# Patient Record
Sex: Male | Born: 1945
Health system: Southern US, Community
[De-identification: ages and names within clinical notes are randomized; demographics above are authoritative.]

## PROBLEM LIST (undated history)

## (undated) DIAGNOSIS — G629 Polyneuropathy, unspecified: Secondary | ICD-10-CM

## (undated) DIAGNOSIS — C50919 Malignant neoplasm of unspecified site of unspecified female breast: Secondary | ICD-10-CM

## (undated) DIAGNOSIS — K219 Gastro-esophageal reflux disease without esophagitis: Secondary | ICD-10-CM

## (undated) DIAGNOSIS — I10 Essential (primary) hypertension: Secondary | ICD-10-CM

## (undated) DIAGNOSIS — R413 Other amnesia: Secondary | ICD-10-CM

## (undated) DIAGNOSIS — J40 Bronchitis, not specified as acute or chronic: Secondary | ICD-10-CM

## (undated) DIAGNOSIS — F419 Anxiety disorder, unspecified: Secondary | ICD-10-CM

## (undated) DIAGNOSIS — R519 Headache, unspecified: Secondary | ICD-10-CM

## (undated) DIAGNOSIS — C9000 Multiple myeloma not having achieved remission: Secondary | ICD-10-CM

## (undated) DIAGNOSIS — J189 Pneumonia, unspecified organism: Secondary | ICD-10-CM

## (undated) DIAGNOSIS — Z9289 Personal history of other medical treatment: Secondary | ICD-10-CM

## (undated) DIAGNOSIS — H3530 Unspecified macular degeneration: Secondary | ICD-10-CM

## (undated) DIAGNOSIS — C7951 Secondary malignant neoplasm of bone: Secondary | ICD-10-CM

## (undated) DIAGNOSIS — R059 Cough, unspecified: Secondary | ICD-10-CM

## (undated) DIAGNOSIS — C50929 Malignant neoplasm of unspecified site of unspecified male breast: Secondary | ICD-10-CM

## (undated) HISTORY — PX: OTHER SURGICAL HISTORY: SHX169

## (undated) HISTORY — DX: Gastro-esophageal reflux disease without esophagitis: K21.9

## (undated) HISTORY — DX: Essential (primary) hypertension: I10

## (undated) HISTORY — DX: Secondary malignant neoplasm of bone: C79.51

## (undated) HISTORY — DX: Malignant neoplasm of unspecified site of unspecified female breast: C50.919

## (undated) HISTORY — DX: Anxiety disorder, unspecified: F41.9

## (undated) HISTORY — PX: BACK SURGERY: SHX140

## (undated) HISTORY — PX: BREAST BIOPSY: SHX20

## (undated) HISTORY — PX: HERNIA REPAIR: SHX51

## (undated) HISTORY — DX: Malignant neoplasm of unspecified site of unspecified male breast: C50.929

## (undated) HISTORY — DX: Unspecified macular degeneration: H35.30

## (undated) HISTORY — DX: Multiple myeloma not having achieved remission: C90.00

## (undated) HISTORY — PX: COLONOSCOPY: SHX174

---

## 2001-06-17 ENCOUNTER — Encounter: Payer: Self-pay | Admitting: Neurosurgery

## 2001-06-17 ENCOUNTER — Inpatient Hospital Stay (HOSPITAL_COMMUNITY): Admission: RE | Admit: 2001-06-17 | Discharge: 2001-06-17 | Payer: Self-pay | Admitting: Neurosurgery

## 2001-06-28 ENCOUNTER — Encounter: Payer: Self-pay | Admitting: Neurosurgery

## 2001-06-28 ENCOUNTER — Encounter: Admission: RE | Admit: 2001-06-28 | Discharge: 2001-06-28 | Payer: Self-pay | Admitting: Neurosurgery

## 2001-08-11 ENCOUNTER — Encounter: Payer: Self-pay | Admitting: Neurosurgery

## 2001-08-11 ENCOUNTER — Encounter: Admission: RE | Admit: 2001-08-11 | Discharge: 2001-08-11 | Payer: Self-pay | Admitting: Neurosurgery

## 2009-12-28 DIAGNOSIS — C50929 Malignant neoplasm of unspecified site of unspecified male breast: Secondary | ICD-10-CM

## 2009-12-28 DIAGNOSIS — C50919 Malignant neoplasm of unspecified site of unspecified female breast: Secondary | ICD-10-CM

## 2009-12-28 HISTORY — DX: Malignant neoplasm of unspecified site of unspecified male breast: C50.929

## 2009-12-28 HISTORY — DX: Malignant neoplasm of unspecified site of unspecified female breast: C50.919

## 2010-01-21 ENCOUNTER — Other Ambulatory Visit: Payer: Self-pay | Admitting: Orthopedic Surgery

## 2010-01-21 ENCOUNTER — Encounter (INDEPENDENT_AMBULATORY_CARE_PROVIDER_SITE_OTHER): Payer: Self-pay | Admitting: Orthopedic Surgery

## 2010-01-21 ENCOUNTER — Inpatient Hospital Stay (HOSPITAL_COMMUNITY): Admission: RE | Admit: 2010-01-21 | Discharge: 2010-01-22 | Payer: Self-pay | Admitting: Orthopedic Surgery

## 2010-01-27 ENCOUNTER — Ambulatory Visit: Admission: RE | Admit: 2010-01-27 | Discharge: 2010-03-14 | Payer: Self-pay | Admitting: Radiation Oncology

## 2010-03-09 ENCOUNTER — Ambulatory Visit: Payer: Self-pay | Admitting: Cardiology

## 2010-10-07 ENCOUNTER — Ambulatory Visit: Payer: Self-pay | Admitting: Internal Medicine

## 2010-10-07 DIAGNOSIS — K5900 Constipation, unspecified: Secondary | ICD-10-CM | POA: Insufficient documentation

## 2010-10-07 DIAGNOSIS — K219 Gastro-esophageal reflux disease without esophagitis: Secondary | ICD-10-CM | POA: Insufficient documentation

## 2010-10-07 DIAGNOSIS — K625 Hemorrhage of anus and rectum: Secondary | ICD-10-CM

## 2010-10-08 ENCOUNTER — Encounter: Payer: Self-pay | Admitting: Internal Medicine

## 2010-10-27 ENCOUNTER — Ambulatory Visit (HOSPITAL_COMMUNITY)
Admission: RE | Admit: 2010-10-27 | Discharge: 2010-10-27 | Payer: Self-pay | Source: Home / Self Care | Admitting: Internal Medicine

## 2010-10-27 ENCOUNTER — Ambulatory Visit: Payer: Self-pay | Admitting: Internal Medicine

## 2011-01-27 NOTE — Assessment & Plan Note (Signed)
Summary: rectal bleeding,consult for tcs/ss   Visit Type:  Initial Consult Referring Elfrieda Espino:  [name] Wavie Hashimi:  [name]  CC:  rectal bleeding.  History of Present Illness: Johnny Lucas is a pleasant 65 year old male who presents today as a new patient at the request of Dr. [name] due to rectal bleeding. He was recently diagnosed with breast cancer in January of 2011, and he states this has metastasized to the bone. He reports he experienced an episode of severe constipation one month ago; he woke up nauseated at 1am. He states he finally had a BM later that day but noted large amount of bright red blood in stool. He states he had abdominal pain with this episode. He experienced two more incidences of brbpr, yet he has had none since. Reports no prior history of blood in stool. States he has dealt with constipation "all my life". Reports taking miralax once per week. BM every other day, reports hard stools. Denies abdominal pain currently. No change in weight, no loss of appetite. c/o nocturnal GERD that awakens him from sleeping almost every night. uses tums 2-3x week. No prior colon/EGD in past. Current labs from PCP wnl.   Current Problems (verified): 1)  Constipation  (ICD-564.00) 2)  Gerd  (ICD-530.81) 3)  Rectal Bleeding  (ICD-569.3)  Current Medications (verified): 1)  Metoprolol Tartrate 100 Mg Tabs (Metoprolol Tartrate) .... Take 1 Tablet By Mouth Once A Day 2)  Tamoxifen Citrate 20 Mg Tabs (Tamoxifen Citrate) .... Take 1 Tablet By Mouth Once A Day 3)  Aspirin 325 Mg Tabs (Aspirin) .... Take 1 Tablet By Mouth Once A Day 4)  Occuvite .... Take 1 Tablet By Mouth Once A Day 5)  Vitamin C  1000 Mg .... Once Daily 6)  Sustenex .... Once Daily 7)  Hydromet 5-1.5 Mg/37ml Syrp (Hydrocodone-Homatropine) .... As Needed 8)  Ventolin Hfa 108 (90 Base) Mcg/act Aers (Albuterol Sulfate) .... As Needed 9)  Zometa  Iv .... Once Monthly 10)  Nexium 40 Mg Cpdr  (Esomeprazole Magnesium) .Marland Kitchen.. 1 By Mouth Daily For Acid Reflux  Allergies (verified): No Known Drug Allergies  Past History:  Past Medical History: GERD Hypertension Chronic bronchitis Breast Cancer, metastasis to bone (Jan 2011)  Past Surgical History: Humerus pin C2-3 fusion right inguinal hernia repair with mesh  Family History: Mother: healthy, deceased Father: healthy, deceased siblings: healthy No FH of Colon Cancer:  Social History: Patient has never smoked.  Alcohol Use - yes 2-3 beers per month  Patient does not get regular exercise.  Smoking Status:  never Does Patient Exercise:  no  Review of Systems General:  Denies fever, chills, and anorexia. Eyes:  Denies blurring, irritation, and discharge. ENT:  Denies sore throat, hoarseness, and difficulty swallowing. CV:  Denies chest pains and dyspnea on exertion. Resp:  Denies dyspnea at rest and wheezing. GI:  Complains of nausea, indigestion/heartburn, and bloody BM's; denies difficulty swallowing, pain on swallowing, and abdominal pain; one episode of nausea with constipation, . GU:  Denies urinary burning, urinary frequency, and nocturnal urination. MS:  Denies joint pain / LOM, joint stiffness, and joint deformity. Derm:  Denies rash, itching, and dry skin. Neuro:  Denies weakness, difficulty walking, and headache. Psych:  Denies depression, anxiety, and memory loss.  Vital Signs:  Patient profile:   65 year old male Height:      69 inches Weight:      210 pounds BMI:     31.12 Temp:  98.0 degrees F oral Pulse rate:   66 / minute BP sitting:   130 / 82  (left arm) Cuff size:   large  Vitals Entered By: Johnny Merl LPN (October 11, 624THL 11:18 AM)  Physical Exam  General:  Well developed, well nourished, no acute distress. Eyes:  conjuctiva clear, non-icteric Nose:  No deformity, discharge,  or lesions. Mouth:  No deformity or lesions, dentition normal. Lungs:  Clear throughout to  auscultation. Heart:  Regular rate and rhythm; no murmurs, rubs,  or bruits. Abdomen:  normal bowel sounds, without guarding, without rebound, no tenderness, and no masses.   Msk:  Symmetrical with no gross deformities. Normal posture. Pulses:  Normal pulses noted. Extremities:  No clubbing, cyanosis, edema or deformities noted. Neurologic:  Alert and  oriented x4;  grossly normal neurologically.  Impression & Recommendations:  Problem # 1:  RECTAL BLEEDING (ICD-29.91) 65 year old male with new onset of hematochezia approximately one month ago associated with abdominal pain, +nausea, constipation. Experienced three episodes yet has had no further bleeding.   EGD and colonscopy to be performed by Dr. Bridgette Habermann in the near future.  I have discussed risks and benefits which include, but are not limited to, bleeding, infection, perforation, or medication reaction.  The patient agrees with this plan and consent will be obtained.  Orders: Consultation Level III [id number]) Consultation Level IV [id number])  Problem # 2:  GERD (ICD-57.16) 65 year old male with nocturnal GERD, uses TUMS 3x/week. No prior EGD performed. Nexium 40 mg daily, disp#30  to [facility]. 3 sample boxes given to patient.  See #1  Problem # 3:  CONSTIPATION (ICD-564.00)  history of constipation, currently has BM every other day.   Continue current regimen, increase fluid intake. Colonoscopy to be performed in near future.   Orders: Consultation Level IV [id number]) Prescriptions: NEXIUM 40 MG CPDR (ESOMEPRAZOLE MAGNESIUM) 1 by mouth daily for acid reflux  #31 x 5   Entered and Authorized by:   Andria Meuse FNP-BC   Signed by:   Andria Meuse FNP-BC on 10/07/2010   Method used:   Electronically to        [facility] (retail)       [address]       [place], [place]  [address]       Ph: [phone]       Fax: [phone]   RxID:   [id number]  #3 boxes of samples given

## 2011-01-27 NOTE — Letter (Signed)
Summary: TCS/EGD ORDER  TCS/EGD ORDER   Imported By: Sofie Rower 10/07/2010 12:09:43  _____________________________________________________________________  External Attachment:    Type:   Image     Comment:   External Document

## 2011-01-27 NOTE — Letter (Signed)
Summary: REFERRAL FROM DR Nadara Mustard  REFERRAL FROM DR Nadara Mustard   Imported By: Hoy Morn 10/08/2010 09:15:23  _____________________________________________________________________  External Attachment:    Type:   Image     Comment:   External Document

## 2011-03-15 LAB — DIFFERENTIAL
Basophils Absolute: 0 10*3/uL (ref 0.0–0.1)
Lymphocytes Relative: 28 % (ref 12–46)
Monocytes Absolute: 0.4 10*3/uL (ref 0.1–1.0)
Monocytes Relative: 8 % (ref 3–12)
Neutro Abs: 3.1 10*3/uL (ref 1.7–7.7)

## 2011-03-15 LAB — CBC
HCT: 39 % (ref 39.0–52.0)
MCHC: 35.3 g/dL (ref 30.0–36.0)
MCV: 97.6 fL (ref 78.0–100.0)
Platelets: 165 10*3/uL (ref 150–400)
RDW: 14.3 % (ref 11.5–15.5)

## 2011-03-15 LAB — COMPREHENSIVE METABOLIC PANEL
Albumin: 3.9 g/dL (ref 3.5–5.2)
BUN: 11 mg/dL (ref 6–23)
Creatinine, Ser: 0.97 mg/dL (ref 0.4–1.5)
Total Bilirubin: 0.6 mg/dL (ref 0.3–1.2)
Total Protein: 6.3 g/dL (ref 6.0–8.3)

## 2011-03-15 LAB — APTT: aPTT: 28 seconds (ref 24–37)

## 2011-05-15 NOTE — H&P (Signed)
[place]  Patient:    Johnny Lucas, Johnny Lucas                       MRN: [id number] Adm. Date:  [id number] Attending:  Minda Meo                         History and Physical  HISTORY OF PRESENT ILLNESS:  Mr. Braff is a gentleman who was seen by me in my office on two occasions because of neck pain with radiation down to the left shoulder and numbness involving the index, middle and ring fingers.  He was quite miserable.  He was seen by me, we started conservative treatment and in view of no improvement, we decided about surgery.  He was fairly reluctant, nevertheless, he did not know what to do.  His wife was present also and was of the opinion that it was up to him.  Finally, the patient realized that it is getting worse, the weakness is not any better and he is quite miserable. He denies any problem with the right upper extremity.  He denies any problem with bladder or bowel.  PAST MEDICAL HISTORY:  Inguinal hernia in 1994.  SOCIAL HISTORY:  He does not smoke.  He drinks socially.  FAMILY HISTORY:  Parents are 89 and 1 years old, in good condition.  REVIEW OF SYSTEMS:  The patient has macular degeneration which makes him legally blind.  He complains of back pain and some ringing in the ears.  PHYSICAL EXAMINATION:  HEENT:  Normal.  NECK:  He is able to flex but extension and lateralization produce pain that goes to the left shoulder.  LUNGS:  Clear.  HEART:  Heart sounds normal.  ABDOMEN:  Normal.  EXTREMITIES:  Normal pulses.  NEUROLOGIC:  Mental status normal.  Cranial nerves normal.  Strength 5/5 except that the left triceps is like 1/5 with hypotonia of the pectoralis major and weakening of the hypothenar muscle.  Reflexes are present and symmetrical, 2+, with absence of the left triceps.  Coordination normal.  Gait normal.  IMAGING STUDY:  The x-ray showed that indeed he has a large herniated disk at the level of  C6-C7 with displacement of the thecal sac and displacement of the C7 nerve root.  There is some spondylosis at the level of 4-5.  CLINICAL IMPRESSION:  Left C7 radiculopathy secondary to herniated disk.  RECOMMENDATION:  The patient is being admitted for further.  Procedure will be anterior cervical diskectomy using bone bank and a plate.  He knows about the risks such as infection, CSF leak, worsening of the pain, paralysis, failure of the graft, failure of the plate and need for further surgery. DD:  06/17/01 TD:  06/17/01 Job: [id number]

## 2011-05-15 NOTE — Op Note (Signed)
[place]  Patient:    Johnny Lucas, Johnny Lucas                       MRN: [id number] Proc. Date: 06/17/01 Adm. Date:  [id number] Attending:  Minda Meo                           Operative Report  PREOPERATIVE DIAGNOSIS:  Left C7 radiculopathy secondary to herniated disk at C6-7.  POSTOPERATIVE DIAGNOSIS:  Left C7 radiculopathy secondary to herniated disk at C6-7.  PROCEDURE: 1. Anterior C6-7 diskectomy, removal of a large fragment on the left side. 2. Microscope. 3. Iliac crest bone graft 7 mm, followed by Premier plate.  SURGEON:  Zigmund Daniel. Joya Salm, M.D.  ASSISTANT:  _____.  CLINICAL HISTORY:  Mr. Ferrin is a gentleman complaining of neck and left upper extremity pain for several weeks.  He already has atrophy of the left side secondary thoracic grade 1 over 5 with hypotonia of the pectoralis major.  MRI showed a large herniated disk at the level of C6-7 central and to to the left.  The patient failed with conservative treatment.  In view of no improvement and worsening of the pain, he decided to go ahead with surgery.  DESCRIPTION OF PROCEDURE:  The patient was taken to the OR and after intubation, the left side of the neck was prepped with Betadine.  A transverse incision was made through the skin, and the dissection was carried down all the way to through the platysma, down to the cervical spine.  Because of big shoulders, we used two needles.  One was at the level of 5-6, the other one was at the level of 6-7.  We opened the anterior ligament at the level of C6-7, and we brought the microscope into the area.  With the microcurette, with the drill, we removed quite a bit of degenerative disk disease.  Then there was an opening in the posterior ligament, and there were like three or four pieces of disk going into the left side.  Decompression of the C7 nerve root as well as decompression of the right side as well as the spinal cord  was made.  Then the end plates were drilled, and a 7 mm bone graft was inserted. This was followed by a Premier plate using four screws.  Lateral C-spine showed good position of the graft and the plate.  From then on, the area was irrigated and closed with Vicryl and Steri-Strips. DD:  06/17/01 TD:  06/17/01 Job: [id number]

## 2011-08-13 ENCOUNTER — Encounter: Payer: Self-pay | Admitting: Gastroenterology

## 2011-08-13 ENCOUNTER — Other Ambulatory Visit: Payer: Self-pay | Admitting: Gastroenterology

## 2011-08-13 DIAGNOSIS — K219 Gastro-esophageal reflux disease without esophagitis: Secondary | ICD-10-CM

## 2011-08-13 MED ORDER — OMEPRAZOLE 20 MG PO CPDR: 20.0000 mg | DELAYED_RELEASE_CAPSULE | Freq: Every day | ORAL | Status: DC

## 2011-08-13 NOTE — Progress Notes (Signed)
  Insurance will only cover Prilosec or Protonix. Will start with Prilosec.

## 2012-05-10 ENCOUNTER — Encounter: Payer: Self-pay | Admitting: Internal Medicine

## 2012-07-05 ENCOUNTER — Encounter: Payer: Self-pay | Admitting: Internal Medicine

## 2012-07-05 DIAGNOSIS — C50929 Malignant neoplasm of unspecified site of unspecified male breast: Secondary | ICD-10-CM

## 2012-10-17 ENCOUNTER — Encounter: Payer: 59 | Admitting: Internal Medicine

## 2012-10-17 DIAGNOSIS — Z23 Encounter for immunization: Secondary | ICD-10-CM

## 2012-10-17 DIAGNOSIS — C50929 Malignant neoplasm of unspecified site of unspecified male breast: Secondary | ICD-10-CM

## 2012-10-17 DIAGNOSIS — C7951 Secondary malignant neoplasm of bone: Secondary | ICD-10-CM

## 2012-10-17 DIAGNOSIS — C7952 Secondary malignant neoplasm of bone marrow: Secondary | ICD-10-CM

## 2013-01-12 ENCOUNTER — Encounter: Payer: 59 | Admitting: Internal Medicine

## 2013-01-12 DIAGNOSIS — C7951 Secondary malignant neoplasm of bone: Secondary | ICD-10-CM

## 2013-01-12 DIAGNOSIS — C7952 Secondary malignant neoplasm of bone marrow: Secondary | ICD-10-CM

## 2013-01-12 DIAGNOSIS — C50929 Malignant neoplasm of unspecified site of unspecified male breast: Secondary | ICD-10-CM

## 2013-04-13 ENCOUNTER — Encounter: Payer: 59 | Admitting: Internal Medicine

## 2013-04-13 DIAGNOSIS — C7951 Secondary malignant neoplasm of bone: Secondary | ICD-10-CM

## 2013-04-13 DIAGNOSIS — C7952 Secondary malignant neoplasm of bone marrow: Secondary | ICD-10-CM

## 2013-04-13 DIAGNOSIS — C50929 Malignant neoplasm of unspecified site of unspecified male breast: Secondary | ICD-10-CM

## 2013-07-06 DIAGNOSIS — C50929 Malignant neoplasm of unspecified site of unspecified male breast: Secondary | ICD-10-CM

## 2013-07-06 DIAGNOSIS — C7951 Secondary malignant neoplasm of bone: Secondary | ICD-10-CM

## 2013-07-06 DIAGNOSIS — C7952 Secondary malignant neoplasm of bone marrow: Secondary | ICD-10-CM

## 2013-07-18 DIAGNOSIS — C7952 Secondary malignant neoplasm of bone marrow: Secondary | ICD-10-CM

## 2013-07-18 DIAGNOSIS — C50929 Malignant neoplasm of unspecified site of unspecified male breast: Secondary | ICD-10-CM

## 2013-07-18 DIAGNOSIS — C7951 Secondary malignant neoplasm of bone: Secondary | ICD-10-CM

## 2013-09-28 DIAGNOSIS — R232 Flushing: Secondary | ICD-10-CM

## 2013-09-28 DIAGNOSIS — C50929 Malignant neoplasm of unspecified site of unspecified male breast: Secondary | ICD-10-CM

## 2013-09-28 DIAGNOSIS — Z23 Encounter for immunization: Secondary | ICD-10-CM

## 2013-09-28 DIAGNOSIS — C7952 Secondary malignant neoplasm of bone marrow: Secondary | ICD-10-CM

## 2013-09-28 DIAGNOSIS — C7951 Secondary malignant neoplasm of bone: Secondary | ICD-10-CM

## 2013-11-12 DIAGNOSIS — G459 Transient cerebral ischemic attack, unspecified: Secondary | ICD-10-CM | POA: Insufficient documentation

## 2014-01-03 ENCOUNTER — Telehealth: Payer: Self-pay | Admitting: Genetic Counselor

## 2014-01-03 NOTE — Telephone Encounter (Signed)
LVOM FOR PT TO RETURN CALL IN RE TO REFERRAL.  °

## 2014-01-11 ENCOUNTER — Other Ambulatory Visit: Payer: 59

## 2014-01-11 ENCOUNTER — Encounter (INDEPENDENT_AMBULATORY_CARE_PROVIDER_SITE_OTHER): Payer: Self-pay

## 2014-01-11 ENCOUNTER — Encounter: Payer: Self-pay | Admitting: Genetic Counselor

## 2014-01-11 ENCOUNTER — Ambulatory Visit (HOSPITAL_BASED_OUTPATIENT_CLINIC_OR_DEPARTMENT_OTHER): Payer: Medicare Other | Admitting: Genetic Counselor

## 2014-01-11 DIAGNOSIS — IMO0002 Reserved for concepts with insufficient information to code with codable children: Secondary | ICD-10-CM

## 2014-01-11 DIAGNOSIS — C7951 Secondary malignant neoplasm of bone: Secondary | ICD-10-CM

## 2014-01-11 DIAGNOSIS — C50929 Malignant neoplasm of unspecified site of unspecified male breast: Secondary | ICD-10-CM

## 2014-01-11 DIAGNOSIS — C7952 Secondary malignant neoplasm of bone marrow: Secondary | ICD-10-CM

## 2014-01-11 NOTE — Progress Notes (Signed)
Dr.  Audree Camel requested a consultation for genetic counseling and risk assessment for Johnny Lucas, a 68 y.o. male, for discussion of his personal history of breast cancer.  He presents to clinic today to discuss the possibility of a genetic predisposition to cancer, and to further clarify his risks, as well as his family members' risks for cancer.   HISTORY OF PRESENT ILLNESS: In 2011, at the age of 65, Johnny Lucas was diagnosed with stave IV breast cancer. This was treated with radiation to the bone metastasis in his arm and tamoxifen.  His tumor was ER+.   Past Medical History  Diagnosis Date  . Breast cancer 2011    Stave IV breast cancer; radiation and tamoxifen    No past surgical history on file.  History   Social History  . Marital Status: Married    Spouse Name: N/A    Number of Children: 3  . Years of Education: N/A   Social History Main Topics  . Smoking status: Not on file  . Smokeless tobacco: Not on file  . Alcohol Use: Not on file  . Drug Use: Not on file  . Sexual Activity: Not on file   Other Topics Concern  . Not on file   Social History Narrative  . No narrative on file    FAMILY HISTORY:  We obtained a detailed, 4-generation family history.  Significant diagnoses are listed below: Family History  Problem Relation Age of Onset  . Stroke Mother   . Cancer Maternal Aunt     cancer NOS; died in her 75s  . Lung cancer Maternal Uncle     smoker  [place] has two siblings he does not have contact with.  None of his cousins on his mother or father's side of the family have cancer that he is aware of.  Patient's maternal ancestors are of American [place] descent, and paternal ancestors are of unknown descent. There is no reported Ashkenazi Jewish ancestry. There is no known consanguinity.  GENETIC COUNSELING ASSESSMENT: Johnny Lucas is a 68 y.o. male with a personal history of breast cancer which somewhat suggestive of a hereditary breast and  ovarian cancer syndrome and predisposition to cancer. We, therefore, discussed and recommended the following at today's visit.   DISCUSSION: We reviewed the characteristics, features and inheritance patterns of hereditary cancer syndromes. We also discussed genetic testing, including the appropriate family members to test, the process of testing, insurance coverage and turn-around-time for results. We discussed hereditary breast cancer syndromes and genes that increase the risk for male breast cancer.  This included BRC1, BRCA2, PALB2 and CHEK2 mutations.  Johnny Lucas is concerned about genetic discrimination so we discussed the Genetic Information Nondiscrimination Act (GINA) and that insurance companies would rather pay for testing and preventive care than for cancer. His wife, son and grandson were in attendance and all of their questions were answered.  PLAN: After considering the risks, benefits, and limitations, Johnny Lucas provided informed consent to pursue genetic testing and the blood sample will be sent to [facility] for analysis of the Breast/Ovarian Cancer Panel. We discussed the implications of a positive, negative and/ or variant of uncertain significance genetic test result. Results should be available within approximately 3 weeks' time, at which point they will be disclosed by telephone to Johnny Lucas, as will any additional recommendations warranted by these results. Johnny Lucas will receive a summary of his genetic counseling visit and a copy of  his results once available. This information will also be available in Epic. We encouraged Johnny Lucas to remain in contact with cancer genetics annually so that we can continuously update the family history and inform him of any changes in cancer genetics and testing that may be of benefit for his family. Johnny Lucas's questions were answered to his satisfaction today. Our contact information was provided should additional  questions or concerns arise.  The patient was seen for a total of 60 minutes, greater than 50% of which was spent face-to-face counseling.  This note will also be sent to the referring provider via the electronic medical record. The patient will be supplied with a summary of this genetic counseling discussion as well as educational information on the discussed hereditary cancer syndromes following the conclusion of their visit.   Patient was discussed with Dr. Marcy Lucas.   _______________________________________________________________________ For Office Staff:  Number of people involved in session: 4 Was an Intern/ student involved with case: no

## 2014-01-24 ENCOUNTER — Encounter: Payer: Self-pay | Admitting: Genetic Counselor

## 2014-01-24 ENCOUNTER — Telehealth: Payer: Self-pay | Admitting: Genetic Counselor

## 2014-01-24 NOTE — Telephone Encounter (Signed)
Left message with wife that we had his results back and to have him call.

## 2014-01-24 NOTE — Telephone Encounter (Signed)
Johnny Lucas returned my call.  Revealed negative genetic test results but that he has a PTEN VUS.  Discussed that he is eligible for a genetics study through [facility] where he would not need to travel, but he may be asked to complete survey's.  I will email him at [email or web address] the information about the study.

## 2014-04-09 DIAGNOSIS — C50929 Malignant neoplasm of unspecified site of unspecified male breast: Secondary | ICD-10-CM | POA: Diagnosis not present

## 2014-10-03 ENCOUNTER — Telehealth: Payer: Self-pay | Admitting: Genetic Counselor

## 2014-10-03 NOTE — Telephone Encounter (Signed)
Johnny Lucas called to get another copy of his genetic test report.  I printed it out and sent it to the volunteers for mailing.

## 2014-12-26 DIAGNOSIS — C50922 Malignant neoplasm of unspecified site of left male breast: Secondary | ICD-10-CM | POA: Diagnosis not present

## 2014-12-26 DIAGNOSIS — C7951 Secondary malignant neoplasm of bone: Secondary | ICD-10-CM | POA: Diagnosis not present

## 2015-03-27 DIAGNOSIS — C50022 Malignant neoplasm of nipple and areola, left male breast: Secondary | ICD-10-CM | POA: Diagnosis not present

## 2015-03-27 DIAGNOSIS — C7951 Secondary malignant neoplasm of bone: Secondary | ICD-10-CM | POA: Diagnosis not present

## 2015-03-27 DIAGNOSIS — C50929 Malignant neoplasm of unspecified site of unspecified male breast: Secondary | ICD-10-CM | POA: Diagnosis not present

## 2015-03-27 DIAGNOSIS — Z79810 Long term (current) use of selective estrogen receptor modulators (SERMs): Secondary | ICD-10-CM | POA: Diagnosis not present

## 2015-07-26 DIAGNOSIS — C7951 Secondary malignant neoplasm of bone: Secondary | ICD-10-CM | POA: Diagnosis not present

## 2015-07-26 DIAGNOSIS — C50022 Malignant neoplasm of nipple and areola, left male breast: Secondary | ICD-10-CM | POA: Diagnosis not present

## 2015-10-21 DIAGNOSIS — Z853 Personal history of malignant neoplasm of breast: Secondary | ICD-10-CM | POA: Diagnosis not present

## 2015-10-21 DIAGNOSIS — C50929 Malignant neoplasm of unspecified site of unspecified male breast: Secondary | ICD-10-CM | POA: Diagnosis not present

## 2015-10-21 DIAGNOSIS — C7951 Secondary malignant neoplasm of bone: Secondary | ICD-10-CM | POA: Diagnosis not present

## 2015-10-23 DIAGNOSIS — C50929 Malignant neoplasm of unspecified site of unspecified male breast: Secondary | ICD-10-CM | POA: Diagnosis not present

## 2015-10-23 DIAGNOSIS — K769 Liver disease, unspecified: Secondary | ICD-10-CM | POA: Diagnosis not present

## 2015-10-23 DIAGNOSIS — C7951 Secondary malignant neoplasm of bone: Secondary | ICD-10-CM | POA: Diagnosis not present

## 2015-10-25 DIAGNOSIS — C50921 Malignant neoplasm of unspecified site of right male breast: Secondary | ICD-10-CM | POA: Diagnosis not present

## 2015-10-25 DIAGNOSIS — Z79810 Long term (current) use of selective estrogen receptor modulators (SERMs): Secondary | ICD-10-CM | POA: Insufficient documentation

## 2015-10-25 DIAGNOSIS — C7951 Secondary malignant neoplasm of bone: Secondary | ICD-10-CM | POA: Diagnosis not present

## 2015-10-25 DIAGNOSIS — C50922 Malignant neoplasm of unspecified site of left male breast: Secondary | ICD-10-CM | POA: Diagnosis not present

## 2015-10-25 DIAGNOSIS — Z23 Encounter for immunization: Secondary | ICD-10-CM | POA: Diagnosis not present

## 2016-01-21 DIAGNOSIS — C7951 Secondary malignant neoplasm of bone: Secondary | ICD-10-CM | POA: Diagnosis not present

## 2016-01-21 DIAGNOSIS — C50922 Malignant neoplasm of unspecified site of left male breast: Secondary | ICD-10-CM | POA: Diagnosis not present

## 2016-01-22 DIAGNOSIS — C50922 Malignant neoplasm of unspecified site of left male breast: Secondary | ICD-10-CM | POA: Diagnosis not present

## 2016-01-22 DIAGNOSIS — C50921 Malignant neoplasm of unspecified site of right male breast: Secondary | ICD-10-CM | POA: Diagnosis not present

## 2016-01-22 DIAGNOSIS — Z79810 Long term (current) use of selective estrogen receptor modulators (SERMs): Secondary | ICD-10-CM | POA: Diagnosis not present

## 2016-01-22 DIAGNOSIS — K5900 Constipation, unspecified: Secondary | ICD-10-CM | POA: Diagnosis not present

## 2016-01-22 DIAGNOSIS — C7951 Secondary malignant neoplasm of bone: Secondary | ICD-10-CM | POA: Diagnosis not present

## 2016-01-22 DIAGNOSIS — R42 Dizziness and giddiness: Secondary | ICD-10-CM | POA: Diagnosis not present

## 2016-01-23 DIAGNOSIS — K5900 Constipation, unspecified: Secondary | ICD-10-CM | POA: Diagnosis not present

## 2016-01-23 DIAGNOSIS — C50122 Malignant neoplasm of central portion of left male breast: Secondary | ICD-10-CM | POA: Diagnosis not present

## 2016-04-04 DIAGNOSIS — J209 Acute bronchitis, unspecified: Secondary | ICD-10-CM | POA: Diagnosis not present

## 2016-04-04 DIAGNOSIS — R05 Cough: Secondary | ICD-10-CM | POA: Diagnosis not present

## 2016-04-15 DIAGNOSIS — M7701 Medial epicondylitis, right elbow: Secondary | ICD-10-CM | POA: Diagnosis not present

## 2016-04-23 ENCOUNTER — Other Ambulatory Visit (HOSPITAL_COMMUNITY): Payer: Self-pay | Admitting: Internal Medicine

## 2016-04-23 DIAGNOSIS — Z79810 Long term (current) use of selective estrogen receptor modulators (SERMs): Secondary | ICD-10-CM | POA: Diagnosis not present

## 2016-04-23 DIAGNOSIS — C50122 Malignant neoplasm of central portion of left male breast: Secondary | ICD-10-CM | POA: Diagnosis not present

## 2016-04-23 DIAGNOSIS — C50922 Malignant neoplasm of unspecified site of left male breast: Secondary | ICD-10-CM

## 2016-04-23 DIAGNOSIS — C50929 Malignant neoplasm of unspecified site of unspecified male breast: Secondary | ICD-10-CM | POA: Diagnosis not present

## 2016-04-23 DIAGNOSIS — C7951 Secondary malignant neoplasm of bone: Secondary | ICD-10-CM | POA: Diagnosis not present

## 2016-04-23 DIAGNOSIS — F419 Anxiety disorder, unspecified: Secondary | ICD-10-CM | POA: Diagnosis not present

## 2016-05-13 DIAGNOSIS — M7702 Medial epicondylitis, left elbow: Secondary | ICD-10-CM | POA: Diagnosis not present

## 2016-05-13 DIAGNOSIS — M7701 Medial epicondylitis, right elbow: Secondary | ICD-10-CM | POA: Diagnosis not present

## 2016-06-09 ENCOUNTER — Ambulatory Visit (HOSPITAL_COMMUNITY)
Admission: RE | Admit: 2016-06-09 | Discharge: 2016-06-09 | Disposition: A | Discharge status: Home / Self Care | Payer: Medicare Other | Source: Ambulatory Visit | Attending: Internal Medicine | Admitting: Internal Medicine

## 2016-06-09 DIAGNOSIS — C7951 Secondary malignant neoplasm of bone: Secondary | ICD-10-CM | POA: Insufficient documentation

## 2016-06-09 DIAGNOSIS — C50922 Malignant neoplasm of unspecified site of left male breast: Secondary | ICD-10-CM | POA: Insufficient documentation

## 2016-06-09 DIAGNOSIS — R938 Abnormal findings on diagnostic imaging of other specified body structures: Secondary | ICD-10-CM | POA: Insufficient documentation

## 2016-06-09 DIAGNOSIS — Z79810 Long term (current) use of selective estrogen receptor modulators (SERMs): Secondary | ICD-10-CM | POA: Insufficient documentation

## 2016-06-09 DIAGNOSIS — C50122 Malignant neoplasm of central portion of left male breast: Secondary | ICD-10-CM | POA: Diagnosis not present

## 2016-06-09 DIAGNOSIS — I2510 Atherosclerotic heart disease of native coronary artery without angina pectoris: Secondary | ICD-10-CM | POA: Diagnosis not present

## 2016-06-09 LAB — GLUCOSE, CAPILLARY: GLUCOSE-CAPILLARY: 103 mg/dL — AB (ref 65–99)

## 2016-06-09 MED ORDER — FLUDEOXYGLUCOSE F - 18 (FDG) INJECTION: 11.8000 | Freq: Once | INTRAVENOUS | Status: DC | PRN

## 2016-06-10 DIAGNOSIS — Z170 Estrogen receptor positive status [ER+]: Secondary | ICD-10-CM | POA: Diagnosis not present

## 2016-06-10 DIAGNOSIS — F419 Anxiety disorder, unspecified: Secondary | ICD-10-CM | POA: Diagnosis not present

## 2016-06-10 DIAGNOSIS — C7951 Secondary malignant neoplasm of bone: Secondary | ICD-10-CM | POA: Diagnosis not present

## 2016-06-10 DIAGNOSIS — Z79810 Long term (current) use of selective estrogen receptor modulators (SERMs): Secondary | ICD-10-CM | POA: Diagnosis not present

## 2016-06-10 DIAGNOSIS — C50122 Malignant neoplasm of central portion of left male breast: Secondary | ICD-10-CM | POA: Diagnosis not present

## 2016-06-10 DIAGNOSIS — C50921 Malignant neoplasm of unspecified site of right male breast: Secondary | ICD-10-CM | POA: Diagnosis not present

## 2016-06-10 DIAGNOSIS — Z9012 Acquired absence of left breast and nipple: Secondary | ICD-10-CM | POA: Diagnosis not present

## 2016-06-17 DIAGNOSIS — M7702 Medial epicondylitis, left elbow: Secondary | ICD-10-CM | POA: Diagnosis not present

## 2016-07-22 ENCOUNTER — Ambulatory Visit (HOSPITAL_COMMUNITY): Payer: Medicare Other

## 2016-07-25 DIAGNOSIS — Z6830 Body mass index (BMI) 30.0-30.9, adult: Secondary | ICD-10-CM | POA: Diagnosis not present

## 2016-07-25 DIAGNOSIS — M545 Low back pain: Secondary | ICD-10-CM | POA: Diagnosis not present

## 2016-07-25 DIAGNOSIS — M5416 Radiculopathy, lumbar region: Secondary | ICD-10-CM | POA: Diagnosis not present

## 2016-08-20 DIAGNOSIS — B351 Tinea unguium: Secondary | ICD-10-CM | POA: Diagnosis not present

## 2016-08-20 DIAGNOSIS — M79671 Pain in right foot: Secondary | ICD-10-CM | POA: Diagnosis not present

## 2016-08-20 DIAGNOSIS — M79672 Pain in left foot: Secondary | ICD-10-CM | POA: Diagnosis not present

## 2016-08-28 DIAGNOSIS — M5416 Radiculopathy, lumbar region: Secondary | ICD-10-CM | POA: Diagnosis not present

## 2016-08-28 DIAGNOSIS — I10 Essential (primary) hypertension: Secondary | ICD-10-CM | POA: Diagnosis not present

## 2016-08-28 DIAGNOSIS — J300 Vasomotor rhinitis: Secondary | ICD-10-CM | POA: Diagnosis not present

## 2016-08-28 DIAGNOSIS — J209 Acute bronchitis, unspecified: Secondary | ICD-10-CM | POA: Diagnosis not present

## 2016-08-28 DIAGNOSIS — R971 Elevated cancer antigen 125 [CA 125]: Secondary | ICD-10-CM | POA: Diagnosis not present

## 2016-08-28 DIAGNOSIS — M545 Low back pain: Secondary | ICD-10-CM | POA: Diagnosis not present

## 2016-09-02 DIAGNOSIS — Z6829 Body mass index (BMI) 29.0-29.9, adult: Secondary | ICD-10-CM | POA: Diagnosis not present

## 2016-09-02 DIAGNOSIS — C50929 Malignant neoplasm of unspecified site of unspecified male breast: Secondary | ICD-10-CM | POA: Diagnosis not present

## 2016-09-02 DIAGNOSIS — M5416 Radiculopathy, lumbar region: Secondary | ICD-10-CM | POA: Diagnosis not present

## 2016-09-02 DIAGNOSIS — M545 Low back pain: Secondary | ICD-10-CM | POA: Diagnosis not present

## 2016-09-04 DIAGNOSIS — Z23 Encounter for immunization: Secondary | ICD-10-CM | POA: Diagnosis not present

## 2016-09-10 ENCOUNTER — Encounter (HOSPITAL_COMMUNITY): Payer: Self-pay | Admitting: Hematology & Oncology

## 2016-09-10 ENCOUNTER — Encounter (HOSPITAL_BASED_OUTPATIENT_CLINIC_OR_DEPARTMENT_OTHER): Payer: Medicare Other

## 2016-09-10 ENCOUNTER — Encounter (HOSPITAL_COMMUNITY): Payer: Medicare Other | Attending: Hematology & Oncology | Admitting: Hematology & Oncology

## 2016-09-10 VITALS — BP 146/84 | HR 72 | Temp 98.9°F | Resp 16 | Ht 68.0 in | Wt 208.6 lb

## 2016-09-10 DIAGNOSIS — C50122 Malignant neoplasm of central portion of left male breast: Secondary | ICD-10-CM

## 2016-09-10 DIAGNOSIS — Z79899 Other long term (current) drug therapy: Secondary | ICD-10-CM

## 2016-09-10 DIAGNOSIS — C7951 Secondary malignant neoplasm of bone: Secondary | ICD-10-CM | POA: Insufficient documentation

## 2016-09-10 DIAGNOSIS — Z801 Family history of malignant neoplasm of trachea, bronchus and lung: Secondary | ICD-10-CM

## 2016-09-10 DIAGNOSIS — Z809 Family history of malignant neoplasm, unspecified: Secondary | ICD-10-CM

## 2016-09-10 DIAGNOSIS — M1990 Unspecified osteoarthritis, unspecified site: Secondary | ICD-10-CM | POA: Insufficient documentation

## 2016-09-10 DIAGNOSIS — Z5181 Encounter for therapeutic drug level monitoring: Secondary | ICD-10-CM

## 2016-09-10 DIAGNOSIS — H3500 Unspecified background retinopathy: Secondary | ICD-10-CM | POA: Insufficient documentation

## 2016-09-10 DIAGNOSIS — Z79810 Long term (current) use of selective estrogen receptor modulators (SERMs): Secondary | ICD-10-CM

## 2016-09-10 HISTORY — DX: Secondary malignant neoplasm of bone: C79.51

## 2016-09-10 MED ORDER — DENOSUMAB 120 MG/1.7ML ~~LOC~~ SOLN
120.0000 mg | Freq: Once | SUBCUTANEOUS | Status: AC
  Administered 2016-09-10: 120 mg via SUBCUTANEOUS
  Filled 2016-09-10: qty 1.7

## 2016-09-10 NOTE — Progress Notes (Signed)
Johnny Lucas presents today for injection per MD orders. Xgeva  120mg  administer in the left Upper Arm. Administration without incident. Patient tolerated well.

## 2016-09-10 NOTE — Patient Instructions (Addendum)
[place] at [facility] Discharge Instructions  RECOMMENDATIONS MADE BY THE CONSULTANT AND ANY TEST RESULTS WILL BE SENT TO YOUR REFERRING PHYSICIAN.  You saw Dr. Whitney Muse today. We will get radiation and path records from [place]. You will be scheduled for PET scan. You will be scheduled for Xgeva every 3 months- starting asap. Follow up after PET scan.  Thank you for choosing [place] at [facility] to provide your oncology and hematology care.  To afford each patient quality time with our provider, please arrive at least 15 minutes before your scheduled appointment time.   Beginning January 23rd 2017 lab work for the [facility] will be done in the  Main lab at [facility] on 1st floor. If you have a lab appointment with the [facility] please come in thru the  Main Entrance and check in at the main information desk  You need to re-schedule your appointment should you arrive 10 or more minutes late.  We strive to give you quality time with our providers, and arriving late affects you and other patients whose appointments are after yours.  Also, if you no show three or more times for appointments you may be dismissed from the clinic at the providers discretion.     Again, thank you for choosing [facility].  Our hope is that these requests will decrease the amount of time that you wait before being seen by our physicians.       _____________________________________________________________  Should you have questions after your visit to [facility], please contact our office at [phone] between the hours of 8:30 a.m. and 4:30 p.m.  Voicemails left after 4:30 p.m. will not be returned until the following business day.  For prescription refill requests, have your pharmacy contact our office.         Resources For Cancer Patients and their Caregivers ? American Cancer Society: Can assist with transportation,  wigs, general needs, runs Look Good Feel Better.        631 400 5952 ? Cancer Care: Provides financial assistance, online support groups, medication/co-pay assistance.  1-800-813-HOPE 478-039-7316) ? [place] Assists [place] cancer patients and their families through emotional , educational and financial support.  217-446-5300 ? [place] DSS Where to apply for food stamps, Medicaid and utility assistance. (502) 563-3629 ? [facility]: Transportation to medical appointments. (561) 841-9502 ? Social Security Administration: May apply for disability if have a Stage IV cancer. 5484910289 534 468 1513 ? [employer], Disability and Transit Services: Assists with nutrition, care and transit needs. [place] Support Programs: @10RELATIVEDAYS @ > Cancer Support Group  2nd Tuesday of the month 1pm-2pm, Journey Room  > Creative Journey  3rd Tuesday of the month 1130am-1pm, Journey Room  > Look Good Feel Better  1st Wednesday of the month 10am-12 noon, Journey Room (Call [place] to register [phone])

## 2016-09-10 NOTE — Progress Notes (Signed)
[place] NOTE  Patient Care Team: Johnny Percy, MD as PCP - General (Family Medicine)  CHIEF COMPLAINTS/PURPOSE OF CONSULTATION:    Breast cancer, male (Johnny Lucas)   12/31/2009 Initial Biopsy    Biopsy of L breast       12/31/2009 Pathology Results    Invasive ductal carcinoma, ER/PR+, HER 2 negative      12/31/2009 Imaging    Ultrasound showing a 2.43 x 1.85 x 3 cm hypoechoic spiculated mass in the 12 o clock L breast retroareolar region      01/01/2010 -  Anti-estrogen oral therapy    Tamoxifen 20 mg daily      01/06/2010 Imaging    Bone scan abnormal uptake in the diaphysis of the R humerus, abnormal in the R third, fifth and sixth ribs, lesion also noted in the sternum.      02/03/2010 Surgery    Rod placement and fixation of R humerus by Johnny Lucas      02/05/2010 - 02/18/2010 Radiation Therapy    30Gy in 10 fractions of 3 Gy per fraction to R pathologic fracture      03/11/2010 -  Chemotherapy    Denosumab monthly, now every 3 months. Started at [facility]       06/09/2016 Imaging    Three hypermetabolic osseous lesions in the sternum, left ilium and right ilium, as discussed above, likely represent osseous metastases. At this time, these are not recognizable on the CT images. 2. No extra skeletal metastatic disease identified in the neck, chest, abdomen or pelvis.       HISTORY OF PRESENTING ILLNESS:  Johnny Lucas 69 y.o. male is here for consultation of stage IV breast cancer with known bone metastases. (note I do not have any documentation of biopsy proven metastatic disease)  Patient is accompanied by wife. He was diagnosed with breast cancer in 2011. He never had a mastectomy or radiation, but did have a lumpectomy and is now on Tamoxifen. He also had to "have a nail" put in his right arm. Johnny Lucas said his arm was never biopsied, but an x-ray "revealed a black spot." He had radiation. He does have regular mammograms.  Patient had a PET scan in  June. Johnny Lucas informed him that the scan revealed a benign lesion on his liver and kidney. It also revealed areas in his sternum and hip; however, CT scan appears normal. Patient says he has been experiencing some hip pain, but Johnny Lucas told him it was likely due to aging. He denies pain in sternal area.  Johnny Lucas also reports recent nerve pain. He was pulling 400 pound hay and the next day his leg was numb. He went to see Johnny Lucas and was given a shot of steroids. Johnny Lucas says the steroid alleviated his pain but the numbness is still mildly present.   He also reports leg cramps every morning when he wakes up, but that subsides throughout the day.   Patient's appetite is normal. Energy is fairly good. He takes his tamoxifen daily. He denies any difficulty with the medication.   He is due for denosamab injection and needs a refill on Tamoxifen.  He denies any dental problems.   MEDICAL HISTORY:  Past Medical History:  Diagnosis Date  . Anxiety   . Breast cancer (Johnny Lucas) 2011   Stave IV breast cancer; radiation and tamoxifen  . GERD (gastroesophageal reflux disease)   . Hypertension   . Macular degeneration  SURGICAL HISTORY: Past Surgical History:  Procedure Laterality Date  . BACK SURGERY    . HERNIA REPAIR    . right arm surgery      SOCIAL HISTORY: Social History   Social History  . Marital status: Married    Spouse name: N/A  . Number of children: 3  . Years of education: N/A   Occupational History  . Not on file.   Social History Main Topics  . Smoking status: Never Smoker  . Smokeless tobacco: Former Neurosurgeon  . Alcohol use 14.4 oz/week    24 Cans of beer per week  . Drug use: No  . Sexual activity: Not on file   Other Topics Concern  . Not on file   Social History Narrative  . No narrative on file  Married for 50 years.  He has 3 children and 5 grandchildren.  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 He has never  smoked. He drinks beer.   FAMILY HISTORY: Family History  Problem Relation Age of Onset  . Stroke Mother   . Cancer Maternal Aunt     cancer NOS; died in her 31s  . Lung cancer Maternal Uncle     smoker  Both parents deceased at 39 and 76. Mom died of stroke and dad of old age.   ALLERGIES:  has No Known Allergies.  MEDICATIONS:  Current Outpatient Prescriptions  Medication Sig Dispense Refill  . ALPRAZolam (XANAX) 0.5 MG tablet TAKE 1 TABLET AT BEDTIME AS NEEDED FOR SLEEP    . aspirin EC 81 MG tablet Take by mouth.    . ASTRAGALUS PO Take by mouth.    . Calcium Carbonate-Vitamin D 600-400 MG-UNIT tablet Take by mouth.    . metoprolol (LOPRESSOR) 50 MG tablet Take by mouth.    . Multiple Vitamins-Minerals (OCUVITE PO) Take by mouth.    . pantoprazole (PROTONIX) 40 MG tablet Take by mouth.    . sildenafil (VIAGRA) 25 MG tablet Take by mouth.    . tamoxifen (NOLVADEX) 20 MG tablet TAKE 1 TABLET EVERY DAY    . UNABLE TO FIND Take by mouth.    Marland Kitchen UNABLE TO FIND Take by mouth.    . vitamin B-12 (CYANOCOBALAMIN) 1000 MCG tablet Take by mouth.     No current facility-administered medications for this visit.     Review of Systems  Constitutional: Negative.   HENT: Negative.   Eyes: Negative.   Respiratory: Negative.   Cardiovascular: Negative.   Gastrointestinal: Negative.   Genitourinary: Negative.   Musculoskeletal: Negative.        Leg cramps upon waking Hip pain  Skin: Negative.   Neurological: Positive for tingling.       Nerve pain/numbness  Endo/Heme/Allergies: Negative.   Psychiatric/Behavioral: Negative.   All other systems reviewed and are negative. 14 point ROS was done and is otherwise as detailed above or in HPI   PHYSICAL EXAMINATION: ECOG PERFORMANCE STATUS: 0 - Asymptomatic  Vitals:   09/10/16 1307  BP: (!) 146/84  Pulse: 72  Resp: 16  Temp: 98.9 F (37.2 C)   Filed Weights   09/10/16 1307  Weight: 208 lb 9.6 oz (94.6 kg)     Physical Exam   Constitutional: He is oriented to person, place, and time and well-developed, well-nourished, and in no distress.  HENT:  Head: Normocephalic and atraumatic.  Nose: Nose normal.  Mouth/Throat: Oropharynx is clear and moist. No oropharyngeal exudate.  Eyes: Conjunctivae and EOM are normal. Pupils are equal, round, and reactive to light. Right eye exhibits no discharge. Left eye exhibits no discharge. No scleral icterus.  Neck: Normal range of motion. Neck supple. No tracheal deviation present. No thyromegaly present.  Cardiovascular: Normal rate, regular rhythm and normal heart sounds.  Exam reveals no gallop and no friction rub.   No murmur heard. Pulmonary/Chest: Effort normal and breath sounds normal. He has no wheezes. He has no rales.    Abdominal: Soft. Bowel sounds are normal. He exhibits no distension and no mass. There is no tenderness. There is no rebound and no guarding.  Musculoskeletal: Normal range of motion. He exhibits no edema.  Lymphadenopathy:    He has no cervical adenopathy.  Neurological: He is alert and oriented to person, place, and time. He has normal reflexes. No cranial nerve deficit. Gait normal. Coordination normal.  Skin: Skin is warm and dry. No rash noted.  Psychiatric: Mood, memory, affect and judgment normal.  Nursing note and vitals reviewed.   LABORATORY DATA:  I have reviewed the data as listed Lab Results  Component Value Date   WBC 5.2 01/17/2010   HGB 13.8 01/17/2010   HCT 39.0 01/17/2010   MCV 97.6 01/17/2010   PLT 165 01/17/2010   CMP     Component Value Date/Time   NA 141 01/17/2010 0945   K 4.1 01/17/2010 0945   CL 108 01/17/2010 0945   CO2 27 01/17/2010 0945   GLUCOSE 112 (H) 01/17/2010 0945   BUN 11 01/17/2010 0945   CREATININE 0.97 01/17/2010 0945   CALCIUM 9.0 01/17/2010 0945   PROT 6.3 01/17/2010 0945   ALBUMIN 3.9 01/17/2010 0945   AST 24 01/17/2010 0945   ALT 25 01/17/2010 0945   ALKPHOS 45 01/17/2010 0945   BILITOT  0.6 01/17/2010 0945   GFRNONAA >60 01/17/2010 0945   GFRAA  01/17/2010 0945    >60        The eGFR has been calculated using the MDRD equation. This calculation has not been validated in all clinical situations. eGFR's persistently <60 mL/min signify possible Chronic Kidney Disease.      RADIOGRAPHIC STUDIES: I have personally reviewed the radiological images as listed and agreed with the findings in the report. No results found.  Study Result   CLINICAL DATA:  Subsequent treatment strategy for breast cancer with possible bone metastasis.  EXAM: NUCLEAR MEDICINE PET SKULL BASE TO THIGH  TECHNIQUE: 11.8 mCi F-18 FDG was injected intravenously. Full-ring PET imaging was performed from the skull base to thigh after the radiotracer. CT data was obtained and used for attenuation correction and anatomic localization.  FASTING BLOOD GLUCOSE:  Value: 103 mg/dl  COMPARISON:  CT the chest, abdomen and pelvis 10/22/2015. Bone scan 10/21/2015. Multiple other prior examinations.  FINDINGS: NECK  No hypermetabolic lymph nodes in the neck.  CHEST  No hypermetabolic mediastinal or hilar nodes. No suspicious pulmonary nodules on the CT scan. Heart size is mildly enlarged. There is no significant pericardial fluid, thickening or pericardial calcification. There is atherosclerosis of the thoracic aorta, the great vessels of the mediastinum and the coronary arteries, including calcified atherosclerotic plaque in the left anterior descending coronary arteries. Esophagus is unremarkable in appearance. No axillary lymphadenopathy. Postoperative changes of lumpectomy are noted in the subareolar aspect of the left breast tissue. No acute consolidative airspace disease. No pleural effusions. Dependent subsegmental atelectasis in the lower lobes of the lungs bilaterally.  ABDOMEN/PELVIS  No focal hypermetabolic lesions  in the liver, pancreas, spleen or either adrenal  gland. No enlarged hypermetabolic lymph nodes in the abdomen or pelvis. Several sub cm low-attenuation lesions are noted in the liver, too small to characterize, but similar to prior study 10/23/2015, likely cysts. 3.3 cm low-attenuation lesion in the posterior aspect of the interpolar region of the left kidney, previously characterized as a simple cyst. No significant volume of ascites. No pneumoperitoneum. No pathologic distention of small bowel or colon. Postoperative changes of right inguinal herniorrhaphy are noted.  SKELETON  Focal areas of skeletal hypermetabolism are noted in the right side of the sternum (SUVmax = 5.5), in the superior aspect of the left ilium just lateral to the left sacroiliac joint (SUVmax = 18.5), and in the right ilium just above the right acetabulum (SUVmax = 6.0). At none of these locations is there a clearly identifiable lesion by CT imaging. Orthopedic fixation hardware in the cervical spine. Old healed fracture of the right clavicle with posttraumatic deformity. Intramedullary rod in the right humerus.  IMPRESSION: 1. Three hypermetabolic osseous lesions in the sternum, left ilium and right ilium, as discussed above, likely represent osseous metastases. At this time, these are not recognizable on the CT images. 2. No extra skeletal metastatic disease identified in the neck, chest, abdomen or pelvis. 3. Atherosclerosis, including left anterior descending coronary artery disease. Please note that although the presence of coronary artery calcium documents the presence of coronary artery disease, the severity of this disease and any potential stenosis cannot be assessed on this non-gated CT examination. Assessment for potential risk factor modification, dietary therapy or pharmacologic therapy may be warranted, if clinically indicated. 4. Additional incidental findings, as above.   Electronically Signed   By: Vinnie Langton M.D.   On:  06/09/2016 16:43    ASSESSMENT & PLAN:  Stage IV infiltrating ductal carcinoma L breast ER+ HER 2 - Bone metastases Tamoxifen 20 mg  Denosumab, now Q 3 months  Stage IV infiltrating ductal carcinoma of breast, on tamoxifen and on denosumab now every 3 months. Last PET reviewed, activity noted on PET imaging but not recognized on CT.  He is overall doing well. Excellent appetite and energy. He is compliant with his tamoxifen. He is to continue this daily. This was refilled for him today.  I have recommended repeat PET imaging in several months to rule out the possibility of progressive disease. Note that I do not have docmentation of biopsy proven bone metastases, although at presentation he had a pathologic fracture in the R humerus treated with ORIF and XRT.   He is agreeable with the plan. Cont. With XGEVA.  All questions were answered. The patient knows to call the clinic with any problems, questions or concerns.  ORDERS PLACED FOR THIS ENCOUNTER: Orders Placed This Encounter  Procedures  . NM PET Image Restag (PS) Skull Base To Thigh  . SCHEDULING COMMUNICATION    MEDICATIONS PRESCRIBED THIS ENCOUNTER: Meds ordered this encounter  Medications  . ALPRAZolam (XANAX) 0.5 MG tablet    Sig: TAKE 1 TABLET AT BEDTIME AS NEEDED FOR SLEEP  . aspirin EC 81 MG tablet    Sig: Take by mouth.  . ASTRAGALUS PO    Sig: Take by mouth.  . Calcium Carbonate-Vitamin D 600-400 MG-UNIT tablet    Sig: Take by mouth.  . metoprolol (LOPRESSOR) 50 MG tablet    Sig: Take by mouth.  . Multiple Vitamins-Minerals (OCUVITE PO)    Sig: Take by mouth.  . pantoprazole (PROTONIX) 40  MG tablet    Sig: Take by mouth.  . sildenafil (VIAGRA) 25 MG tablet    Sig: Take by mouth.  . tamoxifen (NOLVADEX) 20 MG tablet    Sig: TAKE 1 TABLET EVERY DAY  . UNABLE TO FIND    Sig: Take by mouth.  Marland Kitchen UNABLE TO FIND    Sig: Take by mouth.  . vitamin B-12 (CYANOCOBALAMIN) 1000 MCG tablet    Sig: Take by mouth.    . denosumab (XGEVA) injection 120 mg    This document serves as a record of services personally performed by Ancil Linsey, MD. It was created on her behalf by Elmyra Ricks, a trained medical scribe. The creation of this record is based on the scribe's personal observations and the provider's statements to them. This document has been checked and approved by the attending provider.  I have reviewed the above documentation for accuracy and completeness, and I agree with the above.  This note was electronically signed.    Molli Hazard, MD  09/10/2016 4:04 PM

## 2016-10-13 ENCOUNTER — Ambulatory Visit (HOSPITAL_COMMUNITY)
Admission: RE | Admit: 2016-10-13 | Discharge: 2016-10-13 | Disposition: A | Discharge status: Home / Self Care | Payer: Medicare Other | Source: Ambulatory Visit | Attending: Hematology & Oncology | Admitting: Hematology & Oncology

## 2016-10-13 DIAGNOSIS — J320 Chronic maxillary sinusitis: Secondary | ICD-10-CM | POA: Insufficient documentation

## 2016-10-13 DIAGNOSIS — C7951 Secondary malignant neoplasm of bone: Secondary | ICD-10-CM

## 2016-10-13 DIAGNOSIS — I517 Cardiomegaly: Secondary | ICD-10-CM | POA: Insufficient documentation

## 2016-10-13 DIAGNOSIS — I2510 Atherosclerotic heart disease of native coronary artery without angina pectoris: Secondary | ICD-10-CM | POA: Diagnosis not present

## 2016-10-13 DIAGNOSIS — I700 Atherosclerosis of aorta: Secondary | ICD-10-CM | POA: Insufficient documentation

## 2016-10-13 DIAGNOSIS — C50122 Malignant neoplasm of central portion of left male breast: Secondary | ICD-10-CM | POA: Diagnosis not present

## 2016-10-13 DIAGNOSIS — C419 Malignant neoplasm of bone and articular cartilage, unspecified: Secondary | ICD-10-CM | POA: Diagnosis not present

## 2016-10-13 LAB — GLUCOSE, CAPILLARY: GLUCOSE-CAPILLARY: 101 mg/dL — AB (ref 65–99)

## 2016-10-13 MED ORDER — FLUDEOXYGLUCOSE F - 18 (FDG) INJECTION
10.5200 | Freq: Once | INTRAVENOUS | Status: AC | PRN
  Administered 2016-10-13: 10.52 via INTRAVENOUS

## 2016-10-14 ENCOUNTER — Encounter (HOSPITAL_COMMUNITY): Payer: Medicare Other | Attending: Hematology & Oncology | Admitting: Hematology & Oncology

## 2016-10-14 ENCOUNTER — Encounter (HOSPITAL_COMMUNITY): Payer: Self-pay | Admitting: Hematology & Oncology

## 2016-10-14 VITALS — BP 142/74 | HR 62 | Temp 98.3°F | Resp 18 | Wt 211.8 lb

## 2016-10-14 DIAGNOSIS — Z79810 Long term (current) use of selective estrogen receptor modulators (SERMs): Secondary | ICD-10-CM

## 2016-10-14 DIAGNOSIS — M25552 Pain in left hip: Secondary | ICD-10-CM

## 2016-10-14 DIAGNOSIS — C50122 Malignant neoplasm of central portion of left male breast: Secondary | ICD-10-CM | POA: Diagnosis not present

## 2016-10-14 DIAGNOSIS — Z79899 Other long term (current) drug therapy: Secondary | ICD-10-CM

## 2016-10-14 DIAGNOSIS — C7951 Secondary malignant neoplasm of bone: Secondary | ICD-10-CM | POA: Diagnosis not present

## 2016-10-14 DIAGNOSIS — Z170 Estrogen receptor positive status [ER+]: Secondary | ICD-10-CM | POA: Insufficient documentation

## 2016-10-14 DIAGNOSIS — Z5181 Encounter for therapeutic drug level monitoring: Secondary | ICD-10-CM

## 2016-10-14 NOTE — Patient Instructions (Signed)
[place] at [facility] Discharge Instructions  RECOMMENDATIONS MADE BY THE CONSULTANT AND ANY TEST RESULTS WILL BE SENT TO YOUR REFERRING PHYSICIAN.  Return to clinic for follow up after PET scan.   Please see Amy as you leave today for PET and follow up appointment dates and times.    Thank you for choosing [place] at [facility] to provide your oncology and hematology care.  To afford each patient quality time with our provider, please arrive at least 15 minutes before your scheduled appointment time.   Beginning January 23rd 2017 lab work for the [facility] will be done in the  Main lab at [facility] on 1st floor. If you have a lab appointment with the [facility] please come in thru the  Main Entrance and check in at the main information desk  You need to re-schedule your appointment should you arrive 10 or more minutes late.  We strive to give you quality time with our providers, and arriving late affects you and other patients whose appointments are after yours.  Also, if you no show three or more times for appointments you may be dismissed from the clinic at the providers discretion.     Again, thank you for choosing [facility]  Our hope is that these requests will decrease the amount of time that you wait before being seen by our physicians.       _____________________________________________________________  Should you have questions after your visit to [facility], please contact our office at [phone] between the hours of 8:30 a.m. and 4:30 p.m.  Voicemails left after 4:30 p.m. will not be returned until the following business day.  For prescription refill requests, have your pharmacy contact our office.         Resources For Cancer Patients and their Caregivers ? American Cancer Society: Can assist with transportation, wigs, general needs, runs Look Good Feel Better.         (719)316-1133 ? Cancer Care: Provides financial assistance, online support groups, medication/co-pay assistance.  1-800-813-HOPE (250) 586-4522) ? [facility] Assists [place] cancer patients and their families through emotional , educational and financial support.  506-004-3476 ? [place] DSS Where to apply for food stamps, Medicaid and utility assistance. 218-110-8717 ? [facility]: Transportation to medical appointments. 9897332514 ? Social Security Administration: May apply for disability if have a Stage IV cancer. 713-630-5660 410-168-2339 ? [employer], Disability and Transit Services: Assists with nutrition, care and transit needs. [place] Support Programs: @10RELATIVEDAYS @ > Cancer Support Group  2nd Tuesday of the month 1pm-2pm, Journey Room  > Creative Journey  3rd Tuesday of the month 1130am-1pm, Journey Room  > Look Good Feel Better  1st Wednesday of the month 10am-12 noon, Journey Room (Call Amanda to register [phone])

## 2016-10-14 NOTE — Progress Notes (Signed)
Ama NOTE  Patient Care Team: [name], MD as PCP - General (Family Medicine)  CHIEF COMPLAINTS/PURPOSE OF CONSULTATION:    Breast cancer, male ([place])   12/31/2009 Initial Biopsy    Biopsy of L breast       12/31/2009 Pathology Results    Invasive ductal carcinoma, ER/PR+, HER 2 negative      12/31/2009 Imaging    Ultrasound showing a 2.43 x 1.85 x 3 cm hypoechoic spiculated mass in the 12 o clock L breast retroareolar region      01/01/2010 -  Anti-estrogen oral therapy    Tamoxifen 20 mg daily      01/06/2010 Imaging    Bone scan abnormal uptake in the diaphysis of the R humerus, abnormal in the R third, fifth and sixth ribs, lesion also noted in the sternum.      02/03/2010 Surgery    Rod placement and fixation of R humerus by Dr. [name]      02/05/2010 - 02/18/2010 Radiation Therapy    30Gy in 10 fractions of 3 Gy per fraction to R pathologic fracture      03/11/2010 -  Chemotherapy    Denosumab monthly, now every 3 months. Started at [facility]       06/09/2016 Imaging    Three hypermetabolic osseous lesions in the sternum, left ilium and right ilium, as discussed above, likely represent osseous metastases. At this time, these are not recognizable on the CT images. 2. No extra skeletal metastatic disease identified in the neck, chest, abdomen or pelvis.      10/13/2016 Progression    PET shows various new and enlarging osseous metastatic lesions with no definite extra osseous metastatic disease currently identified.        HISTORY OF PRESENTING ILLNESS:  Johnny Lucas 70 y.o. male is here for ongoing follow-up of stage IV breast cancer with known bone metastases. (note I do not have any documentation of biopsy proven metastatic disease)  Patient is accompanied by wife. He was diagnosed with breast cancer in 2011. He never had a mastectomy or radiation, but did have a lumpectomy and is now on Tamoxifen. He also had to "have a nail" put  in his right arm. Arleigh said his arm was never biopsied, but an x-ray "revealed a black spot." He had radiation. He does have regular mammograms.  I personally reviewed and went over PET results with the patient and his wife. We reviewed his progression of disease through Tamoxifen. I spoke with him about switching to Xeloda.  He remembers there were spots on his ribs on previous scans. He also speaks of being in an accident where several of his bones were injured. The patient states he is not 100% totally convinced he has bone cancer. "If it ain't broke, don't fix it". His bones have never been biopsied.  He likes the idea of continuing on Tamoxifen and having a repeat PET scan. He is open to a bone biopsy in the future, just not now.   He complains of pain in his right hip. "It's not a big deal pain". Describes it as "An old age pain". This pain does not wake him at night. Later in our visit, he reports having back pain as well.   His wife remarks he mentioned he pulled a muscle yesterday in the shower. He reports this is better today. He went walking today.  His appetite has been the same.  The patient brought his  genetic testing results. I have made a photocopy of this to put in his chart.  He has already received a flu shot this season.   MEDICAL HISTORY:  Past Medical History:  Diagnosis Date  . Anxiety   . Breast cancer ([place]) 2011   Stave IV breast cancer; radiation and tamoxifen  . GERD (gastroesophageal reflux disease)   . Hypertension   . Macular degeneration     SURGICAL HISTORY: Past Surgical History:  Procedure Laterality Date  . BACK SURGERY    . HERNIA REPAIR    . right arm surgery      SOCIAL HISTORY: Social History   Social History  . Marital status: Married    Spouse name: N/A  . Number of children: 3  . Years of education: N/A   Occupational History  . Not on file.   Social History Main Topics  . Smoking status: Never Smoker  . Smokeless tobacco:  Former Systems developer  . Alcohol use 14.4 oz/week    24 Cans of beer per week  . Drug use: No  . Sexual activity: Not on file   Other Topics Concern  . Not on file   Social History Narrative  . No narrative on file  Married for 50 years.  He has 3 children and 5 grandchildren.  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 He has never smoked. He drinks beer.   FAMILY HISTORY: Family History  Problem Relation Age of Onset  . Stroke Mother   . Cancer Maternal Aunt     cancer NOS; died in her 17s  . Lung cancer Maternal Uncle     smoker  Both parents deceased at 21 and 69. Mom died of stroke and dad of old age.   ALLERGIES:  has No Known Allergies.  MEDICATIONS:  Current Outpatient Prescriptions  Medication Sig Dispense Refill  . aspirin EC 81 MG tablet Take by mouth.    . ASTRAGALUS PO Take by mouth.    . Calcium Carbonate-Vitamin D 600-400 MG-UNIT tablet Take by mouth.    . metoprolol (LOPRESSOR) 50 MG tablet Take by mouth.    . Multiple Vitamin (THERA) TABS Take by mouth.    . Multiple Vitamins-Minerals (OCUVITE PO) Take by mouth.    . pantoprazole (PROTONIX) 40 MG tablet Take by mouth.    . sildenafil (VIAGRA) 25 MG tablet Take by mouth.    . tamoxifen (NOLVADEX) 20 MG tablet TAKE 1 TABLET EVERY DAY    . UNABLE TO FIND Take by mouth.    Marland Kitchen UNABLE TO FIND Take by mouth.    . vitamin B-12 (CYANOCOBALAMIN) 1000 MCG tablet Take by mouth.    . ALPRAZolam (XANAX) 0.5 MG tablet Take 1 tablet (0.5 mg total) by mouth at bedtime as needed. for sleep 30 tablet 2   No current facility-administered medications for this visit.     Review of Systems  Constitutional: Negative.   HENT: Negative.   Eyes: Negative.   Respiratory: Negative.   Cardiovascular: Negative.   Gastrointestinal: Negative.   Genitourinary: Negative.   Musculoskeletal: Positive for back pain and joint pain.       Right hip pain  Skin: Negative.   Neurological: Positive for  tingling.       Nerve pain/numbness  Endo/Heme/Allergies: Negative.   Psychiatric/Behavioral: Negative.   All other systems reviewed and are negative. 14 point ROS was done and is otherwise as detailed above or in  HPI   PHYSICAL EXAMINATION: ECOG PERFORMANCE STATUS: 0 - Asymptomatic  Vitals:   10/14/16 1350  BP: (!) 142/74  Pulse: 62  Resp: 18  Temp: 98.3 F (36.8 C)   Filed Weights   10/14/16 1350  Weight: 211 lb 12.8 oz (96.1 kg)     Physical Exam  Constitutional: He is oriented to person, place, and time and well-developed, well-nourished, and in no distress.  HENT:  Head: Normocephalic and atraumatic.  Nose: Nose normal.  Mouth/Throat: Oropharynx is clear and moist. No oropharyngeal exudate.  Eyes: Conjunctivae and EOM are normal. Pupils are equal, round, and reactive to light. Right eye exhibits no discharge. Left eye exhibits no discharge. No scleral icterus.  Neck: Normal range of motion. Neck supple. No tracheal deviation present. No thyromegaly present.  Cardiovascular: Normal rate, regular rhythm and normal heart sounds.  Exam reveals no gallop and no friction rub.   No murmur heard. Pulmonary/Chest: Effort normal and breath sounds normal. He has no wheezes. He has no rales.    Abdominal: Soft. Bowel sounds are normal. He exhibits no distension and no mass. There is no tenderness. There is no rebound and no guarding.  Musculoskeletal: Normal range of motion. He exhibits no edema.  Lymphadenopathy:    He has no cervical adenopathy.  Neurological: He is alert and oriented to person, place, and time. He has normal reflexes. No cranial nerve deficit. Gait normal. Coordination normal.  Skin: Skin is warm and dry. No rash noted.  Psychiatric: Mood, memory, affect and judgment normal.  Nursing note and vitals reviewed.   LABORATORY DATA:  I have reviewed the data as listed Lab Results  Component Value Date   WBC 5.2 01/17/2010   HGB 13.8 01/17/2010   HCT 39.0  01/17/2010   MCV 97.6 01/17/2010   PLT 165 01/17/2010   CMP     Component Value Date/Time   NA 141 01/17/2010 0945   K 4.1 01/17/2010 0945   CL 108 01/17/2010 0945   CO2 27 01/17/2010 0945   GLUCOSE 112 (H) 01/17/2010 0945   BUN 11 01/17/2010 0945   CREATININE 0.97 01/17/2010 0945   CALCIUM 9.0 01/17/2010 0945   PROT 6.3 01/17/2010 0945   ALBUMIN 3.9 01/17/2010 0945   AST 24 01/17/2010 0945   ALT 25 01/17/2010 0945   ALKPHOS 45 01/17/2010 0945   BILITOT 0.6 01/17/2010 0945   GFRNONAA >60 01/17/2010 0945   GFRAA  01/17/2010 0945    >60        The eGFR has been calculated using the MDRD equation. This calculation has not been validated in all clinical situations. eGFR's persistently <60 mL/min signify possible Chronic Kidney Disease.      RADIOGRAPHIC STUDIES: I have personally reviewed the radiological images as listed and agreed with the findings in the report. No results found. Study Result   CLINICAL DATA:  Subsequent treatment strategy for male breast cancer with osseous metastatic disease.  EXAM: NUCLEAR MEDICINE PET SKULL BASE TO THIGH  TECHNIQUE: 10.5 mCi F-18 FDG was injected intravenously. Full-ring PET imaging was performed from the skull base to thigh after the radiotracer. CT data was obtained and used for attenuation correction and anatomic localization.  Fasting blood sugar:  101 mg per dL  COMPARISON:  06/09/2016  FINDINGS: NECK  Chronic right maxillary sinusitis. Postoperative findings in the right humerus and lower cervical spine.  No hypermetabolic adenopathy in the neck.  CHEST  No hypermetabolic mediastinal or hilar nodes. No suspicious pulmonary nodules  on the CT data. Left anterior descending coronary artery atherosclerosis with mild cardiomegaly.  ABDOMEN/PELVIS  No abnormal hypermetabolic activity within the liver, pancreas, adrenal glands, or spleen. No hypermetabolic lymph nodes in the abdomen or pelvis.  Small hypodense lesions in the liver, spleen, and left kidney do not appear hypermetabolic, and are likely benign.  Aortoiliac atherosclerotic vascular disease.  SKELETON  Small new focus of hypermetabolic activity in the right acromion, maximum SUV 8.7.  Right medial sixth rib lesion appears to be new and has a maximum standard uptake value of 7.2. I do not see a well-defined corresponding CT abnormality.  Heterogeneous sternal manubrium with generally low metabolic activity, but there is higher activity in the sternal body with maximum SUV of 13.9 focally in the right lower sternal body, previously 5.5.  In the right T3 vertebral body posteriorly there is a small new hypermetabolic focus with maximum SUV 6.8.  In the left posterior ninth rib there is a small new hypermetabolic focus with maximum SUV 7.2.  Posteriorly in the L3 vertebral body there is a new small hypermetabolic focus with maximum SUV 7.2.  In the left iliac bone, there is an enlarging metastatic focus with hypermetabolic activity at maximum SUV of 34.3, formerly 18.5.  In the right upper acetabulum, there is an enlarging hypermetabolic focus with maximum SUV 22.8, formerly 6.0.  Faint hypermetabolic focus in the anterior inferior wall of the right acetabulum, maximum SUV 5.6.  Old fractures the right clavicle and multiple right ribs. Some of the sclerotic lesions associated with metastatic disease, including the sternal manubrium lesion in the posterior right iliac crest lesion, are not overtly hypermetabolic on today's exam.  IMPRESSION: 1. Various new and enlarging osseous metastatic lesions as detailed above. No definite extra osseous metastatic disease is currently identified. 2. Chronic right maxillary sinusitis. 3. Coronary atherosclerosis with mild cardiomegaly. 4. Several suspected cysts in the liver, spleen, and left kidney. 5.  Aortoiliac atherosclerotic vascular  disease.   Electronically Signed   By: Van Clines M.D.   On: 10/13/2016 15:03     ASSESSMENT & PLAN:  Stage IV infiltrating ductal carcinoma L breast ER+ HER 2 - Bone metastases Tamoxifen 20 mg  Denosumab, now Q 3 months  Stage IV infiltrating ductal carcinoma of breast, on tamoxifen and on denosumab now every 3 months. .  10/13/2016 PET reviewed. There were various new and enlarging osseous metastatic lesions seen with no definite extra osseous metastatic disease identified.   The patient was given copies of PET scans from yesterday and June.   We reviewed his progression of disease through Tamoxifen. I spoke with him about switching to Xeloda. He has never had a bone biopsy. He is simply not open to the idea that he may have progressive disease. He is not interested in biopsy, he is not interested in changing therapy.  The patient will continue on Tamoxifen for now.  I contact Dr. Laurence Ferrari to discuss whether any of these bone metastases are able to be biopsied.   We will repeat PET after Christmas. If this shows further progression, we will discuss future treatment plans. He will contact our clinic if his hip pain worsens or if he develops any other issues.   The patient brought his genetic testing results. I have made a photocopy of this to put in his chart.  He does not need any refills at this time. He has already received a flu shot this season.   He will return for follow up  post PET to discuss these results.   All questions were answered. The patient knows to call the clinic with any problems, questions or concerns.  ORDERS PLACED FOR THIS ENCOUNTER: Orders Placed This Encounter  Procedures  . NM PET Image Restag (PS) Skull Base To Thigh    MEDICATIONS PRESCRIBED THIS ENCOUNTER: Meds ordered this encounter  Medications  . Multiple Vitamin (THERA) TABS    Sig: Take by mouth.  . Calcium Carbonate-Vitamin D 600-400 MG-UNIT tablet    Sig: Take by mouth.     This document serves as a record of services personally performed by Ancil Linsey, MD. It was created on her behalf by Arlyce Harman, a trained medical scribe. The creation of this record is based on the scribe's personal observations and the provider's statements to them. This document has been checked and approved by the attending provider.  I have reviewed the above documentation for accuracy and completeness, and I agree with the above.  This note was electronically signed.    Molli Hazard, MD  11/07/2016 6:34 PM

## 2016-10-28 ENCOUNTER — Other Ambulatory Visit (HOSPITAL_COMMUNITY): Payer: Self-pay | Admitting: Oncology

## 2016-10-28 DIAGNOSIS — F419 Anxiety disorder, unspecified: Secondary | ICD-10-CM

## 2016-10-28 MED ORDER — ALPRAZOLAM 0.5 MG PO TABS: 0.5000 mg | ORAL_TABLET | Freq: Every evening | ORAL | 2 refills | Status: DC | PRN

## 2016-11-07 ENCOUNTER — Encounter (HOSPITAL_COMMUNITY): Payer: Self-pay | Admitting: Hematology & Oncology

## 2016-12-10 ENCOUNTER — Encounter (HOSPITAL_COMMUNITY): Payer: Medicare Other

## 2016-12-10 ENCOUNTER — Encounter (HOSPITAL_COMMUNITY): Payer: Medicare Other | Attending: Hematology & Oncology

## 2016-12-10 VITALS — BP 157/75 | HR 60 | Temp 98.4°F | Resp 18

## 2016-12-10 DIAGNOSIS — C50122 Malignant neoplasm of central portion of left male breast: Secondary | ICD-10-CM | POA: Diagnosis not present

## 2016-12-10 DIAGNOSIS — C7951 Secondary malignant neoplasm of bone: Secondary | ICD-10-CM | POA: Diagnosis not present

## 2016-12-10 DIAGNOSIS — Z170 Estrogen receptor positive status [ER+]: Secondary | ICD-10-CM | POA: Insufficient documentation

## 2016-12-10 LAB — CBC WITH DIFFERENTIAL/PLATELET
BASOS ABS: 0 10*3/uL (ref 0.0–0.1)
Basophils Relative: 1 %
Eosinophils Absolute: 0.2 10*3/uL (ref 0.0–0.7)
Eosinophils Relative: 4 %
HEMATOCRIT: 34.2 % — AB (ref 39.0–52.0)
Hemoglobin: 11.4 g/dL — ABNORMAL LOW (ref 13.0–17.0)
LYMPHS ABS: 2.1 10*3/uL (ref 0.7–4.0)
LYMPHS PCT: 37 %
MCH: 32.8 pg (ref 26.0–34.0)
MCHC: 33.3 g/dL (ref 30.0–36.0)
MCV: 98.3 fL (ref 78.0–100.0)
MONO ABS: 0.5 10*3/uL (ref 0.1–1.0)
MONOS PCT: 8 %
NEUTROS ABS: 2.9 10*3/uL (ref 1.7–7.7)
Neutrophils Relative %: 50 %
Platelets: 151 10*3/uL (ref 150–400)
RBC: 3.48 MIL/uL — ABNORMAL LOW (ref 4.22–5.81)
RDW: 13.1 % (ref 11.5–15.5)
WBC: 5.7 10*3/uL (ref 4.0–10.5)

## 2016-12-10 LAB — COMPREHENSIVE METABOLIC PANEL
ALBUMIN: 3.6 g/dL (ref 3.5–5.0)
ALT: 20 U/L (ref 17–63)
ANION GAP: 4 — AB (ref 5–15)
AST: 27 U/L (ref 15–41)
Alkaline Phosphatase: 25 U/L — ABNORMAL LOW (ref 38–126)
BUN: 13 mg/dL (ref 6–20)
CHLORIDE: 106 mmol/L (ref 101–111)
CO2: 25 mmol/L (ref 22–32)
Calcium: 9 mg/dL (ref 8.9–10.3)
Creatinine, Ser: 1.16 mg/dL (ref 0.61–1.24)
GFR calc Af Amer: >60 mL/min (ref >60)
GFR calc non Af Amer: >60 mL/min (ref >60)
GLUCOSE: 91 mg/dL (ref 65–99)
POTASSIUM: 4.3 mmol/L (ref 3.5–5.1)
SODIUM: 135 mmol/L (ref 135–145)
TOTAL PROTEIN: 7.7 g/dL (ref 6.5–8.1)
Total Bilirubin: 0.6 mg/dL (ref 0.3–1.2)

## 2016-12-10 MED ORDER — DENOSUMAB 120 MG/1.7ML ~~LOC~~ SOLN
120.0000 mg | Freq: Once | SUBCUTANEOUS | Status: AC
  Administered 2016-12-10: 120 mg via SUBCUTANEOUS
  Filled 2016-12-10: qty 1.7

## 2016-12-10 NOTE — Patient Instructions (Signed)
[facility] Discharge Instructions  RECOMMENDATIONS MADE BY THE CONSULTANT AND ANY TEST RESULTS WILL BE SENT TO YOUR REFERRING PHYSICIAN.  Received Xgeva injection today. Follow-up as scheduled. Call clinic for any questions or concerns  Thank you for choosing  Cancer Center at [facility] to provide your oncology and hematology care.  To afford each patient quality time with our provider, please arrive at least 15 minutes before your scheduled appointment time.   Beginning January 23rd 2017 lab work for the Cancer Center will be done in the  Main lab at [facility] on 1st floor. If you have a lab appointment with the Cancer Center please come in thru the  Main Entrance and check in at the main information desk  You need to re-schedule your appointment should you arrive 10 or more minutes late.  We strive to give you quality time with our providers, and arriving late affects you and other patients whose appointments are after yours.  Also, if you no show three or more times for appointments you may be dismissed from the clinic at the providers discretion.     Again, thank you for choosing [facility].  Our hope is that these requests will decrease the amount of time that you wait before being seen by our physicians.       _____________________________________________________________  Should you have questions after your visit to [facility], please contact our office at [phone] between the hours of 8:30 a.m. and 4:30 p.m.  Voicemails left after 4:30 p.m. will not be returned until the following business day.  For prescription refill requests, have your pharmacy contact our office.         Resources For Cancer Patients and their Caregivers ? American Cancer Society: Can assist with transportation, wigs, general needs, runs Look Good Feel Better.        1-888-227-6333 ? Cancer Care: Provides financial  assistance, online support groups, medication/co-pay assistance.  1-800-813-HOPE (4673) ? [facility] Assists [place] cancer patients and their families through emotional , educational and financial support.  336-427-4357 ? [place] DSS Where to apply for food stamps, Medicaid and utility assistance. 336-342-1394 ? [facility]: Transportation to medical appointments. 336-347-2287 ? Social Security Administration: May apply for disability if have a Stage IV cancer. 336-342-7796 1-800-772-1213 ? [place] Aging, Disability and Transit Services: Assists with nutrition, care and transit needs. 336-349-2343  Cancer Center Support Programs: @10RELATIVEDAYS@ > Cancer Support Group  2nd Tuesday of the month 1pm-2pm, Journey Room  > Creative Journey  3rd Tuesday of the month 1130am-1pm, Journey Room  > Look Good Feel Better  1st Wednesday of the month 10am-12 noon, Journey Room (Call American Cancer Society to register 1-800-395-5775)   

## 2016-12-10 NOTE — Progress Notes (Signed)
Johnny Lucas tolerated Xgeva well without complaints or incident. Labs reviewed with Calcium WNL and pt denied any tooth,jaw or leg pain prior to administering Xgeva. VSS Pt discharged self ambulatory in satisfactory condition accompanied by his wife

## 2016-12-31 ENCOUNTER — Ambulatory Visit (HOSPITAL_COMMUNITY)
Admission: RE | Admit: 2016-12-31 | Discharge: 2016-12-31 | Disposition: A | Discharge status: Home / Self Care | Payer: Medicare Other | Source: Ambulatory Visit | Attending: Hematology & Oncology | Admitting: Hematology & Oncology

## 2016-12-31 DIAGNOSIS — C7951 Secondary malignant neoplasm of bone: Secondary | ICD-10-CM | POA: Diagnosis not present

## 2016-12-31 DIAGNOSIS — I700 Atherosclerosis of aorta: Secondary | ICD-10-CM | POA: Insufficient documentation

## 2016-12-31 DIAGNOSIS — J329 Chronic sinusitis, unspecified: Secondary | ICD-10-CM | POA: Diagnosis not present

## 2016-12-31 DIAGNOSIS — I2510 Atherosclerotic heart disease of native coronary artery without angina pectoris: Secondary | ICD-10-CM | POA: Insufficient documentation

## 2016-12-31 DIAGNOSIS — C50922 Malignant neoplasm of unspecified site of left male breast: Secondary | ICD-10-CM | POA: Diagnosis not present

## 2016-12-31 DIAGNOSIS — C50122 Malignant neoplasm of central portion of left male breast: Secondary | ICD-10-CM | POA: Diagnosis not present

## 2016-12-31 DIAGNOSIS — Z170 Estrogen receptor positive status [ER+]: Secondary | ICD-10-CM | POA: Diagnosis not present

## 2016-12-31 LAB — GLUCOSE, CAPILLARY: GLUCOSE-CAPILLARY: 99 mg/dL (ref 65–99)

## 2016-12-31 MED ORDER — FLUDEOXYGLUCOSE F - 18 (FDG) INJECTION
10.5800 | Freq: Once | INTRAVENOUS | Status: AC | PRN
  Administered 2016-12-31: 10.58 via INTRAVENOUS

## 2017-01-04 ENCOUNTER — Encounter (HOSPITAL_COMMUNITY): Payer: Medicare Other | Attending: Hematology & Oncology | Admitting: Hematology & Oncology

## 2017-01-04 ENCOUNTER — Encounter (HOSPITAL_COMMUNITY): Payer: Self-pay | Admitting: Hematology & Oncology

## 2017-01-04 VITALS — BP 158/80 | HR 66 | Temp 98.6°F | Resp 18 | Wt 214.8 lb

## 2017-01-04 DIAGNOSIS — C7951 Secondary malignant neoplasm of bone: Secondary | ICD-10-CM | POA: Diagnosis not present

## 2017-01-04 DIAGNOSIS — C50122 Malignant neoplasm of central portion of left male breast: Secondary | ICD-10-CM | POA: Diagnosis not present

## 2017-01-04 DIAGNOSIS — Z170 Estrogen receptor positive status [ER+]: Secondary | ICD-10-CM | POA: Insufficient documentation

## 2017-01-04 DIAGNOSIS — Z5181 Encounter for therapeutic drug level monitoring: Secondary | ICD-10-CM

## 2017-01-04 DIAGNOSIS — Z79810 Long term (current) use of selective estrogen receptor modulators (SERMs): Secondary | ICD-10-CM

## 2017-01-04 MED ORDER — CALCIUM CARB-CHOLECALCIFEROL 500-600 MG-UNIT PO TABS: 2.0000 | ORAL_TABLET | Freq: Every day | ORAL | 6 refills | Status: DC

## 2017-01-04 NOTE — Patient Instructions (Addendum)
[place]   CHEMOTHERAPY INSTRUCTIONS  You have Stage 4 breast cancer.  We are switching your treatment to Xeloda.  You will take this for 7 days on and 7 days off.  This treatment is with palliative intent, which means you are treatable but not curable.  You will see the doctor regularly throughout treatment.  We monitor your lab work prior to every treatment.  The doctor monitors your response to treatment by the way you are feeling, your blood work, and scans periodically.   POTENTIAL SIDE EFFECTS OF TREATMENT:  Xeloda (capecitabine) dose is  2150 mg twice a day.  You will take this for 7 days on and 7 days off.  Call me Anderson [place] Nurse Navigator) at [phone], if you start to get any of the side effects from taking this medication.  Take Xeloda with water 30 minutes after breakfast and 30 minutes after your evening meal.  No pregnant, child bearing age people, or animals should come into contact with this drug. Even though this is in pill form - it is still very powerful!!! If you should be instructed to discontinue taking this drug, bring the drug into the [facility] and we will dispose of it in a safe manner. Chemotherapy is a biohazard and must be disposed of properly. Do not touch this pill much. It would be best if the caregiver wore gloves while handling. Do not put this pill in with the rest of your pills in a pill box. Keep them in a separate pill box.  Precautions: . Before starting capecitabine treatment, make sure you tell your doctor about any other medications you are taking (including prescription, over-the-counter, vitamins, herbal remedies, etc.). Do not take aspirin, products containing aspirin unless your doctor specifically permits this.  . Avoid use of antacids within 2 hours of taking capecitabine.  . If you are on warfarin (Coumadin) as a blood-thinner, adjustments may need to be made to your dose based on blood work.   . Capecitabine may be inadvisable if you have had a hypersensitivity (allergic) reaction to fluorouracil.  . Do not receive any kind of immunization or vaccination without your doctor's approval while taking capecitabine.  . Inform your health care professional if you are pregnant or may be pregnant prior to starting this treatment. Pregnancy category D (capecitabine may be hazardous to the fetus. Women who are pregnant or become pregnant must be advised of the potential hazard to the fetus).  . For both men and women: Do not conceive a child (get pregnant) while taking capecitabine. Barrier methods of contraception, such as condoms, are recommended. Discuss with your doctor when you may safely become pregnant or conceive a child after therapy.  . Do not breast feed while taking capecitabine.   POTENTIAL SIDE EFFECTS OF TREATMENT:  Xeloda - diarrhea, hand-foot syndrome (hands/feet can get red/tender/and skin can peel). Avoid friction and hot environments on hands/feet. Wear cotton socks. Lotion twice a day to hands/fingers/feet/toes with Udder cream. Mucositis (inflammation of any mucosal membrane can develop -this can occur in the throat/mouth). Mouth sores, nausea/vomiting, anemia, fatigue can also develop.    The following side effects are common (occurring in greater than 30%) for patients taking capecitabine: . Low white blood cell count (This can put you at increased risk for infection)  . Low red blood cell count (anemia)  . Hand-foot syndrome (Palmar-plantar erythrodysesthesia or PPE) - skin rash, swelling, redness, pain and/or peeling of the skin on the  palms of hands and soles of feet. Usually mild, has started as early as 2 weeks after start of treatment. May require reductions in the dose of the medication)  . Diarrhea  . Elevated liver enzymes (increased bilirubin levels) (see liver problems)  . Fatigue  . Nausea and vomiting  . Rash & itching  . Abdominal pain These side effects  are less common side effects (occurring in about 10-29%) of patients receiving capecitabine: . Poor appetite  . Low platelet count (This can put you at increased risk for bleeding)  . Mouth sores  . Fever  . Swelling of the feet and ankles  . Eye irritation  . Constipation  . Back, muscle, joint, bone pane (see pain)  . Headache  . GI Motility disorder  . Numbness or tingling (hands or feet) Not all side effects are listed above. Some that are rare (occurring in less than 10% of patients) are not listed here. However, you should always inform your health care provider if you experience any unusual symptoms.   When To Contact Your Doctor or Health Care Provider: Contact your health care provider immediately, day or night, if you should experience any of the following symptoms: . Fever of 100.5 F (38 C) or higher, chills (possible signs of infection) The following symptoms require medical attention, but are not an emergency. Contact your health care provider within 24 hours of noticing any of the following: . Nausea (interferes with ability to eat and unrelieved with prescribed medication).  . Vomiting (vomiting more than 4-5 times in a 24 hour period)  . Diarrhea (4-6 episodes in a 24-hour period)  . Unusual bleeding or bruising  . Black or tarry stools, or blood in your stools or urine  . Constipation  . Extreme fatigue (unable to carry on self-care activities)  . Mouth sores (painful redness, swelling or ulcers)  . Swelling, redness and/or pain in one leg or arm and not the other  . Yellowing of the skin or eyes  . Tingling or burning, redness, swelling of the palms of the hands or soles of the feet.  . Confusion, loss of balance, excessive sleepiness Always inform your health care provider if you experience any unusual symptoms.   Self-Care Tips: . Drink at least two to three quarts of fluid every 24 hours, unless you are instructed otherwise.  . You may be at risk of  infection so try to avoid crowds or people with colds, and report fever or any other signs of infection immediately to you health care provider.  Wendee Copp your hands often.  . To help treat/prevent mouth sores, use a soft toothbrush, and rinse three times a day with 1/2 to 1 teaspoon of baking soda and/or 1/2 to 1 teaspoon of salt mixed with 8 ounces of water.  . Use an electric razor and a soft toothbrush to minimize bleeding.  . Avoid contact sports or activities that could cause injury.  . To reduce nausea, take anti-nausea medications as prescribed by your doctor, and eat small, frequent meals.  . Prevention of hand-foot syndrome. Modification of normal activities of daily living to reduce friction and heat exposure to hands and feet, as much as possible during treatment with capecitabine. (for more information see - Managing side effects: hand foot syndrome).  Marland Kitchen Keeps palms of hands and soles of feet moist using emollients such as Aveeno, Udder cream, Lubriderm or Bag Balm.  . Follow regimen of anti-diarrhea medication as prescribed by your health  care professional.  . Eat foods that may help reduce diarrhea (see managing side effects - diarrhea).  . Avoid sun exposure. Wear SPF 42 (or higher) sunblock and protective clothing.  . You may experience drowsiness or dizziness; avoid driving or engaging in tasks that require alertness until your response to the drug is known.  . In general, drinking alcoholic beverages should be kept to a minimum or avoided completely. You should discuss this with your doctor.  . Get plenty of rest.  . Maintain good nutrition.  . If you experience symptoms or side effects, be sure to discuss them with your health care team. They can prescribe medications and/or offer other suggestions that are effective in managing such problems.       EDUCATIONAL MATERIALS GIVEN AND REVIEWED: Chemotherapy and you book.  Nutrition book given.  Information on Xeloda.      SELF CARE ACTIVITIES WHILE ON CHEMOTHERAPY: Hydration Increase your fluid intake 48 hours prior to treatment and drink at least 8 to 12 cups (64 ounces) of water/decaff beverages per day after treatment. You can still have your cup of coffee or soda but these beverages do not count as part of your 8 to 12 cups that you need to drink daily. No alcohol intake.  Medications Continue taking your normal prescription medication as prescribed.  If you start any new herbal or new supplements please let us know first to make sure it is safe.  Mouth Care Have teeth cleaned professionally before starting treatment. Keep dentures and partial plates clean. Use soft toothbrush and do not use mouthwashes that contain alcohol. Biotene is a good mouthwash that is available at most pharmacies or may be ordered by calling [phone]. Use warm salt water gargles (1 teaspoon salt per 1 quart warm water) before and after meals and at bedtime. Or you may rinse with 2 tablespoons of three-percent hydrogen peroxide mixed in eight ounces of water. If you are still having problems with your mouth or sores in your mouth please call the clinic. If you need dental work, please let Dr. Whitney Muse know before you go for your appointment so that we can coordinate the best possible time for you in regards to your chemo regimen. You need to also let your dentist know that you are actively taking chemo. We may need to do labs prior to your dental appointment.   Skin Care Always use sunscreen that has not expired and with SPF (Sun Protection Factor) of 50 or higher. Wear hats to protect your head from the sun. Remember to use sunscreen on your hands, ears, face, & feet.  Use good moisturizing lotions such as udder cream, eucerin, or even Vaseline. Some chemotherapies can cause dry skin, color changes in your skin and nails.    . Avoid long, hot showers or baths. . Use gentle, fragrance-free soaps and laundry detergent. . Use  moisturizers, preferably creams or ointments rather than lotions because the thicker consistency is better at preventing skin dehydration. Apply the cream or ointment within 15 minutes of showering. Reapply moisturizer at night, and moisturize your hands every time after you wash them.  Hair Loss (if your doctor says your hair will fall out)  . If your doctor says that your hair is likely to fall out, decide before you begin chemo whether you want to wear a wig. You may want to shop before treatment to match your hair color. . Hats, turbans, and scarves can also camouflage hair loss, although some  people prefer to leave their heads uncovered. If you go bare-headed outdoors, be sure to use sunscreen on your scalp. . Cut your hair short. It eases the inconvenience of shedding lots of hair, but it also can reduce the emotional impact of watching your hair fall out. . Don't perm or color your hair during chemotherapy. Those chemical treatments are already damaging to hair and can enhance hair loss. Once your chemo treatments are done and your hair has grown back, it's OK to resume dyeing or perming hair. With chemotherapy, hair loss is almost always temporary. But when it grows back, it may be a different color or texture. In older adults who still had hair color before chemotherapy, the new growth may be completely gray.  Often, new hair is very fine and soft.  Infection Prevention Please wash your hands for at least 30 seconds using warm soapy water. Handwashing is the #1 way to prevent the spread of germs. Stay away from sick people or people who are getting over a cold. If you develop respiratory systems such as green/yellow mucus production or productive cough or persistent cough let us know and we will see if you need an antibiotic. It is a good idea to keep a pair of gloves on when going into grocery stores/[employer] to decrease your risk of coming into contact with germs on the carts, etc. Carry alcohol  hand gel with you at all times and use it frequently if out in public. If your temperature reaches 100.5 or higher please call the clinic and let us know.  If it is after hours or on the weekend please go to the ER if your temperature is over 100.5.  Please have your own personal thermometer at home to use.    Sex and bodily fluids If you are going to have sex, a condom must be used to protect the person that isn't taking chemotherapy. Chemo can decrease your libido (sex drive). For a few days after chemotherapy, chemotherapy can be excreted through your bodily fluids.  When using the toilet please close the lid and flush the toilet twice.  Do this for a few day after you have had chemotherapy.     Effects of chemotherapy on your sex life Some changes are simple and won't last long. They won't affect your sex life permanently. Sometimes you may feel: . too tired . not strong enough to be very active . sick or sore  . not in the mood . anxious or low Your anxiety might not seem related to sex. For example, you may be worried about the cancer and how your treatment is going. Or you may be worried about money, or about how you family are coping with your illness. These things can cause stress, which can affect your interest in sex. It's important to talk to your partner about how you feel. Remember - the changes to your sex life don't usually last long. There's usually no medical reason to stop having sex during chemo. The drugs won't have any long term physical effects on your performance or enjoyment of sex. Cancer can't be passed on to your partner during sex  Contraception It's important to use reliable contraception during treatment. Avoid getting pregnant while you or your partner are having chemotherapy. This is because the drugs may harm the baby. Sometimes chemotherapy drugs can leave a man or woman infertile.  This means you would not be able to have children in the future. You might want  to talk to someone about permanent infertility. It can be very difficult to learn that you may no longer be able to have children. Some people find counselling helpful. There might be ways to preserve your fertility, although this is easier for men than for women. You may want to speak to a fertility expert. You can talk about sperm banking or harvesting your eggs. You can also ask about other fertility options, such as donor eggs. If you have or have had breast cancer, your doctor might advise you not to take the contraceptive pill. This is because the hormones in it might affect the cancer.  It is not known for sure whether or not chemotherapy drugs can be passed on through semen or secretions from the vagina. Because of this some doctors advise people to use a barrier method if you have sex during treatment. This applies to vaginal, anal or oral sex. Generally, doctors advise a barrier method only for the time you are actually having the treatment and for about a week after your treatment. Advice like this can be worrying, but this does not mean that you have to avoid being intimate with your partner. You can still have close contact with your partner and continue to enjoy sex.  Animals If you have cats or birds we just ask that you not change the litter or change the cage.  Please have someone else do this for you while you are on chemotherapy.   Food Safety During and After Cancer Treatment Food safety is important for people both during and after cancer treatment. Cancer and cancer treatments, such as chemotherapy, radiation therapy, and stem cell/bone marrow transplantation, often weaken the immune system. This makes it harder for your body to protect itself from foodborne illness, also called food poisoning. Foodborne illness is caused by eating food that contains harmful bacteria, parasites, or viruses.  Foods to avoid Some foods have a higher risk of becoming tainted with bacteria. These  include: Marland Kitchen Unwashed fresh fruit and vegetables, especially leafy vegetables that can hide dirt and other contaminants . Raw sprouts, such as alfalfa sprouts . Raw or undercooked beef, especially ground beef, or other raw or undercooked meat and poultry . Fatty, fried, or spicy foods immediately before or after treatment.  These can sit heavy on your stomach and make you feel nauseous. . Raw or undercooked shellfish, such as oysters. . Sushi and sashimi, which often contain raw fish.  . Unpasteurized beverages, such as unpasteurized fruit juices, raw milk, raw yogurt, or cider . Undercooked eggs, such as soft boiled, over easy, and poached; raw, unpasteurized eggs; or foods made with raw egg, such as homemade raw cookie dough and homemade mayonnaise Simple steps for food safety Shop smart. . Do not buy food stored or displayed in an unclean area. . Do not buy bruised or damaged fruits or vegetables. . Do not buy cans that have cracks, dents, or bulges. . Pick up foods that can spoil at the end of your shopping trip and store them in a cooler on the way home. Prepare and clean up foods carefully. . Rinse all fresh fruits and vegetables under running water, and dry them with a clean towel or paper towel. . Clean the top of cans before opening them. . After preparing food, wash your hands for 20 seconds with hot water and soap. Pay special attention to areas between fingers and under nails. . Clean your utensils and dishes with hot water and soap. Marland Kitchen Disinfect  your kitchen and cutting boards using 1 teaspoon of liquid, unscented bleach mixed into 1 quart of water.   Dispose of old food. . Eat canned and packaged food before its expiration date (the "use by" or "best before" date). . Consume refrigerated leftovers within 3 to 4 days. After that time, throw out the food. Even if the food does not smell or look spoiled, it still may be unsafe. Some bacteria, such as Listeria, can grow even on foods  stored in the refrigerator if they are kept for too long. Take precautions when eating out. . At restaurants, avoid buffets and salad bars where food sits out for a long time and comes in contact with many people. Food can become contaminated when someone with a virus, often a norovirus, or another "bug" handles it. . Put any leftover food in a "to-go" container yourself, rather than having the server do it. And, refrigerate leftovers as soon as you get home. . Choose restaurants that are clean and that are willing to prepare your food as you order it cooked.      MEDICATIONS:                                                                                                       Zofran/Ondansetron 8mg  tablet. Take 1 tablet every 8 hours as needed for nausea/vomiting. (#1 nausea med to take, this can constipate)  Compazine/Prochlorperazine 10mg  tablet. Take 1 tablet every 6 hours as needed for nausea/vomiting. (#2 nausea med to take, this can make you sleepy)   EMLA cream. Apply a quarter size amount to port site 1 hour prior to chemo. Do not rub in. Cover with plastic wrap.   Over-the-Counter Meds:  Miralax 1 capful in 8 oz of fluid daily. May increase to two times a day if needed. This is a stool softener. If this doesn't work proceed you can add:  Senokot S-start with 1 tablet two times a day and increase to 4 tablets two times a day if needed. (total of 8 tablets in a 24 hour period). This is a stimulant laxative.   Call us if this does not help your bowels move.   Imodium 2mg  capsule. Take 2 capsules after the 1st loose stool and then 1 capsule every 2 hours until you go a total of 12 hours without having a loose stool. Call the [facility] if loose stools continue. If diarrhea occurs @ bedtime, take 2 capsules @ bedtime. Then take 2 capsules every 4 hours until morning. Call [facility].     Diarrhea Sheet  If you are having loose stools/diarrhea, please purchase Imodium and  begin taking as outlined:  At the first sign of poorly formed or loose stools you should begin taking Imodium(loperamide) 2 mg capsules.  Take two caplets (4mg ) followed by one caplet (2mg ) every 2 hours until you have had no diarrhea for 12 hours.  During the night take two caplets (4mg ) at bedtime and continue every 4 hours during the night until the morning.  Stop taking Imodium only after there is  no sign of diarrhea for 12 hours.    Always call the [facility] if you are having loose stools/diarrhea that you can't get under control.  Loose stools/disrrhea leads to dehydration (loss of water) in your body.  We have other options of trying to get the loose stools/diarrhea to stopped but you must let us know!     Constipation Sheet *Miralax in 8 oz of fluid daily.  May increase to two times a day if needed.  This is a stool softener.  If this not enough to keep your bowel regular:  You can add:  *Senokot S, start with one tablet twice a day and can increase to 4 tablets twice a day if needed.  This is a stimulant laxative.   Sometimes when you take pain medication you need BOTH a medicine to keep your stool soft and a medicine to help your bowel push it out!  Please call if the above does not work for you.   Do not go more than 2 days without a bowel movement.  It is very important that you do not become constipated.  It will make you feel sick to your stomach (nausea) and can cause abdominal pain and vomiting.     Nausea Sheet  Zofran/Ondansetron 8mg  tablet. Take 1 tablet every 8 hours as needed for nausea/vomiting. (#1 nausea med to take, this can constipate)  Compazine/Prochlorperazine 10mg  tablet. Take 1 tablet every 6 hours as needed for nausea/vomiting. (#2 nausea med to take, this can make you sleepy)  You can take these medications together or separately.  We would first like for you to try the Ondansetron by itself and then take the Prochloperizine if needed. But you are  allowed to take both medications at the same time if your nausea is that severe.  If you are having persistent nausea (nausea that does not stop) please take these medications on a staggered schedule so that the nausea medication stays in your body.  Please call the [facility] and let us know the amount of nausea that you are experiencing.  If you begin to vomit, you need to call the [facility] and if it is the weekend and you have vomited more than one time and cant get it to stop-go to the Emergency Room.  Persistent nausea/vomiting can lead to dehydration (loss of fluid in your body) and will make you feel terrible.   Ice chips, sips of clear liquids, foods that are @ room temperature, crackers, and toast tend to be better tolerated.     SYMPTOMS TO REPORT AS SOON AS POSSIBLE AFTER TREATMENT:  FEVER GREATER THAN 100.5 F  CHILLS WITH OR WITHOUT FEVER  NAUSEA AND VOMITING THAT IS NOT CONTROLLED WITH YOUR NAUSEA MEDICATION  UNUSUAL SHORTNESS OF BREATH  UNUSUAL BRUISING OR BLEEDING  TENDERNESS IN MOUTH AND THROAT WITH OR WITHOUT PRESENCE OF ULCERS  URINARY PROBLEMS  BOWEL PROBLEMS  UNUSUAL RASH    Wear comfortable clothing and clothing appropriate for easy access to any Portacath or PICC line. Let us know if there is anything that we can do to make your therapy better!    What to do if you need assistance after hours or on the weekends: CALL [phone].  HOLD on the line, do not hang up.  You will hear multiple messages but at the end you will be connected with a nurse triage line.  They will contact Dr Whitney Muse if necessary.  Most of the time they will be able  to assist you.   Do not call the hospital operator.  Dr Whitney Muse will not answer phone calls received by them.     I have been informed and understand all of the instructions given to me and have received a copy. I have been instructed to call the clinic [phone] or my family physician as soon as possible for  continued medical care, if indicated. I do not have any more questions at this time but understand that I may call the [facility] or the Patient Navigator at [phone] during office hours should I have questions or need assistance in obtaining follow-up care.

## 2017-01-04 NOTE — Patient Instructions (Addendum)
[place] at [facility] Discharge Instructions  RECOMMENDATIONS MADE BY THE CONSULTANT AND ANY TEST RESULTS WILL BE SENT TO YOUR REFERRING PHYSICIAN.  You were seen today by Dr. Whitney Muse  Change to Bishop [place] (Navigator) to teach about Xeloda   [phone])  Biopsy in Interventional Radiology  Return to clinic after getting/starting Xeloda   Thank you for choosing [place] at [facility] to provide your oncology and hematology care.  To afford each patient quality time with our provider, please arrive at least 15 minutes before your scheduled appointment time.    If you have a lab appointment with the [facility] please come in thru the  Main Entrance and check in at the main information desk  You need to re-schedule your appointment should you arrive 10 or more minutes late.  We strive to give you quality time with our providers, and arriving late affects you and other patients whose appointments are after yours.  Also, if you no show three or more times for appointments you may be dismissed from the clinic at the providers discretion.     Again, thank you for choosing [facility].  Our hope is that these requests will decrease the amount of time that you wait before being seen by our physicians.       _____________________________________________________________  Should you have questions after your visit to [facility], please contact our office at [phone] between the hours of 8:30 a.m. and 4:30 p.m.  Voicemails left after 4:30 p.m. will not be returned until the following business day.  For prescription refill requests, have your pharmacy contact our office.       Resources For Cancer Patients and their Caregivers ? American Cancer Society: Can assist with transportation, wigs, general needs, runs Look Good Feel Better.        (325)456-1747 ? Cancer Care: Provides financial assistance,  online support groups, medication/co-pay assistance.  1-800-813-HOPE (239)358-9423) ? [facility] Assists [place] cancer patients and their families through emotional , educational and financial support.  508-019-7599 ? [place] DSS Where to apply for food stamps, Medicaid and utility assistance. 323-465-4791 ? [employer]: Transportation to medical appointments. 878-615-8945 ? Social Security Administration: May apply for disability if have a Stage IV cancer. 864-093-3779 343-556-6783 ? [employer], Disability and Transit Services: Assists with nutrition, care and transit needs. [facility] Support Programs: @10RELATIVEDAYS @ > Cancer Support Group  2nd Tuesday of the month 1pm-2pm, Journey Room  > Creative Journey  3rd Tuesday of the month 1130am-1pm, Journey Room  > Look Good Feel Better  1st Wednesday of the month 10am-12 noon, Journey Room (Call [place] to register [phone])

## 2017-01-04 NOTE — Progress Notes (Signed)
[facility]  PROGRESS NOTE  Patient Care Team: [name], MD as PCP - General (Family Medicine)  CHIEF COMPLAINTS/PURPOSE OF CONSULTATION:    Breast cancer, male ([place])   12/31/2009 Initial Biopsy    Biopsy of L breast       12/31/2009 Pathology Results    Invasive ductal carcinoma, ER/PR+, HER 2 negative      12/31/2009 Imaging    Ultrasound showing a 2.43 x 1.85 x 3 cm hypoechoic spiculated mass in the 12 o clock L breast retroareolar region      01/01/2010 -  Anti-estrogen oral therapy    Tamoxifen 20 mg daily      01/06/2010 Imaging    Bone scan abnormal uptake in the diaphysis of the R humerus, abnormal in the R third, fifth and sixth ribs, lesion also noted in the sternum.      02/03/2010 Surgery    Rod placement and fixation of R humerus by Dr. [name]      02/05/2010 - 02/18/2010 Radiation Therapy    30Gy in 10 fractions of 3 Gy per fraction to R pathologic fracture      03/11/2010 -  Chemotherapy    Denosumab monthly, now every 3 months. Started at [facility]       06/09/2016 Imaging    Three hypermetabolic osseous lesions in the sternum, left ilium and right ilium, as discussed above, likely represent osseous metastases. At this time, these are not recognizable on the CT images. 2. No extra skeletal metastatic disease identified in the neck, chest, abdomen or pelvis.      10/13/2016 Progression    PET shows various new and enlarging osseous metastatic lesions with no definite extra osseous metastatic disease currently identified.        HISTORY OF PRESENTING ILLNESS:  Johnny Lucas 71 y.o. male is here for ongoing follow-up of stage IV breast cancer with known bone metastases. (note I do not have any documentation of biopsy proven metastatic disease)  He was diagnosed with breast cancer in 2011. He never had a mastectomy or radiation, but did have a lumpectomy and has been on Tamoxifen. He also had to "have a nail" put in his right arm. Laramie said  his arm was never biopsied, but an x-ray "revealed a black spot." He had radiation. He does have regular mammograms.  Mr. Leichter presents to the cancer center today accompanied by his wife. I personally reviewed and went over PET results with the patient. He has been reluctant to consider the possibility that his cancer has "spread"   He is doing well. He has a lot of questions about his cancer. He still has some back "discomfort" which he takes Tylenol for. He notes that Tylenol alleviates his pain.  Denies any other complaints today.  He is willing to consider changing therapy.  No headaches, blurry vision, dizziness.   MEDICAL HISTORY:  Past Medical History:  Diagnosis Date  . Anxiety   . Breast cancer ([place]) 2011   Stave IV breast cancer; radiation and tamoxifen  . GERD (gastroesophageal reflux disease)   . Hypertension   . Macular degeneration     SURGICAL HISTORY: Past Surgical History:  Procedure Laterality Date  . BACK SURGERY    . HERNIA REPAIR    . right arm surgery      SOCIAL HISTORY: Social History   Social History  . Marital status: Married    Spouse name: N/A  . Number of children: 3  .  Years of education: N/A   Occupational History  . Not on file.   Social History Main Topics  . Smoking status: Never Smoker  . Smokeless tobacco: Former Systems developer  . Alcohol use 14.4 oz/week    24 Cans of beer per week  . Drug use: No  . Sexual activity: Not on file   Other Topics Concern  . Not on file   Social History Narrative  . No narrative on file  Married for 50 years.  He has 3 children and 5 grandchildren.  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 He has never smoked. He drinks beer.   FAMILY HISTORY: Family History  Problem Relation Age of Onset  . Stroke Mother   . Cancer Maternal Aunt     cancer NOS; died in her 6s  . Lung cancer Maternal Uncle     smoker  Both parents deceased at 69 and 45. Mom died of stroke  and dad of old age.   ALLERGIES:  has No Known Allergies.  MEDICATIONS:  Current Outpatient Prescriptions  Medication Sig Dispense Refill  . ALPRAZolam (XANAX) 0.5 MG tablet Take 1 tablet (0.5 mg total) by mouth at bedtime as needed. for sleep 30 tablet 2  . aspirin EC 81 MG tablet Take by mouth.    . ASTRAGALUS PO Take by mouth.    . Calcium Carb-Cholecalciferol (CALCIUM 500 + D3) 500-600 MG-UNIT TABS Take 2 tablets by mouth daily. 60 tablet 6  . Calcium Carbonate-Vitamin D 600-400 MG-UNIT tablet Take by mouth.    . capecitabine (XELODA) 150 MG tablet Take 1 tablet twice a day for total of 2150 mg daily for 7 days on and 7 days off 28 tablet 0  . capecitabine (XELODA) 500 MG tablet Take 4 tablets twice daily for total of 2150 mg for 7 days on and 7 days off 112 tablet 0  . metoprolol (LOPRESSOR) 50 MG tablet Take by mouth.    . metoprolol succinate (TOPROL-XL) 100 MG 24 hr tablet     . Multiple Vitamin (THERA) TABS Take by mouth.    . Multiple Vitamins-Minerals (OCUVITE PO) Take by mouth.    . ondansetron (ZOFRAN) 8 MG tablet Take 1 tablet (8 mg total) by mouth every 8 (eight) hours as needed for nausea or vomiting. 30 tablet 2  . pantoprazole (PROTONIX) 40 MG tablet Take by mouth.    . prochlorperazine (COMPAZINE) 10 MG tablet Take 1 tablet (10 mg total) by mouth every 6 (six) hours as needed for nausea or vomiting. 30 tablet 2  . sildenafil (VIAGRA) 25 MG tablet Take 2 tablets (50 mg total) by mouth as needed for erectile dysfunction. 15 tablet 3  . tamoxifen (NOLVADEX) 20 MG tablet TAKE 1 TABLET EVERY DAY    . vitamin B-12 (CYANOCOBALAMIN) 1000 MCG tablet Take by mouth.     No current facility-administered medications for this visit.     Review of Systems  Constitutional: Negative.   HENT: Negative.   Eyes: Negative.   Respiratory: Negative.   Cardiovascular: Negative.   Gastrointestinal: Negative.   Genitourinary: Negative.   Musculoskeletal: Positive for back pain  ("discomfort").  Skin: Negative.   Neurological: Negative.   Endo/Heme/Allergies: Negative.   Psychiatric/Behavioral: Negative.   All other systems reviewed and are negative. 14 point ROS was done and is otherwise as detailed above or in HPI   PHYSICAL EXAMINATION: ECOG PERFORMANCE STATUS: 0 - Asymptomatic  Vitals:  01/04/17 1310  BP: (!) 158/80  Pulse: 66  Resp: 18  Temp: 98.6 F (37 C)   Filed Weights   01/04/17 1310  Weight: 214 lb 12.8 oz (97.4 kg)    Physical Exam  Constitutional: He is oriented to person, place, and time and well-developed, well-nourished, and in no distress.  Pt was able to get on exam table without assistance.   HENT:  Head: Normocephalic and atraumatic.  Nose: Nose normal.  Mouth/Throat: Oropharynx is clear and moist. No oropharyngeal exudate.  Eyes: Conjunctivae and EOM are normal. Pupils are equal, round, and reactive to light. Right eye exhibits no discharge. Left eye exhibits no discharge. No scleral icterus.  Neck: Normal range of motion. Neck supple. No tracheal deviation present. No thyromegaly present.  Cardiovascular: Normal rate, regular rhythm and normal heart sounds.  Exam reveals no gallop and no friction rub.   No murmur heard. Pulmonary/Chest: Effort normal and breath sounds normal. He has no wheezes. He has no rales.    Abdominal: Soft. Bowel sounds are normal. He exhibits no distension and no mass. There is no tenderness. There is no rebound and no guarding.  Musculoskeletal: Normal range of motion. He exhibits no edema.  Lymphadenopathy:    He has no cervical adenopathy.  Neurological: He is alert and oriented to person, place, and time. He has normal reflexes. No cranial nerve deficit. Gait normal. Coordination normal.  Skin: Skin is warm and dry. No rash noted.  Psychiatric: Mood, memory, affect and judgment normal.  Nursing note and vitals reviewed.   LABORATORY DATA:  I have reviewed the data as listed Lab Results    Component Value Date   WBC 6.7 01/08/2017   HGB 11.3 (L) 01/08/2017   HCT 32.8 (L) 01/08/2017   MCV 95.9 01/08/2017   PLT 180 01/08/2017   CMP     Component Value Date/Time   NA 136 01/08/2017 1029   K 4.3 01/08/2017 1029   CL 107 01/08/2017 1029   CO2 27 01/08/2017 1029   GLUCOSE 102 (H) 01/08/2017 1029   BUN 15 01/08/2017 1029   CREATININE 1.16 01/08/2017 1029   CALCIUM 8.7 (L) 01/08/2017 1029   PROT 8.4 (H) 01/08/2017 1029   ALBUMIN 3.5 01/08/2017 1029   AST 28 01/08/2017 1029   ALT 20 01/08/2017 1029   ALKPHOS 27 (L) 01/08/2017 1029   BILITOT 0.3 01/08/2017 1029   GFRNONAA >60 01/08/2017 1029   GFRAA >60 01/08/2017 1029    RADIOGRAPHIC STUDIES: I have personally reviewed the radiological images as listed and agreed with the findings in the report. No results found.   ASSESSMENT & PLAN:  Stage IV infiltrating ductal carcinoma L breast ER+ HER 2 - Bone metastases Tamoxifen 20 mg  Denosumab, now Q 3 months  Stage IV infiltrating ductal carcinoma of breast, on tamoxifen and on denosumab now every 3 months. .  12/31/2016 PET reviewed. There were various new and enlarging osseous metastatic lesions seen with no definite extra osseous metastatic disease identified.   We reviewed his progression of disease through Tamoxifen. I spoke with him about switching to Xeloda. He has never had a bone biopsy. He is agreeable to biopsy and XELODA. We will change his prolia to XGEVA. We discussed bone directed therapy.  We discussed the benefits of biopsy, one is to recheck prognostic markers.   I will write a referral for him to see Dr. Alinda Money at [facility] in the near future.    Give him a copy of  his PET scan.   He will return for a follow up after his biopsy.   All questions were answered. The patient knows to call the clinic with any problems, questions or concerns.  ORDERS PLACED FOR THIS ENCOUNTER: Orders Placed This Encounter  Procedures  . CT Biopsy    MEDICATIONS  PRESCRIBED THIS ENCOUNTER: Meds ordered this encounter  Medications  . metoprolol succinate (TOPROL-XL) 100 MG 24 hr tablet  . Calcium Carb-Cholecalciferol (CALCIUM 500 + D3) 500-600 MG-UNIT TABS    Sig: Take 2 tablets by mouth daily.    Dispense:  60 tablet    Refill:  6  . DISCONTD: capecitabine (XELODA) 500 MG tablet    Sig: Take 2150 mg twice daily for 7 days on and 7 days off    Dispense:  112 tablet    Refill:  0    Cycle 1  . DISCONTD: capecitabine (XELODA) 150 MG tablet    Sig: Take 2150 mg twice daily 7 days on and 7 days off    Dispense:  28 tablet    Refill:  0    Cycle 1  . capecitabine (XELODA) 150 MG tablet    Sig: Take 1 tablet twice a day for total of 2150 mg daily for 7 days on and 7 days off    Dispense:  28 tablet    Refill:  0    Cycle 1 Weight: 214 lbs Height: 5'8"  . capecitabine (XELODA) 500 MG tablet    Sig: Take 4 tablets twice daily for total of 2150 mg for 7 days on and 7 days off    Dispense:  112 tablet    Refill:  0    Cycle 1 Weight:  214 lbs Height: 5'8"   This document serves as a record of services personally performed by Ancil Linsey, MD. It was created on her behalf by Martinique Casey, a trained medical scribe. The creation of this record is based on the scribe's personal observations and the provider's statements to them. This document has been checked and approved by the attending provider..  I have reviewed the above documentation for accuracy and completeness, and I agree with the above.  This note was electronically signed.    Molli Hazard, MD  01/10/2017 4:57 PM

## 2017-01-05 MED ORDER — CAPECITABINE 150 MG PO TABS: ORAL_TABLET | ORAL | 0 refills | Status: DC

## 2017-01-05 MED ORDER — CAPECITABINE 500 MG PO TABS: ORAL_TABLET | ORAL | 0 refills | Status: DC

## 2017-01-05 MED ORDER — ONDANSETRON HCL 8 MG PO TABS: 8.0000 mg | ORAL_TABLET | Freq: Three times a day (TID) | ORAL | 2 refills | Status: DC | PRN

## 2017-01-05 MED ORDER — PROCHLORPERAZINE MALEATE 10 MG PO TABS: 10.0000 mg | ORAL_TABLET | Freq: Four times a day (QID) | ORAL | 2 refills | Status: DC | PRN

## 2017-01-06 ENCOUNTER — Encounter (HOSPITAL_COMMUNITY): Payer: Medicare Other

## 2017-01-06 ENCOUNTER — Other Ambulatory Visit (HOSPITAL_COMMUNITY): Payer: Self-pay | Admitting: Oncology

## 2017-01-06 DIAGNOSIS — N529 Male erectile dysfunction, unspecified: Secondary | ICD-10-CM | POA: Insufficient documentation

## 2017-01-06 DIAGNOSIS — Z170 Estrogen receptor positive status [ER+]: Secondary | ICD-10-CM

## 2017-01-06 DIAGNOSIS — C50922 Malignant neoplasm of unspecified site of left male breast: Secondary | ICD-10-CM

## 2017-01-06 DIAGNOSIS — C7951 Secondary malignant neoplasm of bone: Secondary | ICD-10-CM

## 2017-01-06 MED ORDER — SILDENAFIL CITRATE 25 MG PO TABS: 50.0000 mg | ORAL_TABLET | ORAL | 3 refills | Status: DC | PRN

## 2017-01-06 NOTE — Progress Notes (Signed)
Consent signed for Xeloda  

## 2017-01-07 ENCOUNTER — Encounter (HOSPITAL_COMMUNITY): Payer: Self-pay | Admitting: Emergency Medicine

## 2017-01-07 NOTE — Progress Notes (Signed)
Chemotherapy education pulled together. 

## 2017-01-08 ENCOUNTER — Encounter (HOSPITAL_COMMUNITY): Payer: Medicare Other

## 2017-01-08 DIAGNOSIS — C50122 Malignant neoplasm of central portion of left male breast: Secondary | ICD-10-CM | POA: Diagnosis not present

## 2017-01-08 DIAGNOSIS — Z170 Estrogen receptor positive status [ER+]: Secondary | ICD-10-CM

## 2017-01-08 DIAGNOSIS — C7951 Secondary malignant neoplasm of bone: Secondary | ICD-10-CM | POA: Diagnosis not present

## 2017-01-08 DIAGNOSIS — C50922 Malignant neoplasm of unspecified site of left male breast: Secondary | ICD-10-CM

## 2017-01-08 LAB — COMPREHENSIVE METABOLIC PANEL
ALT: 20 U/L (ref 17–63)
AST: 28 U/L (ref 15–41)
Albumin: 3.5 g/dL (ref 3.5–5.0)
Alkaline Phosphatase: 27 U/L — ABNORMAL LOW (ref 38–126)
BUN: 15 mg/dL (ref 6–20)
CO2: 27 mmol/L (ref 22–32)
Calcium: 8.7 mg/dL — ABNORMAL LOW (ref 8.9–10.3)
Chloride: 107 mmol/L (ref 101–111)
Creatinine, Ser: 1.16 mg/dL (ref 0.61–1.24)
GFR calc Af Amer: >60 mL/min (ref >60)
GFR calc non Af Amer: >60 mL/min (ref >60)
Glucose, Bld: 102 mg/dL — ABNORMAL HIGH (ref 65–99)
Potassium: 4.3 mmol/L (ref 3.5–5.1)
Sodium: 136 mmol/L (ref 135–145)
Total Bilirubin: 0.3 mg/dL (ref 0.3–1.2)
Total Protein: 8.4 g/dL — ABNORMAL HIGH (ref 6.5–8.1)

## 2017-01-08 LAB — CBC WITH DIFFERENTIAL/PLATELET
Basophils Absolute: 0 10*3/uL (ref 0.0–0.1)
Basophils Relative: 0 %
Eosinophils Absolute: 0.3 10*3/uL (ref 0.0–0.7)
Eosinophils Relative: 4 %
HCT: 32.8 % — ABNORMAL LOW (ref 39.0–52.0)
Hemoglobin: 11.3 g/dL — ABNORMAL LOW (ref 13.0–17.0)
Lymphocytes Relative: 33 %
Lymphs Abs: 2.2 10*3/uL (ref 0.7–4.0)
MCH: 33 pg (ref 26.0–34.0)
MCHC: 34.5 g/dL (ref 30.0–36.0)
MCV: 95.9 fL (ref 78.0–100.0)
Monocytes Absolute: 0.5 10*3/uL (ref 0.1–1.0)
Monocytes Relative: 7 %
Neutro Abs: 3.8 10*3/uL (ref 1.7–7.7)
Neutrophils Relative %: 56 %
Platelets: 180 10*3/uL (ref 150–400)
RBC: 3.42 MIL/uL — ABNORMAL LOW (ref 4.22–5.81)
RDW: 13.1 % (ref 11.5–15.5)
WBC: 6.7 10*3/uL (ref 4.0–10.5)

## 2017-01-10 ENCOUNTER — Encounter (HOSPITAL_COMMUNITY): Payer: Self-pay | Admitting: Hematology & Oncology

## 2017-01-11 ENCOUNTER — Ambulatory Visit (HOSPITAL_COMMUNITY): Payer: Medicare Other

## 2017-01-11 ENCOUNTER — Other Ambulatory Visit: Payer: Self-pay | Admitting: Student

## 2017-01-11 ENCOUNTER — Telehealth (HOSPITAL_COMMUNITY): Payer: Self-pay

## 2017-01-11 ENCOUNTER — Other Ambulatory Visit (HOSPITAL_COMMUNITY): Payer: Medicare Other

## 2017-01-11 NOTE — Telephone Encounter (Signed)
See telephone encounter notes 

## 2017-01-11 NOTE — Progress Notes (Deleted)
Labs reviewed from 01/08/17 and Calcium was 8.7. Corrected Calcium was too low as well per Cheral Marker, so Xgeva injection was held today. Reviewed with pt to continue to take his Calcium 2 tabs daily. Pt verbalized understanding. Future appts given to pt.

## 2017-01-12 ENCOUNTER — Ambulatory Visit (HOSPITAL_COMMUNITY)
Admission: RE | Admit: 2017-01-12 | Discharge: 2017-01-12 | Disposition: A | Discharge status: Home / Self Care | Payer: Medicare Other | Source: Ambulatory Visit | Attending: Hematology & Oncology | Admitting: Hematology & Oncology

## 2017-01-12 ENCOUNTER — Encounter (HOSPITAL_COMMUNITY): Payer: Self-pay

## 2017-01-12 DIAGNOSIS — Z170 Estrogen receptor positive status [ER+]: Secondary | ICD-10-CM

## 2017-01-12 DIAGNOSIS — F419 Anxiety disorder, unspecified: Secondary | ICD-10-CM | POA: Insufficient documentation

## 2017-01-12 DIAGNOSIS — Z923 Personal history of irradiation: Secondary | ICD-10-CM | POA: Diagnosis not present

## 2017-01-12 DIAGNOSIS — C50122 Malignant neoplasm of central portion of left male breast: Secondary | ICD-10-CM

## 2017-01-12 DIAGNOSIS — C7951 Secondary malignant neoplasm of bone: Secondary | ICD-10-CM | POA: Diagnosis not present

## 2017-01-12 DIAGNOSIS — H3530 Unspecified macular degeneration: Secondary | ICD-10-CM | POA: Insufficient documentation

## 2017-01-12 DIAGNOSIS — I10 Essential (primary) hypertension: Secondary | ICD-10-CM | POA: Insufficient documentation

## 2017-01-12 DIAGNOSIS — Z79899 Other long term (current) drug therapy: Secondary | ICD-10-CM | POA: Diagnosis not present

## 2017-01-12 DIAGNOSIS — Z823 Family history of stroke: Secondary | ICD-10-CM | POA: Diagnosis not present

## 2017-01-12 DIAGNOSIS — Z79810 Long term (current) use of selective estrogen receptor modulators (SERMs): Secondary | ICD-10-CM | POA: Insufficient documentation

## 2017-01-12 DIAGNOSIS — Z7982 Long term (current) use of aspirin: Secondary | ICD-10-CM | POA: Insufficient documentation

## 2017-01-12 DIAGNOSIS — K219 Gastro-esophageal reflux disease without esophagitis: Secondary | ICD-10-CM | POA: Diagnosis not present

## 2017-01-12 DIAGNOSIS — Z853 Personal history of malignant neoplasm of breast: Secondary | ICD-10-CM | POA: Insufficient documentation

## 2017-01-12 DIAGNOSIS — M899 Disorder of bone, unspecified: Secondary | ICD-10-CM | POA: Insufficient documentation

## 2017-01-12 DIAGNOSIS — M8589 Other specified disorders of bone density and structure, multiple sites: Secondary | ICD-10-CM | POA: Diagnosis not present

## 2017-01-12 DIAGNOSIS — D492 Neoplasm of unspecified behavior of bone, soft tissue, and skin: Secondary | ICD-10-CM | POA: Diagnosis not present

## 2017-01-12 LAB — CBC
HCT: 33.5 % — ABNORMAL LOW (ref 39.0–52.0)
Hemoglobin: 11.4 g/dL — ABNORMAL LOW (ref 13.0–17.0)
MCH: 32.4 pg (ref 26.0–34.0)
MCHC: 34 g/dL (ref 30.0–36.0)
MCV: 95.2 fL (ref 78.0–100.0)
Platelets: 182 10*3/uL (ref 150–400)
RBC: 3.52 MIL/uL — ABNORMAL LOW (ref 4.22–5.81)
RDW: 13.5 % (ref 11.5–15.5)
WBC: 5.3 10*3/uL (ref 4.0–10.5)

## 2017-01-12 LAB — BASIC METABOLIC PANEL
Anion gap: 6 (ref 5–15)
BUN: 13 mg/dL (ref 6–20)
CALCIUM: 9 mg/dL (ref 8.9–10.3)
CO2: 23 mmol/L (ref 22–32)
CREATININE: 1.2 mg/dL (ref 0.61–1.24)
Chloride: 107 mmol/L (ref 101–111)
GFR calc Af Amer: >60 mL/min (ref >60)
GFR calc non Af Amer: >60 mL/min (ref >60)
GLUCOSE: 102 mg/dL — AB (ref 65–99)
Potassium: 4 mmol/L (ref 3.5–5.1)
Sodium: 136 mmol/L (ref 135–145)

## 2017-01-12 LAB — PROTIME-INR
INR: 1.13
PROTHROMBIN TIME: 14.6 s (ref 11.4–15.2)

## 2017-01-12 LAB — APTT: APTT: 30 s (ref 24–36)

## 2017-01-12 MED ORDER — MIDAZOLAM HCL 2 MG/2ML IJ SOLN
INTRAMUSCULAR | Status: AC | PRN
  Administered 2017-01-12 (×2): 0.5 mg via INTRAVENOUS
  Administered 2017-01-12 (×2): 1 mg via INTRAVENOUS
  Administered 2017-01-12 (×2): 0.5 mg via INTRAVENOUS

## 2017-01-12 MED ORDER — SODIUM CHLORIDE 0.9 % IV SOLN
INTRAVENOUS | Status: DC
  Administered 2017-01-12: 10:00:00 via INTRAVENOUS

## 2017-01-12 MED ORDER — MIDAZOLAM HCL 2 MG/2ML IJ SOLN
INTRAMUSCULAR | Status: AC
  Filled 2017-01-12: qty 6

## 2017-01-12 MED ORDER — FENTANYL CITRATE (PF) 100 MCG/2ML IJ SOLN
INTRAMUSCULAR | Status: AC | PRN
  Administered 2017-01-12 (×3): 50 ug via INTRAVENOUS

## 2017-01-12 MED ORDER — FENTANYL CITRATE (PF) 100 MCG/2ML IJ SOLN
INTRAMUSCULAR | Status: AC
  Filled 2017-01-12: qty 6

## 2017-01-12 NOTE — Discharge Instructions (Signed)
Needle Biopsy of the Bone, Care After °Introduction °Refer to this sheet in the next few weeks. These instructions provide you with information about caring for yourself after your procedure. Your health care provider may also give you more specific instructions. Your treatment has been planned according to current medical practices, but problems sometimes occur. Call your health care provider if you have any problems or questions after your procedure. °What can I expect after the procedure? °After your procedure, it is common to have soreness or tenderness at the puncture site. °Follow these instructions at home: °· Take over-the-counter and prescription medicines only as told by your health care provider. °· Bathe and shower as told by your health care provider. °· Follow instructions from your health care provider about: °¨ How to take care of your puncture site. °¨ When and how you should change your bandage (dressing). °¨ When you should remove your dressing. °· Check your puncture site every day for signs of infection. Watch for: °¨ Redness, swelling, or worsening pain. °¨ Fluid, blood, or pus. °· Return to your normal activities as told by your health care provider. °· Keep all follow-up visits as told by your health care provider. This is important. °Contact a health care provider if: °· You have redness, swelling, or worsening pain at the site of your puncture. °· You have fluid, blood, or pus coming from your puncture site. °· You have a fever. °· You have persistent nausea or vomiting. °Get help right away if: °· You develop a rash. °· You have difficulty breathing. °This information is not intended to replace advice given to you by your health care provider. Make sure you discuss any questions you have with your health care provider. °Document Released: 07/03/2005 Document Revised: 05/21/2016 Document Reviewed: 01/21/2015 °© 2017 Elsevier °Moderate Conscious Sedation, Adult, Care After °These  instructions provide you with information about caring for yourself after your procedure. Your health care provider may also give you more specific instructions. Your treatment has been planned according to current medical practices, but problems sometimes occur. Call your health care provider if you have any problems or questions after your procedure. °What can I expect after the procedure? °After your procedure, it is common: °· To feel sleepy for several hours. °· To feel clumsy and have poor balance for several hours. °· To have poor judgment for several hours. °· To vomit if you eat too soon. °Follow these instructions at home: °For at least 24 hours after the procedure:  °· Do not: °¨ Participate in activities where you could fall or become injured. °¨ Drive. °¨ Use heavy machinery. °¨ Drink alcohol. °¨ Take sleeping pills or medicines that cause drowsiness. °¨ Make important decisions or sign legal documents. °¨ Take care of children on your own. °· Rest. °Eating and drinking °· Follow the diet recommended by your health care provider. °· If you vomit: °¨ Drink water, juice, or soup when you can drink without vomiting. °¨ Make sure you have little or no nausea before eating solid foods. °General instructions °· Have a responsible adult stay with you until you are awake and alert. °· Take over-the-counter and prescription medicines only as told by your health care provider. °· If you smoke, do not smoke without supervision. °· Keep all follow-up visits as told by your health care provider. This is important. °Contact a health care provider if: °· You keep feeling nauseous or you keep vomiting. °· You feel light-headed. °· You develop a rash. °·   You have a fever. °Get help right away if: °· You have trouble breathing. °This information is not intended to replace advice given to you by your health care provider. Make sure you discuss any questions you have with your health care provider. °Document Released:  10/04/2013 Document Revised: 05/18/2016 Document Reviewed: 04/04/2016 °Elsevier Interactive Patient Education © 2017 [employer]. ° °

## 2017-01-12 NOTE — Consult Note (Signed)
Chief Complaint: Patient was seen in consultation today for CT-guided left iliac bone lesion biopsy  Referring Physician(s): Penland,Shannon K  Supervising Physician: Sandi Mariscal  Patient Status: [place]  History of Present Illness: Johnny Lucas is a 71 y.o. male with history of left breast cancer diagnosed in 2011, status post lumpectomy, tamoxifen and radiation therapy.Recent PET scan on 12/31/16 revealed: 1. Multifocal hypermetabolic osseous metastases throughout the axial and proximal appendicular skeleton, which are increased in size, number and metabolism since 10/13/2016 PET-CT. 2. 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 3. No additional sites of hypermetabolic metastatic disease. 4. Chronic right mastoid sinusitis. 5. Aortic atherosclerosis.  One vessel coronary atherosclerosis. Request now received for CT guided biopsy of a hypermetabolic left iliac bone lesion for further evaluation.   Past Medical History:  Diagnosis Date  . Anxiety   . Breast cancer ([place]) 2011   Stave IV breast cancer; radiation and tamoxifen  . GERD (gastroesophageal reflux disease)   . Hypertension   . Macular degeneration     Past Surgical History:  Procedure Laterality Date  . BACK SURGERY    . HERNIA REPAIR    . right arm surgery      Allergies: Patient has no known allergies.  Medications: Prior to Admission medications   Medication Sig Start Date End Date Taking? Authorizing Provider  ALPRAZolam Duanne Moron) 0.5 MG tablet Take 1 tablet (0.5 mg total) by mouth at bedtime as needed. for sleep 10/28/16  Yes Manon Hilding Kefalas, PA-C  ASTRAGALUS PO Take by mouth.   Yes Historical Provider, MD  Calcium Carb-Cholecalciferol (CALCIUM 500 + D3) 500-600 MG-UNIT TABS Take 2 tablets by mouth daily. 01/04/17  Yes Patrici Ranks, MD  Calcium Carbonate-Vitamin D 600-400  MG-UNIT tablet Take by mouth. 06/10/16  Yes Historical Provider, MD  metoprolol succinate (TOPROL-XL) 100 MG 24 hr tablet  12/25/16  Yes Historical Provider, MD  Multiple Vitamin (THERA) TABS Take by mouth. 06/10/16  Yes Historical Provider, MD  Multiple Vitamins-Minerals (OCUVITE PO) Take by mouth.   Yes Historical Provider, MD  pantoprazole (PROTONIX) 40 MG tablet Take by mouth. 07/21/15  Yes Historical Provider, MD  sildenafil (VIAGRA) 25 MG tablet Take 2 tablets (50 mg total) by mouth as needed for erectile dysfunction. 01/06/17  Yes Baird Cancer, PA-C  tamoxifen (NOLVADEX) 20 MG tablet TAKE 1 TABLET EVERY DAY 10/30/15  Yes Historical Provider, MD  vitamin B-12 (CYANOCOBALAMIN) 1000 MCG tablet Take by mouth.   Yes Historical Provider, MD  aspirin EC 81 MG tablet Take by mouth.    Historical Provider, MD  capecitabine (XELODA) 150 MG tablet Take 1 tablet twice a day for total of 2150 mg daily for 7 days on and 7 days off 01/05/17   Patrici Ranks, MD  capecitabine (XELODA) 500 MG tablet Take 4 tablets twice daily for total of 2150 mg for 7 days on and 7 days off 01/05/17   Patrici Ranks, MD  metoprolol (LOPRESSOR) 50 MG tablet Take by mouth. 01/22/16 01/21/17  Historical Provider, MD  ondansetron (ZOFRAN) 8 MG tablet Take 1 tablet (8 mg total) by mouth every 8 (eight) hours as needed for nausea or vomiting. 01/05/17   Patrici Ranks, MD  prochlorperazine (COMPAZINE) 10 MG tablet Take 1 tablet (10 mg total) by mouth every 6 (six) hours as needed for nausea or vomiting. 01/05/17   Larene Beach  Elio Forget, MD     Family History  Problem Relation Age of Onset  . Stroke Mother   . Cancer Maternal Aunt     cancer NOS; died in her 48s  . Lung cancer Maternal Uncle     smoker    Social History   Social History  . Marital status: Married    Spouse name: N/A  . Number of children: 3  . Years of education: N/A   Social History Main Topics  . Smoking status: Never Smoker  . Smokeless tobacco:  Former Systems developer  . Alcohol use 14.4 oz/week    24 Cans of beer per week  . Drug use: No  . Sexual activity: Not Asked   Other Topics Concern  . None   Social History Narrative  . None      Review of Systems  Currently denies fever, headache, chest pain, dyspnea, cough, abdominal/back pain, nausea, vomiting or abnormal bleeding. Vital Signs: BP (!) 161/92 (BP Location: Right Arm)   Pulse 71   Temp 98.2 F (36.8 C) (Oral)   Resp 18   SpO2 99%   Physical Exam Awake, alert. Chest clear to auscultation bilaterally. Heart with regular rate and rhythm. Abdomen soft, positive bowel sounds, nontender, Lower extremities- no edema  Mallampati Score:     Imaging: Nm Pet Image Restag (ps) Skull Base To Thigh  Result Date: 12/31/2016 CLINICAL DATA:  Subsequent treatment strategy for stage IV left breast cancer with bone metastases, initially diagnosed in 2011, presenting for restaging with ongoing denosumab and tamoxifen therapy. EXAM: NUCLEAR MEDICINE PET SKULL BASE TO THIGH TECHNIQUE: 10.6 mCi F-18 FDG was injected intravenously. Full-ring PET imaging was performed from the skull base to thigh after the radiotracer. CT data was obtained and used for attenuation correction and anatomic localization. FASTING BLOOD GLUCOSE:  Value: 99 mg/dl COMPARISON:  10/13/2016 PET-CT. FINDINGS: NECK No hypermetabolic lymph nodes in the neck. New focal hypermetabolism in the upper left thyroid cartilage with max SUV 10.2 with associated subtle sclerotic change on the CT images (series 4/ image 41). Stable complete opacification of the right mastoid sinus without hypermetabolism. CHEST Top-normal heart size. Left anterior descending coronary atherosclerosis. Mildly atherosclerotic nonaneurysmal thoracic aorta. No hypermetabolic axillary, mediastinal or hilar lymph nodes. No pleural effusions. No pneumothorax. No acute consolidative airspace disease, lung masses or significant pulmonary nodules. Stable surgical clip in  the left breast. ABDOMEN/PELVIS No abnormal hypermetabolic activity within the liver, pancreas, adrenal glands, or spleen. No hypermetabolic lymph nodes in the abdomen or pelvis. Simple 1.3 cm right liver lobe cyst. 2 subcentimeter hypodense left liver lobe lesions are too small to characterize and appears stable suggesting benign liver lesions. Simple 3.4 cm posterior interpolar left renal cyst. Atherosclerotic nonaneurysmal abdominal aorta. Prior right inguinal hernia repair. SKELETON Numerous hypermetabolic skeletal lesions throughout the spine, bilateral clavicles, bilateral shoulder girdles, bilateral ribs, sternum, sacrum, bilateral iliac bones and right proximal femoral shaft, which appear increased in size, number and metabolism, and are largely occult on the CT images. For example, a right proximal femoral chest hypermetabolic osseous lesion with max SUV 24.7 appears new. A supra-acetabular right iliac bone hypermetabolic osseous lesion with max SUV 26.8 appears increased in size and metabolism, previous max SUV 22.8. A right upper sacral hypermetabolic osseous lesion with max SUV 16.4 appears new. A right posterior sixth rib hypermetabolic osseous lesion with max SUV 12.5 is increased in size and metabolism, previous max SUV 7.2. L3 vertebral body hypermetabolic osseous lesion with max SUV  25.6 is increased from max SUV 7.2. IMPRESSION: 1. Multifocal hypermetabolic osseous metastases throughout the axial and proximal appendicular skeleton, which are increased in size, number and metabolism since 10/13/2016 PET-CT. 2. 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 3. No additional sites of hypermetabolic metastatic disease. 4. Chronic right mastoid sinusitis. 5. Aortic atherosclerosis.  One vessel coronary atherosclerosis. Electronically Signed   By: Ilona Sorrel M.D.   On: 12/31/2016 10:57     Labs:  CBC:  Recent Labs  12/10/16 1215 01/08/17 1029  WBC 5.7 6.7  HGB 11.4* 11.3*  HCT 34.2* 32.8*  PLT 151 180    COAGS: No results for input(s): INR, APTT in the last 8760 hours.  BMP:  Recent Labs  12/10/16 1215 01/08/17 1029  NA 135 136  K 4.3 4.3  CL 106 107  CO2 25 27  GLUCOSE 91 102*  BUN 13 15  CALCIUM 9.0 8.7*  CREATININE 1.16 1.16  GFRNONAA >60 >60  GFRAA >60 >60    LIVER FUNCTION TESTS:  Recent Labs  12/10/16 1215 01/08/17 1029  BILITOT 0.6 0.3  AST 27 28  ALT 20 20  ALKPHOS 25* 27*  PROT 7.7 8.4*  ALBUMIN 3.6 3.5    TUMOR MARKERS: No results for input(s): AFPTM, CEA, CA199, CHROMGRNA in the last 8760 hours.  Assessment and Plan: Patient with history of left breast cancer initially diagnosed in 2011, status post lumpectomy, tamoxifen and radiation therapy.Recent PET scan has revealed multifocal hypermetabolic osseous metastasis throughout the axial and proximal appendicular skeleton increased in size since prior PET from October 2017 as well as new focal hypermetabolism in the upper left thyroid cartilage.He presents today for CT-guided biopsy of a left iliac bone lesion for further evaluation.Risks and benefits discussed with the patient/wife including, but not limited to bleeding, infection, damage to adjacent structures or low yield requiring additional tests. All of the patient's questions were answered, patient is agreeable to proceed. Consent signed and in chart. Labs pending.     Thank you for this interesting consult.  I greatly enjoyed meeting Johnny Lucas and look forward to participating in their care.  A copy of this report was sent to the requesting provider on this date.  Electronically Signed: D. Rowe Robert 01/12/2017, 9:28 AM   I spent a total of 25 minutes in face to face in clinical consultation, greater than 50% of which was counseling/coordinating care for CT-guided left iliac bone lesion  biopsy

## 2017-01-12 NOTE — Procedures (Signed)
Technically successful CT guided biopsy of ill defined hypermetabolic lesion within the posterior aspect of the left ilium.  EBL: None.  No immediate post procedural complications.   Ronny Bacon, MD Pager #: [phone]

## 2017-01-13 ENCOUNTER — Telehealth (HOSPITAL_COMMUNITY): Payer: Self-pay | Admitting: Emergency Medicine

## 2017-01-13 NOTE — Telephone Encounter (Signed)
Pt called to let me know that he started taking his Xeloda this morning.  01/14/2016

## 2017-01-15 DIAGNOSIS — C9030 Solitary plasmacytoma not having achieved remission: Secondary | ICD-10-CM | POA: Insufficient documentation

## 2017-01-18 ENCOUNTER — Encounter (HOSPITAL_COMMUNITY): Payer: Medicare Other

## 2017-01-18 DIAGNOSIS — Z170 Estrogen receptor positive status [ER+]: Secondary | ICD-10-CM | POA: Diagnosis not present

## 2017-01-18 DIAGNOSIS — C7951 Secondary malignant neoplasm of bone: Secondary | ICD-10-CM | POA: Diagnosis not present

## 2017-01-18 DIAGNOSIS — C9030 Solitary plasmacytoma not having achieved remission: Secondary | ICD-10-CM

## 2017-01-18 DIAGNOSIS — C50122 Malignant neoplasm of central portion of left male breast: Secondary | ICD-10-CM | POA: Diagnosis not present

## 2017-01-18 LAB — CBC WITH DIFFERENTIAL/PLATELET
BASOS ABS: 0 10*3/uL (ref 0.0–0.1)
Basophils Relative: 0 %
Eosinophils Absolute: 0.2 10*3/uL (ref 0.0–0.7)
Eosinophils Relative: 4 %
HCT: 32.8 % — ABNORMAL LOW (ref 39.0–52.0)
Hemoglobin: 11.1 g/dL — ABNORMAL LOW (ref 13.0–17.0)
LYMPHS ABS: 2.1 10*3/uL (ref 0.7–4.0)
LYMPHS PCT: 40 %
MCH: 32.4 pg (ref 26.0–34.0)
MCHC: 33.8 g/dL (ref 30.0–36.0)
MCV: 95.6 fL (ref 78.0–100.0)
MONO ABS: 0.3 10*3/uL (ref 0.1–1.0)
MONOS PCT: 6 %
NEUTROS ABS: 2.6 10*3/uL (ref 1.7–7.7)
Neutrophils Relative %: 50 %
Platelets: 158 10*3/uL (ref 150–400)
RBC: 3.43 MIL/uL — ABNORMAL LOW (ref 4.22–5.81)
RDW: 13 % (ref 11.5–15.5)
WBC: 5.1 10*3/uL (ref 4.0–10.5)

## 2017-01-18 LAB — COMPREHENSIVE METABOLIC PANEL
ALT: 25 U/L (ref 17–63)
AST: 31 U/L (ref 15–41)
Albumin: 3.5 g/dL (ref 3.5–5.0)
Alkaline Phosphatase: 23 U/L — ABNORMAL LOW (ref 38–126)
Anion gap: 4 — ABNORMAL LOW (ref 5–15)
BILIRUBIN TOTAL: 0.6 mg/dL (ref 0.3–1.2)
BUN: 13 mg/dL (ref 6–20)
CO2: 24 mmol/L (ref 22–32)
CREATININE: 1.11 mg/dL (ref 0.61–1.24)
Calcium: 8.7 mg/dL — ABNORMAL LOW (ref 8.9–10.3)
Chloride: 108 mmol/L (ref 101–111)
GFR calc Af Amer: >60 mL/min (ref >60)
Glucose, Bld: 99 mg/dL (ref 65–99)
POTASSIUM: 3.8 mmol/L (ref 3.5–5.1)
Sodium: 136 mmol/L (ref 135–145)
TOTAL PROTEIN: 8.4 g/dL — AB (ref 6.5–8.1)

## 2017-01-19 LAB — PROTEIN ELECTROPHORESIS, SERUM
A/G Ratio: 0.8 (ref 0.7–1.7)
ALPHA-2-GLOBULIN: 0.6 g/dL (ref 0.4–1.0)
Albumin ELP: 3.5 g/dL (ref 2.9–4.4)
Alpha-1-Globulin: 0.2 g/dL (ref 0.0–0.4)
BETA GLOBULIN: 0.9 g/dL (ref 0.7–1.3)
Gamma Globulin: 2.7 g/dL — ABNORMAL HIGH (ref 0.4–1.8)
Globulin, Total: 4.5 g/dL — ABNORMAL HIGH (ref 2.2–3.9)
M-SPIKE, %: 2.3 g/dL — AB
PDF: 0
Total Protein ELP: 8 g/dL (ref 6.0–8.5)

## 2017-01-19 LAB — IGG, IGA, IGM
IGG (IMMUNOGLOBIN G), SERUM: 3225 mg/dL — AB (ref 700–1600)
IGM, SERUM: 26 mg/dL (ref 20–172)
IgA: 94 mg/dL (ref 61–437)

## 2017-01-19 LAB — KAPPA/LAMBDA LIGHT CHAINS
KAPPA, LAMDA LIGHT CHAIN RATIO: 272.96 — AB (ref 0.26–1.65)
Kappa free light chain: 2129.1 mg/L — ABNORMAL HIGH (ref 3.3–19.4)
Lambda free light chains: 7.8 mg/L (ref 5.7–26.3)

## 2017-01-19 LAB — BETA 2 MICROGLOBULIN, SERUM: BETA 2 MICROGLOBULIN: 3.3 mg/L — AB (ref 0.6–2.4)

## 2017-01-20 ENCOUNTER — Other Ambulatory Visit (HOSPITAL_COMMUNITY)
Admission: RE | Admit: 2017-01-20 | Discharge: 2017-01-20 | Disposition: A | Discharge status: Home / Self Care | Payer: Medicare Other | Source: Ambulatory Visit | Attending: Hematology & Oncology | Admitting: Hematology & Oncology

## 2017-01-20 DIAGNOSIS — C9030 Solitary plasmacytoma not having achieved remission: Secondary | ICD-10-CM

## 2017-01-20 LAB — IMMUNOFIXATION ELECTROPHORESIS
IGM, SERUM: 25 mg/dL (ref 20–172)
IgA: 97 mg/dL (ref 61–437)
IgG (Immunoglobin G), Serum: 3234 mg/dL — ABNORMAL HIGH (ref 700–1600)
TOTAL PROTEIN ELP: 8.1 g/dL (ref 6.0–8.5)

## 2017-01-21 LAB — UIFE/LIGHT CHAINS/TP QN, 24-HR UR
% BETA, Urine: 15.9 %
ALPHA 1 URINE: 4.3 %
Albumin, U: 22.2 %
Alpha 2, Urine: 10.9 %
FREE LT CHN EXCR RATE: 622 mg/L — AB (ref 1.35–24.19)
Free Kappa/Lambda Ratio: 172.3 — ABNORMAL HIGH (ref 2.04–10.37)
Free Lambda Lt Chains,Ur: 3.61 mg/L (ref 0.24–6.66)
GAMMA GLOBULIN URINE: 46.8 %
M-SPIKE %, Urine: 20.1 % — ABNORMAL HIGH
M-SPIKE, MG/24 HR: 52 mg/(24.h) — AB
PDF: 0
TOTAL PROTEIN, URINE-UR/DAY: 257 mg/(24.h) — AB (ref 30–150)
TOTAL VOLUME: 1725
Total Protein, Urine: 14.9 mg/dL

## 2017-01-21 LAB — IMMUNOFIXATION, URINE

## 2017-01-28 ENCOUNTER — Encounter (HOSPITAL_COMMUNITY): Payer: Medicare Other | Attending: Oncology | Admitting: Oncology

## 2017-01-28 ENCOUNTER — Encounter (HOSPITAL_COMMUNITY): Payer: Medicare Other

## 2017-01-28 ENCOUNTER — Encounter (HOSPITAL_COMMUNITY): Payer: Self-pay

## 2017-01-28 VITALS — BP 145/76 | HR 75 | Temp 98.5°F | Resp 18 | Wt 213.9 lb

## 2017-01-28 DIAGNOSIS — Z170 Estrogen receptor positive status [ER+]: Secondary | ICD-10-CM | POA: Diagnosis not present

## 2017-01-28 DIAGNOSIS — C7951 Secondary malignant neoplasm of bone: Secondary | ICD-10-CM

## 2017-01-28 DIAGNOSIS — C9000 Multiple myeloma not having achieved remission: Secondary | ICD-10-CM

## 2017-01-28 DIAGNOSIS — C50922 Malignant neoplasm of unspecified site of left male breast: Secondary | ICD-10-CM

## 2017-01-28 DIAGNOSIS — C50122 Malignant neoplasm of central portion of left male breast: Secondary | ICD-10-CM

## 2017-01-28 LAB — CBC WITH DIFFERENTIAL/PLATELET
Basophils Absolute: 0 10*3/uL (ref 0.0–0.1)
Basophils Relative: 1 %
Eosinophils Absolute: 0.2 10*3/uL (ref 0.0–0.7)
Eosinophils Relative: 5 %
HEMATOCRIT: 32.2 % — AB (ref 39.0–52.0)
HEMOGLOBIN: 10.9 g/dL — AB (ref 13.0–17.0)
LYMPHS ABS: 2 10*3/uL (ref 0.7–4.0)
LYMPHS PCT: 37 %
MCH: 32.6 pg (ref 26.0–34.0)
MCHC: 33.9 g/dL (ref 30.0–36.0)
MCV: 96.4 fL (ref 78.0–100.0)
MONOS PCT: 8 %
Monocytes Absolute: 0.4 10*3/uL (ref 0.1–1.0)
NEUTROS ABS: 2.6 10*3/uL (ref 1.7–7.7)
NEUTROS PCT: 49 %
Platelets: 162 10*3/uL (ref 150–400)
RBC: 3.34 MIL/uL — AB (ref 4.22–5.81)
RDW: 13.2 % (ref 11.5–15.5)
WBC: 5.3 10*3/uL (ref 4.0–10.5)

## 2017-01-28 LAB — COMPREHENSIVE METABOLIC PANEL
ALK PHOS: 27 U/L — AB (ref 38–126)
ALT: 30 U/L (ref 17–63)
AST: 34 U/L (ref 15–41)
Albumin: 3.5 g/dL (ref 3.5–5.0)
Anion gap: 6 (ref 5–15)
BILIRUBIN TOTAL: 0.5 mg/dL (ref 0.3–1.2)
BUN: 15 mg/dL (ref 6–20)
CALCIUM: 9.1 mg/dL (ref 8.9–10.3)
CO2: 27 mmol/L (ref 22–32)
CREATININE: 1.24 mg/dL (ref 0.61–1.24)
Chloride: 103 mmol/L (ref 101–111)
GFR calc Af Amer: >60 mL/min (ref >60)
GFR calc non Af Amer: 58 mL/min — ABNORMAL LOW (ref >60)
Glucose, Bld: 129 mg/dL — ABNORMAL HIGH (ref 65–99)
Potassium: 4.1 mmol/L (ref 3.5–5.1)
SODIUM: 136 mmol/L (ref 135–145)
TOTAL PROTEIN: 8.5 g/dL — AB (ref 6.5–8.1)

## 2017-01-28 NOTE — Progress Notes (Signed)
[place]  PROGRESS NOTE  Patient Care Team: [name], MD as PCP - General (Family Medicine)  CHIEF COMPLAINTS/PURPOSE OF CONSULTATION:    Breast cancer, male ([place])   12/31/2009 Initial Biopsy    Biopsy of L breast       12/31/2009 Pathology Results    Invasive ductal carcinoma, ER/PR+, HER 2 negative      12/31/2009 Imaging    Ultrasound showing a 2.43 x 1.85 x 3 cm hypoechoic spiculated mass in the 12 o clock L breast retroareolar region      01/01/2010 -  Anti-estrogen oral therapy    Tamoxifen 20 mg daily      01/06/2010 Imaging    Bone scan abnormal uptake in the diaphysis of the R humerus, abnormal in the R third, fifth and sixth ribs, lesion also noted in the sternum.      02/03/2010 Surgery    Rod placement and fixation of R humerus by Dr. [name]      02/05/2010 - 02/18/2010 Radiation Therapy    30Gy in 10 fractions of 3 Gy per fraction to R pathologic fracture      03/11/2010 -  Chemotherapy    Denosumab monthly, now every 3 months. Started at [facility]       06/09/2016 Imaging    Three hypermetabolic osseous lesions in the sternum, left ilium and right ilium, as discussed above, likely represent osseous metastases. At this time, these are not recognizable on the CT images. 2. No extra skeletal metastatic disease identified in the neck, chest, abdomen or pelvis.      10/13/2016 Progression    PET shows various new and enlarging osseous metastatic lesions with no definite extra osseous metastatic disease currently identified.       12/31/2016 Progression    1. Multifocal hypermetabolic osseous metastases throughout the axial and proximal appendicular skeleton, which are increased in size, number and metabolism since 10/13/2016 PET-CT. 2. 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 3. No additional sites of hypermetabolic metastatic  disease. 4. Chronic right mastoid sinusitis. 5. Aortic atherosclerosis.  One vessel coronary atherosclerosis.       HISTORY OF PRESENTING ILLNESS:  Johnny Lucas 71 y.o. male is here for ongoing follow-up of stage IV breast cancer with known bone metastases. (note I do not have any documentation of biopsy proven metastatic disease)  He was diagnosed with breast cancer in 2011. He never had a mastectomy or radiation, but did have a lumpectomy and has been on Tamoxifen. He also had to "have a nail" put in his right arm. Johnny Lucas said his arm was never biopsied, but an x-ray "revealed a black spot." He had radiation. He does have regular mammograms.  Johnny Lucas presents to the cancer center today wife and family continued follow up   He underwent posterior left ilium  bone biopsy Jan 12, 2017 demonstrating plasma cell neoplasm, with sections of bone marrow sheath replaced by aptypical palsma cells.   Multiple myleoma work up on Jan 18 2017, demonstrated a SPEP with a monoclonal spike in the gamma region, 2.3 g/dL. Immunofixation showed an IgG monoclonal protein with kappa  light chains specificity with monoclonal light chains (Bence Jones protein) Free kappa light were 2,129, free gamma light 7.8, kappa, gamma light ratio was 272.96. Beta 2 micro-globulin was 3.3. CMP performed today showed creatine 1.24, total protein 8.5 calcium level 9.1. Serum  immunoglobulin taken Janurary 22,18 serum IgG 3225, serum IgA  97, serum IgM is 25.  CBC performed today: wbc 5.3k, hemoglobin 10.9g/dL, hematocrit 32.2%, platelet 162k.  States he took 2 days of xeloda for initially suspected metastatic breast cancer with bone involvement however once he found out about the bone plasma cell neoplasm, he received a call from [place] to stop taking his xeloda.   States he feels well. He does not have any other complaints at this time.    MEDICAL HISTORY:  Past Medical History:  Diagnosis Date  . Anxiety   . Breast cancer  ([employer]) 2011   Stave IV breast cancer; radiation and tamoxifen  . GERD (gastroesophageal reflux disease)   . Hypertension   . Macular degeneration     SURGICAL HISTORY: Past Surgical History:  Procedure Laterality Date  . BACK SURGERY    . HERNIA REPAIR    . right arm surgery      SOCIAL HISTORY: Social History   Social History  . Marital status: Married    Spouse name: N/A  . Number of children: 3  . Years of education: N/A   Occupational History  . Not on file.   Social History Main Topics  . Smoking status: Never Smoker  . Smokeless tobacco: Former Systems developer  . Alcohol use 14.4 oz/week    24 Cans of beer per week  . Drug use: No  . Sexual activity: Not on file   Other Topics Concern  . Not on file   Social History Narrative  . No narrative on file  Married for 50 years.  He has 3 children and 5 grandchildren.  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 He has never smoked. He drinks beer.   FAMILY HISTORY: Family History  Problem Relation Age of Onset  . Stroke Mother   . Cancer Maternal Aunt     cancer NOS; died in her 15s  . Lung cancer Maternal Uncle     smoker  Both parents deceased at 68 and 9. Mom died of stroke and dad of old age.   ALLERGIES:  has No Known Allergies.  MEDICATIONS:  Current Outpatient Prescriptions  Medication Sig Dispense Refill  . Calcium Carb-Cholecalciferol 500-600 MG-UNIT TABS Take 2 tablets by mouth daily.    . calcium carbonate (TUMS - DOSED IN MG ELEMENTAL CALCIUM) 500 MG chewable tablet Chew 2 tablets by mouth daily.    Marland Kitchen aspirin EC 81 MG tablet Take by mouth.    . metoprolol succinate (TOPROL-XL) 100 MG 24 hr tablet     . Multiple Vitamins-Minerals (OCUVITE PO) Take by mouth.    . vitamin B-12 (CYANOCOBALAMIN) 1000 MCG tablet Take by mouth.     No current facility-administered medications for this visit.     Review of Systems  Constitutional: Negative.        Feeling well.     HENT: Negative.   Eyes: Negative.   Respiratory: Negative.   Cardiovascular: Negative.   Gastrointestinal: Negative.   Genitourinary: Negative.   Musculoskeletal: Negative.   Skin: Negative.   Neurological: Negative.   Endo/Heme/Allergies: Negative.   Psychiatric/Behavioral: Negative.   All other systems reviewed and are negative. 14 point ROS was done and is otherwise as detailed above or in HPI   PHYSICAL EXAMINATION: ECOG PERFORMANCE STATUS: 0 - Asymptomatic  Vitals:   01/28/17 0916  BP: (!) 145/76  Pulse: 75  Resp: 18  Temp: 98.5 F (  36.9 C)   Filed Weights   01/28/17 0916  Weight: 213 lb 14.4 oz (97 kg)     Physical Exam  Constitutional: He is oriented to person, place, and time and well-developed, well-nourished, and in no distress.  HENT:  Head: Normocephalic and atraumatic.  Nose: Nose normal.  Mouth/Throat: Oropharynx is clear and moist. No oropharyngeal exudate.  Eyes: Conjunctivae and EOM are normal. Pupils are equal, round, and reactive to light. Right eye exhibits no discharge. Left eye exhibits no discharge. No scleral icterus.  Neck: Normal range of motion. Neck supple. No tracheal deviation present. No thyromegaly present.  Cardiovascular: Normal rate, regular rhythm and normal heart sounds.  Exam reveals no gallop and no friction rub.   No murmur heard. Pulmonary/Chest: Effort normal and breath sounds normal. He has no wheezes. He has no rales.  Abdominal: Soft. Bowel sounds are normal. He exhibits no distension and no mass. There is no tenderness. There is no rebound and no guarding.  Musculoskeletal: Normal range of motion. He exhibits no edema.  Lymphadenopathy:    He has no cervical adenopathy.  Neurological: He is alert and oriented to person, place, and time. He has normal reflexes. No cranial nerve deficit. Gait normal. Coordination normal.  Skin: Skin is warm and dry. No rash noted.  Psychiatric: Mood, memory, affect and judgment normal.   Nursing note and vitals reviewed.   LABORATORY DATA:  I have reviewed the data as listed Lab Results  Component Value Date   WBC 5.3 01/28/2017   HGB 10.9 (L) 01/28/2017   HCT 32.2 (L) 01/28/2017   MCV 96.4 01/28/2017   PLT 162 01/28/2017   CMP     Component Value Date/Time   NA 136 01/28/2017 0844   K 4.1 01/28/2017 0844   CL 103 01/28/2017 0844   CO2 27 01/28/2017 0844   GLUCOSE 129 (H) 01/28/2017 0844   BUN 15 01/28/2017 0844   CREATININE 1.24 01/28/2017 0844   CALCIUM 9.1 01/28/2017 0844   PROT 8.5 (H) 01/28/2017 0844   ALBUMIN 3.5 01/28/2017 0844   AST 34 01/28/2017 0844   ALT 30 01/28/2017 0844   ALKPHOS 27 (L) 01/28/2017 0844   BILITOT 0.5 01/28/2017 0844   GFRNONAA 58 (L) 01/28/2017 0844   GFRAA >60 01/28/2017 0844    RADIOGRAPHIC STUDIES: I have personally reviewed the radiological images as listed and agreed with the findings in the report. No results found.  Study Results  CT-GUIDED BONE MARROW BIOPSY AND ASPIRATION 01/12/2017  IMPRESSION: Technically successful CT guided biopsy of poorly well-defined area of abnormal hypermetabolic activity within the posterosuperior aspect of the left ilium.  PATHOLOGY:    ASSESSMENT & PLAN:  Stage IV infiltrating ductal carcinoma L breast ER+ HER 2 - Bone metastases due to underlying plasma neoplasm. Suspect IgG kappa multiple myeloma with light chains. Tamoxifen 20 mg  Denosumab, now Q 3 months  Stage IV infiltrating ductal carcinoma of breast, on tamoxifen and on denosumab now every 3 months. .  I have dicussed in detail his lab results for his suspected multiple myleoma with him and his family today. Also reviewed his bone biopsy scans which  demonstrated a plasma cell neoplasm.  I will send patient for a bone marrow biopsy and aspirate and send for surgical path, flow cytometry, and cytogenetics.   I have told him that his bone disease is due to multiple myeloma. His bone disease is not due to  metastatic spread from his breast cancer. Resume tamoxifen.  RTC in  2-3 weeks for review of his bone marrow biopsy results and further treatment.       ORDERS PLACED FOR THIS ENCOUNTER: Orders Placed This Encounter  Procedures  . CT Biopsy    MEDICATIONS PRESCRIBED THIS ENCOUNTER: Meds ordered this encounter  Medications  . Calcium Carb-Cholecalciferol 500-600 MG-UNIT TABS    Sig: Take 2 tablets by mouth daily.  . calcium carbonate (TUMS - DOSED IN MG ELEMENTAL CALCIUM) 500 MG chewable tablet    Sig: Chew 2 tablets by mouth daily.   This document serves as a record of services personally performed by Twana First, MD. It was created on her behalf by Shirlean Mylar, a trained medical scribe. The creation of this record is based on the scribe's personal observations and the provider's statements to them. This document has been checked and approved by the attending provider.  I have reviewed the above documentation for accuracy and completeness, and I agree with the above.  This note was electronically signed.    Mikey [employer]  01/28/2017 10:49 AM

## 2017-01-28 NOTE — Patient Instructions (Signed)
[place] at [facility] Discharge Instructions  RECOMMENDATIONS MADE BY THE CONSULTANT AND ANY TEST RESULTS WILL BE SENT TO YOUR REFERRING PHYSICIAN.  You were seen today by Dr. Barron Schmid We will get you set up for bone marrow biopsy Follow up in 2 weeks  Thank you for choosing [place] at [facility] to provide your oncology and hematology care.  To afford each patient quality time with our provider, please arrive at least 15 minutes before your scheduled appointment time.    If you have a lab appointment with the [place] please come in thru the  Main Entrance and check in at the main information desk  You need to re-schedule your appointment should you arrive 10 or more minutes late.  We strive to give you quality time with our providers, and arriving late affects you and other patients whose appointments are after yours.  Also, if you no show three or more times for appointments you may be dismissed from the clinic at the providers discretion.     Again, thank you for choosing [facility]  Our hope is that these requests will decrease the amount of time that you wait before being seen by our physicians.       _____________________________________________________________  Should you have questions after your visit to [facility], please contact our office at [phone] between the hours of 8:30 a.m. and 4:30 p.m.  Voicemails left after 4:30 p.m. will not be returned until the following business day.  For prescription refill requests, have your pharmacy contact our office.       Resources For Cancer Patients and their Caregivers ? American Cancer Society: Can assist with transportation, wigs, general needs, runs Look Good Feel Better.        302-649-5608 ? Cancer Care: Provides financial assistance, online support groups, medication/co-pay assistance.  1-800-813-HOPE 9367605040) ? [facility] Assists [place] cancer patients and their families through emotional , educational and financial support.  (614)119-1972 ? [place] DSS Where to apply for food stamps, Medicaid and utility assistance. 6623335370 ? [facility]: Transportation to medical appointments. 719 160 2679 ? Social Security Administration: May apply for disability if have a Stage IV cancer. 539-835-9644 814-122-1256 ? [employer], Disability and Transit Services: Assists with nutrition, care and transit needs. [place] Support Programs: @10RELATIVEDAYS @ > Cancer Support Group  2nd Tuesday of the month 1pm-2pm, Journey Room  > Creative Journey  3rd Tuesday of the month 1130am-1pm, Journey Room  > Look Good Feel Better  1st Wednesday of the month 10am-12 noon, Journey Room (Call [place] to register [phone])

## 2017-01-29 ENCOUNTER — Other Ambulatory Visit (HOSPITAL_COMMUNITY): Payer: Medicare Other

## 2017-01-29 ENCOUNTER — Ambulatory Visit (HOSPITAL_COMMUNITY): Payer: Medicare Other | Admitting: Hematology & Oncology

## 2017-02-01 ENCOUNTER — Other Ambulatory Visit: Payer: Self-pay | Admitting: General Surgery

## 2017-02-02 ENCOUNTER — Ambulatory Visit (HOSPITAL_COMMUNITY)
Admission: RE | Admit: 2017-02-02 | Discharge: 2017-02-02 | Disposition: A | Discharge status: Home / Self Care | Payer: Medicare Other | Source: Ambulatory Visit | Attending: Oncology | Admitting: Oncology

## 2017-02-02 ENCOUNTER — Other Ambulatory Visit (HOSPITAL_COMMUNITY): Payer: Self-pay | Admitting: Oncology

## 2017-02-02 ENCOUNTER — Encounter (HOSPITAL_COMMUNITY): Payer: Self-pay

## 2017-02-02 DIAGNOSIS — Z79810 Long term (current) use of selective estrogen receptor modulators (SERMs): Secondary | ICD-10-CM | POA: Insufficient documentation

## 2017-02-02 DIAGNOSIS — Z853 Personal history of malignant neoplasm of breast: Secondary | ICD-10-CM | POA: Insufficient documentation

## 2017-02-02 DIAGNOSIS — D649 Anemia, unspecified: Secondary | ICD-10-CM | POA: Insufficient documentation

## 2017-02-02 DIAGNOSIS — Z823 Family history of stroke: Secondary | ICD-10-CM | POA: Diagnosis not present

## 2017-02-02 DIAGNOSIS — Z923 Personal history of irradiation: Secondary | ICD-10-CM | POA: Insufficient documentation

## 2017-02-02 DIAGNOSIS — I10 Essential (primary) hypertension: Secondary | ICD-10-CM | POA: Diagnosis not present

## 2017-02-02 DIAGNOSIS — Z79899 Other long term (current) drug therapy: Secondary | ICD-10-CM | POA: Diagnosis not present

## 2017-02-02 DIAGNOSIS — C9000 Multiple myeloma not having achieved remission: Secondary | ICD-10-CM

## 2017-02-02 DIAGNOSIS — K219 Gastro-esophageal reflux disease without esophagitis: Secondary | ICD-10-CM | POA: Diagnosis not present

## 2017-02-02 DIAGNOSIS — H3530 Unspecified macular degeneration: Secondary | ICD-10-CM | POA: Insufficient documentation

## 2017-02-02 DIAGNOSIS — F419 Anxiety disorder, unspecified: Secondary | ICD-10-CM | POA: Insufficient documentation

## 2017-02-02 DIAGNOSIS — Z7982 Long term (current) use of aspirin: Secondary | ICD-10-CM | POA: Diagnosis not present

## 2017-02-02 DIAGNOSIS — M899 Disorder of bone, unspecified: Secondary | ICD-10-CM | POA: Diagnosis present

## 2017-02-02 LAB — APTT: APTT: 30 s (ref 24–36)

## 2017-02-02 LAB — CBC
HEMATOCRIT: 31.4 % — AB (ref 39.0–52.0)
Hemoglobin: 10.5 g/dL — ABNORMAL LOW (ref 13.0–17.0)
MCH: 31.4 pg (ref 26.0–34.0)
MCHC: 33.4 g/dL (ref 30.0–36.0)
MCV: 94 fL (ref 78.0–100.0)
PLATELETS: 172 10*3/uL (ref 150–400)
RBC: 3.34 MIL/uL — AB (ref 4.22–5.81)
RDW: 13.3 % (ref 11.5–15.5)
WBC: 5.5 10*3/uL (ref 4.0–10.5)

## 2017-02-02 LAB — PROTIME-INR
INR: 1.15
Prothrombin Time: 14.8 seconds (ref 11.4–15.2)

## 2017-02-02 MED ORDER — NALOXONE HCL 0.4 MG/ML IJ SOLN
INTRAMUSCULAR | Status: AC
  Filled 2017-02-02: qty 1

## 2017-02-02 MED ORDER — SODIUM CHLORIDE 0.9 % IV SOLN
INTRAVENOUS | Status: DC
  Administered 2017-02-02: 08:00:00 via INTRAVENOUS

## 2017-02-02 MED ORDER — FENTANYL CITRATE (PF) 100 MCG/2ML IJ SOLN
INTRAMUSCULAR | Status: AC
  Filled 2017-02-02: qty 4

## 2017-02-02 MED ORDER — MIDAZOLAM HCL 2 MG/2ML IJ SOLN
INTRAMUSCULAR | Status: AC
  Filled 2017-02-02: qty 6

## 2017-02-02 MED ORDER — MIDAZOLAM HCL 2 MG/2ML IJ SOLN
INTRAMUSCULAR | Status: AC | PRN
  Administered 2017-02-02 (×2): 1 mg via INTRAVENOUS

## 2017-02-02 MED ORDER — FENTANYL CITRATE (PF) 100 MCG/2ML IJ SOLN
INTRAMUSCULAR | Status: AC | PRN
  Administered 2017-02-02 (×2): 50 ug via INTRAVENOUS

## 2017-02-02 MED ORDER — FLUMAZENIL 0.5 MG/5ML IV SOLN
INTRAVENOUS | Status: AC
  Filled 2017-02-02: qty 5

## 2017-02-02 NOTE — Discharge Instructions (Signed)
Bone Marrow Aspiration and Bone Marrow Biopsy, Adult, Care After This sheet gives you information about how to care for yourself after your procedure. Your health care provider may also give you more specific instructions. If you have problems or questions, contact your health care provider. What can I expect after the procedure? After the procedure, it is common to have:  Mild pain and tenderness.  Swelling.  Bruising. Follow these instructions at home:  Take over-the-counter or prescription medicines only as told by your health care provider.  Do not take baths, swim, or use a hot tub until your health care provider approves. Ask if you can take a shower or have a sponge bath.  Follow instructions from your health care provider about how to take care of the puncture site. Make sure you:  Wash your hands with soap and water before you change your bandage (dressing). If soap and water are not available, use hand sanitizer.  Change your dressing as told by your health care provider.  Check your puncture siteevery day for signs of infection. Check for:  More redness, swelling, or pain.  More fluid or blood.  Warmth.  Pus or a bad smell.  Return to your normal activities as told by your health care provider. Ask your health care provider what activities are safe for you.  Do not drive for 24 hours if you were given a medicine to help you relax (sedative).  Keep all follow-up visits as told by your health care provider. This is important. Contact a health care provider if:  You have more redness, swelling, or pain around the puncture site.  You have more fluid or blood coming from the puncture site.  Your puncture site feels warm to the touch.  You have pus or a bad smell coming from the puncture site.  You have a fever.  Your pain is not controlled with medicine. This information is not intended to replace advice given to you by your health care provider. Make sure you  discuss any questions you have with your health care provider. Document Released: 07/03/2005 Document Revised: 07/03/2016 Document Reviewed: 05/27/2016 Elsevier Interactive Patient Education  2017 [place].   Moderate Conscious Sedation, Adult, Care After These instructions provide you with information about caring for yourself after your procedure. Your health care provider may also give you more specific instructions. Your treatment has been planned according to current medical practices, but problems sometimes occur. Call your health care provider if you have any problems or questions after your procedure. What can I expect after the procedure? After your procedure, it is common:  To feel sleepy for several hours.  To feel clumsy and have poor balance for several hours.  To have poor judgment for several hours.  To vomit if you eat too soon. Follow these instructions at home: For at least 24 hours after the procedure:   Do not:  Participate in activities where you could fall or become injured.  Drive.  Use heavy machinery.  Drink alcohol.  Take sleeping pills or medicines that cause drowsiness.  Make important decisions or sign legal documents.  Take care of children on your own.  Rest. Eating and drinking  Follow the diet recommended by your health care provider.  If you vomit:  Drink water, juice, or soup when you can drink without vomiting.  Make sure you have little or no nausea before eating solid foods. General instructions  Have a responsible adult stay with you until you are  awake and alert.  Take over-the-counter and prescription medicines only as told by your health care provider.  If you smoke, do not smoke without supervision.  Keep all follow-up visits as told by your health care provider. This is important. Contact a health care provider if:  You keep feeling nauseous or you keep vomiting.  You feel light-headed.  You develop a  rash.  You have a fever. Get help right away if:  You have trouble breathing. This information is not intended to replace advice given to you by your health care provider. Make sure you discuss any questions you have with your health care provider. Document Released: 10/04/2013 Document Revised: 05/18/2016 Document Reviewed: 04/04/2016 Elsevier Interactive Patient Education  2017 [employer].

## 2017-02-02 NOTE — H&P (Signed)
Chief Complaint: Patient was seen in consultation today for bone marrow biopsy at the request of Zhou,Louise  Referring Physician(s): Zhou,Louise  Supervising Physician: Arne Cleveland  Patient Status: Johnny Lucas  History of Present Illness: Johnny Lucas is a 71 y.o. male with hx of breast cancer. Found to have bony lesions. Now is process of workup for multiple myeloma. He underwent bx of left iliac bone lesion a couple weeks ago c/w plasma cell neoplasm. He is now referred for bone marrow biopsy. PMHx, meds, labs, allergies reviewed. He has been NPO this am Family at bedside  Past Medical History:  Diagnosis Date  . Anxiety   . Breast cancer ([place]) 2011   Stave IV breast cancer; radiation and tamoxifen  . GERD (gastroesophageal reflux disease)   . Hypertension   . Macular degeneration     Past Surgical History:  Procedure Laterality Date  . BACK SURGERY    . HERNIA REPAIR    . right arm surgery      Allergies: Patient has no known allergies.  Medications: Prior to Admission medications   Medication Sig Start Date End Date Taking? Authorizing Provider  aspirin EC 81 MG tablet Take by mouth.   Yes Historical Provider, MD  ASTRAGALUS PO Take 200 mLs by mouth.   Yes Historical Provider, MD  Calcium Carb-Cholecalciferol 500-600 MG-UNIT TABS Take 2 tablets by mouth daily.   Yes Historical Provider, MD  calcium carbonate (TUMS - DOSED IN MG ELEMENTAL CALCIUM) 500 MG chewable tablet Chew 2 tablets by mouth daily.   Yes Historical Provider, MD  guaiFENesin (MUCINEX) 600 MG 12 hr tablet Take 600 mg by mouth 2 (two) times daily.   Yes Historical Provider, MD  metoprolol succinate (TOPROL-XL) 100 MG 24 hr tablet  12/25/16  Yes Historical Provider, MD  Multiple Vitamins-Minerals (OCUVITE PO) Take by mouth.   Yes Historical Provider, MD  tamoxifen (NOLVADEX) 20 MG tablet Take 20 mg by mouth daily.   Yes Historical Provider, MD  vitamin B-12 (CYANOCOBALAMIN) 1000 MCG  tablet Take by mouth.   Yes Historical Provider, MD     Family History  Problem Relation Age of Onset  . Stroke Mother   . Cancer Maternal Aunt     cancer NOS; died in her 23s  . Lung cancer Maternal Uncle     smoker    Social History   Social History  . Marital status: Married    Spouse name: N/A  . Number of children: 3  . Years of education: N/A   Social History Main Topics  . Smoking status: Never Smoker  . Smokeless tobacco: Former Systems developer  . Alcohol use 14.4 oz/week    24 Cans of beer per week  . Drug use: No  . Sexual activity: Not Asked   Other Topics Concern  . None   Social History Narrative  . None    Review of Systems: A 12 point ROS discussed and pertinent positives are indicated in the HPI above.  All other systems are negative.  Review of Systems  Vital Signs: BP (!) 148/79   Pulse 61   Temp 98.3 F (36.8 C) (Oral)   Resp 20   SpO2 100%   Physical Exam  Constitutional: He is oriented to person, place, and time. He appears well-developed and well-nourished. No distress.  HENT:  Head: Normocephalic.  Mouth/Throat: Oropharynx is clear and moist.  Neck: Normal range of motion. No JVD present. No tracheal deviation present.  Cardiovascular:  Normal rate, regular rhythm and normal heart sounds.   Pulmonary/Chest: Effort normal and breath sounds normal. No respiratory distress.  Abdominal: Soft. He exhibits no distension. There is no tenderness.  Neurological: He is alert and oriented to person, place, and time.  Skin: Skin is warm and dry.  Psychiatric: He has a normal mood and affect. Judgment normal.    Mallampati Score:  MD Evaluation Airway: WNL Heart: WNL Abdomen: WNL Chest/ Lungs: WNL ASA  Classification: 2 Mallampati/Airway Score: One  Imaging: Ct Biopsy  Result Date: 01/12/2017 INDICATION: History of breast cancer, now with multiple hypermetabolic osseous lesions worrisome for osseous metastatic disease. Please perform CT-guided  bone lesion biopsy for tissue diagnostic purposes. EXAM: CT-GUIDED BONE MARROW BIOPSY AND ASPIRATION MEDICATIONS: PET-CT - 12/31/2016 ANESTHESIA/SEDATION: Fentanyl 150 mcg IV; Versed 4 mg IV Sedation Time: 15 minutes; The patient was continuously monitored during the procedure by the interventional radiology nurse under my direct supervision. COMPLICATIONS: None immediate. PROCEDURE: Informed consent was obtained from the patient following an explanation of the procedure, risks, benefits and alternatives. The patient understands, agrees and consents for the procedure. All questions were addressed. A time out was performed prior to the initiation of the procedure. The patient was positioned prone and non-contrast localization CT was performed of the pelvis to demonstrate the iliac marrow spaces. The operative site was prepped and draped in the usual sterile fashion. Under sterile conditions and local anesthesia, a 22 gauge spinal needle was utilized for procedural planning. Next, an 4 gauge coaxial bone biopsy needle was advanced into location of the ill well-defined area of abnormal hypermetabolic activity within the posterosuperior aspect of the left iliac crest (representative image 11, series 2). Needle position was confirmed with CT imaging. Next, 2 core needle biopsies were obtained with an inner 13 gauge core biopsy device. Appropriate positioning was confirmed and an additional core biopsy was obtained with the 11 gauge outer bone biopsy device. The 11 gauge bone biopsy device was advanced into a slightly different location within the posterosuperior aspect of the left ilium. Appropriate positioning was confirmed and an additional core needle biopsy was obtained at this location. The needle was removed intact. Superficial hemostasis was obtained with compression and a dressing was placed. The patient tolerated the procedure well without immediate post procedural complication. IMPRESSION: Technically  successful CT guided biopsy of poorly well-defined area of abnormal hypermetabolic activity within the posterosuperior aspect of the left ilium. Electronically Signed   By: Ronny Bacon M.D.   On: 01/12/2017 12:25    Labs:  CBC:  Recent Labs  01/12/17 0926 01/18/17 0828 01/28/17 0844 02/02/17 0721  WBC 5.3 5.1 5.3 5.5  HGB 11.4* 11.1* 10.9* 10.5*  HCT 33.5* 32.8* 32.2* 31.4*  PLT 182 158 162 172    COAGS:  Recent Labs  01/12/17 0926 02/02/17 0721  INR 1.13 1.15  APTT 30 30    BMP:  Recent Labs  01/08/17 1029 01/12/17 0926 01/18/17 0828 01/28/17 0844  NA 136 136 136 136  K 4.3 4.0 3.8 4.1  CL 107 107 108 103  CO2 27 23 24 27   GLUCOSE 102* 102* 99 129*  BUN 15 13 13 15   CALCIUM 8.7* 9.0 8.7* 9.1  CREATININE 1.16 1.20 1.11 1.24  GFRNONAA >60 >60 >60 58*  GFRAA >60 >60 >60 >60    LIVER FUNCTION TESTS:  Recent Labs  12/10/16 1215 01/08/17 1029 01/18/17 0828 01/28/17 0844  BILITOT 0.6 0.3 0.6 0.5  AST 27 28 31  34  ALT 20 20 25 30   ALKPHOS 25* 27* 23* 27*  PROT 7.7 8.4* 8.4* 8.5*  ALBUMIN 3.6 3.5 3.5 3.5    TUMOR MARKERS: No results for input(s): AFPTM, CEA, CA199, CHROMGRNA in the last 8760 hours.  Assessment and Plan: Multiple myeloma For CT guided bone marrow biopsy Labs ok Risks and Benefits discussed with the patient including, but not limited to bleeding, infection, damage to adjacent structures or low yield requiring additional tests. All of the patient's questions were answered, patient is agreeable to proceed. Consent signed and in chart.    Thank you for this interesting consult.  I greatly enjoyed meeting Johnny Lucas and look forward to participating in their care.  A copy of this report was sent to the requesting provider on this date.  Electronically Signed: Ascencion Dike 02/02/2017, 8:46 AM   I spent a total of 20 minutes in face to face in clinical consultation, greater than 50% of which was counseling/coordinating care for  bone marrow biopsy

## 2017-02-02 NOTE — Procedures (Signed)
CT-guided LEFT iliac bone marrow aspiration and core biopsy No complication No blood loss. See complete dictation in [facility]  

## 2017-02-08 LAB — BONE MARROW EXAM

## 2017-02-09 ENCOUNTER — Telehealth: Payer: Self-pay | Admitting: Medical Oncology

## 2017-02-09 NOTE — Telephone Encounter (Signed)
faxed cytogenics results to [facility] -[address] Cage.

## 2017-02-10 ENCOUNTER — Other Ambulatory Visit (HOSPITAL_COMMUNITY): Payer: Medicare Other

## 2017-02-10 LAB — TISSUE HYBRIDIZATION (BONE MARROW)-NCBH

## 2017-02-10 LAB — CHROMOSOME ANALYSIS, BONE MARROW

## 2017-02-11 ENCOUNTER — Encounter (HOSPITAL_BASED_OUTPATIENT_CLINIC_OR_DEPARTMENT_OTHER): Payer: Medicare Other

## 2017-02-11 ENCOUNTER — Ambulatory Visit (HOSPITAL_COMMUNITY): Payer: Medicare Other

## 2017-02-11 ENCOUNTER — Other Ambulatory Visit (HOSPITAL_COMMUNITY): Payer: Medicare Other

## 2017-02-11 ENCOUNTER — Encounter (HOSPITAL_COMMUNITY): Payer: Medicare Other

## 2017-02-11 VITALS — BP 139/78 | HR 69 | Temp 98.7°F | Resp 18

## 2017-02-11 DIAGNOSIS — C50922 Malignant neoplasm of unspecified site of left male breast: Secondary | ICD-10-CM | POA: Diagnosis not present

## 2017-02-11 DIAGNOSIS — C7951 Secondary malignant neoplasm of bone: Secondary | ICD-10-CM | POA: Diagnosis present

## 2017-02-11 DIAGNOSIS — C50122 Malignant neoplasm of central portion of left male breast: Secondary | ICD-10-CM | POA: Diagnosis not present

## 2017-02-11 DIAGNOSIS — Z170 Estrogen receptor positive status [ER+]: Secondary | ICD-10-CM | POA: Diagnosis not present

## 2017-02-11 LAB — COMPREHENSIVE METABOLIC PANEL
ALBUMIN: 3.4 g/dL — AB (ref 3.5–5.0)
ALK PHOS: 28 U/L — AB (ref 38–126)
ALT: 19 U/L (ref 17–63)
ANION GAP: 3 — AB (ref 5–15)
AST: 23 U/L (ref 15–41)
BILIRUBIN TOTAL: 0.4 mg/dL (ref 0.3–1.2)
BUN: 16 mg/dL (ref 6–20)
CALCIUM: 8.5 mg/dL — AB (ref 8.9–10.3)
CO2: 24 mmol/L (ref 22–32)
CREATININE: 1.15 mg/dL (ref 0.61–1.24)
Chloride: 107 mmol/L (ref 101–111)
GFR calc Af Amer: >60 mL/min (ref >60)
GFR calc non Af Amer: >60 mL/min (ref >60)
GLUCOSE: 97 mg/dL (ref 65–99)
Potassium: 4.5 mmol/L (ref 3.5–5.1)
Sodium: 134 mmol/L — ABNORMAL LOW (ref 135–145)
TOTAL PROTEIN: 8.9 g/dL — AB (ref 6.5–8.1)

## 2017-02-11 LAB — CBC WITH DIFFERENTIAL/PLATELET
BASOS PCT: 0 %
Basophils Absolute: 0 10*3/uL (ref 0.0–0.1)
Eosinophils Absolute: 0.2 10*3/uL (ref 0.0–0.7)
Eosinophils Relative: 4 %
HEMATOCRIT: 29.8 % — AB (ref 39.0–52.0)
HEMOGLOBIN: 10 g/dL — AB (ref 13.0–17.0)
Lymphocytes Relative: 40 %
Lymphs Abs: 2.3 10*3/uL (ref 0.7–4.0)
MCH: 32.2 pg (ref 26.0–34.0)
MCHC: 33.6 g/dL (ref 30.0–36.0)
MCV: 95.8 fL (ref 78.0–100.0)
MONOS PCT: 11 %
Monocytes Absolute: 0.6 10*3/uL (ref 0.1–1.0)
NEUTROS ABS: 2.6 10*3/uL (ref 1.7–7.7)
NEUTROS PCT: 45 %
Platelets: 180 10*3/uL (ref 150–400)
RBC: 3.11 MIL/uL — ABNORMAL LOW (ref 4.22–5.81)
RDW: 13.3 % (ref 11.5–15.5)
WBC: 5.8 10*3/uL (ref 4.0–10.5)

## 2017-02-11 MED ORDER — DENOSUMAB 120 MG/1.7ML ~~LOC~~ SOLN
120.0000 mg | Freq: Once | SUBCUTANEOUS | Status: AC
  Administered 2017-02-11: 120 mg via SUBCUTANEOUS
  Filled 2017-02-11: qty 1.7

## 2017-02-11 NOTE — Patient Instructions (Signed)
[facility] Discharge Instructions  RECOMMENDATIONS MADE BY THE CONSULTANT AND ANY TEST RESULTS WILL BE SENT TO YOUR REFERRING PHYSICIAN.  Xgeva injection today. Return as scheduled.   Thank you for choosing  [facility] Center at [facility] to provide your oncology and hematology care.  To afford each patient quality time with our provider, please arrive at least 15 minutes before your scheduled appointment time.    If you have a lab appointment with the Cancer Center please come in thru the  Main Entrance and check in at the main information desk  You need to re-schedule your appointment should you arrive 10 or more minutes late.  We strive to give you quality time with our providers, and arriving late affects you and other patients whose appointments are after yours.  Also, if you no show three or more times for appointments you may be dismissed from the clinic at the providers discretion.     Again, thank you for choosing [facility].  Our hope is that these requests will decrease the amount of time that you wait before being seen by our physicians.       _____________________________________________________________  Should you have questions after your visit to  Cancer Center, please contact our office at [phone] between the hours of 8:30 a.m. and 4:30 p.m.  Voicemails left after 4:30 p.m. will not be returned until the following business day.  For prescription refill requests, have your pharmacy contact our office.       Resources For Cancer Patients and their Caregivers ? American Cancer Society: Can assist with transportation, wigs, general needs, runs Look Good Feel Better.        1-888-227-6333 ? Cancer Care: Provides financial assistance, online support groups, medication/co-pay assistance.  1-800-813-HOPE (4673) ? [facility] Assists [place] cancer patients and  their families through emotional , educational and financial support.  336-427-4357 ? [place] DSS Where to apply for food stamps, Medicaid and utility assistance. 336-342-1394 ? [facility]: Transportation to medical appointments. 336-347-2287 ? Social Security Administration: May apply for disability if have a Stage IV cancer. 336-342-7796 1-800-772-1213 ? [place] Aging, Disability and Transit Services: Assists with nutrition, care and transit needs. 336-349-2343  Cancer Center Support Programs: @10RELATIVEDAYS@ > Cancer Support Group  2nd Tuesday of the month 1pm-2pm, Journey Room  > Creative Journey  3rd Tuesday of the month 1130am-1pm, Journey Room  > Look Good Feel Better  1st Wednesday of the month 10am-12 noon, Journey Room (Call American Cancer Society to register 1-800-395-5775)   

## 2017-02-11 NOTE — Progress Notes (Signed)
Corrected calcium 8.98.  KEMO SPRUCE presents today for injection per the provider's orders.  Xgeva administration without incident; see MAR for injection details.  Patient tolerated procedure well and without incident.  No questions or complaints noted at this time.

## 2017-02-15 ENCOUNTER — Encounter (HOSPITAL_COMMUNITY): Payer: Self-pay

## 2017-02-18 ENCOUNTER — Encounter (HOSPITAL_BASED_OUTPATIENT_CLINIC_OR_DEPARTMENT_OTHER): Payer: Medicare Other | Admitting: Oncology

## 2017-02-18 ENCOUNTER — Encounter (HOSPITAL_COMMUNITY): Payer: Self-pay

## 2017-02-18 VITALS — BP 125/71 | HR 98 | Temp 98.5°F | Resp 18 | Wt 212.6 lb

## 2017-02-18 DIAGNOSIS — C50122 Malignant neoplasm of central portion of left male breast: Secondary | ICD-10-CM

## 2017-02-18 DIAGNOSIS — C7951 Secondary malignant neoplasm of bone: Secondary | ICD-10-CM | POA: Diagnosis not present

## 2017-02-18 DIAGNOSIS — C9000 Multiple myeloma not having achieved remission: Secondary | ICD-10-CM

## 2017-02-18 HISTORY — DX: Multiple myeloma not having achieved remission: C90.00

## 2017-02-18 MED ORDER — ONDANSETRON HCL 8 MG PO TABS: 8.0000 mg | ORAL_TABLET | Freq: Two times a day (BID) | ORAL | 1 refills | Status: DC | PRN

## 2017-02-18 MED ORDER — ALPRAZOLAM 0.5 MG PO TABS: 0.5000 mg | ORAL_TABLET | Freq: Every evening | ORAL | 1 refills | Status: DC | PRN

## 2017-02-18 MED ORDER — DEXAMETHASONE 4 MG PO TABS: ORAL_TABLET | ORAL | 3 refills | Status: DC

## 2017-02-18 MED ORDER — LORAZEPAM 0.5 MG PO TABS: 0.5000 mg | ORAL_TABLET | Freq: Four times a day (QID) | ORAL | 0 refills | Status: DC | PRN

## 2017-02-18 MED ORDER — LIDOCAINE-PRILOCAINE 2.5-2.5 % EX CREA: TOPICAL_CREAM | CUTANEOUS | 3 refills | Status: DC

## 2017-02-18 MED ORDER — LENALIDOMIDE 25 MG PO CAPS
ORAL_CAPSULE | ORAL | 3 refills | Status: DC
Start: 2017-02-18 — End: 2017-02-19

## 2017-02-18 MED ORDER — PROCHLORPERAZINE MALEATE 10 MG PO TABS: 10.0000 mg | ORAL_TABLET | Freq: Four times a day (QID) | ORAL | 1 refills | Status: DC | PRN

## 2017-02-18 MED ORDER — ACYCLOVIR 400 MG PO TABS: 400.0000 mg | ORAL_TABLET | Freq: Two times a day (BID) | ORAL | 3 refills | Status: DC

## 2017-02-18 NOTE — Progress Notes (Signed)
[name]  PROGRESS NOTE  Patient Care Team: Johnny Percy, MD as PCP - General (Family Medicine)  CHIEF COMPLAINTS/PURPOSE OF CONSULTATION:    Breast cancer, male (Johnny Lucas)   12/31/2009 Initial Biopsy    Biopsy of L breast       12/31/2009 Pathology Results    Invasive ductal carcinoma, ER/PR+, HER 2 negative      12/31/2009 Imaging    Ultrasound showing a 2.43 x 1.85 x 3 cm hypoechoic spiculated mass in the 12 o clock L breast retroareolar region      01/01/2010 -  Anti-estrogen oral therapy    Tamoxifen 20 mg daily      01/06/2010 Imaging    Bone scan abnormal uptake in the diaphysis of the R humerus, abnormal in the R third, fifth and sixth ribs, lesion also noted in the sternum.      02/03/2010 Surgery    Rod placement and fixation of R humerus by Dr. Amedeo Lucas      02/05/2010 - 02/18/2010 Radiation Therapy    30Gy in 10 fractions of 3 Gy per fraction to R pathologic fracture      03/11/2010 -  Chemotherapy    Denosumab monthly, now every 3 months. Started at [facility]       06/09/2016 Imaging    Three hypermetabolic osseous lesions in the sternum, left ilium and right ilium, as discussed above, likely represent osseous metastases. At this time, these are not recognizable on the CT images. 2. No extra skeletal metastatic disease identified in the neck, chest, abdomen or pelvis.      10/13/2016 Progression    PET shows various new and enlarging osseous metastatic lesions with no definite extra osseous metastatic disease currently identified.       12/31/2016 Progression    1. Multifocal hypermetabolic osseous metastases throughout the axial and proximal appendicular skeleton, which are increased in size, number and metabolism since 10/13/2016 PET-CT. 2. 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 3. No additional sites of hypermetabolic metastatic  disease. 4. Chronic right mastoid sinusitis. 5. Aortic atherosclerosis.  One vessel coronary atherosclerosis.       HISTORY OF PRESENTING ILLNESS:  Johnny Lucas 71 y.o. male is here for ongoing follow-up of breast cancer and multiple myeloma.  He was diagnosed with breast cancer in 2011. He never had a mastectomy or radiation, but did have a lumpectomy and has been on Tamoxifen. He also had to "have a nail" put in his right arm. Johnny Lucas said his arm was never biopsied, but an x-ray "revealed a black spot." He had radiation. He does have regular mammograms.  Patient has metastatic bone disease which was initially presumed to be metastatic disease from his breast cancer, however he was noted to have a IgG kappa monoclonal paraprotein.  Multiple myeloma work up on Jan 18 2017, demonstrated a SPEP with a monoclonal spike in the gamma region, 2.3 g/dL. Immunofixation showed an IgG monoclonal protein with kappa  light chains specificity with monoclonal light chains (Bence Jones protein) Free kappa light were 2,129, free gamma light 7.8, kappa, gamma light ratio was 272.96. Beta 2 micro-globulin was 3.3. CMP performed today showed creatine 1.24, total protein 8.5 calcium level 9.1. Serum immunoglobulin taken Janurary 22,18 serum IgG 3225, serum IgA  97, serum IgM is 25.  Bone marrow biopsy and aspirate 02/02/2017 demonstrating multiple myeloma. The marrow was variably cellular  with large peritrabecular aggregates of kappa restricted plasma cells (66% by aspirate, 30% by Cd138). Cytogenetics +11.  Johnny Lucas presents to the clinic today accompanied by his wife and 3 sons for review of bone marrow biopsy results. He has no new complaints today. He did not experience any complications post bone marrow biopsy.       MEDICAL HISTORY:  Past Medical History:  Diagnosis Date  . Anxiety   . Breast cancer ([place]) 2011   Stave IV breast cancer; radiation and tamoxifen  . GERD (gastroesophageal reflux  disease)   . Hypertension   . Macular degeneration     SURGICAL HISTORY: Past Surgical History:  Procedure Laterality Date  . BACK SURGERY    . HERNIA REPAIR    . right arm surgery      SOCIAL HISTORY: Social History   Social History  . Marital status: Married    Spouse name: N/A  . Number of children: 3  . Years of education: N/A   Occupational History  . Not on file.   Social History Main Topics  . Smoking status: Never Smoker  . Smokeless tobacco: Former Systems developer  . Alcohol use 14.4 oz/week    24 Cans of beer per week  . Drug use: No  . Sexual activity: Not on file   Other Topics Concern  . Not on file   Social History Narrative  . No narrative on file  Married for 50 years.  He has 3 children and 5 grandchildren.  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 He has never smoked. He drinks beer.   FAMILY HISTORY: Family History  Problem Relation Age of Onset  . Stroke Mother   . Cancer Maternal Aunt     cancer NOS; died in her 6s  . Lung cancer Maternal Uncle     smoker  Both parents deceased at 36 and 26. Mom died of stroke and dad of old age.   ALLERGIES:  has No Known Allergies.  MEDICATIONS:  Current Outpatient Prescriptions  Medication Sig Dispense Refill  . aspirin EC 81 MG tablet Take by mouth.    . ASTRAGALUS PO Take 200 mLs by mouth.    . Calcium Carb-Cholecalciferol 500-600 MG-UNIT TABS Take 2 tablets by mouth daily.    . calcium carbonate (TUMS - DOSED IN MG ELEMENTAL CALCIUM) 500 MG chewable tablet Chew 2 tablets by mouth daily.    Marland Kitchen guaiFENesin (MUCINEX) 600 MG 12 hr tablet Take 600 mg by mouth 2 (two) times daily.    . metoprolol succinate (TOPROL-XL) 100 MG 24 hr tablet     . Multiple Vitamins-Minerals (OCUVITE PO) Take by mouth.    . tamoxifen (NOLVADEX) 20 MG tablet Take 20 mg by mouth daily.    . vitamin B-12 (CYANOCOBALAMIN) 1000 MCG tablet Take by mouth.     No current facility-administered  medications for this visit.     Review of Systems  Constitutional: Negative.  Negative for chills and fever.  HENT: Negative.  Negative for hearing loss, sore throat and tinnitus.   Eyes: Negative.  Negative for blurred vision, photophobia and discharge.  Respiratory: Negative.  Negative for cough, hemoptysis, shortness of breath and wheezing.   Cardiovascular: Negative.  Negative for chest pain, palpitations, orthopnea, claudication and leg swelling.  Gastrointestinal: Negative.  Negative for abdominal pain, constipation, diarrhea, melena, nausea and vomiting.  Genitourinary: Negative.  Negative for dysuria and hematuria.  Musculoskeletal: Negative.  Negative for back pain, joint pain and myalgias.  Skin: Negative.  Negative for itching and rash.  Neurological: Negative.  Negative for dizziness, weakness and headaches.  Endo/Heme/Allergies: Negative.  Negative for environmental allergies and polydipsia. Does not bruise/bleed easily.  Psychiatric/Behavioral: Negative.  Negative for depression. The patient is not nervous/anxious and does not have insomnia.   All other systems reviewed and are negative. 14 point ROS was done and is otherwise as detailed above or in HPI   PHYSICAL EXAMINATION: ECOG PERFORMANCE STATUS: 0 - Asymptomatic  Vitals:   02/18/17 1514  BP: 125/71  Pulse: 98  Resp: 18  Temp: 98.5 F (36.9 C)    Physical Exam  Constitutional: He is oriented to person, place, and time and well-developed, well-nourished, and in no distress. No distress.  HENT:  Head: Normocephalic and atraumatic.  Nose: Nose normal.  Mouth/Throat: Oropharynx is clear and moist. No oropharyngeal exudate.  Eyes: Conjunctivae and EOM are normal. Pupils are equal, round, and reactive to light. Right eye exhibits no discharge. Left eye exhibits no discharge. No scleral icterus.  Neck: Normal range of motion. Neck supple. No JVD present. No tracheal deviation present. No thyromegaly present.    Cardiovascular: Normal rate, regular rhythm and normal heart sounds.  Exam reveals no gallop and no friction rub.   No murmur heard. Pulmonary/Chest: Effort normal and breath sounds normal. No respiratory distress. He has no wheezes. He has no rales.  Abdominal: Soft. Bowel sounds are normal. He exhibits no distension and no mass. There is no tenderness. There is no rebound and no guarding.  Musculoskeletal: Normal range of motion. He exhibits no edema or tenderness.  Lymphadenopathy:    He has no cervical adenopathy.  Neurological: He is alert and oriented to person, place, and time. He has normal reflexes. No cranial nerve deficit. Gait normal. Coordination normal.  Skin: Skin is warm and dry. No rash noted. No erythema. No pallor.  Psychiatric: Mood, memory, affect and judgment normal.  Nursing note and vitals reviewed.   LABORATORY DATA:  I have reviewed the data as listed Lab Results  Component Value Date   WBC 5.8 02/11/2017   HGB 10.0 (L) 02/11/2017   HCT 29.8 (L) 02/11/2017   MCV 95.8 02/11/2017   PLT 180 02/11/2017   CMP     Component Value Date/Time   NA 134 (L) 02/11/2017 1253   K 4.5 02/11/2017 1253   CL 107 02/11/2017 1253   CO2 24 02/11/2017 1253   GLUCOSE 97 02/11/2017 1253   BUN 16 02/11/2017 1253   CREATININE 1.15 02/11/2017 1253   CALCIUM 8.5 (L) 02/11/2017 1253   PROT 8.9 (H) 02/11/2017 1253   ALBUMIN 3.4 (L) 02/11/2017 1253   AST 23 02/11/2017 1253   ALT 19 02/11/2017 1253   ALKPHOS 28 (L) 02/11/2017 1253   BILITOT 0.4 02/11/2017 1253   GFRNONAA >60 02/11/2017 1253   GFRAA >60 02/11/2017 1253    RADIOGRAPHIC STUDIES: I have personally reviewed the radiological images as listed and agreed with the findings in the report. No results found.  Study Results  CT-GUIDED BONE MARROW BIOPSY AND ASPIRATION 01/12/2017  IMPRESSION: Technically successful CT guided biopsy of poorly well-defined area of abnormal hypermetabolic activity within the  posterosuperior aspect of the left ilium.   CT GUIDED DEEP ILIAC BONE ASPIRATION AND CORE BIOPSY 02/02/2017  IMPRESSION: 1. Technically successful CT guided left iliac bone core and aspiration biopsy.  PATHOLOGY:     ASSESSMENT & PLAN:  Stage IV  infiltrating ductal carcinoma L breast ER+ HER 2 - on tamoxifen. ISS Stage II IgG kappa multiple myeloma (66% plasma cells in bone marrow) with bone metastasis  PLAN: I have reviewed bone marrow biopsy results in detail with the patient and his family in detail. Discussed starting treatment with revlimid, velcade, dexamethasone (RVD) q21 days. Administration, dosage, and side effects reviewed in detail with the patient and his family. I will have our nurse navigator go over treatment plan with him in detail again. Start xgeva q4weeks for bone mets. RTC in 4 weeks for followup with repeat CBC, CMP, SPEP, serum free light chains, beta2 microglobulin, serum immunoglobulins. Total of 40 min spent face to face with patient and family in discussion of his disease and plan of care.    This document serves as a record of services personally performed by Twana First, MD. It was created on her behalf by Shirlean Mylar, a trained medical scribe. The creation of this record is based on the scribe's personal observations and the provider's statements to them. This document has been checked and approved by the attending provider.  I have reviewed the above documentation for accuracy and completeness, and I agree with the above.  This note was electronically signed.    [employer]  02/18/2017 10:18 AM

## 2017-02-18 NOTE — Patient Instructions (Signed)
[place] at [facility] Discharge Instructions  RECOMMENDATIONS MADE BY THE CONSULTANT AND ANY TEST RESULTS WILL BE SENT TO YOUR REFERRING PHYSICIAN.  You were seen today by Dr. Twana First Follow up in 4 weeks with lab work We refilled your xanax Shellia Carwin, RN will meet with you today to talk about new treatment See Amy up front for appointments   Thank you for choosing [place] at [facility] to provide your oncology and hematology care.  To afford each patient quality time with our provider, please arrive at least 15 minutes before your scheduled appointment time.    If you have a lab appointment with the [facility] please come in thru the  Main Entrance and check in at the main information desk  You need to re-schedule your appointment should you arrive 10 or more minutes late.  We strive to give you quality time with our providers, and arriving late affects you and other patients whose appointments are after yours.  Also, if you no show three or more times for appointments you may be dismissed from the clinic at the providers discretion.     Again, thank you for choosing [facility].  Our hope is that these requests will decrease the amount of time that you wait before being seen by our physicians.       _____________________________________________________________  Should you have questions after your visit to [facility], please contact our office at [phone] between the hours of 8:30 a.m. and 4:30 p.m.  Voicemails left after 4:30 p.m. will not be returned until the following business day.  For prescription refill requests, have your pharmacy contact our office.       Resources For Cancer Patients and their Caregivers ? American Cancer Society: Can assist with transportation, wigs, general needs, runs Look Good Feel Better.        609-068-4426 ? Cancer Care: Provides financial  assistance, online support groups, medication/co-pay assistance.  1-800-813-HOPE (905) 017-6874) ? [employer] Assists [place] cancer patients and their families through emotional , educational and financial support.  346-269-5295 ? [place] DSS Where to apply for food stamps, Medicaid and utility assistance. (506)002-1365 ? [facility]: Transportation to medical appointments. (865)325-7373 ? Social Security Administration: May apply for disability if have a Stage IV cancer. 217-311-5135 863-615-1516 ? [employer], Disability and Transit Services: Assists with nutrition, care and transit needs. [facility] Support Programs: @10RELATIVEDAYS @ > Cancer Support Group  2nd Tuesday of the month 1pm-2pm, Journey Room  > Creative Journey  3rd Tuesday of the month 1130am-1pm, Journey Room  > Look Good Feel Better  1st Wednesday of the month 10am-12 noon, Journey Room (Call [place] to register [phone])

## 2017-02-18 NOTE — Progress Notes (Signed)
Met with pt during follow up appt.  Chemo information given on RVD and chemo schedule explained.  Pt is to call me when he has received his revlimid and we are going to start him on a Monday.  This will be easier for him to follow his schedule.  We will start RVD all together.   

## 2017-02-19 ENCOUNTER — Other Ambulatory Visit (HOSPITAL_COMMUNITY): Payer: Self-pay | Admitting: Oncology

## 2017-02-19 DIAGNOSIS — C9000 Multiple myeloma not having achieved remission: Secondary | ICD-10-CM

## 2017-02-19 MED ORDER — LENALIDOMIDE 25 MG PO CAPS: ORAL_CAPSULE | ORAL | 3 refills | Status: DC

## 2017-02-22 ENCOUNTER — Telehealth (HOSPITAL_COMMUNITY): Payer: Self-pay | Admitting: Emergency Medicine

## 2017-02-22 NOTE — Telephone Encounter (Signed)
Pt wanted to verify when he would start chemo.  I explained that when he received his chemo pill that he needs to call me and we would get his RVD set up for a Monday.  He verbalized understanding.

## 2017-02-23 ENCOUNTER — Encounter (HOSPITAL_COMMUNITY): Payer: Self-pay | Admitting: Emergency Medicine

## 2017-02-23 NOTE — Patient Instructions (Signed)
[facility]   CHEMOTHERAPY INSTRUCTIONS  You have been diagnosed with multiple myeloma.  We are going to treat you with velcade, dexamethasone, and revlimid.  You will take revlimid days 1-14 every 21 days.  So 2 weeks on and 1 week off.  Dexamethasone you will take weekly with the velcade.  You will see the doctor regularly throughout treatment.  We monitor your lab work prior to every treatment.  The doctor monitors your response to treatment by the way you are feeling, your blood work, and scans periodically.   POTENTIAL SIDE EFFECTS OF TREATMENT:  Bortezomib (Velcade) . About This Drug Bortezomib is used to treat cancer. It is given in the vein (IV) or by a shot under the skin (subcutaneously)  Possible Side Effects . Bone marrow depression. Decrease in the number of white blood cells, red blood cells, and platelets. This may raise your risk of infection, make you tired and weak (fatigue), and raise your risk of bleeding. . Nausea and vomiting (throwing up) . Decreased appetite (decreased hunger) . Constipation (not able to move bowels) . Loose bowel movements (diarrhea) . Neuropathy. Effects on the nerves are called peripheral neuropathy. You may feel numbness, tingling, or pain in your hands and feet. It may be hard for you to button your clothes, open jars, or walk as usual. The effect on the nerves may get worse with more doses of the drug. These effects get better in some people after the drug is stopped but it does not get better in all people. . Tiredness . Rash . Fever Note: Each of the side effects above was reported in 20% or greater of patients treated with bortezomib. Not all possible side effects are included above.  Warnings and Precautions . Low blood pressure . Congestive heart failure. You may be short of breath. Your arms, hands, legs and feet may swell. . Inflammation (swelling) of the lungs. You may have a dry cough or  trouble breathing. . A partial or complete blockage of your small and/or large intestine. . Changes in your central nervous system can happen. The central nervous system is made up of your brain and spinal cord. You could feel extreme tiredness, agitation, confusion, hallucinations (see or hear things that are not there), trouble understanding or speaking, loss of control of your bowels or bladder, eyesight changes, numbness or lack of strength to your arms, legs, face, or body, and coma. If you start to have any of these symptoms let your doctor know right away. . Tumor lysis: This drug may act on the cancer cells very quickly. This may affect how your kidneys work. . Changes in your liver function  Important Information . This drug may be present in the saliva, tears, sweat, urine, stool, vomit, semen, and vaginal secretions. Talk to your doctor and/or your nurse about the necessary precautions to take during this time. . This drug may impair your ability to drive or use machinery. Use caution and tell your nurse or doctor if you feel dizzy, very sleepy, and/or experience low blood pressure.  Treating Side Effects . Manage tiredness by pacing your activities for the day. . Be sure to include periods of rest between energy-draining activities. Reyes Ivan your hands regularly. . Avoid close contact with people who have a cold, the flu, or other infections. . Use a soft toothbrush. Check with your nurse before using dental floss. . Be very careful when using knives or tools. . Use an  electric shaver instead of a razor. . Ask your doctor or nurse about medicines that are available to help stop or lessen constipation. . If you are not able to move your bowels, check with your doctor or nurse before you use enemas, laxatives, or suppositories. . Drink plenty of fluids (a minimum of eight glasses per day is recommended). . If you throw up or have loose bowel movements, you should drink more  fluids so that you do not become dehydrated (lack water in the body from losing too much fluid). . If you get diarrhea, eat low-fiber foods that are high in protein and calories and avoid foods that can irritate your digestive tracts or lead to cramping. . Ask your nurse or doctor about medicine that can lessen or stop your diarrhea. . To help with nausea and vomiting, eat small, frequent meals instead of three large meals a day. Choose foods and drinks that are at room temperature. Ask your nurse or doctor about other helpful tips and medicine that is available to help or stop lessen these symptoms. . To help with decreased appetite, eat small, frequent meals. . Eat high caloric food such as pudding, ice cream, yogurt and milkshakes. . If you have numbness and tingling in your hands and feet, be careful when cooking, walking, and handling sharp objects and hot liquids. . If you get a rash do not put anything on it unless your doctor or nurse says you may. Keep the area around the rash clean and dry. Ask your doctor for medicine if your rash bothers you. . Signs of tumor lysis: Confusion or agitation, decreased urine, nausea/vomiting, diarrhea, muscle cramping, numbness and/or tingling, seizures.  Food and Drug Interactions . There are no known interactions of bortezomib with food. . Check with your doctor or pharmacist about all other prescription medicines and dietary supplements you are taking before starting this medicine as there are a lot of known drug interactions with bortezemib. Also, check with your doctor or pharmacist before starting any new prescription or over-the-counter medicines, or dietary supplement to make sure that there are no interactions. . Avoid the use of St. John's Wort with bortezomib as this may lower the levels of the drug in your body, which can make it less effective.  When to Call the Doctor Call your doctor or nurse if you have any of these symptoms  and/or any new or unusual symptoms: . Fever of 100.5 F (38 C) or higher . Chills . Fatigue that interferes with your daily activities . Feeling dizzy or lightheaded . Feeling that your heart is beating in a fast or not normal way (palpitations) . Trouble breathing . Swelling of legs, ankles, or feet . Weight gain of 5 pounds in one week (fluid retention) . Easy bleeding or bruising . Confusion and/or agitation . Hallucinations . Trouble understanding or speaking . Blurry vision or changes in your eyesight . Numbness or lack of strength to your arms, legs, face, or body . No bowel movement in 3 days or when you feel uncomfortable. . Abdominal pain that does not go away . Loose bowel movements (diarrhea) 4 times a day or loose bowel movements with lack of strength or a feeling of being dizzy . Nausea that stops you from eating or drinking and/or is not relieved by prescribed medicines . Throwing up more than 3 times a day . Numbness, tingling, or pain your hands and feet . New rash and/or itching . Rash that is not relieved  by prescribed medicines . Lasting loss of appetite or rapid weight loss of five pounds in a week . Signs of tumor lysis: Confusion or agitation, decreased urine, nausea/vomiting, diarrhea, muscle cramping, numbness and/or tingling, seizures. . Signs of possible liver problems: dark urine, pale bowel movements, bad stomach pain, feeling very tired and weak, unusual itching, or yellowing of the eyes or skin . If you think you are pregnant or may have impregnated your partner  Reproduction Warnings . Pregnancy warning: This drug can have harmful effects on the unborn baby. Women of child bearing potential should use effective methods of birth control during your cancer treatment and for at least 2 months after treatment. Men with male partners of child bearing potential should use effective methods of birth control during your cancer treatment and for at least 2  months after your cancer treatment. Let your doctor know right away if you think you may be pregnant or may have impregnated your partner. . Breastfeeding warning: Women should not breast feed during treatment and for at least 2 months month after treatment because this drug could enter the breast milk and cause harm to a breast feeding baby. . Fertility warning: In men and women both, this drug may affect your ability to have children in the future. Talk with your doctor or nurse if you plan to have children. Ask for information on sperm or egg banking.   Dexamethasone (Generic Name) Other Names: Decadron  About This Drug Dexamethasone is used to treat cancer. This drug can be given in the vein (IV), by mouth, or as an eye drop.  Possible Side Effects (More Common) . Increased appetite (increased hunger) and weight gain . Skin and tissue irritation may involve pain, redness, or swelling at the site of the IV injection. Marland Kitchen Headache . Increase growth of facial hair . Muscle weakness that interferes with your daily activities . Increase in sweating . Aggravation of stomach ulcers; increase in stomach pain or burning . Swelling of hands, feet, face, or trunk . Changes in mood, which may include depression or a feeling of extreme well-being . High blood sugar. Your blood glucose level may be checked as needed. . Impaired or slower wound healing . Increased risk of infection . High blood pressure. Your doctor will check your blood pressure as needed. . Blurred vision (especially if using the eye drops) . Feeling dizzy . Feeling restless, nervous, or irritable . Trouble sleeping, nightmares  Possible Side Effects (Less Common) . Low platelet count. This can raise your risk of bleeding. You may also have bruising. . Nausea and throwing up (vomiting) . Loose bowel movements (diarrhea) . Rapid heartbeat . Blood clots. A blood clot in your leg may cause your leg to swell, appear red  or warm, and/or cause pain. A blood clot in your lungs may cause trouble breathing, and/or chest pain. Marland Kitchen Urinating more often or in greater amounts . Seizures . Changes in liver enzymes. Your doctor will check your liver function as needed. . Signs of liver problems: dark urine, pale bowel movements, bad stomach pain, feeling very tired or weak, unusual itching, or yellowing of the eyes or skin.  Reproduction Concerns . Pregnancy warning: It is not known if this drug may harm an unborn child. For this reason, be sure to talk with your doctor if you are pregnant or plan to become pregnant while getting this drug. . Breast feeding warning: It is not known if this drug passes into breast milk. For  this reason, women should talk to their doctor about the risks and benefits of breast feeding during treatment because this drug may enter the breast milk and badly harm a breast feeding baby. Infertility Sexual problems and reproduction concerns may happen. In both men and women, this drug may affect your ability to have children. This cannot be determined before your therapy. Talk with your doctor or nurse if you plan to have children. Ask for information on sperm or egg banking. In men, this drug may interfere with your ability to make sperm, but it should not change your ability to have sexual relations. In women, menstrual bleeding may become irregular or stop while you are getting this drug. Do not assume that you cannot become pregnant if you do not have a menstrual period. Women may go through signs of menopause (change of life) like vaginal dryness or itching. Vaginal lubricants can be used to lessen vaginal dryness, itching, and pain during sexual relations. Genetic counseling is available for you to talk about the effects of this drug therapy on future pregnancies. Also, a genetic counselor can look at the possible risk of problems in the unborn baby due to this medicine if an exposure  happens during pregnancy.  Treating Side Effects . Drink 6-8 cups of fluids every day unless your doctor has told you to limit your fluid intake due to some other health problem. A cup is 8 ounces of fluid. If you throw up or have loose bowel movements, you should drink more fluids so that you do not become dehydrated (lack water in the body from losing too much fluid). . Ask your doctor or nurse about medicine that is available to stop or lessen nausea, throwing up, loose bowel movements, headache, stomach pain or burning. . Do not put anything on a rash unless your doctor or nurse says you may. Keep the area around the rash clean and dry. Ask your doctor for medicine if your rash bothers you. . While getting this medicine in your vein (IV infusion), tell your nurse right away if you have pain, redness, or swelling at the site of the IV infusion. . If you are dizzy, get up slowly after sitting or lying down. . Talk with your doctor or nurse if you feel you need help with changes in your moods. . Wear dark sun glasses when in the sun or bright lights.  Food and Drug Interactions There are no known interactions of dexamethasone with food. This drug may interact with other medicines. Tell your doctor and pharmacist about all the medicines and dietary supplements (vitamins, minerals, herbs and others) that you are taking at this time. The safety and use of dietary supplements and alternative diets are often not known. Using these might affect your cancer or interfere with your treatment. Until more is known, you should not use dietary supplements or alternative diets without your cancer doctor's advice.  Important Information (Oral Medicine Instructions) Take this medicine with or without food. If you have stomach irritation or pain, take it with food. If you miss a dose, take it as soon as you remember. Do not take it if it is close to your next dose. Just take the dose at your normal time.  Do not take more than 1 dose at a time. If you are using the eye drop form of this medicine, be sure to remove contact lenses and wash your hands before putting the drops in your eyes.  Allergic Reactions Serious allergic reactions  including anaphylaxis are rare. While you are getting this drug in your vein (IV), tell your nurse right away if you have any of these symptoms of an allergic reaction: . Trouble catching your breath . Feeling like your tongue or throat are swelling . Feeling your heart beat quickly or in a not normal way (palpitations) . Feeling dizzy or lightheaded . Flushing, itching, rash, and/or hives  When to Call the Doctor Call your doctor or nurse right away if you have any of these symptoms: . Temperature of 100.5 F (38 C) or above . Chills . Wheezing or trouble breathing . Rash or itching . Feeling dizzy or lightheaded . Feeling that your heart is beating in a fast or not normal way (palpitations) . Bleeding or bruising that is not usual . Nausea that stops you from eating or drinking . Loose bowel movements (diarrhea) more than 4 times a day or loose bowel movements with weakness or feeling lightheaded. . Throwing up more than three times in one day . Feeling dizzy . Feeling confused, agitated, or if you see, hear, or feel things that are not there (hallucinations) . Seizures . Throwing up blood, or fluid that looks like coffee grounds . Blood in your bowel movements Call your doctor or nurse as soon as possible if you have any of these symptoms: . Extreme weakness that interferes with normal activities . Nausea not relieved by prescribed medicines . Pain in arms or legs not relieved by prescribed medicine . Headache not relieved by prescribed medicine . Yellowing of skin or eyes . Unusual thirst or passing urine often . Blurred vision or other changes in eyesight . Stomach or abdominal pain or burning . Rash that is not relieved by prescribed  medicines . Swelling of legs, ankles or feet . Heartburn or indigestion . Weight gain of five pounds in one week (fluid retention)   Lenalidomide (Generic Name) Other Names: Revlimid  About this drug Lenalidomide is used to treat cancer. It is given by mouth.  Possible side effects (more common) . Bone marrow depression: This medicine can cause low numbers of white blood cells, red blood cells, and platelets. This is called bone marrow depression. This can happen 1-2 weeks after you start the medicine. This can raise your risk for infection, make you tired and weak (fatigue), and raise your risk of bleeding. . Changes in bowel movements; some people get loose bowel movements (diarrhea), and others have trouble having a bowel movement (constipation) . Nausea and throwing up . Rash or itching . Swelling (fluid retention) in the legs, ankles, or feet . Feeling tired or weak (fatigue) . Headache . Pain in your joints or muscles . Blurred vision . Feeling dizzy . Fever  Possible side effects (less common) . Blood clots: A blood clot in your leg may cause your leg to swell, appear red and warm, and/or cause pain. A blood clot in your lungs may cause trouble breathing, pain when breathing, and/or chest pain. . Changes in the taste of food and drinks. . Loss of appetite (less hungry)  Treating side effects . Talk to your doctor or nurse about medicines to help control joint or muscle pain, headache, nausea, throwing up, or loose bowel movements. . If you get a rash do not put anything on it unless your doctor or nurse says you may. Keep the area around the rash clean and dry. . If you cannot move your bowels (constipated), talk your doctor or nurse about what  you can do. They may have medicines or diet ideas that may help you. Do not use enemas, laxatives, or suppositories without checking with your doctor or nurse.  Important information . Take this drug with water and swallow  the capsule whole. Do not break, chew, or open the capsule. . Store this medicine in its original bottle. Do not let it get too hot or too cold. . Do not donate blood while you are taking this drug. Do not donate blood until at least 4 weeks after you stop taking this drug. Marland Kitchen You will need to sign up for a special program called Revlamid REMS when you start taking this drug. The doctor's staff will help you get started. . Missed dose: If you miss a dose of lenalidomide, and it has been less than 12 hours since your normal timedose was due, take the dose as soon as you remember. If it has been more than 12 hours since your dose was due, skip the missed dose. Do not take 2 doses at the same time. Do not take extra doses.  Food and drug interactions . There are no known interactions of lenalidomide with food. This drug may interact with other medicines. Tell your doctor and pharmacist about all the medicines and dietary supplements (vitamins, minerals, herbs and others) that you are taking at this time. The safety and use of dietary supplements and alternative diets are often not known. Using these might affect your cancer or interfere with your treatment. Until more is known, you should not use dietary supplements or alternative diets without your cancer doctor's help.  When to call the doctor Call your doctor or nurse right away if you have any of these symptoms: . Fever over 100.4# F (38.0# C) or chills . Easy bruising or bleeding . Chest pain or trouble breathing . Pain, redness, or swelling of your arms or legs . Nausea that stops you from eating or drinking . Throwing up more than 3 times in one day . Loose bowel movements (diarrhea) more than 5 or 6 times in one day, or diarrhea with weakness . Severe headache or you feel dizzy Call your doctor or nurse as soon as possible if you have any of these symptoms: . A rash that bothers you . Swelling (fluid retention) in the legs, feet,  or ankles . No bowel movement for 3 days . Joint or muscle pain, nausea, throwing up, or loose bowel movements that are not relieved by prescribed medicines. . Feel really tired and weak or if you are not able to do things you normally do.  Infertility concerns . Sexual problems and reproduction concerns may happen. In both men and women, this drug may affect your ability to have children. This cannot be determined before your treatment. Talk with your doctor or nurse if you plan to have children. Ask for information on sperm or egg banking. . In men, this drug may interfere with your ability to make sperm, but it should not change your ability to have sexual relations. . In women, menstrual bleeding may become irregular or stop while you are getting this drug. Do not assume that you cannot become pregnant if you do not have a menstrual period. . Women may go through signs of menopause (change of life) like vaginal dryness or itching. Vaginal lubricants can be used to lessen vaginal dryness, itching, and pain during sexual relations. . Genetic counseling is available for you to talk about the effects of this drug  therapy on future pregnancies. Also, a genetic counselor can look at the possible risk of problems in the unborn baby due to this medicine if an exposure happens during pregnancy.  Reproduction concerns . Pregnancy warnings: . This drug may cause very harmful effects on an unborn child. Lenalidomide should never be used by women who are pregnant or who could become pregnant while taking the drug. Even 1 dose taken by a pregnant woman can cause these very harmful effects, including death of the unborn child. Your healthcare team will talk to you and give you written information about this risk. . Two negative pregnancy tests are required to be able to take this drug if you are at an age that you can get pregnant. . You will need to have routine pregnancy tests while you are  taking this drug. Marland Kitchen To prevent pregnancy, two methods of reliable birth control must be used by you and your partner for 4 weeks before you take this drug, while you are taking this drug, and for 4 weeks after your last dose of this drug. . Women who are taking this drug or a woman whose partner is taking this drug need to call their doctor right away if they think they are pregnant or if they get pregnant. . Breast feeding warning: It is not known if this drug passes into breast milk. For this reason, women should talk to their doctor about the risks and benefits of breast feeding during treatment with this drug because this drug may enter the breast milk and badly harm a breast feeding baby.    SELF CARE ACTIVITIES WHILE ON CHEMOTHERAPY: Hydration Increase your fluid intake 48 hours prior to treatment and drink at least 8 to 12 cups (64 ounces) of water/decaff beverages per day after treatment. You can still have your cup of coffee or soda but these beverages do not count as part of your 8 to 12 cups that you need to drink daily. No alcohol intake.  Medications Continue taking your normal prescription medication as prescribed.  If you start any new herbal or new supplements please let us know first to make sure it is safe.  Mouth Care Have teeth cleaned professionally before starting treatment. Keep dentures and partial plates clean. Use soft toothbrush and do not use mouthwashes that contain alcohol. Biotene is a good mouthwash that is available at most pharmacies or may be ordered by calling [phone]. Use warm salt water gargles (1 teaspoon salt per 1 quart warm water) before and after meals and at bedtime. Or you may rinse with 2 tablespoons of three-percent hydrogen peroxide mixed in eight ounces of water. If you are still having problems with your mouth or sores in your mouth please call the clinic. If you need dental work, please let the doctor know before you go for your  appointment so that we can coordinate the best possible time for you in regards to your chemo regimen. You need to also let your dentist know that you are actively taking chemo. We may need to do labs prior to your dental appointment.   Skin Care Always use sunscreen that has not expired and with SPF (Sun Protection Factor) of 50 or higher. Wear hats to protect your head from the sun. Remember to use sunscreen on your hands, ears, face, & feet.  Use good moisturizing lotions such as udder cream, eucerin, or even Vaseline. Some chemotherapies can cause dry skin, color changes in your skin and nails.    Marland Kitchen  Avoid long, hot showers or baths. . Use gentle, fragrance-free soaps and laundry detergent. . Use moisturizers, preferably creams or ointments rather than lotions because the thicker consistency is better at preventing skin dehydration. Apply the cream or ointment within 15 minutes of showering. Reapply moisturizer at night, and moisturize your hands every time after you wash them.  Hair Loss (if your doctor says your hair will fall out)  . If your doctor says that your hair is likely to fall out, decide before you begin chemo whether you want to wear a wig. You may want to shop before treatment to match your hair color. . Hats, turbans, and scarves can also camouflage hair loss, although some people prefer to leave their heads uncovered. If you go bare-headed outdoors, be sure to use sunscreen on your scalp. . Cut your hair short. It eases the inconvenience of shedding lots of hair, but it also can reduce the emotional impact of watching your hair fall out. . Don't perm or color your hair during chemotherapy. Those chemical treatments are already damaging to hair and can enhance hair loss. Once your chemo treatments are done and your hair has grown back, it's OK to resume dyeing or perming hair. With chemotherapy, hair loss is almost always temporary. But when it grows back, it may be a different  color or texture. In older adults who still had hair color before chemotherapy, the new growth may be completely gray.  Often, new hair is very fine and soft.  Infection Prevention Please wash your hands for at least 30 seconds using warm soapy water. Handwashing is the #1 way to prevent the spread of germs. Stay away from sick people or people who are getting over a cold. If you develop respiratory systems such as green/yellow mucus production or productive cough or persistent cough let us know and we will see if you need an antibiotic. It is a good idea to keep a pair of gloves on when going into grocery stores/[employer] to decrease your risk of coming into contact with germs on the carts, etc. Carry alcohol hand gel with you at all times and use it frequently if out in public. If your temperature reaches 100.5 or higher please call the clinic and let us know.  If it is after hours or on the weekend please go to the ER if your temperature is over 100.5.  Please have your own personal thermometer at home to use.    Sex and bodily fluids If you are going to have sex, a condom must be used to protect the person that isn't taking chemotherapy. Chemo can decrease your libido (sex drive). For a few days after chemotherapy, chemotherapy can be excreted through your bodily fluids.  When using the toilet please close the lid and flush the toilet twice.  Do this for a few day after you have had chemotherapy.     Effects of chemotherapy on your sex life Some changes are simple and won't last long. They won't affect your sex life permanently. Sometimes you may feel: . too tired . not strong enough to be very active . sick or sore  . not in the mood . anxious or low Your anxiety might not seem related to sex. For example, you may be worried about the cancer and how your treatment is going. Or you may be worried about money, or about how you family are coping with your illness. These things can cause stress,  which can affect your  interest in sex. It's important to talk to your partner about how you feel. Remember - the changes to your sex life don't usually last long. There's usually no medical reason to stop having sex during chemo. The drugs won't have any long term physical effects on your performance or enjoyment of sex. Cancer can't be passed on to your partner during sex  Contraception It's important to use reliable contraception during treatment. Avoid getting pregnant while you or your partner are having chemotherapy. This is because the drugs may harm the baby. Sometimes chemotherapy drugs can leave a man or woman infertile.  This means you would not be able to have children in the future. You might want to talk to someone about permanent infertility. It can be very difficult to learn that you may no longer be able to have children. Some people find counselling helpful. There might be ways to preserve your fertility, although this is easier for men than for women. You may want to speak to a fertility expert. You can talk about sperm banking or harvesting your eggs. You can also ask about other fertility options, such as donor eggs. If you have or have had breast cancer, your doctor might advise you not to take the contraceptive pill. This is because the hormones in it might affect the cancer.  It is not known for sure whether or not chemotherapy drugs can be passed on through semen or secretions from the vagina. Because of this some doctors advise people to use a barrier method if you have sex during treatment. This applies to vaginal, anal or oral sex. Generally, doctors advise a barrier method only for the time you are actually having the treatment and for about a week after your treatment. Advice like this can be worrying, but this does not mean that you have to avoid being intimate with your partner. You can still have close contact with your partner and continue to enjoy sex.  Animals If you  have cats or birds we just ask that you not change the litter or change the cage.  Please have someone else do this for you while you are on chemotherapy.   Food Safety During and After Cancer Treatment Food safety is important for people both during and after cancer treatment. Cancer and cancer treatments, such as chemotherapy, radiation therapy, and stem cell/bone marrow transplantation, often weaken the immune system. This makes it harder for your body to protect itself from foodborne illness, also called food poisoning. Foodborne illness is caused by eating food that contains harmful bacteria, parasites, or viruses.  Foods to avoid Some foods have a higher risk of becoming tainted with bacteria. These include: Marland Kitchen Unwashed fresh fruit and vegetables, especially leafy vegetables that can hide dirt and other contaminants . Raw sprouts, such as alfalfa sprouts . Raw or undercooked beef, especially ground beef, or other raw or undercooked meat and poultry . Fatty, fried, or spicy foods immediately before or after treatment.  These can sit heavy on your stomach and make you feel nauseous. . Raw or undercooked shellfish, such as oysters. . Sushi and sashimi, which often contain raw fish.  . Unpasteurized beverages, such as unpasteurized fruit juices, raw milk, raw yogurt, or cider . Undercooked eggs, such as soft boiled, over easy, and poached; raw, unpasteurized eggs; or foods made with raw egg, such as homemade raw cookie dough and homemade mayonnaise Simple steps for food safety Shop smart. . Do not buy food stored or displayed in an  unclean area. . Do not buy bruised or damaged fruits or vegetables. . Do not buy cans that have cracks, dents, or bulges. . Pick up foods that can spoil at the end of your shopping trip and store them in a cooler on the way home. Prepare and clean up foods carefully. . Rinse all fresh fruits and vegetables under running water, and dry them with a clean towel or  paper towel. . Clean the top of cans before opening them. . After preparing food, wash your hands for 20 seconds with hot water and soap. Pay special attention to areas between fingers and under nails. . Clean your utensils and dishes with hot water and soap. Marland Kitchen Disinfect your kitchen and cutting boards using 1 teaspoon of liquid, unscented bleach mixed into 1 quart of water.   Dispose of old food. . Eat canned and packaged food before its expiration date (the "use by" or "best before" date). . Consume refrigerated leftovers within 3 to 4 days. After that time, throw out the food. Even if the food does not smell or look spoiled, it still may be unsafe. Some bacteria, such as Listeria, can grow even on foods stored in the refrigerator if they are kept for too long. Take precautions when eating out. . At restaurants, avoid buffets and salad bars where food sits out for a long time and comes in contact with many people. Food can become contaminated when someone with a virus, often a norovirus, or another "bug" handles it. . Put any leftover food in a "to-go" container yourself, rather than having the server do it. And, refrigerate leftovers as soon as you get home. . Choose restaurants that are clean and that are willing to prepare your food as you order it cooked.    MEDICATIONS:  Dexamethasone '4mg'$  tablet. Take 10 tablets (40 mg) on days 1, 8, and 15 of chemo. Repeat every 21 days.  Take with food.                                                                                                                                                                 Zofran/Ondansetron '8mg'$  tablet. Take 1 tablet every 8 hours as needed for nausea/vomiting. (#1 nausea med to take, this can constipate)  Compazine/Prochlorperazine '10mg'$  tablet. Take 1 tablet every 6 hours as needed for nausea/vomiting. (#2 nausea med to take, this can make you sleepy)   Over-the-Counter Meds:  Miralax 1 capful in 8 oz of fluid  daily. May increase to two times a day if needed. This is a stool softener. If this doesn't work proceed you can add:  Senokot S-start with 1 tablet two times a day and increase to 4 tablets two times a day if needed. (total of 8 tablets  in a 24 hour period). This is a stimulant laxative.   Call us if this does not help your bowels move.   Imodium '2mg'$  capsule. Take 2 capsules after the 1st loose stool and then 1 capsule every 2 hours until you go a total of 12 hours without having a loose stool. Call the [facility] if loose stools continue. If diarrhea occurs @ bedtime, take 2 capsules @ bedtime. Then take 2 capsules every 4 hours until morning. Call [facility].     Nausea Sheet  Zofran/Ondansetron '8mg'$  tablet. Take 1 tablet every 8 hours as needed for nausea/vomiting. (#1 nausea med to take, this can constipate)  Compazine/Prochlorperazine '10mg'$  tablet. Take 1 tablet every 6 hours as needed for nausea/vomiting. (#2 nausea med to take, this can make you sleepy)  You can take these medications together or separately.  We would first like for you to try the Ondansetron by itself and then take the Prochloperizine if needed. But you are allowed to take both medications at the same time if your nausea is that severe.  If you are having persistent nausea (nausea that does not stop) please take these medications on a staggered schedule so that the nausea medication stays in your body.  Please call the [facility] and let us know the amount of nausea that you are experiencing.  If you begin to vomit, you need to call the [facility] and if it is the weekend and you have vomited more than one time and cant get it to stop-go to the Emergency Room.  Persistent nausea/vomiting can lead to dehydration (loss of fluid in your body) and will make you feel terrible.   Ice chips, sips of clear liquids, foods that are @ room temperature, crackers, and toast tend to be better tolerated.     Diarrhea Sheet   If you are having loose stools/diarrhea, please purchase Imodium and begin taking as outlined:  At the first sign of poorly formed or loose stools you should begin taking Imodium(loperamide) 2 mg capsules.  Take two caplets ('4mg'$ ) followed by one caplet ('2mg'$ ) every 2 hours until you have had no diarrhea for 12 hours.  During the night take two caplets ('4mg'$ ) at bedtime and continue every 4 hours during the night until the morning.  Stop taking Imodium only after there is no sign of diarrhea for 12 hours.    Always call the [facility] if you are having loose stools/diarrhea that you can't get under control.  Loose stools/disrrhea leads to dehydration (loss of water) in your body.  We have other options of trying to get the loose stools/diarrhea to stopped but you must let us know!    Constipation Sheet *Miralax in 8 oz of fluid daily.  May increase to two times a day if needed.  This is a stool softener.  If this not enough to keep your bowel regular:  You can add:  *Senokot S, start with one tablet twice a day and can increase to 4 tablets twice a day if needed.  This is a stimulant laxative.   Sometimes when you take pain medication you need BOTH a medicine to keep your stool soft and a medicine to help your bowel push it out!  Please call if the above does not work for you.   Do not go more than 2 days without a bowel movement.  It is very important that you do not become constipated.  It will make you feel sick to  your stomach (nausea) and can cause abdominal pain and vomiting.     SYMPTOMS TO REPORT AS SOON AS POSSIBLE AFTER TREATMENT:  FEVER GREATER THAN 100.5 F  CHILLS WITH OR WITHOUT FEVER  NAUSEA AND VOMITING THAT IS NOT CONTROLLED WITH YOUR NAUSEA MEDICATION  UNUSUAL SHORTNESS OF BREATH  UNUSUAL BRUISING OR BLEEDING  TENDERNESS IN MOUTH AND THROAT WITH OR WITHOUT PRESENCE OF ULCERS  URINARY PROBLEMS  BOWEL PROBLEMS  UNUSUAL RASH    Wear comfortable clothing and  clothing appropriate for easy access to any Portacath or PICC line. Let us know if there is anything that we can do to make your therapy better!    What to do if you need assistance after hours or on the weekends: CALL [phone].  HOLD on the line, do not hang up.  You will hear multiple messages but at the end you will be connected with a nurse triage line.  They will contact the doctor if necessary.  Most of the time they will be able to assist you.  Do not call the hospital operator.      I have been informed and understand all of the instructions given to me and have received a copy. I have been instructed to call the clinic [phone] or my family physician as soon as possible for continued medical care, if indicated. I do not have any more questions at this time but understand that I may call the [facility] or the Patient Navigator at [phone] during office hours should I have questions or need assistance in obtaining follow-up care.

## 2017-02-23 NOTE — Progress Notes (Signed)
Chemotherapy teaching pulled together. 

## 2017-02-24 ENCOUNTER — Telehealth (HOSPITAL_COMMUNITY): Payer: Self-pay | Admitting: Oncology

## 2017-02-24 NOTE — Telephone Encounter (Signed)
REVLIMID APPROVED BY [employer]. COPAY AMT  AND DELIVERY PENDING

## 2017-03-01 ENCOUNTER — Encounter (HOSPITAL_COMMUNITY): Payer: Medicare Other

## 2017-03-01 ENCOUNTER — Encounter (HOSPITAL_BASED_OUTPATIENT_CLINIC_OR_DEPARTMENT_OTHER): Payer: Medicare Other

## 2017-03-01 ENCOUNTER — Encounter (HOSPITAL_COMMUNITY): Payer: Medicare Other | Attending: Oncology

## 2017-03-01 VITALS — BP 134/71 | HR 71 | Temp 98.1°F | Resp 18

## 2017-03-01 DIAGNOSIS — Z170 Estrogen receptor positive status [ER+]: Secondary | ICD-10-CM | POA: Insufficient documentation

## 2017-03-01 DIAGNOSIS — C7951 Secondary malignant neoplasm of bone: Secondary | ICD-10-CM | POA: Diagnosis not present

## 2017-03-01 DIAGNOSIS — C9000 Multiple myeloma not having achieved remission: Secondary | ICD-10-CM | POA: Diagnosis not present

## 2017-03-01 DIAGNOSIS — C50922 Malignant neoplasm of unspecified site of left male breast: Secondary | ICD-10-CM | POA: Diagnosis not present

## 2017-03-01 DIAGNOSIS — Z5112 Encounter for antineoplastic immunotherapy: Secondary | ICD-10-CM | POA: Diagnosis present

## 2017-03-01 LAB — COMPREHENSIVE METABOLIC PANEL
ALBUMIN: 3.3 g/dL — AB (ref 3.5–5.0)
ALK PHOS: 25 U/L — AB (ref 38–126)
ALT: 21 U/L (ref 17–63)
ANION GAP: 4 — AB (ref 5–15)
AST: 27 U/L (ref 15–41)
BILIRUBIN TOTAL: 0.4 mg/dL (ref 0.3–1.2)
BUN: 16 mg/dL (ref 6–20)
CALCIUM: 9.6 mg/dL (ref 8.9–10.3)
CO2: 26 mmol/L (ref 22–32)
Chloride: 103 mmol/L (ref 101–111)
Creatinine, Ser: 1.39 mg/dL — ABNORMAL HIGH (ref 0.61–1.24)
GFR calc non Af Amer: 51 mL/min — ABNORMAL LOW (ref >60)
GFR, EST AFRICAN AMERICAN: 59 mL/min — AB (ref >60)
GLUCOSE: 105 mg/dL — AB (ref 65–99)
Potassium: 4.2 mmol/L (ref 3.5–5.1)
Sodium: 133 mmol/L — ABNORMAL LOW (ref 135–145)
TOTAL PROTEIN: 9 g/dL — AB (ref 6.5–8.1)

## 2017-03-01 LAB — CBC WITH DIFFERENTIAL/PLATELET
Basophils Absolute: 0 10*3/uL (ref 0.0–0.1)
Basophils Relative: 1 %
Eosinophils Absolute: 0.2 10*3/uL (ref 0.0–0.7)
Eosinophils Relative: 4 %
HEMATOCRIT: 27.8 % — AB (ref 39.0–52.0)
HEMOGLOBIN: 9.4 g/dL — AB (ref 13.0–17.0)
LYMPHS ABS: 2.4 10*3/uL (ref 0.7–4.0)
Lymphocytes Relative: 45 %
MCH: 32.5 pg (ref 26.0–34.0)
MCHC: 33.8 g/dL (ref 30.0–36.0)
MCV: 96.2 fL (ref 78.0–100.0)
MONO ABS: 0.6 10*3/uL (ref 0.1–1.0)
MONOS PCT: 11 %
NEUTROS ABS: 2.1 10*3/uL (ref 1.7–7.7)
Neutrophils Relative %: 39 %
Platelets: 175 10*3/uL (ref 150–400)
RBC: 2.89 MIL/uL — ABNORMAL LOW (ref 4.22–5.81)
RDW: 13.7 % (ref 11.5–15.5)
WBC: 5.3 10*3/uL (ref 4.0–10.5)

## 2017-03-01 MED ORDER — BORTEZOMIB CHEMO SQ INJECTION 3.5 MG (2.5MG/ML)
1.3000 mg/m2 | Freq: Once | INTRAMUSCULAR | Status: AC
  Administered 2017-03-01: 2.75 mg via SUBCUTANEOUS
  Filled 2017-03-01: qty 2.75

## 2017-03-01 MED ORDER — PROCHLORPERAZINE MALEATE 10 MG PO TABS
10.0000 mg | ORAL_TABLET | Freq: Once | ORAL | Status: AC
  Administered 2017-03-01: 10 mg via ORAL

## 2017-03-01 MED ORDER — PROCHLORPERAZINE MALEATE 10 MG PO TABS
ORAL_TABLET | ORAL | Status: AC
  Filled 2017-03-01: qty 1

## 2017-03-01 NOTE — Patient Instructions (Signed)
[facility] Discharge Instructions for Patients Receiving Chemotherapy   Beginning January 23rd 2017 lab work for the [facility] will be done in the  Main lab at [facility] on 1st floor. If you have a lab appointment with the [facility] please come in thru the  Main Entrance and check in at the main information desk   Today you received the following chemotherapy agents velcade injection.  To help prevent nausea and vomiting after your treatment, we encourage you to take your nausea medication as instructed.   If you develop nausea and vomiting, or diarrhea that is not controlled by your medication, call the clinic.  The clinic phone number is [phone]. Office hours are Monday-Friday 8:30am-5:00pm.  BELOW ARE SYMPTOMS THAT SHOULD BE REPORTED IMMEDIATELY:  *FEVER GREATER THAN 101.0 F  *CHILLS WITH OR WITHOUT FEVER  NAUSEA AND VOMITING THAT IS NOT CONTROLLED WITH YOUR NAUSEA MEDICATION  *UNUSUAL SHORTNESS OF BREATH  *UNUSUAL BRUISING OR BLEEDING  TENDERNESS IN MOUTH AND THROAT WITH OR WITHOUT PRESENCE OF ULCERS  *URINARY PROBLEMS  *BOWEL PROBLEMS  UNUSUAL RASH Items with * indicate a potential emergency and should be followed up as soon as possible. If you have an emergency after office hours please contact your primary care physician or go to the nearest emergency department.  Please call the clinic during office hours if you have any questions or concerns.   You may also contact the Patient Navigator at [phone] should you have any questions or need assistance in obtaining follow up care.      Resources For Cancer Patients and their Caregivers ? American Cancer Society: Can assist with transportation, wigs, general needs, runs Look Good Feel Better.        ([phone] 669-1186 ? Cancer Care: Provides financial assistance, online support groups, medication/co-pay assistance.  1-800-813-HOPE (220)238-3587) ? [place] Assists [place] cancer patients and their families through emotional , educational and financial support.  334-749-5568 ? [place] DSS Where to apply for food stamps, Medicaid and utility assistance. 985-541-1158 ? [facility]: Transportation to medical appointments. 252 530 4785 ? Social Security Administration: May apply for disability if have a Stage IV cancer. 772-182-8528 870-602-2392 ? [employer], Disability and Transit Services: Assists with nutrition, care and transit needs. [phone]

## 2017-03-01 NOTE — Progress Notes (Signed)
Chemotherapy teaching completed.  Calender given to help pt understand tx days.  Medication written down by morning and night of what he is suppose to take.  Extensive teaching packet given.  Consent signed.

## 2017-03-01 NOTE — Progress Notes (Signed)
Velcade injection given as ordered. Stable on discharge home with wife.

## 2017-03-04 ENCOUNTER — Ambulatory Visit (HOSPITAL_COMMUNITY): Payer: Medicare Other

## 2017-03-04 ENCOUNTER — Other Ambulatory Visit (HOSPITAL_COMMUNITY): Payer: Medicare Other

## 2017-03-08 ENCOUNTER — Encounter (HOSPITAL_COMMUNITY): Payer: Medicare Other

## 2017-03-08 ENCOUNTER — Encounter (HOSPITAL_BASED_OUTPATIENT_CLINIC_OR_DEPARTMENT_OTHER): Payer: Medicare Other

## 2017-03-08 ENCOUNTER — Encounter (HOSPITAL_COMMUNITY): Payer: Self-pay

## 2017-03-08 VITALS — BP 126/70 | HR 63 | Temp 98.4°F | Resp 18 | Wt 208.6 lb

## 2017-03-08 DIAGNOSIS — Z5112 Encounter for antineoplastic immunotherapy: Secondary | ICD-10-CM

## 2017-03-08 DIAGNOSIS — C9000 Multiple myeloma not having achieved remission: Secondary | ICD-10-CM

## 2017-03-08 DIAGNOSIS — C7951 Secondary malignant neoplasm of bone: Secondary | ICD-10-CM | POA: Diagnosis not present

## 2017-03-08 DIAGNOSIS — Z170 Estrogen receptor positive status [ER+]: Secondary | ICD-10-CM | POA: Diagnosis not present

## 2017-03-08 DIAGNOSIS — C50922 Malignant neoplasm of unspecified site of left male breast: Secondary | ICD-10-CM | POA: Diagnosis not present

## 2017-03-08 DIAGNOSIS — J42 Unspecified chronic bronchitis: Secondary | ICD-10-CM

## 2017-03-08 LAB — CBC WITH DIFFERENTIAL/PLATELET
Basophils Absolute: 0 10*3/uL (ref 0.0–0.1)
Basophils Relative: 0 %
EOS PCT: 1 %
Eosinophils Absolute: 0.1 10*3/uL (ref 0.0–0.7)
HCT: 28 % — ABNORMAL LOW (ref 39.0–52.0)
HEMOGLOBIN: 9.6 g/dL — AB (ref 13.0–17.0)
Lymphocytes Relative: 14 %
Lymphs Abs: 0.9 10*3/uL (ref 0.7–4.0)
MCH: 32.9 pg (ref 26.0–34.0)
MCHC: 34.3 g/dL (ref 30.0–36.0)
MCV: 95.9 fL (ref 78.0–100.0)
MONO ABS: 0.3 10*3/uL (ref 0.1–1.0)
MONOS PCT: 4 %
NEUTROS PCT: 81 %
Neutro Abs: 5 10*3/uL (ref 1.7–7.7)
Platelets: 151 10*3/uL (ref 150–400)
RBC: 2.92 MIL/uL — AB (ref 4.22–5.81)
RDW: 13.9 % (ref 11.5–15.5)
WBC: 6.3 10*3/uL (ref 4.0–10.5)

## 2017-03-08 LAB — COMPREHENSIVE METABOLIC PANEL
ALK PHOS: 25 U/L — AB (ref 38–126)
ALT: 47 U/L (ref 17–63)
AST: 42 U/L — AB (ref 15–41)
Albumin: 3.4 g/dL — ABNORMAL LOW (ref 3.5–5.0)
Anion gap: 5 (ref 5–15)
BUN: 16 mg/dL (ref 6–20)
CALCIUM: 9.3 mg/dL (ref 8.9–10.3)
CO2: 25 mmol/L (ref 22–32)
CREATININE: 1.45 mg/dL — AB (ref 0.61–1.24)
Chloride: 105 mmol/L (ref 101–111)
GFR calc non Af Amer: 48 mL/min — ABNORMAL LOW (ref >60)
GFR, EST AFRICAN AMERICAN: 56 mL/min — AB (ref >60)
GLUCOSE: 116 mg/dL — AB (ref 65–99)
Potassium: 4.3 mmol/L (ref 3.5–5.1)
SODIUM: 135 mmol/L (ref 135–145)
Total Bilirubin: 0.4 mg/dL (ref 0.3–1.2)
Total Protein: 8.3 g/dL — ABNORMAL HIGH (ref 6.5–8.1)

## 2017-03-08 MED ORDER — PROCHLORPERAZINE MALEATE 10 MG PO TABS
10.0000 mg | ORAL_TABLET | Freq: Once | ORAL | Status: AC
  Administered 2017-03-08: 10 mg via ORAL
  Filled 2017-03-08: qty 1

## 2017-03-08 MED ORDER — BORTEZOMIB CHEMO SQ INJECTION 3.5 MG (2.5MG/ML)
1.3000 mg/m2 | Freq: Once | INTRAMUSCULAR | Status: AC
  Administered 2017-03-08: 2.75 mg via SUBCUTANEOUS
  Filled 2017-03-08: qty 1.4

## 2017-03-08 MED ORDER — ALBUTEROL SULFATE HFA 108 (90 BASE) MCG/ACT IN AERS: 2.0000 | INHALATION_SPRAY | Freq: Four times a day (QID) | RESPIRATORY_TRACT | 0 refills | Status: DC | PRN

## 2017-03-08 NOTE — Progress Notes (Signed)
Johnny Lucas tolerated Velcade injection well without complaints or incident. VSS Labs reviewed prior to administering chemotherapy injection. Pt continues to take Revlimid as prescribed without any problems. Pt discharged self ambulatory in satisfactory condition accompanied by his family

## 2017-03-08 NOTE — Patient Instructions (Signed)
[facility] Discharge Instructions for Patients Receiving Chemotherapy   Beginning January 23rd 2017 lab work for the Cancer Center will be done in the  Main lab at [facility] on 1st floor. If you have a lab appointment with the Cancer Center please come in thru the  Main Entrance and check in at the main information desk   Today you received the following chemotherapy agents Velcade injection. Follow-up as scheduled. Call clinic for any questions or concerns  To help prevent nausea and vomiting after your treatment, we encourage you to take your nausea medication   If you develop nausea and vomiting, or diarrhea that is not controlled by your medication, call the clinic.  The clinic phone number is [phone]. Office hours are Monday-Friday 8:30am-5:00pm.  BELOW ARE SYMPTOMS THAT SHOULD BE REPORTED IMMEDIATELY:  *FEVER GREATER THAN 101.0 F  *CHILLS WITH OR WITHOUT FEVER  NAUSEA AND VOMITING THAT IS NOT CONTROLLED WITH YOUR NAUSEA MEDICATION  *UNUSUAL SHORTNESS OF BREATH  *UNUSUAL BRUISING OR BLEEDING  TENDERNESS IN MOUTH AND THROAT WITH OR WITHOUT PRESENCE OF ULCERS  *URINARY PROBLEMS  *BOWEL PROBLEMS  UNUSUAL RASH Items with * indicate a potential emergency and should be followed up as soon as possible. If you have an emergency after office hours please contact your primary care physician or go to the nearest emergency department.  Please call the clinic during office hours if you have any questions or concerns.   You may also contact the Patient Navigator at [phone] should you have any questions or need assistance in obtaining follow up care.      Resources For Cancer Patients and their Caregivers ? American Cancer Society: Can assist with transportation, wigs, general needs, runs Look Good Feel Better.        1-888-227-6333 ? Cancer Care: Provides financial assistance, online support groups, medication/co-pay assistance.   1-800-813-HOPE (4673) ? [facility] Assists [place] cancer patients and their families through emotional , educational and financial support.  336-427-4357 ? [place] DSS Where to apply for food stamps, Medicaid and utility assistance. 336-342-1394 ? [facility]: Transportation to medical appointments. 336-347-2287 ? Social Security Administration: May apply for disability if have a Stage IV cancer. 336-342-7796 1-800-772-1213 ? [place] Aging, Disability and Transit Services: Assists with nutrition, care and transit needs. 336-349-2343         

## 2017-03-11 ENCOUNTER — Other Ambulatory Visit (HOSPITAL_COMMUNITY): Payer: Medicare Other

## 2017-03-11 ENCOUNTER — Ambulatory Visit (HOSPITAL_COMMUNITY): Payer: Medicare Other

## 2017-03-15 ENCOUNTER — Telehealth (HOSPITAL_COMMUNITY): Payer: Self-pay | Admitting: Oncology

## 2017-03-15 ENCOUNTER — Encounter (HOSPITAL_COMMUNITY): Payer: Medicare Other

## 2017-03-15 ENCOUNTER — Inpatient Hospital Stay (HOSPITAL_COMMUNITY): Payer: Medicare Other

## 2017-03-15 ENCOUNTER — Encounter (HOSPITAL_BASED_OUTPATIENT_CLINIC_OR_DEPARTMENT_OTHER): Payer: Medicare Other

## 2017-03-15 ENCOUNTER — Encounter (HOSPITAL_COMMUNITY): Payer: Self-pay | Admitting: Adult Health

## 2017-03-15 ENCOUNTER — Encounter (HOSPITAL_BASED_OUTPATIENT_CLINIC_OR_DEPARTMENT_OTHER): Payer: Medicare Other | Admitting: Adult Health

## 2017-03-15 VITALS — BP 109/64 | HR 69 | Temp 98.0°F | Resp 16 | Ht 68.0 in | Wt 208.0 lb

## 2017-03-15 DIAGNOSIS — C7951 Secondary malignant neoplasm of bone: Secondary | ICD-10-CM

## 2017-03-15 DIAGNOSIS — D649 Anemia, unspecified: Secondary | ICD-10-CM

## 2017-03-15 DIAGNOSIS — C9000 Multiple myeloma not having achieved remission: Secondary | ICD-10-CM

## 2017-03-15 DIAGNOSIS — C50122 Malignant neoplasm of central portion of left male breast: Secondary | ICD-10-CM | POA: Diagnosis not present

## 2017-03-15 DIAGNOSIS — C50922 Malignant neoplasm of unspecified site of left male breast: Secondary | ICD-10-CM | POA: Diagnosis not present

## 2017-03-15 DIAGNOSIS — R21 Rash and other nonspecific skin eruption: Secondary | ICD-10-CM

## 2017-03-15 DIAGNOSIS — Z170 Estrogen receptor positive status [ER+]: Secondary | ICD-10-CM | POA: Diagnosis not present

## 2017-03-15 DIAGNOSIS — D696 Thrombocytopenia, unspecified: Secondary | ICD-10-CM | POA: Diagnosis not present

## 2017-03-15 DIAGNOSIS — Z5112 Encounter for antineoplastic immunotherapy: Secondary | ICD-10-CM

## 2017-03-15 LAB — CBC WITH DIFFERENTIAL/PLATELET
BASOS PCT: 0 %
Basophils Absolute: 0 10*3/uL (ref 0.0–0.1)
EOS ABS: 0.2 10*3/uL (ref 0.0–0.7)
EOS PCT: 4 %
HCT: 27.9 % — ABNORMAL LOW (ref 39.0–52.0)
Hemoglobin: 9 g/dL — ABNORMAL LOW (ref 13.0–17.0)
LYMPHS ABS: 0.8 10*3/uL (ref 0.7–4.0)
Lymphocytes Relative: 16 %
MCH: 31.7 pg (ref 26.0–34.0)
MCHC: 32.3 g/dL (ref 30.0–36.0)
MCV: 98.2 fL (ref 78.0–100.0)
MONOS PCT: 7 %
Monocytes Absolute: 0.4 10*3/uL (ref 0.1–1.0)
NEUTROS PCT: 73 %
Neutro Abs: 3.6 10*3/uL (ref 1.7–7.7)
PLATELETS: 139 10*3/uL — AB (ref 150–400)
RBC: 2.84 MIL/uL — ABNORMAL LOW (ref 4.22–5.81)
RDW: 13.8 % (ref 11.5–15.5)
WBC: 4.9 10*3/uL (ref 4.0–10.5)

## 2017-03-15 LAB — COMPREHENSIVE METABOLIC PANEL
ALBUMIN: 3.4 g/dL — AB (ref 3.5–5.0)
ALT: 38 U/L (ref 17–63)
ANION GAP: 5 (ref 5–15)
AST: 24 U/L (ref 15–41)
Alkaline Phosphatase: 26 U/L — ABNORMAL LOW (ref 38–126)
BUN: 16 mg/dL (ref 6–20)
CALCIUM: 8.8 mg/dL — AB (ref 8.9–10.3)
CHLORIDE: 105 mmol/L (ref 101–111)
CO2: 24 mmol/L (ref 22–32)
Creatinine, Ser: 1.37 mg/dL — ABNORMAL HIGH (ref 0.61–1.24)
GFR calc non Af Amer: 51 mL/min — ABNORMAL LOW (ref >60)
GFR, EST AFRICAN AMERICAN: 60 mL/min — AB (ref >60)
GLUCOSE: 115 mg/dL — AB (ref 65–99)
POTASSIUM: 3.7 mmol/L (ref 3.5–5.1)
SODIUM: 134 mmol/L — AB (ref 135–145)
Total Bilirubin: 0.5 mg/dL (ref 0.3–1.2)
Total Protein: 7.2 g/dL (ref 6.5–8.1)

## 2017-03-15 LAB — LACTATE DEHYDROGENASE: LDH: 113 U/L (ref 98–192)

## 2017-03-15 MED ORDER — PROCHLORPERAZINE MALEATE 10 MG PO TABS
10.0000 mg | ORAL_TABLET | Freq: Once | ORAL | Status: AC
  Administered 2017-03-15: 10 mg via ORAL

## 2017-03-15 MED ORDER — DENOSUMAB 120 MG/1.7ML ~~LOC~~ SOLN
120.0000 mg | Freq: Once | SUBCUTANEOUS | Status: AC
  Administered 2017-03-15: 120 mg via SUBCUTANEOUS
  Filled 2017-03-15: qty 1.7

## 2017-03-15 MED ORDER — PROCHLORPERAZINE MALEATE 10 MG PO TABS
ORAL_TABLET | ORAL | Status: AC
  Filled 2017-03-15: qty 1

## 2017-03-15 MED ORDER — BORTEZOMIB CHEMO SQ INJECTION 3.5 MG (2.5MG/ML)
1.3000 mg/m2 | Freq: Once | INTRAMUSCULAR | Status: AC
  Administered 2017-03-15: 2.75 mg via SUBCUTANEOUS
  Filled 2017-03-15: qty 2.75

## 2017-03-15 MED ORDER — LENALIDOMIDE 25 MG PO CAPS: ORAL_CAPSULE | ORAL | 3 refills | Status: DC

## 2017-03-15 MED ORDER — DEXAMETHASONE 4 MG PO TABS: ORAL_TABLET | ORAL | 3 refills | Status: DC

## 2017-03-15 NOTE — Telephone Encounter (Signed)
FAXED REV [facility]

## 2017-03-15 NOTE — Progress Notes (Addendum)
[place], [place] [address]   CLINIC:  Medical Oncology/Hematology  PCP:  [name], MD [place] [address] [phone]   REASON FOR VISIT:  Follow-up for Stage IV breast cancer with bone mets AND Stage II multiple myeloma   CURRENT THERAPY: Revlimid/Velcade/Dexamethasone every 21 days with Delton See every 28 days   BRIEF ONCOLOGIC HISTORY:    Breast cancer, male ([place])   12/31/2009 Initial Biopsy    Biopsy of L breast       12/31/2009 Pathology Results    Invasive ductal carcinoma, ER/PR+, HER 2 negative      12/31/2009 Imaging    Ultrasound showing a 2.43 x 1.85 x 3 cm hypoechoic spiculated mass in the 12 o clock L breast retroareolar region      01/01/2010 -  Anti-estrogen oral therapy    Tamoxifen 20 mg daily      01/06/2010 Imaging    Bone scan abnormal uptake in the diaphysis of the R humerus, abnormal in the R third, fifth and sixth ribs, lesion also noted in the sternum.      02/03/2010 Surgery    Rod placement and fixation of R humerus by Dr. [name]      02/05/2010 - 02/18/2010 Radiation Therapy    30Gy in 10 fractions of 3 Gy per fraction to R pathologic fracture      03/11/2010 -  Chemotherapy    Denosumab monthly, now every 3 months. [facility]ed at [facility]       06/09/2016 Imaging    Three hypermetabolic osseous lesions in the sternum, left ilium and right ilium, as discussed above, likely represent osseous metastases. At this time, these are not recognizable on the CT images. 2. No extra skeletal metastatic disease identified in the neck, chest, abdomen or pelvis.      10/13/2016 Progression    PET shows various new and enlarging osseous metastatic lesions with no definite extra osseous metastatic disease currently identified.       12/31/2016 Progression    1. Multifocal hypermetabolic osseous metastases throughout the axial and proximal appendicular skeleton, which are increased in size, number and  metabolism since 10/13/2016 PET-CT. 2. 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 3. No additional sites of hypermetabolic metastatic disease. 4. Chronic right mastoid sinusitis. 5. Aortic atherosclerosis.  One vessel coronary atherosclerosis.       Multiple myeloma ([place])   02/12/2017 Bone Marrow Biopsy    The marrow was variably cellular with large peritrabecular aggregates of kappa restricted plasma cells (66% by aspirate, 30% by Cd138). Cytogenetics +11.         HISTORY OF PRESENT ILLNESS:  (From Dr. Laverle Patter last note on 02/18/17)     INTERVAL HISTORY:  Mr. Ostermiller 71 y.o. male here for routine follow-up for multiple myeloma and Stage IV metastatic breast cancer.   Today is cycle #1, day 15 of Revlimid/Velcade/Decadron for multiple myeloma. Her tolerated his first doses of Velcade very well. Remains on Revlimid and Decadron.  His appetite is "okay"; endorses occasional dysgeusia.  His energy levels are okay as well, "but I have to push myself." He is exercising regularly by walking and light weightlifting.  Denies any new bone pains; endorses occasional arthralgias to his left ankle with cramping sensation.    He feels weak and "swimmy headed" at times. He is trying to drink plenty of  water.  His bowels are moving well; denies any dysuria or hematuria.    He and his wife have questions today about what to expect going forward and "how do we know if the medicines are working for the myeloma."  Otherwise, he is largely without complaints today.     REVIEW OF SYSTEMS:  Review of Systems  Constitutional: Positive for fatigue. Negative for appetite change, chills and fever.  HENT:  Negative.  Negative for lump/mass and nosebleeds.   Eyes: Negative.   Respiratory: Negative.  Negative for cough and shortness of breath.   Cardiovascular: Negative.  Negative for chest  pain and leg swelling.  Gastrointestinal: Negative.  Negative for abdominal pain, blood in stool, constipation, diarrhea, nausea and vomiting.  Endocrine: Negative.   Genitourinary: Negative.  Negative for dysuria and hematuria.   Musculoskeletal: Positive for arthralgias.  Skin: Negative.  Negative for rash.  Neurological: Positive for dizziness. Negative for headaches.  Hematological: Negative.  Negative for adenopathy. Does not bruise/bleed easily.  Psychiatric/Behavioral: Negative.  Negative for depression and sleep disturbance. The patient is not nervous/anxious.      PAST MEDICAL/SURGICAL HISTORY:  Past Medical History:  Diagnosis Date  . Anxiety   . Breast cancer ([place]) 2011   Stave IV breast cancer; radiation and tamoxifen  . GERD (gastroesophageal reflux disease)   . Hypertension   . Macular degeneration    Past Surgical History:  Procedure Laterality Date  . BACK SURGERY    . HERNIA REPAIR    . right arm surgery       SOCIAL HISTORY:  Social History   Social History  . Marital status: Married    Spouse name: N/A  . Number of children: 3  . Years of education: N/A   Occupational History  . Not on file.   Social History Main Topics  . Smoking status: Never Smoker  . Smokeless tobacco: Former Systems developer  . Alcohol use 14.4 oz/week    24 Cans of beer per week  . Drug use: No  . Sexual activity: Not on file   Other Topics Concern  . Not on file   Social History Narrative  . No narrative on file    FAMILY HISTORY:  Family History  Problem Relation Age of Onset  . Stroke Mother   . Cancer Maternal Aunt     cancer NOS; died in her 82s  . Lung cancer Maternal Uncle     smoker    CURRENT MEDICATIONS:  Outpatient Encounter Prescriptions as of 03/15/2017  Medication Sig Note  . acyclovir (ZOVIRAX) 400 MG tablet Take 1 tablet (400 mg total) by mouth 2 (two) times daily.   Marland Kitchen albuterol (PROVENTIL HFA;VENTOLIN HFA) 108 (90 Base) MCG/ACT inhaler Inhale 2 puffs  into the lungs every 6 (six) hours as needed for wheezing or shortness of breath.   . ALPRAZolam (XANAX) 0.5 MG tablet Take 1 tablet (0.5 mg total) by mouth at bedtime as needed for anxiety.   Marland Kitchen aspirin EC 81 MG tablet Take by mouth. 09/10/2016: Received from: [place]: Take 162 mg by mouth daily.  . bortezomib IV (VELCADE) 3.5 MG injection Inject 1.3 mg/m2 into the vein once.   . Calcium Carb-Cholecalciferol 500-600 MG-UNIT TABS Take 2 tablets by mouth daily.   . calcium carbonate (TUMS - DOSED IN MG ELEMENTAL CALCIUM) 500 MG chewable tablet Chew 2 tablets by mouth daily.   Marland Kitchen dexamethasone (DECADRON) 4 MG tablet Take 10 tablets (40 mg) on days 1,  8, and 15 of chemo. Repeat every 21 days.   Marland Kitchen lenalidomide (REVLIMID) 25 MG capsule Take one capsule daily on days 1-14 every 21 days.   . metoprolol succinate (TOPROL-XL) 100 MG 24 hr tablet  01/04/2017: Received from: External Pharmacy  . Multiple Vitamins-Minerals (OCUVITE PO) Take by mouth. 09/10/2016: Received from: [place]: Take 1 tablet by mouth daily.  . pantoprazole (PROTONIX) 40 MG tablet Take 40 mg by mouth daily.   . tamoxifen (NOLVADEX) 20 MG tablet Take 20 mg by mouth daily.   . vitamin B-12 (CYANOCOBALAMIN) 1000 MCG tablet Take by mouth. 09/10/2016: Received from: [place]: Take 1,000 mcg by mouth daily.  . [DISCONTINUED] ASTRAGALUS PO Take 20 mg by mouth daily.   . [DISCONTINUED] dexamethasone (DECADRON) 4 MG tablet Take 10 tablets (40 mg) on days 1, 8, and 15 of chemo. Repeat every 21 days.   . [DISCONTINUED] lenalidomide (REVLIMID) 25 MG capsule Take one capsule daily on days 1-14 every 21 days.   Marland Kitchen lidocaine-prilocaine (EMLA) cream Apply to affected area once (Patient not taking: Reported on 03/15/2017)   . LORazepam (ATIVAN) 0.5 MG tablet Take 1 tablet (0.5 mg total) by mouth every 6 (six) hours as needed (Nausea or vomiting). (Patient not taking: Reported on 03/15/2017)   . ondansetron  (ZOFRAN) 8 MG tablet Take 1 tablet (8 mg total) by mouth 2 (two) times daily as needed (Nausea or vomiting). (Patient not taking: Reported on 03/15/2017)   . prochlorperazine (COMPAZINE) 10 MG tablet Take 1 tablet (10 mg total) by mouth every 6 (six) hours as needed (Nausea or vomiting). (Patient not taking: Reported on 03/15/2017)    No facility-administered encounter medications on file as of 03/15/2017.     ALLERGIES:  No Known Allergies   PHYSICAL EXAM:  ECOG Performance status: 1 - Symptomatic, but largely independent.   Vitals:   03/15/17 0937  BP: 109/64  Pulse: 69  Resp: 16  Temp: 98 F (36.7 C)   Filed Weights   03/15/17 0937  Weight: 208 lb (94.3 kg)    Physical Exam  Constitutional: He is oriented to person, place, and time and well-developed, well-nourished, and in no distress.  HENT:  Head: Normocephalic.  Mouth/Throat: Oropharynx is clear and moist. No oropharyngeal exudate.  Eyes: Conjunctivae are normal. Pupils are equal, round, and reactive to light. No scleral icterus.  Neck: Normal range of motion. Neck supple.  Cardiovascular: Normal rate, regular rhythm and normal heart sounds.   Pulmonary/Chest: Effort normal and breath sounds normal. No respiratory distress.  Abdominal: Soft. Bowel sounds are normal. There is no tenderness.  Musculoskeletal: Normal range of motion. He exhibits no edema.  Lymphadenopathy:    He has no cervical adenopathy.    He has no axillary adenopathy.       Right: No supraclavicular adenopathy present.       Left: No supraclavicular adenopathy present.  Neurological: He is alert and oriented to person, place, and time. No cranial nerve deficit. Gait normal.  Skin: Skin is warm and dry. Rash (Very mild rash noted to abdomen and lower back with associated excoriation from recent scratching; skin very dry. ) noted.  Psychiatric: Mood, memory, affect and judgment normal.  Nursing note and vitals reviewed.    LABORATORY DATA:  I  have reviewed the labs as listed.  CBC    Component Value Date/Time   WBC 4.9 03/15/2017 0854   RBC 2.84 (L) 03/15/2017 0854   HGB 9.0 (L)  03/15/2017 0854   HCT 27.9 (L) 03/15/2017 0854   PLT 139 (L) 03/15/2017 0854   MCV 98.2 03/15/2017 0854   MCH 31.7 03/15/2017 0854   MCHC 32.3 03/15/2017 0854   RDW 13.8 03/15/2017 0854   LYMPHSABS 0.8 03/15/2017 0854   MONOABS 0.4 03/15/2017 0854   EOSABS 0.2 03/15/2017 0854   BASOSABS 0.0 03/15/2017 0854   CMP Latest Ref Rng & Units 03/15/2017 03/08/2017 03/01/2017  Glucose 65 - 99 mg/dL 115(H) 116(H) 105(H)  BUN 6 - 20 mg/dL _0 Creatinine 0.61 - 1.24 mg/dL 1.37(H) 1.45(H) 1.39(H)  Sodium 135 - 145 mmol/L 134(L) 135 133(L)  Potassium 3.5 - 5.1 mmol/L 3.7 4.3 4.2  Chloride 101 - 111 mmol/L 105 105 103  CO2 22 - 32 mmol/L _1 Calcium 8.9 - 10.3 mg/dL 8.8(L) 9.3 9.6  Total Protein 6.5 - 8.1 g/dL 7.2 8.3(H) 9.0(H)  Total Bilirubin 0.3 - 1.2 mg/dL 0.5 0.4 0.4  Alkaline Phos 38 - 126 U/L 26(L) 25(L) 25(L)  AST 15 - 41 U/L 24 42(H) 27  ALT 17 - 63 U/L 38 47 21    PENDING LABS:    DIAGNOSTIC IMAGING:  PET scan: 12/31/16     PATHOLOGY:  Genetics report: 01/22/14    Bone marrow biopsy: 02/02/17   Cytogenetics report: 02/10/17    ASSESSMENT & PLAN:   IgG kappa multiple myeloma:  -SPEP in 12/2016 revealed M-spike 2.3% with elevated IgG monoclonal protein (3,255 mg/dL) and elevated kappa light chains (2,129.1 mg/L) & kappa/lambda light chain ratio (272.96).  -Bone marrow biopsy on 02/02/17 demonstrated multiple myeloma; evidence of large peritravecular aggregates with kappa restricted plasma cells (66% by aspirate, 30% by Cd138). Cytogenetics +11.   -SPEP/IFE pending for today. We will notify patient with results.  -Started Revlimid/Velcade/Decadron ([facility]) on 03/01/17.  Patient and his wife are confused about his Revlimid schedule.  New treatment calendars printed for patient and reviewed with patient and his wife in detail with  nurse.  -Reviewed multiple myeloma recommendations. Discussed multiple myeloma diagnosis and monitoring of treatment with SPEP going forward. He understands our goal will be to gain measurable control of the myeloma for him to proceed to stem cell transplant.  We talked quite a bit about stem cell transplant process as well.  They are considering [facility] vs [facility] for the transplant. Today, they were were given patient education materials from the [facility], as well as the NCCN Guidelines Patient Packet for myeloma.  -Continue Revlimid/Velcade/Decadron as scheduled.  -Return to cancer center in 1 month for continued follow-up with repeat SPEP/IFE; orders placed.   Anemia/Mild thrombocytopenia:  -Likely secondary to treatment. Platelet count 139,000 today. No need for dose-reduction in treatment regimen; we will continue to monitor.  -Anemia largely stable; hemoglobin 9.0 today.   Bone mets:  -Xgeva monthly. Corrected calcium 8.9 mg/dL today. Delton See given today by nursing.   Viral prophylaxis:  -Continue Acyclovir.   Mild rash noted to trunk/low back:  -Present prior to Revlimid/Velcade. Clinically, does not appear to be a drug rash, but more likely secondary to dry skin. Recommended he apply OTC lotions/ointments with vitamin E liberally.    Male left breast cancer:  -Apparently patient has not had mammogram since he was diagnosed with breast cancer several years ago. I will make arrangements for him to get mammogram done here at [facility] sometime within the next month.  Unclear if he truly has bone mets from breast cancer vs smoldering  myeloma at the time of breast cancer diagnosis.  -Genetics report dated 01/11/14: Variant of Unknown Significance, "[id number]."  -Continue Tamoxifen.  -He will need breast exam at next follow-up visit and ensure subsequent mammograms are ordered/scheduled.    Dispo:  -Return as scheduled for treatment.  -Mammogram sometime within the next 1  month; orders placed today.  -Return to cancer center for follow-up in 1 month with repeat SPEP/IFE; orders placed today.    All questions were answered to patient's stated satisfaction. Encouraged patient to call with any new concerns or questions before his next visit to the cancer center and we can certain see him sooner, if needed.      Orders placed this encounter:  Orders Placed This Encounter  Procedures  . MM DIAG BREAST TOMO BILATERAL  . CBC with Differential/Platelet  . Comprehensive metabolic panel  . IgG, IgA, IgM  . Immunofixation electrophoresis  . Protein electrophoresis, serum      Mike Craze, NP [place] [phone]

## 2017-03-15 NOTE — Progress Notes (Signed)
Corrected calcium 9.28 per B.[place].  Johnny Lucas presents today for injection per MD orders. Xgeva 120 mg administered SQ in right Abdomen. Administration without incident. Patient tolerated well.  Johnny Lucas presents today for injection per MD orders. Velcade administered SQ in left Abdomen. Administration without incident. Patient tolerated well.  Patient stable and ambulatory on discharge home with wife.

## 2017-03-15 NOTE — Patient Instructions (Addendum)
[place] at [facility] Discharge Instructions  RECOMMENDATIONS MADE BY THE CONSULTANT AND ANY TEST RESULTS WILL BE SENT TO YOUR REFERRING PHYSICIAN.  You were seen today by Mike Craze NP. Continue xgeva and velcade. Return in 1 months for labs and follow up.   Thank you for choosing [place] at [facility] to provide your oncology and hematology care.  To afford each patient quality time with our provider, please arrive at least 15 minutes before your scheduled appointment time.    If you have a lab appointment with the [facility] please come in thru the  Main Entrance and check in at the main information desk  You need to re-schedule your appointment should you arrive 10 or more minutes late.  We strive to give you quality time with our providers, and arriving late affects you and other patients whose appointments are after yours.  Also, if you no show three or more times for appointments you may be dismissed from the clinic at the providers discretion.     Again, thank you for choosing [facility].  Our hope is that these requests will decrease the amount of time that you wait before being seen by our physicians.       _____________________________________________________________  Should you have questions after your visit to [facility], please contact our office at [phone] between the hours of 8:30 a.m. and 4:30 p.m.  Voicemails left after 4:30 p.m. will not be returned until the following business day.  For prescription refill requests, have your pharmacy contact our office.       Resources For Cancer Patients and their Caregivers ? American Cancer Society: Can assist with transportation, wigs, general needs, runs Look Good Feel Better.        901-222-4900 ? Cancer Care: Provides financial assistance, online support groups, medication/co-pay assistance.  1-800-813-HOPE 216-497-8155) ? [place] Assists [place] cancer patients and their families through emotional , educational and financial support.  (337)706-6422 ? [place] DSS Where to apply for food stamps, Medicaid and utility assistance. 218-067-3080 ? RCATS: Transportation to medical appointments. 604-013-6281 ? Social Security Administration: May apply for disability if have a Stage IV cancer. (312) [phone] ? [employer], Disability and Transit Services: Assists with nutrition, care and transit needs. [place] Support Programs: @10RELATIVEDAYS @ > Cancer Support Group  2nd Tuesday of the month 1pm-2pm, Journey Room  > Creative Journey  3rd Tuesday of the month 1130am-1pm, Journey Room  > Look Good Feel Better  1st Wednesday of the month 10am-12 noon, Journey Room (Call [facility] to register [phone])

## 2017-03-16 ENCOUNTER — Telehealth (HOSPITAL_COMMUNITY): Payer: Self-pay | Admitting: *Deleted

## 2017-03-16 LAB — PROTEIN ELECTROPHORESIS, SERUM
A/G RATIO SPE: 0.9 (ref 0.7–1.7)
ALPHA-1-GLOBULIN: 0.2 g/dL (ref 0.0–0.4)
ALPHA-2-GLOBULIN: 0.6 g/dL (ref 0.4–1.0)
Albumin ELP: 3.3 g/dL (ref 2.9–4.4)
Beta Globulin: 0.8 g/dL (ref 0.7–1.3)
GLOBULIN, TOTAL: 3.7 g/dL (ref 2.2–3.9)
Gamma Globulin: 2 g/dL — ABNORMAL HIGH (ref 0.4–1.8)
M-SPIKE, %: 1.7 g/dL — AB
TOTAL PROTEIN ELP: 7 g/dL (ref 6.0–8.5)

## 2017-03-16 LAB — IGG, IGA, IGM
IGA: 68 mg/dL (ref 61–437)
IGG (IMMUNOGLOBIN G), SERUM: 2012 mg/dL — AB (ref 700–1600)
IgM, Serum: 18 mg/dL — ABNORMAL LOW (ref 20–172)

## 2017-03-16 LAB — BETA 2 MICROGLOBULIN, SERUM: Beta-2 Microglobulin: 4.1 mg/L — ABNORMAL HIGH (ref 0.6–2.4)

## 2017-03-16 LAB — KAPPA/LAMBDA LIGHT CHAINS
KAPPA, LAMDA LIGHT CHAIN RATIO: 141.49 — AB (ref 0.26–1.65)
Kappa free light chain: 1231 mg/L — ABNORMAL HIGH (ref 3.3–19.4)
LAMDA FREE LIGHT CHAINS: 8.7 mg/L (ref 5.7–26.3)

## 2017-03-16 NOTE — Telephone Encounter (Signed)
-----   Message from Holley Bouche, NP sent at 03/16/2017  7:01 AM EDT ----- Regarding: One last thing... So sorry to bug you with this!   Do you mind helping me call Johnny Lucas to see when his last mammogram was?  He has a history of male breast cancer and was apparently getting his mammograms at [facility], but of course I can't see the results.  I just want to make sure he's getting them done.   Thank you boo!  gwd

## 2017-03-16 NOTE — Addendum Note (Signed)
Addended by: Holley Bouche on: 03/16/2017 02:29 PM   Modules accepted: Orders

## 2017-03-17 NOTE — Telephone Encounter (Signed)
Pt aware of Mammogram appointment.

## 2017-03-19 ENCOUNTER — Ambulatory Visit (HOSPITAL_COMMUNITY): Payer: Medicare Other | Admitting: Oncology

## 2017-03-22 ENCOUNTER — Other Ambulatory Visit (HOSPITAL_COMMUNITY): Payer: Medicare Other

## 2017-03-22 ENCOUNTER — Encounter (HOSPITAL_COMMUNITY): Payer: Medicare Other

## 2017-03-22 ENCOUNTER — Other Ambulatory Visit: Payer: Self-pay | Admitting: Adult Health

## 2017-03-22 ENCOUNTER — Encounter (HOSPITAL_BASED_OUTPATIENT_CLINIC_OR_DEPARTMENT_OTHER): Payer: Medicare Other

## 2017-03-22 ENCOUNTER — Encounter (HOSPITAL_COMMUNITY): Payer: Self-pay

## 2017-03-22 VITALS — BP 110/71 | HR 65 | Temp 97.9°F | Resp 18 | Wt 208.0 lb

## 2017-03-22 DIAGNOSIS — C9000 Multiple myeloma not having achieved remission: Secondary | ICD-10-CM

## 2017-03-22 DIAGNOSIS — Z853 Personal history of malignant neoplasm of breast: Secondary | ICD-10-CM

## 2017-03-22 DIAGNOSIS — C7951 Secondary malignant neoplasm of bone: Secondary | ICD-10-CM | POA: Diagnosis not present

## 2017-03-22 DIAGNOSIS — Z5112 Encounter for antineoplastic immunotherapy: Secondary | ICD-10-CM | POA: Diagnosis present

## 2017-03-22 DIAGNOSIS — Z170 Estrogen receptor positive status [ER+]: Secondary | ICD-10-CM | POA: Diagnosis not present

## 2017-03-22 DIAGNOSIS — C50922 Malignant neoplasm of unspecified site of left male breast: Secondary | ICD-10-CM | POA: Diagnosis not present

## 2017-03-22 LAB — COMPREHENSIVE METABOLIC PANEL
ALBUMIN: 3.4 g/dL — AB (ref 3.5–5.0)
ALK PHOS: 27 U/L — AB (ref 38–126)
ALT: 31 U/L (ref 17–63)
ANION GAP: 4 — AB (ref 5–15)
AST: 26 U/L (ref 15–41)
BUN: 17 mg/dL (ref 6–20)
CO2: 23 mmol/L (ref 22–32)
Calcium: 8.8 mg/dL — ABNORMAL LOW (ref 8.9–10.3)
Chloride: 107 mmol/L (ref 101–111)
Creatinine, Ser: 1.39 mg/dL — ABNORMAL HIGH (ref 0.61–1.24)
GFR calc Af Amer: 59 mL/min — ABNORMAL LOW (ref >60)
GFR calc non Af Amer: 51 mL/min — ABNORMAL LOW (ref >60)
GLUCOSE: 121 mg/dL — AB (ref 65–99)
POTASSIUM: 4.2 mmol/L (ref 3.5–5.1)
SODIUM: 134 mmol/L — AB (ref 135–145)
Total Bilirubin: 0.6 mg/dL (ref 0.3–1.2)
Total Protein: 7.1 g/dL (ref 6.5–8.1)

## 2017-03-22 LAB — CBC WITH DIFFERENTIAL/PLATELET
BASOS ABS: 0 10*3/uL (ref 0.0–0.1)
Basophils Relative: 0 %
Eosinophils Absolute: 0 10*3/uL (ref 0.0–0.7)
Eosinophils Relative: 0 %
HEMATOCRIT: 28.6 % — AB (ref 39.0–52.0)
HEMOGLOBIN: 9.6 g/dL — AB (ref 13.0–17.0)
LYMPHS ABS: 0.5 10*3/uL — AB (ref 0.7–4.0)
Lymphocytes Relative: 16 %
MCH: 32.3 pg (ref 26.0–34.0)
MCHC: 33.6 g/dL (ref 30.0–36.0)
MCV: 96.3 fL (ref 78.0–100.0)
MONOS PCT: 4 %
Monocytes Absolute: 0.1 10*3/uL (ref 0.1–1.0)
NEUTROS ABS: 2.5 10*3/uL (ref 1.7–7.7)
NEUTROS PCT: 80 %
Platelets: 131 10*3/uL — ABNORMAL LOW (ref 150–400)
RBC: 2.97 MIL/uL — ABNORMAL LOW (ref 4.22–5.81)
RDW: 14.4 % (ref 11.5–15.5)
WBC: 3.1 10*3/uL — AB (ref 4.0–10.5)

## 2017-03-22 MED ORDER — PROCHLORPERAZINE MALEATE 10 MG PO TABS
10.0000 mg | ORAL_TABLET | Freq: Once | ORAL | Status: AC
  Administered 2017-03-22: 10 mg via ORAL

## 2017-03-22 MED ORDER — PROCHLORPERAZINE MALEATE 10 MG PO TABS
ORAL_TABLET | ORAL | Status: AC
  Filled 2017-03-22: qty 1

## 2017-03-22 MED ORDER — BORTEZOMIB CHEMO SQ INJECTION 3.5 MG (2.5MG/ML)
1.3000 mg/m2 | Freq: Once | INTRAMUSCULAR | Status: AC
  Administered 2017-03-22: 2.75 mg via SUBCUTANEOUS
  Filled 2017-03-22: qty 2.75

## 2017-03-22 NOTE — Progress Notes (Signed)
[name] presents today for injection per MD orders. Velcade administered SQ in right Abdomen. Administration without incident. Patient tolerated well.

## 2017-03-22 NOTE — Patient Instructions (Signed)
[facility] Discharge Instructions for Patients Receiving Chemotherapy   Beginning January 23rd 2017 lab work for the [facility] will be done in the  Main lab at [facility] on 1st floor. If you have a lab appointment with the [facility] please come in thru the  Main Entrance and check in at the main information desk   Today you received the following chemotherapy agent: Velcade.     If you develop nausea and vomiting, or diarrhea that is not controlled by your medication, call the clinic.  The clinic phone number is [phone]. Office hours are Monday-Friday 8:30am-5:00pm.  BELOW ARE SYMPTOMS THAT SHOULD BE REPORTED IMMEDIATELY:  *FEVER GREATER THAN 101.0 F  *CHILLS WITH OR WITHOUT FEVER  NAUSEA AND VOMITING THAT IS NOT CONTROLLED WITH YOUR NAUSEA MEDICATION  *UNUSUAL SHORTNESS OF BREATH  *UNUSUAL BRUISING OR BLEEDING  TENDERNESS IN MOUTH AND THROAT WITH OR WITHOUT PRESENCE OF ULCERS  *URINARY PROBLEMS  *BOWEL PROBLEMS  UNUSUAL RASH Items with * indicate a potential emergency and should be followed up as soon as possible. If you have an emergency after office hours please contact your primary care physician or go to the nearest emergency department.  Please call the clinic during office hours if you have any questions or concerns.   You may also contact the Patient Navigator at [phone] should you have any questions or need assistance in obtaining follow up care.      Resources For Cancer Patients and their Caregivers ? American Cancer Society: Can assist with transportation, wigs, general needs, runs Look Good Feel Better.        930-094-0709 ? Cancer Care: Provides financial assistance, online support groups, medication/co-pay assistance.  1-800-813-HOPE 850-525-5896) ? [place] Assists [place] cancer patients and their families through emotional , educational and financial support.   870-278-4219 ? [place] DSS Where to apply for food stamps, Medicaid and utility assistance. 705-614-3497 ? RCATS: Transportation to medical appointments. 773-543-4444 ? Social Security Administration: May apply for disability if have a Stage IV cancer. 570-093-1809 402-747-4575 ? [employer], Disability and Transit Services: Assists with nutrition, care and transit needs. [phone]

## 2017-03-29 ENCOUNTER — Encounter (HOSPITAL_COMMUNITY): Payer: Medicare Other

## 2017-03-29 ENCOUNTER — Encounter (HOSPITAL_COMMUNITY): Payer: Medicare Other | Attending: Oncology

## 2017-03-29 ENCOUNTER — Other Ambulatory Visit (HOSPITAL_COMMUNITY): Payer: Self-pay | Admitting: Oncology

## 2017-03-29 VITALS — BP 107/72 | HR 74 | Temp 98.5°F | Resp 20 | Wt 210.0 lb

## 2017-03-29 DIAGNOSIS — C9000 Multiple myeloma not having achieved remission: Secondary | ICD-10-CM

## 2017-03-29 DIAGNOSIS — Z170 Estrogen receptor positive status [ER+]: Secondary | ICD-10-CM | POA: Insufficient documentation

## 2017-03-29 DIAGNOSIS — C50922 Malignant neoplasm of unspecified site of left male breast: Secondary | ICD-10-CM | POA: Diagnosis not present

## 2017-03-29 DIAGNOSIS — Z5112 Encounter for antineoplastic immunotherapy: Secondary | ICD-10-CM | POA: Diagnosis present

## 2017-03-29 DIAGNOSIS — C7951 Secondary malignant neoplasm of bone: Secondary | ICD-10-CM | POA: Insufficient documentation

## 2017-03-29 LAB — CBC WITH DIFFERENTIAL/PLATELET
BASOS ABS: 0 10*3/uL (ref 0.0–0.1)
BASOS PCT: 0 %
EOS ABS: 0 10*3/uL (ref 0.0–0.7)
EOS PCT: 0 %
HCT: 27.2 % — ABNORMAL LOW (ref 39.0–52.0)
Hemoglobin: 8.9 g/dL — ABNORMAL LOW (ref 13.0–17.0)
LYMPHS PCT: 10 %
Lymphs Abs: 0.4 10*3/uL — ABNORMAL LOW (ref 0.7–4.0)
MCH: 32.2 pg (ref 26.0–34.0)
MCHC: 32.7 g/dL (ref 30.0–36.0)
MCV: 98.6 fL (ref 78.0–100.0)
Monocytes Absolute: 0 10*3/uL — ABNORMAL LOW (ref 0.1–1.0)
Monocytes Relative: 1 %
Neutro Abs: 3.5 10*3/uL (ref 1.7–7.7)
Neutrophils Relative %: 89 %
PLATELETS: 105 10*3/uL — AB (ref 150–400)
RBC: 2.76 MIL/uL — AB (ref 4.22–5.81)
RDW: 14.7 % (ref 11.5–15.5)
WBC: 3.9 10*3/uL — AB (ref 4.0–10.5)

## 2017-03-29 LAB — COMPREHENSIVE METABOLIC PANEL
ALBUMIN: 3.4 g/dL — AB (ref 3.5–5.0)
ALT: 29 U/L (ref 17–63)
AST: 31 U/L (ref 15–41)
Alkaline Phosphatase: 25 U/L — ABNORMAL LOW (ref 38–126)
Anion gap: 4 — ABNORMAL LOW (ref 5–15)
BUN: 14 mg/dL (ref 6–20)
CHLORIDE: 103 mmol/L (ref 101–111)
CO2: 23 mmol/L (ref 22–32)
CREATININE: 1.41 mg/dL — AB (ref 0.61–1.24)
Calcium: 8.5 mg/dL — ABNORMAL LOW (ref 8.9–10.3)
GFR calc Af Amer: 58 mL/min — ABNORMAL LOW (ref >60)
GFR calc non Af Amer: 50 mL/min — ABNORMAL LOW (ref >60)
Glucose, Bld: 168 mg/dL — ABNORMAL HIGH (ref 65–99)
Potassium: 4.4 mmol/L (ref 3.5–5.1)
SODIUM: 130 mmol/L — AB (ref 135–145)
Total Bilirubin: 0.6 mg/dL (ref 0.3–1.2)
Total Protein: 6.9 g/dL (ref 6.5–8.1)

## 2017-03-29 MED ORDER — PROCHLORPERAZINE MALEATE 10 MG PO TABS
ORAL_TABLET | ORAL | Status: AC
  Filled 2017-03-29: qty 1

## 2017-03-29 MED ORDER — CALCIUM CARB-CHOLECALCIFEROL 500-600 MG-UNIT PO TABS: 2.0000 | ORAL_TABLET | Freq: Every day | ORAL | 6 refills | Status: DC

## 2017-03-29 MED ORDER — LENALIDOMIDE 25 MG PO CAPS: ORAL_CAPSULE | ORAL | 0 refills | Status: DC

## 2017-03-29 MED ORDER — BORTEZOMIB CHEMO SQ INJECTION 3.5 MG (2.5MG/ML)
1.3000 mg/m2 | Freq: Once | INTRAMUSCULAR | Status: AC
  Administered 2017-03-29: 2.75 mg via SUBCUTANEOUS
  Filled 2017-03-29: qty 2.75

## 2017-03-29 MED ORDER — PROCHLORPERAZINE MALEATE 10 MG PO TABS
10.0000 mg | ORAL_TABLET | Freq: Once | ORAL | Status: AC
Start: 2017-03-29 — End: 2017-03-29
  Administered 2017-03-29: 10 mg via ORAL

## 2017-03-29 NOTE — Progress Notes (Signed)
Pt currently taking Revlimid and Dexamethasone. Reports that he has not missed any doses of Revlimid or Dexamethasone but does report weakness, change in taste, and some dizziness. Orthostatic vitals taken. Tom notified. Patient instructed to changed positions slowly to decrease the dizziness. Patient instructed to take 2 calcium tablets and 3 tums per day to help increase blood calcium levels. Patient verbalized understanding of all instructions. Patient asked for [place] to schedule XGEVA on days separate from chemo injection due to cancer policy. I will ask Amy to schedule him on different days. Copy of labs given to patient.

## 2017-03-29 NOTE — Progress Notes (Signed)
Patient reports red area at site of last velcade injection. Noted area to right lower abdomen of red induration 5 cm x 8 cm. Patient reports area was slightly itchy at first. Denies any blisters. No other areas noted.  Johnny Lucas presents today for injection per MD orders. Velcade administered SQ in left Abdomen. Administration without incident. Patient tolerated well.

## 2017-03-29 NOTE — Patient Instructions (Signed)
[facility] Discharge Instructions for Patients Receiving Chemotherapy   Beginning January 23rd 2017 lab work for the [facility] will be done in the  Main lab at [facility] on 1st floor. If you have a lab appointment with the [place] please come in thru the  Main Entrance and check in at the main information desk   Today you received the following chemotherapy agents velcade injection.  If you develop nausea and vomiting, or diarrhea that is not controlled by your medication, call the clinic.  The clinic phone number is [phone]. Office hours are Monday-Friday 8:30am-5:00pm.  BELOW ARE SYMPTOMS THAT SHOULD BE REPORTED IMMEDIATELY:  *FEVER GREATER THAN 101.0 F  *CHILLS WITH OR WITHOUT FEVER  NAUSEA AND VOMITING THAT IS NOT CONTROLLED WITH YOUR NAUSEA MEDICATION  *UNUSUAL SHORTNESS OF BREATH  *UNUSUAL BRUISING OR BLEEDING  TENDERNESS IN MOUTH AND THROAT WITH OR WITHOUT PRESENCE OF ULCERS  *URINARY PROBLEMS  *BOWEL PROBLEMS  UNUSUAL RASH Items with * indicate a potential emergency and should be followed up as soon as possible. If you have an emergency after office hours please contact your primary care physician or go to the nearest emergency department.  Please call the clinic during office hours if you have any questions or concerns.   You may also contact the Patient Navigator at [phone] should you have any questions or need assistance in obtaining follow up care.      Resources For Cancer Patients and their Caregivers ? American Cancer Society: Can assist with transportation, wigs, general needs, runs Look Good Feel Better.        (505)666-6630 ? Cancer Care: Provides financial assistance, online support groups, medication/co-pay assistance.  1-800-813-HOPE (567) 258-3059) ? [place] Assists [place] cancer patients and their families through emotional , educational and financial support.   806-621-6545 ? [place] DSS Where to apply for food stamps, Medicaid and utility assistance. 510-842-3361 ? [facility]: Transportation to medical appointments. 234-106-9803 ? Social Security Administration: May apply for disability if have a Stage IV cancer. 843-185-4051 458-770-8470 ? [employer], Disability and Transit Services: Assists with nutrition, care and transit needs. ([phone] [phone]

## 2017-03-30 ENCOUNTER — Encounter (HOSPITAL_COMMUNITY): Payer: Medicare Other

## 2017-04-01 ENCOUNTER — Telehealth (HOSPITAL_COMMUNITY): Payer: Self-pay

## 2017-04-01 NOTE — Telephone Encounter (Signed)
Patients wife called stating patient had a rash again from his velcade injection this past Monday. The rash came up on Tuesday. She states the rash is half the size it was last time. It is red and itching but no blisters. They have been using hydrocortisone cream to treat it. Reviewed with Dr. Talbert Cage who said to keep using the hydrocortisone cream. He may have a reaction every time. Called wife back and explained what Dr. Talbert Cage said with understanding verbalized.

## 2017-04-05 ENCOUNTER — Encounter (HOSPITAL_COMMUNITY): Payer: Medicare Other

## 2017-04-05 ENCOUNTER — Encounter (HOSPITAL_BASED_OUTPATIENT_CLINIC_OR_DEPARTMENT_OTHER): Payer: Medicare Other

## 2017-04-05 ENCOUNTER — Encounter (HOSPITAL_COMMUNITY): Payer: Self-pay

## 2017-04-05 VITALS — BP 105/57 | HR 67 | Temp 98.6°F | Resp 18 | Wt 209.6 lb

## 2017-04-05 DIAGNOSIS — Z170 Estrogen receptor positive status [ER+]: Secondary | ICD-10-CM | POA: Diagnosis not present

## 2017-04-05 DIAGNOSIS — Z5112 Encounter for antineoplastic immunotherapy: Secondary | ICD-10-CM | POA: Diagnosis present

## 2017-04-05 DIAGNOSIS — C9000 Multiple myeloma not having achieved remission: Secondary | ICD-10-CM

## 2017-04-05 DIAGNOSIS — C7951 Secondary malignant neoplasm of bone: Secondary | ICD-10-CM | POA: Diagnosis not present

## 2017-04-05 DIAGNOSIS — C50922 Malignant neoplasm of unspecified site of left male breast: Secondary | ICD-10-CM | POA: Diagnosis not present

## 2017-04-05 LAB — COMPREHENSIVE METABOLIC PANEL
ALBUMIN: 3.5 g/dL (ref 3.5–5.0)
ALT: 31 U/L (ref 17–63)
AST: 29 U/L (ref 15–41)
Alkaline Phosphatase: 28 U/L — ABNORMAL LOW (ref 38–126)
Anion gap: 7 (ref 5–15)
BUN: 19 mg/dL (ref 6–20)
CHLORIDE: 104 mmol/L (ref 101–111)
CO2: 23 mmol/L (ref 22–32)
Calcium: 8.8 mg/dL — ABNORMAL LOW (ref 8.9–10.3)
Creatinine, Ser: 1.45 mg/dL — ABNORMAL HIGH (ref 0.61–1.24)
GFR calc Af Amer: 56 mL/min — ABNORMAL LOW (ref >60)
GFR calc non Af Amer: 48 mL/min — ABNORMAL LOW (ref >60)
GLUCOSE: 147 mg/dL — AB (ref 65–99)
Potassium: 4.2 mmol/L (ref 3.5–5.1)
Sodium: 134 mmol/L — ABNORMAL LOW (ref 135–145)
Total Bilirubin: 0.3 mg/dL (ref 0.3–1.2)
Total Protein: 7 g/dL (ref 6.5–8.1)

## 2017-04-05 LAB — CBC WITH DIFFERENTIAL/PLATELET
BASOS ABS: 0 10*3/uL (ref 0.0–0.1)
Basophils Relative: 1 %
EOS ABS: 0 10*3/uL (ref 0.0–0.7)
Eosinophils Relative: 1 %
HCT: 29.1 % — ABNORMAL LOW (ref 39.0–52.0)
Hemoglobin: 9.8 g/dL — ABNORMAL LOW (ref 13.0–17.0)
LYMPHS ABS: 0.5 10*3/uL — AB (ref 0.7–4.0)
Lymphocytes Relative: 14 %
MCH: 32.9 pg (ref 26.0–34.0)
MCHC: 33.7 g/dL (ref 30.0–36.0)
MCV: 97.7 fL (ref 78.0–100.0)
Monocytes Absolute: 0.1 10*3/uL (ref 0.1–1.0)
Monocytes Relative: 2 %
NEUTROS ABS: 3.2 10*3/uL (ref 1.7–7.7)
Neutrophils Relative %: 82 %
PLATELETS: 92 10*3/uL — AB (ref 150–400)
RBC: 2.98 MIL/uL — ABNORMAL LOW (ref 4.22–5.81)
RDW: 15.5 % (ref 11.5–15.5)
WBC: 3.8 10*3/uL — ABNORMAL LOW (ref 4.0–10.5)

## 2017-04-05 MED ORDER — PROCHLORPERAZINE MALEATE 10 MG PO TABS
10.0000 mg | ORAL_TABLET | Freq: Once | ORAL | Status: AC
  Administered 2017-04-05: 10 mg via ORAL
  Filled 2017-04-05: qty 1

## 2017-04-05 MED ORDER — BORTEZOMIB CHEMO SQ INJECTION 3.5 MG (2.5MG/ML)
1.3000 mg/m2 | Freq: Once | INTRAMUSCULAR | Status: AC
  Administered 2017-04-05: 2.75 mg via SUBCUTANEOUS
  Filled 2017-04-05: qty 2.75

## 2017-04-05 NOTE — Progress Notes (Signed)
1400 Labs including Platelets of 92 reviewed with Dr. Talbert Cage as well as site inspection from last weeks injection and pt was approved for Velcade injection today per MD.Pt again experienced redness around last injection site on his left lower abdominal region with small red patches tracking above and below the larger area of redness.Dr Talbert Cage assessed these areas and pt to continue to apply Hydrocortisone cream. The previous site from 2 weeks ago on the right side is fading.Pt instructed to call if these areas get worse and verbalized understanding. Pt discharged self ambulatory in satisfactory condition accompanied by his wife

## 2017-04-05 NOTE — Patient Instructions (Signed)
[facility] Discharge Instructions for Patients Receiving Chemotherapy   Beginning January 23rd 2017 lab work for the Cancer Center will be done in the  Main lab at [place] on 1st floor. If you have a lab appointment with the Cancer Center please come in thru the  Main Entrance and check in at the main information desk   Today you received the following chemotherapy agents Velcade injection. Follow-up as scheduled. Call clinic for any questions or concerns  To help prevent nausea and vomiting after your treatment, we encourage you to take your nausea medication   If you develop nausea and vomiting, or diarrhea that is not controlled by your medication, call the clinic.  The clinic phone number is [phone]. Office hours are Monday-Friday 8:30am-5:00pm.  BELOW ARE SYMPTOMS THAT SHOULD BE REPORTED IMMEDIATELY:  *FEVER GREATER THAN 101.0 F  *CHILLS WITH OR WITHOUT FEVER  NAUSEA AND VOMITING THAT IS NOT CONTROLLED WITH YOUR NAUSEA MEDICATION  *UNUSUAL SHORTNESS OF BREATH  *UNUSUAL BRUISING OR BLEEDING  TENDERNESS IN MOUTH AND THROAT WITH OR WITHOUT PRESENCE OF ULCERS  *URINARY PROBLEMS  *BOWEL PROBLEMS  UNUSUAL RASH Items with * indicate a potential emergency and should be followed up as soon as possible. If you have an emergency after office hours please contact your primary care physician or go to the nearest emergency department.  Please call the clinic during office hours if you have any questions or concerns.   You may also contact the Patient Navigator at [phone] should you have any questions or need assistance in obtaining follow up care.      Resources For Cancer Patients and their Caregivers ? American Cancer Society: Can assist with transportation, wigs, general needs, runs Look Good Feel Better.        1-888-227-6333 ? Cancer Care: Provides financial assistance, online support groups, medication/co-pay assistance.   1-800-813-HOPE (4673) ? [facility] Assists [place] cancer patients and their families through emotional , educational and financial support.  336-427-4357 ? [place] DSS Where to apply for food stamps, Medicaid and utility assistance. 336-342-1394 ? [facility]: Transportation to medical appointments. 336-347-2287 ? Social Security Administration: May apply for disability if have a Stage IV cancer. 336-342-7796 1-800-772-1213 ? [place] Aging, Disability and Transit Services: Assists with nutrition, care and transit needs. 336-349-2343         

## 2017-04-05 NOTE — Progress Notes (Signed)
[place] continues to take his Revlimid as prescribed without missing any doses and his only complaint is fatigue

## 2017-04-06 ENCOUNTER — Ambulatory Visit (HOSPITAL_COMMUNITY)
Admission: RE | Admit: 2017-04-06 | Discharge: 2017-04-06 | Disposition: A | Discharge status: Home / Self Care | Payer: Medicare Other | Source: Ambulatory Visit | Attending: Adult Health | Admitting: Adult Health

## 2017-04-06 DIAGNOSIS — Z170 Estrogen receptor positive status [ER+]: Secondary | ICD-10-CM | POA: Insufficient documentation

## 2017-04-06 DIAGNOSIS — C9000 Multiple myeloma not having achieved remission: Secondary | ICD-10-CM | POA: Diagnosis not present

## 2017-04-06 DIAGNOSIS — Z853 Personal history of malignant neoplasm of breast: Secondary | ICD-10-CM

## 2017-04-06 DIAGNOSIS — N6489 Other specified disorders of breast: Secondary | ICD-10-CM | POA: Diagnosis not present

## 2017-04-06 DIAGNOSIS — R928 Other abnormal and inconclusive findings on diagnostic imaging of breast: Secondary | ICD-10-CM | POA: Diagnosis not present

## 2017-04-06 DIAGNOSIS — N62 Hypertrophy of breast: Secondary | ICD-10-CM | POA: Insufficient documentation

## 2017-04-06 DIAGNOSIS — Z79810 Long term (current) use of selective estrogen receptor modulators (SERMs): Secondary | ICD-10-CM | POA: Insufficient documentation

## 2017-04-06 DIAGNOSIS — C50922 Malignant neoplasm of unspecified site of left male breast: Secondary | ICD-10-CM | POA: Diagnosis not present

## 2017-04-08 ENCOUNTER — Other Ambulatory Visit (HOSPITAL_COMMUNITY): Payer: Medicare Other

## 2017-04-08 ENCOUNTER — Ambulatory Visit (HOSPITAL_COMMUNITY): Payer: Medicare Other

## 2017-04-09 ENCOUNTER — Encounter (HOSPITAL_COMMUNITY): Payer: Self-pay | Admitting: Adult Health

## 2017-04-09 NOTE — Progress Notes (Signed)
I spoke with Dr. Lyndon Code with pathology re: Accession: [id number].    This was a humerus bone biopsy performed in 2011, which initially revealed metastatic carcinoma. We asked that Dr. Lyndon Code re-review the slides now in the setting of new multiple myeloma/plasmacytoma diagnosis and treatment.  We also asked that prognostic indicators be run on this bone sample to better understand if we are dealing with a metastatic breast cancer vs smoldering myeloma that would not have been clinically apparent back in 2011.  Of course, this information will help guide treatment decisions for this patient if he has metastatic breast cancer with bone mets vs smoldering myeloma with synchronous early stage breast cancer.      Dr. Lyndon Code performed staining on bone biopsy sample from 2011; it is strongly ER+, suggesting a breast primary.  This confirms metastatic breast cancer.  There was not enough sample to run PR/HER2.  Hematologic Pathology also reviewed the slide and they do not feel that this bone lesion represents a myeloma.      John[place] Craze, NP [place] [phone]

## 2017-04-12 ENCOUNTER — Encounter (HOSPITAL_COMMUNITY): Payer: Medicare Other

## 2017-04-12 ENCOUNTER — Encounter (HOSPITAL_COMMUNITY): Payer: Self-pay

## 2017-04-12 ENCOUNTER — Encounter (HOSPITAL_BASED_OUTPATIENT_CLINIC_OR_DEPARTMENT_OTHER): Payer: Medicare Other | Admitting: Oncology

## 2017-04-12 ENCOUNTER — Encounter (HOSPITAL_BASED_OUTPATIENT_CLINIC_OR_DEPARTMENT_OTHER): Payer: Medicare Other

## 2017-04-12 VITALS — BP 127/73 | HR 71 | Temp 98.9°F | Resp 18

## 2017-04-12 DIAGNOSIS — C9000 Multiple myeloma not having achieved remission: Secondary | ICD-10-CM

## 2017-04-12 DIAGNOSIS — C7951 Secondary malignant neoplasm of bone: Secondary | ICD-10-CM | POA: Diagnosis not present

## 2017-04-12 DIAGNOSIS — C50122 Malignant neoplasm of central portion of left male breast: Secondary | ICD-10-CM | POA: Diagnosis not present

## 2017-04-12 DIAGNOSIS — G629 Polyneuropathy, unspecified: Secondary | ICD-10-CM | POA: Diagnosis not present

## 2017-04-12 DIAGNOSIS — Z170 Estrogen receptor positive status [ER+]: Secondary | ICD-10-CM

## 2017-04-12 DIAGNOSIS — C50922 Malignant neoplasm of unspecified site of left male breast: Secondary | ICD-10-CM | POA: Diagnosis not present

## 2017-04-12 DIAGNOSIS — C50921 Malignant neoplasm of unspecified site of right male breast: Secondary | ICD-10-CM

## 2017-04-12 DIAGNOSIS — Z5112 Encounter for antineoplastic immunotherapy: Secondary | ICD-10-CM

## 2017-04-12 LAB — COMPREHENSIVE METABOLIC PANEL
ALT: 22 U/L (ref 17–63)
AST: 23 U/L (ref 15–41)
Albumin: 3.5 g/dL (ref 3.5–5.0)
Alkaline Phosphatase: 25 U/L — ABNORMAL LOW (ref 38–126)
Anion gap: 4 — ABNORMAL LOW (ref 5–15)
BUN: 15 mg/dL (ref 6–20)
CHLORIDE: 106 mmol/L (ref 101–111)
CO2: 25 mmol/L (ref 22–32)
Calcium: 9 mg/dL (ref 8.9–10.3)
Creatinine, Ser: 1.27 mg/dL — ABNORMAL HIGH (ref 0.61–1.24)
GFR, EST NON AFRICAN AMERICAN: 56 mL/min — AB (ref >60)
Glucose, Bld: 151 mg/dL — ABNORMAL HIGH (ref 65–99)
POTASSIUM: 4.1 mmol/L (ref 3.5–5.1)
SODIUM: 135 mmol/L (ref 135–145)
Total Bilirubin: 0.3 mg/dL (ref 0.3–1.2)
Total Protein: 7 g/dL (ref 6.5–8.1)

## 2017-04-12 LAB — CBC WITH DIFFERENTIAL/PLATELET
BASOS ABS: 0 10*3/uL (ref 0.0–0.1)
BASOS PCT: 1 %
EOS ABS: 0 10*3/uL (ref 0.0–0.7)
Eosinophils Relative: 1 %
HCT: 29.1 % — ABNORMAL LOW (ref 39.0–52.0)
Hemoglobin: 9.9 g/dL — ABNORMAL LOW (ref 13.0–17.0)
LYMPHS ABS: 0.5 10*3/uL — AB (ref 0.7–4.0)
Lymphocytes Relative: 20 %
MCH: 33 pg (ref 26.0–34.0)
MCHC: 34 g/dL (ref 30.0–36.0)
MCV: 97 fL (ref 78.0–100.0)
MONOS PCT: 3 %
Monocytes Absolute: 0.1 10*3/uL (ref 0.1–1.0)
NEUTROS ABS: 1.7 10*3/uL (ref 1.7–7.7)
NEUTROS PCT: 76 %
PLATELETS: 110 10*3/uL — AB (ref 150–400)
RBC: 3 MIL/uL — AB (ref 4.22–5.81)
RDW: 15.4 % (ref 11.5–15.5)
WBC: 2.3 10*3/uL — AB (ref 4.0–10.5)

## 2017-04-12 MED ORDER — PROCHLORPERAZINE MALEATE 10 MG PO TABS
10.0000 mg | ORAL_TABLET | Freq: Once | ORAL | Status: AC
  Administered 2017-04-12: 10 mg via ORAL
  Filled 2017-04-12: qty 1

## 2017-04-12 MED ORDER — BORTEZOMIB CHEMO SQ INJECTION 3.5 MG (2.5MG/ML)
1.3000 mg/m2 | Freq: Once | INTRAMUSCULAR | Status: AC
  Administered 2017-04-12: 2.75 mg via SUBCUTANEOUS
  Filled 2017-04-12: qty 2.75

## 2017-04-12 NOTE — Progress Notes (Signed)
[place] presents today for injection per the provider's orders.  Velcade administration without incident; see MAR for injection details.  Patient tolerated procedure well and without incident.  No questions or complaints noted at this time.  Discharged ambulatory in c/o spouse.

## 2017-04-12 NOTE — Progress Notes (Signed)
[place]  PROGRESS NOTE  Patient Care Team: Johnny Percy, MD as PCP - General (Family Medicine)  CHIEF COMPLAINTS/PURPOSE OF CONSULTATION:    Breast cancer, male ([place])   12/31/2009 Initial Biopsy    Biopsy of L breast       12/31/2009 Pathology Results    Invasive ductal carcinoma, ER/PR+, HER 2 negative      12/31/2009 Imaging    Ultrasound showing a 2.43 x 1.85 x 3 cm hypoechoic spiculated mass in the 12 o clock L breast retroareolar region      01/01/2010 -  Anti-estrogen oral therapy    Tamoxifen 20 mg daily      01/06/2010 Imaging    Bone scan abnormal uptake in the diaphysis of the R humerus, abnormal in the R third, fifth and sixth ribs, lesion also noted in the sternum.      02/03/2010 Surgery    Rod placement and fixation of R humerus by Dr. Amedeo [place]      02/05/2010 - 02/18/2010 Radiation Therapy    30Gy in 10 fractions of 3 Gy per fraction to R pathologic fracture      03/11/2010 -  Chemotherapy    Denosumab monthly, now every 3 months. Started at [place]       06/09/2016 Imaging    Three hypermetabolic osseous lesions in the sternum, left ilium and right ilium, as discussed above, likely represent osseous metastases. At this time, these are not recognizable on the CT images. 2. No extra skeletal metastatic disease identified in the neck, chest, abdomen or pelvis.      10/13/2016 Progression    PET shows various new and enlarging osseous metastatic lesions with no definite extra osseous metastatic disease currently identified.       12/31/2016 Progression    1. Multifocal hypermetabolic osseous metastases throughout the axial and proximal appendicular skeleton, which are increased in size, number and metabolism since 10/13/2016 PET-CT. 2. 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 3. No additional sites of hypermetabolic metastatic  disease. 4. Chronic right mastoid sinusitis. 5. Aortic atherosclerosis.  One vessel coronary atherosclerosis.       Multiple myeloma ([place])   02/12/2017 Bone Marrow Biopsy    The marrow was variably cellular with large peritrabecular aggregates of kappa restricted plasma cells (66% by aspirate, 30% by Cd138). Cytogenetics +11.        HISTORY OF PRESENTING ILLNESS:  [place] 71 y.o. male is here for ongoing follow-up of breast cancer and multiple myeloma.  He was diagnosed with breast cancer in 2011. He never had a mastectomy or radiation, but did have a lumpectomy and has been on Tamoxifen. He also had to "have a nail" put in his right arm. [place] said his arm was never biopsied, but an x-ray "revealed a black spot." He had radiation. He does have regular mammograms.  Patient has metastatic bone disease which was initially presumed to be metastatic disease from his breast cancer, however he was noted to have a IgG kappa monoclonal paraprotein.  Multiple myeloma work up on Jan 18 2017, demonstrated a SPEP with a monoclonal spike in the gamma region, 2.3 g/dL. Immunofixation showed an IgG monoclonal protein with kappa  light chains specificity with monoclonal light chains (Bence Jones protein) Free kappa light were 2,129, free gamma light 7.8, kappa, gamma light ratio was 272.96. Beta 2 micro-globulin was 3.3. CMP performed  today showed creatine 1.24, total protein 8.5 calcium level 9.1. Serum immunoglobulin taken Janurary 22,18 serum IgG 3225, serum IgA  97, serum IgM is 25.  Bone marrow biopsy and aspirate 02/02/2017 demonstrating multiple myeloma. The marrow was variably cellular with large peritrabecular aggregates of kappa restricted plasma cells (66% by aspirate, 30% by Cd138). Cytogenetics +11.  INTERVAL HISTORY: [name] presents to the clinic today accompanied by his wife. Hgb at 9.9 today. On review of systems, the patient reports his prior rash at the site of velcade injections  has mostly subsided. He reports his fatigue is decreasing and he feels almost back to normal. He denies any recent infections. He denies neuropathy in fingertips, but reports some very mild neuropathy in his toes. The patient reports a good appetite. The patient is continuing to take acyclovir to prevent shingles outbreak.    MEDICAL HISTORY:  Past Medical History:  Diagnosis Date  . Anxiety   . Breast cancer ([place]) 2011   Stave IV breast cancer; radiation and tamoxifen  . GERD (gastroesophageal reflux disease)   . Hypertension   . Macular degeneration     SURGICAL HISTORY: Past Surgical History:  Procedure Laterality Date  . BACK SURGERY    . HERNIA REPAIR    . right arm surgery      SOCIAL HISTORY: Social History   Social History  . Marital status: Married    Spouse name: N/A  . Number of children: 3  . Years of education: N/A   Occupational History  . Not on file.   Social History Main Topics  . Smoking status: Never Smoker  . Smokeless tobacco: Former Systems developer  . Alcohol use 14.4 oz/week    24 Cans of beer per week  . Drug use: No  . Sexual activity: Not on file   Other Topics Concern  . Not on file   Social History Narrative  . No narrative on file  Married for 50 years.  He has 3 children and 5 grandchildren.  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 He has never smoked. He drinks beer.   FAMILY HISTORY: Family History  Problem Relation Age of Onset  . Stroke Mother   . Cancer Maternal Aunt     cancer NOS; died in her 16s  . Lung cancer Maternal Uncle     smoker  Both parents deceased at 55 and 42. Mom died of stroke and dad of old age.   ALLERGIES:  has No Known Allergies.  MEDICATIONS:  Current Outpatient Prescriptions  Medication Sig Dispense Refill  . acyclovir (ZOVIRAX) 400 MG tablet Take 1 tablet (400 mg total) by mouth 2 (two) times daily. 60 tablet 3  . albuterol (PROVENTIL HFA;VENTOLIN HFA) 108 (90  Base) MCG/ACT inhaler Inhale 2 puffs into the lungs every 6 (six) hours as needed for wheezing or shortness of breath. 1 Inhaler 0  . ALPRAZolam (XANAX) 0.5 MG tablet Take 1 tablet (0.5 mg total) by mouth at bedtime as needed for anxiety. 30 tablet 1  . aspirin EC 81 MG tablet Take 162 mg by mouth daily.     . bortezomib IV (VELCADE) 3.5 MG injection Inject 1.3 mg/m2 into the vein once.    . Calcium Carb-Cholecalciferol (CALCIUM 500 + D3) 500-600 MG-UNIT TABS Take 2 tablets by mouth daily. 60 tablet 6  . calcium carbonate (TUMS - DOSED IN MG ELEMENTAL CALCIUM) 500 MG chewable tablet Chew 3 tablets by mouth  daily.     . dexamethasone (DECADRON) 4 MG tablet Take 10 tablets (40 mg) on days 1, 8, and 15 of chemo. Repeat every 21 days. 30 tablet 3  . lenalidomide (REVLIMID) 25 MG capsule Take one capsule daily on days 1-14 every 21 days. 14 capsule 0  . lidocaine-prilocaine (EMLA) cream Apply to affected area once 30 g 3  . LORazepam (ATIVAN) 0.5 MG tablet Take 1 tablet (0.5 mg total) by mouth every 6 (six) hours as needed (Nausea or vomiting). 30 tablet 0  . metoprolol succinate (TOPROL-XL) 100 MG 24 hr tablet Take 100 mg by mouth daily.     . Multiple Vitamins-Minerals (OCUVITE PO) Take by mouth daily.     . ondansetron (ZOFRAN) 8 MG tablet Take 1 tablet (8 mg total) by mouth 2 (two) times daily as needed (Nausea or vomiting). 30 tablet 1  . pantoprazole (PROTONIX) 40 MG tablet Take 40 mg by mouth daily.    . polyethylene glycol (MIRALAX / GLYCOLAX) packet Take 17 g by mouth daily as needed.    . prochlorperazine (COMPAZINE) 10 MG tablet Take 1 tablet (10 mg total) by mouth every 6 (six) hours as needed (Nausea or vomiting). 30 tablet 1  . tamoxifen (NOLVADEX) 20 MG tablet Take 20 mg by mouth daily.    . vitamin B-12 (CYANOCOBALAMIN) 1000 MCG tablet Take 1,000 mcg by mouth daily.      No current facility-administered medications for this visit.     Review of Systems  Constitutional: Negative.   Negative for fever and malaise/fatigue (Fatigue is subsiding and patient reports he feels nearly back to normal.).       Reports a good appetite.  HENT: Negative.   Eyes: Negative.   Cardiovascular: Negative.   Gastrointestinal: Negative.  Negative for diarrhea.  Genitourinary: Negative.  Negative for hematuria.  Musculoskeletal: Negative.   Skin: Negative.  Negative for rash (Previous rash has mostly subsided.).  Neurological: Positive for tingling (Very mild in his toes.). Negative for weakness.  Endo/Heme/Allergies: Negative.   Psychiatric/Behavioral: Negative.   All other systems reviewed and are negative. 14 point ROS was done and is otherwise as detailed above or in HPI   PHYSICAL EXAMINATION: ECOG PERFORMANCE STATUS: 0 - Asymptomatic  Vitals:   04/12/17 1353  BP: 127/73  Pulse: 71  Resp: 18  Temp: 98.9 F (37.2 C)    Physical Exam  Constitutional: He is oriented to person, place, and time and well-developed, well-nourished, and in no distress. No distress.  HENT:  Head: Normocephalic and atraumatic.  Nose: Nose normal.  Mouth/Throat: Oropharynx is clear and moist.  Eyes: Conjunctivae and EOM are normal. Pupils are equal, round, and reactive to light.  Neck: Normal range of motion. Neck supple.  Cardiovascular: Normal rate, regular rhythm and normal heart sounds.  Exam reveals no gallop and no friction rub.   No murmur heard. Pulmonary/Chest: Effort normal and breath sounds normal. No respiratory distress. He has no wheezes. He has no rales. He exhibits no tenderness.  Abdominal: Soft. Bowel sounds are normal. He exhibits no distension and no mass. There is no tenderness. There is no rebound and no guarding.  Musculoskeletal: Normal range of motion.  Neurological: He is alert and oriented to person, place, and time. He has normal reflexes. No cranial nerve deficit. Gait normal. Coordination normal.  Skin: Skin is warm and dry. No rash noted. No erythema. No pallor.    Psychiatric: Mood, memory, affect and judgment normal.  Nursing note and  vitals reviewed.   LABORATORY DATA:  I have reviewed the data as listed Lab Results  Component Value Date   WBC 2.3 (L) 04/12/2017   HGB 9.9 (L) 04/12/2017   HCT 29.1 (L) 04/12/2017   MCV 97.0 04/12/2017   PLT 110 (L) 04/12/2017   CMP     Component Value Date/Time   NA 135 04/12/2017 1303   K 4.1 04/12/2017 1303   CL 106 04/12/2017 1303   CO2 25 04/12/2017 1303   GLUCOSE 151 (H) 04/12/2017 1303   BUN 15 04/12/2017 1303   CREATININE 1.27 (H) 04/12/2017 1303   CALCIUM 9.0 04/12/2017 1303   PROT 7.0 04/12/2017 1303   ALBUMIN 3.5 04/12/2017 1303   AST 23 04/12/2017 1303   ALT 22 04/12/2017 1303   ALKPHOS 25 (L) 04/12/2017 1303   BILITOT 0.3 04/12/2017 1303   GFRNONAA 56 (L) 04/12/2017 1303   GFRAA >60 04/12/2017 1303    RADIOGRAPHIC STUDIES: I have personally reviewed the radiological images as listed and agreed with the findings in the report. No results found.  Study Results  CT-GUIDED BONE MARROW BIOPSY AND ASPIRATION 01/12/2017  IMPRESSION: Technically successful CT guided biopsy of poorly well-defined area of abnormal hypermetabolic activity within the posterosuperior aspect of the left ilium.   CT GUIDED DEEP ILIAC BONE ASPIRATION AND CORE BIOPSY 02/02/2017  IMPRESSION: 1. Technically successful CT guided left iliac bone core and aspiration biopsy.  PATHOLOGY:     Mammogram 04/06/17: IMPRESSION: 1. Significant decrease in size of the biopsy proven malignancy in the 12 o'clock retroareolar left breast, consistent with positive response to antiestrogen therapy with tamoxifen since 2011. On ultrasound, the mass currently measures 0.9 x 0.7 x 1.1 cm. The wing shaped biopsy clip placed at the time of the ultrasound biopsy in 2011 remains within the mass. Please note that the patient's left breast surgery was prior to the diagnosis of left breast cancer, and the biopsied mass has  not been surgically removed. 2. No evidence of malignancy in the right breast. Mild right gynecomastia, which has decreased in prominence since the prior mammogram of 2011.  ASSESSMENT & PLAN:  Stage IV infiltrating ductal carcinoma L breast ER+ HER 2 - on tamoxifen. ISS Stage II IgG kappa multiple myeloma (66% plasma cells in bone marrow) with bone metastasis  PLAN: - I have previously reviewed bone marrow biopsy results in detail with the patient and his family in detail. - We previously discussed starting treatment with revlimid, velcade, dexamethasone (RVD) q21 days. Administration, dosage, and side effects reviewed in detail with the patient and his family. I will have our nurse navigator go over treatment plan with him in detail again. - Proceed with RVD cycle 3 today. - Continue to take acyclovir as directed to prevent reactivation of shingles. Continue to take 2 aspirin daily to prevent thromboembolic events while on revlimid. - Patient states he's been hypotensive so he's cut his effexor dose in half to 50 mg. However he did not tell his PCP. I encouraged the patient to continue to follow with his PCP regularly. - Continue Xgeva every 4 weeks for bone metastases.  - Mammogram in 12 months. Continue tamoxifen. - RTC on 05/03/17 for follow up with cycle 4 RVD and repeat labs.  Total of 25  min spent face to face with patient and family in discussion of his disease and plan of care.   This document serves as a record of services personally performed by Twana First, MD. It was created  on her behalf by Maryla Morrow, a trained medical scribe. The creation of this record is based on the scribe's personal observations and the provider's statements to them. This document has been checked and approved by the attending provider.  I have reviewed the above documentation for accuracy and completeness, and I agree with the above.  This note was electronically signed by:   Twana First,  MD 04/12/2017 2:10 PM

## 2017-04-12 NOTE — Patient Instructions (Signed)
[place] at [facility] Discharge Instructions  RECOMMENDATIONS MADE BY THE CONSULTANT AND ANY TEST RESULTS WILL BE SENT TO YOUR REFERRING PHYSICIAN.  You were seen today by Dr. Twana First Follow up with next chemotherapy cycle with lab work See Amy up front for appointments  Thank you for choosing [place] at [facility] to provide your oncology and hematology care.  To afford each patient quality time with our provider, please arrive at least 15 minutes before your scheduled appointment time.    If you have a lab appointment with the [place] [facility] please come in thru the  Main Entrance and check in at the main information desk  You need to re-schedule your appointment should you arrive 10 or more minutes late.  We strive to give you quality time with our providers, and arriving late affects you and other patients whose appointments are after yours.  Also, if you no show three or more times for appointments you may be dismissed from the clinic at the providers discretion.     Again, thank you for choosing [facility].  Our hope is that these requests will decrease the amount of time that you wait before being seen by our physicians.       _____________________________________________________________  Should you have questions after your visit to [facility], please contact our office at [phone] between the hours of 8:30 a.m. and 4:30 p.m.  Voicemails left after 4:30 p.m. will not be returned until the following business day.  For prescription refill requests, have your pharmacy contact our office.       Resources For Cancer Patients and their Caregivers ? American Cancer Society: Can assist with transportation, wigs, general needs, runs Look Good Feel Better.        ([phone] 655-4344 ? Cancer Care: Provides financial assistance, online support groups, medication/co-pay assistance.  1-800-813-HOPE  (928) 518-6333) ? [place] Assists [place] cancer patients and their families through emotional , educational and financial support.  671-534-9551 ? [place] DSS Where to apply for food stamps, Medicaid and utility assistance. 973-524-4588 ? RCATS: Transportation to medical appointments. 270-178-9497 ? Social Security Administration: May apply for disability if have a Stage IV cancer. 303-286-3461 (337) 748-7901 ? [employer], Disability and Transit Services: Assists with nutrition, care and transit needs. [place] Support Programs: @10RELATIVEDAYS @ > Cancer Support Group  2nd Tuesday of the month 1pm-2pm, Journey Room  > Creative Journey  3rd Tuesday of the month 1130am-1pm, Journey Room  > Look Good Feel Better  1st Wednesday of the month 10am-12 noon, Journey Room (Call [place] to register [phone])

## 2017-04-13 ENCOUNTER — Encounter (HOSPITAL_BASED_OUTPATIENT_CLINIC_OR_DEPARTMENT_OTHER): Payer: Medicare Other

## 2017-04-13 VITALS — BP 114/62 | HR 73 | Temp 98.5°F | Resp 18

## 2017-04-13 DIAGNOSIS — C9000 Multiple myeloma not having achieved remission: Secondary | ICD-10-CM | POA: Diagnosis present

## 2017-04-13 DIAGNOSIS — C7951 Secondary malignant neoplasm of bone: Secondary | ICD-10-CM | POA: Diagnosis not present

## 2017-04-13 DIAGNOSIS — C50122 Malignant neoplasm of central portion of left male breast: Secondary | ICD-10-CM

## 2017-04-13 LAB — PROTEIN ELECTROPHORESIS, SERUM
A/G RATIO SPE: 1.3 (ref 0.7–1.7)
ALBUMIN ELP: 3.7 g/dL (ref 2.9–4.4)
Alpha-1-Globulin: 0.2 g/dL (ref 0.0–0.4)
Alpha-2-Globulin: 0.6 g/dL (ref 0.4–1.0)
BETA GLOBULIN: 0.8 g/dL (ref 0.7–1.3)
GLOBULIN, TOTAL: 2.9 g/dL (ref 2.2–3.9)
Gamma Globulin: 1.3 g/dL (ref 0.4–1.8)
M-Spike, %: 1 g/dL — ABNORMAL HIGH
Total Protein ELP: 6.6 g/dL (ref 6.0–8.5)

## 2017-04-13 LAB — IGG, IGA, IGM
IgA: 23 mg/dL — ABNORMAL LOW (ref 61–437)
IgG (Immunoglobin G), Serum: 1263 mg/dL (ref 700–1600)
IgM, Serum: 17 mg/dL — ABNORMAL LOW (ref 20–172)

## 2017-04-13 MED ORDER — DENOSUMAB 120 MG/1.7ML ~~LOC~~ SOLN
120.0000 mg | Freq: Once | SUBCUTANEOUS | Status: AC
  Administered 2017-04-13: 120 mg via SUBCUTANEOUS
  Filled 2017-04-13: qty 1.7

## 2017-04-13 NOTE — Patient Instructions (Signed)
[facility] at [facility] Discharge Instructions  RECOMMENDATIONS MADE BY THE CONSULTANT AND ANY TEST RESULTS WILL BE SENT TO YOUR REFERRING PHYSICIAN.  Received Xgeva injection today. Follow-up as scheduled. Call clinic for any questions or concerns  Thank you for choosing  [facility] at [facility] to provide your oncology and hematology care.  To afford each patient quality time with our provider, please arrive at least 15 minutes before your scheduled appointment time.    If you have a lab appointment with the Cancer Center please come in thru the  Main Entrance and check in at the main information desk  You need to re-schedule your appointment should you arrive 10 or more minutes late.  We strive to give you quality time with our providers, and arriving late affects you and other patients whose appointments are after yours.  Also, if you no show three or more times for appointments you may be dismissed from the clinic at the providers discretion.     Again, thank you for choosing [facility].  Our hope is that these requests will decrease the amount of time that you wait before being seen by our physicians.       _____________________________________________________________  Should you have questions after your visit to [facility], please contact our office at [phone] between the hours of 8:30 a.m. and 4:30 p.m.  Voicemails left after 4:30 p.m. will not be returned until the following business day.  For prescription refill requests, have your pharmacy contact our office.       Resources For Cancer Patients and their Caregivers ? American Cancer Society: Can assist with transportation, wigs, general needs, runs Look Good Feel Better.        1-888-227-6333 ? Cancer Care: Provides financial assistance, online support groups, medication/co-pay assistance.  1-800-813-HOPE (4673) ? [facility] Assists [place] cancer patients and their families through emotional , educational and financial support.  336-427-4357 ? [place] DSS Where to apply for food stamps, Medicaid and utility assistance. 336-342-1394 ? RCATS: Transportation to medical appointments. 336-347-2287 ? Social Security Administration: May apply for disability if have a Stage IV cancer. 336-342-7796 1-800-772-1213 ? [place] Aging, Disability and Transit Services: Assists with nutrition, care and transit needs. 336-349-2343  Cancer Center Support Programs: @10RELATIVEDAYS@ > Cancer Support Group  2nd Tuesday of the month 1pm-2pm, Journey Room  > Creative Journey  3rd Tuesday of the month 1130am-1pm, Journey Room  > Look Good Feel Better  1st Wednesday of the month 10am-12 noon, Journey Room (Call American Cancer Society to register 1-800-395-5775)   

## 2017-04-13 NOTE — Progress Notes (Signed)
[name] tolerated Xgeva injection well without complaints or incident. Calcium 9 from yesterday's labs.VSS Pt denied any tooth.jaw or leg pain prior to administering Xgeva. Pt discharged self ambulatory in satisfactory condition accompanied by his wife

## 2017-04-16 LAB — IMMUNOFIXATION ELECTROPHORESIS
IGG (IMMUNOGLOBIN G), SERUM: 1308 mg/dL (ref 700–1600)
IGM, SERUM: 18 mg/dL — AB (ref 20–172)
IgA: 24 mg/dL — ABNORMAL LOW (ref 61–437)
Total Protein ELP: 6.5 g/dL (ref 6.0–8.5)

## 2017-04-19 ENCOUNTER — Encounter (HOSPITAL_BASED_OUTPATIENT_CLINIC_OR_DEPARTMENT_OTHER): Payer: Medicare Other | Admitting: Oncology

## 2017-04-19 ENCOUNTER — Encounter (HOSPITAL_COMMUNITY): Payer: Medicare Other

## 2017-04-19 ENCOUNTER — Encounter (HOSPITAL_BASED_OUTPATIENT_CLINIC_OR_DEPARTMENT_OTHER): Payer: Medicare Other

## 2017-04-19 VITALS — BP 112/61 | HR 67 | Temp 98.2°F | Resp 18 | Wt 210.4 lb

## 2017-04-19 DIAGNOSIS — C9000 Multiple myeloma not having achieved remission: Secondary | ICD-10-CM | POA: Diagnosis not present

## 2017-04-19 DIAGNOSIS — L270 Generalized skin eruption due to drugs and medicaments taken internally: Secondary | ICD-10-CM | POA: Diagnosis not present

## 2017-04-19 DIAGNOSIS — C50922 Malignant neoplasm of unspecified site of left male breast: Secondary | ICD-10-CM | POA: Diagnosis not present

## 2017-04-19 DIAGNOSIS — C7951 Secondary malignant neoplasm of bone: Secondary | ICD-10-CM | POA: Diagnosis not present

## 2017-04-19 DIAGNOSIS — Z170 Estrogen receptor positive status [ER+]: Secondary | ICD-10-CM | POA: Diagnosis not present

## 2017-04-19 DIAGNOSIS — Z5112 Encounter for antineoplastic immunotherapy: Secondary | ICD-10-CM

## 2017-04-19 LAB — COMPREHENSIVE METABOLIC PANEL
ALT: 21 U/L (ref 17–63)
ANION GAP: 5 (ref 5–15)
AST: 21 U/L (ref 15–41)
Albumin: 3.4 g/dL — ABNORMAL LOW (ref 3.5–5.0)
Alkaline Phosphatase: 23 U/L — ABNORMAL LOW (ref 38–126)
BUN: 15 mg/dL (ref 6–20)
CHLORIDE: 105 mmol/L (ref 101–111)
CO2: 24 mmol/L (ref 22–32)
Calcium: 8.5 mg/dL — ABNORMAL LOW (ref 8.9–10.3)
Creatinine, Ser: 1.24 mg/dL (ref 0.61–1.24)
GFR, EST NON AFRICAN AMERICAN: 58 mL/min — AB (ref >60)
Glucose, Bld: 146 mg/dL — ABNORMAL HIGH (ref 65–99)
Potassium: 4.2 mmol/L (ref 3.5–5.1)
SODIUM: 134 mmol/L — AB (ref 135–145)
Total Bilirubin: 0.3 mg/dL (ref 0.3–1.2)
Total Protein: 6.5 g/dL (ref 6.5–8.1)

## 2017-04-19 LAB — CBC WITH DIFFERENTIAL/PLATELET
Basophils Absolute: 0 10*3/uL (ref 0.0–0.1)
Basophils Relative: 0 %
EOS ABS: 0 10*3/uL (ref 0.0–0.7)
EOS PCT: 0 %
HCT: 27.1 % — ABNORMAL LOW (ref 39.0–52.0)
Hemoglobin: 9.1 g/dL — ABNORMAL LOW (ref 13.0–17.0)
LYMPHS ABS: 0.4 10*3/uL — AB (ref 0.7–4.0)
Lymphocytes Relative: 15 %
MCH: 33 pg (ref 26.0–34.0)
MCHC: 33.6 g/dL (ref 30.0–36.0)
MCV: 98.2 fL (ref 78.0–100.0)
MONO ABS: 0.1 10*3/uL (ref 0.1–1.0)
MONOS PCT: 3 %
Neutro Abs: 1.8 10*3/uL (ref 1.7–7.7)
Neutrophils Relative %: 82 %
PLATELETS: 133 10*3/uL — AB (ref 150–400)
RBC: 2.76 MIL/uL — ABNORMAL LOW (ref 4.22–5.81)
RDW: 16.2 % — AB (ref 11.5–15.5)
WBC: 2.3 10*3/uL — AB (ref 4.0–10.5)

## 2017-04-19 MED ORDER — BORTEZOMIB CHEMO SQ INJECTION 3.5 MG (2.5MG/ML)
1.3000 mg/m2 | Freq: Once | INTRAMUSCULAR | Status: AC
  Administered 2017-04-19: 2.75 mg via SUBCUTANEOUS
  Filled 2017-04-19: qty 2.75

## 2017-04-19 MED ORDER — LENALIDOMIDE 25 MG PO CAPS: ORAL_CAPSULE | ORAL | 0 refills | Status: DC

## 2017-04-19 MED ORDER — PROCHLORPERAZINE MALEATE 10 MG PO TABS
ORAL_TABLET | ORAL | Status: AC
  Filled 2017-04-19: qty 1

## 2017-04-19 MED ORDER — PROCHLORPERAZINE MALEATE 10 MG PO TABS
10.0000 mg | ORAL_TABLET | Freq: Once | ORAL | Status: AC
  Administered 2017-04-19: 10 mg via ORAL

## 2017-04-19 NOTE — Progress Notes (Signed)
Patient is seen as a work-in today for a post-velcade injection rash.  He denies any pruritis of the rash.  He reports that it typically resolves prior to his next injection.  He is using OTC hydrocortisone for this issue with improvement.     I have recommended ongoing hydrocortisone.  Options moving forward include an antihistamine the day prior, the day of and the day following Velcade injection.  Another option is for pharmacy to dilute Velcade injection.  He will get his scheduled injection as planned today.    Robynn Pane, PA-C 04/19/2017 4:57 PM

## 2017-04-19 NOTE — Progress Notes (Signed)
Large red area induration noted left lower abdomen at last velcade injection site. Patient reports this is the largest area so far. Reports area begins to get red and itch 2 days post injection. T.Kefalas,PA examined area and took pictures. Will consult with MD. Labs reviewed with Dr.Zhou. Give injection as ordered today. Patient denies any other complaints and reports compliance with medications at home.  Johnny Lucas presents today for injection per MD orders. Velcade administered SQ in right Abdomen. Administration without incident. Patient tolerated well.  Stable and ambulatory on discharge home with wife.

## 2017-04-19 NOTE — Addendum Note (Signed)
Addended by: Baird Cancer on: 04/19/2017 05:35 PM   Modules accepted: Orders

## 2017-04-19 NOTE — Patient Instructions (Signed)
[facility] Discharge Instructions for Patients Receiving Chemotherapy   Beginning January 23rd 2017 lab work for the [facility] will be done in the  Main lab at [facility] on 1st floor. If you have a lab appointment with the [place] please come in thru the  Main Entrance and check in at the main information desk   Today you received the following chemotherapy agents velcade injection.  If you develop nausea and vomiting, or diarrhea that is not controlled by your medication, call the clinic.  The clinic phone number is [phone]. Office hours are Monday-Friday 8:30am-5:00pm.  BELOW ARE SYMPTOMS THAT SHOULD BE REPORTED IMMEDIATELY:  *FEVER GREATER THAN 101.0 F  *CHILLS WITH OR WITHOUT FEVER  NAUSEA AND VOMITING THAT IS NOT CONTROLLED WITH YOUR NAUSEA MEDICATION  *UNUSUAL SHORTNESS OF BREATH  *UNUSUAL BRUISING OR BLEEDING  TENDERNESS IN MOUTH AND THROAT WITH OR WITHOUT PRESENCE OF ULCERS  *URINARY PROBLEMS  *BOWEL PROBLEMS  UNUSUAL RASH Items with * indicate a potential emergency and should be followed up as soon as possible. If you have an emergency after office hours please contact your primary care physician or go to the nearest emergency department.  Please call the clinic during office hours if you have any questions or concerns.   You may also contact the Patient Navigator at [phone] should you have any questions or need assistance in obtaining follow up care.      Resources For Cancer Patients and their Caregivers ? American Cancer Society: Can assist with transportation, wigs, general needs, runs Look Good Feel Better.        765-444-6608 ? Cancer Care: Provides financial assistance, online support groups, medication/co-pay assistance.  1-800-813-HOPE 606-776-5706) ? [place] Assists [place] cancer patients and their families through emotional , educational and financial support.   361-062-0856 ? [place] DSS Where to apply for food stamps, Medicaid and utility assistance. 367-778-3325 ? RCATS: Transportation to medical appointments. 825-135-1786 ? Social Security Administration: May apply for disability if have a Stage IV cancer. 3467543624 (239)074-3170 ? [employer], Disability and Transit Services: Assists with nutrition, care and transit needs. 579-769-1276

## 2017-04-21 ENCOUNTER — Telehealth (HOSPITAL_COMMUNITY): Payer: Self-pay | Admitting: *Deleted

## 2017-04-21 NOTE — Telephone Encounter (Signed)
Spoke with patient per recommendations for antihistamine prior to velcade injections. Verbalized understanding. States injection from Monday hasn't flared yet but is usually does by [place] or [employer]. Patient will call if any additional questions/concerns.

## 2017-04-21 NOTE — Telephone Encounter (Signed)
-----   Message from Baird Cancer, PA-C sent at 04/19/2017  4:57 PM EDT ----- Please let patient know that I did discuss his case with [facility].  They recommended an OTC anti-histamine (Claritin, Zyrtec, Allegra, etc) the day prior, the day of, and the day after injection to see if that helps.  Another option that provided is to have pharmacy dilute the Velcade prior to administration.  Please let the patient know that he can try the antihistamine option for his next injections.  TK

## 2017-04-23 DIAGNOSIS — Z6830 Body mass index (BMI) 30.0-30.9, adult: Secondary | ICD-10-CM | POA: Diagnosis not present

## 2017-04-23 DIAGNOSIS — C9000 Multiple myeloma not having achieved remission: Secondary | ICD-10-CM | POA: Diagnosis not present

## 2017-04-23 DIAGNOSIS — C50929 Malignant neoplasm of unspecified site of unspecified male breast: Secondary | ICD-10-CM | POA: Diagnosis not present

## 2017-04-23 DIAGNOSIS — I10 Essential (primary) hypertension: Secondary | ICD-10-CM | POA: Diagnosis not present

## 2017-04-26 ENCOUNTER — Encounter (HOSPITAL_BASED_OUTPATIENT_CLINIC_OR_DEPARTMENT_OTHER): Payer: Medicare Other

## 2017-04-26 ENCOUNTER — Encounter (HOSPITAL_COMMUNITY): Payer: Self-pay

## 2017-04-26 ENCOUNTER — Encounter (HOSPITAL_COMMUNITY): Payer: Medicare Other

## 2017-04-26 VITALS — BP 119/65 | HR 69 | Resp 18 | Wt 207.0 lb

## 2017-04-26 DIAGNOSIS — C50922 Malignant neoplasm of unspecified site of left male breast: Secondary | ICD-10-CM | POA: Diagnosis not present

## 2017-04-26 DIAGNOSIS — C9000 Multiple myeloma not having achieved remission: Secondary | ICD-10-CM

## 2017-04-26 DIAGNOSIS — C7951 Secondary malignant neoplasm of bone: Secondary | ICD-10-CM | POA: Diagnosis not present

## 2017-04-26 DIAGNOSIS — Z5112 Encounter for antineoplastic immunotherapy: Secondary | ICD-10-CM | POA: Diagnosis present

## 2017-04-26 DIAGNOSIS — Z170 Estrogen receptor positive status [ER+]: Secondary | ICD-10-CM | POA: Diagnosis not present

## 2017-04-26 LAB — CBC WITH DIFFERENTIAL/PLATELET
Basophils Absolute: 0 10*3/uL (ref 0.0–0.1)
Basophils Relative: 1 %
Eosinophils Absolute: 0 10*3/uL (ref 0.0–0.7)
Eosinophils Relative: 2 %
HEMATOCRIT: 28.6 % — AB (ref 39.0–52.0)
HEMOGLOBIN: 9.7 g/dL — AB (ref 13.0–17.0)
LYMPHS PCT: 16 %
Lymphs Abs: 0.3 10*3/uL — ABNORMAL LOW (ref 0.7–4.0)
MCH: 33.4 pg (ref 26.0–34.0)
MCHC: 33.9 g/dL (ref 30.0–36.0)
MCV: 98.6 fL (ref 78.0–100.0)
MONOS PCT: 4 %
Monocytes Absolute: 0.1 10*3/uL (ref 0.1–1.0)
NEUTROS ABS: 1.6 10*3/uL — AB (ref 1.7–7.7)
NEUTROS PCT: 77 %
Platelets: 127 10*3/uL — ABNORMAL LOW (ref 150–400)
RBC: 2.9 MIL/uL — AB (ref 4.22–5.81)
RDW: 16.2 % — ABNORMAL HIGH (ref 11.5–15.5)
WBC: 2.1 10*3/uL — ABNORMAL LOW (ref 4.0–10.5)

## 2017-04-26 LAB — COMPREHENSIVE METABOLIC PANEL
ALK PHOS: 26 U/L — AB (ref 38–126)
ALT: 23 U/L (ref 17–63)
ANION GAP: 4 — AB (ref 5–15)
AST: 26 U/L (ref 15–41)
Albumin: 3.5 g/dL (ref 3.5–5.0)
BILIRUBIN TOTAL: 0.5 mg/dL (ref 0.3–1.2)
BUN: 14 mg/dL (ref 6–20)
CALCIUM: 8.7 mg/dL — AB (ref 8.9–10.3)
CO2: 25 mmol/L (ref 22–32)
CREATININE: 1.42 mg/dL — AB (ref 0.61–1.24)
Chloride: 107 mmol/L (ref 101–111)
GFR calc non Af Amer: 49 mL/min — ABNORMAL LOW (ref >60)
GFR, EST AFRICAN AMERICAN: 57 mL/min — AB (ref >60)
GLUCOSE: 133 mg/dL — AB (ref 65–99)
Potassium: 4.1 mmol/L (ref 3.5–5.1)
Sodium: 136 mmol/L (ref 135–145)
TOTAL PROTEIN: 6.6 g/dL (ref 6.5–8.1)

## 2017-04-26 MED ORDER — BORTEZOMIB CHEMO SQ INJECTION 3.5 MG (2.5MG/ML)
1.3000 mg/m2 | Freq: Once | INTRAMUSCULAR | Status: AC
  Administered 2017-04-26: 2.75 mg via SUBCUTANEOUS
  Filled 2017-04-26: qty 2.75

## 2017-04-26 MED ORDER — PROCHLORPERAZINE MALEATE 10 MG PO TABS
10.0000 mg | ORAL_TABLET | Freq: Once | ORAL | Status: AC
  Administered 2017-04-26: 10 mg via ORAL
  Filled 2017-04-26: qty 1

## 2017-04-26 MED ORDER — LORATADINE 10 MG PO TABS: 10.0000 mg | ORAL_TABLET | Freq: Every day | ORAL | 3 refills | Status: DC

## 2017-04-26 NOTE — Progress Notes (Signed)
Johnny Lucas presents today for injection per MD orders. Velcade 2.75mg  administered SQ in left Abdomen. Administration without incident. Patient tolerated well. Vitals stable and discharged home from clinic ambulatory. Follow up as scheduled.

## 2017-04-26 NOTE — Patient Instructions (Signed)
[facility] Discharge Instructions for Patients Receiving Chemotherapy   Beginning January 23rd 2017 lab work for the Cancer Center will be done in the  Main lab at [place] on 1st floor. If you have a lab appointment with the Cancer Center please come in thru the  Main Entrance and check in at the main information desk   Today you received the following chemotherapy agents   To help prevent nausea and vomiting after your treatment, we encourage you to take your nausea medication     If you develop nausea and vomiting, or diarrhea that is not controlled by your medication, call the clinic.  The clinic phone number is [phone]. Office hours are Monday-Friday 8:30am-5:00pm.  BELOW ARE SYMPTOMS THAT SHOULD BE REPORTED IMMEDIATELY:  *FEVER GREATER THAN 101.0 F  *CHILLS WITH OR WITHOUT FEVER  NAUSEA AND VOMITING THAT IS NOT CONTROLLED WITH YOUR NAUSEA MEDICATION  *UNUSUAL SHORTNESS OF BREATH  *UNUSUAL BRUISING OR BLEEDING  TENDERNESS IN MOUTH AND THROAT WITH OR WITHOUT PRESENCE OF ULCERS  *URINARY PROBLEMS  *BOWEL PROBLEMS  UNUSUAL RASH Items with * indicate a potential emergency and should be followed up as soon as possible. If you have an emergency after office hours please contact your primary care physician or go to the nearest emergency department.  Please call the clinic during office hours if you have any questions or concerns.   You may also contact the Patient Navigator at [phone] should you have any questions or need assistance in obtaining follow up care.      Resources For Cancer Patients and their Caregivers ? American Cancer Society: Can assist with transportation, wigs, general needs, runs Look Good Feel Better.        1-888-227-6333 ? Cancer Care: Provides financial assistance, online support groups, medication/co-pay assistance.  1-800-813-HOPE (4673) ? [facility] Assists [place] cancer  patients and their families through emotional , educational and financial support.  336-427-4357 ? [place] DSS Where to apply for food stamps, Medicaid and utility assistance. 336-342-1394 ? [employer]: Transportation to medical appointments. 336-347-2287 ? Social Security Administration: May apply for disability if have a Stage IV cancer. 336-342-7796 1-800-772-1213 ? [place] Aging, Disability and Transit Services: Assists with nutrition, care and transit needs. 336-349-2343         

## 2017-05-03 ENCOUNTER — Encounter (HOSPITAL_COMMUNITY): Payer: Self-pay | Admitting: Oncology

## 2017-05-03 ENCOUNTER — Encounter (HOSPITAL_COMMUNITY): Payer: Medicare Other | Attending: Oncology | Admitting: Oncology

## 2017-05-03 ENCOUNTER — Encounter (HOSPITAL_BASED_OUTPATIENT_CLINIC_OR_DEPARTMENT_OTHER): Payer: Medicare Other

## 2017-05-03 ENCOUNTER — Encounter (HOSPITAL_COMMUNITY): Payer: Medicare Other

## 2017-05-03 VITALS — BP 119/68 | HR 67 | Temp 98.8°F | Resp 18 | Ht 68.0 in | Wt 206.0 lb

## 2017-05-03 DIAGNOSIS — C9000 Multiple myeloma not having achieved remission: Secondary | ICD-10-CM

## 2017-05-03 DIAGNOSIS — C7951 Secondary malignant neoplasm of bone: Secondary | ICD-10-CM | POA: Insufficient documentation

## 2017-05-03 DIAGNOSIS — Z170 Estrogen receptor positive status [ER+]: Secondary | ICD-10-CM | POA: Diagnosis not present

## 2017-05-03 DIAGNOSIS — C50122 Malignant neoplasm of central portion of left male breast: Secondary | ICD-10-CM

## 2017-05-03 DIAGNOSIS — C50922 Malignant neoplasm of unspecified site of left male breast: Secondary | ICD-10-CM | POA: Diagnosis not present

## 2017-05-03 DIAGNOSIS — Z5112 Encounter for antineoplastic immunotherapy: Secondary | ICD-10-CM | POA: Diagnosis present

## 2017-05-03 LAB — CBC WITH DIFFERENTIAL/PLATELET
BASOS ABS: 0 10*3/uL (ref 0.0–0.1)
BASOS PCT: 1 %
EOS PCT: 1 %
Eosinophils Absolute: 0 10*3/uL (ref 0.0–0.7)
HCT: 28.2 % — ABNORMAL LOW (ref 39.0–52.0)
Hemoglobin: 9.5 g/dL — ABNORMAL LOW (ref 13.0–17.0)
LYMPHS PCT: 16 %
Lymphs Abs: 0.4 10*3/uL — ABNORMAL LOW (ref 0.7–4.0)
MCH: 32.9 pg (ref 26.0–34.0)
MCHC: 33.7 g/dL (ref 30.0–36.0)
MCV: 97.6 fL (ref 78.0–100.0)
MONO ABS: 0.1 10*3/uL (ref 0.1–1.0)
Monocytes Relative: 4 %
Neutro Abs: 1.8 10*3/uL (ref 1.7–7.7)
Neutrophils Relative %: 78 %
Platelets: 143 10*3/uL — ABNORMAL LOW (ref 150–400)
RBC: 2.89 MIL/uL — ABNORMAL LOW (ref 4.22–5.81)
RDW: 16.2 % — ABNORMAL HIGH (ref 11.5–15.5)
WBC: 2.3 10*3/uL — ABNORMAL LOW (ref 4.0–10.5)

## 2017-05-03 LAB — COMPREHENSIVE METABOLIC PANEL
ALK PHOS: 25 U/L — AB (ref 38–126)
ALT: 27 U/L (ref 17–63)
ANION GAP: 4 — AB (ref 5–15)
AST: 30 U/L (ref 15–41)
Albumin: 3.5 g/dL (ref 3.5–5.0)
BILIRUBIN TOTAL: 0.3 mg/dL (ref 0.3–1.2)
BUN: 18 mg/dL (ref 6–20)
CALCIUM: 9.2 mg/dL (ref 8.9–10.3)
CO2: 25 mmol/L (ref 22–32)
CREATININE: 1.4 mg/dL — AB (ref 0.61–1.24)
Chloride: 107 mmol/L (ref 101–111)
GFR calc non Af Amer: 50 mL/min — ABNORMAL LOW (ref >60)
GFR, EST AFRICAN AMERICAN: 58 mL/min — AB (ref >60)
Glucose, Bld: 124 mg/dL — ABNORMAL HIGH (ref 65–99)
Potassium: 4.2 mmol/L (ref 3.5–5.1)
Sodium: 136 mmol/L (ref 135–145)
Total Protein: 6.6 g/dL (ref 6.5–8.1)

## 2017-05-03 LAB — C-REACTIVE PROTEIN

## 2017-05-03 LAB — LACTATE DEHYDROGENASE: LDH: 154 U/L (ref 98–192)

## 2017-05-03 LAB — SEDIMENTATION RATE: SED RATE: 20 mm/h — AB (ref 0–16)

## 2017-05-03 MED ORDER — BORTEZOMIB CHEMO SQ INJECTION 3.5 MG (2.5MG/ML)
1.3000 mg/m2 | Freq: Once | INTRAMUSCULAR | Status: AC
  Administered 2017-05-03: 2.75 mg via SUBCUTANEOUS
  Filled 2017-05-03: qty 1.4

## 2017-05-03 MED ORDER — PROCHLORPERAZINE MALEATE 10 MG PO TABS
10.0000 mg | ORAL_TABLET | Freq: Once | ORAL | Status: AC
  Administered 2017-05-03: 10 mg via ORAL
  Filled 2017-05-03: qty 1

## 2017-05-03 NOTE — Progress Notes (Unsigned)
Patient referred to Dr. Norma Fredrickson at [name] for bone marrow transplant consideration.

## 2017-05-03 NOTE — Assessment & Plan Note (Addendum)
Multiple myeloma with bone marrow aspiration and biopsy on 02/02/2017 demonstrating 66% plasma cells (on aspirate and 30% on CD 138) after a left ilium bone biopsy demonstrated a plasma cell neoplasm (performed due to concern for progression of disease of breast cancer).  Now on RVD therapy beginning on 03/01/2017.  Oncology history updated.  Labs today: CBC diff, CMET, LDH, ESR, CRP, SPEP+IFE, light chain assay, B2M.  I personally reviewed and went over laboratory results with the patient.  The results are noted within this dictation.  Labs satisfy treatment parameters today.  He is to continue with ASA daily to reduce the risk of VTE (Revlimid-induced). He is to continue with Acyclovir to reduce the risk of zoster reactivation.  While getting therapy today, nursing will monitor patient closely for any acute toxicities in accordance with chemotherapy protocol.  I personally reviewed and went over laboratory results with the patient.  The results are noted within this dictation.  M-Spike has declined from 2.3 to 1.0.  IgG from 3225 to 1308.  Kappa free light chain and ratio have declined as well.  All of the abovementioned lab work is ordered today as well for ongoing comparison.  I personally reviewed and went over radiographic studies with the patient.  The results are noted within this dictation.  I personally reviewed the images in PACS.  Multiple bony lesions are noted.  Continue with RVD.  He has been experiencing a Velcade-induced rash.  I have discussed with Dr. Norma Fredrickson in the past about this and he recommended Claritin the day before, day of, and day after Velcade to see if this helps.  Patient notified of this recommendation and he has tried this today.  Given his response to therapy, will refer the patient to Dr. Norma Fredrickson for consideration of BMT.  I do not see that this referral has been made yet.  He is starting cycle #4 today.  This is my first time formally meeting Johnny Lucas who is  very nice and pleasant.  I reviewed the role of BMT in his multiple myeloma care and treatment.  Return in 3-4 weeks for ongoing follow-up and treatment.  45 minutes was spent in face-to-face encounter with the patient and his wife discussing role of BMT and referral to [facility] for consultation.  More than 50% of the time spent with the patient was utilized for counseling and coordination of care.

## 2017-05-03 NOTE — Progress Notes (Signed)
Johnny Percy, MD [place] [address]  Multiple myeloma not having achieved remission [facility]) - Plan: Sedimentation rate, C-reactive protein, IgG, IgA, IgM, Immunofixation electrophoresis, Protein electrophoresis, serum  Malignant neoplasm of left breast in male, estrogen receptor positive, unspecified site of breast ([place])  Bone metastases ([place])  CURRENT THERAPY: RVD beginning on 03/01/2017.  Tamoxifen for breast cancer treatment.  Delton See for bone mets  INTERVAL HISTORY: Johnny Lucas 71 y.o. male returns for followup of multiple myeloma with bone marrow aspiration and biopsy on 02/02/2017 demonstrating 66% plasma cells (on aspirate and 30% on CD 138) after a left ilium bone biopsy demonstrated a plasma cell neoplasm (performed due to concern for progression of disease of breast cancer).  Now on RVD therapy beginning on 03/01/2017. AND Stage IV infiltrating ductal carcinoma of left breast, ER+, HER2 negative, on Tamoxifen, diagnosed and managed at [facility] by Dr. Jacquiline Doe in [place], [place].  At time of Dx, bone scan demonstrated multiple boney lesions requiring rod placement in R humerus by Dr. [name] at [facility] ortho with right humerus canal curettage positive for ER+ metastatic breast cancer in 2011.  He has never had lumpectomy/mastectomy to date. AND Bone lesions, right humerus confirmed to be from breast cancer (2011), left ileal bone lesion biopsy proven to be multiple myeloma (2018).  On Xgeva to reduce SRE.    Breast cancer, male ([place])   12/31/2009 Initial Biopsy    Biopsy of L breast       12/31/2009 Pathology Results    Invasive ductal carcinoma, ER/PR+, HER 2 negative      12/31/2009 Imaging    Ultrasound showing a 2.43 x 1.85 x 3 cm hypoechoic spiculated mass in the 12 o clock L breast retroareolar region      01/01/2010 -  Anti-estrogen oral therapy    Tamoxifen 20 mg daily      01/06/2010 Imaging    Bone scan abnormal uptake in the diaphysis of the R humerus, abnormal in  the R third, fifth and sixth ribs, lesion also noted in the sternum.      02/03/2010 Surgery    Rod placement and fixation of R humerus by Dr. [name]      02/05/2010 - 02/18/2010 Radiation Therapy    30Gy in 10 fractions of 3 Gy per fraction to R pathologic fracture      03/11/2010 -  Chemotherapy    Denosumab monthly, now every 3 months. Started at [facility]       06/09/2016 Imaging    Three hypermetabolic osseous lesions in the sternum, left ilium and right ilium, as discussed above, likely represent osseous metastases. At this time, these are not recognizable on the CT images. 2. No extra skeletal metastatic disease identified in the neck, chest, abdomen or pelvis.      10/13/2016 Progression    PET shows various new and enlarging osseous metastatic lesions with no definite extra osseous metastatic disease currently identified.       12/31/2016 Progression    1. Multifocal hypermetabolic osseous metastases throughout the axial and proximal appendicular skeleton, which are increased in size, number and metabolism since 10/13/2016 PET-CT. 2. 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 3. No additional sites of hypermetabolic metastatic disease. 4. Chronic right mastoid sinusitis. 5. Aortic atherosclerosis.  One vessel coronary atherosclerosis.       Multiple myeloma ([place])  02/12/2017 Bone Marrow Biopsy    The marrow was variably cellular with large peritrabecular aggregates of kappa restricted plasma cells (66% by aspirate, 30% by Cd138). Cytogenetics +11.       03/01/2017 -  Chemotherapy    RVD         HPI Elements Multiple myeloma  Location: Bone marrow and bone lesions  Quality: IgG kappa multiple myeloma  Severity: ISS- Stage II  Duration: Dx on Jan 2018  Context: In setting of Stage IV male breast cancer, ER+, HER-  Timing:   Modifying Factors: S/P  left breast biopsy without lumpectomy/mastectomy in 2011.  Stage IV disease biopsy proven with R humerus rod placement and curettage of humeral bone canal demonstrating metastatic breast cancer (ER+).  Associated Signs & Symptoms:    HPI Elements Left breast cancer  Location: Left breast  Quality: ER+, HER2 NEGATIVE  Severity: Stage IV  Duration: Dx in 2011  Context: Multiple bony lesions, Right humerus requiring rod placement  Timing:   Modifying Factors:   Associated Signs & Symptoms:    He is doing well and denies any complaints today.  We spent a significant amount time discussing the role of bone marrow transplant in his multiple myeloma care.  He has an excellent performance status and I think it is worth his while to learn more about this therapy modality in an attempt to deepen his remission.  He is agreeable to this plan moving forward.  He provided a significant amount of elementary education regarding bone marrow transplantation.  His post Velcade abdominal rash is resolved.  Review of Systems  Constitutional: Negative.  Negative for chills, fever and weight loss.  HENT: Negative.   Eyes: Negative.   Respiratory: Negative.  Negative for cough.   Cardiovascular: Negative.  Negative for chest pain.  Gastrointestinal: Negative.  Negative for blood in stool, constipation, diarrhea, melena, nausea and vomiting.  Genitourinary: Negative.   Musculoskeletal: Negative.   Skin: Negative.   Neurological: Negative.  Negative for weakness.  Endo/Heme/Allergies: Negative.   Psychiatric/Behavioral: Negative.     Past Medical History:  Diagnosis Date  . Anxiety   . Bone metastases ([place]) 09/10/2016  . Breast cancer ([place]) 2011   Stave IV breast cancer; radiation and tamoxifen  . Breast cancer, male ([place])    Stave IV breast cancer; radiation and tamoxifen  Overview:  Left breast ca with mets bone  Overview:  METS TO BONE  . GERD (gastroesophageal reflux disease)   . Hypertension     . Macular degeneration   . Multiple myeloma ([place]) 02/18/2017    Past Surgical History:  Procedure Laterality Date  . BACK SURGERY    . HERNIA REPAIR    . right arm surgery      Family History  Problem Relation Age of Onset  . Stroke Mother   . Cancer Maternal Aunt     cancer NOS; died in her 92s  . Lung cancer Maternal Uncle     smoker    Social History   Social History  . Marital status: Married    Spouse name: N/A  . Number of children: 3  . Years of education: N/A   Social History Main Topics  . Smoking status: Never Smoker  . Smokeless tobacco: Former Systems developer  . Alcohol use 14.4 oz/week    24 Cans of beer per week  . Drug use: No  . Sexual activity: Not Asked   Other Topics Concern  . None   Social History  Narrative  . None     PHYSICAL EXAMINATION  ECOG PERFORMANCE STATUS: 1 - Symptomatic but completely ambulatory  Vitals:   05/03/17 1420  BP: 119/68  Pulse: 67  Resp: 18  Temp: 98.8 F (37.1 C)    GENERAL:alert, no distress, well nourished, well developed, comfortable, cooperative, obese, smiling and accompanied by wife. SKIN: skin color, texture, turgor are normal, no rashes or significant lesions HEAD: Normocephalic, No masses, lesions, tenderness or abnormalities EYES: normal, EOMI, Conjunctiva are pink and non-injected EARS: External ears normal OROPHARYNX:lips, buccal mucosa, and tongue normal and mucous membranes are moist  NECK: supple, no adenopathy, trachea midline LYMPH:  no palpable lymphadenopathy BREAST:not examined LUNGS: clear to auscultation  HEART: regular rate & rhythm, no murmurs and no gallops ABDOMEN:abdomen soft, normal bowel sounds and resolved rash from Velcade noted. BACK: Back symmetric, no curvature., No CVA tenderness EXTREMITIES:less then 2 second capillary refill, no joint deformities, effusion, or inflammation, no skin discoloration, no cyanosis  NEURO: alert & oriented x 3 with fluent speech, no focal  motor/sensory deficits, gait normal   LABORATORY DATA: CBC    Component Value Date/Time   WBC 2.3 (L) 05/03/2017 1336   RBC 2.89 (L) 05/03/2017 1336   HGB 9.5 (L) 05/03/2017 1336   HCT 28.2 (L) 05/03/2017 1336   PLT 143 (L) 05/03/2017 1336   MCV 97.6 05/03/2017 1336   MCH 32.9 05/03/2017 1336   MCHC 33.7 05/03/2017 1336   RDW 16.2 (H) 05/03/2017 1336   LYMPHSABS 0.4 (L) 05/03/2017 1336   MONOABS 0.1 05/03/2017 1336   EOSABS 0.0 05/03/2017 1336   BASOSABS 0.0 05/03/2017 1336      Chemistry      Component Value Date/Time   NA 136 05/03/2017 1336   K 4.2 05/03/2017 1336   CL 107 05/03/2017 1336   CO2 25 05/03/2017 1336   BUN 18 05/03/2017 1336   CREATININE 1.40 (H) 05/03/2017 1336      Component Value Date/Time   CALCIUM 9.2 05/03/2017 1336   ALKPHOS 25 (L) 05/03/2017 1336   AST 30 05/03/2017 1336   ALT 27 05/03/2017 1336   BILITOT 0.3 05/03/2017 1336     Lab Results  Component Value Date   PROT 6.6 05/03/2017   ALBUMINELP 3.7 04/12/2017   A1GS 0.2 04/12/2017   A2GS 0.6 04/12/2017   BETS 0.8 04/12/2017   GAMS 1.3 04/12/2017   MSPIKE 1.0 (H) 04/12/2017   SPEI Comment 04/12/2017   SPECOM Comment 04/12/2017   IGGSERUM 1,263 04/12/2017   IGGSERUM 1,308 04/12/2017   IGA 23 (L) 04/12/2017   IGA 24 (L) 04/12/2017   IGMSERUM 17 (L) 04/12/2017   IGMSERUM 18 (L) 04/12/2017   KPAFRELGTCHN 1,231.0 (H) 03/15/2017   LAMBDASER 8.7 03/15/2017   KAPLAMBRATIO 141.49 (H) 03/15/2017     PENDING LABS:   RADIOGRAPHIC STUDIES:  US Breast Ltd Uni Left Inc Axilla  Result Date: 04/06/2017 CLINICAL DATA:  71 year old male with an history of biopsy proven left breast cancer presents for bilateral mammogram. Recent progress note from the patient's visit at the Medical Oncology/Hematology clinic at [facility] dated 03/15/2017 was reviewed. Prior bilateral mammogram performed at [facility] in [place], [place] on 12/31/2009 an a post biopsy clip  mammogram of the left breast dated 12/31/2009 are available and reviewed. The patient tells me that prior to his breast cancer diagnosis on the left he was evaluated by a surgeon for a tender retroareolar left breast lump and subsequently had surgery,  with benign results. In discussion with the patient, it sounds as this may have been surgery for symptomatic relief of gynecomastia. Please note that the patient reports that he did not have a surgery to remove the biopsy-proven left breast cancer after its was diagnosed. The patient has been taking tamoxifen since 2011. The patient has been diagnosed with multiple myeloma following bone biopsy. EXAM: 2D DIGITAL DIAGNOSTIC BILATERAL MAMMOGRAM WITH CAD AND ADJUNCT TOMO ULTRASOUND LEFT BREAST COMPARISON:  12/31/2009 ACR Breast Density Category b: There are scattered areas of fibroglandular density. FINDINGS: There is mild retroareolar right gynecomastia. The gynecomastia has decreased in prominence since the mammogram of 2011. There is no mass, architectural distortion, or suspicious microcalcification in the right breast to suggest malignancy. In the left breast, there is surgical scarring superior to the nipple in the central left breast consistent with reported history of prior benign surgical biopsy of the left breast. Deep to this scar is a spiculated mass containing a wing shaped biopsy clip. This wing shaped biopsy clip was placed in January 2011 at the time of the ultrasound-guided biopsy and appears unchanged in position compared to the post clip mammogram of January 2011. The spiculated mass in the the 11 to 12 o'clock retroareolar left breast has significantly decreased in size and density since the mammogram of 2011 consistent with interval antiestrogen therapy. Mammographic images were processed with CAD. On physical exam, there is a healed surgical scar in the superior left breast (from prior benign surgical biopsy, performed before the patient's diagnosis  of left breast cancer, from my discussion with the patient). I do not palpate a firm or suspicious mass in the retroareolar left breast. Targeted ultrasound is performed, showing a hypoechoic irregular mass is seen in the retroareolar left breast at 12 o'clock position measuring 0.9 x 0.7 x 1.1 cm. On the prior ultrasound of 2011, per records in [facility], the mass measured 2.4 x 1.9 x 3.0 cm. No discrete vascular flow is identified within the hypoechoic mass. There is some flow peripheral to the mass. Ultrasound of the left axilla is negative for lymphadenopathy. IMPRESSION: 1. Significant decrease in size of the biopsy proven malignancy in the 12 o'clock retroareolar left breast, consistent with positive response to antiestrogen therapy with tamoxifen since 2011. On ultrasound, the mass currently measures 0.9 x 0.7 x 1.1 cm. The wing shaped biopsy clip placed at the time of the ultrasound biopsy in 2011 remains within the mass. Please note that the patient's left breast surgery was prior to the diagnosis of left breast cancer, and the biopsied mass has not been surgically removed. 2. No evidence of malignancy in the right breast. Mild right gynecomastia, which has decreased in prominence since the prior mammogram of 2011. RECOMMENDATION: Bilateral diagnostic mammogram is recommended in 1 year, or earlier as clinically indicated. I have discussed the findings and recommendations with the patient. Results were also provided in writing at the conclusion of the visit. If applicable, a reminder letter will be sent to the patient regarding the next appointment. BI-RADS CATEGORY  6: Known biopsy-proven malignancy. Electronically Signed   By: Curlene Dolphin M.D.   On: 04/06/2017 16:24   Mm Diag Breast Tomo Bilateral  Result Date: 04/06/2017 CLINICAL DATA:  71 year old male with an history of biopsy proven left breast cancer presents for bilateral mammogram. Recent progress note from the patient's visit at the Medical  Oncology/Hematology clinic at [facility] dated 03/15/2017 was reviewed. Prior bilateral mammogram performed at [facility]  in [place], [place] on 12/31/2009 an a post biopsy clip mammogram of the left breast dated 12/31/2009 are available and reviewed. The patient tells me that prior to his breast cancer diagnosis on the left he was evaluated by a surgeon for a tender retroareolar left breast lump and subsequently had surgery, with benign results. In discussion with the patient, it sounds as this may have been surgery for symptomatic relief of gynecomastia. Please note that the patient reports that he did not have a surgery to remove the biopsy-proven left breast cancer after its was diagnosed. The patient has been taking tamoxifen since 2011. The patient has been diagnosed with multiple myeloma following bone biopsy. EXAM: 2D DIGITAL DIAGNOSTIC BILATERAL MAMMOGRAM WITH CAD AND ADJUNCT TOMO ULTRASOUND LEFT BREAST COMPARISON:  12/31/2009 ACR Breast Density Category b: There are scattered areas of fibroglandular density. FINDINGS: There is mild retroareolar right gynecomastia. The gynecomastia has decreased in prominence since the mammogram of 2011. There is no mass, architectural distortion, or suspicious microcalcification in the right breast to suggest malignancy. In the left breast, there is surgical scarring superior to the nipple in the central left breast consistent with reported history of prior benign surgical biopsy of the left breast. Deep to this scar is a spiculated mass containing a wing shaped biopsy clip. This wing shaped biopsy clip was placed in January 2011 at the time of the ultrasound-guided biopsy and appears unchanged in position compared to the post clip mammogram of January 2011. The spiculated mass in the the 11 to 12 o'clock retroareolar left breast has significantly decreased in size and density since the mammogram of 2011 consistent with interval  antiestrogen therapy. Mammographic images were processed with CAD. On physical exam, there is a healed surgical scar in the superior left breast (from prior benign surgical biopsy, performed before the patient's diagnosis of left breast cancer, from my discussion with the patient). I do not palpate a firm or suspicious mass in the retroareolar left breast. Targeted ultrasound is performed, showing a hypoechoic irregular mass is seen in the retroareolar left breast at 12 o'clock position measuring 0.9 x 0.7 x 1.1 cm. On the prior ultrasound of 2011, per records in [facility], the mass measured 2.4 x 1.9 x 3.0 cm. No discrete vascular flow is identified within the hypoechoic mass. There is some flow peripheral to the mass. Ultrasound of the left axilla is negative for lymphadenopathy. IMPRESSION: 1. Significant decrease in size of the biopsy proven malignancy in the 12 o'clock retroareolar left breast, consistent with positive response to antiestrogen therapy with tamoxifen since 2011. On ultrasound, the mass currently measures 0.9 x 0.7 x 1.1 cm. The wing shaped biopsy clip placed at the time of the ultrasound biopsy in 2011 remains within the mass. Please note that the patient's left breast surgery was prior to the diagnosis of left breast cancer, and the biopsied mass has not been surgically removed. 2. No evidence of malignancy in the right breast. Mild right gynecomastia, which has decreased in prominence since the prior mammogram of 2011. RECOMMENDATION: Bilateral diagnostic mammogram is recommended in 1 year, or earlier as clinically indicated. I have discussed the findings and recommendations with the patient. Results were also provided in writing at the conclusion of the visit. If applicable, a reminder letter will be sent to the patient regarding the next appointment. BI-RADS CATEGORY  6: Known biopsy-proven malignancy. Electronically Signed   By: Curlene Dolphin M.D.   On: 04/06/2017 16:24     PATHOLOGY:  ASSESSMENT AND PLAN:  Multiple myeloma ([place]) Multiple myeloma with bone marrow aspiration and biopsy on 02/02/2017 demonstrating 66% plasma cells (on aspirate and 30% on CD 138) after a left ilium bone biopsy demonstrated a plasma cell neoplasm (performed due to concern for progression of disease of breast cancer).  Now on RVD therapy beginning on 03/01/2017.  Oncology history updated.  Labs today: CBC diff, CMET, LDH, ESR, CRP, SPEP+IFE, light chain assay, B2M.  I personally reviewed and went over laboratory results with the patient.  The results are noted within this dictation.  Labs satisfy treatment parameters today.  He is to continue with ASA daily to reduce the risk of VTE (Revlimid-induced). He is to continue with Acyclovir to reduce the risk of zoster reactivation.  While getting therapy today, nursing will monitor patient closely for any acute toxicities in accordance with chemotherapy protocol.  I personally reviewed and went over laboratory results with the patient.  The results are noted within this dictation.  M-Spike has declined from 2.3 to 1.0.  IgG from 3225 to 1308.  Kappa free light chain and ratio have declined as well.  All of the abovementioned lab work is ordered today as well for ongoing comparison.  I personally reviewed and went over radiographic studies with the patient.  The results are noted within this dictation.  I personally reviewed the images in PACS.  Multiple bony lesions are noted.  Continue with RVD.  He has been experiencing a Velcade-induced rash.  I have discussed with Dr. Norma Fredrickson in the past about this and he recommended Claritin the day before, day of, and day after Velcade to see if this helps.  Patient notified of this recommendation and he has tried this today.  Given his response to therapy, will refer the patient to Dr. Norma Fredrickson for consideration of BMT.  I do not see that this referral has been made yet.  He is starting cycle #4 today.  This is  my first time formally meeting Johnny Lucas who is very nice and pleasant.  I reviewed the role of BMT in his multiple myeloma care and treatment.  Return in 3-4 weeks for ongoing follow-up and treatment.  45 minutes was spent in face-to-face encounter with the patient and his wife discussing role of BMT and referral to [facility] for consultation.  More than 50% of the time spent with the patient was utilized for counseling and coordination of care.   Breast cancer, male ([place]) Stage IV infiltrating ductal carcinoma of left breast, ER+, HER2 negative, on Tamoxifen, diagnosed and managed at [facility] by Dr. Jacquiline Doe in [place], [place].  At time of Dx, bone scan demonstrated multiple boney lesions requiring rod placement in R humerus by Dr. [name] at [facility] ortho with right humerus canal curettage positive for ER+ metastatic breast cancer in 2011.  He has never had lumpectomy/mastectomy to date.  Oncology history is up to date.  Labs today: CBC diff, CMET.  I personally reviewed and went over laboratory results with the patient.  The results are noted within this dictation.  Ongoing compliance with Tamoxifen is recommended.  Risks, benefits, alternatives, and side effects of Tamoxifen reviewed: arthralgias, myalgias, hot flashes, increased risk for VTE.  Bone metastases ([place]) Bone lesions, right humerus confirmed to be from breast cancer (2011), left ileal bone lesion biopsy proven to be multiple myeloma (2018).  On Xgeva to reduce SRE.  Due for Xgeva next week, if labs permit.  Continue with Calcium and Vit D supplementation.  Number of Diagnoses or Treatment Options- Section A:      Problems to Exam Physician Problem(s) Number x Points= Results  Self-limited or minor (stable, improved, or worsening)  Max=2  1   Est. Problem (to examiner); stable, improved   1 2  Est. Problem (to examiner); worsening   2   New problem (to examiner); no additional work-up planned  Max=1 3   New problem (to examiner); add.  work-up planned   4      Total: 2    Amount and/or Complexity of Data to be Reviewed- Section B    Data to be reviewed: Points    Review and/or order of clinical lab tests 1  '[x]'$    Review and/or order of tests in the radiology section of CPT (includes nuclear med & other except cardiac cath & ECG) 1  '[]'$    Review and/or order of tests in the medicine section of CPT (e.g. EKG, cardiac cath, non-invasive vascular studies, pulmonary function studies) 1  '[]'$    Discussion of test results with performing physician 1  '[]'$    Decision to obtain old records and/or obtaining history from someone other than patient 1  '[]'$    Review and summarization of old records and/or obtaining history from someone other than patient and/or discussion of case with another health care provider 2  '[x]'$    Independent visualization of image, tracing, or specimen itself (not simply review report) 2  '[]'$    Total:  3     Risk of  complications and/or Morbidity or Mortality- Section C  Level of Risk: Presenting Problem(s) Diagnostic Procedure(s) Ordered Management Options Selected  Minimal One self-limited or minor problem (eg cold, insect bite, tinea corporis)  Lab test requiring venipuncture  Chest xray  EKG/EEG  [place] or Echo  KOH prep  Urinalysis  Rest  Gargles  Elastic bandages  Superficial dressings  Low  Two or more self-limited or minor problems  One stable chronic illness (well-controlled HTN, non-insulin dependent diabetes, cataract, BPH)  Acute uncomplicated illness or injury (cystitis, allergic rhinitis, simple sprain)  Physiologic test not under stress (pulm fnx tests)  Non-cardiovascular imaging studies with contrast (barium enema)  Superficial needle biopsy  Clinical laboratory tests requiring arterial puncture  Skin biopsies  OTC drugs  Minor surgery with no identified risk factors  Physical therapy  Occupational therapy  IV fluids without additives  Moderate  One or more chronic  illnesses with mild exacerbation, progression, or side effects of treatment  Two or more stable chronic illnesses  Undiagnosed new problem with uncertain prognosis (lump in breast)  Acute illness with systemic symptoms (pyelonephritis, pneumonitis, colitis)  Acute complicated injury (head injury with brief loss of consciousness)  Physiologic test under stress (cardiac stress test, fetal contraction stress test)  Diagnostic endoscopies with no identified risk factors  Deep needle or incisional biopsy  Cardiovascular imaging studies with contrast and no identified risk factors (arteriogram, cardiac cath)  Obtain fluid from body cavity (LP, thoracentesis, culdocentesis)  Minor surgery with identified risk factors  Elective surgery (open, percutaneous, or endoscopic) with no identified risk factors  Prescription drug management  Therapeutic nuclear medicine  IV fluids with additives  Closed treatment of fracture of dislocation without manipulation  High  One or more chronic illnesses with severe exacerbation, progression, or side effects of treatment.  Acute or chronic illnesses or injuries that may pose a threat to life or bodily function (multiple trauma, acute MI, PE, severe respiratory distress, progressive severe rheumatoid arthritis, psychiatric  illness with potential threat to self or others, peritonitis, acute renal failure)  An abrupt change in neurological status (seizure, TIA, weakness, sensory loss)  Cardiovascular imaging studies with contrast with identified risk factors  Cardiac electrophysiological tests  Diagnostic endoscopies with identified risk factors  Discography   Elective major surgery (open, percutaneous, or endoscopic) with identified risk factor  Emergency major surgery (open, percutaneous, or endoscopic)  Parental controlled substances  Drug therapy requiring intensive monitoring for toxicity  Decision not to resuscitate or deescalate care  because of poor prognosis     Final Result of Complexity      Choose decision making level with 2 or 3 checks OR choose the decision making level on Section B       A Number of diagnoses or treatment options  '[]'$   </= 1 Minimal  '[x]'$   2 Limited  '[]'$   3 Multiple  '[]'$   >/= 4 Extensive  B Amount and complexity of data  '[]'$   </= 1 Minimal or low  '[]'$   2 Limited  '[x]'$   3  Moderate  '[]'$   >/= 4 Extensive  C Highest risk  '[]'$   Minimal  '[]'$   Low  '[]'$   Moderate  '[x]'$   High   Type of decision making  '[]'$   Straight-forward  '[]'$   Low Complexity  '[x]'$   Moderate- Complexity  '[]'$   High- Complexity     ORDERS PLACED FOR THIS ENCOUNTER: Orders Placed This Encounter  Procedures  . Sedimentation rate  . C-reactive protein  . IgG, IgA, IgM  . Immunofixation electrophoresis  . Protein electrophoresis, serum    MEDICATIONS PRESCRIBED THIS ENCOUNTER: Meds ordered this encounter  Medications  . sildenafil (REVATIO) 20 MG tablet    Sig: TAKE UP TO FIVE TABLETS BY MOUTH EVERY DAY AS NEEDED    Refill:  0    THERAPY PLAN:  Continue with RVD as planned.  Will refer to [facility] for consideration of bone marrow transplant.  All questions were answered. The patient knows to call the clinic with any problems, questions or concerns. We can certainly see the patient much sooner if necessary.  Patient and plan discussed with Dr. Twana First and she is in agreement with the aforementioned.   This note is electronically signed by: Doy Mince 05/03/2017 5:19 PM

## 2017-05-03 NOTE — Patient Instructions (Signed)
[facility] Discharge Instructions for Patients Receiving Chemotherapy   Beginning January 23rd 2017 lab work for the Cancer Center will be done in the  Main lab at [place] on 1st floor. If you have a lab appointment with the Cancer Center please come in thru the  Main Entrance and check in at the main information desk   Today you received the following chemotherapy agents Velcade injection. Follow-up as scheduled. Call clinic for any questions or concerns  To help prevent nausea and vomiting after your treatment, we encourage you to take your nausea medication   If you develop nausea and vomiting, or diarrhea that is not controlled by your medication, call the clinic.  The clinic phone number is [phone]. Office hours are Monday-Friday 8:30am-5:00pm.  BELOW ARE SYMPTOMS THAT SHOULD BE REPORTED IMMEDIATELY:  *FEVER GREATER THAN 101.0 F  *CHILLS WITH OR WITHOUT FEVER  NAUSEA AND VOMITING THAT IS NOT CONTROLLED WITH YOUR NAUSEA MEDICATION  *UNUSUAL SHORTNESS OF BREATH  *UNUSUAL BRUISING OR BLEEDING  TENDERNESS IN MOUTH AND THROAT WITH OR WITHOUT PRESENCE OF ULCERS  *URINARY PROBLEMS  *BOWEL PROBLEMS  UNUSUAL RASH Items with * indicate a potential emergency and should be followed up as soon as possible. If you have an emergency after office hours please contact your primary care physician or go to the nearest emergency department.  Please call the clinic during office hours if you have any questions or concerns.   You may also contact the Patient Navigator at [phone] should you have any questions or need assistance in obtaining follow up care.      Resources For Cancer Patients and their Caregivers ? American Cancer Society: Can assist with transportation, wigs, general needs, runs Look Good Feel Better.        1-888-227-6333 ? Cancer Care: Provides financial assistance, online support groups, medication/co-pay assistance.   1-800-813-HOPE (4673) ? [facility] Assists [place] cancer patients and their families through emotional , educational and financial support.  336-427-4357 ? [place] DSS Where to apply for food stamps, Medicaid and utility assistance. 336-342-1394 ? RCATS: Transportation to medical appointments. 336-347-2287 ? Social Security Administration: May apply for disability if have a Stage IV cancer. 336-342-7796 1-800-772-1213 ? [place] Aging, Disability and Transit Services: Assists with nutrition, care and transit needs. 336-349-2343         

## 2017-05-03 NOTE — Assessment & Plan Note (Signed)
Bone lesions, right humerus confirmed to be from breast cancer (2011), left ileal bone lesion biopsy proven to be multiple myeloma (2018).  On Xgeva to reduce SRE.  Due for Xgeva next week, if labs permit.  Continue with Calcium and Vit D supplementation.

## 2017-05-03 NOTE — Progress Notes (Signed)
Pt currently taking Revlimid. Pt states that he is taking the medication correctly and according to his calendar. The pt denies any problems or side effects from medication.

## 2017-05-03 NOTE — Assessment & Plan Note (Addendum)
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 Jacquiline Doe in [place], [place].  At time of Dx, bone scan demonstrated multiple boney lesions requiring rod placement in R humerus by Dr. [name] at [facility] ortho with right humerus canal curettage positive for ER+ metastatic breast cancer in 2011.  He has never had lumpectomy/mastectomy to date.  Oncology history is up to date.  Labs today: CBC diff, CMET.  I personally reviewed and went over laboratory results with the patient.  The results are noted within this dictation.  Ongoing compliance with Tamoxifen is recommended.  Risks, benefits, alternatives, and side effects of Tamoxifen reviewed: arthralgias, myalgias, hot flashes, increased risk for VTE.

## 2017-05-03 NOTE — Patient Instructions (Addendum)
[place] at [facility] Discharge Instructions  RECOMMENDATIONS MADE BY THE CONSULTANT AND ANY TEST RESULTS WILL BE SENT TO YOUR REFERRING PHYSICIAN.  You were seen today by Kirby Crigler PA-C. Referring you to [facility] to see Dr. Norma Fredrickson. Velcade as scheduled. Continue taking Revlimid and Dexamethasone as scheduled. Continue weekly labs.  Return in 3 weeks for follow up.   Thank you for choosing [place] at [facility] to provide your oncology and hematology care.  To afford each patient quality time with our provider, please arrive at least 15 minutes before your scheduled appointment time.    If you have a lab appointment with the [facility] please come in thru the  Main Entrance and check in at the main information desk  You need to re-schedule your appointment should you arrive 10 or more minutes late.  We strive to give you quality time with our providers, and arriving late affects you and other patients whose appointments are after yours.  Also, if you no show three or more times for appointments you may be dismissed from the clinic at the providers discretion.     Again, thank you for choosing [facility].  Our hope is that these requests will decrease the amount of time that you wait before being seen by our physicians.       _____________________________________________________________  Should you have questions after your visit to [facility], please contact our office at [phone] between the hours of 8:30 a.m. and 4:30 p.m.  Voicemails left after 4:30 p.m. will not be returned until the following business day.  For prescription refill requests, have your pharmacy contact our office.       Resources For Cancer Patients and their Caregivers ? American Cancer Society: Can assist with transportation, wigs, general needs, runs Look Good Feel Better.        (240) 602-0578 ? Cancer Care: Provides  financial assistance, online support groups, medication/co-pay assistance.  1-800-813-HOPE 941-139-8488) ? [place] Assists [place] cancer patients and their families through emotional , educational and financial support.  267 408 5801 ? [place] DSS Where to apply for food stamps, Medicaid and utility assistance. 253-808-1959 ? RCATS: Transportation to medical appointments. (989)658-5095 ? Social Security Administration: May apply for disability if have a Stage IV cancer. 5198719568 [phone] ? [employer], Disability and Transit Services: Assists with nutrition, care and transit needs. [name] Support Programs: @10RELATIVEDAYS @ > Cancer Support Group  2nd Tuesday of the month 1pm-2pm, Journey Room  > Creative Journey  3rd Tuesday of the month 1130am-1pm, Journey Room  > Look Good Feel Better  1st Wednesday of the month 10am-12 noon, Journey Room (Call [place] to register [phone])

## 2017-05-03 NOTE — Progress Notes (Signed)
Johnny Lucas tolerated Velcade injection well without complaints or incident.Labs reviewed prior to administering Velcade Pt discharged self ambulatory in satisfactory condition accompanied by his wife

## 2017-05-04 LAB — PROTEIN ELECTROPHORESIS, SERUM
A/G RATIO SPE: 1.3 (ref 0.7–1.7)
ALPHA-2-GLOBULIN: 0.6 g/dL (ref 0.4–1.0)
Albumin ELP: 3.5 g/dL (ref 2.9–4.4)
Alpha-1-Globulin: 0.2 g/dL (ref 0.0–0.4)
Beta Globulin: 0.8 g/dL (ref 0.7–1.3)
GLOBULIN, TOTAL: 2.6 g/dL (ref 2.2–3.9)
Gamma Globulin: 1 g/dL (ref 0.4–1.8)
M-SPIKE, %: 0.8 g/dL — AB
TOTAL PROTEIN ELP: 6.1 g/dL (ref 6.0–8.5)

## 2017-05-04 LAB — KAPPA/LAMBDA LIGHT CHAINS
KAPPA FREE LGHT CHN: 696.7 mg/L — AB (ref 3.3–19.4)
Kappa, lambda light chain ratio: 92.89 — ABNORMAL HIGH (ref 0.26–1.65)
Lambda free light chains: 7.5 mg/L (ref 5.7–26.3)

## 2017-05-04 LAB — IGG, IGA, IGM
IgA: 20 mg/dL — ABNORMAL LOW (ref 61–437)
IgG (Immunoglobin G), Serum: 1002 mg/dL (ref 700–1600)
IgM, Serum: 16 mg/dL — ABNORMAL LOW (ref 20–172)

## 2017-05-04 LAB — BETA 2 MICROGLOBULIN, SERUM: BETA 2 MICROGLOBULIN: 3.2 mg/L — AB (ref 0.6–2.4)

## 2017-05-06 ENCOUNTER — Ambulatory Visit (HOSPITAL_COMMUNITY): Payer: Medicare Other

## 2017-05-06 ENCOUNTER — Other Ambulatory Visit (HOSPITAL_COMMUNITY): Payer: Medicare Other

## 2017-05-10 ENCOUNTER — Encounter (HOSPITAL_COMMUNITY): Payer: Medicare Other

## 2017-05-10 ENCOUNTER — Encounter (HOSPITAL_COMMUNITY): Payer: Self-pay

## 2017-05-10 ENCOUNTER — Encounter (HOSPITAL_BASED_OUTPATIENT_CLINIC_OR_DEPARTMENT_OTHER): Payer: Medicare Other

## 2017-05-10 ENCOUNTER — Other Ambulatory Visit (HOSPITAL_COMMUNITY): Payer: Self-pay | Admitting: Oncology

## 2017-05-10 VITALS — BP 119/69 | HR 67 | Temp 98.2°F | Wt 208.0 lb

## 2017-05-10 DIAGNOSIS — C50922 Malignant neoplasm of unspecified site of left male breast: Secondary | ICD-10-CM | POA: Diagnosis not present

## 2017-05-10 DIAGNOSIS — C9000 Multiple myeloma not having achieved remission: Secondary | ICD-10-CM | POA: Diagnosis not present

## 2017-05-10 DIAGNOSIS — Z5112 Encounter for antineoplastic immunotherapy: Secondary | ICD-10-CM | POA: Diagnosis present

## 2017-05-10 DIAGNOSIS — C7951 Secondary malignant neoplasm of bone: Secondary | ICD-10-CM | POA: Diagnosis not present

## 2017-05-10 DIAGNOSIS — Z170 Estrogen receptor positive status [ER+]: Secondary | ICD-10-CM | POA: Diagnosis not present

## 2017-05-10 LAB — CBC WITH DIFFERENTIAL/PLATELET
BASOS ABS: 0 10*3/uL (ref 0.0–0.1)
BASOS PCT: 0 %
Eosinophils Absolute: 0.1 10*3/uL (ref 0.0–0.7)
Eosinophils Relative: 2 %
HEMATOCRIT: 27.2 % — AB (ref 39.0–52.0)
Hemoglobin: 9.2 g/dL — ABNORMAL LOW (ref 13.0–17.0)
Lymphocytes Relative: 10 %
Lymphs Abs: 0.5 10*3/uL — ABNORMAL LOW (ref 0.7–4.0)
MCH: 33.3 pg (ref 26.0–34.0)
MCHC: 33.8 g/dL (ref 30.0–36.0)
MCV: 98.6 fL (ref 78.0–100.0)
MONO ABS: 0.1 10*3/uL (ref 0.1–1.0)
Monocytes Relative: 2 %
NEUTROS ABS: 3.9 10*3/uL (ref 1.7–7.7)
Neutrophils Relative %: 86 %
PLATELETS: 138 10*3/uL — AB (ref 150–400)
RBC: 2.76 MIL/uL — ABNORMAL LOW (ref 4.22–5.81)
RDW: 16.6 % — AB (ref 11.5–15.5)
WBC: 4.6 10*3/uL (ref 4.0–10.5)

## 2017-05-10 LAB — COMPREHENSIVE METABOLIC PANEL
ALBUMIN: 3.5 g/dL (ref 3.5–5.0)
ALT: 23 U/L (ref 17–63)
ANION GAP: 4 — AB (ref 5–15)
AST: 18 U/L (ref 15–41)
Alkaline Phosphatase: 22 U/L — ABNORMAL LOW (ref 38–126)
BILIRUBIN TOTAL: 0.5 mg/dL (ref 0.3–1.2)
BUN: 15 mg/dL (ref 6–20)
CO2: 26 mmol/L (ref 22–32)
Calcium: 8.8 mg/dL — ABNORMAL LOW (ref 8.9–10.3)
Chloride: 106 mmol/L (ref 101–111)
Creatinine, Ser: 1.24 mg/dL (ref 0.61–1.24)
GFR calc Af Amer: >60 mL/min (ref >60)
GFR calc non Af Amer: 58 mL/min — ABNORMAL LOW (ref >60)
GLUCOSE: 123 mg/dL — AB (ref 65–99)
POTASSIUM: 4.1 mmol/L (ref 3.5–5.1)
SODIUM: 136 mmol/L (ref 135–145)
TOTAL PROTEIN: 6.3 g/dL — AB (ref 6.5–8.1)

## 2017-05-10 LAB — IMMUNOFIXATION ELECTROPHORESIS
IGG (IMMUNOGLOBIN G), SERUM: 924 mg/dL (ref 700–1600)
IgA: 20 mg/dL — ABNORMAL LOW (ref 61–437)
IgM, Serum: 15 mg/dL — ABNORMAL LOW (ref 20–172)
TOTAL PROTEIN ELP: 6.1 g/dL (ref 6.0–8.5)

## 2017-05-10 MED ORDER — PROCHLORPERAZINE MALEATE 10 MG PO TABS
ORAL_TABLET | ORAL | Status: AC
  Filled 2017-05-10: qty 1

## 2017-05-10 MED ORDER — ALPRAZOLAM 0.5 MG PO TABS: 0.5000 mg | ORAL_TABLET | Freq: Every evening | ORAL | 1 refills | Status: DC | PRN

## 2017-05-10 MED ORDER — BORTEZOMIB CHEMO SQ INJECTION 3.5 MG (2.5MG/ML)
1.3000 mg/m2 | Freq: Once | INTRAMUSCULAR | Status: AC
  Administered 2017-05-10: 2.75 mg via SUBCUTANEOUS
  Filled 2017-05-10: qty 2.75

## 2017-05-10 MED ORDER — PROCHLORPERAZINE MALEATE 10 MG PO TABS
10.0000 mg | ORAL_TABLET | Freq: Once | ORAL | Status: AC
  Administered 2017-05-10: 10 mg via ORAL

## 2017-05-10 MED ORDER — LENALIDOMIDE 25 MG PO CAPS: ORAL_CAPSULE | ORAL | 0 refills | Status: DC

## 2017-05-10 NOTE — Progress Notes (Signed)
Johnny Lucas presents today for injection per MD orders. Velcade 2.75mg   administered SQ in left Abdomen. Administration without incident. Patient tolerated well. Vitals stable and discharged home from clinic ambulatory, follow up as scheduled.

## 2017-05-10 NOTE — Patient Instructions (Signed)
[facility] Discharge Instructions for Patients Receiving Chemotherapy   Beginning January 23rd 2017 lab work for the Cancer Center will be done in the  Main lab at [place] on 1st floor. If you have a lab appointment with the Cancer Center please come in thru the  Main Entrance and check in at the main information desk   Today you received the following chemotherapy agents   To help prevent nausea and vomiting after your treatment, we encourage you to take your nausea medication     If you develop nausea and vomiting, or diarrhea that is not controlled by your medication, call the clinic.  The clinic phone number is [phone]. Office hours are Monday-Friday 8:30am-5:00pm.  BELOW ARE SYMPTOMS THAT SHOULD BE REPORTED IMMEDIATELY:  *FEVER GREATER THAN 101.0 F  *CHILLS WITH OR WITHOUT FEVER  NAUSEA AND VOMITING THAT IS NOT CONTROLLED WITH YOUR NAUSEA MEDICATION  *UNUSUAL SHORTNESS OF BREATH  *UNUSUAL BRUISING OR BLEEDING  TENDERNESS IN MOUTH AND THROAT WITH OR WITHOUT PRESENCE OF ULCERS  *URINARY PROBLEMS  *BOWEL PROBLEMS  UNUSUAL RASH Items with * indicate a potential emergency and should be followed up as soon as possible. If you have an emergency after office hours please contact your primary care physician or go to the nearest emergency department.  Please call the clinic during office hours if you have any questions or concerns.   You may also contact the Patient Navigator at [phone] should you have any questions or need assistance in obtaining follow up care.      Resources For Cancer Patients and their Caregivers ? American Cancer Society: Can assist with transportation, wigs, general needs, runs Look Good Feel Better.        1-888-227-6333 ? Cancer Care: Provides financial assistance, online support groups, medication/co-pay assistance.  1-800-813-HOPE (4673) ? [facility] Assists [place] cancer  patients and their families through emotional , educational and financial support.  336-427-4357 ? [place] DSS Where to apply for food stamps, Medicaid and utility assistance. 336-342-1394 ? RCATS: Transportation to medical appointments. 336-347-2287 ? Social Security Administration: May apply for disability if have a Stage IV cancer. 336-342-7796 1-800-772-1213 ? [place] Aging, Disability and Transit Services: Assists with nutrition, care and transit needs. [phone]         

## 2017-05-11 ENCOUNTER — Ambulatory Visit (HOSPITAL_COMMUNITY): Payer: Medicare Other

## 2017-05-11 ENCOUNTER — Encounter (HOSPITAL_COMMUNITY): Payer: Medicare Other

## 2017-05-11 ENCOUNTER — Other Ambulatory Visit (HOSPITAL_COMMUNITY): Payer: Medicare Other

## 2017-05-11 NOTE — Progress Notes (Signed)
Patient not to get injection today r/t lab results per Robynn Pane, PA-C.  Patient aware and rescheduled for 28 days to have labs drawn again and Xgeva if calcium levels have corrected.

## 2017-05-17 ENCOUNTER — Encounter (HOSPITAL_BASED_OUTPATIENT_CLINIC_OR_DEPARTMENT_OTHER): Payer: Medicare Other

## 2017-05-17 ENCOUNTER — Encounter (HOSPITAL_COMMUNITY): Payer: Self-pay

## 2017-05-17 ENCOUNTER — Encounter (HOSPITAL_COMMUNITY): Payer: Medicare Other

## 2017-05-17 VITALS — BP 115/61 | HR 74 | Temp 98.1°F | Resp 18

## 2017-05-17 DIAGNOSIS — C9000 Multiple myeloma not having achieved remission: Secondary | ICD-10-CM

## 2017-05-17 DIAGNOSIS — Z5112 Encounter for antineoplastic immunotherapy: Secondary | ICD-10-CM

## 2017-05-17 DIAGNOSIS — C50922 Malignant neoplasm of unspecified site of left male breast: Secondary | ICD-10-CM | POA: Diagnosis not present

## 2017-05-17 DIAGNOSIS — C7951 Secondary malignant neoplasm of bone: Secondary | ICD-10-CM | POA: Diagnosis not present

## 2017-05-17 DIAGNOSIS — Z170 Estrogen receptor positive status [ER+]: Secondary | ICD-10-CM | POA: Diagnosis not present

## 2017-05-17 LAB — CBC WITH DIFFERENTIAL/PLATELET
Basophils Absolute: 0 10*3/uL (ref 0.0–0.1)
Basophils Relative: 0 %
EOS ABS: 0.1 10*3/uL (ref 0.0–0.7)
EOS PCT: 1 %
HCT: 27.5 % — ABNORMAL LOW (ref 39.0–52.0)
HEMOGLOBIN: 9.2 g/dL — AB (ref 13.0–17.0)
LYMPHS ABS: 0.5 10*3/uL — AB (ref 0.7–4.0)
Lymphocytes Relative: 8 %
MCH: 33.1 pg (ref 26.0–34.0)
MCHC: 33.5 g/dL (ref 30.0–36.0)
MCV: 98.9 fL (ref 78.0–100.0)
MONO ABS: 0.2 10*3/uL (ref 0.1–1.0)
MONOS PCT: 4 %
Neutro Abs: 4.7 10*3/uL (ref 1.7–7.7)
Neutrophils Relative %: 87 %
Platelets: 135 10*3/uL — ABNORMAL LOW (ref 150–400)
RBC: 2.78 MIL/uL — ABNORMAL LOW (ref 4.22–5.81)
RDW: 16.6 % — AB (ref 11.5–15.5)
WBC: 5.5 10*3/uL (ref 4.0–10.5)

## 2017-05-17 LAB — COMPREHENSIVE METABOLIC PANEL
ALK PHOS: 24 U/L — AB (ref 38–126)
ALT: 32 U/L (ref 17–63)
ANION GAP: 4 — AB (ref 5–15)
AST: 25 U/L (ref 15–41)
Albumin: 3.4 g/dL — ABNORMAL LOW (ref 3.5–5.0)
BUN: 13 mg/dL (ref 6–20)
CALCIUM: 8.5 mg/dL — AB (ref 8.9–10.3)
CO2: 26 mmol/L (ref 22–32)
Chloride: 107 mmol/L (ref 101–111)
Creatinine, Ser: 1.14 mg/dL (ref 0.61–1.24)
GFR calc non Af Amer: >60 mL/min (ref >60)
Glucose, Bld: 132 mg/dL — ABNORMAL HIGH (ref 65–99)
Potassium: 3.7 mmol/L (ref 3.5–5.1)
SODIUM: 137 mmol/L (ref 135–145)
TOTAL PROTEIN: 6.3 g/dL — AB (ref 6.5–8.1)
Total Bilirubin: 0.3 mg/dL (ref 0.3–1.2)

## 2017-05-17 MED ORDER — BORTEZOMIB CHEMO SQ INJECTION 3.5 MG (2.5MG/ML)
1.3000 mg/m2 | Freq: Once | INTRAMUSCULAR | Status: AC
  Administered 2017-05-17: 2.75 mg via SUBCUTANEOUS
  Filled 2017-05-17: qty 2.75

## 2017-05-17 MED ORDER — PROCHLORPERAZINE MALEATE 10 MG PO TABS
10.0000 mg | ORAL_TABLET | Freq: Once | ORAL | Status: AC
  Administered 2017-05-17: 10 mg via ORAL
  Filled 2017-05-17: qty 1

## 2017-05-17 NOTE — Progress Notes (Signed)
Johnny Lucas presents today for injection per the provider's orders.  Velcade administration without incident; see MAR for injection details.  Patient tolerated procedure well and without incident.  No questions or complaints noted at this time.  Discharged ambulatory.

## 2017-05-17 NOTE — Patient Instructions (Signed)
[facility] Discharge Instructions for Patients Receiving Chemotherapy   Beginning January 23rd 2017 lab work for the [facility] will be done in the  Main lab at [facility] on 1st floor. If you have a lab appointment with the [place] please come in thru the  Main Entrance and check in at the main information desk   Today you received the following chemotherapy agents:  Velcade  If you develop nausea and vomiting, or diarrhea that is not controlled by your medication, call the clinic.  The clinic phone number is [phone]. Office hours are Monday-Friday 8:30am-5:00pm.  BELOW ARE SYMPTOMS THAT SHOULD BE REPORTED IMMEDIATELY:  *FEVER GREATER THAN 101.0 F  *CHILLS WITH OR WITHOUT FEVER  NAUSEA AND VOMITING THAT IS NOT CONTROLLED WITH YOUR NAUSEA MEDICATION  *UNUSUAL SHORTNESS OF BREATH  *UNUSUAL BRUISING OR BLEEDING  TENDERNESS IN MOUTH AND THROAT WITH OR WITHOUT PRESENCE OF ULCERS  *URINARY PROBLEMS  *BOWEL PROBLEMS  UNUSUAL RASH Items with * indicate a potential emergency and should be followed up as soon as possible. If you have an emergency after office hours please contact your primary care physician or go to the nearest emergency department.  Please call the clinic during office hours if you have any questions or concerns.   You may also contact the Patient Navigator at [phone] should you have any questions or need assistance in obtaining follow up care.      Resources For Cancer Patients and their Caregivers ? American Cancer Society: Can assist with transportation, wigs, general needs, runs Look Good Feel Better.        [phone] ? Cancer Care: Provides financial assistance, online support groups, medication/co-pay assistance.  1-800-813-HOPE (334)590-4796) ? [facility] Assists [place] cancer patients and their families through emotional , educational and financial support.   651-375-1178 ? [place] DSS Where to apply for food stamps, Medicaid and utility assistance. (564) 075-0326 ? RCATS: Transportation to medical appointments. (647)257-9983 ? Social Security Administration: May apply for disability if have a Stage IV cancer. [phone] ? [employer], Disability and Transit Services: Assists with nutrition, care and transit needs. [phone]

## 2017-05-22 ENCOUNTER — Other Ambulatory Visit (HOSPITAL_COMMUNITY): Payer: Self-pay | Admitting: Adult Health

## 2017-05-22 DIAGNOSIS — C9000 Multiple myeloma not having achieved remission: Secondary | ICD-10-CM

## 2017-05-25 ENCOUNTER — Encounter (HOSPITAL_COMMUNITY): Payer: Medicare Other

## 2017-05-25 ENCOUNTER — Encounter (HOSPITAL_BASED_OUTPATIENT_CLINIC_OR_DEPARTMENT_OTHER): Payer: Medicare Other

## 2017-05-25 VITALS — BP 122/62 | HR 75 | Temp 98.5°F | Resp 18 | Wt 208.8 lb

## 2017-05-25 DIAGNOSIS — C9000 Multiple myeloma not having achieved remission: Secondary | ICD-10-CM

## 2017-05-25 DIAGNOSIS — C50922 Malignant neoplasm of unspecified site of left male breast: Secondary | ICD-10-CM | POA: Diagnosis not present

## 2017-05-25 DIAGNOSIS — C7951 Secondary malignant neoplasm of bone: Secondary | ICD-10-CM | POA: Diagnosis not present

## 2017-05-25 DIAGNOSIS — Z5112 Encounter for antineoplastic immunotherapy: Secondary | ICD-10-CM

## 2017-05-25 DIAGNOSIS — Z170 Estrogen receptor positive status [ER+]: Secondary | ICD-10-CM | POA: Diagnosis not present

## 2017-05-25 LAB — CBC WITH DIFFERENTIAL/PLATELET
Basophils Absolute: 0 10*3/uL (ref 0.0–0.1)
Basophils Relative: 0 %
EOS PCT: 0 %
Eosinophils Absolute: 0 10*3/uL (ref 0.0–0.7)
HEMATOCRIT: 26.9 % — AB (ref 39.0–52.0)
Hemoglobin: 8.9 g/dL — ABNORMAL LOW (ref 13.0–17.0)
LYMPHS PCT: 9 %
Lymphs Abs: 0.7 10*3/uL (ref 0.7–4.0)
MCH: 33.1 pg (ref 26.0–34.0)
MCHC: 33.1 g/dL (ref 30.0–36.0)
MCV: 100 fL (ref 78.0–100.0)
MONO ABS: 1.7 10*3/uL — AB (ref 0.1–1.0)
MONOS PCT: 21 %
NEUTROS ABS: 5.7 10*3/uL (ref 1.7–7.7)
Neutrophils Relative %: 70 %
PLATELETS: 151 10*3/uL (ref 150–400)
RBC: 2.69 MIL/uL — ABNORMAL LOW (ref 4.22–5.81)
RDW: 16 % — AB (ref 11.5–15.5)
WBC: 8.2 10*3/uL (ref 4.0–10.5)

## 2017-05-25 LAB — COMPREHENSIVE METABOLIC PANEL
ALT: 16 U/L — ABNORMAL LOW (ref 17–63)
ANION GAP: 7 (ref 5–15)
AST: 17 U/L (ref 15–41)
Albumin: 3.4 g/dL — ABNORMAL LOW (ref 3.5–5.0)
Alkaline Phosphatase: 22 U/L — ABNORMAL LOW (ref 38–126)
BILIRUBIN TOTAL: 0.3 mg/dL (ref 0.3–1.2)
BUN: 20 mg/dL (ref 6–20)
CO2: 24 mmol/L (ref 22–32)
Calcium: 8.8 mg/dL — ABNORMAL LOW (ref 8.9–10.3)
Chloride: 107 mmol/L (ref 101–111)
Creatinine, Ser: 1.2 mg/dL (ref 0.61–1.24)
GFR calc Af Amer: >60 mL/min (ref >60)
GFR, EST NON AFRICAN AMERICAN: 60 mL/min — AB (ref >60)
Glucose, Bld: 119 mg/dL — ABNORMAL HIGH (ref 65–99)
POTASSIUM: 4.4 mmol/L (ref 3.5–5.1)
Sodium: 138 mmol/L (ref 135–145)
TOTAL PROTEIN: 6.3 g/dL — AB (ref 6.5–8.1)

## 2017-05-25 MED ORDER — PROCHLORPERAZINE MALEATE 10 MG PO TABS
10.0000 mg | ORAL_TABLET | Freq: Once | ORAL | Status: AC
  Administered 2017-05-25: 10 mg via ORAL

## 2017-05-25 MED ORDER — BORTEZOMIB CHEMO SQ INJECTION 3.5 MG (2.5MG/ML)
1.3000 mg/m2 | Freq: Once | INTRAMUSCULAR | Status: AC
  Administered 2017-05-25: 2.75 mg via SUBCUTANEOUS
  Filled 2017-05-25: qty 2.75

## 2017-05-25 NOTE — Patient Instructions (Signed)
[facility] Discharge Instructions for Patients Receiving Chemotherapy   Beginning January 23rd 2017 lab work for the [facility] will be done in the  Main lab at [facility] on 1st floor. If you have a lab appointment with the [facility] please come in thru the  Main Entrance and check in at the main information desk   Today you received the following chemotherapy agents velcade injection.  To help prevent nausea and vomiting after your treatment, we encourage you to take your nausea medication as instructed.   If you develop nausea and vomiting, or diarrhea that is not controlled by your medication, call the clinic.  The clinic phone number is [phone]. Office hours are Monday-Friday 8:30am-5:00pm.  BELOW ARE SYMPTOMS THAT SHOULD BE REPORTED IMMEDIATELY:  *FEVER GREATER THAN 101.0 F  *CHILLS WITH OR WITHOUT FEVER  NAUSEA AND VOMITING THAT IS NOT CONTROLLED WITH YOUR NAUSEA MEDICATION  *UNUSUAL SHORTNESS OF BREATH  *UNUSUAL BRUISING OR BLEEDING  TENDERNESS IN MOUTH AND THROAT WITH OR WITHOUT PRESENCE OF ULCERS  *URINARY PROBLEMS  *BOWEL PROBLEMS  UNUSUAL RASH Items with * indicate a potential emergency and should be followed up as soon as possible. If you have an emergency after office hours please contact your primary care physician or go to the nearest emergency department.  Please call the clinic during office hours if you have any questions or concerns.   You may also contact the Patient Navigator at [phone] should you have any questions or need assistance in obtaining follow up care.      Resources For Cancer Patients and their Caregivers ? American Cancer Society: Can assist with transportation, wigs, general needs, runs Look Good Feel Better.        (337)653-6951 ? Cancer Care: Provides financial assistance, online support groups, medication/co-pay assistance.  1-800-813-HOPE 818-797-2928) ? [place] Assists [place] cancer patients and their families through emotional , educational and financial support.  509-842-4021 ? [place] DSS Where to apply for food stamps, Medicaid and utility assistance. 416-755-5908 ? RCATS: Transportation to medical appointments. 204-154-6612 ? Social Security Administration: May apply for disability if have a Stage IV cancer. 617-650-3131 985-802-7422 ? [employer], Disability and Transit Services: Assists with nutrition, care and transit needs. [phone]

## 2017-05-25 NOTE — Progress Notes (Signed)
Johnny Lucas presents today for injection per MD orders. Velcade administered SQ in left Abdomen. Administration without incident. Patient tolerated well.

## 2017-05-26 DIAGNOSIS — C9000 Multiple myeloma not having achieved remission: Secondary | ICD-10-CM | POA: Diagnosis not present

## 2017-05-27 DIAGNOSIS — Z8579 Personal history of other malignant neoplasms of lymphoid, hematopoietic and related tissues: Secondary | ICD-10-CM | POA: Diagnosis not present

## 2017-05-31 ENCOUNTER — Other Ambulatory Visit (HOSPITAL_COMMUNITY): Payer: Self-pay | Admitting: Emergency Medicine

## 2017-05-31 DIAGNOSIS — C9000 Multiple myeloma not having achieved remission: Secondary | ICD-10-CM

## 2017-05-31 MED ORDER — LENALIDOMIDE 25 MG PO CAPS: ORAL_CAPSULE | ORAL | 0 refills | Status: DC

## 2017-06-01 ENCOUNTER — Encounter (HOSPITAL_COMMUNITY): Payer: Self-pay | Admitting: Adult Health

## 2017-06-01 ENCOUNTER — Encounter (HOSPITAL_BASED_OUTPATIENT_CLINIC_OR_DEPARTMENT_OTHER): Payer: Medicare Other | Admitting: Adult Health

## 2017-06-01 ENCOUNTER — Encounter (HOSPITAL_COMMUNITY): Payer: Medicare Other

## 2017-06-01 ENCOUNTER — Encounter (HOSPITAL_COMMUNITY): Payer: Medicare Other | Attending: Oncology

## 2017-06-01 VITALS — BP 117/69 | HR 67 | Resp 16 | Ht 68.0 in | Wt 205.0 lb

## 2017-06-01 DIAGNOSIS — C50922 Malignant neoplasm of unspecified site of left male breast: Secondary | ICD-10-CM | POA: Insufficient documentation

## 2017-06-01 DIAGNOSIS — C9000 Multiple myeloma not having achieved remission: Secondary | ICD-10-CM | POA: Diagnosis not present

## 2017-06-01 DIAGNOSIS — C7951 Secondary malignant neoplasm of bone: Secondary | ICD-10-CM | POA: Insufficient documentation

## 2017-06-01 DIAGNOSIS — D696 Thrombocytopenia, unspecified: Secondary | ICD-10-CM

## 2017-06-01 DIAGNOSIS — Z170 Estrogen receptor positive status [ER+]: Secondary | ICD-10-CM | POA: Diagnosis not present

## 2017-06-01 DIAGNOSIS — D649 Anemia, unspecified: Secondary | ICD-10-CM | POA: Diagnosis not present

## 2017-06-01 DIAGNOSIS — Z5112 Encounter for antineoplastic immunotherapy: Secondary | ICD-10-CM | POA: Diagnosis present

## 2017-06-01 DIAGNOSIS — R21 Rash and other nonspecific skin eruption: Secondary | ICD-10-CM | POA: Diagnosis not present

## 2017-06-01 LAB — CBC WITH DIFFERENTIAL/PLATELET
BASOS ABS: 0 10*3/uL (ref 0.0–0.1)
BASOS PCT: 0 %
EOS PCT: 0 %
Eosinophils Absolute: 0 10*3/uL (ref 0.0–0.7)
HCT: 26.5 % — ABNORMAL LOW (ref 39.0–52.0)
Hemoglobin: 8.9 g/dL — ABNORMAL LOW (ref 13.0–17.0)
Lymphocytes Relative: 11 %
Lymphs Abs: 0.7 10*3/uL (ref 0.7–4.0)
MCH: 33 pg (ref 26.0–34.0)
MCHC: 33.6 g/dL (ref 30.0–36.0)
MCV: 98.1 fL (ref 78.0–100.0)
MONO ABS: 0.4 10*3/uL (ref 0.1–1.0)
Monocytes Relative: 7 %
Neutro Abs: 5.3 10*3/uL (ref 1.7–7.7)
Neutrophils Relative %: 82 %
PLATELETS: 118 10*3/uL — AB (ref 150–400)
RBC: 2.7 MIL/uL — ABNORMAL LOW (ref 4.22–5.81)
RDW: 16.9 % — ABNORMAL HIGH (ref 11.5–15.5)
WBC: 6.5 10*3/uL (ref 4.0–10.5)

## 2017-06-01 LAB — COMPREHENSIVE METABOLIC PANEL
ALBUMIN: 3.4 g/dL — AB (ref 3.5–5.0)
ALT: 19 U/L (ref 17–63)
ANION GAP: 5 (ref 5–15)
AST: 19 U/L (ref 15–41)
Alkaline Phosphatase: 19 U/L — ABNORMAL LOW (ref 38–126)
BILIRUBIN TOTAL: 0.6 mg/dL (ref 0.3–1.2)
BUN: 16 mg/dL (ref 6–20)
CO2: 25 mmol/L (ref 22–32)
Calcium: 8.3 mg/dL — ABNORMAL LOW (ref 8.9–10.3)
Chloride: 108 mmol/L (ref 101–111)
Creatinine, Ser: 1.2 mg/dL (ref 0.61–1.24)
GFR calc Af Amer: >60 mL/min (ref >60)
GFR calc non Af Amer: 60 mL/min — ABNORMAL LOW (ref >60)
GLUCOSE: 100 mg/dL — AB (ref 65–99)
POTASSIUM: 3.9 mmol/L (ref 3.5–5.1)
SODIUM: 138 mmol/L (ref 135–145)
TOTAL PROTEIN: 5.9 g/dL — AB (ref 6.5–8.1)

## 2017-06-01 MED ORDER — BORTEZOMIB CHEMO SQ INJECTION 3.5 MG (2.5MG/ML)
1.3000 mg/m2 | Freq: Once | INTRAMUSCULAR | Status: AC
  Administered 2017-06-01: 2.75 mg via SUBCUTANEOUS
  Filled 2017-06-01: qty 2.75

## 2017-06-01 MED ORDER — PROCHLORPERAZINE MALEATE 10 MG PO TABS
10.0000 mg | ORAL_TABLET | Freq: Once | ORAL | Status: AC
  Administered 2017-06-01: 10 mg via ORAL
  Filled 2017-06-01: qty 1

## 2017-06-01 NOTE — Patient Instructions (Addendum)
[facility] at [facility] Discharge Instructions  RECOMMENDATIONS MADE BY THE CONSULTANT AND ANY TEST RESULTS WILL BE SENT TO YOUR REFERRING PHYSICIAN.  You were seen today by Gretchen Dawson NP. Return as scheduled.   Thank you for choosing [facility] at [facility] to provide your oncology and hematology care.  To afford each patient quality time with our provider, please arrive at least 15 minutes before your scheduled appointment time.    If you have a lab appointment with the Cancer Center please come in thru the  Main Entrance and check in at the main information desk  You need to re-schedule your appointment should you arrive 10 or more minutes late.  We strive to give you quality time with our providers, and arriving late affects you and other patients whose appointments are after yours.  Also, if you no show three or more times for appointments you may be dismissed from the clinic at the providers discretion.     Again, thank you for choosing [facility].  Our hope is that these requests will decrease the amount of time that you wait before being seen by our physicians.       _____________________________________________________________  Should you have questions after your visit to [facility], please contact our office at [phone] between the hours of 8:30 a.m. and 4:30 p.m.  Voicemails left after 4:30 p.m. will not be returned until the following business day.  For prescription refill requests, have your pharmacy contact our office.       Resources For Cancer Patients and their Caregivers ? American Cancer Society: Can assist with transportation, wigs, general needs, runs Look Good Feel Better.        1-888-227-6333 ? Cancer Care: Provides financial assistance, online support groups, medication/co-pay assistance.  1-800-813-HOPE (4673) ? [facility] Assists [place]  cancer patients and their families through emotional , educational and financial support.  336-427-4357 ? [place] DSS Where to apply for food stamps, Medicaid and utility assistance. 336-342-1394 ? RCATS: Transportation to medical appointments. 336-347-2287 ? Social Security Administration: May apply for disability if have a Stage IV cancer. 336-342-7796 1-800-772-1213 ? [place] Aging, Disability and Transit Services: Assists with nutrition, care and transit needs. 336-349-2343  Cancer Center Support Programs: @10RELATIVEDAYS@ > Cancer Support Group  2nd Tuesday of the month 1pm-2pm, Journey Room  > Creative Journey  3rd Tuesday of the month 1130am-1pm, Journey Room  > Look Good Feel Better  1st Wednesday of the month 10am-12 noon, Journey Room (Call [employer] to register 1-800-395-5775)    

## 2017-06-01 NOTE — Progress Notes (Signed)
[place], [place] [address]   CLINIC:  Medical Oncology/Hematology  PCP:  [name], MD [place] [address] [phone]   REASON FOR VISIT:  Follow-up for Stage II multiple myeloma AND Stage IV breast cancer with bone mets   CURRENT THERAPY: Revlimid/Velcade/Dexamethasone every 21 days, beginning 03/01/17 with Delton See every 28 days AND Tamoxifen daily    BRIEF ONCOLOGIC HISTORY:    Breast cancer, male ([place])   12/31/2009 Initial Biopsy    Biopsy of L breast       12/31/2009 Pathology Results    Invasive ductal carcinoma, ER/PR+, HER 2 negative      12/31/2009 Imaging    Ultrasound showing a 2.43 x 1.85 x 3 cm hypoechoic spiculated mass in the 12 o clock L breast retroareolar region      01/01/2010 -  Anti-estrogen oral therapy    Tamoxifen 20 mg daily      01/06/2010 Imaging    Bone scan abnormal uptake in the diaphysis of the R humerus, abnormal in the R third, fifth and sixth ribs, lesion also noted in the sternum.      02/03/2010 Surgery    Rod placement and fixation of R humerus by Dr. [name]      02/05/2010 - 02/18/2010 Radiation Therapy    30Gy in 10 fractions of 3 Gy per fraction to R pathologic fracture      03/11/2010 -  Chemotherapy    Denosumab monthly, now every 3 months. Started at [facility]       06/09/2016 Imaging    Three hypermetabolic osseous lesions in the sternum, left ilium and right ilium, as discussed above, likely represent osseous metastases. At this time, these are not recognizable on the CT images. 2. No extra skeletal metastatic disease identified in the neck, chest, abdomen or pelvis.      10/13/2016 Progression    PET shows various new and enlarging osseous metastatic lesions with no definite extra osseous metastatic disease currently identified.       12/31/2016 Progression    1. Multifocal hypermetabolic osseous metastases throughout the axial and proximal appendicular skeleton, which are  increased in size, number and metabolism since 10/13/2016 PET-CT. 2. 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 3. No additional sites of hypermetabolic metastatic disease. 4. Chronic right mastoid sinusitis. 5. Aortic atherosclerosis.  One vessel coronary atherosclerosis.       Multiple myeloma ([place])   02/12/2017 Bone Marrow Biopsy    The marrow was variably cellular with large peritrabecular aggregates of kappa restricted plasma cells (66% by aspirate, 30% by Cd138). Cytogenetics +11.       03/01/2017 -  Chemotherapy    RVD         HISTORY OF PRESENT ILLNESS:  (From Dr. Laverle Patter note on 02/18/17)     INTERVAL HISTORY:  Johnny Lucas 71 y.o. male here for routine follow-up for multiple myeloma and Stage IV metastatic breast cancer.   He is here today with his wife. Overall, he tells me he has been feeling pretty well. His appetite is 75%; energy levels are 50%. Endorses continued pain to his lower back and the back of his legs at 2/10; occasionally requires Aleve "about once per week."  He is currently in cycle #5 of Revlimid/Velcade/Dexamethasone. He is tolerating his chemotherapy quite well. He was struggling with a rash, but this  improved with starting Claritin.    Remains on calcium and vitamin D supplementation. Reports taking about 1000 mg of calcium per day. His last dose of Xgeva was held d/t hypocalcemia.   He is scheduled to see Dr. Norma Fredrickson at [facility] tomorrow for initial consultation for stem cell transplant consideration. He continues to have further questions about his initial bone biopsy in 2011 which showed metastatic breast cancer, as well as his current pathology results revealing multiple myeloma. He and his wife expressed some confusion about the differences between these 2 diseases.  Remains on Tamoxifen with good  tolerance.   Otherwise, he is largely without complaints today.   REVIEW OF SYSTEMS:  Review of Systems  Constitutional: Positive for appetite change (decreased appetite) and fatigue. Negative for chills and fever.  HENT:  Negative.  Negative for lump/mass and nosebleeds.   Eyes: Negative.   Respiratory: Negative.  Negative for cough and shortness of breath.   Cardiovascular: Negative.  Negative for chest pain and leg swelling.  Gastrointestinal: Negative.  Negative for abdominal pain, blood in stool, constipation, diarrhea, nausea and vomiting.       Intermittent loose stools, but denies diarrhea.   Endocrine: Negative.   Genitourinary: Negative.  Negative for dysuria and hematuria.   Musculoskeletal: Positive for arthralgias, back pain and myalgias.  Skin: Negative.  Negative for rash.  Neurological: Negative.  Negative for dizziness and headaches.  Hematological: Negative.  Negative for adenopathy. Does not bruise/bleed easily.  Psychiatric/Behavioral: Negative.  Negative for depression and sleep disturbance. The patient is not nervous/anxious.      PAST MEDICAL/SURGICAL HISTORY:  Past Medical History:  Diagnosis Date  . Anxiety   . Bone metastases ([place]) 09/10/2016  . Breast cancer ([place]) 2011   Stave IV breast cancer; radiation and tamoxifen  . Breast cancer, male ([place])    Stave IV breast cancer; radiation and tamoxifen  Overview:  Left breast ca with mets bone  Overview:  METS TO BONE  . GERD (gastroesophageal reflux disease)   . Hypertension   . Macular degeneration   . Multiple myeloma ([place]) 02/18/2017   Past Surgical History:  Procedure Laterality Date  . BACK SURGERY    . HERNIA REPAIR    . right arm surgery       SOCIAL HISTORY:  Social History   Social History  . Marital status: Married    Spouse name: N/A  . Number of children: 3  . Years of education: N/A   Occupational History  . Not on file.   Social History Main Topics  . Smoking status: Never  Smoker  . Smokeless tobacco: Former Systems developer  . Alcohol use 14.4 oz/week    24 Cans of beer per week  . Drug use: No  . Sexual activity: Not on file   Other Topics Concern  . Not on file   Social History Narrative  . No narrative on file    FAMILY HISTORY:  Family History  Problem Relation Age of Onset  . Stroke Mother   . Cancer Maternal Aunt        cancer NOS; died in her 69s  . Lung cancer Maternal Uncle        smoker    CURRENT MEDICATIONS:  Outpatient Encounter Prescriptions as of 06/01/2017  Medication Sig Note  . acyclovir (ZOVIRAX) 400 MG tablet Take 1 tablet (400 mg total) by mouth 2 (two) times daily.   Marland Kitchen albuterol (PROVENTIL HFA;VENTOLIN HFA) 108 (90 Base)  MCG/ACT inhaler Inhale 2 puffs into the lungs every 6 (six) hours as needed for wheezing or shortness of breath.   . ALPRAZolam (XANAX) 0.5 MG tablet Take 1 tablet (0.5 mg total) by mouth at bedtime as needed for anxiety.   Marland Kitchen aspirin EC 81 MG tablet Take 162 mg by mouth daily.  09/10/2016: Received from: [facility]: Take 162 mg by mouth daily.  . bortezomib IV (VELCADE) 3.5 MG injection Inject 1.3 mg/m2 into the vein once.   . Calcium Carb-Cholecalciferol (CALCIUM 500 + D3) 500-600 MG-UNIT TABS Take 2 tablets by mouth daily.   . calcium carbonate (TUMS - DOSED IN MG ELEMENTAL CALCIUM) 500 MG chewable tablet Chew 3 tablets by mouth daily.    . calcium-vitamin D (OSCAL WITH D) 500-200 MG-UNIT TABS tablet Take 2 tablets by mouth daily.   Marland Kitchen dexamethasone (DECADRON) 4 MG tablet TAKE 10 TABLETS (40 MG) BY MOUTH ON DAYS 1, 8, AND 15 OF CHEMO. REPEAT EVERY 21 DAYS.   Marland Kitchen lenalidomide (REVLIMID) 25 MG capsule Take one capsule daily on days 1-14 every 21 days.   Marland Kitchen lidocaine-prilocaine (EMLA) cream Apply to affected area once   . loratadine (CLARITIN) 10 MG tablet Take 1 tablet (10 mg total) by mouth daily.   Marland Kitchen LORazepam (ATIVAN) 0.5 MG tablet Take 1 tablet (0.5 mg total) by mouth every 6 (six) hours as needed (Nausea  or vomiting).   . metoprolol succinate (TOPROL-XL) 100 MG 24 hr tablet Take 100 mg by mouth daily.  01/04/2017: Received from: External Pharmacy  . Multiple Vitamins-Minerals (OCUVITE PO) Take by mouth daily.  09/10/2016: Received from: [facility]: Take 1 tablet by mouth daily.  . ondansetron (ZOFRAN) 8 MG tablet Take 1 tablet (8 mg total) by mouth 2 (two) times daily as needed (Nausea or vomiting).   . pantoprazole (PROTONIX) 40 MG tablet Take 40 mg by mouth daily. 03/29/2017: Takes prn  . polyethylene glycol (MIRALAX / GLYCOLAX) packet Take 17 g by mouth daily as needed.   . prochlorperazine (COMPAZINE) 10 MG tablet Take 1 tablet (10 mg total) by mouth every 6 (six) hours as needed (Nausea or vomiting).   . sildenafil (REVATIO) 20 MG tablet TAKE UP TO FIVE TABLETS BY MOUTH EVERY DAY AS NEEDED   . tamoxifen (NOLVADEX) 20 MG tablet Take 20 mg by mouth daily.   . vitamin B-12 (CYANOCOBALAMIN) 1000 MCG tablet Take 1,000 mcg by mouth daily.  09/10/2016: Received from: [facility]: Take 1,000 mcg by mouth daily.  . [EXPIRED] bortezomib SQ (VELCADE) chemo injection 2.75 mg    . prochlorperazine (COMPAZINE) tablet 10 mg     No facility-administered encounter medications on file as of 06/01/2017.     ALLERGIES:  No Known Allergies   PHYSICAL EXAM:  ECOG Performance status: 1 - Symptomatic, but largely independent.   Vitals:   06/01/17 1342  BP: 117/69  Pulse: 67  Resp: 16   Filed Weights   06/01/17 1342  Weight: 205 lb (93 kg)    Physical Exam  Constitutional: He is oriented to person, place, and time and well-developed, well-nourished, and in no distress.  HENT:  Head: Normocephalic.  Mouth/Throat: Oropharynx is clear and moist. No oropharyngeal exudate.  Eyes: Conjunctivae are normal. Pupils are equal, round, and reactive to light. No scleral icterus.  Neck: Normal range of motion. Neck supple.  Cardiovascular: Normal rate and regular rhythm.     Pulmonary/Chest: Effort normal and breath sounds normal. No respiratory  distress. He has no wheezes. He has no rales.  Abdominal: Soft. Bowel sounds are normal. There is no tenderness. There is no rebound and no guarding.  Musculoskeletal: Normal range of motion. He exhibits no edema.  Lymphadenopathy:    He has no cervical adenopathy.       Right: No supraclavicular adenopathy present.       Left: No supraclavicular adenopathy present.  Neurological: He is alert and oriented to person, place, and time. No cranial nerve deficit. Gait normal.  Skin: Skin is warm and dry.  Skin very dry. No visible rash  Psychiatric: Mood, memory, affect and judgment normal.  Nursing note and vitals reviewed.    LABORATORY DATA:  I have reviewed the labs as listed.  CBC    Component Value Date/Time   WBC 6.5 06/01/2017 1240   RBC 2.70 (L) 06/01/2017 1240   HGB 8.9 (L) 06/01/2017 1240   HCT 26.5 (L) 06/01/2017 1240   PLT 118 (L) 06/01/2017 1240   MCV 98.1 06/01/2017 1240   MCH 33.0 06/01/2017 1240   MCHC 33.6 06/01/2017 1240   RDW 16.9 (H) 06/01/2017 1240   LYMPHSABS 0.7 06/01/2017 1240   MONOABS 0.4 06/01/2017 1240   EOSABS 0.0 06/01/2017 1240   BASOSABS 0.0 06/01/2017 1240   CMP Latest Ref Rng & Units 06/01/2017 05/25/2017 05/17/2017  Glucose 65 - 99 mg/dL 100(H) 119(H) 132(H)  BUN 6 - 20 mg/dL 16 20 13   Creatinine 0.61 - 1.24 mg/dL 1.20 1.20 1.14  Sodium 135 - 145 mmol/L 138 138 137  Potassium 3.5 - 5.1 mmol/L 3.9 4.4 3.7  Chloride 101 - 111 mmol/L 108 107 107  CO2 22 - 32 mmol/L 25 24 26   Calcium 8.9 - 10.3 mg/dL 8.3(L) 8.8(L) 8.5(L)  Total Protein 6.5 - 8.1 g/dL 5.9(L) 6.3(L) 6.3(L)  Total Bilirubin 0.3 - 1.2 mg/dL 0.6 0.3 0.3  Alkaline Phos 38 - 126 U/L 19(L) 22(L) 24(L)  AST 15 - 41 U/L 19 17 25   ALT 17 - 63 U/L 19 16(L) 32    PENDING LABS:    DIAGNOSTIC IMAGING:  *Images and radiologic reports reviewed independently and agree with finds as listed below.  PET scan:  12/31/16      Mammogram 04/06/17       PATHOLOGY:  (R) humerus bone path: 01/21/10 (re-evaluated on 04/09/17)     Genetics report: 01/22/14    Bone marrow biopsy: 02/02/17   Cytogenetics report: 02/10/17       ASSESSMENT & PLAN:   IgG kappa multiple myeloma:  -SPEP in 12/2016 revealed M-spike 2.3% with elevated IgG monoclonal protein (3,255 mg/dL) and elevated kappa light chains (2,129.1 mg/L) & kappa/lambda light chain ratio (272.96).  -Bone marrow biopsy on 02/02/17 demonstrated multiple myeloma; evidence of large peritravecular aggregates with kappa restricted plasma cells (66% by aspirate, 30% by Cd138). Cytogenetics +11.   -Started therapy with Revlimid/Velcade/Decadron ([facility]) on 03/01/17. He has reportedly been compliant with oral chemotherapy. Labs reviewed and are adequate for Velcade injection today as scheduled.  -M-spike, kappa light chain, and kappa/lambda light chain ratio has been decreasing, as expected, suggesting good response to therapy thus far.   -Discussed the role that stem cell transplant plays in the management of multiple myeloma.  He has consultation scheduled with Dr. Norma Fredrickson at [facility] tomorrow, 06/02/17.  -We reviewed his previous PET scan imaging from 12/2016 as requested by the patient and his wife. They remain with quite a bit of confusion regarding the difference between breast cancer  with bone mets and multiple myeloma, which also affects the bones.  We spent quite a bit of time today discussing the difference.   -Return to cancer center as scheduled for Xgeva injection next week; then return for follow-up visit in 3-4 weeks for continued follow-up. Will order repeat myeloma labs for follow-up visit in 3-4 weeks as well.   Anemia/Mild thrombocytopenia:  -Likely secondary to treatment. Platelet count 118,000 today. No need for dose-reduction in treatment regimen; we will continue to monitor.  -Anemia largely stable; hemoglobin 8.9 today. No intervention  needed at this time.   Bone mets:  -Previous scheduled dose was held d/t hypocalcemia (see below).  -Continue monthly Xgeva to help prevent skeletal related events.   Hypocalcemia:  -Currently taking 1000 mg calcium per day. Recommended he increase to 2000 mg daily.  -I will also add on serum vitamin D level to be checked next week, as vitamin D deficiency could be contributing to his hypocalcemia.   Viral prophylaxis:  -Continue Acyclovir.   Mild rash noted to trunk/low back:  -Present prior to Revlimid/Velcade. Clinically, does not appear to be a drug rash, but more likely secondary to dry skin. Recommended he apply OTC lotions/ointments with vitamin E liberally.    Male left breast cancer:  -Mammogram in 03/2017 with reduced size of initial mass in left breast (originally 3 cm, now 1.1 cm). Since his last visit with me, I had our pathology team re-review his (R) humerus bone pathology from 2011. Staining was done which was ER+; there was not enough tissue to do HER2. However, ER+ staining supports metastatic breast cancer. -Genetics report dated 01/11/14: Variant of Unknown Significance, "[id number]."  -Reviewed with him that while metastatic breast cancer is not curable, it remains treatable.  Currently, his multiple myeloma is the more pressing issue in terms of treatment.  -Continue Tamoxifen.     Dispo:  -Return in 3-4 weeks for continued follow-up.   All questions were answered to patient's stated satisfaction. Encouraged patient to call with any new concerns or questions before his next visit to the cancer center and we can certain see him sooner, if needed.    A total of 30 minutes was spent in face-to-face care of this patient, with greater than 50% of that time spent in counseling and care-coordination.    Orders placed this encounter:  Orders Placed This Encounter  Procedures  . VITAMIN D 25 Hydroxy (Vit-D Deficiency, Fractures)  . CBC with Differential/Platelet  .  Comprehensive metabolic panel  . IgG, IgA, IgM  . Kappa/lambda light chains  . Protein electrophoresis, serum      Mike Craze, NP [place] [phone]

## 2017-06-01 NOTE — Progress Notes (Deleted)
[address], [place] [address]   CLINIC:  Medical Oncology/Hematology  PCP:  [name], MD [place] [address] [phone]   REASON FOR VISIT:  Follow-up for ***  CURRENT THERAPY: ***  BRIEF ONCOLOGIC HISTORY:    Breast cancer, male ([place])   12/31/2009 Initial Biopsy    Biopsy of L breast       12/31/2009 Pathology Results    Invasive ductal carcinoma, ER/PR+, HER 2 negative      12/31/2009 Imaging    Ultrasound showing a 2.43 x 1.85 x 3 cm hypoechoic spiculated mass in the 12 o clock L breast retroareolar region      01/01/2010 -  Anti-estrogen oral therapy    Tamoxifen 20 mg daily      01/06/2010 Imaging    Bone scan abnormal uptake in the diaphysis of the R humerus, abnormal in the R third, fifth and sixth ribs, lesion also noted in the sternum.      02/03/2010 Surgery    Rod placement and fixation of R humerus by Dr. [name]      02/05/2010 - 02/18/2010 Radiation Therapy    30Gy in 10 fractions of 3 Gy per fraction to R pathologic fracture      03/11/2010 -  Chemotherapy    Denosumab monthly, now every 3 months. Started at [facility]       06/09/2016 Imaging    Three hypermetabolic osseous lesions in the sternum, left ilium and right ilium, as discussed above, likely represent osseous metastases. At this time, these are not recognizable on the CT images. 2. No extra skeletal metastatic disease identified in the neck, chest, abdomen or pelvis.      10/13/2016 Progression    PET shows various new and enlarging osseous metastatic lesions with no definite extra osseous metastatic disease currently identified.       12/31/2016 Progression    1. Multifocal hypermetabolic osseous metastases throughout the axial and proximal appendicular skeleton, which are increased in size, number and metabolism since 10/13/2016 PET-CT. 2. 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 3. No additional sites of hypermetabolic metastatic disease. 4. Chronic right mastoid sinusitis. 5. Aortic atherosclerosis.  One vessel coronary atherosclerosis.       Multiple myeloma ([place])   02/12/2017 Bone Marrow Biopsy    The marrow was variably cellular with large peritrabecular aggregates of kappa restricted plasma cells (66% by aspirate, 30% by Cd138). Cytogenetics +11.       03/01/2017 -  Chemotherapy    RVD         HISTORY OF PRESENT ILLNESS:     INTERVAL HISTORY:    REVIEW OF SYSTEMS:  Review of Systems - Oncology   PAST MEDICAL/SURGICAL HISTORY:  Past Medical History:  Diagnosis Date  . Anxiety   . Bone metastases ([place]) 09/10/2016  . Breast cancer ([place]) 2011   Stave IV breast cancer; radiation and tamoxifen  . Breast cancer, male ([place])    Stave IV breast cancer; radiation and tamoxifen  Overview:  Left breast ca with mets bone  Overview:  METS TO BONE  . GERD (gastroesophageal reflux disease)   . Hypertension   . Macular degeneration   . Multiple myeloma ([place]) 02/18/2017   Past Surgical History:  Procedure Laterality Date  . BACK SURGERY    . HERNIA REPAIR    . right  arm surgery       SOCIAL HISTORY:  Social History   Social History  . Marital status: Married    Spouse name: N/A  . Number of children: 3  . Years of education: N/A   Occupational History  . Not on file.   Social History Main Topics  . Smoking status: Never Smoker  . Smokeless tobacco: Former Systems developer  . Alcohol use 14.4 oz/week    24 Cans of beer per week  . Drug use: No  . Sexual activity: Not on file   Other Topics Concern  . Not on file   Social History Narrative  . No narrative on file    FAMILY HISTORY:  Family History  Problem Relation Age of Onset  . Stroke Mother   . Cancer Maternal Aunt        cancer NOS; died in her 34s  . Lung cancer Maternal Uncle        smoker    CURRENT  MEDICATIONS:  Outpatient Encounter Prescriptions as of 06/01/2017  Medication Sig Note  . acyclovir (ZOVIRAX) 400 MG tablet Take 1 tablet (400 mg total) by mouth 2 (two) times daily.   Marland Kitchen albuterol (PROVENTIL HFA;VENTOLIN HFA) 108 (90 Base) MCG/ACT inhaler Inhale 2 puffs into the lungs every 6 (six) hours as needed for wheezing or shortness of breath.   . ALPRAZolam (XANAX) 0.5 MG tablet Take 1 tablet (0.5 mg total) by mouth at bedtime as needed for anxiety.   Marland Kitchen aspirin EC 81 MG tablet Take 162 mg by mouth daily.  09/10/2016: Received from: [facility]: Take 162 mg by mouth daily.  . bortezomib IV (VELCADE) 3.5 MG injection Inject 1.3 mg/m2 into the vein once.   . Calcium Carb-Cholecalciferol (CALCIUM 500 + D3) 500-600 MG-UNIT TABS Take 2 tablets by mouth daily.   . calcium carbonate (TUMS - DOSED IN MG ELEMENTAL CALCIUM) 500 MG chewable tablet Chew 3 tablets by mouth daily.    . calcium-vitamin D (OSCAL WITH D) 500-200 MG-UNIT TABS tablet Take 2 tablets by mouth daily.   Marland Kitchen dexamethasone (DECADRON) 4 MG tablet TAKE 10 TABLETS (40 MG) BY MOUTH ON DAYS 1, 8, AND 15 OF CHEMO. REPEAT EVERY 21 DAYS.   Marland Kitchen lenalidomide (REVLIMID) 25 MG capsule Take one capsule daily on days 1-14 every 21 days.   Marland Kitchen lidocaine-prilocaine (EMLA) cream Apply to affected area once   . loratadine (CLARITIN) 10 MG tablet Take 1 tablet (10 mg total) by mouth daily.   Marland Kitchen LORazepam (ATIVAN) 0.5 MG tablet Take 1 tablet (0.5 mg total) by mouth every 6 (six) hours as needed (Nausea or vomiting).   . metoprolol succinate (TOPROL-XL) 100 MG 24 hr tablet Take 100 mg by mouth daily.  01/04/2017: Received from: External Pharmacy  . Multiple Vitamins-Minerals (OCUVITE PO) Take by mouth daily.  09/10/2016: Received from: [place]: Take 1 tablet by mouth daily.  . ondansetron (ZOFRAN) 8 MG tablet Take 1 tablet (8 mg total) by mouth 2 (two) times daily as needed (Nausea or vomiting).   . pantoprazole (PROTONIX) 40 MG  tablet Take 40 mg by mouth daily. 03/29/2017: Takes prn  . polyethylene glycol (MIRALAX / GLYCOLAX) packet Take 17 g by mouth daily as needed.   . prochlorperazine (COMPAZINE) 10 MG tablet Take 1 tablet (10 mg total) by mouth every 6 (six) hours as needed (Nausea or vomiting).   . sildenafil (REVATIO) 20 MG tablet TAKE UP TO FIVE TABLETS BY MOUTH EVERY  DAY AS NEEDED   . tamoxifen (NOLVADEX) 20 MG tablet Take 20 mg by mouth daily.   . vitamin B-12 (CYANOCOBALAMIN) 1000 MCG tablet Take 1,000 mcg by mouth daily.  09/10/2016: Received from: [facility]: Take 1,000 mcg by mouth daily.  . [EXPIRED] bortezomib SQ (VELCADE) chemo injection 2.75 mg    . prochlorperazine (COMPAZINE) tablet 10 mg     No facility-administered encounter medications on file as of 06/01/2017.     ALLERGIES:  No Known Allergies   PHYSICAL EXAM:  ECOG Performance status: ***  Vitals:   06/01/17 1342  BP: 117/69  Pulse: 67  Resp: 16   Filed Weights   06/01/17 1342  Weight: 205 lb (93 kg)    Physical Exam   LABORATORY DATA:  I have reviewed the labs as listed.  CBC    Component Value Date/Time   WBC 6.5 06/01/2017 1240   RBC 2.70 (L) 06/01/2017 1240   HGB 8.9 (L) 06/01/2017 1240   HCT 26.5 (L) 06/01/2017 1240   PLT 118 (L) 06/01/2017 1240   MCV 98.1 06/01/2017 1240   MCH 33.0 06/01/2017 1240   MCHC 33.6 06/01/2017 1240   RDW 16.9 (H) 06/01/2017 1240   LYMPHSABS 0.7 06/01/2017 1240   MONOABS 0.4 06/01/2017 1240   EOSABS 0.0 06/01/2017 1240   BASOSABS 0.0 06/01/2017 1240   CMP Latest Ref Rng & Units 06/01/2017 05/25/2017 05/17/2017  Glucose 65 - 99 mg/dL 100(H) 119(H) 132(H)  BUN 6 - 20 mg/dL '16 20 13  '$ Creatinine 0.61 - 1.24 mg/dL 1.20 1.20 1.14  Sodium 135 - 145 mmol/L 138 138 137  Potassium 3.5 - 5.1 mmol/L 3.9 4.4 3.7  Chloride 101 - 111 mmol/L 108 107 107  CO2 22 - 32 mmol/L '25 24 26  '$ Calcium 8.9 - 10.3 mg/dL 8.3(L) 8.8(L) 8.5(L)  Total Protein 6.5 - 8.1 g/dL 5.9(L) 6.3(L) 6.3(L)    Total Bilirubin 0.3 - 1.2 mg/dL 0.6 0.3 0.3  Alkaline Phos 38 - 126 U/L 19(L) 22(L) 24(L)  AST 15 - 41 U/L '19 17 25  '$ ALT 17 - 63 U/L 19 16(L) 32    PENDING LABS:    DIAGNOSTIC IMAGING:  *The following radiologic images and reports have been reviewed independently and agree with below findings.  ***  PATHOLOGY:  ***   ASSESSMENT & PLAN:         Dispo:  -    All questions were answered to patient's stated satisfaction. Encouraged patient to call with any new concerns or questions before his next visit to the cancer center and we can certain see him sooner, if needed.    Plan of care discussed with Dr. Talbert Cage, who agrees with the above aforementioned.    Orders placed this encounter:  Orders Placed This Encounter  Procedures  . VITAMIN D 25 Hydroxy (Vit-D Deficiency, Fractures)      Mike Craze, NP [place] [phone]

## 2017-06-01 NOTE — Progress Notes (Signed)
Johnny Lucas presents today for injection per the provider's orders.  Velcade administration without incident; see MAR for injection details.  Patient tolerated procedure well and without incident.  No questions or complaints noted at this time.  Discharged ambulatory.

## 2017-06-02 DIAGNOSIS — C9000 Multiple myeloma not having achieved remission: Secondary | ICD-10-CM | POA: Diagnosis not present

## 2017-06-08 ENCOUNTER — Encounter (HOSPITAL_BASED_OUTPATIENT_CLINIC_OR_DEPARTMENT_OTHER): Payer: Medicare Other

## 2017-06-08 ENCOUNTER — Encounter (HOSPITAL_COMMUNITY): Payer: Medicare Other

## 2017-06-08 ENCOUNTER — Encounter (HOSPITAL_COMMUNITY): Payer: Self-pay

## 2017-06-08 VITALS — BP 125/66 | HR 66 | Temp 97.7°F | Resp 18 | Wt 207.2 lb

## 2017-06-08 DIAGNOSIS — C7951 Secondary malignant neoplasm of bone: Secondary | ICD-10-CM | POA: Diagnosis not present

## 2017-06-08 DIAGNOSIS — C9000 Multiple myeloma not having achieved remission: Secondary | ICD-10-CM | POA: Diagnosis not present

## 2017-06-08 DIAGNOSIS — Z5112 Encounter for antineoplastic immunotherapy: Secondary | ICD-10-CM | POA: Diagnosis present

## 2017-06-08 DIAGNOSIS — Z170 Estrogen receptor positive status [ER+]: Secondary | ICD-10-CM | POA: Diagnosis not present

## 2017-06-08 DIAGNOSIS — C50922 Malignant neoplasm of unspecified site of left male breast: Secondary | ICD-10-CM | POA: Diagnosis not present

## 2017-06-08 LAB — COMPREHENSIVE METABOLIC PANEL
ALK PHOS: 20 U/L — AB (ref 38–126)
ALT: 30 U/L (ref 17–63)
ANION GAP: 7 (ref 5–15)
AST: 35 U/L (ref 15–41)
Albumin: 3.5 g/dL (ref 3.5–5.0)
BILIRUBIN TOTAL: 0.9 mg/dL (ref 0.3–1.2)
BUN: 18 mg/dL (ref 6–20)
CALCIUM: 8.8 mg/dL — AB (ref 8.9–10.3)
CO2: 25 mmol/L (ref 22–32)
CREATININE: 1.17 mg/dL (ref 0.61–1.24)
Chloride: 103 mmol/L (ref 101–111)
Glucose, Bld: 104 mg/dL — ABNORMAL HIGH (ref 65–99)
Potassium: 4.3 mmol/L (ref 3.5–5.1)
Sodium: 135 mmol/L (ref 135–145)
TOTAL PROTEIN: 6 g/dL — AB (ref 6.5–8.1)

## 2017-06-08 LAB — CBC WITH DIFFERENTIAL/PLATELET
Basophils Absolute: 0 10*3/uL (ref 0.0–0.1)
Basophils Relative: 0 %
EOS ABS: 0 10*3/uL (ref 0.0–0.7)
Eosinophils Relative: 0 %
HCT: 26.8 % — ABNORMAL LOW (ref 39.0–52.0)
HEMOGLOBIN: 8.8 g/dL — AB (ref 13.0–17.0)
LYMPHS ABS: 0.6 10*3/uL — AB (ref 0.7–4.0)
LYMPHS PCT: 8 %
MCH: 33 pg (ref 26.0–34.0)
MCHC: 32.8 g/dL (ref 30.0–36.0)
MCV: 100.4 fL — AB (ref 78.0–100.0)
MONOS PCT: 13 %
Monocytes Absolute: 1 10*3/uL (ref 0.1–1.0)
NEUTROS PCT: 79 %
Neutro Abs: 6.1 10*3/uL (ref 1.7–7.7)
Platelets: 87 10*3/uL — ABNORMAL LOW (ref 150–400)
RBC: 2.67 MIL/uL — AB (ref 4.22–5.81)
RDW: 15.6 % — ABNORMAL HIGH (ref 11.5–15.5)
WBC: 7.7 10*3/uL (ref 4.0–10.5)

## 2017-06-08 MED ORDER — BORTEZOMIB CHEMO SQ INJECTION 3.5 MG (2.5MG/ML)
1.3000 mg/m2 | Freq: Once | INTRAMUSCULAR | Status: AC
  Administered 2017-06-08: 2.75 mg via SUBCUTANEOUS
  Filled 2017-06-08: qty 2.75

## 2017-06-08 MED ORDER — PROCHLORPERAZINE MALEATE 10 MG PO TABS
ORAL_TABLET | ORAL | Status: AC
  Filled 2017-06-08: qty 1

## 2017-06-08 MED ORDER — PROCHLORPERAZINE MALEATE 10 MG PO TABS
10.0000 mg | ORAL_TABLET | Freq: Once | ORAL | Status: AC
  Administered 2017-06-08: 10 mg via ORAL

## 2017-06-08 NOTE — Progress Notes (Signed)
Labs reviewed by MD, proceed with treatment  Johnny Lucas presents today for injection per MD orders. Velcade 2.75mg  administered SQ in left Abdomen. Administration without incident. Patient tolerated well. Vitals stable and discharged home from clinic ambulatory. Follow up as scheduled.  xgeva scheduled for tomorrow.

## 2017-06-09 ENCOUNTER — Encounter (HOSPITAL_BASED_OUTPATIENT_CLINIC_OR_DEPARTMENT_OTHER): Payer: Medicare Other

## 2017-06-09 ENCOUNTER — Other Ambulatory Visit (HOSPITAL_COMMUNITY): Payer: Self-pay

## 2017-06-09 VITALS — BP 115/58 | HR 67 | Temp 98.5°F | Resp 18

## 2017-06-09 DIAGNOSIS — C9000 Multiple myeloma not having achieved remission: Secondary | ICD-10-CM

## 2017-06-09 DIAGNOSIS — C7951 Secondary malignant neoplasm of bone: Secondary | ICD-10-CM

## 2017-06-09 DIAGNOSIS — C50122 Malignant neoplasm of central portion of left male breast: Secondary | ICD-10-CM | POA: Diagnosis not present

## 2017-06-09 MED ORDER — DENOSUMAB 120 MG/1.7ML ~~LOC~~ SOLN
120.0000 mg | Freq: Once | SUBCUTANEOUS | Status: AC
  Administered 2017-06-09: 120 mg via SUBCUTANEOUS
  Filled 2017-06-09: qty 1.7

## 2017-06-09 MED ORDER — TAMOXIFEN CITRATE 20 MG PO TABS: 20.0000 mg | ORAL_TABLET | Freq: Every day | ORAL | 1 refills | Status: DC

## 2017-06-09 NOTE — Telephone Encounter (Signed)
Patient stated he needed someone to call [facility] mail order pharmacy because he could not get his tamoxifen. Called pharmacy and spoke with pharmacy tech who said he prescription had no more refills. She states they had sent the refill request to Dr. Jacquiline Doe. Explained that patient does not see him anymore and had moved his care to [facility].  Reviewed with provider, chart checked and prescription e-scribed to [facility] mail order in [place] , [place].

## 2017-06-09 NOTE — Progress Notes (Signed)
Johnny Lucas presents today for injection per MD orders. xgeva 120mg  administered SQ in left Upper Arm. Administration without incident. Patient tolerated well. Vitals stable and discharged home from clinic ambulatory. Follow up as scheduled.

## 2017-06-09 NOTE — Patient Instructions (Signed)
[facility] Discharge Instructions  RECOMMENDATIONS MADE BY THE CONSULTANT AND ANY TEST RESULTS WILL BE SENT TO YOUR REFERRING PHYSICIAN.  xgeva given  Follow up as scheduled.  Thank you for choosing [facility] at [facility] to provide your oncology and hematology care.  To afford each patient quality time with our provider, please arrive at least 15 minutes before your scheduled appointment time.    If you have a lab appointment with the Cancer Center please come in thru the  Main Entrance and check in at the main information desk  You need to re-schedule your appointment should you arrive 10 or more minutes late.  We strive to give you quality time with our providers, and arriving late affects you and other patients whose appointments are after yours.  Also, if you no show three or more times for appointments you may be dismissed from the clinic at the providers discretion.     Again, thank you for choosing [facility].  Our hope is that these requests will decrease the amount of time that you wait before being seen by our physicians.       _____________________________________________________________  Should you have questions after your visit to [facility], please contact our office at [phone] between the hours of 8:30 a.m. and 4:30 p.m.  Voicemails left after 4:30 p.m. will not be returned until the following business day.  For prescription refill requests, have your pharmacy contact our office.       Resources For Cancer Patients and their Caregivers ? American Cancer Society: Can assist with transportation, wigs, general needs, runs Look Good Feel Better.        1-888-227-6333 ? Cancer Care: Provides financial assistance, online support groups, medication/co-pay assistance.  1-800-813-HOPE (4673) ? [facility] Assists [place] cancer patients and their  families through emotional , educational and financial support.  336-427-4357 ? [place] DSS Where to apply for food stamps, Medicaid and utility assistance. 336-342-1394 ? [facility]: Transportation to medical appointments. 336-347-2287 ? Social Security Administration: May apply for disability if have a Stage IV cancer. 336-342-7796 1-800-772-1213 ? [place] Aging, Disability and Transit Services: Assists with nutrition, care and transit needs. 336-349-2343  Cancer Center Support Programs: @[email or web address]@ > Cancer Support Group  2nd Tuesday of the month 1pm-2pm, Journey Room  > Creative Journey  3rd Tuesday of the month 1130am-1pm, Journey Room  > Look Good Feel Better  1st Wednesday of the month 10am-12 noon, Journey Room (Call American Cancer Society to register 1-800-395-5775)   

## 2017-06-14 ENCOUNTER — Other Ambulatory Visit (HOSPITAL_COMMUNITY): Payer: Self-pay | Admitting: Emergency Medicine

## 2017-06-14 DIAGNOSIS — C9000 Multiple myeloma not having achieved remission: Secondary | ICD-10-CM

## 2017-06-14 MED ORDER — LENALIDOMIDE 25 MG PO CAPS: ORAL_CAPSULE | ORAL | 0 refills | Status: DC

## 2017-06-14 NOTE — Progress Notes (Signed)
revlimid refilled

## 2017-06-15 ENCOUNTER — Encounter (HOSPITAL_BASED_OUTPATIENT_CLINIC_OR_DEPARTMENT_OTHER): Payer: Medicare Other

## 2017-06-15 ENCOUNTER — Encounter (HOSPITAL_COMMUNITY): Payer: Medicare Other

## 2017-06-15 ENCOUNTER — Other Ambulatory Visit (HOSPITAL_COMMUNITY): Payer: Self-pay

## 2017-06-15 VITALS — BP 121/64 | HR 67 | Temp 98.6°F | Resp 18 | Wt 206.0 lb

## 2017-06-15 DIAGNOSIS — Z5112 Encounter for antineoplastic immunotherapy: Secondary | ICD-10-CM

## 2017-06-15 DIAGNOSIS — Z170 Estrogen receptor positive status [ER+]: Secondary | ICD-10-CM | POA: Diagnosis not present

## 2017-06-15 DIAGNOSIS — C9000 Multiple myeloma not having achieved remission: Secondary | ICD-10-CM

## 2017-06-15 DIAGNOSIS — C7951 Secondary malignant neoplasm of bone: Secondary | ICD-10-CM | POA: Diagnosis not present

## 2017-06-15 DIAGNOSIS — C50922 Malignant neoplasm of unspecified site of left male breast: Secondary | ICD-10-CM

## 2017-06-15 LAB — COMPREHENSIVE METABOLIC PANEL
ALBUMIN: 3.6 g/dL (ref 3.5–5.0)
ALK PHOS: 19 U/L — AB (ref 38–126)
ALT: 31 U/L (ref 17–63)
AST: 21 U/L (ref 15–41)
Anion gap: 6 (ref 5–15)
BILIRUBIN TOTAL: 0.5 mg/dL (ref 0.3–1.2)
BUN: 20 mg/dL (ref 6–20)
CALCIUM: 8.4 mg/dL — AB (ref 8.9–10.3)
CO2: 24 mmol/L (ref 22–32)
CREATININE: 1.12 mg/dL (ref 0.61–1.24)
Chloride: 106 mmol/L (ref 101–111)
GFR calc Af Amer: >60 mL/min (ref >60)
GFR calc non Af Amer: >60 mL/min (ref >60)
GLUCOSE: 122 mg/dL — AB (ref 65–99)
POTASSIUM: 4.2 mmol/L (ref 3.5–5.1)
Sodium: 136 mmol/L (ref 135–145)
TOTAL PROTEIN: 5.9 g/dL — AB (ref 6.5–8.1)

## 2017-06-15 LAB — CBC WITH DIFFERENTIAL/PLATELET
BASOS ABS: 0 10*3/uL (ref 0.0–0.1)
Basophils Relative: 0 %
Eosinophils Absolute: 0 10*3/uL (ref 0.0–0.7)
Eosinophils Relative: 0 %
HEMATOCRIT: 26.9 % — AB (ref 39.0–52.0)
Hemoglobin: 9.2 g/dL — ABNORMAL LOW (ref 13.0–17.0)
LYMPHS PCT: 16 %
Lymphs Abs: 0.8 10*3/uL (ref 0.7–4.0)
MCH: 33.6 pg (ref 26.0–34.0)
MCHC: 34.2 g/dL (ref 30.0–36.0)
MCV: 98.2 fL (ref 78.0–100.0)
MONO ABS: 1.2 10*3/uL — AB (ref 0.1–1.0)
MONOS PCT: 22 %
NEUTROS ABS: 3.4 10*3/uL (ref 1.7–7.7)
Neutrophils Relative %: 63 %
Platelets: 82 10*3/uL — ABNORMAL LOW (ref 150–400)
RBC: 2.74 MIL/uL — ABNORMAL LOW (ref 4.22–5.81)
RDW: 16.1 % — AB (ref 11.5–15.5)
WBC: 5.3 10*3/uL (ref 4.0–10.5)

## 2017-06-15 MED ORDER — BORTEZOMIB CHEMO SQ INJECTION 3.5 MG (2.5MG/ML)
1.3000 mg/m2 | Freq: Once | INTRAMUSCULAR | Status: AC
  Administered 2017-06-15: 2.75 mg via SUBCUTANEOUS
  Filled 2017-06-15: qty 2.75

## 2017-06-15 MED ORDER — ACYCLOVIR 400 MG PO TABS: 400.0000 mg | ORAL_TABLET | Freq: Two times a day (BID) | ORAL | 3 refills | Status: DC

## 2017-06-15 MED ORDER — PROCHLORPERAZINE MALEATE 10 MG PO TABS
10.0000 mg | ORAL_TABLET | Freq: Once | ORAL | Status: AC
  Administered 2017-06-15: 10 mg via ORAL
  Filled 2017-06-15: qty 1

## 2017-06-15 NOTE — Telephone Encounter (Signed)
Received refill request from patients pharmacy for Acyclovir. Reviewed with provider, chart checked and refilled.

## 2017-06-15 NOTE — Treatment Plan (Signed)
Ok to treat today with Velcade per Dr Talbert Cage 82k platelets today

## 2017-06-15 NOTE — Progress Notes (Signed)
Johnny Lucas presents today for injection per MD orders. velcade 2.75 mg administered SQ in right Abdomen. Administration without incident. Patient tolerated well.  vitals stable and discharged home from clinic ambulatory, follow up as scheduled.

## 2017-06-15 NOTE — Patient Instructions (Signed)
[facility] Discharge Instructions for Patients Receiving Chemotherapy   Beginning January 23rd 2017 lab work for the [facility] will be done in the  Main lab at [facility] on 1st floor. If you have a lab appointment with the [facility] please come in thru the  Main Entrance and check in at the main information desk   Today you received the following chemotherapy agents velcade injection.  Return as scheduled.   If you develop nausea and vomiting, or diarrhea that is not controlled by your medication, call the clinic.  The clinic phone number is [phone]. Office hours are Monday-Friday 8:30am-5:00pm.  BELOW ARE SYMPTOMS THAT SHOULD BE REPORTED IMMEDIATELY:  *FEVER GREATER THAN 101.0 F  *CHILLS WITH OR WITHOUT FEVER  NAUSEA AND VOMITING THAT IS NOT CONTROLLED WITH YOUR NAUSEA MEDICATION  *UNUSUAL SHORTNESS OF BREATH  *UNUSUAL BRUISING OR BLEEDING  TENDERNESS IN MOUTH AND THROAT WITH OR WITHOUT PRESENCE OF ULCERS  *URINARY PROBLEMS  *BOWEL PROBLEMS  UNUSUAL RASH Items with * indicate a potential emergency and should be followed up as soon as possible. If you have an emergency after office hours please contact your primary care physician or go to the nearest emergency department.  Please call the clinic during office hours if you have any questions or concerns.   You may also contact the Patient Navigator at [phone] should you have any questions or need assistance in obtaining follow up care.      Resources For Cancer Patients and their Caregivers ? American Cancer Society: Can assist with transportation, wigs, general needs, runs Look Good Feel Better.        (828)244-8391 ? Cancer Care: Provides financial assistance, online support groups, medication/co-pay assistance.  1-800-813-HOPE 586-716-0545) ? [facility] Assists [place] cancer patients and their families through emotional , educational and financial  support.  (248)151-0369 ? [place] DSS Where to apply for food stamps, Medicaid and utility assistance. 647 520 3199 ? [facility]: Transportation to medical appointments. (403)321-9729 ? Social Security Administration: May apply for disability if have a Stage IV cancer. 364-189-0132 9393132661 ? [employer], Disability and Transit Services: Assists with nutrition, care and transit needs. 339-069-0676

## 2017-06-16 LAB — VITAMIN D 25 HYDROXY (VIT D DEFICIENCY, FRACTURES): Vit D, 25-Hydroxy: 31.4 ng/mL (ref 30.0–100.0)

## 2017-06-22 ENCOUNTER — Encounter (HOSPITAL_COMMUNITY): Payer: Self-pay

## 2017-06-22 ENCOUNTER — Encounter (HOSPITAL_BASED_OUTPATIENT_CLINIC_OR_DEPARTMENT_OTHER): Payer: Medicare Other

## 2017-06-22 ENCOUNTER — Encounter (HOSPITAL_COMMUNITY): Payer: Medicare Other

## 2017-06-22 ENCOUNTER — Encounter (HOSPITAL_BASED_OUTPATIENT_CLINIC_OR_DEPARTMENT_OTHER): Payer: Medicare Other | Admitting: Oncology

## 2017-06-22 VITALS — BP 140/61 | HR 61 | Temp 97.7°F | Resp 18 | Wt 204.2 lb

## 2017-06-22 DIAGNOSIS — Z170 Estrogen receptor positive status [ER+]: Secondary | ICD-10-CM | POA: Diagnosis not present

## 2017-06-22 DIAGNOSIS — C50122 Malignant neoplasm of central portion of left male breast: Secondary | ICD-10-CM

## 2017-06-22 DIAGNOSIS — C9000 Multiple myeloma not having achieved remission: Secondary | ICD-10-CM

## 2017-06-22 DIAGNOSIS — D696 Thrombocytopenia, unspecified: Secondary | ICD-10-CM

## 2017-06-22 DIAGNOSIS — Z5112 Encounter for antineoplastic immunotherapy: Secondary | ICD-10-CM | POA: Diagnosis present

## 2017-06-22 DIAGNOSIS — D649 Anemia, unspecified: Secondary | ICD-10-CM | POA: Diagnosis not present

## 2017-06-22 DIAGNOSIS — C7951 Secondary malignant neoplasm of bone: Secondary | ICD-10-CM | POA: Diagnosis not present

## 2017-06-22 DIAGNOSIS — C50922 Malignant neoplasm of unspecified site of left male breast: Secondary | ICD-10-CM | POA: Diagnosis not present

## 2017-06-22 DIAGNOSIS — C50021 Malignant neoplasm of nipple and areola, right male breast: Secondary | ICD-10-CM

## 2017-06-22 LAB — COMPREHENSIVE METABOLIC PANEL
ALK PHOS: 19 U/L — AB (ref 38–126)
ALT: 17 U/L (ref 17–63)
AST: 18 U/L (ref 15–41)
Albumin: 3.6 g/dL (ref 3.5–5.0)
Anion gap: 6 (ref 5–15)
BUN: 20 mg/dL (ref 6–20)
CO2: 26 mmol/L (ref 22–32)
CREATININE: 1.19 mg/dL (ref 0.61–1.24)
Calcium: 9.1 mg/dL (ref 8.9–10.3)
Chloride: 103 mmol/L (ref 101–111)
GFR calc non Af Amer: >60 mL/min (ref >60)
Glucose, Bld: 107 mg/dL — ABNORMAL HIGH (ref 65–99)
Potassium: 4.1 mmol/L (ref 3.5–5.1)
SODIUM: 135 mmol/L (ref 135–145)
Total Bilirubin: 0.6 mg/dL (ref 0.3–1.2)
Total Protein: 6.1 g/dL — ABNORMAL LOW (ref 6.5–8.1)

## 2017-06-22 LAB — CBC WITH DIFFERENTIAL/PLATELET
Basophils Absolute: 0 10*3/uL (ref 0.0–0.1)
Basophils Relative: 0 %
EOS ABS: 0 10*3/uL (ref 0.0–0.7)
Eosinophils Relative: 0 %
HCT: 27.4 % — ABNORMAL LOW (ref 39.0–52.0)
HEMOGLOBIN: 9.2 g/dL — AB (ref 13.0–17.0)
LYMPHS ABS: 0.6 10*3/uL — AB (ref 0.7–4.0)
LYMPHS PCT: 12 %
MCH: 33.1 pg (ref 26.0–34.0)
MCHC: 33.6 g/dL (ref 30.0–36.0)
MCV: 98.6 fL (ref 78.0–100.0)
Monocytes Absolute: 0.3 10*3/uL (ref 0.1–1.0)
Monocytes Relative: 7 %
Neutro Abs: 4.1 10*3/uL (ref 1.7–7.7)
Neutrophils Relative %: 82 %
Platelets: 118 10*3/uL — ABNORMAL LOW (ref 150–400)
RBC: 2.78 MIL/uL — AB (ref 4.22–5.81)
RDW: 16.1 % — ABNORMAL HIGH (ref 11.5–15.5)
WBC: 5 10*3/uL (ref 4.0–10.5)

## 2017-06-22 MED ORDER — PROCHLORPERAZINE MALEATE 10 MG PO TABS
10.0000 mg | ORAL_TABLET | Freq: Once | ORAL | Status: AC
  Administered 2017-06-22: 10 mg via ORAL

## 2017-06-22 MED ORDER — PROCHLORPERAZINE MALEATE 10 MG PO TABS
ORAL_TABLET | ORAL | Status: AC
  Filled 2017-06-22: qty 1

## 2017-06-22 MED ORDER — FERROUS SULFATE 325 (65 FE) MG PO TBEC: 325.0000 mg | DELAYED_RELEASE_TABLET | Freq: Two times a day (BID) | ORAL | 3 refills | Status: DC

## 2017-06-22 MED ORDER — BORTEZOMIB CHEMO SQ INJECTION 3.5 MG (2.5MG/ML)
1.3000 mg/m2 | Freq: Once | INTRAMUSCULAR | Status: AC
  Administered 2017-06-22: 2.75 mg via SUBCUTANEOUS
  Filled 2017-06-22: qty 2.75

## 2017-06-22 NOTE — Progress Notes (Signed)
[place],  [id number]   CLINIC:  Medical Oncology/Hematology  PCP:  [name], MD [place] [address] [phone]   REASON FOR VISIT:  Follow-up for Stage II multiple myeloma AND Stage IV breast cancer with bone mets   CURRENT THERAPY: Revlimid/Velcade/Dexamethasone every 21 days, beginning 03/01/17 with Delton See every 28 days AND Tamoxifen daily    BRIEF ONCOLOGIC HISTORY:    Breast cancer, male ([place])   12/31/2009 Initial Biopsy    Biopsy of L breast       12/31/2009 Pathology Results    Invasive ductal carcinoma, ER/PR+, HER 2 negative      12/31/2009 Imaging    Ultrasound showing a 2.43 x 1.85 x 3 cm hypoechoic spiculated mass in the 12 o clock L breast retroareolar region      01/01/2010 -  Anti-estrogen oral therapy    Tamoxifen 20 mg daily      01/06/2010 Imaging    Bone scan abnormal uptake in the diaphysis of the R humerus, abnormal in the R third, fifth and sixth ribs, lesion also noted in the sternum.      02/03/2010 Surgery    Rod placement and fixation of R humerus by Dr. [name]      02/05/2010 - 02/18/2010 Radiation Therapy    30Gy in 10 fractions of 3 Gy per fraction to R pathologic fracture      03/11/2010 -  Chemotherapy    Denosumab monthly, now every 3 months. Started at [facility]       06/09/2016 Imaging    Three hypermetabolic osseous lesions in the sternum, left ilium and right ilium, as discussed above, likely represent osseous metastases. At this time, these are not recognizable on the CT images. 2. No extra skeletal metastatic disease identified in the neck, chest, abdomen or pelvis.      10/13/2016 Progression    PET shows various new and enlarging osseous metastatic lesions with no definite extra osseous metastatic disease currently identified.       12/31/2016 Progression    1. Multifocal hypermetabolic osseous metastases throughout the axial and proximal appendicular skeleton, which are  increased in size, number and metabolism since 10/13/2016 PET-CT. 2. 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 3. No additional sites of hypermetabolic metastatic disease. 4. Chronic right mastoid sinusitis. 5. Aortic atherosclerosis.  One vessel coronary atherosclerosis.       Multiple myeloma ([place])   02/12/2017 Bone Marrow Biopsy    The marrow was variably cellular with large peritrabecular aggregates of kappa restricted plasma cells (66% by aspirate, 30% by Cd138). Cytogenetics +11.       03/01/2017 -  Chemotherapy    RVD       05/26/2017 Bone Marrow Biopsy    Performed at [facility]:  Plasma cell myeloma in a 30% cellularmarrow with decreased trilineage hematopoiesis and 42% kappalight chain restricted plasma cells on the aspirate smears andlarge aggregates on the core biopsy.         HISTORY OF PRESENT ILLNESS:       INTERVAL HISTORY:  Johnny Lucas 71 y.o. male here for routine follow-up for multiple myeloma and Stage IV metastatic breast cancer.   He is here today for follow-up. He went see Dr. Norma Fredrickson at [facility] on 06/02/17 for evaluation for transplant for his multiple myeloma. At the time of  his evaluation his M spike was 0.6, serum free light chain ratio of 117.69 consistent with partial response. He was encouraged by Dr. Norma Fredrickson to increase his aerobic exercise. Patient states that he has been exercising more and he feels like his energy level has improved. Overall he states that he has not felt this well in a very long time. He denies any bone pain. Denies any chest pain, shortness breath, abdominal pain, focal weakness.   REVIEW OF SYSTEMS:  Review of Systems  Constitutional: Negative for appetite change, chills, fatigue and fever.  HENT:  Negative.  Negative for lump/mass and nosebleeds.   Eyes: Negative.   Respiratory:  Negative.  Negative for cough and shortness of breath.   Cardiovascular: Negative.  Negative for chest pain and leg swelling.  Gastrointestinal: Negative.  Negative for abdominal pain, blood in stool, constipation, diarrhea, nausea and vomiting.       Intermittent loose stools, but denies diarrhea.   Endocrine: Negative.   Genitourinary: Negative.  Negative for dysuria and hematuria.   Musculoskeletal: Negative for arthralgias, back pain and myalgias.  Skin: Negative.  Negative for rash.  Neurological: Negative.  Negative for dizziness and headaches.  Hematological: Negative.  Negative for adenopathy. Does not bruise/bleed easily.  Psychiatric/Behavioral: Negative.  Negative for depression and sleep disturbance. The patient is not nervous/anxious.      PAST MEDICAL/SURGICAL HISTORY:  Past Medical History:  Diagnosis Date  . Anxiety   . Bone metastases ([place]) 09/10/2016  . Breast cancer ([place]) 2011   Stave IV breast cancer; radiation and tamoxifen  . Breast cancer, male ([place])    Stave IV breast cancer; radiation and tamoxifen  Overview:  Left breast ca with mets bone  Overview:  METS TO BONE  . GERD (gastroesophageal reflux disease)   . Hypertension   . Macular degeneration   . Multiple myeloma ([place]) 02/18/2017   Past Surgical History:  Procedure Laterality Date  . BACK SURGERY    . HERNIA REPAIR    . right arm surgery       SOCIAL HISTORY:  Social History   Social History  . Marital status: Married    Spouse name: N/A  . Number of children: 3  . Years of education: N/A   Occupational History  . Not on file.   Social History Main Topics  . Smoking status: Never Smoker  . Smokeless tobacco: Former Systems developer  . Alcohol use 14.4 oz/week    24 Cans of beer per week  . Drug use: No  . Sexual activity: Not on file   Other Topics Concern  . Not on file   Social History Narrative  . No narrative on file    FAMILY HISTORY:  Family History  Problem Relation Age of Onset    . Stroke Mother   . Cancer Maternal Aunt        cancer NOS; died in her 12s  . Lung cancer Maternal Uncle        smoker    CURRENT MEDICATIONS:  Outpatient Encounter Prescriptions as of 06/22/2017  Medication Sig Note  . acyclovir (ZOVIRAX) 400 MG tablet Take 1 tablet (400 mg total) by mouth 2 (two) times daily.   Marland Kitchen albuterol (PROVENTIL HFA;VENTOLIN HFA) 108 (90 Base) MCG/ACT inhaler Inhale 2 puffs into the lungs every 6 (six) hours as needed for wheezing or shortness of breath.   . ALPRAZolam (XANAX) 0.5 MG tablet Take 1 tablet (0.5 mg total) by mouth at bedtime as needed for anxiety.   Marland Kitchen  aspirin EC 81 MG tablet Take 162 mg by mouth daily.  09/10/2016: Received from: [facility]: Take 162 mg by mouth daily.  . bortezomib IV (VELCADE) 3.5 MG injection Inject 1.3 mg/m2 into the vein once.   . Calcium Carb-Cholecalciferol (CALCIUM 500 + D3) 500-600 MG-UNIT TABS Take 2 tablets by mouth daily.   . calcium carbonate (TUMS - DOSED IN MG ELEMENTAL CALCIUM) 500 MG chewable tablet Chew 3 tablets by mouth daily.    . calcium-vitamin D (OSCAL WITH D) 500-200 MG-UNIT TABS tablet Take 2 tablets by mouth daily.   Marland Kitchen dexamethasone (DECADRON) 4 MG tablet TAKE 10 TABLETS (40 MG) BY MOUTH ON DAYS 1, 8, AND 15 OF CHEMO. REPEAT EVERY 21 DAYS.   Marland Kitchen lenalidomide (REVLIMID) 25 MG capsule Take one capsule daily on days 1-14 every 21 days.   Marland Kitchen lidocaine-prilocaine (EMLA) cream Apply to affected area once   . loratadine (CLARITIN) 10 MG tablet Take 1 tablet (10 mg total) by mouth daily.   Marland Kitchen LORazepam (ATIVAN) 0.5 MG tablet Take 1 tablet (0.5 mg total) by mouth every 6 (six) hours as needed (Nausea or vomiting).   . metoprolol succinate (TOPROL-XL) 100 MG 24 hr tablet Take 100 mg by mouth daily.  01/04/2017: Received from: External Pharmacy  . Multiple Vitamins-Minerals (OCUVITE PO) Take by mouth daily.  09/10/2016: Received from: [facility]: Take 1 tablet by mouth daily.  . ondansetron  (ZOFRAN) 8 MG tablet Take 1 tablet (8 mg total) by mouth 2 (two) times daily as needed (Nausea or vomiting).   . pantoprazole (PROTONIX) 40 MG tablet Take 40 mg by mouth daily. 03/29/2017: Takes prn  . polyethylene glycol (MIRALAX / GLYCOLAX) packet Take 17 g by mouth daily as needed.   . prochlorperazine (COMPAZINE) 10 MG tablet Take 1 tablet (10 mg total) by mouth every 6 (six) hours as needed (Nausea or vomiting).   . sildenafil (REVATIO) 20 MG tablet TAKE UP TO FIVE TABLETS BY MOUTH EVERY DAY AS NEEDED   . tamoxifen (NOLVADEX) 20 MG tablet Take 1 tablet (20 mg total) by mouth daily.   . vitamin B-12 (CYANOCOBALAMIN) 1000 MCG tablet Take 1,000 mcg by mouth daily.  09/10/2016: Received from: [facility]: Take 1,000 mcg by mouth daily.   No facility-administered encounter medications on file as of 06/22/2017.     ALLERGIES:  No Known Allergies   PHYSICAL EXAM:  ECOG Performance status: 1 - Symptomatic, but largely independent.   There were no vitals filed for this visit. There were no vitals filed for this visit.  Physical Exam  Constitutional: He is oriented to person, place, and time and well-developed, well-nourished, and in no distress. No distress.  HENT:  Head: Normocephalic.  Mouth/Throat: Oropharynx is clear and moist. No oropharyngeal exudate.  Eyes: Conjunctivae are normal. Pupils are equal, round, and reactive to light. No scleral icterus.  Neck: Normal range of motion. Neck supple.  Cardiovascular: Normal rate and regular rhythm.   No murmur heard. Pulmonary/Chest: Effort normal and breath sounds normal. No respiratory distress. He has no wheezes. He has no rales.  Abdominal: Soft. Bowel sounds are normal. There is no tenderness. There is no rebound and no guarding.  Musculoskeletal: Normal range of motion. He exhibits no edema.  Lymphadenopathy:    He has no cervical adenopathy.       Right: No supraclavicular adenopathy present.       Left: No  supraclavicular adenopathy present.  Neurological: He is  alert and oriented to person, place, and time. No cranial nerve deficit. Gait normal.  Skin: Skin is warm and dry.  Psychiatric: Mood, memory, affect and judgment normal.  Nursing note and vitals reviewed.    LABORATORY DATA:  I have reviewed the labs as listed.  CBC    Component Value Date/Time   WBC 5.0 06/22/2017 1301   RBC 2.78 (L) 06/22/2017 1301   HGB 9.2 (L) 06/22/2017 1301   HCT 27.4 (L) 06/22/2017 1301   PLT 118 (L) 06/22/2017 1301   MCV 98.6 06/22/2017 1301   MCH 33.1 06/22/2017 1301   MCHC 33.6 06/22/2017 1301   RDW 16.1 (H) 06/22/2017 1301   LYMPHSABS 0.6 (L) 06/22/2017 1301   MONOABS 0.3 06/22/2017 1301   EOSABS 0.0 06/22/2017 1301   BASOSABS 0.0 06/22/2017 1301   CMP Latest Ref Rng & Units 06/15/2017 06/08/2017 06/01/2017  Glucose 65 - 99 mg/dL 122(H) 104(H) 100(H)  BUN 6 - 20 mg/dL '20 18 16  '$ Creatinine 0.61 - 1.24 mg/dL 1.12 1.17 1.20  Sodium 135 - 145 mmol/L 136 135 138  Potassium 3.5 - 5.1 mmol/L 4.2 4.3 3.9  Chloride 101 - 111 mmol/L 106 103 108  CO2 22 - 32 mmol/L '24 25 25  '$ Calcium 8.9 - 10.3 mg/dL 8.4(L) 8.8(L) 8.3(L)  Total Protein 6.5 - 8.1 g/dL 5.9(L) 6.0(L) 5.9(L)  Total Bilirubin 0.3 - 1.2 mg/dL 0.5 0.9 0.6  Alkaline Phos 38 - 126 U/L 19(L) 20(L) 19(L)  AST 15 - 41 U/L 21 35 19  ALT 17 - 63 U/L '31 30 19    '$ PENDING LABS:    DIAGNOSTIC IMAGING:  *Images and radiologic reports reviewed independently and agree with finds as listed below.  PET scan: 12/31/16      Mammogram 04/06/17       PATHOLOGY:  (R) humerus bone path: 01/21/10 (re-evaluated on 04/09/17)     Genetics report: 01/22/14    Bone marrow biopsy: 02/02/17   Cytogenetics report: 02/10/17       ASSESSMENT & PLAN:   IgG kappa multiple myeloma:  -SPEP in 12/2016 revealed M-spike 2.3% with elevated IgG monoclonal protein (3,255 mg/dL) and elevated kappa light chains (2,129.1 mg/L) & kappa/lambda light chain ratio  (272.96).  -Bone marrow biopsy on 02/02/17 demonstrated multiple myeloma; evidence of large peritravecular aggregates with kappa restricted plasma cells (66% by aspirate, 30% by Cd138). Cytogenetics +11.   -Started therapy with Revlimid/Velcade/Decadron (RVD) on 03/01/17. To date he has completed up to cycle 6 date 8 with a partial response. -He has seen Dr. Norma Fredrickson for bone marrow transplant evaluation. Dr. Norma Fredrickson recommended changing his regimen to carfilzomib, cyclophosphamide, dexamethasone in an attempt to get a deeper response. A bone marrow biopsy after 2 additional cycles (if his light chain ratio improves) is recommended to assess cellularity. At that point a PET scan would be indicated to assess for FDG avid lesions and further plan of action. A DEXA scan is recommended to assess for osteoporosis. He will be seeing Dr. Norma Fredrickson again in late August to further discuss transplant.  Anemia/Mild thrombocytopenia:  -Likely secondary to treatment.  -He had iron studies performed at [facility] as well which demonstrated had low iron saturation and serum iron. Therefore I have started patient on ferrous sulfate 325 mg by mouth twice a day with meals. We'll continue to monitor his iron levels. I recommended for him to take a stool softener should he get constipated due to the iron tablets.  Bone mets:  -Continue  monthly Xgeva to help prevent skeletal related events.  -I will order a DEXA scan on his next visit to assess for osteoporosis.  Hypocalcemia:  -Currently taking 2000 mg calcium with vitamin D.   Viral prophylaxis:  -Continue Acyclovir.   Male left breast cancer:  -Mammogram in 03/2017 with reduced size of initial mass in left breast (originally 3 cm, now 1.1 cm). Since his last visit with me, I had our pathology team re-review his (R) humerus bone pathology from 2011. Staining was done which was ER+; there was not enough tissue to do HER2. However, ER+ staining supports metastatic  breast cancer. -Genetics report dated 01/11/14: Variant of Unknown Significance, "[id number]."  -Reviewed with him that while metastatic breast cancer is not curable, it remains treatable.  Currently, his multiple myeloma is the more pressing issue in terms of treatment.  -Continue Tamoxifen.     Dispo:  -Return on 06/28/17 for cycle 6 day 15 of treatment and also for follow up visit to discuss changing his treatment plan.  All questions were answered to patient's stated satisfaction. Encouraged patient to call with any new concerns or questions before his next visit to the cancer center and we can certain see him sooner, if needed.    Twana First, MD

## 2017-06-22 NOTE — Progress Notes (Signed)
Velcade injection given as ordered. Patient stable and ambulatory on discharge home to self.

## 2017-06-22 NOTE — Patient Instructions (Signed)
[facility] Discharge Instructions for Patients Receiving Chemotherapy   Beginning January 23rd 2017 lab work for the [facility] will be done in the  Main lab at [facility] on 1st floor. If you have a lab appointment with the [facility] please come in thru the  Main Entrance and check in at the main information desk   Today you received the following chemotherapy agents velcade.  If you develop nausea and vomiting, or diarrhea that is not controlled by your medication, call the clinic.  The clinic phone number is [phone]. Office hours are Monday-Friday 8:30am-5:00pm.  BELOW ARE SYMPTOMS THAT SHOULD BE REPORTED IMMEDIATELY:  *FEVER GREATER THAN 101.0 F  *CHILLS WITH OR WITHOUT FEVER  NAUSEA AND VOMITING THAT IS NOT CONTROLLED WITH YOUR NAUSEA MEDICATION  *UNUSUAL SHORTNESS OF BREATH  *UNUSUAL BRUISING OR BLEEDING  TENDERNESS IN MOUTH AND THROAT WITH OR WITHOUT PRESENCE OF ULCERS  *URINARY PROBLEMS  *BOWEL PROBLEMS  UNUSUAL RASH Items with * indicate a potential emergency and should be followed up as soon as possible. If you have an emergency after office hours please contact your primary care physician or go to the nearest emergency department.  Please call the clinic during office hours if you have any questions or concerns.   You may also contact the Patient Navigator at [phone] should you have any questions or need assistance in obtaining follow up care.      Resources For Cancer Patients and their Caregivers ? American Cancer Society: Can assist with transportation, wigs, general needs, runs Look Good Feel Better.        551-021-3882 ? Cancer Care: Provides financial assistance, online support groups, medication/co-pay assistance.  1-800-813-HOPE 505-637-1539) ? [place] Assists [place] cancer patients and their families through emotional , educational and financial support.   (807) 422-6483 ? [place] DSS Where to apply for food stamps, Medicaid and utility assistance. (978) 790-1161 ? [facility]: Transportation to medical appointments. 985-021-2786 ? Social Security Administration: May apply for disability if have a Stage IV cancer. 716-694-1681 (845) 372-2101 ? [employer], Disability and Transit Services: Assists with nutrition, care and transit needs. 951-514-0579

## 2017-06-29 ENCOUNTER — Encounter (HOSPITAL_COMMUNITY): Payer: Medicare Other | Attending: Oncology

## 2017-06-29 ENCOUNTER — Encounter (HOSPITAL_COMMUNITY): Payer: Self-pay

## 2017-06-29 ENCOUNTER — Encounter (HOSPITAL_BASED_OUTPATIENT_CLINIC_OR_DEPARTMENT_OTHER): Payer: Medicare Other | Admitting: Oncology

## 2017-06-29 ENCOUNTER — Encounter (HOSPITAL_COMMUNITY): Payer: Medicare Other

## 2017-06-29 VITALS — BP 121/64 | HR 72 | Resp 18 | Ht 68.0 in | Wt 204.4 lb

## 2017-06-29 DIAGNOSIS — C7951 Secondary malignant neoplasm of bone: Secondary | ICD-10-CM | POA: Diagnosis not present

## 2017-06-29 DIAGNOSIS — I10 Essential (primary) hypertension: Secondary | ICD-10-CM | POA: Diagnosis not present

## 2017-06-29 DIAGNOSIS — Z79810 Long term (current) use of selective estrogen receptor modulators (SERMs): Secondary | ICD-10-CM | POA: Insufficient documentation

## 2017-06-29 DIAGNOSIS — C50922 Malignant neoplasm of unspecified site of left male breast: Secondary | ICD-10-CM | POA: Insufficient documentation

## 2017-06-29 DIAGNOSIS — Z9221 Personal history of antineoplastic chemotherapy: Secondary | ICD-10-CM | POA: Diagnosis not present

## 2017-06-29 DIAGNOSIS — Z5112 Encounter for antineoplastic immunotherapy: Secondary | ICD-10-CM

## 2017-06-29 DIAGNOSIS — D509 Iron deficiency anemia, unspecified: Secondary | ICD-10-CM | POA: Diagnosis not present

## 2017-06-29 DIAGNOSIS — Z7982 Long term (current) use of aspirin: Secondary | ICD-10-CM | POA: Insufficient documentation

## 2017-06-29 DIAGNOSIS — Z923 Personal history of irradiation: Secondary | ICD-10-CM | POA: Insufficient documentation

## 2017-06-29 DIAGNOSIS — K219 Gastro-esophageal reflux disease without esophagitis: Secondary | ICD-10-CM | POA: Diagnosis not present

## 2017-06-29 DIAGNOSIS — C9000 Multiple myeloma not having achieved remission: Secondary | ICD-10-CM

## 2017-06-29 DIAGNOSIS — D696 Thrombocytopenia, unspecified: Secondary | ICD-10-CM | POA: Insufficient documentation

## 2017-06-29 DIAGNOSIS — D649 Anemia, unspecified: Secondary | ICD-10-CM | POA: Diagnosis not present

## 2017-06-29 DIAGNOSIS — Z8673 Personal history of transient ischemic attack (TIA), and cerebral infarction without residual deficits: Secondary | ICD-10-CM | POA: Diagnosis not present

## 2017-06-29 DIAGNOSIS — H3530 Unspecified macular degeneration: Secondary | ICD-10-CM | POA: Insufficient documentation

## 2017-06-29 DIAGNOSIS — Z79899 Other long term (current) drug therapy: Secondary | ICD-10-CM | POA: Insufficient documentation

## 2017-06-29 DIAGNOSIS — Z170 Estrogen receptor positive status [ER+]: Secondary | ICD-10-CM | POA: Insufficient documentation

## 2017-06-29 LAB — CBC WITH DIFFERENTIAL/PLATELET
Basophils Absolute: 0 10*3/uL (ref 0.0–0.1)
Basophils Relative: 0 %
Eosinophils Absolute: 0 10*3/uL (ref 0.0–0.7)
Eosinophils Relative: 0 %
HCT: 27.3 % — ABNORMAL LOW (ref 39.0–52.0)
HEMOGLOBIN: 9.4 g/dL — AB (ref 13.0–17.0)
LYMPHS ABS: 0.4 10*3/uL — AB (ref 0.7–4.0)
LYMPHS PCT: 12 %
MCH: 33.7 pg (ref 26.0–34.0)
MCHC: 34.4 g/dL (ref 30.0–36.0)
MCV: 97.8 fL (ref 78.0–100.0)
MONOS PCT: 18 %
Monocytes Absolute: 0.6 10*3/uL (ref 0.1–1.0)
NEUTROS PCT: 70 %
Neutro Abs: 2.5 10*3/uL (ref 1.7–7.7)
Platelets: 111 10*3/uL — ABNORMAL LOW (ref 150–400)
RBC: 2.79 MIL/uL — AB (ref 4.22–5.81)
RDW: 15.7 % — ABNORMAL HIGH (ref 11.5–15.5)
WBC: 3.5 10*3/uL — AB (ref 4.0–10.5)

## 2017-06-29 LAB — COMPREHENSIVE METABOLIC PANEL
ALK PHOS: 22 U/L — AB (ref 38–126)
ALT: 18 U/L (ref 17–63)
ANION GAP: 9 (ref 5–15)
AST: 20 U/L (ref 15–41)
Albumin: 3.5 g/dL (ref 3.5–5.0)
BILIRUBIN TOTAL: 0.6 mg/dL (ref 0.3–1.2)
BUN: 16 mg/dL (ref 6–20)
CALCIUM: 9.3 mg/dL (ref 8.9–10.3)
CO2: 23 mmol/L (ref 22–32)
CREATININE: 1.17 mg/dL (ref 0.61–1.24)
Chloride: 103 mmol/L (ref 101–111)
GFR calc Af Amer: >60 mL/min (ref >60)
Glucose, Bld: 133 mg/dL — ABNORMAL HIGH (ref 65–99)
Potassium: 3.9 mmol/L (ref 3.5–5.1)
Sodium: 135 mmol/L (ref 135–145)
TOTAL PROTEIN: 5.9 g/dL — AB (ref 6.5–8.1)

## 2017-06-29 MED ORDER — PROCHLORPERAZINE MALEATE 10 MG PO TABS
10.0000 mg | ORAL_TABLET | Freq: Once | ORAL | Status: AC
  Administered 2017-06-29: 10 mg via ORAL
  Filled 2017-06-29: qty 1

## 2017-06-29 MED ORDER — LIDOCAINE-PRILOCAINE 2.5-2.5 % EX CREA: TOPICAL_CREAM | CUTANEOUS | 3 refills | Status: DC

## 2017-06-29 MED ORDER — BORTEZOMIB CHEMO SQ INJECTION 3.5 MG (2.5MG/ML)
1.3000 mg/m2 | Freq: Once | INTRAMUSCULAR | Status: AC
Start: 2017-06-29 — End: 2017-06-29
  Administered 2017-06-29: 2.75 mg via SUBCUTANEOUS
  Filled 2017-06-29: qty 2.75

## 2017-06-29 MED ORDER — DEXAMETHASONE 4 MG PO TABS: ORAL_TABLET | ORAL | 4 refills | Status: DC

## 2017-06-29 MED ORDER — ACYCLOVIR 400 MG PO TABS: 400.0000 mg | ORAL_TABLET | Freq: Every day | ORAL | 3 refills | Status: DC

## 2017-06-29 NOTE — Progress Notes (Signed)
[address] [place], [place] [address]   CLINIC:  Medical Oncology/Hematology  PCP:  [name], MD [place] [address] [phone]   REASON FOR VISIT:  Follow-up for Stage II multiple myeloma AND Stage IV breast cancer with bone mets   CURRENT THERAPY: Revlimid/Velcade/Dexamethasone every 21 days, beginning 03/01/17 with Delton See every 28 days AND Tamoxifen daily    BRIEF ONCOLOGIC HISTORY:    Breast cancer, male ([place])   12/31/2009 Initial Biopsy    Biopsy of L breast       12/31/2009 Pathology Results    Invasive ductal carcinoma, ER/PR+, HER 2 negative      12/31/2009 Imaging    Ultrasound showing a 2.43 x 1.85 x 3 cm hypoechoic spiculated mass in the 12 o clock L breast retroareolar region      01/01/2010 -  Anti-estrogen oral therapy    Tamoxifen 20 mg daily      01/06/2010 Imaging    Bone scan abnormal uptake in the diaphysis of the R humerus, abnormal in the R third, fifth and sixth ribs, lesion also noted in the sternum.      02/03/2010 Surgery    Rod placement and fixation of R humerus by Dr. [name]      02/05/2010 - 02/18/2010 Radiation Therapy    30Gy in 10 fractions of 3 Gy per fraction to R pathologic fracture      03/11/2010 -  Chemotherapy    Denosumab monthly, now every 3 months. Started at [facility]       06/09/2016 Imaging    Three hypermetabolic osseous lesions in the sternum, left ilium and right ilium, as discussed above, likely represent osseous metastases. At this time, these are not recognizable on the CT images. 2. No extra skeletal metastatic disease identified in the neck, chest, abdomen or pelvis.      10/13/2016 Progression    PET shows various new and enlarging osseous metastatic lesions with no definite extra osseous metastatic disease currently identified.       12/31/2016 Progression    1. Multifocal hypermetabolic osseous metastases throughout the axial and proximal appendicular skeleton, which are  increased in size, number and metabolism since 10/13/2016 PET-CT. 2. 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 3. No additional sites of hypermetabolic metastatic disease. 4. Chronic right mastoid sinusitis. 5. Aortic atherosclerosis.  One vessel coronary atherosclerosis.       Multiple myeloma ([place])   02/12/2017 Bone Marrow Biopsy    The marrow was variably cellular with large peritrabecular aggregates of kappa restricted plasma cells (66% by aspirate, 30% by Cd138). Cytogenetics +11.       03/01/2017 -  Chemotherapy    RVD       05/26/2017 Bone Marrow Biopsy    Performed at [facility]:  Plasma cell myeloma in a 30% cellularmarrow with decreased trilineage hematopoiesis and 42% kappalight chain restricted plasma cells on the aspirate smears andlarge aggregates on the core biopsy.         HISTORY OF PRESENT ILLNESS:       INTERVAL HISTORY:  [name] 71 y.o. male here for routine follow-up for multiple myeloma and Stage IV metastatic breast cancer.   He is here today for follow-up to discuss change in therapy for his multiple myeloma.  Patient is here for cycle 6 day 15 of his velcade treatment.  He  has no new complaints since his last visit. He denies any bone pain. Denies any chest pain, shortness breath, abdominal pain, focal weakness.   REVIEW OF SYSTEMS:  Review of Systems  Constitutional: Negative for appetite change, chills, fatigue and fever.  HENT:  Negative.  Negative for lump/mass and nosebleeds.   Eyes: Negative.   Respiratory: Negative.  Negative for cough and shortness of breath.   Cardiovascular: Negative.  Negative for chest pain and leg swelling.  Gastrointestinal: Negative.  Negative for abdominal pain, blood in stool, constipation, diarrhea, nausea and vomiting.       Intermittent loose stools, but denies diarrhea.     Endocrine: Negative.   Genitourinary: Negative.  Negative for dysuria and hematuria.   Musculoskeletal: Negative for arthralgias, back pain and myalgias.  Skin: Negative.  Negative for rash.  Neurological: Negative.  Negative for dizziness and headaches.  Hematological: Negative.  Negative for adenopathy. Does not bruise/bleed easily.  Psychiatric/Behavioral: Negative.  Negative for depression and sleep disturbance. The patient is not nervous/anxious.      PAST MEDICAL/SURGICAL HISTORY:  Past Medical History:  Diagnosis Date  . Anxiety   . Bone metastases ([place]) 09/10/2016  . Breast cancer ([place]) 2011   Stave IV breast cancer; radiation and tamoxifen  . Breast cancer, male ([place])    Stave IV breast cancer; radiation and tamoxifen  Overview:  Left breast ca with mets bone  Overview:  METS TO BONE  . GERD (gastroesophageal reflux disease)   . Hypertension   . Macular degeneration   . Multiple myeloma ([place]) 02/18/2017   Past Surgical History:  Procedure Laterality Date  . BACK SURGERY    . HERNIA REPAIR    . right arm surgery       SOCIAL HISTORY:  Social History   Social History  . Marital status: Married    Spouse name: N/A  . Number of children: 3  . Years of education: N/A   Occupational History  . Not on file.   Social History Main Topics  . Smoking status: Never Smoker  . Smokeless tobacco: Former Systems developer  . Alcohol use 14.4 oz/week    24 Cans of beer per week  . Drug use: No  . Sexual activity: Not on file   Other Topics Concern  . Not on file   Social History Narrative  . No narrative on file    FAMILY HISTORY:  Family History  Problem Relation Age of Onset  . Stroke Mother   . Cancer Maternal Aunt        cancer NOS; died in her 24s  . Lung cancer Maternal Uncle        smoker    CURRENT MEDICATIONS:  Outpatient Encounter Prescriptions as of 06/29/2017  Medication Sig Note  . acyclovir (ZOVIRAX) 400 MG tablet Take 1 tablet (400 mg total) by mouth 2  (two) times daily.   Marland Kitchen albuterol (PROVENTIL HFA;VENTOLIN HFA) 108 (90 Base) MCG/ACT inhaler Inhale 2 puffs into the lungs every 6 (six) hours as needed for wheezing or shortness of breath.   . ALPRAZolam (XANAX) 0.5 MG tablet Take 1 tablet (0.5 mg total) by mouth at bedtime as needed for anxiety.   Marland Kitchen aspirin EC 81 MG tablet Take 162 mg by mouth daily.  09/10/2016: Received from: [place]: Take 162 mg by mouth daily.  . bortezomib IV (VELCADE) 3.5 MG injection Inject 1.3 mg/m2 into the vein once.   . Calcium Carb-Cholecalciferol (CALCIUM 500 + D3) 500-600  MG-UNIT TABS Take 2 tablets by mouth daily.   . calcium carbonate (TUMS - DOSED IN MG ELEMENTAL CALCIUM) 500 MG chewable tablet Chew 3 tablets by mouth daily.    . calcium-vitamin D (OSCAL WITH D) 500-200 MG-UNIT TABS tablet Take 2 tablets by mouth daily.   Marland Kitchen dexamethasone (DECADRON) 4 MG tablet TAKE 10 TABLETS (40 MG) BY MOUTH ON DAYS 1, 8, AND 15 OF CHEMO. REPEAT EVERY 21 DAYS.   . ferrous sulfate 325 (65 FE) MG EC tablet Take 1 tablet (325 mg total) by mouth 2 (two) times daily after a meal.   . lenalidomide (REVLIMID) 25 MG capsule Take one capsule daily on days 1-14 every 21 days.   Marland Kitchen lidocaine-prilocaine (EMLA) cream Apply to affected area once   . loratadine (CLARITIN) 10 MG tablet Take 1 tablet (10 mg total) by mouth daily.   Marland Kitchen LORazepam (ATIVAN) 0.5 MG tablet Take 1 tablet (0.5 mg total) by mouth every 6 (six) hours as needed (Nausea or vomiting).   . metoprolol succinate (TOPROL-XL) 100 MG 24 hr tablet Take 100 mg by mouth daily.  01/04/2017: Received from: External Pharmacy  . Multiple Vitamins-Minerals (OCUVITE PO) Take by mouth daily.  09/10/2016: Received from: [place]: Take 1 tablet by mouth daily.  . ondansetron (ZOFRAN) 8 MG tablet Take 1 tablet (8 mg total) by mouth 2 (two) times daily as needed (Nausea or vomiting).   . pantoprazole (PROTONIX) 40 MG tablet Take 40 mg by mouth daily. 03/29/2017: Takes  prn  . polyethylene glycol (MIRALAX / GLYCOLAX) packet Take 17 g by mouth daily as needed.   . prochlorperazine (COMPAZINE) 10 MG tablet Take 1 tablet (10 mg total) by mouth every 6 (six) hours as needed (Nausea or vomiting).   . sildenafil (REVATIO) 20 MG tablet TAKE UP TO FIVE TABLETS BY MOUTH EVERY DAY AS NEEDED   . tamoxifen (NOLVADEX) 20 MG tablet Take 1 tablet (20 mg total) by mouth daily.   . vitamin B-12 (CYANOCOBALAMIN) 1000 MCG tablet Take 1,000 mcg by mouth daily.  09/10/2016: Received from: [place]: Take 1,000 mcg by mouth daily.   No facility-administered encounter medications on file as of 06/29/2017.     ALLERGIES:  No Known Allergies   PHYSICAL EXAM:  ECOG Performance status: 1 - Symptomatic, but largely independent.   Vitals:   06/29/17 1300  BP: 121/64  Pulse: 72  Resp: 18   Filed Weights   06/29/17 1300  Weight: 204 lb 6.4 oz (92.7 kg)    Physical Exam  Constitutional: He is oriented to person, place, and time and well-developed, well-nourished, and in no distress. No distress.  HENT:  Head: Normocephalic.  Mouth/Throat: Oropharynx is clear and moist. No oropharyngeal exudate.  Eyes: Conjunctivae are normal. Pupils are equal, round, and reactive to light. No scleral icterus.  Neck: Normal range of motion. Neck supple.  Cardiovascular: Normal rate and regular rhythm.   No murmur heard. Pulmonary/Chest: Effort normal and breath sounds normal. No respiratory distress. He has no wheezes. He has no rales.  Abdominal: Soft. Bowel sounds are normal. There is no tenderness. There is no rebound and no guarding.  Musculoskeletal: Normal range of motion. He exhibits no edema.  Lymphadenopathy:    He has no cervical adenopathy.       Right: No supraclavicular adenopathy present.       Left: No supraclavicular adenopathy present.  Neurological: He is alert and oriented to person, place, and time. No  cranial nerve deficit. Gait normal.  Skin: Skin is  warm and dry.  Psychiatric: Mood, memory, affect and judgment normal.  Nursing note and vitals reviewed.    LABORATORY DATA:  I have reviewed the labs as listed.  CBC    Component Value Date/Time   WBC 3.5 (L) 06/29/2017 1231   RBC 2.79 (L) 06/29/2017 1231   HGB 9.4 (L) 06/29/2017 1231   HCT 27.3 (L) 06/29/2017 1231   PLT PENDING 06/29/2017 1231   MCV 97.8 06/29/2017 1231   MCH 33.7 06/29/2017 1231   MCHC 34.4 06/29/2017 1231   RDW 15.7 (H) 06/29/2017 1231   LYMPHSABS 0.4 (L) 06/29/2017 1231   MONOABS 0.6 06/29/2017 1231   EOSABS 0.0 06/29/2017 1231   BASOSABS 0.0 06/29/2017 1231   CMP Latest Ref Rng & Units 06/22/2017 06/15/2017 06/08/2017  Glucose 65 - 99 mg/dL 107(H) 122(H) 104(H)  BUN 6 - 20 mg/dL _0 Creatinine 0.61 - 1.24 mg/dL 1.19 1.12 1.17  Sodium 135 - 145 mmol/L 135 136 135  Potassium 3.5 - 5.1 mmol/L 4.1 4.2 4.3  Chloride 101 - 111 mmol/L 103 106 103  CO2 22 - 32 mmol/L _1 Calcium 8.9 - 10.3 mg/dL 9.1 8.4(L) 8.8(L)  Total Protein 6.5 - 8.1 g/dL 6.1(L) 5.9(L) 6.0(L)  Total Bilirubin 0.3 - 1.2 mg/dL 0.6 0.5 0.9  Alkaline Phos 38 - 126 U/L 19(L) 19(L) 20(L)  AST 15 - 41 U/L 18 21 35  ALT 17 - 63 U/L _2 PENDING LABS:    DIAGNOSTIC IMAGING:  *Images and radiologic reports reviewed independently and agree with finds as listed below.  PET scan: 12/31/16      Mammogram 04/06/17       PATHOLOGY:  (R) humerus bone path: 01/21/10 (re-evaluated on 04/09/17)     Genetics report: 01/22/14    Bone marrow biopsy: 02/02/17   Cytogenetics report: 02/10/17       ASSESSMENT & PLAN:   IgG kappa multiple myeloma:  -SPEP in 12/2016 revealed M-spike 2.3% with elevated IgG monoclonal protein (3,255 mg/dL) and elevated kappa light chains (2,129.1 mg/L) & kappa/lambda light chain ratio (272.96).  -Bone marrow biopsy on 02/02/17 demonstrated multiple myeloma; evidence of large peritravecular aggregates with kappa restricted plasma cells (66%  by aspirate, 30% by Cd138). Cytogenetics +11.   -Started therapy with Revlimid/Velcade/Decadron (RVD) on 03/01/17. To date he has completed up to cycle 6 day 15 with a partial response. -He has seen Dr. Norma Fredrickson for bone marrow transplant evaluation. Dr. Norma Fredrickson recommended changing his regimen to carfilzomib, cyclophosphamide, dexamethasone in an attempt to get a deeper response. A bone marrow biopsy after 2 additional cycles (if his light chain ratio improves) is recommended to assess cellularity. At that point a PET scan would be indicated to assess for FDG avid lesions and further plan of action. A DEXA scan is recommended to assess for osteoporosis.  - I have discussed treatment plan with carfilzomib, cyclophosphamide, dexamethasone in detail with the patient and discussed side effects and I have given him reading material on cyclophosphamide and carfizolmib. He is willing to proceed. Schedule cycle 1 for 07/12/17. Discussed patient's case with Dr. Norma Fredrickson today and he will be moving out the patient's repeat bone marrow biopsy and f/u appointment to after patient completes the 2 cycles. We will repeat the bone scan and PET after he completes the 2 cycles.  Anemia/Mild thrombocytopenia:  -Likely secondary to treatment.  -He had iron studies  performed at [facility] as well which demonstrated had low iron saturation and serum iron. Therefore I have started patient on ferrous sulfate 325 mg by mouth twice a day with meals. We'll continue to monitor his iron levels. I recommended for him to take a stool softener should he get constipated due to the iron tablets.  Bone mets:  -Continue monthly Xgeva to help prevent skeletal related events.  -I will order a DEXA scan on his next visit to assess for osteoporosis.  Hypocalcemia:  -Currently taking 2000 mg calcium with vitamin D.   Viral prophylaxis:  -Continue Acyclovir.   Male left breast cancer:  -Mammogram in 03/2017 with reduced size of  initial mass in left breast (originally 3 cm, now 1.1 cm). Since his last visit with me, I had our pathology team re-review his (R) humerus bone pathology from 2011. Staining was done which was ER+; there was not enough tissue to do HER2. However, ER+ staining supports metastatic breast cancer. -Genetics report dated 01/11/14: Variant of Unknown Significance, "[id number]."  -Reviewed with him that while metastatic breast cancer is not curable, it remains treatable.  Currently, his multiple myeloma is the more pressing issue in terms of treatment.  -Continue Tamoxifen.     Dispo:  -Return on 07/12/17 for cycle 1 of carfizolmib/cyclophosphamide/dexamethasone q28 days. Total of 2 cycles planned.  All questions were answered to patient's stated satisfaction. Encouraged patient to call with any new concerns or questions before his next visit to the cancer center and we can certain see him sooner, if needed.    Twana First, MD

## 2017-06-29 NOTE — Patient Instructions (Signed)
[facility] Discharge Instructions for Patients Receiving Chemotherapy   Beginning January 23rd 2017 lab work for the [facility] will be done in the  Main lab at [facility] on 1st floor. If you have a lab appointment with the [facility] please come in thru the  Main Entrance and check in at the main information desk   Today you received the following chemotherapy agents Velcade injection. Follow-up as scheduled. Call clinic for any questions or concerns  To help prevent nausea and vomiting after your treatment, we encourage you to take your nausea medications   If you develop nausea and vomiting, or diarrhea that is not controlled by your medication, call the clinic.  The clinic phone number is [phone]. Office hours are Monday-Friday 8:30am-5:00pm.  BELOW ARE SYMPTOMS THAT SHOULD BE REPORTED IMMEDIATELY:  *FEVER GREATER THAN 101.0 F  *CHILLS WITH OR WITHOUT FEVER  NAUSEA AND VOMITING THAT IS NOT CONTROLLED WITH YOUR NAUSEA MEDICATION  *UNUSUAL SHORTNESS OF BREATH  *UNUSUAL BRUISING OR BLEEDING  TENDERNESS IN MOUTH AND THROAT WITH OR WITHOUT PRESENCE OF ULCERS  *URINARY PROBLEMS  *BOWEL PROBLEMS  UNUSUAL RASH Items with * indicate a potential emergency and should be followed up as soon as possible. If you have an emergency after office hours please contact your primary care physician or go to the nearest emergency department.  Please call the clinic during office hours if you have any questions or concerns.   You may also contact the Patient Navigator at [phone] should you have any questions or need assistance in obtaining follow up care.      Resources For Cancer Patients and their Caregivers ? American Cancer Society: Can assist with transportation, wigs, general needs, runs Look Good Feel Better.        (260) 354-3293 ? Cancer Care: Provides financial assistance, online support groups, medication/co-pay assistance.   1-800-813-HOPE 815-006-1390) ? [facility] Assists [place] cancer patients and their families through emotional , educational and financial support.  (501) 611-7352 ? [place] DSS Where to apply for food stamps, Medicaid and utility assistance. 204-817-7983 ? [facility]: Transportation to medical appointments. 541-291-2547 ? Social Security Administration: May apply for disability if have a Stage IV cancer. 908-786-0777 6073065089 ? [employer], Disability and Transit Services: Assists with nutrition, care and transit needs. ([phone]

## 2017-06-29 NOTE — Progress Notes (Signed)
Johnny Lucas tolerated Velcade injection well without complaints or incident.Labs reviewed with Dr. Talbert Cage prior to administering this medication Pt discharged self ambulatory in satisfactory condition accompanied by his wife

## 2017-07-05 ENCOUNTER — Encounter (HOSPITAL_COMMUNITY): Payer: Self-pay | Admitting: Emergency Medicine

## 2017-07-05 NOTE — Progress Notes (Signed)
Spoke with Wells Guiles from [facility] transplant and she is going to get pt set up for BMBX and to see Dr Janice Norrie at the end of cycle 2.  She will call me back with these appts.

## 2017-07-06 ENCOUNTER — Inpatient Hospital Stay (HOSPITAL_COMMUNITY): Payer: Medicare Other

## 2017-07-06 ENCOUNTER — Other Ambulatory Visit (HOSPITAL_COMMUNITY): Payer: Medicare Other

## 2017-07-06 ENCOUNTER — Encounter (HOSPITAL_COMMUNITY): Payer: Self-pay | Admitting: Emergency Medicine

## 2017-07-06 MED ORDER — PROCHLORPERAZINE MALEATE 10 MG PO TABS: 10.0000 mg | ORAL_TABLET | Freq: Four times a day (QID) | ORAL | 2 refills | Status: DC | PRN

## 2017-07-06 MED ORDER — ONDANSETRON HCL 8 MG PO TABS: 8.0000 mg | ORAL_TABLET | Freq: Three times a day (TID) | ORAL | 2 refills | Status: DC | PRN

## 2017-07-06 NOTE — Progress Notes (Signed)
Chemotherapy teaching pulled together and appts made. 

## 2017-07-06 NOTE — Patient Instructions (Addendum)
[facility]   CHEMOTHERAPY INSTRUCTIONS  You have multiple myeloma.  We are going to treat you with 2 cycles of carfilzomib and cyclophosphamide.  You will then see Dr Tiana Loft at [facility] will see the doctor regularly throughout treatment.  We monitor your lab work prior to every treatment.  The doctor monitors your response to treatment by the way you are feeling, your blood work, and scans periodically. There will be wait times during your treatment.  It will take about 30 minutes to 1 hour for your lab work to result.  There will be wait time while your medications are mixed in the pharmacy.     You will receive premedications prior to your treatment: Premeds: Aloxi - high powered nausea/vomiting prevention medication used for chemotherapy patients. Dexamethasone - steroid - given to reduce the risk of you having an allergic type reaction to the chemotherapy. Dex can cause you to feel energized, nervous/anxious/jittery, make you have trouble sleeping, and/or make you feel hot/flushed in the face/neck and/or look pink/red in the face/neck. These side effects will pass as the Dex wears off. (takes 20 minutes to infuse)   POTENTIAL SIDE EFFECTS OF TREATMENT:  Carfilzomib (Kyprolis)  About This Drug Carfilzomib is used to treat cancer. It is given in the vein (IV). This will take 30 minutes to infuse.   You will have 250 mL of fluid before you get this drug.  This will help protect your kidneys.  Possible Side Effects . A decrease in the number of red blood cells. This may make you tired and weak. . A decrease in the number of platelets. This may raise your risk of bleeding. . Tiredness . Nausea . Loose bowel movements (diarrhea) . Fever . Cough and trouble breathing . Headache . Swelling of your legs, ankles and/or feet Note: Each of the side effects above was reported in 20% or greater of patients treated with carfilzomib. Not all possible  side effects are included above.  Warnings and Precautions . Congestive heart failure and/or heart attack which rarely could be life-threatening. You may be short of breath. Your arms, hands, legs and feet may swell. . Changes in your kidney function which can very rarely cause kidney failure . Tumor lysis: This drug may act on the cancer cells very quickly. This may affect how your kidneys work. Tumor lysis can be life-threatening. . Inflammation (swelling) of the lungs or fluid build-up in your lungs. In rare cases respiratory failure has resulted, which can be life-threatening. You may have a dry cough or trouble breathing which may be severe. . High blood pressure in arteries of the lungs can very rarely occur. . High blood pressure, which can very rarely be life-threatening . Blood clots and events such as heart attack. A blood clot in your leg may cause your leg to swell, appear red and warm, and/or cause pain. A blood clot in your lungs may cause trouble breathing, pain when breathing, and/or chest pain. Women should discuss choice of contraception and risk of blood clots with their medical team. . While you are getting this drug in your vein (IV), you may have a reaction to the drug. Sometimes you may be given medication to stop or lessen these side effects. Your nurse will check you closely for these signs: fever or shaking chills, flushing, facial swelling, feeling dizzy, headache, trouble breathing, rash, itching, chest tightness, or chest pain. These reactions may happen after your infusion. If  this happens, call 911 for emergency care. . Abnormal bleeding which can very rarely be life-threatening- symptoms may be coughing up blood, throwing up blood (may look like coffee grounds), red or black tarry bowel movements, abnormally heavy menstrual flow, nosebleeds or any other unusual bleeding. . Severe decrease in platelets which can increase your risk of bleeding. . Rare blood  vessels disorder called thrombotic microangiopathy which can affect some of your organs in your body such as your kidneys. This can rarely be life-threatening. . Changes in your liver function which can very rarely cause liver failure which can rarely be lifethreatening. . Increased risk of toxicity or life-threatening events when used in combination with specific chemotherapy agents when used in patients who are not eligible for transplant. . Changes in your central nervous system can happen. The central nervous system is made up of your brain and spinal cord. You could feel extreme tiredness, agitation, confusion, hallucinations (see or hear things that are not there), trouble understanding or speaking, loss of control of your bowels or bladder, eyesight changes, numbness or lack of strength to your arms, legs, face, or body, and coma. If you start to have any of these symptoms let your doctor know right away. Note: Some of the side effects above are very rare. If you have concerns and/or questions, please discuss them with your medical team.  Important Information . This drug may be present in the saliva, tears, sweat, urine, stool, vomit, semen, and vaginal secretions. Talk to your doctor and/or your nurse about the necessary precautions to take during this time.  Treating Side Effects . Manage tiredness by pacing your activities for the day. . Be sure to include periods of rest between energy-draining activities. . Take your temperature as your doctor or nurse tells you, and whenever you feel like you may have a fever. . To help decrease bleeding, use a soft toothbrush. Check with your nurse before using dental floss. . Be very careful when using knives or tools. . Use an electric shaver instead of a razor. . Drink plenty of fluids (a minimum of eight glasses per day is recommended). . If you throw up or have loose bowel movements, you should drink more fluids so that you do  not become dehydrated (lack water in the body from losing too much fluid). . To help with nausea and vomiting, eat small, frequent meals instead of three large meals a day. Choose foods and drinks that are at room temperature. Ask your nurse or doctor about other helpful tips and medicine that is available to help or stop lessen these symptoms. . If you get diarrhea, eat low-fiber foods that are high in protein and calories and avoid foods that can irritate your digestive tracts or lead to cramping. . Ask your nurse or doctor about medicine that can lessen or stop your diarrhea. Marland Kitchen Keeping your pain under control is important to your well-being. Please tell your doctor or nurse if you are experiencing pain. . Infusion reactions may occur after your infusion. If this happens, call 911 for emergency care.  Food and Drug Interactions . There are no known interactions of carfilzomib with food and with other medications. . Tell your doctor and pharmacist about all the medicines and dietary supplements (vitamins, minerals, herbs and others) that you are taking at this time. The safety and use of dietary supplements and alternative diets are often not known. Using these might affect your cancer or interfere with your treatment. Until more is  known, you should not use dietary supplements or alternative diets without your cancer doctor's help.  When to Call the Doctor Call your doctor or nurse if you have any of these symptoms and/or any new or unusual symptoms: . Fever of 100.5 F (38 C) or higher . Chills . Fatigue that interferes with your daily activities . Feeling dizzy or lightheaded . Easy bleeding or bruising . Coughing up yellow, green, or bloody mucus . Wheezing or trouble breathing . Chest pain or symptoms of a heart attack. Most heart attacks involve pain in the center of the chest that lasts more than a few minutes. The pain may go away and come back or it can be constant. It can  feel like pressure, squeezing, fullness, or pain. Sometimes pain is felt in one or both arms, the back, neck, jaw, or stomach. If any of these symptoms last 2 minutes, call 911. . Blood in your urine, vomit (bright red or coffee-ground) and/or stools ( bright red, or black/tarry) . Coughing up blood . A headache that does not go away . Blurry vision or other changes in eyesight . Your leg or arm is swollen, red, warm and/or painful . Confusion and/or agitation . Hallucinations . Trouble understanding or speaking . Numbness or lack of strength to your arms, legs, face, or body . Signs of infusion reaction: fever or shaking chills, flushing, facial swelling, feeling dizzy, headache, trouble breathing, rash, itching, chest tightness, or chest pain. . Signs of possible liver problems: dark urine, pale bowel movements, bad stomach pain, feeling very tired and weak, unusual itching, or yellowing of the eyes or skin . Decreased urine, or very dark urine . Nausea that stops you from eating or drinking and/or is not relieved by prescribed medicines . Throwing up more than 3 times a day . Loose bowel movements (diarrhea) 4 times a day or loose bowel movements with lack of strength or a feeling of being dizzy . Weight gain of 5 pounds in one week (fluid retention) . Pain that does not go away or is not relieved by prescribed medicine . If you think you may be pregnant or may have impregnated your partner  Reproduction Warnings . Pregnancy warning: This drug can have harmful effects on the unborn baby. Women of child bearing potential should use effective methods of birth control during your cancer treatment and for at least 1 month after treatment. Men with male partners of child bearing potential should use effective methods of birth control during your cancer treatment and for at least 3 months after your cancer treatment. Let your doctor know right away if you think you may be pregnant or may  have impregnated your partner. . Breastfeeding warning: It is not known if this drug passes into breast milk. For this reason, women should talk to their doctor about the risks and benefits of breast feeding during treatment with this drug because this drug may enter the breast milk and cause harm to a breast feeding baby . Fertility warning: This drug is not expected to affect your ability to have children in the future. Human fertility studies have not been done with this drug.    Cyclophosphamide (Cytoxan)  About This Drug Cyclophosphamide is used to treat cancer. It is given orally (by mouth) and in the vein (IV).  This takes 30 minutes to infuse.  Possible Side Effects . A decrease in the number of white blood cells which may raise your risk of infection . Infection .  Fever . Fever in the setting of decreased white blood cells, which is a serious condition that can be lifethreatening . Nausea and throwing up (vomiting) . Loose bowel movements (diarrhea) . Hair loss. Hair loss is often temporary, although with certain medicine, hair loss can sometimes be permanent. Hair loss may happen suddenly or gradually. If you lose hair, you may lose it from your head, face, armpits, pubic area, chest, and/or legs. You may also notice your hair getting thin. Note: Not all possible side effects are included above.  Warnings and Precautions . Severe bone marrow depression, which can be life-threatening. This is a decrease in the number of white blood cells, red blood cells, and platelets. This may raise your risk of infection, make you tired and weak (fatigue), and raise your risk of bleeding. . Abnormal heart beat and/or changes in the tissue of the heart, which can be life-threatening. Some changes may happen that can cause your heart to have less ability to pump blood. . Effects on the bladder and kidneys that may be life-threatening. This drug may cause inflammation (swelling),  irritation and bleeding in the bladder and/or kidneys. You may have blood in your urine. . Changes in your liver function and blockage of small veins in the liver, which can cause liver failure and be life-threatening . Inflammation (swelling) of the lungs, and changes to the small vesels of your lungs. You may have a dry cough or trouble breathing. . This drug may cause slow wound healing . Electrolyte changes, especially a decrease in sodium which can be life-threatening . This drug may raise your risk of getting a second cancer . These side effects may be more severe if you are receiving high doses of this medication included in pre-transplant chemotherapy Note: Some of the side effects above are very rare. If you have concerns and/or questions, please discuss them with your medical team.  Important Information . If you must have emergency surgery or have an accident that results in a wound, tell the doctor that you are on cyclophosphamide.  Treating Side Effects . Manage tiredness by pacing your activities for the day. . Be sure to include periods of rest between energy-draining activities. . To decrease infection, wash your hands regularly. . Avoid close contact with people who have a cold, the flu, or other infections. . Take your temperature as your doctor or nurse tells you, and whenever you feel like you may have a fever. . To help decrease bleeding, use a soft toothbrush. Check with your nurse before using dental floss. . Be very careful when using knives or tools. . Use an electric shaver instead of a razor. Marland Kitchen Keeping your pain under control is important to your well-being. Please tell your doctor or nurse if you are experiencing pain. . Drink plenty of fluids (a minimum of eight glasses per day is recommended). . If you throw up or have loose bowel movements, you should drink more fluids so that you do not become dehydrated (lack water in the body from losing too much  fluid). . If you get diarrhea, eat low-fiber foods that are high in protein and calories and avoid foods that can irritate your digestive tracts or lead to cramping. . Ask your nurse or doctor about medicine that can lessen or stop your diarrhea. . To help with nausea and vomiting, eat small, frequent meals instead of three large meals a day. Choose foods and drinks that are at room temperature. Ask your nurse or  doctor about other helpful tips and medicine that is available to help or stop lessen these symptoms. . To help with hair loss, wash with a mild shampoo and avoid washing your hair every day. . Avoid rubbing your scalp, pat your hair or scalp dry. . Avoid coloring your hair. . Limit your use of hair spray, electric curlers, blow dryers, and curling irons. . If you are interested in getting a wig, talk to your nurse. You can also call the [facility] at 800-ACS-2345 to find out information about the "Look Good, Feel Better" program close to where you live. It is a free program where women getting chemotherapy can learn about wigs, turbans and scarves as well as makeup techniques and skin and nail care.  Food and Drug Interactions . There are no known interactions of cyclophosphamide with food. . Check with your doctor or pharmacist about all other prescription medicines and over-the-counter medicines and dietary supplements (vitamins, minerals, herbs and others) you are taking before starting this medicine as there are known drug interactions with cyclophosphamide Also, check with your doctor or pharmacist before starting any new prescription or over-the-counter medicines, or dietary supplement to make sure that there are no interactions.  When to Call the Doctor Call your doctor or nurse if you have any of these symptoms and/or any new or unusual symptoms: . Fever of 100.5 F (38 C) or higher . Chills . Fatigue that interferes with your daily activities . Feeling  dizzy or lightheaded . Easy bleeding or bruising . Dry cough . Trouble breathing . Feeling that your heart is beating in a fast or not normal way (palpitations) . Pain in your chest . Pain in your mouth or throat that makes it hard to eat or drink . Nausea that stops you from eating or drinking and/or is not relieved by prescribed medicines . Throwing up more than 3 times a day . Loose bowel movements (diarrhea) 4 times a day or loose bowel movements with lack of strength or a feeling of being dizzy . Decreased urine, or very dark urine . Pain when passing urine; blood in urine . Decreased urine or difficulty urinating . Signs of possible liver problems: dark urine, pale bowel movements, bad stomach pain, feeling very tired and weak, unusual itching, or yellowing of the eyes or skin . If you think you may be pregnant or may have impregnated your partner  Reproduction Warnings . Pregnancy warning: This drug can have harmful effects on the unborn baby. Women of childbearing potential should use effective methods of birth control during your cancer treatment and for at least 1 year after treatment. Men with male partners who are pregnant or may become pregnant should use a condom during your cancer treatment and for at least 4 months after your cancer treatment. Let your doctor know right away if you think you may be pregnant or may have impregnated your partner. . Breastfeeding warning: This drug passes into breast milk. For this reason, women should talk to their doctor about the risks and benefits of breast feeding during treatment with this drug because this drug may enter the breast milk and cause harm to a breast feeding baby. . Fertility warning: In men and women both, this drug may affect your ability to have children in the future. Talk with your doctor or nurse if you plan to have children. Ask for information on sperm or egg banking.   SELF CARE ACTIVITIES WHILE ON  CHEMOTHERAPY: Hydration Increase  your fluid intake 48 hours prior to treatment and drink at least 8 to 12 cups (64 ounces) of water/decaff beverages per day after treatment. You can still have your cup of coffee or soda but these beverages do not count as part of your 8 to 12 cups that you need to drink daily. No alcohol intake.  Medications Continue taking your normal prescription medication as prescribed.  If you start any new herbal or new supplements please let us know first to make sure it is safe.  Mouth Care Have teeth cleaned professionally before starting treatment. Keep dentures and partial plates clean. Use soft toothbrush and do not use mouthwashes that contain alcohol. Biotene is a good mouthwash that is available at most pharmacies or may be ordered by calling [phone]. Use warm salt water gargles (1 teaspoon salt per 1 quart warm water) before and after meals and at bedtime. Or you may rinse with 2 tablespoons of three-percent hydrogen peroxide mixed in eight ounces of water. If you are still having problems with your mouth or sores in your mouth please call the clinic. If you need dental work, please let the doctor know before you go for your appointment so that we can coordinate the best possible time for you in regards to your chemo regimen. You need to also let your dentist know that you are actively taking chemo. We may need to do labs prior to your dental appointment.   Skin Care Always use sunscreen that has not expired and with SPF (Sun Protection Factor) of 50 or higher. Wear hats to protect your head from the sun. Remember to use sunscreen on your hands, ears, face, & feet.  Use good moisturizing lotions such as udder cream, eucerin, or even Vaseline. Some chemotherapies can cause dry skin, color changes in your skin and nails.    . Avoid long, hot showers or baths. . Use gentle, fragrance-free soaps and laundry detergent. . Use moisturizers, preferably creams or  ointments rather than lotions because the thicker consistency is better at preventing skin dehydration. Apply the cream or ointment within 15 minutes of showering. Reapply moisturizer at night, and moisturize your hands every time after you wash them.  Hair Loss (if your doctor says your hair will fall out)  . If your doctor says that your hair is likely to fall out, decide before you begin chemo whether you want to wear a wig. You may want to shop before treatment to match your hair color. . Hats, turbans, and scarves can also camouflage hair loss, although some people prefer to leave their heads uncovered. If you go bare-headed outdoors, be sure to use sunscreen on your scalp. . Cut your hair short. It eases the inconvenience of shedding lots of hair, but it also can reduce the emotional impact of watching your hair fall out. . Don't perm or color your hair during chemotherapy. Those chemical treatments are already damaging to hair and can enhance hair loss. Once your chemo treatments are done and your hair has grown back, it's OK to resume dyeing or perming hair. With chemotherapy, hair loss is almost always temporary. But when it grows back, it may be a different color or texture. In older adults who still had hair color before chemotherapy, the new growth may be completely gray.  Often, new hair is very fine and soft.  Infection Prevention Please wash your hands for at least 30 seconds using warm soapy water. Handwashing is the #1 way to prevent  the spread of germs. Stay away from sick people or people who are getting over a cold. If you develop respiratory systems such as green/yellow mucus production or productive cough or persistent cough let us know and we will see if you need an antibiotic. It is a good idea to keep a pair of gloves on when going into grocery stores/[employer] to decrease your risk of coming into contact with germs on the carts, etc. Carry alcohol hand gel with you at all times and  use it frequently if out in public. If your temperature reaches 100.5 or higher please call the clinic and let us know.  If it is after hours or on the weekend please go to the ER if your temperature is over 100.5.  Please have your own personal thermometer at home to use.    Sex and bodily fluids If you are going to have sex, a condom must be used to protect the person that isn't taking chemotherapy. Chemo can decrease your libido (sex drive). For a few days after chemotherapy, chemotherapy can be excreted through your bodily fluids.  When using the toilet please close the lid and flush the toilet twice.  Do this for a few day after you have had chemotherapy.     Effects of chemotherapy on your sex life Some changes are simple and won't last long. They won't affect your sex life permanently. Sometimes you may feel: . too tired . not strong enough to be very active . sick or sore  . not in the mood . anxious or low Your anxiety might not seem related to sex. For example, you may be worried about the cancer and how your treatment is going. Or you may be worried about money, or about how you family are coping with your illness. These things can cause stress, which can affect your interest in sex. It's important to talk to your partner about how you feel. Remember - the changes to your sex life don't usually last long. There's usually no medical reason to stop having sex during chemo. The drugs won't have any long term physical effects on your performance or enjoyment of sex. Cancer can't be passed on to your partner during sex  Contraception It's important to use reliable contraception during treatment. Avoid getting pregnant while you or your partner are having chemotherapy. This is because the drugs may harm the baby. Sometimes chemotherapy drugs can leave a man or woman infertile.  This means you would not be able to have children in the future. You might want to talk to someone about permanent  infertility. It can be very difficult to learn that you may no longer be able to have children. Some people find counselling helpful. There might be ways to preserve your fertility, although this is easier for men than for women. You may want to speak to a fertility expert. You can talk about sperm banking or harvesting your eggs. You can also ask about other fertility options, such as donor eggs. If you have or have had breast cancer, your doctor might advise you not to take the contraceptive pill. This is because the hormones in it might affect the cancer.  It is not known for sure whether or not chemotherapy drugs can be passed on through semen or secretions from the vagina. Because of this some doctors advise people to use a barrier method if you have sex during treatment. This applies to vaginal, anal or oral sex. Generally, doctors advise a  barrier method only for the time you are actually having the treatment and for about a week after your treatment. Advice like this can be worrying, but this does not mean that you have to avoid being intimate with your partner. You can still have close contact with your partner and continue to enjoy sex.  Animals If you have cats or birds we just ask that you not change the litter or change the cage.  Please have someone else do this for you while you are on chemotherapy.   Food Safety During and After Cancer Treatment Food safety is important for people both during and after cancer treatment. Cancer and cancer treatments, such as chemotherapy, radiation therapy, and stem cell/bone marrow transplantation, often weaken the immune system. This makes it harder for your body to protect itself from foodborne illness, also called food poisoning. Foodborne illness is caused by eating food that contains harmful bacteria, parasites, or viruses.  Foods to avoid Some foods have a higher risk of becoming tainted with bacteria. These include: Marland Kitchen Unwashed fresh fruit and  vegetables, especially leafy vegetables that can hide dirt and other contaminants . Raw sprouts, such as alfalfa sprouts . Raw or undercooked beef, especially ground beef, or other raw or undercooked meat and poultry . Fatty, fried, or spicy foods immediately before or after treatment.  These can sit heavy on your stomach and make you feel nauseous. . Raw or undercooked shellfish, such as oysters. . Sushi and sashimi, which often contain raw fish.  . Unpasteurized beverages, such as unpasteurized fruit juices, raw milk, raw yogurt, or cider . Undercooked eggs, such as soft boiled, over easy, and poached; raw, unpasteurized eggs; or foods made with raw egg, such as homemade raw cookie dough and homemade mayonnaise Simple steps for food safety Shop smart. . Do not buy food stored or displayed in an unclean area. . Do not buy bruised or damaged fruits or vegetables. . Do not buy cans that have cracks, dents, or bulges. . Pick up foods that can spoil at the end of your shopping trip and store them in a cooler on the way home. Prepare and clean up foods carefully. . Rinse all fresh fruits and vegetables under running water, and dry them with a clean towel or paper towel. . Clean the top of cans before opening them. . After preparing food, wash your hands for 20 seconds with hot water and soap. Pay special attention to areas between fingers and under nails. . Clean your utensils and dishes with hot water and soap. Marland Kitchen Disinfect your kitchen and cutting boards using 1 teaspoon of liquid, unscented bleach mixed into 1 quart of water.   Dispose of old food. . Eat canned and packaged food before its expiration date (the "use by" or "best before" date). . Consume refrigerated leftovers within 3 to 4 days. After that time, throw out the food. Even if the food does not smell or look spoiled, it still may be unsafe. Some bacteria, such as Listeria, can grow even on foods stored in the refrigerator if they are  kept for too long. Take precautions when eating out. . At restaurants, avoid buffets and salad bars where food sits out for a long time and comes in contact with many people. Food can become contaminated when someone with a virus, often a norovirus, or another "bug" handles it. . Put any leftover food in a "to-go" container yourself, rather than having the server do it. And, refrigerate leftovers as  soon as you get home. . Choose restaurants that are clean and that are willing to prepare your food as you order it cooked.    MEDICATIONS: Acyclovir (ZOVIRAX) 400 MG tablet:  Take 1 tablet (400 mg total) by mouth 2 (two) times daily.   Dexamethasone 85m tablet. Take 10 tablets (473m on days 1,8,15, and 22 of chemo. Repeat every 28 days. Take with food.                                                                                                                                                               Zofran/Ondansetron 46m39mablet. Take 1 tablet every 8 hours as needed for nausea/vomiting. (#1 nausea med to take, this can constipate)  Compazine/Prochlorperazine 68m50mblet. Take 1 tablet every 6 hours as needed for nausea/vomiting. (#2 nausea med to take, this can make you sleepy)    Over-the-Counter Meds:  Miralax 1 capful in 8 oz of fluid daily. May increase to two times a day if needed. This is a stool softener. If this doesn't work proceed you can add:  Senokot S-start with 1 tablet two times a day and increase to 4 tablets two times a day if needed. (total of 8 tablets in a 24 hour period). This is a stimulant laxative.   Call us [place] does not help your bowels move.   Imodium 2mg 4msule. Take 2 capsules after the 1st loose stool and then 1 capsule every 2 hours until you go a total of 12 hours without having a loose stool. Call the [place] continue. If diarrhea occurs @ bedtime, take 2 capsules @ bedtime. Then take 2 capsules every 4 hours until morning.  Call [place] Constipation Sheet *Miralax in 8 oz of fluid daily.  May increase to two times a day if needed.  This is a stool softener.  If this not enough to keep your bowel regular:  You can add:  *Senokot S, start with one tablet twice a day and can increase to 4 tablets twice a day if needed.  This is a stimulant laxative.   Sometimes when you take pain medication you need BOTH a medicine to keep your stool soft and a medicine to help your bowel push it out!  Please call if the above does not work for you.   Do not go more than 2 days without a bowel movement.  It is very important that you do not become constipated.  It will make you feel sick to your stomach (nausea) and can cause abdominal pain and vomiting.    Diarrhea Sheet  If you are having loose stools/diarrhea, please purchase Imodium and begin taking as outlined:  At the first sign of poorly formed or  loose stools you should begin taking Imodium(loperamide) 2 mg capsules.  Take two caplets (53m) followed by one caplet (227m every 2 hours until you have had no diarrhea for 12 hours.  During the night take two caplets (62m22mat bedtime and continue every 4 hours during the night until the morning.  Stop taking Imodium only after there is no sign of diarrhea for 12 hours.    Always call the [place] you are having loose stools/diarrhea that you can't get under control.  Loose stools/disrrhea leads to dehydration (loss of water) in your body.  We have other options of trying to get the loose stools/diarrhea to stopped but you must let us [place]!    Nausea Sheet  Zofran/Ondansetron 8mg72mblet. Take 1 tablet every 8 hours as needed for nausea/vomiting. (#1 nausea med to take, this can constipate)  Compazine/Prochlorperazine 10mg25mlet. Take 1 tablet every 6 hours as needed for nausea/vomiting. (#2 nausea med to take, this can make you sleepy)  You can take these medications together or separately.  We would  first like for you to try the Ondansetron by itself and then take the Prochloperizine if needed. But you are allowed to take both medications at the same time if your nausea is that severe.  If you are having persistent nausea (nausea that does not stop) please take these medications on a staggered schedule so that the nausea medication stays in your body.  Please call the [place] the amount of nausea that you are experiencing.  If you begin to vomit, you need to call the [facility] [place] it is the weekend and you have vomited more than one time and cant get it to stop-go to the Emergency Room.  Persistent nausea/vomiting can lead to dehydration (loss of fluid in your body) and will make you feel terrible.   Ice chips, sips of clear liquids, foods that are @ room temperature, crackers, and toast tend to be better tolerated.    SYMPTOMS TO REPORT AS SOON AS POSSIBLE AFTER TREATMENT:  FEVER GREATER THAN 100.5 F  CHILLS WITH OR WITHOUT FEVER  NAUSEA AND VOMITING THAT IS NOT CONTROLLED WITH YOUR NAUSEA MEDICATION  UNUSUAL SHORTNESS OF BREATH  UNUSUAL BRUISING OR BLEEDING  TENDERNESS IN MOUTH AND THROAT WITH OR WITHOUT PRESENCE OF ULCERS  URINARY PROBLEMS  BOWEL PROBLEMS  UNUSUAL RASH    Wear comfortable clothing and clothing appropriate for easy access to any Portacath or PICC line. Let us [place] if there is anything that we can do to make your therapy better!   What to do if you need assistance after hours or on the weekends: CALL [phone] on the line, do not hang up.  You will hear multiple messages but at the end you will be connected with a nurse triage line.  They will contact the doctor if necessary.  Most of the time they will be able to assist you.  Do not call the hospital operator.      I have been informed and understand all of the instructions given to me and have received a copy. I have been instructed to call the clinic [phone] family physician as soon as possible for continued medical care, if indicated. I do not have any more questions at this time but understand that I may call the [place] Patient Navigator at [phone] office hours should I have questions or need  assistance in obtaining follow-up care.

## 2017-07-12 ENCOUNTER — Encounter (HOSPITAL_COMMUNITY): Payer: Self-pay | Admitting: Oncology

## 2017-07-12 ENCOUNTER — Encounter (HOSPITAL_COMMUNITY): Payer: Medicare Other

## 2017-07-12 ENCOUNTER — Other Ambulatory Visit (HOSPITAL_COMMUNITY): Payer: Self-pay | Admitting: Oncology

## 2017-07-12 ENCOUNTER — Encounter (HOSPITAL_COMMUNITY): Payer: Self-pay

## 2017-07-12 ENCOUNTER — Encounter (HOSPITAL_BASED_OUTPATIENT_CLINIC_OR_DEPARTMENT_OTHER): Payer: Medicare Other

## 2017-07-12 VITALS — BP 124/66 | HR 70 | Temp 97.7°F | Resp 18 | Wt 203.0 lb

## 2017-07-12 DIAGNOSIS — C50922 Malignant neoplasm of unspecified site of left male breast: Secondary | ICD-10-CM | POA: Diagnosis not present

## 2017-07-12 DIAGNOSIS — C7951 Secondary malignant neoplasm of bone: Secondary | ICD-10-CM

## 2017-07-12 DIAGNOSIS — C9000 Multiple myeloma not having achieved remission: Secondary | ICD-10-CM

## 2017-07-12 DIAGNOSIS — Z5111 Encounter for antineoplastic chemotherapy: Secondary | ICD-10-CM

## 2017-07-12 DIAGNOSIS — Z5112 Encounter for antineoplastic immunotherapy: Secondary | ICD-10-CM | POA: Diagnosis present

## 2017-07-12 DIAGNOSIS — Z923 Personal history of irradiation: Secondary | ICD-10-CM | POA: Diagnosis not present

## 2017-07-12 DIAGNOSIS — Z79810 Long term (current) use of selective estrogen receptor modulators (SERMs): Secondary | ICD-10-CM | POA: Diagnosis not present

## 2017-07-12 DIAGNOSIS — Z79899 Other long term (current) drug therapy: Secondary | ICD-10-CM | POA: Diagnosis not present

## 2017-07-12 DIAGNOSIS — Z170 Estrogen receptor positive status [ER+]: Secondary | ICD-10-CM

## 2017-07-12 LAB — COMPREHENSIVE METABOLIC PANEL
ALBUMIN: 3.6 g/dL (ref 3.5–5.0)
ALT: 17 U/L (ref 17–63)
AST: 16 U/L (ref 15–41)
Alkaline Phosphatase: 25 U/L — ABNORMAL LOW (ref 38–126)
Anion gap: 6 (ref 5–15)
BILIRUBIN TOTAL: 0.6 mg/dL (ref 0.3–1.2)
BUN: 14 mg/dL (ref 6–20)
CHLORIDE: 106 mmol/L (ref 101–111)
CO2: 27 mmol/L (ref 22–32)
Calcium: 9 mg/dL (ref 8.9–10.3)
Creatinine, Ser: 1.14 mg/dL (ref 0.61–1.24)
GFR calc Af Amer: >60 mL/min (ref >60)
GFR calc non Af Amer: >60 mL/min (ref >60)
GLUCOSE: 105 mg/dL — AB (ref 65–99)
POTASSIUM: 4.2 mmol/L (ref 3.5–5.1)
SODIUM: 139 mmol/L (ref 135–145)
Total Protein: 6.2 g/dL — ABNORMAL LOW (ref 6.5–8.1)

## 2017-07-12 LAB — CBC WITH DIFFERENTIAL/PLATELET
BASOS ABS: 0 10*3/uL (ref 0.0–0.1)
BASOS PCT: 1 %
Eosinophils Absolute: 0 10*3/uL (ref 0.0–0.7)
Eosinophils Relative: 1 %
HEMATOCRIT: 29.4 % — AB (ref 39.0–52.0)
Hemoglobin: 10.1 g/dL — ABNORMAL LOW (ref 13.0–17.0)
Lymphocytes Relative: 13 %
Lymphs Abs: 0.5 10*3/uL — ABNORMAL LOW (ref 0.7–4.0)
MCH: 34 pg (ref 26.0–34.0)
MCHC: 34.4 g/dL (ref 30.0–36.0)
MCV: 99 fL (ref 78.0–100.0)
MONO ABS: 0.1 10*3/uL (ref 0.1–1.0)
Monocytes Relative: 2 %
NEUTROS ABS: 3.6 10*3/uL (ref 1.7–7.7)
Neutrophils Relative %: 83 %
PLATELETS: 207 10*3/uL (ref 150–400)
RBC: 2.97 MIL/uL — ABNORMAL LOW (ref 4.22–5.81)
RDW: 15.9 % — AB (ref 11.5–15.5)
WBC: 4.2 10*3/uL (ref 4.0–10.5)

## 2017-07-12 MED ORDER — SODIUM CHLORIDE 0.9 % IV SOLN
Freq: Once | INTRAVENOUS | Status: AC
  Administered 2017-07-12: 12:00:00 via INTRAVENOUS

## 2017-07-12 MED ORDER — SODIUM CHLORIDE 0.9 % IV SOLN
300.0000 mg/m2 | Freq: Once | INTRAVENOUS | Status: AC
  Administered 2017-07-12: 640 mg via INTRAVENOUS
  Filled 2017-07-12: qty 32

## 2017-07-12 MED ORDER — DEXAMETHASONE SODIUM PHOSPHATE 10 MG/ML IJ SOLN
10.0000 mg | Freq: Once | INTRAMUSCULAR | Status: AC
  Administered 2017-07-12: 10 mg via INTRAVENOUS
  Filled 2017-07-12: qty 1

## 2017-07-12 MED ORDER — PALONOSETRON HCL INJECTION 0.25 MG/5ML
0.2500 mg | Freq: Once | INTRAVENOUS | Status: AC
  Administered 2017-07-12: 0.25 mg via INTRAVENOUS
  Filled 2017-07-12: qty 5

## 2017-07-12 MED ORDER — SODIUM CHLORIDE 0.9 % IV SOLN
Freq: Once | INTRAVENOUS | Status: AC
  Administered 2017-07-12: 13:00:00 via INTRAVENOUS

## 2017-07-12 MED ORDER — DEXTROSE 5 % IV SOLN
20.0000 mg/m2 | Freq: Once | INTRAVENOUS | Status: AC
  Administered 2017-07-12: 42 mg via INTRAVENOUS
  Filled 2017-07-12: qty 21

## 2017-07-12 MED ORDER — SODIUM CHLORIDE 0.9 % IV SOLN: 10.0000 mg | Freq: Once | INTRAVENOUS | Status: DC

## 2017-07-12 NOTE — Progress Notes (Signed)
Johnny Lucas tolerated chemo tx well without complaints or incident.Labs reviewed with Dr. Talbert Cage prior to administering these medications as well as chemo teaching and permit signed by [facility] RN. Peripheral IV flushed,saline locked and wrapped with gauze for use tomorrow. VSS upon discharge. Pt discharged self ambulatory in satisfactory condition accompanied by his wife.

## 2017-07-12 NOTE — Progress Notes (Signed)
Consent obtained.  Chemotherapy teaching completed.  New scheduled made.  Extensive teaching packet given.

## 2017-07-12 NOTE — Patient Instructions (Signed)
[facility] Discharge Instructions for Patients Receiving Chemotherapy   Beginning January 23rd 2017 lab work for the [facility] will be done in the  Main lab at [facility] on 1st floor. If you have a lab appointment with the [facility] please come in thru the  Main Entrance and check in at the main information desk   Today you received the following chemotherapy agents Cytoxan and Kyprolis. Follow-up as scheduled. Call clinic for any questions or concerns  To help prevent nausea and vomiting after your treatment, we encourage you to take your nausea medication   If you develop nausea and vomiting, or diarrhea that is not controlled by your medication, call the clinic.  The clinic phone number is [phone]. Office hours are Monday-Friday 8:30am-5:00pm.  BELOW ARE SYMPTOMS THAT SHOULD BE REPORTED IMMEDIATELY:  *FEVER GREATER THAN 101.0 F  *CHILLS WITH OR WITHOUT FEVER  NAUSEA AND VOMITING THAT IS NOT CONTROLLED WITH YOUR NAUSEA MEDICATION  *UNUSUAL SHORTNESS OF BREATH  *UNUSUAL BRUISING OR BLEEDING  TENDERNESS IN MOUTH AND THROAT WITH OR WITHOUT PRESENCE OF ULCERS  *URINARY PROBLEMS  *BOWEL PROBLEMS  UNUSUAL RASH Items with * indicate a potential emergency and should be followed up as soon as possible. If you have an emergency after office hours please contact your primary care physician or go to the nearest emergency department.  Please call the clinic during office hours if you have any questions or concerns.   You may also contact the Patient Navigator at [phone] should you have any questions or need assistance in obtaining follow up care.      Resources For Cancer Patients and their Caregivers ? American Cancer Society: Can assist with transportation, wigs, general needs, runs Look Good Feel Better.        778-114-4859 ? Cancer Care: Provides financial assistance, online support groups, medication/co-pay assistance.   1-800-813-HOPE (431)785-1204) ? [place] Assists [place] cancer patients and their families through emotional , educational and financial support.  509-779-7840 ? [place] DSS Where to apply for food stamps, Medicaid and utility assistance. 7076843089 ? [facility]: Transportation to medical appointments. 845-838-3252 ? Social Security Administration: May apply for disability if have a Stage IV cancer. 313-464-1460 515-439-6214 ? [employer], Disability and Transit Services: Assists with nutrition, care and transit needs. 610-747-0388

## 2017-07-13 ENCOUNTER — Other Ambulatory Visit (HOSPITAL_COMMUNITY): Payer: Self-pay | Admitting: Oncology

## 2017-07-13 ENCOUNTER — Other Ambulatory Visit (HOSPITAL_COMMUNITY): Payer: Medicare Other

## 2017-07-13 ENCOUNTER — Inpatient Hospital Stay (HOSPITAL_COMMUNITY): Payer: Medicare Other

## 2017-07-13 ENCOUNTER — Ambulatory Visit (HOSPITAL_COMMUNITY): Payer: Medicare Other

## 2017-07-13 ENCOUNTER — Encounter (HOSPITAL_BASED_OUTPATIENT_CLINIC_OR_DEPARTMENT_OTHER): Payer: Medicare Other

## 2017-07-13 VITALS — BP 137/64 | HR 63 | Temp 98.6°F | Resp 16 | Wt 204.2 lb

## 2017-07-13 DIAGNOSIS — C9000 Multiple myeloma not having achieved remission: Secondary | ICD-10-CM | POA: Diagnosis not present

## 2017-07-13 DIAGNOSIS — Z5112 Encounter for antineoplastic immunotherapy: Secondary | ICD-10-CM

## 2017-07-13 MED ORDER — SODIUM CHLORIDE 0.9 % IV SOLN
Freq: Once | INTRAVENOUS | Status: AC
  Administered 2017-07-13: 11:00:00 via INTRAVENOUS

## 2017-07-13 MED ORDER — DEXTROSE 5 % IV SOLN
20.0000 mg/m2 | Freq: Once | INTRAVENOUS | Status: AC
  Administered 2017-07-13: 42 mg via INTRAVENOUS
  Filled 2017-07-13: qty 21

## 2017-07-13 MED ORDER — DEXAMETHASONE SODIUM PHOSPHATE 10 MG/ML IJ SOLN
10.0000 mg | Freq: Once | INTRAMUSCULAR | Status: AC
  Administered 2017-07-13: 10 mg via INTRAVENOUS
  Filled 2017-07-13: qty 1

## 2017-07-13 MED ORDER — SODIUM CHLORIDE 0.9 % IV SOLN: 10.0000 mg | Freq: Once | INTRAVENOUS | Status: DC

## 2017-07-13 NOTE — Progress Notes (Signed)
Tolerated infusion w/o adverse reaction.  Alert, in no distress.  VSS.  Discharged ambulatory in c/o spouse.  

## 2017-07-14 ENCOUNTER — Telehealth (HOSPITAL_COMMUNITY): Payer: Self-pay

## 2017-07-14 ENCOUNTER — Other Ambulatory Visit (HOSPITAL_COMMUNITY): Payer: Self-pay | Admitting: Pharmacist

## 2017-07-14 ENCOUNTER — Ambulatory Visit (HOSPITAL_COMMUNITY): Payer: Medicare Other

## 2017-07-14 NOTE — Telephone Encounter (Signed)
See telephone encounter note.

## 2017-07-19 ENCOUNTER — Encounter (HOSPITAL_BASED_OUTPATIENT_CLINIC_OR_DEPARTMENT_OTHER): Payer: Medicare Other

## 2017-07-19 ENCOUNTER — Encounter (HOSPITAL_BASED_OUTPATIENT_CLINIC_OR_DEPARTMENT_OTHER): Payer: Medicare Other | Admitting: Oncology

## 2017-07-19 ENCOUNTER — Encounter (HOSPITAL_COMMUNITY): Payer: Self-pay

## 2017-07-19 ENCOUNTER — Encounter (HOSPITAL_COMMUNITY): Payer: Medicare Other

## 2017-07-19 ENCOUNTER — Ambulatory Visit (HOSPITAL_COMMUNITY): Payer: Medicare Other

## 2017-07-19 VITALS — BP 142/65 | HR 65 | Temp 97.6°F | Resp 18 | Wt 202.8 lb

## 2017-07-19 DIAGNOSIS — Z5111 Encounter for antineoplastic chemotherapy: Secondary | ICD-10-CM

## 2017-07-19 DIAGNOSIS — C9000 Multiple myeloma not having achieved remission: Secondary | ICD-10-CM

## 2017-07-19 DIAGNOSIS — D696 Thrombocytopenia, unspecified: Secondary | ICD-10-CM | POA: Diagnosis not present

## 2017-07-19 DIAGNOSIS — Z79810 Long term (current) use of selective estrogen receptor modulators (SERMs): Secondary | ICD-10-CM

## 2017-07-19 DIAGNOSIS — C50122 Malignant neoplasm of central portion of left male breast: Secondary | ICD-10-CM | POA: Diagnosis not present

## 2017-07-19 DIAGNOSIS — Z5112 Encounter for antineoplastic immunotherapy: Secondary | ICD-10-CM

## 2017-07-19 DIAGNOSIS — C7951 Secondary malignant neoplasm of bone: Secondary | ICD-10-CM

## 2017-07-19 DIAGNOSIS — R432 Parageusia: Secondary | ICD-10-CM | POA: Diagnosis not present

## 2017-07-19 DIAGNOSIS — Z923 Personal history of irradiation: Secondary | ICD-10-CM | POA: Diagnosis not present

## 2017-07-19 DIAGNOSIS — Z170 Estrogen receptor positive status [ER+]: Secondary | ICD-10-CM

## 2017-07-19 DIAGNOSIS — Z79899 Other long term (current) drug therapy: Secondary | ICD-10-CM | POA: Diagnosis not present

## 2017-07-19 DIAGNOSIS — C50922 Malignant neoplasm of unspecified site of left male breast: Secondary | ICD-10-CM | POA: Diagnosis not present

## 2017-07-19 DIAGNOSIS — D649 Anemia, unspecified: Secondary | ICD-10-CM

## 2017-07-19 DIAGNOSIS — C50021 Malignant neoplasm of nipple and areola, right male breast: Secondary | ICD-10-CM

## 2017-07-19 LAB — CBC WITH DIFFERENTIAL/PLATELET
Basophils Absolute: 0 10*3/uL (ref 0.0–0.1)
Basophils Relative: 0 %
EOS ABS: 0.1 10*3/uL (ref 0.0–0.7)
Eosinophils Relative: 2 %
HEMATOCRIT: 29.2 % — AB (ref 39.0–52.0)
HEMOGLOBIN: 9.9 g/dL — AB (ref 13.0–17.0)
LYMPHS ABS: 1.2 10*3/uL (ref 0.7–4.0)
Lymphocytes Relative: 19 %
MCH: 33.7 pg (ref 26.0–34.0)
MCHC: 33.9 g/dL (ref 30.0–36.0)
MCV: 99.3 fL (ref 78.0–100.0)
MONOS PCT: 5 %
Monocytes Absolute: 0.3 10*3/uL (ref 0.1–1.0)
NEUTROS ABS: 4.8 10*3/uL (ref 1.7–7.7)
NEUTROS PCT: 74 %
Platelets: 153 10*3/uL (ref 150–400)
RBC: 2.94 MIL/uL — AB (ref 4.22–5.81)
RDW: 15.3 % (ref 11.5–15.5)
WBC: 6.6 10*3/uL (ref 4.0–10.5)

## 2017-07-19 LAB — COMPREHENSIVE METABOLIC PANEL
ALBUMIN: 3.7 g/dL (ref 3.5–5.0)
ALK PHOS: 24 U/L — AB (ref 38–126)
ALT: 15 U/L — AB (ref 17–63)
AST: 16 U/L (ref 15–41)
Anion gap: 5 (ref 5–15)
BILIRUBIN TOTAL: 0.8 mg/dL (ref 0.3–1.2)
BUN: 17 mg/dL (ref 6–20)
CALCIUM: 9 mg/dL (ref 8.9–10.3)
CO2: 28 mmol/L (ref 22–32)
CREATININE: 1.33 mg/dL — AB (ref 0.61–1.24)
Chloride: 103 mmol/L (ref 101–111)
GFR calc non Af Amer: 53 mL/min — ABNORMAL LOW (ref >60)
GLUCOSE: 105 mg/dL — AB (ref 65–99)
Potassium: 4.1 mmol/L (ref 3.5–5.1)
SODIUM: 136 mmol/L (ref 135–145)
TOTAL PROTEIN: 6 g/dL — AB (ref 6.5–8.1)

## 2017-07-19 MED ORDER — DEXAMETHASONE SODIUM PHOSPHATE 10 MG/ML IJ SOLN
10.0000 mg | Freq: Once | INTRAMUSCULAR | Status: AC
  Administered 2017-07-19: 10 mg via INTRAVENOUS

## 2017-07-19 MED ORDER — CYCLOPHOSPHAMIDE CHEMO INJECTION 1 GM
300.0000 mg/m2 | Freq: Once | INTRAMUSCULAR | Status: AC
  Administered 2017-07-19: 640 mg via INTRAVENOUS
  Filled 2017-07-19: qty 32

## 2017-07-19 MED ORDER — CARFILZOMIB CHEMO INJECTION 60 MG
57.0000 mg/m2 | Freq: Once | INTRAVENOUS | Status: AC
  Administered 2017-07-19: 120 mg via INTRAVENOUS
  Filled 2017-07-19: qty 60

## 2017-07-19 MED ORDER — SODIUM CHLORIDE 0.9 % IV SOLN
10.0000 mg | Freq: Once | INTRAVENOUS | Status: DC
  Filled 2017-07-19: qty 1

## 2017-07-19 MED ORDER — SODIUM CHLORIDE 0.9 % IV SOLN
Freq: Once | INTRAVENOUS | Status: AC
  Administered 2017-07-19: 12:00:00 via INTRAVENOUS

## 2017-07-19 MED ORDER — PALONOSETRON HCL INJECTION 0.25 MG/5ML
0.2500 mg | Freq: Once | INTRAVENOUS | Status: AC
  Administered 2017-07-19: 0.25 mg via INTRAVENOUS

## 2017-07-19 MED ORDER — PALONOSETRON HCL INJECTION 0.25 MG/5ML
INTRAVENOUS | Status: AC
  Filled 2017-07-19: qty 5

## 2017-07-19 MED ORDER — DEXAMETHASONE SODIUM PHOSPHATE 10 MG/ML IJ SOLN
INTRAMUSCULAR | Status: AC
  Filled 2017-07-19: qty 1

## 2017-07-19 NOTE — Progress Notes (Signed)
Labs reviewed with Dr. Talbert Cage prior to administering chemotherapy                                                                      Johnny Lucas tolerated chemo tx well without complaints or incident. VSS upon discharge. Peripheral IV saline locked,flushed with NS and wrapped with gauze for use tomorrow. Pt discharged self ambulatory in satisfactory condition

## 2017-07-19 NOTE — Progress Notes (Signed)
Johnny Lucas, [place] [address]   CLINIC:  Medical Oncology/Hematology  PCP:  Johnny Percy, MD [place] [address] [phone]   REASON FOR VISIT:  Follow-up for Stage II multiple myeloma AND Stage IV breast cancer with bone mets   CURRENT THERAPY: carfizolmib/cytoxan/dex and Xgeva every 28 days AND Tamoxifen daily    BRIEF ONCOLOGIC HISTORY:    Breast cancer, male ([place])   12/31/2009 Initial Biopsy    Biopsy of L breast       12/31/2009 Pathology Results    Invasive ductal carcinoma, ER/PR+, HER 2 negative      12/31/2009 Imaging    Ultrasound showing a 2.43 x 1.85 x 3 cm hypoechoic spiculated mass in the 12 o clock L breast retroareolar region      01/01/2010 -  Anti-estrogen oral therapy    Tamoxifen 20 mg daily      01/06/2010 Imaging    Bone scan abnormal uptake in the diaphysis of the R humerus, abnormal in the R third, fifth and sixth ribs, lesion also noted in the sternum.      02/03/2010 Surgery    Rod placement and fixation of R humerus by Dr. Amedeo Lucas      02/05/2010 - 02/18/2010 Radiation Therapy    30Gy in 10 fractions of 3 Gy per fraction to R pathologic fracture      03/11/2010 -  Chemotherapy    Denosumab monthly, now every 3 months. Started at [facility]       06/09/2016 Imaging    Three hypermetabolic osseous lesions in the sternum, left ilium and right ilium, as discussed above, likely represent osseous metastases. At this time, these are not recognizable on the CT images. 2. No extra skeletal metastatic disease identified in the neck, chest, abdomen or pelvis.      10/13/2016 Progression    PET shows various new and enlarging osseous metastatic lesions with no definite extra osseous metastatic disease currently identified.       12/31/2016 Progression    1. Multifocal hypermetabolic osseous metastases throughout the axial and proximal appendicular skeleton, which are increased in size, number and  metabolism since 10/13/2016 PET-CT. 2. 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 3. No additional sites of hypermetabolic metastatic disease. 4. Chronic right mastoid sinusitis. 5. Aortic atherosclerosis.  One vessel coronary atherosclerosis.       Multiple myeloma (Johnny Lucas)   02/12/2017 Bone Marrow Biopsy    The marrow was variably cellular with large peritrabecular aggregates of kappa restricted plasma cells (66% by aspirate, 30% by Cd138). Cytogenetics +11.       03/01/2017 -  Chemotherapy    RVD       05/26/2017 Bone Marrow Biopsy    Performed at [facility]:  Plasma cell myeloma in a 30% cellularmarrow with decreased trilineage hematopoiesis and 42% kappalight chain restricted plasma cells on the aspirate smears andlarge aggregates on the core biopsy.       07/12/2017 -  Chemotherapy    Cycle 1 of carfizolmib/cyclophosphamide/dexamethasone          HISTORY OF PRESENT ILLNESS:       INTERVAL HISTORY:  Johnny Lucas 71 y.o. male here for routine follow-up for multiple myeloma and Stage IV metastatic breast cancer.   Patient presents today for cycle 1 day 8 of kyprolis/cytoxan/dex. Patient tolerated day 1 and day 2  well. He stated that on days 4-5 he started having achy muscle pains in his legs and headaches for which he took aleve and his symptoms resolved. He also started noting that he is having dysgeusia. He denies any nausea, vomiting, diarrhea. Denies any chest pain or shortness of breath.   REVIEW OF SYSTEMS:  Review of Systems  Constitutional: Negative for appetite change, chills, fatigue and fever.  HENT:  Negative.  Negative for lump/mass and nosebleeds.   Eyes: Negative.   Respiratory: Negative.  Negative for cough and shortness of breath.   Cardiovascular: Negative.  Negative for chest pain and leg swelling.  Gastrointestinal: Negative.   Negative for abdominal pain, blood in stool, constipation, diarrhea, nausea and vomiting.  Endocrine: Negative.   Genitourinary: Negative.  Negative for dysuria and hematuria.   Musculoskeletal: Negative for arthralgias, back pain and myalgias.  Skin: Negative.  Negative for rash.  Neurological: Negative.  Negative for dizziness and headaches.  Hematological: Negative.  Negative for adenopathy. Does not bruise/bleed easily.  Psychiatric/Behavioral: Negative.  Negative for depression and sleep disturbance. The patient is not nervous/anxious.      PAST MEDICAL/SURGICAL HISTORY:  Past Medical History:  Diagnosis Date  . Anxiety   . Bone metastases ([place]) 09/10/2016  . Breast cancer ([place]) 2011   Stave IV breast cancer; radiation and tamoxifen  . Breast cancer, male ([place])    Stave IV breast cancer; radiation and tamoxifen  Overview:  Left breast ca with mets bone  Overview:  METS TO BONE  . GERD (gastroesophageal reflux disease)   . Hypertension   . Macular degeneration   . Multiple myeloma ([place]) 02/18/2017   Past Surgical History:  Procedure Laterality Date  . BACK SURGERY    . HERNIA REPAIR    . right arm surgery       SOCIAL HISTORY:  Social History   Social History  . Marital status: Married    Spouse name: N/A  . Number of children: 3  . Years of education: N/A   Occupational History  . Not on file.   Social History Main Topics  . Smoking status: Never Smoker  . Smokeless tobacco: Former Systems developer  . Alcohol use 14.4 oz/week    24 Cans of beer per week  . Drug use: No  . Sexual activity: Not on file   Other Topics Concern  . Not on file   Social History Narrative  . No narrative on file    FAMILY HISTORY:  Family History  Problem Relation Age of Onset  . Stroke Mother   . Cancer Maternal Aunt        cancer NOS; died in her 46s  . Lung cancer Maternal Uncle        smoker    CURRENT MEDICATIONS:  Outpatient Encounter Prescriptions as of 07/19/2017    Medication Sig Note  . acyclovir (ZOVIRAX) 400 MG tablet Take 1 tablet (400 mg total) by mouth daily.   Marland Kitchen albuterol (PROVENTIL HFA;VENTOLIN HFA) 108 (90 Base) MCG/ACT inhaler Inhale 2 puffs into the lungs every 6 (six) hours as needed for wheezing or shortness of breath.   . ALPRAZolam (XANAX) 0.5 MG tablet Take 1 tablet (0.5 mg total) by mouth at bedtime as needed for anxiety.   Marland Kitchen aspirin EC 81 MG tablet Take 162 mg by mouth daily.  09/10/2016: Received from: [place]: Take 162 mg by mouth daily.  . Calcium Carb-Cholecalciferol (CALCIUM 500 + D3) 500-600 MG-UNIT TABS Take 2 tablets  by mouth daily.   . calcium carbonate (TUMS - DOSED IN MG ELEMENTAL CALCIUM) 500 MG chewable tablet Chew 3 tablets by mouth daily.    . calcium-vitamin D (OSCAL WITH D) 500-200 MG-UNIT TABS tablet Take 2 tablets by mouth daily.   . Carfilzomib (KYPROLIS IV) Inject into the vein. Day 1 and 2, day 8 and 9, day 15 and 16 every 28 days   . CYCLOPHOSPHAMIDE IV Inject into the vein. Days 1,8,15 every 28 days   . denosumab (XGEVA) 120 MG/1.7ML SOLN injection Inject 120 mg into the skin once. Every 28 days   . dexamethasone (DECADRON) 4 MG tablet Take 10 tablets ('40mg'$ ) on days 1,8,15, and 22 of chemo. Repeat every 28 days.   . ferrous sulfate 325 (65 FE) MG EC tablet Take 1 tablet (325 mg total) by mouth 2 (two) times daily after a meal.   . loratadine (CLARITIN) 10 MG tablet Take 1 tablet (10 mg total) by mouth daily. (Patient taking differently: Take 10 mg by mouth daily. Takes as needed.)   . metoprolol succinate (TOPROL-XL) 100 MG 24 hr tablet Take 100 mg by mouth daily.  01/04/2017: Received from: External Pharmacy  . Multiple Vitamins-Minerals (OCUVITE PO) Take by mouth daily.  09/10/2016: Received from: [place]: Take 1 tablet by mouth daily.  . ondansetron (ZOFRAN) 8 MG tablet Take 1 tablet (8 mg total) by mouth every 8 (eight) hours as needed for nausea or vomiting.   . pantoprazole  (PROTONIX) 40 MG tablet Take 40 mg by mouth daily. 03/29/2017: Takes prn  . polyethylene glycol (MIRALAX / GLYCOLAX) packet Take 17 g by mouth daily as needed.   . prochlorperazine (COMPAZINE) 10 MG tablet Take 1 tablet (10 mg total) by mouth every 6 (six) hours as needed for nausea or vomiting.   . sildenafil (REVATIO) 20 MG tablet TAKE UP TO FIVE TABLETS BY MOUTH EVERY DAY AS NEEDED   . tamoxifen (NOLVADEX) 20 MG tablet Take 1 tablet (20 mg total) by mouth daily.    No facility-administered encounter medications on file as of 07/19/2017.     ALLERGIES:  No Known Allergies   PHYSICAL EXAM:  ECOG Performance status: 1 - Symptomatic, but largely independent.   There were no vitals filed for this visit. There were no vitals filed for this visit.  Physical Exam  Constitutional: He is oriented to person, place, and time and well-developed, well-nourished, and in no distress. No distress.  HENT:  Head: Normocephalic.  Mouth/Throat: Oropharynx is clear and moist. No oropharyngeal exudate.  Eyes: Pupils are equal, round, and reactive to light. Conjunctivae are normal. No scleral icterus.  Neck: Normal range of motion. Neck supple.  Cardiovascular: Normal rate and regular rhythm.   No murmur heard. Pulmonary/Chest: Effort normal and breath sounds normal. No respiratory distress. He has no wheezes. He has no rales.  Abdominal: Soft. Bowel sounds are normal. There is no tenderness. There is no rebound and no guarding.  Musculoskeletal: Normal range of motion. He exhibits no edema.  Lymphadenopathy:    He has no cervical adenopathy.       Right: No supraclavicular adenopathy present.       Left: No supraclavicular adenopathy present.  Neurological: He is alert and oriented to person, place, and time. No cranial nerve deficit. Gait normal.  Skin: Skin is warm and dry.  Psychiatric: Mood, memory, affect and judgment normal.  Nursing note and vitals reviewed.    LABORATORY DATA:  I have  reviewed the labs as listed.  CBC    Component Value Date/Time   WBC 6.6 07/19/2017 1030   RBC 2.94 (L) 07/19/2017 1030   HGB 9.9 (L) 07/19/2017 1030   HCT 29.2 (L) 07/19/2017 1030   PLT 153 07/19/2017 1030   MCV 99.3 07/19/2017 1030   MCH 33.7 07/19/2017 1030   MCHC 33.9 07/19/2017 1030   RDW 15.3 07/19/2017 1030   LYMPHSABS 1.2 07/19/2017 1030   MONOABS 0.3 07/19/2017 1030   EOSABS 0.1 07/19/2017 1030   BASOSABS 0.0 07/19/2017 1030   CMP Latest Ref Rng & Units 07/19/2017 07/12/2017 06/29/2017  Glucose 65 - 99 mg/dL 105(H) 105(H) 133(H)  BUN 6 - 20 mg/dL '17 14 16  '$ Creatinine 0.61 - 1.24 mg/dL 1.33(H) 1.14 1.17  Sodium 135 - 145 mmol/L 136 139 135  Potassium 3.5 - 5.1 mmol/L 4.1 4.2 3.9  Chloride 101 - 111 mmol/L 103 106 103  CO2 22 - 32 mmol/L '28 27 23  '$ Calcium 8.9 - 10.3 mg/dL 9.0 9.0 9.3  Total Protein 6.5 - 8.1 g/dL 6.0(L) 6.2(L) 5.9(L)  Total Bilirubin 0.3 - 1.2 mg/dL 0.8 0.6 0.6  Alkaline Phos 38 - 126 U/L 24(L) 25(L) 22(L)  AST 15 - 41 U/L '16 16 20  '$ ALT 17 - 63 U/L 15(L) 17 18    PENDING LABS:    DIAGNOSTIC IMAGING:  *Images and radiologic reports reviewed independently and agree with finds as listed below.  PET scan: 12/31/16      Mammogram 04/06/17       PATHOLOGY:  (R) humerus bone path: 01/21/10 (re-evaluated on 04/09/17)     Genetics report: 01/22/14    Bone marrow biopsy: 02/02/17   Cytogenetics report: 02/10/17       ASSESSMENT & PLAN:   IgG kappa multiple myeloma:  -SPEP in 12/2016 revealed M-spike 2.3% with elevated IgG monoclonal protein (3,255 mg/dL) and elevated kappa light chains (2,129.1 mg/L) & kappa/lambda light chain ratio (272.96).  -Bone marrow biopsy on 02/02/17 demonstrated multiple myeloma; evidence of large peritravecular aggregates with kappa restricted plasma cells (66% by aspirate, 30% by Cd138). Cytogenetics +11.   -Started therapy with Revlimid/Velcade/Decadron (RVD) on 03/01/17. To date he has completed up to cycle 6 day  15 with a partial response. -He has seen Dr. Norma Fredrickson for bone marrow transplant evaluation. Dr. Norma Fredrickson recommended changing his regimen to carfilzomib, cyclophosphamide, dexamethasone in an attempt to get a deeper response. A bone marrow biopsy after 2 additional cycles (if his light chain ratio improves) is recommended to assess cellularity. At that point a PET scan would be indicated to assess for FDG avid lesions and further plan of action. A DEXA scan is recommended to assess for osteoporosis.  - Proceed with cycle 1 day 8 of carfilzomib, cyclophosphamide, dexamethasone. Repeat MM labs after he completes cycle 2. - We will repeat the bone scan and PET after he completes the 2 cycles.  Anemia/Mild thrombocytopenia:  -Likely secondary to treatment.  -He had iron studies performed at [facility] as well which demonstrated had low iron saturation and serum iron. Continue ferrous sulfate 325 mg by mouth twice a day with meals. We'll continue to monitor his iron levels. I recommended for him to take a stool softener should he get constipated due to the iron tablets.  Bone mets:  -Continue monthly Xgeva to help prevent skeletal related events.  -I will order a DEXA scan on his next visit to assess for osteoporosis.  Hypocalcemia:  -Currently taking 2000 mg  calcium with vitamin D.   Male left breast cancer:  -Mammogram in 03/2017 with reduced size of initial mass in left breast (originally 3 cm, now 1.1 cm). Since his last visit with me, I had our pathology team re-review his (R) humerus bone pathology from 2011. Staining was done which was ER+; there was not enough tissue to do HER2. However, ER+ staining supports metastatic breast cancer. -Genetics report dated 01/11/14: Variant of Unknown Significance, "[id number]."  -Reviewed with him that while metastatic breast cancer is not curable, it remains treatable.  Currently, his multiple myeloma is the more pressing issue in terms of treatment.    -Continue Tamoxifen.     Dispo:  -carfizolmib/cyclophosphamide/dexamethasone q28 days. Total of 2 cycles planned.  All questions were answered to patient's stated satisfaction. Encouraged patient to call with any new concerns or questions before his next visit to the cancer center and we can certain see him sooner, if needed.    Twana First, MD

## 2017-07-19 NOTE — Patient Instructions (Signed)
[facility] Discharge Instructions for Patients Receiving Chemotherapy   Beginning January 23rd 2017 lab work for the [facility] will be done in the  Main lab at [facility] on 1st floor. If you have a lab appointment with the [facility] please come in thru the  Main Entrance and check in at the main information desk   Today you received the following chemotherapy agents Cytoxan and Kyprolis. Follow-up as scheduled. Call clinic for any questions or concerns  To help prevent nausea and vomiting after your treatment, we encourage you to take your nausea medication   If you develop nausea and vomiting, or diarrhea that is not controlled by your medication, call the clinic.  The clinic phone number is [phone]. Office hours are Monday-Friday 8:30am-5:00pm.  BELOW ARE SYMPTOMS THAT SHOULD BE REPORTED IMMEDIATELY:  *FEVER GREATER THAN 101.0 F  *CHILLS WITH OR WITHOUT FEVER  NAUSEA AND VOMITING THAT IS NOT CONTROLLED WITH YOUR NAUSEA MEDICATION  *UNUSUAL SHORTNESS OF BREATH  *UNUSUAL BRUISING OR BLEEDING  TENDERNESS IN MOUTH AND THROAT WITH OR WITHOUT PRESENCE OF ULCERS  *URINARY PROBLEMS  *BOWEL PROBLEMS  UNUSUAL RASH Items with * indicate a potential emergency and should be followed up as soon as possible. If you have an emergency after office hours please contact your primary care physician or go to the nearest emergency department.  Please call the clinic during office hours if you have any questions or concerns.   You may also contact the Patient Navigator at [phone] should you have any questions or need assistance in obtaining follow up care.      Resources For Cancer Patients and their Caregivers ? American Cancer Society: Can assist with transportation, wigs, general needs, runs Look Good Feel Better.        743-448-9041 ? Cancer Care: Provides financial assistance, online support groups, medication/co-pay assistance.   1-800-813-HOPE 272 018 4705) ? [place] Assists [place] cancer patients and their families through emotional , educational and financial support.  (919) 302-3574 ? [place] DSS Where to apply for food stamps, Medicaid and utility assistance. 901-506-7171 ? RCATS: Transportation to medical appointments. 507-013-7739 ? Social Security Administration: May apply for disability if have a Stage IV cancer. 503-817-2891 615-280-5883 ? [employer], Disability and Transit Services: Assists with nutrition, care and transit needs. [phone]

## 2017-07-20 ENCOUNTER — Encounter (HOSPITAL_COMMUNITY): Payer: Self-pay

## 2017-07-20 ENCOUNTER — Ambulatory Visit (HOSPITAL_COMMUNITY): Payer: Medicare Other

## 2017-07-20 ENCOUNTER — Encounter (HOSPITAL_BASED_OUTPATIENT_CLINIC_OR_DEPARTMENT_OTHER): Payer: Medicare Other

## 2017-07-20 ENCOUNTER — Other Ambulatory Visit: Payer: Self-pay

## 2017-07-20 ENCOUNTER — Inpatient Hospital Stay (HOSPITAL_COMMUNITY): Payer: Medicare Other

## 2017-07-20 ENCOUNTER — Other Ambulatory Visit (HOSPITAL_COMMUNITY): Payer: Medicare Other

## 2017-07-20 VITALS — BP 117/61 | HR 63 | Temp 97.6°F | Resp 18

## 2017-07-20 DIAGNOSIS — C9000 Multiple myeloma not having achieved remission: Secondary | ICD-10-CM | POA: Diagnosis not present

## 2017-07-20 DIAGNOSIS — Z5112 Encounter for antineoplastic immunotherapy: Secondary | ICD-10-CM

## 2017-07-20 LAB — KAPPA/LAMBDA LIGHT CHAINS
KAPPA FREE LGHT CHN: 154.2 mg/L — AB (ref 3.3–19.4)
Kappa, lambda light chain ratio: 22.35 — ABNORMAL HIGH (ref 0.26–1.65)
Lambda free light chains: 6.9 mg/L (ref 5.7–26.3)

## 2017-07-20 LAB — BETA 2 MICROGLOBULIN, SERUM: BETA 2 MICROGLOBULIN: 2.2 mg/L (ref 0.6–2.4)

## 2017-07-20 MED ORDER — SODIUM CHLORIDE 0.9% FLUSH
3.0000 mL | INTRAVENOUS | Status: DC | PRN
  Administered 2017-07-20: 3 mL via INTRAVENOUS
  Filled 2017-07-20: qty 10

## 2017-07-20 MED ORDER — SODIUM CHLORIDE 0.9 % IV SOLN
Freq: Once | INTRAVENOUS | Status: AC
  Administered 2017-07-20: 11:00:00 via INTRAVENOUS

## 2017-07-20 MED ORDER — DEXTROSE 5 % IV SOLN
57.0000 mg/m2 | Freq: Once | INTRAVENOUS | Status: AC
  Administered 2017-07-20: 120 mg via INTRAVENOUS
  Filled 2017-07-20: qty 60

## 2017-07-20 MED ORDER — DEXAMETHASONE SODIUM PHOSPHATE 10 MG/ML IJ SOLN
10.0000 mg | Freq: Once | INTRAMUSCULAR | Status: AC
Start: 2017-07-20 — End: 2017-07-20
  Administered 2017-07-20: 10 mg via INTRAVENOUS
  Filled 2017-07-20: qty 1

## 2017-07-20 NOTE — Progress Notes (Signed)
Johnny Lucas tolerated Johnny Lucas infusion well. IV site from yesterday started to sting slightly si IV was restarted to complete the infusion.Warm compress applied to the site of the discontinued IV.No redness or swelling noted at site. VSS upon discharge. Pt discharged self ambulatory in satisfactory condition accompanied by his son

## 2017-07-20 NOTE — Patient Instructions (Signed)
[facility] Discharge Instructions for Patients Receiving Chemotherapy   Beginning January 23rd 2017 lab work for the Cancer Center will be done in the  Main lab at [facility] on 1st floor. If you have a lab appointment with the Cancer Center please come in thru the  Main Entrance and check in at the main information desk   Today you received the following chemotherapy agents Kyprolis. Follow-up as scheduled. Call clinic for any questions or concerns  To help prevent nausea and vomiting after your treatment, we encourage you to take your nausea medication   If you develop nausea and vomiting, or diarrhea that is not controlled by your medication, call the clinic.  The clinic phone number is [phone]. Office hours are Monday-Friday 8:30am-5:00pm.  BELOW ARE SYMPTOMS THAT SHOULD BE REPORTED IMMEDIATELY:  *FEVER GREATER THAN 101.0 F  *CHILLS WITH OR WITHOUT FEVER  NAUSEA AND VOMITING THAT IS NOT CONTROLLED WITH YOUR NAUSEA MEDICATION  *UNUSUAL SHORTNESS OF BREATH  *UNUSUAL BRUISING OR BLEEDING  TENDERNESS IN MOUTH AND THROAT WITH OR WITHOUT PRESENCE OF ULCERS  *URINARY PROBLEMS  *BOWEL PROBLEMS  UNUSUAL RASH Items with * indicate a potential emergency and should be followed up as soon as possible. If you have an emergency after office hours please contact your primary care physician or go to the nearest emergency department.  Please call the clinic during office hours if you have any questions or concerns.   You may also contact the Patient Navigator at [phone] should you have any questions or need assistance in obtaining follow up care.      Resources For Cancer Patients and their Caregivers ? American Cancer Society: Can assist with transportation, wigs, general needs, runs Look Good Feel Better.        1-888-227-6333 ? Cancer Care: Provides financial assistance, online support groups, medication/co-pay assistance.  1-800-813-HOPE  (4673) ? [facility] Assists [place] cancer patients and their families through emotional , educational and financial support.  336-427-4357 ? [place] DSS Where to apply for food stamps, Medicaid and utility assistance. 336-342-1394 ? [employer]: Transportation to medical appointments. 336-347-2287 ? Social Security Administration: May apply for disability if have a Stage IV cancer. 336-342-7796 1-800-772-1213 ? [place] Aging, Disability and Transit Services: Assists with nutrition, care and transit needs. [phone]         

## 2017-07-21 ENCOUNTER — Encounter (HOSPITAL_BASED_OUTPATIENT_CLINIC_OR_DEPARTMENT_OTHER): Payer: Medicare Other

## 2017-07-21 ENCOUNTER — Ambulatory Visit (HOSPITAL_COMMUNITY): Payer: Medicare Other

## 2017-07-21 ENCOUNTER — Other Ambulatory Visit (HOSPITAL_COMMUNITY): Payer: Self-pay | Admitting: Oncology

## 2017-07-21 VITALS — BP 143/77 | HR 69 | Temp 98.1°F | Resp 18

## 2017-07-21 DIAGNOSIS — C7951 Secondary malignant neoplasm of bone: Secondary | ICD-10-CM | POA: Diagnosis not present

## 2017-07-21 DIAGNOSIS — C50922 Malignant neoplasm of unspecified site of left male breast: Secondary | ICD-10-CM

## 2017-07-21 DIAGNOSIS — C50122 Malignant neoplasm of central portion of left male breast: Secondary | ICD-10-CM

## 2017-07-21 MED ORDER — DENOSUMAB 120 MG/1.7ML ~~LOC~~ SOLN
120.0000 mg | Freq: Once | SUBCUTANEOUS | Status: AC
  Administered 2017-07-21: 120 mg via SUBCUTANEOUS
  Filled 2017-07-21: qty 1.7

## 2017-07-21 NOTE — Progress Notes (Signed)
Johnny Lucas presents today for injection per the provider's orders.  Xgeva administration without incident; see MAR for injection details.  Patient tolerated procedure well and without incident.  No questions or complaints noted at this time.  Discharged ambulatory in c/o spouse.  

## 2017-07-22 LAB — MULTIPLE MYELOMA PANEL, SERUM
ALBUMIN/GLOB SERPL: 1.3 (ref 0.7–1.7)
Albumin SerPl Elph-Mcnc: 3.2 g/dL (ref 2.9–4.4)
Alpha 1: 0.3 g/dL (ref 0.0–0.4)
Alpha2 Glob SerPl Elph-Mcnc: 0.6 g/dL (ref 0.4–1.0)
B-Globulin SerPl Elph-Mcnc: 0.9 g/dL (ref 0.7–1.3)
Gamma Glob SerPl Elph-Mcnc: 0.7 g/dL (ref 0.4–1.8)
Globulin, Total: 2.5 g/dL (ref 2.2–3.9)
IGA: 23 mg/dL — AB (ref 61–437)
IGM, SERUM: 24 mg/dL (ref 20–172)
IgG (Immunoglobin G), Serum: 649 mg/dL — ABNORMAL LOW (ref 700–1600)
M Protein SerPl Elph-Mcnc: 0.6 g/dL — ABNORMAL HIGH
Total Protein ELP: 5.7 g/dL — ABNORMAL LOW (ref 6.0–8.5)

## 2017-07-26 ENCOUNTER — Encounter (HOSPITAL_COMMUNITY): Payer: Medicare Other

## 2017-07-26 ENCOUNTER — Encounter (HOSPITAL_BASED_OUTPATIENT_CLINIC_OR_DEPARTMENT_OTHER): Payer: Medicare Other

## 2017-07-26 ENCOUNTER — Encounter (HOSPITAL_COMMUNITY): Payer: Self-pay

## 2017-07-26 VITALS — BP 152/71 | HR 61 | Temp 98.1°F | Resp 18 | Wt 201.2 lb

## 2017-07-26 DIAGNOSIS — C9000 Multiple myeloma not having achieved remission: Secondary | ICD-10-CM

## 2017-07-26 DIAGNOSIS — Z5112 Encounter for antineoplastic immunotherapy: Secondary | ICD-10-CM | POA: Diagnosis present

## 2017-07-26 DIAGNOSIS — Z79899 Other long term (current) drug therapy: Secondary | ICD-10-CM | POA: Diagnosis not present

## 2017-07-26 DIAGNOSIS — C7951 Secondary malignant neoplasm of bone: Secondary | ICD-10-CM | POA: Diagnosis not present

## 2017-07-26 DIAGNOSIS — Z5111 Encounter for antineoplastic chemotherapy: Secondary | ICD-10-CM

## 2017-07-26 DIAGNOSIS — Z79810 Long term (current) use of selective estrogen receptor modulators (SERMs): Secondary | ICD-10-CM | POA: Diagnosis not present

## 2017-07-26 DIAGNOSIS — C50922 Malignant neoplasm of unspecified site of left male breast: Secondary | ICD-10-CM | POA: Diagnosis not present

## 2017-07-26 DIAGNOSIS — Z923 Personal history of irradiation: Secondary | ICD-10-CM | POA: Diagnosis not present

## 2017-07-26 LAB — CBC WITH DIFFERENTIAL/PLATELET
Basophils Absolute: 0 10*3/uL (ref 0.0–0.1)
Basophils Relative: 0 %
Eosinophils Absolute: 0.2 10*3/uL (ref 0.0–0.7)
Eosinophils Relative: 4 %
HCT: 25.8 % — ABNORMAL LOW (ref 39.0–52.0)
HEMOGLOBIN: 8.8 g/dL — AB (ref 13.0–17.0)
LYMPHS ABS: 0.5 10*3/uL — AB (ref 0.7–4.0)
LYMPHS PCT: 12 %
MCH: 33.3 pg (ref 26.0–34.0)
MCHC: 34.1 g/dL (ref 30.0–36.0)
MCV: 97.7 fL (ref 78.0–100.0)
Monocytes Absolute: 0.6 10*3/uL (ref 0.1–1.0)
Monocytes Relative: 16 %
NEUTROS ABS: 2.7 10*3/uL (ref 1.7–7.7)
NEUTROS PCT: 69 %
Platelets: 97 10*3/uL — ABNORMAL LOW (ref 150–400)
RBC: 2.64 MIL/uL — AB (ref 4.22–5.81)
RDW: 15.5 % (ref 11.5–15.5)
WBC: 3.9 10*3/uL — AB (ref 4.0–10.5)

## 2017-07-26 LAB — COMPREHENSIVE METABOLIC PANEL
ALK PHOS: 21 U/L — AB (ref 38–126)
ALT: 20 U/L (ref 17–63)
AST: 15 U/L (ref 15–41)
Albumin: 3.3 g/dL — ABNORMAL LOW (ref 3.5–5.0)
Anion gap: 5 (ref 5–15)
BUN: 13 mg/dL (ref 6–20)
CALCIUM: 8.4 mg/dL — AB (ref 8.9–10.3)
CO2: 24 mmol/L (ref 22–32)
CREATININE: 1.09 mg/dL (ref 0.61–1.24)
Chloride: 108 mmol/L (ref 101–111)
Glucose, Bld: 115 mg/dL — ABNORMAL HIGH (ref 65–99)
Potassium: 4.5 mmol/L (ref 3.5–5.1)
Sodium: 137 mmol/L (ref 135–145)
Total Bilirubin: 0.5 mg/dL (ref 0.3–1.2)
Total Protein: 5.5 g/dL — ABNORMAL LOW (ref 6.5–8.1)

## 2017-07-26 MED ORDER — DEXTROSE 5 % IV SOLN
57.0000 mg/m2 | Freq: Once | INTRAVENOUS | Status: DC
  Filled 2017-07-26: qty 60

## 2017-07-26 MED ORDER — PALONOSETRON HCL INJECTION 0.25 MG/5ML
0.2500 mg | Freq: Once | INTRAVENOUS | Status: AC
  Administered 2017-07-26: 0.25 mg via INTRAVENOUS
  Filled 2017-07-26: qty 5

## 2017-07-26 MED ORDER — SODIUM CHLORIDE 0.9 % IV SOLN
Freq: Once | INTRAVENOUS | Status: AC
  Administered 2017-07-26: 12:00:00 via INTRAVENOUS

## 2017-07-26 MED ORDER — CARFILZOMIB CHEMO INJECTION 60 MG
57.0000 mg/m2 | Freq: Once | INTRAVENOUS | Status: AC
  Administered 2017-07-26: 120 mg via INTRAVENOUS
  Filled 2017-07-26: qty 60

## 2017-07-26 MED ORDER — DEXAMETHASONE SODIUM PHOSPHATE 10 MG/ML IJ SOLN
10.0000 mg | Freq: Once | INTRAMUSCULAR | Status: AC
  Administered 2017-07-26: 10 mg via INTRAVENOUS
  Filled 2017-07-26: qty 1

## 2017-07-26 MED ORDER — CYCLOPHOSPHAMIDE CHEMO INJECTION 1 GM
300.0000 mg/m2 | Freq: Once | INTRAMUSCULAR | Status: AC
  Administered 2017-07-26: 640 mg via INTRAVENOUS
  Filled 2017-07-26: qty 32

## 2017-07-26 MED ORDER — DEXTROSE 5 % IV SOLN: 57.0000 mg/m2 | Freq: Once | INTRAVENOUS | Status: DC

## 2017-07-26 NOTE — Progress Notes (Signed)
Platelet count addressed with Dr. Talbert Cage per Rhett Bannister, RN.  Okay to tx today per MD.   Tolerated infusions w/o adverse reaction.  Alert, in no distress.  VSS.  Discharged ambulatory in c/o spouse.

## 2017-07-26 NOTE — Treatment Plan (Signed)
I spoke with Dr Talbert Cage about possibly reducing the Carfilzomib dose from 56 mg/m2. I had in mind 36 mg/m2 which is often used in the pre transplant setting with Cyclophosphamide.  Dr Talbert Cage wishes to maintain dose intensity at this time with the 56 mg/m2 as the patient is pre transplant and only has C1,d15,16 and Cycle 2 (all days) to complete.  She will consider an interval off of treatment to allow recovery as needed before Cycle 2.

## 2017-07-27 ENCOUNTER — Encounter (HOSPITAL_BASED_OUTPATIENT_CLINIC_OR_DEPARTMENT_OTHER): Payer: Medicare Other

## 2017-07-27 ENCOUNTER — Encounter (HOSPITAL_COMMUNITY): Payer: Self-pay

## 2017-07-27 VITALS — BP 140/78 | HR 66 | Temp 98.2°F | Resp 18

## 2017-07-27 DIAGNOSIS — Z5112 Encounter for antineoplastic immunotherapy: Secondary | ICD-10-CM

## 2017-07-27 DIAGNOSIS — C9000 Multiple myeloma not having achieved remission: Secondary | ICD-10-CM

## 2017-07-27 MED ORDER — DEXAMETHASONE SODIUM PHOSPHATE 10 MG/ML IJ SOLN
INTRAMUSCULAR | Status: AC
  Filled 2017-07-27: qty 1

## 2017-07-27 MED ORDER — SODIUM CHLORIDE 0.9 % IV SOLN: Freq: Once | INTRAVENOUS | Status: DC

## 2017-07-27 MED ORDER — DEXTROSE 5 % IV SOLN
57.0000 mg/m2 | Freq: Once | INTRAVENOUS | Status: AC
  Administered 2017-07-27: 120 mg via INTRAVENOUS
  Filled 2017-07-27: qty 60

## 2017-07-27 MED ORDER — SODIUM CHLORIDE 0.9% FLUSH
10.0000 mL | INTRAVENOUS | Status: DC | PRN
  Administered 2017-07-27: 10 mL
  Filled 2017-07-27: qty 10

## 2017-07-27 MED ORDER — DEXAMETHASONE SODIUM PHOSPHATE 10 MG/ML IJ SOLN
10.0000 mg | Freq: Once | INTRAMUSCULAR | Status: AC
  Administered 2017-07-27: 10 mg via INTRAVENOUS

## 2017-07-27 MED ORDER — SODIUM CHLORIDE 0.9 % IV SOLN
Freq: Once | INTRAVENOUS | Status: AC
  Administered 2017-07-27: 11:00:00 via INTRAVENOUS

## 2017-07-27 NOTE — Progress Notes (Signed)
Patient presents today for treatment per MD orders. Patient states that he has had a good day. He denies having any trouble since yesterdays treatment.  Patient tolerated infusion today without incidence. Patient discharged ambulatory and in stable condition from clinic with wife. Patient to follow up as scheduled.

## 2017-07-27 NOTE — Patient Instructions (Signed)
[facility] Discharge Instructions for Patients Receiving Chemotherapy   Beginning January 23rd 2017 lab work for the [facility] will be done in the  Main lab at [facility] on 1st floor. If you have a lab appointment with the [facility] please come in thru the  Main Entrance and check in at the main information desk   Today you received the following chemotherapy agents: kyprolis  To help prevent nausea and vomiting after your treatment, we encourage you to take your nausea medication as prescribed.   If you develop nausea and vomiting, or diarrhea that is not controlled by your medication, call the clinic.  The clinic phone number is [phone]. Office hours are Monday-Friday 8:30am-5:00pm.  BELOW ARE SYMPTOMS THAT SHOULD BE REPORTED IMMEDIATELY:  *FEVER GREATER THAN 101.0 F  *CHILLS WITH OR WITHOUT FEVER  NAUSEA AND VOMITING THAT IS NOT CONTROLLED WITH YOUR NAUSEA MEDICATION  *UNUSUAL SHORTNESS OF BREATH  *UNUSUAL BRUISING OR BLEEDING  TENDERNESS IN MOUTH AND THROAT WITH OR WITHOUT PRESENCE OF ULCERS  *URINARY PROBLEMS  *BOWEL PROBLEMS  UNUSUAL RASH Items with * indicate a potential emergency and should be followed up as soon as possible. If you have an emergency after office hours please contact your primary care physician or go to the nearest emergency department.  Please call the clinic during office hours if you have any questions or concerns.   You may also contact the Patient Navigator at [phone] should you have any questions or need assistance in obtaining follow up care.      Resources For Cancer Patients and their Caregivers ? American Cancer Society: Can assist with transportation, wigs, general needs, runs Look Good Feel Better.        [phone] ? Cancer Care: Provides financial assistance, online support groups, medication/co-pay assistance.  1-800-813-HOPE 609-694-4440) ? [place] Assists  [place] cancer patients and their families through emotional , educational and financial support.  (250) 038-5856 ? [place] DSS Where to apply for food stamps, Medicaid and utility assistance. 762-366-7440 ? [employer]: Transportation to medical appointments. 629-526-0097 ? Social Security Administration: May apply for disability if have a Stage IV cancer. [phone] [phone] ? [employer], Disability and Transit Services: Assists with nutrition, care and transit needs. [phone]

## 2017-08-02 ENCOUNTER — Telehealth (HOSPITAL_COMMUNITY): Payer: Self-pay | Admitting: Emergency Medicine

## 2017-08-02 ENCOUNTER — Other Ambulatory Visit (HOSPITAL_COMMUNITY): Payer: Self-pay | Admitting: Emergency Medicine

## 2017-08-02 MED ORDER — GABAPENTIN 300 MG PO CAPS: ORAL_CAPSULE | ORAL | 2 refills | Status: DC

## 2017-08-02 NOTE — Progress Notes (Signed)
Pt called and stated that he was having really bad neuropathy in his legs and feet.  Sometimes it is getting hard for him to walk.  He is taking alieve and that helps some.  Spoke with Dr Talbert Cage and she order Gabapentin 300 mg twice a day for a few weeks, then increase to 3 times a day.  He can continue to take alieve also.  He verbalized understanding.  Explained to be careful when he started to take this drug could make him sleepy.

## 2017-08-02 NOTE — Telephone Encounter (Signed)
Error

## 2017-08-09 ENCOUNTER — Encounter (HOSPITAL_COMMUNITY): Payer: Medicare Other | Attending: Oncology

## 2017-08-09 ENCOUNTER — Encounter (HOSPITAL_COMMUNITY): Payer: Medicare Other

## 2017-08-09 ENCOUNTER — Other Ambulatory Visit (HOSPITAL_COMMUNITY): Payer: Self-pay | Admitting: Oncology

## 2017-08-09 VITALS — BP 138/67 | HR 66 | Temp 97.7°F | Resp 18 | Wt 197.0 lb

## 2017-08-09 DIAGNOSIS — C9000 Multiple myeloma not having achieved remission: Secondary | ICD-10-CM

## 2017-08-09 DIAGNOSIS — Z5112 Encounter for antineoplastic immunotherapy: Secondary | ICD-10-CM | POA: Diagnosis not present

## 2017-08-09 DIAGNOSIS — Z5111 Encounter for antineoplastic chemotherapy: Secondary | ICD-10-CM

## 2017-08-09 LAB — COMPREHENSIVE METABOLIC PANEL
ALBUMIN: 3.6 g/dL (ref 3.5–5.0)
ALK PHOS: 24 U/L — AB (ref 38–126)
ALT: 23 U/L (ref 17–63)
AST: 15 U/L (ref 15–41)
Anion gap: 8 (ref 5–15)
BUN: 14 mg/dL (ref 6–20)
CALCIUM: 8.9 mg/dL (ref 8.9–10.3)
CO2: 26 mmol/L (ref 22–32)
CREATININE: 1.2 mg/dL (ref 0.61–1.24)
Chloride: 104 mmol/L (ref 101–111)
GFR calc Af Amer: >60 mL/min (ref >60)
GFR calc non Af Amer: 60 mL/min — ABNORMAL LOW (ref >60)
GLUCOSE: 134 mg/dL — AB (ref 65–99)
Potassium: 3.7 mmol/L (ref 3.5–5.1)
SODIUM: 138 mmol/L (ref 135–145)
Total Bilirubin: 0.7 mg/dL (ref 0.3–1.2)
Total Protein: 5.9 g/dL — ABNORMAL LOW (ref 6.5–8.1)

## 2017-08-09 LAB — CBC WITH DIFFERENTIAL/PLATELET
BASOS PCT: 1 %
Basophils Absolute: 0 10*3/uL (ref 0.0–0.1)
EOS ABS: 0.1 10*3/uL (ref 0.0–0.7)
Eosinophils Relative: 3 %
HCT: 27.9 % — ABNORMAL LOW (ref 39.0–52.0)
Hemoglobin: 9.3 g/dL — ABNORMAL LOW (ref 13.0–17.0)
LYMPHS PCT: 12 %
Lymphs Abs: 0.5 10*3/uL — ABNORMAL LOW (ref 0.7–4.0)
MCH: 32.9 pg (ref 26.0–34.0)
MCHC: 33.3 g/dL (ref 30.0–36.0)
MCV: 98.6 fL (ref 78.0–100.0)
Monocytes Absolute: 0.3 10*3/uL (ref 0.1–1.0)
Monocytes Relative: 7 %
Neutro Abs: 2.9 10*3/uL (ref 1.7–7.7)
Neutrophils Relative %: 77 %
Platelets: 198 10*3/uL (ref 150–400)
RBC: 2.83 MIL/uL — AB (ref 4.22–5.81)
RDW: 15 % (ref 11.5–15.5)
WBC: 3.8 10*3/uL — ABNORMAL LOW (ref 4.0–10.5)

## 2017-08-09 MED ORDER — PALONOSETRON HCL INJECTION 0.25 MG/5ML
0.2500 mg | Freq: Once | INTRAVENOUS | Status: AC
  Administered 2017-08-09: 0.25 mg via INTRAVENOUS

## 2017-08-09 MED ORDER — DEXAMETHASONE SODIUM PHOSPHATE 10 MG/ML IJ SOLN
10.0000 mg | Freq: Once | INTRAMUSCULAR | Status: AC
  Administered 2017-08-09: 10 mg via INTRAVENOUS

## 2017-08-09 MED ORDER — CARFILZOMIB CHEMO INJECTION 60 MG
57.0000 mg/m2 | Freq: Once | INTRAVENOUS | Status: AC
  Administered 2017-08-09: 120 mg via INTRAVENOUS
  Filled 2017-08-09: qty 60

## 2017-08-09 MED ORDER — SODIUM CHLORIDE 0.9% FLUSH: 10.0000 mL | INTRAVENOUS | Status: DC | PRN

## 2017-08-09 MED ORDER — HEPARIN SOD (PORK) LOCK FLUSH 100 UNIT/ML IV SOLN: 500.0000 [IU] | Freq: Once | INTRAVENOUS | Status: DC | PRN

## 2017-08-09 MED ORDER — SODIUM CHLORIDE 0.9 % IV SOLN
Freq: Once | INTRAVENOUS | Status: AC
  Administered 2017-08-09: 13:00:00 via INTRAVENOUS

## 2017-08-09 MED ORDER — SODIUM CHLORIDE 0.9% FLUSH
3.0000 mL | INTRAVENOUS | Status: DC | PRN
  Administered 2017-08-09: 3 mL via INTRAVENOUS
  Filled 2017-08-09: qty 10

## 2017-08-09 MED ORDER — PALONOSETRON HCL INJECTION 0.25 MG/5ML
INTRAVENOUS | Status: AC
  Filled 2017-08-09: qty 5

## 2017-08-09 MED ORDER — SODIUM CHLORIDE 0.9 % IV SOLN
300.0000 mg/m2 | Freq: Once | INTRAVENOUS | Status: AC
  Administered 2017-08-09: 640 mg via INTRAVENOUS
  Filled 2017-08-09: qty 32

## 2017-08-09 MED ORDER — SODIUM CHLORIDE 0.9 % IV SOLN
Freq: Once | INTRAVENOUS | Status: AC
  Administered 2017-08-09: 12:00:00 via INTRAVENOUS

## 2017-08-09 MED ORDER — DEXAMETHASONE SODIUM PHOSPHATE 10 MG/ML IJ SOLN
INTRAMUSCULAR | Status: AC
  Filled 2017-08-09: qty 1

## 2017-08-09 NOTE — Progress Notes (Signed)
Johnny Lucas tolerated chemo tx well without complaints or incident.Labs reviewed prior to administering chemotherapy. VSS upon discharge. Pt discharged self ambulatory in satisfactory condition accompanied by his wife

## 2017-08-09 NOTE — Progress Notes (Signed)
[address] [place], [place] [address]   CLINIC:  Medical Oncology/Hematology  PCP:  [name], MD [place] [address] [phone]   REASON FOR VISIT:  Follow-up for Stage II multiple myeloma AND Stage IV breast cancer with bone mets   CURRENT THERAPY: Carfilzomib/Cyclophosphamide/Decadron (as recommended by Dr. Norma Fredrickson at [facility]) AND Tamoxifen daily    BRIEF ONCOLOGIC HISTORY:    Breast cancer, male ([place])   12/31/2009 Initial Biopsy    Biopsy of L breast       12/31/2009 Pathology Results    Invasive ductal carcinoma, ER/PR+, HER 2 negative      12/31/2009 Imaging    Ultrasound showing a 2.43 x 1.85 x 3 cm hypoechoic spiculated mass in the 12 o clock L breast retroareolar region      01/01/2010 -  Anti-estrogen oral therapy    Tamoxifen 20 mg daily      01/06/2010 Imaging    Bone scan abnormal uptake in the diaphysis of the R humerus, abnormal in the R third, fifth and sixth ribs, lesion also noted in the sternum.      02/03/2010 Surgery    Rod placement and fixation of R humerus by Dr. [name]      02/05/2010 - 02/18/2010 Radiation Therapy    30Gy in 10 fractions of 3 Gy per fraction to R pathologic fracture      03/11/2010 -  Chemotherapy    Denosumab monthly, now every 3 months. Started at [facility]       06/09/2016 Imaging    Three hypermetabolic osseous lesions in the sternum, left ilium and right ilium, as discussed above, likely represent osseous metastases. At this time, these are not recognizable on the CT images. 2. No extra skeletal metastatic disease identified in the neck, chest, abdomen or pelvis.      10/13/2016 Progression    PET shows various new and enlarging osseous metastatic lesions with no definite extra osseous metastatic disease currently identified.       12/31/2016 Progression    1. Multifocal hypermetabolic osseous metastases throughout the axial and proximal appendicular skeleton, which are  increased in size, number and metabolism since 10/13/2016 PET-CT. 2. 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 3. No additional sites of hypermetabolic metastatic disease. 4. Chronic right mastoid sinusitis. 5. Aortic atherosclerosis.  One vessel coronary atherosclerosis.       Multiple myeloma ([place])   02/12/2017 Bone Marrow Biopsy    The marrow was variably cellular with large peritrabecular aggregates of kappa restricted plasma cells (66% by aspirate, 30% by Cd138). Cytogenetics +11.       03/01/2017 - 06/29/2017 Chemotherapy    RVD       05/26/2017 Bone Marrow Biopsy    Performed at [facility]:  Plasma cell myeloma in a 30% cellularmarrow with decreased trilineage hematopoiesis and 42% kappalight chain restricted plasma cells on the aspirate smears andlarge aggregates on the core biopsy.       07/12/2017 -  Chemotherapy    Cycle 1 of carfizolmib/cyclophosphamide/dexamethasone          HISTORY OF PRESENT ILLNESS:  (From Dr. Laverle Patter note on 02/18/17)     INTERVAL HISTORY:  Mr. Chir[place]oga 71 y.o. male here for routine follow-up for multiple myeloma and Stage IV metastatic breast cancer.   He is here today for day 2, cycle #2 of  Carfilzomib/Cyclophosphamide.   Reports that he has been tolerating new chemo regimen "pretty well."  Appetite and energy levels 50%.  Remains on Tamoxifen with good tolerance.  Reports occasional fatigue; he struggles with constipation at times.  He is taking gabapentin 300 mg BID for peripheral neuropathy which is helping.  He does have feet and leg pain, which can be severe at times, particularly after chemo.  States that he usually takes Aleve, "but last week my legs hurt so bad that I couldn't get any relief."  He is requesting a possible prescription for pain; states "I hope I won't ever need it, but I don't want to hurt like that  again and not have anything to take for it."    Remains on calcium and vitamin D supplementation. Continues to receive Xgeva injection monthly. (of note, doses have been held in the past for hypocalcemia).    Otherwise, he is largely without complaints today and feels ready for next chemo.    REVIEW OF SYSTEMS:  Review of Systems  Constitutional: Positive for fatigue. Negative for chills and fever.  HENT:  Negative.  Negative for lump/mass and nosebleeds.   Eyes: Negative.   Respiratory: Negative.  Negative for cough and shortness of breath.   Cardiovascular: Negative.  Negative for chest pain and leg swelling.  Gastrointestinal: Positive for constipation. Negative for abdominal pain, blood in stool, diarrhea, nausea and vomiting.  Endocrine: Negative.   Genitourinary: Negative.  Negative for dysuria and hematuria.   Musculoskeletal: Negative.  Negative for arthralgias.  Skin: Negative.  Negative for rash.  Neurological: Positive for numbness. Negative for dizziness and headaches.  Hematological: Negative.  Negative for adenopathy. Does not bruise/bleed easily.  Psychiatric/Behavioral: Negative.  Negative for depression and sleep disturbance. The patient is not nervous/anxious.      PAST MEDICAL/SURGICAL HISTORY:  Past Medical History:  Diagnosis Date  . Anxiety   . Bone metastases ([name] 09/10/2016  . Breast cancer ([name] 2011   Stave IV breast cancer; radiation and tamoxifen  . Breast cancer, male ([name]    Stave IV breast cancer; radiation and tamoxifen  Overview:  Left breast ca with mets bone  Overview:  METS TO BONE  . GERD (gastroesophageal reflux disease)   . Hypertension   . Macular degeneration   . Multiple myeloma ([name] 02/18/2017   Past Surgical History:  Procedure Laterality Date  . BACK SURGERY    . HERNIA REPAIR    . right arm surgery       SOCIAL HISTORY:  Social History   Social History  . Marital status: Married    Spouse name: N/A  . Number of  children: 3  . Years of education: N/A   Occupational History  . Not on file.   Social History Main Topics  . Smoking status: Never Smoker  . Smokeless tobacco: Former Neurosurgeon  . Alcohol use 14.4 oz/week    24 Cans of beer per week  . Drug use: No  . Sexual activity: Not on file   Other Topics Concern  . Not on file   Social History Narrative  . No narrative on file    FAMILY HISTORY:  Family History  Problem Relation Age of Onset  . Stroke Mother   . Cancer Maternal Aunt        cancer NOS; died in her 35s  . Lung cancer Maternal Uncle        smoker    CURRENT MEDICATIONS:  Outpatient Encounter Prescriptions as of  08/10/2017  Medication Sig Note  . acyclovir (ZOVIRAX) 400 MG tablet Take 1 tablet (400 mg total) by mouth daily.   Marland Kitchen albuterol (PROVENTIL HFA;VENTOLIN HFA) 108 (90 Base) MCG/ACT inhaler Inhale 2 puffs into the lungs every 6 (six) hours as needed for wheezing or shortness of breath.   . ALPRAZolam (XANAX) 0.5 MG tablet TAKE ONE TABLET BY MOUTH AT BEDTIME AS NEEDED FOR ANXIETY   . aspirin EC 81 MG tablet Take 162 mg by mouth daily.  09/10/2016: Received from: [facility]: Take 162 mg by mouth daily.  . Calcium Carb-Cholecalciferol (CALCIUM 500 + D3) 500-600 MG-UNIT TABS Take 2 tablets by mouth daily.   . calcium carbonate (TUMS - DOSED IN MG ELEMENTAL CALCIUM) 500 MG chewable tablet Chew 3 tablets by mouth daily.    . calcium-vitamin D (OSCAL WITH D) 500-200 MG-UNIT TABS tablet Take 2 tablets by mouth daily.   . Carfilzomib (KYPROLIS IV) Inject into the vein. Day 1 and 2, day 8 and 9, day 15 and 16 every 28 days   . CYCLOPHOSPHAMIDE IV Inject into the vein. Days 1,8,15 every 28 days   . denosumab (XGEVA) 120 MG/1.7ML SOLN injection Inject 120 mg into the skin once. Every 28 days   . dexamethasone (DECADRON) 4 MG tablet Take 10 tablets ('40mg'$ ) on days 1,8,15, and 22 of chemo. Repeat every 28 days.   . ferrous sulfate 325 (65 FE) MG EC tablet Take 1 tablet  (325 mg total) by mouth 2 (two) times daily after a meal.   . gabapentin (NEURONTIN) 300 MG capsule Take two times a day, then increase to three times a day in a few weeks.   Marland Kitchen loratadine (CLARITIN) 10 MG tablet Take 1 tablet (10 mg total) by mouth daily. (Patient taking differently: Take 10 mg by mouth daily. Takes as needed.)   . metoprolol succinate (TOPROL-XL) 100 MG 24 hr tablet Take 100 mg by mouth daily.  01/04/2017: Received from: External Pharmacy  . Multiple Vitamins-Minerals (OCUVITE PO) Take by mouth daily.  09/10/2016: Received from: [facility]: Take 1 tablet by mouth daily.  . ondansetron (ZOFRAN) 8 MG tablet Take 1 tablet (8 mg total) by mouth every 8 (eight) hours as needed for nausea or vomiting.   . pantoprazole (PROTONIX) 40 MG tablet Take 40 mg by mouth daily. 03/29/2017: Takes prn  . polyethylene glycol (MIRALAX / GLYCOLAX) packet Take 17 g by mouth daily as needed.   . prochlorperazine (COMPAZINE) 10 MG tablet Take 1 tablet (10 mg total) by mouth every 6 (six) hours as needed for nausea or vomiting.   . sildenafil (REVATIO) 20 MG tablet TAKE UP TO FIVE TABLETS BY MOUTH EVERY DAY AS NEEDED   . tamoxifen (NOLVADEX) 20 MG tablet Take 1 tablet (20 mg total) by mouth daily.   . [DISCONTINUED] heparin lock flush 100 unit/mL    . [DISCONTINUED] sodium chloride flush (NS) 0.9 % injection 10 mL    . [DISCONTINUED] sodium chloride flush (NS) 0.9 % injection 3 mL     No facility-administered encounter medications on file as of 08/10/2017.     ALLERGIES:  No Known Allergies   PHYSICAL EXAM:  ECOG Performance status: 1 - Symptomatic, but largely independent.      Physical Exam  Constitutional: He is oriented to person, place, and time and well-developed, well-nourished, and in no distress.  Seen in chemo chair in infusion area   HENT:  Head: Normocephalic.  Mouth/Throat: Oropharynx is clear  and moist.  Eyes: Conjunctivae are normal. No scleral icterus.  Neck:  Normal range of motion. Neck supple.  Cardiovascular: Normal rate and regular rhythm.   Pulmonary/Chest: Effort normal and breath sounds normal. No respiratory distress.  Abdominal: Soft. Bowel sounds are normal. There is no tenderness.  Musculoskeletal: Normal range of motion. He exhibits no edema.  Lymphadenopathy:    He has no cervical adenopathy.       Right: No supraclavicular adenopathy present.       Left: No supraclavicular adenopathy present.  Neurological: He is alert and oriented to person, place, and time. No cranial nerve deficit.  Skin: Skin is warm and dry. No rash noted.  Psychiatric: Mood, memory, affect and judgment normal.  Nursing note and vitals reviewed.    LABORATORY DATA:  I have reviewed the labs as listed.  CBC    Component Value Date/Time   WBC 3.8 (L) 08/09/2017 1042   RBC 2.83 (L) 08/09/2017 1042   HGB 9.3 (L) 08/09/2017 1042   HCT 27.9 (L) 08/09/2017 1042   PLT 198 08/09/2017 1042   MCV 98.6 08/09/2017 1042   MCH 32.9 08/09/2017 1042   MCHC 33.3 08/09/2017 1042   RDW 15.0 08/09/2017 1042   LYMPHSABS 0.5 (L) 08/09/2017 1042   MONOABS 0.3 08/09/2017 1042   EOSABS 0.1 08/09/2017 1042   BASOSABS 0.0 08/09/2017 1042   CMP Latest Ref Rng & Units 08/09/2017 07/26/2017 07/19/2017  Glucose 65 - 99 mg/dL 134(H) 115(H) 105(H)  BUN 6 - 20 mg/dL '14 13 17  '$ Creatinine 0.61 - 1.24 mg/dL 1.20 1.09 1.33(H)  Sodium 135 - 145 mmol/L 138 137 136  Potassium 3.5 - 5.1 mmol/L 3.7 4.5 4.1  Chloride 101 - 111 mmol/L 104 108 103  CO2 22 - 32 mmol/L '26 24 28  '$ Calcium 8.9 - 10.3 mg/dL 8.9 8.4(L) 9.0  Total Protein 6.5 - 8.1 g/dL 5.9(L) 5.5(L) 6.0(L)  Total Bilirubin 0.3 - 1.2 mg/dL 0.7 0.5 0.8  Alkaline Phos 38 - 126 U/L 24(L) 21(L) 24(L)  AST 15 - 41 U/L '15 15 16  '$ ALT 17 - 63 U/L 23 20 15(L)    PENDING LABS:    DIAGNOSTIC IMAGING:  *Images and radiologic reports reviewed independently and agree with finds as listed below.  PET scan: 12/31/16      Mammogram  04/06/17       PATHOLOGY:  (R) humerus bone path: 01/21/10 (re-evaluated on 04/09/17)     Genetics report: 01/22/14    Bone marrow biopsy: 02/02/17   Cytogenetics report: 02/10/17       ASSESSMENT & PLAN:   IgG kappa multiple myeloma:  -SPEP in 12/2016 revealed M-spike 2.3% with elevated IgG monoclonal protein (3,255 mg/dL) and elevated kappa light chains (2,129.1 mg/L) & kappa/lambda light chain ratio (272.96).  -Bone marrow biopsy on 02/02/17 demonstrated multiple myeloma; evidence of large peritravecular aggregates with kappa restricted plasma cells (66% by aspirate, 30% by Cd138). Cytogenetics +11.   -Was previously on Revlimid/Velcade/Decadron from 03/01/17-06/29/17.  SPEP with good response to therapy with decrease seen in M-spike, kappa light chain, and kappa/lambda light chain ratio. -Patient saw Dr. Norma Fredrickson at [facility] for evaluation for stem cell transplant.  He recommended the following, in order to attempt to achieve a deeper response to treatment:   -2 cycles of Carfilzomib/Cyclophosphamide/Decadron   *After 2 cycles, repeat myeloma labs.  If light chain ratio improves, then proceed with repeat bone marrow biopsy.     *After 2 cycles,  obtain PET scan to assess for FDG avid lesions; also obtain DEXA scan to assess for osteoporosis.    -s/p cycle #1 of Carfilzomib/Cyclophosphamide/Decadron on 07/12/17. He tolerated very well.  -Today is day 2, cycle #2 of current treatment. Labs reviewed and adequate for treatment today.  -He will complete cycle #2 of treatment on 08/24/17.  Per patient, he has bone marrow biopsy scheduled for 08/17/17 at [facility]; he then has follow-up visit with Dr. Norma Fredrickson on 08/23/17.  Based on his labs from cycle #1, I think his platelet and WBC counts will be safe for bone marrow biopsy at [facility] as scheduled.   -Will await recommendations from Dr. Norma Fredrickson if we need to proceed with ordering PET scan and DEXA scan based on bone  marrow biopsy results.  -Return to cancer center to follow-up with Dr. Talbert Cage after cycle #2 to discuss additional treatment plans.      Constipation:  -Recommended he try Miralax OTC once daily with stool softeners; may increase to BID if ineffective.   Pain:  -Likely secondary to chemo, pre-existing arthritis, and malignancy.  -He does not desire "anything too strong" for pain management. Discussed use to Tramadol PRN for pain. He agreed to give this a try when needed.  Will give prescription for Tramadol 50 mg; he can take 0.5-1 tab PRN. Encouraged him to use caution when first taking opiates to monitor for drowsiness and increased risk of falls. He voiced understanding.  -Encouraged him to increase bowel regimen when taking pain meds to help with opiate-induced constipation.   Bone mets:  -Continue monthly Xgeva to help prevent skeletal related events. Last dose given 07/21/17.  Oncology Flowsheet 07/21/2017  denosumab (XGEVA)  120 mg    Male left breast cancer:  -Mammogram in 03/2017 with reduced size of initial mass in left breast (originally 3 cm, now 1.1 cm). Since his last visit with me, I had our pathology team re-review his (R) humerus bone pathology from 2011. Staining was done which was ER+; there was not enough tissue to do HER2. However, ER+ staining supports metastatic breast cancer. -Genetics report dated 01/11/14: Variant of Unknown Significance, "[id number]."  -He understands that while metastatic breast cancer is not curable, it remains treatable.  Currently, his multiple myeloma is the more pressing issue in terms of treatment.  -Continue Tamoxifen.     Dispo:  -Return to cancer center for follow-up after cycle #2 to see Dr. Talbert Cage for additional treatment planning.   All questions were answered to patient's stated satisfaction. Encouraged patient to call with any new concerns or questions before his next visit to the cancer center and we can certain see him sooner, if  needed.    A total of 30 minutes was spent in face-to-face care of this patient, with greater than 50% of that time spent in counseling and care-coordination.    Orders placed this encounter:  No orders of the defined types were placed in this encounter.     Mike Craze, NP [place] [phone]

## 2017-08-09 NOTE — Patient Instructions (Signed)
[facility] Discharge Instructions for Patients Receiving Chemotherapy   Beginning January 23rd 2017 lab work for the [facility] will be done in the  Main lab at [facility] on 1st floor. If you have a lab appointment with the [facility] please come in thru the  Main Entrance and check in at the main information desk   Today you received the following chemotherapy agents Cytoxan and Kyprolis. Follow-up as scheduled. Call clinic for any questions or concerns  To help prevent nausea and vomiting after your treatment, we encourage you to take your nausea medication   If you develop nausea and vomiting, or diarrhea that is not controlled by your medication, call the clinic.  The clinic phone number is [phone]. Office hours are Monday-Friday 8:30am-5:00pm.  BELOW ARE SYMPTOMS THAT SHOULD BE REPORTED IMMEDIATELY:  *FEVER GREATER THAN 101.0 F  *CHILLS WITH OR WITHOUT FEVER  NAUSEA AND VOMITING THAT IS NOT CONTROLLED WITH YOUR NAUSEA MEDICATION  *UNUSUAL SHORTNESS OF BREATH  *UNUSUAL BRUISING OR BLEEDING  TENDERNESS IN MOUTH AND THROAT WITH OR WITHOUT PRESENCE OF ULCERS  *URINARY PROBLEMS  *BOWEL PROBLEMS  UNUSUAL RASH Items with * indicate a potential emergency and should be followed up as soon as possible. If you have an emergency after office hours please contact your primary care physician or go to the nearest emergency department.  Please call the clinic during office hours if you have any questions or concerns.   You may also contact the Patient Navigator at [phone] should you have any questions or need assistance in obtaining follow up care.      Resources For Cancer Patients and their Caregivers ? American Cancer Society: Can assist with transportation, wigs, general needs, runs Look Good Feel Better.        808 081 4195 ? Cancer Care: Provides financial assistance, online support groups, medication/co-pay assistance.   1-800-813-HOPE 504-359-7016) ? [facility] Assists [place] cancer patients and their families through emotional , educational and financial support.  (548)345-3703 ? [place] DSS Where to apply for food stamps, Medicaid and utility assistance. 669-667-6394 ? [employer]ATS: Transportation to medical appointments. (304)254-6337 ? Social Security Administration: May apply for disability if have a Stage IV cancer. 502-429-6938 239-690-5208 ? [employer], Disability and Transit Services: Assists with nutrition, care and transit needs. [phone]

## 2017-08-10 ENCOUNTER — Encounter (HOSPITAL_COMMUNITY): Payer: Self-pay | Admitting: Adult Health

## 2017-08-10 ENCOUNTER — Encounter (HOSPITAL_BASED_OUTPATIENT_CLINIC_OR_DEPARTMENT_OTHER): Payer: Medicare Other | Admitting: Adult Health

## 2017-08-10 ENCOUNTER — Encounter (HOSPITAL_BASED_OUTPATIENT_CLINIC_OR_DEPARTMENT_OTHER): Payer: Medicare Other

## 2017-08-10 VITALS — BP 146/77 | HR 68 | Temp 97.9°F | Resp 18 | Wt 197.0 lb

## 2017-08-10 DIAGNOSIS — Z5112 Encounter for antineoplastic immunotherapy: Secondary | ICD-10-CM | POA: Diagnosis not present

## 2017-08-10 DIAGNOSIS — Z170 Estrogen receptor positive status [ER+]: Secondary | ICD-10-CM

## 2017-08-10 DIAGNOSIS — K5900 Constipation, unspecified: Secondary | ICD-10-CM

## 2017-08-10 DIAGNOSIS — C9000 Multiple myeloma not having achieved remission: Secondary | ICD-10-CM

## 2017-08-10 DIAGNOSIS — C7951 Secondary malignant neoplasm of bone: Secondary | ICD-10-CM | POA: Diagnosis not present

## 2017-08-10 DIAGNOSIS — R52 Pain, unspecified: Secondary | ICD-10-CM | POA: Diagnosis not present

## 2017-08-10 DIAGNOSIS — C50122 Malignant neoplasm of central portion of left male breast: Secondary | ICD-10-CM | POA: Diagnosis not present

## 2017-08-10 DIAGNOSIS — C50922 Malignant neoplasm of unspecified site of left male breast: Secondary | ICD-10-CM

## 2017-08-10 MED ORDER — DEXAMETHASONE SODIUM PHOSPHATE 10 MG/ML IJ SOLN
10.0000 mg | Freq: Once | INTRAMUSCULAR | Status: AC
  Administered 2017-08-10: 10 mg via INTRAVENOUS

## 2017-08-10 MED ORDER — SODIUM CHLORIDE 0.9 % IV SOLN
Freq: Once | INTRAVENOUS | Status: AC
  Administered 2017-08-10: 13:00:00 via INTRAVENOUS

## 2017-08-10 MED ORDER — CARFILZOMIB CHEMO INJECTION 60 MG
57.0000 mg/m2 | Freq: Once | INTRAVENOUS | Status: AC
  Administered 2017-08-10: 120 mg via INTRAVENOUS
  Filled 2017-08-10: qty 60

## 2017-08-10 MED ORDER — SODIUM CHLORIDE 0.9 % IV SOLN
Freq: Once | INTRAVENOUS | Status: AC
  Administered 2017-08-10: 12:00:00 via INTRAVENOUS

## 2017-08-10 MED ORDER — DEXAMETHASONE SODIUM PHOSPHATE 10 MG/ML IJ SOLN
INTRAMUSCULAR | Status: AC
  Filled 2017-08-10: qty 1

## 2017-08-10 NOTE — Patient Instructions (Signed)
[facility] Discharge Instructions for Patients Receiving Chemotherapy   Beginning January 23rd 2017 lab work for the [facility] will be done in the  Main lab at [facility] on 1st floor. If you have a lab appointment with the [facility] please come in thru the  Main Entrance and check in at the main information desk   Today you received the following chemotherapy agents   To help prevent nausea and vomiting after your treatment, we encourage you to take your nausea medications   If you develop nausea and vomiting, or diarrhea that is not controlled by your medication, call the clinic.  The clinic phone number is [phone]. Office hours are Monday-Friday 8:30am-5:00pm.  BELOW ARE SYMPTOMS THAT SHOULD BE REPORTED IMMEDIATELY:  *FEVER GREATER THAN 101.0 F  *CHILLS WITH OR WITHOUT FEVER  NAUSEA AND VOMITING THAT IS NOT CONTROLLED WITH YOUR NAUSEA MEDICATION  *UNUSUAL SHORTNESS OF BREATH  *UNUSUAL BRUISING OR BLEEDING  TENDERNESS IN MOUTH AND THROAT WITH OR WITHOUT PRESENCE OF ULCERS  *URINARY PROBLEMS  *BOWEL PROBLEMS  UNUSUAL RASH Items with * indicate a potential emergency and should be followed up as soon as possible. If you have an emergency after office hours please contact your primary care physician or go to the nearest emergency department.  Please call the clinic during office hours if you have any questions or concerns.   You may also contact the Patient Navigator at [phone] should you have any questions or need assistance in obtaining follow up care.      Resources For Cancer Patients and their Caregivers ? American Cancer Society: Can assist with transportation, wigs, general needs, runs Look Good Feel Better.        360-244-4949 ? Cancer Care: Provides financial assistance, online support groups, medication/co-pay assistance.  1-800-813-HOPE (260)705-8259) ? [facility] Assists [place] cancer  patients and their families through emotional , educational and financial support.  236-527-4337 ? [place] DSS Where to apply for food stamps, Medicaid and utility assistance. (706) 728-5925 ? RCATS: Transportation to medical appointments. 4093561237 ? Social Security Administration: May apply for disability if have a Stage IV cancer. 224-276-1723 978-290-9535 ? [employer], Disability and Transit Services: Assists with nutrition, care and transit needs. 605-587-3659

## 2017-08-10 NOTE — Patient Instructions (Signed)
[facility] Discharge Instructions  RECOMMENDATIONS MADE BY THE CONSULTANT AND ANY TEST RESULTS WILL BE SENT TO YOUR REFERRING PHYSICIAN.  You were seen today by Gretchen Dawson NP.   Thank you for choosing [facility] to provide your oncology and hematology care.  To afford each patient quality time with our provider, please arrive at least 15 minutes before your scheduled appointment time.    If you have a lab appointment with the Cancer Center please come in thru the  Main Entrance and check in at the main information desk  You need to re-schedule your appointment should you arrive 10 or more minutes late.  We strive to give you quality time with our providers, and arriving late affects you and other patients whose appointments are after yours.  Also, if you no show three or more times for appointments you may be dismissed from the clinic at the providers discretion.     Again, thank you for choosing [facility].  Our hope is that these requests will decrease the amount of time that you wait before being seen by our physicians.       _____________________________________________________________  Should you have questions after your visit to  Cancer Center, please contact our office at [phone] between the hours of 8:30 a.m. and 4:30 p.m.  Voicemails left after 4:30 p.m. will not be returned until the following business day.  For prescription refill requests, have your pharmacy contact our office.       Resources For Cancer Patients and their Caregivers ? American Cancer Society: Can assist with transportation, wigs, general needs, runs Look Good Feel Better.        1-888-227-6333 ? Cancer Care: Provides financial assistance, online support groups, medication/co-pay assistance.  1-800-813-HOPE (4673) ? [facility] Assists [place] cancer patients and  their families through emotional , educational and financial support.  336-427-4357 ? [place] DSS Where to apply for food stamps, Medicaid and utility assistance. 336-342-1394 ? RCATS: Transportation to medical appointments. 336-347-2287 ? Social Security Administration: May apply for disability if have a Stage IV cancer. 336-342-7796 1-800-772-1213 ? [place] [employer], Disability and Transit Services: Assists with nutrition, care and transit needs. 336-349-2343  Cancer Center Support Programs: [email or web address]@ > Cancer Support Group  2nd Tuesday of the month 1pm-2pm, Journey Room  > Creative Journey  3rd Tuesday of the month 1130am-1pm, Journey Room  > Look Good Feel Better  1st Wednesday of the month 10am-12 noon, Journey Room (Call American Cancer Society to register 1-800-395-5775)    

## 2017-08-10 NOTE — Progress Notes (Signed)
Chemotherapy given today per orders. Patient tolerated it well without problems. Vitals stable and discharged home from clinic ambulatory. Follow up as scheduled. 

## 2017-08-16 ENCOUNTER — Encounter (HOSPITAL_COMMUNITY): Payer: Medicare Other

## 2017-08-16 VITALS — BP 167/80 | HR 64 | Temp 98.3°F | Resp 18 | Wt 202.0 lb

## 2017-08-16 DIAGNOSIS — C9000 Multiple myeloma not having achieved remission: Secondary | ICD-10-CM

## 2017-08-16 LAB — CBC WITH DIFFERENTIAL/PLATELET
BASOS PCT: 0 %
Basophils Absolute: 0 10*3/uL (ref 0.0–0.1)
EOS ABS: 0.2 10*3/uL (ref 0.0–0.7)
EOS PCT: 5 %
HCT: 26 % — ABNORMAL LOW (ref 39.0–52.0)
HEMOGLOBIN: 8.7 g/dL — AB (ref 13.0–17.0)
Lymphocytes Relative: 17 %
Lymphs Abs: 0.6 10*3/uL — ABNORMAL LOW (ref 0.7–4.0)
MCH: 33.1 pg (ref 26.0–34.0)
MCHC: 33.5 g/dL (ref 30.0–36.0)
MCV: 98.9 fL (ref 78.0–100.0)
MONOS PCT: 15 %
Monocytes Absolute: 0.6 10*3/uL (ref 0.1–1.0)
NEUTROS PCT: 63 %
Neutro Abs: 2.4 10*3/uL (ref 1.7–7.7)
PLATELETS: 68 10*3/uL — AB (ref 150–400)
RBC: 2.63 MIL/uL — ABNORMAL LOW (ref 4.22–5.81)
RDW: 14.7 % (ref 11.5–15.5)
WBC: 3.8 10*3/uL — ABNORMAL LOW (ref 4.0–10.5)

## 2017-08-16 LAB — COMPREHENSIVE METABOLIC PANEL
ALK PHOS: 24 U/L — AB (ref 38–126)
ALT: 15 U/L — AB (ref 17–63)
AST: 16 U/L (ref 15–41)
Albumin: 3.6 g/dL (ref 3.5–5.0)
Anion gap: 7 (ref 5–15)
BUN: 16 mg/dL (ref 6–20)
CALCIUM: 9.1 mg/dL (ref 8.9–10.3)
CO2: 29 mmol/L (ref 22–32)
CREATININE: 1.25 mg/dL — AB (ref 0.61–1.24)
Chloride: 103 mmol/L (ref 101–111)
GFR, EST NON AFRICAN AMERICAN: 57 mL/min — AB (ref >60)
Glucose, Bld: 108 mg/dL — ABNORMAL HIGH (ref 65–99)
Potassium: 3.9 mmol/L (ref 3.5–5.1)
Sodium: 139 mmol/L (ref 135–145)
Total Bilirubin: 0.5 mg/dL (ref 0.3–1.2)
Total Protein: 5.7 g/dL — ABNORMAL LOW (ref 6.5–8.1)

## 2017-08-16 MED ORDER — TRAMADOL HCL 50 MG PO TABS: 50.0000 mg | ORAL_TABLET | Freq: Four times a day (QID) | ORAL | 0 refills | Status: DC | PRN

## 2017-08-16 NOTE — Progress Notes (Signed)
Hold tx today per MD for platelet count of 68,000.  MD also notified of pt c/o bilateral lower extremity pain from knees down, rates 5/10.  Order rec'd for Tramadol - Rx printed and given to pt. Instructed pt to call navigator at [facility] and let her know that his tx today was deferred x 1 week, since he is scheduled for bone marrow biopsy tomorrow with Dr. Norma Fredrickson.  Discharged ambulatory in in no distress in c/o spouse.

## 2017-08-17 ENCOUNTER — Ambulatory Visit (HOSPITAL_COMMUNITY): Payer: Medicare Other

## 2017-08-17 ENCOUNTER — Other Ambulatory Visit (HOSPITAL_COMMUNITY): Payer: Self-pay | Admitting: Oncology

## 2017-08-17 ENCOUNTER — Other Ambulatory Visit (HOSPITAL_COMMUNITY): Payer: Self-pay | Admitting: Adult Health

## 2017-08-17 DIAGNOSIS — Z1379 Encounter for other screening for genetic and chromosomal anomalies: Secondary | ICD-10-CM | POA: Diagnosis not present

## 2017-08-17 DIAGNOSIS — C7951 Secondary malignant neoplasm of bone: Secondary | ICD-10-CM

## 2017-08-17 DIAGNOSIS — C50122 Malignant neoplasm of central portion of left male breast: Secondary | ICD-10-CM

## 2017-08-17 DIAGNOSIS — C9000 Multiple myeloma not having achieved remission: Secondary | ICD-10-CM

## 2017-08-17 DIAGNOSIS — D61818 Other pancytopenia: Secondary | ICD-10-CM | POA: Diagnosis not present

## 2017-08-18 ENCOUNTER — Encounter (HOSPITAL_BASED_OUTPATIENT_CLINIC_OR_DEPARTMENT_OTHER): Payer: Medicare Other

## 2017-08-18 VITALS — BP 149/86 | HR 75 | Resp 18

## 2017-08-18 DIAGNOSIS — C7951 Secondary malignant neoplasm of bone: Secondary | ICD-10-CM | POA: Diagnosis not present

## 2017-08-18 DIAGNOSIS — C50122 Malignant neoplasm of central portion of left male breast: Secondary | ICD-10-CM | POA: Diagnosis present

## 2017-08-18 MED ORDER — DENOSUMAB 120 MG/1.7ML ~~LOC~~ SOLN
120.0000 mg | Freq: Once | SUBCUTANEOUS | Status: AC
  Administered 2017-08-18: 120 mg via SUBCUTANEOUS
  Filled 2017-08-18: qty 1.7

## 2017-08-18 NOTE — Progress Notes (Signed)
Johnny Lucas presents today for injection per MD orders. Xgeva 120 mg administered SQ in right Abdomen. Administration without incident. Patient tolerated well. Stable and ambulatory on discharge home with wife.

## 2017-08-18 NOTE — Patient Instructions (Signed)
[place] at [facility] Discharge Instructions  RECOMMENDATIONS MADE BY THE CONSULTANT AND ANY TEST RESULTS WILL BE SENT TO YOUR REFERRING PHYSICIAN.  Xgeva 120 mg injection given as ordered.  Thank you for choosing [place] at [facility] to provide your oncology and hematology care.  To afford each patient quality time with our provider, please arrive at least 15 minutes before your scheduled appointment time.    If you have a lab appointment with the [facility] please come in thru the  Main Entrance and check in at the main information desk  You need to re-schedule your appointment should you arrive 10 or more minutes late.  We strive to give you quality time with our providers, and arriving late affects you and other patients whose appointments are after yours.  Also, if you no show three or more times for appointments you may be dismissed from the clinic at the providers discretion.     Again, thank you for choosing [facility].  Our hope is that these requests will decrease the amount of time that you wait before being seen by our physicians.       _____________________________________________________________  Should you have questions after your visit to [facility], please contact our office at [phone] between the hours of 8:30 a.m. and 4:30 p.m.  Voicemails left after 4:30 p.m. will not be returned until the following business day.  For prescription refill requests, have your pharmacy contact our office.       Resources For Cancer Patients and their Caregivers ? American Cancer Society: Can assist with transportation, wigs, general needs, runs Look Good Feel Better.        501-612-6097 ? Cancer Care: Provides financial assistance, online support groups, medication/co-pay assistance.  1-800-813-HOPE 463-333-4677) ? [place] Assists [place] cancer patients and their  families through emotional , educational and financial support.  (862)870-5306 ? [place] DSS Where to apply for food stamps, Medicaid and utility assistance. 3367508156 ? [employer]: Transportation to medical appointments. 640 087 5069 ? Social Security Administration: May apply for disability if have a Stage IV cancer. 5126056861 629-767-2817 ? [employer], Disability and Transit Services: Assists with nutrition, care and transit needs. [place] Support Programs: @10RELATIVEDAYS @ > Cancer Support Group  2nd Tuesday of the month 1pm-2pm, Journey Room  > Creative Journey  3rd Tuesday of the month 1130am-1pm, Journey Room  > Look Good Feel Better  1st Wednesday of the month 10am-12 noon, Journey Room (Call [place] to register [phone])

## 2017-08-19 ENCOUNTER — Other Ambulatory Visit (HOSPITAL_COMMUNITY): Payer: Self-pay | Admitting: Oncology

## 2017-08-23 ENCOUNTER — Encounter (HOSPITAL_COMMUNITY): Payer: Self-pay

## 2017-08-23 ENCOUNTER — Encounter (HOSPITAL_BASED_OUTPATIENT_CLINIC_OR_DEPARTMENT_OTHER): Payer: Medicare Other

## 2017-08-23 ENCOUNTER — Encounter (HOSPITAL_COMMUNITY): Payer: Medicare Other

## 2017-08-23 ENCOUNTER — Ambulatory Visit (HOSPITAL_COMMUNITY): Payer: Medicare Other

## 2017-08-23 ENCOUNTER — Other Ambulatory Visit (HOSPITAL_COMMUNITY): Payer: Medicare Other

## 2017-08-23 VITALS — BP 142/74 | HR 63 | Temp 97.9°F | Resp 18 | Wt 200.8 lb

## 2017-08-23 DIAGNOSIS — Z5111 Encounter for antineoplastic chemotherapy: Secondary | ICD-10-CM | POA: Diagnosis not present

## 2017-08-23 DIAGNOSIS — C50922 Malignant neoplasm of unspecified site of left male breast: Secondary | ICD-10-CM | POA: Diagnosis not present

## 2017-08-23 DIAGNOSIS — C9000 Multiple myeloma not having achieved remission: Secondary | ICD-10-CM | POA: Diagnosis not present

## 2017-08-23 DIAGNOSIS — Z170 Estrogen receptor positive status [ER+]: Secondary | ICD-10-CM | POA: Diagnosis not present

## 2017-08-23 DIAGNOSIS — G629 Polyneuropathy, unspecified: Secondary | ICD-10-CM | POA: Diagnosis not present

## 2017-08-23 DIAGNOSIS — Z5112 Encounter for antineoplastic immunotherapy: Secondary | ICD-10-CM | POA: Diagnosis not present

## 2017-08-23 LAB — COMPREHENSIVE METABOLIC PANEL
ALBUMIN: 3.4 g/dL — AB (ref 3.5–5.0)
ALT: 10 U/L — AB (ref 17–63)
AST: 16 U/L (ref 15–41)
Alkaline Phosphatase: 22 U/L — ABNORMAL LOW (ref 38–126)
Anion gap: 5 (ref 5–15)
BUN: 14 mg/dL (ref 6–20)
CHLORIDE: 109 mmol/L (ref 101–111)
CO2: 27 mmol/L (ref 22–32)
CREATININE: 1.27 mg/dL — AB (ref 0.61–1.24)
Calcium: 8.8 mg/dL — ABNORMAL LOW (ref 8.9–10.3)
GFR calc Af Amer: >60 mL/min (ref >60)
GFR calc non Af Amer: 56 mL/min — ABNORMAL LOW (ref >60)
GLUCOSE: 99 mg/dL (ref 65–99)
Potassium: 3.8 mmol/L (ref 3.5–5.1)
SODIUM: 141 mmol/L (ref 135–145)
Total Bilirubin: 0.2 mg/dL — ABNORMAL LOW (ref 0.3–1.2)
Total Protein: 5.5 g/dL — ABNORMAL LOW (ref 6.5–8.1)

## 2017-08-23 LAB — CBC WITH DIFFERENTIAL/PLATELET
BASOS ABS: 0 10*3/uL (ref 0.0–0.1)
Basophils Relative: 0 %
EOS PCT: 4 %
Eosinophils Absolute: 0.2 10*3/uL (ref 0.0–0.7)
HCT: 25.9 % — ABNORMAL LOW (ref 39.0–52.0)
HEMOGLOBIN: 8.6 g/dL — AB (ref 13.0–17.0)
LYMPHS ABS: 0.6 10*3/uL — AB (ref 0.7–4.0)
LYMPHS PCT: 12 %
MCH: 33.7 pg (ref 26.0–34.0)
MCHC: 33.2 g/dL (ref 30.0–36.0)
MCV: 101.6 fL — AB (ref 78.0–100.0)
Monocytes Absolute: 0.4 10*3/uL (ref 0.1–1.0)
Monocytes Relative: 8 %
NEUTROS ABS: 3.4 10*3/uL (ref 1.7–7.7)
NEUTROS PCT: 76 %
Platelets: 142 10*3/uL — ABNORMAL LOW (ref 150–400)
RBC: 2.55 MIL/uL — AB (ref 4.22–5.81)
RDW: 14.9 % (ref 11.5–15.5)
WBC: 4.5 10*3/uL (ref 4.0–10.5)

## 2017-08-23 MED ORDER — SODIUM CHLORIDE 0.9 % IV SOLN
Freq: Once | INTRAVENOUS | Status: AC
  Administered 2017-08-23: 10:00:00 via INTRAVENOUS

## 2017-08-23 MED ORDER — PALONOSETRON HCL INJECTION 0.25 MG/5ML
INTRAVENOUS | Status: AC
  Filled 2017-08-23: qty 5

## 2017-08-23 MED ORDER — SODIUM CHLORIDE 0.9% FLUSH
3.0000 mL | INTRAVENOUS | Status: DC | PRN
  Administered 2017-08-23: 3 mL via INTRAVENOUS
  Filled 2017-08-23: qty 10

## 2017-08-23 MED ORDER — PALONOSETRON HCL INJECTION 0.25 MG/5ML
0.2500 mg | Freq: Once | INTRAVENOUS | Status: AC
  Administered 2017-08-23: 0.25 mg via INTRAVENOUS

## 2017-08-23 MED ORDER — DEXAMETHASONE SODIUM PHOSPHATE 10 MG/ML IJ SOLN
INTRAMUSCULAR | Status: AC
  Filled 2017-08-23: qty 1

## 2017-08-23 MED ORDER — SODIUM CHLORIDE 0.9 % IV SOLN
300.0000 mg/m2 | Freq: Once | INTRAVENOUS | Status: AC
  Administered 2017-08-23: 640 mg via INTRAVENOUS
  Filled 2017-08-23: qty 32

## 2017-08-23 MED ORDER — DEXAMETHASONE SODIUM PHOSPHATE 10 MG/ML IJ SOLN
10.0000 mg | Freq: Once | INTRAMUSCULAR | Status: AC
  Administered 2017-08-23: 10 mg via INTRAVENOUS

## 2017-08-23 MED ORDER — SODIUM CHLORIDE 0.9 % IV SOLN
Freq: Once | INTRAVENOUS | Status: AC
  Administered 2017-08-23: 09:00:00 via INTRAVENOUS

## 2017-08-23 MED ORDER — DEXTROSE 5 % IV SOLN
57.0000 mg/m2 | Freq: Once | INTRAVENOUS | Status: AC
  Administered 2017-08-23: 120 mg via INTRAVENOUS
  Filled 2017-08-23: qty 60

## 2017-08-23 NOTE — Patient Instructions (Signed)
[place] Discharge Instructions for Patients Receiving Chemotherapy  Today you received the following chemotherapy agents Cytoxan and Krypolis.   To help prevent nausea and vomiting after your treatment, we encourage you to take your nausea medication.    If you develop nausea and vomiting that is not controlled by your nausea medication, call the clinic.   BELOW ARE SYMPTOMS THAT SHOULD BE REPORTED IMMEDIATELY:  *FEVER GREATER THAN 100.5 F  *CHILLS WITH OR WITHOUT FEVER  NAUSEA AND VOMITING THAT IS NOT CONTROLLED WITH YOUR NAUSEA MEDICATION  *UNUSUAL SHORTNESS OF BREATH  *UNUSUAL BRUISING OR BLEEDING  TENDERNESS IN MOUTH AND THROAT WITH OR WITHOUT PRESENCE OF ULCERS  *URINARY PROBLEMS  *BOWEL PROBLEMS  UNUSUAL RASH Items with * indicate a potential emergency and should be followed up as soon as possible.  Feel free to call the clinic you have any questions or concerns. The clinic phone number is [phone].  Please show the [place] at check-in to the Emergency Department and triage nurse.

## 2017-08-23 NOTE — Progress Notes (Signed)
Patient tolerated chemotherapy with no complaints voiced.  IV site clean and dry with no bruising or swelling noted.  Good blood return noted before and after administration of chemotherapy.  VSS with discharge.  Left ambulatory with wife for appointment in [facility].  No complaints voiced with discharge.

## 2017-08-23 NOTE — Treatment Plan (Signed)
Pt was held 9/20 and 9/ 21 due to thrombocytopenia.  Per Mike Craze keep at 56/m2. ( Dr Norma Fredrickson dosing).

## 2017-08-24 ENCOUNTER — Encounter (HOSPITAL_COMMUNITY): Payer: Self-pay

## 2017-08-24 ENCOUNTER — Encounter (HOSPITAL_BASED_OUTPATIENT_CLINIC_OR_DEPARTMENT_OTHER): Payer: Medicare Other

## 2017-08-24 VITALS — BP 130/64 | HR 66 | Temp 97.6°F | Resp 18 | Wt 205.2 lb

## 2017-08-24 DIAGNOSIS — C9000 Multiple myeloma not having achieved remission: Secondary | ICD-10-CM

## 2017-08-24 DIAGNOSIS — Z5112 Encounter for antineoplastic immunotherapy: Secondary | ICD-10-CM

## 2017-08-24 MED ORDER — SODIUM CHLORIDE 0.9 % IV SOLN
Freq: Once | INTRAVENOUS | Status: AC
  Administered 2017-08-24: 11:00:00 via INTRAVENOUS

## 2017-08-24 MED ORDER — DEXAMETHASONE SODIUM PHOSPHATE 10 MG/ML IJ SOLN
10.0000 mg | Freq: Once | INTRAMUSCULAR | Status: AC
  Administered 2017-08-24: 10 mg via INTRAVENOUS
  Filled 2017-08-24: qty 1

## 2017-08-24 MED ORDER — SODIUM CHLORIDE 0.9% FLUSH
10.0000 mL | INTRAVENOUS | Status: DC | PRN
  Administered 2017-08-24: 10 mL
  Filled 2017-08-24: qty 10

## 2017-08-24 MED ORDER — CARFILZOMIB CHEMO INJECTION 60 MG
57.0000 mg/m2 | Freq: Once | INTRAVENOUS | Status: AC
  Administered 2017-08-24: 120 mg via INTRAVENOUS
  Filled 2017-08-24: qty 60

## 2017-08-24 NOTE — Patient Instructions (Signed)
[place] at [facility] Discharge Instructions  RECOMMENDATIONS MADE BY THE CONSULTANT AND ANY TEST RESULTS WILL BE SENT TO YOUR REFERRING PHYSICIAN.  You received your Kyprolis today Follow up as scheduled.  Thank you for choosing [place] at [facility] to provide your oncology and hematology care.  To afford each patient quality time with our provider, please arrive at least 15 minutes before your scheduled appointment time.    If you have a lab appointment with the [facility] please come in thru the  Main Entrance and check in at the main information desk  You need to re-schedule your appointment should you arrive 10 or more minutes late.  We strive to give you quality time with our providers, and arriving late affects you and other patients whose appointments are after yours.  Also, if you no show three or more times for appointments you may be dismissed from the clinic at the providers discretion.     Again, thank you for choosing [name].  Our hope is that these requests will decrease the amount of time that you wait before being seen by our physicians.       _____________________________________________________________  Should you have questions after your visit to [facility], please contact our office at [phone] between the hours of 8:30 a.m. and 4:30 p.m.  Voicemails left after 4:30 p.m. will not be returned until the following business day.  For prescription refill requests, have your pharmacy contact our office.       Resources For Cancer Patients and their Caregivers ? American Cancer Society: Can assist with transportation, wigs, general needs, runs Look Good Feel Better.        [phone] ? Cancer Care: Provides financial assistance, online support groups, medication/co-pay assistance.  1-800-813-HOPE (407)677-9908) ? [place] Assists [place] cancer  patients and their families through emotional , educational and financial support.  561-562-1931 ? [place] DSS Where to apply for food stamps, Medicaid and utility assistance. 239-512-6371 ? [employer]: Transportation to medical appointments. 208-692-9490 ? Social Security Administration: May apply for disability if have a Stage IV cancer. 931-019-1199 3402745590 ? [employer], Disability and Transit Services: Assists with nutrition, care and transit needs. [place] Support Programs: @10RELATIVEDAYS @ > Cancer Support Group  2nd Tuesday of the month 1pm-2pm, Journey Room  > Creative Journey  3rd Tuesday of the month 1130am-1pm, Journey Room  > Look Good Feel Better  1st Wednesday of the month 10am-12 noon, Journey Room (Call [place] to register [phone])

## 2017-08-24 NOTE — Progress Notes (Signed)
Patient tolerated treatment without incidence. Patient discharged ambulatory and in stable condition from clinic.  Patient to follow up as scheduled. 

## 2017-08-31 ENCOUNTER — Other Ambulatory Visit (HOSPITAL_COMMUNITY): Payer: Self-pay | Admitting: Oncology

## 2017-08-31 ENCOUNTER — Encounter (HOSPITAL_COMMUNITY): Payer: Medicare Other | Attending: Oncology

## 2017-08-31 ENCOUNTER — Encounter (HOSPITAL_COMMUNITY): Payer: Medicare Other

## 2017-08-31 ENCOUNTER — Encounter (HOSPITAL_COMMUNITY): Payer: Self-pay

## 2017-08-31 VITALS — BP 149/71 | HR 72 | Temp 98.3°F | Resp 20 | Wt 203.4 lb

## 2017-08-31 DIAGNOSIS — C9000 Multiple myeloma not having achieved remission: Secondary | ICD-10-CM

## 2017-08-31 DIAGNOSIS — Z5111 Encounter for antineoplastic chemotherapy: Secondary | ICD-10-CM | POA: Diagnosis not present

## 2017-08-31 DIAGNOSIS — Z5112 Encounter for antineoplastic immunotherapy: Secondary | ICD-10-CM

## 2017-08-31 LAB — CBC WITH DIFFERENTIAL/PLATELET
BASOS ABS: 0 10*3/uL (ref 0.0–0.1)
BASOS PCT: 0 %
EOS ABS: 0 10*3/uL (ref 0.0–0.7)
EOS PCT: 0 %
HCT: 25.5 % — ABNORMAL LOW (ref 39.0–52.0)
Hemoglobin: 8.3 g/dL — ABNORMAL LOW (ref 13.0–17.0)
LYMPHS ABS: 0.4 10*3/uL — AB (ref 0.7–4.0)
LYMPHS PCT: 3 %
MCH: 33.5 pg (ref 26.0–34.0)
MCHC: 32.5 g/dL (ref 30.0–36.0)
MCV: 102.8 fL — ABNORMAL HIGH (ref 78.0–100.0)
MONOS PCT: 6 %
Monocytes Absolute: 0.7 10*3/uL (ref 0.1–1.0)
NEUTROS PCT: 91 %
Neutro Abs: 11.9 10*3/uL — ABNORMAL HIGH (ref 1.7–7.7)
PLATELETS: 125 10*3/uL — AB (ref 150–400)
RBC: 2.48 MIL/uL — ABNORMAL LOW (ref 4.22–5.81)
RDW: 14.5 % (ref 11.5–15.5)
WBC: 13 10*3/uL — AB (ref 4.0–10.5)

## 2017-08-31 LAB — COMPREHENSIVE METABOLIC PANEL
ALBUMIN: 3.7 g/dL (ref 3.5–5.0)
ALT: 14 U/L — ABNORMAL LOW (ref 17–63)
ANION GAP: 8 (ref 5–15)
AST: 21 U/L (ref 15–41)
Alkaline Phosphatase: 22 U/L — ABNORMAL LOW (ref 38–126)
BUN: 19 mg/dL (ref 6–20)
CHLORIDE: 106 mmol/L (ref 101–111)
CO2: 23 mmol/L (ref 22–32)
Calcium: 9 mg/dL (ref 8.9–10.3)
Creatinine, Ser: 1.23 mg/dL (ref 0.61–1.24)
GFR calc Af Amer: >60 mL/min (ref >60)
GFR calc non Af Amer: 58 mL/min — ABNORMAL LOW (ref >60)
GLUCOSE: 152 mg/dL — AB (ref 65–99)
POTASSIUM: 4 mmol/L (ref 3.5–5.1)
Sodium: 137 mmol/L (ref 135–145)
Total Bilirubin: 0.6 mg/dL (ref 0.3–1.2)
Total Protein: 5.7 g/dL — ABNORMAL LOW (ref 6.5–8.1)

## 2017-08-31 MED ORDER — SODIUM CHLORIDE 0.9 % IV SOLN
300.0000 mg/m2 | Freq: Once | INTRAVENOUS | Status: DC
Start: 2017-08-31 — End: 2017-08-31

## 2017-08-31 MED ORDER — PALONOSETRON HCL INJECTION 0.25 MG/5ML
0.2500 mg | Freq: Once | INTRAVENOUS | Status: AC
  Administered 2017-08-31: 0.25 mg via INTRAVENOUS

## 2017-08-31 MED ORDER — PALONOSETRON HCL INJECTION 0.25 MG/5ML
INTRAVENOUS | Status: AC
  Filled 2017-08-31: qty 5

## 2017-08-31 MED ORDER — SODIUM CHLORIDE 0.9 % IV SOLN: Freq: Once | INTRAVENOUS | Status: DC

## 2017-08-31 MED ORDER — DEXTROSE 5 % IV SOLN: 57.0000 mg/m2 | Freq: Once | INTRAVENOUS | Status: DC

## 2017-08-31 MED ORDER — DEXAMETHASONE SODIUM PHOSPHATE 10 MG/ML IJ SOLN
10.0000 mg | Freq: Once | INTRAMUSCULAR | Status: AC
  Administered 2017-08-31: 10 mg via INTRAVENOUS

## 2017-08-31 MED ORDER — PALONOSETRON HCL INJECTION 0.25 MG/5ML: 0.2500 mg | Freq: Once | INTRAVENOUS | Status: DC

## 2017-08-31 MED ORDER — SODIUM CHLORIDE 0.9 % IV SOLN
300.0000 mg/m2 | Freq: Once | INTRAVENOUS | Status: AC
  Administered 2017-08-31: 640 mg via INTRAVENOUS
  Filled 2017-08-31: qty 32

## 2017-08-31 MED ORDER — DEXTROSE 5 % IV SOLN
57.0000 mg/m2 | Freq: Once | INTRAVENOUS | Status: AC
  Administered 2017-08-31: 120 mg via INTRAVENOUS
  Filled 2017-08-31: qty 60

## 2017-08-31 MED ORDER — SODIUM CHLORIDE 0.9% FLUSH
10.0000 mL | INTRAVENOUS | Status: DC | PRN
  Administered 2017-08-31: 10 mL
  Filled 2017-08-31: qty 10

## 2017-08-31 MED ORDER — SODIUM CHLORIDE 0.9 % IV SOLN
INTRAVENOUS | Status: DC
  Administered 2017-08-31: 500 mL via INTRAVENOUS

## 2017-08-31 MED ORDER — DEXAMETHASONE SODIUM PHOSPHATE 10 MG/ML IJ SOLN: 10.0000 mg | Freq: Once | INTRAMUSCULAR | Status: DC

## 2017-08-31 MED ORDER — SODIUM CHLORIDE 0.9 % IV SOLN
Freq: Once | INTRAVENOUS | Status: AC
Start: 2017-08-31 — End: 2017-08-31
  Administered 2017-08-31: 11:00:00 via INTRAVENOUS

## 2017-08-31 MED ORDER — DEXAMETHASONE SODIUM PHOSPHATE 10 MG/ML IJ SOLN
INTRAMUSCULAR | Status: AC
  Filled 2017-08-31: qty 1

## 2017-08-31 NOTE — Treatment Plan (Signed)
Clarification  Per Elzie Rings and Dr Talbert Cage. Treat today and tomorrow and then pt will see Dr Norma Fredrickson for bone marrow tx.

## 2017-08-31 NOTE — Patient Instructions (Signed)
[place] Discharge Instructions for Patients Receiving Chemotherapy  Today you received the following chemotherapy agents Cytoxan and kyprolis  To help prevent nausea and vomiting after your treatment, we encourage you to take your nausea medication.    If you develop nausea and vomiting that is not controlled by your nausea medication, call the clinic.   BELOW ARE SYMPTOMS THAT SHOULD BE REPORTED IMMEDIATELY:  *FEVER GREATER THAN 100.5 F  *CHILLS WITH OR WITHOUT FEVER  NAUSEA AND VOMITING THAT IS NOT CONTROLLED WITH YOUR NAUSEA MEDICATION  *UNUSUAL SHORTNESS OF BREATH  *UNUSUAL BRUISING OR BLEEDING  TENDERNESS IN MOUTH AND THROAT WITH OR WITHOUT PRESENCE OF ULCERS  *URINARY PROBLEMS  *BOWEL PROBLEMS  UNUSUAL RASH Items with * indicate a potential emergency and should be followed up as soon as possible.  Feel free to call the clinic you have any questions or concerns. The clinic phone number is [phone].  Please show the [place] at check-in to the Emergency Department and triage nurse.

## 2017-08-31 NOTE — Progress Notes (Signed)
Patient to treatment room and stated appetite ok and managing constipation with miralax and prune jusice.  Stated neuropathy in legs and toes is better and none in fingers.  Able to use buttons and zippers with no difficulty.  Rates pain 3 in lower legs and denied SOB.   Reviewed labs with Dr. Talbert Cage.  Ok to treat today. Patient to receive one more week of treatment per patient and Dr.Rodriguez and to start office visits in [place].   Patient tolerated chemotherapy with no complaints.  IV site clean and dry with no bruising or swelling at site.  Band aid applied.  Patient discharged ambulatory with wife and VSS.

## 2017-08-31 NOTE — Treatment Plan (Signed)
Pt was held on 8/20 and 8/21 for low platelets.  Pt was treated with C2d15 on 8/27 and C2d16 on 8/28.  Treatment orders were dropped today on 09/04 for C3d1 without week off and no adjustment made to days 8,9 and 15,16. Which would give the patient 4 treatments weeks.  Per Dr Talbert Cage. Don't treat today and reschedule for next week 09/11 C3d1

## 2017-09-01 ENCOUNTER — Encounter (HOSPITAL_BASED_OUTPATIENT_CLINIC_OR_DEPARTMENT_OTHER): Payer: Medicare Other

## 2017-09-01 ENCOUNTER — Encounter (HOSPITAL_COMMUNITY): Payer: Self-pay

## 2017-09-01 ENCOUNTER — Encounter (HOSPITAL_BASED_OUTPATIENT_CLINIC_OR_DEPARTMENT_OTHER): Payer: Medicare Other | Admitting: Oncology

## 2017-09-01 VITALS — BP 143/78 | HR 70 | Temp 98.8°F | Resp 18

## 2017-09-01 VITALS — BP 145/71 | HR 76 | Resp 16 | Ht 68.0 in | Wt 206.0 lb

## 2017-09-01 DIAGNOSIS — D649 Anemia, unspecified: Secondary | ICD-10-CM | POA: Diagnosis not present

## 2017-09-01 DIAGNOSIS — C50122 Malignant neoplasm of central portion of left male breast: Secondary | ICD-10-CM

## 2017-09-01 DIAGNOSIS — C9000 Multiple myeloma not having achieved remission: Secondary | ICD-10-CM | POA: Diagnosis not present

## 2017-09-01 DIAGNOSIS — C7951 Secondary malignant neoplasm of bone: Secondary | ICD-10-CM

## 2017-09-01 DIAGNOSIS — D696 Thrombocytopenia, unspecified: Secondary | ICD-10-CM | POA: Diagnosis not present

## 2017-09-01 DIAGNOSIS — Z5112 Encounter for antineoplastic immunotherapy: Secondary | ICD-10-CM

## 2017-09-01 MED ORDER — DEXAMETHASONE SODIUM PHOSPHATE 10 MG/ML IJ SOLN
10.0000 mg | Freq: Once | INTRAMUSCULAR | Status: AC
  Administered 2017-09-01: 10 mg via INTRAVENOUS

## 2017-09-01 MED ORDER — SODIUM CHLORIDE 0.9 % IV SOLN: Freq: Once | INTRAVENOUS | Status: DC

## 2017-09-01 MED ORDER — SODIUM CHLORIDE 0.9 % IV SOLN
Freq: Once | INTRAVENOUS | Status: AC
  Administered 2017-09-01: 10:00:00 via INTRAVENOUS

## 2017-09-01 MED ORDER — DEXTROSE 5 % IV SOLN
42.7500 mg/m2 | Freq: Once | INTRAVENOUS | Status: AC
  Administered 2017-09-01: 90 mg via INTRAVENOUS
  Filled 2017-09-01: qty 30

## 2017-09-01 NOTE — Treatment Plan (Signed)
Per Dr Talbert Cage reduce todays dose by 25 %

## 2017-09-01 NOTE — Progress Notes (Signed)
Tolerated tx w/o adverse reaction.  Alert, in no distress.  VSS.  Discharged ambulatory in c/o spouse.  

## 2017-09-01 NOTE — Progress Notes (Signed)
[address], [place] [address]   CLINIC:  Medical Oncology/Hematology  PCP:  [name], MD [place] [address] [phone]   REASON FOR VISIT:  Follow-up for Stage II multiple myeloma AND Stage IV breast cancer with bone mets   CURRENT THERAPY: carfizolmib/cytoxan/dex and Xgeva every 28 days AND Tamoxifen daily    BRIEF ONCOLOGIC HISTORY:    Breast cancer, male ([place])   12/31/2009 Initial Biopsy    Biopsy of L breast       12/31/2009 Pathology Results    Invasive ductal carcinoma, ER/PR+, HER 2 negative      12/31/2009 Imaging    Ultrasound showing a 2.43 x 1.85 x 3 cm hypoechoic spiculated mass in the 12 o clock L breast retroareolar region      01/01/2010 -  Anti-estrogen oral therapy    Tamoxifen 20 mg daily      01/06/2010 Imaging    Bone scan abnormal uptake in the diaphysis of the R humerus, abnormal in the R third, fifth and sixth ribs, lesion also noted in the sternum.      02/03/2010 Surgery    Rod placement and fixation of R humerus by Dr. [name]      02/05/2010 - 02/18/2010 Radiation Therapy    30Gy in 10 fractions of 3 Gy per fraction to R pathologic fracture      03/11/2010 -  Chemotherapy    Denosumab monthly, now every 3 months. Started at [facility]       06/09/2016 Imaging    Three hypermetabolic osseous lesions in the sternum, left ilium and right ilium, as discussed above, likely represent osseous metastases. At this time, these are not recognizable on the CT images. 2. No extra skeletal metastatic disease identified in the neck, chest, abdomen or pelvis.      10/13/2016 Progression    PET shows various new and enlarging osseous metastatic lesions with no definite extra osseous metastatic disease currently identified.       12/31/2016 Progression    1. Multifocal hypermetabolic osseous metastases throughout the axial and proximal appendicular skeleton, which are increased in size, number and  metabolism since 10/13/2016 PET-CT. 2. 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 3. No additional sites of hypermetabolic metastatic disease. 4. Chronic right mastoid sinusitis. 5. Aortic atherosclerosis.  One vessel coronary atherosclerosis.       Multiple myeloma ([place])   02/12/2017 Bone Marrow Biopsy    The marrow was variably cellular with large peritrabecular aggregates of kappa restricted plasma cells (66% by aspirate, 30% by Cd138). Cytogenetics +11.       03/01/2017 - 06/29/2017 Chemotherapy    RVD       05/26/2017 Bone Marrow Biopsy    Performed at [facility]:  Plasma cell myeloma in a 30% cellularmarrow with decreased trilineage hematopoiesis and 42% kappalight chain restricted plasma cells on the aspirate smears andlarge aggregates on the core biopsy.       07/12/2017 -  Chemotherapy    Cycle 1 of carfizolmib/cyclophosphamide/dexamethasone          HISTORY OF PRESENT ILLNESS:       INTERVAL HISTORY:  Johnny Lucas 71 y.o. male here for routine follow-up for multiple myeloma and Stage IV metastatic breast cancer.   Patient presents today for cycle 3 day 2 of kyprolis/cytoxan/dex. Kyprolis has been dose reduced by 25%  per recommendations from his transplant team for his neuropathy. Patient states his neuropathy in his legs/feet has actually been improving. He continues to take gabapentin. He had a repeat bone marrow biopsy pre-transplant on 08/17/17 at [facility] which demonstrated hypocellular marrow at 20% with plasma cell involvement 5% overall but focally at 20%. His transplant team has been very happy with his response to [facility] [place]. He states that he has been doing well except that he feels tired today after his day 1 of treatment yesterday. He is accompanied by his wife today. He denies any chest pain, shortness of breath, abdominal pain, focal  weakness.  REVIEW OF SYSTEMS:  Review of Systems  Constitutional: Positive for fatigue. Negative for appetite change, chills and fever.  HENT:  Negative.  Negative for lump/mass and nosebleeds.   Eyes: Negative.   Respiratory: Negative.  Negative for cough and shortness of breath.   Cardiovascular: Negative.  Negative for chest pain and leg swelling.  Gastrointestinal: Negative.  Negative for abdominal pain, blood in stool, constipation, diarrhea, nausea and vomiting.  Endocrine: Negative.   Genitourinary: Negative.  Negative for dysuria and hematuria.   Musculoskeletal: Negative for arthralgias, back pain and myalgias.  Skin: Negative.  Negative for rash.  Neurological: Positive for numbness. Negative for dizziness and headaches.  Hematological: Negative.  Negative for adenopathy. Does not bruise/bleed easily.  Psychiatric/Behavioral: Negative.  Negative for depression and sleep disturbance. The patient is not nervous/anxious.      PAST MEDICAL/SURGICAL HISTORY:  Past Medical History:  Diagnosis Date  . Anxiety   . Bone metastases ([place]) 09/10/2016  . Breast cancer ([place]) 2011   [place] IV breast cancer; radiation and tamoxifen  . Breast cancer, male ([place])    Sta[place] IV breast cancer; radiation and tamoxifen  Overview:  Left breast ca with mets bone  Overview:  METS TO BONE  . GERD (gastroesophageal reflux disease)   . Hypertension   . Macular degeneration   . Multiple myeloma ([place]) 02/18/2017   Past Surgical History:  Procedure Laterality Date  . BACK SURGERY    . HERNIA REPAIR    . right arm surgery       SOCIAL HISTORY:  Social History   Social History  . Marital status: Married    Spouse name: N/A  . Number of children: 3  . Years of education: N/A   Occupational History  . Not on file.   Social History Main Topics  . Smoking status: Never Smoker  . Smokeless tobacco: Former Systems developer  . Alcohol use 14.4 oz/week    24 Cans of beer per week  . Drug use: No  . Sexual  activity: Not on file   Other Topics Concern  . Not on file   Social History Narrative  . No narrative on file    FAMILY HISTORY:  Family History  Problem Relation Age of Onset  . Stroke Mother   . Cancer Maternal Aunt        cancer NOS; died in her 63s  . Lung cancer Maternal Uncle        smoker    CURRENT MEDICATIONS:  Outpatient Encounter Prescriptions as of 09/01/2017  Medication Sig Note  . acyclovir (ZOVIRAX) 400 MG tablet Take 1 tablet (400 mg total) by mouth daily.   Marland Kitchen albuterol (PROVENTIL HFA;VENTOLIN HFA) 108 (90 Base) MCG/ACT inhaler Inhale 2 puffs into the lungs every 6 (six) hours as needed for wheezing or shortness of breath.   . ALPRAZolam (XANAX) 0.5 MG tablet  TAKE ONE TABLET BY MOUTH AT BEDTIME AS NEEDED FOR ANXIETY   . aspirin EC 81 MG tablet Take 162 mg by mouth daily.  09/10/2016: Received from: [facility]: Take 162 mg by mouth daily.  . Calcium Carb-Cholecalciferol (CALCIUM 500 + D3) 500-600 MG-UNIT TABS Take 2 tablets by mouth daily.   . calcium carbonate (TUMS - DOSED IN MG ELEMENTAL CALCIUM) 500 MG chewable tablet Chew 3 tablets by mouth daily.    . calcium-vitamin D (OSCAL WITH D) 500-200 MG-UNIT TABS tablet TAKE 2 TABLETS BY MOUTH DAILY.   Marland Kitchen Carfilzomib (KYPROLIS IV) Inject into the vein. Day 1 and 2, day 8 and 9, day 15 and 16 every 28 days   . CYCLOPHOSPHAMIDE IV Inject into the vein. Days 1,8,15 every 28 days   . denosumab (XGEVA) 120 MG/1.7ML SOLN injection Inject 120 mg into the skin once. Every 28 days   . dexamethasone (DECADRON) 4 MG tablet Take 10 tablets (87m) on days 1,8,15, and 22 of chemo. Repeat every 28 days.   . ferrous sulfate 325 (65 FE) MG EC tablet Take 1 tablet (325 mg total) by mouth 2 (two) times daily after a meal.   . gabapentin (NEURONTIN) 300 MG capsule Take two times a day, then increase to three times a day in a few weeks.   .Marland Kitchenloratadine (CLARITIN) 10 MG tablet Take 1 tablet (10 mg total) by mouth daily. (Patient  taking differently: Take 10 mg by mouth daily. Takes as needed.)   . metoprolol succinate (TOPROL-XL) 100 MG 24 hr tablet Take 100 mg by mouth daily.  01/04/2017: Received from: External Pharmacy  . Multiple Vitamins-Minerals (OCUVITE PO) Take by mouth daily.  09/10/2016: [place] Take 1 tablet by mouth daily.  . ondansetron (ZOFRAN) 8 MG tablet Take 1 tablet (8 mg total) by mouth every 8 (eight) hours as needed for nausea or vomiting.   . pantoprazole (PROTONIX) 40 MG tablet Take 40 mg by mouth daily. 03/29/2017: Takes prn  . polyethylene glycol (MIRALAX / GLYCOLAX) packet Take 17 g by mouth daily as needed.   . prochlorperazine (COMPAZINE) 10 MG tablet Take 1 tablet (10 mg total) by mouth every 6 (six) hours as needed for nausea or vomiting.   . sildenafil (REVATIO) 20 MG tablet TAKE UP TO FIVE TABLETS BY MOUTH EVERY DAY AS NEEDED   . tamoxifen (NOLVADEX) 20 MG tablet Take 1 tablet (20 mg total) by mouth daily.   . traMADol (ULTRAM) 50 MG tablet Take 1 tablet (50 mg total) by mouth every 6 (six) hours as needed.    No facility-administered encounter medications on file as of 09/01/2017.     ALLERGIES:  No Known Allergies   PHYSICAL EXAM:  ECOG Performance status: 1 - Symptomatic, but largely independent.   Vitals:   09/01/17 0926  BP: (!) 145/71  Pulse: 76  Resp: 16  SpO2: 94%   Filed Weights   09/01/17 0926  Weight: 206 lb (93.4 kg)    Physical Exam  Constitutional: He is oriented to person, place, and time and well-developed, well-nourished, and in no distress. No distress.  HENT:  Head: Normocephalic.  Mouth/Throat: Oropharynx is clear and moist. No oropharyngeal exudate.  Eyes: Pupils are equal, round, and reactive to light. Conjunctivae are normal. No scleral icterus.  Neck: Normal range of motion. Neck supple.  Cardiovascular: Normal rate and regular rhythm.   No murmur heard. Pulmonary/Chest: Effort normal and breath sounds normal. No  respiratory distress. He has no wheezes. He has no rales.  Abdominal: Soft. Bowel sounds are normal. There is no tenderness. There is no rebound and no guarding.  Musculoskeletal: Normal range of motion. He exhibits no edema.  Lymphadenopathy:    He has no cervical adenopathy.       Right: No supraclavicular adenopathy present.       Left: No supraclavicular adenopathy present.  Neurological: He is alert and oriented to person, place, and time. No cranial nerve deficit. Gait normal.  Skin: Skin is warm and dry.  Psychiatric: Mood, memory, affect and judgment normal.  Nursing note and vitals reviewed.    LABORATORY DATA:  I have reviewed the labs as listed.  CBC    Component Value Date/Time   WBC 13.0 (H) 08/31/2017 1005   RBC 2.48 (L) 08/31/2017 1005   HGB 8.3 (L) 08/31/2017 1005   HCT 25.5 (L) 08/31/2017 1005   PLT 125 (L) 08/31/2017 1005   MCV 102.8 (H) 08/31/2017 1005   MCH 33.5 08/31/2017 1005   MCHC 32.5 08/31/2017 1005   RDW 14.5 08/31/2017 1005   LYMPHSABS 0.4 (L) 08/31/2017 1005   MONOABS 0.7 08/31/2017 1005   EOSABS 0.0 08/31/2017 1005   BASOSABS 0.0 08/31/2017 1005   CMP Latest Ref Rng & Units 08/31/2017 08/23/2017 08/16/2017  Glucose 65 - 99 mg/dL 152(H) 99 108(H)  BUN 6 - 20 mg/dL _0 Creatinine 0.61 - 1.24 mg/dL 1.23 1.27(H) 1.25(H)  Sodium 135 - 145 mmol/L 137 141 139  Potassium 3.5 - 5.1 mmol/L 4.0 3.8 3.9  Chloride 101 - 111 mmol/L 106 109 103  CO2 22 - 32 mmol/L _1 Calcium 8.9 - 10.3 mg/dL 9.0 8.8(L) 9.1  Total Protein 6.5 - 8.1 g/dL 5.7(L) 5.5(L) 5.7(L)  Total Bilirubin 0.3 - 1.2 mg/dL 0.6 0.2(L) 0.5  Alkaline Phos 38 - 126 U/L 22(L) 22(L) 24(L)  AST 15 - 41 U/L _2 ALT 17 - 63 U/L 14(L) 10(L) 15(L)    PENDING LABS:    DIAGNOSTIC IMAGING:  *Images and radiologic reports reviewed independently and agree with finds as listed below.  PET scan: 12/31/16      Mammogram 04/06/17       PATHOLOGY:  (R) humerus bone path:  01/21/10 (re-evaluated on 04/09/17)     Genetics report: 01/22/14    Bone marrow biopsy: 02/02/17   Cytogenetics report: 02/10/17       ASSESSMENT & PLAN:   IgG kappa multiple myeloma:  -SPEP in 12/2016 revealed M-spike 2.3% with elevated IgG monoclonal protein (3,255 mg/dL) and elevated kappa light chains (2,129.1 mg/L) & kappa/lambda light chain ratio (272.96).  -Bone marrow biopsy on 02/02/17 demonstrated multiple myeloma; evidence of large peritravecular aggregates with kappa restricted plasma cells (66% by aspirate, 30% by Cd138). Cytogenetics +11.   -Started therapy with Revlimid/Velcade/Decadron (RVD) on 03/01/17. To date he has completed up to cycle 6 day 15 with a partial response. -He has seen Dr. Norma Fredrickson for bone marrow transplant evaluation. Dr. Norma Fredrickson recommended changing his regimen to carfilzomib, cyclophosphamide, dexamethasone in an attempt to get a deeper response. A bone marrow biopsy after 2 additional cycles (if his light chain ratio improves) is recommended to assess cellularity. At that point a PET scan would be indicated to assess for FDG avid lesions and further plan of action. A DEXA scan is recommended to assess for osteoporosis.  - Labs reviewed. Proceed with cycle 3 day 2 of carfilzomib, cyclophosphamide, dexamethasone.  Kyprolis dose reduced by 25% for neuropathy. Cycle 3 day 1,2 was given per recommendations from Dr. Norma Fredrickson. He will not get any more chemo pre-transplant after this.  - Proceed with bone marrow transplant in October at [facility].   Anemia/Mild thrombocytopenia:  -Likely secondary to treatment. Hemoglobin 8.3 g/dl today. Platelet count 123k today. -He had iron studies performed at [facility] as well which demonstrated had low iron saturation and serum iron. Continue ferrous sulfate 325 mg by mouth twice a day with meals. Bone mets:  -Continue monthly Xgeva to help prevent skeletal related events.   Hypocalcemia:  -Currently taking 2000 mg  calcium with vitamin D.   Male left breast cancer:  -Mammogram in 03/2017 with reduced size of initial mass in left breast (originally 3 cm, now 1.1 cm). Since his last visit with me, I had our pathology team re-review his (R) humerus bone pathology from 2011. Staining was done which was ER+; there was not enough tissue to do HER2. However, ER+ staining supports metastatic breast cancer. -Genetics report dated 01/11/14: Variant of Unknown Significance, "[id number]."  -Reviewed with him that while metastatic breast cancer is not curable, it remains treatable.   -Continue Tamoxifen.   Dispo: RTC in November for follow up after he has completed autotransplant.   All questions were answered to patient's stated satisfaction. Encouraged patient to call with any new concerns or questions before his next visit to the cancer center and we can certain see him sooner, if needed.    Twana First, MD

## 2017-09-07 ENCOUNTER — Telehealth (HOSPITAL_COMMUNITY): Payer: Self-pay | Admitting: Emergency Medicine

## 2017-09-07 ENCOUNTER — Other Ambulatory Visit (HOSPITAL_COMMUNITY): Payer: Self-pay | Admitting: Oncology

## 2017-09-07 NOTE — Telephone Encounter (Signed)
Spoke with Wells Guiles at transplant [facility] and I have cancelled any further appt with [place] until pt has transplant.  We will need to see pt back about 1 week after transplant.  I have made that appt with lab work.  Let the pt know what was going on and why and his new appt would be 10/29/2017 at 2:20 pm.  Pt verbalized understanding.

## 2017-09-14 DIAGNOSIS — Z01818 Encounter for other preprocedural examination: Secondary | ICD-10-CM | POA: Diagnosis not present

## 2017-09-14 DIAGNOSIS — D499 Neoplasm of unspecified behavior of unspecified site: Secondary | ICD-10-CM | POA: Diagnosis not present

## 2017-09-14 DIAGNOSIS — C7989 Secondary malignant neoplasm of other specified sites: Secondary | ICD-10-CM | POA: Diagnosis not present

## 2017-09-14 DIAGNOSIS — Z08 Encounter for follow-up examination after completed treatment for malignant neoplasm: Secondary | ICD-10-CM | POA: Diagnosis not present

## 2017-09-14 DIAGNOSIS — Z01810 Encounter for preprocedural cardiovascular examination: Secondary | ICD-10-CM | POA: Diagnosis not present

## 2017-09-14 DIAGNOSIS — D7289 Other specified disorders of white blood cells: Secondary | ICD-10-CM | POA: Diagnosis not present

## 2017-09-14 DIAGNOSIS — R0689 Other abnormalities of breathing: Secondary | ICD-10-CM | POA: Diagnosis not present

## 2017-09-14 DIAGNOSIS — D6949 Other primary thrombocytopenia: Secondary | ICD-10-CM | POA: Diagnosis not present

## 2017-09-14 DIAGNOSIS — C9000 Multiple myeloma not having achieved remission: Secondary | ICD-10-CM | POA: Diagnosis not present

## 2017-09-14 DIAGNOSIS — Z1159 Encounter for screening for other viral diseases: Secondary | ICD-10-CM | POA: Diagnosis not present

## 2017-09-14 DIAGNOSIS — Z79899 Other long term (current) drug therapy: Secondary | ICD-10-CM | POA: Diagnosis not present

## 2017-09-14 DIAGNOSIS — Z5181 Encounter for therapeutic drug level monitoring: Secondary | ICD-10-CM | POA: Diagnosis not present

## 2017-09-15 ENCOUNTER — Ambulatory Visit (HOSPITAL_COMMUNITY): Payer: Medicare Other

## 2017-09-15 ENCOUNTER — Other Ambulatory Visit (HOSPITAL_COMMUNITY): Payer: Self-pay | Admitting: Oncology

## 2017-09-15 ENCOUNTER — Other Ambulatory Visit (HOSPITAL_COMMUNITY): Payer: Medicare Other

## 2017-09-16 ENCOUNTER — Telehealth (HOSPITAL_COMMUNITY): Payer: Self-pay | Admitting: Adult Health

## 2017-09-16 NOTE — Telephone Encounter (Signed)
Briefly discussed patient's case with Walden Field, NP with the stem cell transplant program at [facility] to establish continuity of care since [place], [place] has left [facility] full time. Gershon Mussel was previously communicating with Dr. Rossie Muskrat team re: mutual transplant patients, so I wanted to establish connection with transplant team for Mr. Yepes in Tom's absence.   We have the transplant planning information from Urban Gibson, RN-transplant coordinator with dates/plans for patient's upcoming transplant.  His outpatient transplant is scheduled for 10/07/17.       Mike Craze, NP [place] [phone]

## 2017-09-20 DIAGNOSIS — Z01818 Encounter for other preprocedural examination: Secondary | ICD-10-CM | POA: Diagnosis not present

## 2017-09-20 DIAGNOSIS — C9000 Multiple myeloma not having achieved remission: Secondary | ICD-10-CM | POA: Diagnosis not present

## 2017-09-20 DIAGNOSIS — R918 Other nonspecific abnormal finding of lung field: Secondary | ICD-10-CM | POA: Diagnosis not present

## 2017-09-22 DIAGNOSIS — Z853 Personal history of malignant neoplasm of breast: Secondary | ICD-10-CM | POA: Diagnosis not present

## 2017-09-22 DIAGNOSIS — C9001 Multiple myeloma in remission: Secondary | ICD-10-CM | POA: Diagnosis not present

## 2017-09-22 DIAGNOSIS — Z87891 Personal history of nicotine dependence: Secondary | ICD-10-CM | POA: Diagnosis not present

## 2017-09-22 DIAGNOSIS — I10 Essential (primary) hypertension: Secondary | ICD-10-CM | POA: Diagnosis not present

## 2017-09-22 DIAGNOSIS — Z923 Personal history of irradiation: Secondary | ICD-10-CM | POA: Diagnosis not present

## 2017-09-22 DIAGNOSIS — Z8673 Personal history of transient ischemic attack (TIA), and cerebral infarction without residual deficits: Secondary | ICD-10-CM | POA: Diagnosis not present

## 2017-09-22 DIAGNOSIS — Z170 Estrogen receptor positive status [ER+]: Secondary | ICD-10-CM | POA: Diagnosis not present

## 2017-09-22 DIAGNOSIS — Z79899 Other long term (current) drug therapy: Secondary | ICD-10-CM | POA: Diagnosis not present

## 2017-09-22 DIAGNOSIS — C9000 Multiple myeloma not having achieved remission: Secondary | ICD-10-CM | POA: Diagnosis not present

## 2017-09-22 DIAGNOSIS — Z9221 Personal history of antineoplastic chemotherapy: Secondary | ICD-10-CM | POA: Diagnosis not present

## 2017-09-24 DIAGNOSIS — C9000 Multiple myeloma not having achieved remission: Secondary | ICD-10-CM | POA: Diagnosis not present

## 2017-09-24 DIAGNOSIS — Z52011 Autologous donor, stem cells: Secondary | ICD-10-CM | POA: Diagnosis not present

## 2017-09-25 DIAGNOSIS — Z52011 Autologous donor, stem cells: Secondary | ICD-10-CM | POA: Diagnosis not present

## 2017-09-25 DIAGNOSIS — C9000 Multiple myeloma not having achieved remission: Secondary | ICD-10-CM | POA: Diagnosis not present

## 2017-09-26 DIAGNOSIS — Z52011 Autologous donor, stem cells: Secondary | ICD-10-CM | POA: Diagnosis not present

## 2017-09-26 DIAGNOSIS — C9000 Multiple myeloma not having achieved remission: Secondary | ICD-10-CM | POA: Diagnosis not present

## 2017-09-27 DIAGNOSIS — Z9484 Stem cells transplant status: Secondary | ICD-10-CM | POA: Diagnosis not present

## 2017-09-27 DIAGNOSIS — C9000 Multiple myeloma not having achieved remission: Secondary | ICD-10-CM | POA: Diagnosis not present

## 2017-09-27 DIAGNOSIS — Z52011 Autologous donor, stem cells: Secondary | ICD-10-CM | POA: Diagnosis not present

## 2017-09-27 DIAGNOSIS — Z79899 Other long term (current) drug therapy: Secondary | ICD-10-CM | POA: Diagnosis not present

## 2017-09-28 DIAGNOSIS — Z79899 Other long term (current) drug therapy: Secondary | ICD-10-CM | POA: Diagnosis not present

## 2017-09-28 DIAGNOSIS — C9000 Multiple myeloma not having achieved remission: Secondary | ICD-10-CM | POA: Diagnosis not present

## 2017-09-28 DIAGNOSIS — Z52011 Autologous donor, stem cells: Secondary | ICD-10-CM | POA: Diagnosis not present

## 2017-09-29 DIAGNOSIS — Z52011 Autologous donor, stem cells: Secondary | ICD-10-CM | POA: Diagnosis not present

## 2017-09-29 DIAGNOSIS — C9000 Multiple myeloma not having achieved remission: Secondary | ICD-10-CM | POA: Diagnosis not present

## 2017-09-30 DIAGNOSIS — Z9221 Personal history of antineoplastic chemotherapy: Secondary | ICD-10-CM | POA: Diagnosis not present

## 2017-09-30 DIAGNOSIS — R5383 Other fatigue: Secondary | ICD-10-CM | POA: Diagnosis not present

## 2017-09-30 DIAGNOSIS — R2689 Other abnormalities of gait and mobility: Secondary | ICD-10-CM | POA: Diagnosis not present

## 2017-09-30 DIAGNOSIS — C9000 Multiple myeloma not having achieved remission: Secondary | ICD-10-CM | POA: Diagnosis not present

## 2017-09-30 DIAGNOSIS — R42 Dizziness and giddiness: Secondary | ICD-10-CM | POA: Diagnosis not present

## 2017-09-30 DIAGNOSIS — Z7682 Awaiting organ transplant status: Secondary | ICD-10-CM | POA: Diagnosis not present

## 2017-09-30 DIAGNOSIS — R202 Paresthesia of skin: Secondary | ICD-10-CM | POA: Diagnosis not present

## 2017-09-30 DIAGNOSIS — Z01818 Encounter for other preprocedural examination: Secondary | ICD-10-CM | POA: Diagnosis not present

## 2017-10-04 ENCOUNTER — Encounter (HOSPITAL_COMMUNITY): Payer: Self-pay

## 2017-10-04 ENCOUNTER — Encounter (HOSPITAL_COMMUNITY): Payer: Medicare Other | Attending: Oncology

## 2017-10-04 DIAGNOSIS — Z452 Encounter for adjustment and management of vascular access device: Secondary | ICD-10-CM

## 2017-10-04 DIAGNOSIS — C50922 Malignant neoplasm of unspecified site of left male breast: Secondary | ICD-10-CM | POA: Insufficient documentation

## 2017-10-04 DIAGNOSIS — C7951 Secondary malignant neoplasm of bone: Secondary | ICD-10-CM | POA: Insufficient documentation

## 2017-10-04 DIAGNOSIS — Z79810 Long term (current) use of selective estrogen receptor modulators (SERMs): Secondary | ICD-10-CM | POA: Insufficient documentation

## 2017-10-04 DIAGNOSIS — Z923 Personal history of irradiation: Secondary | ICD-10-CM | POA: Insufficient documentation

## 2017-10-04 DIAGNOSIS — Z170 Estrogen receptor positive status [ER+]: Secondary | ICD-10-CM | POA: Insufficient documentation

## 2017-10-04 DIAGNOSIS — D696 Thrombocytopenia, unspecified: Secondary | ICD-10-CM | POA: Insufficient documentation

## 2017-10-04 DIAGNOSIS — Z8673 Personal history of transient ischemic attack (TIA), and cerebral infarction without residual deficits: Secondary | ICD-10-CM | POA: Insufficient documentation

## 2017-10-04 DIAGNOSIS — Z79899 Other long term (current) drug therapy: Secondary | ICD-10-CM | POA: Insufficient documentation

## 2017-10-04 DIAGNOSIS — D649 Anemia, unspecified: Secondary | ICD-10-CM | POA: Insufficient documentation

## 2017-10-04 DIAGNOSIS — C9000 Multiple myeloma not having achieved remission: Secondary | ICD-10-CM | POA: Diagnosis not present

## 2017-10-04 DIAGNOSIS — Z9221 Personal history of antineoplastic chemotherapy: Secondary | ICD-10-CM | POA: Insufficient documentation

## 2017-10-04 DIAGNOSIS — Z7982 Long term (current) use of aspirin: Secondary | ICD-10-CM | POA: Insufficient documentation

## 2017-10-04 DIAGNOSIS — H3530 Unspecified macular degeneration: Secondary | ICD-10-CM | POA: Insufficient documentation

## 2017-10-04 DIAGNOSIS — I10 Essential (primary) hypertension: Secondary | ICD-10-CM | POA: Insufficient documentation

## 2017-10-04 DIAGNOSIS — K219 Gastro-esophageal reflux disease without esophagitis: Secondary | ICD-10-CM | POA: Insufficient documentation

## 2017-10-04 MED ORDER — HEPARIN SOD (PORK) LOCK FLUSH 100 UNIT/ML IV SOLN
INTRAVENOUS | Status: AC
  Filled 2017-10-04: qty 5

## 2017-10-04 MED ORDER — SODIUM CHLORIDE 0.9% FLUSH
20.0000 mL | INTRAVENOUS | Status: DC | PRN
  Administered 2017-10-04: 20 mL via INTRAVENOUS
  Filled 2017-10-04: qty 20

## 2017-10-04 MED ORDER — HEPARIN SOD (PORK) LOCK FLUSH 100 UNIT/ML IV SOLN
500.0000 [IU] | Freq: Once | INTRAVENOUS | Status: AC
  Administered 2017-10-04: 500 [IU] via INTRAVENOUS

## 2017-10-04 NOTE — Patient Instructions (Signed)
[place] at [facility] Discharge Instructions  RECOMMENDATIONS MADE BY THE CONSULTANT AND ANY TEST RESULTS WILL BE SENT TO YOUR REFERRING PHYSICIAN.  Central line dressing change done Follow up as scheduled.  Thank you for choosing [place] at [facility] to provide your oncology and hematology care.  To afford each patient quality time with our provider, please arrive at least 15 minutes before your scheduled appointment time.    If you have a lab appointment with the [facility] please come in thru the  Main Entrance and check in at the main information desk  You need to re-schedule your appointment should you arrive 10 or more minutes late.  We strive to give you quality time with our providers, and arriving late affects you and other patients whose appointments are after yours.  Also, if you no show three or more times for appointments you may be dismissed from the clinic at the providers discretion.     Again, thank you for choosing [facility].  Our hope is that these requests will decrease the amount of time that you wait before being seen by our physicians.       _____________________________________________________________  Should you have questions after your visit to [facility], please contact our office at [phone] between the hours of 8:30 a.m. and 4:30 p.m.  Voicemails left after 4:30 p.m. will not be returned until the following business day.  For prescription refill requests, have your pharmacy contact our office.       Resources For Cancer Patients and their Caregivers ? American Cancer Society: Can assist with transportation, wigs, general needs, runs Look Good Feel Better.        6611171452 ? Cancer Care: Provides financial assistance, online support groups, medication/co-pay assistance.  1-800-813-HOPE 514-225-7015) ? [place] Assists [place] cancer  patients and their families through emotional , educational and financial support.  330-416-8657 ? [place] DSS Where to apply for food stamps, Medicaid and utility assistance. 425-833-0221 ? [facility]: Transportation to medical appointments. 8632135660 ? Social Security Administration: May apply for disability if have a Stage IV cancer. 435-615-2006 781-307-7698 ? [employer], Disability and Transit Services: Assists with nutrition, care and transit needs. [place] Support Programs: @10RELATIVEDAYS @ > Cancer Support Group  2nd Tuesday of the month 1pm-2pm, Journey Room  > Creative Journey  3rd Tuesday of the month 1130am-1pm, Journey Room  > Look Good Feel Better  1st Wednesday of the month 10am-12 noon, Journey Room (Call [name] to register [phone])

## 2017-10-04 NOTE — Progress Notes (Signed)
Johnny Lucas presented for central line flush. Right internal jugular triple lumen located right chest Good blood return present in all three lumens.  All three lumens flushed with 74ml NS and 35ml (10units /40ml) per lumen per [facility] protocol. Caps changed x 3. Procedure without incident. Patient tolerated procedure well.  Treatment given per orders. Patient tolerated it well without problems. Vitals stable and discharged home from clinic ambulatory. Follow up as scheduled.

## 2017-10-05 ENCOUNTER — Other Ambulatory Visit (HOSPITAL_COMMUNITY): Payer: Self-pay | Admitting: *Deleted

## 2017-10-05 MED ORDER — FERROUS SULFATE 325 (65 FE) MG PO TBEC: 325.0000 mg | DELAYED_RELEASE_TABLET | Freq: Two times a day (BID) | ORAL | 3 refills | Status: DC

## 2017-10-05 MED ORDER — GABAPENTIN 300 MG PO CAPS: ORAL_CAPSULE | ORAL | 2 refills | Status: DC

## 2017-10-06 DIAGNOSIS — C9000 Multiple myeloma not having achieved remission: Secondary | ICD-10-CM | POA: Diagnosis not present

## 2017-10-07 DIAGNOSIS — G629 Polyneuropathy, unspecified: Secondary | ICD-10-CM | POA: Diagnosis not present

## 2017-10-07 DIAGNOSIS — C9000 Multiple myeloma not having achieved remission: Secondary | ICD-10-CM | POA: Diagnosis not present

## 2017-10-07 DIAGNOSIS — L299 Pruritus, unspecified: Secondary | ICD-10-CM | POA: Diagnosis not present

## 2017-10-07 DIAGNOSIS — D61810 Antineoplastic chemotherapy induced pancytopenia: Secondary | ICD-10-CM | POA: Diagnosis not present

## 2017-10-07 DIAGNOSIS — R110 Nausea: Secondary | ICD-10-CM | POA: Diagnosis not present

## 2017-10-07 DIAGNOSIS — D61818 Other pancytopenia: Secondary | ICD-10-CM | POA: Diagnosis not present

## 2017-10-08 DIAGNOSIS — K5900 Constipation, unspecified: Secondary | ICD-10-CM | POA: Diagnosis not present

## 2017-10-08 DIAGNOSIS — Z79899 Other long term (current) drug therapy: Secondary | ICD-10-CM | POA: Diagnosis not present

## 2017-10-08 DIAGNOSIS — Z8673 Personal history of transient ischemic attack (TIA), and cerebral infarction without residual deficits: Secondary | ICD-10-CM | POA: Diagnosis not present

## 2017-10-08 DIAGNOSIS — C9000 Multiple myeloma not having achieved remission: Secondary | ICD-10-CM | POA: Diagnosis not present

## 2017-10-08 DIAGNOSIS — Z7982 Long term (current) use of aspirin: Secondary | ICD-10-CM | POA: Diagnosis not present

## 2017-10-08 DIAGNOSIS — Z9484 Stem cells transplant status: Secondary | ICD-10-CM | POA: Diagnosis not present

## 2017-10-08 DIAGNOSIS — D61810 Antineoplastic chemotherapy induced pancytopenia: Secondary | ICD-10-CM | POA: Diagnosis not present

## 2017-10-08 DIAGNOSIS — Z853 Personal history of malignant neoplasm of breast: Secondary | ICD-10-CM | POA: Diagnosis not present

## 2017-10-08 DIAGNOSIS — I2510 Atherosclerotic heart disease of native coronary artery without angina pectoris: Secondary | ICD-10-CM | POA: Diagnosis not present

## 2017-10-08 DIAGNOSIS — F419 Anxiety disorder, unspecified: Secondary | ICD-10-CM | POA: Diagnosis not present

## 2017-10-08 DIAGNOSIS — G629 Polyneuropathy, unspecified: Secondary | ICD-10-CM | POA: Diagnosis not present

## 2017-10-08 DIAGNOSIS — I10 Essential (primary) hypertension: Secondary | ICD-10-CM | POA: Diagnosis not present

## 2017-10-08 DIAGNOSIS — Z170 Estrogen receptor positive status [ER+]: Secondary | ICD-10-CM | POA: Diagnosis not present

## 2017-10-09 DIAGNOSIS — D61810 Antineoplastic chemotherapy induced pancytopenia: Secondary | ICD-10-CM | POA: Diagnosis not present

## 2017-10-09 DIAGNOSIS — C9000 Multiple myeloma not having achieved remission: Secondary | ICD-10-CM | POA: Diagnosis not present

## 2017-10-09 DIAGNOSIS — Z9889 Other specified postprocedural states: Secondary | ICD-10-CM | POA: Diagnosis not present

## 2017-10-09 DIAGNOSIS — R112 Nausea with vomiting, unspecified: Secondary | ICD-10-CM | POA: Diagnosis not present

## 2017-10-09 DIAGNOSIS — T451X5A Adverse effect of antineoplastic and immunosuppressive drugs, initial encounter: Secondary | ICD-10-CM | POA: Diagnosis not present

## 2017-10-10 DIAGNOSIS — Z8673 Personal history of transient ischemic attack (TIA), and cerebral infarction without residual deficits: Secondary | ICD-10-CM | POA: Diagnosis not present

## 2017-10-10 DIAGNOSIS — Z853 Personal history of malignant neoplasm of breast: Secondary | ICD-10-CM | POA: Diagnosis not present

## 2017-10-10 DIAGNOSIS — I2510 Atherosclerotic heart disease of native coronary artery without angina pectoris: Secondary | ICD-10-CM | POA: Diagnosis not present

## 2017-10-10 DIAGNOSIS — Z170 Estrogen receptor positive status [ER+]: Secondary | ICD-10-CM | POA: Diagnosis not present

## 2017-10-10 DIAGNOSIS — C9000 Multiple myeloma not having achieved remission: Secondary | ICD-10-CM | POA: Diagnosis not present

## 2017-10-10 DIAGNOSIS — Z79899 Other long term (current) drug therapy: Secondary | ICD-10-CM | POA: Diagnosis not present

## 2017-10-10 DIAGNOSIS — G629 Polyneuropathy, unspecified: Secondary | ICD-10-CM | POA: Diagnosis not present

## 2017-10-10 DIAGNOSIS — F419 Anxiety disorder, unspecified: Secondary | ICD-10-CM | POA: Diagnosis not present

## 2017-10-10 DIAGNOSIS — R112 Nausea with vomiting, unspecified: Secondary | ICD-10-CM | POA: Diagnosis not present

## 2017-10-10 DIAGNOSIS — I10 Essential (primary) hypertension: Secondary | ICD-10-CM | POA: Diagnosis not present

## 2017-10-10 DIAGNOSIS — Z7982 Long term (current) use of aspirin: Secondary | ICD-10-CM | POA: Diagnosis not present

## 2017-10-10 DIAGNOSIS — D61810 Antineoplastic chemotherapy induced pancytopenia: Secondary | ICD-10-CM | POA: Diagnosis not present

## 2017-10-10 DIAGNOSIS — T451X5A Adverse effect of antineoplastic and immunosuppressive drugs, initial encounter: Secondary | ICD-10-CM | POA: Diagnosis not present

## 2017-10-10 DIAGNOSIS — K5900 Constipation, unspecified: Secondary | ICD-10-CM | POA: Diagnosis not present

## 2017-10-11 DIAGNOSIS — C9000 Multiple myeloma not having achieved remission: Secondary | ICD-10-CM | POA: Diagnosis not present

## 2017-10-11 DIAGNOSIS — T451X5A Adverse effect of antineoplastic and immunosuppressive drugs, initial encounter: Secondary | ICD-10-CM | POA: Diagnosis not present

## 2017-10-11 DIAGNOSIS — R112 Nausea with vomiting, unspecified: Secondary | ICD-10-CM | POA: Diagnosis not present

## 2017-10-11 DIAGNOSIS — D61810 Antineoplastic chemotherapy induced pancytopenia: Secondary | ICD-10-CM | POA: Diagnosis not present

## 2017-10-12 DIAGNOSIS — Z8673 Personal history of transient ischemic attack (TIA), and cerebral infarction without residual deficits: Secondary | ICD-10-CM | POA: Diagnosis not present

## 2017-10-12 DIAGNOSIS — D61810 Antineoplastic chemotherapy induced pancytopenia: Secondary | ICD-10-CM | POA: Diagnosis not present

## 2017-10-12 DIAGNOSIS — Z853 Personal history of malignant neoplasm of breast: Secondary | ICD-10-CM | POA: Diagnosis not present

## 2017-10-12 DIAGNOSIS — Z79899 Other long term (current) drug therapy: Secondary | ICD-10-CM | POA: Diagnosis not present

## 2017-10-12 DIAGNOSIS — I10 Essential (primary) hypertension: Secondary | ICD-10-CM | POA: Diagnosis not present

## 2017-10-12 DIAGNOSIS — C9000 Multiple myeloma not having achieved remission: Secondary | ICD-10-CM | POA: Diagnosis not present

## 2017-10-12 DIAGNOSIS — G629 Polyneuropathy, unspecified: Secondary | ICD-10-CM | POA: Diagnosis not present

## 2017-10-12 DIAGNOSIS — F419 Anxiety disorder, unspecified: Secondary | ICD-10-CM | POA: Diagnosis not present

## 2017-10-12 DIAGNOSIS — I2510 Atherosclerotic heart disease of native coronary artery without angina pectoris: Secondary | ICD-10-CM | POA: Diagnosis not present

## 2017-10-14 DIAGNOSIS — Z9484 Stem cells transplant status: Secondary | ICD-10-CM | POA: Diagnosis not present

## 2017-10-14 DIAGNOSIS — C9000 Multiple myeloma not having achieved remission: Secondary | ICD-10-CM | POA: Diagnosis not present

## 2017-10-16 DIAGNOSIS — R630 Anorexia: Secondary | ICD-10-CM | POA: Diagnosis not present

## 2017-10-16 DIAGNOSIS — R110 Nausea: Secondary | ICD-10-CM | POA: Diagnosis not present

## 2017-10-16 DIAGNOSIS — Z170 Estrogen receptor positive status [ER+]: Secondary | ICD-10-CM | POA: Diagnosis not present

## 2017-10-16 DIAGNOSIS — D6959 Other secondary thrombocytopenia: Secondary | ICD-10-CM | POA: Diagnosis present

## 2017-10-16 DIAGNOSIS — R918 Other nonspecific abnormal finding of lung field: Secondary | ICD-10-CM | POA: Diagnosis not present

## 2017-10-16 DIAGNOSIS — I110 Hypertensive heart disease with heart failure: Secondary | ICD-10-CM | POA: Diagnosis not present

## 2017-10-16 DIAGNOSIS — I10 Essential (primary) hypertension: Secondary | ICD-10-CM | POA: Diagnosis present

## 2017-10-16 DIAGNOSIS — R197 Diarrhea, unspecified: Secondary | ICD-10-CM | POA: Diagnosis present

## 2017-10-16 DIAGNOSIS — E876 Hypokalemia: Secondary | ICD-10-CM | POA: Diagnosis not present

## 2017-10-16 DIAGNOSIS — C50922 Malignant neoplasm of unspecified site of left male breast: Secondary | ICD-10-CM | POA: Diagnosis present

## 2017-10-16 DIAGNOSIS — Z9484 Stem cells transplant status: Secondary | ICD-10-CM | POA: Diagnosis not present

## 2017-10-16 DIAGNOSIS — C9000 Multiple myeloma not having achieved remission: Secondary | ICD-10-CM | POA: Diagnosis present

## 2017-10-16 DIAGNOSIS — Z8673 Personal history of transient ischemic attack (TIA), and cerebral infarction without residual deficits: Secondary | ICD-10-CM | POA: Diagnosis not present

## 2017-10-16 DIAGNOSIS — D6481 Anemia due to antineoplastic chemotherapy: Secondary | ICD-10-CM | POA: Diagnosis present

## 2017-10-16 DIAGNOSIS — J90 Pleural effusion, not elsewhere classified: Secondary | ICD-10-CM | POA: Diagnosis not present

## 2017-10-16 DIAGNOSIS — Z452 Encounter for adjustment and management of vascular access device: Secondary | ICD-10-CM | POA: Diagnosis not present

## 2017-10-16 DIAGNOSIS — T865 Complications of stem cell transplant: Secondary | ICD-10-CM | POA: Diagnosis present

## 2017-10-16 DIAGNOSIS — D709 Neutropenia, unspecified: Secondary | ICD-10-CM | POA: Diagnosis present

## 2017-10-16 DIAGNOSIS — R5081 Fever presenting with conditions classified elsewhere: Secondary | ICD-10-CM | POA: Diagnosis present

## 2017-10-16 DIAGNOSIS — T451X5A Adverse effect of antineoplastic and immunosuppressive drugs, initial encounter: Secondary | ICD-10-CM | POA: Diagnosis present

## 2017-10-16 DIAGNOSIS — C9001 Multiple myeloma in remission: Secondary | ICD-10-CM | POA: Diagnosis not present

## 2017-10-22 ENCOUNTER — Other Ambulatory Visit (HOSPITAL_COMMUNITY): Payer: Self-pay | Admitting: *Deleted

## 2017-10-22 DIAGNOSIS — C9000 Multiple myeloma not having achieved remission: Secondary | ICD-10-CM

## 2017-10-25 ENCOUNTER — Encounter (HOSPITAL_COMMUNITY): Payer: Medicare Other

## 2017-10-25 DIAGNOSIS — Z8673 Personal history of transient ischemic attack (TIA), and cerebral infarction without residual deficits: Secondary | ICD-10-CM | POA: Diagnosis not present

## 2017-10-25 DIAGNOSIS — C9000 Multiple myeloma not having achieved remission: Secondary | ICD-10-CM

## 2017-10-25 DIAGNOSIS — D696 Thrombocytopenia, unspecified: Secondary | ICD-10-CM | POA: Diagnosis not present

## 2017-10-25 DIAGNOSIS — K219 Gastro-esophageal reflux disease without esophagitis: Secondary | ICD-10-CM | POA: Diagnosis not present

## 2017-10-25 DIAGNOSIS — Z9221 Personal history of antineoplastic chemotherapy: Secondary | ICD-10-CM | POA: Diagnosis not present

## 2017-10-25 DIAGNOSIS — Z170 Estrogen receptor positive status [ER+]: Secondary | ICD-10-CM | POA: Diagnosis not present

## 2017-10-25 DIAGNOSIS — Z923 Personal history of irradiation: Secondary | ICD-10-CM | POA: Diagnosis not present

## 2017-10-25 DIAGNOSIS — Z79899 Other long term (current) drug therapy: Secondary | ICD-10-CM | POA: Diagnosis not present

## 2017-10-25 DIAGNOSIS — Z7982 Long term (current) use of aspirin: Secondary | ICD-10-CM | POA: Diagnosis not present

## 2017-10-25 DIAGNOSIS — D649 Anemia, unspecified: Secondary | ICD-10-CM | POA: Diagnosis not present

## 2017-10-25 DIAGNOSIS — I10 Essential (primary) hypertension: Secondary | ICD-10-CM | POA: Diagnosis not present

## 2017-10-25 DIAGNOSIS — H3530 Unspecified macular degeneration: Secondary | ICD-10-CM | POA: Diagnosis not present

## 2017-10-25 DIAGNOSIS — Z79810 Long term (current) use of selective estrogen receptor modulators (SERMs): Secondary | ICD-10-CM | POA: Diagnosis not present

## 2017-10-25 DIAGNOSIS — C7951 Secondary malignant neoplasm of bone: Secondary | ICD-10-CM | POA: Diagnosis not present

## 2017-10-25 DIAGNOSIS — C50922 Malignant neoplasm of unspecified site of left male breast: Secondary | ICD-10-CM | POA: Diagnosis not present

## 2017-10-25 LAB — CBC WITH DIFFERENTIAL/PLATELET
BASOS ABS: 0 10*3/uL (ref 0.0–0.1)
BASOS PCT: 1 %
EOS ABS: 0 10*3/uL (ref 0.0–0.7)
EOS PCT: 0 %
HCT: 29.4 % — ABNORMAL LOW (ref 39.0–52.0)
Hemoglobin: 9.8 g/dL — ABNORMAL LOW (ref 13.0–17.0)
Lymphocytes Relative: 17 %
Lymphs Abs: 0.7 10*3/uL (ref 0.7–4.0)
MCH: 31.4 pg (ref 26.0–34.0)
MCHC: 33.3 g/dL (ref 30.0–36.0)
MCV: 94.2 fL (ref 78.0–100.0)
MONO ABS: 0.9 10*3/uL (ref 0.1–1.0)
Monocytes Relative: 25 %
Neutro Abs: 2.2 10*3/uL (ref 1.7–7.7)
Neutrophils Relative %: 58 %
PLATELETS: 59 10*3/uL — AB (ref 150–400)
RBC: 3.12 MIL/uL — AB (ref 4.22–5.81)
RDW: 16.7 % — AB (ref 11.5–15.5)
WBC: 3.8 10*3/uL — AB (ref 4.0–10.5)

## 2017-10-25 LAB — COMPREHENSIVE METABOLIC PANEL
ALBUMIN: 2.9 g/dL — AB (ref 3.5–5.0)
ALT: 17 U/L (ref 17–63)
AST: 26 U/L (ref 15–41)
Alkaline Phosphatase: 32 U/L — ABNORMAL LOW (ref 38–126)
Anion gap: 8 (ref 5–15)
BUN: 13 mg/dL (ref 6–20)
CHLORIDE: 108 mmol/L (ref 101–111)
CO2: 22 mmol/L (ref 22–32)
Calcium: 7.8 mg/dL — ABNORMAL LOW (ref 8.9–10.3)
Creatinine, Ser: 1.15 mg/dL (ref 0.61–1.24)
GFR calc Af Amer: >60 mL/min (ref >60)
GLUCOSE: 123 mg/dL — AB (ref 65–99)
POTASSIUM: 3.9 mmol/L (ref 3.5–5.1)
SODIUM: 138 mmol/L (ref 135–145)
Total Bilirubin: 0.4 mg/dL (ref 0.3–1.2)
Total Protein: 4.9 g/dL — ABNORMAL LOW (ref 6.5–8.1)

## 2017-10-26 ENCOUNTER — Other Ambulatory Visit (HOSPITAL_COMMUNITY): Payer: Medicare Other

## 2017-10-26 LAB — MULTIPLE MYELOMA PANEL, SERUM
ALBUMIN/GLOB SERPL: 1.3 (ref 0.7–1.7)
ALPHA 1: 0.3 g/dL (ref 0.0–0.4)
ALPHA2 GLOB SERPL ELPH-MCNC: 0.7 g/dL (ref 0.4–1.0)
Albumin SerPl Elph-Mcnc: 2.6 g/dL — ABNORMAL LOW (ref 2.9–4.4)
B-GLOBULIN SERPL ELPH-MCNC: 0.6 g/dL — AB (ref 0.7–1.3)
Gamma Glob SerPl Elph-Mcnc: 0.5 g/dL (ref 0.4–1.8)
Globulin, Total: 2.1 g/dL — ABNORMAL LOW (ref 2.2–3.9)
IGG (IMMUNOGLOBIN G), SERUM: 425 mg/dL — AB (ref 700–1600)
IgA: 20 mg/dL — ABNORMAL LOW (ref 61–437)
IgM (Immunoglobulin M), Srm: 36 mg/dL (ref 20–172)
M PROTEIN SERPL ELPH-MCNC: 0.2 g/dL — AB
Total Protein ELP: 4.7 g/dL — ABNORMAL LOW (ref 6.0–8.5)

## 2017-10-26 LAB — KAPPA/LAMBDA LIGHT CHAINS
KAPPA FREE LGHT CHN: 18.3 mg/L (ref 3.3–19.4)
Kappa, lambda light chain ratio: 1.35 (ref 0.26–1.65)
Lambda free light chains: 13.6 mg/L (ref 5.7–26.3)

## 2017-10-26 LAB — BETA 2 MICROGLOBULIN, SERUM: Beta-2 Microglobulin: 4.5 mg/L — ABNORMAL HIGH (ref 0.6–2.4)

## 2017-10-29 ENCOUNTER — Encounter (HOSPITAL_COMMUNITY): Payer: Medicare Other | Attending: Oncology | Admitting: Oncology

## 2017-10-29 ENCOUNTER — Encounter (HOSPITAL_COMMUNITY): Payer: Medicare Other

## 2017-10-29 ENCOUNTER — Other Ambulatory Visit (HOSPITAL_COMMUNITY): Payer: Self-pay

## 2017-10-29 ENCOUNTER — Encounter (HOSPITAL_COMMUNITY): Payer: Self-pay | Admitting: Oncology

## 2017-10-29 VITALS — BP 171/75 | HR 70 | Resp 16 | Ht 68.0 in | Wt 201.0 lb

## 2017-10-29 DIAGNOSIS — C9000 Multiple myeloma not having achieved remission: Secondary | ICD-10-CM | POA: Diagnosis not present

## 2017-10-29 DIAGNOSIS — C50122 Malignant neoplasm of central portion of left male breast: Secondary | ICD-10-CM | POA: Diagnosis not present

## 2017-10-29 DIAGNOSIS — C7951 Secondary malignant neoplasm of bone: Secondary | ICD-10-CM | POA: Diagnosis not present

## 2017-10-29 LAB — COMPREHENSIVE METABOLIC PANEL
ALT: 21 U/L (ref 17–63)
AST: 24 U/L (ref 15–41)
Albumin: 3.3 g/dL — ABNORMAL LOW (ref 3.5–5.0)
Alkaline Phosphatase: 34 U/L — ABNORMAL LOW (ref 38–126)
Anion gap: 8 (ref 5–15)
BILIRUBIN TOTAL: 0.6 mg/dL (ref 0.3–1.2)
BUN: 13 mg/dL (ref 6–20)
CALCIUM: 8.7 mg/dL — AB (ref 8.9–10.3)
CO2: 25 mmol/L (ref 22–32)
CREATININE: 1.21 mg/dL (ref 0.61–1.24)
Chloride: 103 mmol/L (ref 101–111)
GFR, EST NON AFRICAN AMERICAN: 59 mL/min — AB (ref >60)
Glucose, Bld: 105 mg/dL — ABNORMAL HIGH (ref 65–99)
Potassium: 3.7 mmol/L (ref 3.5–5.1)
Sodium: 136 mmol/L (ref 135–145)
TOTAL PROTEIN: 5.1 g/dL — AB (ref 6.5–8.1)

## 2017-10-29 LAB — CBC WITH DIFFERENTIAL/PLATELET
BASOS ABS: 0 10*3/uL (ref 0.0–0.1)
Basophils Relative: 0 %
EOS PCT: 3 %
Eosinophils Absolute: 0.1 10*3/uL (ref 0.0–0.7)
HEMATOCRIT: 32.9 % — AB (ref 39.0–52.0)
Hemoglobin: 10.9 g/dL — ABNORMAL LOW (ref 13.0–17.0)
LYMPHS PCT: 26 %
Lymphs Abs: 1.2 10*3/uL (ref 0.7–4.0)
MCH: 30.9 pg (ref 26.0–34.0)
MCHC: 33.1 g/dL (ref 30.0–36.0)
MCV: 93.2 fL (ref 78.0–100.0)
MONO ABS: 0.5 10*3/uL (ref 0.1–1.0)
Monocytes Relative: 11 %
NEUTROS ABS: 2.7 10*3/uL (ref 1.7–7.7)
Neutrophils Relative %: 60 %
Platelets: 98 10*3/uL — ABNORMAL LOW (ref 150–400)
RBC: 3.53 MIL/uL — AB (ref 4.22–5.81)
RDW: 15.6 % — ABNORMAL HIGH (ref 11.5–15.5)
WBC: 4.6 10*3/uL (ref 4.0–10.5)

## 2017-10-29 NOTE — Progress Notes (Signed)
[address], [place] [address]   CLINIC:  Medical Oncology/Hematology  PCP:  [name], MD [place] [address] [phone]   REASON FOR VISIT:  Follow-up for Stage II multiple myeloma AND Stage IV breast cancer with bone mets   CURRENT THERAPY: carfizolmib/cytoxan/dex and Xgeva every 28 days AND Tamoxifen daily    BRIEF ONCOLOGIC HISTORY:    Breast cancer, male ([place])   12/31/2009 Initial Biopsy    Biopsy of L breast       12/31/2009 Pathology Results    Invasive ductal carcinoma, ER/PR+, HER 2 negative      12/31/2009 Imaging    Ultrasound showing a 2.43 x 1.85 x 3 cm hypoechoic spiculated mass in the 12 o clock L breast retroareolar region      01/01/2010 -  Anti-estrogen oral therapy    Tamoxifen 20 mg daily      01/06/2010 Imaging    Bone scan abnormal uptake in the diaphysis of the R humerus, abnormal in the R third, fifth and sixth ribs, lesion also noted in the sternum.      02/03/2010 Surgery    Rod placement and fixation of R humerus by Dr. [name]      02/05/2010 - 02/18/2010 Radiation Therapy    30Gy in 10 fractions of 3 Gy per fraction to R pathologic fracture      03/11/2010 -  Chemotherapy    Denosumab monthly, now every 3 months. Started at [facility]       06/09/2016 Imaging    Three hypermetabolic osseous lesions in the sternum, left ilium and right ilium, as discussed above, likely represent osseous metastases. At this time, these are not recognizable on the CT images. 2. No extra skeletal metastatic disease identified in the neck, chest, abdomen or pelvis.      10/13/2016 Progression    PET shows various new and enlarging osseous metastatic lesions with no definite extra osseous metastatic disease currently identified.       12/31/2016 Progression    1. Multifocal hypermetabolic osseous metastases throughout the axial and proximal appendicular skeleton, which are increased in size, number and  metabolism since 10/13/2016 PET-CT. 2. 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 3. No additional sites of hypermetabolic metastatic disease. 4. Chronic right mastoid sinusitis. 5. Aortic atherosclerosis.  One vessel coronary atherosclerosis.       Multiple myeloma ([place])   02/12/2017 Bone Marrow Biopsy    The marrow was variably cellular with large peritrabecular aggregates of kappa restricted plasma cells (66% by aspirate, 30% by Cd138). Cytogenetics +11.       03/01/2017 - 06/29/2017 Chemotherapy    RVD       05/26/2017 Bone Marrow Biopsy    Performed at [facility]:  Plasma cell myeloma in a 30% cellularmarrow with decreased trilineage hematopoiesis and 42% kappalight chain restricted plasma cells on the aspirate smears andlarge aggregates on the core biopsy.       07/12/2017 - 09/01/2017 Chemotherapy    3 cycles of carfizolmib/cyclophosphamide/dexamethasone        10/08/2017 Bone Marrow Transplant    Autotransplant at [facility]        HISTORY OF PRESENT ILLNESS:       INTERVAL HISTORY:  Johnny Lucas 71 y.o. male here for routine follow-up for multiple myeloma and Stage IV metastatic breast cancer.  Patient states that he had a very rough time with his autotransplant which he underwent on 11/08/17. He feels very fatigued right now and has no appetite. He states that he is retaining a lot of fluid. He has swelling all over and states he has been urinating a lot. He has not had any recent infections. No recent fevers/chils. He denies any chest pain, shortness of breath, abdominal pain, focal weakness.  REVIEW OF SYSTEMS:  Review of Systems  Constitutional: Positive for fatigue. Negative for appetite change, chills and fever.  HENT:  Negative.  Negative for lump/mass and nosebleeds.   Eyes: Negative.   Respiratory: Negative.   Negative for cough and shortness of breath.   Cardiovascular: Negative.  Negative for chest pain and leg swelling.  Gastrointestinal: Negative.  Negative for abdominal pain, blood in stool, constipation, diarrhea, nausea and vomiting.  Endocrine: Negative.   Genitourinary: Negative.  Negative for dysuria and hematuria.   Musculoskeletal: Negative for arthralgias, back pain and myalgias.  Skin: Negative.  Negative for rash.  Neurological: Positive for numbness. Negative for dizziness and headaches.  Hematological: Negative.  Negative for adenopathy. Does not bruise/bleed easily.  Psychiatric/Behavioral: Negative.  Negative for depression and sleep disturbance. The patient is not nervous/anxious.      PAST MEDICAL/SURGICAL HISTORY:  Past Medical History:  Diagnosis Date  . Anxiety   . Bone metastases ([place]) 09/10/2016  . Breast cancer ([place]) 2011   Stave IV breast cancer; radiation and tamoxifen  . Breast cancer, male ([place])    Stave IV breast cancer; radiation and tamoxifen  Overview:  Left breast ca with mets bone  Overview:  METS TO BONE  . GERD (gastroesophageal reflux disease)   . Hypertension   . Macular degeneration   . Multiple myeloma ([place]) 02/18/2017   Past Surgical History:  Procedure Laterality Date  . BACK SURGERY    . HERNIA REPAIR    . right arm surgery       SOCIAL HISTORY:  Social History   Social History  . Marital status: Married    Spouse name: N/A  . Number of children: 3  . Years of education: N/A   Occupational History  . Not on file.   Social History Main Topics  . Smoking status: Never Smoker  . Smokeless tobacco: Former Systems developer  . Alcohol use 14.4 oz/week    24 Cans of beer per week  . Drug use: No  . Sexual activity: Not on file   Other Topics Concern  . Not on file   Social History Narrative  . No narrative on file    FAMILY HISTORY:  Family History  Problem Relation Age of Onset  . Stroke Mother   . Cancer Maternal Aunt         cancer NOS; died in her 69s  . Lung cancer Maternal Uncle        smoker    CURRENT MEDICATIONS:  Outpatient Encounter Prescriptions as of 10/29/2017  Medication Sig Note  . acyclovir (ZOVIRAX) 400 MG tablet Take 1 tablet (400 mg total) by mouth daily.   Marland Kitchen albuterol (PROVENTIL HFA;VENTOLIN HFA) 108 (90 Base) MCG/ACT inhaler Inhale 2 puffs into the lungs every 6 (six) hours as needed for wheezing or shortness of breath.   . ALPRAZolam (XANAX) 0.5 MG tablet TAKE ONE TABLET BY MOUTH AT BEDTIME AS NEEDED FOR ANXIETY   . ALPRAZolam (XANAX) 0.5 MG tablet 0.5 mg.   . aspirin EC 81 MG tablet Take 162 mg by  mouth daily.  09/10/2016: Received from: [facility]: Take 162 mg by mouth daily.  . Calcium Carb-Cholecalciferol (CALCIUM 500 + D3) 500-600 MG-UNIT TABS Take 2 tablets by mouth daily.   . calcium carbonate (TUMS - DOSED IN MG ELEMENTAL CALCIUM) 500 MG chewable tablet Chew 3 tablets by mouth daily.    . calcium-vitamin D (OSCAL WITH D) 500-200 MG-UNIT TABS tablet TAKE 2 TABLETS BY MOUTH DAILY.   Marland Kitchen Carfilzomib (KYPROLIS IV) Inject into the vein. Day 1 and 2, day 8 and 9, day 15 and 16 every 28 days   . Cholecalciferol (VITAMIN D3) 1000 units CAPS Take by mouth.   . CYCLOPHOSPHAMIDE IV Inject into the vein. Days 1,8,15 every 28 days   . denosumab (XGEVA) 120 MG/1.7ML SOLN injection Inject 120 mg into the skin once. Every 28 days   . dexamethasone (DECADRON) 4 MG tablet Take 10 tablets ('40mg'$ ) on days 1,8,15, and 22 of chemo. Repeat every 28 days.   Marland Kitchen dexamethasone (DECADRON) 4 MG tablet Take 12 mg by mouth.   . ferrous sulfate 325 (65 FE) MG EC tablet Take by mouth.   . ferrous sulfate 325 (65 FE) MG EC tablet Take 1 tablet (325 mg total) by mouth 2 (two) times daily after a meal.   . fluconazole (DIFLUCAN) 200 MG tablet Take 400 mg by mouth.   . gabapentin (NEURONTIN) 300 MG capsule Take two times a day, then increase to three times a day in a few weeks.   Marland Kitchen levofloxacin (LEVAQUIN) 500  MG tablet Take 500 mg by mouth.   . loratadine (CLARITIN) 10 MG tablet Take 1 tablet (10 mg total) by mouth daily. (Patient taking differently: Take 10 mg by mouth daily. Takes as needed.)   . metoprolol succinate (TOPROL-XL) 100 MG 24 hr tablet Take 100 mg by mouth daily.  01/04/2017: Received from: External Pharmacy  . metoprolol succinate (TOPROL-XL) 100 MG 24 hr tablet 50 mg.   . Multiple Vitamins-Minerals (OCUVITE PO) Take by mouth daily.  09/10/2016: Received from: [facility]: Take 1 tablet by mouth daily.  . ondansetron (ZOFRAN) 8 MG tablet Take 1 tablet (8 mg total) by mouth every 8 (eight) hours as needed for nausea or vomiting.   . pantoprazole (PROTONIX) 40 MG tablet Take 40 mg by mouth daily. 03/29/2017: Takes prn  . polyethylene glycol (MIRALAX / GLYCOLAX) packet Take 17 g by mouth daily as needed.   . prochlorperazine (COMPAZINE) 10 MG tablet Take 1 tablet (10 mg total) by mouth every 6 (six) hours as needed for nausea or vomiting.   . sildenafil (REVATIO) 20 MG tablet TAKE UP TO FIVE TABLETS BY MOUTH EVERY DAY AS NEEDED   . tamoxifen (NOLVADEX) 20 MG tablet Take 1 tablet (20 mg total) by mouth daily.   . traMADol (ULTRAM) 50 MG tablet Take 1 tablet (50 mg total) by mouth every 6 (six) hours as needed.    No facility-administered encounter medications on file as of 10/29/2017.     ALLERGIES:  No Known Allergies   PHYSICAL EXAM:  ECOG Performance status: 1 - Symptomatic, but largely independent.   Vitals:   10/29/17 1508  BP: (!) 171/75  Pulse: 70  Resp: 16   Filed Weights   10/29/17 1508  Weight: 201 lb (91.2 kg)    Physical Exam  Constitutional: He is oriented to person, place, and time. No distress.  Looks very weak today. Wearing mask and gloves.  HENT:  Head: Normocephalic.  Mouth/Throat: Oropharynx is clear and moist. No oropharyngeal exudate.  Eyes: Pupils are equal, round, and reactive to light. Conjunctivae are normal. No scleral icterus.    Neck: Normal range of motion. Neck supple.  Cardiovascular: Normal rate and regular rhythm.   No murmur heard. Pulmonary/Chest: Effort normal and breath sounds normal. No respiratory distress. He has no wheezes. He has no rales.  Abdominal: Soft. Bowel sounds are normal. There is no tenderness. There is no rebound and no guarding.  Musculoskeletal: Normal range of motion. He exhibits edema (3+ bilateral LE edema).  Lymphadenopathy:    He has no cervical adenopathy.       Right: No supraclavicular adenopathy present.       Left: No supraclavicular adenopathy present.  Neurological: He is alert and oriented to person, place, and time. No cranial nerve deficit. Gait normal.  Skin: Skin is warm and dry.  Psychiatric: Mood, memory, affect and judgment normal.  Nursing note and vitals reviewed.    LABORATORY DATA:  I have reviewed the labs as listed.  CBC    Component Value Date/Time   WBC 3.8 (L) 10/25/2017 1031   RBC 3.12 (L) 10/25/2017 1031   HGB 9.8 (L) 10/25/2017 1031   HCT 29.4 (L) 10/25/2017 1031   PLT 59 (L) 10/25/2017 1031   MCV 94.2 10/25/2017 1031   MCH 31.4 10/25/2017 1031   MCHC 33.3 10/25/2017 1031   RDW 16.7 (H) 10/25/2017 1031   LYMPHSABS 0.7 10/25/2017 1031   MONOABS 0.9 10/25/2017 1031   EOSABS 0.0 10/25/2017 1031   BASOSABS 0.0 10/25/2017 1031   CMP Latest Ref Rng & Units 10/25/2017 08/31/2017 08/23/2017  Glucose 65 - 99 mg/dL 123(H) 152(H) 99  BUN 6 - 20 mg/dL '13 19 14  '$ Creatinine 0.61 - 1.24 mg/dL 1.15 1.23 1.27(H)  Sodium 135 - 145 mmol/L 138 137 141  Potassium 3.5 - 5.1 mmol/L 3.9 4.0 3.8  Chloride 101 - 111 mmol/L 108 106 109  CO2 22 - 32 mmol/L '22 23 27  '$ Calcium 8.9 - 10.3 mg/dL 7.8(L) 9.0 8.8(L)  Total Protein 6.5 - 8.1 g/dL 4.9(L) 5.7(L) 5.5(L)  Total Bilirubin 0.3 - 1.2 mg/dL 0.4 0.6 0.2(L)  Alkaline Phos 38 - 126 U/L 32(L) 22(L) 22(L)  AST 15 - 41 U/L '26 21 16  '$ ALT 17 - 63 U/L 17 14(L) 10(L)    PENDING LABS:    DIAGNOSTIC IMAGING:  *Images  and radiologic reports reviewed independently and agree with finds as listed below.  PET scan: 12/31/16      Mammogram 04/06/17       PATHOLOGY:  (R) humerus bone path: 01/21/10 (re-evaluated on 04/09/17)     Genetics report: 01/22/14    Bone marrow biopsy: 02/02/17   Cytogenetics report: 02/10/17       ASSESSMENT & PLAN:   IgG kappa multiple myeloma:  -SPEP in 12/2016 revealed M-spike 2.3% with elevated IgG monoclonal protein (3,255 mg/dL) and elevated kappa light chains (2,129.1 mg/L) & kappa/lambda light chain ratio (272.96).  -Bone marrow biopsy on 02/02/17 demonstrated multiple myeloma; evidence of large peritravecular aggregates with kappa restricted plasma cells (66% by aspirate, 30% by Cd138). Cytogenetics +11.   -Started therapy with Revlimid/Velcade/Decadron (RVD) on 03/01/17. To date he has completed up to cycle 6 day 15 with a partial response. He was then switched to carfilzomib, cyclophosphamide, dexamethasone in an attempt to get a deeper response prior to BMT and received 3 cycles of the treatment, and completed cycle 3 day 2 on  09/01/17. Kyprolis was dose reduced by 25% for neuropathy on cycle 3 day 2.  -Underwent auto BMT on 10/08/17.  Post transplant patient continued to have fevers and there was concern for engraftment syndrome and his Prednisone was increased to 40 mg daily x 5 days. -I have reviewed his CBC and CMP today with him. His blood counts are all normalizing.  -Encouraged him to increase PO intake and maintain hydration. -Continue follow up at [facility] [place] as scheduled. -His upcoming visits with [facility] for post transplant follow up is as follows:  11/08/17: with BMT for day +30 eval s/p Autologous stem cell transplant with the following: CDP, CMP, Mg  01/19/18: for day +100 eval s/p Autologous stem cell transplant with the following: Bone Marrow bx -- procedure only with BMT PET scan - Notify Schedulers Labs: SFLC, SPEP, IFIX, Quant Immunoglobulin's, Beta-2  microglobulin, 24 Hr urine, CDP, CMP, Mg, Helper/ Suppressor w/ ratio  01/26/18: with Dr. Rodriguez/ BMT for follow-up visit to discuss test results  04/06/18: with BMT for day +180 eval s/p Autologous stem cell transplant with the following: Labs: SFLC, SPEP, IFIX, Quant Immunoglobulin's, Beta-2 microglobulin, 24 Hr urine, CDP, CMP, Mg, Helper/ Suppressor w/ ratio Tx slot for Vaccines (30 min)  06/29/18: with BMT for day +270 eval s/p Autologous stem cell transplant with the following: Labs: SFLC, SPEP, IFIX, Quant Immunoglobulin's, Beta-2 microglobulin, 24 Hr urine, CDP, CMP, Mg Tx slot for Vaccines (30 min)  10/05/18: with BMT for day 1 year eval s/p Autologous stem cell transplant with the following: Labs: SFLC, SPEP, IFIX, Quant Immunoglobulin's, Beta-2 microglobulin, 24 Hr urine, CDP, CMP, Mg, Helper/ Suppressor w/ ratio Tx slot for Vaccines (30 min)   -Continue all prophylactic antibiotics. Avoid any sick contacts and maintain good hand hygiene. -RTC in 4 weeks for follow up. Standing order for the following labs below q4 weeks.  Orders Placed This Encounter  Procedures  . CBC with Differential    Standing Status:   Standing    Number of Occurrences:   4    Standing Expiration Date:   10/29/2018  . Comprehensive metabolic panel    Standing Status:   Standing    Number of Occurrences:   4    Standing Expiration Date:   10/29/2018  . Multiple Myeloma Panel (SPEP&IFE w/QIG)    Standing Status:   Standing    Number of Occurrences:   4    Standing Expiration Date:   10/29/2018  . Beta 2 microglobuline, serum    Standing Status:   Standing    Number of Occurrences:   4    Standing Expiration Date:   10/29/2018  . Kappa/lambda light chains    Standing Status:   Standing    Number of Occurrences:   4    Standing Expiration Date:   10/29/2018  . Magnesium    Standing Status:   Standing    Number of Occurrences:   4    Standing Expiration Date:   10/29/2018    Hypocalcemia:    -Continue 2000 mg calcium with vitamin D.   Male left breast cancer:  -Mammogram in 03/2017 with reduced size of initial mass in left breast (originally 3 cm, now 1.1 cm). Since his last visit with me, I had our pathology team re-review his (R) humerus bone pathology from 2011. Staining was done which was ER+; there was not enough tissue to do HER2. However, ER+ staining supports metastatic breast cancer. -Genetics report dated 01/11/14: Variant of Unknown Significance, "  [id number]."  -Reviewed with him that while metastatic breast cancer is not curable, it remains treatable.   -He has been off of tamoxifen since his BMT, will re-initiate once ok with his transplant team.    All questions were answered to patient's stated satisfaction. Encouraged patient to call with any new concerns or questions before his next visit to the cancer center and we can certain see him sooner, if needed.    Twana First, MD

## 2017-11-02 ENCOUNTER — Other Ambulatory Visit (HOSPITAL_COMMUNITY): Payer: Self-pay | Admitting: Adult Health

## 2017-11-02 ENCOUNTER — Encounter (HOSPITAL_COMMUNITY): Payer: Medicare Other

## 2017-11-02 DIAGNOSIS — C9000 Multiple myeloma not having achieved remission: Secondary | ICD-10-CM | POA: Diagnosis not present

## 2017-11-02 DIAGNOSIS — C7951 Secondary malignant neoplasm of bone: Secondary | ICD-10-CM | POA: Diagnosis not present

## 2017-11-02 LAB — CBC WITH DIFFERENTIAL/PLATELET
BASOS ABS: 0.1 10*3/uL (ref 0.0–0.1)
BASOS PCT: 2 %
EOS ABS: 0.3 10*3/uL (ref 0.0–0.7)
EOS PCT: 6 %
HCT: 34.3 % — ABNORMAL LOW (ref 39.0–52.0)
Hemoglobin: 11.2 g/dL — ABNORMAL LOW (ref 13.0–17.0)
LYMPHS PCT: 14 %
Lymphs Abs: 0.7 10*3/uL (ref 0.7–4.0)
MCH: 31.3 pg (ref 26.0–34.0)
MCHC: 32.7 g/dL (ref 30.0–36.0)
MCV: 95.8 fL (ref 78.0–100.0)
MONO ABS: 1.1 10*3/uL — AB (ref 0.1–1.0)
Monocytes Relative: 21 %
Neutro Abs: 3 10*3/uL (ref 1.7–7.7)
Neutrophils Relative %: 57 %
PLATELETS: 144 10*3/uL — AB (ref 150–400)
RBC: 3.58 MIL/uL — AB (ref 4.22–5.81)
RDW: 15.8 % — AB (ref 11.5–15.5)
WBC: 5.2 10*3/uL (ref 4.0–10.5)

## 2017-11-02 LAB — COMPREHENSIVE METABOLIC PANEL
ALT: 15 U/L — AB (ref 17–63)
AST: 20 U/L (ref 15–41)
Albumin: 3.4 g/dL — ABNORMAL LOW (ref 3.5–5.0)
Alkaline Phosphatase: 35 U/L — ABNORMAL LOW (ref 38–126)
Anion gap: 9 (ref 5–15)
BUN: 11 mg/dL (ref 6–20)
CHLORIDE: 106 mmol/L (ref 101–111)
CO2: 25 mmol/L (ref 22–32)
Calcium: 8.4 mg/dL — ABNORMAL LOW (ref 8.9–10.3)
Creatinine, Ser: 1.01 mg/dL (ref 0.61–1.24)
GFR calc Af Amer: >60 mL/min (ref >60)
Glucose, Bld: 101 mg/dL — ABNORMAL HIGH (ref 65–99)
POTASSIUM: 3.6 mmol/L (ref 3.5–5.1)
SODIUM: 140 mmol/L (ref 135–145)
Total Bilirubin: 0.5 mg/dL (ref 0.3–1.2)
Total Protein: 5.5 g/dL — ABNORMAL LOW (ref 6.5–8.1)

## 2017-11-02 LAB — MAGNESIUM: MAGNESIUM: 1.8 mg/dL (ref 1.7–2.4)

## 2017-11-03 LAB — KAPPA/LAMBDA LIGHT CHAINS
KAPPA FREE LGHT CHN: 13.4 mg/L (ref 3.3–19.4)
KAPPA, LAMDA LIGHT CHAIN RATIO: 1.54 (ref 0.26–1.65)
Lambda free light chains: 8.7 mg/L (ref 5.7–26.3)

## 2017-11-03 LAB — BETA 2 MICROGLOBULIN, SERUM: BETA 2 MICROGLOBULIN: 1.3 mg/L (ref 0.6–2.4)

## 2017-11-04 ENCOUNTER — Encounter (HOSPITAL_COMMUNITY): Payer: Medicare Other

## 2017-11-04 ENCOUNTER — Other Ambulatory Visit (HOSPITAL_COMMUNITY): Payer: Self-pay

## 2017-11-04 DIAGNOSIS — C9000 Multiple myeloma not having achieved remission: Secondary | ICD-10-CM

## 2017-11-04 DIAGNOSIS — C7951 Secondary malignant neoplasm of bone: Secondary | ICD-10-CM

## 2017-11-04 LAB — COMPREHENSIVE METABOLIC PANEL
ALBUMIN: 3.2 g/dL — AB (ref 3.5–5.0)
ALK PHOS: 34 U/L — AB (ref 38–126)
ALT: 13 U/L — ABNORMAL LOW (ref 17–63)
ANION GAP: 6 (ref 5–15)
AST: 25 U/L (ref 15–41)
BILIRUBIN TOTAL: 0.8 mg/dL (ref 0.3–1.2)
BUN: 9 mg/dL (ref 6–20)
CALCIUM: 8.1 mg/dL — AB (ref 8.9–10.3)
CO2: 25 mmol/L (ref 22–32)
Chloride: 104 mmol/L (ref 101–111)
Creatinine, Ser: 0.95 mg/dL (ref 0.61–1.24)
GFR calc non Af Amer: >60 mL/min (ref >60)
GLUCOSE: 94 mg/dL (ref 65–99)
Potassium: 4 mmol/L (ref 3.5–5.1)
Sodium: 135 mmol/L (ref 135–145)
TOTAL PROTEIN: 5.4 g/dL — AB (ref 6.5–8.1)

## 2017-11-04 LAB — CBC WITH DIFFERENTIAL/PLATELET
Basophils Absolute: 0.1 10*3/uL (ref 0.0–0.1)
Basophils Relative: 1 %
Eosinophils Absolute: 0.3 10*3/uL (ref 0.0–0.7)
Eosinophils Relative: 5 %
HEMATOCRIT: 31.7 % — AB (ref 39.0–52.0)
HEMOGLOBIN: 10.3 g/dL — AB (ref 13.0–17.0)
LYMPHS ABS: 1.1 10*3/uL (ref 0.7–4.0)
LYMPHS PCT: 17 %
MCH: 31.3 pg (ref 26.0–34.0)
MCHC: 32.5 g/dL (ref 30.0–36.0)
MCV: 96.4 fL (ref 78.0–100.0)
MONOS PCT: 18 %
Monocytes Absolute: 1.2 10*3/uL — ABNORMAL HIGH (ref 0.1–1.0)
NEUTROS ABS: 3.8 10*3/uL (ref 1.7–7.7)
NEUTROS PCT: 59 %
Platelets: 119 10*3/uL — ABNORMAL LOW (ref 150–400)
RBC: 3.29 MIL/uL — ABNORMAL LOW (ref 4.22–5.81)
RDW: 15.2 % (ref 11.5–15.5)
WBC: 6.5 10*3/uL (ref 4.0–10.5)

## 2017-11-04 LAB — MAGNESIUM: Magnesium: 1.8 mg/dL (ref 1.7–2.4)

## 2017-11-04 LAB — MULTIPLE MYELOMA PANEL, SERUM
ALBUMIN SERPL ELPH-MCNC: 3.3 g/dL (ref 2.9–4.4)
ALPHA 1: 0.3 g/dL (ref 0.0–0.4)
ALPHA2 GLOB SERPL ELPH-MCNC: 0.6 g/dL (ref 0.4–1.0)
Albumin/Glob SerPl: 1.6 (ref 0.7–1.7)
B-GLOBULIN SERPL ELPH-MCNC: 0.7 g/dL (ref 0.7–1.3)
GAMMA GLOB SERPL ELPH-MCNC: 0.5 g/dL (ref 0.4–1.8)
GLOBULIN, TOTAL: 2.1 g/dL — AB (ref 2.2–3.9)
IGG (IMMUNOGLOBIN G), SERUM: 459 mg/dL — AB (ref 700–1600)
IgA: 23 mg/dL — ABNORMAL LOW (ref 61–437)
IgM (Immunoglobulin M), Srm: 37 mg/dL (ref 20–172)
M PROTEIN SERPL ELPH-MCNC: 0.3 g/dL — AB
Total Protein ELP: 5.4 g/dL — ABNORMAL LOW (ref 6.0–8.5)

## 2017-11-04 MED ORDER — ALPRAZOLAM 0.5 MG PO TABS: 0.5000 mg | ORAL_TABLET | Freq: Every evening | ORAL | 1 refills | Status: DC | PRN

## 2017-11-04 NOTE — Telephone Encounter (Signed)
Received refill request from patients pharmacy for Xanax. Reviewed with provider, chart checked and refilled.

## 2017-11-05 ENCOUNTER — Telehealth (HOSPITAL_COMMUNITY): Payer: Self-pay

## 2017-11-05 LAB — KAPPA/LAMBDA LIGHT CHAINS
KAPPA FREE LGHT CHN: 14.1 mg/L (ref 3.3–19.4)
KAPPA, LAMDA LIGHT CHAIN RATIO: 2.2 — AB (ref 0.26–1.65)
Lambda free light chains: 6.4 mg/L (ref 5.7–26.3)

## 2017-11-05 LAB — BETA 2 MICROGLOBULIN, SERUM: BETA 2 MICROGLOBULIN: 2.2 mg/L (ref 0.6–2.4)

## 2017-11-05 NOTE — Telephone Encounter (Signed)
Nutrition  Patient identified on Malnutrition Screening Report for weight loss and poor appetite  Spoke with patient via phone today and agreeable to seeing RD at next clinic visit on 11/30.  Nutrition appointment made for that day.  Shawnita Krizek B. Zenia Resides, [place], [place] Registered Dietitian [phone] (pager)

## 2017-11-08 DIAGNOSIS — Z9484 Stem cells transplant status: Secondary | ICD-10-CM | POA: Diagnosis not present

## 2017-11-08 DIAGNOSIS — Z170 Estrogen receptor positive status [ER+]: Secondary | ICD-10-CM | POA: Diagnosis not present

## 2017-11-08 DIAGNOSIS — R110 Nausea: Secondary | ICD-10-CM | POA: Diagnosis not present

## 2017-11-08 DIAGNOSIS — C50929 Malignant neoplasm of unspecified site of unspecified male breast: Secondary | ICD-10-CM | POA: Diagnosis not present

## 2017-11-08 DIAGNOSIS — C9000 Multiple myeloma not having achieved remission: Secondary | ICD-10-CM | POA: Diagnosis not present

## 2017-11-09 LAB — MULTIPLE MYELOMA PANEL, SERUM
IgG (Immunoglobin G), Serum: UNDETERMINED mg/dL
TOTAL PROTEIN ELP: 5.1 g/dL — AB (ref 6.0–8.5)

## 2017-11-10 ENCOUNTER — Other Ambulatory Visit (HOSPITAL_COMMUNITY): Payer: Medicare Other

## 2017-11-17 ENCOUNTER — Ambulatory Visit (HOSPITAL_COMMUNITY): Payer: Medicare Other

## 2017-11-26 ENCOUNTER — Encounter (HOSPITAL_COMMUNITY): Payer: Medicare Other

## 2017-11-26 ENCOUNTER — Encounter (HOSPITAL_BASED_OUTPATIENT_CLINIC_OR_DEPARTMENT_OTHER): Payer: Medicare Other | Admitting: Adult Health

## 2017-11-26 ENCOUNTER — Other Ambulatory Visit: Payer: Self-pay

## 2017-11-26 ENCOUNTER — Encounter (HOSPITAL_COMMUNITY): Payer: Self-pay | Admitting: Adult Health

## 2017-11-26 VITALS — BP 135/71 | HR 73 | Resp 18 | Ht 68.0 in | Wt 186.0 lb

## 2017-11-26 DIAGNOSIS — C50122 Malignant neoplasm of central portion of left male breast: Secondary | ICD-10-CM

## 2017-11-26 DIAGNOSIS — G620 Drug-induced polyneuropathy: Secondary | ICD-10-CM

## 2017-11-26 DIAGNOSIS — R197 Diarrhea, unspecified: Secondary | ICD-10-CM | POA: Diagnosis not present

## 2017-11-26 DIAGNOSIS — C9000 Multiple myeloma not having achieved remission: Secondary | ICD-10-CM

## 2017-11-26 DIAGNOSIS — Z9484 Stem cells transplant status: Secondary | ICD-10-CM

## 2017-11-26 DIAGNOSIS — M7989 Other specified soft tissue disorders: Secondary | ICD-10-CM

## 2017-11-26 DIAGNOSIS — Z9884 Bariatric surgery status: Secondary | ICD-10-CM

## 2017-11-26 DIAGNOSIS — C7951 Secondary malignant neoplasm of bone: Secondary | ICD-10-CM | POA: Diagnosis not present

## 2017-11-26 DIAGNOSIS — D649 Anemia, unspecified: Secondary | ICD-10-CM

## 2017-11-26 LAB — COMPREHENSIVE METABOLIC PANEL
ALT: 46 U/L (ref 17–63)
AST: 33 U/L (ref 15–41)
Albumin: 3.6 g/dL (ref 3.5–5.0)
Alkaline Phosphatase: 35 U/L — ABNORMAL LOW (ref 38–126)
Anion gap: 6 (ref 5–15)
BILIRUBIN TOTAL: 0.2 mg/dL — AB (ref 0.3–1.2)
BUN: 12 mg/dL (ref 6–20)
CO2: 25 mmol/L (ref 22–32)
CREATININE: 1.4 mg/dL — AB (ref 0.61–1.24)
Calcium: 8.8 mg/dL — ABNORMAL LOW (ref 8.9–10.3)
Chloride: 106 mmol/L (ref 101–111)
GFR calc Af Amer: 58 mL/min — ABNORMAL LOW (ref >60)
GFR, EST NON AFRICAN AMERICAN: 50 mL/min — AB (ref >60)
Glucose, Bld: 97 mg/dL (ref 65–99)
Potassium: 4 mmol/L (ref 3.5–5.1)
Sodium: 137 mmol/L (ref 135–145)
TOTAL PROTEIN: 6.1 g/dL — AB (ref 6.5–8.1)

## 2017-11-26 LAB — MAGNESIUM: Magnesium: 2 mg/dL (ref 1.7–2.4)

## 2017-11-26 LAB — CBC WITH DIFFERENTIAL/PLATELET
BASOS ABS: 0.1 10*3/uL (ref 0.0–0.1)
Basophils Relative: 2 %
Eosinophils Absolute: 0.3 10*3/uL (ref 0.0–0.7)
Eosinophils Relative: 8 %
HEMATOCRIT: 29.7 % — AB (ref 39.0–52.0)
Hemoglobin: 9.5 g/dL — ABNORMAL LOW (ref 13.0–17.0)
LYMPHS ABS: 0.8 10*3/uL (ref 0.7–4.0)
LYMPHS PCT: 19 %
MCH: 30.7 pg (ref 26.0–34.0)
MCHC: 32 g/dL (ref 30.0–36.0)
MCV: 96.1 fL (ref 78.0–100.0)
MONO ABS: 0.6 10*3/uL (ref 0.1–1.0)
Monocytes Relative: 14 %
NEUTROS ABS: 2.5 10*3/uL (ref 1.7–7.7)
Neutrophils Relative %: 57 %
Platelets: 197 10*3/uL (ref 150–400)
RBC: 3.09 MIL/uL — AB (ref 4.22–5.81)
RDW: 15.5 % (ref 11.5–15.5)
WBC: 4.4 10*3/uL (ref 4.0–10.5)

## 2017-11-26 NOTE — Progress Notes (Signed)
[place], [place] [address]   CLINIC:  Medical Oncology/Hematology  PCP:  [name], MD [place] [address] [phone]   REASON FOR VISIT:  Follow-up for Stage II multiple myeloma AND Stage IV breast cancer with bone mets   CURRENT THERAPY: Post-stem cell transplant care/Surveillance AND Tamoxifen daily    BRIEF ONCOLOGIC HISTORY:    Breast cancer, male ([facility])   12/31/2009 Initial Biopsy    Biopsy of L breast       12/31/2009 Pathology Results    Invasive ductal carcinoma, ER/PR+, HER 2 negative      12/31/2009 Imaging    Ultrasound showing a 2.43 x 1.85 x 3 cm hypoechoic spiculated mass in the 12 o clock L breast retroareolar region      01/01/2010 -  Anti-estrogen oral therapy    Tamoxifen 20 mg daily      01/06/2010 Imaging    Bone scan abnormal uptake in the diaphysis of the R humerus, abnormal in the R third, fifth and sixth ribs, lesion also noted in the sternum.      02/03/2010 Surgery    Rod placement and fixation of R humerus by Dr. [name]      02/05/2010 - 02/18/2010 Radiation Therapy    30Gy in 10 fractions of 3 Gy per fraction to R pathologic fracture      03/11/2010 -  Chemotherapy    Denosumab monthly, now every 3 months. Started at [facility]       06/09/2016 Imaging    Three hypermetabolic osseous lesions in the sternum, left ilium and right ilium, as discussed above, likely represent osseous metastases. At this time, these are not recognizable on the CT images. 2. No extra skeletal metastatic disease identified in the neck, chest, abdomen or pelvis.      10/13/2016 Progression    PET shows various new and enlarging osseous metastatic lesions with no definite extra osseous metastatic disease currently identified.       12/31/2016 Progression    1. Multifocal hypermetabolic osseous metastases throughout the axial and proximal appendicular skeleton, which are increased in size, number and metabolism  since 10/13/2016 PET-CT. 2. 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 3. No additional sites of hypermetabolic metastatic disease. 4. Chronic right mastoid sinusitis. 5. Aortic atherosclerosis.  One vessel coronary atherosclerosis.       Multiple myeloma (Johnny Lucas)   02/12/2017 Bone Marrow Biopsy    The marrow was variably cellular with large peritrabecular aggregates of kappa restricted plasma cells (66% by aspirate, 30% by Cd138). Cytogenetics +11.       03/01/2017 - 06/29/2017 Chemotherapy    RVD       05/26/2017 Bone Marrow Biopsy    Performed at [facility]:  Plasma cell myeloma in a 30% cellularmarrow with decreased trilineage hematopoiesis and 42% kappalight chain restricted plasma cells on the aspirate smears andlarge aggregates on the core biopsy.       07/12/2017 - 09/01/2017 Chemotherapy    3 cycles of carfizolmib/cyclophosphamide/dexamethasone        10/08/2017 Bone Marrow Transplant    Autotransplant at [facility]        HISTORY OF PRESENT ILLNESS:  (From Dr. Laverle Patter note on 02/18/17)      INTERVAL HISTORY:  Johnny Lucas 71 y.o. male here for routine follow-up for multiple myeloma and Stage  IV metastatic breast cancer.   S/p autologous stem cell transplant at [facility] on 10/08/17.  Post-transplant course was complicated by neutropenic fever. Last visit with transplant team was on 11/08/17 with Walden Field, NP.   Here today with his wife.   Overall, he tells me he is feeling "pretty good. I feel like I'm at about 50%."  He tells me that he feels like he is continuing to recover well from transplant. "Everybody keeps telling me that what I'm feeling is normal and what I'm supposed to feel at this point after transplant."  He continues to have some nausea; he is taking Zofran and Compazine, generally  requiring 1 dose per day.  He has some leg swelling; he admittedly tells me that he does not feel like he drinks enough water.  He continues to feel weak at times, but continues to remain as active as possible in rebuilding his strength.  Endorses peripheral neuropathy to his feet and easy bruising.  Reports periodic episodes of bright red blood per rectum after hard stools; he sometimes struggles with constipation "and I have a tear that opens up and bleeds sometimes."  States that this does not happen frequently.    He is taking his prophylactic Bactrim and Valtrex as prescribed by transplant team.  Denies any fever/chills, rash, or diarrhea.  He tells me that he had pretty severe diarrhea immediately after transplant, but this has improved.  His appetite is improving, but he continues to struggle with some foods. He is trying to eat a variety of foods that he can tolerate.  He is scheduled to see our dietitian today as well.   He asks about his flu vaccine; I will touch base with our PharmD to see if flu vaccine can be given at this time.      REVIEW OF SYSTEMS:  Review of Systems  Constitutional: Positive for appetite change and fatigue. Negative for chills and fever.  HENT:  Negative.  Negative for mouth sores.   Eyes: Negative.   Respiratory: Negative.  Negative for cough and shortness of breath.   Cardiovascular: Positive for leg swelling.  Gastrointestinal: Positive for nausea. Negative for abdominal pain, constipation, diarrhea and vomiting.       Intermittent rectal bleeding ("it has been going on for a long time before the transplant")  Endocrine: Negative.   Genitourinary: Negative.  Negative for dysuria and hematuria.   Skin: Negative.  Negative for rash.  Neurological: Positive for extremity weakness and numbness.  Hematological: Bruises/bleeds easily.  Psychiatric/Behavioral: Negative.      PAST MEDICAL/SURGICAL HISTORY:  Past Medical History:  Diagnosis Date  . Anxiety     . Bone metastases ([place]) 09/10/2016  . Breast cancer ([place]) 2011   Stave IV breast cancer; radiation and tamoxifen  . Breast cancer, male ([place])    Stave IV breast cancer; radiation and tamoxifen  Overview:  Left breast ca with mets bone  Overview:  METS TO BONE  . GERD (gastroesophageal reflux disease)   . Hypertension   . Macular degeneration   . Multiple myeloma ([place]) 02/18/2017   Past Surgical History:  Procedure Laterality Date  . BACK SURGERY    . HERNIA REPAIR    . right arm surgery       SOCIAL HISTORY:  Social History   Socioeconomic History  . Marital status: Married    Spouse name: Not on file  . Number of children: 3  . Years of education: Not on  file  . Highest education level: Not on file  Social Needs  . Financial resource strain: Not on file  . Food insecurity - worry: Not on file  . Food insecurity - inability: Not on file  . Transportation needs - medical: Not on file  . Transportation needs - non-medical: Not on file  Occupational History  . Not on file  Tobacco Use  . Smoking status: Never Smoker  . Smokeless tobacco: Former Network engineer and Sexual Activity  . Alcohol use: Yes    Alcohol/week: 14.4 oz    Types: 24 Cans of beer per week  . Drug use: No  . Sexual activity: Not on file  Other Topics Concern  . Not on file  Social History Narrative  . Not on file    FAMILY HISTORY:  Family History  Problem Relation Age of Onset  . Stroke Mother   . Cancer Maternal Aunt        cancer NOS; died in her 51s  . Lung cancer Maternal Uncle        smoker    CURRENT MEDICATIONS:  Outpatient Encounter Medications as of 11/26/2017  Medication Sig Note  . acyclovir (ZOVIRAX) 400 MG tablet Take 1 tablet (400 mg total) by mouth daily.   Marland Kitchen albuterol (PROVENTIL HFA;VENTOLIN HFA) 108 (90 Base) MCG/ACT inhaler Inhale 2 puffs into the lungs every 6 (six) hours as needed for wheezing or shortness of breath.   . ALPRAZolam (XANAX) 0.5 MG tablet Take 1  tablet (0.5 mg total) at bedtime as needed by mouth for anxiety.   Marland Kitchen aspirin EC 81 MG tablet Take 162 mg by mouth daily.  09/10/2016: Received from: [facility]: Take 162 mg by mouth daily.  . calcium-vitamin D (OSCAL WITH D) 500-200 MG-UNIT TABS tablet TAKE 2 TABLETS BY MOUTH DAILY.   . ferrous sulfate 325 (65 FE) MG EC tablet Take 1 tablet (325 mg total) by mouth 2 (two) times daily after a meal.   . folic acid (FOLVITE) 1 MG tablet Take 1 mg by mouth.   . gabapentin (NEURONTIN) 300 MG capsule Take two times a day, then increase to three times a day in a few weeks.   Marland Kitchen loperamide (IMODIUM) 2 MG capsule Take 1 capsule (2 mg total) by mouth after each loose stool (max '16mg'$  per day).   . metoprolol succinate (TOPROL-XL) 100 MG 24 hr tablet 50 mg.   . Multiple Vitamins-Minerals (OCUVITE PO) Take by mouth daily.  09/10/2016: Received from: [facility]: Take 1 tablet by mouth daily.  . ondansetron (ZOFRAN) 8 MG tablet Take 1 tablet (8 mg total) by mouth every 8 (eight) hours as needed for nausea or vomiting.   . pantoprazole (PROTONIX) 40 MG tablet Take 40 mg by mouth daily. 03/29/2017: Takes prn  . polyethylene glycol (MIRALAX / GLYCOLAX) packet Take 17 g by mouth daily as needed.   . prochlorperazine (COMPAZINE) 10 MG tablet Take 1 tablet (10 mg total) by mouth every 6 (six) hours as needed for nausea or vomiting.   . Skin Protectants, Misc. (EUCERIN) cream Apply topically.   . tamoxifen (NOLVADEX) 20 MG tablet TAKE 1 TABLET EVERY DAY    No facility-administered encounter medications on file as of 11/26/2017.     ALLERGIES:  No Known Allergies   PHYSICAL EXAM:  ECOG Performance status: 1 - Symptomatic, but largely independent.   Vitals:   11/26/17 1509  BP: 135/71  Pulse: 73  Resp: 18  SpO2: 97%   Filed Weights   11/26/17 1509  Weight: 186 lb (84.4 kg)      Physical Exam  Constitutional: He is oriented to person, place, and time and well-developed,  well-nourished, and in no distress.  HENT:  Head: Normocephalic.  Mouth/Throat: Oropharynx is clear and moist. No oropharyngeal exudate.  Eyes: Conjunctivae are normal. Pupils are equal, round, and reactive to light. No scleral icterus.  Neck: Normal range of motion. Neck supple.  Cardiovascular: Normal rate and regular rhythm.  Pulmonary/Chest: Effort normal and breath sounds normal. No respiratory distress. He has no wheezes.  Abdominal: Soft. Bowel sounds are normal. There is no tenderness.  Musculoskeletal: Normal range of motion. He exhibits edema (Trace-1+ bilateral ankle pitting edema).  Lymphadenopathy:    He has no cervical adenopathy.       Right: No supraclavicular adenopathy present.       Left: No supraclavicular adenopathy present.  Neurological: He is alert and oriented to person, place, and time. No cranial nerve deficit. Gait normal.  Skin: Skin is warm and dry. No rash noted.  Psychiatric: Mood, memory, affect and judgment normal.  Nursing note and vitals reviewed.    LABORATORY DATA:  I have reviewed the labs as listed.  CBC    Component Value Date/Time   WBC 4.4 11/26/2017 1424   RBC 3.09 (L) 11/26/2017 1424   HGB 9.5 (L) 11/26/2017 1424   HCT 29.7 (L) 11/26/2017 1424   PLT 197 11/26/2017 1424   MCV 96.1 11/26/2017 1424   MCH 30.7 11/26/2017 1424   MCHC 32.0 11/26/2017 1424   RDW 15.5 11/26/2017 1424   LYMPHSABS 0.8 11/26/2017 1424   MONOABS 0.6 11/26/2017 1424   EOSABS 0.3 11/26/2017 1424   BASOSABS 0.1 11/26/2017 1424   CMP Latest Ref Rng & Units 11/26/2017 11/04/2017 11/02/2017  Glucose 65 - 99 mg/dL 97 94 101(H)  BUN 6 - 20 mg/dL '12 9 11  '$ Creatinine 0.61 - 1.24 mg/dL 1.40(H) 0.95 1.01  Sodium 135 - 145 mmol/L 137 135 140  Potassium 3.5 - 5.1 mmol/L 4.0 4.0 3.6  Chloride 101 - 111 mmol/L 106 104 106  CO2 22 - 32 mmol/L '25 25 25  '$ Calcium 8.9 - 10.3 mg/dL 8.8(L) 8.1(L) 8.4(L)  Total Protein 6.5 - 8.1 g/dL 6.1(L) 5.4(L) 5.5(L)  Total Bilirubin 0.3 -  1.2 mg/dL 0.2(L) 0.8 0.5  Alkaline Phos 38 - 126 U/L 35(L) 34(L) 35(L)  AST 15 - 41 U/L 33 25 20  ALT 17 - 63 U/L 46 13(L) 15(L)    PENDING LABS:    DIAGNOSTIC IMAGING:  *Images and radiologic reports reviewed independently and agree with finds as listed below.  PET scan: 12/31/16      Mammogram 04/06/17       PATHOLOGY:  (R) humerus bone path: 01/21/10 (re-evaluated on 04/09/17)     Genetics report: 01/22/14    Bone marrow biopsy: 02/02/17   Cytogenetics report: 02/10/17        ASSESSMENT & PLAN:   IgG kappa multiple myeloma:  -SPEP in 12/2016 revealed M-spike 2.3% with elevated IgG monoclonal protein (3,255 mg/dL) and elevated kappa light chains (2,129.1 mg/L) & kappa/lambda light chain ratio (272.96). Bone marrow biopsy on 02/02/17 demonstrated multiple myeloma; evidence of large peritravecular aggregates with kappa restricted plasma cells (66% by aspirate, 30% by Cd138). Cytogenetics +11.  Was previously on Revlimid/Velcade/Decadron from 03/01/17-06/29/17.  SPEP with good response to therapy with decrease seen in M-spike, kappa light chain, and kappa/lambda light chain  ratio. Patient saw Dr. Norma Fredrickson at [facility] for evaluation for stem cell transplant.  He recommended change of therapy to Carfilzomib/Cyclophosphamide/Decadron to attempt to achieve deeper response before moving forward with stem cell transplant. He completed 2 cycles of this therapy and proceeded with autologous stem cell transplant on 10/08/17.   -Today is day +49 s/p autologous stem cell transplant.  He is scheduled to return to [facility] for day +100 post-transplant evaluation in the coming weeks (mid-January 2019).  -Myeloma labs are pending for today. The values will likely fluctuate in the first few months post-transplant.  -Continue Bactrim 3x/week for PCP prophylaxis; continue Valtrex for varicella zoster/herpes simplex prophylaxis.  -Post-transplant vaccinations will start 6 months after transplant.   Unclear if we can give annual flu vaccine; our PharmD is checking on this for [place]. If he is able, we will administer at his follow-up visit in 1 month.  Explained to Mr. Severns that generally his initial post-transplant childhood vaccines are initially administered at [facility] at his 10-monthfollow-up visit. However, after that time, we are happy to help with the remainder of his vaccinations for pt's convenience, if this is agreeable to his transplant team.  -Hgb 9.5 g/dL today. Clinically, he is feeling well. Denies any shortness of breath or chest pain. He reports very sporadic/intermittent bright red blood per rectum with hard stools, which sounds like a possible anal/rectal fissure.  Encouraged him to let [place] if he continues to have rectal bleeding.  No other reported bleeding episodes noted.  I will add-on anemia panel, including iron studies, to his blood work at next follow-up in 1 month.   -WBCs and platelets are engrafted and normal today.   -Recommended he continue infection prevention precautions as directed by transplant team.   -Return to cancer center in 1 month for follow-up to ensure he is continuing to recover well post-transplant. After that visit, he will see his transplant team in 12/2017 for day +100 evaluation and follow-up. We will remain on stand-by for maintenance therapy recommendations from Dr. RRossie Muskratteam after that time.    Bone mets:  -Based on recommendations from stem cell transplant team, we will plan to resume bisphosphonate therapy after day +60.  Recommend continuation of therapy for 2 years post-transplant as long as kidney function allows.  -Will resume Xgeva monthly injections in 11/2017 at his next follow-up visit. He agrees with this plan.    Metastatic male left breast cancer with bone mets:  -Mammogram in 03/2017 with reduced size of initial mass in left breast (originally 3 cm, now 1.1 cm). (R) humerus bone pathology from 2011. Staining was done which  was ER+; there was not enough tissue to do HER2. However, ER+ staining supports metastatic breast cancer. -Genetics report dated 01/11/14: Variant of Unknown Significance, "[id number]."  -He understands that while metastatic breast cancer is not curable, it remains treatable.   -He has resumed his Tamoxifen (which was recommended he resume 1 month post-transplant). He is tolerating well.    Nutrition:  -Encouraged him to continue to eat a variety of foods, particularly those high in protein.  He will see our dietitian, JJennet Maduro RD, today for additional support and recommendations. Please see her documentation for additional recommendations.       Dispo:  -Return to cancer center in 1 month for follow-up with labs.    All questions were answered to patient's stated satisfaction. Encouraged patient to call with any new concerns or questions before his next visit to the cancer  center and we can certain see him sooner, if needed.     Orders placed this encounter:  Orders Placed This Encounter  Procedures  . Vitamin B12  . Folate  . Iron and TIBC  . Ferritin      Mike Craze, NP [place] [phone]

## 2017-11-26 NOTE — Progress Notes (Signed)
Nutrition Assessment   Reason for Assessment:   Patient identified on Malnutrition Screening report for weight loss and poor appetite  ASSESSMENT:  71 year old male with stage IV breast cancer with bone mets and multiple myeloma s/p stem cell transplant at [facility].  Past medical history of GERD, HTN  Met with patient and wife following clinic visit.  Patient reports that his taste buds have been "off" but he is slowly starting to get them back.  Reports that he has been drinking pasturized eggnog, soup with crackers, cheese and crackers, some meat (fried chicken last night), potatoes and corn.  Usually has cereal or bagel with jelly for breakfast (no taste for eggs lately but likes them).  Also has been eating ice cream.  Has cut back on drinking boost due to drinking eggnog.  Reports takes 1 pil for nausea daily. Reports some issues with constipation but taking stool softners.    Nutrition Focused Physical Exam: deferred, patient fully clothed  Medications: Calcium and Vit D, decadron, ferrous sulfate, folic acid, MVI, zofran, miralax  Labs: reviewed  Anthropometrics:   Height: 68 inches Weight: 186 lb today UBW: 200s BMI: 28  Weight on 11/2 201 pounds, 7% weight loss in the last month, significant  Patient please with weight loss and wants to stay around 180 pounds  Estimated Energy Needs  Kcals: 2500 calories/d Protein: 126 g/d Fluid: 2.5 L/d  NUTRITION DIAGNOSIS: Inadequate oral intake related to cancer related treatment effects as evidenced by 7% weight loss in the last month and eating < 50% of energy needs for > or equal to 5 days   INTERVENTION:   Discussed strategies to increase calories and protein. Fact sheet given to patient and wife Discussed difference in oral nutrition supplements. Samples given along with coupons and recipe book. Reviewed food safety guidelines with patient and wife and they feel comfortable with guidelines and deny needing written  information at this time.     MONITORING, EVALUATION, GOAL: patient will consume adequate calories and protein to meet nutritional needs and prevent further weight loss   NEXT VISIT: December 28 following MD appt  Jobie Popp B. Zenia Resides, [place], [place] Registered Dietitian [phone] (pager)

## 2017-11-26 NOTE — Patient Instructions (Signed)
[facility] Lucas at [facility] Discharge Instructions  RECOMMENDATIONS MADE BY THE CONSULTANT AND ANY TEST RESULTS WILL BE SENT TO YOUR REFERRING PHYSICIAN.  You were seen today by Mike Craze, NP Follow up in 1 month with labs and xgeva injection We will check to see if you can get your flu shot next visit. See schedulers up front for appointments   Thank you for choosing [place] at [facility] to provide your oncology and hematology care.  To afford each patient quality time with our provider, please arrive at least 15 minutes before your scheduled appointment time.    If you have a lab appointment with the [facility] please come in thru the  Main Entrance and check in at the main information desk  You need to re-schedule your appointment should you arrive 10 or more minutes late.  We strive to give you quality time with our providers, and arriving late affects you and other patients whose appointments are after yours.  Also, if you no show three or more times for appointments you may be dismissed from the clinic at the providers discretion.     Again, thank you for choosing [facility].  Our hope is that these requests will decrease the amount of time that you wait before being seen by our physicians.       _____________________________________________________________  Should you have questions after your visit to [facility], please contact our office at [phone] between the hours of 8:30 a.m. and 4:30 p.m.  Voicemails left after 4:30 p.m. will not be returned until the following business day.  For prescription refill requests, have your pharmacy contact our office.       Resources For Cancer Patients and their Caregivers ? American Cancer Society: Can assist with transportation, wigs, general needs, runs Look Good Feel Better.        548-062-2488 ? Cancer Care: Provides financial assistance,  online support groups, medication/co-pay assistance.  1-800-813-HOPE 660-017-5685) ? [place] Assists [place] cancer patients and their families through emotional , educational and financial support.  9567768588 ? [place] DSS Where to apply for food stamps, Medicaid and utility assistance. 918-654-3481 ? [facility]: Transportation to medical appointments. 919-001-3351 ? Social Security Administration: May apply for disability if have a Stage IV cancer. 902-770-0260 (220) [phone] ? [employer], Disability and Transit Services: Assists with nutrition, care and transit needs. [place] Support Programs: @10RELATIVEDAYS @ > Cancer Support Group  2nd Tuesday of the month 1pm-2pm, Journey Room  > Creative Journey  3rd Tuesday of the month 1130am-1pm, Journey Room  > Look Good Feel Better  1st Wednesday of the month 10am-12 noon, Journey Room (Call [place] to register [phone])

## 2017-11-27 LAB — BETA 2 MICROGLOBULIN, SERUM: Beta-2 Microglobulin: 3.3 mg/L — ABNORMAL HIGH (ref 0.6–2.4)

## 2017-11-28 LAB — PROTEIN ELECTROPHORESIS, SERUM
A/G Ratio: 1.3 (ref 0.7–1.7)
ALPHA-1-GLOBULIN: 0.3 g/dL (ref 0.0–0.4)
ALPHA-2-GLOBULIN: 0.7 g/dL (ref 0.4–1.0)
Albumin ELP: 3.1 g/dL (ref 2.9–4.4)
Beta Globulin: 0.8 g/dL (ref 0.7–1.3)
Gamma Globulin: 0.6 g/dL (ref 0.4–1.8)
Globulin, Total: 2.4 g/dL (ref 2.2–3.9)
M-SPIKE, %: 0.2 g/dL — AB
Total Protein ELP: 5.5 g/dL — ABNORMAL LOW (ref 6.0–8.5)

## 2017-11-29 LAB — IMMUNOFIXATION ELECTROPHORESIS
IgA: 24 mg/dL — ABNORMAL LOW (ref 61–437)
IgG (Immunoglobin G), Serum: 617 mg/dL — ABNORMAL LOW (ref 700–1600)
IgM (Immunoglobulin M), Srm: 30 mg/dL (ref 20–172)
TOTAL PROTEIN ELP: 5.8 g/dL — AB (ref 6.0–8.5)

## 2017-11-29 LAB — KAPPA/LAMBDA LIGHT CHAINS
Kappa free light chain: 18.2 mg/L (ref 3.3–19.4)
Kappa, lambda light chain ratio: 2.43 — ABNORMAL HIGH (ref 0.26–1.65)
Lambda free light chains: 7.5 mg/L (ref 5.7–26.3)

## 2017-11-30 LAB — MULTIPLE MYELOMA PANEL, SERUM
Albumin SerPl Elph-Mcnc: 3.3 g/dL (ref 2.9–4.4)
Albumin/Glob SerPl: 1.4 (ref 0.7–1.7)
Alpha 1: 0.3 g/dL (ref 0.0–0.4)
Alpha2 Glob SerPl Elph-Mcnc: 0.7 g/dL (ref 0.4–1.0)
B-GLOBULIN SERPL ELPH-MCNC: 0.9 g/dL (ref 0.7–1.3)
Gamma Glob SerPl Elph-Mcnc: 0.6 g/dL (ref 0.4–1.8)
Globulin, Total: 2.5 g/dL (ref 2.2–3.9)
IGM (IMMUNOGLOBULIN M), SRM: 33 mg/dL (ref 20–172)
IgA: 24 mg/dL — ABNORMAL LOW (ref 61–437)
IgG (Immunoglobin G), Serum: 593 mg/dL — ABNORMAL LOW (ref 700–1600)
M PROTEIN SERPL ELPH-MCNC: 0.2 g/dL — AB
TOTAL PROTEIN ELP: 5.8 g/dL — AB (ref 6.0–8.5)

## 2017-12-16 ENCOUNTER — Other Ambulatory Visit (HOSPITAL_COMMUNITY): Payer: Self-pay | Admitting: *Deleted

## 2017-12-16 ENCOUNTER — Encounter (HOSPITAL_COMMUNITY): Payer: Self-pay | Admitting: Adult Health

## 2017-12-16 ENCOUNTER — Other Ambulatory Visit (HOSPITAL_COMMUNITY): Payer: Self-pay | Admitting: Adult Health

## 2017-12-16 DIAGNOSIS — C9000 Multiple myeloma not having achieved remission: Secondary | ICD-10-CM

## 2017-12-16 MED ORDER — ALPRAZOLAM 0.5 MG PO TABS: 0.5000 mg | ORAL_TABLET | Freq: Every evening | ORAL | 2 refills | Status: DC | PRN

## 2017-12-16 NOTE — Progress Notes (Signed)
Received refill request from pharmacy for Xanax.   [place] Controlled Substance Reporting System reviewed and refill is appropriate on or after 12/16/17. Paper prescription printed & post-dated; will ask nursing to fax Rx to pt's pharmacy.    NCCSRS reviewed:     Mike Craze, NP [place] [phone]

## 2017-12-17 ENCOUNTER — Encounter (HOSPITAL_COMMUNITY): Payer: Self-pay | Admitting: Oncology

## 2017-12-24 ENCOUNTER — Encounter (HOSPITAL_COMMUNITY): Payer: Medicare Other | Attending: Oncology

## 2017-12-24 ENCOUNTER — Ambulatory Visit (HOSPITAL_COMMUNITY): Payer: Medicare Other

## 2017-12-24 ENCOUNTER — Encounter (HOSPITAL_BASED_OUTPATIENT_CLINIC_OR_DEPARTMENT_OTHER): Payer: Medicare Other | Admitting: Adult Health

## 2017-12-24 ENCOUNTER — Encounter (HOSPITAL_COMMUNITY): Payer: Self-pay

## 2017-12-24 ENCOUNTER — Encounter (HOSPITAL_COMMUNITY): Payer: Self-pay | Admitting: Adult Health

## 2017-12-24 ENCOUNTER — Encounter (HOSPITAL_BASED_OUTPATIENT_CLINIC_OR_DEPARTMENT_OTHER): Payer: Medicare Other

## 2017-12-24 ENCOUNTER — Other Ambulatory Visit (HOSPITAL_COMMUNITY): Payer: Self-pay | Admitting: Adult Health

## 2017-12-24 ENCOUNTER — Encounter (HOSPITAL_COMMUNITY): Payer: Medicare Other

## 2017-12-24 ENCOUNTER — Other Ambulatory Visit: Payer: Self-pay

## 2017-12-24 VITALS — BP 124/73 | HR 72 | Temp 98.4°F | Resp 18 | Ht 68.0 in | Wt 187.0 lb

## 2017-12-24 DIAGNOSIS — C9000 Multiple myeloma not having achieved remission: Secondary | ICD-10-CM

## 2017-12-24 DIAGNOSIS — C7951 Secondary malignant neoplasm of bone: Secondary | ICD-10-CM | POA: Diagnosis not present

## 2017-12-24 DIAGNOSIS — D649 Anemia, unspecified: Secondary | ICD-10-CM

## 2017-12-24 DIAGNOSIS — C50122 Malignant neoplasm of central portion of left male breast: Secondary | ICD-10-CM

## 2017-12-24 LAB — CBC WITH DIFFERENTIAL/PLATELET
BASOS PCT: 1 %
Basophils Absolute: 0 10*3/uL (ref 0.0–0.1)
EOS ABS: 0.3 10*3/uL (ref 0.0–0.7)
EOS PCT: 5 %
HEMATOCRIT: 31.1 % — AB (ref 39.0–52.0)
Hemoglobin: 10.2 g/dL — ABNORMAL LOW (ref 13.0–17.0)
LYMPHS ABS: 1 10*3/uL (ref 0.7–4.0)
Lymphocytes Relative: 16 %
MCH: 32 pg (ref 26.0–34.0)
MCHC: 32.8 g/dL (ref 30.0–36.0)
MCV: 97.5 fL (ref 78.0–100.0)
MONOS PCT: 10 %
Monocytes Absolute: 0.6 10*3/uL (ref 0.1–1.0)
NEUTROS PCT: 68 %
Neutro Abs: 4.1 10*3/uL (ref 1.7–7.7)
Platelets: 191 10*3/uL (ref 150–400)
RBC: 3.19 MIL/uL — AB (ref 4.22–5.81)
RDW: 17.1 % — ABNORMAL HIGH (ref 11.5–15.5)
WBC: 6.1 10*3/uL (ref 4.0–10.5)

## 2017-12-24 LAB — COMPREHENSIVE METABOLIC PANEL
ALK PHOS: 28 U/L — AB (ref 38–126)
ALT: 14 U/L — AB (ref 17–63)
AST: 20 U/L (ref 15–41)
Albumin: 3.9 g/dL (ref 3.5–5.0)
Anion gap: 9 (ref 5–15)
BUN: 16 mg/dL (ref 6–20)
CALCIUM: 9 mg/dL (ref 8.9–10.3)
CHLORIDE: 107 mmol/L (ref 101–111)
CO2: 21 mmol/L — AB (ref 22–32)
CREATININE: 1.34 mg/dL — AB (ref 0.61–1.24)
GFR, EST NON AFRICAN AMERICAN: 52 mL/min — AB (ref >60)
Glucose, Bld: 97 mg/dL (ref 65–99)
Potassium: 4.6 mmol/L (ref 3.5–5.1)
Sodium: 137 mmol/L (ref 135–145)
Total Bilirubin: 0.5 mg/dL (ref 0.3–1.2)
Total Protein: 6.4 g/dL — ABNORMAL LOW (ref 6.5–8.1)

## 2017-12-24 LAB — IRON AND TIBC
IRON: 83 ug/dL (ref 45–182)
Saturation Ratios: 26 % (ref 17.9–39.5)
TIBC: 319 ug/dL (ref 250–450)
UIBC: 236 ug/dL

## 2017-12-24 LAB — VITAMIN B12: Vitamin B-12: 318 pg/mL (ref 180–914)

## 2017-12-24 LAB — FOLATE: Folate: 66.9 ng/mL (ref >5.9)

## 2017-12-24 LAB — FERRITIN: Ferritin: 492 ng/mL — ABNORMAL HIGH (ref 24–336)

## 2017-12-24 MED ORDER — DENOSUMAB 120 MG/1.7ML ~~LOC~~ SOLN
120.0000 mg | Freq: Once | SUBCUTANEOUS | Status: AC
  Administered 2017-12-24: 120 mg via SUBCUTANEOUS
  Filled 2017-12-24: qty 1.7

## 2017-12-24 NOTE — Patient Instructions (Signed)
[place] [facility] Discharge Instructions  RECOMMENDATIONS MADE BY THE CONSULTANT AND ANY TEST RESULTS WILL BE SENT TO YOUR REFERRING PHYSICIAN.  Your calcium is 9.0 today. You got your Xgeva injection. Follow up next month as scheduled.  Thank you for choosing [place] at [facility] to provide your oncology and hematology care.  To afford each patient quality time with our provider, please arrive at least 15 minutes before your scheduled appointment time.    If you have a lab appointment with the [facility] please come in thru the  Main Entrance and check in at the main information desk  You need to re-schedule your appointment should you arrive 10 or more minutes late.  We strive to give you quality time with our providers, and arriving late affects you and other patients whose appointments are after yours.  Also, if you no show three or more times for appointments you may be dismissed from the clinic at the providers discretion.     Again, thank you for choosing [facility].  Our hope is that these requests will decrease the amount of time that you wait before being seen by our physicians.       _____________________________________________________________  Should you have questions after your visit to [facility], please contact our office at [phone] between the hours of 8:30 a.m. and 4:30 p.m.  Voicemails left after 4:30 p.m. will not be returned until the following business day.  For prescription refill requests, have your pharmacy contact our office.       Resources For Cancer Patients and their Caregivers ? American Cancer Society: Can assist with transportation, wigs, general needs, runs Look Good Feel Better.        620-251-5305 ? Cancer Care: Provides financial assistance, online support groups, medication/co-pay assistance.  1-800-813-HOPE 705-399-0443) ? [place] Assists [place] cancer patients and their families through emotional , educational and financial support.  305-666-4195 ? [place] DSS Where to apply for food stamps, Medicaid and utility assistance. 778-468-4922 ? RCATS: Transportation to medical appointments. 3364200860 ? Social Security Administration: May apply for disability if have a Stage IV cancer. 630-409-9767 7693254630 ? [employer], Disability and Transit Services: Assists with nutrition, care and transit needs. [place] Support Programs: @10RELATIVEDAYS @ > Cancer Support Group  2nd Tuesday of the month 1pm-2pm, Journey Room  > Creative Journey  3rd Tuesday of the month 1130am-1pm, Journey Room  > Look Good Feel Better  1st Wednesday of the month 10am-12 noon, Journey Room (Call [place] to register [phone])

## 2017-12-24 NOTE — Progress Notes (Signed)
Johnny Lucas presents today for injection per MD orders. Xgeva 120 mg administered SQ in left lower abdomen. Administration without incident. Patient tolerated well. Patient tolerated treatment without incidence. Patient discharged ambulatory and in stable condition from clinic with wife.  Patient to follow up as scheduled.

## 2017-12-24 NOTE — Progress Notes (Signed)
[place] [place], [place] [address]   CLINIC:  Medical Oncology/Hematology  PCP:  [name], MD [place] [address] [phone]   REASON FOR VISIT:  Follow-up for Stage II multiple myeloma AND Stage IV breast cancer with bone mets   CURRENT THERAPY: Post-stem cell transplant care/Surveillance AND Tamoxifen daily    BRIEF ONCOLOGIC HISTORY:    Breast cancer, male ([name])   12/31/2009 Initial Biopsy    Biopsy of L breast       12/31/2009 Pathology Results    Invasive ductal carcinoma, ER/PR+, HER 2 negative      12/31/2009 Imaging    Ultrasound showing a 2.43 x 1.85 x 3 cm hypoechoic spiculated mass in the 12 o clock L breast retroareolar region      01/01/2010 -  Anti-estrogen oral therapy    Tamoxifen 20 mg daily      01/06/2010 Imaging    Bone scan abnormal uptake in the diaphysis of the R humerus, abnormal in the R third, fifth and sixth ribs, lesion also noted in the sternum.      02/03/2010 Surgery    Rod placement and fixation of R humerus by Dr. [name]      02/05/2010 - 02/18/2010 Radiation Therapy    30Gy in 10 fractions of 3 Gy per fraction to R pathologic fracture      03/11/2010 -  Chemotherapy    Denosumab monthly, now every 3 months. Started at [facility]       06/09/2016 Imaging    Three hypermetabolic osseous lesions in the sternum, left ilium and right ilium, as discussed above, likely represent osseous metastases. At this time, these are not recognizable on the CT images. 2. No extra skeletal metastatic disease identified in the neck, chest, abdomen or pelvis.      10/13/2016 Progression    PET shows various new and enlarging osseous metastatic lesions with no definite extra osseous metastatic disease currently identified.       12/31/2016 Progression    1. Multifocal hypermetabolic osseous metastases throughout the axial and proximal appendicular skeleton, which are increased in size, number and metabolism  since 10/13/2016 PET-CT. 2. 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 3. No additional sites of hypermetabolic metastatic disease. 4. Chronic right mastoid sinusitis. 5. Aortic atherosclerosis.  One vessel coronary atherosclerosis.       Multiple myeloma ([place])   02/12/2017 Bone Marrow Biopsy    The marrow was variably cellular with large peritrabecular aggregates of kappa restricted plasma cells (66% by aspirate, 30% by Cd138). Cytogenetics +11.       03/01/2017 - 06/29/2017 Chemotherapy    RVD       05/26/2017 Bone Marrow Biopsy    Performed at [facility]:  Plasma cell myeloma in a 30% cellularmarrow with decreased trilineage hematopoiesis and 42% kappalight chain restricted plasma cells on the aspirate smears andlarge aggregates on the core biopsy.       07/12/2017 - 09/01/2017 Chemotherapy    3 cycles of carfizolmib/cyclophosphamide/dexamethasone        10/08/2017 Bone Marrow Transplant    Autotransplant at [facility]        HISTORY OF PRESENT ILLNESS:  (From Dr. Laverle Patter note on 02/18/17)      INTERVAL HISTORY:  Mr. Yadav 71 y.o. male here for routine follow-up for multiple myeloma and Stage  IV metastatic breast cancer.   S/p autologous stem cell transplant at [facility] on 10/08/17.  Post-transplant course was complicated by neutropenic fever. Last visit with transplant team was on 11/08/17 with Walden Field, NP.   Here today with his wife.   He tells me he is continuing to regain his strength.  Appetite 50%; energy levels 25%. Denies any fever/chills.  He has intermittent dizziness. Peripheral neuropathy to feet remains, but is improved with gabapentin.    Due to return to [facility] in January for repeat imaging and bone marrow biopsy evaluation.    Remains on prophylactic Bactrim and Valtrex as  prescribed by transplant team.    He previously asked about getting flu vaccine. Our pharmD  discussed with transplant team at [facility] and they do not recommend any vaccinations until after day  +120 of transplant.   We are planning on resuming bisphosphonate therapy today with Xgeva monthly per [facility]'s recommendations to start after day +60.     REVIEW OF SYSTEMS:  Review of Systems  Constitutional: Positive for fatigue. Negative for chills and fever.  HENT:  Negative.   Eyes: Negative.   Respiratory: Negative.   Cardiovascular: Positive for leg swelling.  Gastrointestinal: Negative.   Endocrine: Negative.   Genitourinary: Negative.    Musculoskeletal: Negative.   Skin: Negative.   Neurological: Positive for dizziness and numbness.  Hematological: Negative.   Psychiatric/Behavioral: Negative.      PAST MEDICAL/SURGICAL HISTORY:  Past Medical History:  Diagnosis Date  . Anxiety   . Bone metastases ([place]) 09/10/2016  . Breast cancer ([place]) 2011   Stave IV breast cancer; radiation and tamoxifen  . Breast cancer, male ([place])    Stave IV breast cancer; radiation and tamoxifen  Overview:  Left breast ca with mets bone  Overview:  METS TO BONE  . GERD (gastroesophageal reflux disease)   . Hypertension   . Macular degeneration   . Multiple myeloma ([place]) 02/18/2017   Past Surgical History:  Procedure Laterality Date  . BACK SURGERY    . HERNIA REPAIR    . right arm surgery       SOCIAL HISTORY:  Social History   Socioeconomic History  . Marital status: Married    Spouse name: Not on file  . Number of children: 3  . Years of education: Not on file  . Highest education level: Not on file  Social Needs  . Financial resource strain: Not on file  . Food insecurity - worry: Not on file  . Food insecurity - inability: Not on file  . Transportation needs - medical: Not on file  . Transportation needs - non-medical: Not on file  Occupational History  . Not on file    Tobacco Use  . Smoking status: Never Smoker  . Smokeless tobacco: Former Network engineer and Sexual Activity  . Alcohol use: Yes    Alcohol/week: 14.4 oz    Types: 24 Cans of beer per week  . Drug use: No  . Sexual activity: Not on file  Other Topics Concern  . Not on file  Social History Narrative  . Not on file    FAMILY HISTORY:  Family History  Problem Relation Age of Onset  . Stroke Mother   . Cancer Maternal Aunt        cancer NOS; died in her 36s  . Lung cancer Maternal Uncle        smoker    CURRENT MEDICATIONS:  Outpatient Encounter  Medications as of 12/24/2017  Medication Sig Note  . acyclovir (ZOVIRAX) 400 MG tablet Take 1 tablet (400 mg total) by mouth daily.   Marland Kitchen albuterol (PROVENTIL HFA;VENTOLIN HFA) 108 (90 Base) MCG/ACT inhaler Inhale 2 puffs into the lungs every 6 (six) hours as needed for wheezing or shortness of breath.   . ALPRAZolam (XANAX) 0.5 MG tablet Take 1 tablet (0.5 mg total) by mouth at bedtime as needed for anxiety.   Marland Kitchen aspirin EC 81 MG tablet Take 162 mg by mouth daily.  09/10/2016: Received from: [facility]: Take 162 mg by mouth daily.  . calcium-vitamin D (OSCAL WITH D) 500-200 MG-UNIT TABS tablet TAKE 2 TABLETS BY MOUTH DAILY.   . ferrous sulfate 325 (65 FE) MG EC tablet Take 1 tablet (325 mg total) by mouth 2 (two) times daily after a meal.   . folic acid (FOLVITE) 1 MG tablet Take 1 mg by mouth.   . gabapentin (NEURONTIN) 300 MG capsule Take two times a day, then increase to three times a day in a few weeks.   Marland Kitchen loperamide (IMODIUM) 2 MG capsule Take 1 capsule (2 mg total) by mouth after each loose stool (max 25m per day).   . metoprolol succinate (TOPROL-XL) 100 MG 24 hr tablet 50 mg.   . Multiple Vitamins-Minerals (OCUVITE PO) Take by mouth daily.  09/10/2016: Received from: [facility] Take 1 tablet by mouth daily.  . ondansetron (ZOFRAN) 8 MG tablet Take 1 tablet (8 mg total) by mouth every 8 (eight) hours  as needed for nausea or vomiting.   . pantoprazole (PROTONIX) 40 MG tablet Take 40 mg by mouth daily. 03/29/2017: Takes prn  . polyethylene glycol (MIRALAX / GLYCOLAX) packet Take 17 g by mouth daily as needed.   . prochlorperazine (COMPAZINE) 10 MG tablet Take 1 tablet (10 mg total) by mouth every 6 (six) hours as needed for nausea or vomiting.   . Skin Protectants, Misc. (EUCERIN) cream Apply topically.   . tamoxifen (NOLVADEX) 20 MG tablet TAKE 1 TABLET EVERY DAY    No facility-administered encounter medications on file as of 12/24/2017.     ALLERGIES:  No Known Allergies   PHYSICAL EXAM:  ECOG Performance status: 1 - Symptomatic, but largely independent.   Vitals:   12/24/17 0939  BP: 124/73  Pulse: 72  Resp: 18  Temp: 98.4 F (36.9 C)  SpO2: 98%   Filed Weights   12/24/17 0939  Weight: 187 lb (84.8 kg)      Physical Exam  Constitutional: He is oriented to person, place, and time and well-developed, well-nourished, and in no distress.  HENT:  Head: Normocephalic.  Mouth/Throat: Oropharynx is clear and moist. No oropharyngeal exudate.  Eyes: Conjunctivae are normal. Pupils are equal, round, and reactive to light. No scleral icterus.  Neck: Normal range of motion. Neck supple.  Cardiovascular: Normal rate and regular rhythm.  Pulmonary/Chest: Effort normal and breath sounds normal. No respiratory distress.  Abdominal: Soft. Bowel sounds are normal. There is no tenderness.  Musculoskeletal: Normal range of motion. He exhibits no edema.  Lymphadenopathy:    He has no cervical adenopathy.       Right: No supraclavicular adenopathy present.       Left: No supraclavicular adenopathy present.  Neurological: He is alert and oriented to person, place, and time. No cranial nerve deficit. Gait normal.  Skin: Skin is warm and dry. No rash noted.  Psychiatric: Mood, memory, affect and judgment normal.  Nursing note and vitals reviewed.    LABORATORY DATA:  I have reviewed  the labs as listed.  CBC    Component Value Date/Time   WBC 6.1 12/24/2017 1029   RBC 3.19 (L) 12/24/2017 1029   HGB 10.2 (L) 12/24/2017 1029   HCT 31.1 (L) 12/24/2017 1029   PLT 191 12/24/2017 1029   MCV 97.5 12/24/2017 1029   MCH 32.0 12/24/2017 1029   MCHC 32.8 12/24/2017 1029   RDW 17.1 (H) 12/24/2017 1029   LYMPHSABS 1.0 12/24/2017 1029   MONOABS 0.6 12/24/2017 1029   EOSABS 0.3 12/24/2017 1029   BASOSABS 0.0 12/24/2017 1029   CMP Latest Ref Rng & Units 12/24/2017 11/26/2017 11/04/2017  Glucose 65 - 99 mg/dL 97 97 94  BUN 6 - 20 mg/dL _0 Creatinine 0.61 - 1.24 mg/dL 1.34(H) 1.40(H) 0.95  Sodium 135 - 145 mmol/L 137 137 135  Potassium 3.5 - 5.1 mmol/L 4.6 4.0 4.0  Chloride 101 - 111 mmol/L 107 106 104  CO2 22 - 32 mmol/L 21(L) 25 25  Calcium 8.9 - 10.3 mg/dL 9.0 8.8(L) 8.1(L)  Total Protein 6.5 - 8.1 g/dL 6.4(L) 6.1(L) 5.4(L)  Total Bilirubin 0.3 - 1.2 mg/dL 0.5 0.2(L) 0.8  Alkaline Phos 38 - 126 U/L 28(L) 35(L) 34(L)  AST 15 - 41 U/L 20 33 25  ALT 17 - 63 U/L 14(L) 46 13(L)    PENDING LABS:    DIAGNOSTIC IMAGING:  *Images and radiologic reports reviewed independently and agree with finds as listed below.  PET scan: 12/31/16      Mammogram 04/06/17       PATHOLOGY:  (R) humerus bone path: 01/21/10 (re-evaluated on 04/09/17)     Genetics report: 01/22/14    Bone marrow biopsy: 02/02/17   Cytogenetics report: 02/10/17        ASSESSMENT & PLAN:   IgG kappa multiple myeloma:  -SPEP in 12/2016 revealed M-spike 2.3% with elevated IgG monoclonal protein (3,255 mg/dL) and elevated kappa light chains (2,129.1 mg/L) & kappa/lambda light chain ratio (272.96). Bone marrow biopsy on 02/02/17 demonstrated multiple myeloma; evidence of large peritravecular aggregates with kappa restricted plasma cells (66% by aspirate, 30% by Cd138). Cytogenetics +11.  Was previously on Revlimid/Velcade/Decadron from 03/01/17-06/29/17.  SPEP with good response to therapy with  decrease seen in M-spike, kappa light chain, and kappa/lambda light chain ratio. Patient saw Dr. Norma Fredrickson at [facility] for evaluation for stem cell transplant.  He recommended change of therapy to Carfilzomib/Cyclophosphamide/Decadron to attempt to achieve deeper response before moving forward with stem cell transplant. He completed 2 cycles of this therapy and proceeded with autologous stem cell transplant on 10/08/17.   -Today is day +78 s/p autologous stem cell transplant.  He is scheduled to return to [facility] for day +100 post-transplant evaluation in the coming weeks (mid-January 2019).  -Continue Bactrim 3x/week for PCP prophylaxis; continue Valtrex for varicella zoster/herpes simplex prophylaxis.  -Post-transplant vaccinations will start 6 months after transplant.  Unclear if we can give annual flu vaccine; our PharmD is checking on this for [place]. If he is able, we will administer at his follow-up visit in 1 month.  Explained to Mr. Debes that generally his initial post-transplant childhood vaccines are initially administered at [facility] at his 19-monthfollow-up visit. However, after that time, we are happy to help with the remainder of his vaccinations for pt's convenience, if this is agreeable to his transplant team.  -Return to cancer center in ~2 months for follow-up and to  start maintenance treatment regimen after Dr. Rossie Muskrat recommendations are made.    Anemia:  -Likely secondary to continued post-transplant recovery.  Collected anemia panel today (vitamin B12, folate and iron studies); results are pending.   -Hgb slightly improved today at 10.2 g/dL. WBCs and platelets are engrafted and normal.     Bone mets:  -Based on recommendations from stem cell transplant team, we will plan to resume bisphosphonate therapy after day +60.  Recommend continuation of therapy for 2 years post-transplant as long as kidney function allows.  -Serum calcium adequate today. Resume Xgeva today.  -Continue  Xgeva monthly.    Metastatic male left breast cancer with bone mets:  -Mammogram in 03/2017 with reduced size of initial mass in left breast (originally 3 cm, now 1.1 cm). (R) humerus bone pathology from 2011. Staining was done which was ER+; there was not enough tissue to do HER2. However, ER+ staining supports metastatic breast cancer. -Genetics report dated 01/11/14: Variant of Unknown Significance, "[id number]."  -He understands that while metastatic breast cancer is not curable, it remains treatable.   -He has resumed his Tamoxifen (which was recommended he resume 1 month post-transplant). He is tolerating well.     Nutrition:  -Encouraged him to continue to eat a variety of foods, particularly those high in protein.  He will see our dietitian, Jennet Maduro, RD, today for additional support and recommendations. Please see her documentation for additional recommendations.       Dispo:  -Return to cancer center in ~2 month for follow-up with labs.   Delton See monthly.    All questions were answered to patient's stated satisfaction. Encouraged patient to call with any new concerns or questions before his next visit to the cancer center and we can certain see him sooner, if needed.     Orders placed this encounter:  Orders Placed This Encounter  Procedures  . CBC with Differential/Platelet  . Comprehensive metabolic panel  . Beta 2 microglobulin, serum  . CBC with Differential/Platelet  . Comprehensive metabolic panel  . Immunofixation electrophoresis  . IgG, IgA, IgM  . Protein electrophoresis, serum  . Kappa/lambda light chains  . Comprehensive metabolic panel      Mike Craze, NP [place] [phone]

## 2017-12-24 NOTE — Progress Notes (Signed)
Nutrition Follow-up:  Patient with stage IV breast cancer with bone mets and multiple myeloma s/p stem cell transplant at [facility].    Met with patient and wife today following MD appointment.  Patient reports that his appetite is coming back around.  "I have been eating lots of sweets here lately due to the holidays (peanut butter balls, cakes, fudge with nuts, etc)"  Reports that he still has been drinking eggnog.  He also has tried the ensure and boost.  Has been trying to eat a little bit more at meal time and include good source of protein.     Medications: reviewed  Labs: reviewed  Anthropometrics:   Weight increased to 187 lb today from 186 lb on 11/30.    NUTRITION DIAGNOSIS: Inadequate oral intake improved   INTERVENTION:   Provided patient with first complimentary case of ensure plus today.  Encouraged patient to continue to drink 1-2 per day for weight maintenance Encouraged patient to continue to eat high calorie, high protein foods to maintain weight    MONITORING, EVALUATION, GOAL: patient will consume adequate calories and protein to meet nutritional needs and prevent further weight loss   NEXT VISIT: Jan 25   Lois Slagel B. Zenia Resides, [place], [place] Registered Dietitian [phone] (pager)

## 2018-01-04 ENCOUNTER — Other Ambulatory Visit (HOSPITAL_COMMUNITY): Payer: Self-pay | Admitting: Adult Health

## 2018-01-04 ENCOUNTER — Other Ambulatory Visit (HOSPITAL_COMMUNITY): Payer: Self-pay | Admitting: Emergency Medicine

## 2018-01-04 MED ORDER — GABAPENTIN 300 MG PO CAPS: ORAL_CAPSULE | ORAL | 2 refills | Status: DC

## 2018-01-04 NOTE — Progress Notes (Signed)
Gabapentin refilled

## 2018-01-19 DIAGNOSIS — Z1509 Genetic susceptibility to other malignant neoplasm: Secondary | ICD-10-CM | POA: Diagnosis not present

## 2018-01-19 DIAGNOSIS — Z9484 Stem cells transplant status: Secondary | ICD-10-CM | POA: Diagnosis not present

## 2018-01-19 DIAGNOSIS — C9000 Multiple myeloma not having achieved remission: Secondary | ICD-10-CM | POA: Diagnosis not present

## 2018-01-21 ENCOUNTER — Inpatient Hospital Stay (HOSPITAL_COMMUNITY): Payer: Medicare Other

## 2018-01-21 ENCOUNTER — Inpatient Hospital Stay (HOSPITAL_COMMUNITY): Payer: Medicare Other | Attending: Internal Medicine

## 2018-01-21 ENCOUNTER — Other Ambulatory Visit (HOSPITAL_COMMUNITY): Payer: Medicare Other

## 2018-01-21 ENCOUNTER — Encounter (HOSPITAL_COMMUNITY): Payer: Self-pay

## 2018-01-21 VITALS — BP 128/71 | HR 65 | Temp 97.7°F | Resp 20

## 2018-01-21 DIAGNOSIS — C7951 Secondary malignant neoplasm of bone: Secondary | ICD-10-CM

## 2018-01-21 DIAGNOSIS — C9000 Multiple myeloma not having achieved remission: Secondary | ICD-10-CM | POA: Insufficient documentation

## 2018-01-21 DIAGNOSIS — Z79899 Other long term (current) drug therapy: Secondary | ICD-10-CM | POA: Insufficient documentation

## 2018-01-21 LAB — CBC WITH DIFFERENTIAL/PLATELET
BASOS ABS: 0 10*3/uL (ref 0.0–0.1)
BASOS PCT: 0 %
Eosinophils Absolute: 0.2 10*3/uL (ref 0.0–0.7)
Eosinophils Relative: 5 %
HEMATOCRIT: 31.4 % — AB (ref 39.0–52.0)
HEMOGLOBIN: 10.4 g/dL — AB (ref 13.0–17.0)
Lymphocytes Relative: 26 %
Lymphs Abs: 1.3 10*3/uL (ref 0.7–4.0)
MCH: 33.4 pg (ref 26.0–34.0)
MCHC: 33.1 g/dL (ref 30.0–36.0)
MCV: 101 fL — ABNORMAL HIGH (ref 78.0–100.0)
Monocytes Absolute: 0.3 10*3/uL (ref 0.1–1.0)
Monocytes Relative: 7 %
NEUTROS ABS: 3.1 10*3/uL (ref 1.7–7.7)
NEUTROS PCT: 62 %
Platelets: 203 10*3/uL (ref 150–400)
RBC: 3.11 MIL/uL — ABNORMAL LOW (ref 4.22–5.81)
RDW: 15.2 % (ref 11.5–15.5)
WBC: 5 10*3/uL (ref 4.0–10.5)

## 2018-01-21 LAB — COMPREHENSIVE METABOLIC PANEL
ALBUMIN: 3.9 g/dL (ref 3.5–5.0)
ALK PHOS: 25 U/L — AB (ref 38–126)
ALT: 11 U/L — ABNORMAL LOW (ref 17–63)
AST: 20 U/L (ref 15–41)
Anion gap: 10 (ref 5–15)
BILIRUBIN TOTAL: 0.6 mg/dL (ref 0.3–1.2)
BUN: 14 mg/dL (ref 6–20)
CO2: 20 mmol/L — AB (ref 22–32)
Calcium: 9.1 mg/dL (ref 8.9–10.3)
Chloride: 109 mmol/L (ref 101–111)
Creatinine, Ser: 1.28 mg/dL — ABNORMAL HIGH (ref 0.61–1.24)
GFR calc Af Amer: >60 mL/min (ref >60)
GFR calc non Af Amer: 55 mL/min — ABNORMAL LOW (ref >60)
GLUCOSE: 89 mg/dL (ref 65–99)
Potassium: 4.3 mmol/L (ref 3.5–5.1)
SODIUM: 139 mmol/L (ref 135–145)
TOTAL PROTEIN: 6.3 g/dL — AB (ref 6.5–8.1)

## 2018-01-21 MED ORDER — DENOSUMAB 120 MG/1.7ML ~~LOC~~ SOLN
120.0000 mg | Freq: Once | SUBCUTANEOUS | Status: AC
  Administered 2018-01-21: 120 mg via SUBCUTANEOUS
  Filled 2018-01-21: qty 1.7

## 2018-01-21 NOTE — Progress Notes (Signed)
Johnny Lucas tolerated Xgeva injection well without complaints or incident. Calcium 9.1 today and pt denied any tooth,jaw or leg pain and no recent or future dental appts. VSS Pt discharged self ambulatory in satisfactory condition accompanied by his wife

## 2018-01-21 NOTE — Patient Instructions (Signed)
[facility] at [facility] Discharge Instructions  RECOMMENDATIONS MADE BY THE CONSULTANT AND ANY TEST RESULTS WILL BE SENT TO YOUR REFERRING PHYSICIAN.  Received Xgeva injection today. Follow-up as scheduled. Call clinic for any questions or concerns  Thank you for choosing [facility] at [facility] to provide your oncology and hematology care.  To afford each patient quality time with our provider, please arrive at least 15 minutes before your scheduled appointment time.    If you have a lab appointment with the Cancer Center please come in thru the  Main Entrance and check in at the main information desk  You need to re-schedule your appointment should you arrive 10 or more minutes late.  We strive to give you quality time with our providers, and arriving late affects you and other patients whose appointments are after yours.  Also, if you no show three or more times for appointments you may be dismissed from the clinic at the providers discretion.     Again, thank you for choosing [facility].  Our hope is that these requests will decrease the amount of time that you wait before being seen by our physicians.       _____________________________________________________________  Should you have questions after your visit to [facility], please contact our office at [phone] between the hours of 8:30 a.m. and 4:30 p.m.  Voicemails left after 4:30 p.m. will not be returned until the following business day.  For prescription refill requests, have your pharmacy contact our office.       Resources For Cancer Patients and their Caregivers ? American Cancer Society: Can assist with transportation, wigs, general needs, runs Look Good Feel Better.        1-888-227-6333 ? Cancer Care: Provides financial assistance, online support groups, medication/co-pay assistance.  1-800-813-HOPE (4673) ? [facility] Assists [place] cancer patients and their families through emotional , educational and financial support.  336-427-4357 ? [place] DSS Where to apply for food stamps, Medicaid and utility assistance. 336-342-1394 ? RCATS: Transportation to medical appointments. 336-347-2287 ? Social Security Administration: May apply for disability if have a Stage IV cancer. 336-342-7796 1-800-772-1213 ? [place] Aging, Disability and Transit Services: Assists with nutrition, care and transit needs. 336-349-2343  Cancer Center Support Programs: @10RELATIVEDAYS@ > Cancer Support Group  2nd Tuesday of the month 1pm-2pm, Journey Room  > Creative Journey  3rd Tuesday of the month 1130am-1pm, Journey Room  > Look Good Feel Better  1st Wednesday of the month 10am-12 noon, Journey Room (Call [employer] to register 1-800-395-5775)   

## 2018-01-21 NOTE — Progress Notes (Signed)
Nutrition Follow-up:  Patient with stage IV breast cancer with bone mets and multiple myeloma s/p stem cell transplant at [facility]  Met with patient and wife today prior to injection.  Patient reports that appetite is continuing to improve and foods are tasting better.  Patient reports that he has been drinking about 1 ensure plus per day.  "I am out of the case you gave me."  Reports that he has been eating cereal or pancakes or egg, bacon, cheese mcmuffin for breakfast with ensure plus usually.  For lunch usually snacks on peanut butter crackers, peanut M&Ms.  Last night for supper had cube steak and vegetables.  Likes milkshakes too.    Reports that he has gotten back on his treadmill and walking about 1/2 mile per day.  Reports that prior to him getting sick would walk 3 miles per day.  Medications: reviewed  Labs: reviewed  Anthropometrics:   Weight measured today at 190 lb 9 oz increased from 187 lb on 12/28.     NUTRITION DIAGNOSIS: Inadequate oral intake improved   INTERVENTION:   Provided patient with second complimentary case of ensure plus today.  Encouraged patient to continue to drink 1-2 per day. Encouraged patient to continue to consume high calorie, high protein foods and well balanced diet. Patient appreciative of care    MONITORING, EVALUATION, GOAL: patient will consume adequate calories and protein to meet nutritional needs and prevent further weight loss   NEXT VISIT: Feb 22  Amarionna Arca B. Zenia Resides, [place], [place] Registered Dietitian [phone] (pager)

## 2018-01-22 LAB — IGG, IGA, IGM
IGA: 11 mg/dL — AB (ref 61–437)
IGM (IMMUNOGLOBULIN M), SRM: 28 mg/dL (ref 20–172)
IgG (Immunoglobin G), Serum: 636 mg/dL — ABNORMAL LOW (ref 700–1600)

## 2018-01-22 LAB — BETA 2 MICROGLOBULIN, SERUM: Beta-2 Microglobulin: 2.2 mg/L (ref 0.6–2.4)

## 2018-01-24 LAB — KAPPA/LAMBDA LIGHT CHAINS
Kappa free light chain: 15.5 mg/L (ref 3.3–19.4)
Kappa, lambda light chain ratio: 1.37 (ref 0.26–1.65)
Lambda free light chains: 11.3 mg/L (ref 5.7–26.3)

## 2018-01-24 LAB — IMMUNOFIXATION ELECTROPHORESIS
IGA: 11 mg/dL — AB (ref 61–437)
IGG (IMMUNOGLOBIN G), SERUM: 623 mg/dL — AB (ref 700–1600)
IgM (Immunoglobulin M), Srm: 28 mg/dL (ref 20–172)
Total Protein ELP: 5.9 g/dL — ABNORMAL LOW (ref 6.0–8.5)

## 2018-01-25 LAB — PROTEIN ELECTROPHORESIS, SERUM
A/G RATIO SPE: 1.7 (ref 0.7–1.7)
Albumin ELP: 3.8 g/dL (ref 2.9–4.4)
Alpha-1-Globulin: 0.3 g/dL (ref 0.0–0.4)
Alpha-2-Globulin: 0.6 g/dL (ref 0.4–1.0)
Beta Globulin: 0.8 g/dL (ref 0.7–1.3)
GLOBULIN, TOTAL: 2.3 g/dL (ref 2.2–3.9)
Gamma Globulin: 0.6 g/dL (ref 0.4–1.8)
M-SPIKE, %: 0.3 g/dL — AB
TOTAL PROTEIN ELP: 6.1 g/dL (ref 6.0–8.5)

## 2018-01-26 DIAGNOSIS — R110 Nausea: Secondary | ICD-10-CM | POA: Diagnosis not present

## 2018-01-26 DIAGNOSIS — C9000 Multiple myeloma not having achieved remission: Secondary | ICD-10-CM | POA: Diagnosis not present

## 2018-01-26 DIAGNOSIS — Z853 Personal history of malignant neoplasm of breast: Secondary | ICD-10-CM | POA: Diagnosis not present

## 2018-01-26 DIAGNOSIS — K30 Functional dyspepsia: Secondary | ICD-10-CM | POA: Diagnosis not present

## 2018-01-26 DIAGNOSIS — Z9484 Stem cells transplant status: Secondary | ICD-10-CM | POA: Diagnosis not present

## 2018-02-09 ENCOUNTER — Telehealth (HOSPITAL_COMMUNITY): Payer: Self-pay | Admitting: Emergency Medicine

## 2018-02-09 ENCOUNTER — Ambulatory Visit (HOSPITAL_COMMUNITY): Payer: Medicare Other | Admitting: Internal Medicine

## 2018-02-09 ENCOUNTER — Other Ambulatory Visit: Payer: Self-pay

## 2018-02-09 ENCOUNTER — Other Ambulatory Visit (HOSPITAL_COMMUNITY): Payer: Self-pay | Admitting: Pharmacist

## 2018-02-09 ENCOUNTER — Encounter (HOSPITAL_COMMUNITY): Payer: Self-pay | Admitting: Emergency Medicine

## 2018-02-09 ENCOUNTER — Inpatient Hospital Stay (HOSPITAL_COMMUNITY): Payer: Medicare Other | Attending: Internal Medicine | Admitting: Internal Medicine

## 2018-02-09 ENCOUNTER — Encounter (HOSPITAL_COMMUNITY): Payer: Self-pay | Admitting: Internal Medicine

## 2018-02-09 VITALS — BP 127/69 | HR 63 | Temp 98.3°F | Resp 18 | Wt 196.5 lb

## 2018-02-09 DIAGNOSIS — Z9484 Stem cells transplant status: Secondary | ICD-10-CM | POA: Diagnosis not present

## 2018-02-09 DIAGNOSIS — F419 Anxiety disorder, unspecified: Secondary | ICD-10-CM | POA: Insufficient documentation

## 2018-02-09 DIAGNOSIS — N5201 Erectile dysfunction due to arterial insufficiency: Secondary | ICD-10-CM

## 2018-02-09 DIAGNOSIS — M1990 Unspecified osteoarthritis, unspecified site: Secondary | ICD-10-CM | POA: Diagnosis not present

## 2018-02-09 DIAGNOSIS — C50122 Malignant neoplasm of central portion of left male breast: Secondary | ICD-10-CM | POA: Diagnosis not present

## 2018-02-09 DIAGNOSIS — Z79899 Other long term (current) drug therapy: Secondary | ICD-10-CM | POA: Diagnosis not present

## 2018-02-09 DIAGNOSIS — Z8673 Personal history of transient ischemic attack (TIA), and cerebral infarction without residual deficits: Secondary | ICD-10-CM | POA: Diagnosis not present

## 2018-02-09 DIAGNOSIS — K5900 Constipation, unspecified: Secondary | ICD-10-CM | POA: Diagnosis not present

## 2018-02-09 DIAGNOSIS — Z923 Personal history of irradiation: Secondary | ICD-10-CM | POA: Insufficient documentation

## 2018-02-09 DIAGNOSIS — I10 Essential (primary) hypertension: Secondary | ICD-10-CM | POA: Diagnosis not present

## 2018-02-09 DIAGNOSIS — Z5111 Encounter for antineoplastic chemotherapy: Secondary | ICD-10-CM | POA: Insufficient documentation

## 2018-02-09 DIAGNOSIS — Z809 Family history of malignant neoplasm, unspecified: Secondary | ICD-10-CM | POA: Diagnosis not present

## 2018-02-09 DIAGNOSIS — C9000 Multiple myeloma not having achieved remission: Secondary | ICD-10-CM | POA: Insufficient documentation

## 2018-02-09 DIAGNOSIS — Z170 Estrogen receptor positive status [ER+]: Secondary | ICD-10-CM | POA: Insufficient documentation

## 2018-02-09 DIAGNOSIS — K219 Gastro-esophageal reflux disease without esophagitis: Secondary | ICD-10-CM | POA: Insufficient documentation

## 2018-02-09 DIAGNOSIS — C7951 Secondary malignant neoplasm of bone: Secondary | ICD-10-CM | POA: Diagnosis not present

## 2018-02-09 MED ORDER — DEXAMETHASONE 4 MG PO TABS: ORAL_TABLET | ORAL | 3 refills | Status: DC

## 2018-02-09 MED ORDER — DEXAMETHASONE 4 MG PO TABS: ORAL_TABLET | ORAL | 4 refills | Status: DC

## 2018-02-09 NOTE — Progress Notes (Signed)
Information pulled together for pt to review for the re-start of kyprolis and cytoxan.

## 2018-02-09 NOTE — Patient Instructions (Signed)
[facility]   CHEMOTHERAPY INSTRUCTIONS  You have multiple myeloma.  We are going to treat you with 2 cycles of carfilzomib (kyprolis) and cyclophosphamide.  Then you will be on maintenance kyprolis.  You will still follow up with Dr Tiana Loft at [facility] as scheduled.  You will see the doctor regularly throughout treatment.  We monitor your lab work prior to every treatment.  The doctor monitors your response to treatment by the way you are feeling, your blood work, and scans periodically. There will be wait times during your treatment.  It will take about 30 minutes to 1 hour for your lab work to result.  There will be wait time while your medications are mixed in the pharmacy.     You will receive premedications prior to your treatment: Premeds: Aloxi - high powered nausea/vomiting prevention medication used for chemotherapy patients. Dexamethasone - steroid - given to reduce the risk of you having an allergic type reaction to the chemotherapy. Dex can cause you to feel energized, nervous/anxious/jittery, make you have trouble sleeping, and/or make you feel hot/flushed in the face/neck and/or look pink/red in the face/neck. These side effects will pass as the Dex wears off. (takes 20 minutes to infuse)   POTENTIAL SIDE EFFECTS OF TREATMENT:  Carfilzomib (Kyprolis)  About This Drug Carfilzomib is used to treat cancer. It is given in the vein (IV). This will take 30 minutes to infuse.   You will have 250 mL of fluid before you get this drug.  This will help protect your kidneys.   Possible Side Effects . A decrease in the number of red blood cells. This may make you tired and weak. . A decrease in the number of platelets. This may raise your risk of bleeding. . Tiredness . Nausea . Loose bowel movements (diarrhea) . Fever . Cough and trouble breathing . Headache . Swelling of your legs, ankles and/or feet Note: Each of the side effects above was  reported in 20% or greater of patients treated with carfilzomib. Not all possible side effects are included above.  Warnings and Precautions . Congestive heart failure and/or heart attack which rarely could be life-threatening. You may be short of breath. Your arms, hands, legs and feet may swell. . Changes in your kidney function which can very rarely cause kidney failure . Tumor lysis: This drug may act on the cancer cells very quickly. This may affect how your kidneys work. Tumor lysis can be life threatening. . Inflammation (swelling) of the lungs or fluid build-up in your lungs. In rare cases respiratory failure has resulted, which can be life-threatening. You may have a dry cough or trouble breathing which may be severe. . High blood pressure in arteries of the lungs can very rarely occur. . High blood pressure, which can very rarely be life-threatening . Blood clots and events such as heart attack. A blood clot in your leg may cause your leg to swell, appear red and warm, and/or cause pain. A blood clot in your lungs may cause trouble breathing, pain when breathing, and/or chest pain. Women should discuss choice of contraception and risk of blood clots with their medical team. . While you are getting this drug in your vein (IV), you may have a reaction to the drug. Sometimes you may be given medication to stop or lessen these side effects. Your nurse will check you closely for these signs: fever or shaking chills, flushing, facial swelling, feeling dizzy, headache, trouble breathing,  rash, itching, chest tightness, or chest pain. These reactions may happen after your infusion. If this happens, call 911 for emergency care. . Abnormal bleeding which can very rarely be life-threatening- symptoms may be coughing up blood, throwing up blood (may look like coffee grounds), red or black tarry bowel movements, abnormally heavy menstrual flow, nosebleeds or any other unusual bleeding. . Severe decrease  in platelets which can increase your risk of bleeding. . Rare blood vessels disorder called thrombotic microangiopathy which can affect some of your organs in your body such as your kidneys. This can rarely be life-threatening. . Changes in your liver function which can very rarely cause liver failure which can rarely be lifethreatening. . Increased risk of toxicity or life-threatening events when used in combination with specific chemotherapy agents when used in patients who are not eligible for transplant. . Changes in your central nervous system can happen. The central nervous system is made up of your brain and spinal cord. You could feel extreme tiredness, agitation, confusion, hallucinations (see or hear things that are not there), trouble understanding or speaking, loss of control of your bowels or bladder, eyesight changes, numbness or lack of strength to your arms, legs, face, or body, and coma. If you start to have any of these symptoms let your doctor know right away. Note: Some of the side effects above are very rare. If you have concerns and/or questions, please discuss them with your medical team.  Important Information . This drug may be present in the saliva, tears, sweat, urine, stool, vomit, semen, and vaginal secretions. Talk to your doctor and/or your nurse about the necessary precautions to take during this time.  Treating Side Effects . Manage tiredness by pacing your activities for the day. . Be sure to include periods of rest between energy-draining activities. . Take your temperature as your doctor or nurse tells you, and whenever you feel like you may have a fever. . To help decrease bleeding, use a soft toothbrush. Check with your nurse before using dental floss. . Be very careful when using knives or tools. . Use an electric shaver instead of a razor. . Drink plenty of fluids (a minimum of eight glasses per day is recommended). . If you throw up or have loose bowel  movements, you should drink more fluids so that you do not become dehydrated (lack water in the body from losing too much fluid). . To help with nausea and vomiting, eat small, frequent meals instead of three large meals a day. Choose foods and drinks that are at room temperature. Ask your nurse or doctor about other helpful tips and medicine that is available to help or stop lessen these symptoms. . If you get diarrhea, eat low-fiber foods that are high in protein and calories and avoid foods that can irritate your digestive tracts or lead to cramping. . Ask your nurse or doctor about medicine that can lessen or stop your diarrhea. Marland Kitchen Keeping your pain under control is important to your well-being. Please tell your doctor or nurse if you are experiencing pain. . Infusion reactions may occur after your infusion. If this happens, call 911 for emergency care.  Food and Drug Interactions . There are no known interactions of carfilzomib with food and with other medications. . Tell your doctor and pharmacist about all the medicines and dietary supplements (vitamins, minerals, herbs and others) that you are taking at this time. The safety and use of dietary supplements and alternative diets are often not  known. Using these might affect your cancer or interfere with your treatment. Until more is known, you should not use dietary supplements or alternative diets without your cancer doctor's help.  When to Call the Doctor Call your doctor or nurse if you have any of these symptoms and/or any new or unusual symptoms: . Fever of 100.5 F (38 C) or higher . Chills . Fatigue that interferes with your daily activities . Feeling dizzy or lightheaded . Easy bleeding or bruising . Coughing up yellow, green, or bloody mucus . Wheezing or trouble breathing . Chest pain or symptoms of a heart attack. Most heart attacks involve pain in the center of the chest that lasts more than a few minutes. The pain may go away  and come back or it can be constant. It can feel like pressure, squeezing, fullness, or pain. Sometimes pain is felt in one or both arms, the back, neck, jaw, or stomach. If any of these symptoms last 2 minutes, call 911. . Blood in your urine, vomit (bright red or coffee-ground) and/or stools ( bright red, or black/tarry) . Coughing up blood . A headache that does not go away . Blurry vision or other changes in eyesight . Your leg or arm is swollen, red, warm and/or painful . Confusion and/or agitation . Hallucinations . Trouble understanding or speaking . Numbness or lack of strength to your arms, legs, face, or body . Signs of infusion reaction: fever or shaking chills, flushing, facial swelling, feeling dizzy, headache, trouble breathing, rash, itching, chest tightness, or chest pain. . Signs of possible liver problems: dark urine, pale bowel movements, bad stomach pain, feeling very tired and weak, unusual itching, or yellowing of the eyes or skin . Decreased urine, or very dark urine . Nausea that stops you from eating or drinking and/or is not relieved by prescribed medicines . Throwing up more than 3 times a day . Loose bowel movements (diarrhea) 4 times a day or loose bowel movements with lack of strength or a feeling of being dizzy . Weight gain of 5 pounds in one week (fluid retention) . Pain that does not go away or is not relieved by prescribed medicine . If you think you may be pregnant or may have impregnated your partner   Reproduction Warnings . Pregnancy warning: This drug can have harmful effects on the unborn baby. Women of child bearing potential should use effective methods of birth control during your cancer treatment and for at least 1 month after treatment. Men with male partners of child bearing potential should use effective methods of birth control during your cancer treatment and for at least 3 months after your cancer treatment. Let your doctor know right away if  you think you may be pregnant or may have impregnated your partner. . Breastfeeding warning: It is not known if this drug passes into breast milk. For this reason, women should talk to their doctor about the risks and benefits of breast feeding during treatment with this drug because this drug may enter the breast milk and cause harm to a breast feeding baby . Fertility warning: This drug is not expected to affect your ability to have children in the future. Human fertility studies have not been done with this drug.    Cyclophosphamide (Cytoxan)  About This Drug Cyclophosphamide is used to treat cancer. It is given orally (by mouth) and in the vein (IV).  This takes 30 minutes to infuse.  Possible Side Effects . A decrease in  the number of white blood cells which may raise your risk of infection . Infection . Fever . Fever in the setting of decreased white blood cells, which is a serious condition that can be lifethreatening . Nausea and throwing up (vomiting) . Loose bowel movements (diarrhea) . Hair loss. Hair loss is often temporary, although with certain medicine, hair loss can sometimes be permanent. Hair loss may happen suddenly or gradually. If you lose hair, you may lose it from your head, face, armpits, pubic area, chest, and/or legs. You may also notice your hair getting thin. Note: Not all possible side effects are included above.  Warnings and Precautions . Severe bone marrow depression, which can be life-threatening. This is a decrease in the number of white blood cells, red blood cells, and platelets. This may raise your risk of infection, make you tired and weak (fatigue), and raise your risk of bleeding. . Abnormal heart beat and/or changes in the tissue of the heart, which can be life-threatening. Some changes may happen that can cause your heart to have less ability to pump blood. . Effects on the bladder and kidneys that may be life-threatening. This drug may cause  inflammation (swelling), irritation and bleeding in the bladder and/or kidneys. You may have blood in your urine. . Changes in your liver function and blockage of small veins in the liver, which can cause liver failure and be life-threatening . Inflammation (swelling) of the lungs, and changes to the small vesels of your lungs. You may have a dry cough or trouble breathing. . This drug may cause slow wound healing . Electrolyte changes, especially a decrease in sodium which can be life-threatening . This drug may raise your risk of getting a second cancer . These side effects may be more severe if you are receiving high doses of this medication included in pre-transplant chemotherapy Note: Some of the side effects above are very rare. If you have concerns and/or questions, please discuss them with your medical team.  Important Information . If you must have emergency surgery or have an accident that results in a wound, tell the doctor that you are on cyclophosphamide.  Treating Side Effects . Manage tiredness by pacing your activities for the day. . Be sure to include periods of rest between energy-draining activities. . To decrease infection, wash your hands regularly. . Avoid close contact with people who have a cold, the flu, or other infections. . Take your temperature as your doctor or nurse tells you, and whenever you feel like you may have a fever. . To help decrease bleeding, use a soft toothbrush. Check with your nurse before using dental floss. . Be very careful when using knives or tools. . Use an electric shaver instead of a razor. Marland Kitchen Keeping your pain under control is important to your well-being. Please tell your doctor or nurse if you are experiencing pain. . Drink plenty of fluids (a minimum of eight glasses per day is recommended). . If you throw up or have loose bowel movements, you should drink more fluids so that you do not become dehydrated (lack water in the body from  losing too much fluid). . If you get diarrhea, eat low-fiber foods that are high in protein and calories and avoid foods that can irritate your digestive tracts or lead to cramping. . Ask your nurse or doctor about medicine that can lessen or stop your diarrhea. . To help with nausea and vomiting, eat small, frequent meals instead of three large  meals a day. Choose foods and drinks that are at room temperature. Ask your nurse or doctor about other helpful tips and medicine that is available to help or stop lessen these symptoms. . To help with hair loss, wash with a mild shampoo and avoid washing your hair every day. . Avoid rubbing your scalp, pat your hair or scalp dry. . Avoid coloring your hair. . Limit your use of hair spray, electric curlers, blow dryers, and curling irons. . If you are interested in getting a wig, talk to your nurse. You can also call the [facility] at 800-ACS-2345 to find out information about the "Look Good, Feel Better" program close to where you live. It is a free program where women getting chemotherapy can learn about wigs, turbans and scarves as well as makeup techniques and skin and nail care.  Food and Drug Interactions . There are no known interactions of cyclophosphamide with food. . Check with your doctor or pharmacist about all other prescription medicines and over-the-counter medicines and dietary supplements (vitamins, minerals, herbs and others) you are taking before starting this medicine as there are known drug interactions with cyclophosphamide Also, check with your doctor or pharmacist before starting any new prescription or over-the-counter medicines, or dietary supplement to make sure that there are no interactions.  When to Call the Doctor Call your doctor or nurse if you have any of these symptoms and/or any new or unusual symptoms: . Fever of 100.5 F (38 C) or higher . Chills . Fatigue that interferes with your daily activities .  Feeling dizzy or lightheaded . Easy bleeding or bruising . Dry cough . Trouble breathing . Feeling that your heart is beating in a fast or not normal way (palpitations) . Pain in your chest . Pain in your mouth or throat that makes it hard to eat or drink . Nausea that stops you from eating or drinking and/or is not relieved by prescribed medicines . Throwing up more than 3 times a day . Loose bowel movements (diarrhea) 4 times a day or loose bowel movements with lack of strength or a feeling of being dizzy . Decreased urine, or very dark urine . Pain when passing urine; blood in urine . Decreased urine or difficulty urinating . Signs of possible liver problems: dark urine, pale bowel movements, bad stomach pain, feeling very tired and weak, unusual itching, or yellowing of the eyes or skin . If you think you may be pregnant or may have impregnated your partner  Reproduction Warnings . Pregnancy warning: This drug can have harmful effects on the unborn baby. Women of childbearing potential should use effective methods of birth control during your cancer treatment and for at least 1 year after treatment. Men with male partners who are pregnant or may become pregnant should use a condom during your cancer treatment and for at least 4 months after your cancer treatment. Let your doctor know right away if you think you may be pregnant or may have impregnated your partner. . Breastfeeding warning: This drug passes into breast milk. For this reason, women should talk to their doctor about the risks and benefits of breast feeding during treatment with this drug because this drug may enter the breast milk and cause harm to a breast feeding baby. . Fertility warning: In men and women both, this drug may affect your ability to have children in the future. Talk with your doctor or nurse if you plan to have children. Ask for information  on sperm or egg banking.    SELF CARE ACTIVITIES WHILE ON  CHEMOTHERAPY: Hydration Increase your fluid intake 48 hours prior to treatment and drink at least 8 to 12 cups (64 ounces) of water/decaff beverages per day after treatment. You can still have your cup of coffee or soda but these beverages do not count as part of your 8 to 12 cups that you need to drink daily. No alcohol intake.  Medications Continue taking your normal prescription medication as prescribed.  If you start any new herbal or new supplements please let us know first to make sure it is safe.  Mouth Care Have teeth cleaned professionally before starting treatment. Keep dentures and partial plates clean. Use soft toothbrush and do not use mouthwashes that contain alcohol. Biotene is a good mouthwash that is available at most pharmacies or may be ordered by calling [phone]. Use warm salt water gargles (1 teaspoon salt per 1 quart warm water) before and after meals and at bedtime. Or you may rinse with 2 tablespoons of three-percent hydrogen peroxide mixed in eight ounces of water. If you are still having problems with your mouth or sores in your mouth please call the clinic. If you need dental work, please let the doctor know before you go for your appointment so that we can coordinate the best possible time for you in regards to your chemo regimen. You need to also let your dentist know that you are actively taking chemo. We may need to do labs prior to your dental appointment.  Skin Care Always use sunscreen that has not expired and with SPF (Sun Protection Factor) of 50 or higher. Wear hats to protect your head from the sun. Remember to use sunscreen on your hands, ears, face, & feet.  Use good moisturizing lotions such as udder cream, eucerin, or even Vaseline. Some chemotherapies can cause dry skin, color changes in your skin and nails.    . Avoid long, hot showers or baths. . Use gentle, fragrance-free soaps and laundry detergent. . Use moisturizers, preferably creams or  ointments rather than lotions because the thicker consistency is better at preventing skin dehydration. Apply the cream or ointment within 15 minutes of showering. Reapply moisturizer at night, and moisturize your hands every time after you wash them.  Hair Loss (if your doctor says your hair will fall out)  . If your doctor says that your hair is likely to fall out, decide before you begin chemo whether you want to wear a wig. You may want to shop before treatment to match your hair color. . Hats, turbans, and scarves can also camouflage hair loss, although some people prefer to leave their heads uncovered. If you go bare-headed outdoors, be sure to use sunscreen on your scalp. . Cut your hair short. It eases the inconvenience of shedding lots of hair, but it also can reduce the emotional impact of watching your hair fall out. . Don't perm or color your hair during chemotherapy. Those chemical treatments are already damaging to hair and can enhance hair loss. Once your chemo treatments are done and your hair has grown back, it's OK to resume dyeing or perming hair. With chemotherapy, hair loss is almost always temporary. But when it grows back, it may be a different color or texture. In older adults who still had hair color before chemotherapy, the new growth may be completely gray.  Often, new hair is very fine and soft. Infection Prevention Please wash your hands for  at least 30 seconds using warm soapy water. Handwashing is the #1 way to prevent the spread of germs. Stay away from sick people or people who are getting over a cold. If you develop respiratory systems such as green/yellow mucus production or productive cough or persistent cough let us know and we will see if you need an antibiotic. It is a good idea to keep a pair of gloves on when going into grocery stores/[employer] to decrease your risk of coming into contact with germs on the carts, etc. Carry alcohol hand gel with you at all times and  use it frequently if out in public. If your temperature reaches 100.5 or higher please call the clinic and let us know.  If it is after hours or on the weekend please go to the ER if your temperature is over 100.5.  Please have your own personal thermometer at home to use.    Sex and bodily fluids If you are going to have sex, a condom must be used to protect the person that isn't taking chemotherapy. Chemo can decrease your libido (sex drive). For a few days after chemotherapy, chemotherapy can be excreted through your bodily fluids.  When using the toilet please close the lid and flush the toilet twice.  Do this for a few day after you have had chemotherapy.   Effects of chemotherapy on your sex life Some changes are simple and won't last long. They won't affect your sex life permanently. Sometimes you may feel: . too tired . not strong enough to be very active . sick or sore  . not in the mood . anxious or low Your anxiety might not seem related to sex. For example, you may be worried about the cancer and how your treatment is going. Or you may be worried about money, or about how you family are coping with your illness. These things can cause stress, which can affect your interest in sex. It's important to talk to your partner about how you feel. Remember - the changes to your sex life don't usually last long. There's usually no medical reason to stop having sex during chemo. The drugs won't have any long term physical effects on your performance or enjoyment of sex. Cancer can't be passed on to your partner during sex  Contraception It's important to use reliable contraception during treatment. Avoid getting pregnant while you or your partner are having chemotherapy. This is because the drugs may harm the baby. Sometimes chemotherapy drugs can leave a man or woman infertile.  This means you would not be able to have children in the future. You might want to talk to someone about permanent  infertility. It can be very difficult to learn that you may no longer be able to have children. Some people find counselling helpful. There might be ways to preserve your fertility, although this is easier for men than for women. You may want to speak to a fertility expert. You can talk about sperm banking or harvesting your eggs. You can also ask about other fertility options, such as donor eggs. If you have or have had breast cancer, your doctor might advise you not to take the contraceptive pill. This is because the hormones in it might affect the cancer.  It is not known for sure whether or not chemotherapy drugs can be passed on through semen or secretions from the vagina. Because of this some doctors advise people to use a barrier method if you have sex during  treatment. This applies to vaginal, anal or oral sex. Generally, doctors advise a barrier method only for the time you are actually having the treatment and for about a week after your treatment. Advice like this can be worrying, but this does not mean that you have to avoid being intimate with your partner. You can still have close contact with your partner and continue to enjoy sex.  Animals If you have cats or birds we just ask that you not change the litter or change the cage.  Please have someone else do this for you while you are on chemotherapy.   Food Safety During and After Cancer Treatment Food safety is important for people both during and after cancer treatment. Cancer and cancer treatments, such as chemotherapy, radiation therapy, and stem cell/bone marrow transplantation, often weaken the immune system. This makes it harder for your body to protect itself from foodborne illness, also called food poisoning. Foodborne illness is caused by eating food that contains harmful bacteria, parasites, or viruses.  Foods to avoid Some foods have a higher risk of becoming tainted with bacteria. These include: Marland Kitchen Unwashed fresh fruit and  vegetables, especially leafy vegetables that can hide dirt and other contaminants . Raw sprouts, such as alfalfa sprouts . Raw or undercooked beef, especially ground beef, or other raw or undercooked meat and poultry . Fatty, fried, or spicy foods immediately before or after treatment.  These can sit heavy on your stomach and make you feel nauseous. . Raw or undercooked shellfish, such as oysters. . Sushi and sashimi, which often contain raw fish.  . Unpasteurized beverages, such as unpasteurized fruit juices, raw milk, raw yogurt, or cider . Undercooked eggs, such as soft boiled, over easy, and poached; raw, unpasteurized eggs; or foods made with raw egg, such as homemade raw cookie dough and homemade mayonnaise Simple steps for food safety Shop smart. . Do not buy food stored or displayed in an unclean area. . Do not buy bruised or damaged fruits or vegetables. . Do not buy cans that have cracks, dents, or bulges. . Pick up foods that can spoil at the end of your shopping trip and store them in a cooler on the way home. Prepare and clean up foods carefully. . Rinse all fresh fruits and vegetables under running water, and dry them with a clean towel or paper towel. . Clean the top of cans before opening them. . After preparing food, wash your hands for 20 seconds with hot water and soap. Pay special attention to areas between fingers and under nails. . Clean your utensils and dishes with hot water and soap. Marland Kitchen Disinfect your kitchen and cutting boards using 1 teaspoon of liquid, unscented bleach mixed into 1 quart of water.   Dispose of old food. . Eat canned and packaged food before its expiration date (the "use by" or "best before" date). . Consume refrigerated leftovers within 3 to 4 days. After that time, throw out the food. Even if the food does not smell or look spoiled, it still may be unsafe. Some bacteria, such as Listeria, can grow even on foods stored in the refrigerator if they are  kept for too long. Take precautions when eating out. . At restaurants, avoid buffets and salad bars where food sits out for a long time and comes in contact with many people. Food can become contaminated when someone with a virus, often a norovirus, or another "bug" handles it. . Put any leftover food in a "to-go"  container yourself, rather than having the server do it. And, refrigerate leftovers as soon as you get home. . Choose restaurants that are clean and that are willing to prepare your food as you order it cooked.    MEDICATIONS: Acyclovir (ZOVIRAX) 400 MG tablet:  Take 1 tablet (400 mg total) by mouth 2 (two) times daily.   Dexamethasone '4mg'$  tablet. Take 10 tablets ('40mg'$ ) on days 1,8,15, and 22 of chemo. Repeat every 28 days. Take with food.                                                                                                                                                 Zofran/Ondansetron '8mg'$  tablet. Take 1 tablet every 8 hours as needed for nausea/vomiting. (#1 nausea med to take, this can constipate)  Compazine/Prochlorperazine '10mg'$  tablet. Take 1 tablet every 6 hours as needed for nausea/vomiting. (#2 nausea med to take, this can make you sleepy)    Over-the-Counter Meds:  Miralax 1 capful in 8 oz of fluid daily. May increase to two times a day if needed. This is a stool softener. If this doesn't work proceed you can add:  Senokot S-start with 1 tablet two times a day and increase to 4 tablets two times a day if needed. (total of 8 tablets in a 24 hour period). This is a stimulant laxative.   Call us if this does not help your bowels move.   Imodium '2mg'$  capsule. Take 2 capsules after the 1st loose stool and then 1 capsule every 2 hours until you go a total of 12 hours without having a loose stool. Call the [facility] if loose stools continue. If diarrhea occurs @ bedtime, take 2 capsules @ bedtime. Then take 2 capsules every 4 hours until morning. Call [facility].    Constipation Sheet *Miralax in 8 oz of fluid daily.  May increase to two times a day if needed.  This is a stool softener.  If this not enough to keep your bowel regular:  You can add:  *Senokot S, start with one tablet twice a day and can increase to 4 tablets twice a day if needed.  This is a stimulant laxative.   Sometimes when you take pain medication you need BOTH a medicine to keep your stool soft and a medicine to help your bowel push it out!  Please call if the above does not work for you.   Do not go more than 2 days without a bowel movement.  It is very important that you do not become constipated.  It will make you feel sick to your stomach (nausea) and can cause abdominal pain and vomiting.    Diarrhea Sheet  If you are having loose stools/diarrhea, please purchase Imodium and begin taking as outlined:  At the first sign of poorly formed or loose stools  you should begin taking Imodium(loperamide) 2 mg capsules.  Take two caplets ('4mg'$ ) followed by one caplet ('2mg'$ ) every 2 hours until you have had no diarrhea for 12 hours.  During the night take two caplets ('4mg'$ ) at bedtime and continue every 4 hours during the night until the morning.  Stop taking Imodium only after there is no sign of diarrhea for 12 hours.    Always call the [facility] if you are having loose stools/diarrhea that you can't get under control.  Loose stools/disrrhea leads to dehydration (loss of water) in your body.  We have other options of trying to get the loose stools/diarrhea to stopped but you must let us know!    Nausea Sheet  Zofran/Ondansetron '8mg'$  tablet. Take 1 tablet every 8 hours as needed for nausea/vomiting. (#1 nausea med to take, this can constipate)  Compazine/Prochlorperazine '10mg'$  tablet. Take 1 tablet every 6 hours as needed for nausea/vomiting. (#2 nausea med to take, this can make you sleepy)  You can take these medications together or separately.  We would first like for  you to try the Ondansetron by itself and then take the Prochlorperazine if needed. But you are allowed to take both medications at the same time if your nausea is that severe.  If you are having persistent nausea (nausea that does not stop) please take these medications on a staggered schedule so that the nausea medication stays in your body.  Please call the [facility] and let us know the amount of nausea that you are experiencing.  If you begin to vomit, you need to call the [facility] and if it is the weekend and you have vomited more than one time and cant get it to stop-go to the Emergency Room.  Persistent nausea/vomiting can lead to dehydration (loss of fluid in your body) and will make you feel terrible.   Ice chips, sips of clear liquids, foods that are @ room temperature, crackers, and toast tend to be better tolerated.    SYMPTOMS TO REPORT AS SOON AS POSSIBLE AFTER TREATMENT:  FEVER GREATER THAN 100.5 F  CHILLS WITH OR WITHOUT FEVER  NAUSEA AND VOMITING THAT IS NOT CONTROLLED WITH YOUR NAUSEA MEDICATION  UNUSUAL SHORTNESS OF BREATH  UNUSUAL BRUISING OR BLEEDING  TENDERNESS IN MOUTH AND THROAT WITH OR WITHOUT PRESENCE OF ULCERS  URINARY PROBLEMS  BOWEL PROBLEMS  UNUSUAL RASH    Wear comfortable clothing and clothing appropriate for easy access to any Portacath or PICC line. Let us know if there is anything that we can do to make your therapy better!    What to do if you need assistance after hours or on the weekends: CALL [phone].  HOLD on the line, do not hang up.  You will hear multiple messages but at the end you will be connected with a nurse triage line.  They will contact the doctor if necessary.  Most of the time they will be able to assist you.  Do not call the hospital operator.    I have been informed and understand all of the instructions given to me and have received a copy. I have been instructed to call the clinic [phone] or my family  physician as soon as possible for continued medical care, if indicated. I do not have any more questions at this time but understand that I may call the [facility] or the Patient Navigator at [phone] during office hours should I have questions or need assistance in obtaining  follow-up care.

## 2018-02-09 NOTE — Telephone Encounter (Signed)
Called pt to explained how to take his dexamethasone.  He will take 10 tablets (40 mg) weekly.  He can take this every Monday before he comes in for chemotherapy.  He verbalized understanding.

## 2018-02-09 NOTE — Patient Instructions (Addendum)
[facility] at [facility] Discharge Instructions  RECOMMENDATIONS MADE BY THE CONSULTANT AND ANY TEST RESULTS WILL BE SENT TO YOUR REFERRING PHYSICIAN.  You were seen today by Dr. 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  You should tolerate this okay. Dr. Rodriguez will determine when the maintenance dosing will stop  Return to see MD in March with treatment  We will schedule your treatment for Monday and Tuesday of next week/labs same day  Plasma cells are the type of cells that define Multiple Myeloma. In August, your bone marrow bx showed 20-30% plasma cells. The bone marrow bx you recently had only showed 2-3% plasma cells.     Thank you for choosing [facility] at [facility] to provide your oncology and hematology care.  To afford each patient quality time with our provider, please arrive at least 15 minutes before your scheduled appointment time.    If you have a lab appointment with the Cancer Center please come in thru the  Main Entrance and check in at the main information desk  You need to re-schedule your appointment should you arrive 10 or more minutes late.  We strive to give you quality time with our providers, and arriving late affects you and other patients whose appointments are after yours.  Also, if you no show three or more times for appointments you may be dismissed from the clinic at the providers discretion.     Again, thank you for choosing [facility].  Our hope is that these requests will decrease the amount of time that you wait before being seen by our physicians.       _____________________________________________________________  Should you have questions after your visit to [facility], please contact our office at [phone]  [phone] between the hours of 8:30 a.m. and 4:30 p.m.  Voicemails left after 4:30 p.m. will not be returned until the following business day.  For prescription refill requests, have your pharmacy contact our office.       Resources For Cancer Patients and their Caregivers ? American Cancer Society: Can assist with transportation, wigs, general needs, runs Look Good Feel Better.        1-888-227-6333 ? Cancer Care: Provides financial assistance, online support groups, medication/co-pay assistance.  1-800-813-HOPE (4673) ? [facility] Assists [place] cancer patients and their families through emotional , educational and financial support.  336-427-4357 ? [place] DSS Where to apply for food stamps, Medicaid and utility assistance. 336-342-1394 ? RCATS: Transportation to medical appointments. 336-347-2287 ? Social Security Administration: May apply for disability if have a Stage IV cancer. 336-342-7796 1-800-772-1213 ? [place] Aging, Disability and Transit Services: Assists with nutrition, care and transit needs. 336-349-2343  Cancer Center Support Programs: [email or web address]@ > Cancer Support Group  2nd Tuesday of the month 1pm-2pm, Journey Room  > Creative Journey  3rd Tuesday of the month 1130am-1pm, Journey Room  > Look Good Feel Better  1st Wednesday of the month 10am-12 noon, Journey Room (Call American Cancer Society to register 1-800-395-5775)    

## 2018-02-11 ENCOUNTER — Other Ambulatory Visit (HOSPITAL_COMMUNITY): Payer: Self-pay | Admitting: *Deleted

## 2018-02-14 ENCOUNTER — Encounter (HOSPITAL_COMMUNITY): Payer: Self-pay

## 2018-02-14 ENCOUNTER — Inpatient Hospital Stay (HOSPITAL_COMMUNITY): Payer: Medicare Other

## 2018-02-14 ENCOUNTER — Encounter: Payer: Self-pay | Admitting: Oncology

## 2018-02-14 ENCOUNTER — Other Ambulatory Visit (HOSPITAL_COMMUNITY): Payer: Medicare Other

## 2018-02-14 ENCOUNTER — Ambulatory Visit (HOSPITAL_COMMUNITY): Payer: Medicare Other

## 2018-02-14 VITALS — BP 130/72 | HR 65 | Temp 97.9°F | Resp 18 | Wt 200.2 lb

## 2018-02-14 DIAGNOSIS — Z5111 Encounter for antineoplastic chemotherapy: Secondary | ICD-10-CM | POA: Diagnosis not present

## 2018-02-14 DIAGNOSIS — Z170 Estrogen receptor positive status [ER+]: Secondary | ICD-10-CM | POA: Diagnosis not present

## 2018-02-14 DIAGNOSIS — C9000 Multiple myeloma not having achieved remission: Secondary | ICD-10-CM

## 2018-02-14 DIAGNOSIS — C50122 Malignant neoplasm of central portion of left male breast: Secondary | ICD-10-CM | POA: Diagnosis not present

## 2018-02-14 DIAGNOSIS — C7951 Secondary malignant neoplasm of bone: Secondary | ICD-10-CM | POA: Diagnosis not present

## 2018-02-14 DIAGNOSIS — I10 Essential (primary) hypertension: Secondary | ICD-10-CM | POA: Diagnosis not present

## 2018-02-14 LAB — COMPREHENSIVE METABOLIC PANEL
ALBUMIN: 3.8 g/dL (ref 3.5–5.0)
ALK PHOS: 26 U/L — AB (ref 38–126)
ALT: 13 U/L — AB (ref 17–63)
AST: 20 U/L (ref 15–41)
Anion gap: 8 (ref 5–15)
BILIRUBIN TOTAL: 0.1 mg/dL — AB (ref 0.3–1.2)
BUN: 15 mg/dL (ref 6–20)
CALCIUM: 8.8 mg/dL — AB (ref 8.9–10.3)
CO2: 24 mmol/L (ref 22–32)
CREATININE: 1.27 mg/dL — AB (ref 0.61–1.24)
Chloride: 109 mmol/L (ref 101–111)
GFR calc non Af Amer: 56 mL/min — ABNORMAL LOW (ref >60)
GLUCOSE: 101 mg/dL — AB (ref 65–99)
Potassium: 4.2 mmol/L (ref 3.5–5.1)
SODIUM: 141 mmol/L (ref 135–145)
TOTAL PROTEIN: 5.9 g/dL — AB (ref 6.5–8.1)

## 2018-02-14 LAB — CBC WITH DIFFERENTIAL/PLATELET
Basophils Absolute: 0 10*3/uL (ref 0.0–0.1)
Basophils Relative: 0 %
EOS ABS: 0.2 10*3/uL (ref 0.0–0.7)
Eosinophils Relative: 4 %
HEMATOCRIT: 32.1 % — AB (ref 39.0–52.0)
HEMOGLOBIN: 10.6 g/dL — AB (ref 13.0–17.0)
LYMPHS ABS: 1.2 10*3/uL (ref 0.7–4.0)
Lymphocytes Relative: 23 %
MCH: 33.7 pg (ref 26.0–34.0)
MCHC: 33 g/dL (ref 30.0–36.0)
MCV: 101.9 fL — ABNORMAL HIGH (ref 78.0–100.0)
MONOS PCT: 7 %
Monocytes Absolute: 0.4 10*3/uL (ref 0.1–1.0)
Neutro Abs: 3.5 10*3/uL (ref 1.7–7.7)
Neutrophils Relative %: 66 %
Platelets: 162 10*3/uL (ref 150–400)
RBC: 3.15 MIL/uL — AB (ref 4.22–5.81)
RDW: 13.5 % (ref 11.5–15.5)
WBC: 5.3 10*3/uL (ref 4.0–10.5)

## 2018-02-14 LAB — LACTATE DEHYDROGENASE: LDH: 140 U/L (ref 98–192)

## 2018-02-14 MED ORDER — CARFILZOMIB CHEMO INJECTION 60 MG
20.0000 mg/m2 | Freq: Once | INTRAVENOUS | Status: AC
  Administered 2018-02-14: 42 mg via INTRAVENOUS
  Filled 2018-02-14: qty 21

## 2018-02-14 MED ORDER — PALONOSETRON HCL INJECTION 0.25 MG/5ML
0.2500 mg | Freq: Once | INTRAVENOUS | Status: AC
  Administered 2018-02-14: 0.25 mg via INTRAVENOUS
  Filled 2018-02-14: qty 5

## 2018-02-14 MED ORDER — SODIUM CHLORIDE 0.9 % IV SOLN
Freq: Once | INTRAVENOUS | Status: AC
  Administered 2018-02-14: 11:00:00 via INTRAVENOUS

## 2018-02-14 MED ORDER — SODIUM CHLORIDE 0.9 % IV SOLN: 10.0000 mg | Freq: Once | INTRAVENOUS | Status: DC

## 2018-02-14 MED ORDER — SODIUM CHLORIDE 0.9 % IV SOLN
Freq: Once | INTRAVENOUS | Status: AC
  Administered 2018-02-14: 10:00:00 via INTRAVENOUS

## 2018-02-14 MED ORDER — DEXAMETHASONE SODIUM PHOSPHATE 10 MG/ML IJ SOLN
10.0000 mg | Freq: Once | INTRAMUSCULAR | Status: AC
Start: 2018-02-14 — End: 2018-02-14
  Administered 2018-02-14: 10 mg via INTRAVENOUS
  Filled 2018-02-14: qty 1

## 2018-02-14 MED ORDER — SODIUM CHLORIDE 0.9 % IV SOLN
300.0000 mg/m2 | Freq: Once | INTRAVENOUS | Status: AC
  Administered 2018-02-14: 640 mg via INTRAVENOUS
  Filled 2018-02-14: qty 32

## 2018-02-14 NOTE — Patient Instructions (Signed)
[facility] Discharge Instructions for Patients Receiving Chemotherapy   Beginning January 23rd 2017 lab work for the [facility] will be done in the  Main lab at [facility] on 1st floor. If you have a lab appointment with the [facility] please come in thru the  Main Entrance and check in at the main information desk   Today you received the following chemotherapy agents Kyprolis and Cytoxan. Follow-up as scheduled. Call clinic for any questions or concerns  To help prevent nausea and vomiting after your treatment, we encourage you to take your nausea medication    If you develop nausea and vomiting, or diarrhea that is not controlled by your medication, call the clinic.  The clinic phone number is [phone]. Office hours are Monday-Friday 8:30am-5:00pm.  BELOW ARE SYMPTOMS THAT SHOULD BE REPORTED IMMEDIATELY:  *FEVER GREATER THAN 101.0 F  *CHILLS WITH OR WITHOUT FEVER  NAUSEA AND VOMITING THAT IS NOT CONTROLLED WITH YOUR NAUSEA MEDICATION  *UNUSUAL SHORTNESS OF BREATH  *UNUSUAL BRUISING OR BLEEDING  TENDERNESS IN MOUTH AND THROAT WITH OR WITHOUT PRESENCE OF ULCERS  *URINARY PROBLEMS  *BOWEL PROBLEMS  UNUSUAL RASH Items with * indicate a potential emergency and should be followed up as soon as possible. If you have an emergency after office hours please contact your primary care physician or go to the nearest emergency department.  Please call the clinic during office hours if you have any questions or concerns.   You may also contact the Patient Navigator at [phone] should you have any questions or need assistance in obtaining follow up care.      Resources For Cancer Patients and their Caregivers ? American Cancer Society: Can assist with transportation, wigs, general needs, runs Look Good Feel Better.        310 702 7117 ? Cancer Care: Provides financial assistance, online support groups, medication/co-pay assistance.   1-800-813-HOPE 332 871 2164) ? [facility] Assists [place] cancer patients and their families through emotional , educational and financial support.  281-379-9346 ? [place] DSS Where to apply for food stamps, Medicaid and utility assistance. 401-481-1611 ? RCATS: Transportation to medical appointments. 507-098-2654 ? Social Security Administration: May apply for disability if have a Stage IV cancer. 856-195-9593 289-819-5571 ? [employer], Disability and Transit Services: Assists with nutrition, care and transit needs. 862-114-9869

## 2018-02-14 NOTE — Progress Notes (Signed)
Johnny Lucas tolerated chemo tx well without complaints or incident.Labs reviewed with Johnny Harp NP prior to administering chemotherapy.Peripheral IV site checked for blood return prior to and after each medication given VSS upon discharge. Pt discharged self ambulatory in satisfactory condition accompanied by his wife

## 2018-02-14 NOTE — Progress Notes (Signed)
Chemotherapy teaching reiterated.  Extensive teaching packet given.  Went over pts schedule.  Side effects explained and when to call the clinic.  Importance of fevers.  All questions answered.

## 2018-02-15 ENCOUNTER — Encounter (HOSPITAL_COMMUNITY): Payer: Self-pay

## 2018-02-15 ENCOUNTER — Encounter: Payer: Self-pay | Admitting: Oncology

## 2018-02-15 ENCOUNTER — Inpatient Hospital Stay (HOSPITAL_COMMUNITY): Payer: Medicare Other

## 2018-02-15 VITALS — BP 129/60 | HR 58 | Temp 98.2°F | Resp 20 | Wt 203.8 lb

## 2018-02-15 DIAGNOSIS — C9000 Multiple myeloma not having achieved remission: Secondary | ICD-10-CM | POA: Diagnosis not present

## 2018-02-15 DIAGNOSIS — Z5111 Encounter for antineoplastic chemotherapy: Secondary | ICD-10-CM | POA: Diagnosis not present

## 2018-02-15 DIAGNOSIS — Z170 Estrogen receptor positive status [ER+]: Secondary | ICD-10-CM | POA: Diagnosis not present

## 2018-02-15 DIAGNOSIS — C7951 Secondary malignant neoplasm of bone: Secondary | ICD-10-CM | POA: Diagnosis not present

## 2018-02-15 DIAGNOSIS — I10 Essential (primary) hypertension: Secondary | ICD-10-CM | POA: Diagnosis not present

## 2018-02-15 DIAGNOSIS — C50122 Malignant neoplasm of central portion of left male breast: Secondary | ICD-10-CM | POA: Diagnosis not present

## 2018-02-15 LAB — IMMUNOFIXATION ELECTROPHORESIS
IGA: 10 mg/dL — AB (ref 61–437)
IgG (Immunoglobin G), Serum: 577 mg/dL — ABNORMAL LOW (ref 700–1600)
IgM (Immunoglobulin M), Srm: 20 mg/dL (ref 20–172)
Total Protein ELP: 5.6 g/dL — ABNORMAL LOW (ref 6.0–8.5)

## 2018-02-15 LAB — KAPPA/LAMBDA LIGHT CHAINS
KAPPA FREE LGHT CHN: 17.7 mg/L (ref 3.3–19.4)
KAPPA, LAMDA LIGHT CHAIN RATIO: 2.33 — AB (ref 0.26–1.65)
Lambda free light chains: 7.6 mg/L (ref 5.7–26.3)

## 2018-02-15 LAB — PROTEIN ELECTROPHORESIS, SERUM
A/G RATIO SPE: 1.9 — AB (ref 0.7–1.7)
ALBUMIN ELP: 3.7 g/dL (ref 2.9–4.4)
ALPHA-1-GLOBULIN: 0.2 g/dL (ref 0.0–0.4)
ALPHA-2-GLOBULIN: 0.5 g/dL (ref 0.4–1.0)
Beta Globulin: 0.8 g/dL (ref 0.7–1.3)
GLOBULIN, TOTAL: 2 g/dL — AB (ref 2.2–3.9)
Gamma Globulin: 0.5 g/dL (ref 0.4–1.8)
M-Spike, %: 0.2 g/dL — ABNORMAL HIGH
Total Protein ELP: 5.7 g/dL — ABNORMAL LOW (ref 6.0–8.5)

## 2018-02-15 LAB — IGG, IGA, IGM
IGA: 10 mg/dL — AB (ref 61–437)
IGM (IMMUNOGLOBULIN M), SRM: 21 mg/dL (ref 20–172)
IgG (Immunoglobin G), Serum: 584 mg/dL — ABNORMAL LOW (ref 700–1600)

## 2018-02-15 LAB — BETA 2 MICROGLOBULIN, SERUM: Beta-2 Microglobulin: 2.4 mg/L (ref 0.6–2.4)

## 2018-02-15 MED ORDER — HEPARIN SOD (PORK) LOCK FLUSH 100 UNIT/ML IV SOLN: 500.0000 [IU] | Freq: Once | INTRAVENOUS | Status: DC | PRN

## 2018-02-15 MED ORDER — SODIUM CHLORIDE 0.9 % IV SOLN
Freq: Once | INTRAVENOUS | Status: AC
Start: 2018-02-15 — End: 2018-02-15
  Administered 2018-02-15: 11:00:00 via INTRAVENOUS

## 2018-02-15 MED ORDER — DEXAMETHASONE SODIUM PHOSPHATE 10 MG/ML IJ SOLN
10.0000 mg | Freq: Once | INTRAMUSCULAR | Status: AC
  Administered 2018-02-15: 10 mg via INTRAVENOUS
  Filled 2018-02-15: qty 1

## 2018-02-15 MED ORDER — DEXTROSE 5 % IV SOLN
20.0000 mg/m2 | Freq: Once | INTRAVENOUS | Status: AC
  Administered 2018-02-15: 42 mg via INTRAVENOUS
  Filled 2018-02-15: qty 21

## 2018-02-15 MED ORDER — SODIUM CHLORIDE 0.9 % IV SOLN
Freq: Once | INTRAVENOUS | Status: AC
  Administered 2018-02-15: 500 mL via INTRAVENOUS

## 2018-02-15 MED ORDER — SODIUM CHLORIDE 0.9% FLUSH
10.0000 mL | INTRAVENOUS | Status: DC | PRN
  Administered 2018-02-15: 10 mL
  Filled 2018-02-15: qty 10

## 2018-02-15 MED ORDER — SODIUM CHLORIDE 0.9 % IV SOLN: 10.0000 mg | Freq: Once | INTRAVENOUS | Status: DC

## 2018-02-15 NOTE — Progress Notes (Signed)
Patient tolerated chemotherapy with no complaints voiced.  Peripheral IV site clean and dry with no bruising or swelling noted at site.  No complaints of pain at site.  Band aid applied.  VSS with discharge and left ambulatory with wife with no s/s of distress noted.

## 2018-02-15 NOTE — Patient Instructions (Signed)
[facility] Discharge Instructions for Patients Receiving Chemotherapy  Today you received the following chemotherapy agents kyprolis today.    If you develop nausea and vomiting that is not controlled by your nausea medication, call the clinic.   BELOW ARE SYMPTOMS THAT SHOULD BE REPORTED IMMEDIATELY:  *FEVER GREATER THAN 100.5 F  *CHILLS WITH OR WITHOUT FEVER  NAUSEA AND VOMITING THAT IS NOT CONTROLLED WITH YOUR NAUSEA MEDICATION  *UNUSUAL SHORTNESS OF BREATH  *UNUSUAL BRUISING OR BLEEDING  TENDERNESS IN MOUTH AND THROAT WITH OR WITHOUT PRESENCE OF ULCERS  *URINARY PROBLEMS  *BOWEL PROBLEMS  UNUSUAL RASH Items with * indicate a potential emergency and should be followed up as soon as possible.  Feel free to call the clinic should you have any questions or concerns. The clinic phone number is [phone].  Please show the [facility] at check-in to the Emergency Department and triage nurse.

## 2018-02-16 NOTE — Progress Notes (Signed)
Diagnosis Multiple myeloma not having achieved remission ([place]) - Plan: CBC with Differential/Platelet, Comprehensive metabolic panel, Lactate dehydrogenase, Protein electrophoresis, serum, Protein Electrophoresis, Urine Rflx., IgG, IgA, IgM, DISCONTINUED: dexamethasone (DECADRON) 4 MG tablet  Staging Cancer Staging Multiple myeloma ([name] Staging form: Plasma Cell Myeloma and Plasma Cell Disorders, AJCC 8th Edition - Clinical stage from 05/03/2017: Beta-2-microglobulin (mg/L): 3.5, Albumin (g/dL): 3.4, ISS: Stage II, High-risk cytogenetics: Absent, LDH: Not assessed - Signed by Baird Cancer, PA-C on 05/03/2017   Assessment and Plan:   1. Multiple myeloma.  The patient is status post autologous stem cell transplant.  His M spike did not change with transplant however, bone marrow involvement did improve.  He has been seen by Dr. Norma Fredrickson on 01/26/2018 and is recommended that given the persistent M spike he undergo consolidative therapy with Cytoxan, carfilzomib and Decadron for 2 months.  This is the same therapy he received just prior to transplant.  This will be followed by maintenance carfilzomib dosed at 56 mg/m day 1 and day 15 every 28 days.  He will have labs monthly with CBC, CMP, SPEP,  UPEP,  quantitative immunoglobulin and serum free light chains.  This will continue for the remainder of the first year after transplant.  His vaccines will be given at [facility] after his 66-monthvisit.  The patient is recommended to remain on antiviral prophylactic antibiotics for a year.  Bactrim or dapsone should be continued until 6 months after transplant.  Acyclovir will be given for total of 1 year from the day of transplant.  He will continue to be seen every 3 months at [facility] for monitoring of progress.    He will be set up for chemotherapy teaching.   He will be seen monthly as treatment proceeds.  All questions answered he expressed understanding of the information presented.  2.  Breast  cancer.  Hormone therapy is on hold for 1 month from transplant date per Dr. RNorma Fredricksonto reduce risk of VOD or other complications.  3.  Hypertension.  Continue to follow-up with PCP for management.     INTERVAL HISTORY:  Per Dr. ZTalbert Cage  Current Status:  72y.o. male here for follow-up for multiple myeloma and Stage IV metastatic breast cancer.   Patient reported a very rough time with his autotransplant which he underwent on 11/08/17.   [facility].       Breast cancer, male ([place]   12/31/2009 Initial Biopsy    Biopsy of L breast       12/31/2009 Pathology Results    Invasive ductal carcinoma, ER/PR+, HER 2 negative      12/31/2009 Imaging    Ultrasound showing a 2.43 x 1.85 x 3 cm hypoechoic spiculated mass in the 12 o clock L breast retroareolar region      01/01/2010 -  Anti-estrogen oral therapy    Tamoxifen 20 mg daily      01/06/2010 Imaging    Bone scan abnormal uptake in the diaphysis of the R humerus, abnormal in the R third, fifth and sixth ribs, lesion also noted in the sternum.      02/03/2010 Surgery    Rod placement and fixation of R humerus by Dr. GAmedeo Plenty     02/05/2010 - 02/18/2010 Radiation Therapy    30Gy in 10 fractions of 3 Gy per fraction to R pathologic fracture      03/11/2010 -  Chemotherapy    Denosumab monthly, now every 3 months. Started at [facility]       06/09/2016 Imaging    Three hypermetabolic osseous lesions in the sternum, left ilium and right ilium, as discussed above, likely represent osseous metastases. At this time, these are not recognizable on the CT images. 2. No extra skeletal metastatic disease identified in the neck, chest, abdomen or pelvis.      10/13/2016 Progression    PET shows various new and enlarging osseous metastatic lesions with no definite extra osseous metastatic disease currently identified.       12/31/2016 Progression    1. Multifocal  hypermetabolic osseous metastases throughout the axial and proximal appendicular skeleton, which are increased in size, number and metabolism since 10/13/2016 PET-CT. 2. 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 3. No additional sites of hypermetabolic metastatic disease. 4. Chronic right mastoid sinusitis. 5. Aortic atherosclerosis.  One vessel coronary atherosclerosis.       Multiple myeloma ([place])   02/12/2017 Bone Marrow Biopsy    The marrow was variably cellular with large peritrabecular aggregates of kappa restricted plasma cells (66% by aspirate, 30% by Cd138). Cytogenetics +11.       03/01/2017 - 06/29/2017 Chemotherapy    RVD       05/26/2017 Bone Marrow Biopsy    Performed at [facility]:  Plasma cell myeloma in a 30% cellularmarrow with decreased trilineage hematopoiesis and 42% kappalight chain restricted plasma cells on the aspirate smears andlarge aggregates on the core biopsy.       07/12/2017 - 09/01/2017 Chemotherapy    3 cycles of carfizolmib/cyclophosphamide/dexamethasone        10/08/2017 Bone Marrow Transplant    Autotransplant at [facility]       Problem List Patient Active Problem List   Diagnosis Date Noted  . Multiple myeloma not having achieved remission ([place]) [C90.00] 06/29/2017  . Multiple myeloma ([place]) [C90.00] 02/18/2017  . 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 Vasculogenic erectile dysfunction [N52.9] 01/06/2017  . Arthritis [M19.90] 09/10/2016  . Retinopathy [H35.00] 09/10/2016  . Bone metastases ([place]) [C79.51] 09/10/2016  . Use of tamoxifen (Nolvadex) [Z79.810] 10/25/2015  . Breast cancer, male ([place]) [C50.929]   . TIA (transient ischemic attack) [G45.9] 11/12/2013  . GERD [K21.9] 10/07/2010  . CONSTIPATION [K59.00] 10/07/2010  . RECTAL BLEEDING [K62.5] 10/07/2010    Past Medical  History Past Medical History:  Diagnosis Date  . Anxiety   . Bone metastases ([place]) 09/10/2016  . Breast cancer ([place]) 2011   Stave IV breast cancer; radiation and tamoxifen  . Breast cancer, male ([place])    Stave IV breast cancer; radiation and tamoxifen  Overview:  Left breast ca with mets bone  Overview:  METS TO BONE  . GERD (gastroesophageal reflux disease)   . Hypertension   . Macular degeneration   . Multiple myeloma ([place]) 02/18/2017    Past Surgical History Past Surgical History:  Procedure Laterality Date  . BACK SURGERY    . HERNIA REPAIR    . right arm surgery      Family History Family History  Problem Relation Age of Onset  . Stroke Mother   . Cancer Maternal Aunt        cancer NOS; died in her 68s  . Lung cancer Maternal Uncle        smoker     Social History  reports  that  has never smoked. He has quit using smokeless tobacco. He reports that he drinks about 14.4 oz of alcohol per week. He reports that he does not use drugs.  Medications  Current Outpatient Medications:  .  acyclovir (ZOVIRAX) 400 MG tablet, Take 1 tablet (400 mg total) by mouth daily., Disp: 30 tablet, Rfl: 3 .  albuterol (PROVENTIL HFA;VENTOLIN HFA) 108 (90 Base) MCG/ACT inhaler, Inhale 2 puffs into the lungs every 6 (six) hours as needed for wheezing or shortness of breath., Disp: 1 Inhaler, Rfl: 0 .  ALPRAZolam (XANAX) 0.5 MG tablet, Take 1 tablet (0.5 mg total) by mouth at bedtime as needed for anxiety., Disp: 30 tablet, Rfl: 2 .  aspirin EC 81 MG tablet, Take 162 mg by mouth daily. , Disp: , Rfl:  .  calcium-vitamin D (OSCAL WITH D) 500-200 MG-UNIT TABS tablet, TAKE TWO TABLETS BY MOUTH DAILY, Disp: 60 tablet, Rfl: 3 .  Carfilzomib (KYPROLIS IV), Inject into the vein. Day 1 and 2, day 8 and 9, day 15 and 16 every 28 days for 2 months, Disp: , Rfl:  .  cyclophosphamide (CYTOXAN) 2 g chemo injection, Inject into the vein once. Day 1, day 8, day 15, every 28 days for 2 months, Disp: , Rfl:  .   ferrous sulfate 325 (65 FE) MG EC tablet, Take 1 tablet (325 mg total) by mouth 2 (two) times daily after a meal., Disp: 60 tablet, Rfl: 3 .  folic acid (FOLVITE) 1 MG tablet, Take 1 mg by mouth., Disp: , Rfl:  .  gabapentin (NEURONTIN) 300 MG capsule, Take two times a day, then increase to three times a day in a few weeks., Disp: 90 capsule, Rfl: 2 .  loperamide (IMODIUM) 2 MG capsule, Take 1 capsule (2 mg total) by mouth after each loose stool (max '16mg'$  per day)., Disp: , Rfl:  .  metoprolol succinate (TOPROL-XL) 100 MG 24 hr tablet, 50 mg., Disp: , Rfl:  .  Multiple Vitamins-Minerals (OCUVITE PO), Take by mouth daily. , Disp: , Rfl:  .  ondansetron (ZOFRAN) 8 MG tablet, Take 1 tablet (8 mg total) by mouth every 8 (eight) hours as needed for nausea or vomiting., Disp: 30 tablet, Rfl: 2 .  pantoprazole (PROTONIX) 40 MG tablet, Take 40 mg by mouth daily., Disp: , Rfl:  .  polyethylene glycol (MIRALAX / GLYCOLAX) packet, Take 17 g by mouth daily as needed., Disp: , Rfl:  .  prochlorperazine (COMPAZINE) 10 MG tablet, Take 1 tablet (10 mg total) by mouth every 6 (six) hours as needed for nausea or vomiting., Disp: 30 tablet, Rfl: 2 .  Skin Protectants, Misc. (EUCERIN) cream, Apply topically., Disp: , Rfl:  .  sulfamethoxazole-trimethoprim (BACTRIM DS,SEPTRA DS) 800-160 MG tablet, Take 1 tablet by mouth every Monday, Wednesday, and Friday., Disp: , Rfl:  .  tamoxifen (NOLVADEX) 20 MG tablet, TAKE 1 TABLET EVERY DAY, Disp: 90 tablet, Rfl: 1 .  dexamethasone (DECADRON) 4 MG tablet, Take 10 tablets ('40mg'$ ) on days 1,8,15, and 22 of chemo. Repeat every 28 days., Disp: 40 tablet, Rfl: 4  Allergies Patient has no known allergies.  Review of Systems Review of Systems - Oncology ROS as per HPI otherwise 12 point ROS is negative.   Physical Exam  Vitals Wt Readings from Last 3 Encounters:  02/15/18 203 lb 12.8 oz (92.4 kg)  02/14/18 200 lb 3.2 oz (90.8 kg)  02/09/18 196 lb 8 oz (89.1 kg)   Temp  Readings from Last 3  Encounters:  02/15/18 98.2 F (36.8 C) (Oral)  02/14/18 97.9 F (36.6 C) (Oral)  02/09/18 98.3 F (36.8 C) (Oral)   BP Readings from Last 3 Encounters:  02/15/18 129/60  02/14/18 130/72  02/09/18 127/69   Pulse Readings from Last 3 Encounters:  02/15/18 (!) 58  02/14/18 65  02/09/18 63   Constitutional: Well-developed, well-nourished, and in no distress.   HENT: Head: Normocephalic and atraumatic.  Mouth/Throat: No oropharyngeal exudate. Mucosa moist. Eyes: Pupils are equal, round, and reactive to light. Conjunctivae are normal. No scleral icterus.  Neck: Normal range of motion. Neck supple. No JVD present.  Cardiovascular: Normal rate, regular rhythm and normal heart sounds.  Exam reveals no gallop and no friction rub.   No murmur heard. Pulmonary/Chest: Effort normal and breath sounds normal. No respiratory distress. No wheezes.No rales.  Abdominal: Soft. Bowel sounds are normal. No distension. There is no tenderness. There is no guarding.  Musculoskeletal: No edema or tenderness.  Lymphadenopathy: No cervical, axillary or supraclavicular adenopathy.  Neurological: Alert and oriented to person, place, and time. No cranial nerve deficit.  Skin: Skin is warm and dry. No rash noted. No erythema. No pallor.  Psychiatric: Affect and judgment normal.   Labs No visits with results within 3 Day(s) from this visit.  Latest known visit with results is:  Appointment on 01/21/2018  Component Date Value Ref Range Status  . Beta-2 Microglobulin 01/21/2018 2.2  0.6 - 2.4 mg/L Final   Comment: (NOTE) Siemens Immulite 2000 Immunochemiluminometric assay (ICMA) Values obtained with different assay methods or kits cannot be used interchangeably. Results cannot be interpreted as absolute evidence of the presence or absence of malignant disease. Performed At: [facility] [place], [place] [id number] Rush Farmer MD [id number]   . WBC  01/21/2018 5.0  4.0 - 10.5 K/uL Final  . RBC 01/21/2018 3.11* 4.22 - 5.81 MIL/uL Final  . Hemoglobin 01/21/2018 10.4* 13.0 - 17.0 g/dL Final  . HCT 01/21/2018 31.4* 39.0 - 52.0 % Final  . MCV 01/21/2018 101.0* 78.0 - 100.0 fL Final  . MCH 01/21/2018 33.4  26.0 - 34.0 pg Final  . MCHC 01/21/2018 33.1  30.0 - 36.0 g/dL Final  . RDW 01/21/2018 15.2  11.5 - 15.5 % Final  . Platelets 01/21/2018 203  150 - 400 K/uL Final  . Neutrophils Relative % 01/21/2018 62  % Final  . Neutro Abs 01/21/2018 3.1  1.7 - 7.7 K/uL Final  . Lymphocytes Relative 01/21/2018 26  % Final  . Lymphs Abs 01/21/2018 1.3  0.7 - 4.0 K/uL Final  . Monocytes Relative 01/21/2018 7  % Final  . Monocytes Absolute 01/21/2018 0.3  0.1 - 1.0 K/uL Final  . Eosinophils Relative 01/21/2018 5  % Final  . Eosinophils Absolute 01/21/2018 0.2  0.0 - 0.7 K/uL Final  . Basophils Relative 01/21/2018 0  % Final  . Basophils Absolute 01/21/2018 0.0  0.0 - 0.1 K/uL Final  . Sodium 01/21/2018 139  135 - 145 mmol/L Final  . Potassium 01/21/2018 4.3  3.5 - 5.1 mmol/L Final  . Chloride 01/21/2018 109  101 - 111 mmol/L Final  . CO2 01/21/2018 20* 22 - 32 mmol/L Final  . Glucose, Bld 01/21/2018 89  65 - 99 mg/dL Final  . BUN 01/21/2018 14  6 - 20 mg/dL Final  . Creatinine, Ser 01/21/2018 1.28* 0.61 - 1.24 mg/dL Final  . Calcium 01/21/2018 9.1  8.9 - 10.3 mg/dL Final  . Total Protein 01/21/2018 6.3*  6.5 - 8.1 g/dL Final  . Albumin 01/21/2018 3.9  3.5 - 5.0 g/dL Final  . AST 01/21/2018 20  15 - 41 U/L Final  . ALT 01/21/2018 11* 17 - 63 U/L Final  . Alkaline Phosphatase 01/21/2018 25* 38 - 126 U/L Final  . Total Bilirubin 01/21/2018 0.6  0.3 - 1.2 mg/dL Final  . GFR calc non Af Amer 01/21/2018 55* >60 mL/min Final  . GFR calc Af Amer 01/21/2018 >60  >60 mL/min Final   Comment: (NOTE) The eGFR has been calculated using the CKD EPI equation. This calculation has not been validated in all clinical situations. eGFR's persistently <60 mL/min  signify possible Chronic Kidney Disease.   . Anion gap 01/21/2018 10  5 - 15 Final  . Total Protein ELP 01/21/2018 5.9* 6.0 - 8.5 g/dL Final  . IgG (Immunoglobin G), Serum 01/21/2018 623* 700 - 1,600 mg/dL Final  . IgA 01/21/2018 11* 61 - 437 mg/dL Final   Result confirmed on concentration.  . IgM (Immunoglobulin M), Srm 01/21/2018 28  20 - 172 mg/dL Final   Comment: (NOTE) Performed At: [facility] [place], [place] [id number] Rush Farmer MD YJ:[id number]   . Immunofixation Result, Serum 01/21/2018 Comment   Corrected   Comment: (NOTE) Immunofixation shows IgG monoclonal protein with kappa light chain specificity.   . IgG (Immunoglobin G), Serum 01/21/2018 636* 700 - 1,600 mg/dL Final  . IgA 01/21/2018 11* 61 - 437 mg/dL Final   Result confirmed on concentration.  . IgM (Immunoglobulin M), Srm 01/21/2018 28  20 - 172 mg/dL Final   Comment: (NOTE) Performed At: [facility] [place], [place] [id number] Rush Farmer MD YD:[id number]   . Total Protein ELP 01/21/2018 6.1  6.0 - 8.5 g/dL Final  . Albumin ELP 01/21/2018 3.8  2.9 - 4.4 g/dL Final  . Alpha-1-Globulin 01/21/2018 0.3  0.0 - 0.4 g/dL Final  . Alpha-2-Globulin 01/21/2018 0.6  0.4 - 1.0 g/dL Final  . Beta Globulin 01/21/2018 0.8  0.7 - 1.3 g/dL Final  . Gamma Globulin 01/21/2018 0.6  0.4 - 1.8 g/dL Final  . M-Spike, % 01/21/2018 0.3* Not Observed g/dL Final   Comment: An additional m spike was observed at a concentration of 0.1 g/dl   . SPE Interp. 01/21/2018 Comment   Final   Comment: (NOTE) The SPE pattern demonstrates two peaks in the beta-gamma region which may represent monoclonal protein. A biclonal gammopathy may be confirmed by immunofixation, as well as evaluation of the urine for the presence of Bence-Jones protein. Performed At: [facility] [place], [place] [id number] Rush Farmer MD [id number]   . Comment 01/21/2018 Comment    Final   Comment: (NOTE) Protein electrophoresis scan will follow via computer, mail, or courier delivery.   Marland Kitchen GLOBULIN, TOTAL 01/21/2018 2.3  2.2 - 3.9 g/dL Corrected  . A/G Ratio 01/21/2018 1.7  0.7 - 1.7 Corrected  . Kappa free light chain 01/21/2018 15.5  3.3 - 19.4 mg/L Final  . Lamda free light chains 01/21/2018 11.3  5.7 - 26.3 mg/L Final  . Kappa, lamda light chain ratio 01/21/2018 1.37  0.26 - 1.65 Final   Comment: (NOTE) Performed At: [name] [place], [place] [id number] Rush Farmer MD CL:[id number]      Pathology Orders Placed This Encounter  Procedures  . CBC with Differential/Platelet    Standing Status:   Future    Number of Occurrences:   1  Standing Expiration Date:   02/09/2019  . Comprehensive metabolic panel    Standing Status:   Future    Number of Occurrences:   1    Standing Expiration Date:   02/09/2019  . Lactate dehydrogenase    Standing Status:   Future    Number of Occurrences:   1    Standing Expiration Date:   02/09/2019  . Protein electrophoresis, serum    Standing Status:   Future    Number of Occurrences:   1    Standing Expiration Date:   02/09/2019  . Protein Electrophoresis, Urine Rflx.  . IgG, IgA, IgM    Standing Status:   Future    Number of Occurrences:   1    Standing Expiration Date:   02/09/2019       Zoila Shutter MD

## 2018-02-18 ENCOUNTER — Inpatient Hospital Stay (HOSPITAL_COMMUNITY): Payer: Medicare Other

## 2018-02-18 ENCOUNTER — Ambulatory Visit (HOSPITAL_COMMUNITY): Payer: Medicare Other | Admitting: Internal Medicine

## 2018-02-18 ENCOUNTER — Encounter (HOSPITAL_COMMUNITY): Payer: Self-pay

## 2018-02-18 VITALS — BP 145/73 | HR 58 | Temp 98.2°F | Resp 18

## 2018-02-18 DIAGNOSIS — Z5111 Encounter for antineoplastic chemotherapy: Secondary | ICD-10-CM | POA: Diagnosis not present

## 2018-02-18 DIAGNOSIS — C7951 Secondary malignant neoplasm of bone: Secondary | ICD-10-CM | POA: Diagnosis not present

## 2018-02-18 DIAGNOSIS — C50122 Malignant neoplasm of central portion of left male breast: Secondary | ICD-10-CM | POA: Diagnosis not present

## 2018-02-18 DIAGNOSIS — Z170 Estrogen receptor positive status [ER+]: Secondary | ICD-10-CM | POA: Diagnosis not present

## 2018-02-18 DIAGNOSIS — C9000 Multiple myeloma not having achieved remission: Secondary | ICD-10-CM | POA: Diagnosis not present

## 2018-02-18 DIAGNOSIS — I10 Essential (primary) hypertension: Secondary | ICD-10-CM | POA: Diagnosis not present

## 2018-02-18 LAB — COMPREHENSIVE METABOLIC PANEL
ALBUMIN: 3.3 g/dL — AB (ref 3.5–5.0)
ALK PHOS: 22 U/L — AB (ref 38–126)
ALT: 12 U/L — AB (ref 17–63)
ANION GAP: 6 (ref 5–15)
AST: 13 U/L — ABNORMAL LOW (ref 15–41)
BILIRUBIN TOTAL: 0.2 mg/dL — AB (ref 0.3–1.2)
BUN: 25 mg/dL — AB (ref 6–20)
CALCIUM: 9 mg/dL (ref 8.9–10.3)
CO2: 25 mmol/L (ref 22–32)
CREATININE: 1.42 mg/dL — AB (ref 0.61–1.24)
Chloride: 106 mmol/L (ref 101–111)
GFR calc Af Amer: 57 mL/min — ABNORMAL LOW (ref >60)
GFR calc non Af Amer: 49 mL/min — ABNORMAL LOW (ref >60)
GLUCOSE: 91 mg/dL (ref 65–99)
Potassium: 4.5 mmol/L (ref 3.5–5.1)
Sodium: 137 mmol/L (ref 135–145)
TOTAL PROTEIN: 5.4 g/dL — AB (ref 6.5–8.1)

## 2018-02-18 MED ORDER — DENOSUMAB 120 MG/1.7ML ~~LOC~~ SOLN
120.0000 mg | Freq: Once | SUBCUTANEOUS | Status: AC
  Administered 2018-02-18: 120 mg via SUBCUTANEOUS
  Filled 2018-02-18: qty 1.7

## 2018-02-18 NOTE — Progress Notes (Signed)
Nutrition Follow-up:  Patient with stage IV breast cancer with bone mets and multiple myeloma s/p stem cell transplant at [facility]  Patient has started chemotherapy again of kyprolis and cytoxan.    Met with patient and wife today prior to injection.  Patient reports appetite is fairly good following first round of chemotherapy. Patient reports when he was given chemotherapy prior to stem cell transplant tolerated fairly well but had decreased appetite and taste change.  Patient reports he has been eating well including good sources of protein and sweets.  Patient reports that he is drinking 1 sometimes 2 ensure per day.  Patient does not really like a lot of vegetables but trying to eat a well balanced diet.     Medications: reviewed  Labs: reviewed  Anthropometrics:   Weight increased to 203 lb 12.8 oz on 2/19 from 190 lb 9 oz on 1/25.    Reports he lost about 10 lb during last round of chemotherapy.     NUTRITION DIAGNOSIS: Inadequate oral intake improved   INTERVENTION:   Discussed importance of good nutrition during chemotherapy.   Reviewed foods to choose with nausea and vomiting.  Fact sheet given.   Also discussed taste change as anticipated with this chemotherapy regimen.  Fact sheet given as well Patient given 3rd case of ensure plus today.      MONITORING, EVALUATION, GOAL: Patient will consume adequate calories and protein to meet nutritional needs and prevent further weight loss.    NEXT VISIT: March 22   Kahari Critzer B. Zenia Resides, [place], [place] Registered Dietitian [phone] (pager)

## 2018-02-18 NOTE — Patient Instructions (Signed)
[facility]  Discharge Instructions: You received an xgeva shot today.  _______________________________________________________________  Thank you for choosing [facility] at [facility] to provide your oncology and hematology care.  To afford each patient quality time with our providers, please arrive at least 15 minutes before your scheduled appointment.  You need to re-schedule your appointment if you arrive 10 or more minutes late.  We strive to give you quality time with our providers, and arriving late affects you and other patients whose appointments are after yours.  Also, if you no show three or more times for appointments you may be dismissed from the clinic.  Again, thank you for choosing [facility]. Our hope is that these requests will allow you access to exceptional care and in a timely manner. _______________________________________________________________  If you have questions after your visit, please contact our office at [phone] between the hours of 8:30 a.m. and 5:00 p.m. Voicemails left after 4:30 p.m. will not be returned until the following business day. _______________________________________________________________  For prescription refill requests, have your pharmacy contact our office. _______________________________________________________________  Recommendations made by the consultant and any test results will be sent to your referring physician. _______________________________________________________________ 

## 2018-02-18 NOTE — Progress Notes (Signed)
Patient tolerated xgeva shot with no complaints. Taking oystershell calcium tabs x 2 daily and denied tooth, jaw, and leg pain.  Injection site clean and dry with no bruising or swelling noted at site.  Band aid applied.  VSS with discharge and left ambulatory with wife with no s/s of distress noted.

## 2018-02-21 ENCOUNTER — Encounter (HOSPITAL_COMMUNITY): Payer: Self-pay

## 2018-02-21 ENCOUNTER — Inpatient Hospital Stay (HOSPITAL_COMMUNITY): Payer: Medicare Other

## 2018-02-21 VITALS — BP 138/62 | HR 62 | Temp 98.8°F | Resp 18 | Wt 197.4 lb

## 2018-02-21 DIAGNOSIS — C50122 Malignant neoplasm of central portion of left male breast: Secondary | ICD-10-CM | POA: Diagnosis not present

## 2018-02-21 DIAGNOSIS — C7951 Secondary malignant neoplasm of bone: Secondary | ICD-10-CM | POA: Diagnosis not present

## 2018-02-21 DIAGNOSIS — C9000 Multiple myeloma not having achieved remission: Secondary | ICD-10-CM

## 2018-02-21 DIAGNOSIS — Z170 Estrogen receptor positive status [ER+]: Secondary | ICD-10-CM | POA: Diagnosis not present

## 2018-02-21 DIAGNOSIS — I10 Essential (primary) hypertension: Secondary | ICD-10-CM | POA: Diagnosis not present

## 2018-02-21 DIAGNOSIS — Z5111 Encounter for antineoplastic chemotherapy: Secondary | ICD-10-CM | POA: Diagnosis not present

## 2018-02-21 LAB — CBC WITH DIFFERENTIAL/PLATELET
BASOS ABS: 0 10*3/uL (ref 0.0–0.1)
BASOS PCT: 0 %
Eosinophils Absolute: 0.1 10*3/uL (ref 0.0–0.7)
Eosinophils Relative: 2 %
HCT: 30.7 % — ABNORMAL LOW (ref 39.0–52.0)
HEMOGLOBIN: 10.2 g/dL — AB (ref 13.0–17.0)
Lymphocytes Relative: 12 %
Lymphs Abs: 0.7 10*3/uL (ref 0.7–4.0)
MCH: 33.8 pg (ref 26.0–34.0)
MCHC: 33.2 g/dL (ref 30.0–36.0)
MCV: 101.7 fL — ABNORMAL HIGH (ref 78.0–100.0)
MONOS PCT: 2 %
Monocytes Absolute: 0.1 10*3/uL (ref 0.1–1.0)
NEUTROS PCT: 84 %
Neutro Abs: 4.8 10*3/uL (ref 1.7–7.7)
Platelets: 125 10*3/uL — ABNORMAL LOW (ref 150–400)
RBC: 3.02 MIL/uL — ABNORMAL LOW (ref 4.22–5.81)
RDW: 13.4 % (ref 11.5–15.5)
WBC: 5.7 10*3/uL (ref 4.0–10.5)

## 2018-02-21 LAB — COMPREHENSIVE METABOLIC PANEL
ALBUMIN: 3.8 g/dL (ref 3.5–5.0)
ALK PHOS: 26 U/L — AB (ref 38–126)
ALT: 12 U/L — ABNORMAL LOW (ref 17–63)
ANION GAP: 9 (ref 5–15)
AST: 19 U/L (ref 15–41)
BUN: 20 mg/dL (ref 6–20)
CALCIUM: 9.1 mg/dL (ref 8.9–10.3)
CO2: 26 mmol/L (ref 22–32)
Chloride: 105 mmol/L (ref 101–111)
Creatinine, Ser: 1.34 mg/dL — ABNORMAL HIGH (ref 0.61–1.24)
GFR calc Af Amer: >60 mL/min (ref >60)
GFR calc non Af Amer: 52 mL/min — ABNORMAL LOW (ref >60)
GLUCOSE: 106 mg/dL — AB (ref 65–99)
Potassium: 4.5 mmol/L (ref 3.5–5.1)
Sodium: 140 mmol/L (ref 135–145)
Total Bilirubin: 0.4 mg/dL (ref 0.3–1.2)
Total Protein: 6 g/dL — ABNORMAL LOW (ref 6.5–8.1)

## 2018-02-21 LAB — LACTATE DEHYDROGENASE: LDH: 132 U/L (ref 98–192)

## 2018-02-21 MED ORDER — DEXTROSE 5 % IV SOLN
36.0000 mg/m2 | Freq: Once | INTRAVENOUS | Status: AC
  Administered 2018-02-21: 76 mg via INTRAVENOUS
  Filled 2018-02-21: qty 8

## 2018-02-21 MED ORDER — SODIUM CHLORIDE 0.9 % IV SOLN
Freq: Once | INTRAVENOUS | Status: AC
  Administered 2018-02-21: 11:00:00 via INTRAVENOUS

## 2018-02-21 MED ORDER — SODIUM CHLORIDE 0.9 % IV SOLN: 10.0000 mg | Freq: Once | INTRAVENOUS | Status: DC

## 2018-02-21 MED ORDER — DEXAMETHASONE SODIUM PHOSPHATE 10 MG/ML IJ SOLN
10.0000 mg | Freq: Once | INTRAMUSCULAR | Status: AC
  Administered 2018-02-21: 10 mg via INTRAVENOUS
  Filled 2018-02-21: qty 1

## 2018-02-21 MED ORDER — PALONOSETRON HCL INJECTION 0.25 MG/5ML
0.2500 mg | Freq: Once | INTRAVENOUS | Status: AC
  Administered 2018-02-21: 0.25 mg via INTRAVENOUS
  Filled 2018-02-21: qty 5

## 2018-02-21 MED ORDER — SODIUM CHLORIDE 0.9 % IV SOLN
300.0000 mg/m2 | Freq: Once | INTRAVENOUS | Status: AC
  Administered 2018-02-21: 640 mg via INTRAVENOUS
  Filled 2018-02-21: qty 32

## 2018-02-21 NOTE — Progress Notes (Signed)
1110 Labs reviewed with Dr. Walden Field today and pt approved for chemo tx per MD                                       Johnny Lucas tolerated chemo tx well without complaints or incident. Peripheral IV site checked prior to,during and after each medication given with good blood return noted. IV site saline locked and flushed for possible use tomorrow. VSS upon discharge. Pt discharged self ambulatory in satisfactory condition accompanied by his wife

## 2018-02-22 ENCOUNTER — Encounter (HOSPITAL_COMMUNITY): Payer: Self-pay

## 2018-02-22 ENCOUNTER — Inpatient Hospital Stay (HOSPITAL_COMMUNITY): Payer: Medicare Other

## 2018-02-22 VITALS — BP 142/71 | HR 57 | Temp 98.0°F | Resp 18 | Wt 202.8 lb

## 2018-02-22 DIAGNOSIS — C9000 Multiple myeloma not having achieved remission: Secondary | ICD-10-CM

## 2018-02-22 DIAGNOSIS — C7951 Secondary malignant neoplasm of bone: Secondary | ICD-10-CM | POA: Diagnosis not present

## 2018-02-22 DIAGNOSIS — Z170 Estrogen receptor positive status [ER+]: Secondary | ICD-10-CM | POA: Diagnosis not present

## 2018-02-22 DIAGNOSIS — Z5111 Encounter for antineoplastic chemotherapy: Secondary | ICD-10-CM | POA: Diagnosis not present

## 2018-02-22 DIAGNOSIS — C50122 Malignant neoplasm of central portion of left male breast: Secondary | ICD-10-CM | POA: Diagnosis not present

## 2018-02-22 DIAGNOSIS — I10 Essential (primary) hypertension: Secondary | ICD-10-CM | POA: Diagnosis not present

## 2018-02-22 MED ORDER — SODIUM CHLORIDE 0.9 % IV SOLN
Freq: Once | INTRAVENOUS | Status: AC
  Administered 2018-02-22: 11:00:00 via INTRAVENOUS

## 2018-02-22 MED ORDER — SODIUM CHLORIDE 0.9 % IV SOLN
Freq: Once | INTRAVENOUS | Status: AC
  Administered 2018-02-22: 12:00:00 via INTRAVENOUS

## 2018-02-22 MED ORDER — CARFILZOMIB CHEMO INJECTION 60 MG
36.0000 mg/m2 | Freq: Once | INTRAVENOUS | Status: AC
  Administered 2018-02-22: 76 mg via INTRAVENOUS
  Filled 2018-02-22: qty 0

## 2018-02-22 MED ORDER — DEXAMETHASONE SODIUM PHOSPHATE 10 MG/ML IJ SOLN
10.0000 mg | Freq: Once | INTRAMUSCULAR | Status: AC
  Administered 2018-02-22: 10 mg via INTRAVENOUS
  Filled 2018-02-22: qty 1

## 2018-02-22 NOTE — Progress Notes (Signed)
[name] tolerated Kyprolis infusion well without complaints or incident. VSS upon discharge. Pt discharged self ambulatory in satisfactory condition accompanied by his wife

## 2018-02-22 NOTE — Patient Instructions (Signed)
[facility] Discharge Instructions for Patients Receiving Chemotherapy   Beginning January 23rd 2017 lab work for the Cancer Center will be done in the  Main lab at [place] on 1st floor. If you have a lab appointment with the Cancer Center please come in thru the  Main Entrance and check in at the main information desk   Today you received the following chemotherapy agents Kyprolis. Follow-up as scheduled. Call clinic for any questions or concerns  To help prevent nausea and vomiting after your treatment, we encourage you to take your nausea medication   If you develop nausea and vomiting, or diarrhea that is not controlled by your medication, call the clinic.  The clinic phone number is [phone]. Office hours are Monday-Friday 8:30am-5:00pm.  BELOW ARE SYMPTOMS THAT SHOULD BE REPORTED IMMEDIATELY:  *FEVER GREATER THAN 101.0 F  *CHILLS WITH OR WITHOUT FEVER  NAUSEA AND VOMITING THAT IS NOT CONTROLLED WITH YOUR NAUSEA MEDICATION  *UNUSUAL SHORTNESS OF BREATH  *UNUSUAL BRUISING OR BLEEDING  TENDERNESS IN MOUTH AND THROAT WITH OR WITHOUT PRESENCE OF ULCERS  *URINARY PROBLEMS  *BOWEL PROBLEMS  UNUSUAL RASH Items with * indicate a potential emergency and should be followed up as soon as possible. If you have an emergency after office hours please contact your primary care physician or go to the nearest emergency department.  Please call the clinic during office hours if you have any questions or concerns.   You may also contact the Patient Navigator at [phone] should you have any questions or need assistance in obtaining follow up care.      Resources For Cancer Patients and their Caregivers ? American Cancer Society: Can assist with transportation, wigs, general needs, runs Look Good Feel Better.        1-888-227-6333 ? Cancer Care: Provides financial assistance, online support groups, medication/co-pay assistance.  1-800-813-HOPE  (4673) ? [facility] Assists [place] cancer patients and their families through emotional , educational and financial support.  336-427-4357 ? [place] DSS Where to apply for food stamps, Medicaid and utility assistance. 336-342-1394 ? [facility]: Transportation to medical appointments. 336-347-2287 ? Social Security Administration: May apply for disability if have a Stage IV cancer. 336-342-7796 1-800-772-1213 ? [place] Aging, Disability and Transit Services: Assists with nutrition, care and transit needs. 336-349-2343         

## 2018-02-23 MED FILL — Carfilzomib For Inj 60 MG: INTRAVENOUS | Qty: 30 | Status: AC

## 2018-02-23 MED FILL — Carfilzomib For Inj 10 MG: INTRAVENOUS | Qty: 8 | Status: AC

## 2018-02-23 MED FILL — Dextrose Inj 5%: INTRAVENOUS | Qty: 50 | Status: AC

## 2018-02-23 MED FILL — Carfilzomib For Inj 10 MG: INTRAVENOUS | Qty: 2 | Status: AC

## 2018-02-28 ENCOUNTER — Ambulatory Visit (HOSPITAL_COMMUNITY): Payer: Medicare Other

## 2018-02-28 ENCOUNTER — Other Ambulatory Visit: Payer: Self-pay

## 2018-02-28 ENCOUNTER — Inpatient Hospital Stay (HOSPITAL_COMMUNITY): Payer: Medicare Other | Attending: Internal Medicine

## 2018-02-28 ENCOUNTER — Inpatient Hospital Stay (HOSPITAL_COMMUNITY): Payer: Medicare Other

## 2018-02-28 ENCOUNTER — Encounter (HOSPITAL_COMMUNITY): Payer: Self-pay

## 2018-02-28 VITALS — BP 146/82 | HR 62 | Temp 98.2°F | Resp 18 | Wt 196.6 lb

## 2018-02-28 DIAGNOSIS — Z801 Family history of malignant neoplasm of trachea, bronchus and lung: Secondary | ICD-10-CM | POA: Diagnosis not present

## 2018-02-28 DIAGNOSIS — C9000 Multiple myeloma not having achieved remission: Secondary | ICD-10-CM | POA: Insufficient documentation

## 2018-02-28 DIAGNOSIS — I2510 Atherosclerotic heart disease of native coronary artery without angina pectoris: Secondary | ICD-10-CM | POA: Diagnosis not present

## 2018-02-28 DIAGNOSIS — Z5111 Encounter for antineoplastic chemotherapy: Secondary | ICD-10-CM | POA: Diagnosis not present

## 2018-02-28 DIAGNOSIS — Z9481 Bone marrow transplant status: Secondary | ICD-10-CM | POA: Insufficient documentation

## 2018-02-28 DIAGNOSIS — C7951 Secondary malignant neoplasm of bone: Secondary | ICD-10-CM | POA: Diagnosis not present

## 2018-02-28 DIAGNOSIS — Z79899 Other long term (current) drug therapy: Secondary | ICD-10-CM | POA: Insufficient documentation

## 2018-02-28 DIAGNOSIS — C50922 Malignant neoplasm of unspecified site of left male breast: Secondary | ICD-10-CM | POA: Insufficient documentation

## 2018-02-28 DIAGNOSIS — Z809 Family history of malignant neoplasm, unspecified: Secondary | ICD-10-CM | POA: Diagnosis not present

## 2018-02-28 DIAGNOSIS — F419 Anxiety disorder, unspecified: Secondary | ICD-10-CM | POA: Insufficient documentation

## 2018-02-28 DIAGNOSIS — Z923 Personal history of irradiation: Secondary | ICD-10-CM | POA: Insufficient documentation

## 2018-02-28 DIAGNOSIS — Z79811 Long term (current) use of aromatase inhibitors: Secondary | ICD-10-CM | POA: Insufficient documentation

## 2018-02-28 DIAGNOSIS — I10 Essential (primary) hypertension: Secondary | ICD-10-CM | POA: Insufficient documentation

## 2018-02-28 DIAGNOSIS — Z170 Estrogen receptor positive status [ER+]: Secondary | ICD-10-CM | POA: Diagnosis not present

## 2018-02-28 DIAGNOSIS — I700 Atherosclerosis of aorta: Secondary | ICD-10-CM | POA: Insufficient documentation

## 2018-02-28 LAB — CBC WITH DIFFERENTIAL/PLATELET
BASOS ABS: 0 10*3/uL (ref 0.0–0.1)
BASOS PCT: 0 %
Eosinophils Absolute: 0.2 10*3/uL (ref 0.0–0.7)
Eosinophils Relative: 2 %
HCT: 31.6 % — ABNORMAL LOW (ref 39.0–52.0)
Hemoglobin: 10.3 g/dL — ABNORMAL LOW (ref 13.0–17.0)
LYMPHS PCT: 8 %
Lymphs Abs: 0.5 10*3/uL — ABNORMAL LOW (ref 0.7–4.0)
MCH: 33.2 pg (ref 26.0–34.0)
MCHC: 32.6 g/dL (ref 30.0–36.0)
MCV: 101.9 fL — AB (ref 78.0–100.0)
Monocytes Absolute: 0.5 10*3/uL (ref 0.1–1.0)
Monocytes Relative: 7 %
NEUTROS ABS: 5.6 10*3/uL (ref 1.7–7.7)
Neutrophils Relative %: 83 %
PLATELETS: 113 10*3/uL — AB (ref 150–400)
RBC: 3.1 MIL/uL — AB (ref 4.22–5.81)
RDW: 12.6 % (ref 11.5–15.5)
WBC: 6.8 10*3/uL (ref 4.0–10.5)

## 2018-02-28 LAB — COMPREHENSIVE METABOLIC PANEL
ALBUMIN: 3.9 g/dL (ref 3.5–5.0)
ALT: 14 U/L — AB (ref 17–63)
AST: 22 U/L (ref 15–41)
Alkaline Phosphatase: 27 U/L — ABNORMAL LOW (ref 38–126)
Anion gap: 9 (ref 5–15)
BUN: 20 mg/dL (ref 6–20)
CO2: 22 mmol/L (ref 22–32)
CREATININE: 1.37 mg/dL — AB (ref 0.61–1.24)
Calcium: 9 mg/dL (ref 8.9–10.3)
Chloride: 106 mmol/L (ref 101–111)
GFR calc Af Amer: 59 mL/min — ABNORMAL LOW (ref >60)
GFR, EST NON AFRICAN AMERICAN: 51 mL/min — AB (ref >60)
GLUCOSE: 93 mg/dL (ref 65–99)
POTASSIUM: 4.4 mmol/L (ref 3.5–5.1)
Sodium: 137 mmol/L (ref 135–145)
Total Bilirubin: 0.1 mg/dL — ABNORMAL LOW (ref 0.3–1.2)
Total Protein: 6.1 g/dL — ABNORMAL LOW (ref 6.5–8.1)

## 2018-02-28 LAB — LACTATE DEHYDROGENASE: LDH: 140 U/L (ref 98–192)

## 2018-02-28 MED ORDER — SODIUM CHLORIDE 0.9 % IV SOLN
300.0000 mg/m2 | Freq: Once | INTRAVENOUS | Status: AC
  Administered 2018-02-28: 640 mg via INTRAVENOUS
  Filled 2018-02-28: qty 32

## 2018-02-28 MED ORDER — DEXTROSE 5 % IV SOLN
36.0000 mg/m2 | Freq: Once | INTRAVENOUS | Status: AC
  Administered 2018-02-28: 76 mg via INTRAVENOUS
  Filled 2018-02-28: qty 30

## 2018-02-28 MED ORDER — DEXAMETHASONE SODIUM PHOSPHATE 10 MG/ML IJ SOLN
INTRAMUSCULAR | Status: AC
  Filled 2018-02-28: qty 1

## 2018-02-28 MED ORDER — PALONOSETRON HCL INJECTION 0.25 MG/5ML
0.2500 mg | Freq: Once | INTRAVENOUS | Status: AC
  Administered 2018-02-28: 0.25 mg via INTRAVENOUS

## 2018-02-28 MED ORDER — PALONOSETRON HCL INJECTION 0.25 MG/5ML
INTRAVENOUS | Status: AC
  Filled 2018-02-28: qty 5

## 2018-02-28 MED ORDER — SODIUM CHLORIDE 0.9 % IV SOLN
Freq: Once | INTRAVENOUS | Status: AC
  Administered 2018-02-28: 10:00:00 via INTRAVENOUS

## 2018-02-28 MED ORDER — SODIUM CHLORIDE 0.9 % IV SOLN
Freq: Once | INTRAVENOUS | Status: AC
  Administered 2018-02-28: 09:00:00 via INTRAVENOUS

## 2018-02-28 MED ORDER — DEXAMETHASONE SODIUM PHOSPHATE 10 MG/ML IJ SOLN
10.0000 mg | Freq: Once | INTRAMUSCULAR | Status: AC
  Administered 2018-02-28: 10 mg via INTRAVENOUS

## 2018-02-28 NOTE — Progress Notes (Signed)
Labs reviewed by MD. Proceed with treatment today.    Treatment given per orders. Patient tolerated it well without problems. Vitals stable and discharged home from clinic ambulatory. Follow up as scheduled.  

## 2018-02-28 NOTE — Patient Instructions (Signed)
[facility] Discharge Instructions for Patients Receiving Chemotherapy   Beginning January 23rd 2017 lab work for the Cancer Center will be done in the  Main lab at [place] on 1st floor. If you have a lab appointment with the Cancer Center please come in thru the  Main Entrance and check in at the main information desk   Today you received the following chemotherapy agents   To help prevent nausea and vomiting after your treatment, we encourage you to take your nausea medication     If you develop nausea and vomiting, or diarrhea that is not controlled by your medication, call the clinic.  The clinic phone number is [phone]. Office hours are Monday-Friday 8:30am-5:00pm.  BELOW ARE SYMPTOMS THAT SHOULD BE REPORTED IMMEDIATELY:  *FEVER GREATER THAN 101.0 F  *CHILLS WITH OR WITHOUT FEVER  NAUSEA AND VOMITING THAT IS NOT CONTROLLED WITH YOUR NAUSEA MEDICATION  *UNUSUAL SHORTNESS OF BREATH  *UNUSUAL BRUISING OR BLEEDING  TENDERNESS IN MOUTH AND THROAT WITH OR WITHOUT PRESENCE OF ULCERS  *URINARY PROBLEMS  *BOWEL PROBLEMS  UNUSUAL RASH Items with * indicate a potential emergency and should be followed up as soon as possible. If you have an emergency after office hours please contact your primary care physician or go to the nearest emergency department.  Please call the clinic during office hours if you have any questions or concerns.   You may also contact the Patient Navigator at [phone] should you have any questions or need assistance in obtaining follow up care.      Resources For Cancer Patients and their Caregivers ? American Cancer Society: Can assist with transportation, wigs, general needs, runs Look Good Feel Better.        1-888-227-6333 ? Cancer Care: Provides financial assistance, online support groups, medication/co-pay assistance.  1-800-813-HOPE (4673) ? [facility] Assists [place] cancer  patients and their families through emotional , educational and financial support.  336-427-4357 ? [place] DSS Where to apply for food stamps, Medicaid and utility assistance. 336-342-1394 ? [employer]: Transportation to medical appointments. 336-347-2287 ? Social Security Administration: May apply for disability if have a Stage IV cancer. 336-342-7796 1-800-772-1213 ? [place] [facility]ging, Disability and Transit Services: Assists with nutrition, care and transit needs. [phone]         

## 2018-03-01 ENCOUNTER — Inpatient Hospital Stay (HOSPITAL_COMMUNITY): Payer: Medicare Other

## 2018-03-01 ENCOUNTER — Ambulatory Visit (HOSPITAL_COMMUNITY): Payer: Medicare Other

## 2018-03-01 ENCOUNTER — Encounter (HOSPITAL_COMMUNITY): Payer: Self-pay

## 2018-03-01 VITALS — BP 160/81 | HR 85 | Temp 98.5°F | Resp 18

## 2018-03-01 DIAGNOSIS — Z170 Estrogen receptor positive status [ER+]: Secondary | ICD-10-CM | POA: Diagnosis not present

## 2018-03-01 DIAGNOSIS — C50922 Malignant neoplasm of unspecified site of left male breast: Secondary | ICD-10-CM | POA: Diagnosis not present

## 2018-03-01 DIAGNOSIS — Z79899 Other long term (current) drug therapy: Secondary | ICD-10-CM | POA: Diagnosis not present

## 2018-03-01 DIAGNOSIS — C9000 Multiple myeloma not having achieved remission: Secondary | ICD-10-CM | POA: Diagnosis not present

## 2018-03-01 DIAGNOSIS — C7951 Secondary malignant neoplasm of bone: Secondary | ICD-10-CM | POA: Diagnosis not present

## 2018-03-01 DIAGNOSIS — Z5111 Encounter for antineoplastic chemotherapy: Secondary | ICD-10-CM | POA: Diagnosis not present

## 2018-03-01 MED ORDER — SODIUM CHLORIDE 0.9 % IV SOLN
Freq: Once | INTRAVENOUS | Status: AC
  Administered 2018-03-01: 09:00:00 via INTRAVENOUS

## 2018-03-01 MED ORDER — DEXTROSE 5 % IV SOLN
36.0000 mg/m2 | Freq: Once | INTRAVENOUS | Status: AC
  Administered 2018-03-01: 76 mg via INTRAVENOUS
  Filled 2018-03-01: qty 8

## 2018-03-01 MED ORDER — DEXAMETHASONE SODIUM PHOSPHATE 10 MG/ML IJ SOLN
10.0000 mg | Freq: Once | INTRAMUSCULAR | Status: AC
  Administered 2018-03-01: 10 mg via INTRAVENOUS
  Filled 2018-03-01: qty 1

## 2018-03-01 MED ORDER — SODIUM CHLORIDE 0.9% FLUSH: 3.0000 mL | INTRAVENOUS | Status: DC | PRN

## 2018-03-01 NOTE — Progress Notes (Signed)
Johnny Lucas tolerated Kyprolis infusion well without complaints or incident. VSS upon discharge. Pt discharged self ambulatory in satisfactory condition accompanied by his wife

## 2018-03-01 NOTE — Patient Instructions (Signed)
[facility] Discharge Instructions for Patients Receiving Chemotherapy   Beginning January 23rd 2017 lab work for the Cancer Center will be done in the  Main lab at [place] on 1st floor. If you have a lab appointment with the Cancer Center please come in thru the  Main Entrance and check in at the main information desk   Today you received the following chemotherapy agents Kyprolis. Follow-up as scheduled. Call clinic for any questions or concerns  To help prevent nausea and vomiting after your treatment, we encourage you to take your nausea medication   If you develop nausea and vomiting, or diarrhea that is not controlled by your medication, call the clinic.  The clinic phone number is [phone]. Office hours are Monday-Friday 8:30am-5:00pm.  BELOW ARE SYMPTOMS THAT SHOULD BE REPORTED IMMEDIATELY:  *FEVER GREATER THAN 101.0 F  *CHILLS WITH OR WITHOUT FEVER  NAUSEA AND VOMITING THAT IS NOT CONTROLLED WITH YOUR NAUSEA MEDICATION  *UNUSUAL SHORTNESS OF BREATH  *UNUSUAL BRUISING OR BLEEDING  TENDERNESS IN MOUTH AND THROAT WITH OR WITHOUT PRESENCE OF ULCERS  *URINARY PROBLEMS  *BOWEL PROBLEMS  UNUSUAL RASH Items with * indicate a potential emergency and should be followed up as soon as possible. If you have an emergency after office hours please contact your primary care physician or go to the nearest emergency department.  Please call the clinic during office hours if you have any questions or concerns.   You may also contact the Patient Navigator at [phone] should you have any questions or need assistance in obtaining follow up care.      Resources For Cancer Patients and their Caregivers ? American Cancer Society: Can assist with transportation, wigs, general needs, runs Look Good Feel Better.        1-888-227-6333 ? Cancer Care: Provides financial assistance, online support groups, medication/co-pay assistance.  1-800-813-HOPE  (4673) ? [facility] Assists [place] cancer patients and their families through emotional , educational and financial support.  336-427-4357 ? [place] DSS Where to apply for food stamps, Medicaid and utility assistance. 336-342-1394 ? [employer]: Transportation to medical appointments. 336-347-2287 ? Social Security Administration: May apply for disability if have a Stage IV cancer. 336-342-7796 1-800-772-1213 ? [place] [facility]ging, Disability and Transit Services: Assists with nutrition, care and transit needs. [phone]         

## 2018-03-04 ENCOUNTER — Other Ambulatory Visit (HOSPITAL_COMMUNITY): Payer: Self-pay | Admitting: Adult Health

## 2018-03-04 DIAGNOSIS — C9000 Multiple myeloma not having achieved remission: Secondary | ICD-10-CM

## 2018-03-14 ENCOUNTER — Ambulatory Visit (HOSPITAL_COMMUNITY): Payer: Medicare Other

## 2018-03-14 ENCOUNTER — Inpatient Hospital Stay (HOSPITAL_COMMUNITY): Payer: Medicare Other

## 2018-03-14 ENCOUNTER — Other Ambulatory Visit (HOSPITAL_COMMUNITY): Payer: Self-pay | Admitting: Adult Health

## 2018-03-14 ENCOUNTER — Other Ambulatory Visit: Payer: Self-pay

## 2018-03-14 ENCOUNTER — Encounter (HOSPITAL_COMMUNITY): Payer: Self-pay

## 2018-03-14 VITALS — BP 147/66 | HR 60 | Temp 97.6°F | Resp 18 | Wt 195.6 lb

## 2018-03-14 DIAGNOSIS — Z79899 Other long term (current) drug therapy: Secondary | ICD-10-CM | POA: Diagnosis not present

## 2018-03-14 DIAGNOSIS — Z5111 Encounter for antineoplastic chemotherapy: Secondary | ICD-10-CM | POA: Diagnosis not present

## 2018-03-14 DIAGNOSIS — C9000 Multiple myeloma not having achieved remission: Secondary | ICD-10-CM | POA: Diagnosis not present

## 2018-03-14 DIAGNOSIS — C50922 Malignant neoplasm of unspecified site of left male breast: Secondary | ICD-10-CM | POA: Diagnosis not present

## 2018-03-14 DIAGNOSIS — C7951 Secondary malignant neoplasm of bone: Secondary | ICD-10-CM | POA: Diagnosis not present

## 2018-03-14 DIAGNOSIS — Z170 Estrogen receptor positive status [ER+]: Secondary | ICD-10-CM | POA: Diagnosis not present

## 2018-03-14 LAB — CBC WITH DIFFERENTIAL/PLATELET
Basophils Absolute: 0 10*3/uL (ref 0.0–0.1)
Basophils Relative: 0 %
EOS ABS: 0.1 10*3/uL (ref 0.0–0.7)
Eosinophils Relative: 3 %
HCT: 31.7 % — ABNORMAL LOW (ref 39.0–52.0)
HEMOGLOBIN: 10.4 g/dL — AB (ref 13.0–17.0)
LYMPHS ABS: 0.3 10*3/uL — AB (ref 0.7–4.0)
LYMPHS PCT: 6 %
MCH: 33.5 pg (ref 26.0–34.0)
MCHC: 32.8 g/dL (ref 30.0–36.0)
MCV: 102.3 fL — AB (ref 78.0–100.0)
Monocytes Absolute: 0.4 10*3/uL (ref 0.1–1.0)
Monocytes Relative: 9 %
NEUTROS PCT: 82 %
Neutro Abs: 3.9 10*3/uL (ref 1.7–7.7)
Platelets: 206 10*3/uL (ref 150–400)
RBC: 3.1 MIL/uL — ABNORMAL LOW (ref 4.22–5.81)
RDW: 13.4 % (ref 11.5–15.5)
WBC: 4.7 10*3/uL (ref 4.0–10.5)

## 2018-03-14 LAB — COMPREHENSIVE METABOLIC PANEL
ALT: 13 U/L — ABNORMAL LOW (ref 17–63)
ANION GAP: 8 (ref 5–15)
AST: 18 U/L (ref 15–41)
Albumin: 3.6 g/dL (ref 3.5–5.0)
Alkaline Phosphatase: 28 U/L — ABNORMAL LOW (ref 38–126)
BUN: 16 mg/dL (ref 6–20)
CHLORIDE: 107 mmol/L (ref 101–111)
CO2: 24 mmol/L (ref 22–32)
Calcium: 8.7 mg/dL — ABNORMAL LOW (ref 8.9–10.3)
Creatinine, Ser: 1.13 mg/dL (ref 0.61–1.24)
GFR calc non Af Amer: >60 mL/min (ref >60)
Glucose, Bld: 100 mg/dL — ABNORMAL HIGH (ref 65–99)
Potassium: 4 mmol/L (ref 3.5–5.1)
SODIUM: 139 mmol/L (ref 135–145)
Total Bilirubin: 0.3 mg/dL (ref 0.3–1.2)
Total Protein: 5.6 g/dL — ABNORMAL LOW (ref 6.5–8.1)

## 2018-03-14 LAB — LACTATE DEHYDROGENASE: LDH: 124 U/L (ref 98–192)

## 2018-03-14 MED ORDER — CARFILZOMIB CHEMO INJECTION 60 MG
36.0000 mg/m2 | Freq: Once | INTRAVENOUS | Status: AC
  Administered 2018-03-14: 76 mg via INTRAVENOUS
  Filled 2018-03-14: qty 30

## 2018-03-14 MED ORDER — SODIUM CHLORIDE 0.9 % IV SOLN: 10.0000 mg | Freq: Once | INTRAVENOUS | Status: DC

## 2018-03-14 MED ORDER — SODIUM CHLORIDE 0.9 % IV SOLN
300.0000 mg/m2 | Freq: Once | INTRAVENOUS | Status: AC
  Administered 2018-03-14: 640 mg via INTRAVENOUS
  Filled 2018-03-14: qty 32

## 2018-03-14 MED ORDER — PALONOSETRON HCL INJECTION 0.25 MG/5ML
0.2500 mg | Freq: Once | INTRAVENOUS | Status: AC
  Administered 2018-03-14: 0.25 mg via INTRAVENOUS
  Filled 2018-03-14: qty 5

## 2018-03-14 MED ORDER — SODIUM CHLORIDE 0.9 % IV SOLN
Freq: Once | INTRAVENOUS | Status: AC
  Administered 2018-03-14: 10:00:00 via INTRAVENOUS

## 2018-03-14 MED ORDER — DEXAMETHASONE SODIUM PHOSPHATE 10 MG/ML IJ SOLN
10.0000 mg | Freq: Once | INTRAMUSCULAR | Status: AC
  Administered 2018-03-14: 10 mg via INTRAVENOUS
  Filled 2018-03-14: qty 1

## 2018-03-14 NOTE — Patient Instructions (Signed)
[facility] Discharge Instructions for Patients Receiving Chemotherapy   Beginning January 23rd 2017 lab work for the Cancer Center will be done in the  Main lab at [place] on 1st floor. If you have a lab appointment with the Cancer Center please come in thru the  Main Entrance and check in at the main information desk   Today you received the following chemotherapy agents   To help prevent nausea and vomiting after your treatment, we encourage you to take your nausea medication     If you develop nausea and vomiting, or diarrhea that is not controlled by your medication, call the clinic.  The clinic phone number is [phone]. Office hours are Monday-Friday 8:30am-5:00pm.  BELOW ARE SYMPTOMS THAT SHOULD BE REPORTED IMMEDIATELY:  *FEVER GREATER THAN 101.0 F  *CHILLS WITH OR WITHOUT FEVER  NAUSEA AND VOMITING THAT IS NOT CONTROLLED WITH YOUR NAUSEA MEDICATION  *UNUSUAL SHORTNESS OF BREATH  *UNUSUAL BRUISING OR BLEEDING  TENDERNESS IN MOUTH AND THROAT WITH OR WITHOUT PRESENCE OF ULCERS  *URINARY PROBLEMS  *BOWEL PROBLEMS  UNUSUAL RASH Items with * indicate a potential emergency and should be followed up as soon as possible. If you have an emergency after office hours please contact your primary care physician or go to the nearest emergency department.  Please call the clinic during office hours if you have any questions or concerns.   You may also contact the Patient Navigator at [phone] should you have any questions or need assistance in obtaining follow up care.      Resources For Cancer Patients and their Caregivers ? American Cancer Society: Can assist with transportation, wigs, general needs, runs Look Good Feel Better.        1-888-227-6333 ? Cancer Care: Provides financial assistance, online support groups, medication/co-pay assistance.  1-800-813-HOPE (4673) ? [facility] Assists [place] cancer  patients and their families through emotional , educational and financial support.  336-427-4357 ? [place] DSS Where to apply for food stamps, Medicaid and utility assistance. 336-342-1394 ? [employer]: Transportation to medical appointments. 336-347-2287 ? Social Security Administration: May apply for disability if have a Stage IV cancer. [phone] 1-800-772-1213 ? [place] [facility]ging, Disability and Transit Services: Assists with nutrition, care and transit needs. [phone]         

## 2018-03-14 NOTE — Progress Notes (Signed)
Patient is taking tamoxifen and has not missed any doses and reports no side effects at this time.    Labs reviewed by MD today. Proceed with treatment.  Treatment given per orders. Patient tolerated it well without problems. Vitals stable and discharged home from clinic ambulatory. Follow up as scheduled.

## 2018-03-15 ENCOUNTER — Ambulatory Visit (HOSPITAL_COMMUNITY): Payer: Medicare Other

## 2018-03-15 ENCOUNTER — Inpatient Hospital Stay (HOSPITAL_COMMUNITY): Payer: Medicare Other

## 2018-03-15 VITALS — BP 135/67 | HR 61 | Temp 97.4°F | Resp 18

## 2018-03-15 DIAGNOSIS — Z5111 Encounter for antineoplastic chemotherapy: Secondary | ICD-10-CM | POA: Diagnosis not present

## 2018-03-15 DIAGNOSIS — C9000 Multiple myeloma not having achieved remission: Secondary | ICD-10-CM

## 2018-03-15 DIAGNOSIS — Z170 Estrogen receptor positive status [ER+]: Secondary | ICD-10-CM | POA: Diagnosis not present

## 2018-03-15 DIAGNOSIS — C7951 Secondary malignant neoplasm of bone: Secondary | ICD-10-CM | POA: Diagnosis not present

## 2018-03-15 DIAGNOSIS — Z79899 Other long term (current) drug therapy: Secondary | ICD-10-CM | POA: Diagnosis not present

## 2018-03-15 DIAGNOSIS — C50922 Malignant neoplasm of unspecified site of left male breast: Secondary | ICD-10-CM | POA: Diagnosis not present

## 2018-03-15 LAB — PROTEIN ELECTROPHORESIS, SERUM
A/G RATIO SPE: 1.7 (ref 0.7–1.7)
ALPHA-2-GLOBULIN: 0.5 g/dL (ref 0.4–1.0)
Albumin ELP: 3.4 g/dL (ref 2.9–4.4)
Alpha-1-Globulin: 0.2 g/dL (ref 0.0–0.4)
Beta Globulin: 0.8 g/dL (ref 0.7–1.3)
Gamma Globulin: 0.4 g/dL (ref 0.4–1.8)
Globulin, Total: 2 g/dL — ABNORMAL LOW (ref 2.2–3.9)
M-Spike, %: 0.2 g/dL — ABNORMAL HIGH
TOTAL PROTEIN ELP: 5.4 g/dL — AB (ref 6.0–8.5)

## 2018-03-15 LAB — IGG, IGA, IGM
IgG (Immunoglobin G), Serum: 443 mg/dL — ABNORMAL LOW (ref 700–1600)
IgM (Immunoglobulin M), Srm: 14 mg/dL — ABNORMAL LOW (ref 20–172)

## 2018-03-15 LAB — KAPPA/LAMBDA LIGHT CHAINS
Kappa free light chain: 12.7 mg/L (ref 3.3–19.4)
Kappa, lambda light chain ratio: 6.68 — ABNORMAL HIGH (ref 0.26–1.65)
LAMDA FREE LIGHT CHAINS: 1.9 mg/L — AB (ref 5.7–26.3)

## 2018-03-15 LAB — BETA 2 MICROGLOBULIN, SERUM: Beta-2 Microglobulin: 1.9 mg/L (ref 0.6–2.4)

## 2018-03-15 MED ORDER — DEXAMETHASONE SODIUM PHOSPHATE 10 MG/ML IJ SOLN
10.0000 mg | Freq: Once | INTRAMUSCULAR | Status: AC
  Administered 2018-03-15: 10 mg via INTRAVENOUS
  Filled 2018-03-15: qty 1

## 2018-03-15 MED ORDER — SODIUM CHLORIDE 0.9 % IV SOLN
Freq: Once | INTRAVENOUS | Status: AC
  Administered 2018-03-15: 09:00:00 via INTRAVENOUS

## 2018-03-15 MED ORDER — DEXTROSE 5 % IV SOLN
36.0000 mg/m2 | Freq: Once | INTRAVENOUS | Status: AC
  Administered 2018-03-15: 76 mg via INTRAVENOUS
  Filled 2018-03-15: qty 30

## 2018-03-15 MED ORDER — SODIUM CHLORIDE 0.9 % IV SOLN: 10.0000 mg | Freq: Once | INTRAVENOUS | Status: DC

## 2018-03-15 NOTE — Patient Instructions (Signed)
[facility] Discharge Instructions for Patients Receiving Chemotherapy   Beginning January 23rd 2017 lab work for the Cancer Center will be done in the  Main lab at [place] on 1st floor. If you have a lab appointment with the Cancer Center please come in thru the  Main Entrance and check in at the main information desk   Today you received the following chemotherapy agents   To help prevent nausea and vomiting after your treatment, we encourage you to take your nausea medication     If you develop nausea and vomiting, or diarrhea that is not controlled by your medication, call the clinic.  The clinic phone number is [phone]. Office hours are Monday-Friday 8:30am-5:00pm.  BELOW ARE SYMPTOMS THAT SHOULD BE REPORTED IMMEDIATELY:  *FEVER GREATER THAN 101.0 F  *CHILLS WITH OR WITHOUT FEVER  NAUSEA AND VOMITING THAT IS NOT CONTROLLED WITH YOUR NAUSEA MEDICATION  *UNUSUAL SHORTNESS OF BREATH  *UNUSUAL BRUISING OR BLEEDING  TENDERNESS IN MOUTH AND THROAT WITH OR WITHOUT PRESENCE OF ULCERS  *URINARY PROBLEMS  *BOWEL PROBLEMS  UNUSUAL RASH Items with * indicate a potential emergency and should be followed up as soon as possible. If you have an emergency after office hours please contact your primary care physician or go to the nearest emergency department.  Please call the clinic during office hours if you have any questions or concerns.   You may also contact the Patient Navigator at [phone] should you have any questions or need assistance in obtaining follow up care.      Resources For Cancer Patients and their Caregivers ? American Cancer Society: Can assist with transportation, wigs, general needs, runs Look Good Feel Better.        1-888-227-6333 ? Cancer Care: Provides financial assistance, online support groups, medication/co-pay assistance.  1-800-813-HOPE (4673) ? [facility] Assists [place] cancer  patients and their families through emotional , educational and financial support.  336-427-4357 ? [place] DSS Where to apply for food stamps, Medicaid and utility assistance. 336-342-1394 ? [employer]: Transportation to medical appointments. 336-347-2287 ? Social Security Administration: May apply for disability if have a Stage IV cancer. 336-342-7796 1-800-772-1213 ? [place] [facility]ging, Disability and Transit Services: Assists with nutrition, care and transit needs. [phone]         

## 2018-03-15 NOTE — Progress Notes (Signed)
Treatment given per orders. Patient tolerated it well without problems. Vitals stable and discharged home from clinic ambulatory. Follow up as scheduled.  

## 2018-03-16 LAB — IMMUNOFIXATION ELECTROPHORESIS
IGM (IMMUNOGLOBULIN M), SRM: 14 mg/dL — AB (ref 20–172)
IgG (Immunoglobin G), Serum: 443 mg/dL — ABNORMAL LOW (ref 700–1600)
Total Protein ELP: 5.4 g/dL — ABNORMAL LOW (ref 6.0–8.5)

## 2018-03-18 ENCOUNTER — Inpatient Hospital Stay (HOSPITAL_COMMUNITY): Payer: Medicare Other

## 2018-03-18 ENCOUNTER — Encounter (HOSPITAL_COMMUNITY): Payer: Self-pay

## 2018-03-18 ENCOUNTER — Other Ambulatory Visit (HOSPITAL_COMMUNITY): Payer: Medicare Other

## 2018-03-18 VITALS — BP 144/74 | HR 70 | Temp 98.2°F | Resp 18

## 2018-03-18 DIAGNOSIS — C7951 Secondary malignant neoplasm of bone: Secondary | ICD-10-CM

## 2018-03-18 DIAGNOSIS — Z170 Estrogen receptor positive status [ER+]: Secondary | ICD-10-CM | POA: Diagnosis not present

## 2018-03-18 DIAGNOSIS — C50922 Malignant neoplasm of unspecified site of left male breast: Secondary | ICD-10-CM | POA: Diagnosis not present

## 2018-03-18 DIAGNOSIS — Z79899 Other long term (current) drug therapy: Secondary | ICD-10-CM | POA: Diagnosis not present

## 2018-03-18 DIAGNOSIS — C9000 Multiple myeloma not having achieved remission: Secondary | ICD-10-CM

## 2018-03-18 DIAGNOSIS — Z5111 Encounter for antineoplastic chemotherapy: Secondary | ICD-10-CM | POA: Diagnosis not present

## 2018-03-18 MED ORDER — DENOSUMAB 120 MG/1.7ML ~~LOC~~ SOLN
120.0000 mg | Freq: Once | SUBCUTANEOUS | Status: AC
  Administered 2018-03-18: 120 mg via SUBCUTANEOUS
  Filled 2018-03-18: qty 1.7

## 2018-03-18 NOTE — Progress Notes (Signed)
Johnny Lucas tolerated Xgeva injection well without complaints or incident. Labs reviewed with pharmacist prior to administering this medication and pt denied any tooth or jaw pain and no recent or future dental visits. VSS Pt discharged self ambulatory in satisfactory condition accompanied by his wife

## 2018-03-18 NOTE — Patient Instructions (Signed)
[facility] Discharge Instructions  Received Xgeva injection today. Follow-up as scheduled. Call clinic for any questions or concerns   Thank you for choosing [facility] at [facility] to provide your oncology and hematology care.  To afford each patient quality time with our provider, please arrive at least 15 minutes before your scheduled appointment time.   If you have a lab appointment with the Cancer Center please come in thru the  Main Entrance and check in at the main information desk  You need to re-schedule your appointment should you arrive 10 or more minutes late.  We strive to give you quality time with our providers, and arriving late affects you and other patients whose appointments are after yours.  Also, if you no show three or more times for appointments you may be dismissed from the clinic at the providers discretion.     Again, thank you for choosing [facility].  Our hope is that these requests will decrease the amount of time that you wait before being seen by our physicians.       _____________________________________________________________  Should you have questions after your visit to [facility], please contact our office at [phone] between the hours of 8:30 a.m. and 4:30 p.m.  Voicemails left after 4:30 p.m. will not be returned until the following business day.  For prescription refill requests, have your pharmacy contact our office.       Resources For Cancer Patients and their Caregivers ? American Cancer Society: Can assist with transportation, wigs, general needs, runs Look Good Feel Better.        1-888-227-6333 ? Cancer Care: Provides financial assistance, online support groups, medication/co-pay assistance.  1-800-813-HOPE (4673) ? [facility] Assists [place] cancer patients and their families through emotional , educational and  financial support.  336-427-4357 ? [place] DSS Where to apply for food stamps, Medicaid and utility assistance. 336-342-1394 ? [employer]: Transportation to medical appointments. 336-347-2287 ? Social Security Administration: May apply for disability if have a Stage IV cancer. 336-342-7796 1-800-772-1213 ? [place] [facility]ging, Disability and Transit Services: Assists with nutrition, care and transit needs. [phone]  Cancer Center Support Programs:   > Cancer Support Group  2nd Tuesday of the month 1pm-2pm, Journey Room   > Creative Journey  3rd Tuesday of the month 1130am-1pm, Journey Room    

## 2018-03-18 NOTE — Progress Notes (Signed)
Nutrition Follow-up:  Patient with stage IV breast cancer with bone mets and multiple myeloma s/p stem cell transplant at [facility].  Patient currently receiving chemotherapy.  Met with patient and wife prior to receiving xgeva.  Patient reports appetite has decreased a little bit but better than in September when getting chemotherapy.  Patient reports that he continues to drink ensure daily typically in the morning.  Eating as much as he can.  Still loves his sweets.    Reports mild nausea at times but not enough to take medications  Reports taste alterations.   Medications: reviewed  Labs: reviewed  Anthropometrics:   Weight decreased to 195 lb 9.6 oz on 3/18 from 203 lb 12.8 oz on 2/19.      NUTRITION DIAGNOSIS: Inadequate oral intake stable   INTERVENTION:   Reviewed importance of good nutrition during remainder of treatment period.   Encouraged good sources of protein eaten first at meal times.   Discussed weight maintenance during treatment period. Encouraged patient to continue to utilize oral nutrition supplements for added nutrition.    MONITORING, EVALUATION, GOAL: Patient will consume adequate calories and protein to meet nutritional needs and prevent further weight loss.   NEXT VISIT: as needed.  Patient to contact me with questions or concerns  Danah Reinecke B. Zenia Resides, [place], [place] Registered Dietitian [phone] (pager)

## 2018-03-21 ENCOUNTER — Inpatient Hospital Stay (HOSPITAL_BASED_OUTPATIENT_CLINIC_OR_DEPARTMENT_OTHER): Payer: Medicare Other | Admitting: Hematology

## 2018-03-21 ENCOUNTER — Inpatient Hospital Stay (HOSPITAL_COMMUNITY): Payer: Medicare Other

## 2018-03-21 ENCOUNTER — Other Ambulatory Visit: Payer: Self-pay

## 2018-03-21 ENCOUNTER — Encounter (HOSPITAL_COMMUNITY): Payer: Self-pay | Admitting: Hematology

## 2018-03-21 VITALS — BP 120/65 | HR 69 | Temp 97.9°F | Resp 18

## 2018-03-21 DIAGNOSIS — Z9481 Bone marrow transplant status: Secondary | ICD-10-CM

## 2018-03-21 DIAGNOSIS — C9000 Multiple myeloma not having achieved remission: Secondary | ICD-10-CM

## 2018-03-21 DIAGNOSIS — Z809 Family history of malignant neoplasm, unspecified: Secondary | ICD-10-CM

## 2018-03-21 DIAGNOSIS — C7951 Secondary malignant neoplasm of bone: Secondary | ICD-10-CM

## 2018-03-21 DIAGNOSIS — F419 Anxiety disorder, unspecified: Secondary | ICD-10-CM

## 2018-03-21 DIAGNOSIS — Z170 Estrogen receptor positive status [ER+]: Secondary | ICD-10-CM

## 2018-03-21 DIAGNOSIS — Z801 Family history of malignant neoplasm of trachea, bronchus and lung: Secondary | ICD-10-CM

## 2018-03-21 DIAGNOSIS — I10 Essential (primary) hypertension: Secondary | ICD-10-CM | POA: Diagnosis not present

## 2018-03-21 DIAGNOSIS — Z5111 Encounter for antineoplastic chemotherapy: Secondary | ICD-10-CM | POA: Diagnosis not present

## 2018-03-21 DIAGNOSIS — Z79899 Other long term (current) drug therapy: Secondary | ICD-10-CM | POA: Diagnosis not present

## 2018-03-21 DIAGNOSIS — I2510 Atherosclerotic heart disease of native coronary artery without angina pectoris: Secondary | ICD-10-CM

## 2018-03-21 DIAGNOSIS — I700 Atherosclerosis of aorta: Secondary | ICD-10-CM

## 2018-03-21 DIAGNOSIS — C50922 Malignant neoplasm of unspecified site of left male breast: Secondary | ICD-10-CM

## 2018-03-21 DIAGNOSIS — Z79811 Long term (current) use of aromatase inhibitors: Secondary | ICD-10-CM | POA: Diagnosis not present

## 2018-03-21 DIAGNOSIS — Z923 Personal history of irradiation: Secondary | ICD-10-CM

## 2018-03-21 LAB — CBC WITH DIFFERENTIAL/PLATELET
Basophils Absolute: 0 10*3/uL (ref 0.0–0.1)
Basophils Relative: 0 %
EOS PCT: 4 %
Eosinophils Absolute: 0.2 10*3/uL (ref 0.0–0.7)
HCT: 31.2 % — ABNORMAL LOW (ref 39.0–52.0)
Hemoglobin: 10.3 g/dL — ABNORMAL LOW (ref 13.0–17.0)
LYMPHS ABS: 0.3 10*3/uL — AB (ref 0.7–4.0)
LYMPHS PCT: 7 %
MCH: 33.4 pg (ref 26.0–34.0)
MCHC: 33 g/dL (ref 30.0–36.0)
MCV: 101.3 fL — AB (ref 78.0–100.0)
MONOS PCT: 11 %
Monocytes Absolute: 0.5 10*3/uL (ref 0.1–1.0)
Neutro Abs: 3.4 10*3/uL (ref 1.7–7.7)
Neutrophils Relative %: 78 %
PLATELETS: 82 10*3/uL — AB (ref 150–400)
RBC: 3.08 MIL/uL — AB (ref 4.22–5.81)
RDW: 13.1 % (ref 11.5–15.5)
WBC: 4.4 10*3/uL (ref 4.0–10.5)

## 2018-03-21 LAB — COMPREHENSIVE METABOLIC PANEL
ALT: 17 U/L (ref 17–63)
AST: 21 U/L (ref 15–41)
Albumin: 3.5 g/dL (ref 3.5–5.0)
Alkaline Phosphatase: 32 U/L — ABNORMAL LOW (ref 38–126)
Anion gap: 8 (ref 5–15)
BUN: 19 mg/dL (ref 6–20)
CALCIUM: 8.3 mg/dL — AB (ref 8.9–10.3)
CHLORIDE: 106 mmol/L (ref 101–111)
CO2: 23 mmol/L (ref 22–32)
CREATININE: 1.28 mg/dL — AB (ref 0.61–1.24)
GFR, EST NON AFRICAN AMERICAN: 55 mL/min — AB (ref >60)
Glucose, Bld: 107 mg/dL — ABNORMAL HIGH (ref 65–99)
Potassium: 4.1 mmol/L (ref 3.5–5.1)
Sodium: 137 mmol/L (ref 135–145)
Total Bilirubin: 0.6 mg/dL (ref 0.3–1.2)
Total Protein: 5.8 g/dL — ABNORMAL LOW (ref 6.5–8.1)

## 2018-03-21 LAB — LACTATE DEHYDROGENASE: LDH: 130 U/L (ref 98–192)

## 2018-03-21 MED ORDER — DEXAMETHASONE SODIUM PHOSPHATE 10 MG/ML IJ SOLN
10.0000 mg | Freq: Once | INTRAMUSCULAR | Status: AC
  Administered 2018-03-21: 10 mg via INTRAVENOUS
  Filled 2018-03-21: qty 1

## 2018-03-21 MED ORDER — SODIUM CHLORIDE 0.9 % IV SOLN
300.0000 mg/m2 | Freq: Once | INTRAVENOUS | Status: AC
  Administered 2018-03-21: 640 mg via INTRAVENOUS
  Filled 2018-03-21: qty 32

## 2018-03-21 MED ORDER — SODIUM CHLORIDE 0.9 % IV SOLN
Freq: Once | INTRAVENOUS | Status: AC
  Administered 2018-03-21: 12:00:00 via INTRAVENOUS

## 2018-03-21 MED ORDER — PALONOSETRON HCL INJECTION 0.25 MG/5ML
0.2500 mg | Freq: Once | INTRAVENOUS | Status: AC
  Administered 2018-03-21: 0.25 mg via INTRAVENOUS
  Filled 2018-03-21: qty 5

## 2018-03-21 MED ORDER — DEXTROSE 5 % IV SOLN
36.0000 mg/m2 | Freq: Once | INTRAVENOUS | Status: AC
  Administered 2018-03-21: 76 mg via INTRAVENOUS
  Filled 2018-03-21: qty 30

## 2018-03-21 MED ORDER — SODIUM CHLORIDE 0.9 % IV SOLN: 10.0000 mg | Freq: Once | INTRAVENOUS | Status: DC

## 2018-03-21 NOTE — Patient Instructions (Addendum)
[facility] Discharge Instructions for Patients Receiving Chemotherapy   Beginning January 23rd 2017 lab work for the [facility] will be done in the  Main lab at [facility] on 1st floor. If you have a lab appointment with the [place] please come in thru the  Main Entrance and check in at the main information desk   Today you received the following chemotherapy agents Kyprolis and Cytoxan. Follow-up as scheduled. Call clinic for any questions or concerns  To help prevent nausea and vomiting after your treatment, we encourage you to take your nausea medication   If you develop nausea and vomiting, or diarrhea that is not controlled by your medication, call the clinic.  The clinic phone number is [phone]. Office hours are Monday-Friday 8:30am-5:00pm.  BELOW ARE SYMPTOMS THAT SHOULD BE REPORTED IMMEDIATELY:  *FEVER GREATER THAN 101.0 F  *CHILLS WITH OR WITHOUT FEVER  NAUSEA AND VOMITING THAT IS NOT CONTROLLED WITH YOUR NAUSEA MEDICATION  *UNUSUAL SHORTNESS OF BREATH  *UNUSUAL BRUISING OR BLEEDING  TENDERNESS IN MOUTH AND THROAT WITH OR WITHOUT PRESENCE OF ULCERS  *URINARY PROBLEMS  *BOWEL PROBLEMS  UNUSUAL RASH Items with * indicate a potential emergency and should be followed up as soon as possible. If you have an emergency after office hours please contact your primary care physician or go to the nearest emergency department.  Please call the clinic during office hours if you have any questions or concerns.   You may also contact the Patient Navigator at [phone] should you have any questions or need assistance in obtaining follow up care.      Resources For Cancer Patients and their Caregivers ? American Cancer Society: Can assist with transportation, wigs, general needs, runs Look Good Feel Better.        410-234-9864 ? Cancer Care: Provides financial assistance, online support groups, medication/co-pay assistance.   1-800-813-HOPE (386) 285-1616) ? [facility] Assists [place] cancer patients and their families through emotional , educational and financial support.  808-297-3287 ? [place] DSS Where to apply for food stamps, Medicaid and utility assistance. 863-015-5075 ? [employer]: Transportation to medical appointments. (775)720-7826 ? Social Security Administration: May apply for disability if have a Stage IV cancer. [phone])264-9790 737-681-2315 ? [employer], Disability and Transit Services: Assists with nutrition, care and transit needs. ([phone]

## 2018-03-21 NOTE — Progress Notes (Signed)
[place], [place] [address]   CLINIC:  Medical Oncology/Hematology  PCP:  [name], MD [place] [address] [phone]   REASON FOR VISIT:  Follow-up for Multiple myeloma treatment  CURRENT THERAPY: Cycle 2, Day 8 of CCyD  BRIEF ONCOLOGIC HISTORY:    Breast cancer, male ([place])   12/31/2009 Initial Biopsy    Biopsy of L breast       12/31/2009 Pathology Results    Invasive ductal carcinoma, ER/PR+, HER 2 negative      12/31/2009 Imaging    Ultrasound showing a 2.43 x 1.85 x 3 cm hypoechoic spiculated mass in the 12 o clock L breast retroareolar region      01/01/2010 -  Anti-estrogen oral therapy    Tamoxifen 20 mg daily      01/06/2010 Imaging    Bone scan abnormal uptake in the diaphysis of the R humerus, abnormal in the R third, fifth and sixth ribs, lesion also noted in the sternum.      02/03/2010 Surgery    Rod placement and fixation of R humerus by Dr. [name]      02/05/2010 - 02/18/2010 Radiation Therapy    30Gy in 10 fractions of 3 Gy per fraction to R pathologic fracture      03/11/2010 -  Chemotherapy    Denosumab monthly, now every 3 months. Started at [facility]       06/09/2016 Imaging    Three hypermetabolic osseous lesions in the sternum, left ilium and right ilium, as discussed above, likely represent osseous metastases. At this time, these are not recognizable on the CT images. 2. No extra skeletal metastatic disease identified in the neck, chest, abdomen or pelvis.      10/13/2016 Progression    PET shows various new and enlarging osseous metastatic lesions with no definite extra osseous metastatic disease currently identified.       12/31/2016 Progression    1. Multifocal hypermetabolic osseous metastases throughout the axial and proximal appendicular skeleton, which are increased in size, number and metabolism since 10/13/2016 PET-CT. 2. 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 3. No additional sites of hypermetabolic metastatic disease. 4. Chronic right mastoid sinusitis. 5. Aortic atherosclerosis.  One vessel coronary atherosclerosis.       Multiple myeloma ([place])   02/12/2017 Bone Marrow Biopsy    The marrow was variably cellular with large peritrabecular aggregates of kappa restricted plasma cells (66% by aspirate, 30% by Cd138). Cytogenetics +11.       03/01/2017 - 06/29/2017 Chemotherapy    RVD       05/26/2017 Bone Marrow Biopsy    Performed at [facility] [address]:  Plasma cell myeloma in a 30% cellularmarrow with decreased trilineage hematopoiesis and 42% kappalight chain restricted plasma cells on the aspirate smears andlarge aggregates on the core biopsy.       07/12/2017 - 09/01/2017 Chemotherapy    3 cycles of carfizolmib/cyclophosphamide/dexamethasone        10/08/2017 Bone Marrow Transplant    Autotransplant at [place]: Cancer Staging Multiple myeloma [facility]) Staging form: Plasma Cell Myeloma and Plasma Cell Disorders, AJCC 8th Edition - Clinical stage from 05/03/2017: Beta-2-microglobulin (mg/L): 3.5, Albumin (g/dL): 3.4, ISS: Stage II, High-risk cytogenetics: Absent, LDH: Not assessed - Signed by B[facility], PA-C  on 05/03/2017    INTERVAL HISTORY:  Mr. Ladnier 72 y.o. male returns for routine follow-up and consideration for next cycle of chemotherapy.  He is due for day 8 of carfilzomib and cyclophosphamide.  He is tolerating it very well.  He denies any fevers or infections.  He is continuing acyclovir 400 mg twice daily.  He also takes Bactrim 3 times a week.  His tingling and numbness in the toes has been stable.  Denies any new onset pains.  Denies any recent hospitalizations.    REVIEW OF SYSTEMS:  Review of Systems  Constitutional: Negative.   HENT:  Negative.   Eyes:  Negative.   Respiratory: Negative.   Cardiovascular: Negative.   Gastrointestinal: Negative.   Genitourinary: Negative.    Musculoskeletal: Negative.   Skin: Negative.   Neurological: Positive for numbness.  Psychiatric/Behavioral: Negative.      PAST MEDICAL/SURGICAL HISTORY:  Past Medical History:  Diagnosis Date  . Anxiety   . Bone metastases ([place]) 09/10/2016  . Breast cancer ([place]) 2011   Stave IV breast cancer; radiation and tamoxifen  . Breast cancer, male ([place])    Stave IV breast cancer; radiation and tamoxifen  Overview:  Left breast ca with mets bone  Overview:  METS TO BONE  . GERD (gastroesophageal reflux disease)   . Hypertension   . Macular degeneration   . Multiple myeloma ([place]) 02/18/2017   Past Surgical History:  Procedure Laterality Date  . BACK SURGERY    . HERNIA REPAIR    . right arm surgery       SOCIAL HISTORY:  Social History   Socioeconomic History  . Marital status: Married    Spouse name: Not on file  . Number of children: 3  . Years of education: Not on file  . Highest education level: Not on file  Occupational History  . Not on file  Social Needs  . Financial resource strain: Not on file  . Food insecurity:    Worry: Not on file    Inability: Not on file  . Transportation needs:    Medical: Not on file    Non-medical: Not on file  Tobacco Use  . Smoking status: Never Smoker  . Smokeless tobacco: Former Network engineer and Sexual Activity  . Alcohol use: Yes    Alcohol/week: 14.4 oz    Types: 24 Cans of beer per week  . Drug use: No  . Sexual activity: Not on file  Lifestyle  . Physical activity:    Days per week: Not on file    Minutes per session: Not on file  . Stress: Not on file  Relationships  . Social connections:    Talks on phone: Not on file    Gets together: Not on file    Attends religious service: Not on file    Active member of club or organization: Not on file    Attends meetings of clubs or organizations:  Not on file    Relationship status: Not on file  . Intimate partner violence:    Fear of current or ex partner: Not on file    Emotionally abused: Not on file    Physically abused: Not on file    Forced sexual activity: Not on file  Other Topics Concern  . Not on file  Social History Narrative  . Not on file    FAMILY HISTORY:  Family History  Problem Relation Age of Onset  . Stroke Mother   . Cancer  Maternal Aunt        cancer NOS; died in her 57s  . Lung cancer Maternal Uncle        smoker    CURRENT MEDICATIONS:  Outpatient Encounter Medications as of 03/21/2018  Medication Sig Note  . acyclovir (ZOVIRAX) 400 MG tablet Take 1 tablet (400 mg total) by mouth daily.   Marland Kitchen albuterol (PROVENTIL HFA;VENTOLIN HFA) 108 (90 Base) MCG/ACT inhaler Inhale 2 puffs into the lungs every 6 (six) hours as needed for wheezing or shortness of breath.   . ALPRAZolam (XANAX) 0.5 MG tablet TAKE ONE TABLET BY MOUTH AT BEDTIME AS NEEDED FOR ANXIETY   . aspirin EC 81 MG tablet Take 162 mg by mouth daily.  09/10/2016: Received from: [place]: Take 162 mg by mouth daily.  . calcium-vitamin D (OSCAL WITH D) 500-200 MG-UNIT TABS tablet TAKE TWO TABLETS BY MOUTH DAILY 02/18/2018: Terrilee Files   . Carfilzomib (KYPROLIS IV) Inject into the vein. Day 1 and 2, day 8 and 9, day 15 and 16 every 28 days for 2 months   . cyclophosphamide (CYTOXAN) 2 g chemo injection Inject into the vein once. Day 1, day 8, day 15, every 28 days for 2 months   . dexamethasone (DECADRON) 4 MG tablet Take 10 tablets (30m) on days 1,8,15, and 22 of chemo. Repeat every 28 days.   . ferrous sulfate 325 (65 FE) MG EC tablet Take 1 tablet (325 mg total) by mouth 2 (two) times daily after a meal.   . folic acid (FOLVITE) 1 MG tablet Take 1 mg by mouth.   . gabapentin (NEURONTIN) 300 MG capsule Take two times a day, then increase to three times a day in a few weeks.   .Marland Kitchenloperamide (IMODIUM) 2 MG capsule Take 1 capsule (2 mg  total) by mouth after each loose stool (max 157mper day).   . metoprolol succinate (TOPROL-XL) 100 MG 24 hr tablet 50 mg.   . Multiple Vitamins-Minerals (OCUVITE PO) Take by mouth daily.  09[place]2016: [place]  . ondansetron (ZOFRAN) 8 MG tablet Take 1 tablet (8 mg total) by mouth every 8 (eight) hours as needed for nausea or vomiting.   . pantoprazole (PROTONIX) 40 MG tablet Take 40 mg by mouth daily. 03/29/2017: Takes prn  . polyethylene glycol (MIRALAX / GLYCOLAX) packet Take 17 g by mouth daily as needed.   . prochlorperazine (COMPAZINE) 10 MG tablet Take 1 tablet (10 mg total) by mouth every 6 (six) hours as needed for nausea or vomiting.   . Skin Protectants, Misc. (EUCERIN) cream Apply topically.   . sulfamethoxazole-trimethoprim (BACTRIM DS,SEPTRA DS) 800-160 MG tablet Take 1 tablet by mouth every Monday, Wednesday, and Friday.   . tamoxifen (NOLVADEX) 20 MG tablet TAKE 1 TABLET EVERY DAY    No facility-administered encounter medications on file as of 03/21/2018.     ALLERGIES:  No Known Allergies   PHYSICAL EXAM:  ECOG Performance status:1  Vitals:   03/21/18 1030  BP: 129/74  Pulse: 77  Resp: 20  Temp: 98.5 F (36.9 C)  SpO2: 98%   Filed Weights   03/21/18 1030  Weight: 195 lb 9.6 oz (88.7 kg)    Physical Exam  Constitutional: He is well-developed, well-nourished, and in no distress.  Cardiovascular: Normal rate and regular rhythm.  Pulmonary/Chest: Effort normal and breath sounds normal. He has no wheezes.  Psychiatric: Mood, memory, affect and judgment normal.  LABORATORY DATA:  I have reviewed the labs as listed.  CBC    Component Value Date/Time   WBC 4.4 03/21/2018 0956   RBC 3.08 (L) 03/21/2018 0956   HGB 10.3 (L) 03/21/2018 0956   HCT 31.2 (L) 03/21/2018 0956   PLT 82 (L) 03/21/2018 0956   MCV 101.3 (H) 03/21/2018 0956   MCH 33.4 03/21/2018 0956   MCHC 33.0 03/21/2018 0956   RDW  13.1 03/21/2018 0956   LYMPHSABS 0.3 (L) 03/21/2018 0956   MONOABS 0.5 03/21/2018 0956   EOSABS 0.2 03/21/2018 0956   BASOSABS 0.0 03/21/2018 0956   CMP Latest Ref Rng & Units 03/21/2018 03/14/2018 02/28/2018  Glucose 65 - 99 mg/dL 107(H) 100(H) 93  BUN 6 - 20 mg/dL _0 Creatinine 0.61 - 1.24 mg/dL 1.28(H) 1.13 1.37(H)  Sodium 135 - 145 mmol/L 137 139 137  Potassium 3.5 - 5.1 mmol/L 4.1 4.0 4.4  Chloride 101 - 111 mmol/L 106 107 106  CO2 22 - 32 mmol/L _1 Calcium 8.9 - 10.3 mg/dL 8.3(L) 8.7(L) 9.0  Total Protein 6.5 - 8.1 g/dL 5.8(L) 5.6(L) 6.1(L)  Total Bilirubin 0.3 - 1.2 mg/dL 0.6 0.3 0.1(L)  Alkaline Phos 38 - 126 U/L 32(L) 28(L) 27(L)  AST 15 - 41 U/L _2 ALT 17 - 63 U/L 17 13(L) 14(L)       ASSESSMENT & PLAN:   Multiple myeloma ([name] 1.  Stage II IgG kappa multiple myeloma (Dx February 2018): -RVD from 03/01/2017 through 06/29/2017 -Chemotherapy changed to 3 cycles of CCyD from 07/12/2017-04/2017 to obtain deeper response - Status post auto stem cell transplant on 10/08/2017 -Stable M spike after transplant, started on CCyD, cycle 1 on 02/14/2018, cycle 2 on 03/14/2018 -He is tolerating current regimen very well.  His platelet count is 84,000 today.  He will proceed with day 8 and 9 of carfilzomib and day 8 of Cytoxan today.  He has an appointment to see Dr. Norma Fredrickson at [facility] on 04/04/2018.  His M spike after first cycle was stable at 0.2 g/dL.  2.  Metastatic left breast cancer to the bones (Dx 2011): He was initially treated with lumpectomy.  He was initiated on tamoxifen.  Tamoxifen was restarted back 30 days after his bone marrow transplant.  3.  Peripheral neuropathy: He has tingling in the toes which has been stable.  He is taking gabapentin 300 mg twice a day.  4.  Infection prophylaxis: He is continuing acyclovir 400 mg twice daily.  He takes Bactrim DS 1 tablet Monday Wednesday Friday.  5.  Hypertension: He is continuing metoprolol XL 50 mg  daily.  6. He has slight phlebitis on the left hand from prior chemotherapy.  He is reluctant to consider port placement at this time.  He was told to use hot compresses and NSAIDs.    Orders placed this encounter:  No orders of the defined types were placed in this encounter.     Derek Jack, MD [place] [phone]

## 2018-03-21 NOTE — Progress Notes (Signed)
Johnny Lucas tolerated chemo tx well without complaints or incident. Peripheral IV saline locked, flushed and wrapped with gauze for use tomorrow. IV site is WNL.

## 2018-03-21 NOTE — Patient Instructions (Signed)
[facility] at [facility] Discharge Instructions  Today you saw Dr. K.   Thank you for choosing [facility] at [facility] to provide your oncology and hematology care.  To afford each patient quality time with our provider, please arrive at least 15 minutes before your scheduled appointment time.   If you have a lab appointment with the Cancer Center please come in thru the  Main Entrance and check in at the main information desk  You need to re-schedule your appointment should you arrive 10 or more minutes late.  We strive to give you quality time with our providers, and arriving late affects you and other patients whose appointments are after yours.  Also, if you no show three or more times for appointments you may be dismissed from the clinic at the providers discretion.     Again, thank you for choosing [facility].  Our hope is that these requests will decrease the amount of time that you wait before being seen by our physicians.       _____________________________________________________________  Should you have questions after your visit to [facility], please contact our office at [phone] between the hours of 8:30 a.m. and 4:30 p.m.  Voicemails left after 4:30 p.m. will not be returned until the following business day.  For prescription refill requests, have your pharmacy contact our office.       Resources For Cancer Patients and their Caregivers ? American Cancer Society: Can assist with transportation, wigs, general needs, runs Look Good Feel Better.        1-888-227-6333 ? Cancer Care: Provides financial assistance, online support groups, medication/co-pay assistance.  1-800-813-HOPE (4673) ? [facility] Assists [place] cancer patients and their families through emotional , educational and financial support.  336-427-4357 ? [place] DSS Where to apply for food  stamps, Medicaid and utility assistance. 336-342-1394 ? [facility]: Transportation to medical appointments. 336-347-2287 ? Social Security Administration: May apply for disability if have a Stage IV cancer. 336-342-7796 1-800-772-1213 ? [place] Aging, Disability and Transit Services: Assists with nutrition, care and transit needs. 336-349-2343  Cancer Center Support Programs:   > Cancer Support Group  2nd Tuesday of the month 1pm-2pm, Journey Room   > Creative Journey  3rd Tuesday of the month 1130am-1pm, Journey Room    

## 2018-03-21 NOTE — Progress Notes (Signed)
Labs reviewed by MD. Platelets are 82,000. Ok to proceed with treatment per Dr. Delton Coombes.

## 2018-03-21 NOTE — Assessment & Plan Note (Signed)
1.  Stage II IgG kappa multiple myeloma (Dx February 2018): -RVD from 03/01/2017 through 06/29/2017 -Chemotherapy changed to 3 cycles of CCyD from 07/12/2017-04/2017 to obtain deeper response - Status post auto stem cell transplant on 10/08/2017 -Stable M spike after transplant, started on CCyD, cycle 1 on 02/14/2018, cycle 2 on 03/14/2018 -He is tolerating current regimen very well.  His platelet count is 84,000 today.  He will proceed with day 8 and 9 of carfilzomib and day 8 of Cytoxan today.  He has an appointment to see Dr. Norma Fredrickson at [facility] on 04/04/2018.  His M spike after first cycle was stable at 0.2 g/dL.  2.  Metastatic left breast cancer to the bones (Dx 2011): He was initially treated with lumpectomy.  He was initiated on tamoxifen.  Tamoxifen was restarted back 30 days after his bone marrow transplant.  3.  Peripheral neuropathy: He has tingling in the toes which has been stable.  He is taking gabapentin 300 mg twice a day.  4.  Infection prophylaxis: He is continuing acyclovir 400 mg twice daily.  He takes Bactrim DS 1 tablet Monday Wednesday Friday.  5.  Hypertension: He is continuing metoprolol XL 50 mg daily.

## 2018-03-22 ENCOUNTER — Inpatient Hospital Stay (HOSPITAL_COMMUNITY): Payer: Medicare Other

## 2018-03-22 VITALS — BP 129/72 | HR 60 | Temp 98.4°F | Resp 18

## 2018-03-22 DIAGNOSIS — C7951 Secondary malignant neoplasm of bone: Secondary | ICD-10-CM | POA: Diagnosis not present

## 2018-03-22 DIAGNOSIS — Z5111 Encounter for antineoplastic chemotherapy: Secondary | ICD-10-CM | POA: Diagnosis not present

## 2018-03-22 DIAGNOSIS — C50922 Malignant neoplasm of unspecified site of left male breast: Secondary | ICD-10-CM | POA: Diagnosis not present

## 2018-03-22 DIAGNOSIS — C9000 Multiple myeloma not having achieved remission: Secondary | ICD-10-CM | POA: Diagnosis not present

## 2018-03-22 DIAGNOSIS — Z170 Estrogen receptor positive status [ER+]: Secondary | ICD-10-CM | POA: Diagnosis not present

## 2018-03-22 DIAGNOSIS — Z79899 Other long term (current) drug therapy: Secondary | ICD-10-CM | POA: Diagnosis not present

## 2018-03-22 LAB — PROTEIN ELECTROPHORESIS, SERUM
A/G RATIO SPE: 1.5 (ref 0.7–1.7)
ALBUMIN ELP: 3.2 g/dL (ref 2.9–4.4)
Alpha-1-Globulin: 0.3 g/dL (ref 0.0–0.4)
Alpha-2-Globulin: 0.7 g/dL (ref 0.4–1.0)
BETA GLOBULIN: 0.8 g/dL (ref 0.7–1.3)
Gamma Globulin: 0.4 g/dL (ref 0.4–1.8)
Globulin, Total: 2.1 g/dL — ABNORMAL LOW (ref 2.2–3.9)
M-Spike, %: 0.2 g/dL — ABNORMAL HIGH
Total Protein ELP: 5.3 g/dL — ABNORMAL LOW (ref 6.0–8.5)

## 2018-03-22 LAB — IGG, IGA, IGM
IGM (IMMUNOGLOBULIN M), SRM: 11 mg/dL — AB (ref 20–172)
IgA: <5 mg/dL — ABNORMAL LOW (ref 61–437)
IgG (Immunoglobin G), Serum: 409 mg/dL — ABNORMAL LOW (ref 700–1600)

## 2018-03-22 LAB — KAPPA/LAMBDA LIGHT CHAINS
KAPPA FREE LGHT CHN: 15.9 mg/L (ref 3.3–19.4)
KAPPA, LAMDA LIGHT CHAIN RATIO: 8.83 — AB (ref 0.26–1.65)
LAMDA FREE LIGHT CHAINS: 1.8 mg/L — AB (ref 5.7–26.3)

## 2018-03-22 LAB — BETA 2 MICROGLOBULIN, SERUM: Beta-2 Microglobulin: 3 mg/L — ABNORMAL HIGH (ref 0.6–2.4)

## 2018-03-22 MED ORDER — HEPARIN SOD (PORK) LOCK FLUSH 100 UNIT/ML IV SOLN: 500.0000 [IU] | Freq: Once | INTRAVENOUS | Status: DC | PRN

## 2018-03-22 MED ORDER — DEXTROSE 5 % IV SOLN
36.0000 mg/m2 | Freq: Once | INTRAVENOUS | Status: AC
  Administered 2018-03-22: 76 mg via INTRAVENOUS
  Filled 2018-03-22: qty 30

## 2018-03-22 MED ORDER — DEXAMETHASONE SODIUM PHOSPHATE 10 MG/ML IJ SOLN
INTRAMUSCULAR | Status: AC
  Filled 2018-03-22: qty 1

## 2018-03-22 MED ORDER — DEXAMETHASONE SODIUM PHOSPHATE 10 MG/ML IJ SOLN
10.0000 mg | Freq: Once | INTRAMUSCULAR | Status: AC
  Administered 2018-03-22: 10 mg via INTRAVENOUS

## 2018-03-22 MED ORDER — SODIUM CHLORIDE 0.9 % IV SOLN
Freq: Once | INTRAVENOUS | Status: AC
  Administered 2018-03-22: 11:00:00 via INTRAVENOUS

## 2018-03-22 NOTE — Progress Notes (Signed)
Tolerated infusion w/o adverse reaction.  Alert, in no distress.  VSS.  Discharged ambulatory in c/o spouse.  

## 2018-03-23 LAB — IMMUNOFIXATION ELECTROPHORESIS
IGG (IMMUNOGLOBIN G), SERUM: 395 mg/dL — AB (ref 700–1600)
IGM (IMMUNOGLOBULIN M), SRM: 13 mg/dL — AB (ref 20–172)
IgA: <5 mg/dL — ABNORMAL LOW (ref 61–437)
Total Protein ELP: 5.4 g/dL — ABNORMAL LOW (ref 6.0–8.5)

## 2018-03-28 ENCOUNTER — Encounter (HOSPITAL_COMMUNITY): Payer: Self-pay

## 2018-03-28 ENCOUNTER — Ambulatory Visit (HOSPITAL_COMMUNITY): Payer: Medicare Other

## 2018-03-28 ENCOUNTER — Inpatient Hospital Stay (HOSPITAL_COMMUNITY): Payer: Medicare Other | Attending: Internal Medicine

## 2018-03-28 VITALS — BP 149/75 | HR 66 | Temp 98.1°F | Resp 18 | Wt 195.6 lb

## 2018-03-28 DIAGNOSIS — Z5111 Encounter for antineoplastic chemotherapy: Secondary | ICD-10-CM | POA: Diagnosis not present

## 2018-03-28 DIAGNOSIS — Z923 Personal history of irradiation: Secondary | ICD-10-CM | POA: Diagnosis not present

## 2018-03-28 DIAGNOSIS — I10 Essential (primary) hypertension: Secondary | ICD-10-CM | POA: Insufficient documentation

## 2018-03-28 DIAGNOSIS — Z87891 Personal history of nicotine dependence: Secondary | ICD-10-CM | POA: Insufficient documentation

## 2018-03-28 DIAGNOSIS — Z79899 Other long term (current) drug therapy: Secondary | ICD-10-CM | POA: Insufficient documentation

## 2018-03-28 DIAGNOSIS — C9001 Multiple myeloma in remission: Secondary | ICD-10-CM | POA: Insufficient documentation

## 2018-03-28 DIAGNOSIS — G629 Polyneuropathy, unspecified: Secondary | ICD-10-CM | POA: Diagnosis not present

## 2018-03-28 DIAGNOSIS — C9000 Multiple myeloma not having achieved remission: Secondary | ICD-10-CM

## 2018-03-28 DIAGNOSIS — Z9221 Personal history of antineoplastic chemotherapy: Secondary | ICD-10-CM | POA: Insufficient documentation

## 2018-03-28 LAB — CBC WITH DIFFERENTIAL/PLATELET
BASOS ABS: 0 10*3/uL (ref 0.0–0.1)
Basophils Relative: 0 %
EOS PCT: 2 %
Eosinophils Absolute: 0.1 10*3/uL (ref 0.0–0.7)
HCT: 29.8 % — ABNORMAL LOW (ref 39.0–52.0)
Hemoglobin: 9.9 g/dL — ABNORMAL LOW (ref 13.0–17.0)
Lymphocytes Relative: 9 %
Lymphs Abs: 0.5 10*3/uL — ABNORMAL LOW (ref 0.7–4.0)
MCH: 33.7 pg (ref 26.0–34.0)
MCHC: 33.2 g/dL (ref 30.0–36.0)
MCV: 101.4 fL — ABNORMAL HIGH (ref 78.0–100.0)
MONO ABS: 0.2 10*3/uL (ref 0.1–1.0)
Monocytes Relative: 4 %
Neutro Abs: 4.6 10*3/uL (ref 1.7–7.7)
Neutrophils Relative %: 85 %
PLATELETS: 116 10*3/uL — AB (ref 150–400)
RBC: 2.94 MIL/uL — ABNORMAL LOW (ref 4.22–5.81)
RDW: 12.7 % (ref 11.5–15.5)
WBC: 5.4 10*3/uL (ref 4.0–10.5)

## 2018-03-28 LAB — COMPREHENSIVE METABOLIC PANEL
ALBUMIN: 3.6 g/dL (ref 3.5–5.0)
ALK PHOS: 29 U/L — AB (ref 38–126)
ALT: 15 U/L — AB (ref 17–63)
ANION GAP: 9 (ref 5–15)
AST: 17 U/L (ref 15–41)
BILIRUBIN TOTAL: 0.6 mg/dL (ref 0.3–1.2)
BUN: 16 mg/dL (ref 6–20)
CO2: 24 mmol/L (ref 22–32)
CREATININE: 1.09 mg/dL (ref 0.61–1.24)
Calcium: 8.9 mg/dL (ref 8.9–10.3)
Chloride: 105 mmol/L (ref 101–111)
GFR calc Af Amer: >60 mL/min (ref >60)
GFR calc non Af Amer: >60 mL/min (ref >60)
GLUCOSE: 90 mg/dL (ref 65–99)
Potassium: 4.5 mmol/L (ref 3.5–5.1)
Sodium: 138 mmol/L (ref 135–145)
TOTAL PROTEIN: 5.9 g/dL — AB (ref 6.5–8.1)

## 2018-03-28 LAB — LACTATE DEHYDROGENASE: LDH: 140 U/L (ref 98–192)

## 2018-03-28 MED ORDER — DEXTROSE 5 % IV SOLN
36.0000 mg/m2 | Freq: Once | INTRAVENOUS | Status: AC
  Administered 2018-03-28: 76 mg via INTRAVENOUS
  Filled 2018-03-28: qty 8

## 2018-03-28 MED ORDER — SODIUM CHLORIDE 0.9 % IV SOLN
Freq: Once | INTRAVENOUS | Status: AC
  Administered 2018-03-28: 500 mL via INTRAVENOUS

## 2018-03-28 MED ORDER — PALONOSETRON HCL INJECTION 0.25 MG/5ML
0.2500 mg | Freq: Once | INTRAVENOUS | Status: AC
  Administered 2018-03-28: 0.25 mg via INTRAVENOUS
  Filled 2018-03-28: qty 5

## 2018-03-28 MED ORDER — DEXAMETHASONE SODIUM PHOSPHATE 10 MG/ML IJ SOLN
10.0000 mg | Freq: Once | INTRAMUSCULAR | Status: AC
  Administered 2018-03-28: 10 mg via INTRAVENOUS
  Filled 2018-03-28: qty 1

## 2018-03-28 MED ORDER — CYCLOPHOSPHAMIDE CHEMO INJECTION 1 GM
300.0000 mg/m2 | Freq: Once | INTRAMUSCULAR | Status: AC
  Administered 2018-03-28: 640 mg via INTRAVENOUS
  Filled 2018-03-28: qty 32

## 2018-03-28 MED ORDER — SODIUM CHLORIDE 0.9% FLUSH
10.0000 mL | INTRAVENOUS | Status: DC | PRN
Start: 2018-03-28 — End: 2018-03-28
  Administered 2018-03-28: 10 mL
  Filled 2018-03-28: qty 10

## 2018-03-28 NOTE — Progress Notes (Signed)
Patient to treatment room for chemotherapy.  Peripheral IV started with good blood return but IV catheter would not advance as needed.  No complaints of pain with flush.  When rechecking peripheral site for blood return site infiltrated.  New peripheral IV started above original IV site with good blood return with IV catheter advanced completely.  No complaints of pain at site with flush.  Site dressed.    Patient tolerated chemotherapy with no complaints voiced.  Peripheral IV site clean and dry with good blood return noted before and after administration of chemotherapy.  No complaints of pain with flush.  No bruising or swelling noted at site.  Peripheral IV intact and dressing intact and dressed with kerlix for comfort.  VSS with discharge and left ambulatory with wife with no s/s of distress noted.

## 2018-03-28 NOTE — Patient Instructions (Signed)
[facility] Discharge Instructions for Patients Receiving Chemotherapy  Today you received the following chemotherapy agents cytoxan and krypolis.    If you develop nausea and vomiting that is not controlled by your nausea medication, call the clinic.   BELOW ARE SYMPTOMS THAT SHOULD BE REPORTED IMMEDIATELY:  *FEVER GREATER THAN 100.5 F  *CHILLS WITH OR WITHOUT FEVER  NAUSEA AND VOMITING THAT IS NOT CONTROLLED WITH YOUR NAUSEA MEDICATION  *UNUSUAL SHORTNESS OF BREATH  *UNUSUAL BRUISING OR BLEEDING  TENDERNESS IN MOUTH AND THROAT WITH OR WITHOUT PRESENCE OF ULCERS  *URINARY PROBLEMS  *BOWEL PROBLEMS  UNUSUAL RASH Items with * indicate a potential emergency and should be followed up as soon as possible.  Feel free to call the clinic should you have any questions or concerns. The clinic phone number is [phone].  Please show the [facility] at check-in to the Emergency Department and triage nurse.

## 2018-03-29 ENCOUNTER — Inpatient Hospital Stay (HOSPITAL_COMMUNITY): Payer: Medicare Other

## 2018-03-29 ENCOUNTER — Encounter (HOSPITAL_COMMUNITY): Payer: Self-pay

## 2018-03-29 ENCOUNTER — Other Ambulatory Visit (HOSPITAL_COMMUNITY): Payer: Self-pay

## 2018-03-29 ENCOUNTER — Ambulatory Visit (HOSPITAL_COMMUNITY): Payer: Medicare Other

## 2018-03-29 VITALS — BP 150/66 | HR 61 | Temp 97.9°F | Resp 16

## 2018-03-29 DIAGNOSIS — Z5111 Encounter for antineoplastic chemotherapy: Secondary | ICD-10-CM | POA: Diagnosis not present

## 2018-03-29 DIAGNOSIS — C9000 Multiple myeloma not having achieved remission: Secondary | ICD-10-CM

## 2018-03-29 DIAGNOSIS — I10 Essential (primary) hypertension: Secondary | ICD-10-CM | POA: Diagnosis not present

## 2018-03-29 DIAGNOSIS — Z9221 Personal history of antineoplastic chemotherapy: Secondary | ICD-10-CM | POA: Diagnosis not present

## 2018-03-29 DIAGNOSIS — Z923 Personal history of irradiation: Secondary | ICD-10-CM | POA: Diagnosis not present

## 2018-03-29 DIAGNOSIS — C9001 Multiple myeloma in remission: Secondary | ICD-10-CM | POA: Diagnosis not present

## 2018-03-29 DIAGNOSIS — G629 Polyneuropathy, unspecified: Secondary | ICD-10-CM | POA: Diagnosis not present

## 2018-03-29 MED ORDER — DEXTROSE 5 % IV SOLN
36.0000 mg/m2 | Freq: Once | INTRAVENOUS | Status: AC
  Administered 2018-03-29: 76 mg via INTRAVENOUS
  Filled 2018-03-29: qty 8

## 2018-03-29 MED ORDER — DEXAMETHASONE SODIUM PHOSPHATE 10 MG/ML IJ SOLN
INTRAMUSCULAR | Status: AC
  Filled 2018-03-29: qty 1

## 2018-03-29 MED ORDER — SODIUM CHLORIDE 0.9% FLUSH
10.0000 mL | INTRAVENOUS | Status: DC | PRN
Start: 2018-03-29 — End: 2018-03-29
  Administered 2018-03-29: 10 mL
  Filled 2018-03-29: qty 10

## 2018-03-29 MED ORDER — SODIUM CHLORIDE 0.9 % IV SOLN
Freq: Once | INTRAVENOUS | Status: AC
  Administered 2018-03-29: 500 mL via INTRAVENOUS

## 2018-03-29 MED ORDER — ACYCLOVIR 400 MG PO TABS: 400.0000 mg | ORAL_TABLET | Freq: Two times a day (BID) | ORAL | 3 refills | Status: DC

## 2018-03-29 MED ORDER — DEXAMETHASONE SODIUM PHOSPHATE 10 MG/ML IJ SOLN
10.0000 mg | Freq: Once | INTRAMUSCULAR | Status: AC
  Administered 2018-03-29: 10 mg via INTRAVENOUS

## 2018-03-29 NOTE — Progress Notes (Signed)
To treatment area for chemotherapy.  Peripheral IV site intact with no bruising or swelling noted at site.  No complaints of pain with flush.    Patient tolerated chemotherapy with no complaints voiced.  Peripheral IV site clean and dry with no bruising or swelling noted at site.  Denied pain at site.  Band aid applied.  VSS with discharge and left ambulatory with wife.  No s/s of distress noted.

## 2018-03-29 NOTE — Telephone Encounter (Signed)
Received refill request from patients pharmacy for acyclovir. Reviewed with provider, chart checked and refilled.

## 2018-03-29 NOTE — Patient Instructions (Signed)
[facility] Discharge Instructions for Patients Receiving Chemotherapy  Today you received the following chemotherapy agents Kyprolis today.    If you develop nausea and vomiting that is not controlled by your nausea medication, call the clinic.   BELOW ARE SYMPTOMS THAT SHOULD BE REPORTED IMMEDIATELY:  *FEVER GREATER THAN 100.5 F  *CHILLS WITH OR WITHOUT FEVER  NAUSEA AND VOMITING THAT IS NOT CONTROLLED WITH YOUR NAUSEA MEDICATION  *UNUSUAL SHORTNESS OF BREATH  *UNUSUAL BRUISING OR BLEEDING  TENDERNESS IN MOUTH AND THROAT WITH OR WITHOUT PRESENCE OF ULCERS  *URINARY PROBLEMS  *BOWEL PROBLEMS  UNUSUAL RASH Items with * indicate a potential emergency and should be followed up as soon as possible.  Feel free to call the clinic should you have any questions or concerns. The clinic phone number is [phone].  Please show the [place] at check-in to the Emergency Department and triage nurse.

## 2018-04-11 ENCOUNTER — Ambulatory Visit (HOSPITAL_COMMUNITY): Payer: Medicare Other | Admitting: Hematology

## 2018-04-11 DIAGNOSIS — Z9889 Other specified postprocedural states: Secondary | ICD-10-CM | POA: Diagnosis not present

## 2018-04-11 DIAGNOSIS — G629 Polyneuropathy, unspecified: Secondary | ICD-10-CM | POA: Diagnosis not present

## 2018-04-11 DIAGNOSIS — C50929 Malignant neoplasm of unspecified site of unspecified male breast: Secondary | ICD-10-CM | POA: Diagnosis not present

## 2018-04-11 DIAGNOSIS — Z853 Personal history of malignant neoplasm of breast: Secondary | ICD-10-CM | POA: Diagnosis not present

## 2018-04-11 DIAGNOSIS — Z9484 Stem cells transplant status: Secondary | ICD-10-CM | POA: Diagnosis not present

## 2018-04-11 DIAGNOSIS — Z23 Encounter for immunization: Secondary | ICD-10-CM | POA: Diagnosis not present

## 2018-04-11 DIAGNOSIS — Z7983 Long term (current) use of bisphosphonates: Secondary | ICD-10-CM | POA: Diagnosis not present

## 2018-04-11 DIAGNOSIS — Z79899 Other long term (current) drug therapy: Secondary | ICD-10-CM | POA: Diagnosis not present

## 2018-04-11 DIAGNOSIS — C9000 Multiple myeloma not having achieved remission: Secondary | ICD-10-CM | POA: Diagnosis not present

## 2018-04-16 IMAGING — US US BREAST*L* LIMITED INC AXILLA
1 series · 9 of 9 positions shown · non-contrast
Comparison: 12/31/2009

CLINICAL DATA: 70-year-old male with an history of biopsy proven
left breast cancer presents for bilateral mammogram. Recent progress
note from the patient's visit at the Medical Oncology/Hematology
clinic at [HOSPITAL] [HOSPITAL] dated 03/15/2017 was reviewed.

[Series 1: us breast*left* limited inc axilla · 0.07mm/px · 9 of 9 slices shown]
[im 1/9]
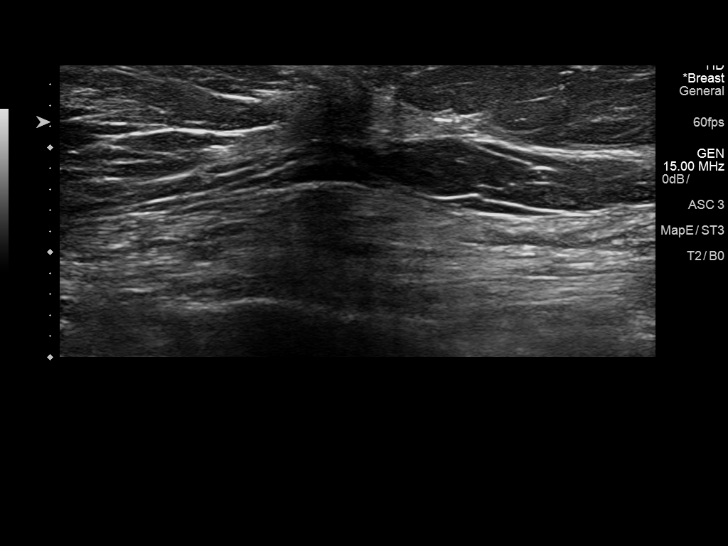
[im 2/9]
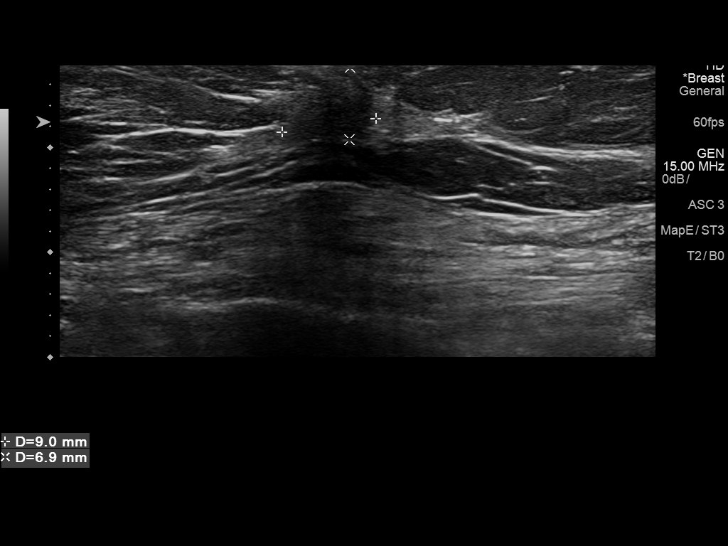
[im 3/9]
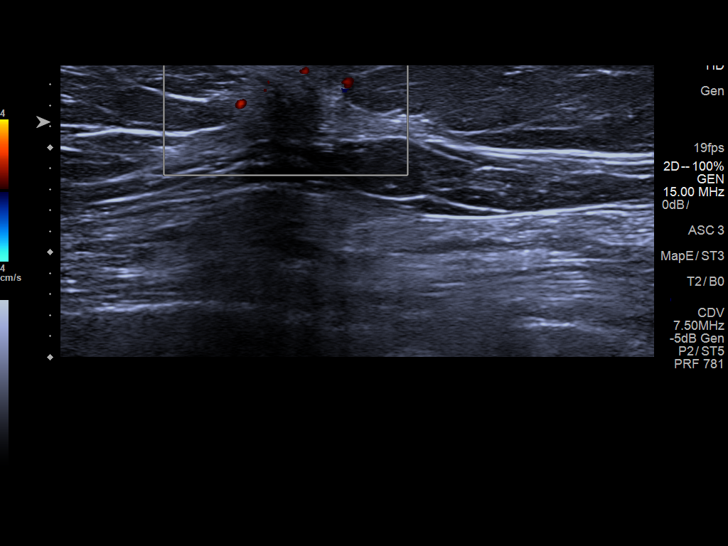
[im 4/9]
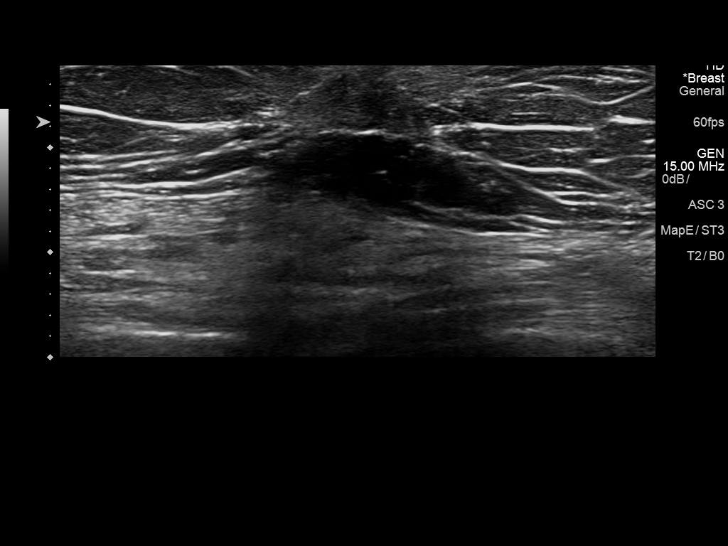
[im 5/9]
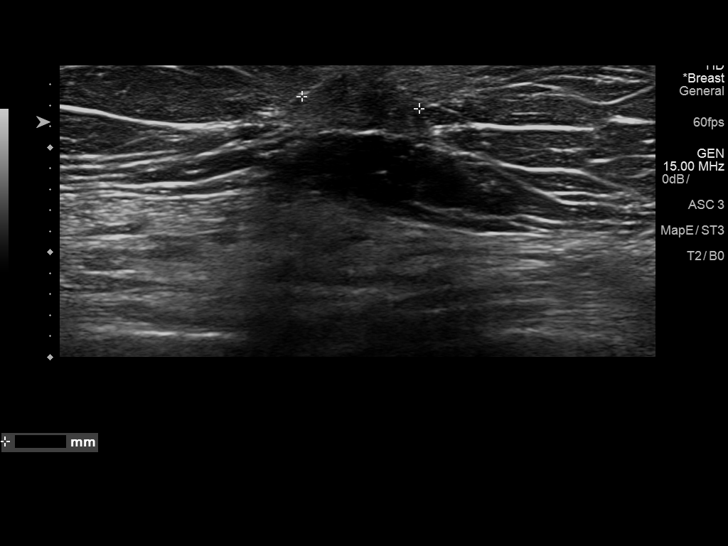
[im 6/9]
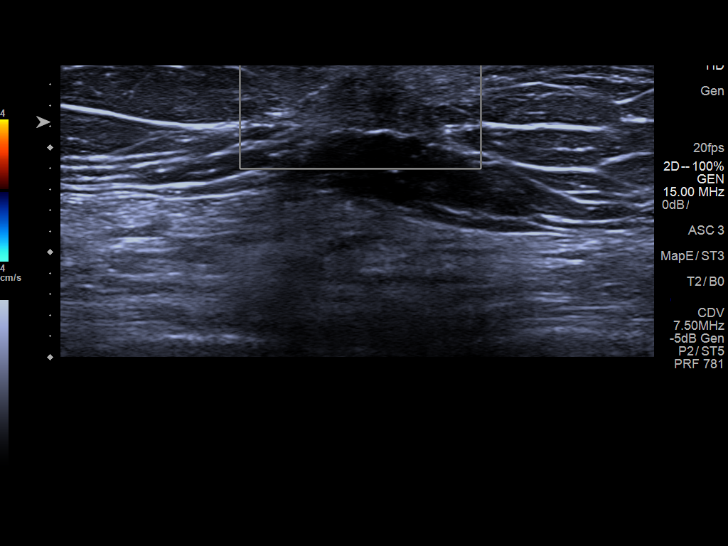
[im 7/9]
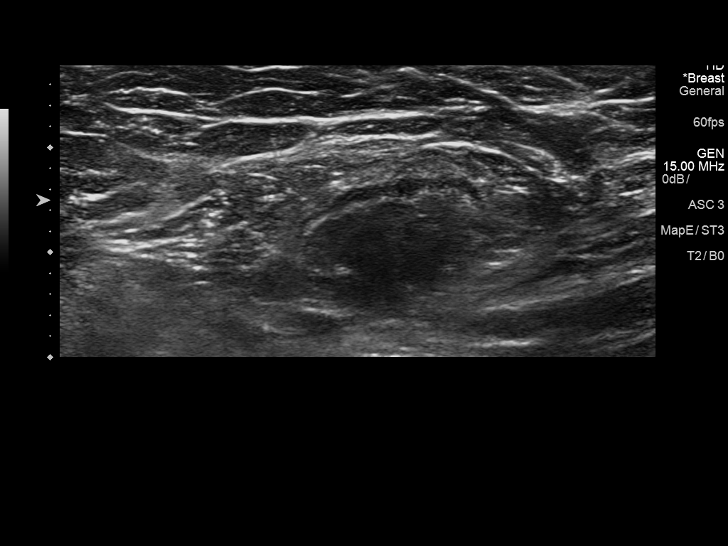
[im 8/9]
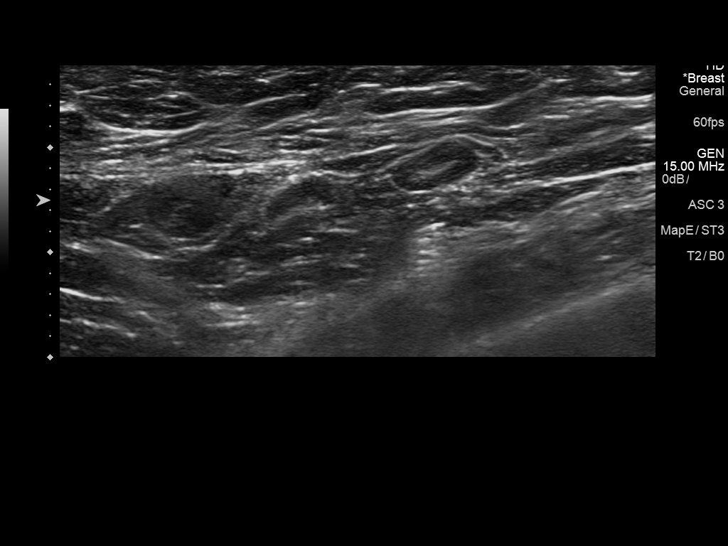
[im 9/9]
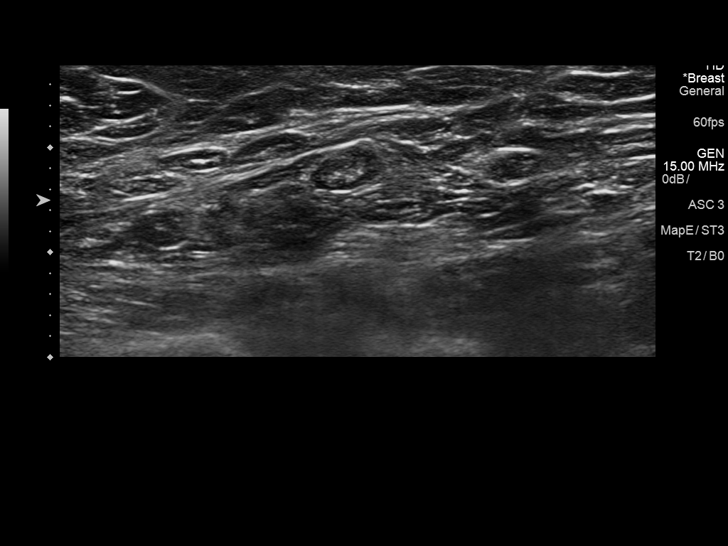

[9 of 9 positions shown; findings below may reference images not displayed]

Prior bilateral mammogram performed at [facility] [HOSPITAL] in
[HOSPITAL], [place] [HOSPITAL] on 12/31/2009 an a post biopsy clip mammogram
of the left breast dated 12/31/2009 are available and reviewed.

The patient tells me that prior to his breast cancer diagnosis on
the left he was evaluated by a surgeon for a tender retroareolar
left breast lump and subsequently had surgery, with benign results.
In discussion with the patient, it sounds as this may have been
surgery for symptomatic relief of gynecomastia. Please note that the
patient reports that he did not have a surgery to remove the
biopsy-proven left breast cancer after its was diagnosed.

The patient has been taking tamoxifen since 5722.

The patient has been diagnosed with multiple myeloma following bone
biopsy.

EXAM:
2D DIGITAL DIAGNOSTIC BILATERAL MAMMOGRAM WITH CAD AND ADJUNCT TOMO

ULTRASOUND LEFT BREAST
ACR Breast Density Category b: There are scattered areas of
fibroglandular density.
FINDINGS: There is mild retroareolar right gynecomastia. The gynecomastia has
decreased in prominence since the mammogram of 5722. There is no
mass, architectural distortion, or suspicious microcalcification in
the right breast to suggest malignancy.

In the left breast, there is surgical scarring superior to the
nipple in the central left breast consistent with reported history
of prior benign surgical biopsy of the left breast. Deep to this
scar is a spiculated mass containing a wing shaped biopsy clip. This
wing shaped biopsy clip was placed in December 2009 at the time of
the ultrasound-guided biopsy and appears unchanged in position
compared to the post clip mammogram December 2009. The spiculated
mass in the the 11 to 12 o'clock retroareolar left breast has
significantly decreased in size and density since the mammogram of
5722 consistent with interval antiestrogen therapy.

Mammographic images were processed with CAD.

On physical exam, there is a healed surgical scar in the superior
left breast (from prior benign surgical biopsy, performed before the
patient's diagnosis of left breast cancer, from my discussion with
the patient). I do not palpate a firm or suspicious mass in the
retroareolar left breast.

Targeted ultrasound is performed, showing a hypoechoic irregular
mass is seen in the retroareolar left breast at 12 o'clock position
measuring 0.9 x 0.7 x 1.1 cm. On the prior ultrasound of 5722, per
records in [REDACTED], the mass measured 2.4 x 1.9 x 3.0 cm. No discrete
vascular flow is identified within the hypoechoic mass. There is
some flow peripheral to the mass. Ultrasound of the left axilla is
negative for lymphadenopathy.
IMPRESSION: 1. Significant decrease in size of the biopsy proven malignancy in
the 12 o'clock retroareolar left breast, consistent with positive
response to antiestrogen therapy with tamoxifen since 5722. On
ultrasound, the mass currently measures 0.9 x 0.7 x 1.1 cm. The wing
shaped biopsy clip placed at the time of the ultrasound biopsy in
5722 remains within the mass. Please note that the patient's left
breast surgery was prior to the diagnosis of left breast cancer, and
the biopsied mass has not been surgically removed.
2. No evidence of malignancy in the right breast. Mild right
gynecomastia, which has decreased in prominence since the prior
mammogram of 5722.

RECOMMENDATION:
Bilateral diagnostic mammogram is recommended in 1 year, or earlier
as clinically indicated.

I have discussed the findings and recommendations with the patient.
Results were also provided in writing at the conclusion of the
visit. If applicable, a reminder letter will be sent to the patient
regarding the next appointment.

BI-RADS CATEGORY  6: Known biopsy-proven malignancy.

## 2018-04-18 ENCOUNTER — Other Ambulatory Visit: Payer: Self-pay

## 2018-04-18 ENCOUNTER — Inpatient Hospital Stay (HOSPITAL_COMMUNITY): Payer: Medicare Other

## 2018-04-18 ENCOUNTER — Inpatient Hospital Stay (HOSPITAL_BASED_OUTPATIENT_CLINIC_OR_DEPARTMENT_OTHER): Payer: Medicare Other | Admitting: Hematology

## 2018-04-18 ENCOUNTER — Encounter (HOSPITAL_COMMUNITY): Payer: Self-pay | Admitting: Hematology

## 2018-04-18 DIAGNOSIS — C9000 Multiple myeloma not having achieved remission: Secondary | ICD-10-CM

## 2018-04-18 DIAGNOSIS — Z87891 Personal history of nicotine dependence: Secondary | ICD-10-CM

## 2018-04-18 DIAGNOSIS — Z923 Personal history of irradiation: Secondary | ICD-10-CM | POA: Diagnosis not present

## 2018-04-18 DIAGNOSIS — C7951 Secondary malignant neoplasm of bone: Secondary | ICD-10-CM

## 2018-04-18 DIAGNOSIS — C9001 Multiple myeloma in remission: Secondary | ICD-10-CM

## 2018-04-18 DIAGNOSIS — Z9001 Acquired absence of eye: Secondary | ICD-10-CM | POA: Diagnosis not present

## 2018-04-18 DIAGNOSIS — I10 Essential (primary) hypertension: Secondary | ICD-10-CM | POA: Diagnosis not present

## 2018-04-18 DIAGNOSIS — R509 Fever, unspecified: Secondary | ICD-10-CM | POA: Diagnosis not present

## 2018-04-18 DIAGNOSIS — D631 Anemia in chronic kidney disease: Secondary | ICD-10-CM | POA: Diagnosis not present

## 2018-04-18 DIAGNOSIS — R531 Weakness: Secondary | ICD-10-CM | POA: Diagnosis not present

## 2018-04-18 DIAGNOSIS — Z79899 Other long term (current) drug therapy: Secondary | ICD-10-CM | POA: Diagnosis not present

## 2018-04-18 DIAGNOSIS — G629 Polyneuropathy, unspecified: Secondary | ICD-10-CM

## 2018-04-18 DIAGNOSIS — Z5111 Encounter for antineoplastic chemotherapy: Secondary | ICD-10-CM | POA: Diagnosis not present

## 2018-04-18 DIAGNOSIS — Z9221 Personal history of antineoplastic chemotherapy: Secondary | ICD-10-CM

## 2018-04-18 DIAGNOSIS — R000 Tachycardia, unspecified: Secondary | ICD-10-CM | POA: Diagnosis not present

## 2018-04-18 DIAGNOSIS — A419 Sepsis, unspecified organism: Secondary | ICD-10-CM | POA: Diagnosis not present

## 2018-04-18 DIAGNOSIS — N179 Acute kidney failure, unspecified: Secondary | ICD-10-CM | POA: Diagnosis not present

## 2018-04-18 LAB — CBC WITH DIFFERENTIAL/PLATELET
BASOS PCT: 1 %
Basophils Absolute: 0 10*3/uL (ref 0.0–0.1)
EOS ABS: 0.1 10*3/uL (ref 0.0–0.7)
EOS PCT: 4 %
HCT: 25.4 % — ABNORMAL LOW (ref 39.0–52.0)
HEMOGLOBIN: 8.3 g/dL — AB (ref 13.0–17.0)
LYMPHS ABS: 0.4 10*3/uL — AB (ref 0.7–4.0)
Lymphocytes Relative: 11 %
MCH: 34.9 pg — ABNORMAL HIGH (ref 26.0–34.0)
MCHC: 32.7 g/dL (ref 30.0–36.0)
MCV: 106.7 fL — ABNORMAL HIGH (ref 78.0–100.0)
Monocytes Absolute: 0.5 10*3/uL (ref 0.1–1.0)
Monocytes Relative: 14 %
NEUTROS PCT: 70 %
Neutro Abs: 2.6 10*3/uL (ref 1.7–7.7)
PLATELETS: 157 10*3/uL (ref 150–400)
RBC: 2.38 MIL/uL — AB (ref 4.22–5.81)
RDW: 15.2 % (ref 11.5–15.5)
WBC: 3.7 10*3/uL — AB (ref 4.0–10.5)

## 2018-04-18 LAB — COMPREHENSIVE METABOLIC PANEL
ALK PHOS: 28 U/L — AB (ref 38–126)
ALT: 14 U/L — AB (ref 17–63)
AST: 20 U/L (ref 15–41)
Albumin: 3.7 g/dL (ref 3.5–5.0)
Anion gap: 9 (ref 5–15)
BUN: 17 mg/dL (ref 6–20)
CO2: 23 mmol/L (ref 22–32)
Calcium: 9 mg/dL (ref 8.9–10.3)
Chloride: 106 mmol/L (ref 101–111)
Creatinine, Ser: 1.26 mg/dL — ABNORMAL HIGH (ref 0.61–1.24)
GFR calc Af Amer: >60 mL/min (ref >60)
GFR calc non Af Amer: 57 mL/min — ABNORMAL LOW (ref >60)
Glucose, Bld: 105 mg/dL — ABNORMAL HIGH (ref 65–99)
Potassium: 3.8 mmol/L (ref 3.5–5.1)
SODIUM: 138 mmol/L (ref 135–145)
TOTAL PROTEIN: 5.7 g/dL — AB (ref 6.5–8.1)
Total Bilirubin: 0.8 mg/dL (ref 0.3–1.2)

## 2018-04-18 LAB — LACTATE DEHYDROGENASE: LDH: 182 U/L (ref 98–192)

## 2018-04-18 MED ORDER — DENOSUMAB 120 MG/1.7ML ~~LOC~~ SOLN
120.0000 mg | Freq: Once | SUBCUTANEOUS | Status: AC
  Administered 2018-04-18: 120 mg via SUBCUTANEOUS
  Filled 2018-04-18: qty 1.7

## 2018-04-18 NOTE — Progress Notes (Signed)
Johnny Lucas, [place] [id number]   CLINIC:  Medical Oncology/Hematology  PCP:  Johnny Percy, MD [place] [phone]   REASON FOR VISIT:  Follow-up for multiple myeloma.  CURRENT THERAPY: Carfilzomib maintenance.  BRIEF ONCOLOGIC HISTORY:    Breast Lucas, male (Johnny Lucas)   12/31/2009 Initial Biopsy    Biopsy of L breast       12/31/2009 Pathology Results    Invasive ductal carcinoma, ER/PR+, HER 2 negative      12/31/2009 Imaging    Ultrasound showing a 2.43 x 1.85 x 3 cm hypoechoic spiculated mass in the 12 o clock L breast retroareolar region      01/01/2010 -  Anti-estrogen oral therapy    Tamoxifen 20 mg daily      01/06/2010 Imaging    Bone scan abnormal uptake in the diaphysis of the R humerus, abnormal in the R third, fifth and sixth ribs, lesion also noted in the sternum.      02/03/2010 Surgery    Rod placement and fixation of R humerus by Dr. Amedeo Lucas      02/05/2010 - 02/18/2010 Radiation Therapy    30Gy in 10 fractions of 3 Gy per fraction to R pathologic fracture      03/11/2010 -  Chemotherapy    Denosumab monthly, now every 3 months. Started at [facility]       06/09/2016 Imaging    Three hypermetabolic osseous lesions in the sternum, left ilium and right ilium, as discussed above, likely represent osseous metastases. At this time, these are not recognizable on the CT images. 2. No extra skeletal metastatic disease identified in the neck, chest, abdomen or pelvis.      10/13/2016 Progression    PET shows various new and enlarging osseous metastatic lesions with no definite extra osseous metastatic disease currently identified.       12/31/2016 Progression    1. Multifocal hypermetabolic osseous metastases throughout the axial and proximal appendicular skeleton, which are increased in size, number and metabolism since 10/13/2016 PET-CT. 2. 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 3. No additional sites of hypermetabolic metastatic disease. 4. Chronic right mastoid sinusitis. 5. Aortic atherosclerosis.  One vessel coronary atherosclerosis.       Multiple myeloma ([name])   02/12/2017 Bone Marrow Biopsy    The marrow was variably cellular with large peritrabecular aggregates of kappa restricted plasma cells (66% by aspirate, 30% by Cd138). Cytogenetics +11.       03/01/2017 - 06/29/2017 Chemotherapy    RVD       05/26/2017 Bone Marrow Biopsy    Performed at [facility]:  Plasma cell myeloma in a 30% cellularmarrow with decreased trilineage hematopoiesis and 42% kappalight chain restricted plasma cells on the aspirate smears andlarge aggregates on the core biopsy.       07/12/2017 - 09/01/2017 Chemotherapy    3 cycles of carfizolmib/cyclophosphamide/dexamethasone        10/08/2017 Bone Marrow Transplant    Autotransplant at [place]: Lucas Staging Multiple myeloma [facility]) Staging form: Plasma Cell Myeloma and Plasma Cell Disorders, AJCC 8th Edition - Clinical stage from 05/03/2017: Beta-2-microglobulin (mg/L): 3.5, Albumin (g/dL): 3.4, ISS: Stage II, High-risk cytogenetics: Absent, LDH: Not assessed - Signed by Johnny Cancer, PA-C on 05/03/2017  INTERVAL HISTORY:  Johnny Lucas 72 y.o. male returns for routine follow-up of multiple myeloma.  He was evaluated by Johnny Lucas last week at [facility].  He finished 2 cycles of carfilzomib, cyclophosphamide and dexamethasone.  He felt tired after those 2 cycles.  He denies any allergic reactions.  Denies any fevers or infections.  Denies any recent hospitalizations.  Numbness in the feet has been stable.  REVIEW OF SYSTEMS:  Review of Systems  Constitutional: Positive for fatigue.  HENT:  Negative.   Respiratory: Negative.   Cardiovascular:  Negative.   Gastrointestinal: Negative.   Genitourinary: Negative.    Musculoskeletal: Negative.   Skin: Negative.   Neurological: Positive for numbness.  Psychiatric/Behavioral: Negative.      PAST MEDICAL/SURGICAL HISTORY:  Past Medical History:  Diagnosis Date  . Anxiety   . Bone metastases ([name]) 09/10/2016  . Breast Lucas ([name]) 2011   Stave IV breast Lucas; radiation and tamoxifen  . Breast Lucas, male ([name])    Stave IV breast Lucas; radiation and tamoxifen  Overview:  Left breast ca with mets bone  Overview:  METS TO BONE  . GERD (gastroesophageal reflux disease)   . Hypertension   . Macular degeneration   . Multiple myeloma ([name]) 02/18/2017   Past Surgical History:  Procedure Laterality Date  . BACK SURGERY    . HERNIA REPAIR    . right arm surgery       SOCIAL HISTORY:  Social History   Socioeconomic History  . Marital status: Married    Spouse name: Not on file  . Number of children: 3  . Years of education: Not on file  . Highest education level: Not on file  Occupational History  . Not on file  Social Needs  . Financial resource strain: Not on file  . Food insecurity:    Worry: Not on file    Inability: Not on file  . Transportation needs:    Medical: Not on file    Non-medical: Not on file  Tobacco Use  . Smoking status: Never Smoker  . Smokeless tobacco: Former Network engineer and Sexual Activity  . Alcohol use: Yes    Alcohol/week: 14.4 oz    Types: 24 Cans of beer per week  . Drug use: No  . Sexual activity: Not on file  Lifestyle  . Physical activity:    Days per week: Not on file    Minutes per session: Not on file  . Stress: Not on file  Relationships  . Social connections:    Talks on phone: Not on file    Gets together: Not on file    Attends religious service: Not on file    Active member of club or organization: Not on file    Attends meetings of clubs or organizations: Not on file    Relationship status: Not on file  .  Intimate partner violence:    Fear of current or ex partner: Not on file    Emotionally abused: Not on file    Physically abused: Not on file    Forced sexual activity: Not on file  Other Topics Concern  . Not on file  Social History Narrative  . Not on file    FAMILY HISTORY:  Family History  Problem Relation Age of Onset  . Stroke Mother   . Lucas Maternal Aunt        Lucas NOS; died in her 20s  . Lung Lucas Maternal Uncle  smoker    CURRENT MEDICATIONS:  Outpatient Encounter Medications as of 04/18/2018  Medication Sig Note  . acyclovir (ZOVIRAX) 400 MG tablet Take 1 tablet (400 mg total) by mouth 2 (two) times daily.   Marland Kitchen albuterol (PROVENTIL HFA;VENTOLIN HFA) 108 (90 Base) MCG/ACT inhaler Inhale 2 puffs into the lungs every 6 (six) hours as needed for wheezing or shortness of breath.   . ALPRAZolam (XANAX) 0.5 MG tablet TAKE ONE TABLET BY MOUTH AT BEDTIME AS NEEDED FOR ANXIETY   . aspirin EC 81 MG tablet Take 162 mg by mouth daily.  09/10/2016: Received from: [place]: Take 162 mg by mouth daily.  . calcium-vitamin D (OSCAL WITH D) 500-200 MG-UNIT TABS tablet TAKE TWO TABLETS BY MOUTH DAILY 02/18/2018: Terrilee Files   . Carfilzomib (KYPROLIS IV) Inject into the vein. Day 1 and 2, day 8 and 9, day 15 and 16 every 28 days for 2 months   . cyclophosphamide (CYTOXAN) 2 g chemo injection Inject into the vein once. Day 1, day 8, day 15, every 28 days for 2 months   . dexamethasone (DECADRON) 4 MG tablet Take 10 tablets (30m) on days 1,8,15, and 22 of chemo. Repeat every 28 days.   . ferrous sulfate 325 (65 FE) MG EC tablet Take 1 tablet (325 mg total) by mouth 2 (two) times daily after a meal.   . folic acid (FOLVITE) 1 MG tablet Take 1 mg by mouth.   . gabapentin (NEURONTIN) 300 MG capsule Take two times a day, then increase to three times a day in a few weeks.   .Marland Kitchenloperamide (IMODIUM) 2 MG capsule Take 1 capsule (2 mg total) by mouth after each loose stool  (max 175mper day).   . metoprolol succinate (TOPROL-XL) 100 MG 24 hr tablet 50 mg.   . Multiple Vitamins-Minerals (OCUVITE PO) Take by mouth daily.  09/10/2016: [place] by mouth daily.  . ondansetron (ZOFRAN) 8 MG tablet Take 1 tablet (8 mg total) by mouth every 8 (eight) hours as needed for nausea or vomiting.   . pantoprazole (PROTONIX) 40 MG tablet Take 40 mg by mouth daily. 03/29/2017: Takes prn  . polyethylene glycol (MIRALAX / GLYCOLAX) packet Take 17 g by mouth daily as needed.   . prochlorperazine (COMPAZINE) 10 MG tablet Take 1 tablet (10 mg total) by mouth every 6 (six) hours as needed for nausea or vomiting.   . Skin Protectants, Misc. (EUCERIN) cream Apply topically.   . sulfamethoxazole-trimethoprim (BACTRIM DS,SEPTRA DS) 800-160 MG tablet Take 1 tablet by mouth every Monday, Wednesday, and Friday.   . tamoxifen (NOLVADEX) 20 MG tablet TAKE 1 TABLET EVERY DAY    No facility-administered encounter medications on file as of 04/18/2018.     ALLERGIES:  No Known Allergies   PHYSICAL EXAM:  ECOG Performance status: 1  Vitals:   04/18/18 0908  BP: (!) 150/73  Pulse: 75  Resp: 18  Temp: 98.8 F (37.1 C)  SpO2: 97%   Filed Weights   04/18/18 0908  Weight: 202 lb 6.4 oz (91.8 kg)      LABORATORY DATA:  I have reviewed the labs as listed.  CBC    Component Value Date/Time   WBC 3.7 (L) 04/18/2018 0822   RBC 2.38 (L) 04/18/2018 0822   HGB 8.3 (L) 04/18/2018 0822   HCT 25.4 (L) 04/18/2018 0822   PLT 157 04/18/2018 0822   MCV 106.7 (H) 04/18/2018 081308  MCH 34.9 (H) 04/18/2018 0822   MCHC 32.7 04/18/2018 0822   RDW 15.2 04/18/2018 0822   LYMPHSABS 0.4 (L) 04/18/2018 0822   MONOABS 0.5 04/18/2018 0822   EOSABS 0.1 04/18/2018 0822   BASOSABS 0.0 04/18/2018 0822   CMP Latest Ref Rng & Units 04/18/2018 03/28/2018 03/21/2018  Glucose 65 - 99 mg/dL 105(H) 90 107(H)  BUN 6 - 20 mg/dL _0 Creatinine 0.61 - 1.24 mg/dL  1.26(H) 1.09 1.28(H)  Sodium 135 - 145 mmol/L 138 138 137  Potassium 3.5 - 5.1 mmol/L 3.8 4.5 4.1  Chloride 101 - 111 mmol/L 106 105 106  CO2 22 - 32 mmol/L _1 Calcium 8.9 - 10.3 mg/dL 9.0 8.9 8.3(L)  Total Protein 6.5 - 8.1 g/dL 5.7(L) 5.9(L) 5.8(L)  Total Bilirubin 0.3 - 1.2 mg/dL 0.8 0.6 0.6  Alkaline Phos 38 - 126 U/L 28(L) 29(L) 32(L)  AST 15 - 41 U/L _2 ALT 17 - 63 U/L 14(L) 15(L) 17       ASSESSMENT & PLAN:   Multiple myeloma ([name] 1.  Stage II IgG kappa multiple myeloma (Dx February 2018): -RVD from 03/01/2017 through 06/29/2017 -Chemotherapy changed to 3 cycles of CCyD from 07/12/2017-04/2017 to obtain deeper response - Status post auto stem cell transplant on 10/08/2017 -Stable M spike after transplant, started on CCyD, cycle 1 on 02/14/2018, cycle 2 on 03/14/2018 -His M spike after 2 cycles was undetectable, when measured at [facility] last week.  He was recommended to have maintenance with carfilzomib 70 mg/m square on days 1 and 15 every 28 days by Johnny Lucas.  He will follow-up with him in 3 months.  We talked about initiating maintenance regimen tomorrow.  He is agreeable to this option.  He will receive denosumab injection today.  2.  Metastatic left breast Lucas to the bones (Dx 2011): He was initially treated with lumpectomy.  He was initiated on tamoxifen.  Tamoxifen was restarted back 30 days after his bone marrow transplant.  3.  Peripheral neuropathy: He has tingling in the toes which has been stable.  He is taking gabapentin 300 mg twice a day.  4.  Infection prophylaxis: His Bactrim was discontinued.  He will continue acyclovir 400 mg twice daily.  5.  Hypertension: He is continuing metoprolol XL 50 mg daily.      Orders placed this encounter:  Orders Placed This Encounter  Procedures  . Protein electrophoresis, serum  . Kappa/lambda light chains  . CBC  . Comprehensive metabolic panel      Derek Jack, MD [place] [phone]

## 2018-04-18 NOTE — Patient Instructions (Signed)
[facility] at [facility] Discharge Instructions  Today you saw Dr. K.   Thank you for choosing [facility] at [facility] to provide your oncology and hematology care.  To afford each patient quality time with our provider, please arrive at least 15 minutes before your scheduled appointment time.   If you have a lab appointment with the Cancer Center please come in thru the  Main Entrance and check in at the main information desk  You need to re-schedule your appointment should you arrive 10 or more minutes late.  We strive to give you quality time with our providers, and arriving late affects you and other patients whose appointments are after yours.  Also, if you no show three or more times for appointments you may be dismissed from the clinic at the providers discretion.     Again, thank you for choosing [facility].  Our hope is that these requests will decrease the amount of time that you wait before being seen by our physicians.       _____________________________________________________________  Should you have questions after your visit to [facility], please contact our office at [phone] between the hours of 8:30 a.m. and 4:30 p.m.  Voicemails left after 4:30 p.m. will not be returned until the following business day.  For prescription refill requests, have your pharmacy contact our office.       Resources For Cancer Patients and their Caregivers ? American Cancer Society: Can assist with transportation, wigs, general needs, runs Look Good Feel Better.        1-888-227-6333 ? Cancer Care: Provides financial assistance, online support groups, medication/co-pay assistance.  1-800-813-HOPE (4673) ? [facility] Assists [place] cancer patients and their families through emotional , educational and financial support.  336-427-4357 ? [place] DSS Where to apply for food  stamps, Medicaid and utility assistance. 336-342-1394 ? RCATS: Transportation to medical appointments. 336-347-2287 ? Social Security Administration: May apply for disability if have a Stage IV cancer. 336-342-7796 1-800-772-1213 ? [place] Aging, Disability and Transit Services: Assists with nutrition, care and transit needs. 336-349-2343  Cancer Center Support Programs:   > Cancer Support Group  2nd Tuesday of the month 1pm-2pm, Journey Room   > Creative Journey  3rd Tuesday of the month 1130am-1pm, Journey Room    

## 2018-04-18 NOTE — Progress Notes (Signed)
Johnny Lucas tolerated Xgeva injection well without complaints or incident. Calcium 9 today and pt denied any tooth or jaw pain and no recent or future dental appts. Pt discharged self ambulatory in satisfactory condition accompanied by family members

## 2018-04-18 NOTE — Assessment & Plan Note (Signed)
1.  Stage II IgG kappa multiple myeloma (Dx February 2018): -RVD from 03/01/2017 through 06/29/2017 -Chemotherapy changed to 3 cycles of CCyD from 07/12/2017-04/2017 to obtain deeper response - Status post auto stem cell transplant on 10/08/2017 -Stable M spike after transplant, started on CCyD, cycle 1 on 02/14/2018, cycle 2 on 03/14/2018 -His M spike after 2 cycles was undetectable, when measured at [facility] last week.  He was recommended to have maintenance with carfilzomib 70 mg/m square on days 1 and 15 every 28 days by Dr. Norma Fredrickson.  He will follow-up with him in 3 months.  We talked about initiating maintenance regimen tomorrow.  He is agreeable to this option.  He will receive denosumab injection today.  2.  Metastatic left breast cancer to the bones (Dx 2011): He was initially treated with lumpectomy.  He was initiated on tamoxifen.  Tamoxifen was restarted back 30 days after his bone marrow transplant.  3.  Peripheral neuropathy: He has tingling in the toes which has been stable.  He is taking gabapentin 300 mg twice a day.  4.  Infection prophylaxis: His Bactrim was discontinued.  He will continue acyclovir 400 mg twice daily.  5.  Hypertension: He is continuing metoprolol XL 50 mg daily.

## 2018-04-18 NOTE — Patient Instructions (Signed)
[facility] at [facility] Discharge Instructions  Received Xgeva injection today. Follow-up as scheduled. Call clinic for any questions or concerns   Thank you for choosing [facility] at [facility] to provide your oncology and hematology care.  To afford each patient quality time with our provider, please arrive at least 15 minutes before your scheduled appointment time.   If you have a lab appointment with the Cancer Center please come in thru the  Main Entrance and check in at the main information desk  You need to re-schedule your appointment should you arrive 10 or more minutes late.  We strive to give you quality time with our providers, and arriving late affects you and other patients whose appointments are after yours.  Also, if you no show three or more times for appointments you may be dismissed from the clinic at the providers discretion.     Again, thank you for choosing [facility].  Our hope is that these requests will decrease the amount of time that you wait before being seen by our physicians.       _____________________________________________________________  Should you have questions after your visit to  Cancer Center, please contact our office at [phone] between the hours of 8:30 a.m. and 4:30 p.m.  Voicemails left after 4:30 p.m. will not be returned until the following business day.  For prescription refill requests, have your pharmacy contact our office.       Resources For Cancer Patients and their Caregivers ? American Cancer Society: Can assist with transportation, wigs, general needs, runs Look Good Feel Better.        1-888-227-6333 ? Cancer Care: Provides financial assistance, online support groups, medication/co-pay assistance.  1-800-813-HOPE (4673) ? [facility] Assists [place] cancer patients and their families through emotional , educational and  financial support.  336-427-4357 ? [place] DSS Where to apply for food stamps, Medicaid and utility assistance. 336-342-1394 ? RCATS: Transportation to medical appointments. 336-347-2287 ? Social Security Administration: May apply for disability if have a Stage IV cancer. 336-342-7796 1-800-772-1213 ? [place] Aging, Disability and Transit Services: Assists with nutrition, care and transit needs. 336-349-2343  Cancer Center Support Programs:   > Cancer Support Group  2nd Tuesday of the month 1pm-2pm, Journey Room   > Creative Journey  3rd Tuesday of the month 1130am-1pm, Journey Room    

## 2018-04-19 LAB — KAPPA/LAMBDA LIGHT CHAINS
KAPPA, LAMDA LIGHT CHAIN RATIO: 4.29 — AB (ref 0.26–1.65)
Kappa free light chain: 10.3 mg/L (ref 3.3–19.4)
Lambda free light chains: 2.4 mg/L — ABNORMAL LOW (ref 5.7–26.3)

## 2018-04-19 LAB — IGG, IGA, IGM
IGG (IMMUNOGLOBIN G), SERUM: 323 mg/dL — AB (ref 700–1600)
IgM (Immunoglobulin M), Srm: 7 mg/dL — ABNORMAL LOW (ref 20–172)

## 2018-04-19 LAB — BETA 2 MICROGLOBULIN, SERUM: BETA 2 MICROGLOBULIN: 2.6 mg/L — AB (ref 0.6–2.4)

## 2018-04-20 ENCOUNTER — Other Ambulatory Visit: Payer: Self-pay

## 2018-04-20 ENCOUNTER — Encounter (HOSPITAL_COMMUNITY): Payer: Self-pay

## 2018-04-20 ENCOUNTER — Encounter (HOSPITAL_COMMUNITY): Payer: Self-pay | Admitting: Emergency Medicine

## 2018-04-20 ENCOUNTER — Emergency Department (HOSPITAL_COMMUNITY): Payer: Medicare Other

## 2018-04-20 ENCOUNTER — Inpatient Hospital Stay (HOSPITAL_COMMUNITY): Payer: Medicare Other

## 2018-04-20 ENCOUNTER — Inpatient Hospital Stay (HOSPITAL_COMMUNITY)
Admission: EM | Admit: 2018-04-20 | Discharge: 2018-04-22 | DRG: 872 | Disposition: A | Discharge status: Home / Self Care | Payer: Medicare Other | Attending: Family Medicine | Admitting: Family Medicine

## 2018-04-20 VITALS — BP 123/69 | HR 63 | Temp 98.0°F | Resp 18

## 2018-04-20 DIAGNOSIS — Z7982 Long term (current) use of aspirin: Secondary | ICD-10-CM

## 2018-04-20 DIAGNOSIS — I129 Hypertensive chronic kidney disease with stage 1 through stage 4 chronic kidney disease, or unspecified chronic kidney disease: Secondary | ICD-10-CM | POA: Diagnosis present

## 2018-04-20 DIAGNOSIS — R000 Tachycardia, unspecified: Secondary | ICD-10-CM | POA: Diagnosis not present

## 2018-04-20 DIAGNOSIS — C9000 Multiple myeloma not having achieved remission: Secondary | ICD-10-CM | POA: Diagnosis present

## 2018-04-20 DIAGNOSIS — R509 Fever, unspecified: Secondary | ICD-10-CM

## 2018-04-20 DIAGNOSIS — C50929 Malignant neoplasm of unspecified site of unspecified male breast: Secondary | ICD-10-CM | POA: Diagnosis present

## 2018-04-20 DIAGNOSIS — Z5111 Encounter for antineoplastic chemotherapy: Secondary | ICD-10-CM | POA: Diagnosis not present

## 2018-04-20 DIAGNOSIS — Z79899 Other long term (current) drug therapy: Secondary | ICD-10-CM

## 2018-04-20 DIAGNOSIS — Z87891 Personal history of nicotine dependence: Secondary | ICD-10-CM

## 2018-04-20 DIAGNOSIS — R402413 Glasgow coma scale score 13-15, at hospital admission: Secondary | ICD-10-CM | POA: Diagnosis present

## 2018-04-20 DIAGNOSIS — I10 Essential (primary) hypertension: Secondary | ICD-10-CM

## 2018-04-20 DIAGNOSIS — Z923 Personal history of irradiation: Secondary | ICD-10-CM

## 2018-04-20 DIAGNOSIS — N179 Acute kidney failure, unspecified: Secondary | ICD-10-CM | POA: Diagnosis present

## 2018-04-20 DIAGNOSIS — K219 Gastro-esophageal reflux disease without esophagitis: Secondary | ICD-10-CM | POA: Diagnosis present

## 2018-04-20 DIAGNOSIS — F419 Anxiety disorder, unspecified: Secondary | ICD-10-CM | POA: Diagnosis present

## 2018-04-20 DIAGNOSIS — A419 Sepsis, unspecified organism: Principal | ICD-10-CM | POA: Diagnosis present

## 2018-04-20 DIAGNOSIS — Z9221 Personal history of antineoplastic chemotherapy: Secondary | ICD-10-CM

## 2018-04-20 DIAGNOSIS — R531 Weakness: Secondary | ICD-10-CM | POA: Diagnosis not present

## 2018-04-20 DIAGNOSIS — J069 Acute upper respiratory infection, unspecified: Secondary | ICD-10-CM | POA: Diagnosis present

## 2018-04-20 DIAGNOSIS — D631 Anemia in chronic kidney disease: Secondary | ICD-10-CM | POA: Diagnosis present

## 2018-04-20 DIAGNOSIS — G629 Polyneuropathy, unspecified: Secondary | ICD-10-CM | POA: Diagnosis present

## 2018-04-20 DIAGNOSIS — C7951 Secondary malignant neoplasm of bone: Secondary | ICD-10-CM | POA: Diagnosis present

## 2018-04-20 DIAGNOSIS — H3530 Unspecified macular degeneration: Secondary | ICD-10-CM | POA: Diagnosis present

## 2018-04-20 DIAGNOSIS — J40 Bronchitis, not specified as acute or chronic: Secondary | ICD-10-CM | POA: Diagnosis present

## 2018-04-20 DIAGNOSIS — N183 Chronic kidney disease, stage 3 (moderate): Secondary | ICD-10-CM | POA: Diagnosis present

## 2018-04-20 LAB — COMPREHENSIVE METABOLIC PANEL
ALBUMIN: 3.6 g/dL (ref 3.5–5.0)
ALT: 13 U/L — ABNORMAL LOW (ref 17–63)
ANION GAP: 13 (ref 5–15)
AST: 22 U/L (ref 15–41)
Alkaline Phosphatase: 27 U/L — ABNORMAL LOW (ref 38–126)
BUN: 18 mg/dL (ref 6–20)
CALCIUM: 9 mg/dL (ref 8.9–10.3)
CO2: 22 mmol/L (ref 22–32)
Chloride: 107 mmol/L (ref 101–111)
Creatinine, Ser: 1.51 mg/dL — ABNORMAL HIGH (ref 0.61–1.24)
GFR calc non Af Amer: 45 mL/min — ABNORMAL LOW (ref >60)
GFR, EST AFRICAN AMERICAN: 52 mL/min — AB (ref >60)
GLUCOSE: 132 mg/dL — AB (ref 65–99)
POTASSIUM: 3.6 mmol/L (ref 3.5–5.1)
SODIUM: 142 mmol/L (ref 135–145)
Total Bilirubin: 0.9 mg/dL (ref 0.3–1.2)
Total Protein: 5.6 g/dL — ABNORMAL LOW (ref 6.5–8.1)

## 2018-04-20 LAB — CBC WITH DIFFERENTIAL/PLATELET
BASOS ABS: 0 10*3/uL (ref 0.0–0.1)
BASOS PCT: 0 %
Eosinophils Absolute: 0.1 10*3/uL (ref 0.0–0.7)
Eosinophils Relative: 1 %
HEMATOCRIT: 24.6 % — AB (ref 39.0–52.0)
HEMOGLOBIN: 8 g/dL — AB (ref 13.0–17.0)
LYMPHS PCT: 4 %
Lymphs Abs: 0.7 10*3/uL (ref 0.7–4.0)
MCH: 35.2 pg — ABNORMAL HIGH (ref 26.0–34.0)
MCHC: 32.5 g/dL (ref 30.0–36.0)
MCV: 108.4 fL — AB (ref 78.0–100.0)
MONO ABS: 0.4 10*3/uL (ref 0.1–1.0)
MONOS PCT: 2 %
NEUTROS ABS: 14.4 10*3/uL (ref 1.7–7.7)
NEUTROS PCT: 93 %
Platelets: 133 10*3/uL — ABNORMAL LOW (ref 150–400)
RBC: 2.27 MIL/uL — ABNORMAL LOW (ref 4.22–5.81)
RDW: 15.7 % — AB (ref 11.5–15.5)
WBC: 15.6 10*3/uL — ABNORMAL HIGH (ref 4.0–10.5)

## 2018-04-20 LAB — PROTEIN ELECTROPHORESIS, SERUM
A/G RATIO SPE: 1.7 (ref 0.7–1.7)
ALPHA-1-GLOBULIN: 0.3 g/dL (ref 0.0–0.4)
ALPHA-2-GLOBULIN: 0.5 g/dL (ref 0.4–1.0)
Albumin ELP: 3.4 g/dL (ref 2.9–4.4)
Beta Globulin: 0.8 g/dL (ref 0.7–1.3)
GLOBULIN, TOTAL: 2 g/dL — AB (ref 2.2–3.9)
Gamma Globulin: 0.3 g/dL — ABNORMAL LOW (ref 0.4–1.8)
M-Spike, %: 0.2 g/dL — ABNORMAL HIGH
Total Protein ELP: 5.4 g/dL — ABNORMAL LOW (ref 6.0–8.5)

## 2018-04-20 LAB — IMMUNOFIXATION ELECTROPHORESIS
IgG (Immunoglobin G), Serum: 333 mg/dL — ABNORMAL LOW (ref 700–1600)
IgM (Immunoglobulin M), Srm: 12 mg/dL — ABNORMAL LOW (ref 20–172)
Total Protein ELP: 5.2 g/dL — ABNORMAL LOW (ref 6.0–8.5)

## 2018-04-20 LAB — I-STAT CG4 LACTIC ACID, ED: LACTIC ACID, VENOUS: 2.8 mmol/L — AB (ref 0.5–1.9)

## 2018-04-20 MED ORDER — PROCHLORPERAZINE MALEATE 10 MG PO TABS
ORAL_TABLET | ORAL | Status: AC
  Filled 2018-04-20: qty 1

## 2018-04-20 MED ORDER — SODIUM CHLORIDE 0.9 % IV BOLUS
1000.0000 mL | Freq: Once | INTRAVENOUS | Status: AC
  Administered 2018-04-20: 1000 mL via INTRAVENOUS

## 2018-04-20 MED ORDER — DEXTROSE 5 % IV SOLN
150.0000 mg | Freq: Once | INTRAVENOUS | Status: AC
  Administered 2018-04-20: 150 mg via INTRAVENOUS
  Filled 2018-04-20: qty 60

## 2018-04-20 MED ORDER — SODIUM CHLORIDE 0.9 % IV SOLN
Freq: Once | INTRAVENOUS | Status: AC
  Administered 2018-04-20: 11:00:00 via INTRAVENOUS

## 2018-04-20 MED ORDER — PROCHLORPERAZINE MALEATE 10 MG PO TABS
10.0000 mg | ORAL_TABLET | Freq: Once | ORAL | Status: AC
  Administered 2018-04-20: 10 mg via ORAL

## 2018-04-20 MED ORDER — IBUPROFEN 400 MG PO TABS
600.0000 mg | ORAL_TABLET | Freq: Once | ORAL | Status: AC
  Administered 2018-04-20: 600 mg via ORAL
  Filled 2018-04-20: qty 2

## 2018-04-20 MED ORDER — ONDANSETRON HCL 4 MG/2ML IJ SOLN
4.0000 mg | Freq: Once | INTRAMUSCULAR | Status: AC
  Administered 2018-04-20: 4 mg via INTRAVENOUS
  Filled 2018-04-20: qty 2

## 2018-04-20 MED ORDER — VANCOMYCIN HCL IN DEXTROSE 750-5 MG/150ML-% IV SOLN
750.0000 mg | Freq: Two times a day (BID) | INTRAVENOUS | Status: DC
  Filled 2018-04-20 (×3): qty 150

## 2018-04-20 MED ORDER — PIPERACILLIN-TAZOBACTAM 3.375 G IVPB
3.3750 g | Freq: Three times a day (TID) | INTRAVENOUS | Status: DC
  Administered 2018-04-21 – 2018-04-22 (×3): 3.375 g via INTRAVENOUS
  Filled 2018-04-20 (×3): qty 50

## 2018-04-20 MED ORDER — VANCOMYCIN HCL IN DEXTROSE 1-5 GM/200ML-% IV SOLN
1000.0000 mg | Freq: Once | INTRAVENOUS | Status: AC
  Administered 2018-04-20: 1000 mg via INTRAVENOUS
  Filled 2018-04-20: qty 200

## 2018-04-20 MED ORDER — PIPERACILLIN-TAZOBACTAM 3.375 G IVPB 30 MIN
3.3750 g | Freq: Once | INTRAVENOUS | Status: AC
  Administered 2018-04-20: 3.375 g via INTRAVENOUS
  Filled 2018-04-20: qty 50

## 2018-04-20 MED ORDER — VANCOMYCIN HCL IN DEXTROSE 1-5 GM/200ML-% IV SOLN: 1000.0000 mg | Freq: Once | INTRAVENOUS | Status: DC

## 2018-04-20 NOTE — Progress Notes (Signed)
Tolerated infusion w/o adverse reaction.  Alert, in no distress.  VSS.  Discharged ambulatory in c/o spouse.  

## 2018-04-20 NOTE — Progress Notes (Signed)
Pharmacy Antibiotic Note  Johnny Lucas is a 72 y.o. male admitted on 04/20/2018 with sepsis.  Pharmacy has been consulted for Vancomycin and Zosyn dosing.  Plan: Vancomcyin 2000 mg IV x 1 dose Vancomycin 750 mg IV every 12 hours.  Goal trough 15-20 mcg/mL. Zosyn 3.375g IV q8h (4 hour infusion).  Monitor labs, c/s, and vanco trough as indicated  Height: 5\' 8"  (172.7 cm) Weight: 200 lb (90.7 kg) IBW/kg (Calculated) : 68.4  Temp (24hrs), Avg:99.2 F (37.3 C), Min:97.9 F (36.6 C), Max:100.5 F (38.1 C)  Recent Labs  Lab 04/18/18 0822 04/20/18 2127  WBC 3.7*  --   CREATININE 1.26*  --   LATICACIDVEN  --  2.80*    Estimated Creatinine Clearance: 59.6 mL/min (A) (by C-G formula based on SCr of 1.26 mg/dL (H)).    No Known Allergies  Antimicrobials this admission: Vanco 4/24 >>  Zosyn 4/24 >>   Dose adjustments this admission: N/A  Microbiology results: 4/24 BCx: pending 4/24 UCx: pending    Thank you for allowing pharmacy to be a part of this patient's care.  Ramond Craver 04/20/2018 10:08 PM

## 2018-04-20 NOTE — ED Triage Notes (Signed)
Pt c/o fever that started this evening. He had chemo treatment today. Pt states he became weak and not feeling well since 1400. Pt states he took 1 tylenol at home at 1730.

## 2018-04-20 NOTE — ED Provider Notes (Signed)
 Emergency Department Provider Note   I have reviewed the triage vital signs and the nursing notes.   HISTORY  Chief Complaint Fever   HPI Johnny Lucas is a 72 y.o. male with PMH of MM, HTN, GERD currently on chemotherapy presents to the emergency department with generalized weakness and fever.  She had an infusion (Carfilzomib) this AM and was feeling well after the infusion.  He returned home where he began to feel generally weak and developed a fever.  He denies any shortness of breath, chest pain, abdominal pain.  He continues to have nausea which is not new for him.  Family gave Tylenol prior to arrival.  He is taking his Compazine for nausea.  No diarrhea. No radiation of symptoms or modifying factors.    Past Medical History:  Diagnosis Date  . Anxiety   . Bone metastases ([name] 09/10/2016  . Breast cancer ([name] 2011   Stave IV breast cancer; radiation and tamoxifen  . Breast cancer, male ([name]    Stave IV breast cancer; radiation and tamoxifen  Overview:  Left breast ca with mets bone  Overview:  METS TO BONE  . GERD (gastroesophageal reflux disease)   . Hypertension   . Macular degeneration   . Multiple myeloma ([name] 02/18/2017    Patient Active Problem List   Diagnosis Date Noted  . Essential hypertension 04/21/2018  . Sepsis ([name] 04/21/2018  . Multiple myeloma not having achieved remission ([name] 06/29/2017  . Multiple myeloma ([name] 02/18/2017  . 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 Vasculogenic erectile dysfunction 01/06/2017  . Arthritis 09/10/2016  . Retinopathy 09/10/2016  . Bone metastases ([name] 09/10/2016  . Use of tamoxifen (Nolvadex) 10/25/2015  . Breast cancer, male ([name]   . TIA (transient ischemic attack) 11/12/2013  . GERD 10/07/2010  . CONSTIPATION 10/07/2010  . RECTAL BLEEDING 10/07/2010    Past Surgical History:  Procedure Laterality Date  . BACK SURGERY    . HERNIA REPAIR    . right arm surgery      Current Outpatient Rx  . Order #:  [id number]: Historical Med  . Order #: [id number]: Normal  . Order #: [id number]: Normal  . Order #: [id number]: Historical Med  . Order #: [id number]: Normal  . Order #: [id number]: Historical Med  . Order #: [id number]: Historical Med  . Order #: [id number]: Historical Med  . Order #: [id number]: Historical Med  . Order #: [id number]: Normal  . Order #: [id number]: Historical Med  . Order #: [id number]: Historical Med  . Order #: [id number]: Historical Med  . Order #: [id number]: Historical Med  . Order #: [id number]: Normal  . Order #: [id number]: Historical Med  . Order #: [id number]: Normal  . Order #: [id number]: Normal    Allergies Patient has no known allergies.  Family History  Problem Relation Age of Onset  . Stroke Mother   . Cancer Maternal Aunt        cancer NOS; died in her 50s  . Lung cancer Maternal Uncle        smoker    Social History Social History   Tobacco Use  . Smoking status: Never Smoker  . Smokeless tobacco: Former User  Substance Use Topics  . Alcohol use: Yes    Alcohol/week: 14.4 oz    Types: 24 Cans of beer per week  . Drug use: No    Review of Systems  Constitutional: Positive fever/chills. Positive generalized weakness.  Eyes: No visual changes. ENT: No   sore throat. Cardiovascular: Denies chest pain. Respiratory: Denies shortness of breath. Gastrointestinal: No abdominal pain.  No nausea, no vomiting.  No diarrhea.  No constipation. Genitourinary: Negative for dysuria. Musculoskeletal: Negative for back pain. Skin: Negative for rash. Neurological: Negative for headaches, focal weakness or numbness.  10-point ROS otherwise negative.  ____________________________________________   PHYSICAL EXAM:  VITAL SIGNS: ED Triage Vitals  Enc Vitals Group     BP 04/20/18 1918 (!) 150/71     Pulse Rate 04/20/18 1918 (!) 101     Resp 04/20/18 1918 16     Temp  04/20/18 1918 (!) 100.5 F (38.1 C)     Temp Source 04/20/18 2026 Oral     SpO2 04/20/18 1918 94 %     Weight 04/20/18 1919 200 lb (90.7 kg)     Height 04/20/18 1919 5' 8" (1.727 m)     Pain Score 04/20/18 1919 0   Constitutional: Alert and oriented. Well appearing and in no acute distress. Eyes: Conjunctivae are normal.  Head: Atraumatic. Nose: No congestion/rhinnorhea. Mouth/Throat: Mucous membranes are slightly dry.  Neck: No stridor.  Cardiovascular: Tachycardia. Good peripheral circulation. Grossly normal heart sounds.   Respiratory: Normal respiratory effort.  No retractions. Lungs CTAB. Gastrointestinal: Soft and nontender. No distention.  Musculoskeletal: No lower extremity tenderness nor edema. No gross deformities of extremities. Neurologic:  Normal speech and language. No gross focal neurologic deficits are appreciated.  Skin:  Skin is warm, dry and intact. No rash noted.  ____________________________________________   LABS (all labs ordered are listed, but only abnormal results are displayed)  Labs Reviewed  COMPREHENSIVE METABOLIC PANEL - Abnormal; Notable for the following components:      Result Value   Glucose, Bld 132 (*)    Creatinine, Ser 1.51 (*)    Total Protein 5.6 (*)    ALT 13 (*)    Alkaline Phosphatase 27 (*)    GFR calc non Af Amer 45 (*)    GFR calc Af Amer 52 (*)    All other components within normal limits  CBC WITH DIFFERENTIAL/PLATELET - Abnormal; Notable for the following components:   WBC 15.6 (*)    RBC 2.27 (*)    Hemoglobin 8.0 (*)    HCT 24.6 (*)    MCV 108.4 (*)    MCH 35.2 (*)    RDW 15.7 (*)    Platelets 133 (*)    All other components within normal limits  I-STAT CG4 LACTIC ACID, ED - Abnormal; Notable for the following components:   Lactic Acid, Venous 2.80 (*)    All other components within normal limits  CULTURE, BLOOD (ROUTINE X 2)  CULTURE, BLOOD (ROUTINE X 2)  URINE CULTURE  URINALYSIS, ROUTINE W REFLEX MICROSCOPIC    CBC  BASIC METABOLIC PANEL  LACTIC ACID, PLASMA  I-STAT CG4 LACTIC ACID, ED   ____________________________________________  EKG   EKG Interpretation  Date/Time:  Wednesday April 20 2018 21:02:41 EDT Ventricular Rate:  103 PR Interval:    QRS Duration: 89 QT Interval:  348 QTC Calculation: 456 R Axis:   96 Text Interpretation:  Sinus tachycardia Probable right ventricular hypertrophy No STEMI.  Confirmed by Nanda Quinton [phone]) on 04/20/2018 9:35:18 PM       ____________________________________________  RADIOLOGY  Dg Chest 2 View  Result Date: 04/20/2018 CLINICAL DATA:  Fever and not feeling well. History of breast cancer, multiple myeloma, bone metastases, chemotherapy today. EXAM: CHEST - 2 VIEW COMPARISON:  None. FINDINGS: Shallow inspiration. Heart size  and pulmonary vascularity are normal for technique. Lungs are clear and expanded. No airspace disease or consolidation. No blunting of costophrenic angles. No pneumothorax. Mediastinal contours appear intact. Degenerative changes in the spine. Heterogeneous sclerosis in the vertebrae may correspond to known metastatic disease. Old healed fracture deformity of the right clavicle. Postoperative intramedullary rod fixation of the right humerus. Multiple old right rib fractures. IMPRESSION: No evidence of active pulmonary disease. Multiple old fracture deformities. Suggestion of heterogeneous sclerosis in some of the thoracic vertebra possibly corresponding to history of metastatic disease. Electronically Signed   By: William  Stevens M.D.   On: 04/20/2018 22:26    ____________________________________________   PROCEDURES  Procedure(s) performed:   .Critical Care Performed by: Long, Joshua G, MD Authorized by: Long, Joshua G, MD   Critical care provider statement:    Critical care time (minutes):  35   Critical care time was exclusive of:  Separately billable procedures and treating other patients and teaching time    Critical care was necessary to treat or prevent imminent or life-threatening deterioration of the following conditions:  Sepsis and dehydration   Critical care was time spent personally by me on the following activities:  Blood draw for specimens, development of treatment plan with patient or surrogate, discussions with consultants, evaluation of patient's response to treatment, examination of patient, obtaining history from patient or surrogate, ordering and performing treatments and interventions, ordering and review of laboratory studies, ordering and review of radiographic studies, pulse oximetry, re-evaluation of patient's condition and review of old charts   I assumed direction of critical care for this patient from another provider in my specialty: no       ____________________________________________   INITIAL IMPRESSION / ASSESSMENT AND PLAN / ED COURSE  Pertinent labs & imaging results that were available during my care of the patient were reviewed by me and considered in my medical decision making (see chart for details).  Patient presents to the emergency department for evaluation of fever.  He received chemotherapy today with no acute reaction.  The dose was larger than is typical for him.  Also status post stem cell transplant.  On arrival to the emergency department the patient has elevated heart rate with fever.  No clear source of infection at this time.  Plan for sepsis labs and discussion with the patient's oncologist.  Case discussed with the patient's oncologist, Dr. Katragadda who agrees with plan for abx and obs admit. Labs and imaging reviewed with no clear source of infection. Vanc/Zosyn ordered with unknown infection. Oncology to follow in consultation. Updated patient and family at bedside regarding labs and plan.   Discussed patient's case with Hospitalist, Dr. Shah to request admission. Patient and family (if present) updated with plan. Care transferred to Hospitalist  service.  I reviewed all nursing notes, vitals, pertinent old records, EKGs, labs, imaging (as available).  ____________________________________________  FINAL CLINICAL IMPRESSION(S) / ED DIAGNOSES  Final diagnoses:  Febrile illness  Patient on antineoplastic chemotherapy regimen     MEDICATIONS GIVEN DURING THIS VISIT:  Medications  piperacillin-tazobactam (ZOSYN) IVPB 3.375 g (has no administration in time range)  vancomycin (VANCOCIN) IVPB 1000 mg/200 mL premix (1,000 mg Intravenous Not Given 04/20/18 2255)  vancomycin (VANCOCIN) IVPB 750 mg/150 ml premix (has no administration in time range)  acyclovir (ZOVIRAX) tablet 400 mg (has no administration in time range)  ALPRAZolam (XANAX) tablet 0.5 mg (has no administration in time range)  aspirin EC tablet 162 mg (has no administration in time range)    calcium-vitamin D (OSCAL WITH D) 500-200 MG-UNIT per tablet 2 tablet (has no administration in time range)  folic acid (FOLVITE) tablet 1 mg (has no administration in time range)  gabapentin (NEURONTIN) capsule 300 mg (has no administration in time range)  guaiFENesin (MUCINEX) 12 hr tablet 600 mg (has no administration in time range)  loratadine (CLARITIN) tablet 10 mg (has no administration in time range)  multivitamin with minerals tablet 1 tablet (has no administration in time range)  pantoprazole (PROTONIX) EC tablet 40 mg (has no administration in time range)  prochlorperazine (COMPAZINE) tablet 10 mg (has no administration in time range)  tamoxifen (NOLVADEX) tablet 20 mg (has no administration in time range)  lactated ringers infusion (has no administration in time range)  acetaminophen (TYLENOL) tablet 650 mg (has no administration in time range)    Or  acetaminophen (TYLENOL) suppository 650 mg (has no administration in time range)  ondansetron (ZOFRAN) tablet 4 mg (has no administration in time range)    Or  ondansetron (ZOFRAN) injection 4 mg (has no administration in  time range)  sodium chloride 0.9 % bolus 1,000 mL (has no administration in time range)  sodium chloride 0.9 % bolus 1,000 mL (0 mLs Intravenous Stopped 04/20/18 2254)  ibuprofen (ADVIL,MOTRIN) tablet 600 mg (600 mg Oral Given 04/20/18 2137)  ondansetron (ZOFRAN) injection 4 mg (4 mg Intravenous Given 04/20/18 2137)  piperacillin-tazobactam (ZOSYN) IVPB 3.375 g (0 g Intravenous Stopped 04/20/18 2213)  vancomycin (VANCOCIN) IVPB 1000 mg/200 mL premix (0 mg Intravenous Stopped 04/20/18 2254)    Note:  This document was prepared using Dragon voice recognition software and may include unintentional dictation errors.  Nanda Quinton, MD Emergency Medicine    Long, Wonda Old[employer], MD 04/21/18 [phone]

## 2018-04-20 NOTE — ED Notes (Signed)
Pt took tyl at 530pm

## 2018-04-21 DIAGNOSIS — Z923 Personal history of irradiation: Secondary | ICD-10-CM | POA: Diagnosis not present

## 2018-04-21 DIAGNOSIS — A419 Sepsis, unspecified organism: Secondary | ICD-10-CM | POA: Diagnosis not present

## 2018-04-21 DIAGNOSIS — R402413 Glasgow coma scale score 13-15, at hospital admission: Secondary | ICD-10-CM | POA: Diagnosis present

## 2018-04-21 DIAGNOSIS — F419 Anxiety disorder, unspecified: Secondary | ICD-10-CM | POA: Diagnosis present

## 2018-04-21 DIAGNOSIS — Z7982 Long term (current) use of aspirin: Secondary | ICD-10-CM | POA: Diagnosis not present

## 2018-04-21 DIAGNOSIS — G629 Polyneuropathy, unspecified: Secondary | ICD-10-CM | POA: Diagnosis present

## 2018-04-21 DIAGNOSIS — D631 Anemia in chronic kidney disease: Secondary | ICD-10-CM | POA: Diagnosis present

## 2018-04-21 DIAGNOSIS — K219 Gastro-esophageal reflux disease without esophagitis: Secondary | ICD-10-CM | POA: Diagnosis not present

## 2018-04-21 DIAGNOSIS — Z79899 Other long term (current) drug therapy: Secondary | ICD-10-CM | POA: Diagnosis not present

## 2018-04-21 DIAGNOSIS — Z9221 Personal history of antineoplastic chemotherapy: Secondary | ICD-10-CM | POA: Diagnosis not present

## 2018-04-21 DIAGNOSIS — N179 Acute kidney failure, unspecified: Secondary | ICD-10-CM | POA: Diagnosis present

## 2018-04-21 DIAGNOSIS — Z87891 Personal history of nicotine dependence: Secondary | ICD-10-CM | POA: Diagnosis not present

## 2018-04-21 DIAGNOSIS — C7951 Secondary malignant neoplasm of bone: Secondary | ICD-10-CM | POA: Diagnosis present

## 2018-04-21 DIAGNOSIS — I10 Essential (primary) hypertension: Secondary | ICD-10-CM

## 2018-04-21 DIAGNOSIS — I129 Hypertensive chronic kidney disease with stage 1 through stage 4 chronic kidney disease, or unspecified chronic kidney disease: Secondary | ICD-10-CM | POA: Diagnosis present

## 2018-04-21 DIAGNOSIS — C50929 Malignant neoplasm of unspecified site of unspecified male breast: Secondary | ICD-10-CM | POA: Diagnosis present

## 2018-04-21 DIAGNOSIS — H3530 Unspecified macular degeneration: Secondary | ICD-10-CM | POA: Diagnosis present

## 2018-04-21 DIAGNOSIS — C50021 Malignant neoplasm of nipple and areola, right male breast: Secondary | ICD-10-CM | POA: Diagnosis not present

## 2018-04-21 DIAGNOSIS — J069 Acute upper respiratory infection, unspecified: Secondary | ICD-10-CM | POA: Diagnosis present

## 2018-04-21 DIAGNOSIS — N183 Chronic kidney disease, stage 3 (moderate): Secondary | ICD-10-CM | POA: Diagnosis present

## 2018-04-21 DIAGNOSIS — C9000 Multiple myeloma not having achieved remission: Secondary | ICD-10-CM | POA: Diagnosis present

## 2018-04-21 DIAGNOSIS — C9001 Multiple myeloma in remission: Secondary | ICD-10-CM | POA: Diagnosis not present

## 2018-04-21 DIAGNOSIS — J40 Bronchitis, not specified as acute or chronic: Secondary | ICD-10-CM | POA: Diagnosis present

## 2018-04-21 LAB — URINALYSIS, ROUTINE W REFLEX MICROSCOPIC
BACTERIA UA: NONE SEEN
Bilirubin Urine: NEGATIVE
GLUCOSE, UA: NEGATIVE mg/dL
Hgb urine dipstick: NEGATIVE
KETONES UR: NEGATIVE mg/dL
Nitrite: NEGATIVE
PROTEIN: NEGATIVE mg/dL
Specific Gravity, Urine: 1.018 (ref 1.005–1.030)
pH: 5 (ref 5.0–8.0)

## 2018-04-21 LAB — CBC
HCT: 20.6 % — ABNORMAL LOW (ref 39.0–52.0)
HEMOGLOBIN: 6.8 g/dL — AB (ref 13.0–17.0)
MCH: 35.4 pg — AB (ref 26.0–34.0)
MCHC: 33 g/dL (ref 30.0–36.0)
MCV: 107.3 fL — AB (ref 78.0–100.0)
PLATELETS: 120 10*3/uL — AB (ref 150–400)
RBC: 1.92 MIL/uL — ABNORMAL LOW (ref 4.22–5.81)
RDW: 16.1 % — ABNORMAL HIGH (ref 11.5–15.5)
WBC: 17 10*3/uL — AB (ref 4.0–10.5)

## 2018-04-21 LAB — BASIC METABOLIC PANEL
ANION GAP: 12 (ref 5–15)
BUN: 22 mg/dL — ABNORMAL HIGH (ref 6–20)
CHLORIDE: 108 mmol/L (ref 101–111)
CO2: 19 mmol/L — ABNORMAL LOW (ref 22–32)
Calcium: 8 mg/dL — ABNORMAL LOW (ref 8.9–10.3)
Creatinine, Ser: 2.41 mg/dL — ABNORMAL HIGH (ref 0.61–1.24)
GFR calc Af Amer: 30 mL/min — ABNORMAL LOW (ref >60)
GFR, EST NON AFRICAN AMERICAN: 26 mL/min — AB (ref >60)
GLUCOSE: 115 mg/dL — AB (ref 65–99)
POTASSIUM: 3.9 mmol/L (ref 3.5–5.1)
SODIUM: 139 mmol/L (ref 135–145)

## 2018-04-21 LAB — MRSA PCR SCREENING: MRSA by PCR: NEGATIVE

## 2018-04-21 LAB — LACTIC ACID, PLASMA: LACTIC ACID, VENOUS: 2.2 mmol/L — AB (ref 0.5–1.9)

## 2018-04-21 LAB — PREPARE RBC (CROSSMATCH)

## 2018-04-21 LAB — ABO/RH: ABO/RH(D): O POS

## 2018-04-21 MED ORDER — TAMOXIFEN CITRATE 10 MG PO TABS
20.0000 mg | ORAL_TABLET | Freq: Every day | ORAL | Status: DC
  Administered 2018-04-21 – 2018-04-22 (×2): 20 mg via ORAL
  Filled 2018-04-21 (×4): qty 2

## 2018-04-21 MED ORDER — ADULT MULTIVITAMIN W/MINERALS CH
1.0000 | ORAL_TABLET | Freq: Every day | ORAL | Status: DC
Start: 2018-04-21 — End: 2018-04-22
  Administered 2018-04-21: 1 via ORAL
  Filled 2018-04-21 (×2): qty 1

## 2018-04-21 MED ORDER — SODIUM CHLORIDE 0.9 % IV SOLN: Freq: Once | INTRAVENOUS | Status: DC

## 2018-04-21 MED ORDER — ONDANSETRON HCL 4 MG/2ML IJ SOLN: 4.0000 mg | Freq: Four times a day (QID) | INTRAMUSCULAR | Status: DC | PRN

## 2018-04-21 MED ORDER — SODIUM CHLORIDE 0.9 % IV BOLUS
1000.0000 mL | Freq: Once | INTRAVENOUS | Status: AC
  Administered 2018-04-21: 1000 mL via INTRAVENOUS

## 2018-04-21 MED ORDER — LACTATED RINGERS IV SOLN
INTRAVENOUS | Status: AC
  Administered 2018-04-21: 03:00:00 via INTRAVENOUS

## 2018-04-21 MED ORDER — ALPRAZOLAM 0.5 MG PO TABS
0.5000 mg | ORAL_TABLET | Freq: Every day | ORAL | Status: DC
  Administered 2018-04-21 (×2): 0.5 mg via ORAL
  Filled 2018-04-21 (×2): qty 1

## 2018-04-21 MED ORDER — ASPIRIN EC 81 MG PO TBEC
162.0000 mg | DELAYED_RELEASE_TABLET | Freq: Every day | ORAL | Status: DC
  Administered 2018-04-21 – 2018-04-22 (×2): 162 mg via ORAL
  Filled 2018-04-21 (×2): qty 2

## 2018-04-21 MED ORDER — ACYCLOVIR 800 MG PO TABS
400.0000 mg | ORAL_TABLET | Freq: Two times a day (BID) | ORAL | Status: DC
  Administered 2018-04-21 – 2018-04-22 (×4): 400 mg via ORAL
  Filled 2018-04-21 (×4): qty 1

## 2018-04-21 MED ORDER — FOLIC ACID 1 MG PO TABS
1.0000 mg | ORAL_TABLET | Freq: Every day | ORAL | Status: DC
  Administered 2018-04-21 – 2018-04-22 (×2): 1 mg via ORAL
  Filled 2018-04-21 (×2): qty 1

## 2018-04-21 MED ORDER — GABAPENTIN 300 MG PO CAPS
300.0000 mg | ORAL_CAPSULE | Freq: Two times a day (BID) | ORAL | Status: DC
  Administered 2018-04-21 – 2018-04-22 (×4): 300 mg via ORAL
  Filled 2018-04-21 (×4): qty 1

## 2018-04-21 MED ORDER — CALCIUM CARBONATE-VITAMIN D 500-200 MG-UNIT PO TABS
2.0000 | ORAL_TABLET | Freq: Two times a day (BID) | ORAL | Status: DC
  Administered 2018-04-21 – 2018-04-22 (×3): 2 via ORAL
  Filled 2018-04-21 (×6): qty 2

## 2018-04-21 MED ORDER — ACETAMINOPHEN 650 MG RE SUPP: 650.0000 mg | Freq: Four times a day (QID) | RECTAL | Status: DC | PRN

## 2018-04-21 MED ORDER — ONDANSETRON HCL 4 MG PO TABS: 4.0000 mg | ORAL_TABLET | Freq: Four times a day (QID) | ORAL | Status: DC | PRN

## 2018-04-21 MED ORDER — ACETAMINOPHEN 325 MG PO TABS: 650.0000 mg | ORAL_TABLET | Freq: Four times a day (QID) | ORAL | Status: DC | PRN

## 2018-04-21 MED ORDER — SODIUM CHLORIDE 0.9 % IV SOLN
INTRAVENOUS | Status: AC
  Administered 2018-04-21 – 2018-04-22 (×3): via INTRAVENOUS

## 2018-04-21 MED ORDER — LORATADINE 10 MG PO TABS
10.0000 mg | ORAL_TABLET | Freq: Every day | ORAL | Status: DC
  Administered 2018-04-21 – 2018-04-22 (×2): 10 mg via ORAL
  Filled 2018-04-21 (×2): qty 1

## 2018-04-21 MED ORDER — PANTOPRAZOLE SODIUM 40 MG PO TBEC
40.0000 mg | DELAYED_RELEASE_TABLET | Freq: Every evening | ORAL | Status: DC
  Administered 2018-04-21: 40 mg via ORAL
  Filled 2018-04-21: qty 1

## 2018-04-21 MED ORDER — PROCHLORPERAZINE MALEATE 5 MG PO TABS
10.0000 mg | ORAL_TABLET | Freq: Four times a day (QID) | ORAL | Status: DC | PRN
Start: 2018-04-21 — End: 2018-04-22

## 2018-04-21 MED ORDER — GUAIFENESIN ER 600 MG PO TB12
600.0000 mg | ORAL_TABLET | Freq: Two times a day (BID) | ORAL | Status: DC | PRN
Start: 2018-04-21 — End: 2018-04-22
  Administered 2018-04-21 – 2018-04-22 (×2): 600 mg via ORAL
  Filled 2018-04-21 (×4): qty 1

## 2018-04-21 NOTE — ED Notes (Signed)
Patient denies pain and is resting comfortably.  

## 2018-04-21 NOTE — Progress Notes (Signed)
04/20/2018  8:18 PM  04/21/2018 8:04 AM  Johnny Lucas was seen and examined.  The H&P by the admitting provider, orders, imaging was reviewed.  Please see new orders.  Will continue to follow.  Added IV Fluids.    Vitals:   04/21/18 0600 04/21/18 0700  BP: (!) 91/56 (!) 82/53  Pulse: 85 89  Resp: 17 17  Temp:    SpO2: 91% 91%   Results for orders placed or performed during the hospital encounter of 04/20/18  Blood Culture (routine x 2)  Result Value Ref Range   Specimen Description BLOOD RIGHT WRIST    Special Requests      BOTTLES DRAWN AEROBIC AND ANAEROBIC Blood Culture adequate volume   Culture      NO GROWTH < 12 HOURS Performed at [facility] [address], [place], [place] [address]    Report Status PENDING   Blood Culture (routine x 2)  Result Value Ref Range   Specimen Description BLOOD RIGHT FOREARM DRAWN BY RN    Special Requests      BOTTLES DRAWN AEROBIC AND ANAEROBIC Blood Culture adequate volume   Culture      NO GROWTH < 12 HOURS Performed at [facility], [address] [place], [place] [address]    Report Status PENDING   MRSA PCR Screening  Result Value Ref Range   MRSA by PCR NEGATIVE NEGATIVE  Comprehensive metabolic panel  Result Value Ref Range   Sodium 142 135 - 145 mmol/L   Potassium 3.6 3.5 - 5.1 mmol/L   Chloride 107 101 - 111 mmol/L   CO2 22 22 - 32 mmol/L   Glucose, Bld 132 (H) 65 - 99 mg/dL   BUN 18 6 - 20 mg/dL   Creatinine, Ser 1.51 (H) 0.61 - 1.24 mg/dL   Calcium 9.0 8.9 - 10.3 mg/dL   Total Protein 5.6 (L) 6.5 - 8.1 g/dL   Albumin 3.6 3.5 - 5.0 g/dL   AST 22 15 - 41 U/L   ALT 13 (L) 17 - 63 U/L   Alkaline Phosphatase 27 (L) 38 - 126 U/L   Total Bilirubin 0.9 0.3 - 1.2 mg/dL   GFR calc non Af Amer 45 (L) >60 mL/min   GFR calc Af Amer 52 (L) >60 mL/min   Anion gap 13 5 - 15  CBC WITH DIFFERENTIAL  Result Value Ref Range   WBC 15.6 (H) 4.0 - 10.5 K/uL   RBC 2.27 (L) 4.22 - 5.81 MIL/uL   Hemoglobin 8.0 (L) 13.0 - 17.0 g/dL    HCT 24.6 (L) 39.0 - 52.0 %   MCV 108.4 (H) 78.0 - 100.0 fL   MCH 35.2 (H) 26.0 - 34.0 pg   MCHC 32.5 30.0 - 36.0 g/dL   RDW 15.7 (H) 11.5 - 15.5 %   Platelets 133 (L) 150 - 400 K/uL   Neutrophils Relative % 93 %   Neutro Abs 14.4 1.7 - 7.7 K/uL   Lymphocytes Relative 4 %   Lymphs Abs 0.7 0.7 - 4.0 K/uL   Monocytes Relative 2 %   Monocytes Absolute 0.4 0.1 - 1.0 K/uL   Eosinophils Relative 1 %   Eosinophils Absolute 0.1 0.0 - 0.7 K/uL   Basophils Relative 0 %   Basophils Absolute 0.0 0.0 - 0.1 K/uL  Urinalysis, Routine w reflex microscopic  Result Value Ref Range   Color, Urine YELLOW YELLOW   APPearance HAZY (A) CLEAR   Specific Gravity, Urine 1.018 1.005 - 1.030  pH 5.0 5.0 - 8.0   Glucose, UA NEGATIVE NEGATIVE mg/dL   Hgb urine dipstick NEGATIVE NEGATIVE   Bilirubin Urine NEGATIVE NEGATIVE   Ketones, ur NEGATIVE NEGATIVE mg/dL   Protein, ur NEGATIVE NEGATIVE mg/dL   Nitrite NEGATIVE NEGATIVE   Leukocytes, UA TRACE (A) NEGATIVE   RBC / HPF 0-5 0 - 5 RBC/hpf   WBC, UA 6-10 0 - 5 WBC/hpf   Bacteria, UA NONE SEEN NONE SEEN   Hyaline Casts, UA PRESENT   CBC  Result Value Ref Range   WBC 17.0 (H) 4.0 - 10.5 K/uL   RBC 1.92 (L) 4.22 - 5.81 MIL/uL   Hemoglobin 6.8 (LL) 13.0 - 17.0 g/dL   HCT 20.6 (L) 39.0 - 52.0 %   MCV 107.3 (H) 78.0 - 100.0 fL   MCH 35.4 (H) 26.0 - 34.0 pg   MCHC 33.0 30.0 - 36.0 g/dL   RDW 16.1 (H) 11.5 - 15.5 %   Platelets 120 (L) 150 - 400 K/uL  Basic metabolic panel  Result Value Ref Range   Sodium 139 135 - 145 mmol/L   Potassium 3.9 3.5 - 5.1 mmol/L   Chloride 108 101 - 111 mmol/L   CO2 19 (L) 22 - 32 mmol/L   Glucose, Bld 115 (H) 65 - 99 mg/dL   BUN 22 (H) 6 - 20 mg/dL   Creatinine, Ser 2.41 (H) 0.61 - 1.24 mg/dL   Calcium 8.0 (L) 8.9 - 10.3 mg/dL   GFR calc non Af Amer 26 (L) >60 mL/min   GFR calc Af Amer 30 (L) >60 mL/min   Anion gap 12 5 - 15  Lactic acid, plasma  Result Value Ref Range   Lactic Acid, Venous 2.2 (HH) 0.5 - 1.9  mmol/L  I-Stat CG4 Lactic Acid, ED  (not at  [facility])  Result Value Ref Range   Lactic Acid, Venous 2.80 (HH) 0.5 - 1.9 mmol/L  Type and screen [facility]  Result Value Ref Range   ABO/RH(D) PENDING    Antibody Screen PENDING    Sample Expiration      04/24/2018 Performed at [facility], [address] [place], [place] [address]   Prepare RBC  Result Value Ref Range   Order Confirmation      ORDER PROCESSED BY BLOOD BANK Performed at [facility], [address], [place], [place] [address]      C. Wynetta Emery, MD [facility]

## 2018-04-21 NOTE — H&P (Signed)
History and Physical    Johnny Lucas:[id number] DOB: 03/07/1946 DOA: 04/20/2018  PCP: Johnny Percy, MD   Patient coming from: Home  Chief Complaint: Fever and weakness  HPI: Johnny Lucas is a 72 y.o. male with medical history significant for stage II IgG kappa multiple myeloma currently on chemotherapy, metastatic left breast cancer to bones, peripheral neuropathy, hypertension, and GERD who received chemotherapy for his multiple myeloma earlier today and shortly after he arrived home he felt quite weak and was noted to have a fever.  He developed some nausea as well but did not have any vomiting.  He takes Compazine for nausea and his family gave him some Tylenol prior to arrival to the ED.  He denies any radiation of his pain or any modifying factors. He denies any chest pain, shortness of breath cough, abdominal pain, or diarrhea.   ED Course: His vital signs demonstrate some tachycardia and soft blood pressure readings, but he appears overall stable.  He has leukocytosis of 15,600 as well as hemoglobin of 8 and platelets of 133.  These values appear to be stable.  His creatinine is 1.51 where he was noted to be 1.09 recently on 03/28/2018.  His lactic acid level is 2.8.  His chest x-ray demonstrates no acute findings.  It appears that a urine analysis was not collected.  Blood cultures have been collected, however and vancomycin and Zosyn have been initiated.  He has also received a 1 L fluid bolus and Dr. Delton Coombes of oncology was called and will see patient in a.m.  Review of Systems: All others reviewed and otherwise negative.  Past Medical History:  Diagnosis Date  . Anxiety   . Bone metastases ([place]) 09/10/2016  . Breast cancer ([place]) 2011   Stave IV breast cancer; radiation and tamoxifen  . Breast cancer, male ([place])    Stave IV breast cancer; radiation and tamoxifen  Overview:  Left breast ca with mets bone  Overview:  METS TO BONE  . GERD (gastroesophageal reflux disease)     . Hypertension   . Macular degeneration   . Multiple myeloma ([place]) 02/18/2017    Past Surgical History:  Procedure Laterality Date  . BACK SURGERY    . HERNIA REPAIR    . right arm surgery       reports that he has never smoked. He has quit using smokeless tobacco. He reports that he drinks about 14.4 oz of alcohol per week. He reports that he does not use drugs.  No Known Allergies  Family History  Problem Relation Age of Onset  . Stroke Mother   . Cancer Maternal Aunt        cancer NOS; died in her 92s  . Lung cancer Maternal Uncle        smoker    Prior to Admission medications   Medication Sig Start Date End Date Taking? Authorizing Provider  acyclovir (ZOVIRAX) 400 MG tablet Take 400 mg by mouth 2 (two) times daily.   Yes [provider]  albuterol (PROVENTIL HFA;VENTOLIN HFA) 108 (90 Base) MCG/ACT inhaler Inhale 2 puffs into the lungs every 6 (six) hours as needed for wheezing or shortness of breath. 03/08/17  Yes Kefalas, Manon Hilding, PA-C  ALPRAZolam (XANAX) 0.5 MG tablet TAKE ONE TABLET BY MOUTH AT BEDTIME AS NEEDED FOR ANXIETY Patient taking differently: TAKE ONE TABLET BY MOUTH AT BEDTIME 03/14/18  Yes Holley Bouche, NP  aspirin EC 81 MG tablet Take 162 mg by mouth  daily.    Yes [provider]  calcium-vitamin D (OSCAL WITH D) 500-200 MG-UNIT TABS tablet TAKE TWO TABLETS BY MOUTH DAILY Patient taking differently: TAKE TWO TABLETS BY MOUTH  TWICE DAILY 01/04/18  Yes Holley Bouche, NP  Carfilzomib (KYPROLIS IV) Inject into the vein. Day 1 and 2, day 8 and 9, day 15 and 16 every 28 days for 2 months   Yes [provider]  cyclophosphamide (CYTOXAN) 2 g chemo injection Inject into the vein once. Day 1, day 8, day 15, every 28 days for 2 months   Yes [provider]  denosumab (XGEVA) 120 MG/1.7ML SOLN injection Inject 120 mg into the skin once.   Yes [provider]  folic acid (FOLVITE) 1 MG tablet Take 1 mg by mouth  daily.  10/22/17  Yes [provider]  gabapentin (NEURONTIN) 300 MG capsule Take two times a day, then increase to three times a day in a few weeks. Patient taking differently: Take 300 mg by mouth 2 (two) times daily.  01/04/18  Yes Holley Bouche, NP  guaiFENesin (MUCINEX) 600 MG 12 hr tablet Take 600 mg by mouth 2 (two) times daily as needed for cough.   Yes [provider]  loratadine (CLARITIN) 10 MG tablet Take 10 mg by mouth daily.   Yes [provider]  metoprolol succinate (TOPROL-XL) 50 MG 24 hr tablet Take 50 mg by mouth daily.  03/30/17  Yes [provider]  Multiple Vitamin (MULTIVITAMIN WITH MINERALS) TABS tablet Take 1 tablet by mouth daily.   Yes [provider]  ondansetron (ZOFRAN) 8 MG tablet Take 1 tablet (8 mg total) by mouth every 8 (eight) hours as needed for nausea or vomiting. 07/06/17  Yes Kefalas, Manon Hilding, PA-C  pantoprazole (PROTONIX) 40 MG tablet Take 40 mg by mouth every evening.    Yes [provider]  prochlorperazine (COMPAZINE) 10 MG tablet Take 1 tablet (10 mg total) by mouth every 6 (six) hours as needed for nausea or vomiting. 07/06/17  Yes Kefalas, Manon Hilding, PA-C  tamoxifen (NOLVADEX) 20 MG tablet TAKE 1 TABLET EVERY DAY 03/15/18  Yes Holley Bouche, NP    Physical Exam: Vitals:   04/20/18 2324 04/20/18 2330 04/21/18 0000 04/21/18 0015  BP: (!) 105/56 108/60 (!) 107/59   Pulse:  (!) 103  100  Resp:  (!) 21  13  Temp:      TempSrc:      SpO2:  90%  94%  Weight:      Height:        Constitutional: NAD, calm, comfortable Vitals:   04/20/18 2324 04/20/18 2330 04/21/18 0000 04/21/18 0015  BP: (!) 105/56 108/60 (!) 107/59   Pulse:  (!) 103  100  Resp:  (!) 21  13  Temp:      TempSrc:      SpO2:  90%  94%  Weight:      Height:       Eyes: lids and conjunctivae normal ENMT: Mucous membranes are moist.  Neck: normal, supple Respiratory: clear to auscultation bilaterally. Normal respiratory  effort. No accessory muscle use.  Currently on 2 L nasal cannula. Cardiovascular: Regular rate and rhythm, no murmurs. No extremity edema. Abdomen: no tenderness, no distention. Bowel sounds positive.  Musculoskeletal:  No joint deformity upper and lower extremities.   Skin: no rashes, lesions, ulcers.  Psychiatric: Normal judgment and insight. Alert and oriented x 3. Normal mood.   Labs on  Admission: I have personally reviewed following labs and imaging studies  CBC: Recent Labs  Lab 04/18/18 0822 04/20/18 2122  WBC 3.7* 15.6*  NEUTROABS 2.6 14.4  HGB 8.3* 8.0*  HCT 25.4* 24.6*  MCV 106.7* 108.4*  PLT 157 102*   Basic Metabolic Panel: Recent Labs  Lab 04/18/18 0822 04/20/18 2122  NA 138 142  K 3.8 3.6  CL 106 107  CO2 23 22  GLUCOSE 105* 132*  BUN 17 18  CREATININE 1.26* 1.51*  CALCIUM 9.0 9.0   GFR: Estimated Creatinine Clearance: 49.8 mL/min (A) (by C-G formula based on SCr of 1.51 mg/dL (H)). Liver Function Tests: Recent Labs  Lab 04/18/18 0822 04/20/18 2122  AST 20 22  ALT 14* 13*  ALKPHOS 28* 27*  BILITOT 0.8 0.9  PROT 5.7* 5.6*  ALBUMIN 3.7 3.6   No results for input(s): LIPASE, AMYLASE in the last 168 hours. No results for input(s): AMMONIA in the last 168 hours. Coagulation Profile: No results for input(s): INR, PROTIME in the last 168 hours. Cardiac Enzymes: No results for input(s): CKTOTAL, CKMB, CKMBINDEX, TROPONINI in the last 168 hours. BNP (last 3 results) No results for input(s): PROBNP in the last 8760 hours. HbA1C: No results for input(s): HGBA1C in the last 72 hours. CBG: No results for input(s): GLUCAP in the last 168 hours. Lipid Profile: No results for input(s): CHOL, HDL, LDLCALC, TRIG, CHOLHDL, LDLDIRECT in the last 72 hours. Thyroid Function Tests: No results for input(s): TSH, T4TOTAL, FREET4, T3FREE, THYROIDAB in the last 72 hours. Anemia Panel: No results for input(s): VITAMINB12, FOLATE, FERRITIN, TIBC, IRON, RETICCTPCT in  the last 72 hours. Urine analysis: No results found for: COLORURINE, APPEARANCEUR, LABSPEC, PHURINE, GLUCOSEU, HGBUR, BILIRUBINUR, KETONESUR, PROTEINUR, UROBILINOGEN, NITRITE, LEUKOCYTESUR  Radiological Exams on Admission: Dg Chest 2 View  Result Date: 04/20/2018 CLINICAL DATA:  Fever and not feeling well. History of breast cancer, multiple myeloma, bone metastases, chemotherapy today. EXAM: CHEST - 2 VIEW COMPARISON:  None. FINDINGS: Shallow inspiration. Heart size and pulmonary vascularity are normal for technique. Lungs are clear and expanded. No airspace disease or consolidation. No blunting of costophrenic angles. No pneumothorax. Mediastinal contours appear intact. Degenerative changes in the spine. Heterogeneous sclerosis in the vertebrae may correspond to known metastatic disease. Old healed fracture deformity of the right clavicle. Postoperative intramedullary rod fixation of the right humerus. Multiple old right rib fractures. IMPRESSION: No evidence of active pulmonary disease. Multiple old fracture deformities. Suggestion of heterogeneous sclerosis in some of the thoracic vertebra possibly corresponding to history of metastatic disease. Electronically Signed   By: Lucienne Capers M.D.   On: 04/20/2018 22:26    EKG: Independently reviewed. ST at 103bpm.  Assessment/Plan Principal Problem:   Sepsis ([place]) Active Problems:   GERD   Breast cancer, male ([place])   Bone metastases ([place])   Multiple myeloma ([place])   Essential hypertension    1. Fever with suspected sepsis.  Follow blood cultures which are currently pending.  I will add urine analysis with reflex urine cultures.  Continue with vancomycin and Zosyn as well as IV fluid and recheck in a.m. lactic acid. Bolus additional 1L IVF now. 2. AKI.  Continue IV fluid and avoid nephrotoxic agents.  Recheck a.m. labs.  Monitor I's and O's closely. 3. Hypertension.  Hold home metoprolol for now due to soft blood pressure readings. Monitor in  SDU for now. 4. GERD.  Continue PPI. 5. Multiple myeloma and breast cancer.  Appreciate oncology consultation and evaluation.  Maintain  on acyclovir.   DVT prophylaxis: SCDs Code Status: Full Family Communication: Wife at bedside Disposition Plan: Further evaluation and treatment of sepsis Consults called: Oncology Dr.Katragadda Admission status: Inpatient, SDU    Darleen Crocker DO [facility] Pager [phone]  If 7PM-7AM, please contact night-coverage [email or web address] Password [facility]  04/21/2018, 12:43 AM

## 2018-04-22 DIAGNOSIS — C9001 Multiple myeloma in remission: Secondary | ICD-10-CM

## 2018-04-22 DIAGNOSIS — I10 Essential (primary) hypertension: Secondary | ICD-10-CM

## 2018-04-22 DIAGNOSIS — C50021 Malignant neoplasm of nipple and areola, right male breast: Secondary | ICD-10-CM

## 2018-04-22 DIAGNOSIS — K219 Gastro-esophageal reflux disease without esophagitis: Secondary | ICD-10-CM

## 2018-04-22 LAB — COMPREHENSIVE METABOLIC PANEL
ALT: 13 U/L — AB (ref 17–63)
ANION GAP: 8 (ref 5–15)
AST: 15 U/L (ref 15–41)
Albumin: 2.8 g/dL — ABNORMAL LOW (ref 3.5–5.0)
Alkaline Phosphatase: 22 U/L — ABNORMAL LOW (ref 38–126)
BUN: 26 mg/dL — ABNORMAL HIGH (ref 6–20)
CHLORIDE: 110 mmol/L (ref 101–111)
CO2: 23 mmol/L (ref 22–32)
CREATININE: 2.53 mg/dL — AB (ref 0.61–1.24)
Calcium: 8.5 mg/dL — ABNORMAL LOW (ref 8.9–10.3)
GFR, EST AFRICAN AMERICAN: 28 mL/min — AB (ref >60)
GFR, EST NON AFRICAN AMERICAN: 24 mL/min — AB (ref >60)
Glucose, Bld: 110 mg/dL — ABNORMAL HIGH (ref 65–99)
Potassium: 3.9 mmol/L (ref 3.5–5.1)
SODIUM: 141 mmol/L (ref 135–145)
Total Bilirubin: 1 mg/dL (ref 0.3–1.2)
Total Protein: 4.8 g/dL — ABNORMAL LOW (ref 6.5–8.1)

## 2018-04-22 LAB — CBC WITH DIFFERENTIAL/PLATELET
Basophils Absolute: 0 K/uL (ref 0.0–0.1)
Basophils Relative: 0 %
Eosinophils Absolute: 0.2 K/uL (ref 0.0–0.7)
Eosinophils Relative: 3 %
HCT: 26.8 % — ABNORMAL LOW (ref 39.0–52.0)
Hemoglobin: 8.6 g/dL — ABNORMAL LOW (ref 13.0–17.0)
Lymphocytes Relative: 5 %
Lymphs Abs: 0.4 K/uL — ABNORMAL LOW (ref 0.7–4.0)
MCH: 32.3 pg (ref 26.0–34.0)
MCHC: 32.1 g/dL (ref 30.0–36.0)
MCV: 100.8 fL — ABNORMAL HIGH (ref 78.0–100.0)
Monocytes Absolute: 0.8 K/uL (ref 0.1–1.0)
Monocytes Relative: 11 %
Neutro Abs: 6.1 K/uL (ref 1.7–7.7)
Neutrophils Relative %: 81 %
Platelets: 85 K/uL — ABNORMAL LOW (ref 150–400)
RBC: 2.66 MIL/uL — ABNORMAL LOW (ref 4.22–5.81)
RDW: 23.5 % — ABNORMAL HIGH (ref 11.5–15.5)
WBC: 7.6 K/uL (ref 4.0–10.5)

## 2018-04-22 LAB — BLOOD CULTURE ID PANEL (REFLEXED)

## 2018-04-22 LAB — TYPE AND SCREEN
ABO/RH(D): O POS
Antibody Screen: NEGATIVE
Unit division: 0

## 2018-04-22 LAB — URINE CULTURE
CULTURE: NO GROWTH
SPECIAL REQUESTS: NORMAL

## 2018-04-22 LAB — BPAM RBC
Blood Product Expiration Date: 201905032359
ISSUE DATE / TIME: 201904251238
Unit Type and Rh: 9500

## 2018-04-22 MED ORDER — DOXYCYCLINE HYCLATE 100 MG PO TABS
100.0000 mg | ORAL_TABLET | Freq: Two times a day (BID) | ORAL | Status: DC
  Administered 2018-04-22: 100 mg via ORAL
  Filled 2018-04-22: qty 1

## 2018-04-22 MED ORDER — DOXYCYCLINE HYCLATE 100 MG PO TABS: 100.0000 mg | ORAL_TABLET | Freq: Two times a day (BID) | ORAL | 0 refills | Status: AC

## 2018-04-22 NOTE — Discharge Summary (Signed)
Physician Discharge Summary  ZECHARIAH BISSONNETTE INO:[id number] DOB: 1946-03-14 DOA: 04/20/2018  PCP: [name], MD Oncologist: Lamonte Richer  Admit date: 04/20/2018 Discharge date: 04/22/2018  Admitted From: Home  Disposition: Home  Recommendations for Outpatient Follow-up:  1. Follow up with PCP in 2 weeks 2. Follow-up with oncologist in 1 week 3. Please obtain BMP/CBC in one week 4. Please follow up on the following pending results: Final Culture Data  Discharge Condition: Stable CODE STATUS: Full  Brief Hospitalization Summary: Please see all hospital notes, images, labs for full details of the hospitalization.  HPI: KOLBEY TEICHERT is a 72 y.o. male with medical history significant for stage II IgG kappa multiple myeloma currently on chemotherapy, metastatic left breast cancer to bones, peripheral neuropathy, hypertension, and GERD who received chemotherapy for his multiple myeloma earlier today and shortly after he arrived home he felt quite weak and was noted to have a fever.  He developed some nausea as well but did not have any vomiting.  He takes Compazine for nausea and his family gave him some Tylenol prior to arrival to the ED.  He denies any radiation of his pain or any modifying factors. He denies any chest pain, shortness of breath cough, abdominal pain, or diarrhea.   ED Course: His vital signs demonstrate some tachycardia and soft blood pressure readings, but he appears overall stable.  He has leukocytosis of 15,600 as well as hemoglobin of 8 and platelets of 133.  These values appear to be stable.  His creatinine is 1.51 where he was noted to be 1.09 recently on 03/28/2018.  His lactic acid level is 2.8.  His chest x-ray demonstrates no acute findings.  It appears that a urine analysis was not collected.  Blood cultures have been collected, however and vancomycin and Zosyn have been initiated.  He has also received a 1 L fluid bolus and Dr. Delton Coombes of oncology was called to see  patient.    1. Fever with suspected sepsis.  Follow blood cultures which are no growth to date.  We suspect that his symptoms are related to an acute upper respiratory illness and bronchitis.  He was treated with broad-spectrum antibiotics with good improvement.  His leukocytosis has trended down to normal.  He feels much better and will be discharged home on oral doxycycline twice daily for 5 additional days.  He will follow-up with his oncologist next week.   Urine analysis and urine culture negative.   Lactate trending down after IV fluid hydration. 2. Anemia of chronic kidney disease-patient was transfused 2 units of packed red blood cells and his hemoglobin has improved to greater than 8 prior to discharge.  Recheck CBC with hematologist next week on follow-up. 3. AKI on CKD stage III.    Some improvement noted with IV fluid hydration. 4. Hypertension.    He can resume his home blood pressure medications at discharge.  His blood pressure is improved considerably with IV fluid hydration. 5. GERD.  Continue PPI. 6. Multiple myeloma and breast cancer.  Appreciate oncology consultation and evaluation.  Maintain on acyclovir.  Follow-up with oncology outpatient.   DVT prophylaxis: SCDs Code Status: Full Family Communication: Wife at bedside Disposition Plan: Home  Consults called: Oncology [place] Admission status: Inpatient, SDU  Discharge Diagnoses:  Principal Problem:   Sepsis ([place]) Active Problems:   GERD   Breast cancer, male ([place])   Bone metastases ([place])   Multiple myeloma ([place])   Essential hypertension    Discharge Instructions: Discharge  Instructions    Call MD for:  difficulty breathing, headache or visual disturbances   Complete by:  As directed    Call MD for:  extreme fatigue   Complete by:  As directed    Call MD for:  persistant dizziness or light-headedness   Complete by:  As directed    Call MD for:  persistant nausea and vomiting   Complete by:  As  directed    Call MD for:  severe uncontrolled pain   Complete by:  As directed    Diet - low sodium heart healthy   Complete by:  As directed    Increase activity slowly   Complete by:  As directed      Allergies as of 04/22/2018   No Known Allergies     Medication List    TAKE these medications   acyclovir 400 MG tablet Commonly known as:  ZOVIRAX Take 400 mg by mouth 2 (two) times daily.   albuterol 108 (90 Base) MCG/ACT inhaler Commonly known as:  PROVENTIL HFA;VENTOLIN HFA Inhale 2 puffs into the lungs every 6 (six) hours as needed for wheezing or shortness of breath.   ALPRAZolam 0.5 MG tablet Commonly known as:  XANAX TAKE ONE TABLET BY MOUTH AT BEDTIME AS NEEDED FOR ANXIETY What changed:  See the new instructions.   aspirin EC 81 MG tablet Take 162 mg by mouth daily.   calcium-vitamin D 500-200 MG-UNIT Tabs tablet Commonly known as:  OSCAL WITH D TAKE TWO TABLETS BY MOUTH DAILY What changed:    how much to take  how to take this  when to take this   cyclophosphamide 2 g chemo injection Commonly known as:  CYTOXAN Inject into the vein once. Day 1, day 8, day 15, every 28 days for 2 months   doxycycline 100 MG tablet Commonly known as:  VIBRA-TABS Take 1 tablet (100 mg total) by mouth every 12 (twelve) hours for 5 days.   folic acid 1 MG tablet Commonly known as:  FOLVITE Take 1 mg by mouth daily.   gabapentin 300 MG capsule Commonly known as:  NEURONTIN Take two times a day, then increase to three times a day in a few weeks. What changed:    how much to take  how to take this  when to take this  additional instructions   guaiFENesin 600 MG 12 hr tablet Commonly known as:  MUCINEX Take 600 mg by mouth 2 (two) times daily as needed for cough.   KYPROLIS IV Inject into the vein. Day 1 and 2, day 8 and 9, day 15 and 16 every 28 days for 2 months   loratadine 10 MG tablet Commonly known as:  CLARITIN Take 10 mg by mouth daily.    metoprolol succinate 50 MG 24 hr tablet Commonly known as:  TOPROL-XL Take 50 mg by mouth daily.   multivitamin with minerals Tabs tablet Take 1 tablet by mouth daily.   ondansetron 8 MG tablet Commonly known as:  ZOFRAN Take 1 tablet (8 mg total) by mouth every 8 (eight) hours as needed for nausea or vomiting.   pantoprazole 40 MG tablet Commonly known as:  PROTONIX Take 40 mg by mouth every evening.   prochlorperazine 10 MG tablet Commonly known as:  COMPAZINE Take 1 tablet (10 mg total) by mouth every 6 (six) hours as needed for nausea or vomiting.   tamoxifen 20 MG tablet Commonly known as:  NOLVADEX TAKE 1 TABLET EVERY DAY  XGEVA 120 MG/1.7ML Soln injection Generic drug:  denosumab Inject 120 mg into the skin once.      Follow-up Information    [name], MD. Schedule an appointment as soon as possible for a visit in 2 week(s).   Specialty:  Family Medicine Contact information: [place] [address] [phone]        Derek Jack, MD. Schedule an appointment as soon as possible for a visit in 1 week(s).   Specialty:  Hematology Why:  Hospital Follow Up  Contact information: [place] [address] [phone]          No Known Allergies Allergies as of 04/22/2018   No Known Allergies     Medication List    TAKE these medications   acyclovir 400 MG tablet Commonly known as:  ZOVIRAX Take 400 mg by mouth 2 (two) times daily.   albuterol 108 (90 Base) MCG/ACT inhaler Commonly known as:  PROVENTIL HFA;VENTOLIN HFA Inhale 2 puffs into the lungs every 6 (six) hours as needed for wheezing or shortness of breath.   ALPRAZolam 0.5 MG tablet Commonly known as:  XANAX TAKE ONE TABLET BY MOUTH AT BEDTIME AS NEEDED FOR ANXIETY What changed:  See the new instructions.   aspirin EC 81 MG tablet Take 162 mg by mouth daily.   calcium-vitamin D 500-200 MG-UNIT Tabs tablet Commonly known as:  OSCAL WITH D TAKE TWO  TABLETS BY MOUTH DAILY What changed:    how much to take  how to take this  when to take this   cyclophosphamide 2 g chemo injection Commonly known as:  CYTOXAN Inject into the vein once. Day 1, day 8, day 15, every 28 days for 2 months   doxycycline 100 MG tablet Commonly known as:  VIBRA-TABS Take 1 tablet (100 mg total) by mouth every 12 (twelve) hours for 5 days.   folic acid 1 MG tablet Commonly known as:  FOLVITE Take 1 mg by mouth daily.   gabapentin 300 MG capsule Commonly known as:  NEURONTIN Take two times a day, then increase to three times a day in a few weeks. What changed:    how much to take  how to take this  when to take this  additional instructions   guaiFENesin 600 MG 12 hr tablet Commonly known as:  MUCINEX Take 600 mg by mouth 2 (two) times daily as needed for cough.   KYPROLIS IV Inject into the vein. Day 1 and 2, day 8 and 9, day 15 and 16 every 28 days for 2 months   loratadine 10 MG tablet Commonly known as:  CLARITIN Take 10 mg by mouth daily.   metoprolol succinate 50 MG 24 hr tablet Commonly known as:  TOPROL-XL Take 50 mg by mouth daily.   multivitamin with minerals Tabs tablet Take 1 tablet by mouth daily.   ondansetron 8 MG tablet Commonly known as:  ZOFRAN Take 1 tablet (8 mg total) by mouth every 8 (eight) hours as needed for nausea or vomiting.   pantoprazole 40 MG tablet Commonly known as:  PROTONIX Take 40 mg by mouth every evening.   prochlorperazine 10 MG tablet Commonly known as:  COMPAZINE Take 1 tablet (10 mg total) by mouth every 6 (six) hours as needed for nausea or vomiting.   tamoxifen 20 MG tablet Commonly known as:  NOLVADEX TAKE 1 TABLET EVERY DAY   XGEVA 120 MG/1.7ML Soln injection Generic drug:  denosumab Inject 120  mg into the skin once.       Procedures/Studies: Dg Chest 2 View  Result Date: 04/20/2018 CLINICAL DATA:  Fever and not feeling well. History of breast cancer, multiple  myeloma, bone metastases, chemotherapy today. EXAM: CHEST - 2 VIEW COMPARISON:  None. FINDINGS: Shallow inspiration. Heart size and pulmonary vascularity are normal for technique. Lungs are clear and expanded. No airspace disease or consolidation. No blunting of costophrenic angles. No pneumothorax. Mediastinal contours appear intact. Degenerative changes in the spine. Heterogeneous sclerosis in the vertebrae may correspond to known metastatic disease. Old healed fracture deformity of the right clavicle. Postoperative intramedullary rod fixation of the right humerus. Multiple old right rib fractures. IMPRESSION: No evidence of active pulmonary disease. Multiple old fracture deformities. Suggestion of heterogeneous sclerosis in some of the thoracic vertebra possibly corresponding to history of metastatic disease. Electronically Signed   By: Lucienne Capers M.D.   On: 04/20/2018 22:26      Subjective: Pt reports that he is feeling much better, cough improving.  Fever resolved.   Discharge Exam: Vitals:   04/22/18 0700 04/22/18 0723  BP: 135/86   Pulse: 84 82  Resp: 18 17  Temp:  99 F (37.2 C)  SpO2: 93% 91%   Vitals:   04/22/18 0500 04/22/18 0600 04/22/18 0700 04/22/18 0723  BP: (!) 148/94 136/81 135/86   Pulse: 84 79 84 82  Resp: (!) _0 Temp:    99 F (37.2 C)  TempSrc:    Oral  SpO2: 98% 98% 93% 91%  Weight:      Height:        General: Pt is alert, awake, not in acute distress Cardiovascular: RRR, S1/S2 +, no rubs, no gallops Respiratory: CTA bilaterally, no wheezing, no rhonchi Abdominal: Soft, NT, ND, bowel sounds + Extremities: no edema, no cyanosis   The results of significant diagnostics from this hospitalization (including imaging, microbiology, ancillary and laboratory) are listed below for reference.     Microbiology: Recent Results (from the past 240 hour(s))  Blood Culture (routine x 2)     Status: None (Preliminary result)   Collection Time: 04/20/18   9:23 PM  Result Value Ref Range Status   Specimen Description   Final    BLOOD RIGHT WRIST Performed at [facility], [address] [place], [place] [address]    Special Requests   Final    BOTTLES DRAWN AEROBIC AND ANAEROBIC Blood Culture adequate volume Performed at [facility], [address], [place], [place] [address]    Culture  Setup Time   Final    Performed at [place] [facility] Stain Report Called to,Read Back By and Verified With: HOWARD,C AT 2155 ON 4.25.2019 BY ISLEY,B Organism ID to follow CRITICAL RESULT CALLED TO, READ BACK BY AND VERIFIED WITHTana Conch RN [phone] 04/22/18 A BROWNING IN BOTH AEROBIC AND ANAEROBIC BOTTLES Performed at [facility], [place] [address], [place], [place] [address]    Culture PENDING  Incomplete   Report Status PENDING  Incomplete  Blood Culture ID Panel (Reflexed)     Status: Abnormal   Collection Time: 04/20/18  9:23 PM  Result Value Ref Range Status   Enterococcus species NOT DETECTED NOT DETECTED Final   Listeria monocytogenes NOT DETECTED NOT DETECTED Final   Staphylococcus species DETECTED (A) NOT DETECTED Final    Comment: Methicillin (oxacillin) resistant coagulase negative staphylococcus. Possible blood culture contaminant (unless isolated from more than one blood  culture draw or clinical case suggests pathogenicity). No antibiotic treatment is indicated for blood  culture contaminants. CRITICAL RESULT CALLED TO, READ BACK BY AND VERIFIED WITH: Tana Conch RN [phone] 04/22/18 A BROWNING    Staphylococcus aureus NOT DETECTED NOT DETECTED Final   Methicillin resistance DETECTED (A) NOT DETECTED Final    Comment: CRITICAL RESULT CALLED TO, READ BACK BY AND VERIFIED WITHTana Conch RN [phone] 04/22/18 A BROWNING    Streptococcus species NOT DETECTED NOT DETECTED Final   Streptococcus agalactiae NOT DETECTED NOT DETECTED Final   Streptococcus pneumoniae NOT DETECTED NOT DETECTED Final   Streptococcus pyogenes NOT  DETECTED NOT DETECTED Final   Acinetobacter baumannii NOT DETECTED NOT DETECTED Final   Enterobacteriaceae species NOT DETECTED NOT DETECTED Final   Enterobacter cloacae complex NOT DETECTED NOT DETECTED Final   Escherichia coli NOT DETECTED NOT DETECTED Final   Klebsiella oxytoca NOT DETECTED NOT DETECTED Final   Klebsiella pneumoniae NOT DETECTED NOT DETECTED Final   Proteus species NOT DETECTED NOT DETECTED Final   Serratia marcescens NOT DETECTED NOT DETECTED Final   Haemophilus influenzae NOT DETECTED NOT DETECTED Final   Neisseria meningitidis NOT DETECTED NOT DETECTED Final   Pseudomonas aeruginosa NOT DETECTED NOT DETECTED Final   Candida albicans NOT DETECTED NOT DETECTED Final   Candida glabrata NOT DETECTED NOT DETECTED Final   Candida krusei NOT DETECTED NOT DETECTED Final   Candida parapsilosis NOT DETECTED NOT DETECTED Final   Candida tropicalis NOT DETECTED NOT DETECTED Final    Comment: Performed at [facility], [place]. [address] [place], [place] [address]  Blood Culture (routine x 2)     Status: None (Preliminary result)   Collection Time: 04/20/18  9:33 PM  Result Value Ref Range Status   Specimen Description BLOOD RIGHT FOREARM DRAWN BY RN  Final   Special Requests   Final    BOTTLES DRAWN AEROBIC AND ANAEROBIC Blood Culture adequate volume   Culture   Final    NO GROWTH 2 DAYS Performed at [facility], [address], [place], [place] [address]    Report Status PENDING  Incomplete  MRSA PCR Screening     Status: None   Collection Time: 04/21/18  1:54 AM  Result Value Ref Range Status   MRSA by PCR NEGATIVE NEGATIVE Final    Comment:        The GeneXpert MRSA Assay (FDA approved for NASAL specimens only), is one component of a comprehensive MRSA colonization surveillance program. It is not intended to diagnose MRSA infection nor to guide or monitor treatment for MRSA infections. Performed at [facility], [address] [place], [place] [address]    Urine culture     Status: None   Collection Time: 04/21/18  2:00 AM  Result Value Ref Range Status   Specimen Description   Final    URINE, CLEAN CATCH Performed at [facility], [address] [place], [place] [address]    Special Requests   Final    Normal Performed at [facility], [address] [place], [place] [address]    Culture   Final    NO GROWTH Performed at [facility], [place] [address], [place], [place] [address]    Report Status 04/22/2018 FINAL  Final     Labs: BNP (last 3 results) No results for input(s): BNP in the last 8760 hours. Basic Metabolic Panel: Recent Labs  Lab 04/18/18 0822 04/20/18 2122 04/21/18 0417 04/22/18 0423  NA 138 142  139 141  K 3.8 3.6 3.9 3.9  CL 106 107 108 110  CO2 23 22 19* 23  GLUCOSE 105* 132* 115* 110*  BUN 17 18 22* 26*  CREATININE 1.26* 1.51* 2.41* 2.53*  CALCIUM 9.0 9.0 8.0* 8.5*   Liver Function Tests: Recent Labs  Lab 04/18/18 0822 04/20/18 2122 04/22/18 0423  AST _0 ALT 14* 13* 13*  ALKPHOS 28* 27* 22*  BILITOT 0.8 0.9 1.0  PROT 5.7* 5.6* 4.8*  ALBUMIN 3.7 3.6 2.8*   No results for input(s): LIPASE, AMYLASE in the last 168 hours. No results for input(s): AMMONIA in the last 168 hours. CBC: Recent Labs  Lab 04/18/18 0822 04/20/18 2122 04/21/18 0417 04/22/18 0423  WBC 3.7* 15.6* 17.0* 7.6  NEUTROABS 2.6 14.4  --  6.1  HGB 8.3* 8.0* 6.8* 8.6*  HCT 25.4* 24.6* 20.6* 26.8*  MCV 106.7* 108.4* 107.3* 100.8*  PLT 157 133* 120* 85*   Cardiac Enzymes: No results for input(s): CKTOTAL, CKMB, CKMBINDEX, TROPONINI in the last 168 hours. BNP: Invalid input(s): POCBNP CBG: No results for input(s): GLUCAP in the last 168 hours. D-Dimer No results for input(s): DDIMER in the last 72 hours. Hgb A1c No results for input(s): HGBA1C in the last 72 hours. Lipid Profile No results for input(s): CHOL, HDL, LDLCALC, TRIG, CHOLHDL, LDLDIRECT in the last 72 hours. Thyroid function studies No results  for input(s): TSH, T4TOTAL, T3FREE, THYROIDAB in the last 72 hours.  Invalid input(s): FREET3 Anemia work up No results for input(s): VITAMINB12, FOLATE, FERRITIN, TIBC, IRON, RETICCTPCT in the last 72 hours. Urinalysis    Component Value Date/Time   COLORURINE YELLOW 04/21/2018 0200   APPEARANCEUR HAZY (A) 04/21/2018 0200   LABSPEC 1.018 04/21/2018 0200   PHURINE 5.0 04/21/2018 0200   GLUCOSEU NEGATIVE 04/21/2018 0200   HGBUR NEGATIVE 04/21/2018 0200   BILIRUBINUR NEGATIVE 04/21/2018 0200   KETONESUR NEGATIVE 04/21/2018 0200   PROTEINUR NEGATIVE 04/21/2018 0200   NITRITE NEGATIVE 04/21/2018 0200   LEUKOCYTESUR TRACE (A) 04/21/2018 0200   Sepsis Labs Invalid input(s): PROCALCITONIN,  WBC,  LACTICIDVEN Microbiology Recent Results (from the past 240 hour(s))  Blood Culture (routine x 2)     Status: None (Preliminary result)   Collection Time: 04/20/18  9:23 PM  Result Value Ref Range Status   Specimen Description   Final    BLOOD RIGHT WRIST Performed at [facility], [address], [place], [place] [address]    Special Requests   Final    BOTTLES DRAWN AEROBIC AND ANAEROBIC Blood Culture adequate volume Performed at [facility], [address] [place], [place] [address]    Culture  Setup Time   Final    Performed at [place] [facility] Stain Report Called to,Read Back By and Verified With: HOWARD,C AT 2155 ON 4.25.2019 BY ISLEY,B Organism ID to follow CRITICAL RESULT CALLED TO, READ BACK BY AND VERIFIED WITHTana Conch RN [phone] 04/22/18 A BROWNING IN BOTH AEROBIC AND ANAEROBIC BOTTLES Performed at [facility], [place] [address] [place], [place] [address]    Culture PENDING  Incomplete   Report Status PENDING  Incomplete  Blood Culture ID Panel (Reflexed)     Status: Abnormal   Collection Time: 04/20/18  9:23 PM  Result Value Ref Range Status   Enterococcus species NOT DETECTED NOT DETECTED Final   Listeria monocytogenes NOT DETECTED NOT  DETECTED Final   Staphylococcus species DETECTED (A) NOT DETECTED Final  Comment: Methicillin (oxacillin) resistant coagulase negative staphylococcus. Possible blood culture contaminant (unless isolated from more than one blood culture draw or clinical case suggests pathogenicity). No antibiotic treatment is indicated for blood  culture contaminants. CRITICAL RESULT CALLED TO, READ BACK BY AND VERIFIED WITH: Tana Conch RN [phone] 04/22/18 A BROWNING    Staphylococcus aureus NOT DETECTED NOT DETECTED Final   Methicillin resistance DETECTED (A) NOT DETECTED Final    Comment: CRITICAL RESULT CALLED TO, READ BACK BY AND VERIFIED WITHTana Conch RN [phone] 04/22/18 A BROWNING    Streptococcus species NOT DETECTED NOT DETECTED Final   Streptococcus agalactiae NOT DETECTED NOT DETECTED Final   Streptococcus pneumoniae NOT DETECTED NOT DETECTED Final   Streptococcus pyogenes NOT DETECTED NOT DETECTED Final   Acinetobacter baumannii NOT DETECTED NOT DETECTED Final   Enterobacteriaceae species NOT DETECTED NOT DETECTED Final   Enterobacter cloacae complex NOT DETECTED NOT DETECTED Final   Escherichia coli NOT DETECTED NOT DETECTED Final   Klebsiella oxytoca NOT DETECTED NOT DETECTED Final   Klebsiella pneumoniae NOT DETECTED NOT DETECTED Final   Proteus species NOT DETECTED NOT DETECTED Final   Serratia marcescens NOT DETECTED NOT DETECTED Final   Haemophilus influenzae NOT DETECTED NOT DETECTED Final   Neisseria meningitidis NOT DETECTED NOT DETECTED Final   Pseudomonas aeruginosa NOT DETECTED NOT DETECTED Final   Candida albicans NOT DETECTED NOT DETECTED Final   Candida glabrata NOT DETECTED NOT DETECTED Final   Candida krusei NOT DETECTED NOT DETECTED Final   Candida parapsilosis NOT DETECTED NOT DETECTED Final   Candida tropicalis NOT DETECTED NOT DETECTED Final    Comment: Performed at [facility], [place]. [address] [place], [place] [address]  Blood Culture (routine x 2)     Status: None  (Preliminary result)   Collection Time: 04/20/18  9:33 PM  Result Value Ref Range Status   Specimen Description BLOOD RIGHT FOREARM DRAWN BY RN  Final   Special Requests   Final    BOTTLES DRAWN AEROBIC AND ANAEROBIC Blood Culture adequate volume   Culture   Final    NO GROWTH 2 DAYS Performed at [facility], [address], [place], [place] [address]    Report Status PENDING  Incomplete  MRSA PCR Screening     Status: None   Collection Time: 04/21/18  1:54 AM  Result Value Ref Range Status   MRSA by PCR NEGATIVE NEGATIVE Final    Comment:        The GeneXpert MRSA Assay (FDA approved for NASAL specimens only), is one component of a comprehensive MRSA colonization surveillance program. It is not intended to diagnose MRSA infection nor to guide or monitor treatment for MRSA infections. Performed at [facility], [address] [place], [place] [address]   Urine culture     Status: None   Collection Time: 04/21/18  2:00 AM  Result Value Ref Range Status   Specimen Description   Final    URINE, CLEAN CATCH Performed at [facility], [address] [place], [place] [address]    Special Requests   Final    Normal Performed at [facility], [address], [place], [place] [address]    Culture   Final    NO GROWTH Performed at [facility], [place] [address], [place], [place] [address]    Report Status 04/22/2018 FINAL  Final    Time coordinating discharge: 34 mins   SIGNED:  Irwin Brakeman, MD  [facility] 04/22/2018, 8:30 AM  Pager [phone]  If 7PM-7AM, please contact night-coverage [email or web address] Password [email or web address]

## 2018-04-22 NOTE — Progress Notes (Signed)
Patient alert and oriented x4. No complaints of pain, shortness of breath, dizziness, chest pain, nausea or vomiting. Patient up out of bed independently, gait steady. Discharge instructions and education gone over with patient and wife. Both expressed full understanding of instructions and education along with follow up with ALL providers dates, times and locations. Patient discharged home with all belongings with wife via car. Patient aware to pick up prescription at drug store in [place].

## 2018-04-22 NOTE — Discharge Instructions (Signed)
Follow with Primary MD  [name], MD  and other consultant's as instructed your Hospitalist MD  Please get a complete blood count and chemistry panel checked by your Primary MD at your next visit, and again as instructed by your Primary MD.  Get Medicines reviewed and adjusted: Please take all your medications with you for your next visit with your Primary MD  Laboratory/radiological data: Please request your Primary MD to go over all hospital tests and procedure/radiological results at the follow up, please ask your Primary MD to get all Hospital records sent to his/her office.  In some cases, they will be blood work, cultures and biopsy results pending at the time of your discharge. Please request that your primary care M.D. follows up on these results.  Also Note the following: If you experience worsening of your admission symptoms, develop shortness of breath, life threatening emergency, suicidal or homicidal thoughts you must seek medical attention immediately by calling 911 or calling your MD immediately  if symptoms less severe.  You must read complete instructions/literature along with all the possible adverse reactions/side effects for all the Medicines you take and that have been prescribed to you. Take any new Medicines after you have completely understood and accpet all the possible adverse reactions/side effects.   Do not drive when taking Pain medications or sleeping medications (Benzodaizepines)  Do not take more than prescribed Pain, Sleep and Anxiety Medications. It is not advisable to combine anxiety,sleep and pain medications without talking with your primary care practitioner  Special Instructions: If you have smoked or chewed Tobacco  in the last 2 yrs please stop smoking, stop any regular Alcohol  and or any Recreational drug use.  Wear Seat belts while driving.  Please note: You were cared for by a hospitalist during your hospital stay. Once you are discharged,  your primary care physician will handle any further medical issues. Please note that NO REFILLS for any discharge medications will be authorized once you are discharged, as it is imperative that you return to your primary care physician (or establish a relationship with a primary care physician if you do not have one) for your post hospital discharge needs so that they can reassess your need for medications and monitor your lab values.      Fever, Adult A fever is an increase in the bodys temperature. It is often defined as a temperature of 100 F (38C) or higher. Short mild or moderate fevers often have no long-term effects. They also often do not need treatment. Moderate or high fevers may make you feel uncomfortable. Sometimes, they can also be a sign of a serious illness or disease. The sweating that may happen with repeated fevers or fevers that last a while may also cause you to not have enough fluid in your body (dehydration). You can take your temperature with a thermometer to see if you have a fever. A measured temperature can change with:  Age.  Time of day.  Where the thermometer is placed: ? Mouth (oral). ? Rectum (rectal). ? Ear (tympanic). ? Underarm (axillary). ? Forehead (temporal).  Follow these instructions at home: Pay attention to any changes in your symptoms. Take these actions to help with your condition:  Take over-the-counter and prescription medicines only as told by your doctor. Follow the dosing instructions carefully.  If you were prescribed an antibiotic medicine, take it as told by your doctor. Do not stop taking the antibiotic even if you start to feel better.  Rest as needed.  Drink enough fluid to keep your pee (urine) clear or pale yellow.  Sponge yourself or bathe with room-temperature water as needed. This helps to lower your body temperature . Do not use ice water.  Do not wear too many blankets or heavy clothes.  Contact a doctor if:  You  throw up (vomit).  You cannot eat or drink without throwing up.  You have watery poop (diarrhea).  It hurts when you pee.  Your symptoms do not get better with treatment.  You have new symptoms.  You feel very weak. Get help right away if:  You are short of breath or have trouble breathing.  You are dizzy or you pass out (faint).  You feel confused.  You have signs of not having enough fluid in your body, such as: ? A dry mouth. ? Peeing less. ? Looking pale.  You have very bad pain in your belly (abdomen).  You keep throwing up or having water poop.  You have a skin rash.  Your symptoms suddenly get worse. This information is not intended to replace advice given to you by your health care provider. Make sure you discuss any questions you have with your health care provider. Document Released: 09/22/2008 Document Revised: 05/21/2016 Document Reviewed: 02/07/2015 Elsevier Interactive Patient Education  2018 [employer].  Anemia Anemia is a condition in which you do not have enough red blood cells or hemoglobin. Hemoglobin is a substance in red blood cells that carries oxygen. When you do not have enough red blood cells or hemoglobin (are anemic), your body cannot get enough oxygen and your organs may not work properly. As a result, you may feel very tired or have other problems. What are the causes? Common causes of anemia include:  Excessive bleeding. Anemia can be caused by excessive bleeding inside or outside the body, including bleeding from the intestine or from periods in women.  Poor nutrition.  Long-lasting (chronic) kidney, thyroid, and liver disease.  Bone marrow disorders.  Cancer and treatments for cancer.  HIV (human immunodeficiency virus) and AIDS (acquired immunodeficiency syndrome).  Treatments for HIV and AIDS.  Spleen problems.  Blood disorders.  Infections, medicines, and autoimmune disorders that destroy red blood cells.  What are  the signs or symptoms? Symptoms of this condition include:  Minor weakness.  Dizziness.  Headache.  Feeling heartbeats that are irregular or faster than normal (palpitations).  Shortness of breath, especially with exercise.  Paleness.  Cold sensitivity.  Indigestion.  Nausea.  Difficulty sleeping.  Difficulty concentrating.  Symptoms may occur suddenly or develop slowly. If your anemia is mild, you may not have symptoms. How is this diagnosed? This condition is diagnosed based on:  Blood tests.  Your medical history.  A physical exam.  Bone marrow biopsy.  Your health care provider may also check your stool (feces) for blood and may do additional testing to look for the cause of your bleeding. You may also have other tests, including:  Imaging tests, such as a CT scan or MRI.  Endoscopy.  Colonoscopy.  How is this treated? Treatment for this condition depends on the cause. If you continue to lose a lot of blood, you may need to be treated at a hospital. Treatment may include:  Taking supplements of iron, vitamin Y19, or folic acid.  Taking a hormone medicine (erythropoietin) that can help to stimulate red blood cell growth.  Having a blood transfusion. This may be needed if you lose a lot  of blood.  Making changes to your diet.  Having surgery to remove your spleen.  Follow these instructions at home:  Take over-the-counter and prescription medicines only as told by your health care provider.  Take supplements only as told by your health care provider.  Follow any diet instructions that you were given.  Keep all follow-up visits as told by your health care provider. This is important. Contact a health care provider if:  You develop new bleeding anywhere in the body. Get help right away if:  You are very weak.  You are short of breath.  You have pain in your abdomen or chest.  You are dizzy or feel faint.  You have trouble  concentrating.  You have bloody or black, tarry stools.  You vomit repeatedly or you vomit up blood. Summary  Anemia is a condition in which you do not have enough red blood cells or enough of a substance in your red blood cells that carries oxygen (hemoglobin).  Symptoms may occur suddenly or develop slowly.  If your anemia is mild, you may not have symptoms.  This condition is diagnosed with blood tests as well as a medical history and physical exam. Other tests may be needed.  Treatment for this condition depends on the cause of the anemia. This information is not intended to replace advice given to you by your health care provider. Make sure you discuss any questions you have with your health care provider. Document Released: 01/21/2005 Document Revised: 01/15/2017 Document Reviewed: 01/15/2017 Elsevier Interactive Patient Education  [employer].

## 2018-04-24 LAB — CULTURE, BLOOD (ROUTINE X 2): SPECIAL REQUESTS: ADEQUATE

## 2018-04-25 ENCOUNTER — Other Ambulatory Visit (HOSPITAL_COMMUNITY): Payer: Self-pay

## 2018-04-25 LAB — CULTURE, BLOOD (ROUTINE X 2)
Culture: NO GROWTH
Special Requests: ADEQUATE

## 2018-04-25 MED ORDER — LORATADINE 10 MG PO TABS: 10.0000 mg | ORAL_TABLET | Freq: Every day | ORAL | 1 refills | Status: DC

## 2018-04-28 ENCOUNTER — Other Ambulatory Visit: Payer: Self-pay

## 2018-04-28 ENCOUNTER — Inpatient Hospital Stay (HOSPITAL_COMMUNITY): Payer: Medicare Other | Attending: Internal Medicine | Admitting: Hematology

## 2018-04-28 ENCOUNTER — Inpatient Hospital Stay (HOSPITAL_COMMUNITY): Payer: Medicare Other

## 2018-04-28 ENCOUNTER — Encounter (HOSPITAL_COMMUNITY): Payer: Self-pay | Admitting: Hematology

## 2018-04-28 VITALS — BP 172/79 | HR 62 | Temp 98.3°F | Resp 18 | Wt 206.4 lb

## 2018-04-28 DIAGNOSIS — Z9484 Stem cells transplant status: Secondary | ICD-10-CM | POA: Insufficient documentation

## 2018-04-28 DIAGNOSIS — Z923 Personal history of irradiation: Secondary | ICD-10-CM | POA: Insufficient documentation

## 2018-04-28 DIAGNOSIS — Z5111 Encounter for antineoplastic chemotherapy: Secondary | ICD-10-CM | POA: Insufficient documentation

## 2018-04-28 DIAGNOSIS — C50922 Malignant neoplasm of unspecified site of left male breast: Secondary | ICD-10-CM | POA: Diagnosis not present

## 2018-04-28 DIAGNOSIS — Z79810 Long term (current) use of selective estrogen receptor modulators (SERMs): Secondary | ICD-10-CM | POA: Diagnosis not present

## 2018-04-28 DIAGNOSIS — C7951 Secondary malignant neoplasm of bone: Secondary | ICD-10-CM | POA: Diagnosis not present

## 2018-04-28 DIAGNOSIS — C9000 Multiple myeloma not having achieved remission: Secondary | ICD-10-CM

## 2018-04-28 DIAGNOSIS — C9001 Multiple myeloma in remission: Secondary | ICD-10-CM

## 2018-04-28 DIAGNOSIS — Z9221 Personal history of antineoplastic chemotherapy: Secondary | ICD-10-CM | POA: Insufficient documentation

## 2018-04-28 DIAGNOSIS — Z79811 Long term (current) use of aromatase inhibitors: Secondary | ICD-10-CM | POA: Diagnosis not present

## 2018-04-28 LAB — COMPREHENSIVE METABOLIC PANEL
ALT: 13 U/L — ABNORMAL LOW (ref 17–63)
AST: 17 U/L (ref 15–41)
Albumin: 3.5 g/dL (ref 3.5–5.0)
Alkaline Phosphatase: 23 U/L — ABNORMAL LOW (ref 38–126)
Anion gap: 9 (ref 5–15)
BUN: 19 mg/dL (ref 6–20)
CHLORIDE: 107 mmol/L (ref 101–111)
CO2: 24 mmol/L (ref 22–32)
Calcium: 9 mg/dL (ref 8.9–10.3)
Creatinine, Ser: 1.58 mg/dL — ABNORMAL HIGH (ref 0.61–1.24)
GFR calc non Af Amer: 43 mL/min — ABNORMAL LOW (ref >60)
GFR, EST AFRICAN AMERICAN: 49 mL/min — AB (ref >60)
Glucose, Bld: 101 mg/dL — ABNORMAL HIGH (ref 65–99)
Potassium: 3.9 mmol/L (ref 3.5–5.1)
SODIUM: 140 mmol/L (ref 135–145)
Total Bilirubin: 0.7 mg/dL (ref 0.3–1.2)
Total Protein: 5.5 g/dL — ABNORMAL LOW (ref 6.5–8.1)

## 2018-04-28 LAB — IRON AND TIBC
Iron: 54 ug/dL (ref 45–182)
SATURATION RATIOS: 18 % (ref 17.9–39.5)
TIBC: 294 ug/dL (ref 250–450)
UIBC: 240 ug/dL

## 2018-04-28 LAB — CBC
HCT: 24.6 % — ABNORMAL LOW (ref 39.0–52.0)
Hemoglobin: 8.3 g/dL — ABNORMAL LOW (ref 13.0–17.0)
MCH: 34.4 pg — AB (ref 26.0–34.0)
MCHC: 33.7 g/dL (ref 30.0–36.0)
MCV: 102.1 fL — ABNORMAL HIGH (ref 78.0–100.0)
PLATELETS: 99 10*3/uL — AB (ref 150–400)
RBC: 2.41 MIL/uL — AB (ref 4.22–5.81)
RDW: 19.3 % — ABNORMAL HIGH (ref 11.5–15.5)
WBC: 5.3 10*3/uL (ref 4.0–10.5)

## 2018-04-28 LAB — FERRITIN: Ferritin: 591 ng/mL — ABNORMAL HIGH (ref 24–336)

## 2018-04-28 NOTE — Patient Instructions (Signed)
[facility] at [facility] Discharge Instructions  Today you saw Dr. K.   Thank you for choosing [facility] at [facility] to provide your oncology and hematology care.  To afford each patient quality time with our provider, please arrive at least 15 minutes before your scheduled appointment time.   If you have a lab appointment with the Cancer Center please come in thru the  Main Entrance and check in at the main information desk  You need to re-schedule your appointment should you arrive 10 or more minutes late.  We strive to give you quality time with our providers, and arriving late affects you and other patients whose appointments are after yours.  Also, if you no show three or more times for appointments you may be dismissed from the clinic at the providers discretion.     Again, thank you for choosing [facility].  Our hope is that these requests will decrease the amount of time that you wait before being seen by our physicians.       _____________________________________________________________  Should you have questions after your visit to [facility], please contact our office at [phone] between the hours of 8:30 a.m. and 4:30 p.m.  Voicemails left after 4:30 p.m. will not be returned until the following business day.  For prescription refill requests, have your pharmacy contact our office.       Resources For Cancer Patients and their Caregivers ? American Cancer Society: Can assist with transportation, wigs, general needs, runs Look Good Feel Better.        1-888-227-6333 ? Cancer Care: Provides financial assistance, online support groups, medication/co-pay assistance.  1-800-813-HOPE (4673) ? [facility] Assists [place] cancer patients and their families through emotional , educational and financial support.  336-427-4357 ? [place] DSS Where to apply for food  stamps, Medicaid and utility assistance. 336-342-1394 ? [facility]: Transportation to medical appointments. 336-347-2287 ? Social Security Administration: May apply for disability if have a Stage IV cancer. 336-342-7796 1-800-772-1213 ? [place] [facility]ging, Disability and Transit Services: Assists with nutrition, care and transit needs. 336-349-2343  Cancer Center Support Programs:   > Cancer Support Group  2nd Tuesday of the month 1pm-2pm, Journey Room   > Creative Journey  3rd Tuesday of the month 1130am-1pm, Journey Room    

## 2018-04-28 NOTE — Assessment & Plan Note (Addendum)
1.  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  He was recommended to have maintenance with carfilzomib 70 mg/m square on days 1 and 15 every 28 days by Dr. Norma Fredrickson. -He received his first carfilzomib high-dose on 04/20/2018, developed fever with chills 3 hours later with weakness in the extremities.  He was admitted to the hospital, received 1 unit of blood transfusion.  Blood cultures turned out to be negative.  He was febrile after hospitalization and was sent home.  Today he feels better and his strength is back.  We have done blood work and his creatinine has come down to his baseline of 1.5.  However he remains anemic with hemoglobin of 8.3.  He also has macrocytosis.  We have ordered ferritin and iron panel.  Will follow up on it to see if he needs any parenteral iron.  We will see him back next week.  I will also contact Dr. Norma Fredrickson to cut back on the dose of carfilzomib.  2.  Metastatic left breast cancer to the bones (Dx 2011): He was initially treated with lumpectomy.  He was initiated on tamoxifen.  Tamoxifen was restarted back 30 days after his bone marrow transplant.  3.  Peripheral neuropathy: He has tingling in the toes which has been stable.  He is taking gabapentin 300 mg twice a day.  4.  Infection prophylaxis: His Bactrim was discontinued.  He will continue acyclovir 400 mg twice daily.  5.  Hypertension: He is continuing metoprolol XL 50 mg daily.

## 2018-04-28 NOTE — Progress Notes (Signed)
[address] [place], [place] [address]   CLINIC:  Medical Oncology/Hematology  PCP:  [name], MD [place] [address] [phone]   REASON FOR VISIT:  Follow-up for Multiple myeloma treatment s/p stem cell transplant   CURRENT THERAPY: Cytoxan/Carfilzomib/Decadron  BRIEF ONCOLOGIC HISTORY:    Breast cancer, male ([place])   12/31/2009 Initial Biopsy    Biopsy of L breast       12/31/2009 Pathology Results    Invasive ductal carcinoma, ER/PR+, HER 2 negative      12/31/2009 Imaging    Ultrasound showing a 2.43 x 1.85 x 3 cm hypoechoic spiculated mass in the 12 o clock L breast retroareolar region      01/01/2010 -  Anti-estrogen oral therapy    Tamoxifen 20 mg daily      01/06/2010 Imaging    Bone scan abnormal uptake in the diaphysis of the R humerus, abnormal in the R third, fifth and sixth ribs, lesion also noted in the sternum.      02/03/2010 Surgery    Rod placement and fixation of R humerus by Dr. [name]      02/05/2010 - 02/18/2010 Radiation Therapy    30Gy in 10 fractions of 3 Gy per fraction to R pathologic fracture      03/11/2010 -  Chemotherapy    Denosumab monthly, now every 3 months. Started at [facility]       06/09/2016 Imaging    Three hypermetabolic osseous lesions in the sternum, left ilium and right ilium, as discussed above, likely represent osseous metastases. At this time, these are not recognizable on the CT images. 2. No extra skeletal metastatic disease identified in the neck, chest, abdomen or pelvis.      10/13/2016 Progression    PET shows various new and enlarging osseous metastatic lesions with no definite extra osseous metastatic disease currently identified.       12/31/2016 Progression    1. Multifocal hypermetabolic osseous metastases throughout the axial and proximal appendicular skeleton, which are increased in size, number and metabolism since 10/13/2016 PET-CT. 2. 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 3. No additional sites of hypermetabolic metastatic disease. 4. Chronic right mastoid sinusitis. 5. Aortic atherosclerosis.  One vessel coronary atherosclerosis.       Multiple myeloma ([place])   02/12/2017 Bone Marrow Biopsy    The marrow was variably cellular with large peritrabecular aggregates of kappa restricted plasma cells (66% by aspirate, 30% by Cd138). Cytogenetics +11.       03/01/2017 - 06/29/2017 Chemotherapy    RVD       05/26/2017 Bone Marrow Biopsy    Performed at [facility]:  Plasma cell myeloma in a 30% cellularmarrow with decreased trilineage hematopoiesis and 42% kappalight chain restricted plasma cells on the aspirate smears andlarge aggregates on the core biopsy.       07/12/2017 - 09/01/2017 Chemotherapy    3 cycles of carfizolmib/cyclophosphamide/dexamethasone        10/08/2017 Bone Marrow Transplant    Autotransplant at [place]: Cancer Staging Multiple myeloma [facility]) Staging form: Plasma Cell Myeloma and Plasma Cell Disorders, AJCC 8th Edition - Clinical stage from 05/03/2017: Beta-2-microglobulin (mg/L): 3.5, Albumin (g/dL): 3.4, ISS: Stage II, High-risk cytogenetics: Absent, LDH: Not assessed - Signed by Baird [facility], PA-C  on 05/03/2017    INTERVAL HISTORY:  Johnny Lucas 72 y.o. male returns for routine follow-up for multiple myeloma and metastatic breast cancer.   Here today with his wife. He is here for post-hospitalization follow-up. He will be due for his next cycle of treatment next week.   After last treatment, he had severe chills and fever; he felt very weak and fatigue at home. He presented to ED on 04/20/18 and was hospitalized.  Received 2 units PRBCs in hospital.   Endorses occasional nausea, "but not bad enough to take a pill for it or anything."  Denies  vomiting or fevers since discharge.  He feels like his strength is improving. Notes mild BLE swelling.       REVIEW OF SYSTEMS:  Review of Systems  Constitutional: Positive for fatigue.  Gastrointestinal: Positive for nausea.  All other systems reviewed and are negative.    PAST MEDICAL/SURGICAL HISTORY:  Past Medical History:  Diagnosis Date  . Anxiety   . Bone metastases ([place]) 09/10/2016  . Breast cancer ([place]) 2011   Stave IV breast cancer; radiation and tamoxifen  . Breast cancer, male ([place])    Stave IV breast cancer; radiation and tamoxifen  Overview:  Left breast ca with mets bone  Overview:  METS TO BONE  . GERD (gastroesophageal reflux disease)   . Hypertension   . Macular degeneration   . Multiple myeloma ([place]) 02/18/2017   Past Surgical History:  Procedure Laterality Date  . BACK SURGERY    . HERNIA REPAIR    . right arm surgery       SOCIAL HISTORY:  Social History   Socioeconomic History  . Marital status: Married    Spouse name: Not on file  . Number of children: 3  . Years of education: Not on file  . Highest education level: Not on file  Occupational History  . Not on file  Social Needs  . Financial resource strain: Not on file  . Food insecurity:    Worry: Not on file    Inability: Not on file  . Transportation needs:    Medical: Not on file    Non-medical: Not on file  Tobacco Use  . Smoking status: Never Smoker  . Smokeless tobacco: Former Network engineer and Sexual Activity  . Alcohol use: Yes    Alcohol/week: 14.4 oz    Types: 24 Cans of beer per week  . Drug use: No  . Sexual activity: Not on file  Lifestyle  . Physical activity:    Days per week: Not on file    Minutes per session: Not on file  . Stress: Not on file  Relationships  . Social connections:    Talks on phone: Not on file    Gets together: Not on file    Attends religious service: Not on file    Active member of club or organization: Not on file    Attends  meetings of clubs or organizations: Not on file    Relationship status: Not on file  . Intimate partner violence:    Fear of current or ex partner: Not on file    Emotionally abused: Not on file    Physically abused: Not on file    Forced sexual activity: Not on file  Other Topics Concern  . Not on file  Social History Narrative  . Not on file    FAMILY HISTORY:  Family History  Problem Relation Age of Onset  . Stroke Mother   .  Cancer Maternal Aunt        cancer NOS; died in her 30s  . Lung cancer Maternal Uncle        smoker    CURRENT MEDICATIONS:  Outpatient Encounter Medications as of 04/28/2018  Medication Sig Note  . acyclovir (ZOVIRAX) 400 MG tablet Take 400 mg by mouth 2 (two) times daily.   Marland Kitchen albuterol (PROVENTIL HFA;VENTOLIN HFA) 108 (90 Base) MCG/ACT inhaler Inhale 2 puffs into the lungs every 6 (six) hours as needed for wheezing or shortness of breath.   . ALPRAZolam (XANAX) 0.5 MG tablet TAKE ONE TABLET BY MOUTH AT BEDTIME AS NEEDED FOR ANXIETY (Patient taking differently: TAKE ONE TABLET BY MOUTH AT BEDTIME)   . aspirin EC 81 MG tablet Take 162 mg by mouth daily.  09/10/2016: Received from: [facility]: Take 162 mg by mouth daily.  . calcium-vitamin D (OSCAL WITH D) 500-200 MG-UNIT TABS tablet TAKE TWO TABLETS BY MOUTH DAILY (Patient taking differently: TAKE TWO TABLETS BY MOUTH  TWICE DAILY) 02/18/2018: Terrilee Files   . Carfilzomib (KYPROLIS IV) Inject into the vein. Day 1 and 2, day 8 and 9, day 15 and 16 every 28 days for 2 months   . cyclophosphamide (CYTOXAN) 2 g chemo injection Inject into the vein once. Day 1, day 8, day 15, every 28 days for 2 months   . denosumab (XGEVA) 120 MG/1.7ML SOLN injection Inject 120 mg into the skin once.   . folic acid (FOLVITE) 1 MG tablet Take 1 mg by mouth daily.    Marland Kitchen gabapentin (NEURONTIN) 300 MG capsule Take two times a day, then increase to three times a day in a few weeks. (Patient taking differently: Take 300 mg  by mouth 2 (two) times daily. )   . guaiFENesin (MUCINEX) 600 MG 12 hr tablet Take 600 mg by mouth 2 (two) times daily as needed for cough.   . loratadine (CLARITIN) 10 MG tablet Take 1 tablet (10 mg total) by mouth daily.   . metoprolol succinate (TOPROL-XL) 50 MG 24 hr tablet Take 50 mg by mouth daily.    . Multiple Vitamin (MULTIVITAMIN WITH MINERALS) TABS tablet Take 1 tablet by mouth daily.   . ondansetron (ZOFRAN) 8 MG tablet Take 1 tablet (8 mg total) by mouth every 8 (eight) hours as needed for nausea or vomiting.   . pantoprazole (PROTONIX) 40 MG tablet Take 40 mg by mouth every evening.    . prochlorperazine (COMPAZINE) 10 MG tablet Take 1 tablet (10 mg total) by mouth every 6 (six) hours as needed for nausea or vomiting.   . tamoxifen (NOLVADEX) 20 MG tablet TAKE 1 TABLET EVERY DAY    No facility-administered encounter medications on file as of 04/28/2018.     ALLERGIES:  No Known Allergies   PHYSICAL EXAM:  ECOG Performance status:1  I have reviewed his vitals today. Physical Exam  Constitutional: He is well-developed, well-nourished, and in no distress.  Cardiovascular: Normal rate and regular rhythm.  Pulmonary/Chest: Effort normal and breath sounds normal. He has no wheezes.  Psychiatric: Mood, memory, affect and judgment normal.     LABORATORY DATA:  I have reviewed the labs as listed.  CBC    Component Value Date/Time   WBC 5.3 04/28/2018 0853   RBC 2.41 (L) 04/28/2018 0853   HGB 8.3 (L) 04/28/2018 0853   HCT 24.6 (L) 04/28/2018 0853   PLT 99 (L) 04/28/2018 0853   MCV 102.1 (H) 04/28/2018 0853   MCH 34.4 (  H) 04/28/2018 0853   MCHC 33.7 04/28/2018 0853   RDW 19.3 (H) 04/28/2018 0853   LYMPHSABS 0.4 (L) 04/22/2018 0423   MONOABS 0.8 04/22/2018 0423   EOSABS 0.2 04/22/2018 0423   BASOSABS 0.0 04/22/2018 0423   CMP Latest Ref Rng & Units 04/28/2018 04/22/2018 04/21/2018  Glucose 65 - 99 mg/dL 101(H) 110(H) 115(H)  BUN 6 - 20 mg/dL 19 26(H) 22(H)  Creatinine  0.61 - 1.24 mg/dL 1.58(H) 2.53(H) 2.41(H)  Sodium 135 - 145 mmol/L 140 141 139  Potassium 3.5 - 5.1 mmol/L 3.9 3.9 3.9  Chloride 101 - 111 mmol/L 107 110 108  CO2 22 - 32 mmol/L 24 23 19(L)  Calcium 8.9 - 10.3 mg/dL 9.0 8.5(L) 8.0(L)  Total Protein 6.5 - 8.1 g/dL 5.5(L) 4.8(L) -  Total Bilirubin 0.3 - 1.2 mg/dL 0.7 1.0 -  Alkaline Phos 38 - 126 U/L 23(L) 22(L) -  AST 15 - 41 U/L 17 15 -  ALT 17 - 63 U/L 13(L) 13(L) -       ASSESSMENT & PLAN:   Multiple myeloma ([name] 1.  Stage II IgG kappa multiple myeloma (Dx February 2018): -RVD from 03/01/2017 through 06/29/2017 -Chemotherapy changed to 3 cycles of CCyD from 07/12/2017-04/2017 to obtain deeper response - Status post auto stem cell transplant on 10/08/2017 -Stable M spike after transplant, started on CCyD, cycle 1 on 02/14/2018, cycle 2 on 03/14/2018 -His M spike after 2 cycles was undetectable, when measured at [facility] last week.  He was recommended to have maintenance with carfilzomib 70 mg/m square on days 1 and 15 every 28 days by Dr. Norma Fredrickson. -He received his first carfilzomib high-dose on 04/20/2018, developed fever with chills 3 hours later with weakness in the extremities.  He was admitted to the hospital, received 1 unit of blood transfusion.  Blood cultures turned out to be negative.  He was febrile after hospitalization and was sent home.  Today he feels better and his strength is back.  We have done blood work and his creatinine has come down to his baseline of 1.5.  However he remains anemic with hemoglobin of 8.3.  He also has macrocytosis.  We have ordered ferritin and iron panel.  Will follow up on it to see if he needs any parenteral iron.  We will see him back next week.  I will also contact Dr. Norma Fredrickson to cut back on the dose of carfilzomib.  2.  Metastatic left breast cancer to the bones (Dx 2011): He was initially treated with lumpectomy.  He was initiated on tamoxifen.  Tamoxifen was restarted back 30 days after his  bone marrow transplant.  3.  Peripheral neuropathy: He has tingling in the toes which has been stable.  He is taking gabapentin 300 mg twice a day.  4.  Infection prophylaxis: His Bactrim was discontinued.  He will continue acyclovir 400 mg twice daily.  5.  Hypertension: He is continuing metoprolol XL 50 mg daily.      Orders placed this encounter:  Orders Placed This Encounter  Procedures  . CBC  . Comprehensive metabolic panel  . Iron and TIBC  . Ferritin     This note includes documentation from Mike Craze, NP, who was present during this patient's office visit and evaluation.  I have reviewed this note for its completeness and accuracy.  I have edited this note accordingly based on my findings and medical opinion.      Derek Jack, MD [place] [phone]

## 2018-05-03 ENCOUNTER — Other Ambulatory Visit (HOSPITAL_COMMUNITY): Payer: Self-pay | Admitting: Adult Health

## 2018-05-04 ENCOUNTER — Other Ambulatory Visit (HOSPITAL_COMMUNITY): Payer: Self-pay

## 2018-05-04 DIAGNOSIS — C7951 Secondary malignant neoplasm of bone: Secondary | ICD-10-CM

## 2018-05-04 DIAGNOSIS — C50922 Malignant neoplasm of unspecified site of left male breast: Secondary | ICD-10-CM

## 2018-05-04 DIAGNOSIS — C9000 Multiple myeloma not having achieved remission: Secondary | ICD-10-CM

## 2018-05-04 DIAGNOSIS — Z170 Estrogen receptor positive status [ER+]: Secondary | ICD-10-CM

## 2018-05-04 DIAGNOSIS — C9001 Multiple myeloma in remission: Secondary | ICD-10-CM

## 2018-05-04 NOTE — ED Notes (Signed)
Date and time results received: 04/21/2018 2144 (use smartphrase ".now" to insert current time)  Test: lactic acid Critical Value: 2.8  Name of Provider Notified: long  Orders Received? Or Actions Taken?: n/a

## 2018-05-05 ENCOUNTER — Inpatient Hospital Stay (HOSPITAL_COMMUNITY): Payer: Medicare Other

## 2018-05-05 ENCOUNTER — Encounter (HOSPITAL_COMMUNITY): Payer: Self-pay

## 2018-05-05 VITALS — BP 145/75 | HR 53 | Temp 98.0°F | Resp 18 | Wt 203.6 lb

## 2018-05-05 DIAGNOSIS — C50922 Malignant neoplasm of unspecified site of left male breast: Secondary | ICD-10-CM | POA: Diagnosis not present

## 2018-05-05 DIAGNOSIS — C7951 Secondary malignant neoplasm of bone: Secondary | ICD-10-CM

## 2018-05-05 DIAGNOSIS — Z9484 Stem cells transplant status: Secondary | ICD-10-CM | POA: Diagnosis not present

## 2018-05-05 DIAGNOSIS — Z923 Personal history of irradiation: Secondary | ICD-10-CM | POA: Diagnosis not present

## 2018-05-05 DIAGNOSIS — C9000 Multiple myeloma not having achieved remission: Secondary | ICD-10-CM

## 2018-05-05 DIAGNOSIS — Z170 Estrogen receptor positive status [ER+]: Secondary | ICD-10-CM

## 2018-05-05 DIAGNOSIS — Z5111 Encounter for antineoplastic chemotherapy: Secondary | ICD-10-CM | POA: Diagnosis not present

## 2018-05-05 LAB — COMPREHENSIVE METABOLIC PANEL
ALT: 14 U/L — AB (ref 17–63)
ANION GAP: 7 (ref 5–15)
AST: 19 U/L (ref 15–41)
Albumin: 3.7 g/dL (ref 3.5–5.0)
Alkaline Phosphatase: 25 U/L — ABNORMAL LOW (ref 38–126)
BUN: 14 mg/dL (ref 6–20)
CALCIUM: 8.9 mg/dL (ref 8.9–10.3)
CHLORIDE: 106 mmol/L (ref 101–111)
CO2: 27 mmol/L (ref 22–32)
CREATININE: 1.42 mg/dL — AB (ref 0.61–1.24)
GFR, EST AFRICAN AMERICAN: 56 mL/min — AB (ref >60)
GFR, EST NON AFRICAN AMERICAN: 49 mL/min — AB (ref >60)
Glucose, Bld: 102 mg/dL — ABNORMAL HIGH (ref 65–99)
Potassium: 4.1 mmol/L (ref 3.5–5.1)
Sodium: 140 mmol/L (ref 135–145)
Total Bilirubin: 0.5 mg/dL (ref 0.3–1.2)
Total Protein: 5.7 g/dL — ABNORMAL LOW (ref 6.5–8.1)

## 2018-05-05 LAB — CBC WITH DIFFERENTIAL/PLATELET
Basophils Absolute: 0 10*3/uL (ref 0.0–0.1)
Basophils Relative: 1 %
EOS PCT: 6 %
Eosinophils Absolute: 0.2 10*3/uL (ref 0.0–0.7)
HCT: 26.1 % — ABNORMAL LOW (ref 39.0–52.0)
Hemoglobin: 8.4 g/dL — ABNORMAL LOW (ref 13.0–17.0)
LYMPHS ABS: 0.6 10*3/uL — AB (ref 0.7–4.0)
LYMPHS PCT: 15 %
MCH: 33.7 pg (ref 26.0–34.0)
MCHC: 32.2 g/dL (ref 30.0–36.0)
MCV: 104.8 fL — ABNORMAL HIGH (ref 78.0–100.0)
MONOS PCT: 11 %
Monocytes Absolute: 0.5 10*3/uL (ref 0.1–1.0)
Neutro Abs: 2.8 10*3/uL (ref 1.7–7.7)
Neutrophils Relative %: 67 %
PLATELETS: 165 10*3/uL (ref 150–400)
RBC: 2.49 MIL/uL — AB (ref 4.22–5.81)
RDW: 16.9 % — ABNORMAL HIGH (ref 11.5–15.5)
WBC: 4.1 10*3/uL (ref 4.0–10.5)

## 2018-05-05 MED ORDER — PROCHLORPERAZINE MALEATE 10 MG PO TABS
10.0000 mg | ORAL_TABLET | Freq: Once | ORAL | Status: AC
  Administered 2018-05-05: 10 mg via ORAL
  Filled 2018-05-05: qty 1

## 2018-05-05 MED ORDER — SODIUM CHLORIDE 0.9 % IV SOLN
Freq: Once | INTRAVENOUS | Status: AC
  Administered 2018-05-05: 12:00:00 via INTRAVENOUS

## 2018-05-05 MED ORDER — DEXTROSE 5 % IV SOLN
34.0000 mg/m2 | Freq: Once | INTRAVENOUS | Status: AC
  Administered 2018-05-05: 70 mg via INTRAVENOUS
  Filled 2018-05-05: qty 5

## 2018-05-05 MED ORDER — SODIUM CHLORIDE 0.9 % IV SOLN
Freq: Once | INTRAVENOUS | Status: AC
  Administered 2018-05-05: 13:00:00 via INTRAVENOUS

## 2018-05-05 NOTE — Progress Notes (Signed)
Johnny Lucas tolerated Carfilzomib infusion well without complaints or incident. Labs reviewed with Dr. Delton Coombes prior to administering this medication. Peripheral IV site checked for blood return prior to, during and after Carfilzomib infusion. VSS upon discharge. Pt discharged self ambulatory in satisfactory condition accompanied by his wife

## 2018-05-05 NOTE — Patient Instructions (Signed)
[facility] Discharge Instructions for Patients Receiving Chemotherapy   Beginning January 23rd 2017 lab work for the [facility] will be done in the  Main lab at [facility] on 1st floor. If you have a lab appointment with the [facility] please come in thru the  Main Entrance and check in at the main information desk   Today you received the following chemotherapy agents Carfilzomib. Follow-up as scheduled. Call clinic for any questions or concerns  To help prevent nausea and vomiting after your treatment, we encourage you to take your nausea medication   If you develop nausea and vomiting, or diarrhea that is not controlled by your medication, call the clinic.  The clinic phone number is [phone]. Office hours are Monday-Friday 8:30am-5:00pm.  BELOW ARE SYMPTOMS THAT SHOULD BE REPORTED IMMEDIATELY:  *FEVER GREATER THAN 101.0 F  *CHILLS WITH OR WITHOUT FEVER  NAUSEA AND VOMITING THAT IS NOT CONTROLLED WITH YOUR NAUSEA MEDICATION  *UNUSUAL SHORTNESS OF BREATH  *UNUSUAL BRUISING OR BLEEDING  TENDERNESS IN MOUTH AND THROAT WITH OR WITHOUT PRESENCE OF ULCERS  *URINARY PROBLEMS  *BOWEL PROBLEMS  UNUSUAL RASH Items with * indicate a potential emergency and should be followed up as soon as possible. If you have an emergency after office hours please contact your primary care physician or go to the nearest emergency department.  Please call the clinic during office hours if you have any questions or concerns.   You may also contact the Patient Navigator at [phone] should you have any questions or need assistance in obtaining follow up care.      Resources For Cancer Patients and their Caregivers ? American Cancer Society: Can assist with transportation, wigs, general needs, runs Look Good Feel Better.        940-802-5739 ? Cancer Care: Provides financial assistance, online support groups, medication/co-pay assistance.  1-800-813-HOPE  (323) 704-1322) ? [facility] Assists [place] cancer patients and their families through emotional , educational and financial support.  626-788-6542 ? [place] DSS Where to apply for food stamps, Medicaid and utility assistance. (807)495-5959 ? [facility]: Transportation to medical appointments. (939)470-3085 ? Social Security Administration: May apply for disability if have a Stage IV cancer. 909-606-1337 606-213-8736 ? [employer], Disability and Transit Services: Assists with nutrition, care and transit needs. (782)631-8605

## 2018-05-16 ENCOUNTER — Inpatient Hospital Stay (HOSPITAL_COMMUNITY): Payer: Medicare Other

## 2018-05-16 ENCOUNTER — Encounter (HOSPITAL_COMMUNITY): Payer: Self-pay

## 2018-05-16 VITALS — BP 150/74 | HR 63 | Temp 98.3°F | Resp 18 | Wt 199.0 lb

## 2018-05-16 DIAGNOSIS — C7951 Secondary malignant neoplasm of bone: Secondary | ICD-10-CM | POA: Diagnosis not present

## 2018-05-16 DIAGNOSIS — Z9484 Stem cells transplant status: Secondary | ICD-10-CM | POA: Diagnosis not present

## 2018-05-16 DIAGNOSIS — C50922 Malignant neoplasm of unspecified site of left male breast: Secondary | ICD-10-CM | POA: Diagnosis not present

## 2018-05-16 DIAGNOSIS — C9000 Multiple myeloma not having achieved remission: Secondary | ICD-10-CM

## 2018-05-16 DIAGNOSIS — Z5111 Encounter for antineoplastic chemotherapy: Secondary | ICD-10-CM | POA: Diagnosis not present

## 2018-05-16 DIAGNOSIS — Z923 Personal history of irradiation: Secondary | ICD-10-CM | POA: Diagnosis not present

## 2018-05-16 DIAGNOSIS — C9001 Multiple myeloma in remission: Secondary | ICD-10-CM

## 2018-05-16 LAB — CBC
HEMATOCRIT: 28.7 % — AB (ref 39.0–52.0)
HEMOGLOBIN: 9.3 g/dL — AB (ref 13.0–17.0)
MCH: 33.7 pg (ref 26.0–34.0)
MCHC: 32.4 g/dL (ref 30.0–36.0)
MCV: 104 fL — ABNORMAL HIGH (ref 78.0–100.0)
Platelets: 149 10*3/uL — ABNORMAL LOW (ref 150–400)
RBC: 2.76 MIL/uL — AB (ref 4.22–5.81)
RDW: 16 % — ABNORMAL HIGH (ref 11.5–15.5)
WBC: 4.9 10*3/uL (ref 4.0–10.5)

## 2018-05-16 LAB — COMPREHENSIVE METABOLIC PANEL
ALBUMIN: 3.8 g/dL (ref 3.5–5.0)
ALK PHOS: 29 U/L — AB (ref 38–126)
ALT: 28 U/L (ref 17–63)
AST: 24 U/L (ref 15–41)
Anion gap: 5 (ref 5–15)
BILIRUBIN TOTAL: 0.5 mg/dL (ref 0.3–1.2)
BUN: 19 mg/dL (ref 6–20)
CALCIUM: 9 mg/dL (ref 8.9–10.3)
CO2: 28 mmol/L (ref 22–32)
Chloride: 106 mmol/L (ref 101–111)
Creatinine, Ser: 1.45 mg/dL — ABNORMAL HIGH (ref 0.61–1.24)
GFR calc Af Amer: 55 mL/min — ABNORMAL LOW (ref >60)
GFR calc non Af Amer: 47 mL/min — ABNORMAL LOW (ref >60)
GLUCOSE: 117 mg/dL — AB (ref 65–99)
Potassium: 4 mmol/L (ref 3.5–5.1)
SODIUM: 139 mmol/L (ref 135–145)
TOTAL PROTEIN: 5.9 g/dL — AB (ref 6.5–8.1)

## 2018-05-16 MED ORDER — DENOSUMAB 120 MG/1.7ML ~~LOC~~ SOLN
120.0000 mg | Freq: Once | SUBCUTANEOUS | Status: AC
  Administered 2018-05-16: 120 mg via SUBCUTANEOUS
  Filled 2018-05-16: qty 1.7

## 2018-05-16 NOTE — Patient Instructions (Signed)
Johnny Lucas at [facility]  Discharge Instructions:  You received xgeva today. _______________________________________________________________  Thank you for choosing [place] at [facility] to provide your oncology and hematology care.  To afford each patient quality time with our providers, please arrive at least 15 minutes before your scheduled appointment.  You need to re-schedule your appointment if you arrive 10 or more minutes late.  We strive to give you quality time with our providers, and arriving late affects you and other patients whose appointments are after yours.  Also, if you no show three or more times for appointments you may be dismissed from the clinic.  Again, thank you for choosing [facility] at [place] hope is that these requests will allow you access to exceptional care and in a timely manner. _______________________________________________________________  If you have questions after your visit, please contact our office at [phone] between the hours of 8:30 a.m. and 5:00 p.m. Voicemails left after 4:30 p.m. will not be returned until the following business day. _______________________________________________________________  For prescription refill requests, have your pharmacy contact our office. _______________________________________________________________  Recommendations made by the consultant and any test results will be sent to your referring physician. _______________________________________________________________

## 2018-05-16 NOTE — Progress Notes (Signed)
Patient for xgeva shot today.  Taking calcium as directed.  Denied tooth, jaw, or leg pain.  Patient tolerated shot with no complaints of pain.  Injectio site clean and dry with no bruising or swelling noted at site.  VSs with discharge and left ambulatory with family.  No s/s of distress noted.

## 2018-05-17 LAB — KAPPA/LAMBDA LIGHT CHAINS
KAPPA FREE LGHT CHN: 13.6 mg/L (ref 3.3–19.4)
Kappa, lambda light chain ratio: 4.39 — ABNORMAL HIGH (ref 0.26–1.65)
Lambda free light chains: 3.1 mg/L — ABNORMAL LOW (ref 5.7–26.3)

## 2018-05-18 LAB — PROTEIN ELECTROPHORESIS, SERUM
A/G Ratio: 2 — ABNORMAL HIGH (ref 0.7–1.7)
Albumin ELP: 3.6 g/dL (ref 2.9–4.4)
Alpha-1-Globulin: 0.3 g/dL (ref 0.0–0.4)
Alpha-2-Globulin: 0.5 g/dL (ref 0.4–1.0)
Beta Globulin: 0.7 g/dL (ref 0.7–1.3)
GAMMA GLOBULIN: 0.3 g/dL — AB (ref 0.4–1.8)
GLOBULIN, TOTAL: 1.8 g/dL — AB (ref 2.2–3.9)
M-SPIKE, %: 0.2 g/dL — AB
TOTAL PROTEIN ELP: 5.4 g/dL — AB (ref 6.0–8.5)

## 2018-05-18 NOTE — Progress Notes (Signed)
[address] [place], [place] [address]   CLINIC:  Medical Oncology/Hematology  PCP:  [name], MD [place] [address] [phone]   REASON FOR VISIT:  Follow-up for Multiple myeloma treatment s/p stem cell transplant   CURRENT THERAPY: Cytoxan/Carfilzomib/Decadron  BRIEF ONCOLOGIC HISTORY:    Breast cancer, male ([place])   12/31/2009 Initial Biopsy    Biopsy of L breast       12/31/2009 Pathology Results    Invasive ductal carcinoma, ER/PR+, HER 2 negative      12/31/2009 Imaging    Ultrasound showing a 2.43 x 1.85 x 3 cm hypoechoic spiculated mass in the 12 o clock L breast retroareolar region      01/01/2010 -  Anti-estrogen oral therapy    Tamoxifen 20 mg daily      01/06/2010 Imaging    Bone scan abnormal uptake in the diaphysis of the R humerus, abnormal in the R third, fifth and sixth ribs, lesion also noted in the sternum.      02/03/2010 Surgery    Rod placement and fixation of R humerus by Dr. [name]      02/05/2010 - 02/18/2010 Radiation Therapy    30Gy in 10 fractions of 3 Gy per fraction to R pathologic fracture      03/11/2010 -  Chemotherapy    Denosumab monthly, now every 3 months. Started at [facility]       06/09/2016 Imaging    Three hypermetabolic osseous lesions in the sternum, left ilium and right ilium, as discussed above, likely represent osseous metastases. At this time, these are not recognizable on the CT images. 2. No extra skeletal metastatic disease identified in the neck, chest, abdomen or pelvis.      10/13/2016 Progression    PET shows various new and enlarging osseous metastatic lesions with no definite extra osseous metastatic disease currently identified.       12/31/2016 Progression    1. Multifocal hypermetabolic osseous metastases throughout the axial and proximal appendicular skeleton, which are increased in size, number and metabolism since 10/13/2016 PET-CT. 2. 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 3. No additional sites of hypermetabolic metastatic disease. 4. Chronic right mastoid sinusitis. 5. Aortic atherosclerosis.  One vessel coronary atherosclerosis.       Multiple myeloma ([place])   02/12/2017 Bone Marrow Biopsy    The marrow was variably cellular with large peritrabecular aggregates of kappa restricted plasma cells (66% by aspirate, 30% by Cd138). Cytogenetics +11.       03/01/2017 - 06/29/2017 Chemotherapy    RVD       05/26/2017 Bone Marrow Biopsy    Performed at [facility]:  Plasma cell myeloma in a 30% cellularmarrow with decreased trilineage hematopoiesis and 42% kappalight chain restricted plasma cells on the aspirate smears andlarge aggregates on the core biopsy.       07/12/2017 - 09/01/2017 Chemotherapy    3 cycles of carfizolmib/cyclophosphamide/dexamethasone        10/08/2017 Bone Marrow Transplant    Autotransplant at [place]: Cancer Staging Multiple myeloma [facility]) Staging form: Plasma Cell Myeloma and Plasma Cell Disorders, AJCC 8th Edition - Clinical stage from 05/03/2017: Beta-2-microglobulin (mg/L): 3.5, Albumin (g/dL): 3.4, ISS: Stage II, High-risk cytogenetics: Absent, LDH: Not assessed - Signed by Baird [facility], PA-C  on 05/03/2017    INTERVAL HISTORY:  Johnny Lucas 72 y.o. male returns for routine follow-up for multiple myeloma and metastatic breast cancer.   Due for day 2, cycle 1 of Carfilzomib today.    With his last cycle of chemo (which was dose-reduced), reports fever up to 100, chills, and fatigue.  This occurred a few hours after chemotherapy; symptoms resolved the following day. He has had no recurrent symptoms since that time. Denies blood in his stools. Continues to have peripheral neuropathy, which is stable.  Remains on gabapentin.      Recommended  he resume vitamin B12 1000 mcg daily.  Continue folic acid.    He is concerned about a "spot" on his left upper inner arm. He noticed the lesion about 1 month ago; it has not healed and is sore.  He does not have a dermatologist.   Remains on Tamoxifen; continues to tolerate well.      REVIEW OF SYSTEMS:  Review of Systems  Constitutional: Positive for chills and fever.  Neurological: Positive for numbness.  All other systems reviewed and are negative.    PAST MEDICAL/SURGICAL HISTORY:  Past Medical History:  Diagnosis Date  . Anxiety   . Bone metastases ([place]) 09/10/2016  . Breast cancer ([place]) 2011   Stave IV breast cancer; radiation and tamoxifen  . Breast cancer, male ([place])    Stave IV breast cancer; radiation and tamoxifen  Overview:  Left breast ca with mets bone  Overview:  METS TO BONE  . GERD (gastroesophageal reflux disease)   . Hypertension   . Macular degeneration   . Multiple myeloma ([place]) 02/18/2017   Past Surgical History:  Procedure Laterality Date  . BACK SURGERY    . HERNIA REPAIR    . right arm surgery       SOCIAL HISTORY:  Social History   Socioeconomic History  . Marital status: Married    Spouse name: Not on file  . Number of children: 3  . Years of education: Not on file  . Highest education level: Not on file  Occupational History  . Not on file  Social Needs  . Financial resource strain: Not on file  . Food insecurity:    Worry: Not on file    Inability: Not on file  . Transportation needs:    Medical: Not on file    Non-medical: Not on file  Tobacco Use  . Smoking status: Never Smoker  . Smokeless tobacco: Former Network engineer and Sexual Activity  . Alcohol use: Yes    Alcohol/week: 14.4 oz    Types: 24 Cans of beer per week  . Drug use: No  . Sexual activity: Not on file  Lifestyle  . Physical activity:    Days per week: Not on file    Minutes per session: Not on file  . Stress: Not on file  Relationships  . Social  connections:    Talks on phone: Not on file    Gets together: Not on file    Attends religious service: Not on file    Active member of club or organization: Not on file    Attends meetings of clubs or organizations: Not on file    Relationship status: Not on file  . Intimate partner violence:    Fear of current or ex partner: Not on file    Emotionally abused: Not on file    Physically abused: Not on file    Forced sexual activity: Not  on file  Other Topics Concern  . Not on file  Social History Narrative  . Not on file    FAMILY HISTORY:  Family History  Problem Relation Age of Onset  . Stroke Mother   . Cancer Maternal Aunt        cancer NOS; died in her 22s  . Lung cancer Maternal Uncle        smoker    CURRENT MEDICATIONS:  Outpatient Encounter Medications as of 05/19/2018  Medication Sig Note  . acyclovir (ZOVIRAX) 400 MG tablet Take 400 mg by mouth 2 (two) times daily.   [place] Kitchen albuterol (PROVENTIL HFA;VENTOLIN HFA) 108 (90 Base) MCG/ACT inhaler Inhale 2 puffs into the lungs every 6 (six) hours as needed for wheezing or shortness of breath.   . ALPRAZolam (XANAX) 0.5 MG tablet TAKE ONE TABLET BY MOUTH AT BEDTIME AS NEEDED FOR ANXIETY (Patient taking differently: TAKE ONE TABLET BY MOUTH AT BEDTIME)   . aspirin EC 81 MG tablet Take 162 mg by mouth daily.  09/10/2016: Received from: [facility]: Take 162 mg by mouth daily.  . calcium-vitamin D (OSCAL WITH D) 500-200 MG-UNIT TABS tablet TAKE TWO TABLETS BY MOUTH DAILY   . Carfilzomib (KYPROLIS IV) Inject into the vein. Day 1 and 2, day 8 and 9, day 15 and 16 every 28 days for 2 months   . cyclophosphamide (CYTOXAN) 2 g chemo injection Inject into the vein once. Day 1, day 8, day 15, every 28 days for 2 months   . denosumab (XGEVA) 120 MG/1.7ML SOLN injection Inject 120 mg into the skin once.   . folic acid (FOLVITE) 1 MG tablet Take 1 mg by mouth daily.    Marland Kitchen gabapentin (NEURONTIN) 300 MG capsule Take two times a  day, then increase to three times a day in a few weeks. (Patient taking differently: Take 300 mg by mouth 2 (two) times daily. )   . guaiFENesin (MUCINEX) 600 MG 12 hr tablet Take 600 mg by mouth 2 (two) times daily as needed for cough.   . loratadine (CLARITIN) 10 MG tablet Take 1 tablet (10 mg total) by mouth daily.   . metoprolol succinate (TOPROL-XL) 50 MG 24 hr tablet Take 50 mg by mouth daily.    . Multiple Vitamin (MULTIVITAMIN WITH MINERALS) TABS tablet Take 1 tablet by mouth daily.   . ondansetron (ZOFRAN) 8 MG tablet Take 1 tablet (8 mg total) by mouth every 8 (eight) hours as needed for nausea or vomiting.   . pantoprazole (PROTONIX) 40 MG tablet Take 40 mg by mouth every evening.    . prochlorperazine (COMPAZINE) 10 MG tablet Take 1 tablet (10 mg total) by mouth every 6 (six) hours as needed for nausea or vomiting.   . tamoxifen (NOLVADEX) 20 MG tablet TAKE 1 TABLET EVERY DAY    No facility-administered encounter medications on file as of 05/19/2018.     ALLERGIES:  No Known Allergies   PHYSICAL EXAM:  ECOG Performance status:1  I have reviewed his vitals today. Physical Exam  deferred LABORATORY DATA:  I have reviewed the labs as listed.  CBC    Component Value Date/Time   WBC 4.9 05/16/2018 0914   RBC 2.76 (L) 05/16/2018 0914   HGB 9.3 (L) 05/16/2018 0914   HCT 28.7 (L) 05/16/2018 0914   PLT 149 (L) 05/16/2018 0914   MCV 104.0 (H) 05/16/2018 0914   MCH 33.7 05/16/2018 0914   MCHC 32.4 05/16/2018 0914   RDW  16.0 (H) 05/16/2018 0914   LYMPHSABS 0.6 (L) 05/05/2018 1026   MONOABS 0.5 05/05/2018 1026   EOSABS 0.2 05/05/2018 1026   BASOSABS 0.0 05/05/2018 1026   CMP Latest Ref Rng & Units 05/16/2018 05/05/2018 04/28/2018  Glucose 65 - 99 mg/dL 117(H) 102(H) 101(H)  BUN 6 - 20 mg/dL '19 14 19  '$ Creatinine 0.61 - 1.24 mg/dL 1.45(H) 1.42(H) 1.58(H)  Sodium 135 - 145 mmol/L 139 140 140  Potassium 3.5 - 5.1 mmol/L 4.0 4.1 3.9  Chloride 101 - 111 mmol/L 106 106 107  CO2 22  - 32 mmol/L '28 27 24  '$ Calcium 8.9 - 10.3 mg/dL 9.0 8.9 9.0  Total Protein 6.5 - 8.1 g/dL 5.9(L) 5.7(L) 5.5(L)  Total Bilirubin 0.3 - 1.2 mg/dL 0.5 0.5 0.7  Alkaline Phos 38 - 126 U/L 29(L) 25(L) 23(L)  AST 15 - 41 U/L '24 19 17  '$ ALT 17 - 63 U/L 28 14(L) 13(L)       ASSESSMENT & PLAN:   Multiple myeloma ([name] 1.  Stage II IgG kappa multiple myeloma (Dx February 2018): -RVD from 03/01/2017 through 06/29/2017 -Chemotherapy changed to 3 cycles of CCyD from 07/12/2017-04/2017 to obtain deeper response - Status post auto stem cell transplant on 10/08/2017 -Stable M spike after transplant, started on CCyD, cycle 1 on 02/14/2018, cycle 2 on 03/14/2018 -His M spike after 2 cycles was undetectable, when measured at [facility] last week.  He was recommended to have maintenance with carfilzomib 70 mg/m square on days 1 and 15 every 28 days by Dr. Norma Fredrickson. -He received his first carfilzomib '70mg'$ /m2 on 04/20/2018, developed fever with chills 3 hours later with weakness in the extremities.  He was admitted to the hospital, received 1 unit of blood transfusion.  Blood cultures turned out to be negative.  He was febrile after hospitalization and was sent home. - He received carfilzomib 35 mg/m 2 weeks ago.  He experienced fever of 100, some chills and nausea and fatigue on the same day.  Next day he felt better.  I have reviewed the results of SPEP which shows 0.2 g/dL of monoclonal protein which is stable.  He will continue the same dose of carfilzomib today.  We will reassess him in 4 weeks.  For his anemia, I have suggested him to start taking B12 daily.  His B12 was borderline at 300 when checked in December 2018.  His ferritin and iron panel was within normal limits.  2.  Metastatic left breast cancer to the bones (Dx 2011): He was initially treated with lumpectomy.  He was initiated on tamoxifen.  Tamoxifen was restarted back 30 days after his bone marrow transplant.  3.  Peripheral neuropathy: He has  tingling in the toes which has been stable.  He is taking gabapentin 300 mg twice a day.  4.  Infection prophylaxis: His Bactrim was discontinued.  He will continue acyclovir 400 mg twice daily.  5.  Hypertension: He is continuing metoprolol XL 50 mg daily.      Orders placed this encounter:  No orders of the defined types were placed in this encounter.    This note includes documentation from Mike Craze, NP, who was present during this patient's office visit and evaluation.  I have reviewed this note for its completeness and accuracy.  I have edited this note accordingly based on my findings and medical opinion.      Derek Jack, MD [place] [phone]

## 2018-05-19 ENCOUNTER — Inpatient Hospital Stay (HOSPITAL_COMMUNITY): Payer: Medicare Other

## 2018-05-19 ENCOUNTER — Encounter (HOSPITAL_COMMUNITY): Payer: Self-pay | Admitting: Hematology

## 2018-05-19 ENCOUNTER — Inpatient Hospital Stay (HOSPITAL_BASED_OUTPATIENT_CLINIC_OR_DEPARTMENT_OTHER): Payer: Medicare Other | Admitting: Hematology

## 2018-05-19 ENCOUNTER — Encounter (HOSPITAL_COMMUNITY): Payer: Self-pay | Admitting: Lab

## 2018-05-19 ENCOUNTER — Other Ambulatory Visit: Payer: Self-pay

## 2018-05-19 VITALS — BP 155/72 | HR 60 | Temp 97.8°F | Resp 18 | Wt 201.6 lb

## 2018-05-19 DIAGNOSIS — C9000 Multiple myeloma not having achieved remission: Secondary | ICD-10-CM

## 2018-05-19 DIAGNOSIS — Z923 Personal history of irradiation: Secondary | ICD-10-CM | POA: Diagnosis not present

## 2018-05-19 DIAGNOSIS — D519 Vitamin B12 deficiency anemia, unspecified: Secondary | ICD-10-CM

## 2018-05-19 DIAGNOSIS — Z9484 Stem cells transplant status: Secondary | ICD-10-CM | POA: Diagnosis not present

## 2018-05-19 DIAGNOSIS — Z9221 Personal history of antineoplastic chemotherapy: Secondary | ICD-10-CM | POA: Diagnosis not present

## 2018-05-19 DIAGNOSIS — C50922 Malignant neoplasm of unspecified site of left male breast: Secondary | ICD-10-CM | POA: Diagnosis not present

## 2018-05-19 DIAGNOSIS — C7951 Secondary malignant neoplasm of bone: Secondary | ICD-10-CM

## 2018-05-19 DIAGNOSIS — Z79811 Long term (current) use of aromatase inhibitors: Secondary | ICD-10-CM | POA: Diagnosis not present

## 2018-05-19 DIAGNOSIS — Z5111 Encounter for antineoplastic chemotherapy: Secondary | ICD-10-CM | POA: Diagnosis not present

## 2018-05-19 MED ORDER — ACETAMINOPHEN 325 MG PO TABS
ORAL_TABLET | ORAL | Status: AC
  Filled 2018-05-19: qty 2

## 2018-05-19 MED ORDER — PROCHLORPERAZINE MALEATE 10 MG PO TABS
ORAL_TABLET | ORAL | Status: AC
  Filled 2018-05-19: qty 1

## 2018-05-19 MED ORDER — CYANOCOBALAMIN 1000 MCG/ML IJ SOLN
1000.0000 ug | Freq: Once | INTRAMUSCULAR | Status: AC
  Administered 2018-05-19: 1000 ug via INTRAMUSCULAR
  Filled 2018-05-19: qty 1

## 2018-05-19 MED ORDER — DEXTROSE 5 % IV SOLN
34.0000 mg/m2 | Freq: Once | INTRAVENOUS | Status: AC
  Administered 2018-05-19: 70 mg via INTRAVENOUS
  Filled 2018-05-19: qty 5

## 2018-05-19 MED ORDER — PROCHLORPERAZINE MALEATE 10 MG PO TABS
10.0000 mg | ORAL_TABLET | Freq: Once | ORAL | Status: AC
  Administered 2018-05-19: 10 mg via ORAL

## 2018-05-19 MED ORDER — SODIUM CHLORIDE 0.9 % IV SOLN: Freq: Once | INTRAVENOUS | Status: DC

## 2018-05-19 MED ORDER — SODIUM CHLORIDE 0.9 % IV SOLN
Freq: Once | INTRAVENOUS | Status: AC
  Administered 2018-05-19: 11:00:00 via INTRAVENOUS

## 2018-05-19 MED ORDER — HEPARIN SOD (PORK) LOCK FLUSH 100 UNIT/ML IV SOLN
500.0000 [IU] | Freq: Once | INTRAVENOUS | Status: DC | PRN
  Filled 2018-05-19: qty 5

## 2018-05-19 MED ORDER — SODIUM CHLORIDE 0.9% FLUSH: 10.0000 mL | INTRAVENOUS | Status: DC | PRN

## 2018-05-19 MED ORDER — ACETAMINOPHEN 325 MG PO TABS
650.0000 mg | ORAL_TABLET | Freq: Once | ORAL | Status: AC
  Administered 2018-05-19: 650 mg via ORAL

## 2018-05-19 NOTE — Progress Notes (Unsigned)
Referral sent to Dr [facility]. Appt 5/29 @930 .  Pt aware

## 2018-05-19 NOTE — Assessment & Plan Note (Signed)
1.  Stage II IgG kappa multiple myeloma (Dx February 2018): -RVD from 03/01/2017 through 06/29/2017 -Chemotherapy changed to 3 cycles of CCyD from 07/12/2017-04/2017 to obtain deeper response - Status post auto stem cell transplant on 10/08/2017 -Stable M spike after transplant, started on CCyD, cycle 1 on 02/14/2018, cycle 2 on 03/14/2018 -His M spike after 2 cycles was undetectable, when measured at [facility] last week.  He was recommended to have maintenance with carfilzomib 70 mg/m square on days 1 and 15 every 28 days by Dr. Norma Fredrickson. -He received his first carfilzomib 66m/m2 on 04/20/2018, developed fever with chills 3 hours later with weakness in the extremities.  He was admitted to the hospital, received 1 unit of blood transfusion.  Blood cultures turned out to be negative.  He was febrile after hospitalization and was sent home. - He received carfilzomib 35 mg/m 2 weeks ago.  He experienced fever of 100, some chills and nausea and fatigue on the same day.  Next day he felt better.  I have reviewed the results of SPEP which shows 0.2 g/dL of monoclonal protein which is stable.  He will continue the same dose of carfilzomib today.  We will reassess him in 4 weeks.  For his anemia, I have suggested him to start taking B12 daily.  His B12 was borderline at 300 when checked in December 2018.  His ferritin and iron panel was within normal limits.  2.  Metastatic left breast cancer to the bones (Dx 2011): He was initially treated with lumpectomy.  He was initiated on tamoxifen.  Tamoxifen was restarted back 30 days after his bone marrow transplant.  3.  Peripheral neuropathy: He has tingling in the toes which has been stable.  He is taking gabapentin 300 mg twice a day.  4.  Infection prophylaxis: His Bactrim was discontinued.  He will continue acyclovir 400 mg twice daily.  5.  Hypertension: He is continuing metoprolol XL 50 mg daily.

## 2018-05-19 NOTE — Progress Notes (Signed)
Treatment given per orders. Patient tolerated it well without problems. Vitals stable and discharged home from clinic ambulatory. Follow up as scheduled.  

## 2018-05-25 DIAGNOSIS — D225 Melanocytic nevi of trunk: Secondary | ICD-10-CM | POA: Diagnosis not present

## 2018-05-25 DIAGNOSIS — L821 Other seborrheic keratosis: Secondary | ICD-10-CM | POA: Diagnosis not present

## 2018-05-25 DIAGNOSIS — X32XXXA Exposure to sunlight, initial encounter: Secondary | ICD-10-CM | POA: Diagnosis not present

## 2018-05-25 DIAGNOSIS — L570 Actinic keratosis: Secondary | ICD-10-CM | POA: Diagnosis not present

## 2018-05-25 DIAGNOSIS — C44629 Squamous cell carcinoma of skin of left upper limb, including shoulder: Secondary | ICD-10-CM | POA: Diagnosis not present

## 2018-06-02 ENCOUNTER — Inpatient Hospital Stay (HOSPITAL_COMMUNITY): Payer: Medicare Other

## 2018-06-02 ENCOUNTER — Other Ambulatory Visit (HOSPITAL_COMMUNITY): Payer: Self-pay | Admitting: Adult Health

## 2018-06-02 ENCOUNTER — Inpatient Hospital Stay (HOSPITAL_COMMUNITY): Payer: Medicare Other | Attending: Hematology

## 2018-06-02 DIAGNOSIS — Z79899 Other long term (current) drug therapy: Secondary | ICD-10-CM | POA: Diagnosis not present

## 2018-06-02 DIAGNOSIS — C9000 Multiple myeloma not having achieved remission: Secondary | ICD-10-CM

## 2018-06-02 DIAGNOSIS — Z5111 Encounter for antineoplastic chemotherapy: Secondary | ICD-10-CM | POA: Insufficient documentation

## 2018-06-02 LAB — COMPREHENSIVE METABOLIC PANEL
ALT: 14 U/L — AB (ref 17–63)
AST: 20 U/L (ref 15–41)
Albumin: 3.8 g/dL (ref 3.5–5.0)
Alkaline Phosphatase: 22 U/L — ABNORMAL LOW (ref 38–126)
Anion gap: 7 (ref 5–15)
BILIRUBIN TOTAL: 0.6 mg/dL (ref 0.3–1.2)
BUN: 16 mg/dL (ref 6–20)
CO2: 28 mmol/L (ref 22–32)
CREATININE: 1.55 mg/dL — AB (ref 0.61–1.24)
Calcium: 9.1 mg/dL (ref 8.9–10.3)
Chloride: 107 mmol/L (ref 101–111)
GFR calc Af Amer: 51 mL/min — ABNORMAL LOW (ref >60)
GFR, EST NON AFRICAN AMERICAN: 44 mL/min — AB (ref >60)
Glucose, Bld: 108 mg/dL — ABNORMAL HIGH (ref 65–99)
Potassium: 4.2 mmol/L (ref 3.5–5.1)
Sodium: 142 mmol/L (ref 135–145)
TOTAL PROTEIN: 5.9 g/dL — AB (ref 6.5–8.1)

## 2018-06-02 LAB — CBC WITH DIFFERENTIAL/PLATELET
BASOS ABS: 0 10*3/uL (ref 0.0–0.1)
Basophils Relative: 0 %
Eosinophils Absolute: 0.2 10*3/uL (ref 0.0–0.7)
Eosinophils Relative: 5 %
HEMATOCRIT: 29.7 % — AB (ref 39.0–52.0)
Hemoglobin: 9.7 g/dL — ABNORMAL LOW (ref 13.0–17.0)
Lymphocytes Relative: 15 %
Lymphs Abs: 0.7 10*3/uL (ref 0.7–4.0)
MCH: 34.6 pg — ABNORMAL HIGH (ref 26.0–34.0)
MCHC: 32.7 g/dL (ref 30.0–36.0)
MCV: 106.1 fL — AB (ref 78.0–100.0)
Monocytes Absolute: 0.4 10*3/uL (ref 0.1–1.0)
Monocytes Relative: 8 %
NEUTROS ABS: 3.6 10*3/uL (ref 1.7–7.7)
Neutrophils Relative %: 72 %
Platelets: 165 10*3/uL (ref 150–400)
RBC: 2.8 MIL/uL — AB (ref 4.22–5.81)
RDW: 14 % (ref 11.5–15.5)
WBC: 5 10*3/uL (ref 4.0–10.5)

## 2018-06-02 NOTE — Progress Notes (Signed)
Johnny Lucas tolerated multiple unsuccessful peripheral IV attempts well without complaints. Dr. Delton Coombes was informed of this as well as pt's recent tick bite to his right neck area which was very swollen for 24 hrs after they removed it. He was also informed about pt's recent N+V and diarrhea that lasted about 5 hrs. Dr. Delton Coombes recommended that Johnny Lucas have a portacath placed and pt agreed. Appts made for port placement and next chemo tx. Pt discharged self ambulatory in satisfactory condition accompanied by his wife

## 2018-06-02 NOTE — Patient Instructions (Signed)
[facility] Discharge Instructions for Patients Receiving Chemotherapy   Beginning January 23rd 2017 lab work for the [facility] will be done in the  Main lab at [facility] on 1st floor. If you have a lab appointment with the [facility] please come in thru the  Main Entrance and check in at the main information desk   Today you received the following chemotherapy agents Carfilzomib. Follow-up as scheduled. Call clinic for any questions or complaints  To help prevent nausea and vomiting after your treatment, we encourage you to take your nausea medication   If you develop nausea and vomiting, or diarrhea that is not controlled by your medication, call the clinic.  The clinic phone number is [phone]. Office hours are Monday-Friday 8:30am-5:00pm.  BELOW ARE SYMPTOMS THAT SHOULD BE REPORTED IMMEDIATELY:  *FEVER GREATER THAN 101.0 F  *CHILLS WITH OR WITHOUT FEVER  NAUSEA AND VOMITING THAT IS NOT CONTROLLED WITH YOUR NAUSEA MEDICATION  *UNUSUAL SHORTNESS OF BREATH  *UNUSUAL BRUISING OR BLEEDING  TENDERNESS IN MOUTH AND THROAT WITH OR WITHOUT PRESENCE OF ULCERS  *URINARY PROBLEMS  *BOWEL PROBLEMS  UNUSUAL RASH Items with * indicate a potential emergency and should be followed up as soon as possible. If you have an emergency after office hours please contact your primary care physician or go to the nearest emergency department.  Please call the clinic during office hours if you have any questions or concerns.   You may also contact the Patient Navigator at [phone] should you have any questions or need assistance in obtaining follow up care.      Resources For Cancer Patients and their Caregivers ? American Cancer Society: Can assist with transportation, wigs, general needs, runs Look Good Feel Better.        (564)616-7432 ? Cancer Care: Provides financial assistance, online support groups, medication/co-pay assistance.  1-800-813-HOPE  567-818-4136) ? [place] Assists [place] cancer patients and their families through emotional , educational and financial support.  270-720-4742 ? [place] DSS Where to apply for food stamps, Medicaid and utility assistance. 903-241-7193 ? [facility]: Transportation to medical appointments. 602 277 2081 ? Social Security Administration: May apply for disability if have a Stage IV cancer. 9080608992 (830) 472-6149 ? [employer], Disability and Transit Services: Assists with nutrition, care and transit needs. [phone]

## 2018-06-06 ENCOUNTER — Inpatient Hospital Stay (HOSPITAL_COMMUNITY): Payer: Medicare Other

## 2018-06-06 ENCOUNTER — Other Ambulatory Visit (HOSPITAL_COMMUNITY): Payer: Medicare Other

## 2018-06-06 ENCOUNTER — Encounter (HOSPITAL_COMMUNITY): Payer: Self-pay

## 2018-06-06 ENCOUNTER — Ambulatory Visit (HOSPITAL_COMMUNITY): Payer: Medicare Other

## 2018-06-06 VITALS — BP 148/77 | HR 68 | Temp 98.0°F | Resp 18 | Wt 203.6 lb

## 2018-06-06 DIAGNOSIS — C9000 Multiple myeloma not having achieved remission: Secondary | ICD-10-CM

## 2018-06-06 DIAGNOSIS — Z79899 Other long term (current) drug therapy: Secondary | ICD-10-CM | POA: Diagnosis not present

## 2018-06-06 DIAGNOSIS — Z5111 Encounter for antineoplastic chemotherapy: Secondary | ICD-10-CM | POA: Diagnosis not present

## 2018-06-06 MED ORDER — SODIUM CHLORIDE 0.9 % IV SOLN
Freq: Once | INTRAVENOUS | Status: AC
Start: 2018-06-06 — End: 2018-06-06
  Administered 2018-06-06: 11:00:00 via INTRAVENOUS

## 2018-06-06 MED ORDER — DEXTROSE 5 % IV SOLN
34.0000 mg/m2 | Freq: Once | INTRAVENOUS | Status: AC
  Administered 2018-06-06: 70 mg via INTRAVENOUS
  Filled 2018-06-06: qty 5

## 2018-06-06 MED ORDER — PROCHLORPERAZINE MALEATE 10 MG PO TABS
ORAL_TABLET | ORAL | Status: AC
  Filled 2018-06-06: qty 1

## 2018-06-06 MED ORDER — ACETAMINOPHEN 325 MG PO TABS
ORAL_TABLET | ORAL | Status: AC
  Filled 2018-06-06: qty 2

## 2018-06-06 MED ORDER — ACETAMINOPHEN 325 MG PO TABS
650.0000 mg | ORAL_TABLET | Freq: Once | ORAL | Status: AC
  Administered 2018-06-06: 650 mg via ORAL

## 2018-06-06 MED ORDER — SODIUM CHLORIDE 0.9 % IV SOLN
Freq: Once | INTRAVENOUS | Status: AC
Start: 2018-06-06 — End: 2018-06-06
  Administered 2018-06-06: 10:00:00 via INTRAVENOUS

## 2018-06-06 MED ORDER — PROCHLORPERAZINE MALEATE 10 MG PO TABS
10.0000 mg | ORAL_TABLET | Freq: Once | ORAL | Status: AC
  Administered 2018-06-06: 10 mg via ORAL

## 2018-06-06 NOTE — Progress Notes (Signed)
0945 Labs reviewed from 06/02/18 and pt OK for Kyprolis infusion today                                                   1040 Hydration infused and peripheral IV site checked for blood return prior to starting his Kyprolis. 1149 Kyprolis was started 1200 Pt c/o burning at IV site  and redness noted directly around the IV site  and about 3-4 inches above the IV site without swelling. Kyprolis was stopped, pharmacy notified and IV site was checked for blood return with 0.8 ml of blood withdrawn from the peripheral IV. Ice applied to this area x 30 minutes per Pharmacist and IV cath was discontinued with band aid applied to site after MD made aware. Pt to have portacath placed this week with Chemo tx the following week per MD. IV site with less redness( approx. 2.5 cm and round) and very minimal discomfort per the pt upon discharge. Pt to call if either gets worse.Pt instructed to use cool compresses as needed. Pt discharged self ambulatory in satisfactory condition accompanied by his wife. We will call pt 06/07/18 for follow-up.

## 2018-06-06 NOTE — Patient Instructions (Addendum)
[facility] Discharge Instructions for Patients Receiving Chemotherapy   Beginning January 23rd 2017 lab work for the [facility] will be done in the  Main lab at [facility] on 1st floor. If you have a lab appointment with the [facility] please come in thru the  Main Entrance and check in at the main information desk   Today you did not receive your chemotherapy agent Kyprolis. Follow-up as scheduled. Call clinic for any questions or concerns  To help prevent nausea and vomiting after your treatment, we encourage you to take your nausea medication   If you develop nausea and vomiting, or diarrhea that is not controlled by your medication, call the clinic.  The clinic phone number is [phone]. Office hours are Monday-Friday 8:30am-5:00pm.  BELOW ARE SYMPTOMS THAT SHOULD BE REPORTED IMMEDIATELY:  *FEVER GREATER THAN 101.0 F  *CHILLS WITH OR WITHOUT FEVER  NAUSEA AND VOMITING THAT IS NOT CONTROLLED WITH YOUR NAUSEA MEDICATION  *UNUSUAL SHORTNESS OF BREATH  *UNUSUAL BRUISING OR BLEEDING  TENDERNESS IN MOUTH AND THROAT WITH OR WITHOUT PRESENCE OF ULCERS  *URINARY PROBLEMS  *BOWEL PROBLEMS  UNUSUAL RASH Items with * indicate a potential emergency and should be followed up as soon as possible. If you have an emergency after office hours please contact your primary care physician or go to the nearest emergency department.  Please call the clinic during office hours if you have any questions or concerns.   You may also contact the Patient Navigator at [phone] should you have any questions or need assistance in obtaining follow up care.      Resources For Cancer Patients and their Caregivers ? American Cancer Society: Can assist with transportation, wigs, general needs, runs Look Good Feel Better.        ([phone] ? Cancer Care: Provides financial assistance, online support groups, medication/co-pay assistance.  1-800-813-HOPE  (769) 030-8210) ? [place] Assists [place] cancer patients and their families through emotional , educational and financial support.  (430)018-2711 ? [place] DSS Where to apply for food stamps, Medicaid and utility assistance. 229-616-6545 ? [facility]: Transportation to medical appointments. 705-170-0334 ? Social Security Administration: May apply for disability if have a Stage IV cancer. 2526025590 936-862-7880 ? [employer], Disability and Transit Services: Assists with nutrition, care and transit needs. [phone]

## 2018-06-07 ENCOUNTER — Telehealth (HOSPITAL_COMMUNITY): Payer: Self-pay

## 2018-06-07 NOTE — Telephone Encounter (Signed)
F/U call from yesterday regarding IV site assessment. Pt denies any redness,edema or discomfort to right lower arm where peripheral IV site was located yesterday. Pt instructed to call if he has any problems and verbalized understanding

## 2018-06-09 ENCOUNTER — Ambulatory Visit (INDEPENDENT_AMBULATORY_CARE_PROVIDER_SITE_OTHER): Payer: Medicare Other | Admitting: General Surgery

## 2018-06-09 ENCOUNTER — Encounter (HOSPITAL_COMMUNITY): Payer: Self-pay

## 2018-06-09 ENCOUNTER — Observation Stay (HOSPITAL_COMMUNITY)
Admission: RE | Admit: 2018-06-09 | Discharge: 2018-06-09 | Disposition: A | Discharge status: Home / Self Care | Payer: Medicare Other | Source: Ambulatory Visit | Attending: General Surgery | Admitting: General Surgery

## 2018-06-09 ENCOUNTER — Encounter: Payer: Self-pay | Admitting: General Surgery

## 2018-06-09 VITALS — BP 155/88 | HR 75 | Temp 98.9°F | Resp 18 | Wt 201.0 lb

## 2018-06-09 DIAGNOSIS — C9000 Multiple myeloma not having achieved remission: Secondary | ICD-10-CM | POA: Diagnosis not present

## 2018-06-09 NOTE — H&P (Signed)
[facility] History and Physical  Reason for Referral: Port a catheter placement  Referring Physician: Dr. Tessie Eke Johnny Lucas is a 72 y.o. male.  HPI: Mr. Merlo is a 72 yo with Multiple myeloma on maintenance therapy twice per month per his report and prior bone marrow transplant at [facility] 09/2017.  He says he is doing fair and has been getting his therapy with a PIV but his veins are no longer tolerating this method. He is here for a port a catheter placement. In the past, he has had a tunneled CVL on the right it sounds like, but no implanted port.  He is right handed and has a right clavicular fracture from the distant past. He says he is otherwise well and has no complaints. He did have breast cancer on the left breast and adamantly denies any lumpectomy, but there is a scar consistent with this at the left breast.  He reports a needle biopsy.  He had no axillary surgery on the left.       Past Medical History:  Diagnosis Date  . Anxiety   . Bone metastases ([place]) 09/10/2016  . Breast cancer ([place]) 2011   Stave IV breast cancer; radiation and tamoxifen  . Breast cancer, male ([place])    Stave IV breast cancer; radiation and tamoxifen  Overview:  Left breast ca with mets bone  Overview:  METS TO BONE  . GERD (gastroesophageal reflux disease)   . Hypertension   . Macular degeneration   . Multiple myeloma ([place]) 02/18/2017         Past Surgical History:  Procedure Laterality Date  . BACK SURGERY    . HERNIA REPAIR    . right arm surgery           Family History  Problem Relation Age of Onset  . Stroke Mother   . Cancer Maternal Aunt        cancer NOS; died in her 30s  . Lung cancer Maternal Uncle        smoker    Social History        Tobacco Use  . Smoking status: Never Smoker  . Smokeless tobacco: Former Network engineer Use Topics  . Alcohol use: Yes    Alcohol/week: 14.4 oz    Types: 24 Cans of beer per  week  . Drug use: No    Medications: I have reviewed the patient's current medications. Allergies as of 06/09/2018   No Known Allergies              Medication List            Accurate as of 06/09/18 10:59 AM. Always use your most recent med list.           acyclovir 400 MG tablet Commonly known as:  ZOVIRAX Take 400 mg by mouth 2 (two) times daily.   albuterol 108 (90 Base) MCG/ACT inhaler Commonly known as:  PROVENTIL HFA;VENTOLIN HFA Inhale 2 puffs into the lungs every 6 (six) hours as needed for wheezing or shortness of breath.   ALPRAZolam 0.5 MG tablet Commonly known as:  XANAX TAKE 1 TABLET BY MOUTH AT BEDTIME AS NEEDED FOR ANXIETY   aspirin EC 81 MG tablet Take 162 mg by mouth daily.   calcium-vitamin D 500-200 MG-UNIT Tabs tablet Commonly known as:  OSCAL WITH D TAKE TWO TABLETS BY MOUTH DAILY   cyclophosphamide 2 g chemo injection Commonly known as:  CYTOXAN  Inject into the vein once. Day 1, day 8, day 15, every 28 days for 2 months   folic acid 1 MG tablet Commonly known as:  FOLVITE Take 1 mg by mouth daily.   gabapentin 300 MG capsule Commonly known as:  NEURONTIN Take two times a day, then increase to three times a day in a few weeks.   guaiFENesin 600 MG 12 hr tablet Commonly known as:  MUCINEX Take 600 mg by mouth 2 (two) times daily as needed for cough.   KYPROLIS IV Inject into the vein. Day 1 and 2, day 8 and 9, day 15 and 16 every 28 days for 2 months   loratadine 10 MG tablet Commonly known as:  CLARITIN Take 1 tablet (10 mg total) by mouth daily.   metoprolol succinate 50 MG 24 hr tablet Commonly known as:  TOPROL-XL Take 50 mg by mouth daily.   multivitamin with minerals Tabs tablet Take 1 tablet by mouth daily.   ondansetron 8 MG tablet Commonly known as:  ZOFRAN Take 1 tablet (8 mg total) by mouth every 8 (eight) hours as needed for nausea or vomiting.   pantoprazole 40 MG tablet Commonly known as:   PROTONIX Take 40 mg by mouth every evening.   prochlorperazine 10 MG tablet Commonly known as:  COMPAZINE Take 1 tablet (10 mg total) by mouth every 6 (six) hours as needed for nausea or vomiting.   tamoxifen 20 MG tablet Commonly known as:  NOLVADEX TAKE 1 TABLET EVERY DAY   XGEVA 120 MG/1.7ML Soln injection Generic drug:  denosumab Inject 120 mg into the skin once.        ROS:  A comprehensive review of systems was negative except for: Eyes: positive for blurry/ double vision Gastrointestinal: positive for reflux symptoms Musculoskeletal: positive for back pain, neck pain and stiff joints Neurological: positive for feet numbness Endocrine: positive for hot/ cold intolerance, and tired Allergic/Immunologic: positive for hay fever  Blood pressure (!) 155/88, pulse 75, temperature 98.9 F (37.2 C), temperature source Temporal, resp. rate 18, weight 201 lb (91.2 kg). Physical Exam  Constitutional: He is oriented to person, place, and time. He appears well-developed and well-nourished.  HENT:  Head: Normocephalic.  Eyes: Pupils are equal, round, and reactive to light. EOM are normal.  Neck: Normal range of motion. Neck supple.  Cardiovascular: Normal rate and regular rhythm.  Pulmonary/Chest: Effort normal and breath sounds normal.  No obvious scars on right chest, left breast scar above nipple, right clavicle with some deformity  Abdominal: Soft. He exhibits no distension. There is no tenderness.  Musculoskeletal: Normal range of motion. He exhibits no edema.  Neurological: He is alert and oriented to person, place, and time.  Skin: Skin is warm and dry.  Psychiatric: He has a normal mood and affect. His behavior is normal. Judgment and thought content normal.  Vitals reviewed.   Results: Results for TYMIER, LINDHOLM (MRN [id number]) as of 06/09/2018 16:34  Ref. Range 06/02/2018 10:35  WBC Latest Ref Range: 4.0 - 10.5 K/uL 5.0  RBC Latest Ref Range: 4.22 - 5.81  MIL/uL 2.80 (L)  Hemoglobin Latest Ref Range: 13.0 - 17.0 g/dL 9.7 (L)  HCT Latest Ref Range: 39.0 - 52.0 % 29.7 (L)  MCV Latest Ref Range: 78.0 - 100.0 fL 106.1 (H)  MCH Latest Ref Range: 26.0 - 34.0 pg 34.6 (H)  MCHC Latest Ref Range: 30.0 - 36.0 g/dL 32.7  RDW Latest Ref Range: 11.5 - 15.5 % 14.0  Platelets Latest Ref Range: 150 - 400 K/uL 165   Assessment & Plan:  SHAQUILL ISEMAN is a 72 y.o. male with multiple myeloma in maintenance therapy who needs a port a catheter for infusions on a twice monthly basis. He is ready now to get a port placed after using PIV for his other treatment and having difficulty with access during his last therapy.  -Platelets were 165 on last labs   All questions were answered to the satisfaction of the patient and family.  The risk and benefits of port a catheter placement were discussed including but not limited to bleeding, infection, pneumothorax, malfunction, need for removal.  After careful consideration, SHAIL URBAS has decided to proceed. Will plan for left side given prior CVL on right, right clavicle deformity and right handed.     Virl Cagey 06/09/2018, 10:59 AM

## 2018-06-09 NOTE — Progress Notes (Signed)
[facility] History and Physical  Reason for Referral: Port a catheter placement  Referring Physician: Dr. Katragadda    Altair F Vanorman is a 72 y.o. male.  HPI: Mr. Gappa is a 72 yo with Multiple myeloma on maintenance therapy twice per month per his report and prior bone marrow transplant at [facility] 09/2017.  He says he is doing fair and has been getting his therapy with a PIV but his veins are no longer tolerating this method. He is here for a port a catheter placement. In the past, he has had a tunneled CVL on the right it sounds like, but no implanted port.  He is right handed and has a right clavicular fracture from the distant past. He says he is otherwise well and has no complaints. He did have breast cancer on the left breast and adamantly denies any lumpectomy, but there is a scar consistent with this at the left breast.  He reports a needle biopsy.  He had no axillary surgery on the left.       Past Medical History:  Diagnosis Date  . Anxiety   . Bone metastases ([name] 09/10/2016  . Breast cancer ([name] 2011   Stave IV breast cancer; radiation and tamoxifen  . Breast cancer, male ([name]    Stave IV breast cancer; radiation and tamoxifen  Overview:  Left breast ca with mets bone  Overview:  METS TO BONE  . GERD (gastroesophageal reflux disease)   . Hypertension   . Macular degeneration   . Multiple myeloma ([name] 02/18/2017         Past Surgical History:  Procedure Laterality Date  . BACK SURGERY    . HERNIA REPAIR    . right arm surgery           Family History  Problem Relation Age of Onset  . Stroke Mother   . Cancer Maternal Aunt        cancer NOS; died in her 50s  . Lung cancer Maternal Uncle        smoker    Social History        Tobacco Use  . Smoking status: Never Smoker  . Smokeless tobacco: Former User  Substance Use Topics  . Alcohol use: Yes    Alcohol/week: 14.4 oz    Types: 24 Cans of beer per  week  . Drug use: No    Medications: I have reviewed the patient's current medications. Allergies as of 06/09/2018   No Known Allergies              Medication List            Accurate as of 06/09/18 10:59 AM. Always use your most recent med list.           acyclovir 400 MG tablet Commonly known as:  ZOVIRAX Take 400 mg by mouth 2 (two) times daily.   albuterol 108 (90 Base) MCG/ACT inhaler Commonly known as:  PROVENTIL HFA;VENTOLIN HFA Inhale 2 puffs into the lungs every 6 (six) hours as needed for wheezing or shortness of breath.   ALPRAZolam 0.5 MG tablet Commonly known as:  XANAX TAKE 1 TABLET BY MOUTH AT BEDTIME AS NEEDED FOR ANXIETY   aspirin EC 81 MG tablet Take 162 mg by mouth daily.   calcium-vitamin D 500-200 MG-UNIT Tabs tablet Commonly known as:  OSCAL WITH D TAKE TWO TABLETS BY MOUTH DAILY   cyclophosphamide 2 g chemo injection Commonly known as:  CYTOXAN   Inject into the vein once. Day 1, day 8, day 15, every 28 days for 2 months   folic acid 1 MG tablet Commonly known as:  FOLVITE Take 1 mg by mouth daily.   gabapentin 300 MG capsule Commonly known as:  NEURONTIN Take two times a day, then increase to three times a day in a few weeks.   guaiFENesin 600 MG 12 hr tablet Commonly known as:  MUCINEX Take 600 mg by mouth 2 (two) times daily as needed for cough.   KYPROLIS IV Inject into the vein. Day 1 and 2, day 8 and 9, day 15 and 16 every 28 days for 2 months   loratadine 10 MG tablet Commonly known as:  CLARITIN Take 1 tablet (10 mg total) by mouth daily.   metoprolol succinate 50 MG 24 hr tablet Commonly known as:  TOPROL-XL Take 50 mg by mouth daily.   multivitamin with minerals Tabs tablet Take 1 tablet by mouth daily.   ondansetron 8 MG tablet Commonly known as:  ZOFRAN Take 1 tablet (8 mg total) by mouth every 8 (eight) hours as needed for nausea or vomiting.   pantoprazole 40 MG tablet Commonly known as:   PROTONIX Take 40 mg by mouth every evening.   prochlorperazine 10 MG tablet Commonly known as:  COMPAZINE Take 1 tablet (10 mg total) by mouth every 6 (six) hours as needed for nausea or vomiting.   tamoxifen 20 MG tablet Commonly known as:  NOLVADEX TAKE 1 TABLET EVERY DAY   XGEVA 120 MG/1.7ML Soln injection Generic drug:  denosumab Inject 120 mg into the skin once.        ROS:  A comprehensive review of systems was negative except for: Eyes: positive for blurry/ double vision Gastrointestinal: positive for reflux symptoms Musculoskeletal: positive for back pain, neck pain and stiff joints Neurological: positive for feet numbness Endocrine: positive for hot/ cold intolerance, and tired Allergic/Immunologic: positive for hay fever  Blood pressure (!) 155/88, pulse 75, temperature 98.9 F (37.2 C), temperature source Temporal, resp. rate 18, weight 201 lb (91.2 kg). Physical Exam  Constitutional: He is oriented to person, place, and time. He appears well-developed and well-nourished.  HENT:  Head: Normocephalic.  Eyes: Pupils are equal, round, and reactive to light. EOM are normal.  Neck: Normal range of motion. Neck supple.  Cardiovascular: Normal rate and regular rhythm.  Pulmonary/Chest: Effort normal and breath sounds normal.  No obvious scars on right chest, left breast scar above nipple, right clavicle with some deformity  Abdominal: Soft. He exhibits no distension. There is no tenderness.  Musculoskeletal: Normal range of motion. He exhibits no edema.  Neurological: He is alert and oriented to person, place, and time.  Skin: Skin is warm and dry.  Psychiatric: He has a normal mood and affect. His behavior is normal. Judgment and thought content normal.  Vitals reviewed.   Results: Results for Scaglione, Eddy F (MRN [id number]) as of 06/09/2018 16:34  Ref. Range 06/02/2018 10:35  WBC Latest Ref Range: 4.0 - 10.5 K/uL 5.0  RBC Latest Ref Range: 4.22 - 5.81  MIL/uL 2.80 (L)  Hemoglobin Latest Ref Range: 13.0 - 17.0 g/dL 9.7 (L)  HCT Latest Ref Range: 39.0 - 52.0 % 29.7 (L)  MCV Latest Ref Range: 78.0 - 100.0 fL 106.1 (H)  MCH Latest Ref Range: 26.0 - 34.0 pg 34.6 (H)  MCHC Latest Ref Range: 30.0 - 36.0 g/dL 32.7  RDW Latest Ref Range: 11.5 - 15.5 % 14.0    Platelets Latest Ref Range: 150 - 400 K/uL 165   Assessment & Plan:  Johnny Lucas is a 72 y.o. male with multiple myeloma in maintenance therapy who needs a port a catheter for infusions on a twice monthly basis. He is ready now to get a port placed after using PIV for his other treatment and having difficulty with access during his last therapy.  -Platelets were 165 on last labs   All questions were answered to the satisfaction of the patient and family.  The risk and benefits of port a catheter placement were discussed including but not limited to bleeding, infection, pneumothorax, malfunction, need for removal.  After careful consideration, Rhone F Gaus has decided to proceed. Will plan for left side given prior CVL on right, right clavicle deformity and right handed.     Analaura Messler C Chiyo Fay 06/09/2018, 10:59 AM    

## 2018-06-09 NOTE — Patient Instructions (Signed)
Implanted Port Home Guide An implanted port is a type of central line that is placed under the skin. Central lines are used to provide IV access when treatment or nutrition needs to be given through a person's veins. Implanted ports are used for long-term IV access. An implanted port may be placed because:  You need IV medicine that would be irritating to the small veins in your hands or arms.  You need long-term IV medicines, such as antibiotics.  You need IV nutrition for a long period.  You need frequent blood draws for lab tests.  You need dialysis.  Implanted ports are usually placed in the chest area, but they can also be placed in the upper arm, the abdomen, or the leg. An implanted port has two main parts:  Reservoir. The reservoir is round and will appear as a small, raised area under your skin. The reservoir is the part where a needle is inserted to give medicines or draw blood.  Catheter. The catheter is a thin, flexible tube that extends from the reservoir. The catheter is placed into a large vein. Medicine that is inserted into the reservoir goes into the catheter and then into the vein.  How will I care for my incision site? Do not get the incision site wet. Bathe or shower as directed by your health care provider. How is my port accessed? Special steps must be taken to access the port:  Before the port is accessed, a numbing cream can be placed on the skin. This helps numb the skin over the port site.  Your health care provider uses a sterile technique to access the port. ? Your health care provider must put on a mask and sterile gloves. ? The skin over your port is cleaned carefully with an antiseptic and allowed to dry. ? The port is gently pinched between sterile gloves, and a needle is inserted into the port.  Only "non-coring" port needles should be used to access the port. Once the port is accessed, a blood return should be checked. This helps ensure that the port  is in the vein and is not clogged.  If your port needs to remain accessed for a constant infusion, a clear (transparent) bandage will be placed over the needle site. The bandage and needle will need to be changed every week, or as directed by your health care provider.  Keep the bandage covering the needle clean and dry. Do not get it wet. Follow your health care provider's instructions on how to take a shower or bath while the port is accessed.  If your port does not need to stay accessed, no bandage is needed over the port.  What is flushing? Flushing helps keep the port from getting clogged. Follow your health care provider's instructions on how and when to flush the port. Ports are usually flushed with saline solution or a medicine called heparin. The need for flushing will depend on how the port is used.  If the port is used for intermittent medicines or blood draws, the port will need to be flushed: ? After medicines have been given. ? After blood has been drawn. ? As part of routine maintenance.  If a constant infusion is running, the port may not need to be flushed.  How long will my port stay implanted? The port can stay in for as long as your health care provider thinks it is needed. When it is time for the port to come out, surgery will be   done to remove it. The procedure is similar to the one performed when the port was put in. When should I seek immediate medical care? When you have an implanted port, you should seek immediate medical care if:  You notice a bad smell coming from the incision site.  You have swelling, redness, or drainage at the incision site.  You have more swelling or pain at the port site or the surrounding area.  You have a fever that is not controlled with medicine.  This information is not intended to replace advice given to you by your health care provider. Make sure you discuss any questions you have with your health care provider. Document  Released: 12/14/2005 Document Revised: 05/21/2016 Document Reviewed: 08/21/2013 Elsevier Interactive Patient Education  2017 [employer]vier Inc[employer]  

## 2018-06-10 ENCOUNTER — Ambulatory Visit (HOSPITAL_COMMUNITY)
Admission: RE | Admit: 2018-06-10 | Discharge: 2018-06-10 | Disposition: A | Discharge status: Home / Self Care | Payer: Medicare Other | Source: Ambulatory Visit | Attending: General Surgery | Admitting: General Surgery

## 2018-06-10 ENCOUNTER — Encounter (HOSPITAL_COMMUNITY): Payer: Self-pay | Admitting: *Deleted

## 2018-06-10 ENCOUNTER — Ambulatory Visit (HOSPITAL_COMMUNITY): Payer: Medicare Other

## 2018-06-10 ENCOUNTER — Ambulatory Visit (HOSPITAL_COMMUNITY): Payer: Medicare Other | Admitting: Anesthesiology

## 2018-06-10 ENCOUNTER — Other Ambulatory Visit: Payer: Self-pay

## 2018-06-10 ENCOUNTER — Encounter (HOSPITAL_COMMUNITY)
Admission: RE | Disposition: A | Discharge status: Home / Self Care | Payer: Self-pay | Source: Ambulatory Visit | Attending: General Surgery

## 2018-06-10 DIAGNOSIS — F419 Anxiety disorder, unspecified: Secondary | ICD-10-CM | POA: Diagnosis not present

## 2018-06-10 DIAGNOSIS — Z853 Personal history of malignant neoplasm of breast: Secondary | ICD-10-CM | POA: Insufficient documentation

## 2018-06-10 DIAGNOSIS — Z79899 Other long term (current) drug therapy: Secondary | ICD-10-CM | POA: Insufficient documentation

## 2018-06-10 DIAGNOSIS — K219 Gastro-esophageal reflux disease without esophagitis: Secondary | ICD-10-CM | POA: Diagnosis not present

## 2018-06-10 DIAGNOSIS — Z95828 Presence of other vascular implants and grafts: Secondary | ICD-10-CM

## 2018-06-10 DIAGNOSIS — I10 Essential (primary) hypertension: Secondary | ICD-10-CM | POA: Insufficient documentation

## 2018-06-10 DIAGNOSIS — C9000 Multiple myeloma not having achieved remission: Secondary | ICD-10-CM | POA: Diagnosis not present

## 2018-06-10 DIAGNOSIS — Z4682 Encounter for fitting and adjustment of non-vascular catheter: Secondary | ICD-10-CM | POA: Diagnosis not present

## 2018-06-10 HISTORY — PX: PORTACATH PLACEMENT: SHX2246

## 2018-06-10 SURGERY — INSERTION, TUNNELED CENTRAL VENOUS DEVICE, WITH PORT
Anesthesia: Monitor Anesthesia Care | Site: Chest | Laterality: Left

## 2018-06-10 MED ORDER — DOCUSATE SODIUM 100 MG PO CAPS: 100.0000 mg | ORAL_CAPSULE | Freq: Two times a day (BID) | ORAL | 2 refills | Status: DC | PRN

## 2018-06-10 MED ORDER — PROPOFOL 500 MG/50ML IV EMUL
INTRAVENOUS | Status: DC | PRN
  Administered 2018-06-10: 75 ug/kg/min via INTRAVENOUS
  Administered 2018-06-10: 11:00:00 via INTRAVENOUS

## 2018-06-10 MED ORDER — CHLORHEXIDINE GLUCONATE CLOTH 2 % EX PADS: 6.0000 | MEDICATED_PAD | Freq: Once | CUTANEOUS | Status: DC

## 2018-06-10 MED ORDER — SODIUM CHLORIDE 0.9 % IV SOLN
INTRAVENOUS | Status: AC | PRN
  Administered 2018-06-10: 500 mL via INTRAMUSCULAR

## 2018-06-10 MED ORDER — HYDROCODONE-ACETAMINOPHEN 5-325 MG PO TABS: 1.0000 | ORAL_TABLET | ORAL | 0 refills | Status: DC | PRN

## 2018-06-10 MED ORDER — LACTATED RINGERS IV SOLN: INTRAVENOUS | Status: DC

## 2018-06-10 MED ORDER — MEPERIDINE HCL 50 MG/ML IJ SOLN: 6.2500 mg | INTRAMUSCULAR | Status: DC | PRN

## 2018-06-10 MED ORDER — LIDOCAINE HCL (PF) 1 % IJ SOLN
INTRAMUSCULAR | Status: AC
Start: 2018-06-10 — End: ?
  Filled 2018-06-10: qty 30

## 2018-06-10 MED ORDER — MIDAZOLAM HCL 2 MG/2ML IJ SOLN
INTRAMUSCULAR | Status: AC
  Filled 2018-06-10: qty 2

## 2018-06-10 MED ORDER — LIDOCAINE HCL (PF) 1 % IJ SOLN
INTRAMUSCULAR | Status: DC | PRN
  Administered 2018-06-10: 14 mL

## 2018-06-10 MED ORDER — HYDROCODONE-ACETAMINOPHEN 7.5-325 MG PO TABS: 1.0000 | ORAL_TABLET | Freq: Once | ORAL | Status: DC | PRN

## 2018-06-10 MED ORDER — PROMETHAZINE HCL 25 MG/ML IJ SOLN: 6.2500 mg | INTRAMUSCULAR | Status: DC | PRN

## 2018-06-10 MED ORDER — HEPARIN SOD (PORK) LOCK FLUSH 100 UNIT/ML IV SOLN
INTRAVENOUS | Status: DC | PRN
  Administered 2018-06-10: 500 [IU] via INTRAVENOUS

## 2018-06-10 MED ORDER — PROPOFOL 10 MG/ML IV BOLUS
INTRAVENOUS | Status: DC | PRN
  Administered 2018-06-10 (×2): 10 mg via INTRAVENOUS
  Administered 2018-06-10 (×2): 20 mg via INTRAVENOUS

## 2018-06-10 MED ORDER — LIDOCAINE HCL (PF) 1 % IJ SOLN
INTRAMUSCULAR | Status: AC
  Filled 2018-06-10: qty 5

## 2018-06-10 MED ORDER — CEFAZOLIN SODIUM-DEXTROSE 2-4 GM/100ML-% IV SOLN
2.0000 g | INTRAVENOUS | Status: AC
  Administered 2018-06-10: 2 g via INTRAVENOUS
  Filled 2018-06-10: qty 100

## 2018-06-10 MED ORDER — PROPOFOL 10 MG/ML IV BOLUS
INTRAVENOUS | Status: AC
  Filled 2018-06-10: qty 40

## 2018-06-10 MED ORDER — MIDAZOLAM HCL 5 MG/5ML IJ SOLN
INTRAMUSCULAR | Status: DC | PRN
  Administered 2018-06-10 (×2): 1 mg via INTRAVENOUS

## 2018-06-10 MED ORDER — LIDOCAINE HCL (PF) 1 % IJ SOLN
INTRAMUSCULAR | Status: AC
  Filled 2018-06-10: qty 30

## 2018-06-10 MED ORDER — HYDROMORPHONE HCL 1 MG/ML IJ SOLN: 0.2500 mg | INTRAMUSCULAR | Status: DC | PRN

## 2018-06-10 MED ORDER — LACTATED RINGERS IV SOLN
INTRAVENOUS | Status: DC
  Administered 2018-06-10: 09:00:00 via INTRAVENOUS

## 2018-06-10 MED ORDER — HEPARIN SOD (PORK) LOCK FLUSH 100 UNIT/ML IV SOLN
INTRAVENOUS | Status: AC
  Filled 2018-06-10: qty 5

## 2018-06-10 SURGICAL SUPPLY — 36 items
APPLIER CLIP 9.375 SM OPEN (CLIP)
BAG DECANTER FOR FLEXI CONT (MISCELLANEOUS) ×3 IMPLANT
CHLORAPREP W/TINT 10.5 ML (MISCELLANEOUS) ×3 IMPLANT
CLIP APPLIE 9.375 SM OPEN (CLIP) IMPLANT
CLOTH BEACON ORANGE TIMEOUT ST (SAFETY) ×3 IMPLANT
COVER LIGHT HANDLE STERIS (MISCELLANEOUS) ×6 IMPLANT
COVER PROBE U/S 5X48 (MISCELLANEOUS) ×6 IMPLANT
DECANTER SPIKE VIAL GLASS SM (MISCELLANEOUS) ×3 IMPLANT
DERMABOND ADVANCED (GAUZE/BANDAGES/DRESSINGS) ×2
DERMABOND ADVANCED .7 DNX12 (GAUZE/BANDAGES/DRESSINGS) ×1 IMPLANT
DRAPE C-ARM FOLDED MOBILE STRL (DRAPES) ×3 IMPLANT
ELECT REM PT RETURN 9FT ADLT (ELECTROSURGICAL) ×3
ELECTRODE REM PT RTRN 9FT ADLT (ELECTROSURGICAL) ×1 IMPLANT
GEL ULTRASOUND 20GR AQUASONIC (MISCELLANEOUS) ×6 IMPLANT
GLOVE BIO SURGEON STRL SZ 6.5 (GLOVE) ×2 IMPLANT
GLOVE BIO SURGEONS STRL SZ 6.5 (GLOVE) ×1
GLOVE BIOGEL PI IND STRL 6.5 (GLOVE) ×1 IMPLANT
GLOVE BIOGEL PI IND STRL 7.0 (GLOVE) ×1 IMPLANT
GLOVE BIOGEL PI INDICATOR 6.5 (GLOVE) ×2
GLOVE BIOGEL PI INDICATOR 7.0 (GLOVE) ×2
GOWN STRL REUS W/TWL LRG LVL3 (GOWN DISPOSABLE) ×6 IMPLANT
IV NS 500ML (IV SOLUTION) ×2
IV NS 500ML BAXH (IV SOLUTION) ×1 IMPLANT
KIT PORT POWER 8FR ISP MRI (Port) ×3 IMPLANT
KIT TURNOVER KIT A (KITS) ×3 IMPLANT
MANIFOLD NEPTUNE II (INSTRUMENTS) ×3 IMPLANT
NEEDLE HYPO 25X1 1.5 SAFETY (NEEDLE) ×3 IMPLANT
PACK MINOR (CUSTOM PROCEDURE TRAY) ×3 IMPLANT
PAD ARMBOARD 7.5X6 YLW CONV (MISCELLANEOUS) ×3 IMPLANT
SET BASIN LINEN APH (SET/KITS/TRAYS/PACK) ×3 IMPLANT
SUT MNCRL AB 4-0 PS2 18 (SUTURE) ×3 IMPLANT
SUT PROLENE 2 0 SH 30 (SUTURE) ×3 IMPLANT
SUT VIC AB 3-0 SH 27 (SUTURE) ×2
SUT VIC AB 3-0 SH 27X BRD (SUTURE) ×1 IMPLANT
SYR 20CC LL (SYRINGE) ×3 IMPLANT
SYR CONTROL 10ML LL (SYRINGE) ×3 IMPLANT

## 2018-06-10 NOTE — Op Note (Addendum)
Operative Note 06/10/18   Preoperative Diagnosis: Multiple Myeloma    Postoperative Diagnosis: Same   Procedure(s) Performed: Port-A-Cath placement, left internal jugular with ultrasound guidance   Surgeon: Lanell Matar. Constance Haw, MD   Assistants: No qualified resident was available   Anesthesia: Monitored anesthesia care   Anesthesiologist: Dr. Currie Paris, MD     Specimens: None   Estimated Blood Loss: Minimal   Fluoroscopy time: 5 minute 30 seconds   Blood Replacement: None    Complications: None    Operative Findings: Unable to get blood back from subclavian vein, internal jugular on left large and without thrombus, catheter placed; challenge getting catheter down the superior vena cava but ultimately achieved after manipulation with the guidewire   Indications: Johnny Lucas is a 72 yo with multiple myeloma who is getting maintenance therapy, and will need this for several months. Prior treatment was given through a PIV but his access is now poor.  We discussed the risk and benefits of port placement including bleeding, infection, risk of malfunction, pneumothorax, and he opted to proceed.   Procedure: The patient was brought into the operating room and monitored anesthesia care was induced.  One percent lidocaine was used for local anesthesia.   The left chest and neck was prepped and draped in the usual sterile fashion.  Preoperative antibiotics were given.  An incision was made below the left clavicle. A subcutaneous pocket was formed. The needle was attempted to be advanced into the left subclavian vein, but this was never successful after multiple attempts and after manipulating his arm and putting him in Trendelenburg.  After several attempts, an ultrasound was used to access the left jugular vein using the Seldinger technique without difficulty.  The wire could be seen within the vein on ultrasound and going into the vena cava/ right atrium on fluoroscopy.  Ectopia was not noted. A  skin incision was made at the guidewire, and an introducer and peel-away sheath were placed over the guidewire.  The catheter was then tunneled from the port pocket over the clavicle to the neck incision. The tunneling device did purse my glove. The catheter was cut with the tunneler. The tunneler was passed immediately off the field. Gloves were changed. The catheter was then inserted through the peel-away sheath and the peel-away sheath was removed.  A spot film was performed to confirm the position. The catheter had curled up across to the right subclavian. The wire was reinserted into the catheter and manipulated to get the catheter and wire back down into the superior vena cava.  Once this was done, the wire was pulled back under fluoroscopy to ensure that the catheter did not slip back into the right subclavian.  The catheter was then attached to the port and the port placed in subcutaneous pocket. Adequate positioning was confirmed again by fluoroscopy. Hemostasis was confirmed, and the port was secured with 2-0 prolene sutures.  Good backflow of blood was noted on aspiration of the port. The port was flushed with heparin flush. Subcutaneous layer was reapproximated using a 3-0 Vicryl interrupted suture. The skin was closed using a 4-0 Vicryl subcuticular suture. Dermabond was applied.  All tape and needle counts were correct at the end of the procedure. The patient was transferred to PACU in stable condition. A chest x-ray will be performed at that time.  Curlene Labrum, MD [facility] [address] [place], [place] [id number] [phone] (office)

## 2018-06-10 NOTE — Interval H&P Note (Signed)
History and Physical Interval Note:  06/10/2018 10:00 AM  Johnny Lucas  has presented today for surgery, with the diagnosis of multiple myloma  The various methods of treatment have been discussed with the patient and family. After consideration of risks, benefits and other options for treatment, the patient has consented to  Procedure(s): INSERTION PORT-A-CATH (N/A) as a surgical intervention .  The patient's history has been reviewed, patient examined, no change in status, stable for surgery.  I have reviewed the patient's chart and labs.  Questions were answered to the patient's satisfaction.    No additional questions. Going for left side port.  Virl Cagey

## 2018-06-10 NOTE — Transfer of Care (Signed)
Immediate Anesthesia Transfer of Care Note  Patient: Johnny Lucas  Procedure(s) Performed: INSERTION PORT-A-CATH (Left Chest)  Patient Location: PACU  Anesthesia Type:MAC  Level of Consciousness: awake and alert   Airway & Oxygen Therapy: Patient Spontanous Breathing  Post-op Assessment: Report given to RN  Post vital signs: Reviewed and stable  Last Vitals:  Vitals Value Taken Time  BP 150/76 06/10/2018 11:37 AM  Temp    Pulse 64 06/10/2018 11:39 AM  Resp 13 06/10/2018 11:39 AM  SpO2 99 % 06/10/2018 11:39 AM  Vitals shown include unvalidated device data.  Last Pain:  Vitals:   06/10/18 0847  TempSrc: Oral  PainSc:          Complications: No apparent anesthesia complications

## 2018-06-10 NOTE — Anesthesia Postprocedure Evaluation (Signed)
Anesthesia Post Note  Patient: Johnny Lucas  Procedure(s) Performed: INSERTION PORT-A-CATH (Left Chest)  Patient location during evaluation: Short Stay Anesthesia Type: MAC Level of consciousness: awake and alert and oriented Pain management: pain level controlled Vital Signs Assessment: post-procedure vital signs reviewed and stable Respiratory status: spontaneous breathing Cardiovascular status: blood pressure returned to baseline and stable Postop Assessment: no apparent nausea or vomiting Anesthetic complications: no Comments: Late entry     Last Vitals:  Vitals:   06/10/18 1200 06/10/18 1215  BP: (!) 148/82 (!) 158/81  Pulse: 67 (!) 59  Resp: 19   Temp:    SpO2: 100% 96%    Last Pain:  Vitals:   06/10/18 1215  TempSrc:   PainSc: 2                  Azayla Polo

## 2018-06-10 NOTE — Discharge Instructions (Signed)
Keep area clean and dry. You can take a shower in 24 hours. Do not submerge in water.  Take tylenol and ibuprofen for pain control and Norco for severe pain.  Implanted Port Insertion, Care After This sheet gives you information about how to care for yourself after your procedure. Your health care provider may also give you more specific instructions. If you have problems or questions, contact your health care provider. What can I expect after the procedure? After your procedure, it is common to have:  Discomfort at the port insertion site.  Bruising on the skin over the port. This should improve over 3-4 days.  Follow these instructions at home: [facility] care  After your port is placed, you will get a manufacturer's information card. The card has information about your port. Keep this card with you at all times.  Take care of the port as told by your health care provider. Ask your health care provider if you or a family member can get training for taking care of the port at home. A home health care nurse may also take care of the port.  Make sure to remember what type of port you have. Incision care  Follow instructions from your health care provider about how to take care of your port insertion site. Make sure you: ? Wash your hands with soap and water before you change your bandage (dressing). If soap and water are not available, use hand sanitizer. ? Change your dressing as told by your health care provider. ? Leave stitches (sutures), skin glue, or adhesive strips in place. These skin closures may need to stay in place for 2 weeks or longer. If adhesive strip edges start to loosen and curl up, you may trim the loose edges. Do not remove adhesive strips completely unless your health care provider tells you to do that.  Check your port insertion site every day for signs of infection. Check for: ? More redness, swelling, or pain. ? More fluid or blood. ? Warmth. ? Pus or a bad  smell. General instructions  Do not take baths, swim, or use a hot tub until your health care provider approves.  Do not lift anything that is heavier than 10 lb (4.5 kg) for a week, or as told by your health care provider.  Ask your health care provider when it is okay to: ? Return to work or school. ? Resume usual physical activities or sports.  Do not drive for 24 hours if you were given a medicine to help you relax (sedative).  Take over-the-counter and prescription medicines only as told by your health care provider.  Wear a medical alert bracelet in case of an emergency. This will tell any health care providers that you have a port.  Keep all follow-up visits as told by your health care provider. This is important. Contact a health care provider if:  You cannot flush your port with saline as directed, or you cannot draw blood from the port.  You have a fever or chills.  You have more redness, swelling, or pain around your port insertion site.  You have more fluid or blood coming from your port insertion site.  Your port insertion site feels warm to the touch.  You have pus or a bad smell coming from the port insertion site. Get help right away if:  You have chest pain or shortness of breath.  You have bleeding from your port that you cannot control. Summary  Take care of  the port as told by your health care provider.  Change your dressing as told by your health care provider.  Keep all follow-up visits as told by your health care provider. This information is not intended to replace advice given to you by your health care provider. Make sure you discuss any questions you have with your health care provider. Document Released: 10/04/2013 Document Revised: 11/04/2016 Document Reviewed: 11/04/2016 Elsevier Interactive Patient Education  2017 [employer].

## 2018-06-10 NOTE — Anesthesia Preprocedure Evaluation (Signed)
Anesthesia Evaluation  Patient identified by MRN, date of birth, ID band Patient awake    Reviewed: Allergy & Precautions, H&P , NPO status , Patient's Chart, lab work & pertinent test results, reviewed documented beta blocker date and time   Airway Mallampati: II  TM Distance: >3 FB Neck ROM: full    Dental no notable dental hx. (+) Teeth Intact, Dental Advidsory Given   Pulmonary neg pulmonary ROS,    Pulmonary exam normal breath sounds clear to auscultation       Cardiovascular Exercise Tolerance: Good hypertension, On Medications negative cardio ROS   Rhythm:regular Rate:Normal     Neuro/Psych negative neurological ROS  negative psych ROS   GI/Hepatic negative GI ROS, Neg liver ROS, GERD  ,  Endo/Other  negative endocrine ROS  Renal/GU negative Renal ROS  negative genitourinary   Musculoskeletal   Abdominal   Peds  Hematology negative hematology ROS (+)   Anesthesia Other Findings Multiple Myeloma on Chemo  H/o metastatic breast ds to bone Note: pt requests "sedation" for procedure to avoid GA  Reproductive/Obstetrics negative OB ROS                             Anesthesia Physical Anesthesia Plan  ASA: IV  Anesthesia Plan: MAC   Post-op Pain Management:    Induction:   PONV Risk Score and Plan:   Airway Management Planned:   Additional Equipment:   Intra-op Plan:   Post-operative Plan:   Informed Consent: I have reviewed the patients History and Physical, chart, labs and discussed the procedure including the risks, benefits and alternatives for the proposed anesthesia with the patient or authorized representative who has indicated his/her understanding and acceptance.   Dental Advisory Given  Plan Discussed with: CRNA and Anesthesiologist  Anesthesia Plan Comments:         Anesthesia Quick Evaluation  

## 2018-06-13 ENCOUNTER — Inpatient Hospital Stay (HOSPITAL_COMMUNITY): Payer: Medicare Other

## 2018-06-13 ENCOUNTER — Encounter (HOSPITAL_COMMUNITY): Payer: Self-pay

## 2018-06-13 ENCOUNTER — Inpatient Hospital Stay (HOSPITAL_COMMUNITY): Payer: Medicare Other | Attending: Hematology

## 2018-06-13 DIAGNOSIS — Z5111 Encounter for antineoplastic chemotherapy: Secondary | ICD-10-CM | POA: Diagnosis not present

## 2018-06-13 DIAGNOSIS — C9000 Multiple myeloma not having achieved remission: Secondary | ICD-10-CM

## 2018-06-13 DIAGNOSIS — Z79899 Other long term (current) drug therapy: Secondary | ICD-10-CM | POA: Diagnosis not present

## 2018-06-13 LAB — COMPREHENSIVE METABOLIC PANEL
ALBUMIN: 3.8 g/dL (ref 3.5–5.0)
ALK PHOS: 28 U/L — AB (ref 38–126)
ALT: 15 U/L — AB (ref 17–63)
AST: 20 U/L (ref 15–41)
Anion gap: 5 (ref 5–15)
BUN: 17 mg/dL (ref 6–20)
CALCIUM: 8.6 mg/dL — AB (ref 8.9–10.3)
CO2: 24 mmol/L (ref 22–32)
CREATININE: 1.4 mg/dL — AB (ref 0.61–1.24)
Chloride: 110 mmol/L (ref 101–111)
GFR calc non Af Amer: 49 mL/min — ABNORMAL LOW (ref >60)
GFR, EST AFRICAN AMERICAN: 57 mL/min — AB (ref >60)
GLUCOSE: 110 mg/dL — AB (ref 65–99)
Potassium: 4.2 mmol/L (ref 3.5–5.1)
SODIUM: 139 mmol/L (ref 135–145)
Total Bilirubin: 0.6 mg/dL (ref 0.3–1.2)
Total Protein: 5.9 g/dL — ABNORMAL LOW (ref 6.5–8.1)

## 2018-06-13 LAB — CBC WITH DIFFERENTIAL/PLATELET
Basophils Absolute: 0 10*3/uL (ref 0.0–0.1)
Basophils Relative: 1 %
EOS ABS: 0.1 10*3/uL (ref 0.0–0.7)
Eosinophils Relative: 4 %
HCT: 31.6 % — ABNORMAL LOW (ref 39.0–52.0)
HEMOGLOBIN: 10.2 g/dL — AB (ref 13.0–17.0)
LYMPHS ABS: 0.5 10*3/uL — AB (ref 0.7–4.0)
Lymphocytes Relative: 15 %
MCH: 34.1 pg — AB (ref 26.0–34.0)
MCHC: 32.3 g/dL (ref 30.0–36.0)
MCV: 105.7 fL — ABNORMAL HIGH (ref 78.0–100.0)
Monocytes Absolute: 0.3 10*3/uL (ref 0.1–1.0)
Monocytes Relative: 10 %
NEUTROS ABS: 2.3 10*3/uL (ref 1.7–7.7)
NEUTROS PCT: 70 %
Platelets: 125 10*3/uL — ABNORMAL LOW (ref 150–400)
RBC: 2.99 MIL/uL — AB (ref 4.22–5.81)
RDW: 13.1 % (ref 11.5–15.5)
WBC: 3.2 10*3/uL — AB (ref 4.0–10.5)

## 2018-06-13 LAB — LACTATE DEHYDROGENASE: LDH: 162 U/L (ref 98–192)

## 2018-06-13 NOTE — Progress Notes (Signed)
Patient for xgeva shot today.  Labs reviewed with Dr. Delton Coombes with symptoms of Bronchitis with sinus congestion since last Wednesday, Fever last night up to 100 degrees, and clear drainage with cough that started this morning and clear drainage from nose.  Patient is taking plain mucinex.  Lungs auscultated bilaterally and clear.  Patients xgeva to be held today and possibly given on Thursday with chemotherapy.  Patient stated he is taking his calcium twice a day and denied tooth, jaw, or leg pain.  No s/s of distress noted.  Patient is going to call his PCP for medication and return on Thursday.

## 2018-06-14 DIAGNOSIS — Z6828 Body mass index (BMI) 28.0-28.9, adult: Secondary | ICD-10-CM | POA: Diagnosis not present

## 2018-06-14 DIAGNOSIS — J189 Pneumonia, unspecified organism: Secondary | ICD-10-CM | POA: Diagnosis not present

## 2018-06-14 LAB — BETA 2 MICROGLOBULIN, SERUM: Beta-2 Microglobulin: 3.4 mg/L — ABNORMAL HIGH (ref 0.6–2.4)

## 2018-06-14 LAB — IGG, IGA, IGM
IgA: 6 mg/dL — ABNORMAL LOW (ref 61–437)
IgG (Immunoglobin G), Serum: 332 mg/dL — ABNORMAL LOW (ref 700–1600)
IgM (Immunoglobulin M), Srm: 17 mg/dL — ABNORMAL LOW (ref 20–172)

## 2018-06-14 LAB — PROTEIN ELECTROPHORESIS, SERUM
A/G Ratio: 2 — ABNORMAL HIGH (ref 0.7–1.7)
ALBUMIN ELP: 3.8 g/dL (ref 2.9–4.4)
Alpha-1-Globulin: 0.3 g/dL (ref 0.0–0.4)
Alpha-2-Globulin: 0.6 g/dL (ref 0.4–1.0)
BETA GLOBULIN: 0.8 g/dL (ref 0.7–1.3)
GAMMA GLOBULIN: 0.3 g/dL — AB (ref 0.4–1.8)
Globulin, Total: 1.9 g/dL — ABNORMAL LOW (ref 2.2–3.9)
M-SPIKE, %: 0.1 g/dL — AB
TOTAL PROTEIN ELP: 5.7 g/dL — AB (ref 6.0–8.5)

## 2018-06-14 LAB — IMMUNOFIXATION ELECTROPHORESIS
IGA: 6 mg/dL — AB (ref 61–437)
IGG (IMMUNOGLOBIN G), SERUM: 326 mg/dL — AB (ref 700–1600)
IGM (IMMUNOGLOBULIN M), SRM: 16 mg/dL — AB (ref 20–172)
TOTAL PROTEIN ELP: 5.6 g/dL — AB (ref 6.0–8.5)

## 2018-06-14 LAB — KAPPA/LAMBDA LIGHT CHAINS
Kappa free light chain: 19.2 mg/L (ref 3.3–19.4)
Kappa, lambda light chain ratio: 2.74 — ABNORMAL HIGH (ref 0.26–1.65)
LAMDA FREE LIGHT CHAINS: 7 mg/L (ref 5.7–26.3)

## 2018-06-15 ENCOUNTER — Telehealth (HOSPITAL_COMMUNITY): Payer: Self-pay

## 2018-06-15 NOTE — Telephone Encounter (Signed)
Called wife back today to check on patient. Patient had been placed on zpack with rocephin and depomedrol injection yesterday after seeing PCP. Also taking mucinex. Patient has been afebrile today, stated he is feeling better. Dr. Delton Coombes notified and will reschedule patient for next week , patient notified. Amy to call tomorrow to reschedule.

## 2018-06-16 ENCOUNTER — Ambulatory Visit (HOSPITAL_COMMUNITY): Payer: Medicare Other

## 2018-06-16 ENCOUNTER — Other Ambulatory Visit (HOSPITAL_COMMUNITY): Payer: Medicare Other

## 2018-06-20 DIAGNOSIS — S20362A Insect bite (nonvenomous) of left front wall of thorax, initial encounter: Secondary | ICD-10-CM | POA: Diagnosis not present

## 2018-06-20 DIAGNOSIS — Z6827 Body mass index (BMI) 27.0-27.9, adult: Secondary | ICD-10-CM | POA: Diagnosis not present

## 2018-06-20 DIAGNOSIS — J189 Pneumonia, unspecified organism: Secondary | ICD-10-CM | POA: Diagnosis not present

## 2018-06-23 ENCOUNTER — Inpatient Hospital Stay (HOSPITAL_COMMUNITY): Payer: Medicare Other

## 2018-06-23 ENCOUNTER — Other Ambulatory Visit: Payer: Self-pay

## 2018-06-23 ENCOUNTER — Encounter (HOSPITAL_COMMUNITY): Payer: Self-pay

## 2018-06-23 VITALS — BP 131/67 | HR 57 | Temp 97.7°F | Resp 18 | Wt 195.4 lb

## 2018-06-23 DIAGNOSIS — C9000 Multiple myeloma not having achieved remission: Secondary | ICD-10-CM

## 2018-06-23 DIAGNOSIS — Z79899 Other long term (current) drug therapy: Secondary | ICD-10-CM | POA: Diagnosis not present

## 2018-06-23 DIAGNOSIS — Z5111 Encounter for antineoplastic chemotherapy: Secondary | ICD-10-CM | POA: Diagnosis not present

## 2018-06-23 LAB — CBC WITH DIFFERENTIAL/PLATELET
Basophils Absolute: 0.1 10*3/uL (ref 0.0–0.1)
Basophils Relative: 1 %
EOS PCT: 5 %
Eosinophils Absolute: 0.4 10*3/uL (ref 0.0–0.7)
HCT: 34.1 % — ABNORMAL LOW (ref 39.0–52.0)
Hemoglobin: 11.1 g/dL — ABNORMAL LOW (ref 13.0–17.0)
LYMPHS ABS: 1.2 10*3/uL (ref 0.7–4.0)
LYMPHS PCT: 15 %
MCH: 33.5 pg (ref 26.0–34.0)
MCHC: 32.6 g/dL (ref 30.0–36.0)
MCV: 103 fL — AB (ref 78.0–100.0)
MONO ABS: 0.6 10*3/uL (ref 0.1–1.0)
Monocytes Relative: 7 %
Neutro Abs: 5.9 10*3/uL (ref 1.7–7.7)
Neutrophils Relative %: 72 %
PLATELETS: 194 10*3/uL (ref 150–400)
RBC: 3.31 MIL/uL — AB (ref 4.22–5.81)
RDW: 13.2 % (ref 11.5–15.5)
WBC: 8.1 10*3/uL (ref 4.0–10.5)

## 2018-06-23 LAB — COMPREHENSIVE METABOLIC PANEL
ALT: 37 U/L (ref 0–44)
AST: 24 U/L (ref 15–41)
Albumin: 4 g/dL (ref 3.5–5.0)
Alkaline Phosphatase: 33 U/L — ABNORMAL LOW (ref 38–126)
Anion gap: 9 (ref 5–15)
BILIRUBIN TOTAL: 0.5 mg/dL (ref 0.3–1.2)
BUN: 16 mg/dL (ref 8–23)
CHLORIDE: 105 mmol/L (ref 98–111)
CO2: 26 mmol/L (ref 22–32)
CREATININE: 1.61 mg/dL — AB (ref 0.61–1.24)
Calcium: 9.4 mg/dL (ref 8.9–10.3)
GFR calc Af Amer: 48 mL/min — ABNORMAL LOW (ref >60)
GFR, EST NON AFRICAN AMERICAN: 42 mL/min — AB (ref >60)
Glucose, Bld: 119 mg/dL — ABNORMAL HIGH (ref 70–99)
Potassium: 4.1 mmol/L (ref 3.5–5.1)
Sodium: 140 mmol/L (ref 135–145)
TOTAL PROTEIN: 6.5 g/dL (ref 6.5–8.1)

## 2018-06-23 MED ORDER — DEXTROSE 5 % IV SOLN
34.0000 mg/m2 | Freq: Once | INTRAVENOUS | Status: AC
  Administered 2018-06-23: 70 mg via INTRAVENOUS
  Filled 2018-06-23: qty 30

## 2018-06-23 MED ORDER — HEPARIN SOD (PORK) LOCK FLUSH 100 UNIT/ML IV SOLN
500.0000 [IU] | Freq: Once | INTRAVENOUS | Status: AC | PRN
  Administered 2018-06-23: 500 [IU]
  Filled 2018-06-23: qty 5

## 2018-06-23 MED ORDER — SODIUM CHLORIDE 0.9 % IV SOLN
Freq: Once | INTRAVENOUS | Status: AC
  Administered 2018-06-23: 11:00:00 via INTRAVENOUS

## 2018-06-23 MED ORDER — SODIUM CHLORIDE 0.9% FLUSH
10.0000 mL | INTRAVENOUS | Status: DC | PRN
  Administered 2018-06-23: 10 mL
  Filled 2018-06-23: qty 10

## 2018-06-23 MED ORDER — ACETAMINOPHEN 325 MG PO TABS
650.0000 mg | ORAL_TABLET | Freq: Once | ORAL | Status: AC
  Administered 2018-06-23: 650 mg via ORAL
  Filled 2018-06-23: qty 2

## 2018-06-23 MED ORDER — PROCHLORPERAZINE MALEATE 10 MG PO TABS
10.0000 mg | ORAL_TABLET | Freq: Once | ORAL | Status: AC
  Administered 2018-06-23: 10 mg via ORAL
  Filled 2018-06-23: qty 1

## 2018-06-23 NOTE — Patient Instructions (Signed)
[facility] Discharge Instructions for Patients Receiving Chemotherapy   Beginning January 23rd 2017 lab work for the Cancer Center will be done in the  Main lab at [place] on 1st floor. If you have a lab appointment with the Cancer Center please come in thru the  Main Entrance and check in at the main information desk   Today you received the following chemotherapy agents   To help prevent nausea and vomiting after your treatment, we encourage you to take your nausea medication     If you develop nausea and vomiting, or diarrhea that is not controlled by your medication, call the clinic.  The clinic phone number is [phone]. Office hours are Monday-Friday 8:30am-5:00pm.  BELOW ARE SYMPTOMS THAT SHOULD BE REPORTED IMMEDIATELY:  *FEVER GREATER THAN 101.0 F  *CHILLS WITH OR WITHOUT FEVER  NAUSEA AND VOMITING THAT IS NOT CONTROLLED WITH YOUR NAUSEA MEDICATION  *UNUSUAL SHORTNESS OF BREATH  *UNUSUAL BRUISING OR BLEEDING  TENDERNESS IN MOUTH AND THROAT WITH OR WITHOUT PRESENCE OF ULCERS  *URINARY PROBLEMS  *BOWEL PROBLEMS  UNUSUAL RASH Items with * indicate a potential emergency and should be followed up as soon as possible. If you have an emergency after office hours please contact your primary care physician or go to the nearest emergency department.  Please call the clinic during office hours if you have any questions or concerns.   You may also contact the Patient Navigator at [phone] should you have any questions or need assistance in obtaining follow up care.      Resources For Cancer Patients and their Caregivers ? American Cancer Society: Can assist with transportation, wigs, general needs, runs Look Good Feel Better.        1-888-227-6333 ? Cancer Care: Provides financial assistance, online support groups, medication/co-pay assistance.  1-800-813-HOPE (4673) ? [facility] Assists [place] cancer  patients and their families through emotional , educational and financial support.  336-427-4357 ? [place] DSS Where to apply for food stamps, Medicaid and utility assistance. 336-342-1394 ? [facility]: Transportation to medical appointments. 336-347-2287 ? Social Security Administration: May apply for disability if have a Stage IV cancer. 336-342-7796 1-800-772-1213 ? [place] Aging, Disability and Transit Services: Assists with nutrition, care and transit needs. 336-349-2343         

## 2018-06-23 NOTE — Progress Notes (Signed)
Labs reviewed by Dr. Delton Coombes, he is aware of his creatinine at 1.61. Proceed with treatment.  Per Dr. Delton Coombes, will give 500 ml of saline before and after treatment, over an hour.  Treatment given per orders. Patient tolerated it well without problems. Vitals stable and discharged home from clinic ambulatory. Follow up as scheduled.

## 2018-06-24 ENCOUNTER — Encounter (HOSPITAL_COMMUNITY): Payer: Self-pay

## 2018-06-24 ENCOUNTER — Other Ambulatory Visit: Payer: Self-pay

## 2018-06-24 ENCOUNTER — Inpatient Hospital Stay (HOSPITAL_COMMUNITY): Payer: Medicare Other

## 2018-06-24 VITALS — BP 141/78 | HR 76 | Temp 97.6°F | Resp 16

## 2018-06-24 DIAGNOSIS — C7951 Secondary malignant neoplasm of bone: Secondary | ICD-10-CM

## 2018-06-24 DIAGNOSIS — C9000 Multiple myeloma not having achieved remission: Secondary | ICD-10-CM

## 2018-06-24 DIAGNOSIS — Z5111 Encounter for antineoplastic chemotherapy: Secondary | ICD-10-CM | POA: Diagnosis not present

## 2018-06-24 DIAGNOSIS — Z79899 Other long term (current) drug therapy: Secondary | ICD-10-CM | POA: Diagnosis not present

## 2018-06-24 MED ORDER — DENOSUMAB 120 MG/1.7ML ~~LOC~~ SOLN
120.0000 mg | Freq: Once | SUBCUTANEOUS | Status: AC
  Administered 2018-06-24: 120 mg via SUBCUTANEOUS
  Filled 2018-06-24 (×2): qty 1.7

## 2018-06-24 NOTE — Progress Notes (Signed)
Pt here today for Xgeva injection. Pt given injection in right arm. Pt tolerated injection well with no complaints. Pt stable and discharged home ambulatory with wife.

## 2018-06-27 DIAGNOSIS — Z23 Encounter for immunization: Secondary | ICD-10-CM | POA: Diagnosis not present

## 2018-06-27 DIAGNOSIS — G629 Polyneuropathy, unspecified: Secondary | ICD-10-CM | POA: Diagnosis not present

## 2018-06-27 DIAGNOSIS — D801 Nonfamilial hypogammaglobulinemia: Secondary | ICD-10-CM | POA: Diagnosis not present

## 2018-06-27 DIAGNOSIS — Z9484 Stem cells transplant status: Secondary | ICD-10-CM | POA: Diagnosis not present

## 2018-06-27 DIAGNOSIS — C9000 Multiple myeloma not having achieved remission: Secondary | ICD-10-CM | POA: Diagnosis not present

## 2018-06-27 DIAGNOSIS — Z853 Personal history of malignant neoplasm of breast: Secondary | ICD-10-CM | POA: Diagnosis not present

## 2018-06-28 DIAGNOSIS — L570 Actinic keratosis: Secondary | ICD-10-CM | POA: Diagnosis not present

## 2018-06-28 DIAGNOSIS — X32XXXD Exposure to sunlight, subsequent encounter: Secondary | ICD-10-CM | POA: Diagnosis not present

## 2018-06-28 DIAGNOSIS — Z85828 Personal history of other malignant neoplasm of skin: Secondary | ICD-10-CM | POA: Diagnosis not present

## 2018-06-28 DIAGNOSIS — Z08 Encounter for follow-up examination after completed treatment for malignant neoplasm: Secondary | ICD-10-CM | POA: Diagnosis not present

## 2018-07-01 ENCOUNTER — Other Ambulatory Visit (HOSPITAL_COMMUNITY): Payer: Medicare Other

## 2018-07-01 ENCOUNTER — Ambulatory Visit (HOSPITAL_COMMUNITY): Payer: Medicare Other

## 2018-07-01 ENCOUNTER — Ambulatory Visit (HOSPITAL_COMMUNITY): Payer: Medicare Other | Admitting: Hematology

## 2018-07-07 ENCOUNTER — Inpatient Hospital Stay (HOSPITAL_BASED_OUTPATIENT_CLINIC_OR_DEPARTMENT_OTHER): Payer: Medicare Other | Admitting: Hematology

## 2018-07-07 ENCOUNTER — Other Ambulatory Visit: Payer: Self-pay

## 2018-07-07 ENCOUNTER — Encounter (HOSPITAL_COMMUNITY): Payer: Self-pay | Admitting: Hematology

## 2018-07-07 ENCOUNTER — Inpatient Hospital Stay (HOSPITAL_COMMUNITY): Payer: Medicare Other

## 2018-07-07 ENCOUNTER — Inpatient Hospital Stay (HOSPITAL_COMMUNITY): Payer: Medicare Other | Attending: Hematology

## 2018-07-07 VITALS — BP 128/37 | HR 65 | Temp 98.0°F | Resp 16 | Wt 193.8 lb

## 2018-07-07 DIAGNOSIS — Z9221 Personal history of antineoplastic chemotherapy: Secondary | ICD-10-CM

## 2018-07-07 DIAGNOSIS — C50922 Malignant neoplasm of unspecified site of left male breast: Secondary | ICD-10-CM

## 2018-07-07 DIAGNOSIS — Z923 Personal history of irradiation: Secondary | ICD-10-CM | POA: Insufficient documentation

## 2018-07-07 DIAGNOSIS — I10 Essential (primary) hypertension: Secondary | ICD-10-CM | POA: Insufficient documentation

## 2018-07-07 DIAGNOSIS — Z79810 Long term (current) use of selective estrogen receptor modulators (SERMs): Secondary | ICD-10-CM | POA: Diagnosis not present

## 2018-07-07 DIAGNOSIS — G629 Polyneuropathy, unspecified: Secondary | ICD-10-CM | POA: Insufficient documentation

## 2018-07-07 DIAGNOSIS — Z9484 Stem cells transplant status: Secondary | ICD-10-CM | POA: Diagnosis not present

## 2018-07-07 DIAGNOSIS — C9000 Multiple myeloma not having achieved remission: Secondary | ICD-10-CM | POA: Diagnosis not present

## 2018-07-07 DIAGNOSIS — C7951 Secondary malignant neoplasm of bone: Secondary | ICD-10-CM | POA: Diagnosis not present

## 2018-07-07 DIAGNOSIS — Z5112 Encounter for antineoplastic immunotherapy: Secondary | ICD-10-CM | POA: Diagnosis not present

## 2018-07-07 LAB — CBC WITH DIFFERENTIAL/PLATELET
Basophils Absolute: 0.1 10*3/uL (ref 0.0–0.1)
Basophils Relative: 1 %
EOS ABS: 0.8 10*3/uL — AB (ref 0.0–0.7)
EOS PCT: 12 %
HCT: 31.2 % — ABNORMAL LOW (ref 39.0–52.0)
Hemoglobin: 10.4 g/dL — ABNORMAL LOW (ref 13.0–17.0)
LYMPHS ABS: 0.9 10*3/uL (ref 0.7–4.0)
LYMPHS PCT: 14 %
MCH: 34 pg (ref 26.0–34.0)
MCHC: 33.3 g/dL (ref 30.0–36.0)
MCV: 102 fL — AB (ref 78.0–100.0)
MONO ABS: 0.6 10*3/uL (ref 0.1–1.0)
Monocytes Relative: 9 %
Neutro Abs: 3.9 10*3/uL (ref 1.7–7.7)
Neutrophils Relative %: 64 %
Platelets: 169 10*3/uL (ref 150–400)
RBC: 3.06 MIL/uL — ABNORMAL LOW (ref 4.22–5.81)
RDW: 13.3 % (ref 11.5–15.5)
WBC: 6.2 10*3/uL (ref 4.0–10.5)

## 2018-07-07 LAB — COMPREHENSIVE METABOLIC PANEL
ALBUMIN: 3.8 g/dL (ref 3.5–5.0)
ALK PHOS: 31 U/L — AB (ref 38–126)
ALT: 13 U/L (ref 0–44)
AST: 17 U/L (ref 15–41)
Anion gap: 8 (ref 5–15)
BILIRUBIN TOTAL: 0.4 mg/dL (ref 0.3–1.2)
BUN: 18 mg/dL (ref 8–23)
CALCIUM: 9.4 mg/dL (ref 8.9–10.3)
CO2: 29 mmol/L (ref 22–32)
Chloride: 105 mmol/L (ref 98–111)
Creatinine, Ser: 1.37 mg/dL — ABNORMAL HIGH (ref 0.61–1.24)
GFR calc Af Amer: 59 mL/min — ABNORMAL LOW (ref >60)
GFR calc non Af Amer: 51 mL/min — ABNORMAL LOW (ref >60)
GLUCOSE: 107 mg/dL — AB (ref 70–99)
POTASSIUM: 3.8 mmol/L (ref 3.5–5.1)
Sodium: 142 mmol/L (ref 135–145)
Total Protein: 6.1 g/dL — ABNORMAL LOW (ref 6.5–8.1)

## 2018-07-07 LAB — LACTATE DEHYDROGENASE: LDH: 136 U/L (ref 98–192)

## 2018-07-07 MED ORDER — SODIUM CHLORIDE 0.9 % IV SOLN: Freq: Once | INTRAVENOUS | Status: AC

## 2018-07-07 MED ORDER — DEXTROSE 5 % IV SOLN
34.0000 mg/m2 | Freq: Once | INTRAVENOUS | Status: AC
  Administered 2018-07-07: 70 mg via INTRAVENOUS
  Filled 2018-07-07: qty 30

## 2018-07-07 MED ORDER — PROCHLORPERAZINE MALEATE 10 MG PO TABS
10.0000 mg | ORAL_TABLET | Freq: Once | ORAL | Status: AC
  Administered 2018-07-07: 10 mg via ORAL
  Filled 2018-07-07: qty 1

## 2018-07-07 MED ORDER — SODIUM CHLORIDE 0.9 % IV SOLN
Freq: Once | INTRAVENOUS | Status: AC
  Administered 2018-07-07: 10:00:00 via INTRAVENOUS

## 2018-07-07 MED ORDER — SODIUM CHLORIDE 0.9 % IV SOLN
Freq: Once | INTRAVENOUS | Status: AC
  Administered 2018-07-07: 11:00:00 via INTRAVENOUS

## 2018-07-07 MED ORDER — HEPARIN SOD (PORK) LOCK FLUSH 100 UNIT/ML IV SOLN
500.0000 [IU] | Freq: Once | INTRAVENOUS | Status: AC | PRN
  Administered 2018-07-07: 500 [IU]
  Filled 2018-07-07: qty 5

## 2018-07-07 MED ORDER — SODIUM CHLORIDE 0.9% FLUSH
10.0000 mL | INTRAVENOUS | Status: DC | PRN
  Administered 2018-07-07: 10 mL
  Filled 2018-07-07: qty 10

## 2018-07-07 MED ORDER — ACETAMINOPHEN 325 MG PO TABS
650.0000 mg | ORAL_TABLET | Freq: Once | ORAL | Status: AC
Start: 2018-07-07 — End: 2018-07-07
  Administered 2018-07-07: 650 mg via ORAL
  Filled 2018-07-07: qty 2

## 2018-07-07 NOTE — Progress Notes (Signed)
[address] [place], [place] [id number]   CLINIC:  Medical Oncology/Hematology  PCP:  [name], MD [place] [address] [phone]   REASON FOR VISIT:  Follow-up for Multiple myeloma s/p stem cell transplant  CURRENT THERAPY: Carfilzomib every 2 weeks.               BRIEF ONCOLOGIC HISTORY:    Breast cancer, male ([place])   12/31/2009 Initial Biopsy    Biopsy of L breast       12/31/2009 Pathology Results    Invasive ductal carcinoma, ER/PR+, HER 2 negative      12/31/2009 Imaging    Ultrasound showing a 2.43 x 1.85 x 3 cm hypoechoic spiculated mass in the 12 o clock L breast retroareolar region      01/01/2010 -  Anti-estrogen oral therapy    Tamoxifen 20 mg daily      01/06/2010 Imaging    Bone scan abnormal uptake in the diaphysis of the R humerus, abnormal in the R third, fifth and sixth ribs, lesion also noted in the sternum.      02/03/2010 Surgery    Rod placement and fixation of R humerus by Dr. [name]      02/05/2010 - 02/18/2010 Radiation Therapy    30Gy in 10 fractions of 3 Gy per fraction to R pathologic fracture      03/11/2010 -  Chemotherapy    Denosumab monthly, now every 3 months. Started at [facility]       06/09/2016 Imaging    Three hypermetabolic osseous lesions in the sternum, left ilium and right ilium, as discussed above, likely represent osseous metastases. At this time, these are not recognizable on the CT images. 2. No extra skeletal metastatic disease identified in the neck, chest, abdomen or pelvis.      10/13/2016 Progression    PET shows various new and enlarging osseous metastatic lesions with no definite extra osseous metastatic disease currently identified.       12/31/2016 Progression    1. Multifocal hypermetabolic osseous metastases throughout the axial and proximal appendicular skeleton, which are increased in size, number and metabolism since 10/13/2016 PET-CT. 2. 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 3. No additional sites of hypermetabolic metastatic disease. 4. Chronic right mastoid sinusitis. 5. Aortic atherosclerosis.  One vessel coronary atherosclerosis.       Multiple myeloma ([place])   02/12/2017 Bone Marrow Biopsy    The marrow was variably cellular with large peritrabecular aggregates of kappa restricted plasma cells (66% by aspirate, 30% by Cd138). Cytogenetics +11.       03/01/2017 - 06/29/2017 Chemotherapy    RVD       05/26/2017 Bone Marrow Biopsy    Performed at [facility]:  Plasma cell myeloma in a 30% cellularmarrow with decreased trilineage hematopoiesis and 42% kappalight chain restricted plasma cells on the aspirate smears andlarge aggregates on the core biopsy.       07/12/2017 - 09/01/2017 Chemotherapy    3 cycles of carfizolmib/cyclophosphamide/dexamethasone        10/08/2017 Bone Marrow Transplant    Autotransplant at [place]: Cancer Staging Multiple myeloma [facility]) Staging form: Plasma Cell Myeloma and Plasma Cell Disorders, AJCC 8th Edition - Clinical stage from 05/03/2017: Beta-2-microglobulin (mg/L): 3.5, Albumin (g/dL): 3.4, ISS: Stage  II, High-risk cytogenetics: Absent, LDH: Not assessed - Signed by Baird Cancer, PA-C on 05/03/2017    INTERVAL HISTORY:  Johnny Lucas 72 y.o. male returns for routine follow-up for multiple myeloma and consideration for next cycle of chemotherapy. Patient is here today with his wife. He has done well since starting treatment and his energy levels are improving. Has slight numbness and tingling in his toes that are stable. Denies nausea, vomiting, or diarrhea. Denies and fevers or infections recently. Denies any new pains. Overall, he tells me he has been feeling pretty well. Energy levels 75%; appetite 75%. He feels ready for next cycle  of chemo today.     REVIEW OF SYSTEMS:  Review of Systems  Constitutional: Positive for fatigue.  HENT:  Negative.   Eyes: Negative.   Respiratory: Negative.   Cardiovascular: Negative.   Gastrointestinal: Negative.   Endocrine: Negative.   Genitourinary: Negative.    Musculoskeletal: Negative.   Skin: Negative.   Neurological: Negative.   Hematological: Negative.   Psychiatric/Behavioral: Negative.      PAST MEDICAL/SURGICAL HISTORY:  Past Medical History:  Diagnosis Date  . Anxiety   . Bone metastases ([place]) 09/10/2016  . Breast cancer ([place]) 2011   Stave IV breast cancer; radiation and tamoxifen  . Breast cancer, male ([place])    Stave IV breast cancer; radiation and tamoxifen  Overview:  Left breast ca with mets bone  Overview:  METS TO BONE  . GERD (gastroesophageal reflux disease)   . Hypertension   . Macular degeneration   . Multiple myeloma ([place]) 02/18/2017   Past Surgical History:  Procedure Laterality Date  . BACK SURGERY    . HERNIA REPAIR    . PORTACATH PLACEMENT Left 06/10/2018   Procedure: INSERTION PORT-A-CATH;  Surgeon: Virl Cagey, MD;  Location: [facility];  Service: General;  Laterality: Left;  . right arm surgery       SOCIAL HISTORY:  Social History   Socioeconomic History  . Marital status: Married    Spouse name: Not on file  . Number of children: 3  . Years of education: Not on file  . Highest education level: Not on file  Occupational History  . Not on file  Social Needs  . Financial resource strain: Not on file  . Food insecurity:    Worry: Not on file    Inability: Not on file  . Transportation needs:    Medical: Not on file    Non-medical: Not on file  Tobacco Use  . Smoking status: Never Smoker  . Smokeless tobacco: Former Network engineer and Sexual Activity  . Alcohol use: Yes    Alcohol/week: 14.4 oz    Types: 24 Cans of beer per week  . Drug use: No  . Sexual activity: Not on file  Lifestyle  . Physical activity:     Days per week: Not on file    Minutes per session: Not on file  . Stress: Not on file  Relationships  . Social connections:    Talks on phone: Not on file    Gets together: Not on file    Attends religious service: Not on file    Active member of club or organization: Not on file    Attends meetings of clubs or organizations: Not on file    Relationship status: Not on file  . Intimate partner violence:    Fear of current or ex partner: Not on file    Emotionally abused: Not on  file    Physically abused: Not on file    Forced sexual activity: Not on file  Other Topics Concern  . Not on file  Social History Narrative  . Not on file    FAMILY HISTORY:  Family History  Problem Relation Age of Onset  . Stroke Mother   . Cancer Maternal Aunt        cancer NOS; died in her 28s  . Lung cancer Maternal Uncle        smoker    CURRENT MEDICATIONS:  Outpatient Encounter Medications as of 07/07/2018  Medication Sig Note  . acyclovir (ZOVIRAX) 400 MG tablet Take 400 mg by mouth 2 (two) times daily.   Marland Kitchen albuterol (PROVENTIL HFA;VENTOLIN HFA) 108 (90 Base) MCG/ACT inhaler Inhale 2 puffs into the lungs every 6 (six) hours as needed for wheezing or shortness of breath.   . ALPRAZolam (XANAX) 0.5 MG tablet TAKE 1 TABLET BY MOUTH AT BEDTIME AS NEEDED FOR ANXIETY   . aspirin EC 81 MG tablet Take 162 mg by mouth daily.  09/10/2016: Received from: [facility]: Take 162 mg by mouth daily.  . calcium-vitamin D (OSCAL WITH D) 500-200 MG-UNIT TABS tablet TAKE TWO TABLETS BY MOUTH DAILY   . Carfilzomib (KYPROLIS IV) Inject into the vein. Day 1 and 2, day 8 and 9, day 15 and 16 every 28 days for 2 months   . denosumab (XGEVA) 120 MG/1.7ML SOLN injection Inject 120 mg into the skin once.   . docusate sodium (COLACE) 100 MG capsule Take 1 capsule (100 mg total) by mouth 2 (two) times daily as needed.   . folic acid (FOLVITE) 1 MG tablet Take 1 mg by mouth daily.    Marland Kitchen gabapentin (NEURONTIN)  300 MG capsule Take two times a day, then increase to three times a day in a few weeks. (Patient taking differently: Take 300 mg by mouth 2 (two) times daily. )   . guaiFENesin (MUCINEX) 600 MG 12 hr tablet Take 600 mg by mouth 2 (two) times daily as needed for cough.   . loratadine (CLARITIN) 10 MG tablet Take 1 tablet (10 mg total) by mouth daily.   . metoprolol succinate (TOPROL-XL) 50 MG 24 hr tablet Take 50 mg by mouth daily.    . Multiple Vitamin (MULTIVITAMIN WITH MINERALS) TABS tablet Take 1 tablet by mouth daily.   . ondansetron (ZOFRAN) 8 MG tablet Take 1 tablet (8 mg total) by mouth every 8 (eight) hours as needed for nausea or vomiting.   . pantoprazole (PROTONIX) 40 MG tablet Take 40 mg by mouth every evening.    . prochlorperazine (COMPAZINE) 10 MG tablet Take 1 tablet (10 mg total) by mouth every 6 (six) hours as needed for nausea or vomiting.   . tamoxifen (NOLVADEX) 20 MG tablet TAKE 1 TABLET EVERY DAY    No facility-administered encounter medications on file as of 07/07/2018.     ALLERGIES:  No Known Allergies   PHYSICAL EXAM:  ECOG Performance status: 1  VITAL SIGNS: BP: 128/37, P: 65, R: 16, REMP: 98.0, 02: 95%  Physical Exam   LABORATORY DATA:  I have reviewed the labs as listed.  CBC    Component Value Date/Time   WBC 6.2 07/07/2018 0837   RBC 3.06 (L) 07/07/2018 0837   HGB 10.4 (L) 07/07/2018 0837   HCT 31.2 (L) 07/07/2018 0837   PLT 169 07/07/2018 0837   MCV 102.0 (H) 07/07/2018 0837   MCH 34.0 07/07/2018  0837   MCHC 33.3 07/07/2018 0837   RDW 13.3 07/07/2018 0837   LYMPHSABS 0.9 07/07/2018 0837   MONOABS 0.6 07/07/2018 0837   EOSABS 0.8 (H) 07/07/2018 0837   BASOSABS 0.1 07/07/2018 0837   CMP Latest Ref Rng & Units 07/07/2018 06/23/2018 06/13/2018  Glucose 70 - 99 mg/dL 107(H) 119(H) 110(H)  BUN 8 - 23 mg/dL '18 16 17  '$ Creatinine 0.61 - 1.24 mg/dL 1.37(H) 1.61(H) 1.40(H)  Sodium 135 - 145 mmol/L 142 140 139  Potassium 3.5 - 5.1 mmol/L 3.8 4.1 4.2    Chloride 98 - 111 mmol/L 105 105 110  CO2 22 - 32 mmol/L '29 26 24  '$ Calcium 8.9 - 10.3 mg/dL 9.4 9.4 8.6(L)  Total Protein 6.5 - 8.1 g/dL 6.1(L) 6.5 5.9(L)  Total Bilirubin 0.3 - 1.2 mg/dL 0.4 0.5 0.6  Alkaline Phos 38 - 126 U/L 31(L) 33(L) 28(L)  AST 15 - 41 U/L '17 24 20  '$ ALT 0 - 44 U/L 13 37 15(L)        ASSESSMENT & PLAN:   Multiple myeloma ([name] 1.  Stage II IgG kappa multiple myeloma (Dx February 2018): -RVD from 03/01/2017 through 06/29/2017 -Chemotherapy changed to 3 cycles of CCyD from 07/12/2017-04/2017 to obtain deeper response - Status post auto stem cell transplant on 10/08/2017 -Stable M spike after transplant, started on CCyD, cycle 1 on 02/14/2018, cycle 2 on 03/14/2018 -His M spike after 2 cycles was undetectable, when measured at [facility] last week.  He was recommended to have maintenance with carfilzomib 70 mg/m square on days 1 and 15 every 28 days by Dr. Norma Fredrickson. -He received his first carfilzomib '70mg'$ /m2 on 04/20/2018, developed fever with chills 3 hours later with weakness in the extremities.  He was admitted to the hospital, received 1 unit of blood transfusion.  Blood cultures turned out to be negative.  He was febrile after hospitalization and was sent home. - He is tolerating reduced dose carfilzomib at 35 mg/m very well.  Mild fatigue is stable.  No fevers or infections were noted.  I have discussed the results of M spike from 06/13/2018 which has come down to 0.1 g/dL.  Kappa by lambda ratio is 2.74, which has also improved.  He will continue carfilzomib every 2 weeks at this time.  I will see him back in 8 weeks and will repeat myeloma panel 2 weeks prior to that.  2.  Metastatic left breast cancer to the bones (Dx 2011): He was initially treated with lumpectomy.  He was initiated on tamoxifen.  Tamoxifen was restarted back 30 days after his bone marrow transplant.  3.  Peripheral neuropathy: His tingling in his toes has been stable.  He is taking gabapentin 300  mg twice a day.  4.  Infection prophylaxis: He was told to continue acyclovir 400 mg twice daily.  5.  Hypertension: He is continuing metoprolol XL 50 mg daily.      Orders placed this encounter:  Orders Placed This Encounter  Procedures  . Protein electrophoresis, serum  . Lactate dehydrogenase, isoenzymes  . Immunofixation electrophoresis  . Kappa/lambda light chains  . CBC with Differential/Platelet  . Comprehensive metabolic panel      Derek Jack, MD [place] [phone]

## 2018-07-07 NOTE — Progress Notes (Signed)
Treatment given per orders. Patient tolerated it well without problems. Vitals stable and discharged home from clinic ambulatory. Follow up as scheduled.  

## 2018-07-07 NOTE — Assessment & Plan Note (Signed)
1.  Stage II IgG kappa multiple myeloma (Dx February 2018): -RVD from 03/01/2017 through 06/29/2017 -Chemotherapy changed to 3 cycles of CCyD from 07/12/2017-04/2017 to obtain deeper response - Status post auto stem cell transplant on 10/08/2017 -Stable M spike after transplant, started on CCyD, cycle 1 on 02/14/2018, cycle 2 on 03/14/2018 -His M spike after 2 cycles was undetectable, when measured at [facility] last week.  He was recommended to have maintenance with carfilzomib 70 mg/m square on days 1 and 15 every 28 days by Dr. Norma Fredrickson. -He received his first carfilzomib 54m/m2 on 04/20/2018, developed fever with chills 3 hours later with weakness in the extremities.  He was admitted to the hospital, received 1 unit of blood transfusion.  Blood cultures turned out to be negative.  He was febrile after hospitalization and was sent home. - He is tolerating reduced dose carfilzomib at 35 mg/m very well.  Mild fatigue is stable.  No fevers or infections were noted.  I have discussed the results of M spike from 06/13/2018 which has come down to 0.1 g/dL.  Kappa by lambda ratio is 2.74, which has also improved.  He will continue carfilzomib every 2 weeks at this time.  I will see him back in 8 weeks and will repeat myeloma panel 2 weeks prior to that.  2.  Metastatic left breast cancer to the bones (Dx 2011): He was initially treated with lumpectomy.  He was initiated on tamoxifen.  Tamoxifen was restarted back 30 days after his bone marrow transplant.  3.  Peripheral neuropathy: His tingling in his toes has been stable.  He is taking gabapentin 300 mg twice a day.  4.  Infection prophylaxis: He was told to continue acyclovir 400 mg twice daily.  5.  Hypertension: He is continuing metoprolol XL 50 mg daily.

## 2018-07-07 NOTE — Patient Instructions (Signed)
[facility] Discharge Instructions for Patients Receiving Chemotherapy   Beginning January 23rd 2017 lab work for the Cancer Center will be done in the  Main lab at [place] on 1st floor. If you have a lab appointment with the Cancer Center please come in thru the  Main Entrance and check in at the main information desk   Today you received the following chemotherapy agents   To help prevent nausea and vomiting after your treatment, we encourage you to take your nausea medication     If you develop nausea and vomiting, or diarrhea that is not controlled by your medication, call the clinic.  The clinic phone number is [phone]. Office hours are Monday-Friday 8:30am-5:00pm.  BELOW ARE SYMPTOMS THAT SHOULD BE REPORTED IMMEDIATELY:  *FEVER GREATER THAN 101.0 F  *CHILLS WITH OR WITHOUT FEVER  NAUSEA AND VOMITING THAT IS NOT CONTROLLED WITH YOUR NAUSEA MEDICATION  *UNUSUAL SHORTNESS OF BREATH  *UNUSUAL BRUISING OR BLEEDING  TENDERNESS IN MOUTH AND THROAT WITH OR WITHOUT PRESENCE OF ULCERS  *URINARY PROBLEMS  *BOWEL PROBLEMS  UNUSUAL RASH Items with * indicate a potential emergency and should be followed up as soon as possible. If you have an emergency after office hours please contact your primary care physician or go to the nearest emergency department.  Please call the clinic during office hours if you have any questions or concerns.   You may also contact the Patient Navigator at [phone] should you have any questions or need assistance in obtaining follow up care.      Resources For Cancer Patients and their Caregivers ? American Cancer Society: Can assist with transportation, wigs, general needs, runs Look Good Feel Better.        1-888-227-6333 ? Cancer Care: Provides financial assistance, online support groups, medication/co-pay assistance.  1-800-813-HOPE (4673) ? [facility] Assists [place] cancer  patients and their families through emotional , educational and financial support.  336-427-4357 ? [place] DSS Where to apply for food stamps, Medicaid and utility assistance. 336-342-1394 ? [employer]: Transportation to medical appointments. 336-347-2287 ? Social Security Administration: May apply for disability if have a Stage IV cancer. 336-342-7796 1-800-772-1213 ? [place] [facility]ging, Disability and Transit Services: Assists with nutrition, care and transit needs. 336-349-2343         

## 2018-07-08 LAB — PROTEIN ELECTROPHORESIS, SERUM
A/G RATIO SPE: 1.7 (ref 0.7–1.7)
ALPHA-2-GLOBULIN: 0.6 g/dL (ref 0.4–1.0)
Albumin ELP: 3.5 g/dL (ref 2.9–4.4)
Alpha-1-Globulin: 0.3 g/dL (ref 0.0–0.4)
Beta Globulin: 0.8 g/dL (ref 0.7–1.3)
GLOBULIN, TOTAL: 2.1 g/dL — AB (ref 2.2–3.9)
Gamma Globulin: 0.4 g/dL (ref 0.4–1.8)
M-Spike, %: 0.2 g/dL — ABNORMAL HIGH
Total Protein ELP: 5.6 g/dL — ABNORMAL LOW (ref 6.0–8.5)

## 2018-07-08 LAB — KAPPA/LAMBDA LIGHT CHAINS
Kappa free light chain: 14.3 mg/L (ref 3.3–19.4)
Kappa, lambda light chain ratio: 1.93 — ABNORMAL HIGH (ref 0.26–1.65)
LAMDA FREE LIGHT CHAINS: 7.4 mg/L (ref 5.7–26.3)

## 2018-07-08 LAB — IGG, IGA, IGM
IGA: 8 mg/dL — AB (ref 61–437)
IgG (Immunoglobin G), Serum: 376 mg/dL — ABNORMAL LOW (ref 700–1600)
IgM (Immunoglobulin M), Srm: 26 mg/dL (ref 20–172)

## 2018-07-08 LAB — BETA 2 MICROGLOBULIN, SERUM: Beta-2 Microglobulin: 2.9 mg/L — ABNORMAL HIGH (ref 0.6–2.4)

## 2018-07-10 LAB — IMMUNOFIXATION ELECTROPHORESIS
IGA: 8 mg/dL — AB (ref 61–437)
IGG (IMMUNOGLOBIN G), SERUM: 366 mg/dL — AB (ref 700–1600)
IGM (IMMUNOGLOBULIN M), SRM: 25 mg/dL (ref 20–172)
Total Protein ELP: 5.7 g/dL — ABNORMAL LOW (ref 6.0–8.5)

## 2018-07-18 ENCOUNTER — Other Ambulatory Visit (HOSPITAL_COMMUNITY): Payer: Self-pay | Admitting: *Deleted

## 2018-07-18 DIAGNOSIS — C9000 Multiple myeloma not having achieved remission: Secondary | ICD-10-CM

## 2018-07-18 MED ORDER — ALPRAZOLAM 0.5 MG PO TABS: ORAL_TABLET | ORAL | 0 refills | Status: DC

## 2018-07-19 ENCOUNTER — Other Ambulatory Visit (HOSPITAL_COMMUNITY): Payer: Self-pay

## 2018-07-19 DIAGNOSIS — C7951 Secondary malignant neoplasm of bone: Secondary | ICD-10-CM

## 2018-07-19 DIAGNOSIS — C9000 Multiple myeloma not having achieved remission: Secondary | ICD-10-CM

## 2018-07-21 ENCOUNTER — Inpatient Hospital Stay (HOSPITAL_COMMUNITY): Payer: Medicare Other

## 2018-07-21 ENCOUNTER — Other Ambulatory Visit (HOSPITAL_COMMUNITY): Payer: Self-pay | Admitting: Pharmacist

## 2018-07-21 ENCOUNTER — Other Ambulatory Visit: Payer: Self-pay

## 2018-07-21 ENCOUNTER — Encounter (HOSPITAL_COMMUNITY): Payer: Self-pay

## 2018-07-21 VITALS — BP 149/78 | HR 65 | Temp 98.0°F | Resp 18 | Wt 194.6 lb

## 2018-07-21 DIAGNOSIS — C9000 Multiple myeloma not having achieved remission: Secondary | ICD-10-CM

## 2018-07-21 DIAGNOSIS — Z5112 Encounter for antineoplastic immunotherapy: Secondary | ICD-10-CM | POA: Diagnosis not present

## 2018-07-21 DIAGNOSIS — C50922 Malignant neoplasm of unspecified site of left male breast: Secondary | ICD-10-CM | POA: Diagnosis not present

## 2018-07-21 DIAGNOSIS — C7951 Secondary malignant neoplasm of bone: Secondary | ICD-10-CM

## 2018-07-21 DIAGNOSIS — Z9484 Stem cells transplant status: Secondary | ICD-10-CM | POA: Diagnosis not present

## 2018-07-21 DIAGNOSIS — G629 Polyneuropathy, unspecified: Secondary | ICD-10-CM | POA: Diagnosis not present

## 2018-07-21 LAB — COMPREHENSIVE METABOLIC PANEL
ALK PHOS: 31 U/L — AB (ref 38–126)
ALT: 10 U/L (ref 0–44)
AST: 17 U/L (ref 15–41)
Albumin: 3.8 g/dL (ref 3.5–5.0)
Anion gap: 7 (ref 5–15)
BUN: 19 mg/dL (ref 8–23)
CALCIUM: 10.2 mg/dL (ref 8.9–10.3)
CHLORIDE: 106 mmol/L (ref 98–111)
CO2: 28 mmol/L (ref 22–32)
CREATININE: 1.39 mg/dL — AB (ref 0.61–1.24)
GFR calc Af Amer: 58 mL/min — ABNORMAL LOW (ref >60)
GFR calc non Af Amer: 50 mL/min — ABNORMAL LOW (ref >60)
Glucose, Bld: 114 mg/dL — ABNORMAL HIGH (ref 70–99)
Potassium: 4.1 mmol/L (ref 3.5–5.1)
SODIUM: 141 mmol/L (ref 135–145)
Total Bilirubin: 0.6 mg/dL (ref 0.3–1.2)
Total Protein: 6.3 g/dL — ABNORMAL LOW (ref 6.5–8.1)

## 2018-07-21 LAB — CBC WITH DIFFERENTIAL/PLATELET
BASOS ABS: 0 10*3/uL (ref 0.0–0.1)
Basophils Relative: 0 %
EOS ABS: 0.7 10*3/uL (ref 0.0–0.7)
EOS PCT: 9 %
HCT: 31.1 % — ABNORMAL LOW (ref 39.0–52.0)
HEMOGLOBIN: 10.4 g/dL — AB (ref 13.0–17.0)
LYMPHS ABS: 0.9 10*3/uL (ref 0.7–4.0)
LYMPHS PCT: 13 %
MCH: 34.1 pg — ABNORMAL HIGH (ref 26.0–34.0)
MCHC: 33.4 g/dL (ref 30.0–36.0)
MCV: 102 fL — AB (ref 78.0–100.0)
Monocytes Absolute: 0.5 10*3/uL (ref 0.1–1.0)
Monocytes Relative: 7 %
NEUTROS PCT: 71 %
Neutro Abs: 4.9 10*3/uL (ref 1.7–7.7)
PLATELETS: 187 10*3/uL (ref 150–400)
RBC: 3.05 MIL/uL — AB (ref 4.22–5.81)
RDW: 14.1 % (ref 11.5–15.5)
WBC: 6.9 10*3/uL (ref 4.0–10.5)

## 2018-07-21 MED ORDER — PROCHLORPERAZINE MALEATE 10 MG PO TABS: 10.0000 mg | ORAL_TABLET | Freq: Once | ORAL | Status: DC

## 2018-07-21 MED ORDER — SODIUM CHLORIDE 0.9% FLUSH
10.0000 mL | INTRAVENOUS | Status: DC | PRN
  Administered 2018-07-21: 10 mL
  Filled 2018-07-21: qty 10

## 2018-07-21 MED ORDER — DEXTROSE 5 % IV SOLN
34.0000 mg/m2 | Freq: Once | INTRAVENOUS | Status: AC
  Administered 2018-07-21: 70 mg via INTRAVENOUS
  Filled 2018-07-21: qty 5

## 2018-07-21 MED ORDER — DEXAMETHASONE SODIUM PHOSPHATE 10 MG/ML IJ SOLN
INTRAMUSCULAR | Status: AC
  Filled 2018-07-21: qty 1

## 2018-07-21 MED ORDER — HEPARIN SOD (PORK) LOCK FLUSH 100 UNIT/ML IV SOLN
500.0000 [IU] | Freq: Once | INTRAVENOUS | Status: AC | PRN
  Administered 2018-07-21: 500 [IU]

## 2018-07-21 MED ORDER — PROCHLORPERAZINE MALEATE 10 MG PO TABS
ORAL_TABLET | ORAL | Status: AC
  Filled 2018-07-21: qty 1

## 2018-07-21 MED ORDER — SODIUM CHLORIDE 0.9 % IV SOLN
Freq: Once | INTRAVENOUS | Status: AC
Start: 2018-07-21 — End: 2018-07-21
  Administered 2018-07-21: 12:00:00 via INTRAVENOUS

## 2018-07-21 MED ORDER — DENOSUMAB 120 MG/1.7ML ~~LOC~~ SOLN: 120.0000 mg | Freq: Once | SUBCUTANEOUS | Status: DC

## 2018-07-21 MED ORDER — DENOSUMAB 120 MG/1.7ML ~~LOC~~ SOLN
120.0000 mg | Freq: Once | SUBCUTANEOUS | Status: DC
  Filled 2018-07-21: qty 1.7

## 2018-07-21 MED ORDER — ACETAMINOPHEN 325 MG PO TABS
ORAL_TABLET | ORAL | Status: AC
  Filled 2018-07-21: qty 2

## 2018-07-21 MED ORDER — DEXAMETHASONE SODIUM PHOSPHATE 10 MG/ML IJ SOLN
8.0000 mg | Freq: Once | INTRAMUSCULAR | Status: AC
  Administered 2018-07-21: 8 mg via INTRAVENOUS

## 2018-07-21 MED ORDER — ACETAMINOPHEN 325 MG PO TABS
650.0000 mg | ORAL_TABLET | Freq: Once | ORAL | Status: AC
  Administered 2018-07-21: 650 mg via ORAL

## 2018-07-21 MED ORDER — SODIUM CHLORIDE 0.9 % IV SOLN
Freq: Once | INTRAVENOUS | Status: AC
  Administered 2018-07-21: 10:00:00 via INTRAVENOUS

## 2018-07-21 NOTE — Patient Instructions (Signed)
[facility] Discharge Instructions for Patients Receiving Chemotherapy   Beginning January 23rd 2017 lab work for the Cancer Center will be done in the  Main lab at [place] on 1st floor. If you have a lab appointment with the Cancer Center please come in thru the  Main Entrance and check in at the main information desk   Today you received the following chemotherapy agents   To help prevent nausea and vomiting after your treatment, we encourage you to take your nausea medication     If you develop nausea and vomiting, or diarrhea that is not controlled by your medication, call the clinic.  The clinic phone number is [phone]. Office hours are Monday-Friday 8:30am-5:00pm.  BELOW ARE SYMPTOMS THAT SHOULD BE REPORTED IMMEDIATELY:  *FEVER GREATER THAN 101.0 F  *CHILLS WITH OR WITHOUT FEVER  NAUSEA AND VOMITING THAT IS NOT CONTROLLED WITH YOUR NAUSEA MEDICATION  *UNUSUAL SHORTNESS OF BREATH  *UNUSUAL BRUISING OR BLEEDING  TENDERNESS IN MOUTH AND THROAT WITH OR WITHOUT PRESENCE OF ULCERS  *URINARY PROBLEMS  *BOWEL PROBLEMS  UNUSUAL RASH Items with * indicate a potential emergency and should be followed up as soon as possible. If you have an emergency after office hours please contact your primary care physician or go to the nearest emergency department.  Please call the clinic during office hours if you have any questions or concerns.   You may also contact the Patient Navigator at [phone] should you have any questions or need assistance in obtaining follow up care.      Resources For Cancer Patients and their Caregivers ? American Cancer Society: Can assist with transportation, wigs, general needs, runs Look Good Feel Better.        1-888-227-6333 ? Cancer Care: Provides financial assistance, online support groups, medication/co-pay assistance.  1-800-813-HOPE (4673) ? [facility] Assists [place] cancer  patients and their families through emotional , educational and financial support.  336-427-4357 ? [place] DSS Where to apply for food stamps, Medicaid and utility assistance. 336-342-1394 ? [employer]: Transportation to medical appointments. 336-347-2287 ? Social Security Administration: May apply for disability if have a Stage IV cancer. 336-342-7796 1-800-772-1213 ? [place] [facility], Dis[employer] and Transit Services: Assists with nutrition, care and transit needs. [phone]         

## 2018-07-21 NOTE — Progress Notes (Signed)
Treatment given per orders. Patient tolerated it well without problems. Vitals stable and discharged home from clinic ambulatory. Follow up as scheduled.  

## 2018-07-22 ENCOUNTER — Other Ambulatory Visit (HOSPITAL_COMMUNITY): Payer: Medicare Other

## 2018-07-22 ENCOUNTER — Encounter (HOSPITAL_COMMUNITY): Payer: Self-pay

## 2018-07-22 ENCOUNTER — Inpatient Hospital Stay (HOSPITAL_COMMUNITY): Payer: Medicare Other

## 2018-07-22 ENCOUNTER — Other Ambulatory Visit: Payer: Self-pay

## 2018-07-22 VITALS — BP 163/83 | HR 74 | Temp 98.1°F | Resp 20

## 2018-07-22 DIAGNOSIS — C7951 Secondary malignant neoplasm of bone: Secondary | ICD-10-CM

## 2018-07-22 DIAGNOSIS — Z9484 Stem cells transplant status: Secondary | ICD-10-CM | POA: Diagnosis not present

## 2018-07-22 DIAGNOSIS — C9000 Multiple myeloma not having achieved remission: Secondary | ICD-10-CM | POA: Diagnosis not present

## 2018-07-22 DIAGNOSIS — Z5112 Encounter for antineoplastic immunotherapy: Secondary | ICD-10-CM | POA: Diagnosis not present

## 2018-07-22 DIAGNOSIS — G629 Polyneuropathy, unspecified: Secondary | ICD-10-CM | POA: Diagnosis not present

## 2018-07-22 DIAGNOSIS — C50922 Malignant neoplasm of unspecified site of left male breast: Secondary | ICD-10-CM | POA: Diagnosis not present

## 2018-07-22 MED ORDER — DENOSUMAB 120 MG/1.7ML ~~LOC~~ SOLN
120.0000 mg | Freq: Once | SUBCUTANEOUS | Status: AC
  Administered 2018-07-22: 120 mg via SUBCUTANEOUS
  Filled 2018-07-22: qty 1.7

## 2018-07-22 NOTE — Progress Notes (Signed)
Johnny Lucas presents today for injection per the provider's orders.  Xgeva administration without incident; see MAR for injection details.  Patient tolerated procedure well and without incident.  No questions or complaints noted at this time.  Discharged ambulatory.

## 2018-08-04 ENCOUNTER — Other Ambulatory Visit: Payer: Self-pay

## 2018-08-04 ENCOUNTER — Inpatient Hospital Stay (HOSPITAL_COMMUNITY): Payer: Medicare Other

## 2018-08-04 ENCOUNTER — Inpatient Hospital Stay (HOSPITAL_COMMUNITY): Payer: Medicare Other | Attending: Hematology

## 2018-08-04 ENCOUNTER — Encounter (HOSPITAL_COMMUNITY): Payer: Self-pay

## 2018-08-04 VITALS — BP 166/70 | HR 63 | Temp 98.6°F | Resp 16 | Wt 199.4 lb

## 2018-08-04 DIAGNOSIS — Z5112 Encounter for antineoplastic immunotherapy: Secondary | ICD-10-CM | POA: Diagnosis not present

## 2018-08-04 DIAGNOSIS — C9000 Multiple myeloma not having achieved remission: Secondary | ICD-10-CM

## 2018-08-04 DIAGNOSIS — Z170 Estrogen receptor positive status [ER+]: Secondary | ICD-10-CM

## 2018-08-04 DIAGNOSIS — Z79899 Other long term (current) drug therapy: Secondary | ICD-10-CM | POA: Diagnosis not present

## 2018-08-04 DIAGNOSIS — C50922 Malignant neoplasm of unspecified site of left male breast: Secondary | ICD-10-CM

## 2018-08-04 DIAGNOSIS — C7951 Secondary malignant neoplasm of bone: Secondary | ICD-10-CM

## 2018-08-04 LAB — CBC WITH DIFFERENTIAL/PLATELET
BASOS ABS: 0 10*3/uL (ref 0.0–0.1)
BASOS PCT: 0 %
EOS ABS: 0.4 10*3/uL (ref 0.0–0.7)
Eosinophils Relative: 7 %
HCT: 30.4 % — ABNORMAL LOW (ref 39.0–52.0)
Hemoglobin: 10 g/dL — ABNORMAL LOW (ref 13.0–17.0)
Lymphocytes Relative: 13 %
Lymphs Abs: 0.9 10*3/uL (ref 0.7–4.0)
MCH: 34.2 pg — ABNORMAL HIGH (ref 26.0–34.0)
MCHC: 32.9 g/dL (ref 30.0–36.0)
MCV: 104.1 fL — ABNORMAL HIGH (ref 78.0–100.0)
MONO ABS: 0.5 10*3/uL (ref 0.1–1.0)
MONOS PCT: 8 %
NEUTROS ABS: 4.7 10*3/uL (ref 1.7–7.7)
NEUTROS PCT: 72 %
Platelets: 185 10*3/uL (ref 150–400)
RBC: 2.92 MIL/uL — ABNORMAL LOW (ref 4.22–5.81)
RDW: 14.8 % (ref 11.5–15.5)
WBC: 6.5 10*3/uL (ref 4.0–10.5)

## 2018-08-04 LAB — COMPREHENSIVE METABOLIC PANEL
ALBUMIN: 3.7 g/dL (ref 3.5–5.0)
ALT: 14 U/L (ref 0–44)
ANION GAP: 6 (ref 5–15)
AST: 14 U/L — AB (ref 15–41)
Alkaline Phosphatase: 28 U/L — ABNORMAL LOW (ref 38–126)
BUN: 14 mg/dL (ref 8–23)
CHLORIDE: 109 mmol/L (ref 98–111)
CO2: 24 mmol/L (ref 22–32)
Calcium: 8.3 mg/dL — ABNORMAL LOW (ref 8.9–10.3)
Creatinine, Ser: 1.18 mg/dL (ref 0.61–1.24)
GFR calc Af Amer: >60 mL/min (ref >60)
GFR calc non Af Amer: >60 mL/min (ref >60)
GLUCOSE: 104 mg/dL — AB (ref 70–99)
POTASSIUM: 3.9 mmol/L (ref 3.5–5.1)
SODIUM: 139 mmol/L (ref 135–145)
Total Bilirubin: 0.6 mg/dL (ref 0.3–1.2)
Total Protein: 5.9 g/dL — ABNORMAL LOW (ref 6.5–8.1)

## 2018-08-04 MED ORDER — ACETAMINOPHEN 325 MG PO TABS
ORAL_TABLET | ORAL | Status: AC
  Filled 2018-08-04: qty 2

## 2018-08-04 MED ORDER — DEXAMETHASONE SODIUM PHOSPHATE 10 MG/ML IJ SOLN
8.0000 mg | Freq: Once | INTRAMUSCULAR | Status: DC
Start: 2018-08-04 — End: 2018-08-04

## 2018-08-04 MED ORDER — ACETAMINOPHEN 325 MG PO TABS
650.0000 mg | ORAL_TABLET | Freq: Once | ORAL | Status: AC
  Administered 2018-08-04: 650 mg via ORAL

## 2018-08-04 MED ORDER — HEPARIN SOD (PORK) LOCK FLUSH 100 UNIT/ML IV SOLN
500.0000 [IU] | Freq: Once | INTRAVENOUS | Status: AC | PRN
  Administered 2018-08-04: 500 [IU]

## 2018-08-04 MED ORDER — SODIUM CHLORIDE 0.9% FLUSH
10.0000 mL | INTRAVENOUS | Status: DC | PRN
  Administered 2018-08-04: 10 mL
  Filled 2018-08-04: qty 10

## 2018-08-04 MED ORDER — DEXTROSE 5 % IV SOLN
34.0000 mg/m2 | Freq: Once | INTRAVENOUS | Status: AC
  Administered 2018-08-04: 70 mg via INTRAVENOUS
  Filled 2018-08-04: qty 30

## 2018-08-04 MED ORDER — TAMOXIFEN CITRATE 20 MG PO TABS: 20.0000 mg | ORAL_TABLET | Freq: Every day | ORAL | 1 refills | Status: DC

## 2018-08-04 MED ORDER — SODIUM CHLORIDE 0.9 % IV SOLN
Freq: Once | INTRAVENOUS | Status: AC
  Administered 2018-08-04: 11:00:00 via INTRAVENOUS

## 2018-08-04 MED ORDER — DEXAMETHASONE SODIUM PHOSPHATE 100 MG/10ML IJ SOLN
8.0000 mg | Freq: Once | INTRAMUSCULAR | Status: AC
  Administered 2018-08-04: 8 mg via INTRAVENOUS
  Filled 2018-08-04: qty 0.8

## 2018-08-04 NOTE — Patient Instructions (Signed)
[facility] Discharge Instructions for Patients Receiving Chemotherapy   Beginning January 23rd 2017 lab work for the Cancer Center will be done in the  Main lab at [place] on 1st floor. If you have a lab appointment with the Cancer Center please come in thru the  Main Entrance and check in at the main information desk   Today you received the following chemotherapy agents   To help prevent nausea and vomiting after your treatment, we encourage you to take your nausea medication     If you develop nausea and vomiting, or diarrhea that is not controlled by your medication, call the clinic.  The clinic phone number is [phone]. Office hours are Monday-Friday 8:30am-5:00pm.  BELOW ARE SYMPTOMS THAT SHOULD BE REPORTED IMMEDIATELY:  *FEVER GREATER THAN 101.0 F  *CHILLS WITH OR WITHOUT FEVER  NAUSEA AND VOMITING THAT IS NOT CONTROLLED WITH YOUR NAUSEA MEDICATION  *UNUSUAL SHORTNESS OF BREATH  *UNUSUAL BRUISING OR BLEEDING  TENDERNESS IN MOUTH AND THROAT WITH OR WITHOUT PRESENCE OF ULCERS  *URINARY PROBLEMS  *BOWEL PROBLEMS  UNUSUAL RASH Items with * indicate a potential emergency and should be followed up as soon as possible. If you have an emergency after office hours please contact your primary care physician or go to the nearest emergency department.  Please call the clinic during office hours if you have any questions or concerns.   You may also contact the Patient Navigator at [phone] should you have any questions or need assistance in obtaining follow up care.      Resources For Cancer Patients and their Caregivers ? American Cancer Society: Can assist with transportation, wigs, general needs, runs Look Good Feel Better.        1-888-227-6333 ? Cancer Care: Provides financial assistance, online support groups, medication/co-pay assistance.  1-800-813-HOPE (4673) ? [facility] Assists [place] cancer  patients and their families through emotional , educational and financial support.  336-427-4357 ? [place] DSS Where to apply for food stamps, Medicaid and utility assistance. 336-342-1394 ? [employer]: Transportation to medical appointments. 336-347-2287 ? Social Security Administration: May apply for disability if have a Stage IV cancer. 336-342-7796 1-800-772-1213 ? [place] [facility]ging, Disability and Transit Services: Assists with nutrition, care and transit needs. [phone]         

## 2018-08-04 NOTE — Progress Notes (Signed)
Labs reviewed by Dr.Katragadda today. Proceed with treatment today.   Treatment given per orders. Patient tolerated it well without problems. Vitals stable and discharged home from clinic ambulatory. Follow up as scheduled.

## 2018-08-16 ENCOUNTER — Other Ambulatory Visit (HOSPITAL_COMMUNITY): Payer: Self-pay | Admitting: Hematology

## 2018-08-18 ENCOUNTER — Inpatient Hospital Stay (HOSPITAL_COMMUNITY): Payer: Medicare Other

## 2018-08-18 ENCOUNTER — Other Ambulatory Visit: Payer: Self-pay

## 2018-08-18 ENCOUNTER — Encounter (HOSPITAL_COMMUNITY): Payer: Self-pay

## 2018-08-18 VITALS — BP 161/72 | HR 59 | Temp 98.2°F | Resp 18 | Wt 198.6 lb

## 2018-08-18 DIAGNOSIS — Z5112 Encounter for antineoplastic immunotherapy: Secondary | ICD-10-CM | POA: Diagnosis not present

## 2018-08-18 DIAGNOSIS — C9000 Multiple myeloma not having achieved remission: Secondary | ICD-10-CM | POA: Diagnosis not present

## 2018-08-18 DIAGNOSIS — Z79899 Other long term (current) drug therapy: Secondary | ICD-10-CM | POA: Diagnosis not present

## 2018-08-18 LAB — CBC WITH DIFFERENTIAL/PLATELET
BASOS ABS: 0 10*3/uL (ref 0.0–0.1)
BASOS PCT: 1 %
Eosinophils Absolute: 0.4 10*3/uL (ref 0.0–0.7)
Eosinophils Relative: 7 %
HEMATOCRIT: 31.7 % — AB (ref 39.0–52.0)
HEMOGLOBIN: 10.5 g/dL — AB (ref 13.0–17.0)
Lymphocytes Relative: 17 %
Lymphs Abs: 0.9 10*3/uL (ref 0.7–4.0)
MCH: 34.9 pg — ABNORMAL HIGH (ref 26.0–34.0)
MCHC: 33.1 g/dL (ref 30.0–36.0)
MCV: 105.3 fL — ABNORMAL HIGH (ref 78.0–100.0)
MONOS PCT: 8 %
Monocytes Absolute: 0.4 10*3/uL (ref 0.1–1.0)
NEUTROS ABS: 3.6 10*3/uL (ref 1.7–7.7)
NEUTROS PCT: 67 %
Platelets: 174 10*3/uL (ref 150–400)
RBC: 3.01 MIL/uL — AB (ref 4.22–5.81)
RDW: 14.1 % (ref 11.5–15.5)
WBC: 5.3 10*3/uL (ref 4.0–10.5)

## 2018-08-18 LAB — COMPREHENSIVE METABOLIC PANEL
ALBUMIN: 4.1 g/dL (ref 3.5–5.0)
ALK PHOS: 30 U/L — AB (ref 38–126)
ALT: 13 U/L (ref 0–44)
AST: 15 U/L (ref 15–41)
Anion gap: 8 (ref 5–15)
BILIRUBIN TOTAL: 0.5 mg/dL (ref 0.3–1.2)
BUN: 13 mg/dL (ref 8–23)
CALCIUM: 9.2 mg/dL (ref 8.9–10.3)
CO2: 28 mmol/L (ref 22–32)
CREATININE: 1.39 mg/dL — AB (ref 0.61–1.24)
Chloride: 105 mmol/L (ref 98–111)
GFR calc Af Amer: 58 mL/min — ABNORMAL LOW (ref >60)
GFR calc non Af Amer: 50 mL/min — ABNORMAL LOW (ref >60)
GLUCOSE: 109 mg/dL — AB (ref 70–99)
Potassium: 4 mmol/L (ref 3.5–5.1)
Sodium: 141 mmol/L (ref 135–145)
TOTAL PROTEIN: 6.2 g/dL — AB (ref 6.5–8.1)

## 2018-08-18 MED ORDER — SODIUM CHLORIDE 0.9 % IV SOLN
Freq: Once | INTRAVENOUS | Status: AC
  Administered 2018-08-18: 11:00:00 via INTRAVENOUS

## 2018-08-18 MED ORDER — HEPARIN SOD (PORK) LOCK FLUSH 100 UNIT/ML IV SOLN
500.0000 [IU] | Freq: Once | INTRAVENOUS | Status: AC | PRN
  Administered 2018-08-18: 500 [IU]
  Filled 2018-08-18 (×2): qty 5

## 2018-08-18 MED ORDER — SODIUM CHLORIDE 0.9% FLUSH
10.0000 mL | INTRAVENOUS | Status: DC | PRN
  Administered 2018-08-18: 10 mL
  Filled 2018-08-18: qty 10

## 2018-08-18 MED ORDER — SODIUM CHLORIDE 0.9 % IV SOLN
Freq: Once | INTRAVENOUS | Status: AC
  Administered 2018-08-18: 12:00:00 via INTRAVENOUS

## 2018-08-18 MED ORDER — DEXAMETHASONE SODIUM PHOSPHATE 10 MG/ML IJ SOLN
8.0000 mg | Freq: Once | INTRAMUSCULAR | Status: AC
  Administered 2018-08-18: 8 mg via INTRAVENOUS
  Filled 2018-08-18: qty 1

## 2018-08-18 MED ORDER — ACETAMINOPHEN 325 MG PO TABS
650.0000 mg | ORAL_TABLET | Freq: Once | ORAL | Status: AC
  Administered 2018-08-18: 650 mg via ORAL
  Filled 2018-08-18: qty 2

## 2018-08-18 MED ORDER — DEXTROSE 5 % IV SOLN
34.0000 mg/m2 | Freq: Once | INTRAVENOUS | Status: AC
  Administered 2018-08-18: 70 mg via INTRAVENOUS
  Filled 2018-08-18: qty 5

## 2018-08-18 NOTE — Progress Notes (Signed)
Labs reviewed with MD. Noted creatinine elevated. Proceed with treatment.  Treatment given per orders. Patient tolerated it well without problems. Vitals stable and discharged home from clinic ambulatory. Follow up as scheduled.

## 2018-08-18 NOTE — Patient Instructions (Signed)
[facility] Discharge Instructions for Patients Receiving Chemotherapy   Beginning January 23rd 2017 lab work for the Cancer Center will be done in the  Main lab at [place] on 1st floor. If you have a lab appointment with the Cancer Center please come in thru the  Main Entrance and check in at the main information desk   Today you received the following chemotherapy agents   To help prevent nausea and vomiting after your treatment, we encourage you to take your nausea medication     If you develop nausea and vomiting, or diarrhea that is not controlled by your medication, call the clinic.  The clinic phone number is [phone]. Office hours are Monday-Friday 8:30am-5:00pm.  BELOW ARE SYMPTOMS THAT SHOULD BE REPORTED IMMEDIATELY:  *FEVER GREATER THAN 101.0 F  *CHILLS WITH OR WITHOUT FEVER  NAUSEA AND VOMITING THAT IS NOT CONTROLLED WITH YOUR NAUSEA MEDICATION  *UNUSUAL SHORTNESS OF BREATH  *UNUSUAL BRUISING OR BLEEDING  TENDERNESS IN MOUTH AND THROAT WITH OR WITHOUT PRESENCE OF ULCERS  *URINARY PROBLEMS  *BOWEL PROBLEMS  UNUSUAL RASH Items with * indicate a potential emergency and should be followed up as soon as possible. If you have an emergency after office hours please contact your primary care physician or go to the nearest emergency department.  Please call the clinic during office hours if you have any questions or concerns.   You may also contact the Patient Navigator at [phone] should you have any questions or need assistance in obtaining follow up care.      Resources For Cancer Patients and their Caregivers ? American Cancer Society: Can assist with transportation, wigs, general needs, runs Look Good Feel Better.        1-888-227-6333 ? Cancer Care: Provides financial assistance, online support groups, medication/co-pay assistance.  1-800-813-HOPE (4673) ? [facility] Assists [place] cancer  patients and their families through emotional , educational and financial support.  336-427-4357 ? [place] DSS Where to apply for food stamps, Medicaid and utility assistance. 336-342-1394 ? [employer]: Transportation to medical appointments. 336-347-2287 ? Social Security Administration: May apply for disability if have a Stage IV cancer. 336-342-7796 1-800-772-1213 ? [place] [facility]ging, Disability and Transit Services: Assists with nutrition, care and transit needs. [phone]         

## 2018-08-19 ENCOUNTER — Inpatient Hospital Stay (HOSPITAL_COMMUNITY): Payer: Medicare Other

## 2018-08-19 ENCOUNTER — Other Ambulatory Visit (HOSPITAL_COMMUNITY): Payer: Medicare Other

## 2018-08-19 VITALS — BP 143/74 | HR 62 | Temp 98.1°F | Resp 18

## 2018-08-19 DIAGNOSIS — C7951 Secondary malignant neoplasm of bone: Secondary | ICD-10-CM

## 2018-08-19 DIAGNOSIS — Z79899 Other long term (current) drug therapy: Secondary | ICD-10-CM | POA: Diagnosis not present

## 2018-08-19 DIAGNOSIS — C9000 Multiple myeloma not having achieved remission: Secondary | ICD-10-CM

## 2018-08-19 DIAGNOSIS — Z5112 Encounter for antineoplastic immunotherapy: Secondary | ICD-10-CM | POA: Diagnosis not present

## 2018-08-19 LAB — PROTEIN ELECTROPHORESIS, SERUM
A/G RATIO SPE: 1.8 — AB (ref 0.7–1.7)
ALPHA-1-GLOBULIN: 0.3 g/dL (ref 0.0–0.4)
Albumin ELP: 3.7 g/dL (ref 2.9–4.4)
Alpha-2-Globulin: 0.6 g/dL (ref 0.4–1.0)
Beta Globulin: 0.9 g/dL (ref 0.7–1.3)
Gamma Globulin: 0.4 g/dL (ref 0.4–1.8)
Globulin, Total: 2.1 g/dL — ABNORMAL LOW (ref 2.2–3.9)
M-Spike, %: 0.2 g/dL — ABNORMAL HIGH
Total Protein ELP: 5.8 g/dL — ABNORMAL LOW (ref 6.0–8.5)

## 2018-08-19 LAB — KAPPA/LAMBDA LIGHT CHAINS
KAPPA, LAMDA LIGHT CHAIN RATIO: 1.2 (ref 0.26–1.65)
Kappa free light chain: 13.8 mg/L (ref 3.3–19.4)
LAMDA FREE LIGHT CHAINS: 11.5 mg/L (ref 5.7–26.3)

## 2018-08-19 MED ORDER — DENOSUMAB 120 MG/1.7ML ~~LOC~~ SOLN
120.0000 mg | Freq: Once | SUBCUTANEOUS | Status: AC
  Administered 2018-08-19: 120 mg via SUBCUTANEOUS
  Filled 2018-08-19: qty 1.7

## 2018-08-19 NOTE — Progress Notes (Signed)
Johnny Lucas tolerated Xgeva injection well without complaints or incident. Calcium 9.2 today and pt denied any tooth or jaw pain and no recent or future dental visits. VSS Pt discharged self ambulatory in satisfactory condition accompanied by his wife

## 2018-08-19 NOTE — Patient Instructions (Signed)
[facility] Discharge Instructions  Received Xgeva injection today. Follow-up as scheduled. Call clinic for any questions or concerns   Thank you for choosing  Cancer Center at [facility] to provide your oncology and hematology care.  To afford each patient quality time with our provider, please arrive at least 15 minutes before your scheduled appointment time.   If you have a lab appointment with the Cancer Center please come in thru the  Main Entrance and check in at the main information desk  You need to re-schedule your appointment should you arrive 10 or more minutes late.  We strive to give you quality time with our providers, and arriving late affects you and other patients whose appointments are after yours.  Also, if you no show three or more times for appointments you may be dismissed from the clinic at the providers discretion.     Again, thank you for choosing [facility].  Our hope is that these requests will decrease the amount of time that you wait before being seen by our physicians.       _____________________________________________________________  Should you have questions after your visit to  Cancer Center, please contact our office at [phone] between the hours of 8:00 a.m. and 4:30 p.m.  Voicemails left after 4:00 p.m. will not be returned until the following business day.  For prescription refill requests, have your pharmacy contact our office and allow 72 hours.    Cancer Center Support Programs:   > Cancer Support Group  2nd Tuesday of the month 1pm-2pm, Journey Room   

## 2018-08-22 LAB — IMMUNOFIXATION ELECTROPHORESIS
IgA: 10 mg/dL — ABNORMAL LOW (ref 61–437)
IgG (Immunoglobin G), Serum: 371 mg/dL — ABNORMAL LOW (ref 700–1600)
IgM (Immunoglobulin M), Srm: 47 mg/dL (ref 20–172)
TOTAL PROTEIN ELP: 6 g/dL (ref 6.0–8.5)

## 2018-08-24 LAB — LACTATE DEHYDROGENASE, ISOENZYMES
LDH 1: 28 % (ref 17–32)
LDH 2: 35 % (ref 25–40)
LDH 3: 22 % (ref 17–27)
LDH 4: 7 % (ref 5–13)
LDH 5: 8 % (ref 4–20)
LDH ISOENZYMES, TOTAL: 192 IU/L (ref 121–224)

## 2018-08-25 ENCOUNTER — Other Ambulatory Visit (HOSPITAL_COMMUNITY): Payer: Medicare Other

## 2018-09-01 ENCOUNTER — Encounter (HOSPITAL_COMMUNITY): Payer: Self-pay | Admitting: Hematology

## 2018-09-01 ENCOUNTER — Inpatient Hospital Stay (HOSPITAL_COMMUNITY): Payer: Medicare Other | Attending: Hematology

## 2018-09-01 ENCOUNTER — Inpatient Hospital Stay (HOSPITAL_BASED_OUTPATIENT_CLINIC_OR_DEPARTMENT_OTHER): Payer: Medicare Other | Admitting: Hematology

## 2018-09-01 ENCOUNTER — Inpatient Hospital Stay (HOSPITAL_COMMUNITY): Payer: Medicare Other

## 2018-09-01 ENCOUNTER — Other Ambulatory Visit: Payer: Self-pay

## 2018-09-01 VITALS — BP 160/78 | HR 66 | Temp 98.5°F | Resp 16 | Wt 202.0 lb

## 2018-09-01 VITALS — BP 159/71 | HR 57 | Temp 98.1°F | Resp 18

## 2018-09-01 DIAGNOSIS — I10 Essential (primary) hypertension: Secondary | ICD-10-CM

## 2018-09-01 DIAGNOSIS — Z79810 Long term (current) use of selective estrogen receptor modulators (SERMs): Secondary | ICD-10-CM | POA: Insufficient documentation

## 2018-09-01 DIAGNOSIS — Z801 Family history of malignant neoplasm of trachea, bronchus and lung: Secondary | ICD-10-CM

## 2018-09-01 DIAGNOSIS — Z9484 Stem cells transplant status: Secondary | ICD-10-CM | POA: Insufficient documentation

## 2018-09-01 DIAGNOSIS — C9000 Multiple myeloma not having achieved remission: Secondary | ICD-10-CM

## 2018-09-01 DIAGNOSIS — C50822 Malignant neoplasm of overlapping sites of left male breast: Secondary | ICD-10-CM | POA: Diagnosis not present

## 2018-09-01 DIAGNOSIS — G629 Polyneuropathy, unspecified: Secondary | ICD-10-CM

## 2018-09-01 DIAGNOSIS — K219 Gastro-esophageal reflux disease without esophagitis: Secondary | ICD-10-CM | POA: Insufficient documentation

## 2018-09-01 DIAGNOSIS — Z923 Personal history of irradiation: Secondary | ICD-10-CM

## 2018-09-01 DIAGNOSIS — Z7982 Long term (current) use of aspirin: Secondary | ICD-10-CM | POA: Diagnosis not present

## 2018-09-01 DIAGNOSIS — C7951 Secondary malignant neoplasm of bone: Secondary | ICD-10-CM | POA: Diagnosis not present

## 2018-09-01 DIAGNOSIS — C9002 Multiple myeloma in relapse: Secondary | ICD-10-CM | POA: Diagnosis not present

## 2018-09-01 DIAGNOSIS — Z5111 Encounter for antineoplastic chemotherapy: Secondary | ICD-10-CM

## 2018-09-01 DIAGNOSIS — Z170 Estrogen receptor positive status [ER+]: Secondary | ICD-10-CM | POA: Insufficient documentation

## 2018-09-01 DIAGNOSIS — Z800 Family history of malignant neoplasm of digestive organs: Secondary | ICD-10-CM | POA: Insufficient documentation

## 2018-09-01 DIAGNOSIS — Z9221 Personal history of antineoplastic chemotherapy: Secondary | ICD-10-CM

## 2018-09-01 DIAGNOSIS — Z79899 Other long term (current) drug therapy: Secondary | ICD-10-CM | POA: Insufficient documentation

## 2018-09-01 LAB — CBC WITH DIFFERENTIAL/PLATELET
BASOS ABS: 0 10*3/uL (ref 0.0–0.1)
BASOS PCT: 1 %
Eosinophils Absolute: 0.4 10*3/uL (ref 0.0–0.7)
Eosinophils Relative: 6 %
HEMATOCRIT: 31.3 % — AB (ref 39.0–52.0)
Hemoglobin: 10.3 g/dL — ABNORMAL LOW (ref 13.0–17.0)
Lymphocytes Relative: 16 %
Lymphs Abs: 1.1 10*3/uL (ref 0.7–4.0)
MCH: 34.7 pg — ABNORMAL HIGH (ref 26.0–34.0)
MCHC: 32.9 g/dL (ref 30.0–36.0)
MCV: 105.4 fL — ABNORMAL HIGH (ref 78.0–100.0)
MONO ABS: 0.6 10*3/uL (ref 0.1–1.0)
MONOS PCT: 9 %
NEUTROS ABS: 4.8 10*3/uL (ref 1.7–7.7)
Neutrophils Relative %: 70 %
PLATELETS: 187 10*3/uL (ref 150–400)
RBC: 2.97 MIL/uL — ABNORMAL LOW (ref 4.22–5.81)
RDW: 13.7 % (ref 11.5–15.5)
WBC: 6.9 10*3/uL (ref 4.0–10.5)

## 2018-09-01 LAB — COMPREHENSIVE METABOLIC PANEL
ALBUMIN: 3.9 g/dL (ref 3.5–5.0)
ALT: 13 U/L (ref 0–44)
ANION GAP: 7 (ref 5–15)
AST: 15 U/L (ref 15–41)
Alkaline Phosphatase: 28 U/L — ABNORMAL LOW (ref 38–126)
BILIRUBIN TOTAL: 0.6 mg/dL (ref 0.3–1.2)
BUN: 18 mg/dL (ref 8–23)
CHLORIDE: 107 mmol/L (ref 98–111)
CO2: 27 mmol/L (ref 22–32)
Calcium: 8.9 mg/dL (ref 8.9–10.3)
Creatinine, Ser: 1.4 mg/dL — ABNORMAL HIGH (ref 0.61–1.24)
GFR calc Af Amer: 57 mL/min — ABNORMAL LOW (ref >60)
GFR, EST NON AFRICAN AMERICAN: 49 mL/min — AB (ref >60)
GLUCOSE: 108 mg/dL — AB (ref 70–99)
POTASSIUM: 4 mmol/L (ref 3.5–5.1)
Sodium: 141 mmol/L (ref 135–145)
TOTAL PROTEIN: 6.1 g/dL — AB (ref 6.5–8.1)

## 2018-09-01 MED ORDER — SODIUM CHLORIDE 0.9 % IV SOLN
10.0000 mg | Freq: Once | INTRAVENOUS | Status: AC
  Administered 2018-09-01: 10 mg via INTRAVENOUS
  Filled 2018-09-01: qty 1

## 2018-09-01 MED ORDER — ACETAMINOPHEN 325 MG PO TABS
650.0000 mg | ORAL_TABLET | Freq: Once | ORAL | Status: AC
  Administered 2018-09-01: 650 mg via ORAL

## 2018-09-01 MED ORDER — DEXTROSE 5 % IV SOLN
58.0000 mg/m2 | Freq: Once | INTRAVENOUS | Status: AC
  Administered 2018-09-01: 120 mg via INTRAVENOUS
  Filled 2018-09-01: qty 30

## 2018-09-01 MED ORDER — ACETAMINOPHEN 325 MG PO TABS
ORAL_TABLET | ORAL | Status: AC
  Filled 2018-09-01: qty 2

## 2018-09-01 MED ORDER — SODIUM CHLORIDE 0.9 % IV SOLN
Freq: Once | INTRAVENOUS | Status: AC
  Administered 2018-09-01: 11:00:00 via INTRAVENOUS

## 2018-09-01 MED ORDER — HEPARIN SOD (PORK) LOCK FLUSH 100 UNIT/ML IV SOLN
500.0000 [IU] | Freq: Once | INTRAVENOUS | Status: AC | PRN
  Administered 2018-09-01: 500 [IU]

## 2018-09-01 MED ORDER — SODIUM CHLORIDE 0.9 % IV SOLN
Freq: Once | INTRAVENOUS | Status: DC
  Administered 2018-09-01: 13:00:00 via INTRAVENOUS

## 2018-09-01 MED ORDER — SODIUM CHLORIDE 0.9% FLUSH
10.0000 mL | INTRAVENOUS | Status: DC | PRN
  Administered 2018-09-01: 10 mL
  Filled 2018-09-01: qty 10

## 2018-09-01 MED ORDER — DEXAMETHASONE SODIUM PHOSPHATE 10 MG/ML IJ SOLN: 10.0000 mg | Freq: Once | INTRAMUSCULAR | Status: DC

## 2018-09-01 NOTE — Progress Notes (Signed)
Labs reviewed in office visit today . Proceed with treatmtent. Dr. Delton Coombes will increase Kyprolis today as well.

## 2018-09-01 NOTE — Assessment & Plan Note (Signed)
1.  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  He was recommended to have maintenance with carfilzomib 70 mg/m square on days 1 and 15 every 28 days by Dr. Norma Fredrickson. -He received his first carfilzomib '70mg'$ /m2 on 04/20/2018, developed fever with chills 3 hours later with weakness in the extremities.  He was admitted to the hospital, received 1 unit of blood transfusion.  Blood cultures turned out to be negative.  He was febrile after hospitalization and was sent home. - He is tolerating reduced dose carfilzomib at 35 mg/m very well.  He has done quite well since we added dexamethasone to premedicine. - I have reviewed results of his myeloma work-up from 08/18/2018.  His M spike was 0.2, stable from July.  Immunofixation was positive for IgG kappa protein.  Free light chain ratio has come down to 1.20.  This was previously 1.93.  He was evaluated by Dr. Norma Fredrickson at [facility] on 06/27/2018. - I have recommended increasing the dose of carfilzomib today to 56 mg/m.  If he continues to tolerate well, I will increase the dose to 70 mg/m in 2 weeks.  He is agreeable to this option.  We will increase the premeds to 10 mg of dexamethasone. -he will continue denosumab monthly.  I will see him back in 8 weeks for follow-up with repeat myeloma panel.  2.  Metastatic left breast cancer to the bones (Dx 2011): He was initially treated with lumpectomy.  He was initiated on tamoxifen.  Tamoxifen was restarted back 30 days after his bone marrow transplant.  3.  Peripheral neuropathy: His neuropathy has been stable.  He is continuing gabapentin  300 mg twice daily.  4.  Infection prophylaxis: He will continue acyclovir 400 mg twice daily.  5.  Hypertension: He is continuing metoprolol XL 50 mg daily.

## 2018-09-01 NOTE — Patient Instructions (Signed)
[facility] at [facility] Discharge Instructions     Thank you for choosing [facility] at [facility] to provide your oncology and hematology care.  To afford each patient quality time with our provider, please arrive at least 15 minutes before your scheduled appointment time.   If you have a lab appointment with the Cancer Center please come in thru the  Main Entrance and check in at the main information desk  You need to re-schedule your appointment should you arrive 10 or more minutes late.  We strive to give you quality time with our providers, and arriving late affects you and other patients whose appointments are after yours.  Also, if you no show three or more times for appointments you may be dismissed from the clinic at the providers discretion.     Again, thank you for choosing [facility].  Our hope is that these requests will decrease the amount of time that you wait before being seen by our physicians.       _____________________________________________________________  Should you have questions after your visit to [facility], please contact our office at [phone] between the hours of 8:00 a.m. and 4:30 p.m.  Voicemails left after 4:00 p.m. will not be returned until the following business day.  For prescription refill requests, have your pharmacy contact our office and allow 72 hours.    Cancer Center Support Programs:   > Cancer Support Group  2nd Tuesday of the month 1pm-2pm, Journey Room    

## 2018-09-01 NOTE — Progress Notes (Signed)
[place], [place] [address]   CLINIC:  Medical Oncology/Hematology  PCP:  [name], MD [employer] [place] [address] [phone]   REASON FOR VISIT: Follow-up for multiple myeloma s/p stem cell transplant   CURRENT THERAPY: Carfilzomib every 2 weeks  BRIEF ONCOLOGIC HISTORY:    Breast cancer, male ([place])   12/31/2009 Initial Biopsy    Biopsy of L breast     12/31/2009 Pathology Results    Invasive ductal carcinoma, ER/PR+, HER 2 negative    12/31/2009 Imaging    Ultrasound showing a 2.43 x 1.85 x 3 cm hypoechoic spiculated mass in the 12 o clock L breast retroareolar region    01/01/2010 -  Anti-estrogen oral therapy    Tamoxifen 20 mg daily    01/06/2010 Imaging    Bone scan abnormal uptake in the diaphysis of the R humerus, abnormal in the R third, fifth and sixth ribs, lesion also noted in the sternum.    02/03/2010 Surgery    Rod placement and fixation of R humerus by Dr. [name]    02/05/2010 - 02/18/2010 Radiation Therapy    30Gy in 10 fractions of 3 Gy per fraction to R pathologic fracture    03/11/2010 -  Chemotherapy    Denosumab monthly, now every 3 months. Started at [facility]     06/09/2016 Imaging    Three hypermetabolic osseous lesions in the sternum, left ilium and right ilium, as discussed above, likely represent osseous metastases. At this time, these are not recognizable on the CT images. 2. No extra skeletal metastatic disease identified in the neck, chest, abdomen or pelvis.    10/13/2016 Progression    PET shows various new and enlarging osseous metastatic lesions with no definite extra osseous metastatic disease currently identified.     12/31/2016 Progression    1. Multifocal hypermetabolic osseous metastases throughout the axial and proximal appendicular skeleton, which are increased in size, number and metabolism since 10/13/2016 PET-CT. 2. 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 3. No additional sites of hypermetabolic metastatic disease. 4. Chronic right mastoid sinusitis. 5. Aortic atherosclerosis.  One vessel coronary atherosclerosis.     Multiple myeloma ([place])   02/12/2017 Bone Marrow Biopsy    The marrow was variably cellular with large peritrabecular aggregates of kappa restricted plasma cells (66% by aspirate, 30% by Cd138). Cytogenetics +11.     03/01/2017 - 06/29/2017 Chemotherapy    RVD     05/26/2017 Bone Marrow Biopsy    Performed at [facility]:  Plasma cell myeloma in a 30% cellularmarrow with decreased trilineage hematopoiesis and 42% kappalight chain restricted plasma cells on the aspirate smears andlarge aggregates on the core biopsy.     07/12/2017 - 09/01/2017 Chemotherapy    3 cycles of carfizolmib/cyclophosphamide/dexamethasone      10/08/2017 Bone Marrow Transplant    Autotransplant at [facility]      CANCER STAGING: Cancer Staging Multiple myeloma [facility]) Staging form: Plasma Cell Myeloma and Plasma Cell Disorders, AJCC 8th Edition - Clinical stage from 05/03/2017: Beta-2-microglobulin (mg/L): 3.5, Albumin (g/dL): 3.4, ISS: Stage II, High-risk cytogenetics: Absent, LDH: Not assessed - Signed by Baird [facility], PA-C on 05/03/2017    INTERVAL HISTORY:  [name] 72 y.o. male returns for routine follow-up for multiple myeloma and consideration for next cycle of chemotherapy. Patient reports he is  feeling better. His severe fatigue has improved.  He denies any nausea, vomiting, or diarrhea. Denies any new pain or lumps. Denies any new cough or SOB. Denies fevers or recent infections.  Patient report appetite is 100% and he is drinking boost daily and maintaining his weight well. His energy level is 75%. He is trying to remain active at home.    REVIEW OF SYSTEMS:  Review of Systems  Constitutional: Positive for  fatigue.  Neurological: Positive for numbness (feet).  Hematological: Bruises/bleeds easily.  All other systems reviewed and are negative.    PAST MEDICAL/SURGICAL HISTORY:  Past Medical History:  Diagnosis Date  . Anxiety   . Bone metastases ([place]) 09/10/2016  . Breast cancer ([place]) 2011   Stave IV breast cancer; radiation and tamoxifen  . Breast cancer, male ([place])    Stave IV breast cancer; radiation and tamoxifen  Overview:  Left breast ca with mets bone  Overview:  METS TO BONE  . GERD (gastroesophageal reflux disease)   . Hypertension   . Macular degeneration   . Multiple myeloma ([place]) 02/18/2017   Past Surgical History:  Procedure Laterality Date  . BACK SURGERY    . HERNIA REPAIR    . PORTACATH PLACEMENT Left 06/10/2018   Procedure: INSERTION PORT-A-CATH;  Surgeon: Virl Cagey, MD;  Location: [facility];  Service: General;  Laterality: Left;  . right arm surgery       SOCIAL HISTORY:  Social History   Socioeconomic History  . Marital status: Married    Spouse name: Not on file  . Number of children: 3  . Years of education: Not on file  . Highest education level: Not on file  Occupational History  . Not on file  Social Needs  . Financial resource strain: Not on file  . Food insecurity:    Worry: Not on file    Inability: Not on file  . Transportation needs:    Medical: Not on file    Non-medical: Not on file  Tobacco Use  . Smoking status: Never Smoker  . Smokeless tobacco: Former Network engineer and Sexual Activity  . Alcohol use: Yes    Alcohol/week: 24.0 standard drinks    Types: 24 Cans of beer per week  . Drug use: No  . Sexual activity: Not on file  Lifestyle  . Physical activity:    Days per week: Not on file    Minutes per session: Not on file  . Stress: Not on file  Relationships  . Social connections:    Talks on phone: Not on file    Gets together: Not on file    Attends religious service: Not on file    Active member of club or  organization: Not on file    Attends meetings of clubs or organizations: Not on file    Relationship status: Not on file  . Intimate partner violence:    Fear of current or ex partner: Not on file    Emotionally abused: Not on file    Physically abused: Not on file    Forced sexual activity: Not on file  Other Topics Concern  . Not on file  Social History Narrative  . Not on file    FAMILY HISTORY:  Family History  Problem Relation Age of Onset  . Stroke Mother   . Cancer Maternal Aunt        cancer NOS; died in her 52s  . Lung cancer Maternal Uncle  smoker    CURRENT MEDICATIONS:  Outpatient Encounter Medications as of 09/01/2018  Medication Sig Note  . acyclovir (ZOVIRAX) 400 MG tablet Take 400 mg by mouth 2 (two) times daily.   Marland Kitchen albuterol (PROVENTIL HFA;VENTOLIN HFA) 108 (90 Base) MCG/ACT inhaler Inhale 2 puffs into the lungs every 6 (six) hours as needed for wheezing or shortness of breath.   . ALPRAZolam (XANAX) 0.5 MG tablet TAKE 1 TABLET BY MOUTH AT BEDTIME AS NEEDED FOR ANXIETY   . aspirin EC 81 MG tablet Take 162 mg by mouth daily.  09/10/2016: Received from: [place]: Take 162 mg by mouth daily.  . calcium-vitamin D (OSCAL WITH D) 500-200 MG-UNIT TABS tablet TAKE TWO TABLETS BY MOUTH DAILY   . Carfilzomib (KYPROLIS IV) Inject into the vein. Day 1 and 2, day 8 and 9, day 15 and 16 every 28 days for 2 months   . denosumab (XGEVA) 120 MG/1.7ML SOLN injection Inject 120 mg into the skin once.   . docusate sodium (COLACE) 100 MG capsule Take 1 capsule (100 mg total) by mouth 2 (two) times daily as needed.   . folic acid (FOLVITE) 1 MG tablet Take 1 mg by mouth daily.    Marland Kitchen gabapentin (NEURONTIN) 300 MG capsule Take two times a day, then increase to three times a day in a few weeks. (Patient taking differently: Take 300 mg by mouth 2 (two) times daily. )   . guaiFENesin (MUCINEX) 600 MG 12 hr tablet Take 600 mg by mouth 2 (two) times daily as needed for  cough.   . loratadine (CLARITIN) 10 MG tablet Take 1 tablet (10 mg total) by mouth daily.   . metoprolol succinate (TOPROL-XL) 50 MG 24 hr tablet Take 50 mg by mouth daily.    . Multiple Vitamin (MULTIVITAMIN WITH MINERALS) TABS tablet Take 1 tablet by mouth daily.   . ondansetron (ZOFRAN) 8 MG tablet Take 1 tablet (8 mg total) by mouth every 8 (eight) hours as needed for nausea or vomiting.   . pantoprazole (PROTONIX) 40 MG tablet Take 40 mg by mouth every evening.    . prochlorperazine (COMPAZINE) 10 MG tablet Take 1 tablet (10 mg total) by mouth every 6 (six) hours as needed for nausea or vomiting.   . tamoxifen (NOLVADEX) 20 MG tablet Take 1 tablet (20 mg total) by mouth daily.    Facility-Administered Encounter Medications as of 09/01/2018  Medication  . [COMPLETED] 0.9 %  sodium chloride infusion  . 0.9 %  sodium chloride infusion  . [COMPLETED] acetaminophen (TYLENOL) tablet 650 mg  . [COMPLETED] carfilzomib (KYPROLIS) 120 mg in dextrose 5 % 100 mL chemo infusion  . [COMPLETED] dexamethasone (DECADRON) 10 mg in sodium chloride 0.9 % 50 mL IVPB  . [COMPLETED] heparin lock flush 100 unit/mL  . sodium chloride flush (NS) 0.9 % injection 10 mL  . [DISCONTINUED] dexamethasone (DECADRON) injection 10 mg    ALLERGIES:  No Known Allergies   PHYSICAL EXAM:  ECOG Performance status: 1  Vitals:   09/01/18 0954  BP: (!) 160/78  Pulse: 66  Resp: 16  Temp: 98.5 F (36.9 C)  SpO2: 99%   Filed Weights   09/01/18 0954  Weight: 202 lb (91.6 kg)    Physical Exam  Constitutional: He is oriented to person, place, and time. He appears well-developed and well-nourished.  Cardiovascular: Normal rate, regular rhythm and normal heart sounds.  Pulmonary/Chest: Effort normal and breath sounds normal.  Neurological: He is alert and  oriented to person, place, and time.  Skin: Skin is warm and dry.  Psychiatric: He has a normal mood and affect. His behavior is normal. Judgment and thought  content normal.     LABORATORY DATA:  I have reviewed the labs as listed.  CBC    Component Value Date/Time   WBC 6.9 09/01/2018 0859   RBC 2.97 (L) 09/01/2018 0859   HGB 10.3 (L) 09/01/2018 0859   HCT 31.3 (L) 09/01/2018 0859   PLT 187 09/01/2018 0859   MCV 105.4 (H) 09/01/2018 0859   MCH 34.7 (H) 09/01/2018 0859   MCHC 32.9 09/01/2018 0859   RDW 13.7 09/01/2018 0859   LYMPHSABS 1.1 09/01/2018 0859   MONOABS 0.6 09/01/2018 0859   EOSABS 0.4 09/01/2018 0859   BASOSABS 0.0 09/01/2018 0859   CMP Latest Ref Rng & Units 09/01/2018 08/18/2018 08/04/2018  Glucose 70 - 99 mg/dL 108(H) 109(H) 104(H)  BUN 8 - 23 mg/dL 18 13 14   Creatinine 0.61 - 1.24 mg/dL 1.40(H) 1.39(H) 1.18  Sodium 135 - 145 mmol/L 141 141 139  Potassium 3.5 - 5.1 mmol/L 4.0 4.0 3.9  Chloride 98 - 111 mmol/L 107 105 109  CO2 22 - 32 mmol/L 27 28 24   Calcium 8.9 - 10.3 mg/dL 8.9 9.2 8.3(L)  Total Protein 6.5 - 8.1 g/dL 6.1(L) 6.2(L) 5.9(L)  Total Bilirubin 0.3 - 1.2 mg/dL 0.6 0.5 0.6  Alkaline Phos 38 - 126 U/L 28(L) 30(L) 28(L)  AST 15 - 41 U/L 15 15 14(L)  ALT 0 - 44 U/L 13 13 14         ASSESSMENT & PLAN:   Multiple myeloma ([name] 1.  Stage II IgG kappa multiple myeloma (Dx February 2018): -RVD from 03/01/2017 through 06/29/2017 -Chemotherapy changed to 3 cycles of CCyD from 07/12/2017-04/2017 to obtain deeper response - Status post auto stem cell transplant on 10/08/2017 -Stable M spike after transplant, started on CCyD, cycle 1 on 02/14/2018, cycle 2 on 03/14/2018 -His M spike after 2 cycles was undetectable, when measured at [facility] last week.  He was recommended to have maintenance with carfilzomib 70 mg/m square on days 1 and 15 every 28 days by Dr. Norma Fredrickson. -He received his first carfilzomib 38m/m2 on 04/20/2018, developed fever with chills 3 hours later with weakness in the extremities.  He was admitted to the hospital, received 1 unit of blood transfusion.  Blood cultures turned out to be negative.  He  was febrile after hospitalization and was sent home. - He is tolerating reduced dose carfilzomib at 35 mg/m very well.  He has done quite well since we added dexamethasone to premedicine. - I have reviewed results of his myeloma work-up from 08/18/2018.  His M spike was 0.2, stable from July.  Immunofixation was positive for IgG kappa protein.  Free light chain ratio has come down to 1.20.  This was previously 1.93.  [facility] - I have recommended increasing the dose of carfilzomib today to 56 mg/m.  If he continues to tolerate well, I will increase the dose to 70 mg/m in 2 weeks.  He is agreeable to this option.  We will increase the premeds to 10 mg of dexamethasone. -he will continue denosumab monthly.  I will see him back in 8 weeks for follow-up with repeat myeloma panel.  2.  Metastatic left breast cancer to the bones (Dx 2011): He was initially treated with lumpectomy.  He was initiated on tamoxifen.  Tamoxifen was restarted back 30 days after his bone marrow transplant.  3.  Peripheral neuropathy: His neuropathy has been stable.  He is continuing gabapentin 300 mg twice daily.  4.  Infection prophylaxis: He will continue acyclovir 400 mg twice daily.  5.  Hypertension: He is continuing metoprolol XL 50 mg daily.      Orders placed this encounter:  Orders Placed This Encounter  Procedures  . Protein electrophoresis, serum  . Immunofixation electrophoresis  . Kappa/lambda light chains  . CBC with Differential/Platelet  . Comprehensive metabolic panel  . Lactate dehydrogenase  . IgG, IgA, IgM  . SCHEDULING COMMUNICATION  . TREATMENT CONDITIONS      Derek Jack, [place] [phone]

## 2018-09-02 ENCOUNTER — Other Ambulatory Visit (HOSPITAL_COMMUNITY): Payer: Self-pay | Admitting: *Deleted

## 2018-09-02 MED ORDER — OYSTER SHELL CALCIUM/D 500-200 MG-UNIT PO TABS: 2.0000 | ORAL_TABLET | Freq: Every day | ORAL | 3 refills | Status: DC

## 2018-09-15 ENCOUNTER — Other Ambulatory Visit (HOSPITAL_COMMUNITY): Payer: Self-pay | Admitting: Nurse Practitioner

## 2018-09-15 ENCOUNTER — Inpatient Hospital Stay (HOSPITAL_COMMUNITY): Payer: Medicare Other

## 2018-09-15 VITALS — BP 152/79 | HR 63 | Temp 97.6°F | Resp 18 | Wt 200.4 lb

## 2018-09-15 DIAGNOSIS — C7951 Secondary malignant neoplasm of bone: Secondary | ICD-10-CM

## 2018-09-15 DIAGNOSIS — C9002 Multiple myeloma in relapse: Secondary | ICD-10-CM | POA: Diagnosis not present

## 2018-09-15 DIAGNOSIS — Z79899 Other long term (current) drug therapy: Secondary | ICD-10-CM | POA: Diagnosis not present

## 2018-09-15 DIAGNOSIS — C50822 Malignant neoplasm of overlapping sites of left male breast: Secondary | ICD-10-CM | POA: Diagnosis not present

## 2018-09-15 DIAGNOSIS — Z5111 Encounter for antineoplastic chemotherapy: Secondary | ICD-10-CM | POA: Diagnosis not present

## 2018-09-15 DIAGNOSIS — C9000 Multiple myeloma not having achieved remission: Secondary | ICD-10-CM

## 2018-09-15 DIAGNOSIS — Z170 Estrogen receptor positive status [ER+]: Secondary | ICD-10-CM | POA: Diagnosis not present

## 2018-09-15 LAB — COMPREHENSIVE METABOLIC PANEL
ALT: 14 U/L (ref 0–44)
ANION GAP: 8 (ref 5–15)
AST: 16 U/L (ref 15–41)
Albumin: 3.8 g/dL (ref 3.5–5.0)
Alkaline Phosphatase: 26 U/L — ABNORMAL LOW (ref 38–126)
BUN: 20 mg/dL (ref 8–23)
CHLORIDE: 106 mmol/L (ref 98–111)
CO2: 27 mmol/L (ref 22–32)
Calcium: 9 mg/dL (ref 8.9–10.3)
Creatinine, Ser: 1.49 mg/dL — ABNORMAL HIGH (ref 0.61–1.24)
GFR calc Af Amer: 53 mL/min — ABNORMAL LOW (ref >60)
GFR, EST NON AFRICAN AMERICAN: 46 mL/min — AB (ref >60)
Glucose, Bld: 120 mg/dL — ABNORMAL HIGH (ref 70–99)
POTASSIUM: 4.1 mmol/L (ref 3.5–5.1)
Sodium: 141 mmol/L (ref 135–145)
TOTAL PROTEIN: 6.2 g/dL — AB (ref 6.5–8.1)
Total Bilirubin: 0.4 mg/dL (ref 0.3–1.2)

## 2018-09-15 LAB — CBC WITH DIFFERENTIAL/PLATELET
BASOS ABS: 0 10*3/uL (ref 0.0–0.1)
BASOS PCT: 0 %
EOS PCT: 6 %
Eosinophils Absolute: 0.3 10*3/uL (ref 0.0–0.7)
HEMATOCRIT: 31.5 % — AB (ref 39.0–52.0)
Hemoglobin: 10.4 g/dL — ABNORMAL LOW (ref 13.0–17.0)
Lymphocytes Relative: 24 %
Lymphs Abs: 1.1 10*3/uL (ref 0.7–4.0)
MCH: 34.7 pg — ABNORMAL HIGH (ref 26.0–34.0)
MCHC: 33 g/dL (ref 30.0–36.0)
MCV: 105 fL — AB (ref 78.0–100.0)
MONO ABS: 0.5 10*3/uL (ref 0.1–1.0)
MONOS PCT: 10 %
NEUTROS ABS: 2.8 10*3/uL (ref 1.7–7.7)
Neutrophils Relative %: 60 %
PLATELETS: 180 10*3/uL (ref 150–400)
RBC: 3 MIL/uL — ABNORMAL LOW (ref 4.22–5.81)
RDW: 14 % (ref 11.5–15.5)
WBC: 4.7 10*3/uL (ref 4.0–10.5)

## 2018-09-15 MED ORDER — SODIUM CHLORIDE 0.9% FLUSH
10.0000 mL | INTRAVENOUS | Status: DC | PRN
  Administered 2018-09-15: 10 mL
  Filled 2018-09-15: qty 10

## 2018-09-15 MED ORDER — DEXTROSE 5 % IV SOLN
58.0000 mg/m2 | Freq: Once | INTRAVENOUS | Status: AC
  Administered 2018-09-15: 120 mg via INTRAVENOUS
  Filled 2018-09-15: qty 60

## 2018-09-15 MED ORDER — ALPRAZOLAM 0.5 MG PO TABS: ORAL_TABLET | ORAL | 0 refills | Status: DC

## 2018-09-15 MED ORDER — SODIUM CHLORIDE 0.9 % IV SOLN
Freq: Once | INTRAVENOUS | Status: AC
  Administered 2018-09-15: 10:00:00 via INTRAVENOUS

## 2018-09-15 MED ORDER — SODIUM CHLORIDE 0.9 % IV SOLN
10.0000 mg | Freq: Once | INTRAVENOUS | Status: AC
  Administered 2018-09-15: 10 mg via INTRAVENOUS
  Filled 2018-09-15: qty 1

## 2018-09-15 MED ORDER — ACETAMINOPHEN 325 MG PO TABS
650.0000 mg | ORAL_TABLET | Freq: Once | ORAL | Status: AC
  Administered 2018-09-15: 650 mg via ORAL

## 2018-09-15 MED ORDER — HEPARIN SOD (PORK) LOCK FLUSH 100 UNIT/ML IV SOLN
500.0000 [IU] | Freq: Once | INTRAVENOUS | Status: AC | PRN
  Administered 2018-09-15: 500 [IU]

## 2018-09-15 MED ORDER — ACETAMINOPHEN 325 MG PO TABS
ORAL_TABLET | ORAL | Status: AC
  Filled 2018-09-15: qty 2

## 2018-09-15 MED ORDER — SODIUM CHLORIDE 0.9 % IV SOLN
Freq: Once | INTRAVENOUS | Status: AC
  Administered 2018-09-15: 12:00:00 via INTRAVENOUS

## 2018-09-15 NOTE — Progress Notes (Signed)
5894 labs reviewed and pt approved for Kyprolis infusion today.   Johnny Lucas tolerated Kyprolis infusion without incident or complaint. VSS upon completion of treatment. Discharged in satisfactory condition in presence of wife and son.

## 2018-09-15 NOTE — Patient Instructions (Signed)
[facility] Discharge Instructions for Patients Receiving Chemotherapy   Beginning January 23rd 2017 lab work for the [facility] will be done in the  Main lab at [facility] on 1st floor. If you have a lab appointment with the [facility] please come in thru the  Main Entrance and check in at the main information desk   Today you received the following chemotherapy agents Kyprolis. Follow up as scheduled. Call clinic for any questions or concerns.    If you develop nausea and vomiting, or diarrhea that is not controlled by your medication, call the clinic.  The clinic phone number is [phone]. Office hours are Monday-Friday 8:30am-5:00pm.  BELOW ARE SYMPTOMS THAT SHOULD BE REPORTED IMMEDIATELY:  *FEVER GREATER THAN 101.0 F  *CHILLS WITH OR WITHOUT FEVER  NAUSEA AND VOMITING THAT IS NOT CONTROLLED WITH YOUR NAUSEA MEDICATION  *UNUSUAL SHORTNESS OF BREATH  *UNUSUAL BRUISING OR BLEEDING  TENDERNESS IN MOUTH AND THROAT WITH OR WITHOUT PRESENCE OF ULCERS  *URINARY PROBLEMS  *BOWEL PROBLEMS  UNUSUAL RASH Items with * indicate a potential emergency and should be followed up as soon as possible. If you have an emergency after office hours please contact your primary care physician or go to the nearest emergency department.  Please call the clinic during office hours if you have any questions or concerns.   You may also contact the Patient Navigator at [phone] should you have any questions or need assistance in obtaining follow up care.      Resources For Cancer Patients and their Caregivers ? American Cancer Society: Can assist with transportation, wigs, general needs, runs Look Good Feel Better.        954-571-3138 ? Cancer Care: Provides financial assistance, online support groups, medication/co-pay assistance.  1-800-813-HOPE 7725360747) ? [facility] Assists [place] cancer patients and their families through  emotional , educational and financial support.  279-277-1478 ? [place] DSS Where to apply for food stamps, Medicaid and utility assistance. 727-464-4899 ? RCATS: Transportation to medical appointments. 516-771-5339 ? Social Security Administration: May apply for disability if have a Stage IV cancer. [phone] [phone] ? [employer], Disability and Transit Services: Assists with nutrition, care and transit needs. 636-313-1810

## 2018-09-16 ENCOUNTER — Inpatient Hospital Stay (HOSPITAL_COMMUNITY): Payer: Medicare Other

## 2018-09-16 ENCOUNTER — Other Ambulatory Visit (HOSPITAL_COMMUNITY): Payer: Medicare Other

## 2018-09-16 VITALS — BP 150/84 | HR 73 | Temp 97.6°F | Resp 18

## 2018-09-16 DIAGNOSIS — C7951 Secondary malignant neoplasm of bone: Secondary | ICD-10-CM

## 2018-09-16 DIAGNOSIS — Z5111 Encounter for antineoplastic chemotherapy: Secondary | ICD-10-CM | POA: Diagnosis not present

## 2018-09-16 DIAGNOSIS — Z170 Estrogen receptor positive status [ER+]: Secondary | ICD-10-CM | POA: Diagnosis not present

## 2018-09-16 DIAGNOSIS — Z79899 Other long term (current) drug therapy: Secondary | ICD-10-CM | POA: Diagnosis not present

## 2018-09-16 DIAGNOSIS — C9002 Multiple myeloma in relapse: Secondary | ICD-10-CM | POA: Diagnosis not present

## 2018-09-16 DIAGNOSIS — C50822 Malignant neoplasm of overlapping sites of left male breast: Secondary | ICD-10-CM | POA: Diagnosis not present

## 2018-09-16 DIAGNOSIS — C9000 Multiple myeloma not having achieved remission: Secondary | ICD-10-CM

## 2018-09-16 MED ORDER — DENOSUMAB 120 MG/1.7ML ~~LOC~~ SOLN
120.0000 mg | Freq: Once | SUBCUTANEOUS | Status: AC
  Administered 2018-09-16: 120 mg via SUBCUTANEOUS
  Filled 2018-09-16: qty 1.7

## 2018-09-16 NOTE — Patient Instructions (Signed)
[place] at [facility] Discharge Instructions  xgeva given today  Follow up as scheduled.   Thank you for choosing [facility] at [facility] to provide your oncology and hematology care.  To afford each patient quality time with our provider, please arrive at least 15 minutes before your scheduled appointment time.   If you have a lab appointment with the [facility] please come in thru the  Main Entrance and check in at the main information desk  You need to re-schedule your appointment should you arrive 10 or more minutes late.  We strive to give you quality time with our providers, and arriving late affects you and other patients whose appointments are after yours.  Also, if you no show three or more times for appointments you may be dismissed from the clinic at the providers discretion.     Again, thank you for choosing [facility].  Our hope is that these requests will decrease the amount of time that you wait before being seen by our physicians.       _____________________________________________________________  Should you have questions after your visit to [facility], please contact our office at [phone] between the hours of 8:00 a.m. and 4:30 p.m.  Voicemails left after 4:00 p.m. will not be returned until the following business day.  For prescription refill requests, have your pharmacy contact our office and allow 72 hours.    Cancer Center Support Programs:   > Cancer Support Group  2nd Tuesday of the month 1pm-2pm, Journey Room

## 2018-09-16 NOTE — Progress Notes (Signed)
Nutrition Follow-up:  Patient with stage IV breast cancer with bone mets and multiple myeloma s/p stem cell transplant at [facility].    Met with patient and wife following injection.  Patient reports appetite is good, "too" good.  "I really need to loose some weight."  Reports that he is eating well and drinking ensure enlive for breakfast usually to take with medications.  Reports that he really has a big sweet tooth.  Also wants to get back to using treadmill more.    Medications: reviewed  Labs: reviewed  Anthropometrics:   Weight 200 lb noted on 9/19 increased from weight of 195 lb 9.6 oz on 03/18/18 last seen by RD.    UBW in 200s  Patient reports wants to lose back down to 190s.  "I think I will feel better."  NUTRITION DIAGNOSIS: Inadequate oral intake resolved   INTERVENTION:  Discussed switching to lower calorie ensure shake.  Coupons given Discussed ways to consume well balanced diet and get to healthy weight. Encouraged exercise as long as approved by MD.     MONITORING, EVALUATION, GOAL: Patient will consume well-balanced diet for adequate nutrition   NEXT VISIT: as needed, patient to contact   Joli B. Allen, RD, LDN Registered Dietitian [phone] (pager)     

## 2018-09-26 ENCOUNTER — Other Ambulatory Visit (HOSPITAL_COMMUNITY): Payer: Self-pay | Admitting: Pharmacist

## 2018-09-26 DIAGNOSIS — Z6828 Body mass index (BMI) 28.0-28.9, adult: Secondary | ICD-10-CM | POA: Diagnosis not present

## 2018-09-26 DIAGNOSIS — L03116 Cellulitis of left lower limb: Secondary | ICD-10-CM | POA: Diagnosis not present

## 2018-09-26 NOTE — Treatment Plan (Signed)
Leave at 67 /m 2 for now until Dr Raliegh Ip has visit with patient and he can assess for infusion related complaints

## 2018-09-28 DIAGNOSIS — Z6828 Body mass index (BMI) 28.0-28.9, adult: Secondary | ICD-10-CM | POA: Diagnosis not present

## 2018-09-28 DIAGNOSIS — Z23 Encounter for immunization: Secondary | ICD-10-CM | POA: Diagnosis not present

## 2018-09-28 DIAGNOSIS — L03116 Cellulitis of left lower limb: Secondary | ICD-10-CM | POA: Diagnosis not present

## 2018-09-29 ENCOUNTER — Inpatient Hospital Stay (HOSPITAL_COMMUNITY): Payer: Medicare Other

## 2018-09-29 ENCOUNTER — Inpatient Hospital Stay (HOSPITAL_COMMUNITY): Payer: Medicare Other | Attending: Hematology

## 2018-09-29 ENCOUNTER — Other Ambulatory Visit: Payer: Self-pay

## 2018-09-29 ENCOUNTER — Encounter (HOSPITAL_COMMUNITY): Payer: Self-pay

## 2018-09-29 VITALS — BP 161/81 | HR 65 | Temp 98.1°F | Resp 18 | Wt 200.4 lb

## 2018-09-29 DIAGNOSIS — K219 Gastro-esophageal reflux disease without esophagitis: Secondary | ICD-10-CM | POA: Insufficient documentation

## 2018-09-29 DIAGNOSIS — Z79810 Long term (current) use of selective estrogen receptor modulators (SERMs): Secondary | ICD-10-CM | POA: Insufficient documentation

## 2018-09-29 DIAGNOSIS — Z170 Estrogen receptor positive status [ER+]: Secondary | ICD-10-CM | POA: Insufficient documentation

## 2018-09-29 DIAGNOSIS — C9000 Multiple myeloma not having achieved remission: Secondary | ICD-10-CM | POA: Diagnosis not present

## 2018-09-29 DIAGNOSIS — Z923 Personal history of irradiation: Secondary | ICD-10-CM | POA: Insufficient documentation

## 2018-09-29 DIAGNOSIS — C7951 Secondary malignant neoplasm of bone: Secondary | ICD-10-CM

## 2018-09-29 DIAGNOSIS — G629 Polyneuropathy, unspecified: Secondary | ICD-10-CM | POA: Insufficient documentation

## 2018-09-29 DIAGNOSIS — C50922 Malignant neoplasm of unspecified site of left male breast: Secondary | ICD-10-CM | POA: Insufficient documentation

## 2018-09-29 DIAGNOSIS — Z5111 Encounter for antineoplastic chemotherapy: Secondary | ICD-10-CM | POA: Diagnosis not present

## 2018-09-29 DIAGNOSIS — Z79899 Other long term (current) drug therapy: Secondary | ICD-10-CM | POA: Insufficient documentation

## 2018-09-29 DIAGNOSIS — I2510 Atherosclerotic heart disease of native coronary artery without angina pectoris: Secondary | ICD-10-CM | POA: Insufficient documentation

## 2018-09-29 DIAGNOSIS — I700 Atherosclerosis of aorta: Secondary | ICD-10-CM | POA: Insufficient documentation

## 2018-09-29 DIAGNOSIS — Z9221 Personal history of antineoplastic chemotherapy: Secondary | ICD-10-CM | POA: Diagnosis not present

## 2018-09-29 DIAGNOSIS — R5383 Other fatigue: Secondary | ICD-10-CM | POA: Insufficient documentation

## 2018-09-29 DIAGNOSIS — I10 Essential (primary) hypertension: Secondary | ICD-10-CM | POA: Diagnosis not present

## 2018-09-29 DIAGNOSIS — F419 Anxiety disorder, unspecified: Secondary | ICD-10-CM | POA: Diagnosis not present

## 2018-09-29 DIAGNOSIS — Z7982 Long term (current) use of aspirin: Secondary | ICD-10-CM | POA: Insufficient documentation

## 2018-09-29 LAB — CBC WITH DIFFERENTIAL/PLATELET
BASOS ABS: 0 10*3/uL (ref 0.0–0.1)
Basophils Relative: 1 %
Eosinophils Absolute: 0.4 10*3/uL (ref 0.0–0.7)
Eosinophils Relative: 7 %
HCT: 30.6 % — ABNORMAL LOW (ref 39.0–52.0)
Hemoglobin: 10 g/dL — ABNORMAL LOW (ref 13.0–17.0)
LYMPHS PCT: 22 %
Lymphs Abs: 1.3 10*3/uL (ref 0.7–4.0)
MCH: 34.4 pg — ABNORMAL HIGH (ref 26.0–34.0)
MCHC: 32.7 g/dL (ref 30.0–36.0)
MCV: 105.2 fL — ABNORMAL HIGH (ref 78.0–100.0)
MONO ABS: 0.5 10*3/uL (ref 0.1–1.0)
Monocytes Relative: 9 %
NEUTROS ABS: 3.6 10*3/uL (ref 1.7–7.7)
NEUTROS PCT: 61 %
PLATELETS: 231 10*3/uL (ref 150–400)
RBC: 2.91 MIL/uL — ABNORMAL LOW (ref 4.22–5.81)
RDW: 13.1 % (ref 11.5–15.5)
WBC: 5.8 10*3/uL (ref 4.0–10.5)

## 2018-09-29 LAB — COMPREHENSIVE METABOLIC PANEL
ALT: 12 U/L (ref 0–44)
AST: 16 U/L (ref 15–41)
Albumin: 3.7 g/dL (ref 3.5–5.0)
Alkaline Phosphatase: 28 U/L — ABNORMAL LOW (ref 38–126)
Anion gap: 6 (ref 5–15)
BUN: 15 mg/dL (ref 8–23)
CHLORIDE: 106 mmol/L (ref 98–111)
CO2: 29 mmol/L (ref 22–32)
Calcium: 9.2 mg/dL (ref 8.9–10.3)
Creatinine, Ser: 1.42 mg/dL — ABNORMAL HIGH (ref 0.61–1.24)
GFR calc non Af Amer: 49 mL/min — ABNORMAL LOW (ref >60)
GFR, EST AFRICAN AMERICAN: 56 mL/min — AB (ref >60)
Glucose, Bld: 101 mg/dL — ABNORMAL HIGH (ref 70–99)
POTASSIUM: 4.4 mmol/L (ref 3.5–5.1)
SODIUM: 141 mmol/L (ref 135–145)
Total Bilirubin: 0.4 mg/dL (ref 0.3–1.2)
Total Protein: 5.9 g/dL — ABNORMAL LOW (ref 6.5–8.1)

## 2018-09-29 MED ORDER — SODIUM CHLORIDE 0.9 % IV SOLN
Freq: Once | INTRAVENOUS | Status: AC
  Administered 2018-09-29: 14:00:00 via INTRAVENOUS

## 2018-09-29 MED ORDER — DEXTROSE 5 % IV SOLN
58.0000 mg/m2 | Freq: Once | INTRAVENOUS | Status: AC
  Administered 2018-09-29: 120 mg via INTRAVENOUS
  Filled 2018-09-29: qty 60

## 2018-09-29 MED ORDER — SODIUM CHLORIDE 0.9 % IV SOLN
Freq: Once | INTRAVENOUS | Status: AC
  Administered 2018-09-29: 16:00:00 via INTRAVENOUS

## 2018-09-29 MED ORDER — SODIUM CHLORIDE 0.9% FLUSH: 10.0000 mL | INTRAVENOUS | Status: DC | PRN

## 2018-09-29 MED ORDER — HEPARIN SOD (PORK) LOCK FLUSH 100 UNIT/ML IV SOLN
500.0000 [IU] | Freq: Once | INTRAVENOUS | Status: DC | PRN
  Filled 2018-09-29: qty 5

## 2018-09-29 MED ORDER — ACETAMINOPHEN 325 MG PO TABS
650.0000 mg | ORAL_TABLET | Freq: Once | ORAL | Status: AC
  Administered 2018-09-29: 650 mg via ORAL
  Filled 2018-09-29: qty 2

## 2018-09-29 MED ORDER — SODIUM CHLORIDE 0.9 % IV SOLN
10.0000 mg | Freq: Once | INTRAVENOUS | Status: AC
  Administered 2018-09-29: 10 mg via INTRAVENOUS
  Filled 2018-09-29: qty 1

## 2018-09-30 ENCOUNTER — Other Ambulatory Visit: Payer: Self-pay

## 2018-09-30 NOTE — Progress Notes (Unsigned)
Late entry-   Treatment given per orders. Patient tolerated it well without problems. Vitals stable and discharged home from clinic ambulatory. Follow up as scheduled.  

## 2018-09-30 NOTE — Patient Instructions (Signed)
[facility] Discharge Instructions for Patients Receiving Chemotherapy   Beginning January 23rd 2017 lab work for the Cancer Center will be done in the  Main lab at [facility] on 1st floor. If you have a lab appointment with the Cancer Center please come in thru the  Main Entrance and check in at the main information desk   Today you received the following chemotherapy agents   To help prevent nausea and vomiting after your treatment, we encourage you to take your nausea medication     If you develop nausea and vomiting, or diarrhea that is not controlled by your medication, call the clinic.  The clinic phone number is [phone]. Office hours are Monday-Friday 8:30am-5:00pm.  BELOW ARE SYMPTOMS THAT SHOULD BE REPORTED IMMEDIATELY:  *FEVER GREATER THAN 101.0 F  *CHILLS WITH OR WITHOUT FEVER  NAUSEA AND VOMITING THAT IS NOT CONTROLLED WITH YOUR NAUSEA MEDICATION  *UNUSUAL SHORTNESS OF BREATH  *UNUSUAL BRUISING OR BLEEDING  TENDERNESS IN MOUTH AND THROAT WITH OR WITHOUT PRESENCE OF ULCERS  *URINARY PROBLEMS  *BOWEL PROBLEMS  UNUSUAL RASH Items with * indicate a potential emergency and should be followed up as soon as possible. If you have an emergency after office hours please contact your primary care physician or go to the nearest emergency department.  Please call the clinic during office hours if you have any questions or concerns.   You may also contact the Patient Navigator at [phone] should you have any questions or need assistance in obtaining follow up care.      Resources For Cancer Patients and their Caregivers ? American Cancer Society: Can assist with transportation, wigs, general needs, runs Look Good Feel Better.        1-888-227-6333 ? Cancer Care: Provides financial assistance, online support groups, medication/co-pay assistance.  1-800-813-HOPE (4673) ? [facility] Assists [place] cancer  patients and their families through emotional , educational and financial support.  336-427-4357 ? [place] DSS Where to apply for food stamps, Medicaid and utility assistance. 336-342-1394 ? RCATS: Transportation to medical appointments. 336-347-2287 ? Social Security Administration: May apply for disability if have a Stage IV cancer. 336-342-7796 1-800-772-1213 ? [place] A[facility], Disability and Transit Services: Assists with nutrition, care and transit needs. 336-349-2343         

## 2018-10-05 DIAGNOSIS — C9000 Multiple myeloma not having achieved remission: Secondary | ICD-10-CM | POA: Diagnosis not present

## 2018-10-05 DIAGNOSIS — D801 Nonfamilial hypogammaglobulinemia: Secondary | ICD-10-CM | POA: Diagnosis not present

## 2018-10-05 DIAGNOSIS — Z853 Personal history of malignant neoplasm of breast: Secondary | ICD-10-CM | POA: Diagnosis not present

## 2018-10-05 DIAGNOSIS — G629 Polyneuropathy, unspecified: Secondary | ICD-10-CM | POA: Diagnosis not present

## 2018-10-05 DIAGNOSIS — Z23 Encounter for immunization: Secondary | ICD-10-CM | POA: Diagnosis not present

## 2018-10-05 DIAGNOSIS — Z9484 Stem cells transplant status: Secondary | ICD-10-CM | POA: Diagnosis not present

## 2018-10-05 DIAGNOSIS — E781 Pure hyperglyceridemia: Secondary | ICD-10-CM | POA: Diagnosis not present

## 2018-10-08 ENCOUNTER — Other Ambulatory Visit (HOSPITAL_COMMUNITY): Payer: Self-pay | Admitting: Nurse Practitioner

## 2018-10-08 DIAGNOSIS — C9000 Multiple myeloma not having achieved remission: Secondary | ICD-10-CM

## 2018-10-13 ENCOUNTER — Inpatient Hospital Stay (HOSPITAL_COMMUNITY): Payer: Medicare Other

## 2018-10-13 VITALS — BP 140/71 | HR 59 | Temp 97.7°F | Resp 18 | Wt 203.2 lb

## 2018-10-13 DIAGNOSIS — C50922 Malignant neoplasm of unspecified site of left male breast: Secondary | ICD-10-CM | POA: Diagnosis not present

## 2018-10-13 DIAGNOSIS — C9000 Multiple myeloma not having achieved remission: Secondary | ICD-10-CM

## 2018-10-13 DIAGNOSIS — Z170 Estrogen receptor positive status [ER+]: Secondary | ICD-10-CM | POA: Diagnosis not present

## 2018-10-13 DIAGNOSIS — Z79810 Long term (current) use of selective estrogen receptor modulators (SERMs): Secondary | ICD-10-CM | POA: Diagnosis not present

## 2018-10-13 DIAGNOSIS — Z5111 Encounter for antineoplastic chemotherapy: Secondary | ICD-10-CM | POA: Diagnosis not present

## 2018-10-13 DIAGNOSIS — G629 Polyneuropathy, unspecified: Secondary | ICD-10-CM | POA: Diagnosis not present

## 2018-10-13 LAB — CBC WITH DIFFERENTIAL/PLATELET
Abs Immature Granulocytes: 0.01 10*3/uL (ref 0.00–0.07)
BASOS ABS: 0.1 10*3/uL (ref 0.0–0.1)
Basophils Relative: 1 %
Eosinophils Absolute: 0.5 10*3/uL (ref 0.0–0.5)
Eosinophils Relative: 9 %
HEMATOCRIT: 31.8 % — AB (ref 39.0–52.0)
HEMOGLOBIN: 10.1 g/dL — AB (ref 13.0–17.0)
IMMATURE GRANULOCYTES: 0 %
LYMPHS ABS: 1.1 10*3/uL (ref 0.7–4.0)
LYMPHS PCT: 19 %
MCH: 34.1 pg — ABNORMAL HIGH (ref 26.0–34.0)
MCHC: 31.8 g/dL (ref 30.0–36.0)
MCV: 107.4 fL — AB (ref 80.0–100.0)
MONOS PCT: 11 %
Monocytes Absolute: 0.6 10*3/uL (ref 0.1–1.0)
NEUTROS PCT: 60 %
Neutro Abs: 3.5 10*3/uL (ref 1.7–7.7)
Platelets: 204 10*3/uL (ref 150–400)
RBC: 2.96 MIL/uL — ABNORMAL LOW (ref 4.22–5.81)
RDW: 12.9 % (ref 11.5–15.5)
WBC: 5.8 10*3/uL (ref 4.0–10.5)
nRBC: 0 % (ref 0.0–0.2)

## 2018-10-13 LAB — COMPREHENSIVE METABOLIC PANEL
ALBUMIN: 3.7 g/dL (ref 3.5–5.0)
ALK PHOS: 28 U/L — AB (ref 38–126)
ALT: 14 U/L (ref 0–44)
ANION GAP: 5 (ref 5–15)
AST: 17 U/L (ref 15–41)
BILIRUBIN TOTAL: 0.4 mg/dL (ref 0.3–1.2)
BUN: 16 mg/dL (ref 8–23)
CALCIUM: 8.8 mg/dL — AB (ref 8.9–10.3)
CO2: 25 mmol/L (ref 22–32)
Chloride: 109 mmol/L (ref 98–111)
Creatinine, Ser: 1.18 mg/dL (ref 0.61–1.24)
GFR calc Af Amer: >60 mL/min (ref >60)
GLUCOSE: 101 mg/dL — AB (ref 70–99)
Potassium: 4.3 mmol/L (ref 3.5–5.1)
Sodium: 139 mmol/L (ref 135–145)
TOTAL PROTEIN: 5.8 g/dL — AB (ref 6.5–8.1)

## 2018-10-13 LAB — LACTATE DEHYDROGENASE: LDH: 145 U/L (ref 98–192)

## 2018-10-13 MED ORDER — HEPARIN SOD (PORK) LOCK FLUSH 100 UNIT/ML IV SOLN
500.0000 [IU] | Freq: Once | INTRAVENOUS | Status: AC | PRN
  Administered 2018-10-13: 500 [IU]

## 2018-10-13 MED ORDER — SODIUM CHLORIDE 0.9% FLUSH
10.0000 mL | INTRAVENOUS | Status: DC | PRN
  Administered 2018-10-13: 10 mL
  Filled 2018-10-13: qty 10

## 2018-10-13 MED ORDER — SODIUM CHLORIDE 0.9 % IV SOLN
Freq: Once | INTRAVENOUS | Status: AC
  Administered 2018-10-13: 15:00:00 via INTRAVENOUS

## 2018-10-13 MED ORDER — ACETAMINOPHEN 325 MG PO TABS
650.0000 mg | ORAL_TABLET | Freq: Once | ORAL | Status: AC
  Administered 2018-10-13: 650 mg via ORAL

## 2018-10-13 MED ORDER — SODIUM CHLORIDE 0.9 % IV SOLN
10.0000 mg | Freq: Once | INTRAVENOUS | Status: AC
  Administered 2018-10-13: 10 mg via INTRAVENOUS
  Filled 2018-10-13: qty 1

## 2018-10-13 MED ORDER — SODIUM CHLORIDE 0.9 % IV SOLN
Freq: Once | INTRAVENOUS | Status: AC
  Administered 2018-10-13: 14:00:00 via INTRAVENOUS

## 2018-10-13 MED ORDER — DEXTROSE 5 % IV SOLN
58.0000 mg/m2 | Freq: Once | INTRAVENOUS | Status: AC
  Administered 2018-10-13: 120 mg via INTRAVENOUS
  Filled 2018-10-13: qty 60

## 2018-10-13 NOTE — Patient Instructions (Signed)
[facility] Discharge Instructions for Patients Receiving Chemotherapy   Beginning January 23rd 2017 lab work for the [facility] will be done in the  Main lab at [facility] on 1st floor. If you have a lab appointment with the [facility] please come in thru the  Main Entrance and check in at the main information desk  Today you received the following chemotherapy agents Kyprolis  To help prevent nausea and vomiting after your treatment, we encourage you to take your nausea medication  If you develop nausea and vomiting, or diarrhea that is not controlled by your medication, call the clinic.  The clinic phone number is [phone]. Office hours are Monday-Friday 8:30am-5:00pm.  BELOW ARE SYMPTOMS THAT SHOULD BE REPORTED IMMEDIATELY:  *FEVER GREATER THAN 101.0 F  *CHILLS WITH OR WITHOUT FEVER  NAUSEA AND VOMITING THAT IS NOT CONTROLLED WITH YOUR NAUSEA MEDICATION  *UNUSUAL SHORTNESS OF BREATH  *UNUSUAL BRUISING OR BLEEDING  TENDERNESS IN MOUTH AND THROAT WITH OR WITHOUT PRESENCE OF ULCERS  *URINARY PROBLEMS  *BOWEL PROBLEMS  UNUSUAL RASH Items with * indicate a potential emergency and should be followed up as soon as possible. If you have an emergency after office hours please contact your primary care physician or go to the nearest emergency department.  Please call the clinic during office hours if you have any questions or concerns.   You may also contact the Patient Navigator at [phone] should you have any questions or need assistance in obtaining follow up care.      Resources For Cancer Patients and their Caregivers ? American Cancer Society: Can assist with transportation, wigs, general needs, runs Look Good Feel Better.        ([phone] ? Cancer Care: Provides financial assistance, online support groups, medication/co-pay assistance.  1-800-813-HOPE 509-608-2066) ? [facility] Assists [place] cancer  patients and their families through emotional , educational and financial support.  830-777-7458 ? [place] DSS Where to apply for food stamps, Medicaid and utility assistance. 737-388-4071 ? [employer]: Transportation to medical appointments. ([phone] ? Social Security Administration: May apply for disability if have a Stage IV cancer. [phone] 502-346-0003 ? [employer], Disability and Transit Services: Assists with nutrition, care and transit needs. [phone]

## 2018-10-13 NOTE — Progress Notes (Signed)
1325 labs reviewed and pt approved for Kyprolis tx today.  Johnny Lucas tolerated Kyprolis and hydration without incident or complaint. VSS upon completion of treatment. Discharged in satisfactory condition in presence of wife.

## 2018-10-14 ENCOUNTER — Ambulatory Visit (HOSPITAL_COMMUNITY): Payer: Medicare Other

## 2018-10-14 ENCOUNTER — Inpatient Hospital Stay (HOSPITAL_COMMUNITY): Payer: Medicare Other

## 2018-10-14 VITALS — BP 133/73 | HR 90 | Temp 97.5°F | Resp 18

## 2018-10-14 DIAGNOSIS — C9000 Multiple myeloma not having achieved remission: Secondary | ICD-10-CM

## 2018-10-14 DIAGNOSIS — C7951 Secondary malignant neoplasm of bone: Secondary | ICD-10-CM

## 2018-10-14 DIAGNOSIS — G629 Polyneuropathy, unspecified: Secondary | ICD-10-CM | POA: Diagnosis not present

## 2018-10-14 DIAGNOSIS — C50922 Malignant neoplasm of unspecified site of left male breast: Secondary | ICD-10-CM | POA: Diagnosis not present

## 2018-10-14 DIAGNOSIS — Z5111 Encounter for antineoplastic chemotherapy: Secondary | ICD-10-CM | POA: Diagnosis not present

## 2018-10-14 DIAGNOSIS — Z79810 Long term (current) use of selective estrogen receptor modulators (SERMs): Secondary | ICD-10-CM | POA: Diagnosis not present

## 2018-10-14 DIAGNOSIS — Z170 Estrogen receptor positive status [ER+]: Secondary | ICD-10-CM | POA: Diagnosis not present

## 2018-10-14 LAB — PROTEIN ELECTROPHORESIS, SERUM
A/G Ratio: 1.9 — ABNORMAL HIGH (ref 0.7–1.7)
ALPHA-2-GLOBULIN: 0.6 g/dL (ref 0.4–1.0)
Albumin ELP: 3.5 g/dL (ref 2.9–4.4)
Alpha-1-Globulin: 0.2 g/dL (ref 0.0–0.4)
BETA GLOBULIN: 0.8 g/dL (ref 0.7–1.3)
GAMMA GLOBULIN: 0.3 g/dL — AB (ref 0.4–1.8)
Globulin, Total: 1.8 g/dL — ABNORMAL LOW (ref 2.2–3.9)
M-SPIKE, %: 0.1 g/dL — AB
Total Protein ELP: 5.3 g/dL — ABNORMAL LOW (ref 6.0–8.5)

## 2018-10-14 LAB — KAPPA/LAMBDA LIGHT CHAINS
Kappa free light chain: 10.9 mg/L (ref 3.3–19.4)
Kappa, lambda light chain ratio: 1.4 (ref 0.26–1.65)
Lambda free light chains: 7.8 mg/L (ref 5.7–26.3)

## 2018-10-14 LAB — IGG, IGA, IGM
IGG (IMMUNOGLOBIN G), SERUM: 339 mg/dL — AB (ref 700–1600)
IgA: 7 mg/dL — ABNORMAL LOW (ref 61–437)
IgM (Immunoglobulin M), Srm: 29 mg/dL (ref 20–172)

## 2018-10-14 MED ORDER — DENOSUMAB 120 MG/1.7ML ~~LOC~~ SOLN
120.0000 mg | Freq: Once | SUBCUTANEOUS | Status: AC
  Administered 2018-10-14: 120 mg via SUBCUTANEOUS
  Filled 2018-10-14: qty 1.7

## 2018-10-14 NOTE — Progress Notes (Signed)
Johnny Lucas presents today for injection per the provider's orders.  Xgeva administration without incident; see MAR for injection details.  Patient tolerated procedure well and without incident.  No questions or complaints noted at this time.  Discharged ambulatory in c/o spouse.

## 2018-10-15 LAB — IMMUNOFIXATION ELECTROPHORESIS
IGG (IMMUNOGLOBIN G), SERUM: 341 mg/dL — AB (ref 700–1600)
IgA: 8 mg/dL — ABNORMAL LOW (ref 61–437)
IgM (Immunoglobulin M), Srm: 30 mg/dL (ref 20–172)
Total Protein ELP: 5.4 g/dL — ABNORMAL LOW (ref 6.0–8.5)

## 2018-10-17 ENCOUNTER — Ambulatory Visit (HOSPITAL_COMMUNITY): Payer: Medicare Other

## 2018-10-17 ENCOUNTER — Other Ambulatory Visit (HOSPITAL_COMMUNITY): Payer: Self-pay | Admitting: *Deleted

## 2018-10-17 DIAGNOSIS — H25043 Posterior subcapsular polar age-related cataract, bilateral: Secondary | ICD-10-CM | POA: Diagnosis not present

## 2018-10-17 DIAGNOSIS — H43813 Vitreous degeneration, bilateral: Secondary | ICD-10-CM | POA: Diagnosis not present

## 2018-10-17 DIAGNOSIS — H35713 Central serous chorioretinopathy, bilateral: Secondary | ICD-10-CM | POA: Diagnosis not present

## 2018-10-17 MED ORDER — GABAPENTIN 300 MG PO CAPS: 300.0000 mg | ORAL_CAPSULE | Freq: Two times a day (BID) | ORAL | 2 refills | Status: DC

## 2018-10-17 NOTE — Telephone Encounter (Signed)
Chart reviewed, gabapentin refilled.

## 2018-10-27 ENCOUNTER — Inpatient Hospital Stay (HOSPITAL_BASED_OUTPATIENT_CLINIC_OR_DEPARTMENT_OTHER): Payer: Medicare Other | Admitting: Hematology

## 2018-10-27 ENCOUNTER — Encounter (HOSPITAL_COMMUNITY): Payer: Self-pay | Admitting: Hematology

## 2018-10-27 ENCOUNTER — Other Ambulatory Visit: Payer: Self-pay

## 2018-10-27 ENCOUNTER — Inpatient Hospital Stay (HOSPITAL_COMMUNITY): Payer: Medicare Other

## 2018-10-27 VITALS — BP 142/67 | HR 70 | Temp 98.6°F | Resp 18 | Wt 207.0 lb

## 2018-10-27 VITALS — BP 147/77 | HR 62 | Temp 98.3°F | Resp 18

## 2018-10-27 DIAGNOSIS — Z79810 Long term (current) use of selective estrogen receptor modulators (SERMs): Secondary | ICD-10-CM

## 2018-10-27 DIAGNOSIS — F419 Anxiety disorder, unspecified: Secondary | ICD-10-CM

## 2018-10-27 DIAGNOSIS — Z170 Estrogen receptor positive status [ER+]: Secondary | ICD-10-CM

## 2018-10-27 DIAGNOSIS — I10 Essential (primary) hypertension: Secondary | ICD-10-CM

## 2018-10-27 DIAGNOSIS — I700 Atherosclerosis of aorta: Secondary | ICD-10-CM

## 2018-10-27 DIAGNOSIS — C50922 Malignant neoplasm of unspecified site of left male breast: Secondary | ICD-10-CM

## 2018-10-27 DIAGNOSIS — Z9221 Personal history of antineoplastic chemotherapy: Secondary | ICD-10-CM

## 2018-10-27 DIAGNOSIS — Z923 Personal history of irradiation: Secondary | ICD-10-CM

## 2018-10-27 DIAGNOSIS — Z5111 Encounter for antineoplastic chemotherapy: Secondary | ICD-10-CM | POA: Diagnosis not present

## 2018-10-27 DIAGNOSIS — R5383 Other fatigue: Secondary | ICD-10-CM | POA: Diagnosis not present

## 2018-10-27 DIAGNOSIS — C9000 Multiple myeloma not having achieved remission: Secondary | ICD-10-CM | POA: Diagnosis not present

## 2018-10-27 DIAGNOSIS — I2510 Atherosclerotic heart disease of native coronary artery without angina pectoris: Secondary | ICD-10-CM

## 2018-10-27 DIAGNOSIS — Z79899 Other long term (current) drug therapy: Secondary | ICD-10-CM

## 2018-10-27 DIAGNOSIS — G629 Polyneuropathy, unspecified: Secondary | ICD-10-CM | POA: Diagnosis not present

## 2018-10-27 DIAGNOSIS — C7951 Secondary malignant neoplasm of bone: Secondary | ICD-10-CM

## 2018-10-27 DIAGNOSIS — Z7982 Long term (current) use of aspirin: Secondary | ICD-10-CM

## 2018-10-27 DIAGNOSIS — K219 Gastro-esophageal reflux disease without esophagitis: Secondary | ICD-10-CM

## 2018-10-27 LAB — COMPREHENSIVE METABOLIC PANEL
ALBUMIN: 3.8 g/dL (ref 3.5–5.0)
ALK PHOS: 27 U/L — AB (ref 38–126)
ALT: 13 U/L (ref 0–44)
ANION GAP: 6 (ref 5–15)
AST: 16 U/L (ref 15–41)
BUN: 14 mg/dL (ref 8–23)
CALCIUM: 8.5 mg/dL — AB (ref 8.9–10.3)
CO2: 26 mmol/L (ref 22–32)
Chloride: 108 mmol/L (ref 98–111)
Creatinine, Ser: 1.27 mg/dL — ABNORMAL HIGH (ref 0.61–1.24)
GFR calc Af Amer: >60 mL/min (ref >60)
GFR calc non Af Amer: 56 mL/min — ABNORMAL LOW (ref >60)
GLUCOSE: 108 mg/dL — AB (ref 70–99)
Potassium: 4.1 mmol/L (ref 3.5–5.1)
Sodium: 140 mmol/L (ref 135–145)
TOTAL PROTEIN: 5.9 g/dL — AB (ref 6.5–8.1)
Total Bilirubin: 0.6 mg/dL (ref 0.3–1.2)

## 2018-10-27 LAB — CBC WITH DIFFERENTIAL/PLATELET
ABS IMMATURE GRANULOCYTES: 0.01 10*3/uL (ref 0.00–0.07)
BASOS ABS: 0.1 10*3/uL (ref 0.0–0.1)
Basophils Relative: 1 %
EOS PCT: 7 %
Eosinophils Absolute: 0.4 10*3/uL (ref 0.0–0.5)
HCT: 31.6 % — ABNORMAL LOW (ref 39.0–52.0)
HEMOGLOBIN: 10.1 g/dL — AB (ref 13.0–17.0)
IMMATURE GRANULOCYTES: 0 %
LYMPHS ABS: 0.9 10*3/uL (ref 0.7–4.0)
LYMPHS PCT: 17 %
MCH: 34.9 pg — ABNORMAL HIGH (ref 26.0–34.0)
MCHC: 32 g/dL (ref 30.0–36.0)
MCV: 109.3 fL — ABNORMAL HIGH (ref 80.0–100.0)
Monocytes Absolute: 0.5 10*3/uL (ref 0.1–1.0)
Monocytes Relative: 9 %
NEUTROS ABS: 3.5 10*3/uL (ref 1.7–7.7)
NRBC: 0 % (ref 0.0–0.2)
Neutrophils Relative %: 66 %
Platelets: 194 10*3/uL (ref 150–400)
RBC: 2.89 MIL/uL — ABNORMAL LOW (ref 4.22–5.81)
RDW: 13.2 % (ref 11.5–15.5)
WBC: 5.4 10*3/uL (ref 4.0–10.5)

## 2018-10-27 MED ORDER — SODIUM CHLORIDE 0.9% FLUSH
10.0000 mL | INTRAVENOUS | Status: DC | PRN
  Administered 2018-10-27: 10 mL
  Filled 2018-10-27: qty 10

## 2018-10-27 MED ORDER — SODIUM CHLORIDE 0.9 % IV SOLN
Freq: Once | INTRAVENOUS | Status: AC
  Administered 2018-10-27: 13:00:00 via INTRAVENOUS

## 2018-10-27 MED ORDER — ACETAMINOPHEN 325 MG PO TABS
650.0000 mg | ORAL_TABLET | Freq: Once | ORAL | Status: AC
  Administered 2018-10-27: 650 mg via ORAL

## 2018-10-27 MED ORDER — DEXTROSE 5 % IV SOLN
68.0000 mg/m2 | Freq: Once | INTRAVENOUS | Status: AC
  Administered 2018-10-27: 140 mg via INTRAVENOUS
  Filled 2018-10-27: qty 60

## 2018-10-27 MED ORDER — SODIUM CHLORIDE 0.9 % IV SOLN
INTRAVENOUS | Status: DC
  Administered 2018-10-27: 11:00:00 via INTRAVENOUS

## 2018-10-27 MED ORDER — ACETAMINOPHEN 325 MG PO TABS
ORAL_TABLET | ORAL | Status: AC
  Filled 2018-10-27: qty 2

## 2018-10-27 MED ORDER — SODIUM CHLORIDE 0.9 % IV SOLN
Freq: Once | INTRAVENOUS | Status: AC
  Administered 2018-10-27: 11:00:00 via INTRAVENOUS

## 2018-10-27 MED ORDER — HEPARIN SOD (PORK) LOCK FLUSH 100 UNIT/ML IV SOLN
500.0000 [IU] | Freq: Once | INTRAVENOUS | Status: AC | PRN
  Administered 2018-10-27: 500 [IU]

## 2018-10-27 MED ORDER — SODIUM CHLORIDE 0.9 % IV SOLN
20.0000 mg | Freq: Once | INTRAVENOUS | Status: AC
  Administered 2018-10-27: 20 mg via INTRAVENOUS
  Filled 2018-10-27: qty 2

## 2018-10-27 NOTE — Progress Notes (Signed)
Patient seen by oncologist with lab review.  Ser Creatinine 1.27 today and ok to treat verbal order Dr. Delton Coombes.   Patient tolerated treatment with no complaints voiced.  Port site clean and dry with good blood return noted before and after administration of chemotherapy.  Band aid applied.  VSS with discharge and left ambulatory with no s/s of distress noted.

## 2018-10-27 NOTE — Patient Instructions (Signed)
[facility] Discharge Instructions for Patients Receiving Chemotherapy  Today you received the following chemotherapy agents kyprolis.    If you develop nausea and vomiting that is not controlled by your nausea medication, call the clinic.   BELOW ARE SYMPTOMS THAT SHOULD BE REPORTED IMMEDIATELY:  *FEVER GREATER THAN 100.5 F  *CHILLS WITH OR WITHOUT FEVER  NAUSEA AND VOMITING THAT IS NOT CONTROLLED WITH YOUR NAUSEA MEDICATION  *UNUSUAL SHORTNESS OF BREATH  *UNUSUAL BRUISING OR BLEEDING  TENDERNESS IN MOUTH AND THROAT WITH OR WITHOUT PRESENCE OF ULCERS  *URINARY PROBLEMS  *BOWEL PROBLEMS  UNUSUAL RASH Items with * indicate a potential emergency and should be followed up as soon as possible.  Feel free to call the clinic should you have any questions or concerns. The clinic phone number is [phone].  Please show the [facility] at check-in to the Emergency Department and triage nurse.

## 2018-10-27 NOTE — Progress Notes (Signed)
[place], [place] [address]   CLINIC:  Medical Oncology/Hematology  PCP:  [name], MD [place] [address] [phone]   REASON FOR VISIT: Follow-up for multiple myeloma S/P stem cell transplant  CURRENT THERAPY: Carfilzomib every 2 weeks  BRIEF ONCOLOGIC HISTORY:    Breast cancer, male ([place])   12/31/2009 Initial Biopsy    Biopsy of L breast     12/31/2009 Pathology Results    Invasive ductal carcinoma, ER/PR+, HER 2 negative    12/31/2009 Imaging    Ultrasound showing a 2.43 x 1.85 x 3 cm hypoechoic spiculated mass in the 12 o clock L breast retroareolar region    01/01/2010 -  Anti-estrogen oral therapy    Tamoxifen 20 mg daily    01/06/2010 Imaging    Bone scan abnormal uptake in the diaphysis of the R humerus, abnormal in the R third, fifth and sixth ribs, lesion also noted in the sternum.    02/03/2010 Surgery    Rod placement and fixation of R humerus by Dr. [name]    02/05/2010 - 02/18/2010 Radiation Therapy    30Gy in 10 fractions of 3 Gy per fraction to R pathologic fracture    03/11/2010 -  Chemotherapy    Denosumab monthly, now every 3 months. Started at [facility]     06/09/2016 Imaging    Three hypermetabolic osseous lesions in the sternum, left ilium and right ilium, as discussed above, likely represent osseous metastases. At this time, these are not recognizable on the CT images. 2. No extra skeletal metastatic disease identified in the neck, chest, abdomen or pelvis.    10/13/2016 Progression    PET shows various new and enlarging osseous metastatic lesions with no definite extra osseous metastatic disease currently identified.     12/31/2016 Progression    1. Multifocal hypermetabolic osseous metastases throughout the axial and proximal appendicular skeleton, which are increased in size, number and metabolism since 10/13/2016 PET-CT. 2. 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 3. No additional sites of hypermetabolic metastatic disease. 4. Chronic right mastoid sinusitis. 5. Aortic atherosclerosis.  One vessel coronary atherosclerosis.     Multiple myeloma ([place])   02/12/2017 Bone Marrow Biopsy    The marrow was variably cellular with large peritrabecular aggregates of kappa restricted plasma cells (66% by aspirate, 30% by Cd138). Cytogenetics +11.     03/01/2017 - 06/29/2017 Chemotherapy    RVD     05/26/2017 Bone Marrow Biopsy    Performed at [facility]:  Plasma cell myeloma in a 30% cellularmarrow with decreased trilineage hematopoiesis and 42% kappalight chain restricted plasma cells on the aspirate smears andlarge aggregates on the core biopsy.     07/12/2017 - 09/01/2017 Chemotherapy    3 cycles of carfizolmib/cyclophosphamide/dexamethasone      10/08/2017 Bone Marrow Transplant    Autotransplant at [facility]      CANCER STAGING: Cancer Staging Multiple myeloma [facility]) Staging form: Plasma Cell Myeloma and Plasma Cell Disorders, AJCC 8th Edition - Clinical stage from 05/03/2017: Beta-2-microglobulin (mg/L): 3.5, Albumin (g/dL): 3.4, ISS: Stage II, High-risk cytogenetics: Absent, LDH: Not assessed - Signed by Baird Cancer, PA-C on 05/03/2017    INTERVAL HISTORY:  Mr. Carlini 72 y.o. male returns for routine follow-up for multiple myeloma. Patient is here today with his wife. He is tolerating treatment well  since the dose increase. He does report fatigue throughout the day but adjusts with frequent rests. He has numbness and tingling in his feet which is stable at this time. He reports his appetite at 75% and drinks ensure daily to help maintain his weight. His energy level is 100%. He denies any nausea, vomiting, or diarrhea. Denies any new pains. Denies any mouth sores or skin rashes. He tries to remain active at home as much as he  can. He performs all his own ADLs.    REVIEW OF SYSTEMS:  Review of Systems  Neurological: Positive for numbness.  Hematological: Bruises/bleeds easily.  All other systems reviewed and are negative.    PAST MEDICAL/SURGICAL HISTORY:  Past Medical History:  Diagnosis Date  . Anxiety   . Bone metastases ([place]) 09/10/2016  . Breast cancer ([place]) 2011   Stave IV breast cancer; radiation and tamoxifen  . Breast cancer, male ([place])    Stave IV breast cancer; radiation and tamoxifen  Overview:  Left breast ca with mets bone  Overview:  METS TO BONE  . GERD (gastroesophageal reflux disease)   . Hypertension   . Macular degeneration   . Multiple myeloma ([place]) 02/18/2017   Past Surgical History:  Procedure Laterality Date  . BACK SURGERY    . HERNIA REPAIR    . PORTACATH PLACEMENT Left 06/10/2018   Procedure: INSERTION PORT-A-CATH;  Surgeon: Virl Cagey, MD;  Location: [facility];  Service: General;  Laterality: Left;  . right arm surgery       SOCIAL HISTORY:  Social History   Socioeconomic History  . Marital status: Married    Spouse name: Not on file  . Number of children: 3  . Years of education: Not on file  . Highest education level: Not on file  Occupational History  . Not on file  Social Needs  . Financial resource strain: Not on file  . Food insecurity:    Worry: Not on file    Inability: Not on file  . Transportation needs:    Medical: Not on file    Non-medical: Not on file  Tobacco Use  . Smoking status: Never Smoker  . Smokeless tobacco: Former Network engineer and Sexual Activity  . Alcohol use: Yes    Alcohol/week: 24.0 standard drinks    Types: 24 Cans of beer per week  . Drug use: No  . Sexual activity: Not on file  Lifestyle  . Physical activity:    Days per week: Not on file    Minutes per session: Not on file  . Stress: Not on file  Relationships  . Social connections:    Talks on phone: Not on file    Gets together: Not on file    Attends  religious service: Not on file    Active member of club or organization: Not on file    Attends meetings of clubs or organizations: Not on file    Relationship status: Not on file  . Intimate partner violence:    Fear of current or ex partner: Not on file    Emotionally abused: Not on file    Physically abused: Not on file    Forced sexual activity: Not on file  Other Topics Concern  . Not on file  Social History Narrative  . Not on file    FAMILY HISTORY:  Family History  Problem Relation Age of Onset  . Stroke Mother   . Cancer Maternal Aunt  cancer NOS; died in her 63s  . Lung cancer Maternal Uncle        smoker    CURRENT MEDICATIONS:  Outpatient Encounter Medications as of 10/27/2018  Medication Sig Note  . acyclovir (ZOVIRAX) 800 MG tablet Take 800 mg by mouth 2 (two) times daily.   Marland Kitchen albuterol (PROVENTIL HFA;VENTOLIN HFA) 108 (90 Base) MCG/ACT inhaler Inhale 2 puffs into the lungs every 6 (six) hours as needed for wheezing or shortness of breath.   . ALPRAZolam (XANAX) 0.5 MG tablet TAKE 1 TABLET BY MOUTH AT BEDTIME AS NEEDED FOR ANXIETY   . aspirin EC 81 MG tablet Take 162 mg by mouth daily.  09/10/2016: Received from: [facility]: Take 162 mg by mouth daily.  . calcium-vitamin D (OSCAL WITH D) 500-200 MG-UNIT TABS tablet Take 2 tablets by mouth daily.   . Carfilzomib (KYPROLIS IV) Inject into the vein. Day 1 and 2, day 8 and 9, day 15 and 16 every 28 days for 2 months   . cephALEXin (KEFLEX) 500 MG capsule Take 500 mg by mouth 3 (three) times daily.   Marland Kitchen denosumab (XGEVA) 120 MG/1.7ML SOLN injection Inject 120 mg into the skin once.   . docusate sodium (COLACE) 100 MG capsule Take 1 capsule (100 mg total) by mouth 2 (two) times daily as needed.   . doxycycline (VIBRA-TABS) 100 MG tablet Take 100 mg by mouth 2 (two) times daily.   . folic acid (FOLVITE) 1 MG tablet Take 1 mg by mouth daily.    Marland Kitchen gabapentin (NEURONTIN) 300 MG capsule Take 1 capsule  (300 mg total) by mouth 2 (two) times daily. Take two times a day, then increase to three times a day in a few weeks.   Marland Kitchen guaiFENesin (MUCINEX) 600 MG 12 hr tablet Take 600 mg by mouth 2 (two) times daily as needed for cough.   . loratadine (CLARITIN) 10 MG tablet Take 1 tablet (10 mg total) by mouth daily.   . metoprolol succinate (TOPROL-XL) 50 MG 24 hr tablet Take 50 mg by mouth daily.    . Multiple Vitamin (MULTIVITAMIN WITH MINERALS) TABS tablet Take 1 tablet by mouth daily.   . ondansetron (ZOFRAN) 8 MG tablet Take 1 tablet (8 mg total) by mouth every 8 (eight) hours as needed for nausea or vomiting.   . pantoprazole (PROTONIX) 40 MG tablet Take 40 mg by mouth every evening.    . prochlorperazine (COMPAZINE) 10 MG tablet Take 1 tablet (10 mg total) by mouth every 6 (six) hours as needed for nausea or vomiting.   . tamoxifen (NOLVADEX) 20 MG tablet Take 1 tablet (20 mg total) by mouth daily.   . [DISCONTINUED] acyclovir (ZOVIRAX) 400 MG tablet Take 400 mg by mouth 2 (two) times daily.    Facility-Administered Encounter Medications as of 10/27/2018  Medication  . heparin lock flush 100 unit/mL  . sodium chloride flush (NS) 0.9 % injection 10 mL    ALLERGIES:  No Known Allergies   PHYSICAL EXAM:  ECOG Performance status: 1  Vitals:   10/27/18 1025  BP: (!) 142/67  Pulse: 70  Resp: 18  Temp: 98.6 F (37 C)  SpO2: 98%   Filed Weights   10/27/18 1025  Weight: 207 lb (93.9 kg)    Physical Exam  Constitutional: He is oriented to person, place, and time. He appears well-developed and well-nourished.  Cardiovascular: Normal rate, regular rhythm and normal heart sounds.  Pulmonary/Chest: Effort normal and breath sounds normal.  Musculoskeletal: Normal range of motion.  Neurological: He is alert and oriented to person, place, and time.  Skin: Skin is warm and dry.  Psychiatric: He has a normal mood and affect. His behavior is normal. Judgment and thought content normal.      LABORATORY DATA:  I have reviewed the labs as listed.  CBC    Component Value Date/Time   WBC 5.4 10/27/2018 0911   RBC 2.89 (L) 10/27/2018 0911   HGB 10.1 (L) 10/27/2018 0911   HCT 31.6 (L) 10/27/2018 0911   PLT 194 10/27/2018 0911   MCV 109.3 (H) 10/27/2018 0911   MCH 34.9 (H) 10/27/2018 0911   MCHC 32.0 10/27/2018 0911   RDW 13.2 10/27/2018 0911   LYMPHSABS 0.9 10/27/2018 0911   MONOABS 0.5 10/27/2018 0911   EOSABS 0.4 10/27/2018 0911   BASOSABS 0.1 10/27/2018 0911   CMP Latest Ref Rng & Units 10/27/2018 10/13/2018 09/29/2018  Glucose 70 - 99 mg/dL 108(H) 101(H) 101(H)  BUN 8 - 23 mg/dL '14 16 15  '$ Creatinine 0.61 - 1.24 mg/dL 1.27(H) 1.18 1.42(H)  Sodium 135 - 145 mmol/L 140 139 141  Potassium 3.5 - 5.1 mmol/L 4.1 4.3 4.4  Chloride 98 - 111 mmol/L 108 109 106  CO2 22 - 32 mmol/L '26 25 29  '$ Calcium 8.9 - 10.3 mg/dL 8.5(L) 8.8(L) 9.2  Total Protein 6.5 - 8.1 g/dL 5.9(L) 5.8(L) 5.9(L)  Total Bilirubin 0.3 - 1.2 mg/dL 0.6 0.4 0.4  Alkaline Phos 38 - 126 U/L 27(L) 28(L) 28(L)  AST 15 - 41 U/L '16 17 16  '$ ALT 0 - 44 U/L '13 14 12         '$ ASSESSMENT & PLAN:   Multiple myeloma ([name] 1.  Stage II IgG kappa multiple myeloma (Dx February 2018): -RVD from 03/01/2017 through 06/29/2017 -Chemotherapy changed to 3 cycles of CCyD from 07/12/2017-04/2017 to obtain deeper response - Status post auto stem cell transplant on 10/08/2017 -Stable M spike after transplant, started on CCyD, cycle 1 on 02/14/2018, cycle 2 on 03/14/2018 -His M spike after 2 cycles was undetectable, when measured at [facility] last week.  He was recommended to have maintenance with carfilzomib 70 mg/m square on days 1 and 15 every 28 days by Dr. Norma Fredrickson. -He received his first carfilzomib '70mg'$ /m2 on 04/20/2018, developed fever with chills 3 hours later with weakness in the extremities.  He was admitted to the hospital, received 1 unit of blood transfusion.  Blood cultures turned out to be negative.  He was  febrile after hospitalization and was sent home. - Subsequently he tolerated carfilzomib and smaller doses very well.  We have increased his doses to 60 mg/m on 09/01/2018 and he tolerated 4 doses very well with the addition of dexamethasone. - He was recently evaluated by Dr. Norma Fredrickson.  On 10 9 M spike at [facility] was too small to quantitate.  On 10/13/2018 serum immunofixation was positive for IgG kappa protein.  Free light chain ratio was 1.4.  M spike was 0.1. - We will try to increase his dose to 70 mg/m today.  Should he have any problems, we will divide the dose over 2 days at next treatment. -She will be reevaluated in 2 months with repeat myeloma blood work.  2.  Metastatic left breast cancer to the bones (Dx 2011): He is status post lumpectomy and was started on tamoxifen.  Tamoxifen was started back to 30 days after his bone marrow transplant.   3.  Peripheral neuropathy:  His neuropathy has been stable.  He is continuing gabapentin 300 mg twice daily.  4.  Infection prophylaxis: He will continue acyclovir 400 mg twice daily.  5.  Hypertension: He is continuing metoprolol XL 50 mg daily.      Orders placed this encounter:  Orders Placed This Encounter  Procedures  . Protein electrophoresis, serum  . Kappa/lambda light chains  . Lactate dehydrogenase  . CBC with Differential/Platelet  . Comprehensive metabolic panel      Derek Jack, MD [place] [phone]

## 2018-10-27 NOTE — Patient Instructions (Signed)
[facility] Discharge Instructions     Thank you for choosing [facility] at [facility] to provide your oncology and hematology care.  To afford each patient quality time with our provider, please arrive at least 15 minutes before your scheduled appointment time.   If you have a lab appointment with the Cancer Center please come in thru the  Main Entrance and check in at the main information desk  You need to re-schedule your appointment should you arrive 10 or more minutes late.  We strive to give you quality time with our providers, and arriving late affects you and other patients whose appointments are after yours.  Also, if you no show three or more times for appointments you may be dismissed from the clinic at the providers discretion.     Again, thank you for choosing [facility].  Our hope is that these requests will decrease the amount of time that you wait before being seen by our physicians.       _____________________________________________________________  Should you have questions after your visit to [facility], please contact our office at [phone] between the hours of 8:00 a.m. and 4:30 p.m.  Voicemails left after 4:00 p.m. will not be returned until the following business day.  For prescription refill requests, have your pharmacy contact our office and allow 72 hours.    Cancer Center Support Programs:   > Cancer Support Group  2nd Tuesday of the month 1pm-2pm, Journey Room    

## 2018-10-27 NOTE — Assessment & Plan Note (Signed)
1.  Stage II IgG kappa multiple myeloma (Dx February 2018): -RVD from 03/01/2017 through 06/29/2017 -Chemotherapy changed to 3 cycles of CCyD from 07/12/2017-04/2017 to obtain deeper response - Status post auto stem cell transplant on 10/08/2017 -Stable M spike after transplant, started on CCyD, cycle 1 on 02/14/2018, cycle 2 on 03/14/2018 -His M spike after 2 cycles was undetectable, when measured at [facility] last week.  He was recommended to have maintenance with carfilzomib 70 mg/m square on days 1 and 15 every 28 days by Dr. Norma Fredrickson. -He received his first carfilzomib '70mg'$ /m2 on 04/20/2018, developed fever with chills 3 hours later with weakness in the extremities.  He was admitted to the hospital, received 1 unit of blood transfusion.  Blood cultures turned out to be negative.  He was febrile after hospitalization and was sent home. - Subsequently he tolerated carfilzomib and smaller doses very well.  We have increased his doses to 60 mg/m on 09/01/2018 and he tolerated 4 doses very well with the addition of dexamethasone. - He was recently evaluated by Dr. Norma Fredrickson.  On 10 9 M spike at [facility] was too small to quantitate.  On 10/13/2018 serum immunofixation was positive for IgG kappa protein.  Free light chain ratio was 1.4.  M spike was 0.1. - We will try to increase his dose to 70 mg/m today.  Should he have any problems, we will divide the dose over 2 days at next treatment. -She will be reevaluated in 2 months with repeat myeloma blood work.  2.  Metastatic left breast cancer to the bones (Dx 2011): He is status post lumpectomy and was started on tamoxifen.  Tamoxifen was started back to 30 days after his bone marrow transplant.   3.  Peripheral neuropathy: His neuropathy has been stable.  He is continuing gabapentin 300 mg twice daily.  4.  Infection prophylaxis: He will continue acyclovir 400 mg twice daily.  5.  Hypertension: He is continuing metoprolol XL 50 mg daily.

## 2018-11-10 ENCOUNTER — Inpatient Hospital Stay (HOSPITAL_COMMUNITY): Payer: Medicare Other

## 2018-11-10 ENCOUNTER — Inpatient Hospital Stay (HOSPITAL_COMMUNITY): Payer: Medicare Other | Attending: Hematology

## 2018-11-10 ENCOUNTER — Ambulatory Visit (HOSPITAL_COMMUNITY): Payer: Medicare Other

## 2018-11-10 ENCOUNTER — Other Ambulatory Visit (HOSPITAL_COMMUNITY): Payer: Medicare Other

## 2018-11-10 VITALS — BP 146/68 | HR 62 | Temp 97.3°F | Resp 18 | Wt 205.0 lb

## 2018-11-10 DIAGNOSIS — Z5111 Encounter for antineoplastic chemotherapy: Secondary | ICD-10-CM | POA: Insufficient documentation

## 2018-11-10 DIAGNOSIS — C7951 Secondary malignant neoplasm of bone: Secondary | ICD-10-CM

## 2018-11-10 DIAGNOSIS — C9000 Multiple myeloma not having achieved remission: Secondary | ICD-10-CM | POA: Insufficient documentation

## 2018-11-10 DIAGNOSIS — Z79899 Other long term (current) drug therapy: Secondary | ICD-10-CM | POA: Diagnosis not present

## 2018-11-10 LAB — COMPREHENSIVE METABOLIC PANEL
ALK PHOS: 28 U/L — AB (ref 38–126)
ALT: 13 U/L (ref 0–44)
AST: 16 U/L (ref 15–41)
Albumin: 4 g/dL (ref 3.5–5.0)
Anion gap: 4 — ABNORMAL LOW (ref 5–15)
BUN: 18 mg/dL (ref 8–23)
CALCIUM: 8.9 mg/dL (ref 8.9–10.3)
CO2: 27 mmol/L (ref 22–32)
Chloride: 107 mmol/L (ref 98–111)
Creatinine, Ser: 1.3 mg/dL — ABNORMAL HIGH (ref 0.61–1.24)
GFR calc Af Amer: >60 mL/min (ref >60)
GFR calc non Af Amer: 54 mL/min — ABNORMAL LOW (ref >60)
Glucose, Bld: 94 mg/dL (ref 70–99)
Potassium: 4 mmol/L (ref 3.5–5.1)
SODIUM: 138 mmol/L (ref 135–145)
TOTAL PROTEIN: 6.2 g/dL — AB (ref 6.5–8.1)
Total Bilirubin: 0.6 mg/dL (ref 0.3–1.2)

## 2018-11-10 LAB — CBC WITH DIFFERENTIAL/PLATELET
Abs Immature Granulocytes: 0.01 10*3/uL (ref 0.00–0.07)
BASOS PCT: 1 %
Basophils Absolute: 0.1 10*3/uL (ref 0.0–0.1)
Eosinophils Absolute: 0.3 10*3/uL (ref 0.0–0.5)
Eosinophils Relative: 6 %
HCT: 33.7 % — ABNORMAL LOW (ref 39.0–52.0)
HEMOGLOBIN: 10.7 g/dL — AB (ref 13.0–17.0)
Immature Granulocytes: 0 %
LYMPHS PCT: 20 %
Lymphs Abs: 1.1 10*3/uL (ref 0.7–4.0)
MCH: 34.6 pg — ABNORMAL HIGH (ref 26.0–34.0)
MCHC: 31.8 g/dL (ref 30.0–36.0)
MCV: 109.1 fL — ABNORMAL HIGH (ref 80.0–100.0)
MONO ABS: 0.6 10*3/uL (ref 0.1–1.0)
Monocytes Relative: 11 %
Neutro Abs: 3.4 10*3/uL (ref 1.7–7.7)
Neutrophils Relative %: 62 %
Platelets: 206 10*3/uL (ref 150–400)
RBC: 3.09 MIL/uL — AB (ref 4.22–5.81)
RDW: 13.4 % (ref 11.5–15.5)
WBC: 5.5 10*3/uL (ref 4.0–10.5)
nRBC: 0 % (ref 0.0–0.2)

## 2018-11-10 MED ORDER — ACETAMINOPHEN 325 MG PO TABS
ORAL_TABLET | ORAL | Status: AC
  Filled 2018-11-10: qty 2

## 2018-11-10 MED ORDER — SODIUM CHLORIDE 0.9 % IV SOLN
20.0000 mg | Freq: Once | INTRAVENOUS | Status: AC
  Administered 2018-11-10: 20 mg via INTRAVENOUS
  Filled 2018-11-10: qty 2

## 2018-11-10 MED ORDER — ACYCLOVIR 800 MG PO TABS: 800.0000 mg | ORAL_TABLET | Freq: Two times a day (BID) | ORAL | 3 refills | Status: DC

## 2018-11-10 MED ORDER — HEPARIN SOD (PORK) LOCK FLUSH 100 UNIT/ML IV SOLN
500.0000 [IU] | Freq: Once | INTRAVENOUS | Status: AC | PRN
  Administered 2018-11-10: 500 [IU]

## 2018-11-10 MED ORDER — SODIUM CHLORIDE 0.9 % IV SOLN
Freq: Once | INTRAVENOUS | Status: AC
  Administered 2018-11-10: 13:00:00 via INTRAVENOUS

## 2018-11-10 MED ORDER — GABAPENTIN 300 MG PO CAPS: 300.0000 mg | ORAL_CAPSULE | Freq: Two times a day (BID) | ORAL | 3 refills | Status: DC

## 2018-11-10 MED ORDER — DEXTROSE 5 % IV SOLN
68.0000 mg/m2 | Freq: Once | INTRAVENOUS | Status: AC
  Administered 2018-11-10: 140 mg via INTRAVENOUS
  Filled 2018-11-10: qty 60

## 2018-11-10 MED ORDER — SODIUM CHLORIDE 0.9% FLUSH
10.0000 mL | INTRAVENOUS | Status: DC | PRN
  Administered 2018-11-10: 10 mL
  Filled 2018-11-10: qty 10

## 2018-11-10 MED ORDER — SODIUM CHLORIDE 0.9 % IV SOLN
Freq: Once | INTRAVENOUS | Status: AC
  Administered 2018-11-10: 11:00:00 via INTRAVENOUS

## 2018-11-10 MED ORDER — ACETAMINOPHEN 325 MG PO TABS
650.0000 mg | ORAL_TABLET | Freq: Once | ORAL | Status: AC
  Administered 2018-11-10: 650 mg via ORAL

## 2018-11-10 NOTE — Patient Instructions (Signed)
[facility] Discharge Instructions for Patients Receiving Chemotherapy   Beginning January 23rd 2017 lab work for the [facility] will be done in the  Main lab at [facility] on 1st floor. If you have a lab appointment with the [facility] please come in thru the  Main Entrance and check in at the main information desk   Today you received the following chemotherapy agents Kyprolis  To help prevent nausea and vomiting after your treatment, we encourage you to take your nausea medication   If you develop nausea and vomiting, or diarrhea that is not controlled by your medication, call the clinic.  The clinic phone number is [phone]. Office hours are Monday-Friday 8:30am-5:00pm.  BELOW ARE SYMPTOMS THAT SHOULD BE REPORTED IMMEDIATELY:  *FEVER GREATER THAN 101.0 F  *CHILLS WITH OR WITHOUT FEVER  NAUSEA AND VOMITING THAT IS NOT CONTROLLED WITH YOUR NAUSEA MEDICATION  *UNUSUAL SHORTNESS OF BREATH  *UNUSUAL BRUISING OR BLEEDING  TENDERNESS IN MOUTH AND THROAT WITH OR WITHOUT PRESENCE OF ULCERS  *URINARY PROBLEMS  *BOWEL PROBLEMS  UNUSUAL RASH Items with * indicate a potential emergency and should be followed up as soon as possible. If you have an emergency after office hours please contact your primary care physician or go to the nearest emergency department.  Please call the clinic during office hours if you have any questions or concerns.   You may also contact the Patient Navigator at [phone] should you have any questions or need assistance in obtaining follow up care.      Resources For Cancer Patients and their Caregivers ? American Cancer Society: Can assist with transportation, wigs, general needs, runs Look Good Feel Better.        956-396-1472 ? Cancer Care: Provides financial assistance, online support groups, medication/co-pay assistance.  1-800-813-HOPE 410-420-9788) ? [facility] Assists [place] cancer  patients and their families through emotional , educational and financial support.  [phone] ? [place] DSS Where to apply for food stamps, Medicaid and utility assistance. (506)483-4014 ? [employer]: Transportation to medical appointments. ([phone]) [phone] ? Social Security Administration: May apply for disability if have a Stage IV cancer. [phone] ? [employer], Disability and Transit Services: Assists with nutrition, care and transit needs. [phone]

## 2018-11-10 NOTE — Progress Notes (Signed)
Johnny Lucas tolerated Kyprolis infusion without incident or complaint. VSS. Pt discharged self ambulatory in satisfactory condition in presence of wife.

## 2018-11-11 ENCOUNTER — Encounter (HOSPITAL_COMMUNITY): Payer: Self-pay

## 2018-11-11 ENCOUNTER — Inpatient Hospital Stay (HOSPITAL_COMMUNITY): Payer: Medicare Other

## 2018-11-11 VITALS — BP 118/68 | HR 70 | Temp 98.5°F | Resp 18

## 2018-11-11 DIAGNOSIS — Z79899 Other long term (current) drug therapy: Secondary | ICD-10-CM | POA: Diagnosis not present

## 2018-11-11 DIAGNOSIS — C9000 Multiple myeloma not having achieved remission: Secondary | ICD-10-CM

## 2018-11-11 DIAGNOSIS — C7951 Secondary malignant neoplasm of bone: Secondary | ICD-10-CM

## 2018-11-11 DIAGNOSIS — Z5111 Encounter for antineoplastic chemotherapy: Secondary | ICD-10-CM | POA: Diagnosis not present

## 2018-11-11 MED ORDER — DENOSUMAB 120 MG/1.7ML ~~LOC~~ SOLN
120.0000 mg | Freq: Once | SUBCUTANEOUS | Status: AC
  Administered 2018-11-11: 120 mg via SUBCUTANEOUS
  Filled 2018-11-11: qty 1.7

## 2018-11-11 NOTE — Patient Instructions (Signed)
[facility] at [facility]  Discharge Instructions:   _______________________________________________________________  Thank you for choosing [facility] at [facility] to provide your oncology and hematology care.  To afford each patient quality time with our providers, please arrive at least 15 minutes before your scheduled appointment.  You need to re-schedule your appointment if you arrive 10 or more minutes late.  We strive to give you quality time with our providers, and arriving late affects you and other patients whose appointments are after yours.  Also, if you no show three or more times for appointments you may be dismissed from the clinic.  Again, thank you for choosing [facility] at [facility]. Our hope is that these requests will allow you access to exceptional care and in a timely manner. _______________________________________________________________  If you have questions after your visit, please contact our office at [phone] between the hours of 8:30 a.m. and 5:00 p.m. Voicemails left after 4:30 p.m. will not be returned until the following business day. _______________________________________________________________  For prescription refill requests, have your pharmacy contact our office. _______________________________________________________________  Recommendations made by the consultant and any test results will be sent to your referring physician. _______________________________________________________________ 

## 2018-11-11 NOTE — Progress Notes (Signed)
Patient taking oyster calcium tabs three times a day.  Denied tooth, jaw, or leg pain.  No recent or upcoming dental visits.    Patient tolerated injection with no complaints voiced.  Site clean and dry with no bruising or swelling noted at site.  VSS with discharge and left ambulatory with family with no s/s of distress noted.

## 2018-11-12 ENCOUNTER — Other Ambulatory Visit (HOSPITAL_COMMUNITY): Payer: Self-pay | Admitting: Nurse Practitioner

## 2018-11-12 DIAGNOSIS — C9000 Multiple myeloma not having achieved remission: Secondary | ICD-10-CM

## 2018-11-16 ENCOUNTER — Other Ambulatory Visit (HOSPITAL_COMMUNITY): Payer: Self-pay | Admitting: *Deleted

## 2018-11-28 ENCOUNTER — Inpatient Hospital Stay (HOSPITAL_BASED_OUTPATIENT_CLINIC_OR_DEPARTMENT_OTHER): Payer: Medicare Other | Admitting: Hematology

## 2018-11-28 ENCOUNTER — Inpatient Hospital Stay (HOSPITAL_COMMUNITY): Payer: Medicare Other | Attending: Hematology

## 2018-11-28 ENCOUNTER — Other Ambulatory Visit: Payer: Self-pay

## 2018-11-28 ENCOUNTER — Inpatient Hospital Stay (HOSPITAL_COMMUNITY): Payer: Medicare Other

## 2018-11-28 ENCOUNTER — Encounter (HOSPITAL_COMMUNITY): Payer: Self-pay | Admitting: Hematology

## 2018-11-28 VITALS — BP 141/77 | HR 74 | Temp 97.8°F | Resp 18 | Wt 203.0 lb

## 2018-11-28 DIAGNOSIS — R05 Cough: Secondary | ICD-10-CM

## 2018-11-28 DIAGNOSIS — Z79810 Long term (current) use of selective estrogen receptor modulators (SERMs): Secondary | ICD-10-CM | POA: Diagnosis not present

## 2018-11-28 DIAGNOSIS — Z9221 Personal history of antineoplastic chemotherapy: Secondary | ICD-10-CM

## 2018-11-28 DIAGNOSIS — R0602 Shortness of breath: Secondary | ICD-10-CM | POA: Diagnosis not present

## 2018-11-28 DIAGNOSIS — R5383 Other fatigue: Secondary | ICD-10-CM

## 2018-11-28 DIAGNOSIS — C7951 Secondary malignant neoplasm of bone: Secondary | ICD-10-CM

## 2018-11-28 DIAGNOSIS — C50922 Malignant neoplasm of unspecified site of left male breast: Secondary | ICD-10-CM

## 2018-11-28 DIAGNOSIS — Z9223 Personal history of estrogen therapy: Secondary | ICD-10-CM

## 2018-11-28 DIAGNOSIS — C9000 Multiple myeloma not having achieved remission: Secondary | ICD-10-CM

## 2018-11-28 DIAGNOSIS — G629 Polyneuropathy, unspecified: Secondary | ICD-10-CM | POA: Diagnosis not present

## 2018-11-28 DIAGNOSIS — R7989 Other specified abnormal findings of blood chemistry: Secondary | ICD-10-CM

## 2018-11-28 DIAGNOSIS — Z79899 Other long term (current) drug therapy: Secondary | ICD-10-CM

## 2018-11-28 DIAGNOSIS — F419 Anxiety disorder, unspecified: Secondary | ICD-10-CM | POA: Diagnosis not present

## 2018-11-28 DIAGNOSIS — Z9484 Stem cells transplant status: Secondary | ICD-10-CM

## 2018-11-28 DIAGNOSIS — I10 Essential (primary) hypertension: Secondary | ICD-10-CM | POA: Diagnosis not present

## 2018-11-28 DIAGNOSIS — Z7982 Long term (current) use of aspirin: Secondary | ICD-10-CM

## 2018-11-28 DIAGNOSIS — K219 Gastro-esophageal reflux disease without esophagitis: Secondary | ICD-10-CM | POA: Diagnosis not present

## 2018-11-28 LAB — COMPREHENSIVE METABOLIC PANEL
ALT: 20 U/L (ref 0–44)
ANION GAP: 6 (ref 5–15)
AST: 19 U/L (ref 15–41)
Albumin: 4.1 g/dL (ref 3.5–5.0)
Alkaline Phosphatase: 26 U/L — ABNORMAL LOW (ref 38–126)
BILIRUBIN TOTAL: 0.6 mg/dL (ref 0.3–1.2)
BUN: 18 mg/dL (ref 8–23)
CO2: 26 mmol/L (ref 22–32)
Calcium: 9.1 mg/dL (ref 8.9–10.3)
Chloride: 106 mmol/L (ref 98–111)
Creatinine, Ser: 1.58 mg/dL — ABNORMAL HIGH (ref 0.61–1.24)
GFR calc Af Amer: 51 mL/min — ABNORMAL LOW (ref >60)
GFR, EST NON AFRICAN AMERICAN: 44 mL/min — AB (ref >60)
Glucose, Bld: 107 mg/dL — ABNORMAL HIGH (ref 70–99)
POTASSIUM: 4 mmol/L (ref 3.5–5.1)
Sodium: 138 mmol/L (ref 135–145)
TOTAL PROTEIN: 6.5 g/dL (ref 6.5–8.1)

## 2018-11-28 LAB — CBC WITH DIFFERENTIAL/PLATELET
Abs Immature Granulocytes: 0.01 10*3/uL (ref 0.00–0.07)
BASOS ABS: 0 10*3/uL (ref 0.0–0.1)
Basophils Relative: 0 %
EOS ABS: 0.1 10*3/uL (ref 0.0–0.5)
Eosinophils Relative: 4 %
HEMATOCRIT: 35.3 % — AB (ref 39.0–52.0)
Hemoglobin: 11.3 g/dL — ABNORMAL LOW (ref 13.0–17.0)
IMMATURE GRANULOCYTES: 0 %
LYMPHS ABS: 0.7 10*3/uL (ref 0.7–4.0)
Lymphocytes Relative: 24 %
MCH: 35.1 pg — ABNORMAL HIGH (ref 26.0–34.0)
MCHC: 32 g/dL (ref 30.0–36.0)
MCV: 109.6 fL — AB (ref 80.0–100.0)
MONOS PCT: 17 %
Monocytes Absolute: 0.5 10*3/uL (ref 0.1–1.0)
NEUTROS PCT: 55 %
NRBC: 0 % (ref 0.0–0.2)
Neutro Abs: 1.5 10*3/uL — ABNORMAL LOW (ref 1.7–7.7)
Platelets: 161 10*3/uL (ref 150–400)
RBC: 3.22 MIL/uL — ABNORMAL LOW (ref 4.22–5.81)
RDW: 13.5 % (ref 11.5–15.5)
WBC: 2.8 10*3/uL — ABNORMAL LOW (ref 4.0–10.5)

## 2018-11-28 MED ORDER — SODIUM CHLORIDE 0.9 % IV SOLN: Freq: Once | INTRAVENOUS | Status: DC

## 2018-11-28 MED ORDER — ACETAMINOPHEN 325 MG PO TABS
650.0000 mg | ORAL_TABLET | Freq: Once | ORAL | Status: AC
  Administered 2018-11-28: 650 mg via ORAL
  Filled 2018-11-28: qty 2

## 2018-11-28 MED ORDER — DEXTROSE 5 % IV SOLN
68.0000 mg/m2 | Freq: Once | INTRAVENOUS | Status: AC
  Administered 2018-11-28: 140 mg via INTRAVENOUS
  Filled 2018-11-28: qty 60

## 2018-11-28 MED ORDER — HEPARIN SOD (PORK) LOCK FLUSH 100 UNIT/ML IV SOLN
500.0000 [IU] | Freq: Once | INTRAVENOUS | Status: AC | PRN
  Administered 2018-11-28: 500 [IU]

## 2018-11-28 MED ORDER — SODIUM CHLORIDE 0.9% FLUSH
10.0000 mL | INTRAVENOUS | Status: DC | PRN
  Administered 2018-11-28: 10 mL
  Filled 2018-11-28: qty 10

## 2018-11-28 MED ORDER — SODIUM CHLORIDE 0.9 % IV SOLN
20.0000 mg | Freq: Once | INTRAVENOUS | Status: AC
  Administered 2018-11-28: 20 mg via INTRAVENOUS
  Filled 2018-11-28: qty 2

## 2018-11-28 MED ORDER — SODIUM CHLORIDE 0.9 % IV SOLN
Freq: Once | INTRAVENOUS | Status: AC
  Administered 2018-11-28: 10:00:00 via INTRAVENOUS

## 2018-11-28 NOTE — Assessment & Plan Note (Signed)
1.  Stage II IgG kappa multiple myeloma (Dx February 2018): -RVD from 03/01/2017 through 06/29/2017 -Chemotherapy changed to 3 cycles of CCyD from 07/12/2017-04/2017 to obtain deeper response - Status post auto stem cell transplant on 10/08/2017 -Stable M spike after transplant, started on CCyD, cycle 1 on 02/14/2018, cycle 2 on 03/14/2018 -His M spike after 2 cycles was undetectable, when measured at [facility] last week.  He was recommended to have maintenance with carfilzomib 70 mg/m square on days 1 and 15 every 28 days by Dr. Norma Fredrickson. -He received his first carfilzomib 43m/m2 on 04/20/2018, developed fever with chills 3 hours later with weakness in the extremities.  He was admitted to the hospital, received 1 unit of blood transfusion.  Blood cultures turned out to be negative.  He was febrile after hospitalization and was sent home. - Subsequently he tolerated carfilzomib and smaller doses very well.  We have increased his doses to 60 mg/m on 09/01/2018 and he tolerated 4 doses very well with the addition of dexamethasone. - We have increased his carfilzomib to 70 mg/m on 10/27/2018.  He tolerated it very well. - We discussed his latest myeloma blood work.  SPEP was 0.1 g/dL.  Free light chain ratio is normal at 1.4.  Immunofixation showed IgG kappa. -There is mild elevation of creatinine to 1.58 which is likely from carfilzomib.  He is receiving hydration pre-and post carfilzomib. - We will closely monitor it.  He may proceed with current treatment.  We will see him back in 4 weeks for follow-up.  2.  Metastatic left breast cancer to the bones (Dx 2011): -he is status post lumpectomy and was started on tamoxifen. -he will continue tamoxifen.  3.  Peripheral neuropathy: His neuropathy has been stable.  He is continuing gabapentin 300 mg twice daily.  4.  Infection prophylaxis: He will continue acyclovir 400 mg twice daily.  5.  Hypertension: He is continuing metoprolol XL 50 mg daily.

## 2018-11-28 NOTE — Progress Notes (Signed)
Pt here today for Kyprolis. VSS. Pt complains of cough with some chest tightness and he's taking over the counter Mucinex. No fevers. Pt's L eye is red with sclera red. Pt states, he was getting out Christmas decorations and got something in his eye.   Treatment given today per MD orders. Tolerated infusion without adverse affects. Vital signs stable. No complaints at this time. Discharged from clinic ambulatory. F/U with [facility] as scheduled.

## 2018-11-28 NOTE — Progress Notes (Signed)
[address] [place], [place] [address]   CLINIC:  Medical Oncology/Hematology  PCP:  [name], MD [place] [address] [phone]   REASON FOR VISIT: Follow-up for multiple myeloma S/P stem cell transplant  CURRENT THERAPY: Carfilzomib every 2 weeks   BRIEF ONCOLOGIC HISTORY:    Breast cancer, male ([place])   12/31/2009 Initial Biopsy    Biopsy of L breast     12/31/2009 Pathology Results    Invasive ductal carcinoma, ER/PR+, HER 2 negative    12/31/2009 Imaging    Ultrasound showing a 2.43 x 1.85 x 3 cm hypoechoic spiculated mass in the 12 o clock L breast retroareolar region    01/01/2010 -  Anti-estrogen oral therapy    Tamoxifen 20 mg daily    01/06/2010 Imaging    Bone scan abnormal uptake in the diaphysis of the R humerus, abnormal in the R third, fifth and sixth ribs, lesion also noted in the sternum.    02/03/2010 Surgery    Rod placement and fixation of R humerus by Dr. [name]    02/05/2010 - 02/18/2010 Radiation Therapy    30Gy in 10 fractions of 3 Gy per fraction to R pathologic fracture    03/11/2010 -  Chemotherapy    Denosumab monthly, now every 3 months. Started at [facility]     06/09/2016 Imaging    Three hypermetabolic osseous lesions in the sternum, left ilium and right ilium, as discussed above, likely represent osseous metastases. At this time, these are not recognizable on the CT images. 2. No extra skeletal metastatic disease identified in the neck, chest, abdomen or pelvis.    10/13/2016 Progression    PET shows various new and enlarging osseous metastatic lesions with no definite extra osseous metastatic disease currently identified.     12/31/2016 Progression    1. Multifocal hypermetabolic osseous metastases throughout the axial and proximal appendicular skeleton, which are increased in size, number and metabolism since 10/13/2016 PET-CT. 2. 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 3. No additional sites of hypermetabolic metastatic disease. 4. Chronic right mastoid sinusitis. 5. Aortic atherosclerosis.  One vessel coronary atherosclerosis.     Multiple myeloma ([place])   02/12/2017 Bone Marrow Biopsy    The marrow was variably cellular with large peritrabecular aggregates of kappa restricted plasma cells (66% by aspirate, 30% by Cd138). Cytogenetics +11.     03/01/2017 - 06/29/2017 Chemotherapy    RVD     05/26/2017 Bone Marrow Biopsy    Performed at [facility]:  Plasma cell myeloma in a 30% cellularmarrow with decreased trilineage hematopoiesis and 42% kappalight chain restricted plasma cells on the aspirate smears andlarge aggregates on the core biopsy.     07/12/2017 - 09/01/2017 Chemotherapy    3 cycles of carfizolmib/cyclophosphamide/dexamethasone      10/08/2017 Bone Marrow Transplant    Autotransplant at [facility]      CANCER STAGING: Cancer Staging Multiple myeloma [facility]) Staging form: Plasma Cell Myeloma and Plasma Cell Disorders, AJCC 8th Edition - Clinical stage from 05/03/2017: Beta-2-microglobulin (mg/L): 3.5, Albumin (g/dL): 3.4, ISS: Stage II, High-risk cytogenetics: Absent, LDH: Not assessed - Signed by Baird [facility], PA-C on 05/03/2017    INTERVAL HISTORY:  Johnny Lucas 72 y.o. male returns for routine follow-up for multiple myeloma. He is here today with his wife and tolerating treatment well.  He does report having bronchitis however his cough is not bad at this time. He is only SOB with exertion. He has subconjunctival hemorrhage to his left eye most likely from coughing. He also reports it feels scratchy. He denies any nausea, vomiting, or diarrhea. Denies any fevers. Denies any new pains. He reprots his appetite at 75% and his energy level at 75%. He is having no problem maintaining his weight at this time.     REVIEW OF  SYSTEMS:  Review of Systems  Constitutional: Positive for fatigue.  HENT:   Positive for sore throat.   Respiratory: Positive for cough and shortness of breath.   Neurological: Positive for dizziness and extremity weakness.  All other systems reviewed and are negative.    PAST MEDICAL/SURGICAL HISTORY:  Past Medical History:  Diagnosis Date  . Anxiety   . Bone metastases ([place]) 09/10/2016  . Breast cancer ([place]) 2011   Stave IV breast cancer; radiation and tamoxifen  . Breast cancer, male ([place])    Stave IV breast cancer; radiation and tamoxifen  Overview:  Left breast ca with mets bone  Overview:  METS TO BONE  . GERD (gastroesophageal reflux disease)   . Hypertension   . Macular degeneration   . Multiple myeloma ([place]) 02/18/2017   Past Surgical History:  Procedure Laterality Date  . BACK SURGERY    . HERNIA REPAIR    . PORTACATH PLACEMENT Left 06/10/2018   Procedure: INSERTION PORT-A-CATH;  Surgeon: Virl Cagey, MD;  Location: [facility];  Service: General;  Laterality: Left;  . right arm surgery       SOCIAL HISTORY:  Social History   Socioeconomic History  . Marital status: Married    Spouse name: Not on file  . Number of children: 3  . Years of education: Not on file  . Highest education level: Not on file  Occupational History  . Not on file  Social Needs  . Financial resource strain: Not on file  . Food insecurity:    Worry: Not on file    Inability: Not on file  . Transportation needs:    Medical: Not on file    Non-medical: Not on file  Tobacco Use  . Smoking status: Never Smoker  . Smokeless tobacco: Former Network engineer and Sexual Activity  . Alcohol use: Yes    Alcohol/week: 24.0 standard drinks    Types: 24 Cans of beer per week  . Drug use: No  . Sexual activity: Not on file  Lifestyle  . Physical activity:    Days per week: Not on file    Minutes per session: Not on file  . Stress: Not on file  Relationships  . Social connections:     Talks on phone: Not on file    Gets together: Not on file    Attends religious service: Not on file    Active member of club or organization: Not on file    Attends meetings of clubs or organizations: Not on file    Relationship status: Not on file  . Intimate partner violence:    Fear of current or ex partner: Not on file    Emotionally abused: Not on file    Physically abused: Not on file    Forced sexual activity: Not on file  Other Topics Concern  . Not on file  Social History Narrative  . Not on file    FAMILY HISTORY:  Family History  Problem Relation Age of Onset  .  Stroke Mother   . Cancer Maternal Aunt        cancer NOS; died in her 64s  . Lung cancer Maternal Uncle        smoker    CURRENT MEDICATIONS:  Outpatient Encounter Medications as of 11/28/2018  Medication Sig Note  . acyclovir (ZOVIRAX) 800 MG tablet Take 1 tablet (800 mg total) by mouth 2 (two) times daily.   Marland Kitchen albuterol (PROVENTIL HFA;VENTOLIN HFA) 108 (90 Base) MCG/ACT inhaler Inhale 2 puffs into the lungs every 6 (six) hours as needed for wheezing or shortness of breath.   . ALPRAZolam (XANAX) 0.5 MG tablet TAKE 1 TABLET BY MOUTH AT BEDTIME AS NEEDED FOR ANXIETY   . aspirin EC 81 MG tablet Take 162 mg by mouth daily.  09/10/2016: Received from: [facility]: Take 162 mg by mouth daily.  . calcium-vitamin D (OSCAL WITH D) 500-200 MG-UNIT TABS tablet Take 2 tablets by mouth daily.   . Carfilzomib (KYPROLIS IV) Inject into the vein. Day 1 and 2, day 8 and 9, day 15 and 16 every 28 days for 2 months   . cephALEXin (KEFLEX) 500 MG capsule Take 500 mg by mouth 3 (three) times daily.   Marland Kitchen denosumab (XGEVA) 120 MG/1.7ML SOLN injection Inject 120 mg into the skin once.   . docusate sodium (COLACE) 100 MG capsule Take 1 capsule (100 mg total) by mouth 2 (two) times daily as needed.   . doxycycline (VIBRA-TABS) 100 MG tablet Take 100 mg by mouth 2 (two) times daily.   . folic acid (FOLVITE) 1 MG tablet  Take 1 mg by mouth daily.    Marland Kitchen gabapentin (NEURONTIN) 300 MG capsule Take 1 capsule (300 mg total) by mouth 2 (two) times daily. Take two times a day, then increase to three times a day in a few weeks.   Marland Kitchen guaiFENesin (MUCINEX) 600 MG 12 hr tablet Take 600 mg by mouth 2 (two) times daily as needed for cough.   . loratadine (CLARITIN) 10 MG tablet Take 1 tablet (10 mg total) by mouth daily.   . metoprolol succinate (TOPROL-XL) 50 MG 24 hr tablet Take 50 mg by mouth daily.    . Multiple Vitamin (MULTIVITAMIN WITH MINERALS) TABS tablet Take 1 tablet by mouth daily.   . ondansetron (ZOFRAN) 8 MG tablet Take 1 tablet (8 mg total) by mouth every 8 (eight) hours as needed for nausea or vomiting.   . pantoprazole (PROTONIX) 40 MG tablet Take 40 mg by mouth every evening.    . prochlorperazine (COMPAZINE) 10 MG tablet Take 1 tablet (10 mg total) by mouth every 6 (six) hours as needed for nausea or vomiting.   . tamoxifen (NOLVADEX) 20 MG tablet Take 1 tablet (20 mg total) by mouth daily.    Facility-Administered Encounter Medications as of 11/28/2018  Medication  . heparin lock flush 100 unit/mL  . sodium chloride flush (NS) 0.9 % injection 10 mL    ALLERGIES:  No Known Allergies   PHYSICAL EXAM:  ECOG Performance status: 1  Vitals:   11/28/18 0901  BP: (!) 141/77  Pulse: 74  Resp: 18  Temp: 97.8 F (36.6 C)  SpO2: 100%   Filed Weights   11/28/18 0901  Weight: 203 lb (92.1 kg)    Physical Exam  Constitutional: He is oriented to person, place, and time. He appears well-developed and well-nourished.  Musculoskeletal: Normal range of motion.  Neurological: He is alert and oriented to person, place, and time.  Skin: Skin is warm and dry.  Psychiatric: He has a normal mood and affect. His behavior is normal. Judgment and thought content normal.     LABORATORY DATA:  I have reviewed the labs as listed.  CBC    Component Value Date/Time   WBC 2.8 (L) 11/28/2018 0832   RBC 3.22 (L)  11/28/2018 0832   HGB 11.3 (L) 11/28/2018 0832   HCT 35.3 (L) 11/28/2018 0832   PLT 161 11/28/2018 0832   MCV 109.6 (H) 11/28/2018 0832   MCH 35.1 (H) 11/28/2018 0832   MCHC 32.0 11/28/2018 0832   RDW 13.5 11/28/2018 0832   LYMPHSABS 0.7 11/28/2018 0832   MONOABS 0.5 11/28/2018 0832   EOSABS 0.1 11/28/2018 0832   BASOSABS 0.0 11/28/2018 0832   CMP Latest Ref Rng & Units 11/28/2018 11/10/2018 10/27/2018  Glucose 70 - 99 mg/dL 107(H) 94 108(H)  BUN 8 - 23 mg/dL _0 Creatinine 0.61 - 1.24 mg/dL 1.58(H) 1.30(H) 1.27(H)  Sodium 135 - 145 mmol/L 138 138 140  Potassium 3.5 - 5.1 mmol/L 4.0 4.0 4.1  Chloride 98 - 111 mmol/L 106 107 108  CO2 22 - 32 mmol/L _1 Calcium 8.9 - 10.3 mg/dL 9.1 8.9 8.5(L)  Total Protein 6.5 - 8.1 g/dL 6.5 6.2(L) 5.9(L)  Total Bilirubin 0.3 - 1.2 mg/dL 0.6 0.6 0.6  Alkaline Phos 38 - 126 U/L 26(L) 28(L) 27(L)  AST 15 - 41 U/L _2 ALT 0 - 44 U/L _3 I have reviewed Francene Finders, NP's note and agree with the documentation.  I personally performed a face-to-face visit, made revisions and my assessment and plan is as follows.      ASSESSMENT & PLAN:   Multiple myeloma ([place]) 1.  Stage II IgG kappa multiple myeloma (Dx February 2018): -RVD from 03/01/2017 through 06/29/2017 -Chemotherapy changed to 3 cycles of CCyD from 07/12/2017-04/2017 to obtain deeper response - Status post auto stem cell transplant on 10/08/2017 -Stable M spike after transplant, started on CCyD, cycle 1 on 02/14/2018, cycle 2 on 03/14/2018 -His M spike after 2 cycles was undetectable, when measured at [facility] last week.  He was recommended to have maintenance with carfilzomib 70 mg/m square on days 1 and 15 every 28 days by Dr. Norma Fredrickson. -He received his first carfilzomib 76m/m2 on 04/20/2018, developed fever with chills 3 hours later with weakness in the extremities.  He was admitted to the hospital, received 1 unit of blood transfusion.  Blood cultures turned out  to be negative.  He was febrile after hospitalization and was sent home. - Subsequently he tolerated carfilzomib and smaller doses very well.  We have increased his doses to 60 mg/m on 09/01/2018 and he tolerated 4 doses very well with the addition of dexamethasone. - We have increased his carfilzomib to 70 mg/m on 10/27/2018.  He tolerated it very well. - We discussed his latest myeloma blood work.  SPEP was 0.1 g/dL.  Free light chain ratio is normal at 1.4.  Immunofixation showed IgG kappa. -There is mild elevation of creatinine to 1.58 which is likely from carfilzomib.  He is receiving hydration pre-and post carfilzomib. - We will closely monitor it.  He may proceed with current treatment.  We will see him back in 4 weeks for follow-up.  2.  Metastatic left breast cancer to the bones (Dx 2011): -he is status post lumpectomy and was started on tamoxifen. -he will continue tamoxifen.  3.  Peripheral neuropathy: His neuropathy has been stable.  He is continuing gabapentin 300 mg twice daily.  4.  Infection prophylaxis: He will continue acyclovir 400 mg twice daily.  5.  Hypertension: He is continuing metoprolol XL 50 mg daily.      Orders placed this encounter:  Orders Placed This Encounter  Procedures  . Lactate dehydrogenase  . Protein electrophoresis, serum  . Kappa/lambda light chains  . CBC with Differential/Platelet  . Comprehensive metabolic panel  . IgG, IgA, IgM      Johnny Jack, MD [place] [phone]

## 2018-11-28 NOTE — Patient Instructions (Signed)
[facility] Discharge Instructions for Patients Receiving Chemotherapy  Today you received the following chemotherapy agents   To help prevent nausea and vomiting after your treatment, we encourage you to take your nausea medication   If you develop nausea and vomiting that is not controlled by your nausea medication, call the clinic.   BELOW ARE SYMPTOMS THAT SHOULD BE REPORTED IMMEDIATELY:  *FEVER GREATER THAN 100.5 F  *CHILLS WITH OR WITHOUT FEVER  NAUSEA AND VOMITING THAT IS NOT CONTROLLED WITH YOUR NAUSEA MEDICATION  *UNUSUAL SHORTNESS OF BREATH  *UNUSUAL BRUISING OR BLEEDING  TENDERNESS IN MOUTH AND THROAT WITH OR WITHOUT PRESENCE OF ULCERS  *URINARY PROBLEMS  *BOWEL PROBLEMS  UNUSUAL RASH Items with * indicate a potential emergency and should be followed up as soon as possible.  Feel free to call the clinic should you have any questions or concerns. The clinic phone number is [phone].  Please show the CHEMO ALERT CARD at check-in to the Emergency Department and triage nurse.   

## 2018-11-28 NOTE — Patient Instructions (Signed)
[facility] at [facility] Discharge Instructions     Thank you for choosing  Cancer Center at [facility] to provide your oncology and hematology care.  To afford each patient quality time with our provider, please arrive at least 15 minutes before your scheduled appointment time.   If you have a lab appointment with the Cancer Center please come in thru the  Main Entrance and check in at the main information desk  You need to re-schedule your appointment should you arrive 10 or more minutes late.  We strive to give you quality time with our providers, and arriving late affects you and other patients whose appointments are after yours.  Also, if you no show three or more times for appointments you may be dismissed from the clinic at the providers discretion.     Again, thank you for choosing [facility].  Our hope is that these requests will decrease the amount of time that you wait before being seen by our physicians.       _____________________________________________________________  Should you have questions after your visit to [facility], please contact our office at [phone] between the hours of 8:00 a.m. and 4:30 p.m.  Voicemails left after 4:00 p.m. will not be returned until the following business day.  For prescription refill requests, have your pharmacy contact our office and allow 72 hours.    Cancer Center Support Programs:   > Cancer Support Group  2nd Tuesday of the month 1pm-2pm, Journey Room    

## 2018-11-29 ENCOUNTER — Emergency Department (HOSPITAL_COMMUNITY): Payer: Medicare Other

## 2018-11-29 ENCOUNTER — Inpatient Hospital Stay (HOSPITAL_COMMUNITY)
Admission: EM | Admit: 2018-11-29 | Discharge: 2018-12-05 | DRG: 871 | Disposition: A | Discharge status: Home / Self Care | Payer: Medicare Other | Attending: Internal Medicine | Admitting: Internal Medicine

## 2018-11-29 ENCOUNTER — Other Ambulatory Visit: Payer: Self-pay

## 2018-11-29 ENCOUNTER — Encounter (HOSPITAL_COMMUNITY): Payer: Self-pay | Admitting: *Deleted

## 2018-11-29 ENCOUNTER — Encounter (HOSPITAL_COMMUNITY): Payer: Self-pay | Admitting: Emergency Medicine

## 2018-11-29 DIAGNOSIS — Z8673 Personal history of transient ischemic attack (TIA), and cerebral infarction without residual deficits: Secondary | ICD-10-CM

## 2018-11-29 DIAGNOSIS — I10 Essential (primary) hypertension: Secondary | ICD-10-CM | POA: Diagnosis not present

## 2018-11-29 DIAGNOSIS — Z87891 Personal history of nicotine dependence: Secondary | ICD-10-CM | POA: Diagnosis not present

## 2018-11-29 DIAGNOSIS — H3530 Unspecified macular degeneration: Secondary | ICD-10-CM | POA: Diagnosis present

## 2018-11-29 DIAGNOSIS — J209 Acute bronchitis, unspecified: Secondary | ICD-10-CM | POA: Diagnosis present

## 2018-11-29 DIAGNOSIS — K219 Gastro-esophageal reflux disease without esophagitis: Secondary | ICD-10-CM | POA: Diagnosis present

## 2018-11-29 DIAGNOSIS — E860 Dehydration: Secondary | ICD-10-CM | POA: Diagnosis present

## 2018-11-29 DIAGNOSIS — Y95 Nosocomial condition: Secondary | ICD-10-CM | POA: Diagnosis present

## 2018-11-29 DIAGNOSIS — Z79899 Other long term (current) drug therapy: Secondary | ICD-10-CM | POA: Diagnosis not present

## 2018-11-29 DIAGNOSIS — E872 Acidosis: Secondary | ICD-10-CM | POA: Diagnosis present

## 2018-11-29 DIAGNOSIS — F419 Anxiety disorder, unspecified: Secondary | ICD-10-CM | POA: Diagnosis present

## 2018-11-29 DIAGNOSIS — T451X5A Adverse effect of antineoplastic and immunosuppressive drugs, initial encounter: Secondary | ICD-10-CM | POA: Diagnosis present

## 2018-11-29 DIAGNOSIS — A419 Sepsis, unspecified organism: Secondary | ICD-10-CM | POA: Diagnosis not present

## 2018-11-29 DIAGNOSIS — R040 Epistaxis: Secondary | ICD-10-CM | POA: Diagnosis not present

## 2018-11-29 DIAGNOSIS — Z809 Family history of malignant neoplasm, unspecified: Secondary | ICD-10-CM | POA: Diagnosis not present

## 2018-11-29 DIAGNOSIS — Z823 Family history of stroke: Secondary | ICD-10-CM

## 2018-11-29 DIAGNOSIS — C9000 Multiple myeloma not having achieved remission: Secondary | ICD-10-CM | POA: Diagnosis not present

## 2018-11-29 DIAGNOSIS — D6481 Anemia due to antineoplastic chemotherapy: Secondary | ICD-10-CM | POA: Diagnosis present

## 2018-11-29 DIAGNOSIS — Z801 Family history of malignant neoplasm of trachea, bronchus and lung: Secondary | ICD-10-CM | POA: Diagnosis not present

## 2018-11-29 DIAGNOSIS — Z7982 Long term (current) use of aspirin: Secondary | ICD-10-CM | POA: Diagnosis not present

## 2018-11-29 DIAGNOSIS — R531 Weakness: Secondary | ICD-10-CM | POA: Diagnosis not present

## 2018-11-29 DIAGNOSIS — N179 Acute kidney failure, unspecified: Secondary | ICD-10-CM | POA: Diagnosis present

## 2018-11-29 DIAGNOSIS — J984 Other disorders of lung: Secondary | ICD-10-CM | POA: Diagnosis not present

## 2018-11-29 DIAGNOSIS — E8809 Other disorders of plasma-protein metabolism, not elsewhere classified: Secondary | ICD-10-CM | POA: Diagnosis present

## 2018-11-29 DIAGNOSIS — H1132 Conjunctival hemorrhage, left eye: Secondary | ICD-10-CM | POA: Diagnosis present

## 2018-11-29 DIAGNOSIS — Z5111 Encounter for antineoplastic chemotherapy: Secondary | ICD-10-CM | POA: Diagnosis not present

## 2018-11-29 DIAGNOSIS — J9811 Atelectasis: Secondary | ICD-10-CM | POA: Diagnosis present

## 2018-11-29 DIAGNOSIS — J42 Unspecified chronic bronchitis: Secondary | ICD-10-CM | POA: Diagnosis not present

## 2018-11-29 DIAGNOSIS — J189 Pneumonia, unspecified organism: Secondary | ICD-10-CM | POA: Diagnosis present

## 2018-11-29 DIAGNOSIS — R509 Fever, unspecified: Secondary | ICD-10-CM | POA: Diagnosis present

## 2018-11-29 DIAGNOSIS — D899 Disorder involving the immune mechanism, unspecified: Secondary | ICD-10-CM | POA: Diagnosis not present

## 2018-11-29 DIAGNOSIS — C7951 Secondary malignant neoplasm of bone: Secondary | ICD-10-CM | POA: Diagnosis not present

## 2018-11-29 DIAGNOSIS — G629 Polyneuropathy, unspecified: Secondary | ICD-10-CM

## 2018-11-29 DIAGNOSIS — R05 Cough: Secondary | ICD-10-CM | POA: Diagnosis not present

## 2018-11-29 DIAGNOSIS — R000 Tachycardia, unspecified: Secondary | ICD-10-CM | POA: Diagnosis not present

## 2018-11-29 DIAGNOSIS — I959 Hypotension, unspecified: Secondary | ICD-10-CM | POA: Diagnosis present

## 2018-11-29 DIAGNOSIS — D696 Thrombocytopenia, unspecified: Secondary | ICD-10-CM | POA: Diagnosis not present

## 2018-11-29 DIAGNOSIS — Z853 Personal history of malignant neoplasm of breast: Secondary | ICD-10-CM

## 2018-11-29 DIAGNOSIS — R7989 Other specified abnormal findings of blood chemistry: Secondary | ICD-10-CM | POA: Diagnosis not present

## 2018-11-29 DIAGNOSIS — B348 Other viral infections of unspecified site: Secondary | ICD-10-CM | POA: Diagnosis not present

## 2018-11-29 DIAGNOSIS — Z79810 Long term (current) use of selective estrogen receptor modulators (SERMs): Secondary | ICD-10-CM

## 2018-11-29 DIAGNOSIS — D849 Immunodeficiency, unspecified: Secondary | ICD-10-CM | POA: Diagnosis present

## 2018-11-29 HISTORY — DX: Polyneuropathy, unspecified: G62.9

## 2018-11-29 LAB — COMPREHENSIVE METABOLIC PANEL
ALT: 22 U/L (ref 0–44)
AST: 49 U/L — ABNORMAL HIGH (ref 15–41)
Albumin: 3.5 g/dL (ref 3.5–5.0)
Alkaline Phosphatase: 26 U/L — ABNORMAL LOW (ref 38–126)
Anion gap: 10 (ref 5–15)
BUN: 31 mg/dL — ABNORMAL HIGH (ref 8–23)
CO2: 21 mmol/L — ABNORMAL LOW (ref 22–32)
Calcium: 8.6 mg/dL — ABNORMAL LOW (ref 8.9–10.3)
Chloride: 105 mmol/L (ref 98–111)
Creatinine, Ser: 2.78 mg/dL — ABNORMAL HIGH (ref 0.61–1.24)
GFR calc non Af Amer: 22 mL/min — ABNORMAL LOW (ref >60)
GFR, EST AFRICAN AMERICAN: 26 mL/min — AB (ref >60)
Glucose, Bld: 117 mg/dL — ABNORMAL HIGH (ref 70–99)
Potassium: 3.3 mmol/L — ABNORMAL LOW (ref 3.5–5.1)
Sodium: 136 mmol/L (ref 135–145)
Total Bilirubin: 0.4 mg/dL (ref 0.3–1.2)
Total Protein: 5.6 g/dL — ABNORMAL LOW (ref 6.5–8.1)

## 2018-11-29 LAB — URINALYSIS, ROUTINE W REFLEX MICROSCOPIC
Bilirubin Urine: NEGATIVE
Glucose, UA: NEGATIVE mg/dL
Hgb urine dipstick: NEGATIVE
Ketones, ur: NEGATIVE mg/dL
Leukocytes, UA: NEGATIVE
Nitrite: NEGATIVE
Protein, ur: NEGATIVE mg/dL
Specific Gravity, Urine: 1.019 (ref 1.005–1.030)
pH: 5 (ref 5.0–8.0)

## 2018-11-29 LAB — CBC WITH DIFFERENTIAL/PLATELET
Abs Immature Granulocytes: 0.05 10*3/uL (ref 0.00–0.07)
Basophils Absolute: 0 10*3/uL (ref 0.0–0.1)
Basophils Relative: 0 %
EOS PCT: 0 %
Eosinophils Absolute: 0 10*3/uL (ref 0.0–0.5)
HEMATOCRIT: 29.9 % — AB (ref 39.0–52.0)
HEMOGLOBIN: 9.7 g/dL — AB (ref 13.0–17.0)
Immature Granulocytes: 1 %
Lymphocytes Relative: 7 %
Lymphs Abs: 0.5 10*3/uL — ABNORMAL LOW (ref 0.7–4.0)
MCH: 34.9 pg — ABNORMAL HIGH (ref 26.0–34.0)
MCHC: 32.4 g/dL (ref 30.0–36.0)
MCV: 107.6 fL — ABNORMAL HIGH (ref 80.0–100.0)
Monocytes Absolute: 0.4 10*3/uL (ref 0.1–1.0)
Monocytes Relative: 5 %
Neutro Abs: 6.5 10*3/uL (ref 1.7–7.7)
Neutrophils Relative %: 87 %
Platelets: 126 10*3/uL — ABNORMAL LOW (ref 150–400)
RBC: 2.78 MIL/uL — ABNORMAL LOW (ref 4.22–5.81)
RDW: 13.7 % (ref 11.5–15.5)
WBC: 7.4 10*3/uL (ref 4.0–10.5)
nRBC: 0 % (ref 0.0–0.2)

## 2018-11-29 LAB — FOLATE: Folate: 99 ng/mL (ref >5.9)

## 2018-11-29 LAB — RESPIRATORY PANEL BY PCR
Adenovirus: NOT DETECTED
BORDETELLA PERTUSSIS-RVPCR: NOT DETECTED
Chlamydophila pneumoniae: NOT DETECTED
Coronavirus 229E: NOT DETECTED
Coronavirus HKU1: NOT DETECTED
Coronavirus NL63: NOT DETECTED
Coronavirus OC43: NOT DETECTED
INFLUENZA A-RVPPCR: NOT DETECTED
Influenza A H1 2009: NOT DETECTED
Influenza A H1: NOT DETECTED
Influenza A H3: NOT DETECTED
Influenza B: NOT DETECTED
Metapneumovirus: NOT DETECTED
Mycoplasma pneumoniae: NOT DETECTED
Parainfluenza Virus 1: DETECTED — AB
Parainfluenza Virus 2: NOT DETECTED
Parainfluenza Virus 3: NOT DETECTED
Parainfluenza Virus 4: NOT DETECTED
Respiratory Syncytial Virus: NOT DETECTED
Rhinovirus / Enterovirus: NOT DETECTED

## 2018-11-29 LAB — LACTIC ACID, PLASMA
Lactic Acid, Venous: 1.8 mmol/L (ref 0.5–1.9)
Lactic Acid, Venous: 1.5 mmol/L (ref 0.5–1.9)

## 2018-11-29 LAB — FERRITIN: Ferritin: 3935 ng/mL — ABNORMAL HIGH (ref 24–336)

## 2018-11-29 LAB — IRON AND TIBC
Iron: 15 ug/dL — ABNORMAL LOW (ref 45–182)
Saturation Ratios: 6 % — ABNORMAL LOW (ref 17.9–39.5)
TIBC: 243 ug/dL — ABNORMAL LOW (ref 250–450)
UIBC: 228 ug/dL

## 2018-11-29 LAB — PROTIME-INR
INR: 1.2
Prothrombin Time: 15.1 seconds (ref 11.4–15.2)

## 2018-11-29 LAB — INFLUENZA PANEL BY PCR (TYPE A & B)
Influenza A By PCR: NEGATIVE
Influenza B By PCR: NEGATIVE

## 2018-11-29 LAB — RETICULOCYTES
Immature Retic Fract: 11.2 % (ref 2.3–15.9)
RBC.: 2.8 MIL/uL — ABNORMAL LOW (ref 4.22–5.81)
Retic Count, Absolute: 16.2 10*3/uL — ABNORMAL LOW (ref 19.0–186.0)
Retic Ct Pct: 0.6 % (ref 0.4–3.1)

## 2018-11-29 LAB — TROPONIN I: Troponin I: 0.03 ng/mL (ref <0.03)

## 2018-11-29 LAB — MRSA PCR SCREENING: MRSA BY PCR: NEGATIVE

## 2018-11-29 LAB — GROUP A STREP BY PCR: Group A Strep by PCR: NOT DETECTED

## 2018-11-29 LAB — VITAMIN B12: Vitamin B-12: 1503 pg/mL — ABNORMAL HIGH (ref 180–914)

## 2018-11-29 MED ORDER — ASPIRIN EC 81 MG PO TBEC
162.0000 mg | DELAYED_RELEASE_TABLET | Freq: Every day | ORAL | Status: DC
  Administered 2018-11-30: 162 mg via ORAL
  Filled 2018-11-29: qty 2

## 2018-11-29 MED ORDER — PANTOPRAZOLE SODIUM 40 MG PO TBEC
40.0000 mg | DELAYED_RELEASE_TABLET | Freq: Every evening | ORAL | Status: DC
  Administered 2018-11-30 – 2018-12-05 (×6): 40 mg via ORAL
  Filled 2018-11-29 (×6): qty 1

## 2018-11-29 MED ORDER — VANCOMYCIN HCL IN DEXTROSE 1-5 GM/200ML-% IV SOLN
1000.0000 mg | Freq: Once | INTRAVENOUS | Status: AC
  Administered 2018-11-29: 1000 mg via INTRAVENOUS
  Filled 2018-11-29: qty 200

## 2018-11-29 MED ORDER — SODIUM CHLORIDE 0.9 % IV SOLN
1000.0000 mL | INTRAVENOUS | Status: DC
  Administered 2018-11-29 – 2018-12-01 (×7): 1000 mL via INTRAVENOUS

## 2018-11-29 MED ORDER — DOCUSATE SODIUM 100 MG PO CAPS: 100.0000 mg | ORAL_CAPSULE | Freq: Two times a day (BID) | ORAL | Status: DC | PRN

## 2018-11-29 MED ORDER — GABAPENTIN 300 MG PO CAPS
300.0000 mg | ORAL_CAPSULE | Freq: Two times a day (BID) | ORAL | Status: DC
  Administered 2018-11-29 – 2018-12-05 (×12): 300 mg via ORAL
  Filled 2018-11-29 (×12): qty 1

## 2018-11-29 MED ORDER — ALBUTEROL SULFATE HFA 108 (90 BASE) MCG/ACT IN AERS: 2.0000 | INHALATION_SPRAY | Freq: Four times a day (QID) | RESPIRATORY_TRACT | Status: DC | PRN

## 2018-11-29 MED ORDER — ALPRAZOLAM 0.5 MG PO TABS
0.5000 mg | ORAL_TABLET | Freq: Every evening | ORAL | Status: DC | PRN
  Administered 2018-12-04: 0.5 mg via ORAL
  Filled 2018-11-29: qty 1

## 2018-11-29 MED ORDER — LORATADINE 10 MG PO TABS
10.0000 mg | ORAL_TABLET | Freq: Every day | ORAL | Status: DC
  Administered 2018-11-30 – 2018-12-05 (×6): 10 mg via ORAL
  Filled 2018-11-29 (×6): qty 1

## 2018-11-29 MED ORDER — ENOXAPARIN SODIUM 30 MG/0.3ML ~~LOC~~ SOLN
30.0000 mg | SUBCUTANEOUS | Status: DC
  Administered 2018-11-29 – 2018-11-30 (×2): 30 mg via SUBCUTANEOUS
  Filled 2018-11-29 (×2): qty 0.3

## 2018-11-29 MED ORDER — CALCIUM CARBONATE-VITAMIN D 500-200 MG-UNIT PO TABS
2.0000 | ORAL_TABLET | Freq: Every day | ORAL | Status: DC
  Administered 2018-11-30 – 2018-12-05 (×6): 2 via ORAL
  Filled 2018-11-29 (×6): qty 2

## 2018-11-29 MED ORDER — TAMOXIFEN CITRATE 10 MG PO TABS
20.0000 mg | ORAL_TABLET | Freq: Every day | ORAL | Status: DC
  Administered 2018-12-01 – 2018-12-05 (×5): 20 mg via ORAL
  Filled 2018-11-29 (×5): qty 2

## 2018-11-29 MED ORDER — SODIUM CHLORIDE 0.9 % IV SOLN
2.0000 g | INTRAVENOUS | Status: AC
  Administered 2018-11-30 – 2018-12-01 (×2): 2 g via INTRAVENOUS
  Filled 2018-11-29 (×3): qty 2

## 2018-11-29 MED ORDER — FOLIC ACID 1 MG PO TABS
1.0000 mg | ORAL_TABLET | Freq: Every day | ORAL | Status: DC
  Administered 2018-11-30 – 2018-12-05 (×6): 1 mg via ORAL
  Filled 2018-11-29 (×6): qty 1

## 2018-11-29 MED ORDER — VANCOMYCIN HCL IN DEXTROSE 1-5 GM/200ML-% IV SOLN: 1000.0000 mg | INTRAVENOUS | Status: DC

## 2018-11-29 MED ORDER — SODIUM CHLORIDE 0.9 % IV BOLUS: 1000.0000 mL | Freq: Once | INTRAVENOUS | Status: DC

## 2018-11-29 MED ORDER — ACETAMINOPHEN 325 MG PO TABS
650.0000 mg | ORAL_TABLET | Freq: Once | ORAL | Status: AC
  Administered 2018-11-29: 650 mg via ORAL
  Filled 2018-11-29: qty 2

## 2018-11-29 MED ORDER — SODIUM CHLORIDE 0.9 % IV SOLN
2.0000 g | Freq: Once | INTRAVENOUS | Status: AC
  Administered 2018-11-29: 2 g via INTRAVENOUS
  Filled 2018-11-29: qty 2

## 2018-11-29 MED ORDER — ALBUTEROL SULFATE (2.5 MG/3ML) 0.083% IN NEBU
2.5000 mg | INHALATION_SOLUTION | Freq: Four times a day (QID) | RESPIRATORY_TRACT | Status: DC | PRN
  Administered 2018-12-03: 2.5 mg via RESPIRATORY_TRACT
  Filled 2018-11-29: qty 3

## 2018-11-29 MED ORDER — ACYCLOVIR 800 MG PO TABS
800.0000 mg | ORAL_TABLET | Freq: Two times a day (BID) | ORAL | Status: DC
  Administered 2018-11-29 – 2018-12-05 (×12): 800 mg via ORAL
  Filled 2018-11-29 (×12): qty 1

## 2018-11-29 MED ORDER — METRONIDAZOLE IN NACL 5-0.79 MG/ML-% IV SOLN
500.0000 mg | Freq: Three times a day (TID) | INTRAVENOUS | Status: DC
  Administered 2018-11-29: 500 mg via INTRAVENOUS
  Filled 2018-11-29: qty 100

## 2018-11-29 MED ORDER — SODIUM CHLORIDE 0.9 % IV BOLUS
30.0000 mL/kg | Freq: Once | INTRAVENOUS | Status: AC
  Administered 2018-11-29: 2763 mL via INTRAVENOUS

## 2018-11-29 MED ORDER — ADULT MULTIVITAMIN W/MINERALS CH
1.0000 | ORAL_TABLET | Freq: Every day | ORAL | Status: DC
  Administered 2018-11-30 – 2018-12-05 (×6): 1 via ORAL
  Filled 2018-11-29 (×6): qty 1

## 2018-11-29 MED ORDER — SODIUM CHLORIDE 0.9 % IV SOLN
INTRAVENOUS | Status: DC | PRN
  Administered 2018-11-29: 1000 mL via INTRAVENOUS

## 2018-11-29 NOTE — H&P (Addendum)
History and Physical  Johnny Lucas OBS:[id number] DOB: 05-25-1946 DOA: 11/29/2018  Referring physician: Thurnell Garbe PCP: [name], MD   Chief Complaint: fever   HPI: Johnny Lucas is a 72 y.o. male who is currently undergoing chemotherapy for multiple myeloma and recently had a infusion done yesterday at the cancer center reportedly has had fever, cough congestion and left eye redness for the past couple of days.  His temperature was up to 102.  He also has been feeling generally weak.  His cough is been nonproductive.  He has had some mild shortness of breath and wheezing.  He denies chest pain.  He denies palpitations.  His wife reports that he had been complaining about chest congestion for a couple of days now.  ED course: Patient was noted to have an oral temperature of 102.  He was noted to be tachycardic with a pulse ox of 90% on room air.  A code sepsis was called and he was empirically started on IV fluids and IV antibiotics.  The patient had an essentially normal lactic acid.  Blood cultures were obtained.  His white blood cell count was noted to be 7.4.  His hemoglobin was noted to be 9.7.  His platelet count was 126.  His lactate was 1.8.  His troponin 0 0.03.  His white blood cell count potassium 3.3 and mild bump in creatinine of 2.78 up from 1.58 yesterday.  He also has an elevated BUN of 31 up from 18 yesterday.  He had a chest x-ray done that was suggestive of bibasilar airspace disease and pneumonia.  Review of Systems: All systems reviewed and apart from history of presenting illness, are negative.  Past Medical History:  Diagnosis Date  . Anxiety   . Bone metastases ([place]) 09/10/2016  . Breast cancer ([place]) 2011   Stave IV breast cancer; radiation and tamoxifen  . Breast cancer, male ([place])    Stave IV breast cancer; radiation and tamoxifen  Overview:  Left breast ca with mets bone  Overview:  METS TO BONE  . GERD (gastroesophageal reflux disease)   . Hypertension   .  Macular degeneration   . Multiple myeloma ([place]) 02/18/2017  . Peripheral neuropathy    Past Surgical History:  Procedure Laterality Date  . BACK SURGERY    . HERNIA REPAIR    . PORTACATH PLACEMENT Left 06/10/2018   Procedure: INSERTION PORT-A-CATH;  Surgeon: Virl Cagey, MD;  Location: [facility];  Service: General;  Laterality: Left;  . right arm surgery     Social History:  reports that he has never smoked. He has quit using smokeless tobacco. He reports that he drinks about 24.0 standard drinks of alcohol per week. He reports that he does not use drugs.  No Known Allergies  Family History  Problem Relation Age of Onset  . Stroke Mother   . Cancer Maternal Aunt        cancer NOS; died in her 37s  . Lung cancer Maternal Uncle        smoker    Prior to Admission medications   Medication Sig Start Date End Date Taking? Authorizing Provider  acyclovir (ZOVIRAX) 800 MG tablet Take 1 tablet (800 mg total) by mouth 2 (two) times daily. 11/10/18   Derek Jack, MD  albuterol (PROVENTIL HFA;VENTOLIN HFA) 108 (90 Base) MCG/ACT inhaler Inhale 2 puffs into the lungs every 6 (six) hours as needed for wheezing or shortness of breath. 03/08/17   Robynn Pane  S, PA-C  ALPRAZolam (XANAX) 0.5 MG tablet TAKE 1 TABLET BY MOUTH AT BEDTIME AS NEEDED FOR ANXIETY 11/14/18   Lockamy, Randi L, NP-C  aspirin EC 81 MG tablet Take 162 mg by mouth daily.     [provider]  calcium-vitamin D (OSCAL WITH D) 500-200 MG-UNIT TABS tablet Take 2 tablets by mouth daily. 09/02/18   Derek Jack, MD  Carfilzomib (KYPROLIS IV) Inject into the vein. Day 1 and 2, day 8 and 9, day 15 and 16 every 28 days for 2 months    [provider]  cephALEXin (KEFLEX) 500 MG capsule Take 500 mg by mouth 3 (three) times daily. 09/28/18   [provider]  denosumab (XGEVA) 120 MG/1.7ML SOLN injection Inject 120 mg into the skin once.    [provider]  docusate sodium (COLACE) 100  MG capsule Take 1 capsule (100 mg total) by mouth 2 (two) times daily as needed. 06/10/18 06/10/19  Virl Cagey, MD  doxycycline (VIBRA-TABS) 100 MG tablet Take 100 mg by mouth 2 (two) times daily. 09/26/18   [provider]  folic acid (FOLVITE) 1 MG tablet Take 1 mg by mouth daily.  10/22/17   [provider]  gabapentin (NEURONTIN) 300 MG capsule Take 1 capsule (300 mg total) by mouth 2 (two) times daily. Take two times a day, then increase to three times a day in a few weeks. 11/10/18   Derek Jack, MD  guaiFENesin (MUCINEX) 600 MG 12 hr tablet Take 600 mg by mouth 2 (two) times daily as needed for cough.    [provider]  guaiFENesin (MUCINEX) 600 MG 12 hr tablet Take 600 mg by mouth 2 (two) times daily.    [provider]  loratadine (CLARITIN) 10 MG tablet Take 1 tablet (10 mg total) by mouth daily. 04/25/18   Holley Bouche, NP  metoprolol succinate (TOPROL-XL) 50 MG 24 hr tablet Take 50 mg by mouth daily.  03/30/17   [provider]  Multiple Vitamin (MULTIVITAMIN WITH MINERALS) TABS tablet Take 1 tablet by mouth daily.    [provider]  ondansetron (ZOFRAN) 8 MG tablet Take 1 tablet (8 mg total) by mouth every 8 (eight) hours as needed for nausea or vomiting. 07/06/17   Baird Cancer, PA-C  pantoprazole (PROTONIX) 40 MG tablet Take 40 mg by mouth every evening.     [provider]  prochlorperazine (COMPAZINE) 10 MG tablet Take 1 tablet (10 mg total) by mouth every 6 (six) hours as needed for nausea or vomiting. 07/06/17   Baird Cancer, PA-C  tamoxifen (NOLVADEX) 20 MG tablet Take 1 tablet (20 mg total) by mouth daily. 08/04/18   Derek Jack, MD   Physical Exam: Vitals:   11/29/18 1030 11/29/18 1038 11/29/18 1051 11/29/18 1100  BP: (!) 88/60 (!) 86/55  (!) 85/58  Pulse: (!) 104  (!) 101 100  Resp: 14 15 (!) 23 (!) 25  Temp:      TempSrc:      SpO2: (!) 89%  93% 92%  Weight:      Height:         General exam: chronically ill appearing and pale, lying comfortably supine on the gurney in no obvious distress.  Head, eyes and ENT: Nontraumatic and normocephalic. Pupils equally reacting to light and accommodation. Oral mucosa dry.  Neck: Supple. No JVD, carotid bruit or thyromegaly.  Lymphatics: No lymphadenopathy.  Respiratory system: bibasilar expiratory wheezes heard and upper anterior  congestion noises. No increased work of breathing.  Cardiovascular system: S1 and S2 heard, tachycardic. No JVD, murmurs, gallops, clicks or pedal edema.  Gastrointestinal system: Abdomen is nondistended, soft and nontender. Normal bowel sounds heard. No organomegaly or masses appreciated.  Central nervous system: Alert and oriented. No focal neurological deficits.  Extremities: trace pretibial edema bilateral LEs. Symmetric 5 x 5 power. Peripheral pulses symmetrically felt.   Skin: No rashes or acute findings.  Musculoskeletal system: Negative exam.  Psychiatry: Pleasant and cooperative.  Labs on Admission:  Basic Metabolic Panel: Recent Labs  Lab 11/28/18 0832 11/29/18 0959  NA 138 136  K 4.0 3.3*  CL 106 105  CO2 26 21*  GLUCOSE 107* 117*  BUN 18 31*  CREATININE 1.58* 2.78*  CALCIUM 9.1 8.6*   Liver Function Tests: Recent Labs  Lab 11/28/18 0832 11/29/18 0959  AST 19 49*  ALT 20 22  ALKPHOS 26* 26*  BILITOT 0.6 0.4  PROT 6.5 5.6*  ALBUMIN 4.1 3.5   No results for input(s): LIPASE, AMYLASE in the last 168 hours. No results for input(s): AMMONIA in the last 168 hours. CBC: Recent Labs  Lab 11/28/18 0832 11/29/18 0959  WBC 2.8* 7.4  NEUTROABS 1.5* 6.5  HGB 11.3* 9.7*  HCT 35.3* 29.9*  MCV 109.6* 107.6*  PLT 161 126*   Cardiac Enzymes: Recent Labs  Lab 11/29/18 0959  TROPONINI 0.03*    BNP (last 3 results) No results for input(s): PROBNP in the last 8760 hours. CBG: No results for input(s): GLUCAP in the last 168 hours.  Radiological Exams on  Admission: Dg Chest 2 View  Result Date: 11/29/2018 CLINICAL DATA:  Fever.  Chemotherapy. EXAM: CHEST - 2 VIEW COMPARISON:  06/14/2018 FINDINGS: Mild patchy bibasilar airspace disease has developed since the prior study. Possible pneumonia given the symptoms. Heart size normal. Negative for heart failure or effusion. Port-A-Cath tip in the SVC Multiple chronic rib fractures on the right. Chronic fracture right humerus and right clavicle. IMPRESSION: Mild bibasilar airspace disease, possible pneumonia. Electronically Signed   By: Franchot Gallo M.D.   On: 11/29/2018 11:28   EKG: Independently reviewed. Sinus tachycardia   Assessment/Plan Principal Problem:   Sepsis due to pneumonia [facility]) Active Problems:   GERD   Bone metastases ([name]   Multiple myeloma ([name]   Multiple myeloma not having achieved remission ([place])   Essential hypertension   HCAP (healthcare-associated pneumonia)   Immunocompromised ([name]   Fever and chills   Generalized weakness   Peripheral neuropathy   Dehydration   Anemia due to chemotherapy   AKI (acute kidney injury) ([place])  1. Sepsis secondary to pneumonia -this patient is immunocompromised and has recently received chemotherapy treatment yesterday.  He has been admitted for broad-spectrum IV antibiotic therapy.  Continue supportive care.  Sepsis protocol started and code sepsis was called.  Continue IV fluid hydration and supportive care. 2. HCAP-he is being treated with cefepime and vancomycin. 3. Acute kidney injury- likely secondary to dehydration he seems prerenal, treating with IV fluids and recheck BMP in the morning. 4. Generalized weakness-multifactorial given recent chemotherapy treatment, dehydration and anemia, treated supportively.  PT evaluation ordered and requested.  5. Hypotension - likely secondary to sepsis, place PICC line, bolus IVFs, start pressors if needed.  Hold home BP meds.  Pt says that he did take his BP medication this morning.    6. Dehydration-treating with IV fluid hydration. 7. Anemia due to chemotherapy-follow CBC closely. 8. Multiple myeloma-currently being treated with chemotherapy.  Follow-up with oncology team after discharge.  DVT Prophylaxis: Lovenox Code Status: Full Family Communication: Wife at bedside Disposition Plan: Inpatient  Critical Care Time spent: 54 minutes  Irwin Brakeman, MD [facility] Pager [phone]  If 7PM-7AM, please contact night-coverage [email or web address] Password [facility] 11/29/2018, 12:17 PM

## 2018-11-29 NOTE — ED Notes (Signed)
Pt had tylenol at 7:40 Am at home

## 2018-11-29 NOTE — Progress Notes (Signed)
Pharmacy Antibiotic Note  Johnny Lucas is a 72 y.o. male admitted on 11/29/2018 with pneumonia.  Pharmacy has been consulted for Vancomycin and Cefepime dosing.  Plan: Vancomycin 2000mg  loading dose, then 1000mg  IV every 24 hours.  Goal trough 15-20 mcg/mL.  Cefepime 2gm IV q24h Also on Flagyl 500mg  IV q8h F/U cxs and clinical progress Monitor V/S, labs, and levels as indicated  Height: 5\' 8"  (172.7 cm) Weight: 203 lb (92.1 kg) IBW/kg (Calculated) : 68.4  Temp (24hrs), Avg:100.8 F (38.2 C), Min:98.7 F (37.1 C), Max:102.9 F (39.4 C)  Recent Labs  Lab 11/28/18 0832 11/29/18 0959  WBC 2.8* 7.4  CREATININE 1.58* 2.78*  LATICACIDVEN  --  1.8    Estimated Creatinine Clearance: 27.2 mL/min (A) (by C-G formula based on SCr of 2.78 mg/dL (H)).    No Known Allergies  Antimicrobials this admission: Vancomycin 12/3 >>  Cefepime 12/3 >>  Metronidazole 12/3>>  Dose adjustments this admission: N/a  Microbiology results: 12/3 BCx: pending 12/3 Grp A strep: not detected   MRSA PCR:   Thank you for allowing pharmacy to be a part of this patient's care.  Isac Sarna, BS Pharm D, BCPS Clinical Pharmacist Pager [phone] 11/29/2018 1:12 PM

## 2018-11-29 NOTE — ED Notes (Signed)
CRITICAL VALUE ALERT  Critical Value:  Troponin 0.03  Date & Time Notied:  8118, 11/29/18  Provider Notified: Dr. Thurnell Garbe  Orders Received/Actions taken: no new orders at this time

## 2018-11-29 NOTE — Progress Notes (Signed)
0900 Patient's wife called clinic stating that Johnny Lucas was running a fever of greater than 102 and was unrelieved with tylenol. He has been c/o congestion for a few days now.  He just received his chemotherapy yesterday.   I advised her to take [name] to the ER for evaluation.

## 2018-11-29 NOTE — ED Provider Notes (Signed)
[facility] EMERGENCY DEPARTMENT Provider Note   CSN: [id number] Arrival date & time: 11/29/18  6203     History   Chief Complaint Chief Complaint  Patient presents with  . Code Sepsis    HPI Johnny Lucas is a 72 y.o. male.  HPI  Pt was seen at 0955. Per pt and his family, c/o gradual onset and persistence of constant cough, runny/stuffy nose, sinus congestion and sore throat for the past 3 days. Has been associated with left eye "redness" that began after coughing. Pt states he was evaluated by his Onc MD yesterday, and received his chemotherapy. Pt states he woke up today with a fever. LD APAP 0740 PTA. Pt also states the [place] called to tell him his WBC count was "low." Denies CP/palpitations, no SOB, no abd pain, no N/V/D, no back pain, no neck pain, no focal motor weakness, no rash, no eye pain, no injury.    Past Medical History:  Diagnosis Date  . Anxiety   . Bone metastases ([place]) 09/10/2016  . Breast cancer ([place]) 2011   Stave IV breast cancer; radiation and tamoxifen  . Breast cancer, male ([place])    Stave IV breast cancer; radiation and tamoxifen  Overview:  Left breast ca with mets bone  Overview:  METS TO BONE  . GERD (gastroesophageal reflux disease)   . Hypertension   . Macular degeneration   . Multiple myeloma ([place]) 02/18/2017  . Peripheral neuropathy     Patient Active Problem List   Diagnosis Date Noted  . HCAP (healthcare-associated pneumonia) 11/29/2018  . Sepsis due to pneumonia ([place]) 11/29/2018  . Immunocompromised ([place]) 11/29/2018  . Fever and chills 11/29/2018  . Generalized weakness 11/29/2018  . Peripheral neuropathy 11/29/2018  . Dehydration 11/29/2018  . Anemia due to chemotherapy 11/29/2018  . Essential hypertension 04/21/2018  . Multiple myeloma not having achieved remission ([place]) 06/29/2017  . Multiple myeloma ([place]) 02/18/2017  . 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 Vasculogenic erectile dysfunction 01/06/2017  . Arthritis 09/10/2016   . Retinopathy 09/10/2016  . Bone metastases ([place]) 09/10/2016  . Use of tamoxifen (Nolvadex) 10/25/2015  . Breast cancer, male ([place])   . TIA (transient ischemic attack) 11/12/2013  . GERD 10/07/2010  . CONSTIPATION 10/07/2010  . RECTAL BLEEDING 10/07/2010    Past Surgical History:  Procedure Laterality Date  . BACK SURGERY    . HERNIA REPAIR    . PORTACATH PLACEMENT Left 06/10/2018   Procedure: INSERTION PORT-A-CATH;  Surgeon: Virl Cagey, MD;  Location: [facility];  Service: General;  Laterality: Left;  . right arm surgery          Home Medications    Prior to Admission medications   Medication Sig Start Date End Date Taking? Authorizing Provider  acyclovir (ZOVIRAX) 800 MG tablet Take 1 tablet (800 mg total) by mouth 2 (two) times daily. 11/10/18   Derek Jack, MD  albuterol (PROVENTIL HFA;VENTOLIN HFA) 108 (90 Base) MCG/ACT inhaler Inhale 2 puffs into the lungs every 6 (six) hours as needed for wheezing or shortness of breath. 03/08/17   Baird Cancer, PA-C  ALPRAZolam Duanne Moron) 0.5 MG tablet TAKE 1 TABLET BY MOUTH AT BEDTIME AS NEEDED FOR ANXIETY 11/14/18   Lockamy, Randi L, NP-C  aspirin EC 81 MG tablet Take 162 mg by mouth daily.     [provider]  calcium-vitamin D (OSCAL WITH D) 500-200 MG-UNIT TABS tablet Take 2 tablets by mouth daily. 09/02/18   Derek Jack, MD  Carfilzomib (  KYPROLIS IV) Inject into the vein. Day 1 and 2, day 8 and 9, day 15 and 16 every 28 days for 2 months    [provider]  cephALEXin (KEFLEX) 500 MG capsule Take 500 mg by mouth 3 (three) times daily. 09/28/18   [provider]  denosumab (XGEVA) 120 MG/1.7ML SOLN injection Inject 120 mg into the skin once.    [provider]  docusate sodium (COLACE) 100 MG capsule Take 1 capsule (100 mg total) by mouth 2 (two) times daily as needed. 06/10/18 06/10/19  Virl Cagey, MD  doxycycline (VIBRA-TABS) 100 MG tablet Take 100 mg by mouth 2 (two)  times daily. 09/26/18   [provider]  folic acid (FOLVITE) 1 MG tablet Take 1 mg by mouth daily.  10/22/17   [provider]  gabapentin (NEURONTIN) 300 MG capsule Take 1 capsule (300 mg total) by mouth 2 (two) times daily. Take two times a day, then increase to three times a day in a few weeks. 11/10/18   Derek Jack, MD  guaiFENesin (MUCINEX) 600 MG 12 hr tablet Take 600 mg by mouth 2 (two) times daily as needed for cough.    [provider]  guaiFENesin (MUCINEX) 600 MG 12 hr tablet Take 600 mg by mouth 2 (two) times daily.    [provider]  loratadine (CLARITIN) 10 MG tablet Take 1 tablet (10 mg total) by mouth daily. 04/25/18   Holley Bouche, NP  metoprolol succinate (TOPROL-XL) 50 MG 24 hr tablet Take 50 mg by mouth daily.  03/30/17   [provider]  Multiple Vitamin (MULTIVITAMIN WITH MINERALS) TABS tablet Take 1 tablet by mouth daily.    [provider]  ondansetron (ZOFRAN) 8 MG tablet Take 1 tablet (8 mg total) by mouth every 8 (eight) hours as needed for nausea or vomiting. 07/06/17   Baird Cancer, PA-C  pantoprazole (PROTONIX) 40 MG tablet Take 40 mg by mouth every evening.     [provider]  prochlorperazine (COMPAZINE) 10 MG tablet Take 1 tablet (10 mg total) by mouth every 6 (six) hours as needed for nausea or vomiting. 07/06/17   Baird Cancer, PA-C  tamoxifen (NOLVADEX) 20 MG tablet Take 1 tablet (20 mg total) by mouth daily. 08/04/18   Derek Jack, MD    Family History Family History  Problem Relation Age of Onset  . Stroke Mother   . Cancer Maternal Aunt        cancer NOS; died in her 43s  . Lung cancer Maternal Uncle        smoker    Social History Social History   Tobacco Use  . Smoking status: Never Smoker  . Smokeless tobacco: Former Network engineer Use Topics  . Alcohol use: Yes    Alcohol/week: 24.0 standard drinks    Types: 24 Cans of beer per week  . Drug use: No      Allergies   Patient has no known allergies.   Review of Systems Review of Systems ROS: Statement: All systems negative except as marked or noted in the HPI; Constitutional: +fever and chills. ; ; Eyes: Negative for eye pain, redness and discharge. ; ; ENMT: Negative for ear pain, hoarseness, +nasal congestion, sinus pressure and sore throat. ; ; Cardiovascular: Negative for chest pain, palpitations, diaphoresis, dyspnea and peripheral edema. ; ; Respiratory: +cough. Negative for wheezing and stridor. ; ; Gastrointestinal: Negative for nausea, vomiting, diarrhea, abdominal pain, blood in stool,  hematemesis, jaundice and rectal bleeding. . ; ; Genitourinary: Negative for dysuria, flank pain and hematuria. ; ; Musculoskeletal: Negative for back pain and neck pain. Negative for swelling and trauma.; ; Skin: Negative for pruritus, rash, abrasions, blisters, bruising and skin lesion.; ; Neuro: Negative for headache, lightheadedness and neck stiffness. Negative for weakness, altered level of consciousness, altered mental status, extremity weakness, paresthesias, involuntary movement, seizure and syncope.       Physical Exam Updated Vital Signs BP (!) 85/58   Pulse 100   Temp (!) 102.9 F (39.4 C) (Oral)   Resp (!) 25   Ht 5' 8"  (1.727 m)   Wt 92.1 kg   SpO2 92%   BMI 30.87 kg/m    Patient Vitals for the past 24 hrs:  BP Temp Temp src Pulse Resp SpO2 Height Weight  11/29/18 1100 (!) 85/58 - - 100 (!) 25 92 % - -  11/29/18 1051 - - - (!) 101 (!) 23 93 % - -  11/29/18 1038 (!) 86/55 - - - 15 - - -  11/29/18 1030 (!) 88/60 - - (!) 104 14 (!) 89 % - -  11/29/18 1000 96/66 - - (!) 108 (!) 24 90 % - -  11/29/18 0948 111/66 (!) 102.9 F (39.4 C) Oral (!) 111 (!) 25 91 % - -  11/29/18 0947 - - - - - - 5' 8"  (1.727 m) 92.1 kg  11/29/18 0945 111/66 - - (!) 110 - 90 % - -     Physical Exam 1000: Physical examination:  Nursing notes reviewed; Vital signs and O2 SAT reviewed;   Constitutional: Well developed, Well nourished, In no acute distress; Head:  Normocephalic, atraumatic; Eyes: EOMI, PERRL, No scleral icterus. +left subconjunctival hemorrhage. No obvious hyphema or hypopyon.; ENMT: Mouth and pharynx normal, Mucous membranes dry; Neck: Supple, Full range of motion, No lymphadenopathy; Cardiovascular: Tachycardic rate and rhythm, No gallop; Respiratory: Breath sounds coarse & equal bilaterally, No wheezes.  Speaking full sentences with ease, Normal respiratory effort/excursion; Chest: Nontender, Movement normal; Abdomen: Soft, Nontender, Nondistended, Normal bowel sounds; Genitourinary: No CVA tenderness; Extremities: Peripheral pulses normal, No tenderness, No edema, No calf edema or asymmetry.; Neuro: AA&Ox3, Major CN grossly intact.  Speech clear. No gross focal motor or sensory deficits in extremities.; Skin: Color normal, Warm, Dry.   ED Treatments / Results  Labs (all labs ordered are listed, but only abnormal results are displayed)   EKG EKG Interpretation  Date/Time:  Tuesday November 29 2018 09:47:21 EST Ventricular Rate:  110 PR Interval:    QRS Duration: 95 QT Interval:  327 QTC Calculation: 443 R Axis:   37 Text Interpretation:  Sinus tachycardia Multiple ventricular premature complexes Borderline T abnormalities, inferior leads When compared with ECG of 04/20/2018 Premature ventricular complexes are now Present Confirmed by Francine Graven [phone]) on 11/29/2018 10:12:27 AM   Radiology   Procedures Procedures (including critical care time)  Medications Ordered in ED Medications  0.9 %  sodium chloride infusion (1,000 mLs Intravenous New Bag/Given 11/29/18 1016)  metroNIDAZOLE (FLAGYL) IVPB 500 mg (500 mg Intravenous New Bag/Given 11/29/18 1024)  0.9 %  sodium chloride infusion (1,000 mLs Intravenous New Bag/Given 11/29/18 1019)  vancomycin (VANCOCIN) IVPB 1000 mg/200 mL premix (has no administration in time range)  acetaminophen (TYLENOL)  tablet 650 mg (has no administration in time range)  ceFEPIme (MAXIPIME) 2 g in sodium chloride 0.9 % 100 mL IVPB ( Intravenous Stopped 11/29/18 1053)  vancomycin (VANCOCIN) IVPB 1000 mg/200 mL  premix (1,000 mg Intravenous New Bag/Given 11/29/18 1105)  sodium chloride 0.9 % bolus 2,763 mL (2,763 mLs Intravenous New Bag/Given 11/29/18 1048)     Initial Impression / Assessment and Plan / ED Course  I have reviewed the triage vital signs and the nursing notes.  Pertinent labs & imaging results that were available during my care of the patient were reviewed by me and considered in my medical decision making (see chart for details).  MDM Reviewed: previous chart, nursing note and vitals Reviewed previous: labs and ECG Interpretation: labs, ECG and x-ray Total time providing critical care: 30-74 minutes. This excludes time spent performing separately reportable procedures and services. Consults: admitting MD   CRITICAL CARE Performed by: Francine Graven Total critical care time: 40 minutes Critical care time was exclusive of separately billable procedures and treating other patients. Critical care was necessary to treat or prevent imminent or life-threatening deterioration. Critical care was time spent personally by me on the following activities: development of treatment plan with patient and/or surrogate as well as nursing, discussions with consultants, evaluation of patient's response to treatment, examination of patient, obtaining history from patient or surrogate, ordering and performing treatments and interventions, ordering and review of laboratory studies, ordering and review of radiographic studies, pulse oximetry and re-evaluation of patient's condition.   Results for orders placed or performed during the hospital encounter of 11/29/18  Culture, blood (Routine x 2)  Result Value Ref Range   Specimen Description BLOOD RIGHT ARM DRAWN BY RN    Special Requests      BOTTLES DRAWN  AEROBIC AND ANAEROBIC Blood Culture adequate volume Performed at [facility], [address], [place], [place] [address]    Culture PENDING    Report Status PENDING   Culture, blood (Routine x 2)  Result Value Ref Range   Specimen Description BLOOD DRAWN BY RN PORTA CATH    Special Requests      BOTTLES DRAWN AEROBIC AND ANAEROBIC Blood Culture adequate volume Performed at [facility], [address], [place], [place] [address]    Culture PENDING    Report Status PENDING   Group A Strep by PCR  Result Value Ref Range   Group A Strep by PCR NOT DETECTED NOT DETECTED  Comprehensive metabolic panel  Result Value Ref Range   Sodium 136 135 - 145 mmol/L   Potassium 3.3 (L) 3.5 - 5.1 mmol/L   Chloride 105 98 - 111 mmol/L   CO2 21 (L) 22 - 32 mmol/L   Glucose, Bld 117 (H) 70 - 99 mg/dL   BUN 31 (H) 8 - 23 mg/dL   Creatinine, Ser 2.78 (H) 0.61 - 1.24 mg/dL   Calcium 8.6 (L) 8.9 - 10.3 mg/dL   Total Protein 5.6 (L) 6.5 - 8.1 g/dL   Albumin 3.5 3.5 - 5.0 g/dL   AST 49 (H) 15 - 41 U/L   ALT 22 0 - 44 U/L   Alkaline Phosphatase 26 (L) 38 - 126 U/L   Total Bilirubin 0.4 0.3 - 1.2 mg/dL   GFR calc non Af Amer 22 (L) >60 mL/min   GFR calc Af Amer 26 (L) >60 mL/min   Anion gap 10 5 - 15  CBC with Differential  Result Value Ref Range   WBC 7.4 4.0 - 10.5 K/uL   RBC 2.78 (L) 4.22 - 5.81 MIL/uL   Hemoglobin 9.7 (L) 13.0 - 17.0 g/dL   HCT 29.9 (L) 39.0 - 52.0 %   MCV 107.6 (H) 80.0 -  100.0 fL   MCH 34.9 (H) 26.0 - 34.0 pg   MCHC 32.4 30.0 - 36.0 g/dL   RDW 13.7 11.5 - 15.5 %   Platelets 126 (L) 150 - 400 K/uL   nRBC 0.0 0.0 - 0.2 %   Neutrophils Relative % 87 %   Neutro Abs 6.5 1.7 - 7.7 K/uL   Lymphocytes Relative 7 %   Lymphs Abs 0.5 (L) 0.7 - 4.0 K/uL   Monocytes Relative 5 %   Monocytes Absolute 0.4 0.1 - 1.0 K/uL   Eosinophils Relative 0 %   Eosinophils Absolute 0.0 0.0 - 0.5 K/uL   Basophils Relative 0 %   Basophils Absolute 0.0 0.0 - 0.1 K/uL   Immature Granulocytes 1 %     Abs Immature Granulocytes 0.05 0.00 - 0.07 K/uL  Protime-INR  Result Value Ref Range   Prothrombin Time 15.1 11.4 - 15.2 seconds   INR 1.20   Lactic acid, plasma  Result Value Ref Range   Lactic Acid, Venous 1.8 0.5 - 1.9 mmol/L  Troponin I - ONCE - STAT  Result Value Ref Range   Troponin I 0.03 (HH) <0.03 ng/mL  Influenza panel by PCR (type A & B)  Result Value Ref Range   Influenza A By PCR NEGATIVE NEGATIVE   Influenza B By PCR NEGATIVE NEGATIVE   Dg Chest 2 View Result Date: 11/29/2018 CLINICAL DATA:  Fever.  Chemotherapy. EXAM: CHEST - 2 VIEW COMPARISON:  06/14/2018 FINDINGS: Mild patchy bibasilar airspace disease has developed since the prior study. Possible pneumonia given the symptoms. Heart size normal. Negative for heart failure or effusion. Port-A-Cath tip in the SVC Multiple chronic rib fractures on the right. Chronic fracture right humerus and right clavicle. IMPRESSION: Mild bibasilar airspace disease, possible pneumonia. Electronically Signed   By: Franchot Gallo M.D.   On: 11/29/2018 11:28     1205:  Code Sepsis called on pt's arrival. IV abx started after [facility] obtained. Initial BP stable, but then started to drop into 80's. IVF NS 38m/kg bolus ordered with slow improvement. BUN/Cr elevated from baseline and pt still unable to urinate; IVF continues. H/H near baseline. Dx and testing d/w pt and family.  Questions answered.  Verb understanding, agreeable to admit. T/C returned from [facility]. JWynetta Emery case discussed, including:  HPI, pertinent PM/SHx, VS/PE, dx testing, ED course and treatment:  Agreeable to admit.     Final Clinical Impressions(s) / ED Diagnoses   Final diagnoses:  Fever in adult  HCAP (healthcare-associated pneumonia)  Dehydration    ED Discharge Orders    None       MFrancine Graven DO 12/03/18 [id number]

## 2018-11-29 NOTE — ED Notes (Signed)
Advised patient we needed urine specimen.  Urinal left at bedside.  °

## 2018-11-29 NOTE — ED Triage Notes (Signed)
Pt had chemo yesterday for MM, Cancer center call to tell him his WBC was low. Pt has fever, cough, congestion, left eye redness.

## 2018-11-30 ENCOUNTER — Inpatient Hospital Stay (HOSPITAL_COMMUNITY): Payer: Medicare Other

## 2018-11-30 DIAGNOSIS — B348 Other viral infections of unspecified site: Secondary | ICD-10-CM | POA: Diagnosis present

## 2018-11-30 DIAGNOSIS — D899 Disorder involving the immune mechanism, unspecified: Secondary | ICD-10-CM

## 2018-11-30 LAB — CBC WITH DIFFERENTIAL/PLATELET
Abs Immature Granulocytes: 0.05 10*3/uL (ref 0.00–0.07)
Basophils Absolute: 0 10*3/uL (ref 0.0–0.1)
Basophils Relative: 0 %
Eosinophils Absolute: 0.2 10*3/uL (ref 0.0–0.5)
Eosinophils Relative: 2 %
HCT: 27.1 % — ABNORMAL LOW (ref 39.0–52.0)
Hemoglobin: 8.4 g/dL — ABNORMAL LOW (ref 13.0–17.0)
Immature Granulocytes: 1 %
Lymphocytes Relative: 11 %
Lymphs Abs: 0.8 10*3/uL (ref 0.7–4.0)
MCH: 33.6 pg (ref 26.0–34.0)
MCHC: 31 g/dL (ref 30.0–36.0)
MCV: 108.4 fL — ABNORMAL HIGH (ref 80.0–100.0)
Monocytes Absolute: 0.3 10*3/uL (ref 0.1–1.0)
Monocytes Relative: 4 %
NRBC: 0 % (ref 0.0–0.2)
Neutro Abs: 6 10*3/uL (ref 1.7–7.7)
Neutrophils Relative %: 82 %
Platelets: 62 10*3/uL — ABNORMAL LOW (ref 150–400)
RBC: 2.5 MIL/uL — ABNORMAL LOW (ref 4.22–5.81)
RDW: 14.3 % (ref 11.5–15.5)
WBC: 7.4 10*3/uL (ref 4.0–10.5)

## 2018-11-30 LAB — COMPREHENSIVE METABOLIC PANEL
ALT: 16 U/L (ref 0–44)
AST: 20 U/L (ref 15–41)
Albumin: 2.7 g/dL — ABNORMAL LOW (ref 3.5–5.0)
Alkaline Phosphatase: 21 U/L — ABNORMAL LOW (ref 38–126)
Anion gap: 6 (ref 5–15)
BILIRUBIN TOTAL: 0.3 mg/dL (ref 0.3–1.2)
BUN: 29 mg/dL — ABNORMAL HIGH (ref 8–23)
CO2: 18 mmol/L — ABNORMAL LOW (ref 22–32)
Calcium: 6.8 mg/dL — ABNORMAL LOW (ref 8.9–10.3)
Chloride: 114 mmol/L — ABNORMAL HIGH (ref 98–111)
Creatinine, Ser: 2.55 mg/dL — ABNORMAL HIGH (ref 0.61–1.24)
GFR calc Af Amer: 28 mL/min — ABNORMAL LOW (ref >60)
GFR calc non Af Amer: 24 mL/min — ABNORMAL LOW (ref >60)
Glucose, Bld: 96 mg/dL (ref 70–99)
POTASSIUM: 3.9 mmol/L (ref 3.5–5.1)
Sodium: 138 mmol/L (ref 135–145)
Total Protein: 4.6 g/dL — ABNORMAL LOW (ref 6.5–8.1)

## 2018-11-30 LAB — STREP PNEUMONIAE URINARY ANTIGEN: Strep Pneumo Urinary Antigen: NEGATIVE

## 2018-11-30 LAB — HIV ANTIBODY (ROUTINE TESTING W REFLEX): HIV SCREEN 4TH GENERATION: NONREACTIVE

## 2018-11-30 LAB — MAGNESIUM: Magnesium: 1.6 mg/dL — ABNORMAL LOW (ref 1.7–2.4)

## 2018-11-30 MED ORDER — ONDANSETRON HCL 4 MG/2ML IJ SOLN
4.0000 mg | Freq: Four times a day (QID) | INTRAMUSCULAR | Status: DC | PRN
  Administered 2018-11-30 – 2018-12-01 (×3): 4 mg via INTRAVENOUS
  Filled 2018-11-30 (×3): qty 2

## 2018-11-30 MED ORDER — MAGNESIUM SULFATE 2 GM/50ML IV SOLN
2.0000 g | Freq: Once | INTRAVENOUS | Status: AC
  Administered 2018-11-30: 2 g via INTRAVENOUS
  Filled 2018-11-30: qty 50

## 2018-11-30 MED ORDER — PROMETHAZINE HCL 25 MG/ML IJ SOLN
12.5000 mg | Freq: Four times a day (QID) | INTRAMUSCULAR | Status: DC | PRN
  Administered 2018-12-02: 12.5 mg via INTRAVENOUS
  Filled 2018-11-30: qty 1

## 2018-11-30 MED ORDER — ONDANSETRON HCL 4 MG PO TABS: 8.0000 mg | ORAL_TABLET | Freq: Three times a day (TID) | ORAL | Status: DC | PRN

## 2018-11-30 MED ORDER — CALCIUM GLUCONATE-NACL 1-0.675 GM/50ML-% IV SOLN
1.0000 g | Freq: Once | INTRAVENOUS | Status: AC
  Administered 2018-11-30: 1000 mg via INTRAVENOUS
  Filled 2018-11-30: qty 50

## 2018-11-30 MED ORDER — IPRATROPIUM-ALBUTEROL 0.5-2.5 (3) MG/3ML IN SOLN
3.0000 mL | Freq: Two times a day (BID) | RESPIRATORY_TRACT | Status: DC
  Administered 2018-12-01: 3 mL via RESPIRATORY_TRACT
  Filled 2018-11-30 (×4): qty 3

## 2018-11-30 MED ORDER — ACETAMINOPHEN 325 MG PO TABS
650.0000 mg | ORAL_TABLET | Freq: Four times a day (QID) | ORAL | Status: DC | PRN
  Administered 2018-11-30 – 2018-12-05 (×2): 650 mg via ORAL
  Filled 2018-11-30 (×3): qty 2

## 2018-11-30 MED ORDER — IPRATROPIUM-ALBUTEROL 0.5-2.5 (3) MG/3ML IN SOLN
3.0000 mL | Freq: Four times a day (QID) | RESPIRATORY_TRACT | Status: DC
  Administered 2018-11-30 (×2): 3 mL via RESPIRATORY_TRACT
  Filled 2018-11-30 (×2): qty 3

## 2018-11-30 NOTE — Evaluation (Signed)
Physical Therapy Evaluation Patient Details Name: Johnny Lucas MRN: [id number] DOB: November 22, 1946 Today's Date: 11/30/2018   History of Present Illness  Johnny Lucas is a 71 y.o. male who is currently undergoing chemotherapy for multiple myeloma and recently had a infusion done yesterday at the cancer center reportedly has had fever, cough congestion and left eye redness for the past couple of days.  His temperature was up to 102.  He also has been feeling generally weak.  His cough is been nonproductive.  He has had some mild shortness of breath and wheezing.  He denies chest pain.  He denies palpitations.  His wife reports that he had been complaining about chest congestion for a couple of days now.    Clinical Impression  Patient functioning near baseline for functional mobility and gait, able to transfer to commode in room and stand at sink to wash hands without problem, ambulated in hallway while on room air with O2 saturations between 90-93%, but desaturated to 88% after returning to room and sitting up in chair - RN notified.  Patient tolerated sitting up in chair after therapy.  Patient discharged to care of nursing for ambulation daily as tolerated for length of stay.     Follow Up Recommendations No PT follow up    Equipment Recommendations  None recommended by PT    Recommendations for Other Services       Precautions / Restrictions Precautions Precautions: None Restrictions Weight Bearing Restrictions: No      Mobility  Bed Mobility Overal bed mobility: Modified Independent             General bed mobility comments: head of bed raised  Transfers Overall transfer level: Modified independent                  Ambulation/Gait Ambulation/Gait assistance: Modified independent (Device/Increase time) Gait Distance (Feet): 150 Feet Assistive device: None Gait Pattern/deviations: WFL(Within Functional Limits) Gait velocity: slightly decreased   General Gait  Details: good return for ambulation in hallway without loss of balance  Stairs            Wheelchair Mobility    Modified Rankin (Stroke Patients Only)       Balance Overall balance assessment: No apparent balance deficits (not formally assessed)                                           Pertinent Vitals/Pain Pain Assessment: 0-10 Pain Score: 4  Pain Location: low back Pain Descriptors / Indicators: Aching Pain Intervention(s): Limited activity within patient's tolerance;Monitored during session    Home Living Family/patient expects to be discharged to:: Private residence Living Arrangements: Spouse/significant other Available Help at Discharge: Family Type of Home: House Home Access: Stairs to enter Entrance Stairs-Rails: Left;Right(to wide to use both) Technical brewer of Steps: 4-5 Home Layout: Two level Home Equipment: Cane - single point;Walker - 2 wheels;Wheelchair - [employer];Shower seat      Prior Function Level of Independence: Independent         Comments: community ambulator, does not drive due to poor vision     Hand Dominance        Extremity/Trunk Assessment   Upper Extremity Assessment Upper Extremity Assessment: Overall WFL for tasks assessed    Lower Extremity Assessment Lower Extremity Assessment: Overall WFL for tasks assessed    Cervical / Trunk Assessment Cervical /  Trunk Assessment: Normal  Communication   Communication: No difficulties  Cognition Arousal/Alertness: Awake/alert Behavior During Therapy: WFL for tasks assessed/performed Overall Cognitive Status: Within Functional Limits for tasks assessed                                        General Comments      Exercises     Assessment/Plan    PT Assessment Patent does not need any further PT services  PT Problem List         PT Treatment Interventions      PT Goals (Current goals can be found in the  Care Plan section)  Acute Rehab PT Goals Patient Stated Goal: return home with family to assist PT Goal Formulation: With patient Time For Goal Achievement: 11/30/18 Potential to Achieve Goals: Good    Frequency     Barriers to discharge        Co-evaluation               AM-PAC PT "6 Clicks" Mobility  Outcome Measure Help needed turning from your back to your side while in a flat bed without using bedrails?: None Help needed moving from lying on your back to sitting on the side of a flat bed without using bedrails?: None Help needed moving to and from a bed to a chair (including a wheelchair)?: None Help needed standing up from a chair using your arms (e.g., wheelchair or bedside chair)?: None Help needed to walk in hospital room?: None Help needed climbing 3-5 steps with a railing? : None 6 Click Score: 24    End of Session   Activity Tolerance: Patient tolerated treatment well Patient left: in chair;with call bell/phone within reach Nurse Communication: Mobility status PT Visit Diagnosis: Unsteadiness on feet (R26.81);Other abnormalities of gait and mobility (R26.89);Muscle weakness (generalized) (M62.81)    Time: 6147-0929 PT Time Calculation (min) (ACUTE ONLY): 22 min   Charges:   PT Evaluation $PT Eval Moderate Complexity: 1 Mod PT Treatments $Therapeutic Activity: 23-37 mins        1:49 PM, 11/30/18 Lonell Grandchild, MPT Physical Therapist with [facility] [phone] office [phone] mobile phone

## 2018-11-30 NOTE — Progress Notes (Signed)
SLP Cancellation Note  Patient Details Name: Johnny Lucas MRN: [id number] DOB: 07-28-1946   Cancelled treatment:       Reason Eval/Treat Not Completed: Patient declined PO trials secondary to nausea. He reports he "cannot eat anything right now". ST will re-attempt later this afternoon as schedule permits. Thank you,  Amelia H. Roddie Mc, CCC-SLP Speech Language Pathologist    Wende Bushy 11/30/2018, 12:15 PM

## 2018-11-30 NOTE — Plan of Care (Signed)
Discussed with patient plan of care for the evening, pain management and with need for zofran increase due to at home medications with some teach back displayed

## 2018-11-30 NOTE — Progress Notes (Signed)
PROGRESS NOTE  Johnny Lucas  JSH:[id number]  DOB: [birth year]  DOA: 11/29/2018 PCP: [name], MD  Brief Admission Hx: 72 y.o. male who is currently undergoing chemotherapy for multiple myeloma and recently had a infusion done yesterday at the cancer center reportedly has had fever, cough congestion and left eye redness for the past couple of days.  His temperature was up to 102.   He was admitted with pneumonia.    MDM/Assessment & Plan:   1. Sepsis secondary to pneumonia -this patient is immunocompromised and has recently received chemotherapy treatment yesterday.  He has been admitted for broad-spectrum IV antibiotic therapy.  Continue supportive care.  Sepsis protocol started and code sepsis was called.  Continue IV fluid hydration and supportive care. 2. Parainfluenza virus - tested positive, treating supportively.  3. HCAP-clinically he is improving, he is being treated with cefepime and vancomycin discontinued as MRSA screen negative. 4. Acute kidney injury- likely secondary to dehydration he seems prerenal, treating with IV fluids and recheck BMP in the morning.  Recheck in AM.  5. Generalized weakness-multifactorial given recent chemotherapy treatment, dehydration and anemia, treated supportively.  PT evaluation ordered and requested.  6. Hypotension - Resolved now.  Likely secondary to sepsis, place PICC line, bolus IVFs, start pressors if needed.  Hold home BP meds.   7. Dehydration-treating with IV fluid hydration. 8. Anemia due to chemotherapy-follow CBC closely. 9. Multiple myeloma-currently being treated with chemotherapy.  Follow-up with oncology team after discharge.  DVT Prophylaxis: Lovenox Code Status: Full Family Communication: Wife at bedside Disposition Plan: transfer to med surg, dc cardiac monitoring  Antimicrobials:  Vancomycin 12/3-12/4  Cefepime 12/3 >  Subjective: Patient says he is feeling much better today.  He is having no chest pain no  shortness of breath no fever.  Objective: Vitals:   11/30/18 0700 11/30/18 0739 11/30/18 0800 11/30/18 1000  BP: 125/74  122/76 139/79  Pulse: 81  73 83  Resp: 18  17 (!) 21  Temp:  98.6 F (37 C)    TempSrc:  Oral    SpO2: 96%  97% 96%  Weight:      Height:        Intake/Output Summary (Last 24 hours) at 11/30/2018 1035 Last data filed at 11/30/2018 1020 Gross per 24 hour  Intake 100 ml  Output 1395 ml  Net -1295 ml   Filed Weights   11/29/18 0947 11/29/18 1331  Weight: 92.1 kg 96.4 kg   REVIEW OF SYSTEMS  As per history otherwise all reviewed and reported negative  Exam:  General exam: awake, alert, cooperative, NAD.  Respiratory system: rales heard, some exp wheezing heard on left,  No increased work of breathing. Cardiovascular system: S1 & S2 heard, RRR. No JVD, murmurs, gallops, clicks or pedal edema. Gastrointestinal system: Abdomen is nondistended, soft and nontender. Normal bowel sounds heard. Central nervous system: Alert and oriented. No focal neurological deficits. Extremities: no cyanosis or clubbing.  Data Reviewed: Basic Metabolic Panel: Recent Labs  Lab 11/28/18 0832 11/29/18 0959 11/30/18 0408  NA 138 136 138  K 4.0 3.3* 3.9  CL 106 105 114*  CO2 26 21* 18*  GLUCOSE 107* 117* 96  BUN 18 31* 29*  CREATININE 1.58* 2.78* 2.55*  CALCIUM 9.1 8.6* 6.8*  MG  --   --  1.6*   Liver Function Tests: Recent Labs  Lab 11/28/18 0832 11/29/18 0959 11/30/18 0408  AST 19 49* 20  ALT '20 22 16  '$ ALKPHOS 26* 26* 21*  BILITOT 0.6 0.4 0.3  PROT 6.5 5.6* 4.6*  ALBUMIN 4.1 3.5 2.7*   No results for input(s): LIPASE, AMYLASE in the last 168 hours. No results for input(s): AMMONIA in the last 168 hours. CBC: Recent Labs  Lab 11/28/18 0832 11/29/18 0959 11/30/18 0408  WBC 2.8* 7.4 7.4  NEUTROABS 1.5* 6.5 6.0  HGB 11.3* 9.7* 8.4*  HCT 35.3* 29.9* 27.1*  MCV 109.6* 107.6* 108.4*  PLT 161 126* 62*   Cardiac Enzymes: Recent Labs  Lab  11/29/18 0959  TROPONINI 0.03*   CBG (last 3)  No results for input(s): GLUCAP in the last 72 hours. Recent Results (from the past 240 hour(s))  Culture, blood (Routine x 2)     Status: None (Preliminary result)   Collection Time: 11/29/18  9:59 AM  Result Value Ref Range Status   Specimen Description BLOOD RIGHT ARM DRAWN BY RN  Final   Special Requests   Final    BOTTLES DRAWN AEROBIC AND ANAEROBIC Blood Culture adequate volume   Culture   Final    NO GROWTH < 24 HOURS Performed at [facility], [address] [place], [place] [address]    Report Status PENDING  Incomplete  Culture, blood (Routine x 2)     Status: None (Preliminary result)   Collection Time: 11/29/18 10:11 AM  Result Value Ref Range Status   Specimen Description BLOOD DRAWN BY RN PORTA CATH  Final   Special Requests   Final    BOTTLES DRAWN AEROBIC AND ANAEROBIC Blood Culture adequate volume   Culture   Final    NO GROWTH < 24 HOURS Performed at [facility], [address], [place], [place] [address]    Report Status PENDING  Incomplete  Group A Strep by PCR     Status: None   Collection Time: 11/29/18 10:23 AM  Result Value Ref Range Status   Group A Strep by PCR NOT DETECTED NOT DETECTED Final    Comment: Performed at [facility], [address] [place], [place] [address]  Respiratory Panel by PCR     Status: Abnormal   Collection Time: 11/29/18 10:23 AM  Result Value Ref Range Status   Adenovirus NOT DETECTED NOT DETECTED Final   Coronavirus 229E NOT DETECTED NOT DETECTED Final   Coronavirus HKU1 NOT DETECTED NOT DETECTED Final   Coronavirus NL63 NOT DETECTED NOT DETECTED Final   Coronavirus OC43 NOT DETECTED NOT DETECTED Final   Metapneumovirus NOT DETECTED NOT DETECTED Final   Rhinovirus / Enterovirus NOT DETECTED NOT DETECTED Final   Influenza A NOT DETECTED NOT DETECTED Final   Influenza A H1 NOT DETECTED NOT DETECTED Final   Influenza A H1 2009 NOT DETECTED NOT DETECTED Final   Influenza A H3  NOT DETECTED NOT DETECTED Final   Influenza B NOT DETECTED NOT DETECTED Final   Parainfluenza Virus 1 DETECTED (A) NOT DETECTED Final   Parainfluenza Virus 2 NOT DETECTED NOT DETECTED Final   Parainfluenza Virus 3 NOT DETECTED NOT DETECTED Final   Parainfluenza Virus 4 NOT DETECTED NOT DETECTED Final   Respiratory Syncytial Virus NOT DETECTED NOT DETECTED Final   Bordetella pertussis NOT DETECTED NOT DETECTED Final   Chlamydophila pneumoniae NOT DETECTED NOT DETECTED Final   Mycoplasma pneumoniae NOT DETECTED NOT DETECTED Final  MRSA PCR Screening     Status: None   Collection Time: 11/29/18  3:15 PM  Result Value Ref Range Status   MRSA by PCR NEGATIVE NEGATIVE Final    Comment:  The GeneXpert MRSA Assay (FDA approved for NASAL specimens only), is one component of a comprehensive MRSA colonization surveillance program. It is not intended to diagnose MRSA infection nor to guide or monitor treatment for MRSA infections. Performed at [facility], [address], [place], [place] [address]      Studies: Dg Chest 2 View  Result Date: 11/29/2018 CLINICAL DATA:  Fever.  Chemotherapy. EXAM: CHEST - 2 VIEW COMPARISON:  06/14/2018 FINDINGS: Mild patchy bibasilar airspace disease has developed since the prior study. Possible pneumonia given the symptoms. Heart size normal. Negative for heart failure or effusion. Port-A-Cath tip in the SVC Multiple chronic rib fractures on the right. Chronic fracture right humerus and right clavicle. IMPRESSION: Mild bibasilar airspace disease, possible pneumonia. Electronically Signed   By: Franchot Gallo M.D.   On: 11/29/2018 11:28     Scheduled Meds: . acyclovir  800 mg Oral BID  . aspirin EC  162 mg Oral Daily  . calcium-vitamin D  2 tablet Oral Daily  . enoxaparin (LOVENOX) injection  30 mg Subcutaneous Q24H  . folic acid  1 mg Oral Daily  . gabapentin  300 mg Oral BID  . loratadine  10 mg Oral Daily  . multivitamin with minerals  1 tablet  Oral Daily  . pantoprazole  40 mg Oral QPM  . [START ON 12/01/2018] tamoxifen  20 mg Oral Daily   Continuous Infusions: . sodium chloride 1,000 mL (11/30/18 1011)  . sodium chloride 1,000 mL (11/29/18 1019)  . calcium gluconate 1,000 mg (11/30/18 1014)  . ceFEPime (MAXIPIME) IV    . magnesium sulfate 1 - 4 g bolus IVPB 2 g (11/30/18 1019)  . sodium chloride      Principal Problem:   Sepsis due to pneumonia [facility]) Active Problems:   GERD   Bone metastases ([name]   Multiple myeloma ([name]   Multiple myeloma not having achieved remission ([name]   Essential hypertension   HCAP (healthcare-associated pneumonia)   Immunocompromised ([name]   Fever and chills   Generalized weakness   Peripheral neuropathy   Dehydration   Anemia due to chemotherapy   AKI (acute kidney injury) ([place])   Hypotension  Critical Care Time spent: 31 minutes    Irwin Brakeman, MD, FAAFP [facility] Pager [phone]  If 7PM-7AM, please contact night-coverage [email or web address] Password [email or web address] 11/30/2018, 10:35 AM    LOS: 1 day

## 2018-12-01 ENCOUNTER — Inpatient Hospital Stay (HOSPITAL_COMMUNITY): Payer: Medicare Other

## 2018-12-01 DIAGNOSIS — C9000 Multiple myeloma not having achieved remission: Secondary | ICD-10-CM

## 2018-12-01 DIAGNOSIS — K219 Gastro-esophageal reflux disease without esophagitis: Secondary | ICD-10-CM

## 2018-12-01 DIAGNOSIS — Z809 Family history of malignant neoplasm, unspecified: Secondary | ICD-10-CM

## 2018-12-01 DIAGNOSIS — R040 Epistaxis: Secondary | ICD-10-CM

## 2018-12-01 DIAGNOSIS — I10 Essential (primary) hypertension: Secondary | ICD-10-CM

## 2018-12-01 DIAGNOSIS — G629 Polyneuropathy, unspecified: Secondary | ICD-10-CM

## 2018-12-01 DIAGNOSIS — Z801 Family history of malignant neoplasm of trachea, bronchus and lung: Secondary | ICD-10-CM

## 2018-12-01 DIAGNOSIS — Z853 Personal history of malignant neoplasm of breast: Secondary | ICD-10-CM

## 2018-12-01 DIAGNOSIS — F419 Anxiety disorder, unspecified: Secondary | ICD-10-CM

## 2018-12-01 DIAGNOSIS — D696 Thrombocytopenia, unspecified: Secondary | ICD-10-CM

## 2018-12-01 LAB — D-DIMER, QUANTITATIVE: D-Dimer, Quant: 1.12 ug/mL-FEU — ABNORMAL HIGH (ref 0.00–0.50)

## 2018-12-01 LAB — CBC WITH DIFFERENTIAL/PLATELET
Abs Immature Granulocytes: 0.04 10*3/uL (ref 0.00–0.07)
Basophils Absolute: 0 10*3/uL (ref 0.0–0.1)
Basophils Relative: 0 %
Eosinophils Absolute: 0 10*3/uL (ref 0.0–0.5)
Eosinophils Relative: 0 %
HCT: 28.1 % — ABNORMAL LOW (ref 39.0–52.0)
Hemoglobin: 8.7 g/dL — ABNORMAL LOW (ref 13.0–17.0)
Immature Granulocytes: 1 %
LYMPHS ABS: 0.5 10*3/uL — AB (ref 0.7–4.0)
Lymphocytes Relative: 8 %
MCH: 34 pg (ref 26.0–34.0)
MCHC: 31 g/dL (ref 30.0–36.0)
MCV: 109.8 fL — ABNORMAL HIGH (ref 80.0–100.0)
Monocytes Absolute: 0.2 10*3/uL (ref 0.1–1.0)
Monocytes Relative: 4 %
Neutro Abs: 4.8 10*3/uL (ref 1.7–7.7)
Neutrophils Relative %: 87 %
Platelets: 21 10*3/uL — CL (ref 150–400)
RBC: 2.56 MIL/uL — ABNORMAL LOW (ref 4.22–5.81)
RDW: 14.3 % (ref 11.5–15.5)
WBC: 5.5 10*3/uL (ref 4.0–10.5)
nRBC: 0 % (ref 0.0–0.2)

## 2018-12-01 LAB — COMPREHENSIVE METABOLIC PANEL
ALBUMIN: 2.8 g/dL — AB (ref 3.5–5.0)
ALT: 15 U/L (ref 0–44)
AST: 23 U/L (ref 15–41)
Alkaline Phosphatase: 23 U/L — ABNORMAL LOW (ref 38–126)
Anion gap: 5 (ref 5–15)
BILIRUBIN TOTAL: 0.5 mg/dL (ref 0.3–1.2)
BUN: 21 mg/dL (ref 8–23)
CO2: 16 mmol/L — ABNORMAL LOW (ref 22–32)
Calcium: 7.2 mg/dL — ABNORMAL LOW (ref 8.9–10.3)
Chloride: 118 mmol/L — ABNORMAL HIGH (ref 98–111)
Creatinine, Ser: 1.78 mg/dL — ABNORMAL HIGH (ref 0.61–1.24)
GFR calc Af Amer: 44 mL/min — ABNORMAL LOW (ref >60)
GFR calc non Af Amer: 38 mL/min — ABNORMAL LOW (ref >60)
GLUCOSE: 97 mg/dL (ref 70–99)
POTASSIUM: 3.9 mmol/L (ref 3.5–5.1)
Sodium: 139 mmol/L (ref 135–145)
Total Protein: 4.8 g/dL — ABNORMAL LOW (ref 6.5–8.1)

## 2018-12-01 LAB — APTT: aPTT: 37 seconds — ABNORMAL HIGH (ref 24–36)

## 2018-12-01 LAB — PROTIME-INR
INR: 1.1
Prothrombin Time: 14.1 seconds (ref 11.4–15.2)

## 2018-12-01 LAB — URINE CULTURE: Culture: NO GROWTH

## 2018-12-01 LAB — FIBRINOGEN: Fibrinogen: 406 mg/dL (ref 210–475)

## 2018-12-01 LAB — MAGNESIUM: Magnesium: 2.1 mg/dL (ref 1.7–2.4)

## 2018-12-01 MED ORDER — DOXYCYCLINE HYCLATE 100 MG PO TABS
100.0000 mg | ORAL_TABLET | Freq: Two times a day (BID) | ORAL | Status: AC
  Administered 2018-12-01 – 2018-12-02 (×2): 100 mg via ORAL
  Filled 2018-12-01 (×2): qty 1

## 2018-12-01 MED ORDER — SODIUM CHLORIDE 0.9 % IV SOLN
1000.0000 mL | INTRAVENOUS | Status: DC
  Administered 2018-12-01: 1000 mL via INTRAVENOUS

## 2018-12-01 MED ORDER — SACCHAROMYCES BOULARDII 250 MG PO CAPS
250.0000 mg | ORAL_CAPSULE | Freq: Two times a day (BID) | ORAL | Status: DC
  Administered 2018-12-01 – 2018-12-05 (×9): 250 mg via ORAL
  Filled 2018-12-01 (×9): qty 1

## 2018-12-01 MED ORDER — TECHNETIUM TC 99M DIETHYLENETRIAME-PENTAACETIC ACID
30.0000 | Freq: Once | INTRAVENOUS | Status: AC | PRN
  Administered 2018-12-01: 31 via RESPIRATORY_TRACT

## 2018-12-01 MED ORDER — HYDRALAZINE HCL 20 MG/ML IJ SOLN
10.0000 mg | Freq: Four times a day (QID) | INTRAMUSCULAR | Status: DC | PRN
  Administered 2018-12-01 – 2018-12-02 (×2): 10 mg via INTRAVENOUS
  Filled 2018-12-01 (×2): qty 1

## 2018-12-01 MED ORDER — TECHNETIUM TO 99M ALBUMIN AGGREGATED
4.0000 | Freq: Once | INTRAVENOUS | Status: AC | PRN
  Administered 2018-12-01: 4.2 via INTRAVENOUS

## 2018-12-01 NOTE — Progress Notes (Signed)
CRITICAL VALUE ALERT  Critical Value:  Platelet 21  Date & Time Notied:  12/01/18 @ 0600  Provider Notified: Dr. Olevia Bowens  Orders Received/Actions taken: Waiting for orders/call back.

## 2018-12-01 NOTE — Evaluation (Signed)
Clinical/Bedside Swallow Evaluation Patient Details  Name: ADRIANO BISCHOF MRN: [id number] Date of Birth: [birth year]  Today's Date: 12/01/2018 Time: SLP Start Time (ACUTE ONLY): 39 SLP Stop Time (ACUTE ONLY): 0940 SLP Time Calculation (min) (ACUTE ONLY): 20 min  Past Medical History:  Past Medical History:  Diagnosis Date  . Anxiety   . Bone metastases ([place]) 09/10/2016  . Breast cancer ([place]) 2011   Stave IV breast cancer; radiation and tamoxifen  . Breast cancer, male ([place])    Stave IV breast cancer; radiation and tamoxifen  Overview:  Left breast ca with mets bone  Overview:  METS TO BONE  . GERD (gastroesophageal reflux disease)   . Hypertension   . Macular degeneration   . Multiple myeloma ([place]) 02/18/2017  . Peripheral neuropathy    Past Surgical History:  Past Surgical History:  Procedure Laterality Date  . BACK SURGERY    . HERNIA REPAIR    . PORTACATH PLACEMENT Left 06/10/2018   Procedure: INSERTION PORT-A-CATH;  Surgeon: Virl Cagey, MD;  Location: [facility];  Service: General;  Laterality: Left;  . right arm surgery     HPI:  HAMED DEBELLA is a 72 y.o. male who is currently undergoing chemotherapy for multiple myeloma and recently had a infusion done yesterday at the cancer center reportedly has had fever, cough congestion and left eye redness for the past couple of days.  His temperature was up to 102.  He also has been feeling generally weak.  His cough is been nonproductive.  He has had some mild shortness of breath and wheezing.  He denies chest pain.  He denies palpitations.  His wife reports that he had been complaining about chest congestion for a couple of days now.  He had a chest x-ray done that was suggestive of bibasilar airspace disease and pneumonia.   Assessment / Plan / Recommendation Clinical Impression  Pt seen at bedside for a clinical swallow evaluation. He was sitting at the edge of the bed self feeding saltines and Coke. Oral motor examination is  WNL, no gross asymmetry. Pt denies difficulty swallowing, stasis, or choking during po. SLP also provided sherbet and puree and Pt exhibits no signs or symptoms of aspiration. He is most bothered by nausea, but is agreable to trying Boost, Ensure, and [employer]. Will notify RD. Recommend regular textures and thin liquids with standard aspiration and reflux precautions. SLP will sign off. Reconsult if indicated.  SLP Visit Diagnosis: Dysphagia, unspecified (R13.10)    Aspiration Risk  No limitations    Diet Recommendation Regular;Thin liquid   Liquid Administration via: Cup;Straw Medication Administration: Whole meds with liquid Supervision: Patient able to self feed Postural Changes: Seated upright at 90 degrees;Remain upright for at least 30 minutes after po intake    Other  Recommendations Oral Care Recommendations: Oral care BID Other Recommendations: Clarify dietary restrictions   Follow up Recommendations None      Frequency and Duration            Prognosis Prognosis for Safe Diet Advancement: Good      Swallow Study   General Date of Onset: 11/29/18 HPI: DEMBA NIGH is a 72 y.o. male who is currently undergoing chemotherapy for multiple myeloma and recently had a infusion done yesterday at the cancer center reportedly has had fever, cough congestion and left eye redness for the past couple of days.  His temperature was up to 102.  He also has been feeling generally weak.  His  cough is been nonproductive.  He has had some mild shortness of breath and wheezing.  He denies chest pain.  He denies palpitations.  His wife reports that he had been complaining about chest congestion for a couple of days now.  He had a chest x-ray done that was suggestive of bibasilar airspace disease and pneumonia. Type of Study: Bedside Swallow Evaluation Previous Swallow Assessment: none in chart Diet Prior to this Study: Regular;Thin liquids Temperature Spikes Noted: No Respiratory Status:  Room air History of Recent Intubation: No Behavior/Cognition: Alert;Cooperative Oral Cavity Assessment: Within Functional Limits Oral Care Completed by SLP: No Oral Cavity - Dentition: Adequate natural dentition Vision: Functional for self-feeding Self-Feeding Abilities: Able to feed self Patient Positioning: (sitting on edge of bed) Baseline Vocal Quality: Normal Volitional Cough: Strong Volitional Swallow: Able to elicit    Oral/Motor/Sensory Function Overall Oral Motor/Sensory Function: Within functional limits   Ice Chips Ice chips: Within functional limits Presentation: Spoon   Thin Liquid Thin Liquid: Within functional limits Presentation: Cup;Self Fed;Straw    Nectar Thick Nectar Thick Liquid: Not tested   Honey Thick Honey Thick Liquid: Not tested   Puree Puree: Within functional limits Presentation: Spoon   Solid     Solid: Within functional limits Presentation: Self Fed     Thank you,  Genene Churn, [place]  Shaquetta Arcos 12/01/2018,11:25 AM

## 2018-12-01 NOTE — Progress Notes (Signed)
PROGRESS NOTE  Johnny Lucas  VZC:[id number]  DOB: [birth year]  DOA: 11/29/2018 PCP: [name], MD  Brief Admission Hx: 72 y.o. male who is currently undergoing chemotherapy for multiple myeloma and recently had a infusion done yesterday at the cancer center reportedly has had fever, cough congestion and left eye redness for the past couple of days.  His temperature was up to 102.   He was admitted with pneumonia.    MDM/Assessment & Plan:   1. Sepsis secondary to pneumonia -this patient is immunocompromised and has recently received chemotherapy treatment yesterday.  He has been admitted for broad-spectrum IV antibiotic therapy.  Continue supportive care.  Sepsis protocol started and code sepsis was called.  Continue IV fluid hydration and supportive care. 2. Parainfluenza virus - tested positive, treating supportively.  3. Thrombocytopenia - acute drop in platelets, no active bleeding, recently had chemo treatment, will consult hem/onc.  Dc lovenox, SCDs.  4. Elevated D dimer - Pt continues to have SOB, will obtain V/Q scan.  5. HCAP-clinically he is improving, he is being treated with cefepime and vancomycin discontinued as MRSA screen negative. De-escalate antibiotics. 6. Acute kidney injury- creatinine is improving, likely secondary to dehydration he seems prerenal, treating with IV fluids and recheck BMP in the morning.  Recheck in AM.  7. Loose stools - likely from antibiotics and recent chemo treatments.  Treat supportively. If persists, do further testing.  Add probiotics.  8. Generalized weakness-multifactorial given recent chemotherapy treatment, dehydration and anemia, treated supportively.  PT evaluation ordered and requested.  9. Hypotension - Resolved now.  Likely secondary to sepsis, place PICC line, bolus IVFs, start pressors if needed.  Hold home BP meds.   10. Dehydration-treating with IV fluid hydration. 11. Anemia due to chemotherapy-follow CBC closely. 12. Multiple  myeloma-currently being treated with chemotherapy.  Follow-up with oncology team after discharge.  I have consulted inpatient oncology team to see patient.   DVT Prophylaxis: Lovenox Code Status: Full Family Communication: Wife at bedside  Antimicrobials:  Vancomycin 12/3-12/4  Cefepime 12/3 >12/5  Doxycycline 12/5  Subjective: Patient says he feels terrible.  He is having loose stools.    Objective: Vitals:   11/30/18 2121 12/01/18 0010 12/01/18 0437 12/01/18 0816  BP: (!) 157/84     Pulse: 95     Resp: 18     Temp: (!) 100.8 F (38.2 C) 99 F (37.2 C) 98.1 F (36.7 C) 98.3 F (36.8 C)  TempSrc: Oral Oral Oral Oral  SpO2: 95%   95%  Weight:   97.9 kg   Height:        Intake/Output Summary (Last 24 hours) at 12/01/2018 0824 Last data filed at 12/01/2018 0437 Gross per 24 hour  Intake 6370.47 ml  Output 1200 ml  Net 5170.47 ml   Filed Weights   11/29/18 0947 11/29/18 1331 12/01/18 0437  Weight: 92.1 kg 96.4 kg 97.9 kg   REVIEW OF SYSTEMS  As per history otherwise all reviewed and reported negative  Exam:  General exam: awake, alert, cooperative, NAD.  Respiratory system: rales heard, some exp wheezing heard on left,  No increased work of breathing. Cardiovascular system: S1 & S2 heard, RRR. No JVD, murmurs, gallops, clicks or pedal edema. Gastrointestinal system: Abdomen is nondistended, soft and nontender. Normal bowel sounds heard. Central nervous system: Alert and oriented. No focal neurological deficits. Extremities: no cyanosis or clubbing.  Data Reviewed: Basic Metabolic Panel: Recent Labs  Lab 11/28/18 1287 11/29/18 8676 11/30/18 0408 12/01/18 0355  NA 138 136 138 139  K 4.0 3.3* 3.9 3.9  CL 106 105 114* 118*  CO2 26 21* 18* 16*  GLUCOSE 107* 117* 96 97  BUN 18 31* 29* 21  CREATININE 1.58* 2.78* 2.55* 1.78*  CALCIUM 9.1 8.6* 6.8* 7.2*  MG  --   --  1.6* 2.1   Liver Function Tests: Recent Labs  Lab 11/28/18 0832 11/29/18 0959  11/30/18 0408 12/01/18 0355  AST 19 49* 20 23  ALT 20 22 16 15   ALKPHOS 26* 26* 21* 23*  BILITOT 0.6 0.4 0.3 0.5  PROT 6.5 5.6* 4.6* 4.8*  ALBUMIN 4.1 3.5 2.7* 2.8*   No results for input(s): LIPASE, AMYLASE in the last 168 hours. No results for input(s): AMMONIA in the last 168 hours. CBC: Recent Labs  Lab 11/28/18 0832 11/29/18 0959 11/30/18 0408 12/01/18 0355  WBC 2.8* 7.4 7.4 5.5  NEUTROABS 1.5* 6.5 6.0 4.8  HGB 11.3* 9.7* 8.4* 8.7*  HCT 35.3* 29.9* 27.1* 28.1*  MCV 109.6* 107.6* 108.4* 109.8*  PLT 161 126* 62* 21*   Cardiac Enzymes: Recent Labs  Lab 11/29/18 0959  TROPONINI 0.03*   CBG (last 3)  No results for input(s): GLUCAP in the last 72 hours. Recent Results (from the past 240 hour(s))  Culture, blood (Routine x 2)     Status: None (Preliminary result)   Collection Time: 11/29/18  9:59 AM  Result Value Ref [place] Status   Specimen Description BLOOD RIGHT ARM DRAWN BY RN  Final   Special Requests   Final    BOTTLES DRAWN AEROBIC AND ANAEROBIC Blood Culture adequate volume   Culture   Final    NO GROWTH 2 DAYS Performed at [facility], [address], [place], [place] [address]    Report Status PENDING  Incomplete  Culture, blood (Routine x 2)     Status: None (Preliminary result)   Collection Time: 11/29/18 10:11 AM  Result Value Ref [place] Status   Specimen Description BLOOD DRAWN BY RN PORTA CATH  Final   Special Requests   Final    BOTTLES DRAWN AEROBIC AND ANAEROBIC Blood Culture adequate volume   Culture   Final    NO GROWTH 2 DAYS Performed at [facility], [address], [place], [place] [address]    Report Status PENDING  Incomplete  Group A Strep by PCR     Status: None   Collection Time: 11/29/18 10:23 AM  Result Value Ref [place] Status   Group A Strep by PCR NOT DETECTED NOT DETECTED Final    Comment: Performed at [facility], [address], [place],  [address]  Respiratory Panel by PCR     Status: Abnormal   Collection Time:  11/29/18 10:23 AM  Result Value Ref [place] Status   Adenovirus NOT DETECTED NOT DETECTED Final   Coronavirus 229E NOT DETECTED NOT DETECTED Final   Coronavirus HKU1 NOT DETECTED NOT DETECTED Final   Coronavirus NL63 NOT DETECTED NOT DETECTED Final   Coronavirus OC43 NOT DETECTED NOT DETECTED Final   Metapneumovirus NOT DETECTED NOT DETECTED Final   Rhinovirus / Enterovirus NOT DETECTED NOT DETECTED Final   Influenza A NOT DETECTED NOT DETECTED Final   Influenza A H1 NOT DETECTED NOT DETECTED Final   Influenza A H1 2009 NOT DETECTED NOT DETECTED Final   Influenza A H3 NOT DETECTED NOT DETECTED Final   Influenza B NOT DETECTED NOT DETECTED Final   Parainfluenza Virus 1 DETECTED (A) NOT DETECTED Final  Parainfluenza Virus 2 NOT DETECTED NOT DETECTED Final   Parainfluenza Virus 3 NOT DETECTED NOT DETECTED Final   Parainfluenza Virus 4 NOT DETECTED NOT DETECTED Final   Respiratory Syncytial Virus NOT DETECTED NOT DETECTED Final   Bordetella pertussis NOT DETECTED NOT DETECTED Final   Chlamydophila pneumoniae NOT DETECTED NOT DETECTED Final   Mycoplasma pneumoniae NOT DETECTED NOT DETECTED Final  MRSA PCR Screening     Status: None   Collection Time: 11/29/18  3:15 PM  Result Value Ref [place] Status   MRSA by PCR NEGATIVE NEGATIVE Final    Comment:        The GeneXpert MRSA Assay (FDA approved for NASAL specimens only), is one component of a comprehensive MRSA colonization surveillance program. It is not intended to diagnose MRSA infection nor to guide or monitor treatment for MRSA infections. Performed at [facility], [address] [place], [place] [address]   Urine culture     Status: None   Collection Time: 11/29/18  3:24 PM  Result Value Ref [place] Status   Specimen Description   Final    URINE, RANDOM Performed at [facility], [address], [place], [place] [address]    Special Requests   Final    NONE Performed at [facility], [address], [place], [place]  [address]    Culture   Final    NO GROWTH Performed at [facility], [place] [address], [place], [place] [address]    Report Status 12/01/2018 FINAL  Final     Studies: Dg Chest 2 View  Result Date: 11/29/2018 CLINICAL DATA:  Fever.  Chemotherapy. EXAM: CHEST - 2 VIEW COMPARISON:  06/14/2018 FINDINGS: Mild patchy bibasilar airspace disease has developed since the prior study. Possible pneumonia given the symptoms. Heart size normal. Negative for heart failure or effusion. Port-A-Cath tip in the SVC Multiple chronic rib fractures on the right. Chronic fracture right humerus and right clavicle. IMPRESSION: Mild bibasilar airspace disease, possible pneumonia. Electronically Signed   By: Franchot Gallo M.D.   On: 11/29/2018 11:28   US Renal  Result Date: 11/30/2018 CLINICAL DATA:  72 year old male with acute renal insufficiency. Metastatic breast cancer. EXAM: RENAL / URINARY TRACT ULTRASOUND COMPLETE COMPARISON:  PET-CT 12/31/2016. FINDINGS: Right Kidney: Renal measurements: 10.9 x 5.5 x 5.9 centimeters = volume: 184 mL. Small simple appearing cortical cyst measuring 14 millimeters. Echogenicity within normal limits. No solid mass or hydronephrosis visualized. Left Kidney: Renal measurements: 13.2 x 6.3 x 6.7 centimeters = volume: 288 mL. Simple appearing upper pole cyst measuring 4.5 centimeters redemonstrated. Echogenicity within normal limits. No solid mass or hydronephrosis visualized. Bladder: Appears normal for degree of bladder distention. Both ureteral jets detected with Doppler. IMPRESSION: No acute renal findings.  Simple renal cysts. Electronically Signed   By: Genevie Ann M.D.   On: 11/30/2018 17:31     Scheduled Meds: . acyclovir  800 mg Oral BID  . calcium-vitamin D  2 tablet Oral Daily  . doxycycline  100 mg Oral Q12H  . folic acid  1 mg Oral Daily  . gabapentin  300 mg Oral BID  . ipratropium-albuterol  3 mL Nebulization BID  . loratadine  10 mg Oral Daily  . multivitamin with  minerals  1 tablet Oral Daily  . pantoprazole  40 mg Oral QPM  . saccharomyces boulardii  250 mg Oral BID  . tamoxifen  20 mg Oral Daily   Continuous Infusions: . sodium chloride 125 mL/hr at 12/01/18 0415  .  sodium chloride 1,000 mL (11/29/18 1019)  . ceFEPime (MAXIPIME) IV 2 g (11/30/18 1115)  . sodium chloride      Principal Problem:   Sepsis due to pneumonia [facility]) Active Problems:   GERD   Bone metastases ([name]   Multiple myeloma ([name]   Multiple myeloma not having achieved remission ([name]   Essential hypertension   HCAP (healthcare-associated pneumonia)   Immunocompromised ([name]   Fever and chills   Generalized weakness   Peripheral neuropathy   Dehydration   Anemia due to chemotherapy   AKI (acute kidney injury) ([place])   Hypotension   Parainfluenza infection  Critical Care Time spent: 31 minutes    Irwin Brakeman, MD, FAAFP [facility] Pager [phone]  If 7PM-7AM, please contact night-coverage [email or web address] Password [email or web address] 12/01/2018, 8:24 AM    LOS: 2 days

## 2018-12-01 NOTE — Final Consult Note (Signed)
[facility] Consultation Oncology  Name: Johnny Lucas      MRN: [id number]    Location: IC01/IC01-01  Date: 12/01/2018 Time:9:20 PM   REFERRING PHYSICIAN: Dr. Wynetta Emery  REASON FOR CONSULT: Severe thrombocytopenia   DIAGNOSIS: Multiple myeloma  HISTORY OF PRESENT ILLNESS: Johnny Lucas is a 72 year old very pleasant white male who is seen in consultation today for further work-up and management of severe thrombocytopenia.  He was admitted to the hospital 11/28/2018 with severe fever of 102.9.  He also had some vague cough.  He came in with a platelet count of 161.  Gradually dropped to 126 the next day 62 on 11/30/2018.  Today the platelet count came down to 21K.  He had episodes of nosebleed this morning.  Eyes any other bleeding.  He was started on cefepime after hospitalization.  Doxycycline was added yesterday.  This morning he did not feel well, but gradually felt better after 2 PM.  His breathing has been stable.  He has developed a good urine output since yesterday.  He is also afebrile since admission.  Denies any headaches or other pains.  PAST MEDICAL HISTORY:   Past Medical History:  Diagnosis Date  . Anxiety   . Bone metastases ([place]) 09/10/2016  . Breast cancer ([place]) 2011   Stave IV breast cancer; radiation and tamoxifen  . Breast cancer, male ([place])    Stave IV breast cancer; radiation and tamoxifen  Overview:  Left breast ca with mets bone  Overview:  METS TO BONE  . GERD (gastroesophageal reflux disease)   . Hypertension   . Macular degeneration   . Multiple myeloma ([place]) 02/18/2017  . Peripheral neuropathy     ALLERGIES: No Known Allergies    MEDICATIONS: I have reviewed the patient's current medications.     PAST SURGICAL HISTORY Past Surgical History:  Procedure Laterality Date  . BACK SURGERY    . HERNIA REPAIR    . PORTACATH PLACEMENT Left 06/10/2018   Procedure: INSERTION PORT-A-CATH;  Surgeon: Virl Cagey, MD;  Location: [facility];  Service: General;   Laterality: Left;  . right arm surgery      FAMILY HISTORY: Family History  Problem Relation Age of Onset  . Stroke Mother   . Cancer Maternal Aunt        cancer NOS; died in her 57s  . Lung cancer Maternal Uncle        smoker    SOCIAL HISTORY:  reports that he has never smoked. He has quit using smokeless tobacco. He reports that he drinks about 24.0 standard drinks of alcohol per week. He reports that he does not use drugs.  PERFORMANCE STATUS: The patient's performance status is 2 - Symptomatic, <50% confined to bed  PHYSICAL EXAM: Most Recent Vital Signs: Blood pressure (!) 157/84, pulse 95, temperature 99.6 F (37.6 C), temperature source Oral, resp. rate 18, height 5' 8" (1.727 m), weight 215 lb 13.3 oz (97.9 kg), SpO2 92 %. BP (!) 157/84 (BP Location: Right Arm)   Pulse 95   Temp 99.6 F (37.6 C) (Oral)   Resp 18   Ht 5' 8" (1.727 m)   Wt 215 lb 13.3 oz (97.9 kg)   SpO2 92%   BMI 32.82 kg/m  General appearance: alert and cooperative Throat: lips, mucosa, and tongue normal; teeth and gums normal Lungs: clear to auscultation bilaterally Heart: regular rate and rhythm Abdomen: soft, non-tender; bowel sounds normal; no masses,  no organomegaly Extremities: no edema, redness or  tenderness in the calves or thighs Skin: Skin color, texture, turgor normal. No rashes or lesions Neurologic: Grossly normal  LABORATORY DATA:  Results for orders placed or performed during the hospital encounter of 11/29/18 (from the past 48 hour(s))  CBC WITH DIFFERENTIAL     Status: Abnormal   Collection Time: 11/30/18  4:08 AM  Result Value Ref [place]   WBC 7.4 4.0 - 10.5 K/uL   RBC 2.50 (L) 4.22 - 5.81 MIL/uL   Hemoglobin 8.4 (L) 13.0 - 17.0 g/dL   HCT 27.1 (L) 39.0 - 52.0 %   MCV 108.4 (H) 80.0 - 100.0 fL   MCH 33.6 26.0 - 34.0 pg   MCHC 31.0 30.0 - 36.0 g/dL   RDW 14.3 11.5 - 15.5 %   Platelets 62 (L) 150 - 400 K/uL    Comment: PLATELET COUNT CONFIRMED BY SMEAR SPECIMEN CHECKED  FOR CLOTS Immature Platelet Fraction may be clinically indicated, consider ordering this additional test [id number]    nRBC 0.0 0.0 - 0.2 %   Neutrophils Relative % 82 %   Neutro Abs 6.0 1.7 - 7.7 K/uL   Lymphocytes Relative 11 %   Lymphs Abs 0.8 0.7 - 4.0 K/uL   Monocytes Relative 4 %   Monocytes Absolute 0.3 0.1 - 1.0 K/uL   Eosinophils Relative 2 %   Eosinophils Absolute 0.2 0.0 - 0.5 K/uL   Basophils Relative 0 %   Basophils Absolute 0.0 0.0 - 0.1 K/uL   Immature Granulocytes 1 %   Abs Immature Granulocytes 0.05 0.00 - 0.07 K/uL    Comment: Performed at [facility], [address] [place], [place] [address]  Comprehensive metabolic panel     Status: Abnormal   Collection Time: 11/30/18  4:08 AM  Result Value Ref [place]   Sodium 138 135 - 145 mmol/L   Potassium 3.9 3.5 - 5.1 mmol/L   Chloride 114 (H) 98 - 111 mmol/L   CO2 18 (L) 22 - 32 mmol/L   Glucose, Bld 96 70 - 99 mg/dL   BUN 29 (H) 8 - 23 mg/dL   Creatinine, Ser 2.55 (H) 0.61 - 1.24 mg/dL   Calcium 6.8 (L) 8.9 - 10.3 mg/dL   Total Protein 4.6 (L) 6.5 - 8.1 g/dL   Albumin 2.7 (L) 3.5 - 5.0 g/dL   AST 20 15 - 41 U/L   ALT 16 0 - 44 U/L   Alkaline Phosphatase 21 (L) 38 - 126 U/L   Total Bilirubin 0.3 0.3 - 1.2 mg/dL   GFR calc non Af Amer 24 (L) >60 mL/min   GFR calc Af Amer 28 (L) >60 mL/min   Anion gap 6 5 - 15    Comment: Performed at [facility], [address] [place], [place] [address]  Magnesium     Status: Abnormal   Collection Time: 11/30/18  4:08 AM  Result Value Ref [place]   Magnesium 1.6 (L) 1.7 - 2.4 mg/dL    Comment: Performed at [facility], [address] [place], [place] [address]  CBC WITH DIFFERENTIAL     Status: Abnormal   Collection Time: 12/01/18  3:55 AM  Result Value Ref [place]   WBC 5.5 4.0 - 10.5 K/uL   RBC 2.56 (L) 4.22 - 5.81 MIL/uL   Hemoglobin 8.7 (L) 13.0 - 17.0 g/dL   HCT 28.1 (L) 39.0 - 52.0 %   MCV 109.8 (H) 80.0 - 100.0 fL   MCH 34.0 26.0 - 34.0 pg   MCHC 31.0 30.0 -  36.0  g/dL   RDW 14.3 11.5 - 15.5 %   Platelets 21 (LL) 150 - 400 K/uL    Comment: REPEATED TO VERIFY SPECIMEN CHECKED FOR CLOTS Immature Platelet Fraction may be clinically indicated, consider ordering this additional test [id number] THIS CRITICAL RESULT HAS VERIFIED AND BEEN CALLED TO A AMBURN,RN _0  [id number] BY MARIE KELLY ON 12 05 2019 AT 0509, AND HAS BEEN READ BACK.     nRBC 0.0 0.0 - 0.2 %   Neutrophils Relative % 87 %   Neutro Abs 4.8 1.7 - 7.7 K/uL   Lymphocytes Relative 8 %   Lymphs Abs 0.5 (L) 0.7 - 4.0 K/uL   Monocytes Relative 4 %   Monocytes Absolute 0.2 0.1 - 1.0 K/uL   Eosinophils Relative 0 %   Eosinophils Absolute 0.0 0.0 - 0.5 K/uL   Basophils Relative 0 %   Basophils Absolute 0.0 0.0 - 0.1 K/uL   Immature Granulocytes 1 %   Abs Immature Granulocytes 0.04 0.00 - 0.07 K/uL    Comment: Performed at [facility], [address], [place], [place] [address]  Comprehensive metabolic panel     Status: Abnormal   Collection Time: 12/01/18  3:55 AM  Result Value Ref [place]   Sodium 139 135 - 145 mmol/L   Potassium 3.9 3.5 - 5.1 mmol/L   Chloride 118 (H) 98 - 111 mmol/L   CO2 16 (L) 22 - 32 mmol/L   Glucose, Bld 97 70 - 99 mg/dL   BUN 21 8 - 23 mg/dL   Creatinine, Ser 1.78 (H) 0.61 - 1.24 mg/dL   Calcium 7.2 (L) 8.9 - 10.3 mg/dL   Total Protein 4.8 (L) 6.5 - 8.1 g/dL   Albumin 2.8 (L) 3.5 - 5.0 g/dL   AST 23 15 - 41 U/L   ALT 15 0 - 44 U/L   Alkaline Phosphatase 23 (L) 38 - 126 U/L   Total Bilirubin 0.5 0.3 - 1.2 mg/dL   GFR calc non Af Amer 38 (L) >60 mL/min   GFR calc Af Amer 44 (L) >60 mL/min   Anion gap 5 5 - 15    Comment: Performed at [facility], [address] [place], [place] [address]  Magnesium     Status: None   Collection Time: 12/01/18  3:55 AM  Result Value Ref Range   Magnesium 2.1 1.7 - 2.4 mg/dL    Comment: Performed at [facility], [address], [place], [place] [address]  Protime-INR     Status: None   Collection Time: 12/01/18  6:12 AM   Result Value Ref Range   Prothrombin Time 14.1 11.4 - 15.2 seconds   INR 1.10     Comment: Performed at [facility], [address] [place], [place] [address]  APTT     Status: Abnormal   Collection Time: 12/01/18  6:12 AM  Result Value Ref Range   aPTT 37 (H) 24 - 36 seconds    Comment:        IF BASELINE aPTT IS ELEVATED, SUGGEST PATIENT RISK ASSESSMENT BE USED TO DETERMINE APPROPRIATE ANTICOAGULANT THERAPY. Performed at [facility], [address] [place], [place] [address]   Fibrinogen     Status: None   Collection Time: 12/01/18  6:12 AM  Result Value Ref Range   Fibrinogen 406 210 - 475 mg/dL    Comment: Performed at [facility], [address], [place], [place] [address]  D-dimer, quantitative (not at [facility])     Status: Abnormal  Collection Time: 12/01/18  6:12 AM  Result Value Ref Range   D-Dimer, Quant 1.12 (H) 0.00 - 0.50 ug/mL-FEU    Comment: (NOTE) At the manufacturer cut-off of 0.50 ug/mL FEU, this assay has been documented to exclude PE with a sensitivity and negative predictive value of 97 to 99%.  At this time, this assay has not been approved by the FDA to exclude DVT/VTE. Results should be correlated with clinical presentation. Performed at [facility], [address] [place], [place] [address]       RADIOGRAPHY: US Renal  Result Date: 11/30/2018 CLINICAL DATA:  72 year old male with acute renal insufficiency. Metastatic breast cancer. EXAM: RENAL / URINARY TRACT ULTRASOUND COMPLETE COMPARISON:  PET-CT 12/31/2016. FINDINGS: Right Kidney: Renal measurements: 10.9 x 5.5 x 5.9 centimeters = volume: 184 mL. Small simple appearing cortical cyst measuring 14 millimeters. Echogenicity within normal limits. No solid mass or hydronephrosis visualized. Left Kidney: Renal measurements: 13.2 x 6.3 x 6.7 centimeters = volume: 288 mL. Simple appearing upper pole cyst measuring 4.5 centimeters redemonstrated. Echogenicity within normal limits. No solid mass or  hydronephrosis visualized. Bladder: Appears normal for degree of bladder distention. Both ureteral jets detected with Doppler. IMPRESSION: No acute renal findings.  Simple renal cysts. Electronically Signed   By: Genevie Ann M.D.   On: 11/30/2018 17:31   Nm Pulmonary Perf And Vent  Result Date: 12/01/2018 CLINICAL DATA:  Suspected.  Positive D-dimer. EXAM: NUCLEAR MEDICINE VENTILATION - PERFUSION LUNG SCAN TECHNIQUE: Ventilation images were obtained in multiple projections using inhaled aerosol Tc-52mDTPA. Perfusion images were obtained in multiple projections after intravenous injection of Tc-928mAA. RADIOPHARMACEUTICALS:  31.0 mCi of Tc-9980mPA aerosol inhalation and 4.2 mCi Tc99m64m IV COMPARISON:  Chest x-ray 11/29/2018. FINDINGS: No ventilation perfusion mismatches noted to suggest pulmonary embolus. This is a low probability scan. IMPRESSION: Low probability for pulmonary embolus. Electronically Signed   By: ThomMarcello Mooresgister   On: 12/01/2018 13:45   Dg Chest Port 1 View  Result Date: 12/01/2018 CLINICAL DATA:  Pneumonia. EXAM: PORTABLE CHEST 1 VIEW COMPARISON:  Radiographs of November 29, 2018. PET scan of December 31, 2016. FINDINGS: The heart size and mediastinal contours are within normal limits. Left internal jugular Port-A-Cath is unchanged in position. No pneumothorax or pleural effusion is noted. Mild left perihilar subsegmental atelectasis is noted. Probable calcified granuloma is seen laterally in left lower lobe. Another nodular density seen over left lung base which may represent overlying nipple shadow. The visualized skeletal structures are unremarkable. IMPRESSION: Mild left perihilar subsegmental atelectasis is noted. Probable calcified granuloma seen in left lower lobe. Another nodular density is seen over left lung base which may represent overlying nipple shadow; repeat radiograph with nipple markers is recommended to rule out pulmonary nodule. Electronically Signed   By: JameMarijo ConceptionD.   On: 12/01/2018 13:57         ASSESSMENT and PLAN:   1.  Severe thrombocytopenia: - He came in with a normal platelet count.  Platelet count gradually dropped over the last couple of days.  This is highly likely related to antibiotics.  He was also diagnosed with parainfluenza virus.  The x-ray was questionable for pneumonia. - I would check PT, PTT, fibrinogen and LDH level.  There is no active bleeding at this time.  Last PTT was mildly elevated at 37 seconds. -I would recommend discontinuing cefepime and consider alternative antibiotic. -We will also review his peripheral smear.  2.  Multiple myeloma: -he has  received carfilzomib on 11/28/2018. - His fever is partly from high-dose carfilzomib.  We will consider dose reducing carfilzomib for next treatment.  All questions were answered. The patient knows to call the clinic with any problems, questions or concerns. We can certainly see the patient much sooner if necessary.    Derek Jack

## 2018-12-01 NOTE — Progress Notes (Signed)
Night shift stepdown coverage note.  I received an alert from the nursing staff in regards to the patient's platelet level.  They had consistently dropped daily due to multifactorial factors including, but may be not limited to sepsis, recent chemotherapy, enoxaparin use, hemodilution from IVF, etc.  Multifactorial thrombocytopenia Discontinue Lovenox and aspirin.   Check coagulation parameters, fibrinogen and d-dimer. Consider hematology consult later today.  Tennis Must, MD [phone]

## 2018-12-02 ENCOUNTER — Inpatient Hospital Stay (HOSPITAL_COMMUNITY): Payer: Medicare Other

## 2018-12-02 DIAGNOSIS — J189 Pneumonia, unspecified organism: Secondary | ICD-10-CM

## 2018-12-02 DIAGNOSIS — A419 Sepsis, unspecified organism: Principal | ICD-10-CM

## 2018-12-02 DIAGNOSIS — B348 Other viral infections of unspecified site: Secondary | ICD-10-CM

## 2018-12-02 LAB — CBC WITH DIFFERENTIAL/PLATELET
Band Neutrophils: 0 %
Basophils Absolute: 0 10*3/uL (ref 0.0–0.1)
Basophils Relative: 0 %
Blasts: 0 %
Eosinophils Absolute: 0 10*3/uL (ref 0.0–0.5)
Eosinophils Relative: 0 %
HCT: 26.7 % — ABNORMAL LOW (ref 39.0–52.0)
HEMOGLOBIN: 8.5 g/dL — AB (ref 13.0–17.0)
Lymphocytes Relative: 14 %
Lymphs Abs: 0.7 10*3/uL (ref 0.7–4.0)
MCH: 33.9 pg (ref 26.0–34.0)
MCHC: 31.8 g/dL (ref 30.0–36.0)
MCV: 106.4 fL — AB (ref 80.0–100.0)
Metamyelocytes Relative: 0 %
Monocytes Absolute: 0.5 10*3/uL (ref 0.1–1.0)
Monocytes Relative: 9 %
Myelocytes: 0 %
NEUTROS PCT: 77 %
NRBC: 0 /100{WBCs}
Neutro Abs: 4 10*3/uL (ref 1.7–7.7)
Other: 0 %
Platelets: 9 10*3/uL — CL (ref 150–400)
Promyelocytes Relative: 0 %
RBC: 2.51 MIL/uL — AB (ref 4.22–5.81)
RDW: 14.5 % (ref 11.5–15.5)
WBC: 5.2 10*3/uL (ref 4.0–10.5)
nRBC: 0 % (ref 0.0–0.2)

## 2018-12-02 LAB — COMPREHENSIVE METABOLIC PANEL
ALK PHOS: 23 U/L — AB (ref 38–126)
ALT: 18 U/L (ref 0–44)
AST: 45 U/L — ABNORMAL HIGH (ref 15–41)
Albumin: 2.9 g/dL — ABNORMAL LOW (ref 3.5–5.0)
Anion gap: 6 (ref 5–15)
BILIRUBIN TOTAL: 0.8 mg/dL (ref 0.3–1.2)
BUN: 21 mg/dL (ref 8–23)
CO2: 15 mmol/L — ABNORMAL LOW (ref 22–32)
Calcium: 7.5 mg/dL — ABNORMAL LOW (ref 8.9–10.3)
Chloride: 121 mmol/L — ABNORMAL HIGH (ref 98–111)
Creatinine, Ser: 1.82 mg/dL — ABNORMAL HIGH (ref 0.61–1.24)
GFR calc Af Amer: 43 mL/min — ABNORMAL LOW (ref >60)
GFR calc non Af Amer: 37 mL/min — ABNORMAL LOW (ref >60)
Glucose, Bld: 110 mg/dL — ABNORMAL HIGH (ref 70–99)
Potassium: 3.6 mmol/L (ref 3.5–5.1)
Sodium: 142 mmol/L (ref 135–145)
TOTAL PROTEIN: 4.8 g/dL — AB (ref 6.5–8.1)

## 2018-12-02 LAB — PROTIME-INR
INR: 1.09
Prothrombin Time: 14 seconds (ref 11.4–15.2)

## 2018-12-02 LAB — LACTATE DEHYDROGENASE: LDH: 443 U/L — ABNORMAL HIGH (ref 98–192)

## 2018-12-02 LAB — SAVE SMEAR(SSMR), FOR PROVIDER SLIDE REVIEW

## 2018-12-02 LAB — FIBRINOGEN: Fibrinogen: 424 mg/dL (ref 210–475)

## 2018-12-02 LAB — APTT: aPTT: 33 seconds (ref 24–36)

## 2018-12-02 LAB — IMMATURE PLATELET FRACTION: Immature Platelet Fraction: 10.9 % — ABNORMAL HIGH (ref 1.2–8.6)

## 2018-12-02 MED ORDER — AMLODIPINE BESYLATE 5 MG PO TABS
5.0000 mg | ORAL_TABLET | Freq: Every day | ORAL | Status: DC
  Administered 2018-12-02 – 2018-12-05 (×4): 5 mg via ORAL
  Filled 2018-12-02 (×5): qty 1

## 2018-12-02 MED ORDER — SODIUM BICARBONATE 650 MG PO TABS
650.0000 mg | ORAL_TABLET | Freq: Two times a day (BID) | ORAL | Status: AC
  Administered 2018-12-02 – 2018-12-03 (×4): 650 mg via ORAL
  Filled 2018-12-02 (×4): qty 1

## 2018-12-02 MED ORDER — METOPROLOL SUCCINATE ER 50 MG PO TB24
50.0000 mg | ORAL_TABLET | Freq: Every day | ORAL | Status: DC
  Administered 2018-12-02 – 2018-12-05 (×4): 50 mg via ORAL
  Filled 2018-12-02 (×4): qty 1

## 2018-12-02 MED ORDER — HYDRALAZINE HCL 25 MG PO TABS
50.0000 mg | ORAL_TABLET | Freq: Three times a day (TID) | ORAL | Status: DC
  Administered 2018-12-03 – 2018-12-05 (×7): 50 mg via ORAL
  Filled 2018-12-02 (×8): qty 2

## 2018-12-02 MED ORDER — HYDRALAZINE HCL 25 MG PO TABS
25.0000 mg | ORAL_TABLET | Freq: Three times a day (TID) | ORAL | Status: DC
  Administered 2018-12-02 (×2): 25 mg via ORAL
  Filled 2018-12-02 (×2): qty 1

## 2018-12-02 MED ORDER — SODIUM CHLORIDE 0.9 % IV SOLN
1000.0000 mL | INTRAVENOUS | Status: DC
  Administered 2018-12-02: 1000 mL via INTRAVENOUS

## 2018-12-02 MED ORDER — IMMUNE GLOBULIN (HUMAN) 20 GM/200ML IV SOLN
1.0000 g/kg | Freq: Once | INTRAVENOUS | Status: AC
  Administered 2018-12-02: 100 g via INTRAVENOUS
  Filled 2018-12-02: qty 1000

## 2018-12-02 MED ORDER — GUAIFENESIN-DM 100-10 MG/5ML PO SYRP
5.0000 mL | ORAL_SOLUTION | ORAL | Status: DC | PRN
  Administered 2018-12-02 – 2018-12-05 (×12): 5 mL via ORAL
  Filled 2018-12-02 (×12): qty 5

## 2018-12-02 NOTE — Progress Notes (Addendum)
PROGRESS NOTE  Johnny Lucas  PHX:[id number]  DOB: 1946-05-13  DOA: 11/29/2018 PCP: [name], MD  Brief Admission Hx: 72 y.o. male who is currently undergoing chemotherapy for multiple myeloma and recently had a infusion done yesterday at the cancer center reportedly has had fever, cough congestion and left eye redness for the past couple of days.  His temperature was up to 102.   He was admitted with pneumonia.    MDM/Assessment & Plan:   1. Sepsis secondary to pneumonia -this patient is immunocompromised and has recently received chemotherapy treatment yesterday.  He has been admitted for broad-spectrum IV antibiotic therapy.  Continue supportive care.  Sepsis protocol started and code sepsis was called.  He is clinically better and antibiotics have been discontinued.   2. Parainfluenza virus - tested positive, treating supportively.  3. Thrombocytopenia - acute drop in platelets, small amount nose bleed, recently had chemo treatment, Consulted hem/onc.  I spoke with Dr. Delton Coombes today and he recommended giving IVIG 1gm/kg x 1 dose and repeat CBC tomorrow.  Call him tomorrow with results to discuss if a repeat IVIG treatment will be needed on 12/7.     4. Elevated D dimer - Pt continues to have SOB, V/Q scan negative for PE.  5. HCAP-clinically he is improved, he was treated with cefepime and vancomycin discontinued as MRSA screen negative. De-escalated antibiotics. 6. Acute kidney injury- creatinine is improving with hydration, likely was secondary to dehydration.  Recheck in AM.  7. Loose stools - likely from antibiotics and recent chemo treatments.  Treat supportively. If persists, do further testing.  Add probiotics.  8. Generalized weakness-He reports feeling better today.  Multifactorial given recent chemotherapy treatment, dehydration and anemia, treated supportively.  PT evaluation ordered and requested.  9. Hypotension - Resolved now.  10. Essential hypertension - suboptimally  controlled, resumed home blood pressure medications, added amlodipine, hydralazine.    11. Metabolic acidosis - sodium bicarbonate tabs added x 2 days.  12. Dehydration-treating with IV fluid hydration. 13. Anemia due to chemotherapy-follow CBC closely. 14. Multiple myeloma-currently being treated with chemotherapy.  Follow-up with oncology team after discharge.  I have consulted inpatient oncology team to see patient.   DVT Prophylaxis: Lovenox Code Status: Full Family Communication: Wife at bedside  Antimicrobials:  Vancomycin 12/3-12/4  Cefepime 12/3 >12/5  Doxycycline 12/5 - 12/6  Subjective: Patient says that he is feeling a little better today.  He did have a small nosebleed this morning but it has completely stopped.  Objective: Vitals:   12/01/18 2310 12/02/18 0000 12/02/18 0646 12/02/18 0750  BP: (!) 170/96 (!) 184/97 (!) 184/103 (!) 165/93  Pulse:   98 98  Resp:    20  Temp:  98.3 F (36.8 C) 99.1 F (37.3 C) 98.4 F (36.9 C)  TempSrc:  Oral Oral Oral  SpO2:   96% 94%  Weight:      Height:        Intake/Output Summary (Last 24 hours) at 12/02/2018 1313 Last data filed at 12/02/2018 0945 Gross per 24 hour  Intake 1180.99 ml  Output -  Net 1180.99 ml   Filed Weights   11/29/18 0947 11/29/18 1331 12/01/18 0437  Weight: 92.1 kg 96.4 kg 97.9 kg   REVIEW OF SYSTEMS  As per history otherwise all reviewed and reported negative  Exam:  General exam: awake, alert, cooperative, NAD.  Respiratory system: rales heard, some exp wheezing heard on left,  No increased work of breathing. Cardiovascular system: S1 &  S2 heard, RRR. No JVD, murmurs, gallops, clicks or pedal edema. Gastrointestinal system: Abdomen is nondistended, soft and nontender. Normal bowel sounds heard. Central nervous system: Alert and oriented. No focal neurological deficits. Extremities: no cyanosis or clubbing.  Data Reviewed: Basic Metabolic Panel: Recent Labs  Lab 11/28/18 0832  11/29/18 0959 11/30/18 0408 12/01/18 0355 12/02/18 0407  NA 138 136 138 139 142  K 4.0 3.3* 3.9 3.9 3.6  CL 106 105 114* 118* 121*  CO2 26 21* 18* 16* 15*  GLUCOSE 107* 117* 96 97 110*  BUN 18 31* 29* 21 21  CREATININE 1.58* 2.78* 2.55* 1.78* 1.82*  CALCIUM 9.1 8.6* 6.8* 7.2* 7.5*  MG  --   --  1.6* 2.1  --    Liver Function Tests: Recent Labs  Lab 11/28/18 0832 11/29/18 0959 11/30/18 0408 12/01/18 0355 12/02/18 0407  AST 19 49* 20 23 45*  ALT _0 ALKPHOS 26* 26* 21* 23* 23*  BILITOT 0.6 0.4 0.3 0.5 0.8  PROT 6.5 5.6* 4.6* 4.8* 4.8*  ALBUMIN 4.1 3.5 2.7* 2.8* 2.9*   No results for input(s): LIPASE, AMYLASE in the last 168 hours. No results for input(s): AMMONIA in the last 168 hours. CBC: Recent Labs  Lab 11/28/18 0832 11/29/18 0959 11/30/18 0408 12/01/18 0355 12/02/18 0407  WBC 2.8* 7.4 7.4 5.5 5.2  NEUTROABS 1.5* 6.5 6.0 4.8 4.0  HGB 11.3* 9.7* 8.4* 8.7* 8.5*  HCT 35.3* 29.9* 27.1* 28.1* 26.7*  MCV 109.6* 107.6* 108.4* 109.8* 106.4*  PLT 161 126* 62* 21* 9*   Cardiac Enzymes: Recent Labs  Lab 11/29/18 0959  TROPONINI 0.03*   CBG (last 3)  No results for input(s): GLUCAP in the last 72 hours. Recent Results (from the past 240 hour(s))  Culture, blood (Routine x 2)     Status: None (Preliminary result)   Collection Time: 11/29/18  9:59 AM  Result Value Ref Range Status   Specimen Description BLOOD RIGHT ARM DRAWN BY RN  Final   Special Requests   Final    BOTTLES DRAWN AEROBIC AND ANAEROBIC Blood Culture adequate volume   Culture   Final    NO GROWTH 3 DAYS Performed at [facility], [address], [place], [place] [address]    Report Status PENDING  Incomplete  Culture, blood (Routine x 2)     Status: None (Preliminary result)   Collection Time: 11/29/18 10:11 AM  Result Value Ref Range Status   Specimen Description BLOOD DRAWN BY RN PORTA CATH  Final   Special Requests   Final    BOTTLES DRAWN AEROBIC AND ANAEROBIC Blood Culture  adequate volume   Culture   Final    NO GROWTH 3 DAYS Performed at [facility], [address], [place], [place] [address]    Report Status PENDING  Incomplete  Group A Strep by PCR     Status: None   Collection Time: 11/29/18 10:23 AM  Result Value Ref Range Status   Group A Strep by PCR NOT DETECTED NOT DETECTED Final    Comment: Performed at [facility], [address] [place],  [address]  Respiratory Panel by PCR     Status: Abnormal   Collection Time: 11/29/18 10:23 AM  Result Value Ref Range Status   Adenovirus NOT DETECTED NOT DETECTED Final   Coronavirus 229E NOT DETECTED NOT DETECTED Final   Coronavirus HKU1 NOT DETECTED NOT DETECTED Final   Coronavirus NL63 NOT DETECTED NOT DETECTED Final  Coronavirus OC43 NOT DETECTED NOT DETECTED Final   Metapneumovirus NOT DETECTED NOT DETECTED Final   Rhinovirus / Enterovirus NOT DETECTED NOT DETECTED Final   Influenza A NOT DETECTED NOT DETECTED Final   Influenza A H1 NOT DETECTED NOT DETECTED Final   Influenza A H1 2009 NOT DETECTED NOT DETECTED Final   Influenza A H3 NOT DETECTED NOT DETECTED Final   Influenza B NOT DETECTED NOT DETECTED Final   Parainfluenza Virus 1 DETECTED (A) NOT DETECTED Final   Parainfluenza Virus 2 NOT DETECTED NOT DETECTED Final   Parainfluenza Virus 3 NOT DETECTED NOT DETECTED Final   Parainfluenza Virus 4 NOT DETECTED NOT DETECTED Final   Respiratory Syncytial Virus NOT DETECTED NOT DETECTED Final   Bordetella pertussis NOT DETECTED NOT DETECTED Final   Chlamydophila pneumoniae NOT DETECTED NOT DETECTED Final   Mycoplasma pneumoniae NOT DETECTED NOT DETECTED Final  MRSA PCR Screening     Status: None   Collection Time: 11/29/18  3:15 PM  Result Value Ref Range Status   MRSA by PCR NEGATIVE NEGATIVE Final    Comment:        The GeneXpert MRSA Assay (FDA approved for NASAL specimens only), is one component of a comprehensive MRSA colonization surveillance program. It is not intended to  diagnose MRSA infection nor to guide or monitor treatment for MRSA infections. Performed at [facility], [address] [place], [place] [address]   Urine culture     Status: None   Collection Time: 11/29/18  3:24 PM  Result Value Ref Range Status   Specimen Description   Final    URINE, RANDOM Performed at [facility], [address] [place], [place] [address]    Special Requests   Final    NONE Performed at [facility], [address], [place], [place] [address]    Culture   Final    NO GROWTH Performed at [facility], [place] [address], [place], [place] [address]    Report Status 12/01/2018 FINAL  Final     Studies: US Renal  Result Date: 11/30/2018 CLINICAL DATA:  72 year old male with acute renal insufficiency. Metastatic breast cancer. EXAM: RENAL / URINARY TRACT ULTRASOUND COMPLETE COMPARISON:  PET-CT 12/31/2016. FINDINGS: Right Kidney: Renal measurements: 10.9 x 5.5 x 5.9 centimeters = volume: 184 mL. Small simple appearing cortical cyst measuring 14 millimeters. Echogenicity within normal limits. No solid mass or hydronephrosis visualized. Left Kidney: Renal measurements: 13.2 x 6.3 x 6.7 centimeters = volume: 288 mL. Simple appearing upper pole cyst measuring 4.5 centimeters redemonstrated. Echogenicity within normal limits. No solid mass or hydronephrosis visualized. Bladder: Appears normal for degree of bladder distention. Both ureteral jets detected with Doppler. IMPRESSION: No acute renal findings.  Simple renal cysts. Electronically Signed   By: Genevie Ann M.D.   On: 11/30/2018 17:31   Nm Pulmonary Perf And Vent  Result Date: 12/01/2018 CLINICAL DATA:  Suspected.  Positive D-dimer. EXAM: NUCLEAR MEDICINE VENTILATION - PERFUSION LUNG SCAN TECHNIQUE: Ventilation images were obtained in multiple projections using inhaled aerosol Tc-53mDTPA. Perfusion images were obtained in multiple projections after intravenous injection of Tc-980mAA. RADIOPHARMACEUTICALS:  31.0 mCi  of Tc-9947mPA aerosol inhalation and 4.2 mCi Tc99m28m IV COMPARISON:  Chest x-ray 11/29/2018. FINDINGS: No ventilation perfusion mismatches noted to suggest pulmonary embolus. This is a low probability scan. IMPRESSION: Low probability for pulmonary embolus. Electronically Signed   By: ThomMarcello Mooresgister   On: 12/01/2018 13:45   Dg Chest Port 1 View[address]  Result Date: 12/02/2018 CLINICAL DATA:  Cough and fever EXAM: PORTABLE CHEST 1 VIEW COMPARISON:  12/01/2018 FINDINGS: Cardiac shadow remains mildly enlarged. Left chest wall port is again noted and stable. Postsurgical changes are seen. Old right clavicular fracture is noted. The lungs are well aerated bilaterally. Previously seen nodular densities on the left are not as well appreciated on today's exam and were likely related to nipple shadows. The markers appear extrinsic to the chest wall on the left on today's image. No new focal abnormality is noted. IMPRESSION: No focal infiltrate is seen. Previously seen nodular changes on the left are not well appreciated on today's exam likely related to nipple shadow. Electronically Signed   By: Inez Catalina M.D.   On: 12/02/2018 06:34   Dg Chest Port 1 View  Result Date: 12/01/2018 CLINICAL DATA:  Pneumonia. EXAM: PORTABLE CHEST 1 VIEW COMPARISON:  Radiographs of November 29, 2018. PET scan of December 31, 2016. FINDINGS: The heart size and mediastinal contours are within normal limits. Left internal jugular Port-A-Cath is unchanged in position. No pneumothorax or pleural effusion is noted. Mild left perihilar subsegmental atelectasis is noted. Probable calcified granuloma is seen laterally in left lower lobe. Another nodular density seen over left lung base which may represent overlying nipple shadow. The visualized skeletal structures are unremarkable. IMPRESSION: Mild left perihilar subsegmental atelectasis is noted. Probable calcified granuloma seen in left lower lobe. Another nodular density is seen over left lung  base which may represent overlying nipple shadow; repeat radiograph with nipple markers is recommended to rule out pulmonary nodule. Electronically Signed   By: Marijo Conception, M.D.   On: 12/01/2018 13:57     Scheduled Meds: . acyclovir  800 mg Oral BID  . amLODipine  5 mg Oral Daily  . calcium-vitamin D  2 tablet Oral Daily  . folic acid  1 mg Oral Daily  . gabapentin  300 mg Oral BID  . ipratropium-albuterol  3 mL Nebulization BID  . loratadine  10 mg Oral Daily  . metoprolol succinate  50 mg Oral Daily  . multivitamin with minerals  1 tablet Oral Daily  . pantoprazole  40 mg Oral QPM  . saccharomyces boulardii  250 mg Oral BID  . sodium bicarbonate  650 mg Oral BID  . tamoxifen  20 mg Oral Daily   Continuous Infusions: . sodium chloride 1,000 mL (11/29/18 1019)  . sodium chloride 1,000 mL (12/02/18 0903)  . Immune Globulin 10%    . sodium chloride      Principal Problem:   Sepsis due to pneumonia [facility]) Active Problems:   GERD   Bone metastases ([name]   Multiple myeloma ([name]   Multiple myeloma not having achieved remission ([name]   Essential hypertension   HCAP (healthcare-associated pneumonia)   Immunocompromised ([name]   Fever and chills   Generalized weakness   Peripheral neuropathy   Dehydration   Anemia due to chemotherapy   AKI (acute kidney injury) ([place])   Hypotension   Parainfluenza infection  Time spent: 25 minutes    Irwin Brakeman, MD, FAAFP [facility] Pager [phone]  If 7PM-7AM, please contact night-coverage [email or web address] Password [email or web address] 12/02/2018, 1:13 PM    LOS: 3 days

## 2018-12-02 NOTE — Progress Notes (Signed)
IVIG administered at 235 mL/hr. Patient denies discomfort or reaction during administration. Vital signs are as follows     12/02/18 1821  Vitals  Temp 98.3 F (36.8 C)  Temp Source Oral  BP (!) 170/92  BP Location Right Arm  BP Method Automatic  Patient Position (if appropriate) Sitting  Pulse Rate 79  Pulse Rate Source Dinamap  Resp 18  Oxygen Therapy  SpO2 100 %  O2 Device Nasal Cannula  O2 Flow Rate (L/min) 2 L/min

## 2018-12-02 NOTE — Progress Notes (Signed)
Platelet count 9 this morning.  Patient is not having nosebleed or other signs of active bleeding at this time. Lovenox was previously discontinued.  Patient was seen by oncology on 12/5 and his thrombocytopenia was thought to be related to antibiotics.  Cefepime was discontinued.  LDH elevated at 443. -Save smear, fibrinogen, APTT, PT-INR pending -Check immature platelet fraction

## 2018-12-02 NOTE — Progress Notes (Signed)
Dr. Delton Coombes Hem/Onc MD, has spoken with patient and his family and they are agreeable to IVIG administration.

## 2018-12-02 NOTE — Progress Notes (Signed)
Patient receiving IVIG patient denies any discomfort or distress at this time. No reaction observed and vital signs are as follows     12/02/18 1836  Vitals  Temp 98.9 F (37.2 C)  Temp Source Oral  BP (!) 165/99  BP Location Right Arm  BP Method Automatic  Patient Position (if appropriate) Sitting  Pulse Rate 76  Pulse Rate Source Dinamap  Resp 18  Oxygen Therapy  SpO2 100 %  O2 Device Nasal Cannula  O2 Flow Rate (L/min) 2 L/min

## 2018-12-02 NOTE — Progress Notes (Signed)
CRITICAL VALUE ALERT  Critical Value:  Platelets 9  Date & Time Notied:  0605 12/02/18  Provider Notified: Ninetta Lights  Orders Received/Actions taken:

## 2018-12-02 NOTE — Progress Notes (Signed)
IVIG administered at rate 58.7. Patient denies any discomfort or reaction. Vitals are as follows     12/02/18 1751  Vitals  Temp 98.1 F (36.7 C)  Temp Source Oral  BP (!) 152/94  BP Location Right Arm  BP Method Automatic  Patient Position (if appropriate) Sitting  Pulse Rate 80  Pulse Rate Source Dinamap  Resp 18  Oxygen Therapy  SpO2 100 %  O2 Device Nasal Cannula  O2 Flow Rate (L/min) 2 L/min   Continuing to monitor patient.

## 2018-12-02 NOTE — Progress Notes (Signed)
Critical lab value platelets 9. Called by lab at 0527. This RN reported value to the patient's RN S.Alveta Heimlich, RN at [phone]. Pt is currently being transferred upstairs to a Med/Surg bed.

## 2018-12-02 NOTE — Progress Notes (Signed)
CRITICAL VALUE ALERT  Critical Value:  Platelets 8  Date & Time Notied:  12/02/18 0700  Provider Notified: Wynetta Emery  Orders Received/Actions taken:

## 2018-12-02 NOTE — Progress Notes (Signed)
IVIG administered at rate 118 mL/hr. Patient denies any discomfort or reaction. Vitals are as follows     12/02/18 1806  Vitals  Temp 98.5 F (36.9 C)  Temp Source Oral  BP (!) 155/90  BP Location Right Arm  BP Method Automatic  Patient Position (if appropriate) Sitting  Pulse Rate 83  Pulse Rate Source Dinamap  Resp 18  Oxygen Therapy  SpO2 100 %  O2 Device Nasal Cannula  O2 Flow Rate (L/min) 2 L/min   Continuing to monitor patient

## 2018-12-02 NOTE — Consult Note (Signed)
[facility] Oncology Progress Note  Name: Johnny Lucas      MRN: [id number]    Location: Z329/J242-68  Date: 12/02/2018 Time:4:36 PM   Subjective: Interval History:Johnny Lucas is feeling about the same today.  He has some shortness of breath on exertion.  He had one minor nosebleed when he blew his nose this morning.  Otherwise no other bleeding was reported.  He remains afebrile.  Objective: Vital signs in last 24 hours: Temp:  [98.3 F (36.8 C)-99.6 F (37.6 C)] 98.9 F (37.2 C) (12/06 1537) Pulse Rate:  [79-98] 79 (12/06 1537) Resp:  [18-20] 18 (12/06 1537) BP: (149-184)/(93-103) 157/96 (12/06 1537) SpO2:  [92 %-99 %] 99 % (12/06 1537)    Intake/Output from previous day: 12/05 0800 - 12/06 0759 In: 1761.7 [I.V.:1661.7] Out: 350 [Urine:350]    Intake/Output this shift: Total I/O In: 477.7 [P.O.:240; I.V.:237.7] Out: -    PHYSICAL EXAM: BP (!) 157/96 (BP Location: Right Arm)   Pulse 79   Temp 98.9 F (37.2 C) (Oral)   Resp 18   Ht _0  (1.727 m)   Wt 215 lb 13.3 oz (97.9 kg)   SpO2 99%   BMI 32.82 kg/m  General appearance: alert and cooperative Eyes: conjunctivae/corneas clear. PERRL, EOM's intact. Fundi benign., Left subconjunctival hemorrhage is improving. Skin: Skin color, texture, turgor normal. No rashes or lesions Neurologic: Grossly normal   Studies/Results: Results for orders placed or performed during the hospital encounter of 11/29/18 (from the past 48 hour(s))  CBC WITH DIFFERENTIAL     Status: Abnormal   Collection Time: 12/01/18  3:55 AM  Result Value Ref Range   WBC 5.5 4.0 - 10.5 K/uL   RBC 2.56 (L) 4.22 - 5.81 MIL/uL   Hemoglobin 8.7 (L) 13.0 - 17.0 g/dL   HCT 28.1 (L) 39.0 - 52.0 %   MCV 109.8 (H) 80.0 - 100.0 fL   MCH 34.0 26.0 - 34.0 pg   MCHC 31.0 30.0 - 36.0 g/dL   RDW 14.3 11.5 - 15.5 %   Platelets 21 (LL) 150 - 400 K/uL    Comment: REPEATED TO VERIFY SPECIMEN CHECKED FOR CLOTS Immature Platelet Fraction may  be clinically indicated, consider ordering this additional test [id number] THIS CRITICAL RESULT HAS VERIFIED AND BEEN CALLED TO A AMBURN,RN _1  [id number] BY MARIE KELLY ON 12 05 2019 AT 0509, AND HAS BEEN READ BACK.     nRBC 0.0 0.0 - 0.2 %   Neutrophils Relative % 87 %   Neutro Abs 4.8 1.7 - 7.7 K/uL   Lymphocytes Relative 8 %   Lymphs Abs 0.5 (L) 0.7 - 4.0 K/uL   Monocytes Relative 4 %   Monocytes Absolute 0.2 0.1 - 1.0 K/uL   Eosinophils Relative 0 %   Eosinophils Absolute 0.0 0.0 - 0.5 K/uL   Basophils Relative 0 %   Basophils Absolute 0.0 0.0 - 0.1 K/uL   Immature Granulocytes 1 %   Abs Immature Granulocytes 0.04 0.00 - 0.07 K/uL    Comment: Performed at [facility], [address], [place], [place] [address]  Comprehensive metabolic panel     Status: Abnormal   Collection Time: 12/01/18  3:55 AM  Result Value Ref Range   Sodium 139 135 - 145 mmol/L   Potassium 3.9 3.5 - 5.1 mmol/L   Chloride 118 (H) 98 - 111 mmol/L   CO2 16 (L) 22 - 32 mmol/L   Glucose, Bld 97 70 - 99 mg/dL   BUN  21 8 - 23 mg/dL   Creatinine, Ser 1.78 (H) 0.61 - 1.24 mg/dL   Calcium 7.2 (L) 8.9 - 10.3 mg/dL   Total Protein 4.8 (L) 6.5 - 8.1 g/dL   Albumin 2.8 (L) 3.5 - 5.0 g/dL   AST 23 15 - 41 U/L   ALT 15 0 - 44 U/L   Alkaline Phosphatase 23 (L) 38 - 126 U/L   Total Bilirubin 0.5 0.3 - 1.2 mg/dL   GFR calc non Af Amer 38 (L) >60 mL/min   GFR calc Af Amer 44 (L) >60 mL/min   Anion gap 5 5 - 15    Comment: Performed at [facility], [address] [place], [place] [address]  Magnesium     Status: None   Collection Time: 12/01/18  3:55 AM  Result Value Ref Range   Magnesium 2.1 1.7 - 2.4 mg/dL    Comment: Performed at [facility], [address] [place], [place] [address]  Protime-INR     Status: None   Collection Time: 12/01/18  6:12 AM  Result Value Ref Range   Prothrombin Time 14.1 11.4 - 15.2 seconds   INR 1.10     Comment: Performed at [facility], [address] [place], [place] [address]   APTT     Status: Abnormal   Collection Time: 12/01/18  6:12 AM  Result Value Ref Range   aPTT 37 (H) 24 - 36 seconds    Comment:        IF BASELINE aPTT IS ELEVATED, SUGGEST PATIENT RISK ASSESSMENT BE USED TO DETERMINE APPROPRIATE ANTICOAGULANT THERAPY. Performed at [facility], [address] [place], [place] [address]   Fibrinogen     Status: None   Collection Time: 12/01/18  6:12 AM  Result Value Ref Range   Fibrinogen 406 210 - 475 mg/dL    Comment: Performed at [facility], [address], [place], [place] [address]  D-dimer, quantitative (not at [facility])     Status: Abnormal   Collection Time: 12/01/18  6:12 AM  Result Value Ref Range   D-Dimer, Quant 1.12 (H) 0.00 - 0.50 ug/mL-FEU    Comment: (NOTE) At the manufacturer cut-off of 0.50 ug/mL FEU, this assay has been documented to exclude PE with a sensitivity and negative predictive value of 97 to 99%.  At this time, this assay has not been approved by the FDA to exclude DVT/VTE. Results should be correlated with clinical presentation. Performed at [facility], [address], [place], [place] [address]   CBC WITH DIFFERENTIAL     Status: Abnormal   Collection Time: 12/02/18  4:07 AM  Result Value Ref Range   WBC 5.2 4.0 - 10.5 K/uL   RBC 2.51 (L) 4.22 - 5.81 MIL/uL   Hemoglobin 8.5 (L) 13.0 - 17.0 g/dL   HCT 26.7 (L) 39.0 - 52.0 %   MCV 106.4 (H) 80.0 - 100.0 fL   MCH 33.9 26.0 - 34.0 pg   MCHC 31.8 30.0 - 36.0 g/dL   RDW 14.5 11.5 - 15.5 %   Platelets 9 (LL) 150 - 400 K/uL    Comment: SPECIMEN CHECKED FOR CLOTS Immature Platelet Fraction may be clinically indicated, consider ordering this additional test [id number] CRITICAL VALUE NOTED.  VALUE IS CONSISTENT WITH PREVIOUSLY REPORTED AND CALLED VALUE. THIS CRITICAL RESULT HAS VERIFIED AND BEEN CALLED TO R WAGONER,RN BY MARIE KELLY ON 12 06 2019 AT 0527, AND HAS BEEN READ BACK.     nRBC 0.0 0.0 - 0.2 %  Smear Review SMEAR STAINED AND AVAILABLE FOR REVIEW     Neutrophils Relative % 77 %   Lymphocytes Relative 14 %   Monocytes Relative 9 %   Eosinophils Relative 0 %   Basophils Relative 0 %   Band Neutrophils 0 %   Metamyelocytes Relative 0 %   Myelocytes 0 %   Promyelocytes Relative 0 %   Blasts 0 %   nRBC 0 0 /100 WBC   Other 0 %   Neutro Abs 4.0 1.7 - 7.7 K/uL   Lymphs Abs 0.7 0.7 - 4.0 K/uL   Monocytes Absolute 0.5 0.1 - 1.0 K/uL   Eosinophils Absolute 0.0 0.0 - 0.5 K/uL   Basophils Absolute 0.0 0.0 - 0.1 K/uL    Comment: Performed at [facility], [address], [place], [place] [address]  Comprehensive metabolic panel     Status: Abnormal   Collection Time: 12/02/18  4:07 AM  Result Value Ref Range   Sodium 142 135 - 145 mmol/L   Potassium 3.6 3.5 - 5.1 mmol/L   Chloride 121 (H) 98 - 111 mmol/L   CO2 15 (L) 22 - 32 mmol/L   Glucose, Bld 110 (H) 70 - 99 mg/dL   BUN 21 8 - 23 mg/dL   Creatinine, Ser 1.82 (H) 0.61 - 1.24 mg/dL   Calcium 7.5 (L) 8.9 - 10.3 mg/dL   Total Protein 4.8 (L) 6.5 - 8.1 g/dL   Albumin 2.9 (L) 3.5 - 5.0 g/dL   AST 45 (H) 15 - 41 U/L   ALT 18 0 - 44 U/L   Alkaline Phosphatase 23 (L) 38 - 126 U/L   Total Bilirubin 0.8 0.3 - 1.2 mg/dL   GFR calc non Af Amer 37 (L) >60 mL/min   GFR calc Af Amer 43 (L) >60 mL/min   Anion gap 6 5 - 15    Comment: Performed at [facility], [address] [place], [place] [address]  Protime-INR     Status: None   Collection Time: 12/02/18  4:07 AM  Result Value Ref Range   Prothrombin Time 14.0 11.4 - 15.2 seconds   INR 1.09     Comment: Performed at [facility], [address], [place], [place] [address]  APTT     Status: None   Collection Time: 12/02/18  4:07 AM  Result Value Ref Range   aPTT 33 24 - 36 seconds    Comment: Performed at [facility], [address], [place], [place] [address]  Fibrinogen     Status: None   Collection Time: 12/02/18  4:07 AM  Result Value Ref Range   Fibrinogen 424 210 - 475 mg/dL    Comment: Performed at [facility], [address], [place], [place] [address]  Lactate dehydrogenase     Status: Abnormal   Collection Time: 12/02/18  4:07 AM  Result Value Ref Range   LDH 443 (H) 98 - 192 U/L    Comment: Performed at [facility], [address], [place], [place] [address]  Save Smear     Status: None   Collection Time: 12/02/18  4:07 AM  Result Value Ref Range   Smear Review SMEAR STAINED AND AVAILABLE FOR REVIEW     Comment: Performed at [facility], [address], [place], [place] [address]  Immature Platelet Fraction     Status: Abnormal   Collection Time: 12/02/18  4:07 AM  Result Value Ref Range   Immature Platelet Fraction 10.9 (H) 1.2 - 8.6 %  Comment:        An elevated IPF indicates increased platelet production. A low platelet count with an elevated IPF may be associated with peripheral platelet destruction (e.g. DIC, ITP) or bone marrow recovery (e.g. after chemotherapy or transplant). A low platelet count with a low or non- elevated IPF is consistent with a platelet production disorder. Performed at [facility], [address] [place], [place] [address]    US Renal  Result Date: 11/30/2018 CLINICAL DATA:  72 year old male with acute renal insufficiency. Metastatic breast cancer. EXAM: RENAL / URINARY TRACT ULTRASOUND COMPLETE COMPARISON:  PET-CT 12/31/2016. FINDINGS: Right Kidney: Renal measurements: 10.9 x 5.5 x 5.9 centimeters = volume: 184 mL. Small simple appearing cortical cyst measuring 14 millimeters. Echogenicity within normal limits. No solid mass or hydronephrosis visualized. Left Kidney: Renal measurements: 13.2 x 6.3 x 6.7 centimeters = volume: 288 mL. Simple appearing upper pole cyst measuring 4.5 centimeters redemonstrated. Echogenicity within normal limits. No solid mass or hydronephrosis visualized. Bladder: Appears normal for degree of bladder distention. Both ureteral jets detected with Doppler. IMPRESSION: No acute renal findings.  Simple renal cysts. Electronically Signed   By:  Genevie Ann M.D.   On: 11/30/2018 17:31   Nm Pulmonary Perf And Vent  Result Date: 12/01/2018 CLINICAL DATA:  Suspected.  Positive D-dimer. EXAM: NUCLEAR MEDICINE VENTILATION - PERFUSION LUNG SCAN TECHNIQUE: Ventilation images were obtained in multiple projections using inhaled aerosol Tc-35mDTPA. Perfusion images were obtained in multiple projections after intravenous injection of Tc-972mAA. RADIOPHARMACEUTICALS:  31.0 mCi of Tc-9926mPA aerosol inhalation and 4.2 mCi Tc99m60m IV COMPARISON:  Chest x-ray 11/29/2018. FINDINGS: No ventilation perfusion mismatches noted to suggest pulmonary embolus. This is a low probability scan. IMPRESSION: Low probability for pulmonary embolus. Electronically Signed   By: ThomMarcello Mooresgister   On: 12/01/2018 13:45   Dg Chest Port 1 View  Result Date: 12/02/2018 CLINICAL DATA:  Cough and fever EXAM: PORTABLE CHEST 1 VIEW COMPARISON:  12/01/2018 FINDINGS: Cardiac shadow remains mildly enlarged. Left chest wall port is again noted and stable. Postsurgical changes are seen. Old right clavicular fracture is noted. The lungs are well aerated bilaterally. Previously seen nodular densities on the left are not as well appreciated on today's exam and were likely related to nipple shadows. The markers appear extrinsic to the chest wall on the left on today's image. No new focal abnormality is noted. IMPRESSION: No focal infiltrate is seen. Previously seen nodular changes on the left are not well appreciated on today's exam likely related to nipple shadow. Electronically Signed   By: MarkInez Catalina.   On: 12/02/2018 06:34   Dg Chest Port 1 View  Result Date: 12/01/2018 CLINICAL DATA:  Pneumonia. EXAM: PORTABLE CHEST 1 VIEW COMPARISON:  Radiographs of November 29, 2018. PET scan of December 31, 2016. FINDINGS: The heart size and mediastinal contours are within normal limits. Left internal jugular Port-A-Cath is unchanged in position. No pneumothorax or pleural effusion is noted. Mild  left perihilar subsegmental atelectasis is noted. Probable calcified granuloma is seen laterally in left lower lobe. Another nodular density seen over left lung base which may represent overlying nipple shadow. The visualized skeletal structures are unremarkable. IMPRESSION: Mild left perihilar subsegmental atelectasis is noted. Probable calcified granuloma seen in left lower lobe. Another nodular density is seen over left lung base which may represent overlying nipple shadow; repeat radiograph with nipple markers is recommended to rule out pulmonary nodule. Electronically Signed   By: JameMarijo Conception  M.D.   On: 12/01/2018 13:57     MEDICATIONS: I have reviewed the patient's current medications.     Assessment/Plan:  1.  Severe thrombocytopenia: - His platelet count dropped to 9000 today. - Did not have any major bleeding.  One episode of minor nosebleed. - Cefepime was discontinued this morning. - I have reviewed his peripheral blood smear.  Occasional giant platelets were seen.  No significant schistocytosis.  No abnormal white cells. - Differential diagnosis includes drug-induced thrombocytopenia versus immune thrombocytopenia. -I have recommended IVIG 1 g/kg x 2 days.  I talked to the patient about side effects including mild hemolysis.  He is agreeable to proceed with the treatment. - If the platelet count improves to more than 20,000 tomorrow, we could potentially hold off on second day of IVIG.  2.  Multiple myeloma: - We would consider decreasing the dose of carfilzomib when he starts his next treatment. -He had a dose of 70 mg/m on 11/28/2018 and came to the hospital with fever.  All questions were answered. The patient knows to call the clinic with any problems, questions or concerns. We can certainly see the patient much sooner if necessary.    Derek Jack

## 2018-12-02 NOTE — Progress Notes (Signed)
IVIG administered at rate 29.4 mL/hr. Patient is tolerating administration and denies any discomfort or reaction. Vital signs are as follows    12/02/18 1736  Vitals  Temp 98.2 F (36.8 C)  Temp Source Oral  BP (!) 174/98  BP Location Right Arm  BP Method Automatic  Patient Position (if appropriate) Sitting  Pulse Rate 86  Pulse Rate Source Dinamap  Resp 18  Oxygen Therapy  SpO2 100 %  O2 Device Nasal Cannula  O2 Flow Rate (L/min) 2 L/min  Continuing to monitor patient

## 2018-12-02 NOTE — Progress Notes (Signed)
IVIG administration has not been performed because family has multiple questions regarding administration. Patient's wife questions the administration of IVIG versus blood administration. MD has been E-Paged. Will not administer medication until family is comfortable with administration and has spoken with MD.

## 2018-12-03 LAB — CBC WITH DIFFERENTIAL/PLATELET
Abs Immature Granulocytes: 0.02 10*3/uL (ref 0.00–0.07)
Basophils Absolute: 0 10*3/uL (ref 0.0–0.1)
Basophils Relative: 0 %
Eosinophils Absolute: 0.1 10*3/uL (ref 0.0–0.5)
Eosinophils Relative: 3 %
HCT: 22.9 % — ABNORMAL LOW (ref 39.0–52.0)
Hemoglobin: 7.4 g/dL — ABNORMAL LOW (ref 13.0–17.0)
Immature Granulocytes: 1 %
Lymphocytes Relative: 17 %
Lymphs Abs: 0.7 10*3/uL (ref 0.7–4.0)
MCH: 34.4 pg — ABNORMAL HIGH (ref 26.0–34.0)
MCHC: 32.3 g/dL (ref 30.0–36.0)
MCV: 106.5 fL — ABNORMAL HIGH (ref 80.0–100.0)
Monocytes Absolute: 0.8 10*3/uL (ref 0.1–1.0)
Monocytes Relative: 20 %
NEUTROS ABS: 2.3 10*3/uL (ref 1.7–7.7)
Neutrophils Relative %: 59 %
PLATELETS: 13 10*3/uL — AB (ref 150–400)
RBC: 2.15 MIL/uL — AB (ref 4.22–5.81)
RDW: 14.9 % (ref 11.5–15.5)
WBC: 3.9 10*3/uL — ABNORMAL LOW (ref 4.0–10.5)
nRBC: 0 % (ref 0.0–0.2)

## 2018-12-03 LAB — RENAL FUNCTION PANEL
Albumin: 2.5 g/dL — ABNORMAL LOW (ref 3.5–5.0)
Anion gap: 3 — ABNORMAL LOW (ref 5–15)
BUN: 26 mg/dL — ABNORMAL HIGH (ref 8–23)
CALCIUM: 7.1 mg/dL — AB (ref 8.9–10.3)
CO2: 17 mmol/L — ABNORMAL LOW (ref 22–32)
Chloride: 119 mmol/L — ABNORMAL HIGH (ref 98–111)
Creatinine, Ser: 1.84 mg/dL — ABNORMAL HIGH (ref 0.61–1.24)
GFR calc Af Amer: 42 mL/min — ABNORMAL LOW (ref >60)
GFR calc non Af Amer: 36 mL/min — ABNORMAL LOW (ref >60)
Glucose, Bld: 98 mg/dL (ref 70–99)
Phosphorus: 1.3 mg/dL — ABNORMAL LOW (ref 2.5–4.6)
Potassium: 3.8 mmol/L (ref 3.5–5.1)
Sodium: 139 mmol/L (ref 135–145)

## 2018-12-03 LAB — MAGNESIUM: Magnesium: 2.2 mg/dL (ref 1.7–2.4)

## 2018-12-03 LAB — PREPARE RBC (CROSSMATCH)

## 2018-12-03 MED ORDER — SODIUM CHLORIDE 0.9% IV SOLUTION: Freq: Once | INTRAVENOUS | Status: DC

## 2018-12-03 MED ORDER — POLYETHYLENE GLYCOL 3350 17 G PO PACK
17.0000 g | PACK | Freq: Every day | ORAL | Status: DC
  Administered 2018-12-04 – 2018-12-05 (×2): 17 g via ORAL
  Filled 2018-12-03 (×2): qty 1

## 2018-12-03 MED ORDER — BUDESONIDE 0.25 MG/2ML IN SUSP
0.2500 mg | Freq: Two times a day (BID) | RESPIRATORY_TRACT | Status: DC
  Administered 2018-12-03 – 2018-12-05 (×4): 0.25 mg via RESPIRATORY_TRACT
  Filled 2018-12-03 (×4): qty 2

## 2018-12-03 MED ORDER — IMMUNE GLOBULIN (HUMAN) 20 GM/200ML IV SOLN
1.0000 g/kg | Freq: Once | INTRAVENOUS | Status: AC
  Administered 2018-12-03: 100 g via INTRAVENOUS
  Filled 2018-12-03: qty 1000

## 2018-12-03 MED ORDER — IPRATROPIUM-ALBUTEROL 0.5-2.5 (3) MG/3ML IN SOLN
3.0000 mL | Freq: Four times a day (QID) | RESPIRATORY_TRACT | Status: DC
  Administered 2018-12-03 – 2018-12-05 (×8): 3 mL via RESPIRATORY_TRACT
  Filled 2018-12-03 (×8): qty 3

## 2018-12-03 MED ORDER — BISACODYL 5 MG PO TBEC
10.0000 mg | DELAYED_RELEASE_TABLET | Freq: Once | ORAL | Status: AC
  Administered 2018-12-03: 10 mg via ORAL
  Filled 2018-12-03: qty 2

## 2018-12-03 MED ORDER — K PHOS MONO-SOD PHOS DI & MONO 155-852-130 MG PO TABS
500.0000 mg | ORAL_TABLET | Freq: Three times a day (TID) | ORAL | Status: DC
  Administered 2018-12-03 – 2018-12-04 (×3): 500 mg via ORAL
  Filled 2018-12-03 (×3): qty 2

## 2018-12-03 NOTE — Progress Notes (Signed)
PROGRESS NOTE  Johnny Lucas  ZGY:[id number]  DOB: Nov 18, 1946  DOA: 11/29/2018 PCP: [name], MD  Brief Admission Hx: 72 y.o. male who is currently undergoing chemotherapy for multiple myeloma and recently had a infusion done yesterday at the cancer center reportedly has had fever, cough congestion and left eye redness for the past couple of days.  His temperature was up to 102.   He was admitted with pneumonia.    MDM/Assessment & Plan:   1. Sepsis secondary to pneumonia -this patient is immunocompromised and has recently received chemotherapy treatment.  Continue supportive care. Sepsis protocol started and code sepsis was called.  He is clinically better and antibiotics have been discontinued.   2. Thrombocytopenia - acute drop in platelets, small amount nose bleed, recently had chemo treatment.  Oncology consulted and started the patient on IVIG.  Differential includes drug-induced thrombocytopenia versus immune mediated thrombocytopenia.     3. Elevated D dimer - Pt continues to have SOB, V/Q scan negative for PE.  4. Acute bronchitis, secondary to parainfluenza.  He does have continued wheezing.  Chest x-ray does not show any obvious pneumonia.  He was briefly treated with antibiotics, but these have since been discontinued.  Continue supportive management. 5. Acute kidney injury- mild improvement of creatinine with hydration.  Continue to monitor.  Hold nephrotoxic agents.  6. Generalized weakness- continues to feel tired and weak.  Had some shortness of breath this morning.  Since hemoglobin is low, he will receive PRBC transfusion.  PT eval.  7. Essential hypertension - suboptimally controlled, continued on home dose of Toprol.  Amlodipine has been added as well as hydralazine.    8. Metabolic acidosis - sodium bicarbonate tabs added x 2 days.  9. Dehydration-treating with IV fluid hydration. 10. Anemia due to chemotherapy- hemoglobin has declined since admission.  Anemia may have  been worsened by IVIG.  Per oncology recommendations, since he is symptomatic, will transfuse 1 unit of PRBC. 11. Multiple myeloma-currently being treated with chemotherapy.  Follow-up with oncology team after discharge.   DVT Prophylaxis: Lovenox Code Status: Full Family Communication: Wife at bedside  Antimicrobials:  Vancomycin 12/3-12/4  Cefepime 12/3 >12/5  Doxycycline 12/5 - 12/6  Subjective: Has been feeling nauseous and has had decreased p.o. intake.  Had vomiting yesterday.  Is having small amounts of bowel movements every day.  Still feels short of breath.  Cough is nonproductive.  He is feeling weak and tired.  Has shortness of breath earlier today.  Objective: Vitals:   12/03/18 1711 12/03/18 1726 12/03/18 1741 12/03/18 1756  BP: (!) 164/93 (!) 180/98 (!) 173/83 (!) 174/94  Pulse: 74 81 81 81  Resp: _0 Temp: 99.4 F (37.4 C) 98.6 F (37 C) 98.9 F (37.2 C) 98.9 F (37.2 C)  TempSrc: Oral Oral Oral Oral  SpO2: 100% 99% 99% 100%  Weight:      Height:        Intake/Output Summary (Last 24 hours) at 12/03/2018 1941 Last data filed at 12/03/2018 1700 Gross per 24 hour  Intake 1994.4 ml  Output 300 ml  Net 1694.4 ml   Filed Weights   11/29/18 0947 11/29/18 1331 12/01/18 0437  Weight: 92.1 kg 96.4 kg 97.9 kg     Exam:  General exam: Alert, awake, oriented x 3 Respiratory system: bilateral wheezes. Respiratory effort normal. Cardiovascular system:RRR. No murmurs, rubs, gallops. Gastrointestinal system: Abdomen is distended, soft and nontender. No organomegaly or masses felt. Normal bowel sounds  heard. Central nervous system: Alert and oriented. No focal neurological deficits. Extremities: 1+ edema bilaterally Skin: No rashes, lesions or ulcers Psychiatry: Judgement and insight appear normal. Mood & affect appropriate.    Data Reviewed: Basic Metabolic Panel: Recent Labs  Lab 11/29/18 0959 11/30/18 0408 12/01/18 0355 12/02/18 0407  12/03/18 0705  NA 136 138 139 142 139  K 3.3* 3.9 3.9 3.6 3.8  CL 105 114* 118* 121* 119*  CO2 21* 18* 16* 15* 17*  GLUCOSE 117* 96 97 110* 98  BUN 31* 29* 21 21 26*  CREATININE 2.78* 2.55* 1.78* 1.82* 1.84*  CALCIUM 8.6* 6.8* 7.2* 7.5* 7.1*  MG  --  1.6* 2.1  --  2.2  PHOS  --   --   --   --  1.3*   Liver Function Tests: Recent Labs  Lab 11/28/18 0832 11/29/18 0959 11/30/18 0408 12/01/18 0355 12/02/18 0407 12/03/18 0705  AST 19 49* 20 23 45*  --   ALT _0 --   ALKPHOS 26* 26* 21* 23* 23*  --   BILITOT 0.6 0.4 0.3 0.5 0.8  --   PROT 6.5 5.6* 4.6* 4.8* 4.8*  --   ALBUMIN 4.1 3.5 2.7* 2.8* 2.9* 2.5*   No results for input(s): LIPASE, AMYLASE in the last 168 hours. No results for input(s): AMMONIA in the last 168 hours. CBC: Recent Labs  Lab 11/29/18 0959 11/30/18 0408 12/01/18 0355 12/02/18 0407 12/03/18 0705  WBC 7.4 7.4 5.5 5.2 3.9*  NEUTROABS 6.5 6.0 4.8 4.0 2.3  HGB 9.7* 8.4* 8.7* 8.5* 7.4*  HCT 29.9* 27.1* 28.1* 26.7* 22.9*  MCV 107.6* 108.4* 109.8* 106.4* 106.5*  PLT 126* 62* 21* 9* 13*   Cardiac Enzymes: Recent Labs  Lab 11/29/18 0959  TROPONINI 0.03*   CBG (last 3)  No results for input(s): GLUCAP in the last 72 hours. Recent Results (from the past 240 hour(s))  Culture, blood (Routine x 2)     Status: None (Preliminary result)   Collection Time: 11/29/18  9:59 AM  Result Value Ref Range Status   Specimen Description BLOOD RIGHT ARM DRAWN BY RN  Final   Special Requests   Final    BOTTLES DRAWN AEROBIC AND ANAEROBIC Blood Culture adequate volume   Culture   Final    NO GROWTH 4 DAYS Performed at [facility], [address] [place], [place] [address]    Report Status PENDING  Incomplete  Culture, blood (Routine x 2)     Status: None (Preliminary result)   Collection Time: 11/29/18 10:11 AM  Result Value Ref Range Status   Specimen Description BLOOD DRAWN BY RN PORTA CATH  Final   Special Requests   Final    BOTTLES DRAWN AEROBIC  AND ANAEROBIC Blood Culture adequate volume   Culture   Final    NO GROWTH 4 DAYS Performed at [facility], [address] [place], [place] [address]    Report Status PENDING  Incomplete  Group A Strep by PCR     Status: None   Collection Time: 11/29/18 10:23 AM  Result Value Ref Range Status   Group A Strep by PCR NOT DETECTED NOT DETECTED Final    Comment: Performed at [facility], [address] [place],  [address]  Respiratory Panel by PCR     Status: Abnormal   Collection Time: 11/29/18 10:23 AM  Result Value Ref Range Status   Adenovirus NOT DETECTED NOT DETECTED Final   Coronavirus  229E NOT DETECTED NOT DETECTED Final   Coronavirus HKU1 NOT DETECTED NOT DETECTED Final   Coronavirus NL63 NOT DETECTED NOT DETECTED Final   Coronavirus OC43 NOT DETECTED NOT DETECTED Final   Metapneumovirus NOT DETECTED NOT DETECTED Final   Rhinovirus / Enterovirus NOT DETECTED NOT DETECTED Final   Influenza A NOT DETECTED NOT DETECTED Final   Influenza A H1 NOT DETECTED NOT DETECTED Final   Influenza A H1 2009 NOT DETECTED NOT DETECTED Final   Influenza A H3 NOT DETECTED NOT DETECTED Final   Influenza B NOT DETECTED NOT DETECTED Final   Parainfluenza Virus 1 DETECTED (A) NOT DETECTED Final   Parainfluenza Virus 2 NOT DETECTED NOT DETECTED Final   Parainfluenza Virus 3 NOT DETECTED NOT DETECTED Final   Parainfluenza Virus 4 NOT DETECTED NOT DETECTED Final   Respiratory Syncytial Virus NOT DETECTED NOT DETECTED Final   Bordetella pertussis NOT DETECTED NOT DETECTED Final   Chlamydophila pneumoniae NOT DETECTED NOT DETECTED Final   Mycoplasma pneumoniae NOT DETECTED NOT DETECTED Final  MRSA PCR Screening     Status: None   Collection Time: 11/29/18  3:15 PM  Result Value Ref Range Status   MRSA by PCR NEGATIVE NEGATIVE Final    Comment:        The GeneXpert MRSA Assay (FDA approved for NASAL specimens only), is one component of a comprehensive MRSA colonization surveillance  program. It is not intended to diagnose MRSA infection nor to guide or monitor treatment for MRSA infections. Performed at [facility], [address] [place], [place] [address]   Urine culture     Status: None   Collection Time: 11/29/18  3:24 PM  Result Value Ref Range Status   Specimen Description   Final    URINE, RANDOM Performed at [facility], [address], [place], [place] [address]    Special Requests   Final    NONE Performed at [facility], [address] [place], [place] [address]    Culture   Final    NO GROWTH Performed at [facility], [place] [address], [place],  [address]    Report Status 12/01/2018 FINAL  Final     Studies: Dg Chest Port 1 View  Result Date: 12/02/2018 CLINICAL DATA:  Cough and fever EXAM: PORTABLE CHEST 1 VIEW COMPARISON:  12/01/2018 FINDINGS: Cardiac shadow remains mildly enlarged. Left chest wall port is again noted and stable. Postsurgical changes are seen. Old right clavicular fracture is noted. The lungs are well aerated bilaterally. Previously seen nodular densities on the left are not as well appreciated on today's exam and were likely related to nipple shadows. The markers appear extrinsic to the chest wall on the left on today's image. No new focal abnormality is noted. IMPRESSION: No focal infiltrate is seen. Previously seen nodular changes on the left are not well appreciated on today's exam likely related to nipple shadow. Electronically Signed   By: Inez Catalina M.D.   On: 12/02/2018 06:34     Scheduled Meds: . sodium chloride   Intravenous Once  . acyclovir  800 mg Oral BID  . amLODipine  5 mg Oral Daily  . bisacodyl  10 mg Oral Once  . budesonide (PULMICORT) nebulizer solution  0.25 mg Nebulization BID  . calcium-vitamin D  2 tablet Oral Daily  . folic acid  1 mg Oral Daily  . gabapentin  300 mg Oral BID  . hydrALAZINE  50 mg Oral Q8H  . ipratropium-albuterol  3  mL Nebulization Q6H  . loratadine  10 mg Oral  Daily  . metoprolol succinate  50 mg Oral Daily  . multivitamin with minerals  1 tablet Oral Daily  . pantoprazole  40 mg Oral QPM  . phosphorus  500 mg Oral TID  . polyethylene glycol  17 g Oral Daily  . saccharomyces boulardii  250 mg Oral BID  . sodium bicarbonate  650 mg Oral BID  . tamoxifen  20 mg Oral Daily   Continuous Infusions: . sodium chloride 1,000 mL (11/29/18 1019)  . sodium chloride 1,000 mL (12/02/18 0903)  . sodium chloride      Principal Problem:   Sepsis due to pneumonia [facility]) Active Problems:   GERD   Bone metastases ([name]   Multiple myeloma ([name]   Multiple myeloma not having achieved remission ([name]   Essential hypertension   HCAP (healthcare-associated pneumonia)   Immunocompromised ([name]   Fever and chills   Generalized weakness   Peripheral neuropathy   Dehydration   Anemia due to chemotherapy   AKI (acute kidney injury) ([place])   Hypotension   Parainfluenza infection  Time spent: 35 minutes    Kathie Dike, MD [facility] Pager [phone]  If 7PM-7AM, please contact night-coverage [email or web address] Password [email or web address] 12/03/2018, 7:41 PM    LOS: 4 days

## 2018-12-03 NOTE — Progress Notes (Signed)
IVIG administered to patient at rate 58.7. Patient denies discomfort or reaction to medication. Vital signs are as follows     12/03/18 1711  Vitals  Temp 99.4 F (37.4 C)  Temp Source Oral  BP (!) 164/93  BP Location Right Arm  BP Method Automatic  Patient Position (if appropriate) Sitting  Pulse Rate 74  Pulse Rate Source Dinamap  Resp 18  Oxygen Therapy  SpO2 100 %  O2 Device Room Air   Continuing to monitor patient

## 2018-12-03 NOTE — Progress Notes (Signed)
IVIG administered to patient at rate 118. Patient denies any discomfort or reaction. Vital signs are as follows     12/03/18 1726  Vitals  Temp 98.6 F (37 C)  Temp Source Oral  BP (!) 180/98  BP Location Right Arm  BP Method Automatic  Patient Position (if appropriate) Sitting  Pulse Rate 81  Pulse Rate Source Dinamap  Resp 18  Oxygen Therapy  SpO2 99 %  O2 Device Room Air   Continuing to monitor patient

## 2018-12-03 NOTE — Progress Notes (Signed)
CRITICAL VALUE ALERT  Critical Value:  Platelets  Date & Time Notied: 12/03/2018, 6161  Provider Notified: Dr. Roderic Palau

## 2018-12-03 NOTE — Progress Notes (Addendum)
IVIG administered to patient at rate 29.4. Patient denies any discomfort or reaction. Vital signs are as follows     12/03/18 1655  Vitals  Temp 98.4 F (36.9 C)  Temp Source Oral  BP (!) 179/91  BP Location Right Arm  BP Method Automatic  Patient Position (if appropriate) Sitting  Pulse Rate 73  Pulse Rate Source Dinamap  Resp 18  Oxygen Therapy  SpO2 100 %  O2 Device Room Air   Continuing to monitor patient

## 2018-12-03 NOTE — Progress Notes (Signed)
IVIG administered at rate 235. Patient denies discomfort or reaction to medication. Vital signs are as follows     12/03/18 1741  Vitals  Temp 98.9 F (37.2 C)  Temp Source Oral  BP (!) 173/83  BP Location Right Arm  BP Method Automatic  Patient Position (if appropriate) Sitting  Pulse Rate 81  Pulse Rate Source Dinamap  Resp 18  Oxygen Therapy  SpO2 99 %  O2 Device Room Air   Continuing to monitor patient

## 2018-12-03 NOTE — Progress Notes (Signed)
Patient receiving IVIG at rate 235. Patient is sitting comfortably in recliner and denies any discomfort or reaction. Vital signs are as follows    12/03/18 1756  Vitals  Temp 98.9 F (37.2 C)  Temp Source Oral  BP (!) 174/94  BP Location Right Arm  BP Method Automatic  Patient Position (if appropriate) Sitting  Pulse Rate 81  Pulse Rate Source Dinamap  Resp 18  Oxygen Therapy  SpO2 100 %  O2 Device Room Air     12/03/18 1756  Vitals  Temp 98.9 F (37.2 C)  Temp Source Oral  BP (!) 174/94  BP Location Right Arm  BP Method Automatic  Patient Position (if appropriate) Sitting  Pulse Rate 81  Pulse Rate Source Dinamap  Resp 18  Oxygen Therapy  SpO2 100 %  O2 Device Room Air   Will continue to monitor patient

## 2018-12-03 NOTE — Progress Notes (Signed)
Patient refused nebs made txs prn

## 2018-12-04 LAB — CBC
HEMATOCRIT: 26.2 % — AB (ref 39.0–52.0)
HEMOGLOBIN: 8.4 g/dL — AB (ref 13.0–17.0)
MCH: 32.3 pg (ref 26.0–34.0)
MCHC: 32.1 g/dL (ref 30.0–36.0)
MCV: 100.8 fL — ABNORMAL HIGH (ref 80.0–100.0)
Platelets: 23 10*3/uL — CL (ref 150–400)
RBC: 2.6 MIL/uL — AB (ref 4.22–5.81)
RDW: 19.8 % — ABNORMAL HIGH (ref 11.5–15.5)
WBC: 3.9 10*3/uL — ABNORMAL LOW (ref 4.0–10.5)
nRBC: 0 % (ref 0.0–0.2)

## 2018-12-04 LAB — TYPE AND SCREEN
ABO/RH(D): O POS
Antibody Screen: NEGATIVE
Unit division: 0

## 2018-12-04 LAB — COMPREHENSIVE METABOLIC PANEL
ALT: 24 U/L (ref 0–44)
AST: 52 U/L — ABNORMAL HIGH (ref 15–41)
Albumin: 2.6 g/dL — ABNORMAL LOW (ref 3.5–5.0)
Alkaline Phosphatase: 21 U/L — ABNORMAL LOW (ref 38–126)
Anion gap: 3 — ABNORMAL LOW (ref 5–15)
BUN: 33 mg/dL — ABNORMAL HIGH (ref 8–23)
CO2: 17 mmol/L — ABNORMAL LOW (ref 22–32)
Calcium: 6.9 mg/dL — ABNORMAL LOW (ref 8.9–10.3)
Chloride: 116 mmol/L — ABNORMAL HIGH (ref 98–111)
Creatinine, Ser: 1.95 mg/dL — ABNORMAL HIGH (ref 0.61–1.24)
GFR calc Af Amer: 39 mL/min — ABNORMAL LOW (ref >60)
GFR calc non Af Amer: 34 mL/min — ABNORMAL LOW (ref >60)
Glucose, Bld: 97 mg/dL (ref 70–99)
Potassium: 3.5 mmol/L (ref 3.5–5.1)
Sodium: 136 mmol/L (ref 135–145)
Total Bilirubin: 1 mg/dL (ref 0.3–1.2)
Total Protein: 7.1 g/dL (ref 6.5–8.1)

## 2018-12-04 LAB — CULTURE, BLOOD (ROUTINE X 2)
CULTURE: NO GROWTH
CULTURE: NO GROWTH
Special Requests: ADEQUATE
Special Requests: ADEQUATE

## 2018-12-04 LAB — PHOSPHORUS: Phosphorus: 2.5 mg/dL (ref 2.5–4.6)

## 2018-12-04 LAB — LACTATE DEHYDROGENASE: LDH: 415 U/L — ABNORMAL HIGH (ref 98–192)

## 2018-12-04 LAB — BILIRUBIN, DIRECT: Bilirubin, Direct: 0.2 mg/dL (ref 0.0–0.2)

## 2018-12-04 LAB — BPAM RBC
Blood Product Expiration Date: 201912272359
ISSUE DATE / TIME: 201912072237
Unit Type and Rh: 5100

## 2018-12-04 MED ORDER — ALBUMIN HUMAN 25 % IV SOLN
25.0000 g | Freq: Once | INTRAVENOUS | Status: AC
  Administered 2018-12-04: 25 g via INTRAVENOUS
  Filled 2018-12-04: qty 50

## 2018-12-04 MED ORDER — PREDNISONE 20 MG PO TABS
40.0000 mg | ORAL_TABLET | Freq: Every day | ORAL | Status: DC
  Administered 2018-12-04 – 2018-12-05 (×2): 40 mg via ORAL
  Filled 2018-12-04 (×2): qty 2

## 2018-12-04 NOTE — Progress Notes (Signed)
CRITICAL VALUE ALERT  Critical Value:  Platelets 23  Date & Time Notied:  12/04/2018 0910  Provider Notified: Roderic Palau, MD  Orders Received/Actions taken: awaiting further instructions at this time

## 2018-12-04 NOTE — Progress Notes (Signed)
PROGRESS NOTE  Johnny Lucas  OJJ:[id number]  DOB: 06-16-1946  DOA: 11/29/2018 PCP: [name], MD  Brief Admission Hx: 72 y.o. male who is currently undergoing chemotherapy for multiple myeloma and recently had a infusion done yesterday at the cancer center reportedly has had fever, cough congestion and left eye redness for the past couple of days.  His temperature was up to 102.   He was admitted with pneumonia.    MDM/Assessment & Plan:   1. Sepsis secondary to bronchitis-this patient is immunocompromised and has recently received chemotherapy treatment.  Continue supportive care. Sepsis protocol started and code sepsis was called.  He is clinically better and antibiotics have been discontinued.   2. Thrombocytopenia - acute drop in platelets, small amount nose bleed, recently had chemo treatment.  Oncology consulted and started the patient on IVIG.  Differential includes drug-induced thrombocytopenia versus immune mediated thrombocytopenia.  Overall platelet count is improving      3. Acute bronchitis, secondary to parainfluenza.  He does have continued wheezing.  Chest x-ray does not show any obvious pneumonia.  He was briefly treated with antibiotics, but these have since been discontinued.  Continue bronchodilators.  Will start short course of prednisone. 4. Acute kidney injury- renal ultrasound did not show any signs of obstruction.  Initial mild improvement of creatinine with hydration.  Since that time, creatinine appears to have stalled.  Currently at 1.9.  Discussed with Dr. Posey Pronto on-call for nephrology.  With patient's hypoalbuminemia, recommendations were for albumin infusion.  Continue monitoring.  If creatinine continues to worsen, can consider formal nephrology consultation. 5. Generalized weakness- patient feels somewhat improved after transfusion of PRBC.  Will request physical therapy evaluation.  6. Essential hypertension - suboptimally controlled, continued on home dose of  Toprol.  Amlodipine has been added as well as hydralazine.    7. Metabolic acidosis - sodium bicarbonate tabs added twice daily 8. Dehydration-treating with IV fluid hydration. 9. Anemia due to chemotherapy- hemoglobin has declined since admission.  Anemia may have been worsened by IVIG.  Per oncology recommendations, since he was symptomatic, he was transfused 1 unit of PRBC.  Follow-up hemoglobin has improved 10. Multiple myeloma-currently being treated with chemotherapy.  Follow-up with oncology team after discharge.   DVT Prophylaxis: Lovenox Code Status: Full Family Communication:  No family present  Antimicrobials:  Vancomycin 12/3-12/4  Cefepime 12/3 >12/5  Doxycycline 12/5 - 12/6  Subjective: Still feels mildly short of breath.  Has had several bowel movements after receiving laxatives.  Nausea is better.  Appetite improving.  No longer having any epistaxis  Objective: Vitals:   12/04/18 0805 12/04/18 0957 12/04/18 1444 12/04/18 1547  BP: (!) 150/88   (!) 159/82  Pulse: 80   87  Resp:    18  Temp:    98.1 F (36.7 C)  TempSrc:    Oral  SpO2:  99% 99% 100%  Weight:      Height:        Intake/Output Summary (Last 24 hours) at 12/04/2018 1658 Last data filed at 12/04/2018 1600 Gross per 24 hour  Intake 1603.53 ml  Output -  Net 1603.53 ml   Filed Weights   11/29/18 0947 11/29/18 1331 12/01/18 0437  Weight: 92.1 kg 96.4 kg 97.9 kg     Exam:  General exam: Alert, awake, oriented x 3 Respiratory system: Bilateral wheezing. Respiratory effort normal. Cardiovascular system:RRR. No murmurs, rubs, gallops. Gastrointestinal system: Abdomen is nondistended, soft and nontender. No organomegaly or masses felt. Normal  bowel sounds heard. Central nervous system: Alert and oriented. No focal neurological deficits. Extremities: 1+ pitting edema bilaterally Skin: No rashes, lesions or ulcers Psychiatry: Judgement and insight appear normal. Mood & affect appropriate.    Data Reviewed: Basic Metabolic Panel: Recent Labs  Lab 11/30/18 0408 12/01/18 0355 12/02/18 0407 12/03/18 0705 12/04/18 0658  NA 138 139 142 139 136  K 3.9 3.9 3.6 3.8 3.5  CL 114* 118* 121* 119* 116*  CO2 18* 16* 15* 17* 17*  GLUCOSE 96 97 110* 98 97  BUN 29* 21 21 26* 33*  CREATININE 2.55* 1.78* 1.82* 1.84* 1.95*  CALCIUM 6.8* 7.2* 7.5* 7.1* 6.9*  MG 1.6* 2.1  --  2.2  --   PHOS  --   --   --  1.3* 2.5   Liver Function Tests: Recent Labs  Lab 11/29/18 0959 11/30/18 0408 12/01/18 0355 12/02/18 0407 12/03/18 0705 12/04/18 0658  AST 49* 20 23 45*  --  52*  ALT _0 --  24  ALKPHOS 26* 21* 23* 23*  --  21*  BILITOT 0.4 0.3 0.5 0.8  --  1.0  PROT 5.6* 4.6* 4.8* 4.8*  --  7.1  ALBUMIN 3.5 2.7* 2.8* 2.9* 2.5* 2.6*   No results for input(s): LIPASE, AMYLASE in the last 168 hours. No results for input(s): AMMONIA in the last 168 hours. CBC: Recent Labs  Lab 11/29/18 0959 11/30/18 0408 12/01/18 0355 12/02/18 0407 12/03/18 0705 12/04/18 0658  WBC 7.4 7.4 5.5 5.2 3.9* 3.9*  NEUTROABS 6.5 6.0 4.8 4.0 2.3  --   HGB 9.7* 8.4* 8.7* 8.5* 7.4* 8.4*  HCT 29.9* 27.1* 28.1* 26.7* 22.9* 26.2*  MCV 107.6* 108.4* 109.8* 106.4* 106.5* 100.8*  PLT 126* 62* 21* 9* 13* 23*   Cardiac Enzymes: Recent Labs  Lab 11/29/18 0959  TROPONINI 0.03*   CBG (last 3)  No results for input(s): GLUCAP in the last 72 hours. Recent Results (from the past 240 hour(s))  Culture, blood (Routine x 2)     Status: None   Collection Time: 11/29/18  9:59 AM  Result Value Ref Range Status   Specimen Description BLOOD RIGHT ARM DRAWN BY RN  Final   Special Requests   Final    BOTTLES DRAWN AEROBIC AND ANAEROBIC Blood Culture adequate volume   Culture   Final    NO GROWTH 5 DAYS Performed at [facility], [address], [place], [place] [address]    Report Status 12/04/2018 FINAL  Final  Culture, blood (Routine x 2)     Status: None   Collection Time: 11/29/18 10:11 AM  Result Value  Ref Range Status   Specimen Description BLOOD DRAWN BY RN PORTA CATH  Final   Special Requests   Final    BOTTLES DRAWN AEROBIC AND ANAEROBIC Blood Culture adequate volume   Culture   Final    NO GROWTH 5 DAYS Performed at [facility], [address] [place], [place] [address]    Report Status 12/04/2018 FINAL  Final  Group A Strep by PCR     Status: None   Collection Time: 11/29/18 10:23 AM  Result Value Ref Range Status   Group A Strep by PCR NOT DETECTED NOT DETECTED Final    Comment: Performed at [facility], [address] [place], [place] [address]  Respiratory Panel by PCR     Status: Abnormal   Collection Time: 11/29/18 10:23 AM  Result Value Ref Range Status   Adenovirus  NOT DETECTED NOT DETECTED Final   Coronavirus 229E NOT DETECTED NOT DETECTED Final   Coronavirus HKU1 NOT DETECTED NOT DETECTED Final   Coronavirus NL63 NOT DETECTED NOT DETECTED Final   Coronavirus OC43 NOT DETECTED NOT DETECTED Final   Metapneumovirus NOT DETECTED NOT DETECTED Final   Rhinovirus / Enterovirus NOT DETECTED NOT DETECTED Final   Influenza A NOT DETECTED NOT DETECTED Final   Influenza A H1 NOT DETECTED NOT DETECTED Final   Influenza A H1 2009 NOT DETECTED NOT DETECTED Final   Influenza A H3 NOT DETECTED NOT DETECTED Final   Influenza B NOT DETECTED NOT DETECTED Final   Parainfluenza Virus 1 DETECTED (A) NOT DETECTED Final   Parainfluenza Virus 2 NOT DETECTED NOT DETECTED Final   Parainfluenza Virus 3 NOT DETECTED NOT DETECTED Final   Parainfluenza Virus 4 NOT DETECTED NOT DETECTED Final   Respiratory Syncytial Virus NOT DETECTED NOT DETECTED Final   Bordetella pertussis NOT DETECTED NOT DETECTED Final   Chlamydophila pneumoniae NOT DETECTED NOT DETECTED Final   Mycoplasma pneumoniae NOT DETECTED NOT DETECTED Final  MRSA PCR Screening     Status: None   Collection Time: 11/29/18  3:15 PM  Result Value Ref Range Status   MRSA by PCR NEGATIVE NEGATIVE Final    Comment:        The  GeneXpert MRSA Assay (FDA approved for NASAL specimens only), is one component of a comprehensive MRSA colonization surveillance program. It is not intended to diagnose MRSA infection nor to guide or monitor treatment for MRSA infections. Performed at [facility], [address] [place], [place] [address]   Urine culture     Status: None   Collection Time: 11/29/18  3:24 PM  Result Value Ref Range Status   Specimen Description   Final    URINE, RANDOM Performed at [facility], [address], [place], [place] [address]    Special Requests   Final    NONE Performed at [facility], [address] [place], [place] [address]    Culture   Final    NO GROWTH Performed at [facility], [place] [address] [place], [place] [address]    Report Status 12/01/2018 FINAL  Final     Studies: No results found.   Scheduled Meds: . sodium chloride   Intravenous Once  . acyclovir  800 mg Oral BID  . amLODipine  5 mg Oral Daily  . budesonide (PULMICORT) nebulizer solution  0.25 mg Nebulization BID  . calcium-vitamin D  2 tablet Oral Daily  . folic acid  1 mg Oral Daily  . gabapentin  300 mg Oral BID  . hydrALAZINE  50 mg Oral Q8H  . ipratropium-albuterol  3 mL Nebulization Q6H  . loratadine  10 mg Oral Daily  . metoprolol succinate  50 mg Oral Daily  . multivitamin with minerals  1 tablet Oral Daily  . pantoprazole  40 mg Oral QPM  . polyethylene glycol  17 g Oral Daily  . predniSONE  40 mg Oral Q breakfast  . saccharomyces boulardii  250 mg Oral BID  . tamoxifen  20 mg Oral Daily   Continuous Infusions: . sodium chloride 1,000 mL (11/29/18 1019)  . sodium chloride      Principal Problem:   Sepsis due to pneumonia [facility]) Active Problems:   GERD   Bone metastases ([place])   Multiple myeloma ([place])   Multiple myeloma not having achieved remission ([place])   Essential hypertension   HCAP (healthcare-associated  pneumonia)   Immunocompromised ([place])   Fever and chills    Generalized weakness   Peripheral neuropathy   Dehydration   Anemia due to chemotherapy   AKI (acute kidney injury) ([place])   Hypotension   Parainfluenza infection  Time spent: 30 minutes    Kathie Dike, MD [facility] Pager [phone]  If 7PM-7AM, please contact night-coverage [email or web address] Password [email or web address] 12/04/2018, 4:58 PM    LOS: 5 days

## 2018-12-05 DIAGNOSIS — J42 Unspecified chronic bronchitis: Secondary | ICD-10-CM

## 2018-12-05 LAB — CBC
HEMATOCRIT: 23.1 % — AB (ref 39.0–52.0)
Hemoglobin: 7.4 g/dL — ABNORMAL LOW (ref 13.0–17.0)
MCH: 32.3 pg (ref 26.0–34.0)
MCHC: 32 g/dL (ref 30.0–36.0)
MCV: 100.9 fL — AB (ref 80.0–100.0)
Platelets: 34 10*3/uL — ABNORMAL LOW (ref 150–400)
RBC: 2.29 MIL/uL — ABNORMAL LOW (ref 4.22–5.81)
RDW: 19.6 % — ABNORMAL HIGH (ref 11.5–15.5)
WBC: 3.5 10*3/uL — ABNORMAL LOW (ref 4.0–10.5)
nRBC: 0 % (ref 0.0–0.2)

## 2018-12-05 LAB — BASIC METABOLIC PANEL
Anion gap: 4 — ABNORMAL LOW (ref 5–15)
BUN: 35 mg/dL — ABNORMAL HIGH (ref 8–23)
CO2: 18 mmol/L — ABNORMAL LOW (ref 22–32)
Calcium: 6.9 mg/dL — ABNORMAL LOW (ref 8.9–10.3)
Chloride: 115 mmol/L — ABNORMAL HIGH (ref 98–111)
Creatinine, Ser: 1.97 mg/dL — ABNORMAL HIGH (ref 0.61–1.24)
GFR calc Af Amer: 39 mL/min — ABNORMAL LOW (ref >60)
GFR calc non Af Amer: 33 mL/min — ABNORMAL LOW (ref >60)
GLUCOSE: 131 mg/dL — AB (ref 70–99)
Potassium: 3.8 mmol/L (ref 3.5–5.1)
Sodium: 137 mmol/L (ref 135–145)

## 2018-12-05 LAB — HAPTOGLOBIN: Haptoglobin: <10 mg/dL — ABNORMAL LOW (ref 34–200)

## 2018-12-05 LAB — PREPARE RBC (CROSSMATCH)

## 2018-12-05 MED ORDER — HEPARIN SOD (PORK) LOCK FLUSH 100 UNIT/ML IV SOLN
500.0000 [IU] | Freq: Once | INTRAVENOUS | Status: DC
  Filled 2018-12-05: qty 5

## 2018-12-05 MED ORDER — POLYETHYLENE GLYCOL 3350 17 G PO PACK: 17.0000 g | PACK | Freq: Every day | ORAL | 0 refills | Status: DC | PRN

## 2018-12-05 MED ORDER — ALBUTEROL SULFATE HFA 108 (90 BASE) MCG/ACT IN AERS: 2.0000 | INHALATION_SPRAY | Freq: Four times a day (QID) | RESPIRATORY_TRACT | 0 refills | Status: DC | PRN

## 2018-12-05 MED ORDER — SODIUM CHLORIDE 0.9% IV SOLUTION
Freq: Once | INTRAVENOUS | Status: AC
  Administered 2018-12-05: 16:00:00 via INTRAVENOUS

## 2018-12-05 MED ORDER — AMLODIPINE BESYLATE 5 MG PO TABS: 5.0000 mg | ORAL_TABLET | Freq: Every day | ORAL | 0 refills | Status: DC

## 2018-12-05 MED ORDER — PREDNISONE 20 MG PO TABS: 40.0000 mg | ORAL_TABLET | Freq: Every day | ORAL | 0 refills | Status: DC

## 2018-12-05 MED ORDER — HYDRALAZINE HCL 50 MG PO TABS: 50.0000 mg | ORAL_TABLET | Freq: Three times a day (TID) | ORAL | 0 refills | Status: DC

## 2018-12-05 NOTE — Progress Notes (Signed)
Was asked to see pt by Dr. Roderic Palau for his CKD.  Pt with myeloma s/p stem cell transplant now on maintenance chemo.   Pt aware that he has CKD- his oncologist has been watching- admitted now with sepsis/thrombocytopenia- improving - crt fairly stable at 1.8-1.9 but this is worse than his usual baseline of 1.3-1.5 - was given IVF and albumin, now with pitting edema but also on norvasc and hydralazine, normally has a tendency toward volume depletion.    I was thinking possibly a diuretic and follow up OP with [place].  Pt says - "ive been so sick, I think it will come back down" and as above has a tendency toward volume depletion so I did not make any changes, pt will cont to follow up with his oncologist and if after the dust settles there are concerns that we can assist with - he would like to follow up with [facility].  Please call us if this is desired  [place]

## 2018-12-05 NOTE — Discharge Summary (Signed)
Physician Discharge Summary  Johnny Lucas:[id number] DOB: 01-02-1946 DOA: 11/29/2018  PCP: [name], MD  Admit date: 11/29/2018 Discharge date: 12/05/2018  Admitted From: Home Disposition: Home  Recommendations for Outpatient Follow-up:  1. Follow up with PCP in 1-2 weeks 2. Please obtain BMP/CBC in one week 3. Patient will follow-up with oncology later this week.   Discharge Condition: Stable CODE STATUS: Full code Diet recommendation: Heart healthy  Brief/Interim Summary: 62. 72 year old male undergoing chemotherapy for multiple myeloma, reported to the hospital with fever, cough, congestion, generalized weakness.  There was concern that he may have sepsis and pneumonia and he was admitted to the hospital.  Discharge Diagnoses:  Principal Problem:   Sepsis due to pneumonia [facility]) Active Problems:   GERD   Bone metastases ([place])   Multiple myeloma ([place])   Multiple myeloma not having achieved remission ([place])   Essential hypertension   HCAP (healthcare-associated pneumonia)   Immunocompromised ([place])   Fever and chills   Generalized weakness   Peripheral neuropathy   Dehydration   Anemia due to chemotherapy   AKI (acute kidney injury) ([place])   Hypotension   Parainfluenza infection  1. Sepsis secondary to bronchitis-patient was treated with intravenous fluids and initially started on intravenous antibiotics.  Chest x-ray did not show any evidence of pneumonia.  Respiratory panel did return positive for parainfluenza infection.  He was treated supportively.  Blood culture did not show any growth.  Hemodynamics have stabilized and fevers have resolved. 2. Thrombocytopenia -patient had an acute drop in platelets.  He was seen by oncology who felt that he likely has drug-induced thrombocytopenia versus immune mediated thrombocytopenia.  He was treated with IVIG and platelets have since started to improve.  We will continue to hold chemotherapy for now until he follows with his  oncologist.     3. Acute bronchitis, secondary to parainfluenza.  Chest x-ray does not show any obvious pneumonia.  He was briefly treated with antibiotics, but these have since been discontinued.  He was treated with bronchodilators and eventually started on a course of prednisone which improved his symptoms.  Wheezing has nearly resolved as has coughing.  Will complete her course of prednisone. 4. Acute kidney injury- renal ultrasound did not show any signs of obstruction.  Initial mild improvement of creatinine with hydration.  Since that time, creatinine appears to have stalled.  Currently at 1.9.    Discussed with Dr. Moshe Cipro, please see her note.  Patient will follow-up with oncology later this week and have repeat renal function checked.. 5. Generalized weakness-  likely related to acute illness.  He feels significantly better since hospitalization. 6. Essential hypertension - suboptimally controlled, continued on home dose of Toprol.  Amlodipine has been added as well as hydralazine.   7. Metabolic acidosis - sodium bicarbonate tabs added twice daily 8. Dehydration-treating with IV fluid hydration. 9. Anemia due to chemotherapy-Anemia may have been worsened by IVIG.  Per oncology recommendations, since he was symptomatic, he was transfused a total of 2 unit of PRBC.    He should have repeat CBC when he follows up with oncology 10. Multiple myeloma- chemotherapy on hold due to renal issues. Follow-up with oncology team after discharge.   Discharge Instructions  Discharge Instructions    Diet - low sodium heart healthy   Complete by:  As directed    Increase activity slowly   Complete by:  As directed      Allergies as of 12/05/2018  Reactions   Cefepime    Suspected severe thrombocytopenia is a result of cefepime induced antigen platelet destruction      Medication List    STOP taking these medications   aspirin EC 81 MG tablet   cephALEXin 500 MG capsule Commonly  known as:  KEFLEX   doxycycline 100 MG tablet Commonly known as:  VIBRA-TABS   KYPROLIS IV     TAKE these medications   acyclovir 800 MG tablet Commonly known as:  ZOVIRAX Take 1 tablet (800 mg total) by mouth 2 (two) times daily.   albuterol 108 (90 Base) MCG/ACT inhaler Commonly known as:  PROVENTIL HFA;VENTOLIN HFA Inhale 2 puffs into the lungs every 6 (six) hours as needed for wheezing or shortness of breath.   ALPRAZolam 0.5 MG tablet Commonly known as:  XANAX TAKE 1 TABLET BY MOUTH AT BEDTIME AS NEEDED FOR ANXIETY   amLODipine 5 MG tablet Commonly known as:  NORVASC Take 1 tablet (5 mg total) by mouth daily. Start taking on:  12/06/2018   calcium-vitamin D 500-200 MG-UNIT Tabs tablet Commonly known as:  OSCAL WITH D Take 2 tablets by mouth daily.   docusate sodium 100 MG capsule Commonly known as:  COLACE Take 1 capsule (100 mg total) by mouth 2 (two) times daily as needed.   folic acid 1 MG tablet Commonly known as:  FOLVITE Take 1 mg by mouth daily.   gabapentin 300 MG capsule Commonly known as:  NEURONTIN Take 1 capsule (300 mg total) by mouth 2 (two) times daily. Take two times a day, then increase to three times a day in a few weeks.   hydrALAZINE 50 MG tablet Commonly known as:  APRESOLINE Take 1 tablet (50 mg total) by mouth every 8 (eight) hours.   loratadine 10 MG tablet Commonly known as:  CLARITIN Take 1 tablet (10 mg total) by mouth daily.   metoprolol succinate 50 MG 24 hr tablet Commonly known as:  TOPROL-XL Take 50 mg by mouth daily.   multivitamin with minerals Tabs tablet Take 1 tablet by mouth daily.   ondansetron 8 MG tablet Commonly known as:  ZOFRAN Take 8 mg by mouth every 8 (eight) hours as needed for nausea or vomiting.   pantoprazole 40 MG tablet Commonly known as:  PROTONIX Take 40 mg by mouth every evening.   polyethylene glycol packet Commonly known as:  MIRALAX / GLYCOLAX Take 17 g by mouth daily as needed for mild  constipation.   predniSONE 20 MG tablet Commonly known as:  DELTASONE Take 2 tablets (40 mg total) by mouth daily with breakfast. Start taking on:  12/06/2018   pseudoephedrine-guaifenesin 60-600 MG 12 hr tablet Commonly known as:  MUCINEX D Take 1 tablet by mouth every 12 (twelve) hours.   tamoxifen 20 MG tablet Commonly known as:  NOLVADEX Take 1 tablet (20 mg total) by mouth daily.   XGEVA 120 MG/1.7ML Soln injection Generic drug:  denosumab Inject 120 mg into the skin once.       Allergies  Allergen Reactions  . Cefepime     Suspected severe thrombocytopenia is a result of cefepime induced antigen platelet destruction    Consultations:  Oncology  Nephrology   Procedures/Studies: Dg Chest 2 View  Result Date: 11/29/2018 CLINICAL DATA:  Fever.  Chemotherapy. EXAM: CHEST - 2 VIEW COMPARISON:  06/14/2018 FINDINGS: Mild patchy bibasilar airspace disease has developed since the prior study. Possible pneumonia given the symptoms. Heart size normal. Negative for heart failure or  effusion. Port-A-Cath tip in the SVC Multiple chronic rib fractures on the right. Chronic fracture right humerus and right clavicle. IMPRESSION: Mild bibasilar airspace disease, possible pneumonia. Electronically Signed   By: Franchot Gallo M.D.   On: 11/29/2018 11:28   US Renal  Result Date: 11/30/2018 CLINICAL DATA:  72 year old male with acute renal insufficiency. Metastatic breast cancer. EXAM: RENAL / URINARY TRACT ULTRASOUND COMPLETE COMPARISON:  PET-CT 12/31/2016. FINDINGS: Right Kidney: Renal measurements: 10.9 x 5.5 x 5.9 centimeters = volume: 184 mL. Small simple appearing cortical cyst measuring 14 millimeters. Echogenicity within normal limits. No solid mass or hydronephrosis visualized. Left Kidney: Renal measurements: 13.2 x 6.3 x 6.7 centimeters = volume: 288 mL. Simple appearing upper pole cyst measuring 4.5 centimeters redemonstrated. Echogenicity within normal limits. No solid mass or  hydronephrosis visualized. Bladder: Appears normal for degree of bladder distention. Both ureteral jets detected with Doppler. IMPRESSION: No acute renal findings.  Simple renal cysts. Electronically Signed   By: Genevie Ann M.D.   On: 11/30/2018 17:31   Nm Pulmonary Perf And Vent  Result Date: 12/01/2018 CLINICAL DATA:  Suspected.  Positive D-dimer. EXAM: NUCLEAR MEDICINE VENTILATION - PERFUSION LUNG SCAN TECHNIQUE: Ventilation images were obtained in multiple projections using inhaled aerosol Tc-55mDTPA. Perfusion images were obtained in multiple projections after intravenous injection of Tc-975mAA. RADIOPHARMACEUTICALS:  31.0 mCi of Tc-9969mPA aerosol inhalation and 4.2 mCi Tc99m70m IV COMPARISON:  Chest x-ray 11/29/2018. FINDINGS: No ventilation perfusion mismatches noted to suggest pulmonary embolus. This is a low probability scan. IMPRESSION: Low probability for pulmonary embolus. Electronically Signed   By: ThomMarcello Mooresgister   On: 12/01/2018 13:45   Dg Chest Port 1 View  Result Date: 12/02/2018 CLINICAL DATA:  Cough and fever EXAM: PORTABLE CHEST 1 VIEW COMPARISON:  12/01/2018 FINDINGS: Cardiac shadow remains mildly enlarged. Left chest wall port is again noted and stable. Postsurgical changes are seen. Old right clavicular fracture is noted. The lungs are well aerated bilaterally. Previously seen nodular densities on the left are not as well appreciated on today's exam and were likely related to nipple shadows. The markers appear extrinsic to the chest wall on the left on today's image. No new focal abnormality is noted. IMPRESSION: No focal infiltrate is seen. Previously seen nodular changes on the left are not well appreciated on today's exam likely related to nipple shadow. Electronically Signed   By: MarkInez Catalina.   On: 12/02/2018 06:34   Dg Chest Port 1 View  Result Date: 12/01/2018 CLINICAL DATA:  Pneumonia. EXAM: PORTABLE CHEST 1 VIEW COMPARISON:  Radiographs of November 29, 2018. PET  scan of December 31, 2016. FINDINGS: The heart size and mediastinal contours are within normal limits. Left internal jugular Port-A-Cath is unchanged in position. No pneumothorax or pleural effusion is noted. Mild left perihilar subsegmental atelectasis is noted. Probable calcified granuloma is seen laterally in left lower lobe. Another nodular density seen over left lung base which may represent overlying nipple shadow. The visualized skeletal structures are unremarkable. IMPRESSION: Mild left perihilar subsegmental atelectasis is noted. Probable calcified granuloma seen in left lower lobe. Another nodular density is seen over left lung base which may represent overlying nipple shadow; repeat radiograph with nipple markers is recommended to rule out pulmonary nodule. Electronically Signed   By: JameMarijo ConceptionD.   On: 12/01/2018 13:57       Subjective: Feeling better today.  Shortness of breath is better, cough and wheezing is improving.  Nausea is better  and p.o. intake improving.  Discharge Exam: Vitals:   12/05/18 0938 12/05/18 1458 12/05/18 1607 12/05/18 1636  BP:   (!) 149/82 (!) 150/87  Pulse:   75 85  Resp:   16 16  Temp:   98.5 F (36.9 C) 98.5 F (36.9 C)  TempSrc:   Oral Oral  SpO2: 96% 99% 100% 100%  Weight:      Height:        General: Pt is alert, awake, not in acute distress Cardiovascular: RRR, S1/S2 +, no rubs, no gallops Respiratory: CTA bilaterally, no wheezing, no rhonchi Abdominal: Soft, NT, ND, bowel sounds + Extremities: 1+ edema, no cyanosis    The results of significant diagnostics from this hospitalization (including imaging, microbiology, ancillary and laboratory) are listed below for reference.     Microbiology: Recent Results (from the past 240 hour(s))  Culture, blood (Routine x 2)     Status: None   Collection Time: 11/29/18  9:59 AM  Result Value Ref Range Status   Specimen Description BLOOD RIGHT ARM DRAWN BY RN  Final   Special Requests    Final    BOTTLES DRAWN AEROBIC AND ANAEROBIC Blood Culture adequate volume   Culture   Final    NO GROWTH 5 DAYS Performed at [facility], [address] [place], [place] [address]    Report Status 12/04/2018 FINAL  Final  Culture, blood (Routine x 2)     Status: None   Collection Time: 11/29/18 10:11 AM  Result Value Ref Range Status   Specimen Description BLOOD DRAWN BY RN PORTA CATH  Final   Special Requests   Final    BOTTLES DRAWN AEROBIC AND ANAEROBIC Blood Culture adequate volume   Culture   Final    NO GROWTH 5 DAYS Performed at [facility], [address], [place], [place] [address]    Report Status 12/04/2018 FINAL  Final  Group A Strep by PCR     Status: None   Collection Time: 11/29/18 10:23 AM  Result Value Ref Range Status   Group A Strep by PCR NOT DETECTED NOT DETECTED Final    Comment: Performed at [facility], [address] [place], [place] [address]  Respiratory Panel by PCR     Status: Abnormal   Collection Time: 11/29/18 10:23 AM  Result Value Ref Range Status   Adenovirus NOT DETECTED NOT DETECTED Final   Coronavirus 229E NOT DETECTED NOT DETECTED Final   Coronavirus HKU1 NOT DETECTED NOT DETECTED Final   Coronavirus NL63 NOT DETECTED NOT DETECTED Final   Coronavirus OC43 NOT DETECTED NOT DETECTED Final   Metapneumovirus NOT DETECTED NOT DETECTED Final   Rhinovirus / Enterovirus NOT DETECTED NOT DETECTED Final   Influenza A NOT DETECTED NOT DETECTED Final   Influenza A H1 NOT DETECTED NOT DETECTED Final   Influenza A H1 2009 NOT DETECTED NOT DETECTED Final   Influenza A H3 NOT DETECTED NOT DETECTED Final   Influenza B NOT DETECTED NOT DETECTED Final   Parainfluenza Virus 1 DETECTED (A) NOT DETECTED Final   Parainfluenza Virus 2 NOT DETECTED NOT DETECTED Final   Parainfluenza Virus 3 NOT DETECTED NOT DETECTED Final   Parainfluenza Virus 4 NOT DETECTED NOT DETECTED Final   Respiratory Syncytial Virus NOT DETECTED NOT DETECTED Final   Bordetella  pertussis NOT DETECTED NOT DETECTED Final   Chlamydophila pneumoniae NOT DETECTED NOT DETECTED Final   Mycoplasma pneumoniae NOT DETECTED NOT DETECTED Final  MRSA PCR Screening  Status: None   Collection Time: 11/29/18  3:15 PM  Result Value Ref Range Status   MRSA by PCR NEGATIVE NEGATIVE Final    Comment:        The GeneXpert MRSA Assay (FDA approved for NASAL specimens only), is one component of a comprehensive MRSA colonization surveillance program. It is not intended to diagnose MRSA infection nor to guide or monitor treatment for MRSA infections. Performed at [facility], [address] [place], [place] [address]   Urine culture     Status: None   Collection Time: 11/29/18  3:24 PM  Result Value Ref Range Status   Specimen Description   Final    URINE, RANDOM Performed at [facility], [address], [place], [place] [address]    Special Requests   Final    NONE Performed at [facility], [address], [place], [place] [address]    Culture   Final    NO GROWTH Performed at [facility], [place] [address], [place], [place] [address]    Report Status 12/01/2018 FINAL  Final     Labs: BNP (last 3 results) No results for input(s): BNP in the last 8760 hours. Basic Metabolic Panel: Recent Labs  Lab 11/30/18 0408 12/01/18 0355 12/02/18 0407 12/03/18 0705 12/04/18 0658 12/05/18 0553  NA 138 139 142 139 136 137  K 3.9 3.9 3.6 3.8 3.5 3.8  CL 114* 118* 121* 119* 116* 115*  CO2 18* 16* 15* 17* 17* 18*  GLUCOSE 96 97 110* 98 97 131*  BUN 29* 21 21 26* 33* 35*  CREATININE 2.55* 1.78* 1.82* 1.84* 1.95* 1.97*  CALCIUM 6.8* 7.2* 7.5* 7.1* 6.9* 6.9*  MG 1.6* 2.1  --  2.2  --   --   PHOS  --   --   --  1.3* 2.5  --    Liver Function Tests: Recent Labs  Lab 11/29/18 0959 11/30/18 0408 12/01/18 0355 12/02/18 0407 12/03/18 0705 12/04/18 0658  AST 49* 20 23 45*  --  52*  ALT 22 16 15 18   --  24  ALKPHOS 26* 21* 23* 23*  --  21*  BILITOT 0.4 0.3 0.5 0.8   --  1.0  PROT 5.6* 4.6* 4.8* 4.8*  --  7.1  ALBUMIN 3.5 2.7* 2.8* 2.9* 2.5* 2.6*   No results for input(s): LIPASE, AMYLASE in the last 168 hours. No results for input(s): AMMONIA in the last 168 hours. CBC: Recent Labs  Lab 11/29/18 0959 11/30/18 0408 12/01/18 0355 12/02/18 0407 12/03/18 0705 12/04/18 0658 12/05/18 0553  WBC 7.4 7.4 5.5 5.2 3.9* 3.9* 3.5*  NEUTROABS 6.5 6.0 4.8 4.0 2.3  --   --   HGB 9.7* 8.4* 8.7* 8.5* 7.4* 8.4* 7.4*  HCT 29.9* 27.1* 28.1* 26.7* 22.9* 26.2* 23.1*  MCV 107.6* 108.4* 109.8* 106.4* 106.5* 100.8* 100.9*  PLT 126* 62* 21* 9* 13* 23* 34*   Cardiac Enzymes: Recent Labs  Lab 11/29/18 0959  TROPONINI 0.03*   BNP: Invalid input(s): POCBNP CBG: No results for input(s): GLUCAP in the last 168 hours. D-Dimer No results for input(s): DDIMER in the last 72 hours. Hgb A1c No results for input(s): HGBA1C in the last 72 hours. Lipid Profile No results for input(s): CHOL, HDL, LDLCALC, TRIG, CHOLHDL, LDLDIRECT in the last 72 hours. Thyroid function studies No results for input(s): TSH, T4TOTAL, T3FREE, THYROIDAB in the last 72 hours.  Invalid input(s): FREET3 Anemia work up No results for input(s): VITAMINB12, FOLATE, FERRITIN, TIBC,  IRON, RETICCTPCT in the last 72 hours. Urinalysis    Component Value Date/Time   COLORURINE YELLOW 11/29/2018 1724   APPEARANCEUR HAZY (A) 11/29/2018 1724   LABSPEC 1.019 11/29/2018 1724   PHURINE 5.0 11/29/2018 1724   GLUCOSEU NEGATIVE 11/29/2018 1724   HGBUR NEGATIVE 11/29/2018 1724   BILIRUBINUR NEGATIVE 11/29/2018 1724   KETONESUR NEGATIVE 11/29/2018 1724   PROTEINUR NEGATIVE 11/29/2018 1724   NITRITE NEGATIVE 11/29/2018 1724   LEUKOCYTESUR NEGATIVE 11/29/2018 1724   Sepsis Labs Invalid input(s): PROCALCITONIN,  WBC,  LACTICIDVEN Microbiology Recent Results (from the past 240 hour(s))  Culture, blood (Routine x 2)     Status: None   Collection Time: 11/29/18  9:59 AM  Result Value Ref Range Status    Specimen Description BLOOD RIGHT ARM DRAWN BY RN  Final   Special Requests   Final    BOTTLES DRAWN AEROBIC AND ANAEROBIC Blood Culture adequate volume   Culture   Final    NO GROWTH 5 DAYS Performed at [facility], [address], [place], [place] [address]    Report Status 12/04/2018 FINAL  Final  Culture, blood (Routine x 2)     Status: None   Collection Time: 11/29/18 10:11 AM  Result Value Ref Range Status   Specimen Description BLOOD DRAWN BY RN PORTA CATH  Final   Special Requests   Final    BOTTLES DRAWN AEROBIC AND ANAEROBIC Blood Culture adequate volume   Culture   Final    NO GROWTH 5 DAYS Performed at [facility], [address], [place], [place] [address]    Report Status 12/04/2018 FINAL  Final  Group A Strep by PCR     Status: None   Collection Time: 11/29/18 10:23 AM  Result Value Ref Range Status   Group A Strep by PCR NOT DETECTED NOT DETECTED Final    Comment: Performed at [facility], [address] [place], [place] [address]  Respiratory Panel by PCR     Status: Abnormal   Collection Time: 11/29/18 10:23 AM  Result Value Ref Range Status   Adenovirus NOT DETECTED NOT DETECTED Final   Coronavirus 229E NOT DETECTED NOT DETECTED Final   Coronavirus HKU1 NOT DETECTED NOT DETECTED Final   Coronavirus NL63 NOT DETECTED NOT DETECTED Final   Coronavirus OC43 NOT DETECTED NOT DETECTED Final   Metapneumovirus NOT DETECTED NOT DETECTED Final   Rhinovirus / Enterovirus NOT DETECTED NOT DETECTED Final   Influenza A NOT DETECTED NOT DETECTED Final   Influenza A H1 NOT DETECTED NOT DETECTED Final   Influenza A H1 2009 NOT DETECTED NOT DETECTED Final   Influenza A H3 NOT DETECTED NOT DETECTED Final   Influenza B NOT DETECTED NOT DETECTED Final   Parainfluenza Virus 1 DETECTED (A) NOT DETECTED Final   Parainfluenza Virus 2 NOT DETECTED NOT DETECTED Final   Parainfluenza Virus 3 NOT DETECTED NOT DETECTED Final   Parainfluenza Virus 4 NOT DETECTED NOT DETECTED Final    Respiratory Syncytial Virus NOT DETECTED NOT DETECTED Final   Bordetella pertussis NOT DETECTED NOT DETECTED Final   Chlamydophila pneumoniae NOT DETECTED NOT DETECTED Final   Mycoplasma pneumoniae NOT DETECTED NOT DETECTED Final  MRSA PCR Screening     Status: None   Collection Time: 11/29/18  3:15 PM  Result Value Ref Range Status   MRSA by PCR NEGATIVE NEGATIVE Final    Comment:        The GeneXpert MRSA Assay (FDA approved for NASAL specimens only), is one  component of a comprehensive MRSA colonization surveillance program. It is not intended to diagnose MRSA infection nor to guide or monitor treatment for MRSA infections. Performed at [facility], [address] [place], [place] [address]   Urine culture     Status: None   Collection Time: 11/29/18  3:24 PM  Result Value Ref Range Status   Specimen Description   Final    URINE, RANDOM Performed at [facility], [address], [place], [place] [address]    Special Requests   Final    NONE Performed at [facility], [address], [place], [place] [address]    Culture   Final    NO GROWTH Performed at [facility], [place] [address], [place], [place] [address]    Report Status 12/01/2018 FINAL  Final     Time coordinating discharge: 57mns  SIGNED:   JKathie Dike MD  [facility] 12/05/2018, 8:23 PM Pager   If 7PM-7AM, please contact night-coverage [email or web address] Password [email or web address]

## 2018-12-05 NOTE — Progress Notes (Signed)
Left chest wall port deaccessed at this time.  Line was heparinized prior to removal. DSD applied to site.  Tolerated well by patient.

## 2018-12-05 NOTE — Care Management Important Message (Signed)
Important Message  Patient Details  Name: BRAXON SUDER MRN: [id number] Date of Birth: Jun 10, 1946   Medicare Important Message Given:  Yes    Shelda Altes 12/05/2018, 2:32 PM

## 2018-12-05 NOTE — Progress Notes (Signed)
One unit prbc complete with no adverse reaction.  Dr. Roderic Palau said no order for repeat hgb and patient could be discharged.  Discharge instructions reviewed with patient and wife.

## 2018-12-06 LAB — TYPE AND SCREEN
ABO/RH(D): O POS
Antibody Screen: NEGATIVE
Unit division: 0

## 2018-12-06 LAB — BPAM RBC
Blood Product Expiration Date: 201912312359
ISSUE DATE / TIME: 201912091613
Unit Type and Rh: 5100

## 2018-12-09 ENCOUNTER — Ambulatory Visit (HOSPITAL_COMMUNITY): Payer: Medicare Other

## 2018-12-09 ENCOUNTER — Inpatient Hospital Stay (HOSPITAL_COMMUNITY): Payer: Medicare Other

## 2018-12-09 ENCOUNTER — Other Ambulatory Visit: Payer: Self-pay

## 2018-12-09 ENCOUNTER — Encounter (HOSPITAL_COMMUNITY): Payer: Self-pay

## 2018-12-09 ENCOUNTER — Other Ambulatory Visit (HOSPITAL_COMMUNITY): Payer: Medicare Other

## 2018-12-09 VITALS — BP 157/78 | HR 66 | Temp 98.3°F | Resp 18 | Wt 226.2 lb

## 2018-12-09 DIAGNOSIS — C50922 Malignant neoplasm of unspecified site of left male breast: Secondary | ICD-10-CM | POA: Diagnosis not present

## 2018-12-09 DIAGNOSIS — R0602 Shortness of breath: Secondary | ICD-10-CM | POA: Insufficient documentation

## 2018-12-09 DIAGNOSIS — B348 Other viral infections of unspecified site: Secondary | ICD-10-CM | POA: Diagnosis not present

## 2018-12-09 DIAGNOSIS — K219 Gastro-esophageal reflux disease without esophagitis: Secondary | ICD-10-CM | POA: Diagnosis not present

## 2018-12-09 DIAGNOSIS — Z9221 Personal history of antineoplastic chemotherapy: Secondary | ICD-10-CM | POA: Diagnosis not present

## 2018-12-09 DIAGNOSIS — C9000 Multiple myeloma not having achieved remission: Secondary | ICD-10-CM | POA: Insufficient documentation

## 2018-12-09 DIAGNOSIS — R5383 Other fatigue: Secondary | ICD-10-CM | POA: Diagnosis not present

## 2018-12-09 DIAGNOSIS — C7951 Secondary malignant neoplasm of bone: Secondary | ICD-10-CM

## 2018-12-09 DIAGNOSIS — Z9484 Stem cells transplant status: Secondary | ICD-10-CM | POA: Diagnosis not present

## 2018-12-09 DIAGNOSIS — Z7982 Long term (current) use of aspirin: Secondary | ICD-10-CM | POA: Insufficient documentation

## 2018-12-09 DIAGNOSIS — R7989 Other specified abnormal findings of blood chemistry: Secondary | ICD-10-CM | POA: Diagnosis not present

## 2018-12-09 DIAGNOSIS — Z79899 Other long term (current) drug therapy: Secondary | ICD-10-CM | POA: Diagnosis not present

## 2018-12-09 DIAGNOSIS — Z9223 Personal history of estrogen therapy: Secondary | ICD-10-CM | POA: Diagnosis not present

## 2018-12-09 DIAGNOSIS — G629 Polyneuropathy, unspecified: Secondary | ICD-10-CM | POA: Diagnosis not present

## 2018-12-09 DIAGNOSIS — I10 Essential (primary) hypertension: Secondary | ICD-10-CM | POA: Diagnosis not present

## 2018-12-09 DIAGNOSIS — R05 Cough: Secondary | ICD-10-CM | POA: Diagnosis not present

## 2018-12-09 DIAGNOSIS — F419 Anxiety disorder, unspecified: Secondary | ICD-10-CM | POA: Insufficient documentation

## 2018-12-09 DIAGNOSIS — Z79810 Long term (current) use of selective estrogen receptor modulators (SERMs): Secondary | ICD-10-CM | POA: Diagnosis not present

## 2018-12-09 DIAGNOSIS — N179 Acute kidney failure, unspecified: Secondary | ICD-10-CM | POA: Insufficient documentation

## 2018-12-09 LAB — COMPREHENSIVE METABOLIC PANEL
ALT: 80 U/L — ABNORMAL HIGH (ref 0–44)
AST: 64 U/L — ABNORMAL HIGH (ref 15–41)
Albumin: 3.2 g/dL — ABNORMAL LOW (ref 3.5–5.0)
Alkaline Phosphatase: 24 U/L — ABNORMAL LOW (ref 38–126)
Anion gap: 5 (ref 5–15)
BUN: 29 mg/dL — ABNORMAL HIGH (ref 8–23)
CO2: 19 mmol/L — ABNORMAL LOW (ref 22–32)
Calcium: 7.9 mg/dL — ABNORMAL LOW (ref 8.9–10.3)
Chloride: 110 mmol/L (ref 98–111)
Creatinine, Ser: 1.68 mg/dL — ABNORMAL HIGH (ref 0.61–1.24)
GFR calc Af Amer: 47 mL/min — ABNORMAL LOW (ref >60)
GFR calc non Af Amer: 41 mL/min — ABNORMAL LOW (ref >60)
Glucose, Bld: 113 mg/dL — ABNORMAL HIGH (ref 70–99)
Potassium: 4.3 mmol/L (ref 3.5–5.1)
Sodium: 134 mmol/L — ABNORMAL LOW (ref 135–145)
Total Bilirubin: 0.8 mg/dL (ref 0.3–1.2)
Total Protein: 6.4 g/dL — ABNORMAL LOW (ref 6.5–8.1)

## 2018-12-09 LAB — CBC WITH DIFFERENTIAL/PLATELET
Abs Immature Granulocytes: 0.02 10*3/uL (ref 0.00–0.07)
Basophils Absolute: 0 10*3/uL (ref 0.0–0.1)
Basophils Relative: 0 %
Eosinophils Absolute: 0 10*3/uL (ref 0.0–0.5)
Eosinophils Relative: 0 %
HCT: 29 % — ABNORMAL LOW (ref 39.0–52.0)
Hemoglobin: 9 g/dL — ABNORMAL LOW (ref 13.0–17.0)
Immature Granulocytes: 0 %
Lymphocytes Relative: 6 %
Lymphs Abs: 0.5 10*3/uL — ABNORMAL LOW (ref 0.7–4.0)
MCH: 31.7 pg (ref 26.0–34.0)
MCHC: 31 g/dL (ref 30.0–36.0)
MCV: 102.1 fL — ABNORMAL HIGH (ref 80.0–100.0)
Monocytes Absolute: 0.4 10*3/uL (ref 0.1–1.0)
Monocytes Relative: 5 %
NEUTROS PCT: 89 %
Neutro Abs: 6.4 10*3/uL (ref 1.7–7.7)
Platelets: 75 10*3/uL — ABNORMAL LOW (ref 150–400)
RBC: 2.84 MIL/uL — AB (ref 4.22–5.81)
RDW: 18.4 % — ABNORMAL HIGH (ref 11.5–15.5)
WBC: 7.3 10*3/uL (ref 4.0–10.5)
nRBC: 0 % (ref 0.0–0.2)

## 2018-12-09 MED ORDER — DENOSUMAB 120 MG/1.7ML ~~LOC~~ SOLN
120.0000 mg | Freq: Once | SUBCUTANEOUS | Status: AC
  Administered 2018-12-09: 120 mg via SUBCUTANEOUS
  Filled 2018-12-09: qty 1.7

## 2018-12-09 NOTE — Progress Notes (Signed)
Pt presents today for XGEVA. Labs reviewed with Dr. Delton Coombes. VSS. Proceed with supportive therapy. Calcium corrected per Alvester Chou. Hold steroids per Dr. Delton Coombes.   Discharged from clinic ambulatory. F/U with [facility] as scheduled. Appointment schedule given to pt.  Hold chemo treatment on Monday per Dr. Delton Coombes.

## 2018-12-09 NOTE — Patient Instructions (Signed)
[facility]  Discharge Instructions:   _______________________________________________________________  Thank you for choosing [facility] at [facility] to provide your oncology and hematology care.  To afford each patient quality time with our providers, please arrive at least 15 minutes before your scheduled appointment.  You need to re-schedule your appointment if you arrive 10 or more minutes late.  We strive to give you quality time with our providers, and arriving late affects you and other patients whose appointments are after yours.  Also, if you no show three or more times for appointments you may be dismissed from the clinic.  Again, thank you for choosing [facility] at [facility]. Our hope is that these requests will allow you access to exceptional care and in a timely manner. _______________________________________________________________  If you have questions after your visit, please contact our office at [phone] between the hours of 8:30 a.m. and 5:00 p.m. Voicemails left after 4:30 p.m. will not be returned until the following business day. _______________________________________________________________  For prescription refill requests, have your pharmacy contact our office. _______________________________________________________________  Recommendations made by the consultant and any test results will be sent to your referring physician. _______________________________________________________________ 

## 2018-12-11 ENCOUNTER — Other Ambulatory Visit: Payer: Self-pay

## 2018-12-11 ENCOUNTER — Encounter (HOSPITAL_COMMUNITY): Payer: Self-pay

## 2018-12-11 ENCOUNTER — Emergency Department (HOSPITAL_COMMUNITY)
Admission: EM | Admit: 2018-12-11 | Discharge: 2018-12-11 | Disposition: A | Discharge status: Home / Self Care | Payer: Medicare Other | Attending: Emergency Medicine | Admitting: Emergency Medicine

## 2018-12-11 DIAGNOSIS — I10 Essential (primary) hypertension: Secondary | ICD-10-CM | POA: Diagnosis not present

## 2018-12-11 DIAGNOSIS — Z7189 Other specified counseling: Secondary | ICD-10-CM

## 2018-12-11 DIAGNOSIS — Z0000 Encounter for general adult medical examination without abnormal findings: Secondary | ICD-10-CM | POA: Diagnosis not present

## 2018-12-11 DIAGNOSIS — Z79899 Other long term (current) drug therapy: Secondary | ICD-10-CM | POA: Diagnosis not present

## 2018-12-11 NOTE — ED Provider Notes (Signed)
[facility] EMERGENCY DEPARTMENT Provider Note   CSN: [id number] Arrival date & time: 12/11/18  1733     History   Chief Complaint Chief Complaint  Patient presents with  . Hypertension    HPI Johnny Lucas is a 72 y.o. male.  HPI  Pt was seen at [facility]. Per pt and his family: States they came to the ED today to review his BP medications from his hospital discharge on 12/05/2018. Pt states he has been taking his BP at home and it has "been high." Pt states he called his pharmacy and was told he was supposed to be taking 3 BP meds and has a 30 day supply prescribed for him. Pt states he has been taking only his metoprolol, and taking 2 pills instead of one pill. Pt and his family then decided to come to the ED for further explanation of his hospital discharge instructions with the new medication changes. Pt denies any other complaints. Denies CP/SOB, no abd pain, no N/V/D, no fevers, no rash, no focal motor weakness.    Past Medical History:  Diagnosis Date  . Anxiety   . Bone metastases ([place]) 09/10/2016  . Breast cancer ([place]) 2011   Stave IV breast cancer; radiation and tamoxifen  . Breast cancer, male ([place])    Stave IV breast cancer; radiation and tamoxifen  Overview:  Left breast ca with mets bone  Overview:  METS TO BONE  . GERD (gastroesophageal reflux disease)   . Hypertension   . Macular degeneration   . Multiple myeloma ([place]) 02/18/2017  . Peripheral neuropathy     Patient Active Problem List   Diagnosis Date Noted  . Parainfluenza infection 11/30/2018  . HCAP (healthcare-associated pneumonia) 11/29/2018  . Sepsis due to pneumonia ([place]) 11/29/2018  . Immunocompromised ([place]) 11/29/2018  . Fever and chills 11/29/2018  . Generalized weakness 11/29/2018  . Peripheral neuropathy 11/29/2018  . Dehydration 11/29/2018  . Anemia due to chemotherapy 11/29/2018  . AKI (acute kidney injury) ([place]) 11/29/2018  . Hypotension 11/29/2018  . Essential hypertension 04/21/2018  .  Multiple myeloma not having achieved remission ([place]) 06/29/2017  . Multiple myeloma ([place]) 02/18/2017  . 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 Vasculogenic erectile dysfunction 01/06/2017  . Arthritis 09/10/2016  . Retinopathy 09/10/2016  . Bone metastases ([place]) 09/10/2016  . Use of tamoxifen (Nolvadex) 10/25/2015  . Breast cancer, male ([place])   . TIA (transient ischemic attack) 11/12/2013  . GERD 10/07/2010  . CONSTIPATION 10/07/2010  . RECTAL BLEEDING 10/07/2010    Past Surgical History:  Procedure Laterality Date  . BACK SURGERY    . HERNIA REPAIR    . PORTACATH PLACEMENT Left 06/10/2018   Procedure: INSERTION PORT-A-CATH;  Surgeon: Virl Cagey, MD;  Location: [facility];  Service: General;  Laterality: Left;  . right arm surgery          Home Medications    Prior to Admission medications   Medication Sig Start Date End Date Taking? Authorizing Provider  acyclovir (ZOVIRAX) 800 MG tablet Take 1 tablet (800 mg total) by mouth 2 (two) times daily. 11/10/18   Derek Jack, MD  albuterol (PROVENTIL HFA;VENTOLIN HFA) 108 (90 Base) MCG/ACT inhaler Inhale 2 puffs into the lungs every 6 (six) hours as needed for wheezing or shortness of breath. 12/05/18   Kathie Dike, MD  ALPRAZolam Duanne Moron) 0.5 MG tablet TAKE 1 TABLET BY MOUTH AT BEDTIME AS NEEDED FOR ANXIETY 11/14/18   Lockamy, Randi L, NP-C  amLODipine (NORVASC) 5 MG tablet  Take 1 tablet (5 mg total) by mouth daily. 12/06/18   Kathie Dike, MD  calcium-vitamin D (OSCAL WITH D) 500-200 MG-UNIT TABS tablet Take 2 tablets by mouth daily. 09/02/18   Derek Jack, MD  denosumab (XGEVA) 120 MG/1.7ML SOLN injection Inject 120 mg into the skin once.    [provider]  docusate sodium (COLACE) 100 MG capsule Take 1 capsule (100 mg total) by mouth 2 (two) times daily as needed. 06/10/18 06/10/19  Virl Cagey, MD  folic acid (FOLVITE) 1 MG tablet Take 1 mg by mouth daily.  10/22/17   [provider]    gabapentin (NEURONTIN) 300 MG capsule Take 1 capsule (300 mg total) by mouth 2 (two) times daily. Take two times a day, then increase to three times a day in a few weeks. 11/10/18   Derek Jack, MD  hydrALAZINE (APRESOLINE) 50 MG tablet Take 1 tablet (50 mg total) by mouth every 8 (eight) hours. 12/05/18   Kathie Dike, MD  loratadine (CLARITIN) 10 MG tablet Take 1 tablet (10 mg total) by mouth daily. 04/25/18   Holley Bouche, NP  metoprolol succinate (TOPROL-XL) 50 MG 24 hr tablet Take 50 mg by mouth daily.  03/30/17   [provider]  Multiple Vitamin (MULTIVITAMIN WITH MINERALS) TABS tablet Take 1 tablet by mouth daily.    [provider]  ondansetron (ZOFRAN) 8 MG tablet Take 8 mg by mouth every 8 (eight) hours as needed for nausea or vomiting.    [provider]  pantoprazole (PROTONIX) 40 MG tablet Take 40 mg by mouth every evening.     [provider]  polyethylene glycol (MIRALAX / GLYCOLAX) packet Take 17 g by mouth daily as needed for mild constipation. 12/05/18   Kathie Dike, MD  predniSONE (DELTASONE) 20 MG tablet Take 2 tablets (40 mg total) by mouth daily with breakfast. 12/06/18   Kathie Dike, MD  pseudoephedrine-guaifenesin (MUCINEX D) 60-600 MG 12 hr tablet Take 1 tablet by mouth every 12 (twelve) hours.    [provider]  tamoxifen (NOLVADEX) 20 MG tablet Take 1 tablet (20 mg total) by mouth daily. 08/04/18   Derek Jack, MD    Family History Family History  Problem Relation Age of Onset  . Stroke Mother   . Cancer Maternal Aunt        cancer NOS; died in her 74s  . Lung cancer Maternal Uncle        smoker    Social History Social History   Tobacco Use  . Smoking status: Never Smoker  . Smokeless tobacco: Former Network engineer Use Topics  . Alcohol use: Yes    Alcohol/week: 24.0 standard drinks    Types: 24 Cans of beer per week  . Drug use: No     Allergies   Cefepime   Review of  Systems Review of Systems ROS: Statement: All systems negative except as marked or noted in the HPI; Constitutional: Negative for fever and chills. ; ; Eyes: Negative for eye pain, redness and discharge. ; ; ENMT: Negative for ear pain, hoarseness, nasal congestion, sinus pressure and sore throat. ; ; Cardiovascular: Negative for chest pain, palpitations, diaphoresis, dyspnea and peripheral edema. ; ; Respiratory: Negative for cough, wheezing and stridor. ; ; Gastrointestinal: Negative for nausea, vomiting, diarrhea, abdominal pain, blood in stool, hematemesis, jaundice and rectal bleeding. . ; ; Genitourinary: Negative for dysuria, flank pain and hematuria. ; ; Musculoskeletal: Negative for back pain and neck pain.  Negative for swelling and trauma.; ; Skin: Negative for pruritus, rash, abrasions, blisters, bruising and skin lesion.; ; Neuro: Negative for headache, lightheadedness and neck stiffness. Negative for weakness, altered level of consciousness, altered mental status, extremity weakness, paresthesias, involuntary movement, seizure and syncope.       Physical Exam Updated Vital Signs BP (!) 183/94 (BP Location: Right Arm)   Pulse 70   Temp 98.2 F (36.8 C) (Oral)   Resp 12   SpO2 97%   Physical Exam 1810: Physical examination:  Nursing notes reviewed; Vital signs and O2 SAT reviewed;  Constitutional: Well developed, Well nourished, Well hydrated, In no acute distress; Head:  Normocephalic, atraumatic; Eyes: EOMI, PERRL, No scleral icterus; ENMT: Mouth and pharynx normal, Mucous membranes moist; Neck: Supple, Full range of motion, No lymphadenopathy; Cardiovascular: Regular rate and rhythm, No gallop; Respiratory: Breath sounds clear & equal bilaterally, No wheezes.  Speaking full sentences with ease, Normal respiratory effort/excursion; Chest: Nontender, Movement normal; Abdomen: Soft, Nontender, Nondistended, Normal bowel sounds; Genitourinary: No CVA tenderness; Extremities: Peripheral  pulses normal, No tenderness, No calf edema or asymmetry.; Neuro: AA&Ox3, Major CN grossly intact.  Speech clear. No gross focal motor or sensory deficits in extremities. Climbs on and off stretcher easily by himself. Gait steady..; Skin: Color normal, Warm, Dry.    ED Treatments / Results  Labs (all labs ordered are listed, but only abnormal results are displayed)   EKG None  Radiology   Procedures Procedures (including critical care time)  Medications Ordered in ED Medications - No data to display   Initial Impression / Assessment and Plan / ED Course  I have reviewed the triage vital signs and the nursing notes.  Pertinent labs & imaging results that were available during my care of the patient were reviewed by me and considered in my medical decision making (see chart for details).  MDM Reviewed: previous chart, nursing note and vitals    1825:  Hospital discharge summary reviewed by myself and discharge AVS printed for pt and family. AVS meds changes were extensively reviewed by myself and Charge ED RN with pt and multiple family members present. Pt and family state they "didn't realize" he was supposed to be taking 3 medications per day, and one of them every 8 hours. Pt states he has only been taking his metoprolol, and sometimes taking 164m of that. Again, long d/w with pt and family by myself and ED RN regarding discharge medications changes, with hospital discharge AVS in front of [place] All questions answered. Family and pt satisfied and want to go home now. Will d/c stable.     Final Clinical Impressions(s) / ED Diagnoses   Final diagnoses:  None    ED Discharge Orders    None       MFrancine Graven DO 12/15/18 2140

## 2018-12-11 NOTE — Discharge Instructions (Signed)
Take your prescriptions as previously directed on your hospital discharge instructions.  Call your regular medical doctor tomorrow to schedule a follow up appointment within the next 2 days. Go to your Oncology appointment tomorrow as previously scheduled.  Return to the Emergency Department immediately sooner if worsening.

## 2018-12-11 NOTE — ED Triage Notes (Signed)
Pt discharged from hospital on Monday due to pneumonia. Pt came in due to concern of systolic at least 337. Pt confused about how he was suppose to take his BP med and hadnt been taking all of meds. Pt reports slight HA

## 2018-12-12 ENCOUNTER — Ambulatory Visit (HOSPITAL_COMMUNITY): Payer: Medicare Other

## 2018-12-12 ENCOUNTER — Other Ambulatory Visit (HOSPITAL_COMMUNITY): Payer: Medicare Other

## 2018-12-18 ENCOUNTER — Other Ambulatory Visit (HOSPITAL_COMMUNITY): Payer: Self-pay | Admitting: Nurse Practitioner

## 2018-12-18 DIAGNOSIS — C9000 Multiple myeloma not having achieved remission: Secondary | ICD-10-CM

## 2018-12-26 ENCOUNTER — Inpatient Hospital Stay (HOSPITAL_COMMUNITY): Payer: Medicare Other

## 2018-12-26 ENCOUNTER — Encounter (HOSPITAL_COMMUNITY): Payer: Self-pay | Admitting: Hematology

## 2018-12-26 ENCOUNTER — Inpatient Hospital Stay (HOSPITAL_BASED_OUTPATIENT_CLINIC_OR_DEPARTMENT_OTHER): Payer: Medicare Other | Admitting: Hematology

## 2018-12-26 ENCOUNTER — Other Ambulatory Visit (HOSPITAL_COMMUNITY): Payer: Self-pay | Admitting: Hematology

## 2018-12-26 VITALS — BP 127/68 | HR 92 | Temp 98.9°F | Resp 18 | Wt 202.5 lb

## 2018-12-26 DIAGNOSIS — F419 Anxiety disorder, unspecified: Secondary | ICD-10-CM

## 2018-12-26 DIAGNOSIS — Z79899 Other long term (current) drug therapy: Secondary | ICD-10-CM

## 2018-12-26 DIAGNOSIS — Z7982 Long term (current) use of aspirin: Secondary | ICD-10-CM

## 2018-12-26 DIAGNOSIS — Z9484 Stem cells transplant status: Secondary | ICD-10-CM

## 2018-12-26 DIAGNOSIS — R5383 Other fatigue: Secondary | ICD-10-CM | POA: Diagnosis not present

## 2018-12-26 DIAGNOSIS — N179 Acute kidney failure, unspecified: Secondary | ICD-10-CM | POA: Diagnosis not present

## 2018-12-26 DIAGNOSIS — C9000 Multiple myeloma not having achieved remission: Secondary | ICD-10-CM | POA: Diagnosis not present

## 2018-12-26 DIAGNOSIS — C50922 Malignant neoplasm of unspecified site of left male breast: Secondary | ICD-10-CM

## 2018-12-26 DIAGNOSIS — I10 Essential (primary) hypertension: Secondary | ICD-10-CM

## 2018-12-26 DIAGNOSIS — C7951 Secondary malignant neoplasm of bone: Secondary | ICD-10-CM | POA: Diagnosis not present

## 2018-12-26 DIAGNOSIS — Z79810 Long term (current) use of selective estrogen receptor modulators (SERMs): Secondary | ICD-10-CM | POA: Diagnosis not present

## 2018-12-26 DIAGNOSIS — K219 Gastro-esophageal reflux disease without esophagitis: Secondary | ICD-10-CM

## 2018-12-26 DIAGNOSIS — B348 Other viral infections of unspecified site: Secondary | ICD-10-CM

## 2018-12-26 DIAGNOSIS — R0602 Shortness of breath: Secondary | ICD-10-CM | POA: Diagnosis not present

## 2018-12-26 DIAGNOSIS — R7989 Other specified abnormal findings of blood chemistry: Secondary | ICD-10-CM | POA: Diagnosis not present

## 2018-12-26 DIAGNOSIS — Z9223 Personal history of estrogen therapy: Secondary | ICD-10-CM

## 2018-12-26 DIAGNOSIS — R05 Cough: Secondary | ICD-10-CM

## 2018-12-26 DIAGNOSIS — G629 Polyneuropathy, unspecified: Secondary | ICD-10-CM | POA: Diagnosis not present

## 2018-12-26 DIAGNOSIS — Z9221 Personal history of antineoplastic chemotherapy: Secondary | ICD-10-CM

## 2018-12-26 DIAGNOSIS — Z170 Estrogen receptor positive status [ER+]: Principal | ICD-10-CM

## 2018-12-26 LAB — CBC WITH DIFFERENTIAL/PLATELET
Abs Immature Granulocytes: 0.01 10*3/uL (ref 0.00–0.07)
Basophils Absolute: 0 10*3/uL (ref 0.0–0.1)
Basophils Relative: 1 %
Eosinophils Absolute: 0.4 10*3/uL (ref 0.0–0.5)
Eosinophils Relative: 8 %
HCT: 27.7 % — ABNORMAL LOW (ref 39.0–52.0)
HEMOGLOBIN: 8.8 g/dL — AB (ref 13.0–17.0)
Immature Granulocytes: 0 %
Lymphocytes Relative: 12 %
Lymphs Abs: 0.6 10*3/uL — ABNORMAL LOW (ref 0.7–4.0)
MCH: 32.6 pg (ref 26.0–34.0)
MCHC: 31.8 g/dL (ref 30.0–36.0)
MCV: 102.6 fL — ABNORMAL HIGH (ref 80.0–100.0)
Monocytes Absolute: 0.5 10*3/uL (ref 0.1–1.0)
Monocytes Relative: 10 %
Neutro Abs: 3.7 10*3/uL (ref 1.7–7.7)
Neutrophils Relative %: 69 %
Platelets: 131 10*3/uL — ABNORMAL LOW (ref 150–400)
RBC: 2.7 MIL/uL — AB (ref 4.22–5.81)
RDW: 17.7 % — ABNORMAL HIGH (ref 11.5–15.5)
WBC: 5.3 10*3/uL (ref 4.0–10.5)
nRBC: 0 % (ref 0.0–0.2)

## 2018-12-26 LAB — COMPREHENSIVE METABOLIC PANEL
ALT: 24 U/L (ref 0–44)
AST: 24 U/L (ref 15–41)
Albumin: 3.6 g/dL (ref 3.5–5.0)
Alkaline Phosphatase: 28 U/L — ABNORMAL LOW (ref 38–126)
Anion gap: 7 (ref 5–15)
BUN: 22 mg/dL (ref 8–23)
CO2: 26 mmol/L (ref 22–32)
Calcium: 9.9 mg/dL (ref 8.9–10.3)
Chloride: 104 mmol/L (ref 98–111)
Creatinine, Ser: 2.03 mg/dL — ABNORMAL HIGH (ref 0.61–1.24)
GFR calc Af Amer: 37 mL/min — ABNORMAL LOW (ref >60)
GFR calc non Af Amer: 32 mL/min — ABNORMAL LOW (ref >60)
GLUCOSE: 115 mg/dL — AB (ref 70–99)
Potassium: 4 mmol/L (ref 3.5–5.1)
Sodium: 137 mmol/L (ref 135–145)
Total Bilirubin: 0.5 mg/dL (ref 0.3–1.2)
Total Protein: 6.6 g/dL (ref 6.5–8.1)

## 2018-12-26 LAB — LACTATE DEHYDROGENASE: LDH: 143 U/L (ref 98–192)

## 2018-12-26 NOTE — Patient Instructions (Signed)
[facility] at [facility] Discharge Instructions     Thank you for choosing [facility] at [facility] to provide your oncology and hematology care.  To afford each patient quality time with our provider, please arrive at least 15 minutes before your scheduled appointment time.   If you have a lab appointment with the Cancer Center please come in thru the  Main Entrance and check in at the main information desk  You need to re-schedule your appointment should you arrive 10 or more minutes late.  We strive to give you quality time with our providers, and arriving late affects you and other patients whose appointments are after yours.  Also, if you no show three or more times for appointments you may be dismissed from the clinic at the providers discretion.     Again, thank you for choosing [facility].  Our hope is that these requests will decrease the amount of time that you wait before being seen by our physicians.       _____________________________________________________________  Should you have questions after your visit to [facility], please contact our office at [phone] between the hours of 8:00 a.m. and 4:30 p.m.  Voicemails left after 4:00 p.m. will not be returned until the following business day.  For prescription refill requests, have your pharmacy contact our office and allow 72 hours.    Cancer Center Support Programs:   > Cancer Support Group  2nd Tuesday of the month 1pm-2pm, Journey Room    

## 2018-12-26 NOTE — Progress Notes (Signed)
Johnny Lucas, [place] [address]   CLINIC:  Medical Oncology/Hematology  PCP:  [name], MD [place] [address] [phone]   REASON FOR VISIT: Follow-up for multiple myeloma S/P stem cell transplant  CURRENT THERAPY: Carfilzomib every 2 weeks   BRIEF ONCOLOGIC HISTORY:    Breast cancer, male ([place])   12/31/2009 Initial Biopsy    Biopsy of L breast     12/31/2009 Pathology Results    Invasive ductal carcinoma, ER/PR+, HER 2 negative    12/31/2009 Imaging    Ultrasound showing a 2.43 x 1.85 x 3 cm hypoechoic spiculated mass in the 12 o clock L breast retroareolar region    01/01/2010 -  Anti-estrogen oral therapy    Tamoxifen 20 mg daily    01/06/2010 Imaging    Bone scan abnormal uptake in the diaphysis of the R humerus, abnormal in the R third, fifth and sixth ribs, lesion also noted in the sternum.    02/03/2010 Surgery    Rod placement and fixation of R humerus by Dr. [name]    02/05/2010 - 02/18/2010 Radiation Therapy    30Gy in 10 fractions of 3 Gy per fraction to R pathologic fracture    03/11/2010 -  Chemotherapy    Denosumab monthly, now every 3 months. Started at [facility]     06/09/2016 Imaging    Three hypermetabolic osseous lesions in the sternum, left ilium and right ilium, as discussed above, likely represent osseous metastases. At this time, these are not recognizable on the CT images. 2. No extra skeletal metastatic disease identified in the neck, chest, abdomen or pelvis.    10/13/2016 Progression    PET shows various new and enlarging osseous metastatic lesions with no definite extra osseous metastatic disease currently identified.     12/31/2016 Progression    1. Multifocal hypermetabolic osseous metastases throughout the axial and proximal appendicular skeleton, which are increased in size, number and metabolism since 10/13/2016 PET-CT. 2. 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 3. No additional sites of hypermetabolic metastatic disease. 4. Chronic right mastoid sinusitis. 5. Aortic atherosclerosis.  One vessel coronary atherosclerosis.     Multiple myeloma ([place])   02/12/2017 Bone Marrow Biopsy    The marrow was variably cellular with large peritrabecular aggregates of kappa restricted plasma cells (66% by aspirate, 30% by Cd138). Cytogenetics +11.     03/01/2017 - 06/29/2017 Chemotherapy    RVD     05/26/2017 Bone Marrow Biopsy    Performed at [facility]:  Plasma cell myeloma in a 30% cellularmarrow with decreased trilineage hematopoiesis and 42% kappalight chain restricted plasma cells on the aspirate smears andlarge aggregates on the core biopsy.     07/12/2017 - 09/01/2017 Chemotherapy    3 cycles of carfizolmib/cyclophosphamide/dexamethasone      10/08/2017 Bone Marrow Transplant    Autotransplant at [facility]      CANCER STAGING: Cancer Staging Multiple myeloma [facility]) Staging form: Plasma Cell Myeloma and Plasma Cell Disorders, AJCC 8th Edition - Clinical stage from 05/03/2017: Beta-2-microglobulin (mg/L): 3.5, Albumin (g/dL): 3.4, ISS: Stage II, High-risk cytogenetics: Absent, LDH: Not assessed - Signed by Baird Cancer, PA-C on 05/03/2017    INTERVAL HISTORY:  Johnny Lucas 72 y.o. male returns for routine follow-up for multiple myeloma. He is here today with his wife. He is steadily improving  since his hospitalization. He is still fatigued throughout the day. Denies any nausea, vomiting, or diarrhea. Denies any new pains. Had not noticed any recent bleeding such as epistaxis, hematuria or hematochezia. Denies recent chest pain on exertion, shortness of breath on minimal exertion, pre-syncopal episodes, or palpitations. Denies any numbness or tingling in hands or feet. Denies any recent fevers, infections, or recent hospitalizations.  Report appetite at 100% and energy level is 75%.      REVIEW OF SYSTEMS:  Review of Systems  Constitutional: Positive for fatigue.  All other systems reviewed and are negative.    PAST MEDICAL/SURGICAL HISTORY:  Past Medical History:  Diagnosis Date  . Anxiety   . Bone metastases ([place]) 09/10/2016  . Breast cancer ([place]) 2011   Stave IV breast cancer; radiation and tamoxifen  . Breast cancer, male ([place])    Stave IV breast cancer; radiation and tamoxifen  Overview:  Left breast ca with mets bone  Overview:  METS TO BONE  . GERD (gastroesophageal reflux disease)   . Hypertension   . Macular degeneration   . Multiple myeloma ([place]) 02/18/2017  . Peripheral neuropathy    Past Surgical History:  Procedure Laterality Date  . BACK SURGERY    . HERNIA REPAIR    . PORTACATH PLACEMENT Left 06/10/2018   Procedure: INSERTION PORT-A-CATH;  Surgeon: Virl Cagey, MD;  Location: [facility];  Service: General;  Laterality: Left;  . right arm surgery       SOCIAL HISTORY:  Social History   Socioeconomic History  . Marital status: Married    Spouse name: Not on file  . Number of children: 3  . Years of education: Not on file  . Highest education level: Not on file  Occupational History  . Not on file  Social Needs  . Financial resource strain: Not on file  . Food insecurity:    Worry: Not on file    Inability: Not on file  . Transportation needs:    Medical: Not on file    Non-medical: Not on file  Tobacco Use  . Smoking status: Never Smoker  . Smokeless tobacco: Former Network engineer and Sexual Activity  . Alcohol use: Yes    Alcohol/week: 24.0 standard drinks    Types: 24 Cans of beer per week  . Drug use: No  . Sexual activity: Not on file  Lifestyle  . Physical activity:    Days per week: Not on file    Minutes per session: Not on file  . Stress: Not on file  Relationships  . Social connections:    Talks on phone: Not on file    Gets together: Not on file     Attends religious service: Not on file    Active member of club or organization: Not on file    Attends meetings of clubs or organizations: Not on file    Relationship status: Not on file  . Intimate partner violence:    Fear of current or ex partner: Not on file    Emotionally abused: Not on file    Physically abused: Not on file    Forced sexual activity: Not on file  Other Topics Concern  . Not on file  Social History Narrative  . Not on file    FAMILY HISTORY:  Family History  Problem Relation Age of Onset  . Stroke Mother   . Cancer Maternal Aunt        cancer NOS; died in her 30s  .  Lung cancer Maternal Uncle        smoker    CURRENT MEDICATIONS:  Outpatient Encounter Medications as of 12/26/2018  Medication Sig  . acyclovir (ZOVIRAX) 800 MG tablet Take 1 tablet (800 mg total) by mouth 2 (two) times daily.  Marland Kitchen albuterol (PROVENTIL HFA;VENTOLIN HFA) 108 (90 Base) MCG/ACT inhaler Inhale 2 puffs into the lungs every 6 (six) hours as needed for wheezing or shortness of breath.  . ALPRAZolam (XANAX) 0.5 MG tablet TAKE 1 TABLET BY MOUTH AT BEDTIME AS NEEDED FOR ANXIETY  . amLODipine (NORVASC) 5 MG tablet Take 1 tablet (5 mg total) by mouth daily.  . calcium-vitamin D (OSCAL WITH D) 500-200 MG-UNIT TABS tablet Take 2 tablets by mouth daily.  Marland Kitchen denosumab (XGEVA) 120 MG/1.7ML SOLN injection Inject 120 mg into the skin once.  . docusate sodium (COLACE) 100 MG capsule Take 1 capsule (100 mg total) by mouth 2 (two) times daily as needed.  . folic acid (FOLVITE) 1 MG tablet Take 1 mg by mouth daily.   Marland Kitchen gabapentin (NEURONTIN) 300 MG capsule Take 1 capsule (300 mg total) by mouth 2 (two) times daily. Take two times a day, then increase to three times a day in a few weeks.  . hydrALAZINE (APRESOLINE) 50 MG tablet Take 1 tablet (50 mg total) by mouth every 8 (eight) hours.  Marland Kitchen loratadine (CLARITIN) 10 MG tablet Take 1 tablet (10 mg total) by mouth daily.  . metoprolol succinate (TOPROL-XL)  50 MG 24 hr tablet Take 100 mg by mouth daily.   . Multiple Vitamin (MULTIVITAMIN WITH MINERALS) TABS tablet Take 1 tablet by mouth daily.  . ondansetron (ZOFRAN) 8 MG tablet Take 8 mg by mouth every 8 (eight) hours as needed for nausea or vomiting.  . pantoprazole (PROTONIX) 40 MG tablet Take 40 mg by mouth every evening.   . polyethylene glycol (MIRALAX / GLYCOLAX) packet Take 17 g by mouth daily as needed for mild constipation.  . pseudoephedrine-guaifenesin (MUCINEX D) 60-600 MG 12 hr tablet Take 1 tablet by mouth every 12 (twelve) hours.  . tamoxifen (NOLVADEX) 20 MG tablet Take 1 tablet (20 mg total) by mouth daily.  . [DISCONTINUED] predniSONE (DELTASONE) 20 MG tablet Take 2 tablets (40 mg total) by mouth daily with breakfast.   Facility-Administered Encounter Medications as of 12/26/2018  Medication  . heparin lock flush 100 unit/mL  . sodium chloride flush (NS) 0.9 % injection 10 mL    ALLERGIES:  Allergies  Allergen Reactions  . Cefepime     Suspected severe thrombocytopenia is a result of cefepime induced antigen platelet destruction     PHYSICAL EXAM:  ECOG Performance status: 1  Vitals:   12/26/18 0918  BP: 127/68  Pulse: 92  Resp: 18  Temp: 98.9 F (37.2 C)  SpO2: 98%   Filed Weights   12/26/18 0918  Weight: 202 lb 8 oz (91.9 kg)    Physical Exam Constitutional:      Appearance: Normal appearance. He is normal weight.  Cardiovascular:     Rate and Rhythm: Normal rate and regular rhythm.     Heart sounds: Normal heart sounds.  Pulmonary:     Effort: Pulmonary effort is normal.     Breath sounds: Normal breath sounds.  Musculoskeletal: Normal range of motion.  Skin:    General: Skin is warm and dry.  Neurological:     Mental Status: He is alert and oriented to person, place, and time. Mental status is at baseline.  Psychiatric:        Mood and Affect: Mood normal.        Behavior: Behavior normal.        Thought Content: Thought content normal.          Judgment: Judgment normal.      LABORATORY DATA:  I have reviewed the labs as listed.  CBC    Component Value Date/Time   WBC 5.3 12/26/2018 0852   RBC 2.70 (L) 12/26/2018 0852   HGB 8.8 (L) 12/26/2018 0852   HCT 27.7 (L) 12/26/2018 0852   PLT 131 (L) 12/26/2018 0852   MCV 102.6 (H) 12/26/2018 0852   MCH 32.6 12/26/2018 0852   MCHC 31.8 12/26/2018 0852   RDW 17.7 (H) 12/26/2018 0852   LYMPHSABS 0.6 (L) 12/26/2018 0852   MONOABS 0.5 12/26/2018 0852   EOSABS 0.4 12/26/2018 0852   BASOSABS 0.0 12/26/2018 0852   CMP Latest Ref Rng & Units 12/26/2018 12/09/2018 12/05/2018  Glucose 70 - 99 mg/dL 115(H) 113(H) 131(H)  BUN 8 - 23 mg/dL 22 29(H) 35(H)  Creatinine 0.61 - 1.24 mg/dL 2.03(H) 1.68(H) 1.97(H)  Sodium 135 - 145 mmol/L 137 134(L) 137  Potassium 3.5 - 5.1 mmol/L 4.0 4.3 3.8  Chloride 98 - 111 mmol/L 104 110 115(H)  CO2 22 - 32 mmol/L 26 19(L) 18(L)  Calcium 8.9 - 10.3 mg/dL 9.9 7.9(L) 6.9(L)  Total Protein 6.5 - 8.1 g/dL 6.6 6.4(L) -  Total Bilirubin 0.3 - 1.2 mg/dL 0.5 0.8 -  Alkaline Phos 38 - 126 U/L 28(L) 24(L) -  AST 15 - 41 U/L 24 64(H) -  ALT 0 - 44 U/L 24 80(H) -       DIAGNOSTIC IMAGING:  I have independently reviewed the scans and discussed with the patient.   I have reviewed Francene Finders, NP's note and agree with the documentation.  I personally performed a face-to-face visit, made revisions and my assessment and plan is as follows.    ASSESSMENT & PLAN:   Multiple myeloma not having achieved remission ([name] 1.  Stage II IgG kappa multiple myeloma (Dx February 2018): -RVD from 03/01/2017 through 06/29/2017 -Chemotherapy changed to 3 cycles of CCyD from 07/12/2017-04/2017 to obtain deeper response - Status post auto stem cell transplant on 10/08/2017 -Stable M spike after transplant, started on CCyD, cycle 1 on 02/14/2018, cycle 2 on 03/14/2018 -His M spike after 2 cycles was undetectable, when measured at [facility] last week.  He was recommended to  have maintenance with carfilzomib 70 mg/m square on days 1 and 15 every 28 days by Dr. Norma Fredrickson. -He received his first carfilzomib 25m/m2 on 04/20/2018, developed fever with chills 3 hours later with weakness in the extremities.  He was admitted to the hospital, received 1 unit of blood transfusion.  Blood cultures turned out to be negative.  He was febrile after hospitalization and was sent home. - Subsequently he tolerated carfilzomib and smaller doses very well.  We have increased his doses to 60 mg/m on 09/01/2018 and he tolerated 4 doses very well with the addition of dexamethasone. - We have increased his carfilzomib to 70 mg/m on 10/27/2018.  He tolerated it well for first 2 treatments. - Last treatment of high-dose carfilzomib was on 11/28/2018. -He was hospitalized on 11/29/2018 through 12/05/2018 with bronchitis, positive for parainfluenza, acute renal failure and severe thrombocytopenia.  His platelet count went down to 9.  We have given IVIG 1 g/kg x 2 doses.  Platelet count has slowly improved. -  I have reviewed literature.  Carfilzomib can cause dose-related thrombotic microangiopathy.  Peripheral blood smear when he was hospitalized did not show significant schistocytes. -I had a discussion with Dr. Norma Fredrickson about these findings.  We have agreed to start him on lower dose of carfilzomib.  He has tolerated lower doses in the past very well. - His cough is completely gone.  I plan to start him back on carfilzomib next week.  2.  Metastatic left breast cancer to the bones (Dx 2011): -he is status post lumpectomy and was started on tamoxifen. -he will continue tamoxifen.  3.  Peripheral neuropathy: His neuropathy has been stable.  He is continuing gabapentin 300 mg twice daily.  4.  Infection prophylaxis: He will continue acyclovir 400 mg twice daily.  5.  Bone strengthening: -He is receiving denosumab.  Last dose was on 12/09/2018.      Orders placed this encounter:  Orders  Placed This Encounter  Procedures  . Lactate dehydrogenase  . Protein electrophoresis, serum  . Kappa/lambda light chains  . CBC with Differential/Platelet  . Comprehensive metabolic panel  . Ferritin  . Iron and TIBC  . Vitamin B12  . Folate  . CBC with Differential/Platelet  . Comprehensive metabolic panel      Derek Jack, MD [place] [phone]

## 2018-12-26 NOTE — Assessment & Plan Note (Addendum)
1.  Stage II IgG kappa multiple myeloma (Dx February 2018): -RVD from 03/01/2017 through 06/29/2017 -Chemotherapy changed to 3 cycles of CCyD from 07/12/2017-04/2017 to obtain deeper response - Status post auto stem cell transplant on 10/08/2017 -Stable M spike after transplant, started on CCyD, cycle 1 on 02/14/2018, cycle 2 on 03/14/2018 -His M spike after 2 cycles was undetectable, when measured at [facility] last week.  He was recommended to have maintenance with carfilzomib 70 mg/m square on days 1 and 15 every 28 days by Dr. Rodriguez. -He received his first carfilzomib 70mg/m2 on 04/20/2018, developed fever with chills 3 hours later with weakness in the extremities.  He was admitted to the hospital, received 1 unit of blood transfusion.  Blood cultures turned out to be negative.  He was febrile after hospitalization and was sent home. - Subsequently he tolerated carfilzomib and smaller doses very well.  We have increased his doses to 60 mg/m on 09/01/2018 and he tolerated 4 doses very well with the addition of dexamethasone. - We have increased his carfilzomib to 70 mg/m on 10/27/2018.  He tolerated it well for first 2 treatments. - Last treatment of high-dose carfilzomib was on 11/28/2018. -He was hospitalized on 11/29/2018 through 12/05/2018 with bronchitis, positive for parainfluenza, acute renal failure and severe thrombocytopenia.  His platelet count went down to 9.  We have given IVIG 1 g/kg x 2 doses.  Platelet count has slowly improved. - I have reviewed literature.  Carfilzomib can cause dose-related thrombotic microangiopathy.  Peripheral blood smear when he was hospitalized did not show significant schistocytes. -I had a discussion with Dr. Rodriguez about these findings.  We have agreed to start him on lower dose of carfilzomib.  He has tolerated lower doses in the past very well. - His cough is completely gone.  I plan to start him back on carfilzomib next week.  2.  Metastatic left breast  cancer to the bones (Dx 2011): -he is status post lumpectomy and was started on tamoxifen. -he will continue tamoxifen.  3.  Peripheral neuropathy: His neuropathy has been stable.  He is continuing gabapentin 300 mg twice daily.  4.  Infection prophylaxis: He will continue acyclovir 400 mg twice daily.  5.  Bone strengthening: -He is receiving denosumab.  Last dose was on 12/09/2018. 

## 2018-12-26 NOTE — Progress Notes (Signed)
Chemo tx held today after MD appt per Dr. Delton Coombes.

## 2018-12-27 LAB — PROTEIN ELECTROPHORESIS, SERUM
A/G Ratio: 1.3 (ref 0.7–1.7)
ALBUMIN ELP: 3.6 g/dL (ref 2.9–4.4)
Alpha-1-Globulin: 0.3 g/dL (ref 0.0–0.4)
Alpha-2-Globulin: 0.6 g/dL (ref 0.4–1.0)
Beta Globulin: 0.8 g/dL (ref 0.7–1.3)
Gamma Globulin: 1 g/dL (ref 0.4–1.8)
Globulin, Total: 2.7 g/dL (ref 2.2–3.9)
Total Protein ELP: 6.3 g/dL (ref 6.0–8.5)

## 2018-12-27 LAB — IGG, IGA, IGM
IgA: 16 mg/dL — ABNORMAL LOW (ref 61–437)
IgG (Immunoglobin G), Serum: 1241 mg/dL (ref 700–1600)
IgM (Immunoglobulin M), Srm: 30 mg/dL (ref 20–172)

## 2018-12-27 LAB — KAPPA/LAMBDA LIGHT CHAINS
Kappa free light chain: 14 mg/L (ref 3.3–19.4)
Kappa, lambda light chain ratio: 2.26 — ABNORMAL HIGH (ref 0.26–1.65)
Lambda free light chains: 6.2 mg/L (ref 5.7–26.3)

## 2019-01-03 ENCOUNTER — Other Ambulatory Visit (HOSPITAL_COMMUNITY): Payer: Self-pay | Admitting: Hematology

## 2019-01-03 NOTE — Progress Notes (Signed)
Carfilzomib changed to 36 mg/m2 for now.  Dr Delton Coombes will escalate to a maximum of 56mg /m2 as patient tolerates. Patient has demonstrated repeated intolerance to higher dosing of Carfilzimib along with comorbid factors presenting as symptoms of hypotension and difficulty breathing severe enough for hospital admissions.

## 2019-01-05 ENCOUNTER — Encounter (HOSPITAL_COMMUNITY): Payer: Self-pay

## 2019-01-05 ENCOUNTER — Inpatient Hospital Stay (HOSPITAL_COMMUNITY): Payer: Medicare Other | Attending: Hematology

## 2019-01-05 ENCOUNTER — Other Ambulatory Visit: Payer: Self-pay

## 2019-01-05 ENCOUNTER — Inpatient Hospital Stay (HOSPITAL_COMMUNITY): Payer: Medicare Other

## 2019-01-05 VITALS — BP 118/69 | HR 70 | Temp 97.9°F | Resp 18 | Wt 202.6 lb

## 2019-01-05 DIAGNOSIS — N179 Acute kidney failure, unspecified: Secondary | ICD-10-CM | POA: Insufficient documentation

## 2019-01-05 DIAGNOSIS — G629 Polyneuropathy, unspecified: Secondary | ICD-10-CM | POA: Diagnosis not present

## 2019-01-05 DIAGNOSIS — C9000 Multiple myeloma not having achieved remission: Secondary | ICD-10-CM | POA: Insufficient documentation

## 2019-01-05 DIAGNOSIS — Z79899 Other long term (current) drug therapy: Secondary | ICD-10-CM | POA: Diagnosis not present

## 2019-01-05 DIAGNOSIS — Z9221 Personal history of antineoplastic chemotherapy: Secondary | ICD-10-CM | POA: Insufficient documentation

## 2019-01-05 DIAGNOSIS — Z7982 Long term (current) use of aspirin: Secondary | ICD-10-CM | POA: Diagnosis not present

## 2019-01-05 DIAGNOSIS — Z9484 Stem cells transplant status: Secondary | ICD-10-CM | POA: Diagnosis not present

## 2019-01-05 DIAGNOSIS — C50922 Malignant neoplasm of unspecified site of left male breast: Secondary | ICD-10-CM | POA: Insufficient documentation

## 2019-01-05 DIAGNOSIS — H35713 Central serous chorioretinopathy, bilateral: Secondary | ICD-10-CM | POA: Diagnosis not present

## 2019-01-05 DIAGNOSIS — Z5111 Encounter for antineoplastic chemotherapy: Secondary | ICD-10-CM | POA: Insufficient documentation

## 2019-01-05 DIAGNOSIS — I10 Essential (primary) hypertension: Secondary | ICD-10-CM | POA: Diagnosis not present

## 2019-01-05 DIAGNOSIS — Z79810 Long term (current) use of selective estrogen receptor modulators (SERMs): Secondary | ICD-10-CM | POA: Insufficient documentation

## 2019-01-05 DIAGNOSIS — Z923 Personal history of irradiation: Secondary | ICD-10-CM | POA: Insufficient documentation

## 2019-01-05 DIAGNOSIS — Z170 Estrogen receptor positive status [ER+]: Secondary | ICD-10-CM | POA: Insufficient documentation

## 2019-01-05 DIAGNOSIS — R531 Weakness: Secondary | ICD-10-CM | POA: Diagnosis not present

## 2019-01-05 DIAGNOSIS — D696 Thrombocytopenia, unspecified: Secondary | ICD-10-CM | POA: Insufficient documentation

## 2019-01-05 DIAGNOSIS — F419 Anxiety disorder, unspecified: Secondary | ICD-10-CM | POA: Insufficient documentation

## 2019-01-05 DIAGNOSIS — D539 Nutritional anemia, unspecified: Secondary | ICD-10-CM | POA: Diagnosis not present

## 2019-01-05 DIAGNOSIS — J40 Bronchitis, not specified as acute or chronic: Secondary | ICD-10-CM | POA: Diagnosis not present

## 2019-01-05 DIAGNOSIS — K219 Gastro-esophageal reflux disease without esophagitis: Secondary | ICD-10-CM | POA: Insufficient documentation

## 2019-01-05 DIAGNOSIS — H25813 Combined forms of age-related cataract, bilateral: Secondary | ICD-10-CM | POA: Diagnosis not present

## 2019-01-05 LAB — COMPREHENSIVE METABOLIC PANEL
ALT: 35 U/L (ref 0–44)
AST: 28 U/L (ref 15–41)
Albumin: 3.8 g/dL (ref 3.5–5.0)
Alkaline Phosphatase: 54 U/L (ref 38–126)
Anion gap: 7 (ref 5–15)
BUN: 21 mg/dL (ref 8–23)
CO2: 26 mmol/L (ref 22–32)
Calcium: 9.1 mg/dL (ref 8.9–10.3)
Chloride: 107 mmol/L (ref 98–111)
Creatinine, Ser: 1.64 mg/dL — ABNORMAL HIGH (ref 0.61–1.24)
GFR calc Af Amer: 48 mL/min — ABNORMAL LOW (ref >60)
GFR, EST NON AFRICAN AMERICAN: 42 mL/min — AB (ref >60)
Glucose, Bld: 89 mg/dL (ref 70–99)
Potassium: 4.5 mmol/L (ref 3.5–5.1)
Sodium: 140 mmol/L (ref 135–145)
Total Bilirubin: 0.4 mg/dL (ref 0.3–1.2)
Total Protein: 7.2 g/dL (ref 6.5–8.1)

## 2019-01-05 LAB — CBC WITH DIFFERENTIAL/PLATELET
Abs Immature Granulocytes: 0.02 10*3/uL (ref 0.00–0.07)
Basophils Absolute: 0 10*3/uL (ref 0.0–0.1)
Basophils Relative: 1 %
EOS ABS: 0.4 10*3/uL (ref 0.0–0.5)
Eosinophils Relative: 6 %
HCT: 28.1 % — ABNORMAL LOW (ref 39.0–52.0)
Hemoglobin: 8.7 g/dL — ABNORMAL LOW (ref 13.0–17.0)
Immature Granulocytes: 0 %
LYMPHS ABS: 1 10*3/uL (ref 0.7–4.0)
Lymphocytes Relative: 16 %
MCH: 31.9 pg (ref 26.0–34.0)
MCHC: 31 g/dL (ref 30.0–36.0)
MCV: 102.9 fL — ABNORMAL HIGH (ref 80.0–100.0)
Monocytes Absolute: 0.7 10*3/uL (ref 0.1–1.0)
Monocytes Relative: 11 %
Neutro Abs: 4.2 10*3/uL (ref 1.7–7.7)
Neutrophils Relative %: 66 %
Platelets: 264 10*3/uL (ref 150–400)
RBC: 2.73 MIL/uL — ABNORMAL LOW (ref 4.22–5.81)
RDW: 17.8 % — AB (ref 11.5–15.5)
WBC: 6.4 10*3/uL (ref 4.0–10.5)
nRBC: 0 % (ref 0.0–0.2)

## 2019-01-05 LAB — LACTATE DEHYDROGENASE: LDH: 131 U/L (ref 98–192)

## 2019-01-05 MED ORDER — SODIUM CHLORIDE 0.9 % IV SOLN
Freq: Once | INTRAVENOUS | Status: AC
  Administered 2019-01-05: 14:00:00 via INTRAVENOUS

## 2019-01-05 MED ORDER — DEXTROSE 5 % IV SOLN
34.0000 mg/m2 | Freq: Once | INTRAVENOUS | Status: AC
  Administered 2019-01-05: 70 mg via INTRAVENOUS
  Filled 2019-01-05: qty 5

## 2019-01-05 MED ORDER — ACETAMINOPHEN 325 MG PO TABS
650.0000 mg | ORAL_TABLET | Freq: Once | ORAL | Status: AC
  Administered 2019-01-05: 650 mg via ORAL
  Filled 2019-01-05: qty 2

## 2019-01-05 MED ORDER — SODIUM CHLORIDE 0.9% FLUSH
10.0000 mL | INTRAVENOUS | Status: DC | PRN
  Administered 2019-02-03 (×2): 3 mL
  Filled 2019-01-05 (×2): qty 10

## 2019-01-05 MED ORDER — SODIUM CHLORIDE 0.9 % IV SOLN
Freq: Once | INTRAVENOUS | Status: AC
  Administered 2019-01-05: 15:00:00 via INTRAVENOUS

## 2019-01-05 MED ORDER — SODIUM CHLORIDE 0.9 % IV SOLN
20.0000 mg | Freq: Once | INTRAVENOUS | Status: AC
  Administered 2019-01-05: 20 mg via INTRAVENOUS
  Filled 2019-01-05: qty 2

## 2019-01-05 MED ORDER — HEPARIN SOD (PORK) LOCK FLUSH 100 UNIT/ML IV SOLN
500.0000 [IU] | Freq: Once | INTRAVENOUS | Status: AC | PRN
  Administered 2019-01-05: 500 [IU]

## 2019-01-05 NOTE — Progress Notes (Unsigned)
Labs within parameters for treatment today. Francene Finders NP made aware of creatinine 1.64. This is better than previous results. No new issues reported by patient today.  Proceed with treatment.  Treatment given per orders. Patient tolerated it well without problems. Vitals stable and discharged home from clinic ambulatory. Follow up as scheduled.

## 2019-01-05 NOTE — Patient Instructions (Signed)
[facility] Discharge Instructions for Patients Receiving Chemotherapy  Today you received the following chemotherapy agents   To help prevent nausea and vomiting after your treatment, we encourage you to take your nausea medication   If you develop nausea and vomiting that is not controlled by your nausea medication, call the clinic.   BELOW ARE SYMPTOMS THAT SHOULD BE REPORTED IMMEDIATELY:  *FEVER GREATER THAN 100.5 F  *CHILLS WITH OR WITHOUT FEVER  NAUSEA AND VOMITING THAT IS NOT CONTROLLED WITH YOUR NAUSEA MEDICATION  *UNUSUAL SHORTNESS OF BREATH  *UNUSUAL BRUISING OR BLEEDING  TENDERNESS IN MOUTH AND THROAT WITH OR WITHOUT PRESENCE OF ULCERS  *URINARY PROBLEMS  *BOWEL PROBLEMS  UNUSUAL RASH Items with * indicate a potential emergency and should be followed up as soon as possible.  Feel free to call the clinic should you have any questions or concerns. The clinic phone number is [phone].  Please show the CHEMO ALERT CARD at check-in to the Emergency Department and triage nurse.   

## 2019-01-06 ENCOUNTER — Inpatient Hospital Stay (HOSPITAL_COMMUNITY): Payer: Medicare Other | Attending: Hematology

## 2019-01-06 VITALS — BP 132/71 | HR 86 | Temp 98.6°F | Resp 16

## 2019-01-06 DIAGNOSIS — C9000 Multiple myeloma not having achieved remission: Secondary | ICD-10-CM | POA: Diagnosis not present

## 2019-01-06 DIAGNOSIS — C7951 Secondary malignant neoplasm of bone: Secondary | ICD-10-CM

## 2019-01-06 DIAGNOSIS — Z79899 Other long term (current) drug therapy: Secondary | ICD-10-CM | POA: Diagnosis not present

## 2019-01-06 LAB — KAPPA/LAMBDA LIGHT CHAINS
KAPPA, LAMDA LIGHT CHAIN RATIO: 1.79 — AB (ref 0.26–1.65)
Kappa free light chain: 14.7 mg/L (ref 3.3–19.4)
Lambda free light chains: 8.2 mg/L (ref 5.7–26.3)

## 2019-01-06 MED ORDER — DENOSUMAB 120 MG/1.7ML ~~LOC~~ SOLN
120.0000 mg | Freq: Once | SUBCUTANEOUS | Status: AC
  Administered 2019-01-06: 120 mg via SUBCUTANEOUS
  Filled 2019-01-06: qty 1.7

## 2019-01-06 NOTE — Progress Notes (Signed)
Johnny Lucas presents today for injection per MD orders. xgeva 120 mg administered SQ in  Upper Arm. Administration without incident. Patient tolerated well.   Vitals stable and discharged home from clinic ambulatory. Follow up as scheduled.

## 2019-01-09 LAB — PROTEIN ELECTROPHORESIS, SERUM
A/G Ratio: 1.1 (ref 0.7–1.7)
Albumin ELP: 3.5 g/dL (ref 2.9–4.4)
Alpha-1-Globulin: 0.4 g/dL (ref 0.0–0.4)
Alpha-2-Globulin: 0.8 g/dL (ref 0.4–1.0)
Beta Globulin: 1 g/dL (ref 0.7–1.3)
Gamma Globulin: 0.9 g/dL (ref 0.4–1.8)
Globulin, Total: 3.1 g/dL (ref 2.2–3.9)
M-Spike, %: 0.1 g/dL — ABNORMAL HIGH
Total Protein ELP: 6.6 g/dL (ref 6.0–8.5)

## 2019-01-18 ENCOUNTER — Telehealth (HOSPITAL_COMMUNITY): Payer: Self-pay | Admitting: *Deleted

## 2019-01-18 NOTE — Telephone Encounter (Signed)
Pt called clinic reporting generalized weakness and fatigue x 3. Denies fever, chills, n/v/d.  States he is able to get up and walk and does not report any falls.  States, "I just have to push myself to do anything.". Pt has blood pressure monitor at home and states his BP has been running 026C systolic. State he has changed the way he takes his hydralazine - he is only taking one pill twice a day versus one pill three times a day. Pt has appt to see Dr. Delton Coombes tomorrow at 1045. Instructed pt to keep his appt with MD and to report to the [facility] ED should his symptoms worsen, or if he develops any new symptoms.  Pt verbalizes understanding and is agreeable to this plan.

## 2019-01-19 ENCOUNTER — Encounter (HOSPITAL_COMMUNITY): Payer: Self-pay | Admitting: Hematology

## 2019-01-19 ENCOUNTER — Inpatient Hospital Stay (HOSPITAL_COMMUNITY): Payer: Medicare Other

## 2019-01-19 ENCOUNTER — Other Ambulatory Visit: Payer: Self-pay

## 2019-01-19 ENCOUNTER — Inpatient Hospital Stay (HOSPITAL_BASED_OUTPATIENT_CLINIC_OR_DEPARTMENT_OTHER): Payer: Medicare Other | Admitting: Hematology

## 2019-01-19 VITALS — BP 144/61 | HR 86 | Temp 98.1°F | Resp 18

## 2019-01-19 VITALS — BP 118/58 | HR 71 | Temp 98.8°F | Resp 16 | Wt 202.0 lb

## 2019-01-19 DIAGNOSIS — Z79810 Long term (current) use of selective estrogen receptor modulators (SERMs): Secondary | ICD-10-CM

## 2019-01-19 DIAGNOSIS — I10 Essential (primary) hypertension: Secondary | ICD-10-CM

## 2019-01-19 DIAGNOSIS — N179 Acute kidney failure, unspecified: Secondary | ICD-10-CM

## 2019-01-19 DIAGNOSIS — Z9221 Personal history of antineoplastic chemotherapy: Secondary | ICD-10-CM

## 2019-01-19 DIAGNOSIS — Z7982 Long term (current) use of aspirin: Secondary | ICD-10-CM

## 2019-01-19 DIAGNOSIS — D696 Thrombocytopenia, unspecified: Secondary | ICD-10-CM | POA: Diagnosis not present

## 2019-01-19 DIAGNOSIS — Z170 Estrogen receptor positive status [ER+]: Secondary | ICD-10-CM | POA: Diagnosis not present

## 2019-01-19 DIAGNOSIS — R531 Weakness: Secondary | ICD-10-CM

## 2019-01-19 DIAGNOSIS — C9000 Multiple myeloma not having achieved remission: Secondary | ICD-10-CM

## 2019-01-19 DIAGNOSIS — J90 Pleural effusion, not elsewhere classified: Secondary | ICD-10-CM

## 2019-01-19 DIAGNOSIS — D539 Nutritional anemia, unspecified: Secondary | ICD-10-CM | POA: Diagnosis not present

## 2019-01-19 DIAGNOSIS — Z9484 Stem cells transplant status: Secondary | ICD-10-CM

## 2019-01-19 DIAGNOSIS — Z923 Personal history of irradiation: Secondary | ICD-10-CM

## 2019-01-19 DIAGNOSIS — C50922 Malignant neoplasm of unspecified site of left male breast: Secondary | ICD-10-CM | POA: Diagnosis not present

## 2019-01-19 DIAGNOSIS — G629 Polyneuropathy, unspecified: Secondary | ICD-10-CM

## 2019-01-19 DIAGNOSIS — K219 Gastro-esophageal reflux disease without esophagitis: Secondary | ICD-10-CM

## 2019-01-19 DIAGNOSIS — Z5111 Encounter for antineoplastic chemotherapy: Secondary | ICD-10-CM | POA: Diagnosis not present

## 2019-01-19 DIAGNOSIS — F419 Anxiety disorder, unspecified: Secondary | ICD-10-CM | POA: Diagnosis not present

## 2019-01-19 DIAGNOSIS — Z79899 Other long term (current) drug therapy: Secondary | ICD-10-CM

## 2019-01-19 LAB — FERRITIN: FERRITIN: 533 ng/mL — AB (ref 24–336)

## 2019-01-19 LAB — IRON AND TIBC
Iron: 78 ug/dL (ref 45–182)
Saturation Ratios: 26 % (ref 17.9–39.5)
TIBC: 303 ug/dL (ref 250–450)
UIBC: 225 ug/dL

## 2019-01-19 LAB — LACTATE DEHYDROGENASE: LDH: 106 U/L (ref 98–192)

## 2019-01-19 LAB — COMPREHENSIVE METABOLIC PANEL
ALT: 13 U/L (ref 0–44)
AST: 14 U/L — ABNORMAL LOW (ref 15–41)
Albumin: 3.7 g/dL (ref 3.5–5.0)
Alkaline Phosphatase: 25 U/L — ABNORMAL LOW (ref 38–126)
Anion gap: 7 (ref 5–15)
BUN: 19 mg/dL (ref 8–23)
CALCIUM: 8.6 mg/dL — AB (ref 8.9–10.3)
CO2: 23 mmol/L (ref 22–32)
Chloride: 108 mmol/L (ref 98–111)
Creatinine, Ser: 1.82 mg/dL — ABNORMAL HIGH (ref 0.61–1.24)
GFR calc Af Amer: 43 mL/min — ABNORMAL LOW (ref >60)
GFR calc non Af Amer: 37 mL/min — ABNORMAL LOW (ref >60)
Glucose, Bld: 111 mg/dL — ABNORMAL HIGH (ref 70–99)
Potassium: 4 mmol/L (ref 3.5–5.1)
SODIUM: 138 mmol/L (ref 135–145)
Total Bilirubin: 0.6 mg/dL (ref 0.3–1.2)
Total Protein: 6.2 g/dL — ABNORMAL LOW (ref 6.5–8.1)

## 2019-01-19 LAB — CBC WITH DIFFERENTIAL/PLATELET
Abs Immature Granulocytes: 0.02 10*3/uL (ref 0.00–0.07)
Basophils Absolute: 0 10*3/uL (ref 0.0–0.1)
Basophils Relative: 0 %
Eosinophils Absolute: 0.4 10*3/uL (ref 0.0–0.5)
Eosinophils Relative: 7 %
HCT: 27.6 % — ABNORMAL LOW (ref 39.0–52.0)
Hemoglobin: 8.6 g/dL — ABNORMAL LOW (ref 13.0–17.0)
Immature Granulocytes: 0 %
Lymphocytes Relative: 14 %
Lymphs Abs: 0.9 10*3/uL (ref 0.7–4.0)
MCH: 32.7 pg (ref 26.0–34.0)
MCHC: 31.2 g/dL (ref 30.0–36.0)
MCV: 104.9 fL — ABNORMAL HIGH (ref 80.0–100.0)
Monocytes Absolute: 0.4 10*3/uL (ref 0.1–1.0)
Monocytes Relative: 7 %
Neutro Abs: 4.5 10*3/uL (ref 1.7–7.7)
Neutrophils Relative %: 72 %
Platelets: 203 10*3/uL (ref 150–400)
RBC: 2.63 MIL/uL — AB (ref 4.22–5.81)
RDW: 18.6 % — ABNORMAL HIGH (ref 11.5–15.5)
WBC: 6.3 10*3/uL (ref 4.0–10.5)
nRBC: 0 % (ref 0.0–0.2)

## 2019-01-19 LAB — VITAMIN B12: Vitamin B-12: 1380 pg/mL — ABNORMAL HIGH (ref 180–914)

## 2019-01-19 LAB — FOLATE: Folate: >100 ng/mL (ref >5.9)

## 2019-01-19 MED ORDER — HEPARIN SOD (PORK) LOCK FLUSH 100 UNIT/ML IV SOLN
500.0000 [IU] | Freq: Once | INTRAVENOUS | Status: AC | PRN
  Administered 2019-01-19: 500 [IU]
  Filled 2019-01-19: qty 5

## 2019-01-19 MED ORDER — SODIUM CHLORIDE 0.9 % IV SOLN
Freq: Once | INTRAVENOUS | Status: AC
  Administered 2019-01-19: 12:00:00 via INTRAVENOUS

## 2019-01-19 MED ORDER — ACETAMINOPHEN 325 MG PO TABS
650.0000 mg | ORAL_TABLET | Freq: Once | ORAL | Status: AC
  Administered 2019-01-19: 650 mg via ORAL
  Filled 2019-01-19: qty 2

## 2019-01-19 MED ORDER — SODIUM CHLORIDE 0.9 % IV SOLN
Freq: Once | INTRAVENOUS | Status: AC
  Administered 2019-01-19: 14:00:00 via INTRAVENOUS

## 2019-01-19 MED ORDER — SODIUM CHLORIDE 0.9 % IV SOLN
INTRAVENOUS | Status: DC
  Administered 2019-01-19: 12:00:00 via INTRAVENOUS

## 2019-01-19 MED ORDER — DEXTROSE 5 % IV SOLN
34.0000 mg/m2 | Freq: Once | INTRAVENOUS | Status: AC
  Administered 2019-01-19: 70 mg via INTRAVENOUS
  Filled 2019-01-19: qty 5

## 2019-01-19 MED ORDER — SODIUM CHLORIDE 0.9% FLUSH
10.0000 mL | INTRAVENOUS | Status: DC | PRN
  Administered 2019-01-19: 10 mL
  Filled 2019-01-19: qty 10

## 2019-01-19 MED ORDER — SODIUM CHLORIDE 0.9 % IV SOLN
20.0000 mg | Freq: Once | INTRAVENOUS | Status: AC
  Administered 2019-01-19: 20 mg via INTRAVENOUS
  Filled 2019-01-19: qty 2

## 2019-01-19 NOTE — Progress Notes (Signed)
[place], [place] [address]   CLINIC:  Medical Oncology/Hematology  PCP:  [name], MD [place] [address] [phone]   REASON FOR VISIT: Follow-up for multiple myeloma S/P stem cell transplant  CURRENT THERAPY:Carfilzomib every 2 weeks  BRIEF ONCOLOGIC HISTORY:    Breast cancer, male ([place])   12/31/2009 Initial Biopsy    Biopsy of L breast     12/31/2009 Pathology Results    Invasive ductal carcinoma, ER/PR+, HER 2 negative    12/31/2009 Imaging    Ultrasound showing a 2.43 x 1.85 x 3 cm hypoechoic spiculated mass in the 12 o clock L breast retroareolar region    01/01/2010 -  Anti-estrogen oral therapy    Tamoxifen 20 mg daily    01/06/2010 Imaging    Bone scan abnormal uptake in the diaphysis of the R humerus, abnormal in the R third, fifth and sixth ribs, lesion also noted in the sternum.    02/03/2010 Surgery    Rod placement and fixation of R humerus by Dr. [name]    02/05/2010 - 02/18/2010 Radiation Therapy    30Gy in 10 fractions of 3 Gy per fraction to R pathologic fracture    03/11/2010 -  Chemotherapy    Denosumab monthly, now every 3 months. Started at [facility]     06/09/2016 Imaging    Three hypermetabolic osseous lesions in the sternum, left ilium and right ilium, as discussed above, likely represent osseous metastases. At this time, these are not recognizable on the CT images. 2. No extra skeletal metastatic disease identified in the neck, chest, abdomen or pelvis.    10/13/2016 Progression    PET shows various new and enlarging osseous metastatic lesions with no definite extra osseous metastatic disease currently identified.     12/31/2016 Progression    1. Multifocal hypermetabolic osseous metastases throughout the axial and proximal appendicular skeleton, which are increased in size, number and metabolism since 10/13/2016 PET-CT. 2. 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 3. No additional sites of hypermetabolic metastatic disease. 4. Chronic right mastoid sinusitis. 5. Aortic atherosclerosis.  One vessel coronary atherosclerosis.     Multiple myeloma ([place])   02/12/2017 Bone Marrow Biopsy    The marrow was variably cellular with large peritrabecular aggregates of kappa restricted plasma cells (66% by aspirate, 30% by Cd138). Cytogenetics +11.     03/01/2017 - 06/29/2017 Chemotherapy    RVD     05/26/2017 Bone Marrow Biopsy    Performed at [facility]:  Plasma cell myeloma in a 30% cellularmarrow with decreased trilineage hematopoiesis and 42% kappalight chain restricted plasma cells on the aspirate smears andlarge aggregates on the core biopsy.     07/12/2017 - 09/01/2017 Chemotherapy    3 cycles of carfizolmib/cyclophosphamide/dexamethasone      10/08/2017 Bone Marrow Transplant    Autotransplant at [facility]      CANCER STAGING: Cancer Staging Multiple myeloma [facility]) Staging form: Plasma Cell Myeloma and Plasma Cell Disorders, AJCC 8th Edition - Clinical stage from 05/03/2017: Beta-2-microglobulin (mg/L): 3.5, Albumin (g/dL): 3.4, ISS: Stage II, High-risk cytogenetics: Absent, LDH: Not assessed - Signed by Baird [facility], PA-C on 05/03/2017    INTERVAL HISTORY:  Johnny Lucas 73 y.o. male returns for routine follow-up for multiple myeloma S/P stem cell transplant. He is here today with his wife. He is  tolerating treatment well. He did experience weakness in his legs for a week after treatment. It has improved at this time. Denies any nausea, vomiting, or diarrhea. Denies any new pains. Had not noticed any recent bleeding such as epistaxis, hematuria or hematochezia. Denies recent chest pain on exertion, shortness of breath on minimal exertion, pre-syncopal episodes, or palpitations. Denies any numbness or tingling in hands or feet.  Denies any recent fevers, infections, or recent hospitalizations. Patient reports appetite at 75% and energy level at 50%.  REVIEW OF SYSTEMS:  Review of Systems  All other systems reviewed and are negative.    PAST MEDICAL/SURGICAL HISTORY:  Past Medical History:  Diagnosis Date  . Anxiety   . Bone metastases ([place]) 09/10/2016  . Breast cancer ([place]) 2011   Stave IV breast cancer; radiation and tamoxifen  . Breast cancer, male ([place])    Stave IV breast cancer; radiation and tamoxifen  Overview:  Left breast ca with mets bone  Overview:  METS TO BONE  . GERD (gastroesophageal reflux disease)   . Hypertension   . Macular degeneration   . Multiple myeloma ([place]) 02/18/2017  . Peripheral neuropathy    Past Surgical History:  Procedure Laterality Date  . BACK SURGERY    . HERNIA REPAIR    . PORTACATH PLACEMENT Left 06/10/2018   Procedure: INSERTION PORT-A-CATH;  Surgeon: Virl Cagey, MD;  Location: [facility];  Service: General;  Laterality: Left;  . right arm surgery       SOCIAL HISTORY:  Social History   Socioeconomic History  . Marital status: Married    Spouse name: Not on file  . Number of children: 3  . Years of education: Not on file  . Highest education level: Not on file  Occupational History  . Not on file  Social Needs  . Financial resource strain: Not on file  . Food insecurity:    Worry: Not on file    Inability: Not on file  . Transportation needs:    Medical: Not on file    Non-medical: Not on file  Tobacco Use  . Smoking status: Never Smoker  . Smokeless tobacco: Former Network engineer and Sexual Activity  . Alcohol use: Yes    Alcohol/week: 24.0 standard drinks    Types: 24 Cans of beer per week  . Drug use: No  . Sexual activity: Not on file  Lifestyle  . Physical activity:    Days per week: Not on file    Minutes per session: Not on file  . Stress: Not on file  Relationships  . Social connections:    Talks on phone: Not on file    Gets  together: Not on file    Attends religious service: Not on file    Active member of club or organization: Not on file    Attends meetings of clubs or organizations: Not on file    Relationship status: Not on file  . Intimate partner violence:    Fear of current or ex partner: Not on file    Emotionally abused: Not on file    Physically abused: Not on file    Forced sexual activity: Not on file  Other Topics Concern  . Not on file  Social History Narrative  . Not on file    FAMILY HISTORY:  Family History  Problem Relation Age of Onset  . Stroke Mother   . Cancer Maternal Aunt        cancer NOS; died  in her 77s  . Lung cancer Maternal Uncle        smoker    CURRENT MEDICATIONS:  Outpatient Encounter Medications as of 01/19/2019  Medication Sig Note  . acyclovir (ZOVIRAX) 800 MG tablet Take 1 tablet (800 mg total) by mouth 2 (two) times daily.   Marland Kitchen ALPRAZolam (XANAX) 0.5 MG tablet TAKE 1 TABLET BY MOUTH AT BEDTIME AS NEEDED FOR ANXIETY (Patient taking differently: Take 0.5 mg by mouth at bedtime as needed for anxiety or sleep. TAKE 1 TABLET BY MOUTH AT BEDTIME AS NEEDED FOR ANXIETY)   . amLODipine (NORVASC) 5 MG tablet Take 1 tablet (5 mg total) by mouth daily.   Marland Kitchen aspirin EC 81 MG tablet Take 81 mg by mouth daily.   . calcium-vitamin D (OSCAL WITH D) 500-200 MG-UNIT TABS tablet Take 2 tablets by mouth daily. (Patient taking differently: Take 1 tablet by mouth 2 (two) times daily. )   . Cholecalciferol (VITAMIN D) 50 MCG (2000 UT) CAPS Take 4,000 Units by mouth 2 (two) times daily.   Marland Kitchen denosumab (XGEVA) 120 MG/1.7ML SOLN injection Inject 120 mg into the skin once. 01/18/2019: [place]   . folic acid (FOLVITE) 1 MG tablet Take 1 mg by mouth daily.    Marland Kitchen gabapentin (NEURONTIN) 300 MG capsule Take 1 capsule (300 mg total) by mouth 2 (two) times daily. Take two times a day, then increase to three times a day in a few weeks. (Patient taking differently: Take 300 mg by mouth 2 (two)  times daily. )   . hydrALAZINE (APRESOLINE) 50 MG tablet Take 1 tablet (50 mg total) by mouth every 8 (eight) hours. (Patient taking differently: Take 50 mg by mouth 2 (two) times daily. )   . loratadine (CLARITIN) 10 MG tablet Take 1 tablet (10 mg total) by mouth daily.   . metoprolol succinate (TOPROL-XL) 100 MG 24 hr tablet Take 100 mg by mouth daily.    . Multiple Vitamin (MULTIVITAMIN WITH MINERALS) TABS tablet Take 1 tablet by mouth daily.   . ondansetron (ZOFRAN) 8 MG tablet Take 8 mg by mouth every 8 (eight) hours as needed for nausea or vomiting.   . pantoprazole (PROTONIX) 40 MG tablet Take 40 mg by mouth every other day.    . polyethylene glycol (MIRALAX / GLYCOLAX) packet Take 17 g by mouth daily as needed for mild constipation.   . pseudoephedrine-guaifenesin (MUCINEX D) 60-600 MG 12 hr tablet Take 1 tablet by mouth 2 (two) times daily as needed for congestion.    . tamoxifen (NOLVADEX) 20 MG tablet Take 1 tablet (20 mg total) by mouth daily.   . vitamin B-12 (CYANOCOBALAMIN) 1000 MCG tablet Take 1,000 mcg by mouth daily.   . [DISCONTINUED] albuterol (PROVENTIL HFA;VENTOLIN HFA) 108 (90 Base) MCG/ACT inhaler Inhale 2 puffs into the lungs every 6 (six) hours as needed for wheezing or shortness of breath.   . [DISCONTINUED] docusate sodium (COLACE) 100 MG capsule Take 1 capsule (100 mg total) by mouth 2 (two) times daily as needed.    Facility-Administered Encounter Medications as of 01/19/2019  Medication  . heparin lock flush 100 unit/mL  . sodium chloride flush (NS) 0.9 % injection 10 mL  . sodium chloride flush (NS) 0.9 % injection 10 mL    ALLERGIES:  Allergies  Allergen Reactions  . Cefepime     Suspected severe thrombocytopenia is a result of cefepime induced antigen platelet destruction     PHYSICAL EXAM:  ECOG Performance status:  1  Vitals:   01/19/19 1000  BP: (!) 118/58  Pulse: 71  Resp: 16  Temp: 98.8 F (37.1 C)  SpO2: 98%   Filed Weights   01/19/19  1000  Weight: 202 lb (91.6 kg)    Physical Exam Constitutional:      Appearance: Normal appearance. He is normal weight.  Cardiovascular:     Rate and Rhythm: Normal rate and regular rhythm.     Heart sounds: Normal heart sounds.  Pulmonary:     Effort: Pulmonary effort is normal.     Breath sounds: Normal breath sounds.  Musculoskeletal: Normal range of motion.  Skin:    General: Skin is warm and dry.  Neurological:     Mental Status: He is alert and oriented to person, place, and time. Mental status is at baseline.  Psychiatric:        Mood and Affect: Mood normal.        Behavior: Behavior normal.        Thought Content: Thought content normal.        Judgment: Judgment normal.      LABORATORY DATA:  I have reviewed the labs as listed.  CBC    Component Value Date/Time   WBC 6.3 01/19/2019 0951   RBC 2.63 (L) 01/19/2019 0951   HGB 8.6 (L) 01/19/2019 0951   HCT 27.6 (L) 01/19/2019 0951   PLT 203 01/19/2019 0951   MCV 104.9 (H) 01/19/2019 0951   MCH 32.7 01/19/2019 0951   MCHC 31.2 01/19/2019 0951   RDW 18.6 (H) 01/19/2019 0951   LYMPHSABS 0.9 01/19/2019 0951   MONOABS 0.4 01/19/2019 0951   EOSABS 0.4 01/19/2019 0951   BASOSABS 0.0 01/19/2019 0951   CMP Latest Ref Rng & Units 01/19/2019 01/05/2019 12/26/2018  Glucose 70 - 99 mg/dL 111(H) 89 115(H)  BUN 8 - 23 mg/dL 19 21 22   Creatinine 0.61 - 1.24 mg/dL 1.82(H) 1.64(H) 2.03(H)  Sodium 135 - 145 mmol/L 138 140 137  Potassium 3.5 - 5.1 mmol/L 4.0 4.5 4.0  Chloride 98 - 111 mmol/L 108 107 104  CO2 22 - 32 mmol/L 23 26 26   Calcium 8.9 - 10.3 mg/dL 8.6(L) 9.1 9.9  Total Protein 6.5 - 8.1 g/dL 6.2(L) 7.2 6.6  Total Bilirubin 0.3 - 1.2 mg/dL 0.6 0.4 0.5  Alkaline Phos 38 - 126 U/L 25(L) 54 28(L)  AST 15 - 41 U/L 14(L) 28 24  ALT 0 - 44 U/L 13 35 24       DIAGNOSTIC IMAGING:  I have independently reviewed the scans and discussed with the patient.   I have reviewed Francene Finders, NP's note and agree with the  documentation.  I personally performed a face-to-face visit, made revisions and my assessment and plan is as follows.    ASSESSMENT & PLAN:   Multiple myeloma ([place]) 1.  Stage II IgG kappa multiple myeloma (Dx February 2018): -RVD from 03/01/2017 through 06/29/2017 -Chemotherapy changed to 3 cycles of CCyD from 07/12/2017-04/2017 to obtain deeper response - Status post auto stem cell transplant on 10/08/2017 -Stable M spike after transplant, started on CCyD, cycle 1 on 02/14/2018, cycle 2 on 03/14/2018 -His M spike after 2 cycles was undetectable, when measured at [facility] last week.  He was recommended to have maintenance with carfilzomib 70 mg/m square on days 1 and 15 every 28 days by Dr. Norma Fredrickson. -He received his first carfilzomib 62m/m2 on 04/20/2018, developed fever with chills 3 hours later with weakness in the extremities.  He was admitted to the hospital, received 1 unit of blood transfusion.  Blood cultures turned out to be negative.  He was febrile after hospitalization and was sent home. - Subsequently he tolerated carfilzomib and smaller doses very well.  We have increased his doses to 60 mg/m on 09/01/2018 and he tolerated 4 doses very well with the addition of dexamethasone. - We have increased his carfilzomib to 70 mg/m on 10/27/2018.  He tolerated it well for first 2 treatments. - Last treatment of high-dose carfilzomib was on 11/28/2018. -He was hospitalized on 11/29/2018 through 12/05/2018 with bronchitis, positive for parainfluenza, acute renal failure and severe thrombocytopenia.  His platelet count went down to 9.  We have given IVIG 1 g/kg x 2 doses.  Platelet count has slowly improved. - I have reviewed literature.  Carfilzomib can cause dose-related thrombotic microangiopathy.  Peripheral blood smear when he was hospitalized did not show significant schistocytes. -I had a discussion with Dr. Norma Fredrickson about these findings.  We have agreed to start him on lower dose of  carfilzomib.  He has tolerated lower doses in the past very well. - Carfilzomib was restarted at a dose of 35 mg/m on 01/05/2019.  Patient tolerated it very well.  He did not have any fevers or infections. - He did feel weak in his extremities for 2 days.  Today he feels much better. -I have reviewed his blood work.  M spike on 01/05/2018 was 0.1 g.  Free light chain ratio has improved to 1.79 from 2.26.  Creatinine is stable around 1.2.  Calcium was normal at 8.6. - We may proceed with his next dose of carfilzomib at the same dose. - He is apparently having cataract surgery on the left eye on 02/03/2019.  We will hold off on his treatment that week. -We will see him back in 3 weeks and initiate his next treatment.  2.  Metastatic left breast cancer to the bones (Dx 2011): -he is status post lumpectomy and was started on tamoxifen. -he will continue tamoxifen.  3.  Peripheral neuropathy: His neuropathy has been stable.  He is continuing gabapentin 300 mg twice daily.  4.  Infection prophylaxis: He will continue acyclovir 400 twice daily.  5.  Bone strengthening: -He will continue receiving denosumab monthly.  6.  Macrocytic anemia: - His hemoglobin is around 8.6.  Baseline hemoglobin is between 10 and 11. -We will check his ferritin, iron panel, M38 and folic acid today. - If they are normal we will consider starting him on Procrit.  We talked about side effects in detail. -If the iron is low, we will give him Feraheme infusion.  We talked about the side effects including anaphylactic reactions.      Orders placed this encounter:  Orders Placed This Encounter  Procedures  . Ferritin  . Iron and TIBC  . Vitamin B12  . Folate      Derek Jack, MD [place] [phone]

## 2019-01-19 NOTE — Assessment & Plan Note (Signed)
1.  Stage II IgG kappa multiple myeloma (Dx February 2018): -RVD from 03/01/2017 through 06/29/2017 -Chemotherapy changed to 3 cycles of CCyD from 07/12/2017-04/2017 to obtain deeper response - Status post auto stem cell transplant on 10/08/2017 -Stable M spike after transplant, started on CCyD, cycle 1 on 02/14/2018, cycle 2 on 03/14/2018 -His M spike after 2 cycles was undetectable, when measured at [facility] last week.  He was recommended to have maintenance with carfilzomib 70 mg/m square on days 1 and 15 every 28 days by Dr. Norma Fredrickson. -He received his first carfilzomib 74m/m2 on 04/20/2018, developed fever with chills 3 hours later with weakness in the extremities.  He was admitted to the hospital, received 1 unit of blood transfusion.  Blood cultures turned out to be negative.  He was febrile after hospitalization and was sent home. - Subsequently he tolerated carfilzomib and smaller doses very well.  We have increased his doses to 60 mg/m on 09/01/2018 and he tolerated 4 doses very well with the addition of dexamethasone. - We have increased his carfilzomib to 70 mg/m on 10/27/2018.  He tolerated it well for first 2 treatments. - Last treatment of high-dose carfilzomib was on 11/28/2018. -He was hospitalized on 11/29/2018 through 12/05/2018 with bronchitis, positive for parainfluenza, acute renal failure and severe thrombocytopenia.  His platelet count went down to 9.  We have given IVIG 1 g/kg x 2 doses.  Platelet count has slowly improved. - I have reviewed literature.  Carfilzomib can cause dose-related thrombotic microangiopathy.  Peripheral blood smear when he was hospitalized did not show significant schistocytes. -I had a discussion with Dr. RNorma Fredricksonabout these findings.  We have agreed to start him on lower dose of carfilzomib.  He has tolerated lower doses in the past very well. - Carfilzomib was restarted at a dose of 35 mg/m on 01/05/2019.  Patient tolerated it very well.  He did not have any  fevers or infections. - He did feel weak in his extremities for 2 days.  Today he feels much better. -I have reviewed his blood work.  M spike on 01/05/2018 was 0.1 g.  Free light chain ratio has improved to 1.79 from 2.26.  Creatinine is stable around 1.2.  Calcium was normal at 8.6. - We may proceed with his next dose of carfilzomib at the same dose. - He is apparently having cataract surgery on the left eye on 02/03/2019.  We will hold off on his treatment that week. -We will see him back in 3 weeks and initiate his next treatment.  2.  Metastatic left breast cancer to the bones (Dx 2011): -he is status post lumpectomy and was started on tamoxifen. -he will continue tamoxifen.  3.  Peripheral neuropathy: His neuropathy has been stable.  He is continuing gabapentin 300 mg twice daily.  4.  Infection prophylaxis: He will continue acyclovir 400 twice daily.  5.  Bone strengthening: -He will continue receiving denosumab monthly.  6.  Macrocytic anemia: - His hemoglobin is around 8.6.  Baseline hemoglobin is between 10 and 11. -We will check his ferritin, iron panel, BA07and folic acid today. - If they are normal we will consider starting him on Procrit.  We talked about side effects in detail. -If the iron is low, we will give him Feraheme infusion.  We talked about the side effects including anaphylactic reactions.

## 2019-01-19 NOTE — Progress Notes (Signed)
Patient is ready for treatment labs were reviewed.

## 2019-01-19 NOTE — Progress Notes (Signed)
Labs reviewed by MD at office visit today. Will proceed with treatment today per MD. Creatinine 1.82 noted.   Late entry- 1430.  Treatment given per orders. Patient tolerated it well without problems. Vitals stable and discharged home from clinic ambulatory. Follow up as scheduled.

## 2019-01-19 NOTE — Patient Instructions (Signed)
[facility] at [facility] Discharge Instructions     Thank you for choosing [facility] at [facility] to provide your oncology and hematology care.  To afford each patient quality time with our provider, please arrive at least 15 minutes before your scheduled appointment time.   If you have a lab appointment with the Cancer Center please come in thru the  Main Entrance and check in at the main information desk  You need to re-schedule your appointment should you arrive 10 or more minutes late.  We strive to give you quality time with our providers, and arriving late affects you and other patients whose appointments are after yours.  Also, if you no show three or more times for appointments you may be dismissed from the clinic at the providers discretion.     Again, thank you for choosing [facility].  Our hope is that these requests will decrease the amount of time that you wait before being seen by our physicians.       _____________________________________________________________  Should you have questions after your visit to [facility], please contact our office at [phone] between the hours of 8:00 a.m. and 4:30 p.m.  Voicemails left after 4:00 p.m. will not be returned until the following business day.  For prescription refill requests, have your pharmacy contact our office and allow 72 hours.    Cancer Center Support Programs:   > Cancer Support Group  2nd Tuesday of the month 1pm-2pm, Journey Room    

## 2019-01-20 LAB — PROTEIN ELECTROPHORESIS, SERUM
A/G Ratio: 1.6 (ref 0.7–1.7)
ALPHA-2-GLOBULIN: 0.6 g/dL (ref 0.4–1.0)
Albumin ELP: 3.4 g/dL (ref 2.9–4.4)
Alpha-1-Globulin: 0.2 g/dL (ref 0.0–0.4)
Beta Globulin: 0.7 g/dL (ref 0.7–1.3)
GLOBULIN, TOTAL: 2.1 g/dL — AB (ref 2.2–3.9)
Gamma Globulin: 0.6 g/dL (ref 0.4–1.8)
M-Spike, %: 0.1 g/dL — ABNORMAL HIGH
Total Protein ELP: 5.5 g/dL — ABNORMAL LOW (ref 6.0–8.5)

## 2019-01-20 LAB — KAPPA/LAMBDA LIGHT CHAINS
Kappa free light chain: 14 mg/L (ref 3.3–19.4)
Kappa, lambda light chain ratio: 2.46 — ABNORMAL HIGH (ref 0.26–1.65)
LAMDA FREE LIGHT CHAINS: 5.7 mg/L (ref 5.7–26.3)

## 2019-01-23 ENCOUNTER — Other Ambulatory Visit (HOSPITAL_COMMUNITY): Payer: Self-pay | Admitting: *Deleted

## 2019-01-23 DIAGNOSIS — C50922 Malignant neoplasm of unspecified site of left male breast: Secondary | ICD-10-CM

## 2019-01-23 DIAGNOSIS — Z170 Estrogen receptor positive status [ER+]: Principal | ICD-10-CM

## 2019-01-23 DIAGNOSIS — C9000 Multiple myeloma not having achieved remission: Secondary | ICD-10-CM

## 2019-01-23 MED ORDER — ALPRAZOLAM 0.5 MG PO TABS: 0.5000 mg | ORAL_TABLET | Freq: Every evening | ORAL | 3 refills | Status: DC | PRN

## 2019-01-23 MED ORDER — TAMOXIFEN CITRATE 20 MG PO TABS: 20.0000 mg | ORAL_TABLET | Freq: Every day | ORAL | 3 refills | Status: DC

## 2019-01-27 DIAGNOSIS — H25812 Combined forms of age-related cataract, left eye: Secondary | ICD-10-CM | POA: Diagnosis not present

## 2019-01-30 ENCOUNTER — Other Ambulatory Visit: Payer: Self-pay

## 2019-01-30 ENCOUNTER — Encounter (HOSPITAL_COMMUNITY)
Admission: RE | Admit: 2019-01-30 | Discharge: 2019-01-30 | Disposition: A | Discharge status: Home / Self Care | Payer: Medicare Other | Source: Ambulatory Visit | Attending: Ophthalmology | Admitting: Ophthalmology

## 2019-01-30 ENCOUNTER — Encounter (HOSPITAL_COMMUNITY): Payer: Self-pay

## 2019-02-02 ENCOUNTER — Ambulatory Visit (HOSPITAL_COMMUNITY): Payer: Medicare Other

## 2019-02-02 ENCOUNTER — Other Ambulatory Visit (HOSPITAL_COMMUNITY): Payer: Medicare Other

## 2019-02-03 ENCOUNTER — Ambulatory Visit (HOSPITAL_COMMUNITY): Payer: Medicare Other | Admitting: Anesthesiology

## 2019-02-03 ENCOUNTER — Encounter (HOSPITAL_COMMUNITY): Payer: Self-pay

## 2019-02-03 ENCOUNTER — Ambulatory Visit (HOSPITAL_COMMUNITY)
Admission: RE | Admit: 2019-02-03 | Discharge: 2019-02-03 | Disposition: A | Discharge status: Home / Self Care | Payer: Medicare Other | Attending: Ophthalmology | Admitting: Ophthalmology

## 2019-02-03 ENCOUNTER — Ambulatory Visit (HOSPITAL_COMMUNITY): Payer: Medicare Other

## 2019-02-03 ENCOUNTER — Encounter (HOSPITAL_COMMUNITY)
Admission: RE | Disposition: A | Discharge status: Home / Self Care | Payer: Self-pay | Source: Home / Self Care | Attending: Ophthalmology

## 2019-02-03 DIAGNOSIS — I10 Essential (primary) hypertension: Secondary | ICD-10-CM | POA: Diagnosis not present

## 2019-02-03 DIAGNOSIS — Z79810 Long term (current) use of selective estrogen receptor modulators (SERMs): Secondary | ICD-10-CM | POA: Diagnosis not present

## 2019-02-03 DIAGNOSIS — H259 Unspecified age-related cataract: Secondary | ICD-10-CM | POA: Diagnosis not present

## 2019-02-03 DIAGNOSIS — C9000 Multiple myeloma not having achieved remission: Secondary | ICD-10-CM | POA: Insufficient documentation

## 2019-02-03 DIAGNOSIS — Z853 Personal history of malignant neoplasm of breast: Secondary | ICD-10-CM | POA: Insufficient documentation

## 2019-02-03 DIAGNOSIS — F419 Anxiety disorder, unspecified: Secondary | ICD-10-CM | POA: Diagnosis not present

## 2019-02-03 DIAGNOSIS — K219 Gastro-esophageal reflux disease without esophagitis: Secondary | ICD-10-CM | POA: Diagnosis not present

## 2019-02-03 DIAGNOSIS — J302 Other seasonal allergic rhinitis: Secondary | ICD-10-CM | POA: Insufficient documentation

## 2019-02-03 DIAGNOSIS — Z7982 Long term (current) use of aspirin: Secondary | ICD-10-CM | POA: Insufficient documentation

## 2019-02-03 DIAGNOSIS — H25812 Combined forms of age-related cataract, left eye: Secondary | ICD-10-CM | POA: Diagnosis not present

## 2019-02-03 DIAGNOSIS — Z79899 Other long term (current) drug therapy: Secondary | ICD-10-CM | POA: Insufficient documentation

## 2019-02-03 HISTORY — PX: CATARACT EXTRACTION W/PHACO: SHX586

## 2019-02-03 SURGERY — PHACOEMULSIFICATION, CATARACT, WITH IOL INSERTION
Anesthesia: Monitor Anesthesia Care | Site: Eye | Laterality: Left

## 2019-02-03 MED ORDER — SODIUM HYALURONATE 23 MG/ML IO SOLN
INTRAOCULAR | Status: DC | PRN
  Administered 2019-02-03: 0.6 mL via INTRAOCULAR

## 2019-02-03 MED ORDER — PROVISC 10 MG/ML IO SOLN
INTRAOCULAR | Status: DC | PRN
  Administered 2019-02-03: 0.85 mL via INTRAOCULAR

## 2019-02-03 MED ORDER — MIDAZOLAM HCL 2 MG/2ML IJ SOLN
INTRAMUSCULAR | Status: AC
  Filled 2019-02-03: qty 2

## 2019-02-03 MED ORDER — EPINEPHRINE PF 1 MG/ML IJ SOLN
INTRAOCULAR | Status: DC | PRN
  Administered 2019-02-03: 500 mL

## 2019-02-03 MED ORDER — MIDAZOLAM HCL 2 MG/2ML IJ SOLN
INTRAMUSCULAR | Status: DC | PRN
  Administered 2019-02-03 (×2): 1 mg via INTRAVENOUS

## 2019-02-03 MED ORDER — POVIDONE-IODINE 5 % OP SOLN
OPHTHALMIC | Status: DC | PRN
  Administered 2019-02-03: 1 via OPHTHALMIC

## 2019-02-03 MED ORDER — LIDOCAINE HCL (PF) 1 % IJ SOLN
INTRAOCULAR | Status: DC | PRN
  Administered 2019-02-03: .9 mL via OPHTHALMIC

## 2019-02-03 MED ORDER — CYCLOPENTOLATE-PHENYLEPHRINE 0.2-1 % OP SOLN
1.0000 [drp] | OPHTHALMIC | Status: AC
  Administered 2019-02-03 (×3): 1 [drp] via OPHTHALMIC

## 2019-02-03 MED ORDER — NEOMYCIN-POLYMYXIN-DEXAMETH 3.5-10000-0.1 OP SUSP
OPHTHALMIC | Status: DC | PRN
  Administered 2019-02-03: 2 [drp] via OPHTHALMIC

## 2019-02-03 MED ORDER — PHENYLEPHRINE HCL 2.5 % OP SOLN
1.0000 [drp] | OPHTHALMIC | Status: AC
  Administered 2019-02-03 (×3): 1 [drp] via OPHTHALMIC

## 2019-02-03 MED ORDER — TETRACAINE HCL 0.5 % OP SOLN
1.0000 [drp] | OPHTHALMIC | Status: AC
  Administered 2019-02-03 (×3): 1 [drp] via OPHTHALMIC

## 2019-02-03 MED ORDER — LIDOCAINE HCL 3.5 % OP GEL
1.0000 "application " | Freq: Once | OPHTHALMIC | Status: AC
  Administered 2019-02-03: 1 via OPHTHALMIC

## 2019-02-03 MED ORDER — BSS IO SOLN
INTRAOCULAR | Status: DC | PRN
  Administered 2019-02-03: 15 mL

## 2019-02-03 SURGICAL SUPPLY — 13 items
CLOTH BEACON ORANGE TIMEOUT ST (SAFETY) ×2 IMPLANT
EYE SHIELD UNIVERSAL CLEAR (GAUZE/BANDAGES/DRESSINGS) ×2 IMPLANT
GLOVE BIOGEL PI IND STRL 7.0 (GLOVE) IMPLANT
GLOVE BIOGEL PI INDICATOR 7.0 (GLOVE) ×2
LENS ALC ACRYL/TECN (Ophthalmic Related) ×2 IMPLANT
NDL HYPO 18GX1.5 BLUNT FILL (NEEDLE) IMPLANT
NEEDLE HYPO 18GX1.5 BLUNT FILL (NEEDLE) ×3 IMPLANT
PAD ARMBOARD 7.5X6 YLW CONV (MISCELLANEOUS) ×2 IMPLANT
SYR TB 1ML LL NO SAFETY (SYRINGE) ×2 IMPLANT
TAPE SURG TRANSPORE 1 IN (GAUZE/BANDAGES/DRESSINGS) IMPLANT
TAPE SURGICAL TRANSPORE 1 IN (GAUZE/BANDAGES/DRESSINGS) ×2
VISCOELASTIC ADDITIONAL (OPHTHALMIC RELATED) ×2 IMPLANT
WATER STERILE IRR 250ML POUR (IV SOLUTION) ×2 IMPLANT

## 2019-02-03 NOTE — H&P (Signed)
The H and P was reviewed and updated. The patient was examined.  No changes were found after exam.  The surgical eye was marked.  

## 2019-02-03 NOTE — Transfer of Care (Signed)
Immediate Anesthesia Transfer of Care Note  Patient: Johnny Lucas  Procedure(s) Performed: CATARACT EXTRACTION PHACO AND INTRAOCULAR LENS PLACEMENT (IOC) (Left Eye)  Patient Location: Short Stay  Anesthesia Type:MAC  Level of Consciousness: awake, alert  and patient cooperative  Airway & Oxygen Therapy: Patient Spontanous Breathing  Post-op Assessment: Report given to RN and Post -op Vital signs reviewed and stable  Post vital signs: Reviewed and stable  Last Vitals:  Vitals Value Taken Time  BP    Temp    Pulse    Resp    SpO2      Last Pain:  Vitals:   02/03/19 0933  TempSrc: Oral  PainSc: 0-No pain      Patients Stated Pain Goal: 7 (30/09/23 3007)  Complications: No apparent anesthesia complications

## 2019-02-03 NOTE — Anesthesia Preprocedure Evaluation (Signed)
Anesthesia Evaluation    Airway Mallampati: I       Dental  (+) Teeth Intact   Pulmonary pneumonia, resolved,    breath sounds clear to auscultation       Cardiovascular hypertension, On Medications  Rhythm:regular     Neuro/Psych Anxiety TIA Neuromuscular disease    GI/Hepatic GERD  ,  Endo/Other    Renal/GU CRF     Musculoskeletal   Abdominal   Peds  Hematology   Anesthesia Other Findings Multiple myeloma   Reproductive/Obstetrics                             Anesthesia Physical Anesthesia Plan  ASA: III  Anesthesia Plan: MAC   Post-op Pain Management:    Induction:   PONV Risk Score and Plan:   Airway Management Planned:   Additional Equipment:   Intra-op Plan:   Post-operative Plan:   Informed Consent:   Plan Discussed with: Anesthesiologist  Anesthesia Plan Comments:         Anesthesia Quick Evaluation  

## 2019-02-03 NOTE — Anesthesia Postprocedure Evaluation (Signed)
Anesthesia Post Note  Patient: Johnny Lucas  Procedure(s) Performed: CATARACT EXTRACTION PHACO AND INTRAOCULAR LENS PLACEMENT ([place]) (Left Eye)  Patient location during evaluation: Short Stay Anesthesia Type: MAC Level of consciousness: awake and alert and patient cooperative Pain management: satisfactory to patient Vital Signs Assessment: post-procedure vital signs reviewed and stable Respiratory status: spontaneous breathing Cardiovascular status: stable Postop Assessment: no apparent nausea or vomiting Anesthetic complications: no     Last Vitals:  Vitals:   02/03/19 0933  BP: 113/73  Pulse: 78  Resp: 16  Temp: 36.6 C  SpO2: 97%    Last Pain:  Vitals:   02/03/19 0933  TempSrc: Oral  PainSc: 0-No pain                 Elynn Patteson

## 2019-02-03 NOTE — Discharge Instructions (Addendum)
Please discharge patient when stable, will follow up today with Dr. Wrzosek at the [facility] office immediately following discharge.  Leave shield in place until visit.  All paperwork with discharge instructions will be given at the office. ° ° ° ° ° °Monitored Anesthesia Care, Care After °These instructions provide you with information about caring for yourself after your procedure. Your health care provider may also give you more specific instructions. Your treatment has been planned according to current medical practices, but problems sometimes occur. Call your health care provider if you have any problems or questions after your procedure. °What can I expect after the procedure? °After your procedure, you may: °· Feel sleepy for several hours. °· Feel clumsy and have poor balance for several hours. °· Feel forgetful about what happened after the procedure. °· Have poor judgment for several hours. °· Feel nauseous or vomit. °· Have a sore throat if you had a breathing tube during the procedure. °Follow these instructions at home: °For at least 24 hours after the procedure: ° °  ° °· Have a responsible adult stay with you. It is important to have someone help care for you until you are awake and alert. °· Rest as needed. °· Do not: °? Participate in activities in which you could fall or become injured. °? Drive. °? Use heavy machinery. °? Drink alcohol. °? Take sleeping pills or medicines that cause drowsiness. °? Make important decisions or sign legal documents. °? Take care of children on your own. °Eating and drinking °· Follow the diet that is recommended by your health care provider. °· If you vomit, drink water, juice, or soup when you can drink without vomiting. °· Make sure you have little or no nausea before eating solid foods. °General instructions °· Take over-the-counter and prescription medicines only as told by your health care provider. °· If you have sleep apnea, surgery and certain  medicines can increase your risk for breathing problems. Follow instructions from your health care provider about wearing your sleep device: °? Anytime you are sleeping, including during daytime naps. °? While taking prescription pain medicines, sleeping medicines, or medicines that make you drowsy. °· If you smoke, do not smoke without supervision. °· Keep all follow-up visits as told by your health care provider. This is important. °Contact a health care provider if: °· You keep feeling nauseous or you keep vomiting. °· You feel light-headed. °· You develop a rash. °· You have a fever. °Get help right away if: °· You have trouble breathing. °Summary °· For several hours after your procedure, you may feel sleepy and have poor judgment. °· Have a responsible adult stay with you for at least 24 hours or until you are awake and alert. °This information is not intended to replace advice given to you by your health care provider. Make sure you discuss any questions you have with your health care provider. °Document Released: 04/05/2016 Document Revised: 07/30/2017 Document Reviewed: 04/05/2016 °Elsevier Interactive Patient Education © 2019 [employer] ° °

## 2019-02-03 NOTE — Op Note (Signed)
Date of procedure: 02/03/19  Pre-operative diagnosis: Visually significant age-related cataract, Left Eye (H25.812)  Post-operative diagnosis: Visually significant age-related cataract, Left Eye  Procedure: Removal of cataract via phacoemulsification and insertion of intra-ocular lens [employer] PCB00  +23.5D into the capsular bag of the Left Eye  Attending surgeon: Gerda Diss. Kindal Ponti, MD, MA  Anesthesia: MAC, Topical Akten  Complications: None  Estimated Blood Loss: <76m (minimal)  Specimens: None  Implants: As above  Indications:  Visually significant age-related cataract, Left Eye  Procedure:  The patient was seen and identified in the pre-operative area. The operative eye was identified and dilated.  The operative eye was marked.  Topical anesthesia was administered to the operative eye.     The patient was then to the operative suite and placed in the supine position.  A timeout was performed confirming the patient, procedure to be performed, and all other relevant information.   The patient's face was prepped and draped in the usual fashion for intra-ocular surgery.  A lid speculum was placed into the operative eye and the surgical microscope moved into place and focused.  An inferotemporal paracentesis was created using a 20 gauge paracentesis blade.  Shugarcaine was injected into the anterior chamber.  Viscoelastic was injected into the anterior chamber.  A temporal clear-corneal main wound incision was created using a 2.410mmicrokeratome.  A continuous curvilinear capsulorrhexis was initiated using an irrigating cystitome and completed using capsulorrhexis forceps.  Hydrodissection and hydrodeliniation were performed.  Viscoelastic was injected into the anterior chamber.  A phacoemulsification handpiece and a chopper as a second instrument were used to remove the nucleus and epinucleus. The irrigation/aspiration handpiece was used to remove any remaining cortical  material.   The capsular bag was reinflated with viscoelastic, checked, and found to be intact.  The intraocular lens was inserted into the capsular bag and dialed into place using a Kuglen hook.  The irrigation/aspiration handpiece was used to remove any remaining viscoelastic.  The clear corneal wound and paracentesis wounds were then hydrated and checked with Weck-Cels to be watertight.  The lid-speculum and drape was removed, and the patient's face was cleaned with a wet and dry 4x4.  Maxitrol was instilled in the eye before a clear shield was taped over the eye. The patient was taken to the post-operative care unit in good condition, having tolerated the procedure well.  Post-Op Instructions: [facility] and will receive all other orders and instructions.

## 2019-02-03 NOTE — Anesthesia Procedure Notes (Signed)
Procedure Name: MAC Date/Time: 02/03/2019 10:42 AM Performed by: Vista Deck, CRNA Pre-anesthesia Checklist: Patient identified, Emergency Drugs available, Suction available, Timeout performed and Patient being monitored Patient Re-evaluated:Patient Re-evaluated prior to induction Oxygen Delivery Method: Nasal Cannula

## 2019-02-06 ENCOUNTER — Encounter (HOSPITAL_COMMUNITY): Payer: Self-pay | Admitting: Ophthalmology

## 2019-02-10 ENCOUNTER — Inpatient Hospital Stay (HOSPITAL_COMMUNITY): Payer: Medicare Other | Attending: Hematology

## 2019-02-10 ENCOUNTER — Inpatient Hospital Stay (HOSPITAL_COMMUNITY): Payer: Medicare Other

## 2019-02-10 ENCOUNTER — Encounter (HOSPITAL_COMMUNITY): Payer: Self-pay | Admitting: *Deleted

## 2019-02-10 ENCOUNTER — Inpatient Hospital Stay (HOSPITAL_BASED_OUTPATIENT_CLINIC_OR_DEPARTMENT_OTHER): Payer: Medicare Other | Admitting: Hematology

## 2019-02-10 ENCOUNTER — Encounter (HOSPITAL_COMMUNITY): Payer: Self-pay | Admitting: Hematology

## 2019-02-10 ENCOUNTER — Other Ambulatory Visit: Payer: Self-pay

## 2019-02-10 VITALS — BP 121/62 | HR 68 | Temp 97.8°F | Resp 18

## 2019-02-10 DIAGNOSIS — D539 Nutritional anemia, unspecified: Secondary | ICD-10-CM | POA: Insufficient documentation

## 2019-02-10 DIAGNOSIS — C50122 Malignant neoplasm of central portion of left male breast: Secondary | ICD-10-CM

## 2019-02-10 DIAGNOSIS — I2510 Atherosclerotic heart disease of native coronary artery without angina pectoris: Secondary | ICD-10-CM | POA: Diagnosis not present

## 2019-02-10 DIAGNOSIS — Z79899 Other long term (current) drug therapy: Secondary | ICD-10-CM | POA: Insufficient documentation

## 2019-02-10 DIAGNOSIS — C50822 Malignant neoplasm of overlapping sites of left male breast: Secondary | ICD-10-CM | POA: Diagnosis not present

## 2019-02-10 DIAGNOSIS — C7951 Secondary malignant neoplasm of bone: Secondary | ICD-10-CM | POA: Diagnosis not present

## 2019-02-10 DIAGNOSIS — K219 Gastro-esophageal reflux disease without esophagitis: Secondary | ICD-10-CM

## 2019-02-10 DIAGNOSIS — Z5111 Encounter for antineoplastic chemotherapy: Secondary | ICD-10-CM | POA: Diagnosis not present

## 2019-02-10 DIAGNOSIS — R5383 Other fatigue: Secondary | ICD-10-CM | POA: Diagnosis not present

## 2019-02-10 DIAGNOSIS — Z7982 Long term (current) use of aspirin: Secondary | ICD-10-CM | POA: Insufficient documentation

## 2019-02-10 DIAGNOSIS — F419 Anxiety disorder, unspecified: Secondary | ICD-10-CM

## 2019-02-10 DIAGNOSIS — I700 Atherosclerosis of aorta: Secondary | ICD-10-CM | POA: Diagnosis not present

## 2019-02-10 DIAGNOSIS — Z923 Personal history of irradiation: Secondary | ICD-10-CM | POA: Insufficient documentation

## 2019-02-10 DIAGNOSIS — Z79810 Long term (current) use of selective estrogen receptor modulators (SERMs): Secondary | ICD-10-CM | POA: Diagnosis not present

## 2019-02-10 DIAGNOSIS — Z170 Estrogen receptor positive status [ER+]: Secondary | ICD-10-CM | POA: Insufficient documentation

## 2019-02-10 DIAGNOSIS — I10 Essential (primary) hypertension: Secondary | ICD-10-CM | POA: Diagnosis not present

## 2019-02-10 DIAGNOSIS — C9000 Multiple myeloma not having achieved remission: Secondary | ICD-10-CM

## 2019-02-10 DIAGNOSIS — G629 Polyneuropathy, unspecified: Secondary | ICD-10-CM | POA: Insufficient documentation

## 2019-02-10 LAB — CBC WITH DIFFERENTIAL/PLATELET
Abs Immature Granulocytes: 0.01 10*3/uL (ref 0.00–0.07)
Basophils Absolute: 0 10*3/uL (ref 0.0–0.1)
Basophils Relative: 1 %
EOS ABS: 0.4 10*3/uL (ref 0.0–0.5)
Eosinophils Relative: 6 %
HCT: 27.3 % — ABNORMAL LOW (ref 39.0–52.0)
Hemoglobin: 8.8 g/dL — ABNORMAL LOW (ref 13.0–17.0)
Immature Granulocytes: 0 %
Lymphocytes Relative: 18 %
Lymphs Abs: 1 10*3/uL (ref 0.7–4.0)
MCH: 34.2 pg — AB (ref 26.0–34.0)
MCHC: 32.2 g/dL (ref 30.0–36.0)
MCV: 106.2 fL — ABNORMAL HIGH (ref 80.0–100.0)
Monocytes Absolute: 0.7 10*3/uL (ref 0.1–1.0)
Monocytes Relative: 13 %
Neutro Abs: 3.5 10*3/uL (ref 1.7–7.7)
Neutrophils Relative %: 62 %
Platelets: 175 10*3/uL (ref 150–400)
RBC: 2.57 MIL/uL — ABNORMAL LOW (ref 4.22–5.81)
RDW: 16.5 % — ABNORMAL HIGH (ref 11.5–15.5)
WBC: 5.5 10*3/uL (ref 4.0–10.5)
nRBC: 0 % (ref 0.0–0.2)

## 2019-02-10 LAB — COMPREHENSIVE METABOLIC PANEL
ALT: 14 U/L (ref 0–44)
AST: 14 U/L — ABNORMAL LOW (ref 15–41)
Albumin: 3.8 g/dL (ref 3.5–5.0)
Alkaline Phosphatase: 24 U/L — ABNORMAL LOW (ref 38–126)
Anion gap: 7 (ref 5–15)
BUN: 28 mg/dL — ABNORMAL HIGH (ref 8–23)
CHLORIDE: 105 mmol/L (ref 98–111)
CO2: 24 mmol/L (ref 22–32)
Calcium: 9.1 mg/dL (ref 8.9–10.3)
Creatinine, Ser: 1.79 mg/dL — ABNORMAL HIGH (ref 0.61–1.24)
GFR calc Af Amer: 44 mL/min — ABNORMAL LOW (ref >60)
GFR calc non Af Amer: 38 mL/min — ABNORMAL LOW (ref >60)
Glucose, Bld: 97 mg/dL (ref 70–99)
POTASSIUM: 4.1 mmol/L (ref 3.5–5.1)
Sodium: 136 mmol/L (ref 135–145)
Total Bilirubin: 0.2 mg/dL — ABNORMAL LOW (ref 0.3–1.2)
Total Protein: 6.2 g/dL — ABNORMAL LOW (ref 6.5–8.1)

## 2019-02-10 MED ORDER — SODIUM CHLORIDE 0.9 % IV SOLN
Freq: Once | INTRAVENOUS | Status: AC
  Administered 2019-02-10: 12:00:00 via INTRAVENOUS

## 2019-02-10 MED ORDER — EPOETIN ALFA 20000 UNIT/ML IJ SOLN
20000.0000 [IU] | Freq: Once | INTRAMUSCULAR | Status: AC
  Administered 2019-02-10: 20000 [IU] via SUBCUTANEOUS
  Filled 2019-02-10: qty 1

## 2019-02-10 MED ORDER — HEPARIN SOD (PORK) LOCK FLUSH 100 UNIT/ML IV SOLN
500.0000 [IU] | Freq: Once | INTRAVENOUS | Status: AC | PRN
  Administered 2019-02-10: 500 [IU]
  Filled 2019-02-10: qty 5

## 2019-02-10 MED ORDER — ACETAMINOPHEN 325 MG PO TABS
650.0000 mg | ORAL_TABLET | Freq: Once | ORAL | Status: AC
  Administered 2019-02-10: 650 mg via ORAL
  Filled 2019-02-10: qty 2

## 2019-02-10 MED ORDER — SODIUM CHLORIDE 0.9% FLUSH
10.0000 mL | INTRAVENOUS | Status: DC | PRN
  Administered 2019-02-10: 10 mL
  Filled 2019-02-10: qty 10

## 2019-02-10 MED ORDER — SODIUM CHLORIDE 0.9 % IV SOLN: 20.0000 mg | Freq: Once | INTRAVENOUS | Status: DC

## 2019-02-10 MED ORDER — DEXTROSE 5 % IV SOLN
34.0000 mg/m2 | Freq: Once | INTRAVENOUS | Status: AC
  Administered 2019-02-10: 70 mg via INTRAVENOUS
  Filled 2019-02-10: qty 30

## 2019-02-10 MED ORDER — SODIUM CHLORIDE 0.9 % IV SOLN
20.0000 mg | Freq: Once | INTRAVENOUS | Status: AC
  Administered 2019-02-10: 20 mg via INTRAVENOUS
  Filled 2019-02-10: qty 20

## 2019-02-10 MED ORDER — SODIUM CHLORIDE 0.9 % IV SOLN
Freq: Once | INTRAVENOUS | Status: AC
  Administered 2019-02-10: 11:00:00 via INTRAVENOUS

## 2019-02-10 NOTE — Assessment & Plan Note (Addendum)
1.  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  He was recommended to have maintenance with carfilzomib 70 mg/m square on days 1 and 15 every 28 days by Dr. Norma Fredrickson. -He received his first carfilzomib 39m/m2 on 04/20/2018, developed fever with chills 3 hours later with weakness in the extremities.  He was admitted to the hospital, received 1 unit of blood transfusion.  Blood cultures turned out to be negative.  He was febrile after hospitalization and was sent home. - Subsequently he tolerated carfilzomib and smaller doses very well.  We have increased his doses to 60 mg/m on 09/01/2018 and he tolerated 4 doses very well with the addition of dexamethasone. - We have increased his carfilzomib to 70 mg/m on 10/27/2018.  He tolerated it well for first 2 treatments. - Last treatment of high-dose carfilzomib was on 11/28/2018. -He was hospitalized on 11/29/2018 through 12/05/2018 with bronchitis, positive for parainfluenza, acute renal failure and severe thrombocytopenia.  His platelet count went down to 9.  We have given IVIG 1 g/kg x 2 doses.  Platelet count has slowly improved. - I have reviewed literature.  Carfilzomib can cause dose-related thrombotic microangiopathy.  Peripheral blood smear when he was hospitalized did not show significant schistocytes. -I had a discussion with Dr. RNorma Fredricksonabout these findings.  We have agreed to start him on lower dose of carfilzomib.  He has tolerated lower doses in the past very well. - Carfilzomib was restarted at a dose of 35 mg per metered square on 01/05/2019.  He tolerated it well.  - Last dose  was on 01/19/2019.  He did not have any fevers or infections after the last dose. -We discussed the myeloma panel from 01/19/2019 which shows M spike of 0.1 g.  This has been stable.  Free light chain ratio was 2.46.  Kappa light chains were 14. -We will continue carfilzomib at the same dose.  I will reevaluate him in 4 weeks to see how he is responding to Procrit.  2.  Metastatic left breast cancer to the bones (Dx 2011): -he is status post lumpectomy and was started on tamoxifen. -he will continue tamoxifen.  3.  Peripheral neuropathy: His neuropathy has been stable.  He is continuing gabapentin 300 mg twice daily.  4.  Infection prophylaxis: -We will continue acyclovir 400 mg twice daily.   5.  Bone strengthening: -He will continue denosumab monthly.  6.  Macrocytic anemia: - Hemoglobin is 8.8.  MCV was elevated at 106. -I have done work-up for microcytic anemia.  BT77and folic acid were normal.  Ferritin and percent saturation were also adequate. -We talked about initiating him on Procrit injections at this time.  Talked about side effects in detail.

## 2019-02-10 NOTE — Progress Notes (Signed)
[place], [place] [address]   CLINIC:  Medical Oncology/Hematology  PCP:  [name], MD [place] [address] [phone]   REASON FOR VISIT: Follow-up for multiple myelomaS/P stem cell transplant  CURRENT THERAPY:Carfilzomib every 2 weeks   BRIEF ONCOLOGIC HISTORY:    Breast cancer, male ([place])   12/31/2009 Initial Biopsy    Biopsy of L breast     12/31/2009 Pathology Results    Invasive ductal carcinoma, ER/PR+, HER 2 negative    12/31/2009 Imaging    Ultrasound showing a 2.43 x 1.85 x 3 cm hypoechoic spiculated mass in the 12 o clock L breast retroareolar region    01/01/2010 -  Anti-estrogen oral therapy    Tamoxifen 20 mg daily    01/06/2010 Imaging    Bone scan abnormal uptake in the diaphysis of the R humerus, abnormal in the R third, fifth and sixth ribs, lesion also noted in the sternum.    02/03/2010 Surgery    Rod placement and fixation of R humerus by Dr. [name]    02/05/2010 - 02/18/2010 Radiation Therapy    30Gy in 10 fractions of 3 Gy per fraction to R pathologic fracture    03/11/2010 -  Chemotherapy    Denosumab monthly, now every 3 months. Started at [facility]     06/09/2016 Imaging    Three hypermetabolic osseous lesions in the sternum, left ilium and right ilium, as discussed above, likely represent osseous metastases. At this time, these are not recognizable on the CT images. 2. No extra skeletal metastatic disease identified in the neck, chest, abdomen or pelvis.    10/13/2016 Progression    PET shows various new and enlarging osseous metastatic lesions with no definite extra osseous metastatic disease currently identified.     12/31/2016 Progression    1. Multifocal hypermetabolic osseous metastases throughout the axial and proximal appendicular skeleton, which are increased in size, number and metabolism since 10/13/2016 PET-CT. 2. 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 3. No additional sites of hypermetabolic metastatic disease. 4. Chronic right mastoid sinusitis. 5. Aortic atherosclerosis.  One vessel coronary atherosclerosis.     Multiple myeloma ([place])   02/12/2017 Bone Marrow Biopsy    The marrow was variably cellular with large peritrabecular aggregates of kappa restricted plasma cells (66% by aspirate, 30% by Cd138). Cytogenetics +11.     03/01/2017 - 06/29/2017 Chemotherapy    RVD     05/26/2017 Bone Marrow Biopsy    Performed at [facility]:  Plasma cell myeloma in a 30% cellularmarrow with decreased trilineage hematopoiesis and 42% kappalight chain restricted plasma cells on the aspirate smears andlarge aggregates on the core biopsy.     07/12/2017 - 09/01/2017 Chemotherapy    3 cycles of carfizolmib/cyclophosphamide/dexamethasone      10/08/2017 Bone Marrow Transplant    Autotransplant at [facility]      CANCER STAGING: Cancer Staging Multiple myeloma [facility]) Staging form: Plasma Cell Myeloma and Plasma Cell Disorders, AJCC 8th Edition - Clinical stage from 05/03/2017: Beta-2-microglobulin (mg/L): 3.5, Albumin (g/dL): 3.4, ISS: Stage II, High-risk cytogenetics: Absent, LDH: Not assessed - Signed by Baird Cancer, PA-C on 05/03/2017    INTERVAL HISTORY:  Johnny Lucas 73 y.o. male returns for routine follow-up for multiple myeloma. He is here today with his wife. He feels fatigued and unable to  to daily activities due to him being tired. Denies any nausea, vomiting, or diarrhea. Denies any new pains. Had not noticed any recent bleeding such as epistaxis, hematuria or hematochezia. Denies recent chest pain on exertion, shortness of breath on minimal exertion, pre-syncopal episodes, or palpitations. Denies any numbness or tingling in hands or feet. Denies any recent fevers, infections, or recent hospitalizations. Patient reports  appetite at 100% and energy level at 50%. He is eating well and maintaining his weight well.    REVIEW OF SYSTEMS:  Review of Systems  Constitutional: Positive for fatigue.  Neurological: Positive for numbness.  All other systems reviewed and are negative.    PAST MEDICAL/SURGICAL HISTORY:  Past Medical History:  Diagnosis Date  . Anxiety   . Bone metastases ([place]) 09/10/2016  . Breast cancer ([place]) 2011   Stave IV breast cancer; radiation and tamoxifen  . Breast cancer, male ([place])    Stave IV breast cancer; radiation and tamoxifen  Overview:  Left breast ca with mets bone  Overview:  METS TO BONE  . GERD (gastroesophageal reflux disease)   . Hypertension   . Macular degeneration   . Multiple myeloma ([place]) 02/18/2017  . Peripheral neuropathy    Past Surgical History:  Procedure Laterality Date  . BACK SURGERY    . CATARACT EXTRACTION W/PHACO Left 02/03/2019   Procedure: CATARACT EXTRACTION PHACO AND INTRAOCULAR LENS PLACEMENT ([place]);  Surgeon: Baruch Goldmann, MD;  Location: [facility];  Service: Ophthalmology;  Laterality: Left;  CDE: 15.89  . HERNIA REPAIR    . PORTACATH PLACEMENT Left 06/10/2018   Procedure: INSERTION PORT-A-CATH;  Surgeon: Virl Cagey, MD;  Location: [facility];  Service: General;  Laterality: Left;  . right arm surgery       SOCIAL HISTORY:  Social History   Socioeconomic History  . Marital status: Married    Spouse name: Not on file  . Number of children: 3  . Years of education: Not on file  . Highest education level: Not on file  Occupational History  . Not on file  Social Needs  . Financial resource strain: Not on file  . Food insecurity:    Worry: Not on file    Inability: Not on file  . Transportation needs:    Medical: Not on file    Non-medical: Not on file  Tobacco Use  . Smoking status: Never Smoker  . Smokeless tobacco: Former Network engineer and Sexual Activity  . Alcohol use: Yes    Alcohol/week: 24.0 standard drinks    Types: 24  Cans of beer per week  . Drug use: No  . Sexual activity: Not on file  Lifestyle  . Physical activity:    Days per week: Not on file    Minutes per session: Not on file  . Stress: Not on file  Relationships  . Social connections:    Talks on phone: Not on file    Gets together: Not on file    Attends religious service: Not on file    Active member of club or organization: Not on file    Attends meetings of clubs or organizations: Not on file    Relationship status: Not on file  . Intimate partner violence:    Fear of current or ex partner: Not on file    Emotionally abused: Not on file    Physically abused: Not on file    Forced sexual activity: Not on file  Other Topics Concern  . Not on  file  Social History Narrative  . Not on file    FAMILY HISTORY:  Family History  Problem Relation Age of Onset  . Stroke Mother   . Cancer Maternal Aunt        cancer NOS; died in her 56s  . Lung cancer Maternal Uncle        smoker    CURRENT MEDICATIONS:  Outpatient Encounter Medications as of 02/10/2019  Medication Sig Note  . acyclovir (ZOVIRAX) 800 MG tablet Take 1 tablet (800 mg total) by mouth 2 (two) times daily.   Marland Kitchen ALPRAZolam (XANAX) 0.5 MG tablet Take 1 tablet (0.5 mg total) by mouth at bedtime as needed for anxiety or sleep. TAKE 1 TABLET BY MOUTH AT BEDTIME AS NEEDED FOR ANXIETY   . amLODipine (NORVASC) 5 MG tablet Take 1 tablet (5 mg total) by mouth daily.   Marland Kitchen aspirin EC 81 MG tablet Take 81 mg by mouth daily.   . calcium-vitamin D (OSCAL WITH D) 500-200 MG-UNIT TABS tablet Take 2 tablets by mouth daily. (Patient taking differently: Take 1 tablet by mouth 2 (two) times daily. )   . denosumab (XGEVA) 120 MG/1.7ML SOLN injection Inject 120 mg into the skin once. 01/18/2019: [place]   . folic acid (FOLVITE) 1 MG tablet Take 1 mg by mouth daily.    Marland Kitchen gabapentin (NEURONTIN) 300 MG capsule Take 1 capsule (300 mg total) by mouth 2 (two) times daily. Take two times a day,  then increase to three times a day in a few weeks. (Patient taking differently: Take 300 mg by mouth 2 (two) times daily. )   . hydrALAZINE (APRESOLINE) 50 MG tablet Take 1 tablet (50 mg total) by mouth every 8 (eight) hours. (Patient taking differently: Take 50 mg by mouth 2 (two) times daily. )   . loratadine (CLARITIN) 10 MG tablet Take 1 tablet (10 mg total) by mouth daily.   . metoprolol succinate (TOPROL-XL) 100 MG 24 hr tablet Take 100 mg by mouth daily.    . Multiple Vitamin (MULTIVITAMIN WITH MINERALS) TABS tablet Take 1 tablet by mouth daily.   . pantoprazole (PROTONIX) 40 MG tablet Take 40 mg by mouth every other day.    . tamoxifen (NOLVADEX) 20 MG tablet Take 1 tablet (20 mg total) by mouth daily.   . vitamin B-12 (CYANOCOBALAMIN) 1000 MCG tablet Take 1,000 mcg by mouth daily.   . Cholecalciferol (VITAMIN D) 50 MCG (2000 UT) CAPS Take 4,000 Units by mouth 2 (two) times daily.   . ondansetron (ZOFRAN) 8 MG tablet Take 8 mg by mouth every 8 (eight) hours as needed for nausea or vomiting.   . polyethylene glycol (MIRALAX / GLYCOLAX) packet Take 17 g by mouth daily as needed for mild constipation. (Patient not taking: Reported on 02/10/2019)   . pseudoephedrine-guaifenesin (MUCINEX D) 60-600 MG 12 hr tablet Take 1 tablet by mouth 2 (two) times daily as needed for congestion.     Facility-Administered Encounter Medications as of 02/10/2019  Medication  . heparin lock flush 100 unit/mL  . sodium chloride flush (NS) 0.9 % injection 10 mL  . sodium chloride flush (NS) 0.9 % injection 10 mL    ALLERGIES:  Allergies  Allergen Reactions  . Cefepime     Suspected severe thrombocytopenia is a result of cefepime induced antigen platelet destruction     PHYSICAL EXAM:  ECOG Performance status: 1  Vitals:   02/10/19 0900  BP: (!) 122/58  Pulse: 80  Resp:  16  Temp: 98.2 F (36.8 C)  SpO2: 100%   Filed Weights   02/10/19 0900  Weight: 208 lb (94.3 kg)    Physical  Exam Constitutional:      Appearance: Normal appearance. He is normal weight.  Cardiovascular:     Rate and Rhythm: Normal rate and regular rhythm.     Heart sounds: Normal heart sounds.  Pulmonary:     Effort: Pulmonary effort is normal.     Breath sounds: Normal breath sounds.  Musculoskeletal: Normal range of motion.  Skin:    General: Skin is warm and dry.  Neurological:     Mental Status: He is alert and oriented to person, place, and time. Mental status is at baseline.  Psychiatric:        Mood and Affect: Mood normal.        Behavior: Behavior normal.        Thought Content: Thought content normal.        Judgment: Judgment normal.      LABORATORY DATA:  I have reviewed the labs as listed.  CBC    Component Value Date/Time   WBC 5.5 02/10/2019 0848   RBC 2.57 (L) 02/10/2019 0848   HGB 8.8 (L) 02/10/2019 0848   HCT 27.3 (L) 02/10/2019 0848   PLT 175 02/10/2019 0848   MCV 106.2 (H) 02/10/2019 0848   MCH 34.2 (H) 02/10/2019 0848   MCHC 32.2 02/10/2019 0848   RDW 16.5 (H) 02/10/2019 0848   LYMPHSABS 1.0 02/10/2019 0848   MONOABS 0.7 02/10/2019 0848   EOSABS 0.4 02/10/2019 0848   BASOSABS 0.0 02/10/2019 0848   CMP Latest Ref Rng & Units 02/10/2019 01/19/2019 01/05/2019  Glucose 70 - 99 mg/dL 97 111(H) 89  BUN 8 - 23 mg/dL 28(H) 19 21  Creatinine 0.61 - 1.24 mg/dL 1.79(H) 1.82(H) 1.64(H)  Sodium 135 - 145 mmol/L 136 138 140  Potassium 3.5 - 5.1 mmol/L 4.1 4.0 4.5  Chloride 98 - 111 mmol/L 105 108 107  CO2 22 - 32 mmol/L 24 23 26   Calcium 8.9 - 10.3 mg/dL 9.1 8.6(L) 9.1  Total Protein 6.5 - 8.1 g/dL 6.2(L) 6.2(L) 7.2  Total Bilirubin 0.3 - 1.2 mg/dL 0.2(L) 0.6 0.4  Alkaline Phos 38 - 126 U/L 24(L) 25(L) 54  AST 15 - 41 U/L 14(L) 14(L) 28  ALT 0 - 44 U/L 14 13 35       DIAGNOSTIC IMAGING:  I have independently reviewed the scans and discussed with the patient.   I have reviewed Francene Finders, NP's note and agree with the documentation.  I personally  performed a face-to-face visit, made revisions and my assessment and plan is as follows.    ASSESSMENT & PLAN:   Multiple myeloma ([place]) 1.  Stage II IgG kappa multiple myeloma (Dx February 2018): -RVD from 03/01/2017 through 06/29/2017 -Chemotherapy changed to 3 cycles of CCyD from 07/12/2017-04/2017 to obtain deeper response - Status post auto stem cell transplant on 10/08/2017 -Stable M spike after transplant, started on CCyD, cycle 1 on 02/14/2018, cycle 2 on 03/14/2018 -His M spike after 2 cycles was undetectable, when measured at [facility] last week.  He was recommended to have maintenance with carfilzomib 70 mg/m square on days 1 and 15 every 28 days by Dr. Norma Fredrickson. -He received his first carfilzomib 54m/m2 on 04/20/2018, developed fever with chills 3 hours later with weakness in the extremities.  He was admitted to the hospital, received 1 unit of blood transfusion.  Blood  cultures turned out to be negative.  He was febrile after hospitalization and was sent home. - Subsequently he tolerated carfilzomib and smaller doses very well.  We have increased his doses to 60 mg/m on 09/01/2018 and he tolerated 4 doses very well with the addition of dexamethasone. - We have increased his carfilzomib to 70 mg/m on 10/27/2018.  He tolerated it well for first 2 treatments. - Last treatment of high-dose carfilzomib was on 11/28/2018. -He was hospitalized on 11/29/2018 through 12/05/2018 with bronchitis, positive for parainfluenza, acute renal failure and severe thrombocytopenia.  His platelet count went down to 9.  We have given IVIG 1 g/kg x 2 doses.  Platelet count has slowly improved. - I have reviewed literature.  Carfilzomib can cause dose-related thrombotic microangiopathy.  Peripheral blood smear when he was hospitalized did not show significant schistocytes. -I had a discussion with Dr. Norma Fredrickson about these findings.  We have agreed to start him on lower dose of carfilzomib.  He has tolerated lower  doses in the past very well. - Carfilzomib was restarted at a dose of 35 mg per metered square on 01/05/2019.  He tolerated it well.  - Last dose was on 01/19/2019.  He did not have any fevers or infections after the last dose. -We discussed the myeloma panel from 01/19/2019 which shows M spike of 0.1 g.  This has been stable.  Free light chain ratio was 2.46.  Kappa light chains were 14. -We will continue carfilzomib at the same dose.  I will reevaluate him in 4 weeks to see how he is responding to Procrit.  2.  Metastatic left breast cancer to the bones (Dx 2011): -he is status post lumpectomy and was started on tamoxifen. -he will continue tamoxifen.  3.  Peripheral neuropathy: His neuropathy has been stable.  He is continuing gabapentin 300 mg twice daily.  4.  Infection prophylaxis: -We will continue acyclovir 400 mg twice daily.   5.  Bone strengthening: -He will continue denosumab monthly.  6.  Macrocytic anemia: - Hemoglobin is 8.8.  MCV was elevated at 106. -I have done work-up for microcytic anemia.  O96 and folic acid were normal.  Ferritin and percent saturation were also adequate. -We talked about initiating him on Procrit injections at this time.  Talked about side effects in detail.       Orders placed this encounter:  Orders Placed This Encounter  Procedures  . [place], [place] [phone]

## 2019-02-10 NOTE — Patient Instructions (Signed)
Epoetin Alfa injection °What is this medicine? °EPOETIN ALFA (e POE e tin AL fa) helps your body make more red blood cells. This medicine is used to treat anemia caused by chronic kidney disease, cancer chemotherapy, or HIV-therapy. It may also be used before surgery if you have anemia. °This medicine may be used for other purposes; ask your health care provider or pharmacist if you have questions. °COMMON BRAND NAME(S): Epogen, Procrit, Retacrit °What should I tell my health care provider before I take this medicine? °They need to know if you have any of these conditions: °-cancer °-heart disease °-high blood pressure °-history of blood clots °-history of stroke °-low levels of folate, iron, or vitamin B12 in the blood °-seizures °-an unusual or allergic reaction to erythropoietin, albumin, benzyl alcohol, hamster proteins, other medicines, foods, dyes, or preservatives °-pregnant or trying to get pregnant °-breast-feeding °How should I use this medicine? °This medicine is for injection into a vein or under the skin. It is usually given by a health care professional in a hospital or clinic setting. °If you get this medicine at home, you will be taught how to prepare and give this medicine. Use exactly as directed. Take your medicine at regular intervals. Do not take your medicine more often than directed. °It is important that you put your used needles and syringes in a special sharps container. Do not put them in a trash can. If you do not have a sharps container, call your pharmacist or healthcare provider to get one. °A special MedGuide will be given to you by the pharmacist with each prescription and refill. Be sure to read this information carefully each time. °Talk to your pediatrician regarding the use of this medicine in children. While this drug may be prescribed for selected conditions, precautions do apply. °Overdosage: If you think you have taken too much of this medicine contact a poison control center  or emergency room at once. °NOTE: This medicine is only for you. Do not share this medicine with others. °What if I miss a dose? °If you miss a dose, take it as soon as you can. If it is almost time for your next dose, take only that dose. Do not take double or extra doses. °What may interact with this medicine? °Interactions have not been studied. °This list may not describe all possible interactions. Give your health care provider a list of all the medicines, herbs, non-prescription drugs, or dietary supplements you use. Also tell them if you smoke, drink alcohol, or use illegal drugs. Some items may interact with your medicine. °What should I watch for while using this medicine? °Your condition will be monitored carefully while you are receiving this medicine. °You may need blood work done while you are taking this medicine. °This medicine may cause a decrease in vitamin B6. You should make sure that you get enough vitamin B6 while you are taking this medicine. Discuss the foods you eat and the vitamins you take with your health care professional. °What side effects may I notice from receiving this medicine? °Side effects that you should report to your doctor or health care professional as soon as possible: °-allergic reactions like skin rash, itching or hives, swelling of the face, lips, or tongue °-seizures °-signs and symptoms of a blood clot such as breathing problems; changes in vision; chest pain; severe, sudden headache; pain, swelling, warmth in the leg; trouble speaking; sudden numbness or weakness of the face, arm or leg °-signs and symptoms of a stroke   like changes in vision; confusion; trouble speaking or understanding; severe headaches; sudden numbness or weakness of the face, arm or leg; trouble walking; dizziness; loss of balance or coordination °Side effects that usually do not require medical attention (report to your doctor or health care professional if they continue or are  bothersome): °-chills °-cough °-dizziness °-fever °-headaches °-joint pain °-muscle cramps °-muscle pain °-nausea, vomiting °-pain, redness, or irritation at site where injected °This list may not describe all possible side effects. Call your doctor for medical advice about side effects. You may report side effects to FDA at 1-800-FDA-1088. °Where should I keep my medicine? °Keep out of the reach of children. °Store in a refrigerator between 2 and 8 degrees C (36 and 46 degrees F). Do not freeze or shake. Throw away any unused portion if using a single-dose vial. Multi-dose vials can be kept in the refrigerator for up to 21 days after the initial dose. Throw away unused medicine. °NOTE: This sheet is a summary. It may not cover all possible information. If you have questions about this medicine, talk to your doctor, pharmacist, or health care provider. °© 2019 Elsevier/Gold Standard (2017-07-23 08:35:19) ° °

## 2019-02-10 NOTE — Progress Notes (Signed)
Patient seen by Dr. Delton Coombes with lab review and ok to treat today verbal order.  Epogen added to plan today and checked for prior authorization.  Information given for review for new medication and all questions asked and answered.    Patient tolerated chemotherapy with no complaints voiced.  Injection for Epogen administered with no complaints of pain  Port site clean and dry with no bruising or swelling noted at site.  Good blood return noted before and after administration of chemotherapy.  Band aids applied.  Patient left ambulatory with VSS and no s/s of distress noted.

## 2019-02-10 NOTE — Patient Instructions (Signed)
[facility] at [facility] Discharge Instructions     Thank you for choosing [facility] at [facility] to provide your oncology and hematology care.  To afford each patient quality time with our provider, please arrive at least 15 minutes before your scheduled appointment time.   If you have a lab appointment with the Cancer Center please come in thru the  Main Entrance and check in at the main information desk  You need to re-schedule your appointment should you arrive 10 or more minutes late.  We strive to give you quality time with our providers, and arriving late affects you and other patients whose appointments are after yours.  Also, if you no show three or more times for appointments you may be dismissed from the clinic at the providers discretion.     Again, thank you for choosing [facility].  Our hope is that these requests will decrease the amount of time that you wait before being seen by our physicians.       _____________________________________________________________  Should you have questions after your visit to [facility], please contact our office at [phone] between the hours of 8:00 a.m. and 4:30 p.m.  Voicemails left after 4:00 p.m. will not be returned until the following business day.  For prescription refill requests, have your pharmacy contact our office and allow 72 hours.    Cancer Center Support Programs:   > Cancer Support Group  2nd Tuesday of the month 1pm-2pm, Journey Room    

## 2019-02-13 ENCOUNTER — Inpatient Hospital Stay (HOSPITAL_COMMUNITY): Payer: Medicare Other

## 2019-02-13 ENCOUNTER — Encounter (HOSPITAL_COMMUNITY): Payer: Self-pay

## 2019-02-13 VITALS — BP 133/71 | HR 84 | Temp 98.4°F | Resp 18

## 2019-02-13 DIAGNOSIS — C7951 Secondary malignant neoplasm of bone: Secondary | ICD-10-CM | POA: Diagnosis not present

## 2019-02-13 DIAGNOSIS — C9000 Multiple myeloma not having achieved remission: Secondary | ICD-10-CM

## 2019-02-13 DIAGNOSIS — Z5111 Encounter for antineoplastic chemotherapy: Secondary | ICD-10-CM | POA: Diagnosis not present

## 2019-02-13 DIAGNOSIS — C50822 Malignant neoplasm of overlapping sites of left male breast: Secondary | ICD-10-CM | POA: Diagnosis not present

## 2019-02-13 DIAGNOSIS — Z79810 Long term (current) use of selective estrogen receptor modulators (SERMs): Secondary | ICD-10-CM | POA: Diagnosis not present

## 2019-02-13 DIAGNOSIS — Z170 Estrogen receptor positive status [ER+]: Secondary | ICD-10-CM | POA: Diagnosis not present

## 2019-02-13 MED ORDER — DENOSUMAB 120 MG/1.7ML ~~LOC~~ SOLN
120.0000 mg | Freq: Once | SUBCUTANEOUS | Status: AC
  Administered 2019-02-13: 120 mg via SUBCUTANEOUS
  Filled 2019-02-13: qty 1.7

## 2019-02-13 NOTE — Progress Notes (Signed)
Johnny Lucas presents today for injection per MD orders. Xgeva 120 mg  administered SQ in left Abdomen. Administration without incident. Patient tolerated well.   Vitals stable and discharged home from clinic ambulatory. Follow up as scheduled.

## 2019-02-13 NOTE — Patient Instructions (Signed)
[facility]  Discharge Instructions:   _______________________________________________________________  Thank you for choosing [facility] to provide your oncology and hematology care.  To afford each patient quality time with our providers, please arrive at least 15 minutes before your scheduled appointment.  You need to re-schedule your appointment if you arrive 10 or more minutes late.  We strive to give you quality time with our providers, and arriving late affects you and other patients whose appointments are after yours.  Also, if you no show three or more times for appointments you may be dismissed from the clinic.  Again, thank you for choosing [facility] at  [facility]. Our hope is that these requests will allow you access to exceptional care and in a timely manner. _______________________________________________________________  If you have questions after your visit, please contact our office at [phone] between the hours of 8:30 a.m. and 5:00 p.m. Voicemails left after 4:30 p.m. will not be returned until the following business day. _______________________________________________________________  For prescription refill requests, have your pharmacy contact our office. _______________________________________________________________  Recommendations made by the consultant and any test results will be sent to your referring physician. _______________________________________________________________ 

## 2019-02-16 ENCOUNTER — Other Ambulatory Visit (HOSPITAL_COMMUNITY): Payer: Medicare Other

## 2019-02-16 ENCOUNTER — Ambulatory Visit (HOSPITAL_COMMUNITY): Payer: Medicare Other

## 2019-02-17 ENCOUNTER — Inpatient Hospital Stay (HOSPITAL_COMMUNITY): Payer: Medicare Other

## 2019-02-17 ENCOUNTER — Encounter (HOSPITAL_COMMUNITY): Payer: Self-pay

## 2019-02-17 VITALS — BP 127/59 | HR 76 | Temp 97.5°F | Resp 18 | Wt 209.6 lb

## 2019-02-17 DIAGNOSIS — Z170 Estrogen receptor positive status [ER+]: Secondary | ICD-10-CM | POA: Diagnosis not present

## 2019-02-17 DIAGNOSIS — C9000 Multiple myeloma not having achieved remission: Secondary | ICD-10-CM

## 2019-02-17 DIAGNOSIS — C7951 Secondary malignant neoplasm of bone: Secondary | ICD-10-CM | POA: Diagnosis not present

## 2019-02-17 DIAGNOSIS — Z79810 Long term (current) use of selective estrogen receptor modulators (SERMs): Secondary | ICD-10-CM | POA: Diagnosis not present

## 2019-02-17 DIAGNOSIS — Z5111 Encounter for antineoplastic chemotherapy: Secondary | ICD-10-CM | POA: Diagnosis not present

## 2019-02-17 DIAGNOSIS — C50822 Malignant neoplasm of overlapping sites of left male breast: Secondary | ICD-10-CM | POA: Diagnosis not present

## 2019-02-17 LAB — COMPREHENSIVE METABOLIC PANEL
ALBUMIN: 3.8 g/dL (ref 3.5–5.0)
ALT: 14 U/L (ref 0–44)
AST: 20 U/L (ref 15–41)
Alkaline Phosphatase: 27 U/L — ABNORMAL LOW (ref 38–126)
Anion gap: 9 (ref 5–15)
BILIRUBIN TOTAL: 0.4 mg/dL (ref 0.3–1.2)
BUN: 19 mg/dL (ref 8–23)
CO2: 19 mmol/L — ABNORMAL LOW (ref 22–32)
Calcium: 8.6 mg/dL — ABNORMAL LOW (ref 8.9–10.3)
Chloride: 112 mmol/L — ABNORMAL HIGH (ref 98–111)
Creatinine, Ser: 1.75 mg/dL — ABNORMAL HIGH (ref 0.61–1.24)
GFR calc Af Amer: 45 mL/min — ABNORMAL LOW (ref >60)
GFR calc non Af Amer: 39 mL/min — ABNORMAL LOW (ref >60)
Glucose, Bld: 116 mg/dL — ABNORMAL HIGH (ref 70–99)
Potassium: 3.8 mmol/L (ref 3.5–5.1)
SODIUM: 140 mmol/L (ref 135–145)
Total Protein: 6.1 g/dL — ABNORMAL LOW (ref 6.5–8.1)

## 2019-02-17 LAB — CBC WITH DIFFERENTIAL/PLATELET
Abs Immature Granulocytes: 0.02 10*3/uL (ref 0.00–0.07)
Basophils Absolute: 0 10*3/uL (ref 0.0–0.1)
Basophils Relative: 1 %
Eosinophils Absolute: 0.3 10*3/uL (ref 0.0–0.5)
Eosinophils Relative: 6 %
HEMATOCRIT: 28.7 % — AB (ref 39.0–52.0)
HEMOGLOBIN: 9.1 g/dL — AB (ref 13.0–17.0)
Immature Granulocytes: 0 %
LYMPHS PCT: 15 %
Lymphs Abs: 0.8 10*3/uL (ref 0.7–4.0)
MCH: 33.8 pg (ref 26.0–34.0)
MCHC: 31.7 g/dL (ref 30.0–36.0)
MCV: 106.7 fL — ABNORMAL HIGH (ref 80.0–100.0)
Monocytes Absolute: 0.5 10*3/uL (ref 0.1–1.0)
Monocytes Relative: 10 %
NEUTROS ABS: 3.7 10*3/uL (ref 1.7–7.7)
Neutrophils Relative %: 68 %
Platelets: 180 10*3/uL (ref 150–400)
RBC: 2.69 MIL/uL — ABNORMAL LOW (ref 4.22–5.81)
RDW: 17.2 % — ABNORMAL HIGH (ref 11.5–15.5)
WBC: 5.5 10*3/uL (ref 4.0–10.5)
nRBC: 0 % (ref 0.0–0.2)

## 2019-02-17 MED ORDER — EPOETIN ALFA 20000 UNIT/ML IJ SOLN
20000.0000 [IU] | Freq: Once | INTRAMUSCULAR | Status: AC
  Administered 2019-02-17: 20000 [IU] via SUBCUTANEOUS
  Filled 2019-02-17: qty 1

## 2019-02-17 NOTE — Progress Notes (Signed)
Pt presents today for Procrit injection. Labs drawn prior to arriving. VSS. MAR reviewed with patient.   Johnny Lucas presents today for injection per MD orders. Procrit 20,000 administered SQ in left Abdomen. Administration without incident. Patient tolerated well.   No complaints at this time. Discharged from clinic ambulatory. F/U with [facility] as scheduled.

## 2019-02-17 NOTE — Patient Instructions (Signed)
[facility] at [facility]  Discharge Instructions:   _______________________________________________________________  Thank you for choosing  [facility] at [facility] to provide your oncology and hematology care.  To afford each patient quality time with our providers, please arrive at least 15 minutes before your scheduled appointment.  You need to re-schedule your appointment if you arrive 10 or more minutes late.  We strive to give you quality time with our providers, and arriving late affects you and other patients whose appointments are after yours.  Also, if you no show three or more times for appointments you may be dismissed from the clinic.  Again, thank you for choosing  [facility] Center at  Hospital. Our hope is that these requests will allow you access to exceptional care and in a timely manner. _______________________________________________________________  If you have questions after your visit, please contact our office at [phone] between the hours of 8:30 a.m. and 5:00 p.m. Voicemails left after 4:30 p.m. will not be returned until the following business day. _______________________________________________________________  For prescription refill requests, have your pharmacy contact our office. _______________________________________________________________  Recommendations made by the consultant and any test results will be sent to your referring physician. _______________________________________________________________ 

## 2019-02-24 ENCOUNTER — Inpatient Hospital Stay (HOSPITAL_COMMUNITY): Payer: Medicare Other

## 2019-02-24 ENCOUNTER — Encounter (HOSPITAL_COMMUNITY): Payer: Self-pay

## 2019-02-24 VITALS — BP 112/58 | HR 63 | Temp 97.7°F | Resp 18 | Wt 208.6 lb

## 2019-02-24 DIAGNOSIS — C50122 Malignant neoplasm of central portion of left male breast: Secondary | ICD-10-CM

## 2019-02-24 DIAGNOSIS — C50822 Malignant neoplasm of overlapping sites of left male breast: Secondary | ICD-10-CM | POA: Diagnosis not present

## 2019-02-24 DIAGNOSIS — C9000 Multiple myeloma not having achieved remission: Secondary | ICD-10-CM | POA: Diagnosis not present

## 2019-02-24 DIAGNOSIS — Z5111 Encounter for antineoplastic chemotherapy: Secondary | ICD-10-CM | POA: Diagnosis not present

## 2019-02-24 DIAGNOSIS — Z79810 Long term (current) use of selective estrogen receptor modulators (SERMs): Secondary | ICD-10-CM | POA: Diagnosis not present

## 2019-02-24 DIAGNOSIS — C7951 Secondary malignant neoplasm of bone: Secondary | ICD-10-CM

## 2019-02-24 DIAGNOSIS — Z170 Estrogen receptor positive status [ER+]: Secondary | ICD-10-CM | POA: Diagnosis not present

## 2019-02-24 LAB — CBC WITH DIFFERENTIAL/PLATELET
ABS IMMATURE GRANULOCYTES: 0.02 10*3/uL (ref 0.00–0.07)
BASOS PCT: 1 %
Basophils Absolute: 0.1 10*3/uL (ref 0.0–0.1)
Eosinophils Absolute: 0.4 10*3/uL (ref 0.0–0.5)
Eosinophils Relative: 6 %
HCT: 31.4 % — ABNORMAL LOW (ref 39.0–52.0)
Hemoglobin: 10.1 g/dL — ABNORMAL LOW (ref 13.0–17.0)
Immature Granulocytes: 0 %
Lymphocytes Relative: 14 %
Lymphs Abs: 1 10*3/uL (ref 0.7–4.0)
MCH: 34.8 pg — ABNORMAL HIGH (ref 26.0–34.0)
MCHC: 32.2 g/dL (ref 30.0–36.0)
MCV: 108.3 fL — ABNORMAL HIGH (ref 80.0–100.0)
Monocytes Absolute: 0.5 10*3/uL (ref 0.1–1.0)
Monocytes Relative: 7 %
Neutro Abs: 5.1 10*3/uL (ref 1.7–7.7)
Neutrophils Relative %: 72 %
Platelets: 232 10*3/uL (ref 150–400)
RBC: 2.9 MIL/uL — ABNORMAL LOW (ref 4.22–5.81)
RDW: 16 % — ABNORMAL HIGH (ref 11.5–15.5)
WBC: 7.1 10*3/uL (ref 4.0–10.5)
nRBC: 0 % (ref 0.0–0.2)

## 2019-02-24 LAB — COMPREHENSIVE METABOLIC PANEL
ALT: 14 U/L (ref 0–44)
ANION GAP: 9 (ref 5–15)
AST: 12 U/L — AB (ref 15–41)
Albumin: 4 g/dL (ref 3.5–5.0)
Alkaline Phosphatase: 27 U/L — ABNORMAL LOW (ref 38–126)
BUN: 21 mg/dL (ref 8–23)
CO2: 26 mmol/L (ref 22–32)
Calcium: 10 mg/dL (ref 8.9–10.3)
Chloride: 103 mmol/L (ref 98–111)
Creatinine, Ser: 1.94 mg/dL — ABNORMAL HIGH (ref 0.61–1.24)
GFR calc Af Amer: 39 mL/min — ABNORMAL LOW (ref >60)
GFR calc non Af Amer: 34 mL/min — ABNORMAL LOW (ref >60)
GLUCOSE: 117 mg/dL — AB (ref 70–99)
Potassium: 3.8 mmol/L (ref 3.5–5.1)
Sodium: 138 mmol/L (ref 135–145)
Total Bilirubin: 0.4 mg/dL (ref 0.3–1.2)
Total Protein: 6.3 g/dL — ABNORMAL LOW (ref 6.5–8.1)

## 2019-02-24 MED ORDER — EPOETIN ALFA 20000 UNIT/ML IJ SOLN
20000.0000 [IU] | Freq: Once | INTRAMUSCULAR | Status: AC
  Administered 2019-02-24: 20000 [IU] via SUBCUTANEOUS
  Filled 2019-02-24: qty 1

## 2019-02-24 MED ORDER — SODIUM CHLORIDE 0.9 % IV SOLN
20.0000 mg | Freq: Once | INTRAVENOUS | Status: AC
  Administered 2019-02-24: 20 mg via INTRAVENOUS
  Filled 2019-02-24: qty 2

## 2019-02-24 MED ORDER — DEXTROSE 5 % IV SOLN
34.0000 mg/m2 | Freq: Once | INTRAVENOUS | Status: AC
  Administered 2019-02-24: 70 mg via INTRAVENOUS
  Filled 2019-02-24: qty 5

## 2019-02-24 MED ORDER — SODIUM CHLORIDE 0.9% FLUSH
10.0000 mL | INTRAVENOUS | Status: DC | PRN
  Administered 2019-02-24: 10 mL
  Filled 2019-02-24: qty 10

## 2019-02-24 MED ORDER — ACETAMINOPHEN 325 MG PO TABS
650.0000 mg | ORAL_TABLET | Freq: Once | ORAL | Status: AC
  Administered 2019-02-24: 650 mg via ORAL
  Filled 2019-02-24: qty 2

## 2019-02-24 MED ORDER — SODIUM CHLORIDE 0.9 % IV SOLN
Freq: Once | INTRAVENOUS | Status: AC
  Administered 2019-02-24: 13:00:00 via INTRAVENOUS

## 2019-02-24 MED ORDER — HEPARIN SOD (PORK) LOCK FLUSH 100 UNIT/ML IV SOLN
500.0000 [IU] | Freq: Once | INTRAVENOUS | Status: AC | PRN
  Administered 2019-02-24: 500 [IU]

## 2019-02-24 MED ORDER — SODIUM CHLORIDE 0.9 % IV SOLN
Freq: Once | INTRAVENOUS | Status: AC
  Administered 2019-02-24: 11:00:00 via INTRAVENOUS

## 2019-02-24 NOTE — Patient Instructions (Signed)
[facility] Discharge Instructions for Patients Receiving Chemotherapy  Today you received the following chemotherapy agents  If you develop nausea and vomiting that is not controlled by your nausea medication, call the clinic.   BELOW ARE SYMPTOMS THAT SHOULD BE REPORTED IMMEDIATELY:  *FEVER GREATER THAN 100.5 F  *CHILLS WITH OR WITHOUT FEVER  NAUSEA AND VOMITING THAT IS NOT CONTROLLED WITH YOUR NAUSEA MEDICATION  *UNUSUAL SHORTNESS OF BREATH  *UNUSUAL BRUISING OR BLEEDING  TENDERNESS IN MOUTH AND THROAT WITH OR WITHOUT PRESENCE OF ULCERS  *URINARY PROBLEMS  *BOWEL PROBLEMS  UNUSUAL RASH Items with * indicate a potential emergency and should be followed up as soon as possible.  Feel free to call the clinic should you have any questions or concerns. The clinic phone number is [phone].  Please show the CHEMO ALERT CARD at check-in to the Emergency Department and triage nurse.   

## 2019-02-24 NOTE — Progress Notes (Signed)
Labs reviewed with Dr. Walden Field with Ser Creat 1.94 with verbal order ok to treat today.   Patient tolerated chemotherapy with no complaints voiced.  Port site clean and dry with no bruising or swelling noted at site.  Good blood return noted before and after administration of chemotherapy.  Band aid applied.  Patient left ambulatory with VSS and no s/s of distress noted.

## 2019-03-03 ENCOUNTER — Inpatient Hospital Stay (HOSPITAL_COMMUNITY): Payer: Medicare Other | Attending: Hematology

## 2019-03-03 ENCOUNTER — Inpatient Hospital Stay (HOSPITAL_COMMUNITY): Payer: Medicare Other

## 2019-03-03 ENCOUNTER — Encounter (HOSPITAL_COMMUNITY): Payer: Self-pay

## 2019-03-03 VITALS — BP 117/68 | HR 66 | Temp 98.1°F | Resp 18

## 2019-03-03 DIAGNOSIS — C50922 Malignant neoplasm of unspecified site of left male breast: Secondary | ICD-10-CM | POA: Insufficient documentation

## 2019-03-03 DIAGNOSIS — G629 Polyneuropathy, unspecified: Secondary | ICD-10-CM | POA: Diagnosis not present

## 2019-03-03 DIAGNOSIS — D649 Anemia, unspecified: Secondary | ICD-10-CM | POA: Diagnosis not present

## 2019-03-03 DIAGNOSIS — C9000 Multiple myeloma not having achieved remission: Secondary | ICD-10-CM | POA: Insufficient documentation

## 2019-03-03 DIAGNOSIS — Z79810 Long term (current) use of selective estrogen receptor modulators (SERMs): Secondary | ICD-10-CM | POA: Diagnosis not present

## 2019-03-03 DIAGNOSIS — N183 Chronic kidney disease, stage 3 (moderate): Secondary | ICD-10-CM | POA: Diagnosis present

## 2019-03-03 DIAGNOSIS — C50122 Malignant neoplasm of central portion of left male breast: Secondary | ICD-10-CM

## 2019-03-03 DIAGNOSIS — D631 Anemia in chronic kidney disease: Secondary | ICD-10-CM | POA: Insufficient documentation

## 2019-03-03 DIAGNOSIS — Z5111 Encounter for antineoplastic chemotherapy: Secondary | ICD-10-CM | POA: Diagnosis not present

## 2019-03-03 DIAGNOSIS — Z9484 Stem cells transplant status: Secondary | ICD-10-CM | POA: Insufficient documentation

## 2019-03-03 DIAGNOSIS — C7951 Secondary malignant neoplasm of bone: Secondary | ICD-10-CM

## 2019-03-03 LAB — CBC WITH DIFFERENTIAL/PLATELET
Abs Immature Granulocytes: 0.01 10*3/uL (ref 0.00–0.07)
Basophils Absolute: 0 10*3/uL (ref 0.0–0.1)
Basophils Relative: 1 %
Eosinophils Absolute: 0.5 10*3/uL (ref 0.0–0.5)
Eosinophils Relative: 9 %
HEMATOCRIT: 32.6 % — AB (ref 39.0–52.0)
Hemoglobin: 10.2 g/dL — ABNORMAL LOW (ref 13.0–17.0)
Immature Granulocytes: 0 %
Lymphocytes Relative: 18 %
Lymphs Abs: 1 10*3/uL (ref 0.7–4.0)
MCH: 33.8 pg (ref 26.0–34.0)
MCHC: 31.3 g/dL (ref 30.0–36.0)
MCV: 107.9 fL — ABNORMAL HIGH (ref 80.0–100.0)
Monocytes Absolute: 0.8 10*3/uL (ref 0.1–1.0)
Monocytes Relative: 14 %
Neutro Abs: 3.4 10*3/uL (ref 1.7–7.7)
Neutrophils Relative %: 58 %
Platelets: 199 10*3/uL (ref 150–400)
RBC: 3.02 MIL/uL — ABNORMAL LOW (ref 4.22–5.81)
RDW: 15.5 % (ref 11.5–15.5)
WBC: 5.7 10*3/uL (ref 4.0–10.5)
nRBC: 0 % (ref 0.0–0.2)

## 2019-03-03 LAB — COMPREHENSIVE METABOLIC PANEL
ALBUMIN: 3.9 g/dL (ref 3.5–5.0)
ALT: 14 U/L (ref 0–44)
AST: 15 U/L (ref 15–41)
Alkaline Phosphatase: 29 U/L — ABNORMAL LOW (ref 38–126)
Anion gap: 6 (ref 5–15)
BILIRUBIN TOTAL: 0.4 mg/dL (ref 0.3–1.2)
BUN: 20 mg/dL (ref 8–23)
CO2: 27 mmol/L (ref 22–32)
Calcium: 9.2 mg/dL (ref 8.9–10.3)
Chloride: 107 mmol/L (ref 98–111)
Creatinine, Ser: 1.72 mg/dL — ABNORMAL HIGH (ref 0.61–1.24)
GFR calc Af Amer: 46 mL/min — ABNORMAL LOW (ref >60)
GFR calc non Af Amer: 39 mL/min — ABNORMAL LOW (ref >60)
Glucose, Bld: 112 mg/dL — ABNORMAL HIGH (ref 70–99)
Potassium: 4.1 mmol/L (ref 3.5–5.1)
Sodium: 140 mmol/L (ref 135–145)
Total Protein: 6.1 g/dL — ABNORMAL LOW (ref 6.5–8.1)

## 2019-03-03 MED ORDER — EPOETIN ALFA 20000 UNIT/ML IJ SOLN
20000.0000 [IU] | Freq: Once | INTRAMUSCULAR | Status: AC
  Administered 2019-03-03: 20000 [IU] via SUBCUTANEOUS
  Filled 2019-03-03: qty 1

## 2019-03-03 NOTE — Patient Instructions (Signed)
[facility] at [facility]  Discharge Instructions:   _______________________________________________________________  Thank you for choosing [facility] at [facility] to provide your oncology and hematology care.  To afford each patient quality time with our providers, please arrive at least 15 minutes before your scheduled appointment.  You need to re-schedule your appointment if you arrive 10 or more minutes late.  We strive to give you quality time with our providers, and arriving late affects you and other patients whose appointments are after yours.  Also, if you no show three or more times for appointments you may be dismissed from the clinic.  Again, thank you for choosing [facility] at [facility]. Our hope is that these requests will allow you access to exceptional care and in a timely manner. _______________________________________________________________  If you have questions after your visit, please contact our office at [phone] between the hours of 8:30 a.m. and 5:00 p.m. Voicemails left after 4:30 p.m. will not be returned until the following business day. _______________________________________________________________  For prescription refill requests, have your pharmacy contact our office. _______________________________________________________________  Recommendations made by the consultant and any test results will be sent to your referring physician. _______________________________________________________________ 

## 2019-03-03 NOTE — Progress Notes (Signed)
Patient tolerated injection with no complaints voiced.  Site clean and dry with no bruising or swelling noted at site.  Band aid applied.  Vss with discharge and left ambulatory with no s/s of distress noted.  

## 2019-03-07 ENCOUNTER — Other Ambulatory Visit (HOSPITAL_COMMUNITY): Payer: Self-pay | Admitting: Hematology

## 2019-03-08 ENCOUNTER — Other Ambulatory Visit (HOSPITAL_COMMUNITY): Payer: Self-pay | Admitting: *Deleted

## 2019-03-08 MED ORDER — ONDANSETRON HCL 8 MG PO TABS: 8.0000 mg | ORAL_TABLET | Freq: Three times a day (TID) | ORAL | 2 refills | Status: DC | PRN

## 2019-03-08 MED ORDER — PROCHLORPERAZINE MALEATE 10 MG PO TABS: 10.0000 mg | ORAL_TABLET | Freq: Four times a day (QID) | ORAL | 2 refills | Status: DC | PRN

## 2019-03-10 ENCOUNTER — Other Ambulatory Visit: Payer: Self-pay

## 2019-03-10 ENCOUNTER — Encounter (HOSPITAL_COMMUNITY): Payer: Self-pay | Admitting: Hematology

## 2019-03-10 ENCOUNTER — Inpatient Hospital Stay (HOSPITAL_BASED_OUTPATIENT_CLINIC_OR_DEPARTMENT_OTHER): Payer: Medicare Other | Admitting: Hematology

## 2019-03-10 ENCOUNTER — Inpatient Hospital Stay (HOSPITAL_COMMUNITY): Payer: Medicare Other

## 2019-03-10 ENCOUNTER — Other Ambulatory Visit (HOSPITAL_COMMUNITY): Payer: Medicare Other

## 2019-03-10 ENCOUNTER — Ambulatory Visit (HOSPITAL_COMMUNITY): Payer: Medicare Other

## 2019-03-10 VITALS — BP 119/64 | HR 71 | Temp 98.2°F | Wt 212.6 lb

## 2019-03-10 VITALS — BP 119/66 | HR 68 | Temp 98.1°F

## 2019-03-10 DIAGNOSIS — G629 Polyneuropathy, unspecified: Secondary | ICD-10-CM

## 2019-03-10 DIAGNOSIS — C50922 Malignant neoplasm of unspecified site of left male breast: Secondary | ICD-10-CM

## 2019-03-10 DIAGNOSIS — C9000 Multiple myeloma not having achieved remission: Secondary | ICD-10-CM

## 2019-03-10 DIAGNOSIS — Z9484 Stem cells transplant status: Secondary | ICD-10-CM

## 2019-03-10 DIAGNOSIS — Z5111 Encounter for antineoplastic chemotherapy: Secondary | ICD-10-CM

## 2019-03-10 DIAGNOSIS — Z79810 Long term (current) use of selective estrogen receptor modulators (SERMs): Secondary | ICD-10-CM

## 2019-03-10 DIAGNOSIS — D649 Anemia, unspecified: Secondary | ICD-10-CM | POA: Diagnosis not present

## 2019-03-10 LAB — CBC WITH DIFFERENTIAL/PLATELET
Abs Immature Granulocytes: 0.02 10*3/uL (ref 0.00–0.07)
Basophils Absolute: 0.1 10*3/uL (ref 0.0–0.1)
Basophils Relative: 1 %
EOS ABS: 0.4 10*3/uL (ref 0.0–0.5)
Eosinophils Relative: 6 %
HCT: 33.9 % — ABNORMAL LOW (ref 39.0–52.0)
Hemoglobin: 11.4 g/dL — ABNORMAL LOW (ref 13.0–17.0)
Immature Granulocytes: 0 %
Lymphocytes Relative: 16 %
Lymphs Abs: 1.1 10*3/uL (ref 0.7–4.0)
MCH: 36.1 pg — ABNORMAL HIGH (ref 26.0–34.0)
MCHC: 33.6 g/dL (ref 30.0–36.0)
MCV: 107.3 fL — ABNORMAL HIGH (ref 80.0–100.0)
Monocytes Absolute: 0.6 10*3/uL (ref 0.1–1.0)
Monocytes Relative: 10 %
Neutro Abs: 4.5 10*3/uL (ref 1.7–7.7)
Neutrophils Relative %: 67 %
Platelets: 233 10*3/uL (ref 150–400)
RBC: 3.16 MIL/uL — AB (ref 4.22–5.81)
RDW: 14.5 % (ref 11.5–15.5)
WBC: 6.7 10*3/uL (ref 4.0–10.5)
nRBC: 0 % (ref 0.0–0.2)

## 2019-03-10 LAB — COMPREHENSIVE METABOLIC PANEL
ALT: 16 U/L (ref 0–44)
AST: 18 U/L (ref 15–41)
Albumin: 3.9 g/dL (ref 3.5–5.0)
Alkaline Phosphatase: 30 U/L — ABNORMAL LOW (ref 38–126)
Anion gap: 11 (ref 5–15)
BUN: 21 mg/dL (ref 8–23)
CALCIUM: 9.8 mg/dL (ref 8.9–10.3)
CO2: 24 mmol/L (ref 22–32)
Chloride: 104 mmol/L (ref 98–111)
Creatinine, Ser: 1.9 mg/dL — ABNORMAL HIGH (ref 0.61–1.24)
GFR, EST AFRICAN AMERICAN: 40 mL/min — AB (ref >60)
GFR, EST NON AFRICAN AMERICAN: 35 mL/min — AB (ref >60)
Glucose, Bld: 118 mg/dL — ABNORMAL HIGH (ref 70–99)
Potassium: 3.9 mmol/L (ref 3.5–5.1)
Sodium: 139 mmol/L (ref 135–145)
Total Bilirubin: 0.5 mg/dL (ref 0.3–1.2)
Total Protein: 5.9 g/dL — ABNORMAL LOW (ref 6.5–8.1)

## 2019-03-10 MED ORDER — ACETAMINOPHEN 325 MG PO TABS
650.0000 mg | ORAL_TABLET | Freq: Once | ORAL | Status: AC
  Administered 2019-03-10: 650 mg via ORAL
  Filled 2019-03-10: qty 2

## 2019-03-10 MED ORDER — SODIUM CHLORIDE 0.9 % IV SOLN
Freq: Once | INTRAVENOUS | Status: AC
  Administered 2019-03-10: 10:00:00 via INTRAVENOUS

## 2019-03-10 MED ORDER — SODIUM CHLORIDE 0.9 % IV SOLN
20.0000 mg | Freq: Once | INTRAVENOUS | Status: AC
  Administered 2019-03-10: 20 mg via INTRAVENOUS
  Filled 2019-03-10: qty 2

## 2019-03-10 MED ORDER — SODIUM CHLORIDE 0.9 % IV SOLN
INTRAVENOUS | Status: DC
  Administered 2019-03-10: 11:00:00 via INTRAVENOUS

## 2019-03-10 MED ORDER — SODIUM CHLORIDE 0.9% FLUSH
10.0000 mL | INTRAVENOUS | Status: DC | PRN
  Administered 2019-03-10 (×2): 10 mL
  Filled 2019-03-10 (×2): qty 10

## 2019-03-10 MED ORDER — HEPARIN SOD (PORK) LOCK FLUSH 100 UNIT/ML IV SOLN
500.0000 [IU] | Freq: Once | INTRAVENOUS | Status: AC | PRN
  Administered 2019-03-10: 500 [IU]

## 2019-03-10 MED ORDER — SODIUM CHLORIDE 0.9 % IV SOLN
Freq: Once | INTRAVENOUS | Status: AC
  Administered 2019-03-10: 12:00:00 via INTRAVENOUS

## 2019-03-10 MED ORDER — DEXTROSE 5 % IV SOLN
34.0000 mg/m2 | Freq: Once | INTRAVENOUS | Status: AC
  Administered 2019-03-10: 70 mg via INTRAVENOUS
  Filled 2019-03-10: qty 5

## 2019-03-10 NOTE — Assessment & Plan Note (Signed)
1.  Stage II IgG kappa multiple myeloma (Dx February 2018): -RVD from 03/01/2017 through 06/29/2017 -Chemotherapy changed to 3 cycles of CCyD from 07/12/2017-04/2017 to obtain deeper response - Status post auto stem cell transplant on 10/08/2017 -Stable M spike after transplant, started on CCyD, cycle 1 on 02/14/2018, cycle 2 on 03/14/2018 -His M spike after 2 cycles was undetectable, when measured at [facility] last week.  He was recommended to have maintenance with carfilzomib 70 mg/m square on days 1 and 15 every 28 days by Dr. Norma Fredrickson. -He received his first carfilzomib '70mg'$ /m2 on 04/20/2018, developed fever with chills 3 hours later with weakness in the extremities.  He was admitted to the hospital, received 1 unit of blood transfusion.  Blood cultures turned out to be negative.  He was febrile after hospitalization and was sent home. - Subsequently he tolerated carfilzomib and smaller doses very well.  We have increased his doses to 60 mg/m on 09/01/2018 and he tolerated 4 doses very well with the addition of dexamethasone. - We have increased his carfilzomib to 70 mg/m on 10/27/2018.  He tolerated it well for first 2 treatments. - Last treatment of high-dose carfilzomib was on 11/28/2018. -He was hospitalized on 11/29/2018 through 12/05/2018 with bronchitis, positive for parainfluenza, acute renal failure and severe thrombocytopenia.  His platelet count went down to 9.  We have given IVIG 1 g/kg x 2 doses.  Platelet count has slowly improved. - I have reviewed literature.  Carfilzomib can cause dose-related thrombotic microangiopathy.  Peripheral blood smear when he was hospitalized did not show significant schistocytes. -I had a discussion with Dr. Norma Fredrickson about these findings.  We have agreed to start him on lower dose of carfilzomib.  He has tolerated lower doses in the past very well. - Carfilzomib was restarted at a dose of 35 mg per metered square on 01/05/2019. -Last dose was on 02/24/2019.  He has  been tolerating it very well.   -We discussed the results of myeloma panel from 01/19/2019 which shows M spike of 0.1 g.  This has been stable.  Free light chain ratio was 2.46.  Kappa free light chains were 14.  -He will continue to receive carfilzomib every 2 weeks.  We will send myeloma panel in 2 weeks.  I will see him back in 4 weeks.  2.  Metastatic left breast cancer to the bones (diagnosis 2011): -He is status post lumpectomy and continues to be on tamoxifen.  3.  Peripheral neuropathy: His neuropathy has been stable.  He is continuing gabapentin 300 mg twice daily.  4.  ID prophylaxis: -He will continue acyclovir 400 mg twice daily.   5.  Bone strengthening: -She will continue monthly denosumab.  His calcium was within normal limits.  6.  Macrocytic anemia: - This is likely from underlying CKD.  E93, folic acid and iron panel were within normal limits. - We will start him on Procrit 20,000 units weekly on 02/10/2019.  His hemoglobin improved from 8.8-11.4. - I will cut back his Procrit to 20,000 units every 2 weeks.

## 2019-03-10 NOTE — Progress Notes (Signed)
Lab draw from port without difficulty.

## 2019-03-10 NOTE — Patient Instructions (Signed)

## 2019-03-10 NOTE — Progress Notes (Signed)
[address] [place], [place] [address]   CLINIC:  Medical Oncology/Hematology  PCP:  [name], MD [place] [address] [phone]   REASON FOR VISIT:  Follow-up for multiple myelomaS/P stem cell transplant  CURRENT THERAPY:Carfilzomib every 2 weeks    BRIEF ONCOLOGIC HISTORY:    Breast cancer, male ([place])   12/31/2009 Initial Biopsy    Biopsy of L breast     12/31/2009 Pathology Results    Invasive ductal carcinoma, ER/PR+, HER 2 negative    12/31/2009 Imaging    Ultrasound showing a 2.43 x 1.85 x 3 cm hypoechoic spiculated mass in the 12 o clock L breast retroareolar region    01/01/2010 -  Anti-estrogen oral therapy    Tamoxifen 20 mg daily    01/06/2010 Imaging    Bone scan abnormal uptake in the diaphysis of the R humerus, abnormal in the R third, fifth and sixth ribs, lesion also noted in the sternum.    02/03/2010 Surgery    Rod placement and fixation of R humerus by Dr. [name]    02/05/2010 - 02/18/2010 Radiation Therapy    30Gy in 10 fractions of 3 Gy per fraction to R pathologic fracture    03/11/2010 -  Chemotherapy    Denosumab monthly, now every 3 months. Started at [facility]     06/09/2016 Imaging    Three hypermetabolic osseous lesions in the sternum, left ilium and right ilium, as discussed above, likely represent osseous metastases. At this time, these are not recognizable on the CT images. 2. No extra skeletal metastatic disease identified in the neck, chest, abdomen or pelvis.    10/13/2016 Progression    PET shows various new and enlarging osseous metastatic lesions with no definite extra osseous metastatic disease currently identified.     12/31/2016 Progression    1. Multifocal hypermetabolic osseous metastases throughout the axial and proximal appendicular skeleton, which are increased in size, number and metabolism since 10/13/2016 PET-CT. 2. 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 3. No additional sites of hypermetabolic metastatic disease. 4. Chronic right mastoid sinusitis. 5. Aortic atherosclerosis.  One vessel coronary atherosclerosis.     Multiple myeloma ([place])   02/12/2017 Bone Marrow Biopsy    The marrow was variably cellular with large peritrabecular aggregates of kappa restricted plasma cells (66% by aspirate, 30% by Cd138). Cytogenetics +11.     03/01/2017 - 06/29/2017 Chemotherapy    RVD     05/26/2017 Bone Marrow Biopsy    Performed at [facility]:  Plasma cell myeloma in a 30% cellularmarrow with decreased trilineage hematopoiesis and 42% kappalight chain restricted plasma cells on the aspirate smears andlarge aggregates on the core biopsy.     07/12/2017 - 09/01/2017 Chemotherapy    3 cycles of carfizolmib/cyclophosphamide/dexamethasone      10/08/2017 Bone Marrow Transplant    Autotransplant at [facility]      CANCER STAGING: Cancer Staging Multiple myeloma [facility]) Staging form: Plasma Cell Myeloma and Plasma Cell Disorders, AJCC 8th Edition - Clinical stage from 05/03/2017: Beta-2-microglobulin (mg/L): 3.5, Albumin (g/dL): 3.4, ISS: Stage II, High-risk cytogenetics: Absent, LDH: Not assessed - Signed by Baird Cancer, PA-C on 05/03/2017    INTERVAL HISTORY:  Mr. See 73 y.o. male returns for routine follow-up and consideration for next cycle of chemotherapy. He is here today with his wife.  He states that he is feeling better, " almost back to normal". He states that he does experience some nausea after his treatment. Denies any  vomiting, or diarrhea. Denies any new pains. Had not noticed any recent bleeding such as epistaxis, hematuria or hematochezia. Denies recent chest pain on exertion, shortness of breath on minimal exertion, pre-syncopal episodes, or palpitations. Denies any numbness or tingling in hands  or feet. Denies any recent fevers, infections, or recent hospitalizations. Patient reports appetite at 100% and energy level at 85%.  Overall, he feels ready for next cycle of chemo today.     REVIEW OF SYSTEMS:  Review of Systems  Gastrointestinal: Positive for nausea.  Skin: Positive for itching.  All other systems reviewed and are negative.    PAST MEDICAL/SURGICAL HISTORY:  Past Medical History:  Diagnosis Date   Anxiety    Bone metastases ([place]) 09/10/2016   Breast cancer ([place]) 2011   Stave IV breast cancer; radiation and tamoxifen   Breast cancer, male ([place])    Stave IV breast cancer; radiation and tamoxifen  Overview:  Left breast ca with mets bone  Overview:  METS TO BONE   GERD (gastroesophageal reflux disease)    Hypertension    Macular degeneration    Multiple myeloma ([place]) 02/18/2017   Peripheral neuropathy    Past Surgical History:  Procedure Laterality Date   BACK SURGERY     CATARACT EXTRACTION W/PHACO Left 02/03/2019   Procedure: CATARACT EXTRACTION PHACO AND INTRAOCULAR LENS PLACEMENT ([place]);  Surgeon: Baruch Goldmann, MD;  Location: [facility];  Service: Ophthalmology;  Laterality: Left;  CDE: 15.89   HERNIA REPAIR     PORTACATH PLACEMENT Left 06/10/2018   Procedure: INSERTION PORT-A-CATH;  Surgeon: Virl Cagey, MD;  Location: [facility];  Service: General;  Laterality: Left;   right arm surgery       SOCIAL HISTORY:  Social History   Socioeconomic History   Marital status: Married    Spouse name: Not on file   Number of children: 3   Years of education: Not on file   Highest education level: Not on file  Occupational History   Not on file  Social Needs   Financial resource strain: Not on file   Food insecurity:    Worry: Not on file    Inability: Not on file   Transportation needs:    Medical: Not on file    Non-medical: Not on file  Tobacco Use   Smoking status: Never Smoker   Smokeless tobacco: Former Systems developer  Substance  and Sexual Activity   Alcohol use: Yes    Alcohol/week: 24.0 standard drinks    Types: 24 Cans of beer per week   Drug use: No   Sexual activity: Not on file  Lifestyle   Physical activity:    Days per week: Not on file    Minutes per session: Not on file   Stress: Not on file  Relationships   Social connections:    Talks on phone: Not on file    Gets together: Not on file    Attends religious service: Not on file    Active member of club or organization: Not on file    Attends meetings of clubs or organizations: Not on file    Relationship status: Not on file   Intimate partner violence:    Fear of current or ex partner: Not on file    Emotionally abused: Not on file    Physically abused: Not on  file    Forced sexual activity: Not on file  Other Topics Concern   Not on file  Social History Narrative   Not on file    FAMILY HISTORY:  Family History  Problem Relation Age of Onset   Stroke Mother    Cancer Maternal Aunt        cancer NOS; died in her 73s   Lung cancer Maternal Uncle        smoker    CURRENT MEDICATIONS:  Outpatient Encounter Medications as of 03/10/2019  Medication Sig Note   acyclovir (ZOVIRAX) 800 MG tablet Take 1 tablet (800 mg total) by mouth 2 (two) times daily.    ALPRAZolam (XANAX) 0.5 MG tablet Take 1 tablet (0.5 mg total) by mouth at bedtime as needed for anxiety or sleep. TAKE 1 TABLET BY MOUTH AT BEDTIME AS NEEDED FOR ANXIETY    amLODipine (NORVASC) 5 MG tablet Take 1 tablet (5 mg total) by mouth daily.    aspirin EC 81 MG tablet Take 81 mg by mouth daily.    calcium-vitamin D (OSCAL WITH D) 500-200 MG-UNIT TABS tablet Take 2 tablets by mouth daily. (Patient taking differently: Take 1 tablet by mouth 2 (two) times daily. )    Cholecalciferol (VITAMIN D) 50 MCG (2000 UT) CAPS Take 4,000 Units by mouth 2 (two) times daily.    denosumab (XGEVA) 120 MG/1.7ML SOLN injection Inject 120 mg into the skin once. 01/18/2019: GIVEN AT  HOSPITAL    folic acid (FOLVITE) 1 MG tablet Take 1 mg by mouth daily.     gabapentin (NEURONTIN) 300 MG capsule Take 1 capsule (300 mg total) by mouth 2 (two) times daily. Take two times a day, then increase to three times a day in a few weeks. (Patient taking differently: Take 300 mg by mouth 2 (two) times daily. )    hydrALAZINE (APRESOLINE) 50 MG tablet Take 1 tablet (50 mg total) by mouth every 8 (eight) hours. (Patient taking differently: Take 50 mg by mouth 2 (two) times daily. )    loratadine (CLARITIN) 10 MG tablet Take 1 tablet (10 mg total) by mouth daily.    metoprolol succinate (TOPROL-XL) 100 MG 24 hr tablet Take 100 mg by mouth daily.     Multiple Vitamin (MULTIVITAMIN WITH MINERALS) TABS tablet Take 1 tablet by mouth daily.    ondansetron (ZOFRAN) 8 MG tablet Take 1 tablet (8 mg total) by mouth every 8 (eight) hours as needed for nausea or vomiting.    pantoprazole (PROTONIX) 40 MG tablet Take 40 mg by mouth every other day.     polyethylene glycol (MIRALAX / GLYCOLAX) packet Take 17 g by mouth daily as needed for mild constipation.    prochlorperazine (COMPAZINE) 10 MG tablet Take 1 tablet (10 mg total) by mouth every 6 (six) hours as needed for nausea or vomiting.    pseudoephedrine-guaifenesin (MUCINEX D) 60-600 MG 12 hr tablet Take 1 tablet by mouth 2 (two) times daily as needed for congestion.     tamoxifen (NOLVADEX) 20 MG tablet Take 1 tablet (20 mg total) by mouth daily.    vitamin B-12 (CYANOCOBALAMIN) 1000 MCG tablet Take 1,000 mcg by mouth daily.    Facility-Administered Encounter Medications as of 03/10/2019  Medication   heparin lock flush 100 unit/mL   sodium chloride flush (NS) 0.9 % injection 10 mL   sodium chloride flush (NS) 0.9 % injection 10 mL    ALLERGIES:  Allergies  Allergen Reactions   Cefepime  Suspected severe thrombocytopenia is a result of cefepime induced antigen platelet destruction     PHYSICAL EXAM:  ECOG Performance  status: 1  Vitals:   03/10/19 0821  BP: 119/64  Pulse: 71  Temp: 98.2 F (36.8 C)  SpO2: 97%   Filed Weights   03/10/19 0821  Weight: 212 lb 9.6 oz (96.4 kg)    Physical Exam Constitutional:      Appearance: Normal appearance.  Cardiovascular:     Rate and Rhythm: Normal rate and regular rhythm.  Pulmonary:     Effort: Pulmonary effort is normal.     Breath sounds: Normal breath sounds.  Abdominal:     General: Bowel sounds are normal. There is no distension.     Palpations: Abdomen is soft.  Musculoskeletal:        General: No swelling.  Skin:    General: Skin is warm.  Neurological:     General: No focal deficit present.     Mental Status: He is alert and oriented to person, place, and time.  Psychiatric:        Mood and Affect: Mood normal.        Behavior: Behavior normal.      LABORATORY DATA:  I have reviewed the labs as listed.  CBC    Component Value Date/Time   WBC 6.7 03/10/2019 0815   RBC 3.16 (L) 03/10/2019 0815   HGB 11.4 (L) 03/10/2019 0815   HCT 33.9 (L) 03/10/2019 0815   PLT 233 03/10/2019 0815   MCV 107.3 (H) 03/10/2019 0815   MCH 36.1 (H) 03/10/2019 0815   MCHC 33.6 03/10/2019 0815   RDW 14.5 03/10/2019 0815   LYMPHSABS 1.1 03/10/2019 0815   MONOABS 0.6 03/10/2019 0815   EOSABS 0.4 03/10/2019 0815   BASOSABS 0.1 03/10/2019 0815   CMP Latest Ref Rng & Units 03/10/2019 03/03/2019 02/24/2019  Glucose 70 - 99 mg/dL 118(H) 112(H) 117(H)  BUN 8 - 23 mg/dL _0 Creatinine 0.61 - 1.24 mg/dL 1.90(H) 1.72(H) 1.94(H)  Sodium 135 - 145 mmol/L 139 140 138  Potassium 3.5 - 5.1 mmol/L 3.9 4.1 3.8  Chloride 98 - 111 mmol/L 104 107 103  CO2 22 - 32 mmol/L _1 Calcium 8.9 - 10.3 mg/dL 9.8 9.2 10.0  Total Protein 6.5 - 8.1 g/dL 5.9(L) 6.1(L) 6.3(L)  Total Bilirubin 0.3 - 1.2 mg/dL 0.5 0.4 0.4  Alkaline Phos 38 - 126 U/L 30(L) 29(L) 27(L)  AST 15 - 41 U/L 18 15 12(L)  ALT 0 - 44 U/L _2 DIAGNOSTIC IMAGING:  I have  independently reviewed the scans and discussed with the patient.   I have reviewed Venita Lick LPN's note and agree with the documentation.  I personally performed a face-to-face visit, made revisions and my assessment and plan is as follows.    ASSESSMENT & PLAN:   Multiple myeloma ([place]) 1.  Stage II IgG kappa multiple myeloma (Dx February 2018): -RVD from 03/01/2017 through 06/29/2017 -Chemotherapy changed to 3 cycles of CCyD from 07/12/2017-04/2017 to obtain deeper response - Status post auto stem cell transplant on 10/08/2017 -Stable M spike after transplant, started on CCyD, cycle 1 on 02/14/2018, cycle 2 on 03/14/2018 -His M spike after 2 cycles was undetectable, when measured at [facility] last week.  He was recommended to have maintenance with carfilzomib 70 mg/m square on days 1 and 15 every 28 days by Dr. Norma Fredrickson. -He received his first  carfilzomib 60m/m2 on 04/20/2018, developed fever with chills 3 hours later with weakness in the extremities.  He was admitted to the hospital, received 1 unit of blood transfusion.  Blood cultures turned out to be negative.  He was febrile after hospitalization and was sent home. - Subsequently he tolerated carfilzomib and smaller doses very well.  We have increased his doses to 60 mg/m on 09/01/2018 and he tolerated 4 doses very well with the addition of dexamethasone. - We have increased his carfilzomib to 70 mg/m on 10/27/2018.  He tolerated it well for first 2 treatments. - Last treatment of high-dose carfilzomib was on 11/28/2018. -He was hospitalized on 11/29/2018 through 12/05/2018 with bronchitis, positive for parainfluenza, acute renal failure and severe thrombocytopenia.  His platelet count went down to 9.  We have given IVIG 1 g/kg x 2 doses.  Platelet count has slowly improved. - I have reviewed literature.  Carfilzomib can cause dose-related thrombotic microangiopathy.  Peripheral blood smear when he was hospitalized did not show significant  schistocytes. -I had a discussion with Dr. RNorma Fredricksonabout these findings.  We have agreed to start him on lower dose of carfilzomib.  He has tolerated lower doses in the past very well. - Carfilzomib was restarted at a dose of 35 mg per metered square on 01/05/2019. -Last dose was on 02/24/2019.  He has been tolerating it very well.   -We discussed the results of myeloma panel from 01/19/2019 which shows M spike of 0.1 g.  This has been stable.  Free light chain ratio was 2.46.  Kappa free light chains were 14.  -He will continue to receive carfilzomib every 2 weeks.  We will send myeloma panel in 2 weeks.  I will see him back in 4 weeks.  2.  Metastatic left breast cancer to the bones (diagnosis 2011): -He is status post lumpectomy and continues to be on tamoxifen.  3.  Peripheral neuropathy: His neuropathy has been stable.  He is continuing gabapentin 300 mg twice daily.  4.  ID prophylaxis: -He will continue acyclovir 400 mg twice daily.   5.  Bone strengthening: -She will continue monthly denosumab.  His calcium was within normal limits.  6.  Macrocytic anemia: - This is likely from underlying CKD.  BP36 folic acid and iron panel were within normal limits. - We will start him on Procrit 20,000 units weekly on 02/10/2019.  His hemoglobin improved from 8.8-11.4. - I will cut back his Procrit to 20,000 units every 2 weeks.      Orders placed this encounter:  Orders Placed This Encounter  Procedures   Protein electrophoresis, serum   Kappa/lambda light chains   Lactate dehydrogenase   CBC with Differential/Platelet   Comprehensive metabolic panel      SDerek Jack MD [phone]

## 2019-03-10 NOTE — Patient Instructions (Addendum)
[place] at [facility] Discharge Instructions You were seen today by Dr. Delton Coombes. He went over your recent lab results. He will see you back in 4 weeks for labs, follow up and treatment.    Thank you for choosing [place] at [facility] to provide your oncology and hematology care.  To afford each patient quality time with our provider, please arrive at least 15 minutes before your scheduled appointment time.   If you have a lab appointment with the [facility] please come in thru the  Main Entrance and check in at the main information desk  You need to re-schedule your appointment should you arrive 10 or more minutes late.  We strive to give you quality time with our providers, and arriving late affects you and other patients whose appointments are after yours.  Also, if you no show three or more times for appointments you may be dismissed from the clinic at the providers discretion.     Again, thank you for choosing [facility].  Our hope is that these requests will decrease the amount of time that you wait before being seen by our physicians.       _____________________________________________________________  Should you have questions after your visit to [facility], please contact our office at [phone] between the hours of 8:00 a.m. and 4:30 p.m.  Voicemails left after 4:00 p.m. will not be returned until the following business day.  For prescription refill requests, have your pharmacy contact our office and allow 72 hours.    Cancer Center Support Programs:   > Cancer Support Group  2nd Tuesday of the month 1pm-2pm, Journey Room

## 2019-03-10 NOTE — Progress Notes (Signed)
Nutrition Follow-up:  Patient with stage IV breast cancer with bone mets and multiple myeloma s/p stem cell transplant.  Patient currently receiving carfilzomib.    Patient seen as add on in clinic today.  Met with patient and wife during infusion.  Patient reports that he is eating well, too much.  Reports that he continues drinking ensure as he really enjoys it.    Denies nutrition impact symptoms at this time   Medications: reviewed  Labs: reviewed  Anthropometrics:   Weight increased to 212 lb 9.6 oz today from 200 lb on 09/15/2018   NUTRITION DIAGNOSIS: Inadequate oral intake resolved   INTERVENTION:  Encouraged well balanced diet including good sources of lean protein, fruits, vegetables and whole grains.   May not need ensure with weight increase and good appetite    MONITORING, EVALUATION, GOAL: Patient will consume well-balanced diet for adequate nutrition   NEXT VISIT: as needed, patient to contact  Lena Fieldhouse B. Zenia Resides, [place], [place] Registered Dietitian [phone] (pager)

## 2019-03-10 NOTE — Progress Notes (Signed)
Labs reviewed with Dr. Delton Coombes. VO to hold Procrit today. Procrit schedule to change to Kyprolis schedule per MD. Tx planned changed. Scheduling notified by Dr. Delton Coombes. Proceed with treatment. Creatinine 1.90. MD aware.   Treatment given today per MD orders. Tolerated infusion without adverse affects. Vital signs stable. No complaints at this time. Discharged from clinic ambulatory. F/U with [facility] as scheduled.

## 2019-03-13 ENCOUNTER — Other Ambulatory Visit: Payer: Self-pay

## 2019-03-13 ENCOUNTER — Inpatient Hospital Stay (HOSPITAL_COMMUNITY): Payer: Medicare Other

## 2019-03-13 VITALS — BP 127/66 | HR 77 | Temp 98.2°F | Resp 18

## 2019-03-13 DIAGNOSIS — G629 Polyneuropathy, unspecified: Secondary | ICD-10-CM | POA: Diagnosis not present

## 2019-03-13 DIAGNOSIS — C9000 Multiple myeloma not having achieved remission: Secondary | ICD-10-CM

## 2019-03-13 DIAGNOSIS — C50922 Malignant neoplasm of unspecified site of left male breast: Secondary | ICD-10-CM | POA: Diagnosis not present

## 2019-03-13 DIAGNOSIS — Z79810 Long term (current) use of selective estrogen receptor modulators (SERMs): Secondary | ICD-10-CM | POA: Diagnosis not present

## 2019-03-13 DIAGNOSIS — C7951 Secondary malignant neoplasm of bone: Secondary | ICD-10-CM

## 2019-03-13 DIAGNOSIS — Z9484 Stem cells transplant status: Secondary | ICD-10-CM | POA: Diagnosis not present

## 2019-03-13 DIAGNOSIS — Z5111 Encounter for antineoplastic chemotherapy: Secondary | ICD-10-CM | POA: Diagnosis not present

## 2019-03-13 MED ORDER — DENOSUMAB 120 MG/1.7ML ~~LOC~~ SOLN
SUBCUTANEOUS | Status: AC
  Filled 2019-03-13: qty 1.7

## 2019-03-13 MED ORDER — DENOSUMAB 120 MG/1.7ML ~~LOC~~ SOLN
120.0000 mg | Freq: Once | SUBCUTANEOUS | Status: AC
  Administered 2019-03-13: 120 mg via SUBCUTANEOUS

## 2019-03-13 NOTE — Progress Notes (Signed)
xgeva given today per orders. . Patient tolerated it well without problems. Vitals stable and discharged home from clinic ambulatory. Follow up as scheduled.

## 2019-03-17 ENCOUNTER — Ambulatory Visit (HOSPITAL_COMMUNITY): Payer: Medicare Other

## 2019-03-17 ENCOUNTER — Other Ambulatory Visit (HOSPITAL_COMMUNITY): Payer: Medicare Other

## 2019-03-24 ENCOUNTER — Ambulatory Visit (HOSPITAL_COMMUNITY): Payer: Medicare Other

## 2019-03-24 ENCOUNTER — Inpatient Hospital Stay (HOSPITAL_COMMUNITY): Payer: Medicare Other

## 2019-03-24 ENCOUNTER — Encounter (HOSPITAL_COMMUNITY): Payer: Self-pay

## 2019-03-24 ENCOUNTER — Other Ambulatory Visit: Payer: Self-pay

## 2019-03-24 ENCOUNTER — Other Ambulatory Visit (HOSPITAL_COMMUNITY): Payer: Medicare Other

## 2019-03-24 VITALS — BP 135/59 | HR 68 | Temp 98.4°F | Resp 18 | Wt 210.8 lb

## 2019-03-24 DIAGNOSIS — C9000 Multiple myeloma not having achieved remission: Secondary | ICD-10-CM

## 2019-03-24 DIAGNOSIS — G629 Polyneuropathy, unspecified: Secondary | ICD-10-CM | POA: Diagnosis not present

## 2019-03-24 DIAGNOSIS — C50922 Malignant neoplasm of unspecified site of left male breast: Secondary | ICD-10-CM | POA: Diagnosis not present

## 2019-03-24 DIAGNOSIS — C7951 Secondary malignant neoplasm of bone: Secondary | ICD-10-CM

## 2019-03-24 DIAGNOSIS — Z9484 Stem cells transplant status: Secondary | ICD-10-CM | POA: Diagnosis not present

## 2019-03-24 DIAGNOSIS — Z79810 Long term (current) use of selective estrogen receptor modulators (SERMs): Secondary | ICD-10-CM | POA: Diagnosis not present

## 2019-03-24 DIAGNOSIS — Z5111 Encounter for antineoplastic chemotherapy: Secondary | ICD-10-CM | POA: Diagnosis not present

## 2019-03-24 LAB — CBC WITH DIFFERENTIAL/PLATELET
Abs Immature Granulocytes: 0.01 10*3/uL (ref 0.00–0.07)
BASOS ABS: 0.1 10*3/uL (ref 0.0–0.1)
Basophils Relative: 1 %
Eosinophils Absolute: 0.4 10*3/uL (ref 0.0–0.5)
Eosinophils Relative: 6 %
HCT: 32.4 % — ABNORMAL LOW (ref 39.0–52.0)
Hemoglobin: 10.7 g/dL — ABNORMAL LOW (ref 13.0–17.0)
Immature Granulocytes: 0 %
Lymphocytes Relative: 18 %
Lymphs Abs: 1.1 10*3/uL (ref 0.7–4.0)
MCH: 34.7 pg — ABNORMAL HIGH (ref 26.0–34.0)
MCHC: 33 g/dL (ref 30.0–36.0)
MCV: 105.2 fL — ABNORMAL HIGH (ref 80.0–100.0)
Monocytes Absolute: 0.6 10*3/uL (ref 0.1–1.0)
Monocytes Relative: 10 %
NEUTROS ABS: 3.8 10*3/uL (ref 1.7–7.7)
Neutrophils Relative %: 65 %
Platelets: 158 10*3/uL (ref 150–400)
RBC: 3.08 MIL/uL — ABNORMAL LOW (ref 4.22–5.81)
RDW: 13 % (ref 11.5–15.5)
WBC: 5.9 10*3/uL (ref 4.0–10.5)
nRBC: 0 % (ref 0.0–0.2)

## 2019-03-24 LAB — COMPREHENSIVE METABOLIC PANEL
ALBUMIN: 3.6 g/dL (ref 3.5–5.0)
ALT: 13 U/L (ref 0–44)
AST: 16 U/L (ref 15–41)
Alkaline Phosphatase: 26 U/L — ABNORMAL LOW (ref 38–126)
Anion gap: 8 (ref 5–15)
BUN: 18 mg/dL (ref 8–23)
CO2: 23 mmol/L (ref 22–32)
Calcium: 9.8 mg/dL (ref 8.9–10.3)
Chloride: 106 mmol/L (ref 98–111)
Creatinine, Ser: 1.69 mg/dL — ABNORMAL HIGH (ref 0.61–1.24)
GFR calc Af Amer: 47 mL/min — ABNORMAL LOW (ref >60)
GFR calc non Af Amer: 40 mL/min — ABNORMAL LOW (ref >60)
Glucose, Bld: 94 mg/dL (ref 70–99)
POTASSIUM: 4 mmol/L (ref 3.5–5.1)
SODIUM: 137 mmol/L (ref 135–145)
Total Bilirubin: 0.3 mg/dL (ref 0.3–1.2)
Total Protein: 5.6 g/dL — ABNORMAL LOW (ref 6.5–8.1)

## 2019-03-24 LAB — LACTATE DEHYDROGENASE: LDH: 113 U/L (ref 98–192)

## 2019-03-24 MED ORDER — EPOETIN ALFA 20000 UNIT/ML IJ SOLN
20000.0000 [IU] | Freq: Once | INTRAMUSCULAR | Status: AC
  Administered 2019-03-24: 20000 [IU] via SUBCUTANEOUS
  Filled 2019-03-24: qty 1

## 2019-03-24 MED ORDER — SODIUM CHLORIDE 0.9 % IV SOLN
Freq: Once | INTRAVENOUS | Status: AC
  Administered 2019-03-24: 10:00:00 via INTRAVENOUS

## 2019-03-24 MED ORDER — DEXTROSE 5 % IV SOLN
34.0000 mg/m2 | Freq: Once | INTRAVENOUS | Status: AC
  Administered 2019-03-24: 70 mg via INTRAVENOUS
  Filled 2019-03-24: qty 30

## 2019-03-24 MED ORDER — ACETAMINOPHEN 325 MG PO TABS
650.0000 mg | ORAL_TABLET | Freq: Once | ORAL | Status: AC
  Administered 2019-03-24: 650 mg via ORAL
  Filled 2019-03-24: qty 2

## 2019-03-24 MED ORDER — SODIUM CHLORIDE 0.9% FLUSH
10.0000 mL | INTRAVENOUS | Status: DC | PRN
  Administered 2019-03-24: 10 mL
  Filled 2019-03-24: qty 10

## 2019-03-24 MED ORDER — SODIUM CHLORIDE 0.9 % IV SOLN: Freq: Once | INTRAVENOUS | Status: DC

## 2019-03-24 MED ORDER — SODIUM CHLORIDE 0.9 % IV SOLN
20.0000 mg | Freq: Once | INTRAVENOUS | Status: AC
  Administered 2019-03-24: 20 mg via INTRAVENOUS
  Filled 2019-03-24: qty 20

## 2019-03-24 MED ORDER — SODIUM CHLORIDE 0.9 % IV SOLN
INTRAVENOUS | Status: DC
  Administered 2019-03-24: 10:00:00 via INTRAVENOUS

## 2019-03-24 MED ORDER — HEPARIN SOD (PORK) LOCK FLUSH 100 UNIT/ML IV SOLN
500.0000 [IU] | Freq: Once | INTRAVENOUS | Status: AC | PRN
  Administered 2019-03-24: 500 [IU]

## 2019-03-24 NOTE — Patient Instructions (Addendum)
[facility] Discharge Instructions for Patients Receiving Chemotherapy   Beginning January 23rd 2017 lab work for the [facility] will be done in the  Main lab at [facility] on 1st floor. If you have a lab appointment with the [place] [facility] please come in thru the  Main Entrance and check in at the main information desk   Today you received the following chemotherapy agents Kyprolis as well as Epogen injection. Follow-up as scheduled. Call clinic for any questions or concerns  To help prevent nausea and vomiting after your treatment, we encourage you to take your nausea medication   If you develop nausea and vomiting, or diarrhea that is not controlled by your medication, call the clinic.  The clinic phone number is [phone]. Office hours are Monday-Friday 8:30am-5:00pm.  BELOW ARE SYMPTOMS THAT SHOULD BE REPORTED IMMEDIATELY:  *FEVER GREATER THAN 101.0 F  *CHILLS WITH OR WITHOUT FEVER  NAUSEA AND VOMITING THAT IS NOT CONTROLLED WITH YOUR NAUSEA MEDICATION  *UNUSUAL SHORTNESS OF BREATH  *UNUSUAL BRUISING OR BLEEDING  TENDERNESS IN MOUTH AND THROAT WITH OR WITHOUT PRESENCE OF ULCERS  *URINARY PROBLEMS  *BOWEL PROBLEMS  UNUSUAL RASH Items with * indicate a potential emergency and should be followed up as soon as possible. If you have an emergency after office hours please contact your primary care physician or go to the nearest emergency department.  Please call the clinic during office hours if you have any questions or concerns.   You may also contact the Patient Navigator at [phone] should you have any questions or need assistance in obtaining follow up care.      Resources For Cancer Patients and their Caregivers ? American Cancer Society: Can assist with transportation, wigs, general needs, runs Look Good Feel Better.        [phone] ? Cancer Care: Provides financial assistance, online support groups, medication/co-pay  assistance.  1-800-813-HOPE 667 377 3314) ? [facility] Assists [place] cancer patients and their families through emotional , educational and financial support.  (443) 050-6585 ? [place] DSS Where to apply for food stamps, Medicaid and utility assistance. 213-073-1622 ? [employer]ATS: Transportation to medical appointments. (928) 648-2927 ? Social Security Administration: May apply for disability if have a Stage IV cancer. 754-606-5593 [phone] ? [employer], Disability and Transit Services: Assists with nutrition, care and transit needs. 805-555-1731

## 2019-03-24 NOTE — Progress Notes (Signed)
0945 CBCD and CMET results reviewed with Dr. Delton Coombes and pt approved for chemo tx today per MD                                                                                     Johnny Lucas tolerated Kyprolis infusion and Epogen injection well without complaints or incident. VSS upon discharge. Pt discharged self ambulatory in satisfactory condition

## 2019-03-24 NOTE — Patient Instructions (Signed)
[facility] at [facility] Discharge Instructions  Labs drawn peripherally today   Thank you for choosing [facility] at [facility] to provide your oncology and hematology care.  To afford each patient quality time with our provider, please arrive at least 15 minutes before your scheduled appointment time.   If you have a lab appointment with the Cancer Center please come in thru the  Main Entrance and check in at the main information desk  You need to re-schedule your appointment should you arrive 10 or more minutes late.  We strive to give you quality time with our providers, and arriving late affects you and other patients whose appointments are after yours.  Also, if you no show three or more times for appointments you may be dismissed from the clinic at the providers discretion.     Again, thank you for choosing [facility].  Our hope is that these requests will decrease the amount of time that you wait before being seen by our physicians.       _____________________________________________________________  Should you have questions after your visit to [facility], please contact our office at [phone] between the hours of 8:00 a.m. and 4:30 p.m.  Voicemails left after 4:00 p.m. will not be returned until the following business day.  For prescription refill requests, have your pharmacy contact our office and allow 72 hours.    Cancer Center Support Programs:   > Cancer Support Group  2nd Tuesday of the month 1pm-2pm, Journey Room   

## 2019-03-27 LAB — PROTEIN ELECTROPHORESIS, SERUM
A/G Ratio: 1.7 (ref 0.7–1.7)
Albumin ELP: 4.1 g/dL (ref 2.9–4.4)
Alpha-1-Globulin: 0.3 g/dL (ref 0.0–0.4)
Alpha-2-Globulin: 0.8 g/dL (ref 0.4–1.0)
BETA GLOBULIN: 0.9 g/dL (ref 0.7–1.3)
Gamma Globulin: 0.5 g/dL (ref 0.4–1.8)
Globulin, Total: 2.4 g/dL (ref 2.2–3.9)
M-Spike, %: 0.1 g/dL — ABNORMAL HIGH
Total Protein ELP: 6.5 g/dL (ref 6.0–8.5)

## 2019-03-27 LAB — KAPPA/LAMBDA LIGHT CHAINS
Kappa free light chain: 20.4 mg/L — ABNORMAL HIGH (ref 3.3–19.4)
Kappa, lambda light chain ratio: 2.4 — ABNORMAL HIGH (ref 0.26–1.65)
Lambda free light chains: 8.5 mg/L (ref 5.7–26.3)

## 2019-04-04 ENCOUNTER — Encounter (HOSPITAL_COMMUNITY): Payer: Self-pay | Admitting: Hematology

## 2019-04-04 ENCOUNTER — Other Ambulatory Visit: Payer: Self-pay

## 2019-04-05 ENCOUNTER — Inpatient Hospital Stay (HOSPITAL_COMMUNITY): Payer: Medicare Other | Attending: Hematology

## 2019-04-05 ENCOUNTER — Encounter (HOSPITAL_COMMUNITY): Payer: Self-pay

## 2019-04-05 VITALS — BP 123/72 | HR 68 | Temp 98.6°F | Resp 16

## 2019-04-05 DIAGNOSIS — I129 Hypertensive chronic kidney disease with stage 1 through stage 4 chronic kidney disease, or unspecified chronic kidney disease: Secondary | ICD-10-CM | POA: Diagnosis not present

## 2019-04-05 DIAGNOSIS — Z23 Encounter for immunization: Secondary | ICD-10-CM | POA: Insufficient documentation

## 2019-04-05 DIAGNOSIS — D649 Anemia, unspecified: Secondary | ICD-10-CM | POA: Diagnosis not present

## 2019-04-05 DIAGNOSIS — D696 Thrombocytopenia, unspecified: Secondary | ICD-10-CM | POA: Diagnosis not present

## 2019-04-05 DIAGNOSIS — Z5111 Encounter for antineoplastic chemotherapy: Secondary | ICD-10-CM | POA: Diagnosis not present

## 2019-04-05 DIAGNOSIS — Z9484 Stem cells transplant status: Secondary | ICD-10-CM | POA: Diagnosis not present

## 2019-04-05 DIAGNOSIS — Z79810 Long term (current) use of selective estrogen receptor modulators (SERMs): Secondary | ICD-10-CM | POA: Diagnosis not present

## 2019-04-05 DIAGNOSIS — C50922 Malignant neoplasm of unspecified site of left male breast: Secondary | ICD-10-CM | POA: Diagnosis not present

## 2019-04-05 DIAGNOSIS — G629 Polyneuropathy, unspecified: Secondary | ICD-10-CM | POA: Insufficient documentation

## 2019-04-05 DIAGNOSIS — N189 Chronic kidney disease, unspecified: Secondary | ICD-10-CM | POA: Insufficient documentation

## 2019-04-05 DIAGNOSIS — C9000 Multiple myeloma not having achieved remission: Secondary | ICD-10-CM | POA: Insufficient documentation

## 2019-04-05 MED ORDER — HEPATITIS B VAC RECOMBINANT 20 MCG/ML IJ SUSP
2.0000 mL | Freq: Once | INTRAMUSCULAR | Status: AC
  Administered 2019-04-05: 10:00:00 40 ug via INTRAMUSCULAR
  Filled 2019-04-05: qty 2

## 2019-04-05 MED ORDER — PNEUMOCOCCAL VAC POLYVALENT 25 MCG/0.5ML IJ INJ
0.5000 mL | INJECTION | Freq: Once | INTRAMUSCULAR | Status: AC
  Administered 2019-04-05: 10:00:00 0.5 mL via INTRAMUSCULAR
  Filled 2019-04-05: qty 0.5

## 2019-04-05 MED ORDER — DTAP-IPV VACCINE IM SUSP
0.5000 mL | Freq: Once | INTRAMUSCULAR | Status: AC
  Administered 2019-04-05: 10:00:00 0.5 mL via INTRAMUSCULAR
  Filled 2019-04-05: qty 0.5

## 2019-04-05 NOTE — Progress Notes (Signed)
Vacinations given per [facility] orders. Patient tolerated it well without problems. Vitals stable and discharged home from clinic ambulatory. Follow up as scheduled.

## 2019-04-05 NOTE — Patient Instructions (Signed)
[facility] at [facility]  Discharge Instructions:   _______________________________________________________________  Thank you for choosing [facility] at [facility] to provide your oncology and hematology care.  To afford each patient quality time with our providers, please arrive at least 15 minutes before your scheduled appointment.  You need to re-schedule your appointment if you arrive 10 or more minutes late.  We strive to give you quality time with our providers, and arriving late affects you and other patients whose appointments are after yours.  Also, if you no show three or more times for appointments you may be dismissed from the clinic.  Again, thank you for choosing [facility] at [facility]. Our hope is that these requests will allow you access to exceptional care and in a timely manner. _______________________________________________________________  If you have questions after your visit, please contact our office at [phone] between the hours of 8:30 a.m. and 5:00 p.m. Voicemails left after 4:30 p.m. will not be returned until the following business day. _______________________________________________________________  For prescription refill requests, have your pharmacy contact our office. _______________________________________________________________  Recommendations made by the consultant and any test results will be sent to your referring physician. _______________________________________________________________ 

## 2019-04-10 ENCOUNTER — Inpatient Hospital Stay (HOSPITAL_COMMUNITY): Payer: Medicare Other

## 2019-04-10 ENCOUNTER — Other Ambulatory Visit (HOSPITAL_COMMUNITY): Payer: Medicare Other

## 2019-04-10 ENCOUNTER — Encounter (HOSPITAL_COMMUNITY): Payer: Self-pay | Admitting: Hematology

## 2019-04-10 ENCOUNTER — Ambulatory Visit (HOSPITAL_COMMUNITY): Payer: Medicare Other

## 2019-04-10 ENCOUNTER — Other Ambulatory Visit: Payer: Self-pay

## 2019-04-10 ENCOUNTER — Inpatient Hospital Stay (HOSPITAL_BASED_OUTPATIENT_CLINIC_OR_DEPARTMENT_OTHER): Payer: Medicare Other | Admitting: Hematology

## 2019-04-10 VITALS — BP 128/60 | HR 63 | Temp 97.7°F | Resp 18

## 2019-04-10 VITALS — BP 120/64 | HR 68 | Temp 98.3°F | Resp 16 | Wt 212.7 lb

## 2019-04-10 DIAGNOSIS — D696 Thrombocytopenia, unspecified: Secondary | ICD-10-CM | POA: Diagnosis not present

## 2019-04-10 DIAGNOSIS — C50922 Malignant neoplasm of unspecified site of left male breast: Secondary | ICD-10-CM | POA: Diagnosis not present

## 2019-04-10 DIAGNOSIS — Z5111 Encounter for antineoplastic chemotherapy: Secondary | ICD-10-CM | POA: Diagnosis not present

## 2019-04-10 DIAGNOSIS — C9000 Multiple myeloma not having achieved remission: Secondary | ICD-10-CM

## 2019-04-10 DIAGNOSIS — Z79810 Long term (current) use of selective estrogen receptor modulators (SERMs): Secondary | ICD-10-CM | POA: Diagnosis not present

## 2019-04-10 DIAGNOSIS — Z79899 Other long term (current) drug therapy: Secondary | ICD-10-CM

## 2019-04-10 DIAGNOSIS — Z23 Encounter for immunization: Secondary | ICD-10-CM | POA: Diagnosis not present

## 2019-04-10 LAB — COMPREHENSIVE METABOLIC PANEL
ALT: 15 U/L (ref 0–44)
AST: 16 U/L (ref 15–41)
Albumin: 3.7 g/dL (ref 3.5–5.0)
Alkaline Phosphatase: 30 U/L — ABNORMAL LOW (ref 38–126)
Anion gap: 7 (ref 5–15)
BUN: 20 mg/dL (ref 8–23)
CO2: 26 mmol/L (ref 22–32)
Calcium: 9.3 mg/dL (ref 8.9–10.3)
Chloride: 105 mmol/L (ref 98–111)
Creatinine, Ser: 1.75 mg/dL — ABNORMAL HIGH (ref 0.61–1.24)
GFR calc Af Amer: 45 mL/min — ABNORMAL LOW (ref >60)
GFR calc non Af Amer: 39 mL/min — ABNORMAL LOW (ref >60)
Glucose, Bld: 100 mg/dL — ABNORMAL HIGH (ref 70–99)
Potassium: 3.8 mmol/L (ref 3.5–5.1)
Sodium: 138 mmol/L (ref 135–145)
Total Bilirubin: 0.5 mg/dL (ref 0.3–1.2)
Total Protein: 5.9 g/dL — ABNORMAL LOW (ref 6.5–8.1)

## 2019-04-10 LAB — CBC WITH DIFFERENTIAL/PLATELET
Abs Immature Granulocytes: 0.02 10*3/uL (ref 0.00–0.07)
Basophils Absolute: 0 10*3/uL (ref 0.0–0.1)
Basophils Relative: 1 %
Eosinophils Absolute: 0.5 10*3/uL (ref 0.0–0.5)
Eosinophils Relative: 9 %
HCT: 33.8 % — ABNORMAL LOW (ref 39.0–52.0)
Hemoglobin: 11.1 g/dL — ABNORMAL LOW (ref 13.0–17.0)
Immature Granulocytes: 0 %
Lymphocytes Relative: 20 %
Lymphs Abs: 1.2 10*3/uL (ref 0.7–4.0)
MCH: 34.9 pg — ABNORMAL HIGH (ref 26.0–34.0)
MCHC: 32.8 g/dL (ref 30.0–36.0)
MCV: 106.3 fL — ABNORMAL HIGH (ref 80.0–100.0)
Monocytes Absolute: 0.6 10*3/uL (ref 0.1–1.0)
Monocytes Relative: 11 %
Neutro Abs: 3.4 10*3/uL (ref 1.7–7.7)
Neutrophils Relative %: 59 %
Platelets: 198 10*3/uL (ref 150–400)
RBC: 3.18 MIL/uL — ABNORMAL LOW (ref 4.22–5.81)
RDW: 13.4 % (ref 11.5–15.5)
WBC: 5.8 10*3/uL (ref 4.0–10.5)
nRBC: 0 % (ref 0.0–0.2)

## 2019-04-10 MED ORDER — DEXTROSE 5 % IV SOLN
34.0000 mg/m2 | Freq: Once | INTRAVENOUS | Status: AC
  Administered 2019-04-10: 70 mg via INTRAVENOUS
  Filled 2019-04-10: qty 5

## 2019-04-10 MED ORDER — SODIUM CHLORIDE 0.9 % IV SOLN
Freq: Once | INTRAVENOUS | Status: AC
  Administered 2019-04-10: 10:00:00 via INTRAVENOUS

## 2019-04-10 MED ORDER — HEPARIN SOD (PORK) LOCK FLUSH 100 UNIT/ML IV SOLN
500.0000 [IU] | Freq: Once | INTRAVENOUS | Status: AC | PRN
  Administered 2019-04-10: 500 [IU]

## 2019-04-10 MED ORDER — SODIUM CHLORIDE 0.9% FLUSH
10.0000 mL | INTRAVENOUS | Status: DC | PRN
  Administered 2019-04-10: 10 mL
  Filled 2019-04-10: qty 10

## 2019-04-10 MED ORDER — SODIUM CHLORIDE 0.9 % IV SOLN
INTRAVENOUS | Status: DC
  Administered 2019-04-10: 11:00:00 via INTRAVENOUS

## 2019-04-10 MED ORDER — SODIUM CHLORIDE 0.9 % IV SOLN
20.0000 mg | Freq: Once | INTRAVENOUS | Status: AC
  Administered 2019-04-10: 20 mg via INTRAVENOUS
  Filled 2019-04-10: qty 20

## 2019-04-10 MED ORDER — ACETAMINOPHEN 325 MG PO TABS
650.0000 mg | ORAL_TABLET | Freq: Once | ORAL | Status: AC
  Administered 2019-04-10: 650 mg via ORAL
  Filled 2019-04-10: qty 2

## 2019-04-10 MED ORDER — SODIUM CHLORIDE 0.9 % IV SOLN
Freq: Once | INTRAVENOUS | Status: AC
  Administered 2019-04-10: 12:00:00 via INTRAVENOUS

## 2019-04-10 NOTE — Patient Instructions (Signed)
[facility] Discharge Instructions for Patients Receiving Chemotherapy   Beginning January 23rd 2017 lab work for the [facility] will be done in the  Main lab at [facility] on 1st floor. If you have a lab appointment with the [facility] please come in thru the  Main Entrance and check in at the main information desk   Today you received the following chemotherapy agents Kyprolis. Follow-up as scheduled. Call clinic for any questions or concerns  To help prevent nausea and vomiting after your treatment, we encourage you to take your nausea medication   If you develop nausea and vomiting, or diarrhea that is not controlled by your medication, call the clinic.  The clinic phone number is [phone]. Office hours are Monday-Friday 8:30am-5:00pm.  BELOW ARE SYMPTOMS THAT SHOULD BE REPORTED IMMEDIATELY:  *FEVER GREATER THAN 101.0 F  *CHILLS WITH OR WITHOUT FEVER  NAUSEA AND VOMITING THAT IS NOT CONTROLLED WITH YOUR NAUSEA MEDICATION  *UNUSUAL SHORTNESS OF BREATH  *UNUSUAL BRUISING OR BLEEDING  TENDERNESS IN MOUTH AND THROAT WITH OR WITHOUT PRESENCE OF ULCERS  *URINARY PROBLEMS  *BOWEL PROBLEMS  UNUSUAL RASH Items with * indicate a potential emergency and should be followed up as soon as possible. If you have an emergency after office hours please contact your primary care physician or go to the nearest emergency department.  Please call the clinic during office hours if you have any questions or concerns.   You may also contact the Patient Navigator at [phone] should you have any questions or need assistance in obtaining follow up care.      Resources For Cancer Patients and their Caregivers ? American Cancer Society: Can assist with transportation, wigs, general needs, runs Look Good Feel Better.        2343953579 ? Cancer Care: Provides financial assistance, online support groups, medication/co-pay assistance.  1-800-813-HOPE  551-270-5430) ? [facility] Assists [place] cancer patients and their families through emotional , educational and financial support.  (205)754-8514 ? [place] DSS Where to apply for food stamps, Medicaid and utility assistance. (304)539-6226 ? [employer]: Transportation to medical appointments. 703-329-7958 ? Social Security Administration: May apply for disability if have a Stage IV cancer. (917)658-2537 414-473-4133 ? [employer], Disability and Transit Services: Assists with nutrition, care and transit needs. 7633837550

## 2019-04-10 NOTE — Progress Notes (Signed)
[place], [place] [address]   CLINIC:  Medical Oncology/Hematology  PCP:  [name], MD [place] [address] [phone]   REASON FOR VISIT:  Follow-up for  multiple myelomaS/P stem cell transplant  CURRENT THERAPY:Carfilzomib every 2 weeks   BRIEF ONCOLOGIC HISTORY:    Breast cancer, male ([place])   12/31/2009 Initial Biopsy    Biopsy of L breast     12/31/2009 Pathology Results    Invasive ductal carcinoma, ER/PR+, HER 2 negative    12/31/2009 Imaging    Ultrasound showing a 2.43 x 1.85 x 3 cm hypoechoic spiculated mass in the 12 o clock L breast retroareolar region    01/01/2010 -  Anti-estrogen oral therapy    Tamoxifen 20 mg daily    01/06/2010 Imaging    Bone scan abnormal uptake in the diaphysis of the R humerus, abnormal in the R third, fifth and sixth ribs, lesion also noted in the sternum.    02/03/2010 Surgery    Rod placement and fixation of R humerus by Dr. [name]    02/05/2010 - 02/18/2010 Radiation Therapy    30Gy in 10 fractions of 3 Gy per fraction to R pathologic fracture    03/11/2010 -  Chemotherapy    Denosumab monthly, now every 3 months. Started at [facility]     06/09/2016 Imaging    Three hypermetabolic osseous lesions in the sternum, left ilium and right ilium, as discussed above, likely represent osseous metastases. At this time, these are not recognizable on the CT images. 2. No extra skeletal metastatic disease identified in the neck, chest, abdomen or pelvis.    10/13/2016 Progression    PET shows various new and enlarging osseous metastatic lesions with no definite extra osseous metastatic disease currently identified.     12/31/2016 Progression    1. Multifocal hypermetabolic osseous metastases throughout the axial and proximal appendicular skeleton, which are increased in size, number and metabolism since 10/13/2016 PET-CT. 2. 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 3. No additional sites of hypermetabolic metastatic disease. 4. Chronic right mastoid sinusitis. 5. Aortic atherosclerosis.  One vessel coronary atherosclerosis.     Multiple myeloma ([place])   02/12/2017 Bone Marrow Biopsy    The marrow was variably cellular with large peritrabecular aggregates of kappa restricted plasma cells (66% by aspirate, 30% by Cd138). Cytogenetics +11.     03/01/2017 - 06/29/2017 Chemotherapy    RVD     05/26/2017 Bone Marrow Biopsy    Performed at [facility]:  Plasma cell myeloma in a 30% cellularmarrow with decreased trilineage hematopoiesis and 42% kappalight chain restricted plasma cells on the aspirate smears andlarge aggregates on the core biopsy.     07/12/2017 - 09/01/2017 Chemotherapy    3 cycles of carfizolmib/cyclophosphamide/dexamethasone      10/08/2017 Bone Marrow Transplant    Autotransplant at [facility]      CANCER STAGING: Cancer Staging Multiple myeloma [facility]) Staging form: Plasma Cell Myeloma and Plasma Cell Disorders, AJCC 8th Edition - Clinical stage from 05/03/2017: Beta-2-microglobulin (mg/L): 3.5, Albumin (g/dL): 3.4, ISS: Stage II, High-risk cytogenetics: Absent, LDH: Not assessed - Signed by Baird Cancer, PA-C on 05/03/2017    INTERVAL HISTORY:  [name] 73 y.o. male returns for routine follow-up and consideration for next cycle of chemotherapy. He is here today alone. He states  that he has been doing good since his last visit. He states that he has been staying at home and not going out in public. Denies any nausea, vomiting, or diarrhea. Denies any new pains. Had not noticed any recent bleeding such as epistaxis, hematuria or hematochezia. Denies recent chest pain on exertion, shortness of breath on minimal exertion, pre-syncopal episodes, or palpitations. Denies any numbness or tingling in hands  or feet. Denies any recent fevers, infections, or recent hospitalizations. Patient reports appetite at 100% and energy level at 75%.    REVIEW OF SYSTEMS:  Review of Systems  Neurological: Positive for numbness.     PAST MEDICAL/SURGICAL HISTORY:  Past Medical History:  Diagnosis Date   Anxiety    Bone metastases ([place]) 09/10/2016   Breast cancer ([place]) 2011   Stave IV breast cancer; radiation and tamoxifen   Breast cancer, male ([place])    Stave IV breast cancer; radiation and tamoxifen  Overview:  Left breast ca with mets bone  Overview:  METS TO BONE   GERD (gastroesophageal reflux disease)    Hypertension    Macular degeneration    Multiple myeloma ([place]) 02/18/2017   Peripheral neuropathy    Past Surgical History:  Procedure Laterality Date   BACK SURGERY     CATARACT EXTRACTION W/PHACO Left 02/03/2019   Procedure: CATARACT EXTRACTION PHACO AND INTRAOCULAR LENS PLACEMENT ([place]);  Surgeon: Baruch Goldmann, MD;  Location: [facility];  Service: Ophthalmology;  Laterality: Left;  CDE: 15.89   HERNIA REPAIR     PORTACATH PLACEMENT Left 06/10/2018   Procedure: INSERTION PORT-A-CATH;  Surgeon: Virl Cagey, MD;  Location: [facility];  Service: General;  Laterality: Left;   right arm surgery       SOCIAL HISTORY:  Social History   Socioeconomic History   Marital status: Married    Spouse name: Not on file   Number of children: 3   Years of education: Not on file   Highest education level: Not on file  Occupational History   Not on file  Social Needs   Financial resource strain: Not on file   Food insecurity:    Worry: Not on file    Inability: Not on file   Transportation needs:    Medical: Not on file    Non-medical: Not on file  Tobacco Use   Smoking status: Never Smoker   Smokeless tobacco: Former Systems developer  Substance and Sexual Activity   Alcohol use: Yes    Alcohol/week: 24.0 standard drinks    Types: 24 Cans of beer per week   Drug use: No    Sexual activity: Not on file  Lifestyle   Physical activity:    Days per week: Not on file    Minutes per session: Not on file   Stress: Not on file  Relationships   Social connections:    Talks on phone: Not on file    Gets together: Not on file    Attends religious service: Not on file    Active member of club or organization: Not on file    Attends meetings of clubs or organizations: Not on file    Relationship status: Not on file   Intimate partner violence:    Fear of current or ex partner: Not on file    Emotionally abused: Not on file    Physically abused: Not on file    Forced sexual activity: Not on file  Other Topics Concern   Not on file  Social History  Narrative   Not on file    FAMILY HISTORY:  Family History  Problem Relation Age of Onset   Stroke Mother    Cancer Maternal Aunt        cancer NOS; died in her 105s   Lung cancer Maternal Uncle        smoker    CURRENT MEDICATIONS:  Outpatient Encounter Medications as of 04/10/2019  Medication Sig Note   acyclovir (ZOVIRAX) 800 MG tablet Take 1 tablet (800 mg total) by mouth 2 (two) times daily.    ALPRAZolam (XANAX) 0.5 MG tablet Take 1 tablet (0.5 mg total) by mouth at bedtime as needed for anxiety or sleep. TAKE 1 TABLET BY MOUTH AT BEDTIME AS NEEDED FOR ANXIETY    amLODipine (NORVASC) 5 MG tablet Take 1 tablet (5 mg total) by mouth daily.    aspirin EC 81 MG tablet Take 81 mg by mouth daily.    calcium-vitamin D (OSCAL WITH D) 500-200 MG-UNIT TABS tablet Take 2 tablets by mouth daily. (Patient taking differently: Take 1 tablet by mouth 2 (two) times daily. )    Cholecalciferol (VITAMIN D) 50 MCG (2000 UT) CAPS Take 4,000 Units by mouth 2 (two) times daily.    denosumab (XGEVA) 120 MG/1.7ML SOLN injection Inject 120 mg into the skin once. 01/18/2019: GIVEN AT HOSPITAL    folic acid (FOLVITE) 1 MG tablet Take 1 mg by mouth daily.     gabapentin (NEURONTIN) 300 MG capsule Take 1 capsule (300 mg  total) by mouth 2 (two) times daily. Take two times a day, then increase to three times a day in a few weeks. (Patient taking differently: Take 300 mg by mouth 2 (two) times daily. )    hydrALAZINE (APRESOLINE) 50 MG tablet Take 1 tablet (50 mg total) by mouth every 8 (eight) hours. (Patient taking differently: Take 50 mg by mouth 2 (two) times daily. )    loratadine (CLARITIN) 10 MG tablet Take 1 tablet (10 mg total) by mouth daily.    metoprolol succinate (TOPROL-XL) 100 MG 24 hr tablet Take 100 mg by mouth daily.     Multiple Vitamin (MULTIVITAMIN WITH MINERALS) TABS tablet Take 1 tablet by mouth daily.    ondansetron (ZOFRAN) 8 MG tablet TAKE ONE TABLET BY MOUTH EVERY 8 HOURS AS NEEDED    ondansetron (ZOFRAN) 8 MG tablet Take 1 tablet (8 mg total) by mouth every 8 (eight) hours as needed for nausea or vomiting.    pantoprazole (PROTONIX) 40 MG tablet Take 40 mg by mouth every other day.     polyethylene glycol (MIRALAX / GLYCOLAX) packet Take 17 g by mouth daily as needed for mild constipation.    prochlorperazine (COMPAZINE) 10 MG tablet TAKE ONE TABLET BY MOUTH EVERY 6 HOURS AS NEEDED    prochlorperazine (COMPAZINE) 10 MG tablet Take 1 tablet (10 mg total) by mouth every 6 (six) hours as needed for nausea or vomiting.    pseudoephedrine-guaifenesin (MUCINEX D) 60-600 MG 12 hr tablet Take 1 tablet by mouth 2 (two) times daily as needed for congestion.     tamoxifen (NOLVADEX) 20 MG tablet Take 1 tablet (20 mg total) by mouth daily.    vitamin B-12 (CYANOCOBALAMIN) 1000 MCG tablet Take 1,000 mcg by mouth daily.    Facility-Administered Encounter Medications as of 04/10/2019  Medication   heparin lock flush 100 unit/mL   sodium chloride flush (NS) 0.9 % injection 10 mL   sodium chloride flush (NS) 0.9 % injection 10  mL    ALLERGIES:  Allergies  Allergen Reactions   Cefepime     Suspected severe thrombocytopenia is a result of cefepime induced antigen platelet destruction      PHYSICAL EXAM:  ECOG Performance status: 1  Vitals:   04/10/19 0835  BP: 120/64  Pulse: 68  Resp: 16  Temp: 98.3 F (36.8 C)  SpO2: 98%   Filed Weights   04/10/19 0835  Weight: 212 lb 11.2 oz (96.5 kg)    Physical Exam Vitals signs reviewed.  Constitutional:      Appearance: Normal appearance.  Cardiovascular:     Rate and Rhythm: Normal rate and regular rhythm.     Heart sounds: Normal heart sounds.  Pulmonary:     Effort: Pulmonary effort is normal.     Breath sounds: Normal breath sounds.  Abdominal:     General: Bowel sounds are normal. There is no distension.     Palpations: Abdomen is soft.     Tenderness: There is no abdominal tenderness.  Musculoskeletal:        General: No swelling.  Neurological:     General: No focal deficit present.     Mental Status: He is alert and oriented to person, place, and time.  Psychiatric:        Mood and Affect: Mood normal.        Behavior: Behavior normal.      LABORATORY DATA:  I have reviewed the labs as listed.  CBC    Component Value Date/Time   WBC 5.8 04/10/2019 0807   RBC 3.18 (L) 04/10/2019 0807   HGB 11.1 (L) 04/10/2019 0807   HCT 33.8 (L) 04/10/2019 0807   PLT 198 04/10/2019 0807   MCV 106.3 (H) 04/10/2019 0807   MCH 34.9 (H) 04/10/2019 0807   MCHC 32.8 04/10/2019 0807   RDW 13.4 04/10/2019 0807   LYMPHSABS 1.2 04/10/2019 0807   MONOABS 0.6 04/10/2019 0807   EOSABS 0.5 04/10/2019 0807   BASOSABS 0.0 04/10/2019 0807   CMP Latest Ref Rng & Units 04/10/2019 03/24/2019 03/10/2019  Glucose 70 - 99 mg/dL 100(H) 94 118(H)  BUN 8 - 23 mg/dL 20 18 21   Creatinine 0.61 - 1.24 mg/dL 1.75(H) 1.69(H) 1.90(H)  Sodium 135 - 145 mmol/L 138 137 139  Potassium 3.5 - 5.1 mmol/L 3.8 4.0 3.9  Chloride 98 - 111 mmol/L 105 106 104  CO2 22 - 32 mmol/L 26 23 24   Calcium 8.9 - 10.3 mg/dL 9.3 9.8 9.8  Total Protein 6.5 - 8.1 g/dL 5.9(L) 5.6(L) 5.9(L)  Total Bilirubin 0.3 - 1.2 mg/dL 0.5 0.3 0.5  Alkaline Phos 38 -  126 U/L 30(L) 26(L) 30(L)  AST 15 - 41 U/L 16 16 18   ALT 0 - 44 U/L 15 13 16        DIAGNOSTIC IMAGING:  I have independently reviewed the scans and discussed with the patient.   I have reviewed Venita Lick LPN's note and agree with the documentation.  I personally performed a face-to-face visit, made revisions and my assessment and plan is as follows.    ASSESSMENT & PLAN:   Multiple myeloma ([place]) 1.  Stage II IgG kappa multiple myeloma (Dx February 2018): -RVD from 03/01/2017 through 06/29/2017 -Chemotherapy changed to 3 cycles of CCyD from 07/12/2017-04/2017 to obtain deeper response - Status post auto stem cell transplant on 10/08/2017 -Stable M spike after transplant, started on CCyD, cycle 1 on 02/14/2018, cycle 2 on 03/14/2018 -His M spike after 2 cycles was undetectable,  when measured at [facility] last week.  He was recommended to have maintenance with carfilzomib 70 mg/m square on days 1 and 15 every 28 days by Dr. Norma Fredrickson. -He received his first carfilzomib 73m/m2 on 04/20/2018, developed fever with chills 3 hours later with weakness in the extremities.  He was admitted to the hospital, received 1 unit of blood transfusion.  Blood cultures turned out to be negative.  He was febrile after hospitalization and was sent home. - Subsequently he tolerated carfilzomib and smaller doses very well.  We have increased his doses to 60 mg/m on 09/01/2018 and he tolerated 4 doses very well with the addition of dexamethasone. - We have increased his carfilzomib to 70 mg/m on 10/27/2018.  He tolerated it well for first 2 treatments. - Last treatment of high-dose carfilzomib was on 11/28/2018. -He was hospitalized on 11/29/2018 through 12/05/2018 with bronchitis, positive for parainfluenza, acute renal failure and severe thrombocytopenia.  His platelet count went down to 9.  We have given IVIG 1 g/kg x 2 doses.  Platelet count has slowly improved. - I have reviewed literature.  Carfilzomib can  cause dose-related thrombotic microangiopathy.  Peripheral blood smear when he was hospitalized did not show significant schistocytes. -Carfilzomib restarted at a dose of 35 mg per metered square on 01/05/2019.  He is tolerating it very well. -We reviewed myeloma panel from 03/24/2019.  M spike was 0.1.  Kappa light chains was 20.4 and light chain ratio of 2.4 which improved from 2.46 prior. -He will continue carfilzomib every 2 weeks.  We will continue to monitor myeloma panel closely.  2.  Metastatic left breast cancer to the bones: -Diagnosed in 2011.  He status post lumpectomy and continues to be on tamoxifen.  3.  Peripheral neuropathy: -His neuropathy has been stable.  He will continue gabapentin 300 mg twice daily.  4.  ID prophylaxis: -We will continue acyclovir 400 mg twice daily.  5.  Bone strengthening: -He is continuing monthly denosumab.  Calcium is within normal limits.  6.  Macrocytic anemia: -Etiology is CKD.  Procrit 20,000 units weekly was started on 02/10/2019, change to every 2 weeks on 03/10/2019. - Hemoglobin today is 11.1.  He will continue Procrit at the same dose.       Orders placed this encounter:  Orders Placed This Encounter  Procedures   Iron and TIBC   Ferritin   CBC with Differential/Platelet   Comprehensive metabolic panel   Protein electrophoresis, serum   Kappa/lambda light chains   Lactate dehydrogenase   Immunofixation electrophoresis      SDerek Jack MD [phone]

## 2019-04-10 NOTE — Progress Notes (Signed)
3414 CBCD and CMET results reviewed with and pt seen by Dr. Delton Coombes and pt approved for Kyprolis infusion today per MD                                                        [name] tolerated Kyprolis infusion well without complaints or incident. Hgb 11.1 today so Procrit injection held per parameters. VSS upon discharge. Pt discharged self ambulatory in satisfactory condition

## 2019-04-10 NOTE — Assessment & Plan Note (Signed)
1.  Stage II IgG kappa multiple myeloma (Dx February 2018): -RVD from 03/01/2017 through 06/29/2017 -Chemotherapy changed to 3 cycles of CCyD from 07/12/2017-04/2017 to obtain deeper response - Status post auto stem cell transplant on 10/08/2017 -Stable M spike after transplant, started on CCyD, cycle 1 on 02/14/2018, cycle 2 on 03/14/2018 -His M spike after 2 cycles was undetectable, when measured at [facility] last week.  He was recommended to have maintenance with carfilzomib 70 mg/m square on days 1 and 15 every 28 days by Dr. Norma Fredrickson. -He received his first carfilzomib '70mg'$ /m2 on 04/20/2018, developed fever with chills 3 hours later with weakness in the extremities.  He was admitted to the hospital, received 1 unit of blood transfusion.  Blood cultures turned out to be negative.  He was febrile after hospitalization and was sent home. - Subsequently he tolerated carfilzomib and smaller doses very well.  We have increased his doses to 60 mg/m on 09/01/2018 and he tolerated 4 doses very well with the addition of dexamethasone. - We have increased his carfilzomib to 70 mg/m on 10/27/2018.  He tolerated it well for first 2 treatments. - Last treatment of high-dose carfilzomib was on 11/28/2018. -He was hospitalized on 11/29/2018 through 12/05/2018 with bronchitis, positive for parainfluenza, acute renal failure and severe thrombocytopenia.  His platelet count went down to 9.  We have given IVIG 1 g/kg x 2 doses.  Platelet count has slowly improved. - I have reviewed literature.  Carfilzomib can cause dose-related thrombotic microangiopathy.  Peripheral blood smear when he was hospitalized did not show significant schistocytes. -Carfilzomib restarted at a dose of 35 mg per metered square on 01/05/2019.  He is tolerating it very well. -We reviewed myeloma panel from 03/24/2019.  M spike was 0.1.  Kappa light chains was 20.4 and light chain ratio of 2.4 which improved from 2.46 prior. -He will continue carfilzomib  every 2 weeks.  We will continue to monitor myeloma panel closely.  2.  Metastatic left breast cancer to the bones: -Diagnosed in 2011.  He status post lumpectomy and continues to be on tamoxifen.  3.  Peripheral neuropathy: -His neuropathy has been stable.  He will continue gabapentin 300 mg twice daily.  4.  ID prophylaxis: -We will continue acyclovir 400 mg twice daily.  5.  Bone strengthening: -He is continuing monthly denosumab.  Calcium is within normal limits.  6.  Macrocytic anemia: -Etiology is CKD.  Procrit 20,000 units weekly was started on 02/10/2019, change to every 2 weeks on 03/10/2019. - Hemoglobin today is 11.1.  He will continue Procrit at the same dose.

## 2019-04-10 NOTE — Patient Instructions (Signed)
[place] at [facility] Discharge Instructions  You were seen today by Dr. Delton Coombes. He went over your recent lab results. He will see you back in 2 weeks for labs treatment and follow up.   Thank you for choosing [place] at [facility] to provide your oncology and hematology care.  To afford each patient quality time with our provider, please arrive at least 15 minutes before your scheduled appointment time.   If you have a lab appointment with the [facility] please come in thru the  Main Entrance and check in at the main information desk  You need to re-schedule your appointment should you arrive 10 or more minutes late.  We strive to give you quality time with our providers, and arriving late affects you and other patients whose appointments are after yours.  Also, if you no show three or more times for appointments you may be dismissed from the clinic at the providers discretion.     Again, thank you for choosing [facility].  Our hope is that these requests will decrease the amount of time that you wait before being seen by our physicians.       _____________________________________________________________  Should you have questions after your visit to [facility], please contact our office at [phone] between the hours of 8:00 a.m. and 4:30 p.m.  Voicemails left after 4:00 p.m. will not be returned until the following business day.  For prescription refill requests, have your pharmacy contact our office and allow 72 hours.    Cancer Center Support Programs:   > Cancer Support Group  2nd Tuesday of the month 1pm-2pm, Journey Room

## 2019-04-14 ENCOUNTER — Other Ambulatory Visit: Payer: Self-pay

## 2019-04-14 ENCOUNTER — Inpatient Hospital Stay (HOSPITAL_COMMUNITY): Payer: Medicare Other | Attending: Hematology

## 2019-04-14 ENCOUNTER — Encounter (HOSPITAL_COMMUNITY): Payer: Self-pay

## 2019-04-14 VITALS — BP 128/78 | HR 69 | Temp 97.6°F | Resp 18 | Wt 212.0 lb

## 2019-04-14 DIAGNOSIS — C9000 Multiple myeloma not having achieved remission: Secondary | ICD-10-CM | POA: Diagnosis not present

## 2019-04-14 DIAGNOSIS — C50122 Malignant neoplasm of central portion of left male breast: Secondary | ICD-10-CM

## 2019-04-14 DIAGNOSIS — Z79899 Other long term (current) drug therapy: Secondary | ICD-10-CM | POA: Diagnosis not present

## 2019-04-14 DIAGNOSIS — C7951 Secondary malignant neoplasm of bone: Secondary | ICD-10-CM

## 2019-04-14 MED ORDER — DENOSUMAB 120 MG/1.7ML ~~LOC~~ SOLN
120.0000 mg | Freq: Once | SUBCUTANEOUS | Status: AC
  Administered 2019-04-14: 120 mg via SUBCUTANEOUS
  Filled 2019-04-14: qty 1.7

## 2019-04-14 NOTE — Progress Notes (Signed)
Johnny Lucas tolerated Xgeva injection well without complaints or incident. Calcium 9.3 on 04/10/19 and pt denied any tooth or jaw pain or any recent or future dental visits prior to administering this injection. VSS Pt discharged self ambulatory in satisfactory condition

## 2019-04-14 NOTE — Patient Instructions (Signed)
[facility] at [facility] Discharge Instructions  Received Xgeva injection today. Follow-up as scheduled. Call clinic for any questions or concerns   Thank you for choosing [facility] at [facility] to provide your oncology and hematology care.  To afford each patient quality time with our provider, please arrive at least 15 minutes before your scheduled appointment time.   If you have a lab appointment with the Cancer Center please come in thru the  Main Entrance and check in at the main information desk  You need to re-schedule your appointment should you arrive 10 or more minutes late.  We strive to give you quality time with our providers, and arriving late affects you and other patients whose appointments are after yours.  Also, if you no show three or more times for appointments you may be dismissed from the clinic at the providers discretion.     Again, thank you for choosing [facility].  Our hope is that these requests will decrease the amount of time that you wait before being seen by our physicians.       _____________________________________________________________  Should you have questions after your visit to  Cancer Center, please contact our office at [phone] between the hours of 8:00 a.m. and 4:30 p.m.  Voicemails left after 4:00 p.m. will not be returned until the following business day.  For prescription refill requests, have your pharmacy contact our office and allow 72 hours.    Cancer Center Support Programs:   > Cancer Support Group  2nd Tuesday of the month 1pm-2pm, Journey Room   

## 2019-04-24 ENCOUNTER — Inpatient Hospital Stay (HOSPITAL_COMMUNITY): Payer: Medicare Other

## 2019-04-24 ENCOUNTER — Other Ambulatory Visit: Payer: Self-pay

## 2019-04-24 ENCOUNTER — Encounter (HOSPITAL_COMMUNITY): Payer: Self-pay | Admitting: Hematology

## 2019-04-24 ENCOUNTER — Inpatient Hospital Stay (HOSPITAL_BASED_OUTPATIENT_CLINIC_OR_DEPARTMENT_OTHER): Payer: Medicare Other | Admitting: Hematology

## 2019-04-24 VITALS — BP 114/65 | HR 61 | Temp 97.7°F

## 2019-04-24 DIAGNOSIS — C9000 Multiple myeloma not having achieved remission: Secondary | ICD-10-CM

## 2019-04-24 DIAGNOSIS — C50922 Malignant neoplasm of unspecified site of left male breast: Secondary | ICD-10-CM | POA: Diagnosis not present

## 2019-04-24 DIAGNOSIS — Z79899 Other long term (current) drug therapy: Secondary | ICD-10-CM

## 2019-04-24 DIAGNOSIS — Z5111 Encounter for antineoplastic chemotherapy: Secondary | ICD-10-CM | POA: Diagnosis not present

## 2019-04-24 DIAGNOSIS — Z79810 Long term (current) use of selective estrogen receptor modulators (SERMs): Secondary | ICD-10-CM | POA: Diagnosis not present

## 2019-04-24 DIAGNOSIS — C7951 Secondary malignant neoplasm of bone: Secondary | ICD-10-CM

## 2019-04-24 DIAGNOSIS — D696 Thrombocytopenia, unspecified: Secondary | ICD-10-CM | POA: Diagnosis not present

## 2019-04-24 DIAGNOSIS — Z23 Encounter for immunization: Secondary | ICD-10-CM | POA: Diagnosis not present

## 2019-04-24 DIAGNOSIS — C50122 Malignant neoplasm of central portion of left male breast: Secondary | ICD-10-CM

## 2019-04-24 LAB — COMPREHENSIVE METABOLIC PANEL
ALT: 15 U/L (ref 0–44)
AST: 15 U/L (ref 15–41)
Albumin: 3.7 g/dL (ref 3.5–5.0)
Alkaline Phosphatase: 27 U/L — ABNORMAL LOW (ref 38–126)
Anion gap: 8 (ref 5–15)
BUN: 25 mg/dL — ABNORMAL HIGH (ref 8–23)
CO2: 24 mmol/L (ref 22–32)
Calcium: 9 mg/dL (ref 8.9–10.3)
Chloride: 105 mmol/L (ref 98–111)
Creatinine, Ser: 1.91 mg/dL — ABNORMAL HIGH (ref 0.61–1.24)
GFR calc Af Amer: 40 mL/min — ABNORMAL LOW (ref >60)
GFR calc non Af Amer: 34 mL/min — ABNORMAL LOW (ref >60)
Glucose, Bld: 100 mg/dL — ABNORMAL HIGH (ref 70–99)
Potassium: 4.1 mmol/L (ref 3.5–5.1)
Sodium: 137 mmol/L (ref 135–145)
Total Bilirubin: 0.3 mg/dL (ref 0.3–1.2)
Total Protein: 5.8 g/dL — ABNORMAL LOW (ref 6.5–8.1)

## 2019-04-24 LAB — CBC WITH DIFFERENTIAL/PLATELET
Abs Immature Granulocytes: 0.02 10*3/uL (ref 0.00–0.07)
Basophils Absolute: 0.1 10*3/uL (ref 0.0–0.1)
Basophils Relative: 1 %
Eosinophils Absolute: 0.5 10*3/uL (ref 0.0–0.5)
Eosinophils Relative: 6 %
HCT: 32.3 % — ABNORMAL LOW (ref 39.0–52.0)
Hemoglobin: 10.5 g/dL — ABNORMAL LOW (ref 13.0–17.0)
Immature Granulocytes: 0 %
Lymphocytes Relative: 14 %
Lymphs Abs: 1.2 10*3/uL (ref 0.7–4.0)
MCH: 34.3 pg — ABNORMAL HIGH (ref 26.0–34.0)
MCHC: 32.5 g/dL (ref 30.0–36.0)
MCV: 105.6 fL — ABNORMAL HIGH (ref 80.0–100.0)
Monocytes Absolute: 0.7 10*3/uL (ref 0.1–1.0)
Monocytes Relative: 9 %
Neutro Abs: 6.1 10*3/uL (ref 1.7–7.7)
Neutrophils Relative %: 70 %
Platelets: 179 10*3/uL (ref 150–400)
RBC: 3.06 MIL/uL — ABNORMAL LOW (ref 4.22–5.81)
RDW: 13.4 % (ref 11.5–15.5)
WBC: 8.6 10*3/uL (ref 4.0–10.5)
nRBC: 0 % (ref 0.0–0.2)

## 2019-04-24 LAB — IRON AND TIBC
Iron: 98 ug/dL (ref 45–182)
Saturation Ratios: 31 % (ref 17.9–39.5)
TIBC: 319 ug/dL (ref 250–450)
UIBC: 221 ug/dL

## 2019-04-24 LAB — FERRITIN: Ferritin: 299 ng/mL (ref 24–336)

## 2019-04-24 LAB — LACTATE DEHYDROGENASE: LDH: 125 U/L (ref 98–192)

## 2019-04-24 MED ORDER — ACETAMINOPHEN 325 MG PO TABS
650.0000 mg | ORAL_TABLET | Freq: Once | ORAL | Status: AC
  Administered 2019-04-24: 650 mg via ORAL
  Filled 2019-04-24: qty 2

## 2019-04-24 MED ORDER — SODIUM CHLORIDE 0.9 % IV SOLN
Freq: Once | INTRAVENOUS | Status: AC
  Administered 2019-04-24: 11:00:00 via INTRAVENOUS

## 2019-04-24 MED ORDER — SODIUM CHLORIDE 0.9 % IV SOLN
Freq: Once | INTRAVENOUS | Status: AC
  Administered 2019-04-24: 14:00:00 via INTRAVENOUS

## 2019-04-24 MED ORDER — DEXTROSE 5 % IV SOLN
34.0000 mg/m2 | Freq: Once | INTRAVENOUS | Status: AC
  Administered 2019-04-24: 70 mg via INTRAVENOUS
  Filled 2019-04-24: qty 5

## 2019-04-24 MED ORDER — SODIUM CHLORIDE 0.9% FLUSH
10.0000 mL | INTRAVENOUS | Status: DC | PRN
  Administered 2019-04-24: 11:00:00 10 mL
  Filled 2019-04-24: qty 10

## 2019-04-24 MED ORDER — EPOETIN ALFA 20000 UNIT/ML IJ SOLN: 20000.0000 [IU] | Freq: Once | INTRAMUSCULAR | Status: DC

## 2019-04-24 MED ORDER — SODIUM CHLORIDE 0.9 % IV SOLN
20.0000 mg | Freq: Once | INTRAVENOUS | Status: AC
  Administered 2019-04-24: 20 mg via INTRAVENOUS
  Filled 2019-04-24: qty 20

## 2019-04-24 MED ORDER — HEPARIN SOD (PORK) LOCK FLUSH 100 UNIT/ML IV SOLN
500.0000 [IU] | Freq: Once | INTRAVENOUS | Status: AC | PRN
  Administered 2019-04-24: 500 [IU]

## 2019-04-24 NOTE — Patient Instructions (Addendum)
[facility] at [facility] Discharge Instructions  You were seen today by Dr. Katragadda. He went over your recent lab results. He will see you back in 4 weeks for labs and follow up.   Thank you for choosing [facility] at [facility] to provide your oncology and hematology care.  To afford each patient quality time with our provider, please arrive at least 15 minutes before your scheduled appointment time.   If you have a lab appointment with the Cancer Center please come in thru the  Main Entrance and check in at the main information desk  You need to re-schedule your appointment should you arrive 10 or more minutes late.  We strive to give you quality time with our providers, and arriving late affects you and other patients whose appointments are after yours.  Also, if you no show three or more times for appointments you may be dismissed from the clinic at the providers discretion.     Again, thank you for choosing [facility].  Our hope is that these requests will decrease the amount of time that you wait before being seen by our physicians.       _____________________________________________________________  Should you have questions after your visit to  Cancer Center, please contact our office at [phone] between the hours of 8:00 a.m. and 4:30 p.m.  Voicemails left after 4:00 p.m. will not be returned until the following business day.  For prescription refill requests, have your pharmacy contact our office and allow 72 hours.    Cancer Center Support Programs:   > Cancer Support Group  2nd Tuesday of the month 1pm-2pm, Journey Room    

## 2019-04-24 NOTE — Progress Notes (Signed)
04/24/19  Cannot receive Procrit today with Hgb 10.5 due to also receiving chemotherapy and does not meet parameters.  Henreitta Leber, PharmD

## 2019-04-24 NOTE — Patient Instructions (Signed)
[facility] Discharge Instructions for Patients Receiving Chemotherapy   Beginning January 23rd 2017 lab work for the [facility] will be done in the  Main lab at [facility] on 1st floor. If you have a lab appointment with the [facility] please come in thru the  Main Entrance and check in at the main information desk   Today you received the following chemotherapy agents Kyprolis.Follow-up as scheduled. Call clinic for any questions or concerns  To help prevent nausea and vomiting after your treatment, we encourage you to take your nausea medication   If you develop nausea and vomiting, or diarrhea that is not controlled by your medication, call the clinic.  The clinic phone number is [phone]. Office hours are Monday-Friday 8:30am-5:00pm.  BELOW ARE SYMPTOMS THAT SHOULD BE REPORTED IMMEDIATELY:  *FEVER GREATER THAN 101.0 F  *CHILLS WITH OR WITHOUT FEVER  NAUSEA AND VOMITING THAT IS NOT CONTROLLED WITH YOUR NAUSEA MEDICATION  *UNUSUAL SHORTNESS OF BREATH  *UNUSUAL BRUISING OR BLEEDING  TENDERNESS IN MOUTH AND THROAT WITH OR WITHOUT PRESENCE OF ULCERS  *URINARY PROBLEMS  *BOWEL PROBLEMS  UNUSUAL RASH Items with * indicate a potential emergency and should be followed up as soon as possible. If you have an emergency after office hours please contact your primary care physician or go to the nearest emergency department.  Please call the clinic during office hours if you have any questions or concerns.   You may also contact the Patient Navigator at [phone] should you have any questions or need assistance in obtaining follow up care.      Resources For Cancer Patients and their Caregivers ? American Cancer Society: Can assist with transportation, wigs, general needs, runs Look Good Feel Better.        (321)782-0330 ? Cancer Care: Provides financial assistance, online support groups, medication/co-pay assistance.  1-800-813-HOPE  810-477-4354) ? [facility] Assists [place] cancer patients and their families through emotional , educational and financial support.  303 503 4411 ? [place] DSS Where to apply for food stamps, Medicaid and utility assistance. 778-028-6303 ? [facility]: Transportation to medical appointments. 684-853-3802 ? Social Security Administration: May apply for disability if have a Stage IV cancer. 325 641 4955 253-684-9387 ? [employer], Disability and Transit Services: Assists with nutrition, care and transit needs. (306) [phone]

## 2019-04-24 NOTE — Progress Notes (Signed)
Johnny Lucas, Johnny Lucas   CLINIC:  Medical Oncology/Hematology  PCP:  Johnny Percy, MD [place] [address] [phone]   REASON FOR VISIT:  Follow-up for multiple myelomaS/P stem cell transplant  CURRENT THERAPY:Carfilzomib every 2 weeks   BRIEF ONCOLOGIC HISTORY:    Breast cancer, male (Johnny Lucas)   12/31/2009 Initial Biopsy    Biopsy of L breast     12/31/2009 Pathology Results    Invasive ductal carcinoma, ER/PR+, HER 2 negative    12/31/2009 Imaging    Ultrasound showing a 2.43 x 1.85 x 3 cm hypoechoic spiculated mass in the 12 o clock L breast retroareolar region    01/01/2010 -  Anti-estrogen oral therapy    Tamoxifen 20 mg daily    01/06/2010 Imaging    Bone scan abnormal uptake in the diaphysis of the R humerus, abnormal in the R third, fifth and sixth ribs, lesion also noted in the sternum.    02/03/2010 Surgery    Rod placement and fixation of R humerus by Dr. [name]    02/05/2010 - 02/18/2010 Radiation Therapy    30Gy in 10 fractions of 3 Gy per fraction to R pathologic fracture    03/11/2010 -  Chemotherapy    Denosumab monthly, now every 3 months. Started at [facility]     06/09/2016 Imaging    Three hypermetabolic osseous lesions in the sternum, left ilium and right ilium, as discussed above, likely represent osseous metastases. At this time, these are not recognizable on the CT images. 2. No extra skeletal metastatic disease identified in the neck, chest, abdomen or pelvis.    10/13/2016 Progression    PET shows various new and enlarging osseous metastatic lesions with no definite extra osseous metastatic disease currently identified.     12/31/2016 Progression    1. Multifocal hypermetabolic osseous metastases throughout the axial and proximal appendicular skeleton, which are increased in size, number and metabolism since 10/13/2016 PET-CT. 2. 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 3. No additional sites of hypermetabolic metastatic disease. 4. Chronic right mastoid sinusitis. 5. Aortic atherosclerosis.  One vessel coronary atherosclerosis.     Multiple myeloma ([place])   02/12/2017 Bone Marrow Biopsy    The marrow was variably cellular with large peritrabecular aggregates of kappa restricted plasma cells (66% by aspirate, 30% by Cd138). Cytogenetics +11.     03/01/2017 - 06/29/2017 Chemotherapy    RVD     05/26/2017 Bone Marrow Biopsy    Performed at [facility]:  Plasma cell myeloma in a 30% cellularmarrow with decreased trilineage hematopoiesis and 42% kappalight chain restricted plasma cells on the aspirate smears andlarge aggregates on the core biopsy.     07/12/2017 - 09/01/2017 Chemotherapy    3 cycles of carfizolmib/cyclophosphamide/dexamethasone      10/08/2017 Bone Marrow Transplant    [facility]otrans[facility] at [facility]      CANCER STAGING: Cancer Staging Multiple myeloma [facility]) Staging form: Plasma Cell Myeloma and Plasma Cell Disorders, AJCC 8th Edition - Clinical stage from 05/03/2017: Beta-2-microglobulin (mg/L): 3.5, Albumin (g/dL): 3.4, ISS: Stage II, High-risk cytogenetics: Absent, LDH: Not assessed - Signed by [facility], PA-C on 05/03/2017    INTERVAL HISTORY:  Johnny Lucas 73 y.o. male returns for routine follow-up and consideration for next cycle of chemotherapy. He is here today alone. He states that  he feels great today and has felt great since his last visit. Denies any nausea, vomiting, or diarrhea. Denies any new pains. Had not noticed any recent bleeding such as epistaxis, hematuria or hematochezia. Denies recent chest pain on exertion, shortness of breath on minimal exertion, pre-syncopal episodes, or palpitations. Denies any numbness or tingling in hands or feet. Denies any recent fevers, infections, or recent  hospitalizations. Patient reports appetite at 100% and energy level at 75%.     REVIEW OF SYSTEMS:  Review of Systems  All other systems reviewed and are negative.    PAST MEDICAL/SURGICAL HISTORY:  Past Medical History:  Diagnosis Date   Anxiety    Bone metastases ([place]) 09/10/2016   Breast cancer ([place]) 2011   Stave IV breast cancer; radiation and tamoxifen   Breast cancer, male ([place])    Stave IV breast cancer; radiation and tamoxifen  Overview:  Left breast ca with mets bone  Overview:  METS TO BONE   GERD (gastroesophageal reflux disease)    Hypertension    Macular degeneration    Multiple myeloma ([place]) 02/18/2017   Peripheral neuropathy    Past Surgical History:  Procedure Laterality Date   BACK SURGERY     CATARACT EXTRACTION W/PHACO Left 02/03/2019   Procedure: CATARACT EXTRACTION PHACO AND INTRAOCULAR LENS PLACEMENT ([place]);  Surgeon: Baruch Goldmann, MD;  Location: [facility];  Service: Ophthalmology;  Laterality: Left;  CDE: 15.89   HERNIA REPAIR     PORTACATH PLACEMENT Left 06/10/2018   Procedure: INSERTION PORT-A-CATH;  Surgeon: Virl Cagey, MD;  Location: [facility];  Service: General;  Laterality: Left;   right arm surgery       SOCIAL HISTORY:  Social History   Socioeconomic History   Marital status: Married    Spouse name: Not on file   Number of children: 3   Years of education: Not on file   Highest education level: Not on file  Occupational History   Not on file  Social Needs   Financial resource strain: Not on file   Food insecurity:    Worry: Not on file    Inability: Not on file   Transportation needs:    Medical: Not on file    Non-medical: Not on file  Tobacco Use   Smoking status: Never Smoker   Smokeless tobacco: Former Systems developer  Substance and Sexual Activity   Alcohol use: Yes    Alcohol/week: 24.0 standard drinks    Types: 24 Cans of beer per week   Drug use: No   Sexual activity: Not on file  Lifestyle    Physical activity:    Days per week: Not on file    Minutes per session: Not on file   Stress: Not on file  Relationships   Social connections:    Talks on phone: Not on file    Gets together: Not on file    Attends religious service: Not on file    Active member of club or organization: Not on file    Attends meetings of clubs or organizations: Not on file    Relationship status: Not on file   Intimate partner violence:    Fear of current or ex partner: Not on file    Emotionally abused: Not on file    Physically abused: Not on file    Forced sexual activity: Not on file  Other Topics Concern   Not on file  Social History Narrative   Not on file    FAMILY  HISTORY:  Family History  Problem Relation Age of Onset   Stroke Mother    Cancer Maternal Aunt        cancer NOS; died in her 59s   Lung cancer Maternal Uncle        smoker    CURRENT MEDICATIONS:  Outpatient Encounter Medications as of 04/24/2019  Medication Sig Note   acyclovir (ZOVIRAX) 800 MG tablet Take 1 tablet (800 mg total) by mouth 2 (two) times daily.    ALPRAZolam (XANAX) 0.5 MG tablet Take 1 tablet (0.5 mg total) by mouth at bedtime as needed for anxiety or sleep. TAKE 1 TABLET BY MOUTH AT BEDTIME AS NEEDED FOR ANXIETY    amLODipine (NORVASC) 5 MG tablet Take 1 tablet (5 mg total) by mouth daily.    aspirin EC 81 MG tablet Take 81 mg by mouth daily.    calcium-vitamin D (OSCAL WITH D) 500-200 MG-UNIT TABS tablet Take 2 tablets by mouth daily. (Patient taking differently: Take 1 tablet by mouth 2 (two) times daily. )    Cholecalciferol (VITAMIN D) 50 MCG (2000 UT) CAPS Take 4,000 Units by mouth 2 (two) times daily.    denosumab (XGEVA) 120 MG/1.7ML SOLN injection Inject 120 mg into the skin once. 01/18/2019: GIVEN AT HOSPITAL    folic acid (FOLVITE) 1 MG tablet Take 1 mg by mouth daily.     gabapentin (NEURONTIN) 300 MG capsule Take 1 capsule (300 mg total) by mouth 2 (two) times daily. Take  two times a day, then increase to three times a day in a few weeks. (Patient taking differently: Take 300 mg by mouth 2 (two) times daily. )    hydrALAZINE (APRESOLINE) 50 MG tablet Take 1 tablet (50 mg total) by mouth every 8 (eight) hours. (Patient taking differently: Take 50 mg by mouth 2 (two) times daily. )    loratadine (CLARITIN) 10 MG tablet Take 1 tablet (10 mg total) by mouth daily.    metoprolol succinate (TOPROL-XL) 100 MG 24 hr tablet Take 100 mg by mouth daily.     Multiple Vitamin (MULTIVITAMIN WITH MINERALS) TABS tablet Take 1 tablet by mouth daily.    ondansetron (ZOFRAN) 8 MG tablet TAKE ONE TABLET BY MOUTH EVERY 8 HOURS AS NEEDED    ondansetron (ZOFRAN) 8 MG tablet Take 1 tablet (8 mg total) by mouth every 8 (eight) hours as needed for nausea or vomiting.    pantoprazole (PROTONIX) 40 MG tablet Take 40 mg by mouth every other day.     polyethylene glycol (MIRALAX / GLYCOLAX) packet Take 17 g by mouth daily as needed for mild constipation.    prochlorperazine (COMPAZINE) 10 MG tablet Take 1 tablet (10 mg total) by mouth every 6 (six) hours as needed for nausea or vomiting.    pseudoephedrine-guaifenesin (MUCINEX D) 60-600 MG 12 hr tablet Take 1 tablet by mouth 2 (two) times daily as needed for congestion.     tamoxifen (NOLVADEX) 20 MG tablet Take 1 tablet (20 mg total) by mouth daily.    vitamin B-12 (CYANOCOBALAMIN) 1000 MCG tablet Take 1,000 mcg by mouth daily.    [DISCONTINUED] prochlorperazine (COMPAZINE) 10 MG tablet TAKE ONE TABLET BY MOUTH EVERY 6 HOURS AS NEEDED    Facility-Administered Encounter Medications as of 04/24/2019  Medication   heparin lock flush 100 unit/mL   sodium chloride flush (NS) 0.9 % injection 10 mL   sodium chloride flush (NS) 0.9 % injection 10 mL    ALLERGIES:  Allergies  Allergen  Reactions   Cefepime     Suspected severe thrombocytopenia is a result of cefepime induced antigen platelet destruction     PHYSICAL EXAM:    ECOG Performance status: 1  Vitals:   04/24/19 0920  BP: 109/64  Pulse: 69  Resp: 18  Temp: 98.2 F (36.8 C)  SpO2: 97%   Filed Weights   04/24/19 0920  Weight: 213 lb (96.6 kg)    Physical Exam Vitals signs reviewed.  Constitutional:      Appearance: Normal appearance.  Cardiovascular:     Rate and Rhythm: Normal rate and regular rhythm.     Heart sounds: Normal heart sounds.  Pulmonary:     Effort: Pulmonary effort is normal.     Breath sounds: Normal breath sounds.  Abdominal:     General: There is no distension.     Palpations: Abdomen is soft. There is no mass.  Skin:    General: Skin is warm.  Neurological:     General: No focal deficit present.     Mental Status: He is alert and oriented to person, place, and time.  Psychiatric:        Mood and Affect: Mood normal.        Behavior: Behavior normal.      LABORATORY DATA:  I have reviewed the labs as listed.  CBC    Component Value Date/Time   WBC 8.6 04/24/2019 0844   RBC 3.06 (L) 04/24/2019 0844   HGB 10.5 (L) 04/24/2019 0844   HCT 32.3 (L) 04/24/2019 0844   PLT 179 04/24/2019 0844   MCV 105.6 (H) 04/24/2019 0844   MCH 34.3 (H) 04/24/2019 0844   MCHC 32.5 04/24/2019 0844   RDW 13.4 04/24/2019 0844   LYMPHSABS 1.2 04/24/2019 0844   MONOABS 0.7 04/24/2019 0844   EOSABS 0.5 04/24/2019 0844   BASOSABS 0.1 04/24/2019 0844   CMP Latest Ref Rng & Units 04/24/2019 04/10/2019 03/24/2019  Glucose 70 - 99 mg/dL 100(H) 100(H) 94  BUN 8 - 23 mg/dL 25(H) 20 18  Creatinine 0.61 - 1.24 mg/dL 1.91(H) 1.75(H) 1.69(H)  Sodium 135 - 145 mmol/L 137 138 137  Potassium 3.5 - 5.1 mmol/L 4.1 3.8 4.0  Chloride 98 - 111 mmol/L 105 105 106  CO2 22 - 32 mmol/L 24 26 23   Calcium 8.9 - 10.3 mg/dL 9.0 9.3 9.8  Total Protein 6.5 - 8.1 g/dL 5.8(L) 5.9(L) 5.6(L)  Total Bilirubin 0.3 - 1.2 mg/dL 0.3 0.5 0.3  Alkaline Phos 38 - 126 U/L 27(L) 30(L) 26(L)  AST 15 - 41 U/L 15 16 16   ALT 0 - 44 U/L 15 15 13         DIAGNOSTIC IMAGING:  I have independently reviewed the scans and discussed with the patient.   I have reviewed Venita Lick LPN's note and agree with the documentation.  I personally performed a face-to-face visit, made revisions and my assessment and plan is as follows.    ASSESSMENT & PLAN:   Multiple myeloma ([place]) 1.  Stage II IgG kappa multiple myeloma (Dx February 2018): -RVD from 03/01/2017 through 06/29/2017 -Chemotherapy changed to 3 cycles of CCyD from 07/12/2017-04/2017 to obtain deeper response - Status post auto stem cell transplant on 10/08/2017 -Stable M spike after transplant, started on CCyD, cycle 1 on 02/14/2018, cycle 2 on 03/14/2018 -His M spike after 2 cycles was undetectable, when measured at [facility] last week.  He was recommended to have maintenance with carfilzomib 70 mg/m square on days 1 and  15 every 28 days by Dr. Norma Fredrickson. -He received his first carfilzomib 17m/m2 on 04/20/2018, developed fever with chills 3 hours later with weakness in the extremities.  He was admitted to the hospital, received 1 unit of blood transfusion.  Blood cultures turned out to be negative.  He was febrile after hospitalization and was sent home. - Subsequently he tolerated carfilzomib and smaller doses very well.  We have increased his doses to 60 mg/m on 09/01/2018 and he tolerated 4 doses very well with the addition of dexamethasone. - We have increased his carfilzomib to 70 mg/m on 10/27/2018.  He tolerated it well for first 2 treatments. - Last treatment of high-dose carfilzomib was on 11/28/2018. -He was hospitalized on 11/29/2018 through 12/05/2018 with bronchitis, positive for parainfluenza, acute renal failure and severe thrombocytopenia.  His platelet count went down to 9.  We have given IVIG 1 g/kg x 2 doses.  Platelet count has slowly improved. - I have reviewed literature.  Carfilzomib can cause dose-related thrombotic microangiopathy.  Peripheral blood smear when he was  hospitalized did not show significant schistocytes. -Carfilzomib restarted at a dose of 35 mg per metered square on 01/05/2019. -We tolerated multiple myeloma panel from 03/24/2019.  M spike is 0.1.  Free light chain ratio is 2.4. -Myeloma panel from today is pending. -I have reviewed all his blood work.  He will proceed with carfilzomib today.  I will see him back in 4 weeks for follow-up. -he will continue acyclovir twice daily.  2.  Metastatic left breast cancer to the bones: -Diagnosed in 2011.  He status post lumpectomy and continues to be on tamoxifen.  3.  Peripheral neuropathy: -His neuropathy has been stable.  He will continue gabapentin 300 mg twice daily.  4.  ID prophylaxis: -We will continue acyclovir 400 mg twice daily.  5.  Bone strengthening: -We will continue monthly denosumab.  He will continue calcium supplements.   6.  Macrocytic anemia: -Etiology is CKD.  He is currently receiving Procrit every other week. -Hemoglobin today is 10.5.  Ferritin is 299% saturation is 31.    Total time spent is 25 minutes with more than 50% of the time spent face-to-face discussing treatment plan, side effects and coordination of care.    Orders placed this encounter:  No orders of the defined types were placed in this encounter.     SDerek Jack MD [phone]

## 2019-04-24 NOTE — Assessment & Plan Note (Signed)
1.  Stage II IgG kappa multiple myeloma (Dx February 2018): -RVD from 03/01/2017 through 06/29/2017 -Chemotherapy changed to 3 cycles of CCyD from 07/12/2017-04/2017 to obtain deeper response - Status post auto stem cell transplant on 10/08/2017 -Stable M spike after transplant, started on CCyD, cycle 1 on 02/14/2018, cycle 2 on 03/14/2018 -His M spike after 2 cycles was undetectable, when measured at [facility] last week.  He was recommended to have maintenance with carfilzomib 70 mg/m square on days 1 and 15 every 28 days by Dr. Norma Fredrickson. -He received his first carfilzomib 40m/m2 on 04/20/2018, developed fever with chills 3 hours later with weakness in the extremities.  He was admitted to the hospital, received 1 unit of blood transfusion.  Blood cultures turned out to be negative.  He was febrile after hospitalization and was sent home. - Subsequently he tolerated carfilzomib and smaller doses very well.  We have increased his doses to 60 mg/m on 09/01/2018 and he tolerated 4 doses very well with the addition of dexamethasone. - We have increased his carfilzomib to 70 mg/m on 10/27/2018.  He tolerated it well for first 2 treatments. - Last treatment of high-dose carfilzomib was on 11/28/2018. -He was hospitalized on 11/29/2018 through 12/05/2018 with bronchitis, positive for parainfluenza, acute renal failure and severe thrombocytopenia.  His platelet count went down to 9.  We have given IVIG 1 g/kg x 2 doses.  Platelet count has slowly improved. - I have reviewed literature.  Carfilzomib can cause dose-related thrombotic microangiopathy.  Peripheral blood smear when he was hospitalized did not show significant schistocytes. -Carfilzomib restarted at a dose of 35 mg per metered square on 01/05/2019. -We tolerated multiple myeloma panel from 03/24/2019.  M spike is 0.1.  Free light chain ratio is 2.4. -Myeloma panel from today is pending. -I have reviewed all his blood work.  He will proceed with carfilzomib  today.  I will see him back in 4 weeks for follow-up. -he will continue acyclovir twice daily.  2.  Metastatic left breast cancer to the bones: -Diagnosed in 2011.  He status post lumpectomy and continues to be on tamoxifen.  3.  Peripheral neuropathy: -His neuropathy has been stable.  He will continue gabapentin 300 mg twice daily.  4.  ID prophylaxis: -We will continue acyclovir 400 mg twice daily.  5.  Bone strengthening: -We will continue monthly denosumab.  He will continue calcium supplements.   6.  Macrocytic anemia: -Etiology is CKD.  He is currently receiving Procrit every other week. -Hemoglobin today is 10.5.  Ferritin is 299% saturation is 31.

## 2019-04-24 NOTE — Progress Notes (Signed)
1115 CBCD and CMET results reviewed with and pt seen by Dr. Delton Coombes and pt approved for chemo tx today per MD                                                                     Johnny Lucas tolerated chemo tx well without complaints or incident. VSS upon discharge. Pt discharged self ambulatory in satisfactory condition

## 2019-04-25 ENCOUNTER — Other Ambulatory Visit: Payer: Self-pay | Admitting: *Deleted

## 2019-04-25 LAB — PROTEIN ELECTROPHORESIS, SERUM
A/G Ratio: 1.6 (ref 0.7–1.7)
Albumin ELP: 3.4 g/dL (ref 2.9–4.4)
Alpha-1-Globulin: 0.2 g/dL (ref 0.0–0.4)
Alpha-2-Globulin: 0.6 g/dL (ref 0.4–1.0)
Beta Globulin: 0.8 g/dL (ref 0.7–1.3)
Gamma Globulin: 0.4 g/dL (ref 0.4–1.8)
Globulin, Total: 2.1 g/dL — ABNORMAL LOW (ref 2.2–3.9)
M-Spike, %: 0.1 g/dL — ABNORMAL HIGH
Total Protein ELP: 5.5 g/dL — ABNORMAL LOW (ref 6.0–8.5)

## 2019-04-25 LAB — KAPPA/LAMBDA LIGHT CHAINS
Kappa free light chain: 22.1 mg/L — ABNORMAL HIGH (ref 3.3–19.4)
Kappa, lambda light chain ratio: 3.75 — ABNORMAL HIGH (ref 0.26–1.65)
Lambda free light chains: 5.9 mg/L (ref 5.7–26.3)

## 2019-04-25 LAB — IMMUNOFIXATION ELECTROPHORESIS
IgA: 9 mg/dL — ABNORMAL LOW (ref 61–437)
IgG (Immunoglobin G), Serum: 450 mg/dL — ABNORMAL LOW (ref 603–1613)
IgM (Immunoglobulin M), Srm: 18 mg/dL (ref 15–143)
Total Protein ELP: 5.5 g/dL — ABNORMAL LOW (ref 6.0–8.5)

## 2019-04-25 NOTE — Patient Outreach (Signed)
[facility]) Care Management  04/25/2019  Johnny Lucas Mar 18, 1946 [id number]   Telephone screening call    Referral source: Insurance plan Referral reason :Tier 5,   Initial outreach call to patient, no answer able to leave a HIPAA compliant message for return call.    Plan Will send unsuccessful outreach letter Will plan return call in the next 4 business days.    Joylene Draft, RN, [place] Management Coordinator  [phone]- Mobile [phone]- Toll Free Main Office

## 2019-04-26 ENCOUNTER — Other Ambulatory Visit: Payer: Self-pay

## 2019-04-27 ENCOUNTER — Encounter (HOSPITAL_COMMUNITY): Payer: Self-pay

## 2019-04-27 ENCOUNTER — Inpatient Hospital Stay (HOSPITAL_COMMUNITY): Payer: Medicare Other | Attending: Hematology

## 2019-04-27 VITALS — BP 117/66 | HR 70 | Temp 98.2°F | Resp 18

## 2019-04-27 DIAGNOSIS — Z79899 Other long term (current) drug therapy: Secondary | ICD-10-CM | POA: Insufficient documentation

## 2019-04-27 DIAGNOSIS — C7951 Secondary malignant neoplasm of bone: Secondary | ICD-10-CM

## 2019-04-27 DIAGNOSIS — C9000 Multiple myeloma not having achieved remission: Secondary | ICD-10-CM | POA: Diagnosis not present

## 2019-04-27 MED ORDER — HEPARIN SOD (PORK) LOCK FLUSH 100 UNIT/ML IV SOLN
500.0000 [IU] | Freq: Once | INTRAVENOUS | Status: AC
  Administered 2019-04-27: 13:00:00 500 [IU] via INTRAVENOUS

## 2019-04-27 MED ORDER — SODIUM CHLORIDE 0.9 % IV SOLN
510.0000 mg | Freq: Once | INTRAVENOUS | Status: AC
  Administered 2019-04-27: 12:00:00 510 mg via INTRAVENOUS
  Filled 2019-04-27: qty 510

## 2019-04-27 MED ORDER — SODIUM CHLORIDE 0.9 % IV SOLN
Freq: Once | INTRAVENOUS | Status: AC
  Administered 2019-04-27: 11:00:00 via INTRAVENOUS

## 2019-04-27 NOTE — Patient Instructions (Signed)
[name]  Discharge Instructions:   _______________________________________________________________  Thank you for choosing [facility] to provide your oncology and hematology care.  To afford each patient quality time with our providers, please arrive at least 15 minutes before your scheduled appointment.  You need to re-schedule your appointment if you arrive 10 or more minutes late.  We strive to give you quality time with our providers, and arriving late affects you and other patients whose appointments are after yours.  Also, if you no show three or more times for appointments you may be dismissed from the clinic.  Again, thank you for choosing [facility] at [facility]. Our hope is that these requests will allow you access to exceptional care and in a timely manner. _______________________________________________________________  If you have questions after your visit, please contact our office at [phone] between the hours of 8:30 a.m. and 5:00 p.m. Voicemails left after 4:30 p.m. will not be returned until the following business day. _______________________________________________________________  For prescription refill requests, have your pharmacy contact our office. _______________________________________________________________  Recommendations made by the consultant and any test results will be sent to your referring physician. _______________________________________________________________ 

## 2019-04-27 NOTE — Progress Notes (Signed)
Feraheme given today per MD orders. Tolerated infusion without adverse affects. Vital signs stable. No complaints at this time. Discharged from clinic ambulatory. F/U with [facility] as scheduled.  

## 2019-04-28 ENCOUNTER — Other Ambulatory Visit: Payer: Self-pay | Admitting: *Deleted

## 2019-04-28 NOTE — Patient Outreach (Signed)
[facility]) Care Management  04/28/2019  GRACIN MCPARTLAND May 29, 1946 [id number]   Telephone screening call #2  Referral source: Insurance referral, Tier 5   Outreach call to patient, no answer able to leave a HIPAA compliant message for return call.   Plan  Will plan return call in the next 4 business days.    Joylene Draft, RN, [place] Management Coordinator  [phone]- Mobile [phone]- Toll Free Main Office

## 2019-05-03 ENCOUNTER — Other Ambulatory Visit: Payer: Self-pay | Admitting: *Deleted

## 2019-05-03 NOTE — Patient Outreach (Addendum)
[facility]) Care Management  05/03/2019  Johnny Lucas 11-28-1946 [id number]   [facility] screening call #3  Referral source: Insurance Plan   Outreach call to patient , no answer able to leave a HIPAA compliant message for return call   Patient returned call , explained reason for the call and [facility] care management services.  Patient discussed feeling pretty good on today, he discussed current medical conditions  Conditions Multiple Myleoma Patient discussed having stem cell transplant and receiving  Chemotherapy every 2 to 4 weeks he see a nurse for injection or at infusion  , at this time he is uncertain how long this will go on .  Hypertension Patient reports that his blood pressure is under good control he is able to monitor at home and it gets check frequently at visit.   Social  Patient lives at home with his wife , he is independent with daily care. Patient does not drive due to macular degeneration, his wife is able to drive to appointments reports about 10 mile trip to appointment.  Patient denies need for use of walker or cane.  Medications Denies medication cost concerns manages his daily medication, wife is able to assist if needed.  Advanced Directive Patient has HCPOA  Consent  Explained and offered [facility] care management service a non cost benefit of his medicare insurance plan,explained at this time services will be telephonic. Patient declines care management service needs at this time discussed his frequent visit with nurse and cancer center .  Plan Will send successful outreach letter.    Joylene Draft, RN, [place] Management Coordinator  [phone]- Mobile [phone]- Toll Free Main Office

## 2019-05-08 ENCOUNTER — Other Ambulatory Visit: Payer: Self-pay | Admitting: *Deleted

## 2019-05-08 ENCOUNTER — Other Ambulatory Visit: Payer: Self-pay

## 2019-05-08 ENCOUNTER — Encounter (HOSPITAL_COMMUNITY): Payer: Self-pay

## 2019-05-08 ENCOUNTER — Inpatient Hospital Stay (HOSPITAL_COMMUNITY): Payer: Medicare Other

## 2019-05-08 ENCOUNTER — Inpatient Hospital Stay (HOSPITAL_COMMUNITY): Payer: Medicare Other | Attending: Hematology

## 2019-05-08 VITALS — BP 119/59 | HR 65 | Temp 97.4°F | Resp 18 | Wt 214.4 lb

## 2019-05-08 DIAGNOSIS — I2510 Atherosclerotic heart disease of native coronary artery without angina pectoris: Secondary | ICD-10-CM | POA: Insufficient documentation

## 2019-05-08 DIAGNOSIS — I129 Hypertensive chronic kidney disease with stage 1 through stage 4 chronic kidney disease, or unspecified chronic kidney disease: Secondary | ICD-10-CM | POA: Insufficient documentation

## 2019-05-08 DIAGNOSIS — C9000 Multiple myeloma not having achieved remission: Secondary | ICD-10-CM

## 2019-05-08 DIAGNOSIS — Z923 Personal history of irradiation: Secondary | ICD-10-CM | POA: Insufficient documentation

## 2019-05-08 DIAGNOSIS — K219 Gastro-esophageal reflux disease without esophagitis: Secondary | ICD-10-CM | POA: Insufficient documentation

## 2019-05-08 DIAGNOSIS — Z170 Estrogen receptor positive status [ER+]: Secondary | ICD-10-CM | POA: Insufficient documentation

## 2019-05-08 DIAGNOSIS — I700 Atherosclerosis of aorta: Secondary | ICD-10-CM | POA: Insufficient documentation

## 2019-05-08 DIAGNOSIS — C7951 Secondary malignant neoplasm of bone: Secondary | ICD-10-CM | POA: Diagnosis not present

## 2019-05-08 DIAGNOSIS — Z9481 Bone marrow transplant status: Secondary | ICD-10-CM | POA: Diagnosis not present

## 2019-05-08 DIAGNOSIS — G629 Polyneuropathy, unspecified: Secondary | ICD-10-CM | POA: Insufficient documentation

## 2019-05-08 DIAGNOSIS — Z7982 Long term (current) use of aspirin: Secondary | ICD-10-CM | POA: Diagnosis not present

## 2019-05-08 DIAGNOSIS — Z79899 Other long term (current) drug therapy: Secondary | ICD-10-CM | POA: Insufficient documentation

## 2019-05-08 DIAGNOSIS — D631 Anemia in chronic kidney disease: Secondary | ICD-10-CM | POA: Diagnosis not present

## 2019-05-08 DIAGNOSIS — Z5111 Encounter for antineoplastic chemotherapy: Secondary | ICD-10-CM | POA: Insufficient documentation

## 2019-05-08 DIAGNOSIS — F419 Anxiety disorder, unspecified: Secondary | ICD-10-CM | POA: Diagnosis not present

## 2019-05-08 DIAGNOSIS — N183 Chronic kidney disease, stage 3 (moderate): Secondary | ICD-10-CM | POA: Diagnosis not present

## 2019-05-08 DIAGNOSIS — Z9221 Personal history of antineoplastic chemotherapy: Secondary | ICD-10-CM | POA: Insufficient documentation

## 2019-05-08 DIAGNOSIS — C50922 Malignant neoplasm of unspecified site of left male breast: Secondary | ICD-10-CM | POA: Diagnosis not present

## 2019-05-08 DIAGNOSIS — Z79810 Long term (current) use of selective estrogen receptor modulators (SERMs): Secondary | ICD-10-CM | POA: Insufficient documentation

## 2019-05-08 LAB — COMPREHENSIVE METABOLIC PANEL
ALT: 16 U/L (ref 0–44)
AST: 17 U/L (ref 15–41)
Albumin: 3.7 g/dL (ref 3.5–5.0)
Alkaline Phosphatase: 27 U/L — ABNORMAL LOW (ref 38–126)
Anion gap: 9 (ref 5–15)
BUN: 23 mg/dL (ref 8–23)
CO2: 23 mmol/L (ref 22–32)
Calcium: 9.2 mg/dL (ref 8.9–10.3)
Chloride: 107 mmol/L (ref 98–111)
Creatinine, Ser: 1.81 mg/dL — ABNORMAL HIGH (ref 0.61–1.24)
GFR calc Af Amer: 43 mL/min — ABNORMAL LOW (ref >60)
GFR calc non Af Amer: 37 mL/min — ABNORMAL LOW (ref >60)
Glucose, Bld: 109 mg/dL — ABNORMAL HIGH (ref 70–99)
Potassium: 3.7 mmol/L (ref 3.5–5.1)
Sodium: 139 mmol/L (ref 135–145)
Total Bilirubin: 0.4 mg/dL (ref 0.3–1.2)
Total Protein: 5.9 g/dL — ABNORMAL LOW (ref 6.5–8.1)

## 2019-05-08 LAB — CBC WITH DIFFERENTIAL/PLATELET
Abs Immature Granulocytes: 0.02 10*3/uL (ref 0.00–0.07)
Basophils Absolute: 0.1 10*3/uL (ref 0.0–0.1)
Basophils Relative: 1 %
Eosinophils Absolute: 0.4 10*3/uL (ref 0.0–0.5)
Eosinophils Relative: 6 %
HCT: 30.5 % — ABNORMAL LOW (ref 39.0–52.0)
Hemoglobin: 10.1 g/dL — ABNORMAL LOW (ref 13.0–17.0)
Immature Granulocytes: 0 %
Lymphocytes Relative: 18 %
Lymphs Abs: 1.1 10*3/uL (ref 0.7–4.0)
MCH: 34.2 pg — ABNORMAL HIGH (ref 26.0–34.0)
MCHC: 33.1 g/dL (ref 30.0–36.0)
MCV: 103.4 fL — ABNORMAL HIGH (ref 80.0–100.0)
Monocytes Absolute: 0.6 10*3/uL (ref 0.1–1.0)
Monocytes Relative: 9 %
Neutro Abs: 4.1 10*3/uL (ref 1.7–7.7)
Neutrophils Relative %: 66 %
Platelets: 197 10*3/uL (ref 150–400)
RBC: 2.95 MIL/uL — ABNORMAL LOW (ref 4.22–5.81)
RDW: 14.3 % (ref 11.5–15.5)
WBC: 6.2 10*3/uL (ref 4.0–10.5)
nRBC: 0 % (ref 0.0–0.2)

## 2019-05-08 MED ORDER — SODIUM CHLORIDE 0.9 % IV SOLN
Freq: Once | INTRAVENOUS | Status: AC
  Administered 2019-05-08: 13:00:00 via INTRAVENOUS

## 2019-05-08 MED ORDER — SODIUM CHLORIDE 0.9 % IV SOLN
20.0000 mg | Freq: Once | INTRAVENOUS | Status: AC
  Administered 2019-05-08: 20 mg via INTRAVENOUS
  Filled 2019-05-08: qty 20

## 2019-05-08 MED ORDER — SODIUM CHLORIDE 0.9% FLUSH
10.0000 mL | INTRAVENOUS | Status: DC | PRN
  Administered 2019-05-08 (×2): 10 mL
  Filled 2019-05-08 (×2): qty 10

## 2019-05-08 MED ORDER — ACETAMINOPHEN 325 MG PO TABS
650.0000 mg | ORAL_TABLET | Freq: Once | ORAL | Status: AC
  Administered 2019-05-08: 650 mg via ORAL
  Filled 2019-05-08: qty 2

## 2019-05-08 MED ORDER — SODIUM CHLORIDE 0.9 % IV SOLN
Freq: Once | INTRAVENOUS | Status: AC
  Administered 2019-05-08: 15:00:00 via INTRAVENOUS

## 2019-05-08 MED ORDER — DEXTROSE 5 % IV SOLN
34.0000 mg/m2 | Freq: Once | INTRAVENOUS | Status: AC
  Administered 2019-05-08: 70 mg via INTRAVENOUS
  Filled 2019-05-08: qty 30

## 2019-05-08 MED ORDER — HEPARIN SOD (PORK) LOCK FLUSH 100 UNIT/ML IV SOLN
500.0000 [IU] | Freq: Once | INTRAVENOUS | Status: AC | PRN
  Administered 2019-05-08: 500 [IU]

## 2019-05-08 NOTE — Patient Outreach (Addendum)
[facility]) Care Management  05/08/2019  Johnny Lucas 05/09/1946 [id number]  This encounter was created in error - please disregard.  Joylene Draft, RN, [place] Management Coordinator  [phone]- Mobile [phone]- Toll Free Main Office

## 2019-05-08 NOTE — Patient Instructions (Signed)
[facility] Discharge Instructions for Patients Receiving Chemotherapy   Beginning January 23rd 2017 lab work for the [facility] will be done in the  Main lab at [facility] on 1st floor. If you have a lab appointment with the [facility] please come in thru the  Main Entrance and check in at the main information desk   Today you received the following chemotherapy agents Kyprolis. Follow-up as scheduled. Call clinic for any questions or concerns  To help prevent nausea and vomiting after your treatment, we encourage you to take your nausea medication   If you develop nausea and vomiting, or diarrhea that is not controlled by your medication, call the clinic.  The clinic phone number is [phone]. Office hours are Monday-Friday 8:30am-5:00pm.  BELOW ARE SYMPTOMS THAT SHOULD BE REPORTED IMMEDIATELY:  *FEVER GREATER THAN 101.0 F  *CHILLS WITH OR WITHOUT FEVER  NAUSEA AND VOMITING THAT IS NOT CONTROLLED WITH YOUR NAUSEA MEDICATION  *UNUSUAL SHORTNESS OF BREATH  *UNUSUAL BRUISING OR BLEEDING  TENDERNESS IN MOUTH AND THROAT WITH OR WITHOUT PRESENCE OF ULCERS  *URINARY PROBLEMS  *BOWEL PROBLEMS  UNUSUAL RASH Items with * indicate a potential emergency and should be followed up as soon as possible. If you have an emergency after office hours please contact your primary care physician or go to the nearest emergency department.  Please call the clinic during office hours if you have any questions or concerns.   You may also contact the Patient Navigator at [phone] should you have any questions or need assistance in obtaining follow up care.      Resources For Cancer Patients and their Caregivers ? American Cancer Society: Can assist with transportation, wigs, general needs, runs Look Good Feel Better.        9400540644 ? Cancer Care: Provides financial assistance, online support groups, medication/co-pay assistance.  1-800-813-HOPE  (412)880-5335) ? [place] Assists [place] cancer patients and their families through emotional , educational and financial support.  (918) 650-2228 ? [place] DSS Where to apply for food stamps, Medicaid and utility assistance. 934 374 7147 ? [facility]: Transportation to medical appointments. 281-783-8362 ? Social Security Administration: May apply for disability if have a Stage IV cancer. ([phone] 433 0389 ? [employer], Disability and Transit Services: Assists with nutrition, care and transit needs. 704-882-9348

## 2019-05-08 NOTE — Progress Notes (Signed)
Treatment given today per MD orders. Tolerated infusion without adverse affects. Vital signs stable. No complaints at this time. Discharged from clinic ambulatory. F/U with [facility] as scheduled.   

## 2019-05-08 NOTE — Progress Notes (Signed)
[place] with Dr. Delton Coombes and pt approved for Kyprolis infusion today per MD

## 2019-05-08 NOTE — Progress Notes (Signed)
05/08/19  Cannot receive Procrit today with Hgb 10.1 due to also receiving chemotherapy and does not meet parameters.   Henreitta Leber, PharmD

## 2019-05-12 ENCOUNTER — Other Ambulatory Visit: Payer: Self-pay

## 2019-05-12 ENCOUNTER — Inpatient Hospital Stay (HOSPITAL_COMMUNITY): Payer: Medicare Other

## 2019-05-12 ENCOUNTER — Encounter (HOSPITAL_COMMUNITY): Payer: Self-pay

## 2019-05-12 VITALS — BP 121/62 | HR 69 | Temp 97.8°F | Resp 18

## 2019-05-12 DIAGNOSIS — C7951 Secondary malignant neoplasm of bone: Secondary | ICD-10-CM | POA: Diagnosis not present

## 2019-05-12 DIAGNOSIS — Z79810 Long term (current) use of selective estrogen receptor modulators (SERMs): Secondary | ICD-10-CM | POA: Diagnosis not present

## 2019-05-12 DIAGNOSIS — C9000 Multiple myeloma not having achieved remission: Secondary | ICD-10-CM

## 2019-05-12 DIAGNOSIS — Z170 Estrogen receptor positive status [ER+]: Secondary | ICD-10-CM | POA: Diagnosis not present

## 2019-05-12 DIAGNOSIS — C50922 Malignant neoplasm of unspecified site of left male breast: Secondary | ICD-10-CM | POA: Diagnosis not present

## 2019-05-12 DIAGNOSIS — C50122 Malignant neoplasm of central portion of left male breast: Secondary | ICD-10-CM

## 2019-05-12 DIAGNOSIS — Z5111 Encounter for antineoplastic chemotherapy: Secondary | ICD-10-CM | POA: Diagnosis not present

## 2019-05-12 MED ORDER — EPOETIN ALFA 20000 UNIT/ML IJ SOLN: 20000.0000 [IU] | Freq: Once | INTRAMUSCULAR | Status: DC

## 2019-05-12 MED ORDER — DENOSUMAB 120 MG/1.7ML ~~LOC~~ SOLN
120.0000 mg | Freq: Once | SUBCUTANEOUS | Status: AC
  Administered 2019-05-12: 120 mg via SUBCUTANEOUS

## 2019-05-12 MED ORDER — DENOSUMAB 120 MG/1.7ML ~~LOC~~ SOLN
SUBCUTANEOUS | Status: AC
  Filled 2019-05-12: qty 1.7

## 2019-05-12 NOTE — Patient Instructions (Signed)
[facility]  Discharge Instructions:   _______________________________________________________________  Thank you for choosing [facility] to provide your oncology and hematology care.  To afford each patient quality time with our providers, please arrive at least 15 minutes before your scheduled appointment.  You need to re-schedule your appointment if you arrive 10 or more minutes late.  We strive to give you quality time with our providers, and arriving late affects you and other patients whose appointments are after yours.  Also, if you no show three or more times for appointments you may be dismissed from the clinic.  Again, thank you for choosing [facility]. Our hope is that these requests will allow you access to exceptional care and in a timely manner. _______________________________________________________________  If you have questions after your visit, please contact our office at [phone] between the hours of 8:30 a.m. and 5:00 p.m. Voicemails left after 4:30 p.m. will not be returned until the following business day. _______________________________________________________________  For prescription refill requests, have your pharmacy contact our office. _______________________________________________________________  Recommendations made by the consultant and any test results will be sent to your referring physician. _______________________________________________________________ 

## 2019-05-12 NOTE — Progress Notes (Signed)
Hgb 10.1 with last lab.  Receiving Xgeva today.  Taking calcium as directed with no jaw, tooth, or leg pain.   No s/s of distress noted.   Ok to give Epogen 20,000 units today verbal order Dr. Delton Coombes. Reviewed hemoglobin and chemotherapy with ok to administer verbal order Dr. Delton Coombes.  Patient is receiving Epogen due to chronic kidney disease.   Reviewed with the patient the orders from Dr. Delton Coombes with explanation and the patient expressed concern over getting the shot due to not receiving the shot in the past if his hemoglobin was above 10.  The patient refused the shot and wants to discuss with his next follow up visit.  Message given to Dr. Delton Coombes.   Patient tolerated injection with no complaints voiced.  Site clean and dry with no bruising or swelling noted at site.  Band aid applied.  Vss with discharge and left ambulatory with no s/s of distress noted.

## 2019-05-23 ENCOUNTER — Encounter (HOSPITAL_COMMUNITY): Payer: Self-pay | Admitting: Hematology

## 2019-05-23 ENCOUNTER — Inpatient Hospital Stay (HOSPITAL_COMMUNITY): Payer: Medicare Other

## 2019-05-23 ENCOUNTER — Inpatient Hospital Stay (HOSPITAL_BASED_OUTPATIENT_CLINIC_OR_DEPARTMENT_OTHER): Payer: Medicare Other | Admitting: Hematology

## 2019-05-23 ENCOUNTER — Other Ambulatory Visit: Payer: Self-pay

## 2019-05-23 VITALS — BP 125/68 | HR 59 | Temp 98.5°F | Resp 18

## 2019-05-23 VITALS — BP 123/67 | HR 70 | Temp 98.5°F | Resp 18 | Wt 214.4 lb

## 2019-05-23 DIAGNOSIS — C7951 Secondary malignant neoplasm of bone: Secondary | ICD-10-CM | POA: Diagnosis not present

## 2019-05-23 DIAGNOSIS — Z7982 Long term (current) use of aspirin: Secondary | ICD-10-CM

## 2019-05-23 DIAGNOSIS — Z9221 Personal history of antineoplastic chemotherapy: Secondary | ICD-10-CM

## 2019-05-23 DIAGNOSIS — D631 Anemia in chronic kidney disease: Secondary | ICD-10-CM

## 2019-05-23 DIAGNOSIS — C50122 Malignant neoplasm of central portion of left male breast: Secondary | ICD-10-CM

## 2019-05-23 DIAGNOSIS — Z170 Estrogen receptor positive status [ER+]: Secondary | ICD-10-CM

## 2019-05-23 DIAGNOSIS — C9000 Multiple myeloma not having achieved remission: Secondary | ICD-10-CM

## 2019-05-23 DIAGNOSIS — Z923 Personal history of irradiation: Secondary | ICD-10-CM

## 2019-05-23 DIAGNOSIS — I700 Atherosclerosis of aorta: Secondary | ICD-10-CM | POA: Diagnosis not present

## 2019-05-23 DIAGNOSIS — C50922 Malignant neoplasm of unspecified site of left male breast: Secondary | ICD-10-CM | POA: Diagnosis not present

## 2019-05-23 DIAGNOSIS — Z9481 Bone marrow transplant status: Secondary | ICD-10-CM

## 2019-05-23 DIAGNOSIS — G629 Polyneuropathy, unspecified: Secondary | ICD-10-CM | POA: Diagnosis not present

## 2019-05-23 DIAGNOSIS — Z5111 Encounter for antineoplastic chemotherapy: Secondary | ICD-10-CM | POA: Diagnosis not present

## 2019-05-23 DIAGNOSIS — N183 Chronic kidney disease, stage 3 (moderate): Secondary | ICD-10-CM | POA: Diagnosis not present

## 2019-05-23 DIAGNOSIS — I129 Hypertensive chronic kidney disease with stage 1 through stage 4 chronic kidney disease, or unspecified chronic kidney disease: Secondary | ICD-10-CM

## 2019-05-23 DIAGNOSIS — Z79810 Long term (current) use of selective estrogen receptor modulators (SERMs): Secondary | ICD-10-CM | POA: Diagnosis not present

## 2019-05-23 DIAGNOSIS — K219 Gastro-esophageal reflux disease without esophagitis: Secondary | ICD-10-CM

## 2019-05-23 DIAGNOSIS — I2510 Atherosclerotic heart disease of native coronary artery without angina pectoris: Secondary | ICD-10-CM | POA: Diagnosis not present

## 2019-05-23 DIAGNOSIS — F419 Anxiety disorder, unspecified: Secondary | ICD-10-CM

## 2019-05-23 DIAGNOSIS — Z79899 Other long term (current) drug therapy: Secondary | ICD-10-CM

## 2019-05-23 LAB — CBC WITH DIFFERENTIAL/PLATELET
Abs Immature Granulocytes: 0.01 10*3/uL (ref 0.00–0.07)
Basophils Absolute: 0 10*3/uL (ref 0.0–0.1)
Basophils Relative: 1 %
Eosinophils Absolute: 0.2 10*3/uL (ref 0.0–0.5)
Eosinophils Relative: 4 %
HCT: 29.5 % — ABNORMAL LOW (ref 39.0–52.0)
Hemoglobin: 9.7 g/dL — ABNORMAL LOW (ref 13.0–17.0)
Immature Granulocytes: 0 %
Lymphocytes Relative: 18 %
Lymphs Abs: 1 10*3/uL (ref 0.7–4.0)
MCH: 34.6 pg — ABNORMAL HIGH (ref 26.0–34.0)
MCHC: 32.9 g/dL (ref 30.0–36.0)
MCV: 105.4 fL — ABNORMAL HIGH (ref 80.0–100.0)
Monocytes Absolute: 0.5 10*3/uL (ref 0.1–1.0)
Monocytes Relative: 9 %
Neutro Abs: 3.9 10*3/uL (ref 1.7–7.7)
Neutrophils Relative %: 68 %
Platelets: 171 10*3/uL (ref 150–400)
RBC: 2.8 MIL/uL — ABNORMAL LOW (ref 4.22–5.81)
RDW: 15.9 % — ABNORMAL HIGH (ref 11.5–15.5)
WBC: 5.7 10*3/uL (ref 4.0–10.5)
nRBC: 0 % (ref 0.0–0.2)

## 2019-05-23 LAB — COMPREHENSIVE METABOLIC PANEL
ALT: 15 U/L (ref 0–44)
AST: 15 U/L (ref 15–41)
Albumin: 3.6 g/dL (ref 3.5–5.0)
Alkaline Phosphatase: 25 U/L — ABNORMAL LOW (ref 38–126)
Anion gap: 7 (ref 5–15)
BUN: 19 mg/dL (ref 8–23)
CO2: 24 mmol/L (ref 22–32)
Calcium: 8.9 mg/dL (ref 8.9–10.3)
Chloride: 109 mmol/L (ref 98–111)
Creatinine, Ser: 1.76 mg/dL — ABNORMAL HIGH (ref 0.61–1.24)
GFR calc Af Amer: 44 mL/min — ABNORMAL LOW (ref >60)
GFR calc non Af Amer: 38 mL/min — ABNORMAL LOW (ref >60)
Glucose, Bld: 112 mg/dL — ABNORMAL HIGH (ref 70–99)
Potassium: 4 mmol/L (ref 3.5–5.1)
Sodium: 140 mmol/L (ref 135–145)
Total Bilirubin: 0.4 mg/dL (ref 0.3–1.2)
Total Protein: 5.8 g/dL — ABNORMAL LOW (ref 6.5–8.1)

## 2019-05-23 MED ORDER — ACETAMINOPHEN 325 MG PO TABS
650.0000 mg | ORAL_TABLET | Freq: Once | ORAL | Status: AC
  Administered 2019-05-23: 650 mg via ORAL
  Filled 2019-05-23: qty 2

## 2019-05-23 MED ORDER — SODIUM CHLORIDE 0.9% FLUSH
10.0000 mL | INTRAVENOUS | Status: DC | PRN
  Administered 2019-05-23: 10 mL
  Filled 2019-05-23: qty 10

## 2019-05-23 MED ORDER — SODIUM CHLORIDE 0.9 % IV SOLN
Freq: Once | INTRAVENOUS | Status: AC
  Administered 2019-05-23: 12:00:00 via INTRAVENOUS

## 2019-05-23 MED ORDER — SODIUM CHLORIDE 0.9 % IV SOLN
20.0000 mg | Freq: Once | INTRAVENOUS | Status: AC
  Administered 2019-05-23: 20 mg via INTRAVENOUS
  Filled 2019-05-23: qty 2

## 2019-05-23 MED ORDER — SODIUM CHLORIDE 0.9 % IV SOLN
Freq: Once | INTRAVENOUS | Status: AC
  Administered 2019-05-23: 15:00:00 via INTRAVENOUS

## 2019-05-23 MED ORDER — DEXTROSE 5 % IV SOLN
34.0000 mg/m2 | Freq: Once | INTRAVENOUS | Status: AC
  Administered 2019-05-23: 70 mg via INTRAVENOUS
  Filled 2019-05-23: qty 5

## 2019-05-23 MED ORDER — HEPARIN SOD (PORK) LOCK FLUSH 100 UNIT/ML IV SOLN
500.0000 [IU] | Freq: Once | INTRAVENOUS | Status: AC | PRN
  Administered 2019-05-23: 500 [IU]

## 2019-05-23 MED ORDER — EPOETIN ALFA 20000 UNIT/ML IJ SOLN
20000.0000 [IU] | Freq: Once | INTRAMUSCULAR | Status: AC
  Administered 2019-05-23: 20000 [IU] via SUBCUTANEOUS
  Filled 2019-05-23: qty 1

## 2019-05-23 NOTE — Progress Notes (Signed)
Johnny Lucas, [place] [address]   CLINIC:  Medical Oncology/Hematology  PCP:  Johnny Percy, MD [address] [place] [address] [phone]   REASON FOR VISIT:  Follow-up for Multiple Myeloma  CURRENT THERAPY: Kyprolis maintenance.  BRIEF ONCOLOGIC HISTORY:    Breast Lucas, male ([facility])   12/31/2009 Initial Biopsy    Biopsy of L breast     12/31/2009 Pathology Results    Invasive ductal carcinoma, ER/PR+, HER 2 negative    12/31/2009 Imaging    Ultrasound showing a 2.43 x 1.85 x 3 cm hypoechoic spiculated mass in the 12 o clock L breast retroareolar region    01/01/2010 -  Anti-estrogen oral therapy    Tamoxifen 20 mg daily    01/06/2010 Imaging    Bone scan abnormal uptake in the diaphysis of the R humerus, abnormal in the R third, fifth and sixth ribs, lesion also noted in the sternum.    02/03/2010 Surgery    Rod placement and fixation of R humerus by Dr. Amedeo Lucas    02/05/2010 - 02/18/2010 Radiation Therapy    30Gy in 10 fractions of 3 Gy per fraction to R pathologic fracture    03/11/2010 -  Chemotherapy    Denosumab monthly, now every 3 months. Started at [facility]     06/09/2016 Imaging    Three hypermetabolic osseous lesions in the sternum, left ilium and right ilium, as discussed above, likely represent osseous metastases. At this time, these are not recognizable on the CT images. 2. No extra skeletal metastatic disease identified in the neck, chest, abdomen or pelvis.    10/13/2016 Progression    PET shows various new and enlarging osseous metastatic lesions with no definite extra osseous metastatic disease currently identified.     12/31/2016 Progression    1. Multifocal hypermetabolic osseous metastases throughout the axial and proximal appendicular skeleton, which are increased in size, number and metabolism since 10/13/2016 PET-CT. 2. 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 3. No additional sites of hypermetabolic metastatic disease. 4. Chronic right mastoid sinusitis. 5. Aortic atherosclerosis.  One vessel coronary atherosclerosis.     Multiple myeloma ([place])   02/12/2017 Bone Marrow Biopsy    The marrow was variably cellular with large peritrabecular aggregates of kappa restricted plasma cells (66% by aspirate, 30% by Cd138). Cytogenetics +11.     03/01/2017 - 06/29/2017 Chemotherapy    RVD     05/26/2017 Bone Marrow Biopsy    Performed at [facility]:  Plasma cell myeloma in a 30% cellularmarrow with decreased trilineage hematopoiesis and 42% kappalight chain restricted plasma cells on the aspirate smears andlarge aggregates on the core biopsy.     07/12/2017 - 09/01/2017 Chemotherapy    3 cycles of carfizolmib/cyclophosphamide/dexamethasone      10/08/2017 Bone Marrow Transplant    [facility]otransplant at [facility]      Lucas STAGING: Lucas Staging Multiple myeloma [facility]) Staging form: Plasma Cell Myeloma and Plasma Cell Disorders, AJCC 8th Edition - Clinical stage from 05/03/2017: Beta-2-microglobulin (mg/L): 3.5, Albumin (g/dL): 3.4, ISS: Stage II, High-risk cytogenetics: Absent, LDH: Not assessed - Signed by Johnny Cancer, PA-C on 05/03/2017    INTERVAL HISTORY:  [name] 73 y.o. male presents today for follow up. Reports overall doing well. He denies any significant fatigue.  He is currently receiving Kyprolis and is tolerating well.  Denies any SOB or chest pain.  Denies any fevers, chills, night sweats. Appetite is stable. Reports participating in adequate physical activity daily. States he is ready for treatment today.  Reports continuing acyclovir.  His neuropathy has been stable.  Appetite is 100%.  Energy levels are 50%.   REVIEW OF SYSTEMS:  Review of Systems  Constitutional: Negative.   HENT:  Negative.   Eyes: Negative.   Respiratory:  Negative.   Cardiovascular: Negative.   Gastrointestinal: Negative.   Endocrine: Negative.   Genitourinary: Negative.    Musculoskeletal: Negative.   Skin: Negative.   Neurological: Negative.   Hematological: Negative.   Psychiatric/Behavioral: Negative.      PAST MEDICAL/SURGICAL HISTORY:  Past Medical History:  Diagnosis Date  . Anxiety   . Bone metastases ([name] 09/10/2016  . Breast Lucas ([name] 2011   Stave IV breast Lucas; radiation and tamoxifen  . Breast Lucas, male ([name]    Stave IV breast Lucas; radiation and tamoxifen  Overview:  Left breast ca with mets bone  Overview:  METS TO BONE  . GERD (gastroesophageal reflux disease)   . Hypertension   . Macular degeneration   . Multiple myeloma ([name] 02/18/2017  . Peripheral neuropathy    Past Surgical History:  Procedure Laterality Date  . BACK SURGERY    . CATARACT EXTRACTION W/PHACO Left 02/03/2019   Procedure: CATARACT EXTRACTION PHACO AND INTRAOCULAR LENS PLACEMENT (IOC);  Surgeon: Wrzosek, James, MD;  Location: [facility];  Service: Ophthalmology;  Laterality: Left;  CDE: 15.89  . HERNIA REPAIR    . PORTACATH PLACEMENT Left 06/10/2018   Procedure: INSERTION PORT-A-CATH;  Surgeon: Bridges, Lindsay C, MD;  Location: [facility];  Service: General;  Laterality: Left;  . right arm surgery       SOCIAL HISTORY:  Social History   Socioeconomic History  . Marital status: Married    Spouse name: Not on file  . Number of children: 3  . Years of education: Not on file  . Highest education level: Not on file  Occupational History  . Not on file  Social Needs  . Financial resource strain: Not on file  . Food insecurity:    Worry: Not on file    Inability: Not on file  . Transportation needs:    Medical: Not on file    Non-medical: Not on file  Tobacco Use  . Smoking status: Never Smoker  . Smokeless tobacco: Former User  Substance and Sexual Activity  . Alcohol use: Yes    Alcohol/week: 24.0 standard drinks    Types:  24 Cans of beer per week  . Drug use: No  . Sexual activity: Not on file  Lifestyle  . Physical activity:    Days per week: Not on file    Minutes per session: Not on file  . Stress: Not on file  Relationships  . Social connections:    Talks on phone: Not on file    Gets together: Not on file    Attends religious service: Not on file    Active member of club or organization: Not on file    Attends meetings of clubs or organizations: Not on file    Relationship status: Not on file  . Intimate partner violence:    Fear of current or ex partner: Not on file    Emotionally abused: Not on file    Physically abused: Not on file    Forced sexual activity: Not on file  Other Topics Concern  .   Not on file  Social History Narrative  . Not on file    FAMILY HISTORY:  Family History  Problem Relation Age of Onset  . Stroke Mother   . Lucas Maternal Aunt        Lucas NOS; died in her 50s  . Lung Lucas Maternal Uncle        smoker    CURRENT MEDICATIONS:  Outpatient Encounter Medications as of 05/23/2019  Medication Sig Note  . acyclovir (ZOVIRAX) 800 MG tablet Take 1 tablet (800 mg total) by mouth 2 (two) times daily.   . ALPRAZolam (XANAX) 0.5 MG tablet Take 1 tablet (0.5 mg total) by mouth at bedtime as needed for anxiety or sleep. TAKE 1 TABLET BY MOUTH AT BEDTIME AS NEEDED FOR ANXIETY   . amLODipine (NORVASC) 5 MG tablet Take 1 tablet (5 mg total) by mouth daily.   . aspirin EC 81 MG tablet Take 81 mg by mouth daily.   . calcium-vitamin D (OSCAL WITH D) 500-200 MG-UNIT TABS tablet Take 2 tablets by mouth daily. (Patient taking differently: Take 1 tablet by mouth 2 (two) times daily. )   . Cholecalciferol (VITAMIN D) 50 MCG (2000 UT) CAPS Take 4,000 Units by mouth 2 (two) times daily.   . denosumab (XGEVA) 120 MG/1.7ML SOLN injection Inject 120 mg into the skin once. 01/18/2019: GIVEN AT HOSPITAL   . folic acid (FOLVITE) 1 MG tablet Take 1 mg by mouth daily.    . gabapentin  (NEURONTIN) 300 MG capsule Take 1 capsule (300 mg total) by mouth 2 (two) times daily. Take two times a day, then increase to three times a day in a few weeks. (Patient taking differently: Take 300 mg by mouth 2 (two) times daily. )   . hydrALAZINE (APRESOLINE) 50 MG tablet Take 1 tablet (50 mg total) by mouth every 8 (eight) hours. (Patient taking differently: Take 50 mg by mouth 2 (two) times daily. )   . loratadine (CLARITIN) 10 MG tablet Take 1 tablet (10 mg total) by mouth daily.   . metoprolol succinate (TOPROL-XL) 100 MG 24 hr tablet Take 100 mg by mouth daily.    . Multiple Vitamin (MULTIVITAMIN WITH MINERALS) TABS tablet Take 1 tablet by mouth daily.   . ondansetron (ZOFRAN) 8 MG tablet TAKE ONE TABLET BY MOUTH EVERY 8 HOURS AS NEEDED   . ondansetron (ZOFRAN) 8 MG tablet Take 1 tablet (8 mg total) by mouth every 8 (eight) hours as needed for nausea or vomiting.   . pantoprazole (PROTONIX) 40 MG tablet Take 40 mg by mouth every other day.    . polyethylene glycol (MIRALAX / GLYCOLAX) packet Take 17 g by mouth daily as needed for mild constipation.   . prochlorperazine (COMPAZINE) 10 MG tablet Take 1 tablet (10 mg total) by mouth every 6 (six) hours as needed for nausea or vomiting.   . pseudoephedrine-guaifenesin (MUCINEX D) 60-600 MG 12 hr tablet Take 1 tablet by mouth 2 (two) times daily as needed for congestion.    . tamoxifen (NOLVADEX) 20 MG tablet Take 1 tablet (20 mg total) by mouth daily.   . vitamin B-12 (CYANOCOBALAMIN) 1000 MCG tablet Take 1,000 mcg by mouth daily.    Facility-Administered Encounter Medications as of 05/23/2019  Medication  . heparin lock flush 100 unit/mL  . sodium chloride flush (NS) 0.9 % injection 10 mL  . sodium chloride flush (NS) 0.9 % injection 10 mL    ALLERGIES:  Allergies  Allergen Reactions  .   Cefepime     Suspected severe thrombocytopenia is a result of cefepime induced antigen platelet destruction     PHYSICAL EXAM:  ECOG Performance  status: 1  Vitals:   05/23/19 1126  BP: 123/67  Pulse: 70  Resp: 18  Temp: 98.5 F (36.9 C)  SpO2: 100%   Filed Weights   05/23/19 1126  Weight: 214 lb 6.4 oz (97.3 kg)    Physical Exam Constitutional:      Appearance: Normal appearance. He is normal weight.  HENT:     Head: Normocephalic.     Nose: Nose normal.     Mouth/Throat:     Mouth: Mucous membranes are moist.     Pharynx: Oropharynx is clear.  Eyes:     Extraocular Movements: Extraocular movements intact.     Conjunctiva/sclera: Conjunctivae normal.  Neck:     Musculoskeletal: Normal range of motion.  Cardiovascular:     Rate and Rhythm: Normal rate and regular rhythm.  Pulmonary:     Effort: Pulmonary effort is normal.     Breath sounds: Normal breath sounds.  Abdominal:     General: Bowel sounds are normal.     Palpations: Abdomen is soft.  Musculoskeletal:     Right lower leg: Edema present.     Left lower leg: Edema present.  Skin:    General: Skin is warm and dry.  Neurological:     General: No focal deficit present.     Mental Status: He is oriented to person, place, and time.  Psychiatric:        Mood and Affect: Mood normal.        Behavior: Behavior normal.        Thought Content: Thought content normal.        Judgment: Judgment normal.      LABORATORY DATA:  I have reviewed the labs as listed.  CBC    Component Value Date/Time   WBC 5.7 05/23/2019 1031   RBC 2.80 (L) 05/23/2019 1031   HGB 9.7 (L) 05/23/2019 1031   HCT 29.5 (L) 05/23/2019 1031   PLT 171 05/23/2019 1031   MCV 105.4 (H) 05/23/2019 1031   MCH 34.6 (H) 05/23/2019 1031   MCHC 32.9 05/23/2019 1031   RDW 15.9 (H) 05/23/2019 1031   LYMPHSABS 1.0 05/23/2019 1031   MONOABS 0.5 05/23/2019 1031   EOSABS 0.2 05/23/2019 1031   BASOSABS 0.0 05/23/2019 1031   CMP Latest Ref Rng & Units 05/23/2019 05/08/2019 04/24/2019  Glucose 70 - 99 mg/dL 112(H) 109(H) 100(H)  BUN 8 - 23 mg/dL 19 23 25(H)  Creatinine 0.61 - 1.24 mg/dL  1.76(H) 1.81(H) 1.91(H)  Sodium 135 - 145 mmol/L 140 139 137  Potassium 3.5 - 5.1 mmol/L 4.0 3.7 4.1  Chloride 98 - 111 mmol/L 109 107 105  CO2 22 - 32 mmol/L 24 23 24  Calcium 8.9 - 10.3 mg/dL 8.9 9.2 9.0  Total Protein 6.5 - 8.1 g/dL 5.8(L) 5.9(L) 5.8(L)  Total Bilirubin 0.3 - 1.2 mg/dL 0.4 0.4 0.3  Alkaline Phos 38 - 126 U/L 25(L) 27(L) 27(L)  AST 15 - 41 U/L 15 17 15  ALT 0 - 44 U/L 15 16 15       DIAGNOSTIC IMAGING:  I have independently reviewed the scans and discussed with the patient.     ASSESSMENT & PLAN:   Multiple myeloma ([name] 1.  Stage II IgG kappa multiple myeloma (Dx February 2018): -RVD from 03/01/2017 through 06/29/2017 -Chemotherapy changed to 3 cycles   of CCyD from 07/12/2017-04/2017 to obtain deeper response - Status post auto stem cell transplant on 10/08/2017 -Stable M spike after transplant, started on CCyD, cycle 1 on 02/14/2018, cycle 2 on 03/14/2018 -His M spike after 2 cycles was undetectable, when measured at [facility] last week.  He was recommended to have maintenance with carfilzomib 70 mg/m square on days 1 and 15 every 28 days by Dr. Norma Fredrickson. -He received his first carfilzomib 74m/m2 on 04/20/2018, developed fever with chills 3 hours later with weakness in the extremities.  He was admitted to the hospital, received 1 unit of blood transfusion.  Blood cultures turned out to be negative.  He was febrile after hospitalization and was sent home. - Subsequently he tolerated carfilzomib and smaller doses very well.  We have increased his doses to 60 mg/m on 09/01/2018 and he tolerated 4 doses very well with the addition of dexamethasone. - Carfilzomib dose was increased to 70 mg per metered square on 10/27/2018.   -He was hospitalized on 11/29/2018 through 12/05/2018 with bronchitis, positive for parainfluenza, acute renal failure and severe thrombocytopenia.  His platelet count went down to 9.  We have given IVIG 1 g/kg x 2 doses.  Platelet count has slowly  improved. - Review of literature showed carfilzomib can cause dose-related thrombotic microangiopathy. -Carfilzomib restarted at reduced dose of 35 mg per metered square on 01/05/2019.   - Myeloma panel on 04/24/2019 shows M spike of 0.1 g/dL.  Free light chain ratio has increased to 3.75.  Previously 2.4.  Kappa light chains were 22.1, previously 20.4. -We will continue carfilzomib at the same dose.  We will reevaluate him in 4 weeks.  2.  Metastatic left breast Lucas to the bones: -Diagnosed in 2011.  He status post lumpectomy.  He will continue tamoxifen.  3.  Peripheral neuropathy: -His neuropathy has been stable.  He will continue gabapentin 300 mg twice daily.  4.  ID prophylaxis: -He will continue acyclovir 400 mg twice daily.  5.  Bone strengthening: -We will continue monthly denosumab.  He will also continue calcium supplements.  6.  Macrocytic anemia: -Etiology is stage III CKD.  Last Feraheme was on 04/27/2019. - Procrit 20,000 units every 2 weeks was started on 02/10/2019.  Did not receive any Procrit in the last 2 months.     Total time spent is 25 minutes with more than 50% of the time spent face-to-face discussing treatment plan and coordination of care.    Orders placed this encounter:  Orders Placed This Encounter  Procedures  . CBC with Differential  . Comprehensive metabolic panel  . Multiple Myeloma Panel (SPEP&IFE w/QIG)  . Kappa/lambda light chains      SDerek Jack [phone]

## 2019-05-23 NOTE — Assessment & Plan Note (Addendum)
1.  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  He was recommended to have maintenance with carfilzomib 70 mg/m square on days 1 and 15 every 28 days by Dr. Norma Fredrickson. -He received his first carfilzomib 51m/m2 on 04/20/2018, developed fever with chills 3 hours later with weakness in the extremities.  He was admitted to the hospital, received 1 unit of blood transfusion.  Blood cultures turned out to be negative.  He was febrile after hospitalization and was sent home. - Subsequently he tolerated carfilzomib and smaller doses very well.  We have increased his doses to 60 mg/m on 09/01/2018 and he tolerated 4 doses very well with the addition of dexamethasone. - Carfilzomib dose was increased to 70 mg per metered square on 10/27/2018.   -He was hospitalized on 11/29/2018 through 12/05/2018 with bronchitis, positive for parainfluenza, acute renal failure and severe thrombocytopenia.  His platelet count went down to 9.  We have given IVIG 1 g/kg x 2 doses.  Platelet count has slowly improved. - Review of literature showed carfilzomib can cause dose-related thrombotic microangiopathy. -Carfilzomib restarted at reduced dose of 35 mg per metered square on 01/05/2019.   - Myeloma panel on 04/24/2019 shows M spike of 0.1 g/dL.  Free light chain ratio has increased to 3.75.  Previously 2.4.  Kappa light chains were 22.1, previously 20.4. -We will continue carfilzomib at the same dose.  We will reevaluate him in 4 weeks with repeat myeloma labs.  2.  Metastatic left breast cancer to the bones: -Diagnosed in 2011.  He status post lumpectomy.  He will  continue tamoxifen.  3.  Peripheral neuropathy: -His neuropathy has been stable.  He will continue gabapentin 300 mg twice daily.  4.  ID prophylaxis: -He will continue acyclovir 400 mg twice daily.  5.  Bone strengthening: -We will continue monthly denosumab.  He will also continue calcium supplements.  6.  Macrocytic anemia: -Etiology is stage III CKD.  Last Feraheme was on 04/27/2019. - Procrit 20,000 units every 2 weeks was started on 02/10/2019.  Did not receive any Procrit in the last 2 months.

## 2019-05-23 NOTE — Progress Notes (Signed)
Treatment given today per MD orders. Tolerated infusion without adverse affects. Vital signs stable. No complaints at this time. Discharged from clinic ambulatory. F/U with [facility] as scheduled.   

## 2019-05-25 ENCOUNTER — Telehealth (HOSPITAL_COMMUNITY): Payer: Self-pay | Admitting: *Deleted

## 2019-05-26 NOTE — Telephone Encounter (Signed)
I talked with patient and advised that the acyclovir he takes is to prevent shingles.  He appreciates the clarification.

## 2019-05-28 ENCOUNTER — Other Ambulatory Visit (HOSPITAL_COMMUNITY): Payer: Self-pay | Admitting: Nurse Practitioner

## 2019-05-28 DIAGNOSIS — C9000 Multiple myeloma not having achieved remission: Secondary | ICD-10-CM

## 2019-06-01 ENCOUNTER — Other Ambulatory Visit (HOSPITAL_COMMUNITY): Payer: Self-pay | Admitting: Hematology

## 2019-06-06 ENCOUNTER — Other Ambulatory Visit: Payer: Self-pay

## 2019-06-06 ENCOUNTER — Inpatient Hospital Stay (HOSPITAL_COMMUNITY): Payer: Medicare Other

## 2019-06-06 ENCOUNTER — Inpatient Hospital Stay (HOSPITAL_COMMUNITY): Payer: Medicare Other | Attending: Hematology

## 2019-06-06 ENCOUNTER — Encounter (HOSPITAL_COMMUNITY): Payer: Self-pay

## 2019-06-06 VITALS — BP 119/57 | HR 61 | Temp 97.7°F | Resp 18 | Wt 212.6 lb

## 2019-06-06 DIAGNOSIS — I129 Hypertensive chronic kidney disease with stage 1 through stage 4 chronic kidney disease, or unspecified chronic kidney disease: Secondary | ICD-10-CM | POA: Diagnosis not present

## 2019-06-06 DIAGNOSIS — G629 Polyneuropathy, unspecified: Secondary | ICD-10-CM | POA: Insufficient documentation

## 2019-06-06 DIAGNOSIS — C9000 Multiple myeloma not having achieved remission: Secondary | ICD-10-CM

## 2019-06-06 DIAGNOSIS — R42 Dizziness and giddiness: Secondary | ICD-10-CM | POA: Diagnosis not present

## 2019-06-06 DIAGNOSIS — Z79810 Long term (current) use of selective estrogen receptor modulators (SERMs): Secondary | ICD-10-CM | POA: Insufficient documentation

## 2019-06-06 DIAGNOSIS — C7951 Secondary malignant neoplasm of bone: Secondary | ICD-10-CM

## 2019-06-06 DIAGNOSIS — Z7982 Long term (current) use of aspirin: Secondary | ICD-10-CM | POA: Diagnosis not present

## 2019-06-06 DIAGNOSIS — D539 Nutritional anemia, unspecified: Secondary | ICD-10-CM | POA: Diagnosis not present

## 2019-06-06 DIAGNOSIS — Z5111 Encounter for antineoplastic chemotherapy: Secondary | ICD-10-CM | POA: Diagnosis not present

## 2019-06-06 DIAGNOSIS — Z170 Estrogen receptor positive status [ER+]: Secondary | ICD-10-CM | POA: Insufficient documentation

## 2019-06-06 DIAGNOSIS — K219 Gastro-esophageal reflux disease without esophagitis: Secondary | ICD-10-CM | POA: Insufficient documentation

## 2019-06-06 DIAGNOSIS — D696 Thrombocytopenia, unspecified: Secondary | ICD-10-CM | POA: Diagnosis not present

## 2019-06-06 DIAGNOSIS — C50222 Malignant neoplasm of upper-inner quadrant of left male breast: Secondary | ICD-10-CM | POA: Insufficient documentation

## 2019-06-06 DIAGNOSIS — C50122 Malignant neoplasm of central portion of left male breast: Secondary | ICD-10-CM

## 2019-06-06 DIAGNOSIS — N183 Chronic kidney disease, stage 3 (moderate): Secondary | ICD-10-CM | POA: Diagnosis not present

## 2019-06-06 DIAGNOSIS — Z923 Personal history of irradiation: Secondary | ICD-10-CM | POA: Insufficient documentation

## 2019-06-06 DIAGNOSIS — Z79899 Other long term (current) drug therapy: Secondary | ICD-10-CM | POA: Insufficient documentation

## 2019-06-06 DIAGNOSIS — D631 Anemia in chronic kidney disease: Secondary | ICD-10-CM | POA: Insufficient documentation

## 2019-06-06 DIAGNOSIS — F419 Anxiety disorder, unspecified: Secondary | ICD-10-CM | POA: Diagnosis not present

## 2019-06-06 LAB — CBC WITH DIFFERENTIAL/PLATELET
Abs Immature Granulocytes: 0.01 10*3/uL (ref 0.00–0.07)
Basophils Absolute: 0.1 10*3/uL (ref 0.0–0.1)
Basophils Relative: 1 %
Eosinophils Absolute: 0.3 10*3/uL (ref 0.0–0.5)
Eosinophils Relative: 5 %
HCT: 28.9 % — ABNORMAL LOW (ref 39.0–52.0)
Hemoglobin: 9.5 g/dL — ABNORMAL LOW (ref 13.0–17.0)
Immature Granulocytes: 0 %
Lymphocytes Relative: 17 %
Lymphs Abs: 1 10*3/uL (ref 0.7–4.0)
MCH: 36 pg — ABNORMAL HIGH (ref 26.0–34.0)
MCHC: 32.9 g/dL (ref 30.0–36.0)
MCV: 109.5 fL — ABNORMAL HIGH (ref 80.0–100.0)
Monocytes Absolute: 0.7 10*3/uL (ref 0.1–1.0)
Monocytes Relative: 11 %
Neutro Abs: 3.7 10*3/uL (ref 1.7–7.7)
Neutrophils Relative %: 66 %
Platelets: 203 10*3/uL (ref 150–400)
RBC: 2.64 MIL/uL — ABNORMAL LOW (ref 4.22–5.81)
RDW: 16.7 % — ABNORMAL HIGH (ref 11.5–15.5)
WBC: 5.7 10*3/uL (ref 4.0–10.5)
nRBC: 0 % (ref 0.0–0.2)

## 2019-06-06 LAB — COMPREHENSIVE METABOLIC PANEL
ALT: 16 U/L (ref 0–44)
AST: 18 U/L (ref 15–41)
Albumin: 3.8 g/dL (ref 3.5–5.0)
Alkaline Phosphatase: 26 U/L — ABNORMAL LOW (ref 38–126)
Anion gap: 10 (ref 5–15)
BUN: 22 mg/dL (ref 8–23)
CO2: 22 mmol/L (ref 22–32)
Calcium: 8.9 mg/dL (ref 8.9–10.3)
Chloride: 108 mmol/L (ref 98–111)
Creatinine, Ser: 1.75 mg/dL — ABNORMAL HIGH (ref 0.61–1.24)
GFR calc Af Amer: 44 mL/min — ABNORMAL LOW (ref >60)
GFR calc non Af Amer: 38 mL/min — ABNORMAL LOW (ref >60)
Glucose, Bld: 96 mg/dL (ref 70–99)
Potassium: 4.5 mmol/L (ref 3.5–5.1)
Sodium: 140 mmol/L (ref 135–145)
Total Bilirubin: 0.6 mg/dL (ref 0.3–1.2)
Total Protein: 6 g/dL — ABNORMAL LOW (ref 6.5–8.1)

## 2019-06-06 MED ORDER — HEPARIN SOD (PORK) LOCK FLUSH 100 UNIT/ML IV SOLN
500.0000 [IU] | Freq: Once | INTRAVENOUS | Status: AC | PRN
  Administered 2019-06-06: 500 [IU]

## 2019-06-06 MED ORDER — SODIUM CHLORIDE 0.9% FLUSH
10.0000 mL | INTRAVENOUS | Status: DC | PRN
  Administered 2019-06-06: 10 mL
  Filled 2019-06-06: qty 10

## 2019-06-06 MED ORDER — SODIUM CHLORIDE 0.9 % IV SOLN
Freq: Once | INTRAVENOUS | Status: AC
  Administered 2019-06-06: 13:00:00 via INTRAVENOUS

## 2019-06-06 MED ORDER — SODIUM CHLORIDE 0.9 % IV SOLN
Freq: Once | INTRAVENOUS | Status: AC
  Administered 2019-06-06: 15:00:00 via INTRAVENOUS

## 2019-06-06 MED ORDER — EPOETIN ALFA 20000 UNIT/ML IJ SOLN
20000.0000 [IU] | Freq: Once | INTRAMUSCULAR | Status: AC
  Administered 2019-06-06: 20000 [IU] via SUBCUTANEOUS
  Filled 2019-06-06: qty 1

## 2019-06-06 MED ORDER — SODIUM CHLORIDE 0.9 % IV SOLN
20.0000 mg | Freq: Once | INTRAVENOUS | Status: AC
  Administered 2019-06-06: 20 mg via INTRAVENOUS
  Filled 2019-06-06: qty 20

## 2019-06-06 MED ORDER — ACETAMINOPHEN 325 MG PO TABS
650.0000 mg | ORAL_TABLET | Freq: Once | ORAL | Status: AC
  Administered 2019-06-06: 13:00:00 650 mg via ORAL
  Filled 2019-06-06: qty 2

## 2019-06-06 MED ORDER — DEXTROSE 5 % IV SOLN
34.0000 mg/m2 | Freq: Once | INTRAVENOUS | Status: AC
  Administered 2019-06-06: 14:00:00 70 mg via INTRAVENOUS
  Filled 2019-06-06: qty 5

## 2019-06-06 NOTE — Progress Notes (Signed)
Pt presents today for treatment only. VSS. Labs drawn on 06/05/19. Creat 1.75. Message sent to [facility] LPN . Creat decreased since the last visit. MD aware.

## 2019-06-06 NOTE — Progress Notes (Signed)
06/06/19  Ok to treat today with creatinine of 1.75 and maintain today's dose at 70 mg despite weight increase.  T.O. Dr Rhys Martini, PharmD

## 2019-06-06 NOTE — Progress Notes (Signed)
Treatment given per orders. Patient tolerated it well without problems. Vitals stable and discharged home from clinic ambulatory. Follow up as scheduled.  

## 2019-06-06 NOTE — Patient Instructions (Signed)
[facility] Discharge Instructions for Patients Receiving Chemotherapy  Today you received the following chemotherapy agents   To help prevent nausea and vomiting after your treatment, we encourage you to take your nausea medication   If you develop nausea and vomiting that is not controlled by your nausea medication, call the clinic.   BELOW ARE SYMPTOMS THAT SHOULD BE REPORTED IMMEDIATELY:  *FEVER GREATER THAN 100.5 F  *CHILLS WITH OR WITHOUT FEVER  NAUSEA AND VOMITING THAT IS NOT CONTROLLED WITH YOUR NAUSEA MEDICATION  *UNUSUAL SHORTNESS OF BREATH  *UNUSUAL BRUISING OR BLEEDING  TENDERNESS IN MOUTH AND THROAT WITH OR WITHOUT PRESENCE OF ULCERS  *URINARY PROBLEMS  *BOWEL PROBLEMS  UNUSUAL RASH Items with * indicate a potential emergency and should be followed up as soon as possible.  Feel free to call the clinic should you have any questions or concerns. The clinic phone number is [phone].  Please show the CHEMO ALERT CARD at check-in to the Emergency Department and triage nurse.   

## 2019-06-07 LAB — KAPPA/LAMBDA LIGHT CHAINS
Kappa free light chain: 19.7 mg/L — ABNORMAL HIGH (ref 3.3–19.4)
Kappa, lambda light chain ratio: 7.3 — ABNORMAL HIGH (ref 0.26–1.65)
Lambda free light chains: 2.7 mg/L — ABNORMAL LOW (ref 5.7–26.3)

## 2019-06-08 ENCOUNTER — Other Ambulatory Visit: Payer: Self-pay

## 2019-06-08 LAB — MULTIPLE MYELOMA PANEL, SERUM
Albumin SerPl Elph-Mcnc: 3.5 g/dL (ref 2.9–4.4)
Albumin/Glob SerPl: 1.8 — ABNORMAL HIGH (ref 0.7–1.7)
Alpha 1: 0.2 g/dL (ref 0.0–0.4)
Alpha2 Glob SerPl Elph-Mcnc: 0.6 g/dL (ref 0.4–1.0)
B-Globulin SerPl Elph-Mcnc: 0.8 g/dL (ref 0.7–1.3)
Gamma Glob SerPl Elph-Mcnc: 0.2 g/dL — ABNORMAL LOW (ref 0.4–1.8)
Globulin, Total: 2 g/dL — ABNORMAL LOW (ref 2.2–3.9)
IgA: 5 mg/dL — ABNORMAL LOW (ref 61–437)
IgG (Immunoglobin G), Serum: 403 mg/dL — ABNORMAL LOW (ref 603–1613)
IgM (Immunoglobulin M), Srm: 15 mg/dL (ref 15–143)
M Protein SerPl Elph-Mcnc: 0.1 g/dL — ABNORMAL HIGH
Total Protein ELP: 5.5 g/dL — ABNORMAL LOW (ref 6.0–8.5)

## 2019-06-09 ENCOUNTER — Encounter (HOSPITAL_COMMUNITY): Payer: Self-pay

## 2019-06-09 ENCOUNTER — Inpatient Hospital Stay (HOSPITAL_COMMUNITY): Payer: Medicare Other

## 2019-06-09 VITALS — BP 135/63 | HR 76 | Temp 97.6°F | Resp 18

## 2019-06-09 DIAGNOSIS — D696 Thrombocytopenia, unspecified: Secondary | ICD-10-CM | POA: Diagnosis not present

## 2019-06-09 DIAGNOSIS — C7951 Secondary malignant neoplasm of bone: Secondary | ICD-10-CM

## 2019-06-09 DIAGNOSIS — C50222 Malignant neoplasm of upper-inner quadrant of left male breast: Secondary | ICD-10-CM | POA: Diagnosis not present

## 2019-06-09 DIAGNOSIS — C9000 Multiple myeloma not having achieved remission: Secondary | ICD-10-CM

## 2019-06-09 DIAGNOSIS — Z79810 Long term (current) use of selective estrogen receptor modulators (SERMs): Secondary | ICD-10-CM | POA: Diagnosis not present

## 2019-06-09 DIAGNOSIS — Z5111 Encounter for antineoplastic chemotherapy: Secondary | ICD-10-CM | POA: Diagnosis not present

## 2019-06-09 DIAGNOSIS — C50122 Malignant neoplasm of central portion of left male breast: Secondary | ICD-10-CM

## 2019-06-09 DIAGNOSIS — Z170 Estrogen receptor positive status [ER+]: Secondary | ICD-10-CM | POA: Diagnosis not present

## 2019-06-09 MED ORDER — DENOSUMAB 120 MG/1.7ML ~~LOC~~ SOLN
SUBCUTANEOUS | Status: AC
  Filled 2019-06-09: qty 1.7

## 2019-06-09 MED ORDER — DENOSUMAB 120 MG/1.7ML ~~LOC~~ SOLN
120.0000 mg | Freq: Once | SUBCUTANEOUS | Status: AC
  Administered 2019-06-09: 120 mg via SUBCUTANEOUS

## 2019-06-09 NOTE — Patient Instructions (Signed)
[facility] at [facility] Discharge Instructions  Received Xgeva injection today. Follow-up as scheduled. Call clinic for any questions or concerns   Thank you for choosing [facility] at [facility] to provide your oncology and hematology care.  To afford each patient quality time with our provider, please arrive at least 15 minutes before your scheduled appointment time.   If you have a lab appointment with the Cancer Center please come in thru the  Main Entrance and check in at the main information desk  You need to re-schedule your appointment should you arrive 10 or more minutes late.  We strive to give you quality time with our providers, and arriving late affects you and other patients whose appointments are after yours.  Also, if you no show three or more times for appointments you may be dismissed from the clinic at the providers discretion.     Again, thank you for choosing [facility].  Our hope is that these requests will decrease the amount of time that you wait before being seen by our physicians.       _____________________________________________________________  Should you have questions after your visit to  Cancer Center, please contact our office at [phone] between the hours of 8:00 a.m. and 4:30 p.m.  Voicemails left after 4:00 p.m. will not be returned until the following business day.  For prescription refill requests, have your pharmacy contact our office and allow 72 hours.    Cancer Center Support Programs:   > Cancer Support Group  2nd Tuesday of the month 1pm-2pm, Journey Room   

## 2019-06-09 NOTE — Progress Notes (Signed)
Johnny Lucas tolerated Xgeva injection well without complaints or incident. Calcium 8.9 today and pt denied any tooth or jaw pain and no recent or future dental visits planned prior to administering this medication.Pt continues to take his Calcium PO as prescribed VSS Pt discharged self ambulatory in satisfactory condition

## 2019-06-21 ENCOUNTER — Inpatient Hospital Stay (HOSPITAL_COMMUNITY): Payer: Medicare Other

## 2019-06-21 ENCOUNTER — Other Ambulatory Visit: Payer: Self-pay

## 2019-06-21 ENCOUNTER — Encounter (HOSPITAL_COMMUNITY): Payer: Self-pay | Admitting: Hematology

## 2019-06-21 ENCOUNTER — Inpatient Hospital Stay (HOSPITAL_BASED_OUTPATIENT_CLINIC_OR_DEPARTMENT_OTHER): Payer: Medicare Other | Admitting: Hematology

## 2019-06-21 VITALS — BP 129/63 | HR 66 | Temp 98.6°F | Resp 16

## 2019-06-21 DIAGNOSIS — Z79810 Long term (current) use of selective estrogen receptor modulators (SERMs): Secondary | ICD-10-CM

## 2019-06-21 DIAGNOSIS — Z5111 Encounter for antineoplastic chemotherapy: Secondary | ICD-10-CM | POA: Diagnosis not present

## 2019-06-21 DIAGNOSIS — Z79899 Other long term (current) drug therapy: Secondary | ICD-10-CM

## 2019-06-21 DIAGNOSIS — C9000 Multiple myeloma not having achieved remission: Secondary | ICD-10-CM

## 2019-06-21 DIAGNOSIS — D696 Thrombocytopenia, unspecified: Secondary | ICD-10-CM

## 2019-06-21 DIAGNOSIS — F419 Anxiety disorder, unspecified: Secondary | ICD-10-CM

## 2019-06-21 DIAGNOSIS — I129 Hypertensive chronic kidney disease with stage 1 through stage 4 chronic kidney disease, or unspecified chronic kidney disease: Secondary | ICD-10-CM

## 2019-06-21 DIAGNOSIS — Z7982 Long term (current) use of aspirin: Secondary | ICD-10-CM

## 2019-06-21 DIAGNOSIS — D631 Anemia in chronic kidney disease: Secondary | ICD-10-CM | POA: Diagnosis not present

## 2019-06-21 DIAGNOSIS — D539 Nutritional anemia, unspecified: Secondary | ICD-10-CM

## 2019-06-21 DIAGNOSIS — K219 Gastro-esophageal reflux disease without esophagitis: Secondary | ICD-10-CM

## 2019-06-21 DIAGNOSIS — C50222 Malignant neoplasm of upper-inner quadrant of left male breast: Secondary | ICD-10-CM | POA: Diagnosis not present

## 2019-06-21 DIAGNOSIS — Z923 Personal history of irradiation: Secondary | ICD-10-CM

## 2019-06-21 DIAGNOSIS — R42 Dizziness and giddiness: Secondary | ICD-10-CM

## 2019-06-21 DIAGNOSIS — Z170 Estrogen receptor positive status [ER+]: Secondary | ICD-10-CM

## 2019-06-21 DIAGNOSIS — N183 Chronic kidney disease, stage 3 (moderate): Secondary | ICD-10-CM | POA: Diagnosis not present

## 2019-06-21 DIAGNOSIS — G629 Polyneuropathy, unspecified: Secondary | ICD-10-CM

## 2019-06-21 LAB — COMPREHENSIVE METABOLIC PANEL
ALT: 15 U/L (ref 0–44)
AST: 15 U/L (ref 15–41)
Albumin: 3.8 g/dL (ref 3.5–5.0)
Alkaline Phosphatase: 28 U/L — ABNORMAL LOW (ref 38–126)
Anion gap: 11 (ref 5–15)
BUN: 24 mg/dL — ABNORMAL HIGH (ref 8–23)
CO2: 25 mmol/L (ref 22–32)
Calcium: 9.1 mg/dL (ref 8.9–10.3)
Chloride: 106 mmol/L (ref 98–111)
Creatinine, Ser: 1.69 mg/dL — ABNORMAL HIGH (ref 0.61–1.24)
GFR calc Af Amer: 46 mL/min — ABNORMAL LOW (ref >60)
GFR calc non Af Amer: 40 mL/min — ABNORMAL LOW (ref >60)
Glucose, Bld: 102 mg/dL — ABNORMAL HIGH (ref 70–99)
Potassium: 3.8 mmol/L (ref 3.5–5.1)
Sodium: 142 mmol/L (ref 135–145)
Total Bilirubin: 0.5 mg/dL (ref 0.3–1.2)
Total Protein: 5.9 g/dL — ABNORMAL LOW (ref 6.5–8.1)

## 2019-06-21 LAB — CBC WITH DIFFERENTIAL/PLATELET
Abs Immature Granulocytes: 0.01 10*3/uL (ref 0.00–0.07)
Basophils Absolute: 0.1 10*3/uL (ref 0.0–0.1)
Basophils Relative: 1 %
Eosinophils Absolute: 0.3 10*3/uL (ref 0.0–0.5)
Eosinophils Relative: 5 %
HCT: 31.6 % — ABNORMAL LOW (ref 39.0–52.0)
Hemoglobin: 10.3 g/dL — ABNORMAL LOW (ref 13.0–17.0)
Immature Granulocytes: 0 %
Lymphocytes Relative: 17 %
Lymphs Abs: 1 10*3/uL (ref 0.7–4.0)
MCH: 36.8 pg — ABNORMAL HIGH (ref 26.0–34.0)
MCHC: 32.6 g/dL (ref 30.0–36.0)
MCV: 112.9 fL — ABNORMAL HIGH (ref 80.0–100.0)
Monocytes Absolute: 0.6 10*3/uL (ref 0.1–1.0)
Monocytes Relative: 10 %
Neutro Abs: 4 10*3/uL (ref 1.7–7.7)
Neutrophils Relative %: 67 %
Platelets: 188 10*3/uL (ref 150–400)
RBC: 2.8 MIL/uL — ABNORMAL LOW (ref 4.22–5.81)
RDW: 14.7 % (ref 11.5–15.5)
WBC: 5.9 10*3/uL (ref 4.0–10.5)
nRBC: 0 % (ref 0.0–0.2)

## 2019-06-21 MED ORDER — SODIUM CHLORIDE 0.9% FLUSH
10.0000 mL | INTRAVENOUS | Status: DC | PRN
  Administered 2019-06-21: 10 mL
  Filled 2019-06-21: qty 10

## 2019-06-21 MED ORDER — DEXTROSE 5 % IV SOLN
34.0000 mg/m2 | Freq: Once | INTRAVENOUS | Status: AC
  Administered 2019-06-21: 12:00:00 70 mg via INTRAVENOUS
  Filled 2019-06-21: qty 30

## 2019-06-21 MED ORDER — SODIUM CHLORIDE 0.9 % IV SOLN
20.0000 mg | Freq: Once | INTRAVENOUS | Status: AC
  Administered 2019-06-21: 20 mg via INTRAVENOUS
  Filled 2019-06-21: qty 20

## 2019-06-21 MED ORDER — HEPARIN SOD (PORK) LOCK FLUSH 100 UNIT/ML IV SOLN
500.0000 [IU] | Freq: Once | INTRAVENOUS | Status: AC | PRN
  Administered 2019-06-21: 500 [IU]

## 2019-06-21 MED ORDER — SODIUM CHLORIDE 0.9 % IV SOLN
Freq: Once | INTRAVENOUS | Status: AC
  Administered 2019-06-21: 13:00:00 via INTRAVENOUS

## 2019-06-21 MED ORDER — ACETAMINOPHEN 325 MG PO TABS
650.0000 mg | ORAL_TABLET | Freq: Once | ORAL | Status: AC
  Administered 2019-06-21: 650 mg via ORAL
  Filled 2019-06-21: qty 2

## 2019-06-21 MED ORDER — SODIUM CHLORIDE 0.9 % IV SOLN
Freq: Once | INTRAVENOUS | Status: AC
  Administered 2019-06-21: 11:00:00 via INTRAVENOUS

## 2019-06-21 NOTE — Progress Notes (Signed)
Ser Creat 1.69 today.  Reviewed patients labs and hydration with Dr. Delton Coombes.  Give the patient NACL 265ml bolus before and after chemotherapy treatment and future treatments verbal order Dr. Delton Coombes.  Pharmacy notified.    Patient tolerated chemotherapy with no complaints voiced.  Port site clean and dry with no bruising or swelling noted at site.  No blood return noted before and after administration of chemotherapy. No complaints of pain with flush.  Band aid applied.  Patient left ambulatory with VSS and no s/s of distress noted.

## 2019-06-21 NOTE — Patient Instructions (Signed)
[facility] at [facility]  Discharge Instructions:  You saw Dr. katragadda today. _______________________________________________________________  Thank you for choosing [facility] at [facility] to provide your oncology and hematology care.  To afford each patient quality time with our providers, please arrive at least 15 minutes before your scheduled appointment.  You need to re-schedule your appointment if you arrive 10 or more minutes late.  We strive to give you quality time with our providers, and arriving late affects you and other patients whose appointments are after yours.  Also, if you no show three or more times for appointments you may be dismissed from the clinic.  Again, thank you for choosing [facility] at [facility]. Our hope is that these requests will allow you access to exceptional care and in a timely manner. _______________________________________________________________  If you have questions after your visit, please contact our office at [phone] between the hours of 8:30 a.m. and 5:00 p.m. Voicemails left after 4:30 p.m. will not be returned until the following business day. _______________________________________________________________  For prescription refill requests, have your pharmacy contact our office. _______________________________________________________________  Recommendations made by the consultant and any test results will be sent to your referring physician. _______________________________________________________________ 

## 2019-06-21 NOTE — Assessment & Plan Note (Addendum)
1.  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  He was recommended to have maintenance with carfilzomib 70 mg/m square on days 1 and 15 every 28 days by Dr. Norma Fredrickson. -He received his first carfilzomib 71m/m2 on 04/20/2018, developed fever with chills 3 hours later with weakness in the extremities.  He was admitted to the hospital, received 1 unit of blood transfusion.  Blood cultures turned out to be negative.  He was febrile after hospitalization and was sent home. - Subsequently he tolerated carfilzomib and smaller doses very well.  We have increased his doses to 60 mg/m on 09/01/2018 and he tolerated 4 doses very well with the addition of dexamethasone. - Carfilzomib dose was increased to 70 mg per metered square on 10/27/2018.   -He was hospitalized on 11/29/2018 through 12/05/2018 with bronchitis, positive for parainfluenza, acute renal failure and severe thrombocytopenia.  His platelet count went down to 9.  We have given IVIG 1 g/kg x 2 doses.  Platelet count has slowly improved. - Review of literature showed carfilzomib can cause dose-related thrombotic microangiopathy. -Carfilzomib restarted at reduced dose of 35 mg per metered square on 01/05/2019.   - Myeloma panel on 06/06/19 shows M spike of 0.1 g/dL.  Free light chain ratio has increased to 7.3,  Previously 3.75.  Kappa light chains were 19.7, previously 22.1 -We will continue carfilzomib at the same dose. Labs are acceptable to proceed with treatment today.  We will reevaluate him in 4 weeks with repeat myeloma labs.  2.  Metastatic left breast cancer to the  bones: -Diagnosed in 2011.  He status post lumpectomy.  He will continue tamoxifen.  3.  Peripheral neuropathy: -He will continue gabapentin 300 mg twice daily.  Neuropathy stable.  4.  ID prophylaxis: -We will continue acyclovir 400 mg twice daily.    5.  Bone strengthening: -We will continue monthly denosumab.  He will continue calcium supplements.  6.  Macrocytic anemia: -Etiology is stage III CKD.  Last Feraheme on 04/27/2019. -Procrit 20,000 units every 2 weeks started on 02/10/2019 with improvement.

## 2019-06-21 NOTE — Progress Notes (Signed)
Pt is taking tamoxifen as prescribed with no side effects.

## 2019-06-21 NOTE — Progress Notes (Signed)
Johnny Lucas, [place] [id number]   CLINIC:  Medical Oncology/Hematology  PCP:  [name], [place] [phone]   REASON FOR VISIT:  Follow-up for Multiple Myeloma  CURRENT THERAPY: Carfilzomib   BRIEF ONCOLOGIC HISTORY:  Oncology History  Breast cancer, male ([place])  12/31/2009 Initial Biopsy   Biopsy of L breast    12/31/2009 Pathology Results   Invasive ductal carcinoma, ER/PR+, HER 2 negative   12/31/2009 Imaging   Ultrasound showing a 2.43 x 1.85 x 3 cm hypoechoic spiculated mass in the 12 o clock L breast retroareolar region   01/01/2010 -  Anti-estrogen oral therapy   Tamoxifen 20 mg daily   01/06/2010 Imaging   Bone scan abnormal uptake in the diaphysis of the R humerus, abnormal in the R third, fifth and sixth ribs, lesion also noted in the sternum.   02/03/2010 Surgery   Rod placement and fixation of R humerus by Dr. [name]   02/05/2010 - 02/18/2010 Radiation Therapy   30Gy in 10 fractions of 3 Gy per fraction to R pathologic fracture   03/11/2010 -  Chemotherapy   Denosumab monthly, now every 3 months. Started at [facility]    06/09/2016 Imaging   Three hypermetabolic osseous lesions in the sternum, left ilium and right ilium, as discussed above, likely represent osseous metastases. At this time, these are not recognizable on the CT images. 2. No extra skeletal metastatic disease identified in the neck, chest, abdomen or pelvis.   10/13/2016 Progression   PET shows various new and enlarging osseous metastatic lesions with no definite extra osseous metastatic disease currently identified.    12/31/2016 Progression   1. Multifocal hypermetabolic osseous metastases throughout the axial and proximal appendicular skeleton, which are increased in size, number and metabolism since 10/13/2016 PET-CT. 2. 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 3. No additional sites of hypermetabolic metastatic disease. 4. Chronic right mastoid sinusitis. 5. Aortic atherosclerosis.  One vessel coronary atherosclerosis.   Multiple myeloma ([place])  02/12/2017 Bone Marrow Biopsy   The marrow was variably cellular with large peritrabecular aggregates of kappa restricted plasma cells (66% by aspirate, 30% by Cd138). Cytogenetics +11.    03/01/2017 - 06/29/2017 Chemotherapy   RVD    05/26/2017 Bone Marrow Biopsy   Performed at [facility]:  Plasma cell myeloma in a 30% cellularmarrow with decreased trilineage hematopoiesis and 42% kappalight chain restricted plasma cells on the aspirate smears andlarge aggregates on the core biopsy.    07/12/2017 - 09/01/2017 Chemotherapy   3 cycles of carfizolmib/cyclophosphamide/dexamethasone     10/08/2017 Bone Marrow Transplant   [facility]otransplant at [facility]      CANCER STAGING: Cancer Staging Multiple myeloma [facility]) Staging form: Plasma Cell Myeloma and Plasma Cell Disorders, AJCC 8th Edition - Clinical stage from 05/03/2017: Beta-2-microglobulin (mg/L): 3.5, Albumin (g/dL): 3.4, ISS: Stage II, High-risk cytogenetics: Absent, LDH: Not assessed - Signed by Baird Cancer, PA-C on 05/03/2017    INTERVAL HISTORY:  Johnny Lucas 73 y.o. male presents today for follow up. Reports overall doing well. Denies any significant fatigue. Denies any new bone pain. Currently on Carfilzomib, tolerating well. Denies any s/s of infection. No fevers, chills, night sweats. He states he is ready to proceed with treatment today.  He denies any bleeding per rectum or melena.  Numbness in the feet  has been stable.  Occasional dizziness when he stands up suddenly.  Denies any ER visits or hospitalizations.   REVIEW OF SYSTEMS:  Review of Systems  Neurological: Positive for dizziness and numbness.  All other systems reviewed and are negative.    PAST  MEDICAL/SURGICAL HISTORY:  Past Medical History:  Diagnosis Date  . Anxiety   . Bone metastases ([place]) 09/10/2016  . Breast cancer ([place]) 2011   Stave IV breast cancer; radiation and tamoxifen  . Breast cancer, male ([place])    Stave IV breast cancer; radiation and tamoxifen  Overview:  Left breast ca with mets bone  Overview:  METS TO BONE  . GERD (gastroesophageal reflux disease)   . Hypertension   . Macular degeneration   . Multiple myeloma ([name]) 02/18/2017  . Peripheral neuropathy    Past Surgical History:  Procedure Laterality Date  . BACK SURGERY    . CATARACT EXTRACTION W/PHACO Left 02/03/2019   Procedure: CATARACT EXTRACTION PHACO AND INTRAOCULAR LENS PLACEMENT ([place]);  Surgeon: Baruch Goldmann, MD;  Location: [facility];  Service: Ophthalmology;  Laterality: Left;  CDE: 15.89  . HERNIA REPAIR    . PORTACATH PLACEMENT Left 06/10/2018   Procedure: INSERTION PORT-A-CATH;  Surgeon: Virl Cagey, MD;  Location: [facility];  Service: General;  Laterality: Left;  . right arm surgery       SOCIAL HISTORY:  Social History   Socioeconomic History  . Marital status: Married    Spouse name: Not on file  . Number of children: 3  . Years of education: Not on file  . Highest education level: Not on file  Occupational History  . Not on file  Social Needs  . Financial resource strain: Not on file  . Food insecurity    Worry: Not on file    Inability: Not on file  . Transportation needs    Medical: Not on file    Non-medical: Not on file  Tobacco Use  . Smoking status: Never Smoker  . Smokeless tobacco: Former Network engineer and Sexual Activity  . Alcohol use: Yes    Alcohol/week: 24.0 standard drinks    Types: 24 Cans of beer per week  . Drug use: No  . Sexual activity: Not on file  Lifestyle  . Physical activity    Days per week: Not on file    Minutes per session: Not on file  . Stress: Not on file  Relationships  . Social Herbalist on phone: Not on file    Gets  together: Not on file    Attends religious service: Not on file    Active member of club or organization: Not on file    Attends meetings of clubs or organizations: Not on file    Relationship status: Not on file  . Intimate partner violence    Fear of current or ex partner: Not on file    Emotionally abused: Not on file    Physically abused: Not on file    Forced sexual activity: Not on file  Other Topics Concern  . Not on file  Social History Narrative  . Not on file    FAMILY HISTORY:  Family History  Problem Relation Age of Onset  . Stroke Mother   . Cancer Maternal Aunt        cancer NOS; died in her 81s  . Lung cancer Maternal Uncle        smoker    CURRENT MEDICATIONS:  Outpatient  Encounter Medications as of 06/21/2019  Medication Sig Note  . CARFILZOMIB IV Inject into the vein.   Marland Kitchen acyclovir (ZOVIRAX) 800 MG tablet Take 1 tablet (800 mg total) by mouth 2 (two) times daily.   Marland Kitchen ALPRAZolam (XANAX) 0.5 MG tablet TAKE 1 TABLET BY MOUTH AT BEDTIME AS NEEDED FOR ANXIETY OR SLEEP   . amLODipine (NORVASC) 5 MG tablet Take 1 tablet (5 mg total) by mouth daily.   Marland Kitchen aspirin EC 81 MG tablet Take 81 mg by mouth daily.   . calcium-vitamin D (OSCAL WITH D) 500-200 MG-UNIT TABS tablet Take 2 tablets by mouth daily. (Patient taking differently: Take 1 tablet by mouth 2 (two) times daily. )   . Cholecalciferol (VITAMIN D) 50 MCG (2000 UT) CAPS Take 4,000 Units by mouth 2 (two) times daily.   Marland Kitchen denosumab (XGEVA) 120 MG/1.7ML SOLN injection Inject 120 mg into the skin once. 01/18/2019: [facility]   . folic acid (FOLVITE) 1 MG tablet TAKE 1 TABLET BY MOUTH DAILY   . gabapentin (NEURONTIN) 300 MG capsule Take 1 capsule (300 mg total) by mouth 2 (two) times daily. Take two times a day, then increase to three times a day in a few weeks. (Patient taking differently: Take 300 mg by mouth 2 (two) times daily. )   . hydrALAZINE (APRESOLINE) 50 MG tablet Take 1 tablet (50 mg total) by mouth  every 8 (eight) hours. (Patient taking differently: Take 50 mg by mouth 2 (two) times daily. )   . loratadine (CLARITIN) 10 MG tablet Take 1 tablet (10 mg total) by mouth daily.   . metoprolol succinate (TOPROL-XL) 100 MG 24 hr tablet Take 100 mg by mouth daily.    . Multiple Vitamin (MULTI-VITAMIN) tablet Take by mouth.   . Multiple Vitamin (MULTIVITAMIN WITH MINERALS) TABS tablet Take 1 tablet by mouth daily.   . ondansetron (ZOFRAN) 8 MG tablet TAKE ONE TABLET BY MOUTH EVERY 8 HOURS AS NEEDED   . ondansetron (ZOFRAN) 8 MG tablet Take 1 tablet (8 mg total) by mouth every 8 (eight) hours as needed for nausea or vomiting.   . pantoprazole (PROTONIX) 40 MG tablet Take 40 mg by mouth every other day.    . polyethylene glycol (MIRALAX / GLYCOLAX) packet Take 17 g by mouth daily as needed for mild constipation.   . prochlorperazine (COMPAZINE) 10 MG tablet Take 1 tablet (10 mg total) by mouth every 6 (six) hours as needed for nausea or vomiting.   . pseudoephedrine-guaifenesin (MUCINEX D) 60-600 MG 12 hr tablet Take 1 tablet by mouth 2 (two) times daily as needed for congestion.    . tamoxifen (NOLVADEX) 20 MG tablet Take 1 tablet (20 mg total) by mouth daily.   . vitamin B-12 (CYANOCOBALAMIN) 1000 MCG tablet Take 1,000 mcg by mouth daily.    Facility-Administered Encounter Medications as of 06/21/2019  Medication  . heparin lock flush 100 unit/mL  . sodium chloride flush (NS) 0.9 % injection 10 mL  . sodium chloride flush (NS) 0.9 % injection 10 mL    ALLERGIES:  Allergies  Allergen Reactions  . Cefepime     Suspected severe thrombocytopenia is a result of cefepime induced antigen platelet destruction     PHYSICAL EXAM:  ECOG Performance status: 1  Vitals:   06/21/19 0948  BP: 131/67  Pulse: 65  Resp: 14  Temp: 98.6 F (37 C)  SpO2: 100%   Filed Weights   06/21/19 0948  Weight: 212 lb 6.4 oz (  96.3 kg)    Physical Exam Constitutional:      Appearance: Normal appearance.   HENT:     Head: Normocephalic.     Nose: Nose normal.     Mouth/Throat:     Mouth: Mucous membranes are moist.     Pharynx: Oropharynx is clear.  Eyes:     Extraocular Movements: Extraocular movements intact.     Conjunctiva/sclera: Conjunctivae normal.  Neck:     Musculoskeletal: Normal range of motion.  Cardiovascular:     Rate and Rhythm: Normal rate and regular rhythm.     Pulses: Normal pulses.     Heart sounds: Normal heart sounds.  Pulmonary:     Effort: Pulmonary effort is normal.     Breath sounds: Normal breath sounds.  Abdominal:     General: Bowel sounds are normal.     Palpations: Abdomen is soft.  Musculoskeletal: Normal range of motion.  Skin:    General: Skin is warm and dry.  Neurological:     General: No focal deficit present.     Mental Status: He is alert and oriented to person, place, and time. Mental status is at baseline.  Psychiatric:        Mood and Affect: Mood normal.        Behavior: Behavior normal.        Thought Content: Thought content normal.        Judgment: Judgment normal.      LABORATORY DATA:  I have reviewed the labs as listed.  CBC    Component Value Date/Time   WBC 5.9 06/21/2019 0933   RBC 2.80 (L) 06/21/2019 0933   HGB 10.3 (L) 06/21/2019 0933   HCT 31.6 (L) 06/21/2019 0933   PLT 188 06/21/2019 0933   MCV 112.9 (H) 06/21/2019 0933   MCH 36.8 (H) 06/21/2019 0933   MCHC 32.6 06/21/2019 0933   RDW 14.7 06/21/2019 0933   LYMPHSABS 1.0 06/21/2019 0933   MONOABS 0.6 06/21/2019 0933   EOSABS 0.3 06/21/2019 0933   BASOSABS 0.1 06/21/2019 0933   CMP Latest Ref Rng & Units 06/21/2019 06/06/2019 05/23/2019  Glucose 70 - 99 mg/dL 102(H) 96 112(H)  BUN 8 - 23 mg/dL 24(H) 22 19  Creatinine 0.61 - 1.24 mg/dL 1.69(H) 1.75(H) 1.76(H)  Sodium 135 - 145 mmol/L 142 140 140  Potassium 3.5 - 5.1 mmol/L 3.8 4.5 4.0  Chloride 98 - 111 mmol/L 106 108 109  CO2 22 - 32 mmol/L _0 Calcium 8.9 - 10.3 mg/dL 9.1 8.9 8.9  Total Protein 6.5  - 8.1 g/dL 5.9(L) 6.0(L) 5.8(L)  Total Bilirubin 0.3 - 1.2 mg/dL 0.5 0.6 0.4  Alkaline Phos 38 - 126 U/L 28(L) 26(L) 25(L)  AST 15 - 41 U/L _1 ALT 0 - 44 U/L _2 Radiology: I have reviewed his previous scans.  ASSESSMENT & PLAN:   Multiple myeloma ([place] 1.  Stage II IgG kappa multiple myeloma (Dx February 2018): -RVD from 03/01/2017 through 06/29/2017 -Chemotherapy changed to 3 cycles of CCyD from 07/12/2017-04/2017 to obtain deeper response - Status post auto stem cell transplant on 10/08/2017 -Stable M spike after transplant, started on CCyD, cycle 1 on 02/14/2018, cycle 2 on 03/14/2018 -His M spike after 2 cycles was undetectable, when measured at [facility] last week.  He was recommended to have maintenance with carfilzomib 70 mg/m square on days 1 and 15 every 28 days by Dr. Norma Fredrickson. -He received his first  carfilzomib 19m/m2 on 04/20/2018, developed fever with chills 3 hours later with weakness in the extremities.  He was admitted to the hospital, received 1 unit of blood transfusion.  Blood cultures turned out to be negative.  He was febrile after hospitalization and was sent home. - Subsequently he tolerated carfilzomib and smaller doses very well.  We have increased his doses to 60 mg/m on 09/01/2018 and he tolerated 4 doses very well with the addition of dexamethasone. - Carfilzomib dose was increased to 70 mg per metered square on 10/27/2018.   -He was hospitalized on 11/29/2018 through 12/05/2018 with bronchitis, positive for parainfluenza, acute renal failure and severe thrombocytopenia.  His platelet count went down to 9.  We have given IVIG 1 g/kg x 2 doses.  Platelet count has slowly improved. - Review of literature showed carfilzomib can cause dose-related thrombotic microangiopathy. -Carfilzomib restarted at reduced dose of 35 mg per metered square on 01/05/2019.   - Myeloma panel on 06/06/19 shows M spike of 0.1 g/dL.  Free light chain ratio has increased to 7.3,   Previously 3.75.  Kappa light chains were 19.7, previously 22.1 -We will continue carfilzomib at the same dose. Labs are acceptable to proceed with treatment today.  We will reevaluate him in 4 weeks with repeat myeloma labs.  2.  Metastatic left breast cancer to the bones: -Diagnosed in 2011.  He status post lumpectomy.  He will continue tamoxifen.  3.  Peripheral neuropathy: -His neuropathy has been stable.  He will continue gabapentin 300 mg twice daily.  4.  ID prophylaxis: -He will continue acyclovir 400 mg twice daily.  5.  Bone strengthening: -We will continue monthly denosumab.  He will also continue calcium supplements.  6.  Macrocytic anemia: -Etiology is stage III CKD.  Last Feraheme was on 04/27/2019. - Procrit 20,000 units every 2 weeks was started on 02/10/2019.      Total time spent is 25 minutes with more than 50% of the time spent face-to-face discussing myeloma labs, further treatment plan, counseling and coordination of care.  Orders placed this encounter:  Orders Placed This Encounter  Procedures  . CBC with Differential  . Comprehensive metabolic panel  . Kappa/lambda light chains  . Multiple Myeloma Panel (SPEP&IFE w/QIG)      SDerek Jack MD [phone]

## 2019-07-05 ENCOUNTER — Other Ambulatory Visit: Payer: Self-pay

## 2019-07-05 ENCOUNTER — Inpatient Hospital Stay (HOSPITAL_COMMUNITY): Payer: Medicare Other

## 2019-07-05 ENCOUNTER — Inpatient Hospital Stay (HOSPITAL_COMMUNITY): Payer: Medicare Other | Attending: Hematology

## 2019-07-05 VITALS — BP 128/58 | HR 60 | Temp 97.4°F | Resp 18 | Wt 214.0 lb

## 2019-07-05 DIAGNOSIS — G629 Polyneuropathy, unspecified: Secondary | ICD-10-CM | POA: Insufficient documentation

## 2019-07-05 DIAGNOSIS — Z5111 Encounter for antineoplastic chemotherapy: Secondary | ICD-10-CM | POA: Diagnosis not present

## 2019-07-05 DIAGNOSIS — Z170 Estrogen receptor positive status [ER+]: Secondary | ICD-10-CM | POA: Diagnosis not present

## 2019-07-05 DIAGNOSIS — Z79810 Long term (current) use of selective estrogen receptor modulators (SERMs): Secondary | ICD-10-CM | POA: Insufficient documentation

## 2019-07-05 DIAGNOSIS — Z9484 Stem cells transplant status: Secondary | ICD-10-CM | POA: Insufficient documentation

## 2019-07-05 DIAGNOSIS — C50921 Malignant neoplasm of unspecified site of right male breast: Secondary | ICD-10-CM | POA: Insufficient documentation

## 2019-07-05 DIAGNOSIS — C9000 Multiple myeloma not having achieved remission: Secondary | ICD-10-CM | POA: Insufficient documentation

## 2019-07-05 DIAGNOSIS — Z79899 Other long term (current) drug therapy: Secondary | ICD-10-CM | POA: Insufficient documentation

## 2019-07-05 DIAGNOSIS — D696 Thrombocytopenia, unspecified: Secondary | ICD-10-CM | POA: Insufficient documentation

## 2019-07-05 DIAGNOSIS — Z9221 Personal history of antineoplastic chemotherapy: Secondary | ICD-10-CM | POA: Diagnosis not present

## 2019-07-05 DIAGNOSIS — N183 Chronic kidney disease, stage 3 (moderate): Secondary | ICD-10-CM | POA: Insufficient documentation

## 2019-07-05 DIAGNOSIS — D649 Anemia, unspecified: Secondary | ICD-10-CM | POA: Insufficient documentation

## 2019-07-05 LAB — COMPREHENSIVE METABOLIC PANEL
ALT: 15 U/L (ref 0–44)
AST: 17 U/L (ref 15–41)
Albumin: 3.9 g/dL (ref 3.5–5.0)
Alkaline Phosphatase: 30 U/L — ABNORMAL LOW (ref 38–126)
Anion gap: 8 (ref 5–15)
BUN: 17 mg/dL (ref 8–23)
CO2: 23 mmol/L (ref 22–32)
Calcium: 8.3 mg/dL — ABNORMAL LOW (ref 8.9–10.3)
Chloride: 111 mmol/L (ref 98–111)
Creatinine, Ser: 1.61 mg/dL — ABNORMAL HIGH (ref 0.61–1.24)
GFR calc Af Amer: 49 mL/min — ABNORMAL LOW (ref >60)
GFR calc non Af Amer: 42 mL/min — ABNORMAL LOW (ref >60)
Glucose, Bld: 116 mg/dL — ABNORMAL HIGH (ref 70–99)
Potassium: 4 mmol/L (ref 3.5–5.1)
Sodium: 142 mmol/L (ref 135–145)
Total Bilirubin: 0.3 mg/dL (ref 0.3–1.2)
Total Protein: 6 g/dL — ABNORMAL LOW (ref 6.5–8.1)

## 2019-07-05 LAB — CBC WITH DIFFERENTIAL/PLATELET
Abs Immature Granulocytes: 0.02 10*3/uL (ref 0.00–0.07)
Basophils Absolute: 0.1 10*3/uL (ref 0.0–0.1)
Basophils Relative: 1 %
Eosinophils Absolute: 0.3 10*3/uL (ref 0.0–0.5)
Eosinophils Relative: 5 %
HCT: 31.5 % — ABNORMAL LOW (ref 39.0–52.0)
Hemoglobin: 10.2 g/dL — ABNORMAL LOW (ref 13.0–17.0)
Immature Granulocytes: 0 %
Lymphocytes Relative: 18 %
Lymphs Abs: 1.2 10*3/uL (ref 0.7–4.0)
MCH: 36.2 pg — ABNORMAL HIGH (ref 26.0–34.0)
MCHC: 32.4 g/dL (ref 30.0–36.0)
MCV: 111.7 fL — ABNORMAL HIGH (ref 80.0–100.0)
Monocytes Absolute: 0.6 10*3/uL (ref 0.1–1.0)
Monocytes Relative: 9 %
Neutro Abs: 4.3 10*3/uL (ref 1.7–7.7)
Neutrophils Relative %: 67 %
Platelets: 184 10*3/uL (ref 150–400)
RBC: 2.82 MIL/uL — ABNORMAL LOW (ref 4.22–5.81)
RDW: 13.8 % (ref 11.5–15.5)
WBC: 6.4 10*3/uL (ref 4.0–10.5)
nRBC: 0 % (ref 0.0–0.2)

## 2019-07-05 MED ORDER — SODIUM CHLORIDE 0.9 % IV SOLN
Freq: Once | INTRAVENOUS | Status: AC
  Administered 2019-07-05: 10:00:00 via INTRAVENOUS

## 2019-07-05 MED ORDER — SODIUM CHLORIDE 0.9 % IV SOLN
20.0000 mg | Freq: Once | INTRAVENOUS | Status: AC
  Administered 2019-07-05: 20 mg via INTRAVENOUS
  Filled 2019-07-05: qty 20

## 2019-07-05 MED ORDER — DEXTROSE 5 % IV SOLN
34.0000 mg/m2 | Freq: Once | INTRAVENOUS | Status: AC
  Administered 2019-07-05: 70 mg via INTRAVENOUS
  Filled 2019-07-05: qty 30

## 2019-07-05 MED ORDER — SODIUM CHLORIDE 0.9% FLUSH
10.0000 mL | INTRAVENOUS | Status: DC | PRN
  Administered 2019-07-05: 10 mL
  Filled 2019-07-05: qty 10

## 2019-07-05 MED ORDER — SODIUM CHLORIDE 0.9 % IV SOLN
Freq: Once | INTRAVENOUS | Status: AC
  Administered 2019-07-05: 12:00:00 via INTRAVENOUS

## 2019-07-05 MED ORDER — ACETAMINOPHEN 325 MG PO TABS
650.0000 mg | ORAL_TABLET | Freq: Once | ORAL | Status: AC
  Administered 2019-07-05: 650 mg via ORAL
  Filled 2019-07-05: qty 2

## 2019-07-05 MED ORDER — HEPARIN SOD (PORK) LOCK FLUSH 100 UNIT/ML IV SOLN
500.0000 [IU] | Freq: Once | INTRAVENOUS | Status: AC | PRN
  Administered 2019-07-05: 500 [IU]

## 2019-07-05 NOTE — Patient Instructions (Signed)
[facility] Discharge Instructions for Patients Receiving Chemotherapy  Today you received the following chemotherapy agents   To help prevent nausea and vomiting after your treatment, we encourage you to take your nausea medication   If you develop nausea and vomiting that is not controlled by your nausea medication, call the clinic.   BELOW ARE SYMPTOMS THAT SHOULD BE REPORTED IMMEDIATELY:  *FEVER GREATER THAN 100.5 F  *CHILLS WITH OR WITHOUT FEVER  NAUSEA AND VOMITING THAT IS NOT CONTROLLED WITH YOUR NAUSEA MEDICATION  *UNUSUAL SHORTNESS OF BREATH  *UNUSUAL BRUISING OR BLEEDING  TENDERNESS IN MOUTH AND THROAT WITH OR WITHOUT PRESENCE OF ULCERS  *URINARY PROBLEMS  *BOWEL PROBLEMS  UNUSUAL RASH Items with * indicate a potential emergency and should be followed up as soon as possible.  Feel free to call the clinic should you have any questions or concerns. The clinic phone number is [phone].  Please show the CHEMO ALERT CARD at check-in to the Emergency Department and triage nurse.   

## 2019-07-05 NOTE — Progress Notes (Signed)
Pt presents today for treatment. VS within parameters for treatment. Pt has no complaints of any changes since the last visit. Labs drawn and pending. MAR reviewed.   Labs reviewed by Dr. Delton Coombes. Creat 1.6. MD aware. VO received proceed with treatment. No Procrit today due to HGB 10.2.   Treatment given today per MD orders. Tolerated infusion without adverse affects. Vital signs stable. No complaints at this time. Discharged from clinic ambulatory. F/U with [facility] as scheduled.

## 2019-07-06 LAB — KAPPA/LAMBDA LIGHT CHAINS
Kappa free light chain: 28.2 mg/L — ABNORMAL HIGH (ref 3.3–19.4)
Kappa, lambda light chain ratio: 2.85 — ABNORMAL HIGH (ref 0.26–1.65)
Lambda free light chains: 9.9 mg/L (ref 5.7–26.3)

## 2019-07-07 ENCOUNTER — Encounter (HOSPITAL_COMMUNITY): Payer: Self-pay

## 2019-07-07 ENCOUNTER — Inpatient Hospital Stay (HOSPITAL_COMMUNITY): Payer: Medicare Other

## 2019-07-07 ENCOUNTER — Other Ambulatory Visit: Payer: Self-pay

## 2019-07-07 VITALS — BP 116/63 | HR 69 | Temp 97.7°F | Resp 18

## 2019-07-07 DIAGNOSIS — C9000 Multiple myeloma not having achieved remission: Secondary | ICD-10-CM | POA: Diagnosis not present

## 2019-07-07 DIAGNOSIS — Z5111 Encounter for antineoplastic chemotherapy: Secondary | ICD-10-CM | POA: Diagnosis not present

## 2019-07-07 DIAGNOSIS — Z79810 Long term (current) use of selective estrogen receptor modulators (SERMs): Secondary | ICD-10-CM | POA: Diagnosis not present

## 2019-07-07 DIAGNOSIS — C50122 Malignant neoplasm of central portion of left male breast: Secondary | ICD-10-CM

## 2019-07-07 DIAGNOSIS — D696 Thrombocytopenia, unspecified: Secondary | ICD-10-CM | POA: Diagnosis not present

## 2019-07-07 DIAGNOSIS — Z170 Estrogen receptor positive status [ER+]: Secondary | ICD-10-CM | POA: Diagnosis not present

## 2019-07-07 DIAGNOSIS — C50921 Malignant neoplasm of unspecified site of right male breast: Secondary | ICD-10-CM | POA: Diagnosis not present

## 2019-07-07 DIAGNOSIS — C7951 Secondary malignant neoplasm of bone: Secondary | ICD-10-CM

## 2019-07-07 LAB — MULTIPLE MYELOMA PANEL, SERUM
Albumin SerPl Elph-Mcnc: 3.6 g/dL (ref 2.9–4.4)
Albumin/Glob SerPl: 1.9 — ABNORMAL HIGH (ref 0.7–1.7)
Alpha 1: 0.2 g/dL (ref 0.0–0.4)
Alpha2 Glob SerPl Elph-Mcnc: 0.6 g/dL (ref 0.4–1.0)
B-Globulin SerPl Elph-Mcnc: 0.8 g/dL (ref 0.7–1.3)
Gamma Glob SerPl Elph-Mcnc: 0.3 g/dL — ABNORMAL LOW (ref 0.4–1.8)
Globulin, Total: 1.9 g/dL — ABNORMAL LOW (ref 2.2–3.9)
IgA: 6 mg/dL — ABNORMAL LOW (ref 61–437)
IgG (Immunoglobin G), Serum: 339 mg/dL — ABNORMAL LOW (ref 603–1613)
IgM (Immunoglobulin M), Srm: 20 mg/dL (ref 15–143)
M Protein SerPl Elph-Mcnc: 0.1 g/dL — ABNORMAL HIGH
Total Protein ELP: 5.5 g/dL — ABNORMAL LOW (ref 6.0–8.5)

## 2019-07-07 MED ORDER — DENOSUMAB 120 MG/1.7ML ~~LOC~~ SOLN
120.0000 mg | Freq: Once | SUBCUTANEOUS | Status: AC
  Administered 2019-07-07: 11:00:00 120 mg via SUBCUTANEOUS
  Filled 2019-07-07: qty 1.7

## 2019-07-07 NOTE — Progress Notes (Signed)
.  Johnny Lucas presents today for injection per the provider's orders. Xgeva administrated without incident; see MAR for injection details.  Patient tolerated procedure well and without incident.  No questions or complaints noted at this time.

## 2019-07-07 NOTE — Progress Notes (Signed)
To treatment area for xgeva shot.  Taking calcium as directed.  Denied tooth, jaw,and leg pain.  No recent or upcoming dental visits.  No s/s of distress noted.

## 2019-07-19 ENCOUNTER — Encounter (HOSPITAL_COMMUNITY): Payer: Self-pay | Admitting: Hematology

## 2019-07-19 ENCOUNTER — Inpatient Hospital Stay (HOSPITAL_COMMUNITY): Payer: Medicare Other

## 2019-07-19 ENCOUNTER — Ambulatory Visit (HOSPITAL_COMMUNITY)
Admission: RE | Admit: 2019-07-19 | Discharge: 2019-07-19 | Disposition: A | Discharge status: Home / Self Care | Payer: Medicare Other | Source: Ambulatory Visit | Attending: Hematology | Admitting: Hematology

## 2019-07-19 ENCOUNTER — Other Ambulatory Visit: Payer: Self-pay

## 2019-07-19 ENCOUNTER — Encounter (HOSPITAL_COMMUNITY): Payer: Self-pay

## 2019-07-19 ENCOUNTER — Inpatient Hospital Stay (HOSPITAL_BASED_OUTPATIENT_CLINIC_OR_DEPARTMENT_OTHER): Payer: Medicare Other | Admitting: Hematology

## 2019-07-19 VITALS — BP 148/68 | HR 58 | Temp 97.1°F | Resp 18 | Wt 214.0 lb

## 2019-07-19 VITALS — BP 116/62 | HR 61 | Temp 97.6°F | Resp 18

## 2019-07-19 DIAGNOSIS — Z79899 Other long term (current) drug therapy: Secondary | ICD-10-CM | POA: Diagnosis not present

## 2019-07-19 DIAGNOSIS — N183 Chronic kidney disease, stage 3 (moderate): Secondary | ICD-10-CM

## 2019-07-19 DIAGNOSIS — Z9484 Stem cells transplant status: Secondary | ICD-10-CM | POA: Diagnosis not present

## 2019-07-19 DIAGNOSIS — Z79810 Long term (current) use of selective estrogen receptor modulators (SERMs): Secondary | ICD-10-CM | POA: Diagnosis not present

## 2019-07-19 DIAGNOSIS — D696 Thrombocytopenia, unspecified: Secondary | ICD-10-CM | POA: Diagnosis not present

## 2019-07-19 DIAGNOSIS — Z5111 Encounter for antineoplastic chemotherapy: Secondary | ICD-10-CM | POA: Diagnosis not present

## 2019-07-19 DIAGNOSIS — Z9221 Personal history of antineoplastic chemotherapy: Secondary | ICD-10-CM

## 2019-07-19 DIAGNOSIS — G629 Polyneuropathy, unspecified: Secondary | ICD-10-CM | POA: Diagnosis not present

## 2019-07-19 DIAGNOSIS — C9000 Multiple myeloma not having achieved remission: Secondary | ICD-10-CM

## 2019-07-19 DIAGNOSIS — Z170 Estrogen receptor positive status [ER+]: Secondary | ICD-10-CM

## 2019-07-19 DIAGNOSIS — C50921 Malignant neoplasm of unspecified site of right male breast: Secondary | ICD-10-CM | POA: Diagnosis not present

## 2019-07-19 DIAGNOSIS — D649 Anemia, unspecified: Secondary | ICD-10-CM

## 2019-07-19 DIAGNOSIS — T82598A Other mechanical complication of other cardiac and vascular devices and implants, initial encounter: Secondary | ICD-10-CM | POA: Diagnosis not present

## 2019-07-19 LAB — CBC WITH DIFFERENTIAL/PLATELET
Abs Immature Granulocytes: 0.01 10*3/uL (ref 0.00–0.07)
Basophils Absolute: 0.1 10*3/uL (ref 0.0–0.1)
Basophils Relative: 1 %
Eosinophils Absolute: 0.3 10*3/uL (ref 0.0–0.5)
Eosinophils Relative: 5 %
HCT: 31.5 % — ABNORMAL LOW (ref 39.0–52.0)
Hemoglobin: 10.3 g/dL — ABNORMAL LOW (ref 13.0–17.0)
Immature Granulocytes: 0 %
Lymphocytes Relative: 19 %
Lymphs Abs: 1.2 10*3/uL (ref 0.7–4.0)
MCH: 36.7 pg — ABNORMAL HIGH (ref 26.0–34.0)
MCHC: 32.7 g/dL (ref 30.0–36.0)
MCV: 112.1 fL — ABNORMAL HIGH (ref 80.0–100.0)
Monocytes Absolute: 0.6 10*3/uL (ref 0.1–1.0)
Monocytes Relative: 9 %
Neutro Abs: 4 10*3/uL (ref 1.7–7.7)
Neutrophils Relative %: 66 %
Platelets: 172 10*3/uL (ref 150–400)
RBC: 2.81 MIL/uL — ABNORMAL LOW (ref 4.22–5.81)
RDW: 13.1 % (ref 11.5–15.5)
WBC: 6.2 10*3/uL (ref 4.0–10.5)
nRBC: 0 % (ref 0.0–0.2)

## 2019-07-19 LAB — COMPREHENSIVE METABOLIC PANEL
ALT: 16 U/L (ref 0–44)
AST: 19 U/L (ref 15–41)
Albumin: 3.9 g/dL (ref 3.5–5.0)
Alkaline Phosphatase: 27 U/L — ABNORMAL LOW (ref 38–126)
Anion gap: 9 (ref 5–15)
BUN: 15 mg/dL (ref 8–23)
CO2: 26 mmol/L (ref 22–32)
Calcium: 9.2 mg/dL (ref 8.9–10.3)
Chloride: 105 mmol/L (ref 98–111)
Creatinine, Ser: 1.76 mg/dL — ABNORMAL HIGH (ref 0.61–1.24)
GFR calc Af Amer: 44 mL/min — ABNORMAL LOW (ref >60)
GFR calc non Af Amer: 38 mL/min — ABNORMAL LOW (ref >60)
Glucose, Bld: 100 mg/dL — ABNORMAL HIGH (ref 70–99)
Potassium: 4.6 mmol/L (ref 3.5–5.1)
Sodium: 140 mmol/L (ref 135–145)
Total Bilirubin: 0.5 mg/dL (ref 0.3–1.2)
Total Protein: 5.8 g/dL — ABNORMAL LOW (ref 6.5–8.1)

## 2019-07-19 MED ORDER — SODIUM CHLORIDE 0.9% FLUSH
10.0000 mL | INTRAVENOUS | Status: DC | PRN
  Administered 2019-07-19: 13:00:00 10 mL
  Filled 2019-07-19: qty 10

## 2019-07-19 MED ORDER — ALTEPLASE 2 MG IJ SOLR
2.0000 mg | Freq: Once | INTRAMUSCULAR | Status: AC | PRN
  Administered 2019-07-19: 12:00:00 2 mg
  Filled 2019-07-19: qty 2

## 2019-07-19 MED ORDER — DEXTROSE 5 % IV SOLN
34.0000 mg/m2 | Freq: Once | INTRAVENOUS | Status: AC
  Administered 2019-07-19: 70 mg via INTRAVENOUS
  Filled 2019-07-19: qty 30

## 2019-07-19 MED ORDER — ACETAMINOPHEN 325 MG PO TABS
650.0000 mg | ORAL_TABLET | Freq: Once | ORAL | Status: AC
  Administered 2019-07-19: 650 mg via ORAL
  Filled 2019-07-19: qty 2

## 2019-07-19 MED ORDER — STERILE WATER FOR INJECTION IJ SOLN
INTRAMUSCULAR | Status: AC
  Filled 2019-07-19: qty 10

## 2019-07-19 MED ORDER — SODIUM CHLORIDE 0.9 % IV SOLN
Freq: Once | INTRAVENOUS | Status: AC
  Administered 2019-07-19: 13:00:00 via INTRAVENOUS

## 2019-07-19 MED ORDER — IOHEXOL 300 MG/ML  SOLN
50.0000 mL | Freq: Once | INTRAMUSCULAR | Status: AC | PRN
  Administered 2019-07-19: 20 mL via INTRAVENOUS

## 2019-07-19 MED ORDER — SODIUM CHLORIDE 0.9 % IV SOLN
20.0000 mg | Freq: Once | INTRAVENOUS | Status: AC
  Administered 2019-07-19: 14:00:00 20 mg via INTRAVENOUS
  Filled 2019-07-19: qty 20

## 2019-07-19 MED ORDER — HEPARIN SOD (PORK) LOCK FLUSH 100 UNIT/ML IV SOLN
500.0000 [IU] | Freq: Once | INTRAVENOUS | Status: AC | PRN
  Administered 2019-07-19: 16:00:00 500 [IU]

## 2019-07-19 MED ORDER — SODIUM CHLORIDE FLUSH 0.9 % IV SOLN
INTRAVENOUS | Status: AC
  Filled 2019-07-19: qty 10

## 2019-07-19 MED ORDER — SODIUM CHLORIDE 0.9 % IV SOLN
Freq: Once | INTRAVENOUS | Status: AC
  Administered 2019-07-19: 15:00:00 via INTRAVENOUS

## 2019-07-19 NOTE — Patient Instructions (Addendum)
[facility] Discharge Instructions  You were seen today by Dr. Katragadda. He went over your recent lab results. He will see you back in 6 weeks for labs and follow up.   Thank you for choosing [facility] at [facility] to provide your oncology and hematology care.  To afford each patient quality time with our provider, please arrive at least 15 minutes before your scheduled appointment time.   If you have a lab appointment with the Cancer Center please come in thru the  Main Entrance and check in at the main information desk  You need to re-schedule your appointment should you arrive 10 or more minutes late.  We strive to give you quality time with our providers, and arriving late affects you and other patients whose appointments are after yours.  Also, if you no show three or more times for appointments you may be dismissed from the clinic at the providers discretion.     Again, thank you for choosing [facility].  Our hope is that these requests will decrease the amount of time that you wait before being seen by our physicians.       _____________________________________________________________  Should you have questions after your visit to [facility], please contact our office at [phone] between the hours of 8:00 a.m. and 4:30 p.m.  Voicemails left after 4:00 p.m. will not be returned until the following business day.  For prescription refill requests, have your pharmacy contact our office and allow 72 hours.    Cancer Center Support Programs:   > Cancer Support Group  2nd Tuesday of the month 1pm-2pm, Journey Room    

## 2019-07-19 NOTE — Progress Notes (Signed)
Patients port would not give blood return after Alteplase.  Alteplase 68ml pulled from port with no complaints of pain.  Port flushed easily with normal saline.  No s/s of distress.

## 2019-07-19 NOTE — Patient Instructions (Signed)
[facility] Discharge Instructions for Patients Receiving Chemotherapy  Today you received the following chemotherapy agents   To help prevent nausea and vomiting after your treatment, we encourage you to take your nausea medication   If you develop nausea and vomiting that is not controlled by your nausea medication, call the clinic.   BELOW ARE SYMPTOMS THAT SHOULD BE REPORTED IMMEDIATELY:  *FEVER GREATER THAN 100.5 F  *CHILLS WITH OR WITHOUT FEVER  NAUSEA AND VOMITING THAT IS NOT CONTROLLED WITH YOUR NAUSEA MEDICATION  *UNUSUAL SHORTNESS OF BREATH  *UNUSUAL BRUISING OR BLEEDING  TENDERNESS IN MOUTH AND THROAT WITH OR WITHOUT PRESENCE OF ULCERS  *URINARY PROBLEMS  *BOWEL PROBLEMS  UNUSUAL RASH Items with * indicate a potential emergency and should be followed up as soon as possible.  Feel free to call the clinic should you have any questions or concerns. The clinic phone number is [phone].  Please show the CHEMO ALERT CARD at check-in to the Emergency Department and triage nurse.   

## 2019-07-19 NOTE — Progress Notes (Signed)
Pt presents today for f/u office visit and treatment. Pt sent to Radiology prior to arrival to the [facility]. Port does not give blood. Dye Study performed. Injection of port under fluoroscopy. Findings noted to be consistent with fibrin sheath. VS within parameters for treatment. Labs pending. Message sent to Dr.Katragadda/ ATravis LPN with findings. Fibrin sheath noted on report.   Message received by ATravis LPN. Per Dr. Delton Coombes administer Alteplase now. Report received from dye study.   Labs reviewed by Dr. Delton Coombes. Message received okay for tx once the Alteplase is completed and blood return noted from port. Reported creatinine of 1.76. MD aware.   VO received from RNester NP. Proceed with tx today. Dye study report reviewed.   Treatment given today per MD orders. Tolerated infusion without adverse affects. Vital signs stable. No complaints at this time. Discharged from clinic ambulatory. F/U with [facility] as scheduled.

## 2019-07-19 NOTE — Progress Notes (Signed)
[place], [place] [address]   CLINIC:  Medical Oncology/Hematology  PCP:  [name], MD [place] [id number] [phone]   REASON FOR VISIT:  Follow-up for Multiple Myeloma  CURRENT THERAPY: Carfilzomib   BRIEF ONCOLOGIC HISTORY:  Oncology History  Breast cancer, male ([place])  12/31/2009 Initial Biopsy   Biopsy of L breast    12/31/2009 Pathology Results   Invasive ductal carcinoma, ER/PR+, HER 2 negative   12/31/2009 Imaging   Ultrasound showing a 2.43 x 1.85 x 3 cm hypoechoic spiculated mass in the 12 o clock L breast retroareolar region   01/01/2010 -  Anti-estrogen oral therapy   Tamoxifen 20 mg daily   01/06/2010 Imaging   Bone scan abnormal uptake in the diaphysis of the R humerus, abnormal in the R third, fifth and sixth ribs, lesion also noted in the sternum.   02/03/2010 Surgery   Rod placement and fixation of R humerus by Dr. [name]   02/05/2010 - 02/18/2010 Radiation Therapy   30Gy in 10 fractions of 3 Gy per fraction to R pathologic fracture   03/11/2010 -  Chemotherapy   Denosumab monthly, now every 3 months. Started at [facility]    06/09/2016 Imaging   Three hypermetabolic osseous lesions in the sternum, left ilium and right ilium, as discussed above, likely represent osseous metastases. At this time, these are not recognizable on the CT images. 2. No extra skeletal metastatic disease identified in the neck, chest, abdomen or pelvis.   10/13/2016 Progression   PET shows various new and enlarging osseous metastatic lesions with no definite extra osseous metastatic disease currently identified.    12/31/2016 Progression   1. Multifocal hypermetabolic osseous metastases throughout the axial and proximal appendicular skeleton, which are increased in size, number and metabolism since 10/13/2016 PET-CT. 2. 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 3. No additional sites of hypermetabolic metastatic disease. 4. Chronic right mastoid sinusitis. 5. Aortic atherosclerosis.  One vessel coronary atherosclerosis.   Multiple myeloma ([place])  02/12/2017 Bone Marrow Biopsy   The marrow was variably cellular with large peritrabecular aggregates of kappa restricted plasma cells (66% by aspirate, 30% by Cd138). Cytogenetics +11.    03/01/2017 - 06/29/2017 Chemotherapy   RVD    05/26/2017 Bone Marrow Biopsy   Performed at [facility]:  Plasma cell myeloma in a 30% cellularmarrow with decreased trilineage hematopoiesis and 42% kappalight chain restricted plasma cells on the aspirate smears andlarge aggregates on the core biopsy.    07/12/2017 - 09/01/2017 Chemotherapy   3 cycles of carfizolmib/cyclophosphamide/dexamethasone     10/08/2017 Bone Marrow Transplant   Autotransplant at [facility]: Cancer Staging Multiple myeloma [facility]) Staging form: Plasma Cell Myeloma and Plasma Cell Disorders, AJCC 8th Edition - Clinical stage from 05/03/2017: Beta-2-microglobulin (mg/L): 3.5, Albumin (g/dL): 3.4, ISS: Stage II, High-risk cytogenetics: Absent, LDH: Not assessed - Signed by Baird Cancer, PA-C on 05/03/2017    INTERVAL HISTORY:  Mr. Hobbins 73 y.o. male seen for follow-up of multiple myeloma.  He is tolerating carfilzomib very well.  Appetite is 100%.  Energy levels are 75%.  Numbness in the toes has been stable.  Denies any fevers or night sweats, weight loss.  Denies any nausea, vomiting, diarrhea or constipation.  Denies ER visits and hospitalizations.  No cough or  hemoptysis reported.   REVIEW OF SYSTEMS:  Review of Systems  Neurological: Positive for numbness.  All other systems reviewed and are negative.    PAST MEDICAL/SURGICAL HISTORY:  Past Medical History:  Diagnosis Date  . Anxiety   . Bone metastases ([place]) 09/10/2016  . Breast  cancer ([place]) 2011   Stave IV breast cancer; radiation and tamoxifen  . Breast cancer, male ([place])    Stave IV breast cancer; radiation and tamoxifen  Overview:  Left breast ca with mets bone  Overview:  METS TO BONE  . GERD (gastroesophageal reflux disease)   . Hypertension   . Macular degeneration   . Multiple myeloma ([place]) 02/18/2017  . Peripheral neuropathy    Past Surgical History:  Procedure Laterality Date  . BACK SURGERY    . CATARACT EXTRACTION W/PHACO Left 02/03/2019   Procedure: CATARACT EXTRACTION PHACO AND INTRAOCULAR LENS PLACEMENT ([place]);  Surgeon: Baruch Goldmann, MD;  Location: [facility];  Service: Ophthalmology;  Laterality: Left;  CDE: 15.89  . HERNIA REPAIR    . PORTACATH PLACEMENT Left 06/10/2018   Procedure: INSERTION PORT-A-CATH;  Surgeon: Virl Cagey, MD;  Location: [facility];  Service: General;  Laterality: Left;  . right arm surgery       SOCIAL HISTORY:  Social History   Socioeconomic History  . Marital status: Married    Spouse name: Not on file  . Number of children: 3  . Years of education: Not on file  . Highest education level: Not on file  Occupational History  . Not on file  Social Needs  . Financial resource strain: Not on file  . Food insecurity    Worry: Not on file    Inability: Not on file  . Transportation needs    Medical: Not on file    Non-medical: Not on file  Tobacco Use  . Smoking status: Never Smoker  . Smokeless tobacco: Former Network engineer and Sexual Activity  . Alcohol use: Yes    Alcohol/week: 24.0 standard drinks    Types: 24 Cans of beer per week  . Drug use: No  . Sexual activity: Not on file  Lifestyle  . Physical activity    Days per week: Not on file    Minutes per session: Not on file  . Stress: Not on file  Relationships  . Social Herbalist on phone: Not on file    Gets together: Not on file    Attends religious service: Not on file    Active member of club or organization: Not on file     Attends meetings of clubs or organizations: Not on file    Relationship status: Not on file  . Intimate partner violence    Fear of current or ex partner: Not on file    Emotionally abused: Not on file    Physically abused: Not on file    Forced sexual activity: Not on file  Other Topics Concern  . Not on file  Social History Narrative  . Not on file    FAMILY HISTORY:  Family History  Problem Relation Age of Onset  . Stroke Mother   . Cancer Maternal Aunt        cancer NOS; died in her 51s  . Lung cancer Maternal Uncle        smoker    CURRENT MEDICATIONS:  Outpatient Encounter Medications as of 07/19/2019  Medication Sig Note  . acyclovir (ZOVIRAX) 800 MG tablet Take 1  tablet (800 mg total) by mouth 2 (two) times daily.   Marland Kitchen ALPRAZolam (XANAX) 0.5 MG tablet TAKE 1 TABLET BY MOUTH AT BEDTIME AS NEEDED FOR ANXIETY OR SLEEP   . amLODipine (NORVASC) 5 MG tablet Take 1 tablet (5 mg total) by mouth daily.   Marland Kitchen aspirin EC 81 MG tablet Take 81 mg by mouth daily.   . calcium-vitamin D (OSCAL WITH D) 500-200 MG-UNIT TABS tablet Take 2 tablets by mouth daily. (Patient taking differently: Take 1 tablet by mouth 2 (two) times daily. )   . CARFILZOMIB IV Inject into the vein.   . Cholecalciferol (VITAMIN D) 50 MCG (2000 UT) CAPS Take 4,000 Units by mouth 2 (two) times daily.   Marland Kitchen denosumab (XGEVA) 120 MG/1.7ML SOLN injection Inject 120 mg into the skin once. 01/18/2019: [place]   . folic acid (FOLVITE) 1 MG tablet TAKE 1 TABLET BY MOUTH DAILY   . gabapentin (NEURONTIN) 300 MG capsule Take 1 capsule (300 mg total) by mouth 2 (two) times daily. Take two times a day, then increase to three times a day in a few weeks. (Patient taking differently: Take 300 mg by mouth 2 (two) times daily. )   . hydrALAZINE (APRESOLINE) 50 MG tablet Take 1 tablet (50 mg total) by mouth every 8 (eight) hours. (Patient taking differently: Take 50 mg by mouth 2 (two) times daily. )   . loratadine (CLARITIN) 10  MG tablet Take 1 tablet (10 mg total) by mouth daily.   . metoprolol succinate (TOPROL-XL) 100 MG 24 hr tablet Take 100 mg by mouth daily.    . Multiple Vitamin (MULTI-VITAMIN) tablet Take by mouth.   . Multiple Vitamin (MULTIVITAMIN WITH MINERALS) TABS tablet Take 1 tablet by mouth daily.   . ondansetron (ZOFRAN) 8 MG tablet TAKE ONE TABLET BY MOUTH EVERY 8 HOURS AS NEEDED   . ondansetron (ZOFRAN) 8 MG tablet Take 1 tablet (8 mg total) by mouth every 8 (eight) hours as needed for nausea or vomiting.   . pantoprazole (PROTONIX) 40 MG tablet Take 40 mg by mouth every other day.    . polyethylene glycol (MIRALAX / GLYCOLAX) packet Take 17 g by mouth daily as needed for mild constipation.   . prochlorperazine (COMPAZINE) 10 MG tablet Take 1 tablet (10 mg total) by mouth every 6 (six) hours as needed for nausea or vomiting.   . pseudoephedrine-guaifenesin (MUCINEX D) 60-600 MG 12 hr tablet Take 1 tablet by mouth 2 (two) times daily as needed for congestion.    . tamoxifen (NOLVADEX) 20 MG tablet Take 1 tablet (20 mg total) by mouth daily.   . vitamin B-12 (CYANOCOBALAMIN) 1000 MCG tablet Take 1,000 mcg by mouth daily.    Facility-Administered Encounter Medications as of 07/19/2019  Medication  . heparin lock flush 100 unit/mL  . sodium chloride flush (NS) 0.9 % injection 10 mL  . sodium chloride flush (NS) 0.9 % injection 10 mL  . sodium chloride flush 0.9 % injection    ALLERGIES:  Allergies  Allergen Reactions  . Cefepime     Suspected severe thrombocytopenia is a result of cefepime induced antigen platelet destruction     PHYSICAL EXAM:  ECOG Performance status: 1  Vitals:   07/19/19 1055  BP: (!) 148/68  Pulse: (!) 58  Resp: 18  Temp: (!) 97.1 F (36.2 C)  SpO2: 100%   Filed Weights   07/19/19 1055  Weight: 214 lb (97.1 kg)    Physical Exam Constitutional:  Appearance: Normal appearance.  HENT:     Head: Normocephalic.     Nose: Nose normal.     Mouth/Throat:      Mouth: Mucous membranes are moist.     Pharynx: Oropharynx is clear.  Eyes:     Extraocular Movements: Extraocular movements intact.     Conjunctiva/sclera: Conjunctivae normal.  Neck:     Musculoskeletal: Normal range of motion.  Cardiovascular:     Rate and Rhythm: Normal rate and regular rhythm.     Pulses: Normal pulses.     Heart sounds: Normal heart sounds.  Pulmonary:     Effort: Pulmonary effort is normal.     Breath sounds: Normal breath sounds.  Abdominal:     General: Bowel sounds are normal.     Palpations: Abdomen is soft.  Musculoskeletal: Normal range of motion.  Skin:    General: Skin is warm and dry.  Neurological:     General: No focal deficit present.     Mental Status: He is alert and oriented to person, place, and time. Mental status is at baseline.  Psychiatric:        Mood and Affect: Mood normal.        Behavior: Behavior normal.        Thought Content: Thought content normal.        Judgment: Judgment normal.      LABORATORY DATA:  I have reviewed the labs as listed.  CBC    Component Value Date/Time   WBC 6.2 07/19/2019 0947   RBC 2.81 (L) 07/19/2019 0947   HGB 10.3 (L) 07/19/2019 0947   HCT 31.5 (L) 07/19/2019 0947   PLT 172 07/19/2019 0947   MCV 112.1 (H) 07/19/2019 0947   MCH 36.7 (H) 07/19/2019 0947   MCHC 32.7 07/19/2019 0947   RDW 13.1 07/19/2019 0947   LYMPHSABS 1.2 07/19/2019 0947   MONOABS 0.6 07/19/2019 0947   EOSABS 0.3 07/19/2019 0947   BASOSABS 0.1 07/19/2019 0947   CMP Latest Ref Rng & Units 07/19/2019 07/05/2019 06/21/2019  Glucose 70 - 99 mg/dL 100(H) 116(H) 102(H)  BUN 8 - 23 mg/dL 15 17 24(H)  Creatinine 0.61 - 1.24 mg/dL 1.76(H) 1.61(H) 1.69(H)  Sodium 135 - 145 mmol/L 140 142 142  Potassium 3.5 - 5.1 mmol/L 4.6 4.0 3.8  Chloride 98 - 111 mmol/L 105 111 106  CO2 22 - 32 mmol/L _0 Calcium 8.9 - 10.3 mg/dL 9.2 8.3(L) 9.1  Total Protein 6.5 - 8.1 g/dL 5.8(L) 6.0(L) 5.9(L)  Total Bilirubin 0.3 - 1.2 mg/dL 0.5  0.3 0.5  Alkaline Phos 38 - 126 U/L 27(L) 30(L) 28(L)  AST 15 - 41 U/L _1 ALT 0 - 44 U/L _2 Radiology: I have reviewed his previous scans.  ASSESSMENT & PLAN:   Multiple myeloma ([place]) 1.  Stage II IgG kappa multiple myeloma (Dx February 2018): -RVD from 03/01/2017 through 06/29/2017 -Chemotherapy changed to 3 cycles of CCyD from 07/12/2017-04/2017 to obtain deeper response - Status post auto stem cell transplant on 10/08/2017 -Stable M spike after transplant, started on CCyD, cycle 1 on 02/14/2018, cycle 2 on 03/14/2018 -His M spike after 2 cycles was undetectable, when measured at [facility] last week.  He was recommended to have maintenance with carfilzomib 70 mg/m square on days 1 and 15 every 28 days by Dr. Norma Fredrickson. -He received his first carfilzomib 42m/m2 on 04/20/2018, developed fever with chills 3 hours later with weakness  in the extremities.  He was admitted to the hospital, received 1 unit of blood transfusion.  Blood cultures turned out to be negative.  He was febrile after hospitalization and was sent home. - Subsequently he tolerated carfilzomib and smaller doses very well.  We have increased his doses to 60 mg/m on 09/01/2018 and he tolerated 4 doses very well with the addition of dexamethasone. - Carfilzomib dose was increased to 70 mg per metered square on 10/27/2018.   -He was hospitalized on 11/29/2018 through 12/05/2018 with bronchitis, positive for parainfluenza, acute renal failure and severe thrombocytopenia.  His platelet count went down to 9.  We have given IVIG 1 g/kg x 2 doses.  Platelet count has slowly improved. - Review of literature showed carfilzomib can cause dose-related thrombotic microangiopathy. -Carfilzomib restarted at reduced dose of 35 mg per metered square on 01/05/2019. -We reviewed myeloma panel from 07/15/2019.  M spike was 0.1 g/dL.  Free light chain ratio has improved to 2.85 from 7.3 previously.  Kappa light chains have improved to 28.2 from  19.7 previously. - He had a portogram done today as it was not giving blood return.  Fibrin sheath at the tip was noted.  We had instilled Cathflo. -We will continue carfilzomib every 2 weeks.  He will have labs in 4 weeks and see me in 6 weeks.  2.  Metastatic left breast cancer to the bones: -Diagnosed in 2011.  He status post lumpectomy.  He will continue tamoxifen.  3.  Peripheral neuropathy: -His neuropathy has been stable.  He will continue gabapentin 300 mg twice daily.  4.  ID prophylaxis: -He will continue acyclovir 400 mg twice daily.  5.  Bone strengthening: -Continue denosumab.  Continue calcium supplements.  6.  Macrocytic anemia: -Etiology is stage III CKD.  Last Feraheme was on 04/27/2019. -He is receiving Procrit 20,000 units as needed. hs.     Total time spent is 25 minutes with more than 50% of the time spent face-to-face discussing myeloma labs, further treatment plan, counseling and coordination of care.  Orders placed this encounter:  Orders Placed This Encounter  Procedures  . CBC with Differential/Platelet  . Comprehensive metabolic panel  . Protein electrophoresis, serum  . Kappa/lambda light chains  . Lactate dehydrogenase      Derek Jack, MD [place] [phone]

## 2019-07-19 NOTE — Assessment & Plan Note (Signed)
1.  Stage II IgG kappa multiple myeloma (Dx February 2018): -RVD from 03/01/2017 through 06/29/2017 -Chemotherapy changed to 3 cycles of CCyD from 07/12/2017-04/2017 to obtain deeper response - Status post auto stem cell transplant on 10/08/2017 -Stable M spike after transplant, started on CCyD, cycle 1 on 02/14/2018, cycle 2 on 03/14/2018 -His M spike after 2 cycles was undetectable, when measured at [facility] last week.  He was recommended to have maintenance with carfilzomib 70 mg/m square on days 1 and 15 every 28 days by Dr. Norma Fredrickson. -He received his first carfilzomib '70mg'$ /m2 on 04/20/2018, developed fever with chills 3 hours later with weakness in the extremities.  He was admitted to the hospital, received 1 unit of blood transfusion.  Blood cultures turned out to be negative.  He was febrile after hospitalization and was sent home. - Subsequently he tolerated carfilzomib and smaller doses very well.  We have increased his doses to 60 mg/m on 09/01/2018 and he tolerated 4 doses very well with the addition of dexamethasone. - Carfilzomib dose was increased to 70 mg per metered square on 10/27/2018.   -He was hospitalized on 11/29/2018 through 12/05/2018 with bronchitis, positive for parainfluenza, acute renal failure and severe thrombocytopenia.  His platelet count went down to 9.  We have given IVIG 1 g/kg x 2 doses.  Platelet count has slowly improved. - Review of literature showed carfilzomib can cause dose-related thrombotic microangiopathy. -Carfilzomib restarted at reduced dose of 35 mg per metered square on 01/05/2019. -We reviewed myeloma panel from 07/15/2019.  M spike was 0.1 g/dL.  Free light chain ratio has improved to 2.85 from 7.3 previously.  Kappa light chains have improved to 28.2 from 19.7 previously. - He had a portogram done today as it was not giving blood return.  Fibrin sheath at the tip was noted.  We had instilled Cathflo. -We will continue carfilzomib every 2 weeks.  He will have  labs in 4 weeks and see me in 6 weeks.  2.  Metastatic left breast cancer to the bones: -Diagnosed in 2011.  He status post lumpectomy.  He will continue tamoxifen.  3.  Peripheral neuropathy: -His neuropathy has been stable.  He will continue gabapentin 300 mg twice daily.  4.  ID prophylaxis: -He will continue acyclovir 400 mg twice daily.  5.  Bone strengthening: -Continue denosumab.  Continue calcium supplements.  6.  Macrocytic anemia: -Etiology is stage III CKD.  Last Feraheme was on 04/27/2019. -He is receiving Procrit 20,000 units as needed. hs.

## 2019-08-03 ENCOUNTER — Ambulatory Visit (HOSPITAL_COMMUNITY): Payer: Medicare Other

## 2019-08-03 ENCOUNTER — Other Ambulatory Visit: Payer: Self-pay

## 2019-08-03 ENCOUNTER — Inpatient Hospital Stay (HOSPITAL_COMMUNITY): Payer: Medicare Other

## 2019-08-03 ENCOUNTER — Inpatient Hospital Stay (HOSPITAL_COMMUNITY): Payer: Medicare Other | Attending: Hematology

## 2019-08-03 ENCOUNTER — Other Ambulatory Visit (HOSPITAL_COMMUNITY): Payer: Medicare Other

## 2019-08-03 VITALS — BP 137/63 | HR 61 | Temp 97.8°F | Resp 18 | Wt 214.8 lb

## 2019-08-03 DIAGNOSIS — Z5111 Encounter for antineoplastic chemotherapy: Secondary | ICD-10-CM | POA: Diagnosis not present

## 2019-08-03 DIAGNOSIS — C9000 Multiple myeloma not having achieved remission: Secondary | ICD-10-CM | POA: Diagnosis not present

## 2019-08-03 LAB — COMPREHENSIVE METABOLIC PANEL
ALT: 18 U/L (ref 0–44)
AST: 17 U/L (ref 15–41)
Albumin: 3.8 g/dL (ref 3.5–5.0)
Alkaline Phosphatase: 28 U/L — ABNORMAL LOW (ref 38–126)
Anion gap: 6 (ref 5–15)
BUN: 20 mg/dL (ref 8–23)
CO2: 24 mmol/L (ref 22–32)
Calcium: 8.7 mg/dL — ABNORMAL LOW (ref 8.9–10.3)
Chloride: 108 mmol/L (ref 98–111)
Creatinine, Ser: 1.62 mg/dL — ABNORMAL HIGH (ref 0.61–1.24)
GFR calc Af Amer: 49 mL/min — ABNORMAL LOW (ref >60)
GFR calc non Af Amer: 42 mL/min — ABNORMAL LOW (ref >60)
Glucose, Bld: 115 mg/dL — ABNORMAL HIGH (ref 70–99)
Potassium: 3.7 mmol/L (ref 3.5–5.1)
Sodium: 138 mmol/L (ref 135–145)
Total Bilirubin: 0.4 mg/dL (ref 0.3–1.2)
Total Protein: 5.8 g/dL — ABNORMAL LOW (ref 6.5–8.1)

## 2019-08-03 LAB — CBC WITH DIFFERENTIAL/PLATELET
Abs Immature Granulocytes: 0.01 10*3/uL (ref 0.00–0.07)
Basophils Absolute: 0.1 10*3/uL (ref 0.0–0.1)
Basophils Relative: 1 %
Eosinophils Absolute: 0.3 10*3/uL (ref 0.0–0.5)
Eosinophils Relative: 4 %
HCT: 32 % — ABNORMAL LOW (ref 39.0–52.0)
Hemoglobin: 10.7 g/dL — ABNORMAL LOW (ref 13.0–17.0)
Immature Granulocytes: 0 %
Lymphocytes Relative: 15 %
Lymphs Abs: 1.1 10*3/uL (ref 0.7–4.0)
MCH: 36.8 pg — ABNORMAL HIGH (ref 26.0–34.0)
MCHC: 33.4 g/dL (ref 30.0–36.0)
MCV: 110 fL — ABNORMAL HIGH (ref 80.0–100.0)
Monocytes Absolute: 0.6 10*3/uL (ref 0.1–1.0)
Monocytes Relative: 8 %
Neutro Abs: 5 10*3/uL (ref 1.7–7.7)
Neutrophils Relative %: 72 %
Platelets: 175 10*3/uL (ref 150–400)
RBC: 2.91 MIL/uL — ABNORMAL LOW (ref 4.22–5.81)
RDW: 12.8 % (ref 11.5–15.5)
WBC: 7 10*3/uL (ref 4.0–10.5)
nRBC: 0 % (ref 0.0–0.2)

## 2019-08-03 MED ORDER — DEXTROSE 5 % IV SOLN
34.0000 mg/m2 | Freq: Once | INTRAVENOUS | Status: AC
  Administered 2019-08-03: 70 mg via INTRAVENOUS
  Filled 2019-08-03: qty 30

## 2019-08-03 MED ORDER — SODIUM CHLORIDE 0.9 % IV SOLN
20.0000 mg | Freq: Once | INTRAVENOUS | Status: AC
  Administered 2019-08-03: 12:00:00 20 mg via INTRAVENOUS
  Filled 2019-08-03: qty 20

## 2019-08-03 MED ORDER — HEPARIN SOD (PORK) LOCK FLUSH 100 UNIT/ML IV SOLN
500.0000 [IU] | Freq: Once | INTRAVENOUS | Status: AC | PRN
  Administered 2019-08-03: 14:00:00 500 [IU]

## 2019-08-03 MED ORDER — SODIUM CHLORIDE 0.9 % IV SOLN
Freq: Once | INTRAVENOUS | Status: AC
  Administered 2019-08-03: 13:00:00 via INTRAVENOUS

## 2019-08-03 MED ORDER — SODIUM CHLORIDE 0.9% FLUSH
10.0000 mL | INTRAVENOUS | Status: DC | PRN
  Administered 2019-08-03 (×2): 10 mL
  Filled 2019-08-03 (×2): qty 10

## 2019-08-03 MED ORDER — SODIUM CHLORIDE 0.9 % IV SOLN
Freq: Once | INTRAVENOUS | Status: AC
  Administered 2019-08-03: 11:00:00 via INTRAVENOUS

## 2019-08-03 MED ORDER — ACETAMINOPHEN 325 MG PO TABS
650.0000 mg | ORAL_TABLET | Freq: Once | ORAL | Status: AC
  Administered 2019-08-03: 650 mg via ORAL
  Filled 2019-08-03: qty 2

## 2019-08-03 NOTE — Patient Instructions (Signed)
[facility] Discharge Instructions for Patients Receiving Chemotherapy  Today you received the following chemotherapy agents   To help prevent nausea and vomiting after your treatment, we encourage you to take your nausea medication   If you develop nausea and vomiting that is not controlled by your nausea medication, call the clinic.   BELOW ARE SYMPTOMS THAT SHOULD BE REPORTED IMMEDIATELY:  *FEVER GREATER THAN 100.5 F  *CHILLS WITH OR WITHOUT FEVER  NAUSEA AND VOMITING THAT IS NOT CONTROLLED WITH YOUR NAUSEA MEDICATION  *UNUSUAL SHORTNESS OF BREATH  *UNUSUAL BRUISING OR BLEEDING  TENDERNESS IN MOUTH AND THROAT WITH OR WITHOUT PRESENCE OF ULCERS  *URINARY PROBLEMS  *BOWEL PROBLEMS  UNUSUAL RASH Items with * indicate a potential emergency and should be followed up as soon as possible.  Feel free to call the clinic should you have any questions or concerns. The clinic phone number is [phone].  Please show the CHEMO ALERT CARD at check-in to the Emergency Department and triage nurse.   

## 2019-08-03 NOTE — Progress Notes (Signed)
Pt presents today for treatment only. VS within parameters for tx. Labs pending. MAR reviewed. Per RNester NP proceed with tx today . Alteplase inserted last visit and dye study performed. No blood return noted. Per NP okay to treat. Documented. See flow sheet.    Labs reviewed with RNester NP. Creat 1.62. Proceed with treatment per NP.   Treatment given today per MD orders. Tolerated infusion without adverse affects. Vital signs stable. No complaints at this time. Discharged from clinic ambulatory. F/U with [facility] as scheduled.

## 2019-08-04 ENCOUNTER — Inpatient Hospital Stay (HOSPITAL_COMMUNITY): Payer: Medicare Other

## 2019-08-04 VITALS — BP 127/72 | HR 66 | Temp 97.7°F | Resp 18

## 2019-08-04 DIAGNOSIS — C50122 Malignant neoplasm of central portion of left male breast: Secondary | ICD-10-CM

## 2019-08-04 DIAGNOSIS — C7951 Secondary malignant neoplasm of bone: Secondary | ICD-10-CM

## 2019-08-04 DIAGNOSIS — C9000 Multiple myeloma not having achieved remission: Secondary | ICD-10-CM

## 2019-08-04 DIAGNOSIS — Z5111 Encounter for antineoplastic chemotherapy: Secondary | ICD-10-CM | POA: Diagnosis not present

## 2019-08-04 MED ORDER — DENOSUMAB 120 MG/1.7ML ~~LOC~~ SOLN
SUBCUTANEOUS | Status: AC
  Filled 2019-08-04: qty 1.7

## 2019-08-04 MED ORDER — DENOSUMAB 120 MG/1.7ML ~~LOC~~ SOLN
120.0000 mg | Freq: Once | SUBCUTANEOUS | Status: AC
  Administered 2019-08-04: 120 mg via SUBCUTANEOUS

## 2019-08-04 NOTE — Patient Instructions (Signed)
[facility] at [facility]  Discharge Instructions:   _______________________________________________________________  Thank you for choosing [facility] at [facility] to provide your oncology and hematology care.  To afford each patient quality time with our providers, please arrive at least 15 minutes before your scheduled appointment.  You need to re-schedule your appointment if you arrive 10 or more minutes late.  We strive to give you quality time with our providers, and arriving late affects you and other patients whose appointments are after yours.  Also, if you no show three or more times for appointments you may be dismissed from the clinic.  Again, thank you for choosing [facility] at [facility]. Our hope is that these requests will allow you access to exceptional care and in a timely manner. _______________________________________________________________  If you have questions after your visit, please contact our office at [phone] between the hours of 8:30 a.m. and 5:00 p.m. Voicemails left after 4:30 p.m. will not be returned until the following business day. _______________________________________________________________  For prescription refill requests, have your pharmacy contact our office. _______________________________________________________________  Recommendations made by the consultant and any test results will be sent to your referring physician. _______________________________________________________________ 

## 2019-08-04 NOTE — Progress Notes (Signed)
Johnny Lucas presents today for injection per MD orders. Pt states he is taking Oscal with Vit D as prescribed.  XGeva administered SQ in right Upper Arm. Administration without incident. Patient tolerated well. Vital signs stable. No complaints at this time. Discharged from clinic ambulatory. F/U with [facility] as scheduled.

## 2019-08-17 ENCOUNTER — Inpatient Hospital Stay (HOSPITAL_COMMUNITY): Payer: Medicare Other

## 2019-08-17 ENCOUNTER — Encounter (HOSPITAL_COMMUNITY): Payer: Self-pay

## 2019-08-17 ENCOUNTER — Other Ambulatory Visit: Payer: Self-pay

## 2019-08-17 VITALS — BP 131/62 | HR 65 | Temp 97.6°F | Resp 18 | Wt 217.2 lb

## 2019-08-17 DIAGNOSIS — C9000 Multiple myeloma not having achieved remission: Secondary | ICD-10-CM

## 2019-08-17 DIAGNOSIS — Z5111 Encounter for antineoplastic chemotherapy: Secondary | ICD-10-CM | POA: Diagnosis not present

## 2019-08-17 LAB — CBC WITH DIFFERENTIAL/PLATELET
Abs Immature Granulocytes: 0.01 10*3/uL (ref 0.00–0.07)
Basophils Absolute: 0.1 10*3/uL (ref 0.0–0.1)
Basophils Relative: 1 %
Eosinophils Absolute: 0.3 10*3/uL (ref 0.0–0.5)
Eosinophils Relative: 4 %
HCT: 31.5 % — ABNORMAL LOW (ref 39.0–52.0)
Hemoglobin: 10.5 g/dL — ABNORMAL LOW (ref 13.0–17.0)
Immature Granulocytes: 0 %
Lymphocytes Relative: 20 %
Lymphs Abs: 1.3 10*3/uL (ref 0.7–4.0)
MCH: 36.3 pg — ABNORMAL HIGH (ref 26.0–34.0)
MCHC: 33.3 g/dL (ref 30.0–36.0)
MCV: 109 fL — ABNORMAL HIGH (ref 80.0–100.0)
Monocytes Absolute: 0.7 10*3/uL (ref 0.1–1.0)
Monocytes Relative: 11 %
Neutro Abs: 4.1 10*3/uL (ref 1.7–7.7)
Neutrophils Relative %: 64 %
Platelets: 173 10*3/uL (ref 150–400)
RBC: 2.89 MIL/uL — ABNORMAL LOW (ref 4.22–5.81)
RDW: 12.9 % (ref 11.5–15.5)
WBC: 6.4 10*3/uL (ref 4.0–10.5)
nRBC: 0 % (ref 0.0–0.2)

## 2019-08-17 LAB — LACTATE DEHYDROGENASE: LDH: 133 U/L (ref 98–192)

## 2019-08-17 LAB — COMPREHENSIVE METABOLIC PANEL
ALT: 17 U/L (ref 0–44)
AST: 16 U/L (ref 15–41)
Albumin: 3.6 g/dL (ref 3.5–5.0)
Alkaline Phosphatase: 29 U/L — ABNORMAL LOW (ref 38–126)
Anion gap: 6 (ref 5–15)
BUN: 19 mg/dL (ref 8–23)
CO2: 25 mmol/L (ref 22–32)
Calcium: 8.9 mg/dL (ref 8.9–10.3)
Chloride: 106 mmol/L (ref 98–111)
Creatinine, Ser: 1.66 mg/dL — ABNORMAL HIGH (ref 0.61–1.24)
GFR calc Af Amer: 47 mL/min — ABNORMAL LOW (ref >60)
GFR calc non Af Amer: 41 mL/min — ABNORMAL LOW (ref >60)
Glucose, Bld: 100 mg/dL — ABNORMAL HIGH (ref 70–99)
Potassium: 4.1 mmol/L (ref 3.5–5.1)
Sodium: 137 mmol/L (ref 135–145)
Total Bilirubin: 0.4 mg/dL (ref 0.3–1.2)
Total Protein: 5.7 g/dL — ABNORMAL LOW (ref 6.5–8.1)

## 2019-08-17 MED ORDER — SODIUM CHLORIDE 0.9 % IV SOLN
Freq: Once | INTRAVENOUS | Status: AC
  Administered 2019-08-17: 09:00:00 via INTRAVENOUS

## 2019-08-17 MED ORDER — SODIUM CHLORIDE 0.9% FLUSH
10.0000 mL | INTRAVENOUS | Status: DC | PRN
  Administered 2019-08-17: 10 mL
  Filled 2019-08-17: qty 10

## 2019-08-17 MED ORDER — SODIUM CHLORIDE 0.9 % IV SOLN
20.0000 mg | Freq: Once | INTRAVENOUS | Status: AC
  Administered 2019-08-17: 09:00:00 20 mg via INTRAVENOUS
  Filled 2019-08-17: qty 20

## 2019-08-17 MED ORDER — ACETAMINOPHEN 325 MG PO TABS
650.0000 mg | ORAL_TABLET | Freq: Once | ORAL | Status: AC
  Administered 2019-08-17: 09:00:00 650 mg via ORAL
  Filled 2019-08-17: qty 2

## 2019-08-17 MED ORDER — SODIUM CHLORIDE 0.9 % IV SOLN
Freq: Once | INTRAVENOUS | Status: AC
  Administered 2019-08-17: 11:00:00 via INTRAVENOUS

## 2019-08-17 MED ORDER — HEPARIN SOD (PORK) LOCK FLUSH 100 UNIT/ML IV SOLN
500.0000 [IU] | Freq: Once | INTRAVENOUS | Status: AC | PRN
  Administered 2019-08-17: 500 [IU]

## 2019-08-17 MED ORDER — DEXTROSE 5 % IV SOLN
34.0000 mg/m2 | Freq: Once | INTRAVENOUS | Status: AC
  Administered 2019-08-17: 70 mg via INTRAVENOUS
  Filled 2019-08-17: qty 30

## 2019-08-17 NOTE — Progress Notes (Signed)
2620 Labs reviewed with Dr. Delton Coombes and pt approved for Kyprolis infusion today per MD                                           Johnny Lucas tolerated Kyprolis infusion well without complaints or incident. Hgb 10.5 today. Epogen injection held per parameters. VSS upon discharge. Pt discharged self ambulatory in satisfactory condition

## 2019-08-17 NOTE — Patient Instructions (Signed)
[facility] Discharge Instructions for Patients Receiving Chemotherapy   Beginning January 23rd 2017 lab work for the Cancer Center will be done in the  Main lab at  on 1st floor. If you have a lab appointment with the Cancer Center please come in thru the  Main Entrance and check in at the main information desk   Today you received the following chemotherapy agents Kyprolis. Follow-up as scheduled. Call clinic for any questions or concerns  To help prevent nausea and vomiting after your treatment, we encourage you to take your nausea medication   If you develop nausea and vomiting, or diarrhea that is not controlled by your medication, call the clinic.  The clinic phone number is [phone]. Office hours are Monday-Friday 8:30am-5:00pm.  BELOW ARE SYMPTOMS THAT SHOULD BE REPORTED IMMEDIATELY:  *FEVER GREATER THAN 101.0 F  *CHILLS WITH OR WITHOUT FEVER  NAUSEA AND VOMITING THAT IS NOT CONTROLLED WITH YOUR NAUSEA MEDICATION  *UNUSUAL SHORTNESS OF BREATH  *UNUSUAL BRUISING OR BLEEDING  TENDERNESS IN MOUTH AND THROAT WITH OR WITHOUT PRESENCE OF ULCERS  *URINARY PROBLEMS  *BOWEL PROBLEMS  UNUSUAL RASH Items with * indicate a potential emergency and should be followed up as soon as possible. If you have an emergency after office hours please contact your primary care physician or go to the nearest emergency department.  Please call the clinic during office hours if you have any questions or concerns.   You may also contact the Patient Navigator at [phone] should you have any questions or need assistance in obtaining follow up care.      Resources For Cancer Patients and their Caregivers ? American Cancer Society: Can assist with transportation, wigs, general needs, runs Look Good Feel Better.        1-888-227-6333 ? Cancer Care: Provides financial assistance, online support groups, medication/co-pay assistance.  1-800-813-HOPE  (4673) ? [facility] Assists [place] cancer patients and their families through emotional , educational and financial support.  336-427-4357 ? [place] DSS Where to apply for food stamps, Medicaid and utility assistance. 336-342-1394 ? [facility]: Transportation to medical appointments. 336-347-2287 ? Social Security Administration: May apply for disability if have a Stage IV cancer. 336-342-7796 1-800-772-1213 ? [place] Aging, Disability and Transit Services: Assists with nutrition, care and transit needs. 336-349-2343         

## 2019-08-18 LAB — KAPPA/LAMBDA LIGHT CHAINS
Kappa free light chain: 32.9 mg/L — ABNORMAL HIGH (ref 3.3–19.4)
Kappa, lambda light chain ratio: 8.23 — ABNORMAL HIGH (ref 0.26–1.65)
Lambda free light chains: 4 mg/L — ABNORMAL LOW (ref 5.7–26.3)

## 2019-08-20 LAB — PROTEIN ELECTROPHORESIS, SERUM
A/G Ratio: 2.1 — ABNORMAL HIGH (ref 0.7–1.7)
Albumin ELP: 3.5 g/dL (ref 2.9–4.4)
Alpha-1-Globulin: 0.2 g/dL (ref 0.0–0.4)
Alpha-2-Globulin: 0.6 g/dL (ref 0.4–1.0)
Beta Globulin: 0.7 g/dL (ref 0.7–1.3)
Gamma Globulin: 0.2 g/dL — ABNORMAL LOW (ref 0.4–1.8)
Globulin, Total: 1.7 g/dL — ABNORMAL LOW (ref 2.2–3.9)
M-Spike, %: 0.1 g/dL — ABNORMAL HIGH
Total Protein ELP: 5.2 g/dL — ABNORMAL LOW (ref 6.0–8.5)

## 2019-08-24 ENCOUNTER — Other Ambulatory Visit (HOSPITAL_COMMUNITY): Payer: Self-pay | Admitting: Hematology

## 2019-08-29 ENCOUNTER — Other Ambulatory Visit (HOSPITAL_COMMUNITY): Payer: Self-pay | Admitting: Nurse Practitioner

## 2019-08-29 DIAGNOSIS — C9000 Multiple myeloma not having achieved remission: Secondary | ICD-10-CM

## 2019-08-30 ENCOUNTER — Other Ambulatory Visit: Payer: Self-pay

## 2019-08-30 ENCOUNTER — Inpatient Hospital Stay (HOSPITAL_COMMUNITY): Payer: Medicare Other | Attending: Hematology | Admitting: Hematology

## 2019-08-30 ENCOUNTER — Encounter (HOSPITAL_COMMUNITY): Payer: Self-pay | Admitting: Hematology

## 2019-08-30 ENCOUNTER — Inpatient Hospital Stay (HOSPITAL_COMMUNITY): Payer: Medicare Other

## 2019-08-30 VITALS — BP 122/68 | HR 62 | Temp 97.6°F | Resp 18

## 2019-08-30 VITALS — BP 135/75 | HR 67 | Temp 97.5°F | Resp 18 | Wt 218.8 lb

## 2019-08-30 DIAGNOSIS — D631 Anemia in chronic kidney disease: Secondary | ICD-10-CM | POA: Diagnosis not present

## 2019-08-30 DIAGNOSIS — C9000 Multiple myeloma not having achieved remission: Secondary | ICD-10-CM | POA: Diagnosis not present

## 2019-08-30 DIAGNOSIS — Z79810 Long term (current) use of selective estrogen receptor modulators (SERMs): Secondary | ICD-10-CM | POA: Insufficient documentation

## 2019-08-30 DIAGNOSIS — C7951 Secondary malignant neoplasm of bone: Secondary | ICD-10-CM | POA: Insufficient documentation

## 2019-08-30 DIAGNOSIS — Z23 Encounter for immunization: Secondary | ICD-10-CM | POA: Diagnosis not present

## 2019-08-30 DIAGNOSIS — C50922 Malignant neoplasm of unspecified site of left male breast: Secondary | ICD-10-CM | POA: Insufficient documentation

## 2019-08-30 DIAGNOSIS — N183 Chronic kidney disease, stage 3 (moderate): Secondary | ICD-10-CM | POA: Insufficient documentation

## 2019-08-30 DIAGNOSIS — G629 Polyneuropathy, unspecified: Secondary | ICD-10-CM | POA: Insufficient documentation

## 2019-08-30 DIAGNOSIS — Z79899 Other long term (current) drug therapy: Secondary | ICD-10-CM | POA: Insufficient documentation

## 2019-08-30 DIAGNOSIS — Z9484 Stem cells transplant status: Secondary | ICD-10-CM | POA: Diagnosis not present

## 2019-08-30 DIAGNOSIS — N179 Acute kidney failure, unspecified: Secondary | ICD-10-CM

## 2019-08-30 DIAGNOSIS — Z5111 Encounter for antineoplastic chemotherapy: Secondary | ICD-10-CM | POA: Diagnosis not present

## 2019-08-30 LAB — CBC WITH DIFFERENTIAL/PLATELET
Abs Immature Granulocytes: 0.01 10*3/uL (ref 0.00–0.07)
Basophils Absolute: 0.1 10*3/uL (ref 0.0–0.1)
Basophils Relative: 1 %
Eosinophils Absolute: 0.4 10*3/uL (ref 0.0–0.5)
Eosinophils Relative: 6 %
HCT: 32.3 % — ABNORMAL LOW (ref 39.0–52.0)
Hemoglobin: 10.5 g/dL — ABNORMAL LOW (ref 13.0–17.0)
Immature Granulocytes: 0 %
Lymphocytes Relative: 24 %
Lymphs Abs: 1.4 10*3/uL (ref 0.7–4.0)
MCH: 36.2 pg — ABNORMAL HIGH (ref 26.0–34.0)
MCHC: 32.5 g/dL (ref 30.0–36.0)
MCV: 111.4 fL — ABNORMAL HIGH (ref 80.0–100.0)
Monocytes Absolute: 0.6 10*3/uL (ref 0.1–1.0)
Monocytes Relative: 10 %
Neutro Abs: 3.5 10*3/uL (ref 1.7–7.7)
Neutrophils Relative %: 59 %
Platelets: 190 10*3/uL (ref 150–400)
RBC: 2.9 MIL/uL — ABNORMAL LOW (ref 4.22–5.81)
RDW: 13.2 % (ref 11.5–15.5)
WBC: 5.9 10*3/uL (ref 4.0–10.5)
nRBC: 0 % (ref 0.0–0.2)

## 2019-08-30 LAB — COMPREHENSIVE METABOLIC PANEL
ALT: 15 U/L (ref 0–44)
AST: 18 U/L (ref 15–41)
Albumin: 3.7 g/dL (ref 3.5–5.0)
Alkaline Phosphatase: 25 U/L — ABNORMAL LOW (ref 38–126)
Anion gap: 3 — ABNORMAL LOW (ref 5–15)
BUN: 19 mg/dL (ref 8–23)
CO2: 26 mmol/L (ref 22–32)
Calcium: 9.1 mg/dL (ref 8.9–10.3)
Chloride: 109 mmol/L (ref 98–111)
Creatinine, Ser: 1.68 mg/dL — ABNORMAL HIGH (ref 0.61–1.24)
GFR calc Af Amer: 47 mL/min — ABNORMAL LOW (ref >60)
GFR calc non Af Amer: 40 mL/min — ABNORMAL LOW (ref >60)
Glucose, Bld: 120 mg/dL — ABNORMAL HIGH (ref 70–99)
Potassium: 4.1 mmol/L (ref 3.5–5.1)
Sodium: 138 mmol/L (ref 135–145)
Total Bilirubin: 0.4 mg/dL (ref 0.3–1.2)
Total Protein: 5.7 g/dL — ABNORMAL LOW (ref 6.5–8.1)

## 2019-08-30 LAB — LACTATE DEHYDROGENASE: LDH: 134 U/L (ref 98–192)

## 2019-08-30 LAB — MAGNESIUM: Magnesium: 2 mg/dL (ref 1.7–2.4)

## 2019-08-30 MED ORDER — SODIUM CHLORIDE 0.9 % IV SOLN
20.0000 mg | Freq: Once | INTRAVENOUS | Status: AC
  Administered 2019-08-30: 20 mg via INTRAVENOUS
  Filled 2019-08-30: qty 20

## 2019-08-30 MED ORDER — SODIUM CHLORIDE 0.9% FLUSH
10.0000 mL | INTRAVENOUS | Status: DC | PRN
  Administered 2019-08-30: 10:00:00 10 mL
  Filled 2019-08-30: qty 10

## 2019-08-30 MED ORDER — SODIUM CHLORIDE 0.9 % IV SOLN
Freq: Once | INTRAVENOUS | Status: AC
  Administered 2019-08-30: 10:00:00 via INTRAVENOUS

## 2019-08-30 MED ORDER — ACETAMINOPHEN 325 MG PO TABS
650.0000 mg | ORAL_TABLET | Freq: Once | ORAL | Status: AC
  Administered 2019-08-30: 10:00:00 650 mg via ORAL
  Filled 2019-08-30: qty 2

## 2019-08-30 MED ORDER — HEPARIN SOD (PORK) LOCK FLUSH 100 UNIT/ML IV SOLN
500.0000 [IU] | Freq: Once | INTRAVENOUS | Status: AC | PRN
  Administered 2019-08-30: 13:00:00 500 [IU]

## 2019-08-30 MED ORDER — DEXTROSE 5 % IV SOLN
34.0000 mg/m2 | Freq: Once | INTRAVENOUS | Status: AC
  Administered 2019-08-30: 12:00:00 70 mg via INTRAVENOUS
  Filled 2019-08-30: qty 30

## 2019-08-30 MED ORDER — SODIUM CHLORIDE 0.9 % IV SOLN
Freq: Once | INTRAVENOUS | Status: AC
  Administered 2019-08-30: 12:00:00 via INTRAVENOUS

## 2019-08-30 NOTE — Progress Notes (Signed)
Pt presents today for treatment and f/u appointment with Dr. Delton Coombes.  MAR reviewed. Labs pending. VS within parameters for treatment.   Pt seen today by Dr. Delton Coombes. Creat 1.68 today. MD aware.   Pt 's HGB 10.5 today. Pt refused Procrit today.   Treatment given today per MD orders. Tolerated infusion without adverse affects. Vital signs stable. No complaints at this time. Discharged from clinic ambulatory. F/U with [facility] as scheduled.

## 2019-08-30 NOTE — Progress Notes (Signed)
08/30/19  Maintain today's dose at 70 mg  Dr Rhys Martini, PharmD

## 2019-08-30 NOTE — Patient Instructions (Signed)
[facility] Discharge Instructions for Patients Receiving Chemotherapy  Today you received the following chemotherapy agents   To help prevent nausea and vomiting after your treatment, we encourage you to take your nausea medication   If you develop nausea and vomiting that is not controlled by your nausea medication, call the clinic.   BELOW ARE SYMPTOMS THAT SHOULD BE REPORTED IMMEDIATELY:  *FEVER GREATER THAN 100.5 F  *CHILLS WITH OR WITHOUT FEVER  NAUSEA AND VOMITING THAT IS NOT CONTROLLED WITH YOUR NAUSEA MEDICATION  *UNUSUAL SHORTNESS OF BREATH  *UNUSUAL BRUISING OR BLEEDING  TENDERNESS IN MOUTH AND THROAT WITH OR WITHOUT PRESENCE OF ULCERS  *URINARY PROBLEMS  *BOWEL PROBLEMS  UNUSUAL RASH Items with * indicate a potential emergency and should be followed up as soon as possible.  Feel free to call the clinic should you have any questions or concerns. The clinic phone number is [phone].  Please show the CHEMO ALERT CARD at check-in to the Emergency Department and triage nurse.   

## 2019-08-30 NOTE — Progress Notes (Signed)
[address] [place], [place] [address]   CLINIC:  Medical Oncology/Hematology  PCP:  Johnny Percy, MD [place] [phone]   REASON FOR VISIT:  Follow-up for Multiple Myeloma  CURRENT THERAPY: Carfilzomib   BRIEF ONCOLOGIC HISTORY:  Oncology History  Breast cancer, male ([place])  12/31/2009 Initial Biopsy   Biopsy of L breast    12/31/2009 Pathology Results   Invasive ductal carcinoma, ER/PR+, HER 2 negative   12/31/2009 Imaging   Ultrasound showing a 2.43 x 1.85 x 3 cm hypoechoic spiculated mass in the 12 o clock L breast retroareolar region   01/01/2010 -  Anti-estrogen oral therapy   Tamoxifen 20 mg daily   01/06/2010 Imaging   Bone scan abnormal uptake in the diaphysis of the R humerus, abnormal in the R third, fifth and sixth ribs, lesion also noted in the sternum.   02/03/2010 Surgery   Rod placement and fixation of R humerus by Dr. [name]   02/05/2010 - 02/18/2010 Radiation Therapy   30Gy in 10 fractions of 3 Gy per fraction to R pathologic fracture   03/11/2010 -  Chemotherapy   Denosumab monthly, now every 3 months. Started at [facility]    06/09/2016 Imaging   Three hypermetabolic osseous lesions in the sternum, left ilium and right ilium, as discussed above, likely represent osseous metastases. At this time, these are not recognizable on the CT images. 2. No extra skeletal metastatic disease identified in the neck, chest, abdomen or pelvis.   10/13/2016 Progression   PET shows various new and enlarging osseous metastatic lesions with no definite extra osseous metastatic disease currently identified.    12/31/2016 Progression   1. Multifocal hypermetabolic osseous metastases throughout the axial and proximal appendicular skeleton, which are increased in size, number and metabolism since 10/13/2016 PET-CT. 2. 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 3. No additional sites of hypermetabolic metastatic disease. 4. Chronic right mastoid sinusitis. 5. Aortic atherosclerosis.  One vessel coronary atherosclerosis.   Multiple myeloma ([place])  02/12/2017 Bone Marrow Biopsy   The marrow was variably cellular with large peritrabecular aggregates of kappa restricted plasma cells (66% by aspirate, 30% by Cd138). Cytogenetics +11.    03/01/2017 - 06/29/2017 Chemotherapy   RVD    05/26/2017 Bone Marrow Biopsy   Performed at [facility]:  Plasma cell myeloma in a 30% cellularmarrow with decreased trilineage hematopoiesis and 42% kappalight chain restricted plasma cells on the aspirate smears andlarge aggregates on the core biopsy.    07/12/2017 - 09/01/2017 Chemotherapy   3 cycles of carfizolmib/cyclophosphamide/dexamethasone     10/08/2017 Bone Marrow Transplant   Autotransplant at [facility]: Cancer Staging Multiple myeloma [facility]) Staging form: Plasma Cell Myeloma and Plasma Cell Disorders, AJCC 8th Edition - Clinical stage from 05/03/2017: Beta-2-microglobulin (mg/L): 3.5, Albumin (g/dL): 3.4, ISS: Stage II, High-risk cytogenetics: Absent, LDH: Not assessed - Signed by Baird Cancer, PA-C on 05/03/2017    INTERVAL HISTORY:  Johnny Lucas 73 y.o. male seen for follow-up of multiple myeloma.  He is tolerating carfilzomib very well.  Appetite is 100%.  Energy levels are 75%.  Denies any signs or symptoms of PND or orthopnea.  No pain is reported.  Numbness in the feet has been stable.  Denies any nausea, vomiting or diarrhea or constipation.  No fevers  or chills noted.   REVIEW OF SYSTEMS:  Review of Systems  Neurological: Positive for numbness.  All other systems reviewed and are negative.    PAST MEDICAL/SURGICAL HISTORY:  Past Medical History:  Diagnosis Date  . Anxiety   . Bone metastases ([place]) 09/10/2016  . Breast cancer ([place]) 2011    Stave IV breast cancer; radiation and tamoxifen  . Breast cancer, male ([place])    Stave IV breast cancer; radiation and tamoxifen  Overview:  Left breast ca with mets bone  Overview:  METS TO BONE  . GERD (gastroesophageal reflux disease)   . Hypertension   . Macular degeneration   . Multiple myeloma ([place]) 02/18/2017  . Peripheral neuropathy    Past Surgical History:  Procedure Laterality Date  . BACK SURGERY    . CATARACT EXTRACTION W/PHACO Left 02/03/2019   Procedure: CATARACT EXTRACTION PHACO AND INTRAOCULAR LENS PLACEMENT ([place]);  Surgeon: Baruch Goldmann, MD;  Location: [facility];  Service: Ophthalmology;  Laterality: Left;  CDE: 15.89  . HERNIA REPAIR    . PORTACATH PLACEMENT Left 06/10/2018   Procedure: INSERTION PORT-A-CATH;  Surgeon: Virl Cagey, MD;  Location: [facility] ORS;  Service: General;  Laterality: Left;  . right arm surgery       SOCIAL HISTORY:  Social History   Socioeconomic History  . Marital status: Married    Spouse name: Not on file  . Number of children: 3  . Years of education: Not on file  . Highest education level: Not on file  Occupational History  . Not on file  Social Needs  . Financial resource strain: Not on file  . Food insecurity    Worry: Not on file    Inability: Not on file  . Transportation needs    Medical: Not on file    Non-medical: Not on file  Tobacco Use  . Smoking status: Never Smoker  . Smokeless tobacco: Former Network engineer and Sexual Activity  . Alcohol use: Yes    Alcohol/week: 24.0 standard drinks    Types: 24 Cans of beer per week  . Drug use: No  . Sexual activity: Not on file  Lifestyle  . Physical activity    Days per week: Not on file    Minutes per session: Not on file  . Stress: Not on file  Relationships  . Social Herbalist on phone: Not on file    Gets together: Not on file    Attends religious service: Not on file    Active member of club or organization: Not on file    Attends meetings of  clubs or organizations: Not on file    Relationship status: Not on file  . Intimate partner violence    Fear of current or ex partner: Not on file    Emotionally abused: Not on file    Physically abused: Not on file    Forced sexual activity: Not on file  Other Topics Concern  . Not on file  Social History Narrative  . Not on file    FAMILY HISTORY:  Family History  Problem Relation Age of Onset  . Stroke Mother   . Cancer Maternal Aunt        cancer NOS; died in her 90s  . Lung cancer Maternal Uncle        smoker    CURRENT MEDICATIONS:  Outpatient Encounter Medications as of 08/30/2019  Medication Sig Note  . ALPRAZolam (XANAX) 0.5 MG tablet TAKE  1 TABLET BY MOUTH AT BEDTIME AS NEEDED FOR ANXIETY OR SLEEP   . amLODipine (NORVASC) 5 MG tablet Take 1 tablet (5 mg total) by mouth daily.   Marland Kitchen aspirin EC 81 MG tablet Take 81 mg by mouth daily.   . calcium-vitamin D (OSCAL WITH D) 500-200 MG-UNIT TABS tablet Take 1 tablet by mouth 2 (two) times daily.   Marland Kitchen CARFILZOMIB IV Inject into the vein.   . Cholecalciferol (VITAMIN D) 50 MCG (2000 UT) CAPS Take 4,000 Units by mouth 2 (two) times daily.   Marland Kitchen denosumab (XGEVA) 120 MG/1.7ML SOLN injection Inject 120 mg into the skin once. 01/18/2019: [employer]   . folic acid (FOLVITE) 1 MG tablet TAKE 1 TABLET BY MOUTH DAILY   . gabapentin (NEURONTIN) 300 MG capsule Take 1 capsule (300 mg total) by mouth 2 (two) times daily. Take two times a day, then increase to three times a day in a few weeks. (Patient taking differently: Take 300 mg by mouth 2 (two) times daily. )   . hydrALAZINE (APRESOLINE) 50 MG tablet Take 1 tablet (50 mg total) by mouth every 8 (eight) hours. (Patient taking differently: Take 50 mg by mouth 2 (two) times daily. )   . metoprolol succinate (TOPROL-XL) 100 MG 24 hr tablet Take 100 mg by mouth daily.    . Multiple Vitamin (MULTI-VITAMIN) tablet Take by mouth.   . Multiple Vitamin (MULTIVITAMIN WITH MINERALS) TABS tablet  Take 1 tablet by mouth daily.   . tamoxifen (NOLVADEX) 20 MG tablet Take 1 tablet (20 mg total) by mouth daily.   . vitamin B-12 (CYANOCOBALAMIN) 1000 MCG tablet Take 1,000 mcg by mouth daily.   Marland Kitchen acyclovir (ZOVIRAX) 800 MG tablet Take 1 tablet (800 mg total) by mouth 2 (two) times daily.   Marland Kitchen loratadine (CLARITIN) 10 MG tablet Take 1 tablet (10 mg total) by mouth daily. (Patient not taking: Reported on 08/30/2019)   . ondansetron (ZOFRAN) 8 MG tablet TAKE ONE TABLET BY MOUTH EVERY 8 HOURS AS NEEDED (Patient not taking: Reported on 08/30/2019)   . pantoprazole (PROTONIX) 40 MG tablet Take 40 mg by mouth every other day.    . polyethylene glycol (MIRALAX / GLYCOLAX) packet Take 17 g by mouth daily as needed for mild constipation. (Patient not taking: Reported on 08/30/2019)   . prochlorperazine (COMPAZINE) 10 MG tablet Take 1 tablet (10 mg total) by mouth every 6 (six) hours as needed for nausea or vomiting. (Patient not taking: Reported on 08/30/2019)   . pseudoephedrine-guaifenesin (MUCINEX D) 60-600 MG 12 hr tablet Take 1 tablet by mouth 2 (two) times daily as needed for congestion.    . [DISCONTINUED] ondansetron (ZOFRAN) 8 MG tablet Take 1 tablet (8 mg total) by mouth every 8 (eight) hours as needed for nausea or vomiting.    Facility-Administered Encounter Medications as of 08/30/2019  Medication  . heparin lock flush 100 unit/mL  . sodium chloride flush (NS) 0.9 % injection 10 mL  . sodium chloride flush (NS) 0.9 % injection 10 mL    ALLERGIES:  Allergies  Allergen Reactions  . Cefepime     Suspected severe thrombocytopenia is a result of cefepime induced antigen platelet destruction     PHYSICAL EXAM:  ECOG Performance status: 1  Vitals:   08/30/19 0841  BP: 135/75  Pulse: 67  Resp: 18  Temp: (!) 97.5 F (36.4 C)  SpO2: 98%   Filed Weights   08/30/19 0841  Weight: 218 lb 12.8 oz (99.2  kg)    Physical Exam Constitutional:      Appearance: Normal appearance.  HENT:     Head:  Normocephalic.     Nose: Nose normal.     Mouth/Throat:     Mouth: Mucous membranes are moist.     Pharynx: Oropharynx is clear.  Eyes:     Extraocular Movements: Extraocular movements intact.     Conjunctiva/sclera: Conjunctivae normal.  Neck:     Musculoskeletal: Normal range of motion.  Cardiovascular:     Rate and Rhythm: Normal rate and regular rhythm.     Pulses: Normal pulses.     Heart sounds: Normal heart sounds.  Pulmonary:     Effort: Pulmonary effort is normal.     Breath sounds: Normal breath sounds.  Abdominal:     General: Bowel sounds are normal.     Palpations: Abdomen is soft.  Musculoskeletal: Normal range of motion.  Skin:    General: Skin is warm and dry.  Neurological:     General: No focal deficit present.     Mental Status: He is alert and oriented to person, place, and time. Mental status is at baseline.  Psychiatric:        Mood and Affect: Mood normal.        Behavior: Behavior normal.        Thought Content: Thought content normal.        Judgment: Judgment normal.      LABORATORY DATA:  I have reviewed the labs as listed.  CBC    Component Value Date/Time   WBC 5.9 08/30/2019 0810   RBC 2.90 (L) 08/30/2019 0810   HGB 10.5 (L) 08/30/2019 0810   HCT 32.3 (L) 08/30/2019 0810   PLT 190 08/30/2019 0810   MCV 111.4 (H) 08/30/2019 0810   MCH 36.2 (H) 08/30/2019 0810   MCHC 32.5 08/30/2019 0810   RDW 13.2 08/30/2019 0810   LYMPHSABS 1.4 08/30/2019 0810   MONOABS 0.6 08/30/2019 0810   EOSABS 0.4 08/30/2019 0810   BASOSABS 0.1 08/30/2019 0810   CMP Latest Ref Rng & Units 08/30/2019 08/17/2019 08/03/2019  Glucose 70 - 99 mg/dL 120(H) 100(H) 115(H)  BUN 8 - 23 mg/dL 19 19 20   Creatinine 0.61 - 1.24 mg/dL 1.68(H) 1.66(H) 1.62(H)  Sodium 135 - 145 mmol/L 138 137 138  Potassium 3.5 - 5.1 mmol/L 4.1 4.1 3.7  Chloride 98 - 111 mmol/L 109 106 108  CO2 22 - 32 mmol/L 26 25 24   Calcium 8.9 - 10.3 mg/dL 9.1 8.9 8.7(L)  Total Protein 6.5 - 8.1 g/dL  5.7(L) 5.7(L) 5.8(L)  Total Bilirubin 0.3 - 1.2 mg/dL 0.4 0.4 0.4  Alkaline Phos 38 - 126 U/L 25(L) 29(L) 28(L)  AST 15 - 41 U/L 18 16 17   ALT 0 - 44 U/L 15 17 18     Radiology: I have reviewed his previous scans.  ASSESSMENT & PLAN:   Multiple myeloma ([place]) 1.  Stage II IgG kappa multiple myeloma (Dx February 2018): -RVD from 03/01/2017 through 06/29/2017 -Chemotherapy changed to 3 cycles of CCyD from 07/12/2017-04/2017 to obtain deeper response - Status post auto stem cell transplant on 10/08/2017 -Stable M spike after transplant, started on CCyD, cycle 1 on 02/14/2018, cycle 2 on 03/14/2018 -His M spike after 2 cycles was undetectable, when measured at [facility] last week.  He was recommended to have maintenance with carfilzomib 70 mg/m square on days 1 and 15 every 28 days by Dr. Norma Fredrickson. -He received his first carfilzomib  74m/m2 on 04/20/2018, developed fever with chills 3 hours later with weakness in the extremities.  He was admitted to the hospital, received 1 unit of blood transfusion.  Blood cultures turned out to be negative.  He was febrile after hospitalization and was sent home. - Subsequently he tolerated carfilzomib and smaller doses very well.  We have increased his doses to 60 mg/m on 09/01/2018 and he tolerated 4 doses very well with the addition of dexamethasone. - Carfilzomib dose was increased to 70 mg per metered square on 10/27/2018.   -He was hospitalized on 11/29/2018 through 12/05/2018 with bronchitis, positive for parainfluenza, acute renal failure and severe thrombocytopenia.  His platelet count went down to 9.  We have given IVIG 1 g/kg x 2 doses.  Platelet count has slowly improved. - Review of literature showed carfilzomib can cause dose-related thrombotic microangiopathy. -Carfilzomib restarted at reduced dose of 35 mg per metered square on 01/05/2019. -We reviewed myeloma panel from 08/17/2019.  M spike is 0.1 g/dL.  Kappa light chains are 32.9.  Lambda light chains  decreased to 4.  Hence ratio has increased to 8.23.  I do not think it is a true increase. -Hence we will continue carfilzomib at the time.  We will plan to repeat myeloma panel in 4 weeks and see him back in 6 weeks for follow-up.   2.  Metastatic left breast cancer to the bones: -Diagnosed in 2011.  He status post lumpectomy.  He will continue tamoxifen.  3.  Peripheral neuropathy: -His neuropathy has been stable.  He will continue gabapentin 300 mg twice daily.  4.  ID prophylaxis: -He will continue acyclovir 400 mg twice daily.  5.  Bone strengthening: -Continue denosumab.  Continue calcium supplements.  6.  Macrocytic anemia: -Etiology is stage III CKD.  Last Feraheme was on 04/27/2019. -He has not received Procrit since 06/06/2019. -Hemoglobin is 10.5 and staying about 10.     Total time spent is 25 minutes with more than 50% of the time spent face-to-face discussing myeloma labs, further treatment plan, counseling and coordination of care.  Orders placed this encounter:  Orders Placed This Encounter  Procedures  . Protein electrophoresis, serum  . Kappa/lambda light chains  . Lactate dehydrogenase  . CBC with Differential/Platelet  . Comprehensive metabolic panel  . Iron and TIBC  . Ferritin  . Vitamin B12  . Folate      SDerek Jack MD [phone]

## 2019-08-30 NOTE — Patient Instructions (Addendum)
[facility] at [facility] Discharge Instructions  You were seen today by Dr. Katragadda. He went over your recent lab results. He will see you back in 6 weeks for labs and follow up.   Thank you for choosing [facility] at [facility] to provide your oncology and hematology care.  To afford each patient quality time with our provider, please arrive at least 15 minutes before your scheduled appointment time.   If you have a lab appointment with the Cancer Center please come in thru the  Main Entrance and check in at the main information desk  You need to re-schedule your appointment should you arrive 10 or more minutes late.  We strive to give you quality time with our providers, and arriving late affects you and other patients whose appointments are after yours.  Also, if you no show three or more times for appointments you may be dismissed from the clinic at the providers discretion.     Again, thank you for choosing [facility].  Our hope is that these requests will decrease the amount of time that you wait before being seen by our physicians.       _____________________________________________________________  Should you have questions after your visit to [facility], please contact our office at [phone] between the hours of 8:00 a.m. and 4:30 p.m.  Voicemails left after 4:00 p.m. will not be returned until the following business day.  For prescription refill requests, have your pharmacy contact our office and allow 72 hours.    Cancer Center Support Programs:   > Cancer Support Group  2nd Tuesday of the month 1pm-2pm, Journey Room    

## 2019-09-01 ENCOUNTER — Encounter (HOSPITAL_COMMUNITY): Payer: Self-pay

## 2019-09-01 ENCOUNTER — Inpatient Hospital Stay (HOSPITAL_COMMUNITY): Payer: Medicare Other

## 2019-09-01 ENCOUNTER — Other Ambulatory Visit: Payer: Self-pay

## 2019-09-01 VITALS — BP 128/69 | HR 66 | Temp 97.5°F | Resp 16

## 2019-09-01 DIAGNOSIS — C50922 Malignant neoplasm of unspecified site of left male breast: Secondary | ICD-10-CM | POA: Diagnosis not present

## 2019-09-01 DIAGNOSIS — C9000 Multiple myeloma not having achieved remission: Secondary | ICD-10-CM | POA: Diagnosis not present

## 2019-09-01 DIAGNOSIS — C50122 Malignant neoplasm of central portion of left male breast: Secondary | ICD-10-CM

## 2019-09-01 DIAGNOSIS — C7951 Secondary malignant neoplasm of bone: Secondary | ICD-10-CM | POA: Diagnosis not present

## 2019-09-01 DIAGNOSIS — Z5111 Encounter for antineoplastic chemotherapy: Secondary | ICD-10-CM | POA: Diagnosis not present

## 2019-09-01 DIAGNOSIS — Z23 Encounter for immunization: Secondary | ICD-10-CM | POA: Diagnosis not present

## 2019-09-01 DIAGNOSIS — Z79899 Other long term (current) drug therapy: Secondary | ICD-10-CM | POA: Diagnosis not present

## 2019-09-01 MED ORDER — DENOSUMAB 120 MG/1.7ML ~~LOC~~ SOLN
120.0000 mg | Freq: Once | SUBCUTANEOUS | Status: AC
  Administered 2019-09-01: 120 mg via SUBCUTANEOUS

## 2019-09-01 MED ORDER — DENOSUMAB 120 MG/1.7ML ~~LOC~~ SOLN
SUBCUTANEOUS | Status: AC
  Filled 2019-09-01: qty 1.7

## 2019-09-01 NOTE — Patient Instructions (Signed)
[place] at [facility] Discharge Instructions Received Delton See injection today. Follow-up as scheduled. Call clinic for any questions or concerns   Thank you for choosing [place] at [facility] to provide your oncology and hematology care.  To afford each patient quality time with our provider, please arrive at least 15 minutes before your scheduled appointment time.   If you have a lab appointment with the [place] please come in thru the Main Entrance and check in at the main information desk.  You need to re-schedule your appointment should you arrive 10 or more minutes late.  We strive to give you quality time with our providers, and arriving late affects you and other patients whose appointments are after yours.  Also, if you no show three or more times for appointments you may be dismissed from the clinic at the providers discretion.     Again, thank you for choosing [facility].  Our hope is that these requests will decrease the amount of time that you wait before being seen by our physicians.       _____________________________________________________________  Should you have questions after your visit to [facility], please contact our office at [phone] between the hours of 8:00 a.m. and 4:30 p.m.  Voicemails left after 4:00 p.m. will not be returned until the following business day.  For prescription refill requests, have your pharmacy contact our office and allow 72 hours.    Due to Covid, you will need to wear a mask upon entering the hospital. If you do not have a mask, a mask will be given to you at the Main Entrance upon arrival. For doctor visits, patients may have 1 support person with them. For treatment visits, patients can not have anyone with them due to social distancing guidelines and our immunocompromised population.

## 2019-09-01 NOTE — Progress Notes (Signed)
Johnny Lucas tolerated Xgeva injection well without complaints or incident. Calcium 9.1 today and pt denied any tooth or jaw pain and no recent or future dental visits prior to administering this medication. VSS Pt discharged sself ambulatory in satisfactory condition

## 2019-09-02 NOTE — Assessment & Plan Note (Signed)
1.  Stage II IgG kappa multiple myeloma (Dx February 2018): -RVD from 03/01/2017 through 06/29/2017 -Chemotherapy changed to 3 cycles of CCyD from 07/12/2017-04/2017 to obtain deeper response - Status post auto stem cell transplant on 10/08/2017 -Stable M spike after transplant, started on CCyD, cycle 1 on 02/14/2018, cycle 2 on 03/14/2018 -His M spike after 2 cycles was undetectable, when measured at [facility] last week.  He was recommended to have maintenance with carfilzomib 70 mg/m square on days 1 and 15 every 28 days by Dr. Norma Fredrickson. -He received his first carfilzomib 17m/m2 on 04/20/2018, developed fever with chills 3 hours later with weakness in the extremities.  He was admitted to the hospital, received 1 unit of blood transfusion.  Blood cultures turned out to be negative.  He was febrile after hospitalization and was sent home. - Subsequently he tolerated carfilzomib and smaller doses very well.  We have increased his doses to 60 mg/m on 09/01/2018 and he tolerated 4 doses very well with the addition of dexamethasone. - Carfilzomib dose was increased to 70 mg per metered square on 10/27/2018.   -He was hospitalized on 11/29/2018 through 12/05/2018 with bronchitis, positive for parainfluenza, acute renal failure and severe thrombocytopenia.  His platelet count went down to 9.  We have given IVIG 1 g/kg x 2 doses.  Platelet count has slowly improved. - Review of literature showed carfilzomib can cause dose-related thrombotic microangiopathy. -Carfilzomib restarted at reduced dose of 35 mg per metered square on 01/05/2019. -We reviewed myeloma panel from 08/17/2019.  M spike is 0.1 g/dL.  Kappa light chains are 32.9.  Lambda light chains decreased to 4.  Hence ratio has increased to 8.23.  I do not think it is a true increase. -Hence we will continue carfilzomib at the time.  We will plan to repeat myeloma panel in 4 weeks and see him back in 6 weeks for follow-up.   2.  Metastatic left breast cancer to  the bones: -Diagnosed in 2011.  He status post lumpectomy.  He will continue tamoxifen.  3.  Peripheral neuropathy: -His neuropathy has been stable.  He will continue gabapentin 300 mg twice daily.  4.  ID prophylaxis: -He will continue acyclovir 400 mg twice daily.  5.  Bone strengthening: -Continue denosumab.  Continue calcium supplements.  6.  Macrocytic anemia: -Etiology is stage III CKD.  Last Feraheme was on 04/27/2019. -He has not received Procrit since 06/06/2019. -Hemoglobin is 10.5 and staying about 10.

## 2019-09-12 ENCOUNTER — Other Ambulatory Visit (HOSPITAL_COMMUNITY): Payer: Self-pay

## 2019-09-12 DIAGNOSIS — C9000 Multiple myeloma not having achieved remission: Secondary | ICD-10-CM

## 2019-09-13 ENCOUNTER — Encounter (HOSPITAL_COMMUNITY): Payer: Self-pay | Admitting: *Deleted

## 2019-09-13 ENCOUNTER — Inpatient Hospital Stay (HOSPITAL_COMMUNITY): Payer: Medicare Other

## 2019-09-13 ENCOUNTER — Encounter (HOSPITAL_COMMUNITY): Payer: Self-pay

## 2019-09-13 ENCOUNTER — Other Ambulatory Visit: Payer: Self-pay

## 2019-09-13 VITALS — BP 119/62 | HR 63 | Temp 97.6°F | Resp 18 | Wt 215.4 lb

## 2019-09-13 DIAGNOSIS — Z5111 Encounter for antineoplastic chemotherapy: Secondary | ICD-10-CM | POA: Diagnosis not present

## 2019-09-13 DIAGNOSIS — C50922 Malignant neoplasm of unspecified site of left male breast: Secondary | ICD-10-CM | POA: Diagnosis not present

## 2019-09-13 DIAGNOSIS — Z79899 Other long term (current) drug therapy: Secondary | ICD-10-CM | POA: Diagnosis not present

## 2019-09-13 DIAGNOSIS — C9000 Multiple myeloma not having achieved remission: Secondary | ICD-10-CM | POA: Diagnosis not present

## 2019-09-13 DIAGNOSIS — Z23 Encounter for immunization: Secondary | ICD-10-CM | POA: Diagnosis not present

## 2019-09-13 DIAGNOSIS — C7951 Secondary malignant neoplasm of bone: Secondary | ICD-10-CM | POA: Diagnosis not present

## 2019-09-13 LAB — COMPREHENSIVE METABOLIC PANEL
ALT: 16 U/L (ref 0–44)
AST: 15 U/L (ref 15–41)
Albumin: 3.8 g/dL (ref 3.5–5.0)
Alkaline Phosphatase: 26 U/L — ABNORMAL LOW (ref 38–126)
Anion gap: 9 (ref 5–15)
BUN: 21 mg/dL (ref 8–23)
CO2: 23 mmol/L (ref 22–32)
Calcium: 9 mg/dL (ref 8.9–10.3)
Chloride: 106 mmol/L (ref 98–111)
Creatinine, Ser: 1.66 mg/dL — ABNORMAL HIGH (ref 0.61–1.24)
GFR calc Af Amer: 47 mL/min — ABNORMAL LOW (ref >60)
GFR calc non Af Amer: 41 mL/min — ABNORMAL LOW (ref >60)
Glucose, Bld: 100 mg/dL — ABNORMAL HIGH (ref 70–99)
Potassium: 4.2 mmol/L (ref 3.5–5.1)
Sodium: 138 mmol/L (ref 135–145)
Total Bilirubin: 0.8 mg/dL (ref 0.3–1.2)
Total Protein: 5.9 g/dL — ABNORMAL LOW (ref 6.5–8.1)

## 2019-09-13 LAB — CBC WITH DIFFERENTIAL/PLATELET
Abs Immature Granulocytes: 0.01 10*3/uL (ref 0.00–0.07)
Basophils Absolute: 0 10*3/uL (ref 0.0–0.1)
Basophils Relative: 1 %
Eosinophils Absolute: 0.2 10*3/uL (ref 0.0–0.5)
Eosinophils Relative: 4 %
HCT: 30.9 % — ABNORMAL LOW (ref 39.0–52.0)
Hemoglobin: 10.4 g/dL — ABNORMAL LOW (ref 13.0–17.0)
Immature Granulocytes: 0 %
Lymphocytes Relative: 20 %
Lymphs Abs: 1.2 10*3/uL (ref 0.7–4.0)
MCH: 36.6 pg — ABNORMAL HIGH (ref 26.0–34.0)
MCHC: 33.7 g/dL (ref 30.0–36.0)
MCV: 108.8 fL — ABNORMAL HIGH (ref 80.0–100.0)
Monocytes Absolute: 0.6 10*3/uL (ref 0.1–1.0)
Monocytes Relative: 10 %
Neutro Abs: 4.2 10*3/uL (ref 1.7–7.7)
Neutrophils Relative %: 65 %
Platelets: 200 10*3/uL (ref 150–400)
RBC: 2.84 MIL/uL — ABNORMAL LOW (ref 4.22–5.81)
RDW: 13.5 % (ref 11.5–15.5)
WBC: 6.3 10*3/uL (ref 4.0–10.5)
nRBC: 0 % (ref 0.0–0.2)

## 2019-09-13 MED ORDER — SODIUM CHLORIDE 0.9% FLUSH
10.0000 mL | INTRAVENOUS | Status: DC | PRN
  Administered 2019-09-13: 10 mL
  Filled 2019-09-13: qty 10

## 2019-09-13 MED ORDER — SODIUM CHLORIDE 0.9 % IV SOLN
INTRAVENOUS | Status: DC
  Administered 2019-09-13: 13:00:00 via INTRAVENOUS

## 2019-09-13 MED ORDER — HEPARIN SOD (PORK) LOCK FLUSH 100 UNIT/ML IV SOLN
500.0000 [IU] | Freq: Once | INTRAVENOUS | Status: AC | PRN
  Administered 2019-09-13: 16:00:00 500 [IU]

## 2019-09-13 MED ORDER — DEXTROSE 5 % IV SOLN
34.0000 mg/m2 | Freq: Once | INTRAVENOUS | Status: AC
  Administered 2019-09-13: 15:00:00 70 mg via INTRAVENOUS
  Filled 2019-09-13: qty 30

## 2019-09-13 MED ORDER — SODIUM CHLORIDE 0.9 % IV SOLN
Freq: Once | INTRAVENOUS | Status: AC
  Administered 2019-09-13: 13:00:00 via INTRAVENOUS

## 2019-09-13 MED ORDER — SODIUM CHLORIDE 0.9 % IV SOLN
20.0000 mg | Freq: Once | INTRAVENOUS | Status: AC
  Administered 2019-09-13: 20 mg via INTRAVENOUS
  Filled 2019-09-13: qty 20

## 2019-09-13 MED ORDER — SODIUM CHLORIDE 0.9 % IV SOLN
Freq: Once | INTRAVENOUS | Status: AC
  Administered 2019-09-13: 15:00:00 via INTRAVENOUS

## 2019-09-13 MED ORDER — ACETAMINOPHEN 325 MG PO TABS
650.0000 mg | ORAL_TABLET | Freq: Once | ORAL | Status: AC
  Administered 2019-09-13: 14:00:00 650 mg via ORAL
  Filled 2019-09-13: qty 2

## 2019-09-13 NOTE — Progress Notes (Signed)
Patient tolerated chemotherapy with no complaints voiced.  Port site clean and dry with no bruising or swelling noted at site.  Good blood return noted before and after administration of chemotherapy.  Band aid applied.  Patient left ambulatory with VSS and no s/s of distress noted.  

## 2019-09-13 NOTE — Progress Notes (Addendum)
At the request of the patient, I called to speak to the nurse at [facility] to request orders for his upcoming labs and immunizations.

## 2019-09-13 NOTE — Progress Notes (Signed)
Creatinine 1.66, MD aware. Labs reviewed, proceed per protocol.

## 2019-09-20 ENCOUNTER — Encounter (HOSPITAL_COMMUNITY): Payer: Self-pay | Admitting: *Deleted

## 2019-09-20 ENCOUNTER — Other Ambulatory Visit (HOSPITAL_COMMUNITY): Payer: Self-pay | Admitting: *Deleted

## 2019-09-20 DIAGNOSIS — C9000 Multiple myeloma not having achieved remission: Secondary | ICD-10-CM

## 2019-09-20 NOTE — Progress Notes (Signed)
I received orders for labs and immunizations for patient to receive at his next visit from [facility] need to be faxed to them once they result.

## 2019-09-25 ENCOUNTER — Other Ambulatory Visit (HOSPITAL_COMMUNITY): Payer: Self-pay | Admitting: Nurse Practitioner

## 2019-09-25 DIAGNOSIS — C9000 Multiple myeloma not having achieved remission: Secondary | ICD-10-CM

## 2019-09-27 ENCOUNTER — Inpatient Hospital Stay (HOSPITAL_COMMUNITY): Payer: Medicare Other

## 2019-09-27 ENCOUNTER — Other Ambulatory Visit: Payer: Self-pay

## 2019-09-27 VITALS — BP 129/73 | HR 72 | Temp 97.7°F | Resp 16 | Wt 215.0 lb

## 2019-09-27 DIAGNOSIS — C7951 Secondary malignant neoplasm of bone: Secondary | ICD-10-CM

## 2019-09-27 DIAGNOSIS — Z5111 Encounter for antineoplastic chemotherapy: Secondary | ICD-10-CM | POA: Diagnosis not present

## 2019-09-27 DIAGNOSIS — C50922 Malignant neoplasm of unspecified site of left male breast: Secondary | ICD-10-CM | POA: Diagnosis not present

## 2019-09-27 DIAGNOSIS — C9000 Multiple myeloma not having achieved remission: Secondary | ICD-10-CM

## 2019-09-27 DIAGNOSIS — Z79899 Other long term (current) drug therapy: Secondary | ICD-10-CM | POA: Diagnosis not present

## 2019-09-27 DIAGNOSIS — Z23 Encounter for immunization: Secondary | ICD-10-CM | POA: Diagnosis not present

## 2019-09-27 LAB — COMPREHENSIVE METABOLIC PANEL
ALT: 18 U/L (ref 0–44)
AST: 17 U/L (ref 15–41)
Albumin: 4 g/dL (ref 3.5–5.0)
Alkaline Phosphatase: 27 U/L — ABNORMAL LOW (ref 38–126)
Anion gap: 5 (ref 5–15)
BUN: 23 mg/dL (ref 8–23)
CO2: 26 mmol/L (ref 22–32)
Calcium: 9.2 mg/dL (ref 8.9–10.3)
Chloride: 106 mmol/L (ref 98–111)
Creatinine, Ser: 1.94 mg/dL — ABNORMAL HIGH (ref 0.61–1.24)
GFR calc Af Amer: 39 mL/min — ABNORMAL LOW (ref >60)
GFR calc non Af Amer: 34 mL/min — ABNORMAL LOW (ref >60)
Glucose, Bld: 98 mg/dL (ref 70–99)
Potassium: 4.2 mmol/L (ref 3.5–5.1)
Sodium: 137 mmol/L (ref 135–145)
Total Bilirubin: 0.8 mg/dL (ref 0.3–1.2)
Total Protein: 6 g/dL — ABNORMAL LOW (ref 6.5–8.1)

## 2019-09-27 LAB — CBC WITH DIFFERENTIAL/PLATELET
Abs Immature Granulocytes: 0.01 10*3/uL (ref 0.00–0.07)
Basophils Absolute: 0.1 10*3/uL (ref 0.0–0.1)
Basophils Relative: 1 %
Eosinophils Absolute: 0.2 10*3/uL (ref 0.0–0.5)
Eosinophils Relative: 3 %
HCT: 30.7 % — ABNORMAL LOW (ref 39.0–52.0)
Hemoglobin: 10.1 g/dL — ABNORMAL LOW (ref 13.0–17.0)
Immature Granulocytes: 0 %
Lymphocytes Relative: 20 %
Lymphs Abs: 1.3 10*3/uL (ref 0.7–4.0)
MCH: 36.1 pg — ABNORMAL HIGH (ref 26.0–34.0)
MCHC: 32.9 g/dL (ref 30.0–36.0)
MCV: 109.6 fL — ABNORMAL HIGH (ref 80.0–100.0)
Monocytes Absolute: 0.7 10*3/uL (ref 0.1–1.0)
Monocytes Relative: 10 %
Neutro Abs: 4.3 10*3/uL (ref 1.7–7.7)
Neutrophils Relative %: 66 %
Platelets: 198 10*3/uL (ref 150–400)
RBC: 2.8 MIL/uL — ABNORMAL LOW (ref 4.22–5.81)
RDW: 13.6 % (ref 11.5–15.5)
WBC: 6.5 10*3/uL (ref 4.0–10.5)
nRBC: 0 % (ref 0.0–0.2)

## 2019-09-27 MED ORDER — DENOSUMAB 120 MG/1.7ML ~~LOC~~ SOLN
120.0000 mg | Freq: Once | SUBCUTANEOUS | Status: AC
  Administered 2019-09-27: 14:00:00 120 mg via SUBCUTANEOUS
  Filled 2019-09-27: qty 1.7

## 2019-09-27 MED ORDER — PNEUMOCOCCAL VAC POLYVALENT 25 MCG/0.5ML IJ INJ
0.5000 mL | INJECTION | Freq: Once | INTRAMUSCULAR | Status: AC
  Administered 2019-09-27: 13:00:00 0.5 mL via INTRAMUSCULAR
  Filled 2019-09-27: qty 0.5

## 2019-09-27 MED ORDER — SODIUM CHLORIDE 0.9 % IV SOLN
INTRAVENOUS | Status: DC
  Administered 2019-09-27: 13:00:00 via INTRAVENOUS

## 2019-09-27 MED ORDER — SODIUM CHLORIDE 0.9% FLUSH
10.0000 mL | Freq: Once | INTRAVENOUS | Status: AC
  Administered 2019-09-27: 10 mL via INTRAVENOUS

## 2019-09-27 MED ORDER — HEPARIN SOD (PORK) LOCK FLUSH 100 UNIT/ML IV SOLN
500.0000 [IU] | Freq: Once | INTRAVENOUS | Status: AC
  Administered 2019-09-27: 500 [IU] via INTRAVENOUS

## 2019-09-27 MED ORDER — SODIUM CHLORIDE 0.9 % IV SOLN
Freq: Once | INTRAVENOUS | Status: AC
  Administered 2019-09-27: 13:00:00 via INTRAVENOUS

## 2019-09-27 MED ORDER — DTAP-IPV VACCINE IM SUSP
0.5000 mL | Freq: Once | INTRAMUSCULAR | Status: AC
  Administered 2019-09-27: 0.5 mL via INTRAMUSCULAR
  Filled 2019-09-27: qty 0.5

## 2019-09-27 MED ORDER — HEPATITIS B VAC RECOMBINANT 20 MCG/ML IJ SUSP
2.0000 mL | Freq: Once | INTRAMUSCULAR | Status: AC
  Administered 2019-09-27: 13:00:00 40 ug via INTRAMUSCULAR
  Filled 2019-09-27: qty 2

## 2019-09-27 NOTE — Progress Notes (Signed)
Labs reviewed with MD today. Creatinine was 1.94. Will hold treatment today per MD, will also give 551ml bolus over 30 min per orders.   Will do labs and possible treatment tomorrow per MD.   Vaccinations and xgeva given per orders. Patient tolerated it well without problems. Vitals stable and discharged home from clinic ambulatory.Encouraged patient to increase his fluids.  Follow up as scheduled.

## 2019-09-28 ENCOUNTER — Inpatient Hospital Stay (HOSPITAL_COMMUNITY): Payer: Medicare Other

## 2019-09-28 ENCOUNTER — Inpatient Hospital Stay (HOSPITAL_COMMUNITY): Payer: Medicare Other | Attending: Hematology

## 2019-09-28 ENCOUNTER — Other Ambulatory Visit: Payer: Self-pay

## 2019-09-28 VITALS — BP 120/71 | HR 71 | Temp 97.8°F | Resp 18 | Wt 218.2 lb

## 2019-09-28 DIAGNOSIS — Z853 Personal history of malignant neoplasm of breast: Secondary | ICD-10-CM | POA: Diagnosis not present

## 2019-09-28 DIAGNOSIS — Z79899 Other long term (current) drug therapy: Secondary | ICD-10-CM | POA: Insufficient documentation

## 2019-09-28 DIAGNOSIS — G629 Polyneuropathy, unspecified: Secondary | ICD-10-CM | POA: Diagnosis not present

## 2019-09-28 DIAGNOSIS — C9000 Multiple myeloma not having achieved remission: Secondary | ICD-10-CM

## 2019-09-28 DIAGNOSIS — I129 Hypertensive chronic kidney disease with stage 1 through stage 4 chronic kidney disease, or unspecified chronic kidney disease: Secondary | ICD-10-CM | POA: Insufficient documentation

## 2019-09-28 DIAGNOSIS — Z23 Encounter for immunization: Secondary | ICD-10-CM | POA: Insufficient documentation

## 2019-09-28 DIAGNOSIS — Z5111 Encounter for antineoplastic chemotherapy: Secondary | ICD-10-CM | POA: Insufficient documentation

## 2019-09-28 DIAGNOSIS — N183 Chronic kidney disease, stage 3 unspecified: Secondary | ICD-10-CM | POA: Diagnosis not present

## 2019-09-28 LAB — COMPREHENSIVE METABOLIC PANEL
ALT: 17 U/L (ref 0–44)
AST: 18 U/L (ref 15–41)
Albumin: 3.9 g/dL (ref 3.5–5.0)
Alkaline Phosphatase: 29 U/L — ABNORMAL LOW (ref 38–126)
Anion gap: 11 (ref 5–15)
BUN: 20 mg/dL (ref 8–23)
CO2: 24 mmol/L (ref 22–32)
Calcium: 9.7 mg/dL (ref 8.9–10.3)
Chloride: 103 mmol/L (ref 98–111)
Creatinine, Ser: 1.74 mg/dL — ABNORMAL HIGH (ref 0.61–1.24)
GFR calc Af Amer: 45 mL/min — ABNORMAL LOW (ref >60)
GFR calc non Af Amer: 39 mL/min — ABNORMAL LOW (ref >60)
Glucose, Bld: 123 mg/dL — ABNORMAL HIGH (ref 70–99)
Potassium: 4.1 mmol/L (ref 3.5–5.1)
Sodium: 138 mmol/L (ref 135–145)
Total Bilirubin: 0.6 mg/dL (ref 0.3–1.2)
Total Protein: 6 g/dL — ABNORMAL LOW (ref 6.5–8.1)

## 2019-09-28 LAB — IGG, IGA, IGM
IgA: 13 mg/dL — ABNORMAL LOW (ref 61–437)
IgG (Immunoglobin G), Serum: 372 mg/dL — ABNORMAL LOW (ref 603–1613)
IgM (Immunoglobulin M), Srm: 36 mg/dL (ref 15–143)

## 2019-09-28 MED ORDER — SODIUM CHLORIDE 0.9 % IV SOLN
20.0000 mg | Freq: Once | INTRAVENOUS | Status: AC
  Administered 2019-09-28: 13:00:00 20 mg via INTRAVENOUS
  Filled 2019-09-28: qty 20

## 2019-09-28 MED ORDER — SODIUM CHLORIDE 0.9 % IV SOLN
Freq: Once | INTRAVENOUS | Status: AC
  Administered 2019-09-28: 12:00:00 via INTRAVENOUS

## 2019-09-28 MED ORDER — SODIUM CHLORIDE 0.9 % IV SOLN
Freq: Once | INTRAVENOUS | Status: AC
  Administered 2019-09-28: 14:00:00 via INTRAVENOUS

## 2019-09-28 MED ORDER — HEPARIN SOD (PORK) LOCK FLUSH 100 UNIT/ML IV SOLN
500.0000 [IU] | Freq: Once | INTRAVENOUS | Status: AC | PRN
  Administered 2019-09-28: 15:00:00 500 [IU]

## 2019-09-28 MED ORDER — DEXTROSE 5 % IV SOLN
34.0000 mg/m2 | Freq: Once | INTRAVENOUS | Status: AC
  Administered 2019-09-28: 13:00:00 70 mg via INTRAVENOUS
  Filled 2019-09-28: qty 5

## 2019-09-28 MED ORDER — ACETAMINOPHEN 325 MG PO TABS
650.0000 mg | ORAL_TABLET | Freq: Once | ORAL | Status: AC
  Administered 2019-09-28: 12:00:00 650 mg via ORAL
  Filled 2019-09-28: qty 2

## 2019-09-28 MED ORDER — SODIUM CHLORIDE 0.9% FLUSH: 10.0000 mL | INTRAVENOUS | Status: DC | PRN

## 2019-09-28 NOTE — Progress Notes (Signed)
Labs reviewed with Dr. Delton Coombes today. Will proceed with treatment as planned. Creatinine 1.74 noted.   Treatment given per orders. Patient tolerated it well without problems. Vitals stable and discharged home from clinic ambulatory. Follow up as scheduled.

## 2019-09-28 NOTE — Patient Instructions (Signed)
[facility] Discharge Instructions for Patients Receiving Chemotherapy  Today you received the following chemotherapy agents   To help prevent nausea and vomiting after your treatment, we encourage you to take your nausea medication   If you develop nausea and vomiting that is not controlled by your nausea medication, call the clinic.   BELOW ARE SYMPTOMS THAT SHOULD BE REPORTED IMMEDIATELY:  *FEVER GREATER THAN 100.5 F  *CHILLS WITH OR WITHOUT FEVER  NAUSEA AND VOMITING THAT IS NOT CONTROLLED WITH YOUR NAUSEA MEDICATION  *UNUSUAL SHORTNESS OF BREATH  *UNUSUAL BRUISING OR BLEEDING  TENDERNESS IN MOUTH AND THROAT WITH OR WITHOUT PRESENCE OF ULCERS  *URINARY PROBLEMS  *BOWEL PROBLEMS  UNUSUAL RASH Items with * indicate a potential emergency and should be followed up as soon as possible.  Feel free to call the clinic should you have any questions or concerns. The clinic phone number is [phone].  Please show the CHEMO ALERT CARD at check-in to the Emergency Department and triage nurse.   

## 2019-09-29 ENCOUNTER — Ambulatory Visit (HOSPITAL_COMMUNITY): Payer: Medicare Other

## 2019-09-29 LAB — KAPPA/LAMBDA LIGHT CHAINS
Kappa free light chain: 53.3 mg/L — ABNORMAL HIGH (ref 3.3–19.4)
Kappa free light chain: 57.2 mg/L — ABNORMAL HIGH (ref 3.3–19.4)
Kappa, lambda light chain ratio: 4.44 — ABNORMAL HIGH (ref 0.26–1.65)
Kappa, lambda light chain ratio: 4.69 — ABNORMAL HIGH (ref 0.26–1.65)
Lambda free light chains: 12 mg/L (ref 5.7–26.3)
Lambda free light chains: 12.2 mg/L (ref 5.7–26.3)

## 2019-09-29 LAB — PROTEIN ELECTROPHORESIS, SERUM
A/G Ratio: 1.9 — ABNORMAL HIGH (ref 0.7–1.7)
Albumin ELP: 3.6 g/dL (ref 2.9–4.4)
Alpha-1-Globulin: 0.2 g/dL (ref 0.0–0.4)
Alpha-2-Globulin: 0.7 g/dL (ref 0.4–1.0)
Beta Globulin: 0.7 g/dL (ref 0.7–1.3)
Gamma Globulin: 0.3 g/dL — ABNORMAL LOW (ref 0.4–1.8)
Globulin, Total: 1.9 g/dL — ABNORMAL LOW (ref 2.2–3.9)
M-Spike, %: 0.1 g/dL — ABNORMAL HIGH
Total Protein ELP: 5.5 g/dL — ABNORMAL LOW (ref 6.0–8.5)

## 2019-09-29 LAB — IMMUNOFIXATION ELECTROPHORESIS
IgA: 13 mg/dL — ABNORMAL LOW (ref 61–437)
IgG (Immunoglobin G), Serum: 367 mg/dL — ABNORMAL LOW (ref 603–1613)
IgM (Immunoglobulin M), Srm: 32 mg/dL (ref 15–143)
Total Protein ELP: 5.6 g/dL — ABNORMAL LOW (ref 6.0–8.5)

## 2019-10-02 ENCOUNTER — Encounter (HOSPITAL_COMMUNITY): Payer: Self-pay | Admitting: *Deleted

## 2019-10-02 NOTE — Progress Notes (Signed)
Labs faxed to [facility] per their orders.

## 2019-10-04 ENCOUNTER — Telehealth (HOSPITAL_COMMUNITY): Payer: Self-pay | Admitting: *Deleted

## 2019-10-04 NOTE — Telephone Encounter (Signed)
Patient called today and stated that his root canal appt was scheduled for next Monday. Patient just had XGEVA at the end of September. Patient instructed that he needed to cancel that appt and reschdule the root canal for October 28th or after. I also instructed him that he could not receive XGEVA for 28 to 30 days after the root canal. He and his wife stated that they were writing it down and that they would get the root canal appt rescheduled. Patient not scheduled to return to [place] for [facility] until December 1 in which we can adjust once we know his root canal date.

## 2019-10-05 ENCOUNTER — Other Ambulatory Visit (HOSPITAL_COMMUNITY): Payer: Self-pay | Admitting: *Deleted

## 2019-10-05 DIAGNOSIS — H35713 Central serous chorioretinopathy, bilateral: Secondary | ICD-10-CM | POA: Diagnosis not present

## 2019-10-05 DIAGNOSIS — H25811 Combined forms of age-related cataract, right eye: Secondary | ICD-10-CM | POA: Diagnosis not present

## 2019-10-05 DIAGNOSIS — Z961 Presence of intraocular lens: Secondary | ICD-10-CM | POA: Diagnosis not present

## 2019-10-06 DIAGNOSIS — C9000 Multiple myeloma not having achieved remission: Secondary | ICD-10-CM | POA: Diagnosis not present

## 2019-10-06 DIAGNOSIS — Z9484 Stem cells transplant status: Secondary | ICD-10-CM | POA: Diagnosis not present

## 2019-10-11 ENCOUNTER — Other Ambulatory Visit (HOSPITAL_COMMUNITY): Payer: Medicare Other

## 2019-10-11 ENCOUNTER — Ambulatory Visit (HOSPITAL_COMMUNITY): Payer: Medicare Other | Admitting: Hematology

## 2019-10-11 ENCOUNTER — Ambulatory Visit (HOSPITAL_COMMUNITY): Payer: Medicare Other

## 2019-10-11 ENCOUNTER — Other Ambulatory Visit (HOSPITAL_COMMUNITY): Payer: Self-pay | Admitting: *Deleted

## 2019-10-12 ENCOUNTER — Inpatient Hospital Stay (HOSPITAL_COMMUNITY): Payer: Medicare Other

## 2019-10-12 ENCOUNTER — Encounter (HOSPITAL_COMMUNITY): Payer: Self-pay

## 2019-10-12 ENCOUNTER — Other Ambulatory Visit: Payer: Self-pay

## 2019-10-12 ENCOUNTER — Ambulatory Visit (HOSPITAL_COMMUNITY): Payer: Medicare Other

## 2019-10-12 VITALS — BP 126/63 | HR 62 | Temp 97.6°F | Resp 18 | Wt 219.2 lb

## 2019-10-12 DIAGNOSIS — Z5111 Encounter for antineoplastic chemotherapy: Secondary | ICD-10-CM | POA: Diagnosis not present

## 2019-10-12 DIAGNOSIS — Z23 Encounter for immunization: Secondary | ICD-10-CM | POA: Diagnosis not present

## 2019-10-12 DIAGNOSIS — C9000 Multiple myeloma not having achieved remission: Secondary | ICD-10-CM

## 2019-10-12 DIAGNOSIS — G629 Polyneuropathy, unspecified: Secondary | ICD-10-CM | POA: Diagnosis not present

## 2019-10-12 DIAGNOSIS — N183 Chronic kidney disease, stage 3 unspecified: Secondary | ICD-10-CM | POA: Diagnosis not present

## 2019-10-12 DIAGNOSIS — I129 Hypertensive chronic kidney disease with stage 1 through stage 4 chronic kidney disease, or unspecified chronic kidney disease: Secondary | ICD-10-CM | POA: Diagnosis not present

## 2019-10-12 LAB — CBC WITH DIFFERENTIAL/PLATELET
Abs Immature Granulocytes: 0.01 10*3/uL (ref 0.00–0.07)
Basophils Absolute: 0 10*3/uL (ref 0.0–0.1)
Basophils Relative: 1 %
Eosinophils Absolute: 0.3 10*3/uL (ref 0.0–0.5)
Eosinophils Relative: 5 %
HCT: 31 % — ABNORMAL LOW (ref 39.0–52.0)
Hemoglobin: 10 g/dL — ABNORMAL LOW (ref 13.0–17.0)
Immature Granulocytes: 0 %
Lymphocytes Relative: 19 %
Lymphs Abs: 1.1 10*3/uL (ref 0.7–4.0)
MCH: 35.8 pg — ABNORMAL HIGH (ref 26.0–34.0)
MCHC: 32.3 g/dL (ref 30.0–36.0)
MCV: 111.1 fL — ABNORMAL HIGH (ref 80.0–100.0)
Monocytes Absolute: 0.5 10*3/uL (ref 0.1–1.0)
Monocytes Relative: 9 %
Neutro Abs: 3.8 10*3/uL (ref 1.7–7.7)
Neutrophils Relative %: 66 %
Platelets: 195 10*3/uL (ref 150–400)
RBC: 2.79 MIL/uL — ABNORMAL LOW (ref 4.22–5.81)
RDW: 13.4 % (ref 11.5–15.5)
WBC: 5.8 10*3/uL (ref 4.0–10.5)
nRBC: 0 % (ref 0.0–0.2)

## 2019-10-12 LAB — IRON AND TIBC
Iron: 72 ug/dL (ref 45–182)
Saturation Ratios: 21 % (ref 17.9–39.5)
TIBC: 341 ug/dL (ref 250–450)
UIBC: 269 ug/dL

## 2019-10-12 LAB — COMPREHENSIVE METABOLIC PANEL
ALT: 16 U/L (ref 0–44)
AST: 17 U/L (ref 15–41)
Albumin: 3.7 g/dL (ref 3.5–5.0)
Alkaline Phosphatase: 26 U/L — ABNORMAL LOW (ref 38–126)
Anion gap: 8 (ref 5–15)
BUN: 15 mg/dL (ref 8–23)
CO2: 26 mmol/L (ref 22–32)
Calcium: 9.2 mg/dL (ref 8.9–10.3)
Chloride: 106 mmol/L (ref 98–111)
Creatinine, Ser: 1.63 mg/dL — ABNORMAL HIGH (ref 0.61–1.24)
GFR calc Af Amer: 48 mL/min — ABNORMAL LOW (ref >60)
GFR calc non Af Amer: 42 mL/min — ABNORMAL LOW (ref >60)
Glucose, Bld: 110 mg/dL — ABNORMAL HIGH (ref 70–99)
Potassium: 4.1 mmol/L (ref 3.5–5.1)
Sodium: 140 mmol/L (ref 135–145)
Total Bilirubin: 0.3 mg/dL (ref 0.3–1.2)
Total Protein: 6 g/dL — ABNORMAL LOW (ref 6.5–8.1)

## 2019-10-12 LAB — FERRITIN: Ferritin: 454 ng/mL — ABNORMAL HIGH (ref 24–336)

## 2019-10-12 LAB — VITAMIN B12: Vitamin B-12: 1387 pg/mL — ABNORMAL HIGH (ref 180–914)

## 2019-10-12 LAB — FOLATE: Folate: 94.4 ng/mL (ref >5.9)

## 2019-10-12 LAB — LACTATE DEHYDROGENASE: LDH: 147 U/L (ref 98–192)

## 2019-10-12 MED ORDER — SODIUM CHLORIDE 0.9 % IV SOLN
Freq: Once | INTRAVENOUS | Status: AC
  Administered 2019-10-12: 15:00:00 via INTRAVENOUS

## 2019-10-12 MED ORDER — HEPARIN SOD (PORK) LOCK FLUSH 100 UNIT/ML IV SOLN
500.0000 [IU] | Freq: Once | INTRAVENOUS | Status: AC | PRN
  Administered 2019-10-12: 16:00:00 500 [IU]

## 2019-10-12 MED ORDER — ACETAMINOPHEN 325 MG PO TABS
650.0000 mg | ORAL_TABLET | Freq: Once | ORAL | Status: AC
  Administered 2019-10-12: 14:00:00 650 mg via ORAL
  Filled 2019-10-12: qty 2

## 2019-10-12 MED ORDER — INFLUENZA VAC A&B SA ADJ QUAD 0.5 ML IM PRSY
0.5000 mL | PREFILLED_SYRINGE | Freq: Once | INTRAMUSCULAR | Status: AC
  Administered 2019-10-12: 15:00:00 0.5 mL via INTRAMUSCULAR
  Filled 2019-10-12: qty 0.5

## 2019-10-12 MED ORDER — SODIUM CHLORIDE 0.9 % IV SOLN
20.0000 mg | Freq: Once | INTRAVENOUS | Status: AC
  Administered 2019-10-12: 20 mg via INTRAVENOUS
  Filled 2019-10-12: qty 20

## 2019-10-12 MED ORDER — SODIUM CHLORIDE 0.9 % IV SOLN
Freq: Once | INTRAVENOUS | Status: AC
  Administered 2019-10-12: 13:00:00 via INTRAVENOUS

## 2019-10-12 MED ORDER — SODIUM CHLORIDE 0.9% FLUSH
10.0000 mL | INTRAVENOUS | Status: DC | PRN
  Administered 2019-10-12: 10 mL
  Filled 2019-10-12: qty 10

## 2019-10-12 MED ORDER — DEXTROSE 5 % IV SOLN
34.0000 mg/m2 | Freq: Once | INTRAVENOUS | Status: AC
  Administered 2019-10-12: 15:00:00 70 mg via INTRAVENOUS
  Filled 2019-10-12: qty 30

## 2019-10-12 MED ORDER — SODIUM CHLORIDE 0.9 % IV SOLN
INTRAVENOUS | Status: DC
  Administered 2019-10-12: 14:00:00 via INTRAVENOUS

## 2019-10-12 NOTE — Patient Instructions (Signed)
[facility] Discharge Instructions for Patients Receiving Chemotherapy   Beginning January 23rd 2017 lab work for the [facility] will be done in the  Main lab at [facility] on 1st floor. If you have a lab appointment with the [facility] please come in thru the  Main Entrance and check in at the main information desk   Today you received the following chemotherapy agents Kyprolis as well as Influenza vaccine. Follow-up as scheduled. Call clinic for any questions or concerns  To help prevent nausea and vomiting after your treatment, we encourage you to take your nausea medication   If you develop nausea and vomiting, or diarrhea that is not controlled by your medication, call the clinic.  The clinic phone number is [phone]. Office hours are Monday-Friday 8:30am-5:00pm.  BELOW ARE SYMPTOMS THAT SHOULD BE REPORTED IMMEDIATELY:  *FEVER GREATER THAN 101.0 F  *CHILLS WITH OR WITHOUT FEVER  NAUSEA AND VOMITING THAT IS NOT CONTROLLED WITH YOUR NAUSEA MEDICATION  *UNUSUAL SHORTNESS OF BREATH  *UNUSUAL BRUISING OR BLEEDING  TENDERNESS IN MOUTH AND THROAT WITH OR WITHOUT PRESENCE OF ULCERS  *URINARY PROBLEMS  *BOWEL PROBLEMS  UNUSUAL RASH Items with * indicate a potential emergency and should be followed up as soon as possible. If you have an emergency after office hours please contact your primary care physician or go to the nearest emergency department.  Please call the clinic during office hours if you have any questions or concerns.   You may also contact the Patient Navigator at [phone] should you have any questions or need assistance in obtaining follow up care.      Resources For Cancer Patients and their Caregivers ? American Cancer Society: Can assist with transportation, wigs, general needs, runs Look Good Feel Better.        (586)196-9887 ? Cancer Care: Provides financial assistance, online support groups, medication/co-pay  assistance.  1-800-813-HOPE 302-064-6617) ? [place] Assists [place] cancer patients and their families through emotional , educational and financial support.  816-238-1707 ? [place] DSS Where to apply for food stamps, Medicaid and utility assistance. (682)381-6366 ? [facility]: Transportation to medical appointments. 587-318-0246 ? Social Security Administration: May apply for disability if have a Stage IV cancer. 506-709-8908 870-490-2661 ? [employer], Disability and Transit Services: Assists with nutrition, care and transit needs. 409-075-1404

## 2019-10-12 NOTE — Progress Notes (Signed)
[place] reviewed with Dr. Delton Coombes and pt approved for Kyprolis injection today as well as Influenza vaccine per MD                                                                                            Johnny Lucas tolerated Kyprolis infusion and Influenza vaccine well without complaints or incident. VSS upon discharge. Pt discharged self ambulatory in satisfactory condition

## 2019-10-13 LAB — KAPPA/LAMBDA LIGHT CHAINS
Kappa free light chain: 51.7 mg/L — ABNORMAL HIGH (ref 3.3–19.4)
Kappa, lambda light chain ratio: 4.88 — ABNORMAL HIGH (ref 0.26–1.65)
Lambda free light chains: 10.6 mg/L (ref 5.7–26.3)

## 2019-10-13 LAB — PROTEIN ELECTROPHORESIS, SERUM
A/G Ratio: 2.1 — ABNORMAL HIGH (ref 0.7–1.7)
Albumin ELP: 3.8 g/dL (ref 2.9–4.4)
Alpha-1-Globulin: 0.2 g/dL (ref 0.0–0.4)
Alpha-2-Globulin: 0.7 g/dL (ref 0.4–1.0)
Beta Globulin: 0.7 g/dL (ref 0.7–1.3)
Gamma Globulin: 0.3 g/dL — ABNORMAL LOW (ref 0.4–1.8)
Globulin, Total: 1.8 g/dL — ABNORMAL LOW (ref 2.2–3.9)
M-Spike, %: 0.1 g/dL — ABNORMAL HIGH
Total Protein ELP: 5.6 g/dL — ABNORMAL LOW (ref 6.0–8.5)

## 2019-10-16 ENCOUNTER — Other Ambulatory Visit (HOSPITAL_COMMUNITY): Payer: Self-pay | Admitting: Nurse Practitioner

## 2019-10-16 DIAGNOSIS — C9000 Multiple myeloma not having achieved remission: Secondary | ICD-10-CM

## 2019-10-18 ENCOUNTER — Other Ambulatory Visit (HOSPITAL_COMMUNITY): Payer: Self-pay | Admitting: Hematology

## 2019-10-18 DIAGNOSIS — C9000 Multiple myeloma not having achieved remission: Secondary | ICD-10-CM

## 2019-10-25 ENCOUNTER — Other Ambulatory Visit (HOSPITAL_COMMUNITY): Payer: Self-pay

## 2019-10-25 ENCOUNTER — Other Ambulatory Visit: Payer: Self-pay

## 2019-10-25 DIAGNOSIS — C9000 Multiple myeloma not having achieved remission: Secondary | ICD-10-CM

## 2019-10-25 DIAGNOSIS — C50922 Malignant neoplasm of unspecified site of left male breast: Secondary | ICD-10-CM

## 2019-10-25 DIAGNOSIS — C7951 Secondary malignant neoplasm of bone: Secondary | ICD-10-CM

## 2019-10-25 DIAGNOSIS — D519 Vitamin B12 deficiency anemia, unspecified: Secondary | ICD-10-CM

## 2019-10-25 DIAGNOSIS — Z170 Estrogen receptor positive status [ER+]: Secondary | ICD-10-CM

## 2019-10-26 ENCOUNTER — Inpatient Hospital Stay (HOSPITAL_COMMUNITY): Payer: Medicare Other

## 2019-10-26 ENCOUNTER — Inpatient Hospital Stay (HOSPITAL_BASED_OUTPATIENT_CLINIC_OR_DEPARTMENT_OTHER): Payer: Medicare Other | Admitting: Hematology

## 2019-10-26 VITALS — BP 116/62 | HR 64 | Temp 97.3°F | Resp 16 | Wt 217.6 lb

## 2019-10-26 DIAGNOSIS — Z23 Encounter for immunization: Secondary | ICD-10-CM | POA: Diagnosis not present

## 2019-10-26 DIAGNOSIS — I129 Hypertensive chronic kidney disease with stage 1 through stage 4 chronic kidney disease, or unspecified chronic kidney disease: Secondary | ICD-10-CM | POA: Diagnosis not present

## 2019-10-26 DIAGNOSIS — C9000 Multiple myeloma not having achieved remission: Secondary | ICD-10-CM

## 2019-10-26 DIAGNOSIS — G629 Polyneuropathy, unspecified: Secondary | ICD-10-CM | POA: Diagnosis not present

## 2019-10-26 DIAGNOSIS — N183 Chronic kidney disease, stage 3 unspecified: Secondary | ICD-10-CM | POA: Diagnosis not present

## 2019-10-26 DIAGNOSIS — C7951 Secondary malignant neoplasm of bone: Secondary | ICD-10-CM

## 2019-10-26 DIAGNOSIS — D519 Vitamin B12 deficiency anemia, unspecified: Secondary | ICD-10-CM

## 2019-10-26 DIAGNOSIS — C50922 Malignant neoplasm of unspecified site of left male breast: Secondary | ICD-10-CM

## 2019-10-26 DIAGNOSIS — Z5111 Encounter for antineoplastic chemotherapy: Secondary | ICD-10-CM | POA: Diagnosis not present

## 2019-10-26 LAB — IRON AND TIBC
Iron: 74 ug/dL (ref 45–182)
Saturation Ratios: 21 % (ref 17.9–39.5)
TIBC: 353 ug/dL (ref 250–450)
UIBC: 279 ug/dL

## 2019-10-26 LAB — CBC WITH DIFFERENTIAL/PLATELET
Abs Immature Granulocytes: 0 10*3/uL (ref 0.00–0.07)
Basophils Absolute: 0 10*3/uL (ref 0.0–0.1)
Basophils Relative: 1 %
Eosinophils Absolute: 0.3 10*3/uL (ref 0.0–0.5)
Eosinophils Relative: 6 %
HCT: 31.6 % — ABNORMAL LOW (ref 39.0–52.0)
Hemoglobin: 10.3 g/dL — ABNORMAL LOW (ref 13.0–17.0)
Immature Granulocytes: 0 %
Lymphocytes Relative: 23 %
Lymphs Abs: 1.2 10*3/uL (ref 0.7–4.0)
MCH: 36.4 pg — ABNORMAL HIGH (ref 26.0–34.0)
MCHC: 32.6 g/dL (ref 30.0–36.0)
MCV: 111.7 fL — ABNORMAL HIGH (ref 80.0–100.0)
Monocytes Absolute: 0.5 10*3/uL (ref 0.1–1.0)
Monocytes Relative: 9 %
Neutro Abs: 3.4 10*3/uL (ref 1.7–7.7)
Neutrophils Relative %: 61 %
Platelets: 192 10*3/uL (ref 150–400)
RBC: 2.83 MIL/uL — ABNORMAL LOW (ref 4.22–5.81)
RDW: 13.3 % (ref 11.5–15.5)
WBC: 5.4 10*3/uL (ref 4.0–10.5)
nRBC: 0 % (ref 0.0–0.2)

## 2019-10-26 LAB — COMPREHENSIVE METABOLIC PANEL
ALT: 13 U/L (ref 0–44)
AST: 16 U/L (ref 15–41)
Albumin: 3.8 g/dL (ref 3.5–5.0)
Alkaline Phosphatase: 28 U/L — ABNORMAL LOW (ref 38–126)
Anion gap: 9 (ref 5–15)
BUN: 16 mg/dL (ref 8–23)
CO2: 24 mmol/L (ref 22–32)
Calcium: 8.9 mg/dL (ref 8.9–10.3)
Chloride: 107 mmol/L (ref 98–111)
Creatinine, Ser: 1.65 mg/dL — ABNORMAL HIGH (ref 0.61–1.24)
GFR calc Af Amer: 48 mL/min — ABNORMAL LOW (ref >60)
GFR calc non Af Amer: 41 mL/min — ABNORMAL LOW (ref >60)
Glucose, Bld: 115 mg/dL — ABNORMAL HIGH (ref 70–99)
Potassium: 4.1 mmol/L (ref 3.5–5.1)
Sodium: 140 mmol/L (ref 135–145)
Total Bilirubin: 0.6 mg/dL (ref 0.3–1.2)
Total Protein: 6 g/dL — ABNORMAL LOW (ref 6.5–8.1)

## 2019-10-26 LAB — FOLATE: Folate: 65.2 ng/mL (ref >5.9)

## 2019-10-26 LAB — VITAMIN B12: Vitamin B-12: 1031 pg/mL — ABNORMAL HIGH (ref 180–914)

## 2019-10-26 LAB — LACTATE DEHYDROGENASE: LDH: 137 U/L (ref 98–192)

## 2019-10-26 LAB — FERRITIN: Ferritin: 407 ng/mL — ABNORMAL HIGH (ref 24–336)

## 2019-10-26 MED ORDER — HEPARIN SOD (PORK) LOCK FLUSH 100 UNIT/ML IV SOLN
500.0000 [IU] | Freq: Once | INTRAVENOUS | Status: AC | PRN
  Administered 2019-10-26: 500 [IU]

## 2019-10-26 MED ORDER — SODIUM CHLORIDE 0.9 % IV SOLN
Freq: Once | INTRAVENOUS | Status: AC
  Administered 2019-10-26: 12:00:00 via INTRAVENOUS

## 2019-10-26 MED ORDER — SODIUM CHLORIDE 0.9% FLUSH
10.0000 mL | INTRAVENOUS | Status: DC | PRN
  Administered 2019-10-26: 13:00:00 10 mL
  Filled 2019-10-26: qty 10

## 2019-10-26 MED ORDER — SODIUM CHLORIDE 0.9 % IV SOLN
20.0000 mg | Freq: Once | INTRAVENOUS | Status: AC
  Administered 2019-10-26: 20 mg via INTRAVENOUS
  Filled 2019-10-26: qty 20

## 2019-10-26 MED ORDER — DEXTROSE 5 % IV SOLN
34.0000 mg/m2 | Freq: Once | INTRAVENOUS | Status: AC
  Administered 2019-10-26: 70 mg via INTRAVENOUS
  Filled 2019-10-26: qty 30

## 2019-10-26 MED ORDER — ACETAMINOPHEN 325 MG PO TABS
650.0000 mg | ORAL_TABLET | Freq: Once | ORAL | Status: AC
  Administered 2019-10-26: 650 mg via ORAL
  Filled 2019-10-26: qty 2

## 2019-10-26 MED ORDER — SODIUM CHLORIDE 0.9 % IV SOLN
Freq: Once | INTRAVENOUS | Status: AC
  Administered 2019-10-26: 11:00:00 via INTRAVENOUS

## 2019-10-26 NOTE — Patient Instructions (Addendum)
[place] [facility] Discharge Instructions  You were seen today by Dr. Delton Coombes. He went over your recent lab results. You will come back in 2 weeks for labs and treatment. He will see you back in 4 weeks for labs, treatment and follow up.   Thank you for choosing [place] at [facility] to provide your oncology and hematology care.  To afford each patient quality time with our provider, please arrive at least 15 minutes before your scheduled appointment time.   If you have a lab appointment with the [facility] please come in thru the  Main Entrance and check in at the main information desk  You need to re-schedule your appointment should you arrive 10 or more minutes late.  We strive to give you quality time with our providers, and arriving late affects you and other patients whose appointments are after yours.  Also, if you no show three or more times for appointments you may be dismissed from the clinic at the providers discretion.     Again, thank you for choosing [facility].  Our hope is that these requests will decrease the amount of time that you wait before being seen by our physicians.       _____________________________________________________________  Should you have questions after your visit to [facility], please contact our office at [phone] between the hours of 8:00 a.m. and 4:30 p.m.  Voicemails left after 4:00 p.m. will not be returned until the following business day.  For prescription refill requests, have your pharmacy contact our office and allow 72 hours.    Cancer Center Support Programs:   > Cancer Support Group  2nd Tuesday of the month 1pm-2pm, Journey Room

## 2019-10-26 NOTE — Patient Instructions (Signed)
[facility] Discharge Instructions for Patients Receiving Chemotherapy  Today you received the following chemotherapy agents   To help prevent nausea and vomiting after your treatment, we encourage you to take your nausea medication   If you develop nausea and vomiting that is not controlled by your nausea medication, call the clinic.   BELOW ARE SYMPTOMS THAT SHOULD BE REPORTED IMMEDIATELY:  *FEVER GREATER THAN 100.5 F  *CHILLS WITH OR WITHOUT FEVER  NAUSEA AND VOMITING THAT IS NOT CONTROLLED WITH YOUR NAUSEA MEDICATION  *UNUSUAL SHORTNESS OF BREATH  *UNUSUAL BRUISING OR BLEEDING  TENDERNESS IN MOUTH AND THROAT WITH OR WITHOUT PRESENCE OF ULCERS  *URINARY PROBLEMS  *BOWEL PROBLEMS  UNUSUAL RASH Items with * indicate a potential emergency and should be followed up as soon as possible.  Feel free to call the clinic should you have any questions or concerns. The clinic phone number is [phone].  Please show the CHEMO ALERT CARD at check-in to the Emergency Department and triage nurse.   

## 2019-10-26 NOTE — Progress Notes (Signed)
[address] [place], [place] [address]   CLINIC:  Medical Oncology/Hematology  PCP:  [name], MD [place] [id number] [phone]   REASON FOR VISIT:  Follow-up for Multiple Myeloma  CURRENT THERAPY: Carfilzomib   BRIEF ONCOLOGIC HISTORY:  Oncology History  Breast cancer, male ([place])  12/31/2009 Initial Biopsy   Biopsy of L breast    12/31/2009 Pathology Results   Invasive ductal carcinoma, ER/PR+, HER 2 negative   12/31/2009 Imaging   Ultrasound showing a 2.43 x 1.85 x 3 cm hypoechoic spiculated mass in the 12 o clock L breast retroareolar region   01/01/2010 -  Anti-estrogen oral therapy   Tamoxifen 20 mg daily   01/06/2010 Imaging   Bone scan abnormal uptake in the diaphysis of the R humerus, abnormal in the R third, fifth and sixth ribs, lesion also noted in the sternum.   02/03/2010 Surgery   Rod placement and fixation of R humerus by Dr. [name]   02/05/2010 - 02/18/2010 Radiation Therapy   30Gy in 10 fractions of 3 Gy per fraction to R pathologic fracture   03/11/2010 -  Chemotherapy   Denosumab monthly, now every 3 months. Started at [facility]    06/09/2016 Imaging   Three hypermetabolic osseous lesions in the sternum, left ilium and right ilium, as discussed above, likely represent osseous metastases. At this time, these are not recognizable on the CT images. 2. No extra skeletal metastatic disease identified in the neck, chest, abdomen or pelvis.   10/13/2016 Progression   PET shows various new and enlarging osseous metastatic lesions with no definite extra osseous metastatic disease currently identified.    12/31/2016 Progression   1. Multifocal hypermetabolic osseous metastases throughout the axial and proximal appendicular skeleton, which are increased in size, number and metabolism since 10/13/2016 PET-CT. 2. 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 3. No additional sites of hypermetabolic metastatic disease. 4. Chronic right mastoid sinusitis. 5. Aortic atherosclerosis.  One vessel coronary atherosclerosis.   Multiple myeloma ([place])  02/12/2017 Bone Marrow Biopsy   The marrow was variably cellular with large peritrabecular aggregates of kappa restricted plasma cells (66% by aspirate, 30% by Cd138). Cytogenetics +11.    03/01/2017 - 06/29/2017 Chemotherapy   RVD    05/26/2017 Bone Marrow Biopsy   Performed at [facility]:  Plasma cell myeloma in a 30% cellularmarrow with decreased trilineage hematopoiesis and 42% kappalight chain restricted plasma cells on the aspirate smears andlarge aggregates on the core biopsy.    07/12/2017 - 09/01/2017 Chemotherapy   3 cycles of carfizolmib/cyclophosphamide/dexamethasone     10/08/2017 Bone Marrow Transplant   Autotransplant at [place]: Cancer Staging Multiple myeloma [facility]) Staging form: Plasma Cell Myeloma and Plasma Cell Disorders, AJCC 8th Edition - Clinical stage from 05/03/2017: Beta-2-microglobulin (mg/L): 3.5, Albumin (g/dL): 3.4, ISS: Stage II, High-risk cytogenetics: Absent, LDH: Not assessed - Signed by B[facility] Cancer, PA-C on 05/03/2017    INTERVAL HISTORY:  Mr. Ericsson 73 y.o. male seen for follow-up of multiple myeloma.  He is continuing to tolerate carfilzomib very well.  Appetite is 100%.  Energy levels are 50%.  He had developed some erythematous rash on the left foot dorsum.  It was itching.  He started using antifungal cream which has led to improvement in the rash.  It is  not completely gone yet.  Denies any fevers or chills.  Denies any nausea, vomiting, diarrhea or constipation.  No signs of PND or orthopnea.  Numbness has been stable.  REVIEW OF SYSTEMS:  Review of Systems  Neurological: Positive for numbness.  All other systems reviewed and are negative.    PAST  MEDICAL/SURGICAL HISTORY:  Past Medical History:  Diagnosis Date  . Anxiety   . Bone metastases ([place]) 09/10/2016  . Breast cancer ([place]) 2011   Stave IV breast cancer; radiation and tamoxifen  . Breast cancer, male ([place])    Stave IV breast cancer; radiation and tamoxifen  Overview:  Left breast ca with mets bone  Overview:  METS TO BONE  . GERD (gastroesophageal reflux disease)   . Hypertension   . Macular degeneration   . Multiple myeloma ([place]) 02/18/2017  . Peripheral neuropathy    Past Surgical History:  Procedure Laterality Date  . BACK SURGERY    . CATARACT EXTRACTION W/PHACO Left 02/03/2019   Procedure: CATARACT EXTRACTION PHACO AND INTRAOCULAR LENS PLACEMENT ([place]);  Surgeon: Baruch Goldmann, MD;  Location: [facility];  Service: Ophthalmology;  Laterality: Left;  CDE: 15.89  . HERNIA REPAIR    . PORTACATH PLACEMENT Left 06/10/2018   Procedure: INSERTION PORT-A-CATH;  Surgeon: Virl Cagey, MD;  Location: [facility];  Service: General;  Laterality: Left;  . right arm surgery       SOCIAL HISTORY:  Social History   Socioeconomic History  . Marital status: Married    Spouse name: Not on file  . Number of children: 3  . Years of education: Not on file  . Highest education level: Not on file  Occupational History  . Not on file  Social Needs  . Financial resource strain: Not on file  . Food insecurity    Worry: Not on file    Inability: Not on file  . Transportation needs    Medical: Not on file    Non-medical: Not on file  Tobacco Use  . Smoking status: Never Smoker  . Smokeless tobacco: Former Network engineer and Sexual Activity  . Alcohol use: Yes    Alcohol/week: 24.0 standard drinks    Types: 24 Cans of beer per week  . Drug use: No  . Sexual activity: Not on file  Lifestyle  . Physical activity    Days per week: Not on file    Minutes per session: Not on file  . Stress: Not on file  Relationships  . Social Herbalist on phone: Not on file    Gets  together: Not on file    Attends religious service: Not on file    Active member of club or organization: Not on file    Attends meetings of clubs or organizations: Not on file    Relationship status: Not on file  . Intimate partner violence    Fear of current or ex partner: Not on file    Emotionally abused: Not on file    Physically abused: Not on file    Forced sexual activity: Not on file  Other Topics Concern  . Not on file  Social History Narrative  . Not on file    FAMILY HISTORY:  Family History  Problem Relation Age of Onset  . Stroke Mother   . Cancer Maternal Aunt        cancer NOS; died in her 47s  . Lung cancer Maternal Uncle  smoker    CURRENT MEDICATIONS:  Outpatient Encounter Medications as of 10/26/2019  Medication Sig Note  . acyclovir (ZOVIRAX) 800 MG tablet TAKE 1 TABLET TWICE DAILY   . ALPRAZolam (XANAX) 0.5 MG tablet TAKE 1 TABLET BY MOUTH AT BEDTIME AS NEEDED FOR ANXIETY OR SLEEP   . amLODipine (NORVASC) 5 MG tablet Take 1 tablet (5 mg total) by mouth daily.   Marland Kitchen aspirin EC 81 MG tablet Take 81 mg by mouth daily.   . calcium-vitamin D (OSCAL WITH D) 500-200 MG-UNIT TABS tablet Take 1 tablet by mouth 2 (two) times daily.   Marland Kitchen CARFILZOMIB IV Inject into the vein.   . Cholecalciferol (VITAMIN D) 50 MCG (2000 UT) CAPS Take 4,000 Units by mouth 2 (two) times daily.   Marland Kitchen denosumab (XGEVA) 120 MG/1.7ML SOLN injection Inject 120 mg into the skin once. 01/18/2019: [place]   . folic acid (FOLVITE) 1 MG tablet TAKE 1 TABLET BY MOUTH DAILY   . gabapentin (NEURONTIN) 300 MG capsule Take 1 capsule (300 mg total) by mouth 2 (two) times daily. Take two times a day, then increase to three times a day in a few weeks. (Patient taking differently: Take 300 mg by mouth 2 (two) times daily. )   . hydrALAZINE (APRESOLINE) 50 MG tablet Take 1 tablet (50 mg total) by mouth every 8 (eight) hours. (Patient taking differently: Take 50 mg by mouth 2 (two) times daily. )    . loratadine (CLARITIN) 10 MG tablet Take 1 tablet (10 mg total) by mouth daily.   . metoprolol succinate (TOPROL-XL) 100 MG 24 hr tablet Take 100 mg by mouth daily.    . Multiple Vitamin (MULTI-VITAMIN) tablet Take by mouth.   . Multiple Vitamin (MULTIVITAMIN WITH MINERALS) TABS tablet Take 1 tablet by mouth daily.   . ondansetron (ZOFRAN) 8 MG tablet TAKE ONE TABLET BY MOUTH EVERY 8 HOURS AS NEEDED   . pantoprazole (PROTONIX) 40 MG tablet Take 40 mg by mouth every other day.    . polyethylene glycol (MIRALAX / GLYCOLAX) packet Take 17 g by mouth daily as needed for mild constipation.   . prochlorperazine (COMPAZINE) 10 MG tablet Take 1 tablet (10 mg total) by mouth every 6 (six) hours as needed for nausea or vomiting.   . pseudoephedrine-guaifenesin (MUCINEX D) 60-600 MG 12 hr tablet Take 1 tablet by mouth 2 (two) times daily as needed for congestion.    . tamoxifen (NOLVADEX) 20 MG tablet Take 1 tablet (20 mg total) by mouth daily.   . vitamin B-12 (CYANOCOBALAMIN) 1000 MCG tablet Take 1,000 mcg by mouth daily.   . [DISCONTINUED] ALPRAZolam (XANAX) 0.5 MG tablet TAKE 1 TABLET BY MOUTH AT BEDTIME AS NEEDED FOR ANXIETY OR SLEEP    Facility-Administered Encounter Medications as of 10/26/2019  Medication  . heparin lock flush 100 unit/mL  . sodium chloride flush (NS) 0.9 % injection 10 mL  . sodium chloride flush (NS) 0.9 % injection 10 mL    ALLERGIES:  Allergies  Allergen Reactions  . Cefepime     Suspected severe thrombocytopenia is a result of cefepime induced antigen platelet destruction     PHYSICAL EXAM:  ECOG Performance status: 1  There were no vitals filed for this visit. There were no vitals filed for this visit.  Physical Exam Constitutional:      Appearance: Normal appearance.  HENT:     Head: Normocephalic.     Nose: Nose normal.     Mouth/Throat:  Mouth: Mucous membranes are moist.     Pharynx: Oropharynx is clear.  Eyes:     Extraocular Movements:  Extraocular movements intact.     Conjunctiva/sclera: Conjunctivae normal.  Neck:     Musculoskeletal: Normal range of motion.  Cardiovascular:     Rate and Rhythm: Normal rate and regular rhythm.     Pulses: Normal pulses.     Heart sounds: Normal heart sounds.  Pulmonary:     Effort: Pulmonary effort is normal.     Breath sounds: Normal breath sounds.  Abdominal:     General: Bowel sounds are normal.     Palpations: Abdomen is soft.  Musculoskeletal: Normal range of motion.  Skin:    General: Skin is warm and dry.  Neurological:     General: No focal deficit present.     Mental Status: He is alert and oriented to person, place, and time. Mental status is at baseline.  Psychiatric:        Mood and Affect: Mood normal.        Behavior: Behavior normal.        Thought Content: Thought content normal.        Judgment: Judgment normal.      LABORATORY DATA:  I have reviewed the labs as listed.  CBC    Component Value Date/Time   WBC 5.4 10/26/2019 0914   RBC 2.83 (L) 10/26/2019 0914   HGB 10.3 (L) 10/26/2019 0914   HCT 31.6 (L) 10/26/2019 0914   PLT 192 10/26/2019 0914   MCV 111.7 (H) 10/26/2019 0914   MCH 36.4 (H) 10/26/2019 0914   MCHC 32.6 10/26/2019 0914   RDW 13.3 10/26/2019 0914   LYMPHSABS 1.2 10/26/2019 0914   MONOABS 0.5 10/26/2019 0914   EOSABS 0.3 10/26/2019 0914   BASOSABS 0.0 10/26/2019 0914   CMP Latest Ref Rng & Units 10/26/2019 10/12/2019 09/28/2019  Glucose 70 - 99 mg/dL 115(H) 110(H) 123(H)  BUN 8 - 23 mg/dL _0 Creatinine 0.61 - 1.24 mg/dL 1.65(H) 1.63(H) 1.74(H)  Sodium 135 - 145 mmol/L 140 140 138  Potassium 3.5 - 5.1 mmol/L 4.1 4.1 4.1  Chloride 98 - 111 mmol/L 107 106 103  CO2 22 - 32 mmol/L _1 Calcium 8.9 - 10.3 mg/dL 8.9 9.2 9.7  Total Protein 6.5 - 8.1 g/dL 6.0(L) 6.0(L) 6.0(L)  Total Bilirubin 0.3 - 1.2 mg/dL 0.6 0.3 0.6  Alkaline Phos 38 - 126 U/L 28(L) 26(L) 29(L)  AST 15 - 41 U/L _2 ALT 0 - 44 U/L _3 Radiology: I have reviewed his previous scans.  ASSESSMENT & PLAN:   Multiple myeloma ([place]) 1.  Stage II IgG kappa multiple myeloma (Dx February 2018): -RVD from 03/01/2017 through 06/29/2017 -Chemotherapy changed to 3 cycles of CCyD from 07/12/2017-04/2017 to obtain deeper response - Status post auto stem cell transplant on 10/08/2017 -Stable M spike after transplant, started on CCyD, cycle 1 on 02/14/2018, cycle 2 on 03/14/2018 -His M spike after 2 cycles was undetectable, when measured at [facility] last week.  He was recommended to have maintenance with carfilzomib 70 mg/m square on days 1 and 15 every 28 days by Dr. Norma Fredrickson. -He received his first carfilzomib 15m/m2 on 04/20/2018, developed fever with chills 3 hours later with weakness in the extremities.  He was admitted to the hospital, received 1 unit of blood transfusion.  Blood cultures turned out to be negative.  He was  febrile after hospitalization and was sent home. - Subsequently he tolerated carfilzomib and smaller doses very well.  We have increased his doses to 60 mg/m on 09/01/2018 and he tolerated 4 doses very well with the addition of dexamethasone. - Carfilzomib dose was increased to 70 mg per metered square on 10/27/2018.   -He was hospitalized on 11/29/2018 through 12/05/2018 with bronchitis, positive for parainfluenza, acute renal failure and severe thrombocytopenia.  His platelet count went down to 9.  We have given IVIG 1 g/kg x 2 doses.  Platelet count has slowly improved. - Review of literature showed carfilzomib can cause dose-related thrombotic microangiopathy. -Carfilzomib restarted at reduced dose of 35 mg per metered square on 01/05/2019. -We reviewed lab results from 10/12/2019.  M spike is 0.1 g and stable.  Free light chain ratio is 4.88, previously 4.69.  Kappa free kappa light chains are 51.7, previously 57. -We will proceed with carfilzomib every 2 weeks. -I will plan to see him back in 1 month and monitor myeloma  labs closely.   2.  Metastatic left breast cancer to the bones: -Diagnosed in 2011.  He status post lumpectomy.  He will continue tamoxifen.  3.  Peripheral neuropathy: -His neuropathy has been stable.  He will continue gabapentin 300 mg twice daily.  4.  ID prophylaxis: -He will continue acyclovir 400 mg twice daily.  5.  Bone strengthening: -Continue denosumab.  Continue calcium supplements.  6.  Macrocytic anemia: -Etiology stage III CKD.  Last Feraheme on 04/27/2019. -He has not received Procrit since 06/06/2019 as his insurance is not covering it.  His hemoglobin today is 10.3.  We will continue to monitor his ferritin and iron panel.     Total time spent is 25 minutes with more than 50% of the time spent face-to-face discussing myeloma labs, further treatment plan, counseling and coordination of care.  Orders placed this encounter:  Orders Placed This Encounter  Procedures  . CBC with Differential/Platelet  . Comprehensive metabolic panel  . Magnesium      Derek Jack, MD [place] [phone]

## 2019-10-26 NOTE — Progress Notes (Signed)
Pt presents today for tx and f/u with Dr. Delton Coombes. Labs pending. MAR reviewed. Pt has no complaints of pain. Pt has complaints of a rash on his left foot the his Pharmacist gave him a topical for ringworm. Instructed pt to show Dr. Delton Coombes. Upon assessment L foot is red, circular in shape and scaly.   Creat 1.65. Labs reviewed by Dr. Delton Coombes. MD aware.   Treatment given today per MD orders. Tolerated infusion without adverse affects. Vital signs stable. No complaints at this time. Discharged from clinic ambulatory. F/U with [facility] as scheduled.

## 2019-10-27 ENCOUNTER — Encounter (HOSPITAL_COMMUNITY): Payer: Self-pay | Admitting: Hematology

## 2019-10-27 LAB — PROTEIN ELECTROPHORESIS, SERUM
A/G Ratio: 1.6 (ref 0.7–1.7)
Albumin ELP: 3.6 g/dL (ref 2.9–4.4)
Alpha-1-Globulin: 0.2 g/dL (ref 0.0–0.4)
Alpha-2-Globulin: 0.7 g/dL (ref 0.4–1.0)
Beta Globulin: 0.8 g/dL (ref 0.7–1.3)
Gamma Globulin: 0.4 g/dL (ref 0.4–1.8)
Globulin, Total: 2.2 g/dL (ref 2.2–3.9)
M-Spike, %: 0.2 g/dL — ABNORMAL HIGH
Total Protein ELP: 5.8 g/dL — ABNORMAL LOW (ref 6.0–8.5)

## 2019-10-27 LAB — KAPPA/LAMBDA LIGHT CHAINS
Kappa free light chain: 61.7 mg/L — ABNORMAL HIGH (ref 3.3–19.4)
Kappa, lambda light chain ratio: 7.71 — ABNORMAL HIGH (ref 0.26–1.65)
Lambda free light chains: 8 mg/L (ref 5.7–26.3)

## 2019-10-27 NOTE — Assessment & Plan Note (Signed)
1.  Stage II IgG kappa multiple myeloma (Dx February 2018): -RVD from 03/01/2017 through 06/29/2017 -Chemotherapy changed to 3 cycles of CCyD from 07/12/2017-04/2017 to obtain deeper response - Status post auto stem cell transplant on 10/08/2017 -Stable M spike after transplant, started on CCyD, cycle 1 on 02/14/2018, cycle 2 on 03/14/2018 -His M spike after 2 cycles was undetectable, when measured at [facility] last week.  He was recommended to have maintenance with carfilzomib 70 mg/m square on days 1 and 15 every 28 days by Dr. Norma Fredrickson. -He received his first carfilzomib 40m/m2 on 04/20/2018, developed fever with chills 3 hours later with weakness in the extremities.  He was admitted to the hospital, received 1 unit of blood transfusion.  Blood cultures turned out to be negative.  He was febrile after hospitalization and was sent home. - Subsequently he tolerated carfilzomib and smaller doses very well.  We have increased his doses to 60 mg/m on 09/01/2018 and he tolerated 4 doses very well with the addition of dexamethasone. - Carfilzomib dose was increased to 70 mg per metered square on 10/27/2018.   -He was hospitalized on 11/29/2018 through 12/05/2018 with bronchitis, positive for parainfluenza, acute renal failure and severe thrombocytopenia.  His platelet count went down to 9.  We have given IVIG 1 g/kg x 2 doses.  Platelet count has slowly improved. - Review of literature showed carfilzomib can cause dose-related thrombotic microangiopathy. -Carfilzomib restarted at reduced dose of 35 mg per metered square on 01/05/2019. -We reviewed lab results from 10/12/2019.  M spike is 0.1 g and stable.  Free light chain ratio is 4.88, previously 4.69.  Kappa free kappa light chains are 51.7, previously 57. -We will proceed with carfilzomib every 2 weeks. -I will plan to see him back in 1 month and monitor myeloma labs closely.   2.  Metastatic left breast cancer to the bones: -Diagnosed in 2011.  He status  post lumpectomy.  He will continue tamoxifen.  3.  Peripheral neuropathy: -His neuropathy has been stable.  He will continue gabapentin 300 mg twice daily.  4.  ID prophylaxis: -He will continue acyclovir 400 mg twice daily.  5.  Bone strengthening: -Continue denosumab.  Continue calcium supplements.  6.  Macrocytic anemia: -Etiology stage III CKD.  Last Feraheme on 04/27/2019. -He has not received Procrit since 06/06/2019 as his insurance is not covering it.  His hemoglobin today is 10.3.  We will continue to monitor his ferritin and iron panel.

## 2019-11-09 ENCOUNTER — Ambulatory Visit (HOSPITAL_COMMUNITY): Payer: Medicare Other | Admitting: Hematology

## 2019-11-09 ENCOUNTER — Ambulatory Visit (HOSPITAL_COMMUNITY): Payer: Medicare Other

## 2019-11-09 ENCOUNTER — Inpatient Hospital Stay (HOSPITAL_COMMUNITY): Payer: Medicare Other

## 2019-11-09 ENCOUNTER — Other Ambulatory Visit (HOSPITAL_COMMUNITY): Payer: Medicare Other

## 2019-11-09 ENCOUNTER — Inpatient Hospital Stay (HOSPITAL_COMMUNITY): Payer: Medicare Other | Attending: Hematology

## 2019-11-09 ENCOUNTER — Other Ambulatory Visit: Payer: Self-pay

## 2019-11-09 VITALS — BP 133/72 | HR 74 | Temp 97.7°F | Resp 18 | Wt 220.0 lb

## 2019-11-09 DIAGNOSIS — Z79899 Other long term (current) drug therapy: Secondary | ICD-10-CM | POA: Insufficient documentation

## 2019-11-09 DIAGNOSIS — C9000 Multiple myeloma not having achieved remission: Secondary | ICD-10-CM | POA: Diagnosis not present

## 2019-11-09 DIAGNOSIS — Z5111 Encounter for antineoplastic chemotherapy: Secondary | ICD-10-CM | POA: Diagnosis not present

## 2019-11-09 LAB — CBC WITH DIFFERENTIAL/PLATELET
Abs Immature Granulocytes: 0.01 10*3/uL (ref 0.00–0.07)
Basophils Absolute: 0 10*3/uL (ref 0.0–0.1)
Basophils Relative: 1 %
Eosinophils Absolute: 0.2 10*3/uL (ref 0.0–0.5)
Eosinophils Relative: 4 %
HCT: 32.5 % — ABNORMAL LOW (ref 39.0–52.0)
Hemoglobin: 10.6 g/dL — ABNORMAL LOW (ref 13.0–17.0)
Immature Granulocytes: 0 %
Lymphocytes Relative: 22 %
Lymphs Abs: 1.4 10*3/uL (ref 0.7–4.0)
MCH: 35.9 pg — ABNORMAL HIGH (ref 26.0–34.0)
MCHC: 32.6 g/dL (ref 30.0–36.0)
MCV: 110.2 fL — ABNORMAL HIGH (ref 80.0–100.0)
Monocytes Absolute: 0.6 10*3/uL (ref 0.1–1.0)
Monocytes Relative: 9 %
Neutro Abs: 4.1 10*3/uL (ref 1.7–7.7)
Neutrophils Relative %: 64 %
Platelets: 200 10*3/uL (ref 150–400)
RBC: 2.95 MIL/uL — ABNORMAL LOW (ref 4.22–5.81)
RDW: 13 % (ref 11.5–15.5)
WBC: 6.3 10*3/uL (ref 4.0–10.5)
nRBC: 0 % (ref 0.0–0.2)

## 2019-11-09 LAB — COMPREHENSIVE METABOLIC PANEL
ALT: 17 U/L (ref 0–44)
AST: 16 U/L (ref 15–41)
Albumin: 4 g/dL (ref 3.5–5.0)
Alkaline Phosphatase: 29 U/L — ABNORMAL LOW (ref 38–126)
Anion gap: 10 (ref 5–15)
BUN: 17 mg/dL (ref 8–23)
CO2: 26 mmol/L (ref 22–32)
Calcium: 9.4 mg/dL (ref 8.9–10.3)
Chloride: 105 mmol/L (ref 98–111)
Creatinine, Ser: 1.7 mg/dL — ABNORMAL HIGH (ref 0.61–1.24)
GFR calc Af Amer: 46 mL/min — ABNORMAL LOW (ref >60)
GFR calc non Af Amer: 40 mL/min — ABNORMAL LOW (ref >60)
Glucose, Bld: 99 mg/dL (ref 70–99)
Potassium: 4.2 mmol/L (ref 3.5–5.1)
Sodium: 141 mmol/L (ref 135–145)
Total Bilirubin: 0.5 mg/dL (ref 0.3–1.2)
Total Protein: 6.2 g/dL — ABNORMAL LOW (ref 6.5–8.1)

## 2019-11-09 LAB — MAGNESIUM: Magnesium: 2.3 mg/dL (ref 1.7–2.4)

## 2019-11-09 MED ORDER — ACETAMINOPHEN 325 MG PO TABS
650.0000 mg | ORAL_TABLET | Freq: Once | ORAL | Status: AC
  Administered 2019-11-09: 650 mg via ORAL

## 2019-11-09 MED ORDER — SODIUM CHLORIDE 0.9 % IV SOLN
Freq: Once | INTRAVENOUS | Status: AC
  Administered 2019-11-09: 13:00:00 via INTRAVENOUS

## 2019-11-09 MED ORDER — SODIUM CHLORIDE 0.9% FLUSH
10.0000 mL | INTRAVENOUS | Status: DC | PRN
  Administered 2019-11-09: 10 mL
  Filled 2019-11-09: qty 10

## 2019-11-09 MED ORDER — SODIUM CHLORIDE 0.9 % IV SOLN
Freq: Once | INTRAVENOUS | Status: AC
  Administered 2019-11-09: 15:00:00 via INTRAVENOUS

## 2019-11-09 MED ORDER — HEPARIN SOD (PORK) LOCK FLUSH 100 UNIT/ML IV SOLN
500.0000 [IU] | Freq: Once | INTRAVENOUS | Status: AC | PRN
  Administered 2019-11-09: 500 [IU]

## 2019-11-09 MED ORDER — DEXTROSE 5 % IV SOLN
34.0000 mg/m2 | Freq: Once | INTRAVENOUS | Status: AC
  Administered 2019-11-09: 70 mg via INTRAVENOUS
  Filled 2019-11-09: qty 30

## 2019-11-09 MED ORDER — SODIUM CHLORIDE 0.9 % IV SOLN
20.0000 mg | Freq: Once | INTRAVENOUS | Status: AC
  Administered 2019-11-09: 20 mg via INTRAVENOUS
  Filled 2019-11-09: qty 20

## 2019-11-09 NOTE — Progress Notes (Signed)
Labs reviewed with MD. Proceed as planned. Creatinine noted 1.70  Patient has had a recent root canal, finished his antibiotics on Sunday the 8th of November. Per Dr. Delton Coombes , ok to proceed today. xgeva on hold till December .   Treatment given per orders. Patient tolerated it well without problems. Vitals stable and discharged home from clinic ambulatory. Follow up as scheduled.

## 2019-11-09 NOTE — Patient Instructions (Signed)
[facility] Discharge Instructions for Patients Receiving Chemotherapy  Today you received the following chemotherapy agents   To help prevent nausea and vomiting after your treatment, we encourage you to take your nausea medication   If you develop nausea and vomiting that is not controlled by your nausea medication, call the clinic.   BELOW ARE SYMPTOMS THAT SHOULD BE REPORTED IMMEDIATELY:  *FEVER GREATER THAN 100.5 F  *CHILLS WITH OR WITHOUT FEVER  NAUSEA AND VOMITING THAT IS NOT CONTROLLED WITH YOUR NAUSEA MEDICATION  *UNUSUAL SHORTNESS OF BREATH  *UNUSUAL BRUISING OR BLEEDING  TENDERNESS IN MOUTH AND THROAT WITH OR WITHOUT PRESENCE OF ULCERS  *URINARY PROBLEMS  *BOWEL PROBLEMS  UNUSUAL RASH Items with * indicate a potential emergency and should be followed up as soon as possible.  Feel free to call the clinic should you have any questions or concerns. The clinic phone number is [phone].  Please show the CHEMO ALERT CARD at check-in to the Emergency Department and triage nurse.   

## 2019-11-13 ENCOUNTER — Other Ambulatory Visit (HOSPITAL_COMMUNITY): Payer: Self-pay | Admitting: Hematology

## 2019-11-13 DIAGNOSIS — C9000 Multiple myeloma not having achieved remission: Secondary | ICD-10-CM

## 2019-11-22 ENCOUNTER — Other Ambulatory Visit (HOSPITAL_COMMUNITY): Payer: Self-pay | Admitting: *Deleted

## 2019-11-22 DIAGNOSIS — C9000 Multiple myeloma not having achieved remission: Secondary | ICD-10-CM

## 2019-11-22 DIAGNOSIS — C7951 Secondary malignant neoplasm of bone: Secondary | ICD-10-CM

## 2019-11-27 ENCOUNTER — Other Ambulatory Visit: Payer: Self-pay

## 2019-11-27 ENCOUNTER — Inpatient Hospital Stay (HOSPITAL_COMMUNITY): Payer: Medicare Other

## 2019-11-27 VITALS — BP 135/71 | HR 69 | Temp 97.8°F | Resp 18 | Wt 217.8 lb

## 2019-11-27 DIAGNOSIS — C9000 Multiple myeloma not having achieved remission: Secondary | ICD-10-CM | POA: Diagnosis not present

## 2019-11-27 DIAGNOSIS — C7951 Secondary malignant neoplasm of bone: Secondary | ICD-10-CM

## 2019-11-27 DIAGNOSIS — Z5111 Encounter for antineoplastic chemotherapy: Secondary | ICD-10-CM | POA: Diagnosis not present

## 2019-11-27 DIAGNOSIS — Z79899 Other long term (current) drug therapy: Secondary | ICD-10-CM | POA: Diagnosis not present

## 2019-11-27 LAB — COMPREHENSIVE METABOLIC PANEL
ALT: 20 U/L (ref 0–44)
AST: 24 U/L (ref 15–41)
Albumin: 3.9 g/dL (ref 3.5–5.0)
Alkaline Phosphatase: 27 U/L — ABNORMAL LOW (ref 38–126)
Anion gap: 10 (ref 5–15)
BUN: 18 mg/dL (ref 8–23)
CO2: 25 mmol/L (ref 22–32)
Calcium: 10.2 mg/dL (ref 8.9–10.3)
Chloride: 104 mmol/L (ref 98–111)
Creatinine, Ser: 1.94 mg/dL — ABNORMAL HIGH (ref 0.61–1.24)
GFR calc Af Amer: 39 mL/min — ABNORMAL LOW (ref >60)
GFR calc non Af Amer: 34 mL/min — ABNORMAL LOW (ref >60)
Glucose, Bld: 111 mg/dL — ABNORMAL HIGH (ref 70–99)
Potassium: 4.3 mmol/L (ref 3.5–5.1)
Sodium: 139 mmol/L (ref 135–145)
Total Bilirubin: 0.5 mg/dL (ref 0.3–1.2)
Total Protein: 6 g/dL — ABNORMAL LOW (ref 6.5–8.1)

## 2019-11-27 LAB — CBC WITH DIFFERENTIAL/PLATELET
Abs Immature Granulocytes: 0 10*3/uL (ref 0.00–0.07)
Basophils Absolute: 0 10*3/uL (ref 0.0–0.1)
Basophils Relative: 1 %
Eosinophils Absolute: 0.1 10*3/uL (ref 0.0–0.5)
Eosinophils Relative: 3 %
HCT: 30.8 % — ABNORMAL LOW (ref 39.0–52.0)
Hemoglobin: 10.3 g/dL — ABNORMAL LOW (ref 13.0–17.0)
Immature Granulocytes: 0 %
Lymphocytes Relative: 26 %
Lymphs Abs: 1.2 10*3/uL (ref 0.7–4.0)
MCH: 36.3 pg — ABNORMAL HIGH (ref 26.0–34.0)
MCHC: 33.4 g/dL (ref 30.0–36.0)
MCV: 108.5 fL — ABNORMAL HIGH (ref 80.0–100.0)
Monocytes Absolute: 0.6 10*3/uL (ref 0.1–1.0)
Monocytes Relative: 13 %
Neutro Abs: 2.7 10*3/uL (ref 1.7–7.7)
Neutrophils Relative %: 57 %
Platelets: 172 10*3/uL (ref 150–400)
RBC: 2.84 MIL/uL — ABNORMAL LOW (ref 4.22–5.81)
RDW: 13 % (ref 11.5–15.5)
WBC: 4.7 10*3/uL (ref 4.0–10.5)
nRBC: 0 % (ref 0.0–0.2)

## 2019-11-27 LAB — MAGNESIUM: Magnesium: 1.7 mg/dL (ref 1.7–2.4)

## 2019-11-27 MED ORDER — DENOSUMAB 120 MG/1.7ML ~~LOC~~ SOLN
120.0000 mg | Freq: Once | SUBCUTANEOUS | Status: AC
  Administered 2019-11-27: 120 mg via SUBCUTANEOUS
  Filled 2019-11-27: qty 1.7

## 2019-11-27 MED ORDER — SODIUM CHLORIDE 0.9 % IV SOLN
INTRAVENOUS | Status: DC
  Administered 2019-11-27: 13:00:00 via INTRAVENOUS

## 2019-11-27 MED ORDER — SODIUM CHLORIDE 0.9% FLUSH
10.0000 mL | Freq: Once | INTRAVENOUS | Status: AC
  Administered 2019-11-27: 10 mL via INTRAVENOUS

## 2019-11-27 MED ORDER — HEPARIN SOD (PORK) LOCK FLUSH 100 UNIT/ML IV SOLN
500.0000 [IU] | Freq: Once | INTRAVENOUS | Status: AC
  Administered 2019-11-27: 500 [IU] via INTRAVENOUS

## 2019-11-27 NOTE — Progress Notes (Signed)
Labs reviewed with MD today. Will hold treatment today, give 500 ml fluid bolus per MD. Will do labs again tomorrow and possible treatment.   Treatment given per orders. Patient tolerated it well without problems. Vitals stable and discharged home from clinic ambulatory. Follow up as scheduled.

## 2019-11-28 ENCOUNTER — Ambulatory Visit (HOSPITAL_COMMUNITY): Payer: Medicare Other

## 2019-11-28 ENCOUNTER — Inpatient Hospital Stay (HOSPITAL_COMMUNITY): Payer: Medicare Other

## 2019-11-28 ENCOUNTER — Encounter (HOSPITAL_COMMUNITY): Payer: Self-pay | Admitting: Hematology

## 2019-11-28 ENCOUNTER — Inpatient Hospital Stay (HOSPITAL_COMMUNITY): Payer: Medicare Other | Attending: Hematology | Admitting: Hematology

## 2019-11-28 ENCOUNTER — Ambulatory Visit (HOSPITAL_COMMUNITY): Payer: Medicare Other | Admitting: Hematology

## 2019-11-28 VITALS — BP 125/65 | HR 64 | Temp 97.6°F | Resp 18

## 2019-11-28 VITALS — BP 132/72 | HR 66 | Temp 97.5°F | Resp 18 | Wt 218.8 lb

## 2019-11-28 DIAGNOSIS — Z5111 Encounter for antineoplastic chemotherapy: Secondary | ICD-10-CM | POA: Insufficient documentation

## 2019-11-28 DIAGNOSIS — Z9221 Personal history of antineoplastic chemotherapy: Secondary | ICD-10-CM | POA: Diagnosis not present

## 2019-11-28 DIAGNOSIS — C50921 Malignant neoplasm of unspecified site of right male breast: Secondary | ICD-10-CM | POA: Insufficient documentation

## 2019-11-28 DIAGNOSIS — R944 Abnormal results of kidney function studies: Secondary | ICD-10-CM | POA: Diagnosis not present

## 2019-11-28 DIAGNOSIS — N183 Chronic kidney disease, stage 3 unspecified: Secondary | ICD-10-CM | POA: Insufficient documentation

## 2019-11-28 DIAGNOSIS — G620 Drug-induced polyneuropathy: Secondary | ICD-10-CM | POA: Diagnosis not present

## 2019-11-28 DIAGNOSIS — Z79810 Long term (current) use of selective estrogen receptor modulators (SERMs): Secondary | ICD-10-CM | POA: Diagnosis not present

## 2019-11-28 DIAGNOSIS — C9000 Multiple myeloma not having achieved remission: Secondary | ICD-10-CM | POA: Insufficient documentation

## 2019-11-28 DIAGNOSIS — D631 Anemia in chronic kidney disease: Secondary | ICD-10-CM | POA: Insufficient documentation

## 2019-11-28 LAB — CBC WITH DIFFERENTIAL/PLATELET
Abs Immature Granulocytes: 0 10*3/uL (ref 0.00–0.07)
Basophils Absolute: 0 10*3/uL (ref 0.0–0.1)
Basophils Relative: 1 %
Eosinophils Absolute: 0.2 10*3/uL (ref 0.0–0.5)
Eosinophils Relative: 4 %
HCT: 31.1 % — ABNORMAL LOW (ref 39.0–52.0)
Hemoglobin: 10.3 g/dL — ABNORMAL LOW (ref 13.0–17.0)
Immature Granulocytes: 0 %
Lymphocytes Relative: 25 %
Lymphs Abs: 1 10*3/uL (ref 0.7–4.0)
MCH: 36.3 pg — ABNORMAL HIGH (ref 26.0–34.0)
MCHC: 33.1 g/dL (ref 30.0–36.0)
MCV: 109.5 fL — ABNORMAL HIGH (ref 80.0–100.0)
Monocytes Absolute: 0.5 10*3/uL (ref 0.1–1.0)
Monocytes Relative: 13 %
Neutro Abs: 2.2 10*3/uL (ref 1.7–7.7)
Neutrophils Relative %: 57 %
Platelets: 167 10*3/uL (ref 150–400)
RBC: 2.84 MIL/uL — ABNORMAL LOW (ref 4.22–5.81)
RDW: 12.9 % (ref 11.5–15.5)
WBC: 3.9 10*3/uL — ABNORMAL LOW (ref 4.0–10.5)
nRBC: 0 % (ref 0.0–0.2)

## 2019-11-28 LAB — COMPREHENSIVE METABOLIC PANEL
ALT: 19 U/L (ref 0–44)
AST: 21 U/L (ref 15–41)
Albumin: 3.8 g/dL (ref 3.5–5.0)
Alkaline Phosphatase: 24 U/L — ABNORMAL LOW (ref 38–126)
Anion gap: 7 (ref 5–15)
BUN: 15 mg/dL (ref 8–23)
CO2: 25 mmol/L (ref 22–32)
Calcium: 9.6 mg/dL (ref 8.9–10.3)
Chloride: 108 mmol/L (ref 98–111)
Creatinine, Ser: 1.87 mg/dL — ABNORMAL HIGH (ref 0.61–1.24)
GFR calc Af Amer: 41 mL/min — ABNORMAL LOW (ref >60)
GFR calc non Af Amer: 35 mL/min — ABNORMAL LOW (ref >60)
Glucose, Bld: 105 mg/dL — ABNORMAL HIGH (ref 70–99)
Potassium: 4.1 mmol/L (ref 3.5–5.1)
Sodium: 140 mmol/L (ref 135–145)
Total Bilirubin: 0.5 mg/dL (ref 0.3–1.2)
Total Protein: 6 g/dL — ABNORMAL LOW (ref 6.5–8.1)

## 2019-11-28 MED ORDER — DEXTROSE 5 % IV SOLN
34.0000 mg/m2 | Freq: Once | INTRAVENOUS | Status: AC
  Administered 2019-11-28: 70 mg via INTRAVENOUS
  Filled 2019-11-28: qty 30

## 2019-11-28 MED ORDER — SODIUM CHLORIDE 0.9 % IV SOLN
Freq: Once | INTRAVENOUS | Status: AC
  Administered 2019-11-28: 10:00:00 via INTRAVENOUS

## 2019-11-28 MED ORDER — SODIUM CHLORIDE 0.9% FLUSH: 10.0000 mL | INTRAVENOUS | Status: DC | PRN

## 2019-11-28 MED ORDER — SODIUM CHLORIDE 0.9 % IV SOLN
Freq: Once | INTRAVENOUS | Status: AC
  Administered 2019-11-28: 12:00:00 via INTRAVENOUS

## 2019-11-28 MED ORDER — ACETAMINOPHEN 325 MG PO TABS
650.0000 mg | ORAL_TABLET | Freq: Once | ORAL | Status: AC
  Administered 2019-11-28: 650 mg via ORAL

## 2019-11-28 MED ORDER — SODIUM CHLORIDE 0.9 % IV SOLN
20.0000 mg | Freq: Once | INTRAVENOUS | Status: AC
  Administered 2019-11-28: 20 mg via INTRAVENOUS
  Filled 2019-11-28: qty 20

## 2019-11-28 MED ORDER — HEPARIN SOD (PORK) LOCK FLUSH 100 UNIT/ML IV SOLN
500.0000 [IU] | Freq: Once | INTRAVENOUS | Status: AC | PRN
  Administered 2019-11-28: 500 [IU]

## 2019-11-28 NOTE — Progress Notes (Signed)
[facility], [place] [address]   CLINIC:  Medical Oncology/Hematology  PCP:  Johnny Percy, MD [place] [address] [phone]   REASON FOR VISIT:  Follow-up for Multiple Myeloma  CURRENT THERAPY: Carfilzomib   BRIEF ONCOLOGIC HISTORY:  Oncology History  Breast [facility], male ([facility])  12/31/2009 Initial Biopsy   Biopsy of L breast    12/31/2009 Pathology Results   Invasive ductal carcinoma, ER/PR+, HER 2 negative   12/31/2009 Imaging   Ultrasound showing a 2.43 x 1.85 x 3 cm hypoechoic spiculated mass in the 12 o clock L breast retroareolar region   01/01/2010 -  Anti-estrogen oral therapy   Tamoxifen 20 mg daily   01/06/2010 Imaging   Bone scan abnormal uptake in the diaphysis of the R humerus, abnormal in the R third, fifth and sixth ribs, lesion also noted in the sternum.   02/03/2010 Surgery   Rod placement and fixation of R humerus by Dr. Amedeo [facility]   02/05/2010 - 02/18/2010 Radiation Therapy   30Gy in 10 fractions of 3 Gy per fraction to R pathologic fracture   03/11/2010 -  Chemotherapy   Denosumab monthly, now every 3 months. Started at [facility]    06/09/2016 Imaging   Three hypermetabolic osseous lesions in the sternum, left ilium and right ilium, as discussed above, likely represent osseous metastases. At this time, these are not recognizable on the CT images. 2. No extra skeletal metastatic disease identified in the neck, chest, abdomen or pelvis.   10/13/2016 Progression   PET shows various new and enlarging osseous metastatic lesions with no definite extra osseous metastatic disease currently identified.    12/31/2016 Progression   1. Multifocal hypermetabolic osseous metastases throughout the axial and proximal appendicular skeleton, which are increased in size, number and metabolism since 10/13/2016 PET-CT. 2. 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 3. No additional sites of hypermetabolic metastatic disease. 4. Chronic right mastoid sinusitis. 5. Aortic atherosclerosis.  One vessel coronary atherosclerosis.   Multiple myeloma ([facility])  02/12/2017 Bone Marrow Biopsy   The marrow was variably cellular with large peritrabecular aggregates of kappa restricted plasma cells (66% by aspirate, 30% by Cd138). Cytogenetics +11.    03/01/2017 - 06/29/2017 Chemotherapy   RVD    05/26/2017 Bone Marrow Biopsy   Performed at [facility]:  Plasma cell myeloma in a 30% cellularmarrow with decreased trilineage hematopoiesis and 42% kappalight chain restricted plasma cells on the aspirate smears andlarge aggregates on the core biopsy.    07/12/2017 - 09/01/2017 Chemotherapy   3 cycles of carfizolmib/cyclophosphamide/dexamethasone     10/08/2017 Bone Marrow Transplant   Autotransplant at [facility]      [facility] STAGING: [facility] Staging Multiple myeloma [facility]) Staging form: Plasma Cell Myeloma and Plasma Cell Disorders, AJCC 8th Edition - Clinical stage from 05/03/2017: Beta-2-microglobulin (mg/L): 3.5, Albumin (g/dL): 3.4, ISS: Stage II, High-risk cytogenetics: Absent, LDH: Not assessed - Signed by [facility], PA-C on 05/03/2017    INTERVAL HISTORY:  [name] 73 y.o. male seen for follow-up of myeloma and breast Lucas.  Appetite is 75%.  Energy levels are 100%.  He reportedly took antibiotics for 2 weeks for dental infection.  Numbness in the hands and feet has been stable.  No new onset pains reported.  Erythematous rash on the dorsum of the right foot is stable.  He was recommended to see a dermatology consult.  Denies any fevers, night sweats or weight loss.  Denies any nausea, vomiting, diarrhea or constipation.  REVIEW OF SYSTEMS:  Review of Systems  Skin: Positive for rash.  Neurological: Positive for numbness.  All other systems reviewed and are negative.     PAST MEDICAL/SURGICAL HISTORY:  Past Medical History:  Diagnosis Date  . Anxiety   . Bone metastases ([place]) 09/10/2016  . Breast Lucas ([place]) 2011   Stave IV breast Lucas; radiation and tamoxifen  . Breast Lucas, male ([place])    Stave IV breast Lucas; radiation and tamoxifen  Overview:  Left breast ca with mets bone  Overview:  METS TO BONE  . GERD (gastroesophageal reflux disease)   . Hypertension   . Macular degeneration   . Multiple myeloma ([place]) 02/18/2017  . Peripheral neuropathy    Past Surgical History:  Procedure Laterality Date  . BACK SURGERY    . CATARACT EXTRACTION W/PHACO Left 02/03/2019   Procedure: CATARACT EXTRACTION PHACO AND INTRAOCULAR LENS PLACEMENT ([place]);  Surgeon: Baruch Goldmann, MD;  Location: [facility];  Service: Ophthalmology;  Laterality: Left;  CDE: 15.89  . HERNIA REPAIR    . PORTACATH PLACEMENT Left 06/10/2018   Procedure: INSERTION PORT-A-CATH;  Surgeon: Virl Cagey, MD;  Location: [facility];  Service: General;  Laterality: Left;  . right arm surgery       SOCIAL HISTORY:  Social History   Socioeconomic History  . Marital status: Married    Spouse name: Not on file  . Number of children: 3  . Years of education: Not on file  . Highest education level: Not on file  Occupational History  . Not on file  Social Needs  . Financial resource strain: Not on file  . Food insecurity    Worry: Not on file    Inability: Not on file  . Transportation needs    Medical: Not on file    Non-medical: Not on file  Tobacco Use  . Smoking status: Never Smoker  . Smokeless tobacco: Former Network engineer and Sexual Activity  . Alcohol use: Yes    Alcohol/week: 24.0 standard drinks    Types: 24 Cans of beer per week  . Drug use: No  . Sexual activity: Not on file  Lifestyle  . Physical activity    Days per week: Not on file    Minutes per session: Not on file  . Stress: Not on file  Relationships  . Social Herbalist on phone: Not on file     Gets together: Not on file    Attends religious service: Not on file    Active member of club or organization: Not on file    Attends meetings of clubs or organizations: Not on file    Relationship status: Not on file  . Intimate partner violence    Fear of current or ex partner: Not on file    Emotionally abused: Not on file    Physically abused: Not on file    Forced sexual activity: Not on file  Other Topics Concern  . Not on file  Social History Narrative  . Not on file    FAMILY HISTORY:  Family History  Problem Relation Age of Onset  . Stroke Mother   . [facility] Maternal Aunt        [facility] NOS; died in her 39s  . Lung Lucas Maternal Uncle  smoker    CURRENT MEDICATIONS:  Outpatient Encounter Medications as of 11/28/2019  Medication Sig Note  . acyclovir (ZOVIRAX) 800 MG tablet TAKE 1 TABLET TWICE DAILY   . amLODipine (NORVASC) 5 MG tablet Take 1 tablet (5 mg total) by mouth daily.   Marland Kitchen aspirin EC 81 MG tablet Take 81 mg by mouth daily.   . calcium-vitamin D (OSCAL WITH D) 500-200 MG-UNIT TABS tablet Take 1 tablet by mouth 2 (two) times daily.   Marland Kitchen CARFILZOMIB IV Inject into the vein.   . Cholecalciferol (VITAMIN D) 50 MCG (2000 UT) CAPS Take 4,000 Units by mouth 2 (two) times daily.   Marland Kitchen denosumab (XGEVA) 120 MG/1.7ML SOLN injection Inject 120 mg into the skin once. 01/18/2019: [place]   . diclofenac Sodium (VOLTAREN) 1 % GEL Use 1 to 2 times per day as directed by doctor   . folic acid (FOLVITE) 1 MG tablet TAKE 1 TABLET BY MOUTH DAILY   . gabapentin (NEURONTIN) 300 MG capsule Take 1 capsule (300 mg total) by mouth 2 (two) times daily.   . hydrALAZINE (APRESOLINE) 50 MG tablet Take 1 tablet (50 mg total) by mouth every 8 (eight) hours. (Patient taking differently: Take 50 mg by mouth 2 (two) times daily. )   . loratadine (CLARITIN) 10 MG tablet Take 1 tablet (10 mg total) by mouth daily.   . metoprolol succinate (TOPROL-XL) 100 MG 24 hr tablet Take 100 mg by  mouth daily.    . Multiple Vitamin (MULTI-VITAMIN) tablet Take by mouth.   . Multiple Vitamin (MULTIVITAMIN WITH MINERALS) TABS tablet Take 1 tablet by mouth daily.   . pantoprazole (PROTONIX) 40 MG tablet Take 40 mg by mouth every other day.    . tamoxifen (NOLVADEX) 20 MG tablet Take 1 tablet (20 mg total) by mouth daily.   . vitamin B-12 (CYANOCOBALAMIN) 1000 MCG tablet Take 1,000 mcg by mouth daily.   Marland Kitchen ALPRAZolam (XANAX) 0.5 MG tablet TAKE 1 TABLET BY MOUTH AT BEDTIME AS NEEDED FOR ANXIETY OR SLEEP (Patient not taking: Reported on 11/28/2019)   . ondansetron (ZOFRAN) 8 MG tablet TAKE ONE TABLET BY MOUTH EVERY 8 HOURS AS NEEDED (Patient not taking: Reported on 11/28/2019)   . polyethylene glycol (MIRALAX / GLYCOLAX) packet Take 17 g by mouth daily as needed for mild constipation. (Patient not taking: Reported on 11/28/2019)   . prochlorperazine (COMPAZINE) 10 MG tablet Take 1 tablet (10 mg total) by mouth every 6 (six) hours as needed for nausea or vomiting. (Patient not taking: Reported on 11/28/2019)   . pseudoephedrine-guaifenesin (MUCINEX D) 60-600 MG 12 hr tablet Take 1 tablet by mouth 2 (two) times daily as needed for congestion.    . [DISCONTINUED] ALPRAZolam (XANAX) 0.5 MG tablet TAKE 1 TABLET BY MOUTH AT BEDTIME AS NEEDED FOR ANXIETY OR SLEEP    Facility-Administered Encounter Medications as of 11/28/2019  Medication  . heparin lock flush 100 unit/mL  . sodium chloride flush (NS) 0.9 % injection 10 mL  . sodium chloride flush (NS) 0.9 % injection 10 mL  . [DISCONTINUED] 0.9 %  sodium chloride infusion    ALLERGIES:  Allergies  Allergen Reactions  . Cefepime     Suspected severe thrombocytopenia is a result of cefepime induced antigen platelet destruction     PHYSICAL EXAM:  ECOG Performance status: 1  Vitals:   11/28/19 0833  BP: 132/72  Pulse: 66  Resp: 18  Temp: (!) 97.5 F (36.4 C)  SpO2: 99%  Filed Weights   11/28/19 0833  Weight: 218 lb 12.8 oz (99.2 kg)     Physical Exam Constitutional:      Appearance: Normal appearance.  HENT:     Head: Normocephalic.     Nose: Nose normal.     Mouth/Throat:     Mouth: Mucous membranes are moist.     Pharynx: Oropharynx is clear.  Eyes:     Extraocular Movements: Extraocular movements intact.     Conjunctiva/sclera: Conjunctivae normal.  Neck:     Musculoskeletal: Normal range of motion.  Cardiovascular:     Rate and Rhythm: Normal rate and regular rhythm.     Pulses: Normal pulses.     Heart sounds: Normal heart sounds.  Pulmonary:     Effort: Pulmonary effort is normal.     Breath sounds: Normal breath sounds.  Abdominal:     General: Bowel sounds are normal.     Palpations: Abdomen is soft.  Musculoskeletal: Normal range of motion.  Skin:    General: Skin is warm and dry.  Neurological:     General: No focal deficit present.     Mental Status: He is alert and oriented to person, place, and time. Mental status is at baseline.  Psychiatric:        Mood and Affect: Mood normal.        Behavior: Behavior normal.        Thought Content: Thought content normal.        Judgment: Judgment normal.      LABORATORY DATA:  I have reviewed the labs as listed.  CBC    Component Value Date/Time   WBC 3.9 (L) 11/28/2019 0826   RBC 2.84 (L) 11/28/2019 0826   HGB 10.3 (L) 11/28/2019 0826   HCT 31.1 (L) 11/28/2019 0826   PLT 167 11/28/2019 0826   MCV 109.5 (H) 11/28/2019 0826   MCH 36.3 (H) 11/28/2019 0826   MCHC 33.1 11/28/2019 0826   RDW 12.9 11/28/2019 0826   LYMPHSABS 1.0 11/28/2019 0826   MONOABS 0.5 11/28/2019 0826   EOSABS 0.2 11/28/2019 0826   BASOSABS 0.0 11/28/2019 0826   CMP Latest Ref Rng & Units 11/28/2019 11/27/2019 11/09/2019  Glucose 70 - 99 mg/dL 105(H) 111(H) 99  BUN 8 - 23 mg/dL '15 18 17  '$ Creatinine 0.61 - 1.24 mg/dL 1.87(H) 1.94(H) 1.70(H)  Sodium 135 - 145 mmol/L 140 139 141  Potassium 3.5 - 5.1 mmol/L 4.1 4.3 4.2  Chloride 98 - 111 mmol/L 108 104 105  CO2 22 - 32  mmol/L '25 25 26  '$ Calcium 8.9 - 10.3 mg/dL 9.6 10.2 9.4  Total Protein 6.5 - 8.1 g/dL 6.0(L) 6.0(L) 6.2(L)  Total Bilirubin 0.3 - 1.2 mg/dL 0.5 0.5 0.5  Alkaline Phos 38 - 126 U/L 24(L) 27(L) 29(L)  AST 15 - 41 U/L '21 24 16  '$ ALT 0 - 44 U/L '19 20 17    '$ Radiology: I have reviewed his previous scans.  ASSESSMENT & PLAN:   Multiple myeloma ([place]) 1.  Stage II IgG kappa multiple myeloma (Dx February 2018): -RVD from 03/01/2017 through 06/29/2017 -Chemotherapy changed to 3 cycles of CCyD from 07/12/2017-04/2017 to obtain deeper response - Status post auto stem cell transplant on 10/08/2017 -Stable M spike after transplant, started on CCyD, cycle 1 on 02/14/2018, cycle 2 on 03/14/2018 -His M spike after 2 cycles was undetectable, when measured at [facility] last week.  He was recommended to have maintenance with carfilzomib 70 mg/m square on days  1 and 15 every 28 days by Dr. Norma Fredrickson. -He received his first carfilzomib '70mg'$ /m2 on 04/20/2018, developed fever with chills 3 hours later with weakness in the extremities.  He was admitted to the [facility], received 1 unit of blood transfusion.  Blood cultures turned out to be negative.  He was febrile after hospitalization and was sent home. - Subsequently he tolerated carfilzomib and smaller doses very well.  We have increased his doses to 60 mg/m on 09/01/2018 and he tolerated 4 doses very well with the addition of dexamethasone. - Carfilzomib dose was increased to 70 mg per metered square on 10/27/2018.   -He was hospitalized on 11/29/2018 through 12/05/2018 with bronchitis, positive for parainfluenza, acute renal failure and severe thrombocytopenia.  His platelet count went down to 9.  We have given IVIG 1 g/kg x 2 doses.  Platelet count has slowly improved. - Review of literature showed carfilzomib can cause dose-related thrombotic microangiopathy. -Carfilzomib restarted at reduced dose of 35 mg per metered square on 01/05/2019. -Myeloma panel from 10/26/2019  shows M spike of 0.2 g.  This was 0.1 g previously.  Free light chain ratio was 7.71, up from 4.88 previously.  Kappa light chains have increased to 61.7, from 51.7. -He also had elevated creatinine to 1.94 yesterday.  We held his treatment and give him some fluids.  His creatinine today is 1.87. -Upon further questioning, he reports that he has been on antibiotic for his dental infection for 14 days.  He recollects the antibiotic as "cycling". -Worsening of creatinine could be related to his antibiotic use.  We have sent myeloma panel from today which is pending. -I will review in 2 weeks and see if we need to change his therapy if the myeloma panel continues to get worse.   2.  Metastatic left breast [facility] to the bones: -Weakness in 2011.  He is status post lumpectomy.  He will continue tamoxifen.  3.  Peripheral neuropathy: -His neuropathy has been stable.  He will continue gabapentin 300 mg twice daily.  4.  ID prophylaxis: -He will continue acyclovir 400 mg twice daily.  5.  Bone strengthening: -Continue denosumab.  Continue calcium supplements.  6.  Macrocytic anemia: -Etiology stage III CKD.  Last Feraheme on 04/27/2019. -He has not received Procrit since 06/06/2019 as his insurance is not covering it.  His hemoglobin today is 10.3.  We will continue to monitor his ferritin and iron panel.     Total time spent is 25 minutes with more than 50% of the time spent face-to-face discussing myeloma labs, further treatment plan, counseling and coordination of care.  Orders placed this encounter:  Orders Placed This Encounter  Procedures  . Kappa/lambda light chains  . Protein electrophoresis, serum  . CBC with Differential/Platelet  . Comprehensive metabolic panel  . CBC with Differential/Platelet  . Comprehensive metabolic panel  . Protein electrophoresis, serum  . Kappa/lambda light chains  . Lactate dehydrogenase  . Iron and TIBC  . Ferritin  . Vitamin B12  . Folate       Derek Jack, MD [place] [phone]

## 2019-11-28 NOTE — Progress Notes (Signed)
Patient presents today for treatment and follow up visit with Dr. Delton Coombes. Creat 1.87 today and MD aware. No new orders. Vital signs within parameters for treatment.   Treatment given today per MD orders. Tolerated infusion without adverse affects. Vital signs stable. No complaints at this time. Discharged from clinic ambulatory. F/U with [facility] as scheduled.

## 2019-11-28 NOTE — Patient Instructions (Addendum)
[facility] at [facility] Discharge Instructions  You were seen today by Dr. Katragadda. He went over your recent lab results. He will see you back in 2 weeks for labs and follow up.   Thank you for choosing [facility] at [facility] to provide your oncology and hematology care.  To afford each patient quality time with our provider, please arrive at least 15 minutes before your scheduled appointment time.   If you have a lab appointment with the Cancer Center please come in thru the  Main Entrance and check in at the main information desk  You need to re-schedule your appointment should you arrive 10 or more minutes late.  We strive to give you quality time with our providers, and arriving late affects you and other patients whose appointments are after yours.  Also, if you no show three or more times for appointments you may be dismissed from the clinic at the providers discretion.     Again, thank you for choosing [facility].  Our hope is that these requests will decrease the amount of time that you wait before being seen by our physicians.       _____________________________________________________________  Should you have questions after your visit to  Cancer Center, please contact our office at [phone] between the hours of 8:00 a.m. and 4:30 p.m.  Voicemails left after 4:00 p.m. will not be returned until the following business day.  For prescription refill requests, have your pharmacy contact our office and allow 72 hours.    Cancer Center Support Programs:   > Cancer Support Group  2nd Tuesday of the month 1pm-2pm, Journey Room    

## 2019-11-28 NOTE — Progress Notes (Signed)
11/28/19  Ok to proceed with today's labs - MD aware of scr 1.87  V.O.  Dr Beckey Downing, LPN/Khyron Garno Ronnald Ramp, PharmD

## 2019-11-28 NOTE — Patient Instructions (Signed)
[facility] Discharge Instructions for Patients Receiving Chemotherapy  Today you received the following chemotherapy agents   To help prevent nausea and vomiting after your treatment, we encourage you to take your nausea medication   If you develop nausea and vomiting that is not controlled by your nausea medication, call the clinic.   BELOW ARE SYMPTOMS THAT SHOULD BE REPORTED IMMEDIATELY:  *FEVER GREATER THAN 100.5 F  *CHILLS WITH OR WITHOUT FEVER  NAUSEA AND VOMITING THAT IS NOT CONTROLLED WITH YOUR NAUSEA MEDICATION  *UNUSUAL SHORTNESS OF BREATH  *UNUSUAL BRUISING OR BLEEDING  TENDERNESS IN MOUTH AND THROAT WITH OR WITHOUT PRESENCE OF ULCERS  *URINARY PROBLEMS  *BOWEL PROBLEMS  UNUSUAL RASH Items with * indicate a potential emergency and should be followed up as soon as possible.  Feel free to call the clinic should you have any questions or concerns. The clinic phone number is [phone].  Please show the CHEMO ALERT CARD at check-in to the Emergency Department and triage nurse.   

## 2019-11-28 NOTE — Assessment & Plan Note (Signed)
1.  Stage II IgG kappa multiple myeloma (Dx February 2018): -RVD from 03/01/2017 through 06/29/2017 -Chemotherapy changed to 3 cycles of CCyD from 07/12/2017-04/2017 to obtain deeper response - Status post auto stem cell transplant on 10/08/2017 -Stable M spike after transplant, started on CCyD, cycle 1 on 02/14/2018, cycle 2 on 03/14/2018 -His M spike after 2 cycles was undetectable, when measured at [facility] last week.  He was recommended to have maintenance with carfilzomib 70 mg/m square on days 1 and 15 every 28 days by Dr. Norma Fredrickson. -He received his first carfilzomib 19m/m2 on 04/20/2018, developed fever with chills 3 hours later with weakness in the extremities.  He was admitted to the hospital, received 1 unit of blood transfusion.  Blood cultures turned out to be negative.  He was febrile after hospitalization and was sent home. - Subsequently he tolerated carfilzomib and smaller doses very well.  We have increased his doses to 60 mg/m on 09/01/2018 and he tolerated 4 doses very well with the addition of dexamethasone. - Carfilzomib dose was increased to 70 mg per metered square on 10/27/2018.   -He was hospitalized on 11/29/2018 through 12/05/2018 with bronchitis, positive for parainfluenza, acute renal failure and severe thrombocytopenia.  His platelet count went down to 9.  We have given IVIG 1 g/kg x 2 doses.  Platelet count has slowly improved. - Review of literature showed carfilzomib can cause dose-related thrombotic microangiopathy. -Carfilzomib restarted at reduced dose of 35 mg per metered square on 01/05/2019. -Myeloma panel from 10/26/2019 shows M spike of 0.2 g.  This was 0.1 g previously.  Free light chain ratio was 7.71, up from 4.88 previously.  Kappa light chains have increased to 61.7, from 51.7. -He also had elevated creatinine to 1.94 yesterday.  We held his treatment and give him some fluids.  His creatinine today is 1.87. -Upon further questioning, he reports that he has been on  antibiotic for his dental infection for 14 days.  He recollects the antibiotic as "cycling". -Worsening of creatinine could be related to his antibiotic use.  We have sent myeloma panel from today which is pending. -I will review in 2 weeks and see if we need to change his therapy if the myeloma panel continues to get worse.   2.  Metastatic left breast cancer to the bones: -Weakness in 2011.  He is status post lumpectomy.  He will continue tamoxifen.  3.  Peripheral neuropathy: -His neuropathy has been stable.  He will continue gabapentin 300 mg twice daily.  4.  ID prophylaxis: -He will continue acyclovir 400 mg twice daily.  5.  Bone strengthening: -Continue denosumab.  Continue calcium supplements.  6.  Macrocytic anemia: -Etiology stage III CKD.  Last Feraheme on 04/27/2019. -He has not received Procrit since 06/06/2019 as his insurance is not covering it.  His hemoglobin today is 10.3.  We will continue to monitor his ferritin and iron panel.

## 2019-11-29 ENCOUNTER — Ambulatory Visit (HOSPITAL_COMMUNITY): Payer: Medicare Other

## 2019-11-29 LAB — PROTEIN ELECTROPHORESIS, SERUM
A/G Ratio: 1.8 — ABNORMAL HIGH (ref 0.7–1.7)
Albumin ELP: 3.6 g/dL (ref 2.9–4.4)
Alpha-1-Globulin: 0.2 g/dL (ref 0.0–0.4)
Alpha-2-Globulin: 0.6 g/dL (ref 0.4–1.0)
Beta Globulin: 0.8 g/dL (ref 0.7–1.3)
Gamma Globulin: 0.4 g/dL (ref 0.4–1.8)
Globulin, Total: 2 g/dL — ABNORMAL LOW (ref 2.2–3.9)
M-Spike, %: 0.2 g/dL — ABNORMAL HIGH
Total Protein ELP: 5.6 g/dL — ABNORMAL LOW (ref 6.0–8.5)

## 2019-11-29 LAB — KAPPA/LAMBDA LIGHT CHAINS
Kappa free light chain: 81.4 mg/L — ABNORMAL HIGH (ref 3.3–19.4)
Kappa, lambda light chain ratio: 24.67 — ABNORMAL HIGH (ref 0.26–1.65)
Lambda free light chains: 3.3 mg/L — ABNORMAL LOW (ref 5.7–26.3)

## 2019-12-11 ENCOUNTER — Other Ambulatory Visit: Payer: Self-pay

## 2019-12-12 ENCOUNTER — Other Ambulatory Visit: Payer: Self-pay

## 2019-12-12 ENCOUNTER — Inpatient Hospital Stay (HOSPITAL_COMMUNITY): Payer: Medicare Other

## 2019-12-12 ENCOUNTER — Inpatient Hospital Stay (HOSPITAL_BASED_OUTPATIENT_CLINIC_OR_DEPARTMENT_OTHER): Payer: Medicare Other | Admitting: Hematology

## 2019-12-12 ENCOUNTER — Encounter (HOSPITAL_COMMUNITY): Payer: Self-pay | Admitting: Hematology

## 2019-12-12 VITALS — BP 150/68 | HR 66 | Temp 96.9°F | Resp 18

## 2019-12-12 VITALS — BP 130/72 | HR 66 | Temp 97.8°F | Resp 20 | Wt 220.0 lb

## 2019-12-12 DIAGNOSIS — C9000 Multiple myeloma not having achieved remission: Secondary | ICD-10-CM

## 2019-12-12 DIAGNOSIS — Z5111 Encounter for antineoplastic chemotherapy: Secondary | ICD-10-CM | POA: Diagnosis not present

## 2019-12-12 LAB — CBC WITH DIFFERENTIAL/PLATELET
Abs Immature Granulocytes: 0.01 10*3/uL (ref 0.00–0.07)
Basophils Absolute: 0 10*3/uL (ref 0.0–0.1)
Basophils Relative: 1 %
Eosinophils Absolute: 0.2 10*3/uL (ref 0.0–0.5)
Eosinophils Relative: 3 %
HCT: 32 % — ABNORMAL LOW (ref 39.0–52.0)
Hemoglobin: 10.6 g/dL — ABNORMAL LOW (ref 13.0–17.0)
Immature Granulocytes: 0 %
Lymphocytes Relative: 21 %
Lymphs Abs: 1.2 10*3/uL (ref 0.7–4.0)
MCH: 36.4 pg — ABNORMAL HIGH (ref 26.0–34.0)
MCHC: 33.1 g/dL (ref 30.0–36.0)
MCV: 110 fL — ABNORMAL HIGH (ref 80.0–100.0)
Monocytes Absolute: 0.6 10*3/uL (ref 0.1–1.0)
Monocytes Relative: 10 %
Neutro Abs: 3.8 10*3/uL (ref 1.7–7.7)
Neutrophils Relative %: 65 %
Platelets: 202 10*3/uL (ref 150–400)
RBC: 2.91 MIL/uL — ABNORMAL LOW (ref 4.22–5.81)
RDW: 13 % (ref 11.5–15.5)
WBC: 5.9 10*3/uL (ref 4.0–10.5)
nRBC: 0 % (ref 0.0–0.2)

## 2019-12-12 LAB — COMPREHENSIVE METABOLIC PANEL
ALT: 20 U/L (ref 0–44)
AST: 21 U/L (ref 15–41)
Albumin: 3.7 g/dL (ref 3.5–5.0)
Alkaline Phosphatase: 25 U/L — ABNORMAL LOW (ref 38–126)
Anion gap: 7 (ref 5–15)
BUN: 21 mg/dL (ref 8–23)
CO2: 27 mmol/L (ref 22–32)
Calcium: 9.2 mg/dL (ref 8.9–10.3)
Chloride: 104 mmol/L (ref 98–111)
Creatinine, Ser: 1.72 mg/dL — ABNORMAL HIGH (ref 0.61–1.24)
GFR calc Af Amer: 45 mL/min — ABNORMAL LOW (ref >60)
GFR calc non Af Amer: 39 mL/min — ABNORMAL LOW (ref >60)
Glucose, Bld: 102 mg/dL — ABNORMAL HIGH (ref 70–99)
Potassium: 4.3 mmol/L (ref 3.5–5.1)
Sodium: 138 mmol/L (ref 135–145)
Total Bilirubin: 0.5 mg/dL (ref 0.3–1.2)
Total Protein: 6 g/dL — ABNORMAL LOW (ref 6.5–8.1)

## 2019-12-12 MED ORDER — SODIUM CHLORIDE 0.9 % IV SOLN: Freq: Once | INTRAVENOUS | Status: AC

## 2019-12-12 MED ORDER — SODIUM CHLORIDE 0.9 % IV SOLN: INTRAVENOUS | Status: DC

## 2019-12-12 MED ORDER — SODIUM CHLORIDE 0.9 % IV SOLN: Freq: Once | INTRAVENOUS | Status: DC

## 2019-12-12 MED ORDER — DEXTROSE 5 % IV SOLN
34.0000 mg/m2 | Freq: Once | INTRAVENOUS | Status: AC
  Administered 2019-12-12: 70 mg via INTRAVENOUS
  Filled 2019-12-12: qty 5

## 2019-12-12 MED ORDER — ACETAMINOPHEN 325 MG PO TABS
650.0000 mg | ORAL_TABLET | Freq: Once | ORAL | Status: AC
  Administered 2019-12-12: 650 mg via ORAL
  Filled 2019-12-12: qty 2

## 2019-12-12 MED ORDER — SODIUM CHLORIDE 0.9% FLUSH
10.0000 mL | INTRAVENOUS | Status: DC | PRN
  Administered 2019-12-12 (×2): 10 mL

## 2019-12-12 MED ORDER — HEPARIN SOD (PORK) LOCK FLUSH 100 UNIT/ML IV SOLN
500.0000 [IU] | Freq: Once | INTRAVENOUS | Status: AC | PRN
  Administered 2019-12-12: 500 [IU]

## 2019-12-12 MED ORDER — SODIUM CHLORIDE 0.9 % IV SOLN
20.0000 mg | Freq: Once | INTRAVENOUS | Status: AC
  Administered 2019-12-12: 20 mg via INTRAVENOUS
  Filled 2019-12-12: qty 20

## 2019-12-12 NOTE — Progress Notes (Signed)
Johnny Lucas, [place] [address]   CLINIC:  Medical Oncology/Hematology  PCP:  [name], MD [place] [id number] [phone]   REASON FOR VISIT:  Follow-up for Multiple Myeloma  CURRENT THERAPY: Carfilzomib   BRIEF ONCOLOGIC HISTORY:  Oncology History  Breast cancer, male ([place])  12/31/2009 Initial Biopsy   Biopsy of L breast    12/31/2009 Pathology Results   Invasive ductal carcinoma, ER/PR+, HER 2 negative   12/31/2009 Imaging   Ultrasound showing a 2.43 x 1.85 x 3 cm hypoechoic spiculated mass in the 12 o clock L breast retroareolar region   01/01/2010 -  Anti-estrogen oral therapy   Tamoxifen 20 mg daily   01/06/2010 Imaging   Bone scan abnormal uptake in the diaphysis of the R humerus, abnormal in the R third, fifth and sixth ribs, lesion also noted in the sternum.   02/03/2010 Surgery   Rod placement and fixation of R humerus by Dr. [name]   02/05/2010 - 02/18/2010 Radiation Therapy   30Gy in 10 fractions of 3 Gy per fraction to R pathologic fracture   03/11/2010 -  Chemotherapy   Denosumab monthly, now every 3 months. Started at [facility]    06/09/2016 Imaging   Three hypermetabolic osseous lesions in the sternum, left ilium and right ilium, as discussed above, likely represent osseous metastases. At this time, these are not recognizable on the CT images. 2. No extra skeletal metastatic disease identified in the neck, chest, abdomen or pelvis.   10/13/2016 Progression   PET shows various new and enlarging osseous metastatic lesions with no definite extra osseous metastatic disease currently identified.    12/31/2016 Progression   1. Multifocal hypermetabolic osseous metastases throughout the axial and proximal appendicular skeleton, which are increased in size, number and metabolism since 10/13/2016 PET-CT. 2. 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 3. No additional sites of hypermetabolic metastatic disease. 4. Chronic right mastoid sinusitis. 5. Aortic atherosclerosis.  One vessel coronary atherosclerosis.   Multiple myeloma ([place])  02/12/2017 Bone Marrow Biopsy   The marrow was variably cellular with large peritrabecular aggregates of kappa restricted plasma cells (66% by aspirate, 30% by Cd138). Cytogenetics +11.    03/01/2017 - 06/29/2017 Chemotherapy   RVD    05/26/2017 Bone Marrow Biopsy   Performed at [facility]:  Plasma cell myeloma in a 30% cellularmarrow with decreased trilineage hematopoiesis and 42% kappalight chain restricted plasma cells on the aspirate smears andlarge aggregates on the core biopsy.    07/12/2017 - 09/01/2017 Chemotherapy   3 cycles of carfizolmib/cyclophosphamide/dexamethasone     10/08/2017 Bone Marrow Transplant   [facility]otransplant at [facility]      CANCER STAGING: Cancer Staging Multiple myeloma [name]) Staging form: Plasma Cell Myeloma and Plasma Cell Disorders, AJCC 8th Edition - Clinical stage from 05/03/2017: Beta-2-microglobulin (mg/L): 3.5, Albumin (g/dL): 3.4, ISS: Stage II, High-risk cytogenetics: Absent, LDH: Not assessed - Signed by Baird Cancer, PA-C on 05/03/2017    INTERVAL HISTORY:  Johnny Lucas 73 y.o. male seen for multiple myeloma, breast cancer, anemia.  He is receiving carfilzomib every [redacted] weeks along with dexamethasone.  Appetite is 100%.  Energy levels are 75%.  Occasional lightheadedness is stable.  Numbness in the extremities is also stable.  Denies any fevers or infections.  No recent antibiotic use.  Denies any ER visits or  hospitalizations.  REVIEW OF SYSTEMS:  Review of Systems  Neurological: Positive for dizziness and numbness.  All other systems reviewed and are negative.    PAST MEDICAL/SURGICAL HISTORY:  Past Medical History:  Diagnosis Date  . Anxiety   . Bone metastases ([place])  09/10/2016  . Breast cancer ([place]) 2011   Stave IV breast cancer; radiation and tamoxifen  . Breast cancer, male ([place])    Stave IV breast cancer; radiation and tamoxifen  Overview:  Left breast ca with mets bone  Overview:  METS TO BONE  . GERD (gastroesophageal reflux disease)   . Hypertension   . Macular degeneration   . Multiple myeloma ([place]) 02/18/2017  . Peripheral neuropathy    Past Surgical History:  Procedure Laterality Date  . BACK SURGERY    . CATARACT EXTRACTION W/PHACO Left 02/03/2019   Procedure: CATARACT EXTRACTION PHACO AND INTRAOCULAR LENS PLACEMENT ([place]);  Surgeon: Baruch Goldmann, MD;  Location: [facility];  Service: Ophthalmology;  Laterality: Left;  CDE: 15.89  . HERNIA REPAIR    . PORTACATH PLACEMENT Left 06/10/2018   Procedure: INSERTION PORT-A-CATH;  Surgeon: Virl Cagey, MD;  Location: [facility];  Service: General;  Laterality: Left;  . right arm surgery       SOCIAL HISTORY:  Social History   Socioeconomic History  . Marital status: Married    Spouse name: Not on file  . Number of children: 3  . Years of education: Not on file  . Highest education level: Not on file  Occupational History  . Not on file  Tobacco Use  . Smoking status: Never Smoker  . Smokeless tobacco: Former Network engineer and Sexual Activity  . Alcohol use: Yes    Alcohol/week: 24.0 standard drinks    Types: 24 Cans of beer per week  . Drug use: No  . Sexual activity: Not on file  Other Topics Concern  . Not on file  Social History Narrative  . Not on file   Social Determinants of Health   Financial Resource Strain:   . Difficulty of Paying Living Expenses: Not on file  Food Insecurity:   . Worried About Charity fundraiser in the Last Year: Not on file  . Ran Out of Food in the Last Year: Not on file  Transportation Needs:   . Lack of Transportation (Medical): Not on file  . Lack of Transportation (Non-Medical): Not on file  Physical Activity:   . Days of Exercise per  Week: Not on file  . Minutes of Exercise per Session: Not on file  Stress:   . Feeling of Stress : Not on file  Social Connections:   . Frequency of Communication with Friends and Family: Not on file  . Frequency of Social Gatherings with Friends and Family: Not on file  . Attends Religious Services: Not on file  . Active Member of Clubs or Organizations: Not on file  . Attends Archivist Meetings: Not on file  . Marital Status: Not on file  Intimate Partner Violence:   . Fear of Current or Ex-Partner: Not on file  . Emotionally Abused: Not on file  . Physically Abused: Not on file  . Sexually Abused: Not on file    FAMILY HISTORY:  Family History  Problem Relation Age of Onset  . Stroke Mother   . Cancer Maternal Aunt        cancer NOS; died in her 30s  . Lung cancer Maternal Uncle  smoker    CURRENT MEDICATIONS:  Outpatient Encounter Medications as of 12/12/2019  Medication Sig Note  . acyclovir (ZOVIRAX) 800 MG tablet TAKE 1 TABLET TWICE DAILY   . ALPRAZolam (XANAX) 0.5 MG tablet TAKE 1 TABLET BY MOUTH AT BEDTIME AS NEEDED FOR ANXIETY OR SLEEP   . amLODipine (NORVASC) 5 MG tablet Take 1 tablet (5 mg total) by mouth daily.   Marland Kitchen aspirin EC 81 MG tablet Take 81 mg by mouth daily.   . calcium-vitamin D (OSCAL WITH D) 500-200 MG-UNIT TABS tablet Take 1 tablet by mouth 2 (two) times daily.   Marland Kitchen CARFILZOMIB IV Inject into the vein.   . Cholecalciferol (VITAMIN D) 50 MCG (2000 UT) CAPS Take 4,000 Units by mouth 2 (two) times daily.   Marland Kitchen denosumab (XGEVA) 120 MG/1.7ML SOLN injection Inject 120 mg into the skin once. 01/18/2019: [place]   . diclofenac Sodium (VOLTAREN) 1 % GEL Use 1 to 2 times per day as directed by doctor   . folic acid (FOLVITE) 1 MG tablet TAKE 1 TABLET BY MOUTH DAILY   . gabapentin (NEURONTIN) 300 MG capsule Take 1 capsule (300 mg total) by mouth 2 (two) times daily.   . hydrALAZINE (APRESOLINE) 50 MG tablet Take 1 tablet (50 mg total) by  mouth every 8 (eight) hours. (Patient taking differently: Take 50 mg by mouth 2 (two) times daily. )   . metoprolol succinate (TOPROL-XL) 100 MG 24 hr tablet Take 100 mg by mouth daily.    . Multiple Vitamin (MULTIVITAMIN WITH MINERALS) TABS tablet Take 1 tablet by mouth daily.   . pantoprazole (PROTONIX) 40 MG tablet Take 40 mg by mouth every other day.    . tamoxifen (NOLVADEX) 20 MG tablet Take 1 tablet (20 mg total) by mouth daily.   . vitamin B-12 (CYANOCOBALAMIN) 1000 MCG tablet Take 1,000 mcg by mouth daily.   . [DISCONTINUED] Multiple Vitamin (MULTI-VITAMIN) tablet Take by mouth.   . loratadine (CLARITIN) 10 MG tablet Take 1 tablet (10 mg total) by mouth daily. (Patient not taking: Reported on 12/12/2019)   . ondansetron (ZOFRAN) 8 MG tablet TAKE ONE TABLET BY MOUTH EVERY 8 HOURS AS NEEDED (Patient not taking: Reported on 11/28/2019)   . polyethylene glycol (MIRALAX / GLYCOLAX) packet Take 17 g by mouth daily as needed for mild constipation. (Patient not taking: Reported on 11/28/2019)   . prochlorperazine (COMPAZINE) 10 MG tablet Take 1 tablet (10 mg total) by mouth every 6 (six) hours as needed for nausea or vomiting. (Patient not taking: Reported on 11/28/2019)   . pseudoephedrine-guaifenesin (MUCINEX D) 60-600 MG 12 hr tablet Take 1 tablet by mouth 2 (two) times daily as needed for congestion.    . [DISCONTINUED] ALPRAZolam (XANAX) 0.5 MG tablet TAKE 1 TABLET BY MOUTH AT BEDTIME AS NEEDED FOR ANXIETY OR SLEEP    Facility-Administered Encounter Medications as of 12/12/2019  Medication  . heparin lock flush 100 unit/mL  . sodium chloride flush (NS) 0.9 % injection 10 mL  . sodium chloride flush (NS) 0.9 % injection 10 mL    ALLERGIES:  Allergies  Allergen Reactions  . Cefepime     Suspected severe thrombocytopenia is a result of cefepime induced antigen platelet destruction     PHYSICAL EXAM:  ECOG Performance status: 1  Vitals:   12/12/19 1152  BP: 130/72  Pulse: 66  Resp:  20  Temp: 97.8 F (36.6 C)  SpO2: 97%   Filed Weights   12/12/19 1152  Weight:  220 lb (99.8 kg)    Physical Exam Constitutional:      Appearance: Normal appearance.  HENT:     Head: Normocephalic.     Nose: Nose normal.     Mouth/Throat:     Mouth: Mucous membranes are moist.     Pharynx: Oropharynx is clear.  Eyes:     Extraocular Movements: Extraocular movements intact.     Conjunctiva/sclera: Conjunctivae normal.  Cardiovascular:     Rate and Rhythm: Normal rate and regular rhythm.     Pulses: Normal pulses.     Heart sounds: Normal heart sounds.  Pulmonary:     Effort: Pulmonary effort is normal.     Breath sounds: Normal breath sounds.  Abdominal:     General: Bowel sounds are normal.     Palpations: Abdomen is soft.  Musculoskeletal:        General: Normal range of motion.     Cervical back: Normal range of motion.  Skin:    General: Skin is warm and dry.  Neurological:     General: No focal deficit present.     Mental Status: He is alert and oriented to person, place, and time. Mental status is at baseline.  Psychiatric:        Mood and Affect: Mood normal.        Behavior: Behavior normal.        Thought Content: Thought content normal.        Judgment: Judgment normal.      LABORATORY DATA:  I have reviewed the labs as listed.  CBC    Component Value Date/Time   WBC 5.9 12/12/2019 1302   RBC 2.91 (L) 12/12/2019 1302   HGB 10.6 (L) 12/12/2019 1302   HCT 32.0 (L) 12/12/2019 1302   PLT 202 12/12/2019 1302   MCV 110.0 (H) 12/12/2019 1302   MCH 36.4 (H) 12/12/2019 1302   MCHC 33.1 12/12/2019 1302   RDW 13.0 12/12/2019 1302   LYMPHSABS 1.2 12/12/2019 1302   MONOABS 0.6 12/12/2019 1302   EOSABS 0.2 12/12/2019 1302   BASOSABS 0.0 12/12/2019 1302   CMP Latest Ref Rng & Units 12/12/2019 11/28/2019 11/27/2019  Glucose 70 - 99 mg/dL 102(H) 105(H) 111(H)  BUN 8 - 23 mg/dL 21 15 18   Creatinine 0.61 - 1.24 mg/dL 1.72(H) 1.87(H) 1.94(H)  Sodium 135 -  145 mmol/L 138 140 139  Potassium 3.5 - 5.1 mmol/L 4.3 4.1 4.3  Chloride 98 - 111 mmol/L 104 108 104  CO2 22 - 32 mmol/L 27 25 25   Calcium 8.9 - 10.3 mg/dL 9.2 9.6 10.2  Total Protein 6.5 - 8.1 g/dL 6.0(L) 6.0(L) 6.0(L)  Total Bilirubin 0.3 - 1.2 mg/dL 0.5 0.5 0.5  Alkaline Phos 38 - 126 U/L 25(L) 24(L) 27(L)  AST 15 - 41 U/L 21 21 24   ALT 0 - 44 U/L 20 19 20     Radiology: I have reviewed his previous scans.  ASSESSMENT & PLAN:   Multiple myeloma ([place]) 1.  Stage II IgG kappa multiple myeloma (Dx February 2018): -RVD from 03/01/2017 through 06/29/2017 -Chemotherapy changed to 3 cycles of CCyD from 07/12/2017-04/2017 to obtain deeper response - Status post auto stem cell transplant on 10/08/2017 -Stable M spike after transplant, started on CCyD, cycle 1 on 02/14/2018, cycle 2 on 03/14/2018 -His M spike after 2 cycles was undetectable, when measured at [facility] last week.  He was recommended to have maintenance with carfilzomib 70 mg/m square on days 1 and 15 every 28  days by Dr. Norma Fredrickson. -He received his first carfilzomib 108m/m2 on 04/20/2018, developed fever with chills 3 hours later with weakness in the extremities.  He was admitted to the hospital, received 1 unit of blood transfusion.  Blood cultures turned out to be negative.  He was febrile after hospitalization and was sent home. - Subsequently he tolerated carfilzomib and smaller doses very well.  We have increased his doses to 60 mg/m on 09/01/2018 and he tolerated 4 doses very well with the addition of dexamethasone. - Carfilzomib dose was increased to 70 mg per metered square on 10/27/2018.   -He was hospitalized on 11/29/2018 through 12/05/2018 with bronchitis, positive for parainfluenza, acute renal failure and severe thrombocytopenia.  His platelet count went down to 9.  We have given IVIG 1 g/kg x 2 doses.  Platelet count has slowly improved. - Review of literature showed carfilzomib can cause dose-related thrombotic  microangiopathy. -Carfilzomib restarted at reduced dose of 35 mg per metered square on 01/05/2019. -Myeloma panel on 11/28/2019 reviewed by me showed M spike of 0.2 g which is stable compared to October.  Kappa light chains increased to 81.4, previously 61.  Ratio increased to 24.67, previously 7.71. -He had elevation of creatinine recently which could have contributed to elevated light chains. -His creatinine today is better at 1.72.  He will proceed with carfilzomib today. -I plan to repeat myeloma panel in 2 weeks.  I will see him back in 4 weeks.  If there is any worsening, will consider change in therapy.   2.  Metastatic left breast cancer to the bones: -Diagnosed in 2011.  Status post lumpectomy.  He will continue tamoxifen.  3.  Peripheral neuropathy: -Continue gabapentin 300 mg twice daily.  4.  ID prophylaxis: -He will continue acyclovir 400 mg twice daily.  5.  Bone strengthening: -Continue denosumab.  He will continue calcium and vitamin D supplements.  6.  Macrocytic anemia: -Etiology stage III CKD.  Last Feraheme on 04/27/2019. -He has not received Procrit since 06/06/2019 as his insurance is not covering it.  Hemoglobin today is 10.6.    Total time spent is 25 minutes with more than 50% of the time spent face-to-face discussing myeloma labs, further treatment plan, counseling and coordination of care.  Orders placed this encounter:  Orders Placed This Encounter  Procedures  . CBC with Differential/Platelet  . Comprehensive metabolic panel  . Protein electrophoresis, serum  . Kappa/lambda light chains  . Lactate dehydrogenase      SDerek Jack MD [phone]

## 2019-12-12 NOTE — Patient Instructions (Addendum)
[place] at [facility] Discharge Instructions  You were seen today by Dr. Delton Coombes. He went over your recent lab results. Continue treatment every 2 weeks with labs. He will see you back in 4 weeks for labs, treatment and follow up.   Thank you for choosing [place] at [facility] to provide your oncology and hematology care.  To afford each patient quality time with our provider, please arrive at least 15 minutes before your scheduled appointment time.   If you have a lab appointment with the [facility] please come in thru the  Main Entrance and check in at the main information desk  You need to re-schedule your appointment should you arrive 10 or more minutes late.  We strive to give you quality time with our providers, and arriving late affects you and other patients whose appointments are after yours.  Also, if you no show three or more times for appointments you may be dismissed from the clinic at the providers discretion.     Again, thank you for choosing [facility].  Our hope is that these requests will decrease the amount of time that you wait before being seen by our physicians.       _____________________________________________________________  Should you have questions after your visit to [facility], please contact our office at [phone] between the hours of 8:00 a.m. and 4:30 p.m.  Voicemails left after 4:00 p.m. will not be returned until the following business day.  For prescription refill requests, have your pharmacy contact our office and allow 72 hours.    Cancer Center Support Programs:   > Cancer Support Group  2nd Tuesday of the month 1pm-2pm, Journey Room

## 2019-12-12 NOTE — Progress Notes (Signed)
Treatment given today per MD orders. Tolerated infusion without adverse affects. Vital signs stable. No complaints at this time. Discharged from clinic ambulatory. F/U with [facility] as scheduled.   

## 2019-12-12 NOTE — Progress Notes (Signed)
12/12/19  Ok to proceed with Kyprolis with scr 1.72 today.  T.O. Dr Beckey Downing, LPN/[place] [place] Ronnald Ramp PharmD

## 2019-12-12 NOTE — Patient Instructions (Signed)
[facility] Discharge Instructions for Patients Receiving Chemotherapy  Today you received the following chemotherapy agents   To help prevent nausea and vomiting after your treatment, we encourage you to take your nausea medication   If you develop nausea and vomiting that is not controlled by your nausea medication, call the clinic.   BELOW ARE SYMPTOMS THAT SHOULD BE REPORTED IMMEDIATELY:  *FEVER GREATER THAN 100.5 F  *CHILLS WITH OR WITHOUT FEVER  NAUSEA AND VOMITING THAT IS NOT CONTROLLED WITH YOUR NAUSEA MEDICATION  *UNUSUAL SHORTNESS OF BREATH  *UNUSUAL BRUISING OR BLEEDING  TENDERNESS IN MOUTH AND THROAT WITH OR WITHOUT PRESENCE OF ULCERS  *URINARY PROBLEMS  *BOWEL PROBLEMS  UNUSUAL RASH Items with * indicate a potential emergency and should be followed up as soon as possible.  Feel free to call the clinic should you have any questions or concerns. The clinic phone number is [phone].  Please show the CHEMO ALERT CARD at check-in to the Emergency Department and triage nurse.   

## 2019-12-14 ENCOUNTER — Encounter (HOSPITAL_COMMUNITY): Payer: Self-pay | Admitting: Hematology

## 2019-12-14 NOTE — Assessment & Plan Note (Signed)
1.  Stage II IgG kappa multiple myeloma (Dx February 2018): -RVD from 03/01/2017 through 06/29/2017 -Chemotherapy changed to 3 cycles of CCyD from 07/12/2017-04/2017 to obtain deeper response - Status post auto stem cell transplant on 10/08/2017 -Stable M spike after transplant, started on CCyD, cycle 1 on 02/14/2018, cycle 2 on 03/14/2018 -His M spike after 2 cycles was undetectable, when measured at [facility] last week.  He was recommended to have maintenance with carfilzomib 70 mg/m square on days 1 and 15 every 28 days by Dr. Norma Fredrickson. -He received his first carfilzomib 56m/m2 on 04/20/2018, developed fever with chills 3 hours later with weakness in the extremities.  He was admitted to the hospital, received 1 unit of blood transfusion.  Blood cultures turned out to be negative.  He was febrile after hospitalization and was sent home. - Subsequently he tolerated carfilzomib and smaller doses very well.  We have increased his doses to 60 mg/m on 09/01/2018 and he tolerated 4 doses very well with the addition of dexamethasone. - Carfilzomib dose was increased to 70 mg per metered square on 10/27/2018.   -He was hospitalized on 11/29/2018 through 12/05/2018 with bronchitis, positive for parainfluenza, acute renal failure and severe thrombocytopenia.  His platelet count went down to 9.  We have given IVIG 1 g/kg x 2 doses.  Platelet count has slowly improved. - Review of literature showed carfilzomib can cause dose-related thrombotic microangiopathy. -Carfilzomib restarted at reduced dose of 35 mg per metered square on 01/05/2019. -Myeloma panel on 11/28/2019 reviewed by me showed M spike of 0.2 g which is stable compared to October.  Kappa light chains increased to 81.4, previously 61.  Ratio increased to 24.67, previously 7.71. -He had elevation of creatinine recently which could have contributed to elevated light chains. -His creatinine today is better at 1.72.  He will proceed with carfilzomib today. -I plan  to repeat myeloma panel in 2 weeks.  I will see him back in 4 weeks.  If there is any worsening, will consider change in therapy.   2.  Metastatic left breast cancer to the bones: -Diagnosed in 2011.  Status post lumpectomy.  He will continue tamoxifen.  3.  Peripheral neuropathy: -Continue gabapentin 300 mg twice daily.  4.  ID prophylaxis: -He will continue acyclovir 400 mg twice daily.  5.  Bone strengthening: -Continue denosumab.  He will continue calcium and vitamin D supplements.  6.  Macrocytic anemia: -Etiology stage III CKD.  Last Feraheme on 04/27/2019. -He has not received Procrit since 06/06/2019 as his insurance is not covering it.  Hemoglobin today is 10.6.

## 2019-12-18 ENCOUNTER — Other Ambulatory Visit (HOSPITAL_COMMUNITY): Payer: Self-pay | Admitting: Hematology

## 2019-12-18 DIAGNOSIS — C50922 Malignant neoplasm of unspecified site of left male breast: Secondary | ICD-10-CM

## 2019-12-18 DIAGNOSIS — Z170 Estrogen receptor positive status [ER+]: Secondary | ICD-10-CM

## 2019-12-25 ENCOUNTER — Other Ambulatory Visit: Payer: Self-pay

## 2019-12-25 ENCOUNTER — Inpatient Hospital Stay (HOSPITAL_COMMUNITY): Payer: Medicare Other

## 2019-12-25 ENCOUNTER — Encounter (HOSPITAL_COMMUNITY): Payer: Self-pay

## 2019-12-25 VITALS — HR 73 | Temp 97.1°F | Resp 18

## 2019-12-25 DIAGNOSIS — C7951 Secondary malignant neoplasm of bone: Secondary | ICD-10-CM

## 2019-12-25 DIAGNOSIS — C9000 Multiple myeloma not having achieved remission: Secondary | ICD-10-CM

## 2019-12-25 DIAGNOSIS — Z5111 Encounter for antineoplastic chemotherapy: Secondary | ICD-10-CM | POA: Diagnosis not present

## 2019-12-25 LAB — COMPREHENSIVE METABOLIC PANEL
ALT: 20 U/L (ref 0–44)
AST: 17 U/L (ref 15–41)
Albumin: 3.9 g/dL (ref 3.5–5.0)
Alkaline Phosphatase: 23 U/L — ABNORMAL LOW (ref 38–126)
Anion gap: 12 (ref 5–15)
BUN: 18 mg/dL (ref 8–23)
CO2: 24 mmol/L (ref 22–32)
Calcium: 9.2 mg/dL (ref 8.9–10.3)
Chloride: 104 mmol/L (ref 98–111)
Creatinine, Ser: 1.8 mg/dL — ABNORMAL HIGH (ref 0.61–1.24)
GFR calc Af Amer: 43 mL/min — ABNORMAL LOW (ref >60)
GFR calc non Af Amer: 37 mL/min — ABNORMAL LOW (ref >60)
Glucose, Bld: 115 mg/dL — ABNORMAL HIGH (ref 70–99)
Potassium: 3.6 mmol/L (ref 3.5–5.1)
Sodium: 140 mmol/L (ref 135–145)
Total Bilirubin: 0.6 mg/dL (ref 0.3–1.2)
Total Protein: 6.3 g/dL — ABNORMAL LOW (ref 6.5–8.1)

## 2019-12-25 LAB — IRON AND TIBC
Iron: 74 ug/dL (ref 45–182)
Saturation Ratios: 21 % (ref 17.9–39.5)
TIBC: 345 ug/dL (ref 250–450)
UIBC: 271 ug/dL

## 2019-12-25 LAB — FERRITIN: Ferritin: 503 ng/mL — ABNORMAL HIGH (ref 24–336)

## 2019-12-25 LAB — LACTATE DEHYDROGENASE: LDH: 137 U/L (ref 98–192)

## 2019-12-25 LAB — VITAMIN B12: Vitamin B-12: 1052 pg/mL — ABNORMAL HIGH (ref 180–914)

## 2019-12-25 LAB — FOLATE: Folate: 70.1 ng/mL (ref >5.9)

## 2019-12-25 MED ORDER — DENOSUMAB 120 MG/1.7ML ~~LOC~~ SOLN
120.0000 mg | Freq: Once | SUBCUTANEOUS | Status: AC
  Administered 2019-12-25: 120 mg via SUBCUTANEOUS
  Filled 2019-12-25: qty 1.7

## 2019-12-25 NOTE — Progress Notes (Signed)
Serum Creatinine 1.80 today for xgeva shot.  Reviewed labs with Reynolds Bowl, NP, with verbal order ok to treat today with xgeva.   Patient taking calcium as directed.  Denied tooth, jaw, and leg pain.  No recent or upcoming dental visits.  Patient tolerated injection with no complaints voiced.  Site clean and dry with no bruising or swelling noted at site.  Band aid applied.  Vss with discharge and left ambulatory with no s/s of distress noted.

## 2019-12-26 ENCOUNTER — Inpatient Hospital Stay (HOSPITAL_COMMUNITY): Payer: Medicare Other

## 2019-12-26 VITALS — BP 123/65 | HR 58 | Temp 96.8°F | Resp 18 | Wt 215.0 lb

## 2019-12-26 DIAGNOSIS — C9000 Multiple myeloma not having achieved remission: Secondary | ICD-10-CM

## 2019-12-26 DIAGNOSIS — Z5111 Encounter for antineoplastic chemotherapy: Secondary | ICD-10-CM | POA: Diagnosis not present

## 2019-12-26 LAB — CBC WITH DIFFERENTIAL/PLATELET
Abs Immature Granulocytes: 0.01 10*3/uL (ref 0.00–0.07)
Basophils Absolute: 0.1 10*3/uL (ref 0.0–0.1)
Basophils Relative: 1 %
Eosinophils Absolute: 0.2 10*3/uL (ref 0.0–0.5)
Eosinophils Relative: 4 %
HCT: 31.8 % — ABNORMAL LOW (ref 39.0–52.0)
Hemoglobin: 10.6 g/dL — ABNORMAL LOW (ref 13.0–17.0)
Immature Granulocytes: 0 %
Lymphocytes Relative: 26 %
Lymphs Abs: 1.4 10*3/uL (ref 0.7–4.0)
MCH: 36.2 pg — ABNORMAL HIGH (ref 26.0–34.0)
MCHC: 33.3 g/dL (ref 30.0–36.0)
MCV: 108.5 fL — ABNORMAL HIGH (ref 80.0–100.0)
Monocytes Absolute: 0.6 10*3/uL (ref 0.1–1.0)
Monocytes Relative: 10 %
Neutro Abs: 3.3 10*3/uL (ref 1.7–7.7)
Neutrophils Relative %: 59 %
Platelets: 194 10*3/uL (ref 150–400)
RBC: 2.93 MIL/uL — ABNORMAL LOW (ref 4.22–5.81)
RDW: 12.9 % (ref 11.5–15.5)
WBC: 5.5 10*3/uL (ref 4.0–10.5)
nRBC: 0 % (ref 0.0–0.2)

## 2019-12-26 LAB — KAPPA/LAMBDA LIGHT CHAINS
Kappa free light chain: 101.2 mg/L — ABNORMAL HIGH (ref 3.3–19.4)
Kappa, lambda light chain ratio: 31.63 — ABNORMAL HIGH (ref 0.26–1.65)
Lambda free light chains: 3.2 mg/L — ABNORMAL LOW (ref 5.7–26.3)

## 2019-12-26 LAB — PROTEIN ELECTROPHORESIS, SERUM
A/G Ratio: 1.9 — ABNORMAL HIGH (ref 0.7–1.7)
Albumin ELP: 3.8 g/dL (ref 2.9–4.4)
Alpha-1-Globulin: 0.2 g/dL (ref 0.0–0.4)
Alpha-2-Globulin: 0.6 g/dL (ref 0.4–1.0)
Beta Globulin: 0.8 g/dL (ref 0.7–1.3)
Gamma Globulin: 0.4 g/dL (ref 0.4–1.8)
Globulin, Total: 2 g/dL — ABNORMAL LOW (ref 2.2–3.9)
M-Spike, %: 0.2 g/dL — ABNORMAL HIGH
Total Protein ELP: 5.8 g/dL — ABNORMAL LOW (ref 6.0–8.5)

## 2019-12-26 MED ORDER — SODIUM CHLORIDE 0.9% FLUSH
10.0000 mL | INTRAVENOUS | Status: DC | PRN
  Administered 2019-12-26: 14:00:00 10 mL

## 2019-12-26 MED ORDER — ACETAMINOPHEN 325 MG PO TABS
650.0000 mg | ORAL_TABLET | Freq: Once | ORAL | Status: AC
  Administered 2019-12-26: 650 mg via ORAL
  Filled 2019-12-26: qty 2

## 2019-12-26 MED ORDER — HEPARIN SOD (PORK) LOCK FLUSH 100 UNIT/ML IV SOLN
500.0000 [IU] | Freq: Once | INTRAVENOUS | Status: AC | PRN
  Administered 2019-12-26: 16:00:00 500 [IU]

## 2019-12-26 MED ORDER — SODIUM CHLORIDE 0.9 % IV SOLN
20.0000 mg | Freq: Once | INTRAVENOUS | Status: AC
  Administered 2019-12-26: 20 mg via INTRAVENOUS
  Filled 2019-12-26: qty 20

## 2019-12-26 MED ORDER — SODIUM CHLORIDE 0.9 % IV SOLN: Freq: Once | INTRAVENOUS | Status: AC

## 2019-12-26 MED ORDER — DEXTROSE 5 % IV SOLN
34.0000 mg/m2 | Freq: Once | INTRAVENOUS | Status: AC
  Administered 2019-12-26: 15:00:00 70 mg via INTRAVENOUS
  Filled 2019-12-26: qty 30

## 2019-12-26 NOTE — Progress Notes (Signed)
12/26/19  Ok to proceed with scr 1.8  T.O. Reynolds Bowl, NP-C/Bonnie Allean Found, RN/Taylon Louison Ronnald Ramp, PharmD

## 2019-12-26 NOTE — Progress Notes (Signed)
Patient to treatment room for chemotherapy.  Reviewed serum Creatinine 1.8 with Johnny Bowl, NP, with verbal order ok to treat today.    Patient stated after last treatment he felt more fatigued.  He also stated he had been doing more work to get ready for the holidays.  [name] stated he feels better today and denied any fevers or chills.  No s/s of distress noted.    Patient tolerated chemotherapy with no complaints voiced.  Port site clean and dry with no bruising or swelling noted at site.  Good blood return noted before and after administration of chemotherapy.  Band aid applied.  Patient left ambulatory with VSS and no s/s of distress noted.

## 2020-01-01 DIAGNOSIS — L258 Unspecified contact dermatitis due to other agents: Secondary | ICD-10-CM | POA: Diagnosis not present

## 2020-01-01 DIAGNOSIS — L720 Epidermal cyst: Secondary | ICD-10-CM | POA: Diagnosis not present

## 2020-01-09 ENCOUNTER — Other Ambulatory Visit: Payer: Self-pay

## 2020-01-09 ENCOUNTER — Inpatient Hospital Stay (HOSPITAL_COMMUNITY): Payer: Medicare Other

## 2020-01-09 ENCOUNTER — Encounter (HOSPITAL_COMMUNITY): Payer: Self-pay | Admitting: Hematology

## 2020-01-09 ENCOUNTER — Inpatient Hospital Stay (HOSPITAL_BASED_OUTPATIENT_CLINIC_OR_DEPARTMENT_OTHER): Payer: Medicare Other | Admitting: Hematology

## 2020-01-09 ENCOUNTER — Inpatient Hospital Stay (HOSPITAL_COMMUNITY): Payer: Medicare Other | Attending: Hematology

## 2020-01-09 VITALS — BP 142/72 | HR 69 | Temp 97.7°F | Resp 18 | Wt 216.0 lb

## 2020-01-09 VITALS — BP 126/63 | HR 68 | Temp 97.5°F | Resp 18

## 2020-01-09 DIAGNOSIS — I700 Atherosclerosis of aorta: Secondary | ICD-10-CM | POA: Diagnosis not present

## 2020-01-09 DIAGNOSIS — G629 Polyneuropathy, unspecified: Secondary | ICD-10-CM | POA: Insufficient documentation

## 2020-01-09 DIAGNOSIS — Z923 Personal history of irradiation: Secondary | ICD-10-CM | POA: Insufficient documentation

## 2020-01-09 DIAGNOSIS — I2510 Atherosclerotic heart disease of native coronary artery without angina pectoris: Secondary | ICD-10-CM | POA: Insufficient documentation

## 2020-01-09 DIAGNOSIS — C9000 Multiple myeloma not having achieved remission: Secondary | ICD-10-CM

## 2020-01-09 DIAGNOSIS — I10 Essential (primary) hypertension: Secondary | ICD-10-CM | POA: Diagnosis not present

## 2020-01-09 DIAGNOSIS — M545 Low back pain: Secondary | ICD-10-CM | POA: Insufficient documentation

## 2020-01-09 DIAGNOSIS — K219 Gastro-esophageal reflux disease without esophagitis: Secondary | ICD-10-CM | POA: Insufficient documentation

## 2020-01-09 DIAGNOSIS — C7951 Secondary malignant neoplasm of bone: Secondary | ICD-10-CM | POA: Diagnosis not present

## 2020-01-09 DIAGNOSIS — Z9221 Personal history of antineoplastic chemotherapy: Secondary | ICD-10-CM | POA: Diagnosis not present

## 2020-01-09 DIAGNOSIS — Z79899 Other long term (current) drug therapy: Secondary | ICD-10-CM | POA: Diagnosis not present

## 2020-01-09 DIAGNOSIS — C50922 Malignant neoplasm of unspecified site of left male breast: Secondary | ICD-10-CM | POA: Insufficient documentation

## 2020-01-09 DIAGNOSIS — D539 Nutritional anemia, unspecified: Secondary | ICD-10-CM | POA: Insufficient documentation

## 2020-01-09 DIAGNOSIS — Z5111 Encounter for antineoplastic chemotherapy: Secondary | ICD-10-CM | POA: Diagnosis not present

## 2020-01-09 DIAGNOSIS — G8929 Other chronic pain: Secondary | ICD-10-CM | POA: Diagnosis not present

## 2020-01-09 DIAGNOSIS — Z7982 Long term (current) use of aspirin: Secondary | ICD-10-CM | POA: Diagnosis not present

## 2020-01-09 LAB — CBC WITH DIFFERENTIAL/PLATELET
Abs Immature Granulocytes: 0.02 10*3/uL (ref 0.00–0.07)
Basophils Absolute: 0 10*3/uL (ref 0.0–0.1)
Basophils Relative: 1 %
Eosinophils Absolute: 0.3 10*3/uL (ref 0.0–0.5)
Eosinophils Relative: 4 %
HCT: 30.6 % — ABNORMAL LOW (ref 39.0–52.0)
Hemoglobin: 10 g/dL — ABNORMAL LOW (ref 13.0–17.0)
Immature Granulocytes: 0 %
Lymphocytes Relative: 18 %
Lymphs Abs: 1.2 10*3/uL (ref 0.7–4.0)
MCH: 35.8 pg — ABNORMAL HIGH (ref 26.0–34.0)
MCHC: 32.7 g/dL (ref 30.0–36.0)
MCV: 109.7 fL — ABNORMAL HIGH (ref 80.0–100.0)
Monocytes Absolute: 0.5 10*3/uL (ref 0.1–1.0)
Monocytes Relative: 7 %
Neutro Abs: 4.6 10*3/uL (ref 1.7–7.7)
Neutrophils Relative %: 70 %
Platelets: 183 10*3/uL (ref 150–400)
RBC: 2.79 MIL/uL — ABNORMAL LOW (ref 4.22–5.81)
RDW: 13.2 % (ref 11.5–15.5)
WBC: 6.6 10*3/uL (ref 4.0–10.5)
nRBC: 0 % (ref 0.0–0.2)

## 2020-01-09 LAB — COMPREHENSIVE METABOLIC PANEL
ALT: 16 U/L (ref 0–44)
AST: 15 U/L (ref 15–41)
Albumin: 3.6 g/dL (ref 3.5–5.0)
Alkaline Phosphatase: 26 U/L — ABNORMAL LOW (ref 38–126)
Anion gap: 6 (ref 5–15)
BUN: 17 mg/dL (ref 8–23)
CO2: 26 mmol/L (ref 22–32)
Calcium: 9 mg/dL (ref 8.9–10.3)
Chloride: 107 mmol/L (ref 98–111)
Creatinine, Ser: 1.65 mg/dL — ABNORMAL HIGH (ref 0.61–1.24)
GFR calc Af Amer: 48 mL/min — ABNORMAL LOW (ref >60)
GFR calc non Af Amer: 41 mL/min — ABNORMAL LOW (ref >60)
Glucose, Bld: 103 mg/dL — ABNORMAL HIGH (ref 70–99)
Potassium: 3.9 mmol/L (ref 3.5–5.1)
Sodium: 139 mmol/L (ref 135–145)
Total Bilirubin: 0.4 mg/dL (ref 0.3–1.2)
Total Protein: 5.9 g/dL — ABNORMAL LOW (ref 6.5–8.1)

## 2020-01-09 LAB — LACTATE DEHYDROGENASE: LDH: 125 U/L (ref 98–192)

## 2020-01-09 MED ORDER — SODIUM CHLORIDE 0.9% FLUSH
10.0000 mL | INTRAVENOUS | Status: DC | PRN
  Administered 2020-01-09: 10 mL

## 2020-01-09 MED ORDER — SODIUM CHLORIDE 0.9 % IV SOLN: Freq: Once | INTRAVENOUS | Status: AC

## 2020-01-09 MED ORDER — DEXTROSE 5 % IV SOLN
58.0000 mg/m2 | Freq: Once | INTRAVENOUS | Status: AC
  Administered 2020-01-09: 120 mg via INTRAVENOUS
  Filled 2020-01-09: qty 60

## 2020-01-09 MED ORDER — ACETAMINOPHEN 325 MG PO TABS
650.0000 mg | ORAL_TABLET | Freq: Once | ORAL | Status: AC
  Administered 2020-01-09: 650 mg via ORAL
  Filled 2020-01-09: qty 2

## 2020-01-09 MED ORDER — SODIUM CHLORIDE 0.9 % IV SOLN
20.0000 mg | Freq: Once | INTRAVENOUS | Status: AC
  Administered 2020-01-09: 20 mg via INTRAVENOUS
  Filled 2020-01-09: qty 20

## 2020-01-09 MED ORDER — HEPARIN SOD (PORK) LOCK FLUSH 100 UNIT/ML IV SOLN
500.0000 [IU] | Freq: Once | INTRAVENOUS | Status: AC | PRN
  Administered 2020-01-09: 500 [IU]

## 2020-01-09 NOTE — Progress Notes (Signed)
Message received from [facility] LPN proceed with treatment. Dose increase today per [facility] LPN. Creatinine 1.65. MD aware and labs reviewed prior to proceed with treatment order.   Treatment given today per MD orders. Tolerated infusion without adverse affects. Vital signs stable. No complaints at this time. Discharged from clinic ambulatory. F/U with [facility] as scheduled.

## 2020-01-09 NOTE — Assessment & Plan Note (Signed)
1.  Stage II IgG kappa multiple myeloma (Dx February 2018): -RVD from 03/01/2017 through 06/29/2017 -Chemotherapy changed to 3 cycles of CCyD from 07/12/2017-04/2017 to obtain deeper response - Status post auto stem cell transplant on 10/08/2017 -Stable M spike after transplant, treated with 2 cycles of consolidation with CCyD on 02/14/2018 and 03/14/2018.  -His M spike became undetectable after consolidation.  He was placed on carfilzomib 70 mg/m2 maintenance every 2 weeks on 04/20/2018.  He had admission with fever and chills and required 1 unit of blood transfusion. - Subsequently he tolerated carfilzomib and smaller doses very well.  We have increased his doses to 60 mg/m on 09/01/2018 and he tolerated 4 doses very well with the addition of dexamethasone. - Carfilzomib dose was increased to 70 mg per metered square on 10/27/2018.   -He was hospitalized on 11/29/2018 through 12/05/2018 with bronchitis, positive for parainfluenza, acute renal failure and severe thrombocytopenia.  His platelet count went down to 9.  We have given IVIG 1 g/kg x 2 doses.  Platelet count has slowly improved.  Review of literature showed carfilzomib can cause dose-related microangiopathy. -Carfilzomib restarted at reduced dose of 35 mg/m2 on 01/05/2019. -We reviewed myeloma panel from 12/25/2019.  M spike is stable at 0.2 g.  However kappa light chains increased to 101 from 81 previously.  Ratio has increased to 31.63, previously 24. -I have recommended change in treatment plan at this time.  We discussed various options.  Patient was more comfortable with increasing the dose of carfilzomib at this time rather than changing the whole regimen.  We will increase carfilzomib to 56 mg/m2 starting today.  He is receiving 20 mg of dexamethasone IV on days of carfilzomib.  If needed we can also change to weekly regimen at previous dose.  As evidence previously, he could not tolerate 70 mg/m2 dose. -I will reevaluate him in 4 weeks.  We will  plan to repeat myeloma panel at that time.  We will also evaluate how he is tolerating the increased dose.   2.  Metastatic left breast cancer to the bones: -Diagnosed in 2011, will continue tamoxifen.  3.  Peripheral neuropathy: -He will continue gabapentin 300 mg twice daily.  4.  ID prophylaxis: -He will continue acyclovir 400 mg twice daily.  5.  Myeloma bone disease: -He is on denosumab, last dose on 12/25/2019.  He had a root canal treatment done. -He was evaluated by oral surgeon and was recommended a biopsy in 2 weeks.  I will hold his next dose towards the end of this month.  6.  Macrocytic anemia: -Etiology includes CKD and myeloma treatments. -He is receiving intermittent Procrit.  Hemoglobin today is 10.6.

## 2020-01-09 NOTE — Patient Instructions (Signed)
[facility] Discharge Instructions for Patients Receiving Chemotherapy  Today you received the following chemotherapy agents   To help prevent nausea and vomiting after your treatment, we encourage you to take your nausea medication   If you develop nausea and vomiting that is not controlled by your nausea medication, call the clinic.   BELOW ARE SYMPTOMS THAT SHOULD BE REPORTED IMMEDIATELY:  *FEVER GREATER THAN 100.5 F  *CHILLS WITH OR WITHOUT FEVER  NAUSEA AND VOMITING THAT IS NOT CONTROLLED WITH YOUR NAUSEA MEDICATION  *UNUSUAL SHORTNESS OF BREATH  *UNUSUAL BRUISING OR BLEEDING  TENDERNESS IN MOUTH AND THROAT WITH OR WITHOUT PRESENCE OF ULCERS  *URINARY PROBLEMS  *BOWEL PROBLEMS  UNUSUAL RASH Items with * indicate a potential emergency and should be followed up as soon as possible.  Feel free to call the clinic should you have any questions or concerns. The clinic phone number is [phone].  Please show the CHEMO ALERT CARD at check-in to the Emergency Department and triage nurse.   

## 2020-01-09 NOTE — Patient Instructions (Addendum)
[facility] Discharge Instructions  You were seen today by Dr. Katragadda. He went over your recent lab results. He will see you back in 4 weeks for labs, treatment and follow up.   Thank you for choosing  Can[facility] Center at [facility] to provide your oncology and hematology care.  To afford each patient quality time with our provider, please arrive at least 15 minutes before your scheduled appointment time.   If you have a lab appointment with the Cancer Center please come in thru the  Main Entrance and check in at the main information desk  You need to re-schedule your appointment should you arrive 10 or more minutes late.  We strive to give you quality time with our providers, and arriving late affects you and other patients whose appointments are after yours.  Also, if you no show three or more times for appointments you may be dismissed from the clinic at the providers discretion.     Again, thank you for choosing [facility].  Our hope is that these requests will decrease the amount of time that you wait before being seen by our physicians.       _____________________________________________________________  Should you have questions after your visit to [facility], please contact our office at [phone] between the hours of 8:00 a.m. and 4:30 p.m.  Voicemails left after 4:00 p.m. will not be returned until the following business day.  For prescription refill requests, have your pharmacy contact our office and allow 72 hours.    Cancer Center Support Programs:   > Cancer Support Group  2nd Tuesday of the month 1pm-2pm, Journey Room    

## 2020-01-09 NOTE — Progress Notes (Signed)
[address], [place] [address]   CLINIC:  Medical Oncology/Hematology  PCP:  [name], MD [place] [phone]   REASON FOR VISIT:  Follow-up for Multiple Myeloma  CURRENT THERAPY: Carfilzomib   BRIEF ONCOLOGIC HISTORY:  Oncology History  Breast cancer, male ([place])  12/31/2009 Initial Biopsy   Biopsy of L breast    12/31/2009 Pathology Results   Invasive ductal carcinoma, ER/PR+, HER 2 negative   12/31/2009 Imaging   Ultrasound showing a 2.43 x 1.85 x 3 cm hypoechoic spiculated mass in the 12 o clock L breast retroareolar region   01/01/2010 -  Anti-estrogen oral therapy   Tamoxifen 20 mg daily   01/06/2010 Imaging   Bone scan abnormal uptake in the diaphysis of the R humerus, abnormal in the R third, fifth and sixth ribs, lesion also noted in the sternum.   02/03/2010 Surgery   Rod placement and fixation of R humerus by Dr. [name]   02/05/2010 - 02/18/2010 Radiation Therapy   30Gy in 10 fractions of 3 Gy per fraction to R pathologic fracture   03/11/2010 -  Chemotherapy   Denosumab monthly, now every 3 months. Started at [facility]    06/09/2016 Imaging   Three hypermetabolic osseous lesions in the sternum, left ilium and right ilium, as discussed above, likely represent osseous metastases. At this time, these are not recognizable on the CT images. 2. No extra skeletal metastatic disease identified in the neck, chest, abdomen or pelvis.   10/13/2016 Progression   PET shows various new and enlarging osseous metastatic lesions with no definite extra osseous metastatic disease currently identified.    12/31/2016 Progression   1. Multifocal hypermetabolic osseous metastases throughout the axial and proximal appendicular skeleton, which are increased in size, number and metabolism since 10/13/2016 PET-CT. 2. 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 3. No additional sites of hypermetabolic metastatic disease. 4. Chronic right mastoid sinusitis. 5. Aortic atherosclerosis.  One vessel coronary atherosclerosis.   Multiple myeloma ([place])  02/12/2017 Bone Marrow Biopsy   The marrow was variably cellular with large peritrabecular aggregates of kappa restricted plasma cells (66% by aspirate, 30% by Cd138). Cytogenetics +11.    03/01/2017 - 06/29/2017 Chemotherapy   RVD    05/26/2017 Bone Marrow Biopsy   Performed at [facility]:  Plasma cell myeloma in a 30% cellularmarrow with decreased trilineage hematopoiesis and 42% kappalight chain restricted plasma cells on the aspirate smears andlarge aggregates on the core biopsy.    07/12/2017 - 09/01/2017 Chemotherapy   3 cycles of carfizolmib/cyclophosphamide/dexamethasone     10/08/2017 Bone Marrow Transplant   Autotransplant at [facility]      CANCER STAGING: Cancer Staging Multiple myeloma [facility]) Staging form: Plasma Cell Myeloma and Plasma Cell Disorders, AJCC 8th Edition - Clinical stage from 05/03/2017: Beta-2-microglobulin (mg/L): 3.5, Albumin (g/dL): 3.4, ISS: Stage II, High-risk cytogenetics: Absent, LDH: Not assessed - Signed by Baird Cancer, PA-C on 05/03/2017    INTERVAL HISTORY:  Mr. Walbert 74 y.o. male seen for follow-up of multiple myeloma, breast cancer and anemia.  He is receiving carfilzomib every 2 weeks with dexamethasone.  Appetite is 75%.  Energy levels are 50%.  Chronic back pain in the lower back is stable and rated 8 out of 10.  Numbness in the legs has been stable.  Denies any fevers  or night sweats.  Denies any symptoms of PND or orthopnea.  Mild itching on the legs reported.  REVIEW OF SYSTEMS:  Review of Systems  Skin: Positive for itching.  Neurological: Positive for numbness.  All other systems reviewed and are negative.    PAST MEDICAL/SURGICAL HISTORY:  Past Medical History:    Diagnosis Date   Anxiety    Bone metastases ([place]) 09/10/2016   Breast cancer ([place]) 2011   Stave IV breast cancer; radiation and tamoxifen   Breast cancer, male ([place])    Stave IV breast cancer; radiation and tamoxifen  Overview:  Left breast ca with mets bone  Overview:  METS TO BONE   GERD (gastroesophageal reflux disease)    Hypertension    Macular degeneration    Multiple myeloma ([place]) 02/18/2017   Peripheral neuropathy    Past Surgical History:  Procedure Laterality Date   BACK SURGERY     CATARACT EXTRACTION W/PHACO Left 02/03/2019   Procedure: CATARACT EXTRACTION PHACO AND INTRAOCULAR LENS PLACEMENT ([place]);  Surgeon: Baruch Goldmann, MD;  Location: [facility];  Service: Ophthalmology;  Laterality: Left;  CDE: 15.89   HERNIA REPAIR     PORTACATH PLACEMENT Left 06/10/2018   Procedure: INSERTION PORT-A-CATH;  Surgeon: Virl Cagey, MD;  Location: [facility];  Service: General;  Laterality: Left;   right arm surgery       SOCIAL HISTORY:  Social History   Socioeconomic History   Marital status: Married    Spouse name: Not on file   Number of children: 3   Years of education: Not on file   Highest education level: Not on file  Occupational History   Not on file  Tobacco Use   Smoking status: Never Smoker   Smokeless tobacco: Former Systems developer  Substance and Sexual Activity   Alcohol use: Yes    Alcohol/week: 24.0 standard drinks    Types: 24 Cans of beer per week   Drug use: No   Sexual activity: Not on file  Other Topics Concern   Not on file  Social History Narrative   Not on file   Social Determinants of Health   Financial Resource Strain:    Difficulty of Paying Living Expenses: Not on file  Food Insecurity:    Worried About [place] in the Last Year: Not on file   Ran Out of Food in the Last Year: Not on file  Transportation Needs:    Lack of Transportation (Medical): Not on file   Lack of Transportation (Non-Medical): Not  on file  Physical Activity:    Days of Exercise per Week: Not on file   Minutes of Exercise per Session: Not on file  Stress:    Feeling of Stress : Not on file  Social Connections:    Frequency of Communication with Friends and Family: Not on file   Frequency of Social Gatherings with Friends and Family: Not on file   Attends Religious Services: Not on file   Active Member of Clubs or Organizations: Not on file   Attends Archivist Meetings: Not on file   Marital Status: Not on file  Intimate Partner Violence:    Fear of Current or Ex-Partner: Not on file   Emotionally Abused: Not on file   Physically Abused: Not on file   Sexually Abused: Not on file    FAMILY HISTORY:  Family History  Problem Relation Age of Onset   Stroke Mother    Cancer Maternal Aunt  cancer NOS; died in her 41s   Lung cancer Maternal Uncle        smoker    CURRENT MEDICATIONS:  Outpatient Encounter Medications as of 01/09/2020  Medication Sig Note   acyclovir (ZOVIRAX) 800 MG tablet TAKE 1 TABLET TWICE DAILY    amLODipine (NORVASC) 5 MG tablet Take 1 tablet (5 mg total) by mouth daily.    aspirin EC 81 MG tablet Take 81 mg by mouth daily.    calcium-vitamin D (OSCAL WITH D) 500-200 MG-UNIT TABS tablet Take 1 tablet by mouth 2 (two) times daily.    CARFILZOMIB IV Inject into the vein.    Cholecalciferol (VITAMIN D) 50 MCG (2000 UT) CAPS Take 4,000 Units by mouth 2 (two) times daily.    denosumab (XGEVA) 120 MG/1.7ML SOLN injection Inject 120 mg into the skin once. 01/18/2019: GIVEN AT HOSPITAL    folic acid (FOLVITE) 1 MG tablet TAKE 1 TABLET BY MOUTH DAILY    gabapentin (NEURONTIN) 300 MG capsule Take 1 capsule (300 mg total) by mouth 2 (two) times daily.    hydrALAZINE (APRESOLINE) 50 MG tablet Take 1 tablet (50 mg total) by mouth every 8 (eight) hours. (Patient taking differently: Take 50 mg by mouth 2 (two) times daily. )    loratadine (CLARITIN) 10 MG  tablet Take 1 tablet (10 mg total) by mouth daily.    metoprolol succinate (TOPROL-XL) 100 MG 24 hr tablet Take 100 mg by mouth daily.     Multiple Vitamin (MULTIVITAMIN WITH MINERALS) TABS tablet Take 1 tablet by mouth daily.    pantoprazole (PROTONIX) 40 MG tablet Take 40 mg by mouth every other day.     tamoxifen (NOLVADEX) 20 MG tablet TAKE 1 TABLET (20 MG TOTAL) BY MOUTH DAILY.    vitamin B-12 (CYANOCOBALAMIN) 1000 MCG tablet Take 1,000 mcg by mouth daily.    ALPRAZolam (XANAX) 0.5 MG tablet TAKE 1 TABLET BY MOUTH AT BEDTIME AS NEEDED FOR ANXIETY OR SLEEP (Patient not taking: Reported on 01/09/2020)    augmented betamethasone dipropionate (DIPROLENE-AF) 0.05 % cream Apply topically as needed.     diclofenac Sodium (VOLTAREN) 1 % GEL Use 1 to 2 times per day as directed by doctor    ondansetron (ZOFRAN) 8 MG tablet TAKE ONE TABLET BY MOUTH EVERY 8 HOURS AS NEEDED (Patient not taking: Reported on 01/09/2020)    polyethylene glycol (MIRALAX / GLYCOLAX) packet Take 17 g by mouth daily as needed for mild constipation. (Patient not taking: Reported on 01/09/2020)    prochlorperazine (COMPAZINE) 10 MG tablet Take 1 tablet (10 mg total) by mouth every 6 (six) hours as needed for nausea or vomiting. (Patient not taking: Reported on 01/09/2020)    pseudoephedrine-guaifenesin (MUCINEX D) 60-600 MG 12 hr tablet Take 1 tablet by mouth 2 (two) times daily as needed for congestion.     [DISCONTINUED] ALPRAZolam (XANAX) 0.5 MG tablet TAKE 1 TABLET BY MOUTH AT BEDTIME AS NEEDED FOR ANXIETY OR SLEEP    Facility-Administered Encounter Medications as of 01/09/2020  Medication   heparin lock flush 100 unit/mL   sodium chloride flush (NS) 0.9 % injection 10 mL   sodium chloride flush (NS) 0.9 % injection 10 mL    ALLERGIES:  Allergies  Allergen Reactions   Cefepime     Suspected severe thrombocytopenia is a result of cefepime induced antigen platelet destruction     PHYSICAL EXAM:  ECOG  Performance status: 1  Vitals:   01/09/20 1202  BP: (!) 142/72  Pulse: 69  Resp: 18  Temp: 97.7 F (36.5 C)  SpO2: 98%   Filed Weights   01/09/20 1202  Weight: 216 lb (98 kg)    Physical Exam Constitutional:      Appearance: Normal appearance.  HENT:     Head: Normocephalic.     Nose: Nose normal.     Mouth/Throat:     Mouth: Mucous membranes are moist.     Pharynx: Oropharynx is clear.  Eyes:     Extraocular Movements: Extraocular movements intact.     Conjunctiva/sclera: Conjunctivae normal.  Cardiovascular:     Rate and Rhythm: Normal rate and regular rhythm.     Pulses: Normal pulses.     Heart sounds: Normal heart sounds.  Pulmonary:     Effort: Pulmonary effort is normal.     Breath sounds: Normal breath sounds.  Abdominal:     General: Bowel sounds are normal.     Palpations: Abdomen is soft.  Musculoskeletal:        General: Normal range of motion.     Cervical back: Normal range of motion.  Skin:    General: Skin is warm and dry.  Neurological:     General: No focal deficit present.     Mental Status: He is alert and oriented to person, place, and time. Mental status is at baseline.  Psychiatric:        Mood and Affect: Mood normal.        Behavior: Behavior normal.        Thought Content: Thought content normal.        Judgment: Judgment normal.      LABORATORY DATA:  I have reviewed the labs as listed.  CBC    Component Value Date/Time   WBC 5.5 12/26/2019 1258   RBC 2.93 (L) 12/26/2019 1258   HGB 10.6 (L) 12/26/2019 1258   HCT 31.8 (L) 12/26/2019 1258   PLT 194 12/26/2019 1258   MCV 108.5 (H) 12/26/2019 1258   MCH 36.2 (H) 12/26/2019 1258   MCHC 33.3 12/26/2019 1258   RDW 12.9 12/26/2019 1258   LYMPHSABS 1.4 12/26/2019 1258   MONOABS 0.6 12/26/2019 1258   EOSABS 0.2 12/26/2019 1258   BASOSABS 0.1 12/26/2019 1258   CMP Latest Ref Rng & Units 01/09/2020 12/25/2019 12/12/2019  Glucose 70 - 99 mg/dL 103(H) 115(H) 102(H)  BUN 8 - 23  mg/dL '17 18 21  '$ Creatinine 0.61 - 1.24 mg/dL 1.65(H) 1.80(H) 1.72(H)  Sodium 135 - 145 mmol/L 139 140 138  Potassium 3.5 - 5.1 mmol/L 3.9 3.6 4.3  Chloride 98 - 111 mmol/L 107 104 104  CO2 22 - 32 mmol/L '26 24 27  '$ Calcium 8.9 - 10.3 mg/dL 9.0 9.2 9.2  Total Protein 6.5 - 8.1 g/dL 5.9(L) 6.3(L) 6.0(L)  Total Bilirubin 0.3 - 1.2 mg/dL 0.4 0.6 0.5  Alkaline Phos 38 - 126 U/L 26(L) 23(L) 25(L)  AST 15 - 41 U/L '15 17 21  '$ ALT 0 - 44 U/L '16 20 20    '$ Radiology: I have reviewed his previous scans.  ASSESSMENT & PLAN:   Multiple myeloma ([place]) 1.  Stage II IgG kappa multiple myeloma (Dx February 2018): -RVD from 03/01/2017 through 06/29/2017 -Chemotherapy changed to 3 cycles of CCyD from 07/12/2017-04/2017 to obtain deeper response - Status post auto stem cell transplant on 10/08/2017 -Stable M spike after transplant, treated with 2 cycles of consolidation with CCyD on 02/14/2018 and 03/14/2018.  -His M spike became undetectable after consolidation.  He was placed on carfilzomib 70 mg/m2 maintenance every 2 weeks on 04/20/2018.  He had admission with fever and chills and required 1 unit of blood transfusion. - Subsequently he tolerated carfilzomib and smaller doses very well.  We have increased his doses to 60 mg/m on 09/01/2018 and he tolerated 4 doses very well with the addition of dexamethasone. - Carfilzomib dose was increased to 70 mg per metered square on 10/27/2018.   -He was hospitalized on 11/29/2018 through 12/05/2018 with bronchitis, positive for parainfluenza, acute renal failure and severe thrombocytopenia.  His platelet count went down to 9.  We have given IVIG 1 g/kg x 2 doses.  Platelet count has slowly improved.  Review of literature showed carfilzomib can cause dose-related microangiopathy. -Carfilzomib restarted at reduced dose of 35 mg/m2 on 01/05/2019. -We reviewed myeloma panel from 12/25/2019.  M spike is stable at 0.2 g.  However kappa light chains increased to 101 from 81 previously.   Ratio has increased to 31.63, previously 24. -I have recommended change in treatment plan at this time.  We discussed various options.  Patient was more comfortable with increasing the dose of carfilzomib at this time rather than changing the whole regimen.  We will increase carfilzomib to 56 mg/m2 starting today.  He is receiving 20 mg of dexamethasone IV on days of carfilzomib.  If needed we can also change to weekly regimen at previous dose.  As evidence previously, he could not tolerate 70 mg/m2 dose. -I will reevaluate him in 4 weeks.  We will plan to repeat myeloma panel at that time.  We will also evaluate how he is tolerating the increased dose.   2.  Metastatic left breast cancer to the bones: -Diagnosed in 2011, will continue tamoxifen.  3.  Peripheral neuropathy: -He will continue gabapentin 300 mg twice daily.  4.  ID prophylaxis: -He will continue acyclovir 400 mg twice daily.  5.  Myeloma bone disease: -He is on denosumab, last dose on 12/25/2019.  He had a root canal treatment done. -He was evaluated by oral surgeon and was recommended a biopsy in 2 weeks.  I will hold his next dose towards the end of this month.  6.  Macrocytic anemia: -Etiology includes CKD and myeloma treatments. -He is receiving intermittent Procrit.  Hemoglobin today is 10.6.     Orders placed this encounter:  Orders Placed This Encounter  Procedures   CBC with Differential/Platelet   Comprehensive metabolic panel   Protein electrophoresis, serum   Kappa/lambda light chains   Lactate dehydrogenase      Derek Jack, MD [place] [phone]

## 2020-01-10 ENCOUNTER — Telehealth (HOSPITAL_COMMUNITY): Payer: Self-pay | Admitting: *Deleted

## 2020-01-10 LAB — KAPPA/LAMBDA LIGHT CHAINS
Kappa free light chain: 123.5 mg/L — ABNORMAL HIGH (ref 3.3–19.4)
Kappa, lambda light chain ratio: 16.69 — ABNORMAL HIGH (ref 0.26–1.65)
Lambda free light chains: 7.4 mg/L (ref 5.7–26.3)

## 2020-01-10 LAB — PROTEIN ELECTROPHORESIS, SERUM
A/G Ratio: 2 — ABNORMAL HIGH (ref 0.7–1.7)
Albumin ELP: 3.6 g/dL (ref 2.9–4.4)
Alpha-1-Globulin: 0.2 g/dL (ref 0.0–0.4)
Alpha-2-Globulin: 0.5 g/dL (ref 0.4–1.0)
Beta Globulin: 0.7 g/dL (ref 0.7–1.3)
Gamma Globulin: 0.4 g/dL (ref 0.4–1.8)
Globulin, Total: 1.8 g/dL — ABNORMAL LOW (ref 2.2–3.9)
M-Spike, %: 0.2 g/dL — ABNORMAL HIGH
Total Protein ELP: 5.4 g/dL — ABNORMAL LOW (ref 6.0–8.5)

## 2020-01-10 NOTE — Telephone Encounter (Signed)
Returned patient call for 99.7 fever. I spoke with both the patient and his wife about what happened. The patient stated that he put his coat on and walked to the mailbox, when he got back from the mailbox he kept his coat on and then had a hot flash and got sick feeling. He vomited after taking a nausea pill. He stated that his wife checked his temperature and it was 99.7, she gave him an Advil. The patient stated that he has not thrown up anymore since about 5:00. He states that he thinks that he is fine and that he doesn't think the treatment did it. I advised his wife that per Dr. Delton Coombes, if his temperature got up to 101 to take him to the ER. She verbalized understanding. They were also advised to call us back if anything changed.

## 2020-01-14 IMAGING — CR DG CHEST 1V PORT
1 series · 1 of 1 positions shown · non-contrast
Comparison: April 20, 2018

CLINICAL DATA: Port-A-Cath placement

EXAM:
PORTABLE CHEST 1 VIEW

[portable]
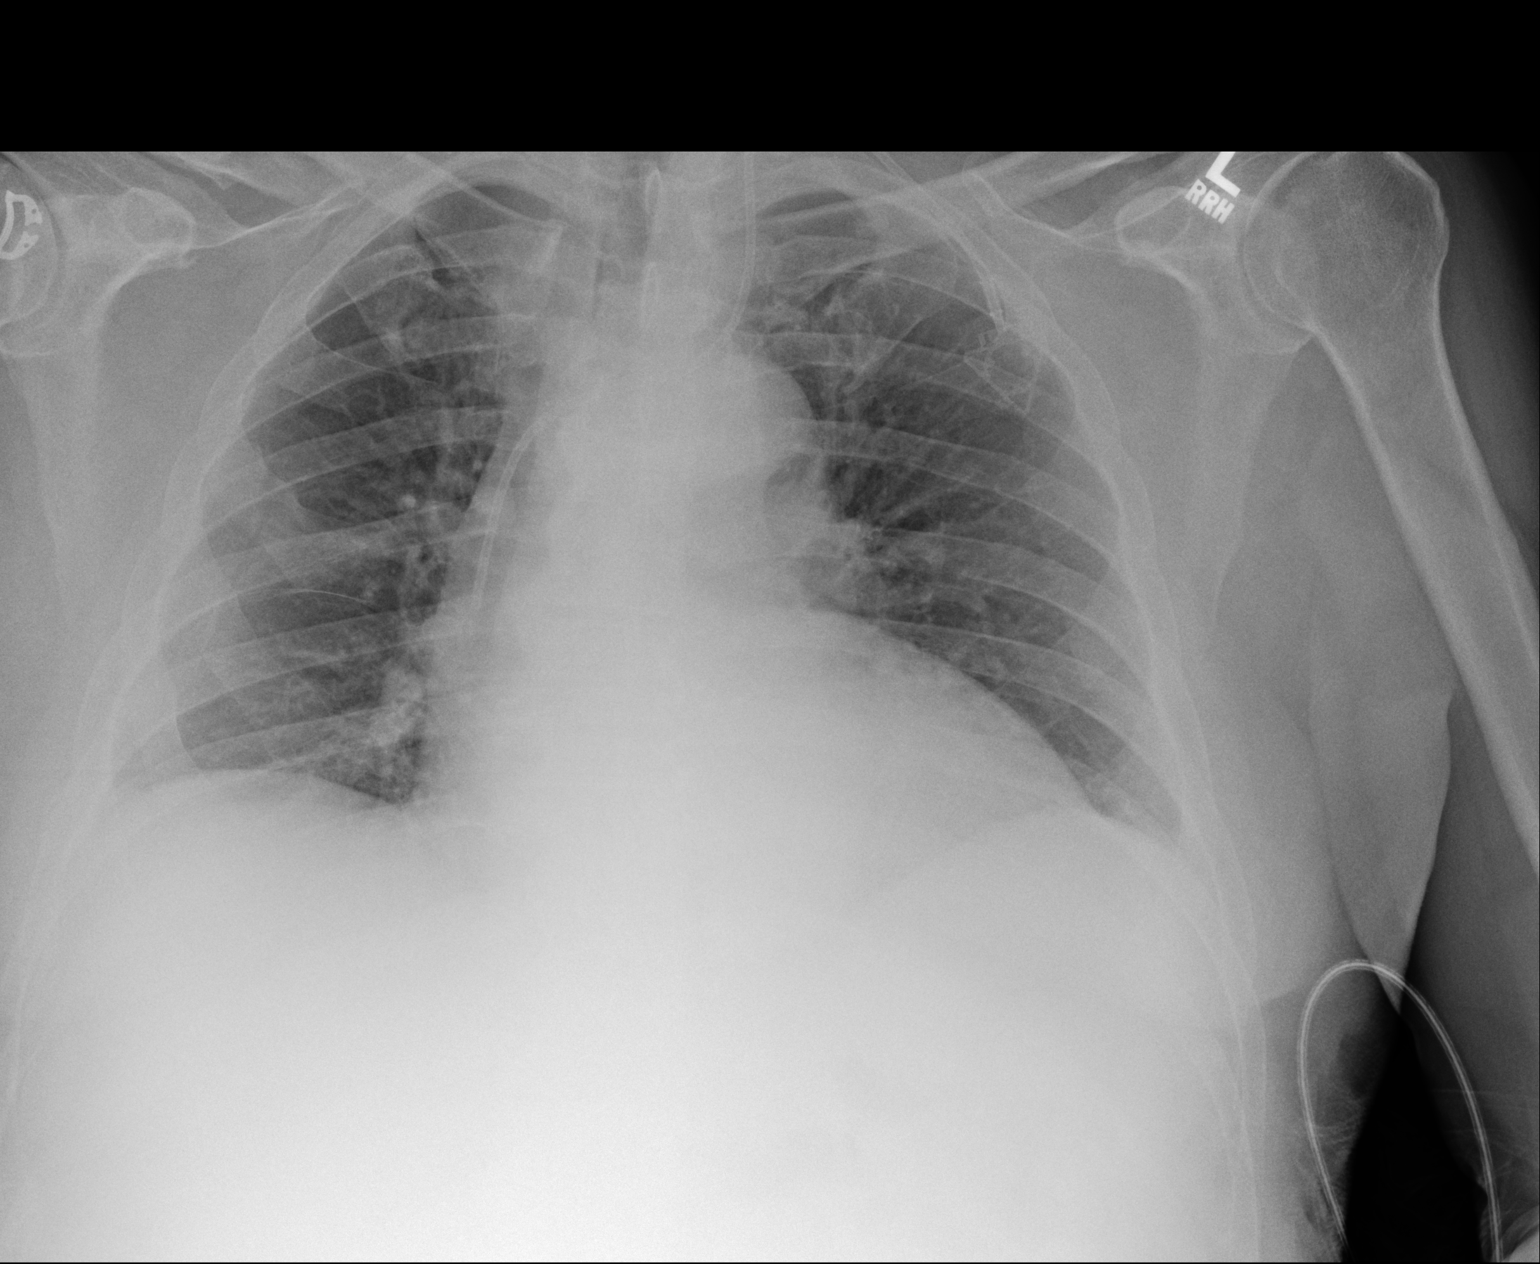

[1 of 1 positions shown; findings below may reference images not displayed]

FINDINGS: Port-A-Cath tip is in the superior vena cava. No pneumothorax. There
is no edema or consolidation. Heart is mildly enlarged with
pulmonary vascularity normal. No adenopathy. There is evidence of an
old healed fracture of the right clavicle. There is postoperative
change in the lower cervical region.
IMPRESSION: Port-A-Cath tip in superior vena cava. No pneumothorax. No edema or
consolidation. Stable cardiomegaly.

## 2020-01-22 ENCOUNTER — Ambulatory Visit (HOSPITAL_COMMUNITY): Payer: Medicare Other

## 2020-01-22 ENCOUNTER — Other Ambulatory Visit (HOSPITAL_COMMUNITY): Payer: Medicare Other

## 2020-01-23 ENCOUNTER — Inpatient Hospital Stay (HOSPITAL_COMMUNITY): Payer: Medicare Other

## 2020-01-23 ENCOUNTER — Other Ambulatory Visit: Payer: Self-pay

## 2020-01-23 ENCOUNTER — Encounter (HOSPITAL_COMMUNITY): Payer: Self-pay

## 2020-01-23 VITALS — BP 129/65 | HR 66 | Temp 96.9°F | Resp 18

## 2020-01-23 DIAGNOSIS — C9000 Multiple myeloma not having achieved remission: Secondary | ICD-10-CM

## 2020-01-23 DIAGNOSIS — C50922 Malignant neoplasm of unspecified site of left male breast: Secondary | ICD-10-CM | POA: Diagnosis not present

## 2020-01-23 DIAGNOSIS — D539 Nutritional anemia, unspecified: Secondary | ICD-10-CM | POA: Diagnosis not present

## 2020-01-23 DIAGNOSIS — C7951 Secondary malignant neoplasm of bone: Secondary | ICD-10-CM | POA: Diagnosis not present

## 2020-01-23 DIAGNOSIS — G629 Polyneuropathy, unspecified: Secondary | ICD-10-CM | POA: Diagnosis not present

## 2020-01-23 DIAGNOSIS — Z5111 Encounter for antineoplastic chemotherapy: Secondary | ICD-10-CM | POA: Diagnosis not present

## 2020-01-23 LAB — COMPREHENSIVE METABOLIC PANEL
ALT: 16 U/L (ref 0–44)
AST: 15 U/L (ref 15–41)
Albumin: 3.7 g/dL (ref 3.5–5.0)
Alkaline Phosphatase: 26 U/L — ABNORMAL LOW (ref 38–126)
Anion gap: 7 (ref 5–15)
BUN: 19 mg/dL (ref 8–23)
CO2: 25 mmol/L (ref 22–32)
Calcium: 8.9 mg/dL (ref 8.9–10.3)
Chloride: 106 mmol/L (ref 98–111)
Creatinine, Ser: 1.52 mg/dL — ABNORMAL HIGH (ref 0.61–1.24)
GFR calc Af Amer: 53 mL/min — ABNORMAL LOW (ref >60)
GFR calc non Af Amer: 45 mL/min — ABNORMAL LOW (ref >60)
Glucose, Bld: 97 mg/dL (ref 70–99)
Potassium: 4 mmol/L (ref 3.5–5.1)
Sodium: 138 mmol/L (ref 135–145)
Total Bilirubin: 0.6 mg/dL (ref 0.3–1.2)
Total Protein: 5.9 g/dL — ABNORMAL LOW (ref 6.5–8.1)

## 2020-01-23 LAB — CBC WITH DIFFERENTIAL/PLATELET
Abs Immature Granulocytes: 0.01 10*3/uL (ref 0.00–0.07)
Basophils Absolute: 0.1 10*3/uL (ref 0.0–0.1)
Basophils Relative: 1 %
Eosinophils Absolute: 0.2 10*3/uL (ref 0.0–0.5)
Eosinophils Relative: 4 %
HCT: 31.1 % — ABNORMAL LOW (ref 39.0–52.0)
Hemoglobin: 10.4 g/dL — ABNORMAL LOW (ref 13.0–17.0)
Immature Granulocytes: 0 %
Lymphocytes Relative: 19 %
Lymphs Abs: 1.2 10*3/uL (ref 0.7–4.0)
MCH: 36.6 pg — ABNORMAL HIGH (ref 26.0–34.0)
MCHC: 33.4 g/dL (ref 30.0–36.0)
MCV: 109.5 fL — ABNORMAL HIGH (ref 80.0–100.0)
Monocytes Absolute: 0.6 10*3/uL (ref 0.1–1.0)
Monocytes Relative: 10 %
Neutro Abs: 4.1 10*3/uL (ref 1.7–7.7)
Neutrophils Relative %: 66 %
Platelets: 225 10*3/uL (ref 150–400)
RBC: 2.84 MIL/uL — ABNORMAL LOW (ref 4.22–5.81)
RDW: 13.5 % (ref 11.5–15.5)
WBC: 6.2 10*3/uL (ref 4.0–10.5)
nRBC: 0 % (ref 0.0–0.2)

## 2020-01-23 LAB — LACTATE DEHYDROGENASE: LDH: 134 U/L (ref 98–192)

## 2020-01-23 MED ORDER — ACETAMINOPHEN 325 MG PO TABS
650.0000 mg | ORAL_TABLET | Freq: Once | ORAL | Status: AC
  Administered 2020-01-23: 650 mg via ORAL
  Filled 2020-01-23: qty 2

## 2020-01-23 MED ORDER — DEXTROSE 5 % IV SOLN
58.0000 mg/m2 | Freq: Once | INTRAVENOUS | Status: AC
  Administered 2020-01-23: 120 mg via INTRAVENOUS
  Filled 2020-01-23: qty 60

## 2020-01-23 MED ORDER — SODIUM CHLORIDE 0.9 % IV SOLN: Freq: Once | INTRAVENOUS | Status: AC

## 2020-01-23 MED ORDER — DENOSUMAB 120 MG/1.7ML ~~LOC~~ SOLN: 120.0000 mg | Freq: Once | SUBCUTANEOUS | Status: DC

## 2020-01-23 MED ORDER — HEPARIN SOD (PORK) LOCK FLUSH 100 UNIT/ML IV SOLN
500.0000 [IU] | Freq: Once | INTRAVENOUS | Status: AC | PRN
  Administered 2020-01-23: 500 [IU]

## 2020-01-23 MED ORDER — SODIUM CHLORIDE 0.9 % IV SOLN
20.0000 mg | Freq: Once | INTRAVENOUS | Status: AC
  Administered 2020-01-23: 20 mg via INTRAVENOUS
  Filled 2020-01-23: qty 20

## 2020-01-23 MED ORDER — SODIUM CHLORIDE 0.9% FLUSH
10.0000 mL | INTRAVENOUS | Status: DC | PRN
  Administered 2020-01-23: 10 mL

## 2020-01-23 NOTE — Progress Notes (Signed)
Labs reviewed today with Dr. Delton Coombes. Patient states he did ok with his last dose, states he got sick on his stomach but thinks it was from what he ate and got to hot outside. Creatinine 1.52 noted. Proceed as planned per MD.   Late entry  01-24-2020:Treatment given per orders. Patient tolerated it well without problems. Vitals stable and discharged home from clinic ambulatory. Follow up as scheduled.

## 2020-01-23 NOTE — Progress Notes (Signed)
01/23/20  Order to hold Xgeva pending biopsy by oral surgeon soon.  T.O. Dr Rhys Martini, PharmD

## 2020-01-24 LAB — PROTEIN ELECTROPHORESIS, SERUM
A/G Ratio: 1.8 — ABNORMAL HIGH (ref 0.7–1.7)
Albumin ELP: 3.7 g/dL (ref 2.9–4.4)
Alpha-1-Globulin: 0.2 g/dL (ref 0.0–0.4)
Alpha-2-Globulin: 0.6 g/dL (ref 0.4–1.0)
Beta Globulin: 0.8 g/dL (ref 0.7–1.3)
Gamma Globulin: 0.4 g/dL (ref 0.4–1.8)
Globulin, Total: 2.1 g/dL — ABNORMAL LOW (ref 2.2–3.9)
M-Spike, %: 0.3 g/dL — ABNORMAL HIGH
Total Protein ELP: 5.8 g/dL — ABNORMAL LOW (ref 6.0–8.5)

## 2020-01-24 LAB — KAPPA/LAMBDA LIGHT CHAINS
Kappa free light chain: 121.4 mg/L — ABNORMAL HIGH (ref 3.3–19.4)
Kappa, lambda light chain ratio: 43.36 — ABNORMAL HIGH (ref 0.26–1.65)
Lambda free light chains: 2.8 mg/L — ABNORMAL LOW (ref 5.7–26.3)

## 2020-01-25 DIAGNOSIS — D3709 Neoplasm of uncertain behavior of other specified sites of the oral cavity: Secondary | ICD-10-CM | POA: Diagnosis not present

## 2020-01-25 DIAGNOSIS — Z8579 Personal history of other malignant neoplasms of lymphoid, hematopoietic and related tissues: Secondary | ICD-10-CM | POA: Diagnosis not present

## 2020-01-25 DIAGNOSIS — C9000 Multiple myeloma not having achieved remission: Secondary | ICD-10-CM | POA: Diagnosis not present

## 2020-01-31 DIAGNOSIS — C091 Malignant neoplasm of tonsillar pillar (anterior) (posterior): Secondary | ICD-10-CM | POA: Diagnosis not present

## 2020-02-05 ENCOUNTER — Other Ambulatory Visit (HOSPITAL_COMMUNITY): Payer: Self-pay

## 2020-02-05 DIAGNOSIS — C9000 Multiple myeloma not having achieved remission: Secondary | ICD-10-CM

## 2020-02-05 DIAGNOSIS — D519 Vitamin B12 deficiency anemia, unspecified: Secondary | ICD-10-CM

## 2020-02-05 DIAGNOSIS — C7951 Secondary malignant neoplasm of bone: Secondary | ICD-10-CM

## 2020-02-06 ENCOUNTER — Encounter (HOSPITAL_COMMUNITY): Payer: Self-pay | Admitting: Hematology

## 2020-02-06 ENCOUNTER — Other Ambulatory Visit: Payer: Self-pay

## 2020-02-06 ENCOUNTER — Inpatient Hospital Stay (HOSPITAL_BASED_OUTPATIENT_CLINIC_OR_DEPARTMENT_OTHER): Payer: Medicare Other | Admitting: Hematology

## 2020-02-06 ENCOUNTER — Inpatient Hospital Stay (HOSPITAL_COMMUNITY): Payer: Medicare Other

## 2020-02-06 ENCOUNTER — Inpatient Hospital Stay (HOSPITAL_COMMUNITY): Payer: Medicare Other | Attending: Hematology

## 2020-02-06 VITALS — BP 133/71 | HR 66 | Temp 96.9°F | Resp 18

## 2020-02-06 VITALS — BP 124/74 | HR 64 | Temp 97.5°F | Resp 18 | Wt 218.2 lb

## 2020-02-06 DIAGNOSIS — D539 Nutritional anemia, unspecified: Secondary | ICD-10-CM | POA: Insufficient documentation

## 2020-02-06 DIAGNOSIS — Z7982 Long term (current) use of aspirin: Secondary | ICD-10-CM | POA: Diagnosis not present

## 2020-02-06 DIAGNOSIS — Z9221 Personal history of antineoplastic chemotherapy: Secondary | ICD-10-CM | POA: Diagnosis not present

## 2020-02-06 DIAGNOSIS — N189 Chronic kidney disease, unspecified: Secondary | ICD-10-CM | POA: Insufficient documentation

## 2020-02-06 DIAGNOSIS — C50922 Malignant neoplasm of unspecified site of left male breast: Secondary | ICD-10-CM | POA: Insufficient documentation

## 2020-02-06 DIAGNOSIS — I10 Essential (primary) hypertension: Secondary | ICD-10-CM | POA: Insufficient documentation

## 2020-02-06 DIAGNOSIS — C9000 Multiple myeloma not having achieved remission: Secondary | ICD-10-CM | POA: Insufficient documentation

## 2020-02-06 DIAGNOSIS — Z923 Personal history of irradiation: Secondary | ICD-10-CM | POA: Insufficient documentation

## 2020-02-06 DIAGNOSIS — Z170 Estrogen receptor positive status [ER+]: Secondary | ICD-10-CM | POA: Insufficient documentation

## 2020-02-06 DIAGNOSIS — C7951 Secondary malignant neoplasm of bone: Secondary | ICD-10-CM

## 2020-02-06 DIAGNOSIS — Z79810 Long term (current) use of selective estrogen receptor modulators (SERMs): Secondary | ICD-10-CM | POA: Diagnosis not present

## 2020-02-06 DIAGNOSIS — F419 Anxiety disorder, unspecified: Secondary | ICD-10-CM | POA: Diagnosis not present

## 2020-02-06 DIAGNOSIS — K219 Gastro-esophageal reflux disease without esophagitis: Secondary | ICD-10-CM | POA: Insufficient documentation

## 2020-02-06 DIAGNOSIS — G629 Polyneuropathy, unspecified: Secondary | ICD-10-CM | POA: Insufficient documentation

## 2020-02-06 DIAGNOSIS — Z79899 Other long term (current) drug therapy: Secondary | ICD-10-CM | POA: Diagnosis not present

## 2020-02-06 DIAGNOSIS — Z801 Family history of malignant neoplasm of trachea, bronchus and lung: Secondary | ICD-10-CM | POA: Diagnosis not present

## 2020-02-06 DIAGNOSIS — I129 Hypertensive chronic kidney disease with stage 1 through stage 4 chronic kidney disease, or unspecified chronic kidney disease: Secondary | ICD-10-CM | POA: Diagnosis not present

## 2020-02-06 DIAGNOSIS — D519 Vitamin B12 deficiency anemia, unspecified: Secondary | ICD-10-CM

## 2020-02-06 DIAGNOSIS — Z5111 Encounter for antineoplastic chemotherapy: Secondary | ICD-10-CM | POA: Diagnosis not present

## 2020-02-06 LAB — COMPREHENSIVE METABOLIC PANEL
ALT: 16 U/L (ref 0–44)
AST: 17 U/L (ref 15–41)
Albumin: 4 g/dL (ref 3.5–5.0)
Alkaline Phosphatase: 26 U/L — ABNORMAL LOW (ref 38–126)
Anion gap: 7 (ref 5–15)
BUN: 18 mg/dL (ref 8–23)
CO2: 27 mmol/L (ref 22–32)
Calcium: 9.4 mg/dL (ref 8.9–10.3)
Chloride: 106 mmol/L (ref 98–111)
Creatinine, Ser: 1.75 mg/dL — ABNORMAL HIGH (ref 0.61–1.24)
GFR calc Af Amer: 44 mL/min — ABNORMAL LOW (ref >60)
GFR calc non Af Amer: 38 mL/min — ABNORMAL LOW (ref >60)
Glucose, Bld: 98 mg/dL (ref 70–99)
Potassium: 4.1 mmol/L (ref 3.5–5.1)
Sodium: 140 mmol/L (ref 135–145)
Total Bilirubin: 0.5 mg/dL (ref 0.3–1.2)
Total Protein: 6.3 g/dL — ABNORMAL LOW (ref 6.5–8.1)

## 2020-02-06 LAB — CBC WITH DIFFERENTIAL/PLATELET
Abs Immature Granulocytes: 0.01 10*3/uL (ref 0.00–0.07)
Basophils Absolute: 0 10*3/uL (ref 0.0–0.1)
Basophils Relative: 1 %
Eosinophils Absolute: 0.2 10*3/uL (ref 0.0–0.5)
Eosinophils Relative: 3 %
HCT: 31.9 % — ABNORMAL LOW (ref 39.0–52.0)
Hemoglobin: 10.5 g/dL — ABNORMAL LOW (ref 13.0–17.0)
Immature Granulocytes: 0 %
Lymphocytes Relative: 21 %
Lymphs Abs: 1.3 10*3/uL (ref 0.7–4.0)
MCH: 36 pg — ABNORMAL HIGH (ref 26.0–34.0)
MCHC: 32.9 g/dL (ref 30.0–36.0)
MCV: 109.2 fL — ABNORMAL HIGH (ref 80.0–100.0)
Monocytes Absolute: 0.6 10*3/uL (ref 0.1–1.0)
Monocytes Relative: 10 %
Neutro Abs: 4 10*3/uL (ref 1.7–7.7)
Neutrophils Relative %: 65 %
Platelets: 247 10*3/uL (ref 150–400)
RBC: 2.92 MIL/uL — ABNORMAL LOW (ref 4.22–5.81)
RDW: 13.3 % (ref 11.5–15.5)
WBC: 6.2 10*3/uL (ref 4.0–10.5)
nRBC: 0 % (ref 0.0–0.2)

## 2020-02-06 LAB — LACTATE DEHYDROGENASE: LDH: 132 U/L (ref 98–192)

## 2020-02-06 MED ORDER — SODIUM CHLORIDE 0.9 % IV SOLN
20.0000 mg | Freq: Once | INTRAVENOUS | Status: AC
  Administered 2020-02-06: 20 mg via INTRAVENOUS
  Filled 2020-02-06: qty 2

## 2020-02-06 MED ORDER — ACETAMINOPHEN 325 MG PO TABS
650.0000 mg | ORAL_TABLET | Freq: Once | ORAL | Status: AC
  Administered 2020-02-06: 650 mg via ORAL
  Filled 2020-02-06: qty 2

## 2020-02-06 MED ORDER — DEXTROSE 5 % IV SOLN
58.0000 mg/m2 | Freq: Once | INTRAVENOUS | Status: AC
  Administered 2020-02-06: 120 mg via INTRAVENOUS
  Filled 2020-02-06: qty 60

## 2020-02-06 MED ORDER — SODIUM CHLORIDE 0.9 % IV SOLN: Freq: Once | INTRAVENOUS | Status: AC

## 2020-02-06 MED ORDER — HEPARIN SOD (PORK) LOCK FLUSH 100 UNIT/ML IV SOLN
500.0000 [IU] | Freq: Once | INTRAVENOUS | Status: AC | PRN
  Administered 2020-02-06: 500 [IU]

## 2020-02-06 MED ORDER — SODIUM CHLORIDE 0.9% FLUSH
10.0000 mL | INTRAVENOUS | Status: DC | PRN
  Administered 2020-02-06: 10 mL

## 2020-02-06 NOTE — Assessment & Plan Note (Signed)
1.  Stage II IgG kappa multiple myeloma (Dx February 2018): -RVD from 03/01/2017 through 06/29/2017 -Chemotherapy changed to 3 cycles of CCyD from 07/12/2017-04/2017 to obtain deeper response - Status post auto stem cell transplant on 10/08/2017 -Stable M spike after transplant, treated with 2 cycles of consolidation with CCyD on 02/14/2018 and 03/14/2018.  -His M spike became undetectable after consolidation.  He was placed on carfilzomib 70 mg/m2 maintenance every 2 weeks on 04/20/2018.  He had admission with fever and chills and required 1 unit of blood transfusion. - Subsequently he tolerated carfilzomib and smaller doses very well.  We have increased his doses to 60 mg/m on 09/01/2018 and he tolerated 4 doses very well with the addition of dexamethasone. - Carfilzomib dose was increased to 70 mg per metered square on 10/27/2018.   -He was hospitalized on 11/29/2018 through 12/05/2018 with bronchitis, positive for parainfluenza, acute renal failure and severe thrombocytopenia.  His platelet count went down to 9.  We have given IVIG 1 g/kg x 2 doses.  Platelet count has slowly improved.  Review of literature showed carfilzomib can cause dose-related microangiopathy. -Carfilzomib restarted at reduced dose of 35 mg/m2 on 01/05/2019. -As his M spike was trending up, we have increased his carfilzomib to 56 mg per metered square on 01/09/2020.  He received second dose on 01/23/2020 and tolerated it well. -We reviewed labs from 01/23/2020.  M spike further increased to 0.3 g.  Free light chain ratio has jumped up from 16.69-43.36.  Kappa light chains are 121.  Lambda light chain is 2.8.  Creatinine is 1.75. -I have recommended restaging with a PET CT scan.  We will also consider bone marrow biopsy.  I have recommended follow-up visit with Dr. Norma Fredrickson at [facility]. -I will see him back after the PET scan.  2.  Metastatic left breast cancer to bones: -Diagnosed in 2011, continue tamoxifen.  3.  Peripheral  neuropathy: -Continue gabapentin 300 mg twice daily.  4.  ID prophylaxis: -Continue acyclovir 400 mg twice daily.  5.  Myeloma bone disease: -He was on denosumab.  The last dose was on 12/25/2019.  He had some kind of biopsy in the upper jaw. -We will continue to hold it until clearance from the oral surgeon.  6.  Macrocytic anemia: -Etiology CKD and myeloma treatments. -He is receiving Procrit if his hemoglobin drops below 9.  Today hemoglobin is 10.5.

## 2020-02-06 NOTE — Progress Notes (Signed)
Patient presents today for treatment and follow up visit with Dr. Delton Coombes. Vital signs within parameters for treatment. MAR reviewed. Creatinine 1.75 today. Labs reviewed by Dr. Delton Coombes. No new orders. Message received from [facility] LPN/ Dr. Delton Coombes. Proceed with treatment. Hold XGeva today due to recent dental work and biopsy.   Treatment given today per MD orders. Tolerated infusion without adverse affects. Vital signs stable. No complaints at this time. Discharged from clinic ambulatory. F/U with [facility] as scheduled.

## 2020-02-06 NOTE — Patient Instructions (Addendum)
[place] at [facility] Discharge Instructions  You were seen today by Dr. Delton Coombes. He went over your recent lab results. Your M-spike increased slightly, but it doesn't mean a lot due to [place] just increasing the dose of your treatment. He will schedule you for a repeat PET scan, and a repeat bone marrow biopsy. He will send you back to [place]. He will see you back in 4 weeks for labs and follow up.   Thank you for choosing [place] at [facility] to provide your oncology and hematology care.  To afford each patient quality time with our provider, please arrive at least 15 minutes before your scheduled appointment time.   If you have a lab appointment with the [facility] please come in thru the  Main Entrance and check in at the main information desk  You need to re-schedule your appointment should you arrive 10 or more minutes late.  We strive to give you quality time with our providers, and arriving late affects you and other patients whose appointments are after yours.  Also, if you no show three or more times for appointments you may be dismissed from the clinic at the providers discretion.     Again, thank you for choosing [facility].  Our hope is that these requests will decrease the amount of time that you wait before being seen by our physicians.       _____________________________________________________________  Should you have questions after your visit to [facility], please contact our office at [phone] between the hours of 8:00 a.m. and 4:30 p.m.  Voicemails left after 4:00 p.m. will not be returned until the following business day.  For prescription refill requests, have your pharmacy contact our office and allow 72 hours.    Cancer Center Support Programs:   > Cancer Support Group  2nd Tuesday of the month 1pm-2pm, Journey Room

## 2020-02-06 NOTE — Patient Instructions (Signed)
[facility] Discharge Instructions for Patients Receiving Chemotherapy  Today you received the following chemotherapy agents   To help prevent nausea and vomiting after your treatment, we encourage you to take your nausea medication   If you develop nausea and vomiting that is not controlled by your nausea medication, call the clinic.   BELOW ARE SYMPTOMS THAT SHOULD BE REPORTED IMMEDIATELY:  *FEVER GREATER THAN 100.5 F  *CHILLS WITH OR WITHOUT FEVER  NAUSEA AND VOMITING THAT IS NOT CONTROLLED WITH YOUR NAUSEA MEDICATION  *UNUSUAL SHORTNESS OF BREATH  *UNUSUAL BRUISING OR BLEEDING  TENDERNESS IN MOUTH AND THROAT WITH OR WITHOUT PRESENCE OF ULCERS  *URINARY PROBLEMS  *BOWEL PROBLEMS  UNUSUAL RASH Items with * indicate a potential emergency and should be followed up as soon as possible.  Feel free to call the clinic should you have any questions or concerns. The clinic phone number is [phone].  Please show the CHEMO ALERT CARD at check-in to the Emergency Department and triage nurse.   

## 2020-02-06 NOTE — Progress Notes (Signed)
Patient has been assessed, vital signs and labs have been reviewed by Dr. Delton Coombes. ANC, Creatinine, LFTs, and Platelets are within treatment parameters per Dr. Delton Coombes. The patient is good to proceed with treatment at this time. HOLD bone injection due to recent dental work and biopsy, per Dr. Delton Coombes.

## 2020-02-06 NOTE — Progress Notes (Signed)
[place], [place] [address]   CLINIC:  Medical Oncology/Hematology  PCP:  [name], MD [place] [id number] [phone]   REASON FOR VISIT:  Follow-up for Multiple Myeloma  CURRENT THERAPY: Carfilzomib   BRIEF ONCOLOGIC HISTORY:  Oncology History  Breast cancer, male ([place])  12/31/2009 Initial Biopsy   Biopsy of L breast    12/31/2009 Pathology Results   Invasive ductal carcinoma, ER/PR+, HER 2 negative   12/31/2009 Imaging   Ultrasound showing a 2.43 x 1.85 x 3 cm hypoechoic spiculated mass in the 12 o clock L breast retroareolar region   01/01/2010 -  Anti-estrogen oral therapy   Tamoxifen 20 mg daily   01/06/2010 Imaging   Bone scan abnormal uptake in the diaphysis of the R humerus, abnormal in the R third, fifth and sixth ribs, lesion also noted in the sternum.   02/03/2010 Surgery   Rod placement and fixation of R humerus by Dr. [name]   02/05/2010 - 02/18/2010 Radiation Therapy   30Gy in 10 fractions of 3 Gy per fraction to R pathologic fracture   03/11/2010 -  Chemotherapy   Denosumab monthly, now every 3 months. Started at [facility]    06/09/2016 Imaging   Three hypermetabolic osseous lesions in the sternum, left ilium and right ilium, as discussed above, likely represent osseous metastases. At this time, these are not recognizable on the CT images. 2. No extra skeletal metastatic disease identified in the neck, chest, abdomen or pelvis.   10/13/2016 Progression   PET shows various new and enlarging osseous metastatic lesions with no definite extra osseous metastatic disease currently identified.    12/31/2016 Progression   1. Multifocal hypermetabolic osseous metastases throughout the axial and proximal appendicular skeleton, which are increased in size, number and metabolism since 10/13/2016 PET-CT. 2. 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 3. No additional sites of hypermetabolic metastatic disease. 4. Chronic right mastoid sinusitis. 5. Aortic atherosclerosis.  One vessel coronary atherosclerosis.   Multiple myeloma ([place])  02/12/2017 Bone Marrow Biopsy   The marrow was variably cellular with large peritrabecular aggregates of kappa restricted plasma cells (66% by aspirate, 30% by Cd138). Cytogenetics +11.    03/01/2017 - 06/29/2017 Chemotherapy   RVD    05/26/2017 Bone Marrow Biopsy   Performed at [facility]:  Plasma cell myeloma in a 30% cellularmarrow with decreased trilineage hematopoiesis and 42% kappalight chain restricted plasma cells on the aspirate smears andlarge aggregates on the core biopsy.    07/12/2017 - 09/01/2017 Chemotherapy   3 cycles of carfizolmib/cyclophosphamide/dexamethasone     10/08/2017 Bone Marrow Transplant   Autotransplant at [facility]      CANCER STAGING: Cancer Staging Multiple myeloma [facility]) Staging form: Plasma Cell Myeloma and Plasma Cell Disorders, AJCC 8th Edition - Clinical stage from 05/03/2017: Beta-2-microglobulin (mg/L): 3.5, Albumin (g/dL): 3.4, ISS: Stage II, High-risk cytogenetics: Absent, LDH: Not assessed - Signed by Baird Cancer, PA-C on 05/03/2017    INTERVAL HISTORY:  [name] 74 y.o. male seen for follow-up of multiple myeloma, breast cancer and anemia.  Does not report any new onset pains.  Appetite is 100%.  Energy levels are 75%.  Tolerated increased dose of carfilzomib very well.  Numbness in the feet has been stable.  Denies any fluid retention or signs of PND or orthopnea.  REVIEW OF  SYSTEMS:  Review of Systems  Neurological: Positive for numbness.  All other systems reviewed and are negative.    PAST MEDICAL/SURGICAL HISTORY:  Past Medical History:  Diagnosis Date  . Anxiety   . Bone metastases ([place]) 09/10/2016  . Breast cancer ([place]) 2011   Stave IV breast cancer;  radiation and tamoxifen  . Breast cancer, male ([place])    Stave IV breast cancer; radiation and tamoxifen  Overview:  Left breast ca with mets bone  Overview:  METS TO BONE  . GERD (gastroesophageal reflux disease)   . Hypertension   . Macular degeneration   . Multiple myeloma ([place]) 02/18/2017  . Peripheral neuropathy    Past Surgical History:  Procedure Laterality Date  . BACK SURGERY    . CATARACT EXTRACTION W/PHACO Left 02/03/2019   Procedure: CATARACT EXTRACTION PHACO AND INTRAOCULAR LENS PLACEMENT ([place]);  Surgeon: Baruch Goldmann, MD;  Location: [facility];  Service: Ophthalmology;  Laterality: Left;  CDE: 15.89  . HERNIA REPAIR    . PORTACATH PLACEMENT Left 06/10/2018   Procedure: INSERTION PORT-A-CATH;  Surgeon: Virl Cagey, MD;  Location: [facility];  Service: General;  Laterality: Left;  . right arm surgery       SOCIAL HISTORY:  Social History   Socioeconomic History  . Marital status: Married    Spouse name: Not on file  . Number of children: 3  . Years of education: Not on file  . Highest education level: Not on file  Occupational History  . Not on file  Tobacco Use  . Smoking status: Never Smoker  . Smokeless tobacco: Former Network engineer and Sexual Activity  . Alcohol use: Yes    Alcohol/week: 24.0 standard drinks    Types: 24 Cans of beer per week  . Drug use: No  . Sexual activity: Not on file  Other Topics Concern  . Not on file  Social History Narrative  . Not on file   Social Determinants of Health   Financial Resource Strain:   . Difficulty of Paying Living Expenses: Not on file  Food Insecurity:   . Worried About Charity fundraiser in the Last Year: Not on file  . Ran Out of Food in the Last Year: Not on file  Transportation Needs:   . Lack of Transportation (Medical): Not on file  . Lack of Transportation (Non-Medical): Not on file  Physical Activity:   . Days of Exercise per Week: Not on file  . Minutes of Exercise per Session: Not on file    Stress:   . Feeling of Stress : Not on file  Social Connections:   . Frequency of Communication with Friends and Family: Not on file  . Frequency of Social Gatherings with Friends and Family: Not on file  . Attends Religious Services: Not on file  . Active Member of Clubs or Organizations: Not on file  . Attends Archivist Meetings: Not on file  . Marital Status: Not on file  Intimate Partner Violence:   . Fear of Current or Ex-Partner: Not on file  . Emotionally Abused: Not on file  . Physically Abused: Not on file  . Sexually Abused: Not on file    FAMILY HISTORY:  Family History  Problem Relation Age of Onset  . Stroke Mother   . Cancer Maternal Aunt        cancer NOS; died in her 1s  . Lung cancer Maternal Uncle        smoker  CURRENT MEDICATIONS:  Outpatient Encounter Medications as of 02/06/2020  Medication Sig Note  . acyclovir (ZOVIRAX) 800 MG tablet TAKE 1 TABLET TWICE DAILY   . amLODipine (NORVASC) 5 MG tablet Take 1 tablet (5 mg total) by mouth daily.   Marland Kitchen aspirin EC 81 MG tablet Take 81 mg by mouth daily.   Marland Kitchen augmented betamethasone dipropionate (DIPROLENE-AF) 0.05 % cream Apply topically as needed.    . calcium-vitamin D (OSCAL WITH D) 500-200 MG-UNIT TABS tablet Take 1 tablet by mouth 2 (two) times daily.   Marland Kitchen CARFILZOMIB IV Inject into the vein.   . Cholecalciferol (VITAMIN D) 50 MCG (2000 UT) CAPS Take 4,000 Units by mouth 2 (two) times daily.   Marland Kitchen denosumab (XGEVA) 120 MG/1.7ML SOLN injection Inject 120 mg into the skin once. 01/18/2019: [place]   . folic acid (FOLVITE) 1 MG tablet TAKE 1 TABLET BY MOUTH DAILY   . gabapentin (NEURONTIN) 300 MG capsule Take 1 capsule (300 mg total) by mouth 2 (two) times daily.   . hydrALAZINE (APRESOLINE) 50 MG tablet Take 1 tablet (50 mg total) by mouth every 8 (eight) hours. (Patient taking differently: Take 50 mg by mouth 2 (two) times daily. )   . loratadine (CLARITIN) 10 MG tablet Take 1 tablet (10 mg  total) by mouth daily.   . metoprolol succinate (TOPROL-XL) 100 MG 24 hr tablet Take 100 mg by mouth daily.    . Multiple Vitamin (MULTIVITAMIN WITH MINERALS) TABS tablet Take 1 tablet by mouth daily.   . pantoprazole (PROTONIX) 40 MG tablet Take 40 mg by mouth every other day.    . tamoxifen (NOLVADEX) 20 MG tablet TAKE 1 TABLET (20 MG TOTAL) BY MOUTH DAILY.   . vitamin B-12 (CYANOCOBALAMIN) 1000 MCG tablet Take 1,000 mcg by mouth daily.   Marland Kitchen ALPRAZolam (XANAX) 0.5 MG tablet TAKE 1 TABLET BY MOUTH AT BEDTIME AS NEEDED FOR ANXIETY OR SLEEP (Patient not taking: Reported on 01/09/2020)   . diclofenac Sodium (VOLTAREN) 1 % GEL Use 1 to 2 times per day as directed by doctor   . ondansetron (ZOFRAN) 8 MG tablet TAKE ONE TABLET BY MOUTH EVERY 8 HOURS AS NEEDED (Patient not taking: Reported on 01/09/2020)   . polyethylene glycol (MIRALAX / GLYCOLAX) packet Take 17 g by mouth daily as needed for mild constipation. (Patient not taking: Reported on 01/09/2020)   . prochlorperazine (COMPAZINE) 10 MG tablet Take 1 tablet (10 mg total) by mouth every 6 (six) hours as needed for nausea or vomiting. (Patient not taking: Reported on 01/09/2020)   . pseudoephedrine-guaifenesin (MUCINEX D) 60-600 MG 12 hr tablet Take 1 tablet by mouth 2 (two) times daily as needed for congestion.    . [DISCONTINUED] ALPRAZolam (XANAX) 0.5 MG tablet TAKE 1 TABLET BY MOUTH AT BEDTIME AS NEEDED FOR ANXIETY OR SLEEP    Facility-Administered Encounter Medications as of 02/06/2020  Medication  . heparin lock flush 100 unit/mL  . sodium chloride flush (NS) 0.9 % injection 10 mL  . sodium chloride flush (NS) 0.9 % injection 10 mL    ALLERGIES:  Allergies  Allergen Reactions  . Cefepime     Suspected severe thrombocytopenia is a result of cefepime induced antigen platelet destruction     PHYSICAL EXAM:  ECOG Performance status: 1  Vitals:   02/06/20 1217  BP: 124/74  Pulse: 64  Resp: 18  Temp: (!) 97.5 F (36.4 C)  SpO2: 99%    Filed Weights   02/06/20 1217  Weight: 218 lb 3.2 oz (99 kg)    Physical Exam Constitutional:      Appearance: Normal appearance.  HENT:     Head: Normocephalic.     Nose: Nose normal.     Mouth/Throat:     Mouth: Mucous membranes are moist.     Pharynx: Oropharynx is clear.  Eyes:     Extraocular Movements: Extraocular movements intact.     Conjunctiva/sclera: Conjunctivae normal.  Cardiovascular:     Rate and Rhythm: Normal rate and regular rhythm.     Pulses: Normal pulses.     Heart sounds: Normal heart sounds.  Pulmonary:     Effort: Pulmonary effort is normal.     Breath sounds: Normal breath sounds.  Abdominal:     General: Bowel sounds are normal.     Palpations: Abdomen is soft.  Musculoskeletal:        General: Normal range of motion.     Cervical back: Normal range of motion.  Skin:    General: Skin is warm and dry.  Neurological:     General: No focal deficit present.     Mental Status: He is alert and oriented to person, place, and time. Mental status is at baseline.  Psychiatric:        Mood and Affect: Mood normal.        Behavior: Behavior normal.        Thought Content: Thought content normal.        Judgment: Judgment normal.      LABORATORY DATA:  I have reviewed the labs as listed.  CBC    Component Value Date/Time   WBC 6.2 02/06/2020 1035   RBC 2.92 (L) 02/06/2020 1035   HGB 10.5 (L) 02/06/2020 1035   HCT 31.9 (L) 02/06/2020 1035   PLT 247 02/06/2020 1035   MCV 109.2 (H) 02/06/2020 1035   MCH 36.0 (H) 02/06/2020 1035   MCHC 32.9 02/06/2020 1035   RDW 13.3 02/06/2020 1035   LYMPHSABS 1.3 02/06/2020 1035   MONOABS 0.6 02/06/2020 1035   EOSABS 0.2 02/06/2020 1035   BASOSABS 0.0 02/06/2020 1035   CMP Latest Ref Rng & Units 02/06/2020 01/23/2020 01/09/2020  Glucose 70 - 99 mg/dL 98 97 103(H)  BUN 8 - 23 mg/dL _0 Creatinine 0.61 - 1.24 mg/dL 1.75(H) 1.52(H) 1.65(H)  Sodium 135 - 145 mmol/L 140 138 139  Potassium 3.5 - 5.1  mmol/L 4.1 4.0 3.9  Chloride 98 - 111 mmol/L 106 106 107  CO2 22 - 32 mmol/L _1 Calcium 8.9 - 10.3 mg/dL 9.4 8.9 9.0  Total Protein 6.5 - 8.1 g/dL 6.3(L) 5.9(L) 5.9(L)  Total Bilirubin 0.3 - 1.2 mg/dL 0.5 0.6 0.4  Alkaline Phos 38 - 126 U/L 26(L) 26(L) 26(L)  AST 15 - 41 U/L _2 ALT 0 - 44 U/L _3 Radiology: I have reviewed his previous scans.  ASSESSMENT & PLAN:   Multiple myeloma ([place]) 1.  Stage II IgG kappa multiple myeloma (Dx February 2018): -RVD from 03/01/2017 through 06/29/2017 -Chemotherapy changed to 3 cycles of CCyD from 07/12/2017-04/2017 to obtain deeper response - Status post auto stem cell transplant on 10/08/2017 -Stable M spike after transplant, treated with 2 cycles of consolidation with CCyD on 02/14/2018 and 03/14/2018.  -His M spike became undetectable after consolidation.  He was placed on carfilzomib 70 mg/m2 maintenance every 2 weeks on 04/20/2018.  He had admission with fever and chills  and required 1 unit of blood transfusion. - Subsequently he tolerated carfilzomib and smaller doses very well.  We have increased his doses to 60 mg/m on 09/01/2018 and he tolerated 4 doses very well with the addition of dexamethasone. - Carfilzomib dose was increased to 70 mg per metered square on 10/27/2018.   -He was hospitalized on 11/29/2018 through 12/05/2018 with bronchitis, positive for parainfluenza, acute renal failure and severe thrombocytopenia.  His platelet count went down to 9.  We have given IVIG 1 g/kg x 2 doses.  Platelet count has slowly improved.  Review of literature showed carfilzomib can cause dose-related microangiopathy. -Carfilzomib restarted at reduced dose of 35 mg/m2 on 01/05/2019. -As his M spike was trending up, we have increased his carfilzomib to 56 mg per metered square on 01/09/2020.  He received second dose on 01/23/2020 and tolerated it well. -We reviewed labs from 01/23/2020.  M spike further increased to 0.3 g.  Free light chain ratio has  jumped up from 16.69-43.36.  Kappa light chains are 121.  Lambda light chain is 2.8.  Creatinine is 1.75. -I have recommended restaging with a PET CT scan.  We will also consider bone marrow biopsy.  I have recommended follow-up visit with Dr. Norma Fredrickson at [facility]. -I will see him back after the PET scan.  2.  Metastatic left breast cancer to bones: -Diagnosed in 2011, continue tamoxifen.  3.  Peripheral neuropathy: -Continue gabapentin 300 mg twice daily.  4.  ID prophylaxis: -Continue acyclovir 400 mg twice daily.  5.  Myeloma bone disease: -He was on denosumab.  The last dose was on 12/25/2019.  He had some kind of biopsy in the upper jaw. -We will continue to hold it until clearance from the oral surgeon.  6.  Macrocytic anemia: -Etiology CKD and myeloma treatments. -He is receiving Procrit if his hemoglobin drops below 9.  Today hemoglobin is 10.5.     Orders placed this encounter:  Orders Placed This Encounter  Procedures  . NM PET Image Restag (PS) Skull Base To Thigh      Derek Jack, MD [place] [phone]

## 2020-02-07 ENCOUNTER — Other Ambulatory Visit (HOSPITAL_COMMUNITY): Payer: Self-pay | Admitting: *Deleted

## 2020-02-07 DIAGNOSIS — C9000 Multiple myeloma not having achieved remission: Secondary | ICD-10-CM

## 2020-02-07 LAB — PROTEIN ELECTROPHORESIS, SERUM
A/G Ratio: 1.9 — ABNORMAL HIGH (ref 0.7–1.7)
Albumin ELP: 3.7 g/dL (ref 2.9–4.4)
Alpha-1-Globulin: 0.2 g/dL (ref 0.0–0.4)
Alpha-2-Globulin: 0.6 g/dL (ref 0.4–1.0)
Beta Globulin: 0.8 g/dL (ref 0.7–1.3)
Gamma Globulin: 0.4 g/dL (ref 0.4–1.8)
Globulin, Total: 2 g/dL — ABNORMAL LOW (ref 2.2–3.9)
M-Spike, %: 0.3 g/dL — ABNORMAL HIGH
Total Protein ELP: 5.7 g/dL — ABNORMAL LOW (ref 6.0–8.5)

## 2020-02-07 LAB — KAPPA/LAMBDA LIGHT CHAINS
Kappa free light chain: 123.1 mg/L — ABNORMAL HIGH (ref 3.3–19.4)
Kappa, lambda light chain ratio: 38.47 — ABNORMAL HIGH (ref 0.26–1.65)
Lambda free light chains: 3.2 mg/L — ABNORMAL LOW (ref 5.7–26.3)

## 2020-02-12 ENCOUNTER — Other Ambulatory Visit: Payer: Self-pay

## 2020-02-12 ENCOUNTER — Encounter (HOSPITAL_COMMUNITY)
Admission: RE | Admit: 2020-02-12 | Discharge: 2020-02-12 | Disposition: A | Discharge status: Home / Self Care | Payer: Medicare Other | Source: Ambulatory Visit | Attending: Hematology | Admitting: Hematology

## 2020-02-12 DIAGNOSIS — C9000 Multiple myeloma not having achieved remission: Secondary | ICD-10-CM | POA: Insufficient documentation

## 2020-02-12 DIAGNOSIS — C7951 Secondary malignant neoplasm of bone: Secondary | ICD-10-CM | POA: Diagnosis not present

## 2020-02-12 MED ORDER — FLUDEOXYGLUCOSE F - 18 (FDG) INJECTION
12.5000 | Freq: Once | INTRAVENOUS | Status: AC | PRN
  Administered 2020-02-12: 12.5 via INTRAVENOUS

## 2020-02-13 ENCOUNTER — Other Ambulatory Visit (HOSPITAL_COMMUNITY): Payer: Self-pay | Admitting: *Deleted

## 2020-02-13 DIAGNOSIS — C9000 Multiple myeloma not having achieved remission: Secondary | ICD-10-CM

## 2020-02-13 MED ORDER — ALPRAZOLAM 0.5 MG PO TABS: ORAL_TABLET | ORAL | 3 refills | Status: DC

## 2020-02-14 DIAGNOSIS — Z9484 Stem cells transplant status: Secondary | ICD-10-CM | POA: Diagnosis not present

## 2020-02-14 DIAGNOSIS — C9000 Multiple myeloma not having achieved remission: Secondary | ICD-10-CM | POA: Diagnosis not present

## 2020-02-14 DIAGNOSIS — Z853 Personal history of malignant neoplasm of breast: Secondary | ICD-10-CM | POA: Diagnosis not present

## 2020-02-14 DIAGNOSIS — C9002 Multiple myeloma in relapse: Secondary | ICD-10-CM | POA: Diagnosis not present

## 2020-02-14 DIAGNOSIS — D801 Nonfamilial hypogammaglobulinemia: Secondary | ICD-10-CM | POA: Diagnosis not present

## 2020-02-14 DIAGNOSIS — G629 Polyneuropathy, unspecified: Secondary | ICD-10-CM | POA: Diagnosis not present

## 2020-02-14 DIAGNOSIS — N189 Chronic kidney disease, unspecified: Secondary | ICD-10-CM | POA: Diagnosis not present

## 2020-02-14 DIAGNOSIS — D6489 Other specified anemias: Secondary | ICD-10-CM | POA: Diagnosis not present

## 2020-02-14 DIAGNOSIS — K1370 Unspecified lesions of oral mucosa: Secondary | ICD-10-CM | POA: Diagnosis not present

## 2020-02-14 DIAGNOSIS — Z79810 Long term (current) use of selective estrogen receptor modulators (SERMs): Secondary | ICD-10-CM | POA: Diagnosis not present

## 2020-02-14 DIAGNOSIS — D539 Nutritional anemia, unspecified: Secondary | ICD-10-CM | POA: Diagnosis not present

## 2020-02-14 DIAGNOSIS — D649 Anemia, unspecified: Secondary | ICD-10-CM | POA: Diagnosis not present

## 2020-02-14 DIAGNOSIS — C787 Secondary malignant neoplasm of liver and intrahepatic bile duct: Secondary | ICD-10-CM | POA: Diagnosis not present

## 2020-02-14 DIAGNOSIS — Z79899 Other long term (current) drug therapy: Secondary | ICD-10-CM | POA: Diagnosis not present

## 2020-02-14 DIAGNOSIS — D531 Other megaloblastic anemias, not elsewhere classified: Secondary | ICD-10-CM | POA: Diagnosis not present

## 2020-02-20 ENCOUNTER — Ambulatory Visit (HOSPITAL_COMMUNITY): Payer: Medicare Other

## 2020-02-20 ENCOUNTER — Encounter (HOSPITAL_COMMUNITY): Payer: Medicare Other | Admitting: Hematology

## 2020-02-20 ENCOUNTER — Other Ambulatory Visit: Payer: Self-pay

## 2020-02-20 ENCOUNTER — Encounter (HOSPITAL_COMMUNITY): Payer: Self-pay | Admitting: Hematology

## 2020-02-20 ENCOUNTER — Other Ambulatory Visit (HOSPITAL_COMMUNITY): Payer: Medicare Other

## 2020-02-20 ENCOUNTER — Inpatient Hospital Stay (HOSPITAL_BASED_OUTPATIENT_CLINIC_OR_DEPARTMENT_OTHER): Payer: Medicare Other | Admitting: Hematology

## 2020-02-20 VITALS — BP 132/64 | HR 73 | Temp 97.8°F | Resp 18 | Wt 219.0 lb

## 2020-02-20 DIAGNOSIS — C9000 Multiple myeloma not having achieved remission: Secondary | ICD-10-CM | POA: Diagnosis not present

## 2020-02-20 DIAGNOSIS — C50922 Malignant neoplasm of unspecified site of left male breast: Secondary | ICD-10-CM | POA: Diagnosis not present

## 2020-02-20 DIAGNOSIS — Z5111 Encounter for antineoplastic chemotherapy: Secondary | ICD-10-CM | POA: Diagnosis not present

## 2020-02-20 DIAGNOSIS — Z170 Estrogen receptor positive status [ER+]: Secondary | ICD-10-CM | POA: Diagnosis not present

## 2020-02-20 DIAGNOSIS — Z79810 Long term (current) use of selective estrogen receptor modulators (SERMs): Secondary | ICD-10-CM | POA: Diagnosis not present

## 2020-02-20 DIAGNOSIS — N189 Chronic kidney disease, unspecified: Secondary | ICD-10-CM | POA: Diagnosis not present

## 2020-02-20 MED ORDER — DEXAMETHASONE 4 MG PO TABS: 20.0000 mg | ORAL_TABLET | ORAL | 1 refills | Status: DC

## 2020-02-20 NOTE — Assessment & Plan Note (Signed)
1.  Relapsed multiple myeloma: -Most recent progression on carfilzomib. -PET scan on 02/13/2020 showed left supraclavicular lymph node at the thoracic inlet.  Multifocal bone lesions largely improved from PET scan from 2018.  However there are residual lesions in the right acromion, left acromion, thyroid cartilage, right sternum, bilateral ribs, thoracolumbar spine, bilateral pelvis. -Bone marrow biopsy on 02/14/2020 at [facility] showed plasma cell myeloma, kappa light chain restricted, sheets of plasma cells representing 60 to 70% of cells.  Myeloma FISH is pending. 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 -We discussed about change in treatment to daratumumab, bortezomib and dexamethasone for 2 cycles.  If there is no improvement, will consider switching it to pomalidomide, daratumumab and dexamethasone. -We discussed about the side effects of daratumumab and bortezomib in detail. -Patient would like to get Covid vaccine this Thursday.  He will start the regimen on Monday.  2.  Metastatic left breast cancer to the bones: -Diagnosed in 2011, he will continue tamoxifen.  3.  ID prophylaxis: -She will continue acyclovir twice daily.  4.  Peripheral neuropathy: -He will continue gabapentin 300 mg twice daily.  5.  Myeloma bone disease: -He is receiving denosumab.  Last given on 12/17/2019.  It is on hold due to biopsy on the upper GI. -We will make sure he does not have ONJ prior to restarting it.

## 2020-02-20 NOTE — Progress Notes (Signed)
[address] [place], [place] [address]   CLINIC:  Medical Oncology/Hematology  PCP:  [name], MD [place] [address] [phone]   REASON FOR VISIT:  Follow-up for Multiple Myeloma  CURRENT THERAPY: Carfilzomib   BRIEF ONCOLOGIC HISTORY:  Oncology History  Breast cancer, male ([place])  12/31/2009 Initial Biopsy   Biopsy of L breast    12/31/2009 Pathology Results   Invasive ductal carcinoma, ER/PR+, HER 2 negative   12/31/2009 Imaging   Ultrasound showing a 2.43 x 1.85 x 3 cm hypoechoic spiculated mass in the 12 o clock L breast retroareolar region   01/01/2010 -  Anti-estrogen oral therapy   Tamoxifen 20 mg daily   01/06/2010 Imaging   Bone scan abnormal uptake in the diaphysis of the R humerus, abnormal in the R third, fifth and sixth ribs, lesion also noted in the sternum.   02/03/2010 Surgery   Rod placement and fixation of R humerus by Dr. [name]   02/05/2010 - 02/18/2010 Radiation Therapy   30Gy in 10 fractions of 3 Gy per fraction to R pathologic fracture   03/11/2010 -  Chemotherapy   Denosumab monthly, now every 3 months. Started at [facility]    06/09/2016 Imaging   Three hypermetabolic osseous lesions in the sternum, left ilium and right ilium, as discussed above, likely represent osseous metastases. At this time, these are not recognizable on the CT images. 2. No extra skeletal metastatic disease identified in the neck, chest, abdomen or pelvis.   10/13/2016 Progression   PET shows various new and enlarging osseous metastatic lesions with no definite extra osseous metastatic disease currently identified.    12/31/2016 Progression   1. Multifocal hypermetabolic osseous metastases throughout the axial and proximal appendicular skeleton, which are increased in size, number and metabolism since 10/13/2016 PET-CT. 2. 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 3. No additional sites of hypermetabolic metastatic disease. 4. Chronic right mastoid sinusitis. 5. Aortic atherosclerosis.  One vessel coronary atherosclerosis.   Multiple myeloma ([place])  02/12/2017 Bone Marrow Biopsy   The marrow was variably cellular with large peritrabecular aggregates of kappa restricted plasma cells (66% by aspirate, 30% by Cd138). Cytogenetics +11.    03/01/2017 - 06/29/2017 Chemotherapy   RVD    05/26/2017 Bone Marrow Biopsy   Performed at [facility]:  Plasma cell myeloma in a 30% cellularmarrow with decreased trilineage hematopoiesis and 42% kappalight chain restricted plasma cells on the aspirate smears andlarge aggregates on the core biopsy.    07/12/2017 - 09/01/2017 Chemotherapy   3 cycles of carfizolmib/cyclophosphamide/dexamethasone     10/08/2017 Bone Marrow Transplant   Autotransplant at [facility]   02/26/2020 -  Chemotherapy   The patient had daratumumab-hyaluronidase-fihj (DARZALEX FASPRO) 1800-30000 MG-UT/15ML chemo SQ injection 1,800 mg, 1,800 mg, Subcutaneous,  Once, 0 of 12 cycles bortezomib SQ (VELCADE) chemo injection 2.75 mg, 1.3 mg/m2, Subcutaneous,  Once, 0 of 8 cycles  for chemotherapy treatment.    Multiple myeloma not having achieved remission ([place])  06/29/2017 Initial Diagnosis   Multiple myeloma not having achieved remission ([place])   02/26/2020 -  Chemotherapy   The patient had daratumumab-hyaluronidase-fihj (DARZALEX FASPRO) 1800-30000 MG-UT/15ML chemo SQ injection 1,800 mg, 1,800 mg, Subcutaneous,  Once, 0 of 12 cycles bortezomib SQ (VELCADE) chemo injection 2.75 mg, 1.3 mg/m2, Subcutaneous,  Once, 0 of 8 cycles  for  chemotherapy treatment.       CANCER STAGING: Cancer Staging Multiple myeloma ([place]) Staging form: Plasma Cell Myeloma and Plasma Cell Disorders, AJCC 8th Edition - Clinical stage from 05/03/2017: Beta-2-microglobulin (mg/L): 3.5, Albumin (g/dL):  3.4, ISS: Stage II, High-risk cytogenetics: Absent, LDH: Not assessed - Signed by Baird Cancer, PA-C on 05/03/2017    INTERVAL HISTORY:  Mr. Reuter 74 y.o. male seen for follow-up of multiple myeloma, breast cancer and anemia.  He had bone marrow biopsy done on 02/14/2020.  He also had PET scan done on 02/13/2020.  Denies any new onset bone pains.  He had biopsy of the soft tissue mass of the left anterior maxillary vestibule done on 01/30/2020.  He is taking tamoxifen without fail.  Numbness in the feet has been stable.  Appetite is 100%.  Energy levels are 50%.  He was also evaluated by Dr. Norma Fredrickson at [facility].  REVIEW OF SYSTEMS:  Review of Systems  Neurological: Positive for numbness.  All other systems reviewed and are negative.    PAST MEDICAL/SURGICAL HISTORY:  Past Medical History:  Diagnosis Date  . Anxiety   . Bone metastases ([place]) 09/10/2016  . Breast cancer ([place]) 2011   Stave IV breast cancer; radiation and tamoxifen  . Breast cancer, male ([place])    Stave IV breast cancer; radiation and tamoxifen  Overview:  Left breast ca with mets bone  Overview:  METS TO BONE  . GERD (gastroesophageal reflux disease)   . Hypertension   . Macular degeneration   . Multiple myeloma ([place]) 02/18/2017  . Peripheral neuropathy    Past Surgical History:  Procedure Laterality Date  . BACK SURGERY    . CATARACT EXTRACTION W/PHACO Left 02/03/2019   Procedure: CATARACT EXTRACTION PHACO AND INTRAOCULAR LENS PLACEMENT ([place]);  Surgeon: Baruch Goldmann, MD;  Location: [facility];  Service: Ophthalmology;  Laterality: Left;  CDE: 15.89  . HERNIA REPAIR    . PORTACATH PLACEMENT Left 06/10/2018   Procedure: INSERTION PORT-A-CATH;  Surgeon: Virl Cagey, MD;  Location: [facility];  Service: General;  Laterality: Left;  . right arm surgery       SOCIAL HISTORY:  Social History   Socioeconomic History  . Marital status: Married    Spouse name: Not on file  . Number of children: 3  .  Years of education: Not on file  . Highest education level: Not on file  Occupational History  . Not on file  Tobacco Use  . Smoking status: Never Smoker  . Smokeless tobacco: Former Network engineer and Sexual Activity  . Alcohol use: Yes    Alcohol/week: 24.0 standard drinks    Types: 24 Cans of beer per week  . Drug use: No  . Sexual activity: Not on file  Other Topics Concern  . Not on file  Social History Narrative  . Not on file   Social Determinants of Health   Financial Resource Strain:   . Difficulty of Paying Living Expenses: Not on file  Food Insecurity:   . Worried About Charity fundraiser in the Last Year: Not on file  . Ran Out of Food in the Last Year: Not on file  Transportation Needs:   . Lack of Transportation (Medical): Not on file  . Lack of Transportation (Non-Medical): Not on file  Physical Activity:   . Days of Exercise per Week: Not on file  . Minutes of Exercise per Session: Not on file  Stress:   . Feeling  of Stress : Not on file  Social Connections:   . Frequency of Communication with Friends and Family: Not on file  . Frequency of Social Gatherings with Friends and Family: Not on file  . Attends Religious Services: Not on file  . Active Member of Clubs or Organizations: Not on file  . Attends Archivist Meetings: Not on file  . Marital Status: Not on file  Intimate Partner Violence:   . Fear of Current or Ex-Partner: Not on file  . Emotionally Abused: Not on file  . Physically Abused: Not on file  . Sexually Abused: Not on file    FAMILY HISTORY:  Family History  Problem Relation Age of Onset  . Stroke Mother   . Cancer Maternal Aunt        cancer NOS; died in her 40s  . Lung cancer Maternal Uncle        smoker    CURRENT MEDICATIONS:  Outpatient Encounter Medications as of 02/20/2020  Medication Sig Note  . acyclovir (ZOVIRAX) 800 MG tablet TAKE 1 TABLET TWICE DAILY   . amLODipine (NORVASC) 5 MG tablet Take 1 tablet (5  mg total) by mouth daily.   Marland Kitchen aspirin EC 81 MG tablet Take 81 mg by mouth daily.   . calcium-vitamin D (OSCAL WITH D) 500-200 MG-UNIT TABS tablet Take 1 tablet by mouth 2 (two) times daily.   . Cholecalciferol (VITAMIN D) 50 MCG (2000 UT) CAPS Take 4,000 Units by mouth 2 (two) times daily.   Marland Kitchen denosumab (XGEVA) 120 MG/1.7ML SOLN injection Inject 120 mg into the skin once. 01/18/2019: [place]   . folic acid (FOLVITE) 1 MG tablet TAKE 1 TABLET BY MOUTH DAILY   . gabapentin (NEURONTIN) 300 MG capsule Take 1 capsule (300 mg total) by mouth 2 (two) times daily.   . hydrALAZINE (APRESOLINE) 50 MG tablet Take 1 tablet (50 mg total) by mouth every 8 (eight) hours. (Patient taking differently: Take 50 mg by mouth 2 (two) times daily. )   . loratadine (CLARITIN) 10 MG tablet Take 1 tablet (10 mg total) by mouth daily.   . metoprolol succinate (TOPROL-XL) 100 MG 24 hr tablet Take 100 mg by mouth daily.    . Multiple Vitamin (MULTIVITAMIN WITH MINERALS) TABS tablet Take 1 tablet by mouth daily.   . pantoprazole (PROTONIX) 40 MG tablet Take 40 mg by mouth every other day.    . tamoxifen (NOLVADEX) 20 MG tablet TAKE 1 TABLET (20 MG TOTAL) BY MOUTH DAILY.   . vitamin B-12 (CYANOCOBALAMIN) 1000 MCG tablet Take 1,000 mcg by mouth daily.   Marland Kitchen ALPRAZolam (XANAX) 0.5 MG tablet TAKE 1 TABLET BY MOUTH AT BEDTIME AS NEEDED FOR ANXIETY OR SLEEP (Patient not taking: Reported on 02/20/2020)   . augmented betamethasone dipropionate (DIPROLENE-AF) 0.05 % cream Apply topically as needed.    Marland Kitchen CARFILZOMIB IV Inject into the vein.   Marland Kitchen dexamethasone (DECADRON) 4 MG tablet Take 5 tablets (20 mg total) by mouth once a week. Take 5 tablets the day of treatment.   . diclofenac Sodium (VOLTAREN) 1 % GEL Use 1 to 2 times per day as directed by doctor   . ondansetron (ZOFRAN) 8 MG tablet TAKE ONE TABLET BY MOUTH EVERY 8 HOURS AS NEEDED (Patient not taking: Reported on 01/09/2020)   . polyethylene glycol (MIRALAX / GLYCOLAX)  packet Take 17 g by mouth daily as needed for mild constipation. (Patient not taking: Reported on 01/09/2020)   . prochlorperazine (COMPAZINE)  10 MG tablet Take 1 tablet (10 mg total) by mouth every 6 (six) hours as needed for nausea or vomiting. (Patient not taking: Reported on 01/09/2020)   . pseudoephedrine-guaifenesin (MUCINEX D) 60-600 MG 12 hr tablet Take 1 tablet by mouth 2 (two) times daily as needed for congestion.    . [DISCONTINUED] ALPRAZolam (XANAX) 0.5 MG tablet TAKE 1 TABLET BY MOUTH AT BEDTIME AS NEEDED FOR ANXIETY OR SLEEP   . [DISCONTINUED] heparin lock flush 100 unit/mL    . [DISCONTINUED] sodium chloride flush (NS) 0.9 % injection 10 mL    . [DISCONTINUED] sodium chloride flush (NS) 0.9 % injection 10 mL     No facility-administered encounter medications on file as of 02/20/2020.    ALLERGIES:  Allergies  Allergen Reactions  . Cefepime     Suspected severe thrombocytopenia is a result of cefepime induced antigen platelet destruction     PHYSICAL EXAM:  ECOG Performance status: 1  Vitals:   02/20/20 1602  BP: 132/64  Pulse: 73  Resp: 18  Temp: 97.8 F (36.6 C)  SpO2: 98%   Filed Weights   02/20/20 1602  Weight: 219 lb (99.3 kg)    Physical Exam Constitutional:      Appearance: Normal appearance.  HENT:     Head: Normocephalic.     Nose: Nose normal.     Mouth/Throat:     Mouth: Mucous membranes are moist.     Pharynx: Oropharynx is clear.  Eyes:     Extraocular Movements: Extraocular movements intact.     Conjunctiva/sclera: Conjunctivae normal.  Cardiovascular:     Rate and Rhythm: Normal rate and regular rhythm.     Pulses: Normal pulses.     Heart sounds: Normal heart sounds.  Pulmonary:     Effort: Pulmonary effort is normal.     Breath sounds: Normal breath sounds.  Abdominal:     General: Bowel sounds are normal.     Palpations: Abdomen is soft.  Musculoskeletal:        General: Normal range of motion.     Cervical back: Normal range  of motion.  Skin:    General: Skin is warm and dry.  Neurological:     General: No focal deficit present.     Mental Status: He is alert and oriented to person, place, and time. Mental status is at baseline.  Psychiatric:        Mood and Affect: Mood normal.        Behavior: Behavior normal.        Thought Content: Thought content normal.        Judgment: Judgment normal.      LABORATORY DATA:  I have reviewed the labs as listed.  CBC    Component Value Date/Time   WBC 6.2 02/06/2020 1035   RBC 2.92 (L) 02/06/2020 1035   HGB 10.5 (L) 02/06/2020 1035   HCT 31.9 (L) 02/06/2020 1035   PLT 247 02/06/2020 1035   MCV 109.2 (H) 02/06/2020 1035   MCH 36.0 (H) 02/06/2020 1035   MCHC 32.9 02/06/2020 1035   RDW 13.3 02/06/2020 1035   LYMPHSABS 1.3 02/06/2020 1035   MONOABS 0.6 02/06/2020 1035   EOSABS 0.2 02/06/2020 1035   BASOSABS 0.0 02/06/2020 1035   CMP Latest Ref Rng & Units 02/06/2020 01/23/2020 01/09/2020  Glucose 70 - 99 mg/dL 98 97 103(H)  BUN 8 - 23 mg/dL '18 19 17  '$ Creatinine 0.61 - 1.24 mg/dL 1.75(H) 1.52(H) 1.65(H)  Sodium 135 -  145 mmol/L 140 138 139  Potassium 3.5 - 5.1 mmol/L 4.1 4.0 3.9  Chloride 98 - 111 mmol/L 106 106 107  CO2 22 - 32 mmol/L '27 25 26  '$ Calcium 8.9 - 10.3 mg/dL 9.4 8.9 9.0  Total Protein 6.5 - 8.1 g/dL 6.3(L) 5.9(L) 5.9(L)  Total Bilirubin 0.3 - 1.2 mg/dL 0.5 0.6 0.4  Alkaline Phos 38 - 126 U/L 26(L) 26(L) 26(L)  AST 15 - 41 U/L '17 15 15  '$ ALT 0 - 44 U/L '16 16 16    '$ Radiology: -I have independently reviewed his PET scan and discussed with the patient.  ASSESSMENT & PLAN:   Multiple myeloma ([place]) 1.  Relapsed multiple myeloma: -Most recent progression on carfilzomib. -PET scan on 02/13/2020 showed left supraclavicular lymph node at the thoracic inlet.  Multifocal bone lesions largely improved from PET scan from 2018.  However there are residual lesions in the right acromion, left acromion, thyroid cartilage, right sternum, bilateral ribs,  thoracolumbar spine, bilateral pelvis. -Bone marrow biopsy on 02/14/2020 at [facility] showed plasma cell myeloma, kappa light chain restricted, sheets of plasma cells representing 60 to 70% of cells.  Myeloma FISH is pending. 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 -We discussed about change in treatment to daratumumab, bortezomib and dexamethasone for 2 cycles.  If there is no improvement, will consider switching it to pomalidomide, daratumumab and dexamethasone. -We discussed about the side effects of daratumumab and bortezomib in detail. -Patient would like to get Covid vaccine this Thursday.  He will start the regimen on Monday.  2.  Metastatic left breast cancer to the bones: -Diagnosed in 2011, he will continue tamoxifen.  3.  ID prophylaxis: -She will continue acyclovir twice daily.  4.  Peripheral neuropathy: -He will continue gabapentin 300 mg twice daily.  5.  Myeloma bone disease: -He is receiving denosumab.  Last given on 12/17/2019.  It is on hold due to biopsy on the upper GI. -We will make sure he does not have ONJ prior to restarting it.   Orders placed this encounter:  Orders Placed This Encounter  Procedures  . Pretreatment RBC phenotype      Derek Jack, MD [place] [phone]

## 2020-02-20 NOTE — Progress Notes (Signed)
START ON PATHWAY REGIMEN - Multiple Myeloma and Other Plasma Cell Dyscrasias     Cycles 1 through 3: A cycle is every 21 days:     Dexamethasone      Bortezomib      Daratumumab and hyaluronidase-fihj    Cycles 4 through 8: A cycle is every 21 days:     Dexamethasone      Bortezomib      Daratumumab and hyaluronidase-fihj    Cycles 9 and beyond: A cycle is every 28 days:     Daratumumab and hyaluronidase-fihj   **Always confirm dose/schedule in your pharmacy ordering system**  Patient Characteristics: Multiple Myeloma, Relapsed / Refractory, Second through Fourth Lines of Therapy Disease Classification: Multiple Myeloma R-ISS Staging: Unknown Therapeutic Status: Relapsed Line of Therapy: Third Line Intent of Therapy: Non-Curative / Palliative Intent, Discussed with Patient 

## 2020-02-20 NOTE — Patient Instructions (Signed)
[place] at [facility] Discharge Instructions  You were seen today by Dr. Delton Coombes. He went over your recent lab results. He discussed your new treatment and the side effects you may experience. He will see you back in 1 week for labs, treatment and follow up.   Thank you for choosing [place] at [facility] to provide your oncology and hematology care.  To afford each patient quality time with our provider, please arrive at least 15 minutes before your scheduled appointment time.   If you have a lab appointment with the [facility] please come in thru the  Main Entrance and check in at the main information desk  You need to re-schedule your appointment should you arrive 10 or more minutes late.  We strive to give you quality time with our providers, and arriving late affects you and other patients whose appointments are after yours.  Also, if you no show three or more times for appointments you may be dismissed from the clinic at the providers discretion.     Again, thank you for choosing [facility].  Our hope is that these requests will decrease the amount of time that you wait before being seen by our physicians.       _____________________________________________________________  Should you have questions after your visit to [facility], please contact our office at [phone] between the hours of 8:00 a.m. and 4:30 p.m.  Voicemails left after 4:00 p.m. will not be returned until the following business day.  For prescription refill requests, have your pharmacy contact our office and allow 72 hours.    Cancer Center Support Programs:   > Cancer Support Group  2nd Tuesday of the month 1pm-2pm, Journey Room

## 2020-02-22 DIAGNOSIS — Z23 Encounter for immunization: Secondary | ICD-10-CM | POA: Diagnosis not present

## 2020-02-27 ENCOUNTER — Other Ambulatory Visit: Payer: Self-pay

## 2020-02-27 ENCOUNTER — Inpatient Hospital Stay (HOSPITAL_COMMUNITY): Payer: Medicare Other | Attending: Hematology

## 2020-02-27 DIAGNOSIS — G629 Polyneuropathy, unspecified: Secondary | ICD-10-CM | POA: Insufficient documentation

## 2020-02-27 DIAGNOSIS — C9000 Multiple myeloma not having achieved remission: Secondary | ICD-10-CM | POA: Diagnosis not present

## 2020-02-27 DIAGNOSIS — Z5112 Encounter for antineoplastic immunotherapy: Secondary | ICD-10-CM | POA: Diagnosis not present

## 2020-02-27 DIAGNOSIS — R0602 Shortness of breath: Secondary | ICD-10-CM | POA: Insufficient documentation

## 2020-02-27 DIAGNOSIS — Z9481 Bone marrow transplant status: Secondary | ICD-10-CM | POA: Diagnosis not present

## 2020-02-27 DIAGNOSIS — C7951 Secondary malignant neoplasm of bone: Secondary | ICD-10-CM | POA: Diagnosis not present

## 2020-02-27 DIAGNOSIS — C50922 Malignant neoplasm of unspecified site of left male breast: Secondary | ICD-10-CM | POA: Diagnosis not present

## 2020-02-27 DIAGNOSIS — I700 Atherosclerosis of aorta: Secondary | ICD-10-CM | POA: Diagnosis not present

## 2020-02-27 DIAGNOSIS — Z79899 Other long term (current) drug therapy: Secondary | ICD-10-CM | POA: Insufficient documentation

## 2020-02-27 DIAGNOSIS — F419 Anxiety disorder, unspecified: Secondary | ICD-10-CM | POA: Diagnosis not present

## 2020-02-27 DIAGNOSIS — Z5111 Encounter for antineoplastic chemotherapy: Secondary | ICD-10-CM | POA: Insufficient documentation

## 2020-02-27 DIAGNOSIS — K219 Gastro-esophageal reflux disease without esophagitis: Secondary | ICD-10-CM | POA: Insufficient documentation

## 2020-02-27 DIAGNOSIS — I2510 Atherosclerotic heart disease of native coronary artery without angina pectoris: Secondary | ICD-10-CM | POA: Diagnosis not present

## 2020-02-27 LAB — CBC WITH DIFFERENTIAL/PLATELET
Abs Immature Granulocytes: 0.01 10*3/uL (ref 0.00–0.07)
Basophils Absolute: 0 10*3/uL (ref 0.0–0.1)
Basophils Relative: 1 %
Eosinophils Absolute: 0.1 10*3/uL (ref 0.0–0.5)
Eosinophils Relative: 3 %
HCT: 32.6 % — ABNORMAL LOW (ref 39.0–52.0)
Hemoglobin: 10.5 g/dL — ABNORMAL LOW (ref 13.0–17.0)
Immature Granulocytes: 0 %
Lymphocytes Relative: 28 %
Lymphs Abs: 1.2 10*3/uL (ref 0.7–4.0)
MCH: 35.5 pg — ABNORMAL HIGH (ref 26.0–34.0)
MCHC: 32.2 g/dL (ref 30.0–36.0)
MCV: 110.1 fL — ABNORMAL HIGH (ref 80.0–100.0)
Monocytes Absolute: 0.6 10*3/uL (ref 0.1–1.0)
Monocytes Relative: 14 %
Neutro Abs: 2.3 10*3/uL (ref 1.7–7.7)
Neutrophils Relative %: 54 %
Platelets: 179 10*3/uL (ref 150–400)
RBC: 2.96 MIL/uL — ABNORMAL LOW (ref 4.22–5.81)
RDW: 13.4 % (ref 11.5–15.5)
WBC: 4.2 10*3/uL (ref 4.0–10.5)
nRBC: 0 % (ref 0.0–0.2)

## 2020-02-27 LAB — COMPREHENSIVE METABOLIC PANEL
ALT: 16 U/L (ref 0–44)
AST: 18 U/L (ref 15–41)
Albumin: 4 g/dL (ref 3.5–5.0)
Alkaline Phosphatase: 26 U/L — ABNORMAL LOW (ref 38–126)
Anion gap: 6 (ref 5–15)
BUN: 19 mg/dL (ref 8–23)
CO2: 26 mmol/L (ref 22–32)
Calcium: 8.7 mg/dL — ABNORMAL LOW (ref 8.9–10.3)
Chloride: 104 mmol/L (ref 98–111)
Creatinine, Ser: 1.65 mg/dL — ABNORMAL HIGH (ref 0.61–1.24)
GFR calc Af Amer: 48 mL/min — ABNORMAL LOW (ref >60)
GFR calc non Af Amer: 41 mL/min — ABNORMAL LOW (ref >60)
Glucose, Bld: 104 mg/dL — ABNORMAL HIGH (ref 70–99)
Potassium: 4 mmol/L (ref 3.5–5.1)
Sodium: 136 mmol/L (ref 135–145)
Total Bilirubin: 0.3 mg/dL (ref 0.3–1.2)
Total Protein: 6.4 g/dL — ABNORMAL LOW (ref 6.5–8.1)

## 2020-02-27 LAB — TYPE AND SCREEN
ABO/RH(D): O POS
Antibody Screen: NEGATIVE

## 2020-02-27 NOTE — Patient Instructions (Addendum)
Daratumumab (Darzalex Faspro)  About This Drug Daratumumab is used to treat cancer. It is given under the skin in your abdomen (subcutaneously).  Possible Side Effects  . You may have a reaction to the drug. Sometimes you may be given medication to stop or lessen these side effects. Your nurse will check you closely for these signs: fever or shaking chills, flushing, facial swelling, feeling dizzy, headache, trouble breathing, rash, itching, chest tightness, or chest pain. These reactions may happen after your infusion. If this happens, call 911 for emergency care.  . Decrease in the number of white blood cells and platelets. This may raise your risk of infection, and raise your risk of bleeding.  . Fever and chills  . Tiredness  . Feeling dizzy  . Trouble sleeping  . Cough and trouble breathing  . Upper respiratory infection  . Nausea and throwing up (vomiting)  . Loose bowel movements (diarrhea)  . Constipation (not able to move bowels)  . Muscle spasms  . Pain in the joints  . Back pain  . Swelling of your legs, ankles and/or feet  . Effects on the nerves are called peripheral neuropathy. You may feel numbness, tingling, or pain in your hands and feet. It may be hard for you to button your clothes, open jars, or walk as usual. The effect on the nerves may get worse with more doses of the drug. These effects get better in some people after the drug is stopped but it does not get better in all people.  Note: Each of the side effects above was reported in 20% or greater of patients treated with daratumumab. Not all possible side effects are included above.  Warnings and Precautions  . Severe decrease in the number of white blood cells and platelets    Severe allergic reaction  . This medication can affect the results of blood tests that match your blood type. Your blood type will be tested before treatment. Be sure to tell all healthcare providers you are taking this  medicine before receiving blood transfusions, even for 6 months after your last dose.  Important Information  . This drug may be present in the saliva, tears, sweat, urine, stool, vomit, semen, and vaginal secretions. Talk to your doctor and/or your nurse about the necessary precautions to take during this time.  Treating Side Effects  . Drink plenty of fluids (a minimum of eight glasses per day is recommended).  . If you throw up or have loose bowel movements, you should drink more fluids so that you do not become dehydrated (lack of water in the body from losing too much fluid).  . To help with nausea and vomiting, eat small, frequent meals instead of three large meals a day. Choose foods and drinks that are at room temperature. Ask your nurse or doctor about other helpful tips and medicine that is available to help stop or lessen these symptoms.  . If you have diarrhea, eat low-fiber foods that are high in protein and calories and avoid foods that can irritate your digestive tracts or lead to cramping.  . Ask your nurse or doctor about medicine that can lessen or stop your diarrhea or constipation.  . If you are not able to move your bowels, check with your doctor or nurse before you use enemas, laxatives, or suppositories.  . Manage tiredness by pacing your activities for the day.  . Be sure to include periods of rest between energy-draining activities.  . To decrease the  risk of infection, wash your hands regularly.  . Avoid close contact with people who have a cold, the flu, or other infections.  . Take your temperature as your doctor or nurse tells you, and whenever you feel like you may have a fever.  . To help decrease the risk of bleeding, use a soft toothbrush. Check with your nurse before using dental floss.  . Be very careful when using knives or tools.  . Use an electric shaver instead of a razor.  Marland Kitchen Keeping your pain under control is important to your well-being.  Please tell your doctor or nurse if you are experiencing pain.  . If you are dizzy, get up slowly after sitting or lying.  . If you are having trouble sleeping, talk to your nurse or doctor on tips to help you sleep better.  . If you have numbness and tingling in your hands and feet, be careful when cooking, walking, and handling sharp objects and hot liquids.  . Infusion reactions may rarely occur after your infusion. If this happens, call 911 for emergency care.  Food and Drug Interactions  . There are no known interactions of daratumumab with food.  . This drug may interact with other medicines. Tell your doctor and pharmacist about all the prescription and over-the-counter medicines and dietary supplements (vitamins, minerals, herbs and others) that you are taking at this time. Also, check with your doctor or pharmacist before starting any new prescription or over-the-counter medicines, or dietary supplements to make sure that there are no interactions.  When to Call the Doctor  Call your doctor or nurse if you have any of these symptoms and/or any new or unusual symptoms:  . Fever of 100.4 F (38 C) or higher  . Chills  . Pain in your chest  . Coughing up yellow, green, or bloody mucus.  . Wheezing or trouble breathing  . Tiredness that interferes with your daily activities  . Trouble falling or staying asleep  . Feeling dizzy or lightheaded  . Easy bleeding or bruising  . Nausea that stops you from eating or drinking and/or is not relieved by prescribed medicines  . Vomiting  . No bowel movement in 3 days or when you feel uncomfortable.  . Loose bowel movements (diarrhea) 4 times a day or loose bowel movements with lack of strength or a feeling of being dizzy  . Weight gain of 5 pounds in one week (fluid retention)  . Swelling of your legs, ankles and/or feet  . Pain that does not go away, or is not relieved by prescribed medicines  . Signs of infusion  reaction: fever or shaking chills, flushing, facial swelling, feeling dizzy, headache, trouble breathing, rash, itching, chest tightness, or chest pain. If this happens, call 911 for emergency care.  . Numbness, tingling, or pain in your hands and feet  . If you think you may be pregnant or may have impregnated your partner  Reproduction Warnings  . Pregnancy warning: This drug may have harmful effects on the unborn baby. Women of childbearing potential should use effective methods of birth control during your cancer treatment and for at least 3 months after treatment. Let your doctor know right away if you think you may be pregnant.  . Breastfeeding warning: It is not known if this drug passes into breast milk. For this reason, women should talk to their doctor about the risks and benefits of breastfeeding during treatment with this drug because this drug may enter the  breast milk and cause harm to a breastfeeding baby.  . Fertility warning: Human fertility studies have not been done with this drug. Talk with your doctor or nurse if you plan to have children. Ask for information on sperm or egg banking.  Bortezomib (Velcade)  About This Drug  Bortezomib is used to treat cancer. It is given in the vein (IV) or by a shot under the skin (subcutaneously).  You will receive this injections under your skin.  Possible Side Effects  . Bone marrow suppression. Decrease in the number of white blood cells, red blood cells, and platelets. This may raise your risk of infection, make you tired and weak (fatigue), and raise your risk of bleeding.  . Nausea and vomiting (throwing up)  . Constipation (not able to move bowels)  . Diarrhea (loose bowel movements)  . Fever  . Tiredness  . Decreased appetite (decreased hunger)  . Effects on the nerves are called peripheral neuropathy. You may feel numbness, tingling, or pain in your hands and feet. It may be hard for you to button your clothes, open  jars, or walk as usual. The effect on the nerves may get worse with more doses of the drug. These effects get better in some people after the drug is stopped but it does not get better in all people.  . Rash  Note: Each of the side effects above was reported in 20% or greater of patients treated with bortezomib. Not all possible side effects are included above.  Warnings and Precautions  . Severe peripheral neuropathy  . Low blood pressure  . Congestive heart failure - your heart has less ability to pump blood properly.  . Trouble breathing because of fluid build-up and/or inflammation in your lungs  . Nausea, vomiting, diarrhea and constipation which sometimes requires treatment to help lessen these side effects. There is also an increased risk of developing a partial or complete blockage of your small and/or large intestine.  . Changes in your central nervous system can happen. The central nervous system is made up of your brain and spinal cord. You could feel extreme tiredness, agitation, confusion, have hallucinations (see or hear things that are not there), trouble understanding or speaking, loss of control of your bowels or bladder, eyesight changes, numbness or lack of strength to your arms, legs, face, or body, seizures or coma. If you start to have any of these symptoms let your doctor know right away.  . Tumor lysis syndrome: This drug may act on the cancer cells very quickly. This may affect how your kidneys work.  . Changes in your liver function   - Increased risk of a syndrome that affects your red blood cells, platelets and blood vessels in your kidneys, which can cause kidney failure and be life-threatening.  Important Information  . This drug may be present in the saliva, tears, sweat, urine, stool, vomit, semen, and vaginal secretions. Talk to your doctor and/or your nurse about the necessary precautions to take during this time.  . This drug may impair your ability to  drive or use machinery. Use caution and tell your nurse or doctor if you feel dizzy, very sleepy, and/or experience low blood pressure.  Treating Side Effects  . Manage tiredness by pacing your activities for the day.  . Be sure to include periods of rest between energy-draining activities.  . To decrease the risk of infection, wash your hands regularly.  . Avoid close contact with people who have a cold,  the flu, or other infections.  . Take your temperature as your doctor or nurse tells you, and whenever you feel like you may have a fever.  . To help decrease the risk of bleeding, use a soft toothbrush. Check with your nurse before using dental floss.  . Be very careful when using knives or tools.  . Use an electric shaver instead of a razor.  . Ask your doctor or nurse about medicines that are available to help stop or lessen constipation.  . If you are not able to move your bowels, check with your doctor or nurse before you use enemas, laxatives, or suppositories.  . Drink plenty of fluids (a minimum of eight glasses per day is recommended).  . If you throw up or have loose bowel movements, you should drink more fluids so that you do not become dehydrated (lack of water in the body from losing too much fluid).  . If you have diarrhea, eat low-fiber foods that are high in protein and calories and avoid foods that can irritate your digestive tracts or lead to cramping.  . Ask your nurse or doctor about medicine that can lessen or stop your diarrhea.  . To help with nausea and vomiting, eat small, frequent meals instead of three large meals a day. Choose foods and drinks that are at room temperature. Ask your nurse or doctor about other helpful tips and medicine that is available to help stop or lessen these symptoms.  . To help with decreased appetite, eat foods high in calories and protein, such as meat, poultry, fish, dry beans, tofu, eggs, nuts, milk, yogurt, cheese, ice  cream, pudding, and nutritional supplements.  . Consider using sauces and spices to increase taste. Daily exercise, with your doctor's approval, may increase your appetite.  . If you have numbness and tingling in your hands and feet, be careful when cooking, walking, and handling sharp objects and hot liquids.  . If you get a rash do not put anything on it unless your doctor or nurse says you may. Keep the area around the rash clean and dry. Ask your doctor for medicine if your rash bothers you.  Food and Drug Interactions  . This drug may interact with grapefruit and grapefruit juice. Talk to your doctor as this could make side effects worse.  . Check with your doctor or pharmacist about all other prescription medicines and over-the-counter medicines and dietary supplements (vitamins, minerals, herbs and others) you are taking before starting this medicine as there are known drug interactions with bortezomib. Also, check with your doctor or pharmacist before starting any new prescription or over-the-counter medicines, or dietary supplements to make sure that there are no interactions.  . Avoid the use of St. John's Wort with bortezomib as this may lower the levels of the drug in your body, which can make it less effective.  When to Call the Doctor  Call your doctor or nurse if you have any of these symptoms and/or any new or unusual symptoms:  . Fever of 100.4 F (38 C) or higher  . Chills  . Tiredness that interferes with your daily activities  . Feeling dizzy or lightheaded  . Feeling that your heart is beating in a fast or not normal way (palpitations)  . Cough  . Wheezing or trouble breathing  . Easy bleeding or bruising  . Confusion and/or agitation  . Hallucinations  . Trouble understanding or speaking  . Blurry vision or changes in your eyesight  .  Numbness or lack of strength to your arms, legs, face, or body  . Symptoms of a seizure such as confusion, blacking  out, passing out, loss of hearing or vision, blurred vision, unusual smells or tastes (such as burning rubber), trouble talking, tremors or shaking in parts or all of the body, repeated body movements, tense muscles that do not relax, and loss of control of urine and bowels. If you or your family member suspects you are having a seizure, call 911 right away.  . Nausea that stops you from eating or drinking and/or is not relieved by prescribed medicines  . Throwing up more than 3 times a day  . Lasting loss of appetite or rapid weight loss of five pounds in a week  . No bowel movement in 3 days or when you feel uncomfortable.  . Abdominal pain that does not go away  . Diarrhea, 4 times in one day or diarrhea with lack of strength or a feeling of being dizzy  . Numbness, tingling, or pain your hands and feet  . Swelling of legs, ankles, and/or feet  . Weight gain of 5 pounds in one week (fluid retention)  . Decreased urine, or very dark urine  . New rash and/or itching  . Rash that is not relieved by prescribed medicines  . Signs of tumor lysis: Confusion or agitation, decreased urine, nausea/vomiting, diarrhea, muscle cramping, numbness and/or tingling, seizures.  . Signs of possible liver problems: dark urine, pale bowel movements, bad stomach pain, feeling very tired and weak, unusual itching, or yellowing of the eyes or skin  . If you think you are pregnant or may have impregnated your partner  Reproduction Warnings  . Pregnancy warning: This drug can have harmful effects on the unborn baby. Women of child bearing potential should use effective methods of birth control during your cancer treatment and for at least 7 months after treatment. Men with male partners of childbearing potential should use effective methods of birth control during your cancer treatment and for at least 4 months after your cancer treatment. Let your doctor know right away if you think you may be  pregnant or may have impregnated your partner.  . Breastfeeding warning: Women should not breastfeed during treatment and for 2 months month after treatment because this drug could enter the breast milk and cause harm to a breastfeeding baby.  . Fertility warning: In men and women both, this drug may affect your ability to have children in the future. Talk with your doctor or nurse if you plan to have children. Ask for information on sperm or egg banking.      SELF CARE ACTIVITIES WHILE ON IMMUNOTHERAPY:  Hydration Increase your fluid intake 48 hours prior to treatment and drink at least 8 to 12 cups (64 ounces) of water/decaffeinated beverages per day after treatment. You can still have your cup of coffee or soda but these beverages do not count as part of your 8 to 12 cups that you need to drink daily. No alcohol intake.  Medications Continue taking your normal prescription medication as prescribed.  If you start any new herbal or new supplements please let us know first to make sure it is safe.  Mouth Care Have teeth cleaned professionally before starting treatment. Keep dentures and partial plates clean. Use soft toothbrush and do not use mouthwashes that contain alcohol. Biotene is a good mouthwash that is available at most pharmacies or may be ordered by calling [phone]. Use warm  salt water gargles (1 teaspoon salt per 1 quart warm water) before and after meals and at bedtime. Or you may rinse with 2 tablespoons of three-percent hydrogen peroxide mixed in eight ounces of water. If you are still having problems with your mouth or sores in your mouth please call the clinic. If you need dental work, please let the doctor know before you go for your appointment so that we can coordinate the best possible time for you in regards to your chemo regimen. You need to also let your dentist know that you are actively taking chemo. We may need to do labs prior to your dental  appointment.  Skin Care Always use sunscreen that has not expired and with SPF (Sun Protection Factor) of 50 or higher. Wear hats to protect your head from the sun. Remember to use sunscreen on your hands, ears, face, & feet.  Use good moisturizing lotions such as udder cream, eucerin, or even Vaseline. Some chemotherapies can cause dry skin, color changes in your skin and nails.    . Avoid long, hot showers or baths. . Use gentle, fragrance-free soaps and laundry detergent. . Use moisturizers, preferably creams or ointments rather than lotions because the thicker consistency is better at preventing skin dehydration. Apply the cream or ointment within 15 minutes of showering. Reapply moisturizer at night, and moisturize your hands every time after you wash them.   Infection Prevention Please wash your hands for at least 30 seconds using warm soapy water. Handwashing is the #1 way to prevent the spread of germs. Stay away from sick people or people who are getting over a cold. If you develop respiratory systems such as green/yellow mucus production or productive cough or persistent cough let us know and we will see if you need an antibiotic. It is a good idea to keep a pair of gloves on when going into grocery stores/[employer] to decrease your risk of coming into contact with germs on the carts, etc. Carry alcohol hand gel with you at all times and use it frequently if out in public. If your temperature reaches 100.5 or higher please call the clinic and let us know.  If it is after hours or on the weekend please go to the ER if your temperature is over 100.4.  Please have your own personal thermometer at home to use.    Sex and bodily fluids If you are going to have sex, a condom must be used to protect the person that isn't taking immunotherapy. For a few days after treatment, immunotherapy can be excreted through your bodily fluids.  When using the toilet please close the lid and flush the toilet twice.   Do this for a few day after you have had immunotherapy.   Contraception It is not known for sure whether or not immunotherapy drugs can be passed on through semen or secretions from the vagina. Because of this some doctors advise people to use a barrier method if you have sex during treatment. This applies to vaginal, anal or oral sex.  Generally, doctors advise a barrier method only for the time you are actually having the treatment and for about a week after your treatment.  Advice like this can be worrying, but this does not mean that you have to avoid being intimate with your partner. You can still have close contact with your partner and continue to enjoy sex.  Animals If you have cats or birds we just ask that you not change the  litter or change the cage.  Please have someone else do this for you while you are on immunotherapy.   Food Safety During and After Cancer Treatment Food safety is important for people both during and after cancer treatment. Cancer and cancer treatments, such as chemotherapy, radiation therapy, and stem cell/bone marrow transplantation, often weaken the immune system. This makes it harder for your body to protect itself from foodborne illness, also called food poisoning. Foodborne illness is caused by eating food that contains harmful bacteria, parasites, or viruses.  Foods to avoid Some foods have a higher risk of becoming tainted with bacteria. These include: Marland Kitchen Unwashed fresh fruit and vegetables, especially leafy vegetables that can hide dirt and other contaminants . Raw sprouts, such as alfalfa sprouts . Raw or undercooked beef, especially ground beef, or other raw or undercooked meat and poultry . Fatty, fried, or spicy foods immediately before or after treatment.  These can sit heavy on your stomach and make you feel nauseous. . Raw or undercooked shellfish, such as oysters. . Sushi and sashimi, which often contain raw fish.  . Unpasteurized beverages,  such as unpasteurized fruit juices, raw milk, raw yogurt, or cider . Undercooked eggs, such as soft boiled, over easy, and poached; raw, unpasteurized eggs; or foods made with raw egg, such as homemade raw cookie dough and homemade mayonnaise  Simple steps for food safety  Shop smart. . Do not buy food stored or displayed in an unclean area. . Do not buy bruised or damaged fruits or vegetables. . Do not buy cans that have cracks, dents, or bulges. . Pick up foods that can spoil at the end of your shopping trip and store them in a cooler on the way home.  Prepare and clean up foods carefully. . Rinse all fresh fruits and vegetables under running water, and dry them with a clean towel or paper towel. . Clean the top of cans before opening them. . After preparing food, wash your hands for 20 seconds with hot water and soap. Pay special attention to areas between fingers and under nails. . Clean your utensils and dishes with hot water and soap. Marland Kitchen Disinfect your kitchen and cutting boards using 1 teaspoon of liquid, unscented bleach mixed into 1 quart of water.    Dispose of old food. . Eat canned and packaged food before its expiration date (the "use by" or "best before" date). . Consume refrigerated leftovers within 3 to 4 days. After that time, throw out the food. Even if the food does not smell or look spoiled, it still may be unsafe. Some bacteria, such as Listeria, can grow even on foods stored in the refrigerator if they are kept for too long.  Take precautions when eating out. . At restaurants, avoid buffets and salad bars where food sits out for a long time and comes in contact with many people. Food can become contaminated when someone with a virus, often a norovirus, or another "bug" handles it. . Put any leftover food in a "to-go" container yourself, rather than having the server do it. And, refrigerate leftovers as soon as you get home. . Choose restaurants that are clean and that  are willing to prepare your food as you order it cooked.    SYMPTOMS TO REPORT AS SOON AS POSSIBLE AFTER TREATMENT:   FEVER GREATER THAN 100.5 F  CHILLS WITH OR WITHOUT FEVER  NAUSEA AND VOMITING THAT IS NOT CONTROLLED WITH YOUR NAUSEA MEDICATION  UNUSUAL SHORTNESS OF BREATH  UNUSUAL BRUISING OR BLEEDING  TENDERNESS IN MOUTH AND THROAT WITH OR WITHOUT PRESENCE OF ULCERS  URINARY PROBLEMS  BOWEL PROBLEMS  UNUSUAL RASH     Wear comfortable clothing and clothing appropriate for easy access to any Portacath or PICC line. Let us know if there is anything that we can do to make your therapy better!   What to do if you need assistance after hours or on the weekends: CALL [phone].  HOLD on the line, do not hang up.  You will hear multiple messages but at the end you will be connected with a nurse triage line.  They will contact the doctor if necessary.  Most of the time they will be able to assist you.  Do not call the hospital operator.    I have been informed and understand all of the instructions given to me and have received a copy. I have been instructed to call the clinic [phone] or my family physician as soon as possible for continued medical care, if indicated. I do not have any more questions at this time but understand that I may call the [place] or the Patient Navigator at [phone] during office hours should I have questions or need assistance in obtaining follow-up care.  [facility] Discharge Instructions for Patients Receiving Chemotherapy   Beginning January 23rd 2017 lab work for the [facility] will be done in the  Main lab at [facility] on 1st floor. If you have a lab appointment with the [facility] please come in thru the  Main Entrance and check in at the main information desk   Today you received the following chemotherapy agents Darzalex and Velcade injections. Follow-up as scheduled. Call clinic for any questions or  concerns  To help prevent nausea and vomiting after your treatment, we encourage you to take your nausea medication   If you develop nausea and vomiting, or diarrhea that is not controlled by your medication, call the clinic.  The clinic phone number is [phone]. Office hours are Monday-Friday 8:30am-5:00pm.  BELOW ARE SYMPTOMS THAT SHOULD BE REPORTED IMMEDIATELY:  *FEVER GREATER THAN 101.0 F  *CHILLS WITH OR WITHOUT FEVER  NAUSEA AND VOMITING THAT IS NOT CONTROLLED WITH YOUR NAUSEA MEDICATION  *UNUSUAL SHORTNESS OF BREATH  *UNUSUAL BRUISING OR BLEEDING  TENDERNESS IN MOUTH AND THROAT WITH OR WITHOUT PRESENCE OF ULCERS  *URINARY PROBLEMS  *BOWEL PROBLEMS  UNUSUAL RASH Items with * indicate a potential emergency and should be followed up as soon as possible. If you have an emergency after office hours please contact your primary care physician or go to the nearest emergency department.  Please call the clinic during office hours if you have any questions or concerns.   You may also contact the Patient Navigator at [phone] should you have any questions or need assistance in obtaining follow up care.      Resources For Cancer Patients and their Caregivers ? American Cancer Society: Can assist with transportation, wigs, general needs, runs Look Good Feel Better.        (608)308-9582 ? Cancer Care: Provides financial assistance, online support groups, medication/co-pay assistance.  1-800-813-HOPE 717-791-3010) ? [place] Assists [place] cancer patients and their families through emotional , educational and financial support.  339-664-8838 ? [place] DSS Where to apply for food stamps, Medicaid and utility assistance. 262-376-5532 ? [facility]: Transportation to medical appointments. 260 623 0686 ? Social Security Administration: May apply for disability if  have a Stage IV cancer. 812-220-6348 479-160-8339 ? [employer],  Disability and Transit Services: Assists with nutrition, care and transit needs. 231-782-7165

## 2020-02-28 ENCOUNTER — Ambulatory Visit (HOSPITAL_COMMUNITY): Payer: Medicare Other

## 2020-02-28 ENCOUNTER — Encounter (HOSPITAL_COMMUNITY): Payer: Self-pay | Admitting: Hematology

## 2020-02-28 ENCOUNTER — Inpatient Hospital Stay (HOSPITAL_COMMUNITY): Payer: Medicare Other

## 2020-02-28 ENCOUNTER — Other Ambulatory Visit: Payer: Self-pay

## 2020-02-28 ENCOUNTER — Inpatient Hospital Stay (HOSPITAL_BASED_OUTPATIENT_CLINIC_OR_DEPARTMENT_OTHER): Payer: Medicare Other | Admitting: Hematology

## 2020-02-28 VITALS — BP 123/60 | HR 69 | Temp 98.4°F | Resp 18

## 2020-02-28 DIAGNOSIS — C9000 Multiple myeloma not having achieved remission: Secondary | ICD-10-CM

## 2020-02-28 DIAGNOSIS — Z5112 Encounter for antineoplastic immunotherapy: Secondary | ICD-10-CM | POA: Diagnosis not present

## 2020-02-28 DIAGNOSIS — C7951 Secondary malignant neoplasm of bone: Secondary | ICD-10-CM | POA: Diagnosis not present

## 2020-02-28 DIAGNOSIS — Z5111 Encounter for antineoplastic chemotherapy: Secondary | ICD-10-CM | POA: Diagnosis not present

## 2020-02-28 DIAGNOSIS — G629 Polyneuropathy, unspecified: Secondary | ICD-10-CM | POA: Diagnosis not present

## 2020-02-28 DIAGNOSIS — C50922 Malignant neoplasm of unspecified site of left male breast: Secondary | ICD-10-CM | POA: Diagnosis not present

## 2020-02-28 LAB — PRETREATMENT RBC PHENOTYPE

## 2020-02-28 MED ORDER — DIPHENHYDRAMINE HCL 25 MG PO CAPS
50.0000 mg | ORAL_CAPSULE | Freq: Once | ORAL | Status: AC
  Administered 2020-02-28: 10:00:00 50 mg via ORAL
  Filled 2020-02-28: qty 2

## 2020-02-28 MED ORDER — DARATUMUMAB-HYALURONIDASE-FIHJ 1800-30000 MG-UT/15ML ~~LOC~~ SOLN
1800.0000 mg | Freq: Once | SUBCUTANEOUS | Status: AC
  Administered 2020-02-28: 11:00:00 1800 mg via SUBCUTANEOUS
  Filled 2020-02-28: qty 15

## 2020-02-28 MED ORDER — BORTEZOMIB CHEMO SQ INJECTION 3.5 MG (2.5MG/ML)
1.3000 mg/m2 | Freq: Once | INTRAMUSCULAR | Status: AC
  Administered 2020-02-28: 11:00:00 2.75 mg via SUBCUTANEOUS
  Filled 2020-02-28: qty 1.1

## 2020-02-28 MED ORDER — ACETAMINOPHEN 325 MG PO TABS
650.0000 mg | ORAL_TABLET | Freq: Once | ORAL | Status: AC
  Administered 2020-02-28: 650 mg via ORAL
  Filled 2020-02-28: qty 2

## 2020-02-28 MED ORDER — MONTELUKAST SODIUM 10 MG PO TABS
10.0000 mg | ORAL_TABLET | Freq: Once | ORAL | Status: AC
  Administered 2020-02-28: 10 mg via ORAL
  Filled 2020-02-28: qty 1

## 2020-02-28 MED ORDER — HEPARIN SOD (PORK) LOCK FLUSH 100 UNIT/ML IV SOLN
500.0000 [IU] | Freq: Once | INTRAVENOUS | Status: AC
  Administered 2020-02-28: 500 [IU] via INTRAVENOUS

## 2020-02-28 MED ORDER — SODIUM CHLORIDE 0.9% FLUSH
10.0000 mL | INTRAVENOUS | Status: DC | PRN
  Administered 2020-02-28: 10 mL via INTRAVENOUS

## 2020-02-28 MED ORDER — DEXAMETHASONE 4 MG PO TABS: 20.0000 mg | ORAL_TABLET | Freq: Once | ORAL | Status: DC

## 2020-02-28 MED ORDER — SODIUM CHLORIDE 0.9 % IV SOLN: INTRAVENOUS | Status: DC

## 2020-02-28 NOTE — Progress Notes (Signed)
0855 Labs reviewed with and pt seen by Dr. Delton Coombes and pt approved for Darzalex and Velcade injections today per MD          [place] tolerated Darzalex and Velcade injections well without complaints or incident. Reviewed side effects of Darzalex injection with drug specific printed information given to pt to take home. Pt verbalized understanding. VSS upon discharge. Pt discharged self ambulatory in satisfactory condition

## 2020-02-28 NOTE — Patient Instructions (Addendum)
[place] at [facility] Discharge Instructions  You were seen today by Dr. Delton Coombes. He went over your recent lab results. He will restart your Xgeva injections. He will see you back in 1 week for labs, treatment and follow up.   Thank you for choosing [place] at [facility] to provide your oncology and hematology care.  To afford each patient quality time with our provider, please arrive at least 15 minutes before your scheduled appointment time.   If you have a lab appointment with the [facility] please come in thru the  Main Entrance and check in at the main information desk  You need to re-schedule your appointment should you arrive 10 or more minutes late.  We strive to give you quality time with our providers, and arriving late affects you and other patients whose appointments are after yours.  Also, if you no show three or more times for appointments you may be dismissed from the clinic at the providers discretion.     Again, thank you for [facility].  Our hope is that these requests will decrease the amount of time that you wait before being seen by our physicians.       _____________________________________________________________  Should you have questions after your visit to [facility], please contact our office at [phone] between the hours of 8:00 a.m. and 4:30 p.m.  Voicemails left after 4:00 p.m. will not be returned until the following business day.  For prescription refill requests, have your pharmacy contact our office and allow 72 hours.    Cancer Center Support Programs:   > Cancer Support Group  2nd Tuesday of the month 1pm-2pm, Journey Room

## 2020-02-28 NOTE — Progress Notes (Signed)
Patient has been assessed, vital signs and labs have been reviewed by Dr. Delton Coombes. ANC, Creatinine, LFTs, and Platelets are within treatment parameters per Dr. Delton Coombes. The patient is good to proceed with treatment at this time. Patient has already taken Dexamethasone. Patient to restart Xgeva please give tomorrow per pt request.

## 2020-02-28 NOTE — Progress Notes (Signed)
[place], [place] [address]   CLINIC:  Medical Oncology/Hematology  PCP:  [name], MD [place] [address] [phone]   REASON FOR VISIT:  Follow-up for Multiple Myeloma  CURRENT THERAPY: Daratumumab, Velcade and dexamethasone.  BRIEF ONCOLOGIC HISTORY:  Oncology History  Breast cancer, male ([place])  12/31/2009 Initial Biopsy   Biopsy of L breast    12/31/2009 Pathology Results   Invasive ductal carcinoma, ER/PR+, HER 2 negative   12/31/2009 Imaging   Ultrasound showing a 2.43 x 1.85 x 3 cm hypoechoic spiculated mass in the 12 o clock L breast retroareolar region   01/01/2010 -  Anti-estrogen oral therapy   Tamoxifen 20 mg daily   01/06/2010 Imaging   Bone scan abnormal uptake in the diaphysis of the R humerus, abnormal in the R third, fifth and sixth ribs, lesion also noted in the sternum.   02/03/2010 Surgery   Rod placement and fixation of R humerus by Dr. [name]   02/05/2010 - 02/18/2010 Radiation Therapy   30Gy in 10 fractions of 3 Gy per fraction to R pathologic fracture   03/11/2010 -  Chemotherapy   Denosumab monthly, now every 3 months. Started at [facility]    06/09/2016 Imaging   Three hypermetabolic osseous lesions in the sternum, left ilium and right ilium, as discussed above, likely represent osseous metastases. At this time, these are not recognizable on the CT images. 2. No extra skeletal metastatic disease identified in the neck, chest, abdomen or pelvis.   10/13/2016 Progression   PET shows various new and enlarging osseous metastatic lesions with no definite extra osseous metastatic disease currently identified.    12/31/2016 Progression   1. Multifocal hypermetabolic osseous metastases throughout the axial and proximal appendicular skeleton, which are increased in size, number and metabolism since 10/13/2016 PET-CT. 2. 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 3. No additional sites of hypermetabolic metastatic disease. 4. Chronic right mastoid sinusitis. 5. Aortic atherosclerosis.  One vessel coronary atherosclerosis.   Multiple myeloma ([place])  02/12/2017 Bone Marrow Biopsy   The marrow was variably cellular with large peritrabecular aggregates of kappa restricted plasma cells (66% by aspirate, 30% by Cd138). Cytogenetics +11.    03/01/2017 - 06/29/2017 Chemotherapy   RVD    05/26/2017 Bone Marrow Biopsy   Performed at [facility]:  Plasma cell myeloma in a 30% cellularmarrow with decreased trilineage hematopoiesis and 42% kappalight chain restricted plasma cells on the aspirate smears andlarge aggregates on the core biopsy.    07/12/2017 - 09/01/2017 Chemotherapy   3 cycles of carfizolmib/cyclophosphamide/dexamethasone     10/08/2017 Bone Marrow Transplant   Autotransplant at [facility]   02/28/2020 -  Chemotherapy   The patient had daratumumab-hyaluronidase-fihj (DARZALEX FASPRO) 1800-30000 MG-UT/15ML chemo SQ injection 1,800 mg, 1,800 mg, Subcutaneous,  Once, 1 of 12 cycles Administration: 1,800 mg (02/28/2020) bortezomib SQ (VELCADE) chemo injection 2.75 mg, 1.3 mg/m2 = 2.75 mg, Subcutaneous,  Once, 1 of 8 cycles Administration: 2.75 mg (02/28/2020)  for chemotherapy treatment.    Multiple myeloma not having achieved remission ([place])  06/29/2017 Initial Diagnosis   Multiple myeloma not having achieved remission ([place])   02/28/2020 -  Chemotherapy   The patient had daratumumab-hyaluronidase-fihj (DARZALEX FASPRO) 1800-30000 MG-UT/15ML chemo SQ injection 1,800 mg, 1,800 mg, Subcutaneous,  Once, 1 of 12 cycles Administration: 1,800 mg (02/28/2020) bortezomib  SQ (VELCADE) chemo injection 2.75 mg, 1.3 mg/m2 = 2.75 mg, Subcutaneous,  Once, 1 of 8 cycles Administration: 2.75 mg (02/28/2020)  for chemotherapy treatment.       CANCER STAGING: Cancer  Staging Multiple myeloma ([place]) Staging form: Plasma Cell Myeloma and Plasma Cell Disorders, AJCC 8th Edition - Clinical stage from 05/03/2017: Beta-2-microglobulin (mg/L): 3.5, Albumin (g/dL): 3.4, ISS: Stage II, High-risk cytogenetics: Absent, LDH: Not assessed - Signed by Baird Cancer, PA-C on 05/03/2017    INTERVAL HISTORY:  Mr. Biven 74 y.o. male seen for follow-up of multiple myeloma, breast cancer, anemia.  Reports stable numbness and tingling in the toes.  Some shortness of breath on exertion is present.  Appetite is 100%.  Energy levels are 75%.  No new pains reported.  Received first injection of COVID-19.  Denies any nausea vomiting diarrhea or constipation.  REVIEW OF SYSTEMS:  Review of Systems  Respiratory: Positive for shortness of breath.   Neurological: Positive for numbness.  All other systems reviewed and are negative.    PAST MEDICAL/SURGICAL HISTORY:  Past Medical History:  Diagnosis Date  . Anxiety   . Bone metastases ([place]) 09/10/2016  . Breast cancer ([place]) 2011   Stave IV breast cancer; radiation and tamoxifen  . Breast cancer, male ([place])    Stave IV breast cancer; radiation and tamoxifen  Overview:  Left breast ca with mets bone  Overview:  METS TO BONE  . GERD (gastroesophageal reflux disease)   . Hypertension   . Macular degeneration   . Multiple myeloma ([place]) 02/18/2017  . Peripheral neuropathy    Past Surgical History:  Procedure Laterality Date  . BACK SURGERY    . CATARACT EXTRACTION W/PHACO Left 02/03/2019   Procedure: CATARACT EXTRACTION PHACO AND INTRAOCULAR LENS PLACEMENT ([place]);  Surgeon: Baruch Goldmann, MD;  Location: [facility];  Service: Ophthalmology;  Laterality: Left;  CDE: 15.89  . HERNIA REPAIR    . PORTACATH PLACEMENT Left 06/10/2018   Procedure: INSERTION PORT-A-CATH;  Surgeon: Virl Cagey, MD;  Location: [facility];  Service: General;  Laterality: Left;  . right arm surgery       SOCIAL HISTORY:  Social History   Socioeconomic  History  . Marital status: Married    Spouse name: Not on file  . Number of children: 3  . Years of education: Not on file  . Highest education level: Not on file  Occupational History  . Not on file  Tobacco Use  . Smoking status: Never Smoker  . Smokeless tobacco: Former Network engineer and Sexual Activity  . Alcohol use: Yes    Alcohol/week: 24.0 standard drinks    Types: 24 Cans of beer per week  . Drug use: No  . Sexual activity: Not on file  Other Topics Concern  . Not on file  Social History Narrative  . Not on file   Social Determinants of Health   Financial Resource Strain:   . Difficulty of Paying Living Expenses: Not on file  Food Insecurity:   . Worried About Charity fundraiser in the Last Year: Not on file  . Ran Out of Food in the Last Year: Not on file  Transportation Needs:   . Lack of Transportation (Medical): Not on file  . Lack of Transportation (Non-Medical): Not on file  Physical Activity:   . Days of Exercise per Week: Not on file  . Minutes of Exercise per Session: Not on file  Stress:   . Feeling of Stress :  Not on file  Social Connections:   . Frequency of Communication with Friends and Family: Not on file  . Frequency of Social Gatherings with Friends and Family: Not on file  . Attends Religious Services: Not on file  . Active Member of Clubs or Organizations: Not on file  . Attends Archivist Meetings: Not on file  . Marital Status: Not on file  Intimate Partner Violence:   . Fear of Current or Ex-Partner: Not on file  . Emotionally Abused: Not on file  . Physically Abused: Not on file  . Sexually Abused: Not on file    FAMILY HISTORY:  Family History  Problem Relation Age of Onset  . Stroke Mother   . Cancer Maternal Aunt        cancer NOS; died in her 89s  . Lung cancer Maternal Uncle        smoker    CURRENT MEDICATIONS:  Outpatient Encounter Medications as of 02/28/2020  Medication Sig Note  . ALPRAZolam (XANAX)  0.5 MG tablet TAKE 1 TABLET BY MOUTH AT BEDTIME AS NEEDED FOR ANXIETY OR SLEEP   . acyclovir (ZOVIRAX) 800 MG tablet TAKE 1 TABLET TWICE DAILY   . amLODipine (NORVASC) 5 MG tablet Take 1 tablet (5 mg total) by mouth daily.   Marland Kitchen aspirin EC 81 MG tablet Take 81 mg by mouth daily.   Marland Kitchen augmented betamethasone dipropionate (DIPROLENE-AF) 0.05 % cream Apply topically as needed.    . calcium-vitamin D (OSCAL WITH D) 500-200 MG-UNIT TABS tablet Take 1 tablet by mouth 2 (two) times daily.   Marland Kitchen CARFILZOMIB IV Inject into the vein.   . Cholecalciferol (VITAMIN D) 50 MCG (2000 UT) CAPS Take 4,000 Units by mouth 2 (two) times daily.   Marland Kitchen denosumab (XGEVA) 120 MG/1.7ML SOLN injection Inject 120 mg into the skin once. 01/18/2019: [place]   . dexamethasone (DECADRON) 4 MG tablet Take 5 tablets (20 mg total) by mouth once a week. Take 5 tablets the day of treatment.   . diclofenac Sodium (VOLTAREN) 1 % GEL Use 1 to 2 times per day as directed by doctor   . folic acid (FOLVITE) 1 MG tablet TAKE 1 TABLET BY MOUTH DAILY   . gabapentin (NEURONTIN) 300 MG capsule Take 1 capsule (300 mg total) by mouth 2 (two) times daily.   . hydrALAZINE (APRESOLINE) 50 MG tablet Take 1 tablet (50 mg total) by mouth every 8 (eight) hours. (Patient taking differently: Take 50 mg by mouth 2 (two) times daily. )   . loratadine (CLARITIN) 10 MG tablet Take 1 tablet (10 mg total) by mouth daily.   . metoprolol succinate (TOPROL-XL) 100 MG 24 hr tablet Take 100 mg by mouth daily.    . Multiple Vitamin (MULTIVITAMIN WITH MINERALS) TABS tablet Take 1 tablet by mouth daily.   . ondansetron (ZOFRAN) 8 MG tablet TAKE ONE TABLET BY MOUTH EVERY 8 HOURS AS NEEDED (Patient not taking: Reported on 01/09/2020)   . pantoprazole (PROTONIX) 40 MG tablet Take 40 mg by mouth every other day.    . polyethylene glycol (MIRALAX / GLYCOLAX) packet Take 17 g by mouth daily as needed for mild constipation. (Patient not taking: Reported on 01/09/2020)   .  prochlorperazine (COMPAZINE) 10 MG tablet Take 1 tablet (10 mg total) by mouth every 6 (six) hours as needed for nausea or vomiting. (Patient not taking: Reported on 01/09/2020)   . pseudoephedrine-guaifenesin (MUCINEX D) 60-600 MG 12 hr tablet Take 1 tablet  by mouth 2 (two) times daily as needed for congestion.    . tamoxifen (NOLVADEX) 20 MG tablet TAKE 1 TABLET (20 MG TOTAL) BY MOUTH DAILY.   . vitamin B-12 (CYANOCOBALAMIN) 1000 MCG tablet Take 1,000 mcg by mouth daily.   . [DISCONTINUED] ALPRAZolam (XANAX) 0.5 MG tablet TAKE 1 TABLET BY MOUTH AT BEDTIME AS NEEDED FOR ANXIETY OR SLEEP    No facility-administered encounter medications on file as of 02/28/2020.    ALLERGIES:  Allergies  Allergen Reactions  . Cefepime     Suspected severe thrombocytopenia is a result of cefepime induced antigen platelet destruction     PHYSICAL EXAM:  ECOG Performance status: 1  Vitals:   02/28/20 0826  BP: 134/68  Pulse: 80  Resp: 18  Temp: (!) 97.5 F (36.4 C)  SpO2: 99%   Filed Weights   02/28/20 0826  Weight: 222 lb (100.7 kg)    Physical Exam Constitutional:      Appearance: Normal appearance.  HENT:     Head: Normocephalic.     Nose: Nose normal.     Mouth/Throat:     Mouth: Mucous membranes are moist.     Pharynx: Oropharynx is clear.  Eyes:     Extraocular Movements: Extraocular movements intact.     Conjunctiva/sclera: Conjunctivae normal.  Cardiovascular:     Rate and Rhythm: Normal rate and regular rhythm.     Pulses: Normal pulses.     Heart sounds: Normal heart sounds.  Pulmonary:     Effort: Pulmonary effort is normal.     Breath sounds: Normal breath sounds.  Abdominal:     General: Bowel sounds are normal.     Palpations: Abdomen is soft.  Musculoskeletal:        General: Normal range of motion.     Cervical back: Normal range of motion.  Skin:    General: Skin is warm and dry.  Neurological:     General: No focal deficit present.     Mental Status: He is  alert and oriented to person, place, and time. Mental status is at baseline.  Psychiatric:        Mood and Affect: Mood normal.        Behavior: Behavior normal.        Thought Content: Thought content normal.        Judgment: Judgment normal.      LABORATORY DATA:  I have reviewed the labs as listed.  CBC    Component Value Date/Time   WBC 4.2 02/27/2020 1439   RBC 2.96 (L) 02/27/2020 1439   HGB 10.5 (L) 02/27/2020 1439   HCT 32.6 (L) 02/27/2020 1439   PLT 179 02/27/2020 1439   MCV 110.1 (H) 02/27/2020 1439   MCH 35.5 (H) 02/27/2020 1439   MCHC 32.2 02/27/2020 1439   RDW 13.4 02/27/2020 1439   LYMPHSABS 1.2 02/27/2020 1439   MONOABS 0.6 02/27/2020 1439   EOSABS 0.1 02/27/2020 1439   BASOSABS 0.0 02/27/2020 1439   CMP Latest Ref Rng & Units 02/27/2020 02/06/2020 01/23/2020  Glucose 70 - 99 mg/dL 104(H) 98 97  BUN 8 - 23 mg/dL _0 Creatinine 0.61 - 1.24 mg/dL 1.65(H) 1.75(H) 1.52(H)  Sodium 135 - 145 mmol/L 136 140 138  Potassium 3.5 - 5.1 mmol/L 4.0 4.1 4.0  Chloride 98 - 111 mmol/L 104 106 106  CO2 22 - 32 mmol/L _1 Calcium 8.9 - 10.3 mg/dL 8.7(L) 9.4 8.9  Total Protein 6.5 -  8.1 g/dL 6.4(L) 6.3(L) 5.9(L)  Total Bilirubin 0.3 - 1.2 mg/dL 0.3 0.5 0.6  Alkaline Phos 38 - 126 U/L 26(L) 26(L) 26(L)  AST 15 - 41 U/L _0 ALT 0 - 44 U/L _1 Radiology: I have independently reviewed scans and discussed with the patient.  ASSESSMENT & PLAN:   Multiple myeloma ([place]) 1.  Relapsed multiple myeloma: -Most recent progression on single agent carfilzomib and dexamethasone. -Bone marrow biopsy on 02/14/2020 showed plasma cell myeloma, kappa light chain restricted, plasma cells 60 to 70%.  FISH pending. 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 -PET scan on 02/13/2020 reviewed by me showed left supraclavicular lymph node.  Multiple bone lesions largely improved from PET scan from 2018.  Residual lesions in the right acromion, left acromion, thyroid  cartilage and right sternum, bilateral ribs and thoracolumbar spine and bilateral pelvis. -We talked about new treatment including daratumumab, bortezomib and dexamethasone for 2 cycles.  If there is no improvement we will consider switching bortezomib to pomalidomide. -Today we reviewed his labs.  They are adequate to proceed with his treatment.  We also reviewed most recent myeloma panel. -We talked about side effects of daratumumab and possible reactions.  We will watch him after his first injection.  He will proceed with cycle 1 day 1 today.  2.  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  3.  ID prophylaxis: -He will continue acyclovir twice daily.  4.  Peripheral neuropathy: -Continue gabapentin 300 mg twice daily.  5.  Myeloma bone disease: -We have held his denosumab for questionable ONJ. -However he does not have any jawbone problem.  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 -Has we will restart him on denosumab.   Orders placed this encounter:  No orders of the defined types were placed in this encounter.     Derek Jack, MD [place] [phone]

## 2020-02-29 ENCOUNTER — Inpatient Hospital Stay (HOSPITAL_COMMUNITY): Payer: Medicare Other

## 2020-02-29 VITALS — BP 130/67 | HR 77 | Temp 97.1°F | Resp 18

## 2020-02-29 DIAGNOSIS — Z5111 Encounter for antineoplastic chemotherapy: Secondary | ICD-10-CM | POA: Diagnosis not present

## 2020-02-29 DIAGNOSIS — C9000 Multiple myeloma not having achieved remission: Secondary | ICD-10-CM

## 2020-02-29 DIAGNOSIS — C50922 Malignant neoplasm of unspecified site of left male breast: Secondary | ICD-10-CM | POA: Diagnosis not present

## 2020-02-29 DIAGNOSIS — C7951 Secondary malignant neoplasm of bone: Secondary | ICD-10-CM

## 2020-02-29 DIAGNOSIS — Z5112 Encounter for antineoplastic immunotherapy: Secondary | ICD-10-CM | POA: Diagnosis not present

## 2020-02-29 DIAGNOSIS — N179 Acute kidney failure, unspecified: Secondary | ICD-10-CM

## 2020-02-29 DIAGNOSIS — G629 Polyneuropathy, unspecified: Secondary | ICD-10-CM | POA: Diagnosis not present

## 2020-02-29 MED ORDER — DENOSUMAB 120 MG/1.7ML ~~LOC~~ SOLN
120.0000 mg | Freq: Once | SUBCUTANEOUS | Status: AC
  Administered 2020-02-29: 120 mg via SUBCUTANEOUS
  Filled 2020-02-29: qty 1.7

## 2020-02-29 NOTE — Patient Instructions (Signed)
[place] at [facility]  Discharge Instructions:  Johnny Lucas injection received today. _______________________________________________________________  Thank you for choosing [place] at [facility] to provide your oncology and hematology care.  To afford each patient quality time with our providers, please arrive at least 15 minutes before your scheduled appointment.  You need to re-schedule your appointment if you arrive 10 or more minutes late.  We strive to give you quality time with our providers, and arriving late affects you and other patients whose appointments are after yours.  Also, if you no show three or more times for appointments you may be dismissed from the clinic.  Again, thank you for choosing [name] at [place] hope is that these requests will allow you access to exceptional care and in a timely manner. _______________________________________________________________  If you have questions after your visit, please contact our office at [phone] between the hours of 8:30 a.m. and 5:00 p.m. Voicemails left after 4:30 p.m. will not be returned until the following business day. _______________________________________________________________  For prescription refill requests, have your pharmacy contact our office. _______________________________________________________________  Recommendations made by the consultant and any test results will be sent to your referring physician. _______________________________________________________________

## 2020-02-29 NOTE — Progress Notes (Signed)
Johnny Lucas presents today for denosumab injection. Lab work reviewed prior to administration. VSS. Pt reports taking Ca and Vit D as instructed. Pt denies tooth/jaw pain and denies recent or future invasive dental work. Injection tolerated well, see MAR for details. Site clean and dry, band aid applied. 24 post chemotherapy, pt states he feels great and has had no side effects or issues overnight. Written information on Daratumumab and Velcade given to patient to review at home. Reminded to call for any questions or concerns. Pt discharged in satisfactory condition with follow up instructions.

## 2020-03-02 NOTE — Assessment & Plan Note (Signed)
1.  Relapsed multiple myeloma: -Most recent progression on single agent carfilzomib and dexamethasone. -Bone marrow biopsy on 02/14/2020 showed plasma cell myeloma, kappa light chain restricted, plasma cells 60 to 70%.  FISH pending. 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 -PET scan on 02/13/2020 reviewed by me showed left supraclavicular lymph node.  Multiple bone lesions largely improved from PET scan from 2018.  Residual lesions in the right acromion, left acromion, thyroid cartilage and right sternum, bilateral ribs and thoracolumbar spine and bilateral pelvis. -We talked about new treatment including daratumumab, bortezomib and dexamethasone for 2 cycles.  If there is no improvement we will consider switching bortezomib to pomalidomide. -Today we reviewed his labs.  They are adequate to proceed with his treatment.  We also reviewed most recent myeloma panel. -We talked about side effects of daratumumab and possible reactions.  We will watch him after his first injection.  He will proceed with cycle 1 day 1 today.  2.  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  3.  ID prophylaxis: -He will continue acyclovir twice daily.  4.  Peripheral neuropathy: -Continue gabapentin 300 mg twice daily.  5.  Myeloma bone disease: -We have held his denosumab for questionable ONJ. -However he does not have any jawbone problem.  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 -Has we will restart him on denosumab.

## 2020-03-04 ENCOUNTER — Other Ambulatory Visit (HOSPITAL_COMMUNITY): Payer: Medicare Other

## 2020-03-05 ENCOUNTER — Ambulatory Visit (HOSPITAL_COMMUNITY): Payer: Medicare Other | Admitting: Hematology

## 2020-03-05 ENCOUNTER — Inpatient Hospital Stay (HOSPITAL_COMMUNITY): Payer: Medicare Other

## 2020-03-05 ENCOUNTER — Inpatient Hospital Stay (HOSPITAL_BASED_OUTPATIENT_CLINIC_OR_DEPARTMENT_OTHER): Payer: Medicare Other | Admitting: Hematology

## 2020-03-05 ENCOUNTER — Ambulatory Visit (HOSPITAL_COMMUNITY): Payer: Medicare Other

## 2020-03-05 ENCOUNTER — Other Ambulatory Visit (HOSPITAL_COMMUNITY): Payer: Medicare Other

## 2020-03-05 ENCOUNTER — Other Ambulatory Visit: Payer: Self-pay

## 2020-03-05 VITALS — BP 118/68 | HR 74 | Temp 97.7°F | Resp 18

## 2020-03-05 DIAGNOSIS — C9000 Multiple myeloma not having achieved remission: Secondary | ICD-10-CM

## 2020-03-05 DIAGNOSIS — Z5111 Encounter for antineoplastic chemotherapy: Secondary | ICD-10-CM | POA: Diagnosis not present

## 2020-03-05 DIAGNOSIS — G629 Polyneuropathy, unspecified: Secondary | ICD-10-CM | POA: Diagnosis not present

## 2020-03-05 DIAGNOSIS — C7951 Secondary malignant neoplasm of bone: Secondary | ICD-10-CM | POA: Diagnosis not present

## 2020-03-05 DIAGNOSIS — Z5112 Encounter for antineoplastic immunotherapy: Secondary | ICD-10-CM | POA: Diagnosis not present

## 2020-03-05 DIAGNOSIS — C50922 Malignant neoplasm of unspecified site of left male breast: Secondary | ICD-10-CM | POA: Diagnosis not present

## 2020-03-05 LAB — COMPREHENSIVE METABOLIC PANEL WITH GFR
ALT: 13 U/L (ref 0–44)
AST: 15 U/L (ref 15–41)
Albumin: 3.7 g/dL (ref 3.5–5.0)
Alkaline Phosphatase: 25 U/L — ABNORMAL LOW (ref 38–126)
Anion gap: 10 (ref 5–15)
BUN: 21 mg/dL (ref 8–23)
CO2: 26 mmol/L (ref 22–32)
Calcium: 9.4 mg/dL (ref 8.9–10.3)
Chloride: 102 mmol/L (ref 98–111)
Creatinine, Ser: 1.81 mg/dL — ABNORMAL HIGH (ref 0.61–1.24)
GFR calc Af Amer: 43 mL/min — ABNORMAL LOW
GFR calc non Af Amer: 37 mL/min — ABNORMAL LOW
Glucose, Bld: 125 mg/dL — ABNORMAL HIGH (ref 70–99)
Potassium: 4 mmol/L (ref 3.5–5.1)
Sodium: 138 mmol/L (ref 135–145)
Total Bilirubin: 0.5 mg/dL (ref 0.3–1.2)
Total Protein: 6.1 g/dL — ABNORMAL LOW (ref 6.5–8.1)

## 2020-03-05 LAB — CBC WITH DIFFERENTIAL/PLATELET
Abs Immature Granulocytes: 0.01 K/uL (ref 0.00–0.07)
Basophils Absolute: 0 K/uL (ref 0.0–0.1)
Basophils Relative: 0 %
Eosinophils Absolute: 0.3 K/uL (ref 0.0–0.5)
Eosinophils Relative: 5 %
HCT: 30.8 % — ABNORMAL LOW (ref 39.0–52.0)
Hemoglobin: 10.1 g/dL — ABNORMAL LOW (ref 13.0–17.0)
Immature Granulocytes: 0 %
Lymphocytes Relative: 15 %
Lymphs Abs: 0.8 K/uL (ref 0.7–4.0)
MCH: 35.1 pg — ABNORMAL HIGH (ref 26.0–34.0)
MCHC: 32.8 g/dL (ref 30.0–36.0)
MCV: 106.9 fL — ABNORMAL HIGH (ref 80.0–100.0)
Monocytes Absolute: 0.5 K/uL (ref 0.1–1.0)
Monocytes Relative: 9 %
Neutro Abs: 3.8 K/uL (ref 1.7–7.7)
Neutrophils Relative %: 71 %
Platelets: 166 K/uL (ref 150–400)
RBC: 2.88 MIL/uL — ABNORMAL LOW (ref 4.22–5.81)
RDW: 13.2 % (ref 11.5–15.5)
WBC: 5.4 K/uL (ref 4.0–10.5)
nRBC: 0 % (ref 0.0–0.2)

## 2020-03-05 LAB — LACTATE DEHYDROGENASE: LDH: 120 U/L (ref 98–192)

## 2020-03-05 MED ORDER — DARATUMUMAB-HYALURONIDASE-FIHJ 1800-30000 MG-UT/15ML ~~LOC~~ SOLN
1800.0000 mg | Freq: Once | SUBCUTANEOUS | Status: AC
  Administered 2020-03-05: 12:00:00 1800 mg via SUBCUTANEOUS
  Filled 2020-03-05: qty 15

## 2020-03-05 MED ORDER — DIPHENHYDRAMINE HCL 25 MG PO CAPS
50.0000 mg | ORAL_CAPSULE | Freq: Once | ORAL | Status: AC
  Administered 2020-03-05: 11:00:00 50 mg via ORAL
  Filled 2020-03-05: qty 2

## 2020-03-05 MED ORDER — DEXAMETHASONE 4 MG PO TABS: 20.0000 mg | ORAL_TABLET | Freq: Once | ORAL | Status: DC

## 2020-03-05 MED ORDER — BORTEZOMIB CHEMO SQ INJECTION 3.5 MG (2.5MG/ML)
1.3000 mg/m2 | Freq: Once | INTRAMUSCULAR | Status: AC
  Administered 2020-03-05: 12:00:00 2.75 mg via SUBCUTANEOUS
  Filled 2020-03-05: qty 1.1

## 2020-03-05 MED ORDER — ACETAMINOPHEN 325 MG PO TABS
650.0000 mg | ORAL_TABLET | Freq: Once | ORAL | Status: AC
  Administered 2020-03-05: 650 mg via ORAL
  Filled 2020-03-05: qty 2

## 2020-03-05 MED ORDER — MONTELUKAST SODIUM 10 MG PO TABS
10.0000 mg | ORAL_TABLET | Freq: Once | ORAL | Status: AC
  Administered 2020-03-05: 10 mg via ORAL
  Filled 2020-03-05: qty 1

## 2020-03-05 NOTE — Patient Instructions (Signed)
[place] at [facility] Discharge Instructions  You were seen today by Dr. Delton Coombes. He went over your recent lab results. He will see you back in 2 week for labs, treatment and follow up.   Thank you for choosing [place] at [facility] to provide your oncology and hematology care.  To afford each patient quality time with our provider, please arrive at least 15 minutes before your scheduled appointment time.   If you have a lab appointment with the [place] please come in thru the  Main Entrance and check in at the main information desk  You need to re-schedule your appointment should you arrive 10 or more minutes late.  We strive to give you quality time with our providers, and arriving late affects you and other patients whose appointments are after yours.  Also, if you no show three or more times for appointments you may be dismissed from the clinic at the providers discretion.     Again, thank you for choosing [facility].  Our hope is that these requests will decrease the amount of time that you wait before being seen by our physicians.       _____________________________________________________________  Should you have questions after your visit to [facility], please contact our office at [phone] between the hours of 8:00 a.m. and 4:30 p.m.  Voicemails left after 4:00 p.m. will not be returned until the following business day.  For prescription refill requests, have your pharmacy contact our office and allow 72 hours.    Cancer Center Support Programs:   > Cancer Support Group  2nd Tuesday of the month 1pm-2pm, Journey Room

## 2020-03-05 NOTE — Progress Notes (Signed)
Patient has been assessed, vital signs and labs have been reviewed by Dr. Katragadda. ANC, Creatinine, LFTs, and Platelets are within treatment parameters per Dr. Katragadda. The patient is good to proceed with treatment at this time.  

## 2020-03-05 NOTE — Progress Notes (Signed)
1115 Labs reviewed with and pt seen by Dr. Delton Coombes and pt approved for Darzalex and Velcade injections today per MD          [place] tolerated Darzalex and Velcade injections well without complaints or incident. VSS upon discharge. Pt discharged self ambulatory in satisfactory condition

## 2020-03-05 NOTE — Patient Instructions (Signed)
[facility] Discharge Instructions for Patients Receiving Chemotherapy   Beginning January 23rd 2017 lab work for the [facility] will be done in the  Main lab at [facility] on 1st floor. If you have a lab appointment with the [facility] please come in thru the  Main Entrance and check in at the main information desk   Today you received the following chemotherapy agents Velcade and Darzalex injections. Follow-up as scheduled. Call clinic for any questions or concerns  To help prevent nausea and vomiting after your treatment, we encourage you to take your nausea medication   If you develop nausea and vomiting, or diarrhea that is not controlled by your medication, call the clinic.  The clinic phone number is [phone]. Office hours are Monday-Friday 8:30am-5:00pm.  BELOW ARE SYMPTOMS THAT SHOULD BE REPORTED IMMEDIATELY:  *FEVER GREATER THAN 101.0 F  *CHILLS WITH OR WITHOUT FEVER  NAUSEA AND VOMITING THAT IS NOT CONTROLLED WITH YOUR NAUSEA MEDICATION  *UNUSUAL SHORTNESS OF BREATH  *UNUSUAL BRUISING OR BLEEDING  TENDERNESS IN MOUTH AND THROAT WITH OR WITHOUT PRESENCE OF ULCERS  *URINARY PROBLEMS  *BOWEL PROBLEMS  UNUSUAL RASH Items with * indicate a potential emergency and should be followed up as soon as possible. If you have an emergency after office hours please contact your primary care physician or go to the nearest emergency department.  Please call the clinic during office hours if you have any questions or concerns.   You may also contact the Patient Navigator at [phone] should you have any questions or need assistance in obtaining follow up care.      Resources For Cancer Patients and their Caregivers ? American Cancer Society: Can assist with transportation, wigs, general needs, runs Look Good Feel Better.        8475242054 ? Cancer Care: Provides financial assistance, online support groups, medication/co-pay assistance.   1-800-813-HOPE 862-702-3037) ? [facility] Assists [place] cancer patients and their families through emotional , educational and financial support.  669-382-6393 ? [place] DSS Where to apply for food stamps, Medicaid and utility assistance. (979)626-7008 ? RCATS: Transportation to medical appointments. 820-591-3719 ? Social Security Administration: May apply for disability if have a Stage IV cancer. 3392347689 [phone] ? [employer], Disability and Transit Services: Assists with nutrition, care and transit needs. [phone]

## 2020-03-05 NOTE — Assessment & Plan Note (Addendum)
1.  Relapsed multiple myeloma: -Cycle 1 of daratumumab, bortezomib and dexamethasone started on 02/28/2020. -PET scan from 02/18/2020 showed left supraclavicular lymph node and largely improved diffuse bone lesions.. -He felt queasy for couple of days but denied any vomiting. -We reviewed his CBC and LFTs today.  Creatinine is 1.81. -He will proceed with his treatment today.  I plan to see him back in 2 weeks prior to start of cycle 2. -We plan to continue this treatment for 2 cycles.  If there is no improvement we will consider changing bortezomib to pomalidomide.  2.  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  3.  ID prophylaxis: -He will continue acyclovir twice daily.  4.  Peripheral neuropathy: -Continue gabapentin 300 mg twice daily.  5.  Myeloma bone disease: -Denosumab started back on 03/02/2020.

## 2020-03-05 NOTE — Progress Notes (Signed)
[place] [address] [place], [place] [address]   CLINIC:  Medical Oncology/Hematology  PCP:  [name], MD [place] [address] [phone]   REASON FOR VISIT:  Follow-up for Multiple Myeloma  CURRENT THERAPY: Daratumumab, Velcade and dexamethasone.  BRIEF ONCOLOGIC HISTORY:  Oncology History  Breast cancer, male ([place])  12/31/2009 Initial Biopsy   Biopsy of L breast    12/31/2009 Pathology Results   Invasive ductal carcinoma, ER/PR+, HER 2 negative   12/31/2009 Imaging   Ultrasound showing a 2.43 x 1.85 x 3 cm hypoechoic spiculated mass in the 12 o clock L breast retroareolar region   01/01/2010 -  Anti-estrogen oral therapy   Tamoxifen 20 mg daily   01/06/2010 Imaging   Bone scan abnormal uptake in the diaphysis of the R humerus, abnormal in the R third, fifth and sixth ribs, lesion also noted in the sternum.   02/03/2010 Surgery   Rod placement and fixation of R humerus by Dr. [name]   02/05/2010 - 02/18/2010 Radiation Therapy   30Gy in 10 fractions of 3 Gy per fraction to R pathologic fracture   03/11/2010 -  Chemotherapy   Denosumab monthly, now every 3 months. Started at [facility]    06/09/2016 Imaging   Three hypermetabolic osseous lesions in the sternum, left ilium and right ilium, as discussed above, likely represent osseous metastases. At this time, these are not recognizable on the CT images. 2. No extra skeletal metastatic disease identified in the neck, chest, abdomen or pelvis.   10/13/2016 Progression   PET shows various new and enlarging osseous metastatic lesions with no definite extra osseous metastatic disease currently identified.    12/31/2016 Progression   1. Multifocal hypermetabolic osseous metastases throughout the axial and proximal appendicular skeleton, which are increased in size, number and metabolism since 10/13/2016 PET-CT. 2. 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 3. No additional sites of hypermetabolic metastatic disease. 4. Chronic right mastoid sinusitis. 5. Aortic atherosclerosis.  One vessel coronary atherosclerosis.   Multiple myeloma ([place])  02/12/2017 Bone Marrow Biopsy   The marrow was variably cellular with large peritrabecular aggregates of kappa restricted plasma cells (66% by aspirate, 30% by Cd138). Cytogenetics +11.    03/01/2017 - 06/29/2017 Chemotherapy   RVD    05/26/2017 Bone Marrow Biopsy   Performed at [facility]:  Plasma cell myeloma in a 30% cellularmarrow with decreased trilineage hematopoiesis and 42% kappalight chain restricted plasma cells on the aspirate smears andlarge aggregates on the core biopsy.    07/12/2017 - 09/01/2017 Chemotherapy   3 cycles of carfizolmib/cyclophosphamide/dexamethasone     10/08/2017 Bone Marrow Transplant   Autotransplant at [facility]   02/28/2020 -  Chemotherapy   The patient had daratumumab-hyaluronidase-fihj (DARZALEX FASPRO) 1800-30000 MG-UT/15ML chemo SQ injection 1,800 mg, 1,800 mg, Subcutaneous,  Once, 1 of 12 cycles Administration: 1,800 mg (02/28/2020), 1,800 mg (03/05/2020) bortezomib SQ (VELCADE) chemo injection 2.75 mg, 1.3 mg/m2 = 2.75 mg, Subcutaneous,  Once, 1 of 8 cycles Administration: 2.75 mg (02/28/2020), 2.75 mg (03/05/2020)  for chemotherapy treatment.    Multiple myeloma not having achieved remission ([place])  06/29/2017 Initial Diagnosis   Multiple myeloma not having achieved remission ([place])   02/28/2020 -  Chemotherapy   The patient had daratumumab-hyaluronidase-fihj (DARZALEX FASPRO) 1800-30000 MG-UT/15ML chemo SQ injection 1,800 mg, 1,800 mg, Subcutaneous,  Once, 1 of 12  cycles Administration: 1,800 mg (02/28/2020), 1,800 mg (03/05/2020) bortezomib SQ (VELCADE) chemo injection 2.75 mg, 1.3 mg/m2 = 2.75 mg, Subcutaneous,  Once, 1 of 8 cycles Administration: 2.75 mg (02/28/2020), 2.75  mg (03/05/2020)  for chemotherapy treatment.       CANCER STAGING: Cancer Staging Multiple myeloma ([place]) Staging form: Plasma Cell Myeloma and Plasma Cell Disorders, AJCC 8th Edition - Clinical stage from 05/03/2017: Beta-2-microglobulin (mg/L): 3.5, Albumin (g/dL): 3.4, ISS: Stage II, High-risk cytogenetics: Absent, LDH: Not assessed - Signed by [facility], PA-C on 05/03/2017    INTERVAL HISTORY:  Johnny Lucas 74 y.o. male seen for follow-up of multiple myeloma, breast cancer, anemia and neuropathy.  He started on new treatment last week.  He felt queasy for couple of days but denied any vomiting.  No diarrhea was reported.  Had some fatigue.  Numbness in the feet has been stable.  Appetite is 100%.  Energy levels are 75%.  Did not report any worsening of numbness.  REVIEW OF SYSTEMS:  Review of Systems  Constitutional: Positive for fatigue.  Neurological: Positive for numbness.  All other systems reviewed and are negative.    PAST MEDICAL/SURGICAL HISTORY:  Past Medical History:  Diagnosis Date  . Anxiety   . Bone metastases ([place]) 09/10/2016  . Breast cancer ([place]) 2011   Stave IV breast cancer; radiation and tamoxifen  . Breast cancer, male ([place])    Stave IV breast cancer; radiation and tamoxifen  Overview:  Left breast ca with mets bone  Overview:  METS TO BONE  . GERD (gastroesophageal reflux disease)   . Hypertension   . Macular degeneration   . Multiple myeloma ([place]) 02/18/2017  . Peripheral neuropathy    Past Surgical History:  Procedure Laterality Date  . BACK SURGERY    . CATARACT EXTRACTION W/PHACO Left 02/03/2019   Procedure: CATARACT EXTRACTION PHACO AND INTRAOCULAR LENS PLACEMENT ([place]);  Surgeon: Baruch Goldmann, MD;  Location: [facility];  Service: Ophthalmology;  Laterality: Left;  CDE: 15.89  . HERNIA REPAIR    . PORTACATH PLACEMENT Left 06/10/2018   Procedure: INSERTION PORT-A-CATH;  Surgeon: Virl Cagey, MD;  Location: [facility];  Service: General;   Laterality: Left;  . right arm surgery       SOCIAL HISTORY:  Social History   Socioeconomic History  . Marital status: Married    Spouse name: Not on file  . Number of children: 3  . Years of education: Not on file  . Highest education level: Not on file  Occupational History  . Not on file  Tobacco Use  . Smoking status: Never Smoker  . Smokeless tobacco: Former Network engineer and Sexual Activity  . Alcohol use: Yes    Alcohol/week: 24.0 standard drinks    Types: 24 Cans of beer per week  . Drug use: No  . Sexual activity: Not on file  Other Topics Concern  . Not on file  Social History Narrative  . Not on file   Social Determinants of Health   Financial Resource Strain:   . Difficulty of Paying Living Expenses: Not on file  Food Insecurity:   . Worried About Charity fundraiser in the Last Year: Not on file  . Ran Out of Food in the Last Year: Not on file  Transportation Needs:   . Lack of Transportation (Medical): Not on file  . Lack of Transportation (Non-Medical): Not on file  Physical Activity:   . Days of Exercise per Week: Not on  file  . Minutes of Exercise per Session: Not on file  Stress:   . Feeling of Stress : Not on file  Social Connections:   . Frequency of Communication with Friends and Family: Not on file  . Frequency of Social Gatherings with Friends and Family: Not on file  . Attends Religious Services: Not on file  . Active Member of Clubs or Organizations: Not on file  . Attends Archivist Meetings: Not on file  . Marital Status: Not on file  Intimate Partner Violence:   . Fear of Current or Ex-Partner: Not on file  . Emotionally Abused: Not on file  . Physically Abused: Not on file  . Sexually Abused: Not on file    FAMILY HISTORY:  Family History  Problem Relation Age of Onset  . Stroke Mother   . Cancer Maternal Aunt        cancer NOS; died in her 46s  . Lung cancer Maternal Uncle        smoker    CURRENT  MEDICATIONS:  Outpatient Encounter Medications as of 03/05/2020  Medication Sig Note  . acyclovir (ZOVIRAX) 800 MG tablet TAKE 1 TABLET TWICE DAILY   . amLODipine (NORVASC) 5 MG tablet Take 1 tablet (5 mg total) by mouth daily.   Marland Kitchen aspirin EC 81 MG tablet Take 81 mg by mouth daily.   . calcium-vitamin D (OSCAL WITH D) 500-200 MG-UNIT TABS tablet Take 1 tablet by mouth 2 (two) times daily.   Marland Kitchen CARFILZOMIB IV Inject into the vein.   . Cholecalciferol (VITAMIN D) 50 MCG (2000 UT) CAPS Take 4,000 Units by mouth 2 (two) times daily.   Marland Kitchen denosumab (XGEVA) 120 MG/1.7ML SOLN injection Inject 120 mg into the skin once. 01/18/2019: [place]   . dexamethasone (DECADRON) 4 MG tablet Take 5 tablets (20 mg total) by mouth once a week. Take 5 tablets the day of treatment.   . folic acid (FOLVITE) 1 MG tablet TAKE 1 TABLET BY MOUTH DAILY   . gabapentin (NEURONTIN) 300 MG capsule Take 1 capsule (300 mg total) by mouth 2 (two) times daily.   . hydrALAZINE (APRESOLINE) 50 MG tablet Take 1 tablet (50 mg total) by mouth every 8 (eight) hours. (Patient taking differently: Take 50 mg by mouth 2 (two) times daily. )   . loratadine (CLARITIN) 10 MG tablet Take 1 tablet (10 mg total) by mouth daily.   . metoprolol succinate (TOPROL-XL) 100 MG 24 hr tablet Take 100 mg by mouth daily.    . Multiple Vitamin (MULTIVITAMIN WITH MINERALS) TABS tablet Take 1 tablet by mouth daily.   . pantoprazole (PROTONIX) 40 MG tablet Take 40 mg by mouth every other day.    . tamoxifen (NOLVADEX) 20 MG tablet TAKE 1 TABLET (20 MG TOTAL) BY MOUTH DAILY.   . vitamin B-12 (CYANOCOBALAMIN) 1000 MCG tablet Take 1,000 mcg by mouth daily.   Marland Kitchen ALPRAZolam (XANAX) 0.5 MG tablet TAKE 1 TABLET BY MOUTH AT BEDTIME AS NEEDED FOR ANXIETY OR SLEEP (Patient not taking: Reported on 03/05/2020)   . augmented betamethasone dipropionate (DIPROLENE-AF) 0.05 % cream Apply topically as needed.    . diclofenac Sodium (VOLTAREN) 1 % GEL Use 1 to 2 times per day  as directed by doctor   . ondansetron (ZOFRAN) 8 MG tablet TAKE ONE TABLET BY MOUTH EVERY 8 HOURS AS NEEDED (Patient not taking: Reported on 01/09/2020)   . polyethylene glycol (MIRALAX / GLYCOLAX) packet Take 17 g by mouth  daily as needed for mild constipation. (Patient not taking: Reported on 01/09/2020)   . prochlorperazine (COMPAZINE) 10 MG tablet Take 1 tablet (10 mg total) by mouth every 6 (six) hours as needed for nausea or vomiting. (Patient not taking: Reported on 01/09/2020)   . pseudoephedrine-guaifenesin (MUCINEX D) 60-600 MG 12 hr tablet Take 1 tablet by mouth 2 (two) times daily as needed for congestion.    . [DISCONTINUED] ALPRAZolam (XANAX) 0.5 MG tablet TAKE 1 TABLET BY MOUTH AT BEDTIME AS NEEDED FOR ANXIETY OR SLEEP    No facility-administered encounter medications on file as of 03/05/2020.    ALLERGIES:  Allergies  Allergen Reactions  . Cefepime     Suspected severe thrombocytopenia is a result of cefepime induced antigen platelet destruction     PHYSICAL EXAM:  ECOG Performance status: 1  Vitals:   03/05/20 1047  BP: (!) 116/59  Pulse: 85  Resp: 18  Temp: (!) 97.5 F (36.4 C)  SpO2: 97%   Filed Weights   03/05/20 1047  Weight: 216 lb (98 kg)    Physical Exam Constitutional:      Appearance: Normal appearance.  HENT:     Head: Normocephalic.     Nose: Nose normal.     Mouth/Throat:     Mouth: Mucous membranes are moist.     Pharynx: Oropharynx is clear.  Eyes:     Extraocular Movements: Extraocular movements intact.     Conjunctiva/sclera: Conjunctivae normal.  Cardiovascular:     Rate and Rhythm: Normal rate and regular rhythm.     Pulses: Normal pulses.     Heart sounds: Normal heart sounds.  Pulmonary:     Effort: Pulmonary effort is normal.     Breath sounds: Normal breath sounds.  Abdominal:     General: Bowel sounds are normal.     Palpations: Abdomen is soft.  Musculoskeletal:        General: Normal range of motion.     Cervical back:  Normal range of motion.  Skin:    General: Skin is warm and dry.  Neurological:     General: No focal deficit present.     Mental Status: He is alert and oriented to person, place, and time. Mental status is at baseline.  Psychiatric:        Mood and Affect: Mood normal.        Behavior: Behavior normal.        Thought Content: Thought content normal.        Judgment: Judgment normal.      LABORATORY DATA:  I have reviewed the labs as listed.  CBC    Component Value Date/Time   WBC 5.4 03/05/2020 1023   RBC 2.88 (L) 03/05/2020 1023   HGB 10.1 (L) 03/05/2020 1023   HCT 30.8 (L) 03/05/2020 1023   PLT 166 03/05/2020 1023   MCV 106.9 (H) 03/05/2020 1023   MCH 35.1 (H) 03/05/2020 1023   MCHC 32.8 03/05/2020 1023   RDW 13.2 03/05/2020 1023   LYMPHSABS 0.8 03/05/2020 1023   MONOABS 0.5 03/05/2020 1023   EOSABS 0.3 03/05/2020 1023   BASOSABS 0.0 03/05/2020 1023   CMP Latest Ref Rng & Units 03/05/2020 02/27/2020 02/06/2020  Glucose 70 - 99 mg/dL 125(H) 104(H) 98  BUN 8 - 23 mg/dL _0 Creatinine 0.61 - 1.24 mg/dL 1.81(H) 1.65(H) 1.75(H)  Sodium 135 - 145 mmol/L 138 136 140  Potassium 3.5 - 5.1 mmol/L 4.0 4.0 4.1  Chloride 98 -  111 mmol/L 102 104 106  CO2 22 - 32 mmol/L _0 Calcium 8.9 - 10.3 mg/dL 9.4 8.7(L) 9.4  Total Protein 6.5 - 8.1 g/dL 6.1(L) 6.4(L) 6.3(L)  Total Bilirubin 0.3 - 1.2 mg/dL 0.5 0.3 0.5  Alkaline Phos 38 - 126 U/L 25(L) 26(L) 26(L)  AST 15 - 41 U/L _1 ALT 0 - 44 U/L _2 Radiology: I have independently reviewed the scans.  ASSESSMENT & PLAN:   Multiple myeloma ([place]) 1.  Relapsed multiple myeloma: -Cycle 1 of daratumumab, bortezomib and dexamethasone started on 02/28/2020. -PET scan from 02/18/2020 showed left supraclavicular lymph node and largely improved diffuse bone lesions.. -He felt queasy for couple of days but denied any vomiting. -We reviewed his CBC and LFTs today.  Creatinine is 1.81. -He will proceed with his treatment  today.  I plan to see him back in 2 weeks prior to start of cycle 2. -We plan to continue this treatment for 2 cycles.  If there is no improvement we will consider changing bortezomib to pomalidomide.  2.  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  3.  ID prophylaxis: -He will continue acyclovir twice daily.  4.  Peripheral neuropathy: -Continue gabapentin 300 mg twice daily.  5.  Myeloma bone disease: -Denosumab started back on 03/02/2020.   Orders placed this encounter:  No orders of the defined types were placed in this encounter.     Derek Jack, MD [place] [phone]

## 2020-03-06 ENCOUNTER — Other Ambulatory Visit (HOSPITAL_COMMUNITY): Payer: Self-pay | Admitting: Hematology

## 2020-03-06 LAB — KAPPA/LAMBDA LIGHT CHAINS
Kappa free light chain: 172.5 mg/L — ABNORMAL HIGH (ref 3.3–19.4)
Lambda free light chains: <1.5 mg/L — ABNORMAL LOW (ref 5.7–26.3)

## 2020-03-06 LAB — PROTEIN ELECTROPHORESIS, SERUM
A/G Ratio: 1.5 (ref 0.7–1.7)
Albumin ELP: 3.4 g/dL (ref 2.9–4.4)
Alpha-1-Globulin: 0.2 g/dL (ref 0.0–0.4)
Alpha-2-Globulin: 0.7 g/dL (ref 0.4–1.0)
Beta Globulin: 0.8 g/dL (ref 0.7–1.3)
Gamma Globulin: 0.5 g/dL (ref 0.4–1.8)
Globulin, Total: 2.3 g/dL (ref 2.2–3.9)
M-Spike, %: 0.4 g/dL — ABNORMAL HIGH
Total Protein ELP: 5.7 g/dL — ABNORMAL LOW (ref 6.0–8.5)

## 2020-03-07 ENCOUNTER — Other Ambulatory Visit (HOSPITAL_COMMUNITY): Payer: Self-pay | Admitting: Hematology

## 2020-03-08 ENCOUNTER — Other Ambulatory Visit: Payer: Self-pay

## 2020-03-08 ENCOUNTER — Inpatient Hospital Stay (HOSPITAL_COMMUNITY): Payer: Medicare Other

## 2020-03-08 VITALS — BP 127/74 | HR 76 | Temp 97.7°F | Resp 18

## 2020-03-08 DIAGNOSIS — C9000 Multiple myeloma not having achieved remission: Secondary | ICD-10-CM | POA: Diagnosis not present

## 2020-03-08 DIAGNOSIS — C7951 Secondary malignant neoplasm of bone: Secondary | ICD-10-CM | POA: Diagnosis not present

## 2020-03-08 DIAGNOSIS — C50922 Malignant neoplasm of unspecified site of left male breast: Secondary | ICD-10-CM | POA: Diagnosis not present

## 2020-03-08 DIAGNOSIS — Z5111 Encounter for antineoplastic chemotherapy: Secondary | ICD-10-CM | POA: Diagnosis not present

## 2020-03-08 DIAGNOSIS — G629 Polyneuropathy, unspecified: Secondary | ICD-10-CM | POA: Diagnosis not present

## 2020-03-08 DIAGNOSIS — Z5112 Encounter for antineoplastic immunotherapy: Secondary | ICD-10-CM | POA: Diagnosis not present

## 2020-03-08 MED ORDER — DEXAMETHASONE 4 MG PO TABS: 20.0000 mg | ORAL_TABLET | Freq: Once | ORAL | Status: DC

## 2020-03-08 MED ORDER — BORTEZOMIB CHEMO SQ INJECTION 3.5 MG (2.5MG/ML)
1.3000 mg/m2 | Freq: Once | INTRAMUSCULAR | Status: AC
  Administered 2020-03-08: 2.75 mg via SUBCUTANEOUS
  Filled 2020-03-08: qty 1.1

## 2020-03-08 NOTE — Progress Notes (Signed)
03/08/20  Ok to treat per Dr Delton Coombes.  V.O. Dr Rhys Martini, PharmD

## 2020-03-08 NOTE — Patient Instructions (Signed)
[facility] Discharge Instructions for Patients Receiving Chemotherapy   Beginning January 23rd 2017 lab work for the Cancer Center will be done in the  Main lab at [facility] on 1st floor. If you have a lab appointment with the Cancer Center please come in thru the  Main Entrance and check in at the main information desk   Today you received the following chemotherapy agents Velcade  To help prevent nausea and vomiting after your treatment, we encourage you to take your nausea medication    If you develop nausea and vomiting, or diarrhea that is not controlled by your medication, call the clinic.  The clinic phone number is [phone]. Office hours are Monday-Friday 8:30am-5:00pm.  BELOW ARE SYMPTOMS THAT SHOULD BE REPORTED IMMEDIATELY:  *FEVER GREATER THAN 101.0 F  *CHILLS WITH OR WITHOUT FEVER  NAUSEA AND VOMITING THAT IS NOT CONTROLLED WITH YOUR NAUSEA MEDICATION  *UNUSUAL SHORTNESS OF BREATH  *UNUSUAL BRUISING OR BLEEDING  TENDERNESS IN MOUTH AND THROAT WITH OR WITHOUT PRESENCE OF ULCERS  *URINARY PROBLEMS  *BOWEL PROBLEMS  UNUSUAL RASH Items with * indicate a potential emergency and should be followed up as soon as possible. If you have an emergency after office hours please contact your primary care physician or go to the nearest emergency department.  Please call the clinic during office hours if you have any questions or concerns.   You may also contact the Patient Navigator at [phone] should you have any questions or need assistance in obtaining follow up care.      Resources For Cancer Patients and their Caregivers ? American Cancer Society: Can assist with transportation, wigs, general needs, runs Look Good Feel Better.        1-888-227-6333 ? Cancer Care: Provides financial assistance, online support groups, medication/co-pay assistance.  1-800-813-HOPE (4673) ? [facility] Assists [place] cancer  patients and their families through emotional , educational and financial support.  336-427-4357 ? [place] DSS Where to apply for food stamps, Medicaid and utility assistance. 336-342-1394 ? [facility]: Transportation to medical appointments. 336-347-2287 ? Social Security Administration: May apply for disability if have a Stage IV cancer. 336-342-7796 1-800-772-1213 ? [place] [employer], Disability and Transit Services: Assists with nutrition, care and transit needs. [phone]          

## 2020-03-08 NOTE — Progress Notes (Signed)
3437- Lab results from 3/9 reviewed and within parameter for treatment. Pt reports since last treatment, he has had increasing fatigue and nausea on day 2 after his injections. He states he slept all day yesterday. Denies SOB, vomiting, diarrhea. He reports that he feels much better today.   [name] tolerated Velcade injections into right abdomen without incident or complaint. VSS. Discharged self ambulatory in satisfactory condition with follow up instructions for next week.

## 2020-03-12 ENCOUNTER — Other Ambulatory Visit: Payer: Self-pay

## 2020-03-12 ENCOUNTER — Ambulatory Visit (HOSPITAL_COMMUNITY): Payer: Medicare Other | Admitting: Hematology

## 2020-03-12 ENCOUNTER — Inpatient Hospital Stay (HOSPITAL_COMMUNITY): Payer: Medicare Other

## 2020-03-12 VITALS — BP 129/72 | HR 72 | Temp 96.9°F | Resp 18 | Wt 214.8 lb

## 2020-03-12 DIAGNOSIS — C9000 Multiple myeloma not having achieved remission: Secondary | ICD-10-CM

## 2020-03-12 DIAGNOSIS — Z5111 Encounter for antineoplastic chemotherapy: Secondary | ICD-10-CM | POA: Diagnosis not present

## 2020-03-12 DIAGNOSIS — C7951 Secondary malignant neoplasm of bone: Secondary | ICD-10-CM | POA: Diagnosis not present

## 2020-03-12 DIAGNOSIS — G629 Polyneuropathy, unspecified: Secondary | ICD-10-CM | POA: Diagnosis not present

## 2020-03-12 DIAGNOSIS — Z5112 Encounter for antineoplastic immunotherapy: Secondary | ICD-10-CM | POA: Diagnosis not present

## 2020-03-12 DIAGNOSIS — C50922 Malignant neoplasm of unspecified site of left male breast: Secondary | ICD-10-CM | POA: Diagnosis not present

## 2020-03-12 LAB — CBC WITH DIFFERENTIAL/PLATELET
Abs Immature Granulocytes: 0.03 10*3/uL (ref 0.00–0.07)
Basophils Absolute: 0 10*3/uL (ref 0.0–0.1)
Basophils Relative: 0 %
Eosinophils Absolute: 0.1 10*3/uL (ref 0.0–0.5)
Eosinophils Relative: 2 %
HCT: 32.5 % — ABNORMAL LOW (ref 39.0–52.0)
Hemoglobin: 10.6 g/dL — ABNORMAL LOW (ref 13.0–17.0)
Immature Granulocytes: 0 %
Lymphocytes Relative: 8 %
Lymphs Abs: 0.7 10*3/uL (ref 0.7–4.0)
MCH: 35.3 pg — ABNORMAL HIGH (ref 26.0–34.0)
MCHC: 32.6 g/dL (ref 30.0–36.0)
MCV: 108.3 fL — ABNORMAL HIGH (ref 80.0–100.0)
Monocytes Absolute: 0.5 10*3/uL (ref 0.1–1.0)
Monocytes Relative: 6 %
Neutro Abs: 7.4 10*3/uL (ref 1.7–7.7)
Neutrophils Relative %: 84 %
Platelets: 145 10*3/uL — ABNORMAL LOW (ref 150–400)
RBC: 3 MIL/uL — ABNORMAL LOW (ref 4.22–5.81)
RDW: 13.2 % (ref 11.5–15.5)
WBC: 8.8 10*3/uL (ref 4.0–10.5)
nRBC: 0 % (ref 0.0–0.2)

## 2020-03-12 LAB — COMPREHENSIVE METABOLIC PANEL
ALT: 15 U/L (ref 0–44)
AST: 15 U/L (ref 15–41)
Albumin: 3.7 g/dL (ref 3.5–5.0)
Alkaline Phosphatase: 25 U/L — ABNORMAL LOW (ref 38–126)
Anion gap: 6 (ref 5–15)
BUN: 24 mg/dL — ABNORMAL HIGH (ref 8–23)
CO2: 26 mmol/L (ref 22–32)
Calcium: 8.8 mg/dL — ABNORMAL LOW (ref 8.9–10.3)
Chloride: 106 mmol/L (ref 98–111)
Creatinine, Ser: 1.75 mg/dL — ABNORMAL HIGH (ref 0.61–1.24)
GFR calc Af Amer: 44 mL/min — ABNORMAL LOW (ref >60)
GFR calc non Af Amer: 38 mL/min — ABNORMAL LOW (ref >60)
Glucose, Bld: 102 mg/dL — ABNORMAL HIGH (ref 70–99)
Potassium: 4.4 mmol/L (ref 3.5–5.1)
Sodium: 138 mmol/L (ref 135–145)
Total Bilirubin: 0.7 mg/dL (ref 0.3–1.2)
Total Protein: 5.8 g/dL — ABNORMAL LOW (ref 6.5–8.1)

## 2020-03-12 MED ORDER — MONTELUKAST SODIUM 10 MG PO TABS
10.0000 mg | ORAL_TABLET | Freq: Once | ORAL | Status: AC
  Administered 2020-03-12: 10 mg via ORAL
  Filled 2020-03-12: qty 1

## 2020-03-12 MED ORDER — DIPHENHYDRAMINE HCL 25 MG PO CAPS
50.0000 mg | ORAL_CAPSULE | Freq: Once | ORAL | Status: AC
  Administered 2020-03-12: 50 mg via ORAL
  Filled 2020-03-12: qty 2

## 2020-03-12 MED ORDER — DARATUMUMAB-HYALURONIDASE-FIHJ 1800-30000 MG-UT/15ML ~~LOC~~ SOLN
1800.0000 mg | Freq: Once | SUBCUTANEOUS | Status: AC
  Administered 2020-03-12: 1800 mg via SUBCUTANEOUS
  Filled 2020-03-12: qty 15

## 2020-03-12 MED ORDER — DEXAMETHASONE 4 MG PO TABS: 20.0000 mg | ORAL_TABLET | Freq: Once | ORAL | Status: DC

## 2020-03-12 MED ORDER — ACETAMINOPHEN 325 MG PO TABS
650.0000 mg | ORAL_TABLET | Freq: Once | ORAL | Status: AC
  Administered 2020-03-12: 650 mg via ORAL
  Filled 2020-03-12: qty 2

## 2020-03-12 NOTE — Progress Notes (Signed)
0945- lab results reviewed including Cr. 1.75, which is trending down. Per MD, ok to proceed with treatment today.   Johnny Lucas tolerated Daratumumab injection into left abdomen without incident or complaint. Discharged self ambulatory in satisfactory condition.

## 2020-03-12 NOTE — Patient Instructions (Signed)
[facility] Discharge Instructions for Patients Receiving Chemotherapy   Beginning January 23rd 2017 lab work for the [facility] will be done in the  Main lab at [facility] on 1st floor. If you have a lab appointment with the [facility] please come in thru the  Main Entrance and check in at the main information desk   Today you received the following chemotherapy agents Daraumumab  To help prevent nausea and vomiting after your treatment, we encourage you to take your nausea medication    If you develop nausea and vomiting, or diarrhea that is not controlled by your medication, call the clinic.  The clinic phone number is [phone]. Office hours are Monday-Friday 8:30am-5:00pm.  BELOW ARE SYMPTOMS THAT SHOULD BE REPORTED IMMEDIATELY:  *FEVER GREATER THAN 101.0 F  *CHILLS WITH OR WITHOUT FEVER  NAUSEA AND VOMITING THAT IS NOT CONTROLLED WITH YOUR NAUSEA MEDICATION  *UNUSUAL SHORTNESS OF BREATH  *UNUSUAL BRUISING OR BLEEDING  TENDERNESS IN MOUTH AND THROAT WITH OR WITHOUT PRESENCE OF ULCERS  *URINARY PROBLEMS  *BOWEL PROBLEMS  UNUSUAL RASH Items with * indicate a potential emergency and should be followed up as soon as possible. If you have an emergency after office hours please contact your primary care physician or go to the nearest emergency department.  Please call the clinic during office hours if you have any questions or concerns.   You may also contact the Patient Navigator at [phone] should you have any questions or need assistance in obtaining follow up care.      Resources For Cancer Patients and their Caregivers ? American Cancer Society: Can assist with transportation, wigs, general needs, runs Look Good Feel Better.        479-561-5960 ? Cancer Care: Provides financial assistance, online support groups, medication/co-pay assistance.  1-800-813-HOPE 331-706-4825) ? [facility] Assists [place]  cancer patients and their families through emotional , educational and financial support.  (563) 523-7846 ? [place] DSS Where to apply for food stamps, Medicaid and utility assistance. (530) 686-9023 ? RCATS: Transportation to medical appointments. [phone] ? Social Security Administration: May apply for disability if have a Stage IV cancer. 229-136-8549 8471409340 ? [employer], Disability and Transit Services: Assists with nutrition, care and transit needs. [phone]

## 2020-03-14 NOTE — Progress Notes (Signed)

## 2020-03-15 ENCOUNTER — Ambulatory Visit (HOSPITAL_COMMUNITY): Payer: Medicare Other

## 2020-03-18 ENCOUNTER — Inpatient Hospital Stay (HOSPITAL_BASED_OUTPATIENT_CLINIC_OR_DEPARTMENT_OTHER): Payer: Medicare Other | Admitting: Hematology

## 2020-03-18 ENCOUNTER — Other Ambulatory Visit: Payer: Self-pay

## 2020-03-18 ENCOUNTER — Inpatient Hospital Stay (HOSPITAL_COMMUNITY): Payer: Medicare Other

## 2020-03-18 ENCOUNTER — Encounter (HOSPITAL_COMMUNITY): Payer: Self-pay | Admitting: Hematology

## 2020-03-18 VITALS — BP 127/68 | HR 73 | Temp 97.5°F | Resp 18 | Wt 217.2 lb

## 2020-03-18 DIAGNOSIS — C9000 Multiple myeloma not having achieved remission: Secondary | ICD-10-CM

## 2020-03-18 DIAGNOSIS — Z5112 Encounter for antineoplastic immunotherapy: Secondary | ICD-10-CM | POA: Diagnosis not present

## 2020-03-18 DIAGNOSIS — G629 Polyneuropathy, unspecified: Secondary | ICD-10-CM | POA: Diagnosis not present

## 2020-03-18 DIAGNOSIS — C50922 Malignant neoplasm of unspecified site of left male breast: Secondary | ICD-10-CM | POA: Diagnosis not present

## 2020-03-18 DIAGNOSIS — C7951 Secondary malignant neoplasm of bone: Secondary | ICD-10-CM | POA: Diagnosis not present

## 2020-03-18 DIAGNOSIS — Z5111 Encounter for antineoplastic chemotherapy: Secondary | ICD-10-CM | POA: Diagnosis not present

## 2020-03-18 LAB — COMPREHENSIVE METABOLIC PANEL
ALT: 16 U/L (ref 0–44)
AST: 15 U/L (ref 15–41)
Albumin: 3.6 g/dL (ref 3.5–5.0)
Alkaline Phosphatase: 26 U/L — ABNORMAL LOW (ref 38–126)
Anion gap: 8 (ref 5–15)
BUN: 22 mg/dL (ref 8–23)
CO2: 27 mmol/L (ref 22–32)
Calcium: 9.1 mg/dL (ref 8.9–10.3)
Chloride: 104 mmol/L (ref 98–111)
Creatinine, Ser: 1.74 mg/dL — ABNORMAL HIGH (ref 0.61–1.24)
GFR calc Af Amer: 45 mL/min — ABNORMAL LOW (ref >60)
GFR calc non Af Amer: 39 mL/min — ABNORMAL LOW (ref >60)
Glucose, Bld: 102 mg/dL — ABNORMAL HIGH (ref 70–99)
Potassium: 4.4 mmol/L (ref 3.5–5.1)
Sodium: 139 mmol/L (ref 135–145)
Total Bilirubin: 0.6 mg/dL (ref 0.3–1.2)
Total Protein: 5.9 g/dL — ABNORMAL LOW (ref 6.5–8.1)

## 2020-03-18 LAB — CBC WITH DIFFERENTIAL/PLATELET
Abs Immature Granulocytes: 0.01 10*3/uL (ref 0.00–0.07)
Basophils Absolute: 0 10*3/uL (ref 0.0–0.1)
Basophils Relative: 0 %
Eosinophils Absolute: 0.2 10*3/uL (ref 0.0–0.5)
Eosinophils Relative: 3 %
HCT: 30.7 % — ABNORMAL LOW (ref 39.0–52.0)
Hemoglobin: 10 g/dL — ABNORMAL LOW (ref 13.0–17.0)
Immature Granulocytes: 0 %
Lymphocytes Relative: 12 %
Lymphs Abs: 0.9 10*3/uL (ref 0.7–4.0)
MCH: 36.4 pg — ABNORMAL HIGH (ref 26.0–34.0)
MCHC: 32.6 g/dL (ref 30.0–36.0)
MCV: 111.6 fL — ABNORMAL HIGH (ref 80.0–100.0)
Monocytes Absolute: 0.5 10*3/uL (ref 0.1–1.0)
Monocytes Relative: 7 %
Neutro Abs: 5.8 10*3/uL (ref 1.7–7.7)
Neutrophils Relative %: 78 %
Platelets: 163 10*3/uL (ref 150–400)
RBC: 2.75 MIL/uL — ABNORMAL LOW (ref 4.22–5.81)
RDW: 13.6 % (ref 11.5–15.5)
WBC: 7.4 10*3/uL (ref 4.0–10.5)
nRBC: 0 % (ref 0.0–0.2)

## 2020-03-18 MED ORDER — DIPHENHYDRAMINE HCL 25 MG PO CAPS
ORAL_CAPSULE | ORAL | Status: AC
  Filled 2020-03-18: qty 2

## 2020-03-18 MED ORDER — BORTEZOMIB CHEMO SQ INJECTION 3.5 MG (2.5MG/ML)
1.3000 mg/m2 | Freq: Once | INTRAMUSCULAR | Status: AC
  Administered 2020-03-18: 12:00:00 2.75 mg via SUBCUTANEOUS
  Filled 2020-03-18: qty 1.1

## 2020-03-18 MED ORDER — DARATUMUMAB-HYALURONIDASE-FIHJ 1800-30000 MG-UT/15ML ~~LOC~~ SOLN
1800.0000 mg | Freq: Once | SUBCUTANEOUS | Status: AC
  Administered 2020-03-18: 12:00:00 1800 mg via SUBCUTANEOUS
  Filled 2020-03-18: qty 15

## 2020-03-18 MED ORDER — DIPHENHYDRAMINE HCL 25 MG PO CAPS
50.0000 mg | ORAL_CAPSULE | Freq: Once | ORAL | Status: AC
  Administered 2020-03-18: 50 mg via ORAL

## 2020-03-18 MED ORDER — ACETAMINOPHEN 325 MG PO TABS
650.0000 mg | ORAL_TABLET | Freq: Once | ORAL | Status: AC
  Administered 2020-03-18: 10:00:00 650 mg via ORAL

## 2020-03-18 MED ORDER — DEXAMETHASONE 4 MG PO TABS
ORAL_TABLET | ORAL | Status: AC
  Filled 2020-03-18: qty 5

## 2020-03-18 MED ORDER — ACETAMINOPHEN 325 MG PO TABS
ORAL_TABLET | ORAL | Status: AC
  Filled 2020-03-18: qty 2

## 2020-03-18 MED ORDER — DEXAMETHASONE 4 MG PO TABS: 20.0000 mg | ORAL_TABLET | Freq: Once | ORAL | Status: DC

## 2020-03-18 NOTE — Progress Notes (Signed)
[address], [place] [address]   CLINIC:  Medical Oncology/Hematology  PCP:  [name], MD [place] [address] [phone]   REASON FOR VISIT:  Follow-up for Multiple Myeloma  CURRENT THERAPY: Daratumumab, Velcade and dexamethasone.  BRIEF ONCOLOGIC HISTORY:  Oncology History  Breast cancer, male ([place])  12/31/2009 Initial Biopsy   Biopsy of L breast    12/31/2009 Pathology Results   Invasive ductal carcinoma, ER/PR+, HER 2 negative   12/31/2009 Imaging   Ultrasound showing a 2.43 x 1.85 x 3 cm hypoechoic spiculated mass in the 12 o clock L breast retroareolar region   01/01/2010 -  Anti-estrogen oral therapy   Tamoxifen 20 mg daily   01/06/2010 Imaging   Bone scan abnormal uptake in the diaphysis of the R humerus, abnormal in the R third, fifth and sixth ribs, lesion also noted in the sternum.   02/03/2010 Surgery   Rod placement and fixation of R humerus by Dr. [name]   02/05/2010 - 02/18/2010 Radiation Therapy   30Gy in 10 fractions of 3 Gy per fraction to R pathologic fracture   03/11/2010 -  Chemotherapy   Denosumab monthly, now every 3 months. Started at [name]    06/09/2016 Imaging   Three hypermetabolic osseous lesions in the sternum, left ilium and right ilium, as discussed above, likely represent osseous metastases. At this time, these are not recognizable on the CT images. 2. No extra skeletal metastatic disease identified in the neck, chest, abdomen or pelvis.   10/13/2016 Progression   PET shows various new and enlarging osseous metastatic lesions with no definite extra osseous metastatic disease currently identified.    12/31/2016 Progression   1. Multifocal hypermetabolic osseous metastases throughout the axial and proximal appendicular skeleton, which are increased in size, number and metabolism since 10/13/2016 PET-CT. 2. 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 3. No additional sites of hypermetabolic metastatic disease. 4. Chronic right mastoid sinusitis. 5. Aortic atherosclerosis.  One vessel coronary atherosclerosis.   Multiple myeloma ([place])  02/12/2017 Bone Marrow Biopsy   The marrow was variably cellular with large peritrabecular aggregates of kappa restricted plasma cells (66% by aspirate, 30% by Cd138). Cytogenetics +11.    03/01/2017 - 06/29/2017 Chemotherapy   RVD    05/26/2017 Bone Marrow Biopsy   Performed at [facility]:  Plasma cell myeloma in a 30% cellularmarrow with decreased trilineage hematopoiesis and 42% kappalight chain restricted plasma cells on the aspirate smears andlarge aggregates on the core biopsy.    07/12/2017 - 09/01/2017 Chemotherapy   3 cycles of carfizolmib/cyclophosphamide/dexamethasone     10/08/2017 Bone Marrow Transplant   Autotransplant at [facility]   02/28/2020 -  Chemotherapy   The patient had daratumumab-hyaluronidase-fihj (DARZALEX FASPRO) 1800-30000 MG-UT/15ML chemo SQ injection 1,800 mg, 1,800 mg, Subcutaneous,  Once, 2 of 12 cycles Administration: 1,800 mg (02/28/2020), 1,800 mg (03/05/2020), 1,800 mg (03/12/2020), 1,800 mg (03/18/2020) bortezomib SQ (VELCADE) chemo injection 2.75 mg, 1.3 mg/m2 = 2.75 mg, Subcutaneous,  Once, 2 of 8 cycles Administration: 2.75 mg (02/28/2020), 2.75 mg (03/05/2020), 2.75 mg (03/08/2020), 2.75 mg (03/18/2020)  for chemotherapy treatment.    Multiple myeloma not having achieved remission ([place])  06/29/2017 Initial Diagnosis   Multiple myeloma not having achieved remission ([place])   02/28/2020 -  Chemotherapy   The patient had daratumumab-hyaluronidase-fihj (DARZALEX FASPRO) 1800-30000 MG-UT/15ML chemo  SQ injection 1,800 mg, 1,800 mg, Subcutaneous,  Once, 2 of 12 cycles Administration: 1,800 mg (02/28/2020), 1,800 mg (03/05/2020), 1,800 mg (03/12/2020), 1,800 mg (03/18/2020) bortezomib SQ  (VELCADE) chemo injection 2.75 mg, 1.3 mg/m2 = 2.75 mg, Subcutaneous,  Once, 2 of 8 cycles Administration: 2.75 mg (02/28/2020), 2.75 mg (03/05/2020), 2.75 mg (03/08/2020), 2.75 mg (03/18/2020)  for chemotherapy treatment.       CANCER STAGING: Cancer Staging Multiple myeloma ([place]) Staging form: Plasma Cell Myeloma and Plasma Cell Disorders, AJCC 8th Edition - Clinical stage from 05/03/2017: Beta-2-microglobulin (mg/L): 3.5, Albumin (g/dL): 3.4, ISS: Stage II, High-risk cytogenetics: Absent, LDH: Not assessed - Signed by Baird Cancer, PA-C on 05/03/2017    INTERVAL HISTORY:  [name] 74 y.o. male seen for follow-up and toxicity assessment prior to cycle 2 for multiple myeloma.  Reports appetite of 100%.  Energy levels are 75%.  Numbness in the feet has been stable.  He is reportedly having COVID-19 second injection next Thursday.  Denies any fevers or infections.  REVIEW OF SYSTEMS:  Review of Systems  Neurological: Positive for numbness.  All other systems reviewed and are negative.    PAST MEDICAL/SURGICAL HISTORY:  Past Medical History:  Diagnosis Date  . Anxiety   . Bone metastases ([place]) 09/10/2016  . Breast cancer ([place]) 2011   Stave IV breast cancer; radiation and tamoxifen  . Breast cancer, male ([place])    Stave IV breast cancer; radiation and tamoxifen  Overview:  Left breast ca with mets bone  Overview:  METS TO BONE  . GERD (gastroesophageal reflux disease)   . Hypertension   . Macular degeneration   . Multiple myeloma ([place]) 02/18/2017  . Peripheral neuropathy    Past Surgical History:  Procedure Laterality Date  . BACK SURGERY    . CATARACT EXTRACTION W/PHACO Left 02/03/2019   Procedure: CATARACT EXTRACTION PHACO AND INTRAOCULAR LENS PLACEMENT ([place]);  Surgeon: Baruch Goldmann, MD;  Location: [facility];  Service: Ophthalmology;  Laterality: Left;  CDE: 15.89  . HERNIA REPAIR    . PORTACATH PLACEMENT Left 06/10/2018   Procedure: INSERTION PORT-A-CATH;  Surgeon: Virl Cagey, MD;  Location: [facility];  Service: General;  Laterality: Left;  . right arm surgery       SOCIAL HISTORY:  Social History   Socioeconomic History  . Marital status: Married    Spouse name: Not on file  . Number of children: 3  . Years of education: Not on file  . Highest education level: Not on file  Occupational History  . Not on file  Tobacco Use  . Smoking status: Never Smoker  . Smokeless tobacco: Former Network engineer and Sexual Activity  . Alcohol use: Yes    Alcohol/week: 24.0 standard drinks    Types: 24 Cans of beer per week  . Drug use: No  . Sexual activity: Not on file  Other Topics Concern  . Not on file  Social History Narrative  . Not on file   Social Determinants of Health   Financial Resource Strain:   . Difficulty of Paying Living Expenses:   Food Insecurity:   . Worried About Charity fundraiser in the Last Year:   . Arboriculturist in the Last Year:   Transportation Needs:   . Film/video editor (Medical):   Marland Kitchen Lack of Transportation (Non-Medical):   Physical Activity:   . Days of Exercise per Week:   . Minutes of Exercise per Session:   Stress:   .  Feeling of Stress :   Social Connections:   . Frequency of Communication with Friends and Family:   . Frequency of Social Gatherings with Friends and Family:   . Attends Religious Services:   . Active Member of Clubs or Organizations:   . Attends Archivist Meetings:   Marland Kitchen Marital Status:   Intimate Partner Violence:   . Fear of Current or Ex-Partner:   . Emotionally Abused:   Marland Kitchen Physically Abused:   . Sexually Abused:     FAMILY HISTORY:  Family History  Problem Relation Age of Onset  . Stroke Mother   . Cancer Maternal Aunt        cancer NOS; died in her 90s  . Lung cancer Maternal Uncle        smoker    CURRENT MEDICATIONS:  Outpatient Encounter Medications as of 03/18/2020  Medication Sig Note  . acyclovir (ZOVIRAX) 800 MG tablet TAKE 1 TABLET TWICE DAILY     . ALPRAZolam (XANAX) 0.5 MG tablet TAKE 1 TABLET BY MOUTH AT BEDTIME AS NEEDED FOR ANXIETY OR SLEEP   . amLODipine (NORVASC) 5 MG tablet Take 1 tablet (5 mg total) by mouth daily.   Marland Kitchen aspirin EC 81 MG tablet Take 81 mg by mouth daily.   . calcium-vitamin D (OSCAL WITH D) 500-200 MG-UNIT TABS tablet Take 1 tablet by mouth 2 (two) times daily.   Marland Kitchen CARFILZOMIB IV Inject into the vein.   . Cholecalciferol (VITAMIN D) 50 MCG (2000 UT) CAPS Take 4,000 Units by mouth 2 (two) times daily.   Marland Kitchen denosumab (XGEVA) 120 MG/1.7ML SOLN injection Inject 120 mg into the skin once. 01/18/2019: [place]   . dexamethasone (DECADRON) 4 MG tablet TAKE FIVE TABLETS BY MOUTH ONCE A WEEK. TAKE FIVE TABLETS THE DAY OF TREATMENT   . diclofenac Sodium (VOLTAREN) 1 % GEL Use 1 to 2 times per day as directed by doctor   . folic acid (FOLVITE) 1 MG tablet TAKE 1 TABLET BY MOUTH DAILY   . gabapentin (NEURONTIN) 300 MG capsule Take 1 capsule (300 mg total) by mouth 2 (two) times daily.   . hydrALAZINE (APRESOLINE) 50 MG tablet Take 1 tablet (50 mg total) by mouth every 8 (eight) hours. (Patient taking differently: Take 50 mg by mouth 2 (two) times daily. )   . loratadine (CLARITIN) 10 MG tablet Take 1 tablet (10 mg total) by mouth daily.   . metoprolol succinate (TOPROL-XL) 100 MG 24 hr tablet Take 100 mg by mouth daily.    . Multiple Vitamin (MULTIVITAMIN WITH MINERALS) TABS tablet Take 1 tablet by mouth daily.   . pantoprazole (PROTONIX) 40 MG tablet Take 40 mg by mouth every other day.    . tamoxifen (NOLVADEX) 20 MG tablet TAKE 1 TABLET (20 MG TOTAL) BY MOUTH DAILY.   . vitamin B-12 (CYANOCOBALAMIN) 1000 MCG tablet Take 1,000 mcg by mouth daily.   Marland Kitchen augmented betamethasone dipropionate (DIPROLENE-AF) 0.05 % cream Apply topically as needed.    . ondansetron (ZOFRAN) 8 MG tablet TAKE ONE TABLET BY MOUTH EVERY 8 HOURS AS NEEDED (Patient not taking: Reported on 01/09/2020)   . polyethylene glycol (MIRALAX / GLYCOLAX)  packet Take 17 g by mouth daily as needed for mild constipation. (Patient not taking: Reported on 01/09/2020)   . prochlorperazine (COMPAZINE) 10 MG tablet Take 1 tablet (10 mg total) by mouth every 6 (six) hours as needed for nausea or vomiting. (Patient not taking: Reported on 03/18/2020)   .  pseudoephedrine-guaifenesin (MUCINEX D) 60-600 MG 12 hr tablet Take 1 tablet by mouth 2 (two) times daily as needed for congestion.    . [DISCONTINUED] ALPRAZolam (XANAX) 0.5 MG tablet TAKE 1 TABLET BY MOUTH AT BEDTIME AS NEEDED FOR ANXIETY OR SLEEP    No facility-administered encounter medications on file as of 03/18/2020.    ALLERGIES:  Allergies  Allergen Reactions  . Cefepime     Suspected severe thrombocytopenia is a result of cefepime induced antigen platelet destruction     PHYSICAL EXAM:  ECOG Performance status: 1  Vitals:   03/18/20 0923  BP: 127/68  Pulse: 73  Resp: 18  Temp: (!) 97.5 F (36.4 C)  SpO2: 98%   Filed Weights   03/18/20 0923  Weight: 217 lb 3.2 oz (98.5 kg)    Physical Exam Constitutional:      Appearance: Normal appearance.  HENT:     Head: Normocephalic.     Nose: Nose normal.     Mouth/Throat:     Mouth: Mucous membranes are moist.     Pharynx: Oropharynx is clear.  Eyes:     Extraocular Movements: Extraocular movements intact.     Conjunctiva/sclera: Conjunctivae normal.  Cardiovascular:     Rate and Rhythm: Normal rate and regular rhythm.     Pulses: Normal pulses.     Heart sounds: Normal heart sounds.  Pulmonary:     Effort: Pulmonary effort is normal.     Breath sounds: Normal breath sounds.  Abdominal:     General: Bowel sounds are normal.     Palpations: Abdomen is soft.  Musculoskeletal:        General: Normal range of motion.     Cervical back: Normal range of motion.  Skin:    General: Skin is warm and dry.  Neurological:     General: No focal deficit present.     Mental Status: He is alert and oriented to person, place, and time.  Mental status is at baseline.  Psychiatric:        Mood and Affect: Mood normal.        Behavior: Behavior normal.        Thought Content: Thought content normal.        Judgment: Judgment normal.      LABORATORY DATA:  I have reviewed the labs as listed.  CBC    Component Value Date/Time   WBC 7.4 03/18/2020 0838   RBC 2.75 (L) 03/18/2020 0838   HGB 10.0 (L) 03/18/2020 0838   HCT 30.7 (L) 03/18/2020 0838   PLT 163 03/18/2020 0838   MCV 111.6 (H) 03/18/2020 0838   MCH 36.4 (H) 03/18/2020 0838   MCHC 32.6 03/18/2020 0838   RDW 13.6 03/18/2020 0838   LYMPHSABS 0.9 03/18/2020 0838   MONOABS 0.5 03/18/2020 0838   EOSABS 0.2 03/18/2020 0838   BASOSABS 0.0 03/18/2020 0838   CMP Latest Ref Rng & Units 03/18/2020 03/12/2020 03/05/2020  Glucose 70 - 99 mg/dL 102(H) 102(H) 125(H)  BUN 8 - 23 mg/dL 22 24(H) 21  Creatinine 0.61 - 1.24 mg/dL 1.74(H) 1.75(H) 1.81(H)  Sodium 135 - 145 mmol/L 139 138 138  Potassium 3.5 - 5.1 mmol/L 4.4 4.4 4.0  Chloride 98 - 111 mmol/L 104 106 102  CO2 22 - 32 mmol/L _0 Calcium 8.9 - 10.3 mg/dL 9.1 8.8(L) 9.4  Total Protein 6.5 - 8.1 g/dL 5.9(L) 5.8(L) 6.1(L)  Total Bilirubin 0.3 - 1.2 mg/dL 0.6 0.7 0.5  Alkaline Phos  38 - 126 U/L 26(L) 25(L) 25(L)  AST 15 - 41 U/L _0 ALT 0 - 44 U/L _1 Radiology: I have independently reviewed scans.  ASSESSMENT & PLAN:   Multiple myeloma ([place]) 1.  Relapsed multiple myeloma: -Cycle 1 of daratumumab, bortezomib and dexamethasone started on 02/28/2020. -PET scan on 02/18/2020 showed left supraclavicular lymph node and largely improved diffuse bone lesions. -I have reviewed his labs from 03/05/2020.  Kappa light chains increased to 172.5 from 123 previously.  Ratio is more than 115. -Today's CBC was grossly within normal limits.  LFTs are normal.  Will check ferritin and iron panel next week. -he will start cycle 2-day 1 today.  I plan to see him back in 3 weeks for follow-up.  2.  ID  prophylaxis: -She will continue acyclovir twice daily.  3.  Metastatic left breast cancer to the bones: -He will continue tamoxifen daily.  4.  Peripheral neuropathy: -he will continue gabapentin 300 mg twice daily.  5.  Myeloma bone disease: -Denosumab started back on 03/02/2020.   Orders placed this encounter:  Orders Placed This Encounter  Procedures  . CBC with Differential/Platelet  . Comprehensive metabolic panel  . Magnesium  . Iron and TIBC  . Ferritin  . Vitamin B12  . Folate      Derek Jack, MD [place] [phone]

## 2020-03-18 NOTE — Assessment & Plan Note (Signed)
1.  Relapsed multiple myeloma: -Cycle 1 of daratumumab, bortezomib and dexamethasone started on 02/28/2020. -PET scan on 02/18/2020 showed left supraclavicular lymph node and largely improved diffuse bone lesions. -I have reviewed his labs from 03/05/2020.  Kappa light chains increased to 172.5 from 123 previously.  Ratio is more than 115. -Today's CBC was grossly within normal limits.  LFTs are normal.  Will check ferritin and iron panel next week. -he will start cycle 2-day 1 today.  I plan to see him back in 3 weeks for follow-up.  2.  ID prophylaxis: -She will continue acyclovir twice daily.  3.  Metastatic left breast cancer to the bones: -He will continue tamoxifen daily.  4.  Peripheral neuropathy: -he will continue gabapentin 300 mg twice daily.  5.  Myeloma bone disease: -Denosumab started back on 03/02/2020.

## 2020-03-18 NOTE — Patient Instructions (Signed)
[facility] Discharge Instructions  You were seen today by Dr. Katragadda. He went over your recent lab results. He will see you back in 3 weeks for labs, treatment and follow up.   Thank you for choosing [facility] at [facility] to provide your oncology and hematology care.  To afford each patient quality time with our provider, please arrive at least 15 minutes before your scheduled appointment time.   If you have a lab appointment with the Cancer Center please come in thru the  Main Entrance and check in at the main information desk  You need to re-schedule your appointment should you arrive 10 or more minutes late.  We strive to give you quality time with our providers, and arriving late affects you and other patients whose appointments are after yours.  Also, if you no show three or more times for appointments you may be dismissed from the clinic at the providers discretion.     Again, thank you for choosing [facility].  Our hope is that these requests will decrease the amount of time that you wait before being seen by our physicians.       _____________________________________________________________  Should you have questions after your visit to [facility], please contact our office at [phone] between the hours of 8:00 a.m. and 4:30 p.m.  Voicemails left after 4:00 p.m. will not be returned until the following business day.  For prescription refill requests, have your pharmacy contact our office and allow 72 hours.    Cancer Center Support Programs:   > Cancer Support Group  2nd Tuesday of the month 1pm-2pm, Journey Room    

## 2020-03-18 NOTE — Progress Notes (Signed)
Patient presents today for treatment and follow up visit with Dr. Delton Coombes. Labs reviewed. Vital signs within parameters for treatment.  Creatine 1.74. MD aware. Message received from [facility] LPN / Dr. Delton Coombes to proceed with treatment. Patient to received iron studies the next visit with labs per [facility] LPN.   Treatment given today per MD orders. Tolerated i without adverse affects. Vital signs stable. No complaints at this time. Discharged from clinic ambulatory. F/U with [facility] as scheduled.

## 2020-03-18 NOTE — Patient Instructions (Signed)
[facility] Discharge Instructions for Patients Receiving Chemotherapy  Today you received the following chemotherapy agents   To help prevent nausea and vomiting after your treatment, we encourage you to take your nausea medication   If you develop nausea and vomiting that is not controlled by your nausea medication, call the clinic.   BELOW ARE SYMPTOMS THAT SHOULD BE REPORTED IMMEDIATELY:  *FEVER GREATER THAN 100.5 F  *CHILLS WITH OR WITHOUT FEVER  NAUSEA AND VOMITING THAT IS NOT CONTROLLED WITH YOUR NAUSEA MEDICATION  *UNUSUAL SHORTNESS OF BREATH  *UNUSUAL BRUISING OR BLEEDING  TENDERNESS IN MOUTH AND THROAT WITH OR WITHOUT PRESENCE OF ULCERS  *URINARY PROBLEMS  *BOWEL PROBLEMS  UNUSUAL RASH Items with * indicate a potential emergency and should be followed up as soon as possible.  Feel free to call the clinic should you have any questions or concerns. The clinic phone number is [phone].  Please show the CHEMO ALERT CARD at check-in to the Emergency Department and triage nurse.   

## 2020-03-18 NOTE — Progress Notes (Signed)
Patient has been assessed, vital signs and labs have been reviewed by Dr. Katragadda. ANC, Creatinine, LFTs, and Platelets are within treatment parameters per Dr. Katragadda. The patient is good to proceed with treatment at this time.  

## 2020-03-21 ENCOUNTER — Encounter (HOSPITAL_COMMUNITY): Payer: Self-pay

## 2020-03-21 ENCOUNTER — Other Ambulatory Visit: Payer: Self-pay

## 2020-03-21 ENCOUNTER — Inpatient Hospital Stay (HOSPITAL_COMMUNITY): Payer: Medicare Other

## 2020-03-21 ENCOUNTER — Ambulatory Visit (HOSPITAL_COMMUNITY): Payer: Medicare Other

## 2020-03-21 VITALS — BP 123/66 | HR 76 | Temp 97.3°F | Resp 18

## 2020-03-21 DIAGNOSIS — C9000 Multiple myeloma not having achieved remission: Secondary | ICD-10-CM | POA: Diagnosis not present

## 2020-03-21 DIAGNOSIS — G629 Polyneuropathy, unspecified: Secondary | ICD-10-CM | POA: Diagnosis not present

## 2020-03-21 DIAGNOSIS — Z5111 Encounter for antineoplastic chemotherapy: Secondary | ICD-10-CM | POA: Diagnosis not present

## 2020-03-21 DIAGNOSIS — Z5112 Encounter for antineoplastic immunotherapy: Secondary | ICD-10-CM | POA: Diagnosis not present

## 2020-03-21 DIAGNOSIS — C50922 Malignant neoplasm of unspecified site of left male breast: Secondary | ICD-10-CM | POA: Diagnosis not present

## 2020-03-21 DIAGNOSIS — C7951 Secondary malignant neoplasm of bone: Secondary | ICD-10-CM | POA: Diagnosis not present

## 2020-03-21 MED ORDER — DEXAMETHASONE 4 MG PO TABS: 20.0000 mg | ORAL_TABLET | Freq: Once | ORAL | Status: DC

## 2020-03-21 MED ORDER — BORTEZOMIB CHEMO SQ INJECTION 3.5 MG (2.5MG/ML)
1.3000 mg/m2 | Freq: Once | INTRAMUSCULAR | Status: AC
  Administered 2020-03-21: 2.75 mg via SUBCUTANEOUS
  Filled 2020-03-21: qty 1.1

## 2020-03-21 NOTE — Patient Instructions (Signed)
[facility] Discharge Instructions for Patients Receiving Chemotherapy   Beginning January 23rd 2017 lab work for the Cancer Center will be done in the  Main lab at [facility] on 1st floor. If you have a lab appointment with the Cancer Center please come in thru the  Main Entrance and check in at the main information desk   Today you received the following chemotherapy agents Velcade injection. Follow-up as scheduled. Call clinic for any questions or concerns  To help prevent nausea and vomiting after your treatment, we encourage you to take your nausea medication   If you develop nausea and vomiting, or diarrhea that is not controlled by your medication, call the clinic.  The clinic phone number is [phone]. Office hours are Monday-Friday 8:30am-5:00pm.  BELOW ARE SYMPTOMS THAT SHOULD BE REPORTED IMMEDIATELY:  *FEVER GREATER THAN 101.0 F  *CHILLS WITH OR WITHOUT FEVER  NAUSEA AND VOMITING THAT IS NOT CONTROLLED WITH YOUR NAUSEA MEDICATION  *UNUSUAL SHORTNESS OF BREATH  *UNUSUAL BRUISING OR BLEEDING  TENDERNESS IN MOUTH AND THROAT WITH OR WITHOUT PRESENCE OF ULCERS  *URINARY PROBLEMS  *BOWEL PROBLEMS  UNUSUAL RASH Items with * indicate a potential emergency and should be followed up as soon as possible. If you have an emergency after office hours please contact your primary care physician or go to the nearest emergency department.  Please call the clinic during office hours if you have any questions or concerns.   You may also contact the Patient Navigator at [phone] should you have any questions or need assistance in obtaining follow up care.      Resources For Cancer Patients and their Caregivers ? American Cancer Society: Can assist with transportation, wigs, general needs, runs Look Good Feel Better.        1-888-227-6333 ? Cancer Care: Provides financial assistance, online support groups, medication/co-pay assistance.   1-800-813-HOPE (4673) ? [facility] Assists [place] cancer patients and their families through emotional , educational and financial support.  336-427-4357 ? [place] DSS Where to apply for food stamps, Medicaid and utility assistance. 336-342-1394 ? RCATS: Transportation to medical appointments. 336-347-2287 ? Social Security Administration: May apply for disability if have a Stage IV cancer. 336-342-7796 1-800-772-1213 ? [place] Aging, Disability and Transit Services: Assists with nutrition, care and transit needs. [phone]         

## 2020-03-21 NOTE — Progress Notes (Signed)
Johnny Lucas tolerated Velcade injection well without complaints or incident. VSS Pt discharged self ambulatory in satisfactory condition

## 2020-03-22 ENCOUNTER — Ambulatory Visit (HOSPITAL_COMMUNITY): Payer: Medicare Other

## 2020-03-22 DIAGNOSIS — Z23 Encounter for immunization: Secondary | ICD-10-CM | POA: Diagnosis not present

## 2020-03-25 ENCOUNTER — Other Ambulatory Visit (HOSPITAL_COMMUNITY): Payer: Self-pay | Admitting: *Deleted

## 2020-03-25 ENCOUNTER — Other Ambulatory Visit: Payer: Self-pay

## 2020-03-25 ENCOUNTER — Inpatient Hospital Stay (HOSPITAL_BASED_OUTPATIENT_CLINIC_OR_DEPARTMENT_OTHER): Payer: Medicare Other | Admitting: Hematology

## 2020-03-25 ENCOUNTER — Inpatient Hospital Stay (HOSPITAL_COMMUNITY): Payer: Medicare Other

## 2020-03-25 ENCOUNTER — Encounter (HOSPITAL_COMMUNITY): Payer: Self-pay | Admitting: Hematology

## 2020-03-25 VITALS — BP 100/61 | HR 73 | Temp 97.3°F | Resp 18 | Wt 218.0 lb

## 2020-03-25 DIAGNOSIS — C50922 Malignant neoplasm of unspecified site of left male breast: Secondary | ICD-10-CM | POA: Diagnosis not present

## 2020-03-25 DIAGNOSIS — C9000 Multiple myeloma not having achieved remission: Secondary | ICD-10-CM

## 2020-03-25 DIAGNOSIS — C7951 Secondary malignant neoplasm of bone: Secondary | ICD-10-CM | POA: Diagnosis not present

## 2020-03-25 DIAGNOSIS — Z5111 Encounter for antineoplastic chemotherapy: Secondary | ICD-10-CM | POA: Diagnosis not present

## 2020-03-25 DIAGNOSIS — G629 Polyneuropathy, unspecified: Secondary | ICD-10-CM | POA: Diagnosis not present

## 2020-03-25 DIAGNOSIS — Z5112 Encounter for antineoplastic immunotherapy: Secondary | ICD-10-CM | POA: Diagnosis not present

## 2020-03-25 LAB — COMPREHENSIVE METABOLIC PANEL
ALT: 15 U/L (ref 0–44)
AST: 13 U/L — ABNORMAL LOW (ref 15–41)
Albumin: 3.4 g/dL — ABNORMAL LOW (ref 3.5–5.0)
Alkaline Phosphatase: 23 U/L — ABNORMAL LOW (ref 38–126)
Anion gap: 6 (ref 5–15)
BUN: 22 mg/dL (ref 8–23)
CO2: 27 mmol/L (ref 22–32)
Calcium: 9 mg/dL (ref 8.9–10.3)
Chloride: 105 mmol/L (ref 98–111)
Creatinine, Ser: 1.74 mg/dL — ABNORMAL HIGH (ref 0.61–1.24)
GFR calc Af Amer: 45 mL/min — ABNORMAL LOW (ref >60)
GFR calc non Af Amer: 39 mL/min — ABNORMAL LOW (ref >60)
Glucose, Bld: 99 mg/dL (ref 70–99)
Potassium: 4.5 mmol/L (ref 3.5–5.1)
Sodium: 138 mmol/L (ref 135–145)
Total Bilirubin: 0.4 mg/dL (ref 0.3–1.2)
Total Protein: 5.8 g/dL — ABNORMAL LOW (ref 6.5–8.1)

## 2020-03-25 LAB — IRON AND TIBC
Iron: 34 ug/dL — ABNORMAL LOW (ref 45–182)
Saturation Ratios: 10 % — ABNORMAL LOW (ref 17.9–39.5)
TIBC: 325 ug/dL (ref 250–450)
UIBC: 291 ug/dL

## 2020-03-25 LAB — CBC WITH DIFFERENTIAL/PLATELET
Abs Immature Granulocytes: 0.01 10*3/uL (ref 0.00–0.07)
Basophils Absolute: 0 10*3/uL (ref 0.0–0.1)
Basophils Relative: 0 %
Eosinophils Absolute: 0.3 10*3/uL (ref 0.0–0.5)
Eosinophils Relative: 6 %
HCT: 30.4 % — ABNORMAL LOW (ref 39.0–52.0)
Hemoglobin: 9.8 g/dL — ABNORMAL LOW (ref 13.0–17.0)
Immature Granulocytes: 0 %
Lymphocytes Relative: 10 %
Lymphs Abs: 0.5 10*3/uL — ABNORMAL LOW (ref 0.7–4.0)
MCH: 35.6 pg — ABNORMAL HIGH (ref 26.0–34.0)
MCHC: 32.2 g/dL (ref 30.0–36.0)
MCV: 110.5 fL — ABNORMAL HIGH (ref 80.0–100.0)
Monocytes Absolute: 0.4 10*3/uL (ref 0.1–1.0)
Monocytes Relative: 8 %
Neutro Abs: 3.4 10*3/uL (ref 1.7–7.7)
Neutrophils Relative %: 76 %
Platelets: 103 10*3/uL — ABNORMAL LOW (ref 150–400)
RBC: 2.75 MIL/uL — ABNORMAL LOW (ref 4.22–5.81)
RDW: 13.5 % (ref 11.5–15.5)
WBC: 4.5 10*3/uL (ref 4.0–10.5)
nRBC: 0.4 % — ABNORMAL HIGH (ref 0.0–0.2)

## 2020-03-25 LAB — VITAMIN B12: Vitamin B-12: 942 pg/mL — ABNORMAL HIGH (ref 180–914)

## 2020-03-25 LAB — MAGNESIUM: Magnesium: 1.9 mg/dL (ref 1.7–2.4)

## 2020-03-25 LAB — FOLATE: Folate: 64.1 ng/mL (ref >5.9)

## 2020-03-25 LAB — FERRITIN: Ferritin: 640 ng/mL — ABNORMAL HIGH (ref 24–336)

## 2020-03-25 MED ORDER — DEXAMETHASONE 4 MG PO TABS: ORAL_TABLET | ORAL | 5 refills | Status: DC

## 2020-03-25 MED ORDER — DEXAMETHASONE 4 MG PO TABS
20.0000 mg | ORAL_TABLET | Freq: Once | ORAL | Status: AC
  Administered 2020-03-25: 11:00:00 20 mg via ORAL

## 2020-03-25 MED ORDER — ACETAMINOPHEN 325 MG PO TABS
ORAL_TABLET | ORAL | Status: AC
  Filled 2020-03-25: qty 2

## 2020-03-25 MED ORDER — DEXAMETHASONE 4 MG PO TABS
ORAL_TABLET | ORAL | Status: AC
  Filled 2020-03-25: qty 5

## 2020-03-25 MED ORDER — DARATUMUMAB-HYALURONIDASE-FIHJ 1800-30000 MG-UT/15ML ~~LOC~~ SOLN
1800.0000 mg | Freq: Once | SUBCUTANEOUS | Status: AC
  Administered 2020-03-25: 1800 mg via SUBCUTANEOUS
  Filled 2020-03-25: qty 15

## 2020-03-25 MED ORDER — DIPHENHYDRAMINE HCL 25 MG PO CAPS
50.0000 mg | ORAL_CAPSULE | Freq: Once | ORAL | Status: AC
  Administered 2020-03-25: 11:00:00 50 mg via ORAL

## 2020-03-25 MED ORDER — DIPHENHYDRAMINE HCL 25 MG PO CAPS
ORAL_CAPSULE | ORAL | Status: AC
  Filled 2020-03-25: qty 2

## 2020-03-25 MED ORDER — BORTEZOMIB CHEMO SQ INJECTION 3.5 MG (2.5MG/ML)
1.3000 mg/m2 | Freq: Once | INTRAMUSCULAR | Status: AC
  Administered 2020-03-25: 2.75 mg via SUBCUTANEOUS
  Filled 2020-03-25: qty 1.1

## 2020-03-25 MED ORDER — ACETAMINOPHEN 325 MG PO TABS
650.0000 mg | ORAL_TABLET | Freq: Once | ORAL | Status: AC
  Administered 2020-03-25: 650 mg via ORAL

## 2020-03-25 NOTE — Patient Instructions (Signed)
[facility] Discharge Instructions for Patients Receiving Chemotherapy  Today you received the following chemotherapy agents   To help prevent nausea and vomiting after your treatment, we encourage you to take your nausea medication   If you develop nausea and vomiting that is not controlled by your nausea medication, call the clinic.   BELOW ARE SYMPTOMS THAT SHOULD BE REPORTED IMMEDIATELY:  *FEVER GREATER THAN 100.5 F  *CHILLS WITH OR WITHOUT FEVER  NAUSEA AND VOMITING THAT IS NOT CONTROLLED WITH YOUR NAUSEA MEDICATION  *UNUSUAL SHORTNESS OF BREATH  *UNUSUAL BRUISING OR BLEEDING  TENDERNESS IN MOUTH AND THROAT WITH OR WITHOUT PRESENCE OF ULCERS  *URINARY PROBLEMS  *BOWEL PROBLEMS  UNUSUAL RASH Items with * indicate a potential emergency and should be followed up as soon as possible.  Feel free to call the clinic should you have any questions or concerns. The clinic phone number is [phone].  Please show the CHEMO ALERT CARD at check-in to the Emergency Department and triage nurse.   

## 2020-03-25 NOTE — Progress Notes (Signed)
[place], [place] [id number]   CLINIC:  Medical Oncology/Hematology  PCP:  Johnny Percy, MD [place] [address] [phone]   REASON FOR VISIT:  Follow-up for Multiple Myeloma  CURRENT THERAPY: Daratumumab, Velcade and dexamethasone.  BRIEF ONCOLOGIC HISTORY:  Oncology History  Breast Lucas, male ([place])  12/31/2009 Initial Biopsy   Biopsy of L breast    12/31/2009 Pathology Results   Invasive ductal carcinoma, ER/PR+, HER 2 negative   12/31/2009 Imaging   Ultrasound showing a 2.43 x 1.85 x 3 cm hypoechoic spiculated mass in the 12 o clock L breast retroareolar region   01/01/2010 -  Anti-estrogen oral therapy   Tamoxifen 20 mg daily   01/06/2010 Imaging   Bone scan abnormal uptake in the diaphysis of the R humerus, abnormal in the R third, fifth and sixth ribs, lesion also noted in the sternum.   02/03/2010 Surgery   Rod placement and fixation of R humerus by Dr. Amedeo Lucas   02/05/2010 - 02/18/2010 Radiation Therapy   30Gy in 10 fractions of 3 Gy per fraction to R pathologic fracture   03/11/2010 -  Chemotherapy   Denosumab monthly, now every 3 months. Started at [facility]    06/09/2016 Imaging   Three hypermetabolic osseous lesions in the sternum, left ilium and right ilium, as discussed above, likely represent osseous metastases. At this time, these are not recognizable on the CT images. 2. No extra skeletal metastatic disease identified in the neck, chest, abdomen or pelvis.   10/13/2016 Progression   PET shows various new and enlarging osseous metastatic lesions with no definite extra osseous metastatic disease currently identified.    12/31/2016 Progression   1. Multifocal hypermetabolic osseous metastases throughout the axial and proximal appendicular skeleton, which are increased in size, number and metabolism since 10/13/2016 PET-CT. 2. 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 3. No additional sites of hypermetabolic metastatic disease. 4. Chronic right mastoid sinusitis. 5. Aortic atherosclerosis.  One vessel coronary atherosclerosis.   Multiple myeloma ([place])  02/12/2017 Bone Marrow Biopsy   The marrow was variably cellular with large peritrabecular aggregates of kappa restricted plasma cells (66% by aspirate, 30% by Cd138). Cytogenetics +11.    03/01/2017 - 06/29/2017 Chemotherapy   RVD    05/26/2017 Bone Marrow Biopsy   Performed at [facility]:  Plasma cell myeloma in a 30% cellularmarrow with decreased trilineage hematopoiesis and 42% kappalight chain restricted plasma cells on the aspirate smears andlarge aggregates on the core biopsy.    07/12/2017 - 09/01/2017 Chemotherapy   3 cycles of carfizolmib/cyclophosphamide/dexamethasone     10/08/2017 Bone Marrow Transplant   Autotransplant at [facility]   02/28/2020 -  Chemotherapy   The patient had daratumumab-hyaluronidase-fihj (DARZALEX FASPRO) 1800-30000 MG-UT/15ML chemo SQ injection 1,800 mg, 1,800 mg, Subcutaneous,  Once, 2 of 12 cycles Administration: 1,800 mg (02/28/2020), 1,800 mg (03/05/2020), 1,800 mg (03/12/2020), 1,800 mg (03/18/2020), 1,800 mg (03/25/2020) bortezomib SQ (VELCADE) chemo injection 2.75 mg, 1.3 mg/m2 = 2.75 mg, Subcutaneous,  Once, 2 of 8 cycles Administration: 2.75 mg (02/28/2020), 2.75 mg (03/05/2020), 2.75 mg (03/08/2020), 2.75 mg (03/18/2020), 2.75 mg (03/21/2020), 2.75 mg (03/25/2020)  for chemotherapy treatment.    Multiple myeloma not having achieved remission (Johnny Lucas)  06/29/2017 Initial Diagnosis   Multiple myeloma not having achieved remission (Johnny Lucas)   02/28/2020 -  Chemotherapy  The patient had daratumumab-hyaluronidase-fihj (DARZALEX FASPRO) 1800-30000 MG-UT/15ML chemo SQ injection 1,800 mg, 1,800 mg, Subcutaneous,  Once, 2 of 12 cycles Administration: 1,800 mg (02/28/2020), 1,800 mg  (03/05/2020), 1,800 mg (03/12/2020), 1,800 mg (03/18/2020), 1,800 mg (03/25/2020) bortezomib SQ (VELCADE) chemo injection 2.75 mg, 1.3 mg/m2 = 2.75 mg, Subcutaneous,  Once, 2 of 8 cycles Administration: 2.75 mg (02/28/2020), 2.75 mg (03/05/2020), 2.75 mg (03/08/2020), 2.75 mg (03/18/2020), 2.75 mg (03/21/2020), 2.75 mg (03/25/2020)  for chemotherapy treatment.       Lucas STAGING: Lucas Staging Multiple myeloma (Johnny Lucas) Staging form: Plasma Cell Myeloma and Plasma Cell Disorders, AJCC 8th Edition - Clinical stage from 05/03/2017: Beta-2-microglobulin (mg/L): 3.5, Albumin (g/dL): 3.4, ISS: Stage II, High-risk cytogenetics: Absent, LDH: Not assessed - Signed by Johnny Cancer, PA-C on 05/03/2017    INTERVAL HISTORY:  [name] 74 y.o. male seen for follow-up and toxicity assessment prior to next cycle of treatment for his multiple myeloma.  He felt lightheaded the day after he received daratumumab injection.  Denies any worsening of numbness in the hands and feet.  Appetite is 100%.  Energy levels are 75%.  Denies any headaches.  No fevers, night sweats or weight loss.  REVIEW OF SYSTEMS:  Review of Systems  Neurological: Positive for numbness.  All other systems reviewed and are negative.    PAST MEDICAL/SURGICAL HISTORY:  Past Medical History:  Diagnosis Date  . Anxiety   . Bone metastases (Johnny Lucas) 09/10/2016  . Breast Lucas (Johnny Lucas) 2011   Stave IV breast Lucas; radiation and tamoxifen  . Breast Lucas, male (Johnny Lucas)    Stave IV breast Lucas; radiation and tamoxifen  Overview:  Left breast ca with mets bone  Overview:  METS TO BONE  . GERD (gastroesophageal reflux disease)   . Hypertension   . Macular degeneration   . Multiple myeloma (Johnny Lucas) 02/18/2017  . Peripheral neuropathy    Past Surgical History:  Procedure Laterality Date  . BACK SURGERY    . CATARACT EXTRACTION W/PHACO Left 02/03/2019   Procedure: CATARACT EXTRACTION PHACO AND INTRAOCULAR LENS PLACEMENT (John[place]);  Surgeon: Johnny Goldmann,  MD;  Location: [facility];  Service: Ophthalmology;  Laterality: Left;  CDE: 15.89  . HERNIA REPAIR    . PORTACATH PLACEMENT Left 06/10/2018   Procedure: INSERTION PORT-A-CATH;  Surgeon: Johnny Cagey, MD;  Location: [facility];  Service: General;  Laterality: Left;  . right arm surgery       SOCIAL HISTORY:  Social History   Socioeconomic History  . Marital status: Married    Spouse name: Not on file  . Number of children: 3  . Years of education: Not on file  . Highest education level: Not on file  Occupational History  . Not on file  Tobacco Use  . Smoking status: Never Smoker  . Smokeless tobacco: Former Network engineer and Sexual Activity  . Alcohol use: Yes    Alcohol/week: 24.0 standard drinks    Types: 24 Cans of beer per week  . Drug use: No  . Sexual activity: Not on file  Other Topics Concern  . Not on file  Social History Narrative  . Not on file   Social Determinants of Health   Financial Resource Strain:   . Difficulty of Paying Living Expenses:   Food Insecurity:   . Worried About Charity fundraiser in the Last Year:   . Arboriculturist in the Last Year:   Transportation Needs:   . Film/video editor (Medical):   Marland Kitchen  Lack of Transportation (Non-Medical):   Physical Activity:   . Days of Exercise per Week:   . Minutes of Exercise per Session:   Stress:   . Feeling of Stress :   Social Connections:   . Frequency of Communication with Friends and Family:   . Frequency of Social Gatherings with Friends and Family:   . Attends Religious Services:   . Active Member of Clubs or Organizations:   . Attends Archivist Meetings:   Marland Kitchen Marital Status:   Intimate Partner Violence:   . Fear of Current or Ex-Partner:   . Emotionally Abused:   Marland Kitchen Physically Abused:   . Sexually Abused:     FAMILY HISTORY:  Family History  Problem Relation Age of Onset  . Stroke Mother   . Lucas Maternal Aunt        Lucas NOS; died in her 19s  . Lung Lucas  Maternal Uncle        smoker    CURRENT MEDICATIONS:  Outpatient Encounter Medications as of 03/25/2020  Medication Sig Note  . acyclovir (ZOVIRAX) 800 MG tablet TAKE 1 TABLET TWICE DAILY   . amLODipine (NORVASC) 5 MG tablet Take 1 tablet (5 mg total) by mouth daily.   Marland Kitchen aspirin EC 81 MG tablet Take 81 mg by mouth daily.   . calcium-vitamin D (OSCAL WITH D) 500-200 MG-UNIT TABS tablet Take 1 tablet by mouth 2 (two) times daily.   Marland Kitchen CARFILZOMIB IV Inject into the vein.   . Cholecalciferol (VITAMIN D) 50 MCG (2000 UT) CAPS Take 4,000 Units by mouth 2 (two) times daily.   Marland Kitchen denosumab (XGEVA) 120 MG/1.7ML SOLN injection Inject 120 mg into the skin once. 01/18/2019: [place]   . dexamethasone (DECADRON) 4 MG tablet Take 5 pills before each treatment   . folic acid (FOLVITE) 1 MG tablet TAKE 1 TABLET BY MOUTH DAILY   . gabapentin (NEURONTIN) 300 MG capsule Take 1 capsule (300 mg total) by mouth 2 (two) times daily.   . hydrALAZINE (APRESOLINE) 50 MG tablet Take 1 tablet (50 mg total) by mouth every 8 (eight) hours. (Patient taking differently: Take 50 mg by mouth 2 (two) times daily. )   . loratadine (CLARITIN) 10 MG tablet Take 1 tablet (10 mg total) by mouth daily.   . metoprolol succinate (TOPROL-XL) 100 MG 24 hr tablet Take 100 mg by mouth daily.    . Multiple Vitamin (MULTIVITAMIN WITH MINERALS) TABS tablet Take 1 tablet by mouth daily.   . pantoprazole (PROTONIX) 40 MG tablet Take 40 mg by mouth every other day.    . tamoxifen (NOLVADEX) 20 MG tablet TAKE 1 TABLET (20 MG TOTAL) BY MOUTH DAILY.   . vitamin B-12 (CYANOCOBALAMIN) 1000 MCG tablet Take 1,000 mcg by mouth daily.   . [DISCONTINUED] dexamethasone (DECADRON) 4 MG tablet TAKE FIVE TABLETS BY MOUTH ONCE A WEEK. TAKE FIVE TABLETS THE DAY OF TREATMENT   . ALPRAZolam (XANAX) 0.5 MG tablet TAKE 1 TABLET BY MOUTH AT BEDTIME AS NEEDED FOR ANXIETY OR SLEEP (Patient not taking: Reported on 03/25/2020)   . augmented betamethasone  dipropionate (DIPROLENE-AF) 0.05 % cream Apply topically as needed.    . diclofenac Sodium (VOLTAREN) 1 % GEL Use 1 to 2 times per day as directed by doctor   . ondansetron (ZOFRAN) 8 MG tablet TAKE ONE TABLET BY MOUTH EVERY 8 HOURS AS NEEDED (Patient not taking: Reported on 01/09/2020)   . polyethylene glycol (MIRALAX / GLYCOLAX) packet Take  17 g by mouth daily as needed for mild constipation. (Patient not taking: Reported on 01/09/2020)   . prochlorperazine (COMPAZINE) 10 MG tablet Take 1 tablet (10 mg total) by mouth every 6 (six) hours as needed for nausea or vomiting. (Patient not taking: Reported on 03/18/2020)   . pseudoephedrine-guaifenesin (MUCINEX D) 60-600 MG 12 hr tablet Take 1 tablet by mouth 2 (two) times daily as needed for congestion.    . [DISCONTINUED] ALPRAZolam (XANAX) 0.5 MG tablet TAKE 1 TABLET BY MOUTH AT BEDTIME AS NEEDED FOR ANXIETY OR SLEEP    No facility-administered encounter medications on file as of 03/25/2020.    ALLERGIES:  Allergies  Allergen Reactions  . Cefepime     Suspected severe thrombocytopenia is a result of cefepime induced antigen platelet destruction     PHYSICAL EXAM:  ECOG Performance status: 1  Vitals:   03/25/20 1014  BP: 100/61  Pulse: 73  Resp: 18  Temp: (!) 97.3 F (36.3 C)  SpO2: 97%   Filed Weights   03/25/20 1014  Weight: 218 lb (98.9 kg)    Physical Exam Constitutional:      Appearance: Normal appearance.  HENT:     Head: Normocephalic.  Eyes:     Extraocular Movements: Extraocular movements intact.     Conjunctiva/sclera: Conjunctivae normal.  Cardiovascular:     Rate and Rhythm: Normal rate and regular rhythm.     Pulses: Normal pulses.     Heart sounds: Normal heart sounds.  Pulmonary:     Effort: Pulmonary effort is normal.     Breath sounds: Normal breath sounds.  Musculoskeletal:        General: Normal range of motion.     Cervical back: Normal range of motion.  Skin:    General: Skin is warm and dry.   Neurological:     General: No focal deficit present.     Mental Status: He is alert and oriented to person, place, and time. Mental status is at baseline.  Psychiatric:        Mood and Affect: Mood normal.        Behavior: Behavior normal.        Thought Content: Thought content normal.        Judgment: Judgment normal.      LABORATORY DATA:  I have reviewed the labs as listed.  CBC    Component Value Date/Time   WBC 4.5 03/25/2020 0927   RBC 2.75 (L) 03/25/2020 0927   HGB 9.8 (L) 03/25/2020 0927   HCT 30.4 (L) 03/25/2020 0927   PLT 103 (L) 03/25/2020 0927   MCV 110.5 (H) 03/25/2020 0927   MCH 35.6 (H) 03/25/2020 0927   MCHC 32.2 03/25/2020 0927   RDW 13.5 03/25/2020 0927   LYMPHSABS 0.5 (L) 03/25/2020 0927   MONOABS 0.4 03/25/2020 0927   EOSABS 0.3 03/25/2020 0927   BASOSABS 0.0 03/25/2020 0927   CMP Latest Ref Rng & Units 03/25/2020 03/18/2020 03/12/2020  Glucose 70 - 99 mg/dL 99 102(H) 102(H)  BUN 8 - 23 mg/dL 22 22 24(H)  Creatinine 0.61 - 1.24 mg/dL 1.74(H) 1.74(H) 1.75(H)  Sodium 135 - 145 mmol/L 138 139 138  Potassium 3.5 - 5.1 mmol/L 4.5 4.4 4.4  Chloride 98 - 111 mmol/L 105 104 106  CO2 22 - 32 mmol/L _0 Calcium 8.9 - 10.3 mg/dL 9.0 9.1 8.8(L)  Total Protein 6.5 - 8.1 g/dL 5.8(L) 5.9(L) 5.8(L)  Total Bilirubin 0.3 - 1.2 mg/dL 0.4 0.6 0.7  Alkaline Phos 38 - 126 U/L 23(L) 26(L) 25(L)  AST 15 - 41 U/L 13(L) 15 15  ALT 0 - 44 U/L _0 Radiology: I have independently reviewed the scans.  ASSESSMENT & PLAN:   Multiple myeloma ([place]) 1.  Relapsed multiple myeloma: -Cycle 1 of daratumumab, bortezomib and dexamethasone started on 02/28/2020. -PET scan on 02/18/2020 showed left supraclavicular lymph node and largely improved diffuse bone lesions.   -Cycle 2 on 03/18/2020.  Reported dizziness the next day after his last Darzalex injection. -We reviewed his labs which shows CBC was adequate.  Platelet count is low at 103.  LFTs are normal.  Creatinine is  stable at 1.74.  Myeloma panel from 03/05/2020 shows M spike of 0.4 g. -He will proceed with his treatment today.  I plan to repeat myeloma panel in 1 week and see him back in 2 weeks for follow-up.  2.  Myeloma bone disease: -Denosumab started back on 03/02/2020.  He will receive next dose next Tuesday.  3.  ID prophylaxis: -He will continue acyclovir twice daily.  4.  Metastatic left breast Lucas to bones: -he will continue tamoxifen daily.  5.  Peripheral neuropathy: -He will continue gabapentin 300 mg twice daily.   Orders placed this encounter:  Orders Placed This Encounter  Procedures  . Lactate dehydrogenase  . Protein electrophoresis, serum  . Kappa/lambda light chains      Derek Jack, [place] [phone]

## 2020-03-25 NOTE — Progress Notes (Signed)
Patient has been assessed, vital signs and labs have been reviewed by Dr. Katragadda. ANC, Creatinine, LFTs, and Platelets are within treatment parameters per Dr. Katragadda. The patient is good to proceed with treatment at this time.  

## 2020-03-25 NOTE — Progress Notes (Signed)
Patient presents today for treatment and follow up visit with Dr. Delton Coombes. Vital signs within parameters for treatment. Creatinine 1.74 today. Patient's normal.   Message received from [facility] LPN/ Dr. Delton Coombes. Proceed with treatment. Labs reviewed.   Treatment given today per MD orders. Tolerated  without adverse affects. Vital signs stable. No complaints at this time. Discharged from clinic ambulatory. F/U with [facility] as scheduled.

## 2020-03-25 NOTE — Patient Instructions (Addendum)
[place] at [facility] Discharge Instructions  You were seen today by Dr. Delton Coombes. He went over your recent lab results. We will do labs next week and he will see you back in 2 weeks for follow up.   Thank you for choosing [place] at [facility] to provide your oncology and hematology care.  To afford each patient quality time with our provider, please arrive at least 15 minutes before your scheduled appointment time.   If you have a lab appointment with the [facility] please come in thru the  Main Entrance and check in at the main information desk  You need to re-schedule your appointment should you arrive 10 or more minutes late.  We strive to give you quality time with our providers, and arriving late affects you and other patients whose appointments are after yours.  Also, if you no show three or more times for appointments you may be dismissed from the clinic at the providers discretion.     Again, thank you for choosing [facility].  Our hope is that these requests will decrease the amount of time that you wait before being seen by our physicians.       _____________________________________________________________  Should you have questions after your visit to [facility], please contact our office at [phone] between the hours of 8:00 a.m. and 4:30 p.m.  Voicemails left after 4:00 p.m. will not be returned until the following business day.  For prescription refill requests, have your pharmacy contact our office and allow 72 hours.    Cancer Center Support Programs:   > Cancer Support Group  2nd Tuesday of the month 1pm-2pm, Journey Room

## 2020-03-25 NOTE — Assessment & Plan Note (Signed)
1.  Relapsed multiple myeloma: -Cycle 1 of daratumumab, bortezomib and dexamethasone started on 02/28/2020. -PET scan on 02/18/2020 showed left supraclavicular lymph node and largely improved diffuse bone lesions.   -Cycle 2 on 03/18/2020.  Reported dizziness the next day after his last Darzalex injection. -We reviewed his labs which shows CBC was adequate.  Platelet count is low at 103.  LFTs are normal.  Creatinine is stable at 1.74.  Myeloma panel from 03/05/2020 shows M spike of 0.4 g. -He will proceed with his treatment today.  I plan to repeat myeloma panel in 1 week and see him back in 2 weeks for follow-up.  2.  Myeloma bone disease: -Denosumab started back on 03/02/2020.  He will receive next dose next Tuesday.  3.  ID prophylaxis: -He will continue acyclovir twice daily.  4.  Metastatic left breast cancer to bones: -he will continue tamoxifen daily.  5.  Peripheral neuropathy: -He will continue gabapentin 300 mg twice daily.

## 2020-03-26 ENCOUNTER — Inpatient Hospital Stay (HOSPITAL_COMMUNITY): Payer: Medicare Other | Attending: Hematology

## 2020-03-26 VITALS — BP 125/77 | HR 67 | Temp 97.1°F | Resp 16

## 2020-03-26 DIAGNOSIS — Z79899 Other long term (current) drug therapy: Secondary | ICD-10-CM | POA: Diagnosis not present

## 2020-03-26 DIAGNOSIS — C7951 Secondary malignant neoplasm of bone: Secondary | ICD-10-CM

## 2020-03-26 DIAGNOSIS — C9000 Multiple myeloma not having achieved remission: Secondary | ICD-10-CM

## 2020-03-26 DIAGNOSIS — C50922 Malignant neoplasm of unspecified site of left male breast: Secondary | ICD-10-CM | POA: Diagnosis not present

## 2020-03-26 MED ORDER — DENOSUMAB 120 MG/1.7ML ~~LOC~~ SOLN
120.0000 mg | Freq: Once | SUBCUTANEOUS | Status: AC
  Administered 2020-03-26: 120 mg via SUBCUTANEOUS

## 2020-03-26 MED ORDER — DENOSUMAB 120 MG/1.7ML ~~LOC~~ SOLN
SUBCUTANEOUS | Status: AC
  Filled 2020-03-26: qty 1.7

## 2020-03-26 NOTE — Progress Notes (Signed)

## 2020-03-26 NOTE — Progress Notes (Signed)
Johnny Lucas presents today for injection per MD orders. Xgeva 120 mg administered SQ in left Upper Arm. Administration without incident. Patient tolerated well.  Vitals stable and discharged home from clinic ambulatory. Follow up as scheduled.

## 2020-03-26 NOTE — Patient Instructions (Signed)
[facility] at [facility]  Discharge Instructions:   _______________________________________________________________  Thank you for choosing [facility] at [facility] to provide your oncology and hematology care.  To afford each patient quality time with our providers, please arrive at least 15 minutes before your scheduled appointment.  You need to re-schedule your appointment if you arrive 10 or more minutes late.  We strive to give you quality time with our providers, and arriving late affects you and other patients whose appointments are after yours.  Also, if you no show three or more times for appointments you may be dismissed from the clinic.  Again, thank you for choosing [facility] at [facility]. Our hope is that these requests will allow you access to exceptional care and in a timely manner. _______________________________________________________________  If you have questions after your visit, please contact our office at [phone] between the hours of 8:30 a.m. and 5:00 p.m. Voicemails left after 4:30 p.m. will not be returned until the following business day. _______________________________________________________________  For prescription refill requests, have your pharmacy contact our office. _______________________________________________________________  Recommendations made by the consultant and any test results will be sent to your referring physician. _______________________________________________________________ 

## 2020-03-28 ENCOUNTER — Other Ambulatory Visit: Payer: Self-pay

## 2020-03-28 ENCOUNTER — Encounter (HOSPITAL_COMMUNITY): Payer: Self-pay

## 2020-03-28 ENCOUNTER — Inpatient Hospital Stay (HOSPITAL_COMMUNITY): Payer: Medicare Other | Attending: Hematology

## 2020-03-28 VITALS — BP 115/64 | HR 75 | Temp 97.1°F | Resp 18 | Wt 220.4 lb

## 2020-03-28 DIAGNOSIS — Z79899 Other long term (current) drug therapy: Secondary | ICD-10-CM | POA: Diagnosis not present

## 2020-03-28 DIAGNOSIS — G629 Polyneuropathy, unspecified: Secondary | ICD-10-CM | POA: Insufficient documentation

## 2020-03-28 DIAGNOSIS — Z7982 Long term (current) use of aspirin: Secondary | ICD-10-CM | POA: Insufficient documentation

## 2020-03-28 DIAGNOSIS — Z9221 Personal history of antineoplastic chemotherapy: Secondary | ICD-10-CM | POA: Insufficient documentation

## 2020-03-28 DIAGNOSIS — Z170 Estrogen receptor positive status [ER+]: Secondary | ICD-10-CM | POA: Diagnosis not present

## 2020-03-28 DIAGNOSIS — Z87891 Personal history of nicotine dependence: Secondary | ICD-10-CM | POA: Diagnosis not present

## 2020-03-28 DIAGNOSIS — Z5112 Encounter for antineoplastic immunotherapy: Secondary | ICD-10-CM | POA: Insufficient documentation

## 2020-03-28 DIAGNOSIS — Z5111 Encounter for antineoplastic chemotherapy: Secondary | ICD-10-CM | POA: Insufficient documentation

## 2020-03-28 DIAGNOSIS — C50922 Malignant neoplasm of unspecified site of left male breast: Secondary | ICD-10-CM | POA: Insufficient documentation

## 2020-03-28 DIAGNOSIS — Z801 Family history of malignant neoplasm of trachea, bronchus and lung: Secondary | ICD-10-CM | POA: Diagnosis not present

## 2020-03-28 DIAGNOSIS — I10 Essential (primary) hypertension: Secondary | ICD-10-CM | POA: Diagnosis not present

## 2020-03-28 DIAGNOSIS — K219 Gastro-esophageal reflux disease without esophagitis: Secondary | ICD-10-CM | POA: Insufficient documentation

## 2020-03-28 DIAGNOSIS — F419 Anxiety disorder, unspecified: Secondary | ICD-10-CM | POA: Insufficient documentation

## 2020-03-28 DIAGNOSIS — C7951 Secondary malignant neoplasm of bone: Secondary | ICD-10-CM | POA: Diagnosis not present

## 2020-03-28 DIAGNOSIS — C9000 Multiple myeloma not having achieved remission: Secondary | ICD-10-CM | POA: Diagnosis not present

## 2020-03-28 MED ORDER — BORTEZOMIB CHEMO SQ INJECTION 3.5 MG (2.5MG/ML)
1.3000 mg/m2 | Freq: Once | INTRAMUSCULAR | Status: AC
  Administered 2020-03-28: 11:00:00 2.75 mg via SUBCUTANEOUS
  Filled 2020-03-28: qty 1.1

## 2020-03-28 NOTE — Progress Notes (Signed)
Patient tolerated Velcade injection with no complaints voiced.  Lab work reviewed.  See MAR for details.  Injection site clean and dry with no bruising or swelling noted.  Band aid applied.  VSS.  Patient left ambulatory with no s/s of distress noted.

## 2020-03-28 NOTE — Progress Notes (Signed)
Patient presents today for Velcade injection. MAR reviewed and updated. Vital signs within parameters for treatment. Patient takes Dexamethasone at home prior to Velcade injection. Patient has no complaints of any significant changes since his last treatment. Patient states he wasn't able to sleep well after his last treatment. Patient teaching performed. Patient states his taste has diminished.

## 2020-04-01 ENCOUNTER — Encounter (HOSPITAL_COMMUNITY): Payer: Self-pay

## 2020-04-01 ENCOUNTER — Other Ambulatory Visit: Payer: Self-pay

## 2020-04-01 ENCOUNTER — Inpatient Hospital Stay (HOSPITAL_COMMUNITY): Payer: Medicare Other

## 2020-04-01 ENCOUNTER — Ambulatory Visit (HOSPITAL_COMMUNITY): Payer: Medicare Other | Admitting: Hematology

## 2020-04-01 VITALS — BP 121/71 | HR 65 | Temp 98.0°F | Resp 18 | Wt 220.2 lb

## 2020-04-01 DIAGNOSIS — Z170 Estrogen receptor positive status [ER+]: Secondary | ICD-10-CM | POA: Diagnosis not present

## 2020-04-01 DIAGNOSIS — C9000 Multiple myeloma not having achieved remission: Secondary | ICD-10-CM

## 2020-04-01 DIAGNOSIS — C7951 Secondary malignant neoplasm of bone: Secondary | ICD-10-CM | POA: Diagnosis not present

## 2020-04-01 DIAGNOSIS — Z5112 Encounter for antineoplastic immunotherapy: Secondary | ICD-10-CM | POA: Diagnosis not present

## 2020-04-01 DIAGNOSIS — C50922 Malignant neoplasm of unspecified site of left male breast: Secondary | ICD-10-CM | POA: Diagnosis not present

## 2020-04-01 DIAGNOSIS — Z5111 Encounter for antineoplastic chemotherapy: Secondary | ICD-10-CM | POA: Diagnosis not present

## 2020-04-01 LAB — CBC WITH DIFFERENTIAL/PLATELET
Abs Immature Granulocytes: 0.04 10*3/uL (ref 0.00–0.07)
Basophils Absolute: 0 10*3/uL (ref 0.0–0.1)
Basophils Relative: 0 %
Eosinophils Absolute: 0.2 10*3/uL (ref 0.0–0.5)
Eosinophils Relative: 2 %
HCT: 29.8 % — ABNORMAL LOW (ref 39.0–52.0)
Hemoglobin: 9.7 g/dL — ABNORMAL LOW (ref 13.0–17.0)
Immature Granulocytes: 1 %
Lymphocytes Relative: 14 %
Lymphs Abs: 0.9 10*3/uL (ref 0.7–4.0)
MCH: 35.7 pg — ABNORMAL HIGH (ref 26.0–34.0)
MCHC: 32.6 g/dL (ref 30.0–36.0)
MCV: 109.6 fL — ABNORMAL HIGH (ref 80.0–100.0)
Monocytes Absolute: 0.3 10*3/uL (ref 0.1–1.0)
Monocytes Relative: 5 %
Neutro Abs: 4.8 10*3/uL (ref 1.7–7.7)
Neutrophils Relative %: 78 %
Platelets: 56 10*3/uL — ABNORMAL LOW (ref 150–400)
RBC: 2.72 MIL/uL — ABNORMAL LOW (ref 4.22–5.81)
RDW: 13.4 % (ref 11.5–15.5)
WBC: 6.2 10*3/uL (ref 4.0–10.5)
nRBC: 0.3 % — ABNORMAL HIGH (ref 0.0–0.2)

## 2020-04-01 LAB — COMPREHENSIVE METABOLIC PANEL
ALT: 15 U/L (ref 0–44)
AST: 13 U/L — ABNORMAL LOW (ref 15–41)
Albumin: 3.4 g/dL — ABNORMAL LOW (ref 3.5–5.0)
Alkaline Phosphatase: 22 U/L — ABNORMAL LOW (ref 38–126)
Anion gap: 7 (ref 5–15)
BUN: 24 mg/dL — ABNORMAL HIGH (ref 8–23)
CO2: 25 mmol/L (ref 22–32)
Calcium: 9.1 mg/dL (ref 8.9–10.3)
Chloride: 104 mmol/L (ref 98–111)
Creatinine, Ser: 1.7 mg/dL — ABNORMAL HIGH (ref 0.61–1.24)
GFR calc Af Amer: 46 mL/min — ABNORMAL LOW (ref >60)
GFR calc non Af Amer: 40 mL/min — ABNORMAL LOW (ref >60)
Glucose, Bld: 106 mg/dL — ABNORMAL HIGH (ref 70–99)
Potassium: 4.2 mmol/L (ref 3.5–5.1)
Sodium: 136 mmol/L (ref 135–145)
Total Bilirubin: 0.5 mg/dL (ref 0.3–1.2)
Total Protein: 5.6 g/dL — ABNORMAL LOW (ref 6.5–8.1)

## 2020-04-01 LAB — LACTATE DEHYDROGENASE: LDH: 141 U/L (ref 98–192)

## 2020-04-01 MED ORDER — DARATUMUMAB-HYALURONIDASE-FIHJ 1800-30000 MG-UT/15ML ~~LOC~~ SOLN
1800.0000 mg | Freq: Once | SUBCUTANEOUS | Status: AC
  Administered 2020-04-01: 1800 mg via SUBCUTANEOUS
  Filled 2020-04-01: qty 15

## 2020-04-01 MED ORDER — DIPHENHYDRAMINE HCL 25 MG PO CAPS
50.0000 mg | ORAL_CAPSULE | Freq: Once | ORAL | Status: AC
  Administered 2020-04-01: 10:00:00 50 mg via ORAL
  Filled 2020-04-01: qty 2

## 2020-04-01 MED ORDER — DEXAMETHASONE 4 MG PO TABS: 20.0000 mg | ORAL_TABLET | Freq: Once | ORAL | Status: DC

## 2020-04-01 MED ORDER — ACETAMINOPHEN 325 MG PO TABS
650.0000 mg | ORAL_TABLET | Freq: Once | ORAL | Status: AC
  Administered 2020-04-01: 650 mg via ORAL
  Filled 2020-04-01: qty 2

## 2020-04-01 NOTE — Patient Instructions (Signed)
[facility] Discharge Instructions for Patients Receiving Chemotherapy   Beginning January 23rd 2017 lab work for the [facility] will be done in the  Main lab at [facility] on 1st floor. If you have a lab appointment with the [facility] please come in thru the  Main Entrance and check in at the main information desk   Today you received the following chemotherapy agents Darzalex injection. Follow-up as scheduled. Call clinic for any questions or concerns  To help prevent nausea and vomiting after your treatment, we encourage you to take your nausea medication   If you develop nausea and vomiting, or diarrhea that is not controlled by your medication, call the clinic.  The clinic phone number is [phone]. Office hours are Monday-Friday 8:30am-5:00pm.  BELOW ARE SYMPTOMS THAT SHOULD BE REPORTED IMMEDIATELY:  *FEVER GREATER THAN 101.0 F  *CHILLS WITH OR WITHOUT FEVER  NAUSEA AND VOMITING THAT IS NOT CONTROLLED WITH YOUR NAUSEA MEDICATION  *UNUSUAL SHORTNESS OF BREATH  *UNUSUAL BRUISING OR BLEEDING  TENDERNESS IN MOUTH AND THROAT WITH OR WITHOUT PRESENCE OF ULCERS  *URINARY PROBLEMS  *BOWEL PROBLEMS  UNUSUAL RASH Items with * indicate a potential emergency and should be followed up as soon as possible. If you have an emergency after office hours please contact your primary care physician or go to the nearest emergency department.  Please call the clinic during office hours if you have any questions or concerns.   You may also contact the Patient Navigator at [phone] should you have any questions or need assistance in obtaining follow up care.      Resources For Cancer Patients and their Caregivers ? American Cancer Society: Can assist with transportation, wigs, general needs, runs Look Good Feel Better.        206 223 3404 ? Cancer Care: Provides financial assistance, online support groups, medication/co-pay assistance.   1-800-813-HOPE (276)596-9829) ? [facility] Assists [place] cancer patients and their families through emotional , educational and financial support.  (575) 300-4924 ? [place] DSS Where to apply for food stamps, Medicaid and utility assistance. (508)321-0116 ? [facility]: Transportation to medical appointments. (519) 010-1786 ? Social Security Administration: May apply for disability if have a Stage IV cancer. (989) 349-0933 8722587291 ? [employer], Disability and Transit Services: Assists with nutrition, care and transit needs. (913)190-5041

## 2020-04-01 NOTE — Progress Notes (Signed)
0947 Lab results including Platelets,BUN and Creatinine reviewed with Dr. Delton Coombes and pt approved for Darzalex injection today per MD                                                                     Johnny Lucas tolerated Darzalex injection well without complaints or incident. VSS upon discharge. Pt discharged self ambulatory in satisfactory condition

## 2020-04-02 LAB — KAPPA/LAMBDA LIGHT CHAINS
Kappa free light chain: 171.8 mg/L — ABNORMAL HIGH (ref 3.3–19.4)
Kappa, lambda light chain ratio: 74.7 — ABNORMAL HIGH (ref 0.26–1.65)
Lambda free light chains: 2.3 mg/L — ABNORMAL LOW (ref 5.7–26.3)

## 2020-04-02 NOTE — Progress Notes (Signed)

## 2020-04-03 LAB — PROTEIN ELECTROPHORESIS, SERUM
A/G Ratio: 1.5 (ref 0.7–1.7)
Albumin ELP: 3.1 g/dL (ref 2.9–4.4)
Alpha-1-Globulin: 0.2 g/dL (ref 0.0–0.4)
Alpha-2-Globulin: 0.6 g/dL (ref 0.4–1.0)
Beta Globulin: 0.8 g/dL (ref 0.7–1.3)
Gamma Globulin: 0.4 g/dL (ref 0.4–1.8)
Globulin, Total: 2.1 g/dL — ABNORMAL LOW (ref 2.2–3.9)
M-Spike, %: 0.3 g/dL — ABNORMAL HIGH
Total Protein ELP: 5.2 g/dL — ABNORMAL LOW (ref 6.0–8.5)

## 2020-04-08 ENCOUNTER — Inpatient Hospital Stay (HOSPITAL_COMMUNITY): Payer: Medicare Other

## 2020-04-08 ENCOUNTER — Encounter (HOSPITAL_COMMUNITY): Payer: Self-pay | Admitting: Hematology

## 2020-04-08 ENCOUNTER — Inpatient Hospital Stay (HOSPITAL_BASED_OUTPATIENT_CLINIC_OR_DEPARTMENT_OTHER): Payer: Medicare Other | Admitting: Hematology

## 2020-04-08 ENCOUNTER — Other Ambulatory Visit: Payer: Self-pay

## 2020-04-08 ENCOUNTER — Encounter (HOSPITAL_COMMUNITY): Payer: Self-pay

## 2020-04-08 DIAGNOSIS — C9000 Multiple myeloma not having achieved remission: Secondary | ICD-10-CM

## 2020-04-08 DIAGNOSIS — Z5111 Encounter for antineoplastic chemotherapy: Secondary | ICD-10-CM | POA: Diagnosis not present

## 2020-04-08 DIAGNOSIS — Z170 Estrogen receptor positive status [ER+]: Secondary | ICD-10-CM | POA: Diagnosis not present

## 2020-04-08 DIAGNOSIS — Z5112 Encounter for antineoplastic immunotherapy: Secondary | ICD-10-CM | POA: Diagnosis not present

## 2020-04-08 DIAGNOSIS — C50922 Malignant neoplasm of unspecified site of left male breast: Secondary | ICD-10-CM | POA: Diagnosis not present

## 2020-04-08 DIAGNOSIS — C7951 Secondary malignant neoplasm of bone: Secondary | ICD-10-CM | POA: Diagnosis not present

## 2020-04-08 LAB — COMPREHENSIVE METABOLIC PANEL
ALT: 15 U/L (ref 0–44)
AST: 19 U/L (ref 15–41)
Albumin: 3.5 g/dL (ref 3.5–5.0)
Alkaline Phosphatase: 26 U/L — ABNORMAL LOW (ref 38–126)
Anion gap: 10 (ref 5–15)
BUN: 19 mg/dL (ref 8–23)
CO2: 25 mmol/L (ref 22–32)
Calcium: 9.3 mg/dL (ref 8.9–10.3)
Chloride: 102 mmol/L (ref 98–111)
Creatinine, Ser: 1.92 mg/dL — ABNORMAL HIGH (ref 0.61–1.24)
GFR calc Af Amer: 40 mL/min — ABNORMAL LOW (ref >60)
GFR calc non Af Amer: 34 mL/min — ABNORMAL LOW (ref >60)
Glucose, Bld: 100 mg/dL — ABNORMAL HIGH (ref 70–99)
Potassium: 4.7 mmol/L (ref 3.5–5.1)
Sodium: 137 mmol/L (ref 135–145)
Total Bilirubin: 0.6 mg/dL (ref 0.3–1.2)
Total Protein: 5.7 g/dL — ABNORMAL LOW (ref 6.5–8.1)

## 2020-04-08 LAB — CBC WITH DIFFERENTIAL/PLATELET
Abs Immature Granulocytes: 0.01 10*3/uL (ref 0.00–0.07)
Basophils Absolute: 0 10*3/uL (ref 0.0–0.1)
Basophils Relative: 1 %
Eosinophils Absolute: 0.2 10*3/uL (ref 0.0–0.5)
Eosinophils Relative: 4 %
HCT: 28.5 % — ABNORMAL LOW (ref 39.0–52.0)
Hemoglobin: 9.3 g/dL — ABNORMAL LOW (ref 13.0–17.0)
Immature Granulocytes: 0 %
Lymphocytes Relative: 16 %
Lymphs Abs: 0.8 10*3/uL (ref 0.7–4.0)
MCH: 36 pg — ABNORMAL HIGH (ref 26.0–34.0)
MCHC: 32.6 g/dL (ref 30.0–36.0)
MCV: 110.5 fL — ABNORMAL HIGH (ref 80.0–100.0)
Monocytes Absolute: 0.6 10*3/uL (ref 0.1–1.0)
Monocytes Relative: 13 %
Neutro Abs: 3.2 10*3/uL (ref 1.7–7.7)
Neutrophils Relative %: 66 %
Platelets: 197 10*3/uL (ref 150–400)
RBC: 2.58 MIL/uL — ABNORMAL LOW (ref 4.22–5.81)
RDW: 13.2 % (ref 11.5–15.5)
WBC: 4.8 10*3/uL (ref 4.0–10.5)
nRBC: 0 % (ref 0.0–0.2)

## 2020-04-08 MED ORDER — BORTEZOMIB CHEMO SQ INJECTION 3.5 MG (2.5MG/ML)
1.3000 mg/m2 | Freq: Once | INTRAMUSCULAR | Status: AC
  Administered 2020-04-08: 2.75 mg via SUBCUTANEOUS
  Filled 2020-04-08: qty 1.1

## 2020-04-08 MED ORDER — ACETAMINOPHEN 325 MG PO TABS
650.0000 mg | ORAL_TABLET | Freq: Once | ORAL | Status: AC
  Administered 2020-04-08: 650 mg via ORAL

## 2020-04-08 MED ORDER — DEXAMETHASONE 4 MG PO TABS: 20.0000 mg | ORAL_TABLET | Freq: Once | ORAL | Status: DC

## 2020-04-08 MED ORDER — DARATUMUMAB-HYALURONIDASE-FIHJ 1800-30000 MG-UT/15ML ~~LOC~~ SOLN
1800.0000 mg | Freq: Once | SUBCUTANEOUS | Status: AC
  Administered 2020-04-08: 1800 mg via SUBCUTANEOUS
  Filled 2020-04-08: qty 15

## 2020-04-08 MED ORDER — DIPHENHYDRAMINE HCL 25 MG PO CAPS
ORAL_CAPSULE | ORAL | Status: AC
  Filled 2020-04-08: qty 2

## 2020-04-08 MED ORDER — DIPHENHYDRAMINE HCL 25 MG PO CAPS
50.0000 mg | ORAL_CAPSULE | Freq: Once | ORAL | Status: AC
  Administered 2020-04-08: 10:00:00 50 mg via ORAL

## 2020-04-08 MED ORDER — ACETAMINOPHEN 325 MG PO TABS
ORAL_TABLET | ORAL | Status: AC
  Filled 2020-04-08: qty 2

## 2020-04-08 NOTE — Progress Notes (Signed)
Patient presents today for treatment and follow up visit with Dr. Delton Coombes. Creatinine 1.92 today. Hgb 9.3. Patient states he was wiped out after the Daratumumab injection per patient's words. Patient states he feels great today but wiped out for 4 days afterwards. Vital signs within parameters for treatment. Patient took Decadron 20mg  PO prior to arrival today.   Treatment given today per MD orders. Tolerated  without adverse affects. Vital signs stable. No complaints at this time. Discharged from clinic ambulatory. F/U with [facility] as scheduled.

## 2020-04-08 NOTE — Patient Instructions (Signed)

## 2020-04-08 NOTE — Progress Notes (Signed)
[address], [place] [address]   CLINIC:  Medical Oncology/Hematology  PCP:  [name], MD [place] [id number] [phone]   REASON FOR VISIT:  Follow-up for Multiple Myeloma  CURRENT THERAPY: Daratumumab, Velcade and dexamethasone.  BRIEF ONCOLOGIC HISTORY:  Oncology History  Breast cancer, male ([place])  12/31/2009 Initial Biopsy   Biopsy of L breast    12/31/2009 Pathology Results   Invasive ductal carcinoma, ER/PR+, HER 2 negative   12/31/2009 Imaging   Ultrasound showing a 2.43 x 1.85 x 3 cm hypoechoic spiculated mass in the 12 o clock L breast retroareolar region   01/01/2010 -  Anti-estrogen oral therapy   Tamoxifen 20 mg daily   01/06/2010 Imaging   Bone scan abnormal uptake in the diaphysis of the R humerus, abnormal in the R third, fifth and sixth ribs, lesion also noted in the sternum.   02/03/2010 Surgery   Rod placement and fixation of R humerus by Dr. [name]   02/05/2010 - 02/18/2010 Radiation Therapy   30Gy in 10 fractions of 3 Gy per fraction to R pathologic fracture   03/11/2010 -  Chemotherapy   Denosumab monthly, now every 3 months. Started at [facility]    06/09/2016 Imaging   Three hypermetabolic osseous lesions in the sternum, left ilium and right ilium, as discussed above, likely represent osseous metastases. At this time, these are not recognizable on the CT images. 2. No extra skeletal metastatic disease identified in the neck, chest, abdomen or pelvis.   10/13/2016 Progression   PET shows various new and enlarging osseous metastatic lesions with no definite extra osseous metastatic disease currently identified.    12/31/2016 Progression   1. Multifocal hypermetabolic osseous metastases throughout the axial and proximal appendicular skeleton, which are increased in size, number and metabolism since 10/13/2016 PET-CT. 2. 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 3. No additional sites of hypermetabolic metastatic disease. 4. Chronic right mastoid sinusitis. 5. Aortic atherosclerosis.  One vessel coronary atherosclerosis.   Multiple myeloma ([place])  02/12/2017 Bone Marrow Biopsy   The marrow was variably cellular with large peritrabecular aggregates of kappa restricted plasma cells (66% by aspirate, 30% by Cd138). Cytogenetics +11.    03/01/2017 - 06/29/2017 Chemotherapy   RVD    05/26/2017 Bone Marrow Biopsy   Performed at [facility]:  Plasma cell myeloma in a 30% cellularmarrow with decreased trilineage hematopoiesis and 42% kappalight chain restricted plasma cells on the aspirate smears andlarge aggregates on the core biopsy.    07/12/2017 - 09/01/2017 Chemotherapy   3 cycles of carfizolmib/cyclophosphamide/dexamethasone     10/08/2017 Bone Marrow Transplant   [facility]otrans[facility] at [facility]   02/28/2020 -  Chemotherapy   The patient had daratumumab-hyaluronidase-fihj (DARZALEX FASPRO) 1800-30000 MG-UT/15ML chemo SQ injection 1,800 mg, 1,800 mg, Subcutaneous,  Once, 3 of 12 cycles Administration: 1,800 mg (02/28/2020), 1,800 mg (03/05/2020), 1,800 mg (03/12/2020), 1,800 mg (03/18/2020), 1,800 mg (03/25/2020), 1,800 mg (04/01/2020), 1,800 mg (04/08/2020) bortezomib SQ (VELCADE) chemo injection 2.75 mg, 1.3 mg/m2 = 2.75 mg, Subcutaneous,  Once, 3 of 8 cycles Administration: 2.75 mg (02/28/2020), 2.75 mg (03/05/2020), 2.75 mg (03/08/2020), 2.75 mg (03/18/2020), 2.75 mg (03/21/2020), 2.75 mg (03/25/2020), 2.75 mg (03/28/2020), 2.75 mg (04/08/2020)  for chemotherapy treatment.    Multiple myeloma not having achieved remission ([place])  06/29/2017 Initial Diagnosis   Multiple myeloma not  having achieved remission ([place])   02/28/2020 -  Chemotherapy   The patient had daratumumab-hyaluronidase-fihj (DARZALEX FASPRO) 1800-30000 MG-UT/15ML chemo SQ injection 1,800 mg, 1,800 mg,  Subcutaneous,  Once, 3 of 12 cycles Administration: 1,800 mg (02/28/2020), 1,800 mg (03/05/2020), 1,800 mg (03/12/2020), 1,800 mg (03/18/2020), 1,800 mg (03/25/2020), 1,800 mg (04/01/2020), 1,800 mg (04/08/2020) bortezomib SQ (VELCADE) chemo injection 2.75 mg, 1.3 mg/m2 = 2.75 mg, Subcutaneous,  Once, 3 of 8 cycles Administration: 2.75 mg (02/28/2020), 2.75 mg (03/05/2020), 2.75 mg (03/08/2020), 2.75 mg (03/18/2020), 2.75 mg (03/21/2020), 2.75 mg (03/25/2020), 2.75 mg (03/28/2020), 2.75 mg (04/08/2020)  for chemotherapy treatment.       CANCER STAGING: Cancer Staging Multiple myeloma ([place]) Staging form: Plasma Cell Myeloma and Plasma Cell Disorders, AJCC 8th Edition - Clinical stage from 05/03/2017: Beta-2-microglobulin (mg/L): 3.5, Albumin (g/dL): 3.4, ISS: Stage II, High-risk cytogenetics: Absent, LDH: Not assessed - Signed by Baird Cancer, PA-C on 05/03/2017    INTERVAL HISTORY:  [name] 74 y.o. male seen for follow-up and toxicity assessment prior to next cycle of multiple myeloma treatment.  He is receiving daratumumab and Velcade.  He takes dexamethasone 20 mg weekly.  Denies any tingling or numbness in extremities.  No new onset pains reported.  Appetite is 100%.  Energy levels are 75%.  Denies any GI side effects.  Numbness has been stable.  REVIEW OF SYSTEMS:  Review of Systems  Neurological: Positive for numbness.  All other systems reviewed and are negative.    PAST MEDICAL/SURGICAL HISTORY:  Past Medical History:  Diagnosis Date  . Anxiety   . Bone metastases ([place]) 09/10/2016  . Breast cancer ([place]) 2011   Stave IV breast cancer; radiation and tamoxifen  . Breast cancer, male ([place])    Stave IV breast cancer; radiation and tamoxifen  Overview:  Left breast ca with mets bone  Overview:  METS TO BONE  . GERD (gastroesophageal reflux disease)   . Hypertension   . Macular degeneration   . Multiple myeloma ([place]) 02/18/2017  . Peripheral neuropathy    Past Surgical History:  Procedure  Laterality Date  . BACK SURGERY    . CATARACT EXTRACTION W/PHACO Left 02/03/2019   Procedure: CATARACT EXTRACTION PHACO AND INTRAOCULAR LENS PLACEMENT ([place]);  Surgeon: Baruch Goldmann, MD;  Location: [facility];  Service: Ophthalmology;  Laterality: Left;  CDE: 15.89  . HERNIA REPAIR    . PORTACATH PLACEMENT Left 06/10/2018   Procedure: INSERTION PORT-A-CATH;  Surgeon: Virl Cagey, MD;  Location: [facility];  Service: General;  Laterality: Left;  . right arm surgery       SOCIAL HISTORY:  Social History   Socioeconomic History  . Marital status: Married    Spouse name: Not on file  . Number of children: 3  . Years of education: Not on file  . Highest education level: Not on file  Occupational History  . Not on file  Tobacco Use  . Smoking status: Never Smoker  . Smokeless tobacco: Former Network engineer and Sexual Activity  . Alcohol use: Yes    Alcohol/week: 24.0 standard drinks    Types: 24 Cans of beer per week  . Drug use: No  . Sexual activity: Not on file  Other Topics Concern  . Not on file  Social History Narrative  . Not on file   Social Determinants of Health   Financial Resource Strain:   . Difficulty of Paying Living Expenses:   Food Insecurity:   . Worried About [employer] of  Food in the Last Year:   . [place] in the Last Year:   Transportation Needs:   . Film/video editor (Medical):   Marland Kitchen Lack of Transportation (Non-Medical):   Physical Activity:   . Days of Exercise per Week:   . Minutes of Exercise per Session:   Stress:   . Feeling of Stress :   Social Connections:   . Frequency of Communication with Friends and Family:   . Frequency of Social Gatherings with Friends and Family:   . Attends Religious Services:   . Active Member of Clubs or Organizations:   . Attends Archivist Meetings:   Marland Kitchen Marital Status:   Intimate Partner Violence:   . Fear of Current or Ex-Partner:   . Emotionally Abused:   Marland Kitchen Physically Abused:   .  Sexually Abused:     FAMILY HISTORY:  Family History  Problem Relation Age of Onset  . Stroke Mother   . Cancer Maternal Aunt        cancer NOS; died in her 35s  . Lung cancer Maternal Uncle        smoker    CURRENT MEDICATIONS:  Outpatient Encounter Medications as of 04/08/2020  Medication Sig Note  . acyclovir (ZOVIRAX) 800 MG tablet TAKE 1 TABLET TWICE DAILY   . amLODipine (NORVASC) 5 MG tablet Take 1 tablet (5 mg total) by mouth daily.   Marland Kitchen aspirin EC 81 MG tablet Take 81 mg by mouth daily.   . calcium-vitamin D (OSCAL WITH D) 500-200 MG-UNIT TABS tablet Take 1 tablet by mouth 2 (two) times daily.   Marland Kitchen CARFILZOMIB IV Inject into the vein.   . Cholecalciferol (VITAMIN D) 50 MCG (2000 UT) CAPS Take 4,000 Units by mouth 2 (two) times daily.   Marland Kitchen denosumab (XGEVA) 120 MG/1.7ML SOLN injection Inject 120 mg into the skin once. 01/18/2019: [facility]   . dexamethasone (DECADRON) 4 MG tablet Take 5 pills before each treatment   . folic acid (FOLVITE) 1 MG tablet TAKE 1 TABLET BY MOUTH DAILY   . gabapentin (NEURONTIN) 300 MG capsule Take 1 capsule (300 mg total) by mouth 2 (two) times daily.   . hydrALAZINE (APRESOLINE) 50 MG tablet Take 1 tablet (50 mg total) by mouth every 8 (eight) hours. (Patient taking differently: Take 50 mg by mouth 2 (two) times daily. )   . loratadine (CLARITIN) 10 MG tablet Take 1 tablet (10 mg total) by mouth daily.   . metoprolol succinate (TOPROL-XL) 100 MG 24 hr tablet Take 100 mg by mouth daily.    . Multiple Vitamin (MULTIVITAMIN WITH MINERALS) TABS tablet Take 1 tablet by mouth daily.   . pantoprazole (PROTONIX) 40 MG tablet Take 40 mg by mouth every other day.    . tamoxifen (NOLVADEX) 20 MG tablet TAKE 1 TABLET (20 MG TOTAL) BY MOUTH DAILY.   . vitamin B-12 (CYANOCOBALAMIN) 1000 MCG tablet Take 1,000 mcg by mouth daily.   Marland Kitchen ALPRAZolam (XANAX) 0.5 MG tablet TAKE 1 TABLET BY MOUTH AT BEDTIME AS NEEDED FOR ANXIETY OR SLEEP   . augmented betamethasone  dipropionate (DIPROLENE-AF) 0.05 % cream Apply topically as needed.    . diclofenac Sodium (VOLTAREN) 1 % GEL Use 1 to 2 times per day as directed by doctor   . ondansetron (ZOFRAN) 8 MG tablet TAKE ONE TABLET BY MOUTH EVERY 8 HOURS AS NEEDED   . polyethylene glycol (MIRALAX / GLYCOLAX) packet Take 17 g by mouth daily as  needed for mild constipation.   . prochlorperazine (COMPAZINE) 10 MG tablet Take 1 tablet (10 mg total) by mouth every 6 (six) hours as needed for nausea or vomiting.   . pseudoephedrine-guaifenesin (MUCINEX D) 60-600 MG 12 hr tablet Take 1 tablet by mouth 2 (two) times daily as needed for congestion.    . [DISCONTINUED] ALPRAZolam (XANAX) 0.5 MG tablet TAKE 1 TABLET BY MOUTH AT BEDTIME AS NEEDED FOR ANXIETY OR SLEEP    No facility-administered encounter medications on file as of 04/08/2020.    ALLERGIES:  Allergies  Allergen Reactions  . Cefepime     Suspected severe thrombocytopenia is a result of cefepime induced antigen platelet destruction     PHYSICAL EXAM:  ECOG Performance status: 1  Vitals:   04/08/20 0927  BP: (!) 115/56  Pulse: 70  Resp: 18  Temp: (!) 97.1 F (36.2 C)  SpO2: 97%   Filed Weights   04/08/20 0927  Weight: 217 lb 9.6 oz (98.7 kg)    Physical Exam Constitutional:      Appearance: Normal appearance.  HENT:     Head: Normocephalic.  Eyes:     Extraocular Movements: Extraocular movements intact.     Conjunctiva/sclera: Conjunctivae normal.  Cardiovascular:     Rate and Rhythm: Normal rate and regular rhythm.     Pulses: Normal pulses.     Heart sounds: Normal heart sounds.  Pulmonary:     Effort: Pulmonary effort is normal.     Breath sounds: Normal breath sounds.  Musculoskeletal:        General: Normal range of motion.     Cervical back: Normal range of motion.  Skin:    General: Skin is warm and dry.  Neurological:     General: No focal deficit present.     Mental Status: He is alert and oriented to person, place, and  time. Mental status is at baseline.  Psychiatric:        Mood and Affect: Mood normal.        Behavior: Behavior normal.        Thought Content: Thought content normal.        Judgment: Judgment normal.      LABORATORY DATA:  I have reviewed the labs as listed.  CBC    Component Value Date/Time   WBC 4.8 04/08/2020 0848   RBC 2.58 (L) 04/08/2020 0848   HGB 9.3 (L) 04/08/2020 0848   HCT 28.5 (L) 04/08/2020 0848   PLT 197 04/08/2020 0848   MCV 110.5 (H) 04/08/2020 0848   MCH 36.0 (H) 04/08/2020 0848   MCHC 32.6 04/08/2020 0848   RDW 13.2 04/08/2020 0848   LYMPHSABS 0.8 04/08/2020 0848   MONOABS 0.6 04/08/2020 0848   EOSABS 0.2 04/08/2020 0848   BASOSABS 0.0 04/08/2020 0848   CMP Latest Ref Rng & Units 04/08/2020 04/01/2020 03/25/2020  Glucose 70 - 99 mg/dL 100(H) 106(H) 99  BUN 8 - 23 mg/dL 19 24(H) 22  Creatinine 0.61 - 1.24 mg/dL 1.92(H) 1.70(H) 1.74(H)  Sodium 135 - 145 mmol/L 137 136 138  Potassium 3.5 - 5.1 mmol/L 4.7 4.2 4.5  Chloride 98 - 111 mmol/L 102 104 105  CO2 22 - 32 mmol/L '25 25 27  '$ Calcium 8.9 - 10.3 mg/dL 9.3 9.1 9.0  Total Protein 6.5 - 8.1 g/dL 5.7(L) 5.6(L) 5.8(L)  Total Bilirubin 0.3 - 1.2 mg/dL 0.6 0.5 0.4  Alkaline Phos 38 - 126 U/L 26(L) 22(L) 23(L)  AST 15 - 41 U/L 19  13(L) 13(L)  ALT 0 - 44 U/L '15 15 15    '$ Radiology: I have reviewed scans.  ASSESSMENT & PLAN:   Multiple myeloma ([place]) 1.  Relapsed multiple myeloma: -2 cycles of daratumumab, bortezomib and dexamethasone on 02/28/2020 and 03/18/2020. -PET scan on 02/18/2020 showed left supraclavicular lymph node and largely improved diffuse bone lesions. -Myeloma panel from 04/01/2020 shows M spike 0.3 g, previously 0.4 g.  Kappa light chains are 171.8, previously 172.5.  Lambda light chains at 2.3 with ratio of 74.7. -We reviewed CBC today which showed white count is 4.8 with platelet count 197.  Creatinine is 1.92, up from 1.7. -He will proceed with cycle 3.  I plan to see him back in 3 weeks.  I  will check his myeloma panel at that time.  If there is no improvement, we will discontinue Velcade and start Pomalyst.  2.  Myeloma bone disease: -Denosumab started on 03/02/2020.  He is tolerating it very well.  3.  ID prophylaxis: -She will continue acyclovir twice daily.  4.  Metastatic left breast cancer to bones: -he will continue tamoxifen daily.  5.  Peripheral neuropathy: -he will continue gabapentin 300 mg twice daily.   Orders placed this encounter:  No orders of the defined types were placed in this encounter.     Derek Jack, MD [place] [phone]

## 2020-04-08 NOTE — Assessment & Plan Note (Signed)
1.  Relapsed multiple myeloma: -2 cycles of daratumumab, bortezomib and dexamethasone on 02/28/2020 and 03/18/2020. -PET scan on 02/18/2020 showed left supraclavicular lymph node and largely improved diffuse bone lesions. -Myeloma panel from 04/01/2020 shows M spike 0.3 g, previously 0.4 g.  Kappa light chains are 171.8, previously 172.5.  Lambda light chains at 2.3 with ratio of 74.7. -We reviewed CBC today which showed white count is 4.8 with platelet count 197.  Creatinine is 1.92, up from 1.7. -He will proceed with cycle 3.  I plan to see him back in 3 weeks.  I will check his myeloma panel at that time.  If there is no improvement, we will discontinue Velcade and start Pomalyst.  2.  Myeloma bone disease: -Denosumab started on 03/02/2020.  He is tolerating it very well.  3.  ID prophylaxis: -She will continue acyclovir twice daily.  4.  Metastatic left breast cancer to bones: -he will continue tamoxifen daily.  5.  Peripheral neuropathy: -he will continue gabapentin 300 mg twice daily.

## 2020-04-08 NOTE — Progress Notes (Signed)
Pharmacist Chemotherapy Monitoring - Follow Up Assessment    I verify that I have reviewed each item in the below checklist:  . Regimen for the patient is scheduled for the appropriate day and plan matches scheduled date. Marland Kitchen Appropriate non-routine labs are ordered dependent on drug ordered. . If applicable, additional medications reviewed and ordered per protocol based on lifetime cumulative doses and/or treatment regimen.   Plan for follow-up and/or issues identified: No . I-vent associated with next due treatment: No . MD and/or nursing notified: No  Delane Ginger 04/08/2020 11:42 AM

## 2020-04-08 NOTE — Patient Instructions (Addendum)
[place] at [facility] Discharge Instructions  You were seen today by Dr. Delton Coombes. He went over your recent lab results. Continue treatments as scheduled. He will see you back in 3 weeks for labs and follow up.   Thank you for choosing [place] at [facility] to provide your oncology and hematology care.  To afford each patient quality time with our provider, please arrive at least 15 minutes before your scheduled appointment time.   If you have a lab appointment with the [facility] please come in thru the  Main Entrance and check in at the main information desk  You need to re-schedule your appointment should you arrive 10 or more minutes late.  We strive to give you quality time with our providers, and arriving late affects you and other patients whose appointments are after yours.  Also, if you no show three or more times for appointments you may be dismissed from the clinic at the providers discretion.     Again, thank you for choosing [facility].  Our hope is that these requests will decrease the amount of time that you wait before being seen by our physicians.       _____________________________________________________________  Should you have questions after your visit to [facility], please contact our office at [phone] between the hours of 8:00 a.m. and 4:30 p.m.  Voicemails left after 4:00 p.m. will not be returned until the following business day.  For prescription refill requests, have your pharmacy contact our office and allow 72 hours.    Cancer Center Support Programs:   > Cancer Support Group  2nd Tuesday of the month 1pm-2pm, Journey Room

## 2020-04-08 NOTE — Progress Notes (Signed)
Patient has been assessed, vital signs and labs have been reviewed by Dr. Katragadda. ANC, Creatinine, LFTs, and Platelets are within treatment parameters per Dr. Katragadda. The patient is good to proceed with treatment at this time.  

## 2020-04-11 ENCOUNTER — Encounter (HOSPITAL_COMMUNITY): Payer: Self-pay

## 2020-04-11 ENCOUNTER — Other Ambulatory Visit: Payer: Self-pay

## 2020-04-11 ENCOUNTER — Inpatient Hospital Stay (HOSPITAL_COMMUNITY): Payer: Medicare Other

## 2020-04-11 VITALS — BP 116/55 | HR 73 | Temp 97.1°F | Resp 18 | Wt 217.8 lb

## 2020-04-11 DIAGNOSIS — Z5112 Encounter for antineoplastic immunotherapy: Secondary | ICD-10-CM | POA: Diagnosis not present

## 2020-04-11 DIAGNOSIS — C9000 Multiple myeloma not having achieved remission: Secondary | ICD-10-CM | POA: Diagnosis not present

## 2020-04-11 DIAGNOSIS — C7951 Secondary malignant neoplasm of bone: Secondary | ICD-10-CM | POA: Diagnosis not present

## 2020-04-11 DIAGNOSIS — Z170 Estrogen receptor positive status [ER+]: Secondary | ICD-10-CM | POA: Diagnosis not present

## 2020-04-11 DIAGNOSIS — Z5111 Encounter for antineoplastic chemotherapy: Secondary | ICD-10-CM | POA: Diagnosis not present

## 2020-04-11 DIAGNOSIS — C50922 Malignant neoplasm of unspecified site of left male breast: Secondary | ICD-10-CM | POA: Diagnosis not present

## 2020-04-11 MED ORDER — BORTEZOMIB CHEMO SQ INJECTION 3.5 MG (2.5MG/ML)
1.3000 mg/m2 | Freq: Once | INTRAMUSCULAR | Status: AC
  Administered 2020-04-11: 10:00:00 2.75 mg via SUBCUTANEOUS
  Filled 2020-04-11: qty 1.1

## 2020-04-11 NOTE — Progress Notes (Signed)
Johnny Lucas tolerated Velcade injection well without complaints or incident. VSS Pt discharged self ambulatory in satisfactory condition

## 2020-04-11 NOTE — Patient Instructions (Signed)
[facility] Discharge Instructions for Patients Receiving Chemotherapy   Beginning January 23rd 2017 lab work for the Cancer Center will be done in the  Main lab at [place] on 1st floor. If you have a lab appointment with the Cancer Center please come in thru the  Main Entrance and check in at the main information desk   Today you received the following chemotherapy agents Velcade injection. Follow-up as scheduled. Call clinic for any questions or concerns  To help prevent nausea and vomiting after your treatment, we encourage you to take your nausea medication   If you develop nausea and vomiting, or diarrhea that is not controlled by your medication, call the clinic.  The clinic phone number is [phone]. Office hours are Monday-Friday 8:30am-5:00pm.  BELOW ARE SYMPTOMS THAT SHOULD BE REPORTED IMMEDIATELY:  *FEVER GREATER THAN 101.0 F  *CHILLS WITH OR WITHOUT FEVER  NAUSEA AND VOMITING THAT IS NOT CONTROLLED WITH YOUR NAUSEA MEDICATION  *UNUSUAL SHORTNESS OF BREATH  *UNUSUAL BRUISING OR BLEEDING  TENDERNESS IN MOUTH AND THROAT WITH OR WITHOUT PRESENCE OF ULCERS  *URINARY PROBLEMS  *BOWEL PROBLEMS  UNUSUAL RASH Items with * indicate a potential emergency and should be followed up as soon as possible. If you have an emergency after office hours please contact your primary care physician or go to the nearest emergency department.  Please call the clinic during office hours if you have any questions or concerns.   You may also contact the Patient Navigator at [phone] should you have any questions or need assistance in obtaining follow up care.      Resources For Cancer Patients and their Caregivers ? American Cancer Society: Can assist with transportation, wigs, general needs, runs Look Good Feel Better.        1-888-227-6333 ? Cancer Care: Provides financial assistance, online support groups, medication/co-pay assistance.   1-800-813-HOPE (4673) ? [facility] Assists [place] cancer patients and their families through emotional , educational and financial support.  336-427-4357 ? [place] DSS Where to apply for food stamps, Medicaid and utility assistance. 336-342-1394 ? [facility]: Transportation to medical appointments. 336-347-2287 ? Social Security Administration: May apply for disability if have a Stage IV cancer. 336-342-7796 1-800-772-1213 ? [place] Aging, Disability and Transit Services: Assists with nutrition, care and transit needs. 336-349-2343         

## 2020-04-15 ENCOUNTER — Other Ambulatory Visit: Payer: Self-pay

## 2020-04-15 ENCOUNTER — Inpatient Hospital Stay (HOSPITAL_COMMUNITY): Payer: Medicare Other

## 2020-04-15 VITALS — BP 111/58 | HR 66 | Temp 97.1°F | Resp 18 | Wt 219.4 lb

## 2020-04-15 DIAGNOSIS — Z5111 Encounter for antineoplastic chemotherapy: Secondary | ICD-10-CM | POA: Diagnosis not present

## 2020-04-15 DIAGNOSIS — C9000 Multiple myeloma not having achieved remission: Secondary | ICD-10-CM

## 2020-04-15 DIAGNOSIS — C50922 Malignant neoplasm of unspecified site of left male breast: Secondary | ICD-10-CM | POA: Diagnosis not present

## 2020-04-15 DIAGNOSIS — C7951 Secondary malignant neoplasm of bone: Secondary | ICD-10-CM | POA: Diagnosis not present

## 2020-04-15 DIAGNOSIS — Z170 Estrogen receptor positive status [ER+]: Secondary | ICD-10-CM | POA: Diagnosis not present

## 2020-04-15 DIAGNOSIS — Z5112 Encounter for antineoplastic immunotherapy: Secondary | ICD-10-CM | POA: Diagnosis not present

## 2020-04-15 LAB — CBC WITH DIFFERENTIAL/PLATELET
Abs Immature Granulocytes: 0.05 10*3/uL (ref 0.00–0.07)
Basophils Absolute: 0 10*3/uL (ref 0.0–0.1)
Basophils Relative: 0 %
Eosinophils Absolute: 0.2 10*3/uL (ref 0.0–0.5)
Eosinophils Relative: 2 %
HCT: 28.7 % — ABNORMAL LOW (ref 39.0–52.0)
Hemoglobin: 9.4 g/dL — ABNORMAL LOW (ref 13.0–17.0)
Immature Granulocytes: 1 %
Lymphocytes Relative: 9 %
Lymphs Abs: 0.6 10*3/uL — ABNORMAL LOW (ref 0.7–4.0)
MCH: 36 pg — ABNORMAL HIGH (ref 26.0–34.0)
MCHC: 32.8 g/dL (ref 30.0–36.0)
MCV: 110 fL — ABNORMAL HIGH (ref 80.0–100.0)
Monocytes Absolute: 0.7 10*3/uL (ref 0.1–1.0)
Monocytes Relative: 10 %
Neutro Abs: 5.2 10*3/uL (ref 1.7–7.7)
Neutrophils Relative %: 78 %
Platelets: 127 10*3/uL — ABNORMAL LOW (ref 150–400)
RBC: 2.61 MIL/uL — ABNORMAL LOW (ref 4.22–5.81)
RDW: 13.3 % (ref 11.5–15.5)
WBC: 6.6 10*3/uL (ref 4.0–10.5)
nRBC: 0 % (ref 0.0–0.2)

## 2020-04-15 LAB — COMPREHENSIVE METABOLIC PANEL
ALT: 15 U/L (ref 0–44)
AST: 16 U/L (ref 15–41)
Albumin: 3.5 g/dL (ref 3.5–5.0)
Alkaline Phosphatase: 23 U/L — ABNORMAL LOW (ref 38–126)
Anion gap: 11 (ref 5–15)
BUN: 26 mg/dL — ABNORMAL HIGH (ref 8–23)
CO2: 24 mmol/L (ref 22–32)
Calcium: 9.4 mg/dL (ref 8.9–10.3)
Chloride: 100 mmol/L (ref 98–111)
Creatinine, Ser: 1.94 mg/dL — ABNORMAL HIGH (ref 0.61–1.24)
GFR calc Af Amer: 39 mL/min — ABNORMAL LOW (ref >60)
GFR calc non Af Amer: 34 mL/min — ABNORMAL LOW (ref >60)
Glucose, Bld: 105 mg/dL — ABNORMAL HIGH (ref 70–99)
Potassium: 4.5 mmol/L (ref 3.5–5.1)
Sodium: 135 mmol/L (ref 135–145)
Total Bilirubin: 0.5 mg/dL (ref 0.3–1.2)
Total Protein: 5.8 g/dL — ABNORMAL LOW (ref 6.5–8.1)

## 2020-04-15 LAB — LACTATE DEHYDROGENASE: LDH: 152 U/L (ref 98–192)

## 2020-04-15 MED ORDER — DARATUMUMAB-HYALURONIDASE-FIHJ 1800-30000 MG-UT/15ML ~~LOC~~ SOLN
1800.0000 mg | Freq: Once | SUBCUTANEOUS | Status: AC
  Administered 2020-04-15: 12:00:00 1800 mg via SUBCUTANEOUS
  Filled 2020-04-15: qty 15

## 2020-04-15 MED ORDER — DEXAMETHASONE 4 MG PO TABS: 20.0000 mg | ORAL_TABLET | Freq: Once | ORAL | Status: DC

## 2020-04-15 MED ORDER — ACETAMINOPHEN 325 MG PO TABS
ORAL_TABLET | ORAL | Status: AC
  Filled 2020-04-15: qty 2

## 2020-04-15 MED ORDER — DIPHENHYDRAMINE HCL 25 MG PO CAPS
50.0000 mg | ORAL_CAPSULE | Freq: Once | ORAL | Status: AC
  Administered 2020-04-15: 11:00:00 50 mg via ORAL

## 2020-04-15 MED ORDER — BORTEZOMIB CHEMO SQ INJECTION 3.5 MG (2.5MG/ML)
1.3000 mg/m2 | Freq: Once | INTRAMUSCULAR | Status: AC
  Administered 2020-04-15: 2.75 mg via SUBCUTANEOUS
  Filled 2020-04-15: qty 1.1

## 2020-04-15 MED ORDER — ACETAMINOPHEN 325 MG PO TABS
650.0000 mg | ORAL_TABLET | Freq: Once | ORAL | Status: AC
  Administered 2020-04-15: 11:00:00 650 mg via ORAL

## 2020-04-15 MED ORDER — DIPHENHYDRAMINE HCL 25 MG PO CAPS
ORAL_CAPSULE | ORAL | Status: AC
  Filled 2020-04-15: qty 2

## 2020-04-15 NOTE — Patient Instructions (Signed)
[facility] Discharge Instructions for Patients Receiving Chemotherapy   Beginning January 23rd 2017 lab work for the Cancer Center will be done in the  Main lab at [place] on 1st floor. If you have a lab appointment with the Cancer Center please come in thru the  Main Entrance and check in at the main information desk   Today you received the following chemotherapy agents Velcade and Daratumumab  To help prevent nausea and vomiting after your treatment, we encourage you to take your nausea medication   If you develop nausea and vomiting, or diarrhea that is not controlled by your medication, call the clinic.  The clinic phone number is [phone]. Office hours are Monday-Friday 8:30am-5:00pm.  BELOW ARE SYMPTOMS THAT SHOULD BE REPORTED IMMEDIATELY:  *FEVER GREATER THAN 101.0 F  *CHILLS WITH OR WITHOUT FEVER  NAUSEA AND VOMITING THAT IS NOT CONTROLLED WITH YOUR NAUSEA MEDICATION  *UNUSUAL SHORTNESS OF BREATH  *UNUSUAL BRUISING OR BLEEDING  TENDERNESS IN MOUTH AND THROAT WITH OR WITHOUT PRESENCE OF ULCERS  *URINARY PROBLEMS  *BOWEL PROBLEMS  UNUSUAL RASH Items with * indicate a potential emergency and should be followed up as soon as possible. If you have an emergency after office hours please contact your primary care physician or go to the nearest emergency department.  Please call the clinic during office hours if you have any questions or concerns.   You may also contact the Patient Navigator at [phone] should you have any questions or need assistance in obtaining follow up care.      Resources For Cancer Patients and their Caregivers ? American Cancer Society: Can assist with transportation, wigs, general needs, runs Look Good Feel Better.        1-888-227-6333 ? Cancer Care: Provides financial assistance, online support groups, medication/co-pay assistance.  1-800-813-HOPE (4673) ? [facility] Assists  [place] cancer patients and their families through emotional , educational and financial support.  336-427-4357 ? [place] DSS Where to apply for food stamps, Medicaid and utility assistance. 336-342-1394 ? [facility]: Transportation to medical appointments. 336-347-2287 ? Social Security Administration: May apply for disability if have a Stage IV cancer. 336-342-7796 1-800-772-1213 ? [place] Aging, Disability and Transit Services: Assists with nutrition, care and transit needs. 336-349-2343          

## 2020-04-15 NOTE — Progress Notes (Signed)
Johnny Lucas presents today for Daratumumab and Velcade injections. Lab results and vital reviewed with Dr. Delton Coombes, including Cr 1.94. Per MD, ok to proceed with treatment today.  Injections tolerated into abdomen without incident or complaint. Sites clean and dry, bandaid applied. Discharged in satisfactory condition with follow up instructions.

## 2020-04-16 LAB — KAPPA/LAMBDA LIGHT CHAINS
Kappa free light chain: 172.7 mg/L — ABNORMAL HIGH (ref 3.3–19.4)
Kappa, lambda light chain ratio: 63.96 — ABNORMAL HIGH (ref 0.26–1.65)
Lambda free light chains: 2.7 mg/L — ABNORMAL LOW (ref 5.7–26.3)

## 2020-04-17 LAB — PROTEIN ELECTROPHORESIS, SERUM
A/G Ratio: 1.7 (ref 0.7–1.7)
Albumin ELP: 3.3 g/dL (ref 2.9–4.4)
Alpha-1-Globulin: 0.2 g/dL (ref 0.0–0.4)
Alpha-2-Globulin: 0.6 g/dL (ref 0.4–1.0)
Beta Globulin: 0.8 g/dL (ref 0.7–1.3)
Gamma Globulin: 0.5 g/dL (ref 0.4–1.8)
Globulin, Total: 2 g/dL — ABNORMAL LOW (ref 2.2–3.9)
M-Spike, %: 0.3 g/dL — ABNORMAL HIGH
Total Protein ELP: 5.3 g/dL — ABNORMAL LOW (ref 6.0–8.5)

## 2020-04-18 ENCOUNTER — Other Ambulatory Visit: Payer: Self-pay

## 2020-04-18 ENCOUNTER — Inpatient Hospital Stay (HOSPITAL_COMMUNITY): Payer: Medicare Other

## 2020-04-18 VITALS — BP 98/62 | HR 72 | Temp 96.8°F | Resp 18

## 2020-04-18 DIAGNOSIS — Z5112 Encounter for antineoplastic immunotherapy: Secondary | ICD-10-CM | POA: Diagnosis not present

## 2020-04-18 DIAGNOSIS — C9000 Multiple myeloma not having achieved remission: Secondary | ICD-10-CM | POA: Diagnosis not present

## 2020-04-18 DIAGNOSIS — Z170 Estrogen receptor positive status [ER+]: Secondary | ICD-10-CM | POA: Diagnosis not present

## 2020-04-18 DIAGNOSIS — C7951 Secondary malignant neoplasm of bone: Secondary | ICD-10-CM | POA: Diagnosis not present

## 2020-04-18 DIAGNOSIS — Z5111 Encounter for antineoplastic chemotherapy: Secondary | ICD-10-CM | POA: Diagnosis not present

## 2020-04-18 DIAGNOSIS — C50922 Malignant neoplasm of unspecified site of left male breast: Secondary | ICD-10-CM | POA: Diagnosis not present

## 2020-04-18 MED ORDER — BORTEZOMIB CHEMO SQ INJECTION 3.5 MG (2.5MG/ML)
1.3000 mg/m2 | Freq: Once | INTRAMUSCULAR | Status: AC
  Administered 2020-04-18: 2.75 mg via SUBCUTANEOUS
  Filled 2020-04-18: qty 1.1

## 2020-04-18 NOTE — Progress Notes (Addendum)
Johnny Lucas presents today for D11C3 Velcade injection. Vitals stable. Pt denies any new complaints or symptoms since last injections. Velcade injection into right abdomen tolerated without incident or complaint. Discharged in satisfactory condition with follow up instructions.

## 2020-04-18 NOTE — Patient Instructions (Signed)
[facility] Discharge Instructions for Patients Receiving Chemotherapy   Beginning January 23rd 2017 lab work for the Cancer Center will be done in the  Main lab at [facility] on 1st floor. If you have a lab appointment with the Cancer Center please come in thru the  Main Entrance and check in at the main information desk   Today you received the following chemotherapy agents Velcade  To help prevent nausea and vomiting after your treatment, we encourage you to take your nausea medication    If you develop nausea and vomiting, or diarrhea that is not controlled by your medication, call the clinic.  The clinic phone number is [phone]. Office hours are Monday-Friday 8:30am-5:00pm.  BELOW ARE SYMPTOMS THAT SHOULD BE REPORTED IMMEDIATELY:  *FEVER GREATER THAN 101.0 F  *CHILLS WITH OR WITHOUT FEVER  NAUSEA AND VOMITING THAT IS NOT CONTROLLED WITH YOUR NAUSEA MEDICATION  *UNUSUAL SHORTNESS OF BREATH  *UNUSUAL BRUISING OR BLEEDING  TENDERNESS IN MOUTH AND THROAT WITH OR WITHOUT PRESENCE OF ULCERS  *URINARY PROBLEMS  *BOWEL PROBLEMS  UNUSUAL RASH Items with * indicate a potential emergency and should be followed up as soon as possible. If you have an emergency after office hours please contact your primary care physician or go to the nearest emergency department.  Please call the clinic during office hours if you have any questions or concerns.   You may also contact the Patient Navigator at [phone] should you have any questions or need assistance in obtaining follow up care.      Resources For Cancer Patients and their Caregivers ? American Cancer Society: Can assist with transportation, wigs, general needs, runs Look Good Feel Better.        1-888-227-6333 ? Cancer Care: Provides financial assistance, online support groups, medication/co-pay assistance.  1-800-813-HOPE (4673) ? [facility] Assists [place] cancer  patients and their families through emotional , educational and financial support.  336-427-4357 ? [place] DSS Where to apply for food stamps, Medicaid and utility assistance. 336-342-1394 ? [facility]: Transportation to medical appointments. 336-347-2287 ? Social Security Administration: May apply for disability if have a Stage IV cancer. 336-342-7796 1-800-772-1213 ? [place] Aging, Disability and Transit Services: Assists with nutrition, care and transit needs. 336-349-2343          

## 2020-04-22 ENCOUNTER — Inpatient Hospital Stay (HOSPITAL_COMMUNITY): Payer: Medicare Other

## 2020-04-22 ENCOUNTER — Other Ambulatory Visit: Payer: Self-pay

## 2020-04-22 ENCOUNTER — Encounter (HOSPITAL_COMMUNITY): Payer: Self-pay

## 2020-04-22 VITALS — BP 109/59 | HR 65 | Temp 96.8°F | Resp 18 | Wt 222.4 lb

## 2020-04-22 DIAGNOSIS — C9000 Multiple myeloma not having achieved remission: Secondary | ICD-10-CM | POA: Diagnosis not present

## 2020-04-22 DIAGNOSIS — C7951 Secondary malignant neoplasm of bone: Secondary | ICD-10-CM | POA: Diagnosis not present

## 2020-04-22 DIAGNOSIS — Z5112 Encounter for antineoplastic immunotherapy: Secondary | ICD-10-CM | POA: Diagnosis not present

## 2020-04-22 DIAGNOSIS — Z5111 Encounter for antineoplastic chemotherapy: Secondary | ICD-10-CM | POA: Diagnosis not present

## 2020-04-22 DIAGNOSIS — C50922 Malignant neoplasm of unspecified site of left male breast: Secondary | ICD-10-CM | POA: Diagnosis not present

## 2020-04-22 DIAGNOSIS — Z170 Estrogen receptor positive status [ER+]: Secondary | ICD-10-CM | POA: Diagnosis not present

## 2020-04-22 LAB — CBC WITH DIFFERENTIAL/PLATELET
Abs Immature Granulocytes: 0.05 10*3/uL (ref 0.00–0.07)
Basophils Absolute: 0 10*3/uL (ref 0.0–0.1)
Basophils Relative: 0 %
Eosinophils Absolute: 0.1 10*3/uL (ref 0.0–0.5)
Eosinophils Relative: 1 %
HCT: 26.9 % — ABNORMAL LOW (ref 39.0–52.0)
Hemoglobin: 9 g/dL — ABNORMAL LOW (ref 13.0–17.0)
Immature Granulocytes: 1 %
Lymphocytes Relative: 10 %
Lymphs Abs: 0.8 10*3/uL (ref 0.7–4.0)
MCH: 37 pg — ABNORMAL HIGH (ref 26.0–34.0)
MCHC: 33.5 g/dL (ref 30.0–36.0)
MCV: 110.7 fL — ABNORMAL HIGH (ref 80.0–100.0)
Monocytes Absolute: 0.5 10*3/uL (ref 0.1–1.0)
Monocytes Relative: 6 %
Neutro Abs: 6.5 10*3/uL (ref 1.7–7.7)
Neutrophils Relative %: 82 %
Platelets: 82 10*3/uL — ABNORMAL LOW (ref 150–400)
RBC: 2.43 MIL/uL — ABNORMAL LOW (ref 4.22–5.81)
RDW: 13.4 % (ref 11.5–15.5)
WBC: 8 10*3/uL (ref 4.0–10.5)
nRBC: 0.3 % — ABNORMAL HIGH (ref 0.0–0.2)

## 2020-04-22 LAB — COMPREHENSIVE METABOLIC PANEL
ALT: 15 U/L (ref 0–44)
AST: 17 U/L (ref 15–41)
Albumin: 3.2 g/dL — ABNORMAL LOW (ref 3.5–5.0)
Alkaline Phosphatase: 20 U/L — ABNORMAL LOW (ref 38–126)
Anion gap: 5 (ref 5–15)
BUN: 24 mg/dL — ABNORMAL HIGH (ref 8–23)
CO2: 25 mmol/L (ref 22–32)
Calcium: 8.2 mg/dL — ABNORMAL LOW (ref 8.9–10.3)
Chloride: 105 mmol/L (ref 98–111)
Creatinine, Ser: 1.72 mg/dL — ABNORMAL HIGH (ref 0.61–1.24)
GFR calc Af Amer: 45 mL/min — ABNORMAL LOW (ref >60)
GFR calc non Af Amer: 39 mL/min — ABNORMAL LOW (ref >60)
Glucose, Bld: 121 mg/dL — ABNORMAL HIGH (ref 70–99)
Potassium: 4.2 mmol/L (ref 3.5–5.1)
Sodium: 135 mmol/L (ref 135–145)
Total Bilirubin: 0.4 mg/dL (ref 0.3–1.2)
Total Protein: 5.4 g/dL — ABNORMAL LOW (ref 6.5–8.1)

## 2020-04-22 LAB — LACTATE DEHYDROGENASE: LDH: 145 U/L (ref 98–192)

## 2020-04-22 MED ORDER — DIPHENHYDRAMINE HCL 25 MG PO CAPS: 50.0000 mg | ORAL_CAPSULE | Freq: Once | ORAL | Status: DC

## 2020-04-22 MED ORDER — DEXAMETHASONE 4 MG PO TABS: 20.0000 mg | ORAL_TABLET | Freq: Once | ORAL | Status: DC

## 2020-04-22 MED ORDER — DIPHENHYDRAMINE HCL 25 MG PO CAPS
ORAL_CAPSULE | ORAL | Status: AC
  Filled 2020-04-22: qty 2

## 2020-04-22 MED ORDER — ACETAMINOPHEN 325 MG PO TABS
ORAL_TABLET | ORAL | Status: AC
  Filled 2020-04-22: qty 2

## 2020-04-22 MED ORDER — ACETAMINOPHEN 325 MG PO TABS: 650.0000 mg | ORAL_TABLET | Freq: Once | ORAL | Status: DC

## 2020-04-22 MED ORDER — DARATUMUMAB-HYALURONIDASE-FIHJ 1800-30000 MG-UT/15ML ~~LOC~~ SOLN
1800.0000 mg | Freq: Once | SUBCUTANEOUS | Status: AC
  Administered 2020-04-22: 1800 mg via SUBCUTANEOUS
  Filled 2020-04-22: qty 15

## 2020-04-22 NOTE — Patient Instructions (Signed)
[facility] Discharge Instructions for Patients Receiving Chemotherapy  Today you received the following chemotherapy agents   To help prevent nausea and vomiting after your treatment, we encourage you to take your nausea medication   If you develop nausea and vomiting that is not controlled by your nausea medication, call the clinic.   BELOW ARE SYMPTOMS THAT SHOULD BE REPORTED IMMEDIATELY:  *FEVER GREATER THAN 100.5 F  *CHILLS WITH OR WITHOUT FEVER  NAUSEA AND VOMITING THAT IS NOT CONTROLLED WITH YOUR NAUSEA MEDICATION  *UNUSUAL SHORTNESS OF BREATH  *UNUSUAL BRUISING OR BLEEDING  TENDERNESS IN MOUTH AND THROAT WITH OR WITHOUT PRESENCE OF ULCERS  *URINARY PROBLEMS  *BOWEL PROBLEMS  UNUSUAL RASH Items with * indicate a potential emergency and should be followed up as soon as possible.  Feel free to call the clinic should you have any questions or concerns. The clinic phone number is [phone].  Please show the CHEMO ALERT CARD at check-in to the Emergency Department and triage nurse.   

## 2020-04-22 NOTE — Progress Notes (Signed)
Patient presents today for treatment. Vital signs are within parameters for treatment. Labs pending. Patient has complaints of dizziness that has increased since his last treatment.  Patient denies any pain today. Myeloma lab work printed off and given to patient per patient's request. BP today 109/59.  Message sent to Dr. Kevan Rosebush LPN to review labs. Reported platelets 82 and creatinine 1.72.   Message received from [facility] LPN/ Dr. Delton Coombes. Proceed with treatment. Labs reviewed.   Treatment given today per MD orders. Tolerated  without adverse affects. Vital signs stable. No complaints at this time. Discharged from clinic ambulatory. F/U with [facility] as scheduled.

## 2020-04-29 ENCOUNTER — Inpatient Hospital Stay (HOSPITAL_BASED_OUTPATIENT_CLINIC_OR_DEPARTMENT_OTHER): Payer: Medicare Other | Admitting: Hematology

## 2020-04-29 ENCOUNTER — Encounter (HOSPITAL_COMMUNITY): Payer: Self-pay | Admitting: Hematology

## 2020-04-29 ENCOUNTER — Other Ambulatory Visit: Payer: Self-pay

## 2020-04-29 ENCOUNTER — Other Ambulatory Visit (HOSPITAL_COMMUNITY): Payer: Self-pay | Admitting: Hematology

## 2020-04-29 ENCOUNTER — Inpatient Hospital Stay (HOSPITAL_COMMUNITY): Payer: Medicare Other | Attending: Hematology

## 2020-04-29 ENCOUNTER — Inpatient Hospital Stay (HOSPITAL_COMMUNITY): Payer: Medicare Other

## 2020-04-29 DIAGNOSIS — Z79899 Other long term (current) drug therapy: Secondary | ICD-10-CM | POA: Insufficient documentation

## 2020-04-29 DIAGNOSIS — I2510 Atherosclerotic heart disease of native coronary artery without angina pectoris: Secondary | ICD-10-CM | POA: Insufficient documentation

## 2020-04-29 DIAGNOSIS — C9000 Multiple myeloma not having achieved remission: Secondary | ICD-10-CM

## 2020-04-29 DIAGNOSIS — Z9221 Personal history of antineoplastic chemotherapy: Secondary | ICD-10-CM | POA: Insufficient documentation

## 2020-04-29 DIAGNOSIS — Z5112 Encounter for antineoplastic immunotherapy: Secondary | ICD-10-CM | POA: Insufficient documentation

## 2020-04-29 DIAGNOSIS — R42 Dizziness and giddiness: Secondary | ICD-10-CM | POA: Diagnosis not present

## 2020-04-29 DIAGNOSIS — F419 Anxiety disorder, unspecified: Secondary | ICD-10-CM | POA: Insufficient documentation

## 2020-04-29 DIAGNOSIS — I700 Atherosclerosis of aorta: Secondary | ICD-10-CM | POA: Diagnosis not present

## 2020-04-29 DIAGNOSIS — Z923 Personal history of irradiation: Secondary | ICD-10-CM | POA: Diagnosis not present

## 2020-04-29 DIAGNOSIS — K219 Gastro-esophageal reflux disease without esophagitis: Secondary | ICD-10-CM | POA: Insufficient documentation

## 2020-04-29 DIAGNOSIS — I10 Essential (primary) hypertension: Secondary | ICD-10-CM | POA: Diagnosis not present

## 2020-04-29 DIAGNOSIS — Z5111 Encounter for antineoplastic chemotherapy: Secondary | ICD-10-CM | POA: Diagnosis not present

## 2020-04-29 DIAGNOSIS — G629 Polyneuropathy, unspecified: Secondary | ICD-10-CM | POA: Diagnosis not present

## 2020-04-29 LAB — CBC WITH DIFFERENTIAL/PLATELET
Abs Immature Granulocytes: 0.01 10*3/uL (ref 0.00–0.07)
Basophils Absolute: 0 10*3/uL (ref 0.0–0.1)
Basophils Relative: 0 %
Eosinophils Absolute: 0.2 10*3/uL (ref 0.0–0.5)
Eosinophils Relative: 3 %
HCT: 28.6 % — ABNORMAL LOW (ref 39.0–52.0)
Hemoglobin: 9.2 g/dL — ABNORMAL LOW (ref 13.0–17.0)
Immature Granulocytes: 0 %
Lymphocytes Relative: 10 %
Lymphs Abs: 0.6 10*3/uL — ABNORMAL LOW (ref 0.7–4.0)
MCH: 35.8 pg — ABNORMAL HIGH (ref 26.0–34.0)
MCHC: 32.2 g/dL (ref 30.0–36.0)
MCV: 111.3 fL — ABNORMAL HIGH (ref 80.0–100.0)
Monocytes Absolute: 0.5 10*3/uL (ref 0.1–1.0)
Monocytes Relative: 8 %
Neutro Abs: 4.9 10*3/uL (ref 1.7–7.7)
Neutrophils Relative %: 79 %
Platelets: 171 10*3/uL (ref 150–400)
RBC: 2.57 MIL/uL — ABNORMAL LOW (ref 4.22–5.81)
RDW: 13.6 % (ref 11.5–15.5)
WBC: 6.2 10*3/uL (ref 4.0–10.5)
nRBC: 0 % (ref 0.0–0.2)

## 2020-04-29 LAB — COMPREHENSIVE METABOLIC PANEL
ALT: 16 U/L (ref 0–44)
AST: 15 U/L (ref 15–41)
Albumin: 3.6 g/dL (ref 3.5–5.0)
Alkaline Phosphatase: 25 U/L — ABNORMAL LOW (ref 38–126)
Anion gap: 9 (ref 5–15)
BUN: 21 mg/dL (ref 8–23)
CO2: 24 mmol/L (ref 22–32)
Calcium: 9.2 mg/dL (ref 8.9–10.3)
Chloride: 104 mmol/L (ref 98–111)
Creatinine, Ser: 1.87 mg/dL — ABNORMAL HIGH (ref 0.61–1.24)
GFR calc Af Amer: 41 mL/min — ABNORMAL LOW (ref >60)
GFR calc non Af Amer: 35 mL/min — ABNORMAL LOW (ref >60)
Glucose, Bld: 114 mg/dL — ABNORMAL HIGH (ref 70–99)
Potassium: 4.3 mmol/L (ref 3.5–5.1)
Sodium: 137 mmol/L (ref 135–145)
Total Bilirubin: 0.6 mg/dL (ref 0.3–1.2)
Total Protein: 5.7 g/dL — ABNORMAL LOW (ref 6.5–8.1)

## 2020-04-29 LAB — MAGNESIUM: Magnesium: 1.9 mg/dL (ref 1.7–2.4)

## 2020-04-29 LAB — LACTATE DEHYDROGENASE: LDH: 153 U/L (ref 98–192)

## 2020-04-29 MED ORDER — DIPHENHYDRAMINE HCL 25 MG PO CAPS: 50.0000 mg | ORAL_CAPSULE | Freq: Once | ORAL | Status: DC

## 2020-04-29 MED ORDER — BORTEZOMIB CHEMO SQ INJECTION 3.5 MG (2.5MG/ML)
1.3000 mg/m2 | Freq: Once | INTRAMUSCULAR | Status: AC
  Administered 2020-04-29: 2.75 mg via SUBCUTANEOUS
  Filled 2020-04-29: qty 1.1

## 2020-04-29 MED ORDER — DARATUMUMAB-HYALURONIDASE-FIHJ 1800-30000 MG-UT/15ML ~~LOC~~ SOLN
1800.0000 mg | Freq: Once | SUBCUTANEOUS | Status: AC
  Administered 2020-04-29: 1800 mg via SUBCUTANEOUS
  Filled 2020-04-29: qty 15

## 2020-04-29 MED ORDER — DEXAMETHASONE 4 MG PO TABS: 20.0000 mg | ORAL_TABLET | Freq: Once | ORAL | Status: DC

## 2020-04-29 MED ORDER — ACETAMINOPHEN 325 MG PO TABS: 650.0000 mg | ORAL_TABLET | Freq: Once | ORAL | Status: DC

## 2020-04-29 MED ORDER — BETAMETHASONE DIPROPIONATE AUG 0.05 % EX CREA: TOPICAL_CREAM | CUTANEOUS | 6 refills | Status: DC | PRN

## 2020-04-29 NOTE — Patient Instructions (Signed)
[facility] Discharge Instructions for Patients Receiving Chemotherapy  Today you received the following chemotherapy agents   To help prevent nausea and vomiting after your treatment, we encourage you to take your nausea medication   If you develop nausea and vomiting that is not controlled by your nausea medication, call the clinic.   BELOW ARE SYMPTOMS THAT SHOULD BE REPORTED IMMEDIATELY:  *FEVER GREATER THAN 100.5 F  *CHILLS WITH OR WITHOUT FEVER  NAUSEA AND VOMITING THAT IS NOT CONTROLLED WITH YOUR NAUSEA MEDICATION  *UNUSUAL SHORTNESS OF BREATH  *UNUSUAL BRUISING OR BLEEDING  TENDERNESS IN MOUTH AND THROAT WITH OR WITHOUT PRESENCE OF ULCERS  *URINARY PROBLEMS  *BOWEL PROBLEMS  UNUSUAL RASH Items with * indicate a potential emergency and should be followed up as soon as possible.  Feel free to call the clinic should you have any questions or concerns. The clinic phone number is [phone].  Please show the CHEMO ALERT CARD at check-in to the Emergency Department and triage nurse.   

## 2020-04-29 NOTE — Progress Notes (Signed)
Message received from [facility] LPN/ Dr. Delton Coombes. Proceed with treatment. Labs reviewed. Patient to start back on XGeva but not today due to insurance guidelines per [facility] LPN. Vital signs within parameters for treatment. Creatine 1.87, HGB 9.2, platelets 171, ANC 4.9. Labs reviewed by MD.   Patient states all premeds were taken at home prior to appointment today. 07:45am patient took 50mg  of Benadryl PO, 650 mg of Tylenol PO, and Decadron 20mg  PO.    Daratumumab- hyaluronidase-fihj 1800mg  sq injection and Velcade given today per MD orders. Tolerated infusion without adverse affects. Vital signs stable. No complaints at this time. Discharged from clinic ambulatory. F/U with [facility] as scheduled.

## 2020-04-29 NOTE — Progress Notes (Signed)
[place],  [id number]   CLINIC:  Medical Oncology/Hematology  PCP:  [name], MD [place] [address] [phone]   REASON FOR VISIT:  Follow-up for Multiple Myeloma  CURRENT THERAPY: Daratumumab, Velcade and dexamethasone.  BRIEF ONCOLOGIC HISTORY:  Oncology History  Breast cancer, male ([place])  12/31/2009 Initial Biopsy   Biopsy of L breast    12/31/2009 Pathology Results   Invasive ductal carcinoma, ER/PR+, HER 2 negative   12/31/2009 Imaging   Ultrasound showing a 2.43 x 1.85 x 3 cm hypoechoic spiculated mass in the 12 o clock L breast retroareolar region   01/01/2010 -  Anti-estrogen oral therapy   Tamoxifen 20 mg daily   01/06/2010 Imaging   Bone scan abnormal uptake in the diaphysis of the R humerus, abnormal in the R third, fifth and sixth ribs, lesion also noted in the sternum.   02/03/2010 Surgery   Rod placement and fixation of R humerus by Dr. [name]   02/05/2010 - 02/18/2010 Radiation Therapy   30Gy in 10 fractions of 3 Gy per fraction to R pathologic fracture   03/11/2010 -  Chemotherapy   Denosumab monthly, now every 3 months. Started at [facility]    06/09/2016 Imaging   Three hypermetabolic osseous lesions in the sternum, left ilium and right ilium, as discussed above, likely represent osseous metastases. At this time, these are not recognizable on the CT images. 2. No extra skeletal metastatic disease identified in the neck, chest, abdomen or pelvis.   10/13/2016 Progression   PET shows various new and enlarging osseous metastatic lesions with no definite extra osseous metastatic disease currently identified.    12/31/2016 Progression   1. Multifocal hypermetabolic osseous metastases throughout the axial and proximal appendicular skeleton, which are increased in size, number and metabolism since 10/13/2016 PET-CT. 2. 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 3. No additional sites of hypermetabolic metastatic disease. 4. Chronic right mastoid sinusitis. 5. Aortic atherosclerosis.  One vessel coronary atherosclerosis.   Multiple myeloma ([place])  02/12/2017 Bone Marrow Biopsy   The marrow was variably cellular with large peritrabecular aggregates of kappa restricted plasma cells (66% by aspirate, 30% by Cd138). Cytogenetics +11.    03/01/2017 - 06/29/2017 Chemotherapy   RVD    05/26/2017 Bone Marrow Biopsy   Performed at [facility]:  Plasma cell myeloma in a 30% cellularmarrow with decreased trilineage hematopoiesis and 42% kappalight chain restricted plasma cells on the aspirate smears andlarge aggregates on the core biopsy.    07/12/2017 - 09/01/2017 Chemotherapy   3 cycles of carfizolmib/cyclophosphamide/dexamethasone     10/08/2017 Bone Marrow Transplant   Autotransplant at [name]   02/28/2020 -  Chemotherapy   The patient had daratumumab-hyaluronidase-fihj (DARZALEX FASPRO) 1800-30000 MG-UT/15ML chemo SQ injection 1,800 mg, 1,800 mg, Subcutaneous,  Once, 4 of 12 cycles Administration: 1,800 mg (02/28/2020), 1,800 mg (03/05/2020), 1,800 mg (03/12/2020), 1,800 mg (03/18/2020), 1,800 mg (03/25/2020), 1,800 mg (04/01/2020), 1,800 mg (04/29/2020), 1,800 mg (04/08/2020), 1,800 mg (04/15/2020), 1,800 mg (04/22/2020) bortezomib SQ (VELCADE) chemo injection 2.75 mg, 1.3 mg/m2 = 2.75 mg, Subcutaneous,  Once, 4 of 8 cycles Administration: 2.75 mg (02/28/2020), 2.75 mg (03/05/2020), 2.75 mg (03/08/2020), 2.75 mg (03/18/2020), 2.75 mg (03/21/2020), 2.75 mg (03/25/2020), 2.75 mg (03/28/2020), 2.75 mg (04/29/2020), 2.75 mg (04/08/2020), 2.75 mg (04/11/2020), 2.75 mg (04/15/2020), 2.75 mg (04/18/2020)  for  chemotherapy treatment.    Multiple myeloma not having achieved remission ([place])  06/29/2017 Initial Diagnosis   Multiple myeloma not having achieved remission ([place])   02/28/2020 -   Chemotherapy   The patient had daratumumab-hyaluronidase-fihj (DARZALEX FASPRO) 1800-30000 MG-UT/15ML chemo SQ injection 1,800 mg, 1,800 mg, Subcutaneous,  Once, 4 of 12 cycles Administration: 1,800 mg (02/28/2020), 1,800 mg (03/05/2020), 1,800 mg (03/12/2020), 1,800 mg (03/18/2020), 1,800 mg (03/25/2020), 1,800 mg (04/01/2020), 1,800 mg (04/29/2020), 1,800 mg (04/08/2020), 1,800 mg (04/15/2020), 1,800 mg (04/22/2020) bortezomib SQ (VELCADE) chemo injection 2.75 mg, 1.3 mg/m2 = 2.75 mg, Subcutaneous,  Once, 4 of 8 cycles Administration: 2.75 mg (02/28/2020), 2.75 mg (03/05/2020), 2.75 mg (03/08/2020), 2.75 mg (03/18/2020), 2.75 mg (03/21/2020), 2.75 mg (03/25/2020), 2.75 mg (03/28/2020), 2.75 mg (04/29/2020), 2.75 mg (04/08/2020), 2.75 mg (04/11/2020), 2.75 mg (04/15/2020), 2.75 mg (04/18/2020)  for chemotherapy treatment.       CANCER STAGING: Cancer Staging Multiple myeloma ([place]) Staging form: Plasma Cell Myeloma and Plasma Cell Disorders, AJCC 8th Edition - Clinical stage from 05/03/2017: Beta-2-microglobulin (mg/L): 3.5, Albumin (g/dL): 3.4, ISS: Stage II, High-risk cytogenetics: Absent, LDH: Not assessed - Signed by Baird Cancer, PA-C on 05/03/2017    INTERVAL HISTORY:  Mr. Husted 74 y.o. male seen for follow-up and toxicity assessment prior to cycle 4 of chemotherapy for multiple myeloma.  After last treatment, he felt weak, nauseous and dizzy for 3 days.  Denies any fevers or night sweats.  Friday he got better.  Numbness in the legs has been stable.  Denies any new onset pains.  Denies any fevers or infections.  REVIEW OF SYSTEMS:  Review of Systems  Respiratory: Positive for cough.   Neurological: Positive for dizziness and numbness.  All other systems reviewed and are negative.    PAST MEDICAL/SURGICAL HISTORY:  Past Medical History:  Diagnosis Date  . Anxiety   . Bone metastases ([place]) 09/10/2016  . Breast cancer ([place]) 2011   Stave IV breast cancer; radiation and tamoxifen  . Breast cancer, male  ([place])    Stave IV breast cancer; radiation and tamoxifen  Overview:  Left breast ca with mets bone  Overview:  METS TO BONE  . GERD (gastroesophageal reflux disease)   . Hypertension   . Macular degeneration   . Multiple myeloma ([place]) 02/18/2017  . Peripheral neuropathy    Past Surgical History:  Procedure Laterality Date  . BACK SURGERY    . CATARACT EXTRACTION W/PHACO Left 02/03/2019   Procedure: CATARACT EXTRACTION PHACO AND INTRAOCULAR LENS PLACEMENT ([place]);  Surgeon: Baruch Goldmann, MD;  Location: [facility];  Service: Ophthalmology;  Laterality: Left;  CDE: 15.89  . HERNIA REPAIR    . PORTACATH PLACEMENT Left 06/10/2018   Procedure: INSERTION PORT-A-CATH;  Surgeon: Virl Cagey, MD;  Location: [facility]S;  Service: General;  Laterality: Left;  . right arm surgery       SOCIAL HISTORY:  Social History   Socioeconomic History  . Marital status: Married    Spouse name: Not on file  . Number of children: 3  . Years of education: Not on file  . Highest education level: Not on file  Occupational History  . Not on file  Tobacco Use  . Smoking status: Never Smoker  . Smokeless tobacco: Former Network engineer and Sexual Activity  . Alcohol use: Yes    Alcohol/week: 24.0 standard drinks    Types: 24 Cans of beer per week  . Drug use: No  . Sexual activity: Not on file  Other Topics Concern  . Not on file  Social History Narrative  . Not on file   Social Determinants of Health   Financial Resource Strain:   . Difficulty of Paying Living Expenses:   Food Insecurity:   . Worried About Charity fundraiser in the Last Year:   . Arboriculturist in the Last Year:   Transportation Needs:   . Film/video editor (Medical):   Marland Kitchen Lack of Transportation (Non-Medical):   Physical Activity:   . Days of Exercise per Week:   . Minutes of Exercise per Session:   Stress:   . Feeling of Stress :   Social Connections:   . Frequency of Communication with Friends and Family:   .  Frequency of Social Gatherings with Friends and Family:   . Attends Religious Services:   . Active Member of Clubs or Organizations:   . Attends Archivist Meetings:   Marland Kitchen Marital Status:   Intimate Partner Violence:   . Fear of Current or Ex-Partner:   . Emotionally Abused:   Marland Kitchen Physically Abused:   . Sexually Abused:     FAMILY HISTORY:  Family History  Problem Relation Age of Onset  . Stroke Mother   . Cancer Maternal Aunt        cancer NOS; died in her 30s  . Lung cancer Maternal Uncle        smoker    CURRENT MEDICATIONS:  Outpatient Encounter Medications as of 04/29/2020  Medication Sig  . acyclovir (ZOVIRAX) 800 MG tablet TAKE 1 TABLET TWICE DAILY  . ALPRAZolam (XANAX) 0.5 MG tablet TAKE 1 TABLET BY MOUTH AT BEDTIME AS NEEDED FOR ANXIETY OR SLEEP  . amLODipine (NORVASC) 5 MG tablet Take 1 tablet (5 mg total) by mouth daily.  Marland Kitchen aspirin EC 81 MG tablet Take 81 mg by mouth daily.  Marland Kitchen augmented betamethasone dipropionate (DIPROLENE-AF) 0.05 % cream Apply topically as needed.  . calcium-vitamin D (OSCAL WITH D) 500-200 MG-UNIT TABS tablet TAKE TWO TABLETS BY MOUTH DAILY  . CARFILZOMIB IV Inject into the vein.  . Cholecalciferol (VITAMIN D) 50 MCG (2000 UT) CAPS Take 4,000 Units by mouth 2 (two) times daily.  Marland Kitchen denosumab (XGEVA) 120 MG/1.7ML SOLN injection Inject 120 mg into the skin every 30 (thirty) days.   Marland Kitchen dexamethasone (DECADRON) 4 MG tablet Take 5 pills before each treatment  . diclofenac Sodium (VOLTAREN) 1 % GEL Use 1 to 2 times per day as directed by doctor  . folic acid (FOLVITE) 1 MG tablet TAKE 1 TABLET BY MOUTH DAILY  . gabapentin (NEURONTIN) 300 MG capsule Take 1 capsule (300 mg total) by mouth 2 (two) times daily.  . hydrALAZINE (APRESOLINE) 50 MG tablet Take 1 tablet (50 mg total) by mouth every 8 (eight) hours. (Patient taking differently: Take 50 mg by mouth daily. )  . loratadine (CLARITIN) 10 MG tablet Take 1 tablet (10 mg total) by mouth daily.  .  metoprolol succinate (TOPROL-XL) 100 MG 24 hr tablet Take 100 mg by mouth daily.   . Multiple Vitamin (MULTIVITAMIN WITH MINERALS) TABS tablet Take 1 tablet by mouth daily.  . ondansetron (ZOFRAN) 8 MG tablet TAKE ONE TABLET BY MOUTH EVERY 8 HOURS AS NEEDED  . pantoprazole (PROTONIX) 40 MG tablet Take 40 mg by mouth every other day.   . polyethylene glycol (MIRALAX / GLYCOLAX) packet Take 17 g by mouth daily as needed for mild constipation.  . prochlorperazine (COMPAZINE) 10 MG tablet  Take 1 tablet (10 mg total) by mouth every 6 (six) hours as needed for nausea or vomiting.  . pseudoephedrine-guaifenesin (MUCINEX D) 60-600 MG 12 hr tablet Take 1 tablet by mouth 2 (two) times daily as needed for congestion.   . tamoxifen (NOLVADEX) 20 MG tablet TAKE 1 TABLET (20 MG TOTAL) BY MOUTH DAILY.  . vitamin B-12 (CYANOCOBALAMIN) 1000 MCG tablet Take 1,000 mcg by mouth daily.  . [DISCONTINUED] ALPRAZolam (XANAX) 0.5 MG tablet TAKE 1 TABLET BY MOUTH AT BEDTIME AS NEEDED FOR ANXIETY OR SLEEP  . [DISCONTINUED] augmented betamethasone dipropionate (DIPROLENE-AF) 0.05 % cream Apply topically as needed.    No facility-administered encounter medications on file as of 04/29/2020.    ALLERGIES:  Allergies  Allergen Reactions  . Cefepime     Suspected severe thrombocytopenia is a result of cefepime induced antigen platelet destruction     PHYSICAL EXAM:  ECOG Performance status: 1  Vitals:   04/29/20 1001  BP: (!) 106/55  Pulse: 73  Resp: 18  Temp: (!) 97.3 F (36.3 C)  SpO2: 97%   Filed Weights   04/29/20 1001  Weight: 217 lb 9.6 oz (98.7 kg)    Physical Exam Constitutional:      Appearance: Normal appearance.  HENT:     Head: Normocephalic.  Eyes:     Extraocular Movements: Extraocular movements intact.     Conjunctiva/sclera: Conjunctivae normal.  Cardiovascular:     Rate and Rhythm: Normal rate and regular rhythm.     Pulses: Normal pulses.     Heart sounds: Normal heart sounds.    Pulmonary:     Effort: Pulmonary effort is normal.     Breath sounds: Normal breath sounds.  Musculoskeletal:        General: Normal range of motion.     Cervical back: Normal range of motion.  Skin:    General: Skin is warm and dry.  Neurological:     General: No focal deficit present.     Mental Status: He is alert and oriented to person, place, and time. Mental status is at baseline.  Psychiatric:        Mood and Affect: Mood normal.        Behavior: Behavior normal.        Thought Content: Thought content normal.        Judgment: Judgment normal.      LABORATORY DATA:  I have reviewed the labs as listed.  CBC    Component Value Date/Time   WBC 6.2 04/29/2020 0925   RBC 2.57 (L) 04/29/2020 0925   HGB 9.2 (L) 04/29/2020 0925   HCT 28.6 (L) 04/29/2020 0925   PLT 171 04/29/2020 0925   MCV 111.3 (H) 04/29/2020 0925   MCH 35.8 (H) 04/29/2020 0925   MCHC 32.2 04/29/2020 0925   RDW 13.6 04/29/2020 0925   LYMPHSABS 0.6 (L) 04/29/2020 0925   MONOABS 0.5 04/29/2020 0925   EOSABS 0.2 04/29/2020 0925   BASOSABS 0.0 04/29/2020 0925   CMP Latest Ref Rng & Units 04/29/2020 04/22/2020 04/15/2020  Glucose 70 - 99 mg/dL 114(H) 121(H) 105(H)  BUN 8 - 23 mg/dL 21 24(H) 26(H)  Creatinine 0.61 - 1.24 mg/dL 1.87(H) 1.72(H) 1.94(H)  Sodium 135 - 145 mmol/L 137 135 135  Potassium 3.5 - 5.1 mmol/L 4.3 4.2 4.5  Chloride 98 - 111 mmol/L 104 105 100  CO2 22 - 32 mmol/L _0 Calcium 8.9 - 10.3 mg/dL 9.2 8.2(L) 9.4  Total Protein 6.5 -  8.1 g/dL 5.7(L) 5.4(L) 5.8(L)  Total Bilirubin 0.3 - 1.2 mg/dL 0.6 0.4 0.5  Alkaline Phos 38 - 126 U/L 25(L) 20(L) 23(L)  AST 15 - 41 U/L _0 ALT 0 - 44 U/L _1 Radiology: I have reviewed scans.  ASSESSMENT & PLAN:   Multiple myeloma ([place]) 1.  Relapsed multiple myeloma: -3 cycles of daratumumab, bortezomib and dexamethasone on 02/28/2020, 03/18/2020 and 04/08/2020. -PET scan on 02/18/2020 showed left supraclavicular lymph node and largely  improved diffuse bone lesions. -Myeloma panel from 04/15/2020 shows M spike 0.3 g.  Free light chain ratio improved to 63.9.  Kappa light chains are 172. -I reviewed his routine labs which are adequate to proceed with his next treatment today.  His daratumumab will be on day 1 every 21 days starting cycle 4 today. -I will see him back in 3 weeks for follow-up.  He has follow-up visit with Dr. Norma Fredrickson on 05/14/2020.  2.  Myeloma bone disease: -We will restart him on denosumab today as it was temporarily held for dental work.  3.  ID prophylaxis: -Continue acyclovir twice daily.  5.  Metastatic left breast cancer to bones: -Continue tamoxifen daily.  5.  Peripheral neuropathy: -Continue gabapentin 300 mg twice daily.  6.  Dizziness: -He reported dizziness which lasted 2 to 3 days after last treatment.  His blood pressure today is 106/55.  I have told him to discontinue Norvasc.  He will continue Toprol-XL 100 mg daily and hydralazine 50 mg nightly.   Orders placed this encounter:  No orders of the defined types were placed in this encounter.     Derek Jack, MD [place] [phone]

## 2020-04-29 NOTE — Patient Instructions (Signed)
[place] at [facility] Discharge Instructions  You were seen today by Dr. Delton Coombes. He went over your recent lab results. He will see you back in 3 weeks for labs and follow up. STOP taking the Amlodipine!  Thank you for choosing [place] at [facility] to provide your oncology and hematology care.  To afford each patient quality time with our provider, please arrive at least 15 minutes before your scheduled appointment time.   If you have a lab appointment with the [facility] please come in thru the  Main Entrance and check in at the main information desk  You need to re-schedule your appointment should you arrive 10 or more minutes late.  We strive to give you quality time with our providers, and arriving late affects you and other patients whose appointments are after yours.  Also, if you no show three or more times for appointments you may be dismissed from the clinic at the providers discretion.     Again, thank you for choosing [facility].  Our hope is that these requests will decrease the amount of time that you wait before being seen by our physicians.       _____________________________________________________________  Should you have questions after your visit to [facility], please contact our office at [phone] between the hours of 8:00 a.m. and 4:30 p.m.  Voicemails left after 4:00 p.m. will not be returned until the following business day.  For prescription refill requests, have your pharmacy contact our office and allow 72 hours.    Cancer Center Support Programs:   > Cancer Support Group  2nd Tuesday of the month 1pm-2pm, Journey Room

## 2020-04-29 NOTE — Assessment & Plan Note (Signed)
1.  Relapsed multiple myeloma: -3 cycles of daratumumab, bortezomib and dexamethasone on 02/28/2020, 03/18/2020 and 04/08/2020. -PET scan on 02/18/2020 showed left supraclavicular lymph node and largely improved diffuse bone lesions. -Myeloma panel from 04/15/2020 shows M spike 0.3 g.  Free light chain ratio improved to 63.9.  Kappa light chains are 172. -I reviewed his routine labs which are adequate to proceed with his next treatment today.  His daratumumab will be on day 1 every 21 days starting cycle 4 today. -I will see him back in 3 weeks for follow-up.  He has follow-up visit with Dr. Norma Fredrickson on 05/14/2020.  2.  Myeloma bone disease: -We will restart him on denosumab today as it was temporarily held for dental work.  3.  ID prophylaxis: -Continue acyclovir twice daily.  5.  Metastatic left breast cancer to bones: -Continue tamoxifen daily.  5.  Peripheral neuropathy: -Continue gabapentin 300 mg twice daily.  6.  Dizziness: -He reported dizziness which lasted 2 to 3 days after last treatment.  His blood pressure today is 106/55.  I have told him to discontinue Norvasc.  He will continue Toprol-XL 100 mg daily and hydralazine 50 mg nightly.

## 2020-04-29 NOTE — Progress Notes (Signed)
Patient has been assessed, vital signs and labs have been reviewed by Dr. Delton Coombes. ANC, Creatinine, LFTs, and Platelets are within treatment parameters per Dr. Delton Coombes. The patient is good to proceed with treatment at this time. Will restart Xgeva per Dr. Delton Coombes we will schedule.

## 2020-04-30 LAB — PROTEIN ELECTROPHORESIS, SERUM
A/G Ratio: 1.6 (ref 0.7–1.7)
Albumin ELP: 3.3 g/dL (ref 2.9–4.4)
Alpha-1-Globulin: 0.2 g/dL (ref 0.0–0.4)
Alpha-2-Globulin: 0.6 g/dL (ref 0.4–1.0)
Beta Globulin: 0.8 g/dL (ref 0.7–1.3)
Gamma Globulin: 0.5 g/dL (ref 0.4–1.8)
Globulin, Total: 2.1 g/dL — ABNORMAL LOW (ref 2.2–3.9)
M-Spike, %: 0.2 g/dL — ABNORMAL HIGH
Total Protein ELP: 5.4 g/dL — ABNORMAL LOW (ref 6.0–8.5)

## 2020-04-30 LAB — KAPPA/LAMBDA LIGHT CHAINS
Kappa free light chain: 216.8 mg/L — ABNORMAL HIGH (ref 3.3–19.4)
Kappa, lambda light chain ratio: 135.5 — ABNORMAL HIGH (ref 0.26–1.65)
Lambda free light chains: 1.6 mg/L — ABNORMAL LOW (ref 5.7–26.3)

## 2020-05-02 ENCOUNTER — Inpatient Hospital Stay (HOSPITAL_COMMUNITY): Payer: Medicare Other

## 2020-05-02 ENCOUNTER — Other Ambulatory Visit: Payer: Self-pay

## 2020-05-02 VITALS — BP 121/63 | HR 70 | Temp 96.9°F | Resp 18

## 2020-05-02 DIAGNOSIS — C9000 Multiple myeloma not having achieved remission: Secondary | ICD-10-CM

## 2020-05-02 DIAGNOSIS — Z5112 Encounter for antineoplastic immunotherapy: Secondary | ICD-10-CM | POA: Diagnosis not present

## 2020-05-02 DIAGNOSIS — I700 Atherosclerosis of aorta: Secondary | ICD-10-CM | POA: Diagnosis not present

## 2020-05-02 DIAGNOSIS — R42 Dizziness and giddiness: Secondary | ICD-10-CM | POA: Diagnosis not present

## 2020-05-02 DIAGNOSIS — Z5111 Encounter for antineoplastic chemotherapy: Secondary | ICD-10-CM | POA: Diagnosis not present

## 2020-05-02 DIAGNOSIS — I2510 Atherosclerotic heart disease of native coronary artery without angina pectoris: Secondary | ICD-10-CM | POA: Diagnosis not present

## 2020-05-02 MED ORDER — BORTEZOMIB CHEMO SQ INJECTION 3.5 MG (2.5MG/ML)
1.3000 mg/m2 | Freq: Once | INTRAMUSCULAR | Status: AC
  Administered 2020-05-02: 2.75 mg via SUBCUTANEOUS
  Filled 2020-05-02: qty 1.1

## 2020-05-02 NOTE — Patient Instructions (Signed)
[facility] Discharge Instructions for Patients Receiving Chemotherapy  Today you received the following chemotherapy agents   To help prevent nausea and vomiting after your treatment, we encourage you to take your nausea medication   If you develop nausea and vomiting that is not controlled by your nausea medication, call the clinic.   BELOW ARE SYMPTOMS THAT SHOULD BE REPORTED IMMEDIATELY:  *FEVER GREATER THAN 100.5 F  *CHILLS WITH OR WITHOUT FEVER  NAUSEA AND VOMITING THAT IS NOT CONTROLLED WITH YOUR NAUSEA MEDICATION  *UNUSUAL SHORTNESS OF BREATH  *UNUSUAL BRUISING OR BLEEDING  TENDERNESS IN MOUTH AND THROAT WITH OR WITHOUT PRESENCE OF ULCERS  *URINARY PROBLEMS  *BOWEL PROBLEMS  UNUSUAL RASH Items with * indicate a potential emergency and should be followed up as soon as possible.  Feel free to call the clinic should you have any questions or concerns. The clinic phone number is [phone].  Please show the CHEMO ALERT CARD at check-in to the Emergency Department and triage nurse.   

## 2020-05-02 NOTE — Progress Notes (Signed)
Labs reviewed on 04-29-2020. Proceed with Velcade today per parameters. Creatinine noted 1.87.  Treatment given per orders. Patient tolerated it well without problems. Vitals stable and discharged home from clinic ambulatory. Follow up as scheduled.

## 2020-05-03 ENCOUNTER — Inpatient Hospital Stay (HOSPITAL_COMMUNITY): Payer: Medicare Other

## 2020-05-03 VITALS — BP 138/70 | HR 68 | Temp 96.9°F | Resp 18 | Wt 218.6 lb

## 2020-05-03 DIAGNOSIS — Z5112 Encounter for antineoplastic immunotherapy: Secondary | ICD-10-CM | POA: Diagnosis not present

## 2020-05-03 DIAGNOSIS — I700 Atherosclerosis of aorta: Secondary | ICD-10-CM | POA: Diagnosis not present

## 2020-05-03 DIAGNOSIS — I2510 Atherosclerotic heart disease of native coronary artery without angina pectoris: Secondary | ICD-10-CM | POA: Diagnosis not present

## 2020-05-03 DIAGNOSIS — Z5111 Encounter for antineoplastic chemotherapy: Secondary | ICD-10-CM | POA: Diagnosis not present

## 2020-05-03 DIAGNOSIS — C9000 Multiple myeloma not having achieved remission: Secondary | ICD-10-CM

## 2020-05-03 DIAGNOSIS — R42 Dizziness and giddiness: Secondary | ICD-10-CM | POA: Diagnosis not present

## 2020-05-03 DIAGNOSIS — C7951 Secondary malignant neoplasm of bone: Secondary | ICD-10-CM

## 2020-05-03 MED ORDER — DENOSUMAB 120 MG/1.7ML ~~LOC~~ SOLN
120.0000 mg | Freq: Once | SUBCUTANEOUS | Status: AC
  Administered 2020-05-03: 120 mg via SUBCUTANEOUS
  Filled 2020-05-03: qty 1.7

## 2020-05-03 NOTE — Progress Notes (Signed)
Patient tolerated Xgeva injection with no complaints voiced.  Site clean and dry with no bruising or swelling noted.  No complaints of pain.  Discharged with vital signs stable and no signs or symptoms of distress noted.  

## 2020-05-06 ENCOUNTER — Encounter (HOSPITAL_COMMUNITY): Payer: Self-pay

## 2020-05-06 ENCOUNTER — Inpatient Hospital Stay (HOSPITAL_COMMUNITY): Payer: Medicare Other

## 2020-05-06 ENCOUNTER — Other Ambulatory Visit: Payer: Self-pay

## 2020-05-06 VITALS — BP 110/64 | HR 64 | Temp 96.9°F | Resp 18 | Wt 214.4 lb

## 2020-05-06 DIAGNOSIS — C9000 Multiple myeloma not having achieved remission: Secondary | ICD-10-CM | POA: Diagnosis not present

## 2020-05-06 DIAGNOSIS — C7951 Secondary malignant neoplasm of bone: Secondary | ICD-10-CM

## 2020-05-06 DIAGNOSIS — Z5112 Encounter for antineoplastic immunotherapy: Secondary | ICD-10-CM | POA: Diagnosis not present

## 2020-05-06 DIAGNOSIS — R42 Dizziness and giddiness: Secondary | ICD-10-CM | POA: Diagnosis not present

## 2020-05-06 DIAGNOSIS — I700 Atherosclerosis of aorta: Secondary | ICD-10-CM | POA: Diagnosis not present

## 2020-05-06 DIAGNOSIS — Z5111 Encounter for antineoplastic chemotherapy: Secondary | ICD-10-CM | POA: Diagnosis not present

## 2020-05-06 DIAGNOSIS — N179 Acute kidney failure, unspecified: Secondary | ICD-10-CM

## 2020-05-06 DIAGNOSIS — I2510 Atherosclerotic heart disease of native coronary artery without angina pectoris: Secondary | ICD-10-CM | POA: Diagnosis not present

## 2020-05-06 LAB — COMPREHENSIVE METABOLIC PANEL
ALT: 15 U/L (ref 0–44)
AST: 17 U/L (ref 15–41)
Albumin: 3.6 g/dL (ref 3.5–5.0)
Alkaline Phosphatase: 20 U/L — ABNORMAL LOW (ref 38–126)
Anion gap: 7 (ref 5–15)
BUN: 22 mg/dL (ref 8–23)
CO2: 26 mmol/L (ref 22–32)
Calcium: 8.8 mg/dL — ABNORMAL LOW (ref 8.9–10.3)
Chloride: 103 mmol/L (ref 98–111)
Creatinine, Ser: 1.86 mg/dL — ABNORMAL HIGH (ref 0.61–1.24)
GFR calc Af Amer: 41 mL/min — ABNORMAL LOW (ref >60)
GFR calc non Af Amer: 35 mL/min — ABNORMAL LOW (ref >60)
Glucose, Bld: 113 mg/dL — ABNORMAL HIGH (ref 70–99)
Potassium: 4.1 mmol/L (ref 3.5–5.1)
Sodium: 136 mmol/L (ref 135–145)
Total Bilirubin: 0.6 mg/dL (ref 0.3–1.2)
Total Protein: 5.7 g/dL — ABNORMAL LOW (ref 6.5–8.1)

## 2020-05-06 LAB — CBC WITH DIFFERENTIAL/PLATELET
Abs Immature Granulocytes: 0.02 10*3/uL (ref 0.00–0.07)
Basophils Absolute: 0 10*3/uL (ref 0.0–0.1)
Basophils Relative: 0 %
Eosinophils Absolute: 0.1 10*3/uL (ref 0.0–0.5)
Eosinophils Relative: 3 %
HCT: 29.1 % — ABNORMAL LOW (ref 39.0–52.0)
Hemoglobin: 9.6 g/dL — ABNORMAL LOW (ref 13.0–17.0)
Immature Granulocytes: 0 %
Lymphocytes Relative: 15 %
Lymphs Abs: 0.7 10*3/uL (ref 0.7–4.0)
MCH: 35.8 pg — ABNORMAL HIGH (ref 26.0–34.0)
MCHC: 33 g/dL (ref 30.0–36.0)
MCV: 108.6 fL — ABNORMAL HIGH (ref 80.0–100.0)
Monocytes Absolute: 0.5 10*3/uL (ref 0.1–1.0)
Monocytes Relative: 11 %
Neutro Abs: 3.2 10*3/uL (ref 1.7–7.7)
Neutrophils Relative %: 71 %
Platelets: 133 10*3/uL — ABNORMAL LOW (ref 150–400)
RBC: 2.68 MIL/uL — ABNORMAL LOW (ref 4.22–5.81)
RDW: 13.6 % (ref 11.5–15.5)
WBC: 4.6 10*3/uL (ref 4.0–10.5)
nRBC: 0 % (ref 0.0–0.2)

## 2020-05-06 LAB — MAGNESIUM: Magnesium: 2 mg/dL (ref 1.7–2.4)

## 2020-05-06 MED ORDER — EPOETIN ALFA-EPBX 10000 UNIT/ML IJ SOLN
20000.0000 [IU] | Freq: Once | INTRAMUSCULAR | Status: AC
  Administered 2020-05-06: 20000 [IU] via SUBCUTANEOUS
  Filled 2020-05-06: qty 2

## 2020-05-06 MED ORDER — BORTEZOMIB CHEMO SQ INJECTION 3.5 MG (2.5MG/ML)
1.3000 mg/m2 | Freq: Once | INTRAMUSCULAR | Status: AC
  Administered 2020-05-06: 2.75 mg via SUBCUTANEOUS
  Filled 2020-05-06: qty 1.1

## 2020-05-06 NOTE — Progress Notes (Signed)
Serum Creatinine 1.86 today.  Ok to treat today verbal order Dr. Delton Coombes.   Patient tolerated Velcade injection with no complaints voiced.  Lab work reviewed.  See MAR for details.  Injection site clean and dry with no bruising or swelling noted.  Band aid applied.  VSS.    Patient presents today for Retacrit injection. Hemoglobin reviewed prior to administration. VSS. Injection tolerated without incident or complaint. See MAR for details. Patient discharged in satisfactory condition with follow up instructions.

## 2020-05-09 ENCOUNTER — Other Ambulatory Visit: Payer: Self-pay

## 2020-05-09 ENCOUNTER — Inpatient Hospital Stay (HOSPITAL_COMMUNITY): Payer: Medicare Other

## 2020-05-09 ENCOUNTER — Encounter (HOSPITAL_COMMUNITY): Payer: Self-pay

## 2020-05-09 VITALS — BP 106/54 | HR 68 | Temp 96.6°F | Resp 18

## 2020-05-09 DIAGNOSIS — C9000 Multiple myeloma not having achieved remission: Secondary | ICD-10-CM | POA: Diagnosis not present

## 2020-05-09 DIAGNOSIS — Z5112 Encounter for antineoplastic immunotherapy: Secondary | ICD-10-CM | POA: Diagnosis not present

## 2020-05-09 DIAGNOSIS — R42 Dizziness and giddiness: Secondary | ICD-10-CM | POA: Diagnosis not present

## 2020-05-09 DIAGNOSIS — Z5111 Encounter for antineoplastic chemotherapy: Secondary | ICD-10-CM | POA: Diagnosis not present

## 2020-05-09 DIAGNOSIS — I700 Atherosclerosis of aorta: Secondary | ICD-10-CM | POA: Diagnosis not present

## 2020-05-09 DIAGNOSIS — I2510 Atherosclerotic heart disease of native coronary artery without angina pectoris: Secondary | ICD-10-CM | POA: Diagnosis not present

## 2020-05-09 MED ORDER — BORTEZOMIB CHEMO SQ INJECTION 3.5 MG (2.5MG/ML)
1.3000 mg/m2 | Freq: Once | INTRAMUSCULAR | Status: AC
  Administered 2020-05-09: 2.75 mg via SUBCUTANEOUS
  Filled 2020-05-09: qty 1.1

## 2020-05-09 NOTE — Progress Notes (Signed)
Johnny Lucas tolerated Velcade injection well without complaints or incident. VSS Pt discharged self ambulatory in satisfactory condition

## 2020-05-09 NOTE — Patient Instructions (Signed)
[facility] Discharge Instructions for Patients Receiving Chemotherapy   Beginning January 23rd 2017 lab work for the Cancer Center will be done in the  Main lab at [place] on 1st floor. If you have a lab appointment with the Cancer Center please come in thru the  Main Entrance and check in at the main information desk   Today you received the following chemotherapy agents Velcade injection. Follow-up as scheduled. Call clinic for any questions or concerns  To help prevent nausea and vomiting after your treatment, we encourage you to take your nausea medication   If you develop nausea and vomiting, or diarrhea that is not controlled by your medication, call the clinic.  The clinic phone number is [phone]. Office hours are Monday-Friday 8:30am-5:00pm.  BELOW ARE SYMPTOMS THAT SHOULD BE REPORTED IMMEDIATELY:  *FEVER GREATER THAN 101.0 F  *CHILLS WITH OR WITHOUT FEVER  NAUSEA AND VOMITING THAT IS NOT CONTROLLED WITH YOUR NAUSEA MEDICATION  *UNUSUAL SHORTNESS OF BREATH  *UNUSUAL BRUISING OR BLEEDING  TENDERNESS IN MOUTH AND THROAT WITH OR WITHOUT PRESENCE OF ULCERS  *URINARY PROBLEMS  *BOWEL PROBLEMS  UNUSUAL RASH Items with * indicate a potential emergency and should be followed up as soon as possible. If you have an emergency after office hours please contact your primary care physician or go to the nearest emergency department.  Please call the clinic during office hours if you have any questions or concerns.   You may also contact the Patient Navigator at [phone] should you have any questions or need assistance in obtaining follow up care.      Resources For Cancer Patients and their Caregivers ? American Cancer Society: Can assist with transportation, wigs, general needs, runs Look Good Feel Better.        1-888-227-6333 ? Cancer Care: Provides financial assistance, online support groups, medication/co-pay assistance.   1-800-813-HOPE (4673) ? [facility] Assists [place] cancer patients and their families through emotional , educational and financial support.  336-427-4357 ? [place] DSS Where to apply for food stamps, Medicaid and utility assistance. 336-342-1394 ? [facility]: Transportation to medical appointments. 336-347-2287 ? Social Security Administration: May apply for disability if have a Stage IV cancer. 336-342-7796 1-800-772-1213 ? [place] [employer], Disability and Transit Services: Assists with nutrition, care and transit needs. 336-349-2343         

## 2020-05-13 ENCOUNTER — Ambulatory Visit (HOSPITAL_COMMUNITY): Payer: Medicare Other

## 2020-05-13 ENCOUNTER — Other Ambulatory Visit (HOSPITAL_COMMUNITY): Payer: Medicare Other

## 2020-05-14 DIAGNOSIS — R52 Pain, unspecified: Secondary | ICD-10-CM | POA: Diagnosis not present

## 2020-05-14 DIAGNOSIS — C9002 Multiple myeloma in relapse: Secondary | ICD-10-CM | POA: Diagnosis not present

## 2020-05-14 DIAGNOSIS — D801 Nonfamilial hypogammaglobulinemia: Secondary | ICD-10-CM | POA: Diagnosis not present

## 2020-05-14 DIAGNOSIS — C9000 Multiple myeloma not having achieved remission: Secondary | ICD-10-CM | POA: Diagnosis not present

## 2020-05-14 DIAGNOSIS — Z79899 Other long term (current) drug therapy: Secondary | ICD-10-CM | POA: Diagnosis not present

## 2020-05-14 DIAGNOSIS — Z8579 Personal history of other malignant neoplasms of lymphoid, hematopoietic and related tissues: Secondary | ICD-10-CM | POA: Diagnosis not present

## 2020-05-14 DIAGNOSIS — Z7983 Long term (current) use of bisphosphonates: Secondary | ICD-10-CM | POA: Diagnosis not present

## 2020-05-14 DIAGNOSIS — Z853 Personal history of malignant neoplasm of breast: Secondary | ICD-10-CM | POA: Diagnosis not present

## 2020-05-14 DIAGNOSIS — D631 Anemia in chronic kidney disease: Secondary | ICD-10-CM | POA: Diagnosis not present

## 2020-05-14 DIAGNOSIS — N189 Chronic kidney disease, unspecified: Secondary | ICD-10-CM | POA: Diagnosis not present

## 2020-05-14 DIAGNOSIS — G629 Polyneuropathy, unspecified: Secondary | ICD-10-CM | POA: Diagnosis not present

## 2020-05-16 ENCOUNTER — Other Ambulatory Visit (HOSPITAL_COMMUNITY): Payer: Self-pay | Admitting: Hematology

## 2020-05-16 MED ORDER — POMALIDOMIDE 3 MG PO CAPS
3.0000 mg | ORAL_CAPSULE | Freq: Every day | ORAL | 0 refills | Status: DC
Start: 2020-05-16 — End: 2020-06-11

## 2020-05-17 NOTE — Progress Notes (Signed)

## 2020-05-20 ENCOUNTER — Other Ambulatory Visit: Payer: Self-pay

## 2020-05-20 ENCOUNTER — Inpatient Hospital Stay (HOSPITAL_BASED_OUTPATIENT_CLINIC_OR_DEPARTMENT_OTHER): Payer: Medicare Other | Admitting: Hematology

## 2020-05-20 ENCOUNTER — Inpatient Hospital Stay (HOSPITAL_COMMUNITY): Payer: Medicare Other

## 2020-05-20 VITALS — BP 122/64 | HR 65 | Temp 97.3°F | Resp 18 | Wt 212.3 lb

## 2020-05-20 DIAGNOSIS — Z5112 Encounter for antineoplastic immunotherapy: Secondary | ICD-10-CM | POA: Diagnosis not present

## 2020-05-20 DIAGNOSIS — I700 Atherosclerosis of aorta: Secondary | ICD-10-CM | POA: Diagnosis not present

## 2020-05-20 DIAGNOSIS — I2510 Atherosclerotic heart disease of native coronary artery without angina pectoris: Secondary | ICD-10-CM | POA: Diagnosis not present

## 2020-05-20 DIAGNOSIS — R42 Dizziness and giddiness: Secondary | ICD-10-CM | POA: Diagnosis not present

## 2020-05-20 DIAGNOSIS — C9000 Multiple myeloma not having achieved remission: Secondary | ICD-10-CM | POA: Diagnosis not present

## 2020-05-20 DIAGNOSIS — Z5111 Encounter for antineoplastic chemotherapy: Secondary | ICD-10-CM | POA: Diagnosis not present

## 2020-05-20 LAB — CBC WITH DIFFERENTIAL/PLATELET
Abs Immature Granulocytes: 0.01 10*3/uL (ref 0.00–0.07)
Basophils Absolute: 0 10*3/uL (ref 0.0–0.1)
Basophils Relative: 1 %
Eosinophils Absolute: 0.2 10*3/uL (ref 0.0–0.5)
Eosinophils Relative: 3 %
HCT: 31.2 % — ABNORMAL LOW (ref 39.0–52.0)
Hemoglobin: 10 g/dL — ABNORMAL LOW (ref 13.0–17.0)
Immature Granulocytes: 0 %
Lymphocytes Relative: 14 %
Lymphs Abs: 0.7 10*3/uL (ref 0.7–4.0)
MCH: 35.2 pg — ABNORMAL HIGH (ref 26.0–34.0)
MCHC: 32.1 g/dL (ref 30.0–36.0)
MCV: 109.9 fL — ABNORMAL HIGH (ref 80.0–100.0)
Monocytes Absolute: 0.6 10*3/uL (ref 0.1–1.0)
Monocytes Relative: 12 %
Neutro Abs: 3.3 10*3/uL (ref 1.7–7.7)
Neutrophils Relative %: 70 %
Platelets: 215 10*3/uL (ref 150–400)
RBC: 2.84 MIL/uL — ABNORMAL LOW (ref 4.22–5.81)
RDW: 14.3 % (ref 11.5–15.5)
WBC: 4.7 10*3/uL (ref 4.0–10.5)
nRBC: 0 % (ref 0.0–0.2)

## 2020-05-20 LAB — COMPREHENSIVE METABOLIC PANEL
ALT: 16 U/L (ref 0–44)
AST: 16 U/L (ref 15–41)
Albumin: 3.7 g/dL (ref 3.5–5.0)
Alkaline Phosphatase: 23 U/L — ABNORMAL LOW (ref 38–126)
Anion gap: 7 (ref 5–15)
BUN: 17 mg/dL (ref 8–23)
CO2: 26 mmol/L (ref 22–32)
Calcium: 8.7 mg/dL — ABNORMAL LOW (ref 8.9–10.3)
Chloride: 105 mmol/L (ref 98–111)
Creatinine, Ser: 1.61 mg/dL — ABNORMAL HIGH (ref 0.61–1.24)
GFR calc Af Amer: 49 mL/min — ABNORMAL LOW (ref >60)
GFR calc non Af Amer: 42 mL/min — ABNORMAL LOW (ref >60)
Glucose, Bld: 105 mg/dL — ABNORMAL HIGH (ref 70–99)
Potassium: 4.2 mmol/L (ref 3.5–5.1)
Sodium: 138 mmol/L (ref 135–145)
Total Bilirubin: 0.6 mg/dL (ref 0.3–1.2)
Total Protein: 5.9 g/dL — ABNORMAL LOW (ref 6.5–8.1)

## 2020-05-20 NOTE — Patient Instructions (Addendum)
[place] at [facility] Discharge Instructions  You were seen today by Dr. Delton Coombes. He went over your recent lab results. Start taking the Dexamethasone 5 tablets once a week on Mondays. Start taking the pomalyst every night at bedtime. You will start getting Darzalex every 2 weeks. You will be stopping the velcade injections. He will see you back in 2 weeks for labs, treatment and follow up.   Thank you for choosing [place] at [facility] to provide your oncology and hematology care.  To afford each patient quality time with our provider, please arrive at least 15 minutes before your scheduled appointment time.   If you have a lab appointment with the [facility] please come in thru the  Main Entrance and check in at the main information desk  You need to re-schedule your appointment should you arrive 10 or more minutes late.  We strive to give you quality time with our providers, and arriving late affects you and other patients whose appointments are after yours.  Also, if you no show three or more times for appointments you may be dismissed from the clinic at the providers discretion.     Again, thank you for choosing [facility].  Our hope is that these requests will decrease the amount of time that you wait before being seen by our physicians.       _____________________________________________________________  Should you have questions after your visit to [facility], please contact our office at [phone] between the hours of 8:00 a.m. and 4:30 p.m.  Voicemails left after 4:00 p.m. will not be returned until the following business day.  For prescription refill requests, have your pharmacy contact our office and allow 72 hours.    Cancer Center Support Programs:   > Cancer Support Group  2nd Tuesday of the month 1pm-2pm, Journey Room

## 2020-05-20 NOTE — Patient Instructions (Signed)
[facility] Discharge Instructions for Patients Receiving Chemotherapy   Beginning January 23rd 2017 lab work for the [facility] will be done in the  Main lab at [facility] on 1st floor. If you have a lab appointment with the [facility] please come in thru the  Main Entrance and check in at the main information desk   Today you received the following chemotherapy agents Daratumumab and Velcade  To help prevent nausea and vomiting after your treatment, we encourage you to take your nausea medication If you develop nausea and vomiting, or diarrhea that is not controlled by your medication, call the clinic.  The clinic phone number is [phone]. Office hours are Monday-Friday 8:30am-5:00pm.  BELOW ARE SYMPTOMS THAT SHOULD BE REPORTED IMMEDIATELY:  *FEVER GREATER THAN 101.0 F  *CHILLS WITH OR WITHOUT FEVER  NAUSEA AND VOMITING THAT IS NOT CONTROLLED WITH YOUR NAUSEA MEDICATION  *UNUSUAL SHORTNESS OF BREATH  *UNUSUAL BRUISING OR BLEEDING  TENDERNESS IN MOUTH AND THROAT WITH OR WITHOUT PRESENCE OF ULCERS  *URINARY PROBLEMS  *BOWEL PROBLEMS  UNUSUAL RASH Items with * indicate a potential emergency and should be followed up as soon as possible. If you have an emergency after office hours please contact your primary care physician or go to the nearest emergency department.  Please call the clinic during office hours if you have any questions or concerns.   You may also contact the Patient Navigator at [phone] should you have any questions or need assistance in obtaining follow up care.      Resources For Cancer Patients and their Caregivers ? American Cancer Society: Can assist with transportation, wigs, general needs, runs Look Good Feel Better.        262-445-5218 ? Cancer Care: Provides financial assistance, online support groups, medication/co-pay assistance.  1-800-813-HOPE (726)232-1497) ? [facility] Assists  [place] cancer patients and their families through emotional , educational and financial support.  203-706-0504 ? [place] DSS Where to apply for food stamps, Medicaid and utility assistance. (270)027-1043 ? [facility]: Transportation to medical appointments. 224-150-8716 ? Social Security Administration: May apply for disability if have a Stage IV cancer. 931-150-4213 9024138565 ? [employer], Disability and Transit Services: Assists with nutrition, care and transit needs. (343) 072-0379

## 2020-05-20 NOTE — Progress Notes (Signed)
[place], [place] [address]   CLINIC:  Medical Oncology/Hematology  PCP:  [name], MD [place] [address] [phone]   REASON FOR VISIT:  Follow-up for Multiple Myeloma  CURRENT THERAPY: Daratumumab, pomalidomide and dexamethasone.  BRIEF ONCOLOGIC HISTORY:  Oncology History  Breast cancer, male ([place])  12/31/2009 Initial Biopsy   Biopsy of L breast    12/31/2009 Pathology Results   Invasive ductal carcinoma, ER/PR+, HER 2 negative   12/31/2009 Imaging   Ultrasound showing a 2.43 x 1.85 x 3 cm hypoechoic spiculated mass in the 12 o clock L breast retroareolar region   01/01/2010 -  Anti-estrogen oral therapy   Tamoxifen 20 mg daily   01/06/2010 Imaging   Bone scan abnormal uptake in the diaphysis of the R humerus, abnormal in the R third, fifth and sixth ribs, lesion also noted in the sternum.   02/03/2010 Surgery   Rod placement and fixation of R humerus by Dr. [name]   02/05/2010 - 02/18/2010 Radiation Therapy   30Gy in 10 fractions of 3 Gy per fraction to R pathologic fracture   03/11/2010 -  Chemotherapy   Denosumab monthly, now every 3 months. Started at [facility]    06/09/2016 Imaging   Three hypermetabolic osseous lesions in the sternum, left ilium and right ilium, as discussed above, likely represent osseous metastases. At this time, these are not recognizable on the CT images. 2. No extra skeletal metastatic disease identified in the neck, chest, abdomen or pelvis.   10/13/2016 Progression   PET shows various new and enlarging osseous metastatic lesions with no definite extra osseous metastatic disease currently identified.    12/31/2016 Progression   1. Multifocal hypermetabolic osseous metastases throughout the axial and proximal appendicular skeleton, which are increased in size, number and metabolism since 10/13/2016 PET-CT. 2. 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 3. No additional sites of hypermetabolic metastatic disease. 4. Chronic right mastoid sinusitis. 5. Aortic atherosclerosis.  One vessel coronary atherosclerosis.   Multiple myeloma ([place])  02/12/2017 Bone Marrow Biopsy   The marrow was variably cellular with large peritrabecular aggregates of kappa restricted plasma cells (66% by aspirate, 30% by Cd138). Cytogenetics +11.    03/01/2017 - 06/29/2017 Chemotherapy   RVD    05/26/2017 Bone Marrow Biopsy   Performed at [facility]:  Plasma cell myeloma in a 30% cellularmarrow with decreased trilineage hematopoiesis and 42% kappalight chain restricted plasma cells on the aspirate smears andlarge aggregates on the core biopsy.    07/12/2017 - 09/01/2017 Chemotherapy   3 cycles of carfizolmib/cyclophosphamide/dexamethasone     10/08/2017 Bone Marrow Transplant   Autotransplant at [facility]   02/28/2020 -  Chemotherapy   The patient had daratumumab-hyaluronidase-fihj (DARZALEX FASPRO) 1800-30000 MG-UT/15ML chemo SQ injection 1,800 mg, 1,800 mg, Subcutaneous,  Once, 4 of 11 cycles Administration: 1,800 mg (02/28/2020), 1,800 mg (03/05/2020), 1,800 mg (03/12/2020), 1,800 mg (03/18/2020), 1,800 mg (03/25/2020), 1,800 mg (04/01/2020), 1,800 mg (04/29/2020), 1,800 mg (04/08/2020), 1,800 mg (04/15/2020), 1,800 mg (04/22/2020) bortezomib SQ (VELCADE) chemo injection 2.75 mg, 1.3 mg/m2 = 2.75 mg, Subcutaneous,  Once, 4 of 4 cycles Administration: 2.75 mg (02/28/2020), 2.75 mg (03/05/2020), 2.75 mg (03/08/2020), 2.75 mg (03/18/2020), 2.75 mg (03/21/2020), 2.75 mg (03/25/2020), 2.75 mg (03/28/2020), 2.75 mg (04/29/2020), 2.75 mg (05/02/2020), 2.75 mg (05/06/2020), 2.75 mg (05/09/2020), 2.75 mg (04/08/2020), 2.75 mg (  04/11/2020), 2.75 mg (04/15/2020), 2.75 mg (04/18/2020)  for chemotherapy treatment.    Multiple myeloma not having achieved remission ([name])  06/29/2017 Initial Diagnosis    Multiple myeloma not having achieved remission ([place])   02/28/2020 -  Chemotherapy   The patient had daratumumab-hyaluronidase-fihj (DARZALEX FASPRO) 1800-30000 MG-UT/15ML chemo SQ injection 1,800 mg, 1,800 mg, Subcutaneous,  Once, 4 of 11 cycles Administration: 1,800 mg (02/28/2020), 1,800 mg (03/05/2020), 1,800 mg (03/12/2020), 1,800 mg (03/18/2020), 1,800 mg (03/25/2020), 1,800 mg (04/01/2020), 1,800 mg (04/29/2020), 1,800 mg (04/08/2020), 1,800 mg (04/15/2020), 1,800 mg (04/22/2020) bortezomib SQ (VELCADE) chemo injection 2.75 mg, 1.3 mg/m2 = 2.75 mg, Subcutaneous,  Once, 4 of 4 cycles Administration: 2.75 mg (02/28/2020), 2.75 mg (03/05/2020), 2.75 mg (03/08/2020), 2.75 mg (03/18/2020), 2.75 mg (03/21/2020), 2.75 mg (03/25/2020), 2.75 mg (03/28/2020), 2.75 mg (04/29/2020), 2.75 mg (05/02/2020), 2.75 mg (05/06/2020), 2.75 mg (05/09/2020), 2.75 mg (04/08/2020), 2.75 mg (04/11/2020), 2.75 mg (04/15/2020), 2.75 mg (04/18/2020)  for chemotherapy treatment.       CANCER STAGING: Cancer Staging Multiple myeloma ([place]) Staging form: Plasma Cell Myeloma and Plasma Cell Disorders, AJCC 8th Edition - Clinical stage from 05/03/2017: Beta-2-microglobulin (mg/L): 3.5, Albumin (g/dL): 3.4, ISS: Stage II, High-risk cytogenetics: Absent, LDH: Not assessed - Signed by Baird Cancer, PA-C on 05/03/2017    INTERVAL HISTORY:  Johnny Lucas 74 y.o. male seen for follow-up of for multiple myeloma.  Appetite is 100%.  Energy levels are 75%.  Numbness in the hands and feet has been stable.  He was evaluated by Dr. Norma Fredrickson on 05/14/2020.  Denies any fevers or chills.  REVIEW OF SYSTEMS:  Review of Systems  Neurological: Positive for numbness.  Psychiatric/Behavioral: Positive for sleep disturbance.  All other systems reviewed and are negative.    PAST MEDICAL/SURGICAL HISTORY:  Past Medical History:  Diagnosis Date  . Anxiety   . Bone metastases ([place]) 09/10/2016  . Breast cancer ([place]) 2011   Stave IV breast cancer; radiation and tamoxifen    . Breast cancer, male ([place])    Stave IV breast cancer; radiation and tamoxifen  Overview:  Left breast ca with mets bone  Overview:  METS TO BONE  . GERD (gastroesophageal reflux disease)   . Hypertension   . Macular degeneration   . Multiple myeloma ([place]) 02/18/2017  . Peripheral neuropathy    Past Surgical History:  Procedure Laterality Date  . BACK SURGERY    . CATARACT EXTRACTION W/PHACO Left 02/03/2019   Procedure: CATARACT EXTRACTION PHACO AND INTRAOCULAR LENS PLACEMENT ([place]);  Surgeon: Baruch Goldmann, MD;  Location: [facility];  Service: Ophthalmology;  Laterality: Left;  CDE: 15.89  . HERNIA REPAIR    . PORTACATH PLACEMENT Left 06/10/2018   Procedure: INSERTION PORT-A-CATH;  Surgeon: Virl Cagey, MD;  Location: [facility];  Service: General;  Laterality: Left;  . right arm surgery       SOCIAL HISTORY:  Social History   Socioeconomic History  . Marital status: Married    Spouse name: Not on file  . Number of children: 3  . Years of education: Not on file  . Highest education level: Not on file  Occupational History  . Not on file  Tobacco Use  . Smoking status: Never Smoker  . Smokeless tobacco: Former Network engineer and Sexual Activity  . Alcohol use: Yes    Alcohol/week: 24.0 standard drinks    Types: 24 Cans of beer per week  . Drug use: No  . Sexual activity: Not on file  Other Topics  Concern  . Not on file  Social History Narrative  . Not on file   Social Determinants of Health   Financial Resource Strain:   . Difficulty of Paying Living Expenses:   Food Insecurity:   . Worried About Charity fundraiser in the Last Year:   . Arboriculturist in the Last Year:   Transportation Needs:   . Film/video editor (Medical):   Marland Kitchen Lack of Transportation (Non-Medical):   Physical Activity:   . Days of Exercise per Week:   . Minutes of Exercise per Session:   Stress:   . Feeling of Stress :   Social Connections:   . Frequency of Communication with Friends  and Family:   . Frequency of Social Gatherings with Friends and Family:   . Attends Religious Services:   . Active Member of Clubs or Organizations:   . Attends Archivist Meetings:   Marland Kitchen Marital Status:   Intimate Partner Violence:   . Fear of Current or Ex-Partner:   . Emotionally Abused:   Marland Kitchen Physically Abused:   . Sexually Abused:     FAMILY HISTORY:  Family History  Problem Relation Age of Onset  . Stroke Mother   . Cancer Maternal Aunt        cancer NOS; died in her 69s  . Lung cancer Maternal Uncle        smoker    CURRENT MEDICATIONS:  Outpatient Encounter Medications as of 05/20/2020  Medication Sig  . acyclovir (ZOVIRAX) 800 MG tablet TAKE 1 TABLET TWICE DAILY  . amLODipine (NORVASC) 5 MG tablet Take 1 tablet (5 mg total) by mouth daily.  Marland Kitchen aspirin EC 81 MG tablet Take 81 mg by mouth daily.  . calcium-vitamin D (OSCAL WITH D) 500-200 MG-UNIT TABS tablet TAKE TWO TABLETS BY MOUTH DAILY  . CARFILZOMIB IV Inject into the vein.  . Cholecalciferol (VITAMIN D) 50 MCG (2000 UT) CAPS Take 4,000 Units by mouth 2 (two) times daily.  Marland Kitchen denosumab (XGEVA) 120 MG/1.7ML SOLN injection Inject 120 mg into the skin every 30 (thirty) days.   Marland Kitchen dexamethasone (DECADRON) 4 MG tablet Take 5 pills before each treatment  . diclofenac Sodium (VOLTAREN) 1 % GEL Use 1 to 2 times per day as directed by doctor  . folic acid (FOLVITE) 1 MG tablet TAKE 1 TABLET BY MOUTH DAILY  . gabapentin (NEURONTIN) 300 MG capsule Take 1 capsule (300 mg total) by mouth 2 (two) times daily.  Marland Kitchen loratadine (CLARITIN) 10 MG tablet Take 1 tablet (10 mg total) by mouth daily.  . metoprolol succinate (TOPROL-XL) 100 MG 24 hr tablet Take 100 mg by mouth daily.   . Multiple Vitamin (MULTIVITAMIN WITH MINERALS) TABS tablet Take 1 tablet by mouth daily.  . pantoprazole (PROTONIX) 40 MG tablet Take 40 mg by mouth every other day.   . pomalidomide (POMALYST) 3 MG capsule Take 1 capsule (3 mg total) by mouth daily.  .  tamoxifen (NOLVADEX) 20 MG tablet TAKE 1 TABLET (20 MG TOTAL) BY MOUTH DAILY.  . vitamin B-12 (CYANOCOBALAMIN) 1000 MCG tablet Take 1,000 mcg by mouth daily.  Marland Kitchen ALPRAZolam (XANAX) 0.5 MG tablet TAKE 1 TABLET BY MOUTH AT BEDTIME AS NEEDED FOR ANXIETY OR SLEEP (Patient not taking: Reported on 05/20/2020)  . augmented betamethasone dipropionate (DIPROLENE-AF) 0.05 % cream Apply topically as needed. (Patient not taking: Reported on 05/20/2020)  . hydrALAZINE (APRESOLINE) 50 MG tablet Take 1 tablet (50 mg total) by mouth every  8 (eight) hours. (Patient not taking: Reported on 05/20/2020)  . ondansetron (ZOFRAN) 8 MG tablet TAKE ONE TABLET BY MOUTH EVERY 8 HOURS AS NEEDED (Patient not taking: Reported on 05/20/2020)  . polyethylene glycol (MIRALAX / GLYCOLAX) packet Take 17 g by mouth daily as needed for mild constipation. (Patient not taking: Reported on 05/20/2020)  . prochlorperazine (COMPAZINE) 10 MG tablet Take 1 tablet (10 mg total) by mouth every 6 (six) hours as needed for nausea or vomiting. (Patient not taking: Reported on 05/20/2020)  . pseudoephedrine-guaifenesin (MUCINEX D) 60-600 MG 12 hr tablet Take 1 tablet by mouth 2 (two) times daily as needed for congestion.   . [DISCONTINUED] ALPRAZolam (XANAX) 0.5 MG tablet TAKE 1 TABLET BY MOUTH AT BEDTIME AS NEEDED FOR ANXIETY OR SLEEP   No facility-administered encounter medications on file as of 05/20/2020.    ALLERGIES:  Allergies  Allergen Reactions  . Cefepime     Suspected severe thrombocytopenia is a result of cefepime induced antigen platelet destruction     PHYSICAL EXAM:  ECOG Performance status: 1  Vitals:   05/20/20 0954  BP: 122/64  Pulse: 65  Resp: 18  Temp: (!) 97.3 F (36.3 C)  SpO2: 99%   Filed Weights   05/20/20 0954  Weight: 212 lb 4.8 oz (96.3 kg)    Physical Exam Constitutional:      Appearance: Normal appearance.  HENT:     Head: Normocephalic.  Eyes:     Extraocular Movements: Extraocular movements intact.      Conjunctiva/sclera: Conjunctivae normal.  Cardiovascular:     Rate and Rhythm: Normal rate and regular rhythm.     Pulses: Normal pulses.     Heart sounds: Normal heart sounds.  Pulmonary:     Effort: Pulmonary effort is normal.     Breath sounds: Normal breath sounds.  Musculoskeletal:        General: Normal range of motion.     Cervical back: Normal range of motion.  Skin:    General: Skin is warm and dry.  Neurological:     General: No focal deficit present.     Mental Status: He is alert and oriented to person, place, and time. Mental status is at baseline.  Psychiatric:        Mood and Affect: Mood normal.        Behavior: Behavior normal.        Thought Content: Thought content normal.        Judgment: Judgment normal.      LABORATORY DATA:  I have reviewed the labs as listed.  CBC    Component Value Date/Time   WBC 4.7 05/20/2020 0914   RBC 2.84 (L) 05/20/2020 0914   HGB 10.0 (L) 05/20/2020 0914   HCT 31.2 (L) 05/20/2020 0914   PLT 215 05/20/2020 0914   MCV 109.9 (H) 05/20/2020 0914   MCH 35.2 (H) 05/20/2020 0914   MCHC 32.1 05/20/2020 0914   RDW 14.3 05/20/2020 0914   LYMPHSABS 0.7 05/20/2020 0914   MONOABS 0.6 05/20/2020 0914   EOSABS 0.2 05/20/2020 0914   BASOSABS 0.0 05/20/2020 0914   CMP Latest Ref Rng & Units 05/20/2020 05/06/2020 04/29/2020  Glucose 70 - 99 mg/dL 105(H) 113(H) 114(H)  BUN 8 - 23 mg/dL _0 Creatinine 0.61 - 1.24 mg/dL 1.61(H) 1.86(H) 1.87(H)  Sodium 135 - 145 mmol/L 138 136 137  Potassium 3.5 - 5.1 mmol/L 4.2 4.1 4.3  Chloride 98 - 111 mmol/L 105 103 104  CO2 22 - 32 mmol/L _0 Calcium 8.9 - 10.3 mg/dL 8.7(L) 8.8(L) 9.2  Total Protein 6.5 - 8.1 g/dL 5.9(L) 5.7(L) 5.7(L)  Total Bilirubin 0.3 - 1.2 mg/dL 0.6 0.6 0.6  Alkaline Phos 38 - 126 U/L 23(L) 20(L) 25(L)  AST 15 - 41 U/L _1 ALT 0 - 44 U/L _2 Radiology: I have reviewed scans and outside medical records.  ASSESSMENT & PLAN:  1.  Relapsed  multiple myeloma: -Autologous stem cell transplant on 10/08/2017 -Post transplant consolidation with CyCarD for 2 cycles with M spike undetectable. -Maintenance therapy with carfilzomib 70 mg per metered square days 1 and 15 every 28 days from 04/20/2018, dose reduced to 35 mg per metered square on 01/05/2019, titrated up to 56 mg per metered square on 01/09/2020. 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 -PET scan on 02/18/2020 showed new left supraclavicular lymph node at the thoracic inlet, SUV 14.2.  Multifocal osseous lesions largely improved, though residual lesions noted including right acromion, left acromion, thyroid cartilage, right sternum.  Additional lesions throughout the sternum, bilateral ribs, thoracolumbar spine, bilateral pelvis have resolved. -BMBX on 02/14/2020 shows plasma cell myeloma, 60-70% of cells.  FISH for myeloma was negative.  Cytogenetics was normal. -4 cycles of daratumumab, bortezomib and dexamethasone from 02/28/2020 through 04/29/2020.  PLAN:  1.  Relapsed IgG kappa multiple myeloma: -He completed 4 cycles of daratumumab, bortezomib and dexamethasone without improvement in serology. -Dr. Norma Fredrickson recommended changing therapy to daratumumab, pomalidomide and dexamethasone. -We talked about side effects of pomalidomide in detail.  He has not received the medication yet.  We will start first cycle next week when he receives Pomalyst.  We will start Pomalyst at 3 mg days 1-21.  Dexamethasone will be 20 mg weekly. -As he already received more than 8 weekly treatments, Dara subcu will be changed to day 1 and day 15 every 28 days for the next 4 cycles. -We will see him back in 2 weeks for follow-up.  2.  Myeloma bone disease: -Continue denosumab.  3.  Macrocytic anemia: -He is receiving Procrit for hemoglobin less than 9 as needed.  4.  ID prophylaxis: -Continue acyclovir twice daily.  5.  Peripheral neuropathy: -Continue  gabapentin 300 mg twice daily.  6.  History of breast cancer: -Diagnosed in 2011, on tamoxifen.    Orders placed this encounter:  No orders of the defined types were placed in this encounter.     Derek Jack, MD [place] [phone]

## 2020-05-20 NOTE — Progress Notes (Signed)
Per MD, hold treatment today. Pt discharged by front office staff.

## 2020-05-23 ENCOUNTER — Ambulatory Visit (HOSPITAL_COMMUNITY): Payer: Medicare Other

## 2020-05-24 NOTE — Addendum Note (Signed)
Addended by: Derek Jack on: 05/24/2020 12:29 PM   Modules accepted: Orders

## 2020-05-27 ENCOUNTER — Other Ambulatory Visit (HOSPITAL_COMMUNITY): Payer: Self-pay | Admitting: Nurse Practitioner

## 2020-05-27 DIAGNOSIS — C9000 Multiple myeloma not having achieved remission: Secondary | ICD-10-CM

## 2020-05-28 ENCOUNTER — Other Ambulatory Visit (HOSPITAL_COMMUNITY): Payer: Medicare Other

## 2020-05-28 ENCOUNTER — Other Ambulatory Visit: Payer: Self-pay

## 2020-05-28 ENCOUNTER — Inpatient Hospital Stay (HOSPITAL_COMMUNITY): Payer: Medicare Other | Attending: Hematology

## 2020-05-28 ENCOUNTER — Inpatient Hospital Stay (HOSPITAL_COMMUNITY): Payer: Medicare Other

## 2020-05-28 ENCOUNTER — Ambulatory Visit (HOSPITAL_COMMUNITY): Payer: Medicare Other

## 2020-05-28 VITALS — BP 155/73 | HR 63 | Temp 97.7°F | Resp 18 | Wt 215.4 lb

## 2020-05-28 DIAGNOSIS — Z801 Family history of malignant neoplasm of trachea, bronchus and lung: Secondary | ICD-10-CM | POA: Diagnosis not present

## 2020-05-28 DIAGNOSIS — Z5112 Encounter for antineoplastic immunotherapy: Secondary | ICD-10-CM | POA: Diagnosis not present

## 2020-05-28 DIAGNOSIS — D539 Nutritional anemia, unspecified: Secondary | ICD-10-CM | POA: Insufficient documentation

## 2020-05-28 DIAGNOSIS — C9000 Multiple myeloma not having achieved remission: Secondary | ICD-10-CM

## 2020-05-28 DIAGNOSIS — Z9221 Personal history of antineoplastic chemotherapy: Secondary | ICD-10-CM | POA: Diagnosis not present

## 2020-05-28 DIAGNOSIS — Z79899 Other long term (current) drug therapy: Secondary | ICD-10-CM | POA: Insufficient documentation

## 2020-05-28 DIAGNOSIS — G629 Polyneuropathy, unspecified: Secondary | ICD-10-CM | POA: Diagnosis not present

## 2020-05-28 DIAGNOSIS — R531 Weakness: Secondary | ICD-10-CM | POA: Insufficient documentation

## 2020-05-28 DIAGNOSIS — Z853 Personal history of malignant neoplasm of breast: Secondary | ICD-10-CM | POA: Insufficient documentation

## 2020-05-28 DIAGNOSIS — Z7982 Long term (current) use of aspirin: Secondary | ICD-10-CM | POA: Insufficient documentation

## 2020-05-28 DIAGNOSIS — Z923 Personal history of irradiation: Secondary | ICD-10-CM | POA: Insufficient documentation

## 2020-05-28 LAB — COMPREHENSIVE METABOLIC PANEL
ALT: 15 U/L (ref 0–44)
AST: 19 U/L (ref 15–41)
Albumin: 3.9 g/dL (ref 3.5–5.0)
Alkaline Phosphatase: 25 U/L — ABNORMAL LOW (ref 38–126)
Anion gap: 9 (ref 5–15)
BUN: 19 mg/dL (ref 8–23)
CO2: 26 mmol/L (ref 22–32)
Calcium: 10.2 mg/dL (ref 8.9–10.3)
Chloride: 101 mmol/L (ref 98–111)
Creatinine, Ser: 1.63 mg/dL — ABNORMAL HIGH (ref 0.61–1.24)
GFR calc Af Amer: 48 mL/min — ABNORMAL LOW (ref >60)
GFR calc non Af Amer: 41 mL/min — ABNORMAL LOW (ref >60)
Glucose, Bld: 124 mg/dL — ABNORMAL HIGH (ref 70–99)
Potassium: 4.4 mmol/L (ref 3.5–5.1)
Sodium: 136 mmol/L (ref 135–145)
Total Bilirubin: 0.4 mg/dL (ref 0.3–1.2)
Total Protein: 6.3 g/dL — ABNORMAL LOW (ref 6.5–8.1)

## 2020-05-28 LAB — CBC WITH DIFFERENTIAL/PLATELET
Abs Immature Granulocytes: 0.04 10*3/uL (ref 0.00–0.07)
Basophils Absolute: 0 10*3/uL (ref 0.0–0.1)
Basophils Relative: 0 %
Eosinophils Absolute: 0 10*3/uL (ref 0.0–0.5)
Eosinophils Relative: 0 %
HCT: 32.3 % — ABNORMAL LOW (ref 39.0–52.0)
Hemoglobin: 10.6 g/dL — ABNORMAL LOW (ref 13.0–17.0)
Immature Granulocytes: 0 %
Lymphocytes Relative: 8 %
Lymphs Abs: 0.7 10*3/uL (ref 0.7–4.0)
MCH: 34.8 pg — ABNORMAL HIGH (ref 26.0–34.0)
MCHC: 32.8 g/dL (ref 30.0–36.0)
MCV: 105.9 fL — ABNORMAL HIGH (ref 80.0–100.0)
Monocytes Absolute: 0.8 10*3/uL (ref 0.1–1.0)
Monocytes Relative: 8 %
Neutro Abs: 7.7 10*3/uL (ref 1.7–7.7)
Neutrophils Relative %: 84 %
Platelets: 188 10*3/uL (ref 150–400)
RBC: 3.05 MIL/uL — ABNORMAL LOW (ref 4.22–5.81)
RDW: 13.9 % (ref 11.5–15.5)
WBC: 9.3 10*3/uL (ref 4.0–10.5)
nRBC: 0 % (ref 0.0–0.2)

## 2020-05-28 LAB — MAGNESIUM: Magnesium: 1.9 mg/dL (ref 1.7–2.4)

## 2020-05-28 LAB — LACTATE DEHYDROGENASE: LDH: 146 U/L (ref 98–192)

## 2020-05-28 MED ORDER — ACETAMINOPHEN 325 MG PO TABS: 650.0000 mg | ORAL_TABLET | Freq: Once | ORAL | Status: DC

## 2020-05-28 MED ORDER — DEXAMETHASONE 4 MG PO TABS: 20.0000 mg | ORAL_TABLET | Freq: Once | ORAL | Status: DC

## 2020-05-28 MED ORDER — DARATUMUMAB-HYALURONIDASE-FIHJ 1800-30000 MG-UT/15ML ~~LOC~~ SOLN
1800.0000 mg | Freq: Once | SUBCUTANEOUS | Status: AC
  Administered 2020-05-28: 1800 mg via SUBCUTANEOUS
  Filled 2020-05-28: qty 15

## 2020-05-28 MED ORDER — DIPHENHYDRAMINE HCL 25 MG PO CAPS: 50.0000 mg | ORAL_CAPSULE | Freq: Once | ORAL | Status: DC

## 2020-05-28 NOTE — Progress Notes (Signed)
Labs meet parameters for treatment today . Will proceed as planned.  Hemoglobin 10.6 today, no retacrit needed per parameters.    Treatment given per orders. Patient tolerated it well without problems. Vitals stable and discharged home from clinic ambulatory. Follow up as scheduled.

## 2020-05-28 NOTE — Patient Instructions (Signed)
[facility] Discharge Instructions for Patients Receiving Chemotherapy  Today you received the following chemotherapy agents   To help prevent nausea and vomiting after your treatment, we encourage you to take your nausea medication   If you develop nausea and vomiting that is not controlled by your nausea medication, call the clinic.   BELOW ARE SYMPTOMS THAT SHOULD BE REPORTED IMMEDIATELY:  *FEVER GREATER THAN 100.5 F  *CHILLS WITH OR WITHOUT FEVER  NAUSEA AND VOMITING THAT IS NOT CONTROLLED WITH YOUR NAUSEA MEDICATION  *UNUSUAL SHORTNESS OF BREATH  *UNUSUAL BRUISING OR BLEEDING  TENDERNESS IN MOUTH AND THROAT WITH OR WITHOUT PRESENCE OF ULCERS  *URINARY PROBLEMS  *BOWEL PROBLEMS  UNUSUAL RASH Items with * indicate a potential emergency and should be followed up as soon as possible.  Feel free to call the clinic should you have any questions or concerns. The clinic phone number is [phone].  Please show the CHEMO ALERT CARD at check-in to the Emergency Department and triage nurse.   

## 2020-05-31 ENCOUNTER — Ambulatory Visit (HOSPITAL_COMMUNITY): Payer: Medicare Other

## 2020-05-31 ENCOUNTER — Other Ambulatory Visit (HOSPITAL_COMMUNITY): Payer: Medicare Other

## 2020-06-03 ENCOUNTER — Inpatient Hospital Stay (HOSPITAL_COMMUNITY): Payer: Medicare Other

## 2020-06-03 ENCOUNTER — Encounter (HOSPITAL_COMMUNITY): Payer: Self-pay

## 2020-06-03 ENCOUNTER — Other Ambulatory Visit: Payer: Self-pay

## 2020-06-03 VITALS — BP 124/76 | HR 73 | Temp 97.5°F | Resp 14

## 2020-06-03 DIAGNOSIS — D539 Nutritional anemia, unspecified: Secondary | ICD-10-CM | POA: Diagnosis not present

## 2020-06-03 DIAGNOSIS — C9000 Multiple myeloma not having achieved remission: Secondary | ICD-10-CM

## 2020-06-03 DIAGNOSIS — Z9221 Personal history of antineoplastic chemotherapy: Secondary | ICD-10-CM | POA: Diagnosis not present

## 2020-06-03 DIAGNOSIS — G629 Polyneuropathy, unspecified: Secondary | ICD-10-CM | POA: Diagnosis not present

## 2020-06-03 DIAGNOSIS — R531 Weakness: Secondary | ICD-10-CM | POA: Diagnosis not present

## 2020-06-03 DIAGNOSIS — C7951 Secondary malignant neoplasm of bone: Secondary | ICD-10-CM

## 2020-06-03 DIAGNOSIS — Z5112 Encounter for antineoplastic immunotherapy: Secondary | ICD-10-CM | POA: Diagnosis not present

## 2020-06-03 LAB — COMPREHENSIVE METABOLIC PANEL
ALT: 18 U/L (ref 0–44)
AST: 16 U/L (ref 15–41)
Albumin: 3.7 g/dL (ref 3.5–5.0)
Alkaline Phosphatase: 24 U/L — ABNORMAL LOW (ref 38–126)
Anion gap: 9 (ref 5–15)
BUN: 23 mg/dL (ref 8–23)
CO2: 23 mmol/L (ref 22–32)
Calcium: 9.2 mg/dL (ref 8.9–10.3)
Chloride: 103 mmol/L (ref 98–111)
Creatinine, Ser: 1.82 mg/dL — ABNORMAL HIGH (ref 0.61–1.24)
GFR calc Af Amer: 42 mL/min — ABNORMAL LOW (ref >60)
GFR calc non Af Amer: 36 mL/min — ABNORMAL LOW (ref >60)
Glucose, Bld: 106 mg/dL — ABNORMAL HIGH (ref 70–99)
Potassium: 4 mmol/L (ref 3.5–5.1)
Sodium: 135 mmol/L (ref 135–145)
Total Bilirubin: 0.3 mg/dL (ref 0.3–1.2)
Total Protein: 6.2 g/dL — ABNORMAL LOW (ref 6.5–8.1)

## 2020-06-03 MED ORDER — DENOSUMAB 120 MG/1.7ML ~~LOC~~ SOLN
120.0000 mg | Freq: Once | SUBCUTANEOUS | Status: AC
  Administered 2020-06-03: 120 mg via SUBCUTANEOUS
  Filled 2020-06-03: qty 1.7

## 2020-06-03 NOTE — Progress Notes (Signed)
Patient taking calcium as directed.  Denied tooth, jaw, and leg pain.  No recent or upcoming dental visits.  Patient tolerated injection with no complaints.  Site clean and dry with no bruising or swelling noted at site.  Band aid applied.  VSS with discharge and left ambulatory with no s/s of distress noted. 

## 2020-06-05 DIAGNOSIS — I10 Essential (primary) hypertension: Secondary | ICD-10-CM | POA: Diagnosis not present

## 2020-06-05 DIAGNOSIS — C9000 Multiple myeloma not having achieved remission: Secondary | ICD-10-CM | POA: Diagnosis not present

## 2020-06-08 ENCOUNTER — Other Ambulatory Visit (HOSPITAL_COMMUNITY): Payer: Self-pay | Admitting: Hematology

## 2020-06-08 DIAGNOSIS — I10 Essential (primary) hypertension: Secondary | ICD-10-CM

## 2020-06-08 DIAGNOSIS — G4700 Insomnia, unspecified: Secondary | ICD-10-CM

## 2020-06-10 ENCOUNTER — Ambulatory Visit (HOSPITAL_COMMUNITY): Payer: Medicare Other | Admitting: Hematology

## 2020-06-10 ENCOUNTER — Other Ambulatory Visit (HOSPITAL_COMMUNITY): Payer: Medicare Other

## 2020-06-10 ENCOUNTER — Ambulatory Visit (HOSPITAL_COMMUNITY): Payer: Medicare Other

## 2020-06-11 ENCOUNTER — Inpatient Hospital Stay (HOSPITAL_BASED_OUTPATIENT_CLINIC_OR_DEPARTMENT_OTHER): Payer: Medicare Other | Admitting: Hematology

## 2020-06-11 ENCOUNTER — Inpatient Hospital Stay (HOSPITAL_COMMUNITY): Payer: Medicare Other

## 2020-06-11 ENCOUNTER — Other Ambulatory Visit: Payer: Self-pay

## 2020-06-11 VITALS — BP 117/76 | HR 74 | Temp 98.0°F | Resp 18 | Wt 206.5 lb

## 2020-06-11 DIAGNOSIS — G629 Polyneuropathy, unspecified: Secondary | ICD-10-CM | POA: Diagnosis not present

## 2020-06-11 DIAGNOSIS — Z9221 Personal history of antineoplastic chemotherapy: Secondary | ICD-10-CM | POA: Diagnosis not present

## 2020-06-11 DIAGNOSIS — R531 Weakness: Secondary | ICD-10-CM | POA: Diagnosis not present

## 2020-06-11 DIAGNOSIS — C9000 Multiple myeloma not having achieved remission: Secondary | ICD-10-CM

## 2020-06-11 DIAGNOSIS — D539 Nutritional anemia, unspecified: Secondary | ICD-10-CM | POA: Diagnosis not present

## 2020-06-11 DIAGNOSIS — Z5112 Encounter for antineoplastic immunotherapy: Secondary | ICD-10-CM | POA: Diagnosis not present

## 2020-06-11 LAB — COMPREHENSIVE METABOLIC PANEL
ALT: 20 U/L (ref 0–44)
AST: 16 U/L (ref 15–41)
Albumin: 4.1 g/dL (ref 3.5–5.0)
Alkaline Phosphatase: 25 U/L — ABNORMAL LOW (ref 38–126)
Anion gap: 11 (ref 5–15)
BUN: 16 mg/dL (ref 8–23)
CO2: 25 mmol/L (ref 22–32)
Calcium: 9.3 mg/dL (ref 8.9–10.3)
Chloride: 101 mmol/L (ref 98–111)
Creatinine, Ser: 1.65 mg/dL — ABNORMAL HIGH (ref 0.61–1.24)
GFR calc Af Amer: 47 mL/min — ABNORMAL LOW (ref >60)
GFR calc non Af Amer: 41 mL/min — ABNORMAL LOW (ref >60)
Glucose, Bld: 115 mg/dL — ABNORMAL HIGH (ref 70–99)
Potassium: 4.1 mmol/L (ref 3.5–5.1)
Sodium: 137 mmol/L (ref 135–145)
Total Bilirubin: 0.5 mg/dL (ref 0.3–1.2)
Total Protein: 6.2 g/dL — ABNORMAL LOW (ref 6.5–8.1)

## 2020-06-11 LAB — CBC WITH DIFFERENTIAL/PLATELET
Abs Immature Granulocytes: 0.03 10*3/uL (ref 0.00–0.07)
Basophils Absolute: 0.1 10*3/uL (ref 0.0–0.1)
Basophils Relative: 1 %
Eosinophils Absolute: 0.3 10*3/uL (ref 0.0–0.5)
Eosinophils Relative: 6 %
HCT: 34.6 % — ABNORMAL LOW (ref 39.0–52.0)
Hemoglobin: 11.6 g/dL — ABNORMAL LOW (ref 13.0–17.0)
Immature Granulocytes: 1 %
Lymphocytes Relative: 10 %
Lymphs Abs: 0.6 10*3/uL — ABNORMAL LOW (ref 0.7–4.0)
MCH: 34.7 pg — ABNORMAL HIGH (ref 26.0–34.0)
MCHC: 33.5 g/dL (ref 30.0–36.0)
MCV: 103.6 fL — ABNORMAL HIGH (ref 80.0–100.0)
Monocytes Absolute: 0.7 10*3/uL (ref 0.1–1.0)
Monocytes Relative: 12 %
Neutro Abs: 4.4 10*3/uL (ref 1.7–7.7)
Neutrophils Relative %: 70 %
Platelets: 148 10*3/uL — ABNORMAL LOW (ref 150–400)
RBC: 3.34 MIL/uL — ABNORMAL LOW (ref 4.22–5.81)
RDW: 13.7 % (ref 11.5–15.5)
WBC: 6.1 10*3/uL (ref 4.0–10.5)
nRBC: 0 % (ref 0.0–0.2)

## 2020-06-11 LAB — MAGNESIUM: Magnesium: 1.8 mg/dL (ref 1.7–2.4)

## 2020-06-11 LAB — LACTATE DEHYDROGENASE: LDH: 129 U/L (ref 98–192)

## 2020-06-11 MED ORDER — DEXAMETHASONE 4 MG PO TABS: 20.0000 mg | ORAL_TABLET | Freq: Once | ORAL | Status: DC

## 2020-06-11 MED ORDER — DARATUMUMAB-HYALURONIDASE-FIHJ 1800-30000 MG-UT/15ML ~~LOC~~ SOLN
1800.0000 mg | Freq: Once | SUBCUTANEOUS | Status: AC
  Administered 2020-06-11: 1800 mg via SUBCUTANEOUS
  Filled 2020-06-11: qty 15

## 2020-06-11 MED ORDER — HEPARIN SOD (PORK) LOCK FLUSH 100 UNIT/ML IV SOLN
500.0000 [IU] | Freq: Once | INTRAVENOUS | Status: AC
  Administered 2020-06-11: 500 [IU] via INTRAVENOUS

## 2020-06-11 MED ORDER — SODIUM CHLORIDE 0.9 % IV SOLN: INTRAVENOUS | Status: AC

## 2020-06-11 MED ORDER — DIPHENHYDRAMINE HCL 25 MG PO CAPS
50.0000 mg | ORAL_CAPSULE | Freq: Once | ORAL | Status: AC
  Administered 2020-06-11: 50 mg via ORAL
  Filled 2020-06-11: qty 2

## 2020-06-11 MED ORDER — ACETAMINOPHEN 325 MG PO TABS
650.0000 mg | ORAL_TABLET | Freq: Once | ORAL | Status: AC
  Administered 2020-06-11: 650 mg via ORAL
  Filled 2020-06-11: qty 2

## 2020-06-11 MED ORDER — SODIUM CHLORIDE 0.9% FLUSH
10.0000 mL | Freq: Once | INTRAVENOUS | Status: AC
  Administered 2020-06-11: 10 mL via INTRAVENOUS

## 2020-06-11 NOTE — Patient Instructions (Signed)
[place] at [facility] Discharge Instructions  You were seen today by Dr. Delton Coombes. He went over your recent results. Stop taking the 3 mg of Pomalyst for 2 weeks and you will be prescribed 2 mg of Pomalyst daily afterwards. Please continue your routine care with your primary care provider. You will be seen by the NP or PA in 2 weeks for labs and follow up.   Thank you for choosing [place] at [facility] to provide your oncology and hematology care.  To afford each patient quality time with our provider, please arrive at least 15 minutes before your scheduled appointment time.   If you have a lab appointment with the [facility] please come in thru the Main Entrance and check in at the main information desk  You need to re-schedule your appointment should you arrive 10 or more minutes late.  We strive to give you quality time with our providers, and arriving late affects you and other patients whose appointments are after yours.  Also, if you no show three or more times for appointments you may be dismissed from the clinic at the providers discretion.     Again, thank you for choosing [facility].  Our hope is that these requests will decrease the amount of time that you wait before being seen by our physicians.       _____________________________________________________________  Should you have questions after your visit to [facility], please contact our office at [phone] between the hours of 8:00 a.m. and 4:30 p.m.  Voicemails left after 4:00 p.m. will not be returned until the following business day.  For prescription refill requests, have your pharmacy contact our office and allow 72 hours.    Cancer Center Support Programs:   > Cancer Support Group  2nd Tuesday of the month 1pm-2pm, Journey Room

## 2020-06-11 NOTE — Patient Instructions (Signed)
[facility] Discharge Instructions for Patients Receiving Chemotherapy   Beginning January 23rd 2017 lab work for the Cancer Center will be done in the  Main lab at [facility] on 1st floor. If you have a lab appointment with the Cancer Center please come in thru the  Main Entrance and check in at the main information desk   Today you received the following chemotherapy agents Daratumumab  To help prevent nausea and vomiting after your treatment, we encourage you to take your nausea medication   If you develop nausea and vomiting, or diarrhea that is not controlled by your medication, call the clinic.  The clinic phone number is [phone]. Office hours are Monday-Friday 8:30am-5:00pm.  BELOW ARE SYMPTOMS THAT SHOULD BE REPORTED IMMEDIATELY:  *FEVER GREATER THAN 101.0 F  *CHILLS WITH OR WITHOUT FEVER  NAUSEA AND VOMITING THAT IS NOT CONTROLLED WITH YOUR NAUSEA MEDICATION  *UNUSUAL SHORTNESS OF BREATH  *UNUSUAL BRUISING OR BLEEDING  TENDERNESS IN MOUTH AND THROAT WITH OR WITHOUT PRESENCE OF ULCERS  *URINARY PROBLEMS  *BOWEL PROBLEMS  UNUSUAL RASH Items with * indicate a potential emergency and should be followed up as soon as possible. If you have an emergency after office hours please contact your primary care physician or go to the nearest emergency department.  Please call the clinic during office hours if you have any questions or concerns.   You may also contact the Patient Navigator at [phone] should you have any questions or need assistance in obtaining follow up care.      Resources For Cancer Patients and their Caregivers ? American Cancer Society: Can assist with transportation, wigs, general needs, runs Look Good Feel Better.        1-888-227-6333 ? Cancer Care: Provides financial assistance, online support groups, medication/co-pay assistance.  1-800-813-HOPE (4673) ? [facility] Assists [place]  cancer patients and their families through emotional , educational and financial support.  336-427-4357 ? [place] DSS Where to apply for food stamps, Medicaid and utility assistance. 336-342-1394 ? [facility]: Transportation to medical appointments. 336-347-2287 ? Social Security Administration: May apply for disability if have a Stage IV cancer. 336-342-7796 1-800-772-1213 ? [place] Aging, Disability and Transit Services: Assists with nutrition, care and transit needs. 336-349-2343          

## 2020-06-11 NOTE — Progress Notes (Signed)
Johnny Lucas presents today for D1C6 Daratumumab injection. Pt denies any new changes or symptoms since last treatment. Labs work and vitals signs reviewed, including Cr. 1.65. Pt has been assessed by Dr. Delton Coombes who has approved proceeding with treatment today. In addition, VO given to infuse 551mL NS over 1 hour.  Injection and infusion tolerated without incident or complaint. Discharged in satisfactory condition with follow up instructions.

## 2020-06-11 NOTE — Progress Notes (Signed)
Johnny Lucas, [place] [address]   CLINIC:  Medical Oncology/Hematology  PCP:  [name], MD [address] [place] [address] [phone]   REASON FOR VISIT:  Follow-up for multiple myeloma  PRIOR THERAPY: Stem cell transplant on 09/30/2017.  NGS Results: Not applicable.  CURRENT THERAPY: Darzalex, pomalidomide and dexamethasone.  BRIEF ONCOLOGIC HISTORY:  Oncology History  Breast cancer, male ([place])  12/31/2009 Initial Biopsy   Biopsy of L breast    12/31/2009 Pathology Results   Invasive ductal carcinoma, ER/PR+, HER 2 negative   12/31/2009 Imaging   Ultrasound showing a 2.43 x 1.85 x 3 cm hypoechoic spiculated mass in the 12 o clock L breast retroareolar region   01/01/2010 -  Anti-estrogen oral therapy   Tamoxifen 20 mg daily   01/06/2010 Imaging   Bone scan abnormal uptake in the diaphysis of the R humerus, abnormal in the R third, fifth and sixth ribs, lesion also noted in the sternum.   02/03/2010 Surgery   Rod placement and fixation of R humerus by Dr. [name]   02/05/2010 - 02/18/2010 Radiation Therapy   30Gy in 10 fractions of 3 Gy per fraction to R pathologic fracture   03/11/2010 -  Chemotherapy   Denosumab monthly, now every 3 months. Started at [name]    06/09/2016 Imaging   Three hypermetabolic osseous lesions in the sternum, left ilium and right ilium, as discussed above, likely represent osseous metastases. At this time, these are not recognizable on the CT images. 2. No extra skeletal metastatic disease identified in the neck, chest, abdomen or pelvis.   10/13/2016 Progression   PET shows various new and enlarging osseous metastatic lesions with no definite extra osseous metastatic disease currently identified.    12/31/2016 Progression   1. Multifocal hypermetabolic osseous metastases throughout the axial and proximal appendicular skeleton, which are increased in size, number and metabolism since 10/13/2016 PET-CT. 2.  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 3. No additional sites of hypermetabolic metastatic disease. 4. Chronic right mastoid sinusitis. 5. Aortic atherosclerosis.  One vessel coronary atherosclerosis.   Multiple myeloma ([place])  02/12/2017 Bone Marrow Biopsy   The marrow was variably cellular with large peritrabecular aggregates of kappa restricted plasma cells (66% by aspirate, 30% by Cd138). Cytogenetics +11.    03/01/2017 - 06/29/2017 Chemotherapy   RVD    05/26/2017 Bone Marrow Biopsy   Performed at [facility]:  Plasma cell myeloma in a 30% cellularmarrow with decreased trilineage hematopoiesis and 42% kappalight chain restricted plasma cells on the aspirate smears andlarge aggregates on the core biopsy.    07/12/2017 - 09/01/2017 Chemotherapy   3 cycles of carfizolmib/cyclophosphamide/dexamethasone     10/08/2017 Bone Marrow Transplant   Autotransplant at [facility]   02/28/2020 -  Chemotherapy   The patient had daratumumab-hyaluronidase-fihj (DARZALEX FASPRO) 1800-30000 MG-UT/15ML chemo SQ injection 1,800 mg, 1,800 mg, Subcutaneous,  Once, 5 of 11 cycles Administration: 1,800 mg (02/28/2020), 1,800 mg (03/05/2020), 1,800 mg (03/12/2020), 1,800 mg (03/18/2020), 1,800 mg (03/25/2020), 1,800 mg (04/01/2020), 1,800 mg (04/29/2020), 1,800 mg (04/08/2020), 1,800 mg (04/15/2020), 1,800 mg (04/22/2020), 1,800 mg (05/28/2020) bortezomib SQ (VELCADE) chemo injection 2.75 mg, 1.3 mg/m2 = 2.75 mg, Subcutaneous,  Once, 4 of 4 cycles Administration: 2.75 mg (02/28/2020), 2.75 mg (03/05/2020), 2.75 mg (03/08/2020), 2.75 mg (03/18/2020), 2.75 mg (03/21/2020), 2.75 mg (03/25/2020), 2.75 mg (03/28/2020),  2.75 mg (04/29/2020), 2.75 mg (05/02/2020), 2.75 mg (05/06/2020), 2.75 mg (05/09/2020), 2.75 mg (04/08/2020), 2.75 mg (04/11/2020), 2.75 mg (04/15/2020), 2.75 mg (04/18/2020)  for chemotherapy  treatment.    Multiple myeloma not having achieved remission ([place])  06/29/2017 Initial Diagnosis   Multiple myeloma not having achieved remission ([place])   02/28/2020 -  Chemotherapy   The patient had daratumumab-hyaluronidase-fihj (DARZALEX FASPRO) 1800-30000 MG-UT/15ML chemo SQ injection 1,800 mg, 1,800 mg, Subcutaneous,  Once, 5 of 11 cycles Administration: 1,800 mg (02/28/2020), 1,800 mg (03/05/2020), 1,800 mg (03/12/2020), 1,800 mg (03/18/2020), 1,800 mg (03/25/2020), 1,800 mg (04/01/2020), 1,800 mg (04/29/2020), 1,800 mg (04/08/2020), 1,800 mg (04/15/2020), 1,800 mg (04/22/2020), 1,800 mg (05/28/2020) bortezomib SQ (VELCADE) chemo injection 2.75 mg, 1.3 mg/m2 = 2.75 mg, Subcutaneous,  Once, 4 of 4 cycles Administration: 2.75 mg (02/28/2020), 2.75 mg (03/05/2020), 2.75 mg (03/08/2020), 2.75 mg (03/18/2020), 2.75 mg (03/21/2020), 2.75 mg (03/25/2020), 2.75 mg (03/28/2020), 2.75 mg (04/29/2020), 2.75 mg (05/02/2020), 2.75 mg (05/06/2020), 2.75 mg (05/09/2020), 2.75 mg (04/08/2020), 2.75 mg (04/11/2020), 2.75 mg (04/15/2020), 2.75 mg (04/18/2020)  for chemotherapy treatment.      CANCER STAGING: Cancer Staging Multiple myeloma ([place]) Staging form: Plasma Cell Myeloma and Plasma Cell Disorders, AJCC 8th Edition - Clinical stage from 05/03/2017: Beta-2-microglobulin (mg/L): 3.5, Albumin (g/dL): 3.4, ISS: Stage II, High-risk cytogenetics: Absent, LDH: Not assessed - Signed by Baird Cancer, PA-C on 05/03/2017   INTERVAL HISTORY:  [name], a 74 y.o. male, returns for routine follow-up and consideration for next cycle of chemotherapy. Levorn was last seen on 05/20/2020.  Due for cycle #6 of Darzalex Faspro today.   Today he complains of tingling in his entire legs bilat and lower back, which started 1 month ago, worsening fatigue, numbness in his hands bilat, itching, poor appetite, and dry mouth, even though he drinks water. He started taking Pomalyst on 05/26/2020 and will finish on 06/16/2020. He still takes dexamethasone  5 tablets every week.  He reports not feeling well enough for chemo today.   REVIEW OF SYSTEMS:  Review of Systems  Constitutional: Positive for appetite change (severely decreased) and fatigue (severe).  Respiratory: Positive for shortness of breath.   Gastrointestinal: Positive for constipation and nausea.  Musculoskeletal: Positive for back pain (5/10 lower back and legs).  Skin: Positive for itching.  Neurological: Positive for numbness (hands & feet).  All other systems reviewed and are negative.   PAST MEDICAL/SURGICAL HISTORY:  Past Medical History:  Diagnosis Date   Anxiety    Bone metastases ([place]) 09/10/2016   Breast cancer ([place]) 2011   Stave IV breast cancer; radiation and tamoxifen   Breast cancer, male ([place])    Stave IV breast cancer; radiation and tamoxifen  Overview:  Left breast ca with mets bone  Overview:  METS TO BONE   GERD (gastroesophageal reflux disease)    Hypertension    Macular degeneration    Multiple myeloma ([place]) 02/18/2017   Peripheral neuropathy    Past Surgical History:  Procedure Laterality Date   BACK SURGERY     CATARACT EXTRACTION W/PHACO Left 02/03/2019   Procedure: CATARACT EXTRACTION PHACO AND INTRAOCULAR LENS PLACEMENT ([place]);  Surgeon: Baruch Goldmann, MD;  Location: [facility];  Service: Ophthalmology;  Laterality: Left;  CDE: 15.89   HERNIA REPAIR     PORTACATH PLACEMENT Left 06/10/2018   Procedure: INSERTION PORT-A-CATH;  Surgeon: Virl Cagey, MD;  Location: [facility];  Service: General;  Laterality: Left;   right arm surgery  SOCIAL HISTORY:  Social History   Socioeconomic History   Marital status: Married    Spouse name: Not on file   Number of children: 3   Years of education: Not on file   Highest education level: Not on file  Occupational History   Not on file  Tobacco Use   Smoking status: Never Smoker   Smokeless tobacco: Former Systems developer  Substance and Sexual Activity   Alcohol use: Yes     Alcohol/week: 24.0 standard drinks    Types: 24 Cans of beer per week   Drug use: No   Sexual activity: Not on file  Other Topics Concern   Not on file  Social History Narrative   Not on file   Social Determinants of Health   Financial Resource Strain:    Difficulty of Paying Living Expenses:   Food Insecurity:    Worried About Charity fundraiser in the Last Year:    Arboriculturist in the Last Year:   Transportation Needs:    Film/video editor (Medical):    Lack of Transportation (Non-Medical):   Physical Activity:    Days of Exercise per Week:    Minutes of Exercise per Session:   Stress:    Feeling of Stress :   Social Connections:    Frequency of Communication with Friends and Family:    Frequency of Social Gatherings with Friends and Family:    Attends Religious Services:    Active Member of Clubs or Organizations:    Attends Music therapist:    Marital Status:   Intimate Partner Violence:    Fear of Current or Ex-Partner:    Emotionally Abused:    Physically Abused:    Sexually Abused:     FAMILY HISTORY:  Family History  Problem Relation Age of Onset   Stroke Mother    Cancer Maternal Aunt        cancer NOS; died in her 62s   Lung cancer Maternal Uncle        smoker    CURRENT MEDICATIONS:  Current Outpatient Medications  Medication Sig Dispense Refill   acyclovir (ZOVIRAX) 800 MG tablet TAKE 1 TABLET TWICE DAILY 180 tablet 3   amLODipine (NORVASC) 5 MG tablet Take 1 tablet (5 mg total) by mouth daily. 30 tablet 0   aspirin EC 81 MG tablet Take 81 mg by mouth daily.     calcium-vitamin D (OSCAL WITH D) 500-200 MG-UNIT TABS tablet TAKE TWO TABLETS BY MOUTH DAILY 60 tablet 6   Cholecalciferol (VITAMIN D) 50 MCG (2000 UT) CAPS Take 4,000 Units by mouth 2 (two) times daily.     denosumab (XGEVA) 120 MG/1.7ML SOLN injection Inject 120 mg into the skin every 30 (thirty) days.      dexamethasone (DECADRON) 4  MG tablet Take 5 pills before each treatment (Patient taking differently: Take 5 pills weekly.) 40 tablet 5   folic acid (FOLVITE) 1 MG tablet TAKE 1 TABLET BY MOUTH DAILY 90 tablet 2   gabapentin (NEURONTIN) 300 MG capsule Take 1 capsule (300 mg total) by mouth 2 (two) times daily. 180 capsule 2   hydrALAZINE (APRESOLINE) 50 MG tablet Take 1 tablet (50 mg total) by mouth every 8 (eight) hours. 90 tablet 0   loratadine (CLARITIN) 10 MG tablet Take 1 tablet (10 mg total) by mouth daily. 90 tablet 1   metoprolol succinate (TOPROL-XL) 100 MG 24 hr tablet Take 100 mg by mouth daily.  Multiple Vitamin (MULTIVITAMIN WITH MINERALS) TABS tablet Take 1 tablet by mouth daily.     pantoprazole (PROTONIX) 40 MG tablet Take 40 mg by mouth every other day.      pomalidomide (POMALYST) 3 MG capsule Take 1 capsule (3 mg total) by mouth daily. 21 capsule 0   tamoxifen (NOLVADEX) 20 MG tablet TAKE 1 TABLET (20 MG TOTAL) BY MOUTH DAILY. 90 tablet 3   vitamin B-12 (CYANOCOBALAMIN) 1000 MCG tablet Take 1,000 mcg by mouth daily.     ALPRAZolam (XANAX) 0.5 MG tablet TAKE 1 TABLET BY MOUTH AT BEDTIME AS NEEDED FOR ANXIETY OR SLEEP (Patient not taking: Reported on 06/11/2020) 30 tablet 3   augmented betamethasone dipropionate (DIPROLENE-AF) 0.05 % cream Apply topically as needed. (Patient not taking: Reported on 06/11/2020) 30 g 6   diclofenac Sodium (VOLTAREN) 1 % GEL Use 1 to 2 times per day as directed by doctor (Patient not taking: Reported on 06/11/2020)     ondansetron (ZOFRAN) 8 MG tablet TAKE ONE TABLET BY MOUTH EVERY 8 HOURS AS NEEDED (Patient not taking: Reported on 06/11/2020) 30 tablet 2   polyethylene glycol (MIRALAX / GLYCOLAX) packet Take 17 g by mouth daily as needed for mild constipation. (Patient not taking: Reported on 06/11/2020) 14 each 0   prochlorperazine (COMPAZINE) 10 MG tablet Take 1 tablet (10 mg total) by mouth every 6 (six) hours as needed for nausea or vomiting. (Patient not  taking: Reported on 06/11/2020) 30 tablet 2   pseudoephedrine-guaifenesin (MUCINEX D) 60-600 MG 12 hr tablet Take 1 tablet by mouth 2 (two) times daily as needed for congestion.  (Patient not taking: Reported on 06/11/2020)     No current facility-administered medications for this visit.    ALLERGIES:  Allergies  Allergen Reactions   Cefepime     Suspected severe thrombocytopenia is a result of cefepime induced antigen platelet destruction    PHYSICAL EXAM:  Performance status (ECOG): 1 - Symptomatic but completely ambulatory  Vitals:   06/11/20 1005  BP: 117/76  Pulse: 74  Resp: 18  Temp: 98 F (36.7 C)  SpO2: 99%   Wt Readings from Last 3 Encounters:  06/11/20 206 lb 8 oz (93.7 kg)  05/28/20 215 lb 6.4 oz (97.7 kg)  05/20/20 212 lb 4.8 oz (96.3 kg)   Physical Exam Vitals reviewed.  Constitutional:      Appearance: Normal appearance.  Cardiovascular:     Rate and Rhythm: Normal rate and regular rhythm.     Pulses: Normal pulses.     Heart sounds: Normal heart sounds.  Pulmonary:     Effort: Pulmonary effort is normal.     Breath sounds: Normal breath sounds.  Abdominal:     Palpations: Abdomen is soft. There is no hepatomegaly, splenomegaly or mass.     Tenderness: There is no abdominal tenderness. There is no right CVA tenderness or left CVA tenderness.  Musculoskeletal:     Right hip: Normal strength.     Left hip: Normal strength.     Right lower leg: No edema.     Left lower leg: No edema.  Neurological:     General: No focal deficit present.     Mental Status: He is alert and oriented to person, place, and time.  Psychiatric:        Mood and Affect: Mood normal.        Behavior: Behavior normal.     LABORATORY DATA:  I have reviewed the labs as listed.  CBC  Latest Ref Rng & Units 06/11/2020 05/28/2020 05/20/2020  WBC 4.0 - 10.5 K/uL 6.1 9.3 4.7  Hemoglobin 13.0 - 17.0 g/dL 11.6(L) 10.6(L) 10.0(L)  Hematocrit 39 - 52 % 34.6(L) 32.3(L) 31.2(L)    Platelets 150 - 400 K/uL 148(L) 188 215   CMP Latest Ref Rng & Units 06/11/2020 06/03/2020 05/28/2020  Glucose 70 - 99 mg/dL 115(H) 106(H) 124(H)  BUN 8 - 23 mg/dL 16 23 19   Creatinine 0.61 - 1.24 mg/dL 1.65(H) 1.82(H) 1.63(H)  Sodium 135 - 145 mmol/L 137 135 136  Potassium 3.5 - 5.1 mmol/L 4.1 4.0 4.4  Chloride 98 - 111 mmol/L 101 103 101  CO2 22 - 32 mmol/L 25 23 26   Calcium 8.9 - 10.3 mg/dL 9.3 9.2 10.2  Total Protein 6.5 - 8.1 g/dL 6.2(L) 6.2(L) 6.3(L)  Total Bilirubin 0.3 - 1.2 mg/dL 0.5 0.3 0.4  Alkaline Phos 38 - 126 U/L 25(L) 24(L) 25(L)  AST 15 - 41 U/L 16 16 19   ALT 0 - 44 U/L 20 18 15     DIAGNOSTIC IMAGING:  I have independently reviewed the scans and discussed with the patient. No results found.   ASSESSMENT:  1.  Relapsed multiple myeloma: -Autologous stem cell transplant on 10/08/2017 -Post transplant consolidation with CyCarD for 2 cycles with M spike undetectable. -Maintenance therapy with carfilzomib 70 mg per metered square days 1 and 15 every 28 days from 04/20/2018, dose reduced to 35 mg per metered square on 01/05/2019, titrated up to 56 mg per metered square on 01/09/2020. 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 -PET scan on 02/18/2020 showed new left supraclavicular lymph node at the thoracic inlet, SUV 14.2.  Multifocal osseous lesions largely improved, though residual lesions noted including right acromion, left acromion, thyroid cartilage, right sternum.  Additional lesions throughout the sternum, bilateral ribs, thoracolumbar spine, bilateral pelvis have resolved. -BMBX on 02/14/2020 shows plasma cell myeloma, 60-70% of cells.  FISH for myeloma was negative.  Cytogenetics was normal. -4 cycles of daratumumab, bortezomib and dexamethasone from 02/28/2020 through 04/29/2020. -Daratumumab, pomalidomide and dexamethasone started on 05/28/2020.  He is receiving daratumumab every 2 weeks, Pomalyst 3 mg days 1-21, dexamethasone 20  mg weekly.   PLAN:  1.  Relapsed IgG kappa multiple myeloma: -He started taking pomalidomide 3 mg on 05/26/2020. -He reported severe weakness.  He lost 6 pounds.  He is not eating well.  He also reported tingling and numbness along with pins-and-needles sensation in the legs extending to the lower back.  This started about 3 to 4 weeks ago. -Overall he is feeling weak since he started pomalidomide.  I have told him to stop taking pomalidomide. -I have reviewed labs from today.  CBC is grossly within normal limits.  LFTs are normal.  Creatinine is 1.65. -We will proceed with his daratumumab today. -I plan to cut back on pomalidomide to 2 mg 3 weeks on 1 week off.  We will send a new prescription.  He will start next pomalidomide in 2 weeks. -He will receive 500 mL of normal saline over 1 hour.  I plan to repeat myeloma panel in 2 weeks.  2.  Myeloma bone disease: -Continue monthly denosumab.  3.  Macrocytic anemia: -Continue Procrit for hemoglobin less than 9 as needed.  4.  ID prophylaxis: -Continue acyclovir twice daily.  5.  Peripheral neuropathy: -Continue gabapentin 300 mg twice daily.  6.  History of breast cancer: -Continue tamoxifen daily.  Diagnosed in 2011.   Orders  placed this encounter:  No orders of the defined types were placed in this encounter.    Derek Jack, MD [place] [phone]   I, Milinda Antis, am acting as a scribe for Dr. Sanda Linger.  I, Derek Jack MD, have reviewed the above documentation for accuracy and completeness, and I agree with the above.

## 2020-06-11 NOTE — Progress Notes (Signed)
Patient has been assessed, vital signs and labs have been reviewed by Dr. Katragadda. ANC, Creatinine, LFTs, and Platelets are within treatment parameters per Dr. Katragadda. The patient is good to proceed with treatment at this time.  

## 2020-06-12 LAB — PROTEIN ELECTROPHORESIS, SERUM
A/G Ratio: 1.9 — ABNORMAL HIGH (ref 0.7–1.7)
Albumin ELP: 3.7 g/dL (ref 2.9–4.4)
Alpha-1-Globulin: 0.3 g/dL (ref 0.0–0.4)
Alpha-2-Globulin: 0.6 g/dL (ref 0.4–1.0)
Beta Globulin: 0.7 g/dL (ref 0.7–1.3)
Gamma Globulin: 0.3 g/dL — ABNORMAL LOW (ref 0.4–1.8)
Globulin, Total: 2 g/dL — ABNORMAL LOW (ref 2.2–3.9)
M-Spike, %: 0.2 g/dL — ABNORMAL HIGH
Total Protein ELP: 5.7 g/dL — ABNORMAL LOW (ref 6.0–8.5)

## 2020-06-12 LAB — KAPPA/LAMBDA LIGHT CHAINS
Kappa free light chain: 157.2 mg/L — ABNORMAL HIGH (ref 3.3–19.4)
Kappa, lambda light chain ratio: 52.4 — ABNORMAL HIGH (ref 0.26–1.65)
Lambda free light chains: 3 mg/L — ABNORMAL LOW (ref 5.7–26.3)

## 2020-06-13 ENCOUNTER — Ambulatory Visit (HOSPITAL_COMMUNITY): Payer: Medicare Other

## 2020-06-17 ENCOUNTER — Ambulatory Visit (HOSPITAL_COMMUNITY): Payer: Medicare Other

## 2020-06-17 ENCOUNTER — Other Ambulatory Visit (HOSPITAL_COMMUNITY): Payer: Medicare Other

## 2020-06-20 ENCOUNTER — Ambulatory Visit (HOSPITAL_COMMUNITY): Payer: Medicare Other

## 2020-06-24 ENCOUNTER — Other Ambulatory Visit: Payer: Self-pay

## 2020-06-24 ENCOUNTER — Encounter (HOSPITAL_COMMUNITY): Payer: Self-pay

## 2020-06-24 ENCOUNTER — Inpatient Hospital Stay (HOSPITAL_COMMUNITY): Payer: Medicare Other

## 2020-06-24 ENCOUNTER — Encounter (HOSPITAL_COMMUNITY): Payer: Self-pay | Admitting: Nurse Practitioner

## 2020-06-24 ENCOUNTER — Inpatient Hospital Stay (HOSPITAL_BASED_OUTPATIENT_CLINIC_OR_DEPARTMENT_OTHER): Payer: Medicare Other | Admitting: Nurse Practitioner

## 2020-06-24 DIAGNOSIS — R531 Weakness: Secondary | ICD-10-CM | POA: Diagnosis not present

## 2020-06-24 DIAGNOSIS — Z5112 Encounter for antineoplastic immunotherapy: Secondary | ICD-10-CM | POA: Diagnosis not present

## 2020-06-24 DIAGNOSIS — C9000 Multiple myeloma not having achieved remission: Secondary | ICD-10-CM | POA: Diagnosis not present

## 2020-06-24 DIAGNOSIS — G629 Polyneuropathy, unspecified: Secondary | ICD-10-CM | POA: Diagnosis not present

## 2020-06-24 DIAGNOSIS — D539 Nutritional anemia, unspecified: Secondary | ICD-10-CM | POA: Diagnosis not present

## 2020-06-24 DIAGNOSIS — Z9221 Personal history of antineoplastic chemotherapy: Secondary | ICD-10-CM | POA: Diagnosis not present

## 2020-06-24 LAB — CBC WITH DIFFERENTIAL/PLATELET
Abs Immature Granulocytes: 0.02 10*3/uL (ref 0.00–0.07)
Basophils Absolute: 0.1 10*3/uL (ref 0.0–0.1)
Basophils Relative: 2 %
Eosinophils Absolute: 0.2 10*3/uL (ref 0.0–0.5)
Eosinophils Relative: 4 %
HCT: 33.4 % — ABNORMAL LOW (ref 39.0–52.0)
Hemoglobin: 11 g/dL — ABNORMAL LOW (ref 13.0–17.0)
Immature Granulocytes: 0 %
Lymphocytes Relative: 22 %
Lymphs Abs: 1.3 10*3/uL (ref 0.7–4.0)
MCH: 34.7 pg — ABNORMAL HIGH (ref 26.0–34.0)
MCHC: 32.9 g/dL (ref 30.0–36.0)
MCV: 105.4 fL — ABNORMAL HIGH (ref 80.0–100.0)
Monocytes Absolute: 0.5 10*3/uL (ref 0.1–1.0)
Monocytes Relative: 9 %
Neutro Abs: 3.8 10*3/uL (ref 1.7–7.7)
Neutrophils Relative %: 63 %
Platelets: 224 10*3/uL (ref 150–400)
RBC: 3.17 MIL/uL — ABNORMAL LOW (ref 4.22–5.81)
RDW: 14.4 % (ref 11.5–15.5)
WBC: 5.9 10*3/uL (ref 4.0–10.5)
nRBC: 0 % (ref 0.0–0.2)

## 2020-06-24 LAB — COMPREHENSIVE METABOLIC PANEL
ALT: 17 U/L (ref 0–44)
AST: 15 U/L (ref 15–41)
Albumin: 3.8 g/dL (ref 3.5–5.0)
Alkaline Phosphatase: 26 U/L — ABNORMAL LOW (ref 38–126)
Anion gap: 9 (ref 5–15)
BUN: 16 mg/dL (ref 8–23)
CO2: 28 mmol/L (ref 22–32)
Calcium: 9.6 mg/dL (ref 8.9–10.3)
Chloride: 103 mmol/L (ref 98–111)
Creatinine, Ser: 1.8 mg/dL — ABNORMAL HIGH (ref 0.61–1.24)
GFR calc Af Amer: 43 mL/min — ABNORMAL LOW (ref >60)
GFR calc non Af Amer: 37 mL/min — ABNORMAL LOW (ref >60)
Glucose, Bld: 109 mg/dL — ABNORMAL HIGH (ref 70–99)
Potassium: 4 mmol/L (ref 3.5–5.1)
Sodium: 140 mmol/L (ref 135–145)
Total Bilirubin: 0.4 mg/dL (ref 0.3–1.2)
Total Protein: 5.8 g/dL — ABNORMAL LOW (ref 6.5–8.1)

## 2020-06-24 LAB — MAGNESIUM: Magnesium: 2 mg/dL (ref 1.7–2.4)

## 2020-06-24 LAB — LACTATE DEHYDROGENASE: LDH: 130 U/L (ref 98–192)

## 2020-06-24 MED ORDER — GABAPENTIN 300 MG PO CAPS: 300.0000 mg | ORAL_CAPSULE | Freq: Three times a day (TID) | ORAL | 2 refills | Status: DC

## 2020-06-24 MED ORDER — DIPHENHYDRAMINE HCL 25 MG PO CAPS: 50.0000 mg | ORAL_CAPSULE | Freq: Once | ORAL | Status: DC

## 2020-06-24 MED ORDER — DEXAMETHASONE 4 MG PO TABS: 20.0000 mg | ORAL_TABLET | Freq: Once | ORAL | Status: DC

## 2020-06-24 MED ORDER — ACETAMINOPHEN 325 MG PO TABS: 650.0000 mg | ORAL_TABLET | Freq: Once | ORAL | Status: DC

## 2020-06-24 MED ORDER — DARATUMUMAB-HYALURONIDASE-FIHJ 1800-30000 MG-UT/15ML ~~LOC~~ SOLN
1800.0000 mg | Freq: Once | SUBCUTANEOUS | Status: AC
  Administered 2020-06-24: 1800 mg via SUBCUTANEOUS
  Filled 2020-06-24: qty 15

## 2020-06-24 NOTE — Patient Instructions (Signed)
[facility] Discharge Instructions for Patients Receiving Chemotherapy  Today you received the following chemotherapy agents   To help prevent nausea and vomiting after your treatment, we encourage you to take your nausea medication   If you develop nausea and vomiting that is not controlled by your nausea medication, call the clinic.   BELOW ARE SYMPTOMS THAT SHOULD BE REPORTED IMMEDIATELY:  *FEVER GREATER THAN 100.5 F  *CHILLS WITH OR WITHOUT FEVER  NAUSEA AND VOMITING THAT IS NOT CONTROLLED WITH YOUR NAUSEA MEDICATION  *UNUSUAL SHORTNESS OF BREATH  *UNUSUAL BRUISING OR BLEEDING  TENDERNESS IN MOUTH AND THROAT WITH OR WITHOUT PRESENCE OF ULCERS  *URINARY PROBLEMS  *BOWEL PROBLEMS  UNUSUAL RASH Items with * indicate a potential emergency and should be followed up as soon as possible.  Feel free to call the clinic should you have any questions or concerns. The clinic phone number is [phone].  Please show the CHEMO ALERT CARD at check-in to the Emergency Department and triage nurse.   

## 2020-06-24 NOTE — Assessment & Plan Note (Addendum)
1.  Relapsed IgG kappa multiple myeloma: -Autologous stem cell transplant on 10/08/2017. -Post transplant consolidation with CyCarD for 2 cycles with M spike undetectable. -Maintenance therapy with carfilzomib 70 mg/m days 1 and 15 every 28 days from 04/20/2018, dose reduced to 35 mg/m on 01/05/2019, titrated up to 56 mg/m on 01/09/2020. 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 -PET scan on 02/18/2020 showed new left supraclavicular lymph node at the thoracic inlet, SUV 14.2.  Multifocal osseous lesions largely improved, though residual lesions noted including right acromion, left acromion, thyroid cartilage, right sternum.  Additional lesions throughout the sternum, bilateral ribs, thoracolumbar spine, bilateral pelvis have resolved. -BM BX on 02/14/2020 showed plasma cell myeloma, 60 to 70% of cells.  FISH for myeloma was negative.  Cytogenetics was normal. -4 cycles of daratumumab, bortezomib and dexamethasone from 02/28/2020 through 04/29/2020. --Tuesday Mab, pomalidomide and dexamethasone started on 05/28/2020.  He is receiving daratumumab every 2 weeks, Pomalyst 3 mg days 1 through 21, dexamethasone 20 mg weekly. -He started taking pomalidomide 3 mg on 05/26/2020. -He reported severe weakness.  He lost 6 pounds.  He is not eating well.  He also reports tingling and numbness along with pins and needle sensation in the legs extending to the lower back. -He was told to stop taking pomalidomide for 2 weeks. -He will start his pomalidomide back today on 06/24/2020 at 2 mg. -Labs from today 06/24/2020 showed hemoglobin 11.0, creatinine 1.80, LFTs normal.  Myeloma labs pending. -She will proceed with his daratumumab today.  2.  Myeloma bone disease: -Continue monthly denosumab.  3.  Macrocytic anemia: -Continue Procrit for hemoglobin less than 9 as needed.  4.  ID prophylaxis: -Continue acyclovir twice daily.  5.  Peripheral neuropathy: -He is having  neuropathy pains increased during the day. -We will increase his gabapentin 300 mg to 3 times a day.  6.  History of breast cancer: -Diagnosed in 2011. -Continue tamoxifen daily. 

## 2020-06-24 NOTE — Progress Notes (Signed)
Johnny Lucas, [place] [address]   CLINIC:  Medical Oncology/Hematology  PCP:  Johnny Percy, MD [place] [address] [phone]   REASON FOR VISIT: Follow-up for multiple myeloma   BRIEF ONCOLOGIC HISTORY:  Oncology History  Breast cancer, male (Johnny Lucas)  12/31/2009 Initial Biopsy   Biopsy of L breast    12/31/2009 Pathology Results   Invasive ductal carcinoma, ER/PR+, HER 2 negative   12/31/2009 Imaging   Ultrasound showing a 2.43 x 1.85 x 3 cm hypoechoic spiculated mass in the 12 o clock L breast retroareolar region   01/01/2010 -  Anti-estrogen oral therapy   Tamoxifen 20 mg daily   01/06/2010 Imaging   Bone scan abnormal uptake in the diaphysis of the R humerus, abnormal in the R third, fifth and sixth ribs, lesion also noted in the sternum.   02/03/2010 Surgery   Rod placement and fixation of R humerus by Dr. Amedeo Lucas   02/05/2010 - 02/18/2010 Radiation Therapy   30Gy in 10 fractions of 3 Gy per fraction to R pathologic fracture   03/11/2010 -  Chemotherapy   Denosumab monthly, now every 3 months. Started at [facility]    06/09/2016 Imaging   Three hypermetabolic osseous lesions in the sternum, left ilium and right ilium, as discussed above, likely represent osseous metastases. At this time, these are not recognizable on the CT images. 2. No extra skeletal metastatic disease identified in the neck, chest, abdomen or pelvis.   10/13/2016 Progression   PET shows various new and enlarging osseous metastatic lesions with no definite extra osseous metastatic disease currently identified.    12/31/2016 Progression   1. Multifocal hypermetabolic osseous metastases throughout the axial and proximal appendicular skeleton, which are increased in size, number and metabolism since 10/13/2016 PET-CT. 2. 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 3. No additional sites of hypermetabolic metastatic disease. 4. Chronic right mastoid sinusitis. 5. Aortic atherosclerosis.  One vessel coronary atherosclerosis.   Multiple myeloma ([place])  02/12/2017 Bone Marrow Biopsy   The marrow was variably cellular with large peritrabecular aggregates of kappa restricted plasma cells (66% by aspirate, 30% by Cd138). Cytogenetics +11.    03/01/2017 - 06/29/2017 Chemotherapy   RVD    05/26/2017 Bone Marrow Biopsy   Performed at [facility]:  Plasma cell myeloma in a 30% cellularmarrow with decreased trilineage hematopoiesis and 42% kappalight chain restricted plasma cells on the aspirate smears andlarge aggregates on the core biopsy.    07/12/2017 - 09/01/2017 Chemotherapy   3 cycles of carfizolmib/cyclophosphamide/dexamethasone     10/08/2017 Bone Marrow Transplant   Autotransplant at [facility]   02/28/2020 -  Chemotherapy   The patient had dexamethasone (DECADRON) tablet 20 mg, 20 mg, Oral,  Once, 2 of 2 cycles dexamethasone (DECADRON) tablet 20 mg, 20 mg, Oral, Once, 7 of 11 cycles Administration: 20 mg (03/25/2020) daratumumab-hyaluronidase-fihj (DARZALEX FASPRO) 1800-30000 MG-UT/15ML chemo SQ injection 1,800 mg, 1,800 mg, Subcutaneous,  Once, 7 of 11 cycles Administration: 1,800 mg (02/28/2020), 1,800 mg (03/05/2020), 1,800 mg (03/12/2020), 1,800 mg (03/18/2020), 1,800 mg (03/25/2020), 1,800 mg (04/01/2020), 1,800 mg (04/29/2020), 1,800 mg (04/08/2020), 1,800 mg (04/15/2020), 1,800 mg (04/22/2020), 1,800 mg (05/28/2020), 1,800 mg (06/11/2020) bortezomib SQ (VELCADE) chemo injection 2.75 mg, 1.3 mg/m2 = 2.75 mg, Subcutaneous,  Once, 4 of 4 cycles Administration: 2.75 mg (02/28/2020), 2.75 mg (03/05/2020), 2.75 mg (  03/08/2020), 2.75 mg (03/18/2020), 2.75 mg (03/21/2020), 2.75 mg (03/25/2020), 2.75 mg (03/28/2020), 2.75 mg (04/29/2020), 2.75 mg (05/02/2020), 2.75 mg (05/06/2020), 2.75 mg (05/09/2020), 2.75 mg (04/08/2020), 2.75 mg  (04/11/2020), 2.75 mg (04/15/2020), 2.75 mg (04/18/2020)  for chemotherapy treatment.    Multiple myeloma not having achieved remission ([name]  06/29/2017 Initial Diagnosis   Multiple myeloma not having achieved remission ([name]   02/28/2020 -  Chemotherapy   The patient had dexamethasone (DECADRON) tablet 20 mg, 20 mg, Oral,  Once, 2 of 2 cycles dexamethasone (DECADRON) tablet 20 mg, 20 mg, Oral, Once, 7 of 11 cycles Administration: 20 mg (03/25/2020) daratumumab-hyaluronidase-fihj (DARZALEX FASPRO) 1800-30000 MG-UT/15ML chemo SQ injection 1,800 mg, 1,800 mg, Subcutaneous,  Once, 7 of 11 cycles Administration: 1,800 mg (02/28/2020), 1,800 mg (03/05/2020), 1,800 mg (03/12/2020), 1,800 mg (03/18/2020), 1,800 mg (03/25/2020), 1,800 mg (04/01/2020), 1,800 mg (04/29/2020), 1,800 mg (04/08/2020), 1,800 mg (04/15/2020), 1,800 mg (04/22/2020), 1,800 mg (05/28/2020), 1,800 mg (06/11/2020) bortezomib SQ (VELCADE) chemo injection 2.75 mg, 1.3 mg/m2 = 2.75 mg, Subcutaneous,  Once, 4 of 4 cycles Administration: 2.75 mg (02/28/2020), 2.75 mg (03/05/2020), 2.75 mg (03/08/2020), 2.75 mg (03/18/2020), 2.75 mg (03/21/2020), 2.75 mg (03/25/2020), 2.75 mg (03/28/2020), 2.75 mg (04/29/2020), 2.75 mg (05/02/2020), 2.75 mg (05/06/2020), 2.75 mg (05/09/2020), 2.75 mg (04/08/2020), 2.75 mg (04/11/2020), 2.75 mg (04/15/2020), 2.75 mg (04/18/2020)  for chemotherapy treatment.      CANCER STAGING: Cancer Staging Multiple myeloma ([name] Staging form: Plasma Cell Myeloma and Plasma Cell Disorders, AJCC 8th Edition - Clinical stage from 05/03/2017: Beta-2-microglobulin (mg/L): 3.5, Albumin (g/dL): 3.4, ISS: Stage II, High-risk cytogenetics: Absent, LDH: Not assessed - Signed by Johnny Newer, PA-C on 05/03/2017    INTERVAL HISTORY:  Johnny Lucas 74 y.o. male returns for routine follow-up for multiple myeloma.  Patient reports he feels a lot better since he has been off the pomalidomide for 2 weeks.  He reports his energy is back to normal.  He does report he is still  having increased neuropathy pains in his legs and hands. Denies any nausea, vomiting, or diarrhea. Denies any new pains. Had not noticed any recent bleeding such as epistaxis, hematuria or hematochezia. Denies recent chest pain on exertion, shortness of breath on minimal exertion, pre-syncopal episodes, or palpitations. Denies any recent fevers, infections, or recent hospitalizations. Patient reports appetite at 75% and energy level at 75%.  He is eating well maintain his weight this time.    REVIEW OF SYSTEMS:  Review of Systems  Neurological: Positive for numbness.  All other systems reviewed and are negative.    PAST MEDICAL/SURGICAL HISTORY:  Past Medical History:  Diagnosis Date  . Anxiety   . Bone metastases ([name] 09/10/2016  . Breast cancer ([name] 2011   Stave IV breast cancer; radiation and tamoxifen  . Breast cancer, male ([name]    Stave IV breast cancer; radiation and tamoxifen  Overview:  Left breast ca with mets bone  Overview:  METS TO BONE  . GERD (gastroesophageal reflux disease)   . Hypertension   . Macular degeneration   . Multiple myeloma ([name] 02/18/2017  . Peripheral neuropathy    Past Surgical History:  Procedure Laterality Date  . BACK SURGERY    . CATARACT EXTRACTION W/PHACO Left 02/03/2019   Procedure: CATARACT EXTRACTION PHACO AND INTRAOCULAR LENS PLACEMENT (IOC);  Surgeon: Fabio Pierce, MD;  Location: [facility];  Service: Ophthalmology;  Laterality: Left;  CDE: 15.89  . HERNIA REPAIR    . PORTACATH PLACEMENT Left 06/10/2018   Procedure: INSERTION PORT-A-CATH;  Surgeon: Virl Cagey, MD;  Location: [facility];  Service: General;  Laterality: Left;  . right arm surgery       SOCIAL HISTORY:  Social History   Socioeconomic History  . Marital status: Married    Spouse name: Not on file  . Number of children: 3  . Years of education: Not on file  . Highest education level: Not on file  Occupational History  . Not on file  Tobacco Use  . Smoking status:  Never Smoker  . Smokeless tobacco: Former Network engineer and Sexual Activity  . Alcohol use: Yes    Alcohol/week: 24.0 standard drinks    Types: 24 Cans of beer per week  . Drug use: No  . Sexual activity: Not on file  Other Topics Concern  . Not on file  Social History Narrative  . Not on file   Social Determinants of Health   Financial Resource Strain:   . Difficulty of Paying Living Expenses:   Food Insecurity:   . Worried About Charity fundraiser in the Last Year:   . Arboriculturist in the Last Year:   Transportation Needs:   . Film/video editor (Medical):   Marland Kitchen Lack of Transportation (Non-Medical):   Physical Activity:   . Days of Exercise per Week:   . Minutes of Exercise per Session:   Stress:   . Feeling of Stress :   Social Connections:   . Frequency of Communication with Friends and Family:   . Frequency of Social Gatherings with Friends and Family:   . Attends Religious Services:   . Active Member of Clubs or Organizations:   . Attends Archivist Meetings:   Marland Kitchen Marital Status:   Intimate Partner Violence:   . Fear of Current or Ex-Partner:   . Emotionally Abused:   Marland Kitchen Physically Abused:   . Sexually Abused:     FAMILY HISTORY:  Family History  Problem Relation Age of Onset  . Stroke Mother   . Cancer Maternal Aunt        cancer NOS; died in her 65s  . Lung cancer Maternal Uncle        smoker    CURRENT MEDICATIONS:  Outpatient Encounter Medications as of 06/24/2020  Medication Sig  . acetaminophen (TYLENOL) 500 MG tablet Take 500 mg by mouth every 6 (six) hours as needed.  Marland Kitchen acyclovir (ZOVIRAX) 800 MG tablet TAKE 1 TABLET TWICE DAILY  . ALPRAZolam (XANAX) 0.5 MG tablet TAKE 1 TABLET BY MOUTH AT BEDTIME AS NEEDED FOR ANXIETY OR SLEEP  . amLODipine (NORVASC) 5 MG tablet Take 1 tablet (5 mg total) by mouth daily.  Marland Kitchen aspirin EC 81 MG tablet Take 81 mg by mouth daily.  Marland Kitchen augmented betamethasone dipropionate (DIPROLENE-AF) 0.05 % cream  Apply topically as needed.  . calcium-vitamin D (OSCAL WITH D) 500-200 MG-UNIT TABS tablet TAKE TWO TABLETS BY MOUTH DAILY  . Cholecalciferol (VITAMIN D) 50 MCG (2000 UT) CAPS Take 4,000 Units by mouth 2 (two) times daily.  Marland Kitchen DARATUMUMAB-HYALURONIDASE-FIHJ [place] Inject 1,800 mg into the skin every 14 (fourteen) days.   Marland Kitchen denosumab (XGEVA) 120 MG/1.7ML SOLN injection Inject 120 mg into the skin every 30 (thirty) days.   Marland Kitchen dexamethasone (DECADRON) 4 MG tablet Take 5 pills before each treatment (Patient taking differently: Take 5 pills weekly.)  . folic acid (FOLVITE) 1 MG tablet TAKE 1 TABLET BY MOUTH DAILY  . gabapentin (NEURONTIN) 300 MG capsule Take 1  capsule (300 mg total) by mouth 2 (two) times daily.  . hydrALAZINE (APRESOLINE) 50 MG tablet Take 1 tablet (50 mg total) by mouth every 8 (eight) hours.  Marland Kitchen loratadine (CLARITIN) 10 MG tablet Take 1 tablet (10 mg total) by mouth daily.  . metoprolol succinate (TOPROL-XL) 100 MG 24 hr tablet Take 100 mg by mouth daily.   . Multiple Vitamin (MULTIVITAMIN WITH MINERALS) TABS tablet Take 1 tablet by mouth daily.  . ondansetron (ZOFRAN) 8 MG tablet TAKE ONE TABLET BY MOUTH EVERY 8 HOURS AS NEEDED  . pantoprazole (PROTONIX) 40 MG tablet Take 40 mg by mouth every other day.   . pomalidomide (POMALYST) 2 MG capsule Take 1 capsule (2 mg total) by mouth daily.  . prochlorperazine (COMPAZINE) 10 MG tablet Take 1 tablet (10 mg total) by mouth every 6 (six) hours as needed for nausea or vomiting.  . tamoxifen (NOLVADEX) 20 MG tablet TAKE 1 TABLET (20 MG TOTAL) BY MOUTH DAILY.  . vitamin B-12 (CYANOCOBALAMIN) 1000 MCG tablet Take 1,000 mcg by mouth daily.  . diclofenac Sodium (VOLTAREN) 1 % GEL Use 1 to 2 times per day as directed by doctor (Patient not taking: Reported on 06/11/2020)  . polyethylene glycol (MIRALAX / GLYCOLAX) packet Take 17 g by mouth daily as needed for mild constipation. (Patient not taking: Reported on 06/11/2020)  . pseudoephedrine-guaifenesin  (MUCINEX D) 60-600 MG 12 hr tablet Take 1 tablet by mouth 2 (two) times daily as needed for congestion.  (Patient not taking: Reported on 06/11/2020)  . [DISCONTINUED] ALPRAZolam (XANAX) 0.5 MG tablet TAKE 1 TABLET BY MOUTH AT BEDTIME AS NEEDED FOR ANXIETY OR SLEEP   No facility-administered encounter medications on file as of 06/24/2020.    ALLERGIES:  Allergies  Allergen Reactions  . Cefepime     Suspected severe thrombocytopenia is a result of cefepime induced antigen platelet destruction     PHYSICAL EXAM:  ECOG Performance status: 1  Vitals:   06/24/20 0829  BP: (!) 148/78  Pulse: 73  Resp: 18  Temp: 98.2 F (36.8 C)  SpO2: 98%   Filed Weights   06/24/20 0829  Weight: 207 lb 12.8 oz (94.3 kg)   Physical Exam Constitutional:      Appearance: Normal appearance. He is normal weight.  Cardiovascular:     Rate and Rhythm: Normal rate and regular rhythm.     Heart sounds: Normal heart sounds.  Pulmonary:     Effort: Pulmonary effort is normal.     Breath sounds: Normal breath sounds.  Abdominal:     General: Bowel sounds are normal.     Palpations: Abdomen is soft.  Musculoskeletal:        General: Normal range of motion.  Skin:    General: Skin is warm.  Neurological:     Mental Status: He is alert and oriented to person, place, and time. Mental status is at baseline.  Psychiatric:        Mood and Affect: Mood normal.        Behavior: Behavior normal.        Thought Content: Thought content normal.        Judgment: Judgment normal.      LABORATORY DATA:  I have reviewed the labs as listed.  CBC    Component Value Date/Time   WBC 5.9 06/24/2020 0816   RBC 3.17 (L) 06/24/2020 0816   HGB 11.0 (L) 06/24/2020 0816   HCT 33.4 (L) 06/24/2020 0816   PLT 224 06/24/2020  0816   MCV 105.4 (H) 06/24/2020 0816   MCH 34.7 (H) 06/24/2020 0816   MCHC 32.9 06/24/2020 0816   RDW 14.4 06/24/2020 0816   LYMPHSABS 1.3 06/24/2020 0816   MONOABS 0.5 06/24/2020 0816    EOSABS 0.2 06/24/2020 0816   BASOSABS 0.1 06/24/2020 0816   CMP Latest Ref Rng & Units 06/24/2020 06/11/2020 06/03/2020  Glucose 70 - 99 mg/dL 109(H) 115(H) 106(H)  BUN 8 - 23 mg/dL '16 16 23  '$ Creatinine 0.61 - 1.24 mg/dL 1.80(H) 1.65(H) 1.82(H)  Sodium 135 - 145 mmol/L 140 137 135  Potassium 3.5 - 5.1 mmol/L 4.0 4.1 4.0  Chloride 98 - 111 mmol/L 103 101 103  CO2 22 - 32 mmol/L '28 25 23  '$ Calcium 8.9 - 10.3 mg/dL 9.6 9.3 9.2  Total Protein 6.5 - 8.1 g/dL 5.8(L) 6.2(L) 6.2(L)  Total Bilirubin 0.3 - 1.2 mg/dL 0.4 0.5 0.3  Alkaline Phos 38 - 126 U/L 26(L) 25(L) 24(L)  AST 15 - 41 U/L '15 16 16  '$ ALT 0 - 44 U/L '17 20 18    '$ All questions were answered to patient's stated satisfaction. Encouraged patient to call with any new concerns or questions before his next visit to the cancer center and we can certain see him sooner, if needed.     ASSESSMENT & PLAN:  Multiple myeloma ([place]) 1.  Relapsed IgG kappa multiple myeloma: -Autologous stem cell transplant on 10/08/2017. -Post transplant consolidation with CyCarD for 2 cycles with M spike undetectable. -Maintenance therapy with carfilzomib 70 mg/m days 1 and 15 every 28 days from 04/20/2018, dose reduced to 35 mg/m on 01/05/2019, titrated up to 56 mg/m on 01/09/2020. 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 -PET scan on 02/18/2020 showed new left supraclavicular lymph node at the thoracic inlet, SUV 14.2.  Multifocal osseous lesions largely improved, though residual lesions noted including right acromion, left acromion, thyroid cartilage, right sternum.  Additional lesions throughout the sternum, bilateral ribs, thoracolumbar spine, bilateral pelvis have resolved. -BM BX on 02/14/2020 showed plasma cell myeloma, 60 to 70% of cells.  FISH for myeloma was negative.  Cytogenetics was normal. -4 cycles of daratumumab, bortezomib and dexamethasone from 02/28/2020 through 04/29/2020. --Tuesday Mab, pomalidomide and  dexamethasone started on 05/28/2020.  He is receiving daratumumab every 2 weeks, Pomalyst 3 mg days 1 through 21, dexamethasone 20 mg weekly. -He started taking pomalidomide 3 mg on 05/26/2020. -He reported severe weakness.  He lost 6 pounds.  He is not eating well.  He also reports tingling and numbness along with pins and needle sensation in the legs extending to the lower back. -He was told to stop taking pomalidomide for 2 weeks. -He will start his pomalidomide back today on 06/24/2020 at 2 mg. -Labs from today 06/24/2020 showed hemoglobin 11.0, creatinine 1.80, LFTs normal.  Myeloma labs pending. -She will proceed with his daratumumab today.  2.  Myeloma bone disease: -Continue monthly denosumab.  3.  Macrocytic anemia: -Continue Procrit for hemoglobin less than 9 as needed.  4.  ID prophylaxis: -Continue acyclovir twice daily.  5.  Peripheral neuropathy: -He is having neuropathy pains increased during the day. -We will increase his gabapentin 300 mg to 3 times a day.  6.  History of breast cancer: -Diagnosed in 2011. -Continue tamoxifen daily.     Orders placed this encounter:  Orders Placed This Encounter  Procedures  . Lactate dehydrogenase  . Magnesium  . CBC with Differential/Platelet  . Comprehensive metabolic panel  Francene Finders, FNP-C [place] [phone]

## 2020-06-24 NOTE — Patient Instructions (Signed)
[facility] Discharge Instructions  Follow up in 3 weeks with labs    Thank you for choosing [facility] at [facility] to provide your oncology and hematology care.  To afford each patient quality time with our provider, please arrive at least 15 minutes before your scheduled appointment time.   If you have a lab appointment with the Cancer Center please come in thru the Main Entrance and check in at the main information desk.  You need to re-schedule your appointment should you arrive 10 or more minutes late.  We strive to give you quality time with our providers, and arriving late affects you and other patients whose appointments are after yours.  Also, if you no show three or more times for appointments you may be dismissed from the clinic at the providers discretion.     Again, thank you for choosing [facility].  Our hope is that these requests will decrease the amount of time that you wait before being seen by our physicians.       _____________________________________________________________  Should you have questions after your visit to [facility], please contact our office at [phone] between the hours of 8:00 a.m. and 4:30 p.m.  Voicemails left after 4:00 p.m. will not be returned until the following business day.  For prescription refill requests, have your pharmacy contact our office and allow 72 hours.    Due to Covid, you will need to wear a mask upon entering the hospital. If you do not have a mask, a mask will be given to you at the Main Entrance upon arrival. For doctor visits, patients may have 1 support person with them. For treatment visits, patients can not have anyone with them due to social distancing guidelines and our immunocompromised population.      

## 2020-06-24 NOTE — Progress Notes (Signed)
Patient presents today for treatment and follow up visit with RLockamy NP. Vital signs are stable. Labs reviewed. Creatinine 1.80 today. Verbal order received to proceed with treatment. Patient took pre-medication at home at 0700am per patient's words. Tylenol 650mg s, Benadryl 50 mgs, Decadron 20 mgs.   Treatment given today per MD orders. Tolerated without adverse affects. Vital signs stable. No complaints at this time. Discharged from clinic ambulatory. F/U with [facility] as scheduled.

## 2020-06-25 LAB — KAPPA/LAMBDA LIGHT CHAINS
Kappa free light chain: 174.7 mg/L — ABNORMAL HIGH (ref 3.3–19.4)
Lambda free light chains: <1.5 mg/L — ABNORMAL LOW (ref 5.7–26.3)

## 2020-06-25 LAB — PROTEIN ELECTROPHORESIS, SERUM
A/G Ratio: 1.6 (ref 0.7–1.7)
Albumin ELP: 3.4 g/dL (ref 2.9–4.4)
Alpha-1-Globulin: 0.3 g/dL (ref 0.0–0.4)
Alpha-2-Globulin: 0.6 g/dL (ref 0.4–1.0)
Beta Globulin: 0.8 g/dL (ref 0.7–1.3)
Gamma Globulin: 0.4 g/dL (ref 0.4–1.8)
Globulin, Total: 2.1 g/dL — ABNORMAL LOW (ref 2.2–3.9)
M-Spike, %: 0.2 g/dL — ABNORMAL HIGH
Total Protein ELP: 5.5 g/dL — ABNORMAL LOW (ref 6.0–8.5)

## 2020-06-30 ENCOUNTER — Other Ambulatory Visit (HOSPITAL_COMMUNITY): Payer: Self-pay | Admitting: Hematology

## 2020-06-30 DIAGNOSIS — I10 Essential (primary) hypertension: Secondary | ICD-10-CM

## 2020-06-30 DIAGNOSIS — G4700 Insomnia, unspecified: Secondary | ICD-10-CM

## 2020-07-02 ENCOUNTER — Inpatient Hospital Stay (HOSPITAL_COMMUNITY): Payer: Medicare Other

## 2020-07-02 ENCOUNTER — Other Ambulatory Visit: Payer: Self-pay

## 2020-07-02 ENCOUNTER — Inpatient Hospital Stay (HOSPITAL_COMMUNITY): Payer: Medicare Other | Attending: Hematology

## 2020-07-02 VITALS — BP 115/65 | HR 74 | Temp 97.7°F | Resp 18 | Wt 210.6 lb

## 2020-07-02 DIAGNOSIS — Z5111 Encounter for antineoplastic chemotherapy: Secondary | ICD-10-CM | POA: Diagnosis not present

## 2020-07-02 DIAGNOSIS — Z79899 Other long term (current) drug therapy: Secondary | ICD-10-CM | POA: Insufficient documentation

## 2020-07-02 DIAGNOSIS — Z801 Family history of malignant neoplasm of trachea, bronchus and lung: Secondary | ICD-10-CM | POA: Insufficient documentation

## 2020-07-02 DIAGNOSIS — I10 Essential (primary) hypertension: Secondary | ICD-10-CM | POA: Insufficient documentation

## 2020-07-02 DIAGNOSIS — Z7982 Long term (current) use of aspirin: Secondary | ICD-10-CM | POA: Diagnosis not present

## 2020-07-02 DIAGNOSIS — Z9221 Personal history of antineoplastic chemotherapy: Secondary | ICD-10-CM | POA: Insufficient documentation

## 2020-07-02 DIAGNOSIS — D649 Anemia, unspecified: Secondary | ICD-10-CM | POA: Insufficient documentation

## 2020-07-02 DIAGNOSIS — K219 Gastro-esophageal reflux disease without esophagitis: Secondary | ICD-10-CM | POA: Diagnosis not present

## 2020-07-02 DIAGNOSIS — C9002 Multiple myeloma in relapse: Secondary | ICD-10-CM | POA: Diagnosis not present

## 2020-07-02 DIAGNOSIS — F419 Anxiety disorder, unspecified: Secondary | ICD-10-CM | POA: Insufficient documentation

## 2020-07-02 DIAGNOSIS — Z923 Personal history of irradiation: Secondary | ICD-10-CM | POA: Insufficient documentation

## 2020-07-02 DIAGNOSIS — C7951 Secondary malignant neoplasm of bone: Secondary | ICD-10-CM

## 2020-07-02 DIAGNOSIS — C50822 Malignant neoplasm of overlapping sites of left male breast: Secondary | ICD-10-CM | POA: Diagnosis not present

## 2020-07-02 DIAGNOSIS — Z170 Estrogen receptor positive status [ER+]: Secondary | ICD-10-CM | POA: Insufficient documentation

## 2020-07-02 DIAGNOSIS — G629 Polyneuropathy, unspecified: Secondary | ICD-10-CM | POA: Diagnosis not present

## 2020-07-02 DIAGNOSIS — C9000 Multiple myeloma not having achieved remission: Secondary | ICD-10-CM

## 2020-07-02 LAB — COMPREHENSIVE METABOLIC PANEL
ALT: 16 U/L (ref 0–44)
AST: 17 U/L (ref 15–41)
Albumin: 3.9 g/dL (ref 3.5–5.0)
Alkaline Phosphatase: 31 U/L — ABNORMAL LOW (ref 38–126)
Anion gap: 9 (ref 5–15)
BUN: 20 mg/dL (ref 8–23)
CO2: 26 mmol/L (ref 22–32)
Calcium: 9.2 mg/dL (ref 8.9–10.3)
Chloride: 103 mmol/L (ref 98–111)
Creatinine, Ser: 1.58 mg/dL — ABNORMAL HIGH (ref 0.61–1.24)
GFR calc Af Amer: 50 mL/min — ABNORMAL LOW (ref >60)
GFR calc non Af Amer: 43 mL/min — ABNORMAL LOW (ref >60)
Glucose, Bld: 131 mg/dL — ABNORMAL HIGH (ref 70–99)
Potassium: 4.3 mmol/L (ref 3.5–5.1)
Sodium: 138 mmol/L (ref 135–145)
Total Bilirubin: 0.4 mg/dL (ref 0.3–1.2)
Total Protein: 6 g/dL — ABNORMAL LOW (ref 6.5–8.1)

## 2020-07-02 MED ORDER — DENOSUMAB 120 MG/1.7ML ~~LOC~~ SOLN
120.0000 mg | Freq: Once | SUBCUTANEOUS | Status: AC
  Administered 2020-07-02: 120 mg via SUBCUTANEOUS

## 2020-07-02 MED ORDER — DENOSUMAB 120 MG/1.7ML ~~LOC~~ SOLN
SUBCUTANEOUS | Status: AC
  Filled 2020-07-02: qty 1.7

## 2020-07-02 NOTE — Patient Instructions (Signed)
[facility] at [facility]  Discharge Instructions:  Denosumab injection What is this medicine? DENOSUMAB (den oh sue mab) slows bone breakdown. Prolia is used to treat osteoporosis in women after menopause and in men, and in people who are taking corticosteroids for 6 months or more. Xgeva is used to treat a high calcium level due to cancer and to prevent bone fractures and other bone problems caused by multiple myeloma or cancer bone metastases. Xgeva is also used to treat giant cell tumor of the bone. This medicine may be used for other purposes; ask your health care provider or pharmacist if you have questions. COMMON BRAND NAME(S): Prolia, XGEVA What should I tell my health care provider before I take this medicine? They need to know if you have any of these conditions:  dental disease  having surgery or tooth extraction  infection  kidney disease  low levels of calcium or Vitamin D in the blood  malnutrition  on hemodialysis  skin conditions or sensitivity  thyroid or parathyroid disease  an unusual reaction to denosumab, other medicines, foods, dyes, or preservatives  pregnant or trying to get pregnant  breast-feeding How should I use this medicine? This medicine is for injection under the skin. It is given by a health care professional in a hospital or clinic setting. A special MedGuide will be given to you before each treatment. Be sure to read this information carefully each time. For Prolia, talk to your pediatrician regarding the use of this medicine in children. Special care may be needed. For Xgeva, talk to your pediatrician regarding the use of this medicine in children. While this drug may be prescribed for children as young as 13 years for selected conditions, precautions do apply. Overdosage: If you think you have taken too much of this medicine contact a poison control center or emergency room at once. NOTE: This medicine is only for  you. Do not share this medicine with others. What if I miss a dose? It is important not to miss your dose. Call your doctor or health care professional if you are unable to keep an appointment. What may interact with this medicine? Do not take this medicine with any of the following medications:  other medicines containing denosumab This medicine may also interact with the following medications:  medicines that lower your chance of fighting infection  steroid medicines like prednisone or cortisone This list may not describe all possible interactions. Give your health care provider a list of all the medicines, herbs, non-prescription drugs, or dietary supplements you use. Also tell them if you smoke, drink alcohol, or use illegal drugs. Some items may interact with your medicine. What should I watch for while using this medicine? Visit your doctor or health care professional for regular checks on your progress. Your doctor or health care professional may order blood tests and other tests to see how you are doing. Call your doctor or health care professional for advice if you get a fever, chills or sore throat, or other symptoms of a cold or flu. Do not treat yourself. This drug may decrease your body's ability to fight infection. Try to avoid being around people who are sick. You should make sure you get enough calcium and vitamin D while you are taking this medicine, unless your doctor tells you not to. Discuss the foods you eat and the vitamins you take with your health care professional. See your dentist regularly. Brush and floss your teeth as directed.   Before you have any dental work done, tell your dentist you are receiving this medicine. Do not become pregnant while taking this medicine or for 5 months after stopping it. Talk with your doctor or health care professional about your birth control options while taking this medicine. Women should inform their doctor if they wish to become  pregnant or think they might be pregnant. There is a potential for serious side effects to an unborn child. Talk to your health care professional or pharmacist for more information. What side effects may I notice from receiving this medicine? Side effects that you should report to your doctor or health care professional as soon as possible:  allergic reactions like skin rash, itching or hives, swelling of the face, lips, or tongue  bone pain  breathing problems  dizziness  jaw pain, especially after dental work  redness, blistering, peeling of the skin  signs and symptoms of infection like fever or chills; cough; sore throat; pain or trouble passing urine  signs of low calcium like fast heartbeat, muscle cramps or muscle pain; pain, tingling, numbness in the hands or feet; seizures  unusual bleeding or bruising  unusually weak or tired Side effects that usually do not require medical attention (report to your doctor or health care professional if they continue or are bothersome):  constipation  diarrhea  headache  joint pain  loss of appetite  muscle pain  runny nose  tiredness  upset stomach This list may not describe all possible side effects. Call your doctor for medical advice about side effects. You may report side effects to FDA at 1-800-FDA-1088. Where should I keep my medicine? This medicine is only given in a clinic, doctor's office, or other health care setting and will not be stored at home. NOTE: This sheet is a summary. It may not cover all possible information. If you have questions about this medicine, talk to your doctor, pharmacist, or health care provider.  2020 Elsevier/Gold Standard (2018-04-22 16:10:44)  _______________________________________________________________  Thank you for choosing [facility] at [facility] to provide your oncology and hematology care.  To afford each patient quality time with our providers,  please arrive at least 15 minutes before your scheduled appointment.  You need to re-schedule your appointment if you arrive 10 or more minutes late.  We strive to give you quality time with our providers, and arriving late affects you and other patients whose appointments are after yours.  Also, if you no show three or more times for appointments you may be dismissed from the clinic.  Again, thank you for choosing [facility]. Our hope is that these requests will allow you access to exceptional care and in a timely manner. _______________________________________________________________  If you have questions after your visit, please contact our office at [phone] between the hours of 8:30 a.m. and 5:00 p.m. Voicemails left after 4:30 p.m. will not be returned until the following business day. _______________________________________________________________  For prescription refill requests, have your pharmacy contact our office. _______________________________________________________________  Recommendations made by the consultant and any test results will be sent to your referring physician. _______________________________________________________________ 

## 2020-07-02 NOTE — Progress Notes (Signed)
SKIPPER DACOSTA presents today for denosumab injection. Lab work reviewed prior to administration. VSS. Pt reports taking Ca and Vit D as instructed. Pt denies tooth/jaw pain and denies recent or future invasive dental work. Injection tolerated well, see MAR for details. Site clean and dry, band aid applied. Pt discharged in satisfactory condition with follow up instructions.

## 2020-07-04 ENCOUNTER — Other Ambulatory Visit (HOSPITAL_COMMUNITY): Payer: Self-pay | Admitting: *Deleted

## 2020-07-04 DIAGNOSIS — G4700 Insomnia, unspecified: Secondary | ICD-10-CM

## 2020-07-04 DIAGNOSIS — I10 Essential (primary) hypertension: Secondary | ICD-10-CM

## 2020-07-04 MED ORDER — POMALIDOMIDE 2 MG PO CAPS: 2.0000 mg | ORAL_CAPSULE | Freq: Every day | ORAL | 0 refills | Status: DC

## 2020-07-04 NOTE — Telephone Encounter (Signed)
Chart reviewed, per Francene Finders, NP's last office note, okay to refill Pomalyst.

## 2020-07-08 ENCOUNTER — Other Ambulatory Visit: Payer: Self-pay

## 2020-07-08 ENCOUNTER — Inpatient Hospital Stay (HOSPITAL_COMMUNITY): Payer: Medicare Other

## 2020-07-08 ENCOUNTER — Inpatient Hospital Stay (HOSPITAL_BASED_OUTPATIENT_CLINIC_OR_DEPARTMENT_OTHER): Payer: Medicare Other | Admitting: Hematology

## 2020-07-08 DIAGNOSIS — C9002 Multiple myeloma in relapse: Secondary | ICD-10-CM | POA: Diagnosis not present

## 2020-07-08 DIAGNOSIS — C9000 Multiple myeloma not having achieved remission: Secondary | ICD-10-CM

## 2020-07-08 DIAGNOSIS — Z170 Estrogen receptor positive status [ER+]: Secondary | ICD-10-CM | POA: Diagnosis not present

## 2020-07-08 DIAGNOSIS — G629 Polyneuropathy, unspecified: Secondary | ICD-10-CM | POA: Diagnosis not present

## 2020-07-08 DIAGNOSIS — C50822 Malignant neoplasm of overlapping sites of left male breast: Secondary | ICD-10-CM | POA: Diagnosis not present

## 2020-07-08 DIAGNOSIS — Z5111 Encounter for antineoplastic chemotherapy: Secondary | ICD-10-CM | POA: Diagnosis not present

## 2020-07-08 DIAGNOSIS — D649 Anemia, unspecified: Secondary | ICD-10-CM | POA: Diagnosis not present

## 2020-07-08 LAB — LACTATE DEHYDROGENASE: LDH: 139 U/L (ref 98–192)

## 2020-07-08 LAB — CBC WITH DIFFERENTIAL/PLATELET
Abs Immature Granulocytes: 0.01 10*3/uL (ref 0.00–0.07)
Basophils Absolute: 0.1 10*3/uL (ref 0.0–0.1)
Basophils Relative: 1 %
Eosinophils Absolute: 0.7 10*3/uL — ABNORMAL HIGH (ref 0.0–0.5)
Eosinophils Relative: 13 %
HCT: 31.8 % — ABNORMAL LOW (ref 39.0–52.0)
Hemoglobin: 10.2 g/dL — ABNORMAL LOW (ref 13.0–17.0)
Immature Granulocytes: 0 %
Lymphocytes Relative: 18 %
Lymphs Abs: 1 10*3/uL (ref 0.7–4.0)
MCH: 34.3 pg — ABNORMAL HIGH (ref 26.0–34.0)
MCHC: 32.1 g/dL (ref 30.0–36.0)
MCV: 107.1 fL — ABNORMAL HIGH (ref 80.0–100.0)
Monocytes Absolute: 1 10*3/uL (ref 0.1–1.0)
Monocytes Relative: 17 %
Neutro Abs: 3 10*3/uL (ref 1.7–7.7)
Neutrophils Relative %: 51 %
Platelets: 146 10*3/uL — ABNORMAL LOW (ref 150–400)
RBC: 2.97 MIL/uL — ABNORMAL LOW (ref 4.22–5.81)
RDW: 14.7 % (ref 11.5–15.5)
WBC: 5.8 10*3/uL (ref 4.0–10.5)
nRBC: 0 % (ref 0.0–0.2)

## 2020-07-08 LAB — COMPREHENSIVE METABOLIC PANEL
ALT: 14 U/L (ref 0–44)
AST: 13 U/L — ABNORMAL LOW (ref 15–41)
Albumin: 3.4 g/dL — ABNORMAL LOW (ref 3.5–5.0)
Alkaline Phosphatase: 27 U/L — ABNORMAL LOW (ref 38–126)
Anion gap: 5 (ref 5–15)
BUN: 17 mg/dL (ref 8–23)
CO2: 26 mmol/L (ref 22–32)
Calcium: 8.5 mg/dL — ABNORMAL LOW (ref 8.9–10.3)
Chloride: 107 mmol/L (ref 98–111)
Creatinine, Ser: 1.6 mg/dL — ABNORMAL HIGH (ref 0.61–1.24)
GFR calc Af Amer: 49 mL/min — ABNORMAL LOW (ref >60)
GFR calc non Af Amer: 42 mL/min — ABNORMAL LOW (ref >60)
Glucose, Bld: 112 mg/dL — ABNORMAL HIGH (ref 70–99)
Potassium: 4.5 mmol/L (ref 3.5–5.1)
Sodium: 138 mmol/L (ref 135–145)
Total Bilirubin: 0.4 mg/dL (ref 0.3–1.2)
Total Protein: 5.5 g/dL — ABNORMAL LOW (ref 6.5–8.1)

## 2020-07-08 LAB — MAGNESIUM: Magnesium: 2 mg/dL (ref 1.7–2.4)

## 2020-07-08 MED ORDER — GABAPENTIN 300 MG PO CAPS: 300.0000 mg | ORAL_CAPSULE | Freq: Three times a day (TID) | ORAL | 2 refills | Status: DC

## 2020-07-08 MED ORDER — ACETAMINOPHEN 325 MG PO TABS: 650.0000 mg | ORAL_TABLET | Freq: Once | ORAL | Status: DC

## 2020-07-08 MED ORDER — DEXAMETHASONE 4 MG PO TABS: 20.0000 mg | ORAL_TABLET | Freq: Once | ORAL | Status: DC

## 2020-07-08 MED ORDER — DIPHENHYDRAMINE HCL 25 MG PO CAPS: 50.0000 mg | ORAL_CAPSULE | Freq: Once | ORAL | Status: DC

## 2020-07-08 MED ORDER — DARATUMUMAB-HYALURONIDASE-FIHJ 1800-30000 MG-UT/15ML ~~LOC~~ SOLN
1800.0000 mg | Freq: Once | SUBCUTANEOUS | Status: AC
  Administered 2020-07-08: 1800 mg via SUBCUTANEOUS
  Filled 2020-07-08: qty 15

## 2020-07-08 NOTE — Progress Notes (Signed)
Labs reviewed with MD today. Will proceed as planned. Patient took pre medications at home.   Treatment given per orders. Patient tolerated it well without problems. Vitals stable and discharged home from clinic ambulatory. Follow up as scheduled.

## 2020-07-08 NOTE — Patient Instructions (Signed)
[facility] at [name] Discharge Instructions  You were seen today by Dr. Katragadda. He went over your recent results. You received your treatment today. Dr. Katragadda will see you back in 4 weeks for labs and follow up.   Thank you for choosing [facility] at [facility] to provide your oncology and hematology care.  To afford each patient quality time with our provider, please arrive at least 15 minutes before your scheduled appointment time.   If you have a lab appointment with the Cancer Center please come in thru the Main Entrance and check in at the main information desk  You need to re-schedule your appointment should you arrive 10 or more minutes late.  We strive to give you quality time with our providers, and arriving late affects you and other patients whose appointments are after yours.  Also, if you no show three or more times for appointments you may be dismissed from the clinic at the providers discretion.     Again, thank you for choosing [facility].  Our hope is that these requests will decrease the amount of time that you wait before being seen by our physicians.       _____________________________________________________________  Should you have questions after your visit to [facility], please contact our office at [phone] between the hours of 8:00 a.m. and 4:30 p.m.  Voicemails left after 4:00 p.m. will not be returned until the following business day.  For prescription refill requests, have your pharmacy contact our office and allow 72 hours.    Cancer Center Support Programs:   > Cancer Support Group  2nd Tuesday of the month 1pm-2pm, Journey Room    

## 2020-07-08 NOTE — Progress Notes (Signed)
[place], [place] [address]   CLINIC:  Medical Oncology/Hematology  PCP:  [name], MD [address] [place] [address] [phone]   REASON FOR VISIT:  Follow-up for multiple myeloma  PRIOR THERAPY: Stem cell transplant on 09/30/2017  CURRENT THERAPY: Darzalex Faspro, pomalidomide and dexamethasone  BRIEF ONCOLOGIC HISTORY:  Oncology History  Breast cancer, male ([place])  12/31/2009 Initial Biopsy   Biopsy of L breast    12/31/2009 Pathology Results   Invasive ductal carcinoma, ER/PR+, HER 2 negative   12/31/2009 Imaging   Ultrasound showing a 2.43 x 1.85 x 3 cm hypoechoic spiculated mass in the 12 o clock L breast retroareolar region   01/01/2010 -  Anti-estrogen oral therapy   Tamoxifen 20 mg daily   01/06/2010 Imaging   Bone scan abnormal uptake in the diaphysis of the R humerus, abnormal in the R third, fifth and sixth ribs, lesion also noted in the sternum.   02/03/2010 Surgery   Rod placement and fixation of R humerus by Dr. [name]   02/05/2010 - 02/18/2010 Radiation Therapy   30Gy in 10 fractions of 3 Gy per fraction to R pathologic fracture   03/11/2010 -  Chemotherapy   Denosumab monthly, now every 3 months. Started at [facility]    06/09/2016 Imaging   Three hypermetabolic osseous lesions in the sternum, left ilium and right ilium, as discussed above, likely represent osseous metastases. At this time, these are not recognizable on the CT images. 2. No extra skeletal metastatic disease identified in the neck, chest, abdomen or pelvis.   10/13/2016 Progression   PET shows various new and enlarging osseous metastatic lesions with no definite extra osseous metastatic disease currently identified.    12/31/2016 Progression   1. Multifocal hypermetabolic osseous metastases throughout the axial and proximal appendicular skeleton, which are increased in size, number and metabolism since 10/13/2016 PET-CT. 2. 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 3. No additional sites of hypermetabolic metastatic disease. 4. Chronic right mastoid sinusitis. 5. Aortic atherosclerosis.  One vessel coronary atherosclerosis.   Multiple myeloma ([place])  02/12/2017 Bone Marrow Biopsy   The marrow was variably cellular with large peritrabecular aggregates of kappa restricted plasma cells (66% by aspirate, 30% by Cd138). Cytogenetics +11.    03/01/2017 - 06/29/2017 Chemotherapy   RVD    05/26/2017 Bone Marrow Biopsy   Performed at [facility]:  Plasma cell myeloma in a 30% cellularmarrow with decreased trilineage hematopoiesis and 42% kappalight chain restricted plasma cells on the aspirate smears andlarge aggregates on the core biopsy.    07/12/2017 - 09/01/2017 Chemotherapy   3 cycles of carfizolmib/cyclophosphamide/dexamethasone     10/08/2017 Bone Marrow Transplant   Autotransplant at [facility]   02/28/2020 -  Chemotherapy   The patient had dexamethasone (DECADRON) tablet 20 mg, 20 mg, Oral,  Once, 2 of 2 cycles dexamethasone (DECADRON) tablet 20 mg, 20 mg, Oral, Once, 7 of 11 cycles Administration: 20 mg (03/25/2020) daratumumab-hyaluronidase-fihj (DARZALEX FASPRO) 1800-30000 MG-UT/15ML chemo SQ injection 1,800 mg, 1,800 mg, Subcutaneous,  Once, 7 of 11 cycles Administration: 1,800 mg (02/28/2020), 1,800 mg (03/05/2020), 1,800 mg (03/12/2020), 1,800 mg (03/18/2020), 1,800 mg (03/25/2020), 1,800 mg (04/01/2020), 1,800 mg (04/29/2020), 1,800 mg (04/08/2020), 1,800 mg (04/15/2020), 1,800 mg (04/22/2020), 1,800 mg (05/28/2020), 1,800 mg (06/11/2020), 1,800 mg (06/24/2020) bortezomib SQ (VELCADE) chemo injection 2.75 mg, 1.3  mg/m2 = 2.75 mg, Subcutaneous,  Once, 4 of 4 cycles Administration: 2.75 mg (02/28/2020), 2.75 mg (03/05/2020), 2.75 mg (03/08/2020), 2.75 mg (03/18/2020), 2.75 mg (03/21/2020), 2.75 mg (03/25/2020), 2.75  mg (03/28/2020), 2.75 mg (04/29/2020), 2.75 mg (05/02/2020), 2.75 mg (05/06/2020), 2.75 mg (05/09/2020), 2.75 mg (04/08/2020), 2.75 mg (04/11/2020), 2.75 mg (04/15/2020), 2.75 mg (04/18/2020)  for chemotherapy treatment.    Multiple myeloma not having achieved remission ([place])  06/29/2017 Initial Diagnosis   Multiple myeloma not having achieved remission ([place])   02/28/2020 -  Chemotherapy   The patient had dexamethasone (DECADRON) tablet 20 mg, 20 mg, Oral,  Once, 2 of 2 cycles dexamethasone (DECADRON) tablet 20 mg, 20 mg, Oral, Once, 7 of 11 cycles Administration: 20 mg (03/25/2020) daratumumab-hyaluronidase-fihj (DARZALEX FASPRO) 1800-30000 MG-UT/15ML chemo SQ injection 1,800 mg, 1,800 mg, Subcutaneous,  Once, 7 of 11 cycles Administration: 1,800 mg (02/28/2020), 1,800 mg (03/05/2020), 1,800 mg (03/12/2020), 1,800 mg (03/18/2020), 1,800 mg (03/25/2020), 1,800 mg (04/01/2020), 1,800 mg (04/29/2020), 1,800 mg (04/08/2020), 1,800 mg (04/15/2020), 1,800 mg (04/22/2020), 1,800 mg (05/28/2020), 1,800 mg (06/11/2020), 1,800 mg (06/24/2020) bortezomib SQ (VELCADE) chemo injection 2.75 mg, 1.3 mg/m2 = 2.75 mg, Subcutaneous,  Once, 4 of 4 cycles Administration: 2.75 mg (02/28/2020), 2.75 mg (03/05/2020), 2.75 mg (03/08/2020), 2.75 mg (03/18/2020), 2.75 mg (03/21/2020), 2.75 mg (03/25/2020), 2.75 mg (03/28/2020), 2.75 mg (04/29/2020), 2.75 mg (05/02/2020), 2.75 mg (05/06/2020), 2.75 mg (05/09/2020), 2.75 mg (04/08/2020), 2.75 mg (04/11/2020), 2.75 mg (04/15/2020), 2.75 mg (04/18/2020)  for chemotherapy treatment.      CANCER STAGING: Cancer Staging Multiple myeloma ([place]) Staging form: Plasma Cell Myeloma and Plasma Cell Disorders, AJCC 8th Edition - Clinical stage from 05/03/2017: Beta-2-microglobulin (mg/L): 3.5, Albumin (g/dL): 3.4, ISS: Stage II, High-risk cytogenetics: Absent, LDH: Not assessed - Signed by Baird Cancer, PA-C on 05/03/2017   INTERVAL HISTORY:  Johnny Lucas, a 74 y.o. male, returns for routine follow-up and consideration for  next cycle of chemotherapy. Johnny Lucas was last seen on 06/11/2020.  Due for cycle #8 of Darzalex Faspro today.   Overall, he tells me he has been feeling pretty well. Today he reports feeling so much better after starting 2 mg of Pomalyst 2 weeks ago. His shakiness has resolved and he denies having N/V/D, F/C or orthopnea.  He is seeing Dr. Felipa Evener on 07/15/2020 at [facility].  Overall, he feels ready for next cycle of chemo today.    REVIEW OF SYSTEMS:  Review of Systems  Constitutional: Positive for appetite change (mildly decreased) and fatigue (mild). Negative for chills and fever.  Respiratory: Negative for shortness of breath.   Gastrointestinal: Negative for diarrhea, nausea and vomiting.  Neurological: Positive for numbness (feet).  Psychiatric/Behavioral: The patient is nervous/anxious.   All other systems reviewed and are negative.   PAST MEDICAL/SURGICAL HISTORY:  Past Medical History:  Diagnosis Date  . Anxiety   . Bone metastases ([place]) 09/10/2016  . Breast cancer ([place]) 2011   Stave IV breast cancer; radiation and tamoxifen  . Breast cancer, male ([place])    Stave IV breast cancer; radiation and tamoxifen  Overview:  Left breast ca with mets bone  Overview:  METS TO BONE  . GERD (gastroesophageal reflux disease)   . Hypertension   . Macular degeneration   . Multiple myeloma ([place]) 02/18/2017  . Peripheral neuropathy    Past Surgical History:  Procedure Laterality Date  . BACK SURGERY    . CATARACT EXTRACTION W/PHACO Left 02/03/2019   Procedure: CATARACT EXTRACTION PHACO AND INTRAOCULAR LENS PLACEMENT ([place]);  Surgeon: Baruch Goldmann, MD;  Location: [facility];  Service: Ophthalmology;  Laterality: Left;  CDE: 15.89  . HERNIA REPAIR    . PORTACATH PLACEMENT Left 06/10/2018   Procedure: INSERTION PORT-A-CATH;  Surgeon: Virl Cagey, MD;  Location: [facility];  Service: General;  Laterality: Left;  . right arm surgery      SOCIAL HISTORY:  Social History   Socioeconomic History    . Marital status: Married    Spouse name: Not on file  . Number of children: 3  . Years of education: Not on file  . Highest education level: Not on file  Occupational History  . Not on file  Tobacco Use  . Smoking status: Never Smoker  . Smokeless tobacco: Former Network engineer and Sexual Activity  . Alcohol use: Yes    Alcohol/week: 24.0 standard drinks    Types: 24 Cans of beer per week  . Drug use: No  . Sexual activity: Not on file  Other Topics Concern  . Not on file  Social History Narrative  . Not on file   Social Determinants of Health   Financial Resource Strain:   . Difficulty of Paying Living Expenses:   Food Insecurity:   . Worried About Charity fundraiser in the Last Year:   . Arboriculturist in the Last Year:   Transportation Needs:   . Film/video editor (Medical):   Marland Kitchen Lack of Transportation (Non-Medical):   Physical Activity:   . Days of Exercise per Week:   . Minutes of Exercise per Session:   Stress:   . Feeling of Stress :   Social Connections:   . Frequency of Communication with Friends and Family:   . Frequency of Social Gatherings with Friends and Family:   . Attends Religious Services:   . Active Member of Clubs or Organizations:   . Attends Archivist Meetings:   Marland Kitchen Marital Status:   Intimate Partner Violence:   . Fear of Current or Ex-Partner:   . Emotionally Abused:   Marland Kitchen Physically Abused:   . Sexually Abused:     FAMILY HISTORY:  Family History  Problem Relation Age of Onset  . Stroke Mother   . Cancer Maternal Aunt        cancer NOS; died in her 20s  . Lung cancer Maternal Uncle        smoker    CURRENT MEDICATIONS:  Current Outpatient Medications  Medication Sig Dispense Refill  . acetaminophen (TYLENOL) 500 MG tablet Take 500 mg by mouth every 6 (six) hours as needed.    Marland Kitchen acyclovir (ZOVIRAX) 800 MG tablet TAKE 1 TABLET TWICE DAILY 180 tablet 3  . ALPRAZolam (XANAX) 0.5 MG tablet TAKE 1 TABLET BY MOUTH AT  BEDTIME AS NEEDED FOR ANXIETY OR SLEEP 30 tablet 3  . amLODipine (NORVASC) 5 MG tablet Take 1 tablet (5 mg total) by mouth daily. 30 tablet 0  . aspirin EC 81 MG tablet Take 81 mg by mouth daily.    Marland Kitchen augmented betamethasone dipropionate (DIPROLENE-AF) 0.05 % cream Apply topically as needed. 30 g 6  . calcium-vitamin D (OSCAL WITH D) 500-200 MG-UNIT TABS tablet TAKE TWO TABLETS BY MOUTH DAILY 60 tablet 6  . Cholecalciferol (VITAMIN D) 50 MCG (2000 UT) CAPS Take 4,000 Units by mouth 2 (two) times daily.    Marland Kitchen DARATUMUMAB-HYALURONIDASE-FIHJ Esmeralda Inject 1,800 mg into the skin every 14 (fourteen) days.     Marland Kitchen denosumab (XGEVA) 120  MG/1.7ML SOLN injection Inject 120 mg into the skin every 30 (thirty) days.     Marland Kitchen dexamethasone (DECADRON) 4 MG tablet Take 5 pills before each treatment (Patient taking differently: Take 5 pills weekly.) 40 tablet 5  . diclofenac Sodium (VOLTAREN) 1 % GEL Use 1 to 2 times per day as directed by doctor    . folic acid (FOLVITE) 1 MG tablet TAKE 1 TABLET BY MOUTH DAILY 90 tablet 2  . gabapentin (NEURONTIN) 300 MG capsule Take 1 capsule (300 mg total) by mouth 3 (three) times daily. 180 capsule 2  . hydrALAZINE (APRESOLINE) 50 MG tablet Take 1 tablet (50 mg total) by mouth every 8 (eight) hours. 90 tablet 0  . loratadine (CLARITIN) 10 MG tablet Take 1 tablet (10 mg total) by mouth daily. 90 tablet 1  . metoprolol succinate (TOPROL-XL) 100 MG 24 hr tablet Take 100 mg by mouth daily.     . Multiple Vitamin (MULTIVITAMIN WITH MINERALS) TABS tablet Take 1 tablet by mouth daily.    . ondansetron (ZOFRAN) 8 MG tablet TAKE ONE TABLET BY MOUTH EVERY 8 HOURS AS NEEDED 30 tablet 2  . pantoprazole (PROTONIX) 40 MG tablet Take 40 mg by mouth every other day.     . polyethylene glycol (MIRALAX / GLYCOLAX) packet Take 17 g by mouth daily as needed for mild constipation. 14 each 0  . pomalidomide (POMALYST) 2 MG capsule Take 1 capsule (2 mg total) by mouth daily. 21 capsule 0  .  prochlorperazine (COMPAZINE) 10 MG tablet Take 1 tablet (10 mg total) by mouth every 6 (six) hours as needed for nausea or vomiting. 30 tablet 2  . pseudoephedrine-guaifenesin (MUCINEX D) 60-600 MG 12 hr tablet Take 1 tablet by mouth 2 (two) times daily as needed for congestion.     . tamoxifen (NOLVADEX) 20 MG tablet TAKE 1 TABLET (20 MG TOTAL) BY MOUTH DAILY. 90 tablet 3  . vitamin B-12 (CYANOCOBALAMIN) 1000 MCG tablet Take 1,000 mcg by mouth daily.     No current facility-administered medications for this visit.    ALLERGIES:  Allergies  Allergen Reactions  . Cefepime     Suspected severe thrombocytopenia is a result of cefepime induced antigen platelet destruction    PHYSICAL EXAM:  Performance status (ECOG): 1 - Symptomatic but completely ambulatory  There were no vitals filed for this visit. Wt Readings from Last 3 Encounters:  07/02/20 210 lb 9.6 oz (95.5 kg)  06/24/20 207 lb 12.8 oz (94.3 kg)  06/11/20 206 lb 8 oz (93.7 kg)   Physical Exam Vitals reviewed.  Constitutional:      Appearance: Normal appearance. He is obese.  Cardiovascular:     Rate and Rhythm: Normal rate and regular rhythm.     Pulses: Normal pulses.     Heart sounds: Normal heart sounds.  Pulmonary:     Effort: Pulmonary effort is normal.     Breath sounds: Normal breath sounds.  Musculoskeletal:     Right lower leg: Edema (trace) present.     Left lower leg: Edema (trace) present.  Neurological:     General: No focal deficit present.     Mental Status: He is alert and oriented to person, place, and time.  Psychiatric:        Mood and Affect: Mood normal.        Behavior: Behavior normal.     LABORATORY DATA:  I have reviewed the labs as listed.  CBC Latest Ref Rng & Units 07/08/2020  06/24/2020 06/11/2020  WBC 4.0 - 10.5 K/uL 5.8 5.9 6.1  Hemoglobin 13.0 - 17.0 g/dL 10.2(L) 11.0(L) 11.6(L)  Hematocrit 39 - 52 % 31.8(L) 33.4(L) 34.6(L)  Platelets 150 - 400 K/uL 146(L) 224 148(L)   CMP Latest  Ref Rng & Units 07/02/2020 06/24/2020 06/11/2020  Glucose 70 - 99 mg/dL 131(H) 109(H) 115(H)  BUN 8 - 23 mg/dL _0 Creatinine 0.61 - 1.24 mg/dL 1.58(H) 1.80(H) 1.65(H)  Sodium 135 - 145 mmol/L 138 140 137  Potassium 3.5 - 5.1 mmol/L 4.3 4.0 4.1  Chloride 98 - 111 mmol/L 103 103 101  CO2 22 - 32 mmol/L _1 Calcium 8.9 - 10.3 mg/dL 9.2 9.6 9.3  Total Protein 6.5 - 8.1 g/dL 6.0(L) 5.8(L) 6.2(L)  Total Bilirubin 0.3 - 1.2 mg/dL 0.4 0.4 0.5  Alkaline Phos 38 - 126 U/L 31(L) 26(L) 25(L)  AST 15 - 41 U/L _2 ALT 0 - 44 U/L _3 Lab Results  Component Value Date   LDH 139 07/08/2020   LDH 130 06/24/2020   LDH 129 06/11/2020   Lab Results  Component Value Date   TOTALPROTELP 5.5 (L) 06/24/2020   ALBUMINELP 3.4 06/24/2020   A1GS 0.3 06/24/2020   A2GS 0.6 06/24/2020   BETS 0.8 06/24/2020   GAMS 0.4 06/24/2020   MSPIKE 0.2 (H) 06/24/2020   SPEI Comment 06/24/2020    Lab Results  Component Value Date   KPAFRELGTCHN 174.7 (H) 06/24/2020   LAMBDASER <1.5 (L) 06/24/2020   KAPLAMBRATIO Note: (A) 06/24/2020    DIAGNOSTIC IMAGING:  I have independently reviewed the scans and discussed with the patient. No results found.   ASSESSMENT:  1. Relapsed multiple myeloma: -Autologous stem cell transplant on 10/08/2017 -Post transplant consolidation withCyCarD for 2 cycles with M spike undetectable. -Maintenance therapy with carfilzomib 70 mg per metered square days 1 and 15 every 28 days from 04/20/2018, dose reduced to 35 mg per metered square on 01/05/2019, titrated up to 56 mg per metered square on 01/09/2020. 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 -PET scan on 02/18/2020 showed new left supraclavicular lymph node at the thoracic inlet, SUV 14.2. Multifocal osseous lesions largely improved, though residual lesions noted including right acromion, left acromion, thyroid cartilage, right sternum. Additional lesions throughout  the sternum, bilateral ribs, thoracolumbar spine, bilateral pelvis have resolved. -BMBX on 02/14/2020 shows plasma cell myeloma, 60-70% of cells. FISH for myeloma was negative. Cytogenetics was normal. -4 cycles of daratumumab, bortezomib and dexamethasone from 02/28/2020 through 04/29/2020. -Daratumumab, pomalidomide and dexamethasone started on 05/28/2020.  He is receiving daratumumab every 2 weeks, Pomalyst 3 mg days 1-21, dexamethasone 20 mg weekly. -Pomalidomide dose reduced to 2 mg days 1-21 around the first week of July 2021, due to severe weakness.   PLAN:  1. Relapsed IgG kappa multiple myeloma: -He has been doing fairly well since we cut back on the dose of pomalidomide. -Myeloma panel on 06/24/2020 shows M spike to be stable.  Kappa light chains went up to 174.  However he was off of pomalidomide during that time. -He will proceed with daratumumab today and in 2 weeks.  I plan to see him back in 4 weeks with repeat myeloma panel. -He is following up with Dr. Marcell Anger at [facility] next week.  2. Myeloma bone disease: -Continue monthly denosumab.  Calcium is 8.5.  3. Macrocytic anemia: -Continue Procrit for hemoglobin less than 9.  4. ID  prophylaxis: -Continue acyclovir twice daily.  5. Peripheral neuropathy: -Continue gabapentin 300 mg 3 times daily.  6. History of breast cancer: -Continue tamoxifen daily.   Orders placed this encounter:  No orders of the defined types were placed in this encounter.    Derek Jack, MD [facility] [phone]   I, Milinda Antis, am acting as a scribe for Dr. Sanda Linger.  I, Derek Jack MD, have reviewed the above documentation for accuracy and completeness, and I agree with the above.

## 2020-07-11 NOTE — Addendum Note (Signed)
Addended by: Farley Ly on: 07/11/2020 04:26 PM   Modules accepted: Orders

## 2020-07-15 ENCOUNTER — Ambulatory Visit (HOSPITAL_COMMUNITY): Payer: Medicare Other | Admitting: Hematology

## 2020-07-15 ENCOUNTER — Other Ambulatory Visit (HOSPITAL_COMMUNITY): Payer: Medicare Other

## 2020-07-15 ENCOUNTER — Ambulatory Visit (HOSPITAL_COMMUNITY): Payer: Medicare Other

## 2020-07-15 DIAGNOSIS — T451X5D Adverse effect of antineoplastic and immunosuppressive drugs, subsequent encounter: Secondary | ICD-10-CM | POA: Diagnosis not present

## 2020-07-15 DIAGNOSIS — Z79899 Other long term (current) drug therapy: Secondary | ICD-10-CM | POA: Diagnosis not present

## 2020-07-15 DIAGNOSIS — Z923 Personal history of irradiation: Secondary | ICD-10-CM | POA: Diagnosis not present

## 2020-07-15 DIAGNOSIS — Z9229 Personal history of other drug therapy: Secondary | ICD-10-CM | POA: Diagnosis not present

## 2020-07-15 DIAGNOSIS — Z170 Estrogen receptor positive status [ER+]: Secondary | ICD-10-CM | POA: Diagnosis not present

## 2020-07-15 DIAGNOSIS — D801 Nonfamilial hypogammaglobulinemia: Secondary | ICD-10-CM | POA: Diagnosis not present

## 2020-07-15 DIAGNOSIS — Z7952 Long term (current) use of systemic steroids: Secondary | ICD-10-CM | POA: Diagnosis not present

## 2020-07-15 DIAGNOSIS — Z9484 Stem cells transplant status: Secondary | ICD-10-CM | POA: Diagnosis not present

## 2020-07-15 DIAGNOSIS — Z9221 Personal history of antineoplastic chemotherapy: Secondary | ICD-10-CM | POA: Diagnosis not present

## 2020-07-15 DIAGNOSIS — Z9889 Other specified postprocedural states: Secondary | ICD-10-CM | POA: Diagnosis not present

## 2020-07-15 DIAGNOSIS — G620 Drug-induced polyneuropathy: Secondary | ICD-10-CM | POA: Diagnosis not present

## 2020-07-15 DIAGNOSIS — Z9222 Personal history of monoclonal drug therapy: Secondary | ICD-10-CM | POA: Diagnosis not present

## 2020-07-15 DIAGNOSIS — C9000 Multiple myeloma not having achieved remission: Secondary | ICD-10-CM | POA: Diagnosis not present

## 2020-07-15 DIAGNOSIS — C50922 Malignant neoplasm of unspecified site of left male breast: Secondary | ICD-10-CM | POA: Diagnosis not present

## 2020-07-15 DIAGNOSIS — Z79810 Long term (current) use of selective estrogen receptor modulators (SERMs): Secondary | ICD-10-CM | POA: Diagnosis not present

## 2020-07-15 DIAGNOSIS — C9002 Multiple myeloma in relapse: Secondary | ICD-10-CM | POA: Diagnosis not present

## 2020-07-22 ENCOUNTER — Inpatient Hospital Stay (HOSPITAL_COMMUNITY): Payer: Medicare Other

## 2020-07-22 ENCOUNTER — Encounter (HOSPITAL_COMMUNITY): Payer: Self-pay

## 2020-07-22 ENCOUNTER — Other Ambulatory Visit: Payer: Self-pay

## 2020-07-22 VITALS — BP 130/75 | HR 66 | Temp 97.5°F | Resp 18 | Wt 206.8 lb

## 2020-07-22 DIAGNOSIS — G629 Polyneuropathy, unspecified: Secondary | ICD-10-CM | POA: Diagnosis not present

## 2020-07-22 DIAGNOSIS — Z170 Estrogen receptor positive status [ER+]: Secondary | ICD-10-CM | POA: Diagnosis not present

## 2020-07-22 DIAGNOSIS — D649 Anemia, unspecified: Secondary | ICD-10-CM | POA: Diagnosis not present

## 2020-07-22 DIAGNOSIS — C50822 Malignant neoplasm of overlapping sites of left male breast: Secondary | ICD-10-CM | POA: Diagnosis not present

## 2020-07-22 DIAGNOSIS — C9000 Multiple myeloma not having achieved remission: Secondary | ICD-10-CM

## 2020-07-22 DIAGNOSIS — C9002 Multiple myeloma in relapse: Secondary | ICD-10-CM | POA: Diagnosis not present

## 2020-07-22 DIAGNOSIS — Z5111 Encounter for antineoplastic chemotherapy: Secondary | ICD-10-CM | POA: Diagnosis not present

## 2020-07-22 LAB — COMPREHENSIVE METABOLIC PANEL
ALT: 15 U/L (ref 0–44)
AST: 15 U/L (ref 15–41)
Albumin: 3.7 g/dL (ref 3.5–5.0)
Alkaline Phosphatase: 31 U/L — ABNORMAL LOW (ref 38–126)
Anion gap: 9 (ref 5–15)
BUN: 19 mg/dL (ref 8–23)
CO2: 26 mmol/L (ref 22–32)
Calcium: 9.1 mg/dL (ref 8.9–10.3)
Chloride: 101 mmol/L (ref 98–111)
Creatinine, Ser: 1.77 mg/dL — ABNORMAL HIGH (ref 0.61–1.24)
GFR calc Af Amer: 44 mL/min — ABNORMAL LOW (ref >60)
GFR calc non Af Amer: 38 mL/min — ABNORMAL LOW (ref >60)
Glucose, Bld: 130 mg/dL — ABNORMAL HIGH (ref 70–99)
Potassium: 4.3 mmol/L (ref 3.5–5.1)
Sodium: 136 mmol/L (ref 135–145)
Total Bilirubin: 0.3 mg/dL (ref 0.3–1.2)
Total Protein: 6.2 g/dL — ABNORMAL LOW (ref 6.5–8.1)

## 2020-07-22 LAB — CBC WITH DIFFERENTIAL/PLATELET
Abs Immature Granulocytes: 0.01 10*3/uL (ref 0.00–0.07)
Basophils Absolute: 0.1 10*3/uL (ref 0.0–0.1)
Basophils Relative: 2 %
Eosinophils Absolute: 0.5 10*3/uL (ref 0.0–0.5)
Eosinophils Relative: 11 %
HCT: 32.4 % — ABNORMAL LOW (ref 39.0–52.0)
Hemoglobin: 10.7 g/dL — ABNORMAL LOW (ref 13.0–17.0)
Immature Granulocytes: 0 %
Lymphocytes Relative: 13 %
Lymphs Abs: 0.5 10*3/uL — ABNORMAL LOW (ref 0.7–4.0)
MCH: 34.5 pg — ABNORMAL HIGH (ref 26.0–34.0)
MCHC: 33 g/dL (ref 30.0–36.0)
MCV: 104.5 fL — ABNORMAL HIGH (ref 80.0–100.0)
Monocytes Absolute: 0.5 10*3/uL (ref 0.1–1.0)
Monocytes Relative: 12 %
Neutro Abs: 2.6 10*3/uL (ref 1.7–7.7)
Neutrophils Relative %: 62 %
Platelets: 278 10*3/uL (ref 150–400)
RBC: 3.1 MIL/uL — ABNORMAL LOW (ref 4.22–5.81)
RDW: 14.7 % (ref 11.5–15.5)
WBC: 4.2 10*3/uL (ref 4.0–10.5)
nRBC: 0 % (ref 0.0–0.2)

## 2020-07-22 LAB — MAGNESIUM: Magnesium: 2 mg/dL (ref 1.7–2.4)

## 2020-07-22 LAB — LACTATE DEHYDROGENASE: LDH: 159 U/L (ref 98–192)

## 2020-07-22 MED ORDER — DARATUMUMAB-HYALURONIDASE-FIHJ 1800-30000 MG-UT/15ML ~~LOC~~ SOLN
1800.0000 mg | Freq: Once | SUBCUTANEOUS | Status: AC
  Administered 2020-07-22: 1800 mg via SUBCUTANEOUS
  Filled 2020-07-22: qty 15

## 2020-07-22 NOTE — Progress Notes (Signed)
Patient takes own premeds at home for treatment day.  Serum Creatinine 1.77 today.   Ok to treat today verbal order Dr. Delton Coombes.   Patient tolerated daratumumab injection with no complaints voiced.  Lab work reviewed.  See MAR for details.  Injection site clean and dry with no bruising or swelling noted.  Band aid applied.  VSS.  Patient left in satisfactory condition with no s/s of distress noted.

## 2020-07-23 LAB — KAPPA/LAMBDA LIGHT CHAINS
Kappa free light chain: 61.2 mg/L — ABNORMAL HIGH (ref 3.3–19.4)
Kappa, lambda light chain ratio: 26.61 — ABNORMAL HIGH (ref 0.26–1.65)
Lambda free light chains: 2.3 mg/L — ABNORMAL LOW (ref 5.7–26.3)

## 2020-07-24 LAB — PROTEIN ELECTROPHORESIS, SERUM
A/G Ratio: 1.6 (ref 0.7–1.7)
Albumin ELP: 3.3 g/dL (ref 2.9–4.4)
Alpha-1-Globulin: 0.3 g/dL (ref 0.0–0.4)
Alpha-2-Globulin: 0.7 g/dL (ref 0.4–1.0)
Beta Globulin: 0.8 g/dL (ref 0.7–1.3)
Gamma Globulin: 0.3 g/dL — ABNORMAL LOW (ref 0.4–1.8)
Globulin, Total: 2.1 g/dL — ABNORMAL LOW (ref 2.2–3.9)
M-Spike, %: 0.1 g/dL — ABNORMAL HIGH
Total Protein ELP: 5.4 g/dL — ABNORMAL LOW (ref 6.0–8.5)

## 2020-08-03 ENCOUNTER — Other Ambulatory Visit (HOSPITAL_COMMUNITY): Payer: Self-pay | Admitting: Hematology

## 2020-08-03 DIAGNOSIS — I10 Essential (primary) hypertension: Secondary | ICD-10-CM

## 2020-08-03 DIAGNOSIS — G4700 Insomnia, unspecified: Secondary | ICD-10-CM

## 2020-08-05 ENCOUNTER — Inpatient Hospital Stay (HOSPITAL_COMMUNITY): Payer: Medicare Other

## 2020-08-05 ENCOUNTER — Inpatient Hospital Stay (HOSPITAL_COMMUNITY): Payer: Medicare Other | Attending: Hematology

## 2020-08-05 ENCOUNTER — Other Ambulatory Visit: Payer: Self-pay

## 2020-08-05 ENCOUNTER — Inpatient Hospital Stay (HOSPITAL_BASED_OUTPATIENT_CLINIC_OR_DEPARTMENT_OTHER): Payer: Medicare Other | Admitting: Hematology

## 2020-08-05 VITALS — BP 140/67 | HR 66 | Temp 97.5°F | Resp 18 | Wt 206.8 lb

## 2020-08-05 DIAGNOSIS — Z5111 Encounter for antineoplastic chemotherapy: Secondary | ICD-10-CM | POA: Diagnosis not present

## 2020-08-05 DIAGNOSIS — C9000 Multiple myeloma not having achieved remission: Secondary | ICD-10-CM

## 2020-08-05 DIAGNOSIS — G629 Polyneuropathy, unspecified: Secondary | ICD-10-CM | POA: Insufficient documentation

## 2020-08-05 DIAGNOSIS — Z79810 Long term (current) use of selective estrogen receptor modulators (SERMs): Secondary | ICD-10-CM | POA: Diagnosis not present

## 2020-08-05 DIAGNOSIS — I10 Essential (primary) hypertension: Secondary | ICD-10-CM | POA: Diagnosis not present

## 2020-08-05 DIAGNOSIS — D509 Iron deficiency anemia, unspecified: Secondary | ICD-10-CM | POA: Insufficient documentation

## 2020-08-05 DIAGNOSIS — Z79899 Other long term (current) drug therapy: Secondary | ICD-10-CM | POA: Insufficient documentation

## 2020-08-05 DIAGNOSIS — K219 Gastro-esophageal reflux disease without esophagitis: Secondary | ICD-10-CM | POA: Diagnosis not present

## 2020-08-05 DIAGNOSIS — Z923 Personal history of irradiation: Secondary | ICD-10-CM | POA: Diagnosis not present

## 2020-08-05 DIAGNOSIS — C7951 Secondary malignant neoplasm of bone: Secondary | ICD-10-CM | POA: Diagnosis not present

## 2020-08-05 DIAGNOSIS — C50922 Malignant neoplasm of unspecified site of left male breast: Secondary | ICD-10-CM | POA: Insufficient documentation

## 2020-08-05 DIAGNOSIS — Z9221 Personal history of antineoplastic chemotherapy: Secondary | ICD-10-CM | POA: Diagnosis not present

## 2020-08-05 DIAGNOSIS — Z9484 Stem cells transplant status: Secondary | ICD-10-CM | POA: Diagnosis not present

## 2020-08-05 LAB — COMPREHENSIVE METABOLIC PANEL
ALT: 14 U/L (ref 0–44)
AST: 10 U/L — ABNORMAL LOW (ref 15–41)
Albumin: 3.3 g/dL — ABNORMAL LOW (ref 3.5–5.0)
Alkaline Phosphatase: 26 U/L — ABNORMAL LOW (ref 38–126)
Anion gap: 8 (ref 5–15)
BUN: 18 mg/dL (ref 8–23)
CO2: 27 mmol/L (ref 22–32)
Calcium: 10 mg/dL (ref 8.9–10.3)
Chloride: 102 mmol/L (ref 98–111)
Creatinine, Ser: 2.1 mg/dL — ABNORMAL HIGH (ref 0.61–1.24)
GFR calc Af Amer: 35 mL/min — ABNORMAL LOW (ref >60)
GFR calc non Af Amer: 31 mL/min — ABNORMAL LOW (ref >60)
Glucose, Bld: 102 mg/dL — ABNORMAL HIGH (ref 70–99)
Potassium: 4 mmol/L (ref 3.5–5.1)
Sodium: 137 mmol/L (ref 135–145)
Total Bilirubin: 0.6 mg/dL (ref 0.3–1.2)
Total Protein: 5.7 g/dL — ABNORMAL LOW (ref 6.5–8.1)

## 2020-08-05 LAB — CBC WITH DIFFERENTIAL/PLATELET
Abs Immature Granulocytes: 0.01 10*3/uL (ref 0.00–0.07)
Basophils Absolute: 0.1 10*3/uL (ref 0.0–0.1)
Basophils Relative: 2 %
Eosinophils Absolute: 1.7 10*3/uL — ABNORMAL HIGH (ref 0.0–0.5)
Eosinophils Relative: 32 %
HCT: 31.3 % — ABNORMAL LOW (ref 39.0–52.0)
Hemoglobin: 10.1 g/dL — ABNORMAL LOW (ref 13.0–17.0)
Immature Granulocytes: 0 %
Lymphocytes Relative: 10 %
Lymphs Abs: 0.5 10*3/uL — ABNORMAL LOW (ref 0.7–4.0)
MCH: 33.6 pg (ref 26.0–34.0)
MCHC: 32.3 g/dL (ref 30.0–36.0)
MCV: 104 fL — ABNORMAL HIGH (ref 80.0–100.0)
Monocytes Absolute: 0.8 10*3/uL (ref 0.1–1.0)
Monocytes Relative: 15 %
Neutro Abs: 2.3 10*3/uL (ref 1.7–7.7)
Neutrophils Relative %: 41 %
Platelets: 186 10*3/uL (ref 150–400)
RBC: 3.01 MIL/uL — ABNORMAL LOW (ref 4.22–5.81)
RDW: 14.7 % (ref 11.5–15.5)
WBC: 5.4 10*3/uL (ref 4.0–10.5)
nRBC: 0 % (ref 0.0–0.2)

## 2020-08-05 LAB — MAGNESIUM: Magnesium: 1.9 mg/dL (ref 1.7–2.4)

## 2020-08-05 LAB — LACTATE DEHYDROGENASE: LDH: 107 U/L (ref 98–192)

## 2020-08-05 MED ORDER — DENOSUMAB 120 MG/1.7ML ~~LOC~~ SOLN
SUBCUTANEOUS | Status: AC
  Filled 2020-08-05: qty 1.7

## 2020-08-05 MED ORDER — DIPHENHYDRAMINE HCL 25 MG PO CAPS
ORAL_CAPSULE | ORAL | Status: AC
  Filled 2020-08-05: qty 1

## 2020-08-05 MED ORDER — DIPHENHYDRAMINE HCL 25 MG PO CAPS
50.0000 mg | ORAL_CAPSULE | Freq: Once | ORAL | Status: AC
  Administered 2020-08-05: 50 mg via ORAL
  Filled 2020-08-05: qty 2

## 2020-08-05 MED ORDER — DEXAMETHASONE 4 MG PO TABS
20.0000 mg | ORAL_TABLET | Freq: Once | ORAL | Status: AC
  Administered 2020-08-05: 20 mg via ORAL
  Filled 2020-08-05: qty 5

## 2020-08-05 MED ORDER — ACETAMINOPHEN 325 MG PO TABS
650.0000 mg | ORAL_TABLET | Freq: Once | ORAL | Status: AC
  Administered 2020-08-05: 650 mg via ORAL
  Filled 2020-08-05: qty 2

## 2020-08-05 MED ORDER — DARATUMUMAB-HYALURONIDASE-FIHJ 1800-30000 MG-UT/15ML ~~LOC~~ SOLN
1800.0000 mg | Freq: Once | SUBCUTANEOUS | Status: AC
  Administered 2020-08-05: 1800 mg via SUBCUTANEOUS
  Filled 2020-08-05: qty 15

## 2020-08-05 NOTE — Progress Notes (Signed)
Patient has been assessed by Dr. Delton Coombes, labs reviewed, okay to proceed with treatment today.

## 2020-08-05 NOTE — Progress Notes (Signed)
Patient presents today for treatment and follow up visit with Dr. Delton Coombes. Creatinine today 2.10. Patient's normal. Vital signs within parameters for treatment today.   Message received from [name] RN/ Dr. Delton Coombes. Labs reviewed by Dr. Delton Coombes. Proceed with treatment order received. XGeva injection today. Myeloma labs in 2 weeks.   Treatment given today per MD orders. Tolerated without adverse affects. Vital signs stable. No complaints at this time. Discharged from clinic ambulatory. F/U with [facility] as scheduled.

## 2020-08-05 NOTE — Patient Instructions (Signed)

## 2020-08-05 NOTE — Patient Instructions (Signed)
[place] at [facility] Discharge Instructions  You were seen today by Dr. Delton Coombes. He went over your recent results. You received your treatment and injection today; your next treatment will be in 2 weeks. Be vigilant for any additional symptoms of generalized body weakness. Dr. Delton Coombes will see you back in 4 weeks for labs and follow up.   Thank you for choosing [facility] at [facility] to provide your oncology and hematology care.  To afford each patient quality time with our provider, please arrive at least 15 minutes before your scheduled appointment time.   If you have a lab appointment with the [facility] please come in thru the Main Entrance and check in at the main information desk  You need to re-schedule your appointment should you arrive 10 or more minutes late.  We strive to give you quality time with our providers, and arriving late affects you and other patients whose appointments are after yours.  Also, if you no show three or more times for appointments you may be dismissed from the clinic at the providers discretion.     Again, thank you for choosing [facility].  Our hope is that these requests will decrease the amount of time that you wait before being seen by our physicians.       _____________________________________________________________  Should you have questions after your visit to [facility], please contact our office at [phone] between the hours of 8:00 a.m. and 4:30 p.m.  Voicemails left after 4:00 p.m. will not be returned until the following business day.  For prescription refill requests, have your pharmacy contact our office and allow 72 hours.    Cancer Center Support Programs:   > Cancer Support Group  2nd Tuesday of the month 1pm-2pm, Journey Room

## 2020-08-05 NOTE — Progress Notes (Signed)
[place], [place] [address]   CLINIC:  Medical Oncology/Hematology  PCP:  [name], MD [address] [place] [address] [phone]   REASON FOR VISIT:  Follow-up for multiple myeloma  PRIOR THERAPY: Stem cell transplant on 09/30/2017  NGS Results: Not done  CURRENT THERAPY: Darzalex Faspro and Pomalyst  BRIEF ONCOLOGIC HISTORY:  Oncology History  Breast cancer, male ([place])  12/31/2009 Initial Biopsy   Biopsy of L breast    12/31/2009 Pathology Results   Invasive ductal carcinoma, ER/PR+, HER 2 negative   12/31/2009 Imaging   Ultrasound showing a 2.43 x 1.85 x 3 cm hypoechoic spiculated mass in the 12 o clock L breast retroareolar region   01/01/2010 -  Anti-estrogen oral therapy   Tamoxifen 20 mg daily   01/06/2010 Imaging   Bone scan abnormal uptake in the diaphysis of the R humerus, abnormal in the R third, fifth and sixth ribs, lesion also noted in the sternum.   02/03/2010 Surgery   Rod placement and fixation of R humerus by Dr. [name]   02/05/2010 - 02/18/2010 Radiation Therapy   30Gy in 10 fractions of 3 Gy per fraction to R pathologic fracture   03/11/2010 -  Chemotherapy   Denosumab monthly, now every 3 months. Started at [facility]    06/09/2016 Imaging   Three hypermetabolic osseous lesions in the sternum, left ilium and right ilium, as discussed above, likely represent osseous metastases. At this time, these are not recognizable on the CT images. 2. No extra skeletal metastatic disease identified in the neck, chest, abdomen or pelvis.   10/13/2016 Progression   PET shows various new and enlarging osseous metastatic lesions with no definite extra osseous metastatic disease currently identified.    12/31/2016 Progression   1. Multifocal hypermetabolic osseous metastases throughout the axial and proximal appendicular skeleton, which are increased in size, number and metabolism since 10/13/2016 PET-CT. 2. 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 3. No additional sites of hypermetabolic metastatic disease. 4. Chronic right mastoid sinusitis. 5. Aortic atherosclerosis.  One vessel coronary atherosclerosis.   Multiple myeloma ([place])  02/12/2017 Bone Marrow Biopsy   The marrow was variably cellular with large peritrabecular aggregates of kappa restricted plasma cells (66% by aspirate, 30% by Cd138). Cytogenetics +11.    03/01/2017 - 06/29/2017 Chemotherapy   RVD    05/26/2017 Bone Marrow Biopsy   Performed at [facility]:  Plasma cell myeloma in a 30% cellularmarrow with decreased trilineage hematopoiesis and 42% kappalight chain restricted plasma cells on the aspirate smears andlarge aggregates on the core biopsy.    07/12/2017 - 09/01/2017 Chemotherapy   3 cycles of carfizolmib/cyclophosphamide/dexamethasone     10/08/2017 Bone Marrow Transplant   Autotransplant at [facility]   02/28/2020 -  Chemotherapy   The patient had dexamethasone (DECADRON) tablet 20 mg, 20 mg, Oral,  Once, 2 of 2 cycles dexamethasone (DECADRON) tablet 20 mg, 20 mg, Oral, Once, 9 of 11 cycles Administration: 20 mg (03/25/2020) daratumumab-hyaluronidase-fihj (DARZALEX FASPRO) 1800-30000 MG-UT/15ML chemo SQ injection 1,800 mg, 1,800 mg, Subcutaneous,  Once, 9 of 11 cycles Administration: 1,800 mg (02/28/2020), 1,800 mg (03/05/2020), 1,800 mg (03/12/2020), 1,800 mg (03/18/2020), 1,800 mg (03/25/2020), 1,800 mg (04/01/2020), 1,800 mg (04/29/2020), 1,800 mg (04/08/2020), 1,800 mg (04/15/2020), 1,800 mg (04/22/2020), 1,800 mg (05/28/2020), 1,800 mg (06/11/2020), 1,800 mg (06/24/2020), 1,800 mg (07/08/2020), 1,800  mg (07/22/2020) bortezomib SQ (VELCADE) chemo injection 2.75 mg, 1.3 mg/m2 = 2.75 mg, Subcutaneous,  Once, 4 of 4 cycles Administration: 2.75 mg (02/28/2020), 2.75 mg (03/05/2020), 2.75 mg (03/08/2020), 2.75 mg  (03/18/2020), 2.75 mg (03/21/2020), 2.75 mg (03/25/2020), 2.75 mg (03/28/2020), 2.75 mg (04/29/2020), 2.75 mg (05/02/2020), 2.75 mg (05/06/2020), 2.75 mg (05/09/2020), 2.75 mg (04/08/2020), 2.75 mg (04/11/2020), 2.75 mg (04/15/2020), 2.75 mg (04/18/2020)  for chemotherapy treatment.    Multiple myeloma not having achieved remission ([place])  06/29/2017 Initial Diagnosis   Multiple myeloma not having achieved remission ([place])   02/28/2020 -  Chemotherapy   The patient had dexamethasone (DECADRON) tablet 20 mg, 20 mg, Oral,  Once, 2 of 2 cycles dexamethasone (DECADRON) tablet 20 mg, 20 mg, Oral, Once, 9 of 11 cycles Administration: 20 mg (03/25/2020) daratumumab-hyaluronidase-fihj (DARZALEX FASPRO) 1800-30000 MG-UT/15ML chemo SQ injection 1,800 mg, 1,800 mg, Subcutaneous,  Once, 9 of 11 cycles Administration: 1,800 mg (02/28/2020), 1,800 mg (03/05/2020), 1,800 mg (03/12/2020), 1,800 mg (03/18/2020), 1,800 mg (03/25/2020), 1,800 mg (04/01/2020), 1,800 mg (04/29/2020), 1,800 mg (04/08/2020), 1,800 mg (04/15/2020), 1,800 mg (04/22/2020), 1,800 mg (05/28/2020), 1,800 mg (06/11/2020), 1,800 mg (06/24/2020), 1,800 mg (07/08/2020), 1,800 mg (07/22/2020) bortezomib SQ (VELCADE) chemo injection 2.75 mg, 1.3 mg/m2 = 2.75 mg, Subcutaneous,  Once, 4 of 4 cycles Administration: 2.75 mg (02/28/2020), 2.75 mg (03/05/2020), 2.75 mg (03/08/2020), 2.75 mg (03/18/2020), 2.75 mg (03/21/2020), 2.75 mg (03/25/2020), 2.75 mg (03/28/2020), 2.75 mg (04/29/2020), 2.75 mg (05/02/2020), 2.75 mg (05/06/2020), 2.75 mg (05/09/2020), 2.75 mg (04/08/2020), 2.75 mg (04/11/2020), 2.75 mg (04/15/2020), 2.75 mg (04/18/2020)  for chemotherapy treatment.      CANCER STAGING: Cancer Staging Multiple myeloma ([place]) Staging form: Plasma Cell Myeloma and Plasma Cell Disorders, AJCC 8th Edition - Clinical stage from 05/03/2017: Beta-2-microglobulin (mg/L): 3.5, Albumin (g/dL): 3.4, ISS: Stage II, High-risk cytogenetics: Absent, LDH: Not assessed - Signed by Baird Cancer, PA-C on  05/03/2017   INTERVAL HISTORY:  [name], a 74 y.o. male, returns for routine follow-up and consideration for next cycle of chemotherapy. Kayde was last seen on 07/08/2020.  Due for cycle #10 of Darzalex Faspro today.   Overall, today he tells me he has been feeling pretty well. He reports having a dry hacking cough intermittently over the past 3 weeks. He denies having any F/C. He is currently taking Pomalyst and will finish on 8/15. He complains of weakness in both legs without cramping, which is getting worse; he tries to be active as much as possible and denies generalized bodily weakness. He denies having F/C, N/V/D, incontinence and his appetite is good.  Overall, he feels ready for next cycle of chemo today.    REVIEW OF SYSTEMS:  Review of Systems  Constitutional: Positive for appetite change (mildly decreased) and fatigue (mild). Negative for chills and fever.  Respiratory: Positive for cough (dry hacking cough).   Genitourinary: Negative for bladder incontinence.   Neurological: Positive for numbness (+ weakness in bilat legs).  All other systems reviewed and are negative.   PAST MEDICAL/SURGICAL HISTORY:  Past Medical History:  Diagnosis Date  . Anxiety   . Bone metastases ([place]) 09/10/2016  . Breast cancer ([place]) 2011   Stave IV breast cancer; radiation and tamoxifen  . Breast cancer, male ([place])    Stave IV breast cancer; radiation and tamoxifen  Overview:  Left breast ca with mets bone  Overview:  METS TO BONE  . GERD (gastroesophageal reflux disease)   . Hypertension   . Macular degeneration   .  Multiple myeloma ([place]) 02/18/2017  . Peripheral neuropathy    Past Surgical History:  Procedure Laterality Date  . BACK SURGERY    . CATARACT EXTRACTION W/PHACO Left 02/03/2019   Procedure: CATARACT EXTRACTION PHACO AND INTRAOCULAR LENS PLACEMENT ([place]);  Surgeon: Baruch Goldmann, MD;  Location: [facility];  Service: Ophthalmology;  Laterality: Left;  CDE: 15.89  . HERNIA  REPAIR    . PORTACATH PLACEMENT Left 06/10/2018   Procedure: INSERTION PORT-A-CATH;  Surgeon: Virl Cagey, MD;  Location: [facility];  Service: General;  Laterality: Left;  . right arm surgery      SOCIAL HISTORY:  Social History   Socioeconomic History  . Marital status: Married    Spouse name: Not on file  . Number of children: 3  . Years of education: Not on file  . Highest education level: Not on file  Occupational History  . Not on file  Tobacco Use  . Smoking status: Never Smoker  . Smokeless tobacco: Former Network engineer and Sexual Activity  . Alcohol use: Yes    Alcohol/week: 24.0 standard drinks    Types: 24 Cans of beer per week  . Drug use: No  . Sexual activity: Not on file  Other Topics Concern  . Not on file  Social History Narrative  . Not on file   Social Determinants of Health   Financial Resource Strain:   . Difficulty of Paying Living Expenses:   Food Insecurity:   . Worried About Charity fundraiser in the Last Year:   . Arboriculturist in the Last Year:   Transportation Needs:   . Film/video editor (Medical):   Marland Kitchen Lack of Transportation (Non-Medical):   Physical Activity:   . Days of Exercise per Week:   . Minutes of Exercise per Session:   Stress:   . Feeling of Stress :   Social Connections:   . Frequency of Communication with Friends and Family:   . Frequency of Social Gatherings with Friends and Family:   . Attends Religious Services:   . Active Member of Clubs or Organizations:   . Attends Archivist Meetings:   Marland Kitchen Marital Status:   Intimate Partner Violence:   . Fear of Current or Ex-Partner:   . Emotionally Abused:   Marland Kitchen Physically Abused:   . Sexually Abused:     FAMILY HISTORY:  Family History  Problem Relation Age of Onset  . Stroke Mother   . Cancer Maternal Aunt        cancer NOS; died in her 48s  . Lung cancer Maternal Uncle        smoker    CURRENT MEDICATIONS:  Current Outpatient Medications   Medication Sig Dispense Refill  . acyclovir (ZOVIRAX) 800 MG tablet TAKE 1 TABLET TWICE DAILY 180 tablet 3  . amLODipine (NORVASC) 5 MG tablet Take 1 tablet (5 mg total) by mouth daily. 30 tablet 0  . aspirin EC 81 MG tablet Take 81 mg by mouth daily.    . calcium-vitamin D (OSCAL WITH D) 500-200 MG-UNIT TABS tablet TAKE TWO TABLETS BY MOUTH DAILY 60 tablet 6  . Cholecalciferol (VITAMIN D) 50 MCG (2000 UT) CAPS Take 4,000 Units by mouth 2 (two) times daily.    Marland Kitchen DARATUMUMAB-HYALURONIDASE-FIHJ [place] Inject 1,800 mg into the skin every 14 (fourteen) days.     Marland Kitchen denosumab (XGEVA) 120 MG/1.7ML SOLN injection Inject 120 mg into the skin every 30 (thirty) days.     Marland Kitchen  dexamethasone (DECADRON) 4 MG tablet Take 5 pills before each treatment (Patient taking differently: Take 5 pills weekly.) 40 tablet 5  . folic acid (FOLVITE) 1 MG tablet TAKE 1 TABLET BY MOUTH DAILY 90 tablet 2  . gabapentin (NEURONTIN) 300 MG capsule Take 1 capsule (300 mg total) by mouth 3 (three) times daily. 180 capsule 2  . hydrALAZINE (APRESOLINE) 50 MG tablet Take 1 tablet (50 mg total) by mouth every 8 (eight) hours. 90 tablet 0  . loratadine (CLARITIN) 10 MG tablet Take 1 tablet (10 mg total) by mouth daily. 90 tablet 1  . metoprolol succinate (TOPROL-XL) 100 MG 24 hr tablet Take 100 mg by mouth daily.     . Multiple Vitamin (MULTIVITAMIN WITH MINERALS) TABS tablet Take 1 tablet by mouth daily.    . pantoprazole (PROTONIX) 40 MG tablet Take 40 mg by mouth every other day.     . pomalidomide (POMALYST) 2 MG capsule Take 1 capsule (2 mg total) by mouth daily. 21 capsule 0  . tamoxifen (NOLVADEX) 20 MG tablet TAKE 1 TABLET (20 MG TOTAL) BY MOUTH DAILY. 90 tablet 3  . vitamin B-12 (CYANOCOBALAMIN) 1000 MCG tablet Take 1,000 mcg by mouth daily.    Marland Kitchen acetaminophen (TYLENOL) 500 MG tablet Take 500 mg by mouth every 6 (six) hours as needed.  (Patient not taking: Reported on 08/05/2020)    . ALPRAZolam (XANAX) 0.5 MG tablet TAKE 1 TABLET BY  MOUTH AT BEDTIME AS NEEDED FOR ANXIETY OR SLEEP (Patient not taking: Reported on 08/05/2020) 30 tablet 3  . augmented betamethasone dipropionate (DIPROLENE-AF) 0.05 % cream Apply topically as needed. (Patient not taking: Reported on 08/05/2020) 30 g 6  . diclofenac Sodium (VOLTAREN) 1 % GEL Use 1 to 2 times per day as directed by doctor (Patient not taking: Reported on 08/05/2020)    . ondansetron (ZOFRAN) 8 MG tablet TAKE ONE TABLET BY MOUTH EVERY 8 HOURS AS NEEDED (Patient not taking: Reported on 08/05/2020) 30 tablet 2  . polyethylene glycol (MIRALAX / GLYCOLAX) packet Take 17 g by mouth daily as needed for mild constipation. (Patient not taking: Reported on 08/05/2020) 14 each 0  . prochlorperazine (COMPAZINE) 10 MG tablet Take 1 tablet (10 mg total) by mouth every 6 (six) hours as needed for nausea or vomiting. (Patient not taking: Reported on 08/05/2020) 30 tablet 2  . pseudoephedrine-guaifenesin (MUCINEX D) 60-600 MG 12 hr tablet Take 1 tablet by mouth 2 (two) times daily as needed for congestion.  (Patient not taking: Reported on 08/05/2020)     No current facility-administered medications for this visit.    ALLERGIES:  Allergies  Allergen Reactions  . Cefepime     Suspected severe thrombocytopenia is a result of cefepime induced antigen platelet destruction    PHYSICAL EXAM:  Performance status (ECOG): 1 - Symptomatic but completely ambulatory  Vitals:   08/05/20 0938  BP: 140/67  Pulse: 66  Resp: 18  Temp: (!) 97.5 F (36.4 C)  SpO2: 96%   Wt Readings from Last 3 Encounters:  08/05/20 206 lb 12.8 oz (93.8 kg)  07/22/20 (!) 206 lb 12.8 oz (93.8 kg)  07/08/20 210 lb 6.4 oz (95.4 kg)   Physical Exam Vitals reviewed.  Constitutional:      Appearance: Normal appearance. He is obese.  Cardiovascular:     Rate and Rhythm: Normal rate and regular rhythm.     Pulses: Normal pulses.     Heart sounds: Normal heart sounds.  Pulmonary:  Effort: Pulmonary effort is normal.     Breath  sounds: Normal breath sounds.  Musculoskeletal:     Right lower leg: No edema.     Left lower leg: No edema.  Neurological:     General: No focal deficit present.     Mental Status: He is alert and oriented to person, place, and time.  Psychiatric:        Mood and Affect: Mood normal.        Behavior: Behavior normal.     LABORATORY DATA:  I have reviewed the labs as listed.  CBC Latest Ref Rng & Units 08/05/2020 07/22/2020 07/08/2020  WBC 4.0 - 10.5 K/uL 5.4 4.2 5.8  Hemoglobin 13.0 - 17.0 g/dL 10.1(L) 10.7(L) 10.2(L)  Hematocrit 39 - 52 % 31.3(L) 32.4(L) 31.8(L)  Platelets 150 - 400 K/uL 186 278 146(L)   CMP Latest Ref Rng & Units 08/05/2020 07/22/2020 07/08/2020  Glucose 70 - 99 mg/dL 102(H) 130(H) 112(H)  BUN 8 - 23 mg/dL _0 Creatinine 0.61 - 1.24 mg/dL 2.10(H) 1.77(H) 1.60(H)  Sodium 135 - 145 mmol/L 137 136 138  Potassium 3.5 - 5.1 mmol/L 4.0 4.3 4.5  Chloride 98 - 111 mmol/L 102 101 107  CO2 22 - 32 mmol/L _1 Calcium 8.9 - 10.3 mg/dL 10.0 9.1 8.5(L)  Total Protein 6.5 - 8.1 g/dL 5.7(L) 6.2(L) 5.5(L)  Total Bilirubin 0.3 - 1.2 mg/dL 0.6 0.3 0.4  Alkaline Phos 38 - 126 U/L 26(L) 31(L) 27(L)  AST 15 - 41 U/L 10(L) 15 13(L)  ALT 0 - 44 U/L _2 Lab Results  Component Value Date   LDH 107 08/05/2020   LDH 159 07/22/2020   LDH 139 07/08/2020   Lab Results  Component Value Date   TOTALPROTELP 5.4 (L) 07/22/2020   ALBUMINELP 3.3 07/22/2020   A1GS 0.3 07/22/2020   A2GS 0.7 07/22/2020   BETS 0.8 07/22/2020   GAMS 0.3 (L) 07/22/2020   MSPIKE 0.1 (H) 07/22/2020   SPEI Comment 07/22/2020    Lab Results  Component Value Date   KPAFRELGTCHN 61.2 (H) 07/22/2020   LAMBDASER 2.3 (L) 07/22/2020   KAPLAMBRATIO 26.61 (H) 07/22/2020    DIAGNOSTIC IMAGING:  I have independently reviewed the scans and discussed with the patient. No results found.   ASSESSMENT:  1. Relapsed multiple myeloma: -Autologous stem cell transplant on 10/08/2017 -Post transplant  consolidation withCyCarD for 2 cycles with M spike undetectable. -Maintenance therapy with carfilzomib 70 mg per metered square days 1 and 15 every 28 days from 04/20/2018, dose reduced to 35 mg per metered square on 01/05/2019, titrated up to 56 mg per metered square on 01/09/2020. 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 -PET scan on 02/18/2020 showed new left supraclavicular lymph node at the thoracic inlet, SUV 14.2. Multifocal osseous lesions largely improved, though residual lesions noted including right acromion, left acromion, thyroid cartilage, right sternum. Additional lesions throughout the sternum, bilateral ribs, thoracolumbar spine, bilateral pelvis have resolved. -BMBX on 02/14/2020 shows plasma cell myeloma, 60-70% of cells. FISH for myeloma was negative. Cytogenetics was normal. -4 cycles of daratumumab, bortezomib and dexamethasone from 02/28/2020 through 04/29/2020. -Daratumumab, pomalidomide and dexamethasone started on 05/28/2020. He is receiving daratumumab every 2 weeks, Pomalyst 3 mg days 1-21, dexamethasone 20 mg weekly. -Pomalidomide dose reduced to 2 mg days 1-21 around the first week of July 2021, due to severe weakness.   PLAN:  1. Relapsed IgG kappa  multiple myeloma: -He is continuing pomalidomide and will finish on 08/11/2020. -He is tolerating Darzalex every 2 weeks very well.  He had some mild leg weakness.  However strength in the legs is normal. -I reviewed myeloma panel from 07/22/2020.  M spike improved to 0.1.  Kappa light chains improved to 61 and ratio improved to 26. -I reviewed labs from today which are adequate to proceed with Darzalex.  We will reassess him in 1 month.  2. Myeloma bone disease: -Calcium is 10.0.  Continue monthly denosumab.  3. Macrocytic anemia: -Continue Procrit if hemoglobin less than 9.  4. ID prophylaxis: -Continue acyclovir twice daily.  5. Peripheral neuropathy: -Continue  gabapentin 300 mg 3 times daily.  6. History of breast cancer: -Continue tamoxifen daily.   Orders placed this encounter:  No orders of the defined types were placed in this encounter.    Derek Jack, MD [place] [phone]   I, Milinda Antis, am acting as a scribe for Dr. Sanda Linger.  I, Derek Jack MD, have reviewed the above documentation for accuracy and completeness, and I agree with the above.

## 2020-08-06 ENCOUNTER — Inpatient Hospital Stay (HOSPITAL_COMMUNITY): Payer: Medicare Other

## 2020-08-06 VITALS — BP 145/72 | HR 68 | Temp 97.5°F | Resp 18

## 2020-08-06 DIAGNOSIS — D509 Iron deficiency anemia, unspecified: Secondary | ICD-10-CM | POA: Diagnosis not present

## 2020-08-06 DIAGNOSIS — C7951 Secondary malignant neoplasm of bone: Secondary | ICD-10-CM

## 2020-08-06 DIAGNOSIS — N179 Acute kidney failure, unspecified: Secondary | ICD-10-CM

## 2020-08-06 DIAGNOSIS — C9000 Multiple myeloma not having achieved remission: Secondary | ICD-10-CM

## 2020-08-06 DIAGNOSIS — Z79810 Long term (current) use of selective estrogen receptor modulators (SERMs): Secondary | ICD-10-CM | POA: Diagnosis not present

## 2020-08-06 DIAGNOSIS — Z5111 Encounter for antineoplastic chemotherapy: Secondary | ICD-10-CM | POA: Diagnosis not present

## 2020-08-06 DIAGNOSIS — C50922 Malignant neoplasm of unspecified site of left male breast: Secondary | ICD-10-CM | POA: Diagnosis not present

## 2020-08-06 LAB — KAPPA/LAMBDA LIGHT CHAINS
Kappa free light chain: 41.4 mg/L — ABNORMAL HIGH (ref 3.3–19.4)
Kappa, lambda light chain ratio: 14.79 — ABNORMAL HIGH (ref 0.26–1.65)
Lambda free light chains: 2.8 mg/L — ABNORMAL LOW (ref 5.7–26.3)

## 2020-08-06 LAB — PROTEIN ELECTROPHORESIS, SERUM
A/G Ratio: 1.3 (ref 0.7–1.7)
Albumin ELP: 2.8 g/dL — ABNORMAL LOW (ref 2.9–4.4)
Alpha-1-Globulin: 0.3 g/dL (ref 0.0–0.4)
Alpha-2-Globulin: 0.8 g/dL (ref 0.4–1.0)
Beta Globulin: 0.8 g/dL (ref 0.7–1.3)
Gamma Globulin: 0.3 g/dL — ABNORMAL LOW (ref 0.4–1.8)
Globulin, Total: 2.2 g/dL (ref 2.2–3.9)
M-Spike, %: 0.1 g/dL — ABNORMAL HIGH
Total Protein ELP: 5 g/dL — ABNORMAL LOW (ref 6.0–8.5)

## 2020-08-06 MED ORDER — DENOSUMAB 120 MG/1.7ML ~~LOC~~ SOLN
SUBCUTANEOUS | Status: AC
  Filled 2020-08-06: qty 1.7

## 2020-08-06 MED ORDER — DENOSUMAB 120 MG/1.7ML ~~LOC~~ SOLN
120.0000 mg | Freq: Once | SUBCUTANEOUS | Status: AC
  Administered 2020-08-06: 120 mg via SUBCUTANEOUS

## 2020-08-06 NOTE — Patient Instructions (Signed)
[facility] at [facility]  Discharge Instructions:   _______________________________________________________________  Thank you for choosing [facility] at [facility] to provide your oncology and hematology care.  To afford each patient quality time with our providers, please arrive at least 15 minutes before your scheduled appointment.  You need to re-schedule your appointment if you arrive 10 or more minutes late.  We strive to give you quality time with our providers, and arriving late affects you and other patients whose appointments are after yours.  Also, if you no show three or more times for appointments you may be dismissed from the clinic.  Again, thank you for choosing [facility] at [facility]. Our hope is that these requests will allow you access to exceptional care and in a timely manner. _______________________________________________________________  If you have questions after your visit, please contact our office at [phone] between the hours of 8:30 a.m. and 5:00 p.m. Voicemails left after 4:30 p.m. will not be returned until the following business day. _______________________________________________________________  For prescription refill requests, have your pharmacy contact our office. _______________________________________________________________  Recommendations made by the consultant and any test results will be sent to your referring physician. _______________________________________________________________ 

## 2020-08-06 NOTE — Progress Notes (Signed)
Johnny Lucas presents today for injection per MD orders. Patient taking Calcium/ Vitamin D- Oscal  daily. Patient denies any jaw pain or upcoming dental work. Calcium 10.0 08/05/20.  XGeva administered SQ in left Upper Arm. Administration without incident. Patient tolerated well. No complaints at this time. Discharged from clinic ambulatory. F/U with [facility] as scheduled.

## 2020-08-07 ENCOUNTER — Other Ambulatory Visit (HOSPITAL_COMMUNITY): Payer: Self-pay | Admitting: Hematology

## 2020-08-07 DIAGNOSIS — C9000 Multiple myeloma not having achieved remission: Secondary | ICD-10-CM

## 2020-08-15 ENCOUNTER — Other Ambulatory Visit (HOSPITAL_COMMUNITY): Payer: Self-pay

## 2020-08-15 DIAGNOSIS — C50021 Malignant neoplasm of nipple and areola, right male breast: Secondary | ICD-10-CM

## 2020-08-15 NOTE — Progress Notes (Signed)
Patient called the clinic reporting recent fatigue. Patient requesting fluids. Orders placed for labs tomorrow and appt for fluids made pending lab work per Dr. Delton Coombes.

## 2020-08-16 ENCOUNTER — Inpatient Hospital Stay (HOSPITAL_COMMUNITY): Payer: Medicare Other

## 2020-08-16 ENCOUNTER — Encounter (HOSPITAL_COMMUNITY): Payer: Self-pay

## 2020-08-16 VITALS — BP 128/66 | HR 61 | Temp 97.0°F | Resp 18 | Wt 204.0 lb

## 2020-08-16 DIAGNOSIS — C50021 Malignant neoplasm of nipple and areola, right male breast: Secondary | ICD-10-CM

## 2020-08-16 DIAGNOSIS — C9000 Multiple myeloma not having achieved remission: Secondary | ICD-10-CM | POA: Diagnosis not present

## 2020-08-16 DIAGNOSIS — D509 Iron deficiency anemia, unspecified: Secondary | ICD-10-CM | POA: Diagnosis not present

## 2020-08-16 DIAGNOSIS — Z79810 Long term (current) use of selective estrogen receptor modulators (SERMs): Secondary | ICD-10-CM | POA: Diagnosis not present

## 2020-08-16 DIAGNOSIS — C50922 Malignant neoplasm of unspecified site of left male breast: Secondary | ICD-10-CM | POA: Diagnosis not present

## 2020-08-16 DIAGNOSIS — C7951 Secondary malignant neoplasm of bone: Secondary | ICD-10-CM

## 2020-08-16 DIAGNOSIS — Z5111 Encounter for antineoplastic chemotherapy: Secondary | ICD-10-CM | POA: Diagnosis not present

## 2020-08-16 DIAGNOSIS — E860 Dehydration: Secondary | ICD-10-CM

## 2020-08-16 LAB — COMPREHENSIVE METABOLIC PANEL
ALT: 13 U/L (ref 0–44)
AST: 12 U/L — ABNORMAL LOW (ref 15–41)
Albumin: 3.3 g/dL — ABNORMAL LOW (ref 3.5–5.0)
Alkaline Phosphatase: 29 U/L — ABNORMAL LOW (ref 38–126)
Anion gap: 8 (ref 5–15)
BUN: 31 mg/dL — ABNORMAL HIGH (ref 8–23)
CO2: 28 mmol/L (ref 22–32)
Calcium: 12.4 mg/dL — ABNORMAL HIGH (ref 8.9–10.3)
Chloride: 99 mmol/L (ref 98–111)
Creatinine, Ser: 3.2 mg/dL — ABNORMAL HIGH (ref 0.61–1.24)
GFR calc Af Amer: 21 mL/min — ABNORMAL LOW (ref >60)
GFR calc non Af Amer: 18 mL/min — ABNORMAL LOW (ref >60)
Glucose, Bld: 118 mg/dL — ABNORMAL HIGH (ref 70–99)
Potassium: 4.1 mmol/L (ref 3.5–5.1)
Sodium: 135 mmol/L (ref 135–145)
Total Bilirubin: 0.7 mg/dL (ref 0.3–1.2)
Total Protein: 5.6 g/dL — ABNORMAL LOW (ref 6.5–8.1)

## 2020-08-16 LAB — CBC WITH DIFFERENTIAL/PLATELET
Abs Immature Granulocytes: 0.02 10*3/uL (ref 0.00–0.07)
Basophils Absolute: 0.1 10*3/uL (ref 0.0–0.1)
Basophils Relative: 2 %
Eosinophils Absolute: 0.4 10*3/uL (ref 0.0–0.5)
Eosinophils Relative: 6 %
HCT: 31.4 % — ABNORMAL LOW (ref 39.0–52.0)
Hemoglobin: 10.3 g/dL — ABNORMAL LOW (ref 13.0–17.0)
Immature Granulocytes: 0 %
Lymphocytes Relative: 15 %
Lymphs Abs: 1.1 10*3/uL (ref 0.7–4.0)
MCH: 33.7 pg (ref 26.0–34.0)
MCHC: 32.8 g/dL (ref 30.0–36.0)
MCV: 102.6 fL — ABNORMAL HIGH (ref 80.0–100.0)
Monocytes Absolute: 1 10*3/uL (ref 0.1–1.0)
Monocytes Relative: 14 %
Neutro Abs: 4.4 10*3/uL (ref 1.7–7.7)
Neutrophils Relative %: 63 %
Platelets: 232 10*3/uL (ref 150–400)
RBC: 3.06 MIL/uL — ABNORMAL LOW (ref 4.22–5.81)
RDW: 14.6 % (ref 11.5–15.5)
WBC: 7 10*3/uL (ref 4.0–10.5)
nRBC: 0 % (ref 0.0–0.2)

## 2020-08-16 LAB — MAGNESIUM: Magnesium: 2 mg/dL (ref 1.7–2.4)

## 2020-08-16 MED ORDER — ONDANSETRON HCL 4 MG/2ML IJ SOLN
8.0000 mg | Freq: Once | INTRAMUSCULAR | Status: AC
  Administered 2020-08-16: 8 mg via INTRAVENOUS

## 2020-08-16 MED ORDER — SODIUM CHLORIDE 0.9 % IV SOLN
Freq: Once | INTRAVENOUS | Status: AC
  Filled 2020-08-16: qty 1000

## 2020-08-16 MED ORDER — ONDANSETRON HCL 4 MG/2ML IJ SOLN
INTRAMUSCULAR | Status: AC
  Filled 2020-08-16: qty 4

## 2020-08-16 MED ORDER — HEPARIN SOD (PORK) LOCK FLUSH 100 UNIT/ML IV SOLN
500.0000 [IU] | Freq: Once | INTRAVENOUS | Status: AC | PRN
  Administered 2020-08-16: 500 [IU]

## 2020-08-16 MED ORDER — SODIUM CHLORIDE 0.9 % IV SOLN
8.0000 mg | Freq: Once | INTRAVENOUS | Status: AC
  Filled 2020-08-16: qty 4

## 2020-08-16 MED ORDER — SODIUM CHLORIDE 0.9% FLUSH: 10.0000 mL | Freq: Once | INTRAVENOUS | Status: AC | PRN

## 2020-08-16 NOTE — Patient Instructions (Signed)
[facility] at [facility]  Discharge Instructions:   _______________________________________________________________  Thank you for choosing [facility] at [facility] to provide your oncology and hematology care.  To afford each patient quality time with our providers, please arrive at least 15 minutes before your scheduled appointment.  You need to re-schedule your appointment if you arrive 10 or more minutes late.  We strive to give you quality time with our providers, and arriving late affects you and other patients whose appointments are after yours.  Also, if you no show three or more times for appointments you may be dismissed from the clinic.  Again, thank you for choosing [facility] at [facility]. Our hope is that these requests will allow you access to exceptional care and in a timely manner. _______________________________________________________________  If you have questions after your visit, please contact our office at [phone] between the hours of 8:30 a.m. and 5:00 p.m. Voicemails left after 4:30 p.m. will not be returned until the following business day. _______________________________________________________________  For prescription refill requests, have your pharmacy contact our office. _______________________________________________________________  Recommendations made by the consultant and any test results will be sent to your referring physician. _______________________________________________________________ 

## 2020-08-16 NOTE — Progress Notes (Signed)
Patient presented today for labs and hydration fluids. Patient states he feels weak, not able to eat and drink as much as he usually does. He does have some swelling in his bilateral lower legs. He just feels bad. NP to room to evaluate. Labs reviewed with Nurse Practitioner Francene Finders, will continue as planned with fluids and nausea medication per orders.    Vitals stable and discharged home from clinic via wheelchair. Follow up as scheduled.

## 2020-08-17 ENCOUNTER — Other Ambulatory Visit (HOSPITAL_COMMUNITY): Payer: Self-pay | Admitting: Hematology

## 2020-08-19 ENCOUNTER — Inpatient Hospital Stay (HOSPITAL_COMMUNITY): Payer: Medicare Other

## 2020-08-19 ENCOUNTER — Other Ambulatory Visit: Payer: Self-pay

## 2020-08-19 VITALS — BP 157/70 | HR 67 | Temp 97.0°F | Resp 18

## 2020-08-19 DIAGNOSIS — N179 Acute kidney failure, unspecified: Secondary | ICD-10-CM

## 2020-08-19 DIAGNOSIS — C7951 Secondary malignant neoplasm of bone: Secondary | ICD-10-CM | POA: Diagnosis not present

## 2020-08-19 DIAGNOSIS — C9000 Multiple myeloma not having achieved remission: Secondary | ICD-10-CM | POA: Diagnosis not present

## 2020-08-19 DIAGNOSIS — D509 Iron deficiency anemia, unspecified: Secondary | ICD-10-CM | POA: Diagnosis not present

## 2020-08-19 DIAGNOSIS — C50922 Malignant neoplasm of unspecified site of left male breast: Secondary | ICD-10-CM | POA: Diagnosis not present

## 2020-08-19 DIAGNOSIS — E860 Dehydration: Secondary | ICD-10-CM

## 2020-08-19 DIAGNOSIS — Z5111 Encounter for antineoplastic chemotherapy: Secondary | ICD-10-CM | POA: Diagnosis not present

## 2020-08-19 DIAGNOSIS — Z79810 Long term (current) use of selective estrogen receptor modulators (SERMs): Secondary | ICD-10-CM | POA: Diagnosis not present

## 2020-08-19 LAB — COMPREHENSIVE METABOLIC PANEL
ALT: 16 U/L (ref 0–44)
AST: 13 U/L — ABNORMAL LOW (ref 15–41)
Albumin: 3.4 g/dL — ABNORMAL LOW (ref 3.5–5.0)
Alkaline Phosphatase: 30 U/L — ABNORMAL LOW (ref 38–126)
Anion gap: 9 (ref 5–15)
BUN: 28 mg/dL — ABNORMAL HIGH (ref 8–23)
CO2: 28 mmol/L (ref 22–32)
Calcium: 12.5 mg/dL — ABNORMAL HIGH (ref 8.9–10.3)
Chloride: 97 mmol/L — ABNORMAL LOW (ref 98–111)
Creatinine, Ser: 3.44 mg/dL — ABNORMAL HIGH (ref 0.61–1.24)
GFR calc Af Amer: 19 mL/min — ABNORMAL LOW (ref >60)
GFR calc non Af Amer: 17 mL/min — ABNORMAL LOW (ref >60)
Glucose, Bld: 111 mg/dL — ABNORMAL HIGH (ref 70–99)
Potassium: 3.8 mmol/L (ref 3.5–5.1)
Sodium: 134 mmol/L — ABNORMAL LOW (ref 135–145)
Total Bilirubin: 0.6 mg/dL (ref 0.3–1.2)
Total Protein: 6 g/dL — ABNORMAL LOW (ref 6.5–8.1)

## 2020-08-19 LAB — MAGNESIUM: Magnesium: 1.9 mg/dL (ref 1.7–2.4)

## 2020-08-19 LAB — BRAIN NATRIURETIC PEPTIDE: B Natriuretic Peptide: 163 pg/mL — ABNORMAL HIGH (ref 0.0–100.0)

## 2020-08-19 LAB — LACTATE DEHYDROGENASE: LDH: 129 U/L (ref 98–192)

## 2020-08-19 MED ORDER — DENOSUMAB 120 MG/1.7ML ~~LOC~~ SOLN
120.0000 mg | Freq: Once | SUBCUTANEOUS | Status: AC
  Administered 2020-08-19: 120 mg via SUBCUTANEOUS

## 2020-08-19 MED ORDER — EPOETIN ALFA-EPBX 10000 UNIT/ML IJ SOLN: 20000.0000 [IU] | Freq: Once | INTRAMUSCULAR | Status: DC

## 2020-08-19 MED ORDER — SODIUM CHLORIDE 0.9 % IV SOLN: INTRAVENOUS | Status: AC

## 2020-08-19 MED ORDER — HEPARIN SOD (PORK) LOCK FLUSH 100 UNIT/ML IV SOLN
500.0000 [IU] | Freq: Once | INTRAVENOUS | Status: AC
  Administered 2020-08-19: 500 [IU] via INTRAVENOUS

## 2020-08-19 MED ORDER — DARATUMUMAB-HYALURONIDASE-FIHJ 1800-30000 MG-UT/15ML ~~LOC~~ SOLN
1800.0000 mg | Freq: Once | SUBCUTANEOUS | Status: AC
  Administered 2020-08-19: 1800 mg via SUBCUTANEOUS
  Filled 2020-08-19: qty 15

## 2020-08-19 NOTE — Patient Instructions (Signed)
[facility] °Discharge Instructions for Patients Receiving Chemotherapy ° °Today you received the following chemotherapy agents  ° °To help prevent nausea and vomiting after your treatment, we encourage you to take your nausea medication  °  °If you develop nausea and vomiting that is not controlled by your nausea medication, call the clinic.  ° °BELOW ARE SYMPTOMS THAT SHOULD BE REPORTED IMMEDIATELY: °· *FEVER GREATER THAN 100.5 F °· *CHILLS WITH OR WITHOUT FEVER °· NAUSEA AND VOMITING THAT IS NOT CONTROLLED WITH YOUR NAUSEA MEDICATION °· *UNUSUAL SHORTNESS OF BREATH °· *UNUSUAL BRUISING OR BLEEDING °· TENDERNESS IN MOUTH AND THROAT WITH OR WITHOUT PRESENCE OF ULCERS °· *URINARY PROBLEMS °· *BOWEL PROBLEMS °· UNUSUAL RASH °Items with * indicate a potential emergency and should be followed up as soon as possible. ° °Feel free to call the clinic should you have any questions or concerns. The clinic phone number is [phone]. ° °Please show the CHEMO ALERT CARD at check-in to the Emergency Department and triage nurse. ° ° °

## 2020-08-19 NOTE — Progress Notes (Signed)
Per Dr. Delton Coombes, calcium 12.5 today, give Xgeva today and 1 liter of normal saline over 2 hours.  Primary RN and pharmacy aware.

## 2020-08-19 NOTE — Progress Notes (Signed)
Johnny Lucas today for hypercalcemia with calcium 12.5  T.O. Dr Rhys Martini PharmD

## 2020-08-19 NOTE — Progress Notes (Signed)
Labs reviewed with MD today. Creatinine 3.44. Bun 28. Proceed as planned per MD.   Patient is taking pomalyst and has not missed any doses and reports no side effects at this time.   Will give xgeva today due to elevated calcium level and hydration fluids per MD. Patient and wife instructed to hold calcium per MD. Will hold till next Monday the 30 th

## 2020-08-19 NOTE — Progress Notes (Signed)
Patient tolerated hydration with no complaints voiced.  Port site clean and dry with good blood return noted before and after hydration.  No bruising or swelling noted with port.  Band aid applied.  VSS with discharge and left ambulatory with no s/s of distress noted.   

## 2020-08-20 ENCOUNTER — Other Ambulatory Visit: Payer: Self-pay

## 2020-08-20 ENCOUNTER — Inpatient Hospital Stay (HOSPITAL_COMMUNITY): Payer: Medicare Other

## 2020-08-20 VITALS — BP 153/75 | HR 64 | Temp 97.2°F | Resp 18

## 2020-08-20 DIAGNOSIS — Z79810 Long term (current) use of selective estrogen receptor modulators (SERMs): Secondary | ICD-10-CM | POA: Diagnosis not present

## 2020-08-20 DIAGNOSIS — C9000 Multiple myeloma not having achieved remission: Secondary | ICD-10-CM | POA: Diagnosis not present

## 2020-08-20 DIAGNOSIS — C7951 Secondary malignant neoplasm of bone: Secondary | ICD-10-CM | POA: Diagnosis not present

## 2020-08-20 DIAGNOSIS — C50922 Malignant neoplasm of unspecified site of left male breast: Secondary | ICD-10-CM | POA: Diagnosis not present

## 2020-08-20 DIAGNOSIS — Z5111 Encounter for antineoplastic chemotherapy: Secondary | ICD-10-CM | POA: Diagnosis not present

## 2020-08-20 DIAGNOSIS — R7989 Other specified abnormal findings of blood chemistry: Secondary | ICD-10-CM

## 2020-08-20 DIAGNOSIS — D509 Iron deficiency anemia, unspecified: Secondary | ICD-10-CM | POA: Diagnosis not present

## 2020-08-20 DIAGNOSIS — E860 Dehydration: Secondary | ICD-10-CM

## 2020-08-20 LAB — BASIC METABOLIC PANEL
Anion gap: 8 (ref 5–15)
BUN: 34 mg/dL — ABNORMAL HIGH (ref 8–23)
CO2: 26 mmol/L (ref 22–32)
Calcium: 11 mg/dL — ABNORMAL HIGH (ref 8.9–10.3)
Chloride: 100 mmol/L (ref 98–111)
Creatinine, Ser: 3.35 mg/dL — ABNORMAL HIGH (ref 0.61–1.24)
GFR calc Af Amer: 20 mL/min — ABNORMAL LOW (ref >60)
GFR calc non Af Amer: 17 mL/min — ABNORMAL LOW (ref >60)
Glucose, Bld: 129 mg/dL — ABNORMAL HIGH (ref 70–99)
Potassium: 3.6 mmol/L (ref 3.5–5.1)
Sodium: 134 mmol/L — ABNORMAL LOW (ref 135–145)

## 2020-08-20 LAB — KAPPA/LAMBDA LIGHT CHAINS
Kappa free light chain: 28.8 mg/L — ABNORMAL HIGH (ref 3.3–19.4)
Kappa, lambda light chain ratio: 3.35 — ABNORMAL HIGH (ref 0.26–1.65)
Lambda free light chains: 8.6 mg/L (ref 5.7–26.3)

## 2020-08-20 LAB — PROTEIN ELECTROPHORESIS, SERUM
A/G Ratio: 1.2 (ref 0.7–1.7)
Albumin ELP: 2.9 g/dL (ref 2.9–4.4)
Alpha-1-Globulin: 0.4 g/dL (ref 0.0–0.4)
Alpha-2-Globulin: 0.9 g/dL (ref 0.4–1.0)
Beta Globulin: 0.9 g/dL (ref 0.7–1.3)
Gamma Globulin: 0.3 g/dL — ABNORMAL LOW (ref 0.4–1.8)
Globulin, Total: 2.4 g/dL (ref 2.2–3.9)
M-Spike, %: 0.1 g/dL — ABNORMAL HIGH
Total Protein ELP: 5.3 g/dL — ABNORMAL LOW (ref 6.0–8.5)

## 2020-08-20 MED ORDER — SODIUM CHLORIDE 0.9% FLUSH
10.0000 mL | Freq: Once | INTRAVENOUS | Status: AC | PRN
  Administered 2020-08-20: 10 mL

## 2020-08-20 MED ORDER — HEPARIN SOD (PORK) LOCK FLUSH 100 UNIT/ML IV SOLN
500.0000 [IU] | Freq: Once | INTRAVENOUS | Status: AC | PRN
  Administered 2020-08-20: 500 [IU]

## 2020-08-20 MED ORDER — SODIUM CHLORIDE 0.9 % IV SOLN: INTRAVENOUS | Status: AC

## 2020-08-20 NOTE — Patient Instructions (Signed)
[facility]  Discharge Instructions:   _______________________________________________________________  Thank you for choosing [facility] at [facility] to provide your oncology and hematology care.  To afford each patient quality time with our providers, please arrive at least 15 minutes before your scheduled appointment.  You need to re-schedule your appointment if you arrive 10 or more minutes late.  We strive to give you quality time with our providers, and arriving late affects you and other patients whose appointments are after yours.  Also, if you no show three or more times for appointments you may be dismissed from the clinic.  Again, thank you for choosing  [facility]. Our hope is that these requests will allow you access to exceptional care and in a timely manner. _______________________________________________________________  If you have questions after your visit, please contact our office at [phone] between the hours of 8:30 a.m. and 5:00 p.m. Voicemails left after 4:30 p.m. will not be returned until the following business day. _______________________________________________________________  For prescription refill requests, have your pharmacy contact our office. _______________________________________________________________  Recommendations made by the consultant and any test results will be sent to your referring physician. _______________________________________________________________ 

## 2020-08-20 NOTE — Progress Notes (Signed)
Labs reviewed with MD today. Calcium 11.0. will give one liter of plain normal saline over 2 hours per MD. Patient states he feels better today.   Vitals stable and discharged home from clinic ambulatory. Follow up as scheduled.

## 2020-08-25 ENCOUNTER — Other Ambulatory Visit (HOSPITAL_COMMUNITY): Payer: Self-pay | Admitting: Hematology

## 2020-08-25 DIAGNOSIS — I10 Essential (primary) hypertension: Secondary | ICD-10-CM

## 2020-08-25 DIAGNOSIS — G4700 Insomnia, unspecified: Secondary | ICD-10-CM

## 2020-08-26 ENCOUNTER — Inpatient Hospital Stay (HOSPITAL_COMMUNITY): Payer: Medicare Other | Attending: Hematology

## 2020-08-26 ENCOUNTER — Other Ambulatory Visit: Payer: Self-pay

## 2020-08-26 ENCOUNTER — Inpatient Hospital Stay (HOSPITAL_COMMUNITY): Payer: Medicare Other

## 2020-08-26 DIAGNOSIS — C7951 Secondary malignant neoplasm of bone: Secondary | ICD-10-CM | POA: Diagnosis not present

## 2020-08-26 DIAGNOSIS — D509 Iron deficiency anemia, unspecified: Secondary | ICD-10-CM | POA: Diagnosis not present

## 2020-08-26 DIAGNOSIS — C50922 Malignant neoplasm of unspecified site of left male breast: Secondary | ICD-10-CM | POA: Diagnosis not present

## 2020-08-26 DIAGNOSIS — C9000 Multiple myeloma not having achieved remission: Secondary | ICD-10-CM | POA: Diagnosis not present

## 2020-08-26 DIAGNOSIS — Z79810 Long term (current) use of selective estrogen receptor modulators (SERMs): Secondary | ICD-10-CM | POA: Diagnosis not present

## 2020-08-26 DIAGNOSIS — Z5111 Encounter for antineoplastic chemotherapy: Secondary | ICD-10-CM | POA: Diagnosis not present

## 2020-08-26 LAB — COMPREHENSIVE METABOLIC PANEL
ALT: 29 U/L (ref 0–44)
AST: 17 U/L (ref 15–41)
Albumin: 3.6 g/dL (ref 3.5–5.0)
Alkaline Phosphatase: 29 U/L — ABNORMAL LOW (ref 38–126)
Anion gap: 8 (ref 5–15)
BUN: 16 mg/dL (ref 8–23)
CO2: 24 mmol/L (ref 22–32)
Calcium: 8.6 mg/dL — ABNORMAL LOW (ref 8.9–10.3)
Chloride: 105 mmol/L (ref 98–111)
Creatinine, Ser: 2.28 mg/dL — ABNORMAL HIGH (ref 0.61–1.24)
GFR calc Af Amer: 32 mL/min — ABNORMAL LOW (ref >60)
GFR calc non Af Amer: 28 mL/min — ABNORMAL LOW (ref >60)
Glucose, Bld: 132 mg/dL — ABNORMAL HIGH (ref 70–99)
Potassium: 3.7 mmol/L (ref 3.5–5.1)
Sodium: 137 mmol/L (ref 135–145)
Total Bilirubin: 0.5 mg/dL (ref 0.3–1.2)
Total Protein: 5.9 g/dL — ABNORMAL LOW (ref 6.5–8.1)

## 2020-08-26 NOTE — Progress Notes (Signed)
Pt here for fluids.  Calcium improved from previous treatment to 8.6.  Calcium supplements still on hold.  Creatinine 2.28, which is improved.  Labs reviewed with Dr Raliegh Ip.  No fluids today.  Still hold calcium supplements.  Went over all medications with patient.

## 2020-08-29 NOTE — Telephone Encounter (Signed)
Chart reviewed. Pomalyst refilled per Dr. Tomie China last office note.

## 2020-09-03 ENCOUNTER — Encounter (HOSPITAL_COMMUNITY): Payer: Self-pay | Admitting: Hematology

## 2020-09-03 ENCOUNTER — Inpatient Hospital Stay (HOSPITAL_COMMUNITY): Payer: Medicare Other | Attending: Hematology

## 2020-09-03 ENCOUNTER — Other Ambulatory Visit: Payer: Self-pay

## 2020-09-03 ENCOUNTER — Inpatient Hospital Stay (HOSPITAL_BASED_OUTPATIENT_CLINIC_OR_DEPARTMENT_OTHER): Payer: Medicare Other | Admitting: Hematology

## 2020-09-03 ENCOUNTER — Inpatient Hospital Stay (HOSPITAL_COMMUNITY): Payer: Medicare Other

## 2020-09-03 VITALS — BP 144/68 | HR 63 | Temp 97.1°F | Resp 18 | Wt 204.0 lb

## 2020-09-03 DIAGNOSIS — Z170 Estrogen receptor positive status [ER+]: Secondary | ICD-10-CM | POA: Diagnosis not present

## 2020-09-03 DIAGNOSIS — Z923 Personal history of irradiation: Secondary | ICD-10-CM | POA: Insufficient documentation

## 2020-09-03 DIAGNOSIS — F419 Anxiety disorder, unspecified: Secondary | ICD-10-CM | POA: Insufficient documentation

## 2020-09-03 DIAGNOSIS — C9000 Multiple myeloma not having achieved remission: Secondary | ICD-10-CM

## 2020-09-03 DIAGNOSIS — C50922 Malignant neoplasm of unspecified site of left male breast: Secondary | ICD-10-CM | POA: Insufficient documentation

## 2020-09-03 DIAGNOSIS — Z79810 Long term (current) use of selective estrogen receptor modulators (SERMs): Secondary | ICD-10-CM | POA: Insufficient documentation

## 2020-09-03 DIAGNOSIS — N179 Acute kidney failure, unspecified: Secondary | ICD-10-CM

## 2020-09-03 DIAGNOSIS — Z9484 Stem cells transplant status: Secondary | ICD-10-CM | POA: Insufficient documentation

## 2020-09-03 DIAGNOSIS — Z5112 Encounter for antineoplastic immunotherapy: Secondary | ICD-10-CM | POA: Diagnosis not present

## 2020-09-03 DIAGNOSIS — Z9221 Personal history of antineoplastic chemotherapy: Secondary | ICD-10-CM | POA: Diagnosis not present

## 2020-09-03 DIAGNOSIS — I10 Essential (primary) hypertension: Secondary | ICD-10-CM | POA: Diagnosis not present

## 2020-09-03 DIAGNOSIS — I700 Atherosclerosis of aorta: Secondary | ICD-10-CM | POA: Insufficient documentation

## 2020-09-03 DIAGNOSIS — D539 Nutritional anemia, unspecified: Secondary | ICD-10-CM | POA: Diagnosis not present

## 2020-09-03 DIAGNOSIS — K219 Gastro-esophageal reflux disease without esophagitis: Secondary | ICD-10-CM | POA: Diagnosis not present

## 2020-09-03 DIAGNOSIS — G629 Polyneuropathy, unspecified: Secondary | ICD-10-CM | POA: Diagnosis not present

## 2020-09-03 DIAGNOSIS — I2510 Atherosclerotic heart disease of native coronary artery without angina pectoris: Secondary | ICD-10-CM | POA: Insufficient documentation

## 2020-09-03 DIAGNOSIS — C7951 Secondary malignant neoplasm of bone: Secondary | ICD-10-CM

## 2020-09-03 LAB — CBC WITH DIFFERENTIAL/PLATELET
Abs Immature Granulocytes: 0.02 10*3/uL (ref 0.00–0.07)
Basophils Absolute: 0.1 10*3/uL (ref 0.0–0.1)
Basophils Relative: 2 %
Eosinophils Absolute: 0.9 10*3/uL — ABNORMAL HIGH (ref 0.0–0.5)
Eosinophils Relative: 17 %
HCT: 28.6 % — ABNORMAL LOW (ref 39.0–52.0)
Hemoglobin: 9.3 g/dL — ABNORMAL LOW (ref 13.0–17.0)
Immature Granulocytes: 0 %
Lymphocytes Relative: 14 %
Lymphs Abs: 0.7 10*3/uL (ref 0.7–4.0)
MCH: 34.4 pg — ABNORMAL HIGH (ref 26.0–34.0)
MCHC: 32.5 g/dL (ref 30.0–36.0)
MCV: 105.9 fL — ABNORMAL HIGH (ref 80.0–100.0)
Monocytes Absolute: 0.9 10*3/uL (ref 0.1–1.0)
Monocytes Relative: 18 %
Neutro Abs: 2.6 10*3/uL (ref 1.7–7.7)
Neutrophils Relative %: 49 %
Platelets: 190 10*3/uL (ref 150–400)
RBC: 2.7 MIL/uL — ABNORMAL LOW (ref 4.22–5.81)
RDW: 16.3 % — ABNORMAL HIGH (ref 11.5–15.5)
WBC: 5.2 10*3/uL (ref 4.0–10.5)
nRBC: 0 % (ref 0.0–0.2)

## 2020-09-03 LAB — COMPREHENSIVE METABOLIC PANEL
ALT: 36 U/L (ref 0–44)
AST: 20 U/L (ref 15–41)
Albumin: 3.4 g/dL — ABNORMAL LOW (ref 3.5–5.0)
Alkaline Phosphatase: 27 U/L — ABNORMAL LOW (ref 38–126)
Anion gap: 10 (ref 5–15)
BUN: 14 mg/dL (ref 8–23)
CO2: 24 mmol/L (ref 22–32)
Calcium: 8.7 mg/dL — ABNORMAL LOW (ref 8.9–10.3)
Chloride: 106 mmol/L (ref 98–111)
Creatinine, Ser: 1.63 mg/dL — ABNORMAL HIGH (ref 0.61–1.24)
GFR calc Af Amer: 48 mL/min — ABNORMAL LOW (ref >60)
GFR calc non Af Amer: 41 mL/min — ABNORMAL LOW (ref >60)
Glucose, Bld: 113 mg/dL — ABNORMAL HIGH (ref 70–99)
Potassium: 3.7 mmol/L (ref 3.5–5.1)
Sodium: 140 mmol/L (ref 135–145)
Total Bilirubin: 0.5 mg/dL (ref 0.3–1.2)
Total Protein: 5.5 g/dL — ABNORMAL LOW (ref 6.5–8.1)

## 2020-09-03 LAB — LACTATE DEHYDROGENASE: LDH: 145 U/L (ref 98–192)

## 2020-09-03 LAB — MAGNESIUM: Magnesium: 1.9 mg/dL (ref 1.7–2.4)

## 2020-09-03 MED ORDER — EPOETIN ALFA-EPBX 10000 UNIT/ML IJ SOLN
20000.0000 [IU] | Freq: Once | INTRAMUSCULAR | Status: AC
  Administered 2020-09-03: 20000 [IU] via SUBCUTANEOUS
  Filled 2020-09-03: qty 2

## 2020-09-03 MED ORDER — DARATUMUMAB-HYALURONIDASE-FIHJ 1800-30000 MG-UT/15ML ~~LOC~~ SOLN
1800.0000 mg | Freq: Once | SUBCUTANEOUS | Status: AC
  Administered 2020-09-03: 1800 mg via SUBCUTANEOUS
  Filled 2020-09-03: qty 15

## 2020-09-03 MED ORDER — ACETAMINOPHEN 325 MG PO TABS: 650.0000 mg | ORAL_TABLET | Freq: Once | ORAL | Status: DC

## 2020-09-03 MED ORDER — DIPHENHYDRAMINE HCL 25 MG PO CAPS: 50.0000 mg | ORAL_CAPSULE | Freq: Once | ORAL | Status: DC

## 2020-09-03 MED ORDER — DEXAMETHASONE 4 MG PO TABS: 20.0000 mg | ORAL_TABLET | Freq: Once | ORAL | Status: DC

## 2020-09-03 NOTE — Progress Notes (Signed)
Patient was assessed by Dr. Katragadda and labs have been reviewed.  Patient is okay to proceed with treatment today. Primary RN and pharmacy aware.   

## 2020-09-03 NOTE — Patient Instructions (Signed)
[facility] Discharge Instructions for Patients Receiving Chemotherapy   Beginning January 23rd 2017 lab work for the Cancer Center will be done in the  Main lab at [place] on 1st floor. If you have a lab appointment with the Cancer Center please come in thru the  Main Entrance and check in at the main information desk   Today you received the following chemotherapy agents Daratumumab  To help prevent nausea and vomiting after your treatment, we encourage you to take your nausea medication   If you develop nausea and vomiting, or diarrhea that is not controlled by your medication, call the clinic.  The clinic phone number is [phone]. Office hours are Monday-Friday 8:30am-5:00pm.  BELOW ARE SYMPTOMS THAT SHOULD BE REPORTED IMMEDIATELY:  *FEVER GREATER THAN 101.0 F  *CHILLS WITH OR WITHOUT FEVER  NAUSEA AND VOMITING THAT IS NOT CONTROLLED WITH YOUR NAUSEA MEDICATION  *UNUSUAL SHORTNESS OF BREATH  *UNUSUAL BRUISING OR BLEEDING  TENDERNESS IN MOUTH AND THROAT WITH OR WITHOUT PRESENCE OF ULCERS  *URINARY PROBLEMS  *BOWEL PROBLEMS  UNUSUAL RASH Items with * indicate a potential emergency and should be followed up as soon as possible. If you have an emergency after office hours please contact your primary care physician or go to the nearest emergency department.  Please call the clinic during office hours if you have any questions or concerns.   You may also contact the Patient Navigator at [phone] should you have any questions or need assistance in obtaining follow up care.      Resources For Cancer Patients and their Caregivers ? [employer]: Can assist with transportation, wigs, general needs, runs Look Good Feel Better.        1-888-227-6333 ? Cancer Care: Provides financial assistance, online support groups, medication/co-pay assistance.  1-800-813-HOPE (4673) ? [facility] Assists [place]  cancer patients and their families through emotional , educational and financial support.  336-427-4357 ? [place] DSS Where to apply for food stamps, Medicaid and utility assistance. 336-342-1394 ? [facility]: Transportation to medical appointments. 336-347-2287 ? Social Security Administration: May apply for disability if have a Stage IV cancer. 336-342-7796 1-800-772-1213 ? [place] [facility] Transit [facility]: Assists with nutrition, care and transit needs. 336-349-2343          

## 2020-09-03 NOTE — Progress Notes (Signed)
[name], [place] [address]   CLINIC:  Medical Oncology/Hematology  PCP:  [name], MD [address] [place] [address] [phone]   REASON FOR VISIT:  Follow-up for multiple myeloma  PRIOR THERAPY:  1. RVD x 6 cycles from 03/01/2017 to 06/29/2017. 1. Stem cell transplant on 09/30/2017. 2. Carfilzomib and cyclophosphamide x 2 cycles from 02/14/2018 to 03/29/2018. 3. Carfilzomib x 22 cycles from 04/20/2018 to 01/23/2020.  NGS Results: Not done  CURRENT THERAPY: Darzalex Faspro every 2 weeks and Pomalyst  BRIEF ONCOLOGIC HISTORY:  Oncology History  Breast cancer, male ([place])  12/31/2009 Initial Biopsy   Biopsy of L breast    12/31/2009 Pathology Results   Invasive ductal carcinoma, ER/PR+, HER 2 negative   12/31/2009 Imaging   Ultrasound showing a 2.43 x 1.85 x 3 cm hypoechoic spiculated mass in the 12 o clock L breast retroareolar region   01/01/2010 -  Anti-estrogen oral therapy   Tamoxifen 20 mg daily   01/06/2010 Imaging   Bone scan abnormal uptake in the diaphysis of the R humerus, abnormal in the R third, fifth and sixth ribs, lesion also noted in the sternum.   02/03/2010 Surgery   Rod placement and fixation of R humerus by Dr. [name]   02/05/2010 - 02/18/2010 Radiation Therapy   30Gy in 10 fractions of 3 Gy per fraction to R pathologic fracture   03/11/2010 -  Chemotherapy   Denosumab monthly, now every 3 months. Started at [facility]    06/09/2016 Imaging   Three hypermetabolic osseous lesions in the sternum, left ilium and right ilium, as discussed above, likely represent osseous metastases. At this time, these are not recognizable on the CT images. 2. No extra skeletal metastatic disease identified in the neck, chest, abdomen or pelvis.   10/13/2016 Progression   PET shows various new and enlarging osseous metastatic lesions with no definite extra osseous metastatic disease currently identified.    12/31/2016 Progression    1. Multifocal hypermetabolic osseous metastases throughout the axial and proximal appendicular skeleton, which are increased in size, number and metabolism since 10/13/2016 PET-CT. 2. 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 3. No additional sites of hypermetabolic metastatic disease. 4. Chronic right mastoid sinusitis. 5. Aortic atherosclerosis.  One vessel coronary atherosclerosis.   Multiple myeloma ([name])  02/12/2017 Bone Marrow Biopsy   The marrow was variably cellular with large peritrabecular aggregates of kappa restricted plasma cells (66% by aspirate, 30% by Cd138). Cytogenetics +11.    03/01/2017 - 06/29/2017 Chemotherapy   RVD    05/26/2017 Bone Marrow Biopsy   Performed at [facility]:  Plasma cell myeloma in a 30% cellularmarrow with decreased trilineage hematopoiesis and 42% kappalight chain restricted plasma cells on the aspirate smears andlarge aggregates on the core biopsy.    07/12/2017 - 09/01/2017 Chemotherapy   3 cycles of carfizolmib/cyclophosphamide/dexamethasone     10/08/2017 Bone Marrow Transplant   Autotransplant at [facility]   02/28/2020 -  Chemotherapy   The patient had dexamethasone (DECADRON) tablet 20 mg, 20 mg, Oral,  Once, 2 of 2 cycles dexamethasone (DECADRON) tablet 20 mg, 20 mg, Oral, Once, 11 of 15 cycles Administration: 20 mg (03/25/2020), 20 mg (08/05/2020) daratumumab-hyaluronidase-fihj (DARZALEX FASPRO) 1800-30000 MG-UT/15ML chemo SQ injection 1,800 mg, 1,800 mg, Subcutaneous,  Once, 11 of 15 cycles Administration: 1,800 mg (02/28/2020), 1,800 mg (03/05/2020),  1,800 mg (03/12/2020), 1,800 mg (03/18/2020), 1,800 mg (03/25/2020), 1,800 mg (04/01/2020), 1,800 mg (04/29/2020), 1,800 mg (04/08/2020), 1,800 mg (04/15/2020), 1,800 mg (04/22/2020), 1,800 mg (05/28/2020), 1,800 mg (06/11/2020), 1,800 mg (06/24/2020), 1,800 mg  (07/08/2020), 1,800 mg (07/22/2020), 1,800 mg (08/05/2020), 1,800 mg (08/19/2020) bortezomib SQ (VELCADE) chemo injection 2.75 mg, 1.3 mg/m2 = 2.75 mg, Subcutaneous,  Once, 4 of 4 cycles Administration: 2.75 mg (02/28/2020), 2.75 mg (03/05/2020), 2.75 mg (03/08/2020), 2.75 mg (03/18/2020), 2.75 mg (03/21/2020), 2.75 mg (03/25/2020), 2.75 mg (03/28/2020), 2.75 mg (04/29/2020), 2.75 mg (05/02/2020), 2.75 mg (05/06/2020), 2.75 mg (05/09/2020), 2.75 mg (04/08/2020), 2.75 mg (04/11/2020), 2.75 mg (04/15/2020), 2.75 mg (04/18/2020)  for chemotherapy treatment.    Multiple myeloma not having achieved remission ([name])  06/29/2017 Initial Diagnosis   Multiple myeloma not having achieved remission ([name])   02/28/2020 -  Chemotherapy   The patient had dexamethasone (DECADRON) tablet 20 mg, 20 mg, Oral,  Once, 2 of 2 cycles dexamethasone (DECADRON) tablet 20 mg, 20 mg, Oral, Once, 11 of 15 cycles Administration: 20 mg (03/25/2020), 20 mg (08/05/2020) daratumumab-hyaluronidase-fihj (DARZALEX FASPRO) 1800-30000 MG-UT/15ML chemo SQ injection 1,800 mg, 1,800 mg, Subcutaneous,  Once, 11 of 15 cycles Administration: 1,800 mg (02/28/2020), 1,800 mg (03/05/2020), 1,800 mg (03/12/2020), 1,800 mg (03/18/2020), 1,800 mg (03/25/2020), 1,800 mg (04/01/2020), 1,800 mg (04/29/2020), 1,800 mg (04/08/2020), 1,800 mg (04/15/2020), 1,800 mg (04/22/2020), 1,800 mg (05/28/2020), 1,800 mg (06/11/2020), 1,800 mg (06/24/2020), 1,800 mg (07/08/2020), 1,800 mg (07/22/2020), 1,800 mg (08/05/2020), 1,800 mg (08/19/2020) bortezomib SQ (VELCADE) chemo injection 2.75 mg, 1.3 mg/m2 = 2.75 mg, Subcutaneous,  Once, 4 of 4 cycles Administration: 2.75 mg (02/28/2020), 2.75 mg (03/05/2020), 2.75 mg (03/08/2020), 2.75 mg (03/18/2020), 2.75 mg (03/21/2020), 2.75 mg (03/25/2020), 2.75 mg (03/28/2020), 2.75 mg (04/29/2020), 2.75 mg (05/02/2020), 2.75 mg (05/06/2020), 2.75 mg (05/09/2020), 2.75 mg (04/08/2020), 2.75 mg (04/11/2020), 2.75 mg (04/15/2020), 2.75 mg (04/18/2020)  for chemotherapy treatment.      CANCER  STAGING: Cancer Staging Multiple myeloma ([place]) Staging form: Plasma Cell Myeloma and Plasma Cell Disorders, AJCC 8th Edition - Clinical stage from 05/03/2017: Beta-2-microglobulin (mg/L): 3.5, Albumin (g/dL): 3.4, ISS: Stage II, High-risk cytogenetics: Absent, LDH: Not assessed - Signed by Baird Cancer, PA-C on 05/03/2017   INTERVAL HISTORY:  [name], a 74 y.o. male, returns for routine follow-up and consideration for next cycle of chemotherapy. Bran was last seen on 08/05/2020.  Due for cycle #12 of Darzalex Faspro today.   Overall, he tells me he has been feeling pretty well. He is tolerating the chemo and Pomalyst well and felt good last week; he reports having occasional nausea but denies needing any meds for nausea and denies V/D. He is trying to drink more fluids daily. He continues taking gabapentin TID. He stopped taking vitamin D and will finish taking Pomalyst on 9/12.  Overall, he feels ready for next cycle of chemo today.    REVIEW OF SYSTEMS:  Review of Systems  Constitutional: Positive for appetite change (mildly decreased) and fatigue (moderate).  Cardiovascular: Positive for leg swelling.  Gastrointestinal: Positive for nausea (occasional). Negative for diarrhea and vomiting.  Skin: Positive for itching (ankles).  Neurological: Positive for headaches and numbness (2/10 burning in legs).  All other systems reviewed and are negative.   PAST MEDICAL/SURGICAL HISTORY:  Past Medical History:  Diagnosis Date  . Anxiety   . Bone metastases ([place]) 09/10/2016  . Breast cancer ([place]) 2011   Stave IV breast cancer; radiation and tamoxifen  . Breast cancer, male ([place])  Stave IV breast cancer; radiation and tamoxifen  Overview:  Left breast ca with mets bone  Overview:  METS TO BONE  . GERD (gastroesophageal reflux disease)   . Hypertension   . Macular degeneration   . Multiple myeloma ([place]) 02/18/2017  . Peripheral neuropathy    Past Surgical History:   Procedure Laterality Date  . BACK SURGERY    . CATARACT EXTRACTION W/PHACO Left 02/03/2019   Procedure: CATARACT EXTRACTION PHACO AND INTRAOCULAR LENS PLACEMENT ([place]);  Surgeon: Baruch Goldmann, MD;  Location: [facility];  Service: Ophthalmology;  Laterality: Left;  CDE: 15.89  . HERNIA REPAIR    . PORTACATH PLACEMENT Left 06/10/2018   Procedure: INSERTION PORT-A-CATH;  Surgeon: Virl Cagey, MD;  Location: [facility];  Service: General;  Laterality: Left;  . right arm surgery      SOCIAL HISTORY:  Social History   Socioeconomic History  . Marital status: Married    Spouse name: Not on file  . Number of children: 3  . Years of education: Not on file  . Highest education level: Not on file  Occupational History  . Not on file  Tobacco Use  . Smoking status: Never Smoker  . Smokeless tobacco: Former Network engineer and Sexual Activity  . Alcohol use: Yes    Alcohol/week: 24.0 standard drinks    Types: 24 Cans of beer per week  . Drug use: No  . Sexual activity: Not on file  Other Topics Concern  . Not on file  Social History Narrative  . Not on file   Social Determinants of Health   Financial Resource Strain:   . Difficulty of Paying Living Expenses: Not on file  Food Insecurity:   . Worried About Charity fundraiser in the Last Year: Not on file  . Ran Out of Food in the Last Year: Not on file  Transportation Needs:   . Lack of Transportation (Medical): Not on file  . Lack of Transportation (Non-Medical): Not on file  Physical Activity:   . Days of Exercise per Week: Not on file  . Minutes of Exercise per Session: Not on file  Stress:   . Feeling of Stress : Not on file  Social Connections:   . Frequency of Communication with Friends and Family: Not on file  . Frequency of Social Gatherings with Friends and Family: Not on file  . Attends Religious Services: Not on file  . Active Member of Clubs or Organizations: Not on file  . Attends Archivist Meetings:  Not on file  . Marital Status: Not on file  Intimate Partner Violence:   . Fear of Current or Ex-Partner: Not on file  . Emotionally Abused: Not on file  . Physically Abused: Not on file  . Sexually Abused: Not on file    FAMILY HISTORY:  Family History  Problem Relation Age of Onset  . Stroke Mother   . Cancer Maternal Aunt        cancer NOS; died in her 18s  . Lung cancer Maternal Uncle        smoker    CURRENT MEDICATIONS:  Current Outpatient Medications  Medication Sig Dispense Refill  . acetaminophen (TYLENOL) 500 MG tablet Take 500 mg by mouth every 6 (six) hours as needed.     Marland Kitchen acyclovir (ZOVIRAX) 800 MG tablet TAKE 1 TABLET TWICE DAILY 180 tablet 3  . ALPRAZolam (XANAX) 0.5 MG tablet TAKE 1 TABLET BY MOUTH AT BEDTIME AS NEEDED FOR  ANXIETY OR SLEEP 30 tablet 3  . amLODipine (NORVASC) 5 MG tablet Take 1 tablet (5 mg total) by mouth daily. 30 tablet 0  . aspirin EC 81 MG tablet Take 81 mg by mouth daily.    Marland Kitchen augmented betamethasone dipropionate (DIPROLENE-AF) 0.05 % cream Apply topically as needed. 30 g 6  . calcium-vitamin D (OSCAL WITH D) 500-200 MG-UNIT TABS tablet TAKE TWO TABLETS BY MOUTH DAILY 60 tablet 6  . Cholecalciferol (VITAMIN D) 50 MCG (2000 UT) CAPS Take 4,000 Units by mouth 2 (two) times daily.    Marland Kitchen DARATUMUMAB-HYALURONIDASE-FIHJ [place] Inject 1,800 mg into the skin every 14 (fourteen) days.     Marland Kitchen denosumab (XGEVA) 120 MG/1.7ML SOLN injection Inject 120 mg into the skin every 30 (thirty) days.     Marland Kitchen dexamethasone (DECADRON) 4 MG tablet Take 5 pills before each treatment (Patient taking differently: Take 5 pills weekly.) 40 tablet 5  . diclofenac Sodium (VOLTAREN) 1 % GEL Use 1 to 2 times per day as directed by doctor    . folic acid (FOLVITE) 1 MG tablet TAKE 1 TABLET BY MOUTH DAILY 90 tablet 2  . gabapentin (NEURONTIN) 300 MG capsule Take 1 capsule (300 mg total) by mouth 3 (three) times daily. 180 capsule 2  . hydrALAZINE (APRESOLINE) 50 MG tablet Take 1 tablet  (50 mg total) by mouth every 8 (eight) hours. 90 tablet 0  . loratadine (CLARITIN) 10 MG tablet Take 1 tablet (10 mg total) by mouth daily. 90 tablet 1  . metoprolol succinate (TOPROL-XL) 100 MG 24 hr tablet Take 100 mg by mouth daily.     . Multiple Vitamin (MULTIVITAMIN WITH MINERALS) TABS tablet Take 1 tablet by mouth daily.    . ondansetron (ZOFRAN) 8 MG tablet TAKE 1 TABLET BY MOUTH EVERY 8 HOURS AS NEEDED FOR NAUSEA AND VOMITING 30 tablet 2  . pantoprazole (PROTONIX) 40 MG tablet Take 40 mg by mouth every other day.     . polyethylene glycol (MIRALAX / GLYCOLAX) packet Take 17 g by mouth daily as needed for mild constipation. 14 each 0  . POMALYST 2 MG capsule TAKE 1 CAPSULE BY MOUTH DAILY. 21 capsule 0  . prochlorperazine (COMPAZINE) 10 MG tablet Take 1 tablet (10 mg total) by mouth every 6 (six) hours as needed for nausea or vomiting. 30 tablet 2  . pseudoephedrine-guaifenesin (MUCINEX D) 60-600 MG 12 hr tablet Take 1 tablet by mouth 2 (two) times daily as needed for congestion.     . tamoxifen (NOLVADEX) 20 MG tablet TAKE 1 TABLET (20 MG TOTAL) BY MOUTH DAILY. 90 tablet 3  . vitamin B-12 (CYANOCOBALAMIN) 1000 MCG tablet Take 1,000 mcg by mouth daily.     No current facility-administered medications for this visit.   Facility-Administered Medications Ordered in Other Visits  Medication Dose Route Frequency Provider Last Rate Last Admin  . ondansetron (ZOFRAN) 8 mg in sodium chloride 0.9 % 50 mL IVPB  8 mg Intravenous Once Lockamy, Randi L, NP-C      . sodium chloride flush (NS) 0.9 % injection 10 mL  10 mL Intracatheter Once PRN Derek Jack, MD        ALLERGIES:  Allergies  Allergen Reactions  . Cefepime     Suspected severe thrombocytopenia is a result of cefepime induced antigen platelet destruction    PHYSICAL EXAM:  Performance status (ECOG): 1 - Symptomatic but completely ambulatory  There were no vitals filed for this visit. Wt Readings from Last 3 Encounters:  08/16/20 204 lb (92.5 kg)  08/05/20 206 lb 12.8 oz (93.8 kg)  07/22/20 (!) 206 lb 12.8 oz (93.8 kg)   Physical Exam Vitals reviewed.  Constitutional:      Appearance: Normal appearance. He is obese.  Cardiovascular:     Rate and Rhythm: Normal rate and regular rhythm.     Pulses: Normal pulses.     Heart sounds: Normal heart sounds.  Pulmonary:     Effort: Pulmonary effort is normal.     Breath sounds: Normal breath sounds.  Chest:     Comments: Port-a-Cath in L chest Musculoskeletal:     Right lower leg: No edema.     Left lower leg: No edema.  Neurological:     General: No focal deficit present.     Mental Status: He is alert and oriented to person, place, and time.  Psychiatric:        Mood and Affect: Mood normal.        Behavior: Behavior normal.     LABORATORY DATA:  I have reviewed the labs as listed.  CBC Latest Ref Rng & Units 09/03/2020 08/16/2020 08/05/2020  WBC 4.0 - 10.5 K/uL 5.2 7.0 5.4  Hemoglobin 13.0 - 17.0 g/dL 9.3(L) 10.3(L) 10.1(L)  Hematocrit 39 - 52 % 28.6(L) 31.4(L) 31.3(L)  Platelets 150 - 400 K/uL 190 232 186   CMP Latest Ref Rng & Units 09/03/2020 08/26/2020 08/20/2020  Glucose 70 - 99 mg/dL 113(H) 132(H) 129(H)  BUN 8 - 23 mg/dL 14 16 34(H)  Creatinine 0.61 - 1.24 mg/dL 1.63(H) 2.28(H) 3.35(H)  Sodium 135 - 145 mmol/L 140 137 134(L)  Potassium 3.5 - 5.1 mmol/L 3.7 3.7 3.6  Chloride 98 - 111 mmol/L 106 105 100  CO2 22 - 32 mmol/L _0 Calcium 8.9 - 10.3 mg/dL 8.7(L) 8.6(L) 11.0(H)  Total Protein 6.5 - 8.1 g/dL 5.5(L) 5.9(L) -  Total Bilirubin 0.3 - 1.2 mg/dL 0.5 0.5 -  Alkaline Phos 38 - 126 U/L 27(L) 29(L) -  AST 15 - 41 U/L 20 17 -  ALT 0 - 44 U/L 36 29 -   Lab Results  Component Value Date   LDH 145 09/03/2020   LDH 129 08/19/2020   LDH 107 08/05/2020   Lab Results  Component Value Date   TOTALPROTELP 5.3 (L) 08/19/2020   ALBUMINELP 2.9 08/19/2020   A1GS 0.4 08/19/2020   A2GS 0.9 08/19/2020   BETS 0.9 08/19/2020   GAMS 0.3  (L) 08/19/2020   MSPIKE 0.1 (H) 08/19/2020   SPEI Comment 08/19/2020   Lab Results  Component Value Date   KPAFRELGTCHN 28.8 (H) 08/19/2020   LAMBDASER 8.6 08/19/2020   KAPLAMBRATIO 3.35 (H) 08/19/2020    DIAGNOSTIC IMAGING:  I have independently reviewed the scans and discussed with the patient. No results found.   ASSESSMENT:  1. Relapsed multiple myeloma: -Autologous stem cell transplant on 10/08/2017 -Post transplant consolidation withCyCarD for 2 cycles with M spike undetectable. -Maintenance therapy with carfilzomib 70 mg per metered square days 1 and 15 every 28 days from 04/20/2018, dose reduced to 35 mg per metered square on 01/05/2019, titrated up to 56 mg per metered square on 01/09/2020. 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 -PET scan on 02/18/2020 showed new left supraclavicular lymph node at the thoracic inlet, SUV 14.2. Multifocal osseous lesions largely improved, though residual lesions noted including right acromion, left acromion, thyroid cartilage, right sternum. Additional lesions throughout the sternum, bilateral ribs, thoracolumbar  spine, bilateral pelvis have resolved. -BMBX on 02/14/2020 shows plasma cell myeloma, 60-70% of cells. FISH for myeloma was negative. Cytogenetics was normal. -4 cycles of daratumumab, bortezomib and dexamethasone from 02/28/2020 through 04/29/2020. -Daratumumab, pomalidomide and dexamethasone started on 05/28/2020. He is receiving daratumumab every 2 weeks, Pomalyst 3 mg days 1-21, dexamethasone 20 mg weekly. -Pomalidomide dose reduced to 2 mg days 1-21 around the first week of July 2021, due to severe weakness.   PLAN:  1. Relapsed IgG kappa multiple myeloma: -He is continuing pomalidomide and will finish on 09/08/2020.  He is tolerating it pretty well at lower dose of 2 mg. -Reviewed his labs which showed creatinine improved to 1.63.  Calcium is 8.7.  Myeloma labs on 08/19/2020 shows M  spike 0.1 g which is stable.  Free kappa light chains improved to 28.8 and light chain ratio improved to 3.35. -I have recommended continuing the same regimen.  He apparently has a follow-up at [facility] tomorrow. -I plan to see him back in 4 weeks for follow-up.  2. Myeloma bone disease: -He had recent hypercalcemia of 12.5 on 08/19/2020.  We have discontinued all his multivitamins, calcium and vitamin D.  Calcium today improved to 8.7.  3. Macrocytic anemia: -Hemoglobin today is 9.3.  We will give Procrit if hemoglobin less than 9.  4. ID prophylaxis: -Continue acyclovir twice daily.  5. Peripheral neuropathy: -Continue gabapentin 300 mg 3 times a day.  6. History of breast cancer: -Continue tamoxifen daily..   Orders placed this encounter:  No orders of the defined types were placed in this encounter.    Derek Jack, MD [facility] [phone]   I, Milinda Antis, am acting as a scribe for Dr. Sanda Linger.  I, Derek Jack MD, have reviewed the above documentation for accuracy and completeness, and I agree with the above.

## 2020-09-03 NOTE — Patient Instructions (Signed)
[place] at [facility] Discharge Instructions  You were seen today by Dr. Delton Coombes. He went over your recent results. You received your treatment today; continue receiving your treatments every 2 weeks. Continue taking acyclovir, folic acid, tamoxifen, Pomalyst and metoprolol and stop taking multivitamins and vitamin D. Dr. Delton Coombes will see you back in 4 weeks for labs and follow up.   Thank you for choosing [place] at [facility] to provide your oncology and hematology care.  To afford each patient quality time with our provider, please arrive at least 15 minutes before your scheduled appointment time.   If you have a lab appointment with the [facility] please come in thru the Main Entrance and check in at the main information desk  You need to re-schedule your appointment should you arrive 10 or more minutes late.  We strive to give you quality time with our providers, and arriving late affects you and other patients whose appointments are after yours.  Also, if you no show three or more times for appointments you may be dismissed from the clinic at the providers discretion.     Again, thank you for choosing [facility].  Our hope is that these requests will decrease the amount of time that you wait before being seen by our physicians.       _____________________________________________________________  Should you have questions after your visit to [facility], please contact our office at [phone] between the hours of 8:00 a.m. and 4:30 p.m.  Voicemails left after 4:00 p.m. will not be returned until the following business day.  For prescription refill requests, have your pharmacy contact our office and allow 72 hours.    Cancer Center Support Programs:   > Cancer Support Group  2nd Tuesday of the month 1pm-2pm, Journey Room

## 2020-09-03 NOTE — Progress Notes (Signed)
ELERY CADENHEAD presents today for [phone] daratumumab. Pt denies any new changes or symptoms since last treatment. Lab results and vitals have been reviewed and are stable and within parameters for treatment. Patient has been assessed by Dr. Delton Coombes who has approved proceeding with treatment today as planned. Pt states he took his Tylenol, Benadryl, and Decadron prior to arrival this morning.  Injections tolerated without incident or complaint, see MAR for details. Discharged in satisfactory condition with follow up instructions.

## 2020-09-04 ENCOUNTER — Encounter (HOSPITAL_COMMUNITY): Payer: Self-pay

## 2020-09-04 ENCOUNTER — Inpatient Hospital Stay (HOSPITAL_COMMUNITY): Payer: Medicare Other

## 2020-09-04 VITALS — BP 149/83 | HR 68 | Temp 96.8°F | Resp 18

## 2020-09-04 DIAGNOSIS — Z9484 Stem cells transplant status: Secondary | ICD-10-CM | POA: Diagnosis not present

## 2020-09-04 DIAGNOSIS — C7951 Secondary malignant neoplasm of bone: Secondary | ICD-10-CM

## 2020-09-04 DIAGNOSIS — Z5112 Encounter for antineoplastic immunotherapy: Secondary | ICD-10-CM | POA: Diagnosis not present

## 2020-09-04 DIAGNOSIS — C9000 Multiple myeloma not having achieved remission: Secondary | ICD-10-CM

## 2020-09-04 DIAGNOSIS — Z170 Estrogen receptor positive status [ER+]: Secondary | ICD-10-CM | POA: Diagnosis not present

## 2020-09-04 DIAGNOSIS — C50922 Malignant neoplasm of unspecified site of left male breast: Secondary | ICD-10-CM | POA: Diagnosis not present

## 2020-09-04 MED ORDER — DENOSUMAB 120 MG/1.7ML ~~LOC~~ SOLN
120.0000 mg | Freq: Once | SUBCUTANEOUS | Status: AC
  Administered 2020-09-04: 120 mg via SUBCUTANEOUS

## 2020-09-04 MED ORDER — DENOSUMAB 120 MG/1.7ML ~~LOC~~ SOLN
SUBCUTANEOUS | Status: AC
  Filled 2020-09-04: qty 1.7

## 2020-09-04 NOTE — Progress Notes (Signed)
Patient is not taking calcium per Dr. Tomie China orders.  Denied tooth, jaw, and leg pain.  No recent or upcoming dental visits.  Patient tolerated Xgeva injection with no complaints (Per pt, the extra Xgeva injection on Aug 23 was due to high calcium levels in order to decrease his levels).  Site clean and dry with no bruising or swelling noted at site.  Band aid applied.  VSS with discharge and left ambulatory with no s/s of distress noted.

## 2020-09-09 DIAGNOSIS — D801 Nonfamilial hypogammaglobulinemia: Secondary | ICD-10-CM | POA: Diagnosis not present

## 2020-09-09 DIAGNOSIS — Z79899 Other long term (current) drug therapy: Secondary | ICD-10-CM | POA: Diagnosis not present

## 2020-09-09 DIAGNOSIS — D649 Anemia, unspecified: Secondary | ICD-10-CM | POA: Diagnosis not present

## 2020-09-09 DIAGNOSIS — D72819 Decreased white blood cell count, unspecified: Secondary | ICD-10-CM | POA: Diagnosis not present

## 2020-09-09 DIAGNOSIS — Z853 Personal history of malignant neoplasm of breast: Secondary | ICD-10-CM | POA: Diagnosis not present

## 2020-09-09 DIAGNOSIS — C9000 Multiple myeloma not having achieved remission: Secondary | ICD-10-CM | POA: Diagnosis not present

## 2020-09-17 ENCOUNTER — Inpatient Hospital Stay (HOSPITAL_COMMUNITY): Payer: Medicare Other

## 2020-09-17 ENCOUNTER — Other Ambulatory Visit: Payer: Self-pay

## 2020-09-17 ENCOUNTER — Encounter (HOSPITAL_COMMUNITY): Payer: Self-pay

## 2020-09-17 VITALS — BP 148/80 | HR 61 | Temp 97.0°F | Resp 18 | Wt 201.8 lb

## 2020-09-17 DIAGNOSIS — C7951 Secondary malignant neoplasm of bone: Secondary | ICD-10-CM

## 2020-09-17 DIAGNOSIS — Z9484 Stem cells transplant status: Secondary | ICD-10-CM | POA: Diagnosis not present

## 2020-09-17 DIAGNOSIS — N179 Acute kidney failure, unspecified: Secondary | ICD-10-CM

## 2020-09-17 DIAGNOSIS — Z5112 Encounter for antineoplastic immunotherapy: Secondary | ICD-10-CM | POA: Diagnosis not present

## 2020-09-17 DIAGNOSIS — C9000 Multiple myeloma not having achieved remission: Secondary | ICD-10-CM

## 2020-09-17 DIAGNOSIS — C50922 Malignant neoplasm of unspecified site of left male breast: Secondary | ICD-10-CM | POA: Diagnosis not present

## 2020-09-17 DIAGNOSIS — Z170 Estrogen receptor positive status [ER+]: Secondary | ICD-10-CM | POA: Diagnosis not present

## 2020-09-17 LAB — COMPREHENSIVE METABOLIC PANEL
ALT: 15 U/L (ref 0–44)
AST: 11 U/L — ABNORMAL LOW (ref 15–41)
Albumin: 3.4 g/dL — ABNORMAL LOW (ref 3.5–5.0)
Alkaline Phosphatase: 26 U/L — ABNORMAL LOW (ref 38–126)
Anion gap: 5 (ref 5–15)
BUN: 11 mg/dL (ref 8–23)
CO2: 23 mmol/L (ref 22–32)
Calcium: 8.1 mg/dL — ABNORMAL LOW (ref 8.9–10.3)
Chloride: 110 mmol/L (ref 98–111)
Creatinine, Ser: 1.36 mg/dL — ABNORMAL HIGH (ref 0.61–1.24)
GFR calc Af Amer: 60 mL/min — ABNORMAL LOW (ref >60)
GFR calc non Af Amer: 52 mL/min — ABNORMAL LOW (ref >60)
Glucose, Bld: 102 mg/dL — ABNORMAL HIGH (ref 70–99)
Potassium: 4.1 mmol/L (ref 3.5–5.1)
Sodium: 138 mmol/L (ref 135–145)
Total Bilirubin: 0.7 mg/dL (ref 0.3–1.2)
Total Protein: 5.4 g/dL — ABNORMAL LOW (ref 6.5–8.1)

## 2020-09-17 LAB — CBC WITH DIFFERENTIAL/PLATELET
Abs Immature Granulocytes: 0.01 10*3/uL (ref 0.00–0.07)
Basophils Absolute: 0.2 10*3/uL — ABNORMAL HIGH (ref 0.0–0.1)
Basophils Relative: 4 %
Eosinophils Absolute: 0.4 10*3/uL (ref 0.0–0.5)
Eosinophils Relative: 8 %
HCT: 30.3 % — ABNORMAL LOW (ref 39.0–52.0)
Hemoglobin: 9.5 g/dL — ABNORMAL LOW (ref 13.0–17.0)
Immature Granulocytes: 0 %
Lymphocytes Relative: 26 %
Lymphs Abs: 1.1 10*3/uL (ref 0.7–4.0)
MCH: 34.2 pg — ABNORMAL HIGH (ref 26.0–34.0)
MCHC: 31.4 g/dL (ref 30.0–36.0)
MCV: 109 fL — ABNORMAL HIGH (ref 80.0–100.0)
Monocytes Absolute: 0.8 10*3/uL (ref 0.1–1.0)
Monocytes Relative: 19 %
Neutro Abs: 1.8 10*3/uL (ref 1.7–7.7)
Neutrophils Relative %: 43 %
Platelets: 225 10*3/uL (ref 150–400)
RBC: 2.78 MIL/uL — ABNORMAL LOW (ref 4.22–5.81)
RDW: 17.2 % — ABNORMAL HIGH (ref 11.5–15.5)
WBC: 4.3 10*3/uL (ref 4.0–10.5)
nRBC: 0 % (ref 0.0–0.2)

## 2020-09-17 LAB — LACTATE DEHYDROGENASE: LDH: 133 U/L (ref 98–192)

## 2020-09-17 LAB — MAGNESIUM: Magnesium: 1.8 mg/dL (ref 1.7–2.4)

## 2020-09-17 MED ORDER — EPOETIN ALFA-EPBX 10000 UNIT/ML IJ SOLN
INTRAMUSCULAR | Status: AC
  Filled 2020-09-17: qty 2

## 2020-09-17 MED ORDER — DIPHENHYDRAMINE HCL 25 MG PO CAPS: 50.0000 mg | ORAL_CAPSULE | Freq: Once | ORAL | Status: DC

## 2020-09-17 MED ORDER — DARATUMUMAB-HYALURONIDASE-FIHJ 1800-30000 MG-UT/15ML ~~LOC~~ SOLN
1800.0000 mg | Freq: Once | SUBCUTANEOUS | Status: AC
  Administered 2020-09-17: 1800 mg via SUBCUTANEOUS
  Filled 2020-09-17: qty 15

## 2020-09-17 MED ORDER — DEXAMETHASONE 4 MG PO TABS: 20.0000 mg | ORAL_TABLET | Freq: Once | ORAL | Status: DC

## 2020-09-17 MED ORDER — ACETAMINOPHEN 325 MG PO TABS: 650.0000 mg | ORAL_TABLET | Freq: Once | ORAL | Status: DC

## 2020-09-17 MED ORDER — EPOETIN ALFA-EPBX 10000 UNIT/ML IJ SOLN
20000.0000 [IU] | Freq: Once | INTRAMUSCULAR | Status: AC
  Administered 2020-09-17: 20000 [IU] via SUBCUTANEOUS

## 2020-09-17 NOTE — Progress Notes (Signed)
Patient presents today for treatment. Labs within parameters for treatment. Vital signs within parameters for treatment. Patient denies any pain today. Patient denies any significant changes since his last visit. Last Delton See 09/04/20. MAR reviewed and updated. Patient takes pre-meds at home prior to arriving. Patient states Tylenol 650 mg, Benadryl 50 mg and Dexamethasone 20 mg taken PO.   Treatment given today per MD orders. Tolerated  without adverse affects. Vital signs stable. No complaints at this time. Discharged from clinic ambulatory in stable condition. Alert and oriented x 3. F/U with [facility] as scheduled.

## 2020-09-17 NOTE — Patient Instructions (Signed)
[facility] Discharge Instructions for Patients Receiving Chemotherapy  Today you received the following chemotherapy agents   To help prevent nausea and vomiting after your treatment, we encourage you to take your nausea medication   If you develop nausea and vomiting that is not controlled by your nausea medication, call the clinic.   BELOW ARE SYMPTOMS THAT SHOULD BE REPORTED IMMEDIATELY:  *FEVER GREATER THAN 100.5 F  *CHILLS WITH OR WITHOUT FEVER  NAUSEA AND VOMITING THAT IS NOT CONTROLLED WITH YOUR NAUSEA MEDICATION  *UNUSUAL SHORTNESS OF BREATH  *UNUSUAL BRUISING OR BLEEDING  TENDERNESS IN MOUTH AND THROAT WITH OR WITHOUT PRESENCE OF ULCERS  *URINARY PROBLEMS  *BOWEL PROBLEMS  UNUSUAL RASH Items with * indicate a potential emergency and should be followed up as soon as possible.  Feel free to call the clinic should you have any questions or concerns. The clinic phone number is [phone].  Please show the CHEMO ALERT CARD at check-in to the Emergency Department and triage nurse.   

## 2020-09-18 LAB — PROTEIN ELECTROPHORESIS, SERUM
A/G Ratio: 1.6 (ref 0.7–1.7)
Albumin ELP: 3.1 g/dL (ref 2.9–4.4)
Alpha-1-Globulin: 0.3 g/dL (ref 0.0–0.4)
Alpha-2-Globulin: 0.6 g/dL (ref 0.4–1.0)
Beta Globulin: 0.7 g/dL (ref 0.7–1.3)
Gamma Globulin: 0.2 g/dL — ABNORMAL LOW (ref 0.4–1.8)
Globulin, Total: 1.9 g/dL — ABNORMAL LOW (ref 2.2–3.9)
M-Spike, %: 0.1 g/dL — ABNORMAL HIGH
Total Protein ELP: 5 g/dL — ABNORMAL LOW (ref 6.0–8.5)

## 2020-09-18 LAB — KAPPA/LAMBDA LIGHT CHAINS
Kappa free light chain: 10 mg/L (ref 3.3–19.4)
Kappa, lambda light chain ratio: >6.67 — ABNORMAL HIGH (ref 0.26–1.65)
Lambda free light chains: <1.5 mg/L — ABNORMAL LOW (ref 5.7–26.3)

## 2020-09-22 ENCOUNTER — Other Ambulatory Visit (HOSPITAL_COMMUNITY): Payer: Self-pay | Admitting: Hematology

## 2020-09-22 DIAGNOSIS — I10 Essential (primary) hypertension: Secondary | ICD-10-CM

## 2020-09-22 DIAGNOSIS — G4700 Insomnia, unspecified: Secondary | ICD-10-CM

## 2020-09-26 ENCOUNTER — Other Ambulatory Visit (HOSPITAL_COMMUNITY): Payer: Self-pay | Admitting: *Deleted

## 2020-09-26 ENCOUNTER — Other Ambulatory Visit (HOSPITAL_COMMUNITY): Payer: Self-pay | Admitting: Nurse Practitioner

## 2020-09-26 ENCOUNTER — Other Ambulatory Visit (HOSPITAL_COMMUNITY): Payer: Self-pay

## 2020-09-26 DIAGNOSIS — I10 Essential (primary) hypertension: Secondary | ICD-10-CM

## 2020-09-26 DIAGNOSIS — G4700 Insomnia, unspecified: Secondary | ICD-10-CM

## 2020-09-26 DIAGNOSIS — C9000 Multiple myeloma not having achieved remission: Secondary | ICD-10-CM

## 2020-09-26 MED ORDER — POMALIDOMIDE 2 MG PO CAPS: 2.0000 mg | ORAL_CAPSULE | Freq: Every day | ORAL | 0 refills | Status: DC

## 2020-10-01 ENCOUNTER — Encounter (HOSPITAL_COMMUNITY): Payer: Self-pay | Admitting: Hematology

## 2020-10-01 ENCOUNTER — Inpatient Hospital Stay (HOSPITAL_COMMUNITY): Payer: Medicare Other

## 2020-10-01 ENCOUNTER — Inpatient Hospital Stay (HOSPITAL_COMMUNITY): Payer: Medicare Other | Attending: Hematology

## 2020-10-01 ENCOUNTER — Inpatient Hospital Stay (HOSPITAL_BASED_OUTPATIENT_CLINIC_OR_DEPARTMENT_OTHER): Payer: Medicare Other | Admitting: Hematology

## 2020-10-01 ENCOUNTER — Other Ambulatory Visit: Payer: Self-pay

## 2020-10-01 VITALS — BP 143/82 | HR 66 | Temp 96.9°F | Resp 18 | Wt 206.6 lb

## 2020-10-01 DIAGNOSIS — C9000 Multiple myeloma not having achieved remission: Secondary | ICD-10-CM | POA: Diagnosis not present

## 2020-10-01 DIAGNOSIS — Z79811 Long term (current) use of aromatase inhibitors: Secondary | ICD-10-CM | POA: Diagnosis not present

## 2020-10-01 DIAGNOSIS — Z170 Estrogen receptor positive status [ER+]: Secondary | ICD-10-CM | POA: Insufficient documentation

## 2020-10-01 DIAGNOSIS — Z9484 Stem cells transplant status: Secondary | ICD-10-CM | POA: Insufficient documentation

## 2020-10-01 DIAGNOSIS — Z923 Personal history of irradiation: Secondary | ICD-10-CM | POA: Insufficient documentation

## 2020-10-01 DIAGNOSIS — Z23 Encounter for immunization: Secondary | ICD-10-CM | POA: Diagnosis not present

## 2020-10-01 DIAGNOSIS — Z5112 Encounter for antineoplastic immunotherapy: Secondary | ICD-10-CM | POA: Diagnosis not present

## 2020-10-01 DIAGNOSIS — N179 Acute kidney failure, unspecified: Secondary | ICD-10-CM

## 2020-10-01 DIAGNOSIS — D539 Nutritional anemia, unspecified: Secondary | ICD-10-CM | POA: Diagnosis not present

## 2020-10-01 DIAGNOSIS — C7951 Secondary malignant neoplasm of bone: Secondary | ICD-10-CM

## 2020-10-01 DIAGNOSIS — Z9221 Personal history of antineoplastic chemotherapy: Secondary | ICD-10-CM | POA: Diagnosis not present

## 2020-10-01 DIAGNOSIS — Z7982 Long term (current) use of aspirin: Secondary | ICD-10-CM | POA: Diagnosis not present

## 2020-10-01 DIAGNOSIS — C50922 Malignant neoplasm of unspecified site of left male breast: Secondary | ICD-10-CM | POA: Diagnosis not present

## 2020-10-01 DIAGNOSIS — G629 Polyneuropathy, unspecified: Secondary | ICD-10-CM | POA: Diagnosis not present

## 2020-10-01 LAB — CBC WITH DIFFERENTIAL/PLATELET
Abs Immature Granulocytes: 0.01 10*3/uL (ref 0.00–0.07)
Basophils Absolute: 0.1 10*3/uL (ref 0.0–0.1)
Basophils Relative: 3 %
Eosinophils Absolute: 0.7 10*3/uL — ABNORMAL HIGH (ref 0.0–0.5)
Eosinophils Relative: 17 %
HCT: 30.2 % — ABNORMAL LOW (ref 39.0–52.0)
Hemoglobin: 9.9 g/dL — ABNORMAL LOW (ref 13.0–17.0)
Immature Granulocytes: 0 %
Lymphocytes Relative: 19 %
Lymphs Abs: 0.7 10*3/uL (ref 0.7–4.0)
MCH: 35.2 pg — ABNORMAL HIGH (ref 26.0–34.0)
MCHC: 32.8 g/dL (ref 30.0–36.0)
MCV: 107.5 fL — ABNORMAL HIGH (ref 80.0–100.0)
Monocytes Absolute: 0.7 10*3/uL (ref 0.1–1.0)
Monocytes Relative: 18 %
Neutro Abs: 1.6 10*3/uL — ABNORMAL LOW (ref 1.7–7.7)
Neutrophils Relative %: 43 %
Platelets: 150 10*3/uL (ref 150–400)
RBC: 2.81 MIL/uL — ABNORMAL LOW (ref 4.22–5.81)
RDW: 16.7 % — ABNORMAL HIGH (ref 11.5–15.5)
WBC: 3.8 10*3/uL — ABNORMAL LOW (ref 4.0–10.5)
nRBC: 0 % (ref 0.0–0.2)

## 2020-10-01 LAB — COMPREHENSIVE METABOLIC PANEL
ALT: 13 U/L (ref 0–44)
AST: 12 U/L — ABNORMAL LOW (ref 15–41)
Albumin: 3.3 g/dL — ABNORMAL LOW (ref 3.5–5.0)
Alkaline Phosphatase: 25 U/L — ABNORMAL LOW (ref 38–126)
Anion gap: 8 (ref 5–15)
BUN: 14 mg/dL (ref 8–23)
CO2: 25 mmol/L (ref 22–32)
Calcium: 9.3 mg/dL (ref 8.9–10.3)
Chloride: 103 mmol/L (ref 98–111)
Creatinine, Ser: 1.52 mg/dL — ABNORMAL HIGH (ref 0.61–1.24)
GFR calc non Af Amer: 45 mL/min — ABNORMAL LOW (ref >60)
Glucose, Bld: 124 mg/dL — ABNORMAL HIGH (ref 70–99)
Potassium: 4.3 mmol/L (ref 3.5–5.1)
Sodium: 136 mmol/L (ref 135–145)
Total Bilirubin: 0.6 mg/dL (ref 0.3–1.2)
Total Protein: 5 g/dL — ABNORMAL LOW (ref 6.5–8.1)

## 2020-10-01 LAB — MAGNESIUM: Magnesium: 1.5 mg/dL — ABNORMAL LOW (ref 1.7–2.4)

## 2020-10-01 LAB — LACTATE DEHYDROGENASE: LDH: 112 U/L (ref 98–192)

## 2020-10-01 MED ORDER — DARATUMUMAB-HYALURONIDASE-FIHJ 1800-30000 MG-UT/15ML ~~LOC~~ SOLN
1800.0000 mg | Freq: Once | SUBCUTANEOUS | Status: AC
  Administered 2020-10-01: 1800 mg via SUBCUTANEOUS
  Filled 2020-10-01: qty 15

## 2020-10-01 MED ORDER — EPOETIN ALFA-EPBX 20000 UNIT/ML IJ SOLN
20000.0000 [IU] | Freq: Once | INTRAMUSCULAR | Status: AC
  Administered 2020-10-01: 20000 [IU] via SUBCUTANEOUS
  Filled 2020-10-01: qty 1

## 2020-10-01 MED ORDER — ACETAMINOPHEN 325 MG PO TABS: 650.0000 mg | ORAL_TABLET | Freq: Once | ORAL | Status: DC

## 2020-10-01 MED ORDER — DEXAMETHASONE 4 MG PO TABS: 20.0000 mg | ORAL_TABLET | Freq: Once | ORAL | Status: DC

## 2020-10-01 MED ORDER — EPOETIN ALFA-EPBX 10000 UNIT/ML IJ SOLN: 20000.0000 [IU] | Freq: Once | INTRAMUSCULAR | Status: DC

## 2020-10-01 MED ORDER — INFLUENZA VAC A&B SA ADJ QUAD 0.5 ML IM PRSY
0.5000 mL | PREFILLED_SYRINGE | Freq: Once | INTRAMUSCULAR | Status: AC
  Administered 2020-10-01: 0.5 mL via INTRAMUSCULAR
  Filled 2020-10-01: qty 0.5

## 2020-10-01 MED ORDER — DIPHENHYDRAMINE HCL 25 MG PO CAPS: 50.0000 mg | ORAL_CAPSULE | Freq: Once | ORAL | Status: DC

## 2020-10-01 NOTE — Progress Notes (Signed)
Johnny Lucas, [name] [id number]   CLINIC:  Medical Oncology/Hematology  PCP:  [name], MD [address] [place] [address] [phone]   REASON FOR VISIT:  Follow-up for multiple myeloma  PRIOR THERAPY:  1. RVD x 6 cycles from 03/01/2017 to 06/29/2017. 1. Stem cell transplant on 09/30/2017. 2. Carfilzomib and cyclophosphamide x 2 cycles from 02/14/2018 to 03/29/2018. 3. Carfilzomib x 22 cycles from 04/20/2018 to 01/23/2020.  NGS Results: Not done  CURRENT THERAPY: Darzalex Faspro every 2 weeks; Pomalyst 3 weeks on, 1 week off  BRIEF ONCOLOGIC HISTORY:  Oncology History  Breast cancer, male ([place])  12/31/2009 Initial Biopsy   Biopsy of L breast    12/31/2009 Pathology Results   Invasive ductal carcinoma, ER/PR+, HER 2 negative   12/31/2009 Imaging   Ultrasound showing a 2.43 x 1.85 x 3 cm hypoechoic spiculated mass in the 12 o clock L breast retroareolar region   01/01/2010 -  Anti-estrogen oral therapy   Tamoxifen 20 mg daily   01/06/2010 Imaging   Bone scan abnormal uptake in the diaphysis of the R humerus, abnormal in the R third, fifth and sixth ribs, lesion also noted in the sternum.   02/03/2010 Surgery   Rod placement and fixation of R humerus by Dr. [name]   02/05/2010 - 02/18/2010 Radiation Therapy   30Gy in 10 fractions of 3 Gy per fraction to R pathologic fracture   03/11/2010 -  Chemotherapy   Denosumab monthly, now every 3 months. Started at [facility]    06/09/2016 Imaging   Three hypermetabolic osseous lesions in the sternum, left ilium and right ilium, as discussed above, likely represent osseous metastases. At this time, these are not recognizable on the CT images. 2. No extra skeletal metastatic disease identified in the neck, chest, abdomen or pelvis.   10/13/2016 Progression   PET shows various new and enlarging osseous metastatic lesions with no definite extra osseous metastatic disease currently identified.      12/31/2016 Progression   1. Multifocal hypermetabolic osseous metastases throughout the axial and proximal appendicular skeleton, which are increased in size, number and metabolism since 10/13/2016 PET-CT. 2. 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 3. No additional sites of hypermetabolic metastatic disease. 4. Chronic right mastoid sinusitis. 5. Aortic atherosclerosis.  One vessel coronary atherosclerosis.   Multiple myeloma ([place])  02/12/2017 Bone Marrow Biopsy   The marrow was variably cellular with large peritrabecular aggregates of kappa restricted plasma cells (66% by aspirate, 30% by Cd138). Cytogenetics +11.    03/01/2017 - 06/29/2017 Chemotherapy   RVD    05/26/2017 Bone Marrow Biopsy   Performed at [facility]:  Plasma cell myeloma in a 30% cellularmarrow with decreased trilineage hematopoiesis and 42% kappalight chain restricted plasma cells on the aspirate smears andlarge aggregates on the core biopsy.    07/12/2017 - 09/01/2017 Chemotherapy   3 cycles of carfizolmib/cyclophosphamide/dexamethasone     10/08/2017 Bone Marrow Transplant   Autotransplant at [facility]   02/28/2020 -  Chemotherapy   The patient had dexamethasone (DECADRON) tablet 20 mg, 20 mg, Oral,  Once, 2 of 2 cycles dexamethasone (DECADRON) tablet 20 mg, 20 mg, Oral, Once, 13 of 15 cycles Administration: 20 mg (03/25/2020), 20 mg (08/05/2020) daratumumab-hyaluronidase-fihj (DARZALEX FASPRO) 1800-30000 MG-UT/15ML chemo SQ injection 1,800 mg, 1,800 mg, Subcutaneous,  Once, 13 of 15 cycles Administration:  1,800 mg (02/28/2020), 1,800 mg (03/05/2020), 1,800 mg (03/12/2020), 1,800 mg (03/18/2020), 1,800 mg (03/25/2020), 1,800 mg (04/01/2020), 1,800 mg (04/29/2020), 1,800 mg (04/08/2020), 1,800 mg (04/15/2020), 1,800 mg (04/22/2020), 1,800 mg (05/28/2020), 1,800 mg (06/11/2020), 1,800 mg  (06/24/2020), 1,800 mg (07/08/2020), 1,800 mg (07/22/2020), 1,800 mg (08/05/2020), 1,800 mg (08/19/2020), 1,800 mg (09/03/2020), 1,800 mg (09/17/2020) bortezomib SQ (VELCADE) chemo injection 2.75 mg, 1.3 mg/m2 = 2.75 mg, Subcutaneous,  Once, 4 of 4 cycles Administration: 2.75 mg (02/28/2020), 2.75 mg (03/05/2020), 2.75 mg (03/08/2020), 2.75 mg (03/18/2020), 2.75 mg (03/21/2020), 2.75 mg (03/25/2020), 2.75 mg (03/28/2020), 2.75 mg (04/29/2020), 2.75 mg (05/02/2020), 2.75 mg (05/06/2020), 2.75 mg (05/09/2020), 2.75 mg (04/08/2020), 2.75 mg (04/11/2020), 2.75 mg (04/15/2020), 2.75 mg (04/18/2020)  for chemotherapy treatment.    Multiple myeloma not having achieved remission ([place])  06/29/2017 Initial Diagnosis   Multiple myeloma not having achieved remission ([place])   02/28/2020 -  Chemotherapy   The patient had dexamethasone (DECADRON) tablet 20 mg, 20 mg, Oral,  Once, 2 of 2 cycles dexamethasone (DECADRON) tablet 20 mg, 20 mg, Oral, Once, 13 of 15 cycles Administration: 20 mg (03/25/2020), 20 mg (08/05/2020) daratumumab-hyaluronidase-fihj (DARZALEX FASPRO) 1800-30000 MG-UT/15ML chemo SQ injection 1,800 mg, 1,800 mg, Subcutaneous,  Once, 13 of 15 cycles Administration: 1,800 mg (02/28/2020), 1,800 mg (03/05/2020), 1,800 mg (03/12/2020), 1,800 mg (03/18/2020), 1,800 mg (03/25/2020), 1,800 mg (04/01/2020), 1,800 mg (04/29/2020), 1,800 mg (04/08/2020), 1,800 mg (04/15/2020), 1,800 mg (04/22/2020), 1,800 mg (05/28/2020), 1,800 mg (06/11/2020), 1,800 mg (06/24/2020), 1,800 mg (07/08/2020), 1,800 mg (07/22/2020), 1,800 mg (08/05/2020), 1,800 mg (08/19/2020), 1,800 mg (09/03/2020), 1,800 mg (09/17/2020) bortezomib SQ (VELCADE) chemo injection 2.75 mg, 1.3 mg/m2 = 2.75 mg, Subcutaneous,  Once, 4 of 4 cycles Administration: 2.75 mg (02/28/2020), 2.75 mg (03/05/2020), 2.75 mg (03/08/2020), 2.75 mg (03/18/2020), 2.75 mg (03/21/2020), 2.75 mg (03/25/2020), 2.75 mg (03/28/2020), 2.75 mg (04/29/2020), 2.75 mg (05/02/2020), 2.75 mg (05/06/2020), 2.75 mg (05/09/2020), 2.75 mg (04/08/2020), 2.75 mg  (04/11/2020), 2.75 mg (04/15/2020), 2.75 mg (04/18/2020)  for chemotherapy treatment.      CANCER STAGING: Cancer Staging Multiple myeloma ([place]) Staging form: Plasma Cell Myeloma and Plasma Cell Disorders, AJCC 8th Edition - Clinical stage from 05/03/2017: Beta-2-microglobulin (mg/L): 3.5, Albumin (g/dL): 3.4, ISS: Stage II, High-risk cytogenetics: Absent, LDH: Not assessed - Signed by Baird Cancer, PA-C on 05/03/2017   INTERVAL HISTORY:  Mr. KEELEN QUEVEDO, a 75 y.o. male, returns for routine follow-up and consideration for next cycle of chemotherapy. [place] was last seen on 09/03/2020.  Due for cycle #14 of Darzalex Faspro today.   Overall, he tells me he has been feeling pretty well. He denies having severe weakness or falls. He is tolerating the treatment well. He is taking the Pomalyst 3 weeks on, 1 week off and denies having any new pains, skin rashes or diarrhea. He takes Decadron 5 tablets every week.  He is open to receiving his flu shot today.  Overall, he feels ready for next cycle of chemo today.    REVIEW OF SYSTEMS:  Review of Systems  Constitutional: Positive for fatigue (75%). Negative for appetite change.  Gastrointestinal: Positive for nausea (occasional). Negative for diarrhea.  Musculoskeletal: Negative for arthralgias and myalgias.  Skin: Negative for rash.  Neurological: Positive for numbness (feet & legs). Negative for extremity weakness.  All other systems reviewed and are negative.   PAST MEDICAL/SURGICAL HISTORY:  Past Medical History:  Diagnosis Date   Anxiety    Bone metastases ([place]) 09/10/2016   Breast cancer ([place]) 2011   Stage  IV breast cancer; radiation and tamoxifen   Breast cancer, male ([place])    Stage IV breast cancer; radiation and tamoxifen  Overview:  Left breast ca with mets bone  Overview:  METS TO BONE   GERD (gastroesophageal reflux disease)    Hypertension    Macular degeneration    Multiple myeloma ([place]) 02/18/2017    Peripheral neuropathy    Past Surgical History:  Procedure Laterality Date   BACK SURGERY     CATARACT EXTRACTION W/PHACO Left 02/03/2019   Procedure: CATARACT EXTRACTION PHACO AND INTRAOCULAR LENS PLACEMENT ([place]);  Surgeon: Baruch Goldmann, MD;  Location: [facility];  Service: Ophthalmology;  Laterality: Left;  CDE: 15.89   HERNIA REPAIR     PORTACATH PLACEMENT Left 06/10/2018   Procedure: INSERTION PORT-A-CATH;  Surgeon: Virl Cagey, MD;  Location: [facility];  Service: General;  Laterality: Left;   right arm surgery      SOCIAL HISTORY:  Social History   Socioeconomic History   Marital status: Married    Spouse name: Not on file   Number of children: 3   Years of education: Not on file   Highest education level: Not on file  Occupational History   Not on file  Tobacco Use   Smoking status: Never Smoker   Smokeless tobacco: Former Systems developer  Substance and Sexual Activity   Alcohol use: Yes    Alcohol/week: 24.0 standard drinks    Types: 24 Cans of beer per week   Drug use: No   Sexual activity: Not on file  Other Topics Concern   Not on file  Social History Narrative   Not on file   Social Determinants of Health   Financial Resource Strain:    Difficulty of Paying Living Expenses: Not on file  Food Insecurity:    Worried About [place] in the Last Year: Not on file   Ran Out of Food in the Last Year: Not on file  Transportation Needs:    Lack of Transportation (Medical): Not on file   Lack of Transportation (Non-Medical): Not on file  Physical Activity:    Days of Exercise per Week: Not on file   Minutes of Exercise per Session: Not on file  Stress:    Feeling of Stress : Not on file  Social Connections:    Frequency of Communication with Friends and Family: Not on file   Frequency of Social Gatherings with Friends and Family: Not on file   Attends Religious Services: Not on file   Active Member of Clubs or Organizations: Not on  file   Attends Archivist Meetings: Not on file   Marital Status: Not on file  Intimate Partner Violence:    Fear of Current or Ex-Partner: Not on file   Emotionally Abused: Not on file   Physically Abused: Not on file   Sexually Abused: Not on file    FAMILY HISTORY:  Family History  Problem Relation Age of Onset   Stroke Mother    Cancer Maternal Aunt        cancer NOS; died in her 36s   Lung cancer Maternal Uncle        smoker    CURRENT MEDICATIONS:  Current Outpatient Medications  Medication Sig Dispense Refill   acetaminophen (TYLENOL) 500 MG tablet Take 500 mg by mouth every 6 (six) hours as needed.      acyclovir (ZOVIRAX) 800 MG tablet TAKE 1 TABLET TWICE DAILY 180 tablet 3  ALPRAZolam (XANAX) 0.5 MG tablet TAKE 1 TABLET BY MOUTH AT BEDTIME AS NEEDED FOR ANXIETY OR SLEEP 30 tablet 3   amLODipine (NORVASC) 5 MG tablet Take 1 tablet (5 mg total) by mouth daily. 30 tablet 0   aspirin EC 81 MG tablet Take 81 mg by mouth daily.      augmented betamethasone dipropionate (DIPROLENE-AF) 0.05 % cream Apply topically as needed. 30 g 6   calcium-vitamin D (OSCAL WITH D) 500-200 MG-UNIT TABS tablet TAKE TWO TABLETS BY MOUTH DAILY 60 tablet 6   Cholecalciferol (VITAMIN D) 50 MCG (2000 UT) CAPS Take 4,000 Units by mouth 2 (two) times daily.      DARATUMUMAB-HYALURONIDASE-FIHJ [place] Inject 1,800 mg into the skin every 14 (fourteen) days.      denosumab (XGEVA) 120 MG/1.7ML SOLN injection Inject 120 mg into the skin every 30 (thirty) days.      dexamethasone (DECADRON) 4 MG tablet Take 5 pills before each treatment (Patient taking differently: Take 5 pills weekly.) 40 tablet 5   diclofenac Sodium (VOLTAREN) 1 % GEL Use 1 to 2 times per day as directed by doctor     folic acid (FOLVITE) 1 MG tablet TAKE 1 TABLET BY MOUTH DAILY 90 tablet 2   gabapentin (NEURONTIN) 300 MG capsule Take 1 capsule (300 mg total) by mouth 3 (three) times daily. 180 capsule 2    hydrALAZINE (APRESOLINE) 50 MG tablet Take 1 tablet (50 mg total) by mouth every 8 (eight) hours. 90 tablet 0   loratadine (CLARITIN) 10 MG tablet Take 1 tablet (10 mg total) by mouth daily. 90 tablet 1   metoprolol succinate (TOPROL-XL) 100 MG 24 hr tablet Take 100 mg by mouth daily.      Multiple Vitamin (MULTIVITAMIN WITH MINERALS) TABS tablet Take 1 tablet by mouth daily.      pantoprazole (PROTONIX) 40 MG tablet Take 40 mg by mouth every other day.      polyethylene glycol (MIRALAX / GLYCOLAX) packet Take 17 g by mouth daily as needed for mild constipation. 14 each 0   pomalidomide (POMALYST) 2 MG capsule Take 1 capsule (2 mg total) by mouth daily. [employer] authorization # [id number] 21 capsule 0   pseudoephedrine-guaifenesin (MUCINEX D) 60-600 MG 12 hr tablet Take 1 tablet by mouth 2 (two) times daily as needed for congestion.      tamoxifen (NOLVADEX) 20 MG tablet TAKE 1 TABLET (20 MG TOTAL) BY MOUTH DAILY. 90 tablet 3   vitamin B-12 (CYANOCOBALAMIN) 1000 MCG tablet Take 1,000 mcg by mouth daily.      ondansetron (ZOFRAN) 8 MG tablet TAKE 1 TABLET BY MOUTH EVERY 8 HOURS AS NEEDED FOR NAUSEA AND VOMITING (Patient not taking: Reported on 10/01/2020) 30 tablet 2   prochlorperazine (COMPAZINE) 10 MG tablet Take 1 tablet (10 mg total) by mouth every 6 (six) hours as needed for nausea or vomiting. (Patient not taking: Reported on 10/01/2020) 30 tablet 2   Current Facility-Administered Medications  Medication Dose Route Frequency Provider Last Rate Last Admin   influenza vaccine adjuvanted (FLUAD) injection 0.5 mL  0.5 mL Intramuscular Once Derek Jack, MD       Facility-Administered Medications Ordered in Other Visits  Medication Dose Route Frequency Provider Last Rate Last Admin   ondansetron (ZOFRAN) 8 mg in sodium chloride 0.9 % 50 mL IVPB  8 mg Intravenous Once Lockamy, Randi L, NP-C       sodium chloride flush (NS) 0.9 % injection 10 mL  10 mL Intracatheter Once  PRN  Derek Jack, MD        ALLERGIES:  Allergies  Allergen Reactions   Cefepime     Suspected severe thrombocytopenia is a result of cefepime induced antigen platelet destruction    PHYSICAL EXAM:  Performance status (ECOG): 1 - Symptomatic but completely ambulatory  Vitals:   10/01/20 0952  BP: (!) 143/82  Pulse: 66  Resp: 18  Temp: (!) 96.9 F (36.1 C)  SpO2: 95%   Wt Readings from Last 3 Encounters:  10/01/20 206 lb 9.6 oz (93.7 kg)  09/17/20 201 lb 12.8 oz (91.5 kg)  09/03/20 204 lb (92.5 kg)   Physical Exam Vitals reviewed.  Constitutional:      Appearance: Normal appearance. He is obese.  Cardiovascular:     Rate and Rhythm: Normal rate and regular rhythm.     Pulses: Normal pulses.     Heart sounds: Normal heart sounds.  Pulmonary:     Effort: Pulmonary effort is normal.     Breath sounds: Normal breath sounds.  Musculoskeletal:     Right lower leg: Edema (1+) present.     Left lower leg: Edema (1+) present.  Neurological:     General: No focal deficit present.     Mental Status: He is alert and oriented to person, place, and time.  Psychiatric:        Mood and Affect: Mood normal.        Behavior: Behavior normal.     LABORATORY DATA:  I have reviewed the labs as listed.  CBC Latest Ref Rng & Units 10/01/2020 09/17/2020 09/03/2020  WBC 4.0 - 10.5 K/uL 3.8(L) 4.3 5.2  Hemoglobin 13.0 - 17.0 g/dL 9.9(L) 9.5(L) 9.3(L)  Hematocrit 39 - 52 % 30.2(L) 30.3(L) 28.6(L)  Platelets 150 - 400 K/uL 150 225 190   CMP Latest Ref Rng & Units 09/17/2020 09/03/2020 08/26/2020  Glucose 70 - 99 mg/dL 102(H) 113(H) 132(H)  BUN 8 - 23 mg/dL 11 14 16   Creatinine 0.61 - 1.24 mg/dL 1.36(H) 1.63(H) 2.28(H)  Sodium 135 - 145 mmol/L 138 140 137  Potassium 3.5 - 5.1 mmol/L 4.1 3.7 3.7  Chloride 98 - 111 mmol/L 110 106 105  CO2 22 - 32 mmol/L 23 24 24   Calcium 8.9 - 10.3 mg/dL 8.1(L) 8.7(L) 8.6(L)  Total Protein 6.5 - 8.1 g/dL 5.4(L) 5.5(L) 5.9(L)  Total Bilirubin 0.3 -  1.2 mg/dL 0.7 0.5 0.5  Alkaline Phos 38 - 126 U/L 26(L) 27(L) 29(L)  AST 15 - 41 U/L 11(L) 20 17  ALT 0 - 44 U/L 15 36 29   Lab Results  Component Value Date   LDH 112 10/01/2020   LDH 133 09/17/2020   LDH 145 09/03/2020   Lab Results  Component Value Date   TOTALPROTELP 5.0 (L) 09/17/2020   ALBUMINELP 3.1 09/17/2020   A1GS 0.3 09/17/2020   A2GS 0.6 09/17/2020   BETS 0.7 09/17/2020   GAMS 0.2 (L) 09/17/2020   MSPIKE 0.1 (H) 09/17/2020   SPEI Comment 09/17/2020    Lab Results  Component Value Date   KPAFRELGTCHN 10.0 09/17/2020   LAMBDASER <1.5 (L) 09/17/2020   KAPLAMBRATIO >6.67 (H) 09/17/2020    DIAGNOSTIC IMAGING:  I have independently reviewed the scans and discussed with the patient. No results found.   ASSESSMENT:  1. Relapsed multiple myeloma: -Autologous stem cell transplant on 10/08/2017 -Post transplant consolidation withCyCarD for 2 cycles with M spike undetectable. -Maintenance therapy with carfilzomib 70 mg per metered square days 1 and 15  every 28 days from 04/20/2018, dose reduced to 35 mg per metered square on 01/05/2019, titrated up to 56 mg per metered square on 01/09/2020. 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 -PET scan on 02/18/2020 showed new left supraclavicular lymph node at the thoracic inlet, SUV 14.2. Multifocal osseous lesions largely improved, though residual lesions noted including right acromion, left acromion, thyroid cartilage, right sternum. Additional lesions throughout the sternum, bilateral ribs, thoracolumbar spine, bilateral pelvis have resolved. -BMBX on 02/14/2020 shows plasma cell myeloma, 60-70% of cells. FISH for myeloma was negative. Cytogenetics was normal. -4 cycles of daratumumab, bortezomib and dexamethasone from 02/28/2020 through 04/29/2020. -Daratumumab, pomalidomide and dexamethasone started on 05/28/2020. He is receiving daratumumab every 2 weeks, Pomalyst 3 mg days 1-21,  dexamethasone 20 mg weekly. -Pomalidomide dose reduced to 2 mg days 1-21 around the first week of July 2021, due to severe weakness.   PLAN:  1. Relapsed IgG kappa multiple myeloma: -He is tolerating pomalidomide 2 mg 3 weeks on 1 week off.  He is also taking dexamethasone 20 mg weekly. -He was also evaluated at [facility] from 09/17/2020 with M spike 0.1.  Light chain ratio is 6.67 and lambda light chains were very low below 1.5.  Kappa light chains improved to 10. -We will continue daratumumab every 2 weeks for now.  We will repeat myeloma labs prior to next visit in 4 weeks. -If the myeloma labs continue to show VG PR, will consider switching to monthly Darzalex.  2. Myeloma bone disease: -Continue denosumab monthly.  3. Macrocytic anemia: -Hemoglobin today is 9.9.  Continue Procrit as needed.  4. ID prophylaxis: -Continue acyclovir twice daily.  5. Peripheral neuropathy: -Continue gabapentin 300 mg 3 times a day.  6. History of breast cancer: -Continue tamoxifen daily.   Orders placed this encounter:  No orders of the defined types were placed in this encounter.    Derek Jack, MD [place] [phone]   I, Milinda Antis, am acting as a scribe for Dr. Sanda Linger.  I, Derek Jack MD, have reviewed the above documentation for accuracy and completeness, and I agree with the above.

## 2020-10-01 NOTE — Patient Instructions (Signed)
[facility] Discharge Instructions for Patients Receiving Chemotherapy   Beginning January 23rd 2017 lab work for the [facility] will be done in the  Main lab at [facility] on 1st floor. If you have a lab appointment with the [facility] please come in thru the  Main Entrance and check in at the main information desk   Today you received the following chemotherapy agents Darzalex injection as well as Retacrit injection and Influenza vaccine. Follow-up as scheduled  To help prevent nausea and vomiting after your treatment, we encourage you to take your nausea medication   If you develop nausea and vomiting, or diarrhea that is not controlled by your medication, call the clinic.  The clinic phone number is [phone]. Office hours are Monday-Friday 8:30am-5:00pm.  BELOW ARE SYMPTOMS THAT SHOULD BE REPORTED IMMEDIATELY:  *FEVER GREATER THAN 101.0 F  *CHILLS WITH OR WITHOUT FEVER  NAUSEA AND VOMITING THAT IS NOT CONTROLLED WITH YOUR NAUSEA MEDICATION  *UNUSUAL SHORTNESS OF BREATH  *UNUSUAL BRUISING OR BLEEDING  TENDERNESS IN MOUTH AND THROAT WITH OR WITHOUT PRESENCE OF ULCERS  *URINARY PROBLEMS  *BOWEL PROBLEMS  UNUSUAL RASH Items with * indicate a potential emergency and should be followed up as soon as possible. If you have an emergency after office hours please contact your primary care physician or go to the nearest emergency department.  Please call the clinic during office hours if you have any questions or concerns.   You may also contact the Patient Navigator at [phone] should you have any questions or need assistance in obtaining follow up care.      Resources For Cancer Patients and their Caregivers ? American Cancer Society: Can assist with transportation, wigs, general needs, runs Look Good Feel Better.        402-217-1541 ? Cancer Care: Provides financial assistance, online support groups, medication/co-pay assistance.   1-800-813-HOPE 231 831 6502) ? [place] Assists [place] cancer patients and their families through emotional , educational and financial support.  684-243-4381 ? [place] DSS Where to apply for food stamps, Medicaid and utility assistance. (917)540-7517 ? [facility]: Transportation to medical appointments. 239-274-1350 ? Social Security Administration: May apply for disability if have a Stage IV cancer. 3658852438 928-682-2372 ? [employer], Disability and Transit Services: Assists with nutrition, care and transit needs. [phone]

## 2020-10-01 NOTE — Progress Notes (Signed)
Patient was assessed by Dr. Delton Coombes and labs have been reviewed.  Patient is okay to proceed with treatment today. Primary RN and pharmacy aware.   Patient reports taking his pomalyst, no missed doses. No reportable side effects.

## 2020-10-01 NOTE — Patient Instructions (Addendum)
[place] at [facility] Discharge Instructions  You were seen today by Dr. Delton Coombes. He went over your recent results. You received your treatment today; continue receiving your treatment every 2 weeks. Continue taking the Pomalyst 3 weeks on, then stop for 1 week. Dr. Delton Coombes will see you back in 4 weeks for labs and follow up.   Thank you for choosing [place] at [facility] to provide your oncology and hematology care.  To afford each patient quality time with our provider, please arrive at least 15 minutes before your scheduled appointment time.   If you have a lab appointment with the [facility] please come in thru the Main Entrance and check in at the main information desk  You need to re-schedule your appointment should you arrive 10 or more minutes late.  We strive to give you quality time with our providers, and arriving late affects you and other patients whose appointments are after yours.  Also, if you no show three or more times for appointments you may be dismissed from the clinic at the providers discretion.     Again, thank you for choosing [facility].  Our hope is that these requests will decrease the amount of time that you wait before being seen by our physicians.       _____________________________________________________________  Should you have questions after your visit to [facility], please contact our office at [phone] between the hours of 8:00 a.m. and 4:30 p.m.  Voicemails left after 4:00 p.m. will not be returned until the following business day.  For prescription refill requests, have your pharmacy contact our office and allow 72 hours.    Cancer Center Support Programs:   > Cancer Support Group  2nd Tuesday of the month 1pm-2pm, Journey Room

## 2020-10-01 NOTE — Progress Notes (Signed)
1215 Labs reviewed with and pt seen by Dr. Delton Coombes and pt approved for Darzalex and Retacrit injections with Influenza vaccine ordered per MD today. Magnesium 1.5 today and Dr. Delton Coombes ordered Magnesium 2 grams IV but pt would prefer to wait and take that infusion tomorrow when he comes back for his Xgeva injection.                         [name] tolerated Darzalex and Retacrit injections and Influenza vaccine well without complaints or incident. Hgb 9.9 today VSS Pt discharged self ambulatory in satisfactory condition

## 2020-10-02 ENCOUNTER — Inpatient Hospital Stay (HOSPITAL_COMMUNITY): Payer: Medicare Other

## 2020-10-02 ENCOUNTER — Encounter (HOSPITAL_COMMUNITY): Payer: Self-pay

## 2020-10-02 ENCOUNTER — Ambulatory Visit (HOSPITAL_COMMUNITY): Payer: Medicare Other

## 2020-10-02 DIAGNOSIS — Z23 Encounter for immunization: Secondary | ICD-10-CM | POA: Diagnosis not present

## 2020-10-02 DIAGNOSIS — Z5112 Encounter for antineoplastic immunotherapy: Secondary | ICD-10-CM | POA: Diagnosis not present

## 2020-10-02 DIAGNOSIS — Z79811 Long term (current) use of aromatase inhibitors: Secondary | ICD-10-CM | POA: Diagnosis not present

## 2020-10-02 DIAGNOSIS — C50922 Malignant neoplasm of unspecified site of left male breast: Secondary | ICD-10-CM | POA: Diagnosis not present

## 2020-10-02 DIAGNOSIS — C9000 Multiple myeloma not having achieved remission: Secondary | ICD-10-CM | POA: Diagnosis not present

## 2020-10-02 DIAGNOSIS — E860 Dehydration: Secondary | ICD-10-CM

## 2020-10-02 DIAGNOSIS — C7951 Secondary malignant neoplasm of bone: Secondary | ICD-10-CM

## 2020-10-02 DIAGNOSIS — Z170 Estrogen receptor positive status [ER+]: Secondary | ICD-10-CM | POA: Diagnosis not present

## 2020-10-02 LAB — KAPPA/LAMBDA LIGHT CHAINS
Kappa free light chain: 8.2 mg/L (ref 3.3–19.4)
Kappa, lambda light chain ratio: 4.56 — ABNORMAL HIGH (ref 0.26–1.65)
Lambda free light chains: 1.8 mg/L — ABNORMAL LOW (ref 5.7–26.3)

## 2020-10-02 MED ORDER — HEPARIN SOD (PORK) LOCK FLUSH 100 UNIT/ML IV SOLN
500.0000 [IU] | Freq: Once | INTRAVENOUS | Status: AC
  Administered 2020-10-02: 500 [IU] via INTRAVENOUS

## 2020-10-02 MED ORDER — SODIUM CHLORIDE 0.9 % IV SOLN: INTRAVENOUS | Status: DC

## 2020-10-02 MED ORDER — MAGNESIUM SULFATE 2 GM/50ML IV SOLN
2.0000 g | Freq: Once | INTRAVENOUS | Status: AC
  Administered 2020-10-02: 2 g via INTRAVENOUS
  Filled 2020-10-02: qty 50

## 2020-10-02 MED ORDER — SODIUM CHLORIDE 0.9% FLUSH
10.0000 mL | INTRAVENOUS | Status: DC | PRN
  Administered 2020-10-02: 10 mL via INTRAVENOUS

## 2020-10-02 MED ORDER — DENOSUMAB 120 MG/1.7ML ~~LOC~~ SOLN
120.0000 mg | Freq: Once | SUBCUTANEOUS | Status: AC
  Administered 2020-10-02: 120 mg via SUBCUTANEOUS
  Filled 2020-10-02: qty 1.7

## 2020-10-02 NOTE — Patient Instructions (Signed)
[facility] at [facility]  Discharge Instructions:   _______________________________________________________________  Thank you for choosing [facility] at [facility] to provide your oncology and hematology care.  To afford each patient quality time with our providers, please arrive at least 15 minutes before your scheduled appointment.  You need to re-schedule your appointment if you arrive 10 or more minutes late.  We strive to give you quality time with our providers, and arriving late affects you and other patients whose appointments are after yours.  Also, if you no show three or more times for appointments you may be dismissed from the clinic.  Again, thank you for choosing  Cancer Center at [facility]. Our hope is that these requests will allow you access to exceptional care and in a timely manner. _______________________________________________________________  If you have questions after your visit, please contact our office at [phone] between the hours of 8:30 a.m. and 5:00 p.m. Voicemails left after 4:30 p.m. will not be returned until the following business day. _______________________________________________________________  For prescription refill requests, have your pharmacy contact our office. _______________________________________________________________  Recommendations made by the consultant and any test results will be sent to your referring physician. _______________________________________________________________ 

## 2020-10-02 NOTE — Progress Notes (Signed)
X-geva due today.  Calcium level is 9.3.  Magnesium 1.5.  Magnesium 2g given IV.   Johnny Lucas presents today for injection per the provider's orders.  X-geva in abdomen administration without incident; injection site WNL; see MAR for injection details.  Patient tolerated procedure well and without incident.  No questions or complaints noted at this time.

## 2020-10-03 LAB — PROTEIN ELECTROPHORESIS, SERUM
A/G Ratio: 1.7 (ref 0.7–1.7)
Albumin ELP: 3.1 g/dL (ref 2.9–4.4)
Alpha-1-Globulin: 0.2 g/dL (ref 0.0–0.4)
Alpha-2-Globulin: 0.6 g/dL (ref 0.4–1.0)
Beta Globulin: 0.7 g/dL (ref 0.7–1.3)
Gamma Globulin: 0.2 g/dL — ABNORMAL LOW (ref 0.4–1.8)
Globulin, Total: 1.8 g/dL — ABNORMAL LOW (ref 2.2–3.9)
M-Spike, %: <0.1 g/dL
Total Protein ELP: 4.9 g/dL — ABNORMAL LOW (ref 6.0–8.5)

## 2020-10-04 ENCOUNTER — Other Ambulatory Visit (HOSPITAL_COMMUNITY): Payer: Self-pay | Admitting: *Deleted

## 2020-10-04 DIAGNOSIS — Z170 Estrogen receptor positive status [ER+]: Secondary | ICD-10-CM

## 2020-10-04 DIAGNOSIS — C50922 Malignant neoplasm of unspecified site of left male breast: Secondary | ICD-10-CM

## 2020-10-04 MED ORDER — TAMOXIFEN CITRATE 20 MG PO TABS: 20.0000 mg | ORAL_TABLET | Freq: Every day | ORAL | 3 refills | Status: DC

## 2020-10-15 ENCOUNTER — Inpatient Hospital Stay (HOSPITAL_COMMUNITY): Payer: Medicare Other

## 2020-10-15 ENCOUNTER — Encounter (HOSPITAL_COMMUNITY): Payer: Self-pay

## 2020-10-15 ENCOUNTER — Other Ambulatory Visit: Payer: Self-pay

## 2020-10-15 VITALS — BP 133/69 | HR 65 | Temp 97.2°F | Resp 18 | Wt 204.8 lb

## 2020-10-15 DIAGNOSIS — Z23 Encounter for immunization: Secondary | ICD-10-CM | POA: Diagnosis not present

## 2020-10-15 DIAGNOSIS — C9000 Multiple myeloma not having achieved remission: Secondary | ICD-10-CM | POA: Diagnosis not present

## 2020-10-15 DIAGNOSIS — Z79811 Long term (current) use of aromatase inhibitors: Secondary | ICD-10-CM | POA: Diagnosis not present

## 2020-10-15 DIAGNOSIS — C50922 Malignant neoplasm of unspecified site of left male breast: Secondary | ICD-10-CM | POA: Diagnosis not present

## 2020-10-15 DIAGNOSIS — Z170 Estrogen receptor positive status [ER+]: Secondary | ICD-10-CM | POA: Diagnosis not present

## 2020-10-15 DIAGNOSIS — Z5112 Encounter for antineoplastic immunotherapy: Secondary | ICD-10-CM | POA: Diagnosis not present

## 2020-10-15 LAB — CBC WITH DIFFERENTIAL/PLATELET
Abs Immature Granulocytes: 0.01 10*3/uL (ref 0.00–0.07)
Basophils Absolute: 0.1 10*3/uL (ref 0.0–0.1)
Basophils Relative: 3 %
Eosinophils Absolute: 0.1 10*3/uL (ref 0.0–0.5)
Eosinophils Relative: 5 %
HCT: 33.7 % — ABNORMAL LOW (ref 39.0–52.0)
Hemoglobin: 11.1 g/dL — ABNORMAL LOW (ref 13.0–17.0)
Immature Granulocytes: 0 %
Lymphocytes Relative: 11 %
Lymphs Abs: 0.3 10*3/uL — ABNORMAL LOW (ref 0.7–4.0)
MCH: 35.6 pg — ABNORMAL HIGH (ref 26.0–34.0)
MCHC: 32.9 g/dL (ref 30.0–36.0)
MCV: 108 fL — ABNORMAL HIGH (ref 80.0–100.0)
Monocytes Absolute: 0.6 10*3/uL (ref 0.1–1.0)
Monocytes Relative: 19 %
Neutro Abs: 1.9 10*3/uL (ref 1.7–7.7)
Neutrophils Relative %: 62 %
Platelets: 188 10*3/uL (ref 150–400)
RBC: 3.12 MIL/uL — ABNORMAL LOW (ref 4.22–5.81)
RDW: 16.4 % — ABNORMAL HIGH (ref 11.5–15.5)
WBC: 3 10*3/uL — ABNORMAL LOW (ref 4.0–10.5)
nRBC: 0 % (ref 0.0–0.2)

## 2020-10-15 LAB — MAGNESIUM: Magnesium: 1.9 mg/dL (ref 1.7–2.4)

## 2020-10-15 LAB — COMPREHENSIVE METABOLIC PANEL
ALT: 13 U/L (ref 0–44)
AST: 13 U/L — ABNORMAL LOW (ref 15–41)
Albumin: 3.6 g/dL (ref 3.5–5.0)
Alkaline Phosphatase: 27 U/L — ABNORMAL LOW (ref 38–126)
Anion gap: 9 (ref 5–15)
BUN: 16 mg/dL (ref 8–23)
CO2: 26 mmol/L (ref 22–32)
Calcium: 9 mg/dL (ref 8.9–10.3)
Chloride: 104 mmol/L (ref 98–111)
Creatinine, Ser: 1.57 mg/dL — ABNORMAL HIGH (ref 0.61–1.24)
GFR, Estimated: 43 mL/min — ABNORMAL LOW (ref >60)
Glucose, Bld: 116 mg/dL — ABNORMAL HIGH (ref 70–99)
Potassium: 4.3 mmol/L (ref 3.5–5.1)
Sodium: 139 mmol/L (ref 135–145)
Total Bilirubin: 0.6 mg/dL (ref 0.3–1.2)
Total Protein: 5.6 g/dL — ABNORMAL LOW (ref 6.5–8.1)

## 2020-10-15 MED ORDER — DEXAMETHASONE 4 MG PO TABS: 20.0000 mg | ORAL_TABLET | Freq: Once | ORAL | Status: DC

## 2020-10-15 MED ORDER — DARATUMUMAB-HYALURONIDASE-FIHJ 1800-30000 MG-UT/15ML ~~LOC~~ SOLN
1800.0000 mg | Freq: Once | SUBCUTANEOUS | Status: AC
  Administered 2020-10-15: 1800 mg via SUBCUTANEOUS
  Filled 2020-10-15: qty 15

## 2020-10-15 MED ORDER — DIPHENHYDRAMINE HCL 25 MG PO CAPS: 50.0000 mg | ORAL_CAPSULE | Freq: Once | ORAL | Status: DC

## 2020-10-15 MED ORDER — ACETAMINOPHEN 325 MG PO TABS: 650.0000 mg | ORAL_TABLET | Freq: Once | ORAL | Status: DC

## 2020-10-15 NOTE — Patient Instructions (Signed)
[facility] Discharge Instructions for Patients Receiving Chemotherapy  Today you received the following chemotherapy agents   To help prevent nausea and vomiting after your treatment, we encourage you to take your nausea medication   If you develop nausea and vomiting that is not controlled by your nausea medication, call the clinic.   BELOW ARE SYMPTOMS THAT SHOULD BE REPORTED IMMEDIATELY:  *FEVER GREATER THAN 100.5 F  *CHILLS WITH OR WITHOUT FEVER  NAUSEA AND VOMITING THAT IS NOT CONTROLLED WITH YOUR NAUSEA MEDICATION  *UNUSUAL SHORTNESS OF BREATH  *UNUSUAL BRUISING OR BLEEDING  TENDERNESS IN MOUTH AND THROAT WITH OR WITHOUT PRESENCE OF ULCERS  *URINARY PROBLEMS  *BOWEL PROBLEMS  UNUSUAL RASH Items with * indicate a potential emergency and should be followed up as soon as possible.  Feel free to call the clinic should you have any questions or concerns. The clinic phone number is [phone].  Please show the CHEMO ALERT CARD at check-in to the Emergency Department and triage nurse.   

## 2020-10-15 NOTE — Progress Notes (Signed)
Labs reviewed with MD today. Proceed as planned per MD. Patient took pre-medications at home.   Patient is taking Pomalyst and has not missed any doses and reports no side effects at this time.   Treatment given per orders. Patient tolerated it well without problems. Vitals stable and discharged home from clinic ambulatory in stable condition.  Follow up as scheduled.

## 2020-10-19 ENCOUNTER — Other Ambulatory Visit (HOSPITAL_COMMUNITY): Payer: Self-pay | Admitting: Hematology

## 2020-10-19 DIAGNOSIS — I10 Essential (primary) hypertension: Secondary | ICD-10-CM

## 2020-10-19 DIAGNOSIS — G4700 Insomnia, unspecified: Secondary | ICD-10-CM

## 2020-10-23 NOTE — Telephone Encounter (Signed)
Chart reviewed. Pomalyst refilled per Dr. Delton Coombes

## 2020-10-29 ENCOUNTER — Inpatient Hospital Stay (HOSPITAL_BASED_OUTPATIENT_CLINIC_OR_DEPARTMENT_OTHER): Payer: Medicare Other | Admitting: Hematology

## 2020-10-29 ENCOUNTER — Other Ambulatory Visit: Payer: Self-pay

## 2020-10-29 ENCOUNTER — Inpatient Hospital Stay (HOSPITAL_COMMUNITY): Payer: Medicare Other | Attending: Hematology

## 2020-10-29 ENCOUNTER — Inpatient Hospital Stay (HOSPITAL_COMMUNITY): Payer: Medicare Other

## 2020-10-29 ENCOUNTER — Encounter (HOSPITAL_COMMUNITY): Payer: Self-pay | Admitting: Hematology

## 2020-10-29 VITALS — BP 134/79 | HR 65 | Temp 97.1°F | Resp 18 | Wt 207.1 lb

## 2020-10-29 DIAGNOSIS — K219 Gastro-esophageal reflux disease without esophagitis: Secondary | ICD-10-CM | POA: Diagnosis not present

## 2020-10-29 DIAGNOSIS — G629 Polyneuropathy, unspecified: Secondary | ICD-10-CM | POA: Diagnosis not present

## 2020-10-29 DIAGNOSIS — C9000 Multiple myeloma not having achieved remission: Secondary | ICD-10-CM | POA: Diagnosis not present

## 2020-10-29 DIAGNOSIS — Z79899 Other long term (current) drug therapy: Secondary | ICD-10-CM | POA: Diagnosis not present

## 2020-10-29 DIAGNOSIS — Z923 Personal history of irradiation: Secondary | ICD-10-CM | POA: Insufficient documentation

## 2020-10-29 DIAGNOSIS — Z5112 Encounter for antineoplastic immunotherapy: Secondary | ICD-10-CM | POA: Insufficient documentation

## 2020-10-29 DIAGNOSIS — Z79810 Long term (current) use of selective estrogen receptor modulators (SERMs): Secondary | ICD-10-CM | POA: Insufficient documentation

## 2020-10-29 DIAGNOSIS — Z853 Personal history of malignant neoplasm of breast: Secondary | ICD-10-CM | POA: Diagnosis not present

## 2020-10-29 DIAGNOSIS — F419 Anxiety disorder, unspecified: Secondary | ICD-10-CM | POA: Diagnosis not present

## 2020-10-29 DIAGNOSIS — Z79811 Long term (current) use of aromatase inhibitors: Secondary | ICD-10-CM | POA: Diagnosis not present

## 2020-10-29 DIAGNOSIS — Z9221 Personal history of antineoplastic chemotherapy: Secondary | ICD-10-CM | POA: Diagnosis not present

## 2020-10-29 DIAGNOSIS — D649 Anemia, unspecified: Secondary | ICD-10-CM | POA: Diagnosis not present

## 2020-10-29 DIAGNOSIS — Z9484 Stem cells transplant status: Secondary | ICD-10-CM | POA: Diagnosis not present

## 2020-10-29 LAB — COMPREHENSIVE METABOLIC PANEL
ALT: 13 U/L (ref 0–44)
AST: 12 U/L — ABNORMAL LOW (ref 15–41)
Albumin: 3.3 g/dL — ABNORMAL LOW (ref 3.5–5.0)
Alkaline Phosphatase: 26 U/L — ABNORMAL LOW (ref 38–126)
Anion gap: 6 (ref 5–15)
BUN: 11 mg/dL (ref 8–23)
CO2: 25 mmol/L (ref 22–32)
Calcium: 8.6 mg/dL — ABNORMAL LOW (ref 8.9–10.3)
Chloride: 106 mmol/L (ref 98–111)
Creatinine, Ser: 1.44 mg/dL — ABNORMAL HIGH (ref 0.61–1.24)
GFR, Estimated: 52 mL/min — ABNORMAL LOW (ref >60)
Glucose, Bld: 122 mg/dL — ABNORMAL HIGH (ref 70–99)
Potassium: 4.1 mmol/L (ref 3.5–5.1)
Sodium: 137 mmol/L (ref 135–145)
Total Bilirubin: 0.4 mg/dL (ref 0.3–1.2)
Total Protein: 5.3 g/dL — ABNORMAL LOW (ref 6.5–8.1)

## 2020-10-29 LAB — CBC WITH DIFFERENTIAL/PLATELET
Abs Immature Granulocytes: 0.01 10*3/uL (ref 0.00–0.07)
Basophils Absolute: 0.1 10*3/uL (ref 0.0–0.1)
Basophils Relative: 2 %
Eosinophils Absolute: 0.5 10*3/uL (ref 0.0–0.5)
Eosinophils Relative: 16 %
HCT: 33.3 % — ABNORMAL LOW (ref 39.0–52.0)
Hemoglobin: 10.9 g/dL — ABNORMAL LOW (ref 13.0–17.0)
Immature Granulocytes: 0 %
Lymphocytes Relative: 17 %
Lymphs Abs: 0.6 10*3/uL — ABNORMAL LOW (ref 0.7–4.0)
MCH: 35 pg — ABNORMAL HIGH (ref 26.0–34.0)
MCHC: 32.7 g/dL (ref 30.0–36.0)
MCV: 107.1 fL — ABNORMAL HIGH (ref 80.0–100.0)
Monocytes Absolute: 0.5 10*3/uL (ref 0.1–1.0)
Monocytes Relative: 14 %
Neutro Abs: 1.7 10*3/uL (ref 1.7–7.7)
Neutrophils Relative %: 51 %
Platelets: 168 10*3/uL (ref 150–400)
RBC: 3.11 MIL/uL — ABNORMAL LOW (ref 4.22–5.81)
RDW: 15.3 % (ref 11.5–15.5)
WBC: 3.3 10*3/uL — ABNORMAL LOW (ref 4.0–10.5)
nRBC: 0 % (ref 0.0–0.2)

## 2020-10-29 LAB — MAGNESIUM: Magnesium: 1.7 mg/dL (ref 1.7–2.4)

## 2020-10-29 LAB — LACTATE DEHYDROGENASE: LDH: 124 U/L (ref 98–192)

## 2020-10-29 MED ORDER — DARATUMUMAB-HYALURONIDASE-FIHJ 1800-30000 MG-UT/15ML ~~LOC~~ SOLN
1800.0000 mg | Freq: Once | SUBCUTANEOUS | Status: AC
  Administered 2020-10-29: 1800 mg via SUBCUTANEOUS
  Filled 2020-10-29: qty 15

## 2020-10-29 MED ORDER — DIPHENHYDRAMINE HCL 25 MG PO CAPS: 50.0000 mg | ORAL_CAPSULE | Freq: Once | ORAL | Status: DC

## 2020-10-29 MED ORDER — DEXAMETHASONE 4 MG PO TABS: 20.0000 mg | ORAL_TABLET | Freq: Once | ORAL | Status: DC

## 2020-10-29 MED ORDER — ACETAMINOPHEN 325 MG PO TABS: 650.0000 mg | ORAL_TABLET | Freq: Once | ORAL | Status: DC

## 2020-10-29 NOTE — Progress Notes (Signed)
Johnny Lucas, [place] [address]   CLINIC:  Medical Oncology/Hematology  PCP:  [name], MD [address] [place] [address] [phone]   REASON FOR VISIT:  Follow-up for multiple myeloma  PRIOR THERAPY:  1. RVD x 6 cycles from 03/01/2017 to 06/29/2017. 2. Stem cell transplant on 09/30/2017. 3. Carfilzomib and cyclophosphamide x 2 cycles from 02/14/2018 to 03/29/2018. 4. Carfilzomib x 22 cycles from 04/20/2018 to 01/23/2020.  NGS Results: Not done  CURRENT THERAPY: Darzalex Faspro every 2 weeks; Pomalyst 3 weeks on, 1 week off  BRIEF ONCOLOGIC HISTORY:  Oncology History  Breast cancer, male ([name])  12/31/2009 Initial Biopsy   Biopsy of L breast    12/31/2009 Pathology Results   Invasive ductal carcinoma, ER/PR+, HER 2 negative   12/31/2009 Imaging   Ultrasound showing a 2.43 x 1.85 x 3 cm hypoechoic spiculated mass in the 12 o clock L breast retroareolar region   01/01/2010 -  Anti-estrogen oral therapy   Tamoxifen 20 mg daily   01/06/2010 Imaging   Bone scan abnormal uptake in the diaphysis of the R humerus, abnormal in the R third, fifth and sixth ribs, lesion also noted in the sternum.   02/03/2010 Surgery   Rod placement and fixation of R humerus by Dr. [name]   02/05/2010 - 02/18/2010 Radiation Therapy   30Gy in 10 fractions of 3 Gy per fraction to R pathologic fracture   03/11/2010 -  Chemotherapy   Denosumab monthly, now every 3 months. Started at [facility]    06/09/2016 Imaging   Three hypermetabolic osseous lesions in the sternum, left ilium and right ilium, as discussed above, likely represent osseous metastases. At this time, these are not recognizable on the CT images. 2. No extra skeletal metastatic disease identified in the neck, chest, abdomen or pelvis.   10/13/2016 Progression   PET shows various new and enlarging osseous metastatic lesions with no definite extra osseous metastatic disease currently identified.      12/31/2016 Progression   1. Multifocal hypermetabolic osseous metastases throughout the axial and proximal appendicular skeleton, which are increased in size, number and metabolism since 10/13/2016 PET-CT. 2. 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 3. No additional sites of hypermetabolic metastatic disease. 4. Chronic right mastoid sinusitis. 5. Aortic atherosclerosis.  One vessel coronary atherosclerosis.   Multiple myeloma ([name])  02/12/2017 Bone Marrow Biopsy   The marrow was variably cellular with large peritrabecular aggregates of kappa restricted plasma cells (66% by aspirate, 30% by Cd138). Cytogenetics +11.    03/01/2017 - 06/29/2017 Chemotherapy   RVD    05/26/2017 Bone Marrow Biopsy   Performed at [facility]:  Plasma cell myeloma in a 30% cellularmarrow with decreased trilineage hematopoiesis and 42% kappalight chain restricted plasma cells on the aspirate smears andlarge aggregates on the core biopsy.    07/12/2017 - 09/01/2017 Chemotherapy   3 cycles of carfizolmib/cyclophosphamide/dexamethasone     10/08/2017 Bone Marrow Transplant   Autotransplant at [facility]   02/28/2020 -  Chemotherapy   The patient had dexamethasone (DECADRON) tablet 20 mg, 20 mg, Oral,  Once, 2 of 2 cycles dexamethasone (DECADRON) tablet 20 mg, 20 mg, Oral, Once, 15 of 16 cycles Administration: 20 mg (03/25/2020), 20 mg (08/05/2020) daratumumab-hyaluronidase-fihj (DARZALEX FASPRO) 1800-30000 MG-UT/15ML chemo SQ injection 1,800 mg, 1,800 mg, Subcutaneous,  Once, 15 of 16 cycles Administration:  1,800 mg (02/28/2020), 1,800 mg (03/05/2020), 1,800 mg (03/12/2020), 1,800 mg (03/18/2020), 1,800 mg (03/25/2020), 1,800 mg (04/01/2020), 1,800 mg (04/29/2020), 1,800 mg (04/08/2020), 1,800 mg (04/15/2020), 1,800 mg (04/22/2020), 1,800 mg (05/28/2020), 1,800 mg (06/11/2020), 1,800 mg  (06/24/2020), 1,800 mg (07/08/2020), 1,800 mg (07/22/2020), 1,800 mg (08/05/2020), 1,800 mg (08/19/2020), 1,800 mg (09/03/2020), 1,800 mg (09/17/2020), 1,800 mg (10/01/2020), 1,800 mg (10/15/2020) bortezomib SQ (VELCADE) chemo injection 2.75 mg, 1.3 mg/m2 = 2.75 mg, Subcutaneous,  Once, 4 of 4 cycles Administration: 2.75 mg (02/28/2020), 2.75 mg (03/05/2020), 2.75 mg (03/08/2020), 2.75 mg (03/18/2020), 2.75 mg (03/21/2020), 2.75 mg (03/25/2020), 2.75 mg (03/28/2020), 2.75 mg (04/29/2020), 2.75 mg (05/02/2020), 2.75 mg (05/06/2020), 2.75 mg (05/09/2020), 2.75 mg (04/08/2020), 2.75 mg (04/11/2020), 2.75 mg (04/15/2020), 2.75 mg (04/18/2020)  for chemotherapy treatment.    Multiple myeloma not having achieved remission ([name])  06/29/2017 Initial Diagnosis   Multiple myeloma not having achieved remission ([name])   02/28/2020 -  Chemotherapy   The patient had dexamethasone (DECADRON) tablet 20 mg, 20 mg, Oral,  Once, 2 of 2 cycles dexamethasone (DECADRON) tablet 20 mg, 20 mg, Oral, Once, 15 of 16 cycles Administration: 20 mg (03/25/2020), 20 mg (08/05/2020) daratumumab-hyaluronidase-fihj (DARZALEX FASPRO) 1800-30000 MG-UT/15ML chemo SQ injection 1,800 mg, 1,800 mg, Subcutaneous,  Once, 15 of 16 cycles Administration: 1,800 mg (02/28/2020), 1,800 mg (03/05/2020), 1,800 mg (03/12/2020), 1,800 mg (03/18/2020), 1,800 mg (03/25/2020), 1,800 mg (04/01/2020), 1,800 mg (04/29/2020), 1,800 mg (04/08/2020), 1,800 mg (04/15/2020), 1,800 mg (04/22/2020), 1,800 mg (05/28/2020), 1,800 mg (06/11/2020), 1,800 mg (06/24/2020), 1,800 mg (07/08/2020), 1,800 mg (07/22/2020), 1,800 mg (08/05/2020), 1,800 mg (08/19/2020), 1,800 mg (09/03/2020), 1,800 mg (09/17/2020), 1,800 mg (10/01/2020), 1,800 mg (10/15/2020) bortezomib SQ (VELCADE) chemo injection 2.75 mg, 1.3 mg/m2 = 2.75 mg, Subcutaneous,  Once, 4 of 4 cycles Administration: 2.75 mg (02/28/2020), 2.75 mg (03/05/2020), 2.75 mg (03/08/2020), 2.75 mg (03/18/2020), 2.75 mg (03/21/2020), 2.75 mg (03/25/2020), 2.75 mg (03/28/2020), 2.75 mg (04/29/2020),  2.75 mg (05/02/2020), 2.75 mg (05/06/2020), 2.75 mg (05/09/2020), 2.75 mg (04/08/2020), 2.75 mg (04/11/2020), 2.75 mg (04/15/2020), 2.75 mg (04/18/2020)  for chemotherapy treatment.      CANCER STAGING: Cancer Staging Multiple myeloma ([place]) Staging form: Plasma Cell Myeloma and Plasma Cell Disorders, AJCC 8th Edition - Clinical stage from 05/03/2017: Beta-2-microglobulin (mg/L): 3.5, Albumin (g/dL): 3.4, ISS: Stage II, High-risk cytogenetics: Absent, LDH: Not assessed - Signed by Baird Cancer, PA-C on 05/03/2017   INTERVAL HISTORY:  Johnny Lucas, a 74 y.o. male, returns for routine follow-up and consideration for next cycle of immunotherapy. Polk was last seen on 10/01/2020.  Due for cycle #16 of Darzalex Faspro today.   Overall, he tells me he has been feeling pretty well. He reports having diarrhea on Thursday and Friday, and then once yesterday along with one this AM; he is getting watery diarrhea 2-3 episodes per day. He reports feeling drained by it. He is currently on his third week on Pomalyst. His energy levels have been improving over the past 6 weeks and he is tolerating yard work now.  Overall, he feels ready for next cycle of immunotherapy today.    REVIEW OF SYSTEMS:  Review of Systems  Constitutional: Positive for appetite change (75%, improving) and fatigue (75%).  Gastrointestinal: Positive for diarrhea (2-3 episodes of watery BM 3 days).  Neurological: Positive for numbness.  All other systems reviewed and are negative.   PAST MEDICAL/SURGICAL HISTORY:  Past Medical History:  Diagnosis Date  . Anxiety   . Bone metastases ([place]) 09/10/2016  . Breast cancer (  [place]) 2011   Stage IV breast cancer; radiation and tamoxifen  . Breast cancer, male ([place])    Stage IV breast cancer; radiation and tamoxifen  Overview:  Left breast ca with mets bone  Overview:  METS TO BONE  . GERD (gastroesophageal reflux disease)   . Hypertension   . Macular degeneration   . Multiple  myeloma ([place]) 02/18/2017  . Peripheral neuropathy    Past Surgical History:  Procedure Laterality Date  . BACK SURGERY    . CATARACT EXTRACTION W/PHACO Left 02/03/2019   Procedure: CATARACT EXTRACTION PHACO AND INTRAOCULAR LENS PLACEMENT ([place]);  Surgeon: Baruch Goldmann, MD;  Location: [facility];  Service: Ophthalmology;  Laterality: Left;  CDE: 15.89  . HERNIA REPAIR    . PORTACATH PLACEMENT Left 06/10/2018   Procedure: INSERTION PORT-A-CATH;  Surgeon: Virl Cagey, MD;  Location: [facility];  Service: General;  Laterality: Left;  . right arm surgery      SOCIAL HISTORY:  Social History   Socioeconomic History  . Marital status: Married    Spouse name: Not on file  . Number of children: 3  . Years of education: Not on file  . Highest education level: Not on file  Occupational History  . Not on file  Tobacco Use  . Smoking status: Never Smoker  . Smokeless tobacco: Former Network engineer and Sexual Activity  . Alcohol use: Yes    Alcohol/week: 24.0 standard drinks    Types: 24 Cans of beer per week  . Drug use: No  . Sexual activity: Not on file  Other Topics Concern  . Not on file  Social History Narrative  . Not on file   Social Determinants of Health   Financial Resource Strain:   . Difficulty of Paying Living Expenses: Not on file  Food Insecurity:   . Worried About Charity fundraiser in the Last Year: Not on file  . Ran Out of Food in the Last Year: Not on file  Transportation Needs:   . Lack of Transportation (Medical): Not on file  . Lack of Transportation (Non-Medical): Not on file  Physical Activity:   . Days of Exercise per Week: Not on file  . Minutes of Exercise per Session: Not on file  Stress:   . Feeling of Stress : Not on file  Social Connections:   . Frequency of Communication with Friends and Family: Not on file  . Frequency of Social Gatherings with Friends and Family: Not on file  . Attends Religious Services: Not on file  . Active Member of  Clubs or Organizations: Not on file  . Attends Archivist Meetings: Not on file  . Marital Status: Not on file  Intimate Partner Violence:   . Fear of Current or Ex-Partner: Not on file  . Emotionally Abused: Not on file  . Physically Abused: Not on file  . Sexually Abused: Not on file    FAMILY HISTORY:  Family History  Problem Relation Age of Onset  . Stroke Mother   . Cancer Maternal Aunt        cancer NOS; died in her 38s  . Lung cancer Maternal Uncle        smoker    CURRENT MEDICATIONS:  Current Outpatient Medications  Medication Sig Dispense Refill  . acyclovir (ZOVIRAX) 800 MG tablet TAKE 1 TABLET TWICE DAILY 180 tablet 3  . aspirin EC 81 MG tablet Take 81 mg by mouth daily.     Marland Kitchen  folic acid (FOLVITE) 1 MG tablet TAKE 1 TABLET BY MOUTH DAILY 90 tablet 2  . gabapentin (NEURONTIN) 300 MG capsule Take 1 capsule (300 mg total) by mouth 3 (three) times daily. 180 capsule 2  . loratadine (CLARITIN) 10 MG tablet Take 1 tablet (10 mg total) by mouth daily. 90 tablet 1  . metoprolol succinate (TOPROL-XL) 100 MG 24 hr tablet Take 100 mg by mouth daily.     Marland Kitchen POMALYST 2 MG capsule TAKE 1 CAPSULE BY MOUTH EVERY DAY 21 capsule 0  . tamoxifen (NOLVADEX) 20 MG tablet Take 1 tablet (20 mg total) by mouth daily. 90 tablet 3  . acetaminophen (TYLENOL) 500 MG tablet Take 500 mg by mouth every 6 (six) hours as needed.  (Patient not taking: Reported on 10/29/2020)    . ALPRAZolam (XANAX) 0.5 MG tablet TAKE 1 TABLET BY MOUTH AT BEDTIME AS NEEDED FOR ANXIETY OR SLEEP (Patient not taking: Reported on 10/29/2020) 30 tablet 3  . augmented betamethasone dipropionate (DIPROLENE-AF) 0.05 % cream Apply topically as needed. (Patient not taking: Reported on 10/29/2020) 30 g 6  . calcium-vitamin D (OSCAL WITH D) 500-200 MG-UNIT TABS tablet TAKE TWO TABLETS BY MOUTH DAILY (Patient not taking: Reported on 10/29/2020) 60 tablet 6  . Cholecalciferol (VITAMIN D) 50 MCG (2000 UT) CAPS Take 4,000 Units by  mouth 2 (two) times daily.  (Patient not taking: Reported on 10/29/2020)    . DARATUMUMAB-HYALURONIDASE-FIHJ [place] Inject 1,800 mg into the skin every 14 (fourteen) days.     Marland Kitchen denosumab (XGEVA) 120 MG/1.7ML SOLN injection Inject 120 mg into the skin every 30 (thirty) days.     Marland Kitchen dexamethasone (DECADRON) 4 MG tablet Take 5 pills before each treatment (Patient taking differently: Take 5 pills weekly.) 40 tablet 5  . diclofenac Sodium (VOLTAREN) 1 % GEL Use 1 to 2 times per day as directed by doctor (Patient not taking: Reported on 10/29/2020)    . hydrALAZINE (APRESOLINE) 50 MG tablet Take 1 tablet (50 mg total) by mouth every 8 (eight) hours. (Patient not taking: Reported on 10/29/2020) 90 tablet 0  . Multiple Vitamin (MULTIVITAMIN WITH MINERALS) TABS tablet Take 1 tablet by mouth daily.  (Patient not taking: Reported on 10/29/2020)    . ondansetron (ZOFRAN) 8 MG tablet TAKE 1 TABLET BY MOUTH EVERY 8 HOURS AS NEEDED FOR NAUSEA AND VOMITING (Patient not taking: Reported on 10/01/2020) 30 tablet 2  . pantoprazole (PROTONIX) 40 MG tablet Take 40 mg by mouth every other day.  (Patient not taking: Reported on 10/29/2020)    . polyethylene glycol (MIRALAX / GLYCOLAX) packet Take 17 g by mouth daily as needed for mild constipation. (Patient not taking: Reported on 10/29/2020) 14 each 0  . prochlorperazine (COMPAZINE) 10 MG tablet Take 1 tablet (10 mg total) by mouth every 6 (six) hours as needed for nausea or vomiting. (Patient not taking: Reported on 10/01/2020) 30 tablet 2  . pseudoephedrine-guaifenesin (MUCINEX D) 60-600 MG 12 hr tablet Take 1 tablet by mouth 2 (two) times daily as needed for congestion.  (Patient not taking: Reported on 10/29/2020)    . vitamin B-12 (CYANOCOBALAMIN) 1000 MCG tablet Take 1,000 mcg by mouth daily.  (Patient not taking: Reported on 10/29/2020)     No current facility-administered medications for this visit.   Facility-Administered Medications Ordered in Other Visits  Medication Dose  Route Frequency Provider Last Rate Last Admin  . ondansetron (ZOFRAN) 8 mg in sodium chloride 0.9 % 50 mL IVPB  8 mg Intravenous  Once Lockamy, Randi L, NP-C      . sodium chloride flush (NS) 0.9 % injection 10 mL  10 mL Intracatheter Once PRN Derek Jack, MD        ALLERGIES:  Allergies  Allergen Reactions  . Cefepime     Suspected severe thrombocytopenia is a result of cefepime induced antigen platelet destruction    PHYSICAL EXAM:  Performance status (ECOG): 1 - Symptomatic but completely ambulatory  Vitals:   10/29/20 1241  BP: 134/79  Pulse: 65  Resp: 18  Temp: (!) 97.1 F (36.2 C)  SpO2: 99%   Wt Readings from Last 3 Encounters:  10/29/20 207 lb 1.6 oz (93.9 kg)  10/15/20 204 lb 12.8 oz (92.9 kg)  10/01/20 206 lb 9.6 oz (93.7 kg)   Physical Exam Vitals reviewed.  Constitutional:      Appearance: Normal appearance.  Cardiovascular:     Rate and Rhythm: Normal rate and regular rhythm.     Pulses: Normal pulses.     Heart sounds: Normal heart sounds.  Pulmonary:     Effort: Pulmonary effort is normal.     Breath sounds: Normal breath sounds.  Musculoskeletal:     Right lower leg: Edema (trace) present.     Left lower leg: Edema (trace) present.  Neurological:     General: No focal deficit present.     Mental Status: He is alert and oriented to person, place, and time.  Psychiatric:        Mood and Affect: Mood normal.        Behavior: Behavior normal.     LABORATORY DATA:  I have reviewed the labs as listed.  CBC Latest Ref Rng & Units 10/29/2020 10/15/2020 10/01/2020  WBC 4.0 - 10.5 K/uL 3.3(L) 3.0(L) 3.8(L)  Hemoglobin 13.0 - 17.0 g/dL 10.9(L) 11.1(L) 9.9(L)  Hematocrit 39 - 52 % 33.3(L) 33.7(L) 30.2(L)  Platelets 150 - 400 K/uL 168 188 150   CMP Latest Ref Rng & Units 10/29/2020 10/15/2020 10/01/2020  Glucose 70 - 99 mg/dL 122(H) 116(H) 124(H)  BUN 8 - 23 mg/dL 11 16 14   Creatinine 0.61 - 1.24 mg/dL 1.44(H) 1.57(H) 1.52(H)  Sodium 135 - 145  mmol/L 137 139 136  Potassium 3.5 - 5.1 mmol/L 4.1 4.3 4.3  Chloride 98 - 111 mmol/L 106 104 103  CO2 22 - 32 mmol/L 25 26 25   Calcium 8.9 - 10.3 mg/dL 8.6(L) 9.0 9.3  Total Protein 6.5 - 8.1 g/dL 5.3(L) 5.6(L) 5.0(L)  Total Bilirubin 0.3 - 1.2 mg/dL 0.4 0.6 0.6  Alkaline Phos 38 - 126 U/L 26(L) 27(L) 25(L)  AST 15 - 41 U/L 12(L) 13(L) 12(L)  ALT 0 - 44 U/L 13 13 13    Lab Results  Component Value Date   LDH 124 10/29/2020   LDH 112 10/01/2020   LDH 133 09/17/2020   Lab Results  Component Value Date   TOTALPROTELP 4.9 (L) 10/01/2020   ALBUMINELP 3.1 10/01/2020   A1GS 0.2 10/01/2020   A2GS 0.6 10/01/2020   BETS 0.7 10/01/2020   GAMS 0.2 (L) 10/01/2020   MSPIKE <0.1 10/01/2020   SPEI Comment 10/01/2020    Lab Results  Component Value Date   KPAFRELGTCHN 8.2 10/01/2020   LAMBDASER 1.8 (L) 10/01/2020   KAPLAMBRATIO 4.56 (H) 10/01/2020    DIAGNOSTIC IMAGING:  I have independently reviewed the scans and discussed with the patient. No results found.   ASSESSMENT:  1. Relapsed multiple myeloma: -Autologous stem cell transplant on 10/08/2017 -Post transplant consolidation  withCyCarD for 2 cycles with M spike undetectable. -Maintenance therapy with carfilzomib 70 mg per metered square days 1 and 15 every 28 days from 04/20/2018, dose reduced to 35 mg per metered square on 01/05/2019, titrated up to 56 mg per metered square on 01/09/2020. 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 -PET scan on 02/18/2020 showed new left supraclavicular lymph node at the thoracic inlet, SUV 14.2. Multifocal osseous lesions largely improved, though residual lesions noted including right acromion, left acromion, thyroid cartilage, right sternum. Additional lesions throughout the sternum, bilateral ribs, thoracolumbar spine, bilateral pelvis have resolved. -BMBX on 02/14/2020 shows plasma cell myeloma, 60-70% of cells. FISH for myeloma was negative.  Cytogenetics was normal. -4 cycles of daratumumab, bortezomib and dexamethasone from 02/28/2020 through 04/29/2020. -Daratumumab, pomalidomide and dexamethasone started on 05/28/2020. He is receiving daratumumab every 2 weeks, Pomalyst 3 mg days 1-21, dexamethasone 20 mg weekly. -Pomalidomide dose reduced to 2 mg days 1-21 around the first week of July 2021, due to severe weakness.   PLAN:  1. Relapsed IgG kappa multiple myeloma: -He is tolerating pomalidomide 2 mg 3 weeks on/1 week off very well.  He is also continuing dexamethasone 20 mg weekly. -He had 3 episodes of diarrhea, Thursday night, Friday night and last night.  He had 1-2 watery bowel movements. -I have reviewed myeloma labs from 10/01/2020.  M spike is less than 0.1 g.  Free light chain ratio improved to 4.56 from 7. -As he is continuing to be in VGPR, I have recommended switching Darzalex to once every 4 weeks. -He will use Imodium as needed for diarrhea.  Reevaluate in 4 weeks with myeloma labs.  2. Myeloma bone disease: -Continue denosumab monthly.  3. Macrocytic anemia: -Hemoglobin today is 10.9.  Continue Procrit as needed.  4. ID prophylaxis: -Continue acyclovir twice daily.  5. Peripheral neuropathy: -Continue gabapentin 300 mg 3 times a day.  6. History of breast cancer: -He is tolerating tamoxifen without any problems.   Orders placed this encounter:  No orders of the defined types were placed in this encounter.    Derek Jack, MD [place] [phone]   I, Milinda Antis, am acting as a scribe for Dr. Sanda Linger.  I, Derek Jack MD, have reviewed the above documentation for accuracy and completeness, and I agree with the above.

## 2020-10-29 NOTE — Progress Notes (Signed)
Patient was assessed by Dr. Katragadda and labs have been reviewed.  Patient is okay to proceed with treatment today. Primary RN and pharmacy aware.   

## 2020-10-29 NOTE — Patient Instructions (Signed)
[facility] Discharge Instructions for Patients Receiving Chemotherapy   Beginning January 23rd 2017 lab work for the [facility] will be done in the  Main lab at [facility] on 1st floor. If you have a lab appointment with the [facility] please come in thru the  Main Entrance and check in at the main information desk   Today you received the following chemotherapy agents Darzalex injection. Follow-up as scheduled  To help prevent nausea and vomiting after your treatment, we encourage you to take your nausea medication   If you develop nausea and vomiting, or diarrhea that is not controlled by your medication, call the clinic.  The clinic phone number is [phone]. Office hours are Monday-Friday 8:30am-5:00pm.  BELOW ARE SYMPTOMS THAT SHOULD BE REPORTED IMMEDIATELY:  *FEVER GREATER THAN 101.0 F  *CHILLS WITH OR WITHOUT FEVER  NAUSEA AND VOMITING THAT IS NOT CONTROLLED WITH YOUR NAUSEA MEDICATION  *UNUSUAL SHORTNESS OF BREATH  *UNUSUAL BRUISING OR BLEEDING  TENDERNESS IN MOUTH AND THROAT WITH OR WITHOUT PRESENCE OF ULCERS  *URINARY PROBLEMS  *BOWEL PROBLEMS  UNUSUAL RASH Items with * indicate a potential emergency and should be followed up as soon as possible. If you have an emergency after office hours please contact your primary care physician or go to the nearest emergency department.  Please call the clinic during office hours if you have any questions or concerns.   You may also contact the Patient Navigator at [phone] should you have any questions or need assistance in obtaining follow up care.      Resources For Cancer Patients and their Caregivers ? American Cancer Society: Can assist with transportation, wigs, general needs, runs Look Good Feel Better.        512-880-5177 ? Cancer Care: Provides financial assistance, online support groups, medication/co-pay assistance.  1-800-813-HOPE (484)791-5808) ? [facility] Assists [place] cancer patients and their families through emotional , educational and financial support.  989-085-1359 ? [place] DSS Where to apply for food stamps, Medicaid and utility assistance. (623)045-2361 ? [employer]: Transportation to medical appointments. 864-073-6831 ? Social Security Administration: May apply for disability if have a Stage IV cancer. 914-468-4747 406-798-4687 ? [employer], Disability and Transit Services: Assists with nutrition, care and transit needs. 574-262-9659

## 2020-10-29 NOTE — Progress Notes (Signed)
Patient has not had any side effects from pomalyst or tamoxifen other than he has had diarrhea the last few days.

## 2020-10-29 NOTE — Patient Instructions (Signed)
[place] at [facility] Discharge Instructions  You were seen today by Dr. Delton Coombes. He went over your recent results. You received your treatment today; your Darzalex Huel Cote will be changed to every 4 weeks. You will receive your Xgeva injection tomorrow. Continue taking Pomalyst 3 weeks on with 1 week off. Dr. Delton Coombes will see you back in 4 weeks for labs and follow up.   Thank you for choosing [place] at [facility] to provide your oncology and hematology care.  To afford each patient quality time with our provider, please arrive at least 15 minutes before your scheduled appointment time.   If you have a lab appointment with the [facility] please come in thru the Main Entrance and check in at the main information desk  You need to re-schedule your appointment should you arrive 10 or more minutes late.  We strive to give you quality time with our providers, and arriving late affects you and other patients whose appointments are after yours.  Also, if you no show three or more times for appointments you may be dismissed from the clinic at the providers discretion.     Again, thank you for choosing [facility].  Our hope is that these requests will decrease the amount of time that you wait before being seen by our physicians.       _____________________________________________________________  Should you have questions after your visit to [facility], please contact our office at [phone] between the hours of 8:00 a.m. and 4:30 p.m.  Voicemails left after 4:00 p.m. will not be returned until the following business day.  For prescription refill requests, have your pharmacy contact our office and allow 72 hours.    Cancer Center Support Programs:   > Cancer Support Group  2nd Tuesday of the month 1pm-2pm, Journey Room

## 2020-10-29 NOTE — Progress Notes (Signed)
[place] reviewed with and pt seen by Dr. Delton Coombes and pt approved for Darzalex injection today per MD                                [name] tolerated Darzalex injection well without complaints or incident. Pt continues to take his Pomalyst as prescribed without issues. Pt discharged self ambulatory in satisfactory condition

## 2020-10-30 ENCOUNTER — Inpatient Hospital Stay (HOSPITAL_COMMUNITY): Payer: Medicare Other

## 2020-10-30 ENCOUNTER — Encounter (HOSPITAL_COMMUNITY): Payer: Self-pay

## 2020-10-30 ENCOUNTER — Other Ambulatory Visit: Payer: Self-pay

## 2020-10-30 VITALS — BP 143/68 | HR 60 | Temp 98.3°F | Resp 18

## 2020-10-30 DIAGNOSIS — C9000 Multiple myeloma not having achieved remission: Secondary | ICD-10-CM | POA: Diagnosis not present

## 2020-10-30 DIAGNOSIS — Z79810 Long term (current) use of selective estrogen receptor modulators (SERMs): Secondary | ICD-10-CM | POA: Diagnosis not present

## 2020-10-30 DIAGNOSIS — Z5112 Encounter for antineoplastic immunotherapy: Secondary | ICD-10-CM | POA: Diagnosis not present

## 2020-10-30 DIAGNOSIS — K219 Gastro-esophageal reflux disease without esophagitis: Secondary | ICD-10-CM | POA: Diagnosis not present

## 2020-10-30 DIAGNOSIS — G629 Polyneuropathy, unspecified: Secondary | ICD-10-CM | POA: Diagnosis not present

## 2020-10-30 DIAGNOSIS — D649 Anemia, unspecified: Secondary | ICD-10-CM | POA: Diagnosis not present

## 2020-10-30 DIAGNOSIS — C7951 Secondary malignant neoplasm of bone: Secondary | ICD-10-CM

## 2020-10-30 LAB — PROTEIN ELECTROPHORESIS, SERUM
A/G Ratio: 1.6 (ref 0.7–1.7)
Albumin ELP: 3 g/dL (ref 2.9–4.4)
Alpha-1-Globulin: 0.3 g/dL (ref 0.0–0.4)
Alpha-2-Globulin: 0.6 g/dL (ref 0.4–1.0)
Beta Globulin: 0.8 g/dL (ref 0.7–1.3)
Gamma Globulin: 0.2 g/dL — ABNORMAL LOW (ref 0.4–1.8)
Globulin, Total: 1.9 g/dL — ABNORMAL LOW (ref 2.2–3.9)
M-Spike, %: 0.1 g/dL — ABNORMAL HIGH
Total Protein ELP: 4.9 g/dL — ABNORMAL LOW (ref 6.0–8.5)

## 2020-10-30 LAB — KAPPA/LAMBDA LIGHT CHAINS
Kappa free light chain: 4.4 mg/L (ref 3.3–19.4)
Kappa, lambda light chain ratio: 2.59 — ABNORMAL HIGH (ref 0.26–1.65)
Lambda free light chains: 1.7 mg/L — ABNORMAL LOW (ref 5.7–26.3)

## 2020-10-30 MED ORDER — DENOSUMAB 120 MG/1.7ML ~~LOC~~ SOLN
SUBCUTANEOUS | Status: AC
  Filled 2020-10-30: qty 1.7

## 2020-10-30 MED ORDER — DENOSUMAB 120 MG/1.7ML ~~LOC~~ SOLN
120.0000 mg | Freq: Once | SUBCUTANEOUS | Status: AC
  Administered 2020-10-30: 120 mg via SUBCUTANEOUS

## 2020-10-30 NOTE — Patient Instructions (Signed)
[facility] at [facility] Discharge Instructions  Received Xgeva injection today. Follow-up as scheduled   Thank you for choosing [facility] at [facility] to provide your oncology and hematology care.  To afford each patient quality time with our provider, please arrive at least 15 minutes before your scheduled appointment time.   If you have a lab appointment with the Cancer Center please come in thru the Main Entrance and check in at the main information desk.  You need to re-schedule your appointment should you arrive 10 or more minutes late.  We strive to give you quality time with our providers, and arriving late affects you and other patients whose appointments are after yours.  Also, if you no show three or more times for appointments you may be dismissed from the clinic at the providers discretion.     Again, thank you for choosing [facility].  Our hope is that these requests will decrease the amount of time that you wait before being seen by our physicians.       _____________________________________________________________  Should you have questions after your visit to [facility], please contact our office at [phone] and follow the prompts.  Our office hours are 8:00 a.m. and 4:30 p.m. Monday - Friday.  Please note that voicemails left after 4:00 p.m. may not be returned until the following business day.  We are closed weekends and major holidays.  You do have access to a nurse 24-7, just call the main number to the clinic [phone] and do not press any options, hold on the line and a nurse will answer the phone.    For prescription refill requests, have your pharmacy contact our office and allow 72 hours.    Due to Covid, you will need to wear a mask upon entering the hospital. If you do not have a mask, a mask will be given to you at the Main Entrance upon arrival. For doctor visits, patients may have 1  support person age 18 or older with them. For treatment visits, patients can not have anyone with them due to social distancing guidelines and our immunocompromised population.     

## 2020-10-30 NOTE — Progress Notes (Signed)
Johnny Lucas tolerated Xgeva injection well without complaints or incident. Calcium 8.6 yesterday and pt denied any tooth or jaw pain and no recent or future dental visits prior to administering this medication. Pt continues to take his Calcium PO as prescribed without issues.VSS Pt discharged self ambulatory in satisfactory condition

## 2020-11-16 ENCOUNTER — Other Ambulatory Visit (HOSPITAL_COMMUNITY): Payer: Self-pay | Admitting: Hematology

## 2020-11-16 DIAGNOSIS — I10 Essential (primary) hypertension: Secondary | ICD-10-CM

## 2020-11-16 DIAGNOSIS — G4700 Insomnia, unspecified: Secondary | ICD-10-CM

## 2020-11-20 NOTE — Telephone Encounter (Signed)
Chart reviewed. Pomalyst refilled per Dr. Katragadda 

## 2020-11-25 ENCOUNTER — Other Ambulatory Visit (HOSPITAL_COMMUNITY): Payer: Self-pay | Admitting: Hematology

## 2020-11-26 ENCOUNTER — Other Ambulatory Visit: Payer: Self-pay

## 2020-11-26 ENCOUNTER — Inpatient Hospital Stay (HOSPITAL_COMMUNITY): Payer: Medicare Other

## 2020-11-26 ENCOUNTER — Inpatient Hospital Stay (HOSPITAL_BASED_OUTPATIENT_CLINIC_OR_DEPARTMENT_OTHER): Payer: Medicare Other | Admitting: Hematology

## 2020-11-26 ENCOUNTER — Encounter (HOSPITAL_COMMUNITY): Payer: Self-pay | Admitting: Hematology

## 2020-11-26 VITALS — BP 153/81 | HR 53 | Temp 97.0°F | Resp 18 | Wt 208.3 lb

## 2020-11-26 DIAGNOSIS — D649 Anemia, unspecified: Secondary | ICD-10-CM | POA: Diagnosis not present

## 2020-11-26 DIAGNOSIS — Z5112 Encounter for antineoplastic immunotherapy: Secondary | ICD-10-CM | POA: Diagnosis not present

## 2020-11-26 DIAGNOSIS — K219 Gastro-esophageal reflux disease without esophagitis: Secondary | ICD-10-CM | POA: Diagnosis not present

## 2020-11-26 DIAGNOSIS — Z79810 Long term (current) use of selective estrogen receptor modulators (SERMs): Secondary | ICD-10-CM | POA: Diagnosis not present

## 2020-11-26 DIAGNOSIS — G629 Polyneuropathy, unspecified: Secondary | ICD-10-CM | POA: Diagnosis not present

## 2020-11-26 DIAGNOSIS — C9000 Multiple myeloma not having achieved remission: Secondary | ICD-10-CM | POA: Diagnosis not present

## 2020-11-26 LAB — COMPREHENSIVE METABOLIC PANEL
ALT: 14 U/L (ref 0–44)
AST: 13 U/L — ABNORMAL LOW (ref 15–41)
Albumin: 3.6 g/dL (ref 3.5–5.0)
Alkaline Phosphatase: 26 U/L — ABNORMAL LOW (ref 38–126)
Anion gap: 7 (ref 5–15)
BUN: 11 mg/dL (ref 8–23)
CO2: 22 mmol/L (ref 22–32)
Calcium: 8 mg/dL — ABNORMAL LOW (ref 8.9–10.3)
Chloride: 107 mmol/L (ref 98–111)
Creatinine, Ser: 1.23 mg/dL (ref 0.61–1.24)
GFR, Estimated: >60 mL/min (ref >60)
Glucose, Bld: 101 mg/dL — ABNORMAL HIGH (ref 70–99)
Potassium: 4.1 mmol/L (ref 3.5–5.1)
Sodium: 136 mmol/L (ref 135–145)
Total Bilirubin: 0.6 mg/dL (ref 0.3–1.2)
Total Protein: 5.5 g/dL — ABNORMAL LOW (ref 6.5–8.1)

## 2020-11-26 LAB — CBC WITH DIFFERENTIAL/PLATELET
Abs Immature Granulocytes: 0.01 10*3/uL (ref 0.00–0.07)
Basophils Absolute: 0.1 10*3/uL (ref 0.0–0.1)
Basophils Relative: 2 %
Eosinophils Absolute: 0.3 10*3/uL (ref 0.0–0.5)
Eosinophils Relative: 8 %
HCT: 35.7 % — ABNORMAL LOW (ref 39.0–52.0)
Hemoglobin: 11.2 g/dL — ABNORMAL LOW (ref 13.0–17.0)
Immature Granulocytes: 0 %
Lymphocytes Relative: 11 %
Lymphs Abs: 0.4 10*3/uL — ABNORMAL LOW (ref 0.7–4.0)
MCH: 34.7 pg — ABNORMAL HIGH (ref 26.0–34.0)
MCHC: 31.4 g/dL (ref 30.0–36.0)
MCV: 110.5 fL — ABNORMAL HIGH (ref 80.0–100.0)
Monocytes Absolute: 0.5 10*3/uL (ref 0.1–1.0)
Monocytes Relative: 12 %
Neutro Abs: 2.5 10*3/uL (ref 1.7–7.7)
Neutrophils Relative %: 67 %
Platelets: 160 10*3/uL (ref 150–400)
RBC: 3.23 MIL/uL — ABNORMAL LOW (ref 4.22–5.81)
RDW: 15.2 % (ref 11.5–15.5)
WBC: 3.8 10*3/uL — ABNORMAL LOW (ref 4.0–10.5)
nRBC: 0 % (ref 0.0–0.2)

## 2020-11-26 LAB — MAGNESIUM: Magnesium: 2 mg/dL (ref 1.7–2.4)

## 2020-11-26 MED ORDER — ACETAMINOPHEN 325 MG PO TABS: 650.0000 mg | ORAL_TABLET | Freq: Once | ORAL | Status: DC

## 2020-11-26 MED ORDER — DARATUMUMAB-HYALURONIDASE-FIHJ 1800-30000 MG-UT/15ML ~~LOC~~ SOLN
1800.0000 mg | Freq: Once | SUBCUTANEOUS | Status: AC
  Administered 2020-11-26: 1800 mg via SUBCUTANEOUS
  Filled 2020-11-26: qty 15

## 2020-11-26 MED ORDER — DIPHENHYDRAMINE HCL 25 MG PO CAPS: 50.0000 mg | ORAL_CAPSULE | Freq: Once | ORAL | Status: DC

## 2020-11-26 MED ORDER — DEXAMETHASONE 4 MG PO TABS: 20.0000 mg | ORAL_TABLET | Freq: Once | ORAL | Status: DC

## 2020-11-26 NOTE — Progress Notes (Signed)
Patient has been examined, vital signs and labs have been reviewed by Dr. Delton Coombes. ANC, Creatinine, LFTs, hemoglobin, and platelets are within treatment parameters per Dr. Delton Coombes. Patient is okay to proceed with treatment per M.D.   Johnny Lucas presents today for injection per the provider's orders.  [name] administration without incident; injection site WNL; see MAR for injection details.  Patient tolerated procedure well and without incident.  No questions or complaints noted at this time.  Discharged ambulatory in stable condition.

## 2020-11-26 NOTE — Patient Instructions (Signed)
[place] at [facility] Discharge Instructions  You were seen today by Dr. Delton Coombes. He went over your recent results. You received your injection today; continue getting your injection every 4 weeks. If you develop diarrhea, take Imodium after every watery bowel movement. Dr. Delton Coombes will see you back in 2 months for labs and follow up.   Thank you for choosing [place] at [facility] to provide your oncology and hematology care.  To afford each patient quality time with our provider, please arrive at least 15 minutes before your scheduled appointment time.   If you have a lab appointment with the [facility] please come in thru the Main Entrance and check in at the main information desk  You need to re-schedule your appointment should you arrive 10 or more minutes late.  We strive to give you quality time with our providers, and arriving late affects you and other patients whose appointments are after yours.  Also, if you no show three or more times for appointments you may be dismissed from the clinic at the providers discretion.     Again, thank you for choosing [facility].  Our hope is that these requests will decrease the amount of time that you wait before being seen by our physicians.       _____________________________________________________________  Should you have questions after your visit to [facility], please contact our office at [phone] between the hours of 8:00 a.m. and 4:30 p.m.  Voicemails left after 4:00 p.m. will not be returned until the following business day.  For prescription refill requests, have your pharmacy contact our office and allow 72 hours.    Cancer Center Support Programs:   > Cancer Support Group  2nd Tuesday of the month 1pm-2pm, Journey Room

## 2020-11-26 NOTE — Progress Notes (Signed)
Patient was assessed by Dr. Katragadda and labs have been reviewed.  Patient is okay to proceed with treatment today. Primary RN and pharmacy aware.   

## 2020-11-26 NOTE — Progress Notes (Signed)
[place], [place] [address]   CLINIC:  Medical Oncology/Hematology  PCP:  [name], MD [address] [place] [address] [phone]   REASON FOR VISIT:  Follow-up for multiple myeloma  PRIOR THERAPY:  1. RVD x 6 cycles from 03/01/2017 to 06/29/2017. 2. Stem cell transplant on 09/30/2017. 3. Carfilzomib and cyclophosphamide x 2 cycles from 02/14/2018 to 03/29/2018. 4. Carfilzomib x 22 cycles from 04/20/2018 to 01/23/2020.  NGS Results: Not done  CURRENT THERAPY: Darzalex Faspro every month; Pomalyst 3 weeks on, 1 week off  BRIEF ONCOLOGIC HISTORY:  Oncology History  Breast cancer, male ([place])  12/31/2009 Initial Biopsy   Biopsy of L breast    12/31/2009 Pathology Results   Invasive ductal carcinoma, ER/PR+, HER 2 negative   12/31/2009 Imaging   Ultrasound showing a 2.43 x 1.85 x 3 cm hypoechoic spiculated mass in the 12 o clock L breast retroareolar region   01/01/2010 -  Anti-estrogen oral therapy   Tamoxifen 20 mg daily   01/06/2010 Imaging   Bone scan abnormal uptake in the diaphysis of the R humerus, abnormal in the R third, fifth and sixth ribs, lesion also noted in the sternum.   02/03/2010 Surgery   Rod placement and fixation of R humerus by Dr. [name]   02/05/2010 - 02/18/2010 Radiation Therapy   30Gy in 10 fractions of 3 Gy per fraction to R pathologic fracture   03/11/2010 -  Chemotherapy   Denosumab monthly, now every 3 months. Started at [facility]    06/09/2016 Imaging   Three hypermetabolic osseous lesions in the sternum, left ilium and right ilium, as discussed above, likely represent osseous metastases. At this time, these are not recognizable on the CT images. 2. No extra skeletal metastatic disease identified in the neck, chest, abdomen or pelvis.   10/13/2016 Progression   PET shows various new and enlarging osseous metastatic lesions with no definite extra osseous metastatic disease currently identified.      12/31/2016 Progression   1. Multifocal hypermetabolic osseous metastases throughout the axial and proximal appendicular skeleton, which are increased in size, number and metabolism since 10/13/2016 PET-CT. 2. 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 3. No additional sites of hypermetabolic metastatic disease. 4. Chronic right mastoid sinusitis. 5. Aortic atherosclerosis.  One vessel coronary atherosclerosis.   Multiple myeloma ([place])  02/12/2017 Bone Marrow Biopsy   The marrow was variably cellular with large peritrabecular aggregates of kappa restricted plasma cells (66% by aspirate, 30% by Cd138). Cytogenetics +11.    03/01/2017 - 06/29/2017 Chemotherapy   RVD    05/26/2017 Bone Marrow Biopsy   Performed at [facility]:  Plasma cell myeloma in a 30% cellularmarrow with decreased trilineage hematopoiesis and 42% kappalight chain restricted plasma cells on the aspirate smears andlarge aggregates on the core biopsy.    07/12/2017 - 09/01/2017 Chemotherapy   3 cycles of carfizolmib/cyclophosphamide/dexamethasone     10/08/2017 Bone Marrow Transplant   [facility]otransplant at [facility]   02/28/2020 -  Chemotherapy   The patient had dexamethasone (DECADRON) tablet 20 mg, 20 mg, Oral,  Once, 2 of 2 cycles dexamethasone (DECADRON) tablet 20 mg, 20 mg, Oral, Once, 16 of 20 cycles Administration: 20 mg (03/25/2020), 20 mg (08/05/2020) daratumumab-hyaluronidase-fihj (DARZALEX FASPRO) 1800-30000 MG-UT/15ML chemo SQ injection 1,800 mg, 1,800 mg, Subcutaneous,  Once, 16 of 20 cycles Administration: 1,800  mg (02/28/2020), 1,800 mg (03/05/2020), 1,800 mg (03/12/2020), 1,800 mg (03/18/2020), 1,800 mg (03/25/2020), 1,800 mg (04/01/2020), 1,800 mg (04/29/2020), 1,800 mg (04/08/2020), 1,800 mg (04/15/2020), 1,800 mg (04/22/2020), 1,800 mg (05/28/2020), 1,800 mg (06/11/2020), 1,800 mg  (06/24/2020), 1,800 mg (07/08/2020), 1,800 mg (07/22/2020), 1,800 mg (08/05/2020), 1,800 mg (08/19/2020), 1,800 mg (09/03/2020), 1,800 mg (09/17/2020), 1,800 mg (10/01/2020), 1,800 mg (10/15/2020), 1,800 mg (10/29/2020) bortezomib SQ (VELCADE) chemo injection 2.75 mg, 1.3 mg/m2 = 2.75 mg, Subcutaneous,  Once, 4 of 4 cycles Administration: 2.75 mg (02/28/2020), 2.75 mg (03/05/2020), 2.75 mg (03/08/2020), 2.75 mg (03/18/2020), 2.75 mg (03/21/2020), 2.75 mg (03/25/2020), 2.75 mg (03/28/2020), 2.75 mg (04/29/2020), 2.75 mg (05/02/2020), 2.75 mg (05/06/2020), 2.75 mg (05/09/2020), 2.75 mg (04/08/2020), 2.75 mg (04/11/2020), 2.75 mg (04/15/2020), 2.75 mg (04/18/2020)  for chemotherapy treatment.    Multiple myeloma not having achieved remission ([place])  06/29/2017 Initial Diagnosis   Multiple myeloma not having achieved remission ([place])   02/28/2020 -  Chemotherapy   The patient had dexamethasone (DECADRON) tablet 20 mg, 20 mg, Oral,  Once, 2 of 2 cycles dexamethasone (DECADRON) tablet 20 mg, 20 mg, Oral, Once, 16 of 20 cycles Administration: 20 mg (03/25/2020), 20 mg (08/05/2020) daratumumab-hyaluronidase-fihj (DARZALEX FASPRO) 1800-30000 MG-UT/15ML chemo SQ injection 1,800 mg, 1,800 mg, Subcutaneous,  Once, 16 of 20 cycles Administration: 1,800 mg (02/28/2020), 1,800 mg (03/05/2020), 1,800 mg (03/12/2020), 1,800 mg (03/18/2020), 1,800 mg (03/25/2020), 1,800 mg (04/01/2020), 1,800 mg (04/29/2020), 1,800 mg (04/08/2020), 1,800 mg (04/15/2020), 1,800 mg (04/22/2020), 1,800 mg (05/28/2020), 1,800 mg (06/11/2020), 1,800 mg (06/24/2020), 1,800 mg (07/08/2020), 1,800 mg (07/22/2020), 1,800 mg (08/05/2020), 1,800 mg (08/19/2020), 1,800 mg (09/03/2020), 1,800 mg (09/17/2020), 1,800 mg (10/01/2020), 1,800 mg (10/15/2020), 1,800 mg (10/29/2020) bortezomib SQ (VELCADE) chemo injection 2.75 mg, 1.3 mg/m2 = 2.75 mg, Subcutaneous,  Once, 4 of 4 cycles Administration: 2.75 mg (02/28/2020), 2.75 mg (03/05/2020), 2.75 mg (03/08/2020), 2.75 mg (03/18/2020), 2.75 mg (03/21/2020), 2.75 mg  (03/25/2020), 2.75 mg (03/28/2020), 2.75 mg (04/29/2020), 2.75 mg (05/02/2020), 2.75 mg (05/06/2020), 2.75 mg (05/09/2020), 2.75 mg (04/08/2020), 2.75 mg (04/11/2020), 2.75 mg (04/15/2020), 2.75 mg (04/18/2020)  for chemotherapy treatment.      CANCER STAGING: Cancer Staging Multiple myeloma ([place]) Staging form: Plasma Cell Myeloma and Plasma Cell Disorders, AJCC 8th Edition - Clinical stage from 05/03/2017: Beta-2-microglobulin (mg/L): 3.5, Albumin (g/dL): 3.4, ISS: Stage II, High-risk cytogenetics: Absent, LDH: Not assessed - Signed by Baird Cancer, PA-C on 05/03/2017   INTERVAL HISTORY:  Johnny Lucas, a 74 y.o. male, returns for routine follow-up and consideration for next cycle of chemotherapy. Johnny Lucas was last seen on 10/29/2020.  Due for cycle #17 of Darzalex Faspro today.   Overall, he tells me he has been feeling pretty well. He denies having any extreme fatigue, dyspnea, CP or lightheadedness. He reports having intermittent diarrhea which is decreasing in frequency. He reports having an infection in his gums and was prescribed amoxicillin by his dentist. He is tolerating Pomalyst and Darzalex Faspro well.  Overall, he feels ready for next cycle of chemo today.    REVIEW OF SYSTEMS:  Review of Systems  Constitutional: Positive for appetite change (75%) and fatigue (75%).  Respiratory: Negative for shortness of breath.   Cardiovascular: Negative for chest pain.  Gastrointestinal: Positive for diarrhea (intermittent).  Musculoskeletal: Positive for myalgias (3/10 generalized pain).  Neurological: Negative for light-headedness.  Psychiatric/Behavioral: The patient is nervous/anxious.   All other systems reviewed and are negative.   PAST MEDICAL/SURGICAL HISTORY:  Past Medical History:  Diagnosis Date   Anxiety  Bone metastases ([place]) 09/10/2016   Breast cancer ([place]) 2011   Stage IV breast cancer; radiation and tamoxifen   Breast cancer, male ([place])    Stage IV breast cancer;  radiation and tamoxifen  Overview:  Left breast ca with mets bone  Overview:  METS TO BONE   GERD (gastroesophageal reflux disease)    Hypertension    Macular degeneration    Multiple myeloma ([place]) 02/18/2017   Peripheral neuropathy    Past Surgical History:  Procedure Laterality Date   BACK SURGERY     CATARACT EXTRACTION W/PHACO Left 02/03/2019   Procedure: CATARACT EXTRACTION PHACO AND INTRAOCULAR LENS PLACEMENT ([place]);  Surgeon: Baruch Goldmann, MD;  Location: [facility];  Service: Ophthalmology;  Laterality: Left;  CDE: 15.89   HERNIA REPAIR     PORTACATH PLACEMENT Left 06/10/2018   Procedure: INSERTION PORT-A-CATH;  Surgeon: Virl Cagey, MD;  Location: [facility];  Service: General;  Laterality: Left;   right arm surgery      SOCIAL HISTORY:  Social History   Socioeconomic History   Marital status: Married    Spouse name: Not on file   Number of children: 3   Years of education: Not on file   Highest education level: Not on file  Occupational History   Not on file  Tobacco Use   Smoking status: Never Smoker   Smokeless tobacco: Former Systems developer  Substance and Sexual Activity   Alcohol use: Yes    Alcohol/week: 24.0 standard drinks    Types: 24 Cans of beer per week   Drug use: No   Sexual activity: Not on file  Other Topics Concern   Not on file  Social History Narrative   Not on file   Social Determinants of Health   Financial Resource Strain: Low Risk    Difficulty of Paying Living Expenses: Not hard at all  Food Insecurity: No Food Insecurity   Worried About Charity fundraiser in the Last Year: Never true   [place] in the Last Year: Never true  Transportation Needs: No Transportation Needs   Lack of Transportation (Medical): No   Lack of Transportation (Non-Medical): No  Physical Activity: Inactive   Days of Exercise per Week: 0 days   Minutes of Exercise per Session: 0 min  Stress: Stress Concern Present   Feeling of Stress  : To some extent  Social Connections: Moderately Integrated   Frequency of Communication with Friends and Family: More than three times a week   Frequency of Social Gatherings with Friends and Family: Three times a week   Attends Religious Services: 1 to 4 times per year   Active Member of Clubs or Organizations: No   Attends Archivist Meetings: Never   Marital Status: Married  Human resources officer Violence: Not At Risk   Fear of Current or Ex-Partner: No   Emotionally Abused: No   Physically Abused: No   Sexually Abused: No    FAMILY HISTORY:  Family History  Problem Relation Age of Onset   Stroke Mother    Cancer Maternal Aunt        cancer NOS; died in her 68s   Lung cancer Maternal Uncle        smoker    CURRENT MEDICATIONS:  Current Outpatient Medications  Medication Sig Dispense Refill   acetaminophen (TYLENOL) 500 MG tablet Take 500 mg by mouth every 6 (six) hours as needed.      acyclovir (ZOVIRAX) 800 MG tablet  TAKE 1 TABLET TWICE DAILY 180 tablet 3   ALPRAZolam (XANAX) 0.5 MG tablet TAKE 1 TABLET BY MOUTH AT BEDTIME AS NEEDED FOR ANXIETY OR SLEEP 30 tablet 3   aspirin EC 81 MG tablet Take 81 mg by mouth daily.      augmented betamethasone dipropionate (DIPROLENE-AF) 0.05 % cream Apply topically as needed. 30 g 6   calcium-vitamin D (OSCAL WITH D) 500-200 MG-UNIT TABS tablet TAKE TWO TABLETS BY MOUTH DAILY 60 tablet 6   Cholecalciferol (VITAMIN D) 50 MCG (2000 UT) CAPS Take 4,000 Units by mouth 2 (two) times daily.      DARATUMUMAB-HYALURONIDASE-FIHJ [place] Inject 1,800 mg into the skin every 14 (fourteen) days.      denosumab (XGEVA) 120 MG/1.7ML SOLN injection Inject 120 mg into the skin every 30 (thirty) days.      dexamethasone (DECADRON) 4 MG tablet Take 5 pills before each treatment (Patient taking differently: Take 5 pills weekly.) 40 tablet 5   diclofenac Sodium (VOLTAREN) 1 % GEL Use 1 to 2 times per day as directed by doctor      folic acid (FOLVITE) 1 MG tablet TAKE 1 TABLET BY MOUTH DAILY 90 tablet 2   gabapentin (NEURONTIN) 300 MG capsule Take 1 capsule (300 mg total) by mouth 3 (three) times daily. 180 capsule 2   hydrALAZINE (APRESOLINE) 50 MG tablet Take 1 tablet (50 mg total) by mouth every 8 (eight) hours. 90 tablet 0   loratadine (CLARITIN) 10 MG tablet Take 1 tablet (10 mg total) by mouth daily. 90 tablet 1   metoprolol succinate (TOPROL-XL) 100 MG 24 hr tablet Take 100 mg by mouth daily.      Multiple Vitamin (MULTIVITAMIN WITH MINERALS) TABS tablet Take 1 tablet by mouth daily.      ondansetron (ZOFRAN) 8 MG tablet TAKE 1 TABLET BY MOUTH EVERY 8 HOURS AS NEEDED FOR NAUSEA AND VOMITING 30 tablet 2   pantoprazole (PROTONIX) 40 MG tablet Take 40 mg by mouth every other day.      polyethylene glycol (MIRALAX / GLYCOLAX) packet Take 17 g by mouth daily as needed for mild constipation. 14 each 0   POMALYST 2 MG capsule TAKE 1 CAPSULE BY MOUTH EVERY DAY 21 capsule 0   prochlorperazine (COMPAZINE) 10 MG tablet Take 1 tablet (10 mg total) by mouth every 6 (six) hours as needed for nausea or vomiting. 30 tablet 2   pseudoephedrine-guaifenesin (MUCINEX D) 60-600 MG 12 hr tablet Take 1 tablet by mouth 2 (two) times daily as needed for congestion.      tamoxifen (NOLVADEX) 20 MG tablet Take 1 tablet (20 mg total) by mouth daily. 90 tablet 3   vitamin B-12 (CYANOCOBALAMIN) 1000 MCG tablet Take 1,000 mcg by mouth daily.      No current facility-administered medications for this visit.   Facility-Administered Medications Ordered in Other Visits  Medication Dose Route Frequency Provider Last Rate Last Admin   ondansetron (ZOFRAN) 8 mg in sodium chloride 0.9 % 50 mL IVPB  8 mg Intravenous Once Lockamy, Randi L, NP-C       sodium chloride flush (NS) 0.9 % injection 10 mL  10 mL Intracatheter Once PRN Derek Jack, MD        ALLERGIES:  Allergies  Allergen Reactions   Cefepime     Suspected severe  thrombocytopenia is a result of cefepime induced antigen platelet destruction    PHYSICAL EXAM:  Performance status (ECOG): 1 - Symptomatic but completely ambulatory  Vitals:  11/26/20 1321  BP: (!) 153/81  Pulse: (!) 53  Resp: 18  Temp: (!) 97 F (36.1 C)  SpO2: 96%   Wt Readings from Last 3 Encounters:  11/26/20 208 lb 4.8 oz (94.5 kg)  10/29/20 207 lb 1.6 oz (93.9 kg)  10/15/20 204 lb 12.8 oz (92.9 kg)   Physical Exam Vitals reviewed.  Constitutional:      Appearance: Normal appearance. He is obese.  Cardiovascular:     Rate and Rhythm: Normal rate and regular rhythm.     Pulses: Normal pulses.     Heart sounds: Normal heart sounds.  Pulmonary:     Effort: Pulmonary effort is normal.     Breath sounds: Normal breath sounds.  Neurological:     General: No focal deficit present.     Mental Status: He is alert and oriented to person, place, and time.  Psychiatric:        Mood and Affect: Mood normal.        Behavior: Behavior normal.     LABORATORY DATA:  I have reviewed the labs as listed.  CBC Latest Ref Rng & Units 10/29/2020 10/15/2020 10/01/2020  WBC 4.0 - 10.5 K/uL 3.3(L) 3.0(L) 3.8(L)  Hemoglobin 13.0 - 17.0 g/dL 10.9(L) 11.1(L) 9.9(L)  Hematocrit 39 - 52 % 33.3(L) 33.7(L) 30.2(L)  Platelets 150 - 400 K/uL 168 188 150   CMP Latest Ref Rng & Units 11/26/2020 10/29/2020 10/15/2020  Glucose 70 - 99 mg/dL 101(H) 122(H) 116(H)  BUN 8 - 23 mg/dL 11 11 16   Creatinine 0.61 - 1.24 mg/dL 1.23 1.44(H) 1.57(H)  Sodium 135 - 145 mmol/L 136 137 139  Potassium 3.5 - 5.1 mmol/L 4.1 4.1 4.3  Chloride 98 - 111 mmol/L 107 106 104  CO2 22 - 32 mmol/L 22 25 26   Calcium 8.9 - 10.3 mg/dL 8.0(L) 8.6(L) 9.0  Total Protein 6.5 - 8.1 g/dL 5.5(L) 5.3(L) 5.6(L)  Total Bilirubin 0.3 - 1.2 mg/dL 0.6 0.4 0.6  Alkaline Phos 38 - 126 U/L 26(L) 26(L) 27(L)  AST 15 - 41 U/L 13(L) 12(L) 13(L)  ALT 0 - 44 U/L 14 13 13    Lab Results  Component Value Date   TOTALPROTELP 4.9 (L)  10/29/2020   ALBUMINELP 3.0 10/29/2020   A1GS 0.3 10/29/2020   A2GS 0.6 10/29/2020   BETS 0.8 10/29/2020   GAMS 0.2 (L) 10/29/2020   MSPIKE 0.1 (H) 10/29/2020   SPEI Comment 10/29/2020    Lab Results  Component Value Date   KPAFRELGTCHN 4.4 10/29/2020   LAMBDASER 1.7 (L) 10/29/2020   KAPLAMBRATIO 2.59 (H) 10/29/2020    DIAGNOSTIC IMAGING:  I have independently reviewed the scans and discussed with the patient. No results found.   ASSESSMENT:  1. Relapsed multiple myeloma: -Autologous stem cell transplant on 10/08/2017 -Post transplant consolidation withCyCarD for 2 cycles with M spike undetectable. -Maintenance therapy with carfilzomib 70 mg per metered square days 1 and 15 every 28 days from 04/20/2018, dose reduced to 35 mg per metered square on 01/05/2019, titrated up to 56 mg per metered square on 01/09/2020. 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 -PET scan on 02/18/2020 showed new left supraclavicular lymph node at the thoracic inlet, SUV 14.2. Multifocal osseous lesions largely improved, though residual lesions noted including right acromion, left acromion, thyroid cartilage, right sternum. Additional lesions throughout the sternum, bilateral ribs, thoracolumbar spine, bilateral pelvis have resolved. -BMBX on 02/14/2020 shows plasma cell myeloma, 60-70% of cells. FISH for myeloma was  negative. Cytogenetics was normal. -4 cycles of daratumumab, bortezomib and dexamethasone from 02/28/2020 through 04/29/2020. -Daratumumab, pomalidomide and dexamethasone started on 05/28/2020. He is receiving daratumumab every 2 weeks, Pomalyst 3 mg days 1-21, dexamethasone 20 mg weekly. -Pomalidomide dose reduced to 2 mg days 1-21 around the first week of July 2021, due to severe weakness.   PLAN:  1. Relapsed IgG kappa multiple myeloma: -He is tolerating pomalidomide 2 mg 3 weeks on 1 week off very well. -He is continuing dexamethasone 20 mg  weekly. -He has on and off diarrhea which is controlled with Imodium. -Reviewed myeloma labs from 10/29/2020 with M spike 0.1 g.  Kappa/lambda light chain ratio has improved to 2.59 from 4.56. -He is continuing to be in VGPR. -Continue monthly Darzalex.  RTC 2 months with repeat myeloma labs 1 week prior.  2. Myeloma bone disease: -Continue denosumab monthly.  No side effects noted.  3. Macrocytic anemia: -Hemoglobin today is 11.2.  Consider growth factor only if hemoglobin drops below 9.  4. ID prophylaxis: -Continue acyclovir twice daily.  5. Peripheral neuropathy: -Continue gabapentin 300 mg 3 times daily.  6. History of breast cancer: -Continue tamoxifen.   Orders placed this encounter:  No orders of the defined types were placed in this encounter.    Derek Jack, MD [place] [phone]   I, Milinda Antis, am acting as a scribe for Dr. Sanda Linger.  I, Derek Jack MD, have reviewed the above documentation for accuracy and completeness, and I agree with the above.

## 2020-11-27 ENCOUNTER — Inpatient Hospital Stay (HOSPITAL_COMMUNITY): Payer: Medicare Other | Attending: Hematology

## 2020-11-27 VITALS — BP 150/72 | HR 60 | Temp 97.7°F | Resp 18

## 2020-11-27 DIAGNOSIS — C9000 Multiple myeloma not having achieved remission: Secondary | ICD-10-CM | POA: Insufficient documentation

## 2020-11-27 DIAGNOSIS — N179 Acute kidney failure, unspecified: Secondary | ICD-10-CM

## 2020-11-27 DIAGNOSIS — C7951 Secondary malignant neoplasm of bone: Secondary | ICD-10-CM

## 2020-11-27 DIAGNOSIS — Z79899 Other long term (current) drug therapy: Secondary | ICD-10-CM | POA: Diagnosis not present

## 2020-11-27 LAB — PROTEIN ELECTROPHORESIS, SERUM
A/G Ratio: 1.8 — ABNORMAL HIGH (ref 0.7–1.7)
Albumin ELP: 3.3 g/dL (ref 2.9–4.4)
Alpha-1-Globulin: 0.3 g/dL (ref 0.0–0.4)
Alpha-2-Globulin: 0.6 g/dL (ref 0.4–1.0)
Beta Globulin: 0.8 g/dL (ref 0.7–1.3)
Gamma Globulin: 0.2 g/dL — ABNORMAL LOW (ref 0.4–1.8)
Globulin, Total: 1.8 g/dL — ABNORMAL LOW (ref 2.2–3.9)
M-Spike, %: 0.1 g/dL — ABNORMAL HIGH
Total Protein ELP: 5.1 g/dL — ABNORMAL LOW (ref 6.0–8.5)

## 2020-11-27 LAB — KAPPA/LAMBDA LIGHT CHAINS
Kappa free light chain: 1.7 mg/L — ABNORMAL LOW (ref 3.3–19.4)
Kappa, lambda light chain ratio: 0.81 (ref 0.26–1.65)
Lambda free light chains: 2.1 mg/L — ABNORMAL LOW (ref 5.7–26.3)

## 2020-11-27 MED ORDER — DENOSUMAB 120 MG/1.7ML ~~LOC~~ SOLN
120.0000 mg | Freq: Once | SUBCUTANEOUS | Status: AC
  Administered 2020-11-27: 120 mg via SUBCUTANEOUS
  Filled 2020-11-27: qty 1.7

## 2020-11-27 MED ORDER — EPOETIN ALFA-EPBX 10000 UNIT/ML IJ SOLN: 20000.0000 [IU] | Freq: Once | INTRAMUSCULAR | Status: DC

## 2020-11-27 NOTE — Progress Notes (Signed)
Patient's calcium 8.32 today.  Ok to give Juliaetta per Dr. Delton Coombes.

## 2020-11-27 NOTE — Progress Notes (Signed)
Patient tolerated Xgeva injection with no complaints voiced.  Site clean and dry with no bruising or swelling noted.  No complaints of pain.  Discharged with vital signs stable and no signs or symptoms of distress noted.  

## 2020-11-28 ENCOUNTER — Other Ambulatory Visit (HOSPITAL_COMMUNITY): Payer: Self-pay | Admitting: *Deleted

## 2020-11-28 DIAGNOSIS — C9000 Multiple myeloma not having achieved remission: Secondary | ICD-10-CM

## 2020-12-15 ENCOUNTER — Other Ambulatory Visit (HOSPITAL_COMMUNITY): Payer: Self-pay | Admitting: Hematology

## 2020-12-15 DIAGNOSIS — I10 Essential (primary) hypertension: Secondary | ICD-10-CM

## 2020-12-15 DIAGNOSIS — G4700 Insomnia, unspecified: Secondary | ICD-10-CM

## 2020-12-17 DIAGNOSIS — I129 Hypertensive chronic kidney disease with stage 1 through stage 4 chronic kidney disease, or unspecified chronic kidney disease: Secondary | ICD-10-CM | POA: Diagnosis not present

## 2020-12-17 DIAGNOSIS — Z9221 Personal history of antineoplastic chemotherapy: Secondary | ICD-10-CM | POA: Diagnosis not present

## 2020-12-17 DIAGNOSIS — N183 Chronic kidney disease, stage 3 unspecified: Secondary | ICD-10-CM | POA: Diagnosis not present

## 2020-12-17 DIAGNOSIS — G629 Polyneuropathy, unspecified: Secondary | ICD-10-CM | POA: Diagnosis not present

## 2020-12-17 DIAGNOSIS — Z853 Personal history of malignant neoplasm of breast: Secondary | ICD-10-CM | POA: Diagnosis not present

## 2020-12-17 DIAGNOSIS — G620 Drug-induced polyneuropathy: Secondary | ICD-10-CM | POA: Diagnosis not present

## 2020-12-17 DIAGNOSIS — Z923 Personal history of irradiation: Secondary | ICD-10-CM | POA: Diagnosis not present

## 2020-12-17 DIAGNOSIS — Z9484 Stem cells transplant status: Secondary | ICD-10-CM | POA: Diagnosis not present

## 2020-12-17 DIAGNOSIS — Z79899 Other long term (current) drug therapy: Secondary | ICD-10-CM | POA: Diagnosis not present

## 2020-12-17 DIAGNOSIS — D539 Nutritional anemia, unspecified: Secondary | ICD-10-CM | POA: Diagnosis not present

## 2020-12-17 DIAGNOSIS — T451X5A Adverse effect of antineoplastic and immunosuppressive drugs, initial encounter: Secondary | ICD-10-CM | POA: Diagnosis not present

## 2020-12-17 DIAGNOSIS — C9000 Multiple myeloma not having achieved remission: Secondary | ICD-10-CM | POA: Diagnosis not present

## 2020-12-19 ENCOUNTER — Other Ambulatory Visit (HOSPITAL_COMMUNITY): Payer: Self-pay

## 2020-12-19 DIAGNOSIS — G4700 Insomnia, unspecified: Secondary | ICD-10-CM

## 2020-12-19 DIAGNOSIS — I10 Essential (primary) hypertension: Secondary | ICD-10-CM

## 2020-12-19 MED ORDER — POMALIDOMIDE 2 MG PO CAPS: 2.0000 mg | ORAL_CAPSULE | Freq: Every day | ORAL | 0 refills | Status: DC

## 2020-12-19 NOTE — Telephone Encounter (Signed)
Chart reviewed. Pomalyst refilled per Dr. Delton Coombes

## 2020-12-24 ENCOUNTER — Inpatient Hospital Stay (HOSPITAL_COMMUNITY): Payer: Medicare Other

## 2020-12-24 ENCOUNTER — Other Ambulatory Visit: Payer: Self-pay

## 2020-12-24 VITALS — BP 120/78 | HR 71 | Temp 97.0°F | Resp 18 | Wt 208.6 lb

## 2020-12-24 DIAGNOSIS — C9000 Multiple myeloma not having achieved remission: Secondary | ICD-10-CM

## 2020-12-24 DIAGNOSIS — Z79899 Other long term (current) drug therapy: Secondary | ICD-10-CM | POA: Diagnosis not present

## 2020-12-24 LAB — COMPREHENSIVE METABOLIC PANEL
ALT: 13 U/L (ref 0–44)
AST: 14 U/L — ABNORMAL LOW (ref 15–41)
Albumin: 3.4 g/dL — ABNORMAL LOW (ref 3.5–5.0)
Alkaline Phosphatase: 25 U/L — ABNORMAL LOW (ref 38–126)
Anion gap: 7 (ref 5–15)
BUN: 15 mg/dL (ref 8–23)
CO2: 21 mmol/L — ABNORMAL LOW (ref 22–32)
Calcium: 7.7 mg/dL — ABNORMAL LOW (ref 8.9–10.3)
Chloride: 108 mmol/L (ref 98–111)
Creatinine, Ser: 1.38 mg/dL — ABNORMAL HIGH (ref 0.61–1.24)
GFR, Estimated: 54 mL/min — ABNORMAL LOW (ref >60)
Glucose, Bld: 120 mg/dL — ABNORMAL HIGH (ref 70–99)
Potassium: 4.4 mmol/L (ref 3.5–5.1)
Sodium: 136 mmol/L (ref 135–145)
Total Bilirubin: 0.4 mg/dL (ref 0.3–1.2)
Total Protein: 5.4 g/dL — ABNORMAL LOW (ref 6.5–8.1)

## 2020-12-24 LAB — CBC WITH DIFFERENTIAL/PLATELET
Abs Immature Granulocytes: 0.01 10*3/uL (ref 0.00–0.07)
Basophils Absolute: 0.1 10*3/uL (ref 0.0–0.1)
Basophils Relative: 2 %
Eosinophils Absolute: 0.2 10*3/uL (ref 0.0–0.5)
Eosinophils Relative: 10 %
HCT: 33.7 % — ABNORMAL LOW (ref 39.0–52.0)
Hemoglobin: 11 g/dL — ABNORMAL LOW (ref 13.0–17.0)
Immature Granulocytes: 1 %
Lymphocytes Relative: 16 %
Lymphs Abs: 0.3 10*3/uL — ABNORMAL LOW (ref 0.7–4.0)
MCH: 35.3 pg — ABNORMAL HIGH (ref 26.0–34.0)
MCHC: 32.6 g/dL (ref 30.0–36.0)
MCV: 108 fL — ABNORMAL HIGH (ref 80.0–100.0)
Monocytes Absolute: 0.4 10*3/uL (ref 0.1–1.0)
Monocytes Relative: 16 %
Neutro Abs: 1.2 10*3/uL — ABNORMAL LOW (ref 1.7–7.7)
Neutrophils Relative %: 55 %
Platelets: 156 10*3/uL (ref 150–400)
RBC: 3.12 MIL/uL — ABNORMAL LOW (ref 4.22–5.81)
RDW: 15.6 % — ABNORMAL HIGH (ref 11.5–15.5)
WBC: 2.2 10*3/uL — ABNORMAL LOW (ref 4.0–10.5)
nRBC: 0 % (ref 0.0–0.2)

## 2020-12-24 LAB — MAGNESIUM: Magnesium: 1.9 mg/dL (ref 1.7–2.4)

## 2020-12-24 MED ORDER — DEXAMETHASONE 4 MG PO TABS: 20.0000 mg | ORAL_TABLET | Freq: Once | ORAL | Status: DC

## 2020-12-24 MED ORDER — DARATUMUMAB-HYALURONIDASE-FIHJ 1800-30000 MG-UT/15ML ~~LOC~~ SOLN
1800.0000 mg | Freq: Once | SUBCUTANEOUS | Status: AC
  Administered 2020-12-24: 16:00:00 1800 mg via SUBCUTANEOUS
  Filled 2020-12-24: qty 15

## 2020-12-24 MED ORDER — HEPARIN SOD (PORK) LOCK FLUSH 100 UNIT/ML IV SOLN
500.0000 [IU] | Freq: Once | INTRAVENOUS | Status: AC
  Administered 2020-12-24: 16:00:00 500 [IU] via INTRAVENOUS

## 2020-12-24 MED ORDER — DIPHENHYDRAMINE HCL 25 MG PO CAPS: 50.0000 mg | ORAL_CAPSULE | Freq: Once | ORAL | Status: DC

## 2020-12-24 MED ORDER — ACETAMINOPHEN 325 MG PO TABS: 650.0000 mg | ORAL_TABLET | Freq: Once | ORAL | Status: DC

## 2020-12-24 MED ORDER — SODIUM CHLORIDE 0.9% FLUSH
20.0000 mL | INTRAVENOUS | Status: DC | PRN
  Administered 2020-12-24: 16:00:00 20 mL via INTRAVENOUS

## 2020-12-24 NOTE — Progress Notes (Signed)
Johnny Lucas tolerated Darzalex injection well without complaints or incident. VSS Pt discharged self ambulatory in satisfactory condition

## 2020-12-24 NOTE — Progress Notes (Signed)
Johnny Lucas presents today for D1C31 Daratumumab SQ. Pt denies any new changes or symptoms since last treatment. Lab results and vitals have been reviewed with Dr. Delton Coombes, including ANC 1.2, and are stable and within parameters for treatment. Per Dr. Delton Coombes ok to proceed with treatment today as planned.  Pt also scheduled to receive Xgeva tomorrow. Ca today is 7.7, corrected Ca 8.18. Per Dr. Delton Coombes, ok to proceed with [facility] tomorrow as planned.

## 2020-12-24 NOTE — Patient Instructions (Signed)
[facility] Discharge Instructions for Patients Receiving Chemotherapy   Beginning January 23rd 2017 lab work for the Cancer Center will be done in the  Main lab at [place] on 1st floor. If you have a lab appointment with the Cancer Center please come in thru the  Main Entrance and check in at the main information desk   Today you received the following chemotherapy agents Daratumumab  To help prevent nausea and vomiting after your treatment, we encourage you to take your nausea medication   If you develop nausea and vomiting, or diarrhea that is not controlled by your medication, call the clinic.  The clinic phone number is [phone]. Office hours are Monday-Friday 8:30am-5:00pm.  BELOW ARE SYMPTOMS THAT SHOULD BE REPORTED IMMEDIATELY:  *FEVER GREATER THAN 101.0 F  *CHILLS WITH OR WITHOUT FEVER  NAUSEA AND VOMITING THAT IS NOT CONTROLLED WITH YOUR NAUSEA MEDICATION  *UNUSUAL SHORTNESS OF BREATH  *UNUSUAL BRUISING OR BLEEDING  TENDERNESS IN MOUTH AND THROAT WITH OR WITHOUT PRESENCE OF ULCERS  *URINARY PROBLEMS  *BOWEL PROBLEMS  UNUSUAL RASH Items with * indicate a potential emergency and should be followed up as soon as possible. If you have an emergency after office hours please contact your primary care physician or go to the nearest emergency department.  Please call the clinic during office hours if you have any questions or concerns.   You may also contact the Patient Navigator at [phone] should you have any questions or need assistance in obtaining follow up care.      Resources For Cancer Patients and their Caregivers ? American Cancer Society: Can assist with transportation, wigs, general needs, runs Look Good Feel Better.        1-888-227-6333 ? Cancer Care: Provides financial assistance, online support groups, medication/co-pay assistance.  1-800-813-HOPE (4673) ? [facility] Assists [place]  cancer patients and their families through emotional , educational and financial support.  336-427-4357 ? [place] DSS Where to apply for food stamps, Medicaid and utility assistance. 336-342-1394 ? [facility]: Transportation to medical appointments. 336-347-2287 ? Social Security Administration: May apply for disability if have a Stage IV cancer. 336-342-7796 1-800-772-1213 ? [place] Aging, Disability and Transit Services: Assists with nutrition, care and transit needs. 336-349-2343          

## 2020-12-25 ENCOUNTER — Inpatient Hospital Stay (HOSPITAL_COMMUNITY): Payer: Medicare Other

## 2020-12-25 VITALS — BP 139/73 | HR 86 | Temp 97.0°F | Resp 18

## 2020-12-25 DIAGNOSIS — C7951 Secondary malignant neoplasm of bone: Secondary | ICD-10-CM

## 2020-12-25 DIAGNOSIS — C9000 Multiple myeloma not having achieved remission: Secondary | ICD-10-CM

## 2020-12-25 DIAGNOSIS — Z79899 Other long term (current) drug therapy: Secondary | ICD-10-CM | POA: Diagnosis not present

## 2020-12-25 MED ORDER — DENOSUMAB 120 MG/1.7ML ~~LOC~~ SOLN
120.0000 mg | Freq: Once | SUBCUTANEOUS | Status: AC
  Administered 2020-12-25: 12:00:00 120 mg via SUBCUTANEOUS
  Filled 2020-12-25: qty 1.7

## 2020-12-25 NOTE — Progress Notes (Signed)
Patient tolerated Xgeva injection with no complaints voiced.  Site clean and dry with no bruising or swelling noted.  No complaints of pain.  Discharged with vital signs stable and no signs or symptoms of distress noted.  

## 2021-01-02 DIAGNOSIS — Z23 Encounter for immunization: Secondary | ICD-10-CM | POA: Diagnosis not present

## 2021-01-09 ENCOUNTER — Other Ambulatory Visit (HOSPITAL_COMMUNITY): Payer: Self-pay | Admitting: Hematology

## 2021-01-09 DIAGNOSIS — G4700 Insomnia, unspecified: Secondary | ICD-10-CM

## 2021-01-09 DIAGNOSIS — I10 Essential (primary) hypertension: Secondary | ICD-10-CM

## 2021-01-14 ENCOUNTER — Other Ambulatory Visit: Payer: Self-pay

## 2021-01-14 ENCOUNTER — Inpatient Hospital Stay (HOSPITAL_COMMUNITY): Payer: Medicare Other | Attending: Hematology

## 2021-01-14 DIAGNOSIS — C9000 Multiple myeloma not having achieved remission: Secondary | ICD-10-CM | POA: Insufficient documentation

## 2021-01-14 DIAGNOSIS — Z5112 Encounter for antineoplastic immunotherapy: Secondary | ICD-10-CM | POA: Insufficient documentation

## 2021-01-14 LAB — COMPREHENSIVE METABOLIC PANEL
ALT: 16 U/L (ref 0–44)
AST: 16 U/L (ref 15–41)
Albumin: 3.7 g/dL (ref 3.5–5.0)
Alkaline Phosphatase: 30 U/L — ABNORMAL LOW (ref 38–126)
Anion gap: 11 (ref 5–15)
BUN: 21 mg/dL (ref 8–23)
CO2: 21 mmol/L — ABNORMAL LOW (ref 22–32)
Calcium: 8.5 mg/dL — ABNORMAL LOW (ref 8.9–10.3)
Chloride: 102 mmol/L (ref 98–111)
Creatinine, Ser: 1.57 mg/dL — ABNORMAL HIGH (ref 0.61–1.24)
GFR, Estimated: 47 mL/min — ABNORMAL LOW (ref >60)
Glucose, Bld: 122 mg/dL — ABNORMAL HIGH (ref 70–99)
Potassium: 4.4 mmol/L (ref 3.5–5.1)
Sodium: 134 mmol/L — ABNORMAL LOW (ref 135–145)
Total Bilirubin: 0.5 mg/dL (ref 0.3–1.2)
Total Protein: 6.2 g/dL — ABNORMAL LOW (ref 6.5–8.1)

## 2021-01-14 LAB — CBC WITH DIFFERENTIAL/PLATELET
Abs Immature Granulocytes: 0.07 10*3/uL (ref 0.00–0.07)
Basophils Absolute: 0 10*3/uL (ref 0.0–0.1)
Basophils Relative: 0 %
Eosinophils Absolute: 0 10*3/uL (ref 0.0–0.5)
Eosinophils Relative: 0 %
HCT: 37.1 % — ABNORMAL LOW (ref 39.0–52.0)
Hemoglobin: 12.1 g/dL — ABNORMAL LOW (ref 13.0–17.0)
Immature Granulocytes: 1 %
Lymphocytes Relative: 4 %
Lymphs Abs: 0.5 10*3/uL — ABNORMAL LOW (ref 0.7–4.0)
MCH: 34.6 pg — ABNORMAL HIGH (ref 26.0–34.0)
MCHC: 32.6 g/dL (ref 30.0–36.0)
MCV: 106 fL — ABNORMAL HIGH (ref 80.0–100.0)
Monocytes Absolute: 0.2 10*3/uL (ref 0.1–1.0)
Monocytes Relative: 2 %
Neutro Abs: 9.6 10*3/uL — ABNORMAL HIGH (ref 1.7–7.7)
Neutrophils Relative %: 93 %
Platelets: 237 10*3/uL (ref 150–400)
RBC: 3.5 MIL/uL — ABNORMAL LOW (ref 4.22–5.81)
RDW: 15.4 % (ref 11.5–15.5)
WBC: 10.3 10*3/uL (ref 4.0–10.5)
nRBC: 0 % (ref 0.0–0.2)

## 2021-01-14 LAB — MAGNESIUM: Magnesium: 1.8 mg/dL (ref 1.7–2.4)

## 2021-01-14 NOTE — Telephone Encounter (Signed)
Chart reviewed. Pomalyst refilled per Dr. Delton Coombes

## 2021-01-15 ENCOUNTER — Other Ambulatory Visit (HOSPITAL_COMMUNITY): Payer: Self-pay

## 2021-01-21 ENCOUNTER — Inpatient Hospital Stay (HOSPITAL_COMMUNITY): Payer: Medicare Other

## 2021-01-21 ENCOUNTER — Inpatient Hospital Stay (HOSPITAL_BASED_OUTPATIENT_CLINIC_OR_DEPARTMENT_OTHER): Payer: Medicare Other | Admitting: Hematology

## 2021-01-21 ENCOUNTER — Other Ambulatory Visit (HOSPITAL_COMMUNITY): Payer: Self-pay | Admitting: Hematology

## 2021-01-21 ENCOUNTER — Other Ambulatory Visit: Payer: Self-pay

## 2021-01-21 VITALS — BP 158/79 | HR 67 | Temp 97.2°F | Resp 18 | Wt 214.8 lb

## 2021-01-21 DIAGNOSIS — C9000 Multiple myeloma not having achieved remission: Secondary | ICD-10-CM

## 2021-01-21 DIAGNOSIS — Z5112 Encounter for antineoplastic immunotherapy: Secondary | ICD-10-CM | POA: Diagnosis not present

## 2021-01-21 MED ORDER — DEXAMETHASONE 4 MG PO TABS: 20.0000 mg | ORAL_TABLET | Freq: Once | ORAL | Status: DC

## 2021-01-21 MED ORDER — DIPHENHYDRAMINE HCL 25 MG PO CAPS: 50.0000 mg | ORAL_CAPSULE | Freq: Once | ORAL | Status: DC

## 2021-01-21 MED ORDER — ACETAMINOPHEN 325 MG PO TABS: 650.0000 mg | ORAL_TABLET | Freq: Once | ORAL | Status: DC

## 2021-01-21 MED ORDER — DARATUMUMAB-HYALURONIDASE-FIHJ 1800-30000 MG-UT/15ML ~~LOC~~ SOLN
1800.0000 mg | Freq: Once | SUBCUTANEOUS | Status: AC
  Administered 2021-01-21: 1800 mg via SUBCUTANEOUS
  Filled 2021-01-21: qty 15

## 2021-01-21 NOTE — Progress Notes (Signed)
[place],  [address]   CLINIC:  Medical Oncology/Hematology  PCP:  [name], MD [address] [place] [address] [phone]   REASON FOR VISIT:  Follow-up for multiple myeloma  PRIOR THERAPY:  1. RVD x 6 cycles from 03/01/2017 to 06/29/2017. 2. Stem cell transplant on 09/30/2017. 3. Carfilzomib and cyclophosphamide x 2 cycles from 02/14/2018 to 03/29/2018. 4. Carfilzomib x 22 cycles from 04/20/2018 to 01/23/2020.  NGS Results: Not done  CURRENT THERAPY: Darzalex Faspro every month; Pomalyst 2 mg 3 weeks on, 1 week off; Xgeva every month  BRIEF ONCOLOGIC HISTORY:  Oncology History  Breast cancer, male ([place])  12/31/2009 Initial Biopsy   Biopsy of L breast    12/31/2009 Pathology Results   Invasive ductal carcinoma, ER/PR+, HER 2 negative   12/31/2009 Imaging   Ultrasound showing a 2.43 x 1.85 x 3 cm hypoechoic spiculated mass in the 12 o clock L breast retroareolar region   01/01/2010 -  Anti-estrogen oral therapy   Tamoxifen 20 mg daily   01/06/2010 Imaging   Bone scan abnormal uptake in the diaphysis of the R humerus, abnormal in the R third, fifth and sixth ribs, lesion also noted in the sternum.   02/03/2010 Surgery   Rod placement and fixation of R humerus by Dr. [name]   02/05/2010 - 02/18/2010 Radiation Therapy   30Gy in 10 fractions of 3 Gy per fraction to R pathologic fracture   03/11/2010 -  Chemotherapy   Denosumab monthly, now every 3 months. Started at [facility]    06/09/2016 Imaging   Three hypermetabolic osseous lesions in the sternum, left ilium and right ilium, as discussed above, likely represent osseous metastases. At this time, these are not recognizable on the CT images. 2. No extra skeletal metastatic disease identified in the neck, chest, abdomen or pelvis.   10/13/2016 Progression   PET shows various new and enlarging osseous metastatic lesions with no definite extra osseous metastatic disease  currently identified.    12/31/2016 Progression   1. Multifocal hypermetabolic osseous metastases throughout the axial and proximal appendicular skeleton, which are increased in size, number and metabolism since 10/13/2016 PET-CT. 2. 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 3. No additional sites of hypermetabolic metastatic disease. 4. Chronic right mastoid sinusitis. 5. Aortic atherosclerosis.  One vessel coronary atherosclerosis.   Multiple myeloma ([place])  02/12/2017 Bone Marrow Biopsy   The marrow was variably cellular with large peritrabecular aggregates of kappa restricted plasma cells (66% by aspirate, 30% by Cd138). Cytogenetics +11.    03/01/2017 - 06/29/2017 Chemotherapy   RVD    05/26/2017 Bone Marrow Biopsy   Performed at [facility]:  Plasma cell myeloma in a 30% cellularmarrow with decreased trilineage hematopoiesis and 42% kappalight chain restricted plasma cells on the aspirate smears andlarge aggregates on the core biopsy.    07/12/2017 - 09/01/2017 Chemotherapy   3 cycles of carfizolmib/cyclophosphamide/dexamethasone     10/08/2017 Bone Marrow Transplant   [facility]otransplant at [facility]   02/28/2020 -  Chemotherapy    Patient is on Treatment Plan: DARATUMUMAB Q14D      Multiple myeloma not having achieved remission ([place])  06/29/2017 Initial Diagnosis   Multiple myeloma not having achieved remission ([place])   02/28/2020 -  Chemotherapy    Patient is on Treatment Plan: [place]  CANCER STAGING: Cancer Staging Multiple myeloma ([place]) Staging form: Plasma Cell Myeloma and Plasma Cell Disorders, AJCC 8th Edition - Clinical stage from 05/03/2017: Beta-2-microglobulin (mg/L): 3.5, Albumin (g/dL): 3.4, ISS: Stage II, High-risk cytogenetics: Absent, LDH: Not assessed - Signed by Baird Cancer, PA-C on  05/03/2017   INTERVAL HISTORY:  Johnny Lucas, a 75 y.o. male, returns for routine follow-up and consideration for next cycle of immunotherapy. Johnny [facility] was last seen on 11/26/2020.  Due for cycle #19 of Darzalex Faspro today.   Overall, he tells me he has been feeling pretty well. He tolerated the previous treatment well and is tolerating the Pomalyst well. He takes Decadron once a week. His energy levels have improved since cutting back on Pomalyst. His appetite is excellent and he denies having N/V/C, though he gets diarrhea occasionally after Darzalex. He continues having discomfort in his right lower jaw and will see his dentist on 1/26.  He went to [facility] on 12/21 and had myeloma labs done there; he is to return in March. He got his COVID booster on 1/11.  Overall, he feels ready for next cycle of immunotherapy today.    REVIEW OF SYSTEMS:  Review of Systems  Constitutional: Positive for fatigue (75%). Negative for appetite change.  Cardiovascular: Positive for leg swelling.  Gastrointestinal: Positive for diarrhea (post-Tx). Negative for constipation, nausea and vomiting.  All other systems reviewed and are negative.   PAST MEDICAL/SURGICAL HISTORY:  Past Medical History:  Diagnosis Date  . Anxiety   . Bone metastases ([place]) 09/10/2016  . Breast cancer ([place]) 2011   Stage IV breast cancer; radiation and tamoxifen  . Breast cancer, male ([place])    Stage IV breast cancer; radiation and tamoxifen  Overview:  Left breast ca with mets bone  Overview:  METS TO BONE  . GERD (gastroesophageal reflux disease)   . Hypertension   . Macular degeneration   . Multiple myeloma ([place]) 02/18/2017  . Peripheral neuropathy    Past Surgical History:  Procedure Laterality Date  . BACK SURGERY    . CATARACT EXTRACTION W/PHACO Left 02/03/2019   Procedure: CATARACT EXTRACTION PHACO AND INTRAOCULAR LENS PLACEMENT ([place]);  Surgeon: Baruch Goldmann, MD;  Location: [facility];  Service: Ophthalmology;   Laterality: Left;  CDE: 15.89  . HERNIA REPAIR    . PORTACATH PLACEMENT Left 06/10/2018   Procedure: INSERTION PORT-A-CATH;  Surgeon: Virl Cagey, MD;  Location: [facility];  Service: General;  Laterality: Left;  . right arm surgery      SOCIAL HISTORY:  Social History   Socioeconomic History  . Marital status: Married    Spouse name: Not on file  . Number of children: 3  . Years of education: Not on file  . Highest education level: Not on file  Occupational History  . Not on file  Tobacco Use  . Smoking status: Never Smoker  . Smokeless tobacco: Former Network engineer and Sexual Activity  . Alcohol use: Yes    Alcohol/week: 24.0 standard drinks    Types: 24 Cans of beer per week  . Drug use: No  . Sexual activity: Not on file  Other Topics Concern  . Not on file  Social History Narrative  . Not on file   Social Determinants of Health   Financial Resource Strain: Low Risk   . Difficulty of Paying Living Expenses: Not hard at all  Food Insecurity: No Food Insecurity  . Worried About Charity fundraiser in the Last  Year: Never true  . Ran Out of Food in the Last Year: Never true  Transportation Needs: No Transportation Needs  . Lack of Transportation (Medical): No  . Lack of Transportation (Non-Medical): No  Physical Activity: Inactive  . Days of Exercise per Week: 0 days  . Minutes of Exercise per Session: 0 min  Stress: Stress Concern Present  . Feeling of Stress : To some extent  Social Connections: Moderately Integrated  . Frequency of Communication with Friends and Family: More than three times a week  . Frequency of Social Gatherings with Friends and Family: Three times a week  . Attends Religious Services: 1 to 4 times per year  . Active Member of Clubs or Organizations: No  . Attends Archivist Meetings: Never  . Marital Status: Married  Human resources officer Violence: Not At Risk  . Fear of Current or Ex-Partner: No  . Emotionally Abused: No  .  Physically Abused: No  . Sexually Abused: No    FAMILY HISTORY:  Family History  Problem Relation Age of Onset  . Stroke Mother   . Cancer Maternal Aunt        cancer NOS; died in her 30s  . Lung cancer Maternal Uncle        smoker    CURRENT MEDICATIONS:  Current Outpatient Medications  Medication Sig Dispense Refill  . acyclovir (ZOVIRAX) 800 MG tablet TAKE 1 TABLET TWICE DAILY 180 tablet 3  . aspirin EC 81 MG tablet Take 81 mg by mouth daily.     Marland Kitchen augmented betamethasone dipropionate (DIPROLENE-AF) 0.05 % cream Apply topically as needed. 30 g 6  . calcium-vitamin D (OSCAL WITH D) 500-200 MG-UNIT TABS tablet TAKE TWO TABLETS BY MOUTH DAILY 60 tablet 6  . Cholecalciferol (VITAMIN D) 50 MCG (2000 UT) CAPS Take 4,000 Units by mouth 2 (two) times daily.     Marland Kitchen DARATUMUMAB-HYALURONIDASE-FIHJ [place] Inject 1,800 mg into the skin every 14 (fourteen) days.     Marland Kitchen denosumab (XGEVA) 120 MG/1.7ML SOLN injection Inject 120 mg into the skin every 30 (thirty) days.     Marland Kitchen dexamethasone (DECADRON) 4 MG tablet Take 5 pills weekly. 40 tablet 5  . diclofenac Sodium (VOLTAREN) 1 % GEL Use 1 to 2 times per day as directed by doctor    . folic acid (FOLVITE) 1 MG tablet TAKE 1 TABLET BY MOUTH DAILY 90 tablet 2  . gabapentin (NEURONTIN) 300 MG capsule Take 1 capsule (300 mg total) by mouth 3 (three) times daily. 180 capsule 2  . hydrALAZINE (APRESOLINE) 50 MG tablet Take 1 tablet (50 mg total) by mouth every 8 (eight) hours. 90 tablet 0  . loratadine (CLARITIN) 10 MG tablet Take 1 tablet (10 mg total) by mouth daily. 90 tablet 1  . metoprolol succinate (TOPROL-XL) 100 MG 24 hr tablet Take 100 mg by mouth daily.     . Multiple Vitamin (MULTIVITAMIN WITH MINERALS) TABS tablet Take 1 tablet by mouth daily.     . ondansetron (ZOFRAN) 8 MG tablet TAKE 1 TABLET BY MOUTH EVERY 8 HOURS AS NEEDED FOR NAUSEA AND VOMITING 30 tablet 2  . pantoprazole (PROTONIX) 40 MG tablet Take 40 mg by mouth every other day.     .  polyethylene glycol (MIRALAX / GLYCOLAX) packet Take 17 g by mouth daily as needed for mild constipation. 14 each 0  . POMALYST 2 MG capsule TAKE 1 CAPSULE BY MOUTH EVERY DAY 21 capsule 0  . POMALYST 2 MG capsule  TAKE 1 CAPSULE BY MOUTH DAILY 21 capsule 0  . prochlorperazine (COMPAZINE) 10 MG tablet Take 1 tablet (10 mg total) by mouth every 6 (six) hours as needed for nausea or vomiting. 30 tablet 2  . pseudoephedrine-guaifenesin (MUCINEX D) 60-600 MG 12 hr tablet Take 1 tablet by mouth 2 (two) times daily as needed for congestion.     . tamoxifen (NOLVADEX) 20 MG tablet Take 1 tablet (20 mg total) by mouth daily. 90 tablet 3  . vitamin B-12 (CYANOCOBALAMIN) 1000 MCG tablet Take 1,000 mcg by mouth daily.     Marland Kitchen acetaminophen (TYLENOL) 500 MG tablet Take 500 mg by mouth every 6 (six) hours as needed.  (Patient not taking: Reported on 01/21/2021)    . ALPRAZolam (XANAX) 0.5 MG tablet TAKE 1 TABLET BY MOUTH AT BEDTIME AS NEEDED FOR ANXIETY OR SLEEP (Patient not taking: Reported on 01/21/2021) 30 tablet 3   No current facility-administered medications for this visit.   Facility-Administered Medications Ordered in Other Visits  Medication Dose Route Frequency Provider Last Rate Last Admin  . acetaminophen (TYLENOL) tablet 650 mg  650 mg Oral Once Derek Jack, MD      . daratumumab-hyaluronidase-fihj [facility] FASPRO) 1800-30000 MG-UT/15ML chemo SQ injection 1,800 mg  1,800 mg Subcutaneous Once Derek Jack, MD      . dexamethasone (DECADRON) tablet 20 mg  20 mg Oral Once Derek Jack, MD      . diphenhydrAMINE (BENADRYL) capsule 50 mg  50 mg Oral Once Derek Jack, MD      . ondansetron [facility]) 8 mg in sodium chloride 0.9 % 50 mL IVPB  8 mg Intravenous Once Lockamy, Randi L, NP-C      . sodium chloride flush (NS) 0.9 % injection 10 mL  10 mL Intracatheter Once PRN Derek Jack, MD        ALLERGIES:  Allergies  Allergen Reactions  . Cefepime     Suspected  severe thrombocytopenia is a result of cefepime induced antigen platelet destruction    PHYSICAL EXAM:  Performance status (ECOG): 1 - Symptomatic but completely ambulatory  Vitals:   01/21/21 1236  BP: (!) 158/79  Pulse: 67  Resp: 18  Temp: (!) 97.2 F (36.2 C)  SpO2: 96%   Wt Readings from Last 3 Encounters:  01/21/21 214 lb 12.8 oz (97.4 kg)  12/24/20 208 lb 9.6 oz (94.6 kg)  11/26/20 208 lb 4.8 oz (94.5 kg)   Physical Exam Vitals reviewed.  Constitutional:      Appearance: Normal appearance.  Cardiovascular:     Rate and Rhythm: Normal rate and regular rhythm.     Pulses: Normal pulses.     Heart sounds: Normal heart sounds.  Pulmonary:     Effort: Pulmonary effort is normal.     Breath sounds: Normal breath sounds.  Chest:     Comments: Port-a-Cath in L chest Musculoskeletal:     Right lower leg: Edema (1+) present.     Left lower leg: Edema (1+) present.  Neurological:     General: No focal deficit present.     Mental Status: He is alert and oriented to person, place, and time.  Psychiatric:        Mood and Affect: Mood normal.        Behavior: Behavior normal.     LABORATORY DATA:  I have reviewed the labs as listed.  CBC Latest Ref Rng & Units 01/14/2021 12/24/2020 11/26/2020  WBC 4.0 - 10.5 K/uL 10.3 2.2(L) 3.8(L)  Hemoglobin  13.0 - 17.0 g/dL 12.1(L) 11.0(L) 11.2(L)  Hematocrit 39.0 - 52.0 % 37.1(L) 33.7(L) 35.7(L)  Platelets 150 - 400 K/uL 237 156 160   CMP Latest Ref Rng & Units 01/14/2021 12/24/2020 11/26/2020  Glucose 70 - 99 mg/dL 122(H) 120(H) 101(H)  BUN 8 - 23 mg/dL $Remove'21 15 11  'vKJUNXI$ Creatinine 0.61 - 1.24 mg/dL 1.57(H) 1.38(H) 1.23  Sodium 135 - 145 mmol/L 134(L) 136 136  Potassium 3.5 - 5.1 mmol/L 4.4 4.4 4.1  Chloride 98 - 111 mmol/L 102 108 107  CO2 22 - 32 mmol/L 21(L) 21(L) 22  Calcium 8.9 - 10.3 mg/dL 8.5(L) 7.7(L) 8.0(L)  Total Protein 6.5 - 8.1 g/dL 6.2(L) 5.4(L) 5.5(L)  Total Bilirubin 0.3 - 1.2 mg/dL 0.5 0.4 0.6  Alkaline Phos 38 - 126  U/L 30(L) 25(L) 26(L)  AST 15 - 41 U/L 16 14(L) 13(L)  ALT 0 - 44 U/L $Remo'16 13 14    'TcgTd$ DIAGNOSTIC IMAGING:  I have independently reviewed the scans and discussed with the patient. No results found.   ASSESSMENT:  1. Relapsed multiple myeloma: -Autologous stem cell transplant on 10/08/2017 -Post transplant consolidation withCyCarD for 2 cycles with M spike undetectable. -Maintenance therapy with carfilzomib 70 mg per metered square days 1 and 15 every 28 days from 04/20/2018, dose reduced to 35 mg per metered square on 01/05/2019, titrated up to 56 mg per metered square on 01/09/2020. 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 -PET scan on 02/18/2020 showed new left supraclavicular lymph node at the thoracic inlet, SUV 14.2. Multifocal osseous lesions largely improved, though residual lesions noted including right acromion, left acromion, thyroid cartilage, right sternum. Additional lesions throughout the sternum, bilateral ribs, thoracolumbar spine, bilateral pelvis have resolved. -BMBX on 02/14/2020 shows plasma cell myeloma, 60-70% of cells. FISH for myeloma was negative. Cytogenetics was normal. -4 cycles of daratumumab, bortezomib and dexamethasone from 02/28/2020 through 04/29/2020. -Daratumumab, pomalidomide and dexamethasone started on 05/28/2020. He is receiving daratumumab every 2 weeks, Pomalyst 3 mg days 1-21, dexamethasone 20 mg weekly. -Pomalidomide dose reduced to 2 mg days 1-21 around the first week of July 2021, due to severe weakness.   PLAN:  1. Relapsed IgG kappa multiple myeloma: -He is continuing to tolerate pomalidomide 2 mg 3 weeks on 1 week off.  He is taking dexamethasone 20 mg weekly. -He is also tolerating daratumumab monthly very well. -I have reviewed his myeloma labs from 12/17/2020 [facility].  M spike is too small to quantitate.  Light chain ratio cannot be calculated because both the light chains were  very low. -We will continue current treatment plan. -RTC 8 weeks with repeat myeloma labs.  2. Myeloma bone disease: -We will hold his Delton See as he is seeing dentist tomorrow.  He reports some problem in the right lower jaw.  3. Macrocytic anemia: -Hemoglobin today is 12.1.  No growth factor needed unless hemoglobin drops below 9.  4. ID prophylaxis: -Continue acyclovir twice daily.  5. Peripheral neuropathy: -Continue gabapentin 300 mg 3 times a day.  6. History of breast cancer: -Continue tamoxifen daily.   Orders placed this encounter:  Orders Placed This Encounter  Procedures  . Immunofixation electrophoresis  . Kappa/lambda light chains  . Protein electrophoresis, serum     Derek Jack, MD [place] [phone]   I, Milinda Antis, am acting as a scribe for Dr. Sanda Linger.  I, Derek Jack MD, have reviewed the above documentation for accuracy and completeness,  and I agree with the above.

## 2021-01-21 NOTE — Patient Instructions (Signed)
[facility] at [facility] Discharge Instructions  You were seen today by Dr. Delton Coombes. He went over your recent results. You received your treatment today; continue getting your treatment every month but stop the Xgeva this week. Dr. Delton Coombes will see you back in 8 weeks for labs and follow up.   Thank you for choosing [place] at [facility] to provide your oncology and hematology care.  To afford each patient quality time with our provider, please arrive at least 15 minutes before your scheduled appointment time.   If you have a lab appointment with the [place] please come in thru the Main Entrance and check in at the main information desk  You need to re-schedule your appointment should you arrive 10 or more minutes late.  We strive to give you quality time with our providers, and arriving late affects you and other patients whose appointments are after yours.  Also, if you no show three or more times for appointments you may be dismissed from the clinic at the providers discretion.     Again, thank you for choosing [facility].  Our hope is that these requests will decrease the amount of time that you wait before being seen by our physicians.       _____________________________________________________________  Should you have questions after your visit to [facility], please contact our office at [phone] between the hours of 8:00 a.m. and 4:30 p.m.  Voicemails left after 4:00 p.m. will not be returned until the following business day.  For prescription refill requests, have your pharmacy contact our office and allow 72 hours.    Cancer [facility] Support Programs:   > Cancer Support Group  2nd Tuesday of the month 1pm-2pm, Journey Room

## 2021-01-21 NOTE — Progress Notes (Signed)
Patient assessed and labs reviewed by Dr. Delton Coombes. Okay to proceed with treatment. No Xgeva injection until after he sees the dentist per Dr. Raliegh Ip. Primary RN and pharmacy aware.

## 2021-01-21 NOTE — Progress Notes (Signed)
Johnny Lucas presents today for D1C19 daratumumab. Pt denies any new changes or symptoms since last treatment. Lab results, including Cr 1.57, and vitals have been reviewed and are stable and within parameters for treatment. Patient has been assessed by Dr. Delton Coombes who has approved proceeding with treatment today as planned.  Injection tolerated without incident or complaint. Discharged in satisfactory condition with follow up instructions.

## 2021-01-22 ENCOUNTER — Inpatient Hospital Stay (HOSPITAL_COMMUNITY): Payer: Medicare Other

## 2021-01-27 ENCOUNTER — Other Ambulatory Visit (HOSPITAL_COMMUNITY): Payer: Self-pay

## 2021-01-27 DIAGNOSIS — C9000 Multiple myeloma not having achieved remission: Secondary | ICD-10-CM

## 2021-01-27 MED ORDER — ALPRAZOLAM 0.5 MG PO TABS: ORAL_TABLET | ORAL | 5 refills | Status: DC

## 2021-02-01 ENCOUNTER — Other Ambulatory Visit (HOSPITAL_COMMUNITY): Payer: Self-pay | Admitting: Hematology

## 2021-02-01 DIAGNOSIS — G4700 Insomnia, unspecified: Secondary | ICD-10-CM

## 2021-02-01 DIAGNOSIS — I10 Essential (primary) hypertension: Secondary | ICD-10-CM

## 2021-02-05 NOTE — Telephone Encounter (Signed)
Chart reviewed. Pomalyst refilled per Dr. Delton Coombes

## 2021-02-18 ENCOUNTER — Inpatient Hospital Stay (HOSPITAL_COMMUNITY): Payer: Medicare Other

## 2021-02-18 ENCOUNTER — Other Ambulatory Visit: Payer: Self-pay

## 2021-02-18 ENCOUNTER — Inpatient Hospital Stay (HOSPITAL_COMMUNITY): Payer: Medicare Other | Attending: Hematology | Admitting: Hematology

## 2021-02-18 VITALS — BP 133/71 | HR 59 | Temp 97.3°F | Resp 18 | Wt 213.5 lb

## 2021-02-18 DIAGNOSIS — C9000 Multiple myeloma not having achieved remission: Secondary | ICD-10-CM | POA: Insufficient documentation

## 2021-02-18 DIAGNOSIS — I10 Essential (primary) hypertension: Secondary | ICD-10-CM | POA: Diagnosis not present

## 2021-02-18 DIAGNOSIS — C9002 Multiple myeloma in relapse: Secondary | ICD-10-CM

## 2021-02-18 DIAGNOSIS — K219 Gastro-esophageal reflux disease without esophagitis: Secondary | ICD-10-CM | POA: Insufficient documentation

## 2021-02-18 DIAGNOSIS — Z923 Personal history of irradiation: Secondary | ICD-10-CM | POA: Diagnosis not present

## 2021-02-18 DIAGNOSIS — Z949 Transplanted organ and tissue status, unspecified: Secondary | ICD-10-CM | POA: Diagnosis not present

## 2021-02-18 DIAGNOSIS — G629 Polyneuropathy, unspecified: Secondary | ICD-10-CM | POA: Insufficient documentation

## 2021-02-18 DIAGNOSIS — Z7982 Long term (current) use of aspirin: Secondary | ICD-10-CM | POA: Insufficient documentation

## 2021-02-18 DIAGNOSIS — Z5111 Encounter for antineoplastic chemotherapy: Secondary | ICD-10-CM | POA: Insufficient documentation

## 2021-02-18 LAB — MAGNESIUM: Magnesium: 1.9 mg/dL (ref 1.7–2.4)

## 2021-02-18 LAB — CBC WITH DIFFERENTIAL/PLATELET
Abs Immature Granulocytes: 0.01 10*3/uL (ref 0.00–0.07)
Basophils Absolute: 0.1 10*3/uL (ref 0.0–0.1)
Basophils Relative: 3 %
Eosinophils Absolute: 0.6 10*3/uL — ABNORMAL HIGH (ref 0.0–0.5)
Eosinophils Relative: 13 %
HCT: 34.2 % — ABNORMAL LOW (ref 39.0–52.0)
Hemoglobin: 11.1 g/dL — ABNORMAL LOW (ref 13.0–17.0)
Immature Granulocytes: 0 %
Lymphocytes Relative: 17 %
Lymphs Abs: 0.8 10*3/uL (ref 0.7–4.0)
MCH: 35.4 pg — ABNORMAL HIGH (ref 26.0–34.0)
MCHC: 32.5 g/dL (ref 30.0–36.0)
MCV: 108.9 fL — ABNORMAL HIGH (ref 80.0–100.0)
Monocytes Absolute: 0.7 10*3/uL (ref 0.1–1.0)
Monocytes Relative: 15 %
Neutro Abs: 2.4 10*3/uL (ref 1.7–7.7)
Neutrophils Relative %: 52 %
Platelets: 150 10*3/uL (ref 150–400)
RBC: 3.14 MIL/uL — ABNORMAL LOW (ref 4.22–5.81)
RDW: 15.7 % — ABNORMAL HIGH (ref 11.5–15.5)
WBC: 4.7 10*3/uL (ref 4.0–10.5)
nRBC: 0 % (ref 0.0–0.2)

## 2021-02-18 LAB — COMPREHENSIVE METABOLIC PANEL
ALT: 15 U/L (ref 0–44)
AST: 16 U/L (ref 15–41)
Albumin: 3.5 g/dL (ref 3.5–5.0)
Alkaline Phosphatase: 26 U/L — ABNORMAL LOW (ref 38–126)
Anion gap: 6 (ref 5–15)
BUN: 17 mg/dL (ref 8–23)
CO2: 25 mmol/L (ref 22–32)
Calcium: 8.7 mg/dL — ABNORMAL LOW (ref 8.9–10.3)
Chloride: 105 mmol/L (ref 98–111)
Creatinine, Ser: 1.63 mg/dL — ABNORMAL HIGH (ref 0.61–1.24)
GFR, Estimated: 44 mL/min — ABNORMAL LOW (ref >60)
Glucose, Bld: 99 mg/dL (ref 70–99)
Potassium: 4.1 mmol/L (ref 3.5–5.1)
Sodium: 136 mmol/L (ref 135–145)
Total Bilirubin: 0.6 mg/dL (ref 0.3–1.2)
Total Protein: 5.6 g/dL — ABNORMAL LOW (ref 6.5–8.1)

## 2021-02-18 MED ORDER — DEXAMETHASONE 4 MG PO TABS: 20.0000 mg | ORAL_TABLET | Freq: Once | ORAL | Status: DC

## 2021-02-18 MED ORDER — DARATUMUMAB-HYALURONIDASE-FIHJ 1800-30000 MG-UT/15ML ~~LOC~~ SOLN
1800.0000 mg | Freq: Once | SUBCUTANEOUS | Status: AC
Start: 2021-02-18 — End: 2021-02-18
  Administered 2021-02-18: 1800 mg via SUBCUTANEOUS
  Filled 2021-02-18: qty 15

## 2021-02-18 MED ORDER — ACETAMINOPHEN 325 MG PO TABS: 650.0000 mg | ORAL_TABLET | Freq: Once | ORAL | Status: DC

## 2021-02-18 MED ORDER — DIPHENHYDRAMINE HCL 25 MG PO CAPS: 50.0000 mg | ORAL_CAPSULE | Freq: Once | ORAL | Status: DC

## 2021-02-18 NOTE — Progress Notes (Signed)
Patient presents today for treatment and follow up visit with dr. Delton Coombes. Labs reviewed by MD. Creatinine 1.63 today. MD aware.   Message received from [place] LPN/ Dr. Delton Coombes to proceed with treatment. HOLD XGeva for the next 2 DOSES per Dr. Delton Coombes / CEdwards LPN. Patient having extensive dental work. Patient took pre-medications at home prior to arrival.   Treatment  given today per MD orders. Tolerated without adverse affects. Vital signs stable. No complaints at this time. Discharged from clinic ambulatory in stable condition. Alert and oriented x 3. F/U with [facility] as scheduled.

## 2021-02-18 NOTE — Patient Instructions (Signed)
[place] at [facility] Discharge Instructions  You were seen today by Dr. Delton Coombes. He went over your recent results. You received your treatment today and get your next treatment in 4 weeks; your Xgeva injection will be held for the next 2 months due to your tooth extraction. Dr. Delton Coombes will see you back in 2 months for labs and follow up.   Thank you for choosing [place] at [facility] to provide your oncology and hematology care.  To afford each patient quality time with our provider, please arrive at least 15 minutes before your scheduled appointment time.   If you have a lab appointment with the [place] please come in thru the Main Entrance and check in at the main information desk  You need to re-schedule your appointment should you arrive 10 or more minutes late.  We strive to give you quality time with our providers, and arriving late affects you and other patients whose appointments are after yours.  Also, if you no show three or more times for appointments you may be dismissed from the clinic at the providers discretion.     Again, thank you for choosing [facility].  Our hope is that these requests will decrease the amount of time that you wait before being seen by our physicians.       _____________________________________________________________  Should you have questions after your visit to [facility], please contact our office at [phone] between the hours of 8:00 a.m. and 4:30 p.m.  Voicemails left after 4:00 p.m. will not be returned until the following business day.  For prescription refill requests, have your pharmacy contact our office and allow 72 hours.    Cancer Center Support Programs:   > Cancer Support Group  2nd Tuesday of the month 1pm-2pm, Journey Room

## 2021-02-18 NOTE — Progress Notes (Signed)
Johnny Lucas, Johnny Lucas   CLINIC:  Medical Oncology/Hematology  PCP:  Johnny Percy, MD [address] [place] [address] [phone]   REASON FOR VISIT:  Follow-up for relapsed multiple myeloma  PRIOR THERAPY:  1. RVD x 6 cycles from 03/01/2017 to 06/29/2017. 2. Stem cell transplant on 09/30/2017. 3. Carfilzomib and cyclophosphamide x 2 cycles from 02/14/2018 to 03/29/2018. 4. Carfilzomib x 22 cycles from 04/20/2018 to 01/23/2020.  NGS Results: Not done  CURRENT THERAPY: Darzalex Faspro every month; Pomalyst 2 mg 3/4 weeks; Xgeva every month  BRIEF ONCOLOGIC HISTORY:  Oncology History  Breast cancer, male ([facility])  12/31/2009 Initial Biopsy   Biopsy of L breast    12/31/2009 Pathology Results   Invasive ductal carcinoma, ER/PR+, HER 2 negative   12/31/2009 Imaging   Ultrasound showing a 2.43 x 1.85 x 3 cm hypoechoic spiculated mass in the 12 o clock L breast retroareolar region   01/01/2010 -  Anti-estrogen oral therapy   Tamoxifen 20 mg daily   01/06/2010 Imaging   Bone scan abnormal uptake in the diaphysis of the R humerus, abnormal in the R third, fifth and sixth ribs, lesion also noted in the sternum.   02/03/2010 Surgery   Rod placement and fixation of R humerus by [facility]   02/05/2010 - 02/18/2010 Radiation Therapy   30Gy in 10 fractions of 3 Gy per fraction to R pathologic fracture   03/11/2010 -  Chemotherapy   Denosumab monthly, now every 3 months. Started at [facility]    06/09/2016 Imaging   Three hypermetabolic osseous lesions in the sternum, left ilium and right ilium, as discussed above, likely represent osseous metastases. At this time, these are not recognizable on the CT images. 2. No extra skeletal metastatic disease identified in the neck, chest, abdomen or pelvis.   10/13/2016 Progression   PET shows various new and enlarging osseous metastatic lesions with no definite extra osseous metastatic disease currently  identified.    12/31/2016 Progression   1. Multifocal hypermetabolic osseous metastases throughout the axial and proximal appendicular skeleton, which are increased in size, number and metabolism since 10/13/2016 PET-CT. 2. 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 3. No additional sites of hypermetabolic metastatic disease. 4. Chronic right mastoid sinusitis. 5. Aortic atherosclerosis.  One vessel coronary atherosclerosis.   Multiple myeloma (John[facility] Lucas)  02/12/2017 Bone Marrow Biopsy   The marrow was variably cellular with large peritrabecular aggregates of kappa restricted plasma cells (66% by aspirate, 30% by Cd138). Cytogenetics +11.    03/01/2017 - 06/29/2017 Chemotherapy   RVD    05/26/2017 Bone Marrow Biopsy   Performed at [facility]:  Plasma cell myeloma in a 30% cellularmarrow with decreased trilineage hematopoiesis and 42% kappalight chain restricted plasma cells on the aspirate smears andlarge aggregates on the core biopsy.    07/12/2017 - 09/01/2017 Chemotherapy   3 cycles of carfizolmib/cyclophosphamide/dexamethasone     10/08/2017 Bone Marrow Transplant   Autotransplant at [facility]   02/28/2020 -  Chemotherapy    Patient is on Treatment Plan: DARATUMUMAB Q14D      Multiple myeloma not having achieved remission (Johnny Lucas)  06/29/2017 Initial Diagnosis   Multiple myeloma not having achieved remission (Johnny Lucas)   02/28/2020 -  Chemotherapy    Patient is on Treatment Plan: DARATUMUMAB Q14D        CANCER  STAGING: Cancer Staging Multiple myeloma (Johnny Lucas) Staging form: Plasma Cell Myeloma and Plasma Cell Disorders, AJCC 8th Edition - Clinical stage from 05/03/2017: Beta-2-microglobulin (mg/L): 3.5, Albumin (g/dL): 3.4, ISS: Stage II, High-risk cytogenetics: Absent, LDH: Not assessed - Signed by Johnny Newer, PA-C on 05/03/2017   INTERVAL  HISTORY:  Johnny Lucas, a 75 y.o. male, returns for routine follow-up and consideration for next cycle of immunotherapy. Johnny Lucas was last seen on 01/21/2021.  Due for cycle #20 of Darzalex Faspro today.   Overall, he tells me he has been feeling pretty well. He had a tooth extraction on March 07, 2023 for a dead nerve and tolerated it well; he is currently on an antibiotic. He is taking Pomalyst 2 mg for 3 weeks on and 1 week off. He notes having cramping in his left forearm due to back problems which is chronic. He continues taking gabapentin and acyclovir daily and takes Decadron weekly. His neuropathy in his feet is stable. He denies having any recent infections.  Overall, he feels ready for next cycle of immunotherapy today.    REVIEW OF SYSTEMS:  Review of Systems  Constitutional: Positive for fatigue (75%). Negative for appetite change.  Musculoskeletal: Positive for myalgias (cramping in hands).  Neurological: Positive for numbness (feet; stable).  All other systems reviewed and are negative.   PAST MEDICAL/SURGICAL HISTORY:  Past Medical History:  Diagnosis Date  . Anxiety   . Bone metastases (Johnny Lucas) 09/10/2016  . Breast cancer (Johnny Lucas) 2011   Stage IV breast cancer; radiation and tamoxifen  . Breast cancer, male (Johnny Lucas)    Stage IV breast cancer; radiation and tamoxifen  Overview:  Left breast ca with mets bone  Overview:  METS TO BONE  . GERD (gastroesophageal reflux disease)   . Hypertension   . Macular degeneration   . Multiple myeloma (Johnny Lucas) 02/18/2017  . Peripheral neuropathy    Past Surgical History:  Procedure Laterality Date  . BACK SURGERY    . CATARACT EXTRACTION W/PHACO Left 02/03/2019   Procedure: CATARACT EXTRACTION PHACO AND INTRAOCULAR LENS PLACEMENT (IOC);  Surgeon: Johnny Pierce, MD;  Location: [facility];  Service: Ophthalmology;  Laterality: Left;  CDE: 15.89  . HERNIA REPAIR    . PORTACATH PLACEMENT Left 06/10/2018   Procedure: INSERTION PORT-A-CATH;  Surgeon: Johnny Roers, MD;  Location: [facility];  Service: General;  Laterality: Left;  . right arm surgery      SOCIAL HISTORY:  Social History   Socioeconomic History  . Marital status: Married    Spouse name: Not on file  . Number of children: 3  . Years of education: Not on file  . Highest education level: Not on file  Occupational History  . Not on file  Tobacco Use  . Smoking status: Never Smoker  . Smokeless tobacco: Former Engineer, water and Sexual Activity  . Alcohol use: Yes    Alcohol/week: 24.0 standard drinks    Types: 24 Cans of beer per week  . Drug use: No  . Sexual activity: Not on file  Other Topics Concern  . Not on file  Social History Narrative  . Not on file   Social Determinants of Health   Financial Resource Strain: Low Risk   . Difficulty of Paying Living Expenses: Not hard at all  Food Insecurity: No Food Insecurity  . Worried About Programme researcher, broadcasting/film/video in the Last Year: Never true  . Ran Out of Food in the Last Year: Never true  Transportation  Needs: No Transportation Needs  . Lack of Transportation (Medical): No  . Lack of Transportation (Non-Medical): No  Physical Activity: Inactive  . Days of Exercise per Week: 0 days  . Minutes of Exercise per Session: 0 min  Stress: Stress Concern Present  . Feeling of Stress : To some extent  Social Connections: Moderately Integrated  . Frequency of Communication with Friends and Family: More than three times a week  . Frequency of Social Gatherings with Friends and Family: Three times a week  . Attends Religious Services: 1 to 4 times per year  . Active Member of Clubs or Organizations: No  . Attends Archivist Meetings: Never  . Marital Status: Married  Human resources officer Violence: Not At Risk  . Fear of Current or Ex-Partner: No  . Emotionally Abused: No  . Physically Abused: No  . Sexually Abused: No    FAMILY HISTORY:  Family History  Problem Relation Age of Onset  . Stroke Mother   . Cancer  Maternal Aunt        cancer NOS; died in her 20s  . Lung cancer Maternal Uncle        smoker    CURRENT MEDICATIONS:  Current Outpatient Medications  Medication Sig Dispense Refill  . acetaminophen (TYLENOL) 500 MG tablet Take 500 mg by mouth every 6 (six) hours as needed.    Marland Kitchen acyclovir (ZOVIRAX) 800 MG tablet TAKE 1 TABLET TWICE DAILY 180 tablet 3  . ALPRAZolam (XANAX) 0.5 MG tablet 1 tab PO QHS PRN anxiety/sleep 30 tablet 5  . aspirin EC 81 MG tablet Take 81 mg by mouth daily.     Marland Kitchen augmented betamethasone dipropionate (DIPROLENE-AF) 0.05 % cream Apply topically as needed. 30 g 6  . calcium-vitamin D (OSCAL WITH D) 500-200 MG-UNIT TABS tablet TAKE TWO TABLETS BY MOUTH DAILY 60 tablet 6  . Cholecalciferol (VITAMIN D) 50 MCG (2000 UT) CAPS Take 4,000 Units by mouth 2 (two) times daily.     Marland Kitchen DARATUMUMAB-HYALURONIDASE-FIHJ [place] Inject 1,800 mg into the skin every 14 (fourteen) days.     Marland Kitchen denosumab (XGEVA) 120 MG/1.7ML SOLN injection Inject 120 mg into the skin every 30 (thirty) days.     Marland Kitchen dexamethasone (DECADRON) 4 MG tablet Take 5 pills weekly. 40 tablet 5  . diclofenac Sodium (VOLTAREN) 1 % GEL Use 1 to 2 times per day as directed by doctor    . folic acid (FOLVITE) 1 MG tablet TAKE 1 TABLET BY MOUTH DAILY 90 tablet 2  . gabapentin (NEURONTIN) 300 MG capsule Take 1 capsule (300 mg total) by mouth 3 (three) times daily. 180 capsule 2  . hydrALAZINE (APRESOLINE) 50 MG tablet Take 1 tablet (50 mg total) by mouth every 8 (eight) hours. 90 tablet 0  . loratadine (CLARITIN) 10 MG tablet Take 1 tablet (10 mg total) by mouth daily. 90 tablet 1  . metoprolol succinate (TOPROL-XL) 100 MG 24 hr tablet Take 100 mg by mouth daily.     . Multiple Vitamin (MULTIVITAMIN WITH MINERALS) TABS tablet Take 1 tablet by mouth daily.     . ondansetron (ZOFRAN) 8 MG tablet TAKE 1 TABLET BY MOUTH EVERY 8 HOURS AS NEEDED FOR NAUSEA AND VOMITING 30 tablet 2  . pantoprazole (PROTONIX) 40 MG tablet Take 40 mg by  mouth every other day.     . polyethylene glycol (MIRALAX / GLYCOLAX) packet Take 17 g by mouth daily as needed for mild constipation. 14 each 0  . POMALYST  2 MG capsule TAKE 1 CAPSULE BY MOUTH EVERY DAY 21 capsule 0  . POMALYST 2 MG capsule TAKE 1 CAPSULE BY MOUTH DAILY FOR 21 DAYS 21 capsule 0  . prochlorperazine (COMPAZINE) 10 MG tablet Take 1 tablet (10 mg total) by mouth every 6 (six) hours as needed for nausea or vomiting. 30 tablet 2  . pseudoephedrine-guaifenesin (MUCINEX D) 60-600 MG 12 hr tablet Take 1 tablet by mouth 2 (two) times daily as needed for congestion.     . tamoxifen (NOLVADEX) 20 MG tablet Take 1 tablet (20 mg total) by mouth daily. 90 tablet 3  . vitamin B-12 (CYANOCOBALAMIN) 1000 MCG tablet Take 1,000 mcg by mouth daily.      No current facility-administered medications for this visit.   Facility-Administered Medications Ordered in Other Visits  Medication Dose Route Frequency Provider Last Rate Last Admin  . ondansetron (ZOFRAN) 8 mg in sodium chloride 0.9 % 50 mL IVPB  8 mg Intravenous Once Lockamy, Randi L, NP-C      . sodium chloride flush (NS) 0.9 % injection 10 mL  10 mL Intracatheter Once PRN Derek Jack, MD        ALLERGIES:  Allergies  Allergen Reactions  . Cefepime     Suspected severe thrombocytopenia is a result of cefepime induced antigen platelet destruction    PHYSICAL EXAM:  Performance status (ECOG): 1 - Symptomatic but completely ambulatory  Vitals:   02/18/21 0948  BP: 133/71  Pulse: (!) 59  Resp: 18  Temp: (!) 97.3 F (36.3 C)  SpO2: 99%   Wt Readings from Last 3 Encounters:  02/18/21 213 lb 8 oz (96.8 kg)  01/21/21 214 lb 12.8 oz (97.4 kg)  12/24/20 208 lb 9.6 oz (94.6 kg)   Physical Exam Vitals reviewed.  Constitutional:      Appearance: Normal appearance. He is obese.  Cardiovascular:     Rate and Rhythm: Normal rate and regular rhythm.     Pulses: Normal pulses.     Heart sounds: Normal heart sounds.   Pulmonary:     Effort: Pulmonary effort is normal.     Breath sounds: Normal breath sounds.  Chest:  Breasts:     Right: No supraclavicular adenopathy.     Left: No supraclavicular adenopathy.      Comments: Port-a-Cath in L chest Musculoskeletal:     Left forearm: No edema, tenderness or bony tenderness.     Right lower leg: Edema (trace) present.     Left lower leg: No edema.  Lymphadenopathy:     Upper Body:     Right upper body: No supraclavicular adenopathy.     Left upper body: No supraclavicular adenopathy.  Neurological:     General: No focal deficit present.     Mental Status: He is alert and oriented to person, place, and time.  Psychiatric:        Mood and Affect: Mood normal.        Behavior: Behavior normal.     LABORATORY DATA:  I have reviewed the labs as listed.  CBC Latest Ref Rng & Units 02/18/2021 01/14/2021 12/24/2020  WBC 4.0 - 10.5 K/uL 4.7 10.3 2.2(L)  Hemoglobin 13.0 - 17.0 g/dL 11.1(L) 12.1(L) 11.0(L)  Hematocrit 39.0 - 52.0 % 34.2(L) 37.1(L) 33.7(L)  Platelets 150 - 400 K/uL 150 237 156   CMP Latest Ref Rng & Units 02/18/2021 01/14/2021 12/24/2020  Glucose 70 - 99 mg/dL 99 122(H) 120(H)  BUN 8 - 23 mg/dL 17 21 15  Creatinine 0.61 - 1.24 mg/dL 1.63(H) 1.57(H) 1.38(H)  Sodium 135 - 145 mmol/L 136 134(L) 136  Potassium 3.5 - 5.1 mmol/L 4.1 4.4 4.4  Chloride 98 - 111 mmol/L 105 102 108  CO2 22 - 32 mmol/L 25 21(L) 21(L)  Calcium 8.9 - 10.3 mg/dL 8.7(L) 8.5(L) 7.7(L)  Total Protein 6.5 - 8.1 g/dL 5.6(L) 6.2(L) 5.4(L)  Total Bilirubin 0.3 - 1.2 mg/dL 0.6 0.5 0.4  Alkaline Phos 38 - 126 U/L 26(L) 30(L) 25(L)  AST 15 - 41 U/L 16 16 14(L)  ALT 0 - 44 U/L $Remo'15 16 13   'GSThP$ Lab Results  Component Value Date   TOTALPROTELP 5.1 (L) 11/26/2020   ALBUMINELP 3.3 11/26/2020   A1GS 0.3 11/26/2020   A2GS 0.6 11/26/2020   BETS 0.8 11/26/2020   GAMS 0.2 (L) 11/26/2020   MSPIKE 0.1 (H) 11/26/2020   SPEI Comment 11/26/2020    Lab Results  Component Value Date    KPAFRELGTCHN 1.7 (L) 11/26/2020   LAMBDASER 2.1 (L) 11/26/2020   KAPLAMBRATIO 0.81 11/26/2020    DIAGNOSTIC IMAGING:  I have independently reviewed the scans and discussed with the patient. No results found.   ASSESSMENT:  1. Relapsed multiple myeloma: -Autologous stem cell transplant on 10/08/2017 -Post transplant consolidation withCyCarD for 2 cycles with M spike undetectable. -Maintenance therapy with carfilzomib 70 mg per metered square days 1 and 15 every 28 days from 04/20/2018, dose reduced to 35 mg per metered square on 01/05/2019, titrated up to 56 mg per metered square on 01/09/2020. 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 -PET scan on 02/18/2020 showed new left supraclavicular lymph node at the thoracic inlet, SUV 14.2. Multifocal osseous lesions largely improved, though residual lesions noted including right acromion, left acromion, thyroid cartilage, right sternum. Additional lesions throughout the sternum, bilateral ribs, thoracolumbar spine, bilateral pelvis have resolved. -BMBX on 02/14/2020 shows plasma cell myeloma, 60-70% of cells. FISH for myeloma was negative. Cytogenetics was normal. -4 cycles of daratumumab, bortezomib and dexamethasone from 02/28/2020 through 04/29/2020. -Daratumumab, pomalidomide and dexamethasone started on 05/28/2020. He is receiving daratumumab every 2 weeks, Pomalyst 3 mg days 1-21, dexamethasone 20 mg weekly. -Pomalidomide dose reduced to 2 mg days 1-21 around the first week of July 2021, due to severe weakness.   PLAN:  1. Relapsed IgG kappa multiple myeloma: -He is continuing to tolerate pomalidomide 2 mg 3 weeks on 1 week off. -He is taking dexamethasone 20 mg weekly. -He is also tolerating Darzalex very well. -We have sent myeloma panel from today which is pending.  Last labs from end of December were within normal limits. -Today's LFTs are within normal limits.  Creatinine is slightly high  at 1.63.  We will closely monitor. -He will continue with monthly Darzalex.  RTC 8 weeks for follow-up. -He has left forearm cramping on and off which is new, likely symptoms of neuropathy.  We will closely monitor.  2. Myeloma bone disease: -He had dental extraction few days ago. -Hold Xgeva for the next 2 months.  3. Macrocytic anemia: -Hemoglobin is 11.1.  No growth factor needed unless hemoglobin drops below 9.  4. ID prophylaxis: -Continue acyclovir twice daily.  5. Peripheral neuropathy: -Continue gabapentin 300 mg 3 times a day.  6. History of breast cancer: -Continue tamoxifen daily.   Orders placed this encounter:  No orders of the defined types were placed in this encounter.    Derek Jack, MD [place] [phone]   I, Quillian Quince  Khashchuk, am acting as a scribe for Dr. Sanda Linger.  I, Derek Jack MD, have reviewed the above documentation for accuracy and completeness, and I agree with the above.

## 2021-02-18 NOTE — Patient Instructions (Signed)
[facility] Discharge Instructions for Patients Receiving Chemotherapy  Today you received the following chemotherapy agents   To help prevent nausea and vomiting after your treatment, we encourage you to take your nausea medication   If you develop nausea and vomiting that is not controlled by your nausea medication, call the clinic.   BELOW ARE SYMPTOMS THAT SHOULD BE REPORTED IMMEDIATELY:  *FEVER GREATER THAN 100.5 F  *CHILLS WITH OR WITHOUT FEVER  NAUSEA AND VOMITING THAT IS NOT CONTROLLED WITH YOUR NAUSEA MEDICATION  *UNUSUAL SHORTNESS OF BREATH  *UNUSUAL BRUISING OR BLEEDING  TENDERNESS IN MOUTH AND THROAT WITH OR WITHOUT PRESENCE OF ULCERS  *URINARY PROBLEMS  *BOWEL PROBLEMS  UNUSUAL RASH Items with * indicate a potential emergency and should be followed up as soon as possible.  Feel free to call the clinic should you have any questions or concerns. The clinic phone number is [phone].  Please show the CHEMO ALERT CARD at check-in to the Emergency Department and triage nurse.   

## 2021-02-18 NOTE — Progress Notes (Signed)
Patient was assessed by Dr. Delton Coombes and labs have been reviewed.  Patient is okay to proceed with treatment today. Hold Xgeva next two doses due to recent dental work and bone showing.  Primary RN and pharmacy aware.

## 2021-02-19 LAB — PROTEIN ELECTROPHORESIS, SERUM
A/G Ratio: 1.8 — ABNORMAL HIGH (ref 0.7–1.7)
Albumin ELP: 3.2 g/dL (ref 2.9–4.4)
Alpha-1-Globulin: 0.3 g/dL (ref 0.0–0.4)
Alpha-2-Globulin: 0.6 g/dL (ref 0.4–1.0)
Beta Globulin: 0.8 g/dL (ref 0.7–1.3)
Gamma Globulin: 0.1 g/dL — ABNORMAL LOW (ref 0.4–1.8)
Globulin, Total: 1.8 g/dL — ABNORMAL LOW (ref 2.2–3.9)
Total Protein ELP: 5 g/dL — ABNORMAL LOW (ref 6.0–8.5)

## 2021-02-19 LAB — KAPPA/LAMBDA LIGHT CHAINS
Kappa free light chain: 1.4 mg/L — ABNORMAL LOW (ref 3.3–19.4)
Kappa, lambda light chain ratio: >0.93 (ref 0.26–1.65)
Lambda free light chains: <1.5 mg/L — ABNORMAL LOW (ref 5.7–26.3)

## 2021-02-20 LAB — IMMUNOFIXATION ELECTROPHORESIS
IgA: <5 mg/dL — ABNORMAL LOW (ref 61–437)
IgG (Immunoglobin G), Serum: 136 mg/dL — ABNORMAL LOW (ref 603–1613)
IgM (Immunoglobulin M), Srm: <5 mg/dL — ABNORMAL LOW (ref 15–143)
Total Protein ELP: 5.1 g/dL — ABNORMAL LOW (ref 6.0–8.5)

## 2021-03-02 ENCOUNTER — Other Ambulatory Visit (HOSPITAL_COMMUNITY): Payer: Self-pay | Admitting: Hematology

## 2021-03-02 DIAGNOSIS — I10 Essential (primary) hypertension: Secondary | ICD-10-CM

## 2021-03-02 DIAGNOSIS — G4700 Insomnia, unspecified: Secondary | ICD-10-CM

## 2021-03-06 ENCOUNTER — Encounter (HOSPITAL_COMMUNITY): Payer: Self-pay

## 2021-03-06 NOTE — Progress Notes (Signed)
Chart reviewed. Pomalyst refilled per Dr. Delton Coombes

## 2021-03-17 DIAGNOSIS — Z23 Encounter for immunization: Secondary | ICD-10-CM | POA: Diagnosis not present

## 2021-03-17 DIAGNOSIS — E538 Deficiency of other specified B group vitamins: Secondary | ICD-10-CM | POA: Diagnosis not present

## 2021-03-17 DIAGNOSIS — G629 Polyneuropathy, unspecified: Secondary | ICD-10-CM | POA: Diagnosis not present

## 2021-03-17 DIAGNOSIS — Z7952 Long term (current) use of systemic steroids: Secondary | ICD-10-CM | POA: Diagnosis not present

## 2021-03-17 DIAGNOSIS — T451X5A Adverse effect of antineoplastic and immunosuppressive drugs, initial encounter: Secondary | ICD-10-CM | POA: Diagnosis not present

## 2021-03-17 DIAGNOSIS — G620 Drug-induced polyneuropathy: Secondary | ICD-10-CM | POA: Diagnosis not present

## 2021-03-17 DIAGNOSIS — Z79899 Other long term (current) drug therapy: Secondary | ICD-10-CM | POA: Diagnosis not present

## 2021-03-17 DIAGNOSIS — D539 Nutritional anemia, unspecified: Secondary | ICD-10-CM | POA: Diagnosis not present

## 2021-03-17 DIAGNOSIS — C9000 Multiple myeloma not having achieved remission: Secondary | ICD-10-CM | POA: Diagnosis not present

## 2021-03-17 DIAGNOSIS — Z7982 Long term (current) use of aspirin: Secondary | ICD-10-CM | POA: Diagnosis not present

## 2021-03-17 DIAGNOSIS — C50922 Malignant neoplasm of unspecified site of left male breast: Secondary | ICD-10-CM | POA: Diagnosis not present

## 2021-03-17 DIAGNOSIS — Z923 Personal history of irradiation: Secondary | ICD-10-CM | POA: Diagnosis not present

## 2021-03-17 DIAGNOSIS — Z170 Estrogen receptor positive status [ER+]: Secondary | ICD-10-CM | POA: Diagnosis not present

## 2021-03-17 DIAGNOSIS — C7951 Secondary malignant neoplasm of bone: Secondary | ICD-10-CM | POA: Diagnosis not present

## 2021-03-17 DIAGNOSIS — N1831 Chronic kidney disease, stage 3a: Secondary | ICD-10-CM | POA: Diagnosis not present

## 2021-03-17 DIAGNOSIS — N183 Chronic kidney disease, stage 3 unspecified: Secondary | ICD-10-CM | POA: Diagnosis not present

## 2021-03-17 DIAGNOSIS — Z9484 Stem cells transplant status: Secondary | ICD-10-CM | POA: Diagnosis not present

## 2021-03-17 DIAGNOSIS — Z79810 Long term (current) use of selective estrogen receptor modulators (SERMs): Secondary | ICD-10-CM | POA: Diagnosis not present

## 2021-03-18 ENCOUNTER — Inpatient Hospital Stay (HOSPITAL_COMMUNITY): Payer: Medicare Other | Attending: Hematology

## 2021-03-18 ENCOUNTER — Encounter (HOSPITAL_COMMUNITY): Payer: Self-pay

## 2021-03-18 ENCOUNTER — Ambulatory Visit (HOSPITAL_COMMUNITY): Payer: Medicare Other | Admitting: Hematology

## 2021-03-18 ENCOUNTER — Other Ambulatory Visit: Payer: Self-pay

## 2021-03-18 ENCOUNTER — Inpatient Hospital Stay (HOSPITAL_COMMUNITY): Payer: Medicare Other

## 2021-03-18 VITALS — BP 127/68 | HR 69 | Temp 96.8°F | Resp 18 | Wt 215.2 lb

## 2021-03-18 DIAGNOSIS — Z5112 Encounter for antineoplastic immunotherapy: Secondary | ICD-10-CM | POA: Insufficient documentation

## 2021-03-18 DIAGNOSIS — C9000 Multiple myeloma not having achieved remission: Secondary | ICD-10-CM | POA: Diagnosis not present

## 2021-03-18 MED ORDER — ACETAMINOPHEN 325 MG PO TABS: 650.0000 mg | ORAL_TABLET | Freq: Once | ORAL | Status: DC

## 2021-03-18 MED ORDER — DARATUMUMAB-HYALURONIDASE-FIHJ 1800-30000 MG-UT/15ML ~~LOC~~ SOLN
1800.0000 mg | Freq: Once | SUBCUTANEOUS | Status: AC
  Administered 2021-03-18: 1800 mg via SUBCUTANEOUS
  Filled 2021-03-18: qty 15

## 2021-03-18 MED ORDER — DIPHENHYDRAMINE HCL 25 MG PO CAPS: 50.0000 mg | ORAL_CAPSULE | Freq: Once | ORAL | Status: DC

## 2021-03-18 MED ORDER — DEXAMETHASONE 4 MG PO TABS: 20.0000 mg | ORAL_TABLET | Freq: Once | ORAL | Status: DC

## 2021-03-18 NOTE — Patient Instructions (Signed)
[facility]  Discharge Instructions:   _______________________________________________________________  Thank you for choosing [facility] at [facility] to provide your oncology and hematology care.  To afford each patient quality time with our providers, please arrive at least 15 minutes before your scheduled appointment.  You need to re-schedule your appointment if you arrive 10 or more minutes late.  We strive to give you quality time with our providers, and arriving late affects you and other patients whose appointments are after yours.  Also, if you no show three or more times for appointments you may be dismissed from the clinic.  Again, thank you for choosing [facility] at [facility]. Our hope is that these requests will allow you access to exceptional care and in a timely manner. _______________________________________________________________  If you have questions after your visit, please contact our office at [phone] between the hours of 8:30 a.m. and 5:00 p.m. Voicemails left after 4:30 p.m. will not be returned until the following business day. _______________________________________________________________  For prescription refill requests, have your pharmacy contact our office. _______________________________________________________________  Recommendations made by the consultant and any test results will be sent to your referring physician. _______________________________________________________________ 

## 2021-03-18 NOTE — Progress Notes (Signed)
Patient takes own medications from home an hour before scheduled appointment.  No complaints voiced today and no s/s of distress noted.   Patient tolerated Daratumumab injection with no complaints voiced.  See MAR for details.  Labs reviewed. Injection site clean and dry with no bruising or swelling noted at site.  Band aid applied.  Vss with discharge and left in satisfactory condition with no s/s of distress noted.

## 2021-03-24 ENCOUNTER — Other Ambulatory Visit (HOSPITAL_COMMUNITY): Payer: Self-pay | Admitting: Surgery

## 2021-03-24 DIAGNOSIS — C9000 Multiple myeloma not having achieved remission: Secondary | ICD-10-CM

## 2021-03-24 MED ORDER — GABAPENTIN 300 MG PO CAPS: 300.0000 mg | ORAL_CAPSULE | Freq: Three times a day (TID) | ORAL | 2 refills | Status: DC

## 2021-03-28 ENCOUNTER — Other Ambulatory Visit (HOSPITAL_COMMUNITY): Payer: Self-pay | Admitting: Hematology

## 2021-03-28 DIAGNOSIS — I10 Essential (primary) hypertension: Secondary | ICD-10-CM

## 2021-03-28 DIAGNOSIS — G4700 Insomnia, unspecified: Secondary | ICD-10-CM

## 2021-04-01 NOTE — Telephone Encounter (Signed)
Chart reviewed. Pomalyst refilled per Dr. Delton Coombes

## 2021-04-04 DIAGNOSIS — R059 Cough, unspecified: Secondary | ICD-10-CM | POA: Diagnosis not present

## 2021-04-04 DIAGNOSIS — J069 Acute upper respiratory infection, unspecified: Secondary | ICD-10-CM | POA: Diagnosis not present

## 2021-04-04 DIAGNOSIS — Z20828 Contact with and (suspected) exposure to other viral communicable diseases: Secondary | ICD-10-CM | POA: Diagnosis not present

## 2021-04-15 ENCOUNTER — Inpatient Hospital Stay (HOSPITAL_COMMUNITY): Payer: Medicare Other | Attending: Hematology

## 2021-04-15 ENCOUNTER — Other Ambulatory Visit: Payer: Self-pay

## 2021-04-15 ENCOUNTER — Inpatient Hospital Stay (HOSPITAL_BASED_OUTPATIENT_CLINIC_OR_DEPARTMENT_OTHER): Payer: Medicare Other | Admitting: Hematology

## 2021-04-15 ENCOUNTER — Inpatient Hospital Stay (HOSPITAL_COMMUNITY): Payer: Medicare Other

## 2021-04-15 VITALS — BP 122/67 | HR 73 | Temp 99.4°F | Resp 18 | Wt 211.4 lb

## 2021-04-15 DIAGNOSIS — C9002 Multiple myeloma in relapse: Secondary | ICD-10-CM

## 2021-04-15 DIAGNOSIS — Z923 Personal history of irradiation: Secondary | ICD-10-CM | POA: Insufficient documentation

## 2021-04-15 DIAGNOSIS — C9000 Multiple myeloma not having achieved remission: Secondary | ICD-10-CM | POA: Insufficient documentation

## 2021-04-15 DIAGNOSIS — Z9484 Stem cells transplant status: Secondary | ICD-10-CM | POA: Diagnosis not present

## 2021-04-15 DIAGNOSIS — Z5111 Encounter for antineoplastic chemotherapy: Secondary | ICD-10-CM | POA: Insufficient documentation

## 2021-04-15 LAB — CBC WITH DIFFERENTIAL/PLATELET
Abs Immature Granulocytes: 0.02 10*3/uL (ref 0.00–0.07)
Basophils Absolute: 0.1 10*3/uL (ref 0.0–0.1)
Basophils Relative: 2 %
Eosinophils Absolute: 0.5 10*3/uL (ref 0.0–0.5)
Eosinophils Relative: 13 %
HCT: 33.9 % — ABNORMAL LOW (ref 39.0–52.0)
Hemoglobin: 11.1 g/dL — ABNORMAL LOW (ref 13.0–17.0)
Immature Granulocytes: 1 %
Lymphocytes Relative: 18 %
Lymphs Abs: 0.6 10*3/uL — ABNORMAL LOW (ref 0.7–4.0)
MCH: 35.6 pg — ABNORMAL HIGH (ref 26.0–34.0)
MCHC: 32.7 g/dL (ref 30.0–36.0)
MCV: 108.7 fL — ABNORMAL HIGH (ref 80.0–100.0)
Monocytes Absolute: 0.6 10*3/uL (ref 0.1–1.0)
Monocytes Relative: 18 %
Neutro Abs: 1.6 10*3/uL — ABNORMAL LOW (ref 1.7–7.7)
Neutrophils Relative %: 48 %
Platelets: 161 10*3/uL (ref 150–400)
RBC: 3.12 MIL/uL — ABNORMAL LOW (ref 4.22–5.81)
RDW: 14.6 % (ref 11.5–15.5)
WBC: 3.4 10*3/uL — ABNORMAL LOW (ref 4.0–10.5)
nRBC: 0 % (ref 0.0–0.2)

## 2021-04-15 LAB — COMPREHENSIVE METABOLIC PANEL
ALT: 28 U/L (ref 0–44)
AST: 17 U/L (ref 15–41)
Albumin: 3.3 g/dL — ABNORMAL LOW (ref 3.5–5.0)
Alkaline Phosphatase: 29 U/L — ABNORMAL LOW (ref 38–126)
Anion gap: 7 (ref 5–15)
BUN: 14 mg/dL (ref 8–23)
CO2: 24 mmol/L (ref 22–32)
Calcium: 8.5 mg/dL — ABNORMAL LOW (ref 8.9–10.3)
Chloride: 104 mmol/L (ref 98–111)
Creatinine, Ser: 1.58 mg/dL — ABNORMAL HIGH (ref 0.61–1.24)
GFR, Estimated: 46 mL/min — ABNORMAL LOW (ref >60)
Glucose, Bld: 104 mg/dL — ABNORMAL HIGH (ref 70–99)
Potassium: 4.2 mmol/L (ref 3.5–5.1)
Sodium: 135 mmol/L (ref 135–145)
Total Bilirubin: 0.5 mg/dL (ref 0.3–1.2)
Total Protein: 5.7 g/dL — ABNORMAL LOW (ref 6.5–8.1)

## 2021-04-15 MED ORDER — DARATUMUMAB-HYALURONIDASE-FIHJ 1800-30000 MG-UT/15ML ~~LOC~~ SOLN
1800.0000 mg | Freq: Once | SUBCUTANEOUS | Status: AC
  Administered 2021-04-15: 1800 mg via SUBCUTANEOUS
  Filled 2021-04-15: qty 15

## 2021-04-15 MED ORDER — ACETAMINOPHEN 325 MG PO TABS: 650.0000 mg | ORAL_TABLET | Freq: Once | ORAL | Status: DC

## 2021-04-15 MED ORDER — DEXAMETHASONE 4 MG PO TABS: 20.0000 mg | ORAL_TABLET | Freq: Once | ORAL | Status: DC

## 2021-04-15 MED ORDER — DIPHENHYDRAMINE HCL 25 MG PO CAPS: 50.0000 mg | ORAL_CAPSULE | Freq: Once | ORAL | Status: DC

## 2021-04-15 NOTE — Progress Notes (Signed)
Labs reviewed with MD, proceed with tx today, hold xgeva this week.   Pt took pre-meds at home  Treatment given per orders. Patient tolerated it well without problems. Vitals stable and discharged home from clinic ambulatory. Follow up as scheduled.

## 2021-04-15 NOTE — Progress Notes (Signed)
Johnny Lucas, Johnny Lucas [id number]   CLINIC:  Medical Oncology/Hematology  PCP:  Johnny Percy, MD [address] [place] [address] [phone]   REASON FOR VISIT:  Follow-up for relapsed multiple myeloma  PRIOR THERAPY:  1. RVD x 6 cycles from 03/01/2017 to 06/29/2017. 2. Stem cell transplant on 09/30/2017. 3. Carfilzomib and cyclophosphamide x 2 cycles from 02/14/2018 to 03/29/2018. 4. Carfilzomib x 22 cycles from 04/20/2018 to 01/23/2020.  NGS Results: Not done  CURRENT THERAPY: Darzalex Faspro monthly; Pomalyst 2 mg 3/4 weeks; Xgeva monthly  BRIEF ONCOLOGIC HISTORY:  Oncology History  Breast cancer, male ([place])  12/31/2009 Initial Biopsy   Biopsy of L breast    12/31/2009 Pathology Results   Invasive ductal carcinoma, ER/PR+, HER 2 negative   12/31/2009 Imaging   Ultrasound showing a 2.43 x 1.85 x 3 cm hypoechoic spiculated mass in the 12 o clock L breast retroareolar region   01/01/2010 -  Anti-estrogen oral therapy   Tamoxifen 20 mg daily   01/06/2010 Imaging   Bone scan abnormal uptake in the diaphysis of the R humerus, abnormal in the R third, fifth and sixth ribs, lesion also noted in the sternum.   02/03/2010 Surgery   Rod placement and fixation of R humerus by Dr. [name]   02/05/2010 - 02/18/2010 Radiation Therapy   30Gy in 10 fractions of 3 Gy per fraction to R pathologic fracture   03/11/2010 -  Chemotherapy   Denosumab monthly, now every 3 months. Started at [facility]    06/09/2016 Imaging   Three hypermetabolic osseous lesions in the sternum, left ilium and right ilium, as discussed above, likely represent osseous metastases. At this time, these are not recognizable on the CT images. 2. No extra skeletal metastatic disease identified in the neck, chest, abdomen or pelvis.   10/13/2016 Progression   PET shows various new and enlarging osseous metastatic lesions with no definite extra osseous metastatic disease currently  identified.    12/31/2016 Progression   1. Multifocal hypermetabolic osseous metastases throughout the axial and proximal appendicular skeleton, which are increased in size, number and metabolism since 10/13/2016 PET-CT. 2. 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 3. No additional sites of hypermetabolic metastatic disease. 4. Chronic right mastoid sinusitis. 5. Aortic atherosclerosis.  One vessel coronary atherosclerosis.   Multiple myeloma ([place])  02/12/2017 Bone Marrow Biopsy   The marrow was variably cellular with large peritrabecular aggregates of kappa restricted plasma cells (66% by aspirate, 30% by Cd138). Cytogenetics +11.    03/01/2017 - 06/29/2017 Chemotherapy   RVD    05/26/2017 Bone Marrow Biopsy   Performed at [facility]:  Plasma cell myeloma in a 30% cellularmarrow with decreased trilineage hematopoiesis and 42% kappalight chain restricted plasma cells on the aspirate smears andlarge aggregates on the core biopsy.    07/12/2017 - 09/01/2017 Chemotherapy   3 cycles of carfizolmib/cyclophosphamide/dexamethasone     10/08/2017 Bone Marrow Transplant   Autotransplant at [facility]   02/28/2020 -  Chemotherapy    Patient is on Treatment Plan: DARATUMUMAB Q14D      Multiple myeloma not having achieved remission ([place])  06/29/2017 Initial Diagnosis   Multiple myeloma not having achieved remission ([place])   02/28/2020 -  Chemotherapy    Patient is on Treatment Plan: DARATUMUMAB Q14D        CANCER STAGING: Cancer  Staging Multiple myeloma ([place]) Staging form: Plasma Cell Myeloma and Plasma Cell Disorders, AJCC 8th Edition - Clinical stage from 05/03/2017: Beta-2-microglobulin (mg/L): 3.5, Albumin (g/dL): 3.4, ISS: Stage II, High-risk cytogenetics: Absent, LDH: Not assessed - Signed by Baird Cancer, PA-C on 05/03/2017   INTERVAL  HISTORY:  Johnny Lucas, a 75 y.o. male, returns for routine follow-up and consideration for next cycle of immunotherapy. Johnny Lucas was last seen on 02/18/2021.  Due for cycle #22 of Darzalex Faspro today.   Overall, he tells me he has been feeling okay. He reports having an episode of bronchitis with nasal discharge 2 week ago, COVID negative, and went to his PCP who prescribed amoxicillin and prednisone; his symptoms have resolved since finishing his treatment on 04/15. He tolerated the previous treatment well. He continues taking Decadron 20 mg weekly. His energy levels are improving. He has not had any more episodes of cramping in his arms; he continues taking gabapentin.  He is wondering if he can get his right cataract extracted in the near future.  Overall, he feels ready for next cycle of immunotherapy today.    REVIEW OF SYSTEMS:  Review of Systems  Constitutional: Positive for fatigue (75%). Negative for appetite change.  Respiratory: Positive for wheezing. Negative for cough.   Gastrointestinal: Positive for diarrhea.  Musculoskeletal: Negative for myalgias.  Neurological: Positive for headaches (occasional) and numbness (tingling in feet).  All other systems reviewed and are negative.   PAST MEDICAL/SURGICAL HISTORY:  Past Medical History:  Diagnosis Date  . Anxiety   . Bone metastases ([place]) 09/10/2016  . Breast cancer ([place]) 2011   Stage IV breast cancer; radiation and tamoxifen  . Breast cancer, male ([place])    Stage IV breast cancer; radiation and tamoxifen  Overview:  Left breast ca with mets bone  Overview:  METS TO BONE  . GERD (gastroesophageal reflux disease)   . Hypertension   . Macular degeneration   . Multiple myeloma ([place]) 02/18/2017  . Peripheral neuropathy    Past Surgical History:  Procedure Laterality Date  . BACK SURGERY    . CATARACT EXTRACTION W/PHACO Left 02/03/2019   Procedure: CATARACT EXTRACTION PHACO AND INTRAOCULAR LENS PLACEMENT ([place]);   Surgeon: Baruch Goldmann, MD;  Location: [facility];  Service: Ophthalmology;  Laterality: Left;  CDE: 15.89  . HERNIA REPAIR    . PORTACATH PLACEMENT Left 06/10/2018   Procedure: INSERTION PORT-A-CATH;  Surgeon: Virl Cagey, MD;  Location: [facility];  Service: General;  Laterality: Left;  . right arm surgery      SOCIAL HISTORY:  Social History   Socioeconomic History  . Marital status: Married    Spouse name: Not on file  . Number of children: 3  . Years of education: Not on file  . Highest education level: Not on file  Occupational History  . Not on file  Tobacco Use  . Smoking status: Never Smoker  . Smokeless tobacco: Former Network engineer and Sexual Activity  . Alcohol use: Yes    Alcohol/week: 24.0 standard drinks    Types: 24 Cans of beer per week  . Drug use: No  . Sexual activity: Not on file  Other Topics Concern  . Not on file  Social History Narrative  . Not on file   Social Determinants of Health   Financial Resource Strain: Low Risk   . Difficulty of Paying Living Expenses: Not hard at all  Food Insecurity: No Food Insecurity  . Worried About Running  Out of Food in the Last Year: Never true  . Ran Out of Food in the Last Year: Never true  Transportation Needs: No Transportation Needs  . Lack of Transportation (Medical): No  . Lack of Transportation (Non-Medical): No  Physical Activity: Inactive  . Days of Exercise per Week: 0 days  . Minutes of Exercise per Session: 0 min  Stress: Stress Concern Present  . Feeling of Stress : To some extent  Social Connections: Moderately Integrated  . Frequency of Communication with Friends and Family: More than three times a week  . Frequency of Social Gatherings with Friends and Family: Three times a week  . Attends Religious Services: 1 to 4 times per year  . Active Member of Clubs or Organizations: No  . Attends Archivist Meetings: Never  . Marital Status: Married  Human resources officer Violence: Not At  Risk  . Fear of Current or Ex-Partner: No  . Emotionally Abused: No  . Physically Abused: No  . Sexually Abused: No    FAMILY HISTORY:  Family History  Problem Relation Age of Onset  . Stroke Mother   . Cancer Maternal Aunt        cancer NOS; died in her 57s  . Lung cancer Maternal Uncle        smoker    CURRENT MEDICATIONS:  Current Outpatient Medications  Medication Sig Dispense Refill  . acetaminophen (TYLENOL) 500 MG tablet Take 500 mg by mouth every 6 (six) hours as needed.    Marland Kitchen acyclovir (ZOVIRAX) 800 MG tablet TAKE 1 TABLET TWICE DAILY 180 tablet 3  . ALPRAZolam (XANAX) 0.5 MG tablet 1 tab PO QHS PRN anxiety/sleep 30 tablet 5  . aspirin EC 81 MG tablet Take 81 mg by mouth daily.     Marland Kitchen augmented betamethasone dipropionate (DIPROLENE-AF) 0.05 % cream Apply topically as needed. 30 g 6  . calcium-vitamin D (OSCAL WITH D) 500-200 MG-UNIT TABS tablet TAKE TWO TABLETS BY MOUTH DAILY 60 tablet 6  . Cholecalciferol (VITAMIN D) 50 MCG (2000 UT) CAPS Take 4,000 Units by mouth 2 (two) times daily.     Marland Kitchen DARATUMUMAB-HYALURONIDASE-FIHJ [place] Inject 1,800 mg into the skin every 14 (fourteen) days.     Marland Kitchen denosumab (XGEVA) 120 MG/1.7ML SOLN injection Inject 120 mg into the skin every 30 (thirty) days.     Marland Kitchen dexamethasone (DECADRON) 4 MG tablet Take 5 pills weekly. 40 tablet 5  . diclofenac Sodium (VOLTAREN) 1 % GEL Use 1 to 2 times per day as directed by doctor    . folic acid (FOLVITE) 1 MG tablet TAKE 1 TABLET BY MOUTH DAILY 90 tablet 2  . gabapentin (NEURONTIN) 300 MG capsule Take 1 capsule (300 mg total) by mouth 3 (three) times daily. 180 capsule 2  . hydrALAZINE (APRESOLINE) 50 MG tablet Take 1 tablet (50 mg total) by mouth every 8 (eight) hours. 90 tablet 0  . loratadine (CLARITIN) 10 MG tablet Take 1 tablet (10 mg total) by mouth daily. 90 tablet 1  . metoprolol succinate (TOPROL-XL) 100 MG 24 hr tablet Take 100 mg by mouth daily.     . Multiple Vitamin (MULTIVITAMIN WITH MINERALS)  TABS tablet Take 1 tablet by mouth daily.     . ondansetron (ZOFRAN) 8 MG tablet TAKE 1 TABLET BY MOUTH EVERY 8 HOURS AS NEEDED FOR NAUSEA AND VOMITING 30 tablet 2  . pantoprazole (PROTONIX) 40 MG tablet Take 40 mg by mouth every other day.     Marland Kitchen  polyethylene glycol (MIRALAX / GLYCOLAX) packet Take 17 g by mouth daily as needed for mild constipation. 14 each 0  . POMALYST 2 MG capsule TAKE 1 CAPSULE BY MOUTH EVERY DAY 21 capsule 0  . POMALYST 2 MG capsule TAKE 1 CAPSULE BY MOUTH DAILY FOR 21 DAYS 21 capsule 0  . prochlorperazine (COMPAZINE) 10 MG tablet Take 1 tablet (10 mg total) by mouth every 6 (six) hours as needed for nausea or vomiting. 30 tablet 2  . pseudoephedrine-guaifenesin (MUCINEX D) 60-600 MG 12 hr tablet Take 1 tablet by mouth 2 (two) times daily as needed for congestion.     . tamoxifen (NOLVADEX) 20 MG tablet Take 1 tablet (20 mg total) by mouth daily. 90 tablet 3  . vitamin B-12 (CYANOCOBALAMIN) 1000 MCG tablet Take 1,000 mcg by mouth daily.      No current facility-administered medications for this visit.   Facility-Administered Medications Ordered in Other Visits  Medication Dose Route Frequency Provider Last Rate Last Admin  . ondansetron (ZOFRAN) 8 mg in sodium chloride 0.9 % 50 mL IVPB  8 mg Intravenous Once Lockamy, Randi L, NP-C      . sodium chloride flush (NS) 0.9 % injection 10 mL  10 mL Intracatheter Once PRN Johnny Jack, MD        ALLERGIES:  Allergies  Allergen Reactions  . Cefepime     Suspected severe thrombocytopenia is a result of cefepime induced antigen platelet destruction    PHYSICAL EXAM:  Performance status (ECOG): 1 - Symptomatic but completely ambulatory  Vitals:   04/15/21 1026  BP: 122/67  Pulse: 73  Resp: 18  Temp: 99.4 F (37.4 C)  SpO2: 100%   Wt Readings from Last 3 Encounters:  04/15/21 211 lb 6.4 oz (95.9 kg)  03/18/21 215 lb 3.2 oz (97.6 kg)  02/18/21 213 lb 8 oz (96.8 kg)   Physical Exam Vitals reviewed.   Constitutional:      Appearance: Normal appearance.  HENT:     Mouth/Throat:     Lips: No lesions.     Mouth: No oral lesions.     Dentition: Gum lesions (exposed bone) present.     Tongue: No lesions.  Cardiovascular:     Rate and Rhythm: Normal rate and regular rhythm.     Pulses: Normal pulses.     Heart sounds: Normal heart sounds.  Pulmonary:     Effort: Pulmonary effort is normal.     Breath sounds: Normal breath sounds.  Chest:     Comments: Port-a-Cath in L chest Abdominal:     Palpations: Abdomen is soft. There is no hepatomegaly, splenomegaly or mass.     Tenderness: There is no abdominal tenderness.  Musculoskeletal:     Right lower leg: No edema.     Left lower leg: No edema.  Neurological:     General: No focal deficit present.     Mental Status: He is alert and oriented to person, place, and time.  Psychiatric:        Mood and Affect: Mood normal.        Behavior: Behavior normal.     LABORATORY DATA:  I have reviewed the labs as listed.  CBC Latest Ref Rng & Units 04/15/2021 02/18/2021 01/14/2021  WBC 4.0 - 10.5 K/uL 3.4(L) 4.7 10.3  Hemoglobin 13.0 - 17.0 g/dL 11.1(L) 11.1(L) 12.1(L)  Hematocrit 39.0 - 52.0 % 33.9(L) 34.2(L) 37.1(L)  Platelets 150 - 400 K/uL 161 150 237   CMP Latest Ref  Rng & Units 04/15/2021 02/18/2021 01/14/2021  Glucose 70 - 99 mg/dL 104(H) 99 122(H)  BUN 8 - 23 mg/dL $Remove'14 17 21  'hlfhJzQ$ Creatinine 0.61 - 1.24 mg/dL 1.58(H) 1.63(H) 1.57(H)  Sodium 135 - 145 mmol/L 135 136 134(L)  Potassium 3.5 - 5.1 mmol/L 4.2 4.1 4.4  Chloride 98 - 111 mmol/L 104 105 102  CO2 22 - 32 mmol/L 24 25 21(L)  Calcium 8.9 - 10.3 mg/dL 8.5(L) 8.7(L) 8.5(L)  Total Protein 6.5 - 8.1 g/dL 5.7(L) 5.6(L) 6.2(L)  Total Bilirubin 0.3 - 1.2 mg/dL 0.5 0.6 0.5  Alkaline Phos 38 - 126 U/L 29(L) 26(L) 30(L)  AST 15 - 41 U/L $Remo'17 16 16  'vNhPC$ ALT 0 - 44 U/L $Remo'28 15 16    'qKXOK$ DIAGNOSTIC IMAGING:  I have independently reviewed the scans and discussed with the patient. No results found.    ASSESSMENT:  1. Relapsed multiple myeloma: -Autologous stem cell transplant on 10/08/2017 -Post transplant consolidation withCyCarD for 2 cycles with M spike undetectable. -Maintenance therapy with carfilzomib 70 mg per metered square days 1 and 15 every 28 days from 04/20/2018, dose reduced to 35 mg per metered square on 01/05/2019, titrated up to 56 mg per metered square on 01/09/2020. 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 -PET scan on 02/18/2020 showed new left supraclavicular lymph node at the thoracic inlet, SUV 14.2. Multifocal osseous lesions largely improved, though residual lesions noted including right acromion, left acromion, thyroid cartilage, right sternum. Additional lesions throughout the sternum, bilateral ribs, thoracolumbar spine, bilateral pelvis have resolved. -BMBX on 02/14/2020 shows plasma cell myeloma, 60-70% of cells. FISH for myeloma was negative. Cytogenetics was normal. -4 cycles of daratumumab, bortezomib and dexamethasone from 02/28/2020 through 04/29/2020. -Daratumumab, pomalidomide and dexamethasone started on 05/28/2020. He is receiving daratumumab every 2 weeks, Pomalyst 3 mg days 1-21, dexamethasone 20 mg weekly. -Pomalidomide dose reduced to 2 mg days 1-21 around the first week of July 2021, due to severe weakness.   PLAN:  1. Relapsed IgG kappa multiple myeloma: -Continue pomalidomide 2 mg 3 weeks on/1 week off.  Continue dexamethasone 20 mg weekly. - He is tolerating monthly Darzalex reasonably well. - Multiple myeloma panel today showed M spike of 0.1 g.  Free light chain ratio is normal.  Immunofixation shows IgG kappa. - Continue current treatment with monthly Darzalex.  CBC showed white count 3.4 with ANC 1.6.  LFTs are grossly normal. - RTC 8 weeks for follow-up.  2. Myeloma bone disease: -He had dental extraction recently. - We will hold Xgeva.  3. Macrocytic anemia: -Hemoglobin staying  between 11 and 12.  No growth factor needed unless hemoglobin drops below 9.  4. ID prophylaxis: -Continue acyclovir twice daily.  5. Peripheral neuropathy: -Continue gabapentin 300 mg 3 times daily.  6. History of breast cancer: -Continue tamoxifen daily.   Orders placed this encounter:  Orders Placed This Encounter  Procedures  . Lactate dehydrogenase  . Protein electrophoresis, serum  . Kappa/lambda light chains     Johnny Jack, MD [facility] [phone]   I, Milinda Antis, am acting as a scribe for Dr. Sanda Linger.  I, Johnny Jack MD, have reviewed the above documentation for accuracy and completeness, and I agree with the above.

## 2021-04-15 NOTE — Patient Instructions (Signed)
[facility] Discharge Instructions for Patients Receiving Chemotherapy  Today you received the following chemotherapy agents   To help prevent nausea and vomiting after your treatment, we encourage you to take your nausea medication   If you develop nausea and vomiting that is not controlled by your nausea medication, call the clinic.   BELOW ARE SYMPTOMS THAT SHOULD BE REPORTED IMMEDIATELY:  *FEVER GREATER THAN 100.5 F  *CHILLS WITH OR WITHOUT FEVER  NAUSEA AND VOMITING THAT IS NOT CONTROLLED WITH YOUR NAUSEA MEDICATION  *UNUSUAL SHORTNESS OF BREATH  *UNUSUAL BRUISING OR BLEEDING  TENDERNESS IN MOUTH AND THROAT WITH OR WITHOUT PRESENCE OF ULCERS  *URINARY PROBLEMS  *BOWEL PROBLEMS  UNUSUAL RASH Items with * indicate a potential emergency and should be followed up as soon as possible.  Feel free to call the clinic should you have any questions or concerns. The clinic phone number is [phone].  Please show the CHEMO ALERT CARD at check-in to the Emergency Department and triage nurse.   

## 2021-04-15 NOTE — Patient Instructions (Addendum)
[place] at [facility] Discharge Instructions  You were seen today by Dr. Delton Coombes. He went over your recent results. You received your treatment today; continue getting your treatment every month. You may proceed with getting your cataract surgery. Dr. Delton Coombes will see you back in 2 months for labs and follow up.   Thank you for choosing [place] at [facility] to provide your oncology and hematology care.  To afford each patient quality time with our provider, please arrive at least 15 minutes before your scheduled appointment time.   If you have a lab appointment with the [facility] please come in thru the Main Entrance and check in at the main information desk  You need to re-schedule your appointment should you arrive 10 or more minutes late.  We strive to give you quality time with our providers, and arriving late affects you and other patients whose appointments are after yours.  Also, if you no show three or more times for appointments you may be dismissed from the clinic at the providers discretion.     Again, thank you for choosing [facility].  Our hope is that these requests will decrease the amount of time that you wait before being seen by our physicians.       _____________________________________________________________  Should you have questions after your visit to [facility], please contact our office at [phone] between the hours of 8:00 a.m. and 4:30 p.m.  Voicemails left after 4:00 p.m. will not be returned until the following business day.  For prescription refill requests, have your pharmacy contact our office and allow 72 hours.    Cancer Center Support Programs:   > Cancer Support Group  2nd Tuesday of the month 1pm-2pm, Journey Room

## 2021-04-16 LAB — IMMUNOFIXATION ELECTROPHORESIS
IgA: 5 mg/dL — ABNORMAL LOW (ref 61–437)
IgG (Immunoglobin G), Serum: 140 mg/dL — ABNORMAL LOW (ref 603–1613)
IgM (Immunoglobulin M), Srm: 6 mg/dL — ABNORMAL LOW (ref 15–143)
Total Protein ELP: 4.9 g/dL — ABNORMAL LOW (ref 6.0–8.5)

## 2021-04-16 LAB — KAPPA/LAMBDA LIGHT CHAINS
Kappa free light chain: 1.7 mg/L — ABNORMAL LOW (ref 3.3–19.4)
Kappa, lambda light chain ratio: 0.74 (ref 0.26–1.65)
Lambda free light chains: 2.3 mg/L — ABNORMAL LOW (ref 5.7–26.3)

## 2021-04-17 LAB — PROTEIN ELECTROPHORESIS, SERUM
A/G Ratio: 1.4 (ref 0.7–1.7)
Albumin ELP: 3 g/dL (ref 2.9–4.4)
Alpha-1-Globulin: 0.4 g/dL (ref 0.0–0.4)
Alpha-2-Globulin: 0.8 g/dL (ref 0.4–1.0)
Beta Globulin: 0.8 g/dL (ref 0.7–1.3)
Gamma Globulin: 0.1 g/dL — ABNORMAL LOW (ref 0.4–1.8)
Globulin, Total: 2.1 g/dL — ABNORMAL LOW (ref 2.2–3.9)
M-Spike, %: 0.1 g/dL — ABNORMAL HIGH
Total Protein ELP: 5.1 g/dL — ABNORMAL LOW (ref 6.0–8.5)

## 2021-04-26 ENCOUNTER — Other Ambulatory Visit (HOSPITAL_COMMUNITY): Payer: Self-pay | Admitting: Hematology

## 2021-04-26 DIAGNOSIS — I10 Essential (primary) hypertension: Secondary | ICD-10-CM

## 2021-04-26 DIAGNOSIS — G4700 Insomnia, unspecified: Secondary | ICD-10-CM

## 2021-04-28 DIAGNOSIS — H01001 Unspecified blepharitis right upper eyelid: Secondary | ICD-10-CM | POA: Diagnosis not present

## 2021-04-28 DIAGNOSIS — H35713 Central serous chorioretinopathy, bilateral: Secondary | ICD-10-CM | POA: Diagnosis not present

## 2021-04-28 DIAGNOSIS — Z961 Presence of intraocular lens: Secondary | ICD-10-CM | POA: Diagnosis not present

## 2021-04-28 DIAGNOSIS — H25811 Combined forms of age-related cataract, right eye: Secondary | ICD-10-CM | POA: Diagnosis not present

## 2021-04-30 NOTE — Telephone Encounter (Signed)
Chart reviewed. Pomalyst refilled per Dr. Delton Coombes

## 2021-05-13 ENCOUNTER — Inpatient Hospital Stay (HOSPITAL_COMMUNITY): Payer: Medicare Other | Attending: Hematology

## 2021-05-13 ENCOUNTER — Other Ambulatory Visit: Payer: Self-pay

## 2021-05-13 ENCOUNTER — Encounter (HOSPITAL_COMMUNITY): Payer: Self-pay

## 2021-05-13 ENCOUNTER — Inpatient Hospital Stay (HOSPITAL_COMMUNITY): Payer: Medicare Other

## 2021-05-13 VITALS — BP 139/63 | HR 58 | Temp 97.0°F | Resp 18 | Wt 212.6 lb

## 2021-05-13 DIAGNOSIS — Z5111 Encounter for antineoplastic chemotherapy: Secondary | ICD-10-CM | POA: Insufficient documentation

## 2021-05-13 DIAGNOSIS — C9000 Multiple myeloma not having achieved remission: Secondary | ICD-10-CM | POA: Insufficient documentation

## 2021-05-13 LAB — CBC WITH DIFFERENTIAL/PLATELET
Abs Immature Granulocytes: 0.01 10*3/uL (ref 0.00–0.07)
Basophils Absolute: 0.1 10*3/uL (ref 0.0–0.1)
Basophils Relative: 3 %
Eosinophils Absolute: 0.6 10*3/uL — ABNORMAL HIGH (ref 0.0–0.5)
Eosinophils Relative: 14 %
HCT: 33.8 % — ABNORMAL LOW (ref 39.0–52.0)
Hemoglobin: 11 g/dL — ABNORMAL LOW (ref 13.0–17.0)
Immature Granulocytes: 0 %
Lymphocytes Relative: 18 %
Lymphs Abs: 0.7 10*3/uL (ref 0.7–4.0)
MCH: 35.7 pg — ABNORMAL HIGH (ref 26.0–34.0)
MCHC: 32.5 g/dL (ref 30.0–36.0)
MCV: 109.7 fL — ABNORMAL HIGH (ref 80.0–100.0)
Monocytes Absolute: 1 10*3/uL (ref 0.1–1.0)
Monocytes Relative: 23 %
Neutro Abs: 1.8 10*3/uL (ref 1.7–7.7)
Neutrophils Relative %: 42 %
Platelets: 158 10*3/uL (ref 150–400)
RBC: 3.08 MIL/uL — ABNORMAL LOW (ref 4.22–5.81)
RDW: 15.9 % — ABNORMAL HIGH (ref 11.5–15.5)
WBC: 4.2 10*3/uL (ref 4.0–10.5)
nRBC: 0 % (ref 0.0–0.2)

## 2021-05-13 LAB — COMPREHENSIVE METABOLIC PANEL
ALT: 15 U/L (ref 0–44)
AST: 16 U/L (ref 15–41)
Albumin: 3.3 g/dL — ABNORMAL LOW (ref 3.5–5.0)
Alkaline Phosphatase: 28 U/L — ABNORMAL LOW (ref 38–126)
Anion gap: 6 (ref 5–15)
BUN: 14 mg/dL (ref 8–23)
CO2: 27 mmol/L (ref 22–32)
Calcium: 8.4 mg/dL — ABNORMAL LOW (ref 8.9–10.3)
Chloride: 105 mmol/L (ref 98–111)
Creatinine, Ser: 1.32 mg/dL — ABNORMAL HIGH (ref 0.61–1.24)
GFR, Estimated: 57 mL/min — ABNORMAL LOW (ref >60)
Glucose, Bld: 83 mg/dL (ref 70–99)
Potassium: 4.8 mmol/L (ref 3.5–5.1)
Sodium: 138 mmol/L (ref 135–145)
Total Bilirubin: 0.6 mg/dL (ref 0.3–1.2)
Total Protein: 5.3 g/dL — ABNORMAL LOW (ref 6.5–8.1)

## 2021-05-13 LAB — MAGNESIUM: Magnesium: 2 mg/dL (ref 1.7–2.4)

## 2021-05-13 MED ORDER — ACETAMINOPHEN 325 MG PO TABS: 650.0000 mg | ORAL_TABLET | Freq: Once | ORAL | Status: DC

## 2021-05-13 MED ORDER — DIPHENHYDRAMINE HCL 25 MG PO CAPS: 50.0000 mg | ORAL_CAPSULE | Freq: Once | ORAL | Status: DC

## 2021-05-13 MED ORDER — DARATUMUMAB-HYALURONIDASE-FIHJ 1800-30000 MG-UT/15ML ~~LOC~~ SOLN
1800.0000 mg | Freq: Once | SUBCUTANEOUS | Status: AC
  Administered 2021-05-13: 1800 mg via SUBCUTANEOUS
  Filled 2021-05-13: qty 15

## 2021-05-13 MED ORDER — DEXAMETHASONE 4 MG PO TABS: 20.0000 mg | ORAL_TABLET | Freq: Once | ORAL | Status: DC

## 2021-05-13 NOTE — Patient Instructions (Signed)
[facility]  Discharge Instructions: ?Thank you for choosing [facility] to provide your oncology and hematology care.  ?If you have a lab appointment with the Cancer Center, please come in thru the Main Entrance and check in at the main information desk. ? ?Wear comfortable clothing and clothing appropriate for easy access to any Portacath or PICC line.  ? ?We strive to give you quality time with your provider. You may need to reschedule your appointment if you arrive late (15 or more minutes).  Arriving late affects you and other patients whose appointments are after yours.  Also, if you miss three or more appointments without notifying the office, you may be dismissed from the clinic at the provider?s discretion.    ?  ?For prescription refill requests, have your pharmacy contact our office and allow 72 hours for refills to be completed.   ? ?Today you received the following chemotherapy and/or immunotherapy agents Darzalex Faspro [place]    ?  ?To help prevent nausea and vomiting after your treatment, we encourage you to take your nausea medication as directed. ? ?BELOW ARE SYMPTOMS THAT SHOULD BE REPORTED IMMEDIATELY: ?*FEVER GREATER THAN 100.4 F (38 ?C) OR HIGHER ?*CHILLS OR SWEATING ?*NAUSEA AND VOMITING THAT IS NOT CONTROLLED WITH YOUR NAUSEA MEDICATION ?*UNUSUAL SHORTNESS OF BREATH ?*UNUSUAL BRUISING OR BLEEDING ?*URINARY PROBLEMS (pain or burning when urinating, or frequent urination) ?*BOWEL PROBLEMS (unusual diarrhea, constipation, pain near the anus) ?TENDERNESS IN MOUTH AND THROAT WITH OR WITHOUT PRESENCE OF ULCERS (sore throat, sores in mouth, or a toothache) ?UNUSUAL RASH, SWELLING OR PAIN  ?UNUSUAL VAGINAL DISCHARGE OR ITCHING  ? ?Items with * indicate a potential emergency and should be followed up as soon as possible or go to the Emergency Department if any problems should occur. ? ?Please show the CHEMOTHERAPY ALERT CARD or IMMUNOTHERAPY ALERT CARD at check-in to the  Emergency Department and triage nurse. ? ?Should you have questions after your visit or need to cancel or reschedule your appointment, please contact [facility] [phone]  and follow the prompts.  Office hours are 8:00 a.m. to 4:30 p.m. Monday - Friday. Please note that voicemails left after 4:00 p.m. may not be returned until the following business day.  We are closed weekends and major holidays. You have access to a nurse at all times for urgent questions. Please call the main number to the clinic [phone] and follow the prompts. ? ?For any non-urgent questions, you may also contact your provider using MyChart. We now offer e-Visits for anyone 18 and older to request care online for non-urgent symptoms. For details visit [email or web address]. ?  ?Also download the MyChart app! Go to the app store, search "MyChart", open the app, select [facility], and log in with your MyChart username and password. ? ?Due to Covid, a mask is required upon entering the hospital/clinic. If you do not have a mask, one will be given to you upon arrival. For doctor visits, patients may have 1 support person aged 18 or older with them. For treatment visits, patients cannot have anyone with them due to current Covid guidelines and our immunocompromised population.  ?

## 2021-05-13 NOTE — Progress Notes (Signed)
Patient presents today for treatment. Labs within parameters for treatment. Vital signs within parameters for treatment. Patient denies pain today. Patient has no complaints. Patient states he has experienced cramping in his hands that is not new but more frequent per patient's words. Per Dr. Burna Cash RN patient may receive XGeva injection at this time. Completed dental work performed greater than 6 weeks ago. Scheduling notified. Calcium 8.4 today.   Treatment given today per MD orders. Tolerated without adverse affects. Vital signs stable. No complaints at this time. Discharged from clinic ambulatory in stable condition. Alert and oriented x 3. F/U with [facility] as scheduled.

## 2021-05-14 ENCOUNTER — Inpatient Hospital Stay (HOSPITAL_COMMUNITY): Payer: Medicare Other

## 2021-05-14 ENCOUNTER — Encounter (HOSPITAL_COMMUNITY): Payer: Self-pay

## 2021-05-14 VITALS — BP 129/51 | HR 67 | Temp 97.4°F | Resp 18

## 2021-05-14 DIAGNOSIS — Z5111 Encounter for antineoplastic chemotherapy: Secondary | ICD-10-CM | POA: Diagnosis not present

## 2021-05-14 DIAGNOSIS — C9000 Multiple myeloma not having achieved remission: Secondary | ICD-10-CM

## 2021-05-14 DIAGNOSIS — C7951 Secondary malignant neoplasm of bone: Secondary | ICD-10-CM

## 2021-05-14 MED ORDER — DENOSUMAB 120 MG/1.7ML ~~LOC~~ SOLN
120.0000 mg | Freq: Once | SUBCUTANEOUS | Status: AC
  Administered 2021-05-14: 120 mg via SUBCUTANEOUS

## 2021-05-14 NOTE — Progress Notes (Signed)
Johnny Lucas presents today for injection per the provider's orders.  X-geva in right arm administration without incident; injection site WNL; see MAR for injection details.  Patient tolerated procedure well and without incident.  No questions or complaints noted at this time. Stable during and after injection.  Vital signs stable.  Discharged in stable condition ambulatory.  Calcium 8.4 from labs on 05/13/21.  Pt taking calcium with vitamin d.

## 2021-05-14 NOTE — Patient Instructions (Signed)
[facility]  Discharge Instructions: Thank you for choosing [facility] to provide your oncology and hematology care.  If you have a lab appointment with the [facility], please come in thru the Main Entrance and check in at the main information desk.  Wear comfortable clothing and clothing appropriate for easy access to any Portacath or PICC line.   We strive to give you quality time with your provider. You may need to reschedule your appointment if you arrive late (15 or more minutes).  Arriving late affects you and other patients whose appointments are after yours.  Also, if you miss three or more appointments without notifying the office, you may be dismissed from the clinic at the provider's discretion.      For prescription refill requests, have your pharmacy contact our office and allow 72 hours for refills to be completed.    X-geva today.      To help prevent nausea and vomiting after your treatment, we encourage you to take your nausea medication as directed.  BELOW ARE SYMPTOMS THAT SHOULD BE REPORTED IMMEDIATELY: . *FEVER GREATER THAN 100.4 F (38 C) OR HIGHER . *CHILLS OR SWEATING . *NAUSEA AND VOMITING THAT IS NOT CONTROLLED WITH YOUR NAUSEA MEDICATION . *UNUSUAL SHORTNESS OF BREATH . *UNUSUAL BRUISING OR BLEEDING . *URINARY PROBLEMS (pain or burning when urinating, or frequent urination) . *BOWEL PROBLEMS (unusual diarrhea, constipation, pain near the anus) . TENDERNESS IN MOUTH AND THROAT WITH OR WITHOUT PRESENCE OF ULCERS (sore throat, sores in mouth, or a toothache) . UNUSUAL RASH, SWELLING OR PAIN  . UNUSUAL VAGINAL DISCHARGE OR ITCHING   Items with * indicate a potential emergency and should be followed up as soon as possible or go to the Emergency Department if any problems should occur.  Please show the CHEMOTHERAPY ALERT CARD or IMMUNOTHERAPY ALERT CARD at check-in to the Emergency Department and triage nurse.  Should you have  questions after your visit or need to cancel or reschedule your appointment, please contact [facility] [phone]  and follow the prompts.  Office hours are 8:00 a.m. to 4:30 p.m. Monday - Friday. Please note that voicemails left after 4:00 p.m. may not be returned until the following business day.  We are closed weekends and major holidays. You have access to a nurse at all times for urgent questions. Please call the main number to the clinic [phone] and follow the prompts.  For any non-urgent questions, you may also contact your provider using MyChart. We now offer e-Visits for anyone 94 and older to request care online for non-urgent symptoms. For details visit [email or web address].   Also download the MyChart app! Go to the app store, search "MyChart", open the app, select [place], and log in with your MyChart username and password.  Due to Covid, a mask is required upon entering the hospital/clinic. If you do not have a mask, one will be given to you upon arrival. For doctor visits, patients may have 1 support person aged 58 or older with them. For treatment visits, patients cannot have anyone with them due to current Covid guidelines and our immunocompromised population.

## 2021-05-22 ENCOUNTER — Other Ambulatory Visit (HOSPITAL_COMMUNITY): Payer: Self-pay | Admitting: Hematology

## 2021-05-22 DIAGNOSIS — G4700 Insomnia, unspecified: Secondary | ICD-10-CM

## 2021-05-22 DIAGNOSIS — I10 Essential (primary) hypertension: Secondary | ICD-10-CM

## 2021-05-27 ENCOUNTER — Encounter (HOSPITAL_COMMUNITY): Payer: Self-pay | Admitting: Hematology

## 2021-05-27 NOTE — Telephone Encounter (Signed)
Chart reviewed. Pomalyst refilled per Dr. Delton Coombes

## 2021-05-27 NOTE — H&P (Signed)
Surgical History & Physical  Patient Name: Johnny Lucas DOB: [birth year]  Surgery: Cataract extraction with intraocular lens implant phacoemulsification; Right Eye  Surgeon: Baruch Goldmann MD Surgery Date:  06/02/2021 Pre-Op Date:  05/27/2021  HPI: A 31 Yr. old male patient Hx of left cataract sx here , pt is self referred for cataract sx in right eye. 1. The patient complains of difficulty when viewing TV, reading closed caption, news scrolls on TV, which began many years ago. The right eye is affected. The episode is gradual. The condition's severity increased since last visit. Symptoms occur when the patient is inside and outside. This is negataively affecting his quality of life. HPI was performed by Baruch Goldmann .  Medical History: Cataracts Macula Degeneration Central Serous Chorioretinopathy (laser tx) Cancer High Blood Pressure Multiple Myeloma In Preventive viral infection me...  Review of Systems Allergic/Immunologic Seasonal Allergies All recorded systems are negative except as noted above.  Social   Never smoked   Medication Gati-brom-pred,  Acyclovir, Albuterol, B-12, Alprazolam, Aspirin, Calcium, Folic acid, Gabapentin, Loratadine, Metoprolol, Multivitamin, Pantoprazole, Pseudoephedrine, Tamoxifen, Amlodipine, Carfilzomib, Denosumab, Dexamethasone, Folic acid, Pomalyst,   Sx/Procedures Phaco c IOL,  Tonsils, Hernia, Neck, Pin in Right Arm,   Drug Allergies   NKDA  History & Physical: Heent:  Cataract, Right eye NECK: supple without bruits LUNGS: lungs clear to auscultation CV: regular rate and rhythm Abdomen: soft and non-tender  Impression & Plan: Assessment: 1.  COMBINED FORMS AGE RELATED CATARACT; Right Eye (H25.811) 2.  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  INTRAOCULAR LENS IOL ; Left Eye (Z96.1) 4.  BLEPHARITIS; Right Upper Lid, Right Lower Lid, Left Upper Lid, Left Lower Lid (H01.001, H01.002,H01.004,H01.005) 5.   CONJUNCTIVOCHALASIS; Both Eyes (H11.823) 6.  DERMATOCHALASIS; Right Upper Lid, Left Upper Lid (H02.831, X43.568)  Plan: 1.  Cataract accounts for the patient's decreased vision. This visual impairment is not correctable with a tolerable change in glasses or contact lenses. Cataract surgery with an implantation of a new lens should significantly improve the visual and functional status of the patient. Discussed all risks, benefits, alternatives, and potential complications. Discussed the procedures and recovery. Patient desires to have surgery. A-scan ordered and performed today for intra-ocular lens calculations. The surgery will be performed in order to improve vision for driving, reading, and for eye examinations. Recommend phacoemulsification with intra-ocular lens. Recommend Dextenza for post-operative pain and inflammation. Right Eye. Surgery required to correct imbalance of vision. Dilates well - shugarcaine by protocol. 2.  Significant scarring after laser therapy. No signs of any new fluid or hemorrhage on exam today. OCT macula is stable. 3.  Stable. Clear posterior capsule. Call with any worsening vision, pain, or any other concerns. 4.  Recommend regular lid cleaning. 5.  Asymptomatic. 6.  Asymptomatic, recommend observation for now. Findings, prognosis and treatment options reviewed.

## 2021-05-28 ENCOUNTER — Encounter (HOSPITAL_COMMUNITY): Payer: Self-pay

## 2021-05-28 ENCOUNTER — Encounter (HOSPITAL_COMMUNITY)
Admission: RE | Admit: 2021-05-28 | Discharge: 2021-05-28 | Disposition: A | Discharge status: Home / Self Care | Payer: Medicare Other | Source: Ambulatory Visit | Attending: Ophthalmology | Admitting: Ophthalmology

## 2021-05-28 ENCOUNTER — Other Ambulatory Visit: Payer: Self-pay

## 2021-05-30 ENCOUNTER — Other Ambulatory Visit (HOSPITAL_COMMUNITY): Payer: Medicare Other

## 2021-06-02 NOTE — OR Nursing (Signed)
Procedure cancelled ,  Dr. Janyth Pupa sick.  Patient notified

## 2021-06-10 ENCOUNTER — Inpatient Hospital Stay (HOSPITAL_COMMUNITY): Payer: Medicare Other

## 2021-06-10 ENCOUNTER — Other Ambulatory Visit: Payer: Self-pay

## 2021-06-10 ENCOUNTER — Inpatient Hospital Stay (HOSPITAL_BASED_OUTPATIENT_CLINIC_OR_DEPARTMENT_OTHER): Payer: Medicare Other | Admitting: Hematology

## 2021-06-10 ENCOUNTER — Inpatient Hospital Stay (HOSPITAL_COMMUNITY): Payer: Medicare Other | Attending: Hematology

## 2021-06-10 VITALS — BP 140/70 | HR 54 | Temp 97.0°F | Resp 18 | Wt 216.8 lb

## 2021-06-10 DIAGNOSIS — C9002 Multiple myeloma in relapse: Secondary | ICD-10-CM | POA: Diagnosis not present

## 2021-06-10 DIAGNOSIS — R5383 Other fatigue: Secondary | ICD-10-CM | POA: Insufficient documentation

## 2021-06-10 DIAGNOSIS — C50922 Malignant neoplasm of unspecified site of left male breast: Secondary | ICD-10-CM | POA: Insufficient documentation

## 2021-06-10 DIAGNOSIS — Z79810 Long term (current) use of selective estrogen receptor modulators (SERMs): Secondary | ICD-10-CM | POA: Insufficient documentation

## 2021-06-10 DIAGNOSIS — C9000 Multiple myeloma not having achieved remission: Secondary | ICD-10-CM | POA: Diagnosis not present

## 2021-06-10 DIAGNOSIS — Z923 Personal history of irradiation: Secondary | ICD-10-CM | POA: Insufficient documentation

## 2021-06-10 DIAGNOSIS — I10 Essential (primary) hypertension: Secondary | ICD-10-CM | POA: Diagnosis not present

## 2021-06-10 DIAGNOSIS — K219 Gastro-esophageal reflux disease without esophagitis: Secondary | ICD-10-CM | POA: Insufficient documentation

## 2021-06-10 DIAGNOSIS — G629 Polyneuropathy, unspecified: Secondary | ICD-10-CM | POA: Diagnosis not present

## 2021-06-10 DIAGNOSIS — Z170 Estrogen receptor positive status [ER+]: Secondary | ICD-10-CM | POA: Insufficient documentation

## 2021-06-10 DIAGNOSIS — Z5111 Encounter for antineoplastic chemotherapy: Secondary | ICD-10-CM | POA: Insufficient documentation

## 2021-06-10 DIAGNOSIS — C7951 Secondary malignant neoplasm of bone: Secondary | ICD-10-CM | POA: Diagnosis not present

## 2021-06-10 DIAGNOSIS — Z801 Family history of malignant neoplasm of trachea, bronchus and lung: Secondary | ICD-10-CM | POA: Insufficient documentation

## 2021-06-10 LAB — COMPREHENSIVE METABOLIC PANEL
ALT: 16 U/L (ref 0–44)
AST: 15 U/L (ref 15–41)
Albumin: 3.2 g/dL — ABNORMAL LOW (ref 3.5–5.0)
Alkaline Phosphatase: 29 U/L — ABNORMAL LOW (ref 38–126)
Anion gap: 6 (ref 5–15)
BUN: 15 mg/dL (ref 8–23)
CO2: 24 mmol/L (ref 22–32)
Calcium: 8.1 mg/dL — ABNORMAL LOW (ref 8.9–10.3)
Chloride: 107 mmol/L (ref 98–111)
Creatinine, Ser: 1.29 mg/dL — ABNORMAL HIGH (ref 0.61–1.24)
GFR, Estimated: 59 mL/min — ABNORMAL LOW (ref >60)
Glucose, Bld: 95 mg/dL (ref 70–99)
Potassium: 4.4 mmol/L (ref 3.5–5.1)
Sodium: 137 mmol/L (ref 135–145)
Total Bilirubin: 0.4 mg/dL (ref 0.3–1.2)
Total Protein: 5 g/dL — ABNORMAL LOW (ref 6.5–8.1)

## 2021-06-10 LAB — LACTATE DEHYDROGENASE: LDH: 154 U/L (ref 98–192)

## 2021-06-10 LAB — CBC WITH DIFFERENTIAL/PLATELET
Abs Immature Granulocytes: 0.03 10*3/uL (ref 0.00–0.07)
Basophils Absolute: 0.1 10*3/uL (ref 0.0–0.1)
Basophils Relative: 3 %
Eosinophils Absolute: 0.4 10*3/uL (ref 0.0–0.5)
Eosinophils Relative: 9 %
HCT: 33 % — ABNORMAL LOW (ref 39.0–52.0)
Hemoglobin: 10.9 g/dL — ABNORMAL LOW (ref 13.0–17.0)
Immature Granulocytes: 1 %
Lymphocytes Relative: 13 %
Lymphs Abs: 0.7 10*3/uL (ref 0.7–4.0)
MCH: 36.7 pg — ABNORMAL HIGH (ref 26.0–34.0)
MCHC: 33 g/dL (ref 30.0–36.0)
MCV: 111.1 fL — ABNORMAL HIGH (ref 80.0–100.0)
Monocytes Absolute: 0.6 10*3/uL (ref 0.1–1.0)
Monocytes Relative: 13 %
Neutro Abs: 3.1 10*3/uL (ref 1.7–7.7)
Neutrophils Relative %: 61 %
Platelets: 148 10*3/uL — ABNORMAL LOW (ref 150–400)
RBC: 2.97 MIL/uL — ABNORMAL LOW (ref 4.22–5.81)
RDW: 15.9 % — ABNORMAL HIGH (ref 11.5–15.5)
WBC: 4.9 10*3/uL (ref 4.0–10.5)
nRBC: 0 % (ref 0.0–0.2)

## 2021-06-10 LAB — MAGNESIUM: Magnesium: 2.1 mg/dL (ref 1.7–2.4)

## 2021-06-10 MED ORDER — DARATUMUMAB-HYALURONIDASE-FIHJ 1800-30000 MG-UT/15ML ~~LOC~~ SOLN
1800.0000 mg | Freq: Once | SUBCUTANEOUS | Status: AC
  Administered 2021-06-10: 1800 mg via SUBCUTANEOUS
  Filled 2021-06-10: qty 15

## 2021-06-10 MED ORDER — DIPHENHYDRAMINE HCL 25 MG PO CAPS: 50.0000 mg | ORAL_CAPSULE | Freq: Once | ORAL | Status: DC

## 2021-06-10 MED ORDER — DEXAMETHASONE 4 MG PO TABS: 20.0000 mg | ORAL_TABLET | Freq: Once | ORAL | Status: DC

## 2021-06-10 MED ORDER — ACETAMINOPHEN 325 MG PO TABS: 650.0000 mg | ORAL_TABLET | Freq: Once | ORAL | Status: DC

## 2021-06-10 NOTE — Progress Notes (Signed)
Johnny Lucas presents today for Daratumumab  injection per the provider's orders.  Patient took Tylenol, benadryl, and Dexamethasone prior to visti.  Vital signs and labs within parameters for treatment.  Stable during administration without incident; injection site WNL; see MAR for injection details.  Patient tolerated procedure well and without incident.  No questions or complaints noted at this time. Daratumumab injection given today per MD orders.  Tolerated infusion without adverse affects.  Vital signs stable.  Patient to return for Xgeva on 06/10/21.  No complaints at this time.  Discharge from clinic ambulatory in stable condition.  Alert and oriented X 3.  Follow up with [facility] as scheduled.

## 2021-06-10 NOTE — Patient Instructions (Signed)
[facility]  Discharge Instructions: Thank you for choosing [facility] to provide your oncology and hematology care.  If you have a lab appointment with the Cancer Center, please come in thru the Main Entrance and check in at the main information desk.  Wear comfortable clothing and clothing appropriate for easy access to any Portacath or PICC line.   We strive to give you quality time with your provider. You may need to reschedule your appointment if you arrive late (15 or more minutes).  Arriving late affects you and other patients whose appointments are after yours.  Also, if you miss three or more appointments without notifying the office, you may be dismissed from the clinic at the provider's discretion.      For prescription refill requests, have your pharmacy contact our office and allow 72 hours for refills to be completed.    Today you received the following chemotherapy and/or immunotherapy agents Daratumumab injection      To help prevent nausea and vomiting after your treatment, we encourage you to take your nausea medication as directed.  BELOW ARE SYMPTOMS THAT SHOULD BE REPORTED IMMEDIATELY: *FEVER GREATER THAN 100.4 F (38 C) OR HIGHER *CHILLS OR SWEATING *NAUSEA AND VOMITING THAT IS NOT CONTROLLED WITH YOUR NAUSEA MEDICATION *UNUSUAL SHORTNESS OF BREATH *UNUSUAL BRUISING OR BLEEDING *URINARY PROBLEMS (pain or burning when urinating, or frequent urination) *BOWEL PROBLEMS (unusual diarrhea, constipation, pain near the anus) TENDERNESS IN MOUTH AND THROAT WITH OR WITHOUT PRESENCE OF ULCERS (sore throat, sores in mouth, or a toothache) UNUSUAL RASH, SWELLING OR PAIN  UNUSUAL VAGINAL DISCHARGE OR ITCHING   Items with * indicate a potential emergency and should be followed up as soon as possible or go to the Emergency Department if any problems should occur.  Please show the CHEMOTHERAPY ALERT CARD or IMMUNOTHERAPY ALERT CARD at check-in to the  Emergency Department and triage nurse.  Should you have questions after your visit or need to cancel or reschedule your appointment, please contact [facility] [phone]  and follow the prompts.  Office hours are 8:00 a.m. to 4:30 p.m. Monday - Friday. Please note that voicemails left after 4:00 p.m. may not be returned until the following business day.  We are closed weekends and major holidays. You have access to a nurse at all times for urgent questions. Please call the main number to the clinic [phone] and follow the prompts.  For any non-urgent questions, you may also contact your provider using MyChart. We now offer e-Visits for anyone 18 and older to request care online for non-urgent symptoms. For details visit [email or web address].   Also download the MyChart app! Go to the app store, search "MyChart", open the app, select [place], and log in with your MyChart username and password.  Due to Covid, a mask is required upon entering the hospital/clinic. If you do not have a mask, one will be given to you upon arrival. For doctor visits, patients may have 1 support person aged 18 or older with them. For treatment visits, patients cannot have anyone with them due to current Covid guidelines and our immunocompromised population.  

## 2021-06-10 NOTE — Progress Notes (Signed)
Patient has been assessed, vital signs and labs have been reviewed by Dr. Delton Coombes. ANC, Creatinine, LFTs, are within treatment parameters per Dr. Delton Coombes. The patient is good to proceed with treatment pending CBC.  Primary RN and pharmacy aware.

## 2021-06-10 NOTE — Progress Notes (Signed)
Johnny Lucas, [place] [id number]   CLINIC:  Medical Oncology/Hematology  PCP:  Johnny Percy, MD [address] [place] [address] [phone]   REASON FOR VISIT:  Follow-up for relapsed multiple myeloma  PRIOR THERAPY:  1. RVD x 6 cycles from 03/01/2017 to 06/29/2017. 2. Stem cell transplant on 09/30/2017. 3. Carfilzomib and cyclophosphamide x 2 cycles from 02/14/2018 to 03/29/2018. 4. Carfilzomib x 22 cycles from 04/20/2018 to 01/23/2020.  NGS Results: not done  CURRENT THERAPY: Darzalex Faspro monthly; Pomalyst 2 mg 3/4 weeks; Xgeva monthly  BRIEF ONCOLOGIC HISTORY:  Oncology History  Breast cancer, male (Johnny Lucas)  12/31/2009 Initial Biopsy   Biopsy of L breast     12/31/2009 Pathology Results   Invasive ductal carcinoma, ER/PR+, HER 2 negative    12/31/2009 Imaging   Ultrasound showing a 2.43 x 1.85 x 3 cm hypoechoic spiculated mass in the 12 o clock L breast retroareolar region    01/01/2010 -  Anti-estrogen oral therapy   Tamoxifen 20 mg daily    01/06/2010 Imaging   Bone scan abnormal uptake in the diaphysis of the R humerus, abnormal in the R third, fifth and sixth ribs, lesion also noted in the sternum.    02/03/2010 Surgery   Rod placement and fixation of R humerus by Dr. Amedeo Lucas    02/05/2010 - 02/18/2010 Radiation Therapy   30Gy in 10 fractions of 3 Gy per fraction to R pathologic fracture    03/11/2010 -  Chemotherapy   Denosumab monthly, now every 3 months. Started at [facility]     06/09/2016 Imaging   Three hypermetabolic osseous lesions in the sternum, left ilium and right ilium, as discussed above, likely represent osseous metastases. At this time, these are not recognizable on the CT images. 2. No extra skeletal metastatic disease identified in the neck, chest, abdomen or pelvis.    10/13/2016 Progression   PET shows various new and enlarging osseous metastatic lesions with no definite extra osseous metastatic disease  currently identified.     12/31/2016 Progression   1. Multifocal hypermetabolic osseous metastases throughout the axial and proximal appendicular skeleton, which are increased in size, number and metabolism since 10/13/2016 PET-CT. 2. 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 3. No additional sites of hypermetabolic metastatic disease. 4. Chronic right mastoid sinusitis. 5. Aortic atherosclerosis.  One vessel coronary atherosclerosis.    Multiple myeloma (Johnny Lucas)  02/12/2017 Bone Marrow Biopsy   The marrow was variably cellular with large peritrabecular aggregates of kappa restricted plasma cells (66% by aspirate, 30% by Cd138). Cytogenetics +11.    03/01/2017 - 06/29/2017 Chemotherapy   RVD     05/26/2017 Bone Marrow Biopsy   Performed at [facility]:  Plasma cell myeloma in a 30% cellular marrow with decreased trilineage hematopoiesis and 42% kappa light chain restricted plasma cells on the aspirate smears and large aggregates on the core biopsy.      07/12/2017 - 09/01/2017 Chemotherapy   3 cycles of carfizolmib/cyclophosphamide/dexamethasone      10/08/2017 Bone Marrow Transplant   Autotransplant at [facility]    02/28/2020 -  Chemotherapy    Patient is on Treatment Plan: DARATUMUMAB Q14D       Multiple myeloma not having achieved remission ([place])  06/29/2017 Initial Diagnosis   Multiple myeloma not having achieved remission (Johnny Lucas)    02/28/2020 -  Chemotherapy    Patient is on Treatment Plan: DARATUMUMAB Q14D         CANCER STAGING: Cancer Staging Multiple myeloma ([name] Staging form: Plasma Cell Myeloma and Plasma Cell Disorders, AJCC 8th Edition - Clinical stage from 05/03/2017: Beta-2-microglobulin (mg/L): 3.5, Albumin (g/dL): 3.4, ISS: Stage II, High-risk cytogenetics: Absent, LDH: Not assessed - Signed by Johnny Lucas, Johnny Lucas  on 05/03/2017   INTERVAL HISTORY:  Johnny Lucas, a 75 y.o. male, returns for routine follow-up and consideration for next cycle of chemotherapy. Johnny Lucas was last seen on 04/15/2021.  Due for cycle #24 of Darzalex Faspro today.   Overall, he tells me he has been feeling pretty well. He is tolerating Pomalyst well. He denies any skin rash, and his neuropathy is stable. Is taking gabapentin TID. He has had no infections in the last 2 months.   Overall, he feels ready for next cycle of chemo today.   REVIEW OF SYSTEMS:  Review of Systems  Constitutional:  Positive for fatigue (75%). Negative for appetite change.  Respiratory:  Positive for cough (bronchitis).   Skin:  Negative for rash.  Neurological:  Positive for numbness (feet and legs).  All other systems reviewed and are negative.  PAST MEDICAL/SURGICAL HISTORY:  Past Medical History:  Diagnosis Date   Anxiety    Bone metastases ([name] 09/10/2016   Breast cancer ([name] 2011   Stage IV breast cancer; radiation and tamoxifen   Breast cancer, male ([name]    Stage IV breast cancer; radiation and tamoxifen  Overview:  Left breast ca with mets bone  Overview:  METS TO BONE   GERD (gastroesophageal reflux disease)    Hypertension    Macular degeneration    Multiple myeloma ([name] 02/18/2017   Peripheral neuropathy    Past Surgical History:  Procedure Laterality Date   BACK SURGERY     CATARACT EXTRACTION W/PHACO Left 02/03/2019   Procedure: CATARACT EXTRACTION PHACO AND INTRAOCULAR LENS PLACEMENT (IOC);  Surgeon: Wrzosek, James, MD;  Location: [facility];  Service: Ophthalmology;  Laterality: Left;  CDE: 15.89   HERNIA REPAIR     PORTACATH PLACEMENT Left 06/10/2018   Procedure: INSERTION PORT-A-CATH;  Surgeon: Bridges, Lindsay C, MD;  Location: [facility];  Service: General;  Laterality: Left;   right arm surgery      SOCIAL HISTORY:  Social History   Socioeconomic History   Marital status: Married    Spouse name: Not on file   Number  of children: 3   Years of education: Not on file   Highest education level: Not on file  Occupational History   Not on file  Tobacco Use   Smoking status: Never   Smokeless tobacco: Former  Substance and Sexual Activity   Alcohol use: Yes    Alcohol/week: 24.0 standard drinks    Types: 24 Cans of beer per week   Drug use: No   Sexual activity: Not on file  Other Topics Concern   Not on file  Social History Narrative   Not on file   Social Determinants of Health   Financial Resource Strain: Low Risk    Difficulty of Paying Living Expenses: Not hard at all  Food Insecurity: No Food Insecurity   Worried About Running Out of Food in the Last Year: Never true   Ran Out of Food in the Last Year: Never true  Transportation Needs: No Transportation Needs   Lack of Transportation (Medical): No   Lack of Transportation (Non-Medical): No    Physical Activity: Inactive   Days of Exercise per Week: 0 days   Minutes of Exercise per Session: 0 min  Stress: Stress Concern Present   Feeling of Stress : To some extent  Social Connections: Moderately Integrated   Frequency of Communication with Friends and Family: More than three times a week   Frequency of Social Gatherings with Friends and Family: Three times a week   Attends Religious Services: 1 to 4 times per year   Active Member of Clubs or Organizations: No   Attends Archivist Meetings: Never   Marital Status: Married  Human resources officer Violence: Not At Risk   Fear of Current or Ex-Partner: No   Emotionally Abused: No   Physically Abused: No   Sexually Abused: No    FAMILY HISTORY:  Family History  Problem Relation Age of Onset   Stroke Mother    Cancer Maternal Aunt        cancer NOS; died in her 32s   Lung cancer Maternal Uncle        smoker    CURRENT MEDICATIONS:  Current Outpatient Medications  Medication Sig Dispense Refill   acetaminophen (TYLENOL) 500 MG tablet Take 1,000 mg by mouth every 6 (six)  hours as needed for moderate pain.     acyclovir (ZOVIRAX) 800 MG tablet TAKE 1 TABLET TWICE DAILY (Patient taking differently: Take 800 mg by mouth 2 (two) times daily.) 180 tablet 3   albuterol (VENTOLIN HFA) 108 (90 Base) MCG/ACT inhaler Inhale 2 puffs into the lungs every 6 (six) hours as needed for wheezing or shortness of breath.     ALPRAZolam (XANAX) 0.5 MG tablet 1 tab PO QHS PRN anxiety/sleep (Patient taking differently: Take 0.5 mg by mouth at bedtime as needed for sleep. 1 tab PO QHS PRN anxiety/sleep) 30 tablet 5   aspirin EC 81 MG tablet Take 81 mg by mouth daily.      denosumab (XGEVA) 120 MG/1.7ML SOLN injection Inject 120 mg into the skin every 30 (thirty) days.      dexamethasone (DECADRON) 4 MG tablet Take 20 mg by mouth every Monday.     diphenhydrAMINE (BENADRYL) 25 MG tablet Take 25 mg by mouth daily as needed for allergies.     folic acid (FOLVITE) 1 MG tablet TAKE 1 TABLET BY MOUTH DAILY (Patient taking differently: Take 1 mg by mouth daily.) 90 tablet 2   gabapentin (NEURONTIN) 300 MG capsule Take 1 capsule (300 mg total) by mouth 3 (three) times daily. (Patient taking differently: Take 300 mg by mouth 2 (two) times daily.) 180 capsule 2   loratadine (CLARITIN) 10 MG tablet Take 1 tablet (10 mg total) by mouth daily. 90 tablet 1   metoprolol succinate (TOPROL-XL) 100 MG 24 hr tablet Take 100 mg by mouth daily.     Multiple Vitamin (MULTIVITAMIN WITH MINERALS) TABS tablet Take 1 tablet by mouth daily.      pantoprazole (PROTONIX) 40 MG tablet Take 40 mg by mouth every other day.      polyethylene glycol (MIRALAX / GLYCOLAX) packet Take 17 g by mouth daily as needed for mild constipation. 14 each 0   POMALYST 2 MG capsule TAKE 1 CAPSULE BY MOUTH DAILY FOR 21 DAYS 21 capsule 0   tamoxifen (NOLVADEX) 20 MG tablet Take 1 tablet (20 mg total) by mouth daily. 90 tablet 3   ondansetron (ZOFRAN) 8 MG tablet TAKE 1 TABLET BY MOUTH EVERY 8 HOURS AS NEEDED FOR NAUSEA AND VOMITING  (  Patient not taking: Reported on 06/10/2021) 30 tablet 2   No current facility-administered medications for this visit.   Facility-Administered Medications Ordered in Other Visits  Medication Dose Route Frequency Provider Last Rate Last Admin   ondansetron (ZOFRAN) 8 mg in sodium chloride 0.9 % 50 mL IVPB  8 mg Intravenous Once Lockamy, Randi L, NP-C       sodium chloride flush (NS) 0.9 % injection 10 mL  10 mL Intracatheter Once PRN Derek Jack, MD        ALLERGIES:  Allergies  Allergen Reactions   Cefepime     Suspected severe thrombocytopenia is a result of cefepime induced antigen platelet destruction    PHYSICAL EXAM:  Performance status (ECOG): 1 - Symptomatic but completely ambulatory  Vitals:   06/10/21 1002  BP: 140/70  Pulse: (!) 54  Resp: 18  Temp: (!) 97 F (36.1 C)  SpO2: 100%   Wt Readings from Last 3 Encounters:  06/10/21 216 lb 12.8 oz (98.3 kg)  05/13/21 212 lb 9.6 oz (96.4 kg)  04/15/21 211 lb 6.4 oz (95.9 kg)   Physical Exam Vitals reviewed.  Constitutional:      Appearance: Normal appearance.  Cardiovascular:     Rate and Rhythm: Normal rate and regular rhythm.     Pulses: Normal pulses.     Heart sounds: Normal heart sounds.  Pulmonary:     Effort: Pulmonary effort is normal.     Breath sounds: Normal breath sounds.  Musculoskeletal:     Right lower leg: Edema (trace) present.     Left lower leg: Edema (trace) present.  Neurological:     General: No focal deficit present.     Mental Status: He is alert and oriented to person, place, and time.  Psychiatric:        Mood and Affect: Mood normal.        Behavior: Behavior normal.    LABORATORY DATA:  I have reviewed the labs as listed.  CBC Latest Ref Rng & Units 05/13/2021 04/15/2021 02/18/2021  WBC 4.0 - 10.5 K/uL 4.2 3.4(L) 4.7  Hemoglobin 13.0 - 17.0 g/dL 11.0(L) 11.1(L) 11.1(L)  Hematocrit 39.0 - 52.0 % 33.8(L) 33.9(L) 34.2(L)  Platelets 150 - 400 K/uL 158 161 150   CMP Latest  Ref Rng & Units 06/10/2021 05/13/2021 04/15/2021  Glucose 70 - 99 mg/dL 95 83 104(H)  BUN 8 - 23 mg/dL _0 Creatinine 0.61 - 1.24 mg/dL 1.29(H) 1.32(H) 1.58(H)  Sodium 135 - 145 mmol/L 137 138 135  Potassium 3.5 - 5.1 mmol/L 4.4 4.8 4.2  Chloride 98 - 111 mmol/L 107 105 104  CO2 22 - 32 mmol/L _1 Calcium 8.9 - 10.3 mg/dL 8.1(L) 8.4(L) 8.5(L)  Total Protein 6.5 - 8.1 g/dL 5.0(L) 5.3(L) 5.7(L)  Total Bilirubin 0.3 - 1.2 mg/dL 0.4 0.6 0.5  Alkaline Phos 38 - 126 U/L 29(L) 28(L) 29(L)  AST 15 - 41 U/L _2 ALT 0 - 44 U/L _3 DIAGNOSTIC IMAGING:  I have independently reviewed the scans and discussed with the patient. No results found.   ASSESSMENT:  1.  Relapsed multiple myeloma: -Autologous stem cell transplant on 10/08/2017 -Post transplant consolidation with CyCarD for 2 cycles with M spike undetectable. -Maintenance therapy with carfilzomib 70 mg per metered square days 1 and 15 every 28 days from 04/20/2018, dose reduced to 35 mg per metered square on 01/05/2019, titrated up to 56 mg per metered  square on 01/09/2020. 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 -PET scan on 02/18/2020 showed new left supraclavicular lymph node at the thoracic inlet, SUV 14.2.  Multifocal osseous lesions largely improved, though residual lesions noted including right acromion, left acromion, thyroid cartilage, right sternum.  Additional lesions throughout the sternum, bilateral ribs, thoracolumbar spine, bilateral pelvis have resolved. -BMBX on 02/14/2020 shows plasma cell myeloma, 60-70% of cells.  FISH for myeloma was negative.  Cytogenetics was normal. -4 cycles of daratumumab, bortezomib and dexamethasone from 02/28/2020 through 04/29/2020. -Daratumumab, pomalidomide and dexamethasone started on 05/28/2020.  He is receiving daratumumab every 2 weeks, Pomalyst 3 mg days 1-21, dexamethasone 20 mg weekly. -Pomalidomide dose reduced to 2 mg days 1-21  around the first week of July 2021, due to severe weakness.   PLAN:  1.  Relapsed IgG kappa multiple myeloma: - He is taking pomalidomide 2 mg 3 weeks on 1 week off along with dexamethasone 20 mg weekly. - Reviewed myeloma labs from 04/15/2021 which showed M spike of 0.1 g.  Free light chain ratio is 0.74. - Labs today showed creatinine improved to 1.29.  Other LFTs are within normal limits.  White count and platelet count is normal. - We will follow-up on myeloma labs from today.  He will proceed with Darzalex monthly injection today and in 1 month.  I will see him back in 8 weeks for follow-up.   2.  Myeloma bone disease: - We started him back on Xgeva a month ago.  He is doing very well.   3.  Macrocytic anemia: - Hemoglobin staying between 10-12.  No growth factor needed unless hemoglobin drops below 9.   4.  ID prophylaxis: - Continue acyclovir twice daily.   5.  Peripheral neuropathy: - Continue gabapentin 300 mg 3 times daily.   6.  History of breast cancer: - Continue tamoxifen daily.   Orders placed this encounter:  No orders of the defined types were placed in this encounter.    Sreedhar Katragadda, MD [facility] [phone]   I, Kirstyn Evans, am acting as a scribe for Dr. Sreedhar Katragadda.  I, Sreedhar Katragadda MD, have reviewed the above documentation for accuracy and completeness, and I agree with the above.     

## 2021-06-10 NOTE — Progress Notes (Signed)
Patient is taking Pomalyst and Tamoxifen as prescribed and is not having any side effects.

## 2021-06-10 NOTE — Patient Instructions (Signed)
[place] at [facility] Discharge Instructions  You were seen today by Dr. Delton Coombes. He went over your recent results, and you received your treatment. Dr. Delton Coombes will see you back in 2 months for labs and follow up.   Thank you for choosing [place] at [facility] to provide your oncology and hematology care.  To afford each patient quality time with our provider, please arrive at least 15 minutes before your scheduled appointment time.   If you have a lab appointment with the [facility] please come in thru the Main Entrance and check in at the main information desk  You need to re-schedule your appointment should you arrive 10 or more minutes late.  We strive to give you quality time with our providers, and arriving late affects you and other patients whose appointments are after yours.  Also, if you no show three or more times for appointments you may be dismissed from the clinic at the providers discretion.     Again, thank you for choosing [facility].  Our hope is that these requests will decrease the amount of time that you wait before being seen by our physicians.       _____________________________________________________________  Should you have questions after your visit to [facility], please contact our office at [phone] between the hours of 8:00 a.m. and 4:30 p.m.  Voicemails left after 4:00 p.m. will not be returned until the following business day.  For prescription refill requests, have your pharmacy contact our office and allow 72 hours.    Cancer Center Support Programs:   > Cancer Support Group  2nd Tuesday of the month 1pm-2pm, Journey Room

## 2021-06-11 ENCOUNTER — Inpatient Hospital Stay (HOSPITAL_COMMUNITY): Payer: Medicare Other

## 2021-06-11 VITALS — BP 140/66 | HR 61 | Resp 18

## 2021-06-11 DIAGNOSIS — Z5111 Encounter for antineoplastic chemotherapy: Secondary | ICD-10-CM | POA: Diagnosis not present

## 2021-06-11 DIAGNOSIS — C9000 Multiple myeloma not having achieved remission: Secondary | ICD-10-CM

## 2021-06-11 DIAGNOSIS — C7951 Secondary malignant neoplasm of bone: Secondary | ICD-10-CM | POA: Diagnosis not present

## 2021-06-11 DIAGNOSIS — Z170 Estrogen receptor positive status [ER+]: Secondary | ICD-10-CM | POA: Diagnosis not present

## 2021-06-11 DIAGNOSIS — C50922 Malignant neoplasm of unspecified site of left male breast: Secondary | ICD-10-CM | POA: Diagnosis not present

## 2021-06-11 DIAGNOSIS — Z79810 Long term (current) use of selective estrogen receptor modulators (SERMs): Secondary | ICD-10-CM | POA: Diagnosis not present

## 2021-06-11 LAB — PROTEIN ELECTROPHORESIS, SERUM
A/G Ratio: 1.9 — ABNORMAL HIGH (ref 0.7–1.7)
Albumin ELP: 3.3 g/dL (ref 2.9–4.4)
Alpha-1-Globulin: 0.2 g/dL (ref 0.0–0.4)
Alpha-2-Globulin: 0.5 g/dL (ref 0.4–1.0)
Beta Globulin: 0.8 g/dL (ref 0.7–1.3)
Gamma Globulin: 0.2 g/dL — ABNORMAL LOW (ref 0.4–1.8)
Globulin, Total: 1.7 g/dL — ABNORMAL LOW (ref 2.2–3.9)
M-Spike, %: 0.1 g/dL — ABNORMAL HIGH
Total Protein ELP: 5 g/dL — ABNORMAL LOW (ref 6.0–8.5)

## 2021-06-11 LAB — KAPPA/LAMBDA LIGHT CHAINS
Kappa free light chain: 1.2 mg/L — ABNORMAL LOW (ref 3.3–19.4)
Kappa, lambda light chain ratio: >0.8 (ref 0.26–1.65)
Lambda free light chains: <1.5 mg/L — ABNORMAL LOW (ref 5.7–26.3)

## 2021-06-11 MED ORDER — DENOSUMAB 120 MG/1.7ML ~~LOC~~ SOLN
120.0000 mg | Freq: Once | SUBCUTANEOUS | Status: AC
  Administered 2021-06-11: 120 mg via SUBCUTANEOUS

## 2021-06-11 MED ORDER — DENOSUMAB 120 MG/1.7ML ~~LOC~~ SOLN
SUBCUTANEOUS | Status: AC
  Filled 2021-06-11: qty 1.7

## 2021-06-11 NOTE — Progress Notes (Signed)
Patient tolerated Xgeva 120 mg injection with no complaints voiced.  Site clean and dry with no bruising or swelling noted.  No complaints of pain.  Discharged with vital signs stable and no signs or symptoms of distress noted.

## 2021-06-19 ENCOUNTER — Other Ambulatory Visit (HOSPITAL_COMMUNITY): Payer: Self-pay | Admitting: Hematology

## 2021-06-19 DIAGNOSIS — G4700 Insomnia, unspecified: Secondary | ICD-10-CM

## 2021-06-19 DIAGNOSIS — I10 Essential (primary) hypertension: Secondary | ICD-10-CM

## 2021-06-23 ENCOUNTER — Encounter (HOSPITAL_COMMUNITY): Payer: Self-pay | Admitting: Hematology

## 2021-06-23 ENCOUNTER — Other Ambulatory Visit (HOSPITAL_COMMUNITY): Payer: Self-pay

## 2021-06-23 DIAGNOSIS — G4700 Insomnia, unspecified: Secondary | ICD-10-CM

## 2021-06-23 DIAGNOSIS — I10 Essential (primary) hypertension: Secondary | ICD-10-CM

## 2021-06-23 MED ORDER — POMALIDOMIDE 2 MG PO CAPS: ORAL_CAPSULE | ORAL | 0 refills | Status: DC

## 2021-06-23 NOTE — Telephone Encounter (Signed)
Chart reviewed. Pomalyst refilled per Dr. Delton Coombes

## 2021-06-25 ENCOUNTER — Other Ambulatory Visit (HOSPITAL_COMMUNITY): Payer: Self-pay | Admitting: Hematology

## 2021-06-25 DIAGNOSIS — C9000 Multiple myeloma not having achieved remission: Secondary | ICD-10-CM

## 2021-07-08 ENCOUNTER — Inpatient Hospital Stay (HOSPITAL_COMMUNITY): Payer: Medicare Other | Attending: Hematology

## 2021-07-08 ENCOUNTER — Inpatient Hospital Stay (HOSPITAL_COMMUNITY): Payer: Medicare Other

## 2021-07-08 ENCOUNTER — Other Ambulatory Visit: Payer: Self-pay

## 2021-07-08 ENCOUNTER — Encounter (HOSPITAL_COMMUNITY): Payer: Self-pay

## 2021-07-08 VITALS — BP 126/71 | HR 67 | Temp 96.9°F | Resp 18 | Wt 217.2 lb

## 2021-07-08 DIAGNOSIS — C9000 Multiple myeloma not having achieved remission: Secondary | ICD-10-CM

## 2021-07-08 DIAGNOSIS — C9002 Multiple myeloma in relapse: Secondary | ICD-10-CM

## 2021-07-08 DIAGNOSIS — Z5111 Encounter for antineoplastic chemotherapy: Secondary | ICD-10-CM | POA: Insufficient documentation

## 2021-07-08 LAB — CBC WITH DIFFERENTIAL/PLATELET
Abs Immature Granulocytes: 0.02 10*3/uL (ref 0.00–0.07)
Basophils Absolute: 0.1 10*3/uL (ref 0.0–0.1)
Basophils Relative: 2 %
Eosinophils Absolute: 0.7 10*3/uL — ABNORMAL HIGH (ref 0.0–0.5)
Eosinophils Relative: 16 %
HCT: 32.1 % — ABNORMAL LOW (ref 39.0–52.0)
Hemoglobin: 10.4 g/dL — ABNORMAL LOW (ref 13.0–17.0)
Immature Granulocytes: 1 %
Lymphocytes Relative: 19 %
Lymphs Abs: 0.8 10*3/uL (ref 0.7–4.0)
MCH: 36.4 pg — ABNORMAL HIGH (ref 26.0–34.0)
MCHC: 32.4 g/dL (ref 30.0–36.0)
MCV: 112.2 fL — ABNORMAL HIGH (ref 80.0–100.0)
Monocytes Absolute: 0.7 10*3/uL (ref 0.1–1.0)
Monocytes Relative: 17 %
Neutro Abs: 1.8 10*3/uL (ref 1.7–7.7)
Neutrophils Relative %: 45 %
Platelets: 141 10*3/uL — ABNORMAL LOW (ref 150–400)
RBC: 2.86 MIL/uL — ABNORMAL LOW (ref 4.22–5.81)
RDW: 15.7 % — ABNORMAL HIGH (ref 11.5–15.5)
WBC: 4.1 10*3/uL (ref 4.0–10.5)
nRBC: 0 % (ref 0.0–0.2)

## 2021-07-08 LAB — COMPREHENSIVE METABOLIC PANEL
ALT: 15 U/L (ref 0–44)
AST: 16 U/L (ref 15–41)
Albumin: 3.2 g/dL — ABNORMAL LOW (ref 3.5–5.0)
Alkaline Phosphatase: 30 U/L — ABNORMAL LOW (ref 38–126)
Anion gap: 5 (ref 5–15)
BUN: 15 mg/dL (ref 8–23)
CO2: 27 mmol/L (ref 22–32)
Calcium: 8.1 mg/dL — ABNORMAL LOW (ref 8.9–10.3)
Chloride: 105 mmol/L (ref 98–111)
Creatinine, Ser: 1.57 mg/dL — ABNORMAL HIGH (ref 0.61–1.24)
GFR, Estimated: 46 mL/min — ABNORMAL LOW (ref >60)
Glucose, Bld: 89 mg/dL (ref 70–99)
Potassium: 3.5 mmol/L (ref 3.5–5.1)
Sodium: 137 mmol/L (ref 135–145)
Total Bilirubin: 0.6 mg/dL (ref 0.3–1.2)
Total Protein: 5.3 g/dL — ABNORMAL LOW (ref 6.5–8.1)

## 2021-07-08 LAB — MAGNESIUM: Magnesium: 1.9 mg/dL (ref 1.7–2.4)

## 2021-07-08 LAB — LACTATE DEHYDROGENASE: LDH: 127 U/L (ref 98–192)

## 2021-07-08 MED ORDER — ACETAMINOPHEN 325 MG PO TABS
ORAL_TABLET | ORAL | Status: AC
  Filled 2021-07-08: qty 2

## 2021-07-08 MED ORDER — DEXAMETHASONE 4 MG PO TABS
ORAL_TABLET | ORAL | Status: AC
  Filled 2021-07-08: qty 5

## 2021-07-08 MED ORDER — DEXAMETHASONE 4 MG PO TABS
20.0000 mg | ORAL_TABLET | Freq: Once | ORAL | Status: AC
  Administered 2021-07-08: 20 mg via ORAL

## 2021-07-08 MED ORDER — DIPHENHYDRAMINE HCL 25 MG PO CAPS
50.0000 mg | ORAL_CAPSULE | Freq: Once | ORAL | Status: AC
  Administered 2021-07-08: 50 mg via ORAL

## 2021-07-08 MED ORDER — DARATUMUMAB-HYALURONIDASE-FIHJ 1800-30000 MG-UT/15ML ~~LOC~~ SOLN
1800.0000 mg | Freq: Once | SUBCUTANEOUS | Status: AC
  Administered 2021-07-08: 1800 mg via SUBCUTANEOUS
  Filled 2021-07-08: qty 15

## 2021-07-08 MED ORDER — DIPHENHYDRAMINE HCL 25 MG PO CAPS
ORAL_CAPSULE | ORAL | Status: AC
  Filled 2021-07-08: qty 2

## 2021-07-08 MED ORDER — ACETAMINOPHEN 325 MG PO TABS
650.0000 mg | ORAL_TABLET | Freq: Once | ORAL | Status: AC
  Administered 2021-07-08: 650 mg via ORAL

## 2021-07-08 NOTE — Patient Instructions (Signed)
[facility]  Discharge Instructions: Thank you for choosing [facility] to provide your oncology and hematology care.  If you have a lab appointment with the [facility], please come in thru the Main Entrance and check in at the main information desk.  Wear comfortable clothing and clothing appropriate for easy access to any Portacath or PICC line.   We strive to give you quality time with your provider. You may need to reschedule your appointment if you arrive late (15 or more minutes).  Arriving late affects you and other patients whose appointments are after yours.  Also, if you miss three or more appointments without notifying the office, you may be dismissed from the clinic at the provider's discretion.      For prescription refill requests, have your pharmacy contact our office and allow 72 hours for refills to be completed.    Today you received the following chemotherapy and/or immunotherapy agents Darzalex Faspro [place]      To help prevent nausea and vomiting after your treatment, we encourage you to take your nausea medication as directed.  BELOW ARE SYMPTOMS THAT SHOULD BE REPORTED IMMEDIATELY: *FEVER GREATER THAN 100.4 F (38 C) OR HIGHER *CHILLS OR SWEATING *NAUSEA AND VOMITING THAT IS NOT CONTROLLED WITH YOUR NAUSEA MEDICATION *UNUSUAL SHORTNESS OF BREATH *UNUSUAL BRUISING OR BLEEDING *URINARY PROBLEMS (pain or burning when urinating, or frequent urination) *BOWEL PROBLEMS (unusual diarrhea, constipation, pain near the anus) TENDERNESS IN MOUTH AND THROAT WITH OR WITHOUT PRESENCE OF ULCERS (sore throat, sores in mouth, or a toothache) UNUSUAL RASH, SWELLING OR PAIN  UNUSUAL VAGINAL DISCHARGE OR ITCHING   Items with * indicate a potential emergency and should be followed up as soon as possible or go to the Emergency Department if any problems should occur.  Please show the CHEMOTHERAPY ALERT CARD or IMMUNOTHERAPY ALERT CARD at check-in to the  Emergency Department and triage nurse.  Should you have questions after your visit or need to cancel or reschedule your appointment, please contact [facility] [phone]  and follow the prompts.  Office hours are 8:00 a.m. to 4:30 p.m. Monday - Friday. Please note that voicemails left after 4:00 p.m. may not be returned until the following business day.  We are closed weekends and major holidays. You have access to a nurse at all times for urgent questions. Please call the main number to the clinic [phone] and follow the prompts.  For any non-urgent questions, you may also contact your provider using MyChart. We now offer e-Visits for anyone 73 and older to request care online for non-urgent symptoms. For details visit [email or web address].   Also download the MyChart app! Go to the app store, search "MyChart", open the app, select [facility], and log in with your MyChart username and password.  Due to Covid, a mask is required upon entering the hospital/clinic. If you do not have a mask, one will be given to you upon arrival. For doctor visits, patients may have 1 support person aged 73 or older with them. For treatment visits, patients cannot have anyone with them due to current Covid guidelines and our immunocompromised population.

## 2021-07-08 NOTE — Progress Notes (Signed)
Patient presents today for treatment. Vital signs within parameters for today's treatment. Labs pending. MAR reviewed and updated. Patient denies any significant changes since his last treatment. Patient has no complaints today.   Creatinine today 1.57.   Reported creatinine to Dr. Delton Coombes through secure chat. Labs reviewed by MD. Proceed with treatment today per Dr. Delton Coombes and encourage water 2-3 Liters daily. Patient taking Pomalyst 2 mgs daily as prescribed with no side effects noted.   Treatment given today per MD orders. Tolerated without adverse affects. Vital signs stable. No complaints at this time. Discharged from clinic ambulatory in stable condition. Alert and oriented x 3. F/U with [facility] as scheduled.

## 2021-07-09 ENCOUNTER — Inpatient Hospital Stay (HOSPITAL_COMMUNITY): Payer: Medicare Other

## 2021-07-09 VITALS — BP 136/63 | HR 69 | Temp 97.1°F | Resp 18

## 2021-07-09 DIAGNOSIS — Z5111 Encounter for antineoplastic chemotherapy: Secondary | ICD-10-CM | POA: Diagnosis not present

## 2021-07-09 DIAGNOSIS — C9000 Multiple myeloma not having achieved remission: Secondary | ICD-10-CM | POA: Diagnosis not present

## 2021-07-09 DIAGNOSIS — C7951 Secondary malignant neoplasm of bone: Secondary | ICD-10-CM

## 2021-07-09 LAB — KAPPA/LAMBDA LIGHT CHAINS
Kappa free light chain: 1.4 mg/L — ABNORMAL LOW (ref 3.3–19.4)
Kappa, lambda light chain ratio: >0.93 (ref 0.26–1.65)
Lambda free light chains: <1.5 mg/L — ABNORMAL LOW (ref 5.7–26.3)

## 2021-07-09 MED ORDER — DENOSUMAB 120 MG/1.7ML ~~LOC~~ SOLN
120.0000 mg | Freq: Once | SUBCUTANEOUS | Status: AC
  Administered 2021-07-09: 120 mg via SUBCUTANEOUS

## 2021-07-09 MED ORDER — DENOSUMAB 120 MG/1.7ML ~~LOC~~ SOLN
SUBCUTANEOUS | Status: AC
  Filled 2021-07-09: qty 1.7

## 2021-07-09 NOTE — Progress Notes (Signed)
Xgeva injection given per orders. Patient tolerated it well without problems. Vitals stable and discharged home from clinic ambulatory. Follow up as scheduled.  

## 2021-07-09 NOTE — Patient Instructions (Signed)
[facility]  Discharge Instructions: Thank you for choosing [facility] to provide your oncology and hematology care.  If you have a lab appointment with the Cancer Center, please come in thru the Main Entrance and check in at the main information desk.  Wear comfortable clothing and clothing appropriate for easy access to any Portacath or PICC line.   We strive to give you quality time with your provider. You may need to reschedule your appointment if you arrive late (15 or more minutes).  Arriving late affects you and other patients whose appointments are after yours.  Also, if you miss three or more appointments without notifying the office, you may be dismissed from the clinic at the provider's discretion.      For prescription refill requests, have your pharmacy contact our office and allow 72 hours for refills to be completed.        To help prevent nausea and vomiting after your treatment, we encourage you to take your nausea medication as directed.  BELOW ARE SYMPTOMS THAT SHOULD BE REPORTED IMMEDIATELY: *FEVER GREATER THAN 100.4 F (38 C) OR HIGHER *CHILLS OR SWEATING *NAUSEA AND VOMITING THAT IS NOT CONTROLLED WITH YOUR NAUSEA MEDICATION *UNUSUAL SHORTNESS OF BREATH *UNUSUAL BRUISING OR BLEEDING *URINARY PROBLEMS (pain or burning when urinating, or frequent urination) *BOWEL PROBLEMS (unusual diarrhea, constipation, pain near the anus) TENDERNESS IN MOUTH AND THROAT WITH OR WITHOUT PRESENCE OF ULCERS (sore throat, sores in mouth, or a toothache) UNUSUAL RASH, SWELLING OR PAIN  UNUSUAL VAGINAL DISCHARGE OR ITCHING   Items with * indicate a potential emergency and should be followed up as soon as possible or go to the Emergency Department if any problems should occur.  Please show the CHEMOTHERAPY ALERT CARD or IMMUNOTHERAPY ALERT CARD at check-in to the Emergency Department and triage nurse.  Should you have questions after your visit or need to cancel  or reschedule your appointment, please contact [facility] [phone]  and follow the prompts.  Office hours are 8:00 a.m. to 4:30 p.m. Monday - Friday. Please note that voicemails left after 4:00 p.m. may not be returned until the following business day.  We are closed weekends and major holidays. You have access to a nurse at all times for urgent questions. Please call the main number to the clinic [phone] and follow the prompts.  For any non-urgent questions, you may also contact your provider using MyChart. We now offer e-Visits for anyone 18 and older to request care online for non-urgent symptoms. For details visit [email or web address].   Also download the MyChart app! Go to the app store, search "MyChart", open the app, select [facility], and log in with your MyChart username and password.  Due to Covid, a mask is required upon entering the hospital/clinic. If you do not have a mask, one will be given to you upon arrival. For doctor visits, patients may have 1 support person aged 18 or older with them. For treatment visits, patients cannot have anyone with them due to current Covid guidelines and our immunocompromised population.  

## 2021-07-11 LAB — IMMUNOFIXATION ELECTROPHORESIS
IgA: <5 mg/dL — ABNORMAL LOW (ref 61–437)
IgG (Immunoglobin G), Serum: 123 mg/dL — ABNORMAL LOW (ref 603–1613)
IgM (Immunoglobulin M), Srm: <5 mg/dL — ABNORMAL LOW (ref 15–143)
Total Protein ELP: 4.9 g/dL — ABNORMAL LOW (ref 6.0–8.5)

## 2021-07-13 ENCOUNTER — Other Ambulatory Visit (HOSPITAL_COMMUNITY): Payer: Self-pay | Admitting: Hematology

## 2021-07-13 DIAGNOSIS — G4700 Insomnia, unspecified: Secondary | ICD-10-CM

## 2021-07-13 DIAGNOSIS — I10 Essential (primary) hypertension: Secondary | ICD-10-CM

## 2021-07-14 LAB — PROTEIN ELECTROPHORESIS, SERUM
A/G Ratio: 1.7 (ref 0.7–1.7)
Albumin ELP: 3.2 g/dL (ref 2.9–4.4)
Alpha-1-Globulin: 0.3 g/dL (ref 0.0–0.4)
Alpha-2-Globulin: 0.8 g/dL (ref 0.4–1.0)
Beta Globulin: 0.7 g/dL (ref 0.7–1.3)
Gamma Globulin: 0.1 g/dL — ABNORMAL LOW (ref 0.4–1.8)
Globulin, Total: 1.9 g/dL — ABNORMAL LOW (ref 2.2–3.9)
M-Spike, %: 0.1 g/dL — ABNORMAL HIGH
Total Protein ELP: 5.1 g/dL — ABNORMAL LOW (ref 6.0–8.5)

## 2021-07-17 ENCOUNTER — Encounter (HOSPITAL_COMMUNITY): Payer: Self-pay | Admitting: Hematology

## 2021-07-17 NOTE — Telephone Encounter (Signed)
Chart reviewed. Pomalyst refilled per last office note with Dr. Katragadda.  

## 2021-07-21 DIAGNOSIS — H25811 Combined forms of age-related cataract, right eye: Secondary | ICD-10-CM | POA: Diagnosis not present

## 2021-07-21 NOTE — H&P (Signed)
Surgical History & Physical  Patient Name: Johnny Lucas DOB: [birth year]  Surgery: Cataract extraction with intraocular lens implant phacoemulsification; Right Eye  Surgeon: Baruch Goldmann MD Surgery Date:  07/25/2021 Pre-Op Date:  07/21/2021  HPI: A 89 Yr. old male patient Hx of left cataract sx here , pt is self referred for cataract sx in right eye. 1. The patient complains of difficulty when viewing TV, reading closed caption, news scrolls on TV, which began many years ago. The right eye is affected. The episode is gradual. The condition's severity increased since last visit. Symptoms occur when the patient is inside and outside. This is negataively affecting his quality of life. HPI was performed by Baruch Goldmann .  Medical History: Cataracts Macula Degeneration Central Serous Chorioretinopathy (laser tx) Macula Degeneration Cancer High Blood Pressure Multiple Myeloma In Preventive viral infection me...  Review of Systems Allergic/Immunologic Seasonal Allergies All recorded systems are negative except as noted above.  Social   Never smoked   Medication Gati-brom-pred,  Acyclovir, Albuterol, B-12, Alprazolam, Aspirin, Calcium, Folic acid, Gabapentin, Loratadine, Metoprolol, Multivitamin, Pantoprazole, Pseudoephedrine, Tamoxifen, Amlodipine, Carfilzomib, Denosumab, Dexamethasone, Folic acid, Pomalyst,   Sx/Procedures Phaco c IOL,   Tonsils, Hernia, Neck, Pin in Right Arm,  Drug Allergies   NKDA  History & Physical: Heent:  Cataract, Right eye NECK: supple without bruits LUNGS: lungs clear to auscultation CV: regular rate and rhythm Abdomen: soft and non-tender  Impression & Plan: Assessment: 1.  COMBINED FORMS AGE RELATED CATARACT; Right Eye (H25.811) 2.  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  INTRAOCULAR LENS IOL ; Left Eye (Z96.1) 4.  BLEPHARITIS; Right Upper Lid, Right Lower Lid, Left Upper Lid, Left Lower Lid (H01.001,  H01.002,H01.004,H01.005) 5.  CONJUNCTIVOCHALASIS; Both Eyes (H11.823) 6.  DERMATOCHALASIS; Right Upper Lid, Left Upper Lid (H02.831, W97.989)  Plan: 1.  Cataract accounts for the patient's decreased vision. This visual impairment is not correctable with a tolerable change in glasses or contact lenses. Cataract surgery with an implantation of a new lens should significantly improve the visual and functional status of the patient. Discussed all risks, benefits, alternatives, and potential complications. Discussed the procedures and recovery. Patient desires to have surgery. A-scan ordered and performed today for intra-ocular lens calculations. The surgery will be performed in order to improve vision for driving, reading, and for eye examinations. Recommend phacoemulsification with intra-ocular lens. Recommend Dextenza for post-operative pain and inflammation. Right Eye. Surgery required to correct imbalance of vision. Dilates well - shugarcaine by protocol.  2.  Significant scarring after laser therapy. No signs of any new fluid or hemorrhage on exam today. OCT macula is stable.  3.  Stable. Clear posterior capsule. Call with any worsening vision, pain, or any other concerns.  4.  Recommend regular lid cleaning.  5.  Asymptomatic.  6.  Asymptomatic, recommend observation for now. Findings, prognosis and treatment options reviewed.

## 2021-07-22 ENCOUNTER — Other Ambulatory Visit: Payer: Self-pay

## 2021-07-22 ENCOUNTER — Encounter (HOSPITAL_COMMUNITY)
Admission: RE | Admit: 2021-07-22 | Discharge: 2021-07-22 | Disposition: A | Discharge status: Home / Self Care | Payer: Medicare Other | Source: Ambulatory Visit | Attending: Ophthalmology | Admitting: Ophthalmology

## 2021-07-22 ENCOUNTER — Encounter (HOSPITAL_COMMUNITY): Payer: Self-pay

## 2021-07-22 NOTE — Progress Notes (Signed)
   07/22/21 1545  OBSTRUCTIVE SLEEP APNEA  Have you ever been diagnosed with sleep apnea through a sleep study? No  Do you snore loudly (loud enough to be heard through closed doors)?  1  Do you often feel tired, fatigued, or sleepy during the daytime (such as falling asleep during driving or talking to someone)? 0  Has anyone observed you stop breathing during your sleep? 1  Do you have, or are you being treated for high blood pressure? 1  BMI more than 35 kg/m2? 0  Age > 50 (1-yes) 1  Neck circumference greater than:Male 16 inches or larger, Male 17inches or larger? 1  Male Gender (Yes=1) 1  Obstructive Sleep Apnea Score 6  Score 5 or greater  Results sent to PCP

## 2021-07-23 ENCOUNTER — Other Ambulatory Visit (HOSPITAL_COMMUNITY): Payer: Self-pay | Admitting: Hematology

## 2021-07-23 DIAGNOSIS — C9000 Multiple myeloma not having achieved remission: Secondary | ICD-10-CM

## 2021-07-25 ENCOUNTER — Ambulatory Visit (HOSPITAL_COMMUNITY)
Admission: RE | Admit: 2021-07-25 | Discharge: 2021-07-25 | Disposition: A | Discharge status: Home / Self Care | Payer: Medicare Other | Attending: Ophthalmology | Admitting: Ophthalmology

## 2021-07-25 ENCOUNTER — Encounter (HOSPITAL_COMMUNITY): Payer: Self-pay | Admitting: Ophthalmology

## 2021-07-25 ENCOUNTER — Ambulatory Visit (HOSPITAL_COMMUNITY): Payer: Medicare Other | Admitting: Anesthesiology

## 2021-07-25 ENCOUNTER — Other Ambulatory Visit (HOSPITAL_COMMUNITY): Payer: Self-pay | Admitting: Hematology

## 2021-07-25 ENCOUNTER — Encounter (HOSPITAL_COMMUNITY)
Admission: RE | Disposition: A | Discharge status: Home / Self Care | Payer: Self-pay | Source: Home / Self Care | Attending: Ophthalmology

## 2021-07-25 DIAGNOSIS — C9000 Multiple myeloma not having achieved remission: Secondary | ICD-10-CM | POA: Diagnosis not present

## 2021-07-25 DIAGNOSIS — Z79810 Long term (current) use of selective estrogen receptor modulators (SERMs): Secondary | ICD-10-CM | POA: Insufficient documentation

## 2021-07-25 DIAGNOSIS — C50922 Malignant neoplasm of unspecified site of left male breast: Secondary | ICD-10-CM

## 2021-07-25 DIAGNOSIS — Z7952 Long term (current) use of systemic steroids: Secondary | ICD-10-CM | POA: Insufficient documentation

## 2021-07-25 DIAGNOSIS — H25811 Combined forms of age-related cataract, right eye: Secondary | ICD-10-CM | POA: Diagnosis not present

## 2021-07-25 DIAGNOSIS — Z170 Estrogen receptor positive status [ER+]: Secondary | ICD-10-CM

## 2021-07-25 DIAGNOSIS — Z7982 Long term (current) use of aspirin: Secondary | ICD-10-CM | POA: Insufficient documentation

## 2021-07-25 DIAGNOSIS — Z79899 Other long term (current) drug therapy: Secondary | ICD-10-CM | POA: Insufficient documentation

## 2021-07-25 HISTORY — PX: CATARACT EXTRACTION W/PHACO: SHX586

## 2021-07-25 SURGERY — PHACOEMULSIFICATION, CATARACT, WITH IOL INSERTION
Anesthesia: Monitor Anesthesia Care | Site: Eye | Laterality: Right

## 2021-07-25 MED ORDER — LIDOCAINE HCL 3.5 % OP GEL
1.0000 "application " | Freq: Once | OPHTHALMIC | Status: AC
  Administered 2021-07-25: 1 via OPHTHALMIC

## 2021-07-25 MED ORDER — TETRACAINE HCL 0.5 % OP SOLN
1.0000 [drp] | OPHTHALMIC | Status: AC | PRN
  Administered 2021-07-25 (×3): 1 [drp] via OPHTHALMIC

## 2021-07-25 MED ORDER — LIDOCAINE HCL (PF) 1 % IJ SOLN
INTRAOCULAR | Status: DC | PRN
  Administered 2021-07-25: 1 mL via OPHTHALMIC

## 2021-07-25 MED ORDER — NEOMYCIN-POLYMYXIN-DEXAMETH 3.5-10000-0.1 OP SUSP
OPHTHALMIC | Status: DC | PRN
  Administered 2021-07-25: 1 [drp] via OPHTHALMIC

## 2021-07-25 MED ORDER — TROPICAMIDE 1 % OP SOLN
1.0000 [drp] | OPHTHALMIC | Status: AC
  Administered 2021-07-25 (×3): 1 [drp] via OPHTHALMIC

## 2021-07-25 MED ORDER — BSS IO SOLN
INTRAOCULAR | Status: DC | PRN
  Administered 2021-07-25: 15 mL via INTRAOCULAR

## 2021-07-25 MED ORDER — EPINEPHRINE PF 1 MG/ML IJ SOLN
INTRAOCULAR | Status: DC | PRN
  Administered 2021-07-25: 500 mL

## 2021-07-25 MED ORDER — PHENYLEPHRINE HCL 2.5 % OP SOLN
1.0000 [drp] | OPHTHALMIC | Status: AC | PRN
  Administered 2021-07-25 (×3): 1 [drp] via OPHTHALMIC

## 2021-07-25 MED ORDER — POVIDONE-IODINE 5 % OP SOLN
OPHTHALMIC | Status: DC | PRN
  Administered 2021-07-25: 1 via OPHTHALMIC

## 2021-07-25 MED ORDER — STERILE WATER FOR IRRIGATION IR SOLN
Status: DC | PRN
  Administered 2021-07-25: 250 mL

## 2021-07-25 MED ORDER — SODIUM HYALURONATE 10 MG/ML IO SOLUTION
PREFILLED_SYRINGE | INTRAOCULAR | Status: DC | PRN
  Administered 2021-07-25: 0.85 mL via INTRAOCULAR

## 2021-07-25 MED ORDER — MIDAZOLAM HCL 2 MG/2ML IJ SOLN
INTRAMUSCULAR | Status: AC
  Filled 2021-07-25: qty 2

## 2021-07-25 MED ORDER — SODIUM HYALURONATE 23MG/ML IO SOSY
PREFILLED_SYRINGE | INTRAOCULAR | Status: DC | PRN
  Administered 2021-07-25: 0.6 mL via INTRAOCULAR

## 2021-07-25 SURGICAL SUPPLY — 11 items
CLOTH BEACON ORANGE TIMEOUT ST (SAFETY) ×1 IMPLANT
EYE SHIELD UNIVERSAL CLEAR (GAUZE/BANDAGES/DRESSINGS) ×1 IMPLANT
GLOVE SURG UNDER POLY LF SZ7 (GLOVE) ×2 IMPLANT
NDL HYPO 18GX1.5 BLUNT FILL (NEEDLE) IMPLANT
NEEDLE HYPO 18GX1.5 BLUNT FILL (NEEDLE) ×2 IMPLANT
PAD ARMBOARD 7.5X6 YLW CONV (MISCELLANEOUS) ×1 IMPLANT
SYR TB 1ML LL NO SAFETY (SYRINGE) ×1 IMPLANT
TAPE SURG TRANSPORE 1 IN (GAUZE/BANDAGES/DRESSINGS) IMPLANT
TAPE SURGICAL TRANSPORE 1 IN (GAUZE/BANDAGES/DRESSINGS) ×2
TECHNIS 1-PIECE IOL (Intraocular Lens) ×1 IMPLANT
WATER STERILE IRR 250ML POUR (IV SOLUTION) ×1 IMPLANT

## 2021-07-25 NOTE — Interval H&P Note (Signed)
History and Physical Interval Note:  07/25/2021 8:53 AM  Johnny Lucas  has presented today for surgery, with the diagnosis of Nuclear sclerotic cataract - Right eye.  The various methods of treatment have been discussed with the patient and family. After consideration of risks, benefits and other options for treatment, the patient has consented to  Procedure(s) with comments: CATARACT EXTRACTION PHACO AND INTRAOCULAR LENS PLACEMENT (IOC) (Right) - right as a surgical intervention.  The patient's history has been reviewed, patient examined, no change in status, stable for surgery.  I have reviewed the patient's chart and labs.  Questions were answered to the patient's satisfaction.     Baruch Goldmann

## 2021-07-25 NOTE — Op Note (Signed)
Date of procedure: 07/25/21  Pre-operative diagnosis:  Visually significant combined form age-related cataract, Right Eye (H25.811)  Post-operative diagnosis:  Visually significant combined form age-related cataract, Right Eye (H25.811)  Procedure: Removal of cataract via phacoemulsification and insertion of intra-ocular lens Wynetta Emery and [employer] DCB00  +21.5D into the capsular bag of the Right Eye  Attending surgeon: Gerda Diss. Mikai Meints, MD, MA  Anesthesia: MAC, Topical Akten  Complications: None  Estimated Blood Loss: <68m (minimal)  Specimens: None  Implants: As above  Indications:  Visually significant age-related cataract, Right Eye  Procedure:  The patient was seen and identified in the pre-operative area. The operative eye was identified and dilated.  The operative eye was marked.  Topical anesthesia was administered to the operative eye.     The patient was then to the operative suite and placed in the supine position.  A timeout was performed confirming the patient, procedure to be performed, and all other relevant information.   The patient's face was prepped and draped in the usual fashion for intra-ocular surgery.  A lid speculum was placed into the operative eye and the surgical microscope moved into place and focused.  A superotemporal paracentesis was created using a 20 gauge paracentesis blade.  Shugarcaine was injected into the anterior chamber.  Viscoelastic was injected into the anterior chamber.  A temporal clear-corneal main wound incision was created using a 2.418mmicrokeratome.  A continuous curvilinear capsulorrhexis was initiated using an irrigating cystitome and completed using capsulorrhexis forceps.  Hydrodissection and hydrodeliniation were performed.  Viscoelastic was injected into the anterior chamber.  A phacoemulsification handpiece and a chopper as a second instrument were used to remove the nucleus and epinucleus. The irrigation/aspiration handpiece was  used to remove any remaining cortical material.   The capsular bag was reinflated with viscoelastic, checked, and found to be intact.  The intraocular lens was inserted into the capsular bag.  The irrigation/aspiration handpiece was used to remove any remaining viscoelastic.  The clear corneal wound and paracentesis wounds were then hydrated and checked with Weck-Cels to be watertight.  The lid-speculum was removed.  The drape was removed.  The patient's face was cleaned with a wet and dry 4x4.   Maxitrol was instilled in the eye. A clear shield was taped over the eye. The patient was taken to the post-operative care unit in good condition, having tolerated the procedure well.  Post-Op Instructions: The patient will follow up at [facility] [place] a same day post-operative evaluation and will receive all other orders and instructions.

## 2021-07-25 NOTE — Anesthesia Preprocedure Evaluation (Signed)
Anesthesia Evaluation  Patient identified by MRN, date of birth, ID band Patient awake    Reviewed: Allergy & Precautions, H&P , NPO status , Patient's Chart, lab work & pertinent test results, reviewed documented beta blocker date and time   Airway Mallampati: II  TM Distance: >3 FB Neck ROM: full    Dental no notable dental hx.    Pulmonary neg pulmonary ROS,    Pulmonary exam normal breath sounds clear to auscultation       Cardiovascular Exercise Tolerance: Good hypertension, negative cardio ROS   Rhythm:regular Rate:Normal     Neuro/Psych PSYCHIATRIC DISORDERS Anxiety TIA Neuromuscular disease    GI/Hepatic Neg liver ROS, GERD  Medicated,  Endo/Other  negative endocrine ROS  Renal/GU   negative genitourinary   Musculoskeletal   Abdominal   Peds  Hematology  (+) Blood dyscrasia, anemia ,   Anesthesia Other Findings   Reproductive/Obstetrics negative OB ROS                             Anesthesia Physical Anesthesia Plan  ASA: 3  Anesthesia Plan: MAC   Post-op Pain Management:    Induction:   PONV Risk Score and Plan:   Airway Management Planned:   Additional Equipment:   Intra-op Plan:   Post-operative Plan:   Informed Consent: I have reviewed the patients History and Physical, chart, labs and discussed the procedure including the risks, benefits and alternatives for the proposed anesthesia with the patient or authorized representative who has indicated his/her understanding and acceptance.     Dental Advisory Given  Plan Discussed with: CRNA  Anesthesia Plan Comments:         Anesthesia Quick Evaluation

## 2021-07-25 NOTE — Transfer of Care (Signed)
Immediate Anesthesia Transfer of Care Note  Patient: Johnny Lucas  Procedure(s) Performed: CATARACT EXTRACTION PHACO AND INTRAOCULAR LENS PLACEMENT (IOC) (Right: Eye)  Patient Location: Short Stay  Anesthesia Type:MAC  Level of Consciousness: awake  Airway & Oxygen Therapy: Patient Spontanous Breathing  Post-op Assessment: Report given to RN  Post vital signs: Reviewed  Last Vitals:  Vitals Value Taken Time  BP    Temp    Pulse    Resp    SpO2      Last Pain:  Vitals:   07/25/21 0816  TempSrc: Oral  PainSc: 0-No pain      Patients Stated Pain Goal: 5 (11/65/79 0383)  Complications: No notable events documented.

## 2021-07-25 NOTE — Anesthesia Postprocedure Evaluation (Signed)
Anesthesia Post Note  Patient: Johnny Lucas  Procedure(s) Performed: CATARACT EXTRACTION PHACO AND INTRAOCULAR LENS PLACEMENT (IOC) (Right: Eye)  Patient location during evaluation: PACU Anesthesia Type: MAC Level of consciousness: awake and alert Pain management: pain level controlled Vital Signs Assessment: post-procedure vital signs reviewed and stable Respiratory status: spontaneous breathing Cardiovascular status: blood pressure returned to baseline and stable Postop Assessment: no apparent nausea or vomiting Anesthetic complications: no   No notable events documented.   Last Vitals:  Vitals:   07/25/21 0816  BP: 129/78  Pulse: 63  Resp: 14  Temp: 36.8 C  SpO2: 98%    Last Pain:  Vitals:   07/25/21 0816  TempSrc: Oral  PainSc: 0-No pain                 Deborra Phegley

## 2021-07-25 NOTE — Discharge Instructions (Signed)
Please discharge patient when stable, will follow up today with Dr. Cherryl Babin at the [facility] office immediately following discharge.  Leave shield in place until visit.  All paperwork with discharge instructions will be given at the office.   [facility] Address:  [address]  [place],  [address]  

## 2021-07-28 ENCOUNTER — Encounter (HOSPITAL_COMMUNITY): Payer: Self-pay | Admitting: Ophthalmology

## 2021-07-28 ENCOUNTER — Encounter (HOSPITAL_COMMUNITY): Payer: Self-pay | Admitting: Hematology

## 2021-08-04 ENCOUNTER — Other Ambulatory Visit (HOSPITAL_COMMUNITY): Payer: Self-pay | Admitting: Surgery

## 2021-08-04 DIAGNOSIS — C7951 Secondary malignant neoplasm of bone: Secondary | ICD-10-CM

## 2021-08-04 DIAGNOSIS — C9000 Multiple myeloma not having achieved remission: Secondary | ICD-10-CM

## 2021-08-04 NOTE — Progress Notes (Signed)
 [facility] [address] [place], [place] [address]   CLINIC:  Medical Oncology/Hematology  PCP:  Howard, Kevin, MD [address] / [place] [address] [phone]   REASON FOR VISIT:  Follow-up for relapsed multiple myeloma  PRIOR THERAPY:  1. RVD x 6 cycles from 03/01/2017 to 06/29/2017. 2. Stem cell transplant on 09/30/2017. 3. Carfilzomib and cyclophosphamide x 2 cycles from 02/14/2018 to 03/29/2018. 4. Carfilzomib x 22 cycles from 04/20/2018 to 01/23/2020.  NGS Results: not done  CURRENT THERAPY: Darzalex Faspro monthly; Pomalyst 2 mg 3/4 weeks; Xgeva monthly  BRIEF ONCOLOGIC HISTORY:  Oncology History  Breast cancer, male ([name]  12/31/2009 Initial Biopsy   Biopsy of L breast     12/31/2009 Pathology Results   Invasive ductal carcinoma, ER/PR+, HER 2 negative    12/31/2009 Imaging   Ultrasound showing a 2.43 x 1.85 x 3 cm hypoechoic spiculated mass in the 12 o clock L breast retroareolar region    01/01/2010 -  Anti-estrogen oral therapy   Tamoxifen 20 mg daily    01/06/2010 Imaging   Bone scan abnormal uptake in the diaphysis of the R humerus, abnormal in the R third, fifth and sixth ribs, lesion also noted in the sternum.    02/03/2010 Surgery   Rod placement and fixation of R humerus by Dr. Gramig    02/05/2010 - 02/18/2010 Radiation Therapy   30Gy in 10 fractions of 3 Gy per fraction to R pathologic fracture    03/11/2010 -  Chemotherapy   Denosumab monthly, now every 3 months. Started at [facility]     06/09/2016 Imaging   Three hypermetabolic osseous lesions in the sternum, left ilium and right ilium, as discussed above, likely represent osseous metastases. At this time, these are not recognizable on the CT images. 2. No extra skeletal metastatic disease identified in the neck, chest, abdomen or pelvis.    10/13/2016 Progression   PET shows various new and enlarging osseous metastatic lesions with no definite extra osseous metastatic disease  currently identified.     12/31/2016 Progression   1. Multifocal hypermetabolic osseous metastases throughout the axial and proximal appendicular skeleton, which are increased in size, number and metabolism since 10/13/2016 PET-CT. 2. 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 3. No additional sites of hypermetabolic metastatic disease. 4. Chronic right mastoid sinusitis. 5. Aortic atherosclerosis.  One vessel coronary atherosclerosis.    Multiple myeloma ([name]  02/12/2017 Bone Marrow Biopsy   The marrow was variably cellular with large peritrabecular aggregates of kappa restricted plasma cells (66% by aspirate, 30% by Cd138). Cytogenetics +11.    03/01/2017 - 06/29/2017 Chemotherapy   RVD     05/26/2017 Bone Marrow Biopsy   Performed at [facility]:  Plasma cell myeloma in a 30% cellular marrow with decreased trilineage hematopoiesis and 42% kappa light chain restricted plasma cells on the aspirate smears and large aggregates on the core biopsy.      07/12/2017 - 09/01/2017 Chemotherapy   3 cycles of carfizolmib/cyclophosphamide/dexamethasone      10/08/2017 Bone Marrow Transplant   Autotransplant at [facility]    02/28/2020 -  Chemotherapy    Patient is on Treatment Plan: DARATUMUMAB Q14D       Multiple myeloma not having achieved remission ([name]  06/29/2017 Initial Diagnosis   Multiple myeloma not having achieved remission ([name]    02/28/2020 -    Chemotherapy    Patient is on Treatment Plan: DARATUMUMAB Q14D         CANCER STAGING: Cancer Staging Multiple myeloma ([place]) Staging form: Plasma Cell Myeloma and Plasma Cell Disorders, AJCC 8th Edition - Clinical stage from 05/03/2017: Beta-2-microglobulin (mg/L): 3.5, Albumin (g/dL): 3.4, ISS: Stage II, High-risk cytogenetics: Absent, LDH: Not assessed - Signed by Baird Cancer, PA-C  on 05/03/2017   INTERVAL HISTORY:  Johnny Lucas, a 75 y.o. male, returns for routine follow-up and consideration for next cycle of chemotherapy. Johnny Lucas was last seen on 06/10/21.  Due for cycle #26 of  Darzalex Faspro today.   Overall, Johnny Lucas tells me Johnny Lucas has been feeling pretty well. Johnny Lucas reports diarrhea that has worsened over the past 2 weeks but Is helped by imodium.   Overall, Johnny Lucas feels ready for next cycle of chemo today.   REVIEW OF SYSTEMS:  Review of Systems  Constitutional:  Negative for appetite change (75%) and fatigue (75%).  Cardiovascular:  Positive for leg swelling. Palpitations: feet and ankle. Gastrointestinal:  Positive for diarrhea.  Neurological:  Positive for numbness (cramping in hands).  All other systems reviewed and are negative.  PAST MEDICAL/SURGICAL HISTORY:  Past Medical History:  Diagnosis Date   Anxiety    Bone metastases ([place]) 09/10/2016   Breast cancer ([place]) 2011   Stage IV breast cancer; radiation and tamoxifen   Breast cancer, male ([place])    Stage IV breast cancer; radiation and tamoxifen  Overview:  Left breast ca with mets bone  Overview:  METS TO BONE   GERD (gastroesophageal reflux disease)    Hypertension    Macular degeneration    Multiple myeloma ([place]) 02/18/2017   Peripheral neuropathy    Past Surgical History:  Procedure Laterality Date   BACK SURGERY     C2-C3 fusion   BREAST BIOPSY Left    cancer   CATARACT EXTRACTION W/PHACO Left 02/03/2019   Procedure: CATARACT EXTRACTION PHACO AND INTRAOCULAR LENS PLACEMENT ([place]);  Surgeon: Baruch Goldmann, MD;  Location: [facility];  Service: Ophthalmology;  Laterality: Left;  CDE: 15.89   CATARACT EXTRACTION W/PHACO Right 07/25/2021   Procedure: CATARACT EXTRACTION PHACO AND INTRAOCULAR LENS PLACEMENT ([facility]);  Surgeon: Baruch Goldmann, MD;  Location: [facility];  Service: Ophthalmology;  Laterality: Right;  CDE   26.71   HERNIA REPAIR     PORTACATH PLACEMENT Left 06/10/2018   Procedure: INSERTION  PORT-A-CATH;  Surgeon: Virl Cagey, MD;  Location: [facility];  Service: General;  Laterality: Left;   right arm surgery     bone cancer and rod placed    SOCIAL HISTORY:  Social History   Socioeconomic History   Marital status: Married    Spouse name: Not on file   Number of children: 3   Years of education: Not on file   Highest education level: Not on file  Occupational History   Not on file  Tobacco Use   Smoking status: Never   Smokeless tobacco: Former  Scientific laboratory technician Use: Never used  Substance and Sexual Activity   Alcohol use: Yes    Alcohol/week: 24.0 standard drinks    Types: 24 Cans of beer per week   Drug use: No   Sexual activity: Yes  Other Topics Concern   Not on file  Social History Narrative   Not on file   Social Determinants of Health   Financial Resource Strain: Low Risk    Difficulty of Paying Living Expenses: Not hard  at all  Food Insecurity: No Food Insecurity   Worried About Charity fundraiser in the Last Year: Never true   Ran Out of Food in the Last Year: Never true  Transportation Needs: No Transportation Needs   Lack of Transportation (Medical): No   Lack of Transportation (Non-Medical): No  Physical Activity: Inactive   Days of Exercise per Week: 0 days   Minutes of Exercise per Session: 0 min  Stress: Stress Concern Present   Feeling of Stress : To some extent  Social Connections: Moderately Integrated   Frequency of Communication with Friends and Family: More than three times a week   Frequency of Social Gatherings with Friends and Family: Three times a week   Attends Religious Services: 1 to 4 times per year   Active Member of Clubs or Organizations: No   Attends Archivist Meetings: Never   Marital Status: Married  Human resources officer Violence: Not At Risk   Fear of Current or Ex-Partner: No   Emotionally Abused: No   Physically Abused: No   Sexually Abused: No    FAMILY HISTORY:  Family History  Problem  Relation Age of Onset   Stroke Mother    Cancer Maternal Aunt        cancer NOS; died in her 27s   Lung cancer Maternal Uncle        smoker    CURRENT MEDICATIONS:  Current Outpatient Medications  Medication Sig Dispense Refill   acetaminophen (TYLENOL) 500 MG tablet Take 1,000 mg by mouth every 6 (six) hours as needed for moderate pain.     acyclovir (ZOVIRAX) 800 MG tablet TAKE 1 TABLET TWICE DAILY 180 tablet 3   albuterol (VENTOLIN HFA) 108 (90 Base) MCG/ACT inhaler Inhale 2 puffs into the lungs every 6 (six) hours as needed for wheezing or shortness of breath.     ALPRAZolam (XANAX) 0.5 MG tablet TAKE 1 TABLET BY MOUTH AT BEDTIME AS NEEDED FOR anxiety / SLEEP 30 tablet 5   aspirin EC 81 MG tablet Take 81 mg by mouth daily.      denosumab (XGEVA) 120 MG/1.7ML SOLN injection Inject 120 mg into the skin every 30 (thirty) days.      dexamethasone (DECADRON) 4 MG tablet Take 20 mg by mouth every Monday.     diphenhydrAMINE (BENADRYL) 25 MG tablet Take 25 mg by mouth daily as needed for allergies.     folic acid (FOLVITE) 1 MG tablet TAKE 1 TABLET BY MOUTH DAILY (Patient taking differently: Take 1 mg by mouth daily.) 90 tablet 2   gabapentin (NEURONTIN) 300 MG capsule Take 1 capsule (300 mg total) by mouth 3 (three) times daily. (Patient taking differently: Take 300 mg by mouth 2 (two) times daily.) 180 capsule 2   loratadine (CLARITIN) 10 MG tablet Take 1 tablet (10 mg total) by mouth daily. 90 tablet 1   metoprolol succinate (TOPROL-XL) 100 MG 24 hr tablet Take 100 mg by mouth daily.     Multiple Vitamin (MULTIVITAMIN WITH MINERALS) TABS tablet Take 1 tablet by mouth daily.      ondansetron (ZOFRAN) 8 MG tablet TAKE 1 TABLET BY MOUTH EVERY 8 HOURS AS NEEDED FOR NAUSEA AND VOMITING 30 tablet 2   pantoprazole (PROTONIX) 40 MG tablet Take 40 mg by mouth every other day.      polyethylene glycol (MIRALAX / GLYCOLAX) packet Take 17 g by mouth daily as needed for mild constipation. 14 each 0    POMALYST 2  MG capsule TAKE 1 CAPSULE BY MOUTH DAILY FOR 21 DAYS ON THEN 7 DAYS OFF 21 capsule 0   tamoxifen (NOLVADEX) 20 MG tablet TAKE 1 TABLET EVERY DAY 90 tablet 3   No current facility-administered medications for this visit.   Facility-Administered Medications Ordered in Other Visits  Medication Dose Route Frequency Provider Last Rate Last Admin   ondansetron (ZOFRAN) 8 mg in sodium chloride 0.9 % 50 mL IVPB  8 mg Intravenous Once Lockamy, Randi L, NP-C       sodium chloride flush (NS) 0.9 % injection 10 mL  10 mL Intracatheter Once PRN Derek Jack, MD        ALLERGIES:  Allergies  Allergen Reactions   Cefepime     Suspected severe thrombocytopenia is a result of cefepime induced antigen platelet destruction    PHYSICAL EXAM:  Performance status (ECOG): 1 - Symptomatic but completely ambulatory  There were no vitals filed for this visit. Wt Readings from Last 3 Encounters:  07/22/21 210 lb (95.3 kg)  07/08/21 217 lb 3.2 oz (98.5 kg)  06/10/21 216 lb 12.8 oz (98.3 kg)   Physical Exam Vitals reviewed.  Constitutional:      Appearance: Normal appearance.  Cardiovascular:     Rate and Rhythm: Normal rate and regular rhythm.     Pulses: Normal pulses.     Heart sounds: Normal heart sounds.  Pulmonary:     Effort: Pulmonary effort is normal.     Breath sounds: Normal breath sounds.  Neurological:     General: No focal deficit present.     Mental Status: Johnny Lucas is alert and oriented to person, place, and time.  Psychiatric:        Mood and Affect: Mood normal.        Behavior: Behavior normal.    LABORATORY DATA:  I have reviewed the labs as listed.  CBC Latest Ref Rng & Units 07/08/2021 06/10/2021 05/13/2021  WBC 4.0 - 10.5 K/uL 4.1 4.9 4.2  Hemoglobin 13.0 - 17.0 g/dL 10.4(L) 10.9(L) 11.0(L)  Hematocrit 39.0 - 52.0 % 32.1(L) 33.0(L) 33.8(L)  Platelets 150 - 400 K/uL 141(L) 148(L) 158   CMP Latest Ref Rng & Units 07/08/2021 06/10/2021 05/13/2021  Glucose 70 - 99  mg/dL 89 95 83  BUN 8 - 23 mg/dL $Remove'15 15 14  'GaGMOPE$ Creatinine 0.61 - 1.24 mg/dL 1.57(H) 1.29(H) 1.32(H)  Sodium 135 - 145 mmol/L 137 137 138  Potassium 3.5 - 5.1 mmol/L 3.5 4.4 4.8  Chloride 98 - 111 mmol/L 105 107 105  CO2 22 - 32 mmol/L $RemoveB'27 24 27  'QFaBsveB$ Calcium 8.9 - 10.3 mg/dL 8.1(L) 8.1(L) 8.4(L)  Total Protein 6.5 - 8.1 g/dL 5.3(L) 5.0(L) 5.3(L)  Total Bilirubin 0.3 - 1.2 mg/dL 0.6 0.4 0.6  Alkaline Phos 38 - 126 U/L 30(L) 29(L) 28(L)  AST 15 - 41 U/L $Remo'16 15 16  'LrpNT$ ALT 0 - 44 U/L $Remo'15 16 15    'LPYpO$ DIAGNOSTIC IMAGING:  I have independently reviewed the scans and discussed with the patient. No results found.   ASSESSMENT:  1.  Relapsed multiple myeloma: -Autologous stem cell transplant on 10/08/2017 -Post transplant consolidation with CyCarD for 2 cycles with M spike undetectable. -Maintenance therapy with carfilzomib 70 mg per metered square days 1 and 15 every 28 days from 04/20/2018, dose reduced to 35 mg per metered square on 01/05/2019, titrated up to 56 mg per metered square on 01/09/2020. 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 -PET scan on  02/18/2020 showed new left supraclavicular lymph node at the thoracic inlet, SUV 14.2.  Multifocal osseous lesions largely improved, though residual lesions noted including right acromion, left acromion, thyroid cartilage, right sternum.  Additional lesions throughout the sternum, bilateral ribs, thoracolumbar spine, bilateral pelvis have resolved. -BMBX on 02/14/2020 shows plasma cell myeloma, 60-70% of cells.  FISH for myeloma was negative.  Cytogenetics was normal. -4 cycles of daratumumab, bortezomib and dexamethasone from 02/28/2020 through 04/29/2020. -Daratumumab, pomalidomide and dexamethasone started on 05/28/2020.  Johnny Lucas is receiving daratumumab every 2 weeks, Pomalyst 3 mg days 1-21, dexamethasone 20 mg weekly. -Pomalidomide dose reduced to 2 mg days 1-21 around the first week of July 2021, due to severe  weakness.   PLAN:  1.  Relapsed IgG kappa multiple myeloma: - Johnny Lucas is taking pomalidomide 2 mg 3 weeks on/1 week off along with dexamethasone 20 mg weekly. - I have reviewed myeloma labs from 07/08/2021.  M spike is stable at 0.1 g.  Kappa light chains are 1.4 with ratio of more than 0.93.  Immunofixation shows IgG kappa. - Reviewed labs today which showed creatinine slightly improved to 1.39.  Calcium is 8.9.  LFTs are grossly normal.  White count is slightly low at 2.8 and platelet count 144.  Johnny Lucas started pomalidomide 1 week ago.  ANC is 1100. - Johnny Lucas will proceed with Darzalex every 4 weeks. - RTC 8 weeks for follow-up with repeat myeloma labs 2 weeks prior.   2.  Myeloma bone disease: - Continue denosumab monthly.  Does not report any side effects. - Continue calcium supplements.   3.  Macrocytic anemia: - Hemoglobin is staying between 10-12. - No growth factor needed unless hemoglobin drops below 9.   4.  ID prophylaxis: - Continue acyclovir twice daily.   5.  Peripheral neuropathy: - Continue gabapentin 300 mg 3 times daily.   6.  History of breast cancer: - Continue tamoxifen daily.  No issues reported.  7.  Diarrhea: - She has on and off diarrhea most likely from Pomalyst. - Recommend taking 1 tablet of Imodium daily as needed.  If Johnny Lucas takes more, Johnny Lucas gets constipated.   Orders placed this encounter:  No orders of the defined types were placed in this encounter.    Derek Jack, MD [place] [phone]   I, Thana Ates, am acting as a scribe for Dr. Derek Jack.  I, Derek Jack MD, have reviewed the above documentation for accuracy and completeness, and I agree with the above.

## 2021-08-05 ENCOUNTER — Encounter (HOSPITAL_COMMUNITY): Payer: Self-pay | Admitting: Hematology

## 2021-08-05 ENCOUNTER — Inpatient Hospital Stay (HOSPITAL_BASED_OUTPATIENT_CLINIC_OR_DEPARTMENT_OTHER): Payer: Medicare Other | Admitting: Hematology

## 2021-08-05 ENCOUNTER — Other Ambulatory Visit: Payer: Self-pay

## 2021-08-05 ENCOUNTER — Inpatient Hospital Stay (HOSPITAL_COMMUNITY): Payer: Medicare Other | Attending: Hematology

## 2021-08-05 ENCOUNTER — Inpatient Hospital Stay (HOSPITAL_COMMUNITY): Payer: Medicare Other

## 2021-08-05 VITALS — BP 121/63 | HR 62 | Temp 97.9°F | Resp 18 | Wt 218.8 lb

## 2021-08-05 DIAGNOSIS — R197 Diarrhea, unspecified: Secondary | ICD-10-CM | POA: Diagnosis not present

## 2021-08-05 DIAGNOSIS — Z79899 Other long term (current) drug therapy: Secondary | ICD-10-CM | POA: Insufficient documentation

## 2021-08-05 DIAGNOSIS — C9002 Multiple myeloma in relapse: Secondary | ICD-10-CM

## 2021-08-05 DIAGNOSIS — C9000 Multiple myeloma not having achieved remission: Secondary | ICD-10-CM

## 2021-08-05 DIAGNOSIS — Z9221 Personal history of antineoplastic chemotherapy: Secondary | ICD-10-CM | POA: Diagnosis not present

## 2021-08-05 DIAGNOSIS — C50921 Malignant neoplasm of unspecified site of right male breast: Secondary | ICD-10-CM | POA: Diagnosis not present

## 2021-08-05 DIAGNOSIS — Z79810 Long term (current) use of selective estrogen receptor modulators (SERMs): Secondary | ICD-10-CM | POA: Diagnosis not present

## 2021-08-05 DIAGNOSIS — K219 Gastro-esophageal reflux disease without esophagitis: Secondary | ICD-10-CM | POA: Insufficient documentation

## 2021-08-05 DIAGNOSIS — Z7982 Long term (current) use of aspirin: Secondary | ICD-10-CM | POA: Diagnosis not present

## 2021-08-05 DIAGNOSIS — Z923 Personal history of irradiation: Secondary | ICD-10-CM | POA: Diagnosis not present

## 2021-08-05 DIAGNOSIS — Z9484 Stem cells transplant status: Secondary | ICD-10-CM | POA: Diagnosis not present

## 2021-08-05 DIAGNOSIS — C9001 Multiple myeloma in remission: Secondary | ICD-10-CM | POA: Insufficient documentation

## 2021-08-05 DIAGNOSIS — Z87891 Personal history of nicotine dependence: Secondary | ICD-10-CM | POA: Diagnosis not present

## 2021-08-05 DIAGNOSIS — C7951 Secondary malignant neoplasm of bone: Secondary | ICD-10-CM

## 2021-08-05 DIAGNOSIS — Z170 Estrogen receptor positive status [ER+]: Secondary | ICD-10-CM | POA: Insufficient documentation

## 2021-08-05 DIAGNOSIS — Z5111 Encounter for antineoplastic chemotherapy: Secondary | ICD-10-CM | POA: Diagnosis not present

## 2021-08-05 DIAGNOSIS — G629 Polyneuropathy, unspecified: Secondary | ICD-10-CM | POA: Diagnosis not present

## 2021-08-05 LAB — LACTATE DEHYDROGENASE: LDH: 102 U/L (ref 98–192)

## 2021-08-05 LAB — COMPREHENSIVE METABOLIC PANEL
ALT: 15 U/L (ref 0–44)
AST: 13 U/L — ABNORMAL LOW (ref 15–41)
Albumin: 3.2 g/dL — ABNORMAL LOW (ref 3.5–5.0)
Alkaline Phosphatase: 29 U/L — ABNORMAL LOW (ref 38–126)
Anion gap: 5 (ref 5–15)
BUN: 16 mg/dL (ref 8–23)
CO2: 26 mmol/L (ref 22–32)
Calcium: 8.9 mg/dL (ref 8.9–10.3)
Chloride: 107 mmol/L (ref 98–111)
Creatinine, Ser: 1.39 mg/dL — ABNORMAL HIGH (ref 0.61–1.24)
GFR, Estimated: 54 mL/min — ABNORMAL LOW (ref >60)
Glucose, Bld: 110 mg/dL — ABNORMAL HIGH (ref 70–99)
Potassium: 4.1 mmol/L (ref 3.5–5.1)
Sodium: 138 mmol/L (ref 135–145)
Total Bilirubin: 0.5 mg/dL (ref 0.3–1.2)
Total Protein: 5.2 g/dL — ABNORMAL LOW (ref 6.5–8.1)

## 2021-08-05 LAB — CBC WITH DIFFERENTIAL/PLATELET
Band Neutrophils: 2 %
Basophils Absolute: 0 10*3/uL (ref 0.0–0.1)
Basophils Relative: 1 %
Eosinophils Absolute: 0.4 10*3/uL (ref 0.0–0.5)
Eosinophils Relative: 16 %
HCT: 33.3 % — ABNORMAL LOW (ref 39.0–52.0)
Hemoglobin: 10.8 g/dL — ABNORMAL LOW (ref 13.0–17.0)
Lymphocytes Relative: 22 %
Lymphs Abs: 0.6 10*3/uL — ABNORMAL LOW (ref 0.7–4.0)
MCH: 35.8 pg — ABNORMAL HIGH (ref 26.0–34.0)
MCHC: 32.4 g/dL (ref 30.0–36.0)
MCV: 110.3 fL — ABNORMAL HIGH (ref 80.0–100.0)
Monocytes Absolute: 0.6 10*3/uL (ref 0.1–1.0)
Monocytes Relative: 23 %
Neutro Abs: 1.1 10*3/uL — ABNORMAL LOW (ref 1.7–7.7)
Neutrophils Relative %: 36 %
Platelets: 144 10*3/uL — ABNORMAL LOW (ref 150–400)
RBC: 3.02 MIL/uL — ABNORMAL LOW (ref 4.22–5.81)
RDW: 14.4 % (ref 11.5–15.5)
WBC: 2.8 10*3/uL — ABNORMAL LOW (ref 4.0–10.5)
nRBC: 0 % (ref 0.0–0.2)

## 2021-08-05 LAB — MAGNESIUM: Magnesium: 1.7 mg/dL (ref 1.7–2.4)

## 2021-08-05 MED ORDER — DIPHENHYDRAMINE HCL 25 MG PO CAPS
ORAL_CAPSULE | ORAL | Status: AC
  Filled 2021-08-05: qty 2

## 2021-08-05 MED ORDER — DARATUMUMAB-HYALURONIDASE-FIHJ 1800-30000 MG-UT/15ML ~~LOC~~ SOLN
1800.0000 mg | Freq: Once | SUBCUTANEOUS | Status: AC
  Administered 2021-08-05: 1800 mg via SUBCUTANEOUS
  Filled 2021-08-05: qty 15

## 2021-08-05 MED ORDER — DEXAMETHASONE 4 MG PO TABS
ORAL_TABLET | ORAL | Status: AC
  Filled 2021-08-05: qty 5

## 2021-08-05 MED ORDER — ACETAMINOPHEN 325 MG PO TABS
ORAL_TABLET | ORAL | Status: AC
  Filled 2021-08-05: qty 2

## 2021-08-05 MED ORDER — ACETAMINOPHEN 325 MG PO TABS: 650.0000 mg | ORAL_TABLET | Freq: Once | ORAL | Status: DC

## 2021-08-05 MED ORDER — DEXAMETHASONE 4 MG PO TABS: 20.0000 mg | ORAL_TABLET | Freq: Once | ORAL | Status: DC

## 2021-08-05 MED ORDER — DIPHENHYDRAMINE HCL 25 MG PO CAPS: 50.0000 mg | ORAL_CAPSULE | Freq: Once | ORAL | Status: DC

## 2021-08-05 NOTE — Progress Notes (Signed)
Patient tolerated Daratumumab injection with no complaints voiced. See MAR for details. Lab reviewed. Injection site clean and dry with no bruising or swelling noted at site. Band aid applied. Vss with discharge and left in satisfactory condition with nos/s of distress noted.  

## 2021-08-05 NOTE — Patient Instructions (Signed)
[place] at [facility] Discharge Instructions  You were seen today by Dr. Delton Coombes. He went over your recent results, and you received your treatment. Dr. Delton Coombes will see you back in 2 months for labs and follow up.   Thank you for choosing [facility] Lucas at [facility] to provide your oncology and hematology care.  To afford each patient quality time with our provider, please arrive at least 15 minutes before your scheduled appointment time.   If you have a lab appointment with the [facility] please come in thru the Main Entrance and check in at the main information desk  You need to re-schedule your appointment should you arrive 10 or more minutes late.  We strive to give you quality time with our providers, and arriving late affects you and other patients whose appointments are after yours.  Also, if you no show three or more times for appointments you may be dismissed from the clinic at the providers discretion.     Again, thank you for choosing [facility].  Our hope is that these requests will decrease the amount of time that you wait before being seen by our physicians.       _____________________________________________________________  Should you have questions after your visit to [facility], please contact our office at [phone] between the hours of 8:00 a.m. and 4:30 p.m.  Voicemails left after 4:00 p.m. will not be returned until the following business day.  For prescription refill requests, have your pharmacy contact our office and allow 72 hours.    Cancer Center Support Programs:   > Cancer Support Group  2nd Tuesday of the month 1pm-2pm, Journey Room

## 2021-08-05 NOTE — Patient Instructions (Signed)
[name]  Discharge Instructions: Thank you for choosing [facility] to provide your oncology and hematology care.  If you have a lab appointment with the [facility], please come in thru the Main Entrance and check in at the main information desk.  Wear comfortable clothing and clothing appropriate for easy access to any Portacath or PICC line.   We strive to give you quality time with your provider. You may need to reschedule your appointment if you arrive late (15 or more minutes).  Arriving late affects you and other patients whose appointments are after yours.  Also, if you miss three or more appointments without notifying the office, you may be dismissed from the clinic at the provider's discretion.      For prescription refill requests, have your pharmacy contact our office and allow 72 hours for refills to be completed.    Today you received the following chemotherapy and/or immunotherapy agents Daratumumab, return as scheduled.   To help prevent nausea and vomiting after your treatment, we encourage you to take your nausea medication as directed.  BELOW ARE SYMPTOMS THAT SHOULD BE REPORTED IMMEDIATELY: *FEVER GREATER THAN 100.4 F (38 C) OR HIGHER *CHILLS OR SWEATING *NAUSEA AND VOMITING THAT IS NOT CONTROLLED WITH YOUR NAUSEA MEDICATION *UNUSUAL SHORTNESS OF BREATH *UNUSUAL BRUISING OR BLEEDING *URINARY PROBLEMS (pain or burning when urinating, or frequent urination) *BOWEL PROBLEMS (unusual diarrhea, constipation, pain near the anus) TENDERNESS IN MOUTH AND THROAT WITH OR WITHOUT PRESENCE OF ULCERS (sore throat, sores in mouth, or a toothache) UNUSUAL RASH, SWELLING OR PAIN  UNUSUAL VAGINAL DISCHARGE OR ITCHING   Items with * indicate a potential emergency and should be followed up as soon as possible or go to the Emergency Department if any problems should occur.  Please show the CHEMOTHERAPY ALERT CARD or IMMUNOTHERAPY ALERT CARD at check-in to  the Emergency Department and triage nurse.  Should you have questions after your visit or need to cancel or reschedule your appointment, please contact [facility] [phone]  and follow the prompts.  Office hours are 8:00 a.m. to 4:30 p.m. Monday - Friday. Please note that voicemails left after 4:00 p.m. may not be returned until the following business day.  We are closed weekends and major holidays. You have access to a nurse at all times for urgent questions. Please call the main number to the clinic [phone] and follow the prompts.  For any non-urgent questions, you may also contact your provider using MyChart. We now offer e-Visits for anyone 59 and older to request care online for non-urgent symptoms. For details visit [email or web address].   Also download the MyChart app! Go to the app store, search "MyChart", open the app, select , and log in with your MyChart username and password.  Due to Covid, a mask is required upon entering the hospital/clinic. If you do not have a mask, one will be given to you upon arrival. For doctor visits, patients may have 1 support person aged 7 or older with them. For treatment visits, patients cannot have anyone with them due to current Covid guidelines and our immunocompromised population.

## 2021-08-05 NOTE — Progress Notes (Signed)
Pt is taking Tamoxifen and Pomalyst as prescribed with no side effects.

## 2021-08-06 ENCOUNTER — Encounter (HOSPITAL_COMMUNITY): Payer: Self-pay

## 2021-08-06 ENCOUNTER — Inpatient Hospital Stay (HOSPITAL_COMMUNITY): Payer: Medicare Other

## 2021-08-06 VITALS — BP 122/74 | HR 74 | Temp 99.1°F | Resp 18

## 2021-08-06 DIAGNOSIS — Z5111 Encounter for antineoplastic chemotherapy: Secondary | ICD-10-CM | POA: Diagnosis not present

## 2021-08-06 DIAGNOSIS — C9001 Multiple myeloma in remission: Secondary | ICD-10-CM | POA: Diagnosis not present

## 2021-08-06 DIAGNOSIS — C7951 Secondary malignant neoplasm of bone: Secondary | ICD-10-CM

## 2021-08-06 DIAGNOSIS — Z9484 Stem cells transplant status: Secondary | ICD-10-CM | POA: Diagnosis not present

## 2021-08-06 DIAGNOSIS — C9000 Multiple myeloma not having achieved remission: Secondary | ICD-10-CM

## 2021-08-06 DIAGNOSIS — C50921 Malignant neoplasm of unspecified site of right male breast: Secondary | ICD-10-CM | POA: Diagnosis not present

## 2021-08-06 DIAGNOSIS — Z170 Estrogen receptor positive status [ER+]: Secondary | ICD-10-CM | POA: Diagnosis not present

## 2021-08-06 DIAGNOSIS — Z79810 Long term (current) use of selective estrogen receptor modulators (SERMs): Secondary | ICD-10-CM | POA: Diagnosis not present

## 2021-08-06 LAB — KAPPA/LAMBDA LIGHT CHAINS
Kappa free light chain: 3.3 mg/L (ref 3.3–19.4)
Kappa, lambda light chain ratio: 1.43 (ref 0.26–1.65)
Lambda free light chains: 2.3 mg/L — ABNORMAL LOW (ref 5.7–26.3)

## 2021-08-06 MED ORDER — DENOSUMAB 120 MG/1.7ML ~~LOC~~ SOLN
120.0000 mg | Freq: Once | SUBCUTANEOUS | Status: AC
  Administered 2021-08-06: 120 mg via SUBCUTANEOUS

## 2021-08-06 MED ORDER — DENOSUMAB 120 MG/1.7ML ~~LOC~~ SOLN
SUBCUTANEOUS | Status: AC
  Filled 2021-08-06: qty 1.7

## 2021-08-06 NOTE — Progress Notes (Signed)
Patient tolerated Xgeva injection with no complaints voiced. Site clean and dry with no bruising or swelling noted at site. See MAR for details. Band aid applied.  Patient stable during and after injection. VSS with discharge and left in satisfactory condition with no s/s of distress noted.

## 2021-08-06 NOTE — Patient Instructions (Signed)
[facility]  Discharge Instructions: °Thank you for choosing [facility] to provide your oncology and hematology care.  °If you have a lab appointment with the Cancer Center, please come in thru the Main Entrance and check in at the main information desk. ° °Wear comfortable clothing and clothing appropriate for easy access to any Portacath or PICC line.  ° °We strive to give you quality time with your provider. You may need to reschedule your appointment if you arrive late (15 or more minutes).  Arriving late affects you and other patients whose appointments are after yours.  Also, if you miss three or more appointments without notifying the office, you may be dismissed from the clinic at the provider’s discretion.    °  °For prescription refill requests, have your pharmacy contact our office and allow 72 hours for refills to be completed.   ° °Today you received the following chemotherapy and/or immunotherapy agents Xgeva, return as scheduled. °To help prevent nausea and vomiting after your treatment, we encourage you to take your nausea medication as directed. ° °BELOW ARE SYMPTOMS THAT SHOULD BE REPORTED IMMEDIATELY: °*FEVER GREATER THAN 100.4 F (38 °C) OR HIGHER °*CHILLS OR SWEATING °*NAUSEA AND VOMITING THAT IS NOT CONTROLLED WITH YOUR NAUSEA MEDICATION °*UNUSUAL SHORTNESS OF BREATH °*UNUSUAL BRUISING OR BLEEDING °*URINARY PROBLEMS (pain or burning when urinating, or frequent urination) °*BOWEL PROBLEMS (unusual diarrhea, constipation, pain near the anus) °TENDERNESS IN MOUTH AND THROAT WITH OR WITHOUT PRESENCE OF ULCERS (sore throat, sores in mouth, or a toothache) °UNUSUAL RASH, SWELLING OR PAIN  °UNUSUAL VAGINAL DISCHARGE OR ITCHING  ° °Items with * indicate a potential emergency and should be followed up as soon as possible or go to the Emergency Department if any problems should occur. ° °Please show the CHEMOTHERAPY ALERT CARD or IMMUNOTHERAPY ALERT CARD at check-in to the  Emergency Department and triage nurse. ° °Should you have questions after your visit or need to cancel or reschedule your appointment, please contact [facility] [phone]  and follow the prompts.  Office hours are 8:00 a.m. to 4:30 p.m. Monday - Friday. Please note that voicemails left after 4:00 p.m. may not be returned until the following business day.  We are closed weekends and major holidays. You have access to a nurse at all times for urgent questions. Please call the main number to the clinic [phone] and follow the prompts. ° °For any non-urgent questions, you may also contact your provider using MyChart. We now offer e-Visits for anyone 18 and older to request care online for non-urgent symptoms. For details visit [email or web address]. °  °Also download the MyChart app! Go to the app store, search "MyChart", open the app, select [facility], and log in with your MyChart username and password. ° °Due to Covid, a mask is required upon entering the hospital/clinic. If you do not have a mask, one will be given to you upon arrival. For doctor visits, patients may have 1 support person aged 18 or older with them. For treatment visits, patients cannot have anyone with them due to current Covid guidelines and our immunocompromised population.  °

## 2021-08-07 ENCOUNTER — Other Ambulatory Visit (HOSPITAL_COMMUNITY): Payer: Self-pay | Admitting: Hematology

## 2021-08-07 DIAGNOSIS — I10 Essential (primary) hypertension: Secondary | ICD-10-CM

## 2021-08-07 DIAGNOSIS — G4700 Insomnia, unspecified: Secondary | ICD-10-CM

## 2021-08-07 LAB — PROTEIN ELECTROPHORESIS, SERUM
A/G Ratio: 1.8 — ABNORMAL HIGH (ref 0.7–1.7)
Albumin ELP: 3.1 g/dL (ref 2.9–4.4)
Alpha-1-Globulin: 0.2 g/dL (ref 0.0–0.4)
Alpha-2-Globulin: 0.6 g/dL (ref 0.4–1.0)
Beta Globulin: 0.7 g/dL (ref 0.7–1.3)
Gamma Globulin: 0.1 g/dL — ABNORMAL LOW (ref 0.4–1.8)
Globulin, Total: 1.7 g/dL — ABNORMAL LOW (ref 2.2–3.9)
M-Spike, %: 0.1 g/dL — ABNORMAL HIGH
Total Protein ELP: 4.8 g/dL — ABNORMAL LOW (ref 6.0–8.5)

## 2021-08-11 ENCOUNTER — Encounter (HOSPITAL_COMMUNITY): Payer: Self-pay | Admitting: Hematology

## 2021-08-11 LAB — IMMUNOFIXATION ELECTROPHORESIS
IgA: <5 mg/dL — ABNORMAL LOW (ref 61–437)
IgG (Immunoglobin G), Serum: 125 mg/dL — ABNORMAL LOW (ref 603–1613)
IgM (Immunoglobulin M), Srm: 5 mg/dL — ABNORMAL LOW (ref 15–143)
Total Protein ELP: 4.8 g/dL — ABNORMAL LOW (ref 6.0–8.5)

## 2021-08-11 NOTE — Telephone Encounter (Signed)
Chart reviewed. Pomalyst refilled per Dr. Delton Coombes last office visit.

## 2021-08-12 DIAGNOSIS — Z20828 Contact with and (suspected) exposure to other viral communicable diseases: Secondary | ICD-10-CM | POA: Diagnosis not present

## 2021-08-12 DIAGNOSIS — J209 Acute bronchitis, unspecified: Secondary | ICD-10-CM | POA: Diagnosis not present

## 2021-08-12 DIAGNOSIS — R197 Diarrhea, unspecified: Secondary | ICD-10-CM | POA: Diagnosis not present

## 2021-08-21 ENCOUNTER — Other Ambulatory Visit (HOSPITAL_COMMUNITY): Payer: Self-pay | Admitting: Hematology

## 2021-09-02 ENCOUNTER — Inpatient Hospital Stay (HOSPITAL_COMMUNITY): Payer: Medicare Other | Attending: Hematology

## 2021-09-02 ENCOUNTER — Other Ambulatory Visit: Payer: Self-pay

## 2021-09-02 ENCOUNTER — Encounter (HOSPITAL_COMMUNITY): Payer: Self-pay

## 2021-09-02 ENCOUNTER — Inpatient Hospital Stay (HOSPITAL_COMMUNITY): Payer: Medicare Other

## 2021-09-02 VITALS — BP 135/72 | HR 54 | Temp 98.1°F | Resp 18 | Wt 217.2 lb

## 2021-09-02 DIAGNOSIS — Z5112 Encounter for antineoplastic immunotherapy: Secondary | ICD-10-CM | POA: Diagnosis not present

## 2021-09-02 DIAGNOSIS — C9000 Multiple myeloma not having achieved remission: Secondary | ICD-10-CM

## 2021-09-02 LAB — CBC WITH DIFFERENTIAL/PLATELET
Abs Immature Granulocytes: 0.01 10*3/uL (ref 0.00–0.07)
Basophils Absolute: 0.1 10*3/uL (ref 0.0–0.1)
Basophils Relative: 3 %
Eosinophils Absolute: 0.6 10*3/uL — ABNORMAL HIGH (ref 0.0–0.5)
Eosinophils Relative: 13 %
HCT: 32.5 % — ABNORMAL LOW (ref 39.0–52.0)
Hemoglobin: 10.7 g/dL — ABNORMAL LOW (ref 13.0–17.0)
Immature Granulocytes: 0 %
Lymphocytes Relative: 23 %
Lymphs Abs: 1.1 10*3/uL (ref 0.7–4.0)
MCH: 36.5 pg — ABNORMAL HIGH (ref 26.0–34.0)
MCHC: 32.9 g/dL (ref 30.0–36.0)
MCV: 110.9 fL — ABNORMAL HIGH (ref 80.0–100.0)
Monocytes Absolute: 0.9 10*3/uL (ref 0.1–1.0)
Monocytes Relative: 19 %
Neutro Abs: 2 10*3/uL (ref 1.7–7.7)
Neutrophils Relative %: 42 %
Platelets: 152 10*3/uL (ref 150–400)
RBC: 2.93 MIL/uL — ABNORMAL LOW (ref 4.22–5.81)
RDW: 14.7 % (ref 11.5–15.5)
WBC: 4.7 10*3/uL (ref 4.0–10.5)
nRBC: 0 % (ref 0.0–0.2)

## 2021-09-02 LAB — MAGNESIUM: Magnesium: 1.9 mg/dL (ref 1.7–2.4)

## 2021-09-02 LAB — COMPREHENSIVE METABOLIC PANEL
ALT: 15 U/L (ref 0–44)
AST: 15 U/L (ref 15–41)
Albumin: 3.1 g/dL — ABNORMAL LOW (ref 3.5–5.0)
Alkaline Phosphatase: 26 U/L — ABNORMAL LOW (ref 38–126)
Anion gap: 3 — ABNORMAL LOW (ref 5–15)
BUN: 15 mg/dL (ref 8–23)
CO2: 28 mmol/L (ref 22–32)
Calcium: 8.5 mg/dL — ABNORMAL LOW (ref 8.9–10.3)
Chloride: 107 mmol/L (ref 98–111)
Creatinine, Ser: 1.32 mg/dL — ABNORMAL HIGH (ref 0.61–1.24)
GFR, Estimated: 57 mL/min — ABNORMAL LOW (ref >60)
Glucose, Bld: 115 mg/dL — ABNORMAL HIGH (ref 70–99)
Potassium: 4.4 mmol/L (ref 3.5–5.1)
Sodium: 138 mmol/L (ref 135–145)
Total Bilirubin: 0.4 mg/dL (ref 0.3–1.2)
Total Protein: 4.8 g/dL — ABNORMAL LOW (ref 6.5–8.1)

## 2021-09-02 MED ORDER — DIPHENHYDRAMINE HCL 25 MG PO CAPS
50.0000 mg | ORAL_CAPSULE | Freq: Once | ORAL | Status: DC
  Filled 2021-09-02: qty 2

## 2021-09-02 MED ORDER — ACETAMINOPHEN 325 MG PO TABS
650.0000 mg | ORAL_TABLET | Freq: Once | ORAL | Status: DC
  Filled 2021-09-02: qty 2

## 2021-09-02 MED ORDER — DARATUMUMAB-HYALURONIDASE-FIHJ 1800-30000 MG-UT/15ML ~~LOC~~ SOLN
1800.0000 mg | Freq: Once | SUBCUTANEOUS | Status: AC
  Administered 2021-09-02: 1800 mg via SUBCUTANEOUS
  Filled 2021-09-02: qty 15

## 2021-09-02 MED ORDER — DEXAMETHASONE 4 MG PO TABS
20.0000 mg | ORAL_TABLET | Freq: Once | ORAL | Status: DC
  Filled 2021-09-02: qty 5

## 2021-09-02 NOTE — Patient Instructions (Signed)
[facility]  Discharge Instructions: Thank you for choosing [facility] to provide your oncology and hematology care.  If you have a lab appointment with the [facility], please come in thru the Main Entrance and check in at the main information desk.  Wear comfortable clothing and clothing appropriate for easy access to any Portacath or PICC line.   We strive to give you quality time with your provider. You may need to reschedule your appointment if you arrive late (15 or more minutes).  Arriving late affects you and other patients whose appointments are after yours.  Also, if you miss three or more appointments without notifying the office, you may be dismissed from the clinic at the provider's discretion.      For prescription refill requests, have your pharmacy contact our office and allow 72 hours for refills to be completed.    Today you received the following chemotherapy and/or immunotherapy agents Johnny Lucas      To help prevent nausea and vomiting after your treatment, we encourage you to take your nausea medication as directed.  BELOW ARE SYMPTOMS THAT SHOULD BE REPORTED IMMEDIATELY: *FEVER GREATER THAN 100.4 F (38 C) OR HIGHER *CHILLS OR SWEATING *NAUSEA AND VOMITING THAT IS NOT CONTROLLED WITH YOUR NAUSEA MEDICATION *UNUSUAL SHORTNESS OF BREATH *UNUSUAL BRUISING OR BLEEDING *URINARY PROBLEMS (pain or burning when urinating, or frequent urination) *BOWEL PROBLEMS (unusual diarrhea, constipation, pain near the anus) TENDERNESS IN MOUTH AND THROAT WITH OR WITHOUT PRESENCE OF ULCERS (sore throat, sores in mouth, or a toothache) UNUSUAL RASH, SWELLING OR PAIN  UNUSUAL VAGINAL DISCHARGE OR ITCHING   Items with * indicate a potential emergency and should be followed up as soon as possible or go to the Emergency Department if any problems should occur.  Please show the CHEMOTHERAPY ALERT CARD or IMMUNOTHERAPY ALERT CARD at check-in to the Emergency  Department and triage nurse.  Should you have questions after your visit or need to cancel or reschedule your appointment, please contact [facility] [phone]  and follow the prompts.  Office hours are 8:00 a.m. to 4:30 p.m. Monday - Friday. Please note that voicemails left after 4:00 p.m. may not be returned until the following business day.  We are closed weekends and major holidays. You have access to a nurse at all times for urgent questions. Please call the main number to the clinic [phone] and follow the prompts.  For any non-urgent questions, you may also contact your provider using MyChart. We now offer e-Visits for anyone 33 and older to request care online for non-urgent symptoms. For details visit [email or web address].   Also download the MyChart app! Go to the app store, search "MyChart", open the app, select [facility], and log in with your MyChart username and password.  Due to Covid, a mask is required upon entering the hospital/clinic. If you do not have a mask, one will be given to you upon arrival. For doctor visits, patients may have 1 support person aged 20 or older with them. For treatment visits, patients cannot have anyone with them due to current Covid guidelines and our immunocompromised population.   Daratumumab; Hyaluronidase Injection What is this medication? DARATUMUMAB; HYALURONIDASE (dar a toom ue mab / hye al ur ON i dase) is a monoclonal antibody. Hyaluronidase is used to improve the effects of daratumumab. It treats certain types of cancer. Some of the cancers treated are multiple myeloma and light-chain amyloidosis. This medicine may be used for  other purposes; ask your health care provider or pharmacist if you have questions. COMMON BRAND NAME(S): DARZALEX FASPRO What should I tell my care team before I take this medication? They need to know if you have any of these conditions: heart disease infection especially a viral infection such as  chickenpox, cold sores, herpes, or hepatitis B lung or breathing disease an unusual or allergic reaction to daratumumab, hyaluronidase, other medicines, foods, dyes, or preservatives pregnant or trying to get pregnant breast-feeding How should I use this medication? This medicine is for injection under the skin. It is given by a health care professional in a hospital or clinic setting. Talk to your pediatrician regarding the use of this medicine in children. Special care may be needed. Overdosage: If you think you have taken too much of this medicine contact a poison control center or emergency room at once. NOTE: This medicine is only for you. Do not share this medicine with others. What if I miss a dose? Keep appointments for follow-up doses as directed. It is important not to miss your dose. Call your doctor or health care professional if you are unable to keep an appointment. What may interact with this medication? Interactions have not been studied. This list may not describe all possible interactions. Give your health care provider a list of all the medicines, herbs, non-prescription drugs, or dietary supplements you use. Also tell them if you smoke, drink alcohol, or use illegal drugs. Some items may interact with your medicine. What should I watch for while using this medication? Your condition will be monitored carefully while you are receiving this medicine. This medicine can cause serious allergic reactions. To reduce your risk, your health care provider may give you other medicine to take before receiving this one. Be sure to follow the directions from your health care provider. This medicine can affect the results of blood tests to match your blood type. These changes can last for up to 6 months after the final dose. Your healthcare provider will do blood tests to match your blood type before you start treatment. Tell all of your healthcare providers that you are being treated with  this medicine before receiving a blood transfusion. This medicine can affect the results of some tests used to determine treatment response; extra tests may be needed to evaluate response. Do not become pregnant while taking this medicine or for 3 months after stopping it. Women should inform their health care provider if they wish to become pregnant or think they might be pregnant. There is a potential for serious side effects to an unborn child. Talk to your health care provider for more information. Do not breast-feed an infant while taking this medicine. What side effects may I notice from receiving this medication? Side effects that you should report to your doctor or health care professional as soon as possible: allergic reactions like skin rash, itching or hives, swelling of the face, lips, or tongue blurred vision breathing problems chest pain cough dizziness fast heartbeat feeling faint or lightheaded headache low blood counts - this medicine may decrease the number of white blood cells, red blood cells and platelets. You may be at increased risk for infections and bleeding. nausea, vomiting shortness of breath signs of decreased platelets or bleeding - bruising, pinpoint red spots on the skin, black, tarry stools, blood in the urine signs of decreased red blood cells - unusually weak or tired, feeling faint or lightheaded, falls signs of infection - fever or chills, cough,  sore throat, pain or difficulty passing urine Side effects that usually do not require medical attention (report these to your doctor or health care professional if they continue or are bothersome): back pain constipation diarrhea pain, tingling, numbness in the hands or feet muscle cramps swelling of the ankles, feet, hands tiredness trouble sleeping This list may not describe all possible side effects. Call your doctor for medical advice about side effects. You may report side effects to FDA at  1-800-FDA-1088. Where should I keep my medication? This drug is given in a hospital or clinic and will not be stored at home. NOTE: This sheet is a summary. It may not cover all possible information. If you have questions about this medicine, talk to your doctor, pharmacist, or health care provider.  2022 Elsevier/Gold Standard (2021-01-23 12:51:54)

## 2021-09-02 NOTE — Progress Notes (Signed)
Pt here for dara subcutaneous.  Vital signs and labsWNL for treatment today.   Patient is taking pomalyst as prescribed.  They have not missed any doses and report no side effects at this time.    Tolerated treatment today without incidence.  Stable during and after treatment.  AVS reviewed.  Discharged in stable condition ambulatory.

## 2021-09-03 ENCOUNTER — Inpatient Hospital Stay (HOSPITAL_COMMUNITY): Payer: Medicare Other

## 2021-09-03 VITALS — BP 151/77 | HR 61 | Temp 97.8°F | Resp 18

## 2021-09-03 DIAGNOSIS — C9000 Multiple myeloma not having achieved remission: Secondary | ICD-10-CM | POA: Diagnosis not present

## 2021-09-03 DIAGNOSIS — C7951 Secondary malignant neoplasm of bone: Secondary | ICD-10-CM

## 2021-09-03 DIAGNOSIS — Z5112 Encounter for antineoplastic immunotherapy: Secondary | ICD-10-CM | POA: Diagnosis not present

## 2021-09-03 MED ORDER — DENOSUMAB 120 MG/1.7ML ~~LOC~~ SOLN
120.0000 mg | Freq: Once | SUBCUTANEOUS | Status: AC
  Administered 2021-09-03: 120 mg via SUBCUTANEOUS
  Filled 2021-09-03: qty 1.7

## 2021-09-03 NOTE — Patient Instructions (Signed)
[facility]  Discharge Instructions: Thank you for choosing [facility] to provide your oncology and hematology care.  If you have a lab appointment with the Cancer Center, please come in thru the Main Entrance and check in at the main information desk.  Wear comfortable clothing and clothing appropriate for easy access to any Portacath or PICC line.   We strive to give you quality time with your provider. You may need to reschedule your appointment if you arrive late (15 or more minutes).  Arriving late affects you and other patients whose appointments are after yours.  Also, if you miss three or more appointments without notifying the office, you may be dismissed from the clinic at the provider's discretion.      For prescription refill requests, have your pharmacy contact our office and allow 72 hours for refills to be completed.    Today you received the following chemotherapy and/or immunotherapy agents Xgeva      To help prevent nausea and vomiting after your treatment, we encourage you to take your nausea medication as directed.  BELOW ARE SYMPTOMS THAT SHOULD BE REPORTED IMMEDIATELY: *FEVER GREATER THAN 100.4 F (38 C) OR HIGHER *CHILLS OR SWEATING *NAUSEA AND VOMITING THAT IS NOT CONTROLLED WITH YOUR NAUSEA MEDICATION *UNUSUAL SHORTNESS OF BREATH *UNUSUAL BRUISING OR BLEEDING *URINARY PROBLEMS (pain or burning when urinating, or frequent urination) *BOWEL PROBLEMS (unusual diarrhea, constipation, pain near the anus) TENDERNESS IN MOUTH AND THROAT WITH OR WITHOUT PRESENCE OF ULCERS (sore throat, sores in mouth, or a toothache) UNUSUAL RASH, SWELLING OR PAIN  UNUSUAL VAGINAL DISCHARGE OR ITCHING   Items with * indicate a potential emergency and should be followed up as soon as possible or go to the Emergency Department if any problems should occur.  Please show the CHEMOTHERAPY ALERT CARD or IMMUNOTHERAPY ALERT CARD at check-in to the Emergency Department  and triage nurse.  Should you have questions after your visit or need to cancel or reschedule your appointment, please contact  [facility]ANC[facility]ENTER [phone]  and follow the prompts.  Office hours are 8:00 a.m. to 4:30 p.m. Monday - Friday. Please note that voicemails left after 4:00 p.m. may not be returned until the following business day.  We are closed weekends and major holidays. You have access to a nurse at all times for urgent questions. Please call the main number to the clinic [phone] and follow the prompts.  For any non-urgent questions, you may also contact your provider using MyChart. We now offer e-Visits for anyone 18 and older to request care online for non-urgent symptoms. For details visit [email or web address].   Also download the MyChart app! Go to the app store, search "MyChart", open the app, select [facility], and log in with your MyChart username and password.  Due to Covid, a mask is required upon entering the hospital/clinic. If you do not have a mask, one will be given to you upon arrival. For doctor visits, patients may have 1 support person aged 18 or older with them. For treatment visits, patients cannot have anyone with them due to current Covid guidelines and our immunocompromised population.  

## 2021-09-03 NOTE — Progress Notes (Signed)
Patient presents today for Xgeva injection per providers order.  Vital signs WNL.  Labs 09/02/21.  Calcium 8.5.  Patient is not taking calcium and vitamin D supplements per providers instruction, has had no jaw pain or major dental work.   Xgeva administration without incident; injection site WNL; see MAR for injection details.  Patient tolerated procedure well and without incident.  No questions or complaints noted at this time.  Discharge from clinic ambulatory in stable condition.  Alert and oriented X 3.  Follow up with [facility] as scheduled.

## 2021-09-06 ENCOUNTER — Other Ambulatory Visit (HOSPITAL_COMMUNITY): Payer: Self-pay | Admitting: Hematology

## 2021-09-06 DIAGNOSIS — G4700 Insomnia, unspecified: Secondary | ICD-10-CM

## 2021-09-06 DIAGNOSIS — I10 Essential (primary) hypertension: Secondary | ICD-10-CM

## 2021-09-10 ENCOUNTER — Encounter (HOSPITAL_COMMUNITY): Payer: Self-pay | Admitting: Hematology

## 2021-09-10 NOTE — Telephone Encounter (Signed)
Chart reviewed. Pomalyst refilled per last office note with Dr. Katragadda.  

## 2021-09-15 DIAGNOSIS — C9000 Multiple myeloma not having achieved remission: Secondary | ICD-10-CM | POA: Diagnosis not present

## 2021-09-15 DIAGNOSIS — D649 Anemia, unspecified: Secondary | ICD-10-CM | POA: Diagnosis not present

## 2021-09-17 IMAGING — PT NM PET TUM IMG RESTAG (PS) SKULL BASE T - THIGH
3 series · 22 of 25 positions shown · non-contrast
Comparison: PET-CT dated 12/31/2016

CLINICAL DATA: Subsequent treatment strategy for multiple myeloma
(diagnosed 1190) and male breast cancer (diagnosed 6711). Status
post chemotherapy and radiation.

EXAM:
NUCLEAR MEDICINE PET SKULL BASE TO THIGH
TECHNIQUE: 12.5 mCi F-18 FDG was injected intravenously. Full-ring PET imaging
was performed from the skull base to thigh after the radiotracer. CT
data was obtained and used for attenuation correction and anatomic
localization.
Fasting blood glucose: 99 mg/dl

[axial ct wb fusion · 16 of 194 slices shown]
[im 1/194]
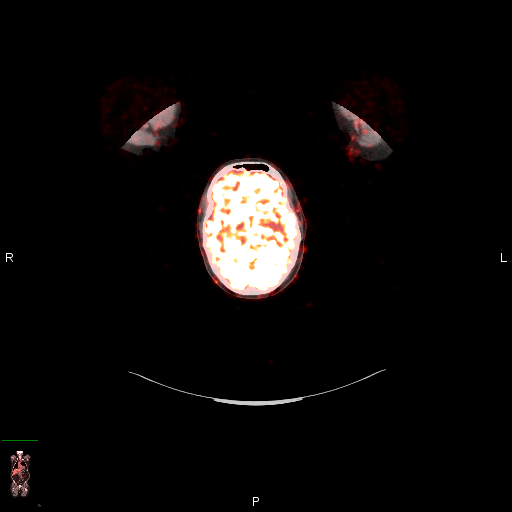
[im 12/194]
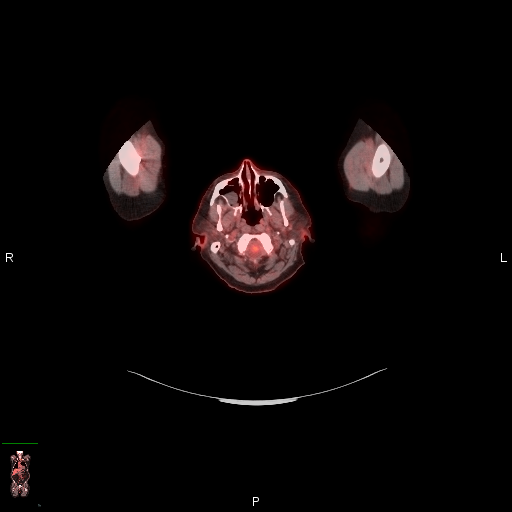
[im 23/194]
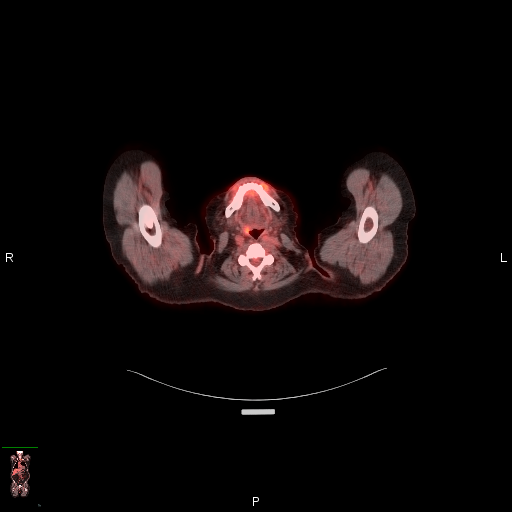
[im 35/194]
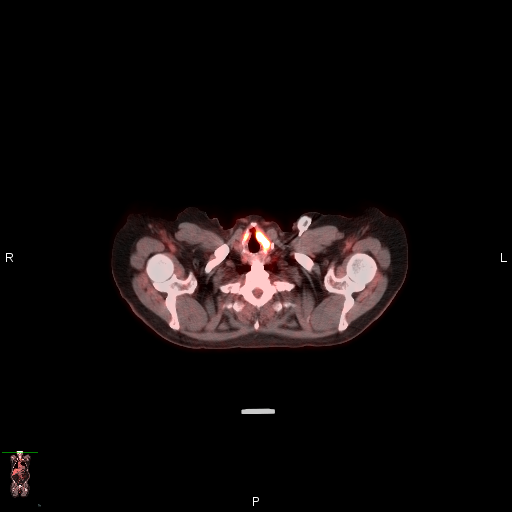
[im 57/194]
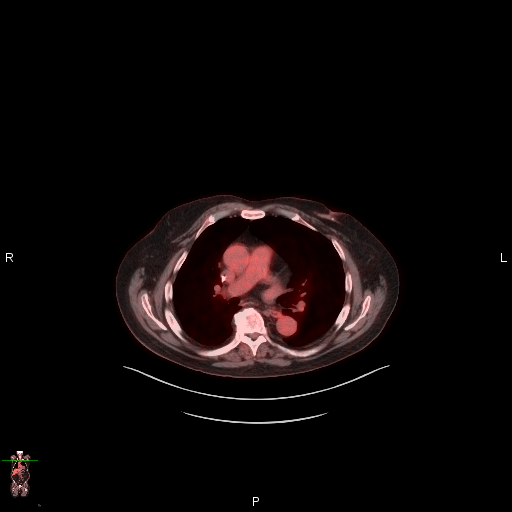
[im 69/194]
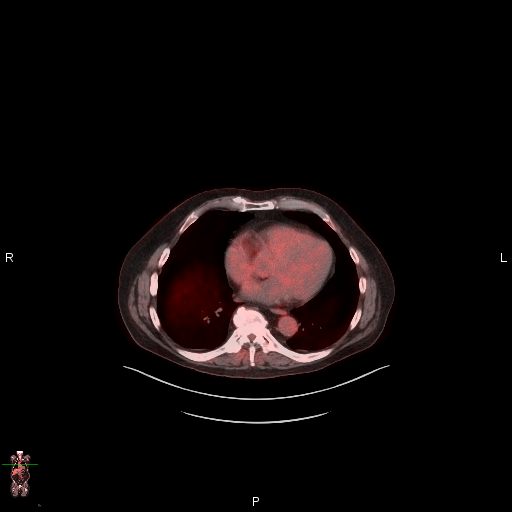
[im 80/194]
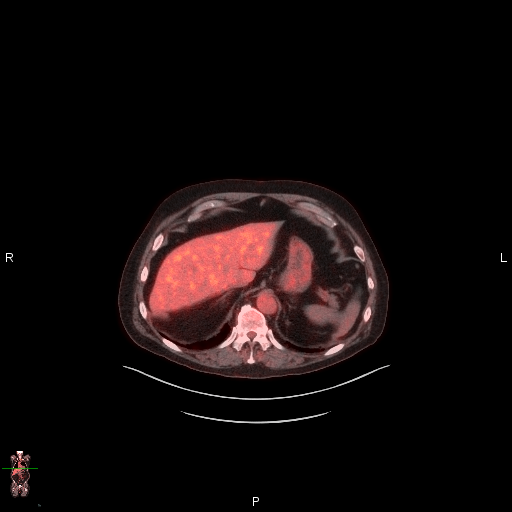
[im 91/194]
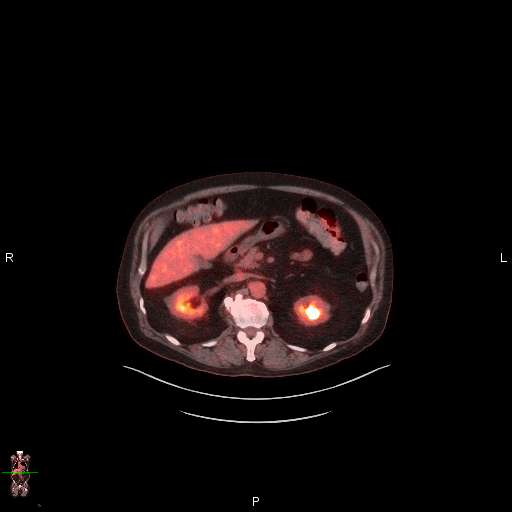
[im 103/194]
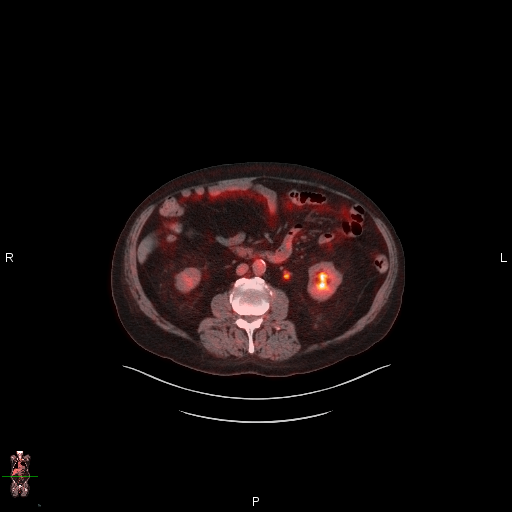
[im 114/194]
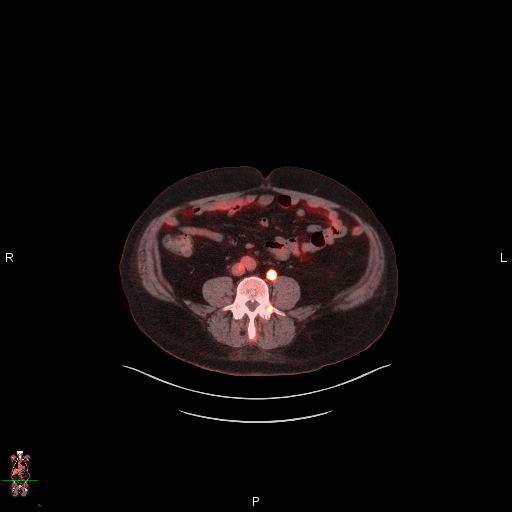
[im 125/194]
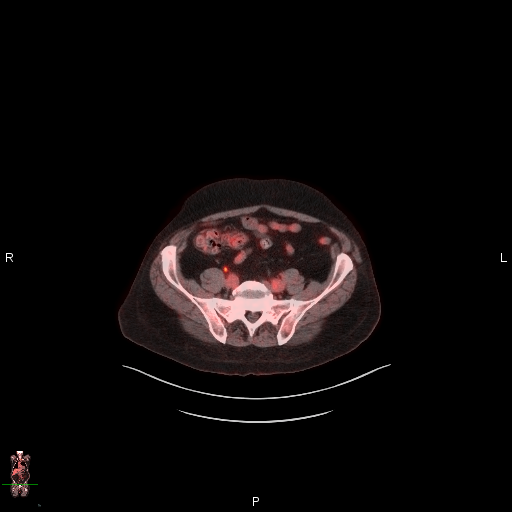
[im 148/194]
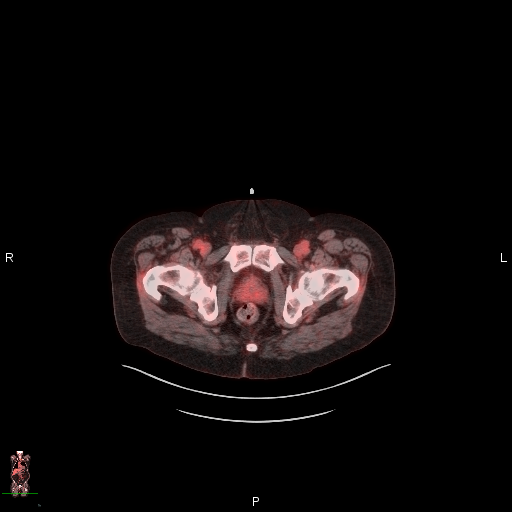
[im 159/194]
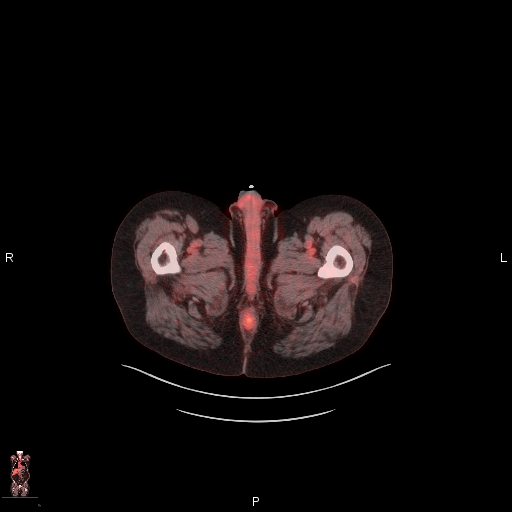
[im 171/194]
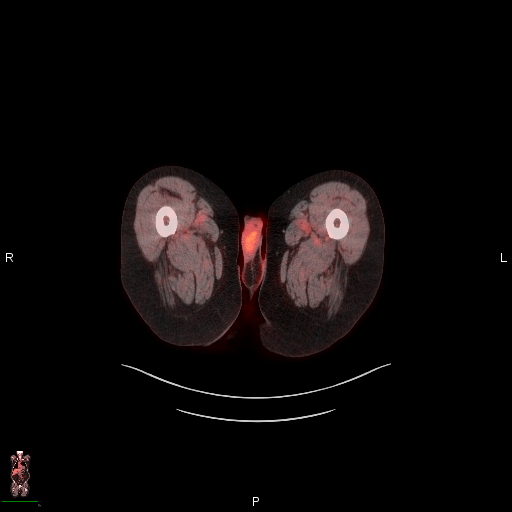
[im 182/194]
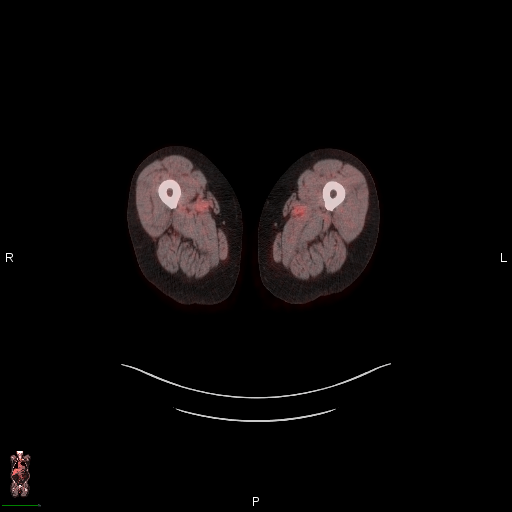
[im 194/194]
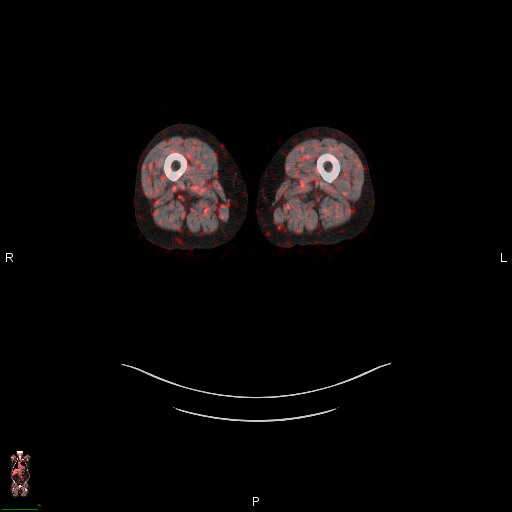

[coronal ct wb fusion · 2 of 21 slices shown]
[im 1/21]
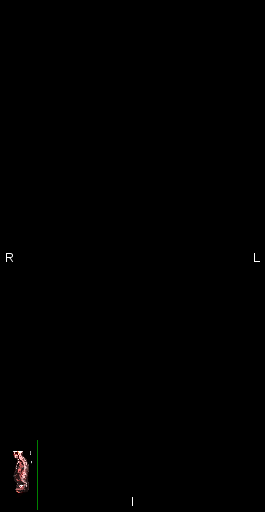
[im 21/21]
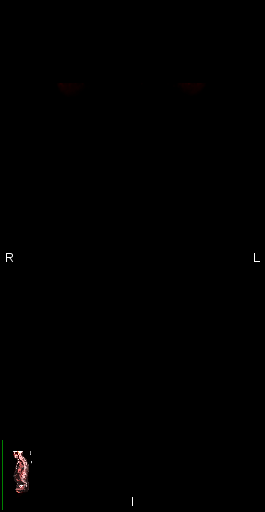

[mip · 4 of 48 slices shown]
[im 12/48]
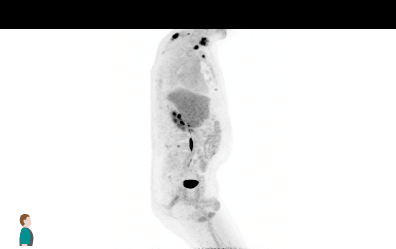
[im 24/48]
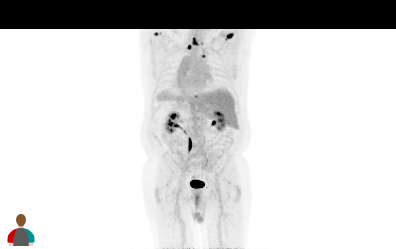
[im 36/48]
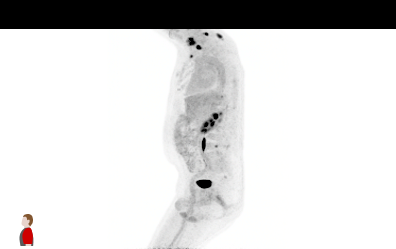
[im 48/48]
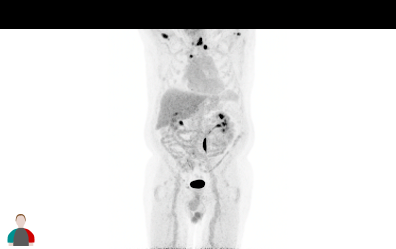

[22 of 25 positions shown; findings below may reference images not displayed]

FINDINGS: Mediastinal blood pool activity: SUV max

Liver activity: SUV max NA

NECK: No hypermetabolic cervical lymphadenopathy.

Incidental CT findings: none

CHEST: 13 mm medial left supraclavicular node at the thoracic inlet
(series 3/image 74), max SUV 14.2, new.

No suspicious pulmonary nodules.

Left chest port terminates the cavoatrial junction.

Incidental CT findings: Mild atherosclerotic calcifications of the
aortic arch. Mild coronary atherosclerosis of the LAD and right
coronary artery.

ABDOMEN/PELVIS: No hypermetabolic abdominopelvic lymphadenopathy.

No abnormal hypermetabolism in the liver, spleen, pancreas, or
adrenal glands.

Incidental CT findings: Scattered hepatic cysts. Atherosclerotic
calcifications the abdominal aorta and branch vessels. Fat within
the left inguinal canal.

SKELETON: Multifocal osseous lesions are largely improved. Residual
lesions include:

--Right acromion, max SUV

--Left acromion, max SUV

--Thyroid cartilage, left greater than right, max SUV

--Right sternum, max SUV

Additional lesions throughout the sternum, bilateral ribs,
thoracolumbar spine, and bilateral pelvis have resolved.

Incidental CT findings: Status post ORIF of the right proximal
humerus. Status post ACDF of the lower cervical spine. Degenerative
changes of the visualized thoracolumbar spine.
IMPRESSION: New medial left supraclavicular node at the thoracic inlet,
suspicious for nodal metastasis.

Marked improvement in osseous metastases, as above.

## 2021-09-21 ENCOUNTER — Other Ambulatory Visit (HOSPITAL_COMMUNITY): Payer: Self-pay | Admitting: Hematology

## 2021-09-21 DIAGNOSIS — C9000 Multiple myeloma not having achieved remission: Secondary | ICD-10-CM

## 2021-09-28 ENCOUNTER — Other Ambulatory Visit (HOSPITAL_COMMUNITY): Payer: Self-pay | Admitting: Hematology

## 2021-09-28 DIAGNOSIS — G4700 Insomnia, unspecified: Secondary | ICD-10-CM

## 2021-09-28 DIAGNOSIS — I10 Essential (primary) hypertension: Secondary | ICD-10-CM

## 2021-09-29 NOTE — Progress Notes (Signed)
[place], [place] [address]   CLINIC:  Medical Oncology/Hematology  PCP:  [name], MD [address] [place] [address] [phone]   REASON FOR VISIT:  Follow-up for multiple myeloma  PRIOR THERAPY:  1. RVD x 6 cycles from 03/01/2017 to 06/29/2017. 2. Stem cell transplant on 09/30/2017. 3. Carfilzomib and cyclophosphamide x 2 cycles from 02/14/2018 to 03/29/2018. 4. Carfilzomib x 22 cycles from 04/20/2018 to 01/23/2020.  NGS Results: not done  CURRENT THERAPY: Darzalex Faspro monthly; Pomalyst 2 mg 3/4 weeks; Xgeva monthly  BRIEF ONCOLOGIC HISTORY:  Oncology History  Breast cancer, male ([place])  12/31/2009 Initial Biopsy   Biopsy of L breast    12/31/2009 Pathology Results   Invasive ductal carcinoma, ER/PR+, HER 2 negative   12/31/2009 Imaging   Ultrasound showing a 2.43 x 1.85 x 3 cm hypoechoic spiculated mass in the 12 o clock L breast retroareolar region   01/01/2010 -  Anti-estrogen oral therapy   Tamoxifen 20 mg daily   01/06/2010 Imaging   Bone scan abnormal uptake in the diaphysis of the R humerus, abnormal in the R third, fifth and sixth ribs, lesion also noted in the sternum.   02/03/2010 Surgery   Rod placement and fixation of R humerus by Dr. [name]   02/05/2010 - 02/18/2010 Radiation Therapy   30Gy in 10 fractions of 3 Gy per fraction to R pathologic fracture   03/11/2010 -  Chemotherapy   Denosumab monthly, now every 3 months. Started at [facility]    06/09/2016 Imaging   Three hypermetabolic osseous lesions in the sternum, left ilium and right ilium, as discussed above, likely represent osseous metastases. At this time, these are not recognizable on the CT images. 2. No extra skeletal metastatic disease identified in the neck, chest, abdomen or pelvis.   10/13/2016 Progression   PET shows various new and enlarging osseous metastatic lesions with no definite extra osseous metastatic disease currently identified.     12/31/2016 Progression   1. Multifocal hypermetabolic osseous metastases throughout the axial and proximal appendicular skeleton, which are increased in size, number and metabolism since 10/13/2016 PET-CT. 2. 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 3. No additional sites of hypermetabolic metastatic disease. 4. Chronic right mastoid sinusitis. 5. Aortic atherosclerosis.  One vessel coronary atherosclerosis.   Multiple myeloma ([place])  02/12/2017 Bone Marrow Biopsy   The marrow was variably cellular with large peritrabecular aggregates of kappa restricted plasma cells (66% by aspirate, 30% by Cd138). Cytogenetics +11.    03/01/2017 - 06/29/2017 Chemotherapy   RVD    05/26/2017 Bone Marrow Biopsy   Performed at [facility]:  Plasma cell myeloma in a 30% cellular marrow with decreased trilineage hematopoiesis and 42% kappa light chain restricted plasma cells on the aspirate smears and large aggregates on the core biopsy.     07/12/2017 - 09/01/2017 Chemotherapy   3 cycles of carfizolmib/cyclophosphamide/dexamethasone     10/08/2017 Bone Marrow Transplant   Autotransplant at [facility]   02/28/2020 -  Chemotherapy    Patient is on Treatment Plan: DARATUMUMAB Q28D       Multiple myeloma not having achieved remission ([place])  06/29/2017 Initial Diagnosis   Multiple myeloma not having achieved remission ([place])   02/28/2020 -  Chemotherapy    Patient is on Treatment Plan: DARATUMUMAB Q28D  CANCER STAGING: Cancer Staging Multiple myeloma ([place]) Staging form: Plasma Cell Myeloma and Plasma Cell Disorders, AJCC 8th Edition - Clinical stage from 05/03/2017: Beta-2-microglobulin (mg/L): 3.5, Albumin (g/dL): 3.4, ISS: Stage II, High-risk cytogenetics: Absent, LDH: Not assessed - Signed by Baird Cancer, PA-C on 05/03/2017   INTERVAL HISTORY:   Mr. BARTLEY VUOLO, a 75 y.o. male, returns for routine follow-up and consideration for next cycle of chemotherapy. Clarance was last seen on 08/05/2021.  Due for cycle #28 of Darzalex faspro today.   Overall, he tells me he has been feeling pretty well. He is taking Pomalyst and tolerating it well. He reports occasional cramping in his hands but he denies any numbness/tingling. He denies leg swellings.   Overall, he feels ready for next cycle of chemo today.   REVIEW OF SYSTEMS:  Review of Systems  Constitutional:  Negative for appetite change (80%) and fatigue (75%).  Cardiovascular:  Negative for leg swelling.  Musculoskeletal:  Positive for myalgias (hands cramping).  Neurological:  Negative for numbness.  All other systems reviewed and are negative.  PAST MEDICAL/SURGICAL HISTORY:  Past Medical History:  Diagnosis Date   Anxiety    Bone metastases ([place]) 09/10/2016   Breast cancer ([place]) 2011   Stage IV breast cancer; radiation and tamoxifen   Breast cancer, male ([place])    Stage IV breast cancer; radiation and tamoxifen  Overview:  Left breast ca with mets bone  Overview:  METS TO BONE   GERD (gastroesophageal reflux disease)    Hypertension    Macular degeneration    Multiple myeloma ([place]) 02/18/2017   Peripheral neuropathy    Past Surgical History:  Procedure Laterality Date   BACK SURGERY     C2-C3 fusion   BREAST BIOPSY Left    cancer   CATARACT EXTRACTION W/PHACO Left 02/03/2019   Procedure: CATARACT EXTRACTION PHACO AND INTRAOCULAR LENS PLACEMENT ([place]);  Surgeon: Baruch Goldmann, MD;  Location: [facility];  Service: Ophthalmology;  Laterality: Left;  CDE: 15.89   CATARACT EXTRACTION W/PHACO Right 07/25/2021   Procedure: CATARACT EXTRACTION PHACO AND INTRAOCULAR LENS PLACEMENT ([facility]);  Surgeon: Baruch Goldmann, MD;  Location: [facility];  Service: Ophthalmology;  Laterality: Right;  CDE   26.71   HERNIA REPAIR     PORTACATH PLACEMENT Left 06/10/2018   Procedure: INSERTION  PORT-A-CATH;  Surgeon: Virl Cagey, MD;  Location: [facility];  Service: General;  Laterality: Left;   right arm surgery     bone cancer and rod placed    SOCIAL HISTORY:  Social History   Socioeconomic History   Marital status: Married    Spouse name: Not on file   Number of children: 3   Years of education: Not on file   Highest education level: Not on file  Occupational History   Not on file  Tobacco Use   Smoking status: Never   Smokeless tobacco: Former  Scientific laboratory technician Use: Never used  Substance and Sexual Activity   Alcohol use: Yes    Alcohol/week: 24.0 standard drinks    Types: 24 Cans of beer per week   Drug use: No   Sexual activity: Yes  Other Topics Concern   Not on file  Social History Narrative   Not on file   Social Determinants of Health   Financial Resource Strain: Low Risk    Difficulty of Paying Living Expenses: Not hard at all  Food Insecurity: No Food Insecurity   Worried About Charity fundraiser  in the Last Year: Never true   [place] in the Last Year: Never true  Transportation Needs: No Transportation Needs   Lack of Transportation (Medical): No   Lack of Transportation (Non-Medical): No  Physical Activity: Inactive   Days of Exercise per Week: 0 days   Minutes of Exercise per Session: 0 min  Stress: Stress Concern Present   Feeling of Stress : To some extent  Social Connections: Moderately Integrated   Frequency of Communication with Friends and Family: More than three times a week   Frequency of Social Gatherings with Friends and Family: Three times a week   Attends Religious Services: 1 to 4 times per year   Active Member of Clubs or Organizations: No   Attends Archivist Meetings: Never   Marital Status: Married  Human resources officer Violence: Not At Risk   Fear of Current or Ex-Partner: No   Emotionally Abused: No   Physically Abused: No   Sexually Abused: No    FAMILY HISTORY:  Family History  Problem  Relation Age of Onset   Stroke Mother    Cancer Maternal Aunt        cancer NOS; died in her 63s   Lung cancer Maternal Uncle        smoker    CURRENT MEDICATIONS:  Current Outpatient Medications  Medication Sig Dispense Refill   acetaminophen (TYLENOL) 500 MG tablet Take 1,000 mg by mouth every 6 (six) hours as needed for moderate pain.     acyclovir (ZOVIRAX) 800 MG tablet TAKE 1 TABLET TWICE DAILY 180 tablet 3   albuterol (VENTOLIN HFA) 108 (90 Base) MCG/ACT inhaler Inhale 2 puffs into the lungs every 6 (six) hours as needed for wheezing or shortness of breath.     ALPRAZolam (XANAX) 0.5 MG tablet TAKE 1 TABLET BY MOUTH AT BEDTIME AS NEEDED FOR anxiety / SLEEP 30 tablet 5   aspirin EC 81 MG tablet Take 81 mg by mouth daily.      denosumab (XGEVA) 120 MG/1.7ML SOLN injection Inject 120 mg into the skin every 30 (thirty) days.      dexamethasone (DECADRON) 4 MG tablet Take 20 mg by mouth every Monday.     diphenhydrAMINE (BENADRYL) 25 MG tablet Take 25 mg by mouth daily as needed for allergies.     folic acid (FOLVITE) 1 MG tablet TAKE 1 TABLET BY MOUTH DAILY 90 tablet 2   gabapentin (NEURONTIN) 300 MG capsule TAKE ONE CAPSULE BY MOUTH THREE TIMES DAILY 180 capsule 2   Loperamide HCl (IMODIUM PO) Take by mouth as needed.     loratadine (CLARITIN) 10 MG tablet Take 1 tablet (10 mg total) by mouth daily. 90 tablet 1   metoprolol succinate (TOPROL-XL) 100 MG 24 hr tablet Take 100 mg by mouth daily.     Multiple Vitamin (MULTIVITAMIN WITH MINERALS) TABS tablet Take 1 tablet by mouth daily.      ondansetron (ZOFRAN) 8 MG tablet TAKE 1 TABLET BY MOUTH EVERY 8 HOURS AS NEEDED FOR NAUSEA AND VOMITING 30 tablet 2   pantoprazole (PROTONIX) 40 MG tablet Take 40 mg by mouth every other day.      polyethylene glycol (MIRALAX / GLYCOLAX) packet Take 17 g by mouth daily as needed for mild constipation. 14 each 0   POMALYST 2 MG capsule TAKE 1 CAPSULE BY MOUTH DAILY FOR 21 DAYS ON FOLLOWED BY 7 DAYS OFF  21 capsule 0   tamoxifen (NOLVADEX) 20 MG tablet TAKE  1 TABLET EVERY DAY 90 tablet 3   No current facility-administered medications for this visit.   Facility-Administered Medications Ordered in Other Visits  Medication Dose Route Frequency Provider Last Rate Last Admin   ondansetron (ZOFRAN) 8 mg in sodium chloride 0.9 % 50 mL IVPB  8 mg Intravenous Once Lockamy, Randi L, NP-C       sodium chloride flush (NS) 0.9 % injection 10 mL  10 mL Intracatheter Once PRN Derek Jack, MD        ALLERGIES:  Allergies  Allergen Reactions   Cefepime     Suspected severe thrombocytopenia is a result of cefepime induced antigen platelet destruction    PHYSICAL EXAM:  Performance status (ECOG): 1 - Symptomatic but completely ambulatory  There were no vitals filed for this visit. Wt Readings from Last 3 Encounters:  09/02/21 217 lb 3.2 oz (98.5 kg)  08/05/21 218 lb 12.8 oz (99.2 kg)  07/22/21 210 lb (95.3 kg)   Physical Exam Vitals reviewed.  Constitutional:      Appearance: Normal appearance.  Cardiovascular:     Rate and Rhythm: Normal rate and regular rhythm.     Pulses: Normal pulses.     Heart sounds: Normal heart sounds.  Pulmonary:     Effort: Pulmonary effort is normal.     Breath sounds: Normal breath sounds.  Musculoskeletal:     Right lower leg: Edema (trace) present.     Left lower leg: Edema (trace) present.  Lymphadenopathy:     Upper Body:     Right upper body: No supraclavicular, axillary or pectoral adenopathy.     Left upper body: No supraclavicular, axillary or pectoral adenopathy.  Neurological:     General: No focal deficit present.     Mental Status: He is alert and oriented to person, place, and time.  Psychiatric:        Mood and Affect: Mood normal.        Behavior: Behavior normal.    LABORATORY DATA:  I have reviewed the labs as listed.  CBC Latest Ref Rng & Units 09/02/2021 08/05/2021 07/08/2021  WBC 4.0 - 10.5 K/uL 4.7 2.8(L) 4.1  Hemoglobin  13.0 - 17.0 g/dL 10.7(L) 10.8(L) 10.4(L)  Hematocrit 39.0 - 52.0 % 32.5(L) 33.3(L) 32.1(L)  Platelets 150 - 400 K/uL 152 144(L) 141(L)   CMP Latest Ref Rng & Units 09/02/2021 08/05/2021 07/08/2021  Glucose 70 - 99 mg/dL 115(H) 110(H) 89  BUN 8 - 23 mg/dL $Remove'15 16 15  'ISLWNIk$ Creatinine 0.61 - 1.24 mg/dL 1.32(H) 1.39(H) 1.57(H)  Sodium 135 - 145 mmol/L 138 138 137  Potassium 3.5 - 5.1 mmol/L 4.4 4.1 3.5  Chloride 98 - 111 mmol/L 107 107 105  CO2 22 - 32 mmol/L $RemoveB'28 26 27  'vbviXflB$ Calcium 8.9 - 10.3 mg/dL 8.5(L) 8.9 8.1(L)  Total Protein 6.5 - 8.1 g/dL 4.8(L) 5.2(L) 5.3(L)  Total Bilirubin 0.3 - 1.2 mg/dL 0.4 0.5 0.6  Alkaline Phos 38 - 126 U/L 26(L) 29(L) 30(L)  AST 15 - 41 U/L 15 13(L) 16  ALT 0 - 44 U/L $Remo'15 15 15    'bEPLl$ DIAGNOSTIC IMAGING:  I have independently reviewed the scans and discussed with the patient. No results found.   ASSESSMENT:  1.  Relapsed multiple myeloma: -Autologous stem cell transplant on 10/08/2017 -Post transplant consolidation with CyCarD for 2 cycles with M spike undetectable. -Maintenance therapy with carfilzomib 70 mg per metered square days 1 and 15 every 28 days from 04/20/2018, dose reduced to 35 mg per  metered square on 01/05/2019, titrated up to 56 mg per metered square on 01/09/2020. 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 -PET scan on 02/18/2020 showed new left supraclavicular lymph node at the thoracic inlet, SUV 14.2.  Multifocal osseous lesions largely improved, though residual lesions noted including right acromion, left acromion, thyroid cartilage, right sternum.  Additional lesions throughout the sternum, bilateral ribs, thoracolumbar spine, bilateral pelvis have resolved. -BMBX on 02/14/2020 shows plasma cell myeloma, 60-70% of cells.  FISH for myeloma was negative.  Cytogenetics was normal. -4 cycles of daratumumab, bortezomib and dexamethasone from 02/28/2020 through 04/29/2020. -Daratumumab, pomalidomide and dexamethasone started  on 05/28/2020.  He is receiving daratumumab every 2 weeks, Pomalyst 3 mg days 1-21, dexamethasone 20 mg weekly. -Pomalidomide dose reduced to 2 mg days 1-21 around the first week of July 2021, due to severe weakness.   PLAN:  1.  Relapsed IgG kappa multiple myeloma: - He was evaluated by [facility] on 09/15/2021. - I have reviewed labs done at that visit.  Free light chain ratio was normal.  Immunofixation was negative.  SPEP shows very small amount of M spike, less than 0.2 g, likely from daratumumab. - We reviewed labs from today which showed white count 3.6 with ANC normal.  Platelet count is mildly low at 120.  Hemoglobin 10.8.  LFTs are normal along with normal LDH. - Continue pomalidomide 2 mg 3 weeks on/1 week off.  He is taking dexamethasone 20 mg weekly. - He may discontinue dexamethasone on the off week of Pomalyst. - Continue monthly daratumumab. - RTC 2 months for follow-up.   2.  Myeloma bone disease: - Continue denosumab monthly.  Denies any side effects.  Continue calcium supplements.   3.  Macrocytic anemia: - Hemoglobin staying stable between 10-11. - No growth factor unless hemoglobin drops below 9.   4.  ID prophylaxis: - Continue acyclovir twice daily.   5.  Peripheral neuropathy: - Continue gabapentin 300 mg 3 times daily.   6.  History of breast cancer: - Continue tamoxifen daily.  No issues reported.  7.  Diarrhea: - On and off diarrhea from Pomalyst is stable. - Continue Imodium as needed.  8.  Hypogammaglobulinemia: - Labs done at [facility] on 09/15/2021 suggested hypogammaglobulinemia. - He does not have any recurrent infections.  He had 2 episodes of "bronchitis", only needed antibiotic the first time.  No other infections reported this year. - If there is recurrent infections, will consider IVIG.   Orders placed this encounter:  No orders of the defined types were placed in this encounter.    Derek Jack, MD [place] [phone]   I, Thana Ates, am acting as a scribe for Dr. Derek Jack.  I, Derek Jack MD, have reviewed the above documentation for accuracy and completeness, and I agree with the above.

## 2021-09-30 ENCOUNTER — Inpatient Hospital Stay (HOSPITAL_COMMUNITY): Payer: Medicare Other

## 2021-09-30 ENCOUNTER — Inpatient Hospital Stay (HOSPITAL_BASED_OUTPATIENT_CLINIC_OR_DEPARTMENT_OTHER): Payer: Medicare Other | Admitting: Hematology

## 2021-09-30 ENCOUNTER — Other Ambulatory Visit: Payer: Self-pay

## 2021-09-30 ENCOUNTER — Inpatient Hospital Stay (HOSPITAL_COMMUNITY): Payer: Medicare Other | Attending: Hematology

## 2021-09-30 VITALS — BP 143/81 | HR 51 | Temp 98.0°F | Resp 20 | Wt 219.6 lb

## 2021-09-30 DIAGNOSIS — Z5112 Encounter for antineoplastic immunotherapy: Secondary | ICD-10-CM | POA: Insufficient documentation

## 2021-09-30 DIAGNOSIS — I10 Essential (primary) hypertension: Secondary | ICD-10-CM | POA: Insufficient documentation

## 2021-09-30 DIAGNOSIS — Z79899 Other long term (current) drug therapy: Secondary | ICD-10-CM | POA: Insufficient documentation

## 2021-09-30 DIAGNOSIS — D539 Nutritional anemia, unspecified: Secondary | ICD-10-CM | POA: Diagnosis not present

## 2021-09-30 DIAGNOSIS — G629 Polyneuropathy, unspecified: Secondary | ICD-10-CM | POA: Insufficient documentation

## 2021-09-30 DIAGNOSIS — Z923 Personal history of irradiation: Secondary | ICD-10-CM | POA: Diagnosis not present

## 2021-09-30 DIAGNOSIS — Z9221 Personal history of antineoplastic chemotherapy: Secondary | ICD-10-CM | POA: Diagnosis not present

## 2021-09-30 DIAGNOSIS — D801 Nonfamilial hypogammaglobulinemia: Secondary | ICD-10-CM | POA: Diagnosis not present

## 2021-09-30 DIAGNOSIS — C9000 Multiple myeloma not having achieved remission: Secondary | ICD-10-CM | POA: Diagnosis not present

## 2021-09-30 DIAGNOSIS — Z853 Personal history of malignant neoplasm of breast: Secondary | ICD-10-CM | POA: Insufficient documentation

## 2021-09-30 DIAGNOSIS — C9002 Multiple myeloma in relapse: Secondary | ICD-10-CM

## 2021-09-30 DIAGNOSIS — Z9484 Stem cells transplant status: Secondary | ICD-10-CM | POA: Insufficient documentation

## 2021-09-30 DIAGNOSIS — K219 Gastro-esophageal reflux disease without esophagitis: Secondary | ICD-10-CM | POA: Diagnosis not present

## 2021-09-30 DIAGNOSIS — R197 Diarrhea, unspecified: Secondary | ICD-10-CM | POA: Insufficient documentation

## 2021-09-30 LAB — COMPREHENSIVE METABOLIC PANEL
ALT: 19 U/L (ref 0–44)
AST: 18 U/L (ref 15–41)
Albumin: 3.4 g/dL — ABNORMAL LOW (ref 3.5–5.0)
Alkaline Phosphatase: 28 U/L — ABNORMAL LOW (ref 38–126)
Anion gap: 5 (ref 5–15)
BUN: 17 mg/dL (ref 8–23)
CO2: 28 mmol/L (ref 22–32)
Calcium: 8.5 mg/dL — ABNORMAL LOW (ref 8.9–10.3)
Chloride: 106 mmol/L (ref 98–111)
Creatinine, Ser: 1.49 mg/dL — ABNORMAL HIGH (ref 0.61–1.24)
GFR, Estimated: 49 mL/min — ABNORMAL LOW (ref >60)
Glucose, Bld: 88 mg/dL (ref 70–99)
Potassium: 4.5 mmol/L (ref 3.5–5.1)
Sodium: 139 mmol/L (ref 135–145)
Total Bilirubin: 0.7 mg/dL (ref 0.3–1.2)
Total Protein: 5.2 g/dL — ABNORMAL LOW (ref 6.5–8.1)

## 2021-09-30 LAB — CBC WITH DIFFERENTIAL/PLATELET
Abs Immature Granulocytes: 0.01 10*3/uL (ref 0.00–0.07)
Basophils Absolute: 0.1 10*3/uL (ref 0.0–0.1)
Basophils Relative: 3 %
Eosinophils Absolute: 0.3 10*3/uL (ref 0.0–0.5)
Eosinophils Relative: 9 %
HCT: 32.8 % — ABNORMAL LOW (ref 39.0–52.0)
Hemoglobin: 10.8 g/dL — ABNORMAL LOW (ref 13.0–17.0)
Immature Granulocytes: 0 %
Lymphocytes Relative: 23 %
Lymphs Abs: 0.8 10*3/uL (ref 0.7–4.0)
MCH: 36.4 pg — ABNORMAL HIGH (ref 26.0–34.0)
MCHC: 32.9 g/dL (ref 30.0–36.0)
MCV: 110.4 fL — ABNORMAL HIGH (ref 80.0–100.0)
Monocytes Absolute: 0.7 10*3/uL (ref 0.1–1.0)
Monocytes Relative: 18 %
Neutro Abs: 1.7 10*3/uL (ref 1.7–7.7)
Neutrophils Relative %: 47 %
Platelets: 120 10*3/uL — ABNORMAL LOW (ref 150–400)
RBC: 2.97 MIL/uL — ABNORMAL LOW (ref 4.22–5.81)
RDW: 15.9 % — ABNORMAL HIGH (ref 11.5–15.5)
WBC: 3.6 10*3/uL — ABNORMAL LOW (ref 4.0–10.5)
nRBC: 0 % (ref 0.0–0.2)

## 2021-09-30 LAB — MAGNESIUM: Magnesium: 1.8 mg/dL (ref 1.7–2.4)

## 2021-09-30 LAB — LACTATE DEHYDROGENASE: LDH: 127 U/L (ref 98–192)

## 2021-09-30 MED ORDER — DARATUMUMAB-HYALURONIDASE-FIHJ 1800-30000 MG-UT/15ML ~~LOC~~ SOLN
1800.0000 mg | Freq: Once | SUBCUTANEOUS | Status: AC
  Administered 2021-09-30: 1800 mg via SUBCUTANEOUS
  Filled 2021-09-30: qty 15

## 2021-09-30 NOTE — Patient Instructions (Signed)
[facility]  Discharge Instructions: Thank you for choosing [facility] to provide your oncology and hematology care.  If you have a lab appointment with the [facility], please come in thru the Main Entrance and check in at the main information desk.  Wear comfortable clothing and clothing appropriate for easy access to any Portacath or PICC line.   We strive to give you quality time with your provider. You may need to reschedule your appointment if you arrive late (15 or more minutes).  Arriving late affects you and other patients whose appointments are after yours.  Also, if you miss three or more appointments without notifying the office, you may be dismissed from the clinic at the provider's discretion.      For prescription refill requests, have your pharmacy contact our office and allow 72 hours for refills to be completed.    Today you received the following chemotherapy and/or immunotherapy agents [place] Dara.   To help prevent nausea and vomiting after your treatment, we encourage you to take your nausea medication as directed.  BELOW ARE SYMPTOMS THAT SHOULD BE REPORTED IMMEDIATELY: *FEVER GREATER THAN 100.4 F (38 C) OR HIGHER *CHILLS OR SWEATING *NAUSEA AND VOMITING THAT IS NOT CONTROLLED WITH YOUR NAUSEA MEDICATION *UNUSUAL SHORTNESS OF BREATH *UNUSUAL BRUISING OR BLEEDING *URINARY PROBLEMS (pain or burning when urinating, or frequent urination) *BOWEL PROBLEMS (unusual diarrhea, constipation, pain near the anus) TENDERNESS IN MOUTH AND THROAT WITH OR WITHOUT PRESENCE OF ULCERS (sore throat, sores in mouth, or a toothache) UNUSUAL RASH, SWELLING OR PAIN  UNUSUAL VAGINAL DISCHARGE OR ITCHING   Items with * indicate a potential emergency and should be followed up as soon as possible or go to the Emergency Department if any problems should occur.  Please show the CHEMOTHERAPY ALERT CARD or IMMUNOTHERAPY ALERT CARD at check-in to the Emergency Department  and triage nurse.  Should you have questions after your visit or need to cancel or reschedule your appointment, please contact [facility] [phone]  and follow the prompts.  Office hours are 8:00 a.m. to 4:30 p.m. Monday - Friday. Please note that voicemails left after 4:00 p.m. may not be returned until the following business day.  We are closed weekends and major holidays. You have access to a nurse at all times for urgent questions. Please call the main number to the clinic [phone] and follow the prompts.  For any non-urgent questions, you may also contact your provider using MyChart. We now offer e-Visits for anyone 52 and older to request care online for non-urgent symptoms. For details visit [email or web address].   Also download the MyChart app! Go to the app store, search "MyChart", open the app, select [facility], and log in with your MyChart username and password.  Due to Covid, a mask is required upon entering the hospital/clinic. If you do not have a mask, one will be given to you upon arrival. For doctor visits, patients may have 1 support person aged 71 or older with them. For treatment visits, patients cannot have anyone with them due to current Covid guidelines and our immunocompromised population.

## 2021-09-30 NOTE — Patient Instructions (Signed)
[place] at [facility] Discharge Instructions  You were seen today by Dr. Delton Coombes. He went over your recent results, and you received your treatment. Dr. Delton Coombes will see you back in 2 months for labs and follow up.   Thank you for choosing [place] at [facility] to provide your oncology and hematology care.  To afford each patient quality time with our provider, please arrive at least 15 minutes before your scheduled appointment time.   If you have a lab appointment with the [facility] please come in thru the Main Entrance and check in at the main information desk  You need to re-schedule your appointment should you arrive 10 or more minutes late.  We strive to give you quality time with our providers, and arriving late affects you and other patients whose appointments are after yours.  Also, if you no show three or more times for appointments you may be dismissed from the clinic at the providers discretion.     Again, thank you for choosing [facility].  Our hope is that these requests will decrease the amount of time that you wait before being seen by our physicians.       _____________________________________________________________  Should you have questions after your visit to [facility], please contact our office at [phone] between the hours of 8:00 a.m. and 4:30 p.m.  Voicemails left after 4:00 p.m. will not be returned until the following business day.  For prescription refill requests, have your pharmacy contact our office and allow 72 hours.    Cancer Center Support Programs:   > Cancer Support Group  2nd Tuesday of the month 1pm-2pm, Journey Room

## 2021-09-30 NOTE — Progress Notes (Signed)
Patient is taking Pomalyst and Tamoxifen as prescribed.  They have not missed any doses and report no side effects at this time.

## 2021-09-30 NOTE — Progress Notes (Signed)
Pt took pre-medications (tylenol 650 mg, benadryl 50 mg, and dexamethasone 20 mg) at home. Okay for treatment today per Dr Raliegh Ip.

## 2021-09-30 NOTE — Progress Notes (Signed)
Patient has been examined, vital signs and labs have been reviewed by Dr. Katragadda. ANC, Creatinine, LFTs, hemoglobin, and platelets are within treatment parameters per Dr. Katragadda. Patient may proceed with treatment per M.D.   

## 2021-09-30 NOTE — Progress Notes (Signed)
Pt presents today for [facility] Dara per provider's order. Vital signs and labs within normal parameters for treatment. Pt voiced no new complaints at this time.   Patient is taking Pomalyst as prescribed.  They have not missed any doses and report no side effects at this time.   [place] Dara given today per MD orders. Tolerated infusion without adverse affects. Vital signs stable. No complaints at this time. Discharged from clinic ambulatory in stable condition. Alert and oriented x 3. F/U with [facility] as scheduled.

## 2021-10-01 ENCOUNTER — Encounter (HOSPITAL_COMMUNITY): Payer: Self-pay

## 2021-10-01 ENCOUNTER — Inpatient Hospital Stay (HOSPITAL_COMMUNITY): Payer: Medicare Other

## 2021-10-01 VITALS — BP 134/61 | HR 55 | Temp 97.1°F | Resp 18

## 2021-10-01 DIAGNOSIS — C7951 Secondary malignant neoplasm of bone: Secondary | ICD-10-CM

## 2021-10-01 DIAGNOSIS — I10 Essential (primary) hypertension: Secondary | ICD-10-CM | POA: Diagnosis not present

## 2021-10-01 DIAGNOSIS — C9000 Multiple myeloma not having achieved remission: Secondary | ICD-10-CM | POA: Diagnosis not present

## 2021-10-01 DIAGNOSIS — G629 Polyneuropathy, unspecified: Secondary | ICD-10-CM | POA: Diagnosis not present

## 2021-10-01 DIAGNOSIS — R197 Diarrhea, unspecified: Secondary | ICD-10-CM | POA: Diagnosis not present

## 2021-10-01 DIAGNOSIS — D539 Nutritional anemia, unspecified: Secondary | ICD-10-CM | POA: Diagnosis not present

## 2021-10-01 DIAGNOSIS — Z5112 Encounter for antineoplastic immunotherapy: Secondary | ICD-10-CM | POA: Diagnosis not present

## 2021-10-01 LAB — KAPPA/LAMBDA LIGHT CHAINS
Kappa free light chain: 2.2 mg/L — ABNORMAL LOW (ref 3.3–19.4)
Kappa, lambda light chain ratio: 1.29 (ref 0.26–1.65)
Lambda free light chains: 1.7 mg/L — ABNORMAL LOW (ref 5.7–26.3)

## 2021-10-01 MED ORDER — DENOSUMAB 120 MG/1.7ML ~~LOC~~ SOLN
120.0000 mg | Freq: Once | SUBCUTANEOUS | Status: AC
  Administered 2021-10-01: 120 mg via SUBCUTANEOUS
  Filled 2021-10-01: qty 1.7

## 2021-10-01 NOTE — Progress Notes (Signed)
Patient taking calcium as directed.  Denied tooth, jaw, and leg pain.  No recent or upcoming dental visits.  Labs reviewed.  Patient tolerated injection with no complaints voiced.  See MAR for details.  Patient stable during and after injection.  Site clean and dry with no bruising or swelling noted.  Band aid applied.  Vss with discharge and left in satisfactory condition with no s/s of distress noted.   

## 2021-10-01 NOTE — Patient Instructions (Signed)
[facility]  Discharge Instructions: Thank you for choosing [facility] to provide your oncology and hematology care.  If you have a lab appointment with the Cancer Center, please come in thru the Main Entrance and check in at the main information desk.  Wear comfortable clothing and clothing appropriate for easy access to any Portacath or PICC line.   We strive to give you quality time with your provider. You may need to reschedule your appointment if you arrive late (15 or more minutes).  Arriving late affects you and other patients whose appointments are after yours.  Also, if you miss three or more appointments without notifying the office, you may be dismissed from the clinic at the provider's discretion.      For prescription refill requests, have your pharmacy contact our office and allow 72 hours for refills to be completed.    Today you received the following chemotherapy and/or immunotherapy agents    To help prevent nausea and vomiting after your treatment, we encourage you to take your nausea medication as directed.  BELOW ARE SYMPTOMS THAT SHOULD BE REPORTED IMMEDIATELY: . *FEVER GREATER THAN 100.4 F (38 C) OR HIGHER . *CHILLS OR SWEATING . *NAUSEA AND VOMITING THAT IS NOT CONTROLLED WITH YOUR NAUSEA MEDICATION . *UNUSUAL SHORTNESS OF BREATH . *UNUSUAL BRUISING OR BLEEDING . *URINARY PROBLEMS (pain or burning when urinating, or frequent urination) . *BOWEL PROBLEMS (unusual diarrhea, constipation, pain near the anus) . TENDERNESS IN MOUTH AND THROAT WITH OR WITHOUT PRESENCE OF ULCERS (sore throat, sores in mouth, or a toothache) . UNUSUAL RASH, SWELLING OR PAIN  . UNUSUAL VAGINAL DISCHARGE OR ITCHING   Items with * indicate a potential emergency and should be followed up as soon as possible or go to the Emergency Department if any problems should occur.  Please show the CHEMOTHERAPY ALERT CARD or IMMUNOTHERAPY ALERT CARD at check-in to the  Emergency Department and triage nurse.  Should you have questions after your visit or need to cancel or reschedule your appointment, please contact [facility] [phone]  and follow the prompts.  Office hours are 8:00 a.m. to 4:30 p.m. Monday - Friday. Please note that voicemails left after 4:00 p.m. may not be returned until the following business day.  We are closed weekends and major holidays. You have access to a nurse at all times for urgent questions. Please call the main number to the clinic [phone] and follow the prompts.  For any non-urgent questions, you may also contact your provider using MyChart. We now offer e-Visits for anyone 18 and older to request care online for non-urgent symptoms. For details visit [email or web address].   Also download the MyChart app! Go to the app store, search "MyChart", open the app, select [facility], and log in with your MyChart username and password.  Due to Covid, a mask is required upon entering the hospital/clinic. If you do not have a mask, one will be given to you upon arrival. For doctor visits, patients may have 1 support person aged 18 or older with them. For treatment visits, patients cannot have anyone with them due to current Covid guidelines and our immunocompromised population.  

## 2021-10-02 ENCOUNTER — Encounter (HOSPITAL_COMMUNITY): Payer: Self-pay | Admitting: Hematology

## 2021-10-02 LAB — PROTEIN ELECTROPHORESIS, SERUM
A/G Ratio: 1.8 — ABNORMAL HIGH (ref 0.7–1.7)
Albumin ELP: 3.1 g/dL (ref 2.9–4.4)
Alpha-1-Globulin: 0.2 g/dL (ref 0.0–0.4)
Alpha-2-Globulin: 0.6 g/dL (ref 0.4–1.0)
Beta Globulin: 0.8 g/dL (ref 0.7–1.3)
Gamma Globulin: 0.1 g/dL — ABNORMAL LOW (ref 0.4–1.8)
Globulin, Total: 1.7 g/dL — ABNORMAL LOW (ref 2.2–3.9)
M-Spike, %: 0.1 g/dL — ABNORMAL HIGH
Total Protein ELP: 4.8 g/dL — ABNORMAL LOW (ref 6.0–8.5)

## 2021-10-02 LAB — IMMUNOFIXATION ELECTROPHORESIS
IgA: <5 mg/dL — ABNORMAL LOW (ref 61–437)
IgG (Immunoglobin G), Serum: 137 mg/dL — ABNORMAL LOW (ref 603–1613)
IgM (Immunoglobulin M), Srm: <5 mg/dL — ABNORMAL LOW (ref 15–143)
Total Protein ELP: 4.9 g/dL — ABNORMAL LOW (ref 6.0–8.5)

## 2021-10-02 NOTE — Telephone Encounter (Signed)
Chart reviewed. Pomalyst refilled per last office note with Dr. Katragadda.  

## 2021-10-03 DIAGNOSIS — Z20822 Contact with and (suspected) exposure to covid-19: Secondary | ICD-10-CM | POA: Diagnosis not present

## 2021-10-08 DIAGNOSIS — Z23 Encounter for immunization: Secondary | ICD-10-CM | POA: Diagnosis not present

## 2021-10-25 ENCOUNTER — Other Ambulatory Visit (HOSPITAL_COMMUNITY): Payer: Self-pay | Admitting: Hematology

## 2021-10-25 DIAGNOSIS — G4700 Insomnia, unspecified: Secondary | ICD-10-CM

## 2021-10-25 DIAGNOSIS — I10 Essential (primary) hypertension: Secondary | ICD-10-CM

## 2021-10-28 ENCOUNTER — Inpatient Hospital Stay (HOSPITAL_COMMUNITY): Payer: Medicare Other

## 2021-10-28 ENCOUNTER — Inpatient Hospital Stay (HOSPITAL_COMMUNITY): Payer: Medicare Other | Attending: Hematology

## 2021-10-28 ENCOUNTER — Other Ambulatory Visit: Payer: Self-pay

## 2021-10-28 VITALS — BP 115/70 | HR 65 | Temp 97.9°F | Resp 18 | Wt 219.4 lb

## 2021-10-28 DIAGNOSIS — Z5111 Encounter for antineoplastic chemotherapy: Secondary | ICD-10-CM | POA: Insufficient documentation

## 2021-10-28 DIAGNOSIS — Z79899 Other long term (current) drug therapy: Secondary | ICD-10-CM | POA: Insufficient documentation

## 2021-10-28 DIAGNOSIS — C9000 Multiple myeloma not having achieved remission: Secondary | ICD-10-CM | POA: Diagnosis not present

## 2021-10-28 LAB — COMPREHENSIVE METABOLIC PANEL
ALT: 15 U/L (ref 0–44)
AST: 15 U/L (ref 15–41)
Albumin: 3.4 g/dL — ABNORMAL LOW (ref 3.5–5.0)
Alkaline Phosphatase: 29 U/L — ABNORMAL LOW (ref 38–126)
Anion gap: 5 (ref 5–15)
BUN: 14 mg/dL (ref 8–23)
CO2: 28 mmol/L (ref 22–32)
Calcium: 8.8 mg/dL — ABNORMAL LOW (ref 8.9–10.3)
Chloride: 104 mmol/L (ref 98–111)
Creatinine, Ser: 1.45 mg/dL — ABNORMAL HIGH (ref 0.61–1.24)
GFR, Estimated: 51 mL/min — ABNORMAL LOW (ref >60)
Glucose, Bld: 128 mg/dL — ABNORMAL HIGH (ref 70–99)
Potassium: 4.7 mmol/L (ref 3.5–5.1)
Sodium: 137 mmol/L (ref 135–145)
Total Bilirubin: 0.7 mg/dL (ref 0.3–1.2)
Total Protein: 5.4 g/dL — ABNORMAL LOW (ref 6.5–8.1)

## 2021-10-28 LAB — CBC WITH DIFFERENTIAL/PLATELET
Abs Immature Granulocytes: 0.02 10*3/uL (ref 0.00–0.07)
Basophils Absolute: 0.1 10*3/uL (ref 0.0–0.1)
Basophils Relative: 3 %
Eosinophils Absolute: 0.5 10*3/uL (ref 0.0–0.5)
Eosinophils Relative: 11 %
HCT: 34.2 % — ABNORMAL LOW (ref 39.0–52.0)
Hemoglobin: 11.6 g/dL — ABNORMAL LOW (ref 13.0–17.0)
Immature Granulocytes: 1 %
Lymphocytes Relative: 23 %
Lymphs Abs: 0.9 10*3/uL (ref 0.7–4.0)
MCH: 36.7 pg — ABNORMAL HIGH (ref 26.0–34.0)
MCHC: 33.9 g/dL (ref 30.0–36.0)
MCV: 108.2 fL — ABNORMAL HIGH (ref 80.0–100.0)
Monocytes Absolute: 0.7 10*3/uL (ref 0.1–1.0)
Monocytes Relative: 16 %
Neutro Abs: 1.9 10*3/uL (ref 1.7–7.7)
Neutrophils Relative %: 46 %
Platelets: 142 10*3/uL — ABNORMAL LOW (ref 150–400)
RBC: 3.16 MIL/uL — ABNORMAL LOW (ref 4.22–5.81)
RDW: 15.8 % — ABNORMAL HIGH (ref 11.5–15.5)
WBC: 4.1 10*3/uL (ref 4.0–10.5)
nRBC: 0 % (ref 0.0–0.2)

## 2021-10-28 LAB — MAGNESIUM: Magnesium: 1.6 mg/dL — ABNORMAL LOW (ref 1.7–2.4)

## 2021-10-28 MED ORDER — DARATUMUMAB-HYALURONIDASE-FIHJ 1800-30000 MG-UT/15ML ~~LOC~~ SOLN
1800.0000 mg | Freq: Once | SUBCUTANEOUS | Status: AC
  Administered 2021-10-28: 1800 mg via SUBCUTANEOUS
  Filled 2021-10-28: qty 15

## 2021-10-28 MED ORDER — MAGNESIUM OXIDE -MG SUPPLEMENT 400 (240 MG) MG PO TABS
400.0000 mg | ORAL_TABLET | Freq: Once | ORAL | Status: AC
  Administered 2021-10-28: 400 mg via ORAL
  Filled 2021-10-28: qty 1

## 2021-10-28 NOTE — Progress Notes (Signed)
Pt presents today for [place] per provider's order. Vital signs stable and pt voiced no new complaints at this time. Pt took pre-meds Tylenol, Benadryl, and Dex at home prior to arrival. Magnesium 1.6 messaged Dr.K about Magnesium. Message received from Dr.K to give 400 magnesium p.o x 1 dose. Okay for treatment per Dr.K  Johnny Lucas and magnesium 400 mg p.o x 1 dose given today per MD orders. Tolerated infusion without adverse affects. Vital signs stable. No complaints at this time. Discharged from clinic ambulatory in stable condition. Alert and oriented x 3. F/U with [facility] as scheduled.

## 2021-10-28 NOTE — Patient Instructions (Signed)
[facility]  Discharge Instructions: Thank you for choosing [facility] to provide your oncology and hematology care.  If you have a lab appointment with the Cancer Center, please come in thru the Main Entrance and check in at the main information desk.  Wear comfortable clothing and clothing appropriate for easy access to any Portacath or PICC line.   We strive to give you quality time with your provider. You may need to reschedule your appointment if you arrive late (15 or more minutes).  Arriving late affects you and other patients whose appointments are after yours.  Also, if you miss three or more appointments without notifying the office, you may be dismissed from the clinic at the provider's discretion.      For prescription refill requests, have your pharmacy contact our office and allow 72 hours for refills to be completed.    Today you received the following chemotherapy and/or immunotherapy agents [place]   To help prevent nausea and vomiting after your treatment, we encourage you to take your nausea medication as directed.  BELOW ARE SYMPTOMS THAT SHOULD BE REPORTED IMMEDIATELY: *FEVER GREATER THAN 100.4 F (38 C) OR HIGHER *CHILLS OR SWEATING *NAUSEA AND VOMITING THAT IS NOT CONTROLLED WITH YOUR NAUSEA MEDICATION *UNUSUAL SHORTNESS OF BREATH *UNUSUAL BRUISING OR BLEEDING *URINARY PROBLEMS (pain or burning when urinating, or frequent urination) *BOWEL PROBLEMS (unusual diarrhea, constipation, pain near the anus) TENDERNESS IN MOUTH AND THROAT WITH OR WITHOUT PRESENCE OF ULCERS (sore throat, sores in mouth, or a toothache) UNUSUAL RASH, SWELLING OR PAIN  UNUSUAL VAGINAL DISCHARGE OR ITCHING   Items with * indicate a potential emergency and should be followed up as soon as possible or go to the Emergency Department if any problems should occur.  Please show the CHEMOTHERAPY ALERT CARD or IMMUNOTHERAPY ALERT CARD at check-in to the Emergency Department  and triage nurse.  Should you have questions after your visit or need to cancel or reschedule your appointment, please contact [facility] [phone]  and follow the prompts.  Office hours are 8:00 a.m. to 4:30 p.m. Monday - Friday. Please note that voicemails left after 4:00 p.m. may not be returned until the following business day.  We are closed weekends and major holidays. You have access to a nurse at all times for urgent questions. Please call the main number to the clinic [phone] and follow the prompts.  For any non-urgent questions, you may also contact your provider using MyChart. We now offer e-Visits for anyone 18 and older to request care online for non-urgent symptoms. For details visit [email or web address].   Also download the MyChart app! Go to the app store, search "MyChart", open the app, select [facility], and log in with your MyChart username and password.  Due to Covid, a mask is required upon entering the hospital/clinic. If you do not have a mask, one will be given to you upon arrival. For doctor visits, patients may have 1 support person aged 18 or older with them. For treatment visits, patients cannot have anyone with them due to current Covid guidelines and our immunocompromised population.  

## 2021-10-29 ENCOUNTER — Encounter (HOSPITAL_COMMUNITY): Payer: Self-pay | Admitting: Hematology

## 2021-10-29 ENCOUNTER — Inpatient Hospital Stay (HOSPITAL_COMMUNITY): Payer: Medicare Other

## 2021-10-29 VITALS — BP 137/73 | HR 74 | Temp 98.5°F | Resp 18

## 2021-10-29 DIAGNOSIS — C9000 Multiple myeloma not having achieved remission: Secondary | ICD-10-CM

## 2021-10-29 DIAGNOSIS — Z5111 Encounter for antineoplastic chemotherapy: Secondary | ICD-10-CM | POA: Diagnosis not present

## 2021-10-29 DIAGNOSIS — C7951 Secondary malignant neoplasm of bone: Secondary | ICD-10-CM

## 2021-10-29 DIAGNOSIS — Z79899 Other long term (current) drug therapy: Secondary | ICD-10-CM | POA: Diagnosis not present

## 2021-10-29 MED ORDER — DENOSUMAB 120 MG/1.7ML ~~LOC~~ SOLN
120.0000 mg | Freq: Once | SUBCUTANEOUS | Status: AC
  Administered 2021-10-29: 120 mg via SUBCUTANEOUS
  Filled 2021-10-29: qty 1.7

## 2021-10-29 NOTE — Progress Notes (Signed)
Patient tolerated Xgeva injection with no complaints voiced.  Site clean and dry with no bruising or swelling noted.  No complaints of pain.  Patient states he is taking calcium and vitamin D supplements daily.  Discharged with vital signs stable and no signs or symptoms of distress noted.

## 2021-10-29 NOTE — Telephone Encounter (Signed)
Chart reviewed. Pomalyst refilled per last office note with Dr. Katragadda.  

## 2021-10-29 NOTE — Patient Instructions (Signed)
[facility]  Discharge Instructions: Thank you for choosing [facility] to provide your oncology and hematology care.  If you have a lab appointment with the Cancer Center, please come in thru the Main Entrance and check in at the main information desk.  Wear comfortable clothing and clothing appropriate for easy access to any Portacath or PICC line.   We strive to give you quality time with your provider. You may need to reschedule your appointment if you arrive late (15 or more minutes).  Arriving late affects you and other patients whose appointments are after yours.  Also, if you miss three or more appointments without notifying the office, you may be dismissed from the clinic at the provider's discretion.      For prescription refill requests, have your pharmacy contact our office and allow 72 hours for refills to be completed.        To help prevent nausea and vomiting after your treatment, we encourage you to take your nausea medication as directed.  BELOW ARE SYMPTOMS THAT SHOULD BE REPORTED IMMEDIATELY: *FEVER GREATER THAN 100.4 F (38 C) OR HIGHER *CHILLS OR SWEATING *NAUSEA AND VOMITING THAT IS NOT CONTROLLED WITH YOUR NAUSEA MEDICATION *UNUSUAL SHORTNESS OF BREATH *UNUSUAL BRUISING OR BLEEDING *URINARY PROBLEMS (pain or burning when urinating, or frequent urination) *BOWEL PROBLEMS (unusual diarrhea, constipation, pain near the anus) TENDERNESS IN MOUTH AND THROAT WITH OR WITHOUT PRESENCE OF ULCERS (sore throat, sores in mouth, or a toothache) UNUSUAL RASH, SWELLING OR PAIN  UNUSUAL VAGINAL DISCHARGE OR ITCHING   Items with * indicate a potential emergency and should be followed up as soon as possible or go to the Emergency Department if any problems should occur.  Please show the CHEMOTHERAPY ALERT CARD or IMMUNOTHERAPY ALERT CARD at check-in to the Emergency Department and triage nurse.  Should you have questions after your visit or need to cancel  or reschedule your appointment, please contact [facility] [phone]  and follow the prompts.  Office hours are 8:00 a.m. to 4:30 p.m. Monday - Friday. Please note that voicemails left after 4:00 p.m. may not be returned until the following business day.  We are closed weekends and major holidays. You have access to a nurse at all times for urgent questions. Please call the main number to the clinic [phone] and follow the prompts.  For any non-urgent questions, you may also contact your provider using MyChart. We now offer e-Visits for anyone 18 and older to request care online for non-urgent symptoms. For details visit [email or web address].   Also download the MyChart app! Go to the app store, search "MyChart", open the app, select [facility], and log in with your MyChart username and password.  Due to Covid, a mask is required upon entering the hospital/clinic. If you do not have a mask, one will be given to you upon arrival. For doctor visits, patients may have 1 support person aged 18 or older with them. For treatment visits, patients cannot have anyone with them due to current Covid guidelines and our immunocompromised population.  

## 2021-11-06 DIAGNOSIS — Z20828 Contact with and (suspected) exposure to other viral communicable diseases: Secondary | ICD-10-CM | POA: Diagnosis not present

## 2021-11-18 ENCOUNTER — Other Ambulatory Visit: Payer: Self-pay

## 2021-11-18 ENCOUNTER — Inpatient Hospital Stay (HOSPITAL_COMMUNITY): Payer: Medicare Other

## 2021-11-18 DIAGNOSIS — Z79899 Other long term (current) drug therapy: Secondary | ICD-10-CM | POA: Diagnosis not present

## 2021-11-18 DIAGNOSIS — Z5111 Encounter for antineoplastic chemotherapy: Secondary | ICD-10-CM | POA: Diagnosis not present

## 2021-11-18 DIAGNOSIS — C9000 Multiple myeloma not having achieved remission: Secondary | ICD-10-CM | POA: Diagnosis not present

## 2021-11-18 LAB — CBC WITH DIFFERENTIAL/PLATELET
Abs Immature Granulocytes: 0.03 10*3/uL (ref 0.00–0.07)
Basophils Absolute: 0 10*3/uL (ref 0.0–0.1)
Basophils Relative: 0 %
Eosinophils Absolute: 0 10*3/uL (ref 0.0–0.5)
Eosinophils Relative: 0 %
HCT: 33.8 % — ABNORMAL LOW (ref 39.0–52.0)
Hemoglobin: 11.8 g/dL — ABNORMAL LOW (ref 13.0–17.0)
Immature Granulocytes: 0 %
Lymphocytes Relative: 9 %
Lymphs Abs: 0.7 10*3/uL (ref 0.7–4.0)
MCH: 37.7 pg — ABNORMAL HIGH (ref 26.0–34.0)
MCHC: 34.9 g/dL (ref 30.0–36.0)
MCV: 108 fL — ABNORMAL HIGH (ref 80.0–100.0)
Monocytes Absolute: 0.4 10*3/uL (ref 0.1–1.0)
Monocytes Relative: 5 %
Neutro Abs: 6.8 10*3/uL (ref 1.7–7.7)
Neutrophils Relative %: 86 %
Platelets: 168 10*3/uL (ref 150–400)
RBC: 3.13 MIL/uL — ABNORMAL LOW (ref 4.22–5.81)
RDW: 15.5 % (ref 11.5–15.5)
WBC: 8 10*3/uL (ref 4.0–10.5)
nRBC: 0 % (ref 0.0–0.2)

## 2021-11-18 LAB — COMPREHENSIVE METABOLIC PANEL
ALT: 16 U/L (ref 0–44)
AST: 16 U/L (ref 15–41)
Albumin: 3.4 g/dL — ABNORMAL LOW (ref 3.5–5.0)
Alkaline Phosphatase: 27 U/L — ABNORMAL LOW (ref 38–126)
Anion gap: 6 (ref 5–15)
BUN: 17 mg/dL (ref 8–23)
CO2: 26 mmol/L (ref 22–32)
Calcium: 9.1 mg/dL (ref 8.9–10.3)
Chloride: 106 mmol/L (ref 98–111)
Creatinine, Ser: 1.56 mg/dL — ABNORMAL HIGH (ref 0.61–1.24)
GFR, Estimated: 47 mL/min — ABNORMAL LOW (ref >60)
Glucose, Bld: 111 mg/dL — ABNORMAL HIGH (ref 70–99)
Potassium: 4.2 mmol/L (ref 3.5–5.1)
Sodium: 138 mmol/L (ref 135–145)
Total Bilirubin: 0.6 mg/dL (ref 0.3–1.2)
Total Protein: 5.3 g/dL — ABNORMAL LOW (ref 6.5–8.1)

## 2021-11-18 LAB — MAGNESIUM: Magnesium: 2.2 mg/dL (ref 1.7–2.4)

## 2021-11-18 LAB — LACTATE DEHYDROGENASE: LDH: 131 U/L (ref 98–192)

## 2021-11-19 LAB — KAPPA/LAMBDA LIGHT CHAINS
Kappa free light chain: 1.4 mg/L — ABNORMAL LOW (ref 3.3–19.4)
Kappa, lambda light chain ratio: 0.78 (ref 0.26–1.65)
Lambda free light chains: 1.8 mg/L — ABNORMAL LOW (ref 5.7–26.3)

## 2021-11-20 ENCOUNTER — Other Ambulatory Visit (HOSPITAL_COMMUNITY): Payer: Self-pay | Admitting: Hematology

## 2021-11-20 DIAGNOSIS — I10 Essential (primary) hypertension: Secondary | ICD-10-CM

## 2021-11-20 DIAGNOSIS — G4700 Insomnia, unspecified: Secondary | ICD-10-CM

## 2021-11-20 LAB — PROTEIN ELECTROPHORESIS, SERUM
A/G Ratio: 2.1 — ABNORMAL HIGH (ref 0.7–1.7)
Albumin ELP: 3.3 g/dL (ref 2.9–4.4)
Alpha-1-Globulin: 0.2 g/dL (ref 0.0–0.4)
Alpha-2-Globulin: 0.5 g/dL (ref 0.4–1.0)
Beta Globulin: 0.7 g/dL (ref 0.7–1.3)
Gamma Globulin: 0.1 g/dL — ABNORMAL LOW (ref 0.4–1.8)
Globulin, Total: 1.6 g/dL — ABNORMAL LOW (ref 2.2–3.9)
M-Spike, %: 0.1 g/dL — ABNORMAL HIGH
Total Protein ELP: 4.9 g/dL — ABNORMAL LOW (ref 6.0–8.5)

## 2021-11-24 ENCOUNTER — Encounter (HOSPITAL_COMMUNITY): Payer: Self-pay | Admitting: Hematology

## 2021-11-24 LAB — IMMUNOFIXATION ELECTROPHORESIS
IgA: <5 mg/dL — ABNORMAL LOW (ref 61–437)
IgG (Immunoglobin G), Serum: 152 mg/dL — ABNORMAL LOW (ref 603–1613)
IgM (Immunoglobulin M), Srm: 7 mg/dL — ABNORMAL LOW (ref 15–143)
Total Protein ELP: 4.7 g/dL — ABNORMAL LOW (ref 6.0–8.5)

## 2021-11-24 NOTE — Progress Notes (Signed)
Johnny Lucas, [place] [address]   CLINIC:  Medical Oncology/Hematology  PCP:  Johnny Percy, MD [address] [place] [address] [phone]   REASON FOR VISIT:  Follow-up for multiple myeloma  PRIOR THERAPY:  1. RVD x 6 cycles from 03/01/2017 to 06/29/2017. 2. Stem cell transplant on 09/30/2017. 3. Carfilzomib and cyclophosphamide x 2 cycles from 02/14/2018 to 03/29/2018. 4. Carfilzomib x 22 cycles from 04/20/2018 to 01/23/2020.  NGS Results: not done  CURRENT THERAPY: Darzalex Faspro monthly; Pomalyst 2 mg 3/4 weeks; Xgeva monthly  BRIEF ONCOLOGIC HISTORY:  Oncology History  Breast cancer, male ([place])  12/31/2009 Initial Biopsy   Biopsy of L breast    12/31/2009 Pathology Results   Invasive ductal carcinoma, ER/PR+, HER 2 negative   12/31/2009 Imaging   Ultrasound showing a 2.43 x 1.85 x 3 cm hypoechoic spiculated mass in the 12 o clock L breast retroareolar region   01/01/2010 -  Anti-estrogen oral therapy   Tamoxifen 20 mg daily   01/06/2010 Imaging   Bone scan abnormal uptake in the diaphysis of the R humerus, abnormal in the R third, fifth and sixth ribs, lesion also noted in the sternum.   02/03/2010 Surgery   Rod placement and fixation of R humerus by Dr. [name]   02/05/2010 - 02/18/2010 Radiation Therapy   30Gy in 10 fractions of 3 Gy per fraction to R pathologic fracture   03/11/2010 -  Chemotherapy   Denosumab monthly, now every 3 months. Started at [facility]    06/09/2016 Imaging   Three hypermetabolic osseous lesions in the sternum, left ilium and right ilium, as discussed above, likely represent osseous metastases. At this time, these are not recognizable on the CT images. 2. No extra skeletal metastatic disease identified in the neck, chest, abdomen or pelvis.   10/13/2016 Progression   PET shows various new and enlarging osseous metastatic lesions with no definite extra osseous metastatic disease currently identified.     12/31/2016 Progression   1. Multifocal hypermetabolic osseous metastases throughout the axial and proximal appendicular skeleton, which are increased in size, number and metabolism since 10/13/2016 PET-CT. 2. 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 3. No additional sites of hypermetabolic metastatic disease. 4. Chronic right mastoid sinusitis. 5. Aortic atherosclerosis.  One vessel coronary atherosclerosis.   Multiple myeloma ([place])  02/12/2017 Bone Marrow Biopsy   The marrow was variably cellular with large peritrabecular aggregates of kappa restricted plasma cells (66% by aspirate, 30% by Cd138). Cytogenetics +11.    03/01/2017 - 06/29/2017 Chemotherapy   RVD    05/26/2017 Bone Marrow Biopsy   Performed at [facility]:  Plasma cell myeloma in a 30% cellular marrow with decreased trilineage hematopoiesis and 42% kappa light chain restricted plasma cells on the aspirate smears and large aggregates on the core biopsy.     07/12/2017 - 09/01/2017 Chemotherapy   3 cycles of carfizolmib/cyclophosphamide/dexamethasone     10/08/2017 Bone Marrow Transplant   Autotransplant at [facility]   02/28/2020 -  Chemotherapy   Patient is on Treatment Plan : Daratumumab q28d     Multiple myeloma not having achieved remission ([employer])  06/29/2017 Initial Diagnosis   Multiple myeloma not having achieved remission ([place])   02/28/2020 -  Chemotherapy   Patient is on Treatment Plan : Daratumumab q28d       CANCER STAGING:  Cancer Staging  Multiple myeloma ([place]) Staging form: Plasma Cell Myeloma and Plasma Cell Disorders, AJCC 8th Edition - Clinical stage from 05/03/2017: Beta-2-microglobulin (mg/L): 3.5, Albumin (g/dL): 3.4, ISS: Stage II, High-risk cytogenetics: Absent, LDH: Not assessed - Signed by Baird Cancer, PA-C on 05/03/2017   INTERVAL HISTORY:  [name], a 75 y.o. male, returns for routine follow-up and consideration for next cycle of chemotherapy. Tyrion was last seen on 09/30/2021.  Due for cycle #30 of Darzalex Faspro today.   Overall, he tells me he has been feeling pretty well. He reports worsening of neuropathy in his feet, and tingling in his hands. He reports weakness in his legs. He reports regular BM, and he denies infections and leg swellings. His appetite is good.   Overall, he feels ready for next cycle of chemo today.   REVIEW OF SYSTEMS:  Review of Systems  Constitutional:  Negative for appetite change and fatigue (75%).  Respiratory:  Positive for shortness of breath.   Cardiovascular:  Negative for leg swelling.  Gastrointestinal:  Negative for constipation and diarrhea.  Neurological:  Positive for extremity weakness (legs) and numbness.  All other systems reviewed and are negative.  PAST MEDICAL/SURGICAL HISTORY:  Past Medical History:  Diagnosis Date   Anxiety    Bone metastases ([place]) 09/10/2016   Breast cancer ([place]) 2011   Stage IV breast cancer; radiation and tamoxifen   Breast cancer, male ([place])    Stage IV breast cancer; radiation and tamoxifen  Overview:  Left breast ca with mets bone  Overview:  METS TO BONE   GERD (gastroesophageal reflux disease)    Hypertension    Macular degeneration    Multiple myeloma ([place]) 02/18/2017   Peripheral neuropathy    Past Surgical History:  Procedure Laterality Date   BACK SURGERY     C2-C3 fusion   BREAST BIOPSY Left    cancer   CATARACT EXTRACTION W/PHACO Left 02/03/2019   Procedure: CATARACT EXTRACTION PHACO AND INTRAOCULAR LENS PLACEMENT ([place]);  Surgeon: Baruch Goldmann, MD;  Location: [facility];  Service: Ophthalmology;  Laterality: Left;  CDE: 15.89   CATARACT EXTRACTION W/PHACO Right 07/25/2021   Procedure: CATARACT EXTRACTION PHACO AND INTRAOCULAR LENS PLACEMENT (I[facility]);  Surgeon: Baruch Goldmann, MD;  Location: [facility];  Service: Ophthalmology;  Laterality:  Right;  CDE   26.71   HERNIA REPAIR     PORTACATH PLACEMENT Left 06/10/2018   Procedure: INSERTION PORT-A-CATH;  Surgeon: Virl Cagey, MD;  Location: [facility];  Service: General;  Laterality: Left;   right arm surgery     bone cancer and rod placed    SOCIAL HISTORY:  Social History   Socioeconomic History   Marital status: Married    Spouse name: Not on file   Number of children: 3   Years of education: Not on file   Highest education level: Not on file  Occupational History   Not on file  Tobacco Use   Smoking status: Never   Smokeless tobacco: Former  Scientific laboratory technician Use: Never used  Substance and Sexual Activity   Alcohol use: Yes    Alcohol/week: 24.0 standard drinks    Types: 24 Cans of beer per week   Drug use: No   Sexual activity: Yes  Other Topics Concern   Not on file  Social History Narrative   Not on file   Social Determinants of Health   Financial Resource Strain: Low Risk    Difficulty of  Paying Living Expenses: Not hard at all  Food Insecurity: No Food Insecurity   Worried About [place] in the Last Year: Never true   Ran Out of Food in the Last Year: Never true  Transportation Needs: No Transportation Needs   Lack of Transportation (Medical): No   Lack of Transportation (Non-Medical): No  Physical Activity: Inactive   Days of Exercise per Week: 0 days   Minutes of Exercise per Session: 0 min  Stress: Stress Concern Present   Feeling of Stress : To some extent  Social Connections: Moderately Integrated   Frequency of Communication with Friends and Family: More than three times a week   Frequency of Social Gatherings with Friends and Family: Three times a week   Attends Religious Services: 1 to 4 times per year   Active Member of Clubs or Organizations: No   Attends Archivist Meetings: Never   Marital Status: Married  Human resources officer Violence: Not At Risk   Fear of Current or Ex-Partner: No   Emotionally Abused:  No   Physically Abused: No   Sexually Abused: No    FAMILY HISTORY:  Family History  Problem Relation Age of Onset   Stroke Mother    Cancer Maternal Aunt        cancer NOS; died in her 53s   Lung cancer Maternal Uncle        smoker    CURRENT MEDICATIONS:  Current Outpatient Medications  Medication Sig Dispense Refill   acetaminophen (TYLENOL) 500 MG tablet Take 1,000 mg by mouth every 6 (six) hours as needed for moderate pain.     acyclovir (ZOVIRAX) 800 MG tablet TAKE 1 TABLET TWICE DAILY 180 tablet 3   albuterol (VENTOLIN HFA) 108 (90 Base) MCG/ACT inhaler Inhale 2 puffs into the lungs every 6 (six) hours as needed for wheezing or shortness of breath.     ALPRAZolam (XANAX) 0.5 MG tablet TAKE 1 TABLET BY MOUTH AT BEDTIME AS NEEDED FOR anxiety / SLEEP 30 tablet 5   aspirin EC 81 MG tablet Take 81 mg by mouth daily.      denosumab (XGEVA) 120 MG/1.7ML SOLN injection Inject 120 mg into the skin every 30 (thirty) days.      dexamethasone (DECADRON) 4 MG tablet Take 20 mg by mouth every Monday.     diphenhydrAMINE (BENADRYL) 25 MG tablet Take 25 mg by mouth daily as needed for allergies.     folic acid (FOLVITE) 1 MG tablet TAKE 1 TABLET BY MOUTH DAILY 90 tablet 2   gabapentin (NEURONTIN) 300 MG capsule TAKE ONE CAPSULE BY MOUTH THREE TIMES DAILY 180 capsule 2   Loperamide HCl (IMODIUM PO) Take by mouth as needed.     loratadine (CLARITIN) 10 MG tablet Take 1 tablet (10 mg total) by mouth daily. 90 tablet 1   metoprolol succinate (TOPROL-XL) 100 MG 24 hr tablet Take 100 mg by mouth daily.     Multiple Vitamin (MULTIVITAMIN WITH MINERALS) TABS tablet Take 1 tablet by mouth daily.      ondansetron (ZOFRAN) 8 MG tablet TAKE 1 TABLET BY MOUTH EVERY 8 HOURS AS NEEDED FOR NAUSEA AND VOMITING 30 tablet 2   pantoprazole (PROTONIX) 40 MG tablet Take 40 mg by mouth every other day.      polyethylene glycol (MIRALAX / GLYCOLAX) packet Take 17 g by mouth daily as needed for mild constipation. 14  each 0   POMALYST 2 MG capsule TAKE 1 CAPSULE BY MOUTH  EVERY DAY FOR 21 DAYS ON FOLLOWED BY 7 DAYS OFF 21 capsule 0   tamoxifen (NOLVADEX) 20 MG tablet TAKE 1 TABLET EVERY DAY 90 tablet 3   No current facility-administered medications for this visit.   Facility-Administered Medications Ordered in Other Visits  Medication Dose Route Frequency Provider Last Rate Last Admin   ondansetron (ZOFRAN) 8 mg in sodium chloride 0.9 % 50 mL IVPB  8 mg Intravenous Once Lockamy, Randi L, NP-C       sodium chloride flush (NS) 0.9 % injection 10 mL  10 mL Intracatheter Once PRN Derek Jack, MD        ALLERGIES:  Allergies  Allergen Reactions   Cefepime     Suspected severe thrombocytopenia is a result of cefepime induced antigen platelet destruction    PHYSICAL EXAM:  Performance status (ECOG): 1 - Symptomatic but completely ambulatory  Vitals:   11/25/21 1058  BP: 128/70  Pulse: 69  Resp: 20  Temp: (!) 97.1 F (36.2 C)  SpO2: 100%   Wt Readings from Last 3 Encounters:  11/25/21 222 lb 12.8 oz (101.1 kg)  10/28/21 219 lb 6.4 oz (99.5 kg)  09/30/21 219 lb 9.3 oz (99.6 kg)   Physical Exam Vitals reviewed.  Constitutional:      Appearance: Normal appearance.  Cardiovascular:     Rate and Rhythm: Normal rate and regular rhythm.     Pulses: Normal pulses.     Heart sounds: Normal heart sounds.  Pulmonary:     Effort: Pulmonary effort is normal.     Breath sounds: Normal breath sounds.  Neurological:     General: No focal deficit present.     Mental Status: He is alert and oriented to person, place, and time.  Psychiatric:        Mood and Affect: Mood normal.        Behavior: Behavior normal.    LABORATORY DATA:  I have reviewed the labs as listed.  CBC Latest Ref Rng & Units 11/18/2021 10/28/2021 09/30/2021  WBC 4.0 - 10.5 K/uL 8.0 4.1 3.6(L)  Hemoglobin 13.0 - 17.0 g/dL 11.8(L) 11.6(L) 10.8(L)  Hematocrit 39.0 - 52.0 % 33.8(L) 34.2(L) 32.8(L)  Platelets 150 - 400 K/uL  168 142(L) 120(L)   CMP Latest Ref Rng & Units 11/18/2021 10/28/2021 09/30/2021  Glucose 70 - 99 mg/dL 111(H) 128(H) 88  BUN 8 - 23 mg/dL $Remove'17 14 17  'JbbwECD$ Creatinine 0.61 - 1.24 mg/dL 1.56(H) 1.45(H) 1.49(H)  Sodium 135 - 145 mmol/L 138 137 139  Potassium 3.5 - 5.1 mmol/L 4.2 4.7 4.5  Chloride 98 - 111 mmol/L 106 104 106  CO2 22 - 32 mmol/L $RemoveB'26 28 28  'rEVJkMHv$ Calcium 8.9 - 10.3 mg/dL 9.1 8.8(L) 8.5(L)  Total Protein 6.5 - 8.1 g/dL 5.3(L) 5.4(L) 5.2(L)  Total Bilirubin 0.3 - 1.2 mg/dL 0.6 0.7 0.7  Alkaline Phos 38 - 126 U/L 27(L) 29(L) 28(L)  AST 15 - 41 U/L $Remo'16 15 18  'GSHDx$ ALT 0 - 44 U/L $Remo'16 15 19    'MjCPo$ DIAGNOSTIC IMAGING:  I have independently reviewed the scans and discussed with the patient. No results found.   ASSESSMENT:  1.  Relapsed multiple myeloma: -Autologous stem cell transplant on 10/08/2017 -Post transplant consolidation with CyCarD for 2 cycles with M spike undetectable. -Maintenance therapy with carfilzomib 70 mg per metered square days 1 and 15 every 28 days from 04/20/2018, dose reduced to 35 mg per metered square on 01/05/2019, titrated up to 56 mg per metered square on 01/09/2020. 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 -PET scan on 02/18/2020 showed new left supraclavicular lymph node at the thoracic inlet, SUV 14.2.  Multifocal osseous lesions largely improved, though residual lesions noted including right acromion, left acromion, thyroid cartilage, right sternum.  Additional lesions throughout the sternum, bilateral ribs, thoracolumbar spine, bilateral pelvis have resolved. -BMBX on 02/14/2020 shows plasma cell myeloma, 60-70% of cells.  FISH for myeloma was negative.  Cytogenetics was normal. -4 cycles of daratumumab, bortezomib and dexamethasone from 02/28/2020 through 04/29/2020. -Daratumumab, pomalidomide and dexamethasone started on 05/28/2020.  He is receiving daratumumab every 2 weeks, Pomalyst 3 mg days 1-21, dexamethasone 20 mg  weekly. -Pomalidomide dose reduced to 2 mg days 1-21 around the first week of July 2021, due to severe weakness.   PLAN:  1.  Relapsed IgG kappa multiple myeloma: - Reviewed myeloma labs from 11/18/2021.  Free light chain ratio is 0.78.  Immunofixation shows IgG kappa.  M spike is 0.1 g and stable.  CBC was grossly normal.  Creatinine is stable around 1.56 with normal calcium. - Continue Pomalyst 2 mg 3 weeks on/1 week off.  Continue dexamethasone 20 mg 3 weeks on/1 week off. - Proceed with daratumumab today and in 4 weeks. - RTC 8 weeks for follow-up with repeat myeloma labs.   2.  Myeloma bone disease: - Continue denosumab monthly.   3.  Macrocytic anemia: - Hemoglobin today is 11.8.  Procrit only if hemoglobin drops below 9.   4.  ID prophylaxis: - Continue acyclovir twice daily.   5.  Peripheral neuropathy: - Continue gabapentin 300 mg 3 times daily.  Neuropathy slightly worse.   6.  History of breast cancer: - Continue tamoxifen daily.  7.  Diarrhea: - Diarrhea from Pomalyst has improved.  Continue Imodium as needed.  8.  Hypogammaglobulinemia: - Labs done at [name] suggested hypogammaglobulinemia. - If recurrent infections, will consider IVIG.   Orders placed this encounter:  Orders Placed This Encounter  Procedures   CBC with Differential   Comprehensive metabolic panel   Magnesium   Lactate dehydrogenase   Kappa/lambda light chains   Immunofixation electrophoresis   Protein electrophoresis, serum   Immunofixation electrophoresis   Protein electrophoresis, serum   Kappa/lambda light chains     Derek Jack, MD [place] [phone]   I, Thana Ates, am acting as a scribe for Dr. Derek Jack.  I, Derek Jack MD, have reviewed the above documentation for accuracy and completeness, and I agree with the above.

## 2021-11-24 NOTE — Telephone Encounter (Signed)
Chart reviewed. Pomalyst refilled per last office note with Dr. Katragadda.  

## 2021-11-25 ENCOUNTER — Inpatient Hospital Stay (HOSPITAL_COMMUNITY): Payer: Medicare Other

## 2021-11-25 ENCOUNTER — Inpatient Hospital Stay (HOSPITAL_BASED_OUTPATIENT_CLINIC_OR_DEPARTMENT_OTHER): Payer: Medicare Other | Admitting: Hematology

## 2021-11-25 ENCOUNTER — Other Ambulatory Visit: Payer: Self-pay

## 2021-11-25 VITALS — BP 128/70 | HR 69 | Temp 97.1°F | Resp 20 | Wt 222.8 lb

## 2021-11-25 DIAGNOSIS — C9000 Multiple myeloma not having achieved remission: Secondary | ICD-10-CM

## 2021-11-25 DIAGNOSIS — Z79899 Other long term (current) drug therapy: Secondary | ICD-10-CM | POA: Diagnosis not present

## 2021-11-25 DIAGNOSIS — Z5111 Encounter for antineoplastic chemotherapy: Secondary | ICD-10-CM | POA: Diagnosis not present

## 2021-11-25 MED ORDER — DARATUMUMAB-HYALURONIDASE-FIHJ 1800-30000 MG-UT/15ML ~~LOC~~ SOLN
1800.0000 mg | Freq: Once | SUBCUTANEOUS | Status: AC
  Administered 2021-11-25: 1800 mg via SUBCUTANEOUS
  Filled 2021-11-25: qty 15

## 2021-11-25 NOTE — Patient Instructions (Signed)
[name]  Discharge Instructions: Thank you for choosing [facility] to provide your oncology and hematology care.  If you have a lab appointment with the [facility], please come in thru the Main Entrance and check in at the main information desk.  Wear comfortable clothing and clothing appropriate for easy access to any Portacath or PICC line.   We strive to give you quality time with your provider. You may need to reschedule your appointment if you arrive late (15 or more minutes).  Arriving late affects you and other patients whose appointments are after yours.  Also, if you miss three or more appointments without notifying the office, you may be dismissed from the clinic at the provider's discretion.      For prescription refill requests, have your pharmacy contact our office and allow 72 hours for refills to be completed.    Today you received the following chemotherapy and/or immunotherapy agents Johnny Lucas Johnny Lucas   To help prevent nausea and vomiting after your treatment, we encourage you to take your nausea medication as directed.  BELOW ARE SYMPTOMS THAT SHOULD BE REPORTED IMMEDIATELY: *FEVER GREATER THAN 100.4 F (38 C) OR HIGHER *CHILLS OR SWEATING *NAUSEA AND VOMITING THAT IS NOT CONTROLLED WITH YOUR NAUSEA MEDICATION *UNUSUAL SHORTNESS OF BREATH *UNUSUAL BRUISING OR BLEEDING *URINARY PROBLEMS (pain or burning when urinating, or frequent urination) *BOWEL PROBLEMS (unusual diarrhea, constipation, pain near the anus) TENDERNESS IN MOUTH AND THROAT WITH OR WITHOUT PRESENCE OF ULCERS (sore throat, sores in mouth, or a toothache) UNUSUAL RASH, SWELLING OR PAIN  UNUSUAL VAGINAL DISCHARGE OR ITCHING   Items with * indicate a potential emergency and should be followed up as soon as possible or go to the Emergency Department if any problems should occur.  Please show the CHEMOTHERAPY ALERT CARD or IMMUNOTHERAPY ALERT CARD at check-in to the Emergency Department  and triage nurse.  Should you have questions after your visit or need to cancel or reschedule your appointment, please contact [facility] [phone]  and follow the prompts.  Office hours are 8:00 a.m. to 4:30 p.m. Monday - Friday. Please note that voicemails left after 4:00 p.m. may not be returned until the following business day.  We are closed weekends and major holidays. You have access to a nurse at all times for urgent questions. Please call the main number to the clinic [phone] and follow the prompts.  For any non-urgent questions, you may also contact your provider using MyChart. We now offer e-Visits for anyone 61 and older to request care online for non-urgent symptoms. For details visit [email or web address].   Also download the MyChart app! Go to the app store, search "MyChart", open the app, select [facility], and log in with your MyChart username and password.  Due to Covid, a mask is required upon entering the hospital/clinic. If you do not have a mask, one will be given to you upon arrival. For doctor visits, patients may have 1 support person aged 57 or older with them. For treatment visits, patients cannot have anyone with them due to current Covid guidelines and our immunocompromised population.

## 2021-11-25 NOTE — Progress Notes (Signed)
Pt presents today for Johnny Lucas per provider's order. Vital signs sand lab WNL for treatment today and pt voiced no new complaints at this time. Pt okay for treatment today per Dr.K. Pt took pre-meds at home prior to arrival.  [facility] given today per MD orders. Tolerated infusion without adverse affects. Vital signs stable. No complaints at this time. Discharged from clinic ambulatory in stable condition. Alert and oriented x 3. F/U with [facility] as scheduled.Pt declined AVS at this time.

## 2021-11-25 NOTE — Progress Notes (Signed)
Patient is taking Pomalyst as prescribed.  He has not missed any doses and reports no side effects at this time.    Patient has been examined, vital signs and labs have been reviewed by Dr. Delton Coombes. ANC, Creatinine, LFTs, hemoglobin, and platelets are within treatment parameters per Dr. Delton Coombes. Patient may proceed with treatment per M.D.

## 2021-11-26 ENCOUNTER — Inpatient Hospital Stay (HOSPITAL_COMMUNITY): Payer: Medicare Other

## 2021-11-26 VITALS — BP 146/74 | HR 63 | Temp 98.8°F | Resp 20

## 2021-11-26 DIAGNOSIS — C9000 Multiple myeloma not having achieved remission: Secondary | ICD-10-CM | POA: Diagnosis not present

## 2021-11-26 DIAGNOSIS — Z79899 Other long term (current) drug therapy: Secondary | ICD-10-CM | POA: Diagnosis not present

## 2021-11-26 DIAGNOSIS — C7951 Secondary malignant neoplasm of bone: Secondary | ICD-10-CM

## 2021-11-26 DIAGNOSIS — Z5111 Encounter for antineoplastic chemotherapy: Secondary | ICD-10-CM | POA: Diagnosis not present

## 2021-11-26 MED ORDER — DENOSUMAB 120 MG/1.7ML ~~LOC~~ SOLN
120.0000 mg | Freq: Once | SUBCUTANEOUS | Status: AC
  Administered 2021-11-26: 120 mg via SUBCUTANEOUS
  Filled 2021-11-26: qty 1.7

## 2021-11-26 NOTE — Progress Notes (Signed)
Johnny Lucas presents today for injection per the provider's orders.  Patient taking Calcium/Vitamin D supplements, no jaw pain or major dental work prior or upcoming.  Xgeva administration without incident; injection site WNL; see MAR for injection details.  Patient tolerated procedure well and without incident.  No questions or complaints noted at this time. Discharge from clinic ambulatory in stable condition.  Alert and oriented X 3.  Follow up with [facility] as scheduled.

## 2021-11-26 NOTE — Patient Instructions (Signed)
[facility]  Discharge Instructions: Thank you for choosing [facility] to provide your oncology and hematology care.  If you have a lab appointment with the Cancer Center, please come in thru the Main Entrance and check in at the main information desk.  Wear comfortable clothing and clothing appropriate for easy access to any Portacath or PICC line.   We strive to give you quality time with your provider. You may need to reschedule your appointment if you arrive late (15 or more minutes).  Arriving late affects you and other patients whose appointments are after yours.  Also, if you miss three or more appointments without notifying the office, you may be dismissed from the clinic at the provider's discretion.      For prescription refill requests, have your pharmacy contact our office and allow 72 hours for refills to be completed.    Today you received the following chemotherapy and/or immunotherapy agents Xgeva      To help prevent nausea and vomiting after your treatment, we encourage you to take your nausea medication as directed.  BELOW ARE SYMPTOMS THAT SHOULD BE REPORTED IMMEDIATELY: *FEVER GREATER THAN 100.4 F (38 C) OR HIGHER *CHILLS OR SWEATING *NAUSEA AND VOMITING THAT IS NOT CONTROLLED WITH YOUR NAUSEA MEDICATION *UNUSUAL SHORTNESS OF BREATH *UNUSUAL BRUISING OR BLEEDING *URINARY PROBLEMS (pain or burning when urinating, or frequent urination) *BOWEL PROBLEMS (unusual diarrhea, constipation, pain near the anus) TENDERNESS IN MOUTH AND THROAT WITH OR WITHOUT PRESENCE OF ULCERS (sore throat, sores in mouth, or a toothache) UNUSUAL RASH, SWELLING OR PAIN  UNUSUAL VAGINAL DISCHARGE OR ITCHING   Items with * indicate a potential emergency and should be followed up as soon as possible or go to the Emergency Department if any problems should occur.  Please show the CHEMOTHERAPY ALERT CARD or IMMUNOTHERAPY ALERT CARD at check-in to the Emergency Department  and triage nurse.  Should you have questions after your visit or need to cancel or reschedule your appointment, please contact [facility] [phone]  and follow the prompts.  Office hours are 8:00 a.m. to 4:30 p.m. Monday - Friday. Please note that voicemails left after 4:00 p.m. may not be returned until the following business day.  We are closed weekends and major holidays. You have access to a nurse at all times for urgent questions. Please call the main number to the clinic [phone] and follow the prompts.  For any non-urgent questions, you may also contact your provider using MyChart. We now offer e-Visits for anyone 18 and older to request care online for non-urgent symptoms. For details visit [email or web address].   Also download the MyChart app! Go to the app store, search "MyChart", open the app, select [facility] [place], and log in with your MyChart username and password.  Due to Covid, a mask is required upon entering the hospital/clinic. If you do not have a mask, one will be given to you upon arrival. For doctor visits, patients may have 1 support person aged 18 or older with them. For treatment visits, patients cannot have anyone with them due to current Covid guidelines and our immunocompromised population.  

## 2021-12-08 DIAGNOSIS — Z20828 Contact with and (suspected) exposure to other viral communicable diseases: Secondary | ICD-10-CM | POA: Diagnosis not present

## 2021-12-15 DIAGNOSIS — Z20828 Contact with and (suspected) exposure to other viral communicable diseases: Secondary | ICD-10-CM | POA: Diagnosis not present

## 2021-12-15 DIAGNOSIS — R062 Wheezing: Secondary | ICD-10-CM | POA: Diagnosis not present

## 2021-12-15 DIAGNOSIS — J45901 Unspecified asthma with (acute) exacerbation: Secondary | ICD-10-CM | POA: Diagnosis not present

## 2021-12-19 ENCOUNTER — Other Ambulatory Visit (HOSPITAL_COMMUNITY): Payer: Self-pay | Admitting: Hematology

## 2021-12-19 DIAGNOSIS — G4700 Insomnia, unspecified: Secondary | ICD-10-CM

## 2021-12-19 DIAGNOSIS — I10 Essential (primary) hypertension: Secondary | ICD-10-CM

## 2021-12-23 ENCOUNTER — Other Ambulatory Visit: Payer: Self-pay

## 2021-12-23 ENCOUNTER — Inpatient Hospital Stay (HOSPITAL_COMMUNITY): Payer: Medicare Other

## 2021-12-23 ENCOUNTER — Inpatient Hospital Stay (HOSPITAL_COMMUNITY): Payer: Medicare Other | Attending: Hematology

## 2021-12-23 ENCOUNTER — Encounter (HOSPITAL_COMMUNITY): Payer: Self-pay | Admitting: Hematology

## 2021-12-23 VITALS — BP 142/68 | HR 62 | Temp 97.0°F | Resp 18 | Ht 69.0 in | Wt 218.4 lb

## 2021-12-23 DIAGNOSIS — Z7982 Long term (current) use of aspirin: Secondary | ICD-10-CM | POA: Diagnosis not present

## 2021-12-23 DIAGNOSIS — C9000 Multiple myeloma not having achieved remission: Secondary | ICD-10-CM | POA: Diagnosis not present

## 2021-12-23 DIAGNOSIS — Z87891 Personal history of nicotine dependence: Secondary | ICD-10-CM | POA: Insufficient documentation

## 2021-12-23 DIAGNOSIS — Z5112 Encounter for antineoplastic immunotherapy: Secondary | ICD-10-CM | POA: Diagnosis not present

## 2021-12-23 DIAGNOSIS — C50922 Malignant neoplasm of unspecified site of left male breast: Secondary | ICD-10-CM | POA: Diagnosis not present

## 2021-12-23 DIAGNOSIS — Z170 Estrogen receptor positive status [ER+]: Secondary | ICD-10-CM | POA: Diagnosis not present

## 2021-12-23 DIAGNOSIS — Z79899 Other long term (current) drug therapy: Secondary | ICD-10-CM | POA: Diagnosis not present

## 2021-12-23 DIAGNOSIS — D649 Anemia, unspecified: Secondary | ICD-10-CM | POA: Insufficient documentation

## 2021-12-23 DIAGNOSIS — C7951 Secondary malignant neoplasm of bone: Secondary | ICD-10-CM | POA: Insufficient documentation

## 2021-12-23 LAB — COMPREHENSIVE METABOLIC PANEL
ALT: 29 U/L (ref 0–44)
AST: 16 U/L (ref 15–41)
Albumin: 3.1 g/dL — ABNORMAL LOW (ref 3.5–5.0)
Alkaline Phosphatase: 26 U/L — ABNORMAL LOW (ref 38–126)
Anion gap: 5 (ref 5–15)
BUN: 14 mg/dL (ref 8–23)
CO2: 26 mmol/L (ref 22–32)
Calcium: 8.2 mg/dL — ABNORMAL LOW (ref 8.9–10.3)
Chloride: 106 mmol/L (ref 98–111)
Creatinine, Ser: 1.35 mg/dL — ABNORMAL HIGH (ref 0.61–1.24)
GFR, Estimated: 55 mL/min — ABNORMAL LOW (ref >60)
Glucose, Bld: 103 mg/dL — ABNORMAL HIGH (ref 70–99)
Potassium: 4.6 mmol/L (ref 3.5–5.1)
Sodium: 137 mmol/L (ref 135–145)
Total Bilirubin: 0.2 mg/dL — ABNORMAL LOW (ref 0.3–1.2)
Total Protein: 4.9 g/dL — ABNORMAL LOW (ref 6.5–8.1)

## 2021-12-23 LAB — CBC WITH DIFFERENTIAL/PLATELET
Abs Immature Granulocytes: 0.02 10*3/uL (ref 0.00–0.07)
Basophils Absolute: 0.1 10*3/uL (ref 0.0–0.1)
Basophils Relative: 2 %
Eosinophils Absolute: 0.7 10*3/uL — ABNORMAL HIGH (ref 0.0–0.5)
Eosinophils Relative: 20 %
HCT: 33.1 % — ABNORMAL LOW (ref 39.0–52.0)
Hemoglobin: 10.9 g/dL — ABNORMAL LOW (ref 13.0–17.0)
Immature Granulocytes: 1 %
Lymphocytes Relative: 21 %
Lymphs Abs: 0.7 10*3/uL (ref 0.7–4.0)
MCH: 36.3 pg — ABNORMAL HIGH (ref 26.0–34.0)
MCHC: 32.9 g/dL (ref 30.0–36.0)
MCV: 110.3 fL — ABNORMAL HIGH (ref 80.0–100.0)
Monocytes Absolute: 0.7 10*3/uL (ref 0.1–1.0)
Monocytes Relative: 19 %
Neutro Abs: 1.3 10*3/uL — ABNORMAL LOW (ref 1.7–7.7)
Neutrophils Relative %: 37 %
Platelets: 133 10*3/uL — ABNORMAL LOW (ref 150–400)
RBC: 3 MIL/uL — ABNORMAL LOW (ref 4.22–5.81)
RDW: 14.9 % (ref 11.5–15.5)
WBC: 3.5 10*3/uL — ABNORMAL LOW (ref 4.0–10.5)
nRBC: 0 % (ref 0.0–0.2)

## 2021-12-23 LAB — MAGNESIUM: Magnesium: 2 mg/dL (ref 1.7–2.4)

## 2021-12-23 MED ORDER — DARATUMUMAB-HYALURONIDASE-FIHJ 1800-30000 MG-UT/15ML ~~LOC~~ SOLN
1800.0000 mg | Freq: Once | SUBCUTANEOUS | Status: AC
  Administered 2021-12-23: 12:00:00 1800 mg via SUBCUTANEOUS
  Filled 2021-12-23: qty 15

## 2021-12-23 NOTE — Progress Notes (Signed)
Ok to treat per MD. Labs reviewed , ANC 1.3  Treatment given per orders. Patient tolerated it well without problems. Vitals stable and discharged home from clinic ambulatory. Follow up as scheduled.

## 2021-12-23 NOTE — Progress Notes (Signed)
Patient takes pre-medications at home prior to arrival.  Henreitta Leber, PharmD

## 2021-12-23 NOTE — Telephone Encounter (Signed)
Chart reviewed. Pomalyst refilled per last office note with Dr. Katragadda.  

## 2021-12-24 ENCOUNTER — Encounter (HOSPITAL_COMMUNITY): Payer: Self-pay | Admitting: Hematology

## 2021-12-24 ENCOUNTER — Encounter (HOSPITAL_COMMUNITY): Payer: Self-pay

## 2021-12-24 ENCOUNTER — Inpatient Hospital Stay (HOSPITAL_COMMUNITY): Payer: Medicare Other

## 2021-12-24 VITALS — BP 136/61 | HR 67 | Temp 97.1°F | Resp 18

## 2021-12-24 DIAGNOSIS — C7951 Secondary malignant neoplasm of bone: Secondary | ICD-10-CM | POA: Diagnosis not present

## 2021-12-24 DIAGNOSIS — C50922 Malignant neoplasm of unspecified site of left male breast: Secondary | ICD-10-CM | POA: Diagnosis not present

## 2021-12-24 DIAGNOSIS — Z170 Estrogen receptor positive status [ER+]: Secondary | ICD-10-CM | POA: Diagnosis not present

## 2021-12-24 DIAGNOSIS — C9000 Multiple myeloma not having achieved remission: Secondary | ICD-10-CM | POA: Diagnosis not present

## 2021-12-24 DIAGNOSIS — Z87891 Personal history of nicotine dependence: Secondary | ICD-10-CM | POA: Diagnosis not present

## 2021-12-24 DIAGNOSIS — Z5112 Encounter for antineoplastic immunotherapy: Secondary | ICD-10-CM | POA: Diagnosis not present

## 2021-12-24 MED ORDER — DENOSUMAB 120 MG/1.7ML ~~LOC~~ SOLN
120.0000 mg | Freq: Once | SUBCUTANEOUS | Status: AC
  Administered 2021-12-24: 11:00:00 120 mg via SUBCUTANEOUS
  Filled 2021-12-24: qty 1.7

## 2021-12-24 NOTE — Progress Notes (Signed)
Patient taking calcium as directed.  Denied tooth, jaw, and leg pain.  No recent or upcoming dental visits.  Labs reviewed.  Patient tolerated injection with no complaints voiced.  See MAR for details.  Patient stable during and after injection.  Site clean and dry with no bruising or swelling noted.  Band aid applied.  Vss with discharge and left in satisfactory condition with no s/s of distress noted.   

## 2021-12-24 NOTE — Patient Instructions (Signed)
[facility]  Discharge Instructions: Thank you for choosing [facility] to provide your oncology and hematology care.  If you have a lab appointment with the Cancer Center, please come in thru the Main Entrance and check in at the main information desk.  Wear comfortable clothing and clothing appropriate for easy access to any Portacath or PICC line.   We strive to give you quality time with your provider. You may need to reschedule your appointment if you arrive late (15 or more minutes).  Arriving late affects you and other patients whose appointments are after yours.  Also, if you miss three or more appointments without notifying the office, you may be dismissed from the clinic at the provider's discretion.      For prescription refill requests, have your pharmacy contact our office and allow 72 hours for refills to be completed.        To help prevent nausea and vomiting after your treatment, we encourage you to take your nausea medication as directed.  BELOW ARE SYMPTOMS THAT SHOULD BE REPORTED IMMEDIATELY: *FEVER GREATER THAN 100.4 F (38 C) OR HIGHER *CHILLS OR SWEATING *NAUSEA AND VOMITING THAT IS NOT CONTROLLED WITH YOUR NAUSEA MEDICATION *UNUSUAL SHORTNESS OF BREATH *UNUSUAL BRUISING OR BLEEDING *URINARY PROBLEMS (pain or burning when urinating, or frequent urination) *BOWEL PROBLEMS (unusual diarrhea, constipation, pain near the anus) TENDERNESS IN MOUTH AND THROAT WITH OR WITHOUT PRESENCE OF ULCERS (sore throat, sores in mouth, or a toothache) UNUSUAL RASH, SWELLING OR PAIN  UNUSUAL VAGINAL DISCHARGE OR ITCHING   Items with * indicate a potential emergency and should be followed up as soon as possible or go to the Emergency Department if any problems should occur.  Please show the CHEMOTHERAPY ALERT CARD or IMMUNOTHERAPY ALERT CARD at check-in to the Emergency Department and triage nurse.  Should you have questions after your visit or need to cancel  or reschedule your appointment, please contact [facility] [phone]  and follow the prompts.  Office hours are 8:00 a.m. to 4:30 p.m. Monday - Friday. Please note that voicemails left after 4:00 p.m. may not be returned until the following business day.  We are closed weekends and major holidays. You have access to a nurse at all times for urgent questions. Please call the main number to the clinic [phone] and follow the prompts.  For any non-urgent questions, you may also contact your provider using MyChart. We now offer e-Visits for anyone 18 and older to request care online for non-urgent symptoms. For details visit [email or web address].   Also download the MyChart app! Go to the app store, search "MyChart", open the app, select [facility], and log in with your MyChart username and password.  Due to Covid, a mask is required upon entering the hospital/clinic. If you do not have a mask, one will be given to you upon arrival. For doctor visits, patients may have 1 support person aged 18 or older with them. For treatment visits, patients cannot have anyone with them due to current Covid guidelines and our immunocompromised population.  

## 2021-12-30 ENCOUNTER — Other Ambulatory Visit (HOSPITAL_COMMUNITY): Payer: Self-pay | Admitting: Hematology

## 2021-12-30 DIAGNOSIS — C9000 Multiple myeloma not having achieved remission: Secondary | ICD-10-CM

## 2022-01-06 DIAGNOSIS — Z20828 Contact with and (suspected) exposure to other viral communicable diseases: Secondary | ICD-10-CM | POA: Diagnosis not present

## 2022-01-13 ENCOUNTER — Other Ambulatory Visit: Payer: Self-pay

## 2022-01-13 ENCOUNTER — Inpatient Hospital Stay (HOSPITAL_COMMUNITY): Payer: Medicare Other | Attending: Hematology

## 2022-01-13 DIAGNOSIS — C9000 Multiple myeloma not having achieved remission: Secondary | ICD-10-CM | POA: Insufficient documentation

## 2022-01-13 DIAGNOSIS — G629 Polyneuropathy, unspecified: Secondary | ICD-10-CM | POA: Diagnosis not present

## 2022-01-13 DIAGNOSIS — I10 Essential (primary) hypertension: Secondary | ICD-10-CM | POA: Insufficient documentation

## 2022-01-13 DIAGNOSIS — Z79899 Other long term (current) drug therapy: Secondary | ICD-10-CM | POA: Diagnosis not present

## 2022-01-13 DIAGNOSIS — Z853 Personal history of malignant neoplasm of breast: Secondary | ICD-10-CM | POA: Insufficient documentation

## 2022-01-13 DIAGNOSIS — Z5111 Encounter for antineoplastic chemotherapy: Secondary | ICD-10-CM | POA: Insufficient documentation

## 2022-01-13 DIAGNOSIS — Z79810 Long term (current) use of selective estrogen receptor modulators (SERMs): Secondary | ICD-10-CM | POA: Diagnosis not present

## 2022-01-13 DIAGNOSIS — K219 Gastro-esophageal reflux disease without esophagitis: Secondary | ICD-10-CM | POA: Diagnosis not present

## 2022-01-13 DIAGNOSIS — Z9484 Stem cells transplant status: Secondary | ICD-10-CM | POA: Insufficient documentation

## 2022-01-13 DIAGNOSIS — R197 Diarrhea, unspecified: Secondary | ICD-10-CM | POA: Insufficient documentation

## 2022-01-13 DIAGNOSIS — D801 Nonfamilial hypogammaglobulinemia: Secondary | ICD-10-CM | POA: Diagnosis not present

## 2022-01-13 DIAGNOSIS — D649 Anemia, unspecified: Secondary | ICD-10-CM | POA: Diagnosis not present

## 2022-01-13 LAB — COMPREHENSIVE METABOLIC PANEL
ALT: 16 U/L (ref 0–44)
AST: 14 U/L — ABNORMAL LOW (ref 15–41)
Albumin: 3.3 g/dL — ABNORMAL LOW (ref 3.5–5.0)
Alkaline Phosphatase: 28 U/L — ABNORMAL LOW (ref 38–126)
Anion gap: 7 (ref 5–15)
BUN: 10 mg/dL (ref 8–23)
CO2: 23 mmol/L (ref 22–32)
Calcium: 8.1 mg/dL — ABNORMAL LOW (ref 8.9–10.3)
Chloride: 109 mmol/L (ref 98–111)
Creatinine, Ser: 1.35 mg/dL — ABNORMAL HIGH (ref 0.61–1.24)
GFR, Estimated: 55 mL/min — ABNORMAL LOW (ref >60)
Glucose, Bld: 98 mg/dL (ref 70–99)
Potassium: 4.4 mmol/L (ref 3.5–5.1)
Sodium: 139 mmol/L (ref 135–145)
Total Bilirubin: 0.7 mg/dL (ref 0.3–1.2)
Total Protein: 5.2 g/dL — ABNORMAL LOW (ref 6.5–8.1)

## 2022-01-13 LAB — CBC WITH DIFFERENTIAL/PLATELET
Abs Immature Granulocytes: 0.01 10*3/uL (ref 0.00–0.07)
Basophils Absolute: 0.1 10*3/uL (ref 0.0–0.1)
Basophils Relative: 2 %
Eosinophils Absolute: 0.3 10*3/uL (ref 0.0–0.5)
Eosinophils Relative: 7 %
HCT: 36.3 % — ABNORMAL LOW (ref 39.0–52.0)
Hemoglobin: 12 g/dL — ABNORMAL LOW (ref 13.0–17.0)
Immature Granulocytes: 0 %
Lymphocytes Relative: 19 %
Lymphs Abs: 1 10*3/uL (ref 0.7–4.0)
MCH: 36.5 pg — ABNORMAL HIGH (ref 26.0–34.0)
MCHC: 33.1 g/dL (ref 30.0–36.0)
MCV: 110.3 fL — ABNORMAL HIGH (ref 80.0–100.0)
Monocytes Absolute: 0.4 10*3/uL (ref 0.1–1.0)
Monocytes Relative: 8 %
Neutro Abs: 3.3 10*3/uL (ref 1.7–7.7)
Neutrophils Relative %: 64 %
Platelets: 150 10*3/uL (ref 150–400)
RBC: 3.29 MIL/uL — ABNORMAL LOW (ref 4.22–5.81)
RDW: 16 % — ABNORMAL HIGH (ref 11.5–15.5)
WBC: 5.1 10*3/uL (ref 4.0–10.5)
nRBC: 0 % (ref 0.0–0.2)

## 2022-01-13 LAB — MAGNESIUM: Magnesium: 1.9 mg/dL (ref 1.7–2.4)

## 2022-01-14 LAB — KAPPA/LAMBDA LIGHT CHAINS
Kappa free light chain: 1.5 mg/L — ABNORMAL LOW (ref 3.3–19.4)
Kappa, lambda light chain ratio: >1 (ref 0.26–1.65)
Lambda free light chains: <1.5 mg/L — ABNORMAL LOW (ref 5.7–26.3)

## 2022-01-14 LAB — PROTEIN ELECTROPHORESIS, SERUM
A/G Ratio: 1.6 (ref 0.7–1.7)
Albumin ELP: 2.9 g/dL (ref 2.9–4.4)
Alpha-1-Globulin: 0.3 g/dL (ref 0.0–0.4)
Alpha-2-Globulin: 0.6 g/dL (ref 0.4–1.0)
Beta Globulin: 0.8 g/dL (ref 0.7–1.3)
Gamma Globulin: 0.2 g/dL — ABNORMAL LOW (ref 0.4–1.8)
Globulin, Total: 1.8 g/dL — ABNORMAL LOW (ref 2.2–3.9)
M-Spike, %: 0.1 g/dL — ABNORMAL HIGH
Total Protein ELP: 4.7 g/dL — ABNORMAL LOW (ref 6.0–8.5)

## 2022-01-17 ENCOUNTER — Other Ambulatory Visit (HOSPITAL_COMMUNITY): Payer: Self-pay | Admitting: Hematology

## 2022-01-17 DIAGNOSIS — G4700 Insomnia, unspecified: Secondary | ICD-10-CM

## 2022-01-17 DIAGNOSIS — I10 Essential (primary) hypertension: Secondary | ICD-10-CM

## 2022-01-19 LAB — IMMUNOFIXATION ELECTROPHORESIS
IgA: <5 mg/dL — ABNORMAL LOW (ref 61–437)
IgG (Immunoglobin G), Serum: 169 mg/dL — ABNORMAL LOW (ref 603–1613)
IgM (Immunoglobulin M), Srm: 9 mg/dL — ABNORMAL LOW (ref 15–143)
Total Protein ELP: 4.8 g/dL — ABNORMAL LOW (ref 6.0–8.5)

## 2022-01-20 ENCOUNTER — Inpatient Hospital Stay (HOSPITAL_BASED_OUTPATIENT_CLINIC_OR_DEPARTMENT_OTHER): Payer: Medicare Other | Admitting: Hematology

## 2022-01-20 ENCOUNTER — Inpatient Hospital Stay (HOSPITAL_COMMUNITY): Payer: Medicare Other

## 2022-01-20 ENCOUNTER — Other Ambulatory Visit: Payer: Self-pay

## 2022-01-20 ENCOUNTER — Other Ambulatory Visit (HOSPITAL_COMMUNITY): Payer: Medicare Other

## 2022-01-20 VITALS — BP 141/78 | HR 62 | Temp 98.4°F | Resp 18 | Ht 69.0 in | Wt 219.8 lb

## 2022-01-20 DIAGNOSIS — C9000 Multiple myeloma not having achieved remission: Secondary | ICD-10-CM

## 2022-01-20 DIAGNOSIS — D801 Nonfamilial hypogammaglobulinemia: Secondary | ICD-10-CM | POA: Diagnosis not present

## 2022-01-20 DIAGNOSIS — Z5111 Encounter for antineoplastic chemotherapy: Secondary | ICD-10-CM | POA: Diagnosis not present

## 2022-01-20 DIAGNOSIS — Z79810 Long term (current) use of selective estrogen receptor modulators (SERMs): Secondary | ICD-10-CM | POA: Diagnosis not present

## 2022-01-20 DIAGNOSIS — R197 Diarrhea, unspecified: Secondary | ICD-10-CM | POA: Diagnosis not present

## 2022-01-20 DIAGNOSIS — D649 Anemia, unspecified: Secondary | ICD-10-CM | POA: Diagnosis not present

## 2022-01-20 MED ORDER — LORATADINE 10 MG PO TABS: 10.0000 mg | ORAL_TABLET | Freq: Every day | ORAL | 6 refills | Status: DC

## 2022-01-20 MED ORDER — DARATUMUMAB-HYALURONIDASE-FIHJ 1800-30000 MG-UT/15ML ~~LOC~~ SOLN
1800.0000 mg | Freq: Once | SUBCUTANEOUS | Status: AC
  Administered 2022-01-20: 12:00:00 1800 mg via SUBCUTANEOUS
  Filled 2022-01-20: qty 15

## 2022-01-20 MED ORDER — DEXAMETHASONE 4 MG PO TABS: 20.0000 mg | ORAL_TABLET | Freq: Once | ORAL | Status: DC

## 2022-01-20 MED ORDER — DIPHENHYDRAMINE HCL 25 MG PO CAPS: 50.0000 mg | ORAL_CAPSULE | Freq: Once | ORAL | Status: DC

## 2022-01-20 MED ORDER — ACETAMINOPHEN 325 MG PO TABS: 650.0000 mg | ORAL_TABLET | Freq: Once | ORAL | Status: DC

## 2022-01-20 NOTE — Progress Notes (Signed)
Patient presents today for Daratumumab injection pre providers order.  Vital signs within parameters for treatment.  Patient too premedications at home prior to appointment.  Message received from Anastasio Champion RN/Dr. Delton Coombes patient okay for treatment.  Daratumumab administration without incident; injection site WNL; see MAR for injection details.  Patient tolerated procedure well and without incident.  No questions or complaints noted at this time. Discharge from clinic ambulatory in stable condition.  Alert and oriented X 3.  Follow up with [facility] as scheduled.

## 2022-01-20 NOTE — Progress Notes (Signed)
Johnny Lucas, [place] [address]   CLINIC:  Medical Oncology/Hematology  PCP:  Johnny Percy, MD [address] [place] [address] [phone]   REASON FOR VISIT:  Follow-up for multiple myeloma  PRIOR THERAPY:  1. RVD x 6 cycles from 03/01/2017 to 06/29/2017. 2. Stem cell transplant on 09/30/2017. 3. Carfilzomib and cyclophosphamide x 2 cycles from 02/14/2018 to 03/29/2018. 4. Carfilzomib x 22 cycles from 04/20/2018 to 01/23/2020.  NGS Results: not done  CURRENT THERAPY: Darzalex Faspro monthly; Pomalyst 2 mg 3/4 weeks; Xgeva monthly  BRIEF ONCOLOGIC HISTORY:  Oncology History  Breast Lucas, male ([place])  12/31/2009 Initial Biopsy   Biopsy of L breast    12/31/2009 Pathology Results   Invasive ductal carcinoma, ER/PR+, HER 2 negative   12/31/2009 Imaging   Ultrasound showing a 2.43 x 1.85 x 3 cm hypoechoic spiculated mass in the 12 o clock L breast retroareolar region   01/01/2010 -  Anti-estrogen oral therapy   Tamoxifen 20 mg daily   01/06/2010 Imaging   Bone scan abnormal uptake in the diaphysis of the R humerus, abnormal in the R third, fifth and sixth ribs, lesion also noted in the sternum.   02/03/2010 Surgery   Rod placement and fixation of R humerus by Dr. Amedeo Lucas   02/05/2010 - 02/18/2010 Radiation Therapy   30Gy in 10 fractions of 3 Gy per fraction to R pathologic fracture   03/11/2010 -  Chemotherapy   Denosumab monthly, now every 3 months. Started at [facility]    06/09/2016 Imaging   Three hypermetabolic osseous lesions in the sternum, left ilium and right ilium, as discussed above, likely represent osseous metastases. At this time, these are not recognizable on the CT images. 2. No extra skeletal metastatic disease identified in the neck, chest, abdomen or pelvis.   10/13/2016 Progression   PET shows various new and enlarging osseous metastatic lesions with no definite extra osseous metastatic disease currently identified.     12/31/2016 Progression   1. Multifocal hypermetabolic osseous metastases throughout the axial and proximal appendicular skeleton, which are increased in size, number and metabolism since 10/13/2016 PET-CT. 2. 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 3. No additional sites of hypermetabolic metastatic disease. 4. Chronic right mastoid sinusitis. 5. Aortic atherosclerosis.  One vessel coronary atherosclerosis.   Multiple myeloma ([place])  02/12/2017 Bone Marrow Biopsy   The marrow was variably cellular with large peritrabecular aggregates of kappa restricted plasma cells (66% by aspirate, 30% by Cd138). Cytogenetics +11.    03/01/2017 - 06/29/2017 Chemotherapy   RVD    05/26/2017 Bone Marrow Biopsy   Performed at [facility]:  Plasma cell myeloma in a 30% cellular marrow with decreased trilineage hematopoiesis and 42% kappa light chain restricted plasma cells on the aspirate smears and large aggregates on the core biopsy.     07/12/2017 - 09/01/2017 Chemotherapy   3 cycles of carfizolmib/cyclophosphamide/dexamethasone     10/08/2017 Bone Marrow Transplant   Autotransplant at [facility]   02/28/2020 -  Chemotherapy   Patient is on Treatment Plan : Daratumumab q28d     Multiple myeloma not having achieved remission ([place])  06/29/2017 Initial Diagnosis   Multiple myeloma not having achieved remission ([place])   02/28/2020 -  Chemotherapy   Patient is on Treatment Plan : Daratumumab q28d       Lucas STAGING:  Lucas Staging  Multiple myeloma ([place]) Staging form: Plasma Cell Myeloma and Plasma Cell Disorders, AJCC 8th Edition - Clinical stage from 05/03/2017: Beta-2-microglobulin (mg/L): 3.5, Albumin (g/dL): 3.4, ISS: Stage II, High-risk cytogenetics: Absent, LDH: Not assessed - Signed by Johnny Cancer, PA-C on 05/03/2017   INTERVAL HISTORY:  Johnny Lucas, a 76 y.o. male, returns for routine follow-up and consideration for next cycle of chemotherapy. Johnny Lucas was last seen on 11/25/2021.  Due for cycle #32 of Daratumumab today.   Overall, he tells me he has been feeling pretty well. He is taking Pomalyst and tolerating it well. He denies jaw pain. He denies recent infections. He denies n/v/d and reports stable ankle swellings.   Overall, he feels ready for next cycle of chemo today.   REVIEW OF SYSTEMS:  Review of Systems  Constitutional:  Negative for appetite change and fatigue.  Cardiovascular:  Negative for leg swelling.  Gastrointestinal:  Negative for diarrhea, nausea and vomiting.  Neurological:  Positive for numbness.  Psychiatric/Behavioral:  Positive for sleep disturbance.   All other systems reviewed and are negative.  PAST MEDICAL/SURGICAL HISTORY:  Past Medical History:  Diagnosis Date   Anxiety    Bone metastases ([place]) 09/10/2016   Breast Lucas ([place]) 2011   Stage IV breast Lucas; radiation and tamoxifen   Breast Lucas, male ([place])    Stage IV breast Lucas; radiation and tamoxifen  Overview:  Left breast ca with mets bone  Overview:  METS TO BONE   GERD (gastroesophageal reflux disease)    Hypertension    Macular degeneration    Multiple myeloma ([place]) 02/18/2017   Peripheral neuropathy    Past Surgical History:  Procedure Laterality Date   BACK SURGERY     C2-C3 fusion   BREAST BIOPSY Left    Lucas   CATARACT EXTRACTION W/PHACO Left 02/03/2019   Procedure: CATARACT EXTRACTION PHACO AND INTRAOCULAR LENS PLACEMENT ([place]);  Surgeon: Johnny Goldmann, MD;  Location: [facility];  Service: Ophthalmology;  Laterality: Left;  CDE: 15.89   CATARACT EXTRACTION W/PHACO Right 07/25/2021   Procedure: CATARACT EXTRACTION PHACO AND INTRAOCULAR LENS PLACEMENT (I[facility]);  Surgeon: Johnny Goldmann, MD;  Location: [facility];  Service: Ophthalmology;  Laterality: Right;  CDE   26.71   HERNIA REPAIR     PORTACATH PLACEMENT Left 06/10/2018    Procedure: INSERTION PORT-A-CATH;  Surgeon: Johnny Cagey, MD;  Location: [facility];  Service: General;  Laterality: Left;   right arm surgery     bone Lucas and rod placed    SOCIAL HISTORY:  Social History   Socioeconomic History   Marital status: Married    Spouse name: Not on file   Number of children: 3   Years of education: Not on file   Highest education level: Not on file  Occupational History   Not on file  Tobacco Use   Smoking status: Never   Smokeless tobacco: Former  Scientific laboratory technician Use: Never used  Substance and Sexual Activity   Alcohol use: Yes    Alcohol/week: 24.0 standard drinks    Types: 24 Cans of beer per week   Drug use: No   Sexual activity: Yes  Other Topics Concern   Not on file  Social History Narrative   Not on file   Social Determinants of Health   Financial Resource Strain: Not on file  Food Insecurity: Not on file  Transportation Needs: Not on file  Physical Activity: Not on file  Stress:  Not on file  Social Connections: Not on file  Intimate Partner Violence: Not on file    FAMILY HISTORY:  Family History  Problem Relation Age of Onset   Stroke Mother    Lucas Maternal Aunt        Lucas NOS; died in her 76s   Lung Lucas Maternal Uncle        smoker    CURRENT MEDICATIONS:  Current Outpatient Medications  Medication Sig Dispense Refill   acetaminophen (TYLENOL) 500 MG tablet Take 1,000 mg by mouth every 6 (six) hours as needed for moderate pain.     acyclovir (ZOVIRAX) 800 MG tablet TAKE 1 TABLET TWICE DAILY 180 tablet 3   albuterol (VENTOLIN HFA) 108 (90 Base) MCG/ACT inhaler Inhale 2 puffs into the lungs every 6 (six) hours as needed for wheezing or shortness of breath.     ALPRAZolam (XANAX) 0.5 MG tablet TAKE 1 TABLET BY MOUTH AT BEDTIME AS NEEDED FOR anxiety / SLEEP 30 tablet 5   aspirin EC 81 MG tablet Take 81 mg by mouth daily.      denosumab (XGEVA) 120 MG/1.7ML SOLN injection Inject 120 mg into the skin  every 30 (thirty) days.      dexamethasone (DECADRON) 4 MG tablet Take 20 mg by mouth every Monday.     diphenhydrAMINE (BENADRYL) 25 MG tablet Take 25 mg by mouth daily as needed for allergies.     folic acid (FOLVITE) 1 MG tablet TAKE 1 TABLET BY MOUTH DAILY 90 tablet 2   gabapentin (NEURONTIN) 300 MG capsule TAKE 1 CAPSULE THREE TIMES DAILY 180 capsule 2   Loperamide HCl (IMODIUM PO) Take by mouth as needed.     loratadine (CLARITIN) 10 MG tablet Take 1 tablet (10 mg total) by mouth daily. 90 tablet 1   metoprolol succinate (TOPROL-XL) 100 MG 24 hr tablet Take 100 mg by mouth daily.     Multiple Vitamin (MULTIVITAMIN WITH MINERALS) TABS tablet Take 1 tablet by mouth daily.      pantoprazole (PROTONIX) 40 MG tablet Take 40 mg by mouth every other day.      polyethylene glycol (MIRALAX / GLYCOLAX) packet Take 17 g by mouth daily as needed for mild constipation. 14 each 0   POMALYST 2 MG capsule TAKE 1 CAPSULE BY MOUTH EVERY DAY FOR 21 DAYS ON FOLLOWED BY 7 DAYS OFF 21 capsule 0   tamoxifen (NOLVADEX) 20 MG tablet TAKE 1 TABLET EVERY DAY 90 tablet 3   ondansetron (ZOFRAN) 8 MG tablet TAKE 1 TABLET BY MOUTH EVERY 8 HOURS AS NEEDED FOR NAUSEA AND VOMITING (Patient not taking: Reported on 01/20/2022) 30 tablet 2   No current facility-administered medications for this visit.   Facility-Administered Medications Ordered in Other Visits  Medication Dose Route Frequency Provider Last Rate Last Admin   ondansetron (ZOFRAN) 8 mg in sodium chloride 0.9 % 50 mL IVPB  8 mg Intravenous Once Lockamy, Randi L, NP-C       sodium chloride flush (NS) 0.9 % injection 10 mL  10 mL Intracatheter Once PRN Derek Jack, MD        ALLERGIES:  Allergies  Allergen Reactions   Cefepime     Suspected severe thrombocytopenia is a result of cefepime induced antigen platelet destruction    PHYSICAL EXAM:  Performance status (ECOG): 1 - Symptomatic but completely ambulatory  Vitals:   01/20/22 1026  BP: (!)  141/78  Pulse: 62  Resp: 18  Temp: 98.4 F (36.9 C)  SpO2: 99%   Wt Readings from Last 3 Encounters:  01/20/22 219 lb 12.8 oz (99.7 kg)  12/23/21 218 lb 6.4 oz (99.1 kg)  11/25/21 222 lb 12.8 oz (101.1 kg)   Physical Exam Vitals reviewed.  Constitutional:      Appearance: Normal appearance.  Cardiovascular:     Rate and Rhythm: Normal rate and regular rhythm.     Pulses: Normal pulses.     Heart sounds: Normal heart sounds.  Pulmonary:     Effort: Pulmonary effort is normal.     Breath sounds: Normal breath sounds.  Musculoskeletal:     Right lower leg: 1+ Edema present.     Left lower leg: 1+ Edema present.  Neurological:     General: No focal deficit present.     Mental Status: He is alert and oriented to person, place, and time.  Psychiatric:        Mood and Affect: Mood normal.        Behavior: Behavior normal.    LABORATORY DATA:  I have reviewed the labs as listed.  CBC Latest Ref Rng & Units 01/13/2022 12/23/2021 11/18/2021  WBC 4.0 - 10.5 K/uL 5.1 3.5(L) 8.0  Hemoglobin 13.0 - 17.0 g/dL 12.0(L) 10.9(L) 11.8(L)  Hematocrit 39.0 - 52.0 % 36.3(L) 33.1(L) 33.8(L)  Platelets 150 - 400 K/uL 150 133(L) 168   CMP Latest Ref Rng & Units 01/13/2022 12/23/2021 11/18/2021  Glucose 70 - 99 mg/dL 98 103(H) 111(H)  BUN 8 - 23 mg/dL $Remove'10 14 17  'MZwFFlG$ Creatinine 0.61 - 1.24 mg/dL 1.35(H) 1.35(H) 1.56(H)  Sodium 135 - 145 mmol/L 139 137 138  Potassium 3.5 - 5.1 mmol/L 4.4 4.6 4.2  Chloride 98 - 111 mmol/L 109 106 106  CO2 22 - 32 mmol/L $RemoveB'23 26 26  'kqGIwtJa$ Calcium 8.9 - 10.3 mg/dL 8.1(L) 8.2(L) 9.1  Total Protein 6.5 - 8.1 g/dL 5.2(L) 4.9(L) 5.3(L)  Total Bilirubin 0.3 - 1.2 mg/dL 0.7 0.2(L) 0.6  Alkaline Phos 38 - 126 U/L 28(L) 26(L) 27(L)  AST 15 - 41 U/L 14(L) 16 16  ALT 0 - 44 U/L $Remo'16 29 16    'pMYTx$ DIAGNOSTIC IMAGING:  I have independently reviewed the scans and discussed with the patient. No results found.   ASSESSMENT:  1.  Relapsed multiple myeloma: -Autologous stem cell transplant  on 10/08/2017 -Post transplant consolidation with CyCarD for 2 cycles with M spike undetectable. -Maintenance therapy with carfilzomib 70 mg per metered square days 1 and 15 every 28 days from 04/20/2018, dose reduced to 35 mg per metered square on 01/05/2019, titrated up to 56 mg per metered square on 01/09/2020. 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 -PET scan on 02/18/2020 showed new left supraclavicular lymph node at the thoracic inlet, SUV 14.2.  Multifocal osseous lesions largely improved, though residual lesions noted including right acromion, left acromion, thyroid cartilage, right sternum.  Additional lesions throughout the sternum, bilateral ribs, thoracolumbar spine, bilateral pelvis have resolved. -BMBX on 02/14/2020 shows plasma cell myeloma, 60-70% of cells.  FISH for myeloma was negative.  Cytogenetics was normal. -4 cycles of daratumumab, bortezomib and dexamethasone from 02/28/2020 through 04/29/2020. -Daratumumab, pomalidomide and dexamethasone started on 05/28/2020.  He is receiving daratumumab every 2 weeks, Pomalyst 3 mg days 1-21, dexamethasone 20 mg weekly. -Pomalidomide dose reduced to 2 mg days 1-21 around the first week of July 2021, due to severe weakness.   PLAN:  1.  Relapsed IgG kappa multiple myeloma: - Reviewed myeloma labs from  01/13/2022 which showed M spike is stable at 0.1.  Immunofixation shows IgG kappa.  Free light chain ratio is more than 1.0 with kappa light chains 1.5. - Creatinine is 1.35 and stable.  Calcium was normal.  CBC was grossly normal. - Continue pomalidomide 2 mg 3 weeks on 1 week off.  Continue Darzalex every 4 weeks.  Continue dexamethasone 20 mg 3 weeks on/1 week off. - RTC 8 weeks for follow-up with labs and treatment.   2.  Myeloma bone disease: - Continue denosumab monthly.   3.  Macrocytic anemia: - Hemoglobin is 12.0.  MCV is 110 and stable.   4.  ID prophylaxis: - Continue acyclovir twice  daily.   5.  Peripheral neuropathy: - Continue gabapentin 300 mg 3 times daily.  Neuropathy slightly worse.   6.  History of breast Lucas: - Continue tamoxifen daily.  7.  Diarrhea: -Continue Imodium as needed.  8.  Hypogammaglobulinemia: - He has low IgG levels.  If he develops recurrent infections, will consider IVIG.   Orders placed this encounter:  No orders of the defined types were placed in this encounter.    Derek Jack, MD [place] [phone]   I, Thana Ates, am acting as a scribe for Dr. Derek Jack.  I, Derek Jack MD, have reviewed the above documentation for accuracy and completeness, and I agree with the above.

## 2022-01-20 NOTE — Progress Notes (Signed)
Patient is taking Pomalyst and Tamoxifen as prescribed.  He has not missed any doses and reports no side effects at this time.

## 2022-01-20 NOTE — Patient Instructions (Signed)
[facility]  Discharge Instructions: Thank you for choosing [facility] to provide your oncology and hematology care.  If you have a lab appointment with the [facility], please come in thru the Main Entrance and check in at the main information desk.  Wear comfortable clothing and clothing appropriate for easy access to any Portacath or PICC line.   We strive to give you quality time with your provider. You may need to reschedule your appointment if you arrive late (15 or more minutes).  Arriving late affects you and other patients whose appointments are after yours.  Also, if you miss three or more appointments without notifying the office, you may be dismissed from the clinic at the providers discretion.      For prescription refill requests, have your pharmacy contact our office and allow 72 hours for refills to be completed.    Today you received the following chemotherapy and/or immunotherapy agents Daratumumab      To help prevent nausea and vomiting after your treatment, we encourage you to take your nausea medication as directed.  BELOW ARE SYMPTOMS THAT SHOULD BE REPORTED IMMEDIATELY: *FEVER GREATER THAN 100.4 F (38 C) OR HIGHER *CHILLS OR SWEATING *NAUSEA AND VOMITING THAT IS NOT CONTROLLED WITH YOUR NAUSEA MEDICATION *UNUSUAL SHORTNESS OF BREATH *UNUSUAL BRUISING OR BLEEDING *URINARY PROBLEMS (pain or burning when urinating, or frequent urination) *BOWEL PROBLEMS (unusual diarrhea, constipation, pain near the anus) TENDERNESS IN MOUTH AND THROAT WITH OR WITHOUT PRESENCE OF ULCERS (sore throat, sores in mouth, or a toothache) UNUSUAL RASH, SWELLING OR PAIN  UNUSUAL VAGINAL DISCHARGE OR ITCHING   Items with * indicate a potential emergency and should be followed up as soon as possible or go to the Emergency Department if any problems should occur.  Please show the CHEMOTHERAPY ALERT CARD or IMMUNOTHERAPY ALERT CARD at check-in to the Emergency  Department and triage nurse.  Should you have questions after your visit or need to cancel or reschedule your appointment, please contact [facility] [phone]  and follow the prompts.  Office hours are 8:00 a.m. to 4:30 p.m. Monday - Friday. Please note that voicemails left after 4:00 p.m. may not be returned until the following business day.  We are closed weekends and major holidays. You have access to a nurse at all times for urgent questions. Please call the main number to the clinic [phone] and follow the prompts.  For any non-urgent questions, you may also contact your provider using MyChart. We now offer e-Visits for anyone 74 and older to request care online for non-urgent symptoms. For details visit [email or web address].   Also download the MyChart app! Go to the app store, search "MyChart", open the app, select [facility], and log in with your MyChart username and password.  Due to Covid, a mask is required upon entering the hospital/clinic. If you do not have a mask, one will be given to you upon arrival. For doctor visits, patients may have 1 support person aged 74 or older with them. For treatment visits, patients cannot have anyone with them due to current Covid guidelines and our immunocompromised population.

## 2022-01-21 ENCOUNTER — Encounter (HOSPITAL_COMMUNITY): Payer: Self-pay | Admitting: Hematology

## 2022-01-21 ENCOUNTER — Inpatient Hospital Stay (HOSPITAL_COMMUNITY): Payer: Medicare Other

## 2022-01-21 VITALS — BP 140/75 | HR 61 | Temp 98.5°F | Resp 18 | Ht 69.0 in | Wt 221.2 lb

## 2022-01-21 DIAGNOSIS — C7951 Secondary malignant neoplasm of bone: Secondary | ICD-10-CM

## 2022-01-21 DIAGNOSIS — R197 Diarrhea, unspecified: Secondary | ICD-10-CM | POA: Diagnosis not present

## 2022-01-21 DIAGNOSIS — C9000 Multiple myeloma not having achieved remission: Secondary | ICD-10-CM

## 2022-01-21 DIAGNOSIS — D801 Nonfamilial hypogammaglobulinemia: Secondary | ICD-10-CM | POA: Diagnosis not present

## 2022-01-21 DIAGNOSIS — Z5111 Encounter for antineoplastic chemotherapy: Secondary | ICD-10-CM | POA: Diagnosis not present

## 2022-01-21 DIAGNOSIS — D649 Anemia, unspecified: Secondary | ICD-10-CM | POA: Diagnosis not present

## 2022-01-21 DIAGNOSIS — Z79810 Long term (current) use of selective estrogen receptor modulators (SERMs): Secondary | ICD-10-CM | POA: Diagnosis not present

## 2022-01-21 MED ORDER — DENOSUMAB 120 MG/1.7ML ~~LOC~~ SOLN
120.0000 mg | Freq: Once | SUBCUTANEOUS | Status: AC
  Administered 2022-01-21: 10:00:00 120 mg via SUBCUTANEOUS
  Filled 2022-01-21: qty 1.7

## 2022-01-21 NOTE — Telephone Encounter (Signed)
Chart reviewed. Pomalyst refilled per last office note with Dr. Katragadda.  

## 2022-01-21 NOTE — Patient Instructions (Signed)
[facility]  Discharge Instructions: Thank you for choosing [facility] to provide your oncology and hematology care.  If you have a lab appointment with the [facility], please come in thru the Main Entrance and check in at the main information desk.  Wear comfortable clothing and clothing appropriate for easy access to any Portacath or PICC line.   We strive to give you quality time with your provider. You may need to reschedule your appointment if you arrive late (15 or more minutes).  Arriving late affects you and other patients whose appointments are after yours.  Also, if you miss three or more appointments without notifying the office, you may be dismissed from the clinic at the providers discretion.      For prescription refill requests, have your pharmacy contact our office and allow 72 hours for refills to be completed.    Today you received the Xgeva     BELOW ARE SYMPTOMS THAT SHOULD BE REPORTED IMMEDIATELY: *FEVER GREATER THAN 100.4 F (38 C) OR HIGHER *CHILLS OR SWEATING *NAUSEA AND VOMITING THAT IS NOT CONTROLLED WITH YOUR NAUSEA MEDICATION *UNUSUAL SHORTNESS OF BREATH *UNUSUAL BRUISING OR BLEEDING *URINARY PROBLEMS (pain or burning when urinating, or frequent urination) *BOWEL PROBLEMS (unusual diarrhea, constipation, pain near the anus) TENDERNESS IN MOUTH AND THROAT WITH OR WITHOUT PRESENCE OF ULCERS (sore throat, sores in mouth, or a toothache) UNUSUAL RASH, SWELLING OR PAIN  UNUSUAL VAGINAL DISCHARGE OR ITCHING   Items with * indicate a potential emergency and should be followed up as soon as possible or go to the Emergency Department if any problems should occur.  Please show the CHEMOTHERAPY ALERT CARD or IMMUNOTHERAPY ALERT CARD at check-in to the Emergency Department and triage nurse.  Should you have questions after your visit or need to cancel or reschedule your appointment, please contact [facility] [phone]  and  follow the prompts.  Office hours are 8:00 a.m. to 4:30 p.m. Monday - Friday. Please note that voicemails left after 4:00 p.m. may not be returned until the following business day.  We are closed weekends and major holidays. You have access to a nurse at all times for urgent questions. Please call the main number to the clinic [phone] and follow the prompts.  For any non-urgent questions, you may also contact your provider using MyChart. We now offer e-Visits for anyone 26 and older to request care online for non-urgent symptoms. For details visit [email or web address].   Also download the MyChart app! Go to the app store, search "MyChart", open the app, select [facility], and log in with your MyChart username and password.  Due to Covid, a mask is required upon entering the hospital/clinic. If you do not have a mask, one will be given to you upon arrival. For doctor visits, patients may have 1 support person aged 24 or older with them. For treatment visits, patients cannot have anyone with them due to current Covid guidelines and our immunocompromised population.

## 2022-01-21 NOTE — Progress Notes (Signed)
Johnny Lucas presents today for injection per the provider's orders.  Xgeva  administration without incident; injection site WNL; see MAR for injection details. Calcium noted to be  8.1. Pt denies any tooth or jaw pain. No recent or future dental appointments at this time. Pt reports taking Calcium and Vit D supplements as directed.Patient tolerated procedure well and without incident.  No questions or complaints noted at this time.  Discharged from clinic ambulatory in stable condition. Alert and oriented x 3. F/U with [facility] as scheduled.

## 2022-01-26 ENCOUNTER — Other Ambulatory Visit (HOSPITAL_COMMUNITY): Payer: Self-pay | Admitting: Hematology

## 2022-01-26 DIAGNOSIS — C9000 Multiple myeloma not having achieved remission: Secondary | ICD-10-CM

## 2022-02-12 ENCOUNTER — Other Ambulatory Visit (HOSPITAL_COMMUNITY): Payer: Self-pay | Admitting: Hematology

## 2022-02-12 DIAGNOSIS — G4700 Insomnia, unspecified: Secondary | ICD-10-CM

## 2022-02-12 DIAGNOSIS — I10 Essential (primary) hypertension: Secondary | ICD-10-CM

## 2022-02-16 ENCOUNTER — Encounter (HOSPITAL_COMMUNITY): Payer: Self-pay | Admitting: Hematology

## 2022-02-16 DIAGNOSIS — I10 Essential (primary) hypertension: Secondary | ICD-10-CM | POA: Diagnosis not present

## 2022-02-16 DIAGNOSIS — Z853 Personal history of malignant neoplasm of breast: Secondary | ICD-10-CM | POA: Diagnosis not present

## 2022-02-16 DIAGNOSIS — Z6831 Body mass index (BMI) 31.0-31.9, adult: Secondary | ICD-10-CM | POA: Diagnosis not present

## 2022-02-16 DIAGNOSIS — C9000 Multiple myeloma not having achieved remission: Secondary | ICD-10-CM | POA: Diagnosis not present

## 2022-02-16 NOTE — Telephone Encounter (Signed)
Chart reviewed. Pomalyst refilled per last office note with Dr. Katragadda.  

## 2022-02-17 ENCOUNTER — Inpatient Hospital Stay (HOSPITAL_COMMUNITY): Payer: Medicare Other

## 2022-02-17 ENCOUNTER — Encounter (HOSPITAL_COMMUNITY): Payer: Self-pay

## 2022-02-17 ENCOUNTER — Inpatient Hospital Stay (HOSPITAL_COMMUNITY): Payer: Medicare Other | Attending: Hematology

## 2022-02-17 ENCOUNTER — Other Ambulatory Visit: Payer: Self-pay

## 2022-02-17 VITALS — BP 129/67 | HR 56 | Temp 98.3°F | Resp 18 | Ht 69.0 in | Wt 220.5 lb

## 2022-02-17 DIAGNOSIS — Z5112 Encounter for antineoplastic immunotherapy: Secondary | ICD-10-CM | POA: Diagnosis not present

## 2022-02-17 DIAGNOSIS — C9000 Multiple myeloma not having achieved remission: Secondary | ICD-10-CM | POA: Diagnosis not present

## 2022-02-17 LAB — CBC WITH DIFFERENTIAL/PLATELET
Abs Immature Granulocytes: 0.01 10*3/uL (ref 0.00–0.07)
Basophils Absolute: 0.1 10*3/uL (ref 0.0–0.1)
Basophils Relative: 3 %
Eosinophils Absolute: 0.5 10*3/uL (ref 0.0–0.5)
Eosinophils Relative: 12 %
HCT: 33.8 % — ABNORMAL LOW (ref 39.0–52.0)
Hemoglobin: 10.7 g/dL — ABNORMAL LOW (ref 13.0–17.0)
Immature Granulocytes: 0 %
Lymphocytes Relative: 19 %
Lymphs Abs: 0.8 10*3/uL (ref 0.7–4.0)
MCH: 34.4 pg — ABNORMAL HIGH (ref 26.0–34.0)
MCHC: 31.7 g/dL (ref 30.0–36.0)
MCV: 108.7 fL — ABNORMAL HIGH (ref 80.0–100.0)
Monocytes Absolute: 0.8 10*3/uL (ref 0.1–1.0)
Monocytes Relative: 19 %
Neutro Abs: 2 10*3/uL (ref 1.7–7.7)
Neutrophils Relative %: 47 %
Platelets: 134 10*3/uL — ABNORMAL LOW (ref 150–400)
RBC: 3.11 MIL/uL — ABNORMAL LOW (ref 4.22–5.81)
RDW: 15.5 % (ref 11.5–15.5)
WBC: 4.3 10*3/uL (ref 4.0–10.5)
nRBC: 0 % (ref 0.0–0.2)

## 2022-02-17 LAB — COMPREHENSIVE METABOLIC PANEL
ALT: 15 U/L (ref 0–44)
AST: 15 U/L (ref 15–41)
Albumin: 3.2 g/dL — ABNORMAL LOW (ref 3.5–5.0)
Alkaline Phosphatase: 26 U/L — ABNORMAL LOW (ref 38–126)
Anion gap: 3 — ABNORMAL LOW (ref 5–15)
BUN: 14 mg/dL (ref 8–23)
CO2: 28 mmol/L (ref 22–32)
Calcium: 8.2 mg/dL — ABNORMAL LOW (ref 8.9–10.3)
Chloride: 107 mmol/L (ref 98–111)
Creatinine, Ser: 1.37 mg/dL — ABNORMAL HIGH (ref 0.61–1.24)
GFR, Estimated: 54 mL/min — ABNORMAL LOW (ref >60)
Glucose, Bld: 98 mg/dL (ref 70–99)
Potassium: 4 mmol/L (ref 3.5–5.1)
Sodium: 138 mmol/L (ref 135–145)
Total Bilirubin: 0.7 mg/dL (ref 0.3–1.2)
Total Protein: 5.1 g/dL — ABNORMAL LOW (ref 6.5–8.1)

## 2022-02-17 LAB — MAGNESIUM: Magnesium: 1.9 mg/dL (ref 1.7–2.4)

## 2022-02-17 MED ORDER — DARATUMUMAB-HYALURONIDASE-FIHJ 1800-30000 MG-UT/15ML ~~LOC~~ SOLN
1800.0000 mg | Freq: Once | SUBCUTANEOUS | Status: AC
  Administered 2022-02-17: 1800 mg via SUBCUTANEOUS
  Filled 2022-02-17: qty 15

## 2022-02-17 NOTE — Patient Instructions (Signed)
[facility]  Discharge Instructions: Thank you for choosing [facility] to provide your oncology and hematology care.  If you have a lab appointment with the [facility], please come in thru the Main Entrance and check in at the main information desk.  Wear comfortable clothing and clothing appropriate for easy access to any Portacath or PICC line.   We strive to give you quality time with your provider. You may need to reschedule your appointment if you arrive late (15 or more minutes).  Arriving late affects you and other patients whose appointments are after yours.  Also, if you miss three or more appointments without notifying the office, you may be dismissed from the clinic at the providers discretion.      For prescription refill requests, have your pharmacy contact our office and allow 72 hours for refills to be completed.    Today you received the following : Darzalex Faspro .       To help prevent nausea and vomiting after your treatment, we encourage you to take your nausea medication as directed.  BELOW ARE SYMPTOMS THAT SHOULD BE REPORTED IMMEDIATELY: *FEVER GREATER THAN 100.4 F (38 C) OR HIGHER *CHILLS OR SWEATING *NAUSEA AND VOMITING THAT IS NOT CONTROLLED WITH YOUR NAUSEA MEDICATION *UNUSUAL SHORTNESS OF BREATH *UNUSUAL BRUISING OR BLEEDING *URINARY PROBLEMS (pain or burning when urinating, or frequent urination) *BOWEL PROBLEMS (unusual diarrhea, constipation, pain near the anus) TENDERNESS IN MOUTH AND THROAT WITH OR WITHOUT PRESENCE OF ULCERS (sore throat, sores in mouth, or a toothache) UNUSUAL RASH, SWELLING OR PAIN  UNUSUAL VAGINAL DISCHARGE OR ITCHING   Items with * indicate a potential emergency and should be followed up as soon as possible or go to the Emergency Department if any problems should occur.  Please show the CHEMOTHERAPY ALERT CARD or IMMUNOTHERAPY ALERT CARD at check-in to the Emergency Department and triage  nurse.  Should you have questions after your visit or need to cancel or reschedule your appointment, please contact [facility] [phone]  and follow the prompts.  Office hours are 8:00 a.m. to 4:30 p.m. Monday - Friday. Please note that voicemails left after 4:00 p.m. may not be returned until the following business day.  We are closed weekends and major holidays. You have access to a nurse at all times for urgent questions. Please call the main number to the clinic [phone] and follow the prompts.  For any non-urgent questions, you may also contact your provider using MyChart. We now offer e-Visits for anyone 39 and older to request care online for non-urgent symptoms. For details visit [email or web address].   Also download the MyChart app! Go to the app store, search "MyChart", open the app, select [place], and log in with your MyChart username and password.  Due to Covid, a mask is required upon entering the hospital/clinic. If you do not have a mask, one will be given to you upon arrival. For doctor visits, patients may have 1 support person aged 65 or older with them. For treatment visits, patients cannot have anyone with them due to current Covid guidelines and our immunocompromised population.

## 2022-02-17 NOTE — Progress Notes (Signed)
Patient presents today for treatment . Vital signs and labs within parameters for today's treatment. Patient took his pre-meds prior to arrival. Tylenol 650 mg, bendadryl 50 mg , and Decadron 20 mg PO.   Darzalex Faspro  given today per MD orders. Tolerated without adverse affects. Vital signs stable. No complaints at this time. Discharged from clinic ambulatory in stable condition. Alert and oriented x 3. F/U with [facility] as scheduled.

## 2022-02-18 ENCOUNTER — Inpatient Hospital Stay (HOSPITAL_COMMUNITY): Payer: Medicare Other

## 2022-02-18 VITALS — BP 143/68 | HR 63 | Temp 97.6°F | Resp 18 | Ht 69.0 in

## 2022-02-18 DIAGNOSIS — C9000 Multiple myeloma not having achieved remission: Secondary | ICD-10-CM | POA: Diagnosis not present

## 2022-02-18 DIAGNOSIS — C7951 Secondary malignant neoplasm of bone: Secondary | ICD-10-CM

## 2022-02-18 DIAGNOSIS — Z5112 Encounter for antineoplastic immunotherapy: Secondary | ICD-10-CM | POA: Diagnosis not present

## 2022-02-18 MED ORDER — DENOSUMAB 120 MG/1.7ML ~~LOC~~ SOLN
120.0000 mg | Freq: Once | SUBCUTANEOUS | Status: AC
  Administered 2022-02-18: 120 mg via SUBCUTANEOUS
  Filled 2022-02-18: qty 1.7

## 2022-02-18 NOTE — Progress Notes (Signed)
Xgeva injection given per orders. Patient tolerated it well without problems. Vitals stable and discharged home from clinic ambulatory. Follow up as scheduled.  

## 2022-02-18 NOTE — Patient Instructions (Signed)
[facility]  Discharge Instructions: ?Thank you for choosing [facility] to provide your oncology and hematology care.  ?If you have a lab appointment with the Cancer Center, please come in thru the Main Entrance and check in at the main information desk. ? ? ? ?We strive to give you quality time with your provider. You may need to reschedule your appointment if you arrive late (15 or more minutes).  Arriving late affects you and other patients whose appointments are after yours.  Also, if you miss three or more appointments without notifying the office, you may be dismissed from the clinic at the provider?s discretion.    ?  ?For prescription refill requests, have your pharmacy contact our office and allow 72 hours for refills to be completed.   ? ?  ?To help prevent nausea and vomiting after your treatment, we encourage you to take your nausea medication as directed. ? ?BELOW ARE SYMPTOMS THAT SHOULD BE REPORTED IMMEDIATELY: ?*FEVER GREATER THAN 100.4 F (38 ?C) OR HIGHER ?*CHILLS OR SWEATING ?*NAUSEA AND VOMITING THAT IS NOT CONTROLLED WITH YOUR NAUSEA MEDICATION ?*UNUSUAL SHORTNESS OF BREATH ?*UNUSUAL BRUISING OR BLEEDING ?*URINARY PROBLEMS (pain or burning when urinating, or frequent urination) ?*BOWEL PROBLEMS (unusual diarrhea, constipation, pain near the anus) ?TENDERNESS IN MOUTH AND THROAT WITH OR WITHOUT PRESENCE OF ULCERS (sore throat, sores in mouth, or a toothache) ?UNUSUAL RASH, SWELLING OR PAIN  ?UNUSUAL VAGINAL DISCHARGE OR ITCHING  ? ?Items with * indicate a potential emergency and should be followed up as soon as possible or go to the Emergency Department if any problems should occur. ? ?Please show the CHEMOTHERAPY ALERT CARD or IMMUNOTHERAPY ALERT CARD at check-in to the Emergency Department and triage nurse. ? ?Should you have questions after your visit or need to cancel or reschedule your appointment, please contact [facility] [phone]  and follow  the prompts.  Office hours are 8:00 a.m. to 4:30 p.m. Monday - Friday. Please note that voicemails left after 4:00 p.m. may not be returned until the following business day.  We are closed weekends and major holidays. You have access to a nurse at all times for urgent questions. Please call the main number to the clinic [phone] and follow the prompts. ? ?For any non-urgent questions, you may also contact your provider using MyChart. We now offer e-Visits for anyone 18 and older to request care online for non-urgent symptoms. For details visit [email or web address]. ?  ?Also download the MyChart app! Go to the app store, search "MyChart", open the app, select [facility], and log in with your MyChart username and password. ? ?Due to Covid, a mask is required upon entering the hospital/clinic. If you do not have a mask, one will be given to you upon arrival. For doctor visits, patients may have 1 support person aged 18 or older with them. For treatment visits, patients cannot have anyone with them due to current Covid guidelines and our immunocompromised population.  ?

## 2022-02-18 NOTE — Progress Notes (Signed)
Johnny Lucas presents today for Xgeva injection per the provider's orders.  Stable during administration without incident; injection site WNL; see MAR for injection details.  Patient tolerated procedure well and without incident.  No questions or complaints noted at this time.  Discharge from clinic ambulatory in stable condition.  Alert and oriented X 3.  Follow up with [facility] as scheduled.

## 2022-02-27 DIAGNOSIS — Z20822 Contact with and (suspected) exposure to covid-19: Secondary | ICD-10-CM | POA: Diagnosis not present

## 2022-03-04 DIAGNOSIS — Z131 Encounter for screening for diabetes mellitus: Secondary | ICD-10-CM | POA: Diagnosis not present

## 2022-03-04 DIAGNOSIS — Z13220 Encounter for screening for lipoid disorders: Secondary | ICD-10-CM | POA: Diagnosis not present

## 2022-03-04 DIAGNOSIS — Z1321 Encounter for screening for nutritional disorder: Secondary | ICD-10-CM | POA: Diagnosis not present

## 2022-03-04 DIAGNOSIS — Z1329 Encounter for screening for other suspected endocrine disorder: Secondary | ICD-10-CM | POA: Diagnosis not present

## 2022-03-04 DIAGNOSIS — E559 Vitamin D deficiency, unspecified: Secondary | ICD-10-CM | POA: Diagnosis not present

## 2022-03-04 DIAGNOSIS — I10 Essential (primary) hypertension: Secondary | ICD-10-CM | POA: Diagnosis not present

## 2022-03-04 DIAGNOSIS — R5383 Other fatigue: Secondary | ICD-10-CM | POA: Diagnosis not present

## 2022-03-07 ENCOUNTER — Other Ambulatory Visit (HOSPITAL_COMMUNITY): Payer: Self-pay | Admitting: Hematology

## 2022-03-07 DIAGNOSIS — I10 Essential (primary) hypertension: Secondary | ICD-10-CM

## 2022-03-07 DIAGNOSIS — G4700 Insomnia, unspecified: Secondary | ICD-10-CM

## 2022-03-11 ENCOUNTER — Inpatient Hospital Stay (HOSPITAL_COMMUNITY): Payer: Medicare Other | Attending: Hematology

## 2022-03-11 DIAGNOSIS — Z5112 Encounter for antineoplastic immunotherapy: Secondary | ICD-10-CM | POA: Diagnosis not present

## 2022-03-11 DIAGNOSIS — D801 Nonfamilial hypogammaglobulinemia: Secondary | ICD-10-CM | POA: Diagnosis not present

## 2022-03-11 DIAGNOSIS — Z9221 Personal history of antineoplastic chemotherapy: Secondary | ICD-10-CM | POA: Insufficient documentation

## 2022-03-11 DIAGNOSIS — C9000 Multiple myeloma not having achieved remission: Secondary | ICD-10-CM | POA: Insufficient documentation

## 2022-03-11 DIAGNOSIS — D539 Nutritional anemia, unspecified: Secondary | ICD-10-CM | POA: Insufficient documentation

## 2022-03-11 DIAGNOSIS — Z801 Family history of malignant neoplasm of trachea, bronchus and lung: Secondary | ICD-10-CM | POA: Diagnosis not present

## 2022-03-11 DIAGNOSIS — Z923 Personal history of irradiation: Secondary | ICD-10-CM | POA: Diagnosis not present

## 2022-03-11 DIAGNOSIS — Z853 Personal history of malignant neoplasm of breast: Secondary | ICD-10-CM | POA: Insufficient documentation

## 2022-03-11 DIAGNOSIS — Z79899 Other long term (current) drug therapy: Secondary | ICD-10-CM | POA: Diagnosis not present

## 2022-03-11 DIAGNOSIS — R197 Diarrhea, unspecified: Secondary | ICD-10-CM | POA: Insufficient documentation

## 2022-03-11 DIAGNOSIS — G629 Polyneuropathy, unspecified: Secondary | ICD-10-CM | POA: Insufficient documentation

## 2022-03-11 DIAGNOSIS — K219 Gastro-esophageal reflux disease without esophagitis: Secondary | ICD-10-CM | POA: Diagnosis not present

## 2022-03-11 LAB — CBC WITH DIFFERENTIAL/PLATELET
Abs Immature Granulocytes: 0.01 10*3/uL (ref 0.00–0.07)
Basophils Absolute: 0.1 10*3/uL (ref 0.0–0.1)
Basophils Relative: 2 %
Eosinophils Absolute: 0.3 10*3/uL (ref 0.0–0.5)
Eosinophils Relative: 6 %
HCT: 34.6 % — ABNORMAL LOW (ref 39.0–52.0)
Hemoglobin: 11 g/dL — ABNORMAL LOW (ref 13.0–17.0)
Immature Granulocytes: 0 %
Lymphocytes Relative: 15 %
Lymphs Abs: 0.9 10*3/uL (ref 0.7–4.0)
MCH: 34.7 pg — ABNORMAL HIGH (ref 26.0–34.0)
MCHC: 31.8 g/dL (ref 30.0–36.0)
MCV: 109.1 fL — ABNORMAL HIGH (ref 80.0–100.0)
Monocytes Absolute: 0.5 10*3/uL (ref 0.1–1.0)
Monocytes Relative: 8 %
Neutro Abs: 4.1 10*3/uL (ref 1.7–7.7)
Neutrophils Relative %: 69 %
Platelets: 154 10*3/uL (ref 150–400)
RBC: 3.17 MIL/uL — ABNORMAL LOW (ref 4.22–5.81)
RDW: 15.9 % — ABNORMAL HIGH (ref 11.5–15.5)
WBC: 5.9 10*3/uL (ref 4.0–10.5)
nRBC: 0 % (ref 0.0–0.2)

## 2022-03-11 LAB — COMPREHENSIVE METABOLIC PANEL
ALT: 15 U/L (ref 0–44)
AST: 14 U/L — ABNORMAL LOW (ref 15–41)
Albumin: 3.2 g/dL — ABNORMAL LOW (ref 3.5–5.0)
Alkaline Phosphatase: 29 U/L — ABNORMAL LOW (ref 38–126)
Anion gap: 5 (ref 5–15)
BUN: 13 mg/dL (ref 8–23)
CO2: 26 mmol/L (ref 22–32)
Calcium: 8.7 mg/dL — ABNORMAL LOW (ref 8.9–10.3)
Chloride: 106 mmol/L (ref 98–111)
Creatinine, Ser: 1.27 mg/dL — ABNORMAL HIGH (ref 0.61–1.24)
GFR, Estimated: 60 mL/min — ABNORMAL LOW (ref >60)
Glucose, Bld: 101 mg/dL — ABNORMAL HIGH (ref 70–99)
Potassium: 4.1 mmol/L (ref 3.5–5.1)
Sodium: 137 mmol/L (ref 135–145)
Total Bilirubin: 0.4 mg/dL (ref 0.3–1.2)
Total Protein: 5.1 g/dL — ABNORMAL LOW (ref 6.5–8.1)

## 2022-03-11 LAB — MAGNESIUM: Magnesium: 1.9 mg/dL (ref 1.7–2.4)

## 2022-03-12 ENCOUNTER — Encounter (HOSPITAL_COMMUNITY): Payer: Self-pay | Admitting: Hematology

## 2022-03-12 DIAGNOSIS — Z20828 Contact with and (suspected) exposure to other viral communicable diseases: Secondary | ICD-10-CM | POA: Diagnosis not present

## 2022-03-12 LAB — KAPPA/LAMBDA LIGHT CHAINS
Kappa free light chain: 1.3 mg/L — ABNORMAL LOW (ref 3.3–19.4)
Kappa, lambda light chain ratio: >0.87 (ref 0.26–1.65)
Lambda free light chains: <1.5 mg/L — ABNORMAL LOW (ref 5.7–26.3)

## 2022-03-12 NOTE — Telephone Encounter (Signed)
Chart reviewed. Pomalyst refilled per last office note with Dr. Katragadda.  

## 2022-03-13 LAB — PROTEIN ELECTROPHORESIS, SERUM
A/G Ratio: 1.8 — ABNORMAL HIGH (ref 0.7–1.7)
Albumin ELP: 3 g/dL (ref 2.9–4.4)
Alpha-1-Globulin: 0.3 g/dL (ref 0.0–0.4)
Alpha-2-Globulin: 0.6 g/dL (ref 0.4–1.0)
Beta Globulin: 0.8 g/dL (ref 0.7–1.3)
Gamma Globulin: 0.1 g/dL — ABNORMAL LOW (ref 0.4–1.8)
Globulin, Total: 1.7 g/dL — ABNORMAL LOW (ref 2.2–3.9)
M-Spike, %: 0.1 g/dL — ABNORMAL HIGH
Total Protein ELP: 4.7 g/dL — ABNORMAL LOW (ref 6.0–8.5)

## 2022-03-16 DIAGNOSIS — Z853 Personal history of malignant neoplasm of breast: Secondary | ICD-10-CM | POA: Diagnosis not present

## 2022-03-16 DIAGNOSIS — C9000 Multiple myeloma not having achieved remission: Secondary | ICD-10-CM | POA: Diagnosis not present

## 2022-03-16 DIAGNOSIS — D539 Nutritional anemia, unspecified: Secondary | ICD-10-CM | POA: Diagnosis not present

## 2022-03-16 DIAGNOSIS — T451X5A Adverse effect of antineoplastic and immunosuppressive drugs, initial encounter: Secondary | ICD-10-CM | POA: Diagnosis not present

## 2022-03-16 DIAGNOSIS — Z7952 Long term (current) use of systemic steroids: Secondary | ICD-10-CM | POA: Diagnosis not present

## 2022-03-16 DIAGNOSIS — N1831 Chronic kidney disease, stage 3a: Secondary | ICD-10-CM | POA: Diagnosis not present

## 2022-03-16 DIAGNOSIS — R5383 Other fatigue: Secondary | ICD-10-CM | POA: Diagnosis not present

## 2022-03-16 DIAGNOSIS — Z79810 Long term (current) use of selective estrogen receptor modulators (SERMs): Secondary | ICD-10-CM | POA: Diagnosis not present

## 2022-03-16 DIAGNOSIS — Z79899 Other long term (current) drug therapy: Secondary | ICD-10-CM | POA: Diagnosis not present

## 2022-03-16 DIAGNOSIS — Z87891 Personal history of nicotine dependence: Secondary | ICD-10-CM | POA: Diagnosis not present

## 2022-03-16 DIAGNOSIS — Z7983 Long term (current) use of bisphosphonates: Secondary | ICD-10-CM | POA: Diagnosis not present

## 2022-03-16 DIAGNOSIS — G620 Drug-induced polyneuropathy: Secondary | ICD-10-CM | POA: Diagnosis not present

## 2022-03-16 DIAGNOSIS — G629 Polyneuropathy, unspecified: Secondary | ICD-10-CM | POA: Diagnosis not present

## 2022-03-16 DIAGNOSIS — Z9484 Stem cells transplant status: Secondary | ICD-10-CM | POA: Diagnosis not present

## 2022-03-16 DIAGNOSIS — E538 Deficiency of other specified B group vitamins: Secondary | ICD-10-CM | POA: Diagnosis not present

## 2022-03-16 DIAGNOSIS — N183 Chronic kidney disease, stage 3 unspecified: Secondary | ICD-10-CM | POA: Diagnosis not present

## 2022-03-16 DIAGNOSIS — Z7961 Long term (current) use of immunomodulator: Secondary | ICD-10-CM | POA: Diagnosis not present

## 2022-03-16 LAB — IMMUNOFIXATION ELECTROPHORESIS
IgA: <5 mg/dL — ABNORMAL LOW (ref 61–437)
IgG (Immunoglobin G), Serum: 137 mg/dL — ABNORMAL LOW (ref 603–1613)
IgM (Immunoglobulin M), Srm: <5 mg/dL — ABNORMAL LOW (ref 15–143)
Total Protein ELP: 4.7 g/dL — ABNORMAL LOW (ref 6.0–8.5)

## 2022-03-17 ENCOUNTER — Other Ambulatory Visit: Payer: Self-pay

## 2022-03-17 ENCOUNTER — Inpatient Hospital Stay (HOSPITAL_COMMUNITY): Payer: Medicare Other

## 2022-03-17 ENCOUNTER — Inpatient Hospital Stay (HOSPITAL_BASED_OUTPATIENT_CLINIC_OR_DEPARTMENT_OTHER): Payer: Medicare Other | Admitting: Hematology

## 2022-03-17 VITALS — BP 136/71 | HR 60 | Temp 98.6°F | Resp 18 | Ht 69.0 in | Wt 220.5 lb

## 2022-03-17 DIAGNOSIS — R197 Diarrhea, unspecified: Secondary | ICD-10-CM | POA: Diagnosis not present

## 2022-03-17 DIAGNOSIS — D801 Nonfamilial hypogammaglobulinemia: Secondary | ICD-10-CM | POA: Diagnosis not present

## 2022-03-17 DIAGNOSIS — C9000 Multiple myeloma not having achieved remission: Secondary | ICD-10-CM | POA: Diagnosis not present

## 2022-03-17 DIAGNOSIS — Z5112 Encounter for antineoplastic immunotherapy: Secondary | ICD-10-CM | POA: Diagnosis not present

## 2022-03-17 DIAGNOSIS — Z79899 Other long term (current) drug therapy: Secondary | ICD-10-CM | POA: Diagnosis not present

## 2022-03-17 DIAGNOSIS — K219 Gastro-esophageal reflux disease without esophagitis: Secondary | ICD-10-CM | POA: Diagnosis not present

## 2022-03-17 MED ORDER — DARATUMUMAB-HYALURONIDASE-FIHJ 1800-30000 MG-UT/15ML ~~LOC~~ SOLN
1800.0000 mg | Freq: Once | SUBCUTANEOUS | Status: AC
  Administered 2022-03-17: 1800 mg via SUBCUTANEOUS
  Filled 2022-03-17: qty 15

## 2022-03-17 NOTE — Progress Notes (Signed)
Patient has been examined by Dr. Katragadda, and vital signs and labs have been reviewed. ANC, Creatinine, LFTs, hemoglobin, and platelets are within treatment parameters per M.D. - pt may proceed with treatment.    °

## 2022-03-17 NOTE — Progress Notes (Signed)
? ?Johnny Lucas ?[address] ?[place], [place] [address] ? ? ?CLINIC:  ?Medical Oncology/Hematology ? ?PCP:  ?Johnny Percy, MD ?[address] [place] [address] ?[phone] ? ? ?REASON FOR VISIT:  ?Follow-up for multiple myeloma ? ?PRIOR THERAPY:  ?1. RVD x 6 cycles from 03/01/2017 to 06/29/2017. ?2. Stem cell transplant on 09/30/2017. ?3. Carfilzomib and cyclophosphamide x 2 cycles from 02/14/2018 to 03/29/2018. ?4. Carfilzomib x 22 cycles from 04/20/2018 to 01/23/2020. ? ?NGS Results: not done ? ?CURRENT THERAPY: Darzalex Faspro monthly; Pomalyst 2 mg 3/4 weeks; Xgeva monthly ? ?BRIEF ONCOLOGIC HISTORY:  ?Oncology History  ?Breast cancer, male [facility])  ?12/31/2009 Initial Biopsy  ? Biopsy of L breast  ?  ?12/31/2009 Pathology Results  ? Invasive ductal carcinoma, ER/PR+, HER 2 negative ?  ?12/31/2009 Imaging  ? Ultrasound showing a 2.43 x 1.85 x 3 cm hypoechoic spiculated mass in the 12 o clock L breast retroareolar region ?  ?01/01/2010 -  Anti-estrogen oral therapy  ? Tamoxifen 20 mg daily ?  ?01/06/2010 Imaging  ? Bone scan abnormal uptake in the diaphysis of the R humerus, abnormal in the R third, fifth and sixth ribs, lesion also noted in the sternum. ?  ?02/03/2010 Surgery  ? Rod placement and fixation of R humerus by Dr. [name] ?  ?02/05/2010 - 02/18/2010 Radiation Therapy  ? 30Gy in 10 fractions of 3 Gy per fraction to R pathologic fracture ?  ?03/11/2010 -  Chemotherapy  ? Denosumab monthly, now every 3 months. Started at [facility] ? ?  ?06/09/2016 Imaging  ? Three hypermetabolic osseous lesions in the sternum, left ilium ?and right ilium, as discussed above, likely represent osseous ?metastases. At this time, these are not recognizable on the CT ?images. ?2. No extra skeletal metastatic disease identified in the neck, ?chest, abdomen or pelvis. ?  ?10/13/2016 Progression  ? PET shows various new and enlarging osseous metastatic lesions with no definite extra osseous metastatic disease currently identified.  ?   ?12/31/2016 Progression  ? 1. Multifocal hypermetabolic osseous metastases throughout the axial ?and proximal appendicular skeleton, which are increased in size, ?number and metabolism since 10/13/2016 PET-CT. ?2. 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 ?3. No additional sites of hypermetabolic metastatic disease. ?4. Chronic right mastoid sinusitis. ?5. Aortic atherosclerosis.  One vessel coronary atherosclerosis. ?  ?Multiple myeloma ([place])  ?02/12/2017 Bone Marrow Biopsy  ? The marrow was variably cellular with large peritrabecular aggregates of kappa restricted plasma cells (66% by aspirate, 30% by Cd138). Cytogenetics +11. ? ?  ?03/01/2017 - 06/29/2017 Chemotherapy  ? RVD ? ?  ?05/26/2017 Bone Marrow Biopsy  ? Performed at [facility]: ? ?Plasma cell myeloma in a 30% cellular marrow with decreased trilineage hematopoiesis and 42% kappa light chain restricted plasma cells on the aspirate smears and large aggregates on the core biopsy.   ?  ?07/12/2017 - 09/01/2017 Chemotherapy  ? 3 cycles of carfizolmib/cyclophosphamide/dexamethasone  ? ?  ?10/08/2017 Bone Marrow Transplant  ? Autotransplant at [facility] ?  ?02/28/2020 -  Chemotherapy  ? Patient is on Treatment Plan : Daratumumab q28d  ?   ?Multiple myeloma not having achieved remission ([place])  ?06/29/2017 Initial Diagnosis  ? Multiple myeloma not having achieved remission ([place]) ?  ?02/28/2020 -  Chemotherapy  ? Patient is on Treatment Plan : Daratumumab q28d  ?   ? ? ?CANCER STAGING: ?  Cancer Staging  ?Multiple myeloma ([place]) ?Staging form: Plasma Cell Myeloma and Plasma Cell Disorders, AJCC 8th Edition ?- Clinical stage from 05/03/2017: Beta-2-microglobulin (mg/L): 3.5, Albumin (g/dL): 3.4, ISS: Stage II, High-risk cytogenetics: Absent, LDH: Not assessed - Signed by B[facility], PA-C on 05/03/2017 ? ? ?INTERVAL HISTORY:  ?Johnny Lucas, a 76 y.o. male, returns for routine follow-up and consideration for next cycle of chemotherapy. Johnny Lucas was last seen on 01/20/2022. ? ?Due for cycle #33 of Daratumumab today.  ? ?Overall, he tells me he has been feeling pretty well. He is taking Pomalyst and tolerating it well. He denies recent infections. He denies leg swellings.  ? ?Overall, he feels ready for next cycle of chemo today.  ? ?REVIEW OF SYSTEMS:  ?Review of Systems  ?Constitutional:  Negative for appetite change and fatigue.  ?Cardiovascular:  Negative for leg swelling.  ?All other systems reviewed and are negative. ? ?PAST MEDICAL/SURGICAL HISTORY:  ?Past Medical History:  ?Diagnosis Date  ? Anxiety   ? Bone metastases ([place]) 09/10/2016  ? Breast cancer [facility]) 2011  ? Stage IV breast cancer; radiation and tamoxifen  ? Breast cancer, male [facility])   ? Stage IV breast cancer; radiation and tamoxifen  Overview:  Left breast ca with mets bone  Overview:  METS TO BONE  ? GERD (gastroesophageal reflux disease)   ? Hypertension   ? Macular degeneration   ? Multiple myeloma ([place]) 02/18/2017  ? Peripheral neuropathy   ? ?Past Surgical History:  ?Procedure Laterality Date  ? BACK SURGERY    ? C2-C3 fusion  ? BREAST BIOPSY Left   ? cancer  ? CATARACT EXTRACTION W/PHACO Left 02/03/2019  ? Procedure: CATARACT EXTRACTION PHACO AND INTRAOCULAR LENS PLACEMENT (IOC);  Surgeon: Baruch Goldmann, MD;  Location: [facility];  Service: Ophthalmology;  Laterality: Left;  CDE: 15.89  ? CATARACT EXTRACTION W/PHACO Right 07/25/2021  ? Procedure: CATARACT EXTRACTION PHACO AND INTRAOCULAR LENS PLACEMENT (IOC);  Surgeon: Baruch Goldmann, MD;  Location: [facility];  Service: Ophthalmology;  Laterality: Right;  CDE   26.71  ? HERNIA REPAIR    ? PORTACATH PLACEMENT Left 06/10/2018  ? Procedure: INSERTION PORT-A-CATH;  Surgeon: Virl Cagey, MD;  Location: [facility];  Service: General;  Laterality: Left;  ? right arm surgery    ? bone cancer and rod placed  ? ? ?SOCIAL HISTORY:  ?Social  History  ? ?Socioeconomic History  ? Marital status: Married  ?  Spouse name: Not on file  ? Number of children: 3  ? Years of education: Not on file  ? Highest education level: Not on file  ?Occupational History  ? Not on file  ?Tobacco Use  ? Smoking status: Never  ? Smokeless tobacco: Former  ?Vaping Use  ? Vaping Use: Never used  ?Substance and Sexual Activity  ? Alcohol use: Yes  ?  Alcohol/week: 24.0 standard drinks  ?  Types: 24 Cans of beer per week  ? Drug use: No  ? Sexual activity: Yes  ?Other Topics Concern  ? Not on file  ?Social History Narrative  ? Not on file  ? ?Social Determinants of Health  ? ?Financial Resource Strain: Not on file  ?Food Insecurity: Not on file  ?Transportation Needs: Not on file  ?Physical Activity: Not on file  ?Stress: Not on file  ?Social Connections: Not on file  ?Intimate Partner Violence: Not on file  ? ? ?FAMILY HISTORY:  ?Family History  ?Problem Relation Age of Onset  ?  Stroke Mother   ? Cancer Maternal Aunt   ?     cancer NOS; died in her 8s  ? Lung cancer Maternal Uncle   ?     smoker  ? ? ?CURRENT MEDICATIONS:  ?Current Outpatient Medications  ?Medication Sig Dispense Refill  ? acetaminophen (TYLENOL) 500 MG tablet Take 1,000 mg by mouth every 6 (six) hours as needed for moderate pain.    ? acyclovir (ZOVIRAX) 800 MG tablet TAKE 1 TABLET TWICE DAILY 180 tablet 3  ? albuterol (VENTOLIN HFA) 108 (90 Base) MCG/ACT inhaler Inhale 2 puffs into the lungs every 6 (six) hours as needed for wheezing or shortness of breath.    ? ALPRAZolam (XANAX) 0.5 MG tablet TAKE 1 TABLET BY MOUTH AT BEDTIME AS NEEDED FOR anxiety / SLEEP 30 tablet 5  ? aspirin EC 81 MG tablet Take 81 mg by mouth daily.     ? denosumab (XGEVA) 120 MG/1.7ML SOLN injection Inject 120 mg into the skin every 30 (thirty) days.     ? dexamethasone (DECADRON) 4 MG tablet Take 20 mg by mouth every Monday.    ? diphenhydrAMINE (BENADRYL) 25 MG tablet Take 25 mg by mouth daily as needed for allergies.    ? folic acid  (FOLVITE) 1 MG tablet TAKE 1 TABLET BY MOUTH DAILY 90 tablet 2  ? gabapentin (NEURONTIN) 300 MG capsule TAKE 1 CAPSULE THREE TIMES DAILY 180 capsule 2  ? Loperamide HCl (IMODIUM PO) Take by mouth as neede

## 2022-03-17 NOTE — Progress Notes (Signed)
Patient is taking Pomalyst and tamoxifen as prescribed.  He has not missed any doses and reports no side effects at this time.  ? ?

## 2022-03-17 NOTE — Patient Instructions (Signed)
[facility]  Discharge Instructions: ?Thank you for choosing [facility] to provide your oncology and hematology care.  ?If you have a lab appointment with the [facility], please come in thru the Main Entrance and check in at the main information desk. ? ?Wear comfortable clothing and clothing appropriate for easy access to any Portacath or PICC line.  ? ?We strive to give you quality time with your provider. You may need to reschedule your appointment if you arrive late (15 or more minutes).  Arriving late affects you and other patients whose appointments are after yours.  Also, if you miss three or more appointments without notifying the office, you may be dismissed from the clinic at the provider?s discretion.    ?  ?For prescription refill requests, have your pharmacy contact our office and allow 72 hours for refills to be completed.   ? ?Today you received the following chemotherapy and/or immunotherapy agents Daratumumab, return as scheduled.  ?  ?To help prevent nausea and vomiting after your treatment, we encourage you to take your nausea medication as directed. ? ?BELOW ARE SYMPTOMS THAT SHOULD BE REPORTED IMMEDIATELY: ?*FEVER GREATER THAN 100.4 F (38 ?C) OR HIGHER ?*CHILLS OR SWEATING ?*NAUSEA AND VOMITING THAT IS NOT CONTROLLED WITH YOUR NAUSEA MEDICATION ?*UNUSUAL SHORTNESS OF BREATH ?*UNUSUAL BRUISING OR BLEEDING ?*URINARY PROBLEMS (pain or burning when urinating, or frequent urination) ?*BOWEL PROBLEMS (unusual diarrhea, constipation, pain near the anus) ?TENDERNESS IN MOUTH AND THROAT WITH OR WITHOUT PRESENCE OF ULCERS (sore throat, sores in mouth, or a toothache) ?UNUSUAL RASH, SWELLING OR PAIN  ?UNUSUAL VAGINAL DISCHARGE OR ITCHING  ? ?Items with * indicate a potential emergency and should be followed up as soon as possible or go to the Emergency Department if any problems should occur. ? ?Please show the CHEMOTHERAPY ALERT CARD or IMMUNOTHERAPY ALERT CARD at check-in  to the Emergency Department and triage nurse. ? ?Should you have questions after your visit or need to cancel or reschedule your appointment, please contact [facility] [phone]  and follow the prompts.  Office hours are 8:00 a.m. to 4:30 p.m. Monday - Friday. Please note that voicemails left after 4:00 p.m. may not be returned until the following business day.  We are closed weekends and major holidays. You have access to a nurse at all times for urgent questions. Please call the main number to the clinic [phone] and follow the prompts. ? ?For any non-urgent questions, you may also contact your provider using MyChart. We now offer e-Visits for anyone 58 and older to request care online for non-urgent symptoms. For details visit [email or web address]. ?  ?Also download the MyChart app! Go to the app store, search "MyChart", open the app, select [facility], and log in with your MyChart username and password. ? ?Due to Covid, a mask is required upon entering the hospital/clinic. If you do not have a mask, one will be given to you upon arrival. For doctor visits, patients may have 1 support person aged 73 or older with them. For treatment visits, patients cannot have anyone with them due to current Covid guidelines and our immunocompromised population.  ?

## 2022-03-17 NOTE — Progress Notes (Signed)
Patient okay for treatment today per Dr. Delton Coombes. Patient states he forgot to take pre-meds at home and would like to take them when he gets home, Per Dr. Delton Coombes patient okay to take pre-meds when he gets home. Patient tolerated Daratumumab injection with no complaints voiced. See MAR for details. Lab reviewed. Injection site clean and dry with no bruising or swelling noted at site. Band aid applied. Vss with discharge and left in satisfactory condition with nos/s of distress noted.   ?

## 2022-03-17 NOTE — Patient Instructions (Signed)
[facility] at [facility] ?Discharge Instructions ? ? ?You were seen and examined today by Dr. Delton Coombes. ? ?He reviewed your lab results with you which are normal/stable. ? ?We will proceed with your treatment today. ? ?Return as scheduled for lab work, treatments, and office visit.  ? ? ?Thank you for choosing [facility] at [facility] to provide your oncology and hematology care.  To afford each patient quality time with our provider, please arrive at least 15 minutes before your scheduled appointment time.  ? ?If you have a lab appointment with the [facility] please come in thru the Main Entrance and check in at the main information desk. ? ?You need to re-schedule your appointment should you arrive 10 or more minutes late.  We strive to give you quality time with our providers, and arriving late affects you and other patients whose appointments are after yours.  Also, if you no show three or more times for appointments you may be dismissed from the clinic at the providers discretion.     ?Again, thank you for choosing [facility].  Our hope is that these requests will decrease the amount of time that you wait before being seen by our physicians.       ?_____________________________________________________________ ? ?Should you have questions after your visit to [facility], please contact our office at [phone] and follow the prompts.  Our office hours are 8:00 a.m. and 4:30 p.m. Monday - Friday.  Please note that voicemails left after 4:00 p.m. may not be returned until the following business day.  We are closed weekends and major holidays.  You do have access to a nurse 24-7, just call the main number to the clinic [phone] and do not press any options, hold on the line and a nurse will answer the phone.   ? ?For prescription refill requests, have your pharmacy contact our office and allow 72 hours.   ? ?Due to Covid, you  will need to wear a mask upon entering the hospital. If you do not have a mask, a mask will be given to you at the Main Entrance upon arrival. For doctor visits, patients may have 1 support person age 5 or older with them. For treatment visits, patients can not have anyone with them due to social distancing guidelines and our immunocompromised population.  ? ?   ?

## 2022-03-18 ENCOUNTER — Inpatient Hospital Stay (HOSPITAL_COMMUNITY): Payer: Medicare Other

## 2022-03-18 ENCOUNTER — Ambulatory Visit (HOSPITAL_COMMUNITY): Payer: Medicare Other

## 2022-03-18 VITALS — BP 139/68 | HR 59 | Temp 98.8°F | Resp 18 | Wt 222.0 lb

## 2022-03-18 DIAGNOSIS — C9000 Multiple myeloma not having achieved remission: Secondary | ICD-10-CM

## 2022-03-18 DIAGNOSIS — Z79899 Other long term (current) drug therapy: Secondary | ICD-10-CM | POA: Diagnosis not present

## 2022-03-18 DIAGNOSIS — Z5112 Encounter for antineoplastic immunotherapy: Secondary | ICD-10-CM | POA: Diagnosis not present

## 2022-03-18 DIAGNOSIS — C7951 Secondary malignant neoplasm of bone: Secondary | ICD-10-CM

## 2022-03-18 DIAGNOSIS — D801 Nonfamilial hypogammaglobulinemia: Secondary | ICD-10-CM | POA: Diagnosis not present

## 2022-03-18 DIAGNOSIS — Z20822 Contact with and (suspected) exposure to covid-19: Secondary | ICD-10-CM | POA: Diagnosis not present

## 2022-03-18 DIAGNOSIS — R197 Diarrhea, unspecified: Secondary | ICD-10-CM | POA: Diagnosis not present

## 2022-03-18 DIAGNOSIS — K219 Gastro-esophageal reflux disease without esophagitis: Secondary | ICD-10-CM | POA: Diagnosis not present

## 2022-03-18 MED ORDER — DENOSUMAB 120 MG/1.7ML ~~LOC~~ SOLN
120.0000 mg | Freq: Once | SUBCUTANEOUS | Status: AC
  Administered 2022-03-18: 120 mg via SUBCUTANEOUS
  Filled 2022-03-18: qty 1.7

## 2022-03-18 NOTE — Patient Instructions (Signed)
[facility]  Discharge Instructions: Thank you for choosing [facility] to provide your oncology and hematology care.  If you have a lab appointment with the Cancer Center, please come in thru the Main Entrance and check in at the main information desk.  Wear comfortable clothing and clothing appropriate for easy access to any Portacath or PICC line.   We strive to give you quality time with your provider. You may need to reschedule your appointment if you arrive late (15 or more minutes).  Arriving late affects you and other patients whose appointments are after yours.  Also, if you miss three or more appointments without notifying the office, you may be dismissed from the clinic at the provider's discretion.      For prescription refill requests, have your pharmacy contact our office and allow 72 hours for refills to be completed.    Today you received the following chemotherapy and/or immunotherapy agents Xgeva      To help prevent nausea and vomiting after your treatment, we encourage you to take your nausea medication as directed.  BELOW ARE SYMPTOMS THAT SHOULD BE REPORTED IMMEDIATELY: *FEVER GREATER THAN 100.4 F (38 C) OR HIGHER *CHILLS OR SWEATING *NAUSEA AND VOMITING THAT IS NOT CONTROLLED WITH YOUR NAUSEA MEDICATION *UNUSUAL SHORTNESS OF BREATH *UNUSUAL BRUISING OR BLEEDING *URINARY PROBLEMS (pain or burning when urinating, or frequent urination) *BOWEL PROBLEMS (unusual diarrhea, constipation, pain near the anus) TENDERNESS IN MOUTH AND THROAT WITH OR WITHOUT PRESENCE OF ULCERS (sore throat, sores in mouth, or a toothache) UNUSUAL RASH, SWELLING OR PAIN  UNUSUAL VAGINAL DISCHARGE OR ITCHING   Items with * indicate a potential emergency and should be followed up as soon as possible or go to the Emergency Department if any problems should occur.  Please show the CHEMOTHERAPY ALERT CARD or IMMUNOTHERAPY ALERT CARD at check-in to the Emergency Department  and triage nurse.  Should you have questions after your visit or need to cancel or reschedule your appointment, please contact [facility] [phone]  and follow the prompts.  Office hours are 8:00 a.m. to 4:30 p.m. Monday - Friday. Please note that voicemails left after 4:00 p.m. may not be returned until the following business day.  We are closed weekends and major holidays. You have access to a nurse at all times for urgent questions. Please call the main number to the clinic [phone] and follow the prompts.  For any non-urgent questions, you may also contact your provider using MyChart. We now offer e-Visits for anyone 18 and older to request care online for non-urgent symptoms. For details visit [email or web address].   Also download the MyChart app! Go to the app store, search "MyChart", open the app, select [facility], and log in with your MyChart username and password.  Due to Covid, a mask is required upon entering the hospital/clinic. If you do not have a mask, one will be given to you upon arrival. For doctor visits, patients may have 1 support person aged 18 or older with them. For treatment visits, patients cannot have anyone with them due to current Covid guidelines and our immunocompromised population.  

## 2022-03-18 NOTE — Progress Notes (Signed)
Johnny Lucas presents today for Xgeva injection per the provider's orders.  Patient has been taking Calcium and Vitamin D supplements.  Stable during administration without incident; injection site WNL; see MAR for injection details.  Patient tolerated procedure well and without incident.  No questions or complaints noted at this time. Discharge from clinic ambulatory in stable condition.  Alert and oriented X 3.  Follow up with [facility] as scheduled.  ?

## 2022-03-28 DIAGNOSIS — Z20828 Contact with and (suspected) exposure to other viral communicable diseases: Secondary | ICD-10-CM | POA: Diagnosis not present

## 2022-03-29 ENCOUNTER — Encounter (HOSPITAL_COMMUNITY): Payer: Self-pay | Admitting: Hematology

## 2022-04-04 ENCOUNTER — Other Ambulatory Visit (HOSPITAL_COMMUNITY): Payer: Self-pay | Admitting: Hematology

## 2022-04-04 DIAGNOSIS — I10 Essential (primary) hypertension: Secondary | ICD-10-CM

## 2022-04-04 DIAGNOSIS — G4700 Insomnia, unspecified: Secondary | ICD-10-CM

## 2022-04-10 ENCOUNTER — Other Ambulatory Visit (HOSPITAL_COMMUNITY): Payer: Self-pay

## 2022-04-10 DIAGNOSIS — Z20828 Contact with and (suspected) exposure to other viral communicable diseases: Secondary | ICD-10-CM | POA: Diagnosis not present

## 2022-04-10 DIAGNOSIS — G4700 Insomnia, unspecified: Secondary | ICD-10-CM

## 2022-04-10 DIAGNOSIS — I10 Essential (primary) hypertension: Secondary | ICD-10-CM

## 2022-04-10 MED ORDER — POMALIDOMIDE 2 MG PO CAPS: ORAL_CAPSULE | ORAL | 0 refills | Status: DC

## 2022-04-10 NOTE — Telephone Encounter (Signed)
Chart reviewed. Pomalyst refilled per last office note with Dr. Katragadda.  

## 2022-04-13 ENCOUNTER — Other Ambulatory Visit (HOSPITAL_COMMUNITY): Payer: Self-pay

## 2022-04-13 ENCOUNTER — Encounter (HOSPITAL_COMMUNITY): Payer: Self-pay | Admitting: Hematology

## 2022-04-14 ENCOUNTER — Encounter (HOSPITAL_COMMUNITY): Payer: Self-pay

## 2022-04-14 ENCOUNTER — Inpatient Hospital Stay (HOSPITAL_COMMUNITY): Payer: Medicare Other

## 2022-04-14 ENCOUNTER — Inpatient Hospital Stay (HOSPITAL_COMMUNITY): Payer: Medicare Other | Attending: Hematology

## 2022-04-14 VITALS — BP 160/68 | HR 61 | Temp 98.2°F | Resp 18 | Wt 217.0 lb

## 2022-04-14 DIAGNOSIS — D649 Anemia, unspecified: Secondary | ICD-10-CM | POA: Insufficient documentation

## 2022-04-14 DIAGNOSIS — Z5112 Encounter for antineoplastic immunotherapy: Secondary | ICD-10-CM | POA: Diagnosis not present

## 2022-04-14 DIAGNOSIS — C9000 Multiple myeloma not having achieved remission: Secondary | ICD-10-CM

## 2022-04-14 DIAGNOSIS — C9002 Multiple myeloma in relapse: Secondary | ICD-10-CM | POA: Diagnosis not present

## 2022-04-14 LAB — CBC WITH DIFFERENTIAL/PLATELET
Abs Immature Granulocytes: 0.02 10*3/uL (ref 0.00–0.07)
Basophils Absolute: 0.1 10*3/uL (ref 0.0–0.1)
Basophils Relative: 3 %
Eosinophils Absolute: 0.3 10*3/uL (ref 0.0–0.5)
Eosinophils Relative: 8 %
HCT: 32.9 % — ABNORMAL LOW (ref 39.0–52.0)
Hemoglobin: 11 g/dL — ABNORMAL LOW (ref 13.0–17.0)
Immature Granulocytes: 1 %
Lymphocytes Relative: 21 %
Lymphs Abs: 0.8 10*3/uL (ref 0.7–4.0)
MCH: 36.1 pg — ABNORMAL HIGH (ref 26.0–34.0)
MCHC: 33.4 g/dL (ref 30.0–36.0)
MCV: 107.9 fL — ABNORMAL HIGH (ref 80.0–100.0)
Monocytes Absolute: 0.8 10*3/uL (ref 0.1–1.0)
Monocytes Relative: 20 %
Neutro Abs: 1.8 10*3/uL (ref 1.7–7.7)
Neutrophils Relative %: 47 %
Platelets: 138 10*3/uL — ABNORMAL LOW (ref 150–400)
RBC: 3.05 MIL/uL — ABNORMAL LOW (ref 4.22–5.81)
RDW: 15.2 % (ref 11.5–15.5)
WBC: 3.8 10*3/uL — ABNORMAL LOW (ref 4.0–10.5)
nRBC: 0 % (ref 0.0–0.2)

## 2022-04-14 LAB — COMPREHENSIVE METABOLIC PANEL
ALT: 13 U/L (ref 0–44)
AST: 15 U/L (ref 15–41)
Albumin: 3.4 g/dL — ABNORMAL LOW (ref 3.5–5.0)
Alkaline Phosphatase: 29 U/L — ABNORMAL LOW (ref 38–126)
Anion gap: 6 (ref 5–15)
BUN: 15 mg/dL (ref 8–23)
CO2: 26 mmol/L (ref 22–32)
Calcium: 9 mg/dL (ref 8.9–10.3)
Chloride: 108 mmol/L (ref 98–111)
Creatinine, Ser: 1.33 mg/dL — ABNORMAL HIGH (ref 0.61–1.24)
GFR, Estimated: 56 mL/min — ABNORMAL LOW (ref >60)
Glucose, Bld: 97 mg/dL (ref 70–99)
Potassium: 4.2 mmol/L (ref 3.5–5.1)
Sodium: 140 mmol/L (ref 135–145)
Total Bilirubin: 0.5 mg/dL (ref 0.3–1.2)
Total Protein: 5.2 g/dL — ABNORMAL LOW (ref 6.5–8.1)

## 2022-04-14 LAB — MAGNESIUM: Magnesium: 1.8 mg/dL (ref 1.7–2.4)

## 2022-04-14 MED ORDER — DARATUMUMAB-HYALURONIDASE-FIHJ 1800-30000 MG-UT/15ML ~~LOC~~ SOLN
1800.0000 mg | Freq: Once | SUBCUTANEOUS | Status: AC
  Administered 2022-04-14: 1800 mg via SUBCUTANEOUS
  Filled 2022-04-14: qty 15

## 2022-04-14 NOTE — Progress Notes (Signed)
Patient presents today for Johnny Lucas, patient reports taking pre-medications at home. Patient tolerated Daratumumab injection with no complaints voiced. See MAR for details. Lab reviewed. Injection site clean and dry with no bruising or swelling noted at site. Band aid applied. Vss with discharge and left in satisfactory condition with nos/s of distress noted.   ?

## 2022-04-14 NOTE — Patient Instructions (Signed)
[facility]  Discharge Instructions: ?Thank you for choosing [facility] to provide your oncology and hematology care.  ?If you have a lab appointment with the [facility], please come in thru the Main Entrance and check in at the main information desk. ? ?Wear comfortable clothing and clothing appropriate for easy access to any Portacath or PICC line.  ? ?We strive to give you quality time with your provider. You may need to reschedule your appointment if you arrive late (15 or more minutes).  Arriving late affects you and other patients whose appointments are after yours.  Also, if you miss three or more appointments without notifying the office, you may be dismissed from the clinic at the provider?s discretion.    ?  ?For prescription refill requests, have your pharmacy contact our office and allow 72 hours for refills to be completed.   ? ?Today you received the following chemotherapy and/or immunotherapy agents Daratumumab, return as scheduled. ?  ?To help prevent nausea and vomiting after your treatment, we encourage you to take your nausea medication as directed. ? ?BELOW ARE SYMPTOMS THAT SHOULD BE REPORTED IMMEDIATELY: ?*FEVER GREATER THAN 100.4 F (38 ?C) OR HIGHER ?*CHILLS OR SWEATING ?*NAUSEA AND VOMITING THAT IS NOT CONTROLLED WITH YOUR NAUSEA MEDICATION ?*UNUSUAL SHORTNESS OF BREATH ?*UNUSUAL BRUISING OR BLEEDING ?*URINARY PROBLEMS (pain or burning when urinating, or frequent urination) ?*BOWEL PROBLEMS (unusual diarrhea, constipation, pain near the anus) ?TENDERNESS IN MOUTH AND THROAT WITH OR WITHOUT PRESENCE OF ULCERS (sore throat, sores in mouth, or a toothache) ?UNUSUAL RASH, SWELLING OR PAIN  ?UNUSUAL VAGINAL DISCHARGE OR ITCHING  ? ?Items with * indicate a potential emergency and should be followed up as soon as possible or go to the Emergency Department if any problems should occur. ? ?Please show the CHEMOTHERAPY ALERT CARD or IMMUNOTHERAPY ALERT CARD at check-in to  the Emergency Department and triage nurse. ? ?Should you have questions after your visit or need to cancel or reschedule your appointment, please contact [facility] [phone]  and follow the prompts.  Office hours are 8:00 a.m. to 4:30 p.m. Monday - Friday. Please note that voicemails left after 4:00 p.m. may not be returned until the following business day.  We are closed weekends and major holidays. You have access to a nurse at all times for urgent questions. Please call the main number to the clinic [phone] and follow the prompts. ? ?For any non-urgent questions, you may also contact your provider using MyChart. We now offer e-Visits for anyone 32 and older to request care online for non-urgent symptoms. For details visit [email or web address]. ?  ?Also download the MyChart app! Go to the app store, search "MyChart", open the app, select [facility], and log in with your MyChart username and password. ? ?Due to Covid, a mask is required upon entering the hospital/clinic. If you do not have a mask, one will be given to you upon arrival. For doctor visits, patients may have 1 support person aged 74 or older with them. For treatment visits, patients cannot have anyone with them due to current Covid guidelines and our immunocompromised population.  ?

## 2022-04-15 ENCOUNTER — Inpatient Hospital Stay (HOSPITAL_COMMUNITY): Payer: Medicare Other

## 2022-04-15 ENCOUNTER — Telehealth (HOSPITAL_COMMUNITY): Payer: Self-pay | Admitting: Pharmacy Technician

## 2022-04-15 ENCOUNTER — Encounter (HOSPITAL_COMMUNITY): Payer: Self-pay

## 2022-04-15 VITALS — BP 155/71 | HR 59 | Temp 97.8°F | Resp 18

## 2022-04-15 DIAGNOSIS — C9000 Multiple myeloma not having achieved remission: Secondary | ICD-10-CM

## 2022-04-15 DIAGNOSIS — C7951 Secondary malignant neoplasm of bone: Secondary | ICD-10-CM

## 2022-04-15 DIAGNOSIS — Z5112 Encounter for antineoplastic immunotherapy: Secondary | ICD-10-CM | POA: Diagnosis not present

## 2022-04-15 DIAGNOSIS — C9002 Multiple myeloma in relapse: Secondary | ICD-10-CM | POA: Diagnosis not present

## 2022-04-15 DIAGNOSIS — D649 Anemia, unspecified: Secondary | ICD-10-CM | POA: Diagnosis not present

## 2022-04-15 MED ORDER — DENOSUMAB 120 MG/1.7ML ~~LOC~~ SOLN
120.0000 mg | Freq: Once | SUBCUTANEOUS | Status: AC
  Administered 2022-04-15: 120 mg via SUBCUTANEOUS
  Filled 2022-04-15: qty 1.7

## 2022-04-15 NOTE — Patient Instructions (Signed)
[facility]  Discharge Instructions: Thank you for choosing [facility] to provide your oncology and hematology care.  If you have a lab appointment with the Cancer Center, please come in thru the Main Entrance and check in at the main information desk.  Wear comfortable clothing and clothing appropriate for easy access to any Portacath or PICC line.   We strive to give you quality time with your provider. You may need to reschedule your appointment if you arrive late (15 or more minutes).  Arriving late affects you and other patients whose appointments are after yours.  Also, if you miss three or more appointments without notifying the office, you may be dismissed from the clinic at the provider's discretion.      For prescription refill requests, have your pharmacy contact our office and allow 72 hours for refills to be completed.        To help prevent nausea and vomiting after your treatment, we encourage you to take your nausea medication as directed.  BELOW ARE SYMPTOMS THAT SHOULD BE REPORTED IMMEDIATELY: *FEVER GREATER THAN 100.4 F (38 C) OR HIGHER *CHILLS OR SWEATING *NAUSEA AND VOMITING THAT IS NOT CONTROLLED WITH YOUR NAUSEA MEDICATION *UNUSUAL SHORTNESS OF BREATH *UNUSUAL BRUISING OR BLEEDING *URINARY PROBLEMS (pain or burning when urinating, or frequent urination) *BOWEL PROBLEMS (unusual diarrhea, constipation, pain near the anus) TENDERNESS IN MOUTH AND THROAT WITH OR WITHOUT PRESENCE OF ULCERS (sore throat, sores in mouth, or a toothache) UNUSUAL RASH, SWELLING OR PAIN  UNUSUAL VAGINAL DISCHARGE OR ITCHING   Items with * indicate a potential emergency and should be followed up as soon as possible or go to the Emergency Department if any problems should occur.  Please show the CHEMOTHERAPY ALERT CARD or IMMUNOTHERAPY ALERT CARD at check-in to the Emergency Department and triage nurse.  Should you have questions after your visit or need to cancel  or reschedule your appointment, please contact [facility] [phone]  and follow the prompts.  Office hours are 8:00 a.m. to 4:30 p.m. Monday - Friday. Please note that voicemails left after 4:00 p.m. may not be returned until the following business day.  We are closed weekends and major holidays. You have access to a nurse at all times for urgent questions. Please call the main number to the clinic [phone] and follow the prompts.  For any non-urgent questions, you may also contact your provider using MyChart. We now offer e-Visits for anyone 18 and older to request care online for non-urgent symptoms. For details visit [email or web address].   Also download the MyChart app! Go to the app store, search "MyChart", open the app, select [facility], and log in with your MyChart username and password.  Due to Covid, a mask is required upon entering the hospital/clinic. If you do not have a mask, one will be given to you upon arrival. For doctor visits, patients may have 1 support person aged 18 or older with them. For treatment visits, patients cannot have anyone with them due to current Covid guidelines and our immunocompromised population.  

## 2022-04-15 NOTE — Progress Notes (Signed)
Patient taking calcium as directed.  Denied tooth, jaw, and leg pain.  No recent or upcoming dental visits.  Labs reviewed.  Patient tolerated injection with no complaints voiced.  See MAR for details.  Patient stable during and after injection.  Site clean and dry with no bruising or swelling noted.  Band aid applied.  Vss with discharge and left in satisfactory condition with no s/s of distress noted.   

## 2022-04-16 NOTE — Telephone Encounter (Signed)
Received email from Jimmye Norman, [facility] Patient Access Specialist, that patient has been referred to [facility] for review. Will allow 24-48 hours for determination from [employer]. ? ?[employer] phone number for follow up is [phone]. ? ?Dennison Nancy CPHT ?Specialty Pharmacy Patient Advocate ?[place] ?Phone [phone] ?Fax [phone] ?04/16/2022 1:44 PM ? ?

## 2022-04-16 NOTE — Telephone Encounter (Signed)
Oral Oncology Patient Advocate Encounter ? ?Received message from Jene Every, RN, that patient was in need of medication assistance for Pomalyst.  ? ?On 07/01/87, emailed application through [place] for [place] Patient Assistance for patient to complete in an effort to reduce patient's out of pocket expense for Pomalyst to $0.   ? ?Patient has issues with vision and could not complete online request.  ? ?Patient was in the office on 04/14/22, and requested help with application.  I emailed the application to Diane to have patient complete.  ? ?Application completed and faxed to [phone].  ? ?[employer] Access Support phone number for follow up is [phone].  ? ?This encounter will be updated until final determination.  ? ?Dennison Nancy CPHT ?Specialty Pharmacy Patient Advocate ?[place] ?Phone [phone] ?Fax [phone] ?04/16/2022 1:37 PM ? ?

## 2022-04-17 ENCOUNTER — Other Ambulatory Visit (HOSPITAL_COMMUNITY): Payer: Self-pay

## 2022-04-17 DIAGNOSIS — I10 Essential (primary) hypertension: Secondary | ICD-10-CM

## 2022-04-17 DIAGNOSIS — G4700 Insomnia, unspecified: Secondary | ICD-10-CM

## 2022-04-17 MED ORDER — POMALIDOMIDE 2 MG PO CAPS: ORAL_CAPSULE | ORAL | 0 refills | Status: DC

## 2022-04-17 NOTE — Telephone Encounter (Signed)
Oral Oncology Patient Advocate Encounter ? ?Received notification from [facility] that patient has been successfully enrolled into their program to receive Pomalyst from the manufacturer at $0 out of pocket until 12/27/22.  ?  ?I called and spoke with patient.  He knows we will have to re-apply.  ? ?Specialty Pharmacy that will dispense medication is [facility] by [employer]. ? ?Patient knows to call the office with questions or concerns. ?  ?Oral Oncology Clinic will continue to follow. ? ?Dennison Nancy CPHT ?Specialty Pharmacy Patient Advocate ?[place] ?Phone [phone] ?Fax [phone] ?04/17/2022 9:37 AM ? ?

## 2022-04-22 DIAGNOSIS — Z20822 Contact with and (suspected) exposure to covid-19: Secondary | ICD-10-CM | POA: Diagnosis not present

## 2022-04-30 ENCOUNTER — Other Ambulatory Visit (HOSPITAL_COMMUNITY): Payer: Self-pay | Admitting: Hematology

## 2022-04-30 DIAGNOSIS — Z20822 Contact with and (suspected) exposure to covid-19: Secondary | ICD-10-CM | POA: Diagnosis not present

## 2022-04-30 DIAGNOSIS — C9000 Multiple myeloma not having achieved remission: Secondary | ICD-10-CM

## 2022-05-01 ENCOUNTER — Encounter (HOSPITAL_COMMUNITY): Payer: Self-pay

## 2022-05-01 NOTE — Progress Notes (Signed)
VM received from patient requesting a call back. Unable to reach patient at this time. VM left. ?

## 2022-05-04 ENCOUNTER — Encounter (HOSPITAL_COMMUNITY): Payer: Self-pay

## 2022-05-04 DIAGNOSIS — Z20822 Contact with and (suspected) exposure to covid-19: Secondary | ICD-10-CM | POA: Diagnosis not present

## 2022-05-11 ENCOUNTER — Other Ambulatory Visit (HOSPITAL_COMMUNITY): Payer: Self-pay | Admitting: Hematology

## 2022-05-12 ENCOUNTER — Inpatient Hospital Stay (HOSPITAL_COMMUNITY): Payer: Medicare Other

## 2022-05-12 ENCOUNTER — Encounter (HOSPITAL_COMMUNITY): Payer: Self-pay | Admitting: Hematology

## 2022-05-12 ENCOUNTER — Inpatient Hospital Stay (HOSPITAL_COMMUNITY): Payer: Medicare Other | Attending: Hematology

## 2022-05-12 VITALS — BP 149/77 | HR 55 | Temp 98.1°F | Resp 16 | Wt 220.0 lb

## 2022-05-12 DIAGNOSIS — C9000 Multiple myeloma not having achieved remission: Secondary | ICD-10-CM

## 2022-05-12 DIAGNOSIS — Z5112 Encounter for antineoplastic immunotherapy: Secondary | ICD-10-CM | POA: Insufficient documentation

## 2022-05-12 DIAGNOSIS — Z79899 Other long term (current) drug therapy: Secondary | ICD-10-CM | POA: Diagnosis not present

## 2022-05-12 LAB — CBC WITH DIFFERENTIAL/PLATELET
Abs Immature Granulocytes: 0 10*3/uL (ref 0.00–0.07)
Basophils Absolute: 0.1 10*3/uL (ref 0.0–0.1)
Basophils Relative: 4 %
Eosinophils Absolute: 0.4 10*3/uL (ref 0.0–0.5)
Eosinophils Relative: 11 %
HCT: 31.7 % — ABNORMAL LOW (ref 39.0–52.0)
Hemoglobin: 10.5 g/dL — ABNORMAL LOW (ref 13.0–17.0)
Immature Granulocytes: 0 %
Lymphocytes Relative: 24 %
Lymphs Abs: 0.8 10*3/uL (ref 0.7–4.0)
MCH: 35.6 pg — ABNORMAL HIGH (ref 26.0–34.0)
MCHC: 33.1 g/dL (ref 30.0–36.0)
MCV: 107.5 fL — ABNORMAL HIGH (ref 80.0–100.0)
Monocytes Absolute: 0.7 10*3/uL (ref 0.1–1.0)
Monocytes Relative: 21 %
Neutro Abs: 1.4 10*3/uL — ABNORMAL LOW (ref 1.7–7.7)
Neutrophils Relative %: 40 %
Platelets: 128 10*3/uL — ABNORMAL LOW (ref 150–400)
RBC: 2.95 MIL/uL — ABNORMAL LOW (ref 4.22–5.81)
RDW: 15.2 % (ref 11.5–15.5)
WBC: 3.5 10*3/uL — ABNORMAL LOW (ref 4.0–10.5)
nRBC: 0 % (ref 0.0–0.2)

## 2022-05-12 LAB — COMPREHENSIVE METABOLIC PANEL
ALT: 14 U/L (ref 0–44)
AST: 15 U/L (ref 15–41)
Albumin: 3.2 g/dL — ABNORMAL LOW (ref 3.5–5.0)
Alkaline Phosphatase: 29 U/L — ABNORMAL LOW (ref 38–126)
Anion gap: 1 — ABNORMAL LOW (ref 5–15)
BUN: 13 mg/dL (ref 8–23)
CO2: 28 mmol/L (ref 22–32)
Calcium: 8.3 mg/dL — ABNORMAL LOW (ref 8.9–10.3)
Chloride: 110 mmol/L (ref 98–111)
Creatinine, Ser: 1.29 mg/dL — ABNORMAL HIGH (ref 0.61–1.24)
GFR, Estimated: 58 mL/min — ABNORMAL LOW (ref >60)
Glucose, Bld: 101 mg/dL — ABNORMAL HIGH (ref 70–99)
Potassium: 4.5 mmol/L (ref 3.5–5.1)
Sodium: 139 mmol/L (ref 135–145)
Total Bilirubin: 0.5 mg/dL (ref 0.3–1.2)
Total Protein: 5 g/dL — ABNORMAL LOW (ref 6.5–8.1)

## 2022-05-12 LAB — MAGNESIUM: Magnesium: 1.9 mg/dL (ref 1.7–2.4)

## 2022-05-12 MED ORDER — DARATUMUMAB-HYALURONIDASE-FIHJ 1800-30000 MG-UT/15ML ~~LOC~~ SOLN
1800.0000 mg | Freq: Once | SUBCUTANEOUS | Status: AC
  Administered 2022-05-12: 1800 mg via SUBCUTANEOUS
  Filled 2022-05-12: qty 15

## 2022-05-12 NOTE — Progress Notes (Signed)
Labs reviewed , ok to treat per MD. ANC 1.4 noted.  ? ?Treatment given per orders. Patient tolerated it well without problems. Vitals stable and discharged home from clinic ambulatory. Follow up as scheduled. ? ? ?

## 2022-05-12 NOTE — Progress Notes (Signed)
Patient takes premedication at home. ? ?Henreitta Leber, PharmD ?

## 2022-05-12 NOTE — Patient Instructions (Signed)
[facility]  Discharge Instructions: ?Thank you for choosing [facility] to provide your oncology and hematology care.  ?If you have a lab appointment with the [facility], please come in thru the Main Entrance and check in at the main information desk. ? ?Wear comfortable clothing and clothing appropriate for easy access to any Portacath or PICC line.  ? ?We strive to give you quality time with your provider. You may need to reschedule your appointment if you arrive late (15 or more minutes).  Arriving late affects you and other patients whose appointments are after yours.  Also, if you miss three or more appointments without notifying the office, you may be dismissed from the clinic at the provider?s discretion.    ?  ?For prescription refill requests, have your pharmacy contact our office and allow 72 hours for refills to be completed.   ? ?Today you received the following chemotherapy and/or immunotherapy agents SQ [facility]    ?  ?To help prevent nausea and vomiting after your treatment, we encourage you to take your nausea medication as directed. ? ?BELOW ARE SYMPTOMS THAT SHOULD BE REPORTED IMMEDIATELY: ?*FEVER GREATER THAN 100.4 F (38 ?C) OR HIGHER ?*CHILLS OR SWEATING ?*NAUSEA AND VOMITING THAT IS NOT CONTROLLED WITH YOUR NAUSEA MEDICATION ?*UNUSUAL SHORTNESS OF BREATH ?*UNUSUAL BRUISING OR BLEEDING ?*URINARY PROBLEMS (pain or burning when urinating, or frequent urination) ?*BOWEL PROBLEMS (unusual diarrhea, constipation, pain near the anus) ?TENDERNESS IN MOUTH AND THROAT WITH OR WITHOUT PRESENCE OF ULCERS (sore throat, sores in mouth, or a toothache) ?UNUSUAL RASH, SWELLING OR PAIN  ?UNUSUAL VAGINAL DISCHARGE OR ITCHING  ? ?Items with * indicate a potential emergency and should be followed up as soon as possible or go to the Emergency Department if any problems should occur. ? ?Please show the CHEMOTHERAPY ALERT CARD or IMMUNOTHERAPY ALERT CARD at check-in to the Emergency  Department and triage nurse. ? ?Should you have questions after your visit or need to cancel or reschedule your appointment, please contact [facility] [phone]  and follow the prompts.  Office hours are 8:00 a.m. to 4:30 p.m. Monday - Friday. Please note that voicemails left after 4:00 p.m. may not be returned until the following business day.  We are closed weekends and major holidays. You have access to a nurse at all times for urgent questions. Please call the main number to the clinic [phone] and follow the prompts. ? ?For any non-urgent questions, you may also contact your provider using MyChart. We now offer e-Visits for anyone 58 and older to request care online for non-urgent symptoms. For details visit [email or web address]. ?  ?Also download the MyChart app! Go to the app store, search "MyChart", open the app, select [facility], and log in with your MyChart username and password. ? ?Due to Covid, a mask is required upon entering the hospital/clinic. If you do not have a mask, one will be given to you upon arrival. For doctor visits, patients may have 1 support person aged 32 or older with them. For treatment visits, patients cannot have anyone with them due to current Covid guidelines and our immunocompromised population.  ?

## 2022-05-13 ENCOUNTER — Encounter (HOSPITAL_COMMUNITY): Payer: Self-pay

## 2022-05-13 ENCOUNTER — Inpatient Hospital Stay (HOSPITAL_COMMUNITY): Payer: Medicare Other

## 2022-05-13 VITALS — BP 144/64 | HR 65 | Temp 98.1°F | Resp 16

## 2022-05-13 DIAGNOSIS — Z79899 Other long term (current) drug therapy: Secondary | ICD-10-CM | POA: Diagnosis not present

## 2022-05-13 DIAGNOSIS — Z5112 Encounter for antineoplastic immunotherapy: Secondary | ICD-10-CM | POA: Diagnosis not present

## 2022-05-13 DIAGNOSIS — C7951 Secondary malignant neoplasm of bone: Secondary | ICD-10-CM

## 2022-05-13 DIAGNOSIS — C9000 Multiple myeloma not having achieved remission: Secondary | ICD-10-CM | POA: Diagnosis not present

## 2022-05-13 MED ORDER — DENOSUMAB 120 MG/1.7ML ~~LOC~~ SOLN
120.0000 mg | Freq: Once | SUBCUTANEOUS | Status: AC
  Administered 2022-05-13: 120 mg via SUBCUTANEOUS
  Filled 2022-05-13: qty 1.7

## 2022-05-13 NOTE — Progress Notes (Signed)
Patient taking calcium as directed.  Denied tooth, jaw, and leg pain.  No recent or upcoming dental visits.  Labs reviewed.  Patient tolerated injection with no complaints voiced.  See MAR for details.  Patient stable during and after injection.  Site clean and dry with no bruising or swelling noted.  Band aid applied.  Vss with discharge and left in satisfactory condition with no s/s of distress noted.   

## 2022-05-13 NOTE — Patient Instructions (Signed)
[facility]  Discharge Instructions: ?Thank you for choosing [facility] to provide your oncology and hematology care.  ?If you have a lab appointment with the [facility], please come in thru the Main Entrance and check in at the main information desk. ? ?Wear comfortable clothing and clothing appropriate for easy access to any Portacath or PICC line.  ? ?We strive to give you quality time with your provider. You may need to reschedule your appointment if you arrive late (15 or more minutes).  Arriving late affects you and other patients whose appointments are after yours.  Also, if you miss three or more appointments without notifying the office, you may be dismissed from the clinic at the provider?s discretion.    ?  ?For prescription refill requests, have your pharmacy contact our office and allow 72 hours for refills to be completed.   ? ?Today you received the following chemotherapy and/or immunotherapy agents xgeva.  ?Denosumab injection ?What is this medication? ?DENOSUMAB (den oh sue mab) slows bone breakdown. Prolia is used to treat osteoporosis in women after menopause and in men, and in people who are taking corticosteroids for 6 months or more. Delton See is used to treat a high calcium level due to cancer and to prevent bone fractures and other bone problems caused by multiple myeloma or cancer bone metastases. Delton See is also used to treat giant cell tumor of the bone. ?This medicine may be used for other purposes; ask your health care provider or pharmacist if you have questions. ?COMMON BRAND NAME(S): Prolia, XGEVA ?What should I tell my care team before I take this medication? ?They need to know if you have any of these conditions: ?dental disease ?having surgery or tooth extraction ?infection ?kidney disease ?low levels of calcium or Vitamin D in the blood ?malnutrition ?on hemodialysis ?skin conditions or sensitivity ?thyroid or parathyroid disease ?an unusual reaction to  denosumab, other medicines, foods, dyes, or preservatives ?pregnant or trying to get pregnant ?breast-feeding ?How should I use this medication? ?This medicine is for injection under the skin. It is given by a health care professional in a hospital or clinic setting. ?A special MedGuide will be given to you before each treatment. Be sure to read this information carefully each time. ?For Prolia, talk to your pediatrician regarding the use of this medicine in children. Special care may be needed. For Delton See, talk to your pediatrician regarding the use of this medicine in children. While this drug may be prescribed for children as young as 13 years for selected conditions, precautions do apply. ?Overdosage: If you think you have taken too much of this medicine contact a poison control center or emergency room at once. ?NOTE: This medicine is only for you. Do not share this medicine with others. ?What if I miss a dose? ?It is important not to miss your dose. Call your doctor or health care professional if you are unable to keep an appointment. ?What may interact with this medication? ?Do not take this medicine with any of the following medications: ?other medicines containing denosumab ?This medicine may also interact with the following medications: ?medicines that lower your chance of fighting infection ?steroid medicines like prednisone or cortisone ?This list may not describe all possible interactions. Give your health care provider a list of all the medicines, herbs, non-prescription drugs, or dietary supplements you use. Also tell them if you smoke, drink alcohol, or use illegal drugs. Some items may interact with your medicine. ?What should  I watch for while using this medication? ?Visit your doctor or health care professional for regular checks on your progress. Your doctor or health care professional may order blood tests and other tests to see how you are doing. ?Call your doctor or health care professional for  advice if you get a fever, chills or sore throat, or other symptoms of a cold or flu. Do not treat yourself. This drug may decrease your body's ability to fight infection. Try to avoid being around people who are sick. ?You should make sure you get enough calcium and vitamin D while you are taking this medicine, unless your doctor tells you not to. Discuss the foods you eat and the vitamins you take with your health care professional. ?See your dentist regularly. Brush and floss your teeth as directed. Before you have any dental work done, tell your dentist you are receiving this medicine. ?Do not become pregnant while taking this medicine or for 5 months after stopping it. Talk with your doctor or health care professional about your birth control options while taking this medicine. Women should inform their doctor if they wish to become pregnant or think they might be pregnant. There is a potential for serious side effects to an unborn child. Talk to your health care professional or pharmacist for more information. ?What side effects may I notice from receiving this medication? ?Side effects that you should report to your doctor or health care professional as soon as possible: ?allergic reactions like skin rash, itching or hives, swelling of the face, lips, or tongue ?bone pain ?breathing problems ?dizziness ?jaw pain, especially after dental work ?redness, blistering, peeling of the skin ?signs and symptoms of infection like fever or chills; cough; sore throat; pain or trouble passing urine ?signs of low calcium like fast heartbeat, muscle cramps or muscle pain; pain, tingling, numbness in the hands or feet; seizures ?unusual bleeding or bruising ?unusually weak or tired ?Side effects that usually do not require medical attention (report to your doctor or health care professional if they continue or are bothersome): ?constipation ?diarrhea ?headache ?joint pain ?loss of appetite ?muscle pain ?runny  nose ?tiredness ?upset stomach ?This list may not describe all possible side effects. Call your doctor for medical advice about side effects. You may report side effects to FDA at 1-800-FDA-1088. ?Where should I keep my medication? ?This medicine is only given in a clinic, doctor's office, or other health care setting and will not be stored at home. ?NOTE: This sheet is a summary. It may not cover all possible information. If you have questions about this medicine, talk to your doctor, pharmacist, or health care provider. ?? 2023 Elsevier/Gold Standard (2018-04-22 00:00:00) ?    ?  ?To help prevent nausea and vomiting after your treatment, we encourage you to take your nausea medication as directed. ? ?BELOW ARE SYMPTOMS THAT SHOULD BE REPORTED IMMEDIATELY: ?*FEVER GREATER THAN 100.4 F (38 ?C) OR HIGHER ?*CHILLS OR SWEATING ?*NAUSEA AND VOMITING THAT IS NOT CONTROLLED WITH YOUR NAUSEA MEDICATION ?*UNUSUAL SHORTNESS OF BREATH ?*UNUSUAL BRUISING OR BLEEDING ?*URINARY PROBLEMS (pain or burning when urinating, or frequent urination) ?*BOWEL PROBLEMS (unusual diarrhea, constipation, pain near the anus) ?TENDERNESS IN MOUTH AND THROAT WITH OR WITHOUT PRESENCE OF ULCERS (sore throat, sores in mouth, or a toothache) ?UNUSUAL RASH, SWELLING OR PAIN  ?UNUSUAL VAGINAL DISCHARGE OR ITCHING  ? ?Items with * indicate a potential emergency and should be followed up as soon as possible or go to the Emergency Department if any  problems should occur. ? ?Please show the CHEMOTHERAPY ALERT CARD or IMMUNOTHERAPY ALERT CARD at check-in to the Emergency Department and triage nurse. ? ?Should you have questions after your visit or need to cancel or reschedule your appointment, please contact [facility] [phone]  and follow the prompts.  Office hours are 8:00 a.m. to 4:30 p.m. Monday - Friday. Please note that voicemails left after 4:00 p.m. may not be returned until the following business day.  We are closed weekends and  major holidays. You have access to a nurse at all times for urgent questions. Please call the main number to the clinic [phone] and follow the prompts. ? ?For any non-urgent questions, you may also contact your

## 2022-05-15 ENCOUNTER — Other Ambulatory Visit (HOSPITAL_COMMUNITY): Payer: Self-pay

## 2022-05-15 DIAGNOSIS — G4700 Insomnia, unspecified: Secondary | ICD-10-CM

## 2022-05-15 DIAGNOSIS — I10 Essential (primary) hypertension: Secondary | ICD-10-CM

## 2022-05-15 MED ORDER — POMALIDOMIDE 2 MG PO CAPS: ORAL_CAPSULE | ORAL | 0 refills | Status: DC

## 2022-05-15 NOTE — Telephone Encounter (Signed)
Chart reviewed. Pomalyst refilled per last office note with Dr. Katragadda.  

## 2022-05-31 ENCOUNTER — Other Ambulatory Visit (HOSPITAL_COMMUNITY): Payer: Self-pay | Admitting: Hematology

## 2022-06-01 ENCOUNTER — Encounter (HOSPITAL_COMMUNITY): Payer: Self-pay | Admitting: Hematology

## 2022-06-02 ENCOUNTER — Inpatient Hospital Stay (HOSPITAL_COMMUNITY): Payer: Medicare Other | Attending: Hematology

## 2022-06-02 DIAGNOSIS — Z79899 Other long term (current) drug therapy: Secondary | ICD-10-CM | POA: Insufficient documentation

## 2022-06-02 DIAGNOSIS — R059 Cough, unspecified: Secondary | ICD-10-CM | POA: Diagnosis not present

## 2022-06-02 DIAGNOSIS — G629 Polyneuropathy, unspecified: Secondary | ICD-10-CM | POA: Insufficient documentation

## 2022-06-02 DIAGNOSIS — C9002 Multiple myeloma in relapse: Secondary | ICD-10-CM | POA: Diagnosis not present

## 2022-06-02 DIAGNOSIS — C9000 Multiple myeloma not having achieved remission: Secondary | ICD-10-CM

## 2022-06-02 DIAGNOSIS — Z7961 Long term (current) use of immunomodulator: Secondary | ICD-10-CM | POA: Insufficient documentation

## 2022-06-02 DIAGNOSIS — I10 Essential (primary) hypertension: Secondary | ICD-10-CM | POA: Insufficient documentation

## 2022-06-02 DIAGNOSIS — Z801 Family history of malignant neoplasm of trachea, bronchus and lung: Secondary | ICD-10-CM | POA: Diagnosis not present

## 2022-06-02 DIAGNOSIS — R197 Diarrhea, unspecified: Secondary | ICD-10-CM | POA: Insufficient documentation

## 2022-06-02 DIAGNOSIS — M7989 Other specified soft tissue disorders: Secondary | ICD-10-CM | POA: Diagnosis not present

## 2022-06-02 DIAGNOSIS — D801 Nonfamilial hypogammaglobulinemia: Secondary | ICD-10-CM | POA: Diagnosis not present

## 2022-06-02 DIAGNOSIS — Z5112 Encounter for antineoplastic immunotherapy: Secondary | ICD-10-CM | POA: Insufficient documentation

## 2022-06-02 DIAGNOSIS — Z853 Personal history of malignant neoplasm of breast: Secondary | ICD-10-CM | POA: Diagnosis not present

## 2022-06-02 DIAGNOSIS — D649 Anemia, unspecified: Secondary | ICD-10-CM

## 2022-06-02 LAB — COMPREHENSIVE METABOLIC PANEL
ALT: 16 U/L (ref 0–44)
AST: 17 U/L (ref 15–41)
Albumin: 3.2 g/dL — ABNORMAL LOW (ref 3.5–5.0)
Alkaline Phosphatase: 27 U/L — ABNORMAL LOW (ref 38–126)
Anion gap: 8 (ref 5–15)
BUN: 15 mg/dL (ref 8–23)
CO2: 25 mmol/L (ref 22–32)
Calcium: 8.3 mg/dL — ABNORMAL LOW (ref 8.9–10.3)
Chloride: 106 mmol/L (ref 98–111)
Creatinine, Ser: 1.36 mg/dL — ABNORMAL HIGH (ref 0.61–1.24)
GFR, Estimated: 55 mL/min — ABNORMAL LOW (ref >60)
Glucose, Bld: 102 mg/dL — ABNORMAL HIGH (ref 70–99)
Potassium: 4.4 mmol/L (ref 3.5–5.1)
Sodium: 139 mmol/L (ref 135–145)
Total Bilirubin: 0.5 mg/dL (ref 0.3–1.2)
Total Protein: 5.3 g/dL — ABNORMAL LOW (ref 6.5–8.1)

## 2022-06-02 LAB — CBC WITH DIFFERENTIAL/PLATELET
Abs Immature Granulocytes: 0.01 10*3/uL (ref 0.00–0.07)
Basophils Absolute: 0.1 10*3/uL (ref 0.0–0.1)
Basophils Relative: 1 %
Eosinophils Absolute: 0.2 10*3/uL (ref 0.0–0.5)
Eosinophils Relative: 5 %
HCT: 32.9 % — ABNORMAL LOW (ref 39.0–52.0)
Hemoglobin: 10.8 g/dL — ABNORMAL LOW (ref 13.0–17.0)
Immature Granulocytes: 0 %
Lymphocytes Relative: 17 %
Lymphs Abs: 0.7 10*3/uL (ref 0.7–4.0)
MCH: 35.8 pg — ABNORMAL HIGH (ref 26.0–34.0)
MCHC: 32.8 g/dL (ref 30.0–36.0)
MCV: 108.9 fL — ABNORMAL HIGH (ref 80.0–100.0)
Monocytes Absolute: 0.3 10*3/uL (ref 0.1–1.0)
Monocytes Relative: 6 %
Neutro Abs: 3 10*3/uL (ref 1.7–7.7)
Neutrophils Relative %: 71 %
Platelets: 156 10*3/uL (ref 150–400)
RBC: 3.02 MIL/uL — ABNORMAL LOW (ref 4.22–5.81)
RDW: 15.9 % — ABNORMAL HIGH (ref 11.5–15.5)
WBC: 4.3 10*3/uL (ref 4.0–10.5)
nRBC: 0 % (ref 0.0–0.2)

## 2022-06-02 LAB — MAGNESIUM: Magnesium: 2 mg/dL (ref 1.7–2.4)

## 2022-06-03 LAB — KAPPA/LAMBDA LIGHT CHAINS
Kappa free light chain: 1.5 mg/L — ABNORMAL LOW (ref 3.3–19.4)
Kappa, lambda light chain ratio: 1 (ref 0.26–1.65)
Lambda free light chains: 1.5 mg/L — ABNORMAL LOW (ref 5.7–26.3)

## 2022-06-04 DIAGNOSIS — H01001 Unspecified blepharitis right upper eyelid: Secondary | ICD-10-CM | POA: Diagnosis not present

## 2022-06-04 DIAGNOSIS — H26493 Other secondary cataract, bilateral: Secondary | ICD-10-CM | POA: Diagnosis not present

## 2022-06-04 DIAGNOSIS — H01002 Unspecified blepharitis right lower eyelid: Secondary | ICD-10-CM | POA: Diagnosis not present

## 2022-06-04 DIAGNOSIS — H35713 Central serous chorioretinopathy, bilateral: Secondary | ICD-10-CM | POA: Diagnosis not present

## 2022-06-04 LAB — PROTEIN ELECTROPHORESIS, SERUM
A/G Ratio: 2 — ABNORMAL HIGH (ref 0.7–1.7)
Albumin ELP: 3 g/dL (ref 2.9–4.4)
Alpha-1-Globulin: 0.2 g/dL (ref 0.0–0.4)
Alpha-2-Globulin: 0.5 g/dL (ref 0.4–1.0)
Beta Globulin: 0.6 g/dL — ABNORMAL LOW (ref 0.7–1.3)
Gamma Globulin: 0.1 g/dL — ABNORMAL LOW (ref 0.4–1.8)
Globulin, Total: 1.5 g/dL — ABNORMAL LOW (ref 2.2–3.9)
M-Spike, %: 0.1 g/dL — ABNORMAL HIGH
Total Protein ELP: 4.5 g/dL — ABNORMAL LOW (ref 6.0–8.5)

## 2022-06-08 LAB — IMMUNOFIXATION ELECTROPHORESIS
IgA: <5 mg/dL — ABNORMAL LOW (ref 61–437)
IgG (Immunoglobin G), Serum: 147 mg/dL — ABNORMAL LOW (ref 603–1613)
IgM (Immunoglobulin M), Srm: <5 mg/dL — ABNORMAL LOW (ref 15–143)
Total Protein ELP: 5.1 g/dL — ABNORMAL LOW (ref 6.0–8.5)

## 2022-06-09 ENCOUNTER — Inpatient Hospital Stay (HOSPITAL_BASED_OUTPATIENT_CLINIC_OR_DEPARTMENT_OTHER): Payer: Medicare Other | Admitting: Hematology

## 2022-06-09 ENCOUNTER — Encounter (HOSPITAL_COMMUNITY): Payer: Self-pay | Admitting: Hematology

## 2022-06-09 ENCOUNTER — Inpatient Hospital Stay (HOSPITAL_COMMUNITY): Payer: Medicare Other

## 2022-06-09 VITALS — BP 143/78 | HR 59 | Temp 98.9°F | Resp 18 | Wt 213.3 lb

## 2022-06-09 DIAGNOSIS — G629 Polyneuropathy, unspecified: Secondary | ICD-10-CM | POA: Diagnosis not present

## 2022-06-09 DIAGNOSIS — C9002 Multiple myeloma in relapse: Secondary | ICD-10-CM | POA: Diagnosis not present

## 2022-06-09 DIAGNOSIS — Z5112 Encounter for antineoplastic immunotherapy: Secondary | ICD-10-CM | POA: Diagnosis not present

## 2022-06-09 DIAGNOSIS — C9000 Multiple myeloma not having achieved remission: Secondary | ICD-10-CM

## 2022-06-09 DIAGNOSIS — M7989 Other specified soft tissue disorders: Secondary | ICD-10-CM | POA: Diagnosis not present

## 2022-06-09 DIAGNOSIS — R059 Cough, unspecified: Secondary | ICD-10-CM | POA: Diagnosis not present

## 2022-06-09 DIAGNOSIS — I10 Essential (primary) hypertension: Secondary | ICD-10-CM | POA: Diagnosis not present

## 2022-06-09 MED ORDER — DARATUMUMAB-HYALURONIDASE-FIHJ 1800-30000 MG-UT/15ML ~~LOC~~ SOLN
1800.0000 mg | Freq: Once | SUBCUTANEOUS | Status: AC
  Administered 2022-06-09: 1800 mg via SUBCUTANEOUS
  Filled 2022-06-09: qty 15

## 2022-06-09 NOTE — Progress Notes (Signed)
Patient is taking Pomalyst as prescribed.  He has not missed any doses and reports no side effects at this time.   

## 2022-06-09 NOTE — Progress Notes (Signed)
Patient has been examined by Dr. Katragadda, and vital signs and labs have been reviewed. ANC, Creatinine, LFTs, hemoglobin, and platelets are within treatment parameters per M.D. - pt may proceed with treatment.    °

## 2022-06-09 NOTE — Progress Notes (Signed)
Patient presents today for Daratumumab injection per providers order.  Vital signs and labs within parameters for treatment.  Patient took his premedications at home.  Message received from Anastasio Champion RN/Dr. Delton Coombes patient okay for treatment.   Daratumumab administration without incident; injection site WNL; see MAR for injection details.  Patient tolerated procedure well and without incident.  No questions or complaints noted at this time. Discharge from clinic ambulatory in stable condition.  Alert and oriented X 3.  Follow up with [facility] as scheduled.

## 2022-06-09 NOTE — Patient Instructions (Signed)
[facility]  Discharge Instructions: Thank you for choosing [facility] to provide your oncology and hematology care.  If you have a lab appointment with the Cancer Center, please come in thru the Main Entrance and check in at the main information desk.  Wear comfortable clothing and clothing appropriate for easy access to any Portacath or PICC line.   We strive to give you quality time with your provider. You may need to reschedule your appointment if you arrive late (15 or more minutes).  Arriving late affects you and other patients whose appointments are after yours.  Also, if you miss three or more appointments without notifying the office, you may be dismissed from the clinic at the provider's discretion.      For prescription refill requests, have your pharmacy contact our office and allow 72 hours for refills to be completed.    Today you received the following chemotherapy and/or immunotherapy agents Daratumumab      To help prevent nausea and vomiting after your treatment, we encourage you to take your nausea medication as directed.  BELOW ARE SYMPTOMS THAT SHOULD BE REPORTED IMMEDIATELY: *FEVER GREATER THAN 100.4 F (38 C) OR HIGHER *CHILLS OR SWEATING *NAUSEA AND VOMITING THAT IS NOT CONTROLLED WITH YOUR NAUSEA MEDICATION *UNUSUAL SHORTNESS OF BREATH *UNUSUAL BRUISING OR BLEEDING *URINARY PROBLEMS (pain or burning when urinating, or frequent urination) *BOWEL PROBLEMS (unusual diarrhea, constipation, pain near the anus) TENDERNESS IN MOUTH AND THROAT WITH OR WITHOUT PRESENCE OF ULCERS (sore throat, sores in mouth, or a toothache) UNUSUAL RASH, SWELLING OR PAIN  UNUSUAL VAGINAL DISCHARGE OR ITCHING   Items with * indicate a potential emergency and should be followed up as soon as possible or go to the Emergency Department if any problems should occur.  Please show the CHEMOTHERAPY ALERT CARD or IMMUNOTHERAPY ALERT CARD at check-in to the Emergency  Department and triage nurse.  Should you have questions after your visit or need to cancel or reschedule your appointment, please contact [facility] [phone]  and follow the prompts.  Office hours are 8:00 a.m. to 4:30 p.m. Monday - Friday. Please note that voicemails left after 4:00 p.m. may not be returned until the following business day.  We are closed weekends and major holidays. You have access to a nurse at all times for urgent questions. Please call the main number to the clinic [phone] and follow the prompts.  For any non-urgent questions, you may also contact your provider using MyChart. We now offer e-Visits for anyone 18 and older to request care online for non-urgent symptoms. For details visit [email or web address].   Also download the MyChart app! Go to the app store, search "MyChart", open the app, select [facility], and log in with your MyChart username and password.  Masks are optional in the cancer centers. If you would like for your care team to wear a mask while they are taking care of you, please let them know. For doctor visits, patients may have with them one support person who is at least 74 years old. At this time, visitors are not allowed in the infusion area.  

## 2022-06-09 NOTE — Progress Notes (Signed)
Johnny Lucas, [place] [address]   CLINIC:  Medical Oncology/Hematology  PCP:  Johnny Percy, MD [address] [place] [address] [phone]   REASON FOR VISIT:  Follow-up for multiple myeloma  PRIOR THERAPY:  1. RVD x 6 cycles from 03/01/2017 to 06/29/2017. 2. Stem cell transplant on 09/30/2017. 3. Carfilzomib and cyclophosphamide x 2 cycles from 02/14/2018 to 03/29/2018. 4. Carfilzomib x 22 cycles from 04/20/2018 to 01/23/2020.  NGS Results: not done  CURRENT THERAPY: Darzalex Faspro monthly; Pomalyst 2 mg 3/4 weeks; Xgeva monthly  BRIEF ONCOLOGIC HISTORY:  Oncology History  Breast Lucas, male (Johnny Lucas)  12/31/2009 Initial Biopsy   Biopsy of L breast    12/31/2009 Pathology Results   Invasive ductal carcinoma, ER/PR+, HER 2 negative   12/31/2009 Imaging   Ultrasound showing a 2.43 x 1.85 x 3 cm hypoechoic spiculated mass in the 12 o clock L breast retroareolar region   01/01/2010 -  Anti-estrogen oral therapy   Johnny Lucas 20 mg daily   01/06/2010 Imaging   Bone scan abnormal uptake in the diaphysis of the R humerus, abnormal in the R third, fifth and sixth ribs, lesion also noted in the sternum.   02/03/2010 Surgery   Rod placement and fixation of R humerus by Dr. Amedeo Lucas   02/05/2010 - 02/18/2010 Radiation Therapy   30Gy in 10 fractions of 3 Gy per fraction to R pathologic fracture   03/11/2010 -  Chemotherapy   Denosumab monthly, now every 3 months. Started at [facility]    06/09/2016 Imaging   Three hypermetabolic osseous lesions in the sternum, left ilium and right ilium, as discussed above, likely represent osseous metastases. At this time, these are not recognizable on the CT images. 2. No extra skeletal metastatic disease identified in the neck, chest, abdomen or pelvis.   10/13/2016 Progression   PET shows various new and enlarging osseous metastatic lesions with no definite extra osseous metastatic disease currently identified.     12/31/2016 Progression   1. Multifocal hypermetabolic osseous metastases throughout the axial and proximal appendicular skeleton, which are increased in size, number and metabolism since 10/13/2016 PET-CT. 2. 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 3. No additional sites of hypermetabolic metastatic disease. 4. Chronic right mastoid sinusitis. 5. Aortic atherosclerosis.  One vessel coronary atherosclerosis.   Multiple myeloma ([place])  02/12/2017 Bone Marrow Biopsy   The marrow was variably cellular with large peritrabecular aggregates of kappa restricted plasma cells (66% by aspirate, 30% by Cd138). Cytogenetics +11.    03/01/2017 - 06/29/2017 Chemotherapy   RVD    05/26/2017 Bone Marrow Biopsy   Performed at [facility]:  Plasma cell myeloma in a 30% cellular marrow with decreased trilineage hematopoiesis and 42% kappa light chain restricted plasma cells on the aspirate smears and large aggregates on the core biopsy.     07/12/2017 - 09/01/2017 Chemotherapy   3 cycles of carfizolmib/cyclophosphamide/dexamethasone     10/08/2017 Bone Marrow Transplant   Autotransplant at [facility]   02/28/2020 -  Chemotherapy   Patient is on Treatment Plan : Daratumumab q28d     Multiple myeloma not having achieved remission ([employer])  06/29/2017 Initial Diagnosis   Multiple myeloma not having achieved remission ([place])   02/28/2020 -  Chemotherapy   Patient is on Treatment Plan : Daratumumab q28d       Lucas STAGING:  Lucas Staging  Multiple myeloma ([place]) Staging form: Plasma Cell Myeloma and Plasma Cell Disorders, AJCC 8th Edition - Clinical stage from 05/03/2017: Beta-2-microglobulin (mg/L): 3.5, Albumin (g/dL): 3.4, ISS: Stage II, High-risk cytogenetics: Absent, LDH: Not assessed - Signed by Johnny Cancer, PA-C on 05/03/2017   INTERVAL HISTORY:  [name], a 76 y.o. male, returns for routine follow-up and consideration for next cycle of chemotherapy. Johnny Lucas was last seen on 03/17/2022.  Due for cycle #37 of Darzalex today.   Overall, he tells me he has been feeling pretty well. He reports a feeling of swelling and tightness in his legs from his knees down bilaterally. He is taking Gabapentin which has not improved his neuropathy for the past 3-4 months. He denies infections in the past 3 months. He is not currently taking any iron tablets. He reports cough in the mornings.   Overall, he feels ready for next cycle of chemo today.    REVIEW OF SYSTEMS:  Review of Systems  Constitutional:  Negative for appetite change and fatigue.  Respiratory:  Positive for cough.   Cardiovascular:  Positive for leg swelling.  Skin:  Positive for rash.  Neurological:  Positive for numbness.  Hematological:  Bruises/bleeds easily.  All other systems reviewed and are negative.   PAST MEDICAL/SURGICAL HISTORY:  Past Medical History:  Diagnosis Date   Anxiety    Bone metastases 09/10/2016   Breast Lucas ([place]) 2011   Stage IV breast Lucas; radiation and Johnny Lucas   Breast Lucas, male ([place])    Stage IV breast Lucas; radiation and Johnny Lucas  Overview:  Left breast ca with mets bone  Overview:  METS TO BONE   GERD (gastroesophageal reflux disease)    Hypertension    Macular degeneration    Multiple myeloma ([place]) 02/18/2017   Peripheral neuropathy    Past Surgical History:  Procedure Laterality Date   BACK SURGERY     C2-C3 fusion   BREAST BIOPSY Left    Lucas   CATARACT EXTRACTION W/PHACO Left 02/03/2019   Procedure: CATARACT EXTRACTION PHACO AND INTRAOCULAR LENS PLACEMENT ([place]);  Surgeon: Johnny Goldmann, MD;  Location: [facility];  Service: Ophthalmology;  Laterality: Left;  CDE: 15.89   CATARACT EXTRACTION W/PHACO Right 07/25/2021   Procedure: CATARACT EXTRACTION PHACO AND INTRAOCULAR LENS PLACEMENT ([facility]);  Surgeon: Johnny Goldmann, MD;   Location: [facility];  Service: Ophthalmology;  Laterality: Right;  CDE   26.71   HERNIA REPAIR     PORTACATH PLACEMENT Left 06/10/2018   Procedure: INSERTION PORT-A-CATH;  Surgeon: Johnny Cagey, MD;  Location: [facility];  Service: General;  Laterality: Left;   right arm surgery     bone Lucas and rod placed    SOCIAL HISTORY:  Social History   Socioeconomic History   Marital status: Married    Spouse name: Not on file   Number of children: 3   Years of education: Not on file   Highest education level: Not on file  Occupational History   Not on file  Tobacco Use   Smoking status: Never   Smokeless tobacco: Former  Scientific laboratory technician Use: Never used  Substance and Sexual Activity   Alcohol use: Yes    Alcohol/week: 24.0 standard drinks of alcohol    Types: 24 Cans of beer per week   Drug use: No   Sexual activity: Yes  Other Topics Concern   Not on file  Social History Narrative   Not on file  Social Determinants of Health   Financial Resource Strain: Low Risk  (11/26/2020)   Overall Financial Resource Strain (CARDIA)    Difficulty of Paying Living Expenses: Not hard at all  Food Insecurity: No Food Insecurity (11/26/2020)   Hunger Vital Sign    Worried About Running Out of Food in the Last Year: Never true    Ran Out of Food in the Last Year: Never true  Transportation Needs: No Transportation Needs (11/26/2020)   PRAPARE - Hydrologist (Medical): No    Lack of Transportation (Non-Medical): No  Physical Activity: Inactive (11/26/2020)   Exercise Vital Sign    Days of Exercise per Week: 0 days    Minutes of Exercise per Session: 0 min  Stress: Stress Concern Present (11/26/2020)   [place]    Feeling of Stress : To some extent  Social Connections: Moderately Integrated (11/26/2020)   Social Connection and Isolation Panel [NHANES]    Frequency of Communication with  Friends and Family: More than three times a week    Frequency of Social Gatherings with Friends and Family: Three times a week    Attends Religious Services: 1 to 4 times per year    Active Member of Clubs or Organizations: No    Attends Archivist Meetings: Never    Marital Status: Married  Human resources officer Violence: Not At Risk (11/26/2020)   Humiliation, Afraid, Rape, and Kick questionnaire    Fear of Current or Ex-Partner: No    Emotionally Abused: No    Physically Abused: No    Sexually Abused: No    FAMILY HISTORY:  Family History  Problem Relation Age of Onset   Stroke Mother    Lucas Maternal Aunt        Lucas NOS; died in her 65s   Lung Lucas Maternal Uncle        smoker    CURRENT MEDICATIONS:  Current Outpatient Medications  Medication Sig Dispense Refill   acetaminophen (TYLENOL) 500 MG tablet Take 1,000 mg by mouth every 6 (six) hours as needed for moderate pain.     acyclovir (ZOVIRAX) 800 MG tablet TAKE 1 TABLET TWICE DAILY 180 tablet 3   albuterol (VENTOLIN HFA) 108 (90 Base) MCG/ACT inhaler Inhale 2 puffs into the lungs every 6 (six) hours as needed for wheezing or shortness of breath.     ALPRAZolam (XANAX) 0.5 MG tablet TAKE 1 TABLET BY MOUTH AT BEDTIME AS NEEDED FOR anxiety / SLEEP 30 tablet 5   aspirin EC 81 MG tablet Take 81 mg by mouth daily.      denosumab (XGEVA) 120 MG/1.7ML SOLN injection Inject 120 mg into the skin every 30 (thirty) days.      dexamethasone (DECADRON) 4 MG tablet TAKE FIVE TABLETS BY MOUTH WEEKLY 40 tablet 5   diphenhydrAMINE (BENADRYL) 25 MG tablet Take 25 mg by mouth daily as needed for allergies.     folic acid (FOLVITE) 1 MG tablet TAKE 1 TABLET BY MOUTH DAILY 90 tablet 2   gabapentin (NEURONTIN) 300 MG capsule TAKE ONE CAPSULE BY MOUTH THREE TIMES DAILY 180 capsule 2   Loperamide HCl (IMODIUM PO) Take by mouth as needed.     loratadine (CLARITIN) 10 MG tablet Take 1 tablet (10 mg total) by mouth daily. 90 tablet 6    metoprolol succinate (TOPROL-XL) 100 MG 24 hr tablet Take 100 mg by mouth daily.     Multiple  Vitamin (MULTIVITAMIN WITH MINERALS) TABS tablet Take 1 tablet by mouth daily.      ondansetron (ZOFRAN) 8 MG tablet TAKE 1 TABLET BY MOUTH EVERY 8 HOURS AS NEEDED FOR NAUSEA AND VOMITING 30 tablet 2   pantoprazole (PROTONIX) 40 MG tablet Take 40 mg by mouth every other day.      polyethylene glycol (MIRALAX / GLYCOLAX) packet Take 17 g by mouth daily as needed for mild constipation. 14 each 0   pomalidomide (POMALYST) 2 MG capsule TAKE 1 CAPSULE BY MOUTH EVERY DAY FOR 21 DAYS ON FOLLOWED BY 7 DAYS OFF 21 capsule 0   Johnny Lucas (NOLVADEX) 20 MG tablet TAKE 1 TABLET EVERY DAY 90 tablet 3   No current facility-administered medications for this visit.   Facility-Administered Medications Ordered in Other Visits  Medication Dose Route Frequency Provider Last Rate Last Admin   ondansetron (ZOFRAN) 8 mg in sodium chloride 0.9 % 50 mL IVPB  8 mg Intravenous Once Lockamy, Randi L, NP-C       sodium chloride flush (NS) 0.9 % injection 10 mL  10 mL Intracatheter Once PRN Derek Jack, MD        ALLERGIES:  Allergies  Allergen Reactions   Cefepime     Suspected severe thrombocytopenia is a result of cefepime induced antigen platelet destruction    PHYSICAL EXAM:  Performance status (ECOG): 1 - Symptomatic but completely ambulatory  There were no vitals filed for this visit. Wt Readings from Last 3 Encounters:  05/12/22 220 lb (99.8 kg)  04/14/22 217 lb (98.4 kg)  03/18/22 222 lb (100.7 kg)   Physical Exam Vitals reviewed.  Constitutional:      Appearance: Normal appearance. He is obese.  Cardiovascular:     Rate and Rhythm: Normal rate and regular rhythm.     Pulses: Normal pulses.     Heart sounds: Normal heart sounds.  Pulmonary:     Effort: Pulmonary effort is normal.     Breath sounds: Normal breath sounds.  Musculoskeletal:     Right lower leg: No edema.     Left lower leg: No  edema.  Neurological:     General: No focal deficit present.     Mental Status: He is alert and oriented to person, place, and time.  Psychiatric:        Mood and Affect: Mood normal.        Behavior: Behavior normal.     LABORATORY DATA:  I have reviewed the labs as listed.     Latest Ref Rng & Units 06/02/2022   10:33 AM 05/12/2022   10:03 AM 04/14/2022   10:48 AM  CBC  WBC 4.0 - 10.5 K/uL 4.3  3.5  3.8   Hemoglobin 13.0 - 17.0 g/dL 10.8  10.5  11.0   Hematocrit 39.0 - 52.0 % 32.9  31.7  32.9   Platelets 150 - 400 K/uL 156  128  138       Latest Ref Rng & Units 06/02/2022   10:33 AM 05/12/2022   10:03 AM 04/14/2022   10:48 AM  CMP  Glucose 70 - 99 mg/dL 102  101  97   BUN 8 - 23 mg/dL _0 Creatinine 0.61 - 1.24 mg/dL 1.36  1.29  1.33   Sodium 135 - 145 mmol/L 139  139  140   Potassium 3.5 - 5.1 mmol/L 4.4  4.5  4.2   Chloride 98 - 111 mmol/L 106  110  108   CO2 22 - 32 mmol/L _0 Calcium 8.9 - 10.3 mg/dL 8.3  8.3  9.0   Total Protein 6.5 - 8.1 g/dL 5.3  5.0  5.2   Total Bilirubin 0.3 - 1.2 mg/dL 0.5  0.5  0.5   Alkaline Phos 38 - 126 U/L _1 AST 15 - 41 U/L _2 ALT 0 - 44 U/L _3 DIAGNOSTIC IMAGING:  I have independently reviewed the scans and discussed with the patient. No results found.   ASSESSMENT:  1.  Relapsed multiple myeloma: -Autologous stem cell transplant on 10/08/2017 -Post transplant consolidation with CyCarD for 2 cycles with M spike undetectable. -Maintenance therapy with carfilzomib 70 mg per metered square days 1 and 15 every 28 days from 04/20/2018, dose reduced to 35 mg per metered square on 01/05/2019, titrated up to 56 mg per metered square on 01/09/2020. 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 -PET scan on 02/18/2020 showed new left supraclavicular lymph node at the thoracic inlet, SUV 14.2.  Multifocal osseous lesions largely improved, though residual  lesions noted including right acromion, left acromion, thyroid cartilage, right sternum.  Additional lesions throughout the sternum, bilateral ribs, thoracolumbar spine, bilateral pelvis have resolved. -BMBX on 02/14/2020 shows plasma cell myeloma, 60-70% of cells.  FISH for myeloma was negative.  Cytogenetics was normal. -4 cycles of daratumumab, bortezomib and dexamethasone from 02/28/2020 through 04/29/2020. -Daratumumab, pomalidomide and dexamethasone started on 05/28/2020.  He is receiving daratumumab every 2 weeks, Pomalyst 3 mg days 1-21, dexamethasone 20 mg weekly. -Pomalidomide dose reduced to 2 mg days 1-21 around the first week of July 2021, due to severe weakness.   PLAN:  1.  Relapsed IgG kappa multiple myeloma: - He is tolerating pomalidomide and monthly Darzalex very well. - I have reviewed myeloma panel from 06/02/2022 which showed M spike 0.1 g.  Creatinine is 1.36 and stable.  Other LFTs are stable.  White count and platelet count is normal.  Free light chain ratio is normal at 1.0 and immunofixation shows IgG kappa. - Recommend continuing pomalidomide 2 mg 3 weeks on/1 week off.  Continue Darzalex every 4 weeks.  He will take dexamethasone 20 mg only on days of Darzalex. - RTC 3 months with repeat myeloma labs.   2.  Myeloma bone disease: - Continue denosumab monthly.   3.  Macrocytic anemia: - Hemoglobin is 10.8 and MCV is 108.  Will check ferritin, iron panel, E23 and folic acid.   4.  ID prophylaxis: - Continue acyclovir twice daily.   5.  Peripheral neuropathy: - He reports soreness in his legs and also feeling tight.  Today examination did not reveal any swellings.  He is currently taking gabapentin 300 mg 3 times daily. - Tightness in the legs along with tenderness is likely manifestation of worsening neuropathy.  I have recommended increasing gabapentin to 600 mg 3 times daily and see if it helps.   6.  History of breast Lucas: - Continue Johnny Lucas daily.  7.   Diarrhea: - Continue Imodium as needed.  8.  Hypogammaglobulinemia: - His IgG is 147.  He denies any recurrent infections. - We will consider starting him on IVIG if he develops infections.   Orders placed this encounter:  No orders of the defined types were placed in this encounter.    Derek Jack,  MD [place] [phone]   I, Thana Ates, am acting as a scribe for Dr. Derek Jack.  I, Derek Jack MD, have reviewed the above documentation for accuracy and completeness, and I agree with the above.

## 2022-06-09 NOTE — Patient Instructions (Addendum)
[place] at [facility] Discharge Instructions   You were seen and examined today by Dr. Delton Coombes.  He reviewed your lab results which are normal/stable.   You may increase the gabapentin you take for neuropathy to 2 pills (600 mg) three times a day.  We will proceed with your injection today.   Return as scheduled.    Thank you for choosing [place] at [facility] to provide your oncology and hematology care.  To afford each patient quality time with our provider, please arrive at least 15 minutes before your scheduled appointment time.   If you have a lab appointment with the [facility] please come in thru the Main Entrance and check in at the main information desk.  You need to re-schedule your appointment should you arrive 10 or more minutes late.  We strive to give you quality time with our providers, and arriving late affects you and other patients whose appointments are after yours.  Also, if you no show three or more times for appointments you may be dismissed from the clinic at the providers discretion.     Again, thank you for choosing [facility].  Our hope is that these requests will decrease the amount of time that you wait before being seen by our physicians.       _____________________________________________________________  Should you have questions after your visit to [facility], please contact our office at [phone] and follow the prompts.  Our office hours are 8:00 a.m. and 4:30 p.m. Monday - Friday.  Please note that voicemails left after 4:00 p.m. may not be returned until the following business day.  We are closed weekends and major holidays.  You do have access to a nurse 24-7, just call the main number to the clinic [phone] and do not press any options, hold on the line and a nurse will answer the phone.    For prescription refill requests, have your pharmacy contact our  office and allow 72 hours.    Due to Covid, you will need to wear a mask upon entering the hospital. If you do not have a mask, a mask will be given to you at the Main Entrance upon arrival. For doctor visits, patients may have 1 support person age 104 or older with them. For treatment visits, patients can not have anyone with them due to social distancing guidelines and our immunocompromised population.

## 2022-06-10 ENCOUNTER — Inpatient Hospital Stay (HOSPITAL_COMMUNITY): Payer: Medicare Other

## 2022-06-10 ENCOUNTER — Encounter (HOSPITAL_COMMUNITY): Payer: Self-pay

## 2022-06-10 VITALS — BP 145/70 | HR 58 | Temp 98.0°F | Resp 18

## 2022-06-10 DIAGNOSIS — I10 Essential (primary) hypertension: Secondary | ICD-10-CM | POA: Diagnosis not present

## 2022-06-10 DIAGNOSIS — C9002 Multiple myeloma in relapse: Secondary | ICD-10-CM | POA: Diagnosis not present

## 2022-06-10 DIAGNOSIS — Z5112 Encounter for antineoplastic immunotherapy: Secondary | ICD-10-CM | POA: Diagnosis not present

## 2022-06-10 DIAGNOSIS — R059 Cough, unspecified: Secondary | ICD-10-CM | POA: Diagnosis not present

## 2022-06-10 DIAGNOSIS — C9000 Multiple myeloma not having achieved remission: Secondary | ICD-10-CM

## 2022-06-10 DIAGNOSIS — M7989 Other specified soft tissue disorders: Secondary | ICD-10-CM | POA: Diagnosis not present

## 2022-06-10 DIAGNOSIS — C7951 Secondary malignant neoplasm of bone: Secondary | ICD-10-CM

## 2022-06-10 DIAGNOSIS — G629 Polyneuropathy, unspecified: Secondary | ICD-10-CM | POA: Diagnosis not present

## 2022-06-10 MED ORDER — DENOSUMAB 120 MG/1.7ML ~~LOC~~ SOLN
120.0000 mg | Freq: Once | SUBCUTANEOUS | Status: AC
  Administered 2022-06-10: 120 mg via SUBCUTANEOUS
  Filled 2022-06-10: qty 1.7

## 2022-06-10 NOTE — Patient Instructions (Signed)
[facility]  Discharge Instructions: Thank you for choosing [facility] to provide your oncology and hematology care.  If you have a lab appointment with the Cancer Center, please come in thru the Main Entrance and check in at the main information desk.  Wear comfortable clothing and clothing appropriate for easy access to any Portacath or PICC line.   We strive to give you quality time with your provider. You may need to reschedule your appointment if you arrive late (15 or more minutes).  Arriving late affects you and other patients whose appointments are after yours.  Also, if you miss three or more appointments without notifying the office, you may be dismissed from the clinic at the provider's discretion.      For prescription refill requests, have your pharmacy contact our office and allow 72 hours for refills to be completed.    Today you received the following chemotherapy and/or immunotherapy agents xgeva. Denosumab injection What is this medication? DENOSUMAB (den oh sue mab) slows bone breakdown. Prolia is used to treat osteoporosis in women after menopause and in men, and in people who are taking corticosteroids for 6 months or more. Xgeva is used to treat a high calcium level due to cancer and to prevent bone fractures and other bone problems caused by multiple myeloma or cancer bone metastases. Xgeva is also used to treat giant cell tumor of the bone. This medicine may be used for other purposes; ask your health care provider or pharmacist if you have questions. COMMON BRAND NAME(S): Prolia, XGEVA What should I tell my care team before I take this medication? They need to know if you have any of these conditions: dental disease having surgery or tooth extraction infection kidney disease low levels of calcium or Vitamin D in the blood malnutrition on hemodialysis skin conditions or sensitivity thyroid or parathyroid disease an unusual reaction to  denosumab, other medicines, foods, dyes, or preservatives pregnant or trying to get pregnant breast-feeding How should I use this medication? This medicine is for injection under the skin. It is given by a health care professional in a hospital or clinic setting. A special MedGuide will be given to you before each treatment. Be sure to read this information carefully each time. For Prolia, talk to your pediatrician regarding the use of this medicine in children. Special care may be needed. For Xgeva, talk to your pediatrician regarding the use of this medicine in children. While this drug may be prescribed for children as young as 13 years for selected conditions, precautions do apply. Overdosage: If you think you have taken too much of this medicine contact a poison control center or emergency room at once. NOTE: This medicine is only for you. Do not share this medicine with others. What if I miss a dose? It is important not to miss your dose. Call your doctor or health care professional if you are unable to keep an appointment. What may interact with this medication? Do not take this medicine with any of the following medications: other medicines containing denosumab This medicine may also interact with the following medications: medicines that lower your chance of fighting infection steroid medicines like prednisone or cortisone This list may not describe all possible interactions. Give your health care provider a list of all the medicines, herbs, non-prescription drugs, or dietary supplements you use. Also tell them if you smoke, drink alcohol, or use illegal drugs. Some items may interact with your medicine. What should I   watch for while using this medication? Visit your doctor or health care professional for regular checks on your progress. Your doctor or health care professional may order blood tests and other tests to see how you are doing. Call your doctor or health care professional for  advice if you get a fever, chills or sore throat, or other symptoms of a cold or flu. Do not treat yourself. This drug may decrease your body's ability to fight infection. Try to avoid being around people who are sick. You should make sure you get enough calcium and vitamin D while you are taking this medicine, unless your doctor tells you not to. Discuss the foods you eat and the vitamins you take with your health care professional. See your dentist regularly. Brush and floss your teeth as directed. Before you have any dental work done, tell your dentist you are receiving this medicine. Do not become pregnant while taking this medicine or for 5 months after stopping it. Talk with your doctor or health care professional about your birth control options while taking this medicine. Women should inform their doctor if they wish to become pregnant or think they might be pregnant. There is a potential for serious side effects to an unborn child. Talk to your health care professional or pharmacist for more information. What side effects may I notice from receiving this medication? Side effects that you should report to your doctor or health care professional as soon as possible: allergic reactions like skin rash, itching or hives, swelling of the face, lips, or tongue bone pain breathing problems dizziness jaw pain, especially after dental work redness, blistering, peeling of the skin signs and symptoms of infection like fever or chills; cough; sore throat; pain or trouble passing urine signs of low calcium like fast heartbeat, muscle cramps or muscle pain; pain, tingling, numbness in the hands or feet; seizures unusual bleeding or bruising unusually weak or tired Side effects that usually do not require medical attention (report to your doctor or health care professional if they continue or are bothersome): constipation diarrhea headache joint pain loss of appetite muscle pain runny  nose tiredness upset stomach This list may not describe all possible side effects. Call your doctor for medical advice about side effects. You may report side effects to FDA at 1-800-FDA-1088. Where should I keep my medication? This medicine is only given in a clinic, doctor's office, or other health care setting and will not be stored at home. NOTE: This sheet is a summary. It may not cover all possible information. If you have questions about this medicine, talk to your doctor, pharmacist, or health care provider.  2023 Elsevier/Gold Standard (2018-04-22 00:00:00)       To help prevent nausea and vomiting after your treatment, we encourage you to take your nausea medication as directed.  BELOW ARE SYMPTOMS THAT SHOULD BE REPORTED IMMEDIATELY: *FEVER GREATER THAN 100.4 F (38 C) OR HIGHER *CHILLS OR SWEATING *NAUSEA AND VOMITING THAT IS NOT CONTROLLED WITH YOUR NAUSEA MEDICATION *UNUSUAL SHORTNESS OF BREATH *UNUSUAL BRUISING OR BLEEDING *URINARY PROBLEMS (pain or burning when urinating, or frequent urination) *BOWEL PROBLEMS (unusual diarrhea, constipation, pain near the anus) TENDERNESS IN MOUTH AND THROAT WITH OR WITHOUT PRESENCE OF ULCERS (sore throat, sores in mouth, or a toothache) UNUSUAL RASH, SWELLING OR PAIN  UNUSUAL VAGINAL DISCHARGE OR ITCHING   Items with * indicate a potential emergency and should be followed up as soon as possible or go to the Emergency Department if any problems   should occur.  Please show the CHEMOTHERAPY ALERT CARD or IMMUNOTHERAPY ALERT CARD at check-in to the Emergency Department and triage nurse.  Should you have questions after your visit or need to cancel or reschedule your appointment, please contact [facility] [phone]  and follow the prompts.  Office hours are 8:00 a.m. to 4:30 p.m. Monday - Friday. Please note that voicemails left after 4:00 p.m. may not be returned until the following business day.  We are closed weekends and  major holidays. You have access to a nurse at all times for urgent questions. Please call the main number to the clinic [phone] and follow the prompts.  For any non-urgent questions, you may also contact your provider using MyChart. We now offer e-Visits for anyone 18 and older to request care online for non-urgent symptoms. For details visit [email or web address].   Also download the MyChart app! Go to the app store, search "MyChart", open the app, select [facility], and log in with your MyChart username and password.  Masks are optional in the cancer centers. If you would like for your care team to wear a mask while they are taking care of you, please let them know. For doctor visits, patients may have with them one support person who is at least 74 years old. At this time, visitors are not allowed in the infusion area.  

## 2022-06-10 NOTE — Progress Notes (Signed)
Patient taking calcium as directed.  Denied tooth, jaw, and leg pain.  No recent or upcoming dental visits.  Labs reviewed.  Patient tolerated injection with no complaints voiced.  See MAR for details.  Patient stable during and after injection.  Site clean and dry with no bruising or swelling noted.  Band aid applied.  Vss with discharge and left in satisfactory condition with no s/s of distress noted.   

## 2022-06-17 ENCOUNTER — Other Ambulatory Visit (HOSPITAL_COMMUNITY): Payer: Self-pay

## 2022-06-17 DIAGNOSIS — G4700 Insomnia, unspecified: Secondary | ICD-10-CM

## 2022-06-17 DIAGNOSIS — I10 Essential (primary) hypertension: Secondary | ICD-10-CM

## 2022-06-17 MED ORDER — POMALIDOMIDE 2 MG PO CAPS: ORAL_CAPSULE | ORAL | 0 refills | Status: DC

## 2022-06-17 NOTE — Telephone Encounter (Signed)
Chart reviewed. Pomalyst refilled per last office note with Dr. Katragadda.  

## 2022-06-18 DIAGNOSIS — L814 Other melanin hyperpigmentation: Secondary | ICD-10-CM | POA: Diagnosis not present

## 2022-06-18 DIAGNOSIS — C44329 Squamous cell carcinoma of skin of other parts of face: Secondary | ICD-10-CM | POA: Diagnosis not present

## 2022-06-18 DIAGNOSIS — S30860A Insect bite (nonvenomous) of lower back and pelvis, initial encounter: Secondary | ICD-10-CM | POA: Diagnosis not present

## 2022-06-18 DIAGNOSIS — D225 Melanocytic nevi of trunk: Secondary | ICD-10-CM | POA: Diagnosis not present

## 2022-06-18 DIAGNOSIS — L570 Actinic keratosis: Secondary | ICD-10-CM | POA: Diagnosis not present

## 2022-06-18 DIAGNOSIS — X32XXXD Exposure to sunlight, subsequent encounter: Secondary | ICD-10-CM | POA: Diagnosis not present

## 2022-06-22 ENCOUNTER — Other Ambulatory Visit (HOSPITAL_COMMUNITY): Payer: Self-pay

## 2022-06-22 DIAGNOSIS — C9000 Multiple myeloma not having achieved remission: Secondary | ICD-10-CM

## 2022-06-22 MED ORDER — PANTOPRAZOLE SODIUM 40 MG PO TBEC: 40.0000 mg | DELAYED_RELEASE_TABLET | ORAL | 1 refills | Status: DC

## 2022-07-07 ENCOUNTER — Inpatient Hospital Stay (HOSPITAL_COMMUNITY): Payer: Medicare Other | Attending: Hematology

## 2022-07-07 ENCOUNTER — Encounter (HOSPITAL_COMMUNITY): Payer: Self-pay

## 2022-07-07 ENCOUNTER — Inpatient Hospital Stay (HOSPITAL_COMMUNITY): Payer: Medicare Other

## 2022-07-07 VITALS — BP 136/68 | HR 57 | Temp 97.4°F | Resp 18

## 2022-07-07 DIAGNOSIS — Z95828 Presence of other vascular implants and grafts: Secondary | ICD-10-CM

## 2022-07-07 DIAGNOSIS — Z853 Personal history of malignant neoplasm of breast: Secondary | ICD-10-CM | POA: Diagnosis not present

## 2022-07-07 DIAGNOSIS — Z5112 Encounter for antineoplastic immunotherapy: Secondary | ICD-10-CM | POA: Diagnosis not present

## 2022-07-07 DIAGNOSIS — C9002 Multiple myeloma in relapse: Secondary | ICD-10-CM | POA: Insufficient documentation

## 2022-07-07 DIAGNOSIS — C9000 Multiple myeloma not having achieved remission: Secondary | ICD-10-CM

## 2022-07-07 DIAGNOSIS — D539 Nutritional anemia, unspecified: Secondary | ICD-10-CM | POA: Insufficient documentation

## 2022-07-07 LAB — CBC WITH DIFFERENTIAL/PLATELET
Abs Immature Granulocytes: 0 10*3/uL (ref 0.00–0.07)
Basophils Absolute: 0.1 10*3/uL (ref 0.0–0.1)
Basophils Relative: 3 %
Eosinophils Absolute: 0.5 10*3/uL (ref 0.0–0.5)
Eosinophils Relative: 13 %
HCT: 31.8 % — ABNORMAL LOW (ref 39.0–52.0)
Hemoglobin: 10.6 g/dL — ABNORMAL LOW (ref 13.0–17.0)
Immature Granulocytes: 0 %
Lymphocytes Relative: 25 %
Lymphs Abs: 0.9 10*3/uL (ref 0.7–4.0)
MCH: 36.3 pg — ABNORMAL HIGH (ref 26.0–34.0)
MCHC: 33.3 g/dL (ref 30.0–36.0)
MCV: 108.9 fL — ABNORMAL HIGH (ref 80.0–100.0)
Monocytes Absolute: 0.7 10*3/uL (ref 0.1–1.0)
Monocytes Relative: 20 %
Neutro Abs: 1.4 10*3/uL — ABNORMAL LOW (ref 1.7–7.7)
Neutrophils Relative %: 39 %
Platelets: 138 10*3/uL — ABNORMAL LOW (ref 150–400)
RBC: 2.92 MIL/uL — ABNORMAL LOW (ref 4.22–5.81)
RDW: 15.8 % — ABNORMAL HIGH (ref 11.5–15.5)
WBC: 3.7 10*3/uL — ABNORMAL LOW (ref 4.0–10.5)
nRBC: 0 % (ref 0.0–0.2)

## 2022-07-07 LAB — COMPREHENSIVE METABOLIC PANEL
ALT: 15 U/L (ref 0–44)
AST: 17 U/L (ref 15–41)
Albumin: 3.2 g/dL — ABNORMAL LOW (ref 3.5–5.0)
Alkaline Phosphatase: 29 U/L — ABNORMAL LOW (ref 38–126)
Anion gap: 1 — ABNORMAL LOW (ref 5–15)
BUN: 13 mg/dL (ref 8–23)
CO2: 26 mmol/L (ref 22–32)
Calcium: 7.8 mg/dL — ABNORMAL LOW (ref 8.9–10.3)
Chloride: 109 mmol/L (ref 98–111)
Creatinine, Ser: 1.55 mg/dL — ABNORMAL HIGH (ref 0.61–1.24)
GFR, Estimated: 47 mL/min — ABNORMAL LOW (ref >60)
Glucose, Bld: 98 mg/dL (ref 70–99)
Potassium: 4.5 mmol/L (ref 3.5–5.1)
Sodium: 136 mmol/L (ref 135–145)
Total Bilirubin: 0.6 mg/dL (ref 0.3–1.2)
Total Protein: 5 g/dL — ABNORMAL LOW (ref 6.5–8.1)

## 2022-07-07 LAB — MAGNESIUM: Magnesium: 1.9 mg/dL (ref 1.7–2.4)

## 2022-07-07 MED ORDER — SODIUM CHLORIDE 0.9% FLUSH
10.0000 mL | Freq: Once | INTRAVENOUS | Status: AC
  Administered 2022-07-07: 10 mL via INTRAVENOUS

## 2022-07-07 MED ORDER — DARATUMUMAB-HYALURONIDASE-FIHJ 1800-30000 MG-UT/15ML ~~LOC~~ SOLN
1800.0000 mg | Freq: Once | SUBCUTANEOUS | Status: AC
  Administered 2022-07-07: 1800 mg via SUBCUTANEOUS
  Filled 2022-07-07: qty 15

## 2022-07-07 MED ORDER — HEPARIN SOD (PORK) LOCK FLUSH 100 UNIT/ML IV SOLN
500.0000 [IU] | Freq: Once | INTRAVENOUS | Status: AC
  Administered 2022-07-07: 500 [IU] via INTRAVENOUS

## 2022-07-07 NOTE — Progress Notes (Signed)
Patient presents today for Daratumumab. ANC 1.4 and Ser. Creatinine 1.55, Dr. Delton Coombes made aware, patient is okay for Dara injection today. Calcium is 7.8 patient is okay for [facility] tomorrow since Corrected Calcium is 8.44 per Dr. Delton Coombes. Patient reports taking pre-medications at home, pharmacy aware. Port flushed with good blood return noted. No bruising or swelling at site. Bandaid applied. Patient tolerated Daratumumab injection with no complaints voiced. See MAR for details. Lab reviewed. Injection site clean and dry with no bruising or swelling noted at site. Band aid applied. Vss with discharge and left in satisfactory condition with nos/s of distress noted.

## 2022-07-07 NOTE — Patient Instructions (Signed)
[name]  Discharge Instructions: Thank you for choosing [facility] to provide your oncology and hematology care.  If you have a lab appointment with the [facility], please come in thru the Main Entrance and check in at the main information desk.  Wear comfortable clothing and clothing appropriate for easy access to any Portacath or PICC line.   We strive to give you quality time with your provider. You may need to reschedule your appointment if you arrive late (15 or more minutes).  Arriving late affects you and other patients whose appointments are after yours.  Also, if you miss three or more appointments without notifying the office, you may be dismissed from the clinic at the provider's discretion.      For prescription refill requests, have your pharmacy contact our office and allow 72 hours for refills to be completed.    Today you received the following chemotherapy and/or immunotherapy agents Daratumumab, return as scheduled.   To help prevent nausea and vomiting after your treatment, we encourage you to take your nausea medication as directed.  BELOW ARE SYMPTOMS THAT SHOULD BE REPORTED IMMEDIATELY: *FEVER GREATER THAN 100.4 F (38 C) OR HIGHER *CHILLS OR SWEATING *NAUSEA AND VOMITING THAT IS NOT CONTROLLED WITH YOUR NAUSEA MEDICATION *UNUSUAL SHORTNESS OF BREATH *UNUSUAL BRUISING OR BLEEDING *URINARY PROBLEMS (pain or burning when urinating, or frequent urination) *BOWEL PROBLEMS (unusual diarrhea, constipation, pain near the anus) TENDERNESS IN MOUTH AND THROAT WITH OR WITHOUT PRESENCE OF ULCERS (sore throat, sores in mouth, or a toothache) UNUSUAL RASH, SWELLING OR PAIN  UNUSUAL VAGINAL DISCHARGE OR ITCHING   Items with * indicate a potential emergency and should be followed up as soon as possible or go to the Emergency Department if any problems should occur.  Please show the CHEMOTHERAPY ALERT CARD or IMMUNOTHERAPY ALERT CARD at check-in to  the Emergency Department and triage nurse.  Should you have questions after your visit or need to cancel or reschedule your appointment, please contact [facility] [phone]  and follow the prompts.  Office hours are 8:00 a.m. to 4:30 p.m. Monday - Friday. Please note that voicemails left after 4:00 p.m. may not be returned until the following business day.  We are closed weekends and major holidays. You have access to a nurse at all times for urgent questions. Please call the main number to the clinic [phone] and follow the prompts.  For any non-urgent questions, you may also contact your provider using MyChart. We now offer e-Visits for anyone 78 and older to request care online for non-urgent symptoms. For details visit [email or web address].   Also download the MyChart app! Go to the app store, search "MyChart", open the app, select [facility], and log in with your MyChart username and password.  Masks are optional in the cancer centers. If you would like for your care team to wear a mask while they are taking care of you, please let them know. For doctor visits, patients may have with them one support person who is at least 74 years old. At this time, visitors are not allowed in the infusion area.

## 2022-07-07 NOTE — Progress Notes (Signed)
Ok to proceed with labs.  Creatinine 1.55  T.O. Dr Rhys Martini, PharmD

## 2022-07-08 ENCOUNTER — Inpatient Hospital Stay (HOSPITAL_COMMUNITY): Payer: Medicare Other

## 2022-07-08 VITALS — BP 128/67 | HR 55 | Temp 97.6°F | Resp 18 | Wt 217.8 lb

## 2022-07-08 DIAGNOSIS — C7951 Secondary malignant neoplasm of bone: Secondary | ICD-10-CM

## 2022-07-08 DIAGNOSIS — C9000 Multiple myeloma not having achieved remission: Secondary | ICD-10-CM

## 2022-07-08 DIAGNOSIS — Z853 Personal history of malignant neoplasm of breast: Secondary | ICD-10-CM | POA: Diagnosis not present

## 2022-07-08 DIAGNOSIS — Z5112 Encounter for antineoplastic immunotherapy: Secondary | ICD-10-CM | POA: Diagnosis not present

## 2022-07-08 DIAGNOSIS — C9002 Multiple myeloma in relapse: Secondary | ICD-10-CM | POA: Diagnosis not present

## 2022-07-08 DIAGNOSIS — D539 Nutritional anemia, unspecified: Secondary | ICD-10-CM | POA: Diagnosis not present

## 2022-07-08 MED ORDER — DENOSUMAB 120 MG/1.7ML ~~LOC~~ SOLN
120.0000 mg | Freq: Once | SUBCUTANEOUS | Status: AC
  Administered 2022-07-08: 120 mg via SUBCUTANEOUS
  Filled 2022-07-08: qty 1.7

## 2022-07-08 NOTE — Patient Instructions (Signed)
[facility]  Discharge Instructions: Thank you for choosing [facility] to provide your oncology and hematology care.  If you have a lab appointment with the [facility], please come in thru the Main Entrance and check in at the main information desk.  Wear comfortable clothing and clothing appropriate for easy access to any Portacath or PICC line.   We strive to give you quality time with your provider. You may need to reschedule your appointment if you arrive late (15 or more minutes).  Arriving late affects you and other patients whose appointments are after yours.  Also, if you miss three or more appointments without notifying the office, you may be dismissed from the clinic at the provider's discretion.      For prescription refill requests, have your pharmacy contact our office and allow 72 hours for refills to be completed.    Today you received the following chemotherapy and/or immunotherapy agents Xgeva      To help prevent nausea and vomiting after your treatment, we encourage you to take your nausea medication as directed.  BELOW ARE SYMPTOMS THAT SHOULD BE REPORTED IMMEDIATELY: *FEVER GREATER THAN 100.4 F (38 C) OR HIGHER *CHILLS OR SWEATING *NAUSEA AND VOMITING THAT IS NOT CONTROLLED WITH YOUR NAUSEA MEDICATION *UNUSUAL SHORTNESS OF BREATH *UNUSUAL BRUISING OR BLEEDING *URINARY PROBLEMS (pain or burning when urinating, or frequent urination) *BOWEL PROBLEMS (unusual diarrhea, constipation, pain near the anus) TENDERNESS IN MOUTH AND THROAT WITH OR WITHOUT PRESENCE OF ULCERS (sore throat, sores in mouth, or a toothache) UNUSUAL RASH, SWELLING OR PAIN  UNUSUAL VAGINAL DISCHARGE OR ITCHING   Items with * indicate a potential emergency and should be followed up as soon as possible or go to the Emergency Department if any problems should occur.  Please show the CHEMOTHERAPY ALERT CARD or IMMUNOTHERAPY ALERT CARD at check-in to the Emergency Department  and triage nurse.  Should you have questions after your visit or need to cancel or reschedule your appointment, please contact [facility] [phone]  and follow the prompts.  Office hours are 8:00 a.m. to 4:30 p.m. Monday - Friday. Please note that voicemails left after 4:00 p.m. may not be returned until the following business day.  We are closed weekends and major holidays. You have access to a nurse at all times for urgent questions. Please call the main number to the clinic [phone] and follow the prompts.  For any non-urgent questions, you may also contact your provider using MyChart. We now offer e-Visits for anyone 33 and older to request care online for non-urgent symptoms. For details visit [email or web address].   Also download the MyChart app! Go to the app store, search "MyChart", open the app, select [facility], and log in with your MyChart username and password.  Masks are optional in the cancer centers. If you would like for your care team to wear a mask while they are taking care of you, please let them know. For doctor visits, patients may have with them one support person who is at least 74 years old. At this time, visitors are not allowed in the infusion area.

## 2022-07-08 NOTE — Progress Notes (Signed)
Johnny Lucas presents today for Xgeva injection per the provider's orders.  Patient has been taking Calcium and vitamin D supplements as prescribed.  Stable during administration without incident; injection site WNL; see MAR for injection details.  Patient tolerated procedure well and without incident.  No questions or complaints noted at this time.

## 2022-07-10 DIAGNOSIS — J329 Chronic sinusitis, unspecified: Secondary | ICD-10-CM | POA: Diagnosis not present

## 2022-07-10 DIAGNOSIS — R030 Elevated blood-pressure reading, without diagnosis of hypertension: Secondary | ICD-10-CM | POA: Diagnosis not present

## 2022-07-10 DIAGNOSIS — J40 Bronchitis, not specified as acute or chronic: Secondary | ICD-10-CM | POA: Diagnosis not present

## 2022-07-10 DIAGNOSIS — Z6831 Body mass index (BMI) 31.0-31.9, adult: Secondary | ICD-10-CM | POA: Diagnosis not present

## 2022-07-13 ENCOUNTER — Other Ambulatory Visit (HOSPITAL_COMMUNITY): Payer: Self-pay

## 2022-07-13 DIAGNOSIS — J40 Bronchitis, not specified as acute or chronic: Secondary | ICD-10-CM | POA: Diagnosis not present

## 2022-07-13 DIAGNOSIS — I10 Essential (primary) hypertension: Secondary | ICD-10-CM

## 2022-07-13 DIAGNOSIS — R030 Elevated blood-pressure reading, without diagnosis of hypertension: Secondary | ICD-10-CM | POA: Diagnosis not present

## 2022-07-13 DIAGNOSIS — Z6831 Body mass index (BMI) 31.0-31.9, adult: Secondary | ICD-10-CM | POA: Diagnosis not present

## 2022-07-13 DIAGNOSIS — J329 Chronic sinusitis, unspecified: Secondary | ICD-10-CM | POA: Diagnosis not present

## 2022-07-13 DIAGNOSIS — G4700 Insomnia, unspecified: Secondary | ICD-10-CM

## 2022-07-13 MED ORDER — POMALIDOMIDE 2 MG PO CAPS: ORAL_CAPSULE | ORAL | 0 refills | Status: DC

## 2022-07-13 NOTE — Telephone Encounter (Signed)
Chart reviewed. Pomalyst refilled per last office note with Dr. Katragadda.  

## 2022-07-20 ENCOUNTER — Other Ambulatory Visit: Payer: Self-pay

## 2022-07-27 DIAGNOSIS — Z6830 Body mass index (BMI) 30.0-30.9, adult: Secondary | ICD-10-CM | POA: Diagnosis not present

## 2022-07-27 DIAGNOSIS — R030 Elevated blood-pressure reading, without diagnosis of hypertension: Secondary | ICD-10-CM | POA: Diagnosis not present

## 2022-07-27 DIAGNOSIS — J0100 Acute maxillary sinusitis, unspecified: Secondary | ICD-10-CM | POA: Diagnosis not present

## 2022-07-28 ENCOUNTER — Other Ambulatory Visit (HOSPITAL_COMMUNITY): Payer: Self-pay | Admitting: Hematology

## 2022-07-28 ENCOUNTER — Other Ambulatory Visit: Payer: Self-pay

## 2022-07-28 DIAGNOSIS — C9000 Multiple myeloma not having achieved remission: Secondary | ICD-10-CM

## 2022-07-30 DIAGNOSIS — X32XXXD Exposure to sunlight, subsequent encounter: Secondary | ICD-10-CM | POA: Diagnosis not present

## 2022-07-30 DIAGNOSIS — Z85828 Personal history of other malignant neoplasm of skin: Secondary | ICD-10-CM | POA: Diagnosis not present

## 2022-07-30 DIAGNOSIS — Z08 Encounter for follow-up examination after completed treatment for malignant neoplasm: Secondary | ICD-10-CM | POA: Diagnosis not present

## 2022-07-30 DIAGNOSIS — L570 Actinic keratosis: Secondary | ICD-10-CM | POA: Diagnosis not present

## 2022-08-03 ENCOUNTER — Other Ambulatory Visit (HOSPITAL_COMMUNITY): Payer: Self-pay | Admitting: Physician Assistant

## 2022-08-03 DIAGNOSIS — C9000 Multiple myeloma not having achieved remission: Secondary | ICD-10-CM

## 2022-08-04 ENCOUNTER — Inpatient Hospital Stay: Payer: Medicare Other | Attending: Hematology

## 2022-08-04 ENCOUNTER — Inpatient Hospital Stay: Payer: Medicare Other

## 2022-08-04 VITALS — BP 112/61 | HR 54 | Temp 98.1°F | Resp 18 | Wt 207.0 lb

## 2022-08-04 DIAGNOSIS — N186 End stage renal disease: Secondary | ICD-10-CM | POA: Diagnosis not present

## 2022-08-04 DIAGNOSIS — I132 Hypertensive heart and chronic kidney disease with heart failure and with stage 5 chronic kidney disease, or end stage renal disease: Secondary | ICD-10-CM | POA: Insufficient documentation

## 2022-08-04 DIAGNOSIS — D631 Anemia in chronic kidney disease: Secondary | ICD-10-CM | POA: Insufficient documentation

## 2022-08-04 DIAGNOSIS — C9000 Multiple myeloma not having achieved remission: Secondary | ICD-10-CM

## 2022-08-04 DIAGNOSIS — Z79899 Other long term (current) drug therapy: Secondary | ICD-10-CM | POA: Insufficient documentation

## 2022-08-04 DIAGNOSIS — Z5112 Encounter for antineoplastic immunotherapy: Secondary | ICD-10-CM | POA: Diagnosis not present

## 2022-08-04 LAB — CBC WITH DIFFERENTIAL/PLATELET
Abs Immature Granulocytes: 0.01 10*3/uL (ref 0.00–0.07)
Basophils Absolute: 0.1 10*3/uL (ref 0.0–0.1)
Basophils Relative: 3 %
Eosinophils Absolute: 0.5 10*3/uL (ref 0.0–0.5)
Eosinophils Relative: 15 %
HCT: 29.4 % — ABNORMAL LOW (ref 39.0–52.0)
Hemoglobin: 9.8 g/dL — ABNORMAL LOW (ref 13.0–17.0)
Immature Granulocytes: 0 %
Lymphocytes Relative: 27 %
Lymphs Abs: 0.9 10*3/uL (ref 0.7–4.0)
MCH: 35.6 pg — ABNORMAL HIGH (ref 26.0–34.0)
MCHC: 33.3 g/dL (ref 30.0–36.0)
MCV: 106.9 fL — ABNORMAL HIGH (ref 80.0–100.0)
Monocytes Absolute: 0.6 10*3/uL (ref 0.1–1.0)
Monocytes Relative: 18 %
Neutro Abs: 1.2 10*3/uL — ABNORMAL LOW (ref 1.7–7.7)
Neutrophils Relative %: 37 %
Platelets: 200 10*3/uL (ref 150–400)
RBC: 2.75 MIL/uL — ABNORMAL LOW (ref 4.22–5.81)
RDW: 14.6 % (ref 11.5–15.5)
WBC: 3.2 10*3/uL — ABNORMAL LOW (ref 4.0–10.5)
nRBC: 0 % (ref 0.0–0.2)

## 2022-08-04 LAB — COMPREHENSIVE METABOLIC PANEL
ALT: 32 U/L (ref 0–44)
AST: 19 U/L (ref 15–41)
Albumin: 2.9 g/dL — ABNORMAL LOW (ref 3.5–5.0)
Alkaline Phosphatase: 34 U/L — ABNORMAL LOW (ref 38–126)
Anion gap: 5 (ref 5–15)
BUN: 16 mg/dL (ref 8–23)
CO2: 26 mmol/L (ref 22–32)
Calcium: 9.6 mg/dL (ref 8.9–10.3)
Chloride: 105 mmol/L (ref 98–111)
Creatinine, Ser: 1.5 mg/dL — ABNORMAL HIGH (ref 0.61–1.24)
GFR, Estimated: 49 mL/min — ABNORMAL LOW (ref >60)
Glucose, Bld: 103 mg/dL — ABNORMAL HIGH (ref 70–99)
Potassium: 4.5 mmol/L (ref 3.5–5.1)
Sodium: 136 mmol/L (ref 135–145)
Total Bilirubin: 0.3 mg/dL (ref 0.3–1.2)
Total Protein: 5.4 g/dL — ABNORMAL LOW (ref 6.5–8.1)

## 2022-08-04 LAB — MAGNESIUM: Magnesium: 1.8 mg/dL (ref 1.7–2.4)

## 2022-08-04 MED ORDER — DARATUMUMAB-HYALURONIDASE-FIHJ 1800-30000 MG-UT/15ML ~~LOC~~ SOLN
1800.0000 mg | Freq: Once | SUBCUTANEOUS | Status: AC
  Administered 2022-08-04: 1800 mg via SUBCUTANEOUS
  Filled 2022-08-04: qty 15

## 2022-08-04 NOTE — Patient Instructions (Signed)
[facility]  Discharge Instructions: Thank you for choosing [facility] to provide your oncology and hematology care.  If you have a lab appointment with the [facility], please come in thru the Main Entrance and check in at the main information desk.  Wear comfortable clothing and clothing appropriate for easy access to any Portacath or PICC line.   We strive to give you quality time with your provider. You may need to reschedule your appointment if you arrive late (15 or more minutes).  Arriving late affects you and other patients whose appointments are after yours.  Also, if you miss three or more appointments without notifying the office, you may be dismissed from the clinic at the provider's discretion.      For prescription refill requests, have your pharmacy contact our office and allow 72 hours for refills to be completed.    Today you received the following chemotherapy and/or immunotherapy agents Daratumumab injection.      To help prevent nausea and vomiting after your treatment, we encourage you to take your nausea medication as directed.  BELOW ARE SYMPTOMS THAT SHOULD BE REPORTED IMMEDIATELY: *FEVER GREATER THAN 100.4 F (38 C) OR HIGHER *CHILLS OR SWEATING *NAUSEA AND VOMITING THAT IS NOT CONTROLLED WITH YOUR NAUSEA MEDICATION *UNUSUAL SHORTNESS OF BREATH *UNUSUAL BRUISING OR BLEEDING *URINARY PROBLEMS (pain or burning when urinating, or frequent urination) *BOWEL PROBLEMS (unusual diarrhea, constipation, pain near the anus) TENDERNESS IN MOUTH AND THROAT WITH OR WITHOUT PRESENCE OF ULCERS (sore throat, sores in mouth, or a toothache) UNUSUAL RASH, SWELLING OR PAIN  UNUSUAL VAGINAL DISCHARGE OR ITCHING   Items with * indicate a potential emergency and should be followed up as soon as possible or go to the Emergency Department if any problems should occur.  Please show the CHEMOTHERAPY ALERT CARD or IMMUNOTHERAPY ALERT CARD at check-in  to the Emergency Department and triage nurse.  Should you have questions after your visit or need to cancel or reschedule your appointment, please contact [facility] [phone]  and follow the prompts.  Office hours are 8:00 a.m. to 4:30 p.m. Monday - Friday. Please note that voicemails left after 4:00 p.m. may not be returned until the following business day.  We are closed weekends and major holidays. You have access to a nurse at all times for urgent questions. Please call the main number to the clinic [phone] and follow the prompts.  For any non-urgent questions, you may also contact your provider using MyChart. We now offer e-Visits for anyone 63 and older to request care online for non-urgent symptoms. For details visit [email or web address].   Also download the MyChart app! Go to the app store, search "MyChart", open the app, select [facility], and log in with your MyChart username and password.  Masks are optional in the cancer centers. If you would like for your care team to wear a mask while they are taking care of you, please let them know. For doctor visits, patients may have with them one support person who is at least 74 years old. At this time, visitors are not allowed in the infusion area.

## 2022-08-04 NOTE — Progress Notes (Signed)
Pt presents today for Daratumumab SQ injection per provider's order. Vital signs and other labs WNL for treatment. Pt's ANC is 1.2 and creatinine 1.50. Dr.K made aware and he stated pt is okay to proceed with treatment today.  Pt took pre-meds at home prior to arrival. Pt had a sinus infection and has lost 10 pounds and currently on antibiotics and will be finished Friday. Dr.K made aware and stated to proceed with treatment.  Daratumumab injection given today per MD orders. Tolerated infusion without adverse affects. Vital signs stable. No complaints at this time. Discharged from clinic ambulatory in stable condition. Alert and oriented x 3. F/U with [facility] as scheduled.

## 2022-08-05 ENCOUNTER — Inpatient Hospital Stay: Payer: Medicare Other

## 2022-08-05 ENCOUNTER — Other Ambulatory Visit: Payer: Self-pay

## 2022-08-05 VITALS — BP 114/61 | HR 60 | Temp 97.5°F | Resp 18

## 2022-08-05 DIAGNOSIS — G4700 Insomnia, unspecified: Secondary | ICD-10-CM

## 2022-08-05 DIAGNOSIS — C7951 Secondary malignant neoplasm of bone: Secondary | ICD-10-CM

## 2022-08-05 DIAGNOSIS — C9000 Multiple myeloma not having achieved remission: Secondary | ICD-10-CM | POA: Diagnosis not present

## 2022-08-05 DIAGNOSIS — N179 Acute kidney failure, unspecified: Secondary | ICD-10-CM

## 2022-08-05 DIAGNOSIS — Z79899 Other long term (current) drug therapy: Secondary | ICD-10-CM | POA: Diagnosis not present

## 2022-08-05 DIAGNOSIS — N186 End stage renal disease: Secondary | ICD-10-CM | POA: Diagnosis not present

## 2022-08-05 DIAGNOSIS — I10 Essential (primary) hypertension: Secondary | ICD-10-CM

## 2022-08-05 DIAGNOSIS — Z5112 Encounter for antineoplastic immunotherapy: Secondary | ICD-10-CM | POA: Diagnosis not present

## 2022-08-05 DIAGNOSIS — D631 Anemia in chronic kidney disease: Secondary | ICD-10-CM | POA: Diagnosis not present

## 2022-08-05 DIAGNOSIS — I132 Hypertensive heart and chronic kidney disease with heart failure and with stage 5 chronic kidney disease, or end stage renal disease: Secondary | ICD-10-CM | POA: Diagnosis not present

## 2022-08-05 MED ORDER — POMALIDOMIDE 2 MG PO CAPS: ORAL_CAPSULE | ORAL | 0 refills | Status: DC

## 2022-08-05 MED ORDER — EPOETIN ALFA-EPBX 20000 UNIT/ML IJ SOLN
20000.0000 [IU] | Freq: Once | INTRAMUSCULAR | Status: AC
  Administered 2022-08-05: 20000 [IU] via SUBCUTANEOUS
  Filled 2022-08-05: qty 1

## 2022-08-05 MED ORDER — DENOSUMAB 120 MG/1.7ML ~~LOC~~ SOLN
120.0000 mg | Freq: Once | SUBCUTANEOUS | Status: AC
  Administered 2022-08-05: 120 mg via SUBCUTANEOUS
  Filled 2022-08-05: qty 1.7

## 2022-08-05 NOTE — Patient Instructions (Signed)
[facility]  Discharge Instructions: Thank you for choosing [facility] to provide your oncology and hematology care.  If you have a lab appointment with the [facility], please come in thru the Main Entrance and check in at the main information desk.  Wear comfortable clothing and clothing appropriate for easy access to any Portacath or PICC line.   We strive to give you quality time with your provider. You may need to reschedule your appointment if you arrive late (15 or more minutes).  Arriving late affects you and other patients whose appointments are after yours.  Also, if you miss three or more appointments without notifying the office, you may be dismissed from the clinic at the provider's discretion.      For prescription refill requests, have your pharmacy contact our office and allow 72 hours for refills to be completed.    Today you received the following Xgeva and Retacrit, return as scheduled.   To help prevent nausea and vomiting after your treatment, we encourage you to take your nausea medication as directed.  BELOW ARE SYMPTOMS THAT SHOULD BE REPORTED IMMEDIATELY: *FEVER GREATER THAN 100.4 F (38 C) OR HIGHER *CHILLS OR SWEATING *NAUSEA AND VOMITING THAT IS NOT CONTROLLED WITH YOUR NAUSEA MEDICATION *UNUSUAL SHORTNESS OF BREATH *UNUSUAL BRUISING OR BLEEDING *URINARY PROBLEMS (pain or burning when urinating, or frequent urination) *BOWEL PROBLEMS (unusual diarrhea, constipation, pain near the anus) TENDERNESS IN MOUTH AND THROAT WITH OR WITHOUT PRESENCE OF ULCERS (sore throat, sores in mouth, or a toothache) UNUSUAL RASH, SWELLING OR PAIN  UNUSUAL VAGINAL DISCHARGE OR ITCHING   Items with * indicate a potential emergency and should be followed up as soon as possible or go to the Emergency Department if any problems should occur.  Please show the CHEMOTHERAPY ALERT CARD or IMMUNOTHERAPY ALERT CARD at check-in to the Emergency Department  and triage nurse.  Should you have questions after your visit or need to cancel or reschedule your appointment, please contact [facility] [phone]  and follow the prompts.  Office hours are 8:00 a.m. to 4:30 p.m. Monday - Friday. Please note that voicemails left after 4:00 p.m. may not be returned until the following business day.  We are closed weekends and major holidays. You have access to a nurse at all times for urgent questions. Please call the main number to the clinic [phone] and follow the prompts.  For any non-urgent questions, you may also contact your provider using MyChart. We now offer e-Visits for anyone 22 and older to request care online for non-urgent symptoms. For details visit [email or web address].   Also download the MyChart app! Go to the app store, search "MyChart", open the app, select [facility], and log in with your MyChart username and password.  Masks are optional in the cancer centers. If you would like for your care team to wear a mask while they are taking care of you, please let them know. For doctor visits, patients may have with them one support person who is at least 74 years old. At this time, visitors are not allowed in the infusion area.

## 2022-08-05 NOTE — Telephone Encounter (Signed)
Chart reviewed. Pomalyst refilled per last office note with Dr. Katragadda.  

## 2022-08-05 NOTE — Progress Notes (Signed)
Patient presents today for Xgeva. Per Dr. Delton Coombes patient is to receive Retacrit injection if Hemoglobin is <10. Patient taking calcium as directed. Denied tooth, jaw, and leg pain. No recent or upcoming dental visits. Labs reviewed. Patient tolerated injection with no complaints voiced. See MAR for details. Patient stable during and after injection. Site clean and dry with no bruising or swelling noted. Band aid applied.  Patient presents today for Retacrit injection. Hemoglobin reviewed prior to administration. VSS tolerated without incident or complaint. See MAR for details. Patient stable during and after injection. Patient discharged in satisfactory condition with no s/s of distress noted.

## 2022-08-11 DIAGNOSIS — I10 Essential (primary) hypertension: Secondary | ICD-10-CM | POA: Diagnosis not present

## 2022-08-11 DIAGNOSIS — Z8709 Personal history of other diseases of the respiratory system: Secondary | ICD-10-CM | POA: Diagnosis not present

## 2022-08-11 DIAGNOSIS — R519 Headache, unspecified: Secondary | ICD-10-CM | POA: Diagnosis not present

## 2022-08-11 DIAGNOSIS — Z6830 Body mass index (BMI) 30.0-30.9, adult: Secondary | ICD-10-CM | POA: Diagnosis not present

## 2022-08-14 DIAGNOSIS — J3489 Other specified disorders of nose and nasal sinuses: Secondary | ICD-10-CM | POA: Diagnosis not present

## 2022-08-14 DIAGNOSIS — Z8709 Personal history of other diseases of the respiratory system: Secondary | ICD-10-CM | POA: Diagnosis not present

## 2022-08-14 DIAGNOSIS — R519 Headache, unspecified: Secondary | ICD-10-CM | POA: Diagnosis not present

## 2022-08-18 ENCOUNTER — Other Ambulatory Visit (HOSPITAL_COMMUNITY): Payer: Self-pay | Admitting: Hematology

## 2022-08-18 DIAGNOSIS — Z170 Estrogen receptor positive status [ER+]: Secondary | ICD-10-CM

## 2022-08-18 DIAGNOSIS — C9000 Multiple myeloma not having achieved remission: Secondary | ICD-10-CM

## 2022-08-20 ENCOUNTER — Other Ambulatory Visit: Payer: Self-pay | Admitting: *Deleted

## 2022-08-20 ENCOUNTER — Encounter (HOSPITAL_COMMUNITY): Payer: Self-pay | Admitting: Hematology

## 2022-08-20 ENCOUNTER — Encounter: Payer: Self-pay | Admitting: Hematology

## 2022-08-20 DIAGNOSIS — C9000 Multiple myeloma not having achieved remission: Secondary | ICD-10-CM

## 2022-08-20 MED ORDER — ACYCLOVIR 800 MG PO TABS: 800.0000 mg | ORAL_TABLET | Freq: Two times a day (BID) | ORAL | 3 refills | Status: DC

## 2022-08-25 ENCOUNTER — Inpatient Hospital Stay (HOSPITAL_COMMUNITY): Payer: Medicare Other

## 2022-08-25 ENCOUNTER — Inpatient Hospital Stay: Payer: Medicare Other

## 2022-08-25 DIAGNOSIS — C9000 Multiple myeloma not having achieved remission: Secondary | ICD-10-CM | POA: Diagnosis not present

## 2022-08-25 DIAGNOSIS — Z79899 Other long term (current) drug therapy: Secondary | ICD-10-CM | POA: Diagnosis not present

## 2022-08-25 DIAGNOSIS — D631 Anemia in chronic kidney disease: Secondary | ICD-10-CM | POA: Diagnosis not present

## 2022-08-25 DIAGNOSIS — Z5112 Encounter for antineoplastic immunotherapy: Secondary | ICD-10-CM | POA: Diagnosis not present

## 2022-08-25 DIAGNOSIS — D649 Anemia, unspecified: Secondary | ICD-10-CM

## 2022-08-25 DIAGNOSIS — N186 End stage renal disease: Secondary | ICD-10-CM | POA: Diagnosis not present

## 2022-08-25 DIAGNOSIS — I132 Hypertensive heart and chronic kidney disease with heart failure and with stage 5 chronic kidney disease, or end stage renal disease: Secondary | ICD-10-CM | POA: Diagnosis not present

## 2022-08-25 LAB — COMPREHENSIVE METABOLIC PANEL
ALT: 15 U/L (ref 0–44)
AST: 13 U/L — ABNORMAL LOW (ref 15–41)
Albumin: 3.1 g/dL — ABNORMAL LOW (ref 3.5–5.0)
Alkaline Phosphatase: 35 U/L — ABNORMAL LOW (ref 38–126)
Anion gap: 5 (ref 5–15)
BUN: 13 mg/dL (ref 8–23)
CO2: 23 mmol/L (ref 22–32)
Calcium: 8 mg/dL — ABNORMAL LOW (ref 8.9–10.3)
Chloride: 110 mmol/L (ref 98–111)
Creatinine, Ser: 1.24 mg/dL (ref 0.61–1.24)
GFR, Estimated: >60 mL/min (ref >60)
Glucose, Bld: 102 mg/dL — ABNORMAL HIGH (ref 70–99)
Potassium: 4.5 mmol/L (ref 3.5–5.1)
Sodium: 138 mmol/L (ref 135–145)
Total Bilirubin: 0.6 mg/dL (ref 0.3–1.2)
Total Protein: 5.7 g/dL — ABNORMAL LOW (ref 6.5–8.1)

## 2022-08-25 LAB — CBC WITH DIFFERENTIAL/PLATELET
Abs Immature Granulocytes: 0.02 10*3/uL (ref 0.00–0.07)
Basophils Absolute: 0.1 10*3/uL (ref 0.0–0.1)
Basophils Relative: 2 %
Eosinophils Absolute: 0.2 10*3/uL (ref 0.0–0.5)
Eosinophils Relative: 4 %
HCT: 32.4 % — ABNORMAL LOW (ref 39.0–52.0)
Hemoglobin: 10.5 g/dL — ABNORMAL LOW (ref 13.0–17.0)
Immature Granulocytes: 0 %
Lymphocytes Relative: 15 %
Lymphs Abs: 0.8 10*3/uL (ref 0.7–4.0)
MCH: 35.5 pg — ABNORMAL HIGH (ref 26.0–34.0)
MCHC: 32.4 g/dL (ref 30.0–36.0)
MCV: 109.5 fL — ABNORMAL HIGH (ref 80.0–100.0)
Monocytes Absolute: 0.3 10*3/uL (ref 0.1–1.0)
Monocytes Relative: 6 %
Neutro Abs: 4.1 10*3/uL (ref 1.7–7.7)
Neutrophils Relative %: 73 %
Platelets: 213 10*3/uL (ref 150–400)
RBC: 2.96 MIL/uL — ABNORMAL LOW (ref 4.22–5.81)
RDW: 15.9 % — ABNORMAL HIGH (ref 11.5–15.5)
WBC: 5.6 10*3/uL (ref 4.0–10.5)
nRBC: 0 % (ref 0.0–0.2)

## 2022-08-25 LAB — IRON AND TIBC
Iron: 74 ug/dL (ref 45–182)
Saturation Ratios: 24 % (ref 17.9–39.5)
TIBC: 308 ug/dL (ref 250–450)
UIBC: 234 ug/dL

## 2022-08-25 LAB — VITAMIN B12: Vitamin B-12: 456 pg/mL (ref 180–914)

## 2022-08-25 LAB — FERRITIN: Ferritin: 170 ng/mL (ref 24–336)

## 2022-08-25 LAB — MAGNESIUM: Magnesium: 2.2 mg/dL (ref 1.7–2.4)

## 2022-08-25 LAB — FOLATE: Folate: 32.3 ng/mL (ref >5.9)

## 2022-08-26 LAB — KAPPA/LAMBDA LIGHT CHAINS
Kappa free light chain: 2 mg/L — ABNORMAL LOW (ref 3.3–19.4)
Kappa, lambda light chain ratio: 0.87 (ref 0.26–1.65)
Lambda free light chains: 2.3 mg/L — ABNORMAL LOW (ref 5.7–26.3)

## 2022-08-27 ENCOUNTER — Other Ambulatory Visit (HOSPITAL_COMMUNITY): Payer: Self-pay

## 2022-08-28 LAB — PROTEIN ELECTROPHORESIS, SERUM
A/G Ratio: 1.4 (ref 0.7–1.7)
Albumin ELP: 2.8 g/dL — ABNORMAL LOW (ref 2.9–4.4)
Alpha-1-Globulin: 0.3 g/dL (ref 0.0–0.4)
Alpha-2-Globulin: 0.8 g/dL (ref 0.4–1.0)
Beta Globulin: 0.8 g/dL (ref 0.7–1.3)
Gamma Globulin: 0.2 g/dL — ABNORMAL LOW (ref 0.4–1.8)
Globulin, Total: 2 g/dL — ABNORMAL LOW (ref 2.2–3.9)
M-Spike, %: 0.1 g/dL — ABNORMAL HIGH
Total Protein ELP: 4.8 g/dL — ABNORMAL LOW (ref 6.0–8.5)

## 2022-09-01 ENCOUNTER — Other Ambulatory Visit: Payer: Self-pay | Admitting: Hematology

## 2022-09-01 ENCOUNTER — Inpatient Hospital Stay (HOSPITAL_COMMUNITY): Payer: Medicare Other | Attending: Hematology | Admitting: Hematology

## 2022-09-01 ENCOUNTER — Other Ambulatory Visit: Payer: Self-pay | Admitting: *Deleted

## 2022-09-01 ENCOUNTER — Inpatient Hospital Stay: Payer: Medicare Other

## 2022-09-01 VITALS — BP 115/69 | HR 65 | Temp 97.1°F | Resp 18 | Ht 69.0 in | Wt 205.0 lb

## 2022-09-01 DIAGNOSIS — G629 Polyneuropathy, unspecified: Secondary | ICD-10-CM | POA: Insufficient documentation

## 2022-09-01 DIAGNOSIS — Z9221 Personal history of antineoplastic chemotherapy: Secondary | ICD-10-CM | POA: Insufficient documentation

## 2022-09-01 DIAGNOSIS — Z9484 Stem cells transplant status: Secondary | ICD-10-CM | POA: Diagnosis not present

## 2022-09-01 DIAGNOSIS — Z5112 Encounter for antineoplastic immunotherapy: Secondary | ICD-10-CM | POA: Insufficient documentation

## 2022-09-01 DIAGNOSIS — Z7961 Long term (current) use of immunomodulator: Secondary | ICD-10-CM | POA: Insufficient documentation

## 2022-09-01 DIAGNOSIS — Z923 Personal history of irradiation: Secondary | ICD-10-CM | POA: Insufficient documentation

## 2022-09-01 DIAGNOSIS — R197 Diarrhea, unspecified: Secondary | ICD-10-CM | POA: Insufficient documentation

## 2022-09-01 DIAGNOSIS — C9000 Multiple myeloma not having achieved remission: Secondary | ICD-10-CM

## 2022-09-01 DIAGNOSIS — R200 Anesthesia of skin: Secondary | ICD-10-CM | POA: Insufficient documentation

## 2022-09-01 DIAGNOSIS — C7951 Secondary malignant neoplasm of bone: Secondary | ICD-10-CM | POA: Diagnosis not present

## 2022-09-01 DIAGNOSIS — Z79810 Long term (current) use of selective estrogen receptor modulators (SERMs): Secondary | ICD-10-CM | POA: Diagnosis not present

## 2022-09-01 DIAGNOSIS — C50922 Malignant neoplasm of unspecified site of left male breast: Secondary | ICD-10-CM | POA: Insufficient documentation

## 2022-09-01 DIAGNOSIS — D801 Nonfamilial hypogammaglobulinemia: Secondary | ICD-10-CM | POA: Insufficient documentation

## 2022-09-01 DIAGNOSIS — D539 Nutritional anemia, unspecified: Secondary | ICD-10-CM | POA: Insufficient documentation

## 2022-09-01 DIAGNOSIS — Z7952 Long term (current) use of systemic steroids: Secondary | ICD-10-CM | POA: Diagnosis not present

## 2022-09-01 DIAGNOSIS — K219 Gastro-esophageal reflux disease without esophagitis: Secondary | ICD-10-CM | POA: Insufficient documentation

## 2022-09-01 DIAGNOSIS — I10 Essential (primary) hypertension: Secondary | ICD-10-CM | POA: Insufficient documentation

## 2022-09-01 DIAGNOSIS — Z79899 Other long term (current) drug therapy: Secondary | ICD-10-CM | POA: Diagnosis not present

## 2022-09-01 DIAGNOSIS — Z79624 Long term (current) use of inhibitors of nucleotide synthesis: Secondary | ICD-10-CM | POA: Insufficient documentation

## 2022-09-01 LAB — IMMUNOFIXATION ELECTROPHORESIS
IgA: 7 mg/dL — ABNORMAL LOW (ref 61–437)
IgG (Immunoglobin G), Serum: 158 mg/dL — ABNORMAL LOW (ref 603–1613)
IgM (Immunoglobulin M), Srm: 16 mg/dL (ref 15–143)
Total Protein ELP: 4.8 g/dL — ABNORMAL LOW (ref 6.0–8.5)

## 2022-09-01 MED ORDER — DARATUMUMAB-HYALURONIDASE-FIHJ 1800-30000 MG-UT/15ML ~~LOC~~ SOLN
1800.0000 mg | Freq: Once | SUBCUTANEOUS | Status: AC
  Administered 2022-09-01: 1800 mg via SUBCUTANEOUS
  Filled 2022-09-01: qty 15

## 2022-09-01 NOTE — Patient Instructions (Signed)
[facility]  Discharge Instructions: Thank you for choosing [facility] to provide your oncology and hematology care.  If you have a lab appointment with the Cancer Center, please come in thru the Main Entrance and check in at the main information desk.  Wear comfortable clothing and clothing appropriate for easy access to any Portacath or PICC line.   We strive to give you quality time with your provider. You may need to reschedule your appointment if you arrive late (15 or more minutes).  Arriving late affects you and other patients whose appointments are after yours.  Also, if you miss three or more appointments without notifying the office, you may be dismissed from the clinic at the provider's discretion.      For prescription refill requests, have your pharmacy contact our office and allow 72 hours for refills to be completed.    Today you received the following chemotherapy and/or immunotherapy agents Daratumumab.  Daratumumab; Hyaluronidase Injection What is this medication? DARATUMUMAB; HYALURONIDASE (dar a toom ue mab; hye al ur ON i dase) treats multiple myeloma, a type of bone marrow cancer. Daratumumab works by blocking a protein that causes cancer cells to grow and multiply. This helps to slow or stop the spread of cancer cells. Hyaluronidase works by increasing the absorption of other medications in the body to help them work better. This medication may also be used treat amyloidosis, a condition that causes the buildup of a protein (amyloid) in your body. It works by reducing the buildup of this protein, which decreases symptoms. It is a combination medication that contains a monoclonal antibody. This medicine may be used for other purposes; ask your health care provider or pharmacist if you have questions. COMMON BRAND NAME(S): DARZALEX FASPRO What should I tell my care team before I take this medication? They need to know if you have any of  these conditions: Heart disease Infection, such as chickenpox, cold sores, herpes, hepatitis B Lung or breathing disease An unusual or allergic reaction to daratumumab, hyaluronidase, other medications, foods, dyes, or preservatives Pregnant or trying to get pregnant Breast-feeding How should I use this medication? This medication is injected under the skin. It is given by your care team in a hospital or clinic setting. Talk to your care team about the use of this medication in children. Special care may be needed. Overdosage: If you think you have taken too much of this medicine contact a poison control center or emergency room at once. NOTE: This medicine is only for you. Do not share this medicine with others. What if I miss a dose? Keep appointments for follow-up doses. It is important not to miss your dose. Call your care team if you are unable to keep an appointment. What may interact with this medication? Interactions have not been studied. This list may not describe all possible interactions. Give your health care provider a list of all the medicines, herbs, non-prescription drugs, or dietary supplements you use. Also tell them if you smoke, drink alcohol, or use illegal drugs. Some items may interact with your medicine. What should I watch for while using this medication? Your condition will be monitored carefully while you are receiving this medication. This medication can cause serious allergic reactions. To reduce your risk, your care team may give you other medication to take before receiving this one. Be sure to follow the directions from your care team. This medication can affect the results of blood tests to match   your blood type. These changes can last for up to 6 months after the final dose. Your care team will do blood tests to match your blood type before you start treatment. Tell all of your care team that you are being treated with this medication before receiving a blood  transfusion. This medication can affect the results of some tests used to determine treatment response; extra tests may be needed to evaluate response. Talk to your care team if you wish to become pregnant or think you are pregnant. This medication can cause serious birth defects if taken during pregnancy and for 3 months after the last dose. A reliable form of contraception is recommended while taking this medication and for 3 months after the last dose. Talk to your care team about effective forms of contraception. Do not breast-feed while taking this medication. What side effects may I notice from receiving this medication? Side effects that you should report to your care team as soon as possible: Allergic reactions--skin rash, itching, hives, swelling of the face, lips, tongue, or throat Heart rhythm changes--fast or irregular heartbeat, dizziness, feeling faint or lightheaded, chest pain, trouble breathing Infection--fever, chills, cough, sore throat, wounds that don't heal, pain or trouble when passing urine, general feeling of discomfort or being unwell Infusion reactions--chest pain, shortness of breath or trouble breathing, feeling faint or lightheaded Sudden eye pain or change in vision such as blurry vision, seeing halos around lights, vision loss Unusual bruising or bleeding Side effects that usually do not require medical attention (report to your care team if they continue or are bothersome): Constipation Diarrhea Fatigue Nausea Pain, tingling, or numbness in the hands or feet Swelling of the ankles, hands, or feet This list may not describe all possible side effects. Call your doctor for medical advice about side effects. You may report side effects to FDA at 1-800-FDA-1088. Where should I keep my medication? This medication is given in a hospital or clinic. It will not be stored at home. NOTE: This sheet is a summary. It may not cover all possible information. If you have  questions about this medicine, talk to your doctor, pharmacist, or health care provider.  2023 Elsevier/Gold Standard (2022-04-08 00:00:00)        To help prevent nausea and vomiting after your treatment, we encourage you to take your nausea medication as directed.  BELOW ARE SYMPTOMS THAT SHOULD BE REPORTED IMMEDIATELY: *FEVER GREATER THAN 100.4 F (38 C) OR HIGHER *CHILLS OR SWEATING *NAUSEA AND VOMITING THAT IS NOT CONTROLLED WITH YOUR NAUSEA MEDICATION *UNUSUAL SHORTNESS OF BREATH *UNUSUAL BRUISING OR BLEEDING *URINARY PROBLEMS (pain or burning when urinating, or frequent urination) *BOWEL PROBLEMS (unusual diarrhea, constipation, pain near the anus) TENDERNESS IN MOUTH AND THROAT WITH OR WITHOUT PRESENCE OF ULCERS (sore throat, sores in mouth, or a toothache) UNUSUAL RASH, SWELLING OR PAIN  UNUSUAL VAGINAL DISCHARGE OR ITCHING   Items with * indicate a potential emergency and should be followed up as soon as possible or go to the Emergency Department if any problems should occur.  Please show the CHEMOTHERAPY ALERT CARD or IMMUNOTHERAPY ALERT CARD at check-in to the Emergency Department and triage nurse.  Should you have questions after your visit or need to cancel or reschedule your appointment, please contact [facility] [phone]  and follow the prompts.  Office hours are 8:00 a.m. to 4:30 p.m. Monday - Friday. Please note that voicemails left after 4:00 p.m. may not be returned until the following business day.  We are closed weekends and major holidays. You have access to a nurse at all times for urgent questions. Please call the main number to the clinic [phone] and follow the prompts.  For any non-urgent questions, you may also contact your provider using MyChart. We now offer e-Visits for anyone 61 and older to request care online for non-urgent symptoms. For details visit [email or web address].   Also download the MyChart app! Go to the app  store, search "MyChart", open the app, select [facility], and log in with your MyChart username and password.  Masks are optional in the cancer centers. If you would like for your care team to wear a mask while they are taking care of you, please let them know. You may have one support person who is at least 73 years old accompany you for your appointments.

## 2022-09-01 NOTE — Progress Notes (Signed)
Johnny Lucas, [place] [id number]   CLINIC:  Medical Oncology/Hematology  PCP:  Practice, [facility] [place] Lucas / [place] [id number] [phone]   REASON FOR VISIT:  Follow-up for multiple myeloma  PRIOR THERAPY:  1. RVD x 6 cycles from 03/01/2017 to 06/29/2017. 2. Stem cell transplant on 09/30/2017. 3. Carfilzomib and cyclophosphamide x 2 cycles from 02/14/2018 to 03/29/2018. 4. Carfilzomib x 22 cycles from 04/20/2018 to 01/23/2020.  NGS Results: not done  CURRENT THERAPY: Darzalex Faspro monthly; Pomalyst 2 mg 3/4 weeks; Xgeva monthly  BRIEF ONCOLOGIC HISTORY:  Oncology History  Breast cancer, male ([place])  12/31/2009 Initial Biopsy   Biopsy of L breast    12/31/2009 Pathology Results   Invasive ductal carcinoma, ER/PR+, HER 2 negative   12/31/2009 Imaging   Ultrasound showing a 2.43 x 1.85 x 3 cm hypoechoic spiculated mass in the 12 o clock L breast retroareolar region   01/01/2010 -  Anti-estrogen oral therapy   Tamoxifen 20 mg daily   01/06/2010 Imaging   Bone scan abnormal uptake in the diaphysis of the R humerus, abnormal in the R third, fifth and sixth ribs, lesion also noted in the sternum.   02/03/2010 Surgery   Rod placement and fixation of R humerus by Dr. [name]   02/05/2010 - 02/18/2010 Radiation Therapy   30Gy in 10 fractions of 3 Gy per fraction to R pathologic fracture   03/11/2010 -  Chemotherapy   Denosumab monthly, now every 3 months. Started at [facility]    06/09/2016 Imaging   Three hypermetabolic osseous lesions in the sternum, left ilium and right ilium, as discussed above, likely represent osseous metastases. At this time, these are not recognizable on the CT images. 2. No extra skeletal metastatic disease identified in the neck, chest, abdomen or pelvis.   10/13/2016 Progression   PET shows various new and enlarging osseous metastatic lesions with no definite extra osseous metastatic disease currently  identified.    12/31/2016 Progression   1. Multifocal hypermetabolic osseous metastases throughout the axial and proximal appendicular skeleton, which are increased in size, number and metabolism since 10/13/2016 PET-CT. 2. 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 3. No additional sites of hypermetabolic metastatic disease. 4. Chronic right mastoid sinusitis. 5. Aortic atherosclerosis.  One vessel coronary atherosclerosis.   Multiple myeloma (Johnny Lucas)  02/12/2017 Bone Marrow Biopsy   The marrow was variably cellular with large peritrabecular aggregates of kappa restricted plasma cells (66% by aspirate, 30% by Cd138). Cytogenetics +11.    03/01/2017 - 06/29/2017 Chemotherapy   RVD    05/26/2017 Bone Marrow Biopsy   Performed at [facility]:  Plasma cell myeloma in a 30% cellular marrow with decreased trilineage hematopoiesis and 42% kappa light chain restricted plasma cells on the aspirate smears and large aggregates on the core biopsy.     07/12/2017 - 09/01/2017 Chemotherapy   3 cycles of carfizolmib/cyclophosphamide/dexamethasone     10/08/2017 Bone Marrow Transplant   Autotransplant at [facility]   02/28/2020 -  Chemotherapy   Patient is on Treatment Plan : Daratumumab q28d     Multiple myeloma not having achieved remission ([place])  06/29/2017 Initial Diagnosis   Multiple myeloma not having achieved remission ([place])   02/28/2020 -  Chemotherapy   Patient is on Treatment Plan : Daratumumab q28d       CANCER STAGING:  Cancer Staging  Multiple myeloma ([place]) Staging form: Plasma Cell Myeloma and Plasma Cell Disorders, AJCC 8th Edition - Clinical stage from 05/03/2017: Beta-2-microglobulin (mg/L): 3.5, Albumin (g/dL): 3.4, ISS: Stage II, High-risk cytogenetics: Absent, LDH: Not assessed - Signed by Baird Cancer, PA-C on 05/03/2017   INTERVAL  HISTORY:  Mr. FLORA Lucas, a 76 y.o. male, seen for follow-up and toxicity assessment prior to next cycle of Darzalex.  He reported 1 sinus infection in the last 3 months and had to take antibiotics.  He reportedly lost 15 pounds since March and was trying to lose weight.  Denies any other infections.  Numbness in the extremities has been stable.   REVIEW OF SYSTEMS:  Review of Systems  Constitutional:  Negative for appetite change and fatigue.  Respiratory:  Negative for cough.   Cardiovascular:  Negative for leg swelling.  Skin:  Negative for rash.  Neurological:  Positive for numbness (Hands and feet stable.).  Hematological:  Does not bruise/bleed easily.  All other systems reviewed and are negative.   PAST MEDICAL/SURGICAL HISTORY:  Past Medical History:  Diagnosis Date   Anxiety    Bone metastases 09/10/2016   Breast cancer ([place]) 2011   Stage IV breast cancer; radiation and tamoxifen   Breast cancer, male ([place])    Stage IV breast cancer; radiation and tamoxifen  Overview:  Left breast ca with mets bone  Overview:  METS TO BONE   GERD (gastroesophageal reflux disease)    Hypertension    Macular degeneration    Multiple myeloma ([place]) 02/18/2017   Peripheral neuropathy    Past Surgical History:  Procedure Laterality Date   BACK SURGERY     C2-C3 fusion   BREAST BIOPSY Left    cancer   CATARACT EXTRACTION W/PHACO Left 02/03/2019   Procedure: CATARACT EXTRACTION PHACO AND INTRAOCULAR LENS PLACEMENT ([place]);  Surgeon: Baruch Goldmann, MD;  Location: [facility]S;  Service: Ophthalmology;  Laterality: Left;  CDE: 15.89   CATARACT EXTRACTION W/PHACO Right 07/25/2021   Procedure: CATARACT EXTRACTION PHACO AND INTRAOCULAR LENS PLACEMENT ([facility]);  Surgeon: Baruch Goldmann, MD;  Location: [facility];  Service: Ophthalmology;  Laterality: Right;  CDE   26.71   HERNIA REPAIR     PORTACATH PLACEMENT Left 06/10/2018   Procedure: INSERTION PORT-A-CATH;  Surgeon: Virl Cagey, MD;  Location: [facility]  ORS;  Service: General;  Laterality: Left;   right arm surgery     bone cancer and rod placed    SOCIAL HISTORY:  Social History   Socioeconomic History   Marital status: Married    Spouse name: Not on file   Number of children: 3   Years of education: Not on file   Highest education level: Not on file  Occupational History   Not on file  Tobacco Use   Smoking status: Never   Smokeless tobacco: Former  Scientific laboratory technician Use: Never used  Substance and Sexual Activity   Alcohol use: Yes    Alcohol/week: 24.0 standard drinks of alcohol    Types: 24 Cans of beer per week   Drug use: No   Sexual activity: Yes  Other Topics Concern   Not on file  Social History Narrative   Not on file   Social Determinants of Health   Financial Resource Strain: Low Risk  (11/26/2020)   Overall Financial Resource Strain (CARDIA)    Difficulty of Paying Living Expenses: Not hard at all  Food Insecurity: No Food Insecurity (11/26/2020)  Hunger Vital Sign    Worried About Running Out of Food in the Last Year: Never true    Ran Out of Food in the Last Year: Never true  Transportation Needs: No Transportation Needs (11/26/2020)   PRAPARE - Hydrologist (Medical): No    Lack of Transportation (Non-Medical): No  Physical Activity: Inactive (11/26/2020)   Exercise Vital Sign    Days of Exercise per Week: 0 days    Minutes of Exercise per Session: 0 min  Stress: Stress Concern Present (11/26/2020)   [place]    Feeling of Stress : To some extent  Social Connections: Moderately Integrated (11/26/2020)   Social Connection and Isolation Panel [NHANES]    Frequency of Communication with Friends and Family: More than three times a week    Frequency of Social Gatherings with Friends and Family: Three times a week    Attends Religious Services: 1 to 4 times per year    Active Member of Clubs or  Organizations: No    Attends Archivist Meetings: Never    Marital Status: Married  Human resources officer Violence: Not At Risk (11/26/2020)   Humiliation, Afraid, Rape, and Kick questionnaire    Fear of Current or Ex-Partner: No    Emotionally Abused: No    Physically Abused: No    Sexually Abused: No    FAMILY HISTORY:  Family History  Problem Relation Age of Onset   Stroke Mother    Cancer Maternal Aunt        cancer NOS; died in her 91s   Lung cancer Maternal Uncle        smoker    CURRENT MEDICATIONS:  Current Outpatient Medications  Medication Sig Dispense Refill   acetaminophen (TYLENOL) 500 MG tablet Take 1,000 mg by mouth every 6 (six) hours as needed for moderate pain.     acyclovir (ZOVIRAX) 800 MG tablet Take 1 tablet (800 mg total) by mouth 2 (two) times daily. 180 tablet 3   albuterol (VENTOLIN HFA) 108 (90 Base) MCG/ACT inhaler Inhale 2 puffs into the lungs every 6 (six) hours as needed for wheezing or shortness of breath.     ALPRAZolam (XANAX) 0.5 MG tablet TAKE 1 TABLET BY MOUTH AT BEDTIME AS NEEDED FOR anxiety / SLEEP 30 tablet 5   amoxicillin-clavulanate (AUGMENTIN) 875-125 MG tablet Take 1 tablet by mouth 2 (two) times daily.     aspirin EC 81 MG tablet Take 81 mg by mouth daily.      Azelastine HCl 137 MCG/SPRAY SOLN Place 1 spray into both nostrils daily.     benzonatate (TESSALON) 100 MG capsule Take 100 mg by mouth 3 (three) times daily as needed.     denosumab (XGEVA) 120 MG/1.7ML SOLN injection Inject 120 mg into the skin every 30 (thirty) days.      dexamethasone (DECADRON) 4 MG tablet TAKE FIVE TABLETS BY MOUTH WEEKLY 40 tablet 5   diphenhydrAMINE (BENADRYL) 25 MG tablet Take 25 mg by mouth daily as needed for allergies.     doxycycline (VIBRA-TABS) 100 MG tablet Take 100 mg by mouth 2 (two) times daily.     FLOVENT HFA 110 MCG/ACT inhaler 1 puff 2 (two) times daily.     folic acid (FOLVITE) 1 MG tablet TAKE 1 TABLET BY MOUTH DAILY 90 tablet 2    gabapentin (NEURONTIN) 300 MG capsule TAKE ONE CAPSULE BY MOUTH THREE TIMES DAILY 180 capsule 2  Loperamide HCl (IMODIUM PO) Take by mouth as needed.     loratadine (CLARITIN) 10 MG tablet Take 1 tablet (10 mg total) by mouth daily. 90 tablet 6   metoprolol succinate (TOPROL-XL) 100 MG 24 hr tablet Take 100 mg by mouth daily.     Multiple Vitamin (MULTIVITAMIN WITH MINERALS) TABS tablet Take 1 tablet by mouth daily.      ondansetron (ZOFRAN) 8 MG tablet TAKE 1 TABLET BY MOUTH EVERY 8 HOURS AS NEEDED FOR NAUSEA AND VOMITING 30 tablet 2   pantoprazole (PROTONIX) 40 MG tablet TAKE 1 TABLET EVERY OTHER DAY 45 tablet 6   polyethylene glycol (MIRALAX / GLYCOLAX) packet Take 17 g by mouth daily as needed for mild constipation. 14 each 0   pomalidomide (POMALYST) 2 MG capsule TAKE 1 CAPSULE BY MOUTH EVERY DAY FOR 21 DAYS ON FOLLOWED BY 7 DAYS OFF 21 capsule 0   predniSONE (DELTASONE) 10 MG tablet Take by mouth.     tamoxifen (NOLVADEX) 20 MG tablet TAKE 1 TABLET EVERY DAY 90 tablet 3   No current facility-administered medications for this visit.   Facility-Administered Medications Ordered in Other Visits  Medication Dose Route Frequency Provider Last Rate Last Admin   ondansetron (ZOFRAN) 8 mg in sodium chloride 0.9 % 50 mL IVPB  8 mg Intravenous Once Lockamy, Randi L, NP-C       sodium chloride flush (NS) 0.9 % injection 10 mL  10 mL Intracatheter Once PRN Derek Jack, MD        ALLERGIES:  Allergies  Allergen Reactions   Cefepime     Suspected severe thrombocytopenia is a result of cefepime induced antigen platelet destruction    PHYSICAL EXAM:  Performance status (ECOG): 1 - Symptomatic but completely ambulatory  Vitals:   09/01/22 1021  BP: 115/69  Pulse: 65  Resp: 18  Temp: (!) 97.1 F (36.2 C)  SpO2: 100%   Wt Readings from Last 3 Encounters:  09/01/22 205 lb (93 kg)  08/04/22 207 lb (93.9 kg)  07/08/22 217 lb 12.8 oz (98.8 kg)   Physical Exam Vitals reviewed.   Constitutional:      Appearance: Normal appearance. He is obese.  Cardiovascular:     Rate and Rhythm: Normal rate and regular rhythm.     Pulses: Normal pulses.     Heart sounds: Normal heart sounds.  Pulmonary:     Effort: Pulmonary effort is normal.     Breath sounds: Normal breath sounds.  Musculoskeletal:     Right lower leg: No edema.     Left lower leg: No edema.  Neurological:     General: No focal deficit present.     Mental Status: He is alert and oriented to person, place, and time.  Psychiatric:        Mood and Affect: Mood normal.        Behavior: Behavior normal.     LABORATORY DATA:  I have reviewed the labs as listed.     Latest Ref Rng & Units 08/25/2022   11:09 AM 08/04/2022    8:05 AM 07/07/2022    8:12 AM  CBC  WBC 4.0 - 10.5 K/uL 5.6  3.2  3.7   Hemoglobin 13.0 - 17.0 g/dL 10.5  9.8  10.6   Hematocrit 39.0 - 52.0 % 32.4  29.4  31.8   Platelets 150 - 400 K/uL 213  200  138       Latest Ref Rng & Units 08/25/2022   11:09  AM 08/04/2022    8:05 AM 07/07/2022    8:12 AM  CMP  Glucose 70 - 99 mg/dL 102  103  98   BUN 8 - 23 mg/dL _0 Creatinine 0.61 - 1.24 mg/dL 1.24  1.50  1.55   Sodium 135 - 145 mmol/L 138  136  136   Potassium 3.5 - 5.1 mmol/L 4.5  4.5  4.5   Chloride 98 - 111 mmol/L 110  105  109   CO2 22 - 32 mmol/L _1 Calcium 8.9 - 10.3 mg/dL 8.0  9.6  7.8   Total Protein 6.5 - 8.1 g/dL 5.7  5.4  5.0   Total Bilirubin 0.3 - 1.2 mg/dL 0.6  0.3  0.6   Alkaline Phos 38 - 126 U/L 35  34  29   AST 15 - 41 U/L _2 ALT 0 - 44 U/L 15  32  15     DIAGNOSTIC IMAGING:  I have independently reviewed the scans and discussed with the patient. No results found.   ASSESSMENT:  1.  Relapsed multiple myeloma: -Autologous stem cell transplant on 10/08/2017 -Post transplant consolidation with CyCarD for 2 cycles with M spike undetectable. -Maintenance therapy with carfilzomib 70 mg per metered square days 1 and 15 every 28 days  from 04/20/2018, dose reduced to 35 mg per metered square on 01/05/2019, titrated up to 56 mg per metered square on 01/09/2020. 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 -PET scan on 02/18/2020 showed new left supraclavicular lymph node at the thoracic inlet, SUV 14.2.  Multifocal osseous lesions largely improved, though residual lesions noted including right acromion, left acromion, thyroid cartilage, right sternum.  Additional lesions throughout the sternum, bilateral ribs, thoracolumbar spine, bilateral pelvis have resolved. -BMBX on 02/14/2020 shows plasma cell myeloma, 60-70% of cells.  FISH for myeloma was negative.  Cytogenetics was normal. -4 cycles of daratumumab, bortezomib and dexamethasone from 02/28/2020 through 04/29/2020. -Daratumumab, pomalidomide and dexamethasone started on 05/28/2020.  He is receiving daratumumab every 2 weeks, Pomalyst 3 mg days 1-21, dexamethasone 20 mg weekly. -Pomalidomide dose reduced to 2 mg days 1-21 around the first week of July 2021, due to severe weakness.   PLAN:  1.  Relapsed IgG kappa multiple myeloma: - He is tolerating pomalidomide and monthly Darzalex very well. - Reviewed labs from 08/25/2022.  M spike is stable at 0.1 g.  Kappa light chains at 2.0 and light chain ratio 0.87.  CBC was grossly normal. - Continue pomalidomide 2 mg 3 weeks on/1 week off.  He will take dexamethasone 20 mg on the days of Darzalex infusion once a month. - He has follow-up appointment at [facility] on 09/14/2022.  Continue monthly Darzalex.  RTC 3 months for follow-up with daratumumab specific immunofixation.   2.  Myeloma bone disease: - Continue denosumab monthly.   3.  Macrocytic anemia: - Hemoglobin is 10.5 with MCV of 109.  Ferritin was 170 and percent saturation 24.  H70 and folic acid was normal.  Continue Procrit if hemoglobin drops below 9-10. - Repeat ferritin and iron panel at next visit.  If ferritin and percent  saturation drops any further down, we will consider parenteral iron therapy.   4.  ID prophylaxis: - Continue acyclovir twice daily.   5.  Peripheral neuropathy: - Continue gabapentin 600 mg 3 times daily.  Numbness is stable.  6.  History of breast cancer: - Continue tamoxifen daily.  7.  Diarrhea: - Continue Imodium as needed.  8.  Hypogammaglobulinemia: - Last quantitative IgG level was low at 147.  He had 1 sinus infection so far this year. - We will consider restarting IVIG if recurrent infections.   Orders placed this encounter:  No orders of the defined types were placed in this encounter.    Derek Jack, MD [place] [phone]

## 2022-09-01 NOTE — Progress Notes (Signed)
Patient has been examined by Dr. Delton Coombes, and vital signs and labs have been reviewed. ANC (1.2), Creatinine, LFTs, hemoglobin, and platelets are within treatment parameters per M.D. - pt may proceed with treatment.  Primary RN and pharmacy notified.

## 2022-09-01 NOTE — Progress Notes (Signed)
Patient presents today for chemotherapy injection.  Patient is in satisfactory condition with no complaints voiced.  Vital signs are stable.  Labs from 08/25/22 reviewed by Dr. Delton Coombes during his office visit.  All labs are within treatment parameters.  Pre-medications were taken about 0900 at home prior to visit. We will proceed with treatment per MD orders.   Patient tolerated injection well with no complaints voiced.  Patient left ambulatory in stable condition.  Vital signs stable at discharge.  Follow up as scheduled.

## 2022-09-01 NOTE — Patient Instructions (Signed)
[facility] Discharge Instructions   You were seen and examined today by Dr. Katragadda.  He reviewed your lab work which is normal/stable.   We will proceed with your treatment today.  Return as scheduled.    Thank you for choosing [facility] at [facility] to provide your oncology and hematology care.  To afford each patient quality time with our provider, please arrive at least 15 minutes before your scheduled appointment time.   If you have a lab appointment with the Cancer Center please come in thru the Main Entrance and check in at the main information desk.  You need to re-schedule your appointment should you arrive 10 or more minutes late.  We strive to give you quality time with our providers, and arriving late affects you and other patients whose appointments are after yours.  Also, if you no show three or more times for appointments you may be dismissed from the clinic at the providers discretion.     Again, thank you for choosing [facility].  Our hope is that these requests will decrease the amount of time that you wait before being seen by our physicians.       _____________________________________________________________  Should you have questions after your visit to [facility], please contact our office at [phone] and follow the prompts.  Our office hours are 8:00 a.m. and 4:30 p.m. Monday - Friday.  Please note that voicemails left after 4:00 p.m. may not be returned until the following business day.  We are closed weekends and major holidays.  You do have access to a nurse 24-7, just call the main number to the clinic [phone] and do not press any options, hold on the line and a nurse will answer the phone.    For prescription refill requests, have your pharmacy contact our office and allow 72 hours.    Due to Covid, you will need to wear a mask upon entering the hospital. If  you do not have a mask, a mask will be given to you at the Main Entrance upon arrival. For doctor visits, patients may have 1 support person age 18 or older with them. For treatment visits, patients can not have anyone with them due to social distancing guidelines and our immunocompromised population.      

## 2022-09-01 NOTE — Progress Notes (Signed)
Patient is taking Pomalyst as prescribed.  He has not missed any doses and reports no side effects at this time.    Patient has been examined by Dr. Katragadda, and vital signs and labs have been reviewed. ANC, Creatinine, LFTs, hemoglobin, and platelets are within treatment parameters per M.D. - pt may proceed with treatment.  Primary RN and pharmacy notified.  

## 2022-09-02 ENCOUNTER — Other Ambulatory Visit: Payer: Self-pay

## 2022-09-02 ENCOUNTER — Inpatient Hospital Stay: Payer: Medicare Other | Attending: Hematology

## 2022-09-02 DIAGNOSIS — C9000 Multiple myeloma not having achieved remission: Secondary | ICD-10-CM

## 2022-09-02 DIAGNOSIS — I10 Essential (primary) hypertension: Secondary | ICD-10-CM

## 2022-09-02 DIAGNOSIS — C7951 Secondary malignant neoplasm of bone: Secondary | ICD-10-CM

## 2022-09-02 DIAGNOSIS — N179 Acute kidney failure, unspecified: Secondary | ICD-10-CM

## 2022-09-02 DIAGNOSIS — C9002 Multiple myeloma in relapse: Secondary | ICD-10-CM | POA: Diagnosis not present

## 2022-09-02 DIAGNOSIS — G4700 Insomnia, unspecified: Secondary | ICD-10-CM

## 2022-09-02 MED ORDER — POMALIDOMIDE 2 MG PO CAPS: ORAL_CAPSULE | ORAL | 0 refills | Status: DC

## 2022-09-02 MED ORDER — DENOSUMAB 120 MG/1.7ML ~~LOC~~ SOLN
120.0000 mg | Freq: Once | SUBCUTANEOUS | Status: AC
  Administered 2022-09-02: 120 mg via SUBCUTANEOUS
  Filled 2022-09-02: qty 1.7

## 2022-09-02 MED ORDER — EPOETIN ALFA-EPBX 10000 UNIT/ML IJ SOLN: 20000.0000 [IU] | Freq: Once | INTRAMUSCULAR | Status: DC

## 2022-09-02 NOTE — Patient Instructions (Signed)
[facility]  Discharge Instructions: Thank you for choosing [facility] to provide your oncology and hematology care.  If you have a lab appointment with the [facility], please come in thru the Main Entrance and check in at the main information desk.  Wear comfortable clothing and clothing appropriate for easy access to any Portacath or PICC line.   We strive to give you quality time with your provider. You may need to reschedule your appointment if you arrive late (15 or more minutes).  Arriving late affects you and other patients whose appointments are after yours.  Also, if you miss three or more appointments without notifying the office, you may be dismissed from the clinic at the provider's discretion.      For prescription refill requests, have your pharmacy contact our office and allow 72 hours for refills to be completed.    Today you received the following chemotherapy and/or immunotherapy agents xgeva.  Denosumab Injection (Oncology) What is this medication? DENOSUMAB (den oh SUE mab) prevents weakened bones caused by cancer. It may also be used to treat noncancerous bone tumors that cannot be removed by surgery. It can also be used to treat high calcium levels in the blood caused by cancer. It works by blocking a protein that causes bones to break down quickly. This slows down the release of calcium from bones, which lowers calcium levels in your blood. It also makes your bones stronger and less likely to break (fracture). This medicine may be used for other purposes; ask your health care provider or pharmacist if you have questions. COMMON BRAND NAME(S): XGEVA What should I tell my care team before I take this medication? They need to know if you have any of these conditions: Dental disease Having surgery or tooth extraction Infection Kidney disease Low levels of calcium or vitamin D in the blood Malnutrition On hemodialysis Skin conditions  or sensitivity Thyroid or parathyroid disease An unusual reaction to denosumab, other medications, foods, dyes, or preservatives Pregnant or trying to get pregnant Breast-feeding How should I use this medication? This medication is for injection under the skin. It is given by your care team in a hospital or clinic setting. A special MedGuide will be given to you before each treatment. Be sure to read this information carefully each time. Talk to your care team about the use of this medication in children. While it may be prescribed for children as young as 13 years for selected conditions, precautions do apply. Overdosage: If you think you have taken too much of this medicine contact a poison control center or emergency room at once. NOTE: This medicine is only for you. Do not share this medicine with others. What if I miss a dose? Keep appointments for follow-up doses. It is important not to miss your dose. Call your care team if you are unable to keep an appointment. What may interact with this medication? Do not take this medication with any of the following: Other medications containing denosumab This medication may also interact with the following: Medications that lower your chance of fighting infection Steroid medications, such as prednisone or cortisone This list may not describe all possible interactions. Give your health care provider a list of all the medicines, herbs, non-prescription drugs, or dietary supplements you use. Also tell them if you smoke, drink alcohol, or use illegal drugs. Some items may interact with your medicine. What should I watch for while using this medication? Your condition will  be monitored carefully while you are receiving this medication. You may need blood work while taking this medication. This medication may increase your risk of getting an infection. Call your care team for advice if you get a fever, chills, sore throat, or other symptoms of a cold or  flu. Do not treat yourself. Try to avoid being around people who are sick. You should make sure you get enough calcium and vitamin D while you are taking this medication, unless your care team tells you not to. Discuss the foods you eat and the vitamins you take with your care team. Some people who take this medication have severe bone, joint, or muscle pain. This medication may also increase your risk for jaw problems or a broken thigh bone. Tell your care team right away if you have severe pain in your jaw, bones, joints, or muscles. Tell your care team if you have any pain that does not go away or that gets worse. Talk to your care team if you may be pregnant. Serious birth defects can occur if you take this medication during pregnancy and for 5 months after the last dose. You will need a negative pregnancy test before starting this medication. Contraception is recommended while taking this medication and for 5 months after the last dose. Your care team can help you find the option that works for you. What side effects may I notice from receiving this medication? Side effects that you should report to your care team as soon as possible: Allergic reactions--skin rash, itching, hives, swelling of the face, lips, tongue, or throat Bone, joint, or muscle pain Low calcium level--muscle pain or cramps, confusion, tingling, or numbness in the hands or feet Osteonecrosis of the jaw--pain, swelling, or redness in the mouth, numbness of the jaw, poor healing after dental work, unusual discharge from the mouth, visible bones in the mouth Side effects that usually do not require medical attention (report to your care team if they continue or are bothersome): Cough Diarrhea Fatigue Headache Nausea This list may not describe all possible side effects. Call your doctor for medical advice about side effects. You may report side effects to FDA at 1-800-FDA-1088. Where should I keep my medication? This medication  is given in a hospital or clinic. It will not be stored at home. NOTE: This sheet is a summary. It may not cover all possible information. If you have questions about this medicine, talk to your doctor, pharmacist, or health care provider.  2023 Elsevier/Gold Standard (2022-05-04 00:00:00)        To help prevent nausea and vomiting after your treatment, we encourage you to take your nausea medication as directed.  BELOW ARE SYMPTOMS THAT SHOULD BE REPORTED IMMEDIATELY: *FEVER GREATER THAN 100.4 F (38 C) OR HIGHER *CHILLS OR SWEATING *NAUSEA AND VOMITING THAT IS NOT CONTROLLED WITH YOUR NAUSEA MEDICATION *UNUSUAL SHORTNESS OF BREATH *UNUSUAL BRUISING OR BLEEDING *URINARY PROBLEMS (pain or burning when urinating, or frequent urination) *BOWEL PROBLEMS (unusual diarrhea, constipation, pain near the anus) TENDERNESS IN MOUTH AND THROAT WITH OR WITHOUT PRESENCE OF ULCERS (sore throat, sores in mouth, or a toothache) UNUSUAL RASH, SWELLING OR PAIN  UNUSUAL VAGINAL DISCHARGE OR ITCHING   Items with * indicate a potential emergency and should be followed up as soon as possible or go to the Emergency Department if any problems should occur.  Please show the CHEMOTHERAPY ALERT CARD or IMMUNOTHERAPY ALERT CARD at check-in to the Emergency Department and triage nurse.  Should you have   questions after your visit or need to cancel or reschedule your appointment, please contact [facility] [phone]  and follow the prompts.  Office hours are 8:00 a.m. to 4:30 p.m. Monday - Friday. Please note that voicemails left after 4:00 p.m. may not be returned until the following business day.  We are closed weekends and major holidays. You have access to a nurse at all times for urgent questions. Please call the main number to the clinic [phone] and follow the prompts.  For any non-urgent questions, you may also contact your provider using MyChart. We now offer e-Visits for anyone 18  and older to request care online for non-urgent symptoms. For details visit [email or web address].   Also download the MyChart app! Go to the app store, search "MyChart", open the app, select [facility], and log in with your MyChart username and password.  Masks are optional in the cancer centers. If you would like for your care team to wear a mask while they are taking care of you, please let them know. You may have one support person who is at least 74 years old accompany you for your appointments.  

## 2022-09-02 NOTE — Progress Notes (Signed)
Patient taking calcium as directed.  Denied tooth, jaw, and leg pain.  No recent or upcoming dental visits.  Labs reviewed.  Patient tolerated injection with no complaints voiced.  See MAR for details.  Patient stable during and after injection.  Site clean and dry with no bruising or swelling noted.  Band aid applied.  Vss with discharge and left in satisfactory condition with no s/s of distress noted.   

## 2022-09-02 NOTE — Telephone Encounter (Signed)
Chart reviewed. Pomalyst refilled per last office note with Dr. Katragadda.  

## 2022-09-11 DIAGNOSIS — I10 Essential (primary) hypertension: Secondary | ICD-10-CM | POA: Diagnosis not present

## 2022-09-11 DIAGNOSIS — Z1389 Encounter for screening for other disorder: Secondary | ICD-10-CM | POA: Diagnosis not present

## 2022-09-11 DIAGNOSIS — Z8709 Personal history of other diseases of the respiratory system: Secondary | ICD-10-CM | POA: Diagnosis not present

## 2022-09-11 DIAGNOSIS — C9000 Multiple myeloma not having achieved remission: Secondary | ICD-10-CM | POA: Diagnosis not present

## 2022-09-11 DIAGNOSIS — Z6829 Body mass index (BMI) 29.0-29.9, adult: Secondary | ICD-10-CM | POA: Diagnosis not present

## 2022-09-11 DIAGNOSIS — Z853 Personal history of malignant neoplasm of breast: Secondary | ICD-10-CM | POA: Diagnosis not present

## 2022-09-11 DIAGNOSIS — Z23 Encounter for immunization: Secondary | ICD-10-CM | POA: Diagnosis not present

## 2022-09-11 DIAGNOSIS — N529 Male erectile dysfunction, unspecified: Secondary | ICD-10-CM | POA: Diagnosis not present

## 2022-09-11 DIAGNOSIS — Z1331 Encounter for screening for depression: Secondary | ICD-10-CM | POA: Diagnosis not present

## 2022-09-14 DIAGNOSIS — C50929 Malignant neoplasm of unspecified site of unspecified male breast: Secondary | ICD-10-CM | POA: Diagnosis not present

## 2022-09-14 DIAGNOSIS — Z170 Estrogen receptor positive status [ER+]: Secondary | ICD-10-CM | POA: Diagnosis not present

## 2022-09-14 DIAGNOSIS — E538 Deficiency of other specified B group vitamins: Secondary | ICD-10-CM | POA: Diagnosis not present

## 2022-09-14 DIAGNOSIS — Z79624 Long term (current) use of inhibitors of nucleotide synthesis: Secondary | ICD-10-CM | POA: Diagnosis not present

## 2022-09-14 DIAGNOSIS — G629 Polyneuropathy, unspecified: Secondary | ICD-10-CM | POA: Diagnosis not present

## 2022-09-14 DIAGNOSIS — Z7982 Long term (current) use of aspirin: Secondary | ICD-10-CM | POA: Diagnosis not present

## 2022-09-14 DIAGNOSIS — C9000 Multiple myeloma not having achieved remission: Secondary | ICD-10-CM | POA: Diagnosis not present

## 2022-09-14 DIAGNOSIS — Z79810 Long term (current) use of selective estrogen receptor modulators (SERMs): Secondary | ICD-10-CM | POA: Diagnosis not present

## 2022-09-14 DIAGNOSIS — N183 Chronic kidney disease, stage 3 unspecified: Secondary | ICD-10-CM | POA: Diagnosis not present

## 2022-09-14 DIAGNOSIS — Z79899 Other long term (current) drug therapy: Secondary | ICD-10-CM | POA: Diagnosis not present

## 2022-09-14 DIAGNOSIS — Z7961 Long term (current) use of immunomodulator: Secondary | ICD-10-CM | POA: Diagnosis not present

## 2022-09-14 DIAGNOSIS — Z79620 Long term (current) use of immunosuppressive biologic: Secondary | ICD-10-CM | POA: Diagnosis not present

## 2022-09-14 DIAGNOSIS — R5383 Other fatigue: Secondary | ICD-10-CM | POA: Diagnosis not present

## 2022-09-14 DIAGNOSIS — D539 Nutritional anemia, unspecified: Secondary | ICD-10-CM | POA: Diagnosis not present

## 2022-09-28 ENCOUNTER — Other Ambulatory Visit: Payer: Self-pay

## 2022-09-28 DIAGNOSIS — I10 Essential (primary) hypertension: Secondary | ICD-10-CM

## 2022-09-28 DIAGNOSIS — G4700 Insomnia, unspecified: Secondary | ICD-10-CM

## 2022-09-28 MED ORDER — POMALIDOMIDE 2 MG PO CAPS: ORAL_CAPSULE | ORAL | 0 refills | Status: DC

## 2022-09-28 NOTE — Telephone Encounter (Signed)
Chart reviewed. Pomalyst refilled per last office note with Dr. Katragadda.  

## 2022-09-29 ENCOUNTER — Inpatient Hospital Stay: Payer: Medicare Other | Attending: Hematology

## 2022-09-29 ENCOUNTER — Inpatient Hospital Stay: Payer: Medicare Other

## 2022-09-29 VITALS — BP 113/67 | HR 54 | Temp 98.4°F | Resp 16

## 2022-09-29 DIAGNOSIS — C9002 Multiple myeloma in relapse: Secondary | ICD-10-CM | POA: Insufficient documentation

## 2022-09-29 DIAGNOSIS — C9000 Multiple myeloma not having achieved remission: Secondary | ICD-10-CM

## 2022-09-29 DIAGNOSIS — Z5111 Encounter for antineoplastic chemotherapy: Secondary | ICD-10-CM | POA: Diagnosis not present

## 2022-09-29 DIAGNOSIS — C7951 Secondary malignant neoplasm of bone: Secondary | ICD-10-CM

## 2022-09-29 DIAGNOSIS — N179 Acute kidney failure, unspecified: Secondary | ICD-10-CM

## 2022-09-29 LAB — CBC WITH DIFFERENTIAL/PLATELET
Abs Immature Granulocytes: 0.01 10*3/uL (ref 0.00–0.07)
Basophils Absolute: 0.1 10*3/uL (ref 0.0–0.1)
Basophils Relative: 2 %
Eosinophils Absolute: 0.4 10*3/uL (ref 0.0–0.5)
Eosinophils Relative: 10 %
HCT: 30.1 % — ABNORMAL LOW (ref 39.0–52.0)
Hemoglobin: 9.7 g/dL — ABNORMAL LOW (ref 13.0–17.0)
Immature Granulocytes: 0 %
Lymphocytes Relative: 27 %
Lymphs Abs: 0.9 10*3/uL (ref 0.7–4.0)
MCH: 34.4 pg — ABNORMAL HIGH (ref 26.0–34.0)
MCHC: 32.2 g/dL (ref 30.0–36.0)
MCV: 106.7 fL — ABNORMAL HIGH (ref 80.0–100.0)
Monocytes Absolute: 0.5 10*3/uL (ref 0.1–1.0)
Monocytes Relative: 15 %
Neutro Abs: 1.6 10*3/uL — ABNORMAL LOW (ref 1.7–7.7)
Neutrophils Relative %: 46 %
Platelets: 166 10*3/uL (ref 150–400)
RBC: 2.82 MIL/uL — ABNORMAL LOW (ref 4.22–5.81)
RDW: 16.6 % — ABNORMAL HIGH (ref 11.5–15.5)
WBC: 3.5 10*3/uL — ABNORMAL LOW (ref 4.0–10.5)
nRBC: 0 % (ref 0.0–0.2)

## 2022-09-29 MED ORDER — EPOETIN ALFA-EPBX 20000 UNIT/ML IJ SOLN
20000.0000 [IU] | Freq: Once | INTRAMUSCULAR | Status: AC
  Administered 2022-09-29: 20000 [IU] via SUBCUTANEOUS
  Filled 2022-09-29: qty 1

## 2022-09-29 MED ORDER — DARATUMUMAB-HYALURONIDASE-FIHJ 1800-30000 MG-UT/15ML ~~LOC~~ SOLN
1800.0000 mg | Freq: Once | SUBCUTANEOUS | Status: AC
  Administered 2022-09-29: 1800 mg via SUBCUTANEOUS
  Filled 2022-09-29: qty 15

## 2022-09-29 NOTE — Patient Instructions (Addendum)
[facility]  Discharge Instructions: Thank you for choosing [facility] to provide your oncology and hematology care.  If you have a lab appointment with the [facility], please come in thru the Main Entrance and check in at the main information desk.  Wear comfortable clothing and clothing appropriate for easy access to any Portacath or PICC line.   We strive to give you quality time with your provider. You may need to reschedule your appointment if you arrive late (15 or more minutes).  Arriving late affects you and other patients whose appointments are after yours.  Also, if you miss three or more appointments without notifying the office, you may be dismissed from the clinic at the provider's discretion.      For prescription refill requests, have your pharmacy contact our office and allow 72 hours for refills to be completed.    Today you received the following chemotherapy and/or immunotherapy agents Retacrit and Daratumumab, return as scheduled.   To help prevent nausea and vomiting after your treatment, we encourage you to take your nausea medication as directed.  BELOW ARE SYMPTOMS THAT SHOULD BE REPORTED IMMEDIATELY: *FEVER GREATER THAN 100.4 F (38 C) OR HIGHER *CHILLS OR SWEATING *NAUSEA AND VOMITING THAT IS NOT CONTROLLED WITH YOUR NAUSEA MEDICATION *UNUSUAL SHORTNESS OF BREATH *UNUSUAL BRUISING OR BLEEDING *URINARY PROBLEMS (pain or burning when urinating, or frequent urination) *BOWEL PROBLEMS (unusual diarrhea, constipation, pain near the anus) TENDERNESS IN MOUTH AND THROAT WITH OR WITHOUT PRESENCE OF ULCERS (sore throat, sores in mouth, or a toothache) UNUSUAL RASH, SWELLING OR PAIN  UNUSUAL VAGINAL DISCHARGE OR ITCHING   Items with * indicate a potential emergency and should be followed up as soon as possible or go to the Emergency Department if any problems should occur.  Please show the CHEMOTHERAPY ALERT CARD or IMMUNOTHERAPY  ALERT CARD at check-in to the Emergency Department and triage nurse.  Should you have questions after your visit or need to cancel or reschedule your appointment, please contact [facility] [phone]  and follow the prompts.  Office hours are 8:00 a.m. to 4:30 p.m. Monday - Friday. Please note that voicemails left after 4:00 p.m. may not be returned until the following business day.  We are closed weekends and major holidays. You have access to a nurse at all times for urgent questions. Please call the main number to the clinic [phone] and follow the prompts.  For any non-urgent questions, you may also contact your provider using MyChart. We now offer e-Visits for anyone 16 and older to request care online for non-urgent symptoms. For details visit [email or web address].   Also download the MyChart app! Go to the app store, search "MyChart", open the app, select [facility], and log in with your MyChart username and password.  Masks are optional in the cancer centers. If you would like for your care team to wear a mask while they are taking care of you, please let them know. You may have one support person who is at least 75 years old accompany you for your appointments.

## 2022-09-29 NOTE — Progress Notes (Signed)
Patient takes oral premedication at home prior to arrival.  Henreitta Leber, PharmD

## 2022-09-29 NOTE — Progress Notes (Signed)
Patient presents today for Daratumuab and Retacrit. Per Dr. Delton Coombes patient is okay to receive Delton See tomorrow with labs in care everywhere. Patient reports taking Pre-medications at home.  Patient presents today for Retacrit injection. Hemoglobin reviewed prior to administration. VSS tolerated without incident or complaint. See MAR for details. Patient stable during and after injection.  Patient tolerated Velcade injection with no complaints voiced. Lab work reviewed. See MAR for details. Injection site clean and dry with no bruising or swelling noted. Patient stable during and after injection. Band aid applied. VSS. Patient left in satisfactory condition with no s/s of distress noted.

## 2022-09-30 ENCOUNTER — Inpatient Hospital Stay: Payer: Medicare Other

## 2022-09-30 VITALS — BP 123/62 | HR 56 | Temp 97.2°F | Resp 20 | Wt 207.0 lb

## 2022-09-30 DIAGNOSIS — C7951 Secondary malignant neoplasm of bone: Secondary | ICD-10-CM

## 2022-09-30 DIAGNOSIS — C9000 Multiple myeloma not having achieved remission: Secondary | ICD-10-CM

## 2022-09-30 DIAGNOSIS — Z5111 Encounter for antineoplastic chemotherapy: Secondary | ICD-10-CM | POA: Diagnosis not present

## 2022-09-30 DIAGNOSIS — C9002 Multiple myeloma in relapse: Secondary | ICD-10-CM | POA: Diagnosis not present

## 2022-09-30 MED ORDER — DENOSUMAB 120 MG/1.7ML ~~LOC~~ SOLN
120.0000 mg | Freq: Once | SUBCUTANEOUS | Status: AC
  Administered 2022-09-30: 120 mg via SUBCUTANEOUS
  Filled 2022-09-30: qty 1.7

## 2022-09-30 NOTE — Patient Instructions (Signed)
[facility]  Discharge Instructions: Thank you for choosing [facility] to provide your oncology and hematology care.  If you have a lab appointment with the [facility], please come in thru the Main Entrance and check in at the main information desk.  Wear comfortable clothing and clothing appropriate for easy access to any Portacath or PICC line.   We strive to give you quality time with your provider. You may need to reschedule your appointment if you arrive late (15 or more minutes).  Arriving late affects you and other patients whose appointments are after yours.  Also, if you miss three or more appointments without notifying the office, you may be dismissed from the clinic at the provider's discretion.      For prescription refill requests, have your pharmacy contact our office and allow 72 hours for refills to be completed.    Today you received the following xgeva   To help prevent nausea and vomiting after your treatment, we encourage you to take your nausea medication as directed.  BELOW ARE SYMPTOMS THAT SHOULD BE REPORTED IMMEDIATELY: *FEVER GREATER THAN 100.4 F (38 C) OR HIGHER *CHILLS OR SWEATING *NAUSEA AND VOMITING THAT IS NOT CONTROLLED WITH YOUR NAUSEA MEDICATION *UNUSUAL SHORTNESS OF BREATH *UNUSUAL BRUISING OR BLEEDING *URINARY PROBLEMS (pain or burning when urinating, or frequent urination) *BOWEL PROBLEMS (unusual diarrhea, constipation, pain near the anus) TENDERNESS IN MOUTH AND THROAT WITH OR WITHOUT PRESENCE OF ULCERS (sore throat, sores in mouth, or a toothache) UNUSUAL RASH, SWELLING OR PAIN  UNUSUAL VAGINAL DISCHARGE OR ITCHING   Items with * indicate a potential emergency and should be followed up as soon as possible or go to the Emergency Department if any problems should occur.  Please show the CHEMOTHERAPY ALERT CARD or IMMUNOTHERAPY ALERT CARD at check-in to the Emergency Department and triage nurse.  Should you  have questions after your visit or need to cancel or reschedule your appointment, please contact [facility] [phone]  and follow the prompts.  Office hours are 8:00 a.m. to 4:30 p.m. Monday - Friday. Please note that voicemails left after 4:00 p.m. may not be returned until the following business day.  We are closed weekends and major holidays. You have access to a nurse at all times for urgent questions. Please call the main number to the clinic [phone] and follow the prompts.  For any non-urgent questions, you may also contact your provider using MyChart. We now offer e-Visits for anyone 33 and older to request care online for non-urgent symptoms. For details visit [email or web address].   Also download the MyChart app! Go to the app store, search "MyChart", open the app, select [facility], and log in with your MyChart username and password.  Masks are optional in the cancer centers. If you would like for your care team to wear a mask while they are taking care of you, please let them know. You may have one support person who is at least 74 years old accompany you for your appointments.  Denosumab Injection (Oncology) What is this medication? DENOSUMAB (den oh SUE mab) prevents weakened bones caused by cancer. It may also be used to treat noncancerous bone tumors that cannot be removed by surgery. It can also be used to treat high calcium levels in the blood caused by cancer. It works by blocking a protein that causes bones to break down quickly. This slows down the release of calcium from bones, which lowers calcium  levels in your blood. It also makes your bones stronger and less likely to break (fracture). This medicine may be used for other purposes; ask your health care provider or pharmacist if you have questions. COMMON BRAND NAME(S): XGEVA What should I tell my care team before I take this medication? They need to know if you have any of these  conditions: Dental disease Having surgery or tooth extraction Infection Kidney disease Low levels of calcium or vitamin D in the blood Malnutrition On hemodialysis Skin conditions or sensitivity Thyroid or parathyroid disease An unusual reaction to denosumab, other medications, foods, dyes, or preservatives Pregnant or trying to get pregnant Breast-feeding How should I use this medication? This medication is for injection under the skin. It is given by your care team in a hospital or clinic setting. A special MedGuide will be given to you before each treatment. Be sure to read this information carefully each time. Talk to your care team about the use of this medication in children. While it may be prescribed for children as young as 13 years for selected conditions, precautions do apply. Overdosage: If you think you have taken too much of this medicine contact a poison control center or emergency room at once. NOTE: This medicine is only for you. Do not share this medicine with others. What if I miss a dose? Keep appointments for follow-up doses. It is important not to miss your dose. Call your care team if you are unable to keep an appointment. What may interact with this medication? Do not take this medication with any of the following: Other medications containing denosumab This medication may also interact with the following: Medications that lower your chance of fighting infection Steroid medications, such as prednisone or cortisone This list may not describe all possible interactions. Give your health care provider a list of all the medicines, herbs, non-prescription drugs, or dietary supplements you use. Also tell them if you smoke, drink alcohol, or use illegal drugs. Some items may interact with your medicine. What should I watch for while using this medication? Your condition will be monitored carefully while you are receiving this medication. You may need blood work while  taking this medication. This medication may increase your risk of getting an infection. Call your care team for advice if you get a fever, chills, sore throat, or other symptoms of a cold or flu. Do not treat yourself. Try to avoid being around people who are sick. You should make sure you get enough calcium and vitamin D while you are taking this medication, unless your care team tells you not to. Discuss the foods you eat and the vitamins you take with your care team. Some people who take this medication have severe bone, joint, or muscle pain. This medication may also increase your risk for jaw problems or a broken thigh bone. Tell your care team right away if you have severe pain in your jaw, bones, joints, or muscles. Tell your care team if you have any pain that does not go away or that gets worse. Talk to your care team if you may be pregnant. Serious birth defects can occur if you take this medication during pregnancy and for 5 months after the last dose. You will need a negative pregnancy test before starting this medication. Contraception is recommended while taking this medication and for 5 months after the last dose. Your care team can help you find the option that works for you. What side effects may I notice  from receiving this medication? Side effects that you should report to your care team as soon as possible: Allergic reactions--skin rash, itching, hives, swelling of the face, lips, tongue, or throat Bone, joint, or muscle pain Low calcium level--muscle pain or cramps, confusion, tingling, or numbness in the hands or feet Osteonecrosis of the jaw--pain, swelling, or redness in the mouth, numbness of the jaw, poor healing after dental work, unusual discharge from the mouth, visible bones in the mouth Side effects that usually do not require medical attention (report to your care team if they continue or are bothersome): Cough Diarrhea Fatigue Headache Nausea This list may not  describe all possible side effects. Call your doctor for medical advice about side effects. You may report side effects to FDA at 1-800-FDA-1088. Where should I keep my medication? This medication is given in a hospital or clinic. It will not be stored at home. NOTE: This sheet is a summary. It may not cover all possible information. If you have questions about this medicine, talk to your doctor, pharmacist, or health care provider.  2023 Elsevier/Gold Standard (2022-05-04 00:00:00)

## 2022-09-30 NOTE — Progress Notes (Signed)
Xgeva injection given per orders. Patient stated no major dental work done or needed. Patient is taking calcium and Vitamin D. Patient stable at discharge. Discharged from clinic ambulatory and follow up as scheduled.

## 2022-10-27 ENCOUNTER — Inpatient Hospital Stay: Payer: Medicare Other

## 2022-10-27 ENCOUNTER — Other Ambulatory Visit: Payer: Self-pay

## 2022-10-27 VITALS — BP 131/60 | HR 54 | Temp 97.0°F | Resp 18

## 2022-10-27 DIAGNOSIS — Z5111 Encounter for antineoplastic chemotherapy: Secondary | ICD-10-CM | POA: Diagnosis not present

## 2022-10-27 DIAGNOSIS — C9000 Multiple myeloma not having achieved remission: Secondary | ICD-10-CM

## 2022-10-27 DIAGNOSIS — C9002 Multiple myeloma in relapse: Secondary | ICD-10-CM | POA: Diagnosis not present

## 2022-10-27 LAB — COMPREHENSIVE METABOLIC PANEL
ALT: 14 U/L (ref 0–44)
AST: 14 U/L — ABNORMAL LOW (ref 15–41)
Albumin: 3.2 g/dL — ABNORMAL LOW (ref 3.5–5.0)
Alkaline Phosphatase: 33 U/L — ABNORMAL LOW (ref 38–126)
Anion gap: 5 (ref 5–15)
BUN: 16 mg/dL (ref 8–23)
CO2: 24 mmol/L (ref 22–32)
Calcium: 8.5 mg/dL — ABNORMAL LOW (ref 8.9–10.3)
Chloride: 107 mmol/L (ref 98–111)
Creatinine, Ser: 1.4 mg/dL — ABNORMAL HIGH (ref 0.61–1.24)
GFR, Estimated: 53 mL/min — ABNORMAL LOW (ref >60)
Glucose, Bld: 97 mg/dL (ref 70–99)
Potassium: 4.8 mmol/L (ref 3.5–5.1)
Sodium: 136 mmol/L (ref 135–145)
Total Bilirubin: 0.6 mg/dL (ref 0.3–1.2)
Total Protein: 5 g/dL — ABNORMAL LOW (ref 6.5–8.1)

## 2022-10-27 LAB — CBC WITH DIFFERENTIAL/PLATELET
Abs Immature Granulocytes: 0 10*3/uL (ref 0.00–0.07)
Basophils Absolute: 0.1 10*3/uL (ref 0.0–0.1)
Basophils Relative: 2 %
Eosinophils Absolute: 0.4 10*3/uL (ref 0.0–0.5)
Eosinophils Relative: 11 %
HCT: 32.2 % — ABNORMAL LOW (ref 39.0–52.0)
Hemoglobin: 10.6 g/dL — ABNORMAL LOW (ref 13.0–17.0)
Immature Granulocytes: 0 %
Lymphocytes Relative: 26 %
Lymphs Abs: 0.9 10*3/uL (ref 0.7–4.0)
MCH: 34.9 pg — ABNORMAL HIGH (ref 26.0–34.0)
MCHC: 32.9 g/dL (ref 30.0–36.0)
MCV: 105.9 fL — ABNORMAL HIGH (ref 80.0–100.0)
Monocytes Absolute: 0.8 10*3/uL (ref 0.1–1.0)
Monocytes Relative: 24 %
Neutro Abs: 1.2 10*3/uL — ABNORMAL LOW (ref 1.7–7.7)
Neutrophils Relative %: 37 %
Platelets: 162 10*3/uL (ref 150–400)
RBC: 3.04 MIL/uL — ABNORMAL LOW (ref 4.22–5.81)
RDW: 16.2 % — ABNORMAL HIGH (ref 11.5–15.5)
WBC: 3.3 10*3/uL — ABNORMAL LOW (ref 4.0–10.5)
nRBC: 0 % (ref 0.0–0.2)

## 2022-10-27 MED ORDER — DARATUMUMAB-HYALURONIDASE-FIHJ 1800-30000 MG-UT/15ML ~~LOC~~ SOLN
1800.0000 mg | Freq: Once | SUBCUTANEOUS | Status: AC
  Administered 2022-10-27: 1800 mg via SUBCUTANEOUS
  Filled 2022-10-27: qty 15

## 2022-10-27 NOTE — Progress Notes (Signed)
Patient presents today for treatment. Darzalex Faspro injection . MAR reviewed and updated. Patient's blood drawn in the clinic peripheral by RN. Labs pending. Patient has no complaints of any changes since his last treatment. Patient took all pre-medications at home prior to arrival. 07:30am. Patient taking Pomalyst PO as prescribed and denies any side effects related to that .   ANC 1.2. Per Dr. Delton Coombes by secure chat, proceed with treatment.   Treatment given today per MD orders. Tolerated without adverse affects. Vital signs stable. No complaints at this time. Discharged from clinic ambulatory in stable condition. Alert and oriented x 3. F/U with [facility] as scheduled.

## 2022-10-27 NOTE — Progress Notes (Signed)
OK to proceed with Darzalex Faspro with ANC 1.2   T.O. Dr Rhys Martini, PharmD

## 2022-10-27 NOTE — Patient Instructions (Signed)
[facility]  Discharge Instructions: Thank you for choosing [facility] to provide your oncology and hematology care.  If you have a lab appointment with the [facility], please come in thru the Main Entrance and check in at the main information desk.  Wear comfortable clothing and clothing appropriate for easy access to any Portacath or PICC line.   We strive to give you quality time with your provider. You may need to reschedule your appointment if you arrive late (15 or more minutes).  Arriving late affects you and other patients whose appointments are after yours.  Also, if you miss three or more appointments without notifying the office, you may be dismissed from the clinic at the provider's discretion.      For prescription refill requests, have your pharmacy contact our office and allow 72 hours for refills to be completed.    Today you received the following chemotherapy and/or immunotherapy agents Darzalex Faspro Injection.  Daratumumab; Hyaluronidase Injection What is this medication? DARATUMUMAB; HYALURONIDASE (dar a toom ue mab; hye al ur ON i dase) treats multiple myeloma, a type of bone marrow cancer. Daratumumab works by blocking a protein that causes cancer cells to grow and multiply. This helps to slow or stop the spread of cancer cells. Hyaluronidase works by increasing the absorption of other medications in the body to help them work better. This medication may also be used treat amyloidosis, a condition that causes the buildup of a protein (amyloid) in your body. It works by reducing the buildup of this protein, which decreases symptoms. It is a combination medication that contains a monoclonal antibody. This medicine may be used for other purposes; ask your health care provider or pharmacist if you have questions. COMMON BRAND NAME(S): DARZALEX FASPRO What should I tell my care team before I take this medication? They need to know if you  have any of these conditions: Heart disease Infection, such as chickenpox, cold sores, herpes, hepatitis B Lung or breathing disease An unusual or allergic reaction to daratumumab, hyaluronidase, other medications, foods, dyes, or preservatives Pregnant or trying to get pregnant Breast-feeding How should I use this medication? This medication is injected under the skin. It is given by your care team in a hospital or clinic setting. Talk to your care team about the use of this medication in children. Special care may be needed. Overdosage: If you think you have taken too much of this medicine contact a poison control center or emergency room at once. NOTE: This medicine is only for you. Do not share this medicine with others. What if I miss a dose? Keep appointments for follow-up doses. It is important not to miss your dose. Call your care team if you are unable to keep an appointment. What may interact with this medication? Interactions have not been studied. This list may not describe all possible interactions. Give your health care provider a list of all the medicines, herbs, non-prescription drugs, or dietary supplements you use. Also tell them if you smoke, drink alcohol, or use illegal drugs. Some items may interact with your medicine. What should I watch for while using this medication? Your condition will be monitored carefully while you are receiving this medication. This medication can cause serious allergic reactions. To reduce your risk, your care team may give you other medication to take before receiving this one. Be sure to follow the directions from your care team. This medication can affect the results of blood tests  to match your blood type. These changes can last for up to 6 months after the final dose. Your care team will do blood tests to match your blood type before you start treatment. Tell all of your care team that you are being treated with this medication before  receiving a blood transfusion. This medication can affect the results of some tests used to determine treatment response; extra tests may be needed to evaluate response. Talk to your care team if you wish to become pregnant or think you are pregnant. This medication can cause serious birth defects if taken during pregnancy and for 3 months after the last dose. A reliable form of contraception is recommended while taking this medication and for 3 months after the last dose. Talk to your care team about effective forms of contraception. Do not breast-feed while taking this medication. What side effects may I notice from receiving this medication? Side effects that you should report to your care team as soon as possible: Allergic reactions--skin rash, itching, hives, swelling of the face, lips, tongue, or throat Heart rhythm changes--fast or irregular heartbeat, dizziness, feeling faint or lightheaded, chest pain, trouble breathing Infection--fever, chills, cough, sore throat, wounds that don't heal, pain or trouble when passing urine, general feeling of discomfort or being unwell Infusion reactions--chest pain, shortness of breath or trouble breathing, feeling faint or lightheaded Sudden eye pain or change in vision such as blurry vision, seeing halos around lights, vision loss Unusual bruising or bleeding Side effects that usually do not require medical attention (report to your care team if they continue or are bothersome): Constipation Diarrhea Fatigue Nausea Pain, tingling, or numbness in the hands or feet Swelling of the ankles, hands, or feet This list may not describe all possible side effects. Call your doctor for medical advice about side effects. You may report side effects to FDA at 1-800-FDA-1088. Where should I keep my medication? This medication is given in a hospital or clinic. It will not be stored at home. NOTE: This sheet is a summary. It may not cover all possible information.  If you have questions about this medicine, talk to your doctor, pharmacist, or health care provider.  2023 Elsevier/Gold Standard (2022-04-08 00:00:00)       To help prevent nausea and vomiting after your treatment, we encourage you to take your nausea medication as directed.  BELOW ARE SYMPTOMS THAT SHOULD BE REPORTED IMMEDIATELY: *FEVER GREATER THAN 100.4 F (38 C) OR HIGHER *CHILLS OR SWEATING *NAUSEA AND VOMITING THAT IS NOT CONTROLLED WITH YOUR NAUSEA MEDICATION *UNUSUAL SHORTNESS OF BREATH *UNUSUAL BRUISING OR BLEEDING *URINARY PROBLEMS (pain or burning when urinating, or frequent urination) *BOWEL PROBLEMS (unusual diarrhea, constipation, pain near the anus) TENDERNESS IN MOUTH AND THROAT WITH OR WITHOUT PRESENCE OF ULCERS (sore throat, sores in mouth, or a toothache) UNUSUAL RASH, SWELLING OR PAIN  UNUSUAL VAGINAL DISCHARGE OR ITCHING   Items with * indicate a potential emergency and should be followed up as soon as possible or go to the Emergency Department if any problems should occur.  Please show the CHEMOTHERAPY ALERT CARD or IMMUNOTHERAPY ALERT CARD at check-in to the Emergency Department and triage nurse.  Should you have questions after your visit or need to cancel or reschedule your appointment, please contact [facility] [phone]  and follow the prompts.  Office hours are 8:00 a.m. to 4:30 p.m. Monday - Friday. Please note that voicemails left after 4:00 p.m. may not be returned until the following business  day.  We are closed weekends and major holidays. You have access to a nurse at all times for urgent questions. Please call the main number to the clinic [phone] and follow the prompts.  For any non-urgent questions, you may also contact your provider using MyChart. We now offer e-Visits for anyone 21 and older to request care online for non-urgent symptoms. For details visit [email or web address].   Also download the MyChart app! Go to  the app store, search "MyChart", open the app, select [facility], and log in with your MyChart username and password.  Masks are optional in the cancer centers. If you would like for your care team to wear a mask while they are taking care of you, please let them know. You may have one support person who is at least 74 years old accompany you for your appointments.

## 2022-10-28 ENCOUNTER — Inpatient Hospital Stay: Payer: Medicare Other | Attending: Hematology

## 2022-10-28 VITALS — BP 134/49 | HR 57

## 2022-10-28 DIAGNOSIS — C9000 Multiple myeloma not having achieved remission: Secondary | ICD-10-CM | POA: Insufficient documentation

## 2022-10-28 DIAGNOSIS — Z79899 Other long term (current) drug therapy: Secondary | ICD-10-CM | POA: Diagnosis not present

## 2022-10-28 DIAGNOSIS — C7951 Secondary malignant neoplasm of bone: Secondary | ICD-10-CM

## 2022-10-28 DIAGNOSIS — N179 Acute kidney failure, unspecified: Secondary | ICD-10-CM

## 2022-10-28 MED ORDER — DENOSUMAB 120 MG/1.7ML ~~LOC~~ SOLN
120.0000 mg | Freq: Once | SUBCUTANEOUS | Status: AC
  Administered 2022-10-28: 120 mg via SUBCUTANEOUS
  Filled 2022-10-28: qty 1.7

## 2022-10-28 MED ORDER — EPOETIN ALFA-EPBX 10000 UNIT/ML IJ SOLN: 20000.0000 [IU] | Freq: Once | INTRAMUSCULAR | Status: DC

## 2022-10-28 NOTE — Patient Instructions (Signed)
[facility]  Discharge Instructions: Thank you for choosing [facility] to provide your oncology and hematology care.  If you have a lab appointment with the [facility], please come in thru the Main Entrance and check in at the main information desk.  Wear comfortable clothing and clothing appropriate for easy access to any Portacath or PICC line.   We strive to give you quality time with your provider. You may need to reschedule your appointment if you arrive late (15 or more minutes).  Arriving late affects you and other patients whose appointments are after yours.  Also, if you miss three or more appointments without notifying the office, you may be dismissed from the clinic at the provider's discretion.      For prescription refill requests, have your pharmacy contact our office and allow 72 hours for refills to be completed.    Today you received the following Xgeva, return as scheduled.   To help prevent nausea and vomiting after your treatment, we encourage you to take your nausea medication as directed.  BELOW ARE SYMPTOMS THAT SHOULD BE REPORTED IMMEDIATELY: *FEVER GREATER THAN 100.4 F (38 C) OR HIGHER *CHILLS OR SWEATING *NAUSEA AND VOMITING THAT IS NOT CONTROLLED WITH YOUR NAUSEA MEDICATION *UNUSUAL SHORTNESS OF BREATH *UNUSUAL BRUISING OR BLEEDING *URINARY PROBLEMS (pain or burning when urinating, or frequent urination) *BOWEL PROBLEMS (unusual diarrhea, constipation, pain near the anus) TENDERNESS IN MOUTH AND THROAT WITH OR WITHOUT PRESENCE OF ULCERS (sore throat, sores in mouth, or a toothache) UNUSUAL RASH, SWELLING OR PAIN  UNUSUAL VAGINAL DISCHARGE OR ITCHING   Items with * indicate a potential emergency and should be followed up as soon as possible or go to the Emergency Department if any problems should occur.  Please show the CHEMOTHERAPY ALERT CARD or IMMUNOTHERAPY ALERT CARD at check-in to the Emergency Department and triage  nurse.  Should you have questions after your visit or need to cancel or reschedule your appointment, please contact [facility] [phone]  and follow the prompts.  Office hours are 8:00 a.m. to 4:30 p.m. Monday - Friday. Please note that voicemails left after 4:00 p.m. may not be returned until the following business day.  We are closed weekends and major holidays. You have access to a nurse at all times for urgent questions. Please call the main number to the clinic [phone] and follow the prompts.  For any non-urgent questions, you may also contact your provider using MyChart. We now offer e-Visits for anyone 67 and older to request care online for non-urgent symptoms. For details visit [email or web address].   Also download the MyChart app! Go to the app store, search "MyChart", open the app, select [facility], and log in with your MyChart username and password.  Masks are optional in the cancer centers. If you would like for your care team to wear a mask while they are taking care of you, please let them know. You may have one support person who is at least 74 years old accompany you for your appointments.

## 2022-10-28 NOTE — Progress Notes (Signed)
Patient tolerated injection with no complaints voiced. Site clean and dry with no bruising or swelling noted at site. See MAR for details. Band aid applied.  Patient stable during and after injection. VSS with discharge and left in satisfactory condition with no s/s of distress noted.  

## 2022-10-30 ENCOUNTER — Other Ambulatory Visit: Payer: Self-pay

## 2022-10-30 DIAGNOSIS — I10 Essential (primary) hypertension: Secondary | ICD-10-CM

## 2022-10-30 DIAGNOSIS — G4700 Insomnia, unspecified: Secondary | ICD-10-CM

## 2022-10-30 MED ORDER — POMALIDOMIDE 2 MG PO CAPS: ORAL_CAPSULE | ORAL | 0 refills | Status: DC

## 2022-10-30 NOTE — Telephone Encounter (Signed)
Chart reviewed. Pomalyst refilled per last office note with Dr. Katragadda.  

## 2022-11-06 ENCOUNTER — Encounter (HOSPITAL_COMMUNITY): Payer: Self-pay | Admitting: Hematology

## 2022-11-06 ENCOUNTER — Encounter: Payer: Self-pay | Admitting: Hematology

## 2022-11-06 ENCOUNTER — Other Ambulatory Visit (HOSPITAL_COMMUNITY): Payer: Self-pay

## 2022-11-06 ENCOUNTER — Telehealth: Payer: Self-pay

## 2022-11-06 NOTE — Telephone Encounter (Signed)
Oral Oncology Patient Advocate Encounter  Was successful in securing patient a $12,000 grant from [facility] to provide copayment coverage for Pomalyst.  This will keep the out of pocket expense at $0.     [employer] ID: [id number]  I have spoken with the patient.   The billing information is as follows and has been shared with [facility].    RxBin: [id number] PCN: [id number] Member ID: [id number] Group ID: [id number] Dates of Eligibility: 10.11.23 through 10.10.24  Fund:  Multiple Myeloma - Medicare [place], [facility] Patient [facility]  [phone] (phone) [phone] (fax) 11/06/2022 1:27 PM

## 2022-11-16 DIAGNOSIS — Z9484 Stem cells transplant status: Secondary | ICD-10-CM | POA: Diagnosis not present

## 2022-11-16 DIAGNOSIS — J320 Chronic maxillary sinusitis: Secondary | ICD-10-CM | POA: Diagnosis not present

## 2022-11-16 DIAGNOSIS — C9000 Multiple myeloma not having achieved remission: Secondary | ICD-10-CM | POA: Diagnosis not present

## 2022-11-16 DIAGNOSIS — J3489 Other specified disorders of nose and nasal sinuses: Secondary | ICD-10-CM | POA: Diagnosis not present

## 2022-11-23 ENCOUNTER — Other Ambulatory Visit: Payer: Self-pay

## 2022-11-23 DIAGNOSIS — C9000 Multiple myeloma not having achieved remission: Secondary | ICD-10-CM

## 2022-11-24 ENCOUNTER — Inpatient Hospital Stay: Payer: Medicare Other

## 2022-11-24 VITALS — BP 147/77 | HR 58 | Temp 98.1°F | Resp 18

## 2022-11-24 DIAGNOSIS — C9000 Multiple myeloma not having achieved remission: Secondary | ICD-10-CM

## 2022-11-24 DIAGNOSIS — Z79899 Other long term (current) drug therapy: Secondary | ICD-10-CM | POA: Diagnosis not present

## 2022-11-24 LAB — CBC WITH DIFFERENTIAL/PLATELET
Abs Immature Granulocytes: 0.01 10*3/uL (ref 0.00–0.07)
Basophils Absolute: 0 10*3/uL (ref 0.0–0.1)
Basophils Relative: 1 %
Eosinophils Absolute: 0.2 10*3/uL (ref 0.0–0.5)
Eosinophils Relative: 5 %
HCT: 31.4 % — ABNORMAL LOW (ref 39.0–52.0)
Hemoglobin: 10.4 g/dL — ABNORMAL LOW (ref 13.0–17.0)
Immature Granulocytes: 0 %
Lymphocytes Relative: 22 %
Lymphs Abs: 0.8 10*3/uL (ref 0.7–4.0)
MCH: 34.7 pg — ABNORMAL HIGH (ref 26.0–34.0)
MCHC: 33.1 g/dL (ref 30.0–36.0)
MCV: 104.7 fL — ABNORMAL HIGH (ref 80.0–100.0)
Monocytes Absolute: 1 10*3/uL (ref 0.1–1.0)
Monocytes Relative: 27 %
Neutro Abs: 1.7 10*3/uL (ref 1.7–7.7)
Neutrophils Relative %: 45 %
Platelets: 163 10*3/uL (ref 150–400)
RBC: 3 MIL/uL — ABNORMAL LOW (ref 4.22–5.81)
RDW: 15.8 % — ABNORMAL HIGH (ref 11.5–15.5)
WBC: 3.6 10*3/uL — ABNORMAL LOW (ref 4.0–10.5)
nRBC: 0 % (ref 0.0–0.2)

## 2022-11-24 LAB — MAGNESIUM: Magnesium: 2 mg/dL (ref 1.7–2.4)

## 2022-11-24 LAB — COMPREHENSIVE METABOLIC PANEL
ALT: 64 U/L — ABNORMAL HIGH (ref 0–44)
AST: 29 U/L (ref 15–41)
Albumin: 3.1 g/dL — ABNORMAL LOW (ref 3.5–5.0)
Alkaline Phosphatase: 30 U/L — ABNORMAL LOW (ref 38–126)
Anion gap: 4 — ABNORMAL LOW (ref 5–15)
BUN: 18 mg/dL (ref 8–23)
CO2: 24 mmol/L (ref 22–32)
Calcium: 7.8 mg/dL — ABNORMAL LOW (ref 8.9–10.3)
Chloride: 108 mmol/L (ref 98–111)
Creatinine, Ser: 1.24 mg/dL (ref 0.61–1.24)
GFR, Estimated: >60 mL/min (ref >60)
Glucose, Bld: 89 mg/dL (ref 70–99)
Potassium: 4 mmol/L (ref 3.5–5.1)
Sodium: 136 mmol/L (ref 135–145)
Total Bilirubin: 0.7 mg/dL (ref 0.3–1.2)
Total Protein: 4.9 g/dL — ABNORMAL LOW (ref 6.5–8.1)

## 2022-11-24 NOTE — Progress Notes (Signed)
Patient presents today for Daratumumab. Patient reports having a sinus infection and is on antibiotics, MD made aware. Per Dr. Delton Coombes push appointment out 1 weeks, labs today okay for injection next week.

## 2022-11-25 ENCOUNTER — Other Ambulatory Visit: Payer: Self-pay

## 2022-11-25 ENCOUNTER — Inpatient Hospital Stay: Payer: Medicare Other

## 2022-11-25 DIAGNOSIS — G4700 Insomnia, unspecified: Secondary | ICD-10-CM

## 2022-11-25 DIAGNOSIS — I10 Essential (primary) hypertension: Secondary | ICD-10-CM

## 2022-11-25 LAB — KAPPA/LAMBDA LIGHT CHAINS
Kappa free light chain: 2.6 mg/L — ABNORMAL LOW (ref 3.3–19.4)
Kappa, lambda light chain ratio: 0.67 (ref 0.26–1.65)
Lambda free light chains: 3.9 mg/L — ABNORMAL LOW (ref 5.7–26.3)

## 2022-11-25 MED ORDER — POMALIDOMIDE 2 MG PO CAPS: ORAL_CAPSULE | ORAL | 0 refills | Status: DC

## 2022-11-25 NOTE — Telephone Encounter (Signed)
Chart reviewed. Pomalyst refilled per last office note with Dr. Katragadda.  

## 2022-11-26 LAB — PROTEIN ELECTROPHORESIS, SERUM
A/G Ratio: 1.8 — ABNORMAL HIGH (ref 0.7–1.7)
Albumin ELP: 3 g/dL (ref 2.9–4.4)
Alpha-1-Globulin: 0.3 g/dL (ref 0.0–0.4)
Alpha-2-Globulin: 0.6 g/dL (ref 0.4–1.0)
Beta Globulin: 0.7 g/dL (ref 0.7–1.3)
Gamma Globulin: 0.2 g/dL — ABNORMAL LOW (ref 0.4–1.8)
Globulin, Total: 1.7 g/dL — ABNORMAL LOW (ref 2.2–3.9)
Total Protein ELP: 4.7 g/dL — ABNORMAL LOW (ref 6.0–8.5)

## 2022-11-26 LAB — MISC LABCORP TEST (SEND OUT): Labcorp test code: 82784

## 2022-11-27 LAB — IFE, DARA-SPECIFIC, SERUM
IgA: <5 mg/dL — ABNORMAL LOW (ref 61–437)
IgG (Immunoglobin G), Serum: 172 mg/dL — ABNORMAL LOW (ref 603–1613)
IgM (Immunoglobulin M), Srm: 19 mg/dL (ref 15–143)

## 2022-12-01 ENCOUNTER — Other Ambulatory Visit: Payer: Medicare Other

## 2022-12-01 ENCOUNTER — Inpatient Hospital Stay: Payer: Medicare Other | Attending: Hematology | Admitting: Hematology

## 2022-12-01 ENCOUNTER — Inpatient Hospital Stay: Payer: Medicare Other

## 2022-12-01 ENCOUNTER — Ambulatory Visit: Payer: Medicare Other | Admitting: Hematology

## 2022-12-01 VITALS — BP 140/60 | HR 58 | Temp 99.1°F | Resp 17 | Ht 69.0 in | Wt 203.7 lb

## 2022-12-01 DIAGNOSIS — Z9221 Personal history of antineoplastic chemotherapy: Secondary | ICD-10-CM | POA: Diagnosis not present

## 2022-12-01 DIAGNOSIS — C50922 Malignant neoplasm of unspecified site of left male breast: Secondary | ICD-10-CM | POA: Insufficient documentation

## 2022-12-01 DIAGNOSIS — Z949 Transplanted organ and tissue status, unspecified: Secondary | ICD-10-CM | POA: Diagnosis not present

## 2022-12-01 DIAGNOSIS — Z79810 Long term (current) use of selective estrogen receptor modulators (SERMs): Secondary | ICD-10-CM | POA: Diagnosis not present

## 2022-12-01 DIAGNOSIS — C9000 Multiple myeloma not having achieved remission: Secondary | ICD-10-CM | POA: Diagnosis not present

## 2022-12-01 DIAGNOSIS — K219 Gastro-esophageal reflux disease without esophagitis: Secondary | ICD-10-CM | POA: Diagnosis not present

## 2022-12-01 DIAGNOSIS — G4700 Insomnia, unspecified: Secondary | ICD-10-CM

## 2022-12-01 DIAGNOSIS — I129 Hypertensive chronic kidney disease with stage 1 through stage 4 chronic kidney disease, or unspecified chronic kidney disease: Secondary | ICD-10-CM | POA: Insufficient documentation

## 2022-12-01 DIAGNOSIS — I10 Essential (primary) hypertension: Secondary | ICD-10-CM | POA: Diagnosis not present

## 2022-12-01 DIAGNOSIS — Z79624 Long term (current) use of inhibitors of nucleotide synthesis: Secondary | ICD-10-CM | POA: Diagnosis not present

## 2022-12-01 DIAGNOSIS — Z923 Personal history of irradiation: Secondary | ICD-10-CM | POA: Diagnosis not present

## 2022-12-01 DIAGNOSIS — Z7951 Long term (current) use of inhaled steroids: Secondary | ICD-10-CM | POA: Insufficient documentation

## 2022-12-01 DIAGNOSIS — D801 Nonfamilial hypogammaglobulinemia: Secondary | ICD-10-CM | POA: Diagnosis not present

## 2022-12-01 DIAGNOSIS — C7951 Secondary malignant neoplasm of bone: Secondary | ICD-10-CM | POA: Insufficient documentation

## 2022-12-01 DIAGNOSIS — Z7961 Long term (current) use of immunomodulator: Secondary | ICD-10-CM | POA: Diagnosis not present

## 2022-12-01 DIAGNOSIS — Z7982 Long term (current) use of aspirin: Secondary | ICD-10-CM | POA: Diagnosis not present

## 2022-12-01 DIAGNOSIS — G629 Polyneuropathy, unspecified: Secondary | ICD-10-CM | POA: Insufficient documentation

## 2022-12-01 DIAGNOSIS — Z5112 Encounter for antineoplastic immunotherapy: Secondary | ICD-10-CM | POA: Insufficient documentation

## 2022-12-01 MED ORDER — DARATUMUMAB-HYALURONIDASE-FIHJ 1800-30000 MG-UT/15ML ~~LOC~~ SOLN
1800.0000 mg | Freq: Once | SUBCUTANEOUS | Status: AC
  Administered 2022-12-01: 1800 mg via SUBCUTANEOUS
  Filled 2022-12-01: qty 15

## 2022-12-01 MED ORDER — POMALIDOMIDE 2 MG PO CAPS: ORAL_CAPSULE | ORAL | 0 refills | Status: DC

## 2022-12-01 NOTE — Progress Notes (Signed)
Patient has been assessed, vital signs and labs have been reviewed by Dr. Katragadda. ANC, Creatinine, LFTs, and Platelets are within treatment parameters per Dr. Katragadda. The patient is good to proceed with treatment at this time. Primary RN and pharmacy aware.  

## 2022-12-01 NOTE — Progress Notes (Signed)
Johnny Lucas, [place] [address]   CLINIC:  Medical Oncology/Hematology  PCP:  Practice, [facility] / [place] [address] [phone]   REASON FOR VISIT:  Follow-up for multiple myeloma  PRIOR THERAPY:  1. RVD x 6 cycles from 03/01/2017 to 06/29/2017. 2. Stem cell transplant on 09/30/2017. 3. Carfilzomib and cyclophosphamide x 2 cycles from 02/14/2018 to 03/29/2018. 4. Carfilzomib x 22 cycles from 04/20/2018 to 01/23/2020.  NGS Results: not done  CURRENT THERAPY: Darzalex Faspro monthly; Pomalyst 2 mg 3/4 weeks; Xgeva monthly  BRIEF ONCOLOGIC HISTORY:  Oncology History  Breast cancer, male (Johnny Lucas)  12/31/2009 Initial Biopsy   Biopsy of L breast    12/31/2009 Pathology Results   Invasive ductal carcinoma, ER/PR+, HER 2 negative   12/31/2009 Imaging   Ultrasound showing a 2.43 x 1.85 x 3 cm hypoechoic spiculated mass in the 12 o clock L breast retroareolar region   01/01/2010 -  Anti-estrogen oral therapy   Tamoxifen 20 mg daily   01/06/2010 Imaging   Bone scan abnormal uptake in the diaphysis of the R humerus, abnormal in the R third, fifth and sixth ribs, lesion also noted in the sternum.   02/03/2010 Surgery   Rod placement and fixation of R humerus by Dr. [name]   02/05/2010 - 02/18/2010 Radiation Therapy   30Gy in 10 fractions of 3 Gy per fraction to R pathologic fracture   03/11/2010 -  Chemotherapy   Denosumab monthly, now every 3 months. Started at [facility]    06/09/2016 Imaging   Three hypermetabolic osseous lesions in the sternum, left ilium and right ilium, as discussed above, likely represent osseous metastases. At this time, these are not recognizable on the CT images. 2. No extra skeletal metastatic disease identified in the neck, chest, abdomen or pelvis.   10/13/2016 Progression   PET shows various new and enlarging osseous metastatic lesions with no definite extra osseous metastatic disease currently  identified.    12/31/2016 Progression   1. Multifocal hypermetabolic osseous metastases throughout the axial and proximal appendicular skeleton, which are increased in size, number and metabolism since 10/13/2016 PET-CT. 2. 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 3. No additional sites of hypermetabolic metastatic disease. 4. Chronic right mastoid sinusitis. 5. Aortic atherosclerosis.  One vessel coronary atherosclerosis.   Multiple myeloma (Johnny Lucas)  02/12/2017 Bone Marrow Biopsy   The marrow was variably cellular with large peritrabecular aggregates of kappa restricted plasma cells (66% by aspirate, 30% by Cd138). Cytogenetics +11.    03/01/2017 - 06/29/2017 Chemotherapy   RVD    05/26/2017 Bone Marrow Biopsy   Performed at [facility]:  Plasma cell myeloma in a 30% cellular marrow with decreased trilineage hematopoiesis and 42% kappa light chain restricted plasma cells on the aspirate smears and large aggregates on the core biopsy.     07/12/2017 - 09/01/2017 Chemotherapy   3 cycles of carfizolmib/cyclophosphamide/dexamethasone     10/08/2017 Bone Marrow Transplant   Autotransplant at [facility]   02/28/2020 - 09/01/2022 Chemotherapy   Patient is on Treatment Plan : Daratumumab q28d     09/01/2022 -  Chemotherapy   Patient is on Treatment Plan : MYELOMA Daratumumab Faspro Subq q28d     Multiple myeloma not having achieved remission (Johnny Lucas)  06/29/2017 Initial Diagnosis   Multiple myeloma not having achieved remission (Johnny Lucas)   02/28/2020 -  09/01/2022 Chemotherapy   Patient is on Treatment Plan : Daratumumab q28d     09/01/2022 -  Chemotherapy   Patient is on Treatment Plan : MYELOMA Daratumumab Faspro Subq q28d       CANCER STAGING:  Cancer Staging  Multiple myeloma (Johnny Lucas) Staging form: Plasma Cell Myeloma and Plasma Cell Disorders, AJCC 8th  Edition - Clinical stage from 05/03/2017: Beta-2-microglobulin (mg/L): 3.5, Albumin (g/dL): 3.4, ISS: Stage II, High-risk cytogenetics: Absent, LDH: Not assessed - Signed by Baird Cancer, PA-C on 05/03/2017   INTERVAL HISTORY:  [name], a 76 y.o. male, seen for follow-up and toxicity assessment prior to next cycle of Darzalex.  He recently developed a sinus infection and is currently on doxycycline 100 twice daily.  His symptoms have improved.  Denies any fevers or chills.  Energy levels are reported as 60%.   REVIEW OF SYSTEMS:  Review of Systems  Constitutional:  Negative for appetite change and fatigue.  Respiratory:  Negative for cough.   Cardiovascular:  Negative for leg swelling.  Skin:  Negative for rash.  Neurological:  Positive for numbness (Hands and feet stable.).  Hematological:  Does not bruise/bleed easily.  All other systems reviewed and are negative.   PAST MEDICAL/SURGICAL HISTORY:  Past Medical History:  Diagnosis Date   Anxiety    Bone metastases 09/10/2016   Breast cancer ([place]) 2011   Stage IV breast cancer; radiation and tamoxifen   Breast cancer, male ([place])    Stage IV breast cancer; radiation and tamoxifen  Overview:  Left breast ca with mets bone  Overview:  METS TO BONE   GERD (gastroesophageal reflux disease)    Hypertension    Macular degeneration    Multiple myeloma ([place]) 02/18/2017   Peripheral neuropathy    Past Surgical History:  Procedure Laterality Date   BACK SURGERY     C2-C3 fusion   BREAST BIOPSY Left    cancer   CATARACT EXTRACTION W/PHACO Left 02/03/2019   Procedure: CATARACT EXTRACTION PHACO AND INTRAOCULAR LENS PLACEMENT ([place]);  Surgeon: Baruch Goldmann, MD;  Location: [facility];  Service: Ophthalmology;  Laterality: Left;  CDE: 15.89   CATARACT EXTRACTION W/PHACO Right 07/25/2021   Procedure: CATARACT EXTRACTION PHACO AND INTRAOCULAR LENS PLACEMENT ([facility]);  Surgeon: Baruch Goldmann, MD;  Location: [facility];  Service: Ophthalmology;   Laterality: Right;  CDE   26.71   HERNIA REPAIR     PORTACATH PLACEMENT Left 06/10/2018   Procedure: INSERTION PORT-A-CATH;  Surgeon: Virl Cagey, MD;  Location: [facility];  Service: General;  Laterality: Left;   right arm surgery     bone cancer and rod placed    SOCIAL HISTORY:  Social History   Socioeconomic History   Marital status: Married    Spouse name: Not on file   Number of children: 3   Years of education: Not on file   Highest education level: Not on file  Occupational History   Not on file  Tobacco Use   Smoking status: Never   Smokeless tobacco: Former  Scientific laboratory technician Use: Never used  Substance and Sexual Activity   Alcohol use: Yes    Alcohol/week: 24.0 standard drinks of alcohol    Types: 24 Cans of beer per week   Drug use: No   Sexual activity: Yes  Other Topics Concern   Not on file  Social History Narrative   Not on file   Social Determinants of Health   Financial Resource Strain:  Low Risk  (11/26/2020)   Overall Financial Resource Strain (CARDIA)    Difficulty of Paying Living Expenses: Not hard at all  Food Insecurity: No Food Insecurity (11/26/2020)   Hunger Vital Sign    Worried About Running Out of Food in the Last Year: Never true    Ran Out of Food in the Last Year: Never true  Transportation Needs: No Transportation Needs (11/26/2020)   PRAPARE - Hydrologist (Medical): No    Lack of Transportation (Non-Medical): No  Physical Activity: Inactive (11/26/2020)   Exercise Vital Sign    Days of Exercise per Week: 0 days    Minutes of Exercise per Session: 0 min  Stress: Stress Concern Present (11/26/2020)   [place]    Feeling of Stress : To some extent  Social Connections: Moderately Integrated (11/26/2020)   Social Connection and Isolation Panel [NHANES]    Frequency of Communication with Friends and Family: More than three times a  week    Frequency of Social Gatherings with Friends and Family: Three times a week    Attends Religious Services: 1 to 4 times per year    Active Member of Clubs or Organizations: No    Attends Archivist Meetings: Never    Marital Status: Married  Human resources officer Violence: Not At Risk (11/26/2020)   Humiliation, Afraid, Rape, and Kick questionnaire    Fear of Current or Ex-Partner: No    Emotionally Abused: No    Physically Abused: No    Sexually Abused: No    FAMILY HISTORY:  Family History  Problem Relation Age of Onset   Stroke Mother    Cancer Maternal Aunt        cancer NOS; died in her 38s   Lung cancer Maternal Uncle        smoker    CURRENT MEDICATIONS:  Current Outpatient Medications  Medication Sig Dispense Refill   acetaminophen (TYLENOL) 500 MG tablet Take 1,000 mg by mouth every 6 (six) hours as needed for moderate pain.     acyclovir (ZOVIRAX) 800 MG tablet Take 1 tablet (800 mg total) by mouth 2 (two) times daily. 180 tablet 3   albuterol (VENTOLIN HFA) 108 (90 Base) MCG/ACT inhaler Inhale 2 puffs into the lungs every 6 (six) hours as needed for wheezing or shortness of breath.     ALPRAZolam (XANAX) 0.5 MG tablet TAKE 1 TABLET BY MOUTH AT BEDTIME AS NEEDED FOR anxiety / SLEEP 30 tablet 5   amoxicillin-clavulanate (AUGMENTIN) 875-125 MG tablet Take 1 tablet by mouth 2 (two) times daily.     aspirin EC 81 MG tablet Take 81 mg by mouth daily.      Azelastine HCl 137 MCG/SPRAY SOLN Place 1 spray into both nostrils daily.     benzonatate (TESSALON) 100 MG capsule Take 100 mg by mouth 3 (three) times daily as needed.     budesonide (PULMICORT) 0.5 MG/2ML nebulizer solution Add 2 mL (1 vial) of budesonide into 240 mL saline irrigation bottle and irrigate both nostrils morning and night (one bottle total per day).     denosumab (XGEVA) 120 MG/1.7ML SOLN injection Inject 120 mg into the skin every 30 (thirty) days.      dexamethasone (DECADRON) 4 MG tablet TAKE  FIVE TABLETS BY MOUTH WEEKLY 40 tablet 5   diphenhydrAMINE (BENADRYL) 25 MG tablet Take 25 mg by mouth daily as needed for allergies.  doxycycline (VIBRA-TABS) 100 MG tablet Take 100 mg by mouth 2 (two) times daily.     FLOVENT HFA 110 MCG/ACT inhaler 1 puff 2 (two) times daily.     folic acid (FOLVITE) 1 MG tablet TAKE 1 TABLET BY MOUTH DAILY 90 tablet 2   gabapentin (NEURONTIN) 300 MG capsule TAKE ONE CAPSULE BY MOUTH THREE TIMES DAILY 180 capsule 2   Loperamide HCl (IMODIUM PO) Take by mouth as needed.     loratadine (CLARITIN) 10 MG tablet Take 1 tablet (10 mg total) by mouth daily. 90 tablet 6   metoprolol succinate (TOPROL-XL) 100 MG 24 hr tablet Take 100 mg by mouth daily.     Multiple Vitamin (MULTIVITAMIN WITH MINERALS) TABS tablet Take 1 tablet by mouth daily.      ondansetron (ZOFRAN) 8 MG tablet TAKE 1 TABLET BY MOUTH EVERY 8 HOURS AS NEEDED FOR NAUSEA AND VOMITING 30 tablet 2   pantoprazole (PROTONIX) 40 MG tablet TAKE 1 TABLET EVERY OTHER DAY 45 tablet 6   polyethylene glycol (MIRALAX / GLYCOLAX) packet Take 17 g by mouth daily as needed for mild constipation. 14 each 0   pomalidomide (POMALYST) 2 MG capsule TAKE 1 CAPSULE BY MOUTH EVERY DAY FOR 21 DAYS ON FOLLOWED BY 7 DAYS OFF 21 capsule 0   predniSONE (DELTASONE) 10 MG tablet Take by mouth.     sildenafil (VIAGRA) 100 MG tablet Take by mouth.     tamoxifen (NOLVADEX) 20 MG tablet TAKE 1 TABLET EVERY DAY 90 tablet 3   No current facility-administered medications for this visit.   Facility-Administered Medications Ordered in Other Visits  Medication Dose Route Frequency Provider Last Rate Last Admin   ondansetron (ZOFRAN) 8 mg in sodium chloride 0.9 % 50 mL IVPB  8 mg Intravenous Once Lockamy, Randi L, NP-C       sodium chloride flush (NS) 0.9 % injection 10 mL  10 mL Intracatheter Once PRN Derek Jack, MD        ALLERGIES:  Allergies  Allergen Reactions   Cefepime     Suspected severe thrombocytopenia is a  result of cefepime induced antigen platelet destruction    PHYSICAL EXAM:  Performance status (ECOG): 1 - Symptomatic but completely ambulatory  There were no vitals filed for this visit.  Wt Readings from Last 3 Encounters:  12/01/22 203 lb 11.2 oz (92.4 kg)  09/30/22 207 lb (93.9 kg)  09/01/22 205 lb (93 kg)   Physical Exam Vitals reviewed.  Constitutional:      Appearance: Normal appearance. He is obese.  Cardiovascular:     Rate and Rhythm: Normal rate and regular rhythm.     Pulses: Normal pulses.     Heart sounds: Normal heart sounds.  Pulmonary:     Effort: Pulmonary effort is normal.     Breath sounds: Normal breath sounds.  Musculoskeletal:     Right lower leg: No edema.     Left lower leg: No edema.  Neurological:     General: No focal deficit present.     Mental Status: He is alert and oriented to person, place, and time.  Psychiatric:        Mood and Affect: Mood normal.        Behavior: Behavior normal.    LABORATORY DATA:  I have reviewed the labs as listed.     Latest Ref Rng & Units 11/24/2022   10:24 AM 10/27/2022    9:03 AM 09/29/2022   12:07 PM  CBC  WBC 4.0 - 10.5 K/uL 3.6  3.3  3.5   Hemoglobin 13.0 - 17.0 g/dL 10.4  10.6  9.7   Hematocrit 39.0 - 52.0 % 31.4  32.2  30.1   Platelets 150 - 400 K/uL 163  162  166       Latest Ref Rng & Units 11/24/2022   10:24 AM 10/27/2022   11:00 AM 08/25/2022   11:09 AM  CMP  Glucose 70 - 99 mg/dL 89  97  102   BUN 8 - 23 mg/dL _0 Creatinine 0.61 - 1.24 mg/dL 1.24  1.40  1.24   Sodium 135 - 145 mmol/L 136  136  138   Potassium 3.5 - 5.1 mmol/L 4.0  4.8  4.5   Chloride 98 - 111 mmol/L 108  107  110   CO2 22 - 32 mmol/L _1 Calcium 8.9 - 10.3 mg/dL 7.8  8.5  8.0   Total Protein 6.5 - 8.1 g/dL 4.9  5.0  5.7   Total Bilirubin 0.3 - 1.2 mg/dL 0.7  0.6  0.6   Alkaline Phos 38 - 126 U/L 30  33  35   AST 15 - 41 U/L _2 ALT 0 - 44 U/L 64  14  15     DIAGNOSTIC IMAGING:  I  have independently reviewed the scans and discussed with the patient. No results found.   ASSESSMENT:  1.  Relapsed multiple myeloma: -Autologous stem cell transplant on 10/08/2017 -Post transplant consolidation with CyCarD for 2 cycles with M spike undetectable. -Maintenance therapy with carfilzomib 70 mg per metered square days 1 and 15 every 28 days from 04/20/2018, dose reduced to 35 mg per metered square on 01/05/2019, titrated up to 56 mg per metered square on 01/09/2020. 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 -PET scan on 02/18/2020 showed new left supraclavicular lymph node at the thoracic inlet, SUV 14.2.  Multifocal osseous lesions largely improved, though residual lesions noted including right acromion, left acromion, thyroid cartilage, right sternum.  Additional lesions throughout the sternum, bilateral ribs, thoracolumbar spine, bilateral pelvis have resolved. -BMBX on 02/14/2020 shows plasma cell myeloma, 60-70% of cells.  FISH for myeloma was negative.  Cytogenetics was normal. -4 cycles of daratumumab, bortezomib and dexamethasone from 02/28/2020 through 04/29/2020. -Daratumumab, pomalidomide and dexamethasone started on 05/28/2020.  He is receiving daratumumab every 2 weeks, Pomalyst 3 mg days 1-21, dexamethasone 20 mg weekly. -Pomalidomide dose reduced to 2 mg days 1-21 around the first week of July 2021, due to severe weakness.   PLAN:  1.  Relapsed IgG kappa multiple myeloma: - He is tolerating pomalidomide and monthly Darzalex reasonably well. - At last week his Darzalex was held because of sinus infection.  He is finishing up doxycycline.  His symptoms are better. - Reviewed labs from 11/24/2022.  M spike is not seen.  Free light chain ratio is 0.67 with kappa light chains 2.6 and lambda light chains 3.9. - Dara specific immunofixation shows IgM lambda.  His original myeloma protein is IgG kappa. - He will continue pomalidomide 2 mg 3  weeks on/1 week off.  He will continue Darzalex once a month.  RTC 3 months for follow-up with repeat myeloma panel.   2.  Myeloma bone disease: - Continue denosumab monthly.  Continue calcium supplements.   3.  Macrocytic anemia: - Hemoglobin is 10.4 with MCV 104.  Last ferritin was 170 and percent saturation 24.  A83 and folic acid is normal. - Anemia from chronic inflammation from CKD.  Will plan on checking ferritin and iron panel at next visit in 3 months.  Will consider ESA if hemoglobin drops below 9.   4.  ID prophylaxis: - Continue acyclovir twice daily.   5.  Peripheral neuropathy: - Continue gabapentin 600 mg 3 times daily.  Numbness is stable.   6.  History of breast cancer: - Continue tamoxifen daily.  7.  Hypogammaglobulinemia: - Last IgG level was 172.  He had 2 sinus infections so far this year. - If there is increased frequency of infections, consider IVIG.   Orders placed this encounter:  No orders of the defined types were placed in this encounter.    Derek Jack, MD [place] [phone]

## 2022-12-01 NOTE — Patient Instructions (Addendum)
[facility]  Discharge Instructions  You were seen and examined today by Dr. Delton Coombes.  Your labs look good. Proceed with treatment today.   For your Pomalyst refill, please call [phone] to schedule refill through [facility].  Follow-up as scheduled.  Thank you for choosing [facility] to provide your oncology and hematology care.   To afford each patient quality time with our provider, please arrive at least 15 minutes before your scheduled appointment time. You may need to reschedule your appointment if you arrive late (10 or more minutes). Arriving late affects you and other patients whose appointments are after yours.  Also, if you miss three or more appointments without notifying the office, you may be dismissed from the clinic at the provider's discretion.    Again, thank you for choosing [facility].  Our hope is that these requests will decrease the amount of time that you wait before being seen by our physicians.   If you have a lab appointment with the [facility] please come in thru the Main Entrance and check in at the main information desk.           _____________________________________________________________  Should you have questions after your visit to [facility], please contact our office at [phone] and follow the prompts.  Our office hours are 8:00 a.m. to 4:30 p.m. Monday - Thursday and 8:00 a.m. to 2:30 p.m. Friday.  Please note that voicemails left after 4:00 p.m. may not be returned until the following business day.  We are closed weekends and all major holidays.  You do have access to a nurse 24-7, just call the main number to the clinic [phone] and do not press any options, hold on the line and a nurse will answer the phone.    For prescription refill requests, have your pharmacy contact our office and allow 72 hours.    Masks are optional in the cancer  centers. If you would like for your care team to wear a mask while they are taking care of you, please let them know. You may have one support person who is at least 74 years old accompany you for your appointments.

## 2022-12-01 NOTE — Progress Notes (Signed)
Patient and labs assessed by Dr. Delton Coombes, patient is okay for treatment. Patient reports taking pre-medications at home. Patient tolerated Daratumumab injection with no complaints voiced. See MAR for details. Lab reviewed. Injection site clean and dry with no bruising or swelling noted at site. Band aid applied. Vss with discharge and left in satisfactory condition with nos/s of distress noted.

## 2022-12-01 NOTE — Patient Instructions (Signed)
[facility]  Discharge Instructions: Thank you for choosing [facility] to provide your oncology and hematology care.  If you have a lab appointment with the Cancer Center, please come in thru the Main Entrance and check in at the main information desk.  Wear comfortable clothing and clothing appropriate for easy access to any Portacath or PICC line.   We strive to give you quality time with your provider. You may need to reschedule your appointment if you arrive late (15 or more minutes).  Arriving late affects you and other patients whose appointments are after yours.  Also, if you miss three or more appointments without notifying the office, you may be dismissed from the clinic at the provider's discretion.      For prescription refill requests, have your pharmacy contact our office and allow 72 hours for refills to be completed.    Today you received the following chemotherapy and/or immunotherapy agents Daratumumab, return as scheduled.   To help prevent nausea and vomiting after your treatment, we encourage you to take your nausea medication as directed.  BELOW ARE SYMPTOMS THAT SHOULD BE REPORTED IMMEDIATELY: *FEVER GREATER THAN 100.4 F (38 C) OR HIGHER *CHILLS OR SWEATING *NAUSEA AND VOMITING THAT IS NOT CONTROLLED WITH YOUR NAUSEA MEDICATION *UNUSUAL SHORTNESS OF BREATH *UNUSUAL BRUISING OR BLEEDING *URINARY PROBLEMS (pain or burning when urinating, or frequent urination) *BOWEL PROBLEMS (unusual diarrhea, constipation, pain near the anus) TENDERNESS IN MOUTH AND THROAT WITH OR WITHOUT PRESENCE OF ULCERS (sore throat, sores in mouth, or a toothache) UNUSUAL RASH, SWELLING OR PAIN  UNUSUAL VAGINAL DISCHARGE OR ITCHING   Items with * indicate a potential emergency and should be followed up as soon as possible or go to the Emergency Department if any problems should occur.  Please show the CHEMOTHERAPY ALERT CARD or IMMUNOTHERAPY ALERT CARD at  check-in to the Emergency Department and triage nurse.  Should you have questions after your visit or need to cancel or reschedule your appointment, please contact [facility] [phone]  and follow the prompts.  Office hours are 8:00 a.m. to 4:30 p.m. Monday - Friday. Please note that voicemails left after 4:00 p.m. may not be returned until the following business day.  We are closed weekends and major holidays. You have access to a nurse at all times for urgent questions. Please call the main number to the clinic [phone] and follow the prompts.  For any non-urgent questions, you may also contact your provider using MyChart. We now offer e-Visits for anyone 18 and older to request care online for non-urgent symptoms. For details visit [email or web address].   Also download the MyChart app! Go to the app store, search "MyChart", open the app, select [facility], and log in with your MyChart username and password.  Masks are optional in the cancer centers. If you would like for your care team to wear a mask while they are taking care of you, please let them know. You may have one support person who is at least 74 years old accompany you for your appointments.  

## 2022-12-02 ENCOUNTER — Inpatient Hospital Stay: Payer: Medicare Other

## 2022-12-02 ENCOUNTER — Other Ambulatory Visit: Payer: Self-pay

## 2022-12-02 VITALS — BP 130/73 | HR 59 | Temp 98.7°F | Resp 18

## 2022-12-02 DIAGNOSIS — K219 Gastro-esophageal reflux disease without esophagitis: Secondary | ICD-10-CM | POA: Diagnosis not present

## 2022-12-02 DIAGNOSIS — D801 Nonfamilial hypogammaglobulinemia: Secondary | ICD-10-CM | POA: Diagnosis not present

## 2022-12-02 DIAGNOSIS — Z5112 Encounter for antineoplastic immunotherapy: Secondary | ICD-10-CM | POA: Diagnosis not present

## 2022-12-02 DIAGNOSIS — C9000 Multiple myeloma not having achieved remission: Secondary | ICD-10-CM | POA: Diagnosis not present

## 2022-12-02 DIAGNOSIS — C50922 Malignant neoplasm of unspecified site of left male breast: Secondary | ICD-10-CM | POA: Diagnosis not present

## 2022-12-02 DIAGNOSIS — C7951 Secondary malignant neoplasm of bone: Secondary | ICD-10-CM | POA: Diagnosis not present

## 2022-12-02 MED ORDER — DENOSUMAB 120 MG/1.7ML ~~LOC~~ SOLN
120.0000 mg | Freq: Once | SUBCUTANEOUS | Status: AC
  Administered 2022-12-02: 120 mg via SUBCUTANEOUS
  Filled 2022-12-02: qty 1.7

## 2022-12-02 NOTE — Progress Notes (Signed)
Patient tolerated Xgeva injection with no complaints voiced.  Site clean and dry with no bruising or swelling noted.  No complaints of pain.  Discharged with vital signs stable and no signs or symptoms of distress noted.  

## 2022-12-02 NOTE — Patient Instructions (Signed)
[facility]  Discharge Instructions: Thank you for choosing [facility] to provide your oncology and hematology care.  If you have a lab appointment with the Cancer Center, please come in thru the Main Entrance and check in at the main information desk.  Wear comfortable clothing and clothing appropriate for easy access to any Portacath or PICC line.   We strive to give you quality time with your provider. You may need to reschedule your appointment if you arrive late (15 or more minutes).  Arriving late affects you and other patients whose appointments are after yours.  Also, if you miss three or more appointments without notifying the office, you may be dismissed from the clinic at the provider's discretion.      For prescription refill requests, have your pharmacy contact our office and allow 72 hours for refills to be completed.     To help prevent nausea and vomiting after your treatment, we encourage you to take your nausea medication as directed.  BELOW ARE SYMPTOMS THAT SHOULD BE REPORTED IMMEDIATELY: *FEVER GREATER THAN 100.4 F (38 C) OR HIGHER *CHILLS OR SWEATING *NAUSEA AND VOMITING THAT IS NOT CONTROLLED WITH YOUR NAUSEA MEDICATION *UNUSUAL SHORTNESS OF BREATH *UNUSUAL BRUISING OR BLEEDING *URINARY PROBLEMS (pain or burning when urinating, or frequent urination) *BOWEL PROBLEMS (unusual diarrhea, constipation, pain near the anus) TENDERNESS IN MOUTH AND THROAT WITH OR WITHOUT PRESENCE OF ULCERS (sore throat, sores in mouth, or a toothache) UNUSUAL RASH, SWELLING OR PAIN  UNUSUAL VAGINAL DISCHARGE OR ITCHING   Items with * indicate a potential emergency and should be followed up as soon as possible or go to the Emergency Department if any problems should occur.  Please show the CHEMOTHERAPY ALERT CARD or IMMUNOTHERAPY ALERT CARD at check-in to the Emergency Department and triage nurse.  Should you have questions after your visit or need to  cancel or reschedule your appointment, please contact [facility] [phone]  and follow the prompts.  Office hours are 8:00 a.m. to 4:30 p.m. Monday - Friday. Please note that voicemails left after 4:00 p.m. may not be returned until the following business day.  We are closed weekends and major holidays. You have access to a nurse at all times for urgent questions. Please call the main number to the clinic [phone] and follow the prompts.  For any non-urgent questions, you may also contact your provider using MyChart. We now offer e-Visits for anyone 18 and older to request care online for non-urgent symptoms. For details visit [email or web address].   Also download the MyChart app! Go to the app store, search "MyChart", open the app, select [facility], and log in with your MyChart username and password.  Masks are optional in the cancer centers. If you would like for your care team to wear a mask while they are taking care of you, please let them know. You may have one support person who is at least 74 years old accompany you for your appointments.  

## 2022-12-22 ENCOUNTER — Ambulatory Visit: Payer: Medicare Other

## 2022-12-22 ENCOUNTER — Other Ambulatory Visit: Payer: Medicare Other

## 2022-12-23 ENCOUNTER — Ambulatory Visit: Payer: Medicare Other

## 2022-12-25 ENCOUNTER — Other Ambulatory Visit: Payer: Self-pay

## 2022-12-25 DIAGNOSIS — I10 Essential (primary) hypertension: Secondary | ICD-10-CM

## 2022-12-25 DIAGNOSIS — G4700 Insomnia, unspecified: Secondary | ICD-10-CM

## 2022-12-25 MED ORDER — POMALIDOMIDE 2 MG PO CAPS: ORAL_CAPSULE | ORAL | 0 refills | Status: DC

## 2022-12-25 NOTE — Telephone Encounter (Signed)
Chart reviewed. Pomalyst refilled per last office note with Dr. Katragadda.  

## 2022-12-29 ENCOUNTER — Inpatient Hospital Stay: Payer: Medicare Other | Attending: Hematology

## 2022-12-29 ENCOUNTER — Inpatient Hospital Stay: Payer: Medicare Other

## 2022-12-29 ENCOUNTER — Encounter (HOSPITAL_COMMUNITY): Payer: Self-pay | Admitting: Hematology

## 2022-12-29 VITALS — BP 140/74 | HR 63 | Temp 98.2°F | Resp 16 | Wt 208.8 lb

## 2022-12-29 DIAGNOSIS — Z95828 Presence of other vascular implants and grafts: Secondary | ICD-10-CM

## 2022-12-29 DIAGNOSIS — Z5112 Encounter for antineoplastic immunotherapy: Secondary | ICD-10-CM | POA: Diagnosis not present

## 2022-12-29 DIAGNOSIS — D702 Other drug-induced agranulocytosis: Secondary | ICD-10-CM

## 2022-12-29 DIAGNOSIS — Z79899 Other long term (current) drug therapy: Secondary | ICD-10-CM | POA: Insufficient documentation

## 2022-12-29 DIAGNOSIS — Z9484 Stem cells transplant status: Secondary | ICD-10-CM | POA: Insufficient documentation

## 2022-12-29 DIAGNOSIS — N189 Chronic kidney disease, unspecified: Secondary | ICD-10-CM | POA: Insufficient documentation

## 2022-12-29 DIAGNOSIS — D631 Anemia in chronic kidney disease: Secondary | ICD-10-CM | POA: Insufficient documentation

## 2022-12-29 DIAGNOSIS — D801 Nonfamilial hypogammaglobulinemia: Secondary | ICD-10-CM | POA: Insufficient documentation

## 2022-12-29 DIAGNOSIS — C9000 Multiple myeloma not having achieved remission: Secondary | ICD-10-CM

## 2022-12-29 DIAGNOSIS — C50822 Malignant neoplasm of overlapping sites of left male breast: Secondary | ICD-10-CM | POA: Insufficient documentation

## 2022-12-29 DIAGNOSIS — C9002 Multiple myeloma in relapse: Secondary | ICD-10-CM | POA: Diagnosis not present

## 2022-12-29 DIAGNOSIS — Z170 Estrogen receptor positive status [ER+]: Secondary | ICD-10-CM | POA: Diagnosis not present

## 2022-12-29 DIAGNOSIS — G629 Polyneuropathy, unspecified: Secondary | ICD-10-CM | POA: Insufficient documentation

## 2022-12-29 DIAGNOSIS — Z79810 Long term (current) use of selective estrogen receptor modulators (SERMs): Secondary | ICD-10-CM | POA: Insufficient documentation

## 2022-12-29 LAB — CBC WITH DIFFERENTIAL/PLATELET
Abs Immature Granulocytes: 0 10*3/uL (ref 0.00–0.07)
Basophils Absolute: 0.1 10*3/uL (ref 0.0–0.1)
Basophils Relative: 2 %
Eosinophils Absolute: 0.3 10*3/uL (ref 0.0–0.5)
Eosinophils Relative: 14 %
HCT: 31.5 % — ABNORMAL LOW (ref 39.0–52.0)
Hemoglobin: 10.2 g/dL — ABNORMAL LOW (ref 13.0–17.0)
Immature Granulocytes: 0 %
Lymphocytes Relative: 37 %
Lymphs Abs: 0.9 10*3/uL (ref 0.7–4.0)
MCH: 33.6 pg (ref 26.0–34.0)
MCHC: 32.4 g/dL (ref 30.0–36.0)
MCV: 103.6 fL — ABNORMAL HIGH (ref 80.0–100.0)
Monocytes Absolute: 0.6 10*3/uL (ref 0.1–1.0)
Monocytes Relative: 24 %
Neutro Abs: 0.6 10*3/uL — ABNORMAL LOW (ref 1.7–7.7)
Neutrophils Relative %: 23 %
Platelets: 134 10*3/uL — ABNORMAL LOW (ref 150–400)
RBC: 3.04 MIL/uL — ABNORMAL LOW (ref 4.22–5.81)
RDW: 15.8 % — ABNORMAL HIGH (ref 11.5–15.5)
Smear Review: DECREASED
WBC: 2.5 10*3/uL — ABNORMAL LOW (ref 4.0–10.5)
nRBC: 0 % (ref 0.0–0.2)

## 2022-12-29 LAB — COMPREHENSIVE METABOLIC PANEL
ALT: 11 U/L (ref 0–44)
AST: 16 U/L (ref 15–41)
Albumin: 3.1 g/dL — ABNORMAL LOW (ref 3.5–5.0)
Alkaline Phosphatase: 32 U/L — ABNORMAL LOW (ref 38–126)
Anion gap: 6 (ref 5–15)
BUN: 15 mg/dL (ref 8–23)
CO2: 24 mmol/L (ref 22–32)
Calcium: 8.2 mg/dL — ABNORMAL LOW (ref 8.9–10.3)
Chloride: 106 mmol/L (ref 98–111)
Creatinine, Ser: 1.19 mg/dL (ref 0.61–1.24)
GFR, Estimated: >60 mL/min (ref >60)
Glucose, Bld: 91 mg/dL (ref 70–99)
Potassium: 4.2 mmol/L (ref 3.5–5.1)
Sodium: 136 mmol/L (ref 135–145)
Total Bilirubin: 0.6 mg/dL (ref 0.3–1.2)
Total Protein: 5.2 g/dL — ABNORMAL LOW (ref 6.5–8.1)

## 2022-12-29 LAB — MAGNESIUM: Magnesium: 1.9 mg/dL (ref 1.7–2.4)

## 2022-12-29 MED ORDER — DARATUMUMAB-HYALURONIDASE-FIHJ 1800-30000 MG-UT/15ML ~~LOC~~ SOLN
1800.0000 mg | Freq: Once | SUBCUTANEOUS | Status: AC
  Administered 2022-12-29: 1800 mg via SUBCUTANEOUS
  Filled 2022-12-29: qty 15

## 2022-12-29 MED ORDER — LORATADINE 10 MG PO TABS
10.0000 mg | ORAL_TABLET | Freq: Once | ORAL | Status: AC
  Administered 2022-12-29: 10 mg via ORAL
  Filled 2022-12-29: qty 1

## 2022-12-29 MED ORDER — SODIUM CHLORIDE 0.9% FLUSH
10.0000 mL | Freq: Once | INTRAVENOUS | Status: AC
  Administered 2022-12-29: 10 mL via INTRAVENOUS

## 2022-12-29 MED ORDER — ACETAMINOPHEN 325 MG PO TABS
650.0000 mg | ORAL_TABLET | Freq: Once | ORAL | Status: AC
  Administered 2022-12-29: 650 mg via ORAL
  Filled 2022-12-29: qty 2

## 2022-12-29 MED ORDER — HEPARIN SOD (PORK) LOCK FLUSH 100 UNIT/ML IV SOLN
500.0000 [IU] | Freq: Once | INTRAVENOUS | Status: AC
  Administered 2022-12-29: 500 [IU] via INTRAVENOUS

## 2022-12-29 MED ORDER — FILGRASTIM-SNDZ 480 MCG/0.8ML IJ SOSY
480.0000 ug | PREFILLED_SYRINGE | Freq: Once | INTRAMUSCULAR | Status: AC
  Administered 2022-12-29: 480 ug via SUBCUTANEOUS
  Filled 2022-12-29: qty 0.8

## 2022-12-29 MED ORDER — DIPHENHYDRAMINE HCL 25 MG PO CAPS: 50.0000 mg | ORAL_CAPSULE | Freq: Once | ORAL | Status: DC

## 2022-12-29 NOTE — Patient Instructions (Signed)
Johnny Lucas  Discharge Instructions: Thank you for choosing [facility] to provide your oncology and hematology care.  If you have a lab appointment with the [facility], please come in thru the Main Entrance and check in at the main information desk.  Wear comfortable clothing and clothing appropriate for easy access to any Portacath or PICC line.   We strive to give you quality time with your provider. You may need to reschedule your appointment if you arrive late (15 or more minutes).  Arriving late affects you and other patients whose appointments are after yours.  Also, if you miss three or more appointments without notifying the office, you may be dismissed from the clinic at the provider's discretion.      For prescription refill requests, have your pharmacy contact our office and allow 72 hours for refills to be completed.    Today you received the following chemotherapy and/or immunotherapy agents Darzalex injection    To help prevent nausea and vomiting after your treatment, we encourage you to take your nausea medication as directed.  BELOW ARE SYMPTOMS THAT SHOULD BE REPORTED IMMEDIATELY: *FEVER GREATER THAN 100.4 F (38 C) OR HIGHER *CHILLS OR SWEATING *NAUSEA AND VOMITING THAT IS NOT CONTROLLED WITH YOUR NAUSEA MEDICATION *UNUSUAL SHORTNESS OF BREATH *UNUSUAL BRUISING OR BLEEDING *URINARY PROBLEMS (pain or burning when urinating, or frequent urination) *BOWEL PROBLEMS (unusual diarrhea, constipation, pain near the anus) TENDERNESS IN MOUTH AND THROAT WITH OR WITHOUT PRESENCE OF ULCERS (sore throat, sores in mouth, or a toothache) UNUSUAL RASH, SWELLING OR PAIN  UNUSUAL VAGINAL DISCHARGE OR ITCHING   Items with * indicate a potential emergency and should be followed up as soon as possible or go to the Emergency Department if any problems should occur.  Please show the CHEMOTHERAPY ALERT CARD or IMMUNOTHERAPY ALERT CARD at check-in to the  Emergency Department and triage nurse.  Should you have questions after your visit or need to cancel or reschedule your appointment, please contact [facility] [phone]  and follow the prompts.  Office hours are 8:00 a.m. to 4:30 p.m. Monday - Friday. Please note that voicemails left after 4:00 p.m. may not be returned until the following business day.  We are closed weekends and major holidays. You have access to a nurse at all times for urgent questions. Please call the main number to the clinic [phone] and follow the prompts.  For any non-urgent questions, you may also contact your provider using MyChart. We now offer e-Visits for anyone 59 and older to request care online for non-urgent symptoms. For details visit [email or web address].   Also download the MyChart app! Go to the app store, search "MyChart", open the app, select [facility], and log in with your MyChart username and password.

## 2022-12-29 NOTE — Progress Notes (Signed)
Chaplain engaged in an initial visit with Johnny Lucas, learning about his grief journey with his mom's passing in 2008 and his current life.  Yordan shared about missing his mom, his support system, and what brings him joy daily.  Chaplain normalized his grief.  [place] with [place], surrounded by his children, has been life-giving for him.    Chaplain offered listening, presence, and support.    12/29/22 1100  Clinical Encounter Type  Visited With Patient  Visit Type Initial;Spiritual support

## 2022-12-29 NOTE — Progress Notes (Signed)
Orders received to discontinue diphenhydramine from premedication and add loratidine 10 mg oral x 1  T.O. Dr Rhys Martini, PharmD

## 2022-12-29 NOTE — Progress Notes (Signed)
Labs were reviewed today with MD. Will give gcsf 480 mcg injection today and proceed with Dara injection today as well.   Patient did not take pre-meds today. Will give at clinic.   Treatment given per orders. Patient tolerated it well without problems. Vitals stable and discharged home from clinic ambulatory. Follow up as scheduled.

## 2022-12-30 ENCOUNTER — Inpatient Hospital Stay: Payer: Medicare Other

## 2022-12-30 ENCOUNTER — Telehealth: Payer: Self-pay | Admitting: Pharmacist

## 2022-12-30 ENCOUNTER — Other Ambulatory Visit (HOSPITAL_COMMUNITY): Payer: Self-pay

## 2022-12-30 ENCOUNTER — Telehealth: Payer: Self-pay

## 2022-12-30 VITALS — BP 136/69 | HR 65 | Temp 99.7°F | Resp 20 | Wt 210.8 lb

## 2022-12-30 DIAGNOSIS — Z5112 Encounter for antineoplastic immunotherapy: Secondary | ICD-10-CM | POA: Diagnosis not present

## 2022-12-30 DIAGNOSIS — C9002 Multiple myeloma in relapse: Secondary | ICD-10-CM | POA: Diagnosis not present

## 2022-12-30 DIAGNOSIS — C50822 Malignant neoplasm of overlapping sites of left male breast: Secondary | ICD-10-CM | POA: Diagnosis not present

## 2022-12-30 DIAGNOSIS — N189 Chronic kidney disease, unspecified: Secondary | ICD-10-CM | POA: Diagnosis not present

## 2022-12-30 DIAGNOSIS — C9000 Multiple myeloma not having achieved remission: Secondary | ICD-10-CM

## 2022-12-30 DIAGNOSIS — D801 Nonfamilial hypogammaglobulinemia: Secondary | ICD-10-CM | POA: Diagnosis not present

## 2022-12-30 DIAGNOSIS — G4700 Insomnia, unspecified: Secondary | ICD-10-CM

## 2022-12-30 DIAGNOSIS — I10 Essential (primary) hypertension: Secondary | ICD-10-CM

## 2022-12-30 DIAGNOSIS — C7951 Secondary malignant neoplasm of bone: Secondary | ICD-10-CM

## 2022-12-30 DIAGNOSIS — D631 Anemia in chronic kidney disease: Secondary | ICD-10-CM | POA: Diagnosis not present

## 2022-12-30 MED ORDER — DENOSUMAB 120 MG/1.7ML ~~LOC~~ SOLN
120.0000 mg | Freq: Once | SUBCUTANEOUS | Status: AC
  Administered 2022-12-30: 120 mg via SUBCUTANEOUS
  Filled 2022-12-30: qty 1.7

## 2022-12-30 MED ORDER — POMALIDOMIDE 2 MG PO CAPS: ORAL_CAPSULE | ORAL | 0 refills | Status: DC

## 2022-12-30 NOTE — Telephone Encounter (Signed)
Oral Oncology Patient Advocate Encounter   Received notification that prior authorization for Pomalyst is required.   PA submitted on 12/30/22  Key [id number]  Status is pending     Berdine Addison, [facility]  [phone] (phone) [phone] (fax) 12/30/2022 10:57 AM

## 2022-12-30 NOTE — Telephone Encounter (Signed)
Oral Oncology Patient Advocate Encounter  Prior Authorization for Pomalyst has been approved.    PA# [id number]  Effective dates: 12/30/22 through 06/28/23  Patients co-pay is $3,333.02.    Berdine Addison, [facility]  [phone] (phone) [phone] (fax) 12/30/2022 11:07 AM

## 2022-12-30 NOTE — Patient Instructions (Signed)
[facility]  Discharge Instructions: Thank you for choosing [facility] to provide your oncology and hematology care.  If you have a lab appointment with the Cancer Center, please come in thru the Main Entrance and check in at the main information desk.  Wear comfortable clothing and clothing appropriate for easy access to any Portacath or PICC line.   We strive to give you quality time with your provider. You may need to reschedule your appointment if you arrive late (15 or more minutes).  Arriving late affects you and other patients whose appointments are after yours.  Also, if you miss three or more appointments without notifying the office, you may be dismissed from the clinic at the provider's discretion.      For prescription refill requests, have your pharmacy contact our office and allow 72 hours for refills to be completed.    Today you received the following chemotherapy and/or immunotherapy agents Xgeva.  Denosumab Injection (Oncology) What is this medication? DENOSUMAB (den oh SUE mab) prevents weakened bones caused by cancer. It may also be used to treat noncancerous bone tumors that cannot be removed by surgery. It can also be used to treat high calcium levels in the blood caused by cancer. It works by blocking a protein that causes bones to break down quickly. This slows down the release of calcium from bones, which lowers calcium levels in your blood. It also makes your bones stronger and less likely to break (fracture). This medicine may be used for other purposes; ask your health care provider or pharmacist if you have questions. COMMON BRAND NAME(S): XGEVA What should I tell my care team before I take this medication? They need to know if you have any of these conditions: Dental disease Having surgery or tooth extraction Infection Kidney disease Low levels of calcium or vitamin D in the blood Malnutrition On hemodialysis Skin conditions  or sensitivity Thyroid or parathyroid disease An unusual reaction to denosumab, other medications, foods, dyes, or preservatives Pregnant or trying to get pregnant Breast-feeding How should I use this medication? This medication is for injection under the skin. It is given by your care team in a hospital or clinic setting. A special MedGuide will be given to you before each treatment. Be sure to read this information carefully each time. Talk to your care team about the use of this medication in children. While it may be prescribed for children as young as 13 years for selected conditions, precautions do apply. Overdosage: If you think you have taken too much of this medicine contact a poison control center or emergency room at once. NOTE: This medicine is only for you. Do not share this medicine with others. What if I miss a dose? Keep appointments for follow-up doses. It is important not to miss your dose. Call your care team if you are unable to keep an appointment. What may interact with this medication? Do not take this medication with any of the following: Other medications containing denosumab This medication may also interact with the following: Medications that lower your chance of fighting infection Steroid medications, such as prednisone or cortisone This list may not describe all possible interactions. Give your health care provider a list of all the medicines, herbs, non-prescription drugs, or dietary supplements you use. Also tell them if you smoke, drink alcohol, or use illegal drugs. Some items may interact with your medicine. What should I watch for while using this medication? Your condition will   be monitored carefully while you are receiving this medication. You may need blood work while taking this medication. This medication may increase your risk of getting an infection. Call your care team for advice if you get a fever, chills, sore throat, or other symptoms of a cold or  flu. Do not treat yourself. Try to avoid being around people who are sick. You should make sure you get enough calcium and vitamin D while you are taking this medication, unless your care team tells you not to. Discuss the foods you eat and the vitamins you take with your care team. Some people who take this medication have severe bone, joint, or muscle pain. This medication may also increase your risk for jaw problems or a broken thigh bone. Tell your care team right away if you have severe pain in your jaw, bones, joints, or muscles. Tell your care team if you have any pain that does not go away or that gets worse. Talk to your care team if you may be pregnant. Serious birth defects can occur if you take this medication during pregnancy and for 5 months after the last dose. You will need a negative pregnancy test before starting this medication. Contraception is recommended while taking this medication and for 5 months after the last dose. Your care team can help you find the option that works for you. What side effects may I notice from receiving this medication? Side effects that you should report to your care team as soon as possible: Allergic reactions--skin rash, itching, hives, swelling of the face, lips, tongue, or throat Bone, joint, or muscle pain Low calcium level--muscle pain or cramps, confusion, tingling, or numbness in the hands or feet Osteonecrosis of the jaw--pain, swelling, or redness in the mouth, numbness of the jaw, poor healing after dental work, unusual discharge from the mouth, visible bones in the mouth Side effects that usually do not require medical attention (report to your care team if they continue or are bothersome): Cough Diarrhea Fatigue Headache Nausea This list may not describe all possible side effects. Call your doctor for medical advice about side effects. You may report side effects to FDA at 1-800-FDA-1088. Where should I keep my medication? This medication  is given in a hospital or clinic. It will not be stored at home. NOTE: This sheet is a summary. It may not cover all possible information. If you have questions about this medicine, talk to your doctor, pharmacist, or health care provider.  2023 Elsevier/Gold Standard (2022-05-04 00:00:00)        To help prevent nausea and vomiting after your treatment, we encourage you to take your nausea medication as directed.  BELOW ARE SYMPTOMS THAT SHOULD BE REPORTED IMMEDIATELY: *FEVER GREATER THAN 100.4 F (38 C) OR HIGHER *CHILLS OR SWEATING *NAUSEA AND VOMITING THAT IS NOT CONTROLLED WITH YOUR NAUSEA MEDICATION *UNUSUAL SHORTNESS OF BREATH *UNUSUAL BRUISING OR BLEEDING *URINARY PROBLEMS (pain or burning when urinating, or frequent urination) *BOWEL PROBLEMS (unusual diarrhea, constipation, pain near the anus) TENDERNESS IN MOUTH AND THROAT WITH OR WITHOUT PRESENCE OF ULCERS (sore throat, sores in mouth, or a toothache) UNUSUAL RASH, SWELLING OR PAIN  UNUSUAL VAGINAL DISCHARGE OR ITCHING   Items with * indicate a potential emergency and should be followed up as soon as possible or go to the Emergency Department if any problems should occur.  Please show the CHEMOTHERAPY ALERT CARD or IMMUNOTHERAPY ALERT CARD at check-in to the Emergency Department and triage nurse.  Should you have   questions after your visit or need to cancel or reschedule your appointment, please contact [facility] [phone]  and follow the prompts.  Office hours are 8:00 a.m. to 4:30 p.m. Monday - Friday. Please note that voicemails left after 4:00 p.m. may not be returned until the following business day.  We are closed weekends and major holidays. You have access to a nurse at all times for urgent questions. Please call the main number to the clinic [phone] and follow the prompts.  For any non-urgent questions, you may also contact your provider using MyChart. We now offer e-Visits for anyone 18  and older to request care online for non-urgent symptoms. For details visit [email or web address].   Also download the MyChart app! Go to the app store, search "MyChart", open the app, select [facility], and log in with your MyChart username and password.   

## 2022-12-30 NOTE — Telephone Encounter (Signed)
Oral Chemotherapy Pharmacist Encounter   Patient will now be using a grant to fill his Pomalyst at [facility], instead of mfg assistance.   Attempted to call patient informed and provided with the phone number to [place], LVM.     Current Pomalyst prescription was redirected to [facility] as it has not been filled yet. Supportive information was faxed to [facility].   Future refills for Pomalyst should be sent the [facility]. Pharmacy added to his pharmacy list in [place].     Darl Pikes, PharmD, BCPS, BCOP, CPP Hematology/Oncology Clinical Pharmacist [facility]/DB/AP [facility] [phone]  12/30/2022 10:59 AM

## 2022-12-30 NOTE — Progress Notes (Signed)
Patient tolerated Xgeva injection with no complaints voiced.  Calcium yesterday was 8.2.  Site clean and dry with no bruising or swelling noted.  No complaints of pain.  Discharged with vital signs stable and no signs or symptoms of distress noted.

## 2022-12-31 NOTE — Telephone Encounter (Signed)
Patient aware of the change to [facility]. Attempted to provide patient with their phone number but he stated he already had it.

## 2023-01-06 NOTE — Telephone Encounter (Signed)
[facility] to check on the status of the patient's medication. Med was delivered on 01/05/23. Copay $0 utilizing the copay grant.

## 2023-01-08 DIAGNOSIS — R0902 Hypoxemia: Secondary | ICD-10-CM | POA: Diagnosis not present

## 2023-01-08 DIAGNOSIS — R42 Dizziness and giddiness: Secondary | ICD-10-CM | POA: Diagnosis not present

## 2023-01-12 DIAGNOSIS — Z6829 Body mass index (BMI) 29.0-29.9, adult: Secondary | ICD-10-CM | POA: Diagnosis not present

## 2023-01-12 DIAGNOSIS — D469 Myelodysplastic syndrome, unspecified: Secondary | ICD-10-CM | POA: Diagnosis not present

## 2023-01-12 DIAGNOSIS — D801 Nonfamilial hypogammaglobulinemia: Secondary | ICD-10-CM | POA: Diagnosis not present

## 2023-01-12 DIAGNOSIS — Z20828 Contact with and (suspected) exposure to other viral communicable diseases: Secondary | ICD-10-CM | POA: Diagnosis not present

## 2023-01-12 DIAGNOSIS — C9000 Multiple myeloma not having achieved remission: Secondary | ICD-10-CM | POA: Diagnosis not present

## 2023-01-12 DIAGNOSIS — R030 Elevated blood-pressure reading, without diagnosis of hypertension: Secondary | ICD-10-CM | POA: Diagnosis not present

## 2023-01-12 DIAGNOSIS — J101 Influenza due to other identified influenza virus with other respiratory manifestations: Secondary | ICD-10-CM | POA: Diagnosis not present

## 2023-01-18 DIAGNOSIS — Z9484 Stem cells transplant status: Secondary | ICD-10-CM | POA: Diagnosis not present

## 2023-01-18 DIAGNOSIS — J320 Chronic maxillary sinusitis: Secondary | ICD-10-CM | POA: Diagnosis not present

## 2023-01-18 DIAGNOSIS — C9000 Multiple myeloma not having achieved remission: Secondary | ICD-10-CM | POA: Diagnosis not present

## 2023-01-18 DIAGNOSIS — D61818 Other pancytopenia: Secondary | ICD-10-CM | POA: Diagnosis not present

## 2023-01-19 ENCOUNTER — Telehealth (HOSPITAL_COMMUNITY): Payer: Self-pay | Admitting: *Deleted

## 2023-01-19 ENCOUNTER — Encounter (HOSPITAL_COMMUNITY): Payer: Self-pay

## 2023-01-19 ENCOUNTER — Encounter: Payer: Self-pay | Admitting: Hematology

## 2023-01-19 ENCOUNTER — Emergency Department (HOSPITAL_COMMUNITY)
Admission: EM | Admit: 2023-01-19 | Discharge: 2023-01-19 | Disposition: A | Discharge status: Home / Self Care | Payer: Medicare Other | Attending: Emergency Medicine | Admitting: Emergency Medicine

## 2023-01-19 ENCOUNTER — Other Ambulatory Visit: Payer: Self-pay

## 2023-01-19 ENCOUNTER — Encounter (HOSPITAL_COMMUNITY): Payer: Self-pay | Admitting: Hematology

## 2023-01-19 ENCOUNTER — Emergency Department (HOSPITAL_COMMUNITY): Payer: Medicare Other

## 2023-01-19 DIAGNOSIS — D709 Neutropenia, unspecified: Secondary | ICD-10-CM | POA: Insufficient documentation

## 2023-01-19 DIAGNOSIS — Z7982 Long term (current) use of aspirin: Secondary | ICD-10-CM | POA: Insufficient documentation

## 2023-01-19 DIAGNOSIS — J9811 Atelectasis: Secondary | ICD-10-CM | POA: Diagnosis not present

## 2023-01-19 DIAGNOSIS — C9000 Multiple myeloma not having achieved remission: Secondary | ICD-10-CM | POA: Insufficient documentation

## 2023-01-19 DIAGNOSIS — R5383 Other fatigue: Secondary | ICD-10-CM | POA: Diagnosis present

## 2023-01-19 DIAGNOSIS — C7951 Secondary malignant neoplasm of bone: Secondary | ICD-10-CM | POA: Insufficient documentation

## 2023-01-19 DIAGNOSIS — J90 Pleural effusion, not elsewhere classified: Secondary | ICD-10-CM | POA: Diagnosis not present

## 2023-01-19 LAB — CBC WITH DIFFERENTIAL/PLATELET
Abs Immature Granulocytes: 0.01 10*3/uL (ref 0.00–0.07)
Basophils Absolute: 0 10*3/uL (ref 0.0–0.1)
Basophils Relative: 2 %
Eosinophils Absolute: 0.1 10*3/uL (ref 0.0–0.5)
Eosinophils Relative: 13 %
HCT: 29.1 % — ABNORMAL LOW (ref 39.0–52.0)
Hemoglobin: 9.7 g/dL — ABNORMAL LOW (ref 13.0–17.0)
Immature Granulocytes: 2 %
Lymphocytes Relative: 37 %
Lymphs Abs: 0.2 10*3/uL — ABNORMAL LOW (ref 0.7–4.0)
MCH: 34.4 pg — ABNORMAL HIGH (ref 26.0–34.0)
MCHC: 33.3 g/dL (ref 30.0–36.0)
MCV: 103.2 fL — ABNORMAL HIGH (ref 80.0–100.0)
Monocytes Absolute: 0.2 10*3/uL (ref 0.1–1.0)
Monocytes Relative: 27 %
Neutro Abs: 0.1 10*3/uL — CL (ref 1.7–7.7)
Neutrophils Relative %: 19 %
Platelets: 110 10*3/uL — ABNORMAL LOW (ref 150–400)
RBC: 2.82 MIL/uL — ABNORMAL LOW (ref 4.22–5.81)
RDW: 16.1 % — ABNORMAL HIGH (ref 11.5–15.5)
WBC: 0.6 10*3/uL — CL (ref 4.0–10.5)
nRBC: 0 % (ref 0.0–0.2)

## 2023-01-19 LAB — COMPREHENSIVE METABOLIC PANEL
ALT: 78 U/L — ABNORMAL HIGH (ref 0–44)
AST: 43 U/L — ABNORMAL HIGH (ref 15–41)
Albumin: 2.6 g/dL — ABNORMAL LOW (ref 3.5–5.0)
Alkaline Phosphatase: 32 U/L — ABNORMAL LOW (ref 38–126)
Anion gap: 7 (ref 5–15)
BUN: 19 mg/dL (ref 8–23)
CO2: 20 mmol/L — ABNORMAL LOW (ref 22–32)
Calcium: 7.8 mg/dL — ABNORMAL LOW (ref 8.9–10.3)
Chloride: 105 mmol/L (ref 98–111)
Creatinine, Ser: 1.48 mg/dL — ABNORMAL HIGH (ref 0.61–1.24)
GFR, Estimated: 49 mL/min — ABNORMAL LOW (ref >60)
Glucose, Bld: 108 mg/dL — ABNORMAL HIGH (ref 70–99)
Potassium: 3.8 mmol/L (ref 3.5–5.1)
Sodium: 132 mmol/L — ABNORMAL LOW (ref 135–145)
Total Bilirubin: 0.4 mg/dL (ref 0.3–1.2)
Total Protein: 5.1 g/dL — ABNORMAL LOW (ref 6.5–8.1)

## 2023-01-19 MED ORDER — SODIUM CHLORIDE 0.9 % IV BOLUS
1000.0000 mL | Freq: Once | INTRAVENOUS | Status: AC
  Administered 2023-01-19: 1000 mL via INTRAVENOUS

## 2023-01-19 MED ORDER — HEPARIN SOD (PORK) LOCK FLUSH 100 UNIT/ML IV SOLN
500.0000 [IU] | Freq: Once | INTRAVENOUS | Status: AC
  Administered 2023-01-19: 500 [IU]
  Filled 2023-01-19: qty 5

## 2023-01-19 MED ORDER — SODIUM CHLORIDE 0.9 % IV SOLN: INTRAVENOUS | Status: DC

## 2023-01-19 MED ORDER — TBO-FILGRASTIM 480 MCG/0.8ML ~~LOC~~ SOSY
480.0000 ug | PREFILLED_SYRINGE | Freq: Once | SUBCUTANEOUS | Status: AC
  Administered 2023-01-19: 480 ug via SUBCUTANEOUS
  Filled 2023-01-19: qty 0.8

## 2023-01-19 NOTE — ED Provider Notes (Signed)
4:14 PM Discussed with Dr Delton Coombes.  Advises to give a dose of 480 mcg Granix.  Advises to hold pomalyst.  Office will call in AM to see for a recheck appointment tomorrow and possibly more Granix.  Patient is having diarrhea but no abdominal pain.  Vital signs are okay.  Will discharge home with return precautions.   Sherwood Gambler, MD 01/19/23 [phone]

## 2023-01-19 NOTE — ED Notes (Signed)
AC aware of needing granix medication.

## 2023-01-19 NOTE — Discharge Instructions (Signed)
You were given a medicine called Granix to help increase your white blood cells.  For now, do not take the Pomalyst until told to by Dr. Raliegh Ip.  If you develop fever, bloody stools, abdominal pain, or any other new/concerning symptoms then return to the ER for evaluation or call 911.

## 2023-01-19 NOTE — ED Triage Notes (Signed)
Pt tested positive for flu 3 weeks ago and feels no better. Pt reports he receives chemotherapy.

## 2023-01-19 NOTE — Telephone Encounter (Signed)
Son Johnny Lucas to advise that patient was diagnosed with influenza last Monday.  States that he was not given any medication and has progressively worsened since.  Is not eating or taking in adequate fluid, is weak and has copious amounts of sinus drainage.  Advised to take him to the ER since he is on active treatment to rule out infectious process.  Verbalized understanding and Dr. Delton Coombes made aware.

## 2023-01-19 NOTE — ED Notes (Signed)
Critical Lab Values of WBC and Absolute Neutrophil discussed with De. Vanita Panda

## 2023-01-19 NOTE — ED Notes (Signed)
Patient complains of feeling weak and having a cough for a few weeks. He is not taking in fluids or nutrition much these days. He also say she has been having episodes of diarrhea

## 2023-01-19 NOTE — ED Provider Notes (Signed)
[facility] Provider Note   CSN: [id number] Arrival date & time: 01/19/23  1152     History {Add pertinent medical, surgical, social history, OB history to HPI:1} Chief Complaint  Patient presents with   Influenza    Johnny Lucas is a 77 y.o. male.  HPI Patient presents from home with his son who assists with the history.  Patient has ongoing therapy for multiple myeloma with metastases to bone.  Last chemotherapy was 3 weeks ago, next scheduled for 1 week from now.  Patient had influenza earlier this month, has had difficulty with strength, p.o. intake, fatigue since that time.  No fever, no focal pain, no focal weakness.    Home Medications Prior to Admission medications   Medication Sig Start Date End Date Taking? Authorizing Provider  acetaminophen (TYLENOL) 500 MG tablet Take 1,000 mg by mouth every 6 (six) hours as needed for moderate pain.    [provider]  acyclovir (ZOVIRAX) 800 MG tablet Take 1 tablet (800 mg total) by mouth 2 (two) times daily. 08/20/22   Harriett Rush, PA-C  albuterol (VENTOLIN HFA) 108 (90 Base) MCG/ACT inhaler Inhale 2 puffs into the lungs every 6 (six) hours as needed for wheezing or shortness of breath.    [provider]  ALPRAZolam Duanne Moron) 0.5 MG tablet TAKE 1 TABLET BY MOUTH AT BEDTIME AS NEEDED FOR anxiety / SLEEP 07/28/22   Derek Jack, MD  amoxicillin-clavulanate (AUGMENTIN) 875-125 MG tablet Take 1 tablet by mouth 2 (two) times daily. 07/27/22   [provider]  aspirin EC 81 MG tablet Take 81 mg by mouth daily.     [provider]  Azelastine HCl 137 MCG/SPRAY SOLN Place 1 spray into both nostrils daily. 08/11/22   [provider]  benzonatate (TESSALON) 100 MG capsule Take 100 mg by mouth 3 (three) times daily as needed. 07/10/22   [provider]  budesonide (PULMICORT) 0.5 MG/2ML nebulizer solution Add 2 mL (1 vial) of budesonide  into 240 mL saline irrigation bottle and irrigate both nostrils morning and night (one bottle total per day). 11/16/22   [provider]  denosumab (XGEVA) 120 MG/1.7ML SOLN injection Inject 120 mg into the skin every 30 (thirty) days.     [provider]  dexamethasone (DECADRON) 4 MG tablet TAKE FIVE TABLETS BY MOUTH WEEKLY 05/12/22   Derek Jack, MD  diphenhydrAMINE (BENADRYL) 25 MG tablet Take 25 mg by mouth daily as needed for allergies.    [provider]  doxycycline (VIBRA-TABS) 100 MG tablet Take 100 mg by mouth 2 (two) times daily. 07/10/22   [provider]  FLOVENT HFA 110 MCG/ACT inhaler 1 puff 2 (two) times daily. 07/13/22   [provider]  folic acid (FOLVITE) 1 MG tablet TAKE 1 TABLET BY MOUTH DAILY 06/01/22   Derek Jack, MD  gabapentin (NEURONTIN) 300 MG capsule TAKE ONE CAPSULE BY MOUTH THREE TIMES DAILY 04/30/22   Derek Jack, MD  Loperamide HCl (IMODIUM PO) Take by mouth as needed.    [provider]  loratadine (CLARITIN) 10 MG tablet Take 1 tablet (10 mg total) by mouth daily. 01/20/22   Derek Jack, MD  metoprolol succinate (TOPROL-XL) 100 MG 24 hr tablet Take 100 mg by mouth daily. 05/23/21   [provider]  Multiple Vitamin (MULTIVITAMIN WITH MINERALS) TABS tablet Take 1 tablet by mouth daily.     [provider]  ondansetron (ZOFRAN) 8 MG tablet  TAKE 1 TABLET BY MOUTH EVERY 8 HOURS AS NEEDED FOR NAUSEA AND VOMITING 08/19/20   Derek Jack, MD  pantoprazole (PROTONIX) 40 MG tablet TAKE 1 TABLET EVERY OTHER DAY 08/18/22   Derek Jack, MD  polyethylene glycol (MIRALAX / GLYCOLAX) packet Take 17 g by mouth daily as needed for mild constipation. 12/05/18   Kathie Dike, MD  pomalidomide (POMALYST) 2 MG capsule TAKE 1 CAPSULE BY MOUTH EVERY DAY FOR 21 DAYS ON FOLLOWED BY 7 DAYS OFF 12/30/22   Derek Jack, MD  predniSONE (DELTASONE) 10 MG tablet Take by  mouth. 07/13/22   [provider]  sildenafil (VIAGRA) 100 MG tablet Take by mouth. 10/02/22   [provider]  tamoxifen (NOLVADEX) 20 MG tablet TAKE 1 TABLET EVERY DAY 08/18/22   Derek Jack, MD      Allergies    Cefepime    Review of Systems   Review of Systems  All other systems reviewed and are negative.   Physical Exam Updated Vital Signs BP 107/61 (BP Location: Left Arm)   Pulse 80   Temp 97.8 F (36.6 C) (Oral)   Resp 16   Ht '5\' 9"'$  (1.753 m)   Wt 90.7 kg   SpO2 98%   BMI 29.53 kg/m  Physical Exam Vitals and nursing note reviewed.  Constitutional:      General: He is not in acute distress.    Appearance: He is well-developed.  HENT:     Head: Normocephalic and atraumatic.  Eyes:     Conjunctiva/sclera: Conjunctivae normal.  Cardiovascular:     Rate and Rhythm: Normal rate and regular rhythm.  Pulmonary:     Effort: Pulmonary effort is normal. No respiratory distress.     Breath sounds: No stridor.  Abdominal:     General: There is no distension.  Skin:    General: Skin is warm and dry.  Neurological:     Mental Status: He is alert and oriented to person, place, and time.     ED Results / Procedures / Treatments   Labs (all labs ordered are listed, but only abnormal results are displayed) Labs Reviewed  COMPREHENSIVE METABOLIC PANEL - Abnormal; Notable for the following components:      Result Value   Sodium 132 (*)    CO2 20 (*)    Glucose, Bld 108 (*)    Creatinine, Ser 1.48 (*)    Calcium 7.8 (*)    Total Protein 5.1 (*)    Albumin 2.6 (*)    AST 43 (*)    ALT 78 (*)    Alkaline Phosphatase 32 (*)    GFR, Estimated 49 (*)    All other components within normal limits  CBC WITH DIFFERENTIAL/PLATELET - Abnormal; Notable for the following components:   WBC 0.6 (*)    RBC 2.82 (*)    Hemoglobin 9.7 (*)    HCT 29.1 (*)    MCV 103.2 (*)    MCH 34.4 (*)    RDW 16.1 (*)    Platelets 110 (*)    Neutro Abs 0.1 (*)     Lymphs Abs 0.2 (*)    All other components within normal limits    EKG None  Radiology DG Chest 2 View  Result Date: 01/19/2023 CLINICAL DATA:  Fatigue.  Immunocompromised EXAM: CHEST - 2 VIEW COMPARISON:  01/12/2023 FINDINGS: Left upper chest IJ port with tip at the SVC right atrial junction region. Fixation hardware along the lower cervical spine right shoulder hardware  as well. Tiny pleural effusions with some adjacent atelectasis. No pneumothorax, consolidation. Normal cardiopericardial silhouette with tortuous ectatic aorta. There is some nodular areas identified along the left lung base which appears slightly more prominent today but this could be technical. These overlie the posterior aspect of the left eighth and ninth ribs. IMPRESSION: Chest port.  Tiny effusions and adjacent atelectasis. Subtle nodular areas along the left lung base. These could be parenchymal versus bony and are slightly better seen today which could be technical. Please correlate for any known history or dedicated evaluation when appropriate Electronically Signed   By: Jill Side M.D.   On: 01/19/2023 13:54    Procedures Procedures  {Document cardiac monitor, telemetry assessment procedure when appropriate:1}  Medications Ordered in ED Medications  sodium chloride 0.9 % bolus 1,000 mL (0 mLs Intravenous Stopped 01/19/23 1448)    ED Course/ Medical Decision Making/ A&P   {   Click here for ABCD2, HEART and other calculatorsREFRESH Note before signing :1}                          Medical Decision Making Immunocompromised elderly male presents with his son who assists with the history.  Patient is awake, alert, weak in appearance, but in no distress, hemodynamically stable, without distal neurovascular compromise. Differential including immunocompromised opportunistic infection, dehydration, influenza symptoms all considered.  Patient received fluids, while x-ray, labs, monitoring all started.  Amount and/or  Complexity of Data Reviewed Independent Historian: caregiver External Data Reviewed: notes.    Details: Ongoing therapy for multiple myeloma Labs: ordered. Decision-making details documented in ED Course. Radiology: ordered and independent interpretation performed. Decision-making details documented in ED Course. ECG/medicine tests: ordered and independent interpretation performed. Decision-making details documented in ED Course.  Risk OTC drugs. Prescription drug management. Decision regarding hospitalization.   Cardiac 70 sinus normal Pulse ox 100% room air normal ***  {Document critical care time when appropriate:1} {Document review of labs and clinical decision tools ie heart score, Chads2Vasc2 etc:1}  {Document your independent review of radiology images, and any outside records:1} {Document your discussion with family members, caretakers, and with consultants:1} {Document social determinants of health affecting pt's care:1} {Document your decision making why or why not admission, treatments were needed:1} Final Clinical Impression(s) / ED Diagnoses Final diagnoses:  None    Rx / DC Orders ED Discharge Orders     None

## 2023-01-20 ENCOUNTER — Encounter: Payer: Self-pay | Admitting: *Deleted

## 2023-01-20 ENCOUNTER — Inpatient Hospital Stay: Payer: Medicare Other

## 2023-01-20 VITALS — BP 117/63 | HR 86 | Temp 97.6°F | Resp 17

## 2023-01-20 DIAGNOSIS — C9002 Multiple myeloma in relapse: Secondary | ICD-10-CM | POA: Diagnosis not present

## 2023-01-20 DIAGNOSIS — D702 Other drug-induced agranulocytosis: Secondary | ICD-10-CM

## 2023-01-20 DIAGNOSIS — D801 Nonfamilial hypogammaglobulinemia: Secondary | ICD-10-CM | POA: Diagnosis not present

## 2023-01-20 DIAGNOSIS — N189 Chronic kidney disease, unspecified: Secondary | ICD-10-CM | POA: Diagnosis not present

## 2023-01-20 DIAGNOSIS — C50822 Malignant neoplasm of overlapping sites of left male breast: Secondary | ICD-10-CM | POA: Diagnosis not present

## 2023-01-20 DIAGNOSIS — Z5112 Encounter for antineoplastic immunotherapy: Secondary | ICD-10-CM | POA: Diagnosis not present

## 2023-01-20 DIAGNOSIS — D631 Anemia in chronic kidney disease: Secondary | ICD-10-CM | POA: Diagnosis not present

## 2023-01-20 MED ORDER — FILGRASTIM-AYOW 480 MCG/0.8ML ~~LOC~~ SOSY
480.0000 ug | PREFILLED_SYRINGE | Freq: Once | SUBCUTANEOUS | Status: AC
  Administered 2023-01-20: 480 ug via SUBCUTANEOUS
  Filled 2023-01-20: qty 0.8

## 2023-01-20 MED ORDER — HEPARIN SOD (PORK) LOCK FLUSH 100 UNIT/ML IV SOLN
500.0000 [IU] | Freq: Once | INTRAVENOUS | Status: AC
  Administered 2023-01-20: 500 [IU] via INTRAVENOUS

## 2023-01-20 MED ORDER — SODIUM CHLORIDE 0.9% FLUSH
10.0000 mL | Freq: Once | INTRAVENOUS | Status: AC
  Administered 2023-01-20: 10 mL via INTRAVENOUS

## 2023-01-20 NOTE — Progress Notes (Signed)
CRITICAL VALUE ALERT Critical value received:  WBC 0.8 , ANC 0.1.  Date of notification:  01-20-2023 Time of notification: 1251 Critical value read back:  Yes.   Nurse who received alert:  B.Fatemah Pourciau RN.  MD notified time and response:  Dr. Delton Coombes @ 12:55 . Patient to receive 480 mcg G-CSF injection.

## 2023-01-20 NOTE — Patient Instructions (Signed)
[facility]  Discharge Instructions: Thank you for choosing [facility] to provide your oncology and hematology care.  If you have a lab appointment with the [facility], please come in thru the Main Entrance and check in at the main information desk.  Wear comfortable clothing and clothing appropriate for easy access to any Portacath or PICC line.   We strive to give you quality time with your provider. You may need to reschedule your appointment if you arrive late (15 or more minutes).  Arriving late affects you and other patients whose appointments are after yours.  Also, if you miss three or more appointments without notifying the office, you may be dismissed from the clinic at the provider's discretion.      For prescription refill requests, have your pharmacy contact our office and allow 72 hours for refills to be completed.    Today you had your port flushed, labs drawn, and injection   To help prevent nausea and vomiting after your treatment, we encourage you to take your nausea medication as directed.  BELOW ARE SYMPTOMS THAT SHOULD BE REPORTED IMMEDIATELY: *FEVER GREATER THAN 100.4 F (38 C) OR HIGHER *CHILLS OR SWEATING *NAUSEA AND VOMITING THAT IS NOT CONTROLLED WITH YOUR NAUSEA MEDICATION *UNUSUAL SHORTNESS OF BREATH *UNUSUAL BRUISING OR BLEEDING *URINARY PROBLEMS (pain or burning when urinating, or frequent urination) *BOWEL PROBLEMS (unusual diarrhea, constipation, pain near the anus) TENDERNESS IN MOUTH AND THROAT WITH OR WITHOUT PRESENCE OF ULCERS (sore throat, sores in mouth, or a toothache) UNUSUAL RASH, SWELLING OR PAIN  UNUSUAL VAGINAL DISCHARGE OR ITCHING   Items with * indicate a potential emergency and should be followed up as soon as possible or go to the Emergency Department if any problems should occur.  Please show the CHEMOTHERAPY ALERT CARD or IMMUNOTHERAPY ALERT CARD at check-in to the Emergency Department and triage  nurse.  Should you have questions after your visit or need to cancel or reschedule your appointment, please contact [facility] [phone]  and follow the prompts.  Office hours are 8:00 a.m. to 4:30 p.m. Monday - Friday. Please note that voicemails left after 4:00 p.m. may not be returned until the following business day.  We are closed weekends and major holidays. You have access to a nurse at all times for urgent questions. Please call the main number to the clinic [phone] and follow the prompts.  For any non-urgent questions, you may also contact your provider using MyChart. We now offer e-Visits for anyone 42 and older to request care online for non-urgent symptoms. For details visit [email or web address].   Also download the MyChart app! Go to the app store, search "MyChart", open the app, select [facility], and log in with your MyChart username and password.

## 2023-01-20 NOTE — Progress Notes (Signed)
Johnny Lucas was contacted by telephone to verify understanding of discharge instructions status post their most recent discharge from the hospital on the date:  01/19/23.  Reviewed cancer center appointments.  Verification of understanding for oncology specific follow-up was validated using the Teach Back method.    Transportation to appointments were confirmed for the patient as being self/caregiver.  Johnny Lucas's questions were addressed to their satisfaction upon completion of this post discharge follow-up call for outpatient oncology.

## 2023-01-20 NOTE — Progress Notes (Signed)
Port flush with labs done per orders. Releuko given per orders. [place] instructed to come back for injection tomorrow and Friday. Friday labs and injection.  Treatment given per orders. Patient tolerated it well without problems. Vitals stable and discharged home from clinic ambulatory. Follow up as scheduled.

## 2023-01-21 ENCOUNTER — Other Ambulatory Visit: Payer: Medicare Other

## 2023-01-21 ENCOUNTER — Inpatient Hospital Stay: Payer: Medicare Other

## 2023-01-21 ENCOUNTER — Other Ambulatory Visit (HOSPITAL_COMMUNITY): Payer: Self-pay | Admitting: Hematology

## 2023-01-21 VITALS — BP 109/67 | HR 69 | Temp 98.7°F | Resp 20

## 2023-01-21 DIAGNOSIS — C9002 Multiple myeloma in relapse: Secondary | ICD-10-CM | POA: Diagnosis not present

## 2023-01-21 DIAGNOSIS — C9000 Multiple myeloma not having achieved remission: Secondary | ICD-10-CM

## 2023-01-21 DIAGNOSIS — D801 Nonfamilial hypogammaglobulinemia: Secondary | ICD-10-CM | POA: Diagnosis not present

## 2023-01-21 DIAGNOSIS — Z5112 Encounter for antineoplastic immunotherapy: Secondary | ICD-10-CM | POA: Diagnosis not present

## 2023-01-21 DIAGNOSIS — D702 Other drug-induced agranulocytosis: Secondary | ICD-10-CM

## 2023-01-21 DIAGNOSIS — D631 Anemia in chronic kidney disease: Secondary | ICD-10-CM | POA: Diagnosis not present

## 2023-01-21 DIAGNOSIS — N189 Chronic kidney disease, unspecified: Secondary | ICD-10-CM | POA: Diagnosis not present

## 2023-01-21 DIAGNOSIS — C50822 Malignant neoplasm of overlapping sites of left male breast: Secondary | ICD-10-CM | POA: Diagnosis not present

## 2023-01-21 LAB — CBC WITH DIFFERENTIAL/PLATELET
Abs Immature Granulocytes: 0 10*3/uL (ref 0.00–0.07)
Band Neutrophils: 1 %
Basophils Absolute: 0 10*3/uL (ref 0.0–0.1)
Basophils Relative: 1 %
Eosinophils Absolute: 0 10*3/uL (ref 0.0–0.5)
Eosinophils Relative: 5 %
HCT: 30.8 % — ABNORMAL LOW (ref 39.0–52.0)
Hemoglobin: 10.2 g/dL — ABNORMAL LOW (ref 13.0–17.0)
Lymphocytes Relative: 58 %
Lymphs Abs: 0.5 10*3/uL — ABNORMAL LOW (ref 0.7–4.0)
MCH: 34 pg (ref 26.0–34.0)
MCHC: 33.1 g/dL (ref 30.0–36.0)
MCV: 102.7 fL — ABNORMAL HIGH (ref 80.0–100.0)
Monocytes Absolute: 0.2 10*3/uL (ref 0.1–1.0)
Monocytes Relative: 25 %
Myelocytes: 1 %
Neutro Abs: 0.1 10*3/uL — CL (ref 1.7–7.7)
Neutrophils Relative %: 9 %
Platelets: 122 10*3/uL — ABNORMAL LOW (ref 150–400)
RBC: 3 MIL/uL — ABNORMAL LOW (ref 4.22–5.81)
RDW: 16.1 % — ABNORMAL HIGH (ref 11.5–15.5)
WBC: 0.8 10*3/uL — CL (ref 4.0–10.5)
nRBC: 0 % (ref 0.0–0.2)

## 2023-01-21 MED ORDER — FILGRASTIM-AYOW 480 MCG/0.8ML ~~LOC~~ SOSY
480.0000 ug | PREFILLED_SYRINGE | Freq: Once | SUBCUTANEOUS | Status: AC
  Administered 2023-01-21: 480 ug via SUBCUTANEOUS
  Filled 2023-01-21: qty 0.8

## 2023-01-21 NOTE — Progress Notes (Signed)
Patient tolerated injection with no complaints voiced. Site clean and dry with no bruising or swelling noted at site. See MAR for details. Band aid applied.  Patient stable during and after injection. VSS with discharge and left in satisfactory condition with no s/s of distress noted.  

## 2023-01-21 NOTE — Progress Notes (Signed)
Patient had [place] yesterday of 0.1.  Per Secure chat with Dr. Delton Coombes, he wants patient to get G-CSF today and then return for labs tomorrow with possible G-CSF.  Pharmacy aware and will enter orders.

## 2023-01-21 NOTE — Patient Instructions (Signed)
[facility]  Discharge Instructions: Thank you for choosing [facility] to provide your oncology and hematology care.  If you have a lab appointment with the [facility], please come in thru the Main Entrance and check in at the main information desk.  Wear comfortable clothing and clothing appropriate for easy access to any Portacath or PICC line.   We strive to give you quality time with your provider. You may need to reschedule your appointment if you arrive late (15 or more minutes).  Arriving late affects you and other patients whose appointments are after yours.  Also, if you miss three or more appointments without notifying the office, you may be dismissed from the clinic at the provider's discretion.      For prescription refill requests, have your pharmacy contact our office and allow 72 hours for refills to be completed.    Today you received the following Releuko, return as scheduled.   To help prevent nausea and vomiting after your treatment, we encourage you to take your nausea medication as directed.  BELOW ARE SYMPTOMS THAT SHOULD BE REPORTED IMMEDIATELY: *FEVER GREATER THAN 100.4 F (38 C) OR HIGHER *CHILLS OR SWEATING *NAUSEA AND VOMITING THAT IS NOT CONTROLLED WITH YOUR NAUSEA MEDICATION *UNUSUAL SHORTNESS OF BREATH *UNUSUAL BRUISING OR BLEEDING *URINARY PROBLEMS (pain or burning when urinating, or frequent urination) *BOWEL PROBLEMS (unusual diarrhea, constipation, pain near the anus) TENDERNESS IN MOUTH AND THROAT WITH OR WITHOUT PRESENCE OF ULCERS (sore throat, sores in mouth, or a toothache) UNUSUAL RASH, SWELLING OR PAIN  UNUSUAL VAGINAL DISCHARGE OR ITCHING   Items with * indicate a potential emergency and should be followed up as soon as possible or go to the Emergency Department if any problems should occur.  Please show the CHEMOTHERAPY ALERT CARD or IMMUNOTHERAPY ALERT CARD at check-in to the Emergency Department and triage  nurse.  Should you have questions after your visit or need to cancel or reschedule your appointment, please contact [facility] [phone]  and follow the prompts.  Office hours are 8:00 a.m. to 4:30 p.m. Monday - Friday. Please note that voicemails left after 4:00 p.m. may not be returned until the following business day.  We are closed weekends and major holidays. You have access to a nurse at all times for urgent questions. Please call the main number to the clinic [phone] and follow the prompts.  For any non-urgent questions, you may also contact your provider using MyChart. We now offer e-Visits for anyone 36 and older to request care online for non-urgent symptoms. For details visit [email or web address].   Also download the MyChart app! Go to the app store, search "MyChart", open the app, select [facility], and log in with your MyChart username and password.

## 2023-01-22 ENCOUNTER — Inpatient Hospital Stay: Payer: Medicare Other

## 2023-01-22 VITALS — BP 102/70 | HR 77 | Temp 98.7°F | Resp 18

## 2023-01-22 DIAGNOSIS — Z5112 Encounter for antineoplastic immunotherapy: Secondary | ICD-10-CM | POA: Diagnosis not present

## 2023-01-22 DIAGNOSIS — C50822 Malignant neoplasm of overlapping sites of left male breast: Secondary | ICD-10-CM | POA: Diagnosis not present

## 2023-01-22 DIAGNOSIS — C9000 Multiple myeloma not having achieved remission: Secondary | ICD-10-CM

## 2023-01-22 DIAGNOSIS — D801 Nonfamilial hypogammaglobulinemia: Secondary | ICD-10-CM | POA: Diagnosis not present

## 2023-01-22 DIAGNOSIS — D702 Other drug-induced agranulocytosis: Secondary | ICD-10-CM

## 2023-01-22 DIAGNOSIS — D631 Anemia in chronic kidney disease: Secondary | ICD-10-CM | POA: Diagnosis not present

## 2023-01-22 DIAGNOSIS — C9002 Multiple myeloma in relapse: Secondary | ICD-10-CM | POA: Diagnosis not present

## 2023-01-22 DIAGNOSIS — N189 Chronic kidney disease, unspecified: Secondary | ICD-10-CM | POA: Diagnosis not present

## 2023-01-22 LAB — CBC WITH DIFFERENTIAL/PLATELET
Abs Immature Granulocytes: 0.03 10*3/uL (ref 0.00–0.07)
Basophils Absolute: 0.1 10*3/uL (ref 0.0–0.1)
Basophils Relative: 4 %
Eosinophils Absolute: 0.1 10*3/uL (ref 0.0–0.5)
Eosinophils Relative: 7 %
HCT: 31.1 % — ABNORMAL LOW (ref 39.0–52.0)
Hemoglobin: 10.3 g/dL — ABNORMAL LOW (ref 13.0–17.0)
Immature Granulocytes: 2 %
Lymphocytes Relative: 20 %
Lymphs Abs: 0.4 10*3/uL — ABNORMAL LOW (ref 0.7–4.0)
MCH: 34.2 pg — ABNORMAL HIGH (ref 26.0–34.0)
MCHC: 33.1 g/dL (ref 30.0–36.0)
MCV: 103.3 fL — ABNORMAL HIGH (ref 80.0–100.0)
Monocytes Absolute: 0.5 10*3/uL (ref 0.1–1.0)
Monocytes Relative: 24 %
Neutro Abs: 0.9 10*3/uL — ABNORMAL LOW (ref 1.7–7.7)
Neutrophils Relative %: 43 %
Platelets: 129 10*3/uL — ABNORMAL LOW (ref 150–400)
RBC: 3.01 MIL/uL — ABNORMAL LOW (ref 4.22–5.81)
RDW: 15.9 % — ABNORMAL HIGH (ref 11.5–15.5)
WBC: 2 10*3/uL — ABNORMAL LOW (ref 4.0–10.5)
nRBC: 0 % (ref 0.0–0.2)

## 2023-01-22 MED ORDER — FILGRASTIM-AYOW 480 MCG/0.8ML ~~LOC~~ SOSY
480.0000 ug | PREFILLED_SYRINGE | Freq: Once | SUBCUTANEOUS | Status: AC
  Administered 2023-01-22: 480 ug via SUBCUTANEOUS
  Filled 2023-01-22: qty 0.8

## 2023-01-22 NOTE — Patient Instructions (Signed)
[facility]  Discharge Instructions: Thank you for choosing [facility] to provide your oncology and hematology care.  If you have a lab appointment with the [facility], please come in thru the Main Entrance and check in at the main information desk.  Wear comfortable clothing and clothing appropriate for easy access to any Portacath or PICC line.   We strive to give you quality time with your provider. You may need to reschedule your appointment if you arrive late (15 or more minutes).  Arriving late affects you and other patients whose appointments are after yours.  Also, if you miss three or more appointments without notifying the office, you may be dismissed from the clinic at the provider's discretion.      For prescription refill requests, have your pharmacy contact our office and allow 72 hours for refills to be completed.    Today you received the following chemotherapy and/or immunotherapy agents Releuko.      To help prevent nausea and vomiting after your treatment, we encourage you to take your nausea medication as directed.  BELOW ARE SYMPTOMS THAT SHOULD BE REPORTED IMMEDIATELY: *FEVER GREATER THAN 100.4 F (38 C) OR HIGHER *CHILLS OR SWEATING *NAUSEA AND VOMITING THAT IS NOT CONTROLLED WITH YOUR NAUSEA MEDICATION *UNUSUAL SHORTNESS OF BREATH *UNUSUAL BRUISING OR BLEEDING *URINARY PROBLEMS (pain or burning when urinating, or frequent urination) *BOWEL PROBLEMS (unusual diarrhea, constipation, pain near the anus) TENDERNESS IN MOUTH AND THROAT WITH OR WITHOUT PRESENCE OF ULCERS (sore throat, sores in mouth, or a toothache) UNUSUAL RASH, SWELLING OR PAIN  UNUSUAL VAGINAL DISCHARGE OR ITCHING   Items with * indicate a potential emergency and should be followed up as soon as possible or go to the Emergency Department if any problems should occur.  Please show the CHEMOTHERAPY ALERT CARD or IMMUNOTHERAPY ALERT CARD at check-in to the  Emergency Department and triage nurse.  Should you have questions after your visit or need to cancel or reschedule your appointment, please contact [facility] [phone]  and follow the prompts.  Office hours are 8:00 a.m. to 4:30 p.m. Monday - Friday. Please note that voicemails left after 4:00 p.m. may not be returned until the following business day.  We are closed weekends and major holidays. You have access to a nurse at all times for urgent questions. Please call the main number to the clinic [phone] and follow the prompts.  For any non-urgent questions, you may also contact your provider using MyChart. We now offer e-Visits for anyone 26 and older to request care online for non-urgent symptoms. For details visit [email or web address].   Also download the MyChart app! Go to the app store, search "MyChart", open the app, select [facility], and log in with your MyChart username and password.

## 2023-01-22 NOTE — Progress Notes (Signed)
CLEARANCE CHENAULT presents today for injection per the provider's orders.  Releuko administration without incident; injection site WNL; see MAR for injection details.  Patient tolerated procedure well and without incident. Patient remained stable throughout visit.  No questions or complaints noted at this time. Patient discharged ambulatory and in stable condition.

## 2023-01-25 ENCOUNTER — Other Ambulatory Visit: Payer: Self-pay

## 2023-01-25 DIAGNOSIS — G4700 Insomnia, unspecified: Secondary | ICD-10-CM

## 2023-01-25 DIAGNOSIS — I10 Essential (primary) hypertension: Secondary | ICD-10-CM

## 2023-01-25 MED ORDER — POMALIDOMIDE 2 MG PO CAPS: ORAL_CAPSULE | ORAL | 0 refills | Status: DC

## 2023-01-25 NOTE — Telephone Encounter (Signed)
Chart reviewed. Pomalyst refilled per last office note with Dr. Katragadda.  

## 2023-01-26 ENCOUNTER — Encounter (HOSPITAL_COMMUNITY): Payer: Self-pay | Admitting: Hematology

## 2023-01-26 ENCOUNTER — Inpatient Hospital Stay: Payer: Medicare Other

## 2023-01-26 ENCOUNTER — Encounter: Payer: Self-pay | Admitting: Hematology

## 2023-01-26 VITALS — BP 120/78 | HR 88 | Temp 97.8°F | Resp 20 | Wt 198.2 lb

## 2023-01-26 DIAGNOSIS — Z5112 Encounter for antineoplastic immunotherapy: Secondary | ICD-10-CM | POA: Diagnosis not present

## 2023-01-26 DIAGNOSIS — N189 Chronic kidney disease, unspecified: Secondary | ICD-10-CM | POA: Diagnosis not present

## 2023-01-26 DIAGNOSIS — D631 Anemia in chronic kidney disease: Secondary | ICD-10-CM | POA: Diagnosis not present

## 2023-01-26 DIAGNOSIS — C9000 Multiple myeloma not having achieved remission: Secondary | ICD-10-CM

## 2023-01-26 DIAGNOSIS — C50822 Malignant neoplasm of overlapping sites of left male breast: Secondary | ICD-10-CM | POA: Diagnosis not present

## 2023-01-26 DIAGNOSIS — C9002 Multiple myeloma in relapse: Secondary | ICD-10-CM | POA: Diagnosis not present

## 2023-01-26 DIAGNOSIS — D801 Nonfamilial hypogammaglobulinemia: Secondary | ICD-10-CM | POA: Diagnosis not present

## 2023-01-26 LAB — COMPREHENSIVE METABOLIC PANEL
ALT: 106 U/L — ABNORMAL HIGH (ref 0–44)
AST: 46 U/L — ABNORMAL HIGH (ref 15–41)
Albumin: 2.9 g/dL — ABNORMAL LOW (ref 3.5–5.0)
Alkaline Phosphatase: 43 U/L (ref 38–126)
Anion gap: 8 (ref 5–15)
BUN: 15 mg/dL (ref 8–23)
CO2: 24 mmol/L (ref 22–32)
Calcium: 8.3 mg/dL — ABNORMAL LOW (ref 8.9–10.3)
Chloride: 105 mmol/L (ref 98–111)
Creatinine, Ser: 1.29 mg/dL — ABNORMAL HIGH (ref 0.61–1.24)
GFR, Estimated: 58 mL/min — ABNORMAL LOW (ref >60)
Glucose, Bld: 117 mg/dL — ABNORMAL HIGH (ref 70–99)
Potassium: 3.9 mmol/L (ref 3.5–5.1)
Sodium: 137 mmol/L (ref 135–145)
Total Bilirubin: 0.5 mg/dL (ref 0.3–1.2)
Total Protein: 4.9 g/dL — ABNORMAL LOW (ref 6.5–8.1)

## 2023-01-26 LAB — CBC WITH DIFFERENTIAL/PLATELET
Abs Immature Granulocytes: 0.02 10*3/uL (ref 0.00–0.07)
Basophils Absolute: 0.1 10*3/uL (ref 0.0–0.1)
Basophils Relative: 1 %
Eosinophils Absolute: 0.2 10*3/uL (ref 0.0–0.5)
Eosinophils Relative: 3 %
HCT: 28.6 % — ABNORMAL LOW (ref 39.0–52.0)
Hemoglobin: 9.5 g/dL — ABNORMAL LOW (ref 13.0–17.0)
Immature Granulocytes: 0 %
Lymphocytes Relative: 22 %
Lymphs Abs: 1.2 10*3/uL (ref 0.7–4.0)
MCH: 34.5 pg — ABNORMAL HIGH (ref 26.0–34.0)
MCHC: 33.2 g/dL (ref 30.0–36.0)
MCV: 104 fL — ABNORMAL HIGH (ref 80.0–100.0)
Monocytes Absolute: 0.5 10*3/uL (ref 0.1–1.0)
Monocytes Relative: 10 %
Neutro Abs: 3.3 10*3/uL (ref 1.7–7.7)
Neutrophils Relative %: 64 %
Platelets: 160 10*3/uL (ref 150–400)
RBC: 2.75 MIL/uL — ABNORMAL LOW (ref 4.22–5.81)
RDW: 15.9 % — ABNORMAL HIGH (ref 11.5–15.5)
WBC: 5.3 10*3/uL (ref 4.0–10.5)
nRBC: 0 % (ref 0.0–0.2)

## 2023-01-26 MED ORDER — LORATADINE 10 MG PO TABS: 10.0000 mg | ORAL_TABLET | Freq: Once | ORAL | Status: DC

## 2023-01-26 MED ORDER — CETIRIZINE HCL 10 MG PO TABS
10.0000 mg | ORAL_TABLET | Freq: Every day | ORAL | Status: DC
  Administered 2023-01-26: 10 mg via ORAL
  Filled 2023-01-26: qty 1

## 2023-01-26 MED ORDER — SODIUM CHLORIDE 0.9% FLUSH
10.0000 mL | Freq: Once | INTRAVENOUS | Status: AC
  Administered 2023-01-26: 10 mL via INTRAVENOUS

## 2023-01-26 MED ORDER — HEPARIN SOD (PORK) LOCK FLUSH 100 UNIT/ML IV SOLN
500.0000 [IU] | Freq: Once | INTRAVENOUS | Status: AC
  Administered 2023-01-26: 500 [IU] via INTRAVENOUS

## 2023-01-26 MED ORDER — DEXAMETHASONE 4 MG PO TABS
20.0000 mg | ORAL_TABLET | Freq: Once | ORAL | Status: AC
  Administered 2023-01-26: 20 mg via ORAL
  Filled 2023-01-26: qty 5

## 2023-01-26 MED ORDER — ACETAMINOPHEN 325 MG PO TABS
650.0000 mg | ORAL_TABLET | Freq: Once | ORAL | Status: AC
  Administered 2023-01-26: 650 mg via ORAL
  Filled 2023-01-26: qty 2

## 2023-01-26 MED ORDER — DARATUMUMAB-HYALURONIDASE-FIHJ 1800-30000 MG-UT/15ML ~~LOC~~ SOLN
1800.0000 mg | Freq: Once | SUBCUTANEOUS | Status: AC
  Administered 2023-01-26: 1800 mg via SUBCUTANEOUS
  Filled 2023-01-26: qty 15

## 2023-01-26 MED ORDER — SODIUM CHLORIDE 0.9% FLUSH
10.0000 mL | INTRAVENOUS | Status: DC | PRN
  Administered 2023-01-26: 10 mL via INTRAVENOUS

## 2023-01-26 NOTE — Progress Notes (Signed)
Johnny Lucas presents today for Daratumumab injection per the provider's orders.  Stable during administration without incident; injection site WNL; see MAR for injection details.  Patient tolerated procedure well and without incident.  No questions or complaints noted at this time.

## 2023-01-26 NOTE — Progress Notes (Signed)
Labs meet parameters for treatment today . Will proceed with treatment as planned.

## 2023-01-26 NOTE — Patient Instructions (Signed)
[facility]  Discharge Instructions: Thank you for choosing [facility] to provide your oncology and hematology care.  If you have a lab appointment with the Cancer Center, please come in thru the Main Entrance and check in at the main information desk.  Wear comfortable clothing and clothing appropriate for easy access to any Portacath or PICC line.   We strive to give you quality time with your provider. You may need to reschedule your appointment if you arrive late (15 or more minutes).  Arriving late affects you and other patients whose appointments are after yours.  Also, if you miss three or more appointments without notifying the office, you may be dismissed from the clinic at the provider's discretion.      For prescription refill requests, have your pharmacy contact our office and allow 72 hours for refills to be completed.    Today you received the following chemotherapy and/or immunotherapy agents Daratumumab      To help prevent nausea and vomiting after your treatment, we encourage you to take your nausea medication as directed.  BELOW ARE SYMPTOMS THAT SHOULD BE REPORTED IMMEDIATELY: *FEVER GREATER THAN 100.4 F (38 C) OR HIGHER *CHILLS OR SWEATING *NAUSEA AND VOMITING THAT IS NOT CONTROLLED WITH YOUR NAUSEA MEDICATION *UNUSUAL SHORTNESS OF BREATH *UNUSUAL BRUISING OR BLEEDING *URINARY PROBLEMS (pain or burning when urinating, or frequent urination) *BOWEL PROBLEMS (unusual diarrhea, constipation, pain near the anus) TENDERNESS IN MOUTH AND THROAT WITH OR WITHOUT PRESENCE OF ULCERS (sore throat, sores in mouth, or a toothache) UNUSUAL RASH, SWELLING OR PAIN  UNUSUAL VAGINAL DISCHARGE OR ITCHING   Items with * indicate a potential emergency and should be followed up as soon as possible or go to the Emergency Department if any problems should occur.  Please show the CHEMOTHERAPY ALERT CARD or IMMUNOTHERAPY ALERT CARD at check-in to the  Emergency Department and triage nurse.  Should you have questions after your visit or need to cancel or reschedule your appointment, please contact [facility] [phone]  and follow the prompts.  Office hours are 8:00 a.m. to 4:30 p.m. Monday - Friday. Please note that voicemails left after 4:00 p.m. may not be returned until the following business day.  We are closed weekends and major holidays. You have access to a nurse at all times for urgent questions. Please call the main number to the clinic [phone] and follow the prompts.  For any non-urgent questions, you may also contact your provider using MyChart. We now offer e-Visits for anyone 18 and older to request care online for non-urgent symptoms. For details visit [email or web address].   Also download the MyChart app! Go to the app store, search "MyChart", open the app, select [facility], and log in with your MyChart username and password.   

## 2023-01-27 ENCOUNTER — Inpatient Hospital Stay: Payer: Medicare Other

## 2023-01-27 VITALS — BP 118/58 | HR 63 | Temp 97.8°F | Resp 16

## 2023-01-27 DIAGNOSIS — C50822 Malignant neoplasm of overlapping sites of left male breast: Secondary | ICD-10-CM | POA: Diagnosis not present

## 2023-01-27 DIAGNOSIS — N189 Chronic kidney disease, unspecified: Secondary | ICD-10-CM | POA: Diagnosis not present

## 2023-01-27 DIAGNOSIS — C9002 Multiple myeloma in relapse: Secondary | ICD-10-CM | POA: Diagnosis not present

## 2023-01-27 DIAGNOSIS — C7951 Secondary malignant neoplasm of bone: Secondary | ICD-10-CM

## 2023-01-27 DIAGNOSIS — D801 Nonfamilial hypogammaglobulinemia: Secondary | ICD-10-CM | POA: Diagnosis not present

## 2023-01-27 DIAGNOSIS — Z5112 Encounter for antineoplastic immunotherapy: Secondary | ICD-10-CM | POA: Diagnosis not present

## 2023-01-27 DIAGNOSIS — D631 Anemia in chronic kidney disease: Secondary | ICD-10-CM | POA: Diagnosis not present

## 2023-01-27 DIAGNOSIS — N179 Acute kidney failure, unspecified: Secondary | ICD-10-CM

## 2023-01-27 DIAGNOSIS — C9000 Multiple myeloma not having achieved remission: Secondary | ICD-10-CM

## 2023-01-27 MED ORDER — DENOSUMAB 120 MG/1.7ML ~~LOC~~ SOLN
120.0000 mg | Freq: Once | SUBCUTANEOUS | Status: AC
  Administered 2023-01-27: 120 mg via SUBCUTANEOUS
  Filled 2023-01-27: qty 1.7

## 2023-01-27 MED ORDER — EPOETIN ALFA-EPBX 20000 UNIT/ML IJ SOLN
20000.0000 [IU] | Freq: Once | INTRAMUSCULAR | Status: AC
  Administered 2023-01-27: 20000 [IU] via SUBCUTANEOUS
  Filled 2023-01-27: qty 1

## 2023-01-27 NOTE — Patient Instructions (Signed)
[facility]  Discharge Instructions: Thank you for choosing [facility] to provide your oncology and hematology care.  If you have a lab appointment with the [facility], please come in thru the Main Entrance and check in at the main information desk.  Wear comfortable clothing and clothing appropriate for easy access to any Portacath or PICC line.   We strive to give you quality time with your provider. You may need to reschedule your appointment if you arrive late (15 or more minutes).  Arriving late affects you and other patients whose appointments are after yours.  Also, if you miss three or more appointments without notifying the office, you may be dismissed from the clinic at the provider's discretion.      For prescription refill requests, have your pharmacy contact our office and allow 72 hours for refills to be completed.    Today you received the following chemotherapy and/or immunotherapy agents Xgeva and Retacrit     BELOW ARE SYMPTOMS THAT SHOULD BE REPORTED IMMEDIATELY: *FEVER GREATER THAN 100.4 F (38 C) OR HIGHER *CHILLS OR SWEATING *NAUSEA AND VOMITING THAT IS NOT CONTROLLED WITH YOUR NAUSEA MEDICATION *UNUSUAL SHORTNESS OF BREATH *UNUSUAL BRUISING OR BLEEDING *URINARY PROBLEMS (pain or burning when urinating, or frequent urination) *BOWEL PROBLEMS (unusual diarrhea, constipation, pain near the anus) TENDERNESS IN MOUTH AND THROAT WITH OR WITHOUT PRESENCE OF ULCERS (sore throat, sores in mouth, or a toothache) UNUSUAL RASH, SWELLING OR PAIN  UNUSUAL VAGINAL DISCHARGE OR ITCHING   Items with * indicate a potential emergency and should be followed up as soon as possible or go to the Emergency Department if any problems should occur.  Please show the CHEMOTHERAPY ALERT CARD or IMMUNOTHERAPY ALERT CARD at check-in to the Emergency Department and triage nurse.  Should you have questions after your visit or need to cancel or reschedule  your appointment, please contact [facility] [phone]  and follow the prompts.  Office hours are 8:00 a.m. to 4:30 p.m. Monday - Friday. Please note that voicemails left after 4:00 p.m. may not be returned until the following business day.  We are closed weekends and major holidays. You have access to a nurse at all times for urgent questions. Please call the main number to the clinic [phone] and follow the prompts.  For any non-urgent questions, you may also contact your provider using MyChart. We now offer e-Visits for anyone 42 and older to request care online for non-urgent symptoms. For details visit [email or web address].   Also download the MyChart app! Go to the app store, search "MyChart", open the app, select [facility], and log in with your MyChart username and password.

## 2023-01-27 NOTE — Progress Notes (Signed)
.  Johnny Lucas presents today for injection per the provider's orders.  Xgeva and Retacrit 20,000U administration without incident; injection site WNL; see MAR for injection details.  Patient tolerated procedure well and without incident.  No questions or complaints noted at this time. Pt's Calcium noted to be 8.3 and hemoglobin noted to be 9.5  Pt denies any tooth or jaw pain, no recent or future dental appointments at this time. Pt report taking Calcium and Vit D supplements as directed.  Discharged from clinic ambulatory in stable condition. Alert and oriented x 3. F/U with [facility] as scheduled.

## 2023-02-18 ENCOUNTER — Other Ambulatory Visit: Payer: Self-pay | Admitting: Hematology

## 2023-02-18 ENCOUNTER — Other Ambulatory Visit: Payer: Self-pay

## 2023-02-18 DIAGNOSIS — X32XXXD Exposure to sunlight, subsequent encounter: Secondary | ICD-10-CM | POA: Diagnosis not present

## 2023-02-18 DIAGNOSIS — D225 Melanocytic nevi of trunk: Secondary | ICD-10-CM | POA: Diagnosis not present

## 2023-02-18 DIAGNOSIS — I10 Essential (primary) hypertension: Secondary | ICD-10-CM

## 2023-02-18 DIAGNOSIS — L570 Actinic keratosis: Secondary | ICD-10-CM | POA: Diagnosis not present

## 2023-02-18 DIAGNOSIS — L820 Inflamed seborrheic keratosis: Secondary | ICD-10-CM | POA: Diagnosis not present

## 2023-02-18 DIAGNOSIS — G4700 Insomnia, unspecified: Secondary | ICD-10-CM

## 2023-02-18 DIAGNOSIS — D485 Neoplasm of uncertain behavior of skin: Secondary | ICD-10-CM | POA: Diagnosis not present

## 2023-02-18 DIAGNOSIS — L308 Other specified dermatitis: Secondary | ICD-10-CM | POA: Diagnosis not present

## 2023-02-18 DIAGNOSIS — L821 Other seborrheic keratosis: Secondary | ICD-10-CM | POA: Diagnosis not present

## 2023-02-18 MED ORDER — POMALIDOMIDE 2 MG PO CAPS: ORAL_CAPSULE | ORAL | 0 refills | Status: DC

## 2023-02-18 NOTE — Telephone Encounter (Signed)
Chart reviewed. Pomalyst refilled per last office note with Dr. Delton Coombes.

## 2023-02-20 ENCOUNTER — Other Ambulatory Visit: Payer: Self-pay

## 2023-02-23 ENCOUNTER — Inpatient Hospital Stay: Payer: Medicare Other | Attending: Hematology

## 2023-02-23 ENCOUNTER — Inpatient Hospital Stay: Payer: Medicare Other

## 2023-02-23 VITALS — BP 125/54 | HR 52 | Temp 98.3°F | Resp 16 | Wt 201.4 lb

## 2023-02-23 DIAGNOSIS — C9000 Multiple myeloma not having achieved remission: Secondary | ICD-10-CM

## 2023-02-23 DIAGNOSIS — N179 Acute kidney failure, unspecified: Secondary | ICD-10-CM

## 2023-02-23 DIAGNOSIS — N189 Chronic kidney disease, unspecified: Secondary | ICD-10-CM | POA: Diagnosis not present

## 2023-02-23 DIAGNOSIS — C9002 Multiple myeloma in relapse: Secondary | ICD-10-CM | POA: Insufficient documentation

## 2023-02-23 DIAGNOSIS — C7951 Secondary malignant neoplasm of bone: Secondary | ICD-10-CM

## 2023-02-23 DIAGNOSIS — D631 Anemia in chronic kidney disease: Secondary | ICD-10-CM | POA: Diagnosis not present

## 2023-02-23 DIAGNOSIS — Z95828 Presence of other vascular implants and grafts: Secondary | ICD-10-CM

## 2023-02-23 LAB — COMPREHENSIVE METABOLIC PANEL
ALT: 11 U/L (ref 0–44)
AST: 14 U/L — ABNORMAL LOW (ref 15–41)
Albumin: 3.2 g/dL — ABNORMAL LOW (ref 3.5–5.0)
Alkaline Phosphatase: 29 U/L — ABNORMAL LOW (ref 38–126)
Anion gap: 8 (ref 5–15)
BUN: 15 mg/dL (ref 8–23)
CO2: 23 mmol/L (ref 22–32)
Calcium: 8 mg/dL — ABNORMAL LOW (ref 8.9–10.3)
Chloride: 104 mmol/L (ref 98–111)
Creatinine, Ser: 1.28 mg/dL — ABNORMAL HIGH (ref 0.61–1.24)
GFR, Estimated: 59 mL/min — ABNORMAL LOW (ref >60)
Glucose, Bld: 96 mg/dL (ref 70–99)
Potassium: 4.3 mmol/L (ref 3.5–5.1)
Sodium: 135 mmol/L (ref 135–145)
Total Bilirubin: 0.7 mg/dL (ref 0.3–1.2)
Total Protein: 5.2 g/dL — ABNORMAL LOW (ref 6.5–8.1)

## 2023-02-23 LAB — CBC WITH DIFFERENTIAL/PLATELET
Abs Immature Granulocytes: 0 10*3/uL (ref 0.00–0.07)
Basophils Absolute: 0.1 10*3/uL (ref 0.0–0.1)
Basophils Relative: 3 %
Eosinophils Absolute: 0.1 10*3/uL (ref 0.0–0.5)
Eosinophils Relative: 8 %
HCT: 27.2 % — ABNORMAL LOW (ref 39.0–52.0)
Hemoglobin: 8.8 g/dL — ABNORMAL LOW (ref 13.0–17.0)
Immature Granulocytes: 0 %
Lymphocytes Relative: 20 %
Lymphs Abs: 0.3 10*3/uL — ABNORMAL LOW (ref 0.7–4.0)
MCH: 34.4 pg — ABNORMAL HIGH (ref 26.0–34.0)
MCHC: 32.4 g/dL (ref 30.0–36.0)
MCV: 106.3 fL — ABNORMAL HIGH (ref 80.0–100.0)
Monocytes Absolute: 0.3 10*3/uL (ref 0.1–1.0)
Monocytes Relative: 17 %
Neutro Abs: 0.8 10*3/uL — ABNORMAL LOW (ref 1.7–7.7)
Neutrophils Relative %: 52 %
Platelets: ADEQUATE 10*3/uL (ref 150–400)
RBC: 2.56 MIL/uL — ABNORMAL LOW (ref 4.22–5.81)
RDW: 17 % — ABNORMAL HIGH (ref 11.5–15.5)
Smear Review: ADEQUATE
WBC: 1.5 10*3/uL — ABNORMAL LOW (ref 4.0–10.5)
nRBC: 0 % (ref 0.0–0.2)

## 2023-02-23 MED ORDER — DENOSUMAB 120 MG/1.7ML ~~LOC~~ SOLN
120.0000 mg | Freq: Once | SUBCUTANEOUS | Status: AC
  Administered 2023-02-23: 120 mg via SUBCUTANEOUS
  Filled 2023-02-23: qty 1.7

## 2023-02-23 MED ORDER — EPOETIN ALFA-EPBX 20000 UNIT/ML IJ SOLN
20000.0000 [IU] | Freq: Once | INTRAMUSCULAR | Status: AC
  Administered 2023-02-23: 20000 [IU] via SUBCUTANEOUS
  Filled 2023-02-23: qty 1

## 2023-02-23 MED ORDER — HEPARIN SOD (PORK) LOCK FLUSH 100 UNIT/ML IV SOLN
500.0000 [IU] | Freq: Once | INTRAVENOUS | Status: AC
  Administered 2023-02-23: 500 [IU] via INTRAVENOUS

## 2023-02-23 MED ORDER — SODIUM CHLORIDE 0.9% FLUSH
10.0000 mL | Freq: Once | INTRAVENOUS | Status: AC
  Administered 2023-02-23: 10 mL via INTRAVENOUS

## 2023-02-23 NOTE — Patient Instructions (Signed)
[facility]  Discharge Instructions: Thank you for choosing [facility] to provide your oncology and hematology care.  If you have a lab appointment with the [facility], please come in thru the Main Entrance and check in at the main information desk.  Wear comfortable clothing and clothing appropriate for easy access to any Portacath or PICC line.   We strive to give you quality time with your provider. You may need to reschedule your appointment if you arrive late (15 or more minutes).  Arriving late affects you and other patients whose appointments are after yours.  Also, if you miss three or more appointments without notifying the office, you may be dismissed from the clinic at the provider's discretion.      For prescription refill requests, have your pharmacy contact our office and allow 72 hours for refills to be completed.    Epoetin Alfa Injection What is this medication? EPOETIN ALFA (e POE e tin AL fa) treats low levels of red blood cells (anemia) caused by kidney disease, chemotherapy, or HIV medications. It can also be used in people who are at risk for blood loss during surgery. It works by Building control surveyor make more red blood cells, which reduces the need for blood transfusions. This medicine may be used for other purposes; ask your health care provider or pharmacist if you have questions. COMMON BRAND NAME(S): Epogen, Procrit, Retacrit What should I tell my care team before I take this medication? They need to know if you have any of these conditions: Blood clots Cancer Heart disease High blood pressure On dialysis Seizures Stroke An unusual or allergic reaction to epoetin alfa, albumin, benzyl alcohol, other medications, foods, dyes, or preservatives Pregnant or trying to get pregnant Breast-feeding How should I use this medication? This medication is injected into a vein or under the skin. It is usually given by your care team in a  hospital or clinic setting. It may also be given at home. If you get this medication at home, you will be taught how to prepare and give it. Use exactly as directed. Take it as directed on the prescription label at the same time every day. Keep taking it unless your care team tells you to stop. It is important that you put your used needles and syringes in a special sharps container. Do not put them in a trash can. If you do not have a sharps container, call your pharmacist or care team to get one. A special MedGuide will be given to you by the pharmacist with each prescription and refill. Be sure to read this information carefully each time. Talk to your care team about the use of this medication in children. While this medication may be used in children as young as 71 month of age for selected conditions, precautions do apply. Overdosage: If you think you have taken too much of this medicine contact a poison control center or emergency room at once. NOTE: This medicine is only for you. Do not share this medicine with others. What if I miss a dose? If you miss a dose, take it as soon as you can. If it is almost time for your next dose, take only that dose. Do not take double or extra doses. What may interact with this medication? Darbepoetin alfa Methoxy polyethylene glycol-epoetin beta This list may not describe all possible interactions. Give your health care provider a list of all the medicines, herbs, non-prescription drugs, or dietary  supplements you use. Also tell them if you smoke, drink alcohol, or use illegal drugs. Some items may interact with your medicine. What should I watch for while using this medication? Visit your care team for regular checks on your progress. Check your blood pressure as directed. Know what your blood pressure should be and when to contact your care team. Your condition will be monitored carefully while you are receiving this medication. You may need blood work while  taking this medication. What side effects may I notice from receiving this medication? Side effects that you should report to your care team as soon as possible: Allergic reactions--skin rash, itching, hives, swelling of the face, lips, tongue, or throat Blood clot--pain, swelling, or warmth in the leg, shortness of breath, chest pain Heart attack--pain or tightness in the chest, shoulders, arms, or jaw, nausea, shortness of breath, cold or clammy skin, feeling faint or lightheaded Increase in blood pressure Rash, fever, and swollen lymph nodes Redness, blistering, peeling, or loosening of the skin, including inside the mouth Seizures Stroke--sudden numbness or weakness of the face, arm, or leg, trouble speaking, confusion, trouble walking, loss of balance or coordination, dizziness, severe headache, change in vision Side effects that usually do not require medical attention (report to your care team if they continue or are bothersome): Bone, joint, or muscle pain Cough Headache Nausea Pain, redness, or irritation at injection site This list may not describe all possible side effects. Call your doctor for medical advice about side effects. You may report side effects to FDA at 1-800-FDA-1088. Where should I keep my medication? Keep out of the reach of children and pets. Store in a refrigerator. Do not freeze. Do not shake. Protect from light. Keep this medication in the original container until you are ready to take it. See product for storage information. Get rid of any unused medication after the expiration date. To get rid of medications that are no longer needed or have expired: Take the medication to a medication take-back program. Check with your pharmacy or law enforcement to find a location. If you cannot return the medication, ask your pharmacist or care team how to get rid of the medication safely. NOTE: This sheet is a summary. It may not cover all possible information. If you have  questions about this medicine, talk to your doctor, pharmacist, or health care provider.  2023 Elsevier/Gold Standard (2022-03-24 00:00:00)    Denosumab Injection (Oncology) What is this medication? DENOSUMAB (den oh SUE mab) prevents weakened bones caused by cancer. It may also be used to treat noncancerous bone tumors that cannot be removed by surgery. It can also be used to treat high calcium levels in the blood caused by cancer. It works by blocking a protein that causes bones to break down quickly. This slows down the release of calcium from bones, which lowers calcium levels in your blood. It also makes your bones stronger and less likely to break (fracture). This medicine may be used for other purposes; ask your health care provider or pharmacist if you have questions. COMMON BRAND NAME(S): XGEVA What should I tell my care team before I take this medication? They need to know if you have any of these conditions: Dental disease Having surgery or tooth extraction Infection Kidney disease Low levels of calcium or vitamin D in the blood Malnutrition On hemodialysis Skin conditions or sensitivity Thyroid or parathyroid disease An unusual reaction to denosumab, other medications, foods, dyes, or preservatives Pregnant or trying to get pregnant  Breast-feeding How should I use this medication? This medication is for injection under the skin. It is given by your care team in a hospital or clinic setting. A special MedGuide will be given to you before each treatment. Be sure to read this information carefully each time. Talk to your care team about the use of this medication in children. While it may be prescribed for children as young as 13 years for selected conditions, precautions do apply. Overdosage: If you think you have taken too much of this medicine contact a poison control center or emergency room at once. NOTE: This medicine is only for you. Do not share this medicine with  others. What if I miss a dose? Keep appointments for follow-up doses. It is important not to miss your dose. Call your care team if you are unable to keep an appointment. What may interact with this medication? Do not take this medication with any of the following: Other medications containing denosumab This medication may also interact with the following: Medications that lower your chance of fighting infection Steroid medications, such as prednisone or cortisone This list may not describe all possible interactions. Give your health care provider a list of all the medicines, herbs, non-prescription drugs, or dietary supplements you use. Also tell them if you smoke, drink alcohol, or use illegal drugs. Some items may interact with your medicine. What should I watch for while using this medication? Your condition will be monitored carefully while you are receiving this medication. You may need blood work while taking this medication. This medication may increase your risk of getting an infection. Call your care team for advice if you get a fever, chills, sore throat, or other symptoms of a cold or flu. Do not treat yourself. Try to avoid being around people who are sick. You should make sure you get enough calcium and vitamin D while you are taking this medication, unless your care team tells you not to. Discuss the foods you eat and the vitamins you take with your care team. Some people who take this medication have severe bone, joint, or muscle pain. This medication may also increase your risk for jaw problems or a broken thigh bone. Tell your care team right away if you have severe pain in your jaw, bones, joints, or muscles. Tell your care team if you have any pain that does not go away or that gets worse. Talk to your care team if you may be pregnant. Serious birth defects can occur if you take this medication during pregnancy and for 5 months after the last dose. You will need a negative  pregnancy test before starting this medication. Contraception is recommended while taking this medication and for 5 months after the last dose. Your care team can help you find the option that works for you. What side effects may I notice from receiving this medication? Side effects that you should report to your care team as soon as possible: Allergic reactions--skin rash, itching, hives, swelling of the face, lips, tongue, or throat Bone, joint, or muscle pain Low calcium level--muscle pain or cramps, confusion, tingling, or numbness in the hands or feet Osteonecrosis of the jaw--pain, swelling, or redness in the mouth, numbness of the jaw, poor healing after dental work, unusual discharge from the mouth, visible bones in the mouth Side effects that usually do not require medical attention (report to your care team if they continue or are bothersome): Cough Diarrhea Fatigue Headache Nausea This list may not describe  all possible side effects. Call your doctor for medical advice about side effects. You may report side effects to FDA at 1-800-FDA-1088. Where should I keep my medication? This medication is given in a hospital or clinic. It will not be stored at home. NOTE: This sheet is a summary. It may not cover all possible information. If you have questions about this medicine, talk to your doctor, pharmacist, or health care provider.  2023 Elsevier/Gold Standard (2022-05-04 00:00:00)   To help prevent nausea and vomiting after your treatment, we encourage you to take your nausea medication as directed.  BELOW ARE SYMPTOMS THAT SHOULD BE REPORTED IMMEDIATELY: *FEVER GREATER THAN 100.4 F (38 C) OR HIGHER *CHILLS OR SWEATING *NAUSEA AND VOMITING THAT IS NOT CONTROLLED WITH YOUR NAUSEA MEDICATION *UNUSUAL SHORTNESS OF BREATH *UNUSUAL BRUISING OR BLEEDING *URINARY PROBLEMS (pain or burning when urinating, or frequent urination) *BOWEL PROBLEMS (unusual diarrhea, constipation, pain near  the anus) TENDERNESS IN MOUTH AND THROAT WITH OR WITHOUT PRESENCE OF ULCERS (sore throat, sores in mouth, or a toothache) UNUSUAL RASH, SWELLING OR PAIN  UNUSUAL VAGINAL DISCHARGE OR ITCHING   Items with * indicate a potential emergency and should be followed up as soon as possible or go to the Emergency Department if any problems should occur.  Please show the CHEMOTHERAPY ALERT CARD or IMMUNOTHERAPY ALERT CARD at check-in to the Emergency Department and triage nurse.  Should you have questions after your visit or need to cancel or reschedule your appointment, please contact [facility] [phone]  and follow the prompts.  Office hours are 8:00 a.m. to 4:30 p.m. Monday - Friday. Please note that voicemails left after 4:00 p.m. may not be returned until the following business day.  We are closed weekends and major holidays. You have access to a nurse at all times for urgent questions. Please call the main number to the clinic [phone] and follow the prompts.  For any non-urgent questions, you may also contact your provider using MyChart. We now offer e-Visits for anyone 48 and older to request care online for non-urgent symptoms. For details visit [email or web address].   Also download the MyChart app! Go to the app store, search "MyChart", open the app, select [facility], and log in with your MyChart username and password.

## 2023-02-23 NOTE — Progress Notes (Signed)
We will hold Daratumumab injection today per Dr. Delton Coombes due to [place] 0.8.  Patient is in satisfactory condition with no complaints voiced.  Vital signs are stable.  All other labs are within treatment parameters.  Patient will receive Xgeva today.  We will proceed with injection per MD orders.   Patient tolerated Xgeva injection with no complaints voiced.  Site clean and dry with no bruising or swelling noted.  No complaints of pain.  Discharged with vital signs stable and no signs or symptoms of distress noted.

## 2023-02-24 ENCOUNTER — Inpatient Hospital Stay: Payer: Medicare Other

## 2023-03-02 ENCOUNTER — Inpatient Hospital Stay: Payer: Medicare Other | Attending: Hematology

## 2023-03-02 ENCOUNTER — Inpatient Hospital Stay: Payer: Medicare Other

## 2023-03-02 DIAGNOSIS — C9002 Multiple myeloma in relapse: Secondary | ICD-10-CM | POA: Diagnosis not present

## 2023-03-02 DIAGNOSIS — D702 Other drug-induced agranulocytosis: Secondary | ICD-10-CM

## 2023-03-02 DIAGNOSIS — Z79899 Other long term (current) drug therapy: Secondary | ICD-10-CM | POA: Diagnosis not present

## 2023-03-02 DIAGNOSIS — C9000 Multiple myeloma not having achieved remission: Secondary | ICD-10-CM

## 2023-03-02 DIAGNOSIS — Z5112 Encounter for antineoplastic immunotherapy: Secondary | ICD-10-CM | POA: Diagnosis not present

## 2023-03-02 LAB — CBC WITH DIFFERENTIAL/PLATELET
Abs Immature Granulocytes: 0 10*3/uL (ref 0.00–0.07)
Basophils Absolute: 0 10*3/uL (ref 0.0–0.1)
Basophils Relative: 2 %
Eosinophils Absolute: 0.1 10*3/uL (ref 0.0–0.5)
Eosinophils Relative: 3 %
HCT: 29.3 % — ABNORMAL LOW (ref 39.0–52.0)
Hemoglobin: 9.5 g/dL — ABNORMAL LOW (ref 13.0–17.0)
Immature Granulocytes: 0 %
Lymphocytes Relative: 19 %
Lymphs Abs: 0.3 10*3/uL — ABNORMAL LOW (ref 0.7–4.0)
MCH: 34.3 pg — ABNORMAL HIGH (ref 26.0–34.0)
MCHC: 32.4 g/dL (ref 30.0–36.0)
MCV: 105.8 fL — ABNORMAL HIGH (ref 80.0–100.0)
Monocytes Absolute: 0.3 10*3/uL (ref 0.1–1.0)
Monocytes Relative: 20 %
Neutro Abs: 0.9 10*3/uL — ABNORMAL LOW (ref 1.7–7.7)
Neutrophils Relative %: 56 %
Platelets: 143 10*3/uL — ABNORMAL LOW (ref 150–400)
RBC: 2.77 MIL/uL — ABNORMAL LOW (ref 4.22–5.81)
RDW: 17.2 % — ABNORMAL HIGH (ref 11.5–15.5)
WBC: 1.6 10*3/uL — ABNORMAL LOW (ref 4.0–10.5)
nRBC: 0 % (ref 0.0–0.2)

## 2023-03-02 LAB — COMPREHENSIVE METABOLIC PANEL
ALT: 12 U/L (ref 0–44)
AST: 19 U/L (ref 15–41)
Albumin: 3.2 g/dL — ABNORMAL LOW (ref 3.5–5.0)
Alkaline Phosphatase: 31 U/L — ABNORMAL LOW (ref 38–126)
Anion gap: 6 (ref 5–15)
BUN: 15 mg/dL (ref 8–23)
CO2: 26 mmol/L (ref 22–32)
Calcium: 8.4 mg/dL — ABNORMAL LOW (ref 8.9–10.3)
Chloride: 104 mmol/L (ref 98–111)
Creatinine, Ser: 1.43 mg/dL — ABNORMAL HIGH (ref 0.61–1.24)
GFR, Estimated: 51 mL/min — ABNORMAL LOW (ref >60)
Glucose, Bld: 111 mg/dL — ABNORMAL HIGH (ref 70–99)
Potassium: 4.2 mmol/L (ref 3.5–5.1)
Sodium: 136 mmol/L (ref 135–145)
Total Bilirubin: 0.5 mg/dL (ref 0.3–1.2)
Total Protein: 5.1 g/dL — ABNORMAL LOW (ref 6.5–8.1)

## 2023-03-02 LAB — IRON AND TIBC
Iron: 33 ug/dL — ABNORMAL LOW (ref 45–182)
Saturation Ratios: 11 % — ABNORMAL LOW (ref 17.9–39.5)
TIBC: 314 ug/dL (ref 250–450)
UIBC: 281 ug/dL

## 2023-03-02 LAB — MAGNESIUM: Magnesium: 1.7 mg/dL (ref 1.7–2.4)

## 2023-03-02 LAB — FERRITIN: Ferritin: 161 ng/mL (ref 24–336)

## 2023-03-02 MED ORDER — DARATUMUMAB-HYALURONIDASE-FIHJ 1800-30000 MG-UT/15ML ~~LOC~~ SOLN
1800.0000 mg | Freq: Once | SUBCUTANEOUS | Status: AC
  Administered 2023-03-02: 1800 mg via SUBCUTANEOUS
  Filled 2023-03-02: qty 15

## 2023-03-02 MED ORDER — FILGRASTIM-AYOW 480 MCG/0.8ML ~~LOC~~ SOSY
480.0000 ug | PREFILLED_SYRINGE | Freq: Once | SUBCUTANEOUS | Status: AC
  Administered 2023-03-02: 480 ug via SUBCUTANEOUS
  Filled 2023-03-02: qty 0.8

## 2023-03-02 NOTE — Patient Instructions (Signed)
[facility]  Discharge Instructions: Thank you for choosing [facility] to provide your oncology and hematology care.  If you have a lab appointment with the [facility], please come in thru the Main Entrance and check in at the main information desk.  Wear comfortable clothing and clothing appropriate for easy access to any Portacath or PICC line.   We strive to give you quality time with your provider. You may need to reschedule your appointment if you arrive late (15 or more minutes).  Arriving late affects you and other patients whose appointments are after yours.  Also, if you miss three or more appointments without notifying the office, you may be dismissed from the clinic at the provider's discretion.      For prescription refill requests, have your pharmacy contact our office and allow 72 hours for refills to be completed.    Today you received the following chemotherapy and/or immunotherapy agents Dara [place] and Releuko injection      To help prevent nausea and vomiting after your treatment, we encourage you to take your nausea medication as directed.  Daratumumab Injection What is this medication? DARATUMUMAB (dar a toom ue mab) treats multiple myeloma, a type of bone marrow cancer. It works by helping your immune system slow or stop the spread of cancer cells. It is a monoclonal antibody. This medicine may be used for other purposes; ask your health care provider or pharmacist if you have questions. COMMON BRAND NAME(S): DARZALEX What should I tell my care team before I take this medication? They need to know if you have any of these conditions: Hereditary fructose intolerance Infection, such as chickenpox, herpes, hepatitis B Lung or breathing disease, such as asthma, COPD An unusual or allergic reaction to daratumumab, sorbitol, other medications, foods, dyes, or preservatives Pregnant or trying to get pregnant Breastfeeding How should I use  this medication? This medication is injected into a vein. It is given by your care team in a hospital or clinic setting. Talk to your care team about the use of this medication in children. Special care may be needed. Overdosage: If you think you have taken too much of this medicine contact a poison control center or emergency room at once. NOTE: This medicine is only for you. Do not share this medicine with others. What if I miss a dose? Keep appointments for follow-up doses. It is important not to miss your dose. Call your care team if you are unable to keep an appointment. What may interact with this medication? Interactions have not been studied. This list may not describe all possible interactions. Give your health care provider a list of all the medicines, herbs, non-prescription drugs, or dietary supplements you use. Also tell them if you smoke, drink alcohol, or use illegal drugs. Some items may interact with your medicine. What should I watch for while using this medication? Your condition will be monitored carefully while you are receiving this medication. This medication can cause serious allergic reactions. To reduce your risk, your care team may give you other medication to take before receiving this one. Be sure to follow the directions from your care team. This medication can affect the results of blood tests to match your blood type. These changes can last for up to 6 months after the final dose. Your care team will do blood tests to match your blood type before you start treatment. Tell all of your care team that you are being treated  with this medication before receiving a blood transfusion. This medication can affect the results of some tests used to determine treatment response; extra tests may be needed to evaluate response. Talk to your care team if you wish to become pregnant or think you are pregnant. This medication can cause serious birth defects if taken during pregnancy and  for 3 months after the last dose. A reliable form of contraception is recommended while taking this medication and for 3 months after the last dose. Talk to your care team about effective forms of contraception. Do not breast-feed while taking this medication. What side effects may I notice from receiving this medication? Side effects that you should report to your care team as soon as possible: Allergic reactions--skin rash, itching, hives, swelling of the face, lips, tongue, or throat Infection--fever, chills, cough, sore throat, wounds that don't heal, pain or trouble when passing urine, general feeling of discomfort or being unwell Infusion reactions--chest pain, shortness of breath or trouble breathing, feeling faint or lightheaded Unusual bruising or bleeding Side effects that usually do not require medical attention (report to your care team if they continue or are bothersome): Constipation Diarrhea Fatigue Nausea Pain, tingling, or numbness in the hands or feet Swelling of the ankles, hands, or feet This list may not describe all possible side effects. Call your doctor for medical advice about side effects. You may report side effects to FDA at 1-800-FDA-1088. Where should I keep my medication? This medication is given in a hospital or clinic. It will not be stored at home. NOTE: This sheet is a summary. It may not cover all possible information. If you have questions about this medicine, talk to your doctor, pharmacist, or health care provider.  2023 Elsevier/Gold Standard (2022-04-08 00:00:00)   BELOW ARE SYMPTOMS THAT SHOULD BE REPORTED IMMEDIATELY: *FEVER GREATER THAN 100.4 F (38 C) OR HIGHER *CHILLS OR SWEATING *NAUSEA AND VOMITING THAT IS NOT CONTROLLED WITH YOUR NAUSEA MEDICATION *UNUSUAL SHORTNESS OF BREATH *UNUSUAL BRUISING OR BLEEDING *URINARY PROBLEMS (pain or burning when urinating, or frequent urination) *BOWEL PROBLEMS (unusual diarrhea, constipation, pain near  the anus) TENDERNESS IN MOUTH AND THROAT WITH OR WITHOUT PRESENCE OF ULCERS (sore throat, sores in mouth, or a toothache) UNUSUAL RASH, SWELLING OR PAIN  UNUSUAL VAGINAL DISCHARGE OR ITCHING   Items with * indicate a potential emergency and should be followed up as soon as possible or go to the Emergency Department if any problems should occur.  Please show the CHEMOTHERAPY ALERT CARD or IMMUNOTHERAPY ALERT CARD at check-in to the Emergency Department and triage nurse.  Should you have questions after your visit or need to cancel or reschedule your appointment, please contact [facility] [phone]  and follow the prompts.  Office hours are 8:00 a.m. to 4:30 p.m. Monday - Friday. Please note that voicemails left after 4:00 p.m. may not be returned until the following business day.  We are closed weekends and major holidays. You have access to a nurse at all times for urgent questions. Please call the main number to the clinic [phone] and follow the prompts.  For any non-urgent questions, you may also contact your provider using MyChart. We now offer e-Visits for anyone 35 and older to request care online for non-urgent symptoms. For details visit [email or web address].   Also download the MyChart app! Go to the app store, search "MyChart", open the app, select [facility], and log in with your MyChart username and password.

## 2023-03-02 NOTE — Progress Notes (Signed)
Pt present today for Dara [place] per provider's order. Vital signs and other labs WNL for treatment. Pt's ANC is 0.9 today, Dr.K made aware and stated to give GCSF- 480 mcg and proceed with treatment today.  Pt took pre-meds at home prior to arrival.  [facility] and Releuko 480 mcg injection given today per MD orders. Tolerated infusion without adverse affects. Vital signs stable. No complaints at this time. Discharged from clinic ambulatory in stable condition. Alert and oriented x 3. F/U with [facility] as scheduled.

## 2023-03-03 LAB — KAPPA/LAMBDA LIGHT CHAINS
Kappa free light chain: 1.9 mg/L — ABNORMAL LOW (ref 3.3–19.4)
Kappa, lambda light chain ratio: 1.12 (ref 0.26–1.65)
Lambda free light chains: 1.7 mg/L — ABNORMAL LOW (ref 5.7–26.3)

## 2023-03-05 LAB — PROTEIN ELECTROPHORESIS, SERUM
A/G Ratio: 1.9 — ABNORMAL HIGH (ref 0.7–1.7)
Albumin ELP: 3 g/dL (ref 2.9–4.4)
Alpha-1-Globulin: 0.3 g/dL (ref 0.0–0.4)
Alpha-2-Globulin: 0.5 g/dL (ref 0.4–1.0)
Beta Globulin: 0.7 g/dL (ref 0.7–1.3)
Gamma Globulin: 0.1 g/dL — ABNORMAL LOW (ref 0.4–1.8)
Globulin, Total: 1.6 g/dL — ABNORMAL LOW (ref 2.2–3.9)
Total Protein ELP: 4.6 g/dL — ABNORMAL LOW (ref 6.0–8.5)

## 2023-03-08 DIAGNOSIS — R059 Cough, unspecified: Secondary | ICD-10-CM | POA: Diagnosis not present

## 2023-03-08 DIAGNOSIS — R030 Elevated blood-pressure reading, without diagnosis of hypertension: Secondary | ICD-10-CM | POA: Diagnosis not present

## 2023-03-08 DIAGNOSIS — R509 Fever, unspecified: Secondary | ICD-10-CM | POA: Diagnosis not present

## 2023-03-08 DIAGNOSIS — Z6827 Body mass index (BMI) 27.0-27.9, adult: Secondary | ICD-10-CM | POA: Diagnosis not present

## 2023-03-08 DIAGNOSIS — Z20828 Contact with and (suspected) exposure to other viral communicable diseases: Secondary | ICD-10-CM | POA: Diagnosis not present

## 2023-03-10 LAB — IFE, DARA-SPECIFIC, SERUM
IgA: <5 mg/dL — ABNORMAL LOW (ref 61–437)
IgG (Immunoglobin G), Serum: 167 mg/dL — ABNORMAL LOW (ref 603–1613)
IgM (Immunoglobulin M), Srm: 9 mg/dL — ABNORMAL LOW (ref 15–143)

## 2023-03-16 ENCOUNTER — Inpatient Hospital Stay: Payer: Medicare Other

## 2023-03-19 ENCOUNTER — Other Ambulatory Visit: Payer: Self-pay | Admitting: Otolaryngology

## 2023-03-19 ENCOUNTER — Other Ambulatory Visit: Payer: Self-pay

## 2023-03-19 DIAGNOSIS — G4700 Insomnia, unspecified: Secondary | ICD-10-CM

## 2023-03-19 DIAGNOSIS — I10 Essential (primary) hypertension: Secondary | ICD-10-CM

## 2023-03-19 MED ORDER — POMALIDOMIDE 2 MG PO CAPS: ORAL_CAPSULE | ORAL | 0 refills | Status: DC

## 2023-03-19 NOTE — Telephone Encounter (Signed)
Chart reviewed. Pomalyst refilled per last office note with Dr. Katragadda.  

## 2023-03-23 ENCOUNTER — Inpatient Hospital Stay: Payer: Medicare Other

## 2023-03-23 ENCOUNTER — Inpatient Hospital Stay: Payer: Medicare Other | Admitting: Hematology

## 2023-03-24 ENCOUNTER — Inpatient Hospital Stay: Payer: Medicare Other

## 2023-03-24 ENCOUNTER — Encounter (HOSPITAL_COMMUNITY): Payer: Self-pay | Admitting: Otolaryngology

## 2023-03-24 NOTE — Progress Notes (Signed)
PCP - Dr Roberto Scales at [facility] in [place], DO Oncology - Dr Derek Jack  Chest x-ray - 01/19/23 (2V) EKG - DOS Stress Test - n/a ECHO - 09/14/17 CE Cardiac Cath - n/a  ICD Pacemaker/Loop - n/a  Sleep Study -  n/a CPAP - none  Diabetes - n/a  Blood Thinner Instructions: n/a  Aspirin Instructions: Follow your surgeon's instructions on when to stop aspirin prior to surgery,  If no instructions were given by your surgeon then you will need to call the office for those instructions.  ERAS: Clear liquids til 9:30 AM DOS.  Anesthesia review: Yes  STOP now taking any Aspirin (unless otherwise instructed by your surgeon), Aleve, Naproxen, Ibuprofen, Motrin, Advil, Goody's, BC's, all herbal medications, fish oil, and all vitamins.   Coronavirus Screening Do you have any of the following symptoms:  Cough yes/no: No Fever (>100.70F)  yes/no: No Runny nose Yes Sore throat yes/no: No Difficulty breathing/shortness of breath  yes/no: No  Have you traveled in the last 14 days and where? yes/no: No  Patient verbalized understanding of instructions that were given via phone.

## 2023-03-25 ENCOUNTER — Other Ambulatory Visit: Payer: Self-pay

## 2023-03-25 ENCOUNTER — Ambulatory Visit (HOSPITAL_COMMUNITY)
Admission: RE | Admit: 2023-03-25 | Discharge: 2023-03-25 | Disposition: A | Discharge status: Home / Self Care | Payer: Medicare Other | Attending: Otolaryngology | Admitting: Otolaryngology

## 2023-03-25 ENCOUNTER — Encounter (HOSPITAL_COMMUNITY)
Admission: RE | Disposition: A | Discharge status: Home / Self Care | Payer: Self-pay | Source: Home / Self Care | Attending: Otolaryngology

## 2023-03-25 ENCOUNTER — Ambulatory Visit (HOSPITAL_BASED_OUTPATIENT_CLINIC_OR_DEPARTMENT_OTHER): Payer: Medicare Other

## 2023-03-25 ENCOUNTER — Ambulatory Visit (HOSPITAL_COMMUNITY): Payer: Medicare Other

## 2023-03-25 DIAGNOSIS — Z7951 Long term (current) use of inhaled steroids: Secondary | ICD-10-CM | POA: Insufficient documentation

## 2023-03-25 DIAGNOSIS — Z923 Personal history of irradiation: Secondary | ICD-10-CM | POA: Diagnosis not present

## 2023-03-25 DIAGNOSIS — K219 Gastro-esophageal reflux disease without esophagitis: Secondary | ICD-10-CM | POA: Diagnosis not present

## 2023-03-25 DIAGNOSIS — I4891 Unspecified atrial fibrillation: Secondary | ICD-10-CM | POA: Insufficient documentation

## 2023-03-25 DIAGNOSIS — F419 Anxiety disorder, unspecified: Secondary | ICD-10-CM | POA: Diagnosis not present

## 2023-03-25 DIAGNOSIS — Z801 Family history of malignant neoplasm of trachea, bronchus and lung: Secondary | ICD-10-CM | POA: Diagnosis not present

## 2023-03-25 DIAGNOSIS — I129 Hypertensive chronic kidney disease with stage 1 through stage 4 chronic kidney disease, or unspecified chronic kidney disease: Secondary | ICD-10-CM | POA: Diagnosis not present

## 2023-03-25 DIAGNOSIS — Z87891 Personal history of nicotine dependence: Secondary | ICD-10-CM | POA: Insufficient documentation

## 2023-03-25 DIAGNOSIS — Z853 Personal history of malignant neoplasm of breast: Secondary | ICD-10-CM | POA: Diagnosis not present

## 2023-03-25 DIAGNOSIS — C9000 Multiple myeloma not having achieved remission: Secondary | ICD-10-CM | POA: Diagnosis not present

## 2023-03-25 DIAGNOSIS — I10 Essential (primary) hypertension: Secondary | ICD-10-CM | POA: Insufficient documentation

## 2023-03-25 DIAGNOSIS — J324 Chronic pansinusitis: Secondary | ICD-10-CM

## 2023-03-25 DIAGNOSIS — Z79899 Other long term (current) drug therapy: Secondary | ICD-10-CM | POA: Insufficient documentation

## 2023-03-25 DIAGNOSIS — G709 Myoneural disorder, unspecified: Secondary | ICD-10-CM

## 2023-03-25 DIAGNOSIS — Z7961 Long term (current) use of immunomodulator: Secondary | ICD-10-CM | POA: Insufficient documentation

## 2023-03-25 DIAGNOSIS — J320 Chronic maxillary sinusitis: Secondary | ICD-10-CM | POA: Insufficient documentation

## 2023-03-25 DIAGNOSIS — J329 Chronic sinusitis, unspecified: Secondary | ICD-10-CM | POA: Diagnosis not present

## 2023-03-25 DIAGNOSIS — D631 Anemia in chronic kidney disease: Secondary | ICD-10-CM | POA: Diagnosis not present

## 2023-03-25 DIAGNOSIS — N189 Chronic kidney disease, unspecified: Secondary | ICD-10-CM | POA: Diagnosis not present

## 2023-03-25 HISTORY — DX: Other amnesia: R41.3

## 2023-03-25 HISTORY — DX: Personal history of other medical treatment: Z92.89

## 2023-03-25 HISTORY — DX: Bronchitis, not specified as acute or chronic: J40

## 2023-03-25 HISTORY — DX: Pneumonia, unspecified organism: J18.9

## 2023-03-25 HISTORY — DX: Cough, unspecified: R05.9

## 2023-03-25 HISTORY — PX: SINUS ENDO WITH FUSION: SHX5329

## 2023-03-25 HISTORY — DX: Headache, unspecified: R51.9

## 2023-03-25 SURGERY — SURGERY, PARANASAL SINUS, ENDOSCOPIC, WITH NASAL SEPTOPLASTY, TURBINOPLASTY, AND MAXILLARY SINUSOTOMY
Anesthesia: General | Site: Nose | Laterality: Bilateral

## 2023-03-25 MED ORDER — CLINDAMYCIN PHOSPHATE 600 MG/50ML IV SOLN
600.0000 mg | Freq: Once | INTRAVENOUS | Status: AC
  Administered 2023-03-25: 600 mg via INTRAVENOUS
  Filled 2023-03-25: qty 50

## 2023-03-25 MED ORDER — CHLORHEXIDINE GLUCONATE 0.12 % MT SOLN
OROMUCOSAL | Status: AC
  Administered 2023-03-25: 15 mL via OROMUCOSAL
  Filled 2023-03-25: qty 15

## 2023-03-25 MED ORDER — LIDOCAINE-EPINEPHRINE 1 %-1:100000 IJ SOLN
INTRAMUSCULAR | Status: DC | PRN
  Administered 2023-03-25: 7 mL

## 2023-03-25 MED ORDER — OXYCODONE HCL 5 MG PO TABS: 5.0000 mg | ORAL_TABLET | Freq: Once | ORAL | Status: DC | PRN

## 2023-03-25 MED ORDER — ACETAMINOPHEN 500 MG PO TABS: 1000.0000 mg | ORAL_TABLET | Freq: Once | ORAL | Status: DC | PRN

## 2023-03-25 MED ORDER — EPINEPHRINE HCL (NASAL) 0.1 % NA SOLN
NASAL | Status: DC | PRN
  Administered 2023-03-25 (×2): 10 mL via TOPICAL

## 2023-03-25 MED ORDER — ACETAMINOPHEN 10 MG/ML IV SOLN: 1000.0000 mg | Freq: Once | INTRAVENOUS | Status: DC | PRN

## 2023-03-25 MED ORDER — MUPIROCIN 2 % EX OINT
TOPICAL_OINTMENT | CUTANEOUS | Status: AC
  Filled 2023-03-25: qty 22

## 2023-03-25 MED ORDER — PROPOFOL 10 MG/ML IV BOLUS
INTRAVENOUS | Status: DC | PRN
  Administered 2023-03-25: 100 mg via INTRAVENOUS

## 2023-03-25 MED ORDER — LIDOCAINE 2% (20 MG/ML) 5 ML SYRINGE
INTRAMUSCULAR | Status: DC | PRN
  Administered 2023-03-25: 60 mg via INTRAVENOUS

## 2023-03-25 MED ORDER — CHLORHEXIDINE GLUCONATE 0.12 % MT SOLN: 15.0000 mL | Freq: Once | OROMUCOSAL | Status: AC

## 2023-03-25 MED ORDER — LIDOCAINE-EPINEPHRINE 1 %-1:100000 IJ SOLN
INTRAMUSCULAR | Status: AC
  Filled 2023-03-25: qty 1

## 2023-03-25 MED ORDER — FENTANYL CITRATE (PF) 250 MCG/5ML IJ SOLN
INTRAMUSCULAR | Status: DC | PRN
  Administered 2023-03-25 (×2): 50 ug via INTRAVENOUS
  Administered 2023-03-25 (×2): 25 ug via INTRAVENOUS

## 2023-03-25 MED ORDER — SODIUM CHLORIDE 0.9 % IR SOLN
Status: DC | PRN
  Administered 2023-03-25 (×2): 1000 mL

## 2023-03-25 MED ORDER — FENTANYL CITRATE (PF) 100 MCG/2ML IJ SOLN: 25.0000 ug | INTRAMUSCULAR | Status: DC | PRN

## 2023-03-25 MED ORDER — PHENYLEPHRINE HCL-NACL 20-0.9 MG/250ML-% IV SOLN
INTRAVENOUS | Status: DC | PRN
  Administered 2023-03-25: 35 ug/min via INTRAVENOUS

## 2023-03-25 MED ORDER — LACTATED RINGERS IV SOLN: INTRAVENOUS | Status: DC

## 2023-03-25 MED ORDER — ROCURONIUM BROMIDE 10 MG/ML (PF) SYRINGE
PREFILLED_SYRINGE | INTRAVENOUS | Status: DC | PRN
  Administered 2023-03-25: 70 mg via INTRAVENOUS

## 2023-03-25 MED ORDER — PHENYLEPHRINE 80 MCG/ML (10ML) SYRINGE FOR IV PUSH (FOR BLOOD PRESSURE SUPPORT)
PREFILLED_SYRINGE | INTRAVENOUS | Status: DC | PRN
  Administered 2023-03-25: 160 ug via INTRAVENOUS

## 2023-03-25 MED ORDER — FLUORESCEIN SODIUM 1 MG OP STRP
ORAL_STRIP | OPHTHALMIC | Status: DC | PRN
  Administered 2023-03-25 (×2): 1

## 2023-03-25 MED ORDER — DEXAMETHASONE SODIUM PHOSPHATE 10 MG/ML IJ SOLN
INTRAMUSCULAR | Status: DC | PRN
  Administered 2023-03-25: 10 mg via INTRAVENOUS

## 2023-03-25 MED ORDER — OXYCODONE HCL 5 MG/5ML PO SOLN: 5.0000 mg | Freq: Once | ORAL | Status: DC | PRN

## 2023-03-25 MED ORDER — ACETAMINOPHEN 160 MG/5ML PO SOLN: 1000.0000 mg | Freq: Once | ORAL | Status: DC | PRN

## 2023-03-25 MED ORDER — AMOXICILLIN-POT CLAVULANATE 875-125 MG PO TABS: 1.0000 | ORAL_TABLET | Freq: Two times a day (BID) | ORAL | 0 refills | Status: AC

## 2023-03-25 MED ORDER — EPINEPHRINE HCL (NASAL) 0.1 % NA SOLN
NASAL | Status: AC
  Filled 2023-03-25: qty 90

## 2023-03-25 MED ORDER — PROPOFOL 500 MG/50ML IV EMUL
INTRAVENOUS | Status: DC | PRN
  Administered 2023-03-25: 125 ug/kg/min via INTRAVENOUS

## 2023-03-25 MED ORDER — HYDROCODONE-ACETAMINOPHEN 5-325 MG PO TABS: 1.0000 | ORAL_TABLET | Freq: Four times a day (QID) | ORAL | 0 refills | Status: AC | PRN

## 2023-03-25 MED ORDER — ORAL CARE MOUTH RINSE: 15.0000 mL | Freq: Once | OROMUCOSAL | Status: AC

## 2023-03-25 MED ORDER — FLUORESCEIN SODIUM 1 MG OP STRP
ORAL_STRIP | OPHTHALMIC | Status: AC
  Filled 2023-03-25: qty 2

## 2023-03-25 MED ORDER — FENTANYL CITRATE (PF) 250 MCG/5ML IJ SOLN
INTRAMUSCULAR | Status: AC
  Filled 2023-03-25: qty 5

## 2023-03-25 MED ORDER — 0.9 % SODIUM CHLORIDE (POUR BTL) OPTIME
TOPICAL | Status: DC | PRN
  Administered 2023-03-25: 1000 mL

## 2023-03-25 MED ORDER — SUGAMMADEX SODIUM 200 MG/2ML IV SOLN
INTRAVENOUS | Status: DC | PRN
  Administered 2023-03-25: 200 mg via INTRAVENOUS

## 2023-03-25 SURGICAL SUPPLY — 48 items
ANTIFOG SOL W/FOAM PAD STRL (MISCELLANEOUS) ×1
BAG COUNTER SPONGE SURGICOUNT (BAG) ×1 IMPLANT
BAG SPNG CNTER NS LX DISP (BAG)
BLADE RAD60 ROTATE M4 4 5PK (BLADE) IMPLANT
BLADE ROTATE RAD 40 4 M4 (BLADE) IMPLANT
BLADE ROTATE TRICUT 4X13 M4 (BLADE) ×1 IMPLANT
BUR TAPER CHOANAL ATRESIA 30K (BURR) IMPLANT
CANISTER SUCT 3000ML PPV (MISCELLANEOUS) ×2 IMPLANT
COAGULATOR SUCT 8FR VV (MISCELLANEOUS) IMPLANT
COAGULATOR SUCT SWTCH 10FR 6 (ELECTROSURGICAL) IMPLANT
DRESSING NASAL KENNEDY 3.5X.9 (MISCELLANEOUS) IMPLANT
DRSG NASAL KENNEDY 3.5X.9 (MISCELLANEOUS)
ELECT COATED BLADE 2.86 ST (ELECTRODE) IMPLANT
ELECT REM PT RETURN 9FT ADLT (ELECTROSURGICAL) ×1
ELECTRODE REM PT RTRN 9FT ADLT (ELECTROSURGICAL) ×1 IMPLANT
FILTER ARTHROSCOPY CONVERTOR (FILTER) IMPLANT
GLOVE BIO SURGEON STRL SZ7.5 (GLOVE) ×1 IMPLANT
GLOVE BIOGEL PI IND STRL 8 (GLOVE) ×1 IMPLANT
GOWN STRL REUS W/ TWL LRG LVL3 (GOWN DISPOSABLE) ×1 IMPLANT
GOWN STRL REUS W/ TWL XL LVL3 (GOWN DISPOSABLE) ×1 IMPLANT
GOWN STRL REUS W/TWL LRG LVL3 (GOWN DISPOSABLE) ×1
GOWN STRL REUS W/TWL XL LVL3 (GOWN DISPOSABLE) ×1
KIT BASIN OR (CUSTOM PROCEDURE TRAY) ×1 IMPLANT
KIT TURNOVER KIT B (KITS) ×1 IMPLANT
NDL PRECISIONGLIDE 27X1.5 (NEEDLE) ×1 IMPLANT
NDL SPNL 25GX3.5 QUINCKE BL (NEEDLE) ×1 IMPLANT
NEEDLE PRECISIONGLIDE 27X1.5 (NEEDLE) ×2 IMPLANT
NEEDLE SPNL 25GX3.5 QUINCKE BL (NEEDLE) ×1 IMPLANT
NS IRRIG 1000ML POUR BTL (IV SOLUTION) ×1 IMPLANT
PAD ARMBOARD 7.5X6 YLW CONV (MISCELLANEOUS) ×2 IMPLANT
PATTIES SURGICAL .5 X3 (DISPOSABLE) ×1 IMPLANT
PENCIL SMOKE EVACUATOR (MISCELLANEOUS) IMPLANT
SHEATH ENDOSCRUB 0 DEG (SHEATH) ×1 IMPLANT
SHEATH ENDOSCRUB 30 DEG (SHEATH) IMPLANT
SOLUTION ANTFG W/FOAM PAD STRL (MISCELLANEOUS) ×2 IMPLANT
SPLINT NASAL POSISEP X2 .8X2.3 (GAUZE/BANDAGES/DRESSINGS) IMPLANT
SUT ETHILON 3 0 FSL (SUTURE) IMPLANT
SUT PLAIN 4 0 ~~LOC~~ 1 (SUTURE) IMPLANT
SWAB COLLECTION DEVICE MRSA (MISCELLANEOUS) IMPLANT
SWAB CULTURE ESWAB REG 1ML (MISCELLANEOUS) IMPLANT
SYR 30ML LL (SYRINGE) ×1 IMPLANT
SYR 3ML LL SCALE MARK (SYRINGE) ×1 IMPLANT
TOWEL GREEN STERILE FF (TOWEL DISPOSABLE) ×1 IMPLANT
TRACKER ENT INSTRUMENT (MISCELLANEOUS) ×1 IMPLANT
TRACKER ENT PATIENT (MISCELLANEOUS) ×1 IMPLANT
TRAY ENT MC OR (CUSTOM PROCEDURE TRAY) ×1 IMPLANT
TUBE CONNECTING 12X1/4 (SUCTIONS) ×1 IMPLANT
TUBING STRAIGHTSHOT EPS 5PK (TUBING) ×1 IMPLANT

## 2023-03-25 NOTE — Discharge Instructions (Signed)
Endoscopic Sinus Surgery: Postoperative Care Instructions  Your active participation in the postoperative phase of treatment is critical to the success of your surgery. Please read and follow the instructions below.  Bleeding: It is normal to experience some nasal bleeding the afternoon/evening of surgery. If bleeding occurs, lean forward slightly and gently pinch the bottom 2/3 of your nose between your thumb and forefinger. Hold this for approximately 10 minutes. If you are still experiencing bleeding, you may apply Afrin (other options include neo-synephrine, 4 way nasal spray, etc) to your nose to shrink the blood vessels. Spray 3 puffs to the affected nostril(s) and then gently pinch the nose for 10 minutes. If this does not relieve the bleeding or if the bleeding is more significant, please call the hospital operator and ask for the ENT doctor on call. If extensive bleeding occurs, please call 911 or be seen at your closest emergency department. Medications:  Pain medication: The only mediation you will need to take the night of surgery is pain medication. If you wish a non-narcotic alternative, extra-strength Tylenol and ibuprofen are often sufficient.  Antibiotics/oral steroids: If you are prescribed either antibiotic or oral steroids, start these the day after surgery.   Nasal steroid spray: Start (or restart) your nasal steroid spray one week after surgery.   Aspirin: DO NOT take aspirin for at least three days after the surgery. Saline:  Moisture is critical to proper healing. You will need to moisturize and rinse your nose for at least 6 weeks after surgery. Nasal Mist: The day after surgery, start spraying your nose with saline mist spray. Apply 2-3 sprays to each nostril every one to two hours throughout the day. Common brands include: Ayr, Ocean, or Simply Saline. They are available over-the-counter at most pharmacies.  Nasal Irrigations: The day after surgery, begin sinus rinses twice a  day using the "NeilMed Sinus Rinse Kit"  (available for purchase at many pharmacies). Use gentle pressure when irrigating. If you have any discomfort with irrigation, reduce the amount of pressure you are applying or discontinue irrigations until the first post-operative visit.  Post-operative care: Do not blow your nose for the first week after surgery. You may sniff back. If you need to sneeze, do not suppress it but instead sneeze with your mouth open. After 1 week, you may blow your nose gently. No strenuous activity for one week after surgery. No straining or lifting more than 15 lbs.  You should not bend over at the waist to pick things up. Instead bend at the knees, with your head up. Light walking and normal household activities are acceptable immediately after surgery. You may resume exercise at 50% intensity after one week, and full intensity at two weeks. You may drive the day after surgery if you are not requiring narcotic pain medication. If you are taking antibiotics and experience stomach upset, active culture yogurt (Nancy's yogurt) or lactobacillus tablets (available at health food stores) on a daily basis may help.  If you develop diarrhea, stop your antibiotics and contact our office.  Persistent diarrhea may require further medical evaluation. You may eat a regular diet. You should plan on taking one week off from work and ideally have a half-day planned for your first day back. Do not fly without your doctor's clearance for 7 days after surgery. Post-operative visits: You will have frequent return visits to our office after surgery.  At each visit, a procedure called nasal endoscopy will be performed. The sinus cavities may be cleaned (  termed "debridement") in order to assure appropriate healing of the sinuses. Visits typically occur 1, 3, and 6 weeks after surgery. We recommend taking a dose of pain medication about 45 minutes to one hour before your first postoperative visit (as  long as someone can accompany you to your visit).  Post-operative visits are usually scheduled at the time of surgery scheduling, however if you have any questions about your post-operative visit you can contact our clinic at [phone]. Call our office if you experience any of the following: Fever higher than 101?F Clear, watery nasal drainage Any visual changes or marked swelling of the eyes Severe headache or neck stiffness Severe diarrhea Brisk bleeding  If you need to speak to a doctor after hours, please call the [facility] at [phone] and ask for the ENT doctor on call, or call 911 for emergencies.  

## 2023-03-25 NOTE — Anesthesia Preprocedure Evaluation (Addendum)
Anesthesia Evaluation  Patient identified by MRN, date of birth, ID band Patient awake    Reviewed: Allergy & Precautions, NPO status , Patient's Chart, lab work & pertinent test results  History of Anesthesia Complications Negative for: history of anesthetic complications  Airway Mallampati: III  TM Distance: >3 FB Neck ROM: Full    Dental  (+) Dental Advisory Given   Pulmonary neg shortness of breath, neg sleep apnea, neg COPD, neg recent URI   breath sounds clear to auscultation       Cardiovascular hypertension, Pt. on medications and Pt. on home beta blockers (-) angina (-) Past MI + dysrhythmias Atrial Fibrillation  Rhythm:Irregular     Neuro/Psych  Headaches  Anxiety     TIA Neuromuscular disease    GI/Hepatic Neg liver ROS,GERD  ,,  Endo/Other    Renal/GU CRFRenal diseaseLab Results      Component                Value               Date                      CREATININE               1.43 (H)            03/02/2023                Musculoskeletal  (+) Arthritis ,    Abdominal   Peds  Hematology  (+) Blood dyscrasia, anemia Lab Results      Component                Value               Date                      WBC                      1.6 (L)             03/02/2023                HGB                      9.5 (L)             03/02/2023                HCT                      29.3 (L)            03/02/2023                MCV                      105.8 (H)           03/02/2023                PLT                      143 (L)             03/02/2023              Anesthesia Other Findings Stage IV breast cancer; radiation and tamoxifen Overview: Left breast ca with mets  bone Overview: METS TO BONE  Reproductive/Obstetrics                             Anesthesia Physical Anesthesia Plan  ASA: 3  Anesthesia Plan: General   Post-op Pain Management: Ofirmev IV (intra-op)*   Induction:  Intravenous  PONV Risk Score and Plan: 2 and Ondansetron, Dexamethasone, Propofol infusion and TIVA  Airway Management Planned: Oral ETT  Additional Equipment: None  Intra-op Plan:   Post-operative Plan: Extubation in OR  Informed Consent: I have reviewed the patients History and Physical, chart, labs and discussed the procedure including the risks, benefits and alternatives for the proposed anesthesia with the patient or authorized representative who has indicated his/her understanding and acceptance.     Dental advisory given  Plan Discussed with: CRNA  Anesthesia Plan Comments: (New presentation of afib, asymptomatic with rate controlled)       Anesthesia Quick Evaluation

## 2023-03-25 NOTE — Anesthesia Procedure Notes (Signed)
Procedure Name: Intubation Date/Time: 03/25/2023 1:55 PM  Performed by: Dorann Lodge, CRNAPre-anesthesia Checklist: Patient identified, Emergency Drugs available, Suction available and Patient being monitored Patient Re-evaluated:Patient Re-evaluated prior to induction Oxygen Delivery Method: Circle System Utilized Preoxygenation: Pre-oxygenation with 100% oxygen Induction Type: IV induction Ventilation: Mask ventilation without difficulty and Oral airway inserted - appropriate to patient size Laryngoscope Size: Mac and 4 Grade View: Grade I Tube type: Oral Tube size: 7.5 mm Number of attempts: 1 Airway Equipment and Method: Stylet and Oral airway Placement Confirmation: ETT inserted through vocal cords under direct vision, positive ETCO2 and breath sounds checked- equal and bilateral Secured at: 21 cm Tube secured with: Tape Dental Injury: Teeth and Oropharynx as per pre-operative assessment

## 2023-03-25 NOTE — Op Note (Signed)
OPERATIVE NOTE  Johnny Lucas Date/Time of Admission: 03/25/2023  9:58 AM  CSN: [id number] Attending Provider: Jenetta Downer, MD Room/Bed: MCPO/NONE DOB: May 24, 1946 Age: 77 y.o.   Pre-Op Diagnosis: Chronic maxillary sinusitis  Post-Op Diagnosis: Chronic maxillary sinusitis  Procedure:  Bilateral maxillary antrostomy w tissue removal [id number] modifier 50) Total Ethmoidectomy ([id number] modifier 65) Bilateral Sphenoidotomy wo tissue removal: [id number] modifier 50 Frontal sinusotomy w removal of tissue - [id number] modifier 50 Stereotactic CT guided navigation (extradural) [id number]  Note:   Anesthesia: General  Surgeon(s): Pamala Hurry, MD  Staff: Circulator: Celene Squibb, RN Relief Scrub: Candie Mile Scrub Person: Barton Fanny E  Implants: * No implants in log *  Specimens: ID Type Source Tests Collected by Time Destination  1 : left sinus contents Tissue PATH Sinus Contents/Nasal Polyps SURGICAL PATHOLOGY Jenetta Downer, MD 03/25/2023 1417   2 : right sinus contents Tissue PATH Sinus Contents/Nasal Polyps SURGICAL PATHOLOGY Jenetta Downer, MD 03/25/2023 1417   A : right sinus pus Nasopharyngeal Sinus Contents AEROBIC/ANAEROBIC CULTURE W GRAM STAIN (SURGICAL/DEEP WOUND) Jenetta Downer, MD [id number]     Complications: none  EBL: 100 ML  IVF: Per anesthesia ML  Condition: stable  Operative Findings:  Mucopurulence and osteitic bone right maxillary sinus, anterior ethmoid and frontal sinus  Indications for Procedure: Johnny Lucas is a 77 y.o. M with a history of multiple myeloma, chronic rhinosinusitis recalcitrant to medical therapies confirmed with endoscopy and CT imaging. After discussing risks, benefits, and alternatives to the procedure, the patient elected to proceed.   Informed consent: Informed consent was obtained. Risks discussed in detail including pain, bleeding, infection, injury to the eye with associated vision changes, vision  loss, injury to the brain with associated CSF leak or meningitis, failure to improve or worsening of sense of smell, nasal synechiae, nasal septal perforation, need for further procedures, risks of general anesthesia. Despite these risks the patient requested to proceed with surgery.  Details of the Procedure:  The patient was identified in the pre-op area and brought back to the operating room and laid in the supine position. General anesthesia was induced and the patient was endotracheally intubated without complication. A surgical pause was then performed to identify the correct patient, procedure, and location. After all were in agreement, the head of the bed was rotated 180 degrees away from anesthesia.  The patient was prepped and draped in the standard clean fashion for endoscopic sinus surgery. The nose was decongested with 1:1000 topical epinephrine.  We began by setting up the computer aided CT stereotactic navigation. The reference array was placed about the patients forehead and calibrated to known anatomic landmarks. It was used throughout the procedure as indicated given the chronic disease process and need for dissection along the skull base and orbit.   All cottonoids were removed and set aside. The 0, 30, and 45 degree endoscopes were attached to the video monitor for endoscopic viewing as needed throughout the procedure, and our attention was directed to the right nasal cavity. Approximately 6cc of 1% with 1:100k were injected into the attachment of the middle turbinate as well as the sphenopalatine artery location. The middle turbinate was then deflected medially by a freer as a cottonoid soaked with 1:1000 epinephrine was inserted into the middle meatus. After this had time to take effect, the backbiter was then used to take down the uncinate, and the ball tipped probe was used to enter the maxillary sinus. The maxillary sinus ostium was widened anteriorly  using the back-biter, and the  edges were trimmed using thru-cut forceps and microdebrider. The sinus was copiously washed out with sterile saline. The 30 degree endoscope was used to confirm that the natural os was included in the antrostomy. The maxillary sinus was then widened to its anatomic limits (posterior maxillary wall, floor of the orbit, nasolacrimal duct, and concha of the inferior turbinate. The J curette was then used to enter the ethmoid bulla at an anterior inferior position and the anterior ethmoid tissue taken down with a combination of a currette and the microdebrider. The basal lamella was identified and entered thereby identifying the superior turbinate. All sinus disease was cleared out and the ant/post ethmoids and maxillary sinus irrigated with a copious amount of NS.   We then turned our attention to performing the right sphenoidotomy. The 0-degree endoscope was used to identify the superior turbinate, and the inferior 1/3 was taken down with thru cutting instrumentation. The true sphenoid ostium was cannulated and widened with a Kerrison rongeur and straight mushroom punch. All debris was removed from the sphenoid sinus.  We then proceeded with our posterior to anterior dissection. A 30 degree endoscope was used for visualization and through cutting instrumentation used to take down bony lamella along the skull base of the remaining posterior and anterior ethmoid air cells. The anterior ethmoid artery location was identified and preserved.   We then turned our attention to the right frontal sinus. The superior articulation of the uncinate was taken down with the cobra. The posterior wall of the agger nasi was removed with a front to back giraffe. The roof of the agger nasi was taken with a cobra. The right frontal sinus was then cannulated and maximized with the cobra and Hoseman frontal mushroom punch.   We then turned our attention to the left side. The middle turbinate was then deflected medially by a freer as  a cottonoid soaked with 1:1000 epinephrine was inserted into the middle meatus. After this had time to take effect, the backbiter was then used to take down the uncinate, and the ball tipped probe was used to enter the maxillary sinus. The maxillary sinus ostium was widened anteriorly using the back-biter, and the edges were trimmed using thru-cut forceps and microdebrider. The 30 degree endoscope was used to confirm that the natural os was included in the antrostomy. The maxillary sinus was then widened to its anatomic limits (posterior maxillary wall, floor of the orbit, nasolacrimal duct, and concha of the inferior turbinate. The J curette was then used to enter the ethmoid bulla at an anterior inferior position and the anterior ethmoid tissue taken down with a combination of a currette and the microdebrider. The basal lamella was identified and entered thereby identifying the superior turbinate. All sinus disease was cleared out and the ant/post ethmoids and maxillary sinus irrigated with a copious amount of NS.   We then turned our attention to performing the left sphenoidotomy. The 0-degree endoscope was used to identify the superior turbinate, and the inferior 1/3 was taken down with thru cutting instrumentation. The true sphenoid ostium was cannulated and widened with a kerrison ronguer and straight mushroom punch. All debris was removed from the sphenoid sinus.  We then proceeded with our posterior to anterior dissection. A 30 degree endoscope was used for visualization and through cutting instrumentation used to take down bony lamella along the skull base of the remaining posterior and anterior ethmoid air cells. The anterior ethmoid artery location was identified and preserved.  We then turned our attention to the left frontal sinus. The superior articulation of the uncinate was taken down with the cobra. The posterior wall of the agger nasi was removed with a front to back giraffe. The roof of the  agger nasi was taken with a cobra. The left frontal sinus was then cannulated and maximized with the cobra and Hoseman frontal mushroom punch.    All cottonoids were removed and hemostasis confirmed. The nasopharynx and oropharynx were suctioned free of blood and saline. Bilateral chitosan splints were placed in the middle meatus. A mustache dressing was then applied. The patient was then returned to the anesthetist, who awakened and extubated the patient without incident. The patient tolerated the procedure well without complications and was transferred to the recovery room in satisfactory condition.   Pamala Hurry, MD [facility]  03/25/2023

## 2023-03-25 NOTE — Transfer of Care (Signed)
Immediate Anesthesia Transfer of Care Note  Patient: Johnny Lucas  Procedure(s) Performed: FUNCTIONAL ENDOSCOPIC SINUS SURGERY WITH FUSION AND NAVIGATION (Bilateral: Nose)  Patient Location: PACU  Anesthesia Type:General  Level of Consciousness: awake and patient cooperative  Airway & Oxygen Therapy: Patient Spontanous Breathing and Patient connected to face mask oxygen  Post-op Assessment: Report given to RN, Post -op Vital signs reviewed and stable, and Patient moving all extremities  Post vital signs: Reviewed and stable  Last Vitals:  Vitals Value Taken Time  BP 130/92 03/25/23 1550  Temp 36.3 C 03/25/23 1550  Pulse 80 03/25/23 1552  Resp 17 03/25/23 1552  SpO2 94 % 03/25/23 1552  Vitals shown include unvalidated device data.  Last Pain:  Vitals:   03/25/23 1047  PainSc: 0-No pain         Complications: No notable events documented.

## 2023-03-25 NOTE — H&P (Addendum)
Johnny Lucas is an 77 y.o. male.    Chief Complaint:  Chronic sinusitis  HPI: Patient presents today for planned elective procedure.  He/she denies any interval change in history since office visit on 01/18/23.   Past Medical History:  Diagnosis Date   Anxiety    Bone metastases 09/10/2016   Breast cancer, male [facility]) 2011   Stage IV breast cancer; radiation and tamoxifen  Overview:  Left breast ca with mets bone  Overview:  METS TO BONE   Bronchitis    uses inhaler   Cough    uses inhaler   GERD (gastroesophageal reflux disease)    Headache    History of blood transfusion    Hypertension    Macular degeneration    bilateral   Memory loss    mild   Multiple myeloma ([place]) 02/18/2017   Peripheral neuropathy    feet   Pneumonia    mild case 1-2 yrs ago (2023 or 2024)    Past Surgical History:  Procedure Laterality Date   BACK SURGERY     C2-C3 fusion   BREAST BIOPSY Left    cancer   CATARACT EXTRACTION W/PHACO Left 02/03/2019   Procedure: CATARACT EXTRACTION PHACO AND INTRAOCULAR LENS PLACEMENT ([place]);  Surgeon: Baruch Goldmann, MD;  Location: [facility];  Service: Ophthalmology;  Laterality: Left;  CDE: 15.89   CATARACT EXTRACTION W/PHACO Right 07/25/2021   Procedure: CATARACT EXTRACTION PHACO AND INTRAOCULAR LENS PLACEMENT ([facility]);  Surgeon: Baruch Goldmann, MD;  Location: [facility];  Service: Ophthalmology;  Laterality: Right;  CDE   26.71   COLONOSCOPY     HERNIA REPAIR     PORTACATH PLACEMENT Left 06/10/2018   Procedure: INSERTION PORT-A-CATH;  Surgeon: Virl Cagey, MD;  Location: [facility];  Service: General;  Laterality: Left;   right arm surgery     bone cancer and rod placed    Family History  Problem Relation Age of Onset   Stroke Mother    Cancer Maternal Aunt        cancer NOS; died in her 49s   Lung cancer Maternal Uncle        smoker    Social History:  reports that he has never smoked. He has quit using smokeless tobacco.  His smokeless tobacco use  included chew. He reports current alcohol use of about 24.0 standard drinks of alcohol per week. He reports that he does not use drugs.  Allergies:  Allergies  Allergen Reactions   Cefepime     Suspected severe thrombocytopenia is a result of cefepime induced antigen platelet destruction    Medications Prior to Admission  Medication Sig Dispense Refill   acetaminophen (TYLENOL) 500 MG tablet Take 1,000 mg by mouth daily.     acyclovir (ZOVIRAX) 800 MG tablet Take 1 tablet (800 mg total) by mouth 2 (two) times daily. 180 tablet 3   albuterol (VENTOLIN HFA) 108 (90 Base) MCG/ACT inhaler Inhale 2 puffs into the lungs every 6 (six) hours as needed for wheezing or shortness of breath.     ALPRAZolam (XANAX) 0.5 MG tablet TAKE 1 TABLET BY MOUTH AT BEDTIME AS NEEDED FOR anxiety / SLEEP 30 tablet 5   aspirin EC 81 MG tablet Take 81 mg by mouth daily.      denosumab (XGEVA) 120 MG/1.7ML SOLN injection Inject 120 mg into the skin every 30 (thirty) days.      folic acid (FOLVITE) 1 MG tablet TAKE 1 TABLET BY MOUTH DAILY 90 tablet  2   gabapentin (NEURONTIN) 300 MG capsule TAKE ONE CAPSULE BY MOUTH THREE TIMES DAILY 180 capsule 2   metoprolol succinate (TOPROL-XL) 100 MG 24 hr tablet Take 100 mg by mouth in the morning.     pomalidomide (POMALYST) 2 MG capsule TAKE 1 CAPSULE BY MOUTH EVERY DAY FOR 21 DAYS ON FOLLOWED BY 7 DAYS OFF 21 capsule 0   sildenafil (VIAGRA) 100 MG tablet 100 mg as needed for erectile dysfunction.     tamoxifen (NOLVADEX) 20 MG tablet TAKE 1 TABLET EVERY DAY 90 tablet 3   budesonide (PULMICORT) 0.5 MG/2ML nebulizer solution Add 2 mL (1 vial) of budesonide into 240 mL saline irrigation bottle and irrigate both nostrils morning and night (one bottle total per day).     Loperamide HCl (IMODIUM PO) Take 2 mg by mouth daily as needed (diarrhea).     loratadine (CLARITIN) 10 MG tablet Take 1 tablet (10 mg total) by mouth daily. (Patient not taking: Reported on 03/23/2023) 90 tablet 6    ondansetron (ZOFRAN) 8 MG tablet TAKE 1 TABLET BY MOUTH EVERY 8 HOURS AS NEEDED FOR NAUSEA AND VOMITING (Patient not taking: Reported on 03/23/2023) 30 tablet 2   pantoprazole (PROTONIX) 40 MG tablet TAKE 1 TABLET EVERY OTHER DAY 45 tablet 6   polyethylene glycol (MIRALAX / GLYCOLAX) packet Take 17 g by mouth daily as needed for mild constipation. 14 each 0    No results found. However, due to the size of the patient record, not all encounters were searched. Please check Results Review for a complete set of results. No results found.  ROS: negative other than stated in HPI  Blood pressure 124/74, pulse 65, temperature 98.8 F (37.1 C), resp. rate 17, height 5\' 9"  (1.753 m), weight 86.2 kg, SpO2 99 %.  PHYSICAL EXAM: General: Resting comfortably in NAD  Lungs: Non-labored respiratinos  Studies Reviewed: CT Sinus 11/16/2022  FINDINGS:  Paranasal sinuses:   Frontal: Complete opacification of the right frontal sinus and  corresponding drainage pathway. Mucosal thickening of the inferior  aspect of the left frontal sinus with narrowing of the frontal  ethmoidal recess.   Ethmoid: Opacification of right anterior ethmoid cells. Mucosal  thickening of left anterior ethmoid cells.   Maxillary: Complete opacification of the right maxillary sinus mild  mucosal thickening of the left maxillary sinus.   Sphenoid: Mucosal thickening of the anterior aspect of the right  sphenoid sinus with a obstruction of the sphenoethmoidal recess. The  left sphenoid sinus is well aerated.   Right ostiomeatal unit: Obstructed by soft tissue density.   Left ostiomeatal unit: Narrowed by mucosal thickening.   Nasal passages: Obstruction of the right middle meatus by soft  tissue density. Mucosal thickening versus small amount of fluid in  the inferior meatus bilaterally. Mild bowing of the nasal septum to  the left.   Anatomy: There is pneumatization superior to both anterior ethmoid  notches.  Symmetric olfactory grooves and fovea ethmoidalis, Keros II  (4-60mm). Sellar sphenoid pneumatization pattern.   Other: Technique does not allow for diagnostic visualization of the  orbital, intracranial, and soft tissue structures.   IMPRESSION:  Inflammatory paranasal sinus disease worse on the right side with  complete opacification of the right frontal and maxillary sinuses  and right anterior ethmoid cells as well as right ostiomeatal unit.    Electronically Signed    By: Pedro Earls M.D.    On: 11/17/2022 11:36    Assessment/Plan Chronic bacterial rhino-sinusitis  Multiple myeloma  Proceed with bilateral complete FESS.   Informed consent obtained. Risks discussed in detail including pain, bleeding, infection, injury to the eye with associated vision changes, vision loss, injury to the brain with associated CSF leak or meningitis, failure to improve or worsening of sense of smell, nasal synechiae, nasal septal perforation, need for further procedures, risks of general anesthesia.    Electronically signed by:  Jenetta Downer, MD  Staff Physician Facial Plastic & Reconstructive Surgery Otolaryngology - Head and Neck Surgery [facility], [place]  03/25/2023, 12:46 PM  Addendum:  Incidental asymptomatic atrial fibrillation captured on pre-op EKG by anesthesia. After discussing with patient, anesthesia attending, patient's son, the patient accepts the risks of general anesthesia, including the elevated risk with occult, untreated AF.   Anticipate outpatient cardiology evaluation and subsequent treatment after surgery.  Surgery is indicated given ongoing bacterial infection of the patient's sinuses recalcitrant to abx therapy and the risk of progressive infection including intracranial and orbital complications of bacterial sinusitis.  Electronically signed by:  Jenetta Downer, MD  Staff  Physician Facial Plastic & Reconstructive Surgery Otolaryngology - Head and Neck Surgery [place], [place]

## 2023-03-25 NOTE — Progress Notes (Signed)
Patient's EKG showed Atrial Fibrillation.  Patient stated he has not had this before.  Prior EKG in 2019 was ST.  Dr. Ermalene Postin made aware.    Patient drank a Boost at home at 0730.  Dr. Ermalene Postin, Hurley Cisco, and Buffy made aware.  Buffy was going to make Dr. Sabino Gasser aware.

## 2023-03-26 ENCOUNTER — Encounter (HOSPITAL_COMMUNITY): Payer: Self-pay | Admitting: Otolaryngology

## 2023-03-26 NOTE — Anesthesia Postprocedure Evaluation (Signed)
Anesthesia Post Note  Patient: RASOOL PENNEL  Procedure(s) Performed: FUNCTIONAL ENDOSCOPIC SINUS SURGERY WITH FUSION AND NAVIGATION (Bilateral: Nose)     Patient location during evaluation: PACU Anesthesia Type: General Level of consciousness: awake and patient cooperative Pain management: pain level controlled Vital Signs Assessment: post-procedure vital signs reviewed and stable Respiratory status: spontaneous breathing, nonlabored ventilation and respiratory function stable Cardiovascular status: blood pressure returned to baseline and stable Postop Assessment: no apparent nausea or vomiting Anesthetic complications: no   No notable events documented.  Last Vitals:  Vitals:   03/25/23 1615 03/25/23 1626  BP: (!) 116/102 (!) 121/99  Pulse: 76 69  Resp: 15 13  Temp:  (!) 36.3 C  SpO2: 99% 98%    Last Pain:  Vitals:   03/25/23 1626  PainSc: 0-No pain                 Joshuwa Vecchio

## 2023-03-29 LAB — SURGICAL PATHOLOGY

## 2023-03-29 NOTE — Progress Notes (Signed)
[facility] [place] [address] [place] [address]    Clinic Day:  03/30/2023  Referring physician: Derek Jack, MD  Patient Care Team: Derek Jack, MD as PCP - General (Hematology) Derek Jack, MD as Consulting Physician (Oncology)   ASSESSMENT & PLAN:   Assessment: 1.  Relapsed multiple myeloma: -Autologous stem cell transplant on 10/08/2017 -Post transplant consolidation with CyCarD for 2 cycles with M spike undetectable. -Maintenance therapy with carfilzomib 70 mg per metered square days 1 and 15 every 28 days from 04/20/2018, dose reduced to 35 mg per metered square on 01/05/2019, titrated up to 56 mg per metered square on 01/09/2020. 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 -PET scan on 02/18/2020 showed new left supraclavicular lymph node at the thoracic inlet, SUV 14.2.  Multifocal osseous lesions largely improved, though residual lesions noted including right acromion, left acromion, thyroid cartilage, right sternum.  Additional lesions throughout the sternum, bilateral ribs, thoracolumbar spine, bilateral pelvis have resolved. -BMBX on 02/14/2020 shows plasma cell myeloma, 60-70% of cells.  FISH for myeloma was negative.  Cytogenetics was normal. -4 cycles of daratumumab, bortezomib and dexamethasone from 02/28/2020 through 04/29/2020. -Daratumumab, pomalidomide and dexamethasone started on 05/28/2020.  He is receiving daratumumab every 2 weeks, Pomalyst 3 mg days 1-21, dexamethasone 20 mg weekly. -Pomalidomide dose reduced to 2 mg days 1-21 around the first week of July 2021, due to severe weakness.   Plan: 1.  Relapsed IgG kappa multiple myeloma: - Pomalidomide was held as he had sinus surgery on 03/25/2023. - He is on Augmentin since the surgery to take for 1 week. - He reportedly had an EKG at the time of surgery which showed?  A-fib. - Today his heart rate is in complete normal sinus rhythm  with a rate of 60 to 70/min. - Reviewed myeloma panel from 03/02/2023.  M spike is not detected.  Free light chain ratio is 1.12. - He feels fine today.  Labs show creatinine 1.39.  White count is 3.4 with ANC of 1.4.  He started back on Pomalyst yesterday. - Continue Pomalyst 2 mg 3 weeks on/1 week off.  Continue monthly daratumumab.   2.  Myeloma bone disease: - Continue denosumab monthly.  Continue calcium supplements.   3.  Macrocytic anemia: - Anemia from chronic inflammation and CKD. - Ferritin was 161, percent saturation is 11. - Continue Retacrit 20,000 units as needed to keep hemoglobin above 9.  Will also consider parenteral iron therapy.   4.  ID prophylaxis: - Continue acyclovir twice daily.   5.  Peripheral neuropathy: - Continue gabapentin 600 mg 3 times daily.   6.  History of breast cancer: - Continue tamoxifen daily.  7.  Hypogammaglobulinemia: - He has severely low IgG levels.  He did not have any other infections other than chronic sinusitis.  If there is any other infections, consider IVIG.  No orders of the defined types were placed in this encounter.     I,Katie Daubenspeck,acting as a Education administrator for Derek Jack, MD.,have documented all relevant documentation on the behalf of Derek Jack, MD,as directed by  Derek Jack, MD while in the presence of Derek Jack, MD.   I, Derek Jack MD, have reviewed the above documentation for accuracy and completeness, and I agree with the above.   Derek Jack, MD   4/2/20245:09 PM  CHIEF COMPLAINT:   Diagnosis: multiple myeloma    Cancer Staging  Multiple myeloma Staging form: Plasma Cell Myeloma  and Plasma Cell Disorders, AJCC 8th Edition - Clinical stage from 05/03/2017: Beta-2-microglobulin (mg/L): 3.5, Albumin (g/dL): 3.4, ISS: Stage II, High-risk cytogenetics: Absent, LDH: Not assessed - Signed by Baird Cancer, PA-C on 05/03/2017    Prior Therapy: 1. RVD x 6 cycles  from 03/01/2017 to 06/29/2017. 2. Stem cell transplant on 09/30/2017. 3. Carfilzomib and cyclophosphamide x 2 cycles from 02/14/2018 to 03/29/2018. 4. Carfilzomib x 22 cycles from 04/20/2018 to 01/23/2020.  Current Therapy:  Darzalex Faspro monthly; Pomalyst 2 mg 3/4 weeks; Xgeva monthly    HISTORY OF PRESENT ILLNESS:   Oncology History  Breast cancer, male  12/31/2009 Initial Biopsy   Biopsy of L breast    12/31/2009 Pathology Results   Invasive ductal carcinoma, ER/PR+, HER 2 negative   12/31/2009 Imaging   Ultrasound showing a 2.43 x 1.85 x 3 cm hypoechoic spiculated mass in the 12 o clock L breast retroareolar region   01/01/2010 -  Anti-estrogen oral therapy   Tamoxifen 20 mg daily   01/06/2010 Imaging   Bone scan abnormal uptake in the diaphysis of the R humerus, abnormal in the R third, fifth and sixth ribs, lesion also noted in the sternum.   02/03/2010 Surgery   Rod placement and fixation of R humerus by Dr. [name]   02/05/2010 - 02/18/2010 Radiation Therapy   30Gy in 10 fractions of 3 Gy per fraction to R pathologic fracture   03/11/2010 -  Chemotherapy   Denosumab monthly, now every 3 months. Started at [facility]    06/09/2016 Imaging   Three hypermetabolic osseous lesions in the sternum, left ilium and right ilium, as discussed above, likely represent osseous metastases. At this time, these are not recognizable on the CT images. 2. No extra skeletal metastatic disease identified in the neck, chest, abdomen or pelvis.   10/13/2016 Progression   PET shows various new and enlarging osseous metastatic lesions with no definite extra osseous metastatic disease currently identified.    12/31/2016 Progression   1. Multifocal hypermetabolic osseous metastases throughout the axial and proximal appendicular skeleton, which are increased in size, number and metabolism since 10/13/2016 PET-CT. 2. 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 3. No additional sites of hypermetabolic metastatic disease. 4. Chronic right mastoid sinusitis. 5. Aortic atherosclerosis.  One vessel coronary atherosclerosis.   Multiple myeloma  02/12/2017 Bone Marrow Biopsy   The marrow was variably cellular with large peritrabecular aggregates of kappa restricted plasma cells (66% by aspirate, 30% by Cd138). Cytogenetics +11.    03/01/2017 - 06/29/2017 Chemotherapy   RVD    05/26/2017 Bone Marrow Biopsy   Performed at [facility]:  Plasma cell myeloma in a 30% cellular marrow with decreased trilineage hematopoiesis and 42% kappa light chain restricted plasma cells on the aspirate smears and large aggregates on the core biopsy.     07/12/2017 - 09/01/2017 Chemotherapy   3 cycles of carfizolmib/cyclophosphamide/dexamethasone     10/08/2017 Bone Marrow Transplant   [facility]otransplant at [facility]   02/28/2020 - 09/01/2022 Chemotherapy   Patient is on Treatment Plan : Daratumumab q28d     09/01/2022 -  Chemotherapy   Patient is on Treatment Plan : MYELOMA Daratumumab [place] q28d     Multiple myeloma not having achieved remission  06/29/2017 Initial Diagnosis   Multiple myeloma not having achieved remission ([place])   02/28/2020 - 09/01/2022 Chemotherapy   Patient is on  Treatment Plan : Daratumumab q28d     09/01/2022 -  Chemotherapy   Patient is on Treatment Plan : MYELOMA Daratumumab Faspro Subq q28d        INTERVAL HISTORY:   Johnny Lucas is a 77 y.o. male presenting to clinic today for follow up of multiple myeloma. He was last seen by me on 12/01/22.  Since his last visit, he was seen in the ED on 01/19/23 for neutropenia. A chest x-ray showed: tiny effusions and adjacent atelectasis; subtle nodular areas along left lung base.  Of note, he also underwent sinus surgery on 03/25/23 under Dr. Sabino Gasser for chronic pansinusitis.  Today, he states that he is doing well  overall. His appetite level is at 70%. His energy level is at 60%.  PAST MEDICAL HISTORY:   Past Medical History: Past Medical History:  Diagnosis Date   Anxiety    Bone metastases 09/10/2016   Breast cancer, male [facility]) 2011   Stage IV breast cancer; radiation and tamoxifen  Overview:  Left breast ca with mets bone  Overview:  METS TO BONE   Bronchitis    uses inhaler   Cough    uses inhaler   GERD (gastroesophageal reflux disease)    Headache    History of blood transfusion    Hypertension    Macular degeneration    bilateral   Memory loss    mild   Multiple myeloma ([place]) 02/18/2017   Peripheral neuropathy    feet   Pneumonia    mild case 1-2 yrs ago (2023 or 2024)    Surgical History: Past Surgical History:  Procedure Laterality Date   BACK SURGERY     C2-C3 fusion   BREAST BIOPSY Left    cancer   CATARACT EXTRACTION W/PHACO Left 02/03/2019   Procedure: CATARACT EXTRACTION PHACO AND INTRAOCULAR LENS PLACEMENT ([place]);  Surgeon: Baruch Goldmann, MD;  Location: [facility];  Service: Ophthalmology;  Laterality: Left;  CDE: 15.89   CATARACT EXTRACTION W/PHACO Right 07/25/2021   Procedure: CATARACT EXTRACTION PHACO AND INTRAOCULAR LENS PLACEMENT ([facility]);  Surgeon: Baruch Goldmann, MD;  Location: [facility];  Service: Ophthalmology;  Laterality: Right;  CDE   26.71   COLONOSCOPY     HERNIA REPAIR     PORTACATH PLACEMENT Left 06/10/2018   Procedure: INSERTION PORT-A-CATH;  Surgeon: Virl Cagey, MD;  Location: [facility];  Service: General;  Laterality: Left;   right arm surgery     bone cancer and rod placed   SINUS ENDO WITH FUSION Bilateral 03/25/2023   Procedure: FUNCTIONAL ENDOSCOPIC SINUS SURGERY WITH FUSION AND NAVIGATION;  Surgeon: Jenetta Downer, MD;  Location: [facility];  Service: ENT;  Laterality: Bilateral;    Social History: Social History   Socioeconomic History   Marital status: Married    Spouse name: Not on file   Number of children: 3   Years of education: Not on  file   Highest education level: Not on file  Occupational History   Not on file  Tobacco Use   Smoking status: Never   Smokeless tobacco: Former    Types: Nurse, children's Use: Never used  Substance and Sexual Activity   Alcohol use: Yes    Alcohol/week: 24.0 standard drinks of alcohol    Types: 24 Cans of beer per week    Comment: No ETOH in the last 6 months as of 02/2023   Drug use: No   Sexual activity: Yes  Other Topics Concern  Not on file  Social History Narrative   Not on file   Social Determinants of Health   Financial Resource Strain: Low Risk  (11/26/2020)   Overall Financial Resource Strain (CARDIA)    Difficulty of Paying Living Expenses: Not hard at all  Food Insecurity: No Food Insecurity (11/26/2020)   Hunger Vital Sign    Worried About Running Out of Food in the Last Year: Never true    Ran Out of Food in the Last Year: Never true  Transportation Needs: No Transportation Needs (11/26/2020)   PRAPARE - Hydrologist (Medical): No    Lack of Transportation (Non-Medical): No  Physical Activity: Inactive (11/26/2020)   Exercise Vital Sign    Days of Exercise per Week: 0 days    Minutes of Exercise per Session: 0 min  Stress: Stress Concern Present (11/26/2020)   [place]    Feeling of Stress : To some extent  Social Connections: Moderately Integrated (11/26/2020)   Social Connection and Isolation Panel [NHANES]    Frequency of Communication with Friends and Family: More than three times a week    Frequency of Social Gatherings with Friends and Family: Three times a week    Attends Religious Services: 1 to 4 times per year    Active Member of Clubs or Organizations: No    Attends Archivist Meetings: Never    Marital Status: Married  Human resources officer Violence: Not At Risk (11/26/2020)   Humiliation, Afraid, Rape, and Kick questionnaire     Fear of Current or Ex-Partner: No    Emotionally Abused: No    Physically Abused: No    Sexually Abused: No    Family History: Family History  Problem Relation Age of Onset   Stroke Mother    Cancer Maternal Aunt        cancer NOS; died in her 33s   Lung cancer Maternal Uncle        smoker    Current Medications:  Current Outpatient Medications:    acetaminophen (TYLENOL) 500 MG tablet, Take 1,000 mg by mouth daily., Disp: , Rfl:    acyclovir (ZOVIRAX) 800 MG tablet, Take 1 tablet (800 mg total) by mouth 2 (two) times daily., Disp: 180 tablet, Rfl: 3   albuterol (VENTOLIN HFA) 108 (90 Base) MCG/ACT inhaler, Inhale 2 puffs into the lungs every 6 (six) hours as needed for wheezing or shortness of breath., Disp: , Rfl:    ALPRAZolam (XANAX) 0.5 MG tablet, TAKE 1 TABLET BY MOUTH AT BEDTIME AS NEEDED FOR anxiety / SLEEP, Disp: 30 tablet, Rfl: 5   amoxicillin-clavulanate (AUGMENTIN) 875-125 MG tablet, Take 1 tablet by mouth 2 (two) times daily for 7 days., Disp: 14 tablet, Rfl: 0   aspirin EC 81 MG tablet, Take 81 mg by mouth daily. , Disp: , Rfl:    budesonide (PULMICORT) 0.5 MG/2ML nebulizer solution, Add 2 mL (1 vial) of budesonide into 240 mL saline irrigation bottle and irrigate both nostrils morning and night (one bottle total per day)., Disp: , Rfl:    denosumab (XGEVA) 120 MG/1.7ML SOLN injection, Inject 120 mg into the skin every 30 (thirty) days. , Disp: , Rfl:    folic acid (FOLVITE) 1 MG tablet, TAKE 1 TABLET BY MOUTH DAILY, Disp: 90 tablet, Rfl: 2   gabapentin (NEURONTIN) 300 MG capsule, TAKE ONE CAPSULE BY MOUTH THREE TIMES DAILY, Disp: 180 capsule, Rfl: 2   HYDROcodone-acetaminophen (NORCO/VICODIN)  5-325 MG tablet, Take 1 tablet by mouth every 6 (six) hours as needed for up to 5 days (Pain not relieved by Tylenol or ibuprofen)., Disp: 5 tablet, Rfl: 0   Loperamide HCl (IMODIUM PO), Take 2 mg by mouth daily as needed (diarrhea)., Disp: , Rfl:    loratadine (CLARITIN) 10 MG  tablet, Take 1 tablet (10 mg total) by mouth daily., Disp: 90 tablet, Rfl: 6   metoprolol succinate (TOPROL-XL) 100 MG 24 hr tablet, Take 100 mg by mouth in the morning., Disp: , Rfl:    pantoprazole (PROTONIX) 40 MG tablet, TAKE 1 TABLET EVERY OTHER DAY, Disp: 45 tablet, Rfl: 6   polyethylene glycol (MIRALAX / GLYCOLAX) packet, Take 17 g by mouth daily as needed for mild constipation., Disp: 14 each, Rfl: 0   pomalidomide (POMALYST) 2 MG capsule, TAKE 1 CAPSULE BY MOUTH EVERY DAY FOR 21 DAYS ON FOLLOWED BY 7 DAYS OFF, Disp: 21 capsule, Rfl: 0   sildenafil (VIAGRA) 100 MG tablet, 100 mg as needed for erectile dysfunction., Disp: , Rfl:    tamoxifen (NOLVADEX) 20 MG tablet, TAKE 1 TABLET EVERY DAY, Disp: 90 tablet, Rfl: 3   ondansetron (ZOFRAN) 8 MG tablet, TAKE 1 TABLET BY MOUTH EVERY 8 HOURS AS NEEDED FOR NAUSEA AND VOMITING, Disp: 30 tablet, Rfl: 2 No current facility-administered medications for this visit.  Facility-Administered Medications Ordered in Other Visits:    ondansetron (ZOFRAN) 8 mg in sodium chloride 0.9 % 50 mL IVPB, 8 mg, Intravenous, Once, Lockamy, Randi L, NP-C   sodium chloride flush (NS) 0.9 % injection 10 mL, 10 mL, Intracatheter, Once PRN, Derek Jack, MD   sodium chloride flush (NS) 0.9 % injection 10 mL, 10 mL, Intravenous, PRN, Derek Jack, MD, 10 mL at 03/30/23 1426   Allergies: Allergies  Allergen Reactions   Cefepime     Suspected severe thrombocytopenia is a result of cefepime induced antigen platelet destruction    REVIEW OF SYSTEMS:   Review of Systems  Constitutional:  Negative for chills, fatigue and fever.  HENT:   Negative for lump/mass, mouth sores, nosebleeds, sore throat and trouble swallowing.   Eyes:  Negative for eye problems.  Respiratory:  Negative for cough and shortness of breath.   Cardiovascular:  Negative for chest pain, leg swelling and palpitations.  Gastrointestinal:  Negative for abdominal pain, constipation,  diarrhea, nausea and vomiting.  Genitourinary:  Negative for bladder incontinence, difficulty urinating, dysuria, frequency, hematuria and nocturia.   Musculoskeletal:  Negative for arthralgias, back pain, flank pain, myalgias and neck pain.  Skin:  Negative for itching and rash.  Neurological:  Positive for dizziness, headaches and numbness.  Hematological:  Does not bruise/bleed easily.  Psychiatric/Behavioral:  Positive for sleep disturbance. Negative for depression and suicidal ideas. The patient is not nervous/anxious.   All other systems reviewed and are negative.    VITALS:   There were no vitals taken for this visit.  Wt Readings from Last 3 Encounters:  03/30/23 196 lb 6.4 oz (89.1 kg)  03/25/23 190 lb (86.2 kg)  03/02/23 195 lb 1.6 oz (88.5 kg)    There is no height or weight on file to calculate BMI.  Performance status (ECOG): 1 - Symptomatic but completely ambulatory  PHYSICAL EXAM:   Physical Exam Vitals and nursing note reviewed. Exam conducted with a chaperone present.  Constitutional:      Appearance: Normal appearance.  Cardiovascular:     Rate and Rhythm: Normal rate and regular rhythm.  Pulses: Normal pulses.     Heart sounds: Normal heart sounds.  Pulmonary:     Effort: Pulmonary effort is normal.     Breath sounds: Normal breath sounds.  Abdominal:     Palpations: Abdomen is soft. There is no hepatomegaly, splenomegaly or mass.     Tenderness: There is no abdominal tenderness.  Musculoskeletal:     Right lower leg: No edema.     Left lower leg: No edema.  Lymphadenopathy:     Cervical: No cervical adenopathy.     Right cervical: No superficial, deep or posterior cervical adenopathy.    Left cervical: No superficial, deep or posterior cervical adenopathy.     Upper Body:     Right upper body: No supraclavicular or axillary adenopathy.     Left upper body: No supraclavicular or axillary adenopathy.  Neurological:     General: No focal deficit  present.     Mental Status: He is alert and oriented to person, place, and time.  Psychiatric:        Mood and Affect: Mood normal.        Behavior: Behavior normal.     LABS:      Latest Ref Rng & Units 03/30/2023   11:47 AM 03/02/2023    8:21 AM 02/23/2023   12:09 PM  CBC  WBC 4.0 - 10.5 K/uL 3.4  1.6  1.5   Hemoglobin 13.0 - 17.0 g/dL 7.7  9.5  8.8   Hematocrit 39.0 - 52.0 % 23.8  29.3  27.2   Platelets 150 - 400 K/uL 191  143  PLATELET CLUMPS NOTED ON SMEAR, COUNT APPEARS ADEQUATE       Latest Ref Rng & Units 03/30/2023   11:47 AM 03/02/2023    8:21 AM 02/23/2023   12:09 PM  CMP  Glucose 70 - 99 mg/dL 100  111  96   BUN 8 - 23 mg/dL 15  15  15    Creatinine 0.61 - 1.24 mg/dL 1.39  1.43  1.28   Sodium 135 - 145 mmol/L 134  136  135   Potassium 3.5 - 5.1 mmol/L 4.1  4.2  4.3   Chloride 98 - 111 mmol/L 106  104  104   CO2 22 - 32 mmol/L 24  26  23    Calcium 8.9 - 10.3 mg/dL 7.8  8.4  8.0   Total Protein 6.5 - 8.1 g/dL 5.0  5.1  5.2   Total Bilirubin 0.3 - 1.2 mg/dL 0.3  0.5  0.7   Alkaline Phos 38 - 126 U/L 30  31  29    AST 15 - 41 U/L 15  19  14    ALT 0 - 44 U/L 21  12  11       No results found for: "CEA1", "CEA" / No results found for: "CEA1", "CEA" No results found for: "PSA1" No results found for: "CAN199" No results found for: "CAN125"  Lab Results  Component Value Date   TOTALPROTELP 4.6 (L) 03/02/2023   ALBUMINELP 3.0 03/02/2023   A1GS 0.3 03/02/2023   A2GS 0.5 03/02/2023   BETS 0.7 03/02/2023   GAMS 0.1 (L) 03/02/2023   MSPIKE Not Observed 03/02/2023   SPEI Comment 03/02/2023   Lab Results  Component Value Date   TIBC 314 03/02/2023   TIBC 308 08/25/2022   TIBC 325 03/25/2020   FERRITIN 161 03/02/2023   FERRITIN 170 08/25/2022   FERRITIN 640 (H) 03/25/2020   IRONPCTSAT 11 (L) 03/02/2023  IRONPCTSAT 24 08/25/2022   IRONPCTSAT 10 (L) 03/25/2020   Lab Results  Component Value Date   LDH 131 11/18/2021   LDH 127 09/30/2021   LDH 102 08/05/2021      STUDIES:   No results found.

## 2023-03-30 ENCOUNTER — Inpatient Hospital Stay: Payer: Medicare Other | Attending: Hematology

## 2023-03-30 ENCOUNTER — Inpatient Hospital Stay (HOSPITAL_BASED_OUTPATIENT_CLINIC_OR_DEPARTMENT_OTHER): Payer: Medicare Other | Admitting: Hematology

## 2023-03-30 ENCOUNTER — Other Ambulatory Visit: Payer: Self-pay

## 2023-03-30 ENCOUNTER — Inpatient Hospital Stay: Payer: Medicare Other

## 2023-03-30 DIAGNOSIS — Z7961 Long term (current) use of immunomodulator: Secondary | ICD-10-CM | POA: Insufficient documentation

## 2023-03-30 DIAGNOSIS — N189 Chronic kidney disease, unspecified: Secondary | ICD-10-CM | POA: Diagnosis not present

## 2023-03-30 DIAGNOSIS — G629 Polyneuropathy, unspecified: Secondary | ICD-10-CM | POA: Diagnosis not present

## 2023-03-30 DIAGNOSIS — Z9484 Stem cells transplant status: Secondary | ICD-10-CM | POA: Diagnosis not present

## 2023-03-30 DIAGNOSIS — I129 Hypertensive chronic kidney disease with stage 1 through stage 4 chronic kidney disease, or unspecified chronic kidney disease: Secondary | ICD-10-CM | POA: Insufficient documentation

## 2023-03-30 DIAGNOSIS — C9000 Multiple myeloma not having achieved remission: Secondary | ICD-10-CM

## 2023-03-30 DIAGNOSIS — D801 Nonfamilial hypogammaglobulinemia: Secondary | ICD-10-CM | POA: Diagnosis not present

## 2023-03-30 DIAGNOSIS — N179 Acute kidney failure, unspecified: Secondary | ICD-10-CM

## 2023-03-30 DIAGNOSIS — Z5112 Encounter for antineoplastic immunotherapy: Secondary | ICD-10-CM | POA: Diagnosis not present

## 2023-03-30 DIAGNOSIS — D709 Neutropenia, unspecified: Secondary | ICD-10-CM | POA: Insufficient documentation

## 2023-03-30 DIAGNOSIS — D649 Anemia, unspecified: Secondary | ICD-10-CM | POA: Diagnosis not present

## 2023-03-30 DIAGNOSIS — Z853 Personal history of malignant neoplasm of breast: Secondary | ICD-10-CM | POA: Insufficient documentation

## 2023-03-30 DIAGNOSIS — C7951 Secondary malignant neoplasm of bone: Secondary | ICD-10-CM

## 2023-03-30 LAB — COMPREHENSIVE METABOLIC PANEL
ALT: 21 U/L (ref 0–44)
AST: 15 U/L (ref 15–41)
Albumin: 2.8 g/dL — ABNORMAL LOW (ref 3.5–5.0)
Alkaline Phosphatase: 30 U/L — ABNORMAL LOW (ref 38–126)
Anion gap: 4 — ABNORMAL LOW (ref 5–15)
BUN: 15 mg/dL (ref 8–23)
CO2: 24 mmol/L (ref 22–32)
Calcium: 7.8 mg/dL — ABNORMAL LOW (ref 8.9–10.3)
Chloride: 106 mmol/L (ref 98–111)
Creatinine, Ser: 1.39 mg/dL — ABNORMAL HIGH (ref 0.61–1.24)
GFR, Estimated: 53 mL/min — ABNORMAL LOW (ref >60)
Glucose, Bld: 100 mg/dL — ABNORMAL HIGH (ref 70–99)
Potassium: 4.1 mmol/L (ref 3.5–5.1)
Sodium: 134 mmol/L — ABNORMAL LOW (ref 135–145)
Total Bilirubin: 0.3 mg/dL (ref 0.3–1.2)
Total Protein: 5 g/dL — ABNORMAL LOW (ref 6.5–8.1)

## 2023-03-30 LAB — CBC WITH DIFFERENTIAL/PLATELET
Abs Immature Granulocytes: 0.01 10*3/uL (ref 0.00–0.07)
Basophils Absolute: 0 10*3/uL (ref 0.0–0.1)
Basophils Relative: 1 %
Eosinophils Absolute: 0.1 10*3/uL (ref 0.0–0.5)
Eosinophils Relative: 3 %
HCT: 23.8 % — ABNORMAL LOW (ref 39.0–52.0)
Hemoglobin: 7.7 g/dL — ABNORMAL LOW (ref 13.0–17.0)
Immature Granulocytes: 0 %
Lymphocytes Relative: 34 %
Lymphs Abs: 1.2 10*3/uL (ref 0.7–4.0)
MCH: 33.8 pg (ref 26.0–34.0)
MCHC: 32.4 g/dL (ref 30.0–36.0)
MCV: 104.4 fL — ABNORMAL HIGH (ref 80.0–100.0)
Monocytes Absolute: 0.7 10*3/uL (ref 0.1–1.0)
Monocytes Relative: 20 %
Neutro Abs: 1.4 10*3/uL — ABNORMAL LOW (ref 1.7–7.7)
Neutrophils Relative %: 42 %
Platelets: 191 10*3/uL (ref 150–400)
RBC: 2.28 MIL/uL — ABNORMAL LOW (ref 4.22–5.81)
RDW: 17.1 % — ABNORMAL HIGH (ref 11.5–15.5)
WBC: 3.4 10*3/uL — ABNORMAL LOW (ref 4.0–10.5)
nRBC: 0 % (ref 0.0–0.2)

## 2023-03-30 MED ORDER — SODIUM CHLORIDE 0.9% FLUSH
10.0000 mL | Freq: Once | INTRAVENOUS | Status: AC
  Administered 2023-03-30: 10 mL via INTRAVENOUS

## 2023-03-30 MED ORDER — EPOETIN ALFA-EPBX 20000 UNIT/ML IJ SOLN
20000.0000 [IU] | Freq: Once | INTRAMUSCULAR | Status: AC
  Administered 2023-03-30: 20000 [IU] via SUBCUTANEOUS
  Filled 2023-03-30: qty 1

## 2023-03-30 MED ORDER — HEPARIN SOD (PORK) LOCK FLUSH 100 UNIT/ML IV SOLN
500.0000 [IU] | Freq: Once | INTRAVENOUS | Status: AC
  Administered 2023-03-30: 500 [IU] via INTRAVENOUS

## 2023-03-30 MED ORDER — DARATUMUMAB-HYALURONIDASE-FIHJ 1800-30000 MG-UT/15ML ~~LOC~~ SOLN
1800.0000 mg | Freq: Once | SUBCUTANEOUS | Status: AC
  Administered 2023-03-30: 1800 mg via SUBCUTANEOUS
  Filled 2023-03-30: qty 15

## 2023-03-30 MED ORDER — SODIUM CHLORIDE 0.9% FLUSH
10.0000 mL | INTRAVENOUS | Status: DC | PRN
  Administered 2023-03-30: 10 mL via INTRAVENOUS

## 2023-03-30 NOTE — Progress Notes (Signed)
Patient is taking Pomalyst and Tamoxifen as prescribed.  He has not missed any doses and reports no side effects at this time.  ° °

## 2023-03-30 NOTE — Patient Instructions (Signed)
[facility] Discharge Instructions   You were seen and examined today by Dr. Katragadda.  He reviewed the results of your lab work which are normal/stable.   We will proceed with your treatment today.  Return as scheduled.    Thank you for choosing [facility] to provide your oncology and hematology care.  To afford each patient quality time with our provider, please arrive at least 15 minutes before your scheduled appointment time.   If you have a lab appointment with the Cancer Center please come in thru the Main Entrance and check in at the main information desk.  You need to re-schedule your appointment should you arrive 10 or more minutes late.  We strive to give you quality time with our providers, and arriving late affects you and other patients whose appointments are after yours.  Also, if you no show three or more times for appointments you may be dismissed from the clinic at the providers discretion.     Again, thank you for choosing [facility].  Our hope is that these requests will decrease the amount of time that you wait before being seen by our physicians.       _____________________________________________________________  Should you have questions after your visit to [facility], please contact our office at [phone] and follow the prompts.  Our office hours are 8:00 a.m. and 4:30 p.m. Monday - Friday.  Please note that voicemails left after 4:00 p.m. may not be returned until the following business day.  We are closed weekends and major holidays.  You do have access to a nurse 24-7, just call the main number to the clinic [phone] and do not press any options, hold on the line and a nurse will answer the phone.    For prescription refill requests, have your pharmacy contact our office and allow 72 hours.    Due to Covid, you will need to wear a mask upon entering  the hospital. If you do not have a mask, a mask will be given to you at the Main Entrance upon arrival. For doctor visits, patients may have 1 support person age 18 or older with them. For treatment visits, patients can not have anyone with them due to social distancing guidelines and our immunocompromised population.      

## 2023-03-30 NOTE — Progress Notes (Signed)
Patient has been examined by Dr. Delton Coombes. Vital signs and labs have been reviewed by MD - ANC, Creatinine, LFTs, hemoglobin (7.7), and platelets are within treatment parameters per M.D. - pt may proceed with treatment.  Give Retacrit 20,000 units per MD. Primary RN and pharmacy notified.

## 2023-03-30 NOTE — Patient Instructions (Signed)
[facility]  Discharge Instructions: Thank you for choosing [facility] to provide your oncology and hematology care.  If you have a lab appointment with the [facility], please come in thru the Main Entrance and check in at the main information desk.  Wear comfortable clothing and clothing appropriate for easy access to any Portacath or PICC line.   We strive to give you quality time with your provider. You may need to reschedule your appointment if you arrive late (15 or more minutes).  Arriving late affects you and other patients whose appointments are after yours.  Also, if you miss three or more appointments without notifying the office, you may be dismissed from the clinic at the provider's discretion.      For prescription refill requests, have your pharmacy contact our office and allow 72 hours for refills to be completed.    Today you received the following chemotherapy and/or immunotherapy agents Dara [place] and Retacrit 20,000 units   To help prevent nausea and vomiting after your treatment, we encourage you to take your nausea medication as directed.  BELOW ARE SYMPTOMS THAT SHOULD BE REPORTED IMMEDIATELY: *FEVER GREATER THAN 100.4 F (38 C) OR HIGHER *CHILLS OR SWEATING *NAUSEA AND VOMITING THAT IS NOT CONTROLLED WITH YOUR NAUSEA MEDICATION *UNUSUAL SHORTNESS OF BREATH *UNUSUAL BRUISING OR BLEEDING *URINARY PROBLEMS (pain or burning when urinating, or frequent urination) *BOWEL PROBLEMS (unusual diarrhea, constipation, pain near the anus) TENDERNESS IN MOUTH AND THROAT WITH OR WITHOUT PRESENCE OF ULCERS (sore throat, sores in mouth, or a toothache) UNUSUAL RASH, SWELLING OR PAIN  UNUSUAL VAGINAL DISCHARGE OR ITCHING   Items with * indicate a potential emergency and should be followed up as soon as possible or go to the Emergency Department if any problems should occur.  Please show the CHEMOTHERAPY ALERT CARD or IMMUNOTHERAPY ALERT CARD at  check-in to the Emergency Department and triage nurse.  Should you have questions after your visit or need to cancel or reschedule your appointment, please contact [facility] [phone]  and follow the prompts.  Office hours are 8:00 a.m. to 4:30 p.m. Monday - Friday. Please note that voicemails left after 4:00 p.m. may not be returned until the following business day.  We are closed weekends and major holidays. You have access to a nurse at all times for urgent questions. Please call the main number to the clinic [phone] and follow the prompts.  For any non-urgent questions, you may also contact your provider using MyChart. We now offer e-Visits for anyone 66 and older to request care online for non-urgent symptoms. For details visit [email or web address].   Also download the MyChart app! Go to the app store, search "MyChart", open the app, select [facility], and log in with your MyChart username and password.

## 2023-03-30 NOTE — Progress Notes (Signed)
Patient presents today for Dara [place] infusion. Patient is in satisfactory condition with no new complaints voiced.  Vital signs are stable.  Labs reviewed by Dr. Delton Coombes during the office visit and all labs are within treatment parameters. Pt will also receive Retacrit 20,000 units per Dr.K. Pt's hemoglobin is 7.7 today. Dr.K made aware.  We will proceed with treatment per MD orders.  Pt took pre-meds at home prior to arrival.   [facility] and Retacrit 20,000 units given today per MD orders. Tolerated infusion without adverse affects. Vital signs stable. No complaints at this time. Discharged from clinic ambulatory in stable condition. Alert and oriented x 3. F/U with [facility] as scheduled.

## 2023-03-31 ENCOUNTER — Inpatient Hospital Stay: Payer: Medicare Other

## 2023-03-31 VITALS — BP 120/73 | HR 63 | Temp 97.6°F | Resp 16

## 2023-03-31 DIAGNOSIS — D649 Anemia, unspecified: Secondary | ICD-10-CM | POA: Diagnosis not present

## 2023-03-31 DIAGNOSIS — G629 Polyneuropathy, unspecified: Secondary | ICD-10-CM | POA: Diagnosis not present

## 2023-03-31 DIAGNOSIS — D709 Neutropenia, unspecified: Secondary | ICD-10-CM | POA: Diagnosis not present

## 2023-03-31 DIAGNOSIS — C9000 Multiple myeloma not having achieved remission: Secondary | ICD-10-CM | POA: Diagnosis not present

## 2023-03-31 DIAGNOSIS — D801 Nonfamilial hypogammaglobulinemia: Secondary | ICD-10-CM | POA: Diagnosis not present

## 2023-03-31 DIAGNOSIS — C7951 Secondary malignant neoplasm of bone: Secondary | ICD-10-CM

## 2023-03-31 DIAGNOSIS — Z5112 Encounter for antineoplastic immunotherapy: Secondary | ICD-10-CM | POA: Diagnosis not present

## 2023-03-31 MED ORDER — DENOSUMAB 120 MG/1.7ML ~~LOC~~ SOLN
120.0000 mg | Freq: Once | SUBCUTANEOUS | Status: AC
  Administered 2023-03-31: 120 mg via SUBCUTANEOUS
  Filled 2023-03-31: qty 1.7

## 2023-03-31 NOTE — Patient Instructions (Signed)
[facility]  Discharge Instructions: Thank you for choosing [facility] to provide your oncology and hematology care.  If you have a lab appointment with the [facility] - please note that after April 8th, 2024, all labs will be drawn in the cancer center.  You do not have to check in or register with the main entrance as you have in the past but will complete your check-in in the cancer center.  Wear comfortable clothing and clothing appropriate for easy access to any Portacath or PICC line.   We strive to give you quality time with your provider. You may need to reschedule your appointment if you arrive late (15 or more minutes).  Arriving late affects you and other patients whose appointments are after yours.  Also, if you miss three or more appointments without notifying the office, you may be dismissed from the clinic at the provider's discretion.      For prescription refill requests, have your pharmacy contact our office and allow 72 hours for refills to be completed.    Today you received the following chemotherapy and/or immunotherapy agents Xgeva      To help prevent nausea and vomiting after your treatment, we encourage you to take your nausea medication as directed.  BELOW ARE SYMPTOMS THAT SHOULD BE REPORTED IMMEDIATELY: *FEVER GREATER THAN 100.4 F (38 C) OR HIGHER *CHILLS OR SWEATING *NAUSEA AND VOMITING THAT IS NOT CONTROLLED WITH YOUR NAUSEA MEDICATION *UNUSUAL SHORTNESS OF BREATH *UNUSUAL BRUISING OR BLEEDING *URINARY PROBLEMS (pain or burning when urinating, or frequent urination) *BOWEL PROBLEMS (unusual diarrhea, constipation, pain near the anus) TENDERNESS IN MOUTH AND THROAT WITH OR WITHOUT PRESENCE OF ULCERS (sore throat, sores in mouth, or a toothache) UNUSUAL RASH, SWELLING OR PAIN  UNUSUAL VAGINAL DISCHARGE OR ITCHING   Items with * indicate a potential emergency and should be followed up as soon as possible or go to the  Emergency Department if any problems should occur.  Please show the CHEMOTHERAPY ALERT CARD or IMMUNOTHERAPY ALERT CARD at check-in to the Emergency Department and triage nurse.  Should you have questions after your visit or need to cancel or reschedule your appointment, please contact [facility] [phone]  and follow the prompts.  Office hours are 8:00 a.m. to 4:30 p.m. Monday - Friday. Please note that voicemails left after 4:00 p.m. may not be returned until the following business day.  We are closed weekends and major holidays. You have access to a nurse at all times for urgent questions. Please call the main number to the clinic [phone] and follow the prompts.  For any non-urgent questions, you may also contact your provider using MyChart. We now offer e-Visits for anyone 67 and older to request care online for non-urgent symptoms. For details visit [email or web address].   Also download the MyChart app! Go to the app store, search "MyChart", open the app, select [facility], and log in with your MyChart username and password.

## 2023-03-31 NOTE — Progress Notes (Signed)
Patient presents today for Xgeva per providers order.  Calcium noted to be 7.8, corrected is 8.76.  Patient has not been taking calcium and Vitamin D supplements, has had no prior or upcoming dental work and no jaw pain.  Vital signs WNL.  Patient has no new complaints at this time.  Stable during administration without incident; injection site WNL; see MAR for injection details.  Patient tolerated procedure well and without incident.  No questions or complaints noted at this time.

## 2023-04-01 LAB — AEROBIC/ANAEROBIC CULTURE W GRAM STAIN (SURGICAL/DEEP WOUND)

## 2023-04-05 DIAGNOSIS — J324 Chronic pansinusitis: Secondary | ICD-10-CM | POA: Diagnosis not present

## 2023-04-05 DIAGNOSIS — Z9889 Other specified postprocedural states: Secondary | ICD-10-CM | POA: Diagnosis not present

## 2023-04-12 ENCOUNTER — Other Ambulatory Visit: Payer: Self-pay

## 2023-04-12 DIAGNOSIS — G4700 Insomnia, unspecified: Secondary | ICD-10-CM

## 2023-04-12 DIAGNOSIS — I10 Essential (primary) hypertension: Secondary | ICD-10-CM

## 2023-04-12 MED ORDER — POMALIDOMIDE 2 MG PO CAPS: ORAL_CAPSULE | ORAL | 0 refills | Status: DC

## 2023-04-12 NOTE — Telephone Encounter (Signed)
Chart reviewed. Pomalyst refilled per last office note with Dr. Katragadda.  

## 2023-04-20 ENCOUNTER — Ambulatory Visit: Payer: Medicare Other

## 2023-04-20 ENCOUNTER — Other Ambulatory Visit: Payer: Medicare Other

## 2023-04-20 ENCOUNTER — Ambulatory Visit: Payer: Medicare Other | Admitting: Hematology

## 2023-04-21 ENCOUNTER — Ambulatory Visit: Payer: Medicare Other

## 2023-04-22 DIAGNOSIS — C9000 Multiple myeloma not having achieved remission: Secondary | ICD-10-CM | POA: Diagnosis not present

## 2023-04-26 DIAGNOSIS — J324 Chronic pansinusitis: Secondary | ICD-10-CM | POA: Diagnosis not present

## 2023-04-26 DIAGNOSIS — Z9889 Other specified postprocedural states: Secondary | ICD-10-CM | POA: Diagnosis not present

## 2023-04-26 NOTE — Progress Notes (Signed)
[facility] [address], [place] [address]    Clinic Day:  04/27/2023  Referring physician: Doreatha Massed, MD  Patient Care Team: Doreatha Massed, MD as PCP - General (Hematology) Doreatha Massed, MD as Consulting Physician (Oncology)   ASSESSMENT & PLAN:   Assessment: 1.  Relapsed multiple myeloma: -Autologous stem cell transplant on 10/08/2017 -Post transplant consolidation with CyCarD for 2 cycles with M spike undetectable. -Maintenance therapy with carfilzomib 70 mg per metered square days 1 and 15 every 28 days from 04/20/2018, dose reduced to 35 mg per metered square on 01/05/2019, titrated up to 56 mg per metered square on 01/09/2020. 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 -PET scan on 02/18/2020 showed new left supraclavicular lymph node at the thoracic inlet, SUV 14.2.  Multifocal osseous lesions largely improved, though residual lesions noted including right acromion, left acromion, thyroid cartilage, right sternum.  Additional lesions throughout the sternum, bilateral ribs, thoracolumbar spine, bilateral pelvis have resolved. -BMBX on 02/14/2020 shows plasma cell myeloma, 60-70% of cells.  FISH for myeloma was negative.  Cytogenetics was normal. -4 cycles of daratumumab, bortezomib and dexamethasone from 02/28/2020 through 04/29/2020. -Daratumumab, pomalidomide and dexamethasone started on 05/28/2020.  He is receiving daratumumab every 2 weeks, Pomalyst 3 mg days 1-21, dexamethasone 20 mg weekly. -Pomalidomide dose reduced to 2 mg days 1-21 around the first week of July 2021, due to severe weakness.    Plan: 1.  Relapsed IgG kappa multiple myeloma: - He is tolerating pomalidomide 2 mg tablet along with Darzalex reasonably well. - Myeloma panel from 03/02/2023 with M spike not detectable.  Free light chain ratio is 1.12. - He was evaluated at [facility] last week. - Labs today: Normal  LFTs.  Creatinine 1.29.  CBC was grossly normal with mild leukopenia and ANC of 1.6. - He will start pomalidomide 2 mg 3 weeks on/1 week off and continue Darzalex monthly.  He is taking dexamethasone 20 mg on days of Darzalex. - RTC 8 weeks for follow-up.   2.  Myeloma bone disease: - Continue monthly denosumab.  Continue calcium supplements.   3.  Macrocytic anemia: - Anemia from chronic inflammation, CKD and relative iron deficiency. - Last ferritin was 161 and percent saturation 11. - Continue Retacrit.  Recommend Monoferric 1 g x 1.   4.  ID prophylaxis: - Continue acyclovir twice daily.   5.  Peripheral neuropathy: - Continue gabapentin 600 mg 3 times daily.   6.  History of breast cancer: - Continue tamoxifen daily.  7.  Hypogammaglobulinemia: - IgG levels are severely low.  However he denies any infections in the last 6 months.  He recently took Augmentin after sinus surgery.  If there is any infections, will start him on IVIG.  Orders Placed This Encounter  Procedures   CBC with Differential    Standing Status:   Future    Standing Expiration Date:   06/22/2024   CBC with Differential    Standing Status:   Future    Standing Expiration Date:   07/20/2024   CBC with Differential    Standing Status:   Future    Standing Expiration Date:   08/17/2024   CBC with Differential    Standing Status:   Future    Standing Expiration Date:   09/14/2024   Immunofixation electrophoresis    Standing Status:   Future    Standing Expiration Date:   04/26/2024   Kappa/lambda light  chains    Standing Status:   Future    Standing Expiration Date:   04/26/2024   Protein electrophoresis, serum    Standing Status:   Future    Standing Expiration Date:   04/26/2024      I,Katie Daubenspeck,acting as a scribe for Doreatha Massed, MD.,have documented all relevant documentation on the behalf of Doreatha Massed, MD,as directed by  Doreatha Massed, MD while in the presence of  Doreatha Massed, MD.   I, Doreatha Massed MD, have reviewed the above documentation for accuracy and completeness, and I agree with the above.   Doreatha Massed, MD   4/30/20245:51 PM  CHIEF COMPLAINT:   Diagnosis: multiple myeloma    Cancer Staging  Multiple myeloma ([name] Staging form: Plasma Cell Myeloma and Plasma Cell Disorders, AJCC 8th Edition - Clinical stage from 05/03/2017: Beta-2-microglobulin (mg/L): 3.5, Albumin (g/dL): 3.4, ISS: Stage II, High-risk cytogenetics: Absent, LDH: Not assessed - Signed by Ellouise Newer, PA-C on 05/03/2017    Prior Therapy: 1. RVD x 6 cycles from 03/01/2017 to 06/29/2017. 2. Stem cell transplant on 09/30/2017. 3. Carfilzomib and cyclophosphamide x 2 cycles from 02/14/2018 to 03/29/2018. 4. Carfilzomib x 22 cycles from 04/20/2018 to 01/23/2020.  Current Therapy:  Darzalex Faspro monthly; Pomalyst 2 mg 3/4 weeks; Xgeva monthly    HISTORY OF PRESENT ILLNESS:   Oncology History  Breast cancer, male ([name]  12/31/2009 Initial Biopsy   Biopsy of L breast    12/31/2009 Pathology Results   Invasive ductal carcinoma, ER/PR+, HER 2 negative   12/31/2009 Imaging   Ultrasound showing a 2.43 x 1.85 x 3 cm hypoechoic spiculated mass in the 12 o clock L breast retroareolar region   01/01/2010 -  Anti-estrogen oral therapy   Tamoxifen 20 mg daily   01/06/2010 Imaging   Bone scan abnormal uptake in the diaphysis of the R humerus, abnormal in the R third, fifth and sixth ribs, lesion also noted in the sternum.   02/03/2010 Surgery   Rod placement and fixation of R humerus by Dr. Amanda Pea   02/05/2010 - 02/18/2010 Radiation Therapy   30Gy in 10 fractions of 3 Gy per fraction to R pathologic fracture   03/11/2010 -  Chemotherapy   Denosumab monthly, now every 3 months. Started at [facility]    06/09/2016 Imaging   Three hypermetabolic osseous lesions in the sternum, left ilium and right ilium, as discussed above, likely represent osseous metastases.  At this time, these are not recognizable on the CT images. 2. No extra skeletal metastatic disease identified in the neck, chest, abdomen or pelvis.   10/13/2016 Progression   PET shows various new and enlarging osseous metastatic lesions with no definite extra osseous metastatic disease currently identified.    12/31/2016 Progression   1. Multifocal hypermetabolic osseous metastases throughout the axial and proximal appendicular skeleton, which are increased in size, number and metabolism since 10/13/2016 PET-CT. 2. 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 3. No additional sites of hypermetabolic metastatic disease. 4. Chronic right mastoid sinusitis. 5. Aortic atherosclerosis.  One vessel coronary atherosclerosis.   Multiple myeloma ([name]  02/12/2017 Bone Marrow Biopsy   The marrow was variably cellular with large peritrabecular aggregates of kappa restricted plasma cells (66% by aspirate, 30% by Cd138). Cytogenetics +11.    03/01/2017 - 06/29/2017 Chemotherapy   RVD    05/26/2017 Bone Marrow Biopsy   Performed at [facility]:  Plasma cell myeloma in a 30% cellular marrow with decreased trilineage hematopoiesis and 42% kappa light chain restricted plasma cells on the aspirate smears and large aggregates on the core biopsy.     07/12/2017 - 09/01/2017 Chemotherapy   3 cycles of carfizolmib/cyclophosphamide/dexamethasone     10/08/2017 Bone Marrow Transplant   [facility]otransplant at [facility]   02/28/2020 - 09/01/2022 Chemotherapy   Patient is on Treatment Plan : Daratumumab q28d     09/01/2022 -  Chemotherapy   Patient is on Treatment Plan : MYELOMA Daratumumab Faspro Subq q28d     Multiple myeloma not having achieved remission ([name]  06/29/2017 Initial Diagnosis   Multiple myeloma not having achieved remission ([name]   02/28/2020 - 09/01/2022  Chemotherapy   Patient is on Treatment Plan : Daratumumab q28d     09/01/2022 -  Chemotherapy   Patient is on Treatment Plan : MYELOMA Daratumumab Faspro Subq q28d        INTERVAL HISTORY:   Johnny Lucas is a 77 y.o. male presenting to clinic today for follow up of multiple myeloma. He was last seen by me on 03/30/23.  Today, he states that he is doing well overall. His appetite level is at 80%. His energy level is at 80%.  PAST MEDICAL HISTORY:   Past Medical History: Past Medical History:  Diagnosis Date   Anxiety    Bone metastases 09/10/2016   Breast cancer, male [facility]) 2011   Stage IV breast cancer; radiation and tamoxifen  Overview:  Left breast ca with mets bone  Overview:  METS TO BONE   Bronchitis    uses inhaler   Cough    uses inhaler   GERD (gastroesophageal reflux disease)    Headache    History of blood transfusion    Hypertension    Macular degeneration    bilateral   Memory loss    mild   Multiple myeloma ([name] 02/18/2017   Peripheral neuropathy    feet   Pneumonia    mild case 1-2 yrs ago (2023 or 2024)    Surgical History: Past Surgical History:  Procedure Laterality Date   BACK SURGERY     C2-C3 fusion   BREAST BIOPSY Left    cancer   CATARACT EXTRACTION W/PHACO Left 02/03/2019   Procedure: CATARACT EXTRACTION PHACO AND INTRAOCULAR LENS PLACEMENT (IOC);  Surgeon: Fabio Pierce, MD;  Location: [facility];  Service: Ophthalmology;  Laterality: Left;  CDE: 15.89   CATARACT EXTRACTION W/PHACO Right 07/25/2021   Procedure: CATARACT EXTRACTION PHACO AND INTRAOCULAR LENS PLACEMENT (IOC);  Surgeon: Fabio Pierce, MD;  Location: [facility];  Service: Ophthalmology;  Laterality: Right;  CDE   26.71   COLONOSCOPY     HERNIA REPAIR     PORTACATH PLACEMENT Left 06/10/2018   Procedure: INSERTION PORT-A-CATH;  Surgeon: Lucretia Roers, MD;  Location: [facility];  Service: General;  Laterality: Left;   right arm surgery     bone cancer and rod placed   SINUS ENDO WITH  FUSION Bilateral 03/25/2023   Procedure: FUNCTIONAL ENDOSCOPIC SINUS SURGERY WITH FUSION AND NAVIGATION;  Surgeon: [name], MD;  Location: [facility];  Service: ENT;  Laterality: Bilateral;    Social History: Social History   Socioeconomic History   Marital status: Married    Spouse name: Not on file   Number of children: 3   Years of education: Not on file   Highest education level: Not on file  Occupational History   Not on  file  Tobacco Use   Smoking status: Never   Smokeless tobacco: Former    Types: Associate Professor Use: Never used  Substance and Sexual Activity   Alcohol use: Yes    Alcohol/week: 24.0 standard drinks of alcohol    Types: 24 Cans of beer per week    Comment: No ETOH in the last 6 months as of 02/2023   Drug use: No   Sexual activity: Yes  Other Topics Concern   Not on file  Social History Narrative   Not on file   Social Determinants of Health   Financial Resource Strain: Low Risk  (11/26/2020)   Overall Financial Resource Strain (CARDIA)    Difficulty of Paying Living Expenses: Not hard at all  Food Insecurity: No Food Insecurity (11/26/2020)   Hunger Vital Sign    Worried About Running Out of Food in the Last Year: Never true    Ran Out of Food in the Last Year: Never true  Transportation Needs: No Transportation Needs (11/26/2020)   PRAPARE - Administrator, Civil Service (Medical): No    Lack of Transportation (Non-Medical): No  Physical Activity: Inactive (11/26/2020)   Exercise Vital Sign    Days of Exercise per Week: 0 days    Minutes of Exercise per Session: 0 min  Stress: Stress Concern Present (11/26/2020)   [employer] of Occupational Health - Occupational Stress Questionnaire    Feeling of Stress : To some extent  Social Connections: Moderately Integrated (11/26/2020)   Social Connection and Isolation Panel [NHANES]    Frequency of Communication with Friends and Family: More than three times a week     Frequency of Social Gatherings with Friends and Family: Three times a week    Attends Religious Services: 1 to 4 times per year    Active Member of Clubs or Organizations: No    Attends Banker Meetings: Never    Marital Status: Married  Catering manager Violence: Not At Risk (11/26/2020)   Humiliation, Afraid, Rape, and Kick questionnaire    Fear of Current or Ex-Partner: No    Emotionally Abused: No    Physically Abused: No    Sexually Abused: No    Family History: Family History  Problem Relation Age of Onset   Stroke Mother    Cancer Maternal Aunt        cancer NOS; died in her 24s   Lung cancer Maternal Uncle        smoker    Current Medications:  Current Outpatient Medications:    acetaminophen (TYLENOL) 500 MG tablet, Take 1,000 mg by mouth daily., Disp: , Rfl:    acyclovir (ZOVIRAX) 800 MG tablet, Take 1 tablet (800 mg total) by mouth 2 (two) times daily., Disp: 180 tablet, Rfl: 3   albuterol (VENTOLIN HFA) 108 (90 Base) MCG/ACT inhaler, Inhale 2 puffs into the lungs every 6 (six) hours as needed for wheezing or shortness of breath., Disp: , Rfl:    ALPRAZolam (XANAX) 0.5 MG tablet, TAKE 1 TABLET BY MOUTH AT BEDTIME AS NEEDED FOR anxiety / SLEEP, Disp: 30 tablet, Rfl: 5   aspirin EC 81 MG tablet, Take 81 mg by mouth daily. , Disp: , Rfl:    budesonide (PULMICORT) 0.5 MG/2ML nebulizer solution, Add 2 mL (1 vial) of budesonide into 240 mL saline irrigation bottle and irrigate both nostrils morning and night (one bottle total per day)., Disp: , Rfl:    denosumab (  XGEVA) 120 MG/1.7ML SOLN injection, Inject 120 mg into the skin every 30 (thirty) days. , Disp: , Rfl:    folic acid (FOLVITE) 1 MG tablet, TAKE 1 TABLET BY MOUTH DAILY, Disp: 90 tablet, Rfl: 2   gabapentin (NEURONTIN) 300 MG capsule, TAKE ONE CAPSULE BY MOUTH THREE TIMES DAILY, Disp: 180 capsule, Rfl: 2   Loperamide HCl (IMODIUM PO), Take 2 mg by mouth daily as needed (diarrhea)., Disp: , Rfl:     loratadine (CLARITIN) 10 MG tablet, Take 1 tablet (10 mg total) by mouth daily., Disp: 90 tablet, Rfl: 6   metoprolol succinate (TOPROL-XL) 100 MG 24 hr tablet, Take 100 mg by mouth in the morning., Disp: , Rfl:    ondansetron (ZOFRAN) 8 MG tablet, TAKE 1 TABLET BY MOUTH EVERY 8 HOURS AS NEEDED FOR NAUSEA AND VOMITING, Disp: 30 tablet, Rfl: 2   pantoprazole (PROTONIX) 40 MG tablet, TAKE 1 TABLET EVERY OTHER DAY, Disp: 45 tablet, Rfl: 6   polyethylene glycol (MIRALAX / GLYCOLAX) packet, Take 17 g by mouth daily as needed for mild constipation., Disp: 14 each, Rfl: 0   pomalidomide (POMALYST) 2 MG capsule, TAKE 1 CAPSULE BY MOUTH EVERY DAY FOR 21 DAYS ON FOLLOWED BY 7 DAYS OFF, Disp: 21 capsule, Rfl: 0   sildenafil (VIAGRA) 100 MG tablet, 100 mg as needed for erectile dysfunction., Disp: , Rfl:    tamoxifen (NOLVADEX) 20 MG tablet, TAKE 1 TABLET EVERY DAY, Disp: 90 tablet, Rfl: 3 No current facility-administered medications for this visit.  Facility-Administered Medications Ordered in Other Visits:    ondansetron (ZOFRAN) 8 mg in sodium chloride 0.9 % 50 mL IVPB, 8 mg, Intravenous, Once, Lockamy, Randi L, NP-C   sodium chloride flush (NS) 0.9 % injection 10 mL, 10 mL, Intracatheter, Once PRN, Doreatha Massed, MD   Allergies: Allergies  Allergen Reactions   Cefepime     Suspected severe thrombocytopenia is a result of cefepime induced antigen platelet destruction    REVIEW OF SYSTEMS:   Review of Systems  Constitutional:  Negative for chills, fatigue and fever.  HENT:   Negative for lump/mass, mouth sores, nosebleeds, sore throat and trouble swallowing.   Eyes:  Negative for eye problems.  Respiratory:  Negative for cough and shortness of breath.   Cardiovascular:  Negative for chest pain, leg swelling and palpitations.  Gastrointestinal:  Negative for abdominal pain, constipation, diarrhea, nausea and vomiting.  Genitourinary:  Negative for bladder incontinence, difficulty urinating,  dysuria, frequency, hematuria and nocturia.   Musculoskeletal:  Negative for arthralgias, back pain, flank pain, myalgias and neck pain.  Skin:  Negative for itching and rash.  Neurological:  Positive for dizziness and numbness. Negative for headaches.  Hematological:  Does not bruise/bleed easily.  Psychiatric/Behavioral:  Negative for depression, sleep disturbance and suicidal ideas. The patient is not nervous/anxious.   All other systems reviewed and are negative.    VITALS:   There were no vitals taken for this visit.  Wt Readings from Last 3 Encounters:  04/27/23 195 lb 6.4 oz (88.6 kg)  03/30/23 196 lb 6.4 oz (89.1 kg)  03/25/23 190 lb (86.2 kg)    There is no height or weight on file to calculate BMI.  Performance status (ECOG): 1 - Symptomatic but completely ambulatory  PHYSICAL EXAM:   Physical Exam Vitals and nursing note reviewed. Exam conducted with a chaperone present.  Constitutional:      Appearance: Normal appearance.  Cardiovascular:     Rate and Rhythm: Normal rate and  regular rhythm.     Pulses: Normal pulses.     Heart sounds: Normal heart sounds.  Pulmonary:     Effort: Pulmonary effort is normal.     Breath sounds: Normal breath sounds.  Abdominal:     Palpations: Abdomen is soft. There is no hepatomegaly, splenomegaly or mass.     Tenderness: There is no abdominal tenderness.  Musculoskeletal:     Right lower leg: No edema.     Left lower leg: No edema.  Lymphadenopathy:     Cervical: No cervical adenopathy.     Right cervical: No superficial, deep or posterior cervical adenopathy.    Left cervical: No superficial, deep or posterior cervical adenopathy.     Upper Body:     Right upper body: No supraclavicular or axillary adenopathy.     Left upper body: No supraclavicular or axillary adenopathy.  Neurological:     General: No focal deficit present.     Mental Status: He is alert and oriented to person, place, and time.  Psychiatric:         Mood and Affect: Mood normal.        Behavior: Behavior normal.     LABS:      Latest Ref Rng & Units 04/27/2023    9:03 AM 03/30/2023   11:47 AM 03/02/2023    8:21 AM  CBC  WBC 4.0 - 10.5 K/uL 3.1  3.4  1.6   Hemoglobin 13.0 - 17.0 g/dL 8.9  7.7  9.5   Hematocrit 39.0 - 52.0 % 27.7  23.8  29.3   Platelets 150 - 400 K/uL 155  191  143       Latest Ref Rng & Units 04/27/2023    9:03 AM 03/30/2023   11:47 AM 03/02/2023    8:21 AM  CMP  Glucose 70 - 99 mg/dL 96  191  478   BUN 8 - 23 mg/dL 16  15  15    Creatinine 0.61 - 1.24 mg/dL 2.95  6.21  3.08   Sodium 135 - 145 mmol/L 136  134  136   Potassium 3.5 - 5.1 mmol/L 4.3  4.1  4.2   Chloride 98 - 111 mmol/L 107  106  104   CO2 22 - 32 mmol/L 24  24  26    Calcium 8.9 - 10.3 mg/dL 8.1  7.8  8.4   Total Protein 6.5 - 8.1 g/dL 5.2  5.0  5.1   Total Bilirubin 0.3 - 1.2 mg/dL 0.8  0.3  0.5   Alkaline Phos 38 - 126 U/L 26  30  31    AST 15 - 41 U/L 15  15  19    ALT 0 - 44 U/L 12  21  12       No results found for: "CEA1", "CEA" / No results found for: "CEA1", "CEA" No results found for: "PSA1" No results found for: "CAN199" No results found for: "CAN125"  Lab Results  Component Value Date   TOTALPROTELP 4.6 (L) 03/02/2023   ALBUMINELP 3.0 03/02/2023   A1GS 0.3 03/02/2023   A2GS 0.5 03/02/2023   BETS 0.7 03/02/2023   GAMS 0.1 (L) 03/02/2023   MSPIKE Not Observed 03/02/2023   SPEI Comment 03/02/2023   Lab Results  Component Value Date   TIBC 314 03/02/2023   TIBC 308 08/25/2022   TIBC 325 03/25/2020   FERRITIN 161 03/02/2023   FERRITIN 170 08/25/2022   FERRITIN 640 (H) 03/25/2020   IRONPCTSAT 11 (L) 03/02/2023  IRONPCTSAT 24 08/25/2022   IRONPCTSAT 10 (L) 03/25/2020   Lab Results  Component Value Date   LDH 131 11/18/2021   LDH 127 09/30/2021   LDH 102 08/05/2021     STUDIES:   No results found.

## 2023-04-27 ENCOUNTER — Inpatient Hospital Stay: Payer: Medicare Other

## 2023-04-27 ENCOUNTER — Encounter (HOSPITAL_COMMUNITY): Payer: Self-pay | Admitting: Hematology

## 2023-04-27 ENCOUNTER — Inpatient Hospital Stay (HOSPITAL_BASED_OUTPATIENT_CLINIC_OR_DEPARTMENT_OTHER): Payer: Medicare Other | Admitting: Hematology

## 2023-04-27 VITALS — BP 131/63 | HR 59 | Temp 98.1°F | Resp 20 | Wt 195.4 lb

## 2023-04-27 DIAGNOSIS — D649 Anemia, unspecified: Secondary | ICD-10-CM | POA: Diagnosis not present

## 2023-04-27 DIAGNOSIS — N179 Acute kidney failure, unspecified: Secondary | ICD-10-CM

## 2023-04-27 DIAGNOSIS — D509 Iron deficiency anemia, unspecified: Secondary | ICD-10-CM | POA: Insufficient documentation

## 2023-04-27 DIAGNOSIS — C9000 Multiple myeloma not having achieved remission: Secondary | ICD-10-CM

## 2023-04-27 DIAGNOSIS — D508 Other iron deficiency anemias: Secondary | ICD-10-CM

## 2023-04-27 DIAGNOSIS — D801 Nonfamilial hypogammaglobulinemia: Secondary | ICD-10-CM | POA: Diagnosis not present

## 2023-04-27 DIAGNOSIS — Z95828 Presence of other vascular implants and grafts: Secondary | ICD-10-CM

## 2023-04-27 DIAGNOSIS — Z5112 Encounter for antineoplastic immunotherapy: Secondary | ICD-10-CM | POA: Diagnosis not present

## 2023-04-27 DIAGNOSIS — C7951 Secondary malignant neoplasm of bone: Secondary | ICD-10-CM

## 2023-04-27 DIAGNOSIS — G629 Polyneuropathy, unspecified: Secondary | ICD-10-CM | POA: Diagnosis not present

## 2023-04-27 DIAGNOSIS — D709 Neutropenia, unspecified: Secondary | ICD-10-CM | POA: Diagnosis not present

## 2023-04-27 LAB — CBC WITH DIFFERENTIAL/PLATELET
Abs Immature Granulocytes: 0.01 10*3/uL (ref 0.00–0.07)
Basophils Absolute: 0.1 10*3/uL (ref 0.0–0.1)
Basophils Relative: 2 %
Eosinophils Absolute: 0.2 10*3/uL (ref 0.0–0.5)
Eosinophils Relative: 5 %
HCT: 27.7 % — ABNORMAL LOW (ref 39.0–52.0)
Hemoglobin: 8.9 g/dL — ABNORMAL LOW (ref 13.0–17.0)
Immature Granulocytes: 0 %
Lymphocytes Relative: 20 %
Lymphs Abs: 0.6 10*3/uL — ABNORMAL LOW (ref 0.7–4.0)
MCH: 33.7 pg (ref 26.0–34.0)
MCHC: 32.1 g/dL (ref 30.0–36.0)
MCV: 104.9 fL — ABNORMAL HIGH (ref 80.0–100.0)
Monocytes Absolute: 0.6 10*3/uL (ref 0.1–1.0)
Monocytes Relative: 20 %
Neutro Abs: 1.6 10*3/uL — ABNORMAL LOW (ref 1.7–7.7)
Neutrophils Relative %: 53 %
Platelets: 155 10*3/uL (ref 150–400)
RBC: 2.64 MIL/uL — ABNORMAL LOW (ref 4.22–5.81)
RDW: 16.6 % — ABNORMAL HIGH (ref 11.5–15.5)
WBC: 3.1 10*3/uL — ABNORMAL LOW (ref 4.0–10.5)
nRBC: 0 % (ref 0.0–0.2)

## 2023-04-27 LAB — COMPREHENSIVE METABOLIC PANEL
ALT: 12 U/L (ref 0–44)
AST: 15 U/L (ref 15–41)
Albumin: 3.2 g/dL — ABNORMAL LOW (ref 3.5–5.0)
Alkaline Phosphatase: 26 U/L — ABNORMAL LOW (ref 38–126)
Anion gap: 5 (ref 5–15)
BUN: 16 mg/dL (ref 8–23)
CO2: 24 mmol/L (ref 22–32)
Calcium: 8.1 mg/dL — ABNORMAL LOW (ref 8.9–10.3)
Chloride: 107 mmol/L (ref 98–111)
Creatinine, Ser: 1.29 mg/dL — ABNORMAL HIGH (ref 0.61–1.24)
GFR, Estimated: 58 mL/min — ABNORMAL LOW (ref >60)
Glucose, Bld: 96 mg/dL (ref 70–99)
Potassium: 4.3 mmol/L (ref 3.5–5.1)
Sodium: 136 mmol/L (ref 135–145)
Total Bilirubin: 0.8 mg/dL (ref 0.3–1.2)
Total Protein: 5.2 g/dL — ABNORMAL LOW (ref 6.5–8.1)

## 2023-04-27 MED ORDER — HEPARIN SOD (PORK) LOCK FLUSH 100 UNIT/ML IV SOLN
500.0000 [IU] | Freq: Once | INTRAVENOUS | Status: AC
  Administered 2023-04-27: 500 [IU] via INTRAVENOUS

## 2023-04-27 MED ORDER — EPOETIN ALFA-EPBX 20000 UNIT/ML IJ SOLN
20000.0000 [IU] | Freq: Once | INTRAMUSCULAR | Status: AC
  Administered 2023-04-27: 20000 [IU] via SUBCUTANEOUS
  Filled 2023-04-27: qty 1

## 2023-04-27 MED ORDER — DARATUMUMAB-HYALURONIDASE-FIHJ 1800-30000 MG-UT/15ML ~~LOC~~ SOLN
1800.0000 mg | Freq: Once | SUBCUTANEOUS | Status: AC
  Administered 2023-04-27: 1800 mg via SUBCUTANEOUS
  Filled 2023-04-27: qty 15

## 2023-04-27 MED ORDER — SODIUM CHLORIDE 0.9% FLUSH
10.0000 mL | Freq: Once | INTRAVENOUS | Status: AC
  Administered 2023-04-27: 10 mL via INTRAVENOUS

## 2023-04-27 NOTE — Patient Instructions (Signed)
[facility]  Discharge Instructions: Thank you for choosing [facility] to provide your oncology and hematology care.  If you have a lab appointment with the Cancer Center - please note that after April 8th, 2024, all labs will be drawn in the cancer center.  You do not have to check in or register with the main entrance as you have in the past but will complete your check-in in the cancer center.  Wear comfortable clothing and clothing appropriate for easy access to any Portacath or PICC line.   We strive to give you quality time with your provider. You may need to reschedule your appointment if you arrive late (15 or more minutes).  Arriving late affects you and other patients whose appointments are after yours.  Also, if you miss three or more appointments without notifying the office, you may be dismissed from the clinic at the provider's discretion.      For prescription refill requests, have your pharmacy contact our office and allow 72 hours for refills to be completed.    Today you received the following chemotherapy and/or immunotherapy agents Dara Tool and Retacrit 20,000 units   To help prevent nausea and vomiting after your treatment, we encourage you to take your nausea medication as directed.  BELOW ARE SYMPTOMS THAT SHOULD BE REPORTED IMMEDIATELY: *FEVER GREATER THAN 100.4 F (38 C) OR HIGHER *CHILLS OR SWEATING *NAUSEA AND VOMITING THAT IS NOT CONTROLLED WITH YOUR NAUSEA MEDICATION *UNUSUAL SHORTNESS OF BREATH *UNUSUAL BRUISING OR BLEEDING *URINARY PROBLEMS (pain or burning when urinating, or frequent urination) *BOWEL PROBLEMS (unusual diarrhea, constipation, pain near the anus) TENDERNESS IN MOUTH AND THROAT WITH OR WITHOUT PRESENCE OF ULCERS (sore throat, sores in mouth, or a toothache) UNUSUAL RASH, SWELLING OR PAIN  UNUSUAL VAGINAL DISCHARGE OR ITCHING   Items with * indicate a potential emergency and should be followed up as soon as  possible or go to the Emergency Department if any problems should occur.  Please show the CHEMOTHERAPY ALERT CARD or IMMUNOTHERAPY ALERT CARD at check-in to the Emergency Department and triage nurse.  Should you have questions after your visit or need to cancel or reschedule your appointment, please contact [facility] [phone]  and follow the prompts.  Office hours are 8:00 a.m. to 4:30 p.m. Monday - Friday. Please note that voicemails left after 4:00 p.m. may not be returned until the following business day.  We are closed weekends and major holidays. You have access to a nurse at all times for urgent questions. Please call the main number to the clinic [phone] and follow the prompts.  For any non-urgent questions, you may also contact your provider using MyChart. We now offer e-Visits for anyone 75 and older to request care online for non-urgent symptoms. For details visit [email or web address].   Also download the MyChart app! Go to the app store, search "MyChart", open the app, select [facility], and log in with your MyChart username and password.

## 2023-04-27 NOTE — Patient Instructions (Addendum)
[facility] at [facility] Discharge Instructions   You were seen and examined today by Dr. Ellin Saba.  He reviewed the results of your lab work which are mostly normal/stable. Your immunoglobulins are very low. If you continue to get repeated infections, we will need to give you IVIG infusions to help get your immunoglobulins back to a normal level. Your myeloma labs are stable.   We will proceed with your treatment today.   Return as scheduled.    Thank you for choosing [facility] at [facility] to provide your oncology and hematology care.  To afford each patient quality time with our provider, please arrive at least 15 minutes before your scheduled appointment time.   If you have a lab appointment with the Cancer Center please come in thru the Main Entrance and check in at the main information desk.  You need to re-schedule your appointment should you arrive 10 or more minutes late.  We strive to give you quality time with our providers, and arriving late affects you and other patients whose appointments are after yours.  Also, if you no show three or more times for appointments you may be dismissed from the clinic at the providers discretion.     Again, thank you for choosing [facility].  Our hope is that these requests will decrease the amount of time that you wait before being seen by our physicians.       _____________________________________________________________  Should you have questions after your visit to [facility], please contact our office at [phone] and follow the prompts.  Our office hours are 8:00 a.m. and 4:30 p.m. Monday - Friday.  Please note that voicemails left after 4:00 p.m. may not be returned until the following business day.  We are closed weekends and major holidays.  You do have access to a nurse 24-7, just call the main number to the clinic [phone] and do not press any options,  hold on the line and a nurse will answer the phone.    For prescription refill requests, have your pharmacy contact our office and allow 72 hours.    Due to Covid, you will need to wear a mask upon entering the hospital. If you do not have a mask, a mask will be given to you at the Main Entrance upon arrival. For doctor visits, patients may have 1 support person age 72 or older with them. For treatment visits, patients can not have anyone with them due to social distancing guidelines and our immunocompromised population.

## 2023-04-27 NOTE — Progress Notes (Signed)
Patient presents today for Dara [place] and Retacrit  20,000 units. Patient is in satisfactory condition with no new complaints voiced.  Vital signs are stable.  Labs reviewed by Dr. Ellin Saba during the office visit and all labs are within treatment parameters.  We will proceed with treatment per MD orders.   Dara [place] and Retacrit 20,000 units given today per MD orders. Tolerated infusion without adverse affects. Vital signs stable. No complaints at this time. Discharged from clinic ambulatory in stable condition. Alert and oriented x 3. F/U with [facility] as scheduled.

## 2023-04-27 NOTE — Progress Notes (Signed)
Patient has been examined by Dr. Katragadda. Vital signs and labs have been reviewed by MD - ANC, Creatinine, LFTs, hemoglobin, and platelets are within treatment parameters per M.D. - pt may proceed with treatment.  Primary RN and pharmacy notified.  

## 2023-04-28 ENCOUNTER — Inpatient Hospital Stay: Payer: Medicare Other | Attending: Hematology

## 2023-04-28 ENCOUNTER — Other Ambulatory Visit: Payer: Self-pay

## 2023-04-28 VITALS — BP 128/75 | HR 59 | Resp 16

## 2023-04-28 DIAGNOSIS — Z79899 Other long term (current) drug therapy: Secondary | ICD-10-CM | POA: Insufficient documentation

## 2023-04-28 DIAGNOSIS — Z5112 Encounter for antineoplastic immunotherapy: Secondary | ICD-10-CM | POA: Insufficient documentation

## 2023-04-28 DIAGNOSIS — C9000 Multiple myeloma not having achieved remission: Secondary | ICD-10-CM | POA: Insufficient documentation

## 2023-04-28 DIAGNOSIS — C7951 Secondary malignant neoplasm of bone: Secondary | ICD-10-CM

## 2023-04-28 MED ORDER — DENOSUMAB 120 MG/1.7ML ~~LOC~~ SOLN
120.0000 mg | Freq: Once | SUBCUTANEOUS | Status: AC
  Administered 2023-04-28: 120 mg via SUBCUTANEOUS
  Filled 2023-04-28: qty 1.7

## 2023-04-28 NOTE — Patient Instructions (Signed)
[facility] [place]  Discharge Instructions: Thank you for choosing [facility] to provide your oncology and hematology care.  If you have a lab appointment with the Cancer Center - please note that after April 8th, 2024, all labs will be drawn in the cancer center.  You do not have to check in or register with the main entrance as you have in the past but will complete your check-in in the cancer center.  Wear comfortable clothing and clothing appropriate for easy access to any Portacath or PICC line.   We strive to give you quality time with your provider. You may need to reschedule your appointment if you arrive late (15 or more minutes).  Arriving late affects you and other patients whose appointments are after yours.  Also, if you miss three or more appointments without notifying the office, you may be dismissed from the clinic at the provider's discretion.      For prescription refill requests, have your pharmacy contact our office and allow 72 hours for refills to be completed.    Today you received the following Xgeva, return as scheduled.   To help prevent nausea and vomiting after your treatment, we encourage you to take your nausea medication as directed.  BELOW ARE SYMPTOMS THAT SHOULD BE REPORTED IMMEDIATELY: *FEVER GREATER THAN 100.4 F (38 C) OR HIGHER *CHILLS OR SWEATING *NAUSEA AND VOMITING THAT IS NOT CONTROLLED WITH YOUR NAUSEA MEDICATION *UNUSUAL SHORTNESS OF BREATH *UNUSUAL BRUISING OR BLEEDING *URINARY PROBLEMS (pain or burning when urinating, or frequent urination) *BOWEL PROBLEMS (unusual diarrhea, constipation, pain near the anus) TENDERNESS IN MOUTH AND THROAT WITH OR WITHOUT PRESENCE OF ULCERS (sore throat, sores in mouth, or a toothache) UNUSUAL RASH, SWELLING OR PAIN  UNUSUAL VAGINAL DISCHARGE OR ITCHING   Items with * indicate a potential emergency and should be followed up as soon as possible or go to the Emergency Department if any  problems should occur.  Please show the CHEMOTHERAPY ALERT CARD or IMMUNOTHERAPY ALERT CARD at check-in to the Emergency Department and triage nurse.  Should you have questions after your visit or need to cancel or reschedule your appointment, please contact [facility] [place] [phone]  and follow the prompts.  Office hours are 8:00 a.m. to 4:30 p.m. Monday - Friday. Please note that voicemails left after 4:00 p.m. may not be returned until the following business day.  We are closed weekends and major holidays. You have access to a nurse at all times for urgent questions. Please call the main number to the clinic [phone] and follow the prompts.  For any non-urgent questions, you may also contact your provider using MyChart. We now offer e-Visits for anyone 18 and older to request care online for non-urgent symptoms. For details visit [email or web address].   Also download the MyChart app! Go to the app store, search "MyChart", open the app, select [place], and log in with your MyChart username and password.   

## 2023-04-28 NOTE — Progress Notes (Signed)
Patient taking calcium as directed. Denied tooth, jaw, and leg pain. No recent or upcoming dental visits. Labs reviewed. Patient tolerated injection with no complaints voiced. See MAR for details. Patient stable during and after injection. Site clean and dry with no bruising or swelling noted. Band aid applied. Vss with discharge and left in satisfactory condition with no s/s of distress.   

## 2023-05-07 ENCOUNTER — Inpatient Hospital Stay: Payer: Medicare Other

## 2023-05-11 ENCOUNTER — Other Ambulatory Visit: Payer: Self-pay | Admitting: *Deleted

## 2023-05-12 ENCOUNTER — Other Ambulatory Visit: Payer: Self-pay

## 2023-05-12 DIAGNOSIS — I10 Essential (primary) hypertension: Secondary | ICD-10-CM

## 2023-05-12 DIAGNOSIS — G4700 Insomnia, unspecified: Secondary | ICD-10-CM

## 2023-05-12 MED ORDER — POMALIDOMIDE 2 MG PO CAPS
ORAL_CAPSULE | ORAL | 0 refills | Status: DC
Start: 2023-05-12 — End: 2023-06-07

## 2023-05-12 NOTE — Telephone Encounter (Signed)
Chart reviewed. Pomalyst refilled per last office note with Dr. Katragadda.  

## 2023-05-14 ENCOUNTER — Other Ambulatory Visit: Payer: Self-pay | Admitting: Hematology

## 2023-05-17 DIAGNOSIS — Z6827 Body mass index (BMI) 27.0-27.9, adult: Secondary | ICD-10-CM | POA: Diagnosis not present

## 2023-05-17 DIAGNOSIS — J329 Chronic sinusitis, unspecified: Secondary | ICD-10-CM | POA: Diagnosis not present

## 2023-05-17 DIAGNOSIS — J40 Bronchitis, not specified as acute or chronic: Secondary | ICD-10-CM | POA: Diagnosis not present

## 2023-05-17 DIAGNOSIS — R030 Elevated blood-pressure reading, without diagnosis of hypertension: Secondary | ICD-10-CM | POA: Diagnosis not present

## 2023-05-17 DIAGNOSIS — Z20828 Contact with and (suspected) exposure to other viral communicable diseases: Secondary | ICD-10-CM | POA: Diagnosis not present

## 2023-05-19 ENCOUNTER — Other Ambulatory Visit: Payer: Self-pay

## 2023-05-19 DIAGNOSIS — C7951 Secondary malignant neoplasm of bone: Secondary | ICD-10-CM

## 2023-05-21 ENCOUNTER — Inpatient Hospital Stay: Payer: Medicare Other

## 2023-05-21 VITALS — BP 132/68 | HR 53 | Temp 98.3°F | Resp 18 | Wt 195.0 lb

## 2023-05-21 DIAGNOSIS — N179 Acute kidney failure, unspecified: Secondary | ICD-10-CM

## 2023-05-21 DIAGNOSIS — C9000 Multiple myeloma not having achieved remission: Secondary | ICD-10-CM

## 2023-05-21 DIAGNOSIS — Z79899 Other long term (current) drug therapy: Secondary | ICD-10-CM | POA: Diagnosis not present

## 2023-05-21 DIAGNOSIS — C7951 Secondary malignant neoplasm of bone: Secondary | ICD-10-CM

## 2023-05-21 DIAGNOSIS — Z5112 Encounter for antineoplastic immunotherapy: Secondary | ICD-10-CM | POA: Diagnosis not present

## 2023-05-21 DIAGNOSIS — D508 Other iron deficiency anemias: Secondary | ICD-10-CM

## 2023-05-21 DIAGNOSIS — D702 Other drug-induced agranulocytosis: Secondary | ICD-10-CM

## 2023-05-21 MED ORDER — CETIRIZINE HCL 10 MG PO TABS
10.0000 mg | ORAL_TABLET | Freq: Once | ORAL | Status: AC
  Administered 2023-05-21: 10 mg via ORAL
  Filled 2023-05-21: qty 1

## 2023-05-21 MED ORDER — SODIUM CHLORIDE 0.9% FLUSH
10.0000 mL | Freq: Once | INTRAVENOUS | Status: AC | PRN
  Administered 2023-05-21: 10 mL

## 2023-05-21 MED ORDER — EPOETIN ALFA-EPBX 10000 UNIT/ML IJ SOLN: 20000.0000 [IU] | Freq: Once | INTRAMUSCULAR | Status: DC

## 2023-05-21 MED ORDER — SODIUM CHLORIDE 0.9 % IV SOLN
1000.0000 mg | Freq: Once | INTRAVENOUS | Status: AC
  Administered 2023-05-21: 1000 mg via INTRAVENOUS
  Filled 2023-05-21: qty 10

## 2023-05-21 MED ORDER — HEPARIN SOD (PORK) LOCK FLUSH 100 UNIT/ML IV SOLN
500.0000 [IU] | Freq: Once | INTRAVENOUS | Status: AC | PRN
  Administered 2023-05-21: 500 [IU]

## 2023-05-21 MED ORDER — SODIUM CHLORIDE 0.9 % IV SOLN: Freq: Once | INTRAVENOUS | Status: AC

## 2023-05-21 MED ORDER — ACETAMINOPHEN 325 MG PO TABS
650.0000 mg | ORAL_TABLET | Freq: Once | ORAL | Status: AC
  Administered 2023-05-21: 650 mg via ORAL
  Filled 2023-05-21: qty 2

## 2023-05-21 NOTE — Patient Instructions (Signed)
[facility]  Discharge Instructions: Thank you for choosing [facility] to provide your oncology and hematology care.  If you have a lab appointment with the Cancer Center - please note that after April 8th, 2024, all labs will be drawn in the cancer center.  You do not have to check in or register with the main entrance as you have in the past but will complete your check-in in the cancer center.  Wear comfortable clothing and clothing appropriate for easy access to any Portacath or PICC line.   We strive to give you quality time with your provider. You may need to reschedule your appointment if you arrive late (15 or more minutes).  Arriving late affects you and other patients whose appointments are after yours.  Also, if you miss three or more appointments without notifying the office, you may be dismissed from the clinic at the provider's discretion.      For prescription refill requests, have your pharmacy contact our office and allow 72 hours for refills to be completed.    Today you received the following Monoferric.  Ferric Derisomaltose Injection What is this medication? FERRIC DERISOMALTOSE (FER ik der EYE soe MAWL tose) treats low levels of iron in your body (iron deficiency anemia). Iron is a mineral that plays an important role in making red blood cells, which carry oxygen from your lungs to the rest of your body. This medicine may be used for other purposes; ask your health care provider or pharmacist if you have questions. COMMON BRAND NAME(S): MONOFERRIC What should I tell my care team before I take this medication? They need to know if you have any of these conditions: High levels of iron in the blood An unusual or allergic reaction to iron, other medications, foods, dyes, or preservatives Pregnant or trying to get pregnant Breastfeeding How should I use this medication? This medication is injected into a vein. It is given by your care team  in a hospital or clinic setting. Talk to your care team about the use of this medication in children. Special care may be needed. Overdosage: If you think you have taken too much of this medicine contact a poison control center or emergency room at once. NOTE: This medicine is only for you. Do not share this medicine with others. What if I miss a dose? It is important not to miss your dose. Call your care team if you are unable to keep an appointment. What may interact with this medication? Do not take this medication with any of the following: Deferoxamine Dimercaprol Other iron products This list may not describe all possible interactions. Give your health care provider a list of all the medicines, herbs, non-prescription drugs, or dietary supplements you use. Also tell them if you smoke, drink alcohol, or use illegal drugs. Some items may interact with your medicine. What should I watch for while using this medication? Visit your care team regularly. Tell your care team if your symptoms do not start to get better or if they get worse. You may need blood work done while you are taking this medication. You may need to follow a special diet. Talk to your care team. Foods that contain iron include whole grains/cereals, dried fruits, beans, or peas, leafy green vegetables, and organ meats (liver, kidney). What side effects may I notice from receiving this medication? Side effects that you should report to your care team as soon as possible: Allergic reactions--skin rash, itching, hives,  swelling of the face, lips, tongue, or throat Low blood pressure--dizziness, feeling faint or lightheaded, blurry vision Shortness of breath Side effects that usually do not require medical attention (report to your care team if they continue or are bothersome): Flushing Headache Joint pain Muscle pain Nausea Pain, redness, or irritation at injection site This list may not describe all possible side effects.  Call your doctor for medical advice about side effects. You may report side effects to FDA at 1-800-FDA-1088. Where should I keep my medication? This medication is given in a hospital or clinic and will not be stored at home. NOTE: This sheet is a summary. It may not cover all possible information. If you have questions about this medicine, talk to your doctor, pharmacist, or health care provider.  2024 Elsevier/Gold Standard (2022-06-22 00:00:00)      To help prevent nausea and vomiting after your treatment, we encourage you to take your nausea medication as directed.  BELOW ARE SYMPTOMS THAT SHOULD BE REPORTED IMMEDIATELY: *FEVER GREATER THAN 100.4 F (38 C) OR HIGHER *CHILLS OR SWEATING *NAUSEA AND VOMITING THAT IS NOT CONTROLLED WITH YOUR NAUSEA MEDICATION *UNUSUAL SHORTNESS OF BREATH *UNUSUAL BRUISING OR BLEEDING *URINARY PROBLEMS (pain or burning when urinating, or frequent urination) *BOWEL PROBLEMS (unusual diarrhea, constipation, pain near the anus) TENDERNESS IN MOUTH AND THROAT WITH OR WITHOUT PRESENCE OF ULCERS (sore throat, sores in mouth, or a toothache) UNUSUAL RASH, SWELLING OR PAIN  UNUSUAL VAGINAL DISCHARGE OR ITCHING   Items with * indicate a potential emergency and should be followed up as soon as possible or go to the Emergency Department if any problems should occur.  Please show the CHEMOTHERAPY ALERT CARD or IMMUNOTHERAPY ALERT CARD at check-in to the Emergency Department and triage nurse.  Should you have questions after your visit or need to cancel or reschedule your appointment, please contact [facility] [phone]  and follow the prompts.  Office hours are 8:00 a.m. to 4:30 p.m. Monday - Friday. Please note that voicemails left after 4:00 p.m. may not be returned until the following business day.  We are closed weekends and major holidays. You have access to a nurse at all times for urgent questions. Please call the main number to the  clinic [phone] and follow the prompts.  For any non-urgent questions, you may also contact your provider using MyChart. We now offer e-Visits for anyone 65 and older to request care online for non-urgent symptoms. For details visit [email or web address].   Also download the MyChart app! Go to the app store, search "MyChart", open the app, select [place], and log in with your MyChart username and password.

## 2023-05-21 NOTE — Progress Notes (Signed)
Patient presents today for iron infusion of Monoferric.  Patient is in satisfactory condition with no new complaints voiced.  Vital signs are stable.  We will proceed with infusion per provider orders.    Patient tolerated treatment well with no complaints voiced.  Patient left ambulatory in stable condition.  Vital signs stable at discharge.  Follow up as scheduled.

## 2023-05-22 ENCOUNTER — Other Ambulatory Visit: Payer: Self-pay

## 2023-05-25 ENCOUNTER — Ambulatory Visit: Payer: Medicare Other | Admitting: Hematology

## 2023-05-25 ENCOUNTER — Inpatient Hospital Stay: Payer: Medicare Other

## 2023-05-25 ENCOUNTER — Other Ambulatory Visit: Payer: Medicare Other

## 2023-05-25 VITALS — BP 113/72 | HR 53 | Resp 18

## 2023-05-25 VITALS — BP 114/67 | HR 51 | Temp 96.8°F | Resp 18 | Wt 188.2 lb

## 2023-05-25 DIAGNOSIS — C9000 Multiple myeloma not having achieved remission: Secondary | ICD-10-CM

## 2023-05-25 DIAGNOSIS — Z95828 Presence of other vascular implants and grafts: Secondary | ICD-10-CM

## 2023-05-25 DIAGNOSIS — Z5112 Encounter for antineoplastic immunotherapy: Secondary | ICD-10-CM | POA: Diagnosis not present

## 2023-05-25 DIAGNOSIS — C7951 Secondary malignant neoplasm of bone: Secondary | ICD-10-CM

## 2023-05-25 DIAGNOSIS — Z79899 Other long term (current) drug therapy: Secondary | ICD-10-CM | POA: Diagnosis not present

## 2023-05-25 LAB — COMPREHENSIVE METABOLIC PANEL
ALT: 18 U/L (ref 0–44)
AST: 13 U/L — ABNORMAL LOW (ref 15–41)
Albumin: 3.3 g/dL — ABNORMAL LOW (ref 3.5–5.0)
Alkaline Phosphatase: 27 U/L — ABNORMAL LOW (ref 38–126)
Anion gap: 13 (ref 5–15)
BUN: 19 mg/dL (ref 8–23)
CO2: 23 mmol/L (ref 22–32)
Calcium: 8.5 mg/dL — ABNORMAL LOW (ref 8.9–10.3)
Chloride: 100 mmol/L (ref 98–111)
Creatinine, Ser: 1.41 mg/dL — ABNORMAL HIGH (ref 0.61–1.24)
GFR, Estimated: 52 mL/min — ABNORMAL LOW (ref >60)
Glucose, Bld: 93 mg/dL (ref 70–99)
Potassium: 4.2 mmol/L (ref 3.5–5.1)
Sodium: 136 mmol/L (ref 135–145)
Total Bilirubin: 0.7 mg/dL (ref 0.3–1.2)
Total Protein: 5 g/dL — ABNORMAL LOW (ref 6.5–8.1)

## 2023-05-25 LAB — CBC WITH DIFFERENTIAL/PLATELET
Abs Immature Granulocytes: 0.02 10*3/uL (ref 0.00–0.07)
Basophils Absolute: 0.1 10*3/uL (ref 0.0–0.1)
Basophils Relative: 1 %
Eosinophils Absolute: 0.3 10*3/uL (ref 0.0–0.5)
Eosinophils Relative: 4 %
HCT: 30.5 % — ABNORMAL LOW (ref 39.0–52.0)
Hemoglobin: 10.1 g/dL — ABNORMAL LOW (ref 13.0–17.0)
Immature Granulocytes: 0 %
Lymphocytes Relative: 23 %
Lymphs Abs: 1.3 10*3/uL (ref 0.7–4.0)
MCH: 33.3 pg (ref 26.0–34.0)
MCHC: 33.1 g/dL (ref 30.0–36.0)
MCV: 100.7 fL — ABNORMAL HIGH (ref 80.0–100.0)
Monocytes Absolute: 1 10*3/uL (ref 0.1–1.0)
Monocytes Relative: 16 %
Neutro Abs: 3.3 10*3/uL (ref 1.7–7.7)
Neutrophils Relative %: 56 %
Platelets: 195 10*3/uL (ref 150–400)
RBC: 3.03 MIL/uL — ABNORMAL LOW (ref 4.22–5.81)
RDW: 16.7 % — ABNORMAL HIGH (ref 11.5–15.5)
WBC: 5.9 10*3/uL (ref 4.0–10.5)
nRBC: 0 % (ref 0.0–0.2)

## 2023-05-25 MED ORDER — CETIRIZINE HCL 10 MG PO TABS
10.0000 mg | ORAL_TABLET | Freq: Once | ORAL | Status: AC
  Administered 2023-05-25: 10 mg via ORAL
  Filled 2023-05-25: qty 1

## 2023-05-25 MED ORDER — SODIUM CHLORIDE 0.9% FLUSH
10.0000 mL | Freq: Once | INTRAVENOUS | Status: AC
  Administered 2023-05-25: 10 mL via INTRAVENOUS

## 2023-05-25 MED ORDER — LORATADINE 10 MG PO TABS
10.0000 mg | ORAL_TABLET | Freq: Once | ORAL | Status: DC
  Filled 2023-05-25: qty 1

## 2023-05-25 MED ORDER — DARATUMUMAB-HYALURONIDASE-FIHJ 1800-30000 MG-UT/15ML ~~LOC~~ SOLN
1800.0000 mg | Freq: Once | SUBCUTANEOUS | Status: AC
  Administered 2023-05-25: 1800 mg via SUBCUTANEOUS
  Filled 2023-05-25: qty 15

## 2023-05-25 MED ORDER — HEPARIN SOD (PORK) LOCK FLUSH 100 UNIT/ML IV SOLN
500.0000 [IU] | Freq: Once | INTRAVENOUS | Status: AC
  Administered 2023-05-25: 500 [IU] via INTRAVENOUS

## 2023-05-25 NOTE — Patient Instructions (Signed)
[facility]  Discharge Instructions: Thank you for choosing [facility] to provide your oncology and hematology care.  If you have a lab appointment with the Cancer Center - please note that after April 8th, 2024, all labs will be drawn in the cancer center.  You do not have to check in or register with the main entrance as you have in the past but will complete your check-in in the cancer center.  Wear comfortable clothing and clothing appropriate for easy access to any Portacath or PICC line.   We strive to give you quality time with your provider. You may need to reschedule your appointment if you arrive late (15 or more minutes).  Arriving late affects you and other patients whose appointments are after yours.  Also, if you miss three or more appointments without notifying the office, you may be dismissed from the clinic at the provider's discretion.      For prescription refill requests, have your pharmacy contact our office and allow 72 hours for refills to be completed.    Today you received the following chemotherapy and/or immunotherapy agents Daratumumab.   Daratumumab Injection What is this medication? DARATUMUMAB (dar a toom ue mab) treats multiple myeloma, a type of bone marrow cancer. It works by helping your immune system slow or stop the spread of cancer cells. It is a monoclonal antibody. This medicine may be used for other purposes; ask your health care provider or pharmacist if you have questions. COMMON BRAND NAME(S): DARZALEX What should I tell my care team before I take this medication? They need to know if you have any of these conditions: Hereditary fructose intolerance Infection, such as chickenpox, herpes, hepatitis B Lung or breathing disease, such as asthma, COPD An unusual or allergic reaction to daratumumab, sorbitol, other medications, foods, dyes, or preservatives Pregnant or trying to get pregnant Breastfeeding How should I  use this medication? This medication is injected into a vein. It is given by your care team in a hospital or clinic setting. Talk to your care team about the use of this medication in children. Special care may be needed. Overdosage: If you think you have taken too much of this medicine contact a poison control center or emergency room at once. NOTE: This medicine is only for you. Do not share this medicine with others. What if I miss a dose? Keep appointments for follow-up doses. It is important not to miss your dose. Call your care team if you are unable to keep an appointment. What may interact with this medication? Interactions have not been studied. This list may not describe all possible interactions. Give your health care provider a list of all the medicines, herbs, non-prescription drugs, or dietary supplements you use. Also tell them if you smoke, drink alcohol, or use illegal drugs. Some items may interact with your medicine. What should I watch for while using this medication? Your condition will be monitored carefully while you are receiving this medication. This medication can cause serious allergic reactions. To reduce your risk, your care team may give you other medication to take before receiving this one. Be sure to follow the directions from your care team. This medication can affect the results of blood tests to match your blood type. These changes can last for up to 6 months after the final dose. Your care team will do blood tests to match your blood type before you start treatment. Tell all of your care team that  you are being treated with this medication before receiving a blood transfusion. This medication can affect the results of some tests used to determine treatment response; extra tests may be needed to evaluate response. Talk to your care team if you wish to become pregnant or think you are pregnant. This medication can cause serious birth defects if taken during pregnancy  and for 3 months after the last dose. A reliable form of contraception is recommended while taking this medication and for 3 months after the last dose. Talk to your care team about effective forms of contraception. Do not breast-feed while taking this medication. What side effects may I notice from receiving this medication? Side effects that you should report to your care team as soon as possible: Allergic reactions--skin rash, itching, hives, swelling of the face, lips, tongue, or throat Infection--fever, chills, cough, sore throat, wounds that don't heal, pain or trouble when passing urine, general feeling of discomfort or being unwell Infusion reactions--chest pain, shortness of breath or trouble breathing, feeling faint or lightheaded Unusual bruising or bleeding Side effects that usually do not require medical attention (report to your care team if they continue or are bothersome): Constipation Diarrhea Fatigue Nausea Pain, tingling, or numbness in the hands or feet Swelling of the ankles, hands, or feet This list may not describe all possible side effects. Call your doctor for medical advice about side effects. You may report side effects to FDA at 1-800-FDA-1088. Where should I keep my medication? This medication is given in a hospital or clinic. It will not be stored at home. NOTE: This sheet is a summary. It may not cover all possible information. If you have questions about this medicine, talk to your doctor, pharmacist, or health care provider.  2024 Elsevier/Gold Standard (2022-10-22 00:00:00)       To help prevent nausea and vomiting after your treatment, we encourage you to take your nausea medication as directed.  BELOW ARE SYMPTOMS THAT SHOULD BE REPORTED IMMEDIATELY: *FEVER GREATER THAN 100.4 F (38 C) OR HIGHER *CHILLS OR SWEATING *NAUSEA AND VOMITING THAT IS NOT CONTROLLED WITH YOUR NAUSEA MEDICATION *UNUSUAL SHORTNESS OF BREATH *UNUSUAL BRUISING OR  BLEEDING *URINARY PROBLEMS (pain or burning when urinating, or frequent urination) *BOWEL PROBLEMS (unusual diarrhea, constipation, pain near the anus) TENDERNESS IN MOUTH AND THROAT WITH OR WITHOUT PRESENCE OF ULCERS (sore throat, sores in mouth, or a toothache) UNUSUAL RASH, SWELLING OR PAIN  UNUSUAL VAGINAL DISCHARGE OR ITCHING   Items with * indicate a potential emergency and should be followed up as soon as possible or go to the Emergency Department if any problems should occur.  Please show the CHEMOTHERAPY ALERT CARD or IMMUNOTHERAPY ALERT CARD at check-in to the Emergency Department and triage nurse.  Should you have questions after your visit or need to cancel or reschedule your appointment, please contact [facility] [phone]  and follow the prompts.  Office hours are 8:00 a.m. to 4:30 p.m. Monday - Friday. Please note that voicemails left after 4:00 p.m. may not be returned until the following business day.  We are closed weekends and major holidays. You have access to a nurse at all times for urgent questions. Please call the main number to the clinic [phone] and follow the prompts.  For any non-urgent questions, you may also contact your provider using MyChart. We now offer e-Visits for anyone 103 and older to request care online for non-urgent symptoms. For details visit [email or web address].   Also download the MyChart  app! Go to the app store, search "MyChart", open the app, select [place], and log in with your MyChart username and password.

## 2023-05-25 NOTE — Progress Notes (Signed)
Pharmacy has substituted cetirizine 10 mg orally x 1 as premedication for   Loratidine discontinued.  V.O. Dr Katragadda/Zaiah Eckerson, PharmD  

## 2023-05-25 NOTE — Progress Notes (Signed)
Patient presents today for chemotherapy infusion of SQ Dara.  Patient is in satisfactory condition with no new complaints voiced.  Vital signs are stable. Patient reports taking Tylenol and Decadron at 11:00am this morning today prior to arrival. Labs reviewed and all labs are within treatment parameters.  We will proceed with treatment per MD orders.    Patient tolerated treatment well with no complaints voiced.  Patient left ambulatory in stable condition.  Vital signs stable at discharge.  Follow up as scheduled.

## 2023-05-26 ENCOUNTER — Inpatient Hospital Stay: Payer: Medicare Other

## 2023-05-26 VITALS — BP 98/56 | HR 58 | Temp 98.5°F | Resp 18

## 2023-05-26 DIAGNOSIS — C9000 Multiple myeloma not having achieved remission: Secondary | ICD-10-CM

## 2023-05-26 DIAGNOSIS — C7951 Secondary malignant neoplasm of bone: Secondary | ICD-10-CM

## 2023-05-26 DIAGNOSIS — Z79899 Other long term (current) drug therapy: Secondary | ICD-10-CM | POA: Diagnosis not present

## 2023-05-26 DIAGNOSIS — Z5112 Encounter for antineoplastic immunotherapy: Secondary | ICD-10-CM | POA: Diagnosis not present

## 2023-05-26 MED ORDER — DENOSUMAB 120 MG/1.7ML ~~LOC~~ SOLN
120.0000 mg | Freq: Once | SUBCUTANEOUS | Status: AC
  Administered 2023-05-26: 120 mg via SUBCUTANEOUS
  Filled 2023-05-26: qty 1.7

## 2023-05-26 NOTE — Progress Notes (Signed)
Patient taking calcium as directed. Denied tooth, jaw, and leg pain. No recent or upcoming dental visits. Labs reviewed. Patient tolerated injection with no complaints voiced. See MAR for details. Patient stable during and after injection. Site clean and dry with no bruising or swelling noted. Band aid applied. Vss with discharge and left in satisfactory condition with no s/s of distress.   

## 2023-05-26 NOTE — Patient Instructions (Signed)
[facility]  Discharge Instructions: Thank you for choosing [facility] to provide your oncology and hematology care.  If you have a lab appointment with the Cancer Center - please note that after April 8th, 2024, all labs will be drawn in the cancer center.  You do not have to check in or register with the main entrance as you have in the past but will complete your check-in in the cancer center.  Wear comfortable clothing and clothing appropriate for easy access to any Portacath or PICC line.   We strive to give you quality time with your provider. You may need to reschedule your appointment if you arrive late (15 or more minutes).  Arriving late affects you and other patients whose appointments are after yours.  Also, if you miss three or more appointments without notifying the office, you may be dismissed from the clinic at the provider's discretion.      For prescription refill requests, have your pharmacy contact our office and allow 72 hours for refills to be completed.    Today you received the following Xgeva, return as scheduled.   To help prevent nausea and vomiting after your treatment, we encourage you to take your nausea medication as directed.  BELOW ARE SYMPTOMS THAT SHOULD BE REPORTED IMMEDIATELY: *FEVER GREATER THAN 100.4 F (38 C) OR HIGHER *CHILLS OR SWEATING *NAUSEA AND VOMITING THAT IS NOT CONTROLLED WITH YOUR NAUSEA MEDICATION *UNUSUAL SHORTNESS OF BREATH *UNUSUAL BRUISING OR BLEEDING *URINARY PROBLEMS (pain or burning when urinating, or frequent urination) *BOWEL PROBLEMS (unusual diarrhea, constipation, pain near the anus) TENDERNESS IN MOUTH AND THROAT WITH OR WITHOUT PRESENCE OF ULCERS (sore throat, sores in mouth, or a toothache) UNUSUAL RASH, SWELLING OR PAIN  UNUSUAL VAGINAL DISCHARGE OR ITCHING   Items with * indicate a potential emergency and should be followed up as soon as possible or go to the Emergency Department if any  problems should occur.  Please show the CHEMOTHERAPY ALERT CARD or IMMUNOTHERAPY ALERT CARD at check-in to the Emergency Department and triage nurse.  Should you have questions after your visit or need to cancel or reschedule your appointment, please contact [facility] [phone]  and follow the prompts.  Office hours are 8:00 a.m. to 4:30 p.m. Monday - Friday. Please note that voicemails left after 4:00 p.m. may not be returned until the following business day.  We are closed weekends and major holidays. You have access to a nurse at all times for urgent questions. Please call the main number to the clinic [phone] and follow the prompts.  For any non-urgent questions, you may also contact your provider using MyChart. We now offer e-Visits for anyone 18 and older to request care online for non-urgent symptoms. For details visit [email or web address].   Also download the MyChart app! Go to the app store, search "MyChart", open the app, select [place], and log in with your MyChart username and password.   

## 2023-06-01 DIAGNOSIS — J324 Chronic pansinusitis: Secondary | ICD-10-CM | POA: Diagnosis not present

## 2023-06-01 DIAGNOSIS — Z9889 Other specified postprocedural states: Secondary | ICD-10-CM | POA: Diagnosis not present

## 2023-06-01 DIAGNOSIS — C9000 Multiple myeloma not having achieved remission: Secondary | ICD-10-CM | POA: Diagnosis not present

## 2023-06-07 ENCOUNTER — Other Ambulatory Visit: Payer: Self-pay | Admitting: Physician Assistant

## 2023-06-07 ENCOUNTER — Other Ambulatory Visit: Payer: Self-pay

## 2023-06-07 DIAGNOSIS — G4700 Insomnia, unspecified: Secondary | ICD-10-CM

## 2023-06-07 DIAGNOSIS — C9000 Multiple myeloma not having achieved remission: Secondary | ICD-10-CM

## 2023-06-07 DIAGNOSIS — I10 Essential (primary) hypertension: Secondary | ICD-10-CM

## 2023-06-07 MED ORDER — POMALIDOMIDE 2 MG PO CAPS
ORAL_CAPSULE | ORAL | 0 refills | Status: DC
Start: 2023-06-07 — End: 2023-06-30

## 2023-06-07 NOTE — Telephone Encounter (Signed)
Chart reviewed. Pomalyst refilled per last office note with Dr. Katragadda.  

## 2023-06-14 DIAGNOSIS — H01002 Unspecified blepharitis right lower eyelid: Secondary | ICD-10-CM | POA: Diagnosis not present

## 2023-06-14 DIAGNOSIS — H35713 Central serous chorioretinopathy, bilateral: Secondary | ICD-10-CM | POA: Diagnosis not present

## 2023-06-14 DIAGNOSIS — H26493 Other secondary cataract, bilateral: Secondary | ICD-10-CM | POA: Diagnosis not present

## 2023-06-14 DIAGNOSIS — H01001 Unspecified blepharitis right upper eyelid: Secondary | ICD-10-CM | POA: Diagnosis not present

## 2023-06-15 ENCOUNTER — Inpatient Hospital Stay: Payer: Medicare Other | Attending: Hematology

## 2023-06-15 VITALS — BP 139/67 | HR 59 | Temp 99.0°F | Resp 18

## 2023-06-15 DIAGNOSIS — Z853 Personal history of malignant neoplasm of breast: Secondary | ICD-10-CM | POA: Diagnosis not present

## 2023-06-15 DIAGNOSIS — C9002 Multiple myeloma in relapse: Secondary | ICD-10-CM | POA: Insufficient documentation

## 2023-06-15 DIAGNOSIS — Z7961 Long term (current) use of immunomodulator: Secondary | ICD-10-CM | POA: Diagnosis not present

## 2023-06-15 DIAGNOSIS — K219 Gastro-esophageal reflux disease without esophagitis: Secondary | ICD-10-CM | POA: Insufficient documentation

## 2023-06-15 DIAGNOSIS — Z79810 Long term (current) use of selective estrogen receptor modulators (SERMs): Secondary | ICD-10-CM | POA: Diagnosis not present

## 2023-06-15 DIAGNOSIS — N189 Chronic kidney disease, unspecified: Secondary | ICD-10-CM | POA: Insufficient documentation

## 2023-06-15 DIAGNOSIS — Z5112 Encounter for antineoplastic immunotherapy: Secondary | ICD-10-CM | POA: Diagnosis not present

## 2023-06-15 DIAGNOSIS — I129 Hypertensive chronic kidney disease with stage 1 through stage 4 chronic kidney disease, or unspecified chronic kidney disease: Secondary | ICD-10-CM | POA: Diagnosis not present

## 2023-06-15 DIAGNOSIS — D631 Anemia in chronic kidney disease: Secondary | ICD-10-CM | POA: Diagnosis not present

## 2023-06-15 DIAGNOSIS — Z9221 Personal history of antineoplastic chemotherapy: Secondary | ICD-10-CM | POA: Insufficient documentation

## 2023-06-15 DIAGNOSIS — Z9484 Stem cells transplant status: Secondary | ICD-10-CM | POA: Insufficient documentation

## 2023-06-15 DIAGNOSIS — G629 Polyneuropathy, unspecified: Secondary | ICD-10-CM | POA: Diagnosis not present

## 2023-06-15 DIAGNOSIS — D801 Nonfamilial hypogammaglobulinemia: Secondary | ICD-10-CM | POA: Diagnosis not present

## 2023-06-15 DIAGNOSIS — Z7951 Long term (current) use of inhaled steroids: Secondary | ICD-10-CM | POA: Diagnosis not present

## 2023-06-15 DIAGNOSIS — Z923 Personal history of irradiation: Secondary | ICD-10-CM | POA: Insufficient documentation

## 2023-06-15 DIAGNOSIS — C9000 Multiple myeloma not having achieved remission: Secondary | ICD-10-CM

## 2023-06-15 DIAGNOSIS — Z95828 Presence of other vascular implants and grafts: Secondary | ICD-10-CM

## 2023-06-15 LAB — CBC WITH DIFFERENTIAL/PLATELET
Abs Immature Granulocytes: 0 10*3/uL (ref 0.00–0.07)
Band Neutrophils: 3 %
Basophils Absolute: 0 10*3/uL (ref 0.0–0.1)
Basophils Relative: 1 %
Eosinophils Absolute: 0.2 10*3/uL (ref 0.0–0.5)
Eosinophils Relative: 10 %
HCT: 28 % — ABNORMAL LOW (ref 39.0–52.0)
Hemoglobin: 9.1 g/dL — ABNORMAL LOW (ref 13.0–17.0)
Lymphocytes Relative: 46 %
Lymphs Abs: 1.1 10*3/uL (ref 0.7–4.0)
MCH: 33.8 pg (ref 26.0–34.0)
MCHC: 32.5 g/dL (ref 30.0–36.0)
MCV: 104.1 fL — ABNORMAL HIGH (ref 80.0–100.0)
Monocytes Absolute: 0.4 10*3/uL (ref 0.1–1.0)
Monocytes Relative: 16 %
Neutro Abs: 0.6 10*3/uL — ABNORMAL LOW (ref 1.7–7.7)
Neutrophils Relative %: 24 %
Platelets: 124 10*3/uL — ABNORMAL LOW (ref 150–400)
RBC: 2.69 MIL/uL — ABNORMAL LOW (ref 4.22–5.81)
RDW: 18.3 % — ABNORMAL HIGH (ref 11.5–15.5)
WBC: 2.4 10*3/uL — ABNORMAL LOW (ref 4.0–10.5)
nRBC: 0 % (ref 0.0–0.2)

## 2023-06-15 LAB — MAGNESIUM: Magnesium: 1.7 mg/dL (ref 1.7–2.4)

## 2023-06-15 LAB — COMPREHENSIVE METABOLIC PANEL
ALT: 12 U/L (ref 0–44)
AST: 14 U/L — ABNORMAL LOW (ref 15–41)
Albumin: 3 g/dL — ABNORMAL LOW (ref 3.5–5.0)
Alkaline Phosphatase: 26 U/L — ABNORMAL LOW (ref 38–126)
Anion gap: 7 (ref 5–15)
BUN: 16 mg/dL (ref 8–23)
CO2: 23 mmol/L (ref 22–32)
Calcium: 7.9 mg/dL — ABNORMAL LOW (ref 8.9–10.3)
Chloride: 106 mmol/L (ref 98–111)
Creatinine, Ser: 1.2 mg/dL (ref 0.61–1.24)
GFR, Estimated: >60 mL/min (ref >60)
Glucose, Bld: 91 mg/dL (ref 70–99)
Potassium: 4.1 mmol/L (ref 3.5–5.1)
Sodium: 136 mmol/L (ref 135–145)
Total Bilirubin: 0.7 mg/dL (ref 0.3–1.2)
Total Protein: 4.9 g/dL — ABNORMAL LOW (ref 6.5–8.1)

## 2023-06-15 MED ORDER — HEPARIN SOD (PORK) LOCK FLUSH 100 UNIT/ML IV SOLN
500.0000 [IU] | Freq: Once | INTRAVENOUS | Status: AC
  Administered 2023-06-15: 500 [IU] via INTRAVENOUS

## 2023-06-15 MED ORDER — SODIUM CHLORIDE 0.9% FLUSH
10.0000 mL | INTRAVENOUS | Status: DC | PRN
  Administered 2023-06-15: 10 mL via INTRAVENOUS

## 2023-06-15 NOTE — Patient Instructions (Signed)
[facility] [place]  Discharge Instructions: Thank you for choosing [facility] to provide your oncology and hematology care.  If you have a lab appointment with the Cancer Center - please note that after April 8th, 2024, all labs will be drawn in the cancer center.  You do not have to check in or register with the main entrance as you have in the past but will complete your check-in in the cancer center.  Wear comfortable clothing and clothing appropriate for easy access to any Portacath or PICC line.   We strive to give you quality time with your provider. You may need to reschedule your appointment if you arrive late (15 or more minutes).  Arriving late affects you and other patients whose appointments are after yours.  Also, if you miss three or more appointments without notifying the office, you may be dismissed from the clinic at the provider's discretion.      For prescription refill requests, have your pharmacy contact our office and allow 72 hours for refills to be completed.  To help prevent nausea and vomiting after your treatment, we encourage you to take your nausea medication as directed.  BELOW ARE SYMPTOMS THAT SHOULD BE REPORTED IMMEDIATELY: *FEVER GREATER THAN 100.4 F (38 C) OR HIGHER *CHILLS OR SWEATING *NAUSEA AND VOMITING THAT IS NOT CONTROLLED WITH YOUR NAUSEA MEDICATION *UNUSUAL SHORTNESS OF BREATH *UNUSUAL BRUISING OR BLEEDING *URINARY PROBLEMS (pain or burning when urinating, or frequent urination) *BOWEL PROBLEMS (unusual diarrhea, constipation, pain near the anus) TENDERNESS IN MOUTH AND THROAT WITH OR WITHOUT PRESENCE OF ULCERS (sore throat, sores in mouth, or a toothache) UNUSUAL RASH, SWELLING OR PAIN  UNUSUAL VAGINAL DISCHARGE OR ITCHING   Items with * indicate a potential emergency and should be followed up as soon as possible or go to the Emergency Department if any problems should occur.  Please show the CHEMOTHERAPY ALERT CARD or  IMMUNOTHERAPY ALERT CARD at check-in to the Emergency Department and triage nurse.  Should you have questions after your visit or need to cancel or reschedule your appointment, please contact [facility] [phone]  and follow the prompts.  Office hours are 8:00 a.m. to 4:30 p.m. Monday - Friday. Please note that voicemails left after 4:00 p.m. may not be returned until the following business day.  We are closed weekends and major holidays. You have access to a nurse at all times for urgent questions. Please call the main number to the clinic [phone] and follow the prompts.  For any non-urgent questions, you may also contact your provider using MyChart. We now offer e-Visits for anyone 18 and older to request care online for non-urgent symptoms. For details visit [email or web address].   Also download the MyChart app! Go to the app store, search "MyChart", open the app, select [facility], and log in with your MyChart username and password.   

## 2023-06-15 NOTE — Progress Notes (Signed)
Patients port flushed without difficulty.  Good blood return noted with no bruising or swelling noted at site.  Band aid applied.  VSS with discharge and left in satisfactory condition with no s/s of distress noted.   

## 2023-06-16 ENCOUNTER — Other Ambulatory Visit: Payer: Self-pay | Admitting: Hematology

## 2023-06-16 ENCOUNTER — Other Ambulatory Visit: Payer: Self-pay

## 2023-06-16 DIAGNOSIS — C9000 Multiple myeloma not having achieved remission: Secondary | ICD-10-CM

## 2023-06-16 LAB — KAPPA/LAMBDA LIGHT CHAINS
Kappa free light chain: 2 mg/L — ABNORMAL LOW (ref 3.3–19.4)
Kappa, lambda light chain ratio: 0.77 (ref 0.26–1.65)
Lambda free light chains: 2.6 mg/L — ABNORMAL LOW (ref 5.7–26.3)

## 2023-06-17 ENCOUNTER — Encounter: Payer: Self-pay | Admitting: Hematology

## 2023-06-17 ENCOUNTER — Encounter (HOSPITAL_COMMUNITY): Payer: Self-pay | Admitting: Hematology

## 2023-06-18 LAB — PROTEIN ELECTROPHORESIS, SERUM
A/G Ratio: 2.1 — ABNORMAL HIGH (ref 0.7–1.7)
Albumin ELP: 2.9 g/dL (ref 2.9–4.4)
Alpha-1-Globulin: 0.2 g/dL (ref 0.0–0.4)
Alpha-2-Globulin: 0.5 g/dL (ref 0.4–1.0)
Beta Globulin: 0.6 g/dL — ABNORMAL LOW (ref 0.7–1.3)
Gamma Globulin: 0.1 g/dL — ABNORMAL LOW (ref 0.4–1.8)
Globulin, Total: 1.4 g/dL — ABNORMAL LOW (ref 2.2–3.9)
M-Spike, %: 0.1 g/dL — ABNORMAL HIGH
Total Protein ELP: 4.3 g/dL — ABNORMAL LOW (ref 6.0–8.5)

## 2023-06-21 LAB — IMMUNOFIXATION ELECTROPHORESIS
IgA: <5 mg/dL — ABNORMAL LOW (ref 61–437)
IgG (Immunoglobin G), Serum: 194 mg/dL — ABNORMAL LOW (ref 603–1613)
IgM (Immunoglobulin M), Srm: 15 mg/dL (ref 15–143)
Total Protein ELP: 4.5 g/dL — ABNORMAL LOW (ref 6.0–8.5)

## 2023-06-21 NOTE — Progress Notes (Signed)
[facility] [name] [place] [address]    Clinic Day:  06/22/2023  Referring physician: Doreatha Massed, MD  Patient Care Team: Doreatha Massed, MD as PCP - General (Hematology) Doreatha Massed, MD as Consulting Physician (Oncology)   ASSESSMENT & PLAN:   Assessment: 1.  Relapsed multiple myeloma: -Autologous stem cell transplant on 10/08/2017 -Post transplant consolidation with CyCarD for 2 cycles with M spike undetectable. -Maintenance therapy with carfilzomib 70 mg per metered square days 1 and 15 every 28 days from 04/20/2018, dose reduced to 35 mg per metered square on 01/05/2019, titrated up to 56 mg per metered square on 01/09/2020. 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 -PET scan on 02/18/2020 showed new left supraclavicular lymph node at the thoracic inlet, SUV 14.2.  Multifocal osseous lesions largely improved, though residual lesions noted including right acromion, left acromion, thyroid cartilage, right sternum.  Additional lesions throughout the sternum, bilateral ribs, thoracolumbar spine, bilateral pelvis have resolved. -BMBX on 02/14/2020 shows plasma cell myeloma, 60-70% of cells.  FISH for myeloma was negative.  Cytogenetics was normal. -4 cycles of daratumumab, bortezomib and dexamethasone from 02/28/2020 through 04/29/2020. -Daratumumab, pomalidomide and dexamethasone started on 05/28/2020.  He is receiving daratumumab every 2 weeks, Pomalyst 3 mg days 1-21, dexamethasone 20 mg weekly. -Pomalidomide dose reduced to 2 mg days 1-21 around the first week of July 2021, due to severe weakness.    Plan: 1.  Relapsed IgG kappa multiple myeloma: - He is tolerating pomalidomide and Darzalex very well. - Reviewed labs from 06/15/2023: M spike is stable at 0.1 g.  Free light chain ratio is 0.77.  Immunofixation shows IgG kappa. - Mild leukopenia and thrombocytopenia stable.  LFTs are normal with  hypoalbuminemia. - Continue pomalidomide 2 mg 3 weeks on/1 week off, Darzalex monthly and 20 mg dexamethasone on Darzalex days.  As his ANC is 0.6, he will also receive G-CSF today.  RTC 8 weeks for follow-up with repeat myeloma labs.   2.  Myeloma bone disease: - Continue monthly denosumab.  Continue calcium supplements.   3.  Macrocytic anemia: - Anemia from CKD, relative iron deficiency, myelosuppression. - He received Monoferric 1 g on 05/21/2023.  Latest hemoglobin is 9.1.   4.  ID prophylaxis: - Continue acyclovir twice daily.   5.  Peripheral neuropathy: - Continue gabapentin 600 mg 3 times daily.   6.  History of breast cancer: - Continue tamoxifen daily.  7.  Hypogammaglobulinemia: - IgG levels are severely low.  He does not report any recurrent infections.  Will consider IVIG if recurrent infections.    Orders Placed This Encounter  Procedures   Kappa/lambda light chains    Standing Status:   Standing    Number of Occurrences:   10    Standing Expiration Date:   06/20/2024   Protein electrophoresis, serum    Standing Status:   Standing    Number of Occurrences:   10    Standing Expiration Date:   06/20/2024   Immunofixation electrophoresis    Standing Status:   Standing    Number of Occurrences:   10    Standing Expiration Date:   06/20/2024   CBC with Differential    Standing Status:   Future    Standing Expiration Date:   10/12/2024   CBC with Differential    Standing Status:   Future    Standing Expiration Date:   11/09/2024  I,Katie Daubenspeck,acting as a Neurosurgeon for Doreatha Massed, MD.,have documented all relevant documentation on the behalf of Doreatha Massed, MD,as directed by  Doreatha Massed, MD while in the presence of Doreatha Massed, MD.   I, Doreatha Massed MD, have reviewed the above documentation for accuracy and completeness, and I agree with the above.   Doreatha Massed, MD   6/25/20246:24 PM  CHIEF COMPLAINT:    Diagnosis: multiple myeloma    Cancer Staging  Multiple myeloma ([name] Staging form: Plasma Cell Myeloma and Plasma Cell Disorders, AJCC 8th Edition - Clinical stage from 05/03/2017: Beta-2-microglobulin (mg/L): 3.5, Albumin (g/dL): 3.4, ISS: Stage II, High-risk cytogenetics: Absent, LDH: Not assessed - Signed by Ellouise Newer, PA-C on 05/03/2017    Prior Therapy: 1. RVD x 6 cycles from 03/01/2017 to 06/29/2017. 2. Stem cell transplant on 09/30/2017. 3. Carfilzomib and cyclophosphamide x 2 cycles from 02/14/2018 to 03/29/2018. 4. Carfilzomib x 22 cycles from 04/20/2018 to 01/23/2020.  Current Therapy:  Darzalex Faspro monthly; Pomalyst 2 mg 3/4 weeks; Xgeva monthly    HISTORY OF PRESENT ILLNESS:   Oncology History  Breast cancer, male ([name]  12/31/2009 Initial Biopsy   Biopsy of L breast    12/31/2009 Pathology Results   Invasive ductal carcinoma, ER/PR+, HER 2 negative   12/31/2009 Imaging   Ultrasound showing a 2.43 x 1.85 x 3 cm hypoechoic spiculated mass in the 12 o clock L breast retroareolar region   01/01/2010 -  Anti-estrogen oral therapy   Tamoxifen 20 mg daily   01/06/2010 Imaging   Bone scan abnormal uptake in the diaphysis of the R humerus, abnormal in the R third, fifth and sixth ribs, lesion also noted in the sternum.   02/03/2010 Surgery   Rod placement and fixation of R humerus by Dr. Amanda Pea   02/05/2010 - 02/18/2010 Radiation Therapy   30Gy in 10 fractions of 3 Gy per fraction to R pathologic fracture   03/11/2010 -  Chemotherapy   Denosumab monthly, now every 3 months. Started at [facility]    06/09/2016 Imaging   Three hypermetabolic osseous lesions in the sternum, left ilium and right ilium, as discussed above, likely represent osseous metastases. At this time, these are not recognizable on the CT images. 2. No extra skeletal metastatic disease identified in the neck, chest, abdomen or pelvis.   10/13/2016 Progression   PET shows various new and enlarging  osseous metastatic lesions with no definite extra osseous metastatic disease currently identified.    12/31/2016 Progression   1. Multifocal hypermetabolic osseous metastases throughout the axial and proximal appendicular skeleton, which are increased in size, number and metabolism since 10/13/2016 PET-CT. 2. 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 3. No additional sites of hypermetabolic metastatic disease. 4. Chronic right mastoid sinusitis. 5. Aortic atherosclerosis.  One vessel coronary atherosclerosis.   Multiple myeloma ([name]  02/12/2017 Bone Marrow Biopsy   The marrow was variably cellular with large peritrabecular aggregates of kappa restricted plasma cells (66% by aspirate, 30% by Cd138). Cytogenetics +11.    03/01/2017 - 06/29/2017 Chemotherapy   RVD    05/26/2017 Bone Marrow Biopsy   Performed at [facility]:  Plasma cell myeloma in a 30% cellular marrow with decreased trilineage hematopoiesis and 42% kappa light chain restricted plasma cells on the aspirate smears and large aggregates on the core biopsy.     07/12/2017 - 09/01/2017 Chemotherapy   3 cycles of carfizolmib/cyclophosphamide/dexamethasone  10/08/2017 Bone Marrow Transplant   Autotransplant at [facility]   02/28/2020 - 09/01/2022 Chemotherapy   Patient is on Treatment Plan : Daratumumab q28d     09/01/2022 -  Chemotherapy   Patient is on Treatment Plan : MYELOMA Daratumumab Faspro Subq q28d     Multiple myeloma not having achieved remission ([name]  06/29/2017 Initial Diagnosis   Multiple myeloma not having achieved remission ([name]   02/28/2020 - 09/01/2022 Chemotherapy   Patient is on Treatment Plan : Daratumumab q28d     09/01/2022 -  Chemotherapy   Patient is on Treatment Plan : MYELOMA Daratumumab Faspro Subq q28d        INTERVAL HISTORY:   Opie is a 77 y.o. male  presenting to clinic today for follow up of multiple myeloma. He was last seen by me on 04/27/23.  Today, he states that he is doing well overall. His appetite level is at 80%. His energy level is at 80%.  PAST MEDICAL HISTORY:   Past Medical History: Past Medical History:  Diagnosis Date   Anxiety    Bone metastases 09/10/2016   Breast cancer, male [facility]) 2011   Stage IV breast cancer; radiation and tamoxifen  Overview:  Left breast ca with mets bone  Overview:  METS TO BONE   Bronchitis    uses inhaler   Cough    uses inhaler   GERD (gastroesophageal reflux disease)    Headache    History of blood transfusion    Hypertension    Macular degeneration    bilateral   Memory loss    mild   Multiple myeloma ([name] 02/18/2017   Peripheral neuropathy    feet   Pneumonia    mild case 1-2 yrs ago (2023 or 2024)    Surgical History: Past Surgical History:  Procedure Laterality Date   BACK SURGERY     C2-C3 fusion   BREAST BIOPSY Left    cancer   CATARACT EXTRACTION W/PHACO Left 02/03/2019   Procedure: CATARACT EXTRACTION PHACO AND INTRAOCULAR LENS PLACEMENT (IOC);  Surgeon: Fabio Pierce, MD;  Location: [facility];  Service: Ophthalmology;  Laterality: Left;  CDE: 15.89   CATARACT EXTRACTION W/PHACO Right 07/25/2021   Procedure: CATARACT EXTRACTION PHACO AND INTRAOCULAR LENS PLACEMENT (IOC);  Surgeon: Fabio Pierce, MD;  Location: [facility];  Service: Ophthalmology;  Laterality: Right;  CDE   26.71   COLONOSCOPY     HERNIA REPAIR     PORTACATH PLACEMENT Left 06/10/2018   Procedure: INSERTION PORT-A-CATH;  Surgeon: Lucretia Roers, MD;  Location: [facility];  Service: General;  Laterality: Left;   right arm surgery     bone cancer and rod placed   SINUS ENDO WITH FUSION Bilateral 03/25/2023   Procedure: FUNCTIONAL ENDOSCOPIC SINUS SURGERY WITH FUSION AND NAVIGATION;  Surgeon: [name], MD;  Location: [facility];  Service: ENT;  Laterality: Bilateral;    Social History: Social  History   Socioeconomic History   Marital status: Married    Spouse name: Not on file   Number of children: 3   Years of education: Not on file   Highest education level: Not on file  Occupational History   Not on file  Tobacco Use   Smoking status: Never   Smokeless tobacco: Former    Types: Associate Professor Use: Never used  Substance and Sexual Activity   Alcohol use: Yes    Alcohol/week: 24.0 standard drinks of alcohol  Types: 24 Cans of beer per week    Comment: No ETOH in the last 6 months as of 02/2023   Drug use: No   Sexual activity: Yes  Other Topics Concern   Not on file  Social History Narrative   Not on file   Social Determinants of Health   Financial Resource Strain: Low Risk  (11/26/2020)   Overall Financial Resource Strain (CARDIA)    Difficulty of Paying Living Expenses: Not hard at all  Food Insecurity: No Food Insecurity (11/26/2020)   Hunger Vital Sign    Worried About Running Out of Food in the Last Year: Never true    Ran Out of Food in the Last Year: Never true  Transportation Needs: No Transportation Needs (11/26/2020)   PRAPARE - Administrator, Civil Service (Medical): No    Lack of Transportation (Non-Medical): No  Physical Activity: Inactive (11/26/2020)   Exercise Vital Sign    Days of Exercise per Week: 0 days    Minutes of Exercise per Session: 0 min  Stress: Stress Concern Present (11/26/2020)   [employer] of Occupational Health - Occupational Stress Questionnaire    Feeling of Stress : To some extent  Social Connections: Moderately Integrated (11/26/2020)   Social Connection and Isolation Panel [NHANES]    Frequency of Communication with Friends and Family: More than three times a week    Frequency of Social Gatherings with Friends and Family: Three times a week    Attends Religious Services: 1 to 4 times per year    Active Member of Clubs or Organizations: No    Attends Banker Meetings:  Never    Marital Status: Married  Catering manager Violence: Not At Risk (11/26/2020)   Humiliation, Afraid, Rape, and Kick questionnaire    Fear of Current or Ex-Partner: No    Emotionally Abused: No    Physically Abused: No    Sexually Abused: No    Family History: Family History  Problem Relation Age of Onset   Stroke Mother    Cancer Maternal Aunt        cancer NOS; died in her 20s   Lung cancer Maternal Uncle        smoker    Current Medications:  Current Outpatient Medications:    acetaminophen (TYLENOL) 500 MG tablet, Take 1,000 mg by mouth daily., Disp: , Rfl:    acyclovir (ZOVIRAX) 800 MG tablet, TAKE 1 TABLET TWICE DAILY, Disp: 180 tablet, Rfl: 3   albuterol (VENTOLIN HFA) 108 (90 Base) MCG/ACT inhaler, Inhale 2 puffs into the lungs every 6 (six) hours as needed for wheezing or shortness of breath., Disp: , Rfl:    ALPRAZolam (XANAX) 0.5 MG tablet, TAKE 1 TABLET BY MOUTH AT BEDTIME AS NEEDED FOR anxiety / SLEEP, Disp: 30 tablet, Rfl: 5   aspirin EC 81 MG tablet, Take 81 mg by mouth daily. , Disp: , Rfl:    budesonide (PULMICORT) 0.5 MG/2ML nebulizer solution, Add 2 mL (1 vial) of budesonide into 240 mL saline irrigation bottle and irrigate both nostrils morning and night (one bottle total per day)., Disp: , Rfl:    denosumab (XGEVA) 120 MG/1.7ML SOLN injection, Inject 120 mg into the skin every 30 (thirty) days. , Disp: , Rfl:    dexamethasone (DECADRON) 4 MG tablet, TAKE FIVE TABLETS BY MOUTH WEEKLY, Disp: 40 tablet, Rfl: 5   folic acid (FOLVITE) 1 MG tablet, TAKE 1 TABLET BY MOUTH DAILY, Disp: 90 tablet, Rfl: 2  gabapentin (NEURONTIN) 300 MG capsule, TAKE 1 CAPSULE THREE TIMES DAILY, Disp: 180 capsule, Rfl: 5   Loperamide HCl (IMODIUM PO), Take 2 mg by mouth daily as needed (diarrhea)., Disp: , Rfl:    loratadine (CLARITIN) 10 MG tablet, Take 1 tablet (10 mg total) by mouth daily., Disp: 90 tablet, Rfl: 6   metoprolol succinate (TOPROL-XL) 100 MG 24 hr tablet, Take 100  mg by mouth in the morning., Disp: , Rfl:    ondansetron (ZOFRAN) 8 MG tablet, TAKE 1 TABLET BY MOUTH EVERY 8 HOURS AS NEEDED FOR NAUSEA AND VOMITING, Disp: 30 tablet, Rfl: 2   pantoprazole (PROTONIX) 40 MG tablet, TAKE 1 TABLET EVERY OTHER DAY, Disp: 45 tablet, Rfl: 6   polyethylene glycol (MIRALAX / GLYCOLAX) packet, Take 17 g by mouth daily as needed for mild constipation., Disp: 14 each, Rfl: 0   pomalidomide (POMALYST) 2 MG capsule, TAKE 1 CAPSULE BY MOUTH EVERY DAY FOR 21 DAYS ON FOLLOWED BY 7 DAYS OFF, Disp: 21 capsule, Rfl: 0   sildenafil (VIAGRA) 100 MG tablet, 100 mg as needed for erectile dysfunction., Disp: , Rfl:    tamoxifen (NOLVADEX) 20 MG tablet, TAKE 1 TABLET EVERY DAY, Disp: 90 tablet, Rfl: 3 No current facility-administered medications for this visit.  Facility-Administered Medications Ordered in Other Visits:    ondansetron (ZOFRAN) 8 mg in sodium chloride 0.9 % 50 mL IVPB, 8 mg, Intravenous, Once, Lockamy, Randi L, NP-C   sodium chloride flush (NS) 0.9 % injection 10 mL, 10 mL, Intracatheter, Once PRN, Doreatha Massed, MD   Allergies: Allergies  Allergen Reactions   Cefepime     Suspected severe thrombocytopenia is a result of cefepime induced antigen platelet destruction    REVIEW OF SYSTEMS:   Review of Systems  Constitutional:  Negative for chills, fatigue and fever.  HENT:   Negative for lump/mass, mouth sores, nosebleeds, sore throat and trouble swallowing.   Eyes:  Negative for eye problems.  Respiratory:  Negative for cough and shortness of breath.   Cardiovascular:  Negative for chest pain, leg swelling and palpitations.  Gastrointestinal:  Negative for abdominal pain, constipation, diarrhea, nausea and vomiting.  Genitourinary:  Negative for bladder incontinence, difficulty urinating, dysuria, frequency, hematuria and nocturia.   Musculoskeletal:  Negative for arthralgias, back pain, flank pain, myalgias and neck pain.  Skin:  Negative for itching  and rash.  Neurological:  Positive for numbness. Negative for dizziness and headaches.  Hematological:  Does not bruise/bleed easily.  Psychiatric/Behavioral:  Negative for depression, sleep disturbance and suicidal ideas. The patient is not nervous/anxious.   All other systems reviewed and are negative.    VITALS:   Blood pressure 139/73, pulse (!) 59, temperature 98.6 F (37 C), temperature source Tympanic, resp. rate 16, weight 193 lb 14.4 oz (88 kg), SpO2 98 %.  Wt Readings from Last 3 Encounters:  06/22/23 193 lb 14.4 oz (88 kg)  05/25/23 188 lb 3.2 oz (85.4 kg)  05/21/23 195 lb (88.5 kg)    Body mass index is 28.63 kg/m.  Performance status (ECOG): 1 - Symptomatic but completely ambulatory  PHYSICAL EXAM:   Physical Exam Vitals and nursing note reviewed. Exam conducted with a chaperone present.  Constitutional:      Appearance: Normal appearance.  Cardiovascular:     Rate and Rhythm: Normal rate and regular rhythm.     Pulses: Normal pulses.     Heart sounds: Normal heart sounds.  Pulmonary:     Effort: Pulmonary effort is normal.  Breath sounds: Normal breath sounds.  Abdominal:     Palpations: Abdomen is soft. There is no hepatomegaly, splenomegaly or mass.     Tenderness: There is no abdominal tenderness.  Musculoskeletal:     Right lower leg: No edema.     Left lower leg: No edema.  Lymphadenopathy:     Cervical: No cervical adenopathy.     Right cervical: No superficial, deep or posterior cervical adenopathy.    Left cervical: No superficial, deep or posterior cervical adenopathy.     Upper Body:     Right upper body: No supraclavicular or axillary adenopathy.     Left upper body: No supraclavicular or axillary adenopathy.  Neurological:     General: No focal deficit present.     Mental Status: He is alert and oriented to person, place, and time.  Psychiatric:        Mood and Affect: Mood normal.        Behavior: Behavior normal.     LABS:       Latest Ref Rng & Units 06/15/2023   11:27 AM 05/25/2023   11:22 AM 04/27/2023    9:03 AM  CBC  WBC 4.0 - 10.5 K/uL 2.4  5.9  3.1   Hemoglobin 13.0 - 17.0 g/dL 9.1  13.2  8.9   Hematocrit 39.0 - 52.0 % 28.0  30.5  27.7   Platelets 150 - 400 K/uL 124  195  155       Latest Ref Rng & Units 06/15/2023   11:27 AM 05/25/2023   11:22 AM 04/27/2023    9:03 AM  CMP  Glucose 70 - 99 mg/dL 91  93  96   BUN 8 - 23 mg/dL 16  19  16    Creatinine 0.61 - 1.24 mg/dL 4.40  1.02  7.25   Sodium 135 - 145 mmol/L 136  136  136   Potassium 3.5 - 5.1 mmol/L 4.1  4.2  4.3   Chloride 98 - 111 mmol/L 106  100  107   CO2 22 - 32 mmol/L 23  23  24    Calcium 8.9 - 10.3 mg/dL 7.9  8.5  8.1   Total Protein 6.5 - 8.1 g/dL 4.9  5.0  5.2   Total Bilirubin 0.3 - 1.2 mg/dL 0.7  0.7  0.8   Alkaline Phos 38 - 126 U/L 26  27  26    AST 15 - 41 U/L 14  13  15    ALT 0 - 44 U/L 12  18  12       No results found for: "CEA1", "CEA" / No results found for: "CEA1", "CEA" No results found for: "PSA1" No results found for: "CAN199" No results found for: "CAN125"  Lab Results  Component Value Date   TOTALPROTELP 4.5 (L) 06/15/2023   TOTALPROTELP 4.3 (L) 06/15/2023   ALBUMINELP 2.9 06/15/2023   A1GS 0.2 06/15/2023   A2GS 0.5 06/15/2023   BETS 0.6 (L) 06/15/2023   GAMS 0.1 (L) 06/15/2023   MSPIKE 0.1 (H) 06/15/2023   SPEI Comment 06/15/2023   Lab Results  Component Value Date   TIBC 314 03/02/2023   TIBC 308 08/25/2022   TIBC 325 03/25/2020   FERRITIN 161 03/02/2023   FERRITIN 170 08/25/2022   FERRITIN 640 (H) 03/25/2020   IRONPCTSAT 11 (L) 03/02/2023   IRONPCTSAT 24 08/25/2022   IRONPCTSAT 10 (L) 03/25/2020   Lab Results  Component Value Date   LDH 131 11/18/2021   LDH 127 09/30/2021  LDH 102 08/05/2021     STUDIES:   No results found.

## 2023-06-22 ENCOUNTER — Inpatient Hospital Stay (HOSPITAL_BASED_OUTPATIENT_CLINIC_OR_DEPARTMENT_OTHER): Payer: Medicare Other | Admitting: Hematology

## 2023-06-22 ENCOUNTER — Other Ambulatory Visit: Payer: Medicare Other

## 2023-06-22 ENCOUNTER — Inpatient Hospital Stay: Payer: Medicare Other

## 2023-06-22 VITALS — BP 139/73 | HR 59 | Temp 98.6°F | Resp 16 | Wt 193.9 lb

## 2023-06-22 DIAGNOSIS — D702 Other drug-induced agranulocytosis: Secondary | ICD-10-CM

## 2023-06-22 DIAGNOSIS — C9000 Multiple myeloma not having achieved remission: Secondary | ICD-10-CM

## 2023-06-22 DIAGNOSIS — Z79810 Long term (current) use of selective estrogen receptor modulators (SERMs): Secondary | ICD-10-CM | POA: Diagnosis not present

## 2023-06-22 DIAGNOSIS — N189 Chronic kidney disease, unspecified: Secondary | ICD-10-CM | POA: Diagnosis not present

## 2023-06-22 DIAGNOSIS — C9002 Multiple myeloma in relapse: Secondary | ICD-10-CM | POA: Diagnosis not present

## 2023-06-22 DIAGNOSIS — K219 Gastro-esophageal reflux disease without esophagitis: Secondary | ICD-10-CM | POA: Diagnosis not present

## 2023-06-22 DIAGNOSIS — C7951 Secondary malignant neoplasm of bone: Secondary | ICD-10-CM

## 2023-06-22 DIAGNOSIS — Z5112 Encounter for antineoplastic immunotherapy: Secondary | ICD-10-CM | POA: Diagnosis not present

## 2023-06-22 DIAGNOSIS — D801 Nonfamilial hypogammaglobulinemia: Secondary | ICD-10-CM | POA: Diagnosis not present

## 2023-06-22 DIAGNOSIS — N179 Acute kidney failure, unspecified: Secondary | ICD-10-CM

## 2023-06-22 MED ORDER — EPOETIN ALFA-EPBX 10000 UNIT/ML IJ SOLN: 20000.0000 [IU] | Freq: Once | INTRAMUSCULAR | Status: DC

## 2023-06-22 MED ORDER — DARATUMUMAB-HYALURONIDASE-FIHJ 1800-30000 MG-UT/15ML ~~LOC~~ SOLN
1800.0000 mg | Freq: Once | SUBCUTANEOUS | Status: AC
  Administered 2023-06-22: 1800 mg via SUBCUTANEOUS
  Filled 2023-06-22: qty 15

## 2023-06-22 MED ORDER — FILGRASTIM-AYOW 480 MCG/0.8ML ~~LOC~~ SOSY
480.0000 ug | PREFILLED_SYRINGE | Freq: Once | SUBCUTANEOUS | Status: AC
  Administered 2023-06-22: 480 ug via SUBCUTANEOUS
  Filled 2023-06-22: qty 0.8

## 2023-06-22 NOTE — Patient Instructions (Signed)
[name]  Discharge Instructions: Thank you for choosing [facility] to provide your oncology and hematology care.  If you have a lab appointment with the Cancer Center - please note that after April 8th, 2024, all labs will be drawn in the cancer center.  You do not have to check in or register with the main entrance as you have in the past but will complete your check-in in the cancer center.  Wear comfortable clothing and clothing appropriate for easy access to any Portacath or PICC line.   We strive to give you quality time with your provider. You may need to reschedule your appointment if you arrive late (15 or more minutes).  Arriving late affects you and other patients whose appointments are after yours.  Also, if you miss three or more appointments without notifying the office, you may be dismissed from the clinic at the provider's discretion.      For prescription refill requests, have your pharmacy contact our office and allow 72 hours for refills to be completed.    Today you received the following chemotherapy and/or immunotherapy agents Daratumumab/Releuko.  Daratumumab; Hyaluronidase Injection What is this medication? DARATUMUMAB; HYALURONIDASE (dar a toom ue mab; hye al ur ON i dase) treats multiple myeloma, a type of bone marrow cancer. Daratumumab works by blocking a protein that causes cancer cells to grow and multiply. This helps to slow or stop the spread of cancer cells. Hyaluronidase works by increasing the absorption of other medications in the body to help them work better. This medication may also be used treat amyloidosis, a condition that causes the buildup of a protein (amyloid) in your body. It works by reducing the buildup of this protein, which decreases symptoms. It is a combination medication that contains a monoclonal antibody. This medicine may be used for other purposes; ask your health care provider or pharmacist if you have  questions. COMMON BRAND NAME(S): DARZALEX FASPRO What should I tell my care team before I take this medication? They need to know if you have any of these conditions: Heart disease Infection, such as chickenpox, cold sores, herpes, hepatitis B Lung or breathing disease An unusual or allergic reaction to daratumumab, hyaluronidase, other medications, foods, dyes, or preservatives Pregnant or trying to get pregnant Breast-feeding How should I use this medication? This medication is injected under the skin. It is given by your care team in a hospital or clinic setting. Talk to your care team about the use of this medication in children. Special care may be needed. Overdosage: If you think you have taken too much of this medicine contact a poison control center or emergency room at once. NOTE: This medicine is only for you. Do not share this medicine with others. What if I miss a dose? Keep appointments for follow-up doses. It is important not to miss your dose. Call your care team if you are unable to keep an appointment. What may interact with this medication? Interactions have not been studied. This list may not describe all possible interactions. Give your health care provider a list of all the medicines, herbs, non-prescription drugs, or dietary supplements you use. Also tell them if you smoke, drink alcohol, or use illegal drugs. Some items may interact with your medicine. What should I watch for while using this medication? Your condition will be monitored carefully while you are receiving this medication. This medication can cause serious allergic reactions. To reduce your risk, your care team may  give you other medication to take before receiving this one. Be sure to follow the directions from your care team. This medication can affect the results of blood tests to match your blood type. These changes can last for up to 6 months after the final dose. Your care team will do blood tests to  match your blood type before you start treatment. Tell all of your care team that you are being treated with this medication before receiving a blood transfusion. This medication can affect the results of some tests used to determine treatment response; extra tests may be needed to evaluate response. Talk to your care team if you wish to become pregnant or think you are pregnant. This medication can cause serious birth defects if taken during pregnancy and for 3 months after the last dose. A reliable form of contraception is recommended while taking this medication and for 3 months after the last dose. Talk to your care team about effective forms of contraception. Do not breast-feed while taking this medication. What side effects may I notice from receiving this medication? Side effects that you should report to your care team as soon as possible: Allergic reactions--skin rash, itching, hives, swelling of the face, lips, tongue, or throat Heart rhythm changes--fast or irregular heartbeat, dizziness, feeling faint or lightheaded, chest pain, trouble breathing Infection--fever, chills, cough, sore throat, wounds that don't heal, pain or trouble when passing urine, general feeling of discomfort or being unwell Infusion reactions--chest pain, shortness of breath or trouble breathing, feeling faint or lightheaded Sudden eye pain or change in vision such as blurry vision, seeing halos around lights, vision loss Unusual bruising or bleeding Side effects that usually do not require medical attention (report to your care team if they continue or are bothersome): Constipation Diarrhea Fatigue Nausea Pain, tingling, or numbness in the hands or feet Swelling of the ankles, hands, or feet This list may not describe all possible side effects. Call your doctor for medical advice about side effects. You may report side effects to FDA at 1-800-FDA-1088. Where should I keep my medication? This medication is given  in a hospital or clinic. It will not be stored at home. NOTE: This sheet is a summary. It may not cover all possible information. If you have questions about this medicine, talk to your doctor, pharmacist, or health care provider.  2024 Elsevier/Gold Standard (2022-04-21 00:00:00)    Filgrastim Injection What is this medication? FILGRASTIM (fil GRA stim) lowers the risk of infection in people who are receiving chemotherapy. It works by Systems analyst make more white blood cells, which protects your body from infection. It may also be used to help people who have been exposed to high doses of radiation. It can be used to help prepare your body before a stem cell transplant. It works by helping your bone marrow make and release stem cells into the blood. This medicine may be used for other purposes; ask your health care provider or pharmacist if you have questions. COMMON BRAND NAME(S): Neupogen, Nivestym, Releuko, Zarxio What should I tell my care team before I take this medication? They need to know if you have any of these conditions: History of blood diseases, such as sickle cell anemia Kidney disease Recent or ongoing radiation An unusual or allergic reaction to filgrastim, pegfilgrastim, latex, rubber, other medications, foods, dyes, or preservatives Pregnant or trying to get pregnant Breast-feeding How should I use this medication? This medication is injected under the skin or into a vein.  It is usually given by your care team in a hospital or clinic setting. It may be given at home. If you get this medication at home, you will be taught how to prepare and give it. Use exactly as directed. Take it as directed on the prescription label at the same time every day. Keep taking it unless your care team tells you to stop. It is important that you put your used needles and syringes in a special sharps container. Do not put them in a trash can. If you do not have a sharps container, call your  pharmacist or care team to get one. This medication comes with INSTRUCTIONS FOR USE. Ask your pharmacist for directions on how to use this medication. Read the information carefully. Talk to your pharmacist or care team if you have questions. Talk to your care team about the use of this medication in children. While it may be prescribed for children for selected conditions, precautions do apply. Overdosage: If you think you have taken too much of this medicine contact a poison control center or emergency room at once. NOTE: This medicine is only for you. Do not share this medicine with others. What if I miss a dose? It is important not to miss any doses. Talk to your care team about what to do if you miss a dose. What may interact with this medication? Medications that may cause a release of neutrophils, such as lithium This list may not describe all possible interactions. Give your health care provider a list of all the medicines, herbs, non-prescription drugs, or dietary supplements you use. Also tell them if you smoke, drink alcohol, or use illegal drugs. Some items may interact with your medicine. What should I watch for while using this medication? Your condition will be monitored carefully while you are receiving this medication. You may need bloodwork while taking this medication. Talk to your care team about your risk of cancer. You may be more at risk for certain types of cancer if you take this medication. What side effects may I notice from receiving this medication? Side effects that you should report to your care team as soon as possible: Allergic reactions--skin rash, itching, hives, swelling of the face, lips, tongue, or throat Capillary leak syndrome--stomach or muscle pain, unusual weakness or fatigue, feeling faint or lightheaded, decrease in the amount of urine, swelling of the ankles, hands, or feet, trouble breathing High white blood cell level--fever, fatigue, trouble  breathing, night sweats, change in vision, weight loss Inflammation of the aorta--fever, fatigue, back, chest, or stomach pain, severe headache Kidney injury (glomerulonephritis)--decrease in the amount of urine, red or dark Annaliza Zia urine, foamy or bubbly urine, swelling of the ankles, hands, or feet Shortness of breath or trouble breathing Spleen injury--pain in upper left stomach or shoulder Unusual bruising or bleeding Side effects that usually do not require medical attention (report to your care team if they continue or are bothersome): Back pain Bone pain Fatigue Fever Headache Nausea This list may not describe all possible side effects. Call your doctor for medical advice about side effects. You may report side effects to FDA at 1-800-FDA-1088. Where should I keep my medication? Keep out of the reach of children and pets. Keep this medication in the original packaging until you are ready to take it. Protect from light. See product for storage information. Each product may have different instructions. Get rid of any unused medication after the expiration date. To get rid of medications that are  no longer needed or have expired: Take the medication to a medications take-back program. Check with your pharmacy or law enforcement to find a location. If you cannot return the medication, ask your pharmacist or care team how to get rid of this medication safely. NOTE: This sheet is a summary. It may not cover all possible information. If you have questions about this medicine, talk to your doctor, pharmacist, or health care provider.  2024 Elsevier/Gold Standard (2022-05-07 00:00:00)        To help prevent nausea and vomiting after your treatment, we encourage you to take your nausea medication as directed.  BELOW ARE SYMPTOMS THAT SHOULD BE REPORTED IMMEDIATELY: *FEVER GREATER THAN 100.4 F (38 C) OR HIGHER *CHILLS OR SWEATING *NAUSEA AND VOMITING THAT IS NOT CONTROLLED WITH YOUR NAUSEA  MEDICATION *UNUSUAL SHORTNESS OF BREATH *UNUSUAL BRUISING OR BLEEDING *URINARY PROBLEMS (pain or burning when urinating, or frequent urination) *BOWEL PROBLEMS (unusual diarrhea, constipation, pain near the anus) TENDERNESS IN MOUTH AND THROAT WITH OR WITHOUT PRESENCE OF ULCERS (sore throat, sores in mouth, or a toothache) UNUSUAL RASH, SWELLING OR PAIN  UNUSUAL VAGINAL DISCHARGE OR ITCHING   Items with * indicate a potential emergency and should be followed up as soon as possible or go to the Emergency Department if any problems should occur.  Please show the CHEMOTHERAPY ALERT CARD or IMMUNOTHERAPY ALERT CARD at check-in to the Emergency Department and triage nurse.  Should you have questions after your visit or need to cancel or reschedule your appointment, please contact [facility] [phone]  and follow the prompts.  Office hours are 8:00 a.m. to 4:30 p.m. Monday - Friday. Please note that voicemails left after 4:00 p.m. may not be returned until the following business day.  We are closed weekends and major holidays. You have access to a nurse at all times for urgent questions. Please call the main number to the clinic [phone] and follow the prompts.  For any non-urgent questions, you may also contact your provider using MyChart. We now offer e-Visits for anyone 15 and older to request care online for non-urgent symptoms. For details visit [email or web address].   Also download the MyChart app! Go to the app store, search "MyChart", open the app, select [place], and log in with your MyChart username and password.

## 2023-06-22 NOTE — Progress Notes (Signed)
Patient is taking Pomalyst as prescribed. He has not missed any doses and reports no side effects at this time.    Patient has been examined by Dr. Ellin Saba. Vital signs and labs have been reviewed by MD - ANC (0.6), Creatinine, LFTs, hemoglobin, and platelets are within treatment parameters per M.D. - pt may proceed with treatment.  Give [facility] [place] along with tx per MD. Primary RN and pharmacy notified.

## 2023-06-22 NOTE — Patient Instructions (Signed)
[facility] Discharge Instructions   You were seen and examined today by Dr. Ellin Saba.  He reviewed the results of your lab work which are normal/stable. Your myeloma labs are normal/stable.   We will proceed with your treatment today.   Return as scheduled.    Thank you for choosing  Cancer Center at [facility] to provide your oncology and hematology care.  To afford each patient quality time with our provider, please arrive at least 15 minutes before your scheduled appointment time.   If you have a lab appointment with the Cancer Center please come in thru the Main Entrance and check in at the main information desk.  You need to re-schedule your appointment should you arrive 10 or more minutes late.  We strive to give you quality time with our providers, and arriving late affects you and other patients whose appointments are after yours.  Also, if you no show three or more times for appointments you may be dismissed from the clinic at the providers discretion.     Again, thank you for choosing [facility].  Our hope is that these requests will decrease the amount of time that you wait before being seen by our physicians.       _____________________________________________________________  Should you have questions after your visit to [facility], please contact our office at [phone] and follow the prompts.  Our office hours are 8:00 a.m. and 4:30 p.m. Monday - Friday.  Please note that voicemails left after 4:00 p.m. may not be returned until the following business day.  We are closed weekends and major holidays.  You do have access to a nurse 24-7, just call the main number to the clinic [phone] and do not press any options, hold on the line and a nurse will answer the phone.    For prescription refill requests, have your pharmacy contact our office and allow 72 hours.    Due to Covid, you  will need to wear a mask upon entering the hospital. If you do not have a mask, a mask will be given to you at the Main Entrance upon arrival. For doctor visits, patients may have 1 support person age 63 or older with them. For treatment visits, patients can not have anyone with them due to social distancing guidelines and our immunocompromised population.

## 2023-06-22 NOTE — Progress Notes (Signed)
Patient presents today for chemotherapy injection. Patient is in satisfactory condition with no new complaints voiced.  Vital signs are stable.  Labs reviewed by Dr. Ellin Saba during the office visit.  ANC today is 0.6.  All other labs are within treatment parameters.  Patient will receive Releuko 480 mcg injection today per MD. All pre-medications were taken at 0900 at home prior to visit.  We will proceed with treatment per MD orders.   Patient tolerated treatment and injection well with no complaints voiced.  Patient left ambulatory with wife in stable condition.  Vital signs stable at discharge.  Follow up as scheduled.

## 2023-06-23 ENCOUNTER — Other Ambulatory Visit: Payer: Self-pay

## 2023-06-23 ENCOUNTER — Inpatient Hospital Stay: Payer: Medicare Other

## 2023-06-23 VITALS — BP 113/62 | HR 61 | Temp 98.0°F | Resp 16

## 2023-06-23 DIAGNOSIS — K219 Gastro-esophageal reflux disease without esophagitis: Secondary | ICD-10-CM | POA: Diagnosis not present

## 2023-06-23 DIAGNOSIS — C7951 Secondary malignant neoplasm of bone: Secondary | ICD-10-CM

## 2023-06-23 DIAGNOSIS — Z79810 Long term (current) use of selective estrogen receptor modulators (SERMs): Secondary | ICD-10-CM | POA: Diagnosis not present

## 2023-06-23 DIAGNOSIS — N189 Chronic kidney disease, unspecified: Secondary | ICD-10-CM | POA: Diagnosis not present

## 2023-06-23 DIAGNOSIS — D702 Other drug-induced agranulocytosis: Secondary | ICD-10-CM

## 2023-06-23 DIAGNOSIS — D801 Nonfamilial hypogammaglobulinemia: Secondary | ICD-10-CM | POA: Diagnosis not present

## 2023-06-23 DIAGNOSIS — C9000 Multiple myeloma not having achieved remission: Secondary | ICD-10-CM

## 2023-06-23 DIAGNOSIS — C9002 Multiple myeloma in relapse: Secondary | ICD-10-CM | POA: Diagnosis not present

## 2023-06-23 DIAGNOSIS — Z5112 Encounter for antineoplastic immunotherapy: Secondary | ICD-10-CM | POA: Diagnosis not present

## 2023-06-23 DIAGNOSIS — N179 Acute kidney failure, unspecified: Secondary | ICD-10-CM

## 2023-06-23 MED ORDER — EPOETIN ALFA-EPBX 20000 UNIT/ML IJ SOLN
20000.0000 [IU] | Freq: Once | INTRAMUSCULAR | Status: AC
  Administered 2023-06-23: 20000 [IU] via SUBCUTANEOUS
  Filled 2023-06-23: qty 1

## 2023-06-23 MED ORDER — DENOSUMAB 120 MG/1.7ML ~~LOC~~ SOLN
120.0000 mg | Freq: Once | SUBCUTANEOUS | Status: AC
  Administered 2023-06-23: 120 mg via SUBCUTANEOUS
  Filled 2023-06-23: qty 1.7

## 2023-06-23 NOTE — Patient Instructions (Signed)
[facility]  Discharge Instructions: Thank you for choosing [facility] to provide your oncology and hematology care.  If you have a lab appointment with the Cancer Center - please note that after April 8th, 2024, all labs will be drawn in the cancer center.  You do not have to check in or register with the main entrance as you have in the past but will complete your check-in in the cancer center.  Wear comfortable clothing and clothing appropriate for easy access to any Portacath or PICC line.   We strive to give you quality time with your provider. You may need to reschedule your appointment if you arrive late (15 or more minutes).  Arriving late affects you and other patients whose appointments are after yours.  Also, if you miss three or more appointments without notifying the office, you may be dismissed from the clinic at the provider's discretion.      For prescription refill requests, have your pharmacy contact our office and allow 72 hours for refills to be completed.    Today you received the following Xgeva and Retacrit, return as scheduled.   To help prevent nausea and vomiting after your treatment, we encourage you to take your nausea medication as directed.  BELOW ARE SYMPTOMS THAT SHOULD BE REPORTED IMMEDIATELY: *FEVER GREATER THAN 100.4 F (38 C) OR HIGHER *CHILLS OR SWEATING *NAUSEA AND VOMITING THAT IS NOT CONTROLLED WITH YOUR NAUSEA MEDICATION *UNUSUAL SHORTNESS OF BREATH *UNUSUAL BRUISING OR BLEEDING *URINARY PROBLEMS (pain or burning when urinating, or frequent urination) *BOWEL PROBLEMS (unusual diarrhea, constipation, pain near the anus) TENDERNESS IN MOUTH AND THROAT WITH OR WITHOUT PRESENCE OF ULCERS (sore throat, sores in mouth, or a toothache) UNUSUAL RASH, SWELLING OR PAIN  UNUSUAL VAGINAL DISCHARGE OR ITCHING   Items with * indicate a potential emergency and should be followed up as soon as possible or go to the Emergency  Department if any problems should occur.  Please show the CHEMOTHERAPY ALERT CARD or IMMUNOTHERAPY ALERT CARD at check-in to the Emergency Department and triage nurse.  Should you have questions after your visit or need to cancel or reschedule your appointment, please contact [facility] AT [facility] [phone]  and follow the prompts.  Office hours are 8:00 a.m. to 4:30 p.m. Monday - Friday. Please note that voicemails left after 4:00 p.m. may not be returned until the following business day.  We are closed weekends and major holidays. You have access to a nurse at all times for urgent questions. Please call the main number to the clinic [phone] and follow the prompts.  For any non-urgent questions, you may also contact your provider using MyChart. We now offer e-Visits for anyone 9 and older to request care online for non-urgent symptoms. For details visit [email or web address].   Also download the MyChart app! Go to the app store, search "MyChart", open the app, select [place], and log in with your MyChart username and password.

## 2023-06-23 NOTE — Progress Notes (Signed)
Patient presents today for Retacrit injection. Hemoglobin reviewed prior to administration. VSS tolerated without incident or complaint. See MAR for details. Patient stable during and after injection.  °Patient taking calcium as directed. Denied tooth, jaw, and leg pain. No recent or upcoming dental visits. Labs reviewed. Patient tolerated injection with no complaints voiced. See MAR for details. Patient stable during and after injection. Site clean and dry with no bruising or swelling noted. Band aid applied. Vss with discharge and left in satisfactory condition with no s/s of distress.   °

## 2023-06-30 ENCOUNTER — Other Ambulatory Visit: Payer: Self-pay

## 2023-06-30 DIAGNOSIS — I10 Essential (primary) hypertension: Secondary | ICD-10-CM

## 2023-06-30 DIAGNOSIS — G4700 Insomnia, unspecified: Secondary | ICD-10-CM

## 2023-06-30 MED ORDER — POMALIDOMIDE 2 MG PO CAPS
ORAL_CAPSULE | ORAL | 0 refills | Status: DC
Start: 2023-06-30 — End: 2023-07-27

## 2023-06-30 NOTE — Telephone Encounter (Signed)
Chart reviewed. Pomalyst refilled per last office note with Dr. Katragadda.  

## 2023-07-12 DIAGNOSIS — Z9889 Other specified postprocedural states: Secondary | ICD-10-CM | POA: Diagnosis not present

## 2023-07-12 DIAGNOSIS — C9000 Multiple myeloma not having achieved remission: Secondary | ICD-10-CM | POA: Diagnosis not present

## 2023-07-12 DIAGNOSIS — J324 Chronic pansinusitis: Secondary | ICD-10-CM | POA: Diagnosis not present

## 2023-07-21 ENCOUNTER — Inpatient Hospital Stay: Payer: Medicare Other | Attending: Hematology

## 2023-07-21 ENCOUNTER — Inpatient Hospital Stay: Payer: Medicare Other | Admitting: Hematology

## 2023-07-21 ENCOUNTER — Inpatient Hospital Stay: Payer: Medicare Other

## 2023-07-21 DIAGNOSIS — D631 Anemia in chronic kidney disease: Secondary | ICD-10-CM | POA: Diagnosis not present

## 2023-07-21 DIAGNOSIS — C9002 Multiple myeloma in relapse: Secondary | ICD-10-CM | POA: Insufficient documentation

## 2023-07-21 DIAGNOSIS — Z5112 Encounter for antineoplastic immunotherapy: Secondary | ICD-10-CM | POA: Diagnosis not present

## 2023-07-21 DIAGNOSIS — Z87891 Personal history of nicotine dependence: Secondary | ICD-10-CM | POA: Diagnosis not present

## 2023-07-21 DIAGNOSIS — N189 Chronic kidney disease, unspecified: Secondary | ICD-10-CM | POA: Insufficient documentation

## 2023-07-21 DIAGNOSIS — D509 Iron deficiency anemia, unspecified: Secondary | ICD-10-CM | POA: Diagnosis not present

## 2023-07-21 DIAGNOSIS — C9000 Multiple myeloma not having achieved remission: Secondary | ICD-10-CM

## 2023-07-21 LAB — CBC WITH DIFFERENTIAL/PLATELET
Abs Immature Granulocytes: 0.01 10*3/uL (ref 0.00–0.07)
Basophils Absolute: 0.1 10*3/uL (ref 0.0–0.1)
Basophils Relative: 2 %
Eosinophils Absolute: 0.3 10*3/uL (ref 0.0–0.5)
Eosinophils Relative: 7 %
HCT: 31.2 % — ABNORMAL LOW (ref 39.0–52.0)
Hemoglobin: 10.3 g/dL — ABNORMAL LOW (ref 13.0–17.0)
Immature Granulocytes: 0 %
Lymphocytes Relative: 23 %
Lymphs Abs: 0.9 10*3/uL (ref 0.7–4.0)
MCH: 35.2 pg — ABNORMAL HIGH (ref 26.0–34.0)
MCHC: 33 g/dL (ref 30.0–36.0)
MCV: 106.5 fL — ABNORMAL HIGH (ref 80.0–100.0)
Monocytes Absolute: 0.8 10*3/uL (ref 0.1–1.0)
Monocytes Relative: 20 %
Neutro Abs: 1.9 10*3/uL (ref 1.7–7.7)
Neutrophils Relative %: 48 %
Platelets: 185 10*3/uL (ref 150–400)
RBC: 2.93 MIL/uL — ABNORMAL LOW (ref 4.22–5.81)
RDW: 17.7 % — ABNORMAL HIGH (ref 11.5–15.5)
WBC: 3.9 10*3/uL — ABNORMAL LOW (ref 4.0–10.5)
nRBC: 0 % (ref 0.0–0.2)

## 2023-07-21 LAB — COMPREHENSIVE METABOLIC PANEL
ALT: 13 U/L (ref 0–44)
AST: 18 U/L (ref 15–41)
Albumin: 3.3 g/dL — ABNORMAL LOW (ref 3.5–5.0)
Alkaline Phosphatase: 31 U/L — ABNORMAL LOW (ref 38–126)
Anion gap: 5 (ref 5–15)
BUN: 12 mg/dL (ref 8–23)
CO2: 23 mmol/L (ref 22–32)
Calcium: 8.5 mg/dL — ABNORMAL LOW (ref 8.9–10.3)
Chloride: 109 mmol/L (ref 98–111)
Creatinine, Ser: 1.33 mg/dL — ABNORMAL HIGH (ref 0.61–1.24)
GFR, Estimated: 56 mL/min — ABNORMAL LOW (ref >60)
Glucose, Bld: 95 mg/dL (ref 70–99)
Potassium: 4.3 mmol/L (ref 3.5–5.1)
Sodium: 137 mmol/L (ref 135–145)
Total Bilirubin: 0.2 mg/dL — ABNORMAL LOW (ref 0.3–1.2)
Total Protein: 5.4 g/dL — ABNORMAL LOW (ref 6.5–8.1)

## 2023-07-21 MED ORDER — HEPARIN SOD (PORK) LOCK FLUSH 100 UNIT/ML IV SOLN
500.0000 [IU] | Freq: Once | INTRAVENOUS | Status: AC
  Administered 2023-07-21: 500 [IU] via INTRAVENOUS

## 2023-07-21 MED ORDER — SODIUM CHLORIDE 0.9% FLUSH
10.0000 mL | Freq: Once | INTRAVENOUS | Status: AC
  Administered 2023-07-21: 10 mL via INTRAVENOUS

## 2023-07-21 MED ORDER — DARATUMUMAB-HYALURONIDASE-FIHJ 1800-30000 MG-UT/15ML ~~LOC~~ SOLN
1800.0000 mg | Freq: Once | SUBCUTANEOUS | Status: AC
  Administered 2023-07-21: 1800 mg via SUBCUTANEOUS
  Filled 2023-07-21: qty 15

## 2023-07-21 NOTE — Patient Instructions (Signed)

## 2023-07-21 NOTE — Progress Notes (Signed)
Patient took Pre meds at home for treatment.   Patient tolerated Daratumumab injection with no complaints voiced.  See MAR for details.  Labs reviewed. Injection site clean and dry with no bruising or swelling noted at site.  Band aid applied.  Vss with discharge and left in satisfactory condition with no s/s of distress noted.

## 2023-07-21 NOTE — Progress Notes (Signed)
Patients port flushed without difficulty.  Good blood return noted with no bruising or swelling noted at site.  Band aid applied.  VSS with discharge and left in satisfactory condition with no s/s of distress noted.   

## 2023-07-22 ENCOUNTER — Inpatient Hospital Stay: Payer: Medicare Other

## 2023-07-22 VITALS — BP 142/75 | HR 66 | Temp 98.4°F | Resp 17

## 2023-07-22 DIAGNOSIS — Z5112 Encounter for antineoplastic immunotherapy: Secondary | ICD-10-CM | POA: Diagnosis not present

## 2023-07-22 DIAGNOSIS — N189 Chronic kidney disease, unspecified: Secondary | ICD-10-CM | POA: Diagnosis not present

## 2023-07-22 DIAGNOSIS — C9002 Multiple myeloma in relapse: Secondary | ICD-10-CM | POA: Diagnosis not present

## 2023-07-22 DIAGNOSIS — C9000 Multiple myeloma not having achieved remission: Secondary | ICD-10-CM

## 2023-07-22 DIAGNOSIS — D509 Iron deficiency anemia, unspecified: Secondary | ICD-10-CM | POA: Diagnosis not present

## 2023-07-22 DIAGNOSIS — Z87891 Personal history of nicotine dependence: Secondary | ICD-10-CM | POA: Diagnosis not present

## 2023-07-22 DIAGNOSIS — N179 Acute kidney failure, unspecified: Secondary | ICD-10-CM

## 2023-07-22 DIAGNOSIS — C7951 Secondary malignant neoplasm of bone: Secondary | ICD-10-CM

## 2023-07-22 DIAGNOSIS — D631 Anemia in chronic kidney disease: Secondary | ICD-10-CM | POA: Diagnosis not present

## 2023-07-22 MED ORDER — EPOETIN ALFA-EPBX 10000 UNIT/ML IJ SOLN: 20000.0000 [IU] | Freq: Once | INTRAMUSCULAR | Status: DC

## 2023-07-22 MED ORDER — DENOSUMAB 120 MG/1.7ML ~~LOC~~ SOLN
120.0000 mg | Freq: Once | SUBCUTANEOUS | Status: AC
  Administered 2023-07-22: 120 mg via SUBCUTANEOUS
  Filled 2023-07-22: qty 1.7

## 2023-07-22 NOTE — Progress Notes (Signed)
Johnny Lucas presents today for injection per the provider's orders.  XGeva administration without incident; injection site WNL; see MAR for injection details.  Patient tolerated procedure well and without incident.  No questions or complaints noted at this time. Discharged from clinic ambulatory in stable condition. Alert and oriented x 3. F/U with [name] as scheduled.

## 2023-07-22 NOTE — Patient Instructions (Signed)
[facility]  Discharge Instructions: Thank you for choosing [facility] to provide your oncology and hematology care.  If you have a lab appointment with the Cancer Center - please note that after April 8th, 2024, all labs will be drawn in the cancer center.  You do not have to check in or register with the main entrance as you have in the past but will complete your check-in in the cancer center.  Wear comfortable clothing and clothing appropriate for easy access to any Portacath or PICC line.   We strive to give you quality time with your provider. You may need to reschedule your appointment if you arrive late (15 or more minutes).  Arriving late affects you and other patients whose appointments are after yours.  Also, if you miss three or more appointments without notifying the office, you may be dismissed from the clinic at the provider's discretion.      For prescription refill requests, have your pharmacy contact our office and allow 72 hours for refills to be completed.    Today you received the following chemotherapy and/or immunotherapy agents Xgeva.  Denosumab Injection (Oncology) What is this medication? DENOSUMAB (den oh SUE mab) prevents weakened bones caused by cancer. It may also be used to treat noncancerous bone tumors that cannot be removed by surgery. It can also be used to treat high calcium levels in the blood caused by cancer. It works by blocking a protein that causes bones to break down quickly. This slows down the release of calcium from bones, which lowers calcium levels in your blood. It also makes your bones stronger and less likely to break (fracture). This medicine may be used for other purposes; ask your health care provider or pharmacist if you have questions. COMMON BRAND NAME(S): XGEVA What should I tell my care team before I take this medication? They need to know if you have any of these conditions: Dental disease Having  surgery or tooth extraction Infection Kidney disease Low levels of calcium or vitamin D in the blood Malnutrition On hemodialysis Skin conditions or sensitivity Thyroid or parathyroid disease An unusual reaction to denosumab, other medications, foods, dyes, or preservatives Pregnant or trying to get pregnant Breast-feeding How should I use this medication? This medication is for injection under the skin. It is given by your care team in a hospital or clinic setting. A special MedGuide will be given to you before each treatment. Be sure to read this information carefully each time. Talk to your care team about the use of this medication in children. While it may be prescribed for children as young as 13 years for selected conditions, precautions do apply. Overdosage: If you think you have taken too much of this medicine contact a poison control center or emergency room at once. NOTE: This medicine is only for you. Do not share this medicine with others. What if I miss a dose? Keep appointments for follow-up doses. It is important not to miss your dose. Call your care team if you are unable to keep an appointment. What may interact with this medication? Do not take this medication with any of the following: Other medications containing denosumab This medication may also interact with the following: Medications that lower your chance of fighting infection Steroid medications, such as prednisone or cortisone This list may not describe all possible interactions. Give your health care provider a list of all the medicines, herbs, non-prescription drugs, or dietary supplements you use.  Also tell them if you smoke, drink alcohol, or use illegal drugs. Some items may interact with your medicine. What should I watch for while using this medication? Your condition will be monitored carefully while you are receiving this medication. You may need blood work while taking this medication. This medication  may increase your risk of getting an infection. Call your care team for advice if you get a fever, chills, sore throat, or other symptoms of a cold or flu. Do not treat yourself. Try to avoid being around people who are sick. You should make sure you get enough calcium and vitamin D while you are taking this medication, unless your care team tells you not to. Discuss the foods you eat and the vitamins you take with your care team. Some people who take this medication have severe bone, joint, or muscle pain. This medication may also increase your risk for jaw problems or a broken thigh bone. Tell your care team right away if you have severe pain in your jaw, bones, joints, or muscles. Tell your care team if you have any pain that does not go away or that gets worse. Talk to your care team if you may be pregnant. Serious birth defects can occur if you take this medication during pregnancy and for 5 months after the last dose. You will need a negative pregnancy test before starting this medication. Contraception is recommended while taking this medication and for 5 months after the last dose. Your care team can help you find the option that works for you. What side effects may I notice from receiving this medication? Side effects that you should report to your care team as soon as possible: Allergic reactions--skin rash, itching, hives, swelling of the face, lips, tongue, or throat Bone, joint, or muscle pain Low calcium level--muscle pain or cramps, confusion, tingling, or numbness in the hands or feet Osteonecrosis of the jaw--pain, swelling, or redness in the mouth, numbness of the jaw, poor healing after dental work, unusual discharge from the mouth, visible bones in the mouth Side effects that usually do not require medical attention (report to your care team if they continue or are bothersome): Cough Diarrhea Fatigue Headache Nausea This list may not describe all possible side effects. Call your  doctor for medical advice about side effects. You may report side effects to FDA at 1-800-FDA-1088. Where should I keep my medication? This medication is given in a hospital or clinic. It will not be stored at home. NOTE: This sheet is a summary. It may not cover all possible information. If you have questions about this medicine, talk to your doctor, pharmacist, or health care provider.  2024 Elsevier/Gold Standard (2022-05-06 00:00:00)    To help prevent nausea and vomiting after your treatment, we encourage you to take your nausea medication as directed.  BELOW ARE SYMPTOMS THAT SHOULD BE REPORTED IMMEDIATELY: *FEVER GREATER THAN 100.4 F (38 C) OR HIGHER *CHILLS OR SWEATING *NAUSEA AND VOMITING THAT IS NOT CONTROLLED WITH YOUR NAUSEA MEDICATION *UNUSUAL SHORTNESS OF BREATH *UNUSUAL BRUISING OR BLEEDING *URINARY PROBLEMS (pain or burning when urinating, or frequent urination) *BOWEL PROBLEMS (unusual diarrhea, constipation, pain near the anus) TENDERNESS IN MOUTH AND THROAT WITH OR WITHOUT PRESENCE OF ULCERS (sore throat, sores in mouth, or a toothache) UNUSUAL RASH, SWELLING OR PAIN  UNUSUAL VAGINAL DISCHARGE OR ITCHING   Items with * indicate a potential emergency and should be followed up as soon as possible or go to the Emergency Department if any  problems should occur.  Please show the CHEMOTHERAPY ALERT CARD or IMMUNOTHERAPY ALERT CARD at check-in to the Emergency Department and triage nurse.  Should you have questions after your visit or need to cancel or reschedule your appointment, please contact [facility] [phone]  and follow the prompts.  Office hours are 8:00 a.m. to 4:30 p.m. Monday - Friday. Please note that voicemails left after 4:00 p.m. may not be returned until the following business day.  We are closed weekends and major holidays. You have access to a nurse at all times for urgent questions. Please call the main number to the clinic  [phone] and follow the prompts.  For any non-urgent questions, you may also contact your provider using MyChart. We now offer e-Visits for anyone 67 and older to request care online for non-urgent symptoms. For details visit [email or web address].   Also download the MyChart app! Go to the app store, search "MyChart", open the app, select [place], and log in with your MyChart username and password.

## 2023-07-27 ENCOUNTER — Other Ambulatory Visit: Payer: Self-pay

## 2023-07-27 DIAGNOSIS — I10 Essential (primary) hypertension: Secondary | ICD-10-CM

## 2023-07-27 DIAGNOSIS — G4700 Insomnia, unspecified: Secondary | ICD-10-CM

## 2023-07-27 MED ORDER — POMALIDOMIDE 2 MG PO CAPS
ORAL_CAPSULE | ORAL | 0 refills | Status: DC
Start: 2023-07-27 — End: 2023-08-23

## 2023-07-27 NOTE — Telephone Encounter (Signed)
Chart reviewed. Pomalyst refilled per last office note with Dr. Katragadda.  

## 2023-08-04 ENCOUNTER — Other Ambulatory Visit: Payer: Self-pay

## 2023-08-11 ENCOUNTER — Inpatient Hospital Stay: Payer: Medicare Other | Attending: Hematology

## 2023-08-11 DIAGNOSIS — Z801 Family history of malignant neoplasm of trachea, bronchus and lung: Secondary | ICD-10-CM | POA: Insufficient documentation

## 2023-08-11 DIAGNOSIS — G629 Polyneuropathy, unspecified: Secondary | ICD-10-CM | POA: Diagnosis not present

## 2023-08-11 DIAGNOSIS — N189 Chronic kidney disease, unspecified: Secondary | ICD-10-CM | POA: Insufficient documentation

## 2023-08-11 DIAGNOSIS — C7951 Secondary malignant neoplasm of bone: Secondary | ICD-10-CM | POA: Insufficient documentation

## 2023-08-11 DIAGNOSIS — Z7951 Long term (current) use of inhaled steroids: Secondary | ICD-10-CM | POA: Diagnosis not present

## 2023-08-11 DIAGNOSIS — D801 Nonfamilial hypogammaglobulinemia: Secondary | ICD-10-CM | POA: Insufficient documentation

## 2023-08-11 DIAGNOSIS — Z87891 Personal history of nicotine dependence: Secondary | ICD-10-CM | POA: Insufficient documentation

## 2023-08-11 DIAGNOSIS — Z5112 Encounter for antineoplastic immunotherapy: Secondary | ICD-10-CM | POA: Diagnosis not present

## 2023-08-11 DIAGNOSIS — D509 Iron deficiency anemia, unspecified: Secondary | ICD-10-CM | POA: Insufficient documentation

## 2023-08-11 DIAGNOSIS — Z809 Family history of malignant neoplasm, unspecified: Secondary | ICD-10-CM | POA: Diagnosis not present

## 2023-08-11 DIAGNOSIS — Z7961 Long term (current) use of immunomodulator: Secondary | ICD-10-CM | POA: Insufficient documentation

## 2023-08-11 DIAGNOSIS — Z79899 Other long term (current) drug therapy: Secondary | ICD-10-CM | POA: Diagnosis not present

## 2023-08-11 DIAGNOSIS — Z7982 Long term (current) use of aspirin: Secondary | ICD-10-CM | POA: Diagnosis not present

## 2023-08-11 DIAGNOSIS — I129 Hypertensive chronic kidney disease with stage 1 through stage 4 chronic kidney disease, or unspecified chronic kidney disease: Secondary | ICD-10-CM | POA: Diagnosis not present

## 2023-08-11 DIAGNOSIS — Z853 Personal history of malignant neoplasm of breast: Secondary | ICD-10-CM | POA: Diagnosis not present

## 2023-08-11 DIAGNOSIS — Z9221 Personal history of antineoplastic chemotherapy: Secondary | ICD-10-CM | POA: Diagnosis not present

## 2023-08-11 DIAGNOSIS — Z79810 Long term (current) use of selective estrogen receptor modulators (SERMs): Secondary | ICD-10-CM | POA: Insufficient documentation

## 2023-08-11 DIAGNOSIS — Z923 Personal history of irradiation: Secondary | ICD-10-CM | POA: Diagnosis not present

## 2023-08-11 DIAGNOSIS — C9002 Multiple myeloma in relapse: Secondary | ICD-10-CM | POA: Insufficient documentation

## 2023-08-11 DIAGNOSIS — C50922 Malignant neoplasm of unspecified site of left male breast: Secondary | ICD-10-CM | POA: Insufficient documentation

## 2023-08-11 DIAGNOSIS — K219 Gastro-esophageal reflux disease without esophagitis: Secondary | ICD-10-CM | POA: Diagnosis not present

## 2023-08-11 DIAGNOSIS — Z9484 Stem cells transplant status: Secondary | ICD-10-CM | POA: Diagnosis not present

## 2023-08-11 DIAGNOSIS — D631 Anemia in chronic kidney disease: Secondary | ICD-10-CM | POA: Diagnosis not present

## 2023-08-11 DIAGNOSIS — C9000 Multiple myeloma not having achieved remission: Secondary | ICD-10-CM

## 2023-08-11 LAB — CBC WITH DIFFERENTIAL/PLATELET
Abs Immature Granulocytes: 0 10*3/uL (ref 0.00–0.07)
Basophils Absolute: 0.1 10*3/uL (ref 0.0–0.1)
Basophils Relative: 2 %
Eosinophils Absolute: 0.4 10*3/uL (ref 0.0–0.5)
Eosinophils Relative: 15 %
HCT: 31 % — ABNORMAL LOW (ref 39.0–52.0)
Hemoglobin: 10 g/dL — ABNORMAL LOW (ref 13.0–17.0)
Immature Granulocytes: 0 %
Lymphocytes Relative: 37 %
Lymphs Abs: 1.1 10*3/uL (ref 0.7–4.0)
MCH: 34.6 pg — ABNORMAL HIGH (ref 26.0–34.0)
MCHC: 32.3 g/dL (ref 30.0–36.0)
MCV: 107.3 fL — ABNORMAL HIGH (ref 80.0–100.0)
Monocytes Absolute: 0.7 10*3/uL (ref 0.1–1.0)
Monocytes Relative: 25 %
Neutro Abs: 0.6 10*3/uL — ABNORMAL LOW (ref 1.7–7.7)
Neutrophils Relative %: 21 %
Platelets: 126 10*3/uL — ABNORMAL LOW (ref 150–400)
RBC: 2.89 MIL/uL — ABNORMAL LOW (ref 4.22–5.81)
RDW: 16.4 % — ABNORMAL HIGH (ref 11.5–15.5)
WBC: 2.8 10*3/uL — ABNORMAL LOW (ref 4.0–10.5)
nRBC: 0 % (ref 0.0–0.2)

## 2023-08-11 LAB — COMPREHENSIVE METABOLIC PANEL
ALT: 12 U/L (ref 0–44)
AST: 12 U/L — ABNORMAL LOW (ref 15–41)
Albumin: 3.1 g/dL — ABNORMAL LOW (ref 3.5–5.0)
Alkaline Phosphatase: 31 U/L — ABNORMAL LOW (ref 38–126)
Anion gap: 4 — ABNORMAL LOW (ref 5–15)
BUN: 15 mg/dL (ref 8–23)
CO2: 24 mmol/L (ref 22–32)
Calcium: 7.7 mg/dL — ABNORMAL LOW (ref 8.9–10.3)
Chloride: 107 mmol/L (ref 98–111)
Creatinine, Ser: 1.27 mg/dL — ABNORMAL HIGH (ref 0.61–1.24)
GFR, Estimated: 59 mL/min — ABNORMAL LOW (ref >60)
Glucose, Bld: 95 mg/dL (ref 70–99)
Potassium: 4.1 mmol/L (ref 3.5–5.1)
Sodium: 135 mmol/L (ref 135–145)
Total Bilirubin: 0.4 mg/dL (ref 0.3–1.2)
Total Protein: 5.2 g/dL — ABNORMAL LOW (ref 6.5–8.1)

## 2023-08-11 MED ORDER — HEPARIN SOD (PORK) LOCK FLUSH 100 UNIT/ML IV SOLN
500.0000 [IU] | Freq: Once | INTRAVENOUS | Status: AC
  Administered 2023-08-11: 500 [IU] via INTRAVENOUS

## 2023-08-11 MED ORDER — SODIUM CHLORIDE 0.9% FLUSH
10.0000 mL | Freq: Once | INTRAVENOUS | Status: AC
  Administered 2023-08-11: 10 mL via INTRAVENOUS

## 2023-08-11 NOTE — Patient Instructions (Signed)

## 2023-08-11 NOTE — Progress Notes (Signed)
Patients port flushed without difficulty.  Good blood return noted with no bruising or swelling noted at site.  Band aid applied.  VSS with discharge and left in satisfactory condition with no s/s of distress noted.   

## 2023-08-12 LAB — KAPPA/LAMBDA LIGHT CHAINS
Kappa free light chain: 1.9 mg/L — ABNORMAL LOW (ref 3.3–19.4)
Kappa, lambda light chain ratio: 0.9 (ref 0.26–1.65)
Lambda free light chains: 2.1 mg/L — ABNORMAL LOW (ref 5.7–26.3)

## 2023-08-13 LAB — PROTEIN ELECTROPHORESIS, SERUM
A/G Ratio: 1.9 — ABNORMAL HIGH (ref 0.7–1.7)
Albumin ELP: 3.1 g/dL (ref 2.9–4.4)
Alpha-1-Globulin: 0.3 g/dL (ref 0.0–0.4)
Alpha-2-Globulin: 0.6 g/dL (ref 0.4–1.0)
Beta Globulin: 0.7 g/dL (ref 0.7–1.3)
Gamma Globulin: 0.1 g/dL — ABNORMAL LOW (ref 0.4–1.8)
Globulin, Total: 1.6 g/dL — ABNORMAL LOW (ref 2.2–3.9)
M-Spike, %: 0.1 g/dL — ABNORMAL HIGH
Total Protein ELP: 4.7 g/dL — ABNORMAL LOW (ref 6.0–8.5)

## 2023-08-15 NOTE — Progress Notes (Signed)
[facility] [address], [place] [address]    Clinic Day:  08/16/2023  Referring physician: Doreatha Massed, MD  Patient Care Team: Doreatha Massed, MD as PCP - General (Hematology) Doreatha Massed, MD as Consulting Physician (Oncology)   ASSESSMENT & PLAN:   Assessment: 1.  Relapsed multiple myeloma: -Autologous stem cell transplant on 10/08/2017 -Post transplant consolidation with CyCarD for 2 cycles with M spike undetectable. -Maintenance therapy with carfilzomib 70 mg per metered square days 1 and 15 every 28 days from 04/20/2018, dose reduced to 35 mg per metered square on 01/05/2019, titrated up to 56 mg per metered square on 01/09/2020. 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 -PET scan on 02/18/2020 showed new left supraclavicular lymph node at the thoracic inlet, SUV 14.2.  Multifocal osseous lesions largely improved, though residual lesions noted including right acromion, left acromion, thyroid cartilage, right sternum.  Additional lesions throughout the sternum, bilateral ribs, thoracolumbar spine, bilateral pelvis have resolved. -BMBX on 02/14/2020 shows plasma cell myeloma, 60-70% of cells.  FISH for myeloma was negative.  Cytogenetics was normal. -4 cycles of daratumumab, bortezomib and dexamethasone from 02/28/2020 through 04/29/2020. -Daratumumab, pomalidomide and dexamethasone started on 05/28/2020.  He is receiving daratumumab every 2 weeks, Pomalyst 3 mg days 1-21, dexamethasone 20 mg weekly. -Pomalidomide dose reduced to 2 mg days 1-21 around the first week of July 2021, due to severe weakness.    Plan: 1.  Relapsed IgG kappa multiple myeloma: - He is tolerating pomalidomide, Darzalex and dexamethasone reasonably well. - Reviewed myeloma labs from 08/11/2023: M spike stable at 0.1 g.  FLC ratio is 0.9.  Immunofixation is pending. - He has neutropenia most likely from pomalidomide.  He is  receiving intermittent G-CSF.  Latest ANC is low at 600.  Will proceed with Darzalex today.  He will receive G-CSF 480 mcg.  He is planning to go to [facility] on 09/14/2023.  Will likely give him Retacrit prior to his next visit to increase hemoglobin as he will do a lot of walking there. - Proceed with Darzalex today.  He receives dexamethasone 20 mg on days of Darzalex.  RTC 8 weeks for follow-up with repeat myeloma labs.  Continue pomalidomide 2 mg 3 weeks on/1 week off.   2.  Myeloma bone disease: - He is receiving monthly denosumab.  He is seeing dentist in the next 1 to 2 weeks for pain.  Will hold his denosumab for the next 2 months until next visit.   3.  Macrocytic anemia: - Anemia from CKD, relative iron deficiency and myelosuppression. - Monoferric on 05/21/2023.  Hemoglobin is 10.0 today. - Continue Retacrit as needed.   4.  ID prophylaxis: - Continue acyclovir twice daily.   5.  Peripheral neuropathy: - Continue gabapentin 600 mg 3 times daily.   6.  History of breast cancer: - Continue tamoxifen daily.  7.  Hypogammaglobulinemia: - IgG levels are severely low.  However he does not have any recurrent infections.  Will consider IVIG if needed.    Orders Placed This Encounter  Procedures   CBC with Differential/Platelet    Standing Status:   Future    Standing Expiration Date:   08/15/2024    Order Specific Question:   Release to patient    Answer:   Immediate   Comprehensive metabolic panel    Standing Status:   Future    Standing Expiration Date:   08/15/2024    Order Specific  Question:   Release to patient    Answer:   Immediate   Protein electrophoresis, serum    Standing Status:   Future    Standing Expiration Date:   08/15/2024    Order Specific Question:   Release to patient    Answer:   Immediate   Immunofixation electrophoresis    Standing Status:   Future    Standing Expiration Date:   08/15/2024    Order Specific Question:   Release to patient    Answer:    Immediate   Kappa/lambda light chains    Standing Status:   Future    Standing Expiration Date:   08/15/2024       Alben Deeds Teague,acting as a scribe for Doreatha Massed, MD.,have documented all relevant documentation on the behalf of Doreatha Massed, MD,as directed by  Doreatha Massed, MD while in the presence of Doreatha Massed, MD.  I, Doreatha Massed MD, have reviewed the above documentation for accuracy and completeness, and I agree with the above.    Doreatha Massed, MD   8/19/20244:10 PM  CHIEF COMPLAINT:   Diagnosis: multiple myeloma    Cancer Staging  Multiple myeloma ([name] Staging form: Plasma Cell Myeloma and Plasma Cell Disorders, AJCC 8th Edition - Clinical stage from 05/03/2017: Beta-2-microglobulin (mg/L): 3.5, Albumin (g/dL): 3.4, ISS: Stage II, High-risk cytogenetics: Absent, LDH: Not assessed - Signed by Ellouise Newer, PA-C on 05/03/2017    Prior Therapy: 1. RVD x 6 cycles from 03/01/2017 to 06/29/2017. 2. Stem cell transplant on 09/30/2017. 3. Carfilzomib and cyclophosphamide x 2 cycles from 02/14/2018 to 03/29/2018. 4. Carfilzomib x 22 cycles from 04/20/2018 to 01/23/2020.  Current Therapy:  Darzalex Faspro monthly; Pomalyst 2 mg 3/4 weeks; Xgeva monthly    HISTORY OF PRESENT ILLNESS:   Oncology History  Breast cancer, male ([name]  12/31/2009 Initial Biopsy   Biopsy of L breast    12/31/2009 Pathology Results   Invasive ductal carcinoma, ER/PR+, HER 2 negative   12/31/2009 Imaging   Ultrasound showing a 2.43 x 1.85 x 3 cm hypoechoic spiculated mass in the 12 o clock L breast retroareolar region   01/01/2010 -  Anti-estrogen oral therapy   Tamoxifen 20 mg daily   01/06/2010 Imaging   Bone scan abnormal uptake in the diaphysis of the R humerus, abnormal in the R third, fifth and sixth ribs, lesion also noted in the sternum.   02/03/2010 Surgery   Rod placement and fixation of R humerus by Dr. Amanda Pea   02/05/2010 - 02/18/2010  Radiation Therapy   30Gy in 10 fractions of 3 Gy per fraction to R pathologic fracture   03/11/2010 -  Chemotherapy   Denosumab monthly, now every 3 months. Started at [facility]    06/09/2016 Imaging   Three hypermetabolic osseous lesions in the sternum, left ilium and right ilium, as discussed above, likely represent osseous metastases. At this time, these are not recognizable on the CT images. 2. No extra skeletal metastatic disease identified in the neck, chest, abdomen or pelvis.   10/13/2016 Progression   PET shows various new and enlarging osseous metastatic lesions with no definite extra osseous metastatic disease currently identified.    12/31/2016 Progression   1. Multifocal hypermetabolic osseous metastases throughout the axial and proximal appendicular skeleton, which are increased in size, number and metabolism since 10/13/2016 PET-CT. 2. 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 3. No additional  sites of hypermetabolic metastatic disease. 4. Chronic right mastoid sinusitis. 5. Aortic atherosclerosis.  One vessel coronary atherosclerosis.   Multiple myeloma ([name]  02/12/2017 Bone Marrow Biopsy   The marrow was variably cellular with large peritrabecular aggregates of kappa restricted plasma cells (66% by aspirate, 30% by Cd138). Cytogenetics +11.    03/01/2017 - 06/29/2017 Chemotherapy   RVD    05/26/2017 Bone Marrow Biopsy   Performed at [facility]:  Plasma cell myeloma in a 30% cellular marrow with decreased trilineage hematopoiesis and 42% kappa light chain restricted plasma cells on the aspirate smears and large aggregates on the core biopsy.     07/12/2017 - 09/01/2017 Chemotherapy   3 cycles of carfizolmib/cyclophosphamide/dexamethasone     10/08/2017 Bone Marrow Transplant   Autotransplant at [facility]   02/28/2020 - 09/01/2022  Chemotherapy   Patient is on Treatment Plan : Daratumumab q28d     09/01/2022 -  Chemotherapy   Patient is on Treatment Plan : MYELOMA Daratumumab Faspro Subq q28d     Multiple myeloma not having achieved remission ([name]  06/29/2017 Initial Diagnosis   Multiple myeloma not having achieved remission ([name]   02/28/2020 - 09/01/2022 Chemotherapy   Patient is on Treatment Plan : Daratumumab q28d     09/01/2022 -  Chemotherapy   Patient is on Treatment Plan : MYELOMA Daratumumab Faspro Subq q28d        INTERVAL HISTORY:   Johnny Lucas is a 77 y.o. male presenting to clinic today for follow up of multiple myeloma. He was last seen by me on 06/22/23.  Today, he states that he is doing well overall. His appetite level is at 80%. His energy level is at 10%.  PAST MEDICAL HISTORY:   Past Medical History: Past Medical History:  Diagnosis Date   Anxiety    Bone metastases 09/10/2016   Breast cancer, male [facility]) 2011   Stage IV breast cancer; radiation and tamoxifen  Overview:  Left breast ca with mets bone  Overview:  METS TO BONE   Bronchitis    uses inhaler   Cough    uses inhaler   GERD (gastroesophageal reflux disease)    Headache    History of blood transfusion    Hypertension    Macular degeneration    bilateral   Memory loss    mild   Multiple myeloma ([name] 02/18/2017   Peripheral neuropathy    feet   Pneumonia    mild case 1-2 yrs ago (2023 or 2024)    Surgical History: Past Surgical History:  Procedure Laterality Date   BACK SURGERY     C2-C3 fusion   BREAST BIOPSY Left    cancer   CATARACT EXTRACTION W/PHACO Left 02/03/2019   Procedure: CATARACT EXTRACTION PHACO AND INTRAOCULAR LENS PLACEMENT (IOC);  Surgeon: Fabio Pierce, MD;  Location: [facility];  Service: Ophthalmology;  Laterality: Left;  CDE: 15.89   CATARACT EXTRACTION W/PHACO Right 07/25/2021   Procedure: CATARACT EXTRACTION PHACO AND INTRAOCULAR LENS PLACEMENT (IOC);  Surgeon: Fabio Pierce, MD;  Location: [facility];   Service: Ophthalmology;  Laterality: Right;  CDE   26.71   COLONOSCOPY     HERNIA REPAIR     PORTACATH PLACEMENT Left 06/10/2018   Procedure: INSERTION PORT-A-CATH;  Surgeon: Lucretia Roers, MD;  Location: [facility];  Service: General;  Laterality: Left;   right arm surgery     bone cancer and rod placed   SINUS ENDO WITH FUSION Bilateral 03/25/2023   Procedure:  FUNCTIONAL ENDOSCOPIC SINUS SURGERY WITH FUSION AND NAVIGATION;  Surgeon: [name], MD;  Location: [facility] OR;  Service: ENT;  Laterality: Bilateral;    Social History: Social History   Socioeconomic History   Marital status: Married    Spouse name: Not on file   Number of children: 3   Years of education: Not on file   Highest education level: Not on file  Occupational History   Not on file  Tobacco Use   Smoking status: Never   Smokeless tobacco: Former    Types: Chew  Vaping Use   Vaping status: Never Used  Substance and Sexual Activity   Alcohol use: Yes    Alcohol/week: 24.0 standard drinks of alcohol    Types: 24 Cans of beer per week    Comment: No ETOH in the last 6 months as of 02/2023   Drug use: No   Sexual activity: Yes  Other Topics Concern   Not on file  Social History Narrative   Not on file   Social Determinants of Health   Financial Resource Strain: Low Risk  (11/26/2020)   Overall Financial Resource Strain (CARDIA)    Difficulty of Paying Living Expenses: Not hard at all  Food Insecurity: Low Risk  (07/12/2023)   Received from [facility]   Food vital sign    Within the past 12 months, you worried that your food would run out before you got money to buy more: Never true    Within the past 12 months, the food you bought just didn't last and you didn't have money to get more. : Never true  Transportation Needs: Not on file (07/12/2023)  Physical Activity: Inactive (11/26/2020)   Exercise Vital Sign    Days of Exercise per Week: 0 days    Minutes of Exercise per Session: 0 min  Stress:  Stress Concern Present (11/26/2020)   [employer] of Occupational Health - Occupational Stress Questionnaire    Feeling of Stress : To some extent  Social Connections: Moderately Integrated (11/26/2020)   Social Connection and Isolation Panel [NHANES]    Frequency of Communication with Friends and Family: More than three times a week    Frequency of Social Gatherings with Friends and Family: Three times a week    Attends Religious Services: 1 to 4 times per year    Active Member of Clubs or Organizations: No    Attends Banker Meetings: Never    Marital Status: Married  Catering manager Violence: Not At Risk (11/26/2020)   Humiliation, Afraid, Rape, and Kick questionnaire    Fear of Current or Ex-Partner: No    Emotionally Abused: No    Physically Abused: No    Sexually Abused: No    Family History: Family History  Problem Relation Age of Onset   Stroke Mother    Cancer Maternal Aunt        cancer NOS; died in her 64s   Lung cancer Maternal Uncle        smoker    Current Medications:  Current Outpatient Medications:    acetaminophen (TYLENOL) 500 MG tablet, Take 1,000 mg by mouth daily., Disp: , Rfl:    acyclovir (ZOVIRAX) 800 MG tablet, TAKE 1 TABLET TWICE DAILY, Disp: 180 tablet, Rfl: 3   albuterol (VENTOLIN HFA) 108 (90 Base) MCG/ACT inhaler, Inhale 2 puffs into the lungs every 6 (six) hours as needed for wheezing or shortness of breath., Disp: , Rfl:    ALPRAZolam (XANAX) 0.5 MG  tablet, TAKE 1 TABLET BY MOUTH AT BEDTIME AS NEEDED FOR anxiety / SLEEP, Disp: 30 tablet, Rfl: 5   aspirin EC 81 MG tablet, Take 81 mg by mouth daily. , Disp: , Rfl:    budesonide (PULMICORT) 0.5 MG/2ML nebulizer solution, Add 2 mL (1 vial) of budesonide into 240 mL saline irrigation bottle and irrigate both nostrils morning and night (one bottle total per day)., Disp: , Rfl:    denosumab (XGEVA) 120 MG/1.7ML SOLN injection, Inject 120 mg into the skin every 30 (thirty) days. ,  Disp: , Rfl:    dexamethasone (DECADRON) 4 MG tablet, TAKE FIVE TABLETS BY MOUTH WEEKLY, Disp: 40 tablet, Rfl: 5   folic acid (FOLVITE) 1 MG tablet, TAKE 1 TABLET BY MOUTH DAILY, Disp: 90 tablet, Rfl: 2   gabapentin (NEURONTIN) 300 MG capsule, TAKE 1 CAPSULE THREE TIMES DAILY, Disp: 180 capsule, Rfl: 5   Loperamide HCl (IMODIUM PO), Take 2 mg by mouth daily as needed (diarrhea)., Disp: , Rfl:    loratadine (CLARITIN) 10 MG tablet, Take 1 tablet (10 mg total) by mouth daily., Disp: 90 tablet, Rfl: 6   metoprolol succinate (TOPROL-XL) 100 MG 24 hr tablet, Take 100 mg by mouth in the morning., Disp: , Rfl:    ondansetron (ZOFRAN) 8 MG tablet, TAKE 1 TABLET BY MOUTH EVERY 8 HOURS AS NEEDED FOR NAUSEA AND VOMITING, Disp: 30 tablet, Rfl: 2   pantoprazole (PROTONIX) 40 MG tablet, TAKE 1 TABLET EVERY OTHER DAY, Disp: 45 tablet, Rfl: 6   polyethylene glycol (MIRALAX / GLYCOLAX) packet, Take 17 g by mouth daily as needed for mild constipation., Disp: 14 each, Rfl: 0   pomalidomide (POMALYST) 2 MG capsule, TAKE 1 CAPSULE BY MOUTH EVERY DAY FOR 21 DAYS ON FOLLOWED BY 7 DAYS OFF, Disp: 21 capsule, Rfl: 0   sildenafil (VIAGRA) 100 MG tablet, 100 mg as needed for erectile dysfunction., Disp: , Rfl:    tamoxifen (NOLVADEX) 20 MG tablet, TAKE 1 TABLET EVERY DAY, Disp: 90 tablet, Rfl: 3 No current facility-administered medications for this visit.  Facility-Administered Medications Ordered in Other Visits:    ondansetron (ZOFRAN) 8 mg in sodium chloride 0.9 % 50 mL IVPB, 8 mg, Intravenous, Once, Lockamy, Randi L, NP-C   sodium chloride flush (NS) 0.9 % injection 10 mL, 10 mL, Intracatheter, Once PRN, Doreatha Massed, MD   Allergies: Allergies  Allergen Reactions   Cefepime     Suspected severe thrombocytopenia is a result of cefepime induced antigen platelet destruction    REVIEW OF SYSTEMS:   Review of Systems  Constitutional:  Negative for chills, fatigue and fever.  HENT:   Negative for lump/mass,  mouth sores, nosebleeds, sore throat and trouble swallowing.   Eyes:  Negative for eye problems.  Respiratory:  Negative for cough and shortness of breath.   Cardiovascular:  Negative for chest pain, leg swelling and palpitations.  Gastrointestinal:  Negative for abdominal pain, constipation, diarrhea, nausea and vomiting.  Genitourinary:  Negative for bladder incontinence, difficulty urinating, dysuria, frequency, hematuria and nocturia.   Musculoskeletal:  Negative for arthralgias, back pain, flank pain, myalgias and neck pain.  Skin:  Negative for itching and rash.  Neurological:  Negative for dizziness, headaches and numbness.  Hematological:  Does not bruise/bleed easily.  Psychiatric/Behavioral:  Negative for depression, sleep disturbance and suicidal ideas. The patient is not nervous/anxious.   All other systems reviewed and are negative.    VITALS:   Blood pressure 134/75, pulse 94, temperature 98.5 F (36.9 C),  temperature source Oral, resp. rate 20, weight 193 lb 3.2 oz (87.6 kg).  Wt Readings from Last 3 Encounters:  08/16/23 193 lb 3.2 oz (87.6 kg)  08/16/23 193 lb 3.2 oz (87.6 kg)  07/21/23 197 lb (89.4 kg)    Body mass index is 28.53 kg/m.  Performance status (ECOG): 1 - Symptomatic but completely ambulatory  PHYSICAL EXAM:   Physical Exam Vitals and nursing note reviewed. Exam conducted with a chaperone present.  Constitutional:      Appearance: Normal appearance.  Cardiovascular:     Rate and Rhythm: Normal rate and regular rhythm.     Pulses: Normal pulses.     Heart sounds: Normal heart sounds.  Pulmonary:     Effort: Pulmonary effort is normal.     Breath sounds: Normal breath sounds.  Abdominal:     Palpations: Abdomen is soft. There is no hepatomegaly, splenomegaly or mass.     Tenderness: There is no abdominal tenderness.  Musculoskeletal:     Right lower leg: No edema.     Left lower leg: No edema.  Lymphadenopathy:     Cervical: No cervical  adenopathy.     Right cervical: No superficial, deep or posterior cervical adenopathy.    Left cervical: No superficial, deep or posterior cervical adenopathy.     Upper Body:     Right upper body: No supraclavicular or axillary adenopathy.     Left upper body: No supraclavicular or axillary adenopathy.  Neurological:     General: No focal deficit present.     Mental Status: He is alert and oriented to person, place, and time.  Psychiatric:        Mood and Affect: Mood normal.        Behavior: Behavior normal.     LABS:      Latest Ref Rng & Units 08/11/2023   10:15 AM 07/21/2023   12:28 PM 06/15/2023   11:27 AM  CBC  WBC 4.0 - 10.5 K/uL 2.8  3.9  2.4   Hemoglobin 13.0 - 17.0 g/dL 28.4  13.2  9.1   Hematocrit 39.0 - 52.0 % 31.0  31.2  28.0   Platelets 150 - 400 K/uL 126  185  124       Latest Ref Rng & Units 08/11/2023   10:15 AM 07/21/2023   12:28 PM 06/15/2023   11:27 AM  CMP  Glucose 70 - 99 mg/dL 95  95  91   BUN 8 - 23 mg/dL 15  12  16    Creatinine 0.61 - 1.24 mg/dL 4.40  1.02  7.25   Sodium 135 - 145 mmol/L 135  137  136   Potassium 3.5 - 5.1 mmol/L 4.1  4.3  4.1   Chloride 98 - 111 mmol/L 107  109  106   CO2 22 - 32 mmol/L 24  23  23    Calcium 8.9 - 10.3 mg/dL 7.7  8.5  7.9   Total Protein 6.5 - 8.1 g/dL 5.2  5.4  4.9   Total Bilirubin 0.3 - 1.2 mg/dL 0.4  0.2  0.7   Alkaline Phos 38 - 126 U/L 31  31  26    AST 15 - 41 U/L 12  18  14    ALT 0 - 44 U/L 12  13  12       No results found for: "CEA1", "CEA" / No results found for: "CEA1", "CEA" No results found for: "PSA1" No results found for: "DGU440" No results found for: "  LKG401"  Lab Results  Component Value Date   TOTALPROTELP 4.7 (L) 08/11/2023   ALBUMINELP 3.1 08/11/2023   A1GS 0.3 08/11/2023   A2GS 0.6 08/11/2023   BETS 0.7 08/11/2023   GAMS 0.1 (L) 08/11/2023   MSPIKE 0.1 (H) 08/11/2023   SPEI Comment 08/11/2023   Lab Results  Component Value Date   TIBC 314 03/02/2023   TIBC 308 08/25/2022    TIBC 325 03/25/2020   FERRITIN 161 03/02/2023   FERRITIN 170 08/25/2022   FERRITIN 640 (H) 03/25/2020   IRONPCTSAT 11 (L) 03/02/2023   IRONPCTSAT 24 08/25/2022   IRONPCTSAT 10 (L) 03/25/2020   Lab Results  Component Value Date   LDH 131 11/18/2021   LDH 127 09/30/2021   LDH 102 08/05/2021     STUDIES:   No results found.

## 2023-08-16 ENCOUNTER — Inpatient Hospital Stay: Payer: Medicare Other

## 2023-08-16 ENCOUNTER — Inpatient Hospital Stay (HOSPITAL_BASED_OUTPATIENT_CLINIC_OR_DEPARTMENT_OTHER): Payer: Medicare Other | Admitting: Hematology

## 2023-08-16 ENCOUNTER — Encounter: Payer: Self-pay | Admitting: Hematology

## 2023-08-16 VITALS — BP 134/75 | HR 61 | Temp 98.5°F | Resp 20 | Wt 193.2 lb

## 2023-08-16 DIAGNOSIS — N179 Acute kidney failure, unspecified: Secondary | ICD-10-CM

## 2023-08-16 DIAGNOSIS — C50922 Malignant neoplasm of unspecified site of left male breast: Secondary | ICD-10-CM | POA: Diagnosis not present

## 2023-08-16 DIAGNOSIS — C9002 Multiple myeloma in relapse: Secondary | ICD-10-CM | POA: Diagnosis not present

## 2023-08-16 DIAGNOSIS — C7951 Secondary malignant neoplasm of bone: Secondary | ICD-10-CM

## 2023-08-16 DIAGNOSIS — C9000 Multiple myeloma not having achieved remission: Secondary | ICD-10-CM

## 2023-08-16 DIAGNOSIS — D702 Other drug-induced agranulocytosis: Secondary | ICD-10-CM

## 2023-08-16 DIAGNOSIS — Z5112 Encounter for antineoplastic immunotherapy: Secondary | ICD-10-CM | POA: Diagnosis not present

## 2023-08-16 DIAGNOSIS — Z79810 Long term (current) use of selective estrogen receptor modulators (SERMs): Secondary | ICD-10-CM | POA: Diagnosis not present

## 2023-08-16 DIAGNOSIS — D801 Nonfamilial hypogammaglobulinemia: Secondary | ICD-10-CM | POA: Diagnosis not present

## 2023-08-16 MED ORDER — FILGRASTIM-SNDZ 480 MCG/0.8ML IJ SOSY
480.0000 ug | PREFILLED_SYRINGE | Freq: Once | INTRAMUSCULAR | Status: AC
  Administered 2023-08-16: 480 ug via SUBCUTANEOUS
  Filled 2023-08-16: qty 0.8

## 2023-08-16 MED ORDER — EPOETIN ALFA-EPBX 10000 UNIT/ML IJ SOLN: 20000.0000 [IU] | Freq: Once | INTRAMUSCULAR | Status: DC

## 2023-08-16 MED ORDER — DARATUMUMAB-HYALURONIDASE-FIHJ 1800-30000 MG-UT/15ML ~~LOC~~ SOLN
1800.0000 mg | Freq: Once | SUBCUTANEOUS | Status: AC
  Administered 2023-08-16: 1800 mg via SUBCUTANEOUS
  Filled 2023-08-16: qty 15

## 2023-08-16 MED ORDER — FILGRASTIM-AYOW 480 MCG/0.8ML ~~LOC~~ SOSY
480.0000 ug | PREFILLED_SYRINGE | Freq: Once | SUBCUTANEOUS | Status: DC
  Filled 2023-08-16: qty 0.8

## 2023-08-16 NOTE — Progress Notes (Signed)
Patient presents today for Darzalex Faspro and follow up visit with Dr. Ellin Saba.   Labs for treatment from 08/11/2023. ANC 0.6. MD aware and instructed to proceed with treatment. Patient to receive Releuko 480 mcg and Darzalex Faspro.  Treatment given today per MD orders. Tolerated without adverse affects. Vital signs stable. No complaints at this time. Discharged from clinic ambulatory in stable condition. Alert and oriented x 3. F/U with [facility] as scheduled.

## 2023-08-16 NOTE — Patient Instructions (Addendum)
[facility]  Discharge Instructions  You were seen and examined today by Dr. Ellin Saba.  Dr. Ellin Saba discussed your most recent lab work and CT scan which revealed that everything looks good and stable.  Dr. Ellin Saba is holding the Xgeva bone injection since you are going to see your dentist. You will receive your treatment today along with white blood cell injection and Aranesp today.  Start your Pomalyst today.  Follow-up as scheduled in 8 weeks.    Thank you for choosing [facility] to provide your oncology and hematology care.   To afford each patient quality time with our provider, please arrive at least 15 minutes before your scheduled appointment time. You may need to reschedule your appointment if you arrive late (10 or more minutes). Arriving late affects you and other patients whose appointments are after yours.  Also, if you miss three or more appointments without notifying the office, you may be dismissed from the clinic at the provider's discretion.    Again, thank you for choosing [facility].  Our hope is that these requests will decrease the amount of time that you wait before being seen by our physicians.   If you have a lab appointment with the Cancer Center - please note that after April 8th, all labs will be drawn in the cancer center.  You do not have to check in or register with the main entrance as you have in the past but will complete your check-in at the cancer center.            _____________________________________________________________  Should you have questions after your visit to [facility], please contact our office at [phone] and follow the prompts.  Our office hours are 8:00 a.m. to 4:30 p.m. Monday - Thursday and 8:00 a.m. to 2:30 p.m. Friday.  Please note that voicemails left after 4:00 p.m. may not be returned until the following business day.  We are closed  weekends and all major holidays.  You do have access to a nurse 24-7, just call the main number to the clinic [phone] and do not press any options, hold on the line and a nurse will answer the phone.    For prescription refill requests, have your pharmacy contact our office and allow 72 hours.    Masks are no longer required in the cancer centers. If you would like for your care team to wear a mask while they are taking care of you, please let them know. You may have one support person who is at least 75 years old accompany you for your appointments.

## 2023-08-16 NOTE — Patient Instructions (Signed)
[facility]  Discharge Instructions: Thank you for choosing [facility] to provide your oncology and hematology care.  If you have a lab appointment with the Cancer Center - please note that after April 8th, 2024, all labs will be drawn in the cancer center.  You do not have to check in or register with the main entrance as you have in the past but will complete your check-in in the cancer center.  Wear comfortable clothing and clothing appropriate for easy access to any Portacath or PICC line.   We strive to give you quality time with your provider. You may need to reschedule your appointment if you arrive late (15 or more minutes).  Arriving late affects you and other patients whose appointments are after yours.  Also, if you miss three or more appointments without notifying the office, you may be dismissed from the clinic at the provider's discretion.      For prescription refill requests, have your pharmacy contact our office and allow 72 hours for refills to be completed.    Today you received the following chemotherapy and/or immunotherapy agents Darzalex Faspro.  Daratumumab Injection What is this medication? DARATUMUMAB (dar a toom ue mab) treats multiple myeloma, a type of bone marrow cancer. It works by helping your immune system slow or stop the spread of cancer cells. It is a monoclonal antibody. This medicine may be used for other purposes; ask your health care provider or pharmacist if you have questions. COMMON BRAND NAME(S): DARZALEX What should I tell my care team before I take this medication? They need to know if you have any of these conditions: Hereditary fructose intolerance Infection, such as chickenpox, herpes, hepatitis B Lung or breathing disease, such as asthma, COPD An unusual or allergic reaction to daratumumab, sorbitol, other medications, foods, dyes, or preservatives Pregnant or trying to get pregnant Breastfeeding How should  I use this medication? This medication is injected into a vein. It is given by your care team in a hospital or clinic setting. Talk to your care team about the use of this medication in children. Special care may be needed. Overdosage: If you think you have taken too much of this medicine contact a poison control center or emergency room at once. NOTE: This medicine is only for you. Do not share this medicine with others. What if I miss a dose? Keep appointments for follow-up doses. It is important not to miss your dose. Call your care team if you are unable to keep an appointment. What may interact with this medication? Interactions have not been studied. This list may not describe all possible interactions. Give your health care provider a list of all the medicines, herbs, non-prescription drugs, or dietary supplements you use. Also tell them if you smoke, drink alcohol, or use illegal drugs. Some items may interact with your medicine. What should I watch for while using this medication? Your condition will be monitored carefully while you are receiving this medication. This medication can cause serious allergic reactions. To reduce your risk, your care team may give you other medication to take before receiving this one. Be sure to follow the directions from your care team. This medication can affect the results of blood tests to match your blood type. These changes can last for up to 6 months after the final dose. Your care team will do blood tests to match your blood type before you start treatment. Tell all of your care team that  you are being treated with this medication before receiving a blood transfusion. This medication can affect the results of some tests used to determine treatment response; extra tests may be needed to evaluate response. Talk to your care team if you wish to become pregnant or think you are pregnant. This medication can cause serious birth defects if taken during  pregnancy and for 3 months after the last dose. A reliable form of contraception is recommended while taking this medication and for 3 months after the last dose. Talk to your care team about effective forms of contraception. Do not breast-feed while taking this medication. What side effects may I notice from receiving this medication? Side effects that you should report to your care team as soon as possible: Allergic reactions--skin rash, itching, hives, swelling of the face, lips, tongue, or throat Infection--fever, chills, cough, sore throat, wounds that don't heal, pain or trouble when passing urine, general feeling of discomfort or being unwell Infusion reactions--chest pain, shortness of breath or trouble breathing, feeling faint or lightheaded Unusual bruising or bleeding Side effects that usually do not require medical attention (report to your care team if they continue or are bothersome): Constipation Diarrhea Fatigue Nausea Pain, tingling, or numbness in the hands or feet Swelling of the ankles, hands, or feet This list may not describe all possible side effects. Call your doctor for medical advice about side effects. You may report side effects to FDA at 1-800-FDA-1088. Where should I keep my medication? This medication is given in a hospital or clinic. It will not be stored at home. NOTE: This sheet is a summary. It may not cover all possible information. If you have questions about this medicine, talk to your doctor, pharmacist, or health care provider.  2024 Elsevier/Gold Standard (2022-10-22 00:00:00)       To help prevent nausea and vomiting after your treatment, we encourage you to take your nausea medication as directed.  BELOW ARE SYMPTOMS THAT SHOULD BE REPORTED IMMEDIATELY: *FEVER GREATER THAN 100.4 F (38 C) OR HIGHER *CHILLS OR SWEATING *NAUSEA AND VOMITING THAT IS NOT CONTROLLED WITH YOUR NAUSEA MEDICATION *UNUSUAL SHORTNESS OF BREATH *UNUSUAL BRUISING OR  BLEEDING *URINARY PROBLEMS (pain or burning when urinating, or frequent urination) *BOWEL PROBLEMS (unusual diarrhea, constipation, pain near the anus) TENDERNESS IN MOUTH AND THROAT WITH OR WITHOUT PRESENCE OF ULCERS (sore throat, sores in mouth, or a toothache) UNUSUAL RASH, SWELLING OR PAIN  UNUSUAL VAGINAL DISCHARGE OR ITCHING   Items with * indicate a potential emergency and should be followed up as soon as possible or go to the Emergency Department if any problems should occur.  Please show the CHEMOTHERAPY ALERT CARD or IMMUNOTHERAPY ALERT CARD at check-in to the Emergency Department and triage nurse.  Should you have questions after your visit or need to cancel or reschedule your appointment, please contact [facility] [phone]  and follow the prompts.  Office hours are 8:00 a.m. to 4:30 p.m. Monday - Friday. Please note that voicemails left after 4:00 p.m. may not be returned until the following business day.  We are closed weekends and major holidays. You have access to a nurse at all times for urgent questions. Please call the main number to the clinic [phone] and follow the prompts.  For any non-urgent questions, you may also contact your provider using MyChart. We now offer e-Visits for anyone 13 and older to request care online for non-urgent symptoms. For details visit [email or web address].   Also download the MyChart  app! Go to the app store, search "MyChart", open the app, select [place], and log in with your MyChart username and password.

## 2023-08-17 ENCOUNTER — Inpatient Hospital Stay: Payer: Medicare Other

## 2023-08-17 ENCOUNTER — Other Ambulatory Visit: Payer: Medicare Other

## 2023-08-17 ENCOUNTER — Inpatient Hospital Stay: Payer: Medicare Other | Admitting: Hematology

## 2023-08-17 ENCOUNTER — Other Ambulatory Visit: Payer: Self-pay

## 2023-08-18 ENCOUNTER — Inpatient Hospital Stay: Payer: Medicare Other

## 2023-08-18 LAB — IMMUNOFIXATION ELECTROPHORESIS
IgA: <5 mg/dL — ABNORMAL LOW (ref 61–437)
IgG (Immunoglobin G), Serum: 182 mg/dL — ABNORMAL LOW (ref 603–1613)
IgM (Immunoglobulin M), Srm: 5 mg/dL — ABNORMAL LOW (ref 15–143)
Total Protein ELP: 4.6 g/dL — ABNORMAL LOW (ref 6.0–8.5)

## 2023-08-19 ENCOUNTER — Other Ambulatory Visit: Payer: Self-pay | Admitting: *Deleted

## 2023-08-19 ENCOUNTER — Other Ambulatory Visit: Payer: Self-pay | Admitting: Hematology

## 2023-08-19 DIAGNOSIS — Z170 Estrogen receptor positive status [ER+]: Secondary | ICD-10-CM

## 2023-08-19 DIAGNOSIS — C9000 Multiple myeloma not having achieved remission: Secondary | ICD-10-CM

## 2023-08-19 NOTE — Telephone Encounter (Signed)
Tamoxifen refill approved.  Patient is tolerating and is to continue therapy. 

## 2023-08-23 ENCOUNTER — Other Ambulatory Visit: Payer: Self-pay

## 2023-08-23 DIAGNOSIS — I10 Essential (primary) hypertension: Secondary | ICD-10-CM

## 2023-08-23 DIAGNOSIS — G4700 Insomnia, unspecified: Secondary | ICD-10-CM

## 2023-08-23 MED ORDER — POMALIDOMIDE 2 MG PO CAPS
ORAL_CAPSULE | ORAL | 0 refills | Status: DC
Start: 2023-08-23 — End: 2023-09-15

## 2023-08-23 NOTE — Telephone Encounter (Signed)
Chart reviewed. Pomalyst refilled per last office note with Dr. Katragadda.  

## 2023-08-24 ENCOUNTER — Other Ambulatory Visit: Payer: Self-pay

## 2023-08-25 ENCOUNTER — Ambulatory Visit: Payer: Medicare Other | Admitting: Hematology

## 2023-08-28 DIAGNOSIS — Z6827 Body mass index (BMI) 27.0-27.9, adult: Secondary | ICD-10-CM | POA: Diagnosis not present

## 2023-08-28 DIAGNOSIS — R059 Cough, unspecified: Secondary | ICD-10-CM | POA: Diagnosis not present

## 2023-08-28 DIAGNOSIS — K120 Recurrent oral aphthae: Secondary | ICD-10-CM | POA: Diagnosis not present

## 2023-08-28 DIAGNOSIS — R030 Elevated blood-pressure reading, without diagnosis of hypertension: Secondary | ICD-10-CM | POA: Diagnosis not present

## 2023-08-28 DIAGNOSIS — Z20828 Contact with and (suspected) exposure to other viral communicable diseases: Secondary | ICD-10-CM | POA: Diagnosis not present

## 2023-08-30 ENCOUNTER — Other Ambulatory Visit: Payer: Self-pay | Admitting: Hematology

## 2023-08-30 DIAGNOSIS — C9000 Multiple myeloma not having achieved remission: Secondary | ICD-10-CM

## 2023-08-31 ENCOUNTER — Encounter: Payer: Self-pay | Admitting: Hematology

## 2023-08-31 ENCOUNTER — Encounter (HOSPITAL_COMMUNITY): Payer: Self-pay | Admitting: Hematology

## 2023-09-09 ENCOUNTER — Telehealth: Payer: Self-pay | Admitting: *Deleted

## 2023-09-09 NOTE — Telephone Encounter (Signed)
Patient called stating that he has been under the care of a dentist for a viral infection in his mouth.  Is on chlorhexidine gluuconate mouthwash for mouth sores. Says that he has lost 10 pounds over the last 2 weeks, due to the inability to eat secondary to mouth pain.  Is feeling weak.  Per Dr. Ellin Saba, will bring him in tomorrow for labs and fluids.  Patient is aware.

## 2023-09-10 ENCOUNTER — Inpatient Hospital Stay: Payer: Medicare Other | Attending: Hematology

## 2023-09-10 ENCOUNTER — Inpatient Hospital Stay: Payer: Medicare Other

## 2023-09-10 VITALS — BP 124/67 | HR 66 | Temp 98.4°F | Resp 20 | Wt 184.9 lb

## 2023-09-10 VITALS — BP 133/71 | HR 60 | Temp 98.5°F | Resp 20

## 2023-09-10 DIAGNOSIS — C9000 Multiple myeloma not having achieved remission: Secondary | ICD-10-CM | POA: Diagnosis not present

## 2023-09-10 DIAGNOSIS — Z5112 Encounter for antineoplastic immunotherapy: Secondary | ICD-10-CM | POA: Insufficient documentation

## 2023-09-10 DIAGNOSIS — E860 Dehydration: Secondary | ICD-10-CM

## 2023-09-10 DIAGNOSIS — R7989 Other specified abnormal findings of blood chemistry: Secondary | ICD-10-CM

## 2023-09-10 DIAGNOSIS — Z95828 Presence of other vascular implants and grafts: Secondary | ICD-10-CM

## 2023-09-10 LAB — CBC WITH DIFFERENTIAL/PLATELET
Abs Immature Granulocytes: 0.01 10*3/uL (ref 0.00–0.07)
Basophils Absolute: 0.1 10*3/uL (ref 0.0–0.1)
Basophils Relative: 2 %
Eosinophils Absolute: 0.2 10*3/uL (ref 0.0–0.5)
Eosinophils Relative: 4 %
HCT: 29 % — ABNORMAL LOW (ref 39.0–52.0)
Hemoglobin: 9.6 g/dL — ABNORMAL LOW (ref 13.0–17.0)
Immature Granulocytes: 0 %
Lymphocytes Relative: 36 %
Lymphs Abs: 1.4 10*3/uL (ref 0.7–4.0)
MCH: 35.3 pg — ABNORMAL HIGH (ref 26.0–34.0)
MCHC: 33.1 g/dL (ref 30.0–36.0)
MCV: 106.6 fL — ABNORMAL HIGH (ref 80.0–100.0)
Monocytes Absolute: 0.6 10*3/uL (ref 0.1–1.0)
Monocytes Relative: 16 %
Neutro Abs: 1.5 10*3/uL — ABNORMAL LOW (ref 1.7–7.7)
Neutrophils Relative %: 42 %
Platelets: 188 10*3/uL (ref 150–400)
RBC: 2.72 MIL/uL — ABNORMAL LOW (ref 4.22–5.81)
RDW: 14.7 % (ref 11.5–15.5)
WBC: 3.8 10*3/uL — ABNORMAL LOW (ref 4.0–10.5)
nRBC: 0 % (ref 0.0–0.2)

## 2023-09-10 LAB — COMPREHENSIVE METABOLIC PANEL
ALT: 24 U/L (ref 0–44)
AST: 18 U/L (ref 15–41)
Albumin: 3 g/dL — ABNORMAL LOW (ref 3.5–5.0)
Alkaline Phosphatase: 34 U/L — ABNORMAL LOW (ref 38–126)
Anion gap: 7 (ref 5–15)
BUN: 14 mg/dL (ref 8–23)
CO2: 22 mmol/L (ref 22–32)
Calcium: 7.8 mg/dL — ABNORMAL LOW (ref 8.9–10.3)
Chloride: 104 mmol/L (ref 98–111)
Creatinine, Ser: 1.31 mg/dL — ABNORMAL HIGH (ref 0.61–1.24)
GFR, Estimated: 57 mL/min — ABNORMAL LOW (ref >60)
Glucose, Bld: 110 mg/dL — ABNORMAL HIGH (ref 70–99)
Potassium: 4.2 mmol/L (ref 3.5–5.1)
Sodium: 133 mmol/L — ABNORMAL LOW (ref 135–145)
Total Bilirubin: 0.5 mg/dL (ref 0.3–1.2)
Total Protein: 5.3 g/dL — ABNORMAL LOW (ref 6.5–8.1)

## 2023-09-10 LAB — MAGNESIUM: Magnesium: 2.1 mg/dL (ref 1.7–2.4)

## 2023-09-10 MED ORDER — SODIUM CHLORIDE 0.9% FLUSH
10.0000 mL | INTRAVENOUS | Status: DC | PRN
  Administered 2023-09-10: 10 mL via INTRAVENOUS

## 2023-09-10 MED ORDER — SODIUM CHLORIDE 0.9 % IV SOLN: Freq: Once | INTRAVENOUS | Status: AC

## 2023-09-10 MED ORDER — HEPARIN SOD (PORK) LOCK FLUSH 100 UNIT/ML IV SOLN
500.0000 [IU] | Freq: Once | INTRAVENOUS | Status: AC
  Administered 2023-09-10: 500 [IU] via INTRAVENOUS

## 2023-09-10 NOTE — Progress Notes (Signed)
Patient presents today for IVF.  Patient is feeling weak today, but overall satisfactory.  Vital signs are stable.  Potassium and magnesium are WNL today.  Creatinine is slightly elevated and sodium is 133.  Dr. Anders Simmonds reviewed labs.  We will give 1 L of NS over 2 hours today per MD.    Patient tolerated IVF well with no complaints voiced.  Patient left ambulatory in stable condition.  Vital signs stable at discharge.  Follow up as scheduled.

## 2023-09-10 NOTE — Patient Instructions (Signed)
[facility]  Discharge Instructions: Thank you for choosing [facility] to provide your oncology and hematology care.  If you have a lab appointment with the Cancer Center - please note that after April 8th, 2024, all labs will be drawn in the cancer center.  You do not have to check in or register with the main entrance as you have in the past but will complete your check-in in the cancer center.  Wear comfortable clothing and clothing appropriate for easy access to any Portacath or PICC line.   We strive to give you quality time with your provider. You may need to reschedule your appointment if you arrive late (15 or more minutes).  Arriving late affects you and other patients whose appointments are after yours.  Also, if you miss three or more appointments without notifying the office, you may be dismissed from the clinic at the provider's discretion.      For prescription refill requests, have your pharmacy contact our office and allow 72 hours for refills to be completed.     To help prevent nausea and vomiting after your treatment, we encourage you to take your nausea medication as directed.  BELOW ARE SYMPTOMS THAT SHOULD BE REPORTED IMMEDIATELY: *FEVER GREATER THAN 100.4 F (38 C) OR HIGHER *CHILLS OR SWEATING *NAUSEA AND VOMITING THAT IS NOT CONTROLLED WITH YOUR NAUSEA MEDICATION *UNUSUAL SHORTNESS OF BREATH *UNUSUAL BRUISING OR BLEEDING *URINARY PROBLEMS (pain or burning when urinating, or frequent urination) *BOWEL PROBLEMS (unusual diarrhea, constipation, pain near the anus) TENDERNESS IN MOUTH AND THROAT WITH OR WITHOUT PRESENCE OF ULCERS (sore throat, sores in mouth, or a toothache) UNUSUAL RASH, SWELLING OR PAIN  UNUSUAL VAGINAL DISCHARGE OR ITCHING   Items with * indicate a potential emergency and should be followed up as soon as possible or go to the Emergency Department if any problems should occur.  Please show the CHEMOTHERAPY ALERT  CARD or IMMUNOTHERAPY ALERT CARD at check-in to the Emergency Department and triage nurse.  Should you have questions after your visit or need to cancel or reschedule your appointment, please contact [facility] [phone]  and follow the prompts.  Office hours are 8:00 a.m. to 4:30 p.m. Monday - Friday. Please note that voicemails left after 4:00 p.m. may not be returned until the following business day.  We are closed weekends and major holidays. You have access to a nurse at all times for urgent questions. Please call the main number to the clinic [phone] and follow the prompts.  For any non-urgent questions, you may also contact your provider using MyChart. We now offer e-Visits for anyone 83 and older to request care online for non-urgent symptoms. For details visit [email or web address].   Also download the MyChart app! Go to the app store, search "MyChart", open the app, select [place], and log in with your MyChart username and password.

## 2023-09-14 ENCOUNTER — Inpatient Hospital Stay: Payer: Medicare Other

## 2023-09-14 DIAGNOSIS — C9000 Multiple myeloma not having achieved remission: Secondary | ICD-10-CM

## 2023-09-14 DIAGNOSIS — Z5112 Encounter for antineoplastic immunotherapy: Secondary | ICD-10-CM | POA: Diagnosis not present

## 2023-09-14 MED ORDER — DARATUMUMAB-HYALURONIDASE-FIHJ 1800-30000 MG-UT/15ML ~~LOC~~ SOLN
1800.0000 mg | Freq: Once | SUBCUTANEOUS | Status: AC
  Administered 2023-09-14: 1800 mg via SUBCUTANEOUS
  Filled 2023-09-14: qty 15

## 2023-09-14 NOTE — Patient Instructions (Signed)
[facility]  Discharge Instructions: Thank you for choosing [facility] to provide your oncology and hematology care.  If you have a lab appointment with the Cancer Center - please note that after April 8th, 2024, all labs will be drawn in the cancer center.  You do not have to check in or register with the main entrance as you have in the past but will complete your check-in in the cancer center.  Wear comfortable clothing and clothing appropriate for easy access to any Portacath or PICC line.   We strive to give you quality time with your provider. You may need to reschedule your appointment if you arrive late (15 or more minutes).  Arriving late affects you and other patients whose appointments are after yours.  Also, if you miss three or more appointments without notifying the office, you may be dismissed from the clinic at the provider's discretion.      For prescription refill requests, have your pharmacy contact our office and allow 72 hours for refills to be completed.    Today you received the following chemotherapy and/or immunotherapy agents Daratumumab.   Daratumumab Injection What is this medication? DARATUMUMAB (dar a toom ue mab) treats multiple myeloma, a type of bone marrow cancer. It works by helping your immune system slow or stop the spread of cancer cells. It is a monoclonal antibody. This medicine may be used for other purposes; ask your health care provider or pharmacist if you have questions. COMMON BRAND NAME(S): DARZALEX What should I tell my care team before I take this medication? They need to know if you have any of these conditions: Hereditary fructose intolerance Infection, such as chickenpox, herpes, hepatitis B Lung or breathing disease, such as asthma, COPD An unusual or allergic reaction to daratumumab, sorbitol, other medications, foods, dyes, or preservatives Pregnant or trying to get pregnant Breastfeeding How should I  use this medication? This medication is injected into a vein. It is given by your care team in a hospital or clinic setting. Talk to your care team about the use of this medication in children. Special care may be needed. Overdosage: If you think you have taken too much of this medicine contact a poison control center or emergency room at once. NOTE: This medicine is only for you. Do not share this medicine with others. What if I miss a dose? Keep appointments for follow-up doses. It is important not to miss your dose. Call your care team if you are unable to keep an appointment. What may interact with this medication? Interactions have not been studied. This list may not describe all possible interactions. Give your health care provider a list of all the medicines, herbs, non-prescription drugs, or dietary supplements you use. Also tell them if you smoke, drink alcohol, or use illegal drugs. Some items may interact with your medicine. What should I watch for while using this medication? Your condition will be monitored carefully while you are receiving this medication. This medication can cause serious allergic reactions. To reduce your risk, your care team may give you other medication to take before receiving this one. Be sure to follow the directions from your care team. This medication can affect the results of blood tests to match your blood type. These changes can last for up to 6 months after the final dose. Your care team will do blood tests to match your blood type before you start treatment. Tell all of your care team that  you are being treated with this medication before receiving a blood transfusion. This medication can affect the results of some tests used to determine treatment response; extra tests may be needed to evaluate response. Talk to your care team if you wish to become pregnant or think you are pregnant. This medication can cause serious birth defects if taken during pregnancy  and for 3 months after the last dose. A reliable form of contraception is recommended while taking this medication and for 3 months after the last dose. Talk to your care team about effective forms of contraception. Do not breast-feed while taking this medication. What side effects may I notice from receiving this medication? Side effects that you should report to your care team as soon as possible: Allergic reactions--skin rash, itching, hives, swelling of the face, lips, tongue, or throat Infection--fever, chills, cough, sore throat, wounds that don't heal, pain or trouble when passing urine, general feeling of discomfort or being unwell Infusion reactions--chest pain, shortness of breath or trouble breathing, feeling faint or lightheaded Unusual bruising or bleeding Side effects that usually do not require medical attention (report to your care team if they continue or are bothersome): Constipation Diarrhea Fatigue Nausea Pain, tingling, or numbness in the hands or feet Swelling of the ankles, hands, or feet This list may not describe all possible side effects. Call your doctor for medical advice about side effects. You may report side effects to FDA at 1-800-FDA-1088. Where should I keep my medication? This medication is given in a hospital or clinic. It will not be stored at home. NOTE: This sheet is a summary. It may not cover all possible information. If you have questions about this medicine, talk to your doctor, pharmacist, or health care provider.  2024 Elsevier/Gold Standard (2022-10-22 00:00:00)       To help prevent nausea and vomiting after your treatment, we encourage you to take your nausea medication as directed.  BELOW ARE SYMPTOMS THAT SHOULD BE REPORTED IMMEDIATELY: *FEVER GREATER THAN 100.4 F (38 C) OR HIGHER *CHILLS OR SWEATING *NAUSEA AND VOMITING THAT IS NOT CONTROLLED WITH YOUR NAUSEA MEDICATION *UNUSUAL SHORTNESS OF BREATH *UNUSUAL BRUISING OR  BLEEDING *URINARY PROBLEMS (pain or burning when urinating, or frequent urination) *BOWEL PROBLEMS (unusual diarrhea, constipation, pain near the anus) TENDERNESS IN MOUTH AND THROAT WITH OR WITHOUT PRESENCE OF ULCERS (sore throat, sores in mouth, or a toothache) UNUSUAL RASH, SWELLING OR PAIN  UNUSUAL VAGINAL DISCHARGE OR ITCHING   Items with * indicate a potential emergency and should be followed up as soon as possible or go to the Emergency Department if any problems should occur.  Please show the CHEMOTHERAPY ALERT CARD or IMMUNOTHERAPY ALERT CARD at check-in to the Emergency Department and triage nurse.  Should you have questions after your visit or need to cancel or reschedule your appointment, please contact [facility] AT [facility] [phone]  and follow the prompts.  Office hours are 8:00 a.m. to 4:30 p.m. Monday - Friday. Please note that voicemails left after 4:00 p.m. may not be returned until the following business day.  We are closed weekends and major holidays. You have access to a nurse at all times for urgent questions. Please call the main number to the clinic [phone] and follow the prompts.  For any non-urgent questions, you may also contact your provider using MyChart. We now offer e-Visits for anyone 34 and older to request care online for non-urgent symptoms. For details visit [email or web address].   Also download the MyChart  app! Go to the app store, search "MyChart", open the app, select [place], and log in with your MyChart username and password.

## 2023-09-14 NOTE — Progress Notes (Signed)
No port flush needed to day per MD.

## 2023-09-14 NOTE — Progress Notes (Signed)
Patient presents today for chemotherapy infusion of Dara.  Patient is in satisfactory condition with no new complaints voiced.  Vital signs are stable. Patient reports taking premedications of Claritin, decadron, and Tylenol at 6:30 this morning prior to coming.  Labs reviewed from Friday and all labs are within treatment parameters.  We will proceed with treatment per MD orders.    Patient tolerated treatment well with no complaints voiced.  Patient left ambulatory in stable condition.  Vital signs stable at discharge.  Follow up as scheduled.

## 2023-09-15 ENCOUNTER — Inpatient Hospital Stay: Payer: Medicare Other

## 2023-09-15 ENCOUNTER — Other Ambulatory Visit: Payer: Self-pay

## 2023-09-15 DIAGNOSIS — G4700 Insomnia, unspecified: Secondary | ICD-10-CM

## 2023-09-15 DIAGNOSIS — I10 Essential (primary) hypertension: Secondary | ICD-10-CM

## 2023-09-15 MED ORDER — POMALIDOMIDE 2 MG PO CAPS
ORAL_CAPSULE | ORAL | 0 refills | Status: DC
Start: 2023-09-15 — End: 2023-10-15

## 2023-09-15 NOTE — Telephone Encounter (Signed)
Chart reviewed. Revlimid refilled per last office note with Dr. Katragadda.  

## 2023-09-28 ENCOUNTER — Other Ambulatory Visit: Payer: Self-pay

## 2023-09-30 DIAGNOSIS — L570 Actinic keratosis: Secondary | ICD-10-CM | POA: Diagnosis not present

## 2023-09-30 DIAGNOSIS — X32XXXD Exposure to sunlight, subsequent encounter: Secondary | ICD-10-CM | POA: Diagnosis not present

## 2023-09-30 DIAGNOSIS — C4401 Basal cell carcinoma of skin of lip: Secondary | ICD-10-CM | POA: Diagnosis not present

## 2023-09-30 DIAGNOSIS — L820 Inflamed seborrheic keratosis: Secondary | ICD-10-CM | POA: Diagnosis not present

## 2023-09-30 DIAGNOSIS — D045 Carcinoma in situ of skin of trunk: Secondary | ICD-10-CM | POA: Diagnosis not present

## 2023-09-30 DIAGNOSIS — D225 Melanocytic nevi of trunk: Secondary | ICD-10-CM | POA: Diagnosis not present

## 2023-10-05 ENCOUNTER — Inpatient Hospital Stay: Payer: Medicare Other | Attending: Hematology

## 2023-10-05 DIAGNOSIS — G629 Polyneuropathy, unspecified: Secondary | ICD-10-CM | POA: Diagnosis not present

## 2023-10-05 DIAGNOSIS — Z9221 Personal history of antineoplastic chemotherapy: Secondary | ICD-10-CM | POA: Diagnosis not present

## 2023-10-05 DIAGNOSIS — Z923 Personal history of irradiation: Secondary | ICD-10-CM | POA: Insufficient documentation

## 2023-10-05 DIAGNOSIS — N189 Chronic kidney disease, unspecified: Secondary | ICD-10-CM | POA: Diagnosis not present

## 2023-10-05 DIAGNOSIS — I129 Hypertensive chronic kidney disease with stage 1 through stage 4 chronic kidney disease, or unspecified chronic kidney disease: Secondary | ICD-10-CM | POA: Insufficient documentation

## 2023-10-05 DIAGNOSIS — C9002 Multiple myeloma in relapse: Secondary | ICD-10-CM | POA: Diagnosis not present

## 2023-10-05 DIAGNOSIS — Z801 Family history of malignant neoplasm of trachea, bronchus and lung: Secondary | ICD-10-CM | POA: Insufficient documentation

## 2023-10-05 DIAGNOSIS — Z9484 Stem cells transplant status: Secondary | ICD-10-CM | POA: Insufficient documentation

## 2023-10-05 DIAGNOSIS — Z5112 Encounter for antineoplastic immunotherapy: Secondary | ICD-10-CM | POA: Diagnosis not present

## 2023-10-05 DIAGNOSIS — K219 Gastro-esophageal reflux disease without esophagitis: Secondary | ICD-10-CM | POA: Insufficient documentation

## 2023-10-05 DIAGNOSIS — Z7961 Long term (current) use of immunomodulator: Secondary | ICD-10-CM | POA: Diagnosis not present

## 2023-10-05 DIAGNOSIS — C9000 Multiple myeloma not having achieved remission: Secondary | ICD-10-CM

## 2023-10-05 DIAGNOSIS — D631 Anemia in chronic kidney disease: Secondary | ICD-10-CM | POA: Diagnosis not present

## 2023-10-05 DIAGNOSIS — D801 Nonfamilial hypogammaglobulinemia: Secondary | ICD-10-CM | POA: Insufficient documentation

## 2023-10-05 DIAGNOSIS — Z79810 Long term (current) use of selective estrogen receptor modulators (SERMs): Secondary | ICD-10-CM | POA: Diagnosis not present

## 2023-10-05 DIAGNOSIS — Z7982 Long term (current) use of aspirin: Secondary | ICD-10-CM | POA: Diagnosis not present

## 2023-10-05 DIAGNOSIS — C7951 Secondary malignant neoplasm of bone: Secondary | ICD-10-CM

## 2023-10-05 LAB — COMPREHENSIVE METABOLIC PANEL
ALT: 12 U/L (ref 0–44)
AST: 10 U/L — ABNORMAL LOW (ref 15–41)
Albumin: 3 g/dL — ABNORMAL LOW (ref 3.5–5.0)
Alkaline Phosphatase: 30 U/L — ABNORMAL LOW (ref 38–126)
Anion gap: 8 (ref 5–15)
BUN: 15 mg/dL (ref 8–23)
CO2: 23 mmol/L (ref 22–32)
Calcium: 8.5 mg/dL — ABNORMAL LOW (ref 8.9–10.3)
Chloride: 102 mmol/L (ref 98–111)
Creatinine, Ser: 1.39 mg/dL — ABNORMAL HIGH (ref 0.61–1.24)
GFR, Estimated: 53 mL/min — ABNORMAL LOW (ref >60)
Glucose, Bld: 91 mg/dL (ref 70–99)
Potassium: 4 mmol/L (ref 3.5–5.1)
Sodium: 133 mmol/L — ABNORMAL LOW (ref 135–145)
Total Bilirubin: 0.8 mg/dL (ref 0.3–1.2)
Total Protein: 5.3 g/dL — ABNORMAL LOW (ref 6.5–8.1)

## 2023-10-05 LAB — CBC WITH DIFFERENTIAL/PLATELET
Abs Immature Granulocytes: 0 10*3/uL (ref 0.00–0.07)
Basophils Absolute: 0 10*3/uL (ref 0.0–0.1)
Basophils Relative: 1 %
Eosinophils Absolute: 0.2 10*3/uL (ref 0.0–0.5)
Eosinophils Relative: 6 %
HCT: 27.9 % — ABNORMAL LOW (ref 39.0–52.0)
Hemoglobin: 9.2 g/dL — ABNORMAL LOW (ref 13.0–17.0)
Lymphocytes Relative: 34 %
Lymphs Abs: 1 10*3/uL (ref 0.7–4.0)
MCH: 35.4 pg — ABNORMAL HIGH (ref 26.0–34.0)
MCHC: 33 g/dL (ref 30.0–36.0)
MCV: 107.3 fL — ABNORMAL HIGH (ref 80.0–100.0)
Monocytes Absolute: 0.6 10*3/uL (ref 0.1–1.0)
Monocytes Relative: 23 %
Neutro Abs: 1 10*3/uL — ABNORMAL LOW (ref 1.7–7.7)
Neutrophils Relative %: 36 %
Platelets: 161 10*3/uL (ref 150–400)
RBC: 2.6 MIL/uL — ABNORMAL LOW (ref 4.22–5.81)
RDW: 15.3 % (ref 11.5–15.5)
WBC: 2.8 10*3/uL — ABNORMAL LOW (ref 4.0–10.5)
nRBC: 0 % (ref 0.0–0.2)

## 2023-10-05 LAB — MAGNESIUM: Magnesium: 1.9 mg/dL (ref 1.7–2.4)

## 2023-10-05 MED ORDER — SODIUM CHLORIDE 0.9% FLUSH
10.0000 mL | Freq: Once | INTRAVENOUS | Status: AC
  Administered 2023-10-05: 10 mL via INTRAVENOUS

## 2023-10-05 MED ORDER — HEPARIN SOD (PORK) LOCK FLUSH 100 UNIT/ML IV SOLN
500.0000 [IU] | Freq: Once | INTRAVENOUS | Status: AC
  Administered 2023-10-05: 500 [IU] via INTRAVENOUS

## 2023-10-05 NOTE — Progress Notes (Signed)
Patients port flushed without difficulty.  Good blood return noted with no bruising or swelling noted at site.  Band aid applied.  VSS with discharge and left in satisfactory condition with no s/s of distress noted.   

## 2023-10-06 LAB — KAPPA/LAMBDA LIGHT CHAINS
Kappa free light chain: 3.9 mg/L (ref 3.3–19.4)
Kappa, lambda light chain ratio: 1.7 — ABNORMAL HIGH (ref 0.26–1.65)
Lambda free light chains: 2.3 mg/L — ABNORMAL LOW (ref 5.7–26.3)

## 2023-10-08 LAB — PROTEIN ELECTROPHORESIS, SERUM
A/G Ratio: 1.7 (ref 0.7–1.7)
Albumin ELP: 3 g/dL (ref 2.9–4.4)
Alpha-1-Globulin: 0.3 g/dL (ref 0.0–0.4)
Alpha-2-Globulin: 0.7 g/dL (ref 0.4–1.0)
Beta Globulin: 0.7 g/dL (ref 0.7–1.3)
Gamma Globulin: 0.1 g/dL — ABNORMAL LOW (ref 0.4–1.8)
Globulin, Total: 1.8 g/dL — ABNORMAL LOW (ref 2.2–3.9)
M-Spike, %: 0.1 g/dL — ABNORMAL HIGH
Total Protein ELP: 4.8 g/dL — ABNORMAL LOW (ref 6.0–8.5)

## 2023-10-11 ENCOUNTER — Encounter: Payer: Self-pay | Admitting: Hematology

## 2023-10-12 ENCOUNTER — Inpatient Hospital Stay: Payer: Medicare Other

## 2023-10-12 ENCOUNTER — Inpatient Hospital Stay (HOSPITAL_BASED_OUTPATIENT_CLINIC_OR_DEPARTMENT_OTHER): Payer: Medicare Other | Admitting: Hematology

## 2023-10-12 VITALS — BP 116/77 | HR 64 | Temp 98.1°F | Resp 17 | Wt 189.8 lb

## 2023-10-12 VITALS — BP 120/65 | HR 74

## 2023-10-12 DIAGNOSIS — I129 Hypertensive chronic kidney disease with stage 1 through stage 4 chronic kidney disease, or unspecified chronic kidney disease: Secondary | ICD-10-CM | POA: Diagnosis not present

## 2023-10-12 DIAGNOSIS — C9000 Multiple myeloma not having achieved remission: Secondary | ICD-10-CM | POA: Diagnosis not present

## 2023-10-12 DIAGNOSIS — D801 Nonfamilial hypogammaglobulinemia: Secondary | ICD-10-CM | POA: Diagnosis not present

## 2023-10-12 DIAGNOSIS — Z79810 Long term (current) use of selective estrogen receptor modulators (SERMs): Secondary | ICD-10-CM | POA: Diagnosis not present

## 2023-10-12 DIAGNOSIS — N189 Chronic kidney disease, unspecified: Secondary | ICD-10-CM | POA: Diagnosis not present

## 2023-10-12 DIAGNOSIS — N179 Acute kidney failure, unspecified: Secondary | ICD-10-CM

## 2023-10-12 DIAGNOSIS — C7951 Secondary malignant neoplasm of bone: Secondary | ICD-10-CM

## 2023-10-12 DIAGNOSIS — Z5112 Encounter for antineoplastic immunotherapy: Secondary | ICD-10-CM | POA: Diagnosis not present

## 2023-10-12 DIAGNOSIS — C9002 Multiple myeloma in relapse: Secondary | ICD-10-CM | POA: Diagnosis not present

## 2023-10-12 LAB — IMMUNOFIXATION ELECTROPHORESIS
IgA: 8 mg/dL — ABNORMAL LOW (ref 61–437)
IgG (Immunoglobin G), Serum: 215 mg/dL — ABNORMAL LOW (ref 603–1613)
IgM (Immunoglobulin M), Srm: 10 mg/dL — ABNORMAL LOW (ref 15–143)
Total Protein ELP: 4.7 g/dL — ABNORMAL LOW (ref 6.0–8.5)

## 2023-10-12 MED ORDER — DARATUMUMAB-HYALURONIDASE-FIHJ 1800-30000 MG-UT/15ML ~~LOC~~ SOLN
1800.0000 mg | Freq: Once | SUBCUTANEOUS | Status: AC
  Administered 2023-10-12: 1800 mg via SUBCUTANEOUS
  Filled 2023-10-12: qty 15

## 2023-10-12 MED ORDER — EPOETIN ALFA-EPBX 10000 UNIT/ML IJ SOLN: 20000.0000 [IU] | Freq: Once | INTRAMUSCULAR | Status: DC

## 2023-10-12 MED ORDER — DENOSUMAB 120 MG/1.7ML ~~LOC~~ SOLN: 120.0000 mg | Freq: Once | SUBCUTANEOUS | Status: DC

## 2023-10-12 NOTE — Progress Notes (Signed)
Patient tolerated Darzalex faspro injection with no complaints voiced.  See MAR for details.  Labs reviewed. Injection site clean and dry with no bruising or swelling noted at site.  Band aid applied.  Vss with discharge and left in satisfactory condition with no s/s of distress noted. Per pt he is due to return to clinic tomorrow for Retacrit and Rivka Barbara, pharmacy and MD team made aware.   Gregor Dershem [employer]

## 2023-10-12 NOTE — Patient Instructions (Signed)
[facility]  Discharge Instructions: Thank you for choosing [facility] to provide your oncology and hematology care.  If you have a lab appointment with the Cancer Center - please note that after April 8th, 2024, all labs will be drawn in the cancer center.  You do not have to check in or register with the main entrance as you have in the past but will complete your check-in in the cancer center.  Wear comfortable clothing and clothing appropriate for easy access to any Portacath or PICC line.   We strive to give you quality time with your provider. You may need to reschedule your appointment if you arrive late (15 or more minutes).  Arriving late affects you and other patients whose appointments are after yours.  Also, if you miss three or more appointments without notifying the office, you may be dismissed from the clinic at the provider's discretion.      For prescription refill requests, have your pharmacy contact our office and allow 72 hours for refills to be completed.    Today you received the following chemotherapy and/or immunotherapy agents Darzalex SQ   To help prevent nausea and vomiting after your treatment, we encourage you to take your nausea medication as directed.  BELOW ARE SYMPTOMS THAT SHOULD BE REPORTED IMMEDIATELY: *FEVER GREATER THAN 100.4 F (38 C) OR HIGHER *CHILLS OR SWEATING *NAUSEA AND VOMITING THAT IS NOT CONTROLLED WITH YOUR NAUSEA MEDICATION *UNUSUAL SHORTNESS OF BREATH *UNUSUAL BRUISING OR BLEEDING *URINARY PROBLEMS (pain or burning when urinating, or frequent urination) *BOWEL PROBLEMS (unusual diarrhea, constipation, pain near the anus) TENDERNESS IN MOUTH AND THROAT WITH OR WITHOUT PRESENCE OF ULCERS (sore throat, sores in mouth, or a toothache) UNUSUAL RASH, SWELLING OR PAIN  UNUSUAL VAGINAL DISCHARGE OR ITCHING   Items with * indicate a potential emergency and should be followed up as soon as possible or go to the  Emergency Department if any problems should occur.  Please show the CHEMOTHERAPY ALERT CARD or IMMUNOTHERAPY ALERT CARD at check-in to the Emergency Department and triage nurse.  Should you have questions after your visit or need to cancel or reschedule your appointment, please contact [facility] CENTER AT [facility] [phone]  and follow the prompts.  Office hours are 8:00 a.m. to 4:30 p.m. Monday - Friday. Please note that voicemails left after 4:00 p.m. may not be returned until the following business day.  We are closed weekends and major holidays. You have access to a nurse at all times for urgent questions. Please call the main number to the clinic [phone] and follow the prompts.  For any non-urgent questions, you may also contact your provider using MyChart. We now offer e-Visits for anyone 44 and older to request care online for non-urgent symptoms. For details visit [email or web address].   Also download the MyChart app! Go to the app store, search "MyChart", open the app, select [place], and log in with your MyChart username and password.

## 2023-10-12 NOTE — Progress Notes (Signed)
[facility] [address] [place] [address]    Clinic Day:  10/12/2023  Referring physician: Doreatha Massed, MD  Patient Care Team: Doreatha Massed, MD as PCP - General (Hematology) Doreatha Massed, MD as Consulting Physician (Oncology)   ASSESSMENT & PLAN:   Assessment: 1.  Relapsed multiple myeloma: -Autologous stem cell transplant on 10/08/2017 -Post transplant consolidation with CyCarD for 2 cycles with M spike undetectable. -Maintenance therapy with carfilzomib 70 mg per metered square days 1 and 15 every 28 days from 04/20/2018, dose reduced to 35 mg per metered square on 01/05/2019, titrated up to 56 mg per metered square on 01/09/2020. 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 -PET scan on 02/18/2020 showed new left supraclavicular lymph node at the thoracic inlet, SUV 14.2.  Multifocal osseous lesions largely improved, though residual lesions noted including right acromion, left acromion, thyroid cartilage, right sternum.  Additional lesions throughout the sternum, bilateral ribs, thoracolumbar spine, bilateral pelvis have resolved. -BMBX on 02/14/2020 shows plasma cell myeloma, 60-70% of cells.  FISH for myeloma was negative.  Cytogenetics was normal. -4 cycles of daratumumab, bortezomib and dexamethasone from 02/28/2020 through 04/29/2020. -Daratumumab, pomalidomide and dexamethasone started on 05/28/2020.  He is receiving daratumumab every 2 weeks, Pomalyst 3 mg days 1-21, dexamethasone 20 mg weekly. -Pomalidomide dose reduced to 2 mg days 1-21 around the first week of July 2021, due to severe weakness.    Plan: 1.  Relapsed IgG kappa multiple myeloma: - He is tolerating pomalidomide, Darzalex and dexamethasone reasonably well. - Reviewed labs from 10/05/2023: Creatinine 1.39 and stable.  LFTs are grossly normal.  M spike is stable at 0.1 g.  White count is low at 2.5 with ANC of 1.0.  Platelet count  is normal. - Free light chain ratio is elevated at 1.7.  Kappa light chains of 3.9 and lambda light chains at 2.3. - Recommend continue monthly Darzalex and continue pomalidomide 2 mg days 1 through 21 every 28 days.  Continue dexamethasone on days of Darzalex. - Repeat myeloma labs in 8 weeks.  He has a follow-up at [facility] next Thursday.   2.  Myeloma bone disease: - Continue denosumab monthly.   3.  Macrocytic anemia: - Anemia from CKD, functional iron deficiency and myelosuppression. - Last Monoferric on 05/21/2023.  Hemoglobin is 9.2 today.  He will receive Retacrit.   4.  ID prophylaxis: - Continue acyclovir twice daily.   5.  Peripheral neuropathy: - Continue gabapentin 600 mg 3 times daily.   6.  History of breast cancer: - Continue tamoxifen daily.  7.  Hypogammaglobulinemia: - IgG levels were severely low.  He does not have recurrent infections.  Consider IVIG if he develops recurrent infections.    Orders Placed This Encounter  Procedures   CBC with Differential    Standing Status:   Future    Standing Expiration Date:   12/07/2024   CBC with Differential    Standing Status:   Future    Standing Expiration Date:   01/04/2025       Mikeal Hawthorne R Teague,acting as a scribe for Doreatha Massed, MD.,have documented all relevant documentation on the behalf of Doreatha Massed, MD,as directed by  Doreatha Massed, MD while in the presence of Doreatha Massed, MD.  I, Doreatha Massed MD, have reviewed the above documentation for accuracy and completeness, and I agree with the above.     Doreatha Massed, MD   10/15/20244:56 PM  CHIEF COMPLAINT:   Diagnosis: multiple myeloma    Cancer Staging  Multiple myeloma ([name] Staging form: Plasma Cell Myeloma and Plasma Cell Disorders, AJCC 8th Edition - Clinical stage from 05/03/2017: Beta-2-microglobulin (mg/L): 3.5, Albumin (g/dL): 3.4, ISS: Stage II, High-risk cytogenetics: Absent, LDH:  Not assessed - Signed by Ellouise Newer, PA-C on 05/03/2017    Prior Therapy: 1. RVD x 6 cycles from 03/01/2017 to 06/29/2017. 2. Stem cell transplant on 09/30/2017. 3. Carfilzomib and cyclophosphamide x 2 cycles from 02/14/2018 to 03/29/2018. 4. Carfilzomib x 22 cycles from 04/20/2018 to 01/23/2020.  Current Therapy:  Darzalex Faspro monthly; Pomalyst 2 mg 3/4 weeks; Xgeva monthly    HISTORY OF PRESENT ILLNESS:   Oncology History  Breast cancer, male ([name]  12/31/2009 Initial Biopsy   Biopsy of L breast    12/31/2009 Pathology Results   Invasive ductal carcinoma, ER/PR+, HER 2 negative   12/31/2009 Imaging   Ultrasound showing a 2.43 x 1.85 x 3 cm hypoechoic spiculated mass in the 12 o clock L breast retroareolar region   01/01/2010 -  Anti-estrogen oral therapy   Tamoxifen 20 mg daily   01/06/2010 Imaging   Bone scan abnormal uptake in the diaphysis of the R humerus, abnormal in the R third, fifth and sixth ribs, lesion also noted in the sternum.   02/03/2010 Surgery   Rod placement and fixation of R humerus by Dr. Amanda Pea   02/05/2010 - 02/18/2010 Radiation Therapy   30Gy in 10 fractions of 3 Gy per fraction to R pathologic fracture   03/11/2010 -  Chemotherapy   Denosumab monthly, now every 3 months. Started at [facility]    06/09/2016 Imaging   Three hypermetabolic osseous lesions in the sternum, left ilium and right ilium, as discussed above, likely represent osseous metastases. At this time, these are not recognizable on the CT images. 2. No extra skeletal metastatic disease identified in the neck, chest, abdomen or pelvis.   10/13/2016 Progression   PET shows various new and enlarging osseous metastatic lesions with no definite extra osseous metastatic disease currently identified.    12/31/2016 Progression   1. Multifocal hypermetabolic osseous metastases throughout the axial and proximal appendicular skeleton, which are increased in size, number and metabolism since 10/13/2016  PET-CT. 2. 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 3. No additional sites of hypermetabolic metastatic disease. 4. Chronic right mastoid sinusitis. 5. Aortic atherosclerosis.  One vessel coronary atherosclerosis.   Multiple myeloma ([name]  02/12/2017 Bone Marrow Biopsy   The marrow was variably cellular with large peritrabecular aggregates of kappa restricted plasma cells (66% by aspirate, 30% by Cd138). Cytogenetics +11.    03/01/2017 - 06/29/2017 Chemotherapy   RVD    05/26/2017 Bone Marrow Biopsy   Performed at [facility]:  Plasma cell myeloma in a 30% cellular marrow with decreased trilineage hematopoiesis and 42% kappa light chain restricted plasma cells on the aspirate smears and large aggregates on the core biopsy.     07/12/2017 - 09/01/2017 Chemotherapy   3 cycles of carfizolmib/cyclophosphamide/dexamethasone     10/08/2017 Bone Marrow Transplant   [facility]otransplant [facility]   02/28/2020 - 09/01/2022 Chemotherapy   Patient is on Treatment Plan : Daratumumab q28d     09/01/2022 -  Chemotherapy   Patient is on Treatment Plan : MYELOMA Daratumumab Faspro Subq q28d     Multiple myeloma not having achieved remission ([name]  06/29/2017 Initial Diagnosis   Multiple myeloma not having achieved remission ([name]   02/28/2020 - 09/01/2022 Chemotherapy   Patient is on Treatment Plan : Daratumumab q28d     09/01/2022 -  Chemotherapy   Patient is on Treatment Plan : MYELOMA Daratumumab Faspro Subq q28d        INTERVAL HISTORY:   Johnny Lucas is a 77 y.o. male presenting to clinic today for follow up of multiple myeloma. He was last seen by me on 08/16/23.  Today, he states that he is doing well overall. His appetite level is at 100%. His energy level is at 80%. He was unable to go on a trip due to generalized weakness and an oral virus. He was prescribed a  mouth wash and oral virus has resolved. He denies any new pains or side effects from Pomalyst. He has an appointment on 10/21/23 at [facility].   PAST MEDICAL HISTORY:   Past Medical History: Past Medical History:  Diagnosis Date   Anxiety    Bone metastases 09/10/2016   Breast cancer, male [facility]) 2011   Stage IV breast cancer; radiation and tamoxifen  Overview:  Left breast ca with mets bone  Overview:  METS TO BONE   Bronchitis    uses inhaler   Cough    uses inhaler   GERD (gastroesophageal reflux disease)    Headache    History of blood transfusion    Hypertension    Macular degeneration    bilateral   Memory loss    mild   Multiple myeloma ([name] 02/18/2017   Peripheral neuropathy    feet   Pneumonia    mild case 1-2 yrs ago (2023 or 2024)    Surgical History: Past Surgical History:  Procedure Laterality Date   BACK SURGERY     C2-C3 fusion   BREAST BIOPSY Left    cancer   CATARACT EXTRACTION W/PHACO Left 02/03/2019   Procedure: CATARACT EXTRACTION PHACO AND INTRAOCULAR LENS PLACEMENT (IOC);  Surgeon: Fabio Pierce, MD;  Location: [facility];  Service: Ophthalmology;  Laterality: Left;  CDE: 15.89   CATARACT EXTRACTION W/PHACO Right 07/25/2021   Procedure: CATARACT EXTRACTION PHACO AND INTRAOCULAR LENS PLACEMENT (IOC);  Surgeon: Fabio Pierce, MD;  Location: [facility];  Service: Ophthalmology;  Laterality: Right;  CDE   26.71   COLONOSCOPY     HERNIA REPAIR     PORTACATH PLACEMENT Left 06/10/2018   Procedure: INSERTION PORT-A-CATH;  Surgeon: Lucretia Roers, MD;  Location: [facility];  Service: General;  Laterality: Left;   right arm surgery     bone cancer and rod placed   SINUS ENDO WITH FUSION Bilateral 03/25/2023   Procedure: FUNCTIONAL ENDOSCOPIC SINUS SURGERY WITH FUSION AND NAVIGATION;  Surgeon: [name], MD;  Location: [facility];  Service: ENT;  Laterality: Bilateral;    Social History: Social History   Socioeconomic History   Marital status: Married     Spouse name: Not on file   Number of children: 3   Years of education: Not on file   Highest education level: Not on file  Occupational History   Not on file  Tobacco Use   Smoking status: Never   Smokeless tobacco: Former    Types: Chew  Vaping Use   Vaping status: Never Used  Substance and Sexual Activity   Alcohol use: Yes    Alcohol/week: 24.0 standard drinks of alcohol    Types: 24 Cans of beer per week    Comment: No ETOH in  the last 6 months as of 02/2023   Drug use: No   Sexual activity: Yes  Other Topics Concern   Not on file  Social History Narrative   Not on file   Social Determinants of Health   Financial Resource Strain: Low Risk  (11/26/2020)   Overall Financial Resource Strain (CARDIA)    Difficulty of Paying Living Expenses: Not hard at all  Food Insecurity: Low Risk  (07/12/2023)   Received from [facility]   Hunger Vital Sign    Worried About Running Out of Food in the Last Year: Never true    Ran Out of Food in the Last Year: Never true  Transportation Needs: Not on file (07/12/2023)  Physical Activity: Inactive (11/26/2020)   Exercise Vital Sign    Days of Exercise per Week: 0 days    Minutes of Exercise per Session: 0 min  Stress: Stress Concern Present (11/26/2020)   [employer] of Occupational Health - Occupational Stress Questionnaire    Feeling of Stress : To some extent  Social Connections: Moderately Integrated (11/26/2020)   Social Connection and Isolation Panel [NHANES]    Frequency of Communication with Friends and Family: More than three times a week    Frequency of Social Gatherings with Friends and Family: Three times a week    Attends Religious Services: 1 to 4 times per year    Active Member of Clubs or Organizations: No    Attends Banker Meetings: Never    Marital Status: Married  Catering manager Violence: Not At Risk (11/26/2020)   Humiliation, Afraid, Rape, and Kick questionnaire    Fear of Current or  Ex-Partner: No    Emotionally Abused: No    Physically Abused: No    Sexually Abused: No    Family History: Family History  Problem Relation Age of Onset   Stroke Mother    Cancer Maternal Aunt        cancer NOS; died in her 36s   Lung cancer Maternal Uncle        smoker    Current Medications:  Current Outpatient Medications:    acetaminophen (TYLENOL) 500 MG tablet, Take 1,000 mg by mouth daily., Disp: , Rfl:    acyclovir (ZOVIRAX) 800 MG tablet, TAKE 1 TABLET TWICE DAILY, Disp: 180 tablet, Rfl: 3   albuterol (VENTOLIN HFA) 108 (90 Base) MCG/ACT inhaler, Inhale 2 puffs into the lungs every 6 (six) hours as needed for wheezing or shortness of breath., Disp: , Rfl:    ALPRAZolam (XANAX) 0.5 MG tablet, TAKE 1 TABLET BY MOUTH AT BEDTIME AS NEEDED FOR anxiety / SLEEP, Disp: 30 tablet, Rfl: 5   aspirin EC 81 MG tablet, Take 81 mg by mouth daily. , Disp: , Rfl:    budesonide (PULMICORT) 0.5 MG/2ML nebulizer solution, Add 2 mL (1 vial) of budesonide into 240 mL saline irrigation bottle and irrigate both nostrils morning and night (one bottle total per day)., Disp: , Rfl:    denosumab (XGEVA) 120 MG/1.7ML SOLN injection, Inject 120 mg into the skin every 30 (thirty) days. , Disp: , Rfl:    dexamethasone (DECADRON) 4 MG tablet, TAKE FIVE TABLETS BY MOUTH WEEKLY, Disp: 40 tablet, Rfl: 5   diphenhydrAMINE (BENADRYL) 12.5 MG/5ML elixir, Take 12.5 mg by mouth daily as needed., Disp: , Rfl:    folic acid (FOLVITE) 1 MG tablet, TAKE 1 TABLET BY MOUTH DAILY, Disp: 90 tablet, Rfl: 2   gabapentin (NEURONTIN) 300 MG capsule, TAKE ONE CAPSULE  BY MOUTH THREE TIMES DAILY, Disp: 180 capsule, Rfl: 2   Loperamide HCl (IMODIUM PO), Take 2 mg by mouth daily as needed (diarrhea)., Disp: , Rfl:    loratadine (CLARITIN) 10 MG tablet, Take 1 tablet (10 mg total) by mouth daily., Disp: 90 tablet, Rfl: 6   metoprolol succinate (TOPROL-XL) 100 MG 24 hr tablet, Take 100 mg by mouth in the morning., Disp: , Rfl:     ondansetron (ZOFRAN) 8 MG tablet, TAKE 1 TABLET BY MOUTH EVERY 8 HOURS AS NEEDED FOR NAUSEA AND VOMITING, Disp: 30 tablet, Rfl: 2   pantoprazole (PROTONIX) 40 MG tablet, TAKE 1 TABLET EVERY OTHER DAY, Disp: 45 tablet, Rfl: 3   PERIDEX 0.12 % solution, Use as directed 15 mLs in the mouth or throat 2 (two) times daily., Disp: , Rfl:    polyethylene glycol (MIRALAX / GLYCOLAX) packet, Take 17 g by mouth daily as needed for mild constipation., Disp: 14 each, Rfl: 0   pomalidomide (POMALYST) 2 MG capsule, TAKE 1 CAPSULE BY MOUTH EVERY DAY FOR 21 DAYS ON FOLLOWED BY 7 DAYS OFF, Disp: 21 capsule, Rfl: 0   sildenafil (VIAGRA) 100 MG tablet, 100 mg as needed for erectile dysfunction., Disp: , Rfl:    tamoxifen (NOLVADEX) 20 MG tablet, TAKE 1 TABLET EVERY DAY, Disp: 90 tablet, Rfl: 3   triamcinolone (KENALOG) 0.1 % paste, Use as directed 1 Application in the mouth or throat 3 (three) times daily. as directed, Disp: , Rfl:  No current facility-administered medications for this visit.  Facility-Administered Medications Ordered in Other Visits:    ondansetron (ZOFRAN) 8 mg in sodium chloride 0.9 % 50 mL IVPB, 8 mg, Intravenous, Once, Lockamy, Randi L, NP-C   sodium chloride flush (NS) 0.9 % injection 10 mL, 10 mL, Intracatheter, Once PRN, Doreatha Massed, MD   Allergies: Allergies  Allergen Reactions   Cefepime     Suspected severe thrombocytopenia is a result of cefepime induced antigen platelet destruction    REVIEW OF SYSTEMS:   Review of Systems  Constitutional:  Negative for chills, fatigue and fever.  HENT:   Negative for lump/mass, mouth sores, nosebleeds, sore throat and trouble swallowing.   Eyes:  Negative for eye problems.  Respiratory:  Negative for cough and shortness of breath.   Cardiovascular:  Negative for chest pain, leg swelling and palpitations.  Gastrointestinal:  Negative for abdominal pain, constipation, diarrhea, nausea and vomiting.  Genitourinary:  Negative for bladder  incontinence, difficulty urinating, dysuria, frequency, hematuria and nocturia.   Musculoskeletal:  Negative for arthralgias, back pain, flank pain, myalgias and neck pain.       +mouth pain, 4/10 severity  Skin:  Negative for itching and rash.  Neurological:  Positive for dizziness and headaches. Negative for numbness.       +tingling feet  Hematological:  Does not bruise/bleed easily.  Psychiatric/Behavioral:  Negative for depression, sleep disturbance and suicidal ideas. The patient is not nervous/anxious.   All other systems reviewed and are negative.    VITALS:   Blood pressure 116/77, pulse 64, temperature 98.1 F (36.7 C), temperature source Oral, resp. rate 17, weight 189 lb 12.8 oz (86.1 kg), SpO2 100%.  Wt Readings from Last 3 Encounters:  10/12/23 189 lb 12.8 oz (86.1 kg)  09/14/23 184 lb 9.6 oz (83.7 kg)  09/10/23 184 lb 14.4 oz (83.9 kg)    Body mass index is 28.03 kg/m.  Performance status (ECOG): 1 - Symptomatic but completely ambulatory  PHYSICAL EXAM:   Physical  Exam Vitals and nursing note reviewed. Exam conducted with a chaperone present.  Constitutional:      Appearance: Normal appearance.  Cardiovascular:     Rate and Rhythm: Normal rate and regular rhythm.     Pulses: Normal pulses.     Heart sounds: Normal heart sounds.  Pulmonary:     Effort: Pulmonary effort is normal.     Breath sounds: Normal breath sounds.  Abdominal:     Palpations: Abdomen is soft. There is no hepatomegaly, splenomegaly or mass.     Tenderness: There is no abdominal tenderness.  Musculoskeletal:     Right lower leg: No edema.     Left lower leg: No edema.  Lymphadenopathy:     Cervical: No cervical adenopathy.     Right cervical: No superficial, deep or posterior cervical adenopathy.    Left cervical: No superficial, deep or posterior cervical adenopathy.     Upper Body:     Right upper body: No supraclavicular or axillary adenopathy.     Left upper body: No  supraclavicular or axillary adenopathy.  Neurological:     General: No focal deficit present.     Mental Status: He is alert and oriented to person, place, and time.  Psychiatric:        Mood and Affect: Mood normal.        Behavior: Behavior normal.     LABS:      Latest Ref Rng & Units 10/05/2023   11:55 AM 09/10/2023   11:30 AM 08/11/2023   10:15 AM  CBC  WBC 4.0 - 10.5 K/uL 2.8  3.8  2.8   Hemoglobin 13.0 - 17.0 g/dL 9.2  9.6  30.8   Hematocrit 39.0 - 52.0 % 27.9  29.0  31.0   Platelets 150 - 400 K/uL 161  188  126       Latest Ref Rng & Units 10/05/2023   11:55 AM 09/10/2023   11:30 AM 08/11/2023   10:15 AM  CMP  Glucose 70 - 99 mg/dL 91  657  95   BUN 8 - 23 mg/dL 15  14  15    Creatinine 0.61 - 1.24 mg/dL 8.46  9.62  9.52   Sodium 135 - 145 mmol/L 133  133  135   Potassium 3.5 - 5.1 mmol/L 4.0  4.2  4.1   Chloride 98 - 111 mmol/L 102  104  107   CO2 22 - 32 mmol/L 23  22  24    Calcium 8.9 - 10.3 mg/dL 8.5  7.8  7.7   Total Protein 6.5 - 8.1 g/dL 5.3  5.3  5.2   Total Bilirubin 0.3 - 1.2 mg/dL 0.8  0.5  0.4   Alkaline Phos 38 - 126 U/L 30  34  31   AST 15 - 41 U/L 10  18  12    ALT 0 - 44 U/L 12  24  12       No results found for: "CEA1", "CEA" / No results found for: "CEA1", "CEA" No results found for: "PSA1" No results found for: "WUX324" No results found for: "CAN125"  Lab Results  Component Value Date   TOTALPROTELP 4.7 (L) 10/05/2023   TOTALPROTELP 4.8 (L) 10/05/2023   ALBUMINELP 3.0 10/05/2023   A1GS 0.3 10/05/2023   A2GS 0.7 10/05/2023   BETS 0.7 10/05/2023   GAMS 0.1 (L) 10/05/2023   MSPIKE 0.1 (H) 10/05/2023   SPEI Comment 10/05/2023   Lab Results  Component Value Date  TIBC 314 03/02/2023   TIBC 308 08/25/2022   TIBC 325 03/25/2020   FERRITIN 161 03/02/2023   FERRITIN 170 08/25/2022   FERRITIN 640 (H) 03/25/2020   IRONPCTSAT 11 (L) 03/02/2023   IRONPCTSAT 24 08/25/2022   IRONPCTSAT 10 (L) 03/25/2020   Lab Results  Component Value Date    LDH 131 11/18/2021   LDH 127 09/30/2021   LDH 102 08/05/2021     STUDIES:   No results found.

## 2023-10-12 NOTE — Patient Instructions (Signed)
[facility]  Discharge Instructions  You were seen and examined today by Dr. Ellin Saba.  Dr. Ellin Saba discussed your most recent lab work which is stable. Proceed with treatment today. You will get Retacrit, a red blood cell booster today as well.  Continue Dara and Xgeva injections as you have been. Continue Pomalyst as well.  Follow-up as scheduled.  Thank you for choosing  [facility] to provide your oncology and hematology care.   To afford each patient quality time with our provider, please arrive at least 15 minutes before your scheduled appointment time. You may need to reschedule your appointment if you arrive late (10 or more minutes). Arriving late affects you and other patients whose appointments are after yours.  Also, if you miss three or more appointments without notifying the office, you may be dismissed from the clinic at the provider's discretion.    Again, thank you for choosing [facility].  Our hope is that these requests will decrease the amount of time that you wait before being seen by our physicians.   If you have a lab appointment with the Cancer Center - please note that after April 8th, all labs will be drawn in the cancer center.  You do not have to check in or register with the main entrance as you have in the past but will complete your check-in at the cancer center.            _____________________________________________________________  Should you have questions after your visit to [facility], please contact our office at [phone] and follow the prompts.  Our office hours are 8:00 a.m. to 4:30 p.m. Monday - Thursday and 8:00 a.m. to 2:30 p.m. Friday.  Please note that voicemails left after 4:00 p.m. may not be returned until the following business day.  We are closed weekends and all major holidays.  You do have access to a nurse 24-7, just call the main number to the clinic  [phone] and do not press any options, hold on the line and a nurse will answer the phone.    For prescription refill requests, have your pharmacy contact our office and allow 72 hours.    Masks are no longer required in the cancer centers. If you would like for your care team to wear a mask while they are taking care of you, please let them know. You may have one support person who is at least 75 years old accompany you for your appointments.

## 2023-10-12 NOTE — Progress Notes (Signed)
Patient is taking Pomalyst as prescribed. He has not missed any doses and reports no side effects at this time.   Patient has been assessed, vital signs and labs have been reviewed by Dr. Ellin Saba. ANC, Creatinine, LFTs, and Platelets are within treatment parameters per Dr. Ellin Saba. The patient is good to proceed with treatment at this time in addition to a Retacrit injection. Primary RN and pharmacy aware.

## 2023-10-12 NOTE — Progress Notes (Signed)
Patient took premeds from home.

## 2023-10-13 ENCOUNTER — Inpatient Hospital Stay: Payer: Medicare Other

## 2023-10-13 ENCOUNTER — Ambulatory Visit: Payer: Medicare Other | Admitting: Hematology

## 2023-10-13 ENCOUNTER — Other Ambulatory Visit: Payer: Self-pay

## 2023-10-13 ENCOUNTER — Ambulatory Visit: Payer: Medicare Other

## 2023-10-13 VITALS — BP 123/55 | HR 58 | Temp 98.1°F | Resp 18

## 2023-10-13 DIAGNOSIS — Z5112 Encounter for antineoplastic immunotherapy: Secondary | ICD-10-CM | POA: Diagnosis not present

## 2023-10-13 DIAGNOSIS — D801 Nonfamilial hypogammaglobulinemia: Secondary | ICD-10-CM | POA: Diagnosis not present

## 2023-10-13 DIAGNOSIS — I129 Hypertensive chronic kidney disease with stage 1 through stage 4 chronic kidney disease, or unspecified chronic kidney disease: Secondary | ICD-10-CM | POA: Diagnosis not present

## 2023-10-13 DIAGNOSIS — N179 Acute kidney failure, unspecified: Secondary | ICD-10-CM

## 2023-10-13 DIAGNOSIS — Z79810 Long term (current) use of selective estrogen receptor modulators (SERMs): Secondary | ICD-10-CM | POA: Diagnosis not present

## 2023-10-13 DIAGNOSIS — N189 Chronic kidney disease, unspecified: Secondary | ICD-10-CM | POA: Diagnosis not present

## 2023-10-13 DIAGNOSIS — C9002 Multiple myeloma in relapse: Secondary | ICD-10-CM | POA: Diagnosis not present

## 2023-10-13 DIAGNOSIS — C9000 Multiple myeloma not having achieved remission: Secondary | ICD-10-CM

## 2023-10-13 DIAGNOSIS — C7951 Secondary malignant neoplasm of bone: Secondary | ICD-10-CM

## 2023-10-13 MED ORDER — EPOETIN ALFA-EPBX 20000 UNIT/ML IJ SOLN
20000.0000 [IU] | Freq: Once | INTRAMUSCULAR | Status: AC
  Administered 2023-10-13: 20000 [IU] via SUBCUTANEOUS
  Filled 2023-10-13: qty 1

## 2023-10-13 MED ORDER — DENOSUMAB 120 MG/1.7ML ~~LOC~~ SOLN
120.0000 mg | Freq: Once | SUBCUTANEOUS | Status: AC
  Administered 2023-10-13: 120 mg via SUBCUTANEOUS
  Filled 2023-10-13: qty 1.7

## 2023-10-13 NOTE — Progress Notes (Signed)
Patient presents today for Xgeva and Retacrit injections.  Patient is in satisfactory condition with no new complaints voiced. Vital signs are stable.  Calcium on 10/05/23 was 8.5 and hemoglobin was 9.2.  Patient is taking calcium and vitamin D supplements daily.  We will proceed with injection per provider orders.    Patient tolerated injections with no complaints voiced.  Site clean and dry with no bruising or swelling noted.  No complaints of pain.  Discharged with vital signs stable and no signs or symptoms of distress noted.

## 2023-10-13 NOTE — Patient Instructions (Signed)
[facility]  Discharge Instructions: Thank you for choosing [facility] to provide your oncology and hematology care.  If you have a lab appointment with the Cancer Center - please note that after April 8th, 2024, all labs will be drawn in the cancer center.  You do not have to check in or register with the main entrance as you have in the past but will complete your check-in in the cancer center.  Wear comfortable clothing and clothing appropriate for easy access to any Portacath or PICC line.   We strive to give you quality time with your provider. You may need to reschedule your appointment if you arrive late (15 or more minutes).  Arriving late affects you and other patients whose appointments are after yours.  Also, if you miss three or more appointments without notifying the office, you may be dismissed from the clinic at the provider's discretion.      For prescription refill requests, have your pharmacy contact our office and allow 72 hours for refills to be completed.    Today you received the following: Retacrit and Xgeva.  Epoetin Alfa Injection What is this medication? EPOETIN ALFA (e POE e tin AL fa) treats low levels of red blood cells (anemia) caused by kidney disease, chemotherapy, or HIV medications. It can also be used in people who are at risk for blood loss during surgery. It works by Systems analyst make more red blood cells, which reduces the need for blood transfusions. This medicine may be used for other purposes; ask your health care provider or pharmacist if you have questions. COMMON BRAND NAME(S): Epogen, Procrit, Retacrit What should I tell my care team before I take this medication? They need to know if you have any of these conditions: Blood clots Cancer Heart disease High blood pressure On dialysis Seizures Stroke An unusual or allergic reaction to epoetin alfa, albumin, benzyl alcohol, other medications, foods, dyes, or  preservatives Pregnant or trying to get pregnant Breast-feeding How should I use this medication? This medication is injected into a vein or under the skin. It is usually given by your care team in a hospital or clinic setting. It may also be given at home. If you get this medication at home, you will be taught how to prepare and give it. Use exactly as directed. Take it as directed on the prescription label at the same time every day. Keep taking it unless your care team tells you to stop. It is important that you put your used needles and syringes in a special sharps container. Do not put them in a trash can. If you do not have a sharps container, call your pharmacist or care team to get one. A special MedGuide will be given to you by the pharmacist with each prescription and refill. Be sure to read this information carefully each time. Talk to your care team about the use of this medication in children. While this medication may be used in children as young as 1 month of age for selected conditions, precautions do apply. Overdosage: If you think you have taken too much of this medicine contact a poison control center or emergency room at once. NOTE: This medicine is only for you. Do not share this medicine with others. What if I miss a dose? If you miss a dose, take it as soon as you can. If it is almost time for your next dose, take only that dose. Do not take  double or extra doses. What may interact with this medication? Darbepoetin alfa Methoxy polyethylene glycol-epoetin beta This list may not describe all possible interactions. Give your health care provider a list of all the medicines, herbs, non-prescription drugs, or dietary supplements you use. Also tell them if you smoke, drink alcohol, or use illegal drugs. Some items may interact with your medicine. What should I watch for while using this medication? Visit your care team for regular checks on your progress. Check your blood pressure  as directed. Know what your blood pressure should be and when to contact your care team. Your condition will be monitored carefully while you are receiving this medication. You may need blood work while taking this medication. What side effects may I notice from receiving this medication? Side effects that you should report to your care team as soon as possible: Allergic reactions--skin rash, itching, hives, swelling of the face, lips, tongue, or throat Blood clot--pain, swelling, or warmth in the leg, shortness of breath, chest pain Heart attack--pain or tightness in the chest, shoulders, arms, or jaw, nausea, shortness of breath, cold or clammy skin, feeling faint or lightheaded Increase in blood pressure Rash, fever, and swollen lymph nodes Redness, blistering, peeling, or loosening of the skin, including inside the mouth Seizures Stroke--sudden numbness or weakness of the face, arm, or leg, trouble speaking, confusion, trouble walking, loss of balance or coordination, dizziness, severe headache, change in vision Side effects that usually do not require medical attention (report to your care team if they continue or are bothersome): Bone, joint, or muscle pain Cough Headache Nausea Pain, redness, or irritation at injection site This list may not describe all possible side effects. Call your doctor for medical advice about side effects. You may report side effects to FDA at 1-800-FDA-1088. Where should I keep my medication? Keep out of the reach of children and pets. Store in a refrigerator. Do not freeze. Do not shake. Protect from light. Keep this medication in the original container until you are ready to take it. See product for storage information. Get rid of any unused medication after the expiration date. To get rid of medications that are no longer needed or have expired: Take the medication to a medication take-back program. Check with your pharmacy or law enforcement to find a  location. If you cannot return the medication, ask your pharmacist or care team how to get rid of the medication safely. NOTE: This sheet is a summary. It may not cover all possible information. If you have questions about this medicine, talk to your doctor, pharmacist, or health care provider.  2024 Elsevier/Gold Standard (2022-04-17 00:00:00)    Denosumab Injection (Oncology) What is this medication? DENOSUMAB (den oh SUE mab) prevents weakened bones caused by cancer. It may also be used to treat noncancerous bone tumors that cannot be removed by surgery. It can also be used to treat high calcium levels in the blood caused by cancer. It works by blocking a protein that causes bones to break down quickly. This slows down the release of calcium from bones, which lowers calcium levels in your blood. It also makes your bones stronger and less likely to break (fracture). This medicine may be used for other purposes; ask your health care provider or pharmacist if you have questions. COMMON BRAND NAME(S): XGEVA What should I tell my care team before I take this medication? They need to know if you have any of these conditions: Dental disease Having surgery or tooth extraction Infection  Kidney disease Low levels of calcium or vitamin D in the blood Malnutrition On hemodialysis Skin conditions or sensitivity Thyroid or parathyroid disease An unusual reaction to denosumab, other medications, foods, dyes, or preservatives Pregnant or trying to get pregnant Breast-feeding How should I use this medication? This medication is for injection under the skin. It is given by your care team in a hospital or clinic setting. A special MedGuide will be given to you before each treatment. Be sure to read this information carefully each time. Talk to your care team about the use of this medication in children. While it may be prescribed for children as young as 13 years for selected conditions, precautions do  apply. Overdosage: If you think you have taken too much of this medicine contact a poison control center or emergency room at once. NOTE: This medicine is only for you. Do not share this medicine with others. What if I miss a dose? Keep appointments for follow-up doses. It is important not to miss your dose. Call your care team if you are unable to keep an appointment. What may interact with this medication? Do not take this medication with any of the following: Other medications containing denosumab This medication may also interact with the following: Medications that lower your chance of fighting infection Steroid medications, such as prednisone or cortisone This list may not describe all possible interactions. Give your health care provider a list of all the medicines, herbs, non-prescription drugs, or dietary supplements you use. Also tell them if you smoke, drink alcohol, or use illegal drugs. Some items may interact with your medicine. What should I watch for while using this medication? Your condition will be monitored carefully while you are receiving this medication. You may need blood work while taking this medication. This medication may increase your risk of getting an infection. Call your care team for advice if you get a fever, chills, sore throat, or other symptoms of a cold or flu. Do not treat yourself. Try to avoid being around people who are sick. You should make sure you get enough calcium and vitamin D while you are taking this medication, unless your care team tells you not to. Discuss the foods you eat and the vitamins you take with your care team. Some people who take this medication have severe bone, joint, or muscle pain. This medication may also increase your risk for jaw problems or a broken thigh bone. Tell your care team right away if you have severe pain in your jaw, bones, joints, or muscles. Tell your care team if you have any pain that does not go away or that gets  worse. Talk to your care team if you may be pregnant. Serious birth defects can occur if you take this medication during pregnancy and for 5 months after the last dose. You will need a negative pregnancy test before starting this medication. Contraception is recommended while taking this medication and for 5 months after the last dose. Your care team can help you find the option that works for you. What side effects may I notice from receiving this medication? Side effects that you should report to your care team as soon as possible: Allergic reactions--skin rash, itching, hives, swelling of the face, lips, tongue, or throat Bone, joint, or muscle pain Low calcium level--muscle pain or cramps, confusion, tingling, or numbness in the hands or feet Osteonecrosis of the jaw--pain, swelling, or redness in the mouth, numbness of the jaw, poor healing after dental work,  unusual discharge from the mouth, visible bones in the mouth Side effects that usually do not require medical attention (report to your care team if they continue or are bothersome): Cough Diarrhea Fatigue Headache Nausea This list may not describe all possible side effects. Call your doctor for medical advice about side effects. You may report side effects to FDA at 1-800-FDA-1088. Where should I keep my medication? This medication is given in a hospital or clinic. It will not be stored at home. NOTE: This sheet is a summary. It may not cover all possible information. If you have questions about this medicine, talk to your doctor, pharmacist, or health care provider.  2024 Elsevier/Gold Standard (2022-05-06 00:00:00)     To help prevent nausea and vomiting after your treatment, we encourage you to take your nausea medication as directed.  BELOW ARE SYMPTOMS THAT SHOULD BE REPORTED IMMEDIATELY: *FEVER GREATER THAN 100.4 F (38 C) OR HIGHER *CHILLS OR SWEATING *NAUSEA AND VOMITING THAT IS NOT CONTROLLED WITH YOUR NAUSEA  MEDICATION *UNUSUAL SHORTNESS OF BREATH *UNUSUAL BRUISING OR BLEEDING *URINARY PROBLEMS (pain or burning when urinating, or frequent urination) *BOWEL PROBLEMS (unusual diarrhea, constipation, pain near the anus) TENDERNESS IN MOUTH AND THROAT WITH OR WITHOUT PRESENCE OF ULCERS (sore throat, sores in mouth, or a toothache) UNUSUAL RASH, SWELLING OR PAIN  UNUSUAL VAGINAL DISCHARGE OR ITCHING   Items with * indicate a potential emergency and should be followed up as soon as possible or go to the Emergency Department if any problems should occur.  Please show the CHEMOTHERAPY ALERT CARD or IMMUNOTHERAPY ALERT CARD at check-in to the Emergency Department and triage nurse.  Should you have questions after your visit or need to cancel or reschedule your appointment, please contact [facility] AT [facility] [phone]  and follow the prompts.  Office hours are 8:00 a.m. to 4:30 p.m. Monday - Friday. Please note that voicemails left after 4:00 p.m. may not be returned until the following business day.  We are closed weekends and major holidays. You have access to a nurse at all times for urgent questions. Please call the main number to the clinic [phone] and follow the prompts.  For any non-urgent questions, you may also contact your provider using MyChart. We now offer e-Visits for anyone 73 and older to request care online for non-urgent symptoms. For details visit [email or web address].   Also download the MyChart app! Go to the app store, search "MyChart", open the app, select [place], and log in with your MyChart username and password.

## 2023-10-15 ENCOUNTER — Other Ambulatory Visit: Payer: Self-pay

## 2023-10-15 DIAGNOSIS — G4700 Insomnia, unspecified: Secondary | ICD-10-CM

## 2023-10-15 DIAGNOSIS — I10 Essential (primary) hypertension: Secondary | ICD-10-CM

## 2023-10-15 MED ORDER — POMALIDOMIDE 2 MG PO CAPS: ORAL_CAPSULE | ORAL | 0 refills | Status: DC

## 2023-10-15 NOTE — Telephone Encounter (Signed)
Chart reviewed. Pomalyst refilled per last office note with Dr. Katragadda.  

## 2023-10-20 DIAGNOSIS — J329 Chronic sinusitis, unspecified: Secondary | ICD-10-CM | POA: Diagnosis not present

## 2023-10-20 DIAGNOSIS — Z6827 Body mass index (BMI) 27.0-27.9, adult: Secondary | ICD-10-CM | POA: Diagnosis not present

## 2023-10-21 DIAGNOSIS — C9000 Multiple myeloma not having achieved remission: Secondary | ICD-10-CM | POA: Diagnosis not present

## 2023-10-21 DIAGNOSIS — N183 Chronic kidney disease, stage 3 unspecified: Secondary | ICD-10-CM | POA: Diagnosis not present

## 2023-10-21 DIAGNOSIS — D539 Nutritional anemia, unspecified: Secondary | ICD-10-CM | POA: Diagnosis not present

## 2023-10-21 DIAGNOSIS — C50929 Malignant neoplasm of unspecified site of unspecified male breast: Secondary | ICD-10-CM | POA: Diagnosis not present

## 2023-10-21 DIAGNOSIS — E538 Deficiency of other specified B group vitamins: Secondary | ICD-10-CM | POA: Diagnosis not present

## 2023-10-21 DIAGNOSIS — M8588 Other specified disorders of bone density and structure, other site: Secondary | ICD-10-CM | POA: Diagnosis not present

## 2023-10-21 DIAGNOSIS — Z9484 Stem cells transplant status: Secondary | ICD-10-CM | POA: Diagnosis not present

## 2023-10-21 DIAGNOSIS — G629 Polyneuropathy, unspecified: Secondary | ICD-10-CM | POA: Diagnosis not present

## 2023-10-27 ENCOUNTER — Other Ambulatory Visit: Payer: Self-pay | Admitting: Hematology

## 2023-10-27 DIAGNOSIS — C9000 Multiple myeloma not having achieved remission: Secondary | ICD-10-CM

## 2023-11-03 ENCOUNTER — Inpatient Hospital Stay: Payer: Medicare Other

## 2023-11-09 ENCOUNTER — Inpatient Hospital Stay: Payer: Medicare Other

## 2023-11-09 ENCOUNTER — Inpatient Hospital Stay: Payer: Medicare Other | Attending: Hematology

## 2023-11-09 ENCOUNTER — Inpatient Hospital Stay: Payer: Medicare Other | Admitting: Hematology

## 2023-11-09 ENCOUNTER — Other Ambulatory Visit: Payer: Self-pay

## 2023-11-09 DIAGNOSIS — D631 Anemia in chronic kidney disease: Secondary | ICD-10-CM | POA: Insufficient documentation

## 2023-11-09 DIAGNOSIS — C9000 Multiple myeloma not having achieved remission: Secondary | ICD-10-CM

## 2023-11-09 DIAGNOSIS — Z79899 Other long term (current) drug therapy: Secondary | ICD-10-CM | POA: Diagnosis not present

## 2023-11-09 DIAGNOSIS — N183 Chronic kidney disease, stage 3 unspecified: Secondary | ICD-10-CM | POA: Insufficient documentation

## 2023-11-09 DIAGNOSIS — I129 Hypertensive chronic kidney disease with stage 1 through stage 4 chronic kidney disease, or unspecified chronic kidney disease: Secondary | ICD-10-CM | POA: Insufficient documentation

## 2023-11-09 DIAGNOSIS — Z5112 Encounter for antineoplastic immunotherapy: Secondary | ICD-10-CM | POA: Insufficient documentation

## 2023-11-09 DIAGNOSIS — C7951 Secondary malignant neoplasm of bone: Secondary | ICD-10-CM

## 2023-11-09 DIAGNOSIS — G4700 Insomnia, unspecified: Secondary | ICD-10-CM

## 2023-11-09 DIAGNOSIS — I10 Essential (primary) hypertension: Secondary | ICD-10-CM

## 2023-11-09 LAB — COMPREHENSIVE METABOLIC PANEL
ALT: 10 U/L (ref 0–44)
AST: 11 U/L — ABNORMAL LOW (ref 15–41)
Albumin: 3.1 g/dL — ABNORMAL LOW (ref 3.5–5.0)
Alkaline Phosphatase: 27 U/L — ABNORMAL LOW (ref 38–126)
Anion gap: 5 (ref 5–15)
BUN: 18 mg/dL (ref 8–23)
CO2: 24 mmol/L (ref 22–32)
Calcium: 8.3 mg/dL — ABNORMAL LOW (ref 8.9–10.3)
Chloride: 106 mmol/L (ref 98–111)
Creatinine, Ser: 1.43 mg/dL — ABNORMAL HIGH (ref 0.61–1.24)
GFR, Estimated: 51 mL/min — ABNORMAL LOW (ref >60)
Glucose, Bld: 95 mg/dL (ref 70–99)
Potassium: 4.4 mmol/L (ref 3.5–5.1)
Sodium: 135 mmol/L (ref 135–145)
Total Bilirubin: 0.3 mg/dL (ref <1.2)
Total Protein: 5.2 g/dL — ABNORMAL LOW (ref 6.5–8.1)

## 2023-11-09 LAB — CBC WITH DIFFERENTIAL/PLATELET
Abs Immature Granulocytes: 0.01 10*3/uL (ref 0.00–0.07)
Basophils Absolute: 0.1 10*3/uL (ref 0.0–0.1)
Basophils Relative: 2 %
Eosinophils Absolute: 0.3 10*3/uL (ref 0.0–0.5)
Eosinophils Relative: 5 %
HCT: 27.6 % — ABNORMAL LOW (ref 39.0–52.0)
Hemoglobin: 9 g/dL — ABNORMAL LOW (ref 13.0–17.0)
Immature Granulocytes: 0 %
Lymphocytes Relative: 27 %
Lymphs Abs: 1.4 10*3/uL (ref 0.7–4.0)
MCH: 35.4 pg — ABNORMAL HIGH (ref 26.0–34.0)
MCHC: 32.6 g/dL (ref 30.0–36.0)
MCV: 108.7 fL — ABNORMAL HIGH (ref 80.0–100.0)
Monocytes Absolute: 1 10*3/uL (ref 0.1–1.0)
Monocytes Relative: 19 %
Neutro Abs: 2.5 10*3/uL (ref 1.7–7.7)
Neutrophils Relative %: 47 %
Platelets: 175 10*3/uL (ref 150–400)
RBC: 2.54 MIL/uL — ABNORMAL LOW (ref 4.22–5.81)
RDW: 15.6 % — ABNORMAL HIGH (ref 11.5–15.5)
WBC: 5.3 10*3/uL (ref 4.0–10.5)
nRBC: 0 % (ref 0.0–0.2)

## 2023-11-09 LAB — MAGNESIUM: Magnesium: 1.7 mg/dL (ref 1.7–2.4)

## 2023-11-09 MED ORDER — HEPARIN SOD (PORK) LOCK FLUSH 100 UNIT/ML IV SOLN
500.0000 [IU] | Freq: Once | INTRAVENOUS | Status: AC
  Administered 2023-11-09: 500 [IU] via INTRAVENOUS

## 2023-11-09 MED ORDER — SODIUM CHLORIDE 0.9% FLUSH
10.0000 mL | Freq: Once | INTRAVENOUS | Status: AC
  Administered 2023-11-09: 10 mL via INTRAVENOUS

## 2023-11-09 MED ORDER — DARATUMUMAB-HYALURONIDASE-FIHJ 1800-30000 MG-UT/15ML ~~LOC~~ SOLN
1800.0000 mg | Freq: Once | SUBCUTANEOUS | Status: AC
  Administered 2023-11-09: 1800 mg via SUBCUTANEOUS
  Filled 2023-11-09: qty 15

## 2023-11-09 MED ORDER — POMALIDOMIDE 2 MG PO CAPS: ORAL_CAPSULE | ORAL | 0 refills | Status: DC

## 2023-11-09 NOTE — Telephone Encounter (Signed)
Chart reviewed. Pomalyst refilled per last office note with Dr. Katragadda.  

## 2023-11-09 NOTE — Patient Instructions (Signed)
[place] [facility] - A DEPT OF [facility]  Discharge Instructions: Thank you for choosing [facility] to provide your oncology and hematology care.  If you have a lab appointment with the Cancer Center - please note that after April 8th, 2024, all labs will be drawn in the cancer center.  You do not have to check in or register with the main entrance as you have in the past but will complete your check-in in the cancer center.  Wear comfortable clothing and clothing appropriate for easy access to any Portacath or PICC line.   We strive to give you quality time with your provider. You may need to reschedule your appointment if you arrive late (15 or more minutes).  Arriving late affects you and other patients whose appointments are after yours.  Also, if you miss three or more appointments without notifying the office, you may be dismissed from the clinic at the provider's discretion.      For prescription refill requests, have your pharmacy contact our office and allow 72 hours for refills to be completed.    Today you received the following chemotherapy and/or immunotherapy agents Daratumumab      To help prevent nausea and vomiting after your treatment, we encourage you to take your nausea medication as directed.  BELOW ARE SYMPTOMS THAT SHOULD BE REPORTED IMMEDIATELY: *FEVER GREATER THAN 100.4 F (38 C) OR HIGHER *CHILLS OR SWEATING *NAUSEA AND VOMITING THAT IS NOT CONTROLLED WITH YOUR NAUSEA MEDICATION *UNUSUAL SHORTNESS OF BREATH *UNUSUAL BRUISING OR BLEEDING *URINARY PROBLEMS (pain or burning when urinating, or frequent urination) *BOWEL PROBLEMS (unusual diarrhea, constipation, pain near the anus) TENDERNESS IN MOUTH AND THROAT WITH OR WITHOUT PRESENCE OF ULCERS (sore throat, sores in mouth, or a toothache) UNUSUAL RASH, SWELLING OR PAIN  UNUSUAL VAGINAL DISCHARGE OR ITCHING   Items with * indicate a potential emergency and should be followed  up as soon as possible or go to the Emergency Department if any problems should occur.  Please show the CHEMOTHERAPY ALERT CARD or IMMUNOTHERAPY ALERT CARD at check-in to the Emergency Department and triage nurse.  Should you have questions after your visit or need to cancel or reschedule your appointment, please contact [place] [facility] - A DEPT OF [facility] [phone]  and follow the prompts.  Office hours are 8:00 a.m. to 4:30 p.m. Monday - Friday. Please note that voicemails left after 4:00 p.m. may not be returned until the following business day.  We are closed weekends and major holidays. You have access to a nurse at all times for urgent questions. Please call the main number to the clinic [phone] and follow the prompts.  For any non-urgent questions, you may also contact your provider using MyChart. We now offer e-Visits for anyone 42 and older to request care online for non-urgent symptoms. For details visit [email or web address].   Also download the MyChart app! Go to the app store, search "MyChart", open the app, select [place], and log in with your MyChart username and password.

## 2023-11-09 NOTE — Progress Notes (Signed)
Patient presents today for Daratumumab injection per providers order.  Vital signs and labs within parameters for treatment.  Patient has no new complaints at this time.  Stable during administration without incident; injection site WNL; see MAR for injection details.  Patient tolerated procedure well and without incident.  No questions or complaints noted at this time.

## 2023-11-10 ENCOUNTER — Inpatient Hospital Stay: Payer: Medicare Other

## 2023-11-10 VITALS — BP 141/56 | HR 55 | Temp 98.2°F | Resp 17

## 2023-11-10 DIAGNOSIS — Z5112 Encounter for antineoplastic immunotherapy: Secondary | ICD-10-CM | POA: Diagnosis not present

## 2023-11-10 DIAGNOSIS — C9000 Multiple myeloma not having achieved remission: Secondary | ICD-10-CM | POA: Diagnosis not present

## 2023-11-10 DIAGNOSIS — D631 Anemia in chronic kidney disease: Secondary | ICD-10-CM | POA: Diagnosis not present

## 2023-11-10 DIAGNOSIS — N183 Chronic kidney disease, stage 3 unspecified: Secondary | ICD-10-CM | POA: Diagnosis not present

## 2023-11-10 DIAGNOSIS — C7951 Secondary malignant neoplasm of bone: Secondary | ICD-10-CM

## 2023-11-10 DIAGNOSIS — I129 Hypertensive chronic kidney disease with stage 1 through stage 4 chronic kidney disease, or unspecified chronic kidney disease: Secondary | ICD-10-CM | POA: Diagnosis not present

## 2023-11-10 DIAGNOSIS — N179 Acute kidney failure, unspecified: Secondary | ICD-10-CM

## 2023-11-10 DIAGNOSIS — Z79899 Other long term (current) drug therapy: Secondary | ICD-10-CM | POA: Diagnosis not present

## 2023-11-10 MED ORDER — DENOSUMAB 120 MG/1.7ML ~~LOC~~ SOLN
120.0000 mg | Freq: Once | SUBCUTANEOUS | Status: AC
  Administered 2023-11-10: 120 mg via SUBCUTANEOUS
  Filled 2023-11-10: qty 1.7

## 2023-11-10 MED ORDER — EPOETIN ALFA-EPBX 20000 UNIT/ML IJ SOLN
20000.0000 [IU] | Freq: Once | INTRAMUSCULAR | Status: AC
Start: 2023-11-10 — End: 2023-11-10
  Administered 2023-11-10: 20000 [IU] via SUBCUTANEOUS
  Filled 2023-11-10: qty 1

## 2023-11-10 NOTE — Patient Instructions (Signed)
Epoetin Alfa Injection What is this medication? EPOETIN ALFA (e POE e tin AL fa) treats low levels of red blood cells (anemia) caused by kidney disease, chemotherapy, or HIV medications. It can also be used in people who are at risk for blood loss during surgery. It works by Systems analyst make more red blood cells, which reduces the need for blood transfusions. This medicine may be used for other purposes; ask your health care provider or pharmacist if you have questions. COMMON BRAND NAME(S): Epogen, Procrit, Retacrit What should I tell my care team before I take this medication? They need to know if you have any of these conditions: Blood clots Cancer Heart disease High blood pressure On dialysis Seizures Stroke An unusual or allergic reaction to epoetin alfa, albumin, benzyl alcohol, other medications, foods, dyes, or preservatives Pregnant or trying to get pregnant Breast-feeding How should I use this medication? This medication is injected into a vein or under the skin. It is usually given by your care team in a hospital or clinic setting. It may also be given at home. If you get this medication at home, you will be taught how to prepare and give it. Use exactly as directed. Take it as directed on the prescription label at the same time every day. Keep taking it unless your care team tells you to stop. It is important that you put your used needles and syringes in a special sharps container. Do not put them in a trash can. If you do not have a sharps container, call your pharmacist or care team to get one. A special MedGuide will be given to you by the pharmacist with each prescription and refill. Be sure to read this information carefully each time. Talk to your care team about the use of this medication in children. While this medication may be used in children as young as 1 month of age for selected conditions, precautions do apply. Overdosage: If you think you have taken too much  of this medicine contact a poison control center or emergency room at once. NOTE: This medicine is only for you. Do not share this medicine with others. What if I miss a dose? If you miss a dose, take it as soon as you can. If it is almost time for your next dose, take only that dose. Do not take double or extra doses. What may interact with this medication? Darbepoetin alfa Methoxy polyethylene glycol-epoetin beta This list may not describe all possible interactions. Give your health care provider a list of all the medicines, herbs, non-prescription drugs, or dietary supplements you use. Also tell them if you smoke, drink alcohol, or use illegal drugs. Some items may interact with your medicine. What should I watch for while using this medication? Visit your care team for regular checks on your progress. Check your blood pressure as directed. Know what your blood pressure should be and when to contact your care team. Your condition will be monitored carefully while you are receiving this medication. You may need blood work while taking this medication. What side effects may I notice from receiving this medication? Side effects that you should report to your care team as soon as possible: Allergic reactions--skin rash, itching, hives, swelling of the face, lips, tongue, or throat Blood clot--pain, swelling, or warmth in the leg, shortness of breath, chest pain Heart attack--pain or tightness in the chest, shoulders, arms, or jaw, nausea, shortness of breath, cold or clammy skin, feeling faint or lightheaded Increase  in blood pressure Rash, fever, and swollen lymph nodes Redness, blistering, peeling, or loosening of the skin, including inside the mouth Seizures Stroke--sudden numbness or weakness of the face, arm, or leg, trouble speaking, confusion, trouble walking, loss of balance or coordination, dizziness, severe headache, change in vision Side effects that usually do not require medical  attention (report to your care team if they continue or are bothersome): Bone, joint, or muscle pain Cough Headache Nausea Pain, redness, or irritation at injection site This list may not describe all possible side effects. Call your doctor for medical advice about side effects. You may report side effects to FDA at 1-800-FDA-1088. Where should I keep my medication? Keep out of the reach of children and pets. Store in a refrigerator. Do not freeze. Do not shake. Protect from light. Keep this medication in the original container until you are ready to take it. See product for storage information. Get rid of any unused medication after the expiration date. To get rid of medications that are no longer needed or have expired: Take the medication to a medication take-back program. Check with your pharmacy or law enforcement to find a location. If you cannot return the medication, ask your pharmacist or care team how to get rid of the medication safely. NOTE: This sheet is a summary. It may not cover all possible information. If you have questions about this medicine, talk to your doctor, pharmacist, or health care provider.  2024 Elsevier/Gold Standard (2022-04-17 00:00:00) Denosumab Injection (Oncology) What is this medication? DENOSUMAB (den oh SUE mab) prevents weakened bones caused by cancer. It may also be used to treat noncancerous bone tumors that cannot be removed by surgery. It can also be used to treat high calcium levels in the blood caused by cancer. It works by blocking a protein that causes bones to break down quickly. This slows down the release of calcium from bones, which lowers calcium levels in your blood. It also makes your bones stronger and less likely to break (fracture). This medicine may be used for other purposes; ask your health care provider or pharmacist if you have questions. COMMON BRAND NAME(S): XGEVA What should I tell my care team before I take this medication? They  need to know if you have any of these conditions: Dental disease Having surgery or tooth extraction Infection Kidney disease Low levels of calcium or vitamin D in the blood Malnutrition On hemodialysis Skin conditions or sensitivity Thyroid or parathyroid disease An unusual reaction to denosumab, other medications, foods, dyes, or preservatives Pregnant or trying to get pregnant Breast-feeding How should I use this medication? This medication is for injection under the skin. It is given by your care team in a hospital or clinic setting. A special MedGuide will be given to you before each treatment. Be sure to read this information carefully each time. Talk to your care team about the use of this medication in children. While it may be prescribed for children as young as 13 years for selected conditions, precautions do apply. Overdosage: If you think you have taken too much of this medicine contact a poison control center or emergency room at once. NOTE: This medicine is only for you. Do not share this medicine with others. What if I miss a dose? Keep appointments for follow-up doses. It is important not to miss your dose. Call your care team if you are unable to keep an appointment. What may interact with this medication? Do not take this medication with any  of the following: Other medications containing denosumab This medication may also interact with the following: Medications that lower your chance of fighting infection Steroid medications, such as prednisone or cortisone This list may not describe all possible interactions. Give your health care provider a list of all the medicines, herbs, non-prescription drugs, or dietary supplements you use. Also tell them if you smoke, drink alcohol, or use illegal drugs. Some items may interact with your medicine. What should I watch for while using this medication? Your condition will be monitored carefully while you are receiving this  medication. You may need blood work while taking this medication. This medication may increase your risk of getting an infection. Call your care team for advice if you get a fever, chills, sore throat, or other symptoms of a cold or flu. Do not treat yourself. Try to avoid being around people who are sick. You should make sure you get enough calcium and vitamin D while you are taking this medication, unless your care team tells you not to. Discuss the foods you eat and the vitamins you take with your care team. Some people who take this medication have severe bone, joint, or muscle pain. This medication may also increase your risk for jaw problems or a broken thigh bone. Tell your care team right away if you have severe pain in your jaw, bones, joints, or muscles. Tell your care team if you have any pain that does not go away or that gets worse. Talk to your care team if you may be pregnant. Serious birth defects can occur if you take this medication during pregnancy and for 5 months after the last dose. You will need a negative pregnancy test before starting this medication. Contraception is recommended while taking this medication and for 5 months after the last dose. Your care team can help you find the option that works for you. What side effects may I notice from receiving this medication? Side effects that you should report to your care team as soon as possible: Allergic reactions--skin rash, itching, hives, swelling of the face, lips, tongue, or throat Bone, joint, or muscle pain Low calcium level--muscle pain or cramps, confusion, tingling, or numbness in the hands or feet Osteonecrosis of the jaw--pain, swelling, or redness in the mouth, numbness of the jaw, poor healing after dental work, unusual discharge from the mouth, visible bones in the mouth Side effects that usually do not require medical attention (report to your care team if they continue or are  bothersome): Cough Diarrhea Fatigue Headache Nausea This list may not describe all possible side effects. Call your doctor for medical advice about side effects. You may report side effects to FDA at 1-800-FDA-1088. Where should I keep my medication? This medication is given in a hospital or clinic. It will not be stored at home. NOTE: This sheet is a summary. It may not cover all possible information. If you have questions about this medicine, talk to your doctor, pharmacist, or health care provider.  2024 [employer]/Gold Standard (2022-05-06 00:00:00)

## 2023-11-10 NOTE — Progress Notes (Signed)
Patients Hgb 9.0 and Ca-8.3 and creatinine 1.43. Patient tolerated Xgeva & Retacrit injection with no complaints voiced.  Site clean and dry with no bruising or swelling noted at site.  See MAR for details.  Band aid applied.  Patient stable during and after injection.  Vss with discharge and left in satisfactory condition with no s/s of distress noted. All follow ups as scheduled.   Johnny Lucas [employer]

## 2023-11-12 DIAGNOSIS — Z9889 Other specified postprocedural states: Secondary | ICD-10-CM | POA: Diagnosis not present

## 2023-11-12 DIAGNOSIS — J324 Chronic pansinusitis: Secondary | ICD-10-CM | POA: Diagnosis not present

## 2023-11-15 ENCOUNTER — Other Ambulatory Visit: Payer: Self-pay

## 2023-11-15 DIAGNOSIS — C9000 Multiple myeloma not having achieved remission: Secondary | ICD-10-CM

## 2023-11-16 ENCOUNTER — Other Ambulatory Visit: Payer: Self-pay | Admitting: Hematology

## 2023-11-17 DIAGNOSIS — D485 Neoplasm of uncertain behavior of skin: Secondary | ICD-10-CM | POA: Diagnosis not present

## 2023-11-17 DIAGNOSIS — Z08 Encounter for follow-up examination after completed treatment for malignant neoplasm: Secondary | ICD-10-CM | POA: Diagnosis not present

## 2023-11-17 DIAGNOSIS — D225 Melanocytic nevi of trunk: Secondary | ICD-10-CM | POA: Diagnosis not present

## 2023-11-17 DIAGNOSIS — C44622 Squamous cell carcinoma of skin of right upper limb, including shoulder: Secondary | ICD-10-CM | POA: Diagnosis not present

## 2023-11-17 DIAGNOSIS — Z85828 Personal history of other malignant neoplasm of skin: Secondary | ICD-10-CM | POA: Diagnosis not present

## 2023-11-23 ENCOUNTER — Other Ambulatory Visit: Payer: Self-pay

## 2023-12-02 ENCOUNTER — Inpatient Hospital Stay: Payer: Medicare Other | Attending: Hematology

## 2023-12-02 DIAGNOSIS — D801 Nonfamilial hypogammaglobulinemia: Secondary | ICD-10-CM | POA: Diagnosis not present

## 2023-12-02 DIAGNOSIS — Z7951 Long term (current) use of inhaled steroids: Secondary | ICD-10-CM | POA: Insufficient documentation

## 2023-12-02 DIAGNOSIS — G629 Polyneuropathy, unspecified: Secondary | ICD-10-CM | POA: Diagnosis not present

## 2023-12-02 DIAGNOSIS — Z7961 Long term (current) use of immunomodulator: Secondary | ICD-10-CM | POA: Insufficient documentation

## 2023-12-02 DIAGNOSIS — I129 Hypertensive chronic kidney disease with stage 1 through stage 4 chronic kidney disease, or unspecified chronic kidney disease: Secondary | ICD-10-CM | POA: Diagnosis not present

## 2023-12-02 DIAGNOSIS — Z853 Personal history of malignant neoplasm of breast: Secondary | ICD-10-CM | POA: Diagnosis not present

## 2023-12-02 DIAGNOSIS — D631 Anemia in chronic kidney disease: Secondary | ICD-10-CM | POA: Diagnosis not present

## 2023-12-02 DIAGNOSIS — Z9221 Personal history of antineoplastic chemotherapy: Secondary | ICD-10-CM | POA: Diagnosis not present

## 2023-12-02 DIAGNOSIS — C9000 Multiple myeloma not having achieved remission: Secondary | ICD-10-CM

## 2023-12-02 DIAGNOSIS — K219 Gastro-esophageal reflux disease without esophagitis: Secondary | ICD-10-CM | POA: Diagnosis not present

## 2023-12-02 DIAGNOSIS — N189 Chronic kidney disease, unspecified: Secondary | ICD-10-CM | POA: Diagnosis not present

## 2023-12-02 DIAGNOSIS — Z7982 Long term (current) use of aspirin: Secondary | ICD-10-CM | POA: Insufficient documentation

## 2023-12-02 DIAGNOSIS — Z5112 Encounter for antineoplastic immunotherapy: Secondary | ICD-10-CM | POA: Diagnosis not present

## 2023-12-02 DIAGNOSIS — C9002 Multiple myeloma in relapse: Secondary | ICD-10-CM | POA: Diagnosis not present

## 2023-12-02 DIAGNOSIS — Z79624 Long term (current) use of inhibitors of nucleotide synthesis: Secondary | ICD-10-CM | POA: Diagnosis not present

## 2023-12-02 LAB — CBC WITH DIFFERENTIAL/PLATELET
Abs Immature Granulocytes: 0 10*3/uL (ref 0.00–0.07)
Basophils Absolute: 0.1 10*3/uL (ref 0.0–0.1)
Basophils Relative: 2 %
Eosinophils Absolute: 0.3 10*3/uL (ref 0.0–0.5)
Eosinophils Relative: 10 %
HCT: 30 % — ABNORMAL LOW (ref 39.0–52.0)
Hemoglobin: 9.5 g/dL — ABNORMAL LOW (ref 13.0–17.0)
Immature Granulocytes: 0 %
Lymphocytes Relative: 42 %
Lymphs Abs: 1.1 10*3/uL (ref 0.7–4.0)
MCH: 34.3 pg — ABNORMAL HIGH (ref 26.0–34.0)
MCHC: 31.7 g/dL (ref 30.0–36.0)
MCV: 108.3 fL — ABNORMAL HIGH (ref 80.0–100.0)
Monocytes Absolute: 0.8 10*3/uL (ref 0.1–1.0)
Monocytes Relative: 29 %
Neutro Abs: 0.5 10*3/uL — ABNORMAL LOW (ref 1.7–7.7)
Neutrophils Relative %: 17 %
Platelets: 126 10*3/uL — ABNORMAL LOW (ref 150–400)
RBC: 2.77 MIL/uL — ABNORMAL LOW (ref 4.22–5.81)
RDW: 14.5 % (ref 11.5–15.5)
WBC: 2.7 10*3/uL — ABNORMAL LOW (ref 4.0–10.5)
nRBC: 0 % (ref 0.0–0.2)

## 2023-12-02 LAB — COMPREHENSIVE METABOLIC PANEL
ALT: 10 U/L (ref 0–44)
AST: 10 U/L — ABNORMAL LOW (ref 15–41)
Albumin: 3.1 g/dL — ABNORMAL LOW (ref 3.5–5.0)
Alkaline Phosphatase: 28 U/L — ABNORMAL LOW (ref 38–126)
Anion gap: 5 (ref 5–15)
BUN: 12 mg/dL (ref 8–23)
CO2: 24 mmol/L (ref 22–32)
Calcium: 8.5 mg/dL — ABNORMAL LOW (ref 8.9–10.3)
Chloride: 108 mmol/L (ref 98–111)
Creatinine, Ser: 1.29 mg/dL — ABNORMAL HIGH (ref 0.61–1.24)
GFR, Estimated: 58 mL/min — ABNORMAL LOW (ref >60)
Glucose, Bld: 94 mg/dL (ref 70–99)
Potassium: 4.3 mmol/L (ref 3.5–5.1)
Sodium: 137 mmol/L (ref 135–145)
Total Bilirubin: 0.5 mg/dL (ref <1.2)
Total Protein: 5.2 g/dL — ABNORMAL LOW (ref 6.5–8.1)

## 2023-12-02 LAB — MAGNESIUM: Magnesium: 1.7 mg/dL (ref 1.7–2.4)

## 2023-12-02 MED ORDER — HEPARIN SOD (PORK) LOCK FLUSH 100 UNIT/ML IV SOLN
500.0000 [IU] | Freq: Once | INTRAVENOUS | Status: AC
  Administered 2023-12-02: 500 [IU] via INTRAVENOUS

## 2023-12-02 MED ORDER — SODIUM CHLORIDE 0.9% FLUSH
10.0000 mL | Freq: Once | INTRAVENOUS | Status: AC
  Administered 2023-12-02: 10 mL via INTRAVENOUS

## 2023-12-02 NOTE — Progress Notes (Signed)
Patients port flushed without difficulty. Good blood return noted with no bruising or swelling noted at site. Band aid applied. VSS with discharge and left in satisfactory condition with no s/s of distress noted. All follow ups as scheduled.  Johnny Lucas [employer]

## 2023-12-03 LAB — KAPPA/LAMBDA LIGHT CHAINS
Kappa free light chain: 2 mg/L — ABNORMAL LOW (ref 3.3–19.4)
Kappa, lambda light chain ratio: 0.87 (ref 0.26–1.65)
Lambda free light chains: 2.3 mg/L — ABNORMAL LOW (ref 5.7–26.3)

## 2023-12-08 NOTE — Progress Notes (Signed)
[facility] [address] [place] [address]    Clinic Day:  12/21/2023  Referring physician: Doreatha Massed, MD  Patient Care Team: Johnny Massed, MD as PCP - General (Hematology) Johnny Massed, MD as Consulting Physician (Oncology)   ASSESSMENT & PLAN:   Assessment: 1.  Relapsed multiple myeloma: -Autologous stem cell transplant on 10/08/2017 -Post transplant consolidation with CyCarD for 2 cycles with M spike undetectable. -Maintenance therapy with carfilzomib 70 mg per metered square days 1 and 15 every 28 days from 04/20/2018, dose reduced to 35 mg per metered square on 01/05/2019, titrated up to 56 mg per metered square on 01/09/2020. 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 -PET scan on 02/18/2020 showed new left supraclavicular lymph node at the thoracic inlet, SUV 14.2.  Multifocal osseous lesions largely improved, though residual lesions noted including right acromion, left acromion, thyroid cartilage, right sternum.  Additional lesions throughout the sternum, bilateral ribs, thoracolumbar spine, bilateral pelvis have resolved. -BMBX on 02/14/2020 shows plasma cell myeloma, 60-70% of cells.  FISH for myeloma was negative.  Cytogenetics was normal. -4 cycles of daratumumab, bortezomib and dexamethasone from 02/28/2020 through 04/29/2020. -Daratumumab, pomalidomide and dexamethasone started on 05/28/2020.  He is receiving daratumumab every 2 weeks, Pomalyst 3 mg days 1-21, dexamethasone 20 mg weekly. -Pomalidomide dose reduced to 2 mg days 1-21 around the first week of July 2021, due to severe weakness.    Plan: 1.  Relapsed IgG kappa multiple myeloma: - He is tolerating pomalidomide, Darzalex and dexamethasone reasonably well. - 2 weeks ago he reports waking up with numbness of the nose, gained back feeling on left side of the nose.  Reports pins and needle sensation on the right cheek.  He had  sinus surgery on the right side in January.  No recent infections noted. - Pomalidomide was started back on 12/06/2023. - Labs today: M spike not observed.  FLC ratio is normal at 0.87.  Kappa light chains are 2.0.  Immunofixation shows IgG kappa. - White count on 12/02/2023 is 2.7 today with ANC of 0.5.  Repeat CBC today shows normal ANC. - He will proceed with Darzalex today. - Will give him Augmentin 8 7 5  mg to his daily to see if it improves the right cheek pins and needle sensation.  RTC 8 weeks for follow-up with multiple myeloma labs.   2.  Myeloma bone disease: - We will hold his denosumab today.   3.  Macrocytic anemia: - Anemia from CKD, functional iron deficiency and myelosuppression.  Hemoglobin staying stable between 9-10.  Will give Retacrit if it drops below 9.   4.  ID prophylaxis: - Continue acyclovir twice daily.   5.  Peripheral neuropathy: - Continue gabapentin 600 mg 3 times daily.  Neuropathy is slightly worse in the hand.  If any worsening, will consider adding Cymbalta.   6.  History of breast cancer: - Continue tamoxifen daily.  7.  Hypogammaglobulinemia: - IgG levels are severely low.  He does not have recurrent infections.  Will start IVIG if he develops infections.    No orders of the defined types were placed in this encounter.      Johnny Lucas,acting as a Neurosurgeon for Johnny Massed, MD.,have documented all relevant documentation on the behalf of Johnny Massed, MD,as directed by  Johnny Massed, MD while in the presence of Johnny Massed, MD.  I, Johnny Massed MD, have reviewed the above documentation for accuracy and completeness,  and I agree with the above.      Johnny Massed, MD   12/24/20241:07 PM  CHIEF COMPLAINT:   Diagnosis: multiple myeloma    Cancer Staging  Multiple myeloma ([name] Staging form: Plasma Cell Myeloma and Plasma Cell Disorders, AJCC 8th Edition - Clinical stage from 05/03/2017:  Beta-2-microglobulin (mg/L): 3.5, Albumin (g/dL): 3.4, ISS: Stage II, High-risk cytogenetics: Absent, LDH: Not assessed - Signed by Ellouise Newer, PA-C on 05/03/2017    Prior Therapy: 1. RVD x 6 cycles from 03/01/2017 to 06/29/2017. 2. Stem cell transplant on 09/30/2017. 3. Carfilzomib and cyclophosphamide x 2 cycles from 02/14/2018 to 03/29/2018. 4. Carfilzomib x 22 cycles from 04/20/2018 to 01/23/2020.  Current Therapy:  Darzalex Faspro monthly; Pomalyst 2 mg 3/4 weeks; Xgeva monthly    HISTORY OF PRESENT ILLNESS:   Oncology History  Breast cancer, male ([name]  12/31/2009 Initial Biopsy   Biopsy of L breast    12/31/2009 Pathology Results   Invasive ductal carcinoma, ER/PR+, HER 2 negative   12/31/2009 Imaging   Ultrasound showing a 2.43 x 1.85 x 3 cm hypoechoic spiculated mass in the 12 o clock L breast retroareolar region   01/01/2010 -  Anti-estrogen oral therapy   Tamoxifen 20 mg daily   01/06/2010 Imaging   Bone scan abnormal uptake in the diaphysis of the R humerus, abnormal in the R third, fifth and sixth ribs, lesion also noted in the sternum.   02/03/2010 Surgery   Rod placement and fixation of R humerus by Dr. Amanda Pea   02/05/2010 - 02/18/2010 Radiation Therapy   30Gy in 10 fractions of 3 Gy per fraction to R pathologic fracture   03/11/2010 -  Chemotherapy   Denosumab monthly, now every 3 months. Started at [facility]    06/09/2016 Imaging   Three hypermetabolic osseous lesions in the sternum, left ilium and right ilium, as discussed above, likely represent osseous metastases. At this time, these are not recognizable on the CT images. 2. No extra skeletal metastatic disease identified in the neck, chest, abdomen or pelvis.   10/13/2016 Progression   PET shows various new and enlarging osseous metastatic lesions with no definite extra osseous metastatic disease currently identified.    12/31/2016 Progression   1. Multifocal hypermetabolic osseous metastases throughout the  axial and proximal appendicular skeleton, which are increased in size, number and metabolism since 10/13/2016 PET-CT. 2. 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 3. No additional sites of hypermetabolic metastatic disease. 4. Chronic right mastoid sinusitis. 5. Aortic atherosclerosis.  One vessel coronary atherosclerosis.   Multiple myeloma ([name]  02/12/2017 Bone Marrow Biopsy   The marrow was variably cellular with large peritrabecular aggregates of kappa restricted plasma cells (66% by aspirate, 30% by Cd138). Cytogenetics +11.    03/01/2017 - 06/29/2017 Chemotherapy   RVD    05/26/2017 Bone Marrow Biopsy   Performed at [facility]:  Plasma cell myeloma in a 30% cellular marrow with decreased trilineage hematopoiesis and 42% kappa light chain restricted plasma cells on the aspirate smears and large aggregates on the core biopsy.     07/12/2017 - 09/01/2017 Chemotherapy   3 cycles of carfizolmib/cyclophosphamide/dexamethasone     10/08/2017 Bone Marrow Transplant   Autotransplant at [facility]   02/28/2020 - 09/01/2022 Chemotherapy   Patient is on Treatment Plan : Daratumumab q28d     09/01/2022 -  Chemotherapy   Patient is on  Treatment Plan : MYELOMA Daratumumab Faspro Subq q28d     Multiple myeloma not having achieved remission ([name]  06/29/2017 Initial Diagnosis   Multiple myeloma not having achieved remission ([name]   02/28/2020 - 09/01/2022 Chemotherapy   Patient is on Treatment Plan : Daratumumab q28d     09/01/2022 -  Chemotherapy   Patient is on Treatment Plan : MYELOMA Daratumumab Faspro Subq q28d        INTERVAL HISTORY:   Johnny Lucas is a 77 y.o. male presenting to clinic today for follow up of multiple myeloma. He was last seen by me on 10/12/23.  Today, he states that he is doing well overall. His appetite level is at 100%. His energy  level is at 65%.  He notes 2 weeks ago he woke up with his nose numb. The left side of the nose is no longer numb, though the right side is still numb. He denies any green colored drainage or phlegm with cough. He reports occasional sharp pain on the right maxillary region, as well as mouth and gum swelling with the sensation of loose teeth. He was seen by the dentist. He also went to his ENT since his last visit, who reportedly said the left side of his nose had no polyps and his right side has some polyps after a nasal and sinus endoscopy. He was given a steroid spray for polyps on right side of nose. He has a history of right-sided sinus surgery. He reports 3 months ago he was prescribed a Z-pac by PCP, he denies being on any other antibiotics. He denies any recent infections.  He started his current cycle of Pomalyst on 12/06/23. He is taking dexamethasone as prescribed. His neuropathy has slightly worsened in the heels of the feet.   PAST MEDICAL HISTORY:   Past Medical History: Past Medical History:  Diagnosis Date   Anxiety    Bone metastases 09/10/2016   Breast cancer, male [facility]) 2011   Stage IV breast cancer; radiation and tamoxifen  Overview:  Left breast ca with mets bone  Overview:  METS TO BONE   Bronchitis    uses inhaler   Cough    uses inhaler   GERD (gastroesophageal reflux disease)    Headache    History of blood transfusion    Hypertension    Macular degeneration    bilateral   Memory loss    mild   Multiple myeloma ([name] 02/18/2017   Peripheral neuropathy    feet   Pneumonia    mild case 1-2 yrs ago (2023 or 2024)    Surgical History: Past Surgical History:  Procedure Laterality Date   BACK SURGERY     C2-C3 fusion   BREAST BIOPSY Left    cancer   CATARACT EXTRACTION W/PHACO Left 02/03/2019   Procedure: CATARACT EXTRACTION PHACO AND INTRAOCULAR LENS PLACEMENT (IOC);  Surgeon: Fabio Pierce, MD;  Location: [facility];  Service: Ophthalmology;  Laterality:  Left;  CDE: 15.89   CATARACT EXTRACTION W/PHACO Right 07/25/2021   Procedure: CATARACT EXTRACTION PHACO AND INTRAOCULAR LENS PLACEMENT (IOC);  Surgeon: Fabio Pierce, MD;  Location: [facility];  Service: Ophthalmology;  Laterality: Right;  CDE   26.71   COLONOSCOPY     HERNIA REPAIR     PORTACATH PLACEMENT Left 06/10/2018   Procedure: INSERTION PORT-A-CATH;  Surgeon: Lucretia Roers, MD;  Location: [facility];  Service: General;  Laterality: Left;   right arm surgery     bone cancer and rod placed  SINUS ENDO WITH FUSION Bilateral 03/25/2023   Procedure: FUNCTIONAL ENDOSCOPIC SINUS SURGERY WITH FUSION AND NAVIGATION;  Surgeon: [name], MD;  Location: [facility];  Service: ENT;  Laterality: Bilateral;    Social History: Social History   Socioeconomic History   Marital status: Married    Spouse name: Not on file   Number of children: 3   Years of education: Not on file   Highest education level: Not on file  Occupational History   Not on file  Tobacco Use   Smoking status: Never   Smokeless tobacco: Former    Types: Designer, multimedia Use   Vaping status: Never Used  Substance and Sexual Activity   Alcohol use: Yes    Alcohol/week: 24.0 standard drinks of alcohol    Types: 24 Cans of beer per week    Comment: No ETOH in the last 6 months as of 02/2023   Drug use: No   Sexual activity: Yes  Other Topics Concern   Not on file  Social History Narrative   Not on file   Social Drivers of Health   Financial Resource Strain: Low Risk  (11/26/2020)   Overall Financial Resource Strain (CARDIA)    Difficulty of Paying Living Expenses: Not hard at all  Food Insecurity: Low Risk  (07/12/2023)   Received from [facility]   Hunger Vital Sign    Worried About Running Out of Food in the Last Year: Never true    Ran Out of Food in the Last Year: Never true  Transportation Needs: Not on file (07/12/2023)  Physical Activity: Inactive (11/26/2020)   Exercise Vital Sign    Days of Exercise per  Week: 0 days    Minutes of Exercise per Session: 0 min  Stress: Stress Concern Present (11/26/2020)   [employer] of Occupational Health - Occupational Stress Questionnaire    Feeling of Stress : To some extent  Social Connections: Moderately Integrated (11/26/2020)   Social Connection and Isolation Panel [NHANES]    Frequency of Communication with Friends and Family: More than three times a week    Frequency of Social Gatherings with Friends and Family: Three times a week    Attends Religious Services: 1 to 4 times per year    Active Member of Clubs or Organizations: No    Attends Banker Meetings: Never    Marital Status: Married  Catering manager Violence: Not At Risk (11/26/2020)   Humiliation, Afraid, Rape, and Kick questionnaire    Fear of Current or Ex-Partner: No    Emotionally Abused: No    Physically Abused: No    Sexually Abused: No    Family History: Family History  Problem Relation Age of Onset   Stroke Mother    Cancer Maternal Aunt        cancer NOS; died in her 14s   Lung cancer Maternal Uncle        smoker    Current Medications:  Current Outpatient Medications:    acetaminophen (TYLENOL) 500 MG tablet, Take 1,000 mg by mouth daily., Disp: , Rfl:    acyclovir (ZOVIRAX) 800 MG tablet, TAKE 1 TABLET TWICE DAILY, Disp: 180 tablet, Rfl: 3   albuterol (VENTOLIN HFA) 108 (90 Base) MCG/ACT inhaler, Inhale 2 puffs into the lungs every 6 (six) hours as needed for wheezing or shortness of breath., Disp: , Rfl:    ALPRAZolam (XANAX) 0.5 MG tablet, TAKE 1 TABLET BY MOUTH AT BEDTIME AS NEEDED FOR anxiety / SLEEP,  Disp: 30 tablet, Rfl: 5   amoxicillin-clavulanate (AUGMENTIN) 875-125 MG tablet, Take 1 tablet by mouth 2 (two) times daily., Disp: 14 tablet, Rfl: 0   aspirin EC 81 MG tablet, Take 81 mg by mouth daily. , Disp: , Rfl:    budesonide (PULMICORT) 0.5 MG/2ML nebulizer solution, Add 2 mL (1 vial) of budesonide into 240 mL saline irrigation bottle  and irrigate both nostrils morning and night (one bottle total per day)., Disp: , Rfl:    cetirizine (ZYRTEC) 10 MG tablet, Take 10 mg by mouth daily., Disp: , Rfl:    denosumab (XGEVA) 120 MG/1.7ML SOLN injection, Inject 120 mg into the skin every 30 (thirty) days. , Disp: , Rfl:    dexamethasone (DECADRON) 4 MG tablet, TAKE FIVE TABLETS BY MOUTH WEEKLY, Disp: 40 tablet, Rfl: 5   diphenhydrAMINE (BENADRYL) 12.5 MG/5ML elixir, Take 12.5 mg by mouth daily as needed., Disp: , Rfl:    folic acid (FOLVITE) 1 MG tablet, TAKE 1 TABLET BY MOUTH DAILY, Disp: 90 tablet, Rfl: 2   gabapentin (NEURONTIN) 300 MG capsule, TAKE ONE CAPSULE BY MOUTH THREE TIMES DAILY, Disp: 180 capsule, Rfl: 2   Loperamide HCl (IMODIUM PO), Take 2 mg by mouth daily as needed (diarrhea)., Disp: , Rfl:    loratadine (CLARITIN) 10 MG tablet, Take 1 tablet (10 mg total) by mouth daily., Disp: 90 tablet, Rfl: 6   metoprolol succinate (TOPROL-XL) 100 MG 24 hr tablet, Take 100 mg by mouth in the morning., Disp: , Rfl:    ondansetron (ZOFRAN) 8 MG tablet, TAKE 1 TABLET BY MOUTH EVERY 8 HOURS AS NEEDED FOR NAUSEA AND VOMITING, Disp: 30 tablet, Rfl: 2   pantoprazole (PROTONIX) 40 MG tablet, TAKE 1 TABLET EVERY OTHER DAY, Disp: 45 tablet, Rfl: 3   PERIDEX 0.12 % solution, Use as directed 15 mLs in the mouth or throat 2 (two) times daily., Disp: , Rfl:    polyethylene glycol (MIRALAX / GLYCOLAX) packet, Take 17 g by mouth daily as needed for mild constipation., Disp: 14 each, Rfl: 0   sildenafil (VIAGRA) 100 MG tablet, 100 mg as needed for erectile dysfunction., Disp: , Rfl:    tamoxifen (NOLVADEX) 20 MG tablet, TAKE 1 TABLET EVERY DAY, Disp: 90 tablet, Rfl: 3   triamcinolone (KENALOG) 0.1 % paste, Use as directed 1 Application in the mouth or throat 3 (three) times daily. as directed, Disp: , Rfl:    pomalidomide (POMALYST) 2 MG capsule, TAKE 1 CAPSULE BY MOUTH EVERY DAY FOR 21 DAYS ON FOLLOWED BY 7 DAYS OFF, Disp: 21 capsule, Rfl: 0 No  current facility-administered medications for this visit.  Facility-Administered Medications Ordered in Other Visits:    ondansetron (ZOFRAN) 8 mg in sodium chloride 0.9 % 50 mL IVPB, 8 mg, Intravenous, Once, Lockamy, Randi L, NP-C   sodium chloride flush (NS) 0.9 % injection 10 mL, 10 mL, Intracatheter, Once PRN, Johnny Massed, MD   Allergies: Allergies  Allergen Reactions   Cefepime     Suspected severe thrombocytopenia is a result of cefepime induced antigen platelet destruction    REVIEW OF SYSTEMS:   Review of Systems  Constitutional:  Negative for chills, fatigue and fever.  HENT:   Negative for lump/mass, mouth sores, nosebleeds, sore throat and trouble swallowing.   Eyes:  Negative for eye problems.  Respiratory:  Negative for cough and shortness of breath.   Cardiovascular:  Negative for chest pain, leg swelling and palpitations.  Gastrointestinal:  Negative for abdominal pain, constipation, diarrhea, nausea  and vomiting.  Genitourinary:  Negative for bladder incontinence, difficulty urinating, dysuria, frequency, hematuria and nocturia.   Musculoskeletal:  Negative for arthralgias, back pain, flank pain, myalgias and neck pain.  Skin:  Negative for itching and rash.  Neurological:  Negative for dizziness, headaches and numbness.       +tingling feet  Hematological:  Does not bruise/bleed easily.  Psychiatric/Behavioral:  Negative for depression, sleep disturbance and suicidal ideas. The patient is not nervous/anxious.   All other systems reviewed and are negative.    VITALS:   Blood pressure 122/64, pulse 65, temperature 99.4 F (37.4 C), temperature source Tympanic, resp. rate 20, weight 195 lb 8.8 oz (88.7 kg), SpO2 100%.  Wt Readings from Last 3 Encounters:  12/09/23 195 lb 8.8 oz (88.7 kg)  11/09/23 199 lb (90.3 kg)  10/12/23 189 lb 12.8 oz (86.1 kg)    Body mass index is 28.88 kg/m.  Performance status (ECOG): 1 - Symptomatic but completely  ambulatory  PHYSICAL EXAM:   Physical Exam Vitals and nursing note reviewed. Exam conducted with a chaperone present.  Constitutional:      Appearance: Normal appearance.  HENT:     Head:     Comments: +tenderness on right maxillary Cardiovascular:     Rate and Rhythm: Normal rate and regular rhythm.     Pulses: Normal pulses.     Heart sounds: Normal heart sounds.  Pulmonary:     Effort: Pulmonary effort is normal.     Breath sounds: Normal breath sounds.  Abdominal:     Palpations: Abdomen is soft. There is no hepatomegaly, splenomegaly or mass.     Tenderness: There is no abdominal tenderness.  Musculoskeletal:     Right lower leg: No edema.     Left lower leg: No edema.  Lymphadenopathy:     Cervical: No cervical adenopathy.     Right cervical: No superficial, deep or posterior cervical adenopathy.    Left cervical: No superficial, deep or posterior cervical adenopathy.     Upper Body:     Right upper body: No supraclavicular or axillary adenopathy.     Left upper body: No supraclavicular or axillary adenopathy.  Neurological:     General: No focal deficit present.     Mental Status: He is alert and oriented to person, place, and time.  Psychiatric:        Mood and Affect: Mood normal.        Behavior: Behavior normal.     LABS:      Latest Ref Rng & Units 12/09/2023   10:57 AM 12/02/2023   10:23 AM 11/09/2023   12:28 PM  CBC  WBC 4.0 - 10.5 K/uL 4.9  2.7  5.3   Hemoglobin 13.0 - 17.0 g/dL 78.2  9.5  9.0   Hematocrit 39.0 - 52.0 % 32.1  30.0  27.6   Platelets 150 - 400 K/uL 240  126  175       Latest Ref Rng & Units 12/02/2023   10:23 AM 11/09/2023   12:28 PM 10/05/2023   11:55 AM  CMP  Glucose 70 - 99 mg/dL 94  95  91   BUN 8 - 23 mg/dL 12  18  15    Creatinine 0.61 - 1.24 mg/dL 9.56  2.13  0.86   Sodium 135 - 145 mmol/L 137  135  133   Potassium 3.5 - 5.1 mmol/L 4.3  4.4  4.0   Chloride 98 - 111 mmol/L 108  106  102   CO2 22 - 32 mmol/L 24  24  23     Calcium 8.9 - 10.3 mg/dL 8.5  8.3  8.5   Total Protein 6.5 - 8.1 g/dL 5.2  5.2  5.3   Total Bilirubin <1.2 mg/dL 0.5  0.3  0.8   Alkaline Phos 38 - 126 U/L 28  27  30    AST 15 - 41 U/L 10  11  10    ALT 0 - 44 U/L 10  10  12       No results found for: "CEA1", "CEA" / No results found for: "CEA1", "CEA" No results found for: "PSA1" No results found for: "AOZ308" No results found for: "CAN125"  Lab Results  Component Value Date   TOTALPROTELP 4.8 (L) 12/02/2023   TOTALPROTELP 4.7 (L) 12/02/2023   ALBUMINELP 3.1 12/02/2023   A1GS 0.3 12/02/2023   A2GS 0.5 12/02/2023   BETS 0.7 12/02/2023   GAMS 0.1 (L) 12/02/2023   MSPIKE Not Observed 12/02/2023   SPEI Comment 12/02/2023   Lab Results  Component Value Date   TIBC 314 03/02/2023   TIBC 308 08/25/2022   TIBC 325 03/25/2020   FERRITIN 161 03/02/2023   FERRITIN 170 08/25/2022   FERRITIN 640 (H) 03/25/2020   IRONPCTSAT 11 (L) 03/02/2023   IRONPCTSAT 24 08/25/2022   IRONPCTSAT 10 (L) 03/25/2020   Lab Results  Component Value Date   LDH 131 11/18/2021   LDH 127 09/30/2021   LDH 102 08/05/2021     STUDIES:   No results found.

## 2023-12-09 ENCOUNTER — Inpatient Hospital Stay: Payer: Medicare Other | Admitting: Hematology

## 2023-12-09 ENCOUNTER — Inpatient Hospital Stay: Payer: Medicare Other

## 2023-12-09 DIAGNOSIS — I129 Hypertensive chronic kidney disease with stage 1 through stage 4 chronic kidney disease, or unspecified chronic kidney disease: Secondary | ICD-10-CM | POA: Diagnosis not present

## 2023-12-09 DIAGNOSIS — N189 Chronic kidney disease, unspecified: Secondary | ICD-10-CM | POA: Diagnosis not present

## 2023-12-09 DIAGNOSIS — C9000 Multiple myeloma not having achieved remission: Secondary | ICD-10-CM

## 2023-12-09 DIAGNOSIS — C9002 Multiple myeloma in relapse: Secondary | ICD-10-CM | POA: Diagnosis not present

## 2023-12-09 DIAGNOSIS — D801 Nonfamilial hypogammaglobulinemia: Secondary | ICD-10-CM | POA: Diagnosis not present

## 2023-12-09 DIAGNOSIS — D631 Anemia in chronic kidney disease: Secondary | ICD-10-CM | POA: Diagnosis not present

## 2023-12-09 DIAGNOSIS — Z5112 Encounter for antineoplastic immunotherapy: Secondary | ICD-10-CM | POA: Diagnosis not present

## 2023-12-09 LAB — CBC WITH DIFFERENTIAL/PLATELET
Abs Immature Granulocytes: 0.01 10*3/uL (ref 0.00–0.07)
Basophils Absolute: 0.1 10*3/uL (ref 0.0–0.1)
Basophils Relative: 2 %
Eosinophils Absolute: 0.2 10*3/uL (ref 0.0–0.5)
Eosinophils Relative: 5 %
HCT: 32.1 % — ABNORMAL LOW (ref 39.0–52.0)
Hemoglobin: 10.2 g/dL — ABNORMAL LOW (ref 13.0–17.0)
Immature Granulocytes: 0 %
Lymphocytes Relative: 23 %
Lymphs Abs: 1.1 10*3/uL (ref 0.7–4.0)
MCH: 34.1 pg — ABNORMAL HIGH (ref 26.0–34.0)
MCHC: 31.8 g/dL (ref 30.0–36.0)
MCV: 107.4 fL — ABNORMAL HIGH (ref 80.0–100.0)
Monocytes Absolute: 0.7 10*3/uL (ref 0.1–1.0)
Monocytes Relative: 15 %
Neutro Abs: 2.7 10*3/uL (ref 1.7–7.7)
Neutrophils Relative %: 55 %
Platelets: 240 10*3/uL (ref 150–400)
RBC: 2.99 MIL/uL — ABNORMAL LOW (ref 4.22–5.81)
RDW: 14.4 % (ref 11.5–15.5)
WBC: 4.9 10*3/uL (ref 4.0–10.5)
nRBC: 0 % (ref 0.0–0.2)

## 2023-12-09 LAB — PROTEIN ELECTROPHORESIS, SERUM
A/G Ratio: 1.9 — ABNORMAL HIGH (ref 0.7–1.7)
Albumin ELP: 3.1 g/dL (ref 2.9–4.4)
Alpha-1-Globulin: 0.3 g/dL (ref 0.0–0.4)
Alpha-2-Globulin: 0.5 g/dL (ref 0.4–1.0)
Beta Globulin: 0.7 g/dL (ref 0.7–1.3)
Gamma Globulin: 0.1 g/dL — ABNORMAL LOW (ref 0.4–1.8)
Globulin, Total: 1.6 g/dL — ABNORMAL LOW (ref 2.2–3.9)
Total Protein ELP: 4.7 g/dL — ABNORMAL LOW (ref 6.0–8.5)

## 2023-12-09 MED ORDER — ACETAMINOPHEN 325 MG PO TABS
650.0000 mg | ORAL_TABLET | Freq: Once | ORAL | Status: DC
Start: 2023-12-09 — End: 2023-12-09

## 2023-12-09 MED ORDER — DARATUMUMAB-HYALURONIDASE-FIHJ 1800-30000 MG-UT/15ML ~~LOC~~ SOLN
1800.0000 mg | Freq: Once | SUBCUTANEOUS | Status: AC
  Administered 2023-12-09: 1800 mg via SUBCUTANEOUS
  Filled 2023-12-09: qty 15

## 2023-12-09 MED ORDER — DEXAMETHASONE 4 MG PO TABS: 20.0000 mg | ORAL_TABLET | Freq: Once | ORAL | Status: DC

## 2023-12-09 MED ORDER — AMOXICILLIN-POT CLAVULANATE 875-125 MG PO TABS: 1.0000 | ORAL_TABLET | Freq: Two times a day (BID) | ORAL | 0 refills | Status: DC

## 2023-12-09 MED ORDER — LORATADINE 10 MG PO TABS
10.0000 mg | ORAL_TABLET | Freq: Once | ORAL | Status: DC
  Filled 2023-12-09: qty 1

## 2023-12-09 NOTE — Patient Instructions (Signed)

## 2023-12-09 NOTE — Patient Instructions (Signed)
[facility] [place] - A DEPT OF [facility]  Discharge Instructions: Thank you for choosing [facility] to provide your oncology and hematology care.  If you have a lab appointment with the Cancer Center - please note that after April 8th, 2024, all labs will be drawn in the cancer center.  You do not have to check in or register with the main entrance as you have in the past but will complete your check-in in the cancer center.  Wear comfortable clothing and clothing appropriate for easy access to any Portacath or PICC line.   We strive to give you quality time with your provider. You may need to reschedule your appointment if you arrive late (15 or more minutes).  Arriving late affects you and other patients whose appointments are after yours.  Also, if you miss three or more appointments without notifying the office, you may be dismissed from the clinic at the provider's discretion.      For prescription refill requests, have your pharmacy contact our office and allow 72 hours for refills to be completed.    Today you received the following chemotherapy and/or immunotherapy agents darzalex    To help prevent nausea and vomiting after your treatment, we encourage you to take your nausea medication as directed.  BELOW ARE SYMPTOMS THAT SHOULD BE REPORTED IMMEDIATELY: *FEVER GREATER THAN 100.4 F (38 C) OR HIGHER *CHILLS OR SWEATING *NAUSEA AND VOMITING THAT IS NOT CONTROLLED WITH YOUR NAUSEA MEDICATION *UNUSUAL SHORTNESS OF BREATH *UNUSUAL BRUISING OR BLEEDING *URINARY PROBLEMS (pain or burning when urinating, or frequent urination) *BOWEL PROBLEMS (unusual diarrhea, constipation, pain near the anus) TENDERNESS IN MOUTH AND THROAT WITH OR WITHOUT PRESENCE OF ULCERS (sore throat, sores in mouth, or a toothache) UNUSUAL RASH, SWELLING OR PAIN  UNUSUAL VAGINAL DISCHARGE OR ITCHING   Items with * indicate a potential emergency and should be followed up as  soon as possible or go to the Emergency Department if any problems should occur.  Please show the CHEMOTHERAPY ALERT CARD or IMMUNOTHERAPY ALERT CARD at check-in to the Emergency Department and triage nurse.  Should you have questions after your visit or need to cancel or reschedule your appointment, please contact [facility] [place] - A DEPT OF [facility]igh[facility] [phone]  and follow the prompts.  Office hours are 8:00 a.m. to 4:30 p.m. Monday - Friday. Please note that voicemails left after 4:00 p.m. may not be returned until the following business day.  We are closed weekends and major holidays. You have access to a nurse at all times for urgent questions. Please call the main number to the clinic [phone] and follow the prompts.  For any non-urgent questions, you may also contact your provider using MyChart. We now offer e-Visits for anyone 2 and older to request care online for non-urgent symptoms. For details visit [email or web address].   Also download the MyChart app! Go to the app store, search "MyChart", open the app, select [place], and log in with your MyChart username and password.

## 2023-12-09 NOTE — Progress Notes (Signed)
Patient tolerated  Darzalex injection with no complaints voiced.  Site clean and dry with no bruising or swelling noted at site.  See MAR for details.  Band aid applied.  Patient stable during and after injection.  Vss with discharge and left in satisfactory condition with no s/s of distress noted. All follow ups as scheduled.   Miyuki Rzasa [employer]

## 2023-12-09 NOTE — Progress Notes (Signed)
Holding Rivka Barbara today per MD.  Richardean Sale, RPH, BCPS, BCOP 12/09/2023 11:44 AM

## 2023-12-09 NOTE — Progress Notes (Signed)
Patient is taking Pomalyst as prescribed.  He has not missed any doses and reports no side effects at this time.   

## 2023-12-10 ENCOUNTER — Inpatient Hospital Stay: Payer: Medicare Other

## 2023-12-11 ENCOUNTER — Other Ambulatory Visit: Payer: Self-pay

## 2023-12-13 ENCOUNTER — Other Ambulatory Visit: Payer: Self-pay

## 2023-12-13 DIAGNOSIS — G4700 Insomnia, unspecified: Secondary | ICD-10-CM

## 2023-12-13 DIAGNOSIS — I10 Essential (primary) hypertension: Secondary | ICD-10-CM

## 2023-12-13 DIAGNOSIS — D485 Neoplasm of uncertain behavior of skin: Secondary | ICD-10-CM | POA: Diagnosis not present

## 2023-12-13 DIAGNOSIS — L988 Other specified disorders of the skin and subcutaneous tissue: Secondary | ICD-10-CM | POA: Diagnosis not present

## 2023-12-13 LAB — IMMUNOFIXATION ELECTROPHORESIS
IgA: 7 mg/dL — ABNORMAL LOW (ref 61–437)
IgG (Immunoglobin G), Serum: 183 mg/dL — ABNORMAL LOW (ref 603–1613)
IgM (Immunoglobulin M), Srm: <5 mg/dL — ABNORMAL LOW (ref 15–143)
Total Protein ELP: 4.8 g/dL — ABNORMAL LOW (ref 6.0–8.5)

## 2023-12-13 MED ORDER — POMALIDOMIDE 2 MG PO CAPS: ORAL_CAPSULE | ORAL | 0 refills | Status: DC

## 2023-12-13 NOTE — Telephone Encounter (Signed)
Chart reviewed. Pomalyst refilled per last office note with Dr. Katragadda.  

## 2023-12-21 ENCOUNTER — Encounter: Payer: Self-pay | Admitting: Hematology

## 2023-12-21 ENCOUNTER — Encounter (HOSPITAL_COMMUNITY): Payer: Self-pay | Admitting: Hematology

## 2024-01-02 ENCOUNTER — Telehealth: Payer: Self-pay

## 2024-01-02 NOTE — Telephone Encounter (Signed)
 Oral Oncology Patient Advocate Encounter  Was successful in securing patient a $12,000.00 grant from [facility] to provide copayment coverage for Pomalyst .  This will keep the out of pocket expense at $0.     [employer] ID: [id number]   The billing information is as follows and has been shared with [facility].    RxBin: [id number] PCN: [id number] Member ID: [id number] Group ID: [id number] Dates of Eligibility: 10/08/23 through 10/06/24  Fund:  Multiple Myeloma - Medicare Access   Morene Potters, CPhT Oncology Pharmacy Patient Advocate  [facility]  [phone] (phone) [phone] (fax)

## 2024-01-05 ENCOUNTER — Encounter: Payer: Self-pay | Admitting: Hematology

## 2024-01-06 ENCOUNTER — Inpatient Hospital Stay: Payer: Medicare Other

## 2024-01-06 ENCOUNTER — Inpatient Hospital Stay: Payer: Medicare Other | Attending: Hematology

## 2024-01-06 ENCOUNTER — Inpatient Hospital Stay: Payer: Medicare Other | Admitting: Hematology

## 2024-01-06 ENCOUNTER — Ambulatory Visit: Payer: Medicare Other

## 2024-01-06 VITALS — BP 131/67 | HR 59 | Temp 98.6°F | Resp 20 | Wt 198.2 lb

## 2024-01-06 DIAGNOSIS — Z5112 Encounter for antineoplastic immunotherapy: Secondary | ICD-10-CM | POA: Insufficient documentation

## 2024-01-06 DIAGNOSIS — C9000 Multiple myeloma not having achieved remission: Secondary | ICD-10-CM | POA: Diagnosis not present

## 2024-01-06 LAB — COMPREHENSIVE METABOLIC PANEL
ALT: 11 U/L (ref 0–44)
AST: 13 U/L — ABNORMAL LOW (ref 15–41)
Albumin: 3.2 g/dL — ABNORMAL LOW (ref 3.5–5.0)
Alkaline Phosphatase: 30 U/L — ABNORMAL LOW (ref 38–126)
Anion gap: 6 (ref 5–15)
BUN: 12 mg/dL (ref 8–23)
CO2: 23 mmol/L (ref 22–32)
Calcium: 8.2 mg/dL — ABNORMAL LOW (ref 8.9–10.3)
Chloride: 106 mmol/L (ref 98–111)
Creatinine, Ser: 1.23 mg/dL (ref 0.61–1.24)
GFR, Estimated: >60 mL/min (ref >60)
Glucose, Bld: 101 mg/dL — ABNORMAL HIGH (ref 70–99)
Potassium: 4.1 mmol/L (ref 3.5–5.1)
Sodium: 135 mmol/L (ref 135–145)
Total Bilirubin: 0.6 mg/dL (ref 0.0–1.2)
Total Protein: 5.3 g/dL — ABNORMAL LOW (ref 6.5–8.1)

## 2024-01-06 LAB — CBC WITH DIFFERENTIAL/PLATELET
Abs Immature Granulocytes: 0 10*3/uL (ref 0.00–0.07)
Basophils Absolute: 0.1 10*3/uL (ref 0.0–0.1)
Basophils Relative: 2 %
Eosinophils Absolute: 0.3 10*3/uL (ref 0.0–0.5)
Eosinophils Relative: 7 %
HCT: 31.5 % — ABNORMAL LOW (ref 39.0–52.0)
Hemoglobin: 10.2 g/dL — ABNORMAL LOW (ref 13.0–17.0)
Immature Granulocytes: 0 %
Lymphocytes Relative: 24 %
Lymphs Abs: 1 10*3/uL (ref 0.7–4.0)
MCH: 34.5 pg — ABNORMAL HIGH (ref 26.0–34.0)
MCHC: 32.4 g/dL (ref 30.0–36.0)
MCV: 106.4 fL — ABNORMAL HIGH (ref 80.0–100.0)
Monocytes Absolute: 0.6 10*3/uL (ref 0.1–1.0)
Monocytes Relative: 16 %
Neutro Abs: 2.1 10*3/uL (ref 1.7–7.7)
Neutrophils Relative %: 51 %
Platelets: 175 10*3/uL (ref 150–400)
RBC: 2.96 MIL/uL — ABNORMAL LOW (ref 4.22–5.81)
RDW: 15 % (ref 11.5–15.5)
WBC: 4.1 10*3/uL (ref 4.0–10.5)
nRBC: 0 % (ref 0.0–0.2)

## 2024-01-06 MED ORDER — HEPARIN SOD (PORK) LOCK FLUSH 100 UNIT/ML IV SOLN
500.0000 [IU] | Freq: Once | INTRAVENOUS | Status: AC
Start: 2024-01-06 — End: 2024-01-06
  Administered 2024-01-06: 500 [IU] via INTRAVENOUS

## 2024-01-06 MED ORDER — DARATUMUMAB-HYALURONIDASE-FIHJ 1800-30000 MG-UT/15ML ~~LOC~~ SOLN
1800.0000 mg | Freq: Once | SUBCUTANEOUS | Status: AC
  Administered 2024-01-06: 1800 mg via SUBCUTANEOUS
  Filled 2024-01-06: qty 15

## 2024-01-06 MED ORDER — SODIUM CHLORIDE 0.9% FLUSH
10.0000 mL | Freq: Once | INTRAVENOUS | Status: AC
  Administered 2024-01-06: 10 mL via INTRAVENOUS

## 2024-01-06 NOTE — Progress Notes (Signed)
 Patient presents today for Darzalex  Faspro injection. Vital signs and labs within parameters for treatment. Patient took pre-medications prior to arrival per patient's words.   Treatment given today per MD orders. Tolerated without adverse affects. Vital signs stable. No complaints at this time. Discharged from clinic ambulatory in stable condition. Alert and oriented x 3. F/U with [facility] as scheduled.

## 2024-01-06 NOTE — Patient Instructions (Signed)
 [facility] [place] - A DEPT OF [facility]  Discharge Instructions: Thank you for choosing [facility] to provide your oncology and hematology care.  If you have a lab appointment with the Cancer Center - please note that after April 8th, 2024, all labs will be drawn in the cancer center.  You do not have to check in or register with the main entrance as you have in the past but will complete your check-in in the cancer center.  Wear comfortable clothing and clothing appropriate for easy access to any Portacath or PICC line.   We strive to give you quality time with your provider. You may need to reschedule your appointment if you arrive late (15 or more minutes).  Arriving late affects you and other patients whose appointments are after yours.  Also, if you miss three or more appointments without notifying the office, you may be dismissed from the clinic at the provider's discretion.      For prescription refill requests, have your pharmacy contact our office and allow 72 hours for refills to be completed.    Today you received the following chemotherapy and/or immunotherapy agents Darzalex Faspro. Daratumumab; Hyaluronidase Injection What is this medication? DARATUMUMAB; HYALURONIDASE (dar a toom ue mab; hye al ur ON i dase) treats multiple myeloma, a type of bone marrow cancer. Daratumumab works by blocking a protein that causes cancer cells to grow and multiply. This helps to slow or stop the spread of cancer cells. Hyaluronidase works by increasing the absorption of other medications in the body to help them work better. This medication may also be used treat amyloidosis, a condition that causes the buildup of a protein (amyloid) in your body. It works by reducing the buildup of this protein, which decreases symptoms. It is a combination medication that contains a monoclonal antibody. This medicine may be used for other purposes; ask your health care  provider or pharmacist if you have questions. COMMON BRAND NAME(S): DARZALEX FASPRO What should I tell my care team before I take this medication? They need to know if you have any of these conditions: Heart disease Infection, such as chickenpox, cold sores, herpes, hepatitis B Lung or breathing disease An unusual or allergic reaction to daratumumab, hyaluronidase, other medications, foods, dyes, or preservatives Pregnant or trying to get pregnant Breast-feeding How should I use this medication? This medication is injected under the skin. It is given by your care team in a hospital or clinic setting. Talk to your care team about the use of this medication in children. Special care may be needed. Overdosage: If you think you have taken too much of this medicine contact a poison control center or emergency room at once. NOTE: This medicine is only for you. Do not share this medicine with others. What if I miss a dose? Keep appointments for follow-up doses. It is important not to miss your dose. Call your care team if you are unable to keep an appointment. What may interact with this medication? Interactions have not been studied. This list may not describe all possible interactions. Give your health care provider a list of all the medicines, herbs, non-prescription drugs, or dietary supplements you use. Also tell them if you smoke, drink alcohol, or use illegal drugs. Some items may interact with your medicine. What should I watch for while using this medication? Your condition will be monitored carefully while you are receiving this medication. This medication can cause serious allergic  reactions. To reduce your risk, your care team may give you other medication to take before receiving this one. Be sure to follow the directions from your care team. This medication can affect the results of blood tests to match your blood type. These changes can last for up to 6 months after the final dose.  Your care team will do blood tests to match your blood type before you start treatment. Tell all of your care team that you are being treated with this medication before receiving a blood transfusion. This medication can affect the results of some tests used to determine treatment response; extra tests may be needed to evaluate response. Talk to your care team if you wish to become pregnant or think you are pregnant. This medication can cause serious birth defects if taken during pregnancy and for 3 months after the last dose. A reliable form of contraception is recommended while taking this medication and for 3 months after the last dose. Talk to your care team about effective forms of contraception. Do not breast-feed while taking this medication. What side effects may I notice from receiving this medication? Side effects that you should report to your care team as soon as possible: Allergic reactions--skin rash, itching, hives, swelling of the face, lips, tongue, or throat Heart rhythm changes--fast or irregular heartbeat, dizziness, feeling faint or lightheaded, chest pain, trouble breathing Infection--fever, chills, cough, sore throat, wounds that don't heal, pain or trouble when passing urine, general feeling of discomfort or being unwell Infusion reactions--chest pain, shortness of breath or trouble breathing, feeling faint or lightheaded Sudden eye pain or change in vision such as blurry vision, seeing halos around lights, vision loss Unusual bruising or bleeding Side effects that usually do not require medical attention (report to your care team if they continue or are bothersome): Constipation Diarrhea Fatigue Nausea Pain, tingling, or numbness in the hands or feet Swelling of the ankles, hands, or feet This list may not describe all possible side effects. Call your doctor for medical advice about side effects. You may report side effects to FDA at 1-800-FDA-1088. Where should I keep my  medication? This medication is given in a hospital or clinic. It will not be stored at home. NOTE: This sheet is a summary. It may not cover all possible information. If you have questions about this medicine, talk to your doctor, pharmacist, or health care provider.  2024 Elsevier/Gold Standard (2022-04-21 00:00:00)      To help prevent nausea and vomiting after your treatment, we encourage you to take your nausea medication as directed.  BELOW ARE SYMPTOMS THAT SHOULD BE REPORTED IMMEDIATELY: *FEVER GREATER THAN 100.4 F (38 C) OR HIGHER *CHILLS OR SWEATING *NAUSEA AND VOMITING THAT IS NOT CONTROLLED WITH YOUR NAUSEA MEDICATION *UNUSUAL SHORTNESS OF BREATH *UNUSUAL BRUISING OR BLEEDING *URINARY PROBLEMS (pain or burning when urinating, or frequent urination) *BOWEL PROBLEMS (unusual diarrhea, constipation, pain near the anus) TENDERNESS IN MOUTH AND THROAT WITH OR WITHOUT PRESENCE OF ULCERS (sore throat, sores in mouth, or a toothache) UNUSUAL RASH, SWELLING OR PAIN  UNUSUAL VAGINAL DISCHARGE OR ITCHING   Items with * indicate a potential emergency and should be followed up as soon as possible or go to the Emergency Department if any problems should occur.  Please show the CHEMOTHERAPY ALERT CARD or IMMUNOTHERAPY ALERT CARD at check-in to the Emergency Department and triage nurse.  Should you have questions after your visit or need to cancel or reschedule your appointment, please contact [facility] CANCER  CTR [place] - A DEPT OF Eligh[facility] Bridegroom [facility] [phone]  and follow the prompts.  Office hours are 8:00 a.m. to 4:30 p.m. Monday - Friday. Please note that voicemails left after 4:00 p.m. may not be returned until the following business day.  We are closed weekends and major holidays. You have access to a nurse at all times for urgent questions. Please call the main number to the clinic [phone] and follow the prompts.  For any non-urgent questions, you may also contact your  provider using MyChart. We now offer e-Visits for anyone 18 and older to request care online for non-urgent symptoms. For details visit [email or web address].   Also download the MyChart app! Go to the app store, search "MyChart", open the app, select [facility], and log in with your MyChart username and password.

## 2024-01-06 NOTE — Progress Notes (Signed)
 Premeds taken at home per nurse.  Pryor Ochoa, PharmD

## 2024-01-07 ENCOUNTER — Inpatient Hospital Stay: Payer: Medicare Other

## 2024-01-07 NOTE — Progress Notes (Deleted)
 Messaged Dr. Ellin Saba to verify if we were still holding xgeva. Will hold it till pt has office visit with MD next time per MD.

## 2024-01-13 ENCOUNTER — Other Ambulatory Visit: Payer: Self-pay

## 2024-01-13 DIAGNOSIS — I10 Essential (primary) hypertension: Secondary | ICD-10-CM

## 2024-01-13 DIAGNOSIS — G4700 Insomnia, unspecified: Secondary | ICD-10-CM

## 2024-01-13 MED ORDER — POMALIDOMIDE 2 MG PO CAPS: ORAL_CAPSULE | ORAL | 0 refills | Status: DC

## 2024-01-13 NOTE — Telephone Encounter (Signed)
Chart reviewed. Pomalyst refilled per verbal order from Dr. Ellin Saba.

## 2024-01-24 DIAGNOSIS — Z08 Encounter for follow-up examination after completed treatment for malignant neoplasm: Secondary | ICD-10-CM | POA: Diagnosis not present

## 2024-01-24 DIAGNOSIS — Z85828 Personal history of other malignant neoplasm of skin: Secondary | ICD-10-CM | POA: Diagnosis not present

## 2024-01-24 DIAGNOSIS — L905 Scar conditions and fibrosis of skin: Secondary | ICD-10-CM | POA: Diagnosis not present

## 2024-01-24 DIAGNOSIS — C4441 Basal cell carcinoma of skin of scalp and neck: Secondary | ICD-10-CM | POA: Diagnosis not present

## 2024-01-24 DIAGNOSIS — L570 Actinic keratosis: Secondary | ICD-10-CM | POA: Diagnosis not present

## 2024-01-24 DIAGNOSIS — X32XXXD Exposure to sunlight, subsequent encounter: Secondary | ICD-10-CM | POA: Diagnosis not present

## 2024-01-28 ENCOUNTER — Inpatient Hospital Stay: Payer: Medicare Other

## 2024-01-28 DIAGNOSIS — Z5112 Encounter for antineoplastic immunotherapy: Secondary | ICD-10-CM | POA: Diagnosis not present

## 2024-01-28 DIAGNOSIS — C9000 Multiple myeloma not having achieved remission: Secondary | ICD-10-CM | POA: Diagnosis not present

## 2024-01-28 DIAGNOSIS — C7951 Secondary malignant neoplasm of bone: Secondary | ICD-10-CM

## 2024-01-28 LAB — COMPREHENSIVE METABOLIC PANEL
ALT: 11 U/L (ref 0–44)
AST: 11 U/L — ABNORMAL LOW (ref 15–41)
Albumin: 3.2 g/dL — ABNORMAL LOW (ref 3.5–5.0)
Alkaline Phosphatase: 34 U/L — ABNORMAL LOW (ref 38–126)
Anion gap: 9 (ref 5–15)
BUN: 14 mg/dL (ref 8–23)
CO2: 23 mmol/L (ref 22–32)
Calcium: 9.3 mg/dL (ref 8.9–10.3)
Chloride: 105 mmol/L (ref 98–111)
Creatinine, Ser: 1.36 mg/dL — ABNORMAL HIGH (ref 0.61–1.24)
GFR, Estimated: 54 mL/min — ABNORMAL LOW (ref >60)
Glucose, Bld: 99 mg/dL (ref 70–99)
Potassium: 4.5 mmol/L (ref 3.5–5.1)
Sodium: 137 mmol/L (ref 135–145)
Total Bilirubin: 0.6 mg/dL (ref 0.0–1.2)
Total Protein: 5.5 g/dL — ABNORMAL LOW (ref 6.5–8.1)

## 2024-01-28 LAB — CBC WITH DIFFERENTIAL/PLATELET
Abs Immature Granulocytes: 0.01 10*3/uL (ref 0.00–0.07)
Basophils Absolute: 0.1 10*3/uL (ref 0.0–0.1)
Basophils Relative: 2 %
Eosinophils Absolute: 0.2 10*3/uL (ref 0.0–0.5)
Eosinophils Relative: 4 %
HCT: 30.7 % — ABNORMAL LOW (ref 39.0–52.0)
Hemoglobin: 10.1 g/dL — ABNORMAL LOW (ref 13.0–17.0)
Immature Granulocytes: 0 %
Lymphocytes Relative: 35 %
Lymphs Abs: 1.4 10*3/uL (ref 0.7–4.0)
MCH: 34.4 pg — ABNORMAL HIGH (ref 26.0–34.0)
MCHC: 32.9 g/dL (ref 30.0–36.0)
MCV: 104.4 fL — ABNORMAL HIGH (ref 80.0–100.0)
Monocytes Absolute: 0.8 10*3/uL (ref 0.1–1.0)
Monocytes Relative: 21 %
Neutro Abs: 1.5 10*3/uL — ABNORMAL LOW (ref 1.7–7.7)
Neutrophils Relative %: 38 %
Platelets: 168 10*3/uL (ref 150–400)
RBC: 2.94 MIL/uL — ABNORMAL LOW (ref 4.22–5.81)
RDW: 14.2 % (ref 11.5–15.5)
WBC: 4 10*3/uL (ref 4.0–10.5)
nRBC: 0 % (ref 0.0–0.2)

## 2024-01-28 MED ORDER — SODIUM CHLORIDE 0.9% FLUSH
10.0000 mL | Freq: Once | INTRAVENOUS | Status: AC
  Administered 2024-01-28: 10 mL via INTRAVENOUS

## 2024-01-28 MED ORDER — HEPARIN SOD (PORK) LOCK FLUSH 100 UNIT/ML IV SOLN
500.0000 [IU] | Freq: Once | INTRAVENOUS | Status: AC
  Administered 2024-01-28: 500 [IU] via INTRAVENOUS

## 2024-01-28 NOTE — Progress Notes (Signed)
 Patients port flushed without difficulty.  Good blood return noted with no bruising or swelling noted at site.  Band aid applied.  VSS with discharge and left in satisfactory condition with no s/s of distress noted.

## 2024-01-28 NOTE — Patient Instructions (Signed)
 [facility] [place] [facility]  Discharge Instructions: Thank you for choosing [facility] to provide your oncology and hematology care.  If you have a lab appointment with the Cancer Center - please note that after April 8th, 2024, all labs will be drawn in the cancer center.  You do not have to check in or register with the main entrance as you have in the past but will complete your check-in in the cancer center.  Wear comfortable clothing and clothing appropriate for easy access to any Portacath or PICC line.   We strive to give you quality time with your provider. You may need to reschedule your appointment if you arrive late (15 or more minutes).  Arriving late affects you and other patients whose appointments are after yours.  Also, if you miss three or more appointments without notifying the office, you may be dismissed from the clinic at the provider's discretion.      For prescription refill requests, have your pharmacy contact our office and allow 72 hours for refills to be completed.    Today you received the following chemotherapy and/or immunotherapy agents Port flush labs      To help prevent nausea and vomiting after your treatment, we encourage you to take your nausea medication as directed.  BELOW ARE SYMPTOMS THAT SHOULD BE REPORTED IMMEDIATELY: *FEVER GREATER THAN 100.4 F (38 C) OR HIGHER *CHILLS OR SWEATING *NAUSEA AND VOMITING THAT IS NOT CONTROLLED WITH YOUR NAUSEA MEDICATION *UNUSUAL SHORTNESS OF BREATH *UNUSUAL BRUISING OR BLEEDING *URINARY PROBLEMS (pain or burning when urinating, or frequent urination) *BOWEL PROBLEMS (unusual diarrhea, constipation, pain near the anus) TENDERNESS IN MOUTH AND THROAT WITH OR WITHOUT PRESENCE OF ULCERS (sore throat, sores in mouth, or a toothache) UNUSUAL RASH, SWELLING OR PAIN  UNUSUAL VAGINAL DISCHARGE OR ITCHING   Items with * indicate a potential emergency and should be  followed up as soon as possible or go to the Emergency Department if any problems should occur.  Please show the CHEMOTHERAPY ALERT CARD or IMMUNOTHERAPY ALERT CARD at check-in to the Emergency Department and triage nurse.  Should you have questions after your visit or need to cancel or reschedule your appointment, please contact [facility] CANCER CTR [place] - A DEPT OF [facility] Bride[facility] [phone]  and follow the prompts.  Office hours are 8:00 a.m. to 4:30 p.m. Monday - Friday. Please note that voicemails left after 4:00 p.m. may not be returned until the following business day.  We are closed weekends and major holidays. You have access to a nurse at all times for urgent questions. Please call the main number to the clinic [phone] and follow the prompts.  For any non-urgent questions, you may also contact your provider using MyChart. We now offer e-Visits for anyone 68 and older to request care online for non-urgent symptoms. For details visit [email or web address].   Also download the MyChart app! Go to the app store, search "MyChart", open the app, select [facility], and log in with your MyChart username and password.

## 2024-01-31 LAB — KAPPA/LAMBDA LIGHT CHAINS
Kappa free light chain: 2.1 mg/L — ABNORMAL LOW (ref 3.3–19.4)
Kappa, lambda light chain ratio: 1.05 (ref 0.26–1.65)
Lambda free light chains: 2 mg/L — ABNORMAL LOW (ref 5.7–26.3)

## 2024-02-02 LAB — IMMUNOFIXATION ELECTROPHORESIS
IgA: 7 mg/dL — ABNORMAL LOW (ref 61–437)
IgG (Immunoglobin G), Serum: 196 mg/dL — ABNORMAL LOW (ref 603–1613)
IgM (Immunoglobulin M), Srm: <5 mg/dL — ABNORMAL LOW (ref 15–143)
Total Protein ELP: 4.8 g/dL — ABNORMAL LOW (ref 6.0–8.5)

## 2024-02-02 NOTE — Progress Notes (Signed)
 [facility] [address] [name]    Clinic Day:  02/03/2024  Referring physician: Rogers Hai, MD  Patient Care Team: Rogers Hai, MD as PCP - General (Hematology) Rogers Hai, MD as Consulting Physician (Oncology)   ASSESSMENT & PLAN:   Assessment: 1.  Relapsed multiple myeloma: -Autologous stem cell transplant on 10/08/2017 -Post transplant consolidation with CyCarD for 2 cycles with M spike undetectable. -Maintenance therapy with carfilzomib  70 mg per metered square days 1 and 15 every 28 days from 04/20/2018, dose reduced to 35 mg per metered square on 01/05/2019, titrated up to 56 mg per metered square on 01/09/2020. 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 -PET scan on 02/18/2020 showed new left supraclavicular lymph node at the thoracic inlet, SUV 14.2.  Multifocal osseous lesions largely improved, though residual lesions noted including right acromion, left acromion, thyroid  cartilage, right sternum.  Additional lesions throughout the sternum, bilateral ribs, thoracolumbar spine, bilateral pelvis have resolved. -BMBX on 02/14/2020 shows plasma cell myeloma, 60-70% of cells.  FISH for myeloma was negative.  Cytogenetics was normal. -4 cycles of daratumumab , bortezomib  and dexamethasone  from 02/28/2020 through 04/29/2020. -Daratumumab , pomalidomide  and dexamethasone  started on 05/28/2020.  He is receiving daratumumab  every 2 weeks, Pomalyst  3 mg days 1-21, dexamethasone  20 mg weekly. -Pomalidomide  dose reduced to 2 mg days 1-21 around the first week of July 2021, due to severe weakness.    Plan: 1.  Relapsed IgG kappa multiple myeloma: - He is tolerating pomalidomide  and Darzalex  well.  He takes dexamethasone  on days of Darzalex  injection. - Reviewed myeloma labs from 01/28/2024: M spike is stable at 0.1 g.  Free light chain ratio is normal at 1.05.  Immunofixation shows IgG kappa. -  Continue pomalidomide  2 mg 3 weeks on/1 week off.  He may proceed with Darzalex  today. - He reported numbness/tingling in the right maxillary sinus area worse in the last 1 week.  He had this numbness on and off since he had sinus surgery in January 2024.  He reports occasional pain in the right maxillary sinus area.  He also reported worsening of frontal headaches. - Will order CT maxillofacial sinuses with contrast.  He has follow-up with ENT on 02/14/2024. - RTC 4 weeks for follow-up.   2.  Myeloma bone disease: - Will hold denosumab  today.   3.  Macrocytic anemia: - From CKD and functional iron deficiency and myelosuppression.  Hemoglobin is stable at 10.1.  Will give Retacrit  if hemoglobin drops below 9.   4.  ID prophylaxis: - On acyclovir  twice daily.   5.  Peripheral neuropathy: - Continue gabapentin  600 mg 3 times daily.   6.  History of breast cancer: - Continue tamoxifen  daily.  7.  Hypogammaglobulinemia: - IgG levels are 196.  Will consider IVIG if recurrent infections.    Orders Placed This Encounter  Procedures   CT MAXILLOFACIAL W & WO CONTRAST    Standing Status:   Future    Expected Date:   02/10/2024    Expiration Date:   02/02/2025    If indicated for the ordered procedure, I authorize the administration of contrast media per Radiology protocol:   Yes    Does the patient have a contrast media/X-ray dye allergy?:   No    Preferred imaging location?:   [facility] R Teague,acting as a scribe for Hai Rogers, MD.,have documented all relevant documentation on the  behalf of Alean Stands, MD,as directed by  Alean Stands, MD while in the presence of Alean Stands, MD.  I, Alean Stands MD, have reviewed the above documentation for accuracy and completeness, and I agree with the above.      Alean Stands, MD   2/6/20255:17 PM  CHIEF COMPLAINT:   Diagnosis: multiple myeloma    Cancer Staging   Multiple myeloma ([name] Staging form: Plasma Cell Myeloma and Plasma Cell Disorders, AJCC 8th Edition - Clinical stage from 05/03/2017: Beta-2 -microglobulin (mg/L): 3.5, Albumin  (g/dL): 3.4, ISS: Stage II, High-risk cytogenetics: Absent, LDH: Not assessed - Signed by Berry Debby RAMAN, PA-C on 05/03/2017    Prior Therapy: 1. RVD x 6 cycles from 03/01/2017 to 06/29/2017. 2. Stem cell transplant on 09/30/2017. 3. Carfilzomib  and cyclophosphamide  x 2 cycles from 02/14/2018 to 03/29/2018. 4. Carfilzomib  x 22 cycles from 04/20/2018 to 01/23/2020.  Current Therapy:  Darzalex  Faspro monthly; Pomalyst  2 mg 3/4 weeks; Xgeva  monthly    HISTORY OF PRESENT ILLNESS:   Oncology History  Breast cancer, male ([name]  12/31/2009 Initial Biopsy   Biopsy of L breast    12/31/2009 Pathology Results   Invasive ductal carcinoma, ER/PR+, HER 2 negative   12/31/2009 Imaging   Ultrasound showing a 2.43 x 1.85 x 3 cm hypoechoic spiculated mass in the 12 o clock L breast retroareolar region   01/01/2010 -  Anti-estrogen oral therapy   Tamoxifen  20 mg daily   01/06/2010 Imaging   Bone scan abnormal uptake in the diaphysis of the R humerus, abnormal in the R third, fifth and sixth ribs, lesion also noted in the sternum.   02/03/2010 Surgery   Rod placement and fixation of R humerus by Dr. Camella   02/05/2010 - 02/18/2010 Radiation Therapy   30Gy in 10 fractions of 3 Gy per fraction to R pathologic fracture   03/11/2010 -  Chemotherapy   Denosumab  monthly, now every 3 months. Started at [facility]    06/09/2016 Imaging   Three hypermetabolic osseous lesions in the sternum, left ilium and right ilium, as discussed above, likely represent osseous metastases. At this time, these are not recognizable on the CT images. 2. No extra skeletal metastatic disease identified in the neck, chest, abdomen or pelvis.   10/13/2016 Progression   PET shows various new and enlarging osseous metastatic lesions with no definite extra osseous  metastatic disease currently identified.    12/31/2016 Progression   1. Multifocal hypermetabolic osseous metastases throughout the axial and proximal appendicular skeleton, which are increased in size, number and metabolism since 10/13/2016 PET-CT. 2. 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 3. No additional sites of hypermetabolic metastatic disease. 4. Chronic right mastoid sinusitis. 5. Aortic atherosclerosis.  One vessel coronary atherosclerosis.   Multiple myeloma ([name]  02/12/2017 Bone Marrow Biopsy   The marrow was variably cellular with large peritrabecular aggregates of kappa restricted plasma cells (66% by aspirate, 30% by Cd138). Cytogenetics +11.    03/01/2017 - 06/29/2017 Chemotherapy   RVD    05/26/2017 Bone Marrow Biopsy   Performed at [facility]:  Plasma cell myeloma in a 30% cellular marrow with decreased trilineage hematopoiesis and 42% kappa light chain restricted plasma cells on the aspirate smears and large aggregates on the core biopsy.     07/12/2017 - 09/01/2017 Chemotherapy   3 cycles of carfizolmib/cyclophosphamide /dexamethasone      10/08/2017 Bone Marrow Transplant   Autotransplant at [facility]   02/28/2020 - 09/01/2022 Chemotherapy   Patient is on Treatment Plan : Daratumumab  q28d     09/01/2022 -  Chemotherapy   Patient is on Treatment Plan : MYELOMA Daratumumab  Faspro Subq q28d     Multiple myeloma not having achieved remission ([name]  06/29/2017 Initial Diagnosis   Multiple myeloma not having achieved remission ([name]   02/28/2020 - 09/01/2022 Chemotherapy   Patient is on Treatment Plan : Daratumumab  q28d     09/01/2022 -  Chemotherapy   Patient is on Treatment Plan : MYELOMA Daratumumab  Faspro Subq q28d        INTERVAL HISTORY:   Johnny Lucas is a 78 y.o. male presenting to clinic today for follow up of multiple myeloma.  He was last seen by me on 12/09/23.  Today, he states that he is doing well overall. His appetite level is at 25%. His energy level is at 80%.  He reports antibiotics did not improve sinus symptoms of runny nose with clear discharge, watery eyes, and numbness on the right side of the face. Thor notes tingling and numbness on the right maxillary sinus region that is occasionally painful. Numbness and tingling radiates to the lips, up to the right side of the nose. He also notes constant frontal headaches that have worsened. Symptoms began after waking one morning 2 months ago with numbness over the whole nose that eventually resolved on the left side, but persisted on the right then spread to the right maxillary region. Maston is taking Tylenol  q4h and occasionally Aleve. He temporarily discontinued Pomalyst  last week, that did not improve sinus symptoms. He denies any fevers.  He has an appointment with ENT on 02/14/24 (Dr. Luciano), and believes some of the symptoms may be attributed to sinus issues. He is s/p functional endoscopic sinus surgery with fusion and navigation done in March 2024. He saw dermatology last week and had multiple biopsies done on the bilateral hands.    Owain took 5 pills of steroids today for treatment as prescribed, and is agreeable to proceed with treatment. He is taking blood pressure medication as described.   PAST MEDICAL HISTORY:   Past Medical History: Past Medical History:  Diagnosis Date   Anxiety    Bone metastases 09/10/2016   Breast cancer, male [facility]) 2011   Stage IV breast cancer; radiation and tamoxifen   Overview:  Left breast ca with mets bone  Overview:  METS TO BONE   Bronchitis    uses inhaler   Cough    uses inhaler   GERD (gastroesophageal reflux disease)    Headache    History of blood transfusion    Hypertension    Macular degeneration    bilateral   Memory loss    mild   Multiple myeloma ([name] 02/18/2017   Peripheral neuropathy    feet    Pneumonia    mild case 1-2 yrs ago (2023 or 2024)    Surgical History: Past Surgical History:  Procedure Laterality Date   BACK SURGERY     C2-C3 fusion   BREAST BIOPSY Left    cancer   CATARACT EXTRACTION W/PHACO Left 02/03/2019   Procedure: CATARACT EXTRACTION PHACO AND INTRAOCULAR LENS PLACEMENT (IOC);  Surgeon: Harrie Agent, MD;  Location: [facility];  Service: Ophthalmology;  Laterality: Left;  CDE: 15.89   CATARACT EXTRACTION W/PHACO Right 07/25/2021   Procedure: CATARACT EXTRACTION PHACO AND INTRAOCULAR LENS PLACEMENT (IOC);  Surgeon: Harrie Agent, MD;  Location: [facility];  Service: Ophthalmology;  Laterality: Right;  CDE   26.71   COLONOSCOPY     HERNIA REPAIR     PORTACATH PLACEMENT Left 06/10/2018   Procedure: INSERTION PORT-A-CATH;  Surgeon: Kallie Manuelita BROCKS, MD;  Location: [facility];  Service: General;  Laterality: Left;   right arm surgery     bone cancer and rod placed   SINUS ENDO WITH FUSION Bilateral 03/25/2023   Procedure: FUNCTIONAL ENDOSCOPIC SINUS SURGERY WITH FUSION AND NAVIGATION;  Surgeon: Luciano Standing, MD;  Location: [facility];  Service: ENT;  Laterality: Bilateral;    Social History: Social History   Socioeconomic History   Marital status: Married    Spouse name: Not on file   Number of children: 3   Years of education: Not on file   Highest education level: Not on file  Occupational History   Not on file  Tobacco Use   Smoking status: Never   Smokeless tobacco: Former    Types: Designer, Multimedia Use   Vaping status: Never Used  Substance and Sexual Activity   Alcohol  use: Yes    Alcohol /week: 24.0 standard drinks of alcohol     Types: 24 Cans of beer per week    Comment: No ETOH in the last 6 months as of 02/2023   Drug use: No   Sexual activity: Yes  Other Topics Concern   Not on file  Social History Narrative   Not on file   Social Drivers of Health   Financial Resource Strain: Low Risk  (11/26/2020)   Overall Financial Resource Strain  (CARDIA)    Difficulty of Paying Living Expenses: Not hard at all  Food Insecurity: Low Risk  (07/12/2023)   Received from [facility]   Hunger Vital Sign    Worried About Running Out of Food in the Last Year: Never true    Ran Out of Food in the Last Year: Never true  Transportation Needs: Not on file (07/12/2023)  Physical Activity: Inactive (11/26/2020)   Exercise Vital Sign    Days of Exercise per Week: 0 days    Minutes of Exercise per Session: 0 min  Stress: Stress Concern Present (11/26/2020)   [employer] of Occupational Health - Occupational Stress Questionnaire    Feeling of Stress : To some extent  Social Connections: Moderately Integrated (11/26/2020)   Social Connection and Isolation Panel [NHANES]    Frequency of Communication with Friends and Family: More than three times a week    Frequency of Social Gatherings with Friends and Family: Three times a week    Attends Religious Services: 1 to 4 times per year    Active Member of Clubs or Organizations: No    Attends Banker Meetings: Never    Marital Status: Married  Catering Manager Violence: Not At Risk (11/26/2020)   Humiliation, Afraid, Rape, and Kick questionnaire    Fear of Current or Ex-Partner: No    Emotionally Abused: No    Physically Abused: No    Sexually Abused: No    Family History: Family History  Problem Relation Age of Onset   Stroke Mother    Cancer Maternal Aunt        cancer NOS; died in her 79s   Lung cancer Maternal Uncle        smoker    Current Medications:  Current Outpatient Medications:    acetaminophen  (TYLENOL ) 500 MG tablet, Take 1,000 mg by mouth daily., Disp: , Rfl:    acyclovir  (ZOVIRAX ) 800 MG tablet, TAKE  1 TABLET TWICE DAILY, Disp: 180 tablet, Rfl: 3   albuterol  (VENTOLIN  HFA) 108 (90 Base) MCG/ACT inhaler, Inhale 2 puffs into the lungs every 6 (six) hours as needed for wheezing or shortness of breath., Disp: , Rfl:    ALPRAZolam  (XANAX ) 0.5 MG tablet,  TAKE 1 TABLET BY MOUTH AT BEDTIME AS NEEDED FOR anxiety / SLEEP, Disp: 30 tablet, Rfl: 5   amoxicillin -clavulanate (AUGMENTIN ) 875-125 MG tablet, Take 1 tablet by mouth 2 (two) times daily., Disp: 14 tablet, Rfl: 0   aspirin  EC 81 MG tablet, Take 81 mg by mouth daily. , Disp: , Rfl:    budesonide  (PULMICORT ) 0.5 MG/2ML nebulizer solution, Add 2 mL (1 vial) of budesonide  into 240 mL saline irrigation bottle and irrigate both nostrils morning and night (one bottle total per day)., Disp: , Rfl:    cetirizine  (ZYRTEC ) 10 MG tablet, Take 10 mg by mouth daily., Disp: , Rfl:    denosumab  (XGEVA ) 120 MG/1.7ML SOLN injection, Inject 120 mg into the skin every 30 (thirty) days. , Disp: , Rfl:    dexamethasone  (DECADRON ) 4 MG tablet, TAKE FIVE TABLETS BY MOUTH WEEKLY, Disp: 40 tablet, Rfl: 5   diphenhydrAMINE  (BENADRYL ) 12.5 MG/5ML elixir, Take 12.5 mg by mouth daily as needed., Disp: , Rfl:    folic acid  (FOLVITE ) 1 MG tablet, TAKE 1 TABLET BY MOUTH DAILY, Disp: 90 tablet, Rfl: 2   gabapentin  (NEURONTIN ) 300 MG capsule, TAKE ONE CAPSULE BY MOUTH THREE TIMES DAILY, Disp: 180 capsule, Rfl: 2   Loperamide HCl (IMODIUM PO), Take 2 mg by mouth daily as needed (diarrhea)., Disp: , Rfl:    loratadine  (CLARITIN ) 10 MG tablet, Take 1 tablet (10 mg total) by mouth daily., Disp: 90 tablet, Rfl: 6   metoprolol  succinate (TOPROL -XL) 100 MG 24 hr tablet, Take 100 mg by mouth in the morning., Disp: , Rfl:    ondansetron  (ZOFRAN ) 8 MG tablet, TAKE 1 TABLET BY MOUTH EVERY 8 HOURS AS NEEDED FOR NAUSEA AND VOMITING, Disp: 30 tablet, Rfl: 2   pantoprazole  (PROTONIX ) 40 MG tablet, TAKE 1 TABLET EVERY OTHER DAY, Disp: 45 tablet, Rfl: 3   PERIDEX  0.12 % solution, Use as directed 15 mLs in the mouth or throat 2 (two) times daily., Disp: , Rfl:    polyethylene glycol (MIRALAX  / GLYCOLAX ) packet, Take 17 g by mouth daily as needed for mild constipation., Disp: 14 each, Rfl: 0   pomalidomide  (POMALYST ) 2 MG capsule, TAKE 1 CAPSULE BY  MOUTH EVERY DAY FOR 21 DAYS ON FOLLOWED BY 7 DAYS OFF, Disp: 21 capsule, Rfl: 0   sildenafil  (VIAGRA ) 100 MG tablet, 100 mg as needed for erectile dysfunction., Disp: , Rfl:    tamoxifen  (NOLVADEX ) 20 MG tablet, TAKE 1 TABLET EVERY DAY, Disp: 90 tablet, Rfl: 3   triamcinolone (KENALOG) 0.1 % paste, Use as directed 1 Application in the mouth or throat 3 (three) times daily. as directed, Disp: , Rfl:  No current facility-administered medications for this visit.  Facility-Administered Medications Ordered in Other Visits:    ondansetron  (ZOFRAN ) 8 mg in sodium chloride  0.9 % 50 mL IVPB, 8 mg, Intravenous, Once, Lockamy, Randi L, NP-C   sodium chloride  flush (NS) 0.9 % injection 10 mL, 10 mL, Intracatheter, Once PRN, Evia [name], MD   Allergies: Allergies  Allergen Reactions   Cefepime      Suspected severe thrombocytopenia is a result of cefepime  induced antigen platelet destruction    REVIEW OF SYSTEMS:   Review of Systems  Constitutional:  Negative for chills, fatigue and fever.  HENT:   Negative for lump/mass, mouth sores, nosebleeds, sore throat and trouble swallowing.        +rhinitis +watery eyes +burning sensation on the lips  Eyes:  Negative for eye problems.  Respiratory:  Negative for cough and shortness of breath.   Cardiovascular:  Negative for chest pain, leg swelling and palpitations.  Gastrointestinal:  Positive for nausea. Negative for abdominal pain, constipation, diarrhea and vomiting.  Genitourinary:  Negative for bladder incontinence, difficulty urinating, dysuria, frequency, hematuria and nocturia.   Musculoskeletal:  Negative for arthralgias, back pain, flank pain, myalgias and neck pain.       +facial pain, 6/10 severity  Skin:  Negative for itching and rash.  Neurological:  Positive for dizziness, headaches and numbness (right sided hemifacial- sinus etiology).  Hematological:  Does not bruise/bleed easily.  Psychiatric/Behavioral:  Positive for sleep  disturbance. Negative for depression and suicidal ideas. The patient is not nervous/anxious.   All other systems reviewed and are negative.    VITALS:   Blood pressure 128/75, pulse 71, temperature 98.1 F (36.7 C), temperature source Tympanic, resp. rate 18, weight 193 lb 12.6 oz (87.9 kg), SpO2 99%.  Wt Readings from Last 3 Encounters:  02/03/24 193 lb 12.6 oz (87.9 kg)  01/06/24 198 lb 3.2 oz (89.9 kg)  12/09/23 195 lb 8.8 oz (88.7 kg)    Body mass index is 28.62 kg/m.  Performance status (ECOG): 1 - Symptomatic but completely ambulatory  PHYSICAL EXAM:   Physical Exam Vitals and nursing note reviewed. Exam conducted with a chaperone present.  Constitutional:      Appearance: Normal appearance.  HENT:     Head:     Comments: +right maxillary sided tenderness Cardiovascular:     Rate and Rhythm: Normal rate and regular rhythm.     Pulses: Normal pulses.     Heart sounds: Normal heart sounds.  Pulmonary:     Effort: Pulmonary effort is normal.     Breath sounds: Normal breath sounds.  Abdominal:     Palpations: Abdomen is soft. There is no hepatomegaly, splenomegaly or mass.     Tenderness: There is no abdominal tenderness.  Musculoskeletal:     Right lower leg: No edema.     Left lower leg: No edema.  Lymphadenopathy:     Cervical: No cervical adenopathy.     Right cervical: No superficial, deep or posterior cervical adenopathy.    Left cervical: No superficial, deep or posterior cervical adenopathy.     Upper Body:     Right upper body: No supraclavicular or axillary adenopathy.     Left upper body: No supraclavicular or axillary adenopathy.  Neurological:     General: No focal deficit present.     Mental Status: He is alert and oriented to person, place, and time.  Psychiatric:        Mood and Affect: Mood normal.        Behavior: Behavior normal.     LABS:      Latest Ref Rng & Units 01/28/2024   10:42 AM 01/06/2024    9:46 AM 12/09/2023   10:57 AM   CBC  WBC 4.0 - 10.5 K/uL 4.0  4.1  4.9   Hemoglobin 13.0 - 17.0 g/dL 89.8  89.7  89.7   Hematocrit 39.0 - 52.0 % 30.7  31.5  32.1   Platelets 150 - 400 K/uL 168  175  240       Latest  Ref Rng & Units 01/28/2024   10:42 AM 01/06/2024    9:46 AM 12/02/2023   10:23 AM  CMP  Glucose 70 - 99 mg/dL 99  898  94   BUN 8 - 23 mg/dL 14  12  12    Creatinine 0.61 - 1.24 mg/dL 8.63  8.76  8.70   Sodium 135 - 145 mmol/L 137  135  137   Potassium 3.5 - 5.1 mmol/L 4.5  4.1  4.3   Chloride 98 - 111 mmol/L 105  106  108   CO2 22 - 32 mmol/L 23  23  24    Calcium  8.9 - 10.3 mg/dL 9.3  8.2  8.5   Total Protein 6.5 - 8.1 g/dL 5.5  5.3  5.2   Total Bilirubin 0.0 - 1.2 mg/dL 0.6  0.6  0.5   Alkaline Phos 38 - 126 U/L 34  30  28   AST 15 - 41 U/L 11  13  10    ALT 0 - 44 U/L 11  11  10       No results found for: CEA1, CEA / No results found for: CEA1, CEA No results found for: PSA1 No results found for: CAN199 No results found for: RJW874  Lab Results  Component Value Date   TOTALPROTELP 4.8 (L) 01/28/2024   TOTALPROTELP 4.9 (L) 01/28/2024   ALBUMINELP 3.0 01/28/2024   A1GS 0.3 01/28/2024   A2GS 0.7 01/28/2024   BETS 0.8 01/28/2024   GAMS 0.2 (L) 01/28/2024   MSPIKE 0.1 (H) 01/28/2024   SPEI Comment 01/28/2024   Lab Results  Component Value Date   TIBC 314 03/02/2023   TIBC 308 08/25/2022   TIBC 325 03/25/2020   FERRITIN 161 03/02/2023   FERRITIN 170 08/25/2022   FERRITIN 640 (H) 03/25/2020   IRONPCTSAT 11 (L) 03/02/2023   IRONPCTSAT 24 08/25/2022   IRONPCTSAT 10 (L) 03/25/2020   Lab Results  Component Value Date   LDH 131 11/18/2021   LDH 127 09/30/2021   LDH 102 08/05/2021     STUDIES:   No results found.

## 2024-02-03 ENCOUNTER — Inpatient Hospital Stay: Payer: Medicare Other | Attending: Hematology

## 2024-02-03 ENCOUNTER — Encounter: Payer: Self-pay | Admitting: Hematology

## 2024-02-03 ENCOUNTER — Inpatient Hospital Stay (HOSPITAL_BASED_OUTPATIENT_CLINIC_OR_DEPARTMENT_OTHER): Payer: Medicare Other | Admitting: Hematology

## 2024-02-03 VITALS — BP 128/75 | HR 71 | Temp 98.1°F | Resp 18 | Wt 193.8 lb

## 2024-02-03 DIAGNOSIS — Z170 Estrogen receptor positive status [ER+]: Secondary | ICD-10-CM | POA: Diagnosis not present

## 2024-02-03 DIAGNOSIS — C50922 Malignant neoplasm of unspecified site of left male breast: Secondary | ICD-10-CM | POA: Diagnosis not present

## 2024-02-03 DIAGNOSIS — C9000 Multiple myeloma not having achieved remission: Secondary | ICD-10-CM

## 2024-02-03 DIAGNOSIS — Z7982 Long term (current) use of aspirin: Secondary | ICD-10-CM | POA: Diagnosis not present

## 2024-02-03 DIAGNOSIS — Z923 Personal history of irradiation: Secondary | ICD-10-CM | POA: Insufficient documentation

## 2024-02-03 DIAGNOSIS — K219 Gastro-esophageal reflux disease without esophagitis: Secondary | ICD-10-CM | POA: Diagnosis not present

## 2024-02-03 DIAGNOSIS — R519 Headache, unspecified: Secondary | ICD-10-CM | POA: Insufficient documentation

## 2024-02-03 DIAGNOSIS — Z5112 Encounter for antineoplastic immunotherapy: Secondary | ICD-10-CM | POA: Diagnosis not present

## 2024-02-03 DIAGNOSIS — I129 Hypertensive chronic kidney disease with stage 1 through stage 4 chronic kidney disease, or unspecified chronic kidney disease: Secondary | ICD-10-CM | POA: Diagnosis not present

## 2024-02-03 DIAGNOSIS — Z7961 Long term (current) use of immunomodulator: Secondary | ICD-10-CM | POA: Insufficient documentation

## 2024-02-03 DIAGNOSIS — R220 Localized swelling, mass and lump, head: Secondary | ICD-10-CM

## 2024-02-03 DIAGNOSIS — Z9484 Stem cells transplant status: Secondary | ICD-10-CM | POA: Diagnosis not present

## 2024-02-03 DIAGNOSIS — E611 Iron deficiency: Secondary | ICD-10-CM | POA: Insufficient documentation

## 2024-02-03 DIAGNOSIS — G629 Polyneuropathy, unspecified: Secondary | ICD-10-CM | POA: Diagnosis not present

## 2024-02-03 DIAGNOSIS — Z79810 Long term (current) use of selective estrogen receptor modulators (SERMs): Secondary | ICD-10-CM | POA: Insufficient documentation

## 2024-02-03 DIAGNOSIS — Z9221 Personal history of antineoplastic chemotherapy: Secondary | ICD-10-CM | POA: Diagnosis not present

## 2024-02-03 DIAGNOSIS — C9002 Multiple myeloma in relapse: Secondary | ICD-10-CM | POA: Insufficient documentation

## 2024-02-03 DIAGNOSIS — D801 Nonfamilial hypogammaglobulinemia: Secondary | ICD-10-CM | POA: Insufficient documentation

## 2024-02-03 LAB — PROTEIN ELECTROPHORESIS, SERUM
A/G Ratio: 1.6 (ref 0.7–1.7)
Albumin ELP: 3 g/dL (ref 2.9–4.4)
Alpha-1-Globulin: 0.3 g/dL (ref 0.0–0.4)
Alpha-2-Globulin: 0.7 g/dL (ref 0.4–1.0)
Beta Globulin: 0.8 g/dL (ref 0.7–1.3)
Gamma Globulin: 0.2 g/dL — ABNORMAL LOW (ref 0.4–1.8)
Globulin, Total: 1.9 g/dL — ABNORMAL LOW (ref 2.2–3.9)
M-Spike, %: 0.1 g/dL — ABNORMAL HIGH
Total Protein ELP: 4.9 g/dL — ABNORMAL LOW (ref 6.0–8.5)

## 2024-02-03 MED ORDER — DARATUMUMAB-HYALURONIDASE-FIHJ 1800-30000 MG-UT/15ML ~~LOC~~ SOLN
1800.0000 mg | Freq: Once | SUBCUTANEOUS | Status: AC
  Administered 2024-02-03: 1800 mg via SUBCUTANEOUS
  Filled 2024-02-03: qty 15

## 2024-02-03 NOTE — Patient Instructions (Signed)
 [facility] [place] - A DEPT OF [facility]  Discharge Instructions: Thank you for choosing [facility] to provide your oncology and hematology care.  If you have a lab appointment with the Cancer Center - please note that after April 8th, 2024, all labs will be drawn in the cancer center.  You do not have to check in or register with the main entrance as you have in the past but will complete your check-in in the cancer center.  Wear comfortable clothing and clothing appropriate for easy access to any Portacath or PICC line.   We strive to give you quality time with your provider. You may need to reschedule your appointment if you arrive late (15 or more minutes).  Arriving late affects you and other patients whose appointments are after yours.  Also, if you miss three or more appointments without notifying the office, you may be dismissed from the clinic at the provider's discretion.      For prescription refill requests, have your pharmacy contact our office and allow 72 hours for refills to be completed.    Today you received the following chemotherapy and/or immunotherapy agents Daratumumab.  Daratumumab; Hyaluronidase Injection What is this medication? DARATUMUMAB; HYALURONIDASE (dar a toom ue mab; hye al ur ON i dase) treats multiple myeloma, a type of bone marrow cancer. Daratumumab works by blocking a protein that causes cancer cells to grow and multiply. This helps to slow or stop the spread of cancer cells. Hyaluronidase works by increasing the absorption of other medications in the body to help them work better. This medication may also be used treat amyloidosis, a condition that causes the buildup of a protein (amyloid) in your body. It works by reducing the buildup of this protein, which decreases symptoms. It is a combination medication that contains a monoclonal antibody. This medicine may be used for other purposes; ask your health care provider  or pharmacist if you have questions. COMMON BRAND NAME(S): DARZALEX FASPRO What should I tell my care team before I take this medication? They need to know if you have any of these conditions: Heart disease Infection, such as chickenpox, cold sores, herpes, hepatitis B Lung or breathing disease An unusual or allergic reaction to daratumumab, hyaluronidase, other medications, foods, dyes, or preservatives Pregnant or trying to get pregnant Breast-feeding How should I use this medication? This medication is injected under the skin. It is given by your care team in a hospital or clinic setting. Talk to your care team about the use of this medication in children. Special care may be needed. Overdosage: If you think you have taken too much of this medicine contact a poison control center or emergency room at once. NOTE: This medicine is only for you. Do not share this medicine with others. What if I miss a dose? Keep appointments for follow-up doses. It is important not to miss your dose. Call your care team if you are unable to keep an appointment. What may interact with this medication? Interactions have not been studied. This list may not describe all possible interactions. Give your health care provider a list of all the medicines, herbs, non-prescription drugs, or dietary supplements you use. Also tell them if you smoke, drink alcohol, or use illegal drugs. Some items may interact with your medicine. What should I watch for while using this medication? Your condition will be monitored carefully while you are receiving this medication. This medication can cause serious allergic  reactions. To reduce your risk, your care team may give you other medication to take before receiving this one. Be sure to follow the directions from your care team. This medication can affect the results of blood tests to match your blood type. These changes can last for up to 6 months after the final dose. Your care  team will do blood tests to match your blood type before you start treatment. Tell all of your care team that you are being treated with this medication before receiving a blood transfusion. This medication can affect the results of some tests used to determine treatment response; extra tests may be needed to evaluate response. Talk to your care team if you wish to become pregnant or think you are pregnant. This medication can cause serious birth defects if taken during pregnancy and for 3 months after the last dose. A reliable form of contraception is recommended while taking this medication and for 3 months after the last dose. Talk to your care team about effective forms of contraception. Do not breast-feed while taking this medication. What side effects may I notice from receiving this medication? Side effects that you should report to your care team as soon as possible: Allergic reactions--skin rash, itching, hives, swelling of the face, lips, tongue, or throat Heart rhythm changes--fast or irregular heartbeat, dizziness, feeling faint or lightheaded, chest pain, trouble breathing Infection--fever, chills, cough, sore throat, wounds that don't heal, pain or trouble when passing urine, general feeling of discomfort or being unwell Infusion reactions--chest pain, shortness of breath or trouble breathing, feeling faint or lightheaded Sudden eye pain or change in vision such as blurry vision, seeing halos around lights, vision loss Unusual bruising or bleeding Side effects that usually do not require medical attention (report to your care team if they continue or are bothersome): Constipation Diarrhea Fatigue Nausea Pain, tingling, or numbness in the hands or feet Swelling of the ankles, hands, or feet This list may not describe all possible side effects. Call your doctor for medical advice about side effects. You may report side effects to FDA at 1-800-FDA-1088. Where should I keep my  medication? This medication is given in a hospital or clinic. It will not be stored at home. NOTE: This sheet is a summary. It may not cover all possible information. If you have questions about this medicine, talk to your doctor, pharmacist, or health care provider.  2024 Elsevier/Gold Standard (2022-04-21 00:00:00)        To help prevent nausea and vomiting after your treatment, we encourage you to take your nausea medication as directed.  BELOW ARE SYMPTOMS THAT SHOULD BE REPORTED IMMEDIATELY: *FEVER GREATER THAN 100.4 F (38 C) OR HIGHER *CHILLS OR SWEATING *NAUSEA AND VOMITING THAT IS NOT CONTROLLED WITH YOUR NAUSEA MEDICATION *UNUSUAL SHORTNESS OF BREATH *UNUSUAL BRUISING OR BLEEDING *URINARY PROBLEMS (pain or burning when urinating, or frequent urination) *BOWEL PROBLEMS (unusual diarrhea, constipation, pain near the anus) TENDERNESS IN MOUTH AND THROAT WITH OR WITHOUT PRESENCE OF ULCERS (sore throat, sores in mouth, or a toothache) UNUSUAL RASH, SWELLING OR PAIN  UNUSUAL VAGINAL DISCHARGE OR ITCHING   Items with * indicate a potential emergency and should be followed up as soon as possible or go to the Emergency Department if any problems should occur.  Please show the CHEMOTHERAPY ALERT CARD or IMMUNOTHERAPY ALERT CARD at check-in to the Emergency Department and triage nurse.  Should you have questions after your visit or need to cancel or reschedule your appointment, please contact  [facility] [place] - A DEPT OF Eligha Brideg[facility] [phone]  and follow the prompts.  Office hours are 8:00 a.m. to 4:30 p.m. Monday - Friday. Please note that voicemails left after 4:00 p.m. may not be returned until the following business day.  We are closed weekends and major holidays. You have access to a nurse at all times for urgent questions. Please call the main number to the clinic [phone] and follow the prompts.  For any non-urgent questions, you may also contact  your provider using MyChart. We now offer e-Visits for anyone 50 and older to request care online for non-urgent symptoms. For details visit [email or web address].   Also download the MyChart app! Go to the app store, search "MyChart", open the app, select [place], and log in with your MyChart username and password.

## 2024-02-03 NOTE — Patient Instructions (Signed)

## 2024-02-03 NOTE — Progress Notes (Signed)
 Patient presents today for chemotherapy infusion. Patient is in satisfactory condition with no new complaints voiced.  Patient is still complaining on ongoing sinus drainage/pain.  He is scheduled to see ENT on 02/14/24.  We will hold Xgeva  tomorrow due to this per Dr. Rogers.  Vital signs are stable.  Labs from 01/28/24 reviewed by Dr. Rogers during the office visit and all labs are within treatment parameters.  Per patient, all pre-medications were taken at 0830 at home prior to visit.  We will proceed with treatment per MD orders.   Patient tolerated Daratumumab  injection with no complaints voiced.  Site clean and dry with no bruising or swelling noted.  No complaints of pain.  Discharged with vital signs stable and no signs or symptoms of distress noted.

## 2024-02-03 NOTE — Progress Notes (Signed)
Patient is taking Pomalyst as prescribed.  He has not missed any doses and reports no side effects at this time.    Patient has been examined by Dr. Katragadda. Vital signs and labs have been reviewed by MD - ANC, Creatinine, LFTs, hemoglobin, and platelets are within treatment parameters per M.D. - pt may proceed with treatment.  Primary RN and pharmacy notified.  

## 2024-02-04 ENCOUNTER — Other Ambulatory Visit: Payer: Self-pay

## 2024-02-04 ENCOUNTER — Inpatient Hospital Stay: Payer: Medicare Other

## 2024-02-10 ENCOUNTER — Ambulatory Visit (HOSPITAL_COMMUNITY)
Admission: RE | Admit: 2024-02-10 | Discharge: 2024-02-10 | Disposition: A | Discharge status: Home / Self Care | Payer: Medicare Other | Source: Ambulatory Visit | Attending: Hematology | Admitting: Hematology

## 2024-02-10 DIAGNOSIS — J329 Chronic sinusitis, unspecified: Secondary | ICD-10-CM | POA: Diagnosis not present

## 2024-02-10 DIAGNOSIS — R220 Localized swelling, mass and lump, head: Secondary | ICD-10-CM | POA: Insufficient documentation

## 2024-02-10 DIAGNOSIS — M799 Soft tissue disorder, unspecified: Secondary | ICD-10-CM | POA: Diagnosis not present

## 2024-02-10 MED ORDER — HEPARIN SOD (PORK) LOCK FLUSH 100 UNIT/ML IV SOLN
INTRAVENOUS | Status: AC
  Filled 2024-02-10: qty 5

## 2024-02-10 MED ORDER — IOHEXOL 350 MG/ML SOLN
75.0000 mL | Freq: Once | INTRAVENOUS | Status: AC | PRN
  Administered 2024-02-10: 75 mL via INTRAVENOUS

## 2024-02-14 ENCOUNTER — Other Ambulatory Visit: Payer: Self-pay | Admitting: Otolaryngology

## 2024-02-14 ENCOUNTER — Encounter (HOSPITAL_COMMUNITY): Payer: Self-pay | Admitting: Otolaryngology

## 2024-02-14 ENCOUNTER — Other Ambulatory Visit: Payer: Self-pay | Admitting: *Deleted

## 2024-02-14 DIAGNOSIS — G509 Disorder of trigeminal nerve, unspecified: Secondary | ICD-10-CM | POA: Diagnosis not present

## 2024-02-14 DIAGNOSIS — J320 Chronic maxillary sinusitis: Secondary | ICD-10-CM | POA: Diagnosis not present

## 2024-02-14 DIAGNOSIS — Z9889 Other specified postprocedural states: Secondary | ICD-10-CM | POA: Diagnosis not present

## 2024-02-14 DIAGNOSIS — G529 Cranial nerve disorder, unspecified: Secondary | ICD-10-CM | POA: Diagnosis not present

## 2024-02-14 DIAGNOSIS — H05221 Edema of right orbit: Secondary | ICD-10-CM | POA: Diagnosis not present

## 2024-02-14 DIAGNOSIS — C9000 Multiple myeloma not having achieved remission: Secondary | ICD-10-CM

## 2024-02-14 DIAGNOSIS — J329 Chronic sinusitis, unspecified: Secondary | ICD-10-CM | POA: Diagnosis not present

## 2024-02-14 DIAGNOSIS — R200 Anesthesia of skin: Secondary | ICD-10-CM | POA: Diagnosis not present

## 2024-02-14 MED ORDER — GABAPENTIN 300 MG PO CAPS: 300.0000 mg | ORAL_CAPSULE | Freq: Three times a day (TID) | ORAL | 2 refills | Status: DC

## 2024-02-15 ENCOUNTER — Encounter (HOSPITAL_COMMUNITY): Payer: Self-pay | Admitting: Otolaryngology

## 2024-02-15 ENCOUNTER — Other Ambulatory Visit: Payer: Self-pay

## 2024-02-15 NOTE — Progress Notes (Addendum)
SDW CALL  Patient was given pre-op instructions over the phone. The opportunity was given for the patient to ask questions. No further questions asked. Patient verbalized understanding of instructions given.   PCP - Joanna Hews at [facility] in [place] Cardiologist - denies Oncologist - Dr. Ellin Saba  PPM/ICD - denies Device Orders - n/a Rep Notified - n/a  Chest x-ray - denies EKG - 03/05/23 - showed A.Fib which was new for the patient - patient stated they continued with surgery and he discussed this with his oncologist and PCP - no orders or new EKG obtained after that.  Antionette Poles was made aware - at this time, no new orders to repeat EKG on DOS  Stress Test - denies ECHO - denies Cardiac Cath - denies  Sleep Study - denies CPAP - n/a  No DM  Last dose of GLP1 agonist-  n/a GLP1 instructions:  n/a  Blood Thinner Instructions: n/a Aspirin Instructions:  patient states that he last took Aspirin 3-4 days ago per Dr. Ernestene Kiel  ERAS Protcol - clears until 1315 PRE-SURGERY Ensure or G2- n/a  COVID TEST- n/a   Anesthesia review: yes  Patient states that he was started on Doxycycline by Dr. Ernestene Kiel.   Patient denies shortness of breath, fever, cough and chest pain over the phone call   All instructions explained to the patient, with a verbal understanding of the material. Patient agrees to go over the instructions while at home for a better understanding.

## 2024-02-16 ENCOUNTER — Inpatient Hospital Stay (HOSPITAL_COMMUNITY): Payer: Medicare Other

## 2024-02-16 ENCOUNTER — Other Ambulatory Visit: Payer: Self-pay

## 2024-02-16 ENCOUNTER — Encounter (HOSPITAL_COMMUNITY)
Admission: RE | Disposition: A | Discharge status: Home Health | Payer: Self-pay | Source: Home / Self Care | Attending: Otolaryngology

## 2024-02-16 ENCOUNTER — Ambulatory Visit (HOSPITAL_COMMUNITY): Payer: Self-pay | Admitting: Physician Assistant

## 2024-02-16 ENCOUNTER — Encounter (HOSPITAL_COMMUNITY): Payer: Self-pay | Admitting: Otolaryngology

## 2024-02-16 ENCOUNTER — Inpatient Hospital Stay (HOSPITAL_COMMUNITY)
Admission: RE | Admit: 2024-02-16 | Discharge: 2024-02-21 | DRG: 144 | Disposition: A | Discharge status: Home Health | Payer: Medicare Other | Attending: Otolaryngology | Admitting: Otolaryngology

## 2024-02-16 DIAGNOSIS — G589 Mononeuropathy, unspecified: Secondary | ICD-10-CM

## 2024-02-16 DIAGNOSIS — Z801 Family history of malignant neoplasm of trachea, bronchus and lung: Secondary | ICD-10-CM

## 2024-02-16 DIAGNOSIS — N179 Acute kidney failure, unspecified: Secondary | ICD-10-CM | POA: Diagnosis not present

## 2024-02-16 DIAGNOSIS — G4700 Insomnia, unspecified: Secondary | ICD-10-CM

## 2024-02-16 DIAGNOSIS — M879 Osteonecrosis, unspecified: Secondary | ICD-10-CM | POA: Diagnosis present

## 2024-02-16 DIAGNOSIS — G528 Disorders of other specified cranial nerves: Secondary | ICD-10-CM | POA: Diagnosis present

## 2024-02-16 DIAGNOSIS — B9689 Other specified bacterial agents as the cause of diseases classified elsewhere: Secondary | ICD-10-CM | POA: Diagnosis not present

## 2024-02-16 DIAGNOSIS — R609 Edema, unspecified: Secondary | ICD-10-CM | POA: Diagnosis not present

## 2024-02-16 DIAGNOSIS — M272 Inflammatory conditions of jaws: Principal | ICD-10-CM | POA: Diagnosis present

## 2024-02-16 DIAGNOSIS — Z881 Allergy status to other antibiotic agents status: Secondary | ICD-10-CM

## 2024-02-16 DIAGNOSIS — G529 Cranial nerve disorder, unspecified: Secondary | ICD-10-CM | POA: Diagnosis not present

## 2024-02-16 DIAGNOSIS — M6008 Infective myositis, other site: Secondary | ICD-10-CM | POA: Diagnosis present

## 2024-02-16 DIAGNOSIS — J329 Chronic sinusitis, unspecified: Secondary | ICD-10-CM | POA: Diagnosis not present

## 2024-02-16 DIAGNOSIS — J189 Pneumonia, unspecified organism: Secondary | ICD-10-CM | POA: Diagnosis not present

## 2024-02-16 DIAGNOSIS — Z853 Personal history of malignant neoplasm of breast: Secondary | ICD-10-CM | POA: Diagnosis not present

## 2024-02-16 DIAGNOSIS — K219 Gastro-esophageal reflux disease without esophagitis: Secondary | ICD-10-CM | POA: Diagnosis present

## 2024-02-16 DIAGNOSIS — H3530 Unspecified macular degeneration: Secondary | ICD-10-CM | POA: Diagnosis present

## 2024-02-16 DIAGNOSIS — F419 Anxiety disorder, unspecified: Secondary | ICD-10-CM | POA: Diagnosis present

## 2024-02-16 DIAGNOSIS — I10 Essential (primary) hypertension: Secondary | ICD-10-CM

## 2024-02-16 DIAGNOSIS — Z95828 Presence of other vascular implants and grafts: Secondary | ICD-10-CM | POA: Diagnosis not present

## 2024-02-16 DIAGNOSIS — R200 Anesthesia of skin: Secondary | ICD-10-CM | POA: Insufficient documentation

## 2024-02-16 DIAGNOSIS — Z823 Family history of stroke: Secondary | ICD-10-CM

## 2024-02-16 DIAGNOSIS — M869 Osteomyelitis, unspecified: Secondary | ICD-10-CM

## 2024-02-16 DIAGNOSIS — Z79899 Other long term (current) drug therapy: Secondary | ICD-10-CM

## 2024-02-16 DIAGNOSIS — L03211 Cellulitis of face: Principal | ICD-10-CM | POA: Diagnosis present

## 2024-02-16 DIAGNOSIS — C9000 Multiple myeloma not having achieved remission: Secondary | ICD-10-CM | POA: Diagnosis present

## 2024-02-16 DIAGNOSIS — J320 Chronic maxillary sinusitis: Principal | ICD-10-CM

## 2024-02-16 DIAGNOSIS — Z923 Personal history of irradiation: Secondary | ICD-10-CM | POA: Diagnosis not present

## 2024-02-16 DIAGNOSIS — M8788 Other osteonecrosis, other site: Secondary | ICD-10-CM | POA: Diagnosis not present

## 2024-02-16 DIAGNOSIS — Z981 Arthrodesis status: Secondary | ICD-10-CM | POA: Diagnosis not present

## 2024-02-16 DIAGNOSIS — B954 Other streptococcus as the cause of diseases classified elsewhere: Secondary | ICD-10-CM | POA: Diagnosis present

## 2024-02-16 DIAGNOSIS — H05221 Edema of right orbit: Secondary | ICD-10-CM | POA: Diagnosis present

## 2024-02-16 DIAGNOSIS — G509 Disorder of trigeminal nerve, unspecified: Secondary | ICD-10-CM | POA: Diagnosis not present

## 2024-02-16 DIAGNOSIS — M8640 Chronic osteomyelitis with draining sinus, unspecified site: Secondary | ICD-10-CM | POA: Diagnosis not present

## 2024-02-16 DIAGNOSIS — K047 Periapical abscess without sinus: Secondary | ICD-10-CM | POA: Diagnosis present

## 2024-02-16 HISTORY — PX: MAXILLARY ANTROSTOMY: SHX2003

## 2024-02-16 HISTORY — PX: NASAL SINUS SURGERY: SHX719

## 2024-02-16 HISTORY — PX: NASAL TURBINATE REDUCTION: SHX2072

## 2024-02-16 SURGERY — SINUS SURGERY, ENDOSCOPIC
Anesthesia: General | Site: Mouth | Laterality: Right

## 2024-02-16 MED ORDER — OXYCODONE HCL 5 MG PO TABS: 5.0000 mg | ORAL_TABLET | ORAL | Status: DC | PRN

## 2024-02-16 MED ORDER — OXYCODONE HCL 5 MG PO TABS: 5.0000 mg | ORAL_TABLET | Freq: Once | ORAL | Status: DC | PRN

## 2024-02-16 MED ORDER — ACETAMINOPHEN 325 MG PO TABS
650.0000 mg | ORAL_TABLET | Freq: Four times a day (QID) | ORAL | Status: DC | PRN
  Administered 2024-02-16 – 2024-02-20 (×11): 650 mg via ORAL
  Filled 2024-02-16 (×11): qty 2

## 2024-02-16 MED ORDER — ACETAMINOPHEN 500 MG PO TABS
ORAL_TABLET | ORAL | Status: AC
  Administered 2024-02-16: 500 mg via ORAL
  Filled 2024-02-16: qty 1

## 2024-02-16 MED ORDER — CHLORHEXIDINE GLUCONATE 0.12 % MT SOLN
15.0000 mL | Freq: Two times a day (BID) | OROMUCOSAL | Status: DC
  Administered 2024-02-16: 15 mL via OROMUCOSAL
  Filled 2024-02-16 (×2): qty 15

## 2024-02-16 MED ORDER — ACYCLOVIR 400 MG PO TABS
800.0000 mg | ORAL_TABLET | Freq: Two times a day (BID) | ORAL | Status: DC
  Administered 2024-02-16 – 2024-02-21 (×10): 800 mg via ORAL
  Filled 2024-02-16 (×11): qty 2

## 2024-02-16 MED ORDER — PHENYLEPHRINE 80 MCG/ML (10ML) SYRINGE FOR IV PUSH (FOR BLOOD PRESSURE SUPPORT)
PREFILLED_SYRINGE | INTRAVENOUS | Status: AC
  Filled 2024-02-16: qty 10

## 2024-02-16 MED ORDER — POLYETHYLENE GLYCOL 3350 17 G PO PACK: 17.0000 g | PACK | Freq: Every day | ORAL | Status: DC | PRN

## 2024-02-16 MED ORDER — ONDANSETRON HCL 4 MG/2ML IJ SOLN
INTRAMUSCULAR | Status: AC
  Filled 2024-02-16: qty 2

## 2024-02-16 MED ORDER — ACETAMINOPHEN 500 MG PO TABS: 1000.0000 mg | ORAL_TABLET | Freq: Once | ORAL | Status: DC | PRN

## 2024-02-16 MED ORDER — VANCOMYCIN HCL 1250 MG/250ML IV SOLN
1250.0000 mg | INTRAVENOUS | Status: DC
  Filled 2024-02-16: qty 250

## 2024-02-16 MED ORDER — EPINEPHRINE HCL (NASAL) 0.1 % NA SOLN
NASAL | Status: DC | PRN
  Administered 2024-02-16: 10 mL via NASAL

## 2024-02-16 MED ORDER — FENTANYL CITRATE (PF) 250 MCG/5ML IJ SOLN
INTRAMUSCULAR | Status: AC
Start: 2024-02-16 — End: ?
  Filled 2024-02-16: qty 5

## 2024-02-16 MED ORDER — ACETAMINOPHEN 160 MG/5ML PO SOLN: 1000.0000 mg | Freq: Once | ORAL | Status: DC | PRN

## 2024-02-16 MED ORDER — ACETAMINOPHEN 500 MG PO TABS: 500.0000 mg | ORAL_TABLET | Freq: Once | ORAL | Status: AC

## 2024-02-16 MED ORDER — FENTANYL CITRATE (PF) 100 MCG/2ML IJ SOLN
INTRAMUSCULAR | Status: AC
  Filled 2024-02-16: qty 2

## 2024-02-16 MED ORDER — SODIUM CHLORIDE 0.9 % IV SOLN
3.0000 g | Freq: Four times a day (QID) | INTRAVENOUS | Status: DC
  Administered 2024-02-16 – 2024-02-18 (×7): 3 g via INTRAVENOUS
  Filled 2024-02-16 (×7): qty 8

## 2024-02-16 MED ORDER — SUGAMMADEX SODIUM 200 MG/2ML IV SOLN
INTRAVENOUS | Status: DC | PRN
  Administered 2024-02-16: 200 mg via INTRAVENOUS

## 2024-02-16 MED ORDER — FLUORESCEIN SODIUM 1 MG OP STRP
ORAL_STRIP | OPHTHALMIC | Status: DC | PRN
  Administered 2024-02-16: 1

## 2024-02-16 MED ORDER — FENTANYL CITRATE (PF) 250 MCG/5ML IJ SOLN
INTRAMUSCULAR | Status: DC | PRN
  Administered 2024-02-16: 100 ug via INTRAVENOUS
  Administered 2024-02-16: 50 ug via INTRAVENOUS

## 2024-02-16 MED ORDER — GADOBUTROL 1 MMOL/ML IV SOLN
9.0000 mL | Freq: Once | INTRAVENOUS | Status: AC | PRN
  Administered 2024-02-16: 9 mL via INTRAVENOUS

## 2024-02-16 MED ORDER — PROPOFOL 10 MG/ML IV BOLUS
INTRAVENOUS | Status: DC | PRN
  Administered 2024-02-16: 130 mg via INTRAVENOUS

## 2024-02-16 MED ORDER — VANCOMYCIN HCL 2000 MG/400ML IV SOLN
2000.0000 mg | Freq: Once | INTRAVENOUS | Status: AC
  Administered 2024-02-16: 2000 mg via INTRAVENOUS
  Filled 2024-02-16 (×2): qty 400

## 2024-02-16 MED ORDER — FENTANYL CITRATE (PF) 100 MCG/2ML IJ SOLN
25.0000 ug | INTRAMUSCULAR | Status: DC | PRN
  Administered 2024-02-16 (×4): 50 ug via INTRAVENOUS

## 2024-02-16 MED ORDER — FENTANYL CITRATE (PF) 100 MCG/2ML IJ SOLN
INTRAMUSCULAR | Status: AC
Start: 2024-02-16 — End: 2024-02-17
  Filled 2024-02-16: qty 2

## 2024-02-16 MED ORDER — ROCURONIUM BROMIDE 10 MG/ML (PF) SYRINGE
PREFILLED_SYRINGE | INTRAVENOUS | Status: DC | PRN
  Administered 2024-02-16: 60 mg via INTRAVENOUS

## 2024-02-16 MED ORDER — SODIUM CHLORIDE 0.9% FLUSH
10.0000 mL | INTRAVENOUS | Status: DC | PRN
  Administered 2024-02-21: 10 mL

## 2024-02-16 MED ORDER — ALBUTEROL SULFATE (2.5 MG/3ML) 0.083% IN NEBU: 3.0000 mL | INHALATION_SOLUTION | Freq: Four times a day (QID) | RESPIRATORY_TRACT | Status: DC | PRN

## 2024-02-16 MED ORDER — CHLORHEXIDINE GLUCONATE 0.12 % MT SOLN: 15.0000 mL | Freq: Once | OROMUCOSAL | Status: AC

## 2024-02-16 MED ORDER — LIDOCAINE-EPINEPHRINE 1 %-1:100000 IJ SOLN
INTRAMUSCULAR | Status: AC
  Filled 2024-02-16: qty 1

## 2024-02-16 MED ORDER — METOPROLOL SUCCINATE ER 50 MG PO TB24
100.0000 mg | ORAL_TABLET | Freq: Every day | ORAL | Status: DC
  Administered 2024-02-18 – 2024-02-21 (×4): 100 mg via ORAL
  Filled 2024-02-16 (×5): qty 2

## 2024-02-16 MED ORDER — DEXAMETHASONE SODIUM PHOSPHATE 10 MG/ML IJ SOLN
INTRAMUSCULAR | Status: DC | PRN
  Administered 2024-02-16: 10 mg via INTRAVENOUS

## 2024-02-16 MED ORDER — FLUORESCEIN SODIUM 1 MG OP STRP
ORAL_STRIP | OPHTHALMIC | Status: AC
  Filled 2024-02-16: qty 2

## 2024-02-16 MED ORDER — ROCURONIUM BROMIDE 10 MG/ML (PF) SYRINGE
PREFILLED_SYRINGE | INTRAVENOUS | Status: AC
  Filled 2024-02-16: qty 10

## 2024-02-16 MED ORDER — ONDANSETRON HCL 4 MG/2ML IJ SOLN: 4.0000 mg | INTRAMUSCULAR | Status: DC | PRN

## 2024-02-16 MED ORDER — LACTATED RINGERS IV SOLN: INTRAVENOUS | Status: DC

## 2024-02-16 MED ORDER — ACETAMINOPHEN 10 MG/ML IV SOLN: 1000.0000 mg | Freq: Once | INTRAVENOUS | Status: DC | PRN

## 2024-02-16 MED ORDER — ONDANSETRON HCL 4 MG PO TABS: 4.0000 mg | ORAL_TABLET | ORAL | Status: DC | PRN

## 2024-02-16 MED ORDER — POMALIDOMIDE 2 MG PO CAPS: ORAL_CAPSULE | ORAL | 0 refills | Status: DC

## 2024-02-16 MED ORDER — LIDOCAINE 2% (20 MG/ML) 5 ML SYRINGE
INTRAMUSCULAR | Status: DC | PRN
Start: 2024-02-16 — End: 2024-02-16
  Administered 2024-02-16: 60 mg via INTRAVENOUS

## 2024-02-16 MED ORDER — MUPIROCIN 2 % EX OINT
TOPICAL_OINTMENT | CUTANEOUS | Status: AC
  Filled 2024-02-16: qty 22

## 2024-02-16 MED ORDER — SODIUM CHLORIDE 0.9 % IV SOLN
100.0000 mg | INTRAVENOUS | Status: DC
  Administered 2024-02-16 – 2024-02-21 (×6): 100 mg via INTRAVENOUS
  Filled 2024-02-16 (×6): qty 5

## 2024-02-16 MED ORDER — OXYCODONE HCL 5 MG/5ML PO SOLN: 5.0000 mg | Freq: Once | ORAL | Status: DC | PRN

## 2024-02-16 MED ORDER — DEXAMETHASONE SODIUM PHOSPHATE 10 MG/ML IJ SOLN
INTRAMUSCULAR | Status: AC
  Filled 2024-02-16: qty 1

## 2024-02-16 MED ORDER — CHLORHEXIDINE GLUCONATE CLOTH 2 % EX PADS
6.0000 | MEDICATED_PAD | Freq: Every day | CUTANEOUS | Status: DC
  Administered 2024-02-17 – 2024-02-21 (×5): 6 via TOPICAL

## 2024-02-16 MED ORDER — SODIUM CHLORIDE 0.9% FLUSH
10.0000 mL | Freq: Two times a day (BID) | INTRAVENOUS | Status: DC
  Administered 2024-02-16 – 2024-02-21 (×10): 10 mL

## 2024-02-16 MED ORDER — CHLORHEXIDINE GLUCONATE 0.12 % MT SOLN
OROMUCOSAL | Status: AC
Start: 2024-02-16 — End: 2024-02-16
  Administered 2024-02-16: 15 mL via OROMUCOSAL
  Filled 2024-02-16: qty 15

## 2024-02-16 MED ORDER — TAMOXIFEN CITRATE 10 MG PO TABS
20.0000 mg | ORAL_TABLET | Freq: Every day | ORAL | Status: DC
  Administered 2024-02-17 – 2024-02-21 (×5): 20 mg via ORAL
  Filled 2024-02-16 (×5): qty 2

## 2024-02-16 MED ORDER — GABAPENTIN 300 MG PO CAPS
300.0000 mg | ORAL_CAPSULE | Freq: Three times a day (TID) | ORAL | Status: DC
  Administered 2024-02-16 – 2024-02-21 (×14): 300 mg via ORAL
  Filled 2024-02-16 (×14): qty 1

## 2024-02-16 MED ORDER — ORAL CARE MOUTH RINSE: 15.0000 mL | Freq: Once | OROMUCOSAL | Status: AC

## 2024-02-16 MED ORDER — LIDOCAINE 2% (20 MG/ML) 5 ML SYRINGE
INTRAMUSCULAR | Status: AC
  Filled 2024-02-16: qty 5

## 2024-02-16 MED ORDER — PHENYLEPHRINE 80 MCG/ML (10ML) SYRINGE FOR IV PUSH (FOR BLOOD PRESSURE SUPPORT)
PREFILLED_SYRINGE | INTRAVENOUS | Status: DC | PRN
Start: 2024-02-16 — End: 2024-02-16
  Administered 2024-02-16: 80 ug via INTRAVENOUS

## 2024-02-16 MED ORDER — EPINEPHRINE HCL (NASAL) 0.1 % NA SOLN
NASAL | Status: AC
  Filled 2024-02-16: qty 60

## 2024-02-16 MED ORDER — SODIUM CHLORIDE 0.9 % IR SOLN
Status: DC | PRN
  Administered 2024-02-16: 2000 mL

## 2024-02-16 MED ORDER — ONDANSETRON HCL 4 MG/2ML IJ SOLN
INTRAMUSCULAR | Status: DC | PRN
  Administered 2024-02-16: 4 mg via INTRAVENOUS

## 2024-02-16 MED ORDER — SUGAMMADEX SODIUM 200 MG/2ML IV SOLN
INTRAVENOUS | Status: AC
  Filled 2024-02-16: qty 2

## 2024-02-16 MED ORDER — SALINE SPRAY 0.65 % NA SOLN
2.0000 | Freq: Three times a day (TID) | NASAL | Status: DC
  Administered 2024-02-16 – 2024-02-21 (×14): 2 via NASAL
  Filled 2024-02-16: qty 44

## 2024-02-16 SURGICAL SUPPLY — 36 items
ANTIFOG SOL W/FOAM PAD STRL (MISCELLANEOUS) ×2 IMPLANT
BAG COUNTER SPONGE SURGICOUNT (BAG) ×2 IMPLANT
BLADE NAVIG QUADCUT 4.3X13 M4 (BLADE) ×2 IMPLANT
BUR STRYKR EGG 5.0 (BURR) IMPLANT
CANISTER SUCT 3000ML PPV (MISCELLANEOUS) ×4 IMPLANT
COAGULATOR SUCT 8FR VV (MISCELLANEOUS) ×2 IMPLANT
ELECT REM PT RETURN 9FT ADLT (ELECTROSURGICAL) ×2 IMPLANT
ELECTRODE REM PT RTRN 9FT ADLT (ELECTROSURGICAL) ×2 IMPLANT
FILTER ARTHROSCOPY CONVERTOR (FILTER) ×4 IMPLANT
GAUZE XEROFORM 5X9 LF (GAUZE/BANDAGES/DRESSINGS) IMPLANT
GLOVE BIO SURGEON STRL SZ7.5 (GLOVE) ×2 IMPLANT
GLOVE BIOGEL PI IND STRL 8 (GLOVE) ×2 IMPLANT
GOWN STRL REUS W/ TWL LRG LVL3 (GOWN DISPOSABLE) ×2 IMPLANT
GOWN STRL REUS W/ TWL XL LVL3 (GOWN DISPOSABLE) ×2 IMPLANT
IV NS 1000ML BAXH (IV SOLUTION) ×4 IMPLANT
KIT BASIN OR (CUSTOM PROCEDURE TRAY) ×2 IMPLANT
KIT TURNOVER KIT B (KITS) ×2 IMPLANT
NDL PRECISIONGLIDE 27X1.5 (NEEDLE) IMPLANT
NEEDLE PRECISIONGLIDE 27X1.5 (NEEDLE) ×2 IMPLANT
NS IRRIG 1000ML POUR BTL (IV SOLUTION) ×2 IMPLANT
PAD ARMBOARD 7.5X6 YLW CONV (MISCELLANEOUS) ×4 IMPLANT
PATTIES SURGICAL .5 X3 (DISPOSABLE) ×2 IMPLANT
SHEATH ENDOSCRUB 0 DEG (SHEATH) ×2 IMPLANT
SHEATH ENDOSCRUB 30 DEG (SHEATH) IMPLANT
SOLUTION ANTFG W/FOAM PAD STRL (MISCELLANEOUS) ×2 IMPLANT
SPECIMEN JAR SMALL (MISCELLANEOUS) IMPLANT
SPLINT NASAL POSISEP X2 .8X2.3 (GAUZE/BANDAGES/DRESSINGS) IMPLANT
SUT SILK 3 0 RB1 (SUTURE) IMPLANT
SWAB COLLECTION DEVICE MRSA (MISCELLANEOUS) IMPLANT
SWAB CULTURE ESWAB REG 1ML (MISCELLANEOUS) IMPLANT
SYR 30ML LL (SYRINGE) ×2 IMPLANT
SYR 3ML LL SCALE MARK (SYRINGE) ×2 IMPLANT
TOWEL GREEN STERILE FF (TOWEL DISPOSABLE) ×2 IMPLANT
TRAY ENT MC OR (CUSTOM PROCEDURE TRAY) ×2 IMPLANT
TUBE CONNECTING 12X1/4 (SUCTIONS) ×2 IMPLANT
TUBING STRAIGHTSHOT EPS 5PK (TUBING) ×2 IMPLANT

## 2024-02-16 NOTE — Anesthesia Preprocedure Evaluation (Addendum)
Anesthesia Evaluation  Patient identified by MRN, date of birth, ID band  Reviewed: Allergy & Precautions, NPO status , Patient's Chart, lab work & pertinent test results  History of Anesthesia Complications Negative for: history of anesthetic complications  Airway Mallampati: I  TM Distance: >3 FB Neck ROM: Full    Dental  (+) Dental Advisory Given   Pulmonary neg shortness of breath, neg sleep apnea, neg COPD, neg recent URI   breath sounds clear to auscultation       Cardiovascular hypertension, Pt. on medications and Pt. on home beta blockers (-) angina (-) Past MI and (-) CHF  Rhythm:Regular     Neuro/Psych  Headaches TIA Neuromuscular disease    GI/Hepatic ,GERD  Medicated,,  Endo/Other    Renal/GU Renal InsufficiencyRenal diseaseLab Results      Component                Value               Date                      NA                       137                 01/28/2024                K                        4.5                 01/28/2024                CO2                      23                  01/28/2024                GLUCOSE                  99                  01/28/2024                BUN                      14                  01/28/2024                CREATININE               1.36 (H)            01/28/2024                CALCIUM                  9.3                 01/28/2024                GFRNONAA                 54 (L)              01/28/2024  Musculoskeletal  (+) Arthritis ,    Abdominal   Peds  Hematology  (+) Blood dyscrasia, anemia Lab Results      Component                Value               Date                      WBC                      4.0                 01/28/2024                HGB                      10.1 (L)            01/28/2024                HCT                      30.7 (L)            01/28/2024                MCV                      104.4 (H)            01/28/2024                PLT                      168                 01/28/2024              Anesthesia Other Findings   Reproductive/Obstetrics                             Anesthesia Physical Anesthesia Plan  ASA: 3  Anesthesia Plan: General   Post-op Pain Management: Tylenol PO (pre-op)*   Induction: Intravenous  PONV Risk Score and Plan: 2 and Ondansetron and Dexamethasone  Airway Management Planned: Oral ETT  Additional Equipment: None  Intra-op Plan:   Post-operative Plan: Extubation in OR  Informed Consent: I have reviewed the patients History and Physical, chart, labs and discussed the procedure including the risks, benefits and alternatives for the proposed anesthesia with the patient or authorized representative who has indicated his/her understanding and acceptance.     Dental advisory given  Plan Discussed with: CRNA  Anesthesia Plan Comments:        Anesthesia Quick Evaluation

## 2024-02-16 NOTE — Op Note (Signed)
OPERATIVE NOTE  Johnny Lucas Date/Time of Admission: 02/16/2024  1:26 PM  CSN: [id number] Attending Provider: [name], MD Room/Bed: MCPO/NONE DOB: [birth year] Age: 78 y.o.   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; Chronic maxillary sinusitis; Right facial numbness; Trigeminal sensory loss; Edema of right orbit  Procedure:  Right partial maxillectomy with extraction of remaining right maxillary molar, right first premolar and canine (CPT 31225 modifier 52) Revision right maxillary antrostomy with tissue removal (95621)  Anesthesia: General  Surgeon(s): Mervin Kung, MD  Staff: Circulator: Virgel Bouquet, RN Scrub Person: Perez-Vasquez, Tiffany Circulator Assistant: Precious Reel, RN  Implants: * No implants in log *  Specimens: ID Type Source Tests Collected by Time Destination  A : Right maxillary sinus Body Fluid Path fluid FUNGUS CULTURE WITH STAIN, AEROBIC/ANAEROBIC CULTURE W GRAM STAIN (SURGICAL/DEEP WOUND) [name], MD 02/16/2024 1726   B : FROZEN Right maxillary sinus mucosa rule out invasive fungal sinusitus Tissue PATH ENT excision FUNGUS CULTURE WITH STAIN, AEROBIC/ANAEROBIC CULTURE W GRAM STAIN (SURGICAL/DEEP WOUND) [name], MD 02/16/2024 1746     Complications: none  EBL: 150 ML  IVF: Per anesthesia ML  Condition: stable  Operative Findings:  Gross devascularized/necrotic bone of the right inferior maxilla/maxillary alveolus involving teeth 1 maxillary molar, 1 premolar and the canine resected to viable bone tissue (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 Frozen sections taken to evaluate for invasive fungal elements. Maxillary sinus mucosa with moderate edema, pus but vascularized without any  nonviable mucosal tissue. Endoscopy with vascularized paranasal sinus mucosa (no gross endoscopic evidence of necrosis).   Indications for Procedure: Johnny Lucas is a 78 year old male with a history of multiple myeloma status post prior bone marrow transplant with neutropenia, history of chronic rhinosinusitis status post prior bilateral functional endoscopic sinus surgery with me March 25, 2023 which mostly grew dental pathogens.  Has a history of chronic dental infections.  He presented to my office 2 days ago February 17 with a complaint of 2 to 3 months of right facial numbness which developed after our last office visit.  He additionally has facial pain.  CT imaging concerning for maxillary sinus infection spreading to the orbit and face.  He presents today for definitive surgical management.  In the preoperative area today patient was found to have devitalized maxillary bone indicating the need for debridement.  Discussed surgical plan in detail with the patient and his son including sinus surgery and extensive oral debridement.  Patient will be admitted postoperatively for antimicrobial therapy and urgent MRI orbit face sinus.  Procedure in detail:  The patient was identified in the preoperative area and consent confirmed the chart.  He was brought to the operating room by anesthesia and a preoperative timeout was performed confirming the patient's identity and procedure to be performed.  Once all were in agreement we proceeded with surgery.  General anesthesia was induced and the patient was intubated.  The tube was secured.  The patient was then prepped and draped in standard fashion for procedure of the spine.  A final preoperative pause was performed we proceeded with surgery.  I began with examining the oral cavity.  Due to extensive devitalized maxillary bone a partial maxillectomy with dental instructions was indicated.  I proceeded with extracting the devitalized dentition.  The dental  extractor's were used  to remove the right maxillary canine, right premolar and a right maxillary molar.  All remaining right maxillary dentition lateral to the incisors were removed.  There was extensive devitalized bone present.  Bone rongeurs were used to perform partial maxillectomy.  Viable mucosa was elevated with a subperiosteal elevator to the hard palate and up along the face of the maxilla.  The devitalized bone was further debrided with a #5 powered egg bur. The partial maxillectomy was performed until there was evidence of bleeding bone tissue.  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  Bone and maxillary sinus mucosa was sent for pathology.  Notably frozen sections were taken from the floor of the maxillary sinus mucosa.  Once there was no evidence of any further necrotic bone from the oral cavity I then proceeded with endoscopic sinus surgery.  The 0, 30 and 70 degree rigid nasal endoscope were attached to the video monitoring system and used for the duration of the procedure.  We then proceeded with evaluating the nasal cavity.  On the right, the patient had prior avulsed middle turbinate which was resected with through-cutting and instrumentation this.  Normal.  The nasal cavity and sinus mucosa all appeared pink and viable.  There was no evidence of necrosis in the nasal cavity.  The maxillary sinus ostia from prior surgery had partially stenosed and was revised with through-cutting instrumentation.  Once the maxillary antrostomy was revised the sinus was debrided and cultures were taken.  Copious irrigation was used to washout the sinus.  Endoscopes were passed transorally and transnasally to evaluate the entire sinus without any evidence of necrotic maxillary sinus mucosa.  After hemostasis was achieved with epinephrine soaked gauze pledgets, chitosan nasal splints were then applied.  An orogastric tube was used to suction out the oropharynx.   The patient  was transferred to recovery uneventfully.   Mervin Kung, MD [facility]  02/16/2024

## 2024-02-16 NOTE — H&P (Addendum)
Johnny Lucas is an 78 y.o. male.    Chief Complaint:  Chronic right maxillary sinusitis associated with V2 facial numbness  HPI: Patient presents today for planned elective procedure.  He/she denies any interval change in history since office visit on 02/14/24.   Past Medical History:  Diagnosis Date   Anxiety    Bone metastases 09/10/2016   Breast cancer, male [facility]) 2011   Stage IV breast cancer; radiation and tamoxifen  Overview:  Left breast ca with mets bone  Overview:  METS TO BONE   Bronchitis    uses inhaler   Cough    uses inhaler   GERD (gastroesophageal reflux disease)    Headache    History of blood transfusion    Hypertension    Macular degeneration    bilateral   Memory loss    mild   Multiple myeloma ([name] 02/18/2017   Peripheral neuropathy    feet   Pneumonia    mild case 1-2 yrs ago (2023 or 2024)    Past Surgical History:  Procedure Laterality Date   BACK SURGERY     C2-C3 fusion   BREAST BIOPSY Left    cancer   CATARACT EXTRACTION W/PHACO Left 02/03/2019   Procedure: CATARACT EXTRACTION PHACO AND INTRAOCULAR LENS PLACEMENT (IOC);  Surgeon: Fabio Pierce, MD;  Location: [facility];  Service: Ophthalmology;  Laterality: Left;  CDE: 15.89   CATARACT EXTRACTION W/PHACO Right 07/25/2021   Procedure: CATARACT EXTRACTION PHACO AND INTRAOCULAR LENS PLACEMENT (IOC);  Surgeon: Fabio Pierce, MD;  Location: [facility];  Service: Ophthalmology;  Laterality: Right;  CDE   26.71   COLONOSCOPY     HERNIA REPAIR     PORTACATH PLACEMENT Left 06/10/2018   Procedure: INSERTION PORT-A-CATH;  Surgeon: Lucretia Roers, MD;  Location: [facility];  Service: General;  Laterality: Left;   right arm surgery     bone cancer and rod placed   SINUS ENDO WITH FUSION Bilateral 03/25/2023   Procedure: FUNCTIONAL ENDOSCOPIC SINUS SURGERY WITH FUSION AND NAVIGATION;  Surgeon: [name], MD;  Location: [facility];  Service: ENT;  Laterality: Bilateral;    Family History  Problem Relation  Age of Onset   Stroke Mother    Cancer Maternal Aunt        cancer NOS; died in her 54s   Lung cancer Maternal Uncle        smoker    Social History:  reports that he has never smoked. He has quit using smokeless tobacco.  His smokeless tobacco use included chew. He reports current alcohol use of about 24.0 standard drinks of alcohol per week. He reports that he does not use drugs.  Allergies:  Allergies  Allergen Reactions   Cefepime     Suspected severe thrombocytopenia is a result of cefepime induced antigen platelet destruction    Medications Prior to Admission  Medication Sig Dispense Refill   acetaminophen (TYLENOL) 500 MG tablet Take 1,000 mg by mouth every 8 (eight) hours as needed for mild pain (pain score 1-3) or moderate pain (pain score 4-6).     acyclovir (ZOVIRAX) 800 MG tablet TAKE 1 TABLET TWICE DAILY 180 tablet 3   ALPRAZolam (XANAX) 0.5 MG tablet TAKE 1 TABLET BY MOUTH AT BEDTIME AS NEEDED FOR anxiety / SLEEP 30 tablet 5   aspirin EC 81 MG tablet Take 81 mg by mouth daily.      cetirizine (ZYRTEC) 10 MG tablet Take 10 mg by mouth daily.  dexamethasone (DECADRON) 4 MG tablet TAKE FIVE TABLETS BY MOUTH WEEKLY (Patient taking differently: Take 4 mg by mouth every 30 (thirty) days.) 40 tablet 5   doxycycline (VIBRA-TABS) 100 MG tablet Take 100 mg by mouth 2 (two) times daily.     folic acid (FOLVITE) 1 MG tablet TAKE 1 TABLET BY MOUTH DAILY 90 tablet 2   gabapentin (NEURONTIN) 300 MG capsule Take 1 capsule (300 mg total) by mouth 3 (three) times daily. 180 capsule 2   metoprolol succinate (TOPROL-XL) 100 MG 24 hr tablet Take 100 mg by mouth in the morning.     pantoprazole (PROTONIX) 40 MG tablet TAKE 1 TABLET EVERY OTHER DAY 45 tablet 3   PERIDEX 0.12 % solution Use as directed 15 mLs in the mouth or throat 2 (two) times daily.     pomalidomide (POMALYST) 2 MG capsule TAKE 1 CAPSULE BY MOUTH EVERY DAY FOR 21 DAYS ON FOLLOWED BY 7 DAYS OFF 21 capsule 0   tamoxifen  (NOLVADEX) 20 MG tablet TAKE 1 TABLET EVERY DAY 90 tablet 3   albuterol (VENTOLIN HFA) 108 (90 Base) MCG/ACT inhaler Inhale 2 puffs into the lungs every 6 (six) hours as needed for wheezing or shortness of breath.     denosumab (XGEVA) 120 MG/1.7ML SOLN injection Inject 120 mg into the skin every 30 (thirty) days.  (Patient not taking: Reported on 02/14/2024)     diphenhydrAMINE (BENADRYL) 12.5 MG/5ML elixir Take 12.5 mg by mouth daily as needed for allergies.     Loperamide HCl (IMODIUM PO) Take 2 mg by mouth daily as needed (diarrhea).     ondansetron (ZOFRAN) 8 MG tablet TAKE 1 TABLET BY MOUTH EVERY 8 HOURS AS NEEDED FOR NAUSEA AND VOMITING (Patient not taking: Reported on 02/14/2024) 30 tablet 2   polyethylene glycol (MIRALAX / GLYCOLAX) packet Take 17 g by mouth daily as needed for mild constipation. 14 each 0    No results found. However, due to the size of the patient record, not all encounters were searched. Please check Results Review for a complete set of results. No results found.  ROS: negative other than stated in HPI  Blood pressure (P) 112/74, pulse (P) 61, temperature (P) 98.1 F (36.7 C), temperature source (P) Oral, resp. rate (P) 16, height (P) 5\' 9"  (1.753 m), weight (P) 87 kg, SpO2 (P) 99%.  PHYSICAL EXAM: General: Resting comfortably in NAD.   Normal extraocular movements without pain. R Chemosis. PERRL.   RIGHT facial edema/tenderness and RIGHT v2 hypoesthesia.  Lungs: Non-labored respiratinos  Studies Reviewed: CT maxillofacial 02/10/2024  IMPRESSION: 1. Soft tissue thickening and edema at the right nasolabial fold, consistent with cellulitis. No abscess or drainable fluid collection. 2. Inflammatory change of the inferior and medial extraconal fat of the right orbit, likely orbital cellulitis. 3. Periapical lucency at the root site of the absent tooth 4, which may be a source of the above described infectious/inflammatory processes. 4. Chronic right-sided  sinusitis.     Electronically Signed   By: Deatra Robinson M.D.   On: 02/15/2024 00:55   Assessment/Plan Chronic right maxillary sinusitis associated with V2 numbness. CT Scan concerning for facial and orbital cellulitis.  No extraocular muscle mobility issue/ pain with eye movements or worsening of patients pre-existing visual impairment (20:200 vision , macular degeneration).   Proceed with RIGHT FESS - R Maxillary antrostomy with tissue removal, excision right middle turbinate.   Risks discussed in detail including pain, bleeding, infection, injury to the eye with associated  vision changes, vision loss, injury to the brain with associated CSF leak or meningitis, failure to improve or worsening of sense of smell, nasal synechiae, nasal septal perforation, need for further procedures, risks of general anesthesia.   Discussed overnight admission for IV ABX therapy. Consulted Junie Panning with ID - will start Vancomycin and unasyn post-operatively. Will obtain Aerobic/anaerobic, fungal cultures and tissue cultures. There is some concern for Chronic invasive fungal sinusitis given chronicity and trigeminal sensory loss.     Electronically signed by:  [name], MD  Staff Physician Facial Plastic & Reconstructive Surgery Otolaryngology - Head and Neck Surgery [facility]  02/16/2024, 2:04 PM  Repeat exam prior to surgery today notable for necrotic maxillary bone at site of prior dental extraction extending above right maxillary canine and premolar non-viable. Will perform oral biopsy with possible removal of adjacent teeth if mobile/unstable after oral biopsy , the first premolar is mobile on physical exam. Patient aware and consents to oral biopsy with possible dental extraction.   I expressed my grave concern about chronic invasive fungal sinusitis (not rapidly invasive as he has had facial numbness for 2-68months) and  oral mucosa/sinus mucosal is pink and vascularized - not necrotic.   Patient may end up needing a maxillectomy to resect gross disease once diagnosis and staging of disease completed. I will plan for a post-operative MRI Face with contrast. May consider transfer to [facility] if staging imaging requires aggressive maxillectomy  Electronically signed by:  [name], MD  Staff Physician Facial Plastic & Reconstructive Surgery Otolaryngology - Head and Neck Surgery [facility] [place]

## 2024-02-16 NOTE — Progress Notes (Signed)
Pharmacy Antibiotic Note  Johnny Lucas is a 78 y.o. male admitted on 02/16/2024 with sinusitis + facial cellulitis admitted for I&D by ENT. Pharmacy has been consulted for Vancomycin + Unasyn dosing to start post-op.   Labs from 1/31 reflect SCr 1.36 - which seems to be within the patient's known baseline of 1.2-1.4. Will start scheduled doses but recheck BMET with AM labs.   Plan: - Vancomycin 2g IV x 1 followed by 1250 mg IV every 24 hours (eAUC 460, Vd 0.72, SCr 1.36) to start post-op - Start Unasyn 3g IV every 6 hours to start post-op - Will continue to follow renal function, culture results, LOT, and antibiotic de-escalation plans   Height: (P) 5\' 9"  (175.3 cm) Weight: (P) 87 kg (191 lb 12.8 oz) IBW/kg (Calculated) : (P) 70.7  Temp (24hrs), Avg:98.1 F (36.7 C), Min:98.1 F (36.7 C), Max:98.1 F (36.7 C)  No results for input(s): "WBC", "CREATININE", "LATICACIDVEN", "VANCOTROUGH", "VANCOPEAK", "VANCORANDOM", "GENTTROUGH", "GENTPEAK", "GENTRANDOM", "TOBRATROUGH", "TOBRAPEAK", "TOBRARND", "AMIKACINPEAK", "AMIKACINTROU", "AMIKACIN" in the last 168 hours.  CrCl cannot be calculated (Unknown ideal weight.).    Allergies  Allergen Reactions   Cefepime     Suspected severe thrombocytopenia is a result of cefepime induced antigen platelet destruction    Antimicrobials this admission: Vancomycin 2/19 >> Unasyn 2/19 >>  Microbiology results: To be collected in the OR 2/19  Thank you for allowing pharmacy to be a part of this patient's care.  Georgina Pillion, PharmD, BCPS, BCIDP Infectious Diseases Clinical Pharmacist 02/16/2024 2:53 PM   **Pharmacist phone directory can now be found on [email or web address] ([email or web address]).  Listed under [facility].

## 2024-02-16 NOTE — Progress Notes (Signed)
Messaged RN 2/19 PM. Patient does not have with him, will have someone bring in tomorrow AM

## 2024-02-16 NOTE — Consult Note (Signed)
[facility]    Date of Admission:  02/16/2024     Total days of antibiotics 0              Reason for Consult: Chronic Maxillary Sinusitis with V2 Nerve Irritation ?fungal infection    Referring Provider: UEAVWU  Primary Care Provider: Doreatha Massed, MD   Assessment: Johnny Lucas is a 78 y.o. male   Chronic Maxillary Sinusitis with V2 Nerve Irritation/Facial Numbness -  He has periapical lucency described around absent tooth #4 on CT scan that may be source of this chronic problem. No abscess or drain able fluid. Pretty bad dentition in upper molars that likely need definitive management. Unclear if this is odontogenic microbes vs fungal. Dr. Drue Second has discussed with Dr. Ernestene Kiel - planning on sending to micro and pathology - need to request path to assess fungal stains.  Will check serum fungitell (though this will miss mucor, may be helpful if positive to focus more on candida species empirically).  -Fungitell -A1C -CRP/ESR in AM -Start vancomycin + unasyn after OR cultures    Multiple Myeloma -  Follows with Dr. Kirtland Bouchard, has been doing well on pomalidomide and darzalex (s/e of URIs). Takes dex on days of Darzalex injection.     Medication Management -  Monitor creatinine on vancomycin Monitor platelets / WBC (has history of cefepime induced thrombocytopenia)     Plan: Start unasyn + vancomycin after OR cultures  Please send specimen to micro and send for routine aerobic / anaerobic cultures and fungal cultures  Please send specimen to pathology as well for evaluation and fungal stains.  A1c, CRP/ESR/Fungitell in AM  Daily CBC, Creatinines      Active Problems:   Chronic maxillary sinusitis   Right facial numbness    HPI: Johnny Lucas is a 78 y.o. male admitted with   PMHx: multiple myeloma and chronic rhinosinusitsi with nasal polyps.   2-3 months ago woke up with numbness on right side of nose. Then this spread to the  right side of the face after he had "sinuses acting up". Felt swelling. Some return of feeling around right cheek. Lip under the right nostril is numb up until the septum. Visit with Dr. Ernestene Kiel on 11/12/23 noted ongoing chronic maxillary sinusitis with mucoid drainage and edema. Topical irrigation was recommended but did not help.  He mentions some partial remaining roots to a tooth that required extraction as a possible cause to his problem. Prior to this he had abx in August that did not alleviate his symptoms. He was given oral doxycycline in November that did not help. He continues to have bad headaches with the sinus pain and has been using tylenol for this. Continues to have specific facial numbness on the right cheek to upper lip to the septum.Seems that this may have been ongoing since August with progression in November involving the pain/numbness.   B/L Functional endoscopic sinus surgery 03/25/23. This was helpful to clear out congestion symptoms.  No fevers/chills or sweating. Tolerated augmentin in the past.    Review of Systems: Review of Systems  Constitutional:  Negative for chills, fever and malaise/fatigue.  HENT:  Positive for congestion and sinus pain. Negative for hearing loss.   Eyes:  Negative for blurred vision and pain.  Respiratory:  Negative for cough and shortness of breath.   Cardiovascular:  Negative for chest pain.  Gastrointestinal:  Negative for abdominal pain, nausea and  vomiting.  Genitourinary:  Negative for dysuria.  Musculoskeletal:  Negative for myalgias.    Past Medical History:  Diagnosis Date   Anxiety    Bone metastases 09/10/2016   Breast cancer, male [facility]) 2011   Stage IV breast cancer; radiation and tamoxifen  Overview:  Left breast ca with mets bone  Overview:  METS TO BONE   Bronchitis    uses inhaler   Cough    uses inhaler   GERD (gastroesophageal reflux disease)    Headache    History of blood transfusion    Hypertension    Macular  degeneration    bilateral   Memory loss    mild   Multiple myeloma ([name] 02/18/2017   Peripheral neuropathy    feet   Pneumonia    mild case 1-2 yrs ago (2023 or 2024)    Social History   Tobacco Use   Smoking status: Never   Smokeless tobacco: Former    Types: Engineer, drilling   Vaping status: Never Used  Substance Use Topics   Alcohol use: Yes    Alcohol/week: 24.0 standard drinks of alcohol    Types: 24 Cans of beer per week    Comment: 3-4 beers a week   Drug use: No    Family History  Problem Relation Age of Onset   Stroke Mother    Cancer Maternal Aunt        cancer NOS; died in her 62s   Lung cancer Maternal Uncle        smoker   Allergies  Allergen Reactions   Cefepime     Suspected severe thrombocytopenia is a result of cefepime induced antigen platelet destruction    OBJECTIVE: Blood pressure (P) 112/74, pulse (P) 61, temperature (P) 98.1 F (36.7 C), temperature source (P) Oral, resp. rate (P) 16, height (P) 5\' 9"  (1.753 m), weight (P) 87 kg, SpO2 (P) 99%.  Physical Exam HENT:     Mouth/Throat:      Comments: Fragmented molar noted with some surrounding erythema     Lab Results Lab Results  Component Value Date   WBC 4.0 01/28/2024   HGB 10.1 (L) 01/28/2024   HCT 30.7 (L) 01/28/2024   MCV 104.4 (H) 01/28/2024   PLT 168 01/28/2024    Lab Results  Component Value Date   CREATININE 1.36 (H) 01/28/2024   BUN 14 01/28/2024   NA 137 01/28/2024   K 4.5 01/28/2024   CL 105 01/28/2024   CO2 23 01/28/2024    Lab Results  Component Value Date   ALT 11 01/28/2024   AST 11 (L) 01/28/2024   ALKPHOS 34 (L) 01/28/2024   BILITOT 0.6 01/28/2024     Microbiology: No results found for this or any previous visit (from the past 240 hours).  Rexene Alberts, MSN, NP-C [facility] Pager: [phone]  02/16/2024 4:34 PM

## 2024-02-16 NOTE — Anesthesia Procedure Notes (Signed)
Procedure Name: Intubation Date/Time: 02/16/2024 4:42 PM  Performed by: Eulah Pont, CRNAPre-anesthesia Checklist: Patient identified, Emergency Drugs available, Suction available and Patient being monitored Patient Re-evaluated:Patient Re-evaluated prior to induction Oxygen Delivery Method: Circle System Utilized Preoxygenation: Pre-oxygenation with 100% oxygen Induction Type: IV induction Ventilation: Mask ventilation without difficulty Laryngoscope Size: Miller and 2 Grade View: Grade I Tube type: Oral Tube size: 7.0 mm Number of attempts: 1 Airway Equipment and Method: Stylet and Oral airway Placement Confirmation: ETT inserted through vocal cords under direct vision, positive ETCO2 and breath sounds checked- equal and bilateral Secured at: 22 cm Tube secured with: Tape Dental Injury: Teeth and Oropharynx as per pre-operative assessment

## 2024-02-16 NOTE — Transfer of Care (Signed)
Immediate Anesthesia Transfer of Care Note  Patient: Johnny Lucas  Procedure(s) Performed: RIGHT ENDOSCOPIC SINUS SURGERY (Right) MAXILLARY ANTROSTOMY WITH TISSUE REMOVAL (Right) EXCISION OF MIDDLE TURBINATE (Right)  Patient Location: PACU  Anesthesia Type:General  Level of Consciousness: awake and drowsy  Airway & Oxygen Therapy: Patient Spontanous Breathing and Patient connected to face mask oxygen  Post-op Assessment: Report given to RN, Post -op Vital signs reviewed and stable, and Patient moving all extremities X 4  Post vital signs: Reviewed and stable  Last Vitals:  Vitals Value Taken Time  BP 148/79 02/16/24 1823  Temp    Pulse 64 02/16/24 1826  Resp 13 02/16/24 1826  SpO2 99 % 02/16/24 1826  Vitals shown include unfiled device data.  Last Pain:  Vitals:   02/16/24 1347  TempSrc: (P) Oral  PainSc: (P) 0-No pain         Complications: No notable events documented.

## 2024-02-16 NOTE — Consult Note (Signed)
ID CONSULT NOTE  Patient was seen, examined,treatment plan was discussed with the  Advance Practice Provider.  I have personally reviewed the clinical findings, labs, imaging studies and management of this patient in detail.  I agree with the documentation, as recorded by stephanie dixon, np  Mr Sermeno reports roughly 3 months of nasal and right cheek numbness and slight burning of lips, right side only, in the V2 distribution with some mildly associated facial swelling to that area. He states that he previously had infected tooth/extraction roughly a year ago. He denies fever or chills and had more recently followed up with dr Ernestene Kiel for evaluation. He was admitted for maxillary sinus debridement given findings on CT. On physical exam, he upper molar and pocket of prior molar extraction have decay and concern that this maybe odontigenic infection that now has tracked to right maxillary sinus--complicated osteomyelitis  After discussion with dr Ernestene Kiel, we are taking the conservative approach to start broad spectrum abtx including starting with vanco, amp/sub, plus micafungin for antifungal.  Will have tissue sent for path for fungal staining as well as aerobic/anaerobic, fungal culture  Agree that mri of maxillofacial region to get better visualization if osteomyelitis is present will also be helpful -to decide on length of therapy.  I have personally spent 85 minutes involved in face-to-face and non-face-to-face activities for this patient on the day of the visit. Professional time spent includes the following activities: Preparing to see the patient (review of tests), Obtaining and/or reviewing separately obtained history (admission/discharge record), Performing a medically appropriate examination and/or evaluation , Ordering medications/tests/procedures, referring and communicating with other health care professionals, Documenting clinical information in the EMR, Independently interpreting results  (not separately reported), Communicating results to the patient/family/caregiver, Counseling and educating the patient/family/caregiver and Care coordination (not separately reported).    Duke Salvia Drue Second MD MPH [facility] [phone]

## 2024-02-17 ENCOUNTER — Encounter (HOSPITAL_COMMUNITY): Payer: Self-pay | Admitting: Otolaryngology

## 2024-02-17 DIAGNOSIS — G589 Mononeuropathy, unspecified: Secondary | ICD-10-CM | POA: Diagnosis not present

## 2024-02-17 DIAGNOSIS — J320 Chronic maxillary sinusitis: Secondary | ICD-10-CM | POA: Diagnosis not present

## 2024-02-17 DIAGNOSIS — B9689 Other specified bacterial agents as the cause of diseases classified elsewhere: Secondary | ICD-10-CM | POA: Diagnosis not present

## 2024-02-17 DIAGNOSIS — R200 Anesthesia of skin: Secondary | ICD-10-CM | POA: Diagnosis not present

## 2024-02-17 LAB — BASIC METABOLIC PANEL
Anion gap: 9 (ref 5–15)
BUN: 20 mg/dL (ref 8–23)
CO2: 20 mmol/L — ABNORMAL LOW (ref 22–32)
Calcium: 7.6 mg/dL — ABNORMAL LOW (ref 8.9–10.3)
Chloride: 107 mmol/L (ref 98–111)
Creatinine, Ser: 1.53 mg/dL — ABNORMAL HIGH (ref 0.61–1.24)
GFR, Estimated: 47 mL/min — ABNORMAL LOW (ref >60)
Glucose, Bld: 172 mg/dL — ABNORMAL HIGH (ref 70–99)
Potassium: 4.4 mmol/L (ref 3.5–5.1)
Sodium: 136 mmol/L (ref 135–145)

## 2024-02-17 LAB — HEMOGLOBIN A1C
Hgb A1c MFr Bld: 5.6 % (ref 4.8–5.6)
Mean Plasma Glucose: 114.02 mg/dL

## 2024-02-17 LAB — SEDIMENTATION RATE: Sed Rate: 38 mm/h — ABNORMAL HIGH (ref 0–16)

## 2024-02-17 LAB — GLUCOSE, CAPILLARY
Glucose-Capillary: 124 mg/dL — ABNORMAL HIGH (ref 70–99)
Glucose-Capillary: 98 mg/dL (ref 70–99)

## 2024-02-17 LAB — C-REACTIVE PROTEIN: CRP: 4.1 mg/dL — ABNORMAL HIGH (ref <1.0)

## 2024-02-17 MED ORDER — CHLORHEXIDINE GLUCONATE 0.12 % MT SOLN
15.0000 mL | Freq: Three times a day (TID) | OROMUCOSAL | Status: DC
  Administered 2024-02-17 – 2024-02-21 (×17): 15 mL via OROMUCOSAL
  Filled 2024-02-17 (×20): qty 15

## 2024-02-17 MED ORDER — DAPTOMYCIN-SODIUM CHLORIDE 700-0.9 MG/100ML-% IV SOLN
700.0000 mg | Freq: Every day | INTRAVENOUS | Status: DC
  Administered 2024-02-17 – 2024-02-21 (×5): 700 mg via INTRAVENOUS
  Filled 2024-02-17 (×5): qty 100

## 2024-02-17 NOTE — Progress Notes (Signed)
Transition of Care [facility]) - Inpatient Brief Assessment   Patient Details  Name: Johnny Lucas MRN: [id number] Date of Birth: 1946/10/16  Transition of Care [facility] CM/SW Contact:    Janae Bridgeman, RN Phone Number: 02/17/2024, 3:23 PM   Clinical Narrative: CM noted that patient is being treated by Dr. Ernestene Kiel and is pending cultures for needed IV antibiotics for home.  Patient has a chest port a cath that is currently accessed and will remain accessed when he returns home for needed home IV antibiotics.  Patient was seen by ID NP and cultures are still pending for determined home IV antibiotics.  I called and spoke with the patient by phone to discuss TOC needs for home.  The patient states that Dr. Ernestene Kiel plans to follow up with him in the am.  The patient was provided with Medicare choice regarding DME services for IV antibiotics and home health agency for [facility] RN and patient did not have a preference for companies for either.  I called Jeri Modena, RNCM with [employer] and she plans to follow up with the patient today to start teaching at the bedside for home infusions.  HH RN order was placed for Dr. Ernestene Kiel to co-sign.  Patient states that he would like his wife/son to have teaching as well to assist him at home with infusions [facility], RN aware and will teach family as well.  Kandee Keen, RNCM with [facility] accepted the patient for [facility] RN services when patient returns home.  Patient is independent in the home and does not have DME in the home at this time.  CM will continue to follow the patient for TOC needs to return home - [facility] will follow up with the patient to order home IV antibiotics once cultures are back and medication has been determined by ID.   Transition of Care Asessment: Insurance and Status: (P) Insurance coverage has been reviewed Patient has primary care physician: (P) Yes Home environment has been reviewed: (P) from home with wife Prior level of function::  (P) INdependent Prior/Current Home Services: (P) No current home services Social Drivers of Health Review: (P) SDOH reviewed needs interventions Readmission risk has been reviewed: (P) Yes Transition of care needs: (P) transition of care needs identified, TOC will continue to follow

## 2024-02-17 NOTE — Progress Notes (Signed)
Update note   D/w hematologist . Stop chemotherapy for 2 weeks due to active infection.   Will update patient   [facility]

## 2024-02-17 NOTE — Progress Notes (Signed)
Pharmacy Antibiotic Note  Johnny Lucas is a 78 y.o. male admitted on 02/16/2024 with  chronic maxillary sinusitis found with devascularized and necrotic bone in R inferior maxillary alveolus w/ dental involvement .  Pharmacy has been consulted for daptomycin dosing.  Plan: Daptomycin 700 mg IV q24h  (roughly 8 mg/kg TBW - BMI 28, rounded to nearest bag size) Discontinue vancomycin Continue amp/sulbactam, micafungin, acyclovir (PTA, prophylactic for immunocompromise) Weekly CK F/u micro/pathology, renal function, further workup  Height: 5\' 9"  (175.3 cm) Weight: 88.2 kg (194 lb 7.1 oz) IBW/kg (Calculated) : 70.7  Temp (24hrs), Avg:98 F (36.7 C), Min:97.2 F (36.2 C), Max:98.4 F (36.9 C)  Recent Labs  Lab 02/17/24 0502  CREATININE 1.53*    Estimated Creatinine Clearance: 45.8 mL/min (A) (by C-G formula based on SCr of 1.53 mg/dL (H)).    Allergies  Allergen Reactions   Cefepime     Suspected severe thrombocytopenia is a result of cefepime induced antigen platelet destruction    Antimicrobials this admission: Vanc x 1 on 2/19 pm Acyclovir 800 PO BID from PTA >>  Unasyn 2/19 >> Micafungin 2/19 >> Dapto 2/20 >>  Weekly CK on Dapto - 2/21:    Microbiology results: 2/19 surgical wound (maxillary sinus): few GPC, few GNR, rare GPR on GS > no growth < 12 hrs to date 2/19 surgical tissue (maxillary sinus): rare GPC in pairs, rare GPR in chains, rare GPR  on GS > no growth < 12 hrs to date 2/19 fungus cx with stain: sent  Thank you for allowing pharmacy to be a part of this patient's care.  Rutherford Nail, PharmD PGY2 Critical Care Pharmacy Resident 02/17/2024 3:27 PM

## 2024-02-17 NOTE — Progress Notes (Signed)
[facility]  Date of Admission:  02/16/2024      Total days of antibiotics           ASSESSMENT: Johnny Lucas is a 78 y.o. male admitted with:   Chronic Maxillary Sinusitis with V2 Nerve Irritation/Facial Numbness -  Op report noted with "gross evidence of devascularized and necrotic bone in right inferior maxilla/maxillary alveolus involving several teeth.". MRI Orbits does not reveal definative osteomyelitis of the maxilla/orbits - I discussed with neuro rad and this will be updated on addendum tonight. D/W Dr. Ernestene Kiel who spoke with pathology lab - there does look to be concern for invasive fungal elements on eval of frozen section sent. Polymicrobial on gram stains from tissue and bone.  I think we should treat aggressively for osteomyelitis in this case and plan for a long course of antibiotics for his management given immunosuppressed. We discussed this with Mr. Mccrystal and his son today.  -Fungitell F/U -Start vancomycin + unasyn after OR cultures    Multiple Myeloma -  Follows with Dr. Kirtland Bouchard, has been doing well on pomalidomide and darzalex (s/e of URIs). Takes dex on days of Darzalex injection.     Medication Management -  Monitor creatinine on vancomycin Monitor platelets / WBC (has history of cefepime induced thrombocytopenia)   Disposition -  He is quite anxious to go home. I think with the fungal elements detected we should wait to ensure we get the additional feedback to characterize the elements seen better so we can make the best choice here. Micafungin will cover candidal species but not broad enough for other considerations (mucor, ie).   Vascular Access - He already has port and does not need PICC placement. He understands the infusions will be daily and need to maintain these himself at home with family support. Will start the discussions with home health.     PLAN: TOC referral for home abx  Continue vanc/unasyn/mica for  now   Principal Problem:   Maxillary sinusitis, chronic Active Problems:   Chronic maxillary sinusitis   Right facial numbness    acyclovir  800 mg Oral BID   chlorhexidine  15 mL Mouth/Throat TID PC & HS   Chlorhexidine Gluconate Cloth  6 each Topical Daily   gabapentin  300 mg Oral TID   metoprolol succinate  100 mg Oral Daily   sodium chloride  2 spray Each Nare TID   sodium chloride flush  10-40 mL Intracatheter Q12H   tamoxifen  20 mg Oral Daily    SUBJECTIVE: Doing OK today. Nothing changed much since surgery. Taking some POs in now.   Review of Systems: Review of Systems  Constitutional:  Negative for chills and fever.  Gastrointestinal:  Negative for abdominal pain, nausea and vomiting.    Allergies  Allergen Reactions   Cefepime     Suspected severe thrombocytopenia is a result of cefepime induced antigen platelet destruction    OBJECTIVE: Vitals:   02/16/24 2215 02/17/24 0013 02/17/24 0439 02/17/24 0745  BP: 119/62 116/63 103/77 (!) 108/58  Pulse: (!) 58 (!) 58 (!) 53 (!) 54  Resp: 17 17 17 17   Temp: 97.9 F (36.6 C) 98.4 F (36.9 C) 98.2 F (36.8 C) (!) 97.2 F (36.2 C)  TempSrc: Oral Oral Oral Oral  SpO2: 97% 99% 98% 98%  Weight:      Height:       Body mass index is 28.71 kg/m.  Physical Exam Vitals reviewed.  Constitutional:      Appearance: Normal appearance.  HENT:     Head:      Comments: Some increased facial redness today vs yesterday. Swelling about the same. Lower lid edema noted with clear watery eyes R>L.      Mouth/Throat:      Comments: Packed tooth  Neurological:     Mental Status: He is alert.     Lab Results Lab Results  Component Value Date   WBC 4.0 01/28/2024   HGB 10.1 (L) 01/28/2024   HCT 30.7 (L) 01/28/2024   MCV 104.4 (H) 01/28/2024   PLT 168 01/28/2024    Lab Results  Component Value Date   CREATININE 1.53 (H) 02/17/2024   BUN 20 02/17/2024   NA 136 02/17/2024   K 4.4 02/17/2024   CL 107  02/17/2024   CO2 20 (L) 02/17/2024    Lab Results  Component Value Date   ALT 11 01/28/2024   AST 11 (L) 01/28/2024   ALKPHOS 34 (L) 01/28/2024   BILITOT 0.6 01/28/2024     Microbiology: Recent Results (from the past 240 hours)  Aerobic/Anaerobic Culture w Gram Stain (surgical/deep wound)     Status: None (Preliminary result)   Collection Time: 02/16/24  5:26 PM   Specimen: Path fluid; Body Fluid  Result Value Ref Range Status   Specimen Description TISSUE  Final   Special Requests RIGHT MAXILLARY SINUS  Final   Gram Stain   Final    NO WBC SEEN RARE GRAM POSITIVE COCCI IN PAIRS RARE GRAM POSITIVE COCCI IN CHAINS RARE GRAM POSITIVE RODS CRITICAL RESULT CALLED TO, READ BACK BY AND VERIFIED WITH: MD STEVEN HOSHAL ON 02/16/24 @ 1859 BY DRT    Culture   Final    NO GROWTH < 12 HOURS Performed at [facility], [address] [place], [place] [address]    Report Status PENDING  Incomplete  Aerobic/Anaerobic Culture w Gram Stain (surgical/deep wound)     Status: None (Preliminary result)   Collection Time: 02/16/24  5:26 PM   Specimen: PATH ENT excision; Tissue  Result Value Ref Range Status   Specimen Description WOUND  Final   Special Requests SWAB OF MAXILLARY SINUS SAMPLE A  Final   Gram Stain   Final    FEW WBC PRESENT, PREDOMINANTLY PMN FEW GRAM POSITIVE COCCI FEW GRAM NEGATIVE RODS RARE GRAM POSITIVE RODS    Culture   Final    NO GROWTH < 12 HOURS Performed at [facility], [address], [place], [place] [address]    Report Status PENDING  Incomplete    Rexene Alberts, MSN, NP-C [facility]  [email or web address] Pager: [phone] Office: [phone] [facility] Main Line: [phone] *Secure Chat Communication Welcome

## 2024-02-17 NOTE — Progress Notes (Addendum)
Prior-To-Admission Oral Chemotherapy for Treatment of Oncologic Disease   Request from Dr. Ernestene Kiel to continue prior-to-admission oral chemotherapy regimen of pomalidomide 2 mg daily x 21 days, off 7 days (reported on day #14 of 21).  Procedure Per Pharmacy & Therapeutics Committee Policy: Orders for continuation of home oral chemotherapy for treatment of an oncologic disease will be held unless approved by an oncologist during current admission.    For patients receiving oncology care at [facility], inpatient pharmacist contacts patient's oncologist during regular office hours to review. If earlier review is medically necessary, attending physician consults [facility] on-call oncologist   For patients receiving oncology care outside of [facility], attending physician consults patient's oncologist to review. If this oncologist or their coverage cannot be reached, attending physician consults [facility] on-call oncologist   Per Dr. Ellin Saba, hold pomiladomide and weekly dexamethasone for 2 weeks. Dr. Ernestene Kiel aware and patient informed. Patient reports monthly dexamethasone with treatment at [facility]  Dennie Fetters, RPh 02/17/2024, 2:53 PM

## 2024-02-17 NOTE — Plan of Care (Signed)
  Problem: Education: Goal: Knowledge of General Education information will improve Description: Including pain rating scale, medication(s)/side effects and non-pharmacologic comfort measures 02/17/2024 1742 by Virgina Norfolk, RN Outcome: Progressing 02/17/2024 1742 by Virgina Norfolk, RN Outcome: Progressing   Problem: Health Behavior/Discharge Planning: Goal: Ability to manage health-related needs will improve Outcome: Progressing   Problem: Clinical Measurements: Goal: Ability to maintain clinical measurements within normal limits will improve Outcome: Progressing Goal: Will remain free from infection Outcome: Progressing Goal: Diagnostic test results will improve Outcome: Progressing Goal: Respiratory complications will improve Outcome: Progressing Goal: Cardiovascular complication will be avoided Outcome: Progressing   Problem: Activity: Goal: Risk for activity intolerance will decrease Outcome: Progressing   Problem: Nutrition: Goal: Adequate nutrition will be maintained Outcome: Progressing   Problem: Coping: Goal: Level of anxiety will decrease Outcome: Progressing   Problem: Elimination: Goal: Will not experience complications related to bowel motility Outcome: Progressing Goal: Will not experience complications related to urinary retention Outcome: Progressing   Problem: Pain Managment: Goal: General experience of comfort will improve and/or be controlled Outcome: Progressing   Problem: Safety: Goal: Ability to remain free from injury will improve Outcome: Progressing   Problem: Skin Integrity: Goal: Risk for impaired skin integrity will decrease Outcome: Progressing   Problem: Education: Goal: Knowledge of the prescribed therapeutic regimen will improve Outcome: Progressing   Problem: Activity: Goal: Ability to tolerate increased activity will improve Outcome: Progressing   Problem: Health Behavior/Discharge Planning: Goal: Identification of  resources available to assist in meeting health care needs will improve Outcome: Progressing   Problem: Nutrition: Goal: Maintenance of adequate nutrition will improve Outcome: Progressing   Problem: Clinical Measurements: Goal: Complications related to the disease process, condition or treatment will be avoided or minimized Outcome: Progressing   Problem: Respiratory: Goal: Will regain and/or maintain adequate ventilation Outcome: Progressing   Problem: Skin Integrity: Goal: Demonstration of wound healing without infection will improve Outcome: Progressing

## 2024-02-17 NOTE — Progress Notes (Signed)
 PHARMACY CONSULT NOTE FOR:  OUTPATIENT  PARENTERAL ANTIBIOTIC THERAPY (OPAT)  Indication: Sinusitis/osteo Regimen: Ertapenem 1g IV every 24 hours + Daptomycin 700 mg IV every 24 hours + Micafungin 100 mg IV every 24 hours End date: 03/29/24  IV antibiotic discharge orders are pended. To discharging provider:  please sign these orders via discharge navigator,  Select New Orders & click on the button choice - Manage This Unsigned Work.     Thank you for allowing pharmacy to be a part of this patient's care.  Georgina Pillion, PharmD, BCPS, BCIDP Infectious Diseases Clinical Pharmacist 02/21/2024 10:51 AM   **Pharmacist phone directory can now be found on [email or web address] ([email or web address]).  Listed under [facility].

## 2024-02-17 NOTE — Plan of Care (Signed)
   Problem: Activity: Goal: Risk for activity intolerance will decrease Outcome: Progressing   Problem: Nutrition: Goal: Adequate nutrition will be maintained Outcome: Progressing   Problem: Pain Managment: Goal: General experience of comfort will improve and/or be controlled Outcome: Progressing   Problem: Safety: Goal: Ability to remain free from injury will improve Outcome: Progressing

## 2024-02-17 NOTE — Progress Notes (Signed)
OTOLARYNGOLOGY - HEAD AND NECK SURGERY FACIAL PLASTIC & RECONSTRUCTIVE SURGERY PROGRESS NOTE  ID: 78 year old male with history of multiple mylenoma with neutropenia, chronic sinusitis s/p prior FESS (03/25/23) , with 3 months of increasing facial numbness found to have osteomyelitis vs. Chronic invasive fungal sinusitis of the right maxillary sinus at site of prior dental extraction POD#1 (02/17/24) s/p right partial maxillectomy, dental extraction, revision maxillary antrostomy.   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 Frozen sections taken to evaluate for invasive fungal elements. Maxillary sinus mucosa with moderate edema, pus but vascularized without any nonviable mucosal tissue. Endoscopy with vascularized paranasal sinus mucosa (no gross endoscopic evidence of necrosis).     Subjective: MRI completed overnight - demonstrated facial/orbital cellultis, myositis of the pterygoids/masseter/temporalis (masticator space); no abscess On IV Vancomycin, Unasyn, and micafungin per ID OR Cultures with GPC, GNR, GPR Did not receive call back from Pathologist on tissue frozen analysis last night; Called today 8 update at 1210 - concern for fungal species on dead bone  Johnny Lucas states he is doing well. He is eager to go home. States facial swelling has improved. No new symptoms otherwise. Has not ambulated yet. Son at bedside.   Objective: Vital signs in last 24 hours: Temp:  [97.2 F (36.2 C)-98.4 F (36.9 C)] 97.2 F (36.2 C) (02/20 0745) Pulse Rate:  [52-66] 54 (02/20 0745) Resp:  [10-19] 17 (02/20 0745) BP: (103-148)/(58-79) 108/58 (02/20 0745) SpO2:  [93 %-100 %] 98 % (02/20 0745) Weight:  [87 kg-88.2 kg] 88.2 kg (02/19  2012)  Physical exam: General: resting comfortably in NAD Eyes: EOMI, PERRL Nose: Nares clear, no epistaxis OC/OP: Right palate xeroform bolster secured with silk sutures. No bleeding. Oral cavity/palate mucosa pink and viable.  Neck: Soft/supple, no masses Neuro: RIGHT V2 Hypoesthesia. Rest of CN II-XII symmetric and intact.   @LABLAST2 (wbc:2,hgb:2,hct:2,plt:2) Recent Labs    02/17/24 0502  NA 136  K 4.4  CL 107  CO2 20*  GLUCOSE 172*  BUN 20  CREATININE 1.53*  CALCIUM 7.6*    Medications: I have reviewed the patient's current medications.  IMAGING:  MRI Orbit w/wo contrast 02/17/24  FINDINGS: Orbits: There is abnormal contrast enhancement of the extraconal fat of the inferior and medial right orbit. No abnormal enhancement of the extraocular muscles or optic nerve. The globe is normal. Left orbit is normal.   Visualized sinuses: Diffuse right-sided paranasal sinus disease   Soft tissues: Incompletely visualized abnormal contrast enhancement of the right temporalis and pterygoid muscles. Inflammatory change of the right pre maxillary fat.   Limited intracranial: Negative   IMPRESSION: 1. Abnormal contrast enhancement of the extraconal fat of the inferior and medial right orbit, consistent with orbital cellulitis. 2. Diffuse right-sided paranasal sinus disease with abnormal contrast enhancement of the right masticator space and pterygoid muscles, consistent with myositis.     Electronically Signed   By: Deatra Robinson M.D.   On: 02/16/2024 22:20  Assessment/Plan: POD#1 s/p right partial maxillectomy, revision maxillary antrostomy due to concern for osteomyelitis vs. Chronic invasive fungal sinusitis. MRI without evidence of tissue necrosis however does show inflammation/infection of the masticator space, orbital cellulitis and facial cellulitis. Pathologist states concern for fungal species on dead bone awaiting final report and cultures. Overall doing  well  Plan: - Continue IV ABX and Antifungals per ID; appreciate assistance in medical management. Patient has port, does not need PICC. - Packing change POD#2 (replace oral xeroform). Do not plan on inpatient takeback to surgery unless exam worsens (Demonstrates further tissue necrosis). Oral exam should allow adequate surveillance. There were no endoscopic signs of necrosis in the nasal cavity/paranasal sinuses.  - Patient requesting chemotherapy medication pomalidomide 2mg  daily x 7 days; pharmacy consult placed as I cannot order it through EMR  Dispo: Wards   LOS: 1 day   [name] 02/17/2024, 8:13 AM   Electronically signed by:  [name], MD  Staff Physician Facial Plastic & Reconstructive Surgery Otolaryngology - Head and Neck Surgery [facility] [place]  \

## 2024-02-18 DIAGNOSIS — R200 Anesthesia of skin: Secondary | ICD-10-CM | POA: Diagnosis not present

## 2024-02-18 DIAGNOSIS — G589 Mononeuropathy, unspecified: Secondary | ICD-10-CM | POA: Diagnosis not present

## 2024-02-18 DIAGNOSIS — B9689 Other specified bacterial agents as the cause of diseases classified elsewhere: Secondary | ICD-10-CM | POA: Diagnosis not present

## 2024-02-18 DIAGNOSIS — J320 Chronic maxillary sinusitis: Secondary | ICD-10-CM | POA: Diagnosis not present

## 2024-02-18 LAB — GLUCOSE, CAPILLARY
Glucose-Capillary: 90 mg/dL (ref 70–99)
Glucose-Capillary: 92 mg/dL (ref 70–99)
Glucose-Capillary: 99 mg/dL (ref 70–99)
Glucose-Capillary: 110 mg/dL — ABNORMAL HIGH (ref 70–99)
Glucose-Capillary: 112 mg/dL — ABNORMAL HIGH (ref 70–99)

## 2024-02-18 LAB — CK: Total CK: 24 U/L — ABNORMAL LOW (ref 49–397)

## 2024-02-18 MED ORDER — SODIUM CHLORIDE 0.9 % IV SOLN
1.0000 g | Freq: Every day | INTRAVENOUS | Status: DC
  Administered 2024-02-18 – 2024-02-21 (×4): 1 g via INTRAVENOUS
  Filled 2024-02-18 (×4): qty 1000

## 2024-02-18 NOTE — Progress Notes (Signed)
OTOLARYNGOLOGY - HEAD AND NECK SURGERY FACIAL PLASTIC & RECONSTRUCTIVE SURGERY PROGRESS NOTE  ID: 78 year old male with history of multiple mylenoma with neutropenia, chronic sinusitis s/p prior FESS (03/25/23) , with 3 months of increasing facial numbness found to have osteomyelitis vs. Chronic invasive fungal sinusitis of the right maxillary sinus at site of prior dental extraction POD#2 (02/17/24) s/p right partial maxillectomy, dental extraction, revision maxillary antrostomy.   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 Frozen sections taken to evaluate for invasive fungal elements. Maxillary sinus mucosa with moderate edema, pus but vascularized without any nonviable mucosal tissue. Endoscopy with vascularized paranasal sinus mucosa (no gross endoscopic evidence of necrosis).     Subjective: NAEON On IV ABX and Antifungals Cultures and surgical path pending  Doing well. Ambulating on ward. Eager to go home. No new pain or symptoms.   Objective: Vital signs in last 24 hours: Temp:  [97.6 F (36.4 C)-98 F (36.7 C)] 97.6 F (36.4 C) (02/21 0819) Pulse Rate:  [58-70] 65 (02/21 0819) Resp:  [16-18] 18 (02/21 0819) BP: (109-131)/(58-69) 131/69 (02/21 0819) SpO2:  [96 %-98 %] 96 % (02/21 0819)  Physical exam: General: resting comfortably in NAD Eyes: EOMI, PERRL Nose: Nares clear, no epistaxis OC/OP: Right palate xeroform bolster secured with silk sutures; removed. Bone viable without necrosis. Mucosa pink. Xeroform replaced.  Neck: Soft/supple, no masses Neuro: RIGHT V2 Hypoesthesia. Rest of CN II-XII symmetric and intact.   @LABLAST2 (wbc:2,hgb:2,hct:2,plt:2) Recent Labs    02/17/24 0502  NA 136  K 4.4  CL 107   CO2 20*  GLUCOSE 172*  BUN 20  CREATININE 1.53*  CALCIUM 7.6*    Medications: I have reviewed the patient's current medications.  IMAGING:  MRI Orbit w/wo contrast 02/17/24  FINDINGS: Orbits: There is abnormal contrast enhancement of the extraconal fat of the inferior and medial right orbit. No abnormal enhancement of the extraocular muscles or optic nerve. The globe is normal. Left orbit is normal.   Visualized sinuses: Diffuse right-sided paranasal sinus disease   Soft tissues: Incompletely visualized abnormal contrast enhancement of the right temporalis and pterygoid muscles. Inflammatory change of the right pre maxillary fat.   Limited intracranial: Negative   IMPRESSION: 1. Abnormal contrast enhancement of the extraconal fat of the inferior and medial right orbit, consistent with orbital cellulitis. 2. Diffuse right-sided paranasal sinus disease with abnormal contrast enhancement of the right masticator space and pterygoid muscles, consistent with myositis.     Electronically Signed   By: Deatra Robinson M.D.   On: 02/16/2024 22:20  Assessment/Plan: POD#2 s/p right partial maxillectomy, revision maxillary antrostomy due to concern for osteomyelitis vs. Chronic invasive fungal sinusitis. Doing well- first oral cavity packing change today. No further tissue loss visible.   Plan: - Continue IV ABX and Antifungals per ID; appreciate assistance in medical management. Patient has port, does not need PICC. - Packing change oral cavity daily. Oral exam should allow adequate surveillance. There were no endoscopic signs of necrosis in the nasal cavity/paranasal sinuses.  - Stop chemotherapy drugs pomalidomide and dexamethasone per Dr. Ellin Saba given ongoing infection - F/u cultures/surgical path to tailor abx/antifungal therapy for discharge.   Dispo: Wards   LOS: 2 days   [name] 02/18/2024, 8:31 AM   Electronically signed by:  [name], MD  Staff  Physician Facial Plastic & Reconstructive Surgery Otolaryngology - Head and Neck Surgery [facility] [place]  \

## 2024-02-18 NOTE — Anesthesia Postprocedure Evaluation (Signed)
Anesthesia Post Note  Patient: Johnny Lucas  Procedure(s) Performed: RIGHT ENDOSCOPIC SINUS SURGERY (Right) REVISION MAXILLARY ANTROSTOMY WITH TISSUE REMOVAL (Right: Mouth) PARTIAL MAXILLECTOMY AND DENTAL EXTRACTIONS (Right: Mouth)     Patient location during evaluation: PACU Anesthesia Type: General Level of consciousness: awake and alert Pain management: pain level controlled Vital Signs Assessment: post-procedure vital signs reviewed and stable Respiratory status: spontaneous breathing, nonlabored ventilation, respiratory function stable and patient connected to nasal cannula oxygen Cardiovascular status: blood pressure returned to baseline and stable Postop Assessment: no apparent nausea or vomiting Anesthetic complications: no   No notable events documented.  Last Vitals:  Vitals:   02/18/24 0819 02/18/24 0915  BP: 131/69 (!) 127/57  Pulse: 65 61  Resp: 18   Temp: 36.4 C   SpO2: 96%     Last Pain:  Vitals:   02/18/24 0921  TempSrc:   PainSc: 4                  Calden Dorsey S

## 2024-02-18 NOTE — TOC Progression Note (Addendum)
Transition of Care [facility]) - Progression Note    Patient Details  Name: Johnny Lucas MRN: [id number] Date of Birth: 1946/08/09  [facility] of [facility]) CM/SW Contact  Janae Bridgeman, RN Phone Number: 02/18/2024, 8:54 AM  Clinical Narrative:    CM called and spoke with Jeri Modena, RNCM with [employer] and she plans to meet with the patient today for teaching between 3 pm and 4 pm today.  Cultures are still pending at this time for home antibiotic determination.  02/18/24 - Jeri Modena, RNCM plans to teach the patient today and patient will likely discharge home tomorrow.  [name] is aware that OPAT orders were placed by Durwin Nora, NP with ID.  [facility] is set up and plans to follow up with the patient for first visit on Sunday.        Expected Discharge Plan and Services                                               Social Determinants of Health (SDOH) Interventions SDOH Screenings   Food Insecurity: No Food Insecurity (02/17/2024)  Housing: Low Risk  (02/17/2024)  Transportation Needs: No Transportation Needs (02/17/2024)  Utilities: Not At Risk (02/17/2024)  Alcohol Screen: Low Risk  (11/26/2020)  Depression (PHQ2-9): Low Risk  (11/27/2020)  Financial Resource Strain: Low Risk  (11/26/2020)  Physical Activity: Inactive (11/26/2020)  Social Connections: Moderately Integrated (02/17/2024)  Stress: Stress Concern Present (11/26/2020)  Tobacco Use: Medium Risk (02/16/2024)    Readmission Risk Interventions    02/17/2024    3:22 PM  Readmission Risk Prevention Plan  Post Dischage Appt Complete  Medication Screening Complete  Transportation Screening Complete

## 2024-02-18 NOTE — Plan of Care (Signed)
   Problem: Activity: Goal: Risk for activity intolerance will decrease Outcome: Progressing   Problem: Coping: Goal: Level of anxiety will decrease Outcome: Progressing   Problem: Elimination: Goal: Will not experience complications related to bowel motility Outcome: Progressing   Problem: Skin Integrity: Goal: Risk for impaired skin integrity will decrease Outcome: Progressing

## 2024-02-18 NOTE — Progress Notes (Addendum)
[facility]  Date of Admission:  02/16/2024      Total days of antibiotics  3  Unasyn  Daptomycin   Micafungin           ASSESSMENT: Johnny Lucas is a 78 y.o. male admitted with:   Chronic Maxillary Sinusitis with V2 Nerve Irritation/Facial Numbness -   FEW EIKENELLA CORRODENS -   Concern for Invasive Fungal Infection per Pathology initial read -  Op report noted with "gross evidence of devascularized and necrotic bone in right inferior maxilla/maxillary alveolus involving several teeth.". MRI Orbits does not reveal definative osteomyelitis of the maxilla/orbits - I discussed with neuro rad and this will be updated on addendum tonight. D/W Dr. Ernestene Kiel who spoke with pathology lab - there does look to be concern for invasive fungal elements on eval of frozen section sent. Polymicrobial on gram stains from tissue and bone.  I think we should treat aggressively for osteomyelitis in this case and plan for a long course of antibiotics for his management given immunosuppressed. We discussed this with Johnny Lucas and his Lucas today.  -Fungitell pending -Aspergillus Ag collected today.  -Suspect ertapenem + daptomycin + micafungin will be regimen; so far only eikenella spcs showing.    Multiple Myeloma -  Follows with Dr. Kirtland Bouchard, has been doing well on pomalidomide and darzalex (s/e of URIs). Takes dex on days of Darzalex injection.   -On hold per Dr. Kirtland Bouchard which I think is a good idea for at least a month maybe longer  -acyclovir ppx to continue    Medication Management -  Monitor creatinine on daptomycin -Monitor platelets / WBC (has history of cefepime induced thrombocytopenia)   Disposition -  He is quite anxious to go home. Discussed we need to get more info on cultures before we get him home.   ?Thrombocytopenia with Cefepime - Would like to stay away from cephalosporin class - will switch to ertapenem for easier home dose regimen   Vascular Access  - He already has port and does not need PICC placement. He understands the infusions will be daily and need to maintain these himself at home with family support. 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 Cultures are still pending for final call on antibiotics.     PLAN: Continue daptomycin and micafungin.  Switch unasyn to ertapenem     OPAT ORDERS:  Diagnosis: Maxillary sinus osteomyelitis   Culture Result: Eikenella sps (pending), fungal elements on soft tissue and bone (pending)   Allergies  Allergen Reactions   Cefepime     Suspected severe thrombocytopenia is a result of cefepime induced antigen platelet destruction     Discharge antibiotics to be given via PICC line:  PENDING PLAN  Daptomycin 700 mg daily  Ertapenem 1 gm daily Micafungin 100 mg daily    Duration: 6 weeks   End Date: 03-29-24  PORT Care Per Protocol -  Home health RN for IV administration and teaching, line care and labs.    Labs weekly while on IV antibiotics: _x_ CBC with differential __ BMP **TWICE WEEKLY ON VANCOMYCIN  _x_ CMP _x_ CRP __ ESR __ Vancomycin trough TWICE WEEKLY _x_ CK  De-access port after completion   Fax weekly labs to [phone]  Clinic Follow Up Appt: Dr. Drue Lucas 03/08/24 @ 10:30 am   Principal Problem:   Maxillary sinusitis, chronic Active Problems:   Chronic maxillary sinusitis   Right facial numbness  acyclovir  800 mg Oral BID   chlorhexidine  15 mL Mouth/Throat TID PC & HS   Chlorhexidine Gluconate Cloth  6 each Topical Daily   gabapentin  300 mg Oral TID   metoprolol succinate  100 mg Oral Daily   sodium chloride  2 spray Each Nare TID   sodium chloride flush  10-40 mL Intracatheter Q12H   tamoxifen  20 mg Oral Daily    SUBJECTIVE: Doing OK today. Wants to go home   Review of Systems: Review of Systems  Constitutional:  Negative for chills and fever.  Gastrointestinal:  Negative for abdominal pain, nausea and vomiting.    Allergies  Allergen  Reactions   Cefepime     Suspected severe thrombocytopenia is a result of cefepime induced antigen platelet destruction    OBJECTIVE: Vitals:   02/17/24 1540 02/17/24 1957 02/18/24 0819 02/18/24 0915  BP: (!) 109/58 (!) 130/58 131/69 (!) 127/57  Pulse: 70 69 65 61  Resp: 18 18 18    Temp: 97.8 F (36.6 C) 98 F (36.7 C) 97.6 F (36.4 C)   TempSrc: Oral Oral Oral   SpO2: 97% 97% 96%   Weight:      Height:       Body mass index is 28.71 kg/m.  Physical Exam Vitals reviewed.  Constitutional:      Appearance: Normal appearance.  HENT:     Head:      Comments: Some increased facial redness today vs yesterday. Swelling about the same. Lower lid edema noted with clear watery eyes R>L.      Mouth/Throat:      Comments: Packed tooth  Neurological:     Mental Status: He is alert.     Lab Results Lab Results  Component Value Date   WBC 4.0 01/28/2024   HGB 10.1 (L) 01/28/2024   HCT 30.7 (L) 01/28/2024   MCV 104.4 (H) 01/28/2024   PLT 168 01/28/2024    Lab Results  Component Value Date   CREATININE 1.53 (H) 02/17/2024   BUN 20 02/17/2024   NA 136 02/17/2024   K 4.4 02/17/2024   CL 107 02/17/2024   CO2 20 (L) 02/17/2024    Lab Results  Component Value Date   ALT 11 01/28/2024   AST 11 (L) 01/28/2024   ALKPHOS 34 (L) 01/28/2024   BILITOT 0.6 01/28/2024     Microbiology: Recent Results (from the past 240 hours)  Aerobic/Anaerobic Culture w Gram Stain (surgical/deep wound)     Status: None (Preliminary result)   Collection Time: 02/16/24  5:26 PM   Specimen: Path fluid; Body Fluid  Result Value Ref Range Status   Specimen Description TISSUE  Final   Special Requests RIGHT MAXILLARY SINUS  Final   Gram Stain   Final    NO WBC SEEN RARE GRAM POSITIVE COCCI IN PAIRS RARE GRAM POSITIVE COCCI IN CHAINS RARE GRAM POSITIVE RODS CRITICAL RESULT CALLED TO, READ BACK BY AND VERIFIED WITH: MD STEVEN HOSHAL ON 02/16/24 @ 1859 BY DRT Performed at [facility], [address], [place], [place] [address]    Culture   Final    FEW EIKENELLA CORRODENS Usually susceptible to penicillin and other beta lactam agents,quinolones,macrolides and tetracyclines. CULTURE REINCUBATED FOR BETTER GROWTH NO ANAEROBES ISOLATED; CULTURE IN PROGRESS FOR 5 DAYS    Report Status PENDING  Incomplete  Aerobic/Anaerobic Culture w Gram Stain (surgical/deep wound)     Status: None (Preliminary result)   Collection Time: 02/16/24  5:26  PM   Specimen: PATH ENT excision; Tissue  Result Value Ref Range Status   Specimen Description WOUND  Final   Special Requests SWAB OF MAXILLARY SINUS SAMPLE A  Final   Gram Stain   Final    FEW WBC PRESENT, PREDOMINANTLY PMN FEW GRAM POSITIVE COCCI FEW GRAM NEGATIVE RODS RARE GRAM POSITIVE RODS    Culture   Final    FEW EIKENELLA CORRODENS Usually susceptible to penicillin and other beta lactam agents,quinolones,macrolides and tetracyclines. CULTURE REINCUBATED FOR BETTER GROWTH Performed at [facility], [address], [place], [place] [address]    Report Status PENDING  Incomplete    Rexene Alberts, MSN, NP-C [facility]  [email or web address] Pager: [phone] Office: [phone] [facility] Main Line: [phone] *Secure Chat Communication Welcome

## 2024-02-19 ENCOUNTER — Other Ambulatory Visit: Payer: Self-pay

## 2024-02-19 LAB — GLUCOSE, CAPILLARY
Glucose-Capillary: 91 mg/dL (ref 70–99)
Glucose-Capillary: 107 mg/dL — ABNORMAL HIGH (ref 70–99)
Glucose-Capillary: 88 mg/dL (ref 70–99)
Glucose-Capillary: 113 mg/dL — ABNORMAL HIGH (ref 70–99)

## 2024-02-19 NOTE — Progress Notes (Signed)
 Subjective: Doing well, no complaints.  Objective: Vital signs in last 24 hours: Temp:  [97.8 F (36.6 C)-98.6 F (37 C)] 97.8 F (36.6 C) (02/22 0735) Pulse Rate:  [58-70] 70 (02/22 0735) Resp:  [18] 18 (02/22 0735) BP: (115-136)/(65-80) 115/80 (02/22 0735) SpO2:  [97 %-100 %] 97 % (02/22 0735) Weight change:  Last BM Date : 02/15/24  Intake/Output from previous day: 02/21 0701 - 02/22 0700 In: -  Out: 485 [Urine:485] Intake/Output this shift: No intake/output data recorded.  PHYSICAL EXAM: Awake and alert, breathing and speech are normal.  Packing changed.  Wound is clean and healthy.  Lab Results: No results for input(s): "WBC", "HGB", "HCT", "PLT" in the last 72 hours. BMET Recent Labs    02/17/24 0502  NA 136  K 4.4  CL 107  CO2 20*  GLUCOSE 172*  BUN 20  CREATININE 1.53*  CALCIUM 7.6*    Studies/Results: No results found.  Medications: I have reviewed the patient's current medications.  Assessment/Plan: Stable postop.  Continue empiric treatment and daily packing changes.  Awaiting final culture results.  LOS: 3 days      Serena Colonel 02/19/2024, 10:52 AM

## 2024-02-19 NOTE — Progress Notes (Signed)
 Received message from [facility] with [facility]. [name] needs confirmation if the pt is going home today to mix and deliver the ABX and to make sure that the nurse leaves port accessed at D/C. Sent a secure chat to [place], LPN, [place], RN and Dr. Ernestene Kiel. Dr. Ernestene Kiel is off and he asked that we contact Dr Pollyann Kennedy. Per Dr. Pollyann Kennedy, we're still waiting for cultures. [name] and Denyse Amass with [facility] notified that we are still waiting for the cultures.

## 2024-02-20 LAB — GLUCOSE, CAPILLARY: Glucose-Capillary: 134 mg/dL — ABNORMAL HIGH (ref 70–99)

## 2024-02-20 NOTE — Progress Notes (Signed)
 ID PROGRESS NOTE  78yo M with myelosuppression found to have maxillary sinusitis/osteomyelitis from dental infection S/p teeth extraction and debridement   Cx: showing eikenella and strep anginosis; cs still pending; fungal cultures still pending  Plan: 6 wk with daptomycin/ertapenem/micafungin -will finalize tomorrow - anticipate to d/c tomorrow

## 2024-02-20 NOTE — Progress Notes (Signed)
 Subjective: Doing well, no complaints.  He is anxious to go home.  Objective: Vital signs in last 24 hours: Temp:  [97.9 F (36.6 C)-98 F (36.7 C)] 97.9 F (36.6 C) (02/23 0726) Pulse Rate:  [60-63] 63 (02/23 0726) Resp:  [16-18] 18 (02/23 0726) BP: (139-157)/(66-95) 139/67 (02/23 0726) SpO2:  [97 %-100 %] 98 % (02/23 0726) Weight change:  Last BM Date : 02/15/24  Intake/Output from previous day: 02/22 0701 - 02/23 0700 In: -  Out: 500 [Urine:500] Intake/Output this shift: No intake/output data recorded.  PHYSICAL EXAM: Awake and alert.  Packing changed.  Wound is healing nicely.  No signs of active infection.  Lab Results: No results for input(s): "WBC", "HGB", "HCT", "PLT" in the last 72 hours. BMET No results for input(s): "NA", "K", "CL", "CO2", "GLUCOSE", "BUN", "CREATININE", "CALCIUM" in the last 72 hours.  Studies/Results: No results found.  Medications: I have reviewed the patient's current medications.  Assessment/Plan: Stable exam.  Awaiting final culture result on second organism.  Anticipate could be ready tomorrow since tomorrow is 5 days.  Continue to observe.  LOS: 4 days       Serena Colonel 02/20/2024, 9:14 AM

## 2024-02-20 NOTE — Plan of Care (Signed)
   Problem: Education: Goal: Knowledge of General Education information will improve Description: Including pain rating scale, medication(s)/side effects and non-pharmacologic comfort measures Outcome: Progressing   Problem: Health Behavior/Discharge Planning: Goal: Ability to manage health-related needs will improve Outcome: Progressing   Problem: Clinical Measurements: Goal: Ability to maintain clinical measurements within normal limits will improve Outcome: Progressing Goal: Will remain free from infection Outcome: Progressing Goal: Diagnostic test results will improve Outcome: Progressing Goal: Respiratory complications will improve Outcome: Progressing Goal: Cardiovascular complication will be avoided Outcome: Progressing   Problem: Activity: Goal: Risk for activity intolerance will decrease Outcome: Progressing   Problem: Nutrition: Goal: Adequate nutrition will be maintained Outcome: Progressing   Problem: Coping: Goal: Level of anxiety will decrease Outcome: Progressing   Problem: Elimination: Goal: Will not experience complications related to bowel motility Outcome: Progressing Goal: Will not experience complications related to urinary retention Outcome: Progressing   Problem: Pain Managment: Goal: General experience of comfort will improve and/or be controlled Outcome: Progressing   Problem: Safety: Goal: Ability to remain free from injury will improve Outcome: Progressing   Problem: Skin Integrity: Goal: Risk for impaired skin integrity will decrease Outcome: Progressing   Problem: Education: Goal: Knowledge of the prescribed therapeutic regimen will improve Outcome: Progressing   Problem: Activity: Goal: Ability to tolerate increased activity will improve Outcome: Progressing   Problem: Health Behavior/Discharge Planning: Goal: Identification of resources available to assist in meeting health care needs will improve Outcome: Progressing    Problem: Nutrition: Goal: Maintenance of adequate nutrition will improve Outcome: Progressing   Problem: Clinical Measurements: Goal: Complications related to the disease process, condition or treatment will be avoided or minimized Outcome: Progressing   Problem: Respiratory: Goal: Will regain and/or maintain adequate ventilation Outcome: Progressing   Problem: Skin Integrity: Goal: Demonstration of wound healing without infection will improve Outcome: Progressing

## 2024-02-20 NOTE — Progress Notes (Signed)
 Notified [name] with [facility] and Denyse Amass with [facility] that pt may be DC tomorrow. Awaiting final culture result.

## 2024-02-20 NOTE — Progress Notes (Signed)
 Pharmacy Antibiotic Note  Johnny Lucas is a 78 y.o. male admitted on 02/16/2024 with  chronic maxillary sinusitis found with devascularized and necrotic bone in R inferior maxillary alveolus w/ dental involvement .  Pharmacy has been consulted for daptomycin dosing.  Plan: Continue Daptomycin 700 mg IV q24h  (roughly 8 mg/kg TBW - BMI 28, rounded to nearest bag size) Continue ertapenem, micafungin, acyclovir (PTA, prophylactic for immunocompromise) Weekly CK F/u micro/pathology, renal function, further workup  Height: 5\' 9"  (175.3 cm) Weight: 88.2 kg (194 lb 7.1 oz) IBW/kg (Calculated) : 70.7  Temp (24hrs), Avg:98 F (36.7 C), Min:97.9 F (36.6 C), Max:98 F (36.7 C)  Recent Labs  Lab 02/17/24 0502  CREATININE 1.53*    Estimated Creatinine Clearance: 45.8 mL/min (A) (by C-G formula based on SCr of 1.53 mg/dL (H)).    Allergies  Allergen Reactions   Cefepime     Suspected severe thrombocytopenia is a result of cefepime induced antigen platelet destruction    Antimicrobials this admission: Vanc x 1 on 2/19 pm Unasyn 2/19 >>2/21 Acyclovir 800 PO BID from PTA >>  Micafungin 2/19 >> Dapto 2/20 >> Ertapenem 2/21 >>   Weekly CK on Dapto - 2/21: 24   Microbiology results: 2/19 surgical wound (maxillary sinus): few eikenella corrodens 2/19 surgical tissue (maxillary sinus): few eikenella corrodens, few strep anginosis 2/19 fungus cx with stain: no fungus observed  Thank you for involving pharmacy in the patient's care.   Theotis Burrow, PharmD PGY1 Acute Care Pharmacy Resident  02/20/2024 8:11 AM

## 2024-02-21 DIAGNOSIS — M869 Osteomyelitis, unspecified: Secondary | ICD-10-CM

## 2024-02-21 DIAGNOSIS — J320 Chronic maxillary sinusitis: Secondary | ICD-10-CM | POA: Diagnosis not present

## 2024-02-21 DIAGNOSIS — M8640 Chronic osteomyelitis with draining sinus, unspecified site: Secondary | ICD-10-CM

## 2024-02-21 LAB — CBC WITH DIFFERENTIAL/PLATELET
Abs Immature Granulocytes: 0.01 10*3/uL (ref 0.00–0.07)
Basophils Absolute: 0.1 10*3/uL (ref 0.0–0.1)
Basophils Relative: 3 %
Eosinophils Absolute: 0.3 10*3/uL (ref 0.0–0.5)
Eosinophils Relative: 8 %
HCT: 25.9 % — ABNORMAL LOW (ref 39.0–52.0)
Hemoglobin: 8.4 g/dL — ABNORMAL LOW (ref 13.0–17.0)
Immature Granulocytes: 0 %
Lymphocytes Relative: 35 %
Lymphs Abs: 1.2 10*3/uL (ref 0.7–4.0)
MCH: 33.6 pg (ref 26.0–34.0)
MCHC: 32.4 g/dL (ref 30.0–36.0)
MCV: 103.6 fL — ABNORMAL HIGH (ref 80.0–100.0)
Monocytes Absolute: 0.5 10*3/uL (ref 0.1–1.0)
Monocytes Relative: 15 %
Neutro Abs: 1.3 10*3/uL — ABNORMAL LOW (ref 1.7–7.7)
Neutrophils Relative %: 39 %
Platelets: 178 10*3/uL (ref 150–400)
RBC: 2.5 MIL/uL — ABNORMAL LOW (ref 4.22–5.81)
RDW: 14.6 % (ref 11.5–15.5)
WBC: 3.3 10*3/uL — ABNORMAL LOW (ref 4.0–10.5)
nRBC: 0 % (ref 0.0–0.2)

## 2024-02-21 LAB — AEROBIC/ANAEROBIC CULTURE W GRAM STAIN (SURGICAL/DEEP WOUND): Gram Stain: NONE SEEN

## 2024-02-21 MED ORDER — HEPARIN SOD (PORK) LOCK FLUSH 100 UNIT/ML IV SOLN
500.0000 [IU] | INTRAVENOUS | Status: AC | PRN
  Administered 2024-02-21: 500 [IU]

## 2024-02-21 MED ORDER — SALINE SPRAY 0.65 % NA SOLN: 2.0000 | Freq: Three times a day (TID) | NASAL | 1 refills | Status: DC

## 2024-02-21 MED ORDER — MICAFUNGIN SODIUM 50 MG IV SOLR: 100.0000 mg | INTRAVENOUS | 0 refills | Status: DC

## 2024-02-21 MED ORDER — OXYCODONE HCL 5 MG PO TABS: 5.0000 mg | ORAL_TABLET | Freq: Four times a day (QID) | ORAL | 0 refills | Status: AC | PRN

## 2024-02-21 MED ORDER — ERTAPENEM IV (FOR PTA / DISCHARGE USE ONLY)
1.0000 g | INTRAVENOUS | 0 refills | Status: AC
Start: 2024-02-21 — End: 2024-03-29

## 2024-02-21 MED ORDER — CHLORHEXIDINE GLUCONATE 0.12 % MT SOLN: 15.0000 mL | Freq: Three times a day (TID) | OROMUCOSAL | 1 refills | Status: AC

## 2024-02-21 MED ORDER — DAPTOMYCIN IV (FOR PTA / DISCHARGE USE ONLY): 700.0000 mg | INTRAVENOUS | 0 refills | Status: AC

## 2024-02-21 NOTE — Discharge Instructions (Signed)
 DISCHARGE INSTRUCTIONS  Perform daily wound packing with the yellow xeroform gauze into the upper jaw (maxilla) defect from surgery. Continue a soft diet for the next month Use mouth rinse after meals and before bed Take antibiotics as directed.  Follow up with Dr. Ernestene Kiel on Friday 02/25/24.   [name] MD [facility] [address] [facility] [place] [phone], [address] [place], [place] [address] Phone: [phone]

## 2024-02-21 NOTE — Discharge Summary (Signed)
 Physician Discharge Summary  Patient ID: Johnny Lucas MRN: [id number] DOB/AGE: September 09, 1946 78 y.o.  Admit date: 02/16/2024 Discharge date: 02/21/2024  Admission Diagnoses:  Principal Problem:   Maxillary sinusitis, chronic Active Problems:   Chronic maxillary sinusitis   Right facial numbness   Discharge Diagnoses:  Same  Surgeries: Procedure(s): RIGHT ENDOSCOPIC SINUS SURGERY REVISION MAXILLARY ANTROSTOMY WITH TISSUE REMOVAL PARTIAL MAXILLECTOMY AND DENTAL EXTRACTIONS on 02/16/2024   Consultants: Infectious Disease   Discharged Condition: Improved  Hospital Course: BRAIDEN RODMAN is an 78 y.o. male who was admitted 02/16/2024 with a chief complaint of No chief complaint on file. , and found to have a diagnosis of Maxillary sinusitis, chronic.  They were brought to the operating room on 02/16/2024 and underwent the above named procedures.  He underwent daily oral cavity packing changes demonstrating viable tissues without worsening infection. Infectious disease was consulted and assisted in directing therapy which included systemic abx and antifungals throughout his hospitalization and upon discharge. The patient had improvement in facial numbness on the day of discharge.   Physical Exam:  General: Awake and alert, no acute distress Oral cavity: Xeroform replaced. All mucosa pink and viable. No signs of worsening infection. Improvement in right V2 hypoesthesia.  Recent vital signs:  Vitals:   02/21/24 0454 02/21/24 0740  BP: (!) 147/69 137/63  Pulse: (!) 59 60  Resp: 16 17  Temp: 97.9 F (36.6 C) 97.6 F (36.4 C)  SpO2: 98% 100%    Recent laboratory studies:  Results for orders placed or performed during the hospital encounter of 02/16/24  Fungus Culture With Stain   Collection Time: 02/16/24  5:26 PM   Specimen: Path fluid; Body Fluid  Result Value Ref Range   Fungus Stain Final report    Fungus (Mycology) Culture PENDING    Fungal Source TISSUE    Aerobic/Anaerobic Culture w Gram Stain (surgical/deep wound)   Collection Time: 02/16/24  5:26 PM   Specimen: Path fluid; Body Fluid  Result Value Ref Range   Specimen Description TISSUE    Special Requests RIGHT MAXILLARY SINUS    Gram Stain      NO WBC SEEN RARE GRAM POSITIVE COCCI IN PAIRS RARE GRAM POSITIVE COCCI IN CHAINS RARE GRAM POSITIVE RODS CRITICAL RESULT CALLED TO, READ BACK BY AND VERIFIED WITH: MD Larkyn Greenberger ON 02/16/24 @ 1859 BY DRT Performed at [facility], [address] [place], [place] [address]    Culture      FEW EIKENELLA CORRODENS Usually susceptible to penicillin and other beta lactam agents,quinolones,macrolides and tetracyclines. FEW ACTINOMYCES NAESLUNDII Standardized susceptibility testing for this organism is not available. MIXED ANAEROBIC FLORA PRESENT.  CALL LAB IF FURTHER IID REQUIRED.    Report Status 02/21/2024 FINAL   Fungus Culture With Stain   Collection Time: 02/16/24  5:26 PM   Specimen: PATH ENT excision; Tissue  Result Value Ref Range   Fungus Stain Final report    Fungus (Mycology) Culture PENDING    Fungal Source SWAB OF RT MAXILLARY  SINUS   Aerobic/Anaerobic Culture w Gram Stain (surgical/deep wound)   Collection Time: 02/16/24  5:26 PM   Specimen: PATH ENT excision; Tissue  Result Value Ref Range   Specimen Description WOUND    Special Requests SWAB OF MAXILLARY SINUS SAMPLE A    Gram Stain      FEW WBC PRESENT, PREDOMINANTLY PMN FEW GRAM POSITIVE COCCI FEW GRAM NEGATIVE RODS RARE GRAM POSITIVE RODS Performed at [facility], [address] [place], [place] [address]    Culture      FEW EIKENELLA CORRODENS Usually susceptible to penicillin and other beta lactam agents,quinolones,macrolides and tetracyclines. RARE STREPTOCOCCUS ANGINOSIS RARE ACTINOMYCES NAESLUNDII Standardized susceptibility testing for this organism is not available. MIXED ANAEROBIC FLORA PRESENT.  CALL LAB IF FURTHER IID REQUIRED.     Report Status PENDING   Fungus Culture Result   Collection Time: 02/16/24  5:26 PM  Result Value Ref Range   Result 1 Comment   Fungus Culture Result   Collection Time: 02/16/24  5:26 PM  Result Value Ref Range   Result 1 Comment   Basic metabolic panel   Collection Time: 02/17/24  5:02 AM  Result Value Ref Range   Sodium 136 135 - 145 mmol/L   Potassium 4.4 3.5 - 5.1 mmol/L   Chloride 107 98 - 111 mmol/L   CO2 20 (L) 22 - 32 mmol/L   Glucose, Bld 172 (H) 70 - 99 mg/dL   BUN 20 8 - 23 mg/dL   Creatinine, Ser 6.04 (H) 0.61 - 1.24 mg/dL   Calcium 7.6 (L) 8.9 - 10.3 mg/dL   GFR, Estimated 47 (L) >60 mL/min   Anion gap 9 5 - 15  Hemoglobin A1c   Collection Time: 02/17/24  5:02 AM  Result Value Ref Range   Hgb A1c MFr Bld 5.6 4.8 - 5.6 %   Mean Plasma Glucose 114.02 mg/dL  C-reactive protein   Collection Time: 02/17/24  5:02 AM  Result Value Ref Range   CRP 4.1 (H) <1.0 mg/dL  Sedimentation rate   Collection Time: 02/17/24  5:02 AM  Result Value Ref Range   Sed Rate 38 (H) 0 - 16 mm/hr  Glucose, capillary   Collection Time: 02/17/24  8:00 PM  Result Value Ref Range   Glucose-Capillary 124 (H) 70 - 99 mg/dL  Glucose, capillary   Collection Time: 02/17/24 11:57 PM  Result Value Ref Range   Glucose-Capillary 98 70 - 99 mg/dL  CK   Collection Time: 02/18/24  4:01 AM  Result Value Ref Range   Total CK 24 (L) 49 - 397 U/L  Glucose, capillary   Collection Time: 02/18/24  4:53 AM  Result Value Ref Range   Glucose-Capillary 90 70 - 99 mg/dL  Glucose, capillary   Collection Time: 02/18/24  8:57 AM  Result Value Ref Range   Glucose-Capillary 92 70 - 99 mg/dL  Glucose, capillary   Collection Time: 02/18/24  4:22 PM  Result Value Ref Range   Glucose-Capillary 99 70 - 99 mg/dL  Glucose, capillary   Collection Time: 02/18/24  8:43 PM  Result Value Ref Range   Glucose-Capillary 110 (H) 70 - 99 mg/dL  Glucose, capillary   Collection Time: 02/18/24 11:29 PM  Result Value Ref  Range   Glucose-Capillary 112 (H) 70 - 99 mg/dL  Glucose, capillary   Collection Time: 02/19/24  7:30 AM  Result Value Ref Range   Glucose-Capillary 91 70 - 99 mg/dL  Glucose, capillary   Collection Time: 02/19/24 12:28 PM  Result Value Ref Range   Glucose-Capillary 107 (H) 70 - 99 mg/dL  Glucose, capillary   Collection Time: 02/19/24  3:28 PM  Result Value Ref Range   Glucose-Capillary 88 70 - 99 mg/dL  Glucose, capillary   Collection Time: 02/19/24  7:49 PM  Result Value Ref Range   Glucose-Capillary 113 (H) 70 - 99 mg/dL  Glucose, capillary   Collection Time: 02/20/24  8:55 AM  Result  Value Ref Range   Glucose-Capillary 134 (H) 70 - 99 mg/dL  CBC with Differential/Platelet   Collection Time: 02/21/24 10:36 AM  Result Value Ref Range   WBC 3.3 (L) 4.0 - 10.5 K/uL   RBC 2.50 (L) 4.22 - 5.81 MIL/uL   Hemoglobin 8.4 (L) 13.0 - 17.0 g/dL   HCT 16.1 (L) 09.6 - 04.5 %   MCV 103.6 (H) 80.0 - 100.0 fL   MCH 33.6 26.0 - 34.0 pg   MCHC 32.4 30.0 - 36.0 g/dL   RDW 40.9 81.1 - 91.4 %   Platelets 178 150 - 400 K/uL   nRBC 0.0 0.0 - 0.2 %   Neutrophils Relative % 39 %   Neutro Abs 1.3 (L) 1.7 - 7.7 K/uL   Lymphocytes Relative 35 %   Lymphs Abs 1.2 0.7 - 4.0 K/uL   Monocytes Relative 15 %   Monocytes Absolute 0.5 0.1 - 1.0 K/uL   Eosinophils Relative 8 %   Eosinophils Absolute 0.3 0.0 - 0.5 K/uL   Basophils Relative 3 %   Basophils Absolute 0.1 0.0 - 0.1 K/uL   Immature Granulocytes 0 %   Abs Immature Granulocytes 0.01 0.00 - 0.07 K/uL   *Note: Due to a large number of results and/or encounters for the requested time period, some results have not been displayed. A complete set of results can be found in Results Review.    Discharge Medications:   Allergies as of 02/21/2024       Reactions   Cefepime    Suspected severe thrombocytopenia is a result of cefepime induced antigen platelet destruction        Medication List     STOP taking these medications    denosumab  120 MG/1.7ML Soln injection Commonly known as: XGEVA   dexamethasone 4 MG tablet Commonly known as: DECADRON   doxycycline 100 MG tablet Commonly known as: VIBRA-TABS   pomalidomide 2 MG capsule Commonly known as: Pomalyst       TAKE these medications    acetaminophen 500 MG tablet Commonly known as: TYLENOL Take 1,000 mg by mouth every 8 (eight) hours as needed for mild pain (pain score 1-3) or moderate pain (pain score 4-6).   acyclovir 800 MG tablet Commonly known as: ZOVIRAX TAKE 1 TABLET TWICE DAILY   albuterol 108 (90 Base) MCG/ACT inhaler Commonly known as: VENTOLIN HFA Inhale 2 puffs into the lungs every 6 (six) hours as needed for wheezing or shortness of breath.   ALPRAZolam 0.5 MG tablet Commonly known as: XANAX TAKE 1 TABLET BY MOUTH AT BEDTIME AS NEEDED FOR anxiety / SLEEP   aspirin EC 81 MG tablet Take 81 mg by mouth daily.   cetirizine 10 MG tablet Commonly known as: ZYRTEC Take 10 mg by mouth daily.   chlorhexidine 0.12 % solution Commonly known as: Peridex Use as directed 15 mLs in the mouth or throat 4 (four) times daily - after meals and at bedtime for 21 days. What changed: when to take this   daptomycin IVPB Commonly known as: CUBICIN Inject 700 mg into the vein daily. Indication:  Sinusitis/osteo First Dose: Yes Last Day of Therapy:  03/29/24 Labs - Once weekly:  CBC/D, BMP, and CPK Labs - Once weekly: ESR and CRP Method of administration: IV Push Method of administration may be changed at the discretion of home infusion pharmacist based upon assessment of the patient and/or caregiver's ability to self-administer the medication ordered.   diphenhydrAMINE 12.5 MG/5ML elixir Commonly known as:  BENADRYL Take 12.5 mg by mouth daily as needed for allergies.   ertapenem IVPB Commonly known as: INVANZ Inject 1 g into the vein daily. Indication:  Sinusitis/osteo First Dose: Yes Last Day of Therapy:  03/29/24 Labs - Once weekly:  CBC/D and  BMP, Labs - Once weekly: ESR and CRP Method of administration: Mini-Bag Plus / Gravity Method of administration may be changed at the discretion of home infusion pharmacist based upon assessment of the patient and/or caregiver's ability to self-administer the medication ordered.   folic acid 1 MG tablet Commonly known as: FOLVITE TAKE 1 TABLET BY MOUTH DAILY   gabapentin 300 MG capsule Commonly known as: NEURONTIN Take 1 capsule (300 mg total) by mouth 3 (three) times daily.   IMODIUM PO Take 2 mg by mouth daily as needed (diarrhea).   metoprolol succinate 100 MG 24 hr tablet Commonly known as: TOPROL-XL Take 100 mg by mouth in the morning.   micafungin 50 MG injection Commonly known as: MYCAMINE Inject 100 mg into the vein daily. Indication:  Sinusitis/osteo; First Dose: Yes; Last Day of Therapy:  03/29/24; Labs - Once weekly:  CBC/D and BMP, Labs - Once weekly: ESR and CRP; Method of administration: Elastomeric; Method of administration may be changed at the discretion of home infusion pharmacist based upon assessment of the patient and/or caregiver's ability to self-administer the medication ordered.   ondansetron 8 MG tablet Commonly known as: ZOFRAN TAKE 1 TABLET BY MOUTH EVERY 8 HOURS AS NEEDED FOR NAUSEA AND VOMITING   oxyCODONE 5 MG immediate release tablet Commonly known as: Oxy IR/ROXICODONE Take 1 tablet (5 mg total) by mouth every 6 (six) hours as needed for up to 5 days for breakthrough pain.   pantoprazole 40 MG tablet Commonly known as: PROTONIX TAKE 1 TABLET EVERY OTHER DAY   polyethylene glycol 17 g packet Commonly known as: MIRALAX / GLYCOLAX Take 17 g by mouth daily as needed for mild constipation.   sodium chloride 0.65 % Soln nasal spray Commonly known as: OCEAN Place 2 sprays into both nostrils 3 (three) times daily.   tamoxifen 20 MG tablet Commonly known as: NOLVADEX TAKE 1 TABLET EVERY DAY               Discharge Care Instructions  (From  admission, onward)           Start     Ordered   02/21/24 0000  Change dressing on IV access line weekly and PRN  (Home infusion instructions - [facility] )        02/21/24 1122            Diagnostic Studies: MR ORBITS W WO CONTRAST Addendum Date: 02/17/2024 ADDENDUM REPORT: 02/17/2024 19:07 ADDENDUM: There is minimal inclusion of the mandible on this study. There is mild edema within the partially visualized head of the right mandibular condyle. Given that there is marked inflammatory change superficial and deep to the anterior wall of the right maxilla, there is likely osteomyelitis at this location, but there are no specific bone marrow changes visible on this study. Electronically Signed   By: Deatra Robinson M.D.   On: 02/17/2024 19:07   Result Date: 02/17/2024 CLINICAL DATA:  Invasive fungal sinusitis EXAM: MRI OF THE ORBITS WITHOUT AND WITH CONTRAST TECHNIQUE: Multiplanar, multi-echo pulse sequences of the orbits and surrounding structures were acquired including fat saturation techniques, before and after intravenous contrast administration. CONTRAST:  9mL GADAVIST GADOBUTROL 1 MMOL/ML IV SOLN COMPARISON:  02/10/2024 maxillofacial  CT FINDINGS: Orbits: There is abnormal contrast enhancement of the extraconal fat of the inferior and medial right orbit. No abnormal enhancement of the extraocular muscles or optic nerve. The globe is normal. Left orbit is normal. Visualized sinuses: Diffuse right-sided paranasal sinus disease Soft tissues: Incompletely visualized abnormal contrast enhancement of the right temporalis and pterygoid muscles. Inflammatory change of the right pre maxillary fat. Limited intracranial: Negative IMPRESSION: 1. Abnormal contrast enhancement of the extraconal fat of the inferior and medial right orbit, consistent with orbital cellulitis. 2. Diffuse right-sided paranasal sinus disease with abnormal contrast enhancement of the right masticator space and pterygoid  muscles, consistent with myositis. Electronically Signed: By: Deatra Robinson M.D. On: 02/16/2024 22:20   CT MAXILLOFACIAL W & WO CONTRAST Result Date: 02/15/2024 CLINICAL DATA:  Right facial swelling EXAM: CT MAXILLOFACIAL WITHOUT AND WITH CONTRAST TECHNIQUE: Multidetector CT imaging of the maxillofacial structures was performed without and with intravenous contrast. Multiplanar CT image reconstructions were also generated. RADIATION DOSE REDUCTION: This exam was performed according to the departmental dose-optimization program which includes automated exposure control, adjustment of the mA and/or kV according to patient size and/or use of iterative reconstruction technique. CONTRAST:  75mL OMNIPAQUE IOHEXOL 350 MG/ML SOLN COMPARISON:  None Available. FINDINGS: Osseous: Periapical lucency at the root site of the absent tooth 4. No fracture or aggressive osseous lesion. Chronic mucoperiosteal remodeling of the right maxillary, frontal and ethmoid sinuses. Orbits: There is inflammatory change of the inferior and medial extraconal fat of the right orbit Sinuses: Complete opacification of the right maxillary sinus. Moderate mucosal thickening of the right frontal, ethmoid and sphenoid sinuses. Soft tissues: Soft tissue thickening and edema at the right nasolabial fold. No abscess or drainable fluid collection. Limited intracranial: No significant or unexpected finding. IMPRESSION: 1. Soft tissue thickening and edema at the right nasolabial fold, consistent with cellulitis. No abscess or drainable fluid collection. 2. Inflammatory change of the inferior and medial extraconal fat of the right orbit, likely orbital cellulitis. 3. Periapical lucency at the root site of the absent tooth 4, which may be a source of the above described infectious/inflammatory processes. 4. Chronic right-sided sinusitis. Electronically Signed   By: Deatra Robinson M.D.   On: 02/15/2024 00:55    Disposition: Discharge disposition: 01-Home or  Self Care       Discharge Instructions     [facility] pharmacist to adjust dose for Vancomycin, Aminoglycosides and other anti-infective therapies as requested by physician.   Complete by: As directed    [facility] to provide Cath Flo 2mg    Complete by: As directed    Administer for PICC line occlusion and as ordered by physician for other access device issues.   Anaphylaxis Kit: Provided to treat any anaphylactic reaction to the medication being provided to the patient if First Dose or when requested by physician   Complete by: As directed    Epinephrine 1mg /ml vial / amp: Administer 0.3mg  (0.11ml) subcutaneously once for moderate to severe anaphylaxis, nurse to call physician and pharmacy when reaction occurs and call 911 if needed for immediate care   Diphenhydramine 50mg /ml IV vial: Administer 25-50mg  IV/IM PRN for first dose reaction, rash, itching, mild reaction, nurse to call physician and pharmacy when reaction occurs   Sodium Chloride 0.9% NS IV: Administer if needed for hypovolemic blood pressure drop or as ordered by physician after call to physician with anaphylactic reaction   Change dressing on IV access line weekly and PRN   Complete  by: As directed    Flush IV access with Sodium Chloride 0.9% and Heparin 10 units/ml or 100 units/ml   Complete by: As directed    Home infusion instructions - [facility]   Complete by: As directed    Instructions: Flush IV access with Sodium Chloride 0.9% and Heparin 10units/ml or 100units/ml   Change dressing on IV access line: Weekly and PRN   Instructions Cath Flo 2mg : Administer for PICC Line occlusion and as ordered by physician for other access device   [facility] pharmacist to adjust dose for: Vancomycin, Aminoglycosides and other anti-infective therapies as requested by physician   Method of administration may be changed at the discretion of home infusion pharmacist based upon  assessment of the patient and/or caregiver's ability to self-administer the medication ordered   Complete by: As directed         Follow-up Information     [facility] Follow up.   Why: [facility] will provide home antibiotics.        [facility] Follow up.   Specialty: Home Health Services Why: [facility] will provide home health RN.  They will call you in the next 24-48 hours to set up services. Contact information: [address] [place] [address] [phone]         Donetta Potts, MD Follow up.   Specialty: Internal Medicine Why: Call the office and schedule a hospital follow up in the next 7-10 days. Contact information: [address] [place] [address] [phone]         [name], MD Follow up on 02/25/2024.   Specialty: Otolaryngology Why: Wound check. Dr. Harvie Junior nurse will call you to give the exact time. Contact information: [address] [place] [address] [phone]                  Signed: [name] 02/21/2024, 12:37 PM

## 2024-02-21 NOTE — Care Management Important Message (Signed)
 Important Message  Patient Details  Name: Johnny Lucas MRN: [id number] Date of Birth: 09/27/1946   Important Message Given:  Yes - Medicare IM     Dorena Bodo 02/21/2024, 3:12 PM

## 2024-02-21 NOTE — Progress Notes (Signed)
 OTOLARYNGOLOGY - HEAD AND NECK SURGERY FACIAL PLASTIC & RECONSTRUCTIVE SURGERY PROGRESS NOTE  ID: 78 year old male with history of multiple mylenoma with neutropenia, chronic sinusitis s/p prior FESS (03/25/23) , with 3 months of increasing facial numbness found to have osteomyelitis vs. 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 Frozen sections taken to evaluate for invasive fungal elements. Maxillary sinus mucosa with moderate edema, pus but vascularized without any nonviable mucosal tissue. Endoscopy with vascularized paranasal sinus mucosa (no gross endoscopic evidence of necrosis).     Subjective: NAEON On IV ABX and Antifungals Cultures and surgical path pending  Very eager to go home. In the last day has noted return of sensation to his right cheek and upper lip.   Objective: Vital signs in last 24 hours: Temp:  [97.6 F (36.4 C)-98.2 F (36.8 C)] 97.6 F (36.4 C) (02/24 0740) Pulse Rate:  [58-64] 60 (02/24 0740) Resp:  [16-18] 17 (02/24 0740) BP: (137-147)/(63-70) 137/63 (02/24 0740) SpO2:  [98 %-100 %] 100 % (02/24 0740)  Physical exam: General: resting comfortably in NAD Eyes: EOMI, PERRL Nose: Nares clear, no epistaxis OC/OP: Right palate xeroform bolster secured with silk sutures; removed. Bone viable without necrosis. Mucosa pink. Xeroform replaced.  Neck: Soft/supple, no masses Neuro: RIGHT V2 with interval return of sensation to lateral cheek and upper lip. Rest of CN II-XII symmetric and intact.    @LABLAST2 (wbc:2,hgb:2,hct:2,plt:2) No results for input(s): "NA", "K", "CL", "CO2", "GLUCOSE", "BUN", "CREATININE", "CALCIUM" in the last 72 hours.   Medications: I have reviewed the patient's current medications.  IMAGING:  MRI Orbit w/wo contrast 02/17/24  FINDINGS: Orbits: There is abnormal contrast enhancement of the extraconal fat of the inferior and medial right orbit. No abnormal enhancement of the extraocular muscles or optic nerve. The globe is normal. Left orbit is normal.   Visualized sinuses: Diffuse right-sided paranasal sinus disease   Soft tissues: Incompletely visualized abnormal contrast enhancement of the right temporalis and pterygoid muscles. Inflammatory change of the right pre maxillary fat.   Limited intracranial: Negative   IMPRESSION: 1. Abnormal contrast enhancement of the extraconal fat of the inferior and medial right orbit, consistent with orbital cellulitis. 2. Diffuse right-sided paranasal sinus disease with abnormal contrast enhancement of the right masticator space and pterygoid muscles, consistent with myositis.     Electronically Signed   By: Deatra Robinson M.D.   On: 02/16/2024 22:20  Assessment/Plan: POD#4 s/p right partial maxillectomy, revision maxillary antrostomy due to concern for osteomyelitis vs. Chronic invasive fungal sinusitis. Doing well with some return of cheek and lip sensation. Discharge pending final abx plan.   Plan: - Continue IV ABX and Antifungals per ID; appreciate assistance in medical management. Patient has port, does not need PICC. - Packing change oral cavity daily. Oral exam should allow adequate surveillance. There were no endoscopic signs of necrosis in the nasal cavity/paranasal sinuses.  - Stop chemotherapy drugs pomalidomide and dexamethasone per Dr. Ellin Saba given ongoing infection - F/u cultures/surgical path to tailor abx/antifungal therapy for discharge.  - DVT PPX - patient ambulating , did not  want anticoag  Dispo: [facility]. Hopeful  for DC Today pending ID Recs.    LOS: 5 days   [name] 02/21/2024, 8:04 AM   Electronically signed by:  [name], MD  Staff Physician Facial Plastic & Reconstructive Surgery Otolaryngology - Head and Neck Surgery [facility] [place]  \

## 2024-02-21 NOTE — Progress Notes (Addendum)
 [facility]  Date of Admission:  02/16/2024      Total days of antibiotics  3  Unasyn  Daptomycin   Micafungin           ASSESSMENT: Johnny Lucas is a 78 y.o. male admitted with:   Chronic Maxillary Sinusitis with V2 Nerve Irritation/Facial Numbness -   FEW EIKENELLA CORRODENS -   Streptococcus Mitis (repeating susceptibilities) -   Concern for Invasive Fungal Infection per Pathology initial read -   Cultures still pending -  OP report noted with "gross evidence of devascularized and necrotic bone in right inferior maxilla/maxillary alveolus involving several teeth." D/W Dr. Ernestene Kiel who spoke with pathology lab - there does look to be concern for invasive fungal elements on eval of frozen section sent. Polymicrobial on gram stains from tissue and bone -- this report is still pending. Decision to treat aggressively for osteomyelitis that is presumed given clinical description of OR findings. \May be able to come off the dapto if strep is sensitive. Re-running susceptibilities.  -Fungitell pending -Aspergillus Ag pending -FU final cultures and susceptibilities -continue daptomycin, ertapenem and micafungin   Multiple Myeloma -  Follows with Dr. Kirtland Bouchard, has been doing well on pomalidomide and darzalex (s/e of URIs). Takes dex on days of Darzalex injection.   -On hold per Dr. Kirtland Bouchard which I think is a good idea for at least a month, maybe longer  -acyclovir ppx to continue    Medication Management -  Monitor creatinine on daptomycin -Monitor platelets / WBC (has history of cefepime induced thrombocytopenia)   Disposition -  He is quite anxious to go home. Discussed we need to get more info on cultures before we get him home.   ?Thrombocytopenia with Cefepime - Wanted to stay off cephalosporin class and [place] ertapenem   Vascular Access - He already has port and does not need PICC placement.     PLAN: Final recs as below  FU has been arranged   Will continue to monitor cultures after he is discharged.   D/W Dr. Ernestene Kiel and CM team - ready for D/C     OPAT ORDERS:  Diagnosis: Maxillary sinus osteomyelitis   Culture Result: Eikenella sps (pending), fungal elements on soft tissue and bone (pending), strep mitis   Allergies  Allergen Reactions   Cefepime     Suspected severe thrombocytopenia is a result of cefepime induced antigen platelet destruction     Discharge antibiotics to be given via PICC line:  Daptomycin 700 mg daily  Ertapenem 1 gm daily Micafungin 100 mg daily    Duration: 6 weeks   End Date: 03-29-24  PORT Care Per Protocol -  Home health RN for IV administration and teaching, line care and labs.    Labs weekly while on IV antibiotics: _x_ CBC with differential __ BMP **TWICE WEEKLY ON VANCOMYCIN  _x_ CMP _x_ CRP __ ESR __ Vancomycin trough TWICE WEEKLY _x_ CK  De-access port after completion   Fax weekly labs to [phone]  Clinic Follow Up Appt: Dr. Drue Second 03/08/24 @ 10:30 am   Principal Problem:   Maxillary sinusitis, chronic Active Problems:   Chronic maxillary sinusitis   Right facial numbness    acyclovir  800 mg Oral BID   chlorhexidine  15 mL Mouth/Throat TID PC & HS   Chlorhexidine Gluconate Cloth  6 each Topical Daily   gabapentin  300 mg Oral TID   metoprolol succinate  100 mg Oral Daily   sodium chloride  2 spray Each Nare TID   sodium chloride flush  10-40 mL Intracatheter Q12H   tamoxifen  20 mg Oral Daily    SUBJECTIVE: Doing OK today. Wants to go home   Review of Systems: Review of Systems  Constitutional:  Negative for chills and fever.  Gastrointestinal:  Negative for abdominal pain, nausea and vomiting.    Allergies  Allergen Reactions   Cefepime     Suspected severe thrombocytopenia is a result of cefepime induced antigen platelet destruction    OBJECTIVE: Vitals:   02/20/24 1507 02/20/24 2013 02/21/24 0454 02/21/24 0740  BP: (!) 147/69 (!)  143/70 (!) 147/69 137/63  Pulse: (!) 58 64 (!) 59 60  Resp: 18 18 16 17   Temp: 98.2 F (36.8 C) 98.1 F (36.7 C) 97.9 F (36.6 C) 97.6 F (36.4 C)  TempSrc: Oral Oral Oral Oral  SpO2: 99% 98% 98% 100%  Weight:      Height:       Body mass index is 28.71 kg/m.  Physical Exam Vitals reviewed.  Constitutional:      Appearance: Normal appearance.  HENT:     Head:      Comments: Some increased facial redness today vs yesterday. Swelling about the same. Lower lid edema noted with clear watery eyes R>L.      Mouth/Throat:      Comments: Packed tooth  Neurological:     Mental Status: He is alert.     Lab Results Lab Results  Component Value Date   WBC 4.0 01/28/2024   HGB 10.1 (L) 01/28/2024   HCT 30.7 (L) 01/28/2024   MCV 104.4 (H) 01/28/2024   PLT 168 01/28/2024    Lab Results  Component Value Date   CREATININE 1.53 (H) 02/17/2024   BUN 20 02/17/2024   NA 136 02/17/2024   K 4.4 02/17/2024   CL 107 02/17/2024   CO2 20 (L) 02/17/2024    Lab Results  Component Value Date   ALT 11 01/28/2024   AST 11 (L) 01/28/2024   ALKPHOS 34 (L) 01/28/2024   BILITOT 0.6 01/28/2024     Microbiology: Recent Results (from the past 240 hours)  Fungus Culture With Stain     Status: None (Preliminary result)   Collection Time: 02/16/24  5:26 PM   Specimen: Path fluid; Body Fluid  Result Value Ref Range Status   Fungus Stain Final report  Final    Comment: (NOTE) Performed At: [facility] [address] [place], [place] [id number] Jolene Schimke MD XB:[id number]    Fungus (Mycology) Culture PENDING  Incomplete   Fungal Source TISSUE  Final    Comment: RIGHT MAXILLARY SINUS Performed at [facility], [address] [place], [place] [address]   Aerobic/Anaerobic Culture w Gram Stain (surgical/deep wound)     Status: None (Preliminary result)   Collection Time: 02/16/24  5:26 PM   Specimen: Path fluid; Body Fluid  Result Value Ref Range Status   Specimen  Description TISSUE  Final   Special Requests RIGHT MAXILLARY SINUS  Final   Gram Stain   Final    NO WBC SEEN RARE GRAM POSITIVE COCCI IN PAIRS RARE GRAM POSITIVE COCCI IN CHAINS RARE GRAM POSITIVE RODS CRITICAL RESULT CALLED TO, READ BACK BY AND VERIFIED WITH: MD STEVEN HOSHAL ON 02/16/24 @ 1859 BY DRT Performed at [facility], [address] [place], [place] [address]    Culture  Final    FEW EIKENELLA CORRODENS Usually susceptible to penicillin and other beta lactam agents,quinolones,macrolides and tetracyclines. FEW STREPTOCOCCUS ANGINOSIS REPEATING SUSCEPTIBILITIES NO ANAEROBES ISOLATED; CULTURE IN PROGRESS FOR 5 DAYS    Report Status PENDING  Incomplete  Fungus Culture With Stain     Status: None (Preliminary result)   Collection Time: 02/16/24  5:26 PM   Specimen: PATH ENT excision; Tissue  Result Value Ref Range Status   Fungus Stain Final report  Final    Comment: (NOTE) Performed At: [facility] [address] [place], [place] [id number] Jolene Schimke MD YQ:[id number]    Fungus (Mycology) Culture PENDING  Incomplete   Fungal Source SWAB OF RT MAXILLARY  SINUS  Final    Comment: Performed at [facility], [address], [place], [place] [address]  Aerobic/Anaerobic Culture w Gram Stain (surgical/deep wound)     Status: None (Preliminary result)   Collection Time: 02/16/24  5:26 PM   Specimen: PATH ENT excision; Tissue  Result Value Ref Range Status   Specimen Description WOUND  Final   Special Requests SWAB OF MAXILLARY SINUS SAMPLE A  Final   Gram Stain   Final    FEW WBC PRESENT, PREDOMINANTLY PMN FEW GRAM POSITIVE COCCI FEW GRAM NEGATIVE RODS RARE GRAM POSITIVE RODS    Culture   Final    FEW EIKENELLA CORRODENS Usually susceptible to penicillin and other beta lactam agents,quinolones,macrolides and tetracyclines. RARE STREPTOCOCCUS ANGINOSIS CULTURE REINCUBATED FOR BETTER GROWTH Performed at [facility], [address],  [place], [place] [address]    Report Status PENDING  Incomplete  Fungus Culture Result     Status: None   Collection Time: 02/16/24  5:26 PM  Result Value Ref Range Status   Result 1 Comment  Final    Comment: (NOTE) KOH/Calcofluor preparation:  no fungus observed. Performed At: [facility] [address] [place], [place] [id number] Jolene Schimke MD YQ:[id number]   Fungus Culture Result     Status: None   Collection Time: 02/16/24  5:26 PM  Result Value Ref Range Status   Result 1 Comment  Final    Comment: (NOTE) KOH/Calcofluor preparation:  no fungus observed. Performed At: [facility] [address] [place], [place] [id number] Jolene Schimke MD RJ:[id number]     Rexene Alberts, MSN, NP-C [facility]  [email or web address] Pager: [phone] Office: [phone] [facility] Main Line: [phone] *Secure Chat Communication Welcome    ----------- I have seen the patient and reviewed and agreed with the history/physical exam finding/labs with our APP. I agreed with the assessment/plan unless otherwise noted   78 yo male mm, chronic sinusitis sx/pooor dentition admitted found tohave chronic om as mentioned   Cx in progress with preliminary strep milleri, actinomyces species, and eikenella and ?yeast on path   Patient wants to go home   Exam Afebrile Well appearing Conversant No rash   Labs Reviewed   A/p Chronic om from sinusitis Polymicrobial including actinomyces species and ?yeast on path   -ok to discharge with empiric regimen -f/u outpatient arranged -actinom should be ok with ertapenem; will review prolonged penicillin course after initial 6 weeks iv abx

## 2024-02-21 NOTE — TOC Transition Note (Addendum)
 Transition of Care [facility]) - Discharge Note   Patient Details  Name: Johnny Lucas MRN: [id number] Date of Birth: January 11, 1946  [facility] CM/SW Contact:  Janae Bridgeman, RN Phone Number: 02/21/2024, 12:49 PM   Clinical Narrative:    CM spoke with Jeri Modena, RNCM with [employer] and she plans to have [facility] follow up with the patient in the home tomorrow since teaching was provided by the son by phone and wife was unable to come to visit for teaching on Friday.  Patient will go home with son once In Medications has been administered today.  Dr. Ernestene Kiel was notified and plans to place discharge orders and discharge summary.  Bedside nursing is aware and they plan to leave the patient's left chest port-a-cath accessed when he is discharge home today.  [facility] plans to follow up with the patient at the home tomorrow for start of care.  I spoke with Jeri Modena, RNCM with [employer] and they plan to deliver IV medications to the home tonight for the patient to administer meds tomorrow.         Patient Goals and CMS Choice            Discharge Placement                       Discharge Plan and Services Additional resources added to the After Visit Summary for                                       Social Drivers of Health (SDOH) Interventions SDOH Screenings   Food Insecurity: No Food Insecurity (02/17/2024)  Housing: Low Risk  (02/17/2024)  Transportation Needs: No Transportation Needs (02/17/2024)  Utilities: Not At Risk (02/17/2024)  Alcohol Screen: Low Risk  (11/26/2020)  Depression (PHQ2-9): Low Risk  (11/27/2020)  Financial Resource Strain: Low Risk  (11/26/2020)  Physical Activity: Inactive (11/26/2020)  Social Connections: Moderately Integrated (02/17/2024)  Stress: Stress Concern Present (11/26/2020)  Tobacco Use: Medium Risk (02/16/2024)     Readmission Risk Interventions    02/17/2024    3:22 PM  Readmission Risk  Prevention Plan  Post Dischage Appt Complete  Medication Screening Complete  Transportation Screening Complete

## 2024-02-21 NOTE — Progress Notes (Signed)
 VAST consult. Secure chat sent to nurses on unit. Requested MD order/Nursing Care Order to be placed in order to leave PORT accessed for home discharge, Synetta Shadow RN responded. Awaiting MD order. Tomasita Morrow, RN VAST

## 2024-02-21 NOTE — Progress Notes (Addendum)
 IVT consulted to 'cap & flush" pt's PAC for home. Currently still has antibiotic infusing- will return once complete.

## 2024-02-22 DIAGNOSIS — I10 Essential (primary) hypertension: Secondary | ICD-10-CM | POA: Diagnosis not present

## 2024-02-22 DIAGNOSIS — H3530 Unspecified macular degeneration: Secondary | ICD-10-CM | POA: Diagnosis not present

## 2024-02-22 DIAGNOSIS — Z48813 Encounter for surgical aftercare following surgery on the respiratory system: Secondary | ICD-10-CM | POA: Diagnosis not present

## 2024-02-22 DIAGNOSIS — Z792 Long term (current) use of antibiotics: Secondary | ICD-10-CM | POA: Diagnosis not present

## 2024-02-22 DIAGNOSIS — Z853 Personal history of malignant neoplasm of breast: Secondary | ICD-10-CM | POA: Diagnosis not present

## 2024-02-22 DIAGNOSIS — Z8701 Personal history of pneumonia (recurrent): Secondary | ICD-10-CM | POA: Diagnosis not present

## 2024-02-22 DIAGNOSIS — Z9181 History of falling: Secondary | ICD-10-CM | POA: Diagnosis not present

## 2024-02-22 DIAGNOSIS — Z7982 Long term (current) use of aspirin: Secondary | ICD-10-CM | POA: Diagnosis not present

## 2024-02-22 DIAGNOSIS — G629 Polyneuropathy, unspecified: Secondary | ICD-10-CM | POA: Diagnosis not present

## 2024-02-22 DIAGNOSIS — Z452 Encounter for adjustment and management of vascular access device: Secondary | ICD-10-CM | POA: Diagnosis not present

## 2024-02-22 DIAGNOSIS — C9000 Multiple myeloma not having achieved remission: Secondary | ICD-10-CM | POA: Diagnosis not present

## 2024-02-22 DIAGNOSIS — Z85830 Personal history of malignant neoplasm of bone: Secondary | ICD-10-CM | POA: Diagnosis not present

## 2024-02-22 DIAGNOSIS — K219 Gastro-esophageal reflux disease without esophagitis: Secondary | ICD-10-CM | POA: Diagnosis not present

## 2024-02-22 DIAGNOSIS — F419 Anxiety disorder, unspecified: Secondary | ICD-10-CM | POA: Diagnosis not present

## 2024-02-22 DIAGNOSIS — Z87891 Personal history of nicotine dependence: Secondary | ICD-10-CM | POA: Diagnosis not present

## 2024-02-22 LAB — AEROBIC/ANAEROBIC CULTURE W GRAM STAIN (SURGICAL/DEEP WOUND)

## 2024-02-22 LAB — FUNGITELL BETA-D-GLUCAN
Fungitell Value:: <31.25 pg/mL
Result Name:: NEGATIVE

## 2024-02-23 DIAGNOSIS — C9000 Multiple myeloma not having achieved remission: Secondary | ICD-10-CM | POA: Diagnosis not present

## 2024-02-23 DIAGNOSIS — G629 Polyneuropathy, unspecified: Secondary | ICD-10-CM | POA: Diagnosis not present

## 2024-02-23 DIAGNOSIS — Z48813 Encounter for surgical aftercare following surgery on the respiratory system: Secondary | ICD-10-CM | POA: Diagnosis not present

## 2024-02-23 DIAGNOSIS — I10 Essential (primary) hypertension: Secondary | ICD-10-CM | POA: Diagnosis not present

## 2024-02-23 DIAGNOSIS — F419 Anxiety disorder, unspecified: Secondary | ICD-10-CM | POA: Diagnosis not present

## 2024-02-23 DIAGNOSIS — K219 Gastro-esophageal reflux disease without esophagitis: Secondary | ICD-10-CM | POA: Diagnosis not present

## 2024-02-23 LAB — SURGICAL PATHOLOGY

## 2024-02-24 DIAGNOSIS — R200 Anesthesia of skin: Secondary | ICD-10-CM | POA: Diagnosis not present

## 2024-02-24 DIAGNOSIS — M272 Inflammatory conditions of jaws: Secondary | ICD-10-CM | POA: Diagnosis not present

## 2024-02-24 DIAGNOSIS — J320 Chronic maxillary sinusitis: Secondary | ICD-10-CM | POA: Diagnosis not present

## 2024-02-28 DIAGNOSIS — Z792 Long term (current) use of antibiotics: Secondary | ICD-10-CM | POA: Diagnosis not present

## 2024-02-28 DIAGNOSIS — K219 Gastro-esophageal reflux disease without esophagitis: Secondary | ICD-10-CM | POA: Diagnosis not present

## 2024-02-28 DIAGNOSIS — F419 Anxiety disorder, unspecified: Secondary | ICD-10-CM | POA: Diagnosis not present

## 2024-02-28 DIAGNOSIS — Z452 Encounter for adjustment and management of vascular access device: Secondary | ICD-10-CM | POA: Diagnosis not present

## 2024-02-28 DIAGNOSIS — Z48813 Encounter for surgical aftercare following surgery on the respiratory system: Secondary | ICD-10-CM | POA: Diagnosis not present

## 2024-02-28 DIAGNOSIS — C9000 Multiple myeloma not having achieved remission: Secondary | ICD-10-CM | POA: Diagnosis not present

## 2024-02-28 DIAGNOSIS — G629 Polyneuropathy, unspecified: Secondary | ICD-10-CM | POA: Diagnosis not present

## 2024-02-28 DIAGNOSIS — J320 Chronic maxillary sinusitis: Secondary | ICD-10-CM | POA: Diagnosis not present

## 2024-02-28 DIAGNOSIS — I10 Essential (primary) hypertension: Secondary | ICD-10-CM | POA: Diagnosis not present

## 2024-03-01 DIAGNOSIS — K219 Gastro-esophageal reflux disease without esophagitis: Secondary | ICD-10-CM | POA: Diagnosis not present

## 2024-03-01 DIAGNOSIS — Z48813 Encounter for surgical aftercare following surgery on the respiratory system: Secondary | ICD-10-CM | POA: Diagnosis not present

## 2024-03-01 DIAGNOSIS — I10 Essential (primary) hypertension: Secondary | ICD-10-CM | POA: Diagnosis not present

## 2024-03-01 DIAGNOSIS — C9000 Multiple myeloma not having achieved remission: Secondary | ICD-10-CM | POA: Diagnosis not present

## 2024-03-01 DIAGNOSIS — G629 Polyneuropathy, unspecified: Secondary | ICD-10-CM | POA: Diagnosis not present

## 2024-03-01 DIAGNOSIS — F419 Anxiety disorder, unspecified: Secondary | ICD-10-CM | POA: Diagnosis not present

## 2024-03-01 NOTE — Progress Notes (Incomplete)
 [facility] [address] [place] [address]    Clinic Day:  03/01/2024  Referring physician: Doreatha Massed, MD  Patient Care Team: Doreatha Massed, MD as PCP - General (Hematology) Doreatha Massed, MD as Consulting Physician (Oncology)   ASSESSMENT & PLAN:   Assessment: 1.  Relapsed multiple myeloma: -Autologous stem cell transplant on 10/08/2017 -Post transplant consolidation with CyCarD for 2 cycles with M spike undetectable. -Maintenance therapy with carfilzomib 70 mg per metered square days 1 and 15 every 28 days from 04/20/2018, dose reduced to 35 mg per metered square on 01/05/2019, titrated up to 56 mg per metered square on 01/09/2020. 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 -PET scan on 02/18/2020 showed new left supraclavicular lymph node at the thoracic inlet, SUV 14.2.  Multifocal osseous lesions largely improved, though residual lesions noted including right acromion, left acromion, thyroid cartilage, right sternum.  Additional lesions throughout the sternum, bilateral ribs, thoracolumbar spine, bilateral pelvis have resolved. -BMBX on 02/14/2020 shows plasma cell myeloma, 60-70% of cells.  FISH for myeloma was negative.  Cytogenetics was normal. -4 cycles of daratumumab, bortezomib and dexamethasone from 02/28/2020 through 04/29/2020. -Daratumumab, pomalidomide and dexamethasone started on 05/28/2020.  He is receiving daratumumab every 2 weeks, Pomalyst 3 mg days 1-21, dexamethasone 20 mg weekly. -Pomalidomide dose reduced to 2 mg days 1-21 around the first week of July 2021, due to severe weakness.    Plan: 1.  Relapsed IgG kappa multiple myeloma: - He is tolerating pomalidomide and Darzalex well.  He takes dexamethasone on days of Darzalex injection. - Reviewed myeloma labs from 01/28/2024: M spike is stable at 0.1 g.  Free light chain ratio is normal at 1.05.  Immunofixation shows IgG kappa. -  Continue pomalidomide 2 mg 3 weeks on/1 week off.  He may proceed with Darzalex today. - He reported numbness/tingling in the right maxillary sinus area worse in the last 1 week.  He had this numbness on and off since he had sinus surgery in January 2024.  He reports occasional pain in the right maxillary sinus area.  He also reported worsening of frontal headaches. - Will order CT maxillofacial sinuses with contrast.  He has follow-up with ENT on 02/14/2024. - RTC 4 weeks for follow-up.   2.  Myeloma bone disease: - Will hold denosumab today.   3.  Macrocytic anemia: - From CKD and functional iron deficiency and myelosuppression.  Hemoglobin is stable at 10.1.  Will give Retacrit if hemoglobin drops below 9.   4.  ID prophylaxis: - On acyclovir twice daily.   5.  Peripheral neuropathy: - Continue gabapentin 600 mg 3 times daily.   6.  History of breast cancer: - Continue tamoxifen daily.  7.  Hypogammaglobulinemia: - IgG levels are 196.  Will consider IVIG if recurrent infections.    No orders of the defined types were placed in this encounter.    Alben Deeds Teague,acting as a Neurosurgeon for Doreatha Massed, MD.,have documented all relevant documentation on the behalf of Doreatha Massed, MD,as directed by  Doreatha Massed, MD while in the presence of Doreatha Massed, MD.  ***    [place]   3/5/20253:26 PM  CHIEF COMPLAINT:   Diagnosis: multiple myeloma    Cancer Staging  Multiple myeloma ([name] Staging form: Plasma Cell Myeloma and Plasma Cell Disorders, AJCC 8th Edition - Clinical stage from 05/03/2017: Beta-2-microglobulin (mg/L): 3.5, Albumin (g/dL): 3.4, ISS: Stage II, High-risk cytogenetics: Absent, LDH:  Not assessed - Signed by Ellouise Newer, PA-C on 05/03/2017    Prior Therapy: 1. RVD x 6 cycles from 03/01/2017 to 06/29/2017. 2. Stem cell transplant on 09/30/2017. 3. Carfilzomib and cyclophosphamide x 2 cycles from 02/14/2018 to  03/29/2018. 4. Carfilzomib x 22 cycles from 04/20/2018 to 01/23/2020.  Current Therapy:  Darzalex Faspro monthly; Pomalyst 2 mg 3/4 weeks; Xgeva monthly    HISTORY OF PRESENT ILLNESS:   Oncology History  Breast cancer, male ([name]  12/31/2009 Initial Biopsy   Biopsy of L breast    12/31/2009 Pathology Results   Invasive ductal carcinoma, ER/PR+, HER 2 negative   12/31/2009 Imaging   Ultrasound showing a 2.43 x 1.85 x 3 cm hypoechoic spiculated mass in the 12 o clock L breast retroareolar region   01/01/2010 -  Anti-estrogen oral therapy   Tamoxifen 20 mg daily   01/06/2010 Imaging   Bone scan abnormal uptake in the diaphysis of the R humerus, abnormal in the R third, fifth and sixth ribs, lesion also noted in the sternum.   02/03/2010 Surgery   Rod placement and fixation of R humerus by Dr. Amanda Pea   02/05/2010 - 02/18/2010 Radiation Therapy   30Gy in 10 fractions of 3 Gy per fraction to R pathologic fracture   03/11/2010 -  Chemotherapy   Denosumab monthly, now every 3 months. Started at [name]    06/09/2016 Imaging   Three hypermetabolic osseous lesions in the sternum, left ilium and right ilium, as discussed above, likely represent osseous metastases. At this time, these are not recognizable on the CT images. 2. No extra skeletal metastatic disease identified in the neck, chest, abdomen or pelvis.   10/13/2016 Progression   PET shows various new and enlarging osseous metastatic lesions with no definite extra osseous metastatic disease currently identified.    12/31/2016 Progression   1. Multifocal hypermetabolic osseous metastases throughout the axial and proximal appendicular skeleton, which are increased in size, number and metabolism since 10/13/2016 PET-CT. 2. 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 3. No additional sites of hypermetabolic metastatic  disease. 4. Chronic right mastoid sinusitis. 5. Aortic atherosclerosis.  One vessel coronary atherosclerosis.   Multiple myeloma ([name]  02/12/2017 Bone Marrow Biopsy   The marrow was variably cellular with large peritrabecular aggregates of kappa restricted plasma cells (66% by aspirate, 30% by Cd138). Cytogenetics +11.    03/01/2017 - 06/29/2017 Chemotherapy   RVD    05/26/2017 Bone Marrow Biopsy   Performed at [facility]:  Plasma cell myeloma in a 30% cellular marrow with decreased trilineage hematopoiesis and 42% kappa light chain restricted plasma cells on the aspirate smears and large aggregates on the core biopsy.     07/12/2017 - 09/01/2017 Chemotherapy   3 cycles of carfizolmib/cyclophosphamide/dexamethasone     10/08/2017 Bone Marrow Transplant   Autotransplant at [facility]   02/28/2020 - 09/01/2022 Chemotherapy   Patient is on Treatment Plan : Daratumumab q28d     09/01/2022 -  Chemotherapy   Patient is on Treatment Plan : MYELOMA Daratumumab Faspro Subq q28d     Multiple myeloma not having achieved remission ([name]  06/29/2017 Initial Diagnosis   Multiple myeloma not having achieved remission ([name]   02/28/2020 - 09/01/2022 Chemotherapy   Patient is on Treatment Plan : Daratumumab q28d     09/01/2022 -  Chemotherapy   Patient is on Treatment Plan : MYELOMA  Daratumumab Faspro Subq q28d        INTERVAL HISTORY:   Johnny Lucas is a 78 y.o. male presenting to clinic today for follow up of multiple myeloma. He was last seen by me on 02/03/24.  Since his last visit, he underwent right endoscopic sinus surgery on 02/16/24. He had CT maxillofacial on 02/10/24 that found: Soft tissue thickening and edema at the right nasolabial fold, consistent with cellulitis. No abscess or drainable fluid collection.Inflammatory change of the inferior and medial extraconal fat of the right orbit, likely orbital cellulitis. Periapical lucency at the root site of the absent tooth 4, which may  be a source of the above described infectious/inflammatory processes. Chronic right-sided sinusitis.   Today, he states that he is doing well overall. His appetite level is at ***%. His energy level is at ***%.   PAST MEDICAL HISTORY:   Past Medical History: Past Medical History:  Diagnosis Date   Anxiety    Bone metastases 09/10/2016   Breast cancer, male [facility]) 2011   Stage IV breast cancer; radiation and tamoxifen  Overview:  Left breast ca with mets bone  Overview:  METS TO BONE   Bronchitis    uses inhaler   Cough    uses inhaler   GERD (gastroesophageal reflux disease)    Headache    History of blood transfusion    Hypertension    Macular degeneration    bilateral   Memory loss    mild   Multiple myeloma ([name] 02/18/2017   Peripheral neuropathy    feet   Pneumonia    mild case 1-2 yrs ago (2023 or 2024)    Surgical History: Past Surgical History:  Procedure Laterality Date   BACK SURGERY     C2-C3 fusion   BREAST BIOPSY Left    cancer   CATARACT EXTRACTION W/PHACO Left 02/03/2019   Procedure: CATARACT EXTRACTION PHACO AND INTRAOCULAR LENS PLACEMENT (IOC);  Surgeon: Fabio Pierce, MD;  Location: [facility];  Service: Ophthalmology;  Laterality: Left;  CDE: 15.89   CATARACT EXTRACTION W/PHACO Right 07/25/2021   Procedure: CATARACT EXTRACTION PHACO AND INTRAOCULAR LENS PLACEMENT (IOC);  Surgeon: Fabio Pierce, MD;  Location: [facility];  Service: Ophthalmology;  Laterality: Right;  CDE   26.71   COLONOSCOPY     HERNIA REPAIR     MAXILLARY ANTROSTOMY Right 02/16/2024   Procedure: REVISION MAXILLARY ANTROSTOMY WITH TISSUE REMOVAL;  Surgeon: [name], MD;  Location: [facility] OR;  Service: ENT;  Laterality: Right;   NASAL SINUS SURGERY Right 02/16/2024   Procedure: RIGHT ENDOSCOPIC SINUS SURGERY;  Surgeon: [name], MD;  Location: [facility] OR;  Service: ENT;  Laterality: Right;   NASAL TURBINATE REDUCTION Right 02/16/2024   Procedure: PARTIAL MAXILLECTOMY AND DENTAL  EXTRACTIONS;  Surgeon: [name], MD;  Location: [facility] OR;  Service: ENT;  Laterality: Right;   PORTACATH PLACEMENT Left 06/10/2018   Procedure: INSERTION PORT-A-CATH;  Surgeon: Lucretia Roers, MD;  Location: [facility];  Service: General;  Laterality: Left;   right arm surgery     bone cancer and rod placed   SINUS ENDO WITH FUSION Bilateral 03/25/2023   Procedure: FUNCTIONAL ENDOSCOPIC SINUS SURGERY WITH FUSION AND NAVIGATION;  Surgeon: [name], MD;  Location: [facility] OR;  Service: ENT;  Laterality: Bilateral;    Social History: Social History   Socioeconomic History   Marital status: Married    Spouse name: Not on file   Number of children: 3   Years of education: Not on file  Highest education level: Not on file  Occupational History   Not on file  Tobacco Use   Smoking status: Never   Smokeless tobacco: Former    Types: Chew  Vaping Use   Vaping status: Never Used  Substance and Sexual Activity   Alcohol use: Yes    Alcohol/week: 24.0 standard drinks of alcohol    Types: 24 Cans of beer per week    Comment: 3-4 beers a week   Drug use: No   Sexual activity: Yes  Other Topics Concern   Not on file  Social History Narrative   Not on file   Social Drivers of Health   Financial Resource Strain: Low Risk  (11/26/2020)   Overall Financial Resource Strain (CARDIA)    Difficulty of Paying Living Expenses: Not hard at all  Food Insecurity: No Food Insecurity (02/17/2024)   Hunger Vital Sign    Worried About Running Out of Food in the Last Year: Never true    Ran Out of Food in the Last Year: Never true  Transportation Needs: No Transportation Needs (02/17/2024)   PRAPARE - Administrator, Civil Service (Medical): No    Lack of Transportation (Non-Medical): No  Physical Activity: Inactive (11/26/2020)   Exercise Vital Sign    Days of Exercise per Week: 0 days    Minutes of Exercise per Session: 0 min  Stress: Stress Concern Present (11/26/2020)    [employer] of Occupational Health - Occupational Stress Questionnaire    Feeling of Stress : To some extent  Social Connections: Moderately Integrated (02/17/2024)   Social Connection and Isolation Panel [NHANES]    Frequency of Communication with Friends and Family: More than three times a week    Frequency of Social Gatherings with Friends and Family: Three times a week    Attends Religious Services: 1 to 4 times per year    Active Member of Clubs or Organizations: No    Attends Banker Meetings: Never    Marital Status: Married  Catering manager Violence: Not At Risk (02/17/2024)   Humiliation, Afraid, Rape, and Kick questionnaire    Fear of Current or Ex-Partner: No    Emotionally Abused: No    Physically Abused: No    Sexually Abused: No    Family History: Family History  Problem Relation Age of Onset   Stroke Mother    Cancer Maternal Aunt        cancer NOS; died in her 13s   Lung cancer Maternal Uncle        smoker    Current Medications:  Current Outpatient Medications:    acetaminophen (TYLENOL) 500 MG tablet, Take 1,000 mg by mouth every 8 (eight) hours as needed for mild pain (pain score 1-3) or moderate pain (pain score 4-6)., Disp: , Rfl:    acyclovir (ZOVIRAX) 800 MG tablet, TAKE 1 TABLET TWICE DAILY, Disp: 180 tablet, Rfl: 3   albuterol (VENTOLIN HFA) 108 (90 Base) MCG/ACT inhaler, Inhale 2 puffs into the lungs every 6 (six) hours as needed for wheezing or shortness of breath., Disp: , Rfl:    ALPRAZolam (XANAX) 0.5 MG tablet, TAKE 1 TABLET BY MOUTH AT BEDTIME AS NEEDED FOR anxiety / SLEEP, Disp: 30 tablet, Rfl: 5   aspirin EC 81 MG tablet, Take 81 mg by mouth daily. , Disp: , Rfl:    cetirizine (ZYRTEC) 10 MG tablet, Take 10 mg by mouth daily., Disp: , Rfl:    chlorhexidine (PERIDEX) 0.12 %  solution, Use as directed 15 mLs in the mouth or throat 4 (four) times daily - after meals and at bedtime for 21 days., Disp: 473 mL, Rfl: 1   daptomycin  (CUBICIN) IVPB, Inject 700 mg into the vein daily. Indication:  Sinusitis/osteo First Dose: Yes Last Day of Therapy:  03/29/24 Labs - Once weekly:  CBC/D, BMP, and CPK Labs - Once weekly: ESR and CRP Method of administration: IV Push Method of administration may be changed at the discretion of home infusion pharmacist based upon assessment of the patient and/or caregiver's ability to self-administer the medication ordered., Disp: 37 Units, Rfl: 0   diphenhydrAMINE (BENADRYL) 12.5 MG/5ML elixir, Take 12.5 mg by mouth daily as needed for allergies., Disp: , Rfl:    ertapenem (INVANZ) IVPB, Inject 1 g into the vein daily. Indication:  Sinusitis/osteo First Dose: Yes Last Day of Therapy:  03/29/24 Labs - Once weekly:  CBC/D and BMP, Labs - Once weekly: ESR and CRP Method of administration: Mini-Bag Plus / Gravity Method of administration may be changed at the discretion of home infusion pharmacist based upon assessment of the patient and/or caregiver's ability to self-administer the medication ordered., Disp: 37 Units, Rfl: 0   folic acid (FOLVITE) 1 MG tablet, TAKE 1 TABLET BY MOUTH DAILY, Disp: 90 tablet, Rfl: 2   gabapentin (NEURONTIN) 300 MG capsule, Take 1 capsule (300 mg total) by mouth 3 (three) times daily., Disp: 180 capsule, Rfl: 2   Loperamide HCl (IMODIUM PO), Take 2 mg by mouth daily as needed (diarrhea)., Disp: , Rfl:    metoprolol succinate (TOPROL-XL) 100 MG 24 hr tablet, Take 100 mg by mouth in the morning., Disp: , Rfl:    micafungin (MYCAMINE) 50 MG injection, Inject 100 mg into the vein daily. Indication:  Sinusitis/osteo; First Dose: Yes; Last Day of Therapy:  03/29/24; Labs - Once weekly:  CBC/D and BMP, Labs - Once weekly: ESR and CRP; Method of administration: Elastomeric; Method of administration may be changed at the discretion of home infusion pharmacist based upon assessment of the patient and/or caregiver's ability to self-administer the medication ordered., Disp: 74 each, Rfl: 0    ondansetron (ZOFRAN) 8 MG tablet, TAKE 1 TABLET BY MOUTH EVERY 8 HOURS AS NEEDED FOR NAUSEA AND VOMITING (Patient not taking: Reported on 02/14/2024), Disp: 30 tablet, Rfl: 2   pantoprazole (PROTONIX) 40 MG tablet, TAKE 1 TABLET EVERY OTHER DAY, Disp: 45 tablet, Rfl: 3   polyethylene glycol (MIRALAX / GLYCOLAX) packet, Take 17 g by mouth daily as needed for mild constipation., Disp: 14 each, Rfl: 0   sodium chloride (OCEAN) 0.65 % SOLN nasal spray, Place 2 sprays into both nostrils 3 (three) times daily., Disp: 30 mL, Rfl: 1   tamoxifen (NOLVADEX) 20 MG tablet, TAKE 1 TABLET EVERY DAY, Disp: 90 tablet, Rfl: 3 No current facility-administered medications for this visit.  Facility-Administered Medications Ordered in Other Visits:    ondansetron (ZOFRAN) 8 mg in sodium chloride 0.9 % 50 mL IVPB, 8 mg, Intravenous, Once, Lockamy, Randi L, NP-C   sodium chloride flush (NS) 0.9 % injection 10 mL, 10 mL, Intracatheter, Once PRN, Doreatha Massed, MD   Allergies: Allergies  Allergen Reactions   Cefepime     Suspected severe thrombocytopenia is a result of cefepime induced antigen platelet destruction    REVIEW OF SYSTEMS:   Review of Systems  Constitutional:  Negative for chills, fatigue and fever.  HENT:   Negative for lump/mass, mouth sores, nosebleeds, sore throat and trouble  swallowing.   Eyes:  Negative for eye problems.  Respiratory:  Negative for cough and shortness of breath.   Cardiovascular:  Negative for chest pain, leg swelling and palpitations.  Gastrointestinal:  Negative for abdominal pain, constipation, diarrhea, nausea and vomiting.  Genitourinary:  Negative for bladder incontinence, difficulty urinating, dysuria, frequency, hematuria and nocturia.   Musculoskeletal:  Negative for arthralgias, back pain, flank pain, myalgias and neck pain.  Skin:  Negative for itching and rash.  Neurological:  Negative for dizziness, headaches and numbness.  Hematological:  Does not  bruise/bleed easily.  Psychiatric/Behavioral:  Negative for depression, sleep disturbance and suicidal ideas. The patient is not nervous/anxious.   All other systems reviewed and are negative.    VITALS:   There were no vitals taken for this visit.  Wt Readings from Last 3 Encounters:  02/16/24 194 lb 7.1 oz (88.2 kg)  02/03/24 193 lb 12.6 oz (87.9 kg)  01/06/24 198 lb 3.2 oz (89.9 kg)    There is no height or weight on file to calculate BMI.  Performance status (ECOG): 1 - Symptomatic but completely ambulatory  PHYSICAL EXAM:   Physical Exam Vitals and nursing note reviewed. Exam conducted with a chaperone present.  Constitutional:      Appearance: Normal appearance.  Cardiovascular:     Rate and Rhythm: Normal rate and regular rhythm.     Pulses: Normal pulses.     Heart sounds: Normal heart sounds.  Pulmonary:     Effort: Pulmonary effort is normal.     Breath sounds: Normal breath sounds.  Abdominal:     Palpations: Abdomen is soft. There is no hepatomegaly, splenomegaly or mass.     Tenderness: There is no abdominal tenderness.  Musculoskeletal:     Right lower leg: No edema.     Left lower leg: No edema.  Lymphadenopathy:     Cervical: No cervical adenopathy.     Right cervical: No superficial, deep or posterior cervical adenopathy.    Left cervical: No superficial, deep or posterior cervical adenopathy.     Upper Body:     Right upper body: No supraclavicular or axillary adenopathy.     Left upper body: No supraclavicular or axillary adenopathy.  Neurological:     General: No focal deficit present.     Mental Status: He is alert and oriented to person, place, and time.  Psychiatric:        Mood and Affect: Mood normal.        Behavior: Behavior normal.     LABS:      Latest Ref Rng & Units 02/21/2024   10:36 AM 01/28/2024   10:42 AM 01/06/2024    9:46 AM  CBC  WBC 4.0 - 10.5 K/uL 3.3  4.0  4.1   Hemoglobin 13.0 - 17.0 g/dL 8.4  16.1  09.6   Hematocrit  39.0 - 52.0 % 25.9  30.7  31.5   Platelets 150 - 400 K/uL 178  168  175       Latest Ref Rng & Units 02/17/2024    5:02 AM 01/28/2024   10:42 AM 01/06/2024    9:46 AM  CMP  Glucose 70 - 99 mg/dL 045  99  409   BUN 8 - 23 mg/dL 20  14  12    Creatinine 0.61 - 1.24 mg/dL 8.11  9.14  7.82   Sodium 135 - 145 mmol/L 136  137  135   Potassium 3.5 - 5.1 mmol/L 4.4  4.5  4.1   Chloride 98 - 111 mmol/L 107  105  106   CO2 22 - 32 mmol/L 20  23  23    Calcium 8.9 - 10.3 mg/dL 7.6  9.3  8.2   Total Protein 6.5 - 8.1 g/dL  5.5  5.3   Total Bilirubin 0.0 - 1.2 mg/dL  0.6  0.6   Alkaline Phos 38 - 126 U/L  34  30   AST 15 - 41 U/L  11  13   ALT 0 - 44 U/L  11  11      No results found for: "CEA1", "CEA" / No results found for: "CEA1", "CEA" No results found for: "PSA1" No results found for: "NWG956" No results found for: "CAN125"  Lab Results  Component Value Date   TOTALPROTELP 4.8 (L) 01/28/2024   TOTALPROTELP 4.9 (L) 01/28/2024   ALBUMINELP 3.0 01/28/2024   A1GS 0.3 01/28/2024   A2GS 0.7 01/28/2024   BETS 0.8 01/28/2024   GAMS 0.2 (L) 01/28/2024   MSPIKE 0.1 (H) 01/28/2024   SPEI Comment 01/28/2024   Lab Results  Component Value Date   TIBC 314 03/02/2023   TIBC 308 08/25/2022   TIBC 325 03/25/2020   FERRITIN 161 03/02/2023   FERRITIN 170 08/25/2022   FERRITIN 640 (H) 03/25/2020   IRONPCTSAT 11 (L) 03/02/2023   IRONPCTSAT 24 08/25/2022   IRONPCTSAT 10 (L) 03/25/2020   Lab Results  Component Value Date   LDH 131 11/18/2021   LDH 127 09/30/2021   LDH 102 08/05/2021     STUDIES:   MR ORBITS W WO CONTRAST Addendum Date: 02/17/2024 ADDENDUM REPORT: 02/17/2024 19:07 ADDENDUM: There is minimal inclusion of the mandible on this study. There is mild edema within the partially visualized head of the right mandibular condyle. Given that there is marked inflammatory change superficial and deep to the anterior wall of the right maxilla, there is likely osteomyelitis at this  location, but there are no specific bone marrow changes visible on this study. Electronically Signed   By: Deatra Robinson M.D.   On: 02/17/2024 19:07   Result Date: 02/17/2024 CLINICAL DATA:  Invasive fungal sinusitis EXAM: MRI OF THE ORBITS WITHOUT AND WITH CONTRAST TECHNIQUE: Multiplanar, multi-echo pulse sequences of the orbits and surrounding structures were acquired including fat saturation techniques, before and after intravenous contrast administration. CONTRAST:  9mL GADAVIST GADOBUTROL 1 MMOL/ML IV SOLN COMPARISON:  02/10/2024 maxillofacial CT FINDINGS: Orbits: There is abnormal contrast enhancement of the extraconal fat of the inferior and medial right orbit. No abnormal enhancement of the extraocular muscles or optic nerve. The globe is normal. Left orbit is normal. Visualized sinuses: Diffuse right-sided paranasal sinus disease Soft tissues: Incompletely visualized abnormal contrast enhancement of the right temporalis and pterygoid muscles. Inflammatory change of the right pre maxillary fat. Limited intracranial: Negative IMPRESSION: 1. Abnormal contrast enhancement of the extraconal fat of the inferior and medial right orbit, consistent with orbital cellulitis. 2. Diffuse right-sided paranasal sinus disease with abnormal contrast enhancement of the right masticator space and pterygoid muscles, consistent with myositis. Electronically Signed: By: Deatra Robinson M.D. On: 02/16/2024 22:20   CT MAXILLOFACIAL W & WO CONTRAST Result Date: 02/15/2024 CLINICAL DATA:  Right facial swelling EXAM: CT MAXILLOFACIAL WITHOUT AND WITH CONTRAST TECHNIQUE: Multidetector CT imaging of the maxillofacial structures was performed without and with intravenous contrast. Multiplanar CT image reconstructions were also generated. RADIATION DOSE REDUCTION: This exam was performed according to the departmental dose-optimization program which includes automated exposure control, adjustment  of the mA and/or kV according to  patient size and/or use of iterative reconstruction technique. CONTRAST:  75mL OMNIPAQUE IOHEXOL 350 MG/ML SOLN COMPARISON:  None Available. FINDINGS: Osseous: Periapical lucency at the root site of the absent tooth 4. No fracture or aggressive osseous lesion. Chronic mucoperiosteal remodeling of the right maxillary, frontal and ethmoid sinuses. Orbits: There is inflammatory change of the inferior and medial extraconal fat of the right orbit Sinuses: Complete opacification of the right maxillary sinus. Moderate mucosal thickening of the right frontal, ethmoid and sphenoid sinuses. Soft tissues: Soft tissue thickening and edema at the right nasolabial fold. No abscess or drainable fluid collection. Limited intracranial: No significant or unexpected finding. IMPRESSION: 1. Soft tissue thickening and edema at the right nasolabial fold, consistent with cellulitis. No abscess or drainable fluid collection. 2. Inflammatory change of the inferior and medial extraconal fat of the right orbit, likely orbital cellulitis. 3. Periapical lucency at the root site of the absent tooth 4, which may be a source of the above described infectious/inflammatory processes. 4. Chronic right-sided sinusitis. Electronically Signed   By: Deatra Robinson M.D.   On: 02/15/2024 00:55

## 2024-03-02 ENCOUNTER — Inpatient Hospital Stay: Payer: Medicare Other | Admitting: Hematology

## 2024-03-02 ENCOUNTER — Inpatient Hospital Stay

## 2024-03-02 ENCOUNTER — Inpatient Hospital Stay (HOSPITAL_BASED_OUTPATIENT_CLINIC_OR_DEPARTMENT_OTHER): Admitting: Hematology

## 2024-03-02 ENCOUNTER — Inpatient Hospital Stay: Payer: Medicare Other | Attending: Hematology

## 2024-03-02 DIAGNOSIS — K219 Gastro-esophageal reflux disease without esophagitis: Secondary | ICD-10-CM | POA: Insufficient documentation

## 2024-03-02 DIAGNOSIS — C9000 Multiple myeloma not having achieved remission: Secondary | ICD-10-CM | POA: Diagnosis not present

## 2024-03-02 DIAGNOSIS — C9002 Multiple myeloma in relapse: Secondary | ICD-10-CM | POA: Insufficient documentation

## 2024-03-02 DIAGNOSIS — N179 Acute kidney failure, unspecified: Secondary | ICD-10-CM

## 2024-03-02 DIAGNOSIS — G629 Polyneuropathy, unspecified: Secondary | ICD-10-CM | POA: Insufficient documentation

## 2024-03-02 DIAGNOSIS — N184 Chronic kidney disease, stage 4 (severe): Secondary | ICD-10-CM | POA: Insufficient documentation

## 2024-03-02 DIAGNOSIS — Z79899 Other long term (current) drug therapy: Secondary | ICD-10-CM | POA: Insufficient documentation

## 2024-03-02 DIAGNOSIS — Z79624 Long term (current) use of inhibitors of nucleotide synthesis: Secondary | ICD-10-CM | POA: Insufficient documentation

## 2024-03-02 DIAGNOSIS — E611 Iron deficiency: Secondary | ICD-10-CM | POA: Insufficient documentation

## 2024-03-02 DIAGNOSIS — D631 Anemia in chronic kidney disease: Secondary | ICD-10-CM | POA: Insufficient documentation

## 2024-03-02 DIAGNOSIS — Z853 Personal history of malignant neoplasm of breast: Secondary | ICD-10-CM | POA: Insufficient documentation

## 2024-03-02 DIAGNOSIS — C7951 Secondary malignant neoplasm of bone: Secondary | ICD-10-CM

## 2024-03-02 DIAGNOSIS — Z7982 Long term (current) use of aspirin: Secondary | ICD-10-CM | POA: Diagnosis not present

## 2024-03-02 DIAGNOSIS — Z923 Personal history of irradiation: Secondary | ICD-10-CM | POA: Diagnosis not present

## 2024-03-02 DIAGNOSIS — D801 Nonfamilial hypogammaglobulinemia: Secondary | ICD-10-CM | POA: Diagnosis not present

## 2024-03-02 DIAGNOSIS — I129 Hypertensive chronic kidney disease with stage 1 through stage 4 chronic kidney disease, or unspecified chronic kidney disease: Secondary | ICD-10-CM | POA: Insufficient documentation

## 2024-03-02 LAB — COMPREHENSIVE METABOLIC PANEL
ALT: 10 U/L (ref 0–44)
AST: 16 U/L (ref 15–41)
Albumin: 3.1 g/dL — ABNORMAL LOW (ref 3.5–5.0)
Alkaline Phosphatase: 35 U/L — ABNORMAL LOW (ref 38–126)
Anion gap: 7 (ref 5–15)
BUN: 11 mg/dL (ref 8–23)
CO2: 24 mmol/L (ref 22–32)
Calcium: 8.3 mg/dL — ABNORMAL LOW (ref 8.9–10.3)
Chloride: 108 mmol/L (ref 98–111)
Creatinine, Ser: 1.18 mg/dL (ref 0.61–1.24)
GFR, Estimated: >60 mL/min (ref >60)
Glucose, Bld: 97 mg/dL (ref 70–99)
Potassium: 4.4 mmol/L (ref 3.5–5.1)
Sodium: 139 mmol/L (ref 135–145)
Total Bilirubin: 0.5 mg/dL (ref 0.0–1.2)
Total Protein: 5.4 g/dL — ABNORMAL LOW (ref 6.5–8.1)

## 2024-03-02 LAB — CBC WITH DIFFERENTIAL/PLATELET
Abs Immature Granulocytes: 0.01 10*3/uL (ref 0.00–0.07)
Basophils Absolute: 0.1 10*3/uL (ref 0.0–0.1)
Basophils Relative: 2 %
Eosinophils Absolute: 0.4 10*3/uL (ref 0.0–0.5)
Eosinophils Relative: 7 %
HCT: 28.4 % — ABNORMAL LOW (ref 39.0–52.0)
Hemoglobin: 8.9 g/dL — ABNORMAL LOW (ref 13.0–17.0)
Immature Granulocytes: 0 %
Lymphocytes Relative: 21 %
Lymphs Abs: 1 10*3/uL (ref 0.7–4.0)
MCH: 33.1 pg (ref 26.0–34.0)
MCHC: 31.3 g/dL (ref 30.0–36.0)
MCV: 105.6 fL — ABNORMAL HIGH (ref 80.0–100.0)
Monocytes Absolute: 0.3 10*3/uL (ref 0.1–1.0)
Monocytes Relative: 7 %
Neutro Abs: 3.1 10*3/uL (ref 1.7–7.7)
Neutrophils Relative %: 63 %
Platelets: 232 10*3/uL (ref 150–400)
RBC: 2.69 MIL/uL — ABNORMAL LOW (ref 4.22–5.81)
RDW: 16.1 % — ABNORMAL HIGH (ref 11.5–15.5)
WBC: 4.9 10*3/uL (ref 4.0–10.5)
nRBC: 0 % (ref 0.0–0.2)

## 2024-03-02 LAB — MAGNESIUM: Magnesium: 2.1 mg/dL (ref 1.7–2.4)

## 2024-03-02 MED ORDER — HEPARIN SOD (PORK) LOCK FLUSH 100 UNIT/ML IV SOLN
500.0000 [IU] | Freq: Once | INTRAVENOUS | Status: AC
  Administered 2024-03-02: 500 [IU] via INTRAVENOUS

## 2024-03-02 MED ORDER — EPOETIN ALFA-EPBX 10000 UNIT/ML IJ SOLN
20000.0000 [IU] | Freq: Once | INTRAMUSCULAR | Status: AC
  Administered 2024-03-02: 20000 [IU] via SUBCUTANEOUS
  Filled 2024-03-02: qty 2

## 2024-03-02 MED ORDER — SODIUM CHLORIDE 0.9% FLUSH
10.0000 mL | Freq: Once | INTRAVENOUS | Status: AC
  Administered 2024-03-02: 10 mL via INTRAVENOUS

## 2024-03-02 NOTE — Progress Notes (Signed)
 Patient's Hgb 8.9 and blood pressure stable. patient tolerated Retacrit injection with no complaints voiced.  Site clean and dry with no bruising or swelling noted at site.  See MAR for details.  Band aid applied.  Patient stable during and after injection.  Vss with discharge and left in satisfactory condition with no s/s of distress noted. All follow ups as scheduled.   [employer]

## 2024-03-02 NOTE — Progress Notes (Signed)
 Patients port flushed without difficulty.  Good blood return noted with no bruising or swelling noted at site.  Labs drawn per orders. Per pt he has a home health nurse that comes out to his house to take of his port at least once a week. Port needle was placed by home health nurse per pt and port dressing looked clean, dry, and intact. VSS with discharge and left in satisfactory condition with no s/s of distress noted. All follow ups as scheduled.   Robson Trickey [employer]

## 2024-03-02 NOTE — Progress Notes (Signed)
Patient is taking Pomalyst as prescribed.  He has not missed any doses and reports no side effects at this time.   

## 2024-03-02 NOTE — Patient Instructions (Addendum)
 [facility] at [facility] Discharge Instructions   You were seen and examined today by Dr. Ellin Saba.  He reviewed the results of your lab work which are normal/stable.   STOP Pomalyst. We will hold myeloma treatment (dexamethasone and Darzalex).   We will hold Xgeva as well.   Return as scheduled.    Thank you for choosing [facility] at [facility] to provide your oncology and hematology care.  To afford each patient quality time with our provider, please arrive at least 15 minutes before your scheduled appointment time.   If you have a lab appointment with the Cancer Center please come in thru the Main Entrance and check in at the main information desk.  You need to re-schedule your appointment should you arrive 10 or more minutes late.  We strive to give you quality time with our providers, and arriving late affects you and other patients whose appointments are after yours.  Also, if you no show three or more times for appointments you may be dismissed from the clinic at the providers discretion.     Again, thank you for choosing [facility].  Our hope is that these requests will decrease the amount of time that you wait before being seen by our physicians.       _____________________________________________________________  Should you have questions after your visit to [name], please contact our office at [phone] and follow the prompts.  Our office hours are 8:00 a.m. and 4:30 p.m. Monday - Friday.  Please note that voicemails left after 4:00 p.m. may not be returned until the following business day.  We are closed weekends and major holidays.  You do have access to a nurse 24-7, just call the main number to the clinic [phone] and do not press any options, hold on the line and a nurse will answer the phone.    For prescription refill requests, have your pharmacy contact our office and allow 72  hours.    Due to Covid, you will need to wear a mask upon entering the hospital. If you do not have a mask, a mask will be given to you at the Main Entrance upon arrival. For doctor visits, patients may have 1 support person age 76 or older with them. For treatment visits, patients can not have anyone with them due to social distancing guidelines and our immunocompromised population.

## 2024-03-02 NOTE — Progress Notes (Signed)
 [facility] [address] [place] [address]    Clinic Day:  03/02/2024  Referring physician: Doreatha Massed, MD  Patient Care Team: Johnny Massed, MD as PCP - General (Hematology) Johnny Massed, MD as Consulting Physician (Oncology)   ASSESSMENT & PLAN:   Assessment: 1.  Relapsed multiple myeloma: -Autologous stem cell transplant on 10/08/2017 -Post transplant consolidation with CyCarD for 2 cycles with M spike undetectable. -Maintenance therapy with carfilzomib 70 mg per metered square days 1 and 15 every 28 days from 04/20/2018, dose reduced to 35 mg per metered square on 01/05/2019, titrated up to 56 mg per metered square on 01/09/2020. 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 -PET scan on 02/18/2020 showed new left supraclavicular lymph node at the thoracic inlet, SUV 14.2.  Multifocal osseous lesions largely improved, though residual lesions noted including right acromion, left acromion, thyroid cartilage, right sternum.  Additional lesions throughout the sternum, bilateral ribs, thoracolumbar spine, bilateral pelvis have resolved. -BMBX on 02/14/2020 shows plasma cell myeloma, 60-70% of cells.  FISH for myeloma was negative.  Cytogenetics was normal. -4 cycles of daratumumab, bortezomib and dexamethasone from 02/28/2020 through 04/29/2020. -Daratumumab, pomalidomide and dexamethasone started on 05/28/2020.  He is receiving daratumumab every 2 weeks, Pomalyst 3 mg days 1-21, dexamethasone 20 mg weekly. -Pomalidomide dose reduced to 2 mg days 1-21 around the first week of July 2021, due to severe weakness.    Plan: 1.  Relapsed IgG kappa multiple myeloma: - Myeloma panel on 01/28/2024: M spike stable at 0.1 g.  FLC ratio normal at 1.05.  Immunofixation shows IgG kappa. - He was hospitalized from 02/16/2024 through 02/21/2024 with chronic maxillary sinusitis with facial numbness.  He underwent right partial  maxillectomy with extraction of remaining right maxillary molar, right first premolar and canine. - We held his pomalidomide and dexamethasone during that hospitalization. - He is currently receiving daptomycin 700 mg daily, ertapenem 1 g daily and micafungin 100 mg daily via PICC line for a total of 6 weeks until 03/29/2024. - Fungal cultures are still pending. - Labs today: White count 4.9, platelet count 232.  Hemoglobin 8.9.  LFTs are grossly normal.  Creatinine 1.18. - Recommend continue holding myeloma therapy until I see him back in 4 weeks with repeat myeloma labs.   2.  Myeloma bone disease: - Will hold denosumab.   3.  Macrocytic anemia: - This is from CKD and functional iron deficiency and myelosuppression. - Hemoglobin today is 8.9.  Will give him Retacrit today.   4.  ID prophylaxis: - Continue acyclovir twice daily.   5.  Peripheral neuropathy: - Continue gabapentin 600 mg 3 times daily.   6.  History of breast cancer: - Continue tamoxifen daily.  7.  Hypogammaglobulinemia: - Previous IgG levels are 196.  Will consider IVIG if recurrent infection.    No orders of the defined types were placed in this encounter.      Johnny Lucas,acting as a Neurosurgeon for Johnny Massed, MD.,have documented all relevant documentation on the behalf of Johnny Massed, MD,as directed by  Johnny Massed, MD while in the presence of Johnny Massed, MD.  I, Johnny Massed MD, have reviewed the above documentation for accuracy and completeness, and I agree with the above.      Johnny Massed, MD   3/6/20254:34 PM  CHIEF COMPLAINT:   Diagnosis: multiple myeloma    Cancer Staging  Multiple myeloma ([name] Staging form: Plasma Cell Myeloma and  Plasma Cell Disorders, AJCC 8th Edition - Clinical stage from 05/03/2017: Beta-2-microglobulin (mg/L): 3.5, Albumin (g/dL): 3.4, ISS: Stage II, High-risk cytogenetics: Absent, LDH: Not assessed - Signed by Johnny Newer, PA-C on 05/03/2017    Prior Therapy: 1. RVD x 6 cycles from 03/01/2017 to 06/29/2017. 2. Stem cell transplant on 09/30/2017. 3. Carfilzomib and cyclophosphamide x 2 cycles from 02/14/2018 to 03/29/2018. 4. Carfilzomib x 22 cycles from 04/20/2018 to 01/23/2020.  Current Therapy:  Darzalex Faspro monthly; Pomalyst 2 mg 3/4 weeks; Xgeva monthly    HISTORY OF PRESENT ILLNESS:   Oncology History  Breast cancer, male ([name]  12/31/2009 Initial Biopsy   Biopsy of L breast    12/31/2009 Pathology Results   Invasive ductal carcinoma, ER/PR+, HER 2 negative   12/31/2009 Imaging   Ultrasound showing a 2.43 x 1.85 x 3 cm hypoechoic spiculated mass in the 12 o clock L breast retroareolar region   01/01/2010 -  Anti-estrogen oral therapy   Tamoxifen 20 mg daily   01/06/2010 Imaging   Bone scan abnormal uptake in the diaphysis of the R humerus, abnormal in the R third, fifth and sixth ribs, lesion also noted in the sternum.   02/03/2010 Surgery   Rod placement and fixation of R humerus by Dr. Amanda Pea   02/05/2010 - 02/18/2010 Radiation Therapy   30Gy in 10 fractions of 3 Gy per fraction to R pathologic fracture   03/11/2010 -  Chemotherapy   Denosumab monthly, now every 3 months. Started at [facility]    06/09/2016 Imaging   Three hypermetabolic osseous lesions in the sternum, left ilium and right ilium, as discussed above, likely represent osseous metastases. At this time, these are not recognizable on the CT images. 2. No extra skeletal metastatic disease identified in the neck, chest, abdomen or pelvis.   10/13/2016 Progression   PET shows various new and enlarging osseous metastatic lesions with no definite extra osseous metastatic disease currently identified.    12/31/2016 Progression   1. Multifocal hypermetabolic osseous metastases throughout the axial and proximal appendicular skeleton, which are increased in size, number and metabolism since 10/13/2016 PET-CT. 2. 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 3. No additional sites of hypermetabolic metastatic disease. 4. Chronic right mastoid sinusitis. 5. Aortic atherosclerosis.  One vessel coronary atherosclerosis.   Multiple myeloma ([name]  02/12/2017 Bone Marrow Biopsy   The marrow was variably cellular with large peritrabecular aggregates of kappa restricted plasma cells (66% by aspirate, 30% by Cd138). Cytogenetics +11.    03/01/2017 - 06/29/2017 Chemotherapy   RVD    05/26/2017 Bone Marrow Biopsy   Performed at [name]:  Plasma cell myeloma in a 30% cellular marrow with decreased trilineage hematopoiesis and 42% kappa light chain restricted plasma cells on the aspirate smears and large aggregates on the core biopsy.     07/12/2017 - 09/01/2017 Chemotherapy   3 cycles of carfizolmib/cyclophosphamide/dexamethasone     10/08/2017 Bone Marrow Transplant   Autotransplant at [facility]   02/28/2020 - 09/01/2022 Chemotherapy   Patient is on Treatment Plan : Daratumumab q28d     09/01/2022 -  Chemotherapy   Patient is on Treatment Plan : MYELOMA Daratumumab Faspro Subq q28d     Multiple myeloma not having achieved remission ([name]  06/29/2017 Initial Diagnosis   Multiple myeloma not having achieved remission ([name]   02/28/2020 - 09/01/2022 Chemotherapy   Patient  is on Treatment Plan : Daratumumab q28d     09/01/2022 -  Chemotherapy   Patient is on Treatment Plan : MYELOMA Daratumumab Faspro Subq q28d        INTERVAL HISTORY:   Johnny Lucas is a 78 y.o. male presenting to clinic today for follow up of multiple myeloma. He was last seen by me on 02/03/24.  Since his last visit, he underwent right endoscopic sinus surgery under Dr. Ernestene Kiel on 02/16/24.   CT maxillofacial done on 02/10/24 found: Soft tissue thickening and edema at the right nasolabial fold, consistent with cellulitis.  No abscess or drainable fluid collection. Inflammatory change of the inferior and medial extraconal fat of the right orbit, likely orbital cellulitis. Periapical lucency at the root site of the absent tooth 4, which may be a source of the above described infectious/inflammatory processes. Chronic right-sided sinusitis.  Today, he states that he is doing well overall. His appetite level is at 75%. His energy level is at 75%.  He reports infectious disease told him he had a bacterial infection in the right maxillary region and had some of the bone in that area removed. Johnny Lucas is currently on 3 antibiotics, which will finish on 03/29/24. The numbness on the right side of the face has has improved though not resolved. Johnny Lucas started [employer] on 02/28/24.   PAST MEDICAL HISTORY:   Past Medical History: Past Medical History:  Diagnosis Date   Anxiety    Bone metastases 09/10/2016   Breast cancer, male [facility]) 2011   Stage IV breast cancer; radiation and tamoxifen  Overview:  Left breast ca with mets bone  Overview:  METS TO BONE   Bronchitis    uses inhaler   Cough    uses inhaler   GERD (gastroesophageal reflux disease)    Headache    History of blood transfusion    Hypertension    Macular degeneration    bilateral   Memory loss    mild   Multiple myeloma ([name] 02/18/2017   Peripheral neuropathy    feet   Pneumonia    mild case 1-2 yrs ago (2023 or 2024)    Surgical History: Past Surgical History:  Procedure Laterality Date   BACK SURGERY     C2-C3 fusion   BREAST BIOPSY Left    cancer   CATARACT EXTRACTION W/PHACO Left 02/03/2019   Procedure: CATARACT EXTRACTION PHACO AND INTRAOCULAR LENS PLACEMENT (IOC);  Surgeon: Fabio Pierce, MD;  Location: [facility];  Service: Ophthalmology;  Laterality: Left;  CDE: 15.89   CATARACT EXTRACTION W/PHACO Right 07/25/2021   Procedure: CATARACT EXTRACTION PHACO AND INTRAOCULAR LENS PLACEMENT (IOC);  Surgeon: Fabio Pierce, MD;  Location: [facility];   Service: Ophthalmology;  Laterality: Right;  CDE   26.71   COLONOSCOPY     HERNIA REPAIR     MAXILLARY ANTROSTOMY Right 02/16/2024   Procedure: REVISION MAXILLARY ANTROSTOMY WITH TISSUE REMOVAL;  Surgeon: [name], MD;  Location: [facility] OR;  Service: ENT;  Laterality: Right;   NASAL SINUS SURGERY Right 02/16/2024   Procedure: RIGHT ENDOSCOPIC SINUS SURGERY;  Surgeon: [name], MD;  Location: [facility] OR;  Service: ENT;  Laterality: Right;   NASAL TURBINATE REDUCTION Right 02/16/2024   Procedure: PARTIAL MAXILLECTOMY AND DENTAL EXTRACTIONS;  Surgeon: [name], MD;  Location: [facility] OR;  Service: ENT;  Laterality: Right;   PORTACATH PLACEMENT Left 06/10/2018   Procedure: INSERTION PORT-A-CATH;  Surgeon: Lucretia Roers, MD;  Location: [facility];  Service: General;  Laterality: Left;  right arm surgery     bone cancer and rod placed   SINUS ENDO WITH FUSION Bilateral 03/25/2023   Procedure: FUNCTIONAL ENDOSCOPIC SINUS SURGERY WITH FUSION AND NAVIGATION;  Surgeon: [name], MD;  Location: [facility] OR;  Service: ENT;  Laterality: Bilateral;    Social History: Social History   Socioeconomic History   Marital status: Married    Spouse name: Not on file   Number of children: 3   Years of education: Not on file   Highest education level: Not on file  Occupational History   Not on file  Tobacco Use   Smoking status: Never   Smokeless tobacco: Former    Types: Designer, multimedia Use   Vaping status: Never Used  Substance and Sexual Activity   Alcohol use: Yes    Alcohol/week: 24.0 standard drinks of alcohol    Types: 24 Cans of beer per week    Comment: 3-4 beers a week   Drug use: No   Sexual activity: Yes  Other Topics Concern   Not on file  Social History Narrative   Not on file   Social Drivers of Health   Financial Resource Strain: Low Risk  (11/26/2020)   Overall Financial Resource Strain (CARDIA)    Difficulty of Paying Living Expenses: Not hard at all  Food Insecurity: No  Food Insecurity (02/17/2024)   Hunger Vital Sign    Worried About Running Out of Food in the Last Year: Never true    Ran Out of Food in the Last Year: Never true  Transportation Needs: No Transportation Needs (02/17/2024)   PRAPARE - Administrator, Civil Service (Medical): No    Lack of Transportation (Non-Medical): No  Physical Activity: Inactive (11/26/2020)   Exercise Vital Sign    Days of Exercise per Week: 0 days    Minutes of Exercise per Session: 0 min  Stress: Stress Concern Present (11/26/2020)   [employer] of Occupational Health - Occupational Stress Questionnaire    Feeling of Stress : To some extent  Social Connections: Moderately Integrated (02/17/2024)   Social Connection and Isolation Panel [NHANES]    Frequency of Communication with Friends and Family: More than three times a week    Frequency of Social Gatherings with Friends and Family: Three times a week    Attends Religious Services: 1 to 4 times per year    Active Member of Clubs or Organizations: No    Attends Banker Meetings: Never    Marital Status: Married  Catering manager Violence: Not At Risk (02/17/2024)   Humiliation, Afraid, Rape, and Kick questionnaire    Fear of Current or Ex-Partner: No    Emotionally Abused: No    Physically Abused: No    Sexually Abused: No    Family History: Family History  Problem Relation Age of Onset   Stroke Mother    Cancer Maternal Aunt        cancer NOS; died in her 11s   Lung cancer Maternal Uncle        smoker    Current Medications:  Current Outpatient Medications:    acetaminophen (TYLENOL) 500 MG tablet, Take 1,000 mg by mouth every 8 (eight) hours as needed for mild pain (pain score 1-3) or moderate pain (pain score 4-6)., Disp: , Rfl:    acyclovir (ZOVIRAX) 800 MG tablet, TAKE 1 TABLET TWICE DAILY, Disp: 180 tablet, Rfl: 3   albuterol (VENTOLIN HFA) 108 (90 Base) MCG/ACT inhaler, Inhale 2  puffs into the lungs every 6 (six)  hours as needed for wheezing or shortness of breath., Disp: , Rfl:    ALPRAZolam (XANAX) 0.5 MG tablet, TAKE 1 TABLET BY MOUTH AT BEDTIME AS NEEDED FOR anxiety / SLEEP, Disp: 30 tablet, Rfl: 5   aspirin EC 81 MG tablet, Take 81 mg by mouth daily. , Disp: , Rfl:    cetirizine (ZYRTEC) 10 MG tablet, Take 10 mg by mouth daily., Disp: , Rfl:    chlorhexidine (PERIDEX) 0.12 % solution, Use as directed 15 mLs in the mouth or throat 4 (four) times daily - after meals and at bedtime for 21 days., Disp: 473 mL, Rfl: 1   daptomycin (CUBICIN) IVPB, Inject 700 mg into the vein daily. Indication:  Sinusitis/osteo First Dose: Yes Last Day of Therapy:  03/29/24 Labs - Once weekly:  CBC/D, BMP, and CPK Labs - Once weekly: ESR and CRP Method of administration: IV Push Method of administration may be changed at the discretion of home infusion pharmacist based upon assessment of the patient and/or caregiver's ability to self-administer the medication ordered., Disp: 37 Units, Rfl: 0   diphenhydrAMINE (BENADRYL) 12.5 MG/5ML elixir, Take 12.5 mg by mouth daily as needed for allergies., Disp: , Rfl:    ertapenem (INVANZ) IVPB, Inject 1 g into the vein daily. Indication:  Sinusitis/osteo First Dose: Yes Last Day of Therapy:  03/29/24 Labs - Once weekly:  CBC/D and BMP, Labs - Once weekly: ESR and CRP Method of administration: Mini-Bag Plus / Gravity Method of administration may be changed at the discretion of home infusion pharmacist based upon assessment of the patient and/or caregiver's ability to self-administer the medication ordered., Disp: 37 Units, Rfl: 0   folic acid (FOLVITE) 1 MG tablet, TAKE 1 TABLET BY MOUTH DAILY, Disp: 90 tablet, Rfl: 2   gabapentin (NEURONTIN) 300 MG capsule, Take 1 capsule (300 mg total) by mouth 3 (three) times daily., Disp: 180 capsule, Rfl: 2   Loperamide HCl (IMODIUM PO), Take 2 mg by mouth daily as needed (diarrhea)., Disp: , Rfl:    metoprolol succinate (TOPROL-XL) 100 MG 24 hr tablet, Take  100 mg by mouth in the morning., Disp: , Rfl:    micafungin (MYCAMINE) 50 MG injection, Inject 100 mg into the vein daily. Indication:  Sinusitis/osteo; First Dose: Yes; Last Day of Therapy:  03/29/24; Labs - Once weekly:  CBC/D and BMP, Labs - Once weekly: ESR and CRP; Method of administration: Elastomeric; Method of administration may be changed at the discretion of home infusion pharmacist based upon assessment of the patient and/or caregiver's ability to self-administer the medication ordered., Disp: 74 each, Rfl: 0   ondansetron (ZOFRAN) 8 MG tablet, TAKE 1 TABLET BY MOUTH EVERY 8 HOURS AS NEEDED FOR NAUSEA AND VOMITING (Patient not taking: Reported on 02/14/2024), Disp: 30 tablet, Rfl: 2   pantoprazole (PROTONIX) 40 MG tablet, TAKE 1 TABLET EVERY OTHER DAY, Disp: 45 tablet, Rfl: 3   polyethylene glycol (MIRALAX / GLYCOLAX) packet, Take 17 g by mouth daily as needed for mild constipation., Disp: 14 each, Rfl: 0   sodium chloride (OCEAN) 0.65 % SOLN nasal spray, Place 2 sprays into both nostrils 3 (three) times daily., Disp: 30 mL, Rfl: 1   tamoxifen (NOLVADEX) 20 MG tablet, TAKE 1 TABLET EVERY DAY, Disp: 90 tablet, Rfl: 3 No current facility-administered medications for this visit.  Facility-Administered Medications Ordered in Other Visits:    ondansetron (ZOFRAN) 8 mg in sodium chloride 0.9 % 50 mL IVPB, 8  mg, Intravenous, Once, Lockamy, Randi L, NP-C   sodium chloride flush (NS) 0.9 % injection 10 mL, 10 mL, Intracatheter, Once PRN, Johnny Massed, MD   Allergies: Allergies  Allergen Reactions   Cefepime     Suspected severe thrombocytopenia is a result of cefepime induced antigen platelet destruction    REVIEW OF SYSTEMS:   Review of Systems  Constitutional:  Negative for chills, fatigue and fever.  HENT:   Negative for lump/mass, mouth sores, nosebleeds, sore throat and trouble swallowing.        +nasal drainage +oral pain from surgery +occasional pain in right side of face   Eyes:  Negative for eye problems.  Respiratory:  Negative for cough and shortness of breath.   Cardiovascular:  Negative for chest pain, leg swelling and palpitations.  Gastrointestinal:  Negative for abdominal pain, constipation, diarrhea, nausea and vomiting.  Genitourinary:  Negative for bladder incontinence, difficulty urinating, dysuria, frequency, hematuria and nocturia.   Musculoskeletal:  Negative for arthralgias, back pain, flank pain, myalgias and neck pain.  Skin:  Negative for itching and rash.  Neurological:  Positive for numbness (in toes). Negative for dizziness and headaches.  Hematological:  Does not bruise/bleed easily.  Psychiatric/Behavioral:  Negative for depression, sleep disturbance and suicidal ideas. The patient is not nervous/anxious.   All other systems reviewed and are negative.    VITALS:   There were no vitals taken for this visit.  Wt Readings from Last 3 Encounters:  03/02/24 198 lb 3.1 oz (89.9 kg)  02/16/24 194 lb 7.1 oz (88.2 kg)  02/03/24 193 lb 12.6 oz (87.9 kg)    There is no height or weight on file to calculate BMI.  Performance status (ECOG): 1 - Symptomatic but completely ambulatory  PHYSICAL EXAM:   Physical Exam Vitals and nursing note reviewed. Exam conducted with a chaperone present.  Constitutional:      Appearance: Normal appearance.  Cardiovascular:     Rate and Rhythm: Normal rate and regular rhythm.     Pulses: Normal pulses.     Heart sounds: Normal heart sounds.  Pulmonary:     Effort: Pulmonary effort is normal.     Breath sounds: Normal breath sounds.  Abdominal:     Palpations: Abdomen is soft. There is no hepatomegaly, splenomegaly or mass.     Tenderness: There is no abdominal tenderness.  Musculoskeletal:     Right lower leg: No edema.     Left lower leg: No edema.  Lymphadenopathy:     Cervical: No cervical adenopathy.     Right cervical: No superficial, deep or posterior cervical adenopathy.    Left  cervical: No superficial, deep or posterior cervical adenopathy.     Upper Body:     Right upper body: No supraclavicular or axillary adenopathy.     Left upper body: No supraclavicular or axillary adenopathy.  Neurological:     General: No focal deficit present.     Mental Status: He is alert and oriented to person, place, and time.  Psychiatric:        Mood and Affect: Mood normal.        Behavior: Behavior normal.     LABS:      Latest Ref Rng & Units 03/02/2024   10:38 AM 02/21/2024   10:36 AM 01/28/2024   10:42 AM  CBC  WBC 4.0 - 10.5 K/uL 4.9  3.3  4.0   Hemoglobin 13.0 - 17.0 g/dL 8.9  8.4  16.1   Hematocrit  39.0 - 52.0 % 28.4  25.9  30.7   Platelets 150 - 400 K/uL 232  178  168       Latest Ref Rng & Units 03/02/2024   10:38 AM 02/17/2024    5:02 AM 01/28/2024   10:42 AM  CMP  Glucose 70 - 99 mg/dL 97  161  99   BUN 8 - 23 mg/dL 11  20  14    Creatinine 0.61 - 1.24 mg/dL 0.96  0.45  4.09   Sodium 135 - 145 mmol/L 139  136  137   Potassium 3.5 - 5.1 mmol/L 4.4  4.4  4.5   Chloride 98 - 111 mmol/L 108  107  105   CO2 22 - 32 mmol/L 24  20  23    Calcium 8.9 - 10.3 mg/dL 8.3  7.6  9.3   Total Protein 6.5 - 8.1 g/dL 5.4   5.5   Total Bilirubin 0.0 - 1.2 mg/dL 0.5   0.6   Alkaline Phos 38 - 126 U/L 35   34   AST 15 - 41 U/L 16   11   ALT 0 - 44 U/L 10   11      No results found for: "CEA1", "CEA" / No results found for: "CEA1", "CEA" No results found for: "PSA1" No results found for: "CAN199" No results found for: "CAN125"  Lab Results  Component Value Date   TOTALPROTELP 4.8 (L) 01/28/2024   TOTALPROTELP 4.9 (L) 01/28/2024   ALBUMINELP 3.0 01/28/2024   A1GS 0.3 01/28/2024   A2GS 0.7 01/28/2024   BETS 0.8 01/28/2024   GAMS 0.2 (L) 01/28/2024   MSPIKE 0.1 (H) 01/28/2024   SPEI Comment 01/28/2024   Lab Results  Component Value Date   TIBC 314 03/02/2023   TIBC 308 08/25/2022   TIBC 325 03/25/2020   FERRITIN 161 03/02/2023   FERRITIN 170 08/25/2022    FERRITIN 640 (H) 03/25/2020   IRONPCTSAT 11 (L) 03/02/2023   IRONPCTSAT 24 08/25/2022   IRONPCTSAT 10 (L) 03/25/2020   Lab Results  Component Value Date   LDH 131 11/18/2021   LDH 127 09/30/2021   LDH 102 08/05/2021     STUDIES:   MR ORBITS W WO CONTRAST Addendum Date: 02/17/2024 ADDENDUM REPORT: 02/17/2024 19:07 ADDENDUM: There is minimal inclusion of the mandible on this study. There is mild edema within the partially visualized head of the right mandibular condyle. Given that there is marked inflammatory change superficial and deep to the anterior wall of the right maxilla, there is likely osteomyelitis at this location, but there are no specific bone marrow changes visible on this study. Electronically Signed   By: Deatra Robinson M.D.   On: 02/17/2024 19:07   Result Date: 02/17/2024 CLINICAL DATA:  Invasive fungal sinusitis EXAM: MRI OF THE ORBITS WITHOUT AND WITH CONTRAST TECHNIQUE: Multiplanar, multi-echo pulse sequences of the orbits and surrounding structures were acquired including fat saturation techniques, before and after intravenous contrast administration. CONTRAST:  9mL GADAVIST GADOBUTROL 1 MMOL/ML IV SOLN COMPARISON:  02/10/2024 maxillofacial CT FINDINGS: Orbits: There is abnormal contrast enhancement of the extraconal fat of the inferior and medial right orbit. No abnormal enhancement of the extraocular muscles or optic nerve. The globe is normal. Left orbit is normal. Visualized sinuses: Diffuse right-sided paranasal sinus disease Soft tissues: Incompletely visualized abnormal contrast enhancement of the right temporalis and pterygoid muscles. Inflammatory change of the right pre maxillary fat. Limited intracranial: Negative IMPRESSION: 1. Abnormal contrast enhancement of the extraconal  fat of the inferior and medial right orbit, consistent with orbital cellulitis. 2. Diffuse right-sided paranasal sinus disease with abnormal contrast enhancement of the right masticator space and  pterygoid muscles, consistent with myositis. Electronically Signed: By: Deatra Robinson M.D. On: 02/16/2024 22:20   CT MAXILLOFACIAL W & WO CONTRAST Result Date: 02/15/2024 CLINICAL DATA:  Right facial swelling EXAM: CT MAXILLOFACIAL WITHOUT AND WITH CONTRAST TECHNIQUE: Multidetector CT imaging of the maxillofacial structures was performed without and with intravenous contrast. Multiplanar CT image reconstructions were also generated. RADIATION DOSE REDUCTION: This exam was performed according to the departmental dose-optimization program which includes automated exposure control, adjustment of the mA and/or kV according to patient size and/or use of iterative reconstruction technique. CONTRAST:  75mL OMNIPAQUE IOHEXOL 350 MG/ML SOLN COMPARISON:  None Available. FINDINGS: Osseous: Periapical lucency at the root site of the absent tooth 4. No fracture or aggressive osseous lesion. Chronic mucoperiosteal remodeling of the right maxillary, frontal and ethmoid sinuses. Orbits: There is inflammatory change of the inferior and medial extraconal fat of the right orbit Sinuses: Complete opacification of the right maxillary sinus. Moderate mucosal thickening of the right frontal, ethmoid and sphenoid sinuses. Soft tissues: Soft tissue thickening and edema at the right nasolabial fold. No abscess or drainable fluid collection. Limited intracranial: No significant or unexpected finding. IMPRESSION: 1. Soft tissue thickening and edema at the right nasolabial fold, consistent with cellulitis. No abscess or drainable fluid collection. 2. Inflammatory change of the inferior and medial extraconal fat of the right orbit, likely orbital cellulitis. 3. Periapical lucency at the root site of the absent tooth 4, which may be a source of the above described infectious/inflammatory processes. 4. Chronic right-sided sinusitis. Electronically Signed   By: Deatra Robinson M.D.   On: 02/15/2024 00:55

## 2024-03-03 ENCOUNTER — Other Ambulatory Visit: Payer: Self-pay

## 2024-03-06 ENCOUNTER — Inpatient Hospital Stay: Payer: Medicare Other

## 2024-03-06 ENCOUNTER — Inpatient Hospital Stay: Payer: Medicare Other | Admitting: Hematology

## 2024-03-07 ENCOUNTER — Inpatient Hospital Stay: Payer: Medicare Other

## 2024-03-07 DIAGNOSIS — J320 Chronic maxillary sinusitis: Secondary | ICD-10-CM | POA: Diagnosis not present

## 2024-03-07 DIAGNOSIS — I10 Essential (primary) hypertension: Secondary | ICD-10-CM | POA: Diagnosis not present

## 2024-03-07 DIAGNOSIS — K219 Gastro-esophageal reflux disease without esophagitis: Secondary | ICD-10-CM | POA: Diagnosis not present

## 2024-03-07 DIAGNOSIS — C9000 Multiple myeloma not having achieved remission: Secondary | ICD-10-CM | POA: Diagnosis not present

## 2024-03-07 DIAGNOSIS — F419 Anxiety disorder, unspecified: Secondary | ICD-10-CM | POA: Diagnosis not present

## 2024-03-07 DIAGNOSIS — Z792 Long term (current) use of antibiotics: Secondary | ICD-10-CM | POA: Diagnosis not present

## 2024-03-07 DIAGNOSIS — G629 Polyneuropathy, unspecified: Secondary | ICD-10-CM | POA: Diagnosis not present

## 2024-03-07 DIAGNOSIS — Z452 Encounter for adjustment and management of vascular access device: Secondary | ICD-10-CM | POA: Diagnosis not present

## 2024-03-07 DIAGNOSIS — Z48813 Encounter for surgical aftercare following surgery on the respiratory system: Secondary | ICD-10-CM | POA: Diagnosis not present

## 2024-03-08 ENCOUNTER — Telehealth: Payer: Self-pay

## 2024-03-08 ENCOUNTER — Telehealth (INDEPENDENT_AMBULATORY_CARE_PROVIDER_SITE_OTHER): Payer: Medicare Other | Admitting: Internal Medicine

## 2024-03-08 ENCOUNTER — Encounter: Payer: Self-pay | Admitting: Internal Medicine

## 2024-03-08 DIAGNOSIS — B954 Other streptococcus as the cause of diseases classified elsewhere: Secondary | ICD-10-CM

## 2024-03-08 DIAGNOSIS — J320 Chronic maxillary sinusitis: Secondary | ICD-10-CM

## 2024-03-08 MED ORDER — AMOXICILLIN-POT CLAVULANATE 875-125 MG PO TABS: 1.0000 | ORAL_TABLET | Freq: Two times a day (BID) | ORAL | 0 refills | Status: DC

## 2024-03-08 NOTE — Telephone Encounter (Signed)
 Per Dr Drue Second pt will continue IV antibiotics through 3/19. Picc line can be removed after last dose. Message forwarded to [facility] team. Juanita Laster, RMA

## 2024-03-08 NOTE — Progress Notes (Signed)
 Patient ID: Johnny Lucas, male   DOB: [birth year], 78 y.o.   MRN: [id number]  Virtual Visit via video/Telephone Note  I connected with Johnny Lucas on 04/26/24 at 10:30 AM EDT by telephone and verified that I am speaking with the correct person using two identifiers.  Location: Patient: at home Provider: in clinic   I discussed the limitations, risks, security and privacy concerns of performing an evaluation and management service by telephone and the availability of in person appointments. I also discussed with the patient that there may be a patient responsible charge related to this service. The patient expressed understanding and agreed to proceed.    Hospital Course: Johnny Lucas is an 78 y.o. male who was admitted 02/16/2024 with a chief complaint of No chief complaint on file. , and found to have a diagnosis of Maxillary sinusitis, chronic.  They were brought to the operating room on 02/16/2024 and underwent the above named procedures.  He underwent daily oral cavity packing changes demonstrating viable tissues without worsening infection. Infectious disease was consulted and assisted in directing therapy which included systemic abx and antifungals throughout his hospitalization and upon discharge. The patient had improvement in facial numbness on the day of discharge.  Surgeries: Procedure(s): RIGHT ENDOSCOPIC SINUS SURGERY REVISION MAXILLARY ANTROSTOMY WITH TISSUE REMOVAL PARTIAL MAXILLECTOMY AND DENTAL EXTRACTIONS on 02/16/2024       Component Ref Range & Units (hover) 3 wk ago  Specimen Description WOUND  Special Requests SWAB OF MAXILLARY SINUS SAMPLE A  Gram Stain FEW WBC PRESENT, PREDOMINANTLY PMN FEW GRAM POSITIVE COCCI FEW GRAM NEGATIVE RODS RARE GRAM POSITIVE RODS Performed at [facility], [address], [place], [place] [address]  Culture FEW EIKENELLA CORRODENS Usually susceptible to penicillin and other beta lactam agents,quinolones,macrolides and  tetracyclines. RARE STREPTOCOCCUS ANGINOSIS RARE ACTINOMYCES [id number] Standardized susceptibility testing for this organism is not available. MIXED ANAEROBIC FLORA PRESENT.  CALL LAB IF FURTHER IID REQUIRED.  Report Status 02/22/2024 FINAL  Organism ID, Bacteria STREPTOCOCCUS ANGINOSIS  Resulting Agency [facility]     Susceptibility   Streptococcus anginosis    MIC    CEFTRIAXONE  <=0.12 SENS... Sensitive    LEVOFLOXACIN <=0.25 SENS... Sensitive    PENICILLIN <=0.06 SENS... Sensitive    VANCOMYCIN  0.5 SENSITIVE Sensitive       Outpatient Encounter Medications as of 03/08/2024  Medication Sig   acetaminophen  (TYLENOL ) 500 MG tablet Take 1,000 mg by mouth every 8 (eight) hours as needed for mild pain (pain score 1-3) or moderate pain (pain score 4-6).   acyclovir  (ZOVIRAX ) 800 MG tablet TAKE 1 TABLET TWICE DAILY   albuterol  (VENTOLIN  HFA) 108 (90 Base) MCG/ACT inhaler Inhale 2 puffs into the lungs every 6 (six) hours as needed for wheezing or shortness of breath.   ALPRAZolam  (XANAX ) 0.5 MG tablet TAKE 1 TABLET BY MOUTH AT BEDTIME AS NEEDED FOR anxiety / SLEEP   aspirin  EC 81 MG tablet Take 81 mg by mouth daily.    cetirizine  (ZYRTEC ) 10 MG tablet Take 10 mg by mouth daily.   chlorhexidine  (PERIDEX ) 0.12 % solution Use as directed 15 mLs in the mouth or throat 4 (four) times daily - after meals and at bedtime for 21 days.   daptomycin  (CUBICIN ) IVPB Inject 700 mg into the vein daily. Indication:  Sinusitis/osteo First Dose: Yes Last Day of Therapy:  03/29/24 Labs - Once weekly:  CBC/D, BMP, and CPK Labs - Once weekly: ESR and CRP Method of  administration: IV Push Method of administration may be changed at the discretion of home infusion pharmacist based upon assessment of the patient and/or caregiver's ability to self-administer the medication ordered.   diphenhydrAMINE  (BENADRYL ) 12.5 MG/5ML elixir Take 12.5 mg by mouth daily as needed for allergies.   ertapenem  (INVANZ ) IVPB  Inject 1 g into the vein daily. Indication:  Sinusitis/osteo First Dose: Yes Last Day of Therapy:  03/29/24 Labs - Once weekly:  CBC/D and BMP, Labs - Once weekly: ESR and CRP Method of administration: Mini-Bag Plus / Gravity Method of administration may be changed at the discretion of home infusion pharmacist based upon assessment of the patient and/or caregiver's ability to self-administer the medication ordered.   folic acid  (FOLVITE ) 1 MG tablet TAKE 1 TABLET BY MOUTH DAILY   gabapentin  (NEURONTIN ) 300 MG capsule Take 1 capsule (300 mg total) by mouth 3 (three) times daily.   Loperamide HCl (IMODIUM PO) Take 2 mg by mouth daily as needed (diarrhea).   metoprolol  succinate (TOPROL -XL) 100 MG 24 hr tablet Take 100 mg by mouth in the morning.   micafungin  (MYCAMINE ) 50 MG injection Inject 100 mg into the vein daily. Indication:  Sinusitis/osteo; First Dose: Yes; Last Day of Therapy:  03/29/24; Labs - Once weekly:  CBC/D and BMP, Labs - Once weekly: ESR and CRP; Method of administration: Elastomeric; Method of administration may be changed at the discretion of home infusion pharmacist based upon assessment of the patient and/or caregiver's ability to self-administer the medication ordered.   ondansetron  (ZOFRAN ) 8 MG tablet TAKE 1 TABLET BY MOUTH EVERY 8 HOURS AS NEEDED FOR NAUSEA AND VOMITING   pantoprazole  (PROTONIX ) 40 MG tablet TAKE 1 TABLET EVERY OTHER DAY   polyethylene glycol (MIRALAX  / GLYCOLAX ) packet Take 17 g by mouth daily as needed for mild constipation.   sodium chloride  (OCEAN) 0.65 % SOLN nasal spray Place 2 sprays into both nostrils 3 (three) times daily.   tamoxifen  (NOLVADEX ) 20 MG tablet TAKE 1 TABLET EVERY DAY   Facility-Administered Encounter Medications as of 03/08/2024  Medication   ondansetron  (ZOFRAN ) 8 mg in sodium chloride  0.9 % 50 mL IVPB   sodium chloride  flush (NS) 0.9 % injection 10 mL     Patient Active Problem List   Diagnosis Date Noted   Osteomyelitis ([name]  02/21/2024   Chronic maxillary sinusitis 02/16/2024   Right facial numbness 02/16/2024   Maxillary sinusitis, chronic 02/16/2024   Iron deficiency anemia 04/27/2023   Drug-induced neutropenia ([name] 12/29/2022   Hypomagnesemia 10/01/2020   Parainfluenza infection 11/30/2018   HCAP (healthcare-associated pneumonia) 11/29/2018   Sepsis due to pneumonia ([name] 11/29/2018   Immunocompromised ([name] 11/29/2018   Fever and chills 11/29/2018   Generalized weakness 11/29/2018   Peripheral neuropathy 11/29/2018   Dehydration 11/29/2018   Anemia due to chemotherapy 11/29/2018   AKI (acute kidney injury) ([name] 11/29/2018   Hypotension 11/29/2018   Essential hypertension 04/21/2018   Multiple myeloma not having achieved remission ([name] 06/29/2017   Multiple myeloma ([name] 02/18/2017   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   Vasculogenic erectile dysfunction 01/06/2017   Arthritis 09/10/2016   Retinopathy 09/10/2016   Malignant neoplasm metastatic to bone ([name] 09/10/2016   Use of tamoxifen  (Nolvadex ) 10/25/2015   Breast cancer, male [facility])    TIA (transient ischemic attack) 11/12/2013   GERD 10/07/2010   Constipation 10/07/2010   RECTAL BLEEDING 10/07/2010     Health Maintenance Due  Topic Date Due   Medicare Annual Wellness (AWV)  Never done  COVID-19 Vaccine (1) Never done   Hepatitis C Screening  Never done   Zoster Vaccines- Shingrix (1 of 2) Never done   Colonoscopy  Never done   Pneumonia Vaccine 95+ Years old (3 of 3 - PCV) 09/26/2020     Review of Systems  Physical Exam  Fluent speech  CBC Lab Results  Component Value Date   WBC 4.9 03/02/2024   RBC 2.69 (L) 03/02/2024   HGB 8.9 (L) 03/02/2024   HCT 28.4 (L) 03/02/2024   PLT 232 03/02/2024   MCV 105.6 (H) 03/02/2024   MCH 33.1 03/02/2024   MCHC 31.3 03/02/2024   RDW 16.1 (H) 03/02/2024   LYMPHSABS 1.0 03/02/2024   MONOABS 0.3 03/02/2024   EOSABS 0.4 03/02/2024    BMET Lab Results  Component Value Date   NA 139  03/02/2024   K 4.4 03/02/2024   CL 108 03/02/2024   CO2 24 03/02/2024   GLUCOSE 97 03/02/2024   BUN 11 03/02/2024   CREATININE 1.18 03/02/2024   CALCIUM  8.3 (L) 03/02/2024   GFRNONAA >60 03/02/2024   GFRAA 60 (L) 09/17/2020      Assessment and Plan  Plan to continue iv abtx through 3/19 to finish out 4 wk then we will transition to amox/clav to covere remaining species found on culture  Reach out to [facility] to stop abtx on 3/19  Start amox/clav 875mg  bid ( #60 -- to start on 3/20)  Continue with packing, recommend to follow up also with dentistry to see that it is healing sufficiently. Would avoid implant for dental resconstruction. Can consider partial denture or bridge to compensate for extracted teeth  Cc. Siegfried Dress hoshal

## 2024-03-09 ENCOUNTER — Other Ambulatory Visit: Payer: Self-pay

## 2024-03-13 DIAGNOSIS — K219 Gastro-esophageal reflux disease without esophagitis: Secondary | ICD-10-CM | POA: Diagnosis not present

## 2024-03-13 DIAGNOSIS — Z452 Encounter for adjustment and management of vascular access device: Secondary | ICD-10-CM | POA: Diagnosis not present

## 2024-03-13 DIAGNOSIS — Z48813 Encounter for surgical aftercare following surgery on the respiratory system: Secondary | ICD-10-CM | POA: Diagnosis not present

## 2024-03-13 DIAGNOSIS — J320 Chronic maxillary sinusitis: Secondary | ICD-10-CM | POA: Diagnosis not present

## 2024-03-13 DIAGNOSIS — C9000 Multiple myeloma not having achieved remission: Secondary | ICD-10-CM | POA: Diagnosis not present

## 2024-03-13 DIAGNOSIS — Z792 Long term (current) use of antibiotics: Secondary | ICD-10-CM | POA: Diagnosis not present

## 2024-03-13 DIAGNOSIS — I10 Essential (primary) hypertension: Secondary | ICD-10-CM | POA: Diagnosis not present

## 2024-03-13 DIAGNOSIS — F419 Anxiety disorder, unspecified: Secondary | ICD-10-CM | POA: Diagnosis not present

## 2024-03-13 DIAGNOSIS — G629 Polyneuropathy, unspecified: Secondary | ICD-10-CM | POA: Diagnosis not present

## 2024-03-14 DIAGNOSIS — J320 Chronic maxillary sinusitis: Secondary | ICD-10-CM | POA: Diagnosis not present

## 2024-03-14 DIAGNOSIS — C9000 Multiple myeloma not having achieved remission: Secondary | ICD-10-CM | POA: Diagnosis not present

## 2024-03-14 DIAGNOSIS — R200 Anesthesia of skin: Secondary | ICD-10-CM | POA: Diagnosis not present

## 2024-03-14 DIAGNOSIS — M272 Inflammatory conditions of jaws: Secondary | ICD-10-CM | POA: Diagnosis not present

## 2024-03-15 ENCOUNTER — Other Ambulatory Visit: Payer: Self-pay

## 2024-03-16 DIAGNOSIS — K219 Gastro-esophageal reflux disease without esophagitis: Secondary | ICD-10-CM | POA: Diagnosis not present

## 2024-03-16 DIAGNOSIS — Z48813 Encounter for surgical aftercare following surgery on the respiratory system: Secondary | ICD-10-CM | POA: Diagnosis not present

## 2024-03-16 DIAGNOSIS — G629 Polyneuropathy, unspecified: Secondary | ICD-10-CM | POA: Diagnosis not present

## 2024-03-16 DIAGNOSIS — I10 Essential (primary) hypertension: Secondary | ICD-10-CM | POA: Diagnosis not present

## 2024-03-16 DIAGNOSIS — F419 Anxiety disorder, unspecified: Secondary | ICD-10-CM | POA: Diagnosis not present

## 2024-03-16 DIAGNOSIS — C9000 Multiple myeloma not having achieved remission: Secondary | ICD-10-CM | POA: Diagnosis not present

## 2024-03-16 LAB — FUNGAL ORGANISM REFLEX

## 2024-03-16 LAB — FUNGUS CULTURE WITH STAIN

## 2024-03-16 LAB — FUNGUS CULTURE RESULT

## 2024-03-22 ENCOUNTER — Inpatient Hospital Stay

## 2024-03-22 DIAGNOSIS — D631 Anemia in chronic kidney disease: Secondary | ICD-10-CM | POA: Diagnosis not present

## 2024-03-22 DIAGNOSIS — I129 Hypertensive chronic kidney disease with stage 1 through stage 4 chronic kidney disease, or unspecified chronic kidney disease: Secondary | ICD-10-CM | POA: Diagnosis not present

## 2024-03-22 DIAGNOSIS — D801 Nonfamilial hypogammaglobulinemia: Secondary | ICD-10-CM | POA: Diagnosis not present

## 2024-03-22 DIAGNOSIS — C7951 Secondary malignant neoplasm of bone: Secondary | ICD-10-CM

## 2024-03-22 DIAGNOSIS — C9002 Multiple myeloma in relapse: Secondary | ICD-10-CM | POA: Diagnosis not present

## 2024-03-22 DIAGNOSIS — Z79899 Other long term (current) drug therapy: Secondary | ICD-10-CM | POA: Diagnosis not present

## 2024-03-22 DIAGNOSIS — N184 Chronic kidney disease, stage 4 (severe): Secondary | ICD-10-CM | POA: Diagnosis not present

## 2024-03-22 DIAGNOSIS — C9000 Multiple myeloma not having achieved remission: Secondary | ICD-10-CM

## 2024-03-22 LAB — COMPREHENSIVE METABOLIC PANEL
ALT: 14 U/L (ref 0–44)
AST: 19 U/L (ref 15–41)
Albumin: 3.4 g/dL — ABNORMAL LOW (ref 3.5–5.0)
Alkaline Phosphatase: 34 U/L — ABNORMAL LOW (ref 38–126)
Anion gap: 9 (ref 5–15)
BUN: 12 mg/dL (ref 8–23)
CO2: 19 mmol/L — ABNORMAL LOW (ref 22–32)
Calcium: 7.8 mg/dL — ABNORMAL LOW (ref 8.9–10.3)
Chloride: 110 mmol/L (ref 98–111)
Creatinine, Ser: 1.2 mg/dL (ref 0.61–1.24)
GFR, Estimated: >60 mL/min (ref >60)
Glucose, Bld: 96 mg/dL (ref 70–99)
Potassium: 4.5 mmol/L (ref 3.5–5.1)
Sodium: 138 mmol/L (ref 135–145)
Total Bilirubin: 0.6 mg/dL (ref 0.0–1.2)
Total Protein: 5.6 g/dL — ABNORMAL LOW (ref 6.5–8.1)

## 2024-03-22 LAB — CBC WITH DIFFERENTIAL/PLATELET
Abs Immature Granulocytes: 0.01 10*3/uL (ref 0.00–0.07)
Basophils Absolute: 0.1 10*3/uL (ref 0.0–0.1)
Basophils Relative: 1 %
Eosinophils Absolute: 0.2 10*3/uL (ref 0.0–0.5)
Eosinophils Relative: 3 %
HCT: 32.2 % — ABNORMAL LOW (ref 39.0–52.0)
Hemoglobin: 10.3 g/dL — ABNORMAL LOW (ref 13.0–17.0)
Immature Granulocytes: 0 %
Lymphocytes Relative: 28 %
Lymphs Abs: 1.5 10*3/uL (ref 0.7–4.0)
MCH: 33.9 pg (ref 26.0–34.0)
MCHC: 32 g/dL (ref 30.0–36.0)
MCV: 105.9 fL — ABNORMAL HIGH (ref 80.0–100.0)
Monocytes Absolute: 0.4 10*3/uL (ref 0.1–1.0)
Monocytes Relative: 7 %
Neutro Abs: 3.3 10*3/uL (ref 1.7–7.7)
Neutrophils Relative %: 61 %
Platelets: 203 10*3/uL (ref 150–400)
RBC: 3.04 MIL/uL — ABNORMAL LOW (ref 4.22–5.81)
RDW: 16.2 % — ABNORMAL HIGH (ref 11.5–15.5)
WBC: 5.5 10*3/uL (ref 4.0–10.5)
nRBC: 0 % (ref 0.0–0.2)

## 2024-03-22 LAB — MAGNESIUM: Magnesium: 2.2 mg/dL (ref 1.7–2.4)

## 2024-03-22 MED ORDER — SODIUM CHLORIDE 0.9% FLUSH
10.0000 mL | INTRAVENOUS | Status: DC | PRN
  Administered 2024-03-22: 10 mL via INTRAVENOUS

## 2024-03-22 MED ORDER — HEPARIN SOD (PORK) LOCK FLUSH 100 UNIT/ML IV SOLN
500.0000 [IU] | Freq: Once | INTRAVENOUS | Status: AC
  Administered 2024-03-22: 500 [IU] via INTRAVENOUS

## 2024-03-22 NOTE — Progress Notes (Signed)
 Johnny Lucas presented for Portacath access and flush and labs. Portacath located left chest wall accessed with  H 20 needle.  Good blood return present. Portacath flushed with 20ml NS and 500U/64ml Heparin and needle removed intact. No bruising or swelling noted at the site.  Procedure tolerated well and without incident.     Discharged from clinic ambulatory in stable condition. Alert and oriented x 3. F/U with [facility] as scheduled.

## 2024-03-23 LAB — KAPPA/LAMBDA LIGHT CHAINS
Kappa free light chain: 1.1 mg/L — ABNORMAL LOW (ref 3.3–19.4)
Kappa, lambda light chain ratio: >0.73 (ref 0.26–1.65)
Lambda free light chains: <1.5 mg/L — ABNORMAL LOW (ref 5.7–26.3)

## 2024-03-24 LAB — IMMUNOFIXATION ELECTROPHORESIS
IgA: 6 mg/dL — ABNORMAL LOW (ref 61–437)
IgG (Immunoglobin G), Serum: 178 mg/dL — ABNORMAL LOW (ref 603–1613)
IgM (Immunoglobulin M), Srm: 5 mg/dL — ABNORMAL LOW (ref 15–143)
Total Protein ELP: 5.1 g/dL — ABNORMAL LOW (ref 6.0–8.5)

## 2024-03-27 ENCOUNTER — Other Ambulatory Visit: Payer: Self-pay | Admitting: Hematology

## 2024-03-27 DIAGNOSIS — C9000 Multiple myeloma not having achieved remission: Secondary | ICD-10-CM

## 2024-03-27 LAB — PROTEIN ELECTROPHORESIS, SERUM
A/G Ratio: 1.8 — ABNORMAL HIGH (ref 0.7–1.7)
Albumin ELP: 3.2 g/dL (ref 2.9–4.4)
Alpha-1-Globulin: 0.3 g/dL (ref 0.0–0.4)
Alpha-2-Globulin: 0.6 g/dL (ref 0.4–1.0)
Beta Globulin: 0.8 g/dL (ref 0.7–1.3)
Gamma Globulin: 0.2 g/dL — ABNORMAL LOW (ref 0.4–1.8)
Globulin, Total: 1.8 g/dL — ABNORMAL LOW (ref 2.2–3.9)
Total Protein ELP: 5 g/dL — ABNORMAL LOW (ref 6.0–8.5)

## 2024-03-29 ENCOUNTER — Inpatient Hospital Stay: Attending: Hematology | Admitting: Hematology

## 2024-03-29 VITALS — BP 137/81 | HR 86 | Temp 98.0°F | Resp 16 | Wt 192.9 lb

## 2024-03-29 DIAGNOSIS — Z79624 Long term (current) use of inhibitors of nucleotide synthesis: Secondary | ICD-10-CM | POA: Insufficient documentation

## 2024-03-29 DIAGNOSIS — I129 Hypertensive chronic kidney disease with stage 1 through stage 4 chronic kidney disease, or unspecified chronic kidney disease: Secondary | ICD-10-CM | POA: Insufficient documentation

## 2024-03-29 DIAGNOSIS — D801 Nonfamilial hypogammaglobulinemia: Secondary | ICD-10-CM | POA: Insufficient documentation

## 2024-03-29 DIAGNOSIS — Z801 Family history of malignant neoplasm of trachea, bronchus and lung: Secondary | ICD-10-CM | POA: Diagnosis not present

## 2024-03-29 DIAGNOSIS — Z7982 Long term (current) use of aspirin: Secondary | ICD-10-CM | POA: Insufficient documentation

## 2024-03-29 DIAGNOSIS — E611 Iron deficiency: Secondary | ICD-10-CM | POA: Diagnosis not present

## 2024-03-29 DIAGNOSIS — C9000 Multiple myeloma not having achieved remission: Secondary | ICD-10-CM

## 2024-03-29 DIAGNOSIS — Z9221 Personal history of antineoplastic chemotherapy: Secondary | ICD-10-CM | POA: Insufficient documentation

## 2024-03-29 DIAGNOSIS — Z800 Family history of malignant neoplasm of digestive organs: Secondary | ICD-10-CM | POA: Diagnosis not present

## 2024-03-29 DIAGNOSIS — G629 Polyneuropathy, unspecified: Secondary | ICD-10-CM | POA: Diagnosis not present

## 2024-03-29 DIAGNOSIS — Z87891 Personal history of nicotine dependence: Secondary | ICD-10-CM | POA: Insufficient documentation

## 2024-03-29 DIAGNOSIS — Z853 Personal history of malignant neoplasm of breast: Secondary | ICD-10-CM | POA: Insufficient documentation

## 2024-03-29 DIAGNOSIS — R531 Weakness: Secondary | ICD-10-CM | POA: Insufficient documentation

## 2024-03-29 DIAGNOSIS — C9002 Multiple myeloma in relapse: Secondary | ICD-10-CM | POA: Insufficient documentation

## 2024-03-29 DIAGNOSIS — Z923 Personal history of irradiation: Secondary | ICD-10-CM | POA: Diagnosis not present

## 2024-03-29 DIAGNOSIS — Z79899 Other long term (current) drug therapy: Secondary | ICD-10-CM | POA: Insufficient documentation

## 2024-03-29 NOTE — Progress Notes (Signed)
 [facility] [address] [place] [address]    Clinic Day:  03/29/2024  Referring physician: Doreatha Massed, MD  Patient Care Team: Doreatha Massed, MD as PCP - General (Hematology) Doreatha Massed, MD as Consulting Physician (Oncology)   ASSESSMENT & PLAN:   Assessment: 1.  Relapsed multiple myeloma: -Autologous stem cell transplant on 10/08/2017 -Post transplant consolidation with CyCarD for 2 cycles with M spike undetectable. -Maintenance therapy with carfilzomib 70 mg per metered square days 1 and 15 every 28 days from 04/20/2018, dose reduced to 35 mg per metered square on 01/05/2019, titrated up to 56 mg per metered square on 01/09/2020. 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 -PET scan on 02/18/2020 showed new left supraclavicular lymph node at the thoracic inlet, SUV 14.2.  Multifocal osseous lesions largely improved, though residual lesions noted including right acromion, left acromion, thyroid cartilage, right sternum.  Additional lesions throughout the sternum, bilateral ribs, thoracolumbar spine, bilateral pelvis have resolved. -BMBX on 02/14/2020 shows plasma cell myeloma, 60-70% of cells.  FISH for myeloma was negative.  Cytogenetics was normal. -4 cycles of daratumumab, bortezomib and dexamethasone from 02/28/2020 through 04/29/2020. -Daratumumab, pomalidomide and dexamethasone started on 05/28/2020.  He is receiving daratumumab every 2 weeks, Pomalyst 3 mg days 1-21, dexamethasone 20 mg weekly. -Pomalidomide dose reduced to 2 mg days 1-21 around the first week of July 2021, due to severe weakness.    Plan: 1.  Relapsed IgG kappa multiple myeloma: - Recent hospitalization from 02/16/2024 through 02/21/2024 with chronic maxillary sinusitis with facial numbness.  He underwent right partial maxillectomy and extraction of remaining right maxillary molar, first premolar and canine. - Myeloma treatments  with pomalidomide and dexamethasone were held during hospitalization. - He received IV antibiotics with daptomycin, ertapenem and micafungin until 03/15/2024.  He was started on Augmentin 875 mg twice daily on 03/16/2024 by Dr. Ilsa Iha (infectious disease) for at least 4 weeks. - I reviewed labs from 03/22/2024.  M spike is not observed.  Creatinine normal at 1.20 and calcium 7.8 with albumin 3.4.  Immunofixation shows IgG kappa.  Kappa light chains are 1.1 and lambda light chains<1.5 and ratio > 0.73. - As he is still receiving antibiotics for infection, I have recommended to hold off restarting myeloma therapy.  I will reevaluate him in 4 weeks.  He reports that he has an appointment to see myeloma team at [facility] on the 24th.    2.  Myeloma bone disease: - Continue to hold denosumab.   3.  Macrocytic anemia: - This is from CKD, functional iron deficiency and myelosuppression. - Continue Retacrit if hemoglobin drops below 9.   4.  ID prophylaxis: - Continue acyclovir twice daily.   5.  Peripheral neuropathy: - Continue gabapentin 600 mg 3 times daily.   6.  History of breast cancer: - Continue tamoxifen daily.  7.  Hypogammaglobulinemia: - Latest IgG levels are 178.  Will consider IVIG if recurrent infection.    No orders of the defined types were placed in this encounter.     Alben Deeds Teague,acting as a Neurosurgeon for Doreatha Massed, MD.,have documented all relevant documentation on the behalf of Doreatha Massed, MD,as directed by  Doreatha Massed, MD while in the presence of Doreatha Massed, MD.  I, Doreatha Massed MD, have reviewed the above documentation for accuracy and completeness, and I agree with the above.       Doreatha Massed, MD   4/2/20254:02 PM  CHIEF COMPLAINT:   Diagnosis: multiple myeloma    Cancer Staging  Multiple myeloma ([name] Staging form: Plasma Cell Myeloma and Plasma Cell Disorders, AJCC 8th Edition -  Clinical stage from 05/03/2017: Beta-2-microglobulin (mg/L): 3.5, Albumin (g/dL): 3.4, ISS: Stage II, High-risk cytogenetics: Absent, LDH: Not assessed - Signed by Ellouise Newer, PA-C on 05/03/2017    Prior Therapy: 1. RVD x 6 cycles from 03/01/2017 to 06/29/2017. 2. Stem cell transplant on 09/30/2017. 3. Carfilzomib and cyclophosphamide x 2 cycles from 02/14/2018 to 03/29/2018. 4. Carfilzomib x 22 cycles from 04/20/2018 to 01/23/2020.  Current Therapy:  Darzalex Faspro monthly; Pomalyst 2 mg 3/4 weeks; Xgeva monthly    HISTORY OF PRESENT ILLNESS:   Oncology History  Breast cancer, male ([name]  12/31/2009 Initial Biopsy   Biopsy of L breast    12/31/2009 Pathology Results   Invasive ductal carcinoma, ER/PR+, HER 2 negative   12/31/2009 Imaging   Ultrasound showing a 2.43 x 1.85 x 3 cm hypoechoic spiculated mass in the 12 o clock L breast retroareolar region   01/01/2010 -  Anti-estrogen oral therapy   Tamoxifen 20 mg daily   01/06/2010 Imaging   Bone scan abnormal uptake in the diaphysis of the R humerus, abnormal in the R third, fifth and sixth ribs, lesion also noted in the sternum.   02/03/2010 Surgery   Rod placement and fixation of R humerus by Dr. Amanda Pea   02/05/2010 - 02/18/2010 Radiation Therapy   30Gy in 10 fractions of 3 Gy per fraction to R pathologic fracture   03/11/2010 -  Chemotherapy   Denosumab monthly, now every 3 months. Started at [facility]    06/09/2016 Imaging   Three hypermetabolic osseous lesions in the sternum, left ilium and right ilium, as discussed above, likely represent osseous metastases. At this time, these are not recognizable on the CT images. 2. No extra skeletal metastatic disease identified in the neck, chest, abdomen or pelvis.   10/13/2016 Progression   PET shows various new and enlarging osseous metastatic lesions with no definite extra osseous metastatic disease currently identified.    12/31/2016 Progression   1. Multifocal hypermetabolic osseous  metastases throughout the axial and proximal appendicular skeleton, which are increased in size, number and metabolism since 10/13/2016 PET-CT. 2. 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 3. No additional sites of hypermetabolic metastatic disease. 4. Chronic right mastoid sinusitis. 5. Aortic atherosclerosis.  One vessel coronary atherosclerosis.   Multiple myeloma ([name]  02/12/2017 Bone Marrow Biopsy   The marrow was variably cellular with large peritrabecular aggregates of kappa restricted plasma cells (66% by aspirate, 30% by Cd138). Cytogenetics +11.    03/01/2017 - 06/29/2017 Chemotherapy   RVD    05/26/2017 Bone Marrow Biopsy   Performed at [facility]:  Plasma cell myeloma in a 30% cellular marrow with decreased trilineage hematopoiesis and 42% kappa light chain restricted plasma cells on the aspirate smears and large aggregates on the core biopsy.     07/12/2017 - 09/01/2017 Chemotherapy   3 cycles of carfizolmib/cyclophosphamide/dexamethasone     10/08/2017 Bone Marrow Transplant   Autotransplant at [facility]   02/28/2020 - 09/01/2022 Chemotherapy   Patient is on Treatment Plan : Daratumumab q28d     09/01/2022 -  Chemotherapy   Patient is on Treatment Plan : MYELOMA Daratumumab Faspro Subq q28d     Multiple myeloma not having achieved remission ([name]  06/29/2017 Initial Diagnosis   Multiple myeloma not having achieved remission ([name]   02/28/2020 - 09/01/2022 Chemotherapy   Patient is on Treatment Plan : Daratumumab q28d     09/01/2022 -  Chemotherapy   Patient is on Treatment Plan : MYELOMA Daratumumab Faspro Subq q28d        INTERVAL HISTORY:   Lequan is a 78 y.o. male presenting to clinic today for follow up of multiple myeloma. He was last seen by me on 03/02/24.  Today, he states that he is doing well overall. His appetite level is  at 100%. His energy level is at 80%.  PAST MEDICAL HISTORY:   Past Medical History: Past Medical History:  Diagnosis Date   Anxiety    Bone metastases 09/10/2016   Breast cancer, male [facility]) 2011   Stage IV breast cancer; radiation and tamoxifen  Overview:  Left breast ca with mets bone  Overview:  METS TO BONE   Bronchitis    uses inhaler   Cough    uses inhaler   GERD (gastroesophageal reflux disease)    Headache    History of blood transfusion    Hypertension    Macular degeneration    bilateral   Memory loss    mild   Multiple myeloma ([name] 02/18/2017   Peripheral neuropathy    feet   Pneumonia    mild case 1-2 yrs ago (2023 or 2024)    Surgical History: Past Surgical History:  Procedure Laterality Date   BACK SURGERY     C2-C3 fusion   BREAST BIOPSY Left    cancer   CATARACT EXTRACTION W/PHACO Left 02/03/2019   Procedure: CATARACT EXTRACTION PHACO AND INTRAOCULAR LENS PLACEMENT (IOC);  Surgeon: Fabio Pierce, MD;  Location: [facility];  Service: Ophthalmology;  Laterality: Left;  CDE: 15.89   CATARACT EXTRACTION W/PHACO Right 07/25/2021   Procedure: CATARACT EXTRACTION PHACO AND INTRAOCULAR LENS PLACEMENT (IOC);  Surgeon: Fabio Pierce, MD;  Location: [facility];  Service: Ophthalmology;  Laterality: Right;  CDE   26.71   COLONOSCOPY     HERNIA REPAIR     MAXILLARY ANTROSTOMY Right 02/16/2024   Procedure: REVISION MAXILLARY ANTROSTOMY WITH TISSUE REMOVAL;  Surgeon: [name], MD;  Location: [facility] OR;  Service: ENT;  Laterality: Right;   NASAL SINUS SURGERY Right 02/16/2024   Procedure: RIGHT ENDOSCOPIC SINUS SURGERY;  Surgeon: [name], MD;  Location: [facility] OR;  Service: ENT;  Laterality: Right;   NASAL TURBINATE REDUCTION Right 02/16/2024   Procedure: PARTIAL MAXILLECTOMY AND DENTAL EXTRACTIONS;  Surgeon: [name], MD;  Location: [facility] OR;  Service: ENT;  Laterality: Right;   PORTACATH PLACEMENT Left 06/10/2018   Procedure: INSERTION PORT-A-CATH;  Surgeon:  Lucretia Roers, MD;  Location: [facility];  Service: General;  Laterality: Left;   right arm surgery     bone cancer and rod placed   SINUS ENDO WITH FUSION Bilateral 03/25/2023   Procedure: FUNCTIONAL ENDOSCOPIC SINUS SURGERY WITH FUSION AND NAVIGATION;  Surgeon: [name], MD;  Location: [facility] OR;  Service: ENT;  Laterality: Bilateral;    Social History: Social History   Socioeconomic History   Marital status: Married    Spouse name: Not on file   Number of children: 3   Years of education: Not on file   Highest education level: Not on file  Occupational History   Not on file  Tobacco Use   Smoking status: Never   Smokeless tobacco: Former    Types: Engineer, drilling  Vaping status: Never Used  Substance and Sexual Activity   Alcohol use: Yes    Alcohol/week: 24.0 standard drinks of alcohol    Types: 24 Cans of beer per week    Comment: 3-4 beers a week   Drug use: No   Sexual activity: Yes  Other Topics Concern   Not on file  Social History Narrative   Not on file   Social Drivers of Health   Financial Resource Strain: Low Risk  (11/26/2020)   Overall Financial Resource Strain (CARDIA)    Difficulty of Paying Living Expenses: Not hard at all  Food Insecurity: No Food Insecurity (02/17/2024)   Hunger Vital Sign    Worried About Running Out of Food in the Last Year: Never true    Ran Out of Food in the Last Year: Never true  Transportation Needs: No Transportation Needs (02/17/2024)   PRAPARE - Administrator, Civil Service (Medical): No    Lack of Transportation (Non-Medical): No  Physical Activity: Inactive (11/26/2020)   Exercise Vital Sign    Days of Exercise per Week: 0 days    Minutes of Exercise per Session: 0 min  Stress: Stress Concern Present (11/26/2020)   [employer] of Occupational Health - Occupational Stress Questionnaire    Feeling of Stress : To some extent  Social Connections: Moderately Integrated (02/17/2024)   Social  Connection and Isolation Panel [NHANES]    Frequency of Communication with Friends and Family: More than three times a week    Frequency of Social Gatherings with Friends and Family: Three times a week    Attends Religious Services: 1 to 4 times per year    Active Member of Clubs or Organizations: No    Attends Banker Meetings: Never    Marital Status: Married  Catering manager Violence: Not At Risk (02/17/2024)   Humiliation, Afraid, Rape, and Kick questionnaire    Fear of Current or Ex-Partner: No    Emotionally Abused: No    Physically Abused: No    Sexually Abused: No    Family History: Family History  Problem Relation Age of Onset   Stroke Mother    Cancer Maternal Aunt        cancer NOS; died in her 25s   Lung cancer Maternal Uncle        smoker    Current Medications:  Current Outpatient Medications:    acetaminophen (TYLENOL) 500 MG tablet, Take 1,000 mg by mouth every 8 (eight) hours as needed for mild pain (pain score 1-3) or moderate pain (pain score 4-6)., Disp: , Rfl:    acyclovir (ZOVIRAX) 800 MG tablet, TAKE 1 TABLET TWICE DAILY, Disp: 180 tablet, Rfl: 3   albuterol (VENTOLIN HFA) 108 (90 Base) MCG/ACT inhaler, Inhale 2 puffs into the lungs every 6 (six) hours as needed for wheezing or shortness of breath., Disp: , Rfl:    ALPRAZolam (XANAX) 0.5 MG tablet, TAKE 1 TABLET BY MOUTH AT BEDTIME AS NEEDED FOR anxiety / SLEEP, Disp: 30 tablet, Rfl: 5   amoxicillin-clavulanate (AUGMENTIN) 875-125 MG tablet, Take 1 tablet by mouth 2 (two) times daily. Start on 03/16/24, Disp: 60 tablet, Rfl: 0   aspirin EC 81 MG tablet, Take 81 mg by mouth daily. , Disp: , Rfl:    cetirizine (ZYRTEC) 10 MG tablet, Take 10 mg by mouth daily., Disp: , Rfl:    chlorhexidine (PERIDEX) 0.12 % solution, , Disp: , Rfl:    daptomycin (CUBICIN) IVPB, Inject 700  mg into the vein daily. Indication:  Sinusitis/osteo First Dose: Yes Last Day of Therapy:  03/29/24 Labs - Once weekly:  CBC/D,  BMP, and CPK Labs - Once weekly: ESR and CRP Method of administration: IV Push Method of administration may be changed at the discretion of home infusion pharmacist based upon assessment of the patient and/or caregiver's ability to self-administer the medication ordered., Disp: 37 Units, Rfl: 0   diphenhydrAMINE (BENADRYL) 12.5 MG/5ML elixir, Take 12.5 mg by mouth daily as needed for allergies., Disp: , Rfl:    ertapenem (INVANZ) IVPB, Inject 1 g into the vein daily. Indication:  Sinusitis/osteo First Dose: Yes Last Day of Therapy:  03/29/24 Labs - Once weekly:  CBC/D and BMP, Labs - Once weekly: ESR and CRP Method of administration: Mini-Bag Plus / Gravity Method of administration may be changed at the discretion of home infusion pharmacist based upon assessment of the patient and/or caregiver's ability to self-administer the medication ordered., Disp: 37 Units, Rfl: 0   folic acid (FOLVITE) 1 MG tablet, TAKE 1 TABLET BY MOUTH DAILY, Disp: 90 tablet, Rfl: 2   gabapentin (NEURONTIN) 300 MG capsule, Take 1 capsule (300 mg total) by mouth 3 (three) times daily., Disp: 180 capsule, Rfl: 2   Loperamide HCl (IMODIUM PO), Take 2 mg by mouth daily as needed (diarrhea)., Disp: , Rfl:    metoprolol succinate (TOPROL-XL) 100 MG 24 hr tablet, Take 100 mg by mouth in the morning., Disp: , Rfl:    mupirocin ointment (BACTROBAN) 2 %, Apply 1 Application topically 3 (three) times daily., Disp: , Rfl:    ondansetron (ZOFRAN) 8 MG tablet, TAKE 1 TABLET BY MOUTH EVERY 8 HOURS AS NEEDED FOR NAUSEA AND VOMITING, Disp: 30 tablet, Rfl: 2   pantoprazole (PROTONIX) 40 MG tablet, TAKE 1 TABLET EVERY OTHER DAY, Disp: 45 tablet, Rfl: 3   polyethylene glycol (MIRALAX / GLYCOLAX) packet, Take 17 g by mouth daily as needed for mild constipation., Disp: 14 each, Rfl: 0   POMALYST 2 MG capsule, Take by mouth., Disp: , Rfl:    tamoxifen (NOLVADEX) 20 MG tablet, TAKE 1 TABLET EVERY DAY, Disp: 90 tablet, Rfl: 3   sodium chloride (OCEAN)  0.65 % SOLN nasal spray, Place 2 sprays into both nostrils 3 (three) times daily., Disp: 30 mL, Rfl: 1 No current facility-administered medications for this visit.  Facility-Administered Medications Ordered in Other Visits:    ondansetron (ZOFRAN) 8 mg in sodium chloride 0.9 % 50 mL IVPB, 8 mg, Intravenous, Once, Lockamy, Randi L, NP-C   sodium chloride flush (NS) 0.9 % injection 10 mL, 10 mL, Intracatheter, Once PRN, Doreatha Massed, MD   Allergies: Allergies  Allergen Reactions   Cefepime     Suspected severe thrombocytopenia is a result of cefepime induced antigen platelet destruction    REVIEW OF SYSTEMS:   Review of Systems  Constitutional:  Negative for chills, fatigue and fever.  HENT:   Negative for lump/mass, mouth sores, nosebleeds, sore throat and trouble swallowing.   Eyes:  Negative for eye problems.  Respiratory:  Negative for cough and shortness of breath.   Cardiovascular:  Negative for chest pain, leg swelling and palpitations.  Gastrointestinal:  Negative for abdominal pain, constipation, diarrhea, nausea and vomiting.  Genitourinary:  Negative for bladder incontinence, difficulty urinating, dysuria, frequency, hematuria and nocturia.   Musculoskeletal:  Negative for arthralgias, back pain, flank pain, myalgias and neck pain.  Skin:  Negative for itching and rash.  Neurological:  Positive for headaches and  numbness. Negative for dizziness.  Hematological:  Does not bruise/bleed easily.  Psychiatric/Behavioral:  Negative for depression, sleep disturbance and suicidal ideas. The patient is not nervous/anxious.   All other systems reviewed and are negative.    VITALS:   Blood pressure 137/81, pulse 86, temperature 98 F (36.7 C), temperature source Oral, resp. rate 16, weight 192 lb 14.4 oz (87.5 kg), SpO2 100%.  Wt Readings from Last 3 Encounters:  03/29/24 192 lb 14.4 oz (87.5 kg)  03/02/24 198 lb 3.1 oz (89.9 kg)  02/16/24 194 lb 7.1 oz (88.2 kg)     Body mass index is 28.49 kg/m.  Performance status (ECOG): 1 - Symptomatic but completely ambulatory  PHYSICAL EXAM:   Physical Exam Vitals and nursing note reviewed. Exam conducted with a chaperone present.  Constitutional:      Appearance: Normal appearance.  Cardiovascular:     Rate and Rhythm: Normal rate and regular rhythm.     Pulses: Normal pulses.     Heart sounds: Normal heart sounds.  Pulmonary:     Effort: Pulmonary effort is normal.     Breath sounds: Normal breath sounds.  Abdominal:     Palpations: Abdomen is soft. There is no hepatomegaly, splenomegaly or mass.     Tenderness: There is no abdominal tenderness.  Musculoskeletal:     Right lower leg: No edema.     Left lower leg: No edema.  Lymphadenopathy:     Cervical: No cervical adenopathy.     Right cervical: No superficial, deep or posterior cervical adenopathy.    Left cervical: No superficial, deep or posterior cervical adenopathy.     Upper Body:     Right upper body: No supraclavicular or axillary adenopathy.     Left upper body: No supraclavicular or axillary adenopathy.  Neurological:     General: No focal deficit present.     Mental Status: He is alert and oriented to person, place, and time.  Psychiatric:        Mood and Affect: Mood normal.        Behavior: Behavior normal.     LABS:      Latest Ref Rng & Units 03/22/2024    1:15 PM 03/02/2024   10:38 AM 02/21/2024   10:36 AM  CBC  WBC 4.0 - 10.5 K/uL 5.5  4.9  3.3   Hemoglobin 13.0 - 17.0 g/dL 16.1  8.9  8.4   Hematocrit 39.0 - 52.0 % 32.2  28.4  25.9   Platelets 150 - 400 K/uL 203  232  178       Latest Ref Rng & Units 03/22/2024    1:15 PM 03/02/2024   10:38 AM 02/17/2024    5:02 AM  CMP  Glucose 70 - 99 mg/dL 96  97  096   BUN 8 - 23 mg/dL 12  11  20    Creatinine 0.61 - 1.24 mg/dL 0.45  4.09  8.11   Sodium 135 - 145 mmol/L 138  139  136   Potassium 3.5 - 5.1 mmol/L 4.5  4.4  4.4   Chloride 98 - 111 mmol/L 110  108  107   CO2  22 - 32 mmol/L 19  24  20    Calcium 8.9 - 10.3 mg/dL 7.8  8.3  7.6   Total Protein 6.5 - 8.1 g/dL 5.6  5.4    Total Bilirubin 0.0 - 1.2 mg/dL 0.6  0.5    Alkaline Phos 38 - 126 U/L 34  35  AST 15 - 41 U/L 19  16    ALT 0 - 44 U/L 14  10       No results found for: "CEA1", "CEA" / No results found for: "CEA1", "CEA" No results found for: "PSA1" No results found for: "ZOX096" No results found for: "CAN125"  Lab Results  Component Value Date   TOTALPROTELP 5.1 (L) 03/22/2024   TOTALPROTELP 5.0 (L) 03/22/2024   ALBUMINELP 3.2 03/22/2024   A1GS 0.3 03/22/2024   A2GS 0.6 03/22/2024   BETS 0.8 03/22/2024   GAMS 0.2 (L) 03/22/2024   MSPIKE Not Observed 03/22/2024   SPEI Comment 03/22/2024   Lab Results  Component Value Date   TIBC 314 03/02/2023   TIBC 308 08/25/2022   TIBC 325 03/25/2020   FERRITIN 161 03/02/2023   FERRITIN 170 08/25/2022   FERRITIN 640 (H) 03/25/2020   IRONPCTSAT 11 (L) 03/02/2023   IRONPCTSAT 24 08/25/2022   IRONPCTSAT 10 (L) 03/25/2020   Lab Results  Component Value Date   LDH 131 11/18/2021   LDH 127 09/30/2021   LDH 102 08/05/2021     STUDIES:   No results found.

## 2024-03-29 NOTE — Patient Instructions (Addendum)
 [facility] at [facility] Discharge Instructions   You were seen and examined today by Dr. Ellin Saba.  He reviewed the results of your lab work which are normal/stable.   We will continue to hold your treatment until your antibiotics are complete.   We will see you back in 4 weeks. We will repeat lab work prior to this visit.    Return as scheduled.    Thank you for choosing [facility] at [facility] to provide your oncology and hematology care.  To afford each patient quality time with our provider, please arrive at least 15 minutes before your scheduled appointment time.   If you have a lab appointment with the Cancer Center please come in thru the Main Entrance and check in at the main information desk.  You need to re-schedule your appointment should you arrive 10 or more minutes late.  We strive to give you quality time with our providers, and arriving late affects you and other patients whose appointments are after yours.  Also, if you no show three or more times for appointments you may be dismissed from the clinic at the providers discretion.     Again, thank you for choosing [facility]  Our hope is that these requests will decrease the amount of time that you wait before being seen by our physicians.       _____________________________________________________________  Should you have questions after your visit to [facility] please contact our office at [phone] and follow the prompts.  Our office hours are 8:00 a.m. and 4:30 p.m. Monday - Friday.  Please note that voicemails left after 4:00 p.m. may not be returned until the following business day.  We are closed weekends and major holidays.  You do have access to a nurse 24-7, just call the main number to the clinic [phone] and do not press any options, hold on the line and a nurse will answer the phone.    For prescription refill requests, have  your pharmacy contact our office and allow 72 hours.    Due to Covid, you will need to wear a mask upon entering the hospital. If you do not have a mask, a mask will be given to you at the Main Entrance upon arrival. For doctor visits, patients may have 1 support person age 3 or older with them. For treatment visits, patients can not have anyone with them due to social distancing guidelines and our immunocompromised population.

## 2024-03-30 ENCOUNTER — Other Ambulatory Visit: Payer: Self-pay

## 2024-04-05 ENCOUNTER — Ambulatory Visit: Admitting: Internal Medicine

## 2024-04-07 ENCOUNTER — Other Ambulatory Visit: Payer: Self-pay

## 2024-04-12 ENCOUNTER — Other Ambulatory Visit: Payer: Self-pay | Admitting: *Deleted

## 2024-04-14 DIAGNOSIS — J320 Chronic maxillary sinusitis: Secondary | ICD-10-CM | POA: Diagnosis not present

## 2024-04-14 DIAGNOSIS — C9000 Multiple myeloma not having achieved remission: Secondary | ICD-10-CM | POA: Diagnosis not present

## 2024-04-14 DIAGNOSIS — R200 Anesthesia of skin: Secondary | ICD-10-CM | POA: Diagnosis not present

## 2024-04-14 DIAGNOSIS — M272 Inflammatory conditions of jaws: Secondary | ICD-10-CM | POA: Diagnosis not present

## 2024-04-16 ENCOUNTER — Other Ambulatory Visit: Payer: Self-pay | Admitting: Hematology

## 2024-04-16 DIAGNOSIS — C9000 Multiple myeloma not having achieved remission: Secondary | ICD-10-CM

## 2024-04-17 ENCOUNTER — Encounter: Payer: Self-pay | Admitting: Hematology

## 2024-04-17 ENCOUNTER — Encounter (HOSPITAL_COMMUNITY): Payer: Self-pay | Admitting: Hematology

## 2024-04-20 DIAGNOSIS — J320 Chronic maxillary sinusitis: Secondary | ICD-10-CM | POA: Diagnosis not present

## 2024-04-20 DIAGNOSIS — M272 Inflammatory conditions of jaws: Secondary | ICD-10-CM | POA: Diagnosis not present

## 2024-04-20 DIAGNOSIS — Z9484 Stem cells transplant status: Secondary | ICD-10-CM | POA: Diagnosis not present

## 2024-04-20 DIAGNOSIS — T451X5A Adverse effect of antineoplastic and immunosuppressive drugs, initial encounter: Secondary | ICD-10-CM | POA: Diagnosis not present

## 2024-04-20 DIAGNOSIS — J324 Chronic pansinusitis: Secondary | ICD-10-CM | POA: Diagnosis not present

## 2024-04-20 DIAGNOSIS — N1831 Chronic kidney disease, stage 3a: Secondary | ICD-10-CM | POA: Diagnosis not present

## 2024-04-20 DIAGNOSIS — G620 Drug-induced polyneuropathy: Secondary | ICD-10-CM | POA: Diagnosis not present

## 2024-04-20 DIAGNOSIS — C50929 Malignant neoplasm of unspecified site of unspecified male breast: Secondary | ICD-10-CM | POA: Diagnosis not present

## 2024-04-20 DIAGNOSIS — C9000 Multiple myeloma not having achieved remission: Secondary | ICD-10-CM | POA: Diagnosis not present

## 2024-04-26 ENCOUNTER — Other Ambulatory Visit: Payer: Self-pay | Admitting: *Deleted

## 2024-04-26 ENCOUNTER — Inpatient Hospital Stay (HOSPITAL_BASED_OUTPATIENT_CLINIC_OR_DEPARTMENT_OTHER): Admitting: Hematology

## 2024-04-26 VITALS — BP 131/62 | HR 57 | Temp 96.3°F | Resp 20 | Wt 198.4 lb

## 2024-04-26 DIAGNOSIS — C9000 Multiple myeloma not having achieved remission: Secondary | ICD-10-CM | POA: Diagnosis not present

## 2024-04-26 DIAGNOSIS — G629 Polyneuropathy, unspecified: Secondary | ICD-10-CM | POA: Diagnosis not present

## 2024-04-26 DIAGNOSIS — C9002 Multiple myeloma in relapse: Secondary | ICD-10-CM | POA: Diagnosis not present

## 2024-04-26 DIAGNOSIS — R531 Weakness: Secondary | ICD-10-CM | POA: Diagnosis not present

## 2024-04-26 DIAGNOSIS — E611 Iron deficiency: Secondary | ICD-10-CM | POA: Diagnosis not present

## 2024-04-26 DIAGNOSIS — D801 Nonfamilial hypogammaglobulinemia: Secondary | ICD-10-CM | POA: Diagnosis not present

## 2024-04-26 DIAGNOSIS — I129 Hypertensive chronic kidney disease with stage 1 through stage 4 chronic kidney disease, or unspecified chronic kidney disease: Secondary | ICD-10-CM | POA: Diagnosis not present

## 2024-04-26 MED ORDER — POMALYST 2 MG PO CAPS: 2.0000 mg | ORAL_CAPSULE | Freq: Every day | ORAL | 0 refills | Status: DC

## 2024-04-26 NOTE — Progress Notes (Signed)
 [facility] [address] [place] [address]    Clinic Day:  04/26/2024  Referring physician: Paulett Boros, MD  Patient Care Team: Paulett Boros, MD as PCP - General (Hematology) Paulett Boros, MD as Consulting Physician (Oncology)   ASSESSMENT & PLAN:   Assessment: 1.  Relapsed multiple myeloma: -Autologous stem cell transplant on 10/08/2017 -Post transplant consolidation with CyCarD for 2 cycles with M spike undetectable. -Maintenance therapy with carfilzomib  70 mg per metered square days 1 and 15 every 28 days from 04/20/2018, dose reduced to 35 mg per metered square on 01/05/2019, titrated up to 56 mg per metered square on 01/09/2020. 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 -PET scan on 02/18/2020 showed new left supraclavicular lymph node at the thoracic inlet, SUV 14.2.  Multifocal osseous lesions largely improved, though residual lesions noted including right acromion, left acromion, thyroid  cartilage, right sternum.  Additional lesions throughout the sternum, bilateral ribs, thoracolumbar spine, bilateral pelvis have resolved. -BMBX on 02/14/2020 shows plasma cell myeloma, 60-70% of cells.  FISH for myeloma was negative.  Cytogenetics was normal. -4 cycles of daratumumab , bortezomib  and dexamethasone  from 02/28/2020 through 04/29/2020. -Daratumumab , pomalidomide  and dexamethasone  started on 05/28/2020.  He is receiving daratumumab  every 2 weeks, Pomalyst  3 mg days 1-21, dexamethasone  20 mg weekly. -Pomalidomide  dose reduced to 2 mg days 1-21 around the first week of July 2021, due to severe weakness. - Pomalidomide  and Darzalex  held from 02/16/2024 through 04/26/2024 due to hospitalization, right partial maxillectomy and extraction of remaining right maxillary molar, first premolar and canine, required prolonged antibiotics    Plan: 1.  Relapsed IgG kappa multiple myeloma: - He has completed  Augmentin  last week. - We reviewed myeloma labs from 04/20/2024: SPEP and immunofixation were normal. - Kappa light chain 0.95, lambda light chain <1.47. - Will resume monthly Darzalex  and pomalidomide  3 mg on days 1-21 every 28 days.  He will take dexamethasone  20 mg on the day of Darzalex  injection. - RTC 4 weeks for follow-up.    2.  Myeloma bone disease: - Continue to hold denosumab .   3.  Macrocytic anemia: - Multifactorial, CKD, functional iron deficiency and myelosuppression.  Continue Retacrit  if hemoglobin drops below 9.   4.  ID prophylaxis: - Continue acyclovir  twice daily.   5.  Peripheral neuropathy: - Continue gabapentin  600 mg 3 times daily.   6.  History of breast cancer: - Continue tamoxifen  daily.  7.  Hypogammaglobulinemia: - Recent IgG level [facility]. - Will start him on IVIG 40 g every 4 weeks.    No orders of the defined types were placed in this encounter.    Paulett Boros, MD   4/30/20259:35 AM  CHIEF COMPLAINT:   Diagnosis: multiple myeloma    Cancer Staging  Multiple myeloma ([name] Staging form: Plasma Cell Myeloma and Plasma Cell Disorders, AJCC 8th Edition - Clinical stage from 05/03/2017: Beta-2 -microglobulin (mg/L): 3.5, Albumin  (g/dL): 3.4, ISS: Stage II, High-risk cytogenetics: Absent, LDH: Not assessed - Signed by Doretta Gant, PA-C on 05/03/2017    Prior Therapy: 1. RVD x 6 cycles from 03/01/2017 to 06/29/2017. 2. Stem cell transplant on 09/30/2017. 3. Carfilzomib  and cyclophosphamide  x 2 cycles from 02/14/2018 to 03/29/2018. 4. Carfilzomib  x 22 cycles from 04/20/2018 to 01/23/2020.  Current Therapy:  Darzalex  Faspro monthly; Pomalyst  2 mg 3/4 weeks; Xgeva  monthly    HISTORY OF PRESENT ILLNESS:   Oncology History  Breast cancer, male ([name]  12/31/2009 Initial  Biopsy   Biopsy of L breast    12/31/2009 Pathology Results   Invasive ductal carcinoma, ER/PR+, HER 2 negative   12/31/2009 Imaging   Ultrasound showing a  2.43 x 1.85 x 3 cm hypoechoic spiculated mass in the 12 o clock L breast retroareolar region   01/01/2010 -  Anti-estrogen oral therapy   Tamoxifen  20 mg daily   01/06/2010 Imaging   Bone scan abnormal uptake in the diaphysis of the R humerus, abnormal in the R third, fifth and sixth ribs, lesion also noted in the sternum.   02/03/2010 Surgery   Rod placement and fixation of R humerus by Dr. Aloha Arnold   02/05/2010 - 02/18/2010 Radiation Therapy   30Gy in 10 fractions of 3 Gy per fraction to R pathologic fracture   03/11/2010 -  Chemotherapy   Denosumab  monthly, now every 3 months. Started at [facility]    06/09/2016 Imaging   Three hypermetabolic osseous lesions in the sternum, left ilium and right ilium, as discussed above, likely represent osseous metastases. At this time, these are not recognizable on the CT images. 2. No extra skeletal metastatic disease identified in the neck, chest, abdomen or pelvis.   10/13/2016 Progression   PET shows various new and enlarging osseous metastatic lesions with no definite extra osseous metastatic disease currently identified.    12/31/2016 Progression   1. Multifocal hypermetabolic osseous metastases throughout the axial and proximal appendicular skeleton, which are increased in size, number and metabolism since 10/13/2016 PET-CT. 2. 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 3. No additional sites of hypermetabolic metastatic disease. 4. Chronic right mastoid sinusitis. 5. Aortic atherosclerosis.  One vessel coronary atherosclerosis.   Multiple myeloma ([name]  02/12/2017 Bone Marrow Biopsy   The marrow was variably cellular with large peritrabecular aggregates of kappa restricted plasma cells (66% by aspirate, 30% by Cd138). Cytogenetics +11.    03/01/2017 - 06/29/2017 Chemotherapy   RVD    05/26/2017 Bone Marrow Biopsy   Performed at  [facility]:  Plasma cell myeloma in a 30% cellular marrow with decreased trilineage hematopoiesis and 42% kappa light chain restricted plasma cells on the aspirate smears and large aggregates on the core biopsy.     07/12/2017 - 09/01/2017 Chemotherapy   3 cycles of carfizolmib/cyclophosphamide /dexamethasone      10/08/2017 Bone Marrow Transplant   Autotransplant at [facility]   02/28/2020 - 09/01/2022 Chemotherapy   Patient is on Treatment Plan : Daratumumab  q28d     09/01/2022 -  Chemotherapy   Patient is on Treatment Plan : MYELOMA Daratumumab  Faspro Subq q28d     Multiple myeloma not having achieved remission ([name]  06/29/2017 Initial Diagnosis   Multiple myeloma not having achieved remission ([name]   02/28/2020 - 09/01/2022 Chemotherapy   Patient is on Treatment Plan : Daratumumab  q28d     09/01/2022 -  Chemotherapy   Patient is on Treatment Plan : MYELOMA Daratumumab  Faspro Subq q28d        INTERVAL HISTORY:   Crash is a 78 y.o. male seen for follow-up of multiple myeloma.  Reports appetite 50% and energy level 75%.  Overall he is feeling better and finished antibiotics last week.  PAST MEDICAL HISTORY:   Past Medical History: Past Medical History:  Diagnosis Date   Anxiety    Bone metastases 09/10/2016   Breast cancer, male [facility]) 2011   Stage IV breast cancer; radiation  and tamoxifen   Overview:  Left breast ca with mets bone  Overview:  METS TO BONE   Bronchitis    uses inhaler   Cough    uses inhaler   GERD (gastroesophageal reflux disease)    Headache    History of blood transfusion    Hypertension    Macular degeneration    bilateral   Memory loss    mild   Multiple myeloma ([name] 02/18/2017   Peripheral neuropathy    feet   Pneumonia    mild case 1-2 yrs ago (2023 or 2024)    Surgical History: Past Surgical History:  Procedure Laterality Date   BACK SURGERY     C2-C3 fusion   BREAST BIOPSY Left    cancer   CATARACT EXTRACTION W/PHACO  Left 02/03/2019   Procedure: CATARACT EXTRACTION PHACO AND INTRAOCULAR LENS PLACEMENT (IOC);  Surgeon: Tarri Farm, MD;  Location: [facility];  Service: Ophthalmology;  Laterality: Left;  CDE: 15.89   CATARACT EXTRACTION W/PHACO Right 07/25/2021   Procedure: CATARACT EXTRACTION PHACO AND INTRAOCULAR LENS PLACEMENT (IOC);  Surgeon: Tarri Farm, MD;  Location: [facility];  Service: Ophthalmology;  Laterality: Right;  CDE   26.71   COLONOSCOPY     HERNIA REPAIR     MAXILLARY ANTROSTOMY Right 02/16/2024   Procedure: REVISION MAXILLARY ANTROSTOMY WITH TISSUE REMOVAL;  Surgeon: Rush Coupe, MD;  Location: [facility] OR;  Service: ENT;  Laterality: Right;   NASAL SINUS SURGERY Right 02/16/2024   Procedure: RIGHT ENDOSCOPIC SINUS SURGERY;  Surgeon: Rush Coupe, MD;  Location: [facility] OR;  Service: ENT;  Laterality: Right;   NASAL TURBINATE REDUCTION Right 02/16/2024   Procedure: PARTIAL MAXILLECTOMY AND DENTAL EXTRACTIONS;  Surgeon: Rush Coupe, MD;  Location: [facility] OR;  Service: ENT;  Laterality: Right;   PORTACATH PLACEMENT Left 06/10/2018   Procedure: INSERTION PORT-A-CATH;  Surgeon: Awilda Bogus, MD;  Location: [facility];  Service: General;  Laterality: Left;   right arm surgery     bone cancer and rod placed   SINUS ENDO WITH FUSION Bilateral 03/25/2023   Procedure: FUNCTIONAL ENDOSCOPIC SINUS SURGERY WITH FUSION AND NAVIGATION;  Surgeon: Rush Coupe, MD;  Location: [facility] OR;  Service: ENT;  Laterality: Bilateral;    Social History: Social History   Socioeconomic History   Marital status: Married    Spouse name: Not on file   Number of children: 3   Years of education: Not on file   Highest education level: Not on file  Occupational History   Not on file  Tobacco Use   Smoking status: Never   Smokeless tobacco: Former    Types: Designer, multimedia Use   Vaping status: Never Used  Substance and Sexual Activity   Alcohol  use: Yes    Alcohol /week: 24.0 standard drinks of alcohol     Types: 24 Cans of  beer per week    Comment: 3-4 beers a week   Drug use: No   Sexual activity: Yes  Other Topics Concern   Not on file  Social History Narrative   Not on file   Social Drivers of Health   Financial Resource Strain: Low Risk  (11/26/2020)   Overall Financial Resource Strain (CARDIA)    Difficulty of Paying Living Expenses: Not hard at all  Food Insecurity: No Food Insecurity (02/17/2024)   Hunger Vital Sign    Worried About Running Out of Food in the Last Year: Never true    Ran Out of Food in the Last Year: Never  true  Transportation Needs: No Transportation Needs (02/17/2024)   PRAPARE - Administrator, Civil Service (Medical): No    Lack of Transportation (Non-Medical): No  Physical Activity: Inactive (11/26/2020)   Exercise Vital Sign    Days of Exercise per Week: 0 days    Minutes of Exercise per Session: 0 min  Stress: Stress Concern Present (11/26/2020)   [employer] of Occupational Health - Occupational Stress Questionnaire    Feeling of Stress : To some extent  Social Connections: Moderately Integrated (02/17/2024)   Social Connection and Isolation Panel [NHANES]    Frequency of Communication with Friends and Family: More than three times a week    Frequency of Social Gatherings with Friends and Family: Three times a week    Attends Religious Services: 1 to 4 times per year    Active Member of Clubs or Organizations: No    Attends Banker Meetings: Never    Marital Status: Married  Catering manager Violence: Not At Risk (02/17/2024)   Humiliation, Afraid, Rape, and Kick questionnaire    Fear of Current or Ex-Partner: No    Emotionally Abused: No    Physically Abused: No    Sexually Abused: No    Family History: Family History  Problem Relation Age of Onset   Stroke Mother    Cancer Maternal Aunt        cancer NOS; died in her 57s   Lung cancer Maternal Uncle        smoker    Current Medications:  Current Outpatient Medications:     acetaminophen  (TYLENOL ) 500 MG tablet, Take 1,000 mg by mouth every 8 (eight) hours as needed for mild pain (pain score 1-3) or moderate pain (pain score 4-6)., Disp: , Rfl:    acyclovir  (ZOVIRAX ) 800 MG tablet, TAKE 1 TABLET TWICE DAILY, Disp: 180 tablet, Rfl: 3   albuterol  (VENTOLIN  HFA) 108 (90 Base) MCG/ACT inhaler, Inhale 2 puffs into the lungs every 6 (six) hours as needed for wheezing or shortness of breath., Disp: , Rfl:    ALPRAZolam  (XANAX ) 0.5 MG tablet, TAKE 1 TABLET BY MOUTH AT BEDTIME AS NEEDED FOR anxiety / SLEEP, Disp: 30 tablet, Rfl: 5   amoxicillin -clavulanate (AUGMENTIN ) 875-125 MG tablet, Take 1 tablet by mouth 2 (two) times daily. Start on 03/16/24, Disp: 60 tablet, Rfl: 0   aspirin  EC 81 MG tablet, Take 81 mg by mouth daily. , Disp: , Rfl:    cetirizine  (ZYRTEC ) 10 MG tablet, Take 10 mg by mouth daily., Disp: , Rfl:    chlorhexidine  (PERIDEX ) 0.12 % solution, , Disp: , Rfl:    diphenhydrAMINE  (BENADRYL ) 12.5 MG/5ML elixir, Take 12.5 mg by mouth daily as needed for allergies., Disp: , Rfl:    folic acid  (FOLVITE ) 1 MG tablet, TAKE 1 TABLET BY MOUTH DAILY, Disp: 90 tablet, Rfl: 2   gabapentin  (NEURONTIN ) 300 MG capsule, Take 1 capsule (300 mg total) by mouth 3 (three) times daily., Disp: 180 capsule, Rfl: 2   Loperamide HCl (IMODIUM PO), Take 2 mg by mouth daily as needed (diarrhea)., Disp: , Rfl:    metoprolol  succinate (TOPROL -XL) 100 MG 24 hr tablet, Take 100 mg by mouth in the morning., Disp: , Rfl:    mupirocin  ointment (BACTROBAN ) 2 %, Apply 1 Application topically 3 (three) times daily., Disp: , Rfl:    ondansetron  (ZOFRAN ) 8 MG tablet, TAKE 1 TABLET BY MOUTH EVERY 8 HOURS AS NEEDED FOR NAUSEA AND VOMITING, Disp: 30 tablet, Rfl:  2   pantoprazole  (PROTONIX ) 40 MG tablet, TAKE 1 TABLET EVERY OTHER DAY, Disp: 45 tablet, Rfl: 3   polyethylene glycol (MIRALAX  / GLYCOLAX ) packet, Take 17 g by mouth daily as needed for mild constipation., Disp: 14 each, Rfl: 0   POMALYST  2 MG  capsule, Take by mouth., Disp: , Rfl:    sodium chloride  (OCEAN) 0.65 % SOLN nasal spray, Place 2 sprays into both nostrils 3 (three) times daily., Disp: 30 mL, Rfl: 1   tamoxifen  (NOLVADEX ) 20 MG tablet, TAKE 1 TABLET EVERY DAY, Disp: 90 tablet, Rfl: 3 No current facility-administered medications for this visit.  Facility-Administered Medications Ordered in Other Visits:    ondansetron  (ZOFRAN ) 8 mg in sodium chloride  0.9 % 50 mL IVPB, 8 mg, Intravenous, Once, Lockamy, Randi L, NP-C   sodium chloride  flush (NS) 0.9 % injection 10 mL, 10 mL, Intracatheter, Once PRN, Aidyn Kellis, MD   Allergies: Allergies  Allergen Reactions   Cefepime      Suspected severe thrombocytopenia is a result of cefepime  induced antigen platelet destruction    REVIEW OF SYSTEMS:   Review of Systems  Constitutional:  Negative for chills, fatigue and fever.  HENT:   Negative for lump/mass, mouth sores, nosebleeds, sore throat and trouble swallowing.   Eyes:  Negative for eye problems.  Respiratory:  Negative for cough and shortness of breath.   Cardiovascular:  Negative for chest pain, leg swelling and palpitations.  Gastrointestinal:  Negative for abdominal pain, constipation, diarrhea, nausea and vomiting.  Genitourinary:  Negative for bladder incontinence, difficulty urinating, dysuria, frequency, hematuria and nocturia.   Musculoskeletal:  Negative for arthralgias, back pain, flank pain, myalgias and neck pain.  Skin:  Negative for itching and rash.  Neurological:  Negative for dizziness.  Hematological:  Does not bruise/bleed easily.  Psychiatric/Behavioral:  Negative for depression, sleep disturbance and suicidal ideas. The patient is not nervous/anxious.   All other systems reviewed and are negative.    VITALS:   Blood pressure 131/62, pulse (!) 57, temperature (!) 96.3 F (35.7 C), temperature source Tympanic, resp. rate 20, weight 198 lb 6.6 oz (90 kg), SpO2 100%.  Wt Readings from Last 3  Encounters:  04/26/24 198 lb 6.6 oz (90 kg)  03/29/24 192 lb 14.4 oz (87.5 kg)  03/02/24 198 lb 3.1 oz (89.9 kg)    Body mass index is 29.3 kg/m.  Performance status (ECOG): 1 - Symptomatic but completely ambulatory  PHYSICAL EXAM:   Physical Exam Vitals and nursing note reviewed. Exam conducted with a chaperone present.  Constitutional:      Appearance: Normal appearance.  Cardiovascular:     Rate and Rhythm: Normal rate and regular rhythm.     Pulses: Normal pulses.     Heart sounds: Normal heart sounds.  Pulmonary:     Effort: Pulmonary effort is normal.     Breath sounds: Normal breath sounds.  Abdominal:     Palpations: Abdomen is soft. There is no hepatomegaly, splenomegaly or mass.     Tenderness: There is no abdominal tenderness.  Musculoskeletal:     Right lower leg: No edema.     Left lower leg: No edema.  Lymphadenopathy:     Cervical: No cervical adenopathy.     Right cervical: No superficial, deep or posterior cervical adenopathy.    Left cervical: No superficial, deep or posterior cervical adenopathy.     Upper Body:     Right upper body: No supraclavicular or axillary adenopathy.     Left  upper body: No supraclavicular or axillary adenopathy.  Neurological:     General: No focal deficit present.     Mental Status: He is alert and oriented to person, place, and time.  Psychiatric:        Mood and Affect: Mood normal.        Behavior: Behavior normal.     LABS:      Latest Ref Rng & Units 03/22/2024    1:15 PM 03/02/2024   10:38 AM 02/21/2024   10:36 AM  CBC  WBC 4.0 - 10.5 K/uL 5.5  4.9  3.3   Hemoglobin 13.0 - 17.0 g/dL 16.1  8.9  8.4   Hematocrit 39.0 - 52.0 % 32.2  28.4  25.9   Platelets 150 - 400 K/uL 203  232  178       Latest Ref Rng & Units 03/22/2024    1:15 PM 03/02/2024   10:38 AM 02/17/2024    5:02 AM  CMP  Glucose 70 - 99 mg/dL 96  97  096   BUN 8 - 23 mg/dL 12  11  20    Creatinine 0.61 - 1.24 mg/dL 0.45  4.09  8.11   Sodium 135 -  145 mmol/L 138  139  136   Potassium 3.5 - 5.1 mmol/L 4.5  4.4  4.4   Chloride 98 - 111 mmol/L 110  108  107   CO2 22 - 32 mmol/L 19  24  20    Calcium  8.9 - 10.3 mg/dL 7.8  8.3  7.6   Total Protein 6.5 - 8.1 g/dL 5.6  5.4    Total Bilirubin 0.0 - 1.2 mg/dL 0.6  0.5    Alkaline Phos 38 - 126 U/L 34  35    AST 15 - 41 U/L 19  16    ALT 0 - 44 U/L 14  10       No results found for: "CEA1", "CEA" / No results found for: "CEA1", "CEA" No results found for: "PSA1" No results found for: "CAN199" No results found for: "CAN125"  Lab Results  Component Value Date   TOTALPROTELP 5.1 (L) 03/22/2024   TOTALPROTELP 5.0 (L) 03/22/2024   ALBUMINELP 3.2 03/22/2024   A1GS 0.3 03/22/2024   A2GS 0.6 03/22/2024   BETS 0.8 03/22/2024   GAMS 0.2 (L) 03/22/2024   MSPIKE Not Observed 03/22/2024   SPEI Comment 03/22/2024   Lab Results  Component Value Date   TIBC 314 03/02/2023   TIBC 308 08/25/2022   TIBC 325 03/25/2020   FERRITIN 161 03/02/2023   FERRITIN 170 08/25/2022   FERRITIN 640 (H) 03/25/2020   IRONPCTSAT 11 (L) 03/02/2023   IRONPCTSAT 24 08/25/2022   IRONPCTSAT 10 (L) 03/25/2020   Lab Results  Component Value Date   LDH 131 11/18/2021   LDH 127 09/30/2021   LDH 102 08/05/2021     STUDIES:   No results found.

## 2024-04-26 NOTE — Patient Instructions (Signed)
 [facility] at [facility] Discharge Instructions   You were seen and examined today by Dr. Cheree Cords.  He reviewed the results of your lab work which are normal/stable.   We will start you back on treatment for the myeloma. Treatment will resume with your Darzalex  monthly injections. You will take the dexamethasone  (steroid) only on the days of the injection. We will also start you back on Pomalyst .   You will also need immunoglobulin injections once a month since yours are low and you are having frequent infections.   Return as scheduled.    Thank you for choosing [facility] at [facility] to provide your oncology and hematology care.  To afford each patient quality time with our provider, please arrive at least 15 minutes before your scheduled appointment time.   If you have a lab appointment with the Cancer Center please come in thru the Main Entrance and check in at the main information desk.  You need to re-schedule your appointment should you arrive 10 or more minutes late.  We strive to give you quality time with our providers, and arriving late affects you and other patients whose appointments are after yours.  Also, if you no show three or more times for appointments you may be dismissed from the clinic at the providers discretion.     Again, thank you for choosing [facility].  Our hope is that these requests will decrease the amount of time that you wait before being seen by our physicians.       _____________________________________________________________  Should you have questions after your visit to [facility], please contact our office at [phone] and follow the prompts.  Our office hours are 8:00 a.m. and 4:30 p.m. Monday - Friday.  Please note that voicemails left after 4:00 p.m. may not be returned until the following business day.  We are closed weekends and major holidays.  You do have access  to a nurse 24-7, just call the main number to the clinic [phone] and do not press any options, hold on the line and a nurse will answer the phone.    For prescription refill requests, have your pharmacy contact our office and allow 72 hours.    Due to Covid, you will need to wear a mask upon entering the hospital. If you do not have a mask, a mask will be given to you at the Main Entrance upon arrival. For doctor visits, patients may have 1 support person age 68 or older with them. For treatment visits, patients can not have anyone with them due to social distancing guidelines and our immunocompromised population.

## 2024-04-27 ENCOUNTER — Other Ambulatory Visit: Payer: Self-pay

## 2024-04-27 ENCOUNTER — Inpatient Hospital Stay: Attending: Hematology

## 2024-04-27 ENCOUNTER — Inpatient Hospital Stay

## 2024-04-27 VITALS — BP 137/82 | HR 57 | Temp 97.4°F | Resp 18

## 2024-04-27 DIAGNOSIS — I129 Hypertensive chronic kidney disease with stage 1 through stage 4 chronic kidney disease, or unspecified chronic kidney disease: Secondary | ICD-10-CM | POA: Diagnosis not present

## 2024-04-27 DIAGNOSIS — Z79624 Long term (current) use of inhibitors of nucleotide synthesis: Secondary | ICD-10-CM | POA: Insufficient documentation

## 2024-04-27 DIAGNOSIS — D801 Nonfamilial hypogammaglobulinemia: Secondary | ICD-10-CM | POA: Diagnosis not present

## 2024-04-27 DIAGNOSIS — G629 Polyneuropathy, unspecified: Secondary | ICD-10-CM | POA: Diagnosis not present

## 2024-04-27 DIAGNOSIS — Z9484 Stem cells transplant status: Secondary | ICD-10-CM | POA: Diagnosis not present

## 2024-04-27 DIAGNOSIS — Z79810 Long term (current) use of selective estrogen receptor modulators (SERMs): Secondary | ICD-10-CM | POA: Insufficient documentation

## 2024-04-27 DIAGNOSIS — Z5112 Encounter for antineoplastic immunotherapy: Secondary | ICD-10-CM | POA: Diagnosis not present

## 2024-04-27 DIAGNOSIS — C7951 Secondary malignant neoplasm of bone: Secondary | ICD-10-CM

## 2024-04-27 DIAGNOSIS — Z7961 Long term (current) use of immunomodulator: Secondary | ICD-10-CM | POA: Diagnosis not present

## 2024-04-27 DIAGNOSIS — Z853 Personal history of malignant neoplasm of breast: Secondary | ICD-10-CM | POA: Insufficient documentation

## 2024-04-27 DIAGNOSIS — C9000 Multiple myeloma not having achieved remission: Secondary | ICD-10-CM

## 2024-04-27 DIAGNOSIS — Z170 Estrogen receptor positive status [ER+]: Secondary | ICD-10-CM

## 2024-04-27 DIAGNOSIS — D508 Other iron deficiency anemias: Secondary | ICD-10-CM

## 2024-04-27 DIAGNOSIS — D702 Other drug-induced agranulocytosis: Secondary | ICD-10-CM

## 2024-04-27 DIAGNOSIS — C9002 Multiple myeloma in relapse: Secondary | ICD-10-CM | POA: Diagnosis not present

## 2024-04-27 DIAGNOSIS — E611 Iron deficiency: Secondary | ICD-10-CM | POA: Insufficient documentation

## 2024-04-27 LAB — CBC WITH DIFFERENTIAL/PLATELET
Abs Immature Granulocytes: 0.02 10*3/uL (ref 0.00–0.07)
Basophils Absolute: 0 10*3/uL (ref 0.0–0.1)
Basophils Relative: 1 %
Eosinophils Absolute: 0.3 10*3/uL (ref 0.0–0.5)
Eosinophils Relative: 4 %
HCT: 33.5 % — ABNORMAL LOW (ref 39.0–52.0)
Hemoglobin: 10.7 g/dL — ABNORMAL LOW (ref 13.0–17.0)
Immature Granulocytes: 0 %
Lymphocytes Relative: 19 %
Lymphs Abs: 1.4 10*3/uL (ref 0.7–4.0)
MCH: 33.9 pg (ref 26.0–34.0)
MCHC: 31.9 g/dL (ref 30.0–36.0)
MCV: 106 fL — ABNORMAL HIGH (ref 80.0–100.0)
Monocytes Absolute: 0.7 10*3/uL (ref 0.1–1.0)
Monocytes Relative: 9 %
Neutro Abs: 4.8 10*3/uL (ref 1.7–7.7)
Neutrophils Relative %: 67 %
Platelets: 147 10*3/uL — ABNORMAL LOW (ref 150–400)
RBC: 3.16 MIL/uL — ABNORMAL LOW (ref 4.22–5.81)
RDW: 14.9 % (ref 11.5–15.5)
WBC: 7.3 10*3/uL (ref 4.0–10.5)
nRBC: 0 % (ref 0.0–0.2)

## 2024-04-27 LAB — COMPREHENSIVE METABOLIC PANEL WITH GFR
ALT: 11 U/L (ref 0–44)
AST: 17 U/L (ref 15–41)
Albumin: 3.4 g/dL — ABNORMAL LOW (ref 3.5–5.0)
Alkaline Phosphatase: 34 U/L — ABNORMAL LOW (ref 38–126)
Anion gap: 8 (ref 5–15)
BUN: 16 mg/dL (ref 8–23)
CO2: 24 mmol/L (ref 22–32)
Calcium: 8.3 mg/dL — ABNORMAL LOW (ref 8.9–10.3)
Chloride: 107 mmol/L (ref 98–111)
Creatinine, Ser: 1.3 mg/dL — ABNORMAL HIGH (ref 0.61–1.24)
GFR, Estimated: 57 mL/min — ABNORMAL LOW (ref >60)
Glucose, Bld: 95 mg/dL (ref 70–99)
Potassium: 4.1 mmol/L (ref 3.5–5.1)
Sodium: 139 mmol/L (ref 135–145)
Total Bilirubin: 0.6 mg/dL (ref 0.0–1.2)
Total Protein: 5.2 g/dL — ABNORMAL LOW (ref 6.5–8.1)

## 2024-04-27 LAB — MAGNESIUM: Magnesium: 2 mg/dL (ref 1.7–2.4)

## 2024-04-27 MED ORDER — SODIUM CHLORIDE 0.9% FLUSH
10.0000 mL | Freq: Once | INTRAVENOUS | Status: AC
  Administered 2024-04-27: 10 mL

## 2024-04-27 MED ORDER — HEPARIN SOD (PORK) LOCK FLUSH 100 UNIT/ML IV SOLN
500.0000 [IU] | Freq: Once | INTRAVENOUS | Status: AC
  Administered 2024-04-27: 500 [IU] via INTRAVENOUS

## 2024-04-27 MED ORDER — DARATUMUMAB-HYALURONIDASE-FIHJ 1800-30000 MG-UT/15ML ~~LOC~~ SOLN
1800.0000 mg | Freq: Once | SUBCUTANEOUS | Status: AC
Start: 2024-04-27 — End: 2024-04-27
  Administered 2024-04-27: 1800 mg via SUBCUTANEOUS
  Filled 2024-04-27: qty 15

## 2024-04-27 MED ORDER — DEXAMETHASONE 4 MG PO TABS
20.0000 mg | ORAL_TABLET | Freq: Once | ORAL | Status: AC
  Administered 2024-04-27: 20 mg via ORAL
  Filled 2024-04-27: qty 5

## 2024-04-27 MED ORDER — SODIUM CHLORIDE 0.9% FLUSH
10.0000 mL | INTRAVENOUS | Status: DC | PRN
  Administered 2024-04-27: 10 mL via INTRAVENOUS

## 2024-04-27 MED ORDER — DEXTROSE 5 % IV SOLN: INTRAVENOUS | Status: DC

## 2024-04-27 MED ORDER — IMMUNE GLOBULIN (HUMAN) 10 GM/100ML IV SOLN
40.0000 g | Freq: Once | INTRAVENOUS | Status: AC
Start: 2024-04-27 — End: 2024-04-27
  Administered 2024-04-27: 40 g via INTRAVENOUS
  Filled 2024-04-27: qty 400

## 2024-04-27 MED ORDER — CETIRIZINE HCL 10 MG/ML IV SOLN
10.0000 mg | Freq: Once | INTRAVENOUS | Status: AC
  Administered 2024-04-27: 10 mg via INTRAVENOUS
  Filled 2024-04-27: qty 1

## 2024-04-27 MED ORDER — ACETAMINOPHEN 325 MG PO TABS
650.0000 mg | ORAL_TABLET | Freq: Once | ORAL | Status: AC
Start: 2024-04-27 — End: 2024-04-27
  Administered 2024-04-27: 650 mg via ORAL
  Filled 2024-04-27: qty 2

## 2024-04-27 MED ORDER — FAMOTIDINE IN NACL 20-0.9 MG/50ML-% IV SOLN
20.0000 mg | Freq: Once | INTRAVENOUS | Status: AC
  Administered 2024-04-27: 20 mg via INTRAVENOUS
  Filled 2024-04-27: qty 50

## 2024-04-27 NOTE — Patient Instructions (Signed)
 [facility] - A DEPT OF [facility].  HOSPITAL  Discharge Instructions: Thank you for choosing [facility] to provide your oncology and hematology care.  If you have a lab appointment with the Cancer Center - please note that after April 8th, 2024, all labs will be drawn in the cancer center.  You do not have to check in or register with the main entrance as you have in the past but will complete your check-in in the cancer center.  Wear comfortable clothing and clothing appropriate for easy access to any Portacath or PICC line.   We strive to give you quality time with your provider. You may need to reschedule your appointment if you arrive late (15 or more minutes).  Arriving late affects you and other patients whose appointments are after yours.  Also, if you miss three or more appointments without notifying the office, you may be dismissed from the clinic at the provider's discretion.      For prescription refill requests, have your pharmacy contact our office and allow 72 hours for refills to be completed.    Today you received the following chemotherapy and/or immunotherapy agents IVIG and Darzalex  Faspro.  Daratumumab ; Hyaluronidase  Injection What is this medication? DARATUMUMAB ; HYALURONIDASE  (dar a toom ue mab; hye al ur ON i dase) treats multiple myeloma, a type of bone marrow cancer. Daratumumab  works by blocking a protein that causes cancer cells to grow and multiply. This helps to slow or stop the spread of cancer cells. Hyaluronidase  works by increasing the absorption of other medications in the body to help them work better. This medication may also be used treat amyloidosis, a condition that causes the buildup of a protein (amyloid) in your body. It works by reducing the buildup of this protein, which decreases symptoms. It is a combination medication that contains a monoclonal antibody. This medicine may be used for other purposes; ask your health  care provider or pharmacist if you have questions. COMMON BRAND NAME(S): DARZALEX  FASPRO What should I tell my care team before I take this medication? They need to know if you have any of these conditions: Heart disease Infection, such as chickenpox, cold sores, herpes, hepatitis B Lung or breathing disease An unusual or allergic reaction to daratumumab , hyaluronidase , other medications, foods, dyes, or preservatives Pregnant or trying to get pregnant Breast-feeding How should I use this medication? This medication is injected under the skin. It is given by your care team in a hospital or clinic setting. Talk to your care team about the use of this medication in children. Special care may be needed. Overdosage: If you think you have taken too much of this medicine contact a poison control center or emergency room at once. NOTE: This medicine is only for you. Do not share this medicine with others. What if I miss a dose? Keep appointments for follow-up doses. It is important not to miss your dose. Call your care team if you are unable to keep an appointment. What may interact with this medication? Interactions have not been studied. This list may not describe all possible interactions. Give your health care provider a list of all the medicines, herbs, non-prescription drugs, or dietary supplements you use. Also tell them if you smoke, drink alcohol , or use illegal drugs. Some items may interact with your medicine. What should I watch for while using this medication? Your condition will be monitored carefully while you are receiving this medication. This medication can  cause serious allergic reactions. To reduce your risk, your care team may give you other medication to take before receiving this one. Be sure to follow the directions from your care team. This medication can affect the results of blood tests to match your blood type. These changes can last for up to 6 months after the final  dose. Your care team will do blood tests to match your blood type before you start treatment. Tell all of your care team that you are being treated with this medication before receiving a blood transfusion. This medication can affect the results of some tests used to determine treatment response; extra tests may be needed to evaluate response. Talk to your care team if you wish to become pregnant or think you are pregnant. This medication can cause serious birth defects if taken during pregnancy and for 3 months after the last dose. A reliable form of contraception is recommended while taking this medication and for 3 months after the last dose. Talk to your care team about effective forms of contraception. Do not breast-feed while taking this medication. What side effects may I notice from receiving this medication? Side effects that you should report to your care team as soon as possible: Allergic reactions--skin rash, itching, hives, swelling of the face, lips, tongue, or throat Heart rhythm changes--fast or irregular heartbeat, dizziness, feeling faint or lightheaded, chest pain, trouble breathing Infection--fever, chills, cough, sore throat, wounds that don't heal, pain or trouble when passing urine, general feeling of discomfort or being unwell Infusion reactions--chest pain, shortness of breath or trouble breathing, feeling faint or lightheaded Sudden eye pain or change in vision such as blurry vision, seeing halos around lights, vision loss Unusual bruising or bleeding Side effects that usually do not require medical attention (report to your care team if they continue or are bothersome): Constipation Diarrhea Fatigue Nausea Pain, tingling, or numbness in the hands or feet Swelling of the ankles, hands, or feet This list may not describe all possible side effects. Call your doctor for medical advice about side effects. You may report side effects to FDA at 1-800-FDA-1088. Where should I  keep my medication? This medication is given in a hospital or clinic. It will not be stored at home. NOTE: This sheet is a summary. It may not cover all possible information. If you have questions about this medicine, talk to your doctor, pharmacist, or health care provider.  2024 Elsevier/Gold Standard (2022-04-21 00:00:00)Immune Globulin  Injection What is this medication? IMMUNE GLOBULIN  (im MUNE GLOB yoo lin) treats many immune system conditions. It works by Designer, multimedia extra antibodies. Antibodies are proteins made by the immune system that help protect the body. This medicine may be used for other purposes; ask your health care provider or pharmacist if you have questions. COMMON BRAND NAME(S): ASCENIV, Baygam, BIVIGAM, Carimune, Carimune NF, cutaquig, Cuvitru, Flebogamma, Flebogamma DIF, GamaSTAN, GamaSTAN S/D, Gamimune N, Gammagard, Gammagard S/D, Gammaked, Gammaplex, Gammar-P IV, Gamunex, Gamunex-C, Hizentra, Iveegam, Iveegam EN, Octagam, Panglobulin, Panglobulin NF, panzyga, Polygam S/D, Privigen , Sandoglobulin, Venoglobulin-S, Vigam, Vivaglobulin, Xembify What should I tell my care team before I take this medication? They need to know if you have any of these conditions: Blood clotting disorder Condition where you have excess fluid in your body, such as heart failure or edema Dehydration Diabetes Have had blood clots Heart disease Immune system conditions Kidney disease Low levels of IgA Recent or upcoming vaccine An unusual or allergic reaction to immune globulin , other medications, foods, dyes, or preservatives  Pregnant or trying to get pregnant Breastfeeding How should I use this medication? This medication is infused into a vein or under the skin. It is usually given by your care team in a hospital or clinic setting. It may also be given at home. If you get this medication at home, you will be taught how to prepare and give it. Use exactly as directed. Take it as directed  on the prescription label at the same time every day. Keep taking it unless your care team tells you to stop. It is important that you put your used needles and syringes in a special sharps container. Do not put them in a trash can. If you do not have a sharps container, call your pharmacist or care team to get one. Talk to your care team about the use of this medication in children. While it may be given to children for selected conditions, precautions do apply. Overdosage: If you think you have taken too much of this medicine contact a poison control center or emergency room at once. NOTE: This medicine is only for you. Do not share this medicine with others. What if I miss a dose? If you get this medication at the hospital or clinic: It is important not to miss your dose. Call your care team if you are unable to keep an appointment. If you give yourself this medication at home: If you miss a dose, take it as soon as you can. Then continue your normal schedule. If it is almost time for your next dose, take only that dose. Do not take double or extra doses. Call your care team with questions. What may interact with this medication? Live virus vaccines This list may not describe all possible interactions. Give your health care provider a list of all the medicines, herbs, non-prescription drugs, or dietary supplements you use. Also tell them if you smoke, drink alcohol , or use illegal drugs. Some items may interact with your medicine. What should I watch for while using this medication? Your condition will be monitored carefully while you are receiving this medication. Tell your care team if your symptoms do not start to get better or if they get worse. You may need blood work done while you are taking this medication. This medication increases the risk of blood clots. People with heart, blood vessel, or blood clotting conditions are more likely to develop a blood clot. Other risk factors include  advanced age, estrogen use, tobacco use, lack of movement, and being overweight. This medication can decrease the response to a vaccine. If you need to get vaccinated, tell your care team if you have received this medication within the last year. Extra booster doses may be needed. Talk to your care team to see if a different vaccination schedule is needed. If you have diabetes, you may get a falsely elevated blood sugar reading. Talk to your care team about how to check your blood sugar while taking this medication. What side effects may I notice from receiving this medication? Side effects that you should report to your care team as soon as possible: Allergic reactions--skin rash, itching, hives, swelling of the face, lips, tongue, or throat Blood clot--pain, swelling, or warmth in the leg, shortness of breath, chest pain Fever, neck pain or stiffness, sensitivity to light, headache, nausea, vomiting, confusion, which may be signs of meningitis Hemolytic anemia--unusual weakness or fatigue, dizziness, headache, trouble breathing, dark urine, yellowing skin or eyes Kidney injury--decrease in the amount of urine, swelling of  the ankles, hands, or feet Low sodium level--muscle weakness, fatigue, dizziness, headache, confusion Shortness of breath or trouble breathing, cough, unusual weakness or fatigue, blue skin or lips Side effects that usually do not require medical attention (report these to your care team if they continue or are bothersome): Chills Diarrhea Fever Headache Nausea This list may not describe all possible side effects. Call your doctor for medical advice about side effects. You may report side effects to FDA at 1-800-FDA-1088. Where should I keep my medication? Keep out of the reach of children and pets. You will be instructed on how to store this medication. Get rid of any unused medication after the expiration date. To get rid of medications that are no longer needed or have  expired: Take the medication to a medication take-back program. Check with your pharmacy or law enforcement to find a location. If you cannot return the medication, ask your pharmacist or care team how to get rid of this medication safely. NOTE: This sheet is a summary. It may not cover all possible information. If you have questions about this medicine, talk to your doctor, pharmacist, or health care provider.  2024 Elsevier/Gold Standard (2023-11-26 00:00:00)      To help prevent nausea and vomiting after your treatment, we encourage you to take your nausea medication as directed.  BELOW ARE SYMPTOMS THAT SHOULD BE REPORTED IMMEDIATELY: *FEVER GREATER THAN 100.4 F (38 C) OR HIGHER *CHILLS OR SWEATING *NAUSEA AND VOMITING THAT IS NOT CONTROLLED WITH YOUR NAUSEA MEDICATION *UNUSUAL SHORTNESS OF BREATH *UNUSUAL BRUISING OR BLEEDING *URINARY PROBLEMS (pain or burning when urinating, or frequent urination) *BOWEL PROBLEMS (unusual diarrhea, constipation, pain near the anus) TENDERNESS IN MOUTH AND THROAT WITH OR WITHOUT PRESENCE OF ULCERS (sore throat, sores in mouth, or a toothache) UNUSUAL RASH, SWELLING OR PAIN  UNUSUAL VAGINAL DISCHARGE OR ITCHING   Items with * indicate a potential emergency and should be followed up as soon as possible or go to the Emergency Department if any problems should occur.  Please show the CHEMOTHERAPY ALERT CARD or IMMUNOTHERAPY ALERT CARD at check-in to the Emergency Department and triage nurse.  Should you have questions after your visit or need to cancel or reschedule your appointment, please contact [facility] [place] - A DEPT OF [facility] [phone]  and follow the prompts.  Office hours are 8:00 a.m. to 4:30 p.m. Monday - Friday. Please note that voicemails left after 4:00 p.m. may not be returned until the following business day.  We are closed weekends and major holidays. You have access to a nurse at all times for urgent  questions. Please call the main number to the clinic [phone] and follow the prompts.  For any non-urgent questions, you may also contact your provider using MyChart. We now offer e-Visits for anyone 7 and older to request care online for non-urgent symptoms. For details visit [email or web address].   Also download the MyChart app! Go to the app store, search "MyChart", open the app, select [facility], and log in with your MyChart username and password.

## 2024-04-27 NOTE — Progress Notes (Signed)
 Patient presents today for first IVIG infusion and Darzalex  Faspro injection. Patient has no complaints or concerns noted today. Labs pending.   Treatment given today per MD orders. Tolerated infusion without adverse affects. Vital signs stable. No complaints at this time. Discharged from clinic ambulatory in stable condition. Alert and oriented x 3. F/U with [facility] as scheduled.

## 2024-04-28 ENCOUNTER — Other Ambulatory Visit: Payer: Self-pay | Admitting: Otolaryngology

## 2024-05-02 ENCOUNTER — Telehealth: Payer: Self-pay | Admitting: Internal Medicine

## 2024-05-02 NOTE — Telephone Encounter (Signed)
 Correll called to cancel his upcoming appointment due to an operation. Pt stated if Dr. Levern Reader and Dr. Ralston Burkes see a need to follow-up please contact patient for scheduling.

## 2024-05-03 ENCOUNTER — Other Ambulatory Visit: Payer: Self-pay

## 2024-05-03 ENCOUNTER — Encounter (HOSPITAL_COMMUNITY): Payer: Self-pay | Admitting: Otolaryngology

## 2024-05-03 NOTE — Progress Notes (Signed)
 SDW call  Patient was given pre-op instructions over the phone. Patient verbalized understanding of instructions provided.     PCP - Dr. Broadus Canes, [place] Hematology: Dr. Paulett Boros Cardiologist -  Pulmonary:    PPM/ICD - denies Device Orders - na Rep Notified - na   Chest x-ray - 12/29/2022 EKG -  DOS, 5/9/20205 Stress Test - ECHO -  Cardiac Cath -   Sleep Study/sleep apnea/CPAP: denies  Non-diabetic  Blood Thinner Instructions: Denies  Aspirin  Instructions: states on hold, last dose 04/25/2023   ERAS Protcol - Clears until 0430   Anesthesia review: Yes, HTN, multiple myeloma   Patient denies shortness of breath, fever, cough and chest pain over the phone call  Your procedure is scheduled on Friday May 05, 2024  Report to [facility] Main Entrance "A" at 0530   A.M., then check in with the Admitting office.  Call this number if you have problems the morning of surgery:  [phone]   If you have any questions prior to your surgery date call [phone]: Open Monday-Friday 8am-4pm If you experience any cold or flu symptoms such as cough, fever, chills, shortness of breath, etc. between now and your scheduled surgery, please notify us  at the above number     Remember:  Do not eat after midnight the night before your surgery  You may drink clear liquids until 0430    the morning of your surgery.   Clear liquids allowed are: Water , Non-Citrus Juices (without pulp), Carbonated Beverages, Clear Tea, Black Coffee ONLY (NO MILK, CREAM OR POWDERED CREAMER of any kind), and Gatorade   Take these medicines the morning of surgery with A SIP OF WATER :  Acyclovir , zyrtec , gabapentin , metoprolol , protonix   As needed: Tylenol , albuterol   As of today, STOP taking any Aleve, Naproxen, Ibuprofen , Motrin , Advil , Goody's, BC's, all herbal medications, fish oil, and all vitamins.

## 2024-05-04 ENCOUNTER — Other Ambulatory Visit: Payer: Self-pay

## 2024-05-04 ENCOUNTER — Ambulatory Visit: Admitting: Internal Medicine

## 2024-05-04 NOTE — Anesthesia Preprocedure Evaluation (Addendum)
 Anesthesia Evaluation  Patient identified by MRN, date of birth, ID band Patient awake    Reviewed: Allergy & Precautions, NPO status , Patient's Chart, lab work & pertinent test results  Airway Mallampati: II  TM Distance: >3 FB Neck ROM: Full    Dental  (+) Teeth Intact, Dental Advisory Given, Missing   Pulmonary neg pulmonary ROS   Pulmonary exam normal breath sounds clear to auscultation       Cardiovascular hypertension, Pt. on home beta blockers (-) angina (-) Past MI Normal cardiovascular exam Rhythm:Regular Rate:Normal     Neuro/Psych  Headaches PSYCHIATRIC DISORDERS Anxiety     TIA Neuromuscular disease    GI/Hepatic Neg liver ROS,GERD  Medicated,,  Endo/Other  negative endocrine ROS    Renal/GU Renal InsufficiencyRenal disease     Musculoskeletal  (+) Arthritis ,    Abdominal   Peds  Hematology  (+) Blood dyscrasia, anemia Multiple myeloma    Anesthesia Other Findings  Oroantral fistula; Chronic osteomyelitis of maxilla; Right facial numbness; Chronic maxillary sinusitis  Stage IV breast cancer; radiation and tamoxifen   Reproductive/Obstetrics                              Anesthesia Physical Anesthesia Plan  ASA: 3  Anesthesia Plan: General   Post-op Pain Management: Tylenol  PO (pre-op)*   Induction: Intravenous  PONV Risk Score and Plan: 3 and Dexamethasone , Ondansetron  and Treatment may vary due to age or medical condition  Airway Management Planned: Oral ETT  Additional Equipment:   Intra-op Plan:   Post-operative Plan: Extubation in OR  Informed Consent: I have reviewed the patients History and Physical, chart, labs and discussed the procedure including the risks, benefits and alternatives for the proposed anesthesia with the patient or authorized representative who has indicated his/her understanding and acceptance.     Dental advisory given  Plan  Discussed with: CRNA  Anesthesia Plan Comments:          Anesthesia Quick Evaluation

## 2024-05-05 ENCOUNTER — Encounter (HOSPITAL_COMMUNITY): Payer: Self-pay | Admitting: Otolaryngology

## 2024-05-05 ENCOUNTER — Encounter (HOSPITAL_COMMUNITY)
Admission: RE | Disposition: A | Discharge status: Home / Self Care | Payer: Self-pay | Source: Home / Self Care | Attending: Otolaryngology

## 2024-05-05 ENCOUNTER — Other Ambulatory Visit: Payer: Self-pay

## 2024-05-05 ENCOUNTER — Ambulatory Visit (HOSPITAL_COMMUNITY)
Admission: RE | Admit: 2024-05-05 | Discharge: 2024-05-05 | Disposition: A | Discharge status: Home / Self Care | Attending: Otolaryngology | Admitting: Otolaryngology

## 2024-05-05 ENCOUNTER — Ambulatory Visit (HOSPITAL_BASED_OUTPATIENT_CLINIC_OR_DEPARTMENT_OTHER): Payer: Self-pay | Admitting: Physician Assistant

## 2024-05-05 ENCOUNTER — Ambulatory Visit (HOSPITAL_COMMUNITY): Payer: Self-pay | Admitting: Physician Assistant

## 2024-05-05 DIAGNOSIS — M272 Inflammatory conditions of jaws: Secondary | ICD-10-CM | POA: Diagnosis not present

## 2024-05-05 DIAGNOSIS — J320 Chronic maxillary sinusitis: Secondary | ICD-10-CM

## 2024-05-05 DIAGNOSIS — C9000 Multiple myeloma not having achieved remission: Secondary | ICD-10-CM | POA: Insufficient documentation

## 2024-05-05 DIAGNOSIS — K219 Gastro-esophageal reflux disease without esophagitis: Secondary | ICD-10-CM | POA: Diagnosis not present

## 2024-05-05 DIAGNOSIS — I10 Essential (primary) hypertension: Secondary | ICD-10-CM | POA: Insufficient documentation

## 2024-05-05 DIAGNOSIS — Z79899 Other long term (current) drug therapy: Secondary | ICD-10-CM | POA: Insufficient documentation

## 2024-05-05 DIAGNOSIS — R200 Anesthesia of skin: Secondary | ICD-10-CM | POA: Diagnosis not present

## 2024-05-05 DIAGNOSIS — F419 Anxiety disorder, unspecified: Secondary | ICD-10-CM | POA: Diagnosis not present

## 2024-05-05 HISTORY — PX: SKIN FULL THICKNESS GRAFT: SHX442

## 2024-05-05 HISTORY — PX: CLOSURE ORAL ANTRAL FISTULA: SHX6286

## 2024-05-05 SURGERY — CLOSURE, FISTULA, OROANTRAL
Anesthesia: General | Site: Mouth | Laterality: Right

## 2024-05-05 MED ORDER — SUGAMMADEX SODIUM 200 MG/2ML IV SOLN
INTRAVENOUS | Status: DC | PRN
  Administered 2024-05-05: 200 mg via INTRAVENOUS

## 2024-05-05 MED ORDER — ROCURONIUM BROMIDE 10 MG/ML (PF) SYRINGE
PREFILLED_SYRINGE | INTRAVENOUS | Status: DC | PRN
  Administered 2024-05-05: 50 mg via INTRAVENOUS
  Administered 2024-05-05: 10 mg via INTRAVENOUS
  Administered 2024-05-05: 5 mg via INTRAVENOUS
  Administered 2024-05-05: 10 mg via INTRAVENOUS

## 2024-05-05 MED ORDER — MIDAZOLAM HCL 2 MG/2ML IJ SOLN
INTRAMUSCULAR | Status: AC
  Filled 2024-05-05: qty 2

## 2024-05-05 MED ORDER — PROPOFOL 10 MG/ML IV BOLUS
INTRAVENOUS | Status: AC
  Filled 2024-05-05: qty 20

## 2024-05-05 MED ORDER — CEFAZOLIN SODIUM-DEXTROSE 2-3 GM-%(50ML) IV SOLR
INTRAVENOUS | Status: DC | PRN
Start: 2024-05-05 — End: 2024-05-05
  Administered 2024-05-05: 2 g via INTRAVENOUS

## 2024-05-05 MED ORDER — SODIUM CHLORIDE (PF) 0.9 % IJ SOLN
INTRAMUSCULAR | Status: AC
  Filled 2024-05-05: qty 10

## 2024-05-05 MED ORDER — FENTANYL CITRATE (PF) 250 MCG/5ML IJ SOLN
INTRAMUSCULAR | Status: DC | PRN
  Administered 2024-05-05: 25 ug via INTRAVENOUS
  Administered 2024-05-05: 50 ug via INTRAVENOUS

## 2024-05-05 MED ORDER — DEXAMETHASONE SODIUM PHOSPHATE 10 MG/ML IJ SOLN
INTRAMUSCULAR | Status: AC
  Filled 2024-05-05: qty 1

## 2024-05-05 MED ORDER — CEFAZOLIN SODIUM 1 G IJ SOLR
INTRAMUSCULAR | Status: AC
  Filled 2024-05-05: qty 20

## 2024-05-05 MED ORDER — LIDOCAINE 2% (20 MG/ML) 5 ML SYRINGE
INTRAMUSCULAR | Status: AC
  Filled 2024-05-05: qty 5

## 2024-05-05 MED ORDER — FENTANYL CITRATE (PF) 250 MCG/5ML IJ SOLN
INTRAMUSCULAR | Status: AC
  Filled 2024-05-05: qty 5

## 2024-05-05 MED ORDER — PHENYLEPHRINE HCL-NACL 20-0.9 MG/250ML-% IV SOLN
INTRAVENOUS | Status: DC | PRN
  Administered 2024-05-05: 30 ug/min via INTRAVENOUS

## 2024-05-05 MED ORDER — PROPOFOL 10 MG/ML IV BOLUS
INTRAVENOUS | Status: DC | PRN
  Administered 2024-05-05: 130 mg via INTRAVENOUS

## 2024-05-05 MED ORDER — 0.9 % SODIUM CHLORIDE (POUR BTL) OPTIME
TOPICAL | Status: DC | PRN
  Administered 2024-05-05: 1000 mL

## 2024-05-05 MED ORDER — LACTATED RINGERS IV SOLN: INTRAVENOUS | Status: DC | PRN

## 2024-05-05 MED ORDER — CHLORHEXIDINE GLUCONATE 0.12 % MT SOLN
15.0000 mL | Freq: Once | OROMUCOSAL | Status: AC
  Administered 2024-05-05: 15 mL via OROMUCOSAL
  Filled 2024-05-05: qty 15

## 2024-05-05 MED ORDER — BACITRACIN ZINC 500 UNIT/GM EX OINT
TOPICAL_OINTMENT | CUTANEOUS | Status: AC
Start: 2024-05-05 — End: ?
  Filled 2024-05-05: qty 28.35

## 2024-05-05 MED ORDER — ORAL CARE MOUTH RINSE: 15.0000 mL | Freq: Once | OROMUCOSAL | Status: AC

## 2024-05-05 MED ORDER — ACETAMINOPHEN 500 MG PO TABS
1000.0000 mg | ORAL_TABLET | Freq: Once | ORAL | Status: AC
  Administered 2024-05-05: 1000 mg via ORAL
  Filled 2024-05-05: qty 2

## 2024-05-05 MED ORDER — CHLORHEXIDINE GLUCONATE 0.12 % MT SOLN: OROMUCOSAL | 1 refills | Status: AC

## 2024-05-05 MED ORDER — FENTANYL CITRATE (PF) 100 MCG/2ML IJ SOLN: 25.0000 ug | INTRAMUSCULAR | Status: DC | PRN

## 2024-05-05 MED ORDER — LACTATED RINGERS IV SOLN: INTRAVENOUS | Status: DC

## 2024-05-05 MED ORDER — ROCURONIUM BROMIDE 10 MG/ML (PF) SYRINGE: PREFILLED_SYRINGE | INTRAVENOUS | Status: DC | PRN

## 2024-05-05 MED ORDER — MIDAZOLAM HCL 2 MG/2ML IJ SOLN
INTRAMUSCULAR | Status: DC | PRN
  Administered 2024-05-05: 1 mg via INTRAVENOUS

## 2024-05-05 MED ORDER — LIDOCAINE 2% (20 MG/ML) 5 ML SYRINGE
INTRAMUSCULAR | Status: DC | PRN
  Administered 2024-05-05: 60 mg via INTRAVENOUS

## 2024-05-05 MED ORDER — EPHEDRINE SULFATE (PRESSORS) 50 MG/ML IJ SOLN
INTRAMUSCULAR | Status: DC | PRN
Start: 2024-05-05 — End: 2024-05-05
  Administered 2024-05-05: 5 mg via INTRAVENOUS
  Administered 2024-05-05: 10 mg via INTRAVENOUS
  Administered 2024-05-05: 5 mg via INTRAVENOUS

## 2024-05-05 MED ORDER — LIDOCAINE 2% (20 MG/ML) 5 ML SYRINGE: INTRAMUSCULAR | Status: DC | PRN

## 2024-05-05 MED ORDER — ONDANSETRON HCL 4 MG/2ML IJ SOLN: 4.0000 mg | Freq: Once | INTRAMUSCULAR | Status: DC | PRN

## 2024-05-05 MED ORDER — ONDANSETRON HCL 4 MG/2ML IJ SOLN
INTRAMUSCULAR | Status: AC
  Filled 2024-05-05: qty 2

## 2024-05-05 MED ORDER — GLYCOPYRROLATE PF 0.2 MG/ML IJ SOSY
PREFILLED_SYRINGE | INTRAMUSCULAR | Status: AC
  Filled 2024-05-05: qty 1

## 2024-05-05 MED ORDER — EPHEDRINE 5 MG/ML INJ
INTRAVENOUS | Status: AC
  Filled 2024-05-05: qty 5

## 2024-05-05 MED ORDER — LIDOCAINE-EPINEPHRINE 1 %-1:100000 IJ SOLN
INTRAMUSCULAR | Status: AC
  Filled 2024-05-05: qty 1

## 2024-05-05 MED ORDER — ONDANSETRON HCL 4 MG/2ML IJ SOLN
INTRAMUSCULAR | Status: DC | PRN
  Administered 2024-05-05: 4 mg via INTRAVENOUS

## 2024-05-05 MED ORDER — ROCURONIUM BROMIDE 10 MG/ML (PF) SYRINGE
PREFILLED_SYRINGE | INTRAVENOUS | Status: AC
  Filled 2024-05-05: qty 10

## 2024-05-05 MED ORDER — LIDOCAINE-EPINEPHRINE 1 %-1:100000 IJ SOLN
INTRAMUSCULAR | Status: DC | PRN
  Administered 2024-05-05: 6 mL

## 2024-05-05 MED ORDER — AMOXICILLIN-POT CLAVULANATE 875-125 MG PO TABS: 1.0000 | ORAL_TABLET | Freq: Two times a day (BID) | ORAL | 0 refills | Status: AC

## 2024-05-05 MED ORDER — DEXAMETHASONE SODIUM PHOSPHATE 10 MG/ML IJ SOLN
INTRAMUSCULAR | Status: DC | PRN
  Administered 2024-05-05: 5 mg via INTRAVENOUS

## 2024-05-05 SURGICAL SUPPLY — 39 items
APPLICATOR DR MATTHEWS STRL (MISCELLANEOUS) ×1 IMPLANT
BLADE SURG 15 STRL LF DISP TIS (BLADE) IMPLANT
BUR SURG 4X8 MED (BURR) IMPLANT
CANISTER SUCTION 3000ML PPV (SUCTIONS) ×1 IMPLANT
CLEANER TIP ELECTROSURG 2X2 (MISCELLANEOUS) ×1 IMPLANT
CORD BIPOLAR FORCEPS 12FT (ELECTRODE) IMPLANT
COVER SURGICAL LIGHT HANDLE (MISCELLANEOUS) ×1 IMPLANT
DRAPE HALF SHEET 40X57 (DRAPES) IMPLANT
ELECT COATED BLADE 2.86 ST (ELECTRODE) ×1 IMPLANT
ELECT NDL BLADE 2-5/6 (NEEDLE) IMPLANT
ELECT NEEDLE BLADE 2-5/6 (NEEDLE) ×1 IMPLANT
ELECTRODE REM PT RTRN 9FT ADLT (ELECTROSURGICAL) ×1 IMPLANT
GAUZE 4X4 16PLY ~~LOC~~+RFID DBL (SPONGE) ×1 IMPLANT
GAUZE XEROFORM 1X8 LF (GAUZE/BANDAGES/DRESSINGS) IMPLANT
GLOVE BIO SURGEON STRL SZ7.5 (GLOVE) ×1 IMPLANT
GLOVE BIOGEL PI IND STRL 8 (GLOVE) ×1 IMPLANT
GOWN STRL REUS W/ TWL LRG LVL3 (GOWN DISPOSABLE) ×2 IMPLANT
GOWN STRL REUS W/ TWL XL LVL3 (GOWN DISPOSABLE) ×1 IMPLANT
IV CATH 14GX2 1/4 (CATHETERS) ×1 IMPLANT
KIT BASIN OR (CUSTOM PROCEDURE TRAY) ×1 IMPLANT
KIT TURNOVER KIT B (KITS) ×1 IMPLANT
MARKER SKIN DUAL TIP RULER LAB (MISCELLANEOUS) ×1 IMPLANT
NDL 27GX1/2 REG BEVEL ECLIP (NEEDLE) ×1 IMPLANT
NDL HYPO 30X.5 LL (NEEDLE) ×1 IMPLANT
NEEDLE 27GX1/2 REG BEVEL ECLIP (NEEDLE) ×1 IMPLANT
NEEDLE HYPO 30X.5 LL (NEEDLE) ×1 IMPLANT
NS IRRIG 1000ML POUR BTL (IV SOLUTION) ×1 IMPLANT
PAD ARMBOARD POSITIONER FOAM (MISCELLANEOUS) ×2 IMPLANT
PATTIES SURGICAL .5 X3 (DISPOSABLE) IMPLANT
PENCIL SMOKE EVACUATOR (MISCELLANEOUS) ×1 IMPLANT
STAPLER VISISTAT 35W (STAPLE) ×1 IMPLANT
SUT VIC AB 3-0 SH 8-18 (SUTURE) IMPLANT
SUT VICRYL 3-0 RB1 18 ABS (SUTURE) IMPLANT
SWAB COLLECTION DEVICE MRSA (MISCELLANEOUS) IMPLANT
SWAB CULTURE ESWAB REG 1ML (MISCELLANEOUS) IMPLANT
TOWEL GREEN STERILE FF (TOWEL DISPOSABLE) ×1 IMPLANT
TRAY ENT MC OR (CUSTOM PROCEDURE TRAY) ×1 IMPLANT
TUBE CONNECTING 12X1/4 (SUCTIONS) ×1 IMPLANT
WATER STERILE IRR 1000ML POUR (IV SOLUTION) ×1 IMPLANT

## 2024-05-05 NOTE — Op Note (Signed)
 OPERATIVE NOTE  Johnny Lucas Date/Time of Admission: 05/05/2024  5:26 AM  CSN: [id number]:[id number] Attending Provider: Rush Coupe, MD Room/Bed: MCPO/NONE DOB: May 14, 1946 Age: 78 y.o.   Pre-Op Diagnosis: Oroantral fistula; Chronic osteomyelitis of maxilla; Right facial numbness; Chronic maxillary sinusitis;  Post-Op Diagnosis: Oroantral fistula; Chronic osteomyelitis of maxilla; Right facial numbness; Chronic maxillary sinusitis;  Procedure:  Repair right oroantral fistula 1.5x3cm with posteriorly based pedicled buccal myomucosal flap (1.7x6.5cm) , Adjacent tissue transfer mouth >10cm2 ,CPT 14041 Preparation of wound head (1.5x3cm oroantral fistuala) in preparation for flap inset , CPT 15004  Anesthesia: General  Surgeon(s): Marijane Shoulders, MD  Staff: Circulator: Nolen Baumann, RN Relief Circulator: Lyndia Sans, RN Scrub Person: Jacqualin Mate Circulator Assistant: Jace Martinet, RN; Flavio Huguenin, RN; Lilian Register, RN RN First Assistant: Lyndia Sans, RN  Implants: * No implants in log *  Specimens: ID Type Source Tests Collected by Time Destination  A : Right Maxillary Sinus Pus Tissue PATH Sinus Contents/Nasal Polyps AEROBIC/ANAEROBIC CULTURE W GRAM STAIN (SURGICAL/DEEP WOUND) Rush Coupe, MD 05/05/2024 [phone]     Complications: none  EBL: 20 ML  IVF: Per anesthesia ML  Condition: stable  Operative Findings:  1.5x3cm oroantral fistula with devitalized bone surrounding. Maxillary sinus moderate mucosal edema with purulent drainage.   Indications for Procedure: Johnny Lucas is a 78 year old male with a history of immunosuppression due to multiple myeloma, history of chronic rhinosinusitis status post functional endoscopic sinus surgery March 25, 2023, with ongoing chronic maxillary sinusitis complicated by osteomyelitis of the maxilla status post partial maxillectomy and debridement February 16, 2024 with postoperative IV antibiotic  therapy.  I have been following him closely in the outpatient setting which demonstrated good healing with increasing symptoms from the oral antral fistula.  After discussing management options including observation versus surgery the patient has requested surgical repair and presents now for surgical management of his oral antral fistula.  Description of Operation:  The patient was identified in the preoperative area and consent confirmed in the chart.  He was brought to the operating room by the anesthetist and a preoperative huddle was performed confirming the patient's identity and procedure to be performed.  Once all were in agreement to proceed with surgery.  General anesthesia was induced and he was intubated with oral endotracheal tube.  The tube was secured and the patient was turned 90 degrees from anesthesia.  The patient's oral cavity was examined with findings noted above.   The patient was prepped and draped in standard fashion for procedure of this kind.  A final preoperative pause was performed and we proceeded with surgery.  I began with debridement of the oral antral fistula defect 1.5 x 3 cm.  There was devitalized bone.  This was removed with a rongeur.  The surrounding tissue was infiltrated with local anesthetic 1% lidocaine  1 100,000 epinephrine .  Next periosteal elevator was used to elevate the hard palate mucosa in the subperiosteal mucoperiosteal plane.  Laterally mucosa was elevated off the lateral maxilla.  Once adequate elevation was performed residual devitalized bone was debrided with the 4 mm egg bur.  All devitalized bone was removed to the level of bleeding tissue.  The maxillary sinus was cultured which included some purulent discharge and the sinus was copiously irrigated with sterile saline lavage.  Next the defect was measured and a posteriorly based buccal flap was designed.  The maxillary sinus mucosa was lifted and the submucoperiosteal plane inverted and closed with  interrupted 3-0 Vicryl sutures.  I proceeded with adjacent tissue transfer of the mouth with a 1.7 x 6.5 cm posteriorly based and pedicled buccal myomucosal flap.  The parotid duct was identified and marked.  The flap was designed below the level of the parotid duct opening.  The posterior pedicle was based to the retromolar trigone.  The anterior limit was about 1 cm posterior to the oral commissure.  After judicious use of local anesthetic was infiltrated in the buccal mucosa a 15 blade was used to make the mucosal incision.  Next fine toothed forceps and a cutting Bovie on low power was used to dissect the flap from distal to proximal.  About 90% of the buccal muscle layer was removed leaving a thin layer on top of the buccal fat pad.  No violation of the fascia overlying the buccal fat pad occurred.  Q-tips and 5 tenotomy scissors were used to facilitate a clean dissection.  Once adequate harvest of the flap was performed the flap was then inset into the defect.   The buccal flap was inset with a combination of interrupted 3-0 Vicryl horizontal mattress and 6 simple sutures.  Watertight closure was achieved.  A circumdental suture was placed around the anterior aspect near the lateral incisor.  Once inset was completed, the donor site was closed.  A double prong skin hook was placed at the oral commissure and closure of the donor site was performed with interrupted 3-0 Vicryl horizontal mattress sutures.  Complete closure was achieved without excessive tension.  The wounds were copiously irrigated and the oral cavity was cleansed.  An orogastric tube was used to suction out pharyngoesophageal contents.  The patient was then turned back to the anesthetist extubated brought to the recovery room in stable condition.   Marijane Shoulders, MD [facility]  05/05/2024

## 2024-05-05 NOTE — Discharge Instructions (Signed)
 Discharge instructions after oral surgery  Adhere to a soft non-chew diet for 2 weeks (blenderized/pureed foods) are ideal Use antibiotic mouth rinse as directed Some facial/cheek swelling & tenderness is normal. Significant facial pain/swelling/bleeding or purulent drainage is not normal; contact Dr. Ralston Burkes immediately.  Rush Coupe MD [facility] [place] [phone] [address] [place], [place] [address] Phone: [phone]

## 2024-05-05 NOTE — Anesthesia Procedure Notes (Signed)
 Procedure Name: Intubation Date/Time: 05/05/2024 7:50 AM  Performed by: Rosa Wyly J, CRNAPre-anesthesia Checklist: Patient identified, Emergency Drugs available, Suction available and Patient being monitored Patient Re-evaluated:Patient Re-evaluated prior to induction Oxygen Delivery Method: Circle System Utilized Preoxygenation: Pre-oxygenation with 100% oxygen Induction Type: IV induction Ventilation: Mask ventilation without difficulty Laryngoscope Size: Mac and 3 Grade View: Grade I Tube type: Oral Tube size: 7.5 mm Number of attempts: 1 Airway Equipment and Method: Stylet Placement Confirmation: ETT inserted through vocal cords under direct vision, positive ETCO2 and breath sounds checked- equal and bilateral Secured at: 24 cm Tube secured with: Tape Dental Injury: Teeth and Oropharynx as per pre-operative assessment

## 2024-05-05 NOTE — Transfer of Care (Signed)
 Immediate Anesthesia Transfer of Care Note  Patient: Johnny Lucas  Procedure(s) Performed: REPAIR OF RIGHT OROANTRAL FISTULA WITH BUCCAL FLAP (Right: Mouth) ORAL CAVITY ADJACENT TISSUE TRANSFER (Right: Mouth)  Patient Location: PACU  Anesthesia Type:General  Level of Consciousness: drowsy  Airway & Oxygen Therapy: Patient Spontanous Breathing and Patient connected to face mask oxygen  Post-op Assessment: Report given to RN and Post -op Vital signs reviewed and stable  Post vital signs: Reviewed and stable  Last Vitals:  Vitals Value Taken Time  BP 144/66 05/05/24 1022  Temp    Pulse 58 05/05/24 1027  Resp 16 05/05/24 1027  SpO2 99 % 05/05/24 1027  Vitals shown include unfiled device data.  Last Pain:  Vitals:   05/05/24 0627  TempSrc:   PainSc: 0-No pain         Complications: No notable events documented.

## 2024-05-05 NOTE — H&P (Signed)
 Johnny Lucas is an 78 y.o. male.    Chief Complaint:  Oroantral fistula   HPI: Patient presents today for planned elective procedure.  He/she denies any interval change in history since office visit on 04/14/24.   Past Medical History:  Diagnosis Date   Anxiety    Bone metastases 09/10/2016   Breast cancer, male [facility]) 2011   Stage IV breast cancer; radiation and tamoxifen   Overview:  Left breast ca with mets bone  Overview:  METS TO BONE   Bronchitis    uses inhaler   Cough    uses inhaler   GERD (gastroesophageal reflux disease)    Headache    History of blood transfusion    Hypertension    Macular degeneration    bilateral   Memory loss    mild   Multiple myeloma ([name] 02/18/2017   Peripheral neuropathy    feet   Pneumonia    mild case 1-2 yrs ago (2023 or 2024)    Past Surgical History:  Procedure Laterality Date   BACK SURGERY     C2-C3 fusion   BREAST BIOPSY Left    cancer   CATARACT EXTRACTION W/PHACO Left 02/03/2019   Procedure: CATARACT EXTRACTION PHACO AND INTRAOCULAR LENS PLACEMENT (IOC);  Surgeon: Tarri Farm, MD;  Location: [facility];  Service: Ophthalmology;  Laterality: Left;  CDE: 15.89   CATARACT EXTRACTION W/PHACO Right 07/25/2021   Procedure: CATARACT EXTRACTION PHACO AND INTRAOCULAR LENS PLACEMENT (IOC);  Surgeon: Tarri Farm, MD;  Location: [facility];  Service: Ophthalmology;  Laterality: Right;  CDE   26.71   COLONOSCOPY     HERNIA REPAIR     MAXILLARY ANTROSTOMY Right 02/16/2024   Procedure: REVISION MAXILLARY ANTROSTOMY WITH TISSUE REMOVAL;  Surgeon: Rush Coupe, MD;  Location[facility] OR;  Service: ENT;  Laterality: Right;   NASAL SINUS SURGERY Right 02/16/2024   Procedure: RIGHT ENDOSCOPIC SINUS SURGERY;  Surgeon: Rush Coupe, MD;  Location: [facility] OR;  Service: ENT;  Laterality: Right;   NASAL TURBINATE REDUCTION Right 02/16/2024   Procedure: PARTIAL MAXILLECTOMY AND DENTAL EXTRACTIONS;  Surgeon: Rush Coupe, MD;  Location: [facility] OR;  Service:  ENT;  Laterality: Right;   PORTACATH PLACEMENT Left 06/10/2018   Procedure: INSERTION PORT-A-CATH;  Surgeon: Awilda Bogus, MD;  Location: [facility];  Service: General;  Laterality: Left;   right arm surgery     bone cancer and rod placed   SINUS ENDO WITH FUSION Bilateral 03/25/2023   Procedure: FUNCTIONAL ENDOSCOPIC SINUS SURGERY WITH FUSION AND NAVIGATION;  Surgeon: Rush Coupe, MD;  Location: [facility] OR;  Service: ENT;  Laterality: Bilateral;    Family History  Problem Relation Age of Onset   Stroke Mother    Cancer Maternal Aunt        cancer NOS; died in her 34s   Lung cancer Maternal Uncle        smoker    Social History:  reports that he has never smoked. He has quit using smokeless tobacco.  His smokeless tobacco use included chew. He reports current alcohol  use of about 24.0 standard drinks of alcohol  per week. He reports that he does not use drugs.  Allergies:  Allergies  Allergen Reactions   Cefepime      Suspected severe thrombocytopenia is a result of cefepime  induced antigen platelet destruction    Medications Prior to Admission  Medication Sig Dispense Refill   acyclovir  (ZOVIRAX ) 800 MG tablet TAKE 1 TABLET TWICE DAILY 180 tablet 3   ALPRAZolam  (XANAX )  0.5 MG tablet TAKE 1 TABLET BY MOUTH AT BEDTIME AS NEEDED FOR anxiety / SLEEP 30 tablet 5   aspirin  EC 81 MG tablet Take 81 mg by mouth daily.      cetirizine  (ZYRTEC ) 10 MG tablet Take 10 mg by mouth daily.     chlorhexidine  (PERIDEX ) 0.12 % solution Use as directed 5 mLs in the mouth or throat daily as needed (mouth).     dexamethasone  (DECADRON ) 4 MG tablet Take 20 mg by mouth every 30 (thirty) days.     folic acid  (FOLVITE ) 1 MG tablet TAKE 1 TABLET BY MOUTH DAILY 90 tablet 2   gabapentin  (NEURONTIN ) 300 MG capsule Take 1 capsule (300 mg total) by mouth 3 (three) times daily. 180 capsule 2   metoprolol  succinate (TOPROL -XL) 100 MG 24 hr tablet Take 100 mg by mouth in the morning.     POMALYST  2 MG capsule Take 1  capsule (2 mg total) by mouth daily. 21 capsule 0   tamoxifen  (NOLVADEX ) 20 MG tablet TAKE 1 TABLET EVERY DAY 90 tablet 3   acetaminophen  (TYLENOL ) 500 MG tablet Take 1,000 mg by mouth every 8 (eight) hours as needed for mild pain (pain score 1-3) or moderate pain (pain score 4-6).     albuterol  (VENTOLIN  HFA) 108 (90 Base) MCG/ACT inhaler Inhale 2 puffs into the lungs every 6 (six) hours as needed for wheezing or shortness of breath.     pantoprazole  (PROTONIX ) 40 MG tablet TAKE 1 TABLET EVERY OTHER DAY 45 tablet 3    No results found. However, due to the size of the patient record, not all encounters were searched. Please check Results Review for a complete set of results. No results found.  ROS: negative other than stated in HPI  Blood pressure (!) 141/72, pulse (!) 57, temperature 98.2 F (36.8 C), temperature source Oral, resp. rate 18, height 5\' 9"  (1.753 m), weight 86.2 kg, SpO2 98%.  PHYSICAL EXAM: General: Resting comfortably in NAD  Lungs: Non-labored respiratinos  Studies Reviewed: none   Assessment/Plan Osteomyelitis of the maxilla Oroantral fistula, right  Proceed with repair oroantral fistual with right sided pedicled buccal flap. Discussed procedure in detail including RBA; risks discussed including recurrent fistula, flap failure, effacement of gingivobuccal sulcus, inability to wear a maxillary denture, pain, bleeding, infection, numbness, need for further surgery, risks of anesthesia.     Electronically signed by:  Rush Coupe, MD  Staff Physician Facial Plastic & Reconstructive Surgery Otolaryngology - Head and Neck Surgery [facility]  05/05/2024, 7:31 AM

## 2024-05-06 NOTE — Anesthesia Postprocedure Evaluation (Signed)
 Anesthesia Post Note  Patient: Johnny Lucas  Procedure(s) Performed: REPAIR OF RIGHT OROANTRAL FISTULA WITH BUCCAL FLAP (Right: Mouth) ORAL CAVITY ADJACENT TISSUE TRANSFER (Right: Mouth)     Patient location during evaluation: PACU Anesthesia Type: General Level of consciousness: awake and alert Pain management: pain level controlled Vital Signs Assessment: post-procedure vital signs reviewed and stable Respiratory status: spontaneous breathing, nonlabored ventilation and respiratory function stable Cardiovascular status: blood pressure returned to baseline and stable Postop Assessment: no apparent nausea or vomiting Anesthetic complications: no   No notable events documented.  Last Vitals:  Vitals:   05/05/24 1045 05/05/24 1055  BP: 134/69   Pulse: (!) 58   Resp: 10   Temp:  (!) 36.1 C  SpO2: 91%     Last Pain:  Vitals:   05/05/24 1055  TempSrc:   PainSc: 0-No pain                 Erin Havers

## 2024-05-07 ENCOUNTER — Encounter (HOSPITAL_COMMUNITY): Payer: Self-pay | Admitting: Otolaryngology

## 2024-05-08 LAB — AEROBIC/ANAEROBIC CULTURE W GRAM STAIN (SURGICAL/DEEP WOUND)

## 2024-05-08 LAB — CARBAPENEM RESISTANCE PANEL
Carba Resistance IMP Gene: NOT DETECTED
Carba Resistance KPC Gene: NOT DETECTED
Carba Resistance NDM Gene: NOT DETECTED
Carba Resistance OXA48 Gene: NOT DETECTED
Carba Resistance VIM Gene: NOT DETECTED

## 2024-05-09 ENCOUNTER — Other Ambulatory Visit: Payer: Self-pay | Admitting: *Deleted

## 2024-05-12 DIAGNOSIS — J320 Chronic maxillary sinusitis: Secondary | ICD-10-CM | POA: Diagnosis not present

## 2024-05-19 DIAGNOSIS — M272 Inflammatory conditions of jaws: Secondary | ICD-10-CM | POA: Diagnosis not present

## 2024-05-19 DIAGNOSIS — J320 Chronic maxillary sinusitis: Secondary | ICD-10-CM | POA: Diagnosis not present

## 2024-05-25 ENCOUNTER — Inpatient Hospital Stay: Admitting: Hematology

## 2024-05-25 ENCOUNTER — Inpatient Hospital Stay (HOSPITAL_BASED_OUTPATIENT_CLINIC_OR_DEPARTMENT_OTHER): Admitting: Hematology

## 2024-05-25 ENCOUNTER — Other Ambulatory Visit

## 2024-05-25 ENCOUNTER — Inpatient Hospital Stay

## 2024-05-25 VITALS — BP 136/84 | Wt 192.2 lb

## 2024-05-25 VITALS — BP 158/74 | HR 50 | Temp 98.6°F | Resp 18

## 2024-05-25 DIAGNOSIS — C9000 Multiple myeloma not having achieved remission: Secondary | ICD-10-CM

## 2024-05-25 DIAGNOSIS — D801 Nonfamilial hypogammaglobulinemia: Secondary | ICD-10-CM | POA: Diagnosis not present

## 2024-05-25 DIAGNOSIS — Z5112 Encounter for antineoplastic immunotherapy: Secondary | ICD-10-CM | POA: Diagnosis not present

## 2024-05-25 DIAGNOSIS — C7951 Secondary malignant neoplasm of bone: Secondary | ICD-10-CM

## 2024-05-25 DIAGNOSIS — I129 Hypertensive chronic kidney disease with stage 1 through stage 4 chronic kidney disease, or unspecified chronic kidney disease: Secondary | ICD-10-CM | POA: Diagnosis not present

## 2024-05-25 DIAGNOSIS — E611 Iron deficiency: Secondary | ICD-10-CM | POA: Diagnosis not present

## 2024-05-25 DIAGNOSIS — C9002 Multiple myeloma in relapse: Secondary | ICD-10-CM | POA: Diagnosis not present

## 2024-05-25 DIAGNOSIS — C50122 Malignant neoplasm of central portion of left male breast: Secondary | ICD-10-CM

## 2024-05-25 DIAGNOSIS — G629 Polyneuropathy, unspecified: Secondary | ICD-10-CM | POA: Diagnosis not present

## 2024-05-25 DIAGNOSIS — D508 Other iron deficiency anemias: Secondary | ICD-10-CM

## 2024-05-25 DIAGNOSIS — D702 Other drug-induced agranulocytosis: Secondary | ICD-10-CM

## 2024-05-25 LAB — CBC WITH DIFFERENTIAL/PLATELET
Abs Immature Granulocytes: 0.01 10*3/uL (ref 0.00–0.07)
Basophils Absolute: 0 10*3/uL (ref 0.0–0.1)
Basophils Relative: 1 %
Eosinophils Absolute: 0.3 10*3/uL (ref 0.0–0.5)
Eosinophils Relative: 5 %
HCT: 34.6 % — ABNORMAL LOW (ref 39.0–52.0)
Hemoglobin: 11.4 g/dL — ABNORMAL LOW (ref 13.0–17.0)
Immature Granulocytes: 0 %
Lymphocytes Relative: 24 %
Lymphs Abs: 1.3 10*3/uL (ref 0.7–4.0)
MCH: 34.9 pg — ABNORMAL HIGH (ref 26.0–34.0)
MCHC: 32.9 g/dL (ref 30.0–36.0)
MCV: 105.8 fL — ABNORMAL HIGH (ref 80.0–100.0)
Monocytes Absolute: 0.5 10*3/uL (ref 0.1–1.0)
Monocytes Relative: 8 %
Neutro Abs: 3.4 10*3/uL (ref 1.7–7.7)
Neutrophils Relative %: 62 %
Platelets: 160 10*3/uL (ref 150–400)
RBC: 3.27 MIL/uL — ABNORMAL LOW (ref 4.22–5.81)
RDW: 13.6 % (ref 11.5–15.5)
WBC: 5.6 10*3/uL (ref 4.0–10.5)
nRBC: 0 % (ref 0.0–0.2)

## 2024-05-25 LAB — COMPREHENSIVE METABOLIC PANEL WITH GFR
ALT: 12 U/L (ref 0–44)
AST: 18 U/L (ref 15–41)
Albumin: 3.3 g/dL — ABNORMAL LOW (ref 3.5–5.0)
Alkaline Phosphatase: 33 U/L — ABNORMAL LOW (ref 38–126)
Anion gap: 7 (ref 5–15)
BUN: 12 mg/dL (ref 8–23)
CO2: 23 mmol/L (ref 22–32)
Calcium: 7.8 mg/dL — ABNORMAL LOW (ref 8.9–10.3)
Chloride: 108 mmol/L (ref 98–111)
Creatinine, Ser: 1.14 mg/dL (ref 0.61–1.24)
GFR, Estimated: >60 mL/min (ref >60)
Glucose, Bld: 95 mg/dL (ref 70–99)
Potassium: 4.1 mmol/L (ref 3.5–5.1)
Sodium: 138 mmol/L (ref 135–145)
Total Bilirubin: 0.5 mg/dL (ref 0.0–1.2)
Total Protein: 5.6 g/dL — ABNORMAL LOW (ref 6.5–8.1)

## 2024-05-25 LAB — MAGNESIUM: Magnesium: 2 mg/dL (ref 1.7–2.4)

## 2024-05-25 MED ORDER — DEXAMETHASONE 4 MG PO TABS
20.0000 mg | ORAL_TABLET | Freq: Once | ORAL | Status: AC
  Administered 2024-05-25: 20 mg via ORAL
  Filled 2024-05-25: qty 5

## 2024-05-25 MED ORDER — SODIUM CHLORIDE 0.9% FLUSH
10.0000 mL | Freq: Once | INTRAVENOUS | Status: AC
  Administered 2024-05-25: 10 mL via INTRAVENOUS

## 2024-05-25 MED ORDER — IMMUNE GLOBULIN (HUMAN) 10 GM/100ML IV SOLN
40.0000 g | Freq: Once | INTRAVENOUS | Status: AC
  Administered 2024-05-25: 40 g via INTRAVENOUS
  Filled 2024-05-25: qty 400

## 2024-05-25 MED ORDER — DEXTROSE 5 % IV SOLN: INTRAVENOUS | Status: DC

## 2024-05-25 MED ORDER — DARATUMUMAB-HYALURONIDASE-FIHJ 1800-30000 MG-UT/15ML ~~LOC~~ SOLN
1800.0000 mg | Freq: Once | SUBCUTANEOUS | Status: AC
  Administered 2024-05-25: 1800 mg via SUBCUTANEOUS
  Filled 2024-05-25: qty 15

## 2024-05-25 MED ORDER — ACETAMINOPHEN 325 MG PO TABS
650.0000 mg | ORAL_TABLET | Freq: Once | ORAL | Status: AC
  Administered 2024-05-25: 650 mg via ORAL
  Filled 2024-05-25: qty 2

## 2024-05-25 MED ORDER — CETIRIZINE HCL 10 MG/ML IV SOLN
10.0000 mg | Freq: Once | INTRAVENOUS | Status: AC
  Administered 2024-05-25: 10 mg via INTRAVENOUS
  Filled 2024-05-25: qty 1

## 2024-05-25 MED ORDER — SODIUM CHLORIDE 0.9% FLUSH
10.0000 mL | Freq: Once | INTRAVENOUS | Status: AC | PRN
  Administered 2024-05-25: 10 mL

## 2024-05-25 MED ORDER — HEPARIN SOD (PORK) LOCK FLUSH 100 UNIT/ML IV SOLN
500.0000 [IU] | Freq: Once | INTRAVENOUS | Status: AC | PRN
  Administered 2024-05-25: 500 [IU]

## 2024-05-25 MED ORDER — FAMOTIDINE IN NACL 20-0.9 MG/50ML-% IV SOLN
20.0000 mg | Freq: Once | INTRAVENOUS | Status: AC
  Administered 2024-05-25: 20 mg via INTRAVENOUS
  Filled 2024-05-25: qty 50

## 2024-05-25 NOTE — Patient Instructions (Signed)
 [facility] [place] - A DEPT OF [facility]  Discharge Instructions: Thank you for choosing [facility] to provide your oncology and hematology care.  If you have a lab appointment with the Cancer Center - please note that after April 8th, 2024, all labs will be drawn in the cancer center.  You do not have to check in or register with the main entrance as you have in the past but will complete your check-in in the cancer center.  Wear comfortable clothing and clothing appropriate for easy access to any Portacath or PICC line.   We strive to give you quality time with your provider. You may need to reschedule your appointment if you arrive late (15 or more minutes).  Arriving late affects you and other patients whose appointments are after yours.  Also, if you miss three or more appointments without notifying the office, you may be dismissed from the clinic at the provider's discretion.      For prescription refill requests, have your pharmacy contact our office and allow 72 hours for refills to be completed.    Today you received the following chemotherapy and/or immunotherapy agents Daratumumab  and IVIG, return as scheduled.   To help prevent nausea and vomiting after your treatment, we encourage you to take your nausea medication as directed.  BELOW ARE SYMPTOMS THAT SHOULD BE REPORTED IMMEDIATELY: *FEVER GREATER THAN 100.4 F (38 C) OR HIGHER *CHILLS OR SWEATING *NAUSEA AND VOMITING THAT IS NOT CONTROLLED WITH YOUR NAUSEA MEDICATION *UNUSUAL SHORTNESS OF BREATH *UNUSUAL BRUISING OR BLEEDING *URINARY PROBLEMS (pain or burning when urinating, or frequent urination) *BOWEL PROBLEMS (unusual diarrhea, constipation, pain near the anus) TENDERNESS IN MOUTH AND THROAT WITH OR WITHOUT PRESENCE OF ULCERS (sore throat, sores in mouth, or a toothache) UNUSUAL RASH, SWELLING OR PAIN  UNUSUAL VAGINAL DISCHARGE OR ITCHING   Items with * indicate a potential  emergency and should be followed up as soon as possible or go to the Emergency Department if any problems should occur.  Please show the CHEMOTHERAPY ALERT CARD or IMMUNOTHERAPY ALERT CARD at check-in to the Emergency Department and triage nurse.  Should you have questions after your visit or need to cancel or reschedule your appointment, please contact [facility] [place] - A DEPT OF [facility] [phone]  and follow the prompts.  Office hours are 8:00 a.m. to 4:30 p.m. Monday - Friday. Please note that voicemails left after 4:00 p.m. may not be returned until the following business day.  We are closed weekends and major holidays. You have access to a nurse at all times for urgent questions. Please call the main number to the clinic [phone] and follow the prompts.  For any non-urgent questions, you may also contact your provider using MyChart. We now offer e-Visits for anyone 69 and older to request care online for non-urgent symptoms. For details visit [email or web address].   Also download the MyChart app! Go to the app store, search "MyChart", open the app, select [facility], and log in with your MyChart username and password.

## 2024-05-25 NOTE — Patient Instructions (Addendum)
 [facility] at [facility] Discharge Instructions   You were seen and examined today by Dr. Cheree Cords.  He reviewed the results of your lab work which are normal/stable.   Continue to hold Pomalyst .   We will proceed with your treatment today.   Return as scheduled.    Thank you for choosing [facility] at [facility] to provide your oncology and hematology care.  To afford each patient quality time with our provider, please arrive at least 15 minutes before your scheduled appointment time.   If you have a lab appointment with the Cancer Center please come in thru the Main Entrance and check in at the main information desk.  You need to re-schedule your appointment should you arrive 10 or more minutes late.  We strive to give you quality time with our providers, and arriving late affects you and other patients whose appointments are after yours.  Also, if you no show three or more times for appointments you may be dismissed from the clinic at the providers discretion.     Again, thank you for choosing [facility].  Our hope is that these requests will decrease the amount of time that you wait before being seen by our physicians.       _____________________________________________________________  Should you have questions after your visit to [facility], please contact our office at [phone] and follow the prompts.  Our office hours are 8:00 a.m. and 4:30 p.m. Monday - Friday.  Please note that voicemails left after 4:00 p.m. may not be returned until the following business day.  We are closed weekends and major holidays.  You do have access to a nurse 24-7, just call the main number to the clinic [phone] and do not press any options, hold on the line and a nurse will answer the phone.    For prescription refill requests, have your pharmacy contact our office and allow 72 hours.    Due to Covid, you will need  to wear a mask upon entering the hospital. If you do not have a mask, a mask will be given to you at the Main Entrance upon arrival. For doctor visits, patients may have 1 support person age 76 or older with them. For treatment visits, patients can not have anyone with them due to social distancing guidelines and our immunocompromised population.

## 2024-05-25 NOTE — Progress Notes (Signed)
 Patient okay for Dara and IVIG today per Dr. Cheree Cords. Patient tolerated Daratumumab  injection with no complaints voiced. See MAR for details. Lab reviewed. Injection site clean and dry with no bruising or swelling noted at site. Band aid applied.  Patient tolerated therapy with no complaints voiced. Side effects with management reviewed with understanding verbalized. Port site clean and dry with no bruising or swelling noted at site. Good blood return noted before and after administration of therapy. Band aid applied. Patient left in satisfactory condition with VSS and no s/s of distress noted.

## 2024-05-25 NOTE — Progress Notes (Signed)
 [facility] [address], [place] [address]    Clinic Day:  05/25/2024  Referring physician: Paulett Boros, MD  Patient Care Team: Paulett Boros, MD as PCP - General (Hematology) Paulett Boros, MD as Consulting Physician (Oncology)   ASSESSMENT & PLAN:   Assessment: 1.  Relapsed multiple myeloma: -Autologous stem cell transplant on 10/08/2017 -Post transplant consolidation with CyCarD for 2 cycles with M spike undetectable. -Maintenance therapy with carfilzomib  70 mg per metered square days 1 and 15 every 28 days from 04/20/2018, dose reduced to 35 mg per metered square on 01/05/2019, titrated up to 56 mg per metered square on 01/09/2020. 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 -PET scan on 02/18/2020 showed new left supraclavicular lymph node at the thoracic inlet, SUV 14.2.  Multifocal osseous lesions largely improved, though residual lesions noted including right acromion, left acromion, thyroid  cartilage, right sternum.  Additional lesions throughout the sternum, bilateral ribs, thoracolumbar spine, bilateral pelvis have resolved. -BMBX on 02/14/2020 shows plasma cell myeloma, 60-70% of cells.  FISH for myeloma was negative.  Cytogenetics was normal. -4 cycles of daratumumab , bortezomib  and dexamethasone  from 02/28/2020 through 04/29/2020. -Daratumumab , pomalidomide  and dexamethasone  started on 05/28/2020.  He is receiving daratumumab  every 2 weeks, Pomalyst  3 mg days 1-21, dexamethasone  20 mg weekly. -Pomalidomide  dose reduced to 2 mg days 1-21 around the first week of July 2021, due to severe weakness. - Pomalidomide  and Darzalex  held from 02/16/2024 through 04/26/2024 due to hospitalization, right partial maxillectomy and extraction of remaining right maxillary molar, first premolar and canine, required prolonged antibiotics    Plan: 1.  Relapsed IgG kappa multiple myeloma: - Last Darzalex  was on  04/27/2024. - He had surgery again on 05/05/2024.  He is off antibiotics about 12 days ago. - Reviewed labs today: Normal LFTs.  Creatinine normal.  CBC grossly normal. - Recommend that he restart Darzalex  today.  Will continue to hold pomalidomide  until next visit in 4 weeks.    2.  Myeloma bone disease: - Continue to hold denosumab  indefinitely.   3.  Macrocytic anemia: - Multifactorial from CKD, functional iron deficiency and myelosuppression.  Hemoglobin improved to 11.4.  Will give Retacrit  if hemoglobin drops below 9.   4.  ID prophylaxis: - Continue acyclovir  twice daily.   5.  Peripheral neuropathy: - Continue gabapentin  600 mg 3 times daily.   6.  History of breast cancer: - Is not taking tamoxifen  since surgery.  He will restart back on tamoxifen .  7.  Hypogammaglobulinemia: - He received first dose of IVIG on 04/27/2024 and tolerated well. - He will proceed with IVIG 40 g every 4 weeks.    Orders Placed This Encounter  Procedures   Comprehensive metabolic panel    Standing Status:   Future    Expected Date:   09/14/2024    Expiration Date:   09/14/2025   CBC with Differential    Standing Status:   Future    Expected Date:   09/14/2024    Expiration Date:   09/15/2025     Johnny Lucas,acting as a scribe for Paulett Boros, MD.,have documented all relevant documentation on the behalf of Paulett Boros, MD,as directed by  Paulett Boros, MD while in the presence of Paulett Boros, MD.  I, Paulett Boros MD, have reviewed the above documentation for accuracy and completeness, and I agree with the above.    Paulett Boros, MD   5/29/20255:06 PM  CHIEF COMPLAINT:  Diagnosis: multiple myeloma    Cancer Staging  Multiple myeloma ([name] Staging form: Plasma Cell Myeloma and Plasma Cell Disorders, AJCC 8th Edition - Clinical stage from 05/03/2017: Beta-2 -microglobulin (mg/L): 3.5, Albumin  (g/dL): 3.4, ISS: Stage II, High-risk  cytogenetics: Absent, LDH: Not assessed - Signed by Doretta Gant, PA-C on 05/03/2017    Prior Therapy: 1. RVD x 6 cycles from 03/01/2017 to 06/29/2017. 2. Stem cell transplant on 09/30/2017. 3. Carfilzomib  and cyclophosphamide  x 2 cycles from 02/14/2018 to 03/29/2018. 4. Carfilzomib  x 22 cycles from 04/20/2018 to 01/23/2020.  Current Therapy:  Darzalex  Faspro monthly; Pomalyst  2 mg 3/4 weeks; Xgeva  monthly    HISTORY OF PRESENT ILLNESS:   Oncology History  Breast cancer, male ([name]  12/31/2009 Initial Biopsy   Biopsy of L breast    12/31/2009 Pathology Results   Invasive ductal carcinoma, ER/PR+, HER 2 negative   12/31/2009 Imaging   Ultrasound showing a 2.43 x 1.85 x 3 cm hypoechoic spiculated mass in the 12 o clock L breast retroareolar region   01/01/2010 -  Anti-estrogen oral therapy   Tamoxifen  20 mg daily   01/06/2010 Imaging   Bone scan abnormal uptake in the diaphysis of the R humerus, abnormal in the R third, fifth and sixth ribs, lesion also noted in the sternum.   02/03/2010 Surgery   Rod placement and fixation of R humerus by Dr. Aloha Arnold   02/05/2010 - 02/18/2010 Radiation Therapy   30Gy in 10 fractions of 3 Gy per fraction to R pathologic fracture   03/11/2010 -  Chemotherapy   Denosumab  monthly, now every 3 months. Started at [facility]    06/09/2016 Imaging   Three hypermetabolic osseous lesions in the sternum, left ilium and right ilium, as discussed above, likely represent osseous metastases. At this time, these are not recognizable on the CT images. 2. No extra skeletal metastatic disease identified in the neck, chest, abdomen or pelvis.   10/13/2016 Progression   PET shows various new and enlarging osseous metastatic lesions with no definite extra osseous metastatic disease currently identified.    12/31/2016 Progression   1. Multifocal hypermetabolic osseous metastases throughout the axial and proximal appendicular skeleton, which are increased in size, number and  metabolism since 10/13/2016 PET-CT. 2. 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 3. No additional sites of hypermetabolic metastatic disease. 4. Chronic right mastoid sinusitis. 5. Aortic atherosclerosis.  One vessel coronary atherosclerosis.   Multiple myeloma ([name]  02/12/2017 Bone Marrow Biopsy   The marrow was variably cellular with large peritrabecular aggregates of kappa restricted plasma cells (66% by aspirate, 30% by Cd138). Cytogenetics +11.    03/01/2017 - 06/29/2017 Chemotherapy   RVD    05/26/2017 Bone Marrow Biopsy   Performed at [facility]:  Plasma cell myeloma in a 30% cellular marrow with decreased trilineage hematopoiesis and 42% kappa light chain restricted plasma cells on the aspirate smears and large aggregates on the core biopsy.     07/12/2017 - 09/01/2017 Chemotherapy   3 cycles of carfizolmib/cyclophosphamide /dexamethasone      10/08/2017 Bone Marrow Transplant   Autotransplant at [facility]   02/28/2020 - 09/01/2022 Chemotherapy   Patient is on Treatment Plan : Daratumumab  q28d     09/01/2022 -  Chemotherapy   Patient is on Treatment Plan : MYELOMA Daratumumab  Faspro Subq q28d     Multiple myeloma not having achieved remission ([name]  06/29/2017 Initial Diagnosis  Multiple myeloma not having achieved remission ([name]   02/28/2020 - 09/01/2022 Chemotherapy   Patient is on Treatment Plan : Daratumumab  q28d     09/01/2022 -  Chemotherapy   Patient is on Treatment Plan : MYELOMA Daratumumab  Faspro Subq q28d        INTERVAL HISTORY:   Johnny Lucas is a 78 y.o. male seen for follow-up of multiple myeloma.  He was last seen by me on 04/26/24.  Since his last visit, he underwent repair of right oroantral fistula with buccal flap on 05/05/24 with Dr. Eligha Grumbles states part of his buccal flap did not receive good blood flow and partially  successful.   Today, he states that he is doing well overall. His appetite level is at 100%. His energy level is at 75%. Elyas has not taken Pomalyst  since he was told to stop. He finished antibiotics 12 days ago and tolerated IVIG well. He discontinued tamoxifen  prior to surgery. Colbey did not take dexamethasone  today.   PAST MEDICAL HISTORY:   Past Medical History: Past Medical History:  Diagnosis Date   Anxiety    Bone metastases 09/10/2016   Breast cancer, male [facility]) 2011   Stage IV breast cancer; radiation and tamoxifen   Overview:  Left breast ca with mets bone  Overview:  METS TO BONE   Bronchitis    uses inhaler   Cough    uses inhaler   GERD (gastroesophageal reflux disease)    Headache    History of blood transfusion    Hypertension    Macular degeneration    bilateral   Memory loss    mild   Multiple myeloma ([name] 02/18/2017   Peripheral neuropathy    feet   Pneumonia    mild case 1-2 yrs ago (2023 or 2024)    Surgical History: Past Surgical History:  Procedure Laterality Date   BACK SURGERY     C2-C3 fusion   BREAST BIOPSY Left    cancer   CATARACT EXTRACTION W/PHACO Left 02/03/2019   Procedure: CATARACT EXTRACTION PHACO AND INTRAOCULAR LENS PLACEMENT (IOC);  Surgeon: Tarri Farm, MD;  Location: [facility];  Service: Ophthalmology;  Laterality: Left;  CDE: 15.89   CATARACT EXTRACTION W/PHACO Right 07/25/2021   Procedure: CATARACT EXTRACTION PHACO AND INTRAOCULAR LENS PLACEMENT (IOC);  Surgeon: Tarri Farm, MD;  Location: [facility];  Service: Ophthalmology;  Laterality: Right;  CDE   26.71   CLOSURE ORAL ANTRAL FISTULA Right 05/05/2024   Procedure: REPAIR OF RIGHT OROANTRAL FISTULA WITH BUCCAL FLAP;  Surgeon: Rush Coupe, MD;  Location: [facility] OR;  Service: ENT;  Laterality: Right;  REPAIR OF RIGHT OROANTRAL FISTULA WITH BUCCAL FLAP   COLONOSCOPY     HERNIA REPAIR     MAXILLARY ANTROSTOMY Right 02/16/2024   Procedure: REVISION MAXILLARY ANTROSTOMY WITH TISSUE  REMOVAL;  Surgeon: Rush Coupe, MD;  Location: [facility] OR;  Service: ENT;  Laterality: Right;   NASAL SINUS SURGERY Right 02/16/2024   Procedure: RIGHT ENDOSCOPIC SINUS SURGERY;  Surgeon: Rush Coupe, MD;  Location: [facility] OR;  Service: ENT;  Laterality: Right;   NASAL TURBINATE REDUCTION Right 02/16/2024   Procedure: PARTIAL MAXILLECTOMY AND DENTAL EXTRACTIONS;  Surgeon: Rush Coupe, MD;  Location: [facility] OR;  Service: ENT;  Laterality: Right;   PORTACATH PLACEMENT Left 06/10/2018   Procedure: INSERTION PORT-A-CATH;  Surgeon: Awilda Bogus, MD;  Location: [facility];  Service: General;  Laterality: Left;   right arm surgery     bone cancer and rod placed   SINUS  ENDO WITH FUSION Bilateral 03/25/2023   Procedure: FUNCTIONAL ENDOSCOPIC SINUS SURGERY WITH FUSION AND NAVIGATION;  Surgeon: Rush Coupe, MD;  Location: [facility] OR;  Service: ENT;  Laterality: Bilateral;   SKIN FULL THICKNESS GRAFT Right 05/05/2024   Procedure: ORAL CAVITY ADJACENT TISSUE TRANSFER;  Surgeon: Rush Coupe, MD;  Location: [facility] OR;  Service: ENT;  Laterality: Right;  ORAL CAVITY ADJACENT TISSUE TRANSFER    Social History: Social History   Socioeconomic History   Marital status: Married    Spouse name: Not on file   Number of children: 3   Years of education: Not on file   Highest education level: Not on file  Occupational History   Not on file  Tobacco Use   Smoking status: Never   Smokeless tobacco: Former    Types: Designer, multimedia Use   Vaping status: Never Used  Substance and Sexual Activity   Alcohol  use: Yes    Alcohol /week: 24.0 standard drinks of alcohol     Types: 24 Cans of beer per week    Comment: 3-4 beers a week   Drug use: No   Sexual activity: Yes  Other Topics Concern   Not on file  Social History Narrative   Not on file   Social Drivers of Health   Financial Resource Strain: Low Risk  (11/26/2020)   Overall Financial Resource Strain (CARDIA)    Difficulty of Paying Living Expenses: Not hard at  all  Food Insecurity: Low Risk  (05/19/2024)   Received from [facility]   Hunger Vital Sign    Worried About Running Out of Food in the Last Year: Never true    Ran Out of Food in the Last Year: Never true  Transportation Needs: No Transportation Needs (05/19/2024)   Received from [facility]    In the past 12 months, has lack of reliable transportation kept you from medical appointments, meetings, work or from getting things needed for daily living? : No  Physical Activity: Inactive (11/26/2020)   Exercise Vital Sign    Days of Exercise per Week: 0 days    Minutes of Exercise per Session: 0 min  Stress: Stress Concern Present (11/26/2020)   [employer] of Occupational Health - Occupational Stress Questionnaire    Feeling of Stress : To some extent  Social Connections: Moderately Integrated (02/17/2024)   Social Connection and Isolation Panel [NHANES]    Frequency of Communication with Friends and Family: More than three times a week    Frequency of Social Gatherings with Friends and Family: Three times a week    Attends Religious Services: 1 to 4 times per year    Active Member of Clubs or Organizations: No    Attends Banker Meetings: Never    Marital Status: Married  Catering manager Violence: Not At Risk (02/17/2024)   Humiliation, Afraid, Rape, and Kick questionnaire    Fear of Current or Ex-Partner: No    Emotionally Abused: No    Physically Abused: No    Sexually Abused: No    Family History: Family History  Problem Relation Age of Onset   Stroke Mother    Cancer Maternal Aunt        cancer NOS; died in her 63s   Lung cancer Maternal Uncle        smoker    Current Medications:  Current Outpatient Medications:    acetaminophen  (TYLENOL ) 500 MG tablet, Take 1,000 mg by mouth every 8 (eight) hours as  needed for mild pain (pain score 1-3) or moderate pain (pain score 4-6)., Disp: , Rfl:    acyclovir  (ZOVIRAX ) 800 MG tablet, TAKE 1  TABLET TWICE DAILY, Disp: 180 tablet, Rfl: 3   albuterol  (VENTOLIN  HFA) 108 (90 Base) MCG/ACT inhaler, Inhale 2 puffs into the lungs every 6 (six) hours as needed for wheezing or shortness of breath., Disp: , Rfl:    ALPRAZolam  (XANAX ) 0.5 MG tablet, TAKE 1 TABLET BY MOUTH AT BEDTIME AS NEEDED FOR anxiety / SLEEP, Disp: 30 tablet, Rfl: 5   aspirin  EC 81 MG tablet, Take 81 mg by mouth daily. , Disp: , Rfl:    cetirizine  (ZYRTEC ) 10 MG tablet, Take 10 mg by mouth daily., Disp: , Rfl:    chlorhexidine  (PERIDEX ) 0.12 % solution, Swish and spit after meals and before bed (Total 4 times daily) for 10 days., Disp: 473 mL, Rfl: 1   DAPTOmycin  (CUBICIN ) 500 MG injection, Inject into the vein., Disp: , Rfl:    dexamethasone  (DECADRON ) 4 MG tablet, Take 20 mg by mouth every 30 (thirty) days., Disp: , Rfl:    folic acid  (FOLVITE ) 1 MG tablet, TAKE 1 TABLET BY MOUTH DAILY, Disp: 90 tablet, Rfl: 2   gabapentin  (NEURONTIN ) 300 MG capsule, Take 1 capsule (300 mg total) by mouth 3 (three) times daily., Disp: 180 capsule, Rfl: 2   metoprolol  succinate (TOPROL -XL) 100 MG 24 hr tablet, Take 100 mg by mouth in the morning., Disp: , Rfl:    pantoprazole  (PROTONIX ) 40 MG tablet, TAKE 1 TABLET EVERY OTHER DAY, Disp: 45 tablet, Rfl: 3   POMALYST  2 MG capsule, Take 1 capsule (2 mg total) by mouth daily., Disp: 21 capsule, Rfl: 0   tamoxifen  (NOLVADEX ) 20 MG tablet, TAKE 1 TABLET EVERY DAY, Disp: 90 tablet, Rfl: 3 No current facility-administered medications for this visit.  Facility-Administered Medications Ordered in Other Visits:    dextrose  5 % solution, , Intravenous, Continuous, Paulett Boros, MD, Stopped at 05/25/24 1458   ondansetron  (ZOFRAN ) 8 mg in sodium chloride  0.9 % 50 mL IVPB, 8 mg, Intravenous, Once, Lockamy, Randi L, NP-C   sodium chloride  flush (NS) 0.9 % injection 10 mL, 10 mL, Intracatheter, Once PRN, Lynsi Dooner, MD   Allergies: Allergies  Allergen Reactions   Cefepime       Suspected severe thrombocytopenia is a result of cefepime  induced antigen platelet destruction    REVIEW OF SYSTEMS:   Review of Systems  Constitutional:  Negative for chills, fatigue and fever.  HENT:   Positive for trouble swallowing. Negative for lump/mass, mouth sores, nosebleeds and sore throat.   Eyes:  Negative for eye problems.  Respiratory:  Negative for cough and shortness of breath.   Cardiovascular:  Negative for chest pain, leg swelling and palpitations.  Gastrointestinal:  Negative for abdominal pain, constipation, diarrhea, nausea and vomiting.  Genitourinary:  Negative for bladder incontinence, difficulty urinating, dysuria, frequency, hematuria and nocturia.   Musculoskeletal:  Negative for arthralgias, back pain, flank pain, myalgias and neck pain.  Skin:  Negative for itching and rash.  Neurological:  Negative for dizziness, headaches and numbness.       +tingling in the hands and feet  Hematological:  Does not bruise/bleed easily.  Psychiatric/Behavioral:  Negative for depression, sleep disturbance and suicidal ideas. The patient is not nervous/anxious.   All other systems reviewed and are negative.    VITALS:   Blood pressure 136/84, weight 192 lb 3.2 oz (87.2 kg).  Wt Readings from Last 3 Encounters:  05/25/24 192 lb 3.2 oz (87.2 kg)  05/05/24 190 lb (86.2 kg)  04/26/24 198 lb 6.6 oz (90 kg)    Body mass index is 28.38 kg/m.  Performance status (ECOG): 1 - Symptomatic but completely ambulatory  PHYSICAL EXAM:   Physical Exam Vitals and nursing note reviewed. Exam conducted with a chaperone present.  Constitutional:      Appearance: Normal appearance.  Cardiovascular:     Rate and Rhythm: Normal rate and regular rhythm.     Pulses: Normal pulses.     Heart sounds: Normal heart sounds.  Pulmonary:     Effort: Pulmonary effort is normal.     Breath sounds: Normal breath sounds.  Abdominal:     Palpations: Abdomen is soft. There is no hepatomegaly,  splenomegaly or mass.     Tenderness: There is no abdominal tenderness.  Musculoskeletal:     Right lower leg: No edema.     Left lower leg: No edema.  Lymphadenopathy:     Cervical: No cervical adenopathy.     Right cervical: No superficial, deep or posterior cervical adenopathy.    Left cervical: No superficial, deep or posterior cervical adenopathy.     Upper Body:     Right upper body: No supraclavicular or axillary adenopathy.     Left upper body: No supraclavicular or axillary adenopathy.  Neurological:     General: No focal deficit present.     Mental Status: He is alert and oriented to person, place, and time.  Psychiatric:        Mood and Affect: Mood normal.        Behavior: Behavior normal.     LABS:      Latest Ref Rng & Units 05/25/2024    9:22 AM 04/27/2024    8:13 AM 03/22/2024    1:15 PM  CBC  WBC 4.0 - 10.5 K/uL 5.6  7.3  5.5   Hemoglobin 13.0 - 17.0 g/dL 47.8  29.5  62.1   Hematocrit 39.0 - 52.0 % 34.6  33.5  32.2   Platelets 150 - 400 K/uL 160  147  203       Latest Ref Rng & Units 05/25/2024    9:22 AM 04/27/2024    8:13 AM 03/22/2024    1:15 PM  CMP  Glucose 70 - 99 mg/dL 95  95  96   BUN 8 - 23 mg/dL 12  16  12    Creatinine 0.61 - 1.24 mg/dL 3.08  6.57  8.46   Sodium 135 - 145 mmol/L 138  139  138   Potassium 3.5 - 5.1 mmol/L 4.1  4.1  4.5   Chloride 98 - 111 mmol/L 108  107  110   CO2 22 - 32 mmol/L 23  24  19    Calcium  8.9 - 10.3 mg/dL 7.8  8.3  7.8   Total Protein 6.5 - 8.1 g/dL 5.6  5.2  5.6   Total Bilirubin 0.0 - 1.2 mg/dL 0.5  0.6  0.6   Alkaline Phos 38 - 126 U/L 33  34  34   AST 15 - 41 U/L 18  17  19    ALT 0 - 44 U/L 12  11  14       No results found for: "CEA1", "CEA" / No results found for: "CEA1", "CEA" No results found for: "PSA1" No results found for: "NGE952" No results found for: "WUX324"  Lab Results  Component Value Date   TOTALPROTELP 5.1 (L) 03/22/2024  TOTALPROTELP 5.0 (L) 03/22/2024   ALBUMINELP 3.2 03/22/2024    A1GS 0.3 03/22/2024   A2GS 0.6 03/22/2024   BETS 0.8 03/22/2024   GAMS 0.2 (L) 03/22/2024   MSPIKE Not Observed 03/22/2024   SPEI Comment 03/22/2024   Lab Results  Component Value Date   TIBC 314 03/02/2023   TIBC 308 08/25/2022   TIBC 325 03/25/2020   FERRITIN 161 03/02/2023   FERRITIN 170 08/25/2022   FERRITIN 640 (H) 03/25/2020   IRONPCTSAT 11 (L) 03/02/2023   IRONPCTSAT 24 08/25/2022   IRONPCTSAT 10 (L) 03/25/2020   Lab Results  Component Value Date   LDH 131 11/18/2021   LDH 127 09/30/2021   LDH 102 08/05/2021     STUDIES:   No results found.

## 2024-05-26 ENCOUNTER — Other Ambulatory Visit: Payer: Self-pay

## 2024-05-26 LAB — PROTEIN ELECTROPHORESIS, SERUM
A/G Ratio: 1.6 (ref 0.7–1.7)
Albumin ELP: 3.1 g/dL (ref 2.9–4.4)
Alpha-1-Globulin: 0.2 g/dL (ref 0.0–0.4)
Alpha-2-Globulin: 0.6 g/dL (ref 0.4–1.0)
Beta Globulin: 0.7 g/dL (ref 0.7–1.3)
Gamma Globulin: 0.4 g/dL (ref 0.4–1.8)
Globulin, Total: 2 g/dL — ABNORMAL LOW (ref 2.2–3.9)
Total Protein ELP: 5.1 g/dL — ABNORMAL LOW (ref 6.0–8.5)

## 2024-05-26 LAB — KAPPA/LAMBDA LIGHT CHAINS
Kappa free light chain: 1 mg/L — ABNORMAL LOW (ref 3.3–19.4)
Kappa, lambda light chain ratio: >0.67 (ref 0.26–1.65)
Lambda free light chains: <1.5 mg/L — ABNORMAL LOW (ref 5.7–26.3)

## 2024-05-29 LAB — IMMUNOFIXATION ELECTROPHORESIS
IgA: 5 mg/dL — ABNORMAL LOW (ref 61–437)
IgG (Immunoglobin G), Serum: 520 mg/dL — ABNORMAL LOW (ref 603–1613)
IgM (Immunoglobulin M), Srm: <5 mg/dL — ABNORMAL LOW (ref 15–143)
Total Protein ELP: 5.2 g/dL — ABNORMAL LOW (ref 6.0–8.5)

## 2024-06-01 ENCOUNTER — Other Ambulatory Visit: Payer: Self-pay | Admitting: *Deleted

## 2024-06-01 MED ORDER — POMALYST 2 MG PO CAPS: 2.0000 mg | ORAL_CAPSULE | Freq: Every day | ORAL | 0 refills | Status: AC

## 2024-06-01 NOTE — Telephone Encounter (Signed)
 Prescription renewed for Pomalyst  2 mg daily for 21 days on and 7 days off.  Patient is tolerating and is to continue therapy.

## 2024-06-08 ENCOUNTER — Other Ambulatory Visit: Payer: Self-pay | Admitting: Hematology

## 2024-06-08 DIAGNOSIS — C50922 Malignant neoplasm of unspecified site of left male breast: Secondary | ICD-10-CM

## 2024-06-08 DIAGNOSIS — C9000 Multiple myeloma not having achieved remission: Secondary | ICD-10-CM

## 2024-06-10 ENCOUNTER — Other Ambulatory Visit: Payer: Self-pay

## 2024-06-12 DIAGNOSIS — M272 Inflammatory conditions of jaws: Secondary | ICD-10-CM | POA: Diagnosis not present

## 2024-06-12 DIAGNOSIS — T171XXA Foreign body in nostril, initial encounter: Secondary | ICD-10-CM | POA: Diagnosis not present

## 2024-06-12 DIAGNOSIS — J3489 Other specified disorders of nose and nasal sinuses: Secondary | ICD-10-CM | POA: Diagnosis not present

## 2024-06-12 DIAGNOSIS — J320 Chronic maxillary sinusitis: Secondary | ICD-10-CM | POA: Diagnosis not present

## 2024-06-17 ENCOUNTER — Other Ambulatory Visit: Payer: Self-pay

## 2024-06-22 ENCOUNTER — Inpatient Hospital Stay: Attending: Hematology

## 2024-06-22 ENCOUNTER — Inpatient Hospital Stay (HOSPITAL_BASED_OUTPATIENT_CLINIC_OR_DEPARTMENT_OTHER): Admitting: Hematology

## 2024-06-22 ENCOUNTER — Inpatient Hospital Stay

## 2024-06-22 VITALS — BP 156/77 | HR 53 | Temp 96.0°F | Resp 19

## 2024-06-22 VITALS — BP 153/77

## 2024-06-22 DIAGNOSIS — Z9484 Stem cells transplant status: Secondary | ICD-10-CM | POA: Insufficient documentation

## 2024-06-22 DIAGNOSIS — R531 Weakness: Secondary | ICD-10-CM | POA: Insufficient documentation

## 2024-06-22 DIAGNOSIS — Z79899 Other long term (current) drug therapy: Secondary | ICD-10-CM | POA: Diagnosis not present

## 2024-06-22 DIAGNOSIS — Z7961 Long term (current) use of immunomodulator: Secondary | ICD-10-CM | POA: Diagnosis not present

## 2024-06-22 DIAGNOSIS — Z5112 Encounter for antineoplastic immunotherapy: Secondary | ICD-10-CM | POA: Diagnosis present

## 2024-06-22 DIAGNOSIS — E611 Iron deficiency: Secondary | ICD-10-CM | POA: Diagnosis not present

## 2024-06-22 DIAGNOSIS — Z853 Personal history of malignant neoplasm of breast: Secondary | ICD-10-CM | POA: Insufficient documentation

## 2024-06-22 DIAGNOSIS — C9002 Multiple myeloma in relapse: Secondary | ICD-10-CM | POA: Insufficient documentation

## 2024-06-22 DIAGNOSIS — C9000 Multiple myeloma not having achieved remission: Secondary | ICD-10-CM

## 2024-06-22 DIAGNOSIS — C50122 Malignant neoplasm of central portion of left male breast: Secondary | ICD-10-CM

## 2024-06-22 DIAGNOSIS — K219 Gastro-esophageal reflux disease without esophagitis: Secondary | ICD-10-CM | POA: Insufficient documentation

## 2024-06-22 DIAGNOSIS — G629 Polyneuropathy, unspecified: Secondary | ICD-10-CM | POA: Insufficient documentation

## 2024-06-22 DIAGNOSIS — D800 Hereditary hypogammaglobulinemia: Secondary | ICD-10-CM | POA: Insufficient documentation

## 2024-06-22 DIAGNOSIS — I129 Hypertensive chronic kidney disease with stage 1 through stage 4 chronic kidney disease, or unspecified chronic kidney disease: Secondary | ICD-10-CM | POA: Diagnosis not present

## 2024-06-22 DIAGNOSIS — Z801 Family history of malignant neoplasm of trachea, bronchus and lung: Secondary | ICD-10-CM | POA: Diagnosis not present

## 2024-06-22 DIAGNOSIS — Z923 Personal history of irradiation: Secondary | ICD-10-CM | POA: Diagnosis not present

## 2024-06-22 DIAGNOSIS — D508 Other iron deficiency anemias: Secondary | ICD-10-CM

## 2024-06-22 DIAGNOSIS — Z9221 Personal history of antineoplastic chemotherapy: Secondary | ICD-10-CM | POA: Diagnosis not present

## 2024-06-22 DIAGNOSIS — D702 Other drug-induced agranulocytosis: Secondary | ICD-10-CM

## 2024-06-22 DIAGNOSIS — C7951 Secondary malignant neoplasm of bone: Secondary | ICD-10-CM

## 2024-06-22 LAB — CBC WITH DIFFERENTIAL/PLATELET
Abs Immature Granulocytes: 0.02 10*3/uL (ref 0.00–0.07)
Basophils Absolute: 0 10*3/uL (ref 0.0–0.1)
Basophils Relative: 1 %
Eosinophils Absolute: 0.3 10*3/uL (ref 0.0–0.5)
Eosinophils Relative: 4 %
HCT: 34.3 % — ABNORMAL LOW (ref 39.0–52.0)
Hemoglobin: 11.4 g/dL — ABNORMAL LOW (ref 13.0–17.0)
Immature Granulocytes: 0 %
Lymphocytes Relative: 15 %
Lymphs Abs: 0.9 10*3/uL (ref 0.7–4.0)
MCH: 35.3 pg — ABNORMAL HIGH (ref 26.0–34.0)
MCHC: 33.2 g/dL (ref 30.0–36.0)
MCV: 106.2 fL — ABNORMAL HIGH (ref 80.0–100.0)
Monocytes Absolute: 0.3 10*3/uL (ref 0.1–1.0)
Monocytes Relative: 5 %
Neutro Abs: 4.6 10*3/uL (ref 1.7–7.7)
Neutrophils Relative %: 75 %
Platelets: 148 10*3/uL — ABNORMAL LOW (ref 150–400)
RBC: 3.23 MIL/uL — ABNORMAL LOW (ref 4.22–5.81)
RDW: 14.2 % (ref 11.5–15.5)
WBC: 6.1 10*3/uL (ref 4.0–10.5)
nRBC: 0 % (ref 0.0–0.2)

## 2024-06-22 LAB — COMPREHENSIVE METABOLIC PANEL WITH GFR
ALT: 10 U/L (ref 0–44)
AST: 19 U/L (ref 15–41)
Albumin: 3.1 g/dL — ABNORMAL LOW (ref 3.5–5.0)
Alkaline Phosphatase: 33 U/L — ABNORMAL LOW (ref 38–126)
Anion gap: 7 (ref 5–15)
BUN: 15 mg/dL (ref 8–23)
CO2: 24 mmol/L (ref 22–32)
Calcium: 8 mg/dL — ABNORMAL LOW (ref 8.9–10.3)
Chloride: 109 mmol/L (ref 98–111)
Creatinine, Ser: 1.29 mg/dL — ABNORMAL HIGH (ref 0.61–1.24)
GFR, Estimated: 57 mL/min — ABNORMAL LOW (ref >60)
Glucose, Bld: 103 mg/dL — ABNORMAL HIGH (ref 70–99)
Potassium: 4.5 mmol/L (ref 3.5–5.1)
Sodium: 140 mmol/L (ref 135–145)
Total Bilirubin: 0.5 mg/dL (ref 0.0–1.2)
Total Protein: 5.8 g/dL — ABNORMAL LOW (ref 6.5–8.1)

## 2024-06-22 MED ORDER — SODIUM CHLORIDE 0.9% FLUSH
10.0000 mL | Freq: Once | INTRAVENOUS | Status: AC
  Administered 2024-06-22: 10 mL via INTRAVENOUS

## 2024-06-22 MED ORDER — HEPARIN SOD (PORK) LOCK FLUSH 100 UNIT/ML IV SOLN
500.0000 [IU] | Freq: Once | INTRAVENOUS | Status: AC
  Administered 2024-06-22: 500 [IU] via INTRAVENOUS

## 2024-06-22 MED ORDER — ACETAMINOPHEN 325 MG PO TABS
650.0000 mg | ORAL_TABLET | Freq: Once | ORAL | Status: DC
Start: 2024-06-22 — End: 2024-06-22

## 2024-06-22 MED ORDER — LORATADINE 10 MG PO TABS: 10.0000 mg | ORAL_TABLET | Freq: Once | ORAL | Status: DC

## 2024-06-22 MED ORDER — DEXAMETHASONE 4 MG PO TABS: 20.0000 mg | ORAL_TABLET | Freq: Once | ORAL | Status: DC

## 2024-06-22 MED ORDER — CETIRIZINE HCL 10 MG/ML IV SOLN
10.0000 mg | Freq: Once | INTRAVENOUS | Status: AC
  Administered 2024-06-22: 10 mg via INTRAVENOUS
  Filled 2024-06-22: qty 1

## 2024-06-22 MED ORDER — HEPARIN SOD (PORK) LOCK FLUSH 100 UNIT/ML IV SOLN
500.0000 [IU] | Freq: Once | INTRAVENOUS | Status: AC | PRN
  Administered 2024-06-22: 500 [IU]

## 2024-06-22 MED ORDER — DARATUMUMAB-HYALURONIDASE-FIHJ 1800-30000 MG-UT/15ML ~~LOC~~ SOLN
1800.0000 mg | Freq: Once | SUBCUTANEOUS | Status: AC
  Administered 2024-06-22: 1800 mg via SUBCUTANEOUS
  Filled 2024-06-22: qty 15

## 2024-06-22 MED ORDER — IMMUNE GLOBULIN (HUMAN) 10 GM/100ML IV SOLN
40.0000 g | Freq: Once | INTRAVENOUS | Status: AC
  Administered 2024-06-22: 40 g via INTRAVENOUS
  Filled 2024-06-22: qty 400

## 2024-06-22 MED ORDER — FAMOTIDINE IN NACL 20-0.9 MG/50ML-% IV SOLN
20.0000 mg | Freq: Once | INTRAVENOUS | Status: AC
  Administered 2024-06-22: 20 mg via INTRAVENOUS
  Filled 2024-06-22: qty 50

## 2024-06-22 MED ORDER — ACETAMINOPHEN 325 MG PO TABS: 650.0000 mg | ORAL_TABLET | Freq: Once | ORAL | Status: DC

## 2024-06-22 MED ORDER — DEXTROSE 5 % IV SOLN: INTRAVENOUS | Status: DC

## 2024-06-22 NOTE — Progress Notes (Signed)
 Patient tolerated Daratumumab  injection with no complaints voiced.  See MAR for details.  Labs reviewed. Injection site clean and dry with no bruising or swelling noted at site.  Band aid applied.    Patient tolerated IVIG well with no complaints voiced.  Patient left ambulatory in stable condition.  Vital signs stable at discharge.  Follow up as scheduled. Vss with discharge and left in satisfactory condition with no s/s of distress noted.    Johnny Lucas

## 2024-06-22 NOTE — Patient Instructions (Signed)
 [facility] - A DEPT OF [facility]  Discharge Instructions: Thank you for choosing [facility] to provide your oncology and hematology care.  If you have a lab appointment with the Cancer Center - please note that after April 8th, 2024, all labs will be drawn in the cancer center.  You do not have to check in or register with the main entrance as you have in the past but will complete your check-in in the cancer center.  Wear comfortable clothing and clothing appropriate for easy access to any Portacath or PICC line.   We strive to give you quality time with your provider. You may need to reschedule your appointment if you arrive late (15 or more minutes).  Arriving late affects you and other patients whose appointments are after yours.  Also, if you miss three or more appointments without notifying the office, you may be dismissed from the clinic at the provider's discretion.      For prescription refill requests, have your pharmacy contact our office and allow 72 hours for refills to be completed.    Today you received the following chemotherapy and/or immunotherapy agents darzalex  and Immune globulin      To help prevent nausea and vomiting after your treatment, we encourage you to take your nausea medication as directed.  BELOW ARE SYMPTOMS THAT SHOULD BE REPORTED IMMEDIATELY: *FEVER GREATER THAN 100.4 F (38 C) OR HIGHER *CHILLS OR SWEATING *NAUSEA AND VOMITING THAT IS NOT CONTROLLED WITH YOUR NAUSEA MEDICATION *UNUSUAL SHORTNESS OF BREATH *UNUSUAL BRUISING OR BLEEDING *URINARY PROBLEMS (pain or burning when urinating, or frequent urination) *BOWEL PROBLEMS (unusual diarrhea, constipation, pain near the anus) TENDERNESS IN MOUTH AND THROAT WITH OR WITHOUT PRESENCE OF ULCERS (sore throat, sores in mouth, or a toothache) UNUSUAL RASH, SWELLING OR PAIN  UNUSUAL VAGINAL DISCHARGE OR ITCHING   Items with * indicate a potential emergency and  should be followed up as soon as possible or go to the Emergency Department if any problems should occur.  Please show the CHEMOTHERAPY ALERT CARD or IMMUNOTHERAPY ALERT CARD at check-in to the Emergency Department and triage nurse.  Should you have questions after your visit or need to cancel or reschedule your appointment, please contact [facility] [place] - A DEPT OF [facility] [phone]  and follow the prompts.  Office hours are 8:00 a.m. to 4:30 p.m. Monday - Friday. Please note that voicemails left after 4:00 p.m. may not be returned until the following business day.  We are closed weekends and major holidays. You have access to a nurse at all times for urgent questions. Please call the main number to the clinic [phone] and follow the prompts.  For any non-urgent questions, you may also contact your provider using MyChart. We now offer e-Visits for anyone 22 and older to request care online for non-urgent symptoms. For details visit [email or web address].   Also download the MyChart app! Go to the app store, search MyChart, open the app, select , and log in with your MyChart username and password.

## 2024-06-22 NOTE — Progress Notes (Signed)
 [facility] [address] [name]    Clinic Day:  06/22/2024  Referring physician: Rogers Lucas, Johnny Johnny  Patient Care Team: Johnny Johnny, Johnny Johnny as PCP - General (Hematology) Johnny Johnny, Johnny Johnny as Consulting Physician (Oncology)   ASSESSMENT & PLAN:   Assessment: 1.  Relapsed multiple myeloma: -Autologous stem cell transplant on 10/08/2017 -Post transplant consolidation with CyCarD for 2 cycles with M spike undetectable. -Maintenance therapy with carfilzomib  70 mg per metered square days 1 and 15 every 28 days from 04/20/2018, dose reduced to 35 mg per metered square on 01/05/2019, titrated up to 56 mg per metered square on 01/09/2020. 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 -PET scan on 02/18/2020 showed new left supraclavicular lymph node at the thoracic inlet, SUV 14.2.  Multifocal osseous lesions largely improved, though residual lesions noted including right acromion, left acromion, thyroid  cartilage, right sternum.  Additional lesions throughout the sternum, bilateral ribs, thoracolumbar spine, bilateral pelvis have resolved. -BMBX on 02/14/2020 shows plasma cell myeloma, 60-70% of cells.  FISH for myeloma was negative.  Cytogenetics was normal. -4 cycles of daratumumab , bortezomib  and dexamethasone  from 02/28/2020 through 04/29/2020. -Daratumumab , pomalidomide  and dexamethasone  started on 05/28/2020.  He is receiving daratumumab  every 2 weeks, Pomalyst  3 mg days 1-21, dexamethasone  20 mg weekly. -Pomalidomide  dose reduced to 2 mg days 1-21 around the first week of July 2021, due to severe weakness. - Pomalidomide  and Darzalex  held from 02/16/2024 through 04/26/2024 due to hospitalization, right partial maxillectomy and extraction of remaining right maxillary molar, first premolar and canine, required prolonged antibiotics    Plan: 1.  Relapsed IgG kappa multiple myeloma: - He is continuing to  tolerate Darzalex  very well.  Pomalyst  has been on hold. - Reviewed myeloma labs from 05/25/2024.  M spike is negative.  Immunofixation is normal.  Free light chain ratio is more than 0.67, stable.  Kappa light chains are at 1. - As his myeloma labs are stable, I will continue with single agent daratumumab .  He is seeing reconstructive surgeon Dr. Murry at [facility].  Will see him back in 4 weeks for follow-up.  2.  Myeloma bone disease: - Continue to hold denosumab  indefinitely.   3.  Macrocytic anemia: - Multifactorial from CKD, functional iron deficiency and myelosuppression.  Latest hemoglobin is 11.4.  Will give Retacrit  if hemoglobin drops below 9.   4.  ID prophylaxis: - Continue acyclovir  twice daily.   5.  Peripheral neuropathy: - Continue gabapentin  600 mg 3 times daily.   6.  History of breast cancer: - Continue tamoxifen  daily.  7.  Hypogammaglobulinemia: - He has received 2 doses of IVIG starting on 04/27/2024.  Nadir IgG is 520 after one dose.    Orders Placed This Encounter  Procedures   Immunofixation electrophoresis   Protein electrophoresis, serum   Kappa/lambda light chains     I,Johnny Johnny,acting as a scribe for Johnny Johnny, Johnny Johnny.,have documented all relevant documentation on the behalf of Johnny Johnny, Johnny Johnny,as directed by  Johnny Johnny, Johnny Johnny while in the presence of Johnny Johnny, Johnny Johnny.  I, Johnny Johnny Johnny Johnny, have reviewed the above documentation for accuracy and completeness, and I agree with the above.     Johnny Johnny, Johnny Johnny   6/26/20253:38 PM  CHIEF COMPLAINT:   Diagnosis: multiple myeloma    Cancer Staging  Multiple myeloma ([name] Staging form: Plasma Cell Myeloma and Plasma Cell Disorders, AJCC 8th Edition - Clinical stage from 05/03/2017: Beta-2 -microglobulin (  mg/L): 3.5, Albumin  (g/dL): 3.4, ISS: Stage II, High-risk cytogenetics: Absent, LDH: Not assessed - Signed by Johnny Debby RAMAN, PA-C on 05/03/2017     Prior Therapy: 1. RVD x 6 cycles from 03/01/2017 to 06/29/2017. 2. Stem cell transplant on 09/30/2017. 3. Carfilzomib  and cyclophosphamide  x 2 cycles from 02/14/2018 to 03/29/2018. 4. Carfilzomib  x 22 cycles from 04/20/2018 to 01/23/2020.  Current Therapy:  Darzalex  Faspro monthly; Pomalyst  2 mg 3/4 weeks; Xgeva  monthly    HISTORY OF PRESENT ILLNESS:   Oncology History  Breast cancer, male ([name]  12/31/2009 Initial Biopsy   Biopsy of L breast    12/31/2009 Pathology Results   Invasive ductal carcinoma, ER/PR+, HER 2 negative   12/31/2009 Imaging   Ultrasound showing a 2.43 x 1.85 x 3 cm hypoechoic spiculated mass in the 12 o clock L breast retroareolar region   01/01/2010 -  Anti-estrogen oral therapy   Tamoxifen  20 mg daily   01/06/2010 Imaging   Bone scan abnormal uptake in the diaphysis of the R humerus, abnormal in the R third, fifth and sixth ribs, lesion also noted in the sternum.   02/03/2010 Surgery   Rod placement and fixation of R humerus by Dr. Camella   02/05/2010 - 02/18/2010 Radiation Therapy   30Gy in 10 fractions of 3 Gy per fraction to R pathologic fracture   03/11/2010 -  Chemotherapy   Denosumab  monthly, now every 3 months. Started at [facility]    06/09/2016 Imaging   Three hypermetabolic osseous lesions in the sternum, left ilium and right ilium, as discussed above, likely represent osseous metastases. At this time, these are not recognizable on the CT images. 2. No extra skeletal metastatic disease identified in the neck, chest, abdomen or pelvis.   10/13/2016 Progression   PET shows various new and enlarging osseous metastatic lesions with no definite extra osseous metastatic disease currently identified.    12/31/2016 Progression   1. Multifocal hypermetabolic osseous metastases throughout the axial and proximal appendicular skeleton, which are increased in size, number and metabolism since 10/13/2016 PET-CT. 2. New focal hypermetabolism in the upper left  thyroid  cartilage with associated subtle sclerotic change in the CT images, suspect a thyroid  cartilage metastasis. 3. No additional sites of hypermetabolic metastatic disease. 4. Chronic right mastoid sinusitis. 5. Aortic atherosclerosis.  One vessel coronary atherosclerosis.   Multiple myeloma ([name]  02/12/2017 Bone Marrow Biopsy   The marrow was variably cellular with large peritrabecular aggregates of kappa restricted plasma cells (66% by aspirate, 30% by Cd138). Cytogenetics +11.    03/01/2017 - 06/29/2017 Chemotherapy   RVD    05/26/2017 Bone Marrow Biopsy   Performed at [facility]:  Plasma cell myeloma in a 30% cellular marrow with decreased trilineage hematopoiesis and 42% kappa light chain restricted plasma cells on the aspirate smears and large aggregates on the core biopsy.     07/12/2017 - 09/01/2017 Chemotherapy   3 cycles of carfizolmib/cyclophosphamide /dexamethasone      10/08/2017 Bone Marrow Transplant   Autotransplant at [facility]   02/28/2020 - 09/01/2022 Chemotherapy   Patient is on Treatment Plan : Daratumumab  q28d     09/01/2022 -  Chemotherapy   Patient is on Treatment Plan : MYELOMA Daratumumab  Faspro Subq q28d     Multiple myeloma not having achieved remission ([name]  06/29/2017 Initial Diagnosis   Multiple myeloma not having achieved remission ([name]   02/28/2020 - 09/01/2022 Chemotherapy   Patient is on Treatment Plan : Daratumumab  q28d     09/01/2022 -  Chemotherapy   Patient is on Treatment Plan : MYELOMA Daratumumab  Faspro Subq q28d        INTERVAL HISTORY:   Johnny Johnny is a 78 y.o. male seen for follow-up of multiple myeloma.  He was last seen by me on 05/25/24.  Today, he states that he is doing well overall. His appetite level is at 80%. His energy level is at 75%. Keigo has an appointment in 2 weeks at [facility] for consult of reconstructive surgery with Dr. Patwa for the hole in his sinus. If surgery is not possible he will have a local  surgeon fit him for a prosthetic to the area.   PAST MEDICAL HISTORY:   Past Medical History: Past Medical History:  Diagnosis Date   Anxiety    Bone metastases 09/10/2016   Breast cancer, male [facility]) 2011   Stage IV breast cancer; radiation and tamoxifen   Overview:  Left breast ca with mets bone  Overview:  METS TO BONE   Bronchitis    uses inhaler   Cough    uses inhaler   GERD (gastroesophageal reflux disease)    Headache    History of blood transfusion    Hypertension    Macular degeneration    bilateral   Memory loss    mild   Multiple myeloma ([name] 02/18/2017   Peripheral neuropathy    feet   Pneumonia    mild case 1-2 yrs ago (2023 or 2024)    Surgical History: Past Surgical History:  Procedure Laterality Date   BACK SURGERY     C2-C3 fusion   BREAST BIOPSY Left    cancer   CATARACT EXTRACTION W/PHACO Left 02/03/2019   Procedure: CATARACT EXTRACTION PHACO AND INTRAOCULAR LENS PLACEMENT (IOC);  Surgeon: Harrie Agent, Johnny Johnny;  Location: [facility];  Service: Ophthalmology;  Laterality: Left;  CDE: 15.89   CATARACT EXTRACTION W/PHACO Right 07/25/2021   Procedure: CATARACT EXTRACTION PHACO AND INTRAOCULAR LENS PLACEMENT (IOC);  Surgeon: Harrie Agent, Johnny Johnny;  Location: [facility];  Service: Ophthalmology;  Laterality: Right;  CDE   26.71   CLOSURE ORAL ANTRAL FISTULA Right 05/05/2024   Procedure: REPAIR OF RIGHT OROANTRAL FISTULA WITH BUCCAL FLAP;  Surgeon: Luciano Standing, Johnny Johnny;  Location: [facility] OR;  Service: ENT;  Laterality: Right;  REPAIR OF RIGHT OROANTRAL FISTULA WITH BUCCAL FLAP   COLONOSCOPY     HERNIA REPAIR     MAXILLARY ANTROSTOMY Right 02/16/2024   Procedure: REVISION MAXILLARY ANTROSTOMY WITH TISSUE REMOVAL;  Surgeon: Luciano Standing, Johnny Johnny;  Location: [facility] OR;  Service: ENT;  Laterality: Right;   NASAL SINUS SURGERY Right 02/16/2024   Procedure: RIGHT ENDOSCOPIC SINUS SURGERY;  Surgeon: Luciano Standing, Johnny Johnny;  Location: [facility] OR;  Service: ENT;  Laterality: Right;   NASAL TURBINATE  REDUCTION Right 02/16/2024   Procedure: PARTIAL MAXILLECTOMY AND DENTAL EXTRACTIONS;  Surgeon: Luciano Standing, Johnny Johnny;  Location: [facility] OR;  Service: ENT;  Laterality: Right;   PORTACATH PLACEMENT Left 06/10/2018   Procedure: INSERTION PORT-A-CATH;  Surgeon: Kallie Manuelita BROCKS, Johnny Johnny;  Location: [facility];  Service: General;  Laterality: Left;   right arm surgery     bone cancer and rod placed   SINUS ENDO WITH FUSION Bilateral 03/25/2023   Procedure: FUNCTIONAL ENDOSCOPIC SINUS SURGERY WITH FUSION AND NAVIGATION;  Surgeon: Luciano Standing, Johnny Johnny;  Location: [facility] OR;  Service: ENT;  Laterality: Bilateral;   SKIN FULL THICKNESS GRAFT Right 05/05/2024   Procedure: ORAL CAVITY ADJACENT TISSUE TRANSFER;  Surgeon: Luciano Standing, Johnny Johnny;  Location: [facility] OR;  Service:  ENT;  Laterality: Right;  ORAL CAVITY ADJACENT TISSUE TRANSFER    Social History: Social History   Socioeconomic History   Marital status: Married    Spouse name: Not on file   Number of children: 3   Years of education: Not on file   Highest education level: Not on file  Occupational History   Not on file  Tobacco Use   Smoking status: Never   Smokeless tobacco: Former    Types: Engineer, drilling   Vaping status: Never Used  Substance and Sexual Activity   Alcohol  use: Yes    Alcohol /week: 24.0 standard drinks of alcohol     Types: 24 Cans of beer per week    Comment: 3-4 beers a week   Drug use: No   Sexual activity: Yes  Other Topics Concern   Not on file  Social History Narrative   Not on file   Social Drivers of Health   Financial Resource Strain: Low Risk  (11/26/2020)   Overall Financial Resource Strain (CARDIA)    Difficulty of Paying Living Expenses: Not hard at all  Food Insecurity: Low Risk  (05/19/2024)   Received from [facility]   Hunger Vital Sign    Within the past 12 months, you worried that your food would run out before you got money to buy more: Never true    Within the past 12 months, the food you bought just didn't  last and you didn't have money to get more. : Never true  Transportation Needs: No Transportation Needs (05/19/2024)   Received from [facility]    In the past 12 months, has lack of reliable transportation kept you from medical appointments, meetings, work or from getting things needed for daily living? : No  Physical Activity: Inactive (11/26/2020)   Exercise Vital Sign    Days of Exercise per Week: 0 days    Minutes of Exercise per Session: 0 min  Stress: Stress Concern Present (11/26/2020)   [employer] of Occupational Health - Occupational Stress Questionnaire    Feeling of Stress : To some extent  Social Connections: Moderately Integrated (02/17/2024)   Social Connection and Isolation Panel    Frequency of Communication with Friends and Family: More than three times a week    Frequency of Social Gatherings with Friends and Family: Three times a week    Attends Religious Services: 1 to 4 times per year    Active Member of Clubs or Organizations: No    Attends Banker Meetings: Never    Marital Status: Married  Catering manager Violence: Not At Risk (02/17/2024)   Humiliation, Afraid, Rape, and Kick questionnaire    Fear of Current or Ex-Partner: No    Emotionally Abused: No    Physically Abused: No    Sexually Abused: No    Family History: Family History  Problem Relation Age of Onset   Stroke Mother    Cancer Maternal Aunt        cancer NOS; died in her 67s   Lung cancer Maternal Uncle        smoker    Current Medications:  Current Outpatient Medications:    acetaminophen  (TYLENOL ) 500 MG tablet, Take 1,000 mg by mouth every 8 (eight) hours as needed for mild pain (pain score 1-3) or moderate pain (pain score 4-6)., Disp: , Rfl:    acyclovir  (ZOVIRAX ) 800 MG tablet, TAKE 1 TABLET TWICE DAILY, Disp: 180 tablet, Rfl: 3  albuterol  (VENTOLIN  HFA) 108 (90 Base) MCG/ACT inhaler, Inhale 2 puffs into the lungs every 6 (six) hours as needed  for wheezing or shortness of breath., Disp: , Rfl:    ALPRAZolam  (XANAX ) 0.5 MG tablet, TAKE 1 TABLET BY MOUTH AT BEDTIME AS NEEDED FOR anxiety / SLEEP, Disp: 30 tablet, Rfl: 5   aspirin  EC 81 MG tablet, Take 81 mg by mouth daily. , Disp: , Rfl:    cetirizine  (ZYRTEC ) 10 MG tablet, Take 10 mg by mouth daily., Disp: , Rfl:    chlorhexidine  (PERIDEX ) 0.12 % solution, Swish and spit after meals and before bed (Total 4 times daily) for 10 days., Disp: 473 mL, Rfl: 1   DAPTOmycin  (CUBICIN ) 500 MG injection, Inject into the vein., Disp: , Rfl:    dexamethasone  (DECADRON ) 4 MG tablet, Take 20 mg by mouth every 30 (thirty) days., Disp: , Rfl:    folic acid  (FOLVITE ) 1 MG tablet, TAKE 1 TABLET BY MOUTH DAILY, Disp: 90 tablet, Rfl: 2   gabapentin  (NEURONTIN ) 300 MG capsule, Take 1 capsule (300 mg total) by mouth 3 (three) times daily., Disp: 180 capsule, Rfl: 2   metoprolol  succinate (TOPROL -XL) 100 MG 24 hr tablet, Take 100 mg by mouth in the morning., Disp: , Rfl:    mupirocin  ointment (BACTROBAN ) 2 %, Apply topically., Disp: , Rfl:    pantoprazole  (PROTONIX ) 40 MG tablet, TAKE 1 TABLET EVERY OTHER DAY, Disp: 45 tablet, Rfl: 3   POMALYST  2 MG capsule, Take 1 capsule (2 mg total) by mouth daily., Disp: 21 capsule, Rfl: 0   tamoxifen  (NOLVADEX ) 20 MG tablet, TAKE 1 TABLET EVERY DAY, Disp: 90 tablet, Rfl: 3 No current facility-administered medications for this visit.  Facility-Administered Medications Ordered in Other Visits:    acetaminophen  (TYLENOL ) tablet 650 mg, 650 mg, Oral, Once, Johnny Johnny, Johnny Johnny   dextrose  5 % solution, , Intravenous, Continuous, Rajon Bisig, Johnny Johnny, Stopped at 06/22/24 1346   ondansetron  (ZOFRAN ) 8 mg in sodium chloride  0.9 % 50 mL IVPB, 8 mg, Intravenous, Once, Lockamy, Randi L, NP-C   sodium chloride  flush (NS) 0.9 % injection 10 mL, 10 mL, Intracatheter, Once PRN, Enola Siebers, Johnny Johnny   Allergies: Allergies  Allergen Reactions   Cefepime      Suspected  severe thrombocytopenia is a result of cefepime  induced antigen platelet destruction    REVIEW OF SYSTEMS:   Review of Systems  Constitutional:  Negative for chills, fatigue and fever.  HENT:   Positive for trouble swallowing. Negative for lump/mass, mouth sores, nosebleeds and sore throat.   Eyes:  Negative for eye problems.  Respiratory:  Negative for cough and shortness of breath.   Cardiovascular:  Negative for chest pain, leg swelling and palpitations.  Gastrointestinal:  Negative for abdominal pain, constipation, diarrhea, nausea and vomiting.  Genitourinary:  Negative for bladder incontinence, difficulty urinating, dysuria, frequency, hematuria and nocturia.   Musculoskeletal:  Negative for arthralgias, back pain, flank pain, myalgias and neck pain.  Skin:  Negative for itching and rash.  Neurological:  Positive for numbness (and tingling in feet). Negative for dizziness and headaches.  Hematological:  Does not bruise/bleed easily.  Psychiatric/Behavioral:  Negative for depression, sleep disturbance and suicidal ideas. The patient is not nervous/anxious.   All other systems reviewed and are negative.    VITALS:   Blood pressure (!) 153/77.  Wt Readings from Last 3 Encounters:  06/22/24 195 lb 8.8 oz (88.7 kg)  05/25/24 192 lb 3.2 oz (87.2 kg)  05/05/24 190 lb (86.2  kg)    There is no height or weight on file to calculate BMI.  Performance status (ECOG): 1 - Symptomatic but completely ambulatory  PHYSICAL EXAM:   Physical Exam Vitals and nursing note reviewed. Exam conducted with a chaperone present.  Constitutional:      Appearance: Normal appearance.   Cardiovascular:     Rate and Rhythm: Normal rate and regular rhythm.     Pulses: Normal pulses.     Heart sounds: Normal heart sounds.  Pulmonary:     Effort: Pulmonary effort is normal.     Breath sounds: Normal breath sounds.  Abdominal:     Palpations: Abdomen is soft. There is no hepatomegaly, splenomegaly or  mass.     Tenderness: There is no abdominal tenderness.   Musculoskeletal:     Right lower leg: No edema.     Left lower leg: No edema.  Lymphadenopathy:     Cervical: No cervical adenopathy.     Right cervical: No superficial, deep or posterior cervical adenopathy.    Left cervical: No superficial, deep or posterior cervical adenopathy.     Upper Body:     Right upper body: No supraclavicular or axillary adenopathy.     Left upper body: No supraclavicular or axillary adenopathy.   Neurological:     General: No focal deficit present.     Mental Status: He is alert and oriented to person, place, and time.   Psychiatric:        Mood and Affect: Mood normal.        Behavior: Behavior normal.     LABS:      Latest Ref Rng & Units 06/22/2024    8:34 AM 05/25/2024    9:22 AM 04/27/2024    8:13 AM  CBC  WBC 4.0 - 10.5 K/uL 6.1  5.6  7.3   Hemoglobin 13.0 - 17.0 g/dL 88.5  88.5  89.2   Hematocrit 39.0 - 52.0 % 34.3  34.6  33.5   Platelets 150 - 400 K/uL 148  160  147       Latest Ref Rng & Units 06/22/2024    8:34 AM 05/25/2024    9:22 AM 04/27/2024    8:13 AM  CMP  Glucose 70 - 99 mg/dL 896  95  95   BUN 8 - 23 mg/dL 15  12  16    Creatinine 0.61 - 1.24 mg/dL 8.70  8.85  8.69   Sodium 135 - 145 mmol/L 140  138  139   Potassium 3.5 - 5.1 mmol/L 4.5  4.1  4.1   Chloride 98 - 111 mmol/L 109  108  107   CO2 22 - 32 mmol/L 24  23  24    Calcium  8.9 - 10.3 mg/dL 8.0  7.8  8.3   Total Protein 6.5 - 8.1 g/dL 5.8  5.6  5.2   Total Bilirubin 0.0 - 1.2 mg/dL 0.5  0.5  0.6   Alkaline Phos 38 - 126 U/L 33  33  34   AST 15 - 41 U/L 19  18  17    ALT 0 - 44 U/L 10  12  11       No results found for: CEA1, CEA / No results found for: CEA1, CEA No results found for: PSA1 No results found for: CAN199 No results found for: RJW874  Lab Results  Component Value Date   TOTALPROTELP 5.2 (L) 05/25/2024   TOTALPROTELP 5.1 (L) 05/25/2024   ALBUMINELP 3.1 05/25/2024  A1GS 0.2  05/25/2024   A2GS 0.6 05/25/2024   BETS 0.7 05/25/2024   GAMS 0.4 05/25/2024   MSPIKE Not Observed 05/25/2024   SPEI Comment 05/25/2024   Lab Results  Component Value Date   TIBC 314 03/02/2023   TIBC 308 08/25/2022   TIBC 325 03/25/2020   FERRITIN 161 03/02/2023   FERRITIN 170 08/25/2022   FERRITIN 640 (H) 03/25/2020   IRONPCTSAT 11 (L) 03/02/2023   IRONPCTSAT 24 08/25/2022   IRONPCTSAT 10 (L) 03/25/2020   Lab Results  Component Value Date   LDH 131 11/18/2021   LDH 127 09/30/2021   LDH 102 08/05/2021     STUDIES:   No results found.

## 2024-06-22 NOTE — Patient Instructions (Signed)

## 2024-06-22 NOTE — Progress Notes (Signed)
 Patient has been examined by Dr. Ellin Saba. Vital signs and labs have been reviewed by MD - ANC, Creatinine, LFTs, hemoglobin, and platelets are within treatment parameters per M.D. - pt may proceed with treatment.  Primary RN and pharmacy notified.

## 2024-06-22 NOTE — Progress Notes (Signed)
Patients port flushed without difficulty.  Good blood return noted with no bruising or swelling noted at site.  Patient remains accessed for IVIG.

## 2024-06-23 ENCOUNTER — Other Ambulatory Visit: Payer: Self-pay

## 2024-06-23 LAB — KAPPA/LAMBDA LIGHT CHAINS
Kappa free light chain: 1.3 mg/L — ABNORMAL LOW (ref 3.3–19.4)
Kappa, lambda light chain ratio: >0.87 (ref 0.26–1.65)
Lambda free light chains: <1.5 mg/L — ABNORMAL LOW (ref 5.7–26.3)

## 2024-06-25 LAB — PROTEIN ELECTROPHORESIS, SERUM
A/G Ratio: 1.4 (ref 0.7–1.7)
Albumin ELP: 3.3 g/dL (ref 2.9–4.4)
Alpha-1-Globulin: 0.2 g/dL (ref 0.0–0.4)
Alpha-2-Globulin: 0.6 g/dL (ref 0.4–1.0)
Beta Globulin: 0.8 g/dL (ref 0.7–1.3)
Gamma Globulin: 0.6 g/dL (ref 0.4–1.8)
Globulin, Total: 2.3 g/dL (ref 2.2–3.9)
Total Protein ELP: 5.6 g/dL — ABNORMAL LOW (ref 6.0–8.5)

## 2024-06-25 LAB — IMMUNOFIXATION ELECTROPHORESIS
IgA: 6 mg/dL — ABNORMAL LOW (ref 61–437)
IgG (Immunoglobin G), Serum: 752 mg/dL (ref 603–1613)
IgM (Immunoglobulin M), Srm: 5 mg/dL — ABNORMAL LOW (ref 15–143)
Total Protein ELP: 5.7 g/dL — ABNORMAL LOW (ref 6.0–8.5)

## 2024-07-06 DIAGNOSIS — J320 Chronic maxillary sinusitis: Secondary | ICD-10-CM | POA: Diagnosis not present

## 2024-07-06 DIAGNOSIS — M272 Inflammatory conditions of jaws: Secondary | ICD-10-CM | POA: Diagnosis not present

## 2024-07-20 ENCOUNTER — Inpatient Hospital Stay (HOSPITAL_BASED_OUTPATIENT_CLINIC_OR_DEPARTMENT_OTHER): Admitting: Hematology

## 2024-07-20 ENCOUNTER — Inpatient Hospital Stay: Attending: Hematology

## 2024-07-20 ENCOUNTER — Inpatient Hospital Stay

## 2024-07-20 ENCOUNTER — Encounter: Payer: Self-pay | Admitting: Hematology

## 2024-07-20 VITALS — BP 154/72 | HR 50 | Temp 98.0°F | Resp 18 | Wt 196.0 lb

## 2024-07-20 DIAGNOSIS — C9002 Multiple myeloma in relapse: Secondary | ICD-10-CM | POA: Insufficient documentation

## 2024-07-20 DIAGNOSIS — Z853 Personal history of malignant neoplasm of breast: Secondary | ICD-10-CM | POA: Insufficient documentation

## 2024-07-20 DIAGNOSIS — J320 Chronic maxillary sinusitis: Secondary | ICD-10-CM | POA: Insufficient documentation

## 2024-07-20 DIAGNOSIS — Z5112 Encounter for antineoplastic immunotherapy: Secondary | ICD-10-CM | POA: Diagnosis not present

## 2024-07-20 DIAGNOSIS — C7951 Secondary malignant neoplasm of bone: Secondary | ICD-10-CM

## 2024-07-20 DIAGNOSIS — Z9221 Personal history of antineoplastic chemotherapy: Secondary | ICD-10-CM | POA: Diagnosis not present

## 2024-07-20 DIAGNOSIS — D801 Nonfamilial hypogammaglobulinemia: Secondary | ICD-10-CM | POA: Insufficient documentation

## 2024-07-20 DIAGNOSIS — Z9484 Stem cells transplant status: Secondary | ICD-10-CM | POA: Insufficient documentation

## 2024-07-20 DIAGNOSIS — Z79899 Other long term (current) drug therapy: Secondary | ICD-10-CM | POA: Diagnosis not present

## 2024-07-20 DIAGNOSIS — D702 Other drug-induced agranulocytosis: Secondary | ICD-10-CM

## 2024-07-20 DIAGNOSIS — D508 Other iron deficiency anemias: Secondary | ICD-10-CM

## 2024-07-20 DIAGNOSIS — Z7961 Long term (current) use of immunomodulator: Secondary | ICD-10-CM | POA: Insufficient documentation

## 2024-07-20 DIAGNOSIS — G629 Polyneuropathy, unspecified: Secondary | ICD-10-CM | POA: Insufficient documentation

## 2024-07-20 DIAGNOSIS — I129 Hypertensive chronic kidney disease with stage 1 through stage 4 chronic kidney disease, or unspecified chronic kidney disease: Secondary | ICD-10-CM | POA: Insufficient documentation

## 2024-07-20 DIAGNOSIS — C9000 Multiple myeloma not having achieved remission: Secondary | ICD-10-CM | POA: Diagnosis not present

## 2024-07-20 DIAGNOSIS — Z801 Family history of malignant neoplasm of trachea, bronchus and lung: Secondary | ICD-10-CM | POA: Diagnosis not present

## 2024-07-20 DIAGNOSIS — E611 Iron deficiency: Secondary | ICD-10-CM | POA: Insufficient documentation

## 2024-07-20 DIAGNOSIS — Z923 Personal history of irradiation: Secondary | ICD-10-CM | POA: Diagnosis not present

## 2024-07-20 DIAGNOSIS — R531 Weakness: Secondary | ICD-10-CM | POA: Diagnosis not present

## 2024-07-20 DIAGNOSIS — Z170 Estrogen receptor positive status [ER+]: Secondary | ICD-10-CM

## 2024-07-20 DIAGNOSIS — R1310 Dysphagia, unspecified: Secondary | ICD-10-CM | POA: Insufficient documentation

## 2024-07-20 LAB — COMPREHENSIVE METABOLIC PANEL WITH GFR
ALT: 14 U/L (ref 0–44)
AST: 22 U/L (ref 15–41)
Albumin: 3.3 g/dL — ABNORMAL LOW (ref 3.5–5.0)
Alkaline Phosphatase: 36 U/L — ABNORMAL LOW (ref 38–126)
Anion gap: 5 (ref 5–15)
BUN: 14 mg/dL (ref 8–23)
CO2: 23 mmol/L (ref 22–32)
Calcium: 7.8 mg/dL — ABNORMAL LOW (ref 8.9–10.3)
Chloride: 108 mmol/L (ref 98–111)
Creatinine, Ser: 1.42 mg/dL — ABNORMAL HIGH (ref 0.61–1.24)
GFR, Estimated: 51 mL/min — ABNORMAL LOW (ref >60)
Glucose, Bld: 98 mg/dL (ref 70–99)
Potassium: 4.1 mmol/L (ref 3.5–5.1)
Sodium: 136 mmol/L (ref 135–145)
Total Bilirubin: 0.7 mg/dL (ref 0.0–1.2)
Total Protein: 6.1 g/dL — ABNORMAL LOW (ref 6.5–8.1)

## 2024-07-20 LAB — CBC WITH DIFFERENTIAL/PLATELET
Abs Immature Granulocytes: 0.01 K/uL (ref 0.00–0.07)
Basophils Absolute: 0 K/uL (ref 0.0–0.1)
Basophils Relative: 1 %
Eosinophils Absolute: 0.2 K/uL (ref 0.0–0.5)
Eosinophils Relative: 3 %
HCT: 34.6 % — ABNORMAL LOW (ref 39.0–52.0)
Hemoglobin: 11.5 g/dL — ABNORMAL LOW (ref 13.0–17.0)
Immature Granulocytes: 0 %
Lymphocytes Relative: 15 %
Lymphs Abs: 0.8 K/uL (ref 0.7–4.0)
MCH: 35.1 pg — ABNORMAL HIGH (ref 26.0–34.0)
MCHC: 33.2 g/dL (ref 30.0–36.0)
MCV: 105.5 fL — ABNORMAL HIGH (ref 80.0–100.0)
Monocytes Absolute: 0.3 K/uL (ref 0.1–1.0)
Monocytes Relative: 5 %
Neutro Abs: 4.4 K/uL (ref 1.7–7.7)
Neutrophils Relative %: 76 %
Platelets: 141 K/uL — ABNORMAL LOW (ref 150–400)
RBC: 3.28 MIL/uL — ABNORMAL LOW (ref 4.22–5.81)
RDW: 14.5 % (ref 11.5–15.5)
WBC: 5.7 K/uL (ref 4.0–10.5)
nRBC: 0 % (ref 0.0–0.2)

## 2024-07-20 MED ORDER — DEXTROSE 5 % IV SOLN: INTRAVENOUS | Status: DC

## 2024-07-20 MED ORDER — ACETAMINOPHEN 325 MG PO TABS
650.0000 mg | ORAL_TABLET | Freq: Once | ORAL | Status: DC
Start: 2024-07-20 — End: 2024-07-20

## 2024-07-20 MED ORDER — SODIUM CHLORIDE 0.9% FLUSH
10.0000 mL | Freq: Once | INTRAVENOUS | Status: AC | PRN
Start: 2024-07-20 — End: 2024-07-20
  Administered 2024-07-20: 10 mL

## 2024-07-20 MED ORDER — HEPARIN SOD (PORK) LOCK FLUSH 100 UNIT/ML IV SOLN
500.0000 [IU] | Freq: Once | INTRAVENOUS | Status: AC | PRN
Start: 2024-07-20 — End: 2024-07-20
  Administered 2024-07-20: 500 [IU]

## 2024-07-20 MED ORDER — LORATADINE 10 MG PO TABS: 10.0000 mg | ORAL_TABLET | Freq: Once | ORAL | Status: DC

## 2024-07-20 MED ORDER — DARATUMUMAB-HYALURONIDASE-FIHJ 1800-30000 MG-UT/15ML ~~LOC~~ SOLN
1800.0000 mg | Freq: Once | SUBCUTANEOUS | Status: AC
  Administered 2024-07-20: 1800 mg via SUBCUTANEOUS
  Filled 2024-07-20: qty 15

## 2024-07-20 MED ORDER — IMMUNE GLOBULIN (HUMAN) 10 GM/100ML IV SOLN
40.0000 g | Freq: Once | INTRAVENOUS | Status: AC
  Administered 2024-07-20: 40 g via INTRAVENOUS
  Filled 2024-07-20: qty 400

## 2024-07-20 MED ORDER — DEXAMETHASONE 4 MG PO TABS
20.0000 mg | ORAL_TABLET | Freq: Once | ORAL | Status: DC
Start: 2024-07-20 — End: 2024-07-20

## 2024-07-20 MED ORDER — FAMOTIDINE IN NACL 20-0.9 MG/50ML-% IV SOLN
20.0000 mg | Freq: Once | INTRAVENOUS | Status: AC
  Administered 2024-07-20: 20 mg via INTRAVENOUS
  Filled 2024-07-20: qty 50

## 2024-07-20 MED ORDER — CETIRIZINE HCL 10 MG/ML IV SOLN
10.0000 mg | Freq: Once | INTRAVENOUS | Status: AC
  Administered 2024-07-20: 10 mg via INTRAVENOUS
  Filled 2024-07-20: qty 1

## 2024-07-20 NOTE — Progress Notes (Signed)
 [name] [name]    Clinic Day:  07/20/2024  Referring physician: Rogers Hai, MD  Patient Care Team: Johnny Hai, MD as PCP - General (Hematology) Johnny Hai, MD as Consulting Physician (Oncology)   ASSESSMENT & PLAN:   Assessment: 1.  Relapsed multiple myeloma: -Autologous stem cell transplant on 10/08/2017 -Post transplant consolidation with CyCarD for 2 cycles with M spike undetectable. -Maintenance therapy with carfilzomib  70 mg per metered square days 1 and 15 every 28 days from 04/20/2018, dose reduced to 35 mg per metered square on 01/05/2019, titrated up to 56 mg per metered square on 01/09/2020. 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 -PET scan on 02/18/2020 showed new left supraclavicular lymph node at the thoracic inlet, SUV 14.2.  Multifocal osseous lesions largely improved, though residual lesions noted including right acromion, left acromion, thyroid  cartilage, right sternum.  Additional lesions throughout the sternum, bilateral ribs, thoracolumbar spine, bilateral pelvis have resolved. -BMBX on 02/14/2020 shows plasma cell myeloma, 60-70% of cells.  FISH for myeloma was negative.  Cytogenetics was normal. -4 cycles of daratumumab , bortezomib  and dexamethasone  from 02/28/2020 through 04/29/2020. -Daratumumab , pomalidomide  and dexamethasone  started on 05/28/2020.  He is receiving daratumumab  every 2 weeks, Pomalyst  3 mg days 1-21, dexamethasone  20 mg weekly. -Pomalidomide  dose reduced to 2 mg days 1-21 around the first week of July 2021, due to severe weakness. - Pomalidomide  and Darzalex  held from 02/16/2024 through 04/26/2024 due to hospitalization, right partial maxillectomy and extraction of remaining right maxillary molar, first premolar and canine, required prolonged antibiotics. - Pomalidomide  held since April 2025 due to oroantral fistula    Plan: 1.   Relapsed IgG kappa multiple myeloma: - He is continuing to tolerate monthly Darzalex  very well.  Pomalyst  has been on hold. - Reviewed myeloma labs from 06/22/2024: M spike is not observed.  Kappa light chains are 1.3 with ratio> 0.87.  Immunofixation was normal. - Labs today: Normal LFTs.  Creatinine 1.42 and stable.  CBC grossly normal. - Given his oroantral fistula which increases risk of sinus infections, I have recommended that we will continue single agent monthly Darzalex .  His myeloma labs have been staying stable.  If there is any progression of the myeloma labs, will consider restarting pomalidomide . - RTC 12 weeks for follow-up with repeat myeloma labs.  2.  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  He was evaluated by Dr. Murry at [facility] and discussed surgical options including temporalis flap.  At this time, he is reluctant to have any aggressive surgery.  Oroantral fistula increases risk of sinus infections. - Continue to hold denosumab  indefinitely.   3.  Macrocytic anemia: - Multifactorial from CKD, functional iron deficiency and myelosuppression.  Latest hemoglobin is 11.5.  Use Retacrit  if hemoglobin drops below 9.  Hemoglobin has been staying high since Pomalyst  was on hold.   4.  ID prophylaxis: - Continue acyclovir  twice daily.   5.  Peripheral neuropathy: - Continue gabapentin  600 mg 3 times daily.   6.  History of breast cancer: - Continue tamoxifen  daily.  7.  Hypogammaglobulinemia: - He has received 3 doses of IVIG starting 04/27/2024.  Immunoglobulin trough level has increased to 752.  No recent infections. - I will change IVIG 40 g to every 8 weeks.  If trough level stays above 600, will further change to every 12 weeks.    No orders of the defined types were  placed in this encounter.    Johnny Lucas,acting as a Neurosurgeon for Johnny Stands, MD.,have documented all relevant documentation on the behalf of Johnny Stands, MD,as directed by  Johnny Stands, MD while in the presence of Johnny Stands, MD.  I, Johnny Stands MD, have reviewed the above documentation for accuracy and completeness, and I agree with the above.      Johnny Stands, MD   7/24/20253:57 PM  CHIEF COMPLAINT:   Diagnosis: multiple myeloma    Cancer Staging  Multiple myeloma ([name] Staging form: Plasma Cell Myeloma and Plasma Cell Disorders, AJCC 8th Edition - Clinical stage from 05/03/2017: Beta-2 -microglobulin (mg/L): 3.5, Albumin  (g/dL): 3.4, ISS: Stage II, High-risk cytogenetics: Absent, LDH: Not assessed - Signed by Johnny Debby RAMAN, PA-C on 05/03/2017    Prior Therapy: 1. RVD x 6 cycles from 03/01/2017 to 06/29/2017. 2. Stem cell transplant on 09/30/2017. 3. Carfilzomib  and cyclophosphamide  x 2 cycles from 02/14/2018 to 03/29/2018. 4. Carfilzomib  x 22 cycles from 04/20/2018 to 01/23/2020.  Current Therapy:  Darzalex  Faspro monthly; Pomalyst  2 mg 3/4 weeks; Xgeva  monthly    HISTORY OF PRESENT ILLNESS:   Oncology History  Breast cancer, male ([name]  12/31/2009 Initial Biopsy   Biopsy of L breast    12/31/2009 Pathology Results   Invasive ductal carcinoma, ER/PR+, HER 2 negative   12/31/2009 Imaging   Ultrasound showing a 2.43 x 1.85 x 3 cm hypoechoic spiculated mass in the 12 o clock L breast retroareolar region   01/01/2010 -  Anti-estrogen oral therapy   Tamoxifen  20 mg daily   01/06/2010 Imaging   Bone scan abnormal uptake in the diaphysis of the R humerus, abnormal in the R third, fifth and sixth ribs, lesion also noted in the sternum.   02/03/2010 Surgery   Rod placement and fixation of R humerus by Dr. Camella   02/05/2010 - 02/18/2010 Radiation Therapy   30Gy in 10 fractions of 3 Gy per fraction to R pathologic fracture   03/11/2010 -  Chemotherapy   Denosumab  monthly, now every 3 months. Started at [facility]    06/09/2016 Imaging   Three hypermetabolic osseous lesions in the sternum, left  ilium and right ilium, as discussed above, likely represent osseous metastases. At this time, these are not recognizable on the CT images. 2. No extra skeletal metastatic disease identified in the neck, chest, abdomen or pelvis.   10/13/2016 Progression   PET shows various new and enlarging osseous metastatic lesions with no definite extra osseous metastatic disease currently identified.    12/31/2016 Progression   1. Multifocal hypermetabolic osseous metastases throughout the axial and proximal appendicular skeleton, which are increased in size, number and metabolism since 10/13/2016 PET-CT. 2. 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 3. No additional sites of hypermetabolic metastatic disease. 4. Chronic right mastoid sinusitis. 5. Aortic atherosclerosis.  One vessel coronary atherosclerosis.   Multiple myeloma ([name]  02/12/2017 Bone Marrow Biopsy   The marrow was variably cellular with large peritrabecular aggregates of kappa restricted plasma cells (66% by aspirate, 30% by Cd138). Cytogenetics +11.    03/01/2017 - 06/29/2017 Chemotherapy   RVD    05/26/2017 Bone Marrow Biopsy   Performed at [facility]:  Plasma cell myeloma in a 30% cellular marrow with decreased trilineage hematopoiesis and 42% kappa light chain restricted plasma cells on the aspirate smears and large aggregates on the core biopsy.  07/12/2017 - 09/01/2017 Chemotherapy   3 cycles of carfizolmib/cyclophosphamide /dexamethasone      10/08/2017 Bone Marrow Transplant   Autotransplant at [facility]   02/28/2020 - 09/01/2022 Chemotherapy   Patient is on Treatment Plan : Daratumumab  q28d     09/01/2022 -  Chemotherapy   Patient is on Treatment Plan : MYELOMA Daratumumab  Faspro Subq q28d     Multiple myeloma not having achieved remission ([name]  06/29/2017 Initial Diagnosis    Multiple myeloma not having achieved remission ([name]   02/28/2020 - 09/01/2022 Chemotherapy   Patient is on Treatment Plan : Daratumumab  q28d     09/01/2022 -  Chemotherapy   Patient is on Treatment Plan : MYELOMA Daratumumab  Faspro Subq q28d        INTERVAL HISTORY:   Johnny Lucas is a 78 y.o. male seen for follow-up of multiple myeloma.  He was last seen by me on 06/22/2024.  Today, he states that he is doing well overall. His appetite level is at 80%. His energy level is at 75%. Johnny Lucas had a consult with Dr. Murry on 07/06/2024 and discussed surgical options for sinus defects. He reports difficulty swallowing and that liquids will occasionally drain into his nose due to holes in the sinus. He would still like to be seen in [place] for a possible fitted prosthetic to the area.   Hamilton last took Pomalyst  since 03/2024. He notes improved energy levels since discontinuing the medication.   PAST MEDICAL HISTORY:   Past Medical History: Past Medical History:  Diagnosis Date   Anxiety    Bone metastases 09/10/2016   Breast cancer, male [facility]) 2011   Stage IV breast cancer; radiation and tamoxifen   Overview:  Left breast ca with mets bone  Overview:  METS TO BONE   Bronchitis    uses inhaler   Cough    uses inhaler   GERD (gastroesophageal reflux disease)    Headache    History of blood transfusion    Hypertension    Macular degeneration    bilateral   Memory loss    mild   Multiple myeloma ([name] 02/18/2017   Peripheral neuropathy    feet   Pneumonia    mild case 1-2 yrs ago (2023 or 2024)    Surgical History: Past Surgical History:  Procedure Laterality Date   BACK SURGERY     C2-C3 fusion   BREAST BIOPSY Left    cancer   CATARACT EXTRACTION W/PHACO Left 02/03/2019   Procedure: CATARACT EXTRACTION PHACO AND INTRAOCULAR LENS PLACEMENT (IOC);  Surgeon: Harrie Agent, MD;  Location: [facility];  Service: Ophthalmology;  Laterality: Left;  CDE: 15.89   CATARACT EXTRACTION W/PHACO Right  07/25/2021   Procedure: CATARACT EXTRACTION PHACO AND INTRAOCULAR LENS PLACEMENT (IOC);  Surgeon: Harrie Agent, MD;  Location: [facility];  Service: Ophthalmology;  Laterality: Right;  CDE   26.71   CLOSURE ORAL ANTRAL FISTULA Right 05/05/2024   Procedure: REPAIR OF RIGHT OROANTRAL FISTULA WITH BUCCAL FLAP;  Surgeon: Luciano Standing, MD;  Location: [facility] OR;  Service: ENT;  Laterality: Right;  REPAIR OF RIGHT OROANTRAL FISTULA WITH BUCCAL FLAP   COLONOSCOPY     HERNIA REPAIR     MAXILLARY ANTROSTOMY Right 02/16/2024   Procedure: REVISION MAXILLARY ANTROSTOMY WITH TISSUE REMOVAL;  Surgeon: Luciano Standing, MD;  Location[facility];  Service: ENT;  Laterality: Right;   NASAL SINUS SURGERY Right 02/16/2024   Procedure: RIGHT ENDOSCOPIC SINUS SURGERY;  Surgeon: Luciano Standing, MD;  Location: [facility] OR;  Service: ENT;  Laterality: Right;   NASAL TURBINATE REDUCTION Right 02/16/2024   Procedure: PARTIAL MAXILLECTOMY AND DENTAL EXTRACTIONS;  Surgeon: Luciano Standing, MD;  Location: [facility] OR;  Service: ENT;  Laterality: Right;   PORTACATH PLACEMENT Left 06/10/2018   Procedure: INSERTION PORT-A-CATH;  Surgeon: Kallie Manuelita BROCKS, MD;  Location: [facility];  Service: General;  Laterality: Left;   right arm surgery     bone cancer and rod placed   SINUS ENDO WITH FUSION Bilateral 03/25/2023   Procedure: FUNCTIONAL ENDOSCOPIC SINUS SURGERY WITH FUSION AND NAVIGATION;  Surgeon: Luciano Standing, MD;  Location: [facility] OR;  Service: ENT;  Laterality: Bilateral;   SKIN FULL THICKNESS GRAFT Right 05/05/2024   Procedure: ORAL CAVITY ADJACENT TISSUE TRANSFER;  Surgeon: Luciano Standing, MD;  Location: [facility];  Service: ENT;  Laterality: Right;  ORAL CAVITY ADJACENT TISSUE TRANSFER    Social History: Social History   Socioeconomic History   Marital status: Married    Spouse name: Not on file   Number of children: 3   Years of education: Not on file   Highest education level: Not on file  Occupational History   Not on file  Tobacco Use    Smoking status: Never   Smokeless tobacco: Former    Types: Chew  Vaping Use   Vaping status: Never Used  Substance and Sexual Activity   Alcohol  use: Yes    Alcohol /week: 24.0 standard drinks of alcohol     Types: 24 Cans of beer per week    Comment: 3-4 beers a week   Drug use: No   Sexual activity: Yes  Other Topics Concern   Not on file  Social History Narrative   Not on file   Social Drivers of Health   Financial Resource Strain: Low Risk  (11/26/2020)   Overall Financial Resource Strain (CARDIA)    Difficulty of Paying Living Expenses: Not hard at all  Food Insecurity: Low Risk  (05/19/2024)   Received from [facility]   Hunger Vital Sign    Within the past 12 months, you worried that your food would run out before you got money to buy more: Never true    Within the past 12 months, the food you bought just didn't last and you didn't have money to get more. : Never true  Transportation Needs: No Transportation Needs (05/19/2024)   Received from [employer]    In the past 12 months, has lack of reliable transportation kept you from medical appointments, meetings, work or from getting things needed for daily living? : No  Physical Activity: Inactive (11/26/2020)   Exercise Vital Sign    Days of Exercise per Week: 0 days    Minutes of Exercise per Session: 0 min  Stress: Stress Concern Present (11/26/2020)   [employer] of Occupational Health - Occupational Stress Questionnaire    Feeling of Stress : To some extent  Social Connections: Moderately Integrated (02/17/2024)   Social Connection and Isolation Panel    Frequency of Communication with Friends and Family: More than three times a week    Frequency of Social Gatherings with Friends and Family: Three times a week    Attends Religious Services: 1 to 4 times per year    Active Member of Clubs or Organizations: No    Attends Banker Meetings: Never    Marital Status: Married   Catering manager Violence: Not At Risk (02/17/2024)   Humiliation, Afraid, Rape, and Kick questionnaire  Fear of Current or Ex-Partner: No    Emotionally Abused: No    Physically Abused: No    Sexually Abused: No    Family History: Family History  Problem Relation Age of Onset   Stroke Mother    Cancer Maternal Aunt        cancer NOS; died in her 61s   Lung cancer Maternal Uncle        smoker    Current Medications:  Current Outpatient Medications:    acetaminophen  (TYLENOL ) 500 MG tablet, Take 1,000 mg by mouth every 8 (eight) hours as needed for mild pain (pain score 1-3) or moderate pain (pain score 4-6)., Disp: , Rfl:    acyclovir  (ZOVIRAX ) 800 MG tablet, TAKE 1 TABLET TWICE DAILY, Disp: 180 tablet, Rfl: 3   albuterol  (VENTOLIN  HFA) 108 (90 Base) MCG/ACT inhaler, Inhale 2 puffs into the lungs every 6 (six) hours as needed for wheezing or shortness of breath., Disp: , Rfl:    ALPRAZolam  (XANAX ) 0.5 MG tablet, TAKE 1 TABLET BY MOUTH AT BEDTIME AS NEEDED FOR anxiety / SLEEP, Disp: 30 tablet, Rfl: 5   aspirin  EC 81 MG tablet, Take 81 mg by mouth daily. , Disp: , Rfl:    cetirizine  (ZYRTEC ) 10 MG tablet, Take 10 mg by mouth daily., Disp: , Rfl:    chlorhexidine  (PERIDEX ) 0.12 % solution, Swish and spit after meals and before bed (Total 4 times daily) for 10 days., Disp: 473 mL, Rfl: 1   DAPTOmycin  (CUBICIN ) 500 MG injection, Inject into the vein., Disp: , Rfl:    dexamethasone  (DECADRON ) 4 MG tablet, Take 20 mg by mouth every 30 (thirty) days., Disp: , Rfl:    folic acid  (FOLVITE ) 1 MG tablet, TAKE 1 TABLET BY MOUTH DAILY, Disp: 90 tablet, Rfl: 2   gabapentin  (NEURONTIN ) 300 MG capsule, Take 1 capsule (300 mg total) by mouth 3 (three) times daily., Disp: 180 capsule, Rfl: 2   metoprolol  succinate (TOPROL -XL) 100 MG 24 hr tablet, Take 100 mg by mouth in the morning., Disp: , Rfl:    mupirocin  ointment (BACTROBAN ) 2 %, Apply topically., Disp: , Rfl:    pantoprazole  (PROTONIX ) 40 MG  tablet, TAKE 1 TABLET EVERY OTHER DAY, Disp: 45 tablet, Rfl: 3   tamoxifen  (NOLVADEX ) 20 MG tablet, TAKE 1 TABLET EVERY DAY, Disp: 90 tablet, Rfl: 3   POMALYST  2 MG capsule, Take 1 capsule (2 mg total) by mouth daily. (Patient not taking: Reported on 07/20/2024), Disp: 21 capsule, Rfl: 0 No current facility-administered medications for this visit.  Facility-Administered Medications Ordered in Other Visits:    acetaminophen  (TYLENOL ) tablet 650 mg, 650 mg, Oral, Once, Johnny Hai, MD   dexamethasone  (DECADRON ) tablet 20 mg, 20 mg, Oral, Once, Johnny Hai, MD   dextrose  5 % solution, , Intravenous, Continuous, Othel Hoogendoorn, MD, Stopped at 07/20/24 1537   ondansetron  (ZOFRAN ) 8 mg in sodium chloride  0.9 % 50 mL IVPB, 8 mg, Intravenous, Once, Lockamy, Randi L, NP-C   sodium chloride  flush (NS) 0.9 % injection 10 mL, 10 mL, Intracatheter, Once PRN, Pricsilla Lindvall, MD   Allergies: Allergies  Allergen Reactions   Cefepime      Suspected severe thrombocytopenia is a result of cefepime  induced antigen platelet destruction    REVIEW OF SYSTEMS:   Review of Systems  Constitutional:  Negative for chills, fatigue and fever.  HENT:   Positive for trouble swallowing. Negative for lump/mass, mouth sores, nosebleeds and sore throat.   Eyes:  Negative for eye problems.  Respiratory:  Negative for cough and shortness of breath.   Cardiovascular:  Negative for chest pain, leg swelling and palpitations.  Gastrointestinal:  Negative for abdominal pain, constipation, diarrhea, nausea and vomiting.  Genitourinary:  Negative for bladder incontinence, difficulty urinating, dysuria, frequency, hematuria and nocturia.   Musculoskeletal:  Negative for arthralgias, back pain, flank pain, myalgias and neck pain.  Skin:  Negative for itching and rash.  Neurological:  Negative for dizziness, headaches and numbness.  Hematological:  Does not bruise/bleed easily.  Psychiatric/Behavioral:   Negative for depression, sleep disturbance and suicidal ideas. The patient is not nervous/anxious.   All other systems reviewed and are negative.    VITALS:   There were no vitals taken for this visit.  Wt Readings from Last 3 Encounters:  07/20/24 196 lb (88.9 kg)  06/22/24 195 lb 8.8 oz (88.7 kg)  05/25/24 192 lb 3.2 oz (87.2 kg)    There is no height or weight on file to calculate BMI.  Performance status (ECOG): 1 - Symptomatic but completely ambulatory  PHYSICAL EXAM:   Physical Exam Vitals and nursing note reviewed. Exam conducted with a chaperone present.  Constitutional:      Appearance: Normal appearance.  Cardiovascular:     Rate and Rhythm: Normal rate and regular rhythm.     Pulses: Normal pulses.     Heart sounds: Normal heart sounds.  Pulmonary:     Effort: Pulmonary effort is normal.     Breath sounds: Normal breath sounds.  Abdominal:     Palpations: Abdomen is soft. There is no hepatomegaly, splenomegaly or mass.     Tenderness: There is no abdominal tenderness.  Musculoskeletal:     Right lower leg: No edema.     Left lower leg: No edema.  Lymphadenopathy:     Cervical: No cervical adenopathy.     Right cervical: No superficial, deep or posterior cervical adenopathy.    Left cervical: No superficial, deep or posterior cervical adenopathy.     Upper Body:     Right upper body: No supraclavicular or axillary adenopathy.     Left upper body: No supraclavicular or axillary adenopathy.  Neurological:     General: No focal deficit present.     Mental Status: He is alert and oriented to person, place, and time.  Psychiatric:        Mood and Affect: Mood normal.        Behavior: Behavior normal.     LABS:      Latest Ref Rng & Units 07/20/2024    9:18 AM 06/22/2024    8:34 AM 05/25/2024    9:22 AM  CBC  WBC 4.0 - 10.5 K/uL 5.7  6.1  5.6   Hemoglobin 13.0 - 17.0 g/dL 88.4  88.5  88.5   Hematocrit 39.0 - 52.0 % 34.6  34.3  34.6   Platelets 150 - 400  K/uL 141  148  160       Latest Ref Rng & Units 07/20/2024    9:18 AM 06/22/2024    8:34 AM 05/25/2024    9:22 AM  CMP  Glucose 70 - 99 mg/dL 98  896  95   BUN 8 - 23 mg/dL 14  15  12    Creatinine 0.61 - 1.24 mg/dL 8.57  8.70  8.85   Sodium 135 - 145 mmol/L 136  140  138   Potassium 3.5 - 5.1 mmol/L 4.1  4.5  4.1   Chloride 98 - 111 mmol/L  108  109  108   CO2 22 - 32 mmol/L 23  24  23    Calcium  8.9 - 10.3 mg/dL 7.8  8.0  7.8   Total Protein 6.5 - 8.1 g/dL 6.1  5.8  5.6   Total Bilirubin 0.0 - 1.2 mg/dL 0.7  0.5  0.5   Alkaline Phos 38 - 126 U/L 36  33  33   AST 15 - 41 U/L 22  19  18    ALT 0 - 44 U/L 14  10  12       No results found for: CEA1, CEA / No results found for: CEA1, CEA No results found for: PSA1 No results found for: CAN199 No results found for: RJW874  Lab Results  Component Value Date   TOTALPROTELP 5.7 (L) 06/22/2024   TOTALPROTELP 5.6 (L) 06/22/2024   ALBUMINELP 3.3 06/22/2024   A1GS 0.2 06/22/2024   A2GS 0.6 06/22/2024   BETS 0.8 06/22/2024   GAMS 0.6 06/22/2024   MSPIKE Not Observed 06/22/2024   SPEI Comment 06/22/2024   Lab Results  Component Value Date   TIBC 314 03/02/2023   TIBC 308 08/25/2022   TIBC 325 03/25/2020   FERRITIN 161 03/02/2023   FERRITIN 170 08/25/2022   FERRITIN 640 (H) 03/25/2020   IRONPCTSAT 11 (L) 03/02/2023   IRONPCTSAT 24 08/25/2022   IRONPCTSAT 10 (L) 03/25/2020   Lab Results  Component Value Date   LDH 131 11/18/2021   LDH 127 09/30/2021   LDH 102 08/05/2021     STUDIES:   No results found.

## 2024-07-20 NOTE — Progress Notes (Signed)
 Patient presents today for chemotherapy infusion/injection of Daratumumab . Patient is in satisfactory condition with no new complaints voiced.  Vital signs are stable.  Labs reviewed by Dr. Rogers during the office visit and all labs are within treatment parameters. Patient reports taking Tylenol  650mg  and Decadron  20mg  this morning at 8am prior to arrival. We will proceed with treatment per MD orders.   Patient tolerated injection of Daratumumab  in left lower abd with no complaints voiced.  Site clean and dry with no bruising or swelling noted.     Patient tolerated treatment well with no complaints voiced.  Patient left ambulatory in stable condition.  Vital signs stable at discharge.  Follow up as scheduled.

## 2024-07-20 NOTE — Patient Instructions (Signed)
 [facility] [place] - A DEPT OF [place]  Discharge Instructions: Thank you for choosing [facility] to provide your oncology and hematology care.  If you have a lab appointment with the Cancer Center - please note that after April 8th, 2024, all labs will be drawn in the cancer center.  You do not have to check in or register with the main entrance as you have in the past but will complete your check-in in the cancer center.  Wear comfortable clothing and clothing appropriate for easy access to any Portacath or PICC line.   We strive to give you quality time with your provider. You may need to reschedule your appointment if you arrive late (15 or more minutes).  Arriving late affects you and other patients whose appointments are after yours.  Also, if you miss three or more appointments without notifying the office, you may be dismissed from the clinic at the provider's discretion.      For prescription refill requests, have your pharmacy contact our office and allow 72 hours for refills to be completed.    Today you received the following chemotherapy and/or immunotherapy agents Daratumumab  and IVIG.  Daratumumab  Injection What is this medication? DARATUMUMAB  (dar a toom ue mab) treats multiple myeloma, a type of bone marrow cancer. It works by helping your immune system slow or stop the spread of cancer cells. It is a monoclonal antibody. This medicine may be used for other purposes; ask your health care provider or pharmacist if you have questions. COMMON BRAND NAME(S): DARZALEX  What should I tell my care team before I take this medication? They need to know if you have any of these conditions: Hereditary fructose intolerance Infection, such as chickenpox, herpes, hepatitis B Lung or breathing disease, such as asthma, COPD An unusual or allergic reaction to daratumumab , sorbitol, other medications, foods, dyes, or preservatives Pregnant or trying  to get pregnant Breastfeeding How should I use this medication? This medication is injected into a vein. It is given by your care team in a hospital or clinic setting. Talk to your care team about the use of this medication in children. Special care may be needed. Overdosage: If you think you have taken too much of this medicine contact a poison control center or emergency room at once. NOTE: This medicine is only for you. Do not share this medicine with others. What if I miss a dose? Keep appointments for follow-up doses. It is important not to miss your dose. Call your care team if you are unable to keep an appointment. What may interact with this medication? Interactions have not been studied. This list may not describe all possible interactions. Give your health care provider a list of all the medicines, herbs, non-prescription drugs, or dietary supplements you use. Also tell them if you smoke, drink alcohol , or use illegal drugs. Some items may interact with your medicine. What should I watch for while using this medication? Your condition will be monitored carefully while you are receiving this medication. This medication can cause serious allergic reactions. To reduce your risk, your care team may give you other medication to take before receiving this one. Be sure to follow the directions from your care team. This medication can affect the results of blood tests to match your blood type. These changes can last for up to 6 months after the final dose. Your care team will do blood tests to match your blood type before  you start treatment. Tell all of your care team that you are being treated with this medication before receiving a blood transfusion. This medication can affect the results of some tests used to determine treatment response; extra tests may be needed to evaluate response. Talk to your care team if you wish to become pregnant or think you are pregnant. This medication can cause  serious birth defects if taken during pregnancy and for 3 months after the last dose. A reliable form of contraception is recommended while taking this medication and for 3 months after the last dose. Talk to your care team about effective forms of contraception. Do not breast-feed while taking this medication. What side effects may I notice from receiving this medication? Side effects that you should report to your care team as soon as possible: Allergic reactions--skin rash, itching, hives, swelling of the face, lips, tongue, or throat Infection--fever, chills, cough, sore throat, wounds that don't heal, pain or trouble when passing urine, general feeling of discomfort or being unwell Infusion reactions--chest pain, shortness of breath or trouble breathing, feeling faint or lightheaded Unusual bruising or bleeding Side effects that usually do not require medical attention (report to your care team if they continue or are bothersome): Constipation Diarrhea Fatigue Nausea Pain, tingling, or numbness in the hands or feet Swelling of the ankles, hands, or feet This list may not describe all possible side effects. Call your doctor for medical advice about side effects. You may report side effects to FDA at 1-800-FDA-1088. Where should I keep my medication? This medication is given in a hospital or clinic. It will not be stored at home. NOTE: This sheet is a summary. It may not cover all possible information. If you have questions about this medicine, talk to your doctor, pharmacist, or health care provider.  2024 Elsevier/Gold Standard (2022-10-22 00:00:00)  Immune Globulin  Injection What is this medication? IMMUNE GLOBULIN  (im MUNE GLOB yoo lin) treats many immune system conditions. It works by Designer, multimedia extra antibodies. Antibodies are proteins made by the immune system that help protect the body. This medicine may be used for other purposes; ask your health care provider or pharmacist  if you have questions. COMMON BRAND NAME(S): ASCENIV, Baygam, BIVIGAM, Carimune, Carimune NF, cutaquig, Cuvitru, Flebogamma, Flebogamma DIF, GamaSTAN, GamaSTAN S/D, Gamimune N, Gammagard, Gammagard S/D, Gammaked, Gammaplex, Gammar-P IV, Gamunex, Gamunex-C, Hizentra, Iveegam, Iveegam EN, Octagam, Panglobulin, Panglobulin NF, panzyga, Polygam S/D, Privigen , Sandoglobulin, Venoglobulin-S, Vigam, Vivaglobulin, Xembify What should I tell my care team before I take this medication? They need to know if you have any of these conditions: Blood clotting disorder Condition where you have excess fluid in your body, such as heart failure or edema Dehydration Diabetes Have had blood clots Heart disease Immune system conditions Kidney disease Low levels of IgA Recent or upcoming vaccine An unusual or allergic reaction to immune globulin , other medications, foods, dyes, or preservatives Pregnant or trying to get pregnant Breastfeeding How should I use this medication? This medication is infused into a vein or under the skin. It is usually given by your care team in a hospital or clinic setting. It may also be given at home. If you get this medication at home, you will be taught how to prepare and give it. Use exactly as directed. Take it as directed on the prescription label at the same time every day. Keep taking it unless your care team tells you to stop. It is important that you put your used needles and syringes  in a special sharps container. Do not put them in a trash can. If you do not have a sharps container, call your pharmacist or care team to get one. Talk to your care team about the use of this medication in children. While it may be given to children for selected conditions, precautions do apply. Overdosage: If you think you have taken too much of this medicine contact a poison control center or emergency room at once. NOTE: This medicine is only for you. Do not share this medicine with  others. What if I miss a dose? If you get this medication at the hospital or clinic: It is important not to miss your dose. Call your care team if you are unable to keep an appointment. If you give yourself this medication at home: If you miss a dose, take it as soon as you can. Then continue your normal schedule. If it is almost time for your next dose, take only that dose. Do not take double or extra doses. Call your care team with questions. What may interact with this medication? Live virus vaccines This list may not describe all possible interactions. Give your health care provider a list of all the medicines, herbs, non-prescription drugs, or dietary supplements you use. Also tell them if you smoke, drink alcohol , or use illegal drugs. Some items may interact with your medicine. What should I watch for while using this medication? Your condition will be monitored carefully while you are receiving this medication. Tell your care team if your symptoms do not start to get better or if they get worse. You may need blood work done while you are taking this medication. This medication increases the risk of blood clots. People with heart, blood vessel, or blood clotting conditions are more likely to develop a blood clot. Other risk factors include advanced age, estrogen use, tobacco use, lack of movement, and being overweight. This medication can decrease the response to a vaccine. If you need to get vaccinated, tell your care team if you have received this medication within the last year. Extra booster doses may be needed. Talk to your care team to see if a different vaccination schedule is needed. If you have diabetes, you may get a falsely elevated blood sugar reading. Talk to your care team about how to check your blood sugar while taking this medication. What side effects may I notice from receiving this medication? Side effects that you should report to your care team as soon as  possible: Allergic reactions--skin rash, itching, hives, swelling of the face, lips, tongue, or throat Blood clot--pain, swelling, or warmth in the leg, shortness of breath, chest pain Fever, neck pain or stiffness, sensitivity to light, headache, nausea, vomiting, confusion, which may be signs of meningitis Hemolytic anemia--unusual weakness or fatigue, dizziness, headache, trouble breathing, dark urine, yellowing skin or eyes Kidney injury--decrease in the amount of urine, swelling of the ankles, hands, or feet Low sodium level--muscle weakness, fatigue, dizziness, headache, confusion Shortness of breath or trouble breathing, cough, unusual weakness or fatigue, blue skin or lips Side effects that usually do not require medical attention (report these to your care team if they continue or are bothersome): Chills Diarrhea Fever Headache Nausea This list may not describe all possible side effects. Call your doctor for medical advice about side effects. You may report side effects to FDA at 1-800-FDA-1088. Where should I keep my medication? Keep out of the reach of children and pets. You will be instructed on how  to store this medication. Get rid of any unused medication after the expiration date. To get rid of medications that are no longer needed or have expired: Take the medication to a medication take-back program. Check with your pharmacy or law enforcement to find a location. If you cannot return the medication, ask your pharmacist or care team how to get rid of this medication safely. NOTE: This sheet is a summary. It may not cover all possible information. If you have questions about this medicine, talk to your doctor, pharmacist, or health care provider.  2024 Elsevier/Gold Standard (2023-11-26 00:00:00)   To help prevent nausea and vomiting after your treatment, we encourage you to take your nausea medication as directed.  BELOW ARE SYMPTOMS THAT SHOULD BE REPORTED  IMMEDIATELY: *FEVER GREATER THAN 100.4 F (38 C) OR HIGHER *CHILLS OR SWEATING *NAUSEA AND VOMITING THAT IS NOT CONTROLLED WITH YOUR NAUSEA MEDICATION *UNUSUAL SHORTNESS OF BREATH *UNUSUAL BRUISING OR BLEEDING *URINARY PROBLEMS (pain or burning when urinating, or frequent urination) *BOWEL PROBLEMS (unusual diarrhea, constipation, pain near the anus) TENDERNESS IN MOUTH AND THROAT WITH OR WITHOUT PRESENCE OF ULCERS (sore throat, sores in mouth, or a toothache) UNUSUAL RASH, SWELLING OR PAIN  UNUSUAL VAGINAL DISCHARGE OR ITCHING   Items with * indicate a potential emergency and should be followed up as soon as possible or go to the Emergency Department if any problems should occur.  Please show the CHEMOTHERAPY ALERT CARD or IMMUNOTHERAPY ALERT CARD at check-in to the Emergency Department and triage nurse.  Should you have questions after your visit or need to cancel or reschedule your appointment, please contact [facility] [place] - A DEPT OF [facility] [phone]  and follow the prompts.  Office hours are 8:00 a.m. to 4:30 p.m. Monday - Friday. Please note that voicemails left after 4:00 p.m. may not be returned until the following business day.  We are closed weekends and major holidays. You have access to a nurse at all times for urgent questions. Please call the main number to the clinic [phone] and follow the prompts.  For any non-urgent questions, you may also contact your provider using MyChart. We now offer e-Visits for anyone 30 and older to request care online for non-urgent symptoms. For details visit [email or web address].   Also download the MyChart app! Go to the app store, search MyChart, open the app, select [place], and log in with your MyChart username and password.

## 2024-07-20 NOTE — Progress Notes (Signed)
 Patient has been examined by Dr. Ellin Saba. Vital signs and labs have been reviewed by MD - ANC, Creatinine, LFTs, hemoglobin, and platelets are within treatment parameters per M.D. - pt may proceed with treatment.  Primary RN and pharmacy notified.

## 2024-07-20 NOTE — Patient Instructions (Signed)

## 2024-07-21 ENCOUNTER — Other Ambulatory Visit: Payer: Self-pay

## 2024-07-21 LAB — KAPPA/LAMBDA LIGHT CHAINS
Kappa free light chain: 3.1 mg/L — ABNORMAL LOW (ref 3.3–19.4)
Kappa, lambda light chain ratio: >2.07 — ABNORMAL HIGH (ref 0.26–1.65)
Lambda free light chains: <1.5 mg/L — ABNORMAL LOW (ref 5.7–26.3)

## 2024-07-22 LAB — PROTEIN ELECTROPHORESIS, SERUM
A/G Ratio: 1.2 (ref 0.7–1.7)
Albumin ELP: 3.1 g/dL (ref 2.9–4.4)
Alpha-1-Globulin: 0.3 g/dL (ref 0.0–0.4)
Alpha-2-Globulin: 0.7 g/dL (ref 0.4–1.0)
Beta Globulin: 0.9 g/dL (ref 0.7–1.3)
Gamma Globulin: 0.8 g/dL (ref 0.4–1.8)
Globulin, Total: 2.6 g/dL (ref 2.2–3.9)
Total Protein ELP: 5.7 g/dL — ABNORMAL LOW (ref 6.0–8.5)

## 2024-07-24 DIAGNOSIS — X32XXXD Exposure to sunlight, subsequent encounter: Secondary | ICD-10-CM | POA: Diagnosis not present

## 2024-07-24 DIAGNOSIS — D0439 Carcinoma in situ of skin of other parts of face: Secondary | ICD-10-CM | POA: Diagnosis not present

## 2024-07-24 DIAGNOSIS — L570 Actinic keratosis: Secondary | ICD-10-CM | POA: Diagnosis not present

## 2024-07-24 DIAGNOSIS — Z08 Encounter for follow-up examination after completed treatment for malignant neoplasm: Secondary | ICD-10-CM | POA: Diagnosis not present

## 2024-07-24 DIAGNOSIS — L905 Scar conditions and fibrosis of skin: Secondary | ICD-10-CM | POA: Diagnosis not present

## 2024-07-24 DIAGNOSIS — Z85828 Personal history of other malignant neoplasm of skin: Secondary | ICD-10-CM | POA: Diagnosis not present

## 2024-07-24 DIAGNOSIS — L820 Inflamed seborrheic keratosis: Secondary | ICD-10-CM | POA: Diagnosis not present

## 2024-07-24 DIAGNOSIS — L821 Other seborrheic keratosis: Secondary | ICD-10-CM | POA: Diagnosis not present

## 2024-07-24 LAB — IMMUNOFIXATION ELECTROPHORESIS
IgA: 7 mg/dL — ABNORMAL LOW (ref 61–437)
IgG (Immunoglobin G), Serum: 881 mg/dL (ref 603–1613)
IgM (Immunoglobulin M), Srm: 6 mg/dL — ABNORMAL LOW (ref 15–143)
Total Protein ELP: 5.7 g/dL — ABNORMAL LOW (ref 6.0–8.5)

## 2024-07-29 ENCOUNTER — Other Ambulatory Visit: Payer: Self-pay

## 2024-08-17 ENCOUNTER — Inpatient Hospital Stay: Attending: Hematology

## 2024-08-17 ENCOUNTER — Encounter: Payer: Self-pay | Admitting: Oncology

## 2024-08-17 ENCOUNTER — Inpatient Hospital Stay: Admitting: Oncology

## 2024-08-17 ENCOUNTER — Inpatient Hospital Stay

## 2024-08-17 VITALS — Wt 195.0 lb

## 2024-08-17 DIAGNOSIS — C9000 Multiple myeloma not having achieved remission: Secondary | ICD-10-CM | POA: Diagnosis not present

## 2024-08-17 DIAGNOSIS — Z5112 Encounter for antineoplastic immunotherapy: Secondary | ICD-10-CM | POA: Insufficient documentation

## 2024-08-17 LAB — CBC WITH DIFFERENTIAL/PLATELET
Abs Immature Granulocytes: 0 K/uL (ref 0.00–0.07)
Basophils Absolute: 0 K/uL (ref 0.0–0.1)
Basophils Relative: 1 %
Eosinophils Absolute: 0.2 K/uL (ref 0.0–0.5)
Eosinophils Relative: 3 %
HCT: 34 % — ABNORMAL LOW (ref 39.0–52.0)
Hemoglobin: 11.4 g/dL — ABNORMAL LOW (ref 13.0–17.0)
Immature Granulocytes: 0 %
Lymphocytes Relative: 16 %
Lymphs Abs: 0.9 K/uL (ref 0.7–4.0)
MCH: 35.5 pg — ABNORMAL HIGH (ref 26.0–34.0)
MCHC: 33.5 g/dL (ref 30.0–36.0)
MCV: 105.9 fL — ABNORMAL HIGH (ref 80.0–100.0)
Monocytes Absolute: 0.3 K/uL (ref 0.1–1.0)
Monocytes Relative: 6 %
Neutro Abs: 4.2 K/uL (ref 1.7–7.7)
Neutrophils Relative %: 74 %
Platelets: 158 K/uL (ref 150–400)
RBC: 3.21 MIL/uL — ABNORMAL LOW (ref 4.22–5.81)
RDW: 14.1 % (ref 11.5–15.5)
WBC: 5.7 K/uL (ref 4.0–10.5)
nRBC: 0 % (ref 0.0–0.2)

## 2024-08-17 LAB — COMPREHENSIVE METABOLIC PANEL WITH GFR
ALT: 12 U/L (ref 0–44)
AST: 20 U/L (ref 15–41)
Albumin: 3.2 g/dL — ABNORMAL LOW (ref 3.5–5.0)
Alkaline Phosphatase: 36 U/L — ABNORMAL LOW (ref 38–126)
Anion gap: 8 (ref 5–15)
BUN: 12 mg/dL (ref 8–23)
CO2: 22 mmol/L (ref 22–32)
Calcium: 8.3 mg/dL — ABNORMAL LOW (ref 8.9–10.3)
Chloride: 109 mmol/L (ref 98–111)
Creatinine, Ser: 1.37 mg/dL — ABNORMAL HIGH (ref 0.61–1.24)
GFR, Estimated: 53 mL/min — ABNORMAL LOW (ref >60)
Glucose, Bld: 100 mg/dL — ABNORMAL HIGH (ref 70–99)
Potassium: 4 mmol/L (ref 3.5–5.1)
Sodium: 139 mmol/L (ref 135–145)
Total Bilirubin: 0.5 mg/dL (ref 0.0–1.2)
Total Protein: 6 g/dL — ABNORMAL LOW (ref 6.5–8.1)

## 2024-08-17 LAB — MAGNESIUM: Magnesium: 2 mg/dL (ref 1.7–2.4)

## 2024-08-17 MED ORDER — ACETAMINOPHEN 325 MG PO TABS: 650.0000 mg | ORAL_TABLET | Freq: Once | ORAL | Status: DC

## 2024-08-17 MED ORDER — DEXAMETHASONE 4 MG PO TABS: 20.0000 mg | ORAL_TABLET | Freq: Once | ORAL | Status: DC

## 2024-08-17 MED ORDER — DARATUMUMAB-HYALURONIDASE-FIHJ 1800-30000 MG-UT/15ML ~~LOC~~ SOLN
1800.0000 mg | Freq: Once | SUBCUTANEOUS | Status: AC
  Administered 2024-08-17: 1800 mg via SUBCUTANEOUS
  Filled 2024-08-17: qty 15

## 2024-08-17 MED ORDER — LORATADINE 10 MG PO TABS
10.0000 mg | ORAL_TABLET | Freq: Once | ORAL | Status: DC
  Filled 2024-08-17: qty 1

## 2024-08-17 NOTE — Progress Notes (Signed)
 Patient presents today for Darzalex  Faspro. Vital signs and lab work within parameters for treatment. Patient denies any side effects related to last treatment.   Darzalex  Faspro given today per MD orders. Tolerated  without adverse affects. Vital signs stable. No complaints at this time. Discharged from clinic ambulatory in stable condition. Alert and oriented x 3. F/U with [facility] as scheduled.

## 2024-08-17 NOTE — Patient Instructions (Signed)
 [facility] [place] - A DEPT OF [facility]  Discharge Instructions: Thank you for choosing [facility] to provide your oncology and hematology care.  If you have a lab appointment with the Cancer Center - please note that after April 8th, 2024, all labs will be drawn in the cancer center.  You do not have to check in or register with the main entrance as you have in the past but will complete your check-in in the cancer center.  Wear comfortable clothing and clothing appropriate for easy access to any Portacath or PICC line.   We strive to give you quality time with your provider. You may need to reschedule your appointment if you arrive late (15 or more minutes).  Arriving late affects you and other patients whose appointments are after yours.  Also, if you miss three or more appointments without notifying the office, you may be dismissed from the clinic at the provider's discretion.      For prescription refill requests, have your pharmacy contact our office and allow 72 hours for refills to be completed.    Today you received the following chemotherapy and/or immunotherapy agents Darzalex Faspro. Daratumumab; Hyaluronidase Injection What is this medication? DARATUMUMAB; HYALURONIDASE (dar a toom ue mab; hye al ur ON i dase) treats multiple myeloma, a type of bone marrow cancer. Daratumumab works by blocking a protein that causes cancer cells to grow and multiply. This helps to slow or stop the spread of cancer cells. Hyaluronidase works by increasing the absorption of other medications in the body to help them work better. This medication may also be used treat amyloidosis, a condition that causes the buildup of a protein (amyloid) in your body. It works by reducing the buildup of this protein, which decreases symptoms. It is a combination medication that contains a monoclonal antibody. This medicine may be used for other purposes; ask your health care  provider or pharmacist if you have questions. COMMON BRAND NAME(S): DARZALEX FASPRO What should I tell my care team before I take this medication? They need to know if you have any of these conditions: Heart disease Infection, such as chickenpox, cold sores, herpes, hepatitis B Lung or breathing disease An unusual or allergic reaction to daratumumab, hyaluronidase, other medications, foods, dyes, or preservatives Pregnant or trying to get pregnant Breast-feeding How should I use this medication? This medication is injected under the skin. It is given by your care team in a hospital or clinic setting. Talk to your care team about the use of this medication in children. Special care may be needed. Overdosage: If you think you have taken too much of this medicine contact a poison control center or emergency room at once. NOTE: This medicine is only for you. Do not share this medicine with others. What if I miss a dose? Keep appointments for follow-up doses. It is important not to miss your dose. Call your care team if you are unable to keep an appointment. What may interact with this medication? Interactions have not been studied. This list may not describe all possible interactions. Give your health care provider a list of all the medicines, herbs, non-prescription drugs, or dietary supplements you use. Also tell them if you smoke, drink alcohol, or use illegal drugs. Some items may interact with your medicine. What should I watch for while using this medication? Your condition will be monitored carefully while you are receiving this medication. This medication can cause serious allergic  reactions. To reduce your risk, your care team may give you other medication to take before receiving this one. Be sure to follow the directions from your care team. This medication can affect the results of blood tests to match your blood type. These changes can last for up to 6 months after the final dose.  Your care team will do blood tests to match your blood type before you start treatment. Tell all of your care team that you are being treated with this medication before receiving a blood transfusion. This medication can affect the results of some tests used to determine treatment response; extra tests may be needed to evaluate response. Talk to your care team if you wish to become pregnant or think you are pregnant. This medication can cause serious birth defects if taken during pregnancy and for 3 months after the last dose. A reliable form of contraception is recommended while taking this medication and for 3 months after the last dose. Talk to your care team about effective forms of contraception. Do not breast-feed while taking this medication. What side effects may I notice from receiving this medication? Side effects that you should report to your care team as soon as possible: Allergic reactions--skin rash, itching, hives, swelling of the face, lips, tongue, or throat Heart rhythm changes--fast or irregular heartbeat, dizziness, feeling faint or lightheaded, chest pain, trouble breathing Infection--fever, chills, cough, sore throat, wounds that don't heal, pain or trouble when passing urine, general feeling of discomfort or being unwell Infusion reactions--chest pain, shortness of breath or trouble breathing, feeling faint or lightheaded Sudden eye pain or change in vision such as blurry vision, seeing halos around lights, vision loss Unusual bruising or bleeding Side effects that usually do not require medical attention (report to your care team if they continue or are bothersome): Constipation Diarrhea Fatigue Nausea Pain, tingling, or numbness in the hands or feet Swelling of the ankles, hands, or feet This list may not describe all possible side effects. Call your doctor for medical advice about side effects. You may report side effects to FDA at 1-800-FDA-1088. Where should I keep my  medication? This medication is given in a hospital or clinic. It will not be stored at home. NOTE: This sheet is a summary. It may not cover all possible information. If you have questions about this medicine, talk to your doctor, pharmacist, or health care provider.  2024 Elsevier/Gold Standard (2022-04-21 00:00:00)      To help prevent nausea and vomiting after your treatment, we encourage you to take your nausea medication as directed.  BELOW ARE SYMPTOMS THAT SHOULD BE REPORTED IMMEDIATELY: *FEVER GREATER THAN 100.4 F (38 C) OR HIGHER *CHILLS OR SWEATING *NAUSEA AND VOMITING THAT IS NOT CONTROLLED WITH YOUR NAUSEA MEDICATION *UNUSUAL SHORTNESS OF BREATH *UNUSUAL BRUISING OR BLEEDING *URINARY PROBLEMS (pain or burning when urinating, or frequent urination) *BOWEL PROBLEMS (unusual diarrhea, constipation, pain near the anus) TENDERNESS IN MOUTH AND THROAT WITH OR WITHOUT PRESENCE OF ULCERS (sore throat, sores in mouth, or a toothache) UNUSUAL RASH, SWELLING OR PAIN  UNUSUAL VAGINAL DISCHARGE OR ITCHING   Items with * indicate a potential emergency and should be followed up as soon as possible or go to the Emergency Department if any problems should occur.  Please show the CHEMOTHERAPY ALERT CARD or IMMUNOTHERAPY ALERT CARD at check-in to the Emergency Department and triage nurse.  Should you have questions after your visit or need to cancel or reschedule your appointment, please contact [facility] CANCER  CTR [place] - A DEPT OF Eligh[facility] Bridegroom [facility] [phone]  and follow the prompts.  Office hours are 8:00 a.m. to 4:30 p.m. Monday - Friday. Please note that voicemails left after 4:00 p.m. may not be returned until the following business day.  We are closed weekends and major holidays. You have access to a nurse at all times for urgent questions. Please call the main number to the clinic [phone] and follow the prompts.  For any non-urgent questions, you may also contact your  provider using MyChart. We now offer e-Visits for anyone 18 and older to request care online for non-urgent symptoms. For details visit [email or web address].   Also download the MyChart app! Go to the app store, search "MyChart", open the app, select [facility], and log in with your MyChart username and password.

## 2024-08-21 DIAGNOSIS — J320 Chronic maxillary sinusitis: Secondary | ICD-10-CM | POA: Diagnosis not present

## 2024-08-21 DIAGNOSIS — Z9889 Other specified postprocedural states: Secondary | ICD-10-CM | POA: Diagnosis not present

## 2024-08-23 ENCOUNTER — Other Ambulatory Visit: Payer: Self-pay | Admitting: *Deleted

## 2024-08-23 DIAGNOSIS — C9000 Multiple myeloma not having achieved remission: Secondary | ICD-10-CM

## 2024-08-23 MED ORDER — FOLIC ACID 1 MG PO TABS: 1.0000 mg | ORAL_TABLET | Freq: Every day | ORAL | 3 refills | Status: AC

## 2024-08-23 MED ORDER — GABAPENTIN 300 MG PO CAPS: 300.0000 mg | ORAL_CAPSULE | Freq: Three times a day (TID) | ORAL | 2 refills | Status: AC

## 2024-09-14 ENCOUNTER — Inpatient Hospital Stay: Attending: Hematology

## 2024-09-14 ENCOUNTER — Inpatient Hospital Stay

## 2024-09-14 ENCOUNTER — Encounter: Payer: Self-pay | Admitting: Oncology

## 2024-09-14 VITALS — BP 141/73 | HR 57 | Temp 97.9°F | Resp 18 | Wt 192.6 lb

## 2024-09-14 DIAGNOSIS — Z5112 Encounter for antineoplastic immunotherapy: Secondary | ICD-10-CM | POA: Insufficient documentation

## 2024-09-14 DIAGNOSIS — C9000 Multiple myeloma not having achieved remission: Secondary | ICD-10-CM

## 2024-09-14 DIAGNOSIS — D508 Other iron deficiency anemias: Secondary | ICD-10-CM

## 2024-09-14 DIAGNOSIS — D702 Other drug-induced agranulocytosis: Secondary | ICD-10-CM

## 2024-09-14 DIAGNOSIS — C7951 Secondary malignant neoplasm of bone: Secondary | ICD-10-CM

## 2024-09-14 DIAGNOSIS — Z170 Estrogen receptor positive status [ER+]: Secondary | ICD-10-CM

## 2024-09-14 LAB — COMPREHENSIVE METABOLIC PANEL WITH GFR
ALT: 11 U/L (ref 0–44)
AST: 18 U/L (ref 15–41)
Albumin: 3.2 g/dL — ABNORMAL LOW (ref 3.5–5.0)
Alkaline Phosphatase: 39 U/L (ref 38–126)
Anion gap: 7 (ref 5–15)
BUN: 12 mg/dL (ref 8–23)
CO2: 24 mmol/L (ref 22–32)
Calcium: 8.1 mg/dL — ABNORMAL LOW (ref 8.9–10.3)
Chloride: 106 mmol/L (ref 98–111)
Creatinine, Ser: 1.5 mg/dL — ABNORMAL HIGH (ref 0.61–1.24)
GFR, Estimated: 48 mL/min — ABNORMAL LOW (ref >60)
Glucose, Bld: 103 mg/dL — ABNORMAL HIGH (ref 70–99)
Potassium: 4.2 mmol/L (ref 3.5–5.1)
Sodium: 137 mmol/L (ref 135–145)
Total Bilirubin: 0.5 mg/dL (ref 0.0–1.2)
Total Protein: 6.1 g/dL — ABNORMAL LOW (ref 6.5–8.1)

## 2024-09-14 LAB — CBC WITH DIFFERENTIAL/PLATELET
Abs Immature Granulocytes: 0.02 K/uL (ref 0.00–0.07)
Basophils Absolute: 0.1 K/uL (ref 0.0–0.1)
Basophils Relative: 1 %
Eosinophils Absolute: 0.3 K/uL (ref 0.0–0.5)
Eosinophils Relative: 5 %
HCT: 34.9 % — ABNORMAL LOW (ref 39.0–52.0)
Hemoglobin: 11.5 g/dL — ABNORMAL LOW (ref 13.0–17.0)
Immature Granulocytes: 0 %
Lymphocytes Relative: 17 %
Lymphs Abs: 1.2 K/uL (ref 0.7–4.0)
MCH: 34.8 pg — ABNORMAL HIGH (ref 26.0–34.0)
MCHC: 33 g/dL (ref 30.0–36.0)
MCV: 105.8 fL — ABNORMAL HIGH (ref 80.0–100.0)
Monocytes Absolute: 0.5 K/uL (ref 0.1–1.0)
Monocytes Relative: 7 %
Neutro Abs: 5.1 K/uL (ref 1.7–7.7)
Neutrophils Relative %: 70 %
Platelets: 204 K/uL (ref 150–400)
RBC: 3.3 MIL/uL — ABNORMAL LOW (ref 4.22–5.81)
RDW: 13.2 % (ref 11.5–15.5)
WBC: 7.2 K/uL (ref 4.0–10.5)
nRBC: 0 % (ref 0.0–0.2)

## 2024-09-14 LAB — MAGNESIUM: Magnesium: 2 mg/dL (ref 1.7–2.4)

## 2024-09-14 MED ORDER — DARATUMUMAB-HYALURONIDASE-FIHJ 1800-30000 MG-UT/15ML ~~LOC~~ SOLN
1800.0000 mg | Freq: Once | SUBCUTANEOUS | Status: AC
  Administered 2024-09-14: 1800 mg via SUBCUTANEOUS
  Filled 2024-09-14: qty 15

## 2024-09-14 MED ORDER — DEXTROSE 5 % IV SOLN: INTRAVENOUS | Status: DC

## 2024-09-14 MED ORDER — IMMUNE GLOBULIN (HUMAN) 10 GM/100ML IV SOLN
40.0000 g | Freq: Once | INTRAVENOUS | Status: AC
  Administered 2024-09-14: 40 g via INTRAVENOUS
  Filled 2024-09-14: qty 400

## 2024-09-14 MED ORDER — CETIRIZINE HCL 10 MG/ML IV SOLN
10.0000 mg | Freq: Once | INTRAVENOUS | Status: AC
  Administered 2024-09-14: 10 mg via INTRAVENOUS
  Filled 2024-09-14: qty 1

## 2024-09-14 MED ORDER — FAMOTIDINE IN NACL 20-0.9 MG/50ML-% IV SOLN
20.0000 mg | Freq: Once | INTRAVENOUS | Status: AC
  Administered 2024-09-14: 20 mg via INTRAVENOUS
  Filled 2024-09-14: qty 50

## 2024-09-14 NOTE — Progress Notes (Signed)
 Patient presents today for Darzalex  Faspro injection and IVIG infusion.  Patient is in satisfactory condition with no new complaints voiced.  Vital signs are stable.  Labs reviewed and all labs are within treatment parameters.  We will proceed with treatment per MD orders.    Patient took Dexathasone 20 mg and Tylenol  650 mg prior to arrival.  Treatment given today per MD orders. Tolerated infusion without adverse affects. Vital signs stable. No complaints at this time. Discharged from clinic ambulatory in stable condition. Alert and oriented x 3. F/U with [facility] as scheduled.

## 2024-09-14 NOTE — Patient Instructions (Signed)
 [facility] [place] - A DEPT OF [place]  Discharge Instructions: Thank you for choosing [facility] to provide your oncology and hematology care.  If you have a lab appointment with the Cancer Center - please note that after April 8th, 2024, all labs will be drawn in the cancer center.  You do not have to check in or register with the main entrance as you have in the past but will complete your check-in in the cancer center.  Wear comfortable clothing and clothing appropriate for easy access to any Portacath or PICC line.   We strive to give you quality time with your provider. You may need to reschedule your appointment if you arrive late (15 or more minutes).  Arriving late affects you and other patients whose appointments are after yours.  Also, if you miss three or more appointments without notifying the office, you may be dismissed from the clinic at the provider's discretion.      For prescription refill requests, have your pharmacy contact our office and allow 72 hours for refills to be completed.    Today you received the following chemotherapy and/or immunotherapy agents [name]/IVIG      To help prevent nausea and vomiting after your treatment, we encourage you to take your nausea medication as directed.  BELOW ARE SYMPTOMS THAT SHOULD BE REPORTED IMMEDIATELY: *FEVER GREATER THAN 100.4 F (38 C) OR HIGHER *CHILLS OR SWEATING *NAUSEA AND VOMITING THAT IS NOT CONTROLLED WITH YOUR NAUSEA MEDICATION *UNUSUAL SHORTNESS OF BREATH *UNUSUAL BRUISING OR BLEEDING *URINARY PROBLEMS (pain or burning when urinating, or frequent urination) *BOWEL PROBLEMS (unusual diarrhea, constipation, pain near the anus) TENDERNESS IN MOUTH AND THROAT WITH OR WITHOUT PRESENCE OF ULCERS (sore throat, sores in mouth, or a toothache) UNUSUAL RASH, SWELLING OR PAIN  UNUSUAL VAGINAL DISCHARGE OR ITCHING   Items with * indicate a potential emergency and should be  followed up as soon as possible or go to the Emergency Department if any problems should occur.  Please show the CHEMOTHERAPY ALERT CARD or IMMUNOTHERAPY ALERT CARD at check-in to the Emergency Department and triage nurse.  Should you have questions after your visit or need to cancel or reschedule your appointment, please contact [facility] [place] - A DEPT OF [facility] [phone]  and follow the prompts.  Office hours are 8:00 a.m. to 4:30 p.m. Monday - Friday. Please note that voicemails left after 4:00 p.m. may not be returned until the following business day.  We are closed weekends and major holidays. You have access to a nurse at all times for urgent questions. Please call the main number to the clinic [phone] and follow the prompts.  For any non-urgent questions, you may also contact your provider using MyChart. We now offer e-Visits for anyone 41 and older to request care online for non-urgent symptoms. For details visit [email or web address].   Also download the MyChart app! Go to the app store, search MyChart, open the app, select [place], and log in with your MyChart username and password.

## 2024-09-19 DIAGNOSIS — R220 Localized swelling, mass and lump, head: Secondary | ICD-10-CM | POA: Diagnosis not present

## 2024-09-19 DIAGNOSIS — Z6827 Body mass index (BMI) 27.0-27.9, adult: Secondary | ICD-10-CM | POA: Diagnosis not present

## 2024-09-19 DIAGNOSIS — J0101 Acute recurrent maxillary sinusitis: Secondary | ICD-10-CM | POA: Diagnosis not present

## 2024-09-19 DIAGNOSIS — I6523 Occlusion and stenosis of bilateral carotid arteries: Secondary | ICD-10-CM | POA: Diagnosis not present

## 2024-09-19 DIAGNOSIS — J328 Other chronic sinusitis: Secondary | ICD-10-CM | POA: Diagnosis not present

## 2024-09-27 DIAGNOSIS — M8710 Osteonecrosis due to drugs, unspecified bone: Secondary | ICD-10-CM | POA: Diagnosis not present

## 2024-09-27 DIAGNOSIS — Z9889 Other specified postprocedural states: Secondary | ICD-10-CM | POA: Diagnosis not present

## 2024-09-27 DIAGNOSIS — C9000 Multiple myeloma not having achieved remission: Secondary | ICD-10-CM | POA: Diagnosis not present

## 2024-09-27 DIAGNOSIS — H04301 Unspecified dacryocystitis of right lacrimal passage: Secondary | ICD-10-CM | POA: Diagnosis not present

## 2024-09-27 DIAGNOSIS — J320 Chronic maxillary sinusitis: Secondary | ICD-10-CM | POA: Diagnosis not present

## 2024-10-05 ENCOUNTER — Other Ambulatory Visit: Payer: Self-pay | Admitting: Oncology

## 2024-10-05 DIAGNOSIS — C9000 Multiple myeloma not having achieved remission: Secondary | ICD-10-CM

## 2024-10-06 ENCOUNTER — Inpatient Hospital Stay: Attending: Hematology

## 2024-10-06 DIAGNOSIS — Z7961 Long term (current) use of immunomodulator: Secondary | ICD-10-CM | POA: Insufficient documentation

## 2024-10-06 DIAGNOSIS — G629 Polyneuropathy, unspecified: Secondary | ICD-10-CM | POA: Insufficient documentation

## 2024-10-06 DIAGNOSIS — J320 Chronic maxillary sinusitis: Secondary | ICD-10-CM | POA: Insufficient documentation

## 2024-10-06 DIAGNOSIS — Z79899 Other long term (current) drug therapy: Secondary | ICD-10-CM | POA: Insufficient documentation

## 2024-10-06 DIAGNOSIS — D539 Nutritional anemia, unspecified: Secondary | ICD-10-CM | POA: Insufficient documentation

## 2024-10-06 DIAGNOSIS — C9002 Multiple myeloma in relapse: Secondary | ICD-10-CM | POA: Diagnosis not present

## 2024-10-06 DIAGNOSIS — C9000 Multiple myeloma not having achieved remission: Secondary | ICD-10-CM

## 2024-10-06 DIAGNOSIS — D801 Nonfamilial hypogammaglobulinemia: Secondary | ICD-10-CM | POA: Insufficient documentation

## 2024-10-06 DIAGNOSIS — Z7952 Long term (current) use of systemic steroids: Secondary | ICD-10-CM | POA: Diagnosis not present

## 2024-10-06 DIAGNOSIS — Z5112 Encounter for antineoplastic immunotherapy: Secondary | ICD-10-CM | POA: Diagnosis not present

## 2024-10-06 NOTE — Progress Notes (Signed)
 Christopher JULIANNA Monte presented for Portacath access and flush.  Portacath located left chest wall accessed with  H 20 needle.  Good blood return present. Lab work drawn for Myeloma panel for upcoming physician appointment. Portacath flushed with 20 mls of normal saline and needle removed intact.  Procedure tolerated well and without incident. Discharged from clinic ambulatory in stable condition. Alert and oriented x 3. F/U with [facility] as scheduled.

## 2024-10-09 LAB — KAPPA/LAMBDA LIGHT CHAINS
Kappa free light chain: 1.7 mg/L — ABNORMAL LOW (ref 3.3–19.4)
Kappa, lambda light chain ratio: 0.85 (ref 0.26–1.65)
Lambda free light chains: 2 mg/L — ABNORMAL LOW (ref 5.7–26.3)

## 2024-10-10 LAB — PROTEIN ELECTROPHORESIS, SERUM
A/G Ratio: 1.2 (ref 0.7–1.7)
Albumin ELP: 3.2 g/dL (ref 2.9–4.4)
Alpha-1-Globulin: 0.3 g/dL (ref 0.0–0.4)
Alpha-2-Globulin: 0.7 g/dL (ref 0.4–1.0)
Beta Globulin: 0.9 g/dL (ref 0.7–1.3)
Gamma Globulin: 0.8 g/dL (ref 0.4–1.8)
Globulin, Total: 2.7 g/dL (ref 2.2–3.9)
Total Protein ELP: 5.9 g/dL — ABNORMAL LOW (ref 6.0–8.5)

## 2024-10-11 LAB — IMMUNOFIXATION ELECTROPHORESIS
IgA: 10 mg/dL — ABNORMAL LOW (ref 61–437)
IgG (Immunoglobin G), Serum: 932 mg/dL (ref 603–1613)
IgM (Immunoglobulin M), Srm: 7 mg/dL — ABNORMAL LOW (ref 15–143)
Total Protein ELP: 5.8 g/dL — ABNORMAL LOW (ref 6.0–8.5)

## 2024-10-12 NOTE — Progress Notes (Signed)
 Patient Care Team: Rogers Hai, MD (Inactive) as PCP - General (Hematology)  Clinic Day:  10/12/2024  Referring physician: Rogers Hai, MD   CHIEF COMPLAINT:  CC: Relapsed IgG kappa multiple myeloma   Johnny Lucas 78 y.o. male was transferred to my care after his prior physician has left.   ASSESSMENT & PLAN:   Assessment & Plan: Johnny Lucas  is a 78 y.o. male with multiple myeloma  Assessment and Plan Assessment & Plan Relapsed multiple myeloma  Initial diagnosis in 2019 followed by Ortho stem cell transplant.  Extensive oncology history below Currently on dexamethasone  and daratumumab  maintenance.  - Patient tolerating treatment well with no side effects. - Labs reviewed today:CMP: Creatinine: 1.49-stable, normal LFTs, CBC: Hemoglobin: 11.3, platelets: 142, WBC: 5.9.  Multiple myeloma labs: M spike: Not observed, normal free light chains with a normal ratio. -Physical exam stable today.  Will proceed with treatment today. - Continue daratumumab  and dexamethasone  monthly - Continue acyclovir  for viral prophylaxis - Pomalidomide  and denosumab  are on hold for oral fistula  Return to clinic in 12 weeks with labs.  Multiple myeloma bone disease Patient previously on denosumab .  Held for oral antral fistula.  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  Patient reports no complaints today.  Macrocytic anemia Will obtain anemia panel today and replace as needed  Peripheral neuropathy Stable today.  - Continue gabapentin  600 mg 3 times daily  Hypogammaglobulinemia Started on IVIG 04/27/2024.  Current IgG is 932.  - Can hold IVIG at this time.  Will restart if IgG is low    The patient understands the plans discussed today and is in agreement with them.  He knows to contact our office if he develops concerns prior to his next appointment.  60 minutes of total time was spent for this patient encounter,  including preparation,review of records,  face-to-face counseling with the patient and coordination of care, physical exam, and documentation of the encounter.    LILLETTE Verneta SAUNDERS Teague,acting as a Neurosurgeon for Mickiel Dry, MD.,have documented all relevant documentation on the behalf of Mickiel Dry, MD,as directed by  Mickiel Dry, MD while in the presence of Mickiel Dry, MD.  I, Mickiel Dry MD, have reviewed the above documentation for accuracy and completeness, and I agree with the above.    Verneta SAUNDERS Ege  [facility] [place] - A DEPT OF [facility] [address] [name] Dept: [phone] Dept Fax: [phone]   No orders of the defined types were placed in this encounter.    ONCOLOGY HISTORY:   I have reviewed his chart and materials related to his cancer extensively and collaborated history with the patient. Summary of oncologic history is as follows:   Diagnosis: Relapsed IgG kappa multiple myeloma   -Patient has a history of stage IV left breast cancer (2011 diagnosis s/p tamoxifen  and denosumab ).  -12/31/2016: PET:  Multifocal hypermetabolic osseous metastases throughout the axial and proximal appendicular skeleton, which are increased in size, number and metabolism. 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 No additional sites of hypermetabolic metastatic disease. -98/83/7981: Left ilium bone biopsy.  Pathology: Plasma cell neoplasm. Sections of bone show marrow space essentially replaced by sheets of atypical plasma cells. The cells stain for CD138 and are monotypic for kappa light chain by in situ hybridization. Pancytokeratin  is negative. -01/18/2017: SPEP: M-spike 2.3. Gamma Globulin 2.7. KFLC elevated at 2,129.1 with FLC ratio of 272.96. IgG 3,234 with immunofixation  showing IgG monoclonal protein with kappa light chain specificity. Beta-2  Microglobulin 3.3. Calcium  8.7. Total Protein 8.4. HGB 11.1.  -02/02/2017: Bone Marrow Biopsy.  Pathology: The marrow is variably cellular with large peritrabecular aggregates of kappa restricted plasma cells (66% by aspirate, 30% by CD138). These findings are consistent with plasma cell myeloma.  Chromosome Analysis: Normal male karyotype MM FISH: Gain of ATM.  Negative gain/loss of 13 q., gain/loss of 12, TP 53, CCND1/IgH. -03/01/2017-06/29/2017: 6 cycles of RVD -05/26/2017: Bone marrow biopsy at [facility]:  Plasma cell myeloma in a 30% cellular marrow with decreased trilineage hematopoiesis and 42% kappa light chain restricted plasma cells on the aspirate smears and large aggregates on the core biopsy.   -07/12/2017-08/31/2017: 3 cycles of KCD -10/08/2017: Autologous stem cell transplant  -11/02/2017: MM Panel: M-spike 0.3 -01/19/2018: Bone Marrow Biopsy.  Pathology: Normocellular marrow (20-30%) with a slight increase in plasma cells (2%).  -02/14/2018-03/29/2018: Post transplant consolidation with 2 cycles of KCD -04/20/2018-02/06/2020: Maintenance carfilzomib  70 mg/m2 days 1 and 15 every 28 days, dose reduced to 34 mg/m2 on 01/05/2019, dose increased to 58 mg/m2 on 01/09/2020 -01/25/2020: Excision of left anterior axillary mass. Pathology: Consistent with multiple myeloma, Kappa-restricted.  -02/12/2020: PET: New medial left supraclavicular node at the thoracic inlet, suspicious for nodal metastasis. Marked improvement in osseous metastases. -02/14/2020: Bone Marrow Biopsy.  Pathology: Plasma cell myeloma (60-70% by CD138), kappa light chain restricted, recurrent. Flow confirms Kappa monotypic plasma cells.  Chromosome Analysis: Normal MM FISH Panel: Negative -02/28/2020-Current:Daratumumab , bortezomib  and dexamethasone  completed -06/2020: Pomalidomide  dose reduced to 2 mg days 1-21 secondary to severe  weakness -03/2024: Pomalidomide  held as patient developed an oroantral fistula -Currently on daratumumab  and dexamethasone  maintenance -2021-current: Patient's myeloma remains stable with M-spike ranging from 0.1 to not observed and FLC ratio normalized -02/16/2024-04/26/2024: Pomalidomide  and Darzalex  held due to hospitalization, right partial maxillectomy and extraction of remaining right maxillary molar, first premolar and canine, requiring prolonged antibiotics    Current Treatment:  Daratumumab  and dexamethasone  maintenance monthly  INTERVAL HISTORY:   Discussed the use of AI scribe software for clinical note transcription with the patient, who gave verbal consent to proceed.  History of Present Illness EDOARDO LAFORTE is a 78 year old male with multiple myeloma who presents for routine follow-up and to establish care with me.  He is undergoing treatment for multiple myeloma with daratumumab  and dexamethasone   In September, he experienced a bacterial infection in his mouth following dental extractions, which led to the temporary cessation of certain medications-denosumab  and Pomalyst . This issue was addressed with sinus surgery and a six-week course of antibiotics.  He is currently taking Acyclovir  to prevent viral reactivation associated with his treatment regimen, specifically to prevent infections like shingles. He confirms adherence to this medication.  No recent infections, cough, cold, fever, or chills. His appetite remains good.    I have reviewed the past medical history, past surgical history, social history and family history with the patient and they are unchanged from previous note.  ALLERGIES:  is allergic to cefepime .  MEDICATIONS:  Current Outpatient Medications  Medication Sig Dispense Refill   acetaminophen  (TYLENOL ) 500 MG tablet Take 1,000 mg by mouth every 8 (eight) hours as needed for mild pain (pain score 1-3) or moderate pain (pain score 4-6).      acyclovir  (ZOVIRAX ) 800 MG tablet TAKE 1 TABLET TWICE  DAILY 180 tablet 3   albuterol  (VENTOLIN  HFA) 108 (90 Base) MCG/ACT inhaler Inhale 2 puffs into the lungs every 6 (six) hours as needed for wheezing or shortness of breath.     ALPRAZolam  (XANAX ) 0.5 MG tablet TAKE 1 TABLET BY MOUTH AT BEDTIME AS NEEDED FOR anxiety / SLEEP 30 tablet 5   aspirin  EC 81 MG tablet Take 81 mg by mouth daily.      cetirizine  (ZYRTEC ) 10 MG tablet Take 10 mg by mouth daily.     chlorhexidine  (PERIDEX ) 0.12 % solution Swish and spit after meals and before bed (Total 4 times daily) for 10 days. 473 mL 1   DAPTOmycin  (CUBICIN ) 500 MG injection Inject into the vein.     dexamethasone  (DECADRON ) 4 MG tablet Take 20 mg by mouth every 30 (thirty) days.     folic acid  (FOLVITE ) 1 MG tablet Take 1 tablet (1 mg total) by mouth daily. 90 tablet 3   gabapentin  (NEURONTIN ) 300 MG capsule Take 1 capsule (300 mg total) by mouth 3 (three) times daily. 180 capsule 2   metoprolol  succinate (TOPROL -XL) 100 MG 24 hr tablet Take 100 mg by mouth in the morning.     mupirocin  ointment (BACTROBAN ) 2 % Apply topically.     pantoprazole  (PROTONIX ) 40 MG tablet TAKE 1 TABLET EVERY OTHER DAY 45 tablet 3   POMALYST  2 MG capsule Take 1 capsule (2 mg total) by mouth daily. 21 capsule 0   tamoxifen  (NOLVADEX ) 20 MG tablet TAKE 1 TABLET EVERY DAY 90 tablet 3   No current facility-administered medications for this visit.   Facility-Administered Medications Ordered in Other Visits  Medication Dose Route Frequency Provider Last Rate Last Admin   ondansetron  (ZOFRAN ) 8 mg in sodium chloride  0.9 % 50 mL IVPB  8 mg Intravenous Once Lockamy, Randi L, NP-C       sodium chloride  flush (NS) 0.9 % injection 10 mL  10 mL Intracatheter Once PRN Rogers Hai, MD        REVIEW OF SYSTEMS:   Constitutional: Denies fevers, chills or abnormal weight loss Eyes: Denies blurriness of vision Ears, nose, mouth, throat, and face: Denies mucositis or sore  throat Respiratory: Denies cough, dyspnea or wheezes Cardiovascular: Denies palpitation, chest discomfort or lower extremity swelling Gastrointestinal:  Denies nausea, heartburn or change in bowel habits Skin: Denies abnormal skin rashes Lymphatics: Denies new lymphadenopathy or easy bruising Neurological:Denies numbness, tingling or new weaknesses Behavioral/Psych: Mood is stable, no new changes  All other systems were reviewed with the patient and are negative.   VITALS:  There were no vitals taken for this visit.  Wt Readings from Last 3 Encounters:  09/14/24 192 lb 9.6 oz (87.4 kg)  08/17/24 195 lb (88.5 kg)  07/20/24 196 lb (88.9 kg)    There is no height or weight on file to calculate BMI.  Performance status (ECOG): 2 - Symptomatic, <50% confined to bed  PHYSICAL EXAM:   GENERAL:alert, no distress and comfortable SKIN: skin color, texture, turgor are normal, no rashes or significant lesions LYMPH:  no palpable lymphadenopathy in the cervical, axillary or inguinal LUNGS: clear to auscultation and percussion with normal breathing effort HEART: regular rate & rhythm and no murmurs and no lower extremity edema ABDOMEN:abdomen soft, non-tender and normal bowel sounds Musculoskeletal:no cyanosis of digits and no clubbing  NEURO: alert & oriented x 3 with fluent speech, no focal motor/sensory deficits  LABORATORY DATA:  I have reviewed the data as listed  Lab Results  Component Value Date   WBC 7.2 09/14/2024   NEUTROABS 5.1 09/14/2024   HGB 11.5 (L) 09/14/2024   HCT 34.9 (L) 09/14/2024   MCV 105.8 (H) 09/14/2024   PLT 204 09/14/2024     Chemistry      Component Value Date/Time   NA 139 10/13/2024 0953   K 4.3 10/13/2024 0953   CL 104 10/13/2024 0953   CO2 26 10/13/2024 0953   BUN 12 10/13/2024 0953   CREATININE 1.49 (H) 10/13/2024 0953      Component Value Date/Time   CALCIUM  8.6 (L) 10/13/2024 0953   ALKPHOS 42 10/13/2024 0953   AST 22 10/13/2024 0953    ALT 12 10/13/2024 0953   BILITOT 0.4 10/13/2024 0953       Latest Reference Range & Units 10/06/24 10:37  Total Protein ELP 6.0 - 8.5 g/dL 6.0 - 8.5 g/dL 5.8 (L) 5.9 (L)  Albumin  ELP 2.9 - 4.4 g/dL 3.2  Globulin, Total 2.2 - 3.9 g/dL 2.7 (C)  A/G Ratio 0.7 - 1.7  1.2 (C)  Alpha-1-Globulin 0.0 - 0.4 g/dL 0.3  Joeyj-7-Honalopw 0.4 - 1.0 g/dL 0.7  Beta Globulin 0.7 - 1.3 g/dL 0.9  Gamma Globulin 0.4 - 1.8 g/dL 0.8  M-SPIKE, % Not Observed g/dL Not Observed  SPE Interp.  Comment  Comment  Comment  IgG (Immunoglobin G), Serum 603 - 1,613 mg/dL 067  IgM (Immunoglobulin M), Srm 15 - 143 mg/dL 7 (L)  IgA 61 - 562 mg/dL 10 (L)  (L): Data is abnormally low (C): Corrected   Latest Reference Range & Units 10/06/24 10:37  Kappa free light chain 3.3 - 19.4 mg/L 1.7 (L)  Lambda free light chains 5.7 - 26.3 mg/L 2.0 (L)  Kappa, lambda light chain ratio 0.26 - 1.65  0.85  Immunofixation Result, Serum  Comment (C)  (L): Data is abnormally low (C): Corrected  RADIOGRAPHIC STUDIES: I have personally reviewed the radiological images as listed and agreed with the findings in the report.

## 2024-10-13 ENCOUNTER — Other Ambulatory Visit: Payer: Self-pay

## 2024-10-13 ENCOUNTER — Inpatient Hospital Stay: Admitting: Oncology

## 2024-10-13 ENCOUNTER — Inpatient Hospital Stay

## 2024-10-13 VITALS — BP 139/83 | HR 66 | Temp 98.4°F | Resp 18 | Wt 194.9 lb

## 2024-10-13 DIAGNOSIS — G620 Drug-induced polyneuropathy: Secondary | ICD-10-CM | POA: Diagnosis not present

## 2024-10-13 DIAGNOSIS — C9000 Multiple myeloma not having achieved remission: Secondary | ICD-10-CM | POA: Diagnosis not present

## 2024-10-13 DIAGNOSIS — J320 Chronic maxillary sinusitis: Secondary | ICD-10-CM

## 2024-10-13 DIAGNOSIS — Z5112 Encounter for antineoplastic immunotherapy: Secondary | ICD-10-CM | POA: Diagnosis not present

## 2024-10-13 DIAGNOSIS — D801 Nonfamilial hypogammaglobulinemia: Secondary | ICD-10-CM | POA: Diagnosis not present

## 2024-10-13 DIAGNOSIS — G629 Polyneuropathy, unspecified: Secondary | ICD-10-CM | POA: Diagnosis not present

## 2024-10-13 DIAGNOSIS — D649 Anemia, unspecified: Secondary | ICD-10-CM

## 2024-10-13 DIAGNOSIS — D539 Nutritional anemia, unspecified: Secondary | ICD-10-CM | POA: Diagnosis not present

## 2024-10-13 DIAGNOSIS — T451X5A Adverse effect of antineoplastic and immunosuppressive drugs, initial encounter: Secondary | ICD-10-CM

## 2024-10-13 DIAGNOSIS — C9002 Multiple myeloma in relapse: Secondary | ICD-10-CM | POA: Diagnosis not present

## 2024-10-13 LAB — CBC WITH DIFFERENTIAL/PLATELET
Abs Immature Granulocytes: 0 K/uL (ref 0.00–0.07)
Basophils Absolute: 0 K/uL (ref 0.0–0.1)
Basophils Relative: 1 %
Eosinophils Absolute: 0.2 K/uL (ref 0.0–0.5)
Eosinophils Relative: 4 %
HCT: 33.6 % — ABNORMAL LOW (ref 39.0–52.0)
Hemoglobin: 11.3 g/dL — ABNORMAL LOW (ref 13.0–17.0)
Immature Granulocytes: 0 %
Lymphocytes Relative: 20 %
Lymphs Abs: 1.2 K/uL (ref 0.7–4.0)
MCH: 34.6 pg — ABNORMAL HIGH (ref 26.0–34.0)
MCHC: 33.6 g/dL (ref 30.0–36.0)
MCV: 102.8 fL — ABNORMAL HIGH (ref 80.0–100.0)
Monocytes Absolute: 0.7 K/uL (ref 0.1–1.0)
Monocytes Relative: 12 %
Neutro Abs: 3.8 K/uL (ref 1.7–7.7)
Neutrophils Relative %: 63 %
Platelets: 142 K/uL — ABNORMAL LOW (ref 150–400)
RBC: 3.27 MIL/uL — ABNORMAL LOW (ref 4.22–5.81)
RDW: 13.2 % (ref 11.5–15.5)
WBC: 5.9 K/uL (ref 4.0–10.5)
nRBC: 0 % (ref 0.0–0.2)

## 2024-10-13 LAB — COMPREHENSIVE METABOLIC PANEL WITH GFR
ALT: 12 U/L (ref 0–44)
AST: 22 U/L (ref 15–41)
Albumin: 3.8 g/dL (ref 3.5–5.0)
Alkaline Phosphatase: 42 U/L (ref 38–126)
Anion gap: 8 (ref 5–15)
BUN: 12 mg/dL (ref 8–23)
CO2: 26 mmol/L (ref 22–32)
Calcium: 8.6 mg/dL — ABNORMAL LOW (ref 8.9–10.3)
Chloride: 104 mmol/L (ref 98–111)
Creatinine, Ser: 1.49 mg/dL — ABNORMAL HIGH (ref 0.61–1.24)
GFR, Estimated: 48 mL/min — ABNORMAL LOW (ref >60)
Glucose, Bld: 110 mg/dL — ABNORMAL HIGH (ref 70–99)
Potassium: 4.3 mmol/L (ref 3.5–5.1)
Sodium: 139 mmol/L (ref 135–145)
Total Bilirubin: 0.4 mg/dL (ref 0.0–1.2)
Total Protein: 6 g/dL — ABNORMAL LOW (ref 6.5–8.1)

## 2024-10-13 LAB — VITAMIN B12: Vitamin B-12: 302 pg/mL (ref 180–914)

## 2024-10-13 LAB — IRON AND TIBC
Iron: 87 ug/dL (ref 45–182)
Saturation Ratios: 28 % (ref 17.9–39.5)
TIBC: 308 ug/dL (ref 250–450)
UIBC: 221 ug/dL

## 2024-10-13 LAB — MAGNESIUM: Magnesium: 2.2 mg/dL (ref 1.7–2.4)

## 2024-10-13 LAB — FOLATE: Folate: >20 ng/mL (ref >5.9)

## 2024-10-13 LAB — FERRITIN: Ferritin: 405 ng/mL — ABNORMAL HIGH (ref 24–336)

## 2024-10-13 MED ORDER — DARATUMUMAB-HYALURONIDASE-FIHJ 1800-30000 MG-UT/15ML ~~LOC~~ SOLN
1800.0000 mg | Freq: Once | SUBCUTANEOUS | Status: AC
  Administered 2024-10-13: 1800 mg via SUBCUTANEOUS
  Filled 2024-10-13: qty 15

## 2024-10-13 NOTE — Patient Instructions (Signed)
 [facility] [place] - A DEPT OF [facility]  Discharge Instructions: Thank you for choosing [facility] to provide your oncology and hematology care.  If you have a lab appointment with the Cancer Center - please note that after April 8th, 2024, all labs will be drawn in the cancer center.  You do not have to check in or register with the main entrance as you have in the past but will complete your check-in in the cancer center.  Wear comfortable clothing and clothing appropriate for easy access to any Portacath or PICC line.   We strive to give you quality time with your provider. You may need to reschedule your appointment if you arrive late (15 or more minutes).  Arriving late affects you and other patients whose appointments are after yours.  Also, if you miss three or more appointments without notifying the office, you may be dismissed from the clinic at the provider's discretion.      For prescription refill requests, have your pharmacy contact our office and allow 72 hours for refills to be completed.    Today you received the following chemotherapy and/or immunotherapy agents Darzalex Faspro. Daratumumab; Hyaluronidase Injection What is this medication? DARATUMUMAB; HYALURONIDASE (dar a toom ue mab; hye al ur ON i dase) treats multiple myeloma, a type of bone marrow cancer. Daratumumab works by blocking a protein that causes cancer cells to grow and multiply. This helps to slow or stop the spread of cancer cells. Hyaluronidase works by increasing the absorption of other medications in the body to help them work better. This medication may also be used treat amyloidosis, a condition that causes the buildup of a protein (amyloid) in your body. It works by reducing the buildup of this protein, which decreases symptoms. It is a combination medication that contains a monoclonal antibody. This medicine may be used for other purposes; ask your health care  provider or pharmacist if you have questions. COMMON BRAND NAME(S): DARZALEX FASPRO What should I tell my care team before I take this medication? They need to know if you have any of these conditions: Heart disease Infection, such as chickenpox, cold sores, herpes, hepatitis B Lung or breathing disease An unusual or allergic reaction to daratumumab, hyaluronidase, other medications, foods, dyes, or preservatives Pregnant or trying to get pregnant Breast-feeding How should I use this medication? This medication is injected under the skin. It is given by your care team in a hospital or clinic setting. Talk to your care team about the use of this medication in children. Special care may be needed. Overdosage: If you think you have taken too much of this medicine contact a poison control center or emergency room at once. NOTE: This medicine is only for you. Do not share this medicine with others. What if I miss a dose? Keep appointments for follow-up doses. It is important not to miss your dose. Call your care team if you are unable to keep an appointment. What may interact with this medication? Interactions have not been studied. This list may not describe all possible interactions. Give your health care provider a list of all the medicines, herbs, non-prescription drugs, or dietary supplements you use. Also tell them if you smoke, drink alcohol, or use illegal drugs. Some items may interact with your medicine. What should I watch for while using this medication? Your condition will be monitored carefully while you are receiving this medication. This medication can cause serious allergic  reactions. To reduce your risk, your care team may give you other medication to take before receiving this one. Be sure to follow the directions from your care team. This medication can affect the results of blood tests to match your blood type. These changes can last for up to 6 months after the final dose.  Your care team will do blood tests to match your blood type before you start treatment. Tell all of your care team that you are being treated with this medication before receiving a blood transfusion. This medication can affect the results of some tests used to determine treatment response; extra tests may be needed to evaluate response. Talk to your care team if you wish to become pregnant or think you are pregnant. This medication can cause serious birth defects if taken during pregnancy and for 3 months after the last dose. A reliable form of contraception is recommended while taking this medication and for 3 months after the last dose. Talk to your care team about effective forms of contraception. Do not breast-feed while taking this medication. What side effects may I notice from receiving this medication? Side effects that you should report to your care team as soon as possible: Allergic reactions--skin rash, itching, hives, swelling of the face, lips, tongue, or throat Heart rhythm changes--fast or irregular heartbeat, dizziness, feeling faint or lightheaded, chest pain, trouble breathing Infection--fever, chills, cough, sore throat, wounds that don't heal, pain or trouble when passing urine, general feeling of discomfort or being unwell Infusion reactions--chest pain, shortness of breath or trouble breathing, feeling faint or lightheaded Sudden eye pain or change in vision such as blurry vision, seeing halos around lights, vision loss Unusual bruising or bleeding Side effects that usually do not require medical attention (report to your care team if they continue or are bothersome): Constipation Diarrhea Fatigue Nausea Pain, tingling, or numbness in the hands or feet Swelling of the ankles, hands, or feet This list may not describe all possible side effects. Call your doctor for medical advice about side effects. You may report side effects to FDA at 1-800-FDA-1088. Where should I keep my  medication? This medication is given in a hospital or clinic. It will not be stored at home. NOTE: This sheet is a summary. It may not cover all possible information. If you have questions about this medicine, talk to your doctor, pharmacist, or health care provider.  2024 Elsevier/Gold Standard (2022-04-21 00:00:00)      To help prevent nausea and vomiting after your treatment, we encourage you to take your nausea medication as directed.  BELOW ARE SYMPTOMS THAT SHOULD BE REPORTED IMMEDIATELY: *FEVER GREATER THAN 100.4 F (38 C) OR HIGHER *CHILLS OR SWEATING *NAUSEA AND VOMITING THAT IS NOT CONTROLLED WITH YOUR NAUSEA MEDICATION *UNUSUAL SHORTNESS OF BREATH *UNUSUAL BRUISING OR BLEEDING *URINARY PROBLEMS (pain or burning when urinating, or frequent urination) *BOWEL PROBLEMS (unusual diarrhea, constipation, pain near the anus) TENDERNESS IN MOUTH AND THROAT WITH OR WITHOUT PRESENCE OF ULCERS (sore throat, sores in mouth, or a toothache) UNUSUAL RASH, SWELLING OR PAIN  UNUSUAL VAGINAL DISCHARGE OR ITCHING   Items with * indicate a potential emergency and should be followed up as soon as possible or go to the Emergency Department if any problems should occur.  Please show the CHEMOTHERAPY ALERT CARD or IMMUNOTHERAPY ALERT CARD at check-in to the Emergency Department and triage nurse.  Should you have questions after your visit or need to cancel or reschedule your appointment, please contact [facility] CANCER  CTR [place] - A DEPT OF Eligh[facility] Bridegroom [facility] [phone]  and follow the prompts.  Office hours are 8:00 a.m. to 4:30 p.m. Monday - Friday. Please note that voicemails left after 4:00 p.m. may not be returned until the following business day.  We are closed weekends and major holidays. You have access to a nurse at all times for urgent questions. Please call the main number to the clinic [phone] and follow the prompts.  For any non-urgent questions, you may also contact your  provider using MyChart. We now offer e-Visits for anyone 18 and older to request care online for non-urgent symptoms. For details visit [email or web address].   Also download the MyChart app! Go to the app store, search "MyChart", open the app, select [facility], and log in with your MyChart username and password.

## 2024-10-13 NOTE — Progress Notes (Signed)
 Patient presents today for treatment and follow up visit with Dr. Davonna. Vital signs and lab work are within parameters for treatment.   Patient took pre-medications prior to arrival.   Darzalex  faspro given today per MD orders. Tolerated without adverse affects. Vital signs stable. No complaints at this time. Discharged from clinic ambulatory in stable condition. Alert and oriented x 3. F/U with [facility] as scheduled.

## 2024-10-14 ENCOUNTER — Encounter (HOSPITAL_COMMUNITY): Payer: Self-pay | Admitting: Oncology

## 2024-10-14 ENCOUNTER — Encounter: Payer: Self-pay | Admitting: Oncology

## 2024-10-14 MED ORDER — ALPRAZOLAM 0.5 MG PO TABS: ORAL_TABLET | ORAL | 5 refills | Status: AC

## 2024-10-15 ENCOUNTER — Other Ambulatory Visit: Payer: Self-pay

## 2024-10-19 DIAGNOSIS — C9000 Multiple myeloma not having achieved remission: Secondary | ICD-10-CM | POA: Diagnosis not present

## 2024-10-22 ENCOUNTER — Other Ambulatory Visit: Payer: Self-pay

## 2024-10-25 NOTE — Progress Notes (Signed)
 MYELOMA AND PLASMA CELL DISORDER CLINIC [facility] RETURN PATIENT VISIT   Name: Johnny Lucas MRN: [id number] Date: 10/25/2024  PCP: Franky SHAUNNA Barrio, MD  DIAGNOSIS: IgG kappa multiple myeloma Date of diagnosis: 01/2017 Autologous SCT: 10/08/2017 Current disease status: VGPR Current treatment: daratumumab , pomalidomide , dexamethasone  (started 05/28/20)  2)  Male breast cancer, Stage IV, ER+  HISTORY OF PRESENT ILLNESS: Johnny Lucas is a 78 y.o. male who was diagnosed with IgG kappa multiple myeloma in February 2018 while being evaluated for the status of his breast cancer.  He was diagnosed in 2011 with stage IV infiltrating ductal carcinoma of the left breast, ER+, HER2 negative when he presented with a pathologic fracture of the right humerus.  This was pinned and treated with radiation (30 Gy in 10 fractions from 02/09 - 02/18/2010) and he was started on tamoxifen  which he has been on since.  Of note, a bone scan at the time of diagnosis of the breast cancer showed multiple lesions, which were confirmed to be breast cancer.    June 2017 - Imaging studies performed as follow up of his breast cancer showed three hypermetabolic osseous lesions (sternum, left ilium, and right ilium). A subsequent PET scan on 10/13/2016 showed various new and enlarging osseous metastatic lesions throughout the axial and proximal appendicular skeleton, including in the upper left thyroid  cartilage.  A biopsy of the left ilium demonstrated plasma cell neoplasm. This prompted evaluation for myeloma as a second malignancy.  01/18/2017 - Laboratory evaluation revealed an M spike of 2.3, IgG kappa, IgG 3234, IgA 97, IgM 25, and SFLC ratio 272.96 (kappa 2129.1, lambda 7.8).  02/12/2017 - bone marrow biopsy showed variably cellular marrow with large peritrabecular aggregates of kappa restricted plasma cells (30% by immunohistochemistry).  FISH was positive for +11.    He was diagnosed with myeloma and was started on chemotherapy.  TREATMENT HISTORY: 03/01/2017: Started induction therapy with RVd using 21-day cycles: lenalidomide  25 mg (D1-14), bortezomib  1.3 mg/m2 (D1/8/15), and dexamethasone  40 mg (D1/8/15) of each 21-day cycle. Started denosumab  once monthly. He also received radiation therapy.  06/02/2017: He was referred to the Myeloma Clinic at [facility] and seen to discuss autologous stem cell transplant. At the time of evaluation, his M-spike was 0.6, serum free light chain ratio of 117.69 (kappa of 666.12 and lambda of 5.66) consistent with a partial response. 07/2017: Treatment was changed to KCD (carfilzomib , cyclophosphamide , dexamethasone ) in an attempt to obtain a deeper response prior to transplant. A bone marrow done after completing first cycle in 08/17/17 showed an overall 5% clonal plasma cells and a drop in M-spike to 0.29.  His treatment was held during week two of cycle two due to thrombocytopenia.  Treatment was resumed on 08/23/2017. Pre-transplant work-up was set up for the end of cycle 2.  PRE-TRANSPLANT EVALUATION:  ASBMT classification: Low risk, HCT-CI score: 6 2D Echo: 55%, PFT's: DLCO 52.8%, FEV1: 94.7% SPEP: 0.24, IFIX: IgG kappa, kappa 43.42, lambda 2.43, ratio 17.87. Bone marrow biopsy: Hypocellular marrow (20%) with 5% overall but focally 20% plasma cells. Normal cytogenetics and FISH PET/CT scan: No significant FDG avid osseous disease identified. Mild nonspecific subareolar breast uptake bilaterally. Disease status at time of transplant: VGPR  AUTOLOGOUS STEM CELL TRANSPLANT: Patient mobilized using GCS-F and Plerixafor .  He collected stem cells on 09/28/17 and 09/29/17 for a total of 1.13x10(7) CD34+ cells/kg. Preparative regimen with Melphalan  200 mg/m2 on 10/07/17 (outpatient). Infusion of stem cells - 5.65 x 10(6)  on 10/08/17 (outpatient) Admitted 10/16/2017 with neutropenic fever and started on broad spectrum Zosyn .  Blood cultures, GI pathogen panel, C. difficile EIA, RVP remained negative throughout his hospitalization.  After 72 hours of persistent fevers, added azithromycin to cover atypical pulmonary infectious organisms due to persistent cough. CT scan chest did not show any pneumonia or fungal infection. On 10/24 fever abated and WBCs engrafted. Transitioned from Zosyn  to ciprofloxacin to provide coverage of both GI and atypical pulmonary organisms -completed a 7-day course 10/22/2017. Peri-engraftment syndrome: Symptoms of diarrhea and evidence of skin rash noted. Started on prednisone  20 mg  Engraftment: WBC engraftment on 10/20/2017 and platelet engraftment anticipated for 10/29/2017 (platelet count 98,000). Last platelet transfusion given 10/21/2017.  POST-TRANSPLANT COURSE: Day 100 evaluation showed persistent VGPR: bone marrow biopsy showed 2% plasma cells, M-spike was 0.23, and serum free light chain ratio of 1.87 (kappa of 16.13 and lambda of 8.61).  01/2018 - Consolidation post-transplant with CyCarD to achieve a deeper response.  Consolidation therapy CyCarD on Days 1,2, 8,9, 15,16 of each 28 days Cycle. Carfilzomib  20 mg/m2 given on 2/18 and 02/15/2018. Increased to 36 mg/m2 on 02/21/2018. M-Spike undetectable after 2 cycles. 04/20/2018 - Maintenance therapy: Carfilzomib  70 mg/m2 on Days 1 and 15 of each 28 day cycle 01/05/2019 - Maintenance carfilzomib  reduced to 35mg /m2 with dexamethasone  premed. 01/09/2020 - Carfilzomib  increased to 56 mg/m2 with dexamethasone  premed due to increase in myeloma laboratory studies (see table) 02/14/2020 - Bone marrow aspirate and biopsy demonstrated 60% plasma cells 02/12/2020 - [facility] at [facility] showed multiple FDG avid lesions.  With findings consistent with progressive disease therapy was recommended to be switched to daratumumab  weekly, bortezomib  (1.3mg /M2) days 1, 4, 8, 11, and dexamethasone  - He received a total of 4 cycles of therapy given as a 21 day  cycle. 02/28/2020 C1D1  03/18/2020 C2D1  04/08/2020 C3D1 04/29/2020 C4D1 05/14/2020 - Laboratory studies showed an ongoing rise in free kappa light chain on local labs (see table).  It was recommended to change regimen to daratumumab  on days 1 and 15, pomalidomide  (3 mg on days 1-21), and dexamethasone  (20 mg on days 1, 8,15, 22).   05/28/20 C1D1 DPd (dara every other week, Pomalidomide  3 mg, dexamethasone  20 mg weekly) 06/25/2020 - C2D1 07/23/2020 - C3D1 Pomalidomide  reduced to 2 mg due to severe weakness 08/20/2020 - C4D1, noted to have hyperCa with Ca 12.5, improve with IVF and d/c'ing vitamins 09/17/2020 - C5D1 10/15/2020 - C6D1 10/29/2020  - C7D1 - transitioned to SQ daratumumab  once monthly 11/26/2020 - present: SQ Dara D1, pomalidomide  2 mg D1-21, dexamethasone  20 mg weekly of a 28-day cycle 02/19 - 04/25/24: Developed osteomyelitis of right maxilla and sinuses; underwent surgical debridement on 02/19, treated with micafungin , daptomycin , ertapenem  for ~4 weeks, then transitioned to Augmentin  x 30 days per ID. Myeloma-directed therapy held during this time.  INTERVAL HISTORY Johnny Lucas returns to clinic for continued follow-up of his IgG kappa myeloma, currently on SQ daratumumab , pomalidomide , and dexamethasone  of a 28-day cycle.  He has overall doing fairly well.  Still having issues with oral fistula, says that is continuing to get evaluated locally.  No other new infectious issues.  No CP or SOB.  No bleeding issues.  REVIEW OF SYSTEMS: A full 12 system ROS was obtained and was negative unless specified above.  ECOG: ECOG/Zubrod Performance Status: 2 - Capable of selfcare but unable to carry out any work activities. Up and about > 50 percent of waking hours KPS (60-50%)  PAST MEDICAL HISTORY:  Active Ambulatory Problems    Diagnosis Date Noted  . Multiple myeloma ([name] 07/05/2017  . Pre-transplant evaluation for stem cell transplant 09/06/2017  . H/O autologous stem  cell transplant ([name] 09/06/2017  . Peripheral neuropathy due to chemotherapy ([name] 04/12/2018  . Anxiety 02/09/2020  . Arthritis 09/10/2016  . Malignant neoplasm metastatic to bone    (CMD) 09/10/2016  . Gastroesophageal reflux disease 10/07/2010  . Essential hypertension 11/12/2013  . Macular degeneration 11/12/2013  . Breast cancer, male ([name] 02/09/2020  . Plantar fasciitis of right foot 02/09/2020  . Constipation 10/07/2010  . TIA (transient ischemic attack) 11/12/2013  . Hypomagnesemia 10/01/2020  . Stage 3a chronic kidney disease ([name] 12/17/2020  . Pancytopenia ([name] 01/19/2023  . S/P FESS (functional endoscopic sinus surgery) 04/05/2023  . Chronic pansinusitis 04/05/2023  . Chronic osteomyelitis of maxilla 02/24/2024  . Oroantral fistula 02/24/2024   Resolved Ambulatory Problems    Diagnosis Date Noted  . No Resolved Ambulatory Problems   Past Medical History:  Diagnosis Date  . Bone cancer    (CMD)   . Cancer    (CMD)   . Chicken pox   . Hypertension   . Measles   . Mumps    PAST SURGICAL HISTORY:   Past Surgical History:  Procedure Laterality Date  . CERVICAL SPINE SURGERY     Procedure: CERVICAL SPINE SURGERY  . HERNIA REPAIR     Procedure: HERNIA REPAIR  . HUMERUS SURGERY     Procedure: HUMERUS SURGERY  . OTHER SURGICAL HISTORY     Procedure: OTHER SURGICAL HISTORY (cataracts)  . TONSILLECTOMY     Procedure: TONSILLECTOMY   MEDICATIONS:  Current Outpatient Medications  Medication Instructions  . acetaminophen  (TYLENOL ) 500 mg, As needed  . acyclovir  (ZOVIRAX ) 800 mg, oral, 2 times daily  . albuterol  HFA (Ventolin  HFA) 90 mcg/actuation inhaler 2 puffs, Every 6 hours PRN  . ALPRAZolam  (XANAX ) 0.5 mg, At  bedtime PRN  . aspirin  81 mg EC tablet Take 2 tablets (162 mg total) by mouth daily.  . betamethasone , augmented, (DIPROLENE  AF) 0.05 % cream   . cholecalciferol  (VITAMIN D3) 4,000 Units  . cyanocobalamin  (VITAMIN B12) 1,000 mcg, Daily  . folic  acid (FOLVITE ) 1 mg tablet TAKE ONE TABLET BY MOUTH DAILY  . gabapentin  (NEURONTIN ) 300 mg, 3 times daily  . hydrALAZINE  (APRESOLINE ) 50 mg, 3 times daily  . loratadine  (CLARITIN ) 10 mg, Daily  . metoprolol  succinate (TOPROL  XL) 100 mg, Daily  . pantoprazole  (PROTONIX ) 40 mg, oral, Daily  . pentoxifylline (TRENTAL) 400 mg, oral, 3 times daily with meals  . Pomalyst  2 mg  . predniSONE  (DELTASONE ) 10 mg tablet Take by mouth.  . tamoxifen  (NOLVADEX ) 20 mg tablet Take 1 tablet (20 mg total) by mouth daily.    ALLERGIES:  Cefepime   SOCIAL HISTORY:   Social History   Tobacco Use  . Smoking status: Never  . Smokeless tobacco: Former    Quit date: 12/28/2001  Vaping Use  . Vaping status: Never Used  Substance Use Topics  . Alcohol  use: Yes    Alcohol /week: 6.0 standard drinks of alcohol     Types: 6 Cans of beer per week    Comment: occasionally 6-12 weekly  . Drug use: No   FAMILY HISTORY:  Family History  Problem Relation Name Age of Onset  . Cancer Sister     PHYSICAL EXAMINATION: VITALS: BP 139/70 (BP Location: Left arm, Patient Position: Sitting)   Pulse 53  Temp 97.4 F (36.3 C) (Temporal)   Ht 1.753 m (5' 9)   Wt 87 kg (191 lb 14.4 oz)   SpO2 97%   BMI 28.34 kg/m   GENERAL: Sitting on exam table in no acute distress. SKIN: Skin warm and dry with good turgor. No edema, rash, or lesions noted. HEENT: Normalocephalic, atraumatic. Sclera white, conjunctivae pink. PERRLA (pupils equal, round, reactive to light and near accommodation). EOMs intact. External nose symmetric and without lesions.  Lips without lesions. Oral mucosa pink and moist. Right oral antral fistula, lateral to teeth without obvious exudate. Dentition fair. Pharynx clear and without exudate or erythema. Symmetric rise and fall of soft palate. CARDIOVASCULAR: Chest wall without deformity. No heaves, lifts, or thrills. Heart regular rate and rhythm. No murmurs, gallops, or rubs. Normal S1 and S2. No S3 or  S4. RESPIRATORY: Thorax symmetric with good expansion. Clear to auscultation bilaterally; no rales, rhonchi, or wheezes.  PV: Upper and lower extremities pink and warm to touch, non-tender to palpation. No cyanosis or edema appreciated.  NEURO: Mental Status: Alert, relaxed, and cooperative. Thought process coherent. Oriented to person, place, and time. Detailed cognitive testing deferred. Normal gait. No focal weakness. MSK: Full range of motion in all joints of the bilateral upper and lower extremities. No evidence of swelling or deformity.   LABORATORY DATA: No visits with results within 2 Week(s) from this visit.  Latest known visit with results is:  Nurse Only on 04/20/2024  Component Date Value Ref Range Status  . Immunoelectrophoresis (IFE), Serum 04/20/2024 NO DEFINITE CLONALITY DETECTED.   Final  . Pathology Review Serum Immunoelect* 04/20/2024 Electronically signed by Ozell Darryle Sarah.    Final  . Total Protein 04/20/2024 5.0 (L)  6.4 - 8.9 g/dL Final  . Electrophoresis, Albumin  04/20/2024 3.5  3.5 - 5.0 g/dL Final  . Joeyj-8-Honalopw 04/20/2024 0.3  0.1 - 0.4 g/dL Final  . Joeyj-7-Honalopw 04/20/2024 0.6  0.4 - 1.2 g/dL Final  . Azuj-8-Honalopw 04/20/2024 0.4  0.4 - 0.7 g/dL Final  . Beta-2 -Globulin 04/20/2024 0.2  0.1 - 0.4 g/dL Final  . Gamma Globulin 04/20/2024 0.2 (L)  0.5 - 1.6 g/dL Final  . Protein Electrophoresis Interpreta* 04/20/2024 NO M SPIKE SEEN.   Final  . Pathology Review Serum Electrophor* 04/20/2024 Electronically signed by [employer]    Final  . Immunoglobulin A, Quantitative (Ig* 04/20/2024 <10 (L)  66 - 433 mg/dL Final  . Immunoglobulin G, Quantitative (Ig* 04/20/2024 140 (L)  635 - 1,741 mg/dL Final  . Immunoglobulin M, Quantitative (Ig* 04/20/2024 <20 (L)  45 - 281 mg/dL Final  . Free Kappa Light Chains, Serum 04/20/2024 0.95 (L)  3.30 - 19.40 mg/L Final  . Free Lambda Light Chains, Serum 04/20/2024 <1.47 (L)  5.71 - 26.30 mg/L Final  .  Kappa/Lambda Ratio, Serum 04/20/2024    Final   Ratio not calculated due to Kappa and/or Lambda value less than the lower limit value.  . Sodium 04/20/2024 139  136 - 145 mmol/L Final  . Potassium 04/20/2024 4.4  3.4 - 4.5 mmol/L Final   NO VISIBLE HEMOLYSIS  . Chloride 04/20/2024 106  98 - 107 mmol/L Final  . CO2 04/20/2024 29  21 - 31 mmol/L Final  . Anion Gap 04/20/2024 4 (L)  6 - 14 mmol/L Final  . Glucose, Random 04/20/2024 97  70 - 99 mg/dL Final  . Blood Urea Nitrogen (BUN) 04/20/2024 20  7 - 25 mg/dL Final  . Creatinine 95/75/7974 1.31 (H)  0.70 - 1.30 mg/dL Final  . eGFR 95/75/7974 56 (L)  >59 mL/min/1.69m2 Final   GFR estimated by CKD-EPI equations(NKF 2021).   Recommend confirmation of Cr-based eGFR by using Cys-based eGFR and other filtration markers (if applicable) in complex cases and clinical decision-making, as needed.  . Albumin  04/20/2024 3.9  3.5 - 5.7 g/dL Final  . Total Protein 04/20/2024 5.3 (L)  6.4 - 8.9 g/dL Final  . Bilirubin, Total 04/20/2024 0.5  0.3 - 1.0 mg/dL Final  . Alkaline Phosphatase (ALP) 04/20/2024 34  34 - 104 U/L Final  . Aspartate Aminotransferase (AST) 04/20/2024 14  13 - 39 U/L Final  . Alanine Aminotransferase (ALT) 04/20/2024 8  7 - 52 U/L Final  . Calcium  04/20/2024 8.6  8.6 - 10.3 mg/dL Final  . BUN/Creatinine Ratio 04/20/2024 15.3  10.0 - 20.0 Final  . IFE, SERUM 04/20/2024 Comment (A)   Final   Immunofixation shows IgG monoclonal protein with kappa light chain specificity. PLEASE NOTE:      This sample has been treated to eliminate IgG Kappa interference      from monoclonal therapy by DARZALEX (R) (daratumumab ). Any      remaining IgG Kappa, if present, is due to the patient's own      immune response.  . Immunoglobulin G, Quantitative 04/20/2024 165 (L)  603 - 1613 mg/dL Final   Result confirmed on concentration.  . Immunoglobulin A, Quantitative (Ig* 04/20/2024 6 (L)  61 - 437 mg/dL Final   Result confirmed on concentration.  .  Immunoglobulin M, Quantitative (Ig* 04/20/2024 <5 (L)  15 - 143 mg/dL Final   Result confirmed on concentration.  . WBC 04/20/2024 6.60  4.40 - 11.00 10*3/uL Final  . RBC 04/20/2024 3.32 (L)  4.50 - 5.90 10*6/uL Final  . Hemoglobin 04/20/2024 11.3 (L)  14.0 - 17.5 g/dL Final  . Hematocrit 95/75/7974 34.0 (L)  41.5 - 50.4 % Final  . Mean Corpuscular Volume (MCV) 04/20/2024 102.2 (H)  80.0 - 96.0 fL Final  . Mean Corpuscular Hemoglobin (MCH) 04/20/2024 34.1 (H)  27.5 - 33.2 pg Final  . Mean Corpuscular Hemoglobin Conc (* 04/20/2024 33.4  33.0 - 37.0 g/dL Final  . Red Cell Distribution Width (RDW) 04/20/2024 16.2  12.3 - 17.0 % Final  . Platelet Count (PLT) 04/20/2024 176  150 - 450 10*3/uL Final  . Mean Platelet Volume (MPV) 04/20/2024 7.6  6.8 - 10.2 fL Final  . Neutrophils % 04/20/2024 67  % Final  . Lymphocytes % 04/20/2024 20  % Final  . Monocytes % 04/20/2024 9  % Final  . Eosinophils % 04/20/2024 4  % Final  . Basophils % 04/20/2024 1  % Final  . nRBC % 04/20/2024 0  % Final  . Neutrophils Absolute 04/20/2024 4.40  1.80 - 7.80 10*3/uL Final  . Lymphocytes # 04/20/2024 1.30  1.00 - 4.80 10*3/uL Final  . Monocytes # 04/20/2024 0.60  0.00 - 0.80 10*3/uL Final  . Eosinophils # 04/20/2024 0.30  0.00 - 0.50 10*3/uL Final  . Basophils # 04/20/2024 0.10  0.00 - 0.20 10*3/uL Final  . nRBC Absolute 04/20/2024 0.00  <=0.00 10*3/uL Final   RADIOGRAPHIC DATA: PET Scan (12/31/2016, [place]):  Multifocal hypermetabolic osseous metastases throughout the axial and proximal appendicular skeleton, which are increased in size, number and metabolism since 10/13/2016 PET-CT. 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 No additional sites of hypermetabolic  metastatic disease. Chronic right mastoid sinusitis. Aortic atherosclerosis.  One vessel coronary atherosclerosis.   PET Scan  (10/13/2016, [place]): Various new and enlarging osseous metastatic lesions as detailed above. No definite extra osseous metastatic disease is currently identified. Chronic right maxillary sinusitis. Coronary atherosclerosis with mild cardiomegaly. Several suspected cysts in the liver, spleen, and left kidney.  Aortoiliac atherosclerotic vascular disease.  PET Scan (06/09/2016, [place]): Three hypermetabolic osseous lesions in the sternum, left ilium and right ilium, as discussed above, likely represent osseous metastases. At this time, these are not recognizable on the CT images. No extra skeletal metastatic disease identified in the neck, chest, abdomen or pelvis. Atherosclerosis, including left anterior descending coronary artery disease. Please note that although the presence of coronary artery calcium  documents the presence of coronary artery disease, the severity of this disease and any potential stenosis cannot be assessed on this non-gated CT examination. Assessment for potential risk factor modification, dietary therapy or pharmacologic therapy may be warranted, if clinically indicated.  PET Scan (09/20/2017): No significant FDG avid osseous disease identified. Mild nonspecific subareolar breast uptake bilaterally.  PET Scan (01/19/18): No FDG avid myeloma or plasmacytoma identified.  PETCT Myeloma (02/12/2020 at [facility] - compared to PETCT in 12/2016): 1. New medial L supraclavicular LN at the throracic inlet, max SUV 14.2. 2. Multifocal osseous lesions largely improved, though residual lesions noted including: R acromion (SUV 14.8), L acromium (SUV 10.2), Thyroid  cartilage L>R (SUV 13), R sternum (SUV 10.6). Additional lesions throughout the sternum, b/l ribs, thoracolumbar spine, and b/l pelvis have resolved.   PATHOLOGY DATA:  Bone marrow biopsy (02/02/2017, [id number], [place]): The marrow is variably cellular with large peritrabecular aggregates of kappa restricted plasma cells  (30% by immunohistochemistry). Cytogenetics positive for +11.   Bone marrow biopsy (08/17/2017, [id number]): Hypocellular marrow at 20% with plasma cell involvement 5% overall but focally at 20%.    Bone marrow biopsy (01/19/18): 20-30% cellularity with 2-3% plasma cells.  L anterior maxillary vestibule (01/25/2020): Atypical plasma cell infiltrate, IHC+ CD138, kappa light chain strongly positive.   Bone Marrow Biopsy (02/14/2020, [id number]): Plasma cell myeloma, kappa light chain restricted. By immunohistochemistry, CD138 highlights paratrabecular sheets of plasma cells, representing 60-70% of cells, focally.  Flow cytometry confirms monoclonal plasma cell population, kappa light chain restricted (see also [id number]). CG normal. FISH myeloma negative.  ASSESSMENT and PLAN:  IgG kappa multiple myeloma:  Previously discussed bone marrow biopsy with MRD to consider de-escalating therapy which Mr. Madole declined. Myeloma serology last performed at [facility] consistent with ongoing VGPR/unconfirmed CR with repeat labs pending today (10/23), including DIRA. He is now on dara monotherapy due to concern for contribution of IMID to his fistula, responding well, would continue on dara maintenance at this  time, continue with HSV ppx Continue  with IVIG every 4-8 weeks to maintain IgG > 400 mg/dL in the setting of ongoing hypogammaglobulinemia and recent serious infection. He will return to clinic in 6 month(s) and was encouraged to contact our clinic in the interim if he develops any questions or concerns.  Left anterior maxillary plasmacytoma - resected: Previously developed left anterior maxillary mass in the setting of progressive myeloma. Pathology consistent with plasmacytoma resected on 01/25/20 by Dr. Krystal Muslim at [facility] [phone]). No evidence of recurrence at this time.  Right maxillary/sinus osteomyelitis: Developed osteomyelitis of right maxilla and sinuses with associated  facial swelling, numbness, and headaches that progressively worsened from 10/2023 to 01/2024. Underwent surgical debridement on 02/19, treated with micafungin ,  daptomycin , ertapenem  for ~4 weeks, then transitioned to Augmentin  x 30 days per ID. Antibiotics now complete. Being follows locally  Macrocytic anemia:  Hgb stable in 10-11 g/dL range. Continue to monitor.  Chronic kidney disease, stage III: Creatinine consistent with his recent baseline (1.2 - 1.7 mg/dL). Continue to monitor.  Peripheral neuropathy: He has chronic neuropathy (numbness/tingling) in his BLEs and hands with a film-like sensation on the soles of his feet and moderate, though not complete, numbness. Remains on gabapentin  300 mg TID. Continue to monitor.  History of ER/PR+, HER2- male breast cancer:  Diagnosed on 12/31/2009. Remains on tamoxifen  20 mg QD and continues to follow with Dr. Katragadda for this concern.   Supportive care: Bisphosphonate therapy: He can discontinue denosumab  if he has completed 2 years of bisphosphonate therapy. Continue daily calcium  and Vitamin D  supplementation. IVIG: Consider IVIG in the setting of persistent hypogammaglobulinemia as above. Prophylaxis: He will remain on acyclovir  400 mg BID for suppression of HSV/VZV while on daratumumab .  FOLLOW-UP: RTC 6 months - labs, visit

## 2024-11-01 ENCOUNTER — Other Ambulatory Visit: Payer: Self-pay | Admitting: *Deleted

## 2024-11-01 MED ORDER — DEXAMETHASONE 4 MG PO TABS: 20.0000 mg | ORAL_TABLET | ORAL | 6 refills | Status: AC

## 2024-11-08 DIAGNOSIS — Z9889 Other specified postprocedural states: Secondary | ICD-10-CM | POA: Diagnosis not present

## 2024-11-08 DIAGNOSIS — J320 Chronic maxillary sinusitis: Secondary | ICD-10-CM | POA: Diagnosis not present

## 2024-11-08 DIAGNOSIS — M8710 Osteonecrosis due to drugs, unspecified bone: Secondary | ICD-10-CM | POA: Diagnosis not present

## 2024-11-08 DIAGNOSIS — C9000 Multiple myeloma not having achieved remission: Secondary | ICD-10-CM | POA: Diagnosis not present

## 2024-11-09 ENCOUNTER — Encounter: Payer: Self-pay | Admitting: Oncology

## 2024-11-10 ENCOUNTER — Inpatient Hospital Stay: Attending: Hematology

## 2024-11-10 ENCOUNTER — Inpatient Hospital Stay

## 2024-11-10 VITALS — BP 144/72 | HR 53 | Temp 98.2°F | Resp 17

## 2024-11-10 DIAGNOSIS — C9000 Multiple myeloma not having achieved remission: Secondary | ICD-10-CM | POA: Insufficient documentation

## 2024-11-10 DIAGNOSIS — C50122 Malignant neoplasm of central portion of left male breast: Secondary | ICD-10-CM | POA: Diagnosis not present

## 2024-11-10 DIAGNOSIS — Z79899 Other long term (current) drug therapy: Secondary | ICD-10-CM | POA: Insufficient documentation

## 2024-11-10 DIAGNOSIS — Z5112 Encounter for antineoplastic immunotherapy: Secondary | ICD-10-CM | POA: Insufficient documentation

## 2024-11-10 DIAGNOSIS — C7951 Secondary malignant neoplasm of bone: Secondary | ICD-10-CM

## 2024-11-10 DIAGNOSIS — D702 Other drug-induced agranulocytosis: Secondary | ICD-10-CM

## 2024-11-10 DIAGNOSIS — D508 Other iron deficiency anemias: Secondary | ICD-10-CM

## 2024-11-10 LAB — COMPREHENSIVE METABOLIC PANEL WITH GFR
ALT: 18 U/L (ref 0–44)
AST: 23 U/L (ref 15–41)
Albumin: 3.7 g/dL (ref 3.5–5.0)
Alkaline Phosphatase: 44 U/L (ref 38–126)
Anion gap: 9 (ref 5–15)
BUN: 15 mg/dL (ref 8–23)
CO2: 26 mmol/L (ref 22–32)
Calcium: 8.5 mg/dL — ABNORMAL LOW (ref 8.9–10.3)
Chloride: 103 mmol/L (ref 98–111)
Creatinine, Ser: 1.44 mg/dL — ABNORMAL HIGH (ref 0.61–1.24)
GFR, Estimated: 50 mL/min — ABNORMAL LOW (ref >60)
Glucose, Bld: 93 mg/dL (ref 70–99)
Potassium: 4.5 mmol/L (ref 3.5–5.1)
Sodium: 138 mmol/L (ref 135–145)
Total Bilirubin: 0.2 mg/dL (ref 0.0–1.2)
Total Protein: 5.6 g/dL — ABNORMAL LOW (ref 6.5–8.1)

## 2024-11-10 LAB — CBC WITH DIFFERENTIAL/PLATELET
Abs Immature Granulocytes: 0.01 K/uL (ref 0.00–0.07)
Basophils Absolute: 0 K/uL (ref 0.0–0.1)
Basophils Relative: 1 %
Eosinophils Absolute: 0.3 K/uL (ref 0.0–0.5)
Eosinophils Relative: 5 %
HCT: 33.6 % — ABNORMAL LOW (ref 39.0–52.0)
Hemoglobin: 11 g/dL — ABNORMAL LOW (ref 13.0–17.0)
Immature Granulocytes: 0 %
Lymphocytes Relative: 18 %
Lymphs Abs: 1.2 K/uL (ref 0.7–4.0)
MCH: 34.7 pg — ABNORMAL HIGH (ref 26.0–34.0)
MCHC: 32.7 g/dL (ref 30.0–36.0)
MCV: 106 fL — ABNORMAL HIGH (ref 80.0–100.0)
Monocytes Absolute: 0.6 K/uL (ref 0.1–1.0)
Monocytes Relative: 10 %
Neutro Abs: 4.4 K/uL (ref 1.7–7.7)
Neutrophils Relative %: 66 %
Platelets: 192 K/uL (ref 150–400)
RBC: 3.17 MIL/uL — ABNORMAL LOW (ref 4.22–5.81)
RDW: 13.2 % (ref 11.5–15.5)
WBC: 6.6 K/uL (ref 4.0–10.5)
nRBC: 0 % (ref 0.0–0.2)

## 2024-11-10 LAB — MAGNESIUM: Magnesium: 2.3 mg/dL (ref 1.7–2.4)

## 2024-11-10 MED ORDER — FAMOTIDINE IN NACL 20-0.9 MG/50ML-% IV SOLN
20.0000 mg | Freq: Once | INTRAVENOUS | Status: AC
  Administered 2024-11-10: 20 mg via INTRAVENOUS
  Filled 2024-11-10: qty 50

## 2024-11-10 MED ORDER — DARATUMUMAB-HYALURONIDASE-FIHJ 1800-30000 MG-UT/15ML ~~LOC~~ SOLN
1800.0000 mg | Freq: Once | SUBCUTANEOUS | Status: AC
  Administered 2024-11-10: 1800 mg via SUBCUTANEOUS
  Filled 2024-11-10: qty 15

## 2024-11-10 MED ORDER — CETIRIZINE HCL 10 MG/ML IV SOLN
10.0000 mg | Freq: Once | INTRAVENOUS | Status: AC
  Administered 2024-11-10: 10 mg via INTRAVENOUS
  Filled 2024-11-10: qty 1

## 2024-11-10 MED ORDER — ACETAMINOPHEN 325 MG PO TABS
650.0000 mg | ORAL_TABLET | Freq: Once | ORAL | Status: AC
  Administered 2024-11-10: 650 mg via ORAL
  Filled 2024-11-10: qty 2

## 2024-11-10 MED ORDER — DEXTROSE 5 % IV SOLN: INTRAVENOUS | Status: DC

## 2024-11-10 MED ORDER — IMMUNE GLOBULIN (HUMAN) 10 GM/100ML IV SOLN
40.0000 g | Freq: Once | INTRAVENOUS | Status: AC
  Administered 2024-11-10: 40 g via INTRAVENOUS
  Filled 2024-11-10: qty 400

## 2024-11-10 NOTE — Progress Notes (Signed)
 Patient presents today for Darzalex  Faspro and IVIG infusion. Vital signs and lab work are within parameters for treatment.   Darzalex  Fasrpo and IVIG given today per MD orders. Tolerated infusion without adverse affects. Vital signs stable. No complaints at this time. Discharged from clinic ambulatory in stable condition. Alert and oriented x 3. F/U with [facility] as scheduled.

## 2024-11-10 NOTE — Patient Instructions (Addendum)
 [facility] [place] - A DEPT OF [facility]  Discharge Instructions: Thank you for choosing [facility] to provide your oncology and hematology care.  If you have a lab appointment with the Cancer Center - please note that after April 8th, 2024, all labs will be drawn in the cancer center.  You do not have to check in or register with the main entrance as you have in the past but will complete your check-in in the cancer center.  Wear comfortable clothing and clothing appropriate for easy access to any Portacath or PICC line.   We strive to give you quality time with your provider. You may need to reschedule your appointment if you arrive late (15 or more minutes).  Arriving late affects you and other patients whose appointments are after yours.  Also, if you miss three or more appointments without notifying the office, you may be dismissed from the clinic at the provider's discretion.      For prescription refill requests, have your pharmacy contact our office and allow 72 hours for refills to be completed.    Today you received the following chemotherapy and/or immunotherapy agents Darzalex  Faspro. Immune Globulin  Injection What is this medication? IMMUNE GLOBULIN  (im MUNE GLOB yoo lin) treats many immune system conditions. It works by designer, multimedia extra antibodies. Antibodies are proteins made by the immune system that help protect the body. This medicine may be used for other purposes; ask your health care provider or pharmacist if you have questions. COMMON BRAND NAME(S): ASCENIV, Baygam, BIVIGAM, Carimune, Carimune NF, cutaquig, Cuvitru, Flebogamma, Flebogamma DIF, GamaSTAN, GamaSTAN S/D, Gamimune N, Gammagard, Gammagard S/D, Gammaked, Gammaplex, Gammar-P IV, Gamunex, Gamunex-C, Hizentra, Iveegam, Iveegam EN, Octagam, Panglobulin, Panglobulin NF, panzyga, Polygam S/D, Privigen , Sandoglobulin, Venoglobulin-S, Vigam, Vivaglobulin, Xembify What should I  tell my care team before I take this medication? They need to know if you have any of these conditions: Blood clotting disorder Condition where you have excess fluid in your body, such as heart failure or edema Dehydration Diabetes Have had blood clots Heart disease Immune system conditions Kidney disease Low levels of IgA Recent or upcoming vaccine An unusual or allergic reaction to immune globulin , other medications, foods, dyes, or preservatives Pregnant or trying to get pregnant Breastfeeding How should I use this medication? This medication is infused into a vein or under the skin. It may also be injected into a muscle. It is usually given by your care team in a hospital or clinic setting. It may also be given at home. If you get this medication at home, you will be taught how to prepare and give it. Take it as directed on the prescription label. Keep taking it unless your care team tells you to stop. It is important that you put your used needles and syringes in a special sharps container. Do not put them in a trash can. If you do not have a sharps container, call your pharmacist or care team to get one. Talk to your care team about the use of this medication in children. While it may be given to children for selected conditions, precautions do apply. Overdosage: If you think you have taken too much of this medicine contact a poison control center or emergency room at once. NOTE: This medicine is only for you. Do not share this medicine with others. What if I miss a dose? If you get this medication at the hospital or clinic: It is important not  to miss your dose. Call your care team if you are unable to keep an appointment. If you give yourself this medication at home: If you miss a dose, take it as soon as you can. Then continue your normal schedule. If it is almost time for your next dose, take only that dose. Do not take double or extra doses. Call your care team with  questions. What may interact with this medication? Live virus vaccines This list may not describe all possible interactions. Give your health care provider a list of all the medicines, herbs, non-prescription drugs, or dietary supplements you use. Also tell them if you smoke, drink alcohol , or use illegal drugs. Some items may interact with your medicine. What should I watch for while using this medication? Your condition will be monitored carefully while you are receiving this medication. Tell your care team if your symptoms do not start to get better or if they get worse. You may need blood work done while you are taking this medication. This medication increases the risk of blood clots. People with heart, blood vessel, or blood clotting conditions are more likely to develop a blood clot. Other risk factors include advanced age, estrogen use, tobacco use, lack of movement, and being overweight. This medication can decrease the response to a vaccine. If you need to get vaccinated, tell your care team if you have received this medication within the last year. Extra booster doses may be needed. Talk to your care team to see if a different vaccination schedule is needed. This medication is made from donated human blood. There is a small risk it may contain bacteria or viruses, such as hepatitis or HIV. All products are processed to kill most bacteria and viruses. Talk to your care team if you have questions about the risk of infection. If you have diabetes, talk to your care team about which device you should use to check your blood sugar. This medication may cause some devices to report falsely high blood sugar levels. This may cause you to react by not treating a low blood sugar level or by giving an insulin dose that was not needed. This can cause severe low blood sugar levels. What side effects may I notice from receiving this medication? Side effects that you should report to your care team as soon as  possible: Allergic reactions--skin rash, itching, hives, swelling of the face, lips, tongue, or throat Blood clot--pain, swelling, or warmth in the leg, shortness of breath, chest pain Fever, neck pain or stiffness, sensitivity to light, headache, nausea, vomiting, confusion, which may be signs of meningitis Hemolytic anemia--unusual weakness or fatigue, dizziness, headache, trouble breathing, dark urine, yellowing skin or eyes Kidney injury--decrease in the amount of urine, swelling of the ankles, hands, or feet Low sodium level--muscle weakness, fatigue, dizziness, headache, confusion Shortness of breath or trouble breathing, cough, unusual weakness or fatigue, blue skin or lips Side effects that usually do not require medical attention (report these to your care team if they continue or are bothersome): Chills Diarrhea Fever Headache Nausea This list may not describe all possible side effects. Call your doctor for medical advice about side effects. You may report side effects to FDA at 1-800-FDA-1088. Where should I keep my medication? Keep out of the reach of children and pets. You will be instructed on how to store this medication. Get rid of any unused medication after the expiration date. To get rid of medications that are no longer needed or have expired: Take  the medication to a medication take-back program. Check with your pharmacy or law enforcement to find a location. If you cannot return the medication, ask your pharmacist or care team how to get rid of this medication safely. NOTE: This sheet is a summary. It may not cover all possible information. If you have questions about this medicine, talk to your doctor, pharmacist, or health care provider.  2025 Elsevier/Gold Standard (2024-02-28 00:00:00)Daratumumab ; Hyaluronidase  Injection What is this medication? DARATUMUMAB ; HYALURONIDASE  (dar a toom ue mab; hye al ur ON i dase) treats multiple myeloma, a type of bone marrow cancer.  Daratumumab  works by blocking a protein that causes cancer cells to grow and multiply. This helps to slow or stop the spread of cancer cells. Hyaluronidase  works by increasing the absorption of other medications in the body to help them work better. This medication may also be used treat amyloidosis, a condition that causes the buildup of a protein (amyloid) in your body. It works by reducing the buildup of this protein, which decreases symptoms. It is a combination medication that contains a monoclonal antibody. This medicine may be used for other purposes; ask your health care provider or pharmacist if you have questions. COMMON BRAND NAME(S): DARZALEX  FASPRO What should I tell my care team before I take this medication? They need to know if you have any of these conditions: Heart disease Infection, such as chickenpox, cold sores, herpes, hepatitis B Lung or breathing disease An unusual or allergic reaction to daratumumab , hyaluronidase , other medications, foods, dyes, or preservatives Pregnant or trying to get pregnant Breast-feeding How should I use this medication? This medication is injected under the skin. It is given by your care team in a hospital or clinic setting. Talk to your care team about the use of this medication in children. Special care may be needed. Overdosage: If you think you have taken too much of this medicine contact a poison control center or emergency room at once. NOTE: This medicine is only for you. Do not share this medicine with others. What if I miss a dose? Keep appointments for follow-up doses. It is important not to miss your dose. Call your care team if you are unable to keep an appointment. What may interact with this medication? Interactions have not been studied. This list may not describe all possible interactions. Give your health care provider a list of all the medicines, herbs, non-prescription drugs, or dietary supplements you use. Also tell them if  you smoke, drink alcohol , or use illegal drugs. Some items may interact with your medicine. What should I watch for while using this medication? Your condition will be monitored carefully while you are receiving this medication. This medication can cause serious allergic reactions. To reduce your risk, your care team may give you other medication to take before receiving this one. Be sure to follow the directions from your care team. This medication can affect the results of blood tests to match your blood type. These changes can last for up to 6 months after the final dose. Your care team will do blood tests to match your blood type before you start treatment. Tell all of your care team that you are being treated with this medication before receiving a blood transfusion. This medication can affect the results of some tests used to determine treatment response; extra tests may be needed to evaluate response. Talk to your care team if you wish to become pregnant or think you are pregnant. This medication can cause serious  birth defects if taken during pregnancy and for 3 months after the last dose. A reliable form of contraception is recommended while taking this medication and for 3 months after the last dose. Talk to your care team about effective forms of contraception. Do not breast-feed while taking this medication. What side effects may I notice from receiving this medication? Side effects that you should report to your care team as soon as possible: Allergic reactions--skin rash, itching, hives, swelling of the face, lips, tongue, or throat Heart rhythm changes--fast or irregular heartbeat, dizziness, feeling faint or lightheaded, chest pain, trouble breathing Infection--fever, chills, cough, sore throat, wounds that don't heal, pain or trouble when passing urine, general feeling of discomfort or being unwell Infusion reactions--chest pain, shortness of breath or trouble breathing, feeling faint  or lightheaded Sudden eye pain or change in vision such as blurry vision, seeing halos around lights, vision loss Unusual bruising or bleeding Side effects that usually do not require medical attention (report to your care team if they continue or are bothersome): Constipation Diarrhea Fatigue Nausea Pain, tingling, or numbness in the hands or feet Swelling of the ankles, hands, or feet This list may not describe all possible side effects. Call your doctor for medical advice about side effects. You may report side effects to FDA at 1-800-FDA-1088. Where should I keep my medication? This medication is given in a hospital or clinic. It will not be stored at home. NOTE: This sheet is a summary. It may not cover all possible information. If you have questions about this medicine, talk to your doctor, pharmacist, or health care provider.  2024 Elsevier/Gold Standard (2022-04-21 00:00:00)      To help prevent nausea and vomiting after your treatment, we encourage you to take your nausea medication as directed.  BELOW ARE SYMPTOMS THAT SHOULD BE REPORTED IMMEDIATELY: *FEVER GREATER THAN 100.4 F (38 C) OR HIGHER *CHILLS OR SWEATING *NAUSEA AND VOMITING THAT IS NOT CONTROLLED WITH YOUR NAUSEA MEDICATION *UNUSUAL SHORTNESS OF BREATH *UNUSUAL BRUISING OR BLEEDING *URINARY PROBLEMS (pain or burning when urinating, or frequent urination) *BOWEL PROBLEMS (unusual diarrhea, constipation, pain near the anus) TENDERNESS IN MOUTH AND THROAT WITH OR WITHOUT PRESENCE OF ULCERS (sore throat, sores in mouth, or a toothache) UNUSUAL RASH, SWELLING OR PAIN  UNUSUAL VAGINAL DISCHARGE OR ITCHING   Items with * indicate a potential emergency and should be followed up as soon as possible or go to the Emergency Department if any problems should occur.  Please show the CHEMOTHERAPY ALERT CARD or IMMUNOTHERAPY ALERT CARD at check-in to the Emergency Department and triage nurse.  Should you have questions after  your visit or need to cancel or reschedule your appointment, please contact [facility] [place] - A DEPT OF [facility] [phone]  and follow the prompts.  Office hours are 8:00 a.m. to 4:30 p.m. Monday - Friday. Please note that voicemails left after 4:00 p.m. may not be returned until the following business day.  We are closed weekends and major holidays. You have access to a nurse at all times for urgent questions. Please call the main number to the clinic [phone] and follow the prompts.  For any non-urgent questions, you may also contact your provider using MyChart. We now offer e-Visits for anyone 31 and older to request care online for non-urgent symptoms. For details visit [email or web address].   Also download the MyChart app! Go to the app store, search MyChart, open the app, select [place], and  log in with your MyChart username and password.

## 2024-11-10 NOTE — Patient Instructions (Signed)
 [facility] [place] - A DEPT [facility]  Discharge Instructions: Thank you for choosing [facility] to provide your oncology and hematology care.  If you have a lab appointment with the Cancer Center - please note that after April 8th, 2024, all labs will be drawn in the cancer center.  You do not have to check in or register with the main entrance as you have in the past but will complete your check-in in the cancer center.  Wear comfortable clothing and clothing appropriate for easy access to any Portacath or PICC line.   We strive to give you quality time with your provider. You may need to reschedule your appointment if you arrive late (15 or more minutes).  Arriving late affects you and other patients whose appointments are after yours.  Also, if you miss three or more appointments without notifying the office, you may be dismissed from the clinic at the provider's discretion.      For prescription refill requests, have your pharmacy contact our office and allow 72 hours for refills to be completed.    Today you received the following chemotherapy and/or immunotherapy agents    To help prevent nausea and vomiting after your treatment, we encourage you to take your nausea medication as directed.  BELOW ARE SYMPTOMS THAT SHOULD BE REPORTED IMMEDIATELY: *FEVER GREATER THAN 100.4 F (38 C) OR HIGHER *CHILLS OR SWEATING *NAUSEA AND VOMITING THAT IS NOT CONTROLLED WITH YOUR NAUSEA MEDICATION *UNUSUAL SHORTNESS OF BREATH *UNUSUAL BRUISING OR BLEEDING *URINARY PROBLEMS (pain or burning when urinating, or frequent urination) *BOWEL PROBLEMS (unusual diarrhea, constipation, pain near the anus) TENDERNESS IN MOUTH AND THROAT WITH OR WITHOUT PRESENCE OF ULCERS (sore throat, sores in mouth, or a toothache) UNUSUAL RASH, SWELLING OR PAIN  UNUSUAL VAGINAL DISCHARGE OR ITCHING   Items with * indicate a potential emergency and should be followed up as soon as  possible or go to the Emergency Department if any problems should occur.  Please show the CHEMOTHERAPY ALERT CARD or IMMUNOTHERAPY ALERT CARD at check-in to the Emergency Department and triage nurse.  Should you have questions after your visit or need to cancel or reschedule your appointment, please contact [facility] [place] - A DEPT OF [name] [phone]  and follow the prompts.  Office hours are 8:00 a.m. to 4:30 p.m. Monday - Friday. Please note that voicemails left after 4:00 p.m. may not be returned until the following business day.  We are closed weekends and major holidays. You have access to a nurse at all times for urgent questions. Please call the main number to the clinic [phone] and follow the prompts.  For any non-urgent questions, you may also contact your provider using MyChart. We now offer e-Visits for anyone 79 and older to request care online for non-urgent symptoms. For details visit [email or web address].   Also download the MyChart app! Go to the app store, search "MyChart", open the app, select [place], and log in with your MyChart username and password.

## 2024-12-05 ENCOUNTER — Other Ambulatory Visit: Payer: Self-pay | Admitting: Oncology

## 2024-12-05 DIAGNOSIS — C9000 Multiple myeloma not having achieved remission: Secondary | ICD-10-CM

## 2024-12-08 ENCOUNTER — Inpatient Hospital Stay

## 2024-12-08 ENCOUNTER — Inpatient Hospital Stay: Attending: Hematology

## 2024-12-08 DIAGNOSIS — C9000 Multiple myeloma not having achieved remission: Secondary | ICD-10-CM | POA: Diagnosis not present

## 2024-12-08 DIAGNOSIS — Z5112 Encounter for antineoplastic immunotherapy: Secondary | ICD-10-CM | POA: Insufficient documentation

## 2024-12-08 LAB — CBC WITH DIFFERENTIAL/PLATELET
Abs Immature Granulocytes: 0.01 K/uL (ref 0.00–0.07)
Basophils Absolute: 0 K/uL (ref 0.0–0.1)
Basophils Relative: 0 %
Eosinophils Absolute: 0.2 K/uL (ref 0.0–0.5)
Eosinophils Relative: 3 %
HCT: 33.5 % — ABNORMAL LOW (ref 39.0–52.0)
Hemoglobin: 11.1 g/dL — ABNORMAL LOW (ref 13.0–17.0)
Immature Granulocytes: 0 %
Lymphocytes Relative: 11 %
Lymphs Abs: 0.8 K/uL (ref 0.7–4.0)
MCH: 34.6 pg — ABNORMAL HIGH (ref 26.0–34.0)
MCHC: 33.1 g/dL (ref 30.0–36.0)
MCV: 104.4 fL — ABNORMAL HIGH (ref 80.0–100.0)
Monocytes Absolute: 0.3 K/uL (ref 0.1–1.0)
Monocytes Relative: 4 %
Neutro Abs: 5.5 K/uL (ref 1.7–7.7)
Neutrophils Relative %: 82 %
Platelets: 159 K/uL (ref 150–400)
RBC: 3.21 MIL/uL — ABNORMAL LOW (ref 4.22–5.81)
RDW: 13.8 % (ref 11.5–15.5)
WBC: 6.8 K/uL (ref 4.0–10.5)
nRBC: 0 % (ref 0.0–0.2)

## 2024-12-08 LAB — COMPREHENSIVE METABOLIC PANEL WITH GFR
ALT: 12 U/L (ref 0–44)
AST: 21 U/L (ref 15–41)
Albumin: 3.8 g/dL (ref 3.5–5.0)
Alkaline Phosphatase: 41 U/L (ref 38–126)
Anion gap: 11 (ref 5–15)
BUN: 15 mg/dL (ref 8–23)
CO2: 24 mmol/L (ref 22–32)
Calcium: 9.4 mg/dL (ref 8.9–10.3)
Chloride: 104 mmol/L (ref 98–111)
Creatinine, Ser: 1.48 mg/dL — ABNORMAL HIGH (ref 0.61–1.24)
GFR, Estimated: 49 mL/min — ABNORMAL LOW (ref >60)
Glucose, Bld: 95 mg/dL (ref 70–99)
Potassium: 4.4 mmol/L (ref 3.5–5.1)
Sodium: 139 mmol/L (ref 135–145)
Total Bilirubin: 0.3 mg/dL (ref 0.0–1.2)
Total Protein: 6 g/dL — ABNORMAL LOW (ref 6.5–8.1)

## 2024-12-08 LAB — MAGNESIUM: Magnesium: 1.9 mg/dL (ref 1.7–2.4)

## 2024-12-08 MED ORDER — DARATUMUMAB-HYALURONIDASE-FIHJ 1800-30000 MG-UT/15ML ~~LOC~~ SOLN
1800.0000 mg | Freq: Once | SUBCUTANEOUS | Status: AC
  Administered 2024-12-08: 1800 mg via SUBCUTANEOUS
  Filled 2024-12-08: qty 15

## 2024-12-08 NOTE — Progress Notes (Signed)
 Per pt he took his premedications prior to appointment.   Patient tolerated Darzalex  injection with no complaints voiced.  Site clean and dry with no bruising or swelling noted at site.  See MAR for details.  Band aid applied.  Patient stable during and after injection.  Vss with discharge and left in satisfactory condition with no s/s of distress noted.  All follow ups as scheduled.    Johnny Lucas

## 2024-12-08 NOTE — Patient Instructions (Signed)
 [facility] [place] [facility]  Discharge Instructions: Thank you for choosing [facility] to provide your oncology and hematology care.  If you have a lab appointment with the Cancer Center - please note that after April 8th, 2024, all labs will be drawn in the cancer center.  You do not have to check in or register with the main entrance as you have in the past but will complete your check-in in the cancer center.  Wear comfortable clothing and clothing appropriate for easy access to any Portacath or PICC line.   We strive to give you quality time with your provider. You may need to reschedule your appointment if you arrive late (15 or more minutes).  Arriving late affects you and other patients whose appointments are after yours.  Also, if you miss three or more appointments without notifying the office, you may be dismissed from the clinic at the provider's discretion.      For prescription refill requests, have your pharmacy contact our office and allow 72 hours for refills to be completed.    Today you received the following chemotherapy and/or immunotherapy agents Darzalex    To help prevent nausea and vomiting after your treatment, we encourage you to take your nausea medication as directed.  BELOW ARE SYMPTOMS THAT SHOULD BE REPORTED IMMEDIATELY: *FEVER GREATER THAN 100.4 F (38 C) OR HIGHER *CHILLS OR SWEATING *NAUSEA AND VOMITING THAT IS NOT CONTROLLED WITH YOUR NAUSEA MEDICATION *UNUSUAL SHORTNESS OF BREATH *UNUSUAL BRUISING OR BLEEDING *URINARY PROBLEMS (pain or burning when urinating, or frequent urination) *BOWEL PROBLEMS (unusual diarrhea, constipation, pain near the anus) TENDERNESS IN MOUTH AND THROAT WITH OR WITHOUT PRESENCE OF ULCERS (sore throat, sores in mouth, or a toothache) UNUSUAL RASH, SWELLING OR PAIN  UNUSUAL VAGINAL DISCHARGE OR ITCHING   Items with * indicate a potential emergency and should be followed up as  soon as possible or go to the Emergency Department if any problems should occur.  Please show the CHEMOTHERAPY ALERT CARD or IMMUNOTHERAPY ALERT CARD at check-in to the Emergency Department and triage nurse.  Should you have questions after your visit or need to cancel or reschedule your appointment, please contact [facility] CANCER CTR [place] - A DEPT OF [name] [phone]  and follow the prompts.  Office hours are 8:00 a.m. to 4:30 p.m. Monday - Friday. Please note that voicemails left after 4:00 p.m. may not be returned until the following business day.  We are closed weekends and major holidays. You have access to a nurse at all times for urgent questions. Please call the main number to the clinic [phone] and follow the prompts.  For any non-urgent questions, you may also contact your provider using MyChart. We now offer e-Visits for anyone 2 and older to request care online for non-urgent symptoms. For details visit [email or web address].   Also download the MyChart app! Go to the app store, search "MyChart", open the app, select [place], and log in with your MyChart username and password.

## 2024-12-11 LAB — KAPPA/LAMBDA LIGHT CHAINS
Kappa free light chain: 2 mg/L — ABNORMAL LOW (ref 3.3–19.4)
Kappa, lambda light chain ratio: 0.91 (ref 0.26–1.65)
Lambda free light chains: 2.2 mg/L — ABNORMAL LOW (ref 5.7–26.3)

## 2024-12-20 ENCOUNTER — Other Ambulatory Visit: Payer: Self-pay

## 2024-12-25 ENCOUNTER — Encounter: Payer: Self-pay | Admitting: *Deleted

## 2024-12-25 ENCOUNTER — Other Ambulatory Visit: Payer: Self-pay | Admitting: *Deleted

## 2024-12-25 DIAGNOSIS — C9000 Multiple myeloma not having achieved remission: Secondary | ICD-10-CM

## 2024-12-26 ENCOUNTER — Inpatient Hospital Stay

## 2024-12-26 DIAGNOSIS — Z5112 Encounter for antineoplastic immunotherapy: Secondary | ICD-10-CM | POA: Diagnosis not present

## 2024-12-26 DIAGNOSIS — C9000 Multiple myeloma not having achieved remission: Secondary | ICD-10-CM

## 2024-12-26 NOTE — Progress Notes (Signed)
 Port flushed with good blood return noted. No bruising or swelling at site. Bandaid applied and patient discharged in satisfactory condition. VVS stable with no signs or symptoms of distressed noted.

## 2024-12-26 NOTE — Patient Instructions (Signed)
 CH CANCER CTR [place] - A DEPT OF [facility]  Discharge Instructions: Thank you for choosing [facility] to provide your oncology and hematology care.  If you have a lab appointment with the Cancer Center - please note that after April 8th, 2024, all labs will be drawn in the cancer center.  You do not have to check in or register with the main entrance as you have in the past but will complete your check-in in the cancer center.  Wear comfortable clothing and clothing appropriate for easy access to any Portacath or PICC line.   We strive to give you quality time with your provider. You may need to reschedule your appointment if you arrive late (15 or more minutes).  Arriving late affects you and other patients whose appointments are after yours.  Also, if you miss three or more appointments without notifying the office, you may be dismissed from the clinic at the provider's discretion.      For prescription refill requests, have your pharmacy contact our office and allow 72 hours for refills to be completed.    Today you received the following: Port flush with labs   To help prevent nausea and vomiting after your treatment, we encourage you to take your nausea medication as directed.  BELOW ARE SYMPTOMS THAT SHOULD BE REPORTED IMMEDIATELY: *FEVER GREATER THAN 100.4 F (38 C) OR HIGHER *CHILLS OR SWEATING *NAUSEA AND VOMITING THAT IS NOT CONTROLLED WITH YOUR NAUSEA MEDICATION *UNUSUAL SHORTNESS OF BREATH *UNUSUAL BRUISING OR BLEEDING *URINARY PROBLEMS (pain or burning when urinating, or frequent urination) *BOWEL PROBLEMS (unusual diarrhea, constipation, pain near the anus) TENDERNESS IN MOUTH AND THROAT WITH OR WITHOUT PRESENCE OF ULCERS (sore throat, sores in mouth, or a toothache) UNUSUAL RASH, SWELLING OR PAIN  UNUSUAL VAGINAL DISCHARGE OR ITCHING   Items with * indicate a potential emergency and should be followed up as soon as possible or go to  the Emergency Department if any problems should occur.  Please show the CHEMOTHERAPY ALERT CARD or IMMUNOTHERAPY ALERT CARD at check-in to the Emergency Department and triage nurse.  Should you have questions after your visit or need to cancel or reschedule your appointment, please contact [name] CANCER CTR [place] - A DEPT OF [facility] [phone]  and follow the prompts.  Office hours are 8:00 a.m. to 4:30 p.m. Monday - Friday. Please note that voicemails left after 4:00 p.m. may not be returned until the following business day.  We are closed weekends and major holidays. You have access to a nurse at all times for urgent questions. Please call the main number to the clinic [phone] and follow the prompts.  For any non-urgent questions, you may also contact your provider using MyChart. We now offer e-Visits for anyone 2 and older to request care online for non-urgent symptoms. For details visit [email or web address].   Also download the MyChart app! Go to the app store, search "MyChart", open the app, select [place], and log in with your MyChart username and password.

## 2024-12-29 LAB — PROTEIN ELECTROPHORESIS, SERUM
A/G Ratio: 1.2 (ref 0.7–1.7)
Albumin ELP: 3.2 g/dL (ref 2.9–4.4)
Alpha-1-Globulin: 0.3 g/dL (ref 0.0–0.4)
Alpha-2-Globulin: 0.8 g/dL (ref 0.4–1.0)
Beta Globulin: 0.9 g/dL (ref 0.7–1.3)
Gamma Globulin: 0.6 g/dL (ref 0.4–1.8)
Globulin, Total: 2.6 g/dL (ref 2.2–3.9)
Total Protein ELP: 5.8 g/dL — ABNORMAL LOW (ref 6.0–8.5)

## 2025-01-04 ENCOUNTER — Inpatient Hospital Stay

## 2025-01-04 ENCOUNTER — Inpatient Hospital Stay: Admitting: Oncology

## 2025-01-05 ENCOUNTER — Inpatient Hospital Stay

## 2025-01-05 ENCOUNTER — Inpatient Hospital Stay: Attending: Hematology | Admitting: Oncology

## 2025-01-05 ENCOUNTER — Inpatient Hospital Stay: Attending: Hematology

## 2025-01-05 VITALS — Wt 188.6 lb

## 2025-01-05 VITALS — BP 143/72 | HR 55 | Temp 97.1°F | Resp 17

## 2025-01-05 DIAGNOSIS — T451X5A Adverse effect of antineoplastic and immunosuppressive drugs, initial encounter: Secondary | ICD-10-CM

## 2025-01-05 DIAGNOSIS — Z7952 Long term (current) use of systemic steroids: Secondary | ICD-10-CM | POA: Insufficient documentation

## 2025-01-05 DIAGNOSIS — D702 Other drug-induced agranulocytosis: Secondary | ICD-10-CM

## 2025-01-05 DIAGNOSIS — C9000 Multiple myeloma not having achieved remission: Secondary | ICD-10-CM

## 2025-01-05 DIAGNOSIS — D649 Anemia, unspecified: Secondary | ICD-10-CM | POA: Diagnosis not present

## 2025-01-05 DIAGNOSIS — Z79620 Long term (current) use of immunosuppressive biologic: Secondary | ICD-10-CM | POA: Insufficient documentation

## 2025-01-05 DIAGNOSIS — G629 Polyneuropathy, unspecified: Secondary | ICD-10-CM | POA: Diagnosis not present

## 2025-01-05 DIAGNOSIS — Z7961 Long term (current) use of immunomodulator: Secondary | ICD-10-CM | POA: Diagnosis not present

## 2025-01-05 DIAGNOSIS — Z853 Personal history of malignant neoplasm of breast: Secondary | ICD-10-CM | POA: Diagnosis not present

## 2025-01-05 DIAGNOSIS — N179 Acute kidney failure, unspecified: Secondary | ICD-10-CM

## 2025-01-05 DIAGNOSIS — C50122 Malignant neoplasm of central portion of left male breast: Secondary | ICD-10-CM

## 2025-01-05 DIAGNOSIS — Z5112 Encounter for antineoplastic immunotherapy: Secondary | ICD-10-CM | POA: Insufficient documentation

## 2025-01-05 DIAGNOSIS — D539 Nutritional anemia, unspecified: Secondary | ICD-10-CM | POA: Diagnosis not present

## 2025-01-05 DIAGNOSIS — G620 Drug-induced polyneuropathy: Secondary | ICD-10-CM | POA: Diagnosis not present

## 2025-01-05 DIAGNOSIS — J320 Chronic maxillary sinusitis: Secondary | ICD-10-CM

## 2025-01-05 DIAGNOSIS — C9002 Multiple myeloma in relapse: Secondary | ICD-10-CM | POA: Diagnosis not present

## 2025-01-05 DIAGNOSIS — D508 Other iron deficiency anemias: Secondary | ICD-10-CM

## 2025-01-05 DIAGNOSIS — C7951 Secondary malignant neoplasm of bone: Secondary | ICD-10-CM

## 2025-01-05 DIAGNOSIS — D801 Nonfamilial hypogammaglobulinemia: Secondary | ICD-10-CM | POA: Diagnosis not present

## 2025-01-05 LAB — CBC WITH DIFFERENTIAL/PLATELET
Abs Immature Granulocytes: 0.01 K/uL (ref 0.00–0.07)
Basophils Absolute: 0 K/uL (ref 0.0–0.1)
Basophils Relative: 1 %
Eosinophils Absolute: 0.3 K/uL (ref 0.0–0.5)
Eosinophils Relative: 4 %
HCT: 32.2 % — ABNORMAL LOW (ref 39.0–52.0)
Hemoglobin: 10.7 g/dL — ABNORMAL LOW (ref 13.0–17.0)
Immature Granulocytes: 0 %
Lymphocytes Relative: 20 %
Lymphs Abs: 1.2 K/uL (ref 0.7–4.0)
MCH: 34.4 pg — ABNORMAL HIGH (ref 26.0–34.0)
MCHC: 33.2 g/dL (ref 30.0–36.0)
MCV: 103.5 fL — ABNORMAL HIGH (ref 80.0–100.0)
Monocytes Absolute: 0.6 K/uL (ref 0.1–1.0)
Monocytes Relative: 10 %
Neutro Abs: 4 K/uL (ref 1.7–7.7)
Neutrophils Relative %: 65 %
Platelets: 180 K/uL (ref 150–400)
RBC: 3.11 MIL/uL — ABNORMAL LOW (ref 4.22–5.81)
RDW: 13.2 % (ref 11.5–15.5)
WBC: 6.1 K/uL (ref 4.0–10.5)
nRBC: 0 % (ref 0.0–0.2)

## 2025-01-05 LAB — COMPREHENSIVE METABOLIC PANEL WITH GFR
ALT: 138 U/L — ABNORMAL HIGH (ref 0–44)
AST: 73 U/L — ABNORMAL HIGH (ref 15–41)
Albumin: 3.7 g/dL (ref 3.5–5.0)
Alkaline Phosphatase: 49 U/L (ref 38–126)
Anion gap: 13 (ref 5–15)
BUN: 17 mg/dL (ref 8–23)
CO2: 23 mmol/L (ref 22–32)
Calcium: 9.6 mg/dL (ref 8.9–10.3)
Chloride: 103 mmol/L (ref 98–111)
Creatinine, Ser: 1.72 mg/dL — ABNORMAL HIGH (ref 0.61–1.24)
GFR, Estimated: 41 mL/min — ABNORMAL LOW
Glucose, Bld: 87 mg/dL (ref 70–99)
Potassium: 4.3 mmol/L (ref 3.5–5.1)
Sodium: 139 mmol/L (ref 135–145)
Total Bilirubin: 0.5 mg/dL (ref 0.0–1.2)
Total Protein: 5.6 g/dL — ABNORMAL LOW (ref 6.5–8.1)

## 2025-01-05 LAB — MAGNESIUM: Magnesium: 2.1 mg/dL (ref 1.7–2.4)

## 2025-01-05 MED ORDER — ALTEPLASE 2 MG IJ SOLR: 2.0000 mg | Freq: Once | INTRAMUSCULAR | Status: DC | PRN

## 2025-01-05 MED ORDER — ACETAMINOPHEN 325 MG PO TABS
650.0000 mg | ORAL_TABLET | Freq: Once | ORAL | Status: DC
  Filled 2025-01-05: qty 2

## 2025-01-05 MED ORDER — FAMOTIDINE IN NACL 20-0.9 MG/50ML-% IV SOLN
20.0000 mg | Freq: Once | INTRAVENOUS | Status: AC
  Administered 2025-01-05: 20 mg via INTRAVENOUS
  Filled 2025-01-05: qty 50

## 2025-01-05 MED ORDER — CETIRIZINE HCL 10 MG/ML IV SOLN
10.0000 mg | Freq: Once | INTRAVENOUS | Status: AC
  Administered 2025-01-05: 10 mg via INTRAVENOUS
  Filled 2025-01-05: qty 1

## 2025-01-05 MED ORDER — SODIUM CHLORIDE 0.9% FLUSH: 3.0000 mL | Freq: Once | INTRAVENOUS | Status: DC | PRN

## 2025-01-05 MED ORDER — SODIUM CHLORIDE 0.9% FLUSH: 10.0000 mL | Freq: Once | INTRAVENOUS | Status: DC | PRN

## 2025-01-05 MED ORDER — IMMUNE GLOBULIN (HUMAN) 10 GM/100ML IV SOLN
40.0000 g | Freq: Once | INTRAVENOUS | Status: AC
  Administered 2025-01-05: 40 g via INTRAVENOUS
  Filled 2025-01-05: qty 400

## 2025-01-05 MED ORDER — DEXTROSE 5 % IV SOLN: INTRAVENOUS | Status: DC

## 2025-01-05 MED ORDER — HEPARIN SOD (PORK) LOCK FLUSH 100 UNIT/ML IV SOLN: 250.0000 [IU] | Freq: Once | INTRAVENOUS | Status: DC | PRN

## 2025-01-05 MED ORDER — DEXAMETHASONE 4 MG PO TABS
20.0000 mg | ORAL_TABLET | Freq: Once | ORAL | Status: DC
  Filled 2025-01-05: qty 5

## 2025-01-05 MED ORDER — LORATADINE 10 MG PO TABS: 10.0000 mg | ORAL_TABLET | Freq: Once | ORAL | Status: DC

## 2025-01-05 MED ORDER — EPOETIN ALFA-EPBX 10000 UNIT/ML IJ SOLN: 20000.0000 [IU] | Freq: Once | INTRAMUSCULAR | Status: DC

## 2025-01-05 MED ORDER — DARATUMUMAB-HYALURONIDASE-FIHJ 1800-30000 MG-UT/15ML ~~LOC~~ SOLN
1800.0000 mg | Freq: Once | SUBCUTANEOUS | Status: AC
  Administered 2025-01-05: 1800 mg via SUBCUTANEOUS
  Filled 2025-01-05: qty 15

## 2025-01-05 MED ORDER — HEPARIN SOD (PORK) LOCK FLUSH 100 UNIT/ML IV SOLN: 500.0000 [IU] | Freq: Once | INTRAVENOUS | Status: DC | PRN

## 2025-01-05 NOTE — Progress Notes (Signed)
 " Patient Care Team: Rogers Hai, MD (Inactive) as PCP - General (Hematology)  Clinic Day:  01/05/2025  Referring physician: Rogers Hai, MD   CHIEF COMPLAINT:  CC: Relapsed IgG kappa multiple myeloma   ASSESSMENT & PLAN:   Assessment & Plan: Johnny Lucas  is a 79 y.o. male with multiple myeloma  Assessment and Plan Assessment & Plan  Relapsed multiple myeloma  Initial diagnosis in 2019 followed by Ortho stem cell transplant.  Extensive oncology history below Currently on dexamethasone  and daratumumab  maintenance.  - Patient tolerating treatment well with no side effects. - Labs reviewed today: CMP: Creatinine: 1.72- slightly worsened, ALP:137,normal LFTs, CBC: Hemoglobin: 10.7, platelets: 180, WBC: 6.1.  Multiple myeloma labs: M spike: Not observed, normal free light chains with a normal ratio. -Physical exam stable today.  Will proceed with treatment today. - Continue daratumumab  and dexamethasone  monthly - Continue acyclovir  for viral prophylaxis - Pomalidomide  and denosumab  are on hold for oral fistula -Considering worsening renal function, anemia and elevated alkaline phosphatase, will repeat labs sooner  Return to clinic in 8 weeks with labs.  Multiple myeloma bone disease Patient previously on denosumab .  Held for oral antral fistula.  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  Patient reports no complaints today.  Macrocytic anemia Likely secondary to vitamin B12 deficiency - Start over the counter vitamin B12 1000 mcg daily.  Peripheral neuropathy Stable today.  - Continue gabapentin  600 mg 3 times daily  Hypogammaglobulinemia Started on IVIG 04/27/2024.  Current IgG is 932.  - Can hold IVIG at this time.  Will restart if IgG is low   The patient understands the plans discussed today and is in agreement with them.  He knows to contact our office if he develops concerns prior to his next  appointment.  The total time spent in the appointment was 25 minutes for the encounter with patient, including review of chart and various tests results, discussions about plan of care and coordination of care plan   Mickiel Dry, MD  [facility] CANCER CTR [place] - A DEPT OF [facility] [address] [name] Dept: [phone] Dept Fax: [phone]   Orders Placed This Encounter  Procedures   IgG, IgA, IgM    Standing Status:   Future    Expected Date:   02/01/2025    Expiration Date:   05/02/2025   Gamma GT    Standing Status:   Future    Expected Date:   02/01/2025    Expiration Date:   05/02/2025     ONCOLOGY HISTORY:   I have reviewed his chart and materials related to his cancer extensively and collaborated history with the patient. Summary of oncologic history is as follows:   Diagnosis: Relapsed IgG kappa multiple myeloma   -Patient has a history of stage IV left breast cancer (2011 diagnosis s/p tamoxifen  and denosumab ).  -12/31/2016: PET:  Multifocal hypermetabolic osseous metastases throughout the axial and proximal appendicular skeleton, which are increased in size, number and metabolism. 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 No additional sites of hypermetabolic metastatic disease. -98/83/7981: Left ilium bone biopsy.  Pathology: Plasma cell neoplasm. Sections of bone show marrow space essentially replaced by sheets of atypical plasma cells. The cells stain for CD138 and are monotypic for kappa light chain by in situ hybridization. Pancytokeratin  is negative. -01/18/2017: SPEP: M-spike 2.3. Gamma Globulin 2.7. KFLC elevated at 2,129.1 with FLC ratio of 272.96. IgG 3,234 with immunofixation showing IgG monoclonal protein with kappa light chain specificity. Beta-2  Microglobulin 3.3. Calcium   8.7. Total Protein 8.4. HGB 11.1.  -02/02/2017: Bone Marrow Biopsy.  Pathology: The marrow is variably cellular with large peritrabecular aggregates of kappa restricted plasma cells (66% by aspirate, 30% by CD138). These findings are consistent with plasma cell myeloma.  Chromosome Analysis: Normal male karyotype MM FISH: Gain of ATM.  Negative gain/loss of 13 q., gain/loss of 12, TP 53, CCND1/IgH. -03/01/2017-06/29/2017: 6 cycles of RVD -05/26/2017: Bone marrow biopsy at [facility]:  Plasma cell myeloma in a 30% cellular marrow with decreased trilineage hematopoiesis and 42% kappa light chain restricted plasma cells on the aspirate smears and large aggregates on the core biopsy.   -07/12/2017-08/31/2017: 3 cycles of KCD -10/08/2017: Autologous stem cell transplant  -11/02/2017: MM Panel: M-spike 0.3 -01/19/2018: Bone Marrow Biopsy.  Pathology: Normocellular marrow (20-30%) with a slight increase in plasma cells (2%).  -02/14/2018-03/29/2018: Post transplant consolidation with 2 cycles of KCD -04/20/2018-02/06/2020: Maintenance carfilzomib  70 mg/m2 days 1 and 15 every 28 days, dose reduced to 34 mg/m2 on 01/05/2019, dose increased to 58 mg/m2 on 01/09/2020 -01/25/2020: Excision of left anterior axillary mass. Pathology: Consistent with multiple myeloma, Kappa-restricted.  -02/12/2020: PET: New medial left supraclavicular node at the thoracic inlet, suspicious for nodal metastasis. Marked improvement in osseous metastases. -02/14/2020: Bone Marrow Biopsy.  Pathology: Plasma cell myeloma (60-70% by CD138), kappa light chain restricted, recurrent. Flow confirms Kappa monotypic plasma cells.  Chromosome Analysis: Normal MM FISH Panel: Negative -02/28/2020-Current:Daratumumab , bortezomib  and dexamethasone  completed -06/2020: Pomalidomide  dose reduced to 2 mg days 1-21 secondary to severe weakness -03/2024: Pomalidomide  held as patient developed an oroantral fistula -Currently on daratumumab  and  dexamethasone  maintenance -2021-current: Patient's myeloma remains stable with M-spike ranging from 0.1 to not observed and FLC ratio normalized -02/16/2024-04/26/2024: Pomalidomide  and Darzalex  held due to hospitalization, right partial maxillectomy and extraction of remaining right maxillary molar, first premolar and canine, requiring prolonged antibiotics    Current Treatment:  Daratumumab  and dexamethasone  maintenance monthly  INTERVAL HISTORY:   Discussed the use of AI scribe software for clinical note transcription with the patient, who gave verbal consent to proceed.  History of Present Illness Johnny Lucas is a 79 year old male with multiple myeloma presents for routine hematology/oncology follow-up and laboratory monitoring during ongoing myeloma therapy.  He is undergoing regular surveillance for multiple myeloma and closely monitors his laboratory results. He expressed concern regarding a delay in M spike reporting and noted that there was no M spike on recent testing. He continues monthly myeloma therapy. He also maintains biannual follow-up at [facility] for ongoing surveillance and care coordination.  He denies weight loss, fatigue, fevers, chills, or sweats.  I have reviewed the past medical history, past surgical history, social history and family history with the patient and they are unchanged from previous note.  ALLERGIES:  is allergic to cefepime .  MEDICATIONS:  Current Outpatient Medications  Medication Sig Dispense Refill   acetaminophen  (TYLENOL ) 500 MG tablet Take 1,000 mg by mouth every 8 (eight) hours as needed for mild pain (pain score 1-3) or moderate pain (pain score 4-6).     acyclovir  (ZOVIRAX ) 800 MG tablet TAKE 1 TABLET TWICE DAILY 180 tablet 3   albuterol  (VENTOLIN  HFA) 108 (90 Base) MCG/ACT inhaler Inhale 2 puffs into the lungs every 6 (six) hours as needed for wheezing or  shortness of breath.     ALPRAZolam  (XANAX ) 0.5 MG tablet TAKE 1 TABLET BY  MOUTH AT BEDTIME AS NEEDED FOR anxiety / SLEEP 30 tablet 5   aspirin  EC 81 MG tablet Take 81 mg by mouth daily.      cetirizine  (ZYRTEC ) 10 MG tablet Take 10 mg by mouth daily.     chlorhexidine  (PERIDEX ) 0.12 % solution Swish and spit after meals and before bed (Total 4 times daily) for 10 days. 473 mL 1   DAPTOmycin  (CUBICIN ) 500 MG injection Inject into the vein.     dexamethasone  (DECADRON ) 4 MG tablet Take 5 tablets (20 mg total) by mouth every 30 (thirty) days. 5 tablet 6   folic acid  (FOLVITE ) 1 MG tablet Take 1 tablet (1 mg total) by mouth daily. 90 tablet 3   gabapentin  (NEURONTIN ) 300 MG capsule Take 1 capsule (300 mg total) by mouth 3 (three) times daily. 180 capsule 2   metoprolol  succinate (TOPROL -XL) 100 MG 24 hr tablet Take 100 mg by mouth in the morning.     mupirocin  ointment (BACTROBAN ) 2 % Apply topically.     pantoprazole  (PROTONIX ) 40 MG tablet TAKE 1 TABLET EVERY OTHER DAY 45 tablet 3   pentoxifylline (TRENTAL) 400 MG CR tablet Take 400 mg by mouth.     POMALYST  2 MG capsule Take 1 capsule (2 mg total) by mouth daily. 21 capsule 0   SF 5000 PLUS 1.1 % CREA dental cream Take by mouth at bedtime.     tamoxifen  (NOLVADEX ) 20 MG tablet TAKE 1 TABLET EVERY DAY 90 tablet 3   No current facility-administered medications for this visit.   Facility-Administered Medications Ordered in Other Visits  Medication Dose Route Frequency Provider Last Rate Last Admin   ondansetron  (ZOFRAN ) 8 mg in sodium chloride  0.9 % 50 mL IVPB  8 mg Intravenous Once Lockamy, Randi L, NP-C       sodium chloride  flush (NS) 0.9 % injection 10 mL  10 mL Intracatheter Once PRN Rogers Hai, MD        VITALS:  Weight 188 lb 9.6 oz (85.5 kg).  Wt Readings from Last 3 Encounters:  01/05/25 188 lb 9.6 oz (85.5 kg)  11/10/24 192 lb 9.6 oz (87.4 kg)  10/13/24 194 lb 14.2 oz (88.4 kg)    Body mass index is 27.85 kg/m.  Performance status (ECOG): 2 - Symptomatic, <50% confined to bed  PHYSICAL  EXAM:   GENERAL:alert, no distress and comfortable SKIN: skin color, texture, turgor are normal, no rashes or significant lesions LYMPH:  no palpable lymphadenopathy in the cervical, axillary or inguinal LUNGS: clear to auscultation and percussion with normal breathing effort HEART: regular rate & rhythm and no murmurs and no lower extremity edema ABDOMEN:abdomen soft, non-tender and normal bowel sounds Musculoskeletal:no cyanosis of digits and no clubbing  NEURO: alert & oriented x 3 with fluent speech  LABORATORY DATA:  I have reviewed the data as listed   Lab Results  Component Value Date   WBC 6.1 01/05/2025   NEUTROABS 4.0 01/05/2025   HGB 10.7 (L) 01/05/2025   HCT 32.2 (L) 01/05/2025   MCV 103.5 (H) 01/05/2025   PLT 180 01/05/2025     Chemistry      Component Value Date/Time   NA 139 01/05/2025 0803   K 4.3 01/05/2025 0803   CL 103 01/05/2025 0803   CO2 23 01/05/2025 0803   BUN 17 01/05/2025 0803   CREATININE 1.72 (H) 01/05/2025 0803  Component Value Date/Time   CALCIUM  9.6 01/05/2025 0803   ALKPHOS 49 01/05/2025 0803   AST 73 (H) 01/05/2025 0803   ALT 138 (H) 01/05/2025 0803   BILITOT 0.5 01/05/2025 0803       Latest Reference Range & Units 12/26/24 11:44  Total Protein ELP 6.0 - 8.5 g/dL 5.8 (L)  Albumin  ELP 2.9 - 4.4 g/dL 3.2  Globulin, Total 2.2 - 3.9 g/dL 2.6 (C)  A/G Ratio 0.7 - 1.7  1.2 (C)  Alpha-1-Globulin 0.0 - 0.4 g/dL 0.3  Johnny Lucas 0.4 - 1.0 g/dL 0.8  Beta Globulin 0.7 - 1.3 g/dL 0.9  Gamma Globulin 0.4 - 1.8 g/dL 0.6  M-SPIKE, % Not Observed g/dL Not Observed  SPE Interp.  Comment  Comment  Comment  (L): Data is abnormally low (C): Corrected (C): Corrected   Latest Reference Range & Units 12/08/24 09:29  Kappa free light chain 3.3 - 19.4 mg/L 2.0 (L)  Lambda free light chains 5.7 - 26.3 mg/L 2.2 (L)  Kappa, lambda light chain ratio 0.26 - 1.65  0.91  (L): Data is abnormally low  RADIOGRAPHIC STUDIES: I have personally  reviewed the radiological images as listed and agreed with the findings in the report.  "

## 2025-01-05 NOTE — Progress Notes (Signed)
 Patient presents today for IVIG/Daratumumab  injection per providers order.  Vital signs and labs reviewed by MD.  Per Dr. Davonna patient okay for treatment.  Treatment given today per MD orders.  Stable during infusion without adverse affects.  Daratumumab  administration without incident; injection site WNL; see MAR for injection details.  Vital signs stable.  No complaints at this time.  Discharge from clinic ambulatory in stable condition.  Alert and oriented X 3.  Follow up with [facility] as scheduled.

## 2025-01-05 NOTE — Patient Instructions (Signed)
 CH CANCER CTR [place] - A DEPT OF [place]  Discharge Instructions: Thank you for choosing [facility] to provide your oncology and hematology care.  If you have a lab appointment with the Cancer Center - please note that after April 8th, 2024, all labs will be drawn in the cancer center.  You do not have to check in or register with the main entrance as you have in the past but will complete your check-in in the cancer center.  Wear comfortable clothing and clothing appropriate for easy access to any Portacath or PICC line.   We strive to give you quality time with your provider. You may need to reschedule your appointment if you arrive late (15 or more minutes).  Arriving late affects you and other patients whose appointments are after yours.  Also, if you miss three or more appointments without notifying the office, you may be dismissed from the clinic at the providers discretion.      For prescription refill requests, have your pharmacy contact our office and allow 72 hours for refills to be completed.    Today you received the following chemotherapy and/or immunotherapy agents IVIG/Daratumumab       To help prevent nausea and vomiting after your treatment, we encourage you to take your nausea medication as directed.  BELOW ARE SYMPTOMS THAT SHOULD BE REPORTED IMMEDIATELY: *FEVER GREATER THAN 100.4 F (38 C) OR HIGHER *CHILLS OR SWEATING *NAUSEA AND VOMITING THAT IS NOT CONTROLLED WITH YOUR NAUSEA MEDICATION *UNUSUAL SHORTNESS OF BREATH *UNUSUAL BRUISING OR BLEEDING *URINARY PROBLEMS (pain or burning when urinating, or frequent urination) *BOWEL PROBLEMS (unusual diarrhea, constipation, pain near the anus) TENDERNESS IN MOUTH AND THROAT WITH OR WITHOUT PRESENCE OF ULCERS (sore throat, sores in mouth, or a toothache) UNUSUAL RASH, SWELLING OR PAIN  UNUSUAL VAGINAL DISCHARGE OR ITCHING   Items with * indicate a potential emergency and should be  followed up as soon as possible or go to the Emergency Department if any problems should occur.  Please show the CHEMOTHERAPY ALERT CARD or IMMUNOTHERAPY ALERT CARD at check-in to the Emergency Department and triage nurse.  Should you have questions after your visit or need to cancel or reschedule your appointment, please contact [facility] CANCER CTR [place] - A DEPT OF [facility] [phone]  and follow the prompts.  Office hours are 8:00 a.m. to 4:30 p.m. Monday - Friday. Please note that voicemails left after 4:00 p.m. may not be returned until the following business day.  We are closed weekends and major holidays. You have access to a nurse at all times for urgent questions. Please call the main number to the clinic [phone] and follow the prompts.  For any non-urgent questions, you may also contact your provider using MyChart. We now offer e-Visits for anyone 28 and older to request care online for non-urgent symptoms. For details visit [email or web address].   Also download the MyChart app! Go to the app store, search MyChart, open the app, select , and log in with your MyChart username and password.

## 2025-01-15 ENCOUNTER — Other Ambulatory Visit: Payer: Self-pay

## 2025-01-15 DIAGNOSIS — C9000 Multiple myeloma not having achieved remission: Secondary | ICD-10-CM

## 2025-01-15 MED ORDER — ACYCLOVIR 800 MG PO TABS: 800.0000 mg | ORAL_TABLET | Freq: Two times a day (BID) | ORAL | 3 refills | Status: AC

## 2025-01-15 NOTE — Telephone Encounter (Signed)
 Refill request for Acyclovir  800 mg from [facility].  Chart checked and refilled.

## 2025-02-01 ENCOUNTER — Inpatient Hospital Stay

## 2025-02-02 ENCOUNTER — Inpatient Hospital Stay

## 2025-02-02 ENCOUNTER — Encounter: Payer: Self-pay | Admitting: Oncology

## 2025-02-02 ENCOUNTER — Inpatient Hospital Stay: Attending: Hematology

## 2025-02-02 VITALS — BP 134/75 | HR 61 | Temp 98.2°F | Resp 18 | Wt 185.8 lb

## 2025-02-02 DIAGNOSIS — C9000 Multiple myeloma not having achieved remission: Secondary | ICD-10-CM

## 2025-02-02 LAB — CBC WITH DIFFERENTIAL/PLATELET
Abs Immature Granulocytes: 0.01 10*3/uL (ref 0.00–0.07)
Basophils Absolute: 0.1 10*3/uL (ref 0.0–0.1)
Basophils Relative: 1 %
Eosinophils Absolute: 0.1 10*3/uL (ref 0.0–0.5)
Eosinophils Relative: 2 %
HCT: 33.3 % — ABNORMAL LOW (ref 39.0–52.0)
Hemoglobin: 11.1 g/dL — ABNORMAL LOW (ref 13.0–17.0)
Immature Granulocytes: 0 %
Lymphocytes Relative: 21 %
Lymphs Abs: 1.5 10*3/uL (ref 0.7–4.0)
MCH: 34.3 pg — ABNORMAL HIGH (ref 26.0–34.0)
MCHC: 33.3 g/dL (ref 30.0–36.0)
MCV: 102.8 fL — ABNORMAL HIGH (ref 80.0–100.0)
Monocytes Absolute: 0.6 10*3/uL (ref 0.1–1.0)
Monocytes Relative: 9 %
Neutro Abs: 4.6 10*3/uL (ref 1.7–7.7)
Neutrophils Relative %: 67 %
Platelets: 147 10*3/uL — ABNORMAL LOW (ref 150–400)
RBC: 3.24 MIL/uL — ABNORMAL LOW (ref 4.22–5.81)
RDW: 14.6 % (ref 11.5–15.5)
WBC: 6.9 10*3/uL (ref 4.0–10.5)
nRBC: 0 % (ref 0.0–0.2)

## 2025-02-02 LAB — COMPREHENSIVE METABOLIC PANEL WITH GFR
ALT: 13 U/L (ref 0–44)
AST: 23 U/L (ref 15–41)
Albumin: 3.9 g/dL (ref 3.5–5.0)
Alkaline Phosphatase: 49 U/L (ref 38–126)
Anion gap: 11 (ref 5–15)
BUN: 15 mg/dL (ref 8–23)
CO2: 29 mmol/L (ref 22–32)
Calcium: 9.6 mg/dL (ref 8.9–10.3)
Chloride: 103 mmol/L (ref 98–111)
Creatinine, Ser: 1.64 mg/dL — ABNORMAL HIGH (ref 0.61–1.24)
GFR, Estimated: 43 mL/min — ABNORMAL LOW
Glucose, Bld: 93 mg/dL (ref 70–99)
Potassium: 4 mmol/L (ref 3.5–5.1)
Sodium: 143 mmol/L (ref 135–145)
Total Bilirubin: 0.5 mg/dL (ref 0.0–1.2)
Total Protein: 6 g/dL — ABNORMAL LOW (ref 6.5–8.1)

## 2025-02-02 LAB — GAMMA GT: GGT: 23 U/L (ref 7–50)

## 2025-02-02 LAB — MAGNESIUM: Magnesium: 2.1 mg/dL (ref 1.7–2.4)

## 2025-02-02 MED ORDER — DARATUMUMAB-HYALURONIDASE-FIHJ 1800-30000 MG-UT/15ML ~~LOC~~ SOLN
1800.0000 mg | Freq: Once | SUBCUTANEOUS | Status: AC
  Administered 2025-02-02: 1800 mg via SUBCUTANEOUS
  Filled 2025-02-02: qty 15

## 2025-02-02 NOTE — Progress Notes (Signed)
 Patient presents today for chemotherapy Darzalex  Faspro infusion.  Patient is in satisfactory condition with no new complaints voiced.  Vital signs are stable.  Labs reviewed and all labs are within treatment parameters.  We will proceed with treatment per MD orders.    Patient reports taking pre-meds at home prior to arrival.   Treatment given today per MD orders. Tolerated infusion without adverse affects. Vital signs stable. No complaints at this time. Discharged from clinic ambulatory in stable condition. Alert and oriented x 3. F/U with [facility] as scheduled.

## 2025-02-02 NOTE — Patient Instructions (Signed)
 [facility] - A DEPT OF [facility]  Discharge Instructions: Thank you for choosing [facility] to provide your oncology and hematology care.  If you have a lab appointment with the Cancer Center - please note that after April 8th, 2024, all labs will be drawn in the cancer center.  You do not have to check in or register with the main entrance as you have in the past but will complete your check-in in the cancer center.  Wear comfortable clothing and clothing appropriate for easy access to any Portacath or PICC line.   We strive to give you quality time with your provider. You may need to reschedule your appointment if you arrive late (15 or more minutes).  Arriving late affects you and other patients whose appointments are after yours.  Also, if you miss three or more appointments without notifying the office, you may be dismissed from the clinic at the providers discretion.      For prescription refill requests, have your pharmacy contact our office and allow 72 hours for refills to be completed.    Today you received the following chemotherapy and/or immunotherapy agents Darzalex  Faspro       To help prevent nausea and vomiting after your treatment, we encourage you to take your nausea medication as directed.  Daratumumab ; Hyaluronidase  Injection What is this medication? DARATUMUMAB ; HYALURONIDASE  (dar a toom ue mab; hye al ur ON i dase) treats multiple myeloma, a type of bone marrow cancer. Daratumumab  works by blocking a protein that causes cancer cells to grow and multiply. This helps to slow or stop the spread of cancer cells. Hyaluronidase  works by increasing the absorption of other medications in the body to help them work better. This medication may also be used treat amyloidosis, a condition that causes the buildup of a protein (amyloid) in your body. It works by reducing the buildup of this protein, which decreases symptoms. It is a  combination medication that contains a monoclonal antibody. This medicine may be used for other purposes; ask your health care provider or pharmacist if you have questions. COMMON BRAND NAME(S): DARZALEX  FASPRO What should I tell my care team before I take this medication? They need to know if you have any of these conditions: Have had hepatitis Have had shingles Heart disease Infection, such as chickenpox, cold sores, herpes Lung or breathing disease, such as asthma or COPD An unusual or allergic reaction to daratumumab , hyaluronidase , other medications, foods, dyes, or preservatives Pregnant or trying to get pregnant Breastfeeding How should I use this medication? This medication is injected under the skin. It is given by your care team in a hospital or clinic setting. Talk to your care team about the use of this medication in children. Special care may be needed. Overdosage: If you think you have taken too much of this medicine contact a poison control center or emergency room at once. NOTE: This medicine is only for you. Do not share this medicine with others. What if I miss a dose? Keep appointments for follow-up doses. It is important not to miss your dose. Call your care team if you are unable to keep an appointment. What may interact with this medication? Interactions have not been studied. This list may not describe all possible interactions. Give your health care provider a list of all the medicines, herbs, non-prescription drugs, or dietary supplements you use. Also tell them if you smoke, drink alcohol , or use illegal  drugs. Some items may interact with your medicine. What should I watch for while using this medication? Your condition will be monitored carefully while you are receiving this medication. This medication can cause serious allergic reactions. To reduce the risk, your care team may give you other medication to take before receiving this one. Follow the directions from  your care team. This medication may increase your risk of getting an infection. Call your care team for advice if you get a fever, chills, sore throat, or other symptoms of a cold or flu. Do not treat yourself. Try to avoid being around people who are sick. This medication can affect the results of blood tests to match your blood type. These changes can last for up to 6 months after your last dose. Your care team will do blood tests to match your blood type before you start treatment. Tell all of your care teams that you are being treated with this medication before receiving a blood transfusion. Talk to your care team if you may be pregnant. Serious fetal side effects can occur if you take this medication during pregnancy and for 3 months after the last dose. Contraception is recommended while taking this medication and for 3 months after the last dose. Your care team can help you find the option that works for you. Do not breastfeed while taking this medication. What side effects may I notice from receiving this medication? Side effects that you should report to your care team as soon as possible: Allergic reactions--skin rash, itching, hives, swelling of the face, lips, tongue, or throat Heart rhythm changes--fast or irregular heartbeat, dizziness, feeling faint or lightheaded, chest pain, trouble breathing Infection--fever, chills, cough, sore throat, wounds that don't heal, pain or trouble when passing urine, general feeling of discomfort or being unwell Infusion reactions--chest pain, shortness of breath or trouble breathing, feeling faint or lightheaded Sudden eye pain or change in vision such as blurry vision, seeing halos around lights, vision loss Unusual bruising or bleeding Side effects that usually do not require medical attention (report these to your care team if they continue or are bothersome): Constipation Diarrhea Fatigue Nausea Pain, tingling, or numbness in the hands or  feet Swelling of the ankles, hands, or feet This list may not describe all possible side effects. Call your doctor for medical advice about side effects. You may report side effects to FDA at 1-800-FDA-1088. Where should I keep my medication? This medication is given in a hospital or clinic. It will not be stored at home. NOTE: This sheet is a summary. It may not cover all possible information. If you have questions about this medicine, talk to your doctor, pharmacist, or health care provider.  2025 Elsevier/Gold Standard (2024-11-10 00:00:00)  BELOW ARE SYMPTOMS THAT SHOULD BE REPORTED IMMEDIATELY: *FEVER GREATER THAN 100.4 F (38 C) OR HIGHER *CHILLS OR SWEATING *NAUSEA AND VOMITING THAT IS NOT CONTROLLED WITH YOUR NAUSEA MEDICATION *UNUSUAL SHORTNESS OF BREATH *UNUSUAL BRUISING OR BLEEDING *URINARY PROBLEMS (pain or burning when urinating, or frequent urination) *BOWEL PROBLEMS (unusual diarrhea, constipation, pain near the anus) TENDERNESS IN MOUTH AND THROAT WITH OR WITHOUT PRESENCE OF ULCERS (sore throat, sores in mouth, or a toothache) UNUSUAL RASH, SWELLING OR PAIN  UNUSUAL VAGINAL DISCHARGE OR ITCHING   Items with * indicate a potential emergency and should be followed up as soon as possible or go to the Emergency Department if any problems should occur.  Please show the CHEMOTHERAPY ALERT CARD or IMMUNOTHERAPY ALERT CARD at check-in  to the Emergency Department and triage nurse.  Should you have questions after your visit or need to cancel or reschedule your appointment, please contact [facility] [place] - A DEPT OF [facility] [phone]  and follow the prompts.  Office hours are 8:00 a.m. to 4:30 p.m. Monday - Friday. Please note that voicemails left after 4:00 p.m. may not be returned until the following business day.  We are closed weekends and major holidays. You have access to a nurse at all times for urgent questions. Please call the main number to  the clinic [phone] and follow the prompts.  For any non-urgent questions, you may also contact your provider using MyChart. We now offer e-Visits for anyone 43 and older to request care online for non-urgent symptoms. For details visit [email or web address].   Also download the MyChart app! Go to the app store, search MyChart, open the app, select [place], and log in with your MyChart username and password.

## 2025-03-01 ENCOUNTER — Inpatient Hospital Stay: Attending: Hematology

## 2025-03-01 ENCOUNTER — Inpatient Hospital Stay: Admitting: Oncology

## 2025-03-01 ENCOUNTER — Inpatient Hospital Stay
